# Patient Record
Sex: Male | Born: 1965
Health system: Southern US, Community
[De-identification: ages and names within clinical notes are randomized; demographics above are authoritative.]

## PROBLEM LIST (undated history)

## (undated) DIAGNOSIS — I10 Essential (primary) hypertension: Secondary | ICD-10-CM

## (undated) DIAGNOSIS — K754 Autoimmune hepatitis: Secondary | ICD-10-CM

## (undated) DIAGNOSIS — K603 Anal fistula, unspecified: Secondary | ICD-10-CM

## (undated) DIAGNOSIS — F339 Major depressive disorder, recurrent, unspecified: Secondary | ICD-10-CM

## (undated) DIAGNOSIS — R011 Cardiac murmur, unspecified: Secondary | ICD-10-CM

## (undated) DIAGNOSIS — Z87820 Personal history of traumatic brain injury: Secondary | ICD-10-CM

## (undated) DIAGNOSIS — I81 Portal vein thrombosis: Secondary | ICD-10-CM

## (undated) DIAGNOSIS — Z85828 Personal history of other malignant neoplasm of skin: Secondary | ICD-10-CM

## (undated) DIAGNOSIS — C4440 Unspecified malignant neoplasm of skin of scalp and neck: Secondary | ICD-10-CM

## (undated) DIAGNOSIS — Z860100 Personal history of colon polyps, unspecified: Secondary | ICD-10-CM

## (undated) DIAGNOSIS — L111 Transient acantholytic dermatosis [Grover]: Secondary | ICD-10-CM

## (undated) DIAGNOSIS — M8580 Other specified disorders of bone density and structure, unspecified site: Secondary | ICD-10-CM

## (undated) DIAGNOSIS — F5082 Avoidant/restrictive food intake disorder: Secondary | ICD-10-CM

## (undated) DIAGNOSIS — E531 Pyridoxine deficiency: Secondary | ICD-10-CM

## (undated) DIAGNOSIS — IMO0001 Reserved for inherently not codable concepts without codable children: Secondary | ICD-10-CM

## (undated) DIAGNOSIS — K8301 Primary sclerosing cholangitis: Secondary | ICD-10-CM

## (undated) DIAGNOSIS — K551 Chronic vascular disorders of intestine: Secondary | ICD-10-CM

## (undated) DIAGNOSIS — K9049 Malabsorption due to intolerance, not elsewhere classified: Secondary | ICD-10-CM

## (undated) DIAGNOSIS — J302 Other seasonal allergic rhinitis: Secondary | ICD-10-CM

## (undated) DIAGNOSIS — F1911 Other psychoactive substance abuse, in remission: Secondary | ICD-10-CM

## (undated) DIAGNOSIS — E7800 Pure hypercholesterolemia, unspecified: Secondary | ICD-10-CM

## (undated) DIAGNOSIS — M1990 Unspecified osteoarthritis, unspecified site: Secondary | ICD-10-CM

## (undated) DIAGNOSIS — K591 Functional diarrhea: Secondary | ICD-10-CM

## (undated) DIAGNOSIS — T7840XA Allergy, unspecified, initial encounter: Secondary | ICD-10-CM

## (undated) DIAGNOSIS — K5080 Crohn's disease of both small and large intestine without complications: Secondary | ICD-10-CM

## (undated) DIAGNOSIS — F1910 Other psychoactive substance abuse, uncomplicated: Secondary | ICD-10-CM

## (undated) DIAGNOSIS — G894 Chronic pain syndrome: Secondary | ICD-10-CM

## (undated) DIAGNOSIS — F419 Anxiety disorder, unspecified: Secondary | ICD-10-CM

## (undated) DIAGNOSIS — E509 Vitamin A deficiency, unspecified: Secondary | ICD-10-CM

## (undated) DIAGNOSIS — Z915 Personal history of self-harm: Secondary | ICD-10-CM

## (undated) DIAGNOSIS — K909 Intestinal malabsorption, unspecified: Secondary | ICD-10-CM

## (undated) DIAGNOSIS — D500 Iron deficiency anemia secondary to blood loss (chronic): Secondary | ICD-10-CM

## (undated) DIAGNOSIS — H269 Unspecified cataract: Secondary | ICD-10-CM

## (undated) DIAGNOSIS — Z8489 Family history of other specified conditions: Secondary | ICD-10-CM

## (undated) DIAGNOSIS — Z9151 Personal history of suicidal behavior: Secondary | ICD-10-CM

## (undated) DIAGNOSIS — Z9889 Other specified postprocedural states: Secondary | ICD-10-CM

## (undated) DIAGNOSIS — F1011 Alcohol abuse, in remission: Secondary | ICD-10-CM

## (undated) DIAGNOSIS — E54 Ascorbic acid deficiency: Secondary | ICD-10-CM

## (undated) HISTORY — PX: PILONIDAL CYST EXCISION: SHX744

## (undated) HISTORY — DX: Autoimmune hepatitis: K75.4

## (undated) HISTORY — DX: Other specified disorders of bone density and structure, unspecified site: M85.80

## (undated) HISTORY — PX: UPPER GASTROINTESTINAL ENDOSCOPY: SHX188

## (undated) HISTORY — DX: Other psychoactive substance abuse, in remission: F19.11

## (undated) HISTORY — DX: Crohn's disease of both small and large intestine without complications: K50.80

## (undated) HISTORY — DX: Other psychoactive substance abuse, uncomplicated: F19.10

## (undated) HISTORY — DX: Unspecified cataract: H26.9

## (undated) HISTORY — DX: Unspecified osteoarthritis, unspecified site: M19.90

## (undated) HISTORY — DX: Alcohol abuse, in remission: F10.11

## (undated) HISTORY — DX: Transient acantholytic dermatosis (grover): L11.1

## (undated) HISTORY — DX: Unspecified malignant neoplasm of skin of scalp and neck: C44.40

## (undated) HISTORY — DX: Ascorbic acid deficiency: E54

## (undated) HISTORY — PX: COLONOSCOPY: SHX174

## (undated) HISTORY — DX: Avoidant/restrictive food intake disorder: F50.82

## (undated) HISTORY — DX: Primary sclerosing cholangitis: K83.01

## (undated) HISTORY — DX: Personal history of colonic polyps: Z86.010

## (undated) HISTORY — DX: Pure hypercholesterolemia, unspecified: E78.00

## (undated) HISTORY — DX: Personal history of colon polyps, unspecified: Z86.0100

## (undated) HISTORY — PX: ADENOIDECTOMY: SHX5191

## (undated) HISTORY — DX: Portal vein thrombosis: I81

## (undated) HISTORY — DX: Chronic pain syndrome: G89.4

## (undated) HISTORY — DX: Allergy, unspecified, initial encounter: T78.40XA

## (undated) HISTORY — DX: Essential (primary) hypertension: I10

## (undated) HISTORY — PX: PERCUTANEOUS LIVER BIOPSY: SUR136

## (undated) HISTORY — DX: Anxiety disorder, unspecified: F41.9

## (undated) HISTORY — PX: FOOT SURGERY: SHX648

## (undated) HISTORY — DX: Vitamin a deficiency, unspecified: E50.9

## (undated) SURGERY — EGD (ESOPHAGOGASTRODUODENOSCOPY)
Anesthesia: Monitor Anesthesia Care

---
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
## 1995-03-02 DIAGNOSIS — F1910 Other psychoactive substance abuse, uncomplicated: Secondary | ICD-10-CM

## 1995-03-02 HISTORY — DX: Other psychoactive substance abuse, uncomplicated: F19.10

## 1999-10-13 ENCOUNTER — Encounter: Payer: Self-pay | Admitting: Gastroenterology

## 1999-10-13 ENCOUNTER — Ambulatory Visit (HOSPITAL_COMMUNITY): Admission: RE | Admit: 1999-10-13 | Discharge: 1999-10-13 | Payer: Self-pay | Admitting: Gastroenterology

## 1999-10-20 ENCOUNTER — Other Ambulatory Visit: Admission: RE | Admit: 1999-10-20 | Discharge: 1999-10-20 | Payer: Self-pay | Admitting: Gastroenterology

## 1999-10-20 ENCOUNTER — Encounter (INDEPENDENT_AMBULATORY_CARE_PROVIDER_SITE_OTHER): Payer: Self-pay

## 1999-11-13 ENCOUNTER — Encounter: Payer: Self-pay | Admitting: Gastroenterology

## 1999-11-13 ENCOUNTER — Ambulatory Visit (HOSPITAL_COMMUNITY): Admission: RE | Admit: 1999-11-13 | Discharge: 1999-11-13 | Payer: Self-pay | Admitting: Gastroenterology

## 2003-05-02 ENCOUNTER — Encounter (INDEPENDENT_AMBULATORY_CARE_PROVIDER_SITE_OTHER): Payer: Self-pay | Admitting: Gastroenterology

## 2004-06-22 ENCOUNTER — Ambulatory Visit: Payer: Self-pay | Admitting: Gastroenterology

## 2004-06-24 ENCOUNTER — Ambulatory Visit: Payer: Self-pay | Admitting: Gastroenterology

## 2004-07-08 ENCOUNTER — Ambulatory Visit: Payer: Self-pay | Admitting: Gastroenterology

## 2004-12-28 ENCOUNTER — Ambulatory Visit: Payer: Self-pay | Admitting: Gastroenterology

## 2005-01-06 ENCOUNTER — Ambulatory Visit: Payer: Self-pay | Admitting: Gastroenterology

## 2005-04-12 ENCOUNTER — Ambulatory Visit: Payer: Self-pay | Admitting: Gastroenterology

## 2005-04-14 ENCOUNTER — Ambulatory Visit: Payer: Self-pay | Admitting: Gastroenterology

## 2005-04-15 ENCOUNTER — Encounter (INDEPENDENT_AMBULATORY_CARE_PROVIDER_SITE_OTHER): Payer: Self-pay | Admitting: *Deleted

## 2005-04-15 ENCOUNTER — Ambulatory Visit (HOSPITAL_COMMUNITY): Admission: RE | Admit: 2005-04-15 | Discharge: 2005-04-15 | Payer: Self-pay | Admitting: Gastroenterology

## 2005-04-22 ENCOUNTER — Ambulatory Visit: Payer: Self-pay | Admitting: Gastroenterology

## 2005-04-26 ENCOUNTER — Ambulatory Visit: Payer: Self-pay | Admitting: Internal Medicine

## 2005-04-27 ENCOUNTER — Ambulatory Visit: Payer: Self-pay | Admitting: Internal Medicine

## 2005-05-06 ENCOUNTER — Ambulatory Visit: Payer: Self-pay | Admitting: Internal Medicine

## 2005-05-11 ENCOUNTER — Encounter (INDEPENDENT_AMBULATORY_CARE_PROVIDER_SITE_OTHER): Payer: Self-pay | Admitting: *Deleted

## 2005-05-11 ENCOUNTER — Ambulatory Visit (HOSPITAL_COMMUNITY): Admission: RE | Admit: 2005-05-11 | Discharge: 2005-05-11 | Payer: Self-pay | Admitting: Internal Medicine

## 2005-05-11 ENCOUNTER — Encounter: Payer: Self-pay | Admitting: Internal Medicine

## 2005-05-19 ENCOUNTER — Ambulatory Visit: Payer: Self-pay | Admitting: Internal Medicine

## 2005-06-10 ENCOUNTER — Ambulatory Visit: Payer: Self-pay | Admitting: Internal Medicine

## 2005-07-01 ENCOUNTER — Ambulatory Visit: Payer: Self-pay | Admitting: Internal Medicine

## 2005-08-17 ENCOUNTER — Ambulatory Visit: Payer: Self-pay | Admitting: Internal Medicine

## 2005-08-25 ENCOUNTER — Ambulatory Visit: Payer: Self-pay | Admitting: Internal Medicine

## 2005-09-08 ENCOUNTER — Ambulatory Visit: Payer: Self-pay | Admitting: Internal Medicine

## 2005-10-07 ENCOUNTER — Ambulatory Visit: Payer: Self-pay | Admitting: Internal Medicine

## 2005-11-10 ENCOUNTER — Ambulatory Visit: Payer: Self-pay | Admitting: Internal Medicine

## 2005-12-10 ENCOUNTER — Ambulatory Visit: Payer: Self-pay | Admitting: Internal Medicine

## 2005-12-10 LAB — CONVERTED CEMR LAB
Albumin: 3.3 g/dL — ABNORMAL LOW (ref 3.5–5.2)
Alkaline Phosphatase: 131 units/L — ABNORMAL HIGH (ref 39–117)
Basophils Relative: 0 % (ref 0.0–1.0)
HCT: 43.3 % (ref 39.0–52.0)
Hemoglobin: 14.7 g/dL (ref 13.0–17.0)
Lymphocytes Relative: 17 % (ref 12.0–46.0)
Monocytes Relative: 3 % (ref 3.0–11.0)
Neutrophils Relative %: 80 % — ABNORMAL HIGH (ref 43.0–77.0)
Total Bilirubin: 1.1 mg/dL (ref 0.3–1.2)

## 2005-12-23 ENCOUNTER — Ambulatory Visit: Payer: Self-pay | Admitting: Internal Medicine

## 2006-01-27 ENCOUNTER — Ambulatory Visit: Payer: Self-pay | Admitting: Internal Medicine

## 2006-01-27 LAB — CONVERTED CEMR LAB
Albumin: 3.4 g/dL — ABNORMAL LOW (ref 3.5–5.2)
Basophils Absolute: 0 10*3/uL (ref 0.0–0.1)
Bilirubin, Direct: 0.1 mg/dL (ref 0.0–0.3)
Eosinophil percent: 1.2 % (ref 0.0–5.0)
HCT: 44 % (ref 39.0–52.0)
Hemoglobin: 15.2 g/dL (ref 13.0–17.0)
Lymphocytes Relative: 17.9 % (ref 12.0–46.0)
MCHC: 34.5 g/dL (ref 30.0–36.0)
MCV: 90.3 fL (ref 78.0–100.0)
Monocytes Absolute: 0.7 10*3/uL (ref 0.2–0.7)
Neutro Abs: 6.3 10*3/uL (ref 1.4–7.7)
Neutrophils Relative %: 73 % (ref 43.0–77.0)
RBC: 4.87 M/uL (ref 4.22–5.81)

## 2006-02-17 ENCOUNTER — Ambulatory Visit: Payer: Self-pay | Admitting: Internal Medicine

## 2006-02-17 LAB — CONVERTED CEMR LAB
ALT: 82 units/L — ABNORMAL HIGH (ref 0–40)
AST: 40 units/L — ABNORMAL HIGH (ref 0–37)
Bilirubin, Direct: 0.1 mg/dL (ref 0.0–0.3)
HCT: 45.4 % (ref 39.0–52.0)
MCHC: 33.8 g/dL (ref 30.0–36.0)
MCV: 92 fL (ref 78.0–100.0)
Total Bilirubin: 1.4 mg/dL — ABNORMAL HIGH (ref 0.3–1.2)
WBC: 6.1 10*3/uL (ref 4.5–10.5)

## 2006-03-25 ENCOUNTER — Ambulatory Visit: Payer: Self-pay | Admitting: Internal Medicine

## 2006-03-25 LAB — CONVERTED CEMR LAB
ALT: 67 units/L — ABNORMAL HIGH (ref 0–40)
AST: 36 units/L (ref 0–37)
Albumin: 3.3 g/dL — ABNORMAL LOW (ref 3.5–5.2)
Bilirubin, Direct: 0.1 mg/dL (ref 0.0–0.3)
MCHC: 34.2 g/dL (ref 30.0–36.0)
MCV: 91.6 fL (ref 78.0–100.0)
RBC: 5.01 M/uL (ref 4.22–5.81)
RDW: 13.6 % (ref 11.5–14.6)
WBC: 5.5 10*3/uL (ref 4.5–10.5)

## 2006-05-02 ENCOUNTER — Ambulatory Visit: Payer: Self-pay | Admitting: Internal Medicine

## 2006-05-02 LAB — CONVERTED CEMR LAB
Basophils Relative: 0.7 % (ref 0.0–1.0)
Bilirubin, Direct: 0.1 mg/dL (ref 0.0–0.3)
Eosinophils Absolute: 0.1 10*3/uL (ref 0.0–0.6)
Eosinophils Relative: 2 % (ref 0.0–5.0)
Hemoglobin: 13.6 g/dL (ref 13.0–17.0)
Lymphocytes Relative: 30.9 % (ref 12.0–46.0)
MCV: 91.7 fL (ref 78.0–100.0)
Monocytes Absolute: 0.5 10*3/uL (ref 0.2–0.7)
Neutro Abs: 3.5 10*3/uL (ref 1.4–7.7)
Platelets: 290 10*3/uL (ref 150–400)
Total Bilirubin: 0.8 mg/dL (ref 0.3–1.2)
Total Protein: 6.1 g/dL (ref 6.0–8.3)
WBC: 6 10*3/uL (ref 4.5–10.5)

## 2006-05-05 ENCOUNTER — Ambulatory Visit: Payer: Self-pay | Admitting: Internal Medicine

## 2006-05-16 ENCOUNTER — Ambulatory Visit: Payer: Self-pay | Admitting: Internal Medicine

## 2006-05-16 LAB — CONVERTED CEMR LAB
AST: 116 units/L — ABNORMAL HIGH (ref 0–37)
Albumin: 3.2 g/dL — ABNORMAL LOW (ref 3.5–5.2)
Basophils Absolute: 0 10*3/uL (ref 0.0–0.1)
Basophils Relative: 0.4 % (ref 0.0–1.0)
CO2: 32 meq/L (ref 19–32)
Chloride: 102 meq/L (ref 96–112)
Creatinine, Ser: 1.1 mg/dL (ref 0.4–1.5)
Eosinophils Relative: 3.6 % (ref 0.0–5.0)
Glucose, Bld: 84 mg/dL (ref 70–99)
HCT: 42.4 % (ref 39.0–52.0)
Hemoglobin: 15 g/dL (ref 13.0–17.0)
Monocytes Absolute: 0.4 10*3/uL (ref 0.2–0.7)
Neutrophils Relative %: 55.1 % (ref 43.0–77.0)
Potassium: 3.7 meq/L (ref 3.5–5.1)
RBC: 4.68 M/uL (ref 4.22–5.81)
RDW: 13.2 % (ref 11.5–14.6)
Sodium: 141 meq/L (ref 135–145)
Total Bilirubin: 1.1 mg/dL (ref 0.3–1.2)
Total Protein: 6.3 g/dL (ref 6.0–8.3)
WBC: 4.7 10*3/uL (ref 4.5–10.5)

## 2006-05-31 ENCOUNTER — Ambulatory Visit: Payer: Self-pay | Admitting: Internal Medicine

## 2006-06-09 ENCOUNTER — Ambulatory Visit: Payer: Self-pay | Admitting: Internal Medicine

## 2006-06-09 LAB — CONVERTED CEMR LAB
Albumin: 3 g/dL — ABNORMAL LOW (ref 3.5–5.2)
Total Bilirubin: 0.9 mg/dL (ref 0.3–1.2)
Total Protein: 6.2 g/dL (ref 6.0–8.3)

## 2006-07-15 ENCOUNTER — Ambulatory Visit: Payer: Self-pay | Admitting: Internal Medicine

## 2006-07-15 LAB — CONVERTED CEMR LAB
ALT: 277 units/L — ABNORMAL HIGH (ref 0–40)
Albumin: 3.2 g/dL — ABNORMAL LOW (ref 3.5–5.2)
Alkaline Phosphatase: 59 units/L (ref 39–117)
Total Bilirubin: 1 mg/dL (ref 0.3–1.2)

## 2006-08-19 ENCOUNTER — Ambulatory Visit: Payer: Self-pay | Admitting: Internal Medicine

## 2006-08-19 LAB — CONVERTED CEMR LAB
ALT: 933 units/L — ABNORMAL HIGH (ref 0–40)
AST: 309 units/L — ABNORMAL HIGH (ref 0–37)
Alkaline Phosphatase: 50 units/L (ref 39–117)

## 2006-08-31 ENCOUNTER — Encounter (INDEPENDENT_AMBULATORY_CARE_PROVIDER_SITE_OTHER): Payer: Self-pay | Admitting: Interventional Radiology

## 2006-08-31 ENCOUNTER — Encounter: Payer: Self-pay | Admitting: Internal Medicine

## 2006-08-31 ENCOUNTER — Ambulatory Visit (HOSPITAL_COMMUNITY): Admission: RE | Admit: 2006-08-31 | Discharge: 2006-08-31 | Payer: Self-pay | Admitting: Internal Medicine

## 2006-09-16 ENCOUNTER — Ambulatory Visit: Payer: Self-pay | Admitting: Internal Medicine

## 2006-09-16 LAB — CONVERTED CEMR LAB
ALT: 2082 units/L — ABNORMAL HIGH (ref 0–53)
AST: 730 units/L — ABNORMAL HIGH (ref 0–37)
Albumin: 2.9 g/dL — ABNORMAL LOW (ref 3.5–5.2)
Alkaline Phosphatase: 60 units/L (ref 39–117)
Total Bilirubin: 1.3 mg/dL — ABNORMAL HIGH (ref 0.3–1.2)

## 2006-09-20 ENCOUNTER — Ambulatory Visit: Payer: Self-pay | Admitting: Internal Medicine

## 2006-09-20 LAB — CONVERTED CEMR LAB
ALT: 2563 units/L — ABNORMAL HIGH (ref 0–53)
AST: 845 units/L — ABNORMAL HIGH (ref 0–37)
Alkaline Phosphatase: 75 units/L (ref 39–117)
Beta Globulin: 5.4 % (ref 4.7–7.2)
Bilirubin, Direct: 0.1 mg/dL (ref 0.0–0.3)
Gamma Globulin: 17 % (ref 11.1–18.8)
INR: 1 (ref 0.9–2.0)
Total Bilirubin: 1.2 mg/dL (ref 0.3–1.2)
Total Protein: 6.9 g/dL (ref 6.0–8.3)

## 2006-09-23 ENCOUNTER — Encounter: Payer: Self-pay | Admitting: Internal Medicine

## 2006-09-30 ENCOUNTER — Ambulatory Visit: Payer: Self-pay | Admitting: Internal Medicine

## 2006-09-30 LAB — CONVERTED CEMR LAB
Albumin: 2.8 g/dL — ABNORMAL LOW (ref 3.5–5.2)
Anti Nuclear Antibody(ANA): NEGATIVE
Bilirubin, Direct: 0.1 mg/dL (ref 0.0–0.3)
INR: 1 (ref 0.9–2.0)
Prothrombin Time: 12.1 s (ref 10.0–14.0)

## 2006-10-17 ENCOUNTER — Ambulatory Visit: Payer: Self-pay | Admitting: Internal Medicine

## 2006-10-17 LAB — CONVERTED CEMR LAB
Albumin: 2.8 g/dL — ABNORMAL LOW (ref 3.5–5.2)
Alkaline Phosphatase: 63 units/L (ref 39–117)
INR: 0.9 (ref 0.9–2.0)
Total Bilirubin: 1.1 mg/dL (ref 0.3–1.2)

## 2006-11-04 ENCOUNTER — Ambulatory Visit: Payer: Self-pay | Admitting: Internal Medicine

## 2006-11-04 LAB — CONVERTED CEMR LAB
ALT: 535 units/L — ABNORMAL HIGH (ref 0–53)
Albumin: 2.8 g/dL — ABNORMAL LOW (ref 3.5–5.2)
Alkaline Phosphatase: 63 units/L (ref 39–117)
Total Bilirubin: 1.2 mg/dL (ref 0.3–1.2)
Total Protein: 6.1 g/dL (ref 6.0–8.3)

## 2006-12-02 ENCOUNTER — Ambulatory Visit: Payer: Self-pay | Admitting: Internal Medicine

## 2006-12-02 LAB — CONVERTED CEMR LAB
ALT: 1404 units/L — ABNORMAL HIGH (ref 0–53)
AST: 501 units/L — ABNORMAL HIGH (ref 0–37)
Albumin: 2.9 g/dL — ABNORMAL LOW (ref 3.5–5.2)
HCT: 43.3 % (ref 39.0–52.0)
Hemoglobin: 15.3 g/dL (ref 13.0–17.0)
MCHC: 35.4 g/dL (ref 30.0–36.0)
MCV: 94.4 fL (ref 78.0–100.0)
Monocytes Relative: 10 % (ref 3.0–11.0)
Neutrophils Relative %: 69 % (ref 43.0–77.0)
RBC: 4.58 M/uL (ref 4.22–5.81)
RDW: 13.8 % (ref 11.5–14.6)
Total Bilirubin: 1.2 mg/dL (ref 0.3–1.2)

## 2006-12-05 ENCOUNTER — Ambulatory Visit: Payer: Self-pay | Admitting: Internal Medicine

## 2006-12-05 LAB — CONVERTED CEMR LAB
ALT: 1599 units/L — ABNORMAL HIGH (ref 0–53)
AST: 529 units/L — ABNORMAL HIGH (ref 0–37)
Alkaline Phosphatase: 84 units/L (ref 39–117)
Bilirubin, Direct: 0.1 mg/dL (ref 0.0–0.3)
Total Bilirubin: 1.3 mg/dL — ABNORMAL HIGH (ref 0.3–1.2)
Total Protein: 6.7 g/dL (ref 6.0–8.3)

## 2006-12-08 ENCOUNTER — Ambulatory Visit: Payer: Self-pay | Admitting: Internal Medicine

## 2006-12-08 LAB — CONVERTED CEMR LAB
AST: 453 units/L — ABNORMAL HIGH (ref 0–37)
Bilirubin, Direct: 0.1 mg/dL (ref 0.0–0.3)
Total Bilirubin: 1.1 mg/dL (ref 0.3–1.2)
Total Protein: 6.7 g/dL (ref 6.0–8.3)
aPTT: 26.5 s (ref 21.7–29.8)

## 2006-12-09 ENCOUNTER — Ambulatory Visit: Payer: Self-pay | Admitting: Internal Medicine

## 2006-12-16 ENCOUNTER — Ambulatory Visit: Payer: Self-pay | Admitting: Internal Medicine

## 2006-12-16 LAB — CONVERTED CEMR LAB
Alkaline Phosphatase: 72 units/L (ref 39–117)
Basophils Relative: 0 % (ref 0.0–1.0)
Bilirubin, Direct: 0.1 mg/dL (ref 0.0–0.3)
Lymphocytes Relative: 4 % — ABNORMAL LOW (ref 12.0–46.0)
Monocytes Relative: 6 % (ref 3.0–11.0)
Platelets: 173 10*3/uL (ref 150–400)
RBC: 4.54 M/uL (ref 4.22–5.81)
RDW: 13.8 % (ref 11.5–14.6)
Total Bilirubin: 1 mg/dL (ref 0.3–1.2)
Total Protein: 6.3 g/dL (ref 6.0–8.3)
WBC: 10.9 10*3/uL — ABNORMAL HIGH (ref 4.5–10.5)

## 2007-01-05 ENCOUNTER — Ambulatory Visit: Payer: Self-pay | Admitting: Internal Medicine

## 2007-01-05 LAB — CONVERTED CEMR LAB: Creatinine, Ser: 0.8 mg/dL (ref 0.4–1.5)

## 2007-01-11 ENCOUNTER — Encounter: Admission: RE | Admit: 2007-01-11 | Discharge: 2007-01-11 | Payer: Self-pay | Admitting: Internal Medicine

## 2007-01-13 ENCOUNTER — Ambulatory Visit: Payer: Self-pay | Admitting: Internal Medicine

## 2007-01-13 LAB — CONVERTED CEMR LAB
ALT: 309 units/L — ABNORMAL HIGH (ref 0–53)
Alkaline Phosphatase: 62 units/L (ref 39–117)
Basophils Relative: 0 % (ref 0.0–1.0)
Lymphocytes Relative: 10 % — ABNORMAL LOW (ref 12.0–46.0)
Monocytes Relative: 3 % (ref 3.0–11.0)
Platelets: 146 10*3/uL — ABNORMAL LOW (ref 150–400)
RBC: 4.61 M/uL (ref 4.22–5.81)
Total Bilirubin: 1.1 mg/dL (ref 0.3–1.2)
Total Protein: 6 g/dL (ref 6.0–8.3)

## 2007-01-27 ENCOUNTER — Ambulatory Visit: Payer: Self-pay | Admitting: Internal Medicine

## 2007-01-27 LAB — CONVERTED CEMR LAB
AST: 56 units/L — ABNORMAL HIGH (ref 0–37)
Bilirubin, Direct: 0.1 mg/dL (ref 0.0–0.3)
HCT: 42.9 % (ref 39.0–52.0)
MCHC: 35.7 g/dL (ref 30.0–36.0)
Monocytes Relative: 6 % (ref 3.0–11.0)
RBC: 4.46 M/uL (ref 4.22–5.81)
RDW: 13.9 % (ref 11.5–14.6)
Total Bilirubin: 1.1 mg/dL (ref 0.3–1.2)
Total Protein: 5.7 g/dL — ABNORMAL LOW (ref 6.0–8.3)

## 2007-02-17 ENCOUNTER — Ambulatory Visit: Payer: Self-pay | Admitting: Gastroenterology

## 2007-02-17 LAB — CONVERTED CEMR LAB
ALT: 122 units/L — ABNORMAL HIGH (ref 0–53)
Alkaline Phosphatase: 50 units/L (ref 39–117)
Basophils Relative: 0 % (ref 0.0–1.0)
Eosinophils Absolute: 0.1 10*3/uL (ref 0.0–0.6)
Eosinophils Relative: 0.7 % (ref 0.0–5.0)
Platelets: 136 10*3/uL — ABNORMAL LOW (ref 150–400)
RBC: 4.13 M/uL — ABNORMAL LOW (ref 4.22–5.81)
RDW: 14.2 % (ref 11.5–14.6)
Total Bilirubin: 1.3 mg/dL — ABNORMAL HIGH (ref 0.3–1.2)
Total Protein: 5.6 g/dL — ABNORMAL LOW (ref 6.0–8.3)
WBC: 10.5 10*3/uL (ref 4.5–10.5)

## 2007-03-02 HISTORY — PX: CATARACT EXTRACTION W/ INTRAOCULAR LENS  IMPLANT, BILATERAL: SHX1307

## 2007-03-27 ENCOUNTER — Ambulatory Visit: Payer: Self-pay | Admitting: Internal Medicine

## 2007-03-27 LAB — CONVERTED CEMR LAB
ALT: 61 units/L — ABNORMAL HIGH (ref 0–53)
Bilirubin, Direct: 0.1 mg/dL (ref 0.0–0.3)
Total Protein: 5.4 g/dL — ABNORMAL LOW (ref 6.0–8.3)

## 2007-03-28 DIAGNOSIS — K5080 Crohn's disease of both small and large intestine without complications: Secondary | ICD-10-CM | POA: Insufficient documentation

## 2007-05-03 ENCOUNTER — Ambulatory Visit: Payer: Self-pay | Admitting: Internal Medicine

## 2007-05-03 LAB — CONVERTED CEMR LAB
ALT: 3424 units/L — ABNORMAL HIGH (ref 0–53)
Bilirubin, Direct: 0.3 mg/dL (ref 0.0–0.3)

## 2007-05-10 ENCOUNTER — Ambulatory Visit: Payer: Self-pay | Admitting: Internal Medicine

## 2007-05-10 LAB — CONVERTED CEMR LAB
Albumin: 2.7 g/dL — ABNORMAL LOW (ref 3.5–5.2)
Alkaline Phosphatase: 177 units/L — ABNORMAL HIGH (ref 39–117)
Total Bilirubin: 1 mg/dL (ref 0.3–1.2)

## 2007-05-18 ENCOUNTER — Ambulatory Visit: Payer: Self-pay | Admitting: Internal Medicine

## 2007-05-18 LAB — CONVERTED CEMR LAB
Albumin: 2.9 g/dL — ABNORMAL LOW (ref 3.5–5.2)
Basophils Absolute: 0.1 10*3/uL (ref 0.0–0.1)
Bilirubin, Direct: 0.1 mg/dL (ref 0.0–0.3)
Eosinophils Absolute: 0.1 10*3/uL (ref 0.0–0.6)
GFR calc Af Amer: 158 mL/min
GFR calc non Af Amer: 130 mL/min
HCT: 46.2 % (ref 39.0–52.0)
Hemoglobin: 15.2 g/dL (ref 13.0–17.0)
IgG (Immunoglobin G), Serum: 1798 mg/dL — ABNORMAL HIGH (ref 694–1618)
IgM, Serum: 126 mg/dL (ref 60–263)
Lymphocytes Relative: 12.4 % (ref 12.0–46.0)
MCHC: 32.8 g/dL (ref 30.0–36.0)
MCV: 93 fL (ref 78.0–100.0)
Monocytes Absolute: 1.2 10*3/uL — ABNORMAL HIGH (ref 0.2–0.7)
Neutro Abs: 7.5 10*3/uL (ref 1.4–7.7)
Neutrophils Relative %: 74.2 % (ref 43.0–77.0)
Potassium: 3.5 meq/L (ref 3.5–5.1)
RBC: 4.96 M/uL (ref 4.22–5.81)
Sed Rate: 26 mm/hr — ABNORMAL HIGH (ref 0–20)
Sodium: 137 meq/L (ref 135–145)
Total CK: 11 units/L (ref 7–195)

## 2007-06-28 ENCOUNTER — Encounter: Payer: Self-pay | Admitting: Internal Medicine

## 2007-06-28 ENCOUNTER — Telehealth: Payer: Self-pay | Admitting: Internal Medicine

## 2007-07-08 ENCOUNTER — Encounter: Payer: Self-pay | Admitting: Internal Medicine

## 2007-07-25 ENCOUNTER — Encounter: Payer: Self-pay | Admitting: Internal Medicine

## 2007-09-04 ENCOUNTER — Encounter: Payer: Self-pay | Admitting: Internal Medicine

## 2007-09-15 ENCOUNTER — Telehealth: Payer: Self-pay | Admitting: Internal Medicine

## 2007-09-26 ENCOUNTER — Ambulatory Visit: Payer: Self-pay | Admitting: Internal Medicine

## 2007-09-26 DIAGNOSIS — E559 Vitamin D deficiency, unspecified: Secondary | ICD-10-CM | POA: Insufficient documentation

## 2007-09-26 DIAGNOSIS — E785 Hyperlipidemia, unspecified: Secondary | ICD-10-CM | POA: Insufficient documentation

## 2007-09-28 ENCOUNTER — Telehealth: Payer: Self-pay | Admitting: Internal Medicine

## 2007-09-28 LAB — CONVERTED CEMR LAB
ALT: 91 units/L — ABNORMAL HIGH (ref 0–53)
Albumin: 3 g/dL — ABNORMAL LOW (ref 3.5–5.2)
Alkaline Phosphatase: 73 units/L (ref 39–117)
Total Protein: 6.4 g/dL (ref 6.0–8.3)

## 2008-04-29 ENCOUNTER — Ambulatory Visit: Payer: Self-pay | Admitting: Internal Medicine

## 2008-04-30 LAB — CONVERTED CEMR LAB
ALT: 108 units/L — ABNORMAL HIGH (ref 0–53)
Albumin: 3.3 g/dL — ABNORMAL LOW (ref 3.5–5.2)
Basophils Absolute: 0 10*3/uL (ref 0.0–0.1)
CO2: 31 meq/L (ref 19–32)
Calcium: 9.3 mg/dL (ref 8.4–10.5)
Chloride: 101 meq/L (ref 96–112)
GFR calc Af Amer: 135 mL/min
GFR calc non Af Amer: 112 mL/min
Glucose, Bld: 104 mg/dL — ABNORMAL HIGH (ref 70–99)
Hemoglobin: 16.3 g/dL (ref 13.0–17.0)
Lymphocytes Relative: 6.8 % — ABNORMAL LOW (ref 12.0–46.0)
Monocytes Relative: 5.5 % (ref 3.0–12.0)
Neutro Abs: 9.7 10*3/uL — ABNORMAL HIGH (ref 1.4–7.7)
Neutrophils Relative %: 87.3 % — ABNORMAL HIGH (ref 43.0–77.0)
RBC: 4.78 M/uL (ref 4.22–5.81)
RDW: 13.9 % (ref 11.5–14.6)
Sodium: 139 meq/L (ref 135–145)
Total Bilirubin: 1.3 mg/dL — ABNORMAL HIGH (ref 0.3–1.2)
Total Protein: 6.1 g/dL (ref 6.0–8.3)

## 2008-07-10 ENCOUNTER — Telehealth (INDEPENDENT_AMBULATORY_CARE_PROVIDER_SITE_OTHER): Payer: Self-pay

## 2008-08-09 ENCOUNTER — Ambulatory Visit: Payer: Self-pay | Admitting: Internal Medicine

## 2008-08-09 DIAGNOSIS — I10 Essential (primary) hypertension: Secondary | ICD-10-CM | POA: Insufficient documentation

## 2008-08-09 LAB — CONVERTED CEMR LAB
ALT: 72 units/L — ABNORMAL HIGH (ref 0–53)
AST: 30 units/L (ref 0–37)
Alkaline Phosphatase: 50 units/L (ref 39–117)
BUN: 24 mg/dL — ABNORMAL HIGH (ref 6–23)
Calcium: 9.2 mg/dL (ref 8.4–10.5)
Chloride: 107 meq/L (ref 96–112)
Creatinine, Ser: 0.7 mg/dL (ref 0.4–1.5)
Total Bilirubin: 1.2 mg/dL (ref 0.3–1.2)

## 2008-09-27 ENCOUNTER — Telehealth: Payer: Self-pay | Admitting: Internal Medicine

## 2008-10-03 ENCOUNTER — Ambulatory Visit: Payer: Self-pay | Admitting: Internal Medicine

## 2008-10-03 ENCOUNTER — Telehealth: Payer: Self-pay | Admitting: Internal Medicine

## 2008-10-03 DIAGNOSIS — L299 Pruritus, unspecified: Secondary | ICD-10-CM | POA: Insufficient documentation

## 2008-10-04 LAB — CONVERTED CEMR LAB
ALT: 308 units/L — ABNORMAL HIGH (ref 0–53)
AST: 148 units/L — ABNORMAL HIGH (ref 0–37)
Albumin: 3.3 g/dL — ABNORMAL LOW (ref 3.5–5.2)
Alkaline Phosphatase: 55 units/L (ref 39–117)
Bilirubin, Direct: 0.1 mg/dL (ref 0.0–0.3)
Total Protein: 6.3 g/dL (ref 6.0–8.3)

## 2008-10-07 ENCOUNTER — Telehealth: Payer: Self-pay | Admitting: Internal Medicine

## 2008-10-14 ENCOUNTER — Ambulatory Visit: Payer: Self-pay | Admitting: Internal Medicine

## 2008-10-15 ENCOUNTER — Telehealth: Payer: Self-pay | Admitting: Internal Medicine

## 2008-10-30 ENCOUNTER — Telehealth: Payer: Self-pay | Admitting: Internal Medicine

## 2008-10-30 ENCOUNTER — Encounter: Payer: Self-pay | Admitting: Internal Medicine

## 2008-10-31 ENCOUNTER — Ambulatory Visit: Payer: Self-pay | Admitting: Gastroenterology

## 2008-11-06 ENCOUNTER — Encounter: Admission: RE | Admit: 2008-11-06 | Discharge: 2008-11-06 | Payer: Self-pay | Admitting: Internal Medicine

## 2008-11-28 ENCOUNTER — Telehealth: Payer: Self-pay | Admitting: Internal Medicine

## 2008-12-19 ENCOUNTER — Ambulatory Visit: Payer: Self-pay | Admitting: Internal Medicine

## 2008-12-19 LAB — CONVERTED CEMR LAB
AST: 197 units/L — ABNORMAL HIGH (ref 0–37)
Alkaline Phosphatase: 107 units/L (ref 39–117)
BUN: 26 mg/dL — ABNORMAL HIGH (ref 6–23)
Eosinophils Absolute: 0 10*3/uL (ref 0.0–0.7)
Eosinophils Relative: 0.1 % (ref 0.0–5.0)
Glucose, Bld: 101 mg/dL — ABNORMAL HIGH (ref 70–99)
HCT: 48.8 % (ref 39.0–52.0)
Lymphs Abs: 0.8 10*3/uL (ref 0.7–4.0)
MCHC: 35 g/dL (ref 30.0–36.0)
MCV: 93.8 fL (ref 78.0–100.0)
Monocytes Absolute: 0.3 10*3/uL (ref 0.1–1.0)
Neutrophils Relative %: 87.5 % — ABNORMAL HIGH (ref 43.0–77.0)
Platelets: 161 10*3/uL (ref 150.0–400.0)
Sodium: 139 meq/L (ref 135–145)
Total Bilirubin: 1.2 mg/dL (ref 0.3–1.2)
WBC: 8.5 10*3/uL (ref 4.5–10.5)

## 2008-12-24 ENCOUNTER — Telehealth: Payer: Self-pay | Admitting: Internal Medicine

## 2008-12-25 ENCOUNTER — Encounter (INDEPENDENT_AMBULATORY_CARE_PROVIDER_SITE_OTHER): Payer: Self-pay

## 2008-12-26 ENCOUNTER — Telehealth: Payer: Self-pay | Admitting: Internal Medicine

## 2009-01-10 ENCOUNTER — Telehealth: Payer: Self-pay | Admitting: Internal Medicine

## 2009-01-16 ENCOUNTER — Telehealth: Payer: Self-pay | Admitting: Internal Medicine

## 2009-01-16 ENCOUNTER — Encounter (INDEPENDENT_AMBULATORY_CARE_PROVIDER_SITE_OTHER): Payer: Self-pay | Admitting: *Deleted

## 2009-02-11 ENCOUNTER — Ambulatory Visit: Payer: Self-pay | Admitting: Internal Medicine

## 2009-02-17 ENCOUNTER — Ambulatory Visit: Payer: Self-pay | Admitting: Internal Medicine

## 2009-03-13 ENCOUNTER — Encounter (INDEPENDENT_AMBULATORY_CARE_PROVIDER_SITE_OTHER): Payer: Self-pay | Admitting: *Deleted

## 2009-03-16 ENCOUNTER — Encounter: Payer: Self-pay | Admitting: Internal Medicine

## 2009-03-17 ENCOUNTER — Encounter: Payer: Self-pay | Admitting: Internal Medicine

## 2009-03-17 LAB — CONVERTED CEMR LAB
ALT: 432 units/L — ABNORMAL HIGH (ref 0–53)
AST: 344 units/L — ABNORMAL HIGH (ref 0–37)
Bilirubin, Direct: 0.2 mg/dL (ref 0.0–0.3)
Total Bilirubin: 1.3 mg/dL — ABNORMAL HIGH (ref 0.3–1.2)
Total Protein: 5.9 g/dL — ABNORMAL LOW (ref 6.0–8.3)

## 2009-03-19 ENCOUNTER — Ambulatory Visit: Payer: Self-pay | Admitting: Internal Medicine

## 2009-03-20 LAB — CONVERTED CEMR LAB
ALT: 129 units/L — ABNORMAL HIGH (ref 0–53)
Albumin: 3.3 g/dL — ABNORMAL LOW (ref 3.5–5.2)
Total Protein: 5.9 g/dL — ABNORMAL LOW (ref 6.0–8.3)

## 2009-03-24 ENCOUNTER — Encounter: Payer: Self-pay | Admitting: Internal Medicine

## 2009-03-26 ENCOUNTER — Encounter: Payer: Self-pay | Admitting: Internal Medicine

## 2009-04-10 ENCOUNTER — Telehealth (INDEPENDENT_AMBULATORY_CARE_PROVIDER_SITE_OTHER): Payer: Self-pay | Admitting: *Deleted

## 2009-04-11 ENCOUNTER — Ambulatory Visit: Payer: Self-pay | Admitting: Internal Medicine

## 2009-04-11 ENCOUNTER — Telehealth: Payer: Self-pay | Admitting: Internal Medicine

## 2009-04-13 ENCOUNTER — Encounter: Payer: Self-pay | Admitting: Internal Medicine

## 2009-05-12 LAB — CONVERTED CEMR LAB
Alkaline Phosphatase: 48 units/L (ref 39–117)
Bilirubin, Direct: 0.1 mg/dL (ref 0.0–0.3)
Total Bilirubin: 0.8 mg/dL (ref 0.3–1.2)

## 2009-05-15 ENCOUNTER — Ambulatory Visit: Payer: Self-pay | Admitting: Internal Medicine

## 2009-05-15 LAB — CONVERTED CEMR LAB
ALT: 94 units/L — ABNORMAL HIGH (ref 0–53)
AST: 37 units/L (ref 0–37)
Alkaline Phosphatase: 47 units/L (ref 39–117)
Bilirubin, Direct: 0.1 mg/dL (ref 0.0–0.3)
Total Protein: 6.3 g/dL (ref 6.0–8.3)

## 2009-05-19 ENCOUNTER — Ambulatory Visit: Payer: Self-pay | Admitting: Internal Medicine

## 2009-06-12 ENCOUNTER — Telehealth: Payer: Self-pay | Admitting: Internal Medicine

## 2009-07-16 ENCOUNTER — Ambulatory Visit: Payer: Self-pay | Admitting: Internal Medicine

## 2009-07-16 LAB — CONVERTED CEMR LAB
Albumin: 3.2 g/dL — ABNORMAL LOW (ref 3.5–5.2)
Alkaline Phosphatase: 53 units/L (ref 39–117)
Total Protein: 5.6 g/dL — ABNORMAL LOW (ref 6.0–8.3)

## 2009-07-21 ENCOUNTER — Ambulatory Visit: Payer: Self-pay | Admitting: Internal Medicine

## 2009-08-18 ENCOUNTER — Ambulatory Visit: Payer: Self-pay | Admitting: Internal Medicine

## 2009-08-19 LAB — CONVERTED CEMR LAB
ALT: 316 units/L — ABNORMAL HIGH (ref 0–53)
Basophils Absolute: 0 10*3/uL (ref 0.0–0.1)
CO2: 32 meq/L (ref 19–32)
Calcium: 8.9 mg/dL (ref 8.4–10.5)
Chloride: 102 meq/L (ref 96–112)
Creatinine, Ser: 1 mg/dL (ref 0.4–1.5)
Eosinophils Relative: 0.9 % (ref 0.0–5.0)
GFR calc non Af Amer: 85.4 mL/min (ref >60)
Hemoglobin: 14.9 g/dL (ref 13.0–17.0)
Lymphocytes Relative: 14.6 % (ref 12.0–46.0)
Monocytes Relative: 7.2 % (ref 3.0–12.0)
Neutro Abs: 7 10*3/uL (ref 1.4–7.7)
RBC: 4.62 M/uL (ref 4.22–5.81)
RDW: 14 % (ref 11.5–14.6)
Total Protein: 5.9 g/dL — ABNORMAL LOW (ref 6.0–8.3)
WBC: 9.1 10*3/uL (ref 4.5–10.5)

## 2009-08-28 ENCOUNTER — Ambulatory Visit: Payer: Self-pay | Admitting: Internal Medicine

## 2009-08-29 LAB — CONVERTED CEMR LAB: Albumin: 3.3 g/dL — ABNORMAL LOW (ref 3.5–5.2)

## 2009-09-15 ENCOUNTER — Ambulatory Visit: Payer: Self-pay | Admitting: Internal Medicine

## 2009-09-18 LAB — CONVERTED CEMR LAB
AST: 184 units/L — ABNORMAL HIGH (ref 0–37)
Alkaline Phosphatase: 83 units/L (ref 39–117)
Total Bilirubin: 0.6 mg/dL (ref 0.3–1.2)

## 2009-09-26 ENCOUNTER — Ambulatory Visit: Payer: Self-pay | Admitting: Internal Medicine

## 2010-01-13 ENCOUNTER — Telehealth: Payer: Self-pay | Admitting: Internal Medicine

## 2010-01-14 ENCOUNTER — Ambulatory Visit: Payer: Self-pay | Admitting: Internal Medicine

## 2010-01-14 ENCOUNTER — Telehealth: Payer: Self-pay | Admitting: Internal Medicine

## 2010-01-14 LAB — CONVERTED CEMR LAB
ALT: 134 units/L — ABNORMAL HIGH (ref 0–53)
Alkaline Phosphatase: 49 units/L (ref 39–117)
Basophils Absolute: 0 10*3/uL (ref 0.0–0.1)
Glucose, Bld: 117 mg/dL — ABNORMAL HIGH (ref 70–99)
HCT: 42 % (ref 39.0–52.0)
Lymphs Abs: 0.9 10*3/uL (ref 0.7–4.0)
MCV: 93.8 fL (ref 78.0–100.0)
Monocytes Absolute: 0.6 10*3/uL (ref 0.1–1.0)
Platelets: 149 10*3/uL — ABNORMAL LOW (ref 150.0–400.0)
RDW: 13.4 % (ref 11.5–14.6)
Sodium: 138 meq/L (ref 135–145)
Total Bilirubin: 0.8 mg/dL (ref 0.3–1.2)
Total Protein: 5.5 g/dL — ABNORMAL LOW (ref 6.0–8.3)

## 2010-02-27 ENCOUNTER — Ambulatory Visit: Payer: Self-pay | Admitting: Internal Medicine

## 2010-02-27 DIAGNOSIS — K649 Unspecified hemorrhoids: Secondary | ICD-10-CM | POA: Insufficient documentation

## 2010-03-03 LAB — CONVERTED CEMR LAB
BUN: 17 mg/dL (ref 6–23)
CO2: 30 meq/L (ref 19–32)
Creatinine, Ser: 0.8 mg/dL (ref 0.4–1.5)
GFR calc non Af Amer: 116.94 mL/min (ref >60.00)
Glucose, Bld: 86 mg/dL (ref 70–99)
Total Bilirubin: 0.7 mg/dL (ref 0.3–1.2)
Total Protein: 5.9 g/dL — ABNORMAL LOW (ref 6.0–8.3)

## 2010-04-02 NOTE — Letter (Signed)
Summary: Office Visit Letter  [facility]  [address] [place], [place] [address]   Phone: [phone]  Fax: [phone]      March 13, 2009 MRN: [id number]   [name] [address] [place], [place]  [address]   Dear Mr. Chiem,   According to our records, it is time for you to schedule a follow-up office visit with [place].   At your convenience, please call [phone] (option #2)to schedule an office visit. If you have any questions, concerns, or feel that this letter is in error, we would appreciate your call.   Sincerely,  [name], M.D.  [facility] [phone]

## 2010-04-02 NOTE — Progress Notes (Signed)
Summary: contact patient re: clinical update  Phone Note Outgoing Call   Summary of Call: please contact him for an update re: prednisone dose, symptoms, disability status? [name] MD, [facility]  June 12, 2009 6:19 AM   Follow-up for Phone Call        Left message for patient to call back [place], CGRN  June 12, 2009 9:22 AM  Left message for patient to call back Barb Merino RN, [facility]  June 13, 2009 9:16 AM  Patient is currently taking prednisone 20 mg prednisone.  His symptoms are unchanged.  He feels that it is "too stressful" to get below 20 mg.  His disability was denied, He is seeing Candace Apple a disability lawyer    Follow-up by: Barb Merino RN, [place],  June 13, 2009 4:08 PM  Additional Follow-up for Phone Call Additional follow up Details #1::        tell him to alternate 15 and 20 mg each day x 2 weeks, he needs REV me in June Additional Follow-up by: [name] MD, [facility],  June 15, 2009 9:41 PM    Additional Follow-up for Phone Call Additional follow up Details #2::    Dr Carlean Purl what dose should he be on after the 2 weeks?  Left message for patient to call back Barb Merino RN, CGRN  June 16, 2009 8:23 AM  Patient  notified of Dr Celesta Aver recommendations to start to alternate 15 mg with 20 mg of prednisone. I advised him I will call back after Dr Carlean Purl has a chance to review what he should be on after the 2 weeks of alternating doses.    He states he won't do  that.  He says he is not ging to go lower than 20 mg .  I asked him to please try Dr Celesta Aver suggestions and he can call us with an update.  He refuses and says he won't go lower than 20 mg.  He says his symptoms are now "worse than they have ever been".  On Friday he reported his symptoms were unchanged no better no worse.  He stated he wanted Dr Carlean Purl to now "I appreciate his suggestions, but I am not doing it".  I advised him I will notify Dr Carlean Purl and call back after he has a chance to  review. Follow-up by: Barb Merino RN, [place],  June 16, 2009 9:58 AM  Additional Follow-up for Phone Call Additional follow up Details #3:: Details for Additional Follow-up Action Taken: H needs to schedule REV in May to discuss this have him get LFT's prior to coming (a few days before) [name] MD, [facility]  June 18, 2009 9:56 AM  Left message for patient to call back Barb Merino RN, [facility]  June 18, 2009 10:03 AM  Patient ad called back and scheduled an rev with Dr Carlean Purl for 07/21/09 Additional Follow-up by: Barb Merino RN, CGRN,  June 18, 2009 2:32 PM

## 2010-04-02 NOTE — Letter (Signed)
Summary: [facility]  [facility]   Imported By: Phillis Knack 04/18/2009 13:35:15  _____________________________________________________________________  External Attachment:    Type:   Image     Comment:   External Document

## 2010-04-02 NOTE — Letter (Signed)
Summary: Social Security Letter  [facility]  [place], [place] [address]   Phone: [phone]  Fax: [phone]        April 13, 2009     [place]on Axe [facility] [address] [place], [place]  [address] [phone]   Patient Name: [name] Patient DOB: 05-26-1965 MRN: [id number] SSN: [id number] Patient Address:   [address]       [place], [place]  [address]   Dear [name]:  I have been caring for [name] in the capacity of his gastroenterologist for the past several years. He has diagnose of primary sclerosing cholangitis, autoimmune hepatitis and Crohn's disease. These problems are requiring chronic medical therapy and the primary sclerosing cholangitis is a chronic disease, usually progressive and incurable outside of a liver transplant.  It is my expert medical opinion that these chronic medical illness leave [name] in a situation where the idea of long-term employment of any kind is not possible with his current medical situation. He has significant fatigue and loss of energy related to his illnesses which would make it impossible to sustain even regular part-time work in my opinion. The likelihood is only for his situation to worsen without replacement of his liver by transplant one day, which is not possible given his current economic situation and lack of health insurance (to the best of my knowledge).  I hope that this information can be taken into consideration as part of his application for disability. Please contact me should you need further information.   Yours truly,    [name], MD, [facility]  cc: Luiz Ochoa                  Appended Document: Social Security Letter letters mailed.

## 2010-04-02 NOTE — Progress Notes (Signed)
Summary: LFT results, schedule REV  Phone Note Outgoing Call   Summary of Call: LFT's improved stay at 15 mg daily prednisone schedule REV with me before end of year [name] MD, [facility]  January 14, 2010 8:01 PM   Follow-up for Phone Call        LM to [facility] at home number [place] Deborra Medina)  January 15, 2010 10:35 AM   RC from pt.  Advised of above results/recommendations.  Pt is agreeable.  Appt scheduled for 12/30 @ 3:45pm Follow-up by: Abelino Derrick CMA Debor[facility] Medina),  January 15, 2010 11:34 AM

## 2010-04-02 NOTE — Assessment & Plan Note (Signed)
Summary: PSC, AIH, Crohn's and depression   History of Present Illness Visit Type: Follow-up Visit Primary GI MD: Silvano Rusk MD [facility] Primary Provider: Lona Kettle, MD Requesting Provider: n/a Chief Complaint: flatulence-gas History of Present Illness:   45 yo white man with [place] and suspected autoimmune hepatitis overlap, and Crohn's disease. He continues to be unemployed and uninsured. Application for disability in appeal process. Anxiety still a problem, but Prozac helping, though Some feelings of depression but no suicidal or self-harm ideation Borborygmi persists, rare diarrhea, has constipation mainly Thinks prednisone makes him sleepy            Current Medications (verified): 1)  Prednisone 20 Mg Tabs (Prednisone) .... Take 1 Tablet By Mouth Once A Day 2)  Prozac 20 Mg Caps (Fluoxetine Hcl) .... 2 Capsules Daily 3)  Calcium 600 600 Mg Tabs (Calcium Carbonate) .... Take 2 Tablets By Mouth Once Daily 4)  Vitamin D 1000 Unit Tabs (Cholecalciferol) .... Take 1 Tablet By Mouth Once A Day 5)  Vitamin C 500 Mg Tabs (Ascorbic Acid) .... Take One Tablet By Mouth Once Daily  Allergies (verified): 1)  ! * Additives & Preservatives  Past History:  Past Medical History: Reviewed history from 02/11/2009 and no changes required. CROHN'S DISEASE, SMALL AND LARGE  INTESTINE PSC, ? AUTOIMMUNE OVERLAP HYPERCHOLESTEROLEMIA, SEVERE IMMUNE TO HAV AND HBV ? INTOLERANCE OF AZATHIOPRINE/CELLCEPT ? MIGRATORY ARTHRITIS PRIOR ALCOHOL AND SUBSTANCE ABUSE in AA DEPRESSION  Past Surgical History: Reviewed history from 04/29/2008 and no changes required. cataract surgery-bilateral pylonidal cyst removal right foot surgery  Family History: Reviewed history from 04/29/2008 and no changes required. Family History of Heart Disease: Father Family History of Breast Cancer:Mother Thyroid Cancer: Father No FH of Colon Cancer:  Social History: Reviewed history from 12/19/2008 and no  changes required. Occupation: Unemployed, pursuing disability Patient is a former smoker. -stopped 10 years ago Alcohol Use - no Illicit Drug Use - no Patient does not get regular exercise.   Vital Signs:  Patient profile:   45 year old male Height:      73 inches Weight:      153 pounds BMI:     20.26 BSA:     1.92 Pulse rate:   88 / minute Pulse rhythm:   regular BP sitting:   122 / 80  (left arm) Cuff size:   regular  Vitals Entered By: Hope Pigeon CMA (April 11, 2009 2:06 PM)  Physical Exam  General:  mildly cushingoid Eyes:  anicteric Lungs:  Clear throughout to auscultation. Heart:  Regular rate and rhythm; no murmurs, rubs,  or bruits. Abdomen:  Soft, nontender and nondistended. No masses, hepatosplenomegaly or hernias noted. Normal bowel sounds to increased. ? if liver edge is just palpable below right costal margin Extremities:  no edema Psych:  mildly flat affect overall appropriate   Impression & Recommendations:  Problem # 1:  PRIMARY SCLEROSING CHOLANGITIS, ? HEPATITIS OVERLAP (ICD-576.1) AbnormalLFT's seen 8/ 2001 Marked elevation of transaminases Feb 2007, alkaline phosphatase 157, coags normal Positive anti-smooth muscle antibody at 1-80 February 2007, F. [place] antibody IgG 57 which is elevated, other serologies and celiac profile negative Liver biopsy May 11, 2005: Mild chronic hepatitis inflammation grade 1, no fibrosis identified, 2+ cirrhosis, prednisone begun with apparent response, reduction in transaminases. The patient has always had minimal symptomatology Treated with prednisone and azathioprine though azathioprine stopped by patient, trial of Cellcept stopped by patient Liver biopsy #2 August 31, 2006 chronic mildly active hepatitis with focal portal  fibrosis, iron staining negative, SPEP normal, ANA negative anti-smooth muscle antibody still positive 01/11/07 MR/MRCP right hepatic lobe ducts irregular and beaded consistent with PSC, minimal  perihepatic ascites, o/w negative Prednisone continued with recurrent flares of markedly elevated transaminases at times with minimal symptoms [facility] evaluation, Dr. Gerald Dexter April and May 2009, MRCP suggests [facility] and raises ? of cholangiocarcinoma vs fibrosis right lobe of liver, CA 19-9 87 (NL <40), follow-up at [facility] or here suggested with repeat liver biospy to be considered. MR earlier 2010 without signs of cholabgiocarcinoma. Transaminase elevations have been fairly asymptomatic and reduction in levels has not always correlated with prednisone use. Ideally would be followed and evaluated for transplant in a tertiary center but lack of insurance is prohibiting that at this time. Given drug intolerances, etc the best we can do is prednisone therapy, I think. Orders: TLB-Hepatic/Liver Function Pnl ([id number])  Problem # 2:  CROHN'S DISEASE, LARGE AND SMALL INTESTINES (ICD-555.2) Assessment: Unchanged still has gas and borborygmi, not convinced this is necessarily from Crohn's. Continue prednisone.  Problem # 3:  WEIGHT LOSS (ICD-783.21) Assessment: Deteriorated eating as much as he can he says, ? if related to depression, malabsorption, deterioration of liver disease? do not think repeat imaging needed now  Problem # 4:  DEPRESSION (ICD-311) Assessment: Improved declined to consider additional tx (mirtazipine) which could help appetitie and weight loss  Problem # 5:  STEROID USE, LONG TERM (ICD-V58.65) Assessment: Comment Only he is and has been aware of the complications including but not limited to osteoporosis, cataracts, hypertension, avascular necrosis of hips  Patient Instructions: 1)  Please go to the basement to have your lab tests drawn today.  2)  We will call you with these results and plan of further follow up. 3)  Please continue current medications.  4)  Copy sent to : C. Melinda Crutch, MD 5)  The medication list was reviewed and reconciled.  All changed / newly prescribed  medications were explained.  A complete medication list was provided to the patient / caregiver.

## 2010-04-02 NOTE — Assessment & Plan Note (Signed)
Summary: FOLLOW UP PER DR GESSNER/SP   History of Present Illness Visit Type: Follow-up Visit Primary GI MD: Silvano Rusk MD [facility] Primary Provider: Lona Kettle, MD Requesting Provider: n/a Chief Complaint: Hemrrhoids, liver disease History of Present Illness:   45 yo wm with Crohn's disease, PSC and ? autoimmune hepatitis on long-term steroids. He had some constipation problems and complains of hemrrhoids and  rectal pain. Constipation is better but hemorrhoids are still bothering him. He has tried some OTC remedies without much success.  He is tolerating 15 mg once daily of prednisone. No news on disability application. Remains uninsured.     GI Review of Systems      Denies abdominal pain, acid reflux, belching, bloating, chest pain, dysphagia with liquids, dysphagia with solids, heartburn, loss of appetite, nausea, vomiting, vomiting blood, weight loss, and  weight gain.      Reports constipation, hemorrhoids, and  rectal bleeding.     Denies anal fissure, black tarry stools, change in bowel habit, diarrhea, diverticulosis, fecal incontinence, heme positive stool, irritable bowel syndrome, jaundice, light color stool, liver problems, and  rectal pain.    Current Medications (verified): 1)  Prednisone 20 Mg  Tabs (Prednisone) .Marland Kitchen.. 1 1/4 (15 Mg) Daily 2)  Prozac 20 Mg Caps (Fluoxetine Hcl) .Marland Kitchen.. 1 Capsules Daily 3)  Calcium 600 600 Mg Tabs (Calcium Carbonate) .... Take 2 Tablets By Mouth Once Daily 4)  Vitamin D 1000 Unit Tabs (Cholecalciferol) .... Take 1 Tablet By Mouth Once A Day 5)  Vitamin C 500 Mg Tabs (Ascorbic Acid) .... Take One Tablet By Mouth Once Daily 6)  Xanax 0.5 Mg Tabs (Alprazolam) .... One Tablet By Mouth Once Daily At Bedtime 7)  Preparation H 1-0.25-14.4-15 % Crea (Pramox-Pe-Glycerin-Petrolatum) .... Use As Needed  Allergies (verified): 1)  ! * Additives & Preservatives  Past History:  Past Medical History: Last updated: 02/11/2009 CROHN'S DISEASE,  SMALL AND LARGE  INTESTINE PSC, ? AUTOIMMUNE OVERLAP HYPERCHOLESTEROLEMIA, SEVERE IMMUNE TO HAV AND HBV ? INTOLERANCE OF AZATHIOPRINE/CELLCEPT ? MIGRATORY ARTHRITIS PRIOR ALCOHOL AND SUBSTANCE ABUSE in AA DEPRESSION  Past Surgical History: Last updated: 04/29/2008 cataract surgery-bilateral pylonidal cyst removal right foot surgery  Family History: Last updated: 04/29/2008 Family History of Heart Disease: Father Family History of Breast Cancer:Mother Thyroid Cancer: Father No FH of Colon Cancer:  Social History: Last updated: 12/19/2008 Occupation: Unemployed, pursuing disability Patient is a former smoker. -stopped 10 years ago Alcohol Use - no Illicit Drug Use - no Patient does not get regular exercise.   Vital Signs:  Patient profile:   45 year old male Height:      73 inches Weight:      188.0 pounds BMI:     24.89 Pulse rate:   76 / minute Pulse rhythm:   regular BP sitting:   114 / 70  (left arm) Cuff size:   regular  Vitals Entered By: Bernita Buffy CMA [facility]) (February 27, 2010 3:49 PM)  Physical Exam  General:  mildly cushingoid Lungs:  Clear throughout to auscultation. Heart:  Regular rate and rhythm; no murmurs, rubs,  or bruits. Abdomen:  Soft, nontender and nondistended. No masses, hepatosplenomegaly or hernias noted. Normal bowel sounds to increased. ? if liver edge is just palpable below right costal margin Rectal:  normal anoderm no masses soft brown stool ANOSCOPY: small -medium hemorroids in anal canal   Impression & Recommendations:  Problem # 1:  PRIMARY SCLEROSING CHOLANGITIS, ? HEPATITIS OVERLAP (ICD-576.1)  AbnormalLFT's seen 8/ 2001 Marked elevation  of transaminases Feb 2007, alkaline phosphatase 157, coags normal Positive anti-smooth muscle antibody at 1-80 February 2007, F. Acton antibody IgG 57 which is elevated, other serologies and celiac profile negative Liver biopsy May 11, 2005: Mild chronic hepatitis inflammation grade  1, no fibrosis identified, 2+ cirrhosis, prednisone begun with apparent response, reduction in transaminases. The patient has always had minimal symptomatology Treated with prednisone and azathioprine though azathioprine stopped by patient, trial of Cellcept stopped by patient Liver biopsy #2 August 31, 2006 chronic mildly active hepatitis with focal portal fibrosis, iron staining negative, SPEP normal, ANA negative anti-smooth muscle antibody still positive 01/11/07 MR/MRCP right hepatic lobe ducts irregular and beaded consistent with PSC, minimal perihepatic ascites, o/w negative Prednisone continued with recurrent flares of markedly elevated transaminases at times with minimal symptoms [facility] evaluation, Dr. Gerald Dexter April and May 2009, MRCP suggests [facility] and raises ? of cholangiocarcinoma vs fibrosis right lobe of liver, CA 19-9 87 (NL <40), follow-up at [facility] or here suggested with repeat liver biospy to be considered. MR earlier 2010 without signs of cholabgiocarcinoma. Transaminase elevations have been fairly asymptomatic and reduction in levels has not always correlated with prednisone use. Ideally would be followed and evaluated for transplant in a tertiary center but lack of insurance is prohibiting that at this time.   1) recheck LFT's and review 2) will try to reduce prednisone to 10 mg daily after lab review  Problem # 2:  CROHN'S DISEASE, LARGE AND SMALL INTESTINES (ICD-555.2) Not that symptomatic unless constipation related. Orders: TLB-CMP (Comprehensive Metabolic Pnl) ([id number])  Problem # 3:  HEMORRHOIDS, WITH COMPLICATION (TMH-962.8) Assessment: New he says constipation has resolved mostly and I reviewed need to avoid straining will try steroid cream at this time  Problem # 4:  STEROID USE, LONG TERM (ICD-V58.65) Assessment: Unchanged trying to minimize dose glucose 117 in Nov, will follow-up  Patient Instructions: 1)  Your physician requests that you go to the basement floor of  our office to have the following labwork completed before leaving today: CMET. 2)  We have given you samples of Analpram cream to apply to hemorrhoids twiece daily until symptoms are resolved. 3)  Please schedule a follow-up appointment in 6 months. 4)  Copy sent to : Dr C. Melinda Crutch 5)  The medication list was reviewed and reconciled.  All changed / newly prescribed medications were explained.  A complete medication list was provided to the patient / caregiver.

## 2010-04-02 NOTE — Progress Notes (Signed)
Summary: Triage  Phone Note Call from Patient Call back at Home Phone [phone]   Caller: Patient Call For: [name] Reason for Call: Talk to Nurse Summary of Call: pt. has decreased his prednisone and needs to have Lab work Initial call taken by: Webb Laws,  January 13, 2010 10:07 AM  Follow-up for Phone Call        Left message for patient to call back [place], [facility]  January 13, 2010 10:47 AM  Patient  left me a voicemail that he haas decreaed his prednisone as requested and wants some lab work done.  Per Dr Carlean Purl needs CBC, LFT.  I have left a message for the patient asking him to call me back with how many mg of prednisone he is on. Follow-up by: Barb Merino RN, [place],  January 13, 2010 1:15 PM  Additional Follow-up for Phone Call Additional follow up Details #1::        Patient  returned call he has been on 15 mg prednisone for 3 weeks.  I have asked him to come for lab work. Additional Follow-up by: Barb Merino RN, [place],  January 13, 2010 3:58 PM    Additional Follow-up for Phone Call Additional follow up Details #2::    ok. CBC and CMET [name] MD, [facility]  January 13, 2010 4:24 PM

## 2010-04-02 NOTE — Assessment & Plan Note (Signed)
Summary: follow up labs/sheri   History of Present Illness Visit Type: Follow-up Visit Primary GI MD: Silvano Rusk MD [facility] Primary Provider: Lona Kettle, MD Requesting Provider: n/a Chief Complaint: Crohn's and f/u for labs  History of Present Illness:   45 yo wm here to follow-up autoimmune hepatitis/cholangitis overlap and Crohn's disease. He is waiting to hear about disability determination. "i am he same". "Hanging in there".   GI Review of Systems      Denies abdominal pain, acid reflux, belching, bloating, chest pain, dysphagia with liquids, dysphagia with solids, heartburn, loss of appetite, nausea, vomiting, vomiting blood, weight loss, and  weight gain.        Denies anal fissure, black tarry stools, change in bowel habit, constipation, diarrhea, diverticulosis, fecal incontinence, heme positive stool, hemorrhoids, irritable bowel syndrome, jaundice, light color stool, liver problems, rectal bleeding, and  rectal pain.    Current Medications (verified): 1)  Prednisone 20 Mg Tabs (Prednisone) .... Take 1 Tablet By Mouth Once A Day 2)  Prozac 20 Mg Caps (Fluoxetine Hcl) .... 2 Capsules Daily 3)  Calcium 600 600 Mg Tabs (Calcium Carbonate) .... Take 2 Tablets By Mouth Once Daily 4)  Vitamin D 1000 Unit Tabs (Cholecalciferol) .... Take 1 Tablet By Mouth Once A Day 5)  Vitamin C 500 Mg Tabs (Ascorbic Acid) .... Take One Tablet By Mouth Once Daily  Allergies (verified): 1)  ! * Additives & Preservatives  Past History:  Past Medical History: Reviewed history from 02/11/2009 and no changes required. CROHN'S DISEASE, SMALL AND LARGE  INTESTINE PSC, ? AUTOIMMUNE OVERLAP HYPERCHOLESTEROLEMIA, SEVERE IMMUNE TO HAV AND HBV ? INTOLERANCE OF AZATHIOPRINE/CELLCEPT ? MIGRATORY ARTHRITIS PRIOR ALCOHOL AND SUBSTANCE ABUSE in AA DEPRESSION  Past Surgical History: Reviewed history from 04/29/2008 and no changes required. cataract surgery-bilateral pylonidal cyst removal right  foot surgery  Family History: Reviewed history from 04/29/2008 and no changes required. Family History of Heart Disease: Father Family History of Breast Cancer:Mother Thyroid Cancer: Father No FH of Colon Cancer:  Social History: Reviewed history from 12/19/2008 and no changes required. Occupation: Unemployed, pursuing disability Patient is a former smoker. -stopped 10 years ago Alcohol Use - no Illicit Drug Use - no Patient does not get regular exercise.   Vital Signs:  Patient profile:   45 year old male Height:      73 inches Weight:      154 pounds BMI:     20.39 BSA:     1.93 Pulse rate:   88 / minute Pulse rhythm:   regular BP sitting:   120 / 80  (left arm) Cuff size:   regular  Vitals Entered By: Hope Pigeon CMA (May 19, 2009 4:37 PM)  Physical Exam  General:  mildly cushingoid Eyes:  anicteric Abdomen:  Soft, nontender and nondistended. No masses, hepatosplenomegaly or hernias noted. Normal bowel sounds to increased. ? if liver edge is just palpable below right costal margin Psych:  mildly flat affect overall appropriate   Impression & Recommendations:  Problem # 1:  PRIMARY SCLEROSING CHOLANGITIS, ? HEPATITIS OVERLAP (ICD-576.1) Assessment Unchanged AbnormalLFT's seen 8/ 2001 Marked elevation of transaminases Feb 2007, alkaline phosphatase 157, coags normal Positive anti-smooth muscle antibody at 1-80 February 2007, F. Acton antibody IgG 57 which is elevated, other serologies and celiac profile negative Liver biopsy May 11, 2005: Mild chronic hepatitis inflammation grade 1, no fibrosis identified, 2+ cirrhosis, prednisone begun with apparent response, reduction in transaminases. The patient has always had minimal symptomatology Treated with prednisone  and azathioprine though azathioprine stopped by patient, trial of Cellcept stopped by patient Liver biopsy #2 August 31, 2006 chronic mildly active hepatitis with focal portal fibrosis, iron staining  negative, SPEP normal, ANA negative anti-smooth muscle antibody still positive 01/11/07 MR/MRCP right hepatic lobe ducts irregular and beaded consistent with PSC, minimal perihepatic ascites, o/w negative Prednisone continued with recurrent flares of markedly elevated transaminases at times with minimal symptoms [facility] evaluation, Dr. Gerald Dexter April and May 2009, MRCP suggests [facility] and raises ? of cholangiocarcinoma vs fibrosis right lobe of liver, CA 19-9 87 (NL <40), follow-up at [facility] or here suggested with repeat liver biospy to be considered. MR earlier 2010 without signs of cholabgiocarcinoma. Transaminase elevations have been fairly asymptomatic and reduction in levels has not always correlated with prednisone use. Ideally would be followed and evaluated for transplant in a tertiary center but lack of insurance is prohibiting that at this time. stable overall mild transaminase elevation at this time on 20 mg predbnisone he says he cannot reduce the dose due to fears of increased Crohn's and other symptoms will wait til after disability determination  Problem # 2:  CROHN'S DISEASE, LARGE AND SMALL INTESTINES (ICD-555.2) Assessment: Improved will stay on prednisone at this time, hopefully reduce dose no other good options due to intolerances and inability to pay for other rx  Problem # 3:  DEPRESSION (ICD-311) Assessment: Improved Prozac is helping  Patient Instructions: 1)  Call [name] within 2 weks to discuss medication changes. 2)  Follow-up plans will be decided after that call. 3)  Copy sent to : C. Melinda Crutch, MD 4)  The medication list was reviewed and reconciled.  All changed / newly prescribed medications were explained.  A complete medication list was provided to the patient / caregiver.

## 2010-04-02 NOTE — Assessment & Plan Note (Signed)
Summary: PSC-AIH, Crohn's follow-up   History of Present Illness Visit Type: Follow-up Visit Primary GI MD: Silvano Rusk MD [facility] Primary Provider: Lona Kettle, MD Requesting Provider: n/a Chief Complaint: Crohn's History of Present Illness:   45 yo wm with PSC/autoimmune hepatitis and Crohn's disease, currently maintained on prednisone. he is pursuing disability. He is brighter overall, and thinks Prozac has finally begun to help him. Physically about the same with abdominla pian still - with  tightness in the RUQ. He is without  fevers/chills/infections. He has some headaches, dizziness when walking at times. Still believes he cannot go below 20 mg prednisone once daily. He is not interested in pursuing additional invasive testing or imaging studies at this time.           Current Medications (verified): 1)  Prednisone 20 Mg Tabs (Prednisone) .... Take 1 Tablet By Mouth Once A Day 2)  Prozac 20 Mg Caps (Fluoxetine Hcl) .... 2 Capsules Daily 3)  Calcium 600 600 Mg Tabs (Calcium Carbonate) .... Take 2 Tablets By Mouth Once Daily 4)  Vitamin D 1000 Unit Tabs (Cholecalciferol) .... Take 1 Tablet By Mouth Once A Day 5)  Vitamin C 500 Mg Tabs (Ascorbic Acid) .... Take One Tablet By Mouth Once Daily  Allergies (verified): 1)  ! * Additives & Preservatives  Past History:  Past Medical History: Reviewed history from 02/11/2009 and no changes required. CROHN'S DISEASE, SMALL AND LARGE  INTESTINE PSC, ? AUTOIMMUNE OVERLAP HYPERCHOLESTEROLEMIA, SEVERE IMMUNE TO HAV AND HBV ? INTOLERANCE OF AZATHIOPRINE/CELLCEPT ? MIGRATORY ARTHRITIS PRIOR ALCOHOL AND SUBSTANCE ABUSE in AA DEPRESSION  Past Surgical History: Reviewed history from 04/29/2008 and no changes required. cataract surgery-bilateral pylonidal cyst removal right foot surgery  Family History: Reviewed history from 04/29/2008 and no changes required. Family History of Heart Disease: Father Family History of Breast  Cancer:Mother Thyroid Cancer: Father No FH of Colon Cancer:  Social History: Reviewed history from 12/19/2008 and no changes required. Occupation: Unemployed, pursuing disability Patient is a former smoker. -stopped 10 years ago Alcohol Use - no Illicit Drug Use - no Patient does not get regular exercise.   Vital Signs:  Patient profile:   45 year old male Height:      73 inches Weight:      163 pounds BMI:     21.58 BSA:     1.97 Pulse rate:   88 / minute Pulse rhythm:   regular BP sitting:   126 / 82  (left arm) Cuff size:   regular  Vitals Entered By: Hope Pigeon CMA (Jul 21, 2009 3:37 PM)  Physical Exam  General:  mildly cushingoid Eyes:  anicteric Lungs:  Clear throughout to auscultation. Heart:  Regular rate and rhythm; no murmurs, rubs,  or bruits. Abdomen:  Soft, nontender and nondistended. No masses, hepatosplenomegaly or hernias noted. Normal bowel sounds to increased. ? if liver edge is just palpable below right costal margin Psych:  Alert and cooperative. Normal mood and affect.   Impression & Recommendations:  Problem # 1:  PRIMARY SCLEROSING CHOLANGITIS, ? HEPATITIS OVERLAP (ICD-576.1) Assessment Unchanged AbnormalLFT's seen 8/ 2001 Marked elevation of transaminases Feb 2007, alkaline phosphatase 157, coags normal Positive anti-smooth muscle antibody at 1-80 February 2007, F. Acton antibody IgG 57 which is elevated, other serologies and celiac profile negative Liver biopsy May 11, 2005: Mild chronic hepatitis inflammation grade 1, no fibrosis identified, 2+ cirrhosis, prednisone begun with apparent response, reduction in transaminases. The patient has always had minimal symptomatology Treated with prednisone and  azathioprine though azathioprine stopped by patient, trial of Cellcept stopped by patient Liver biopsy #2 August 31, 2006 chronic mildly active hepatitis with focal portal fibrosis, iron staining negative, SPEP normal, ANA negative anti-smooth muscle  antibody still positive 01/11/07 MR/MRCP right hepatic lobe ducts irregular and beaded consistent with PSC, minimal perihepatic ascites, o/w negative Prednisone continued with recurrent flares of markedly elevated transaminases at times with minimal symptoms [facility] evaluation, Dr. Gerald Dexter April and May 2009, MRCP suggests [facility] and raises ? of cholangiocarcinoma vs fibrosis right lobe of liver, CA 19-9 87 (NL <40), follow-up at [facility] or here suggested with repeat liver biospy to be considered. MR earlier 2010 without signs of cholabgiocarcinoma. Transaminase elevations have been fairly asymptomatic and reduction in levels has not always correlated with prednisone use. Ideally would be followed and evaluated for transplant in a tertiary center but lack of insurance is prohibiting that at this time. He is stable overall though his transaminases have doubled in past month. [place] prednisone 20 mg once daily with recheck of labs 1 month from last. CBC and CMET.  Problem # 2:  CROHN'S DISEASE, LARGE AND SMALL INTESTINES (ICD-555.2) Assessment: Unchanged  will stay on prednisone at this time, hopefully reduce dose no other good options due to intolerances and inability to pay for other rx  Problem # 3:  STEROID USE, LONG TERM (ICD-V58.65) Assessment: Unchanged  Problem # 4:  WEIGHT LOSS (ICD-783.21) Assessment: Improved suspect the improvement correlates with improvement in depression weight 154 to 163 # since last visit in March  Problem # 5:  PRURITUS (ICD-698.9) Assessment: Improved not having now  Patient Instructions: 1)  Please pick up your medications at your pharmacy.  2)  Please continue current medications.  3)  Please return for labs on 08/16/09. 4)  Follow up will be arranged after labs have been reviewed. 5)  Copy sent to : C. Melinda Crutch, MD 6)  The medication list was reviewed and reconciled.  All changed / newly prescribed medications were explained.  A complete medication list was  provided to the patient / caregiver. Prescriptions: PREDNISONE 20 MG TABS (PREDNISONE) Take 1 tablet by mouth once a day  #30 x 5   Entered by:   Abelino Derrick CMA ([place])   Authorized by:   [name] MD, [facility]   Signed by:   Abelino Derrick CMA ([place]) on 07/21/2009   Method used:   Electronically to        [facility] [id number] (retail)       [address]       [place], [place]  [address]       Ph: [phone] or [phone]       Fax: [phone]   RxID:   [id number]

## 2010-04-02 NOTE — Assessment & Plan Note (Signed)
Summary: yearly check up...em   History of Present Illness Visit Type: Follow-up Visit Primary GI MD: Silvano Rusk MD [facility] Primary Provider: Lona Kettle, MD Requesting Provider: n/a Chief Complaint: Crohn's History of Present Illness:   45 yo wm with Crohn's disease, PSC and ? of hepatitis overlap. He is uninsured and intolerant of multiple therapies and has been on chronic prednisone, and has been repeatedly advised of side effects and potential complications of steroids. He feels better overall lately. less pressure in right side psychologically better awaiting determination re: disability   GI Review of Systems      Denies abdominal pain, acid reflux, belching, bloating, chest pain, dysphagia with liquids, dysphagia with solids, heartburn, loss of appetite, nausea, vomiting, vomiting blood, weight loss, and  weight gain.        Denies anal fissure, black tarry stools, change in bowel habit, constipation, diarrhea, diverticulosis, fecal incontinence, heme positive stool, hemorrhoids, irritable bowel syndrome, jaundice, light color stool, liver problems, rectal bleeding, and  rectal pain.    Current Medications (verified): 1)  Prednisone 20 Mg Tabs (Prednisone) .... Take 1 Tablet By Mouth Once A Day 2)  Prozac 20 Mg Caps (Fluoxetine Hcl) .Marland Kitchen.. 1 Capsules Daily 3)  Calcium 600 600 Mg Tabs (Calcium Carbonate) .... Take 2 Tablets By Mouth Once Daily 4)  Vitamin D 1000 Unit Tabs (Cholecalciferol) .... Take 1 Tablet By Mouth Once A Day 5)  Vitamin C 500 Mg Tabs (Ascorbic Acid) .... Take One Tablet By Mouth Once Daily 6)  Xanax 0.5 Mg Tabs (Alprazolam) .... One Tablet By Mouth Once Daily At Bedtime  Allergies (verified): 1)  ! * Additives & Preservatives  Past History:  Past Medical History: Reviewed history from 02/11/2009 and no changes required. CROHN'S DISEASE, SMALL AND LARGE  INTESTINE PSC, ? AUTOIMMUNE OVERLAP HYPERCHOLESTEROLEMIA, SEVERE IMMUNE TO HAV AND HBV ?  INTOLERANCE OF AZATHIOPRINE/CELLCEPT ? MIGRATORY ARTHRITIS PRIOR ALCOHOL AND SUBSTANCE ABUSE in AA DEPRESSION  Past Surgical History: Reviewed history from 04/29/2008 and no changes required. cataract surgery-bilateral pylonidal cyst removal right foot surgery  Family History: Reviewed history from 04/29/2008 and no changes required. Family History of Heart Disease: Father Family History of Breast Cancer:Mother Thyroid Cancer: Father No FH of Colon Cancer:  Social History: Reviewed history from 12/19/2008 and no changes required. Occupation: Unemployed, pursuing disability Patient is a former smoker. -stopped 10 years ago Alcohol Use - no Illicit Drug Use - no Patient does not get regular exercise.   Review of Systems       still gets dizzy and has ear pressure problems and also has right knee pain that he links to the ear sxs not so much pain as it is "weak"  Vital Signs:  Patient profile:   45 year old male Height:      73 inches Weight:      169 pounds BMI:     22.38 BSA:     2.00 Pulse rate:   76 / minute Pulse rhythm:   regular BP sitting:   122 / 84  (left arm) Cuff size:   regular  Vitals Entered By: Hope Pigeon CMA (September 26, 2009 3:43 PM)  Physical Exam  General:  mildly cushingoid Eyes:  anicteric periorbital xanthellasma Lungs:  Clear throughout to auscultation. Heart:  Regular rate and rhythm; no murmurs, rubs,  or bruits. Abdomen:  Soft, nontender and nondistended. No masses, hepatosplenomegaly or hernias noted. Normal bowel sounds to increased. ? if liver edge is just palpable below right costal  margin Psych:  Alert and cooperative. Normal mood and affect.   Impression & Recommendations:  Problem # 1:  PRIMARY SCLEROSING CHOLANGITIS, ? HEPATITIS OVERLAP (ICD-576.1)  AbnormalLFT's seen 8/ 2001 Marked elevation of transaminases Feb 2007, alkaline phosphatase 157, coags normal Positive anti-smooth muscle antibody at 1-80 February 2007, F. Acton  antibody IgG 57 which is elevated, other serologies and celiac profile negative Liver biopsy May 11, 2005: Mild chronic hepatitis inflammation grade 1, no fibrosis identified, 2+ cirrhosis, prednisone begun with apparent response, reduction in transaminases. The patient has always had minimal symptomatology Treated with prednisone and azathioprine though azathioprine stopped by patient, trial of Cellcept stopped by patient Liver biopsy #2 August 31, 2006 chronic mildly active hepatitis with focal portal fibrosis, iron staining negative, SPEP normal, ANA negative anti-smooth muscle antibody still positive 01/11/07 MR/MRCP right hepatic lobe ducts irregular and beaded consistent with PSC, minimal perihepatic ascites, o/w negative Prednisone continued with recurrent flares of markedly elevated transaminases at times with minimal symptoms [facility] evaluation, Dr. Gerald Dexter April and May 2009, MRCP suggests [facility] and raises ? of cholangiocarcinoma vs fibrosis right lobe of liver, CA 19-9 87 (NL <40), follow-up at [facility] or here suggested with repeat liver biospy to be considered. MR earlier 2010 without signs of cholabgiocarcinoma. Transaminase elevations have been fairly asymptomatic and reduction in levels has not always correlated with prednisone use. Ideally would be followed and evaluated for transplant in a tertiary center but lack of insurance is prohibiting that at this time.   LFT's up again and as typical, he feels better. There has been no correlation with steroid dose and these. he will continue prednisone, try to reduce dose. Hope for disability and insurance and then repeat tertiary referral.  Problem # 2:  CROHN'S DISEASE, LARGE AND SMALL INTESTINES (ICD-555.2) Assessment: Improved on prednisone due to intolerances of other therapies.  Problem # 3:  STEROID USE, LONG TERM (ICD-V58.65) Assessment: Unchanged  Problem # 4:  DEPRESSION (ICD-311) Assessment: Improved Prozac and living with his parents  has helped.  Patient Instructions: 1)  Try to walk every day. 2)  Please schedule a follow-up appointment in 4 months.  3)  Reduce prednisone to 15 mg a day if possible. 4)  Come back to this office (basement) for labs (liver tests) on August  22. 5)  Copy sent to : C. Melinda Crutch, MD 6)  The medication list was reviewed and reconciled.  All changed / newly prescribed medications were explained.  A complete medication list was provided to the patient / caregiver.

## 2010-04-02 NOTE — Progress Notes (Signed)
Summary: disability ppwk  Phone Note Call from Patient Call back at [phone]   Caller: Patient Call For: [name] Reason for Call: Talk to Nurse Summary of Call: pt has the information that [name] needs for sending in disability papers: [place] [facility] [address] [place], [place]  [address] [phone] Initial call taken by: Lucien Mons,  April 11, 2009 4:02 PM  Follow-up for Phone Call        see letter and mail (on desk) plus cc Mr. Buckner I hand-signed a copy Follow-up by: [name] MD, Marval Regal,  April 13, 2009 11:09 AM  Additional Follow-up for Phone Call Additional follow up Details #1::        letters mailed. Additional Follow-up by: Abelino Derrick CMA Deborra Medina),  April 14, 2009 3:24 PM

## 2010-04-02 NOTE — Progress Notes (Signed)
Summary: Records request from [employer]  Request for records received from [employer]. Request forwarded to [employer]. [name]  April 10, 2009 3:45 PM

## 2010-04-02 NOTE — Letter (Signed)
Summary: Consult/[place] [facility]  Consult/[place] [facility]   Imported By: Phillis Knack 04/18/2009 13:34:03  _____________________________________________________________________  External Attachment:    Type:   Image     Comment:   External Document

## 2010-04-02 NOTE — Letter (Signed)
Summary: Disability Determination Services  Disability Determination Services   Imported By: Phillis Knack 04/18/2009 13:38:28  _____________________________________________________________________  External Attachment:    Type:   Image     Comment:   External Document

## 2010-04-03 ENCOUNTER — Telehealth (INDEPENDENT_AMBULATORY_CARE_PROVIDER_SITE_OTHER): Payer: Self-pay | Admitting: *Deleted

## 2010-04-03 NOTE — Letter (Signed)
Summary: Salomon Fick MD  Salomon Fick MD   Imported By: Phillis Knack 04/18/2009 13:36:15  _____________________________________________________________________  External Attachment:    Type:   Image     Comment:   External Document

## 2010-04-08 ENCOUNTER — Other Ambulatory Visit: Payer: Self-pay | Admitting: Internal Medicine

## 2010-04-08 ENCOUNTER — Other Ambulatory Visit: Payer: Self-pay

## 2010-04-08 ENCOUNTER — Encounter (INDEPENDENT_AMBULATORY_CARE_PROVIDER_SITE_OTHER): Payer: Self-pay | Admitting: *Deleted

## 2010-04-08 DIAGNOSIS — R7401 Elevation of levels of liver transaminase levels: Secondary | ICD-10-CM

## 2010-04-08 DIAGNOSIS — R740 Nonspecific elevation of levels of transaminase and lactic acid dehydrogenase [LDH]: Secondary | ICD-10-CM

## 2010-04-08 LAB — HEPATIC FUNCTION PANEL
AST: 64 U/L — ABNORMAL HIGH (ref 0–37)
Alkaline Phosphatase: 55 U/L (ref 39–117)
Bilirubin, Direct: 0.1 mg/dL (ref 0.0–0.3)
Total Bilirubin: 0.3 mg/dL (ref 0.3–1.2)

## 2010-04-08 NOTE — Progress Notes (Signed)
Summary: lab reminder  Phone Note Outgoing Call   Call placed by: Christian Mate CMA Deborra Medina),  April 03, 2010 12:32 PM Summary of Call: pt called and reminded to have labs done. Initial call taken by: Christian Mate CMA Deborra Medina),  April 03, 2010 12:33 PM

## 2010-05-07 ENCOUNTER — Other Ambulatory Visit: Payer: Self-pay

## 2010-05-12 ENCOUNTER — Other Ambulatory Visit: Payer: Self-pay | Admitting: Internal Medicine

## 2010-05-12 ENCOUNTER — Encounter (INDEPENDENT_AMBULATORY_CARE_PROVIDER_SITE_OTHER): Payer: Self-pay | Admitting: *Deleted

## 2010-05-12 ENCOUNTER — Other Ambulatory Visit: Payer: Self-pay

## 2010-05-12 DIAGNOSIS — R740 Nonspecific elevation of levels of transaminase and lactic acid dehydrogenase [LDH]: Secondary | ICD-10-CM

## 2010-05-12 DIAGNOSIS — R7401 Elevation of levels of liver transaminase levels: Secondary | ICD-10-CM

## 2010-05-12 LAB — HEPATIC FUNCTION PANEL
ALT: 181 U/L — ABNORMAL HIGH (ref 0–53)
AST: 96 U/L — ABNORMAL HIGH (ref 0–37)
Total Bilirubin: 0.6 mg/dL (ref 0.3–1.2)
Total Protein: 5.3 g/dL — ABNORMAL LOW (ref 6.0–8.3)

## 2010-06-15 ENCOUNTER — Other Ambulatory Visit: Payer: Self-pay | Admitting: Internal Medicine

## 2010-06-15 ENCOUNTER — Other Ambulatory Visit: Payer: Self-pay

## 2010-06-15 DIAGNOSIS — R7401 Elevation of levels of liver transaminase levels: Secondary | ICD-10-CM

## 2010-06-18 ENCOUNTER — Telehealth: Payer: Self-pay

## 2010-06-18 NOTE — Telephone Encounter (Signed)
Message copied by Barb Merino on Thu Jun 18, 2010  3:57 PM ------      Message from: Barb Merino      Created: Wed May 13, 2010 11:13 AM       Needs labs in one month.  See append on labs from 3/13

## 2010-06-18 NOTE — Telephone Encounter (Signed)
I have left a voicemail for the patient that he is due for repeat lab work and needs to come this or next week

## 2010-06-22 ENCOUNTER — Other Ambulatory Visit (INDEPENDENT_AMBULATORY_CARE_PROVIDER_SITE_OTHER): Payer: Self-pay

## 2010-06-22 DIAGNOSIS — R7401 Elevation of levels of liver transaminase levels: Secondary | ICD-10-CM

## 2010-06-22 DIAGNOSIS — R7402 Elevation of levels of lactic acid dehydrogenase (LDH): Secondary | ICD-10-CM

## 2010-06-22 DIAGNOSIS — R740 Nonspecific elevation of levels of transaminase and lactic acid dehydrogenase [LDH]: Secondary | ICD-10-CM

## 2010-06-22 LAB — HEPATIC FUNCTION PANEL
ALT: 530 U/L — ABNORMAL HIGH (ref 0–53)
Total Bilirubin: 1 mg/dL (ref 0.3–1.2)

## 2010-06-23 NOTE — Progress Notes (Signed)
Left message for patient to call back  

## 2010-06-24 ENCOUNTER — Telehealth: Payer: Self-pay

## 2010-06-24 DIAGNOSIS — R7401 Elevation of levels of liver transaminase levels: Secondary | ICD-10-CM

## 2010-06-24 DIAGNOSIS — R944 Abnormal results of kidney function studies: Secondary | ICD-10-CM

## 2010-06-24 NOTE — Telephone Encounter (Signed)
Patient advised.

## 2010-06-24 NOTE — Telephone Encounter (Signed)
Observe (at home) repeat labs as planned Stay off the weights

## 2010-06-24 NOTE — Progress Notes (Signed)
Results reviewed see further documentation on phone note from 06/24/10

## 2010-06-24 NOTE — Telephone Encounter (Signed)
Message copied by Barb Merino on Wed Jun 24, 2010  9:41 AM ------      Message from: Silvano Rusk      Created: Tue Jun 23, 2010 12:18 PM       LFT's up again      Is he ok?      If so I want to repeat in 1 week and no change in meds      If feeling bad let me know

## 2010-06-24 NOTE — Telephone Encounter (Signed)
I spoke with the patient, he states he was feeling good so he started working out and lifting weights.  He has noticed some fullness and pressure in his RUQ since.  He has stopped as of yesterday.  I have advised him he needs LFT's in 1 week, to remain on same meds until he hears back from me.  Dr Carlean Purl please advise.

## 2010-06-29 ENCOUNTER — Other Ambulatory Visit (INDEPENDENT_AMBULATORY_CARE_PROVIDER_SITE_OTHER): Payer: Self-pay

## 2010-06-29 DIAGNOSIS — R7401 Elevation of levels of liver transaminase levels: Secondary | ICD-10-CM

## 2010-06-29 DIAGNOSIS — R740 Nonspecific elevation of levels of transaminase and lactic acid dehydrogenase [LDH]: Secondary | ICD-10-CM

## 2010-06-29 LAB — HEPATIC FUNCTION PANEL
ALT: 410 U/L — ABNORMAL HIGH (ref 0–53)
AST: 184 U/L — ABNORMAL HIGH (ref 0–37)
Albumin: 2.9 g/dL — ABNORMAL LOW (ref 3.5–5.2)
Alkaline Phosphatase: 75 U/L (ref 39–117)
Total Protein: 6 g/dL (ref 6.0–8.3)

## 2010-06-30 ENCOUNTER — Telehealth: Payer: Self-pay

## 2010-06-30 DIAGNOSIS — R7401 Elevation of levels of liver transaminase levels: Secondary | ICD-10-CM

## 2010-06-30 NOTE — Progress Notes (Signed)
Quick Note:  Labs better No med changes Repeat LFT's 2 weeks See me in 3-4 weeks, sooner if not doing well but I believe he is not very symptomatic ______

## 2010-06-30 NOTE — Telephone Encounter (Signed)
Message copied by Barb Merino on Tue Jun 30, 2010  4:49 PM ------      Message from: Silvano Rusk      Created: Tue Jun 30, 2010  3:38 PM       Labs better      No med changes      Repeat LFT's 2 weeks      See me in 3-4 weeks, sooner if not doing well but I believe he is not very symptomatic

## 2010-06-30 NOTE — Telephone Encounter (Signed)
Patient advised of all the above.  REV 08/10/10 1:30.  Labs entered for 07/14/10

## 2010-07-14 ENCOUNTER — Other Ambulatory Visit (INDEPENDENT_AMBULATORY_CARE_PROVIDER_SITE_OTHER): Payer: Self-pay

## 2010-07-14 DIAGNOSIS — R7401 Elevation of levels of liver transaminase levels: Secondary | ICD-10-CM

## 2010-07-14 LAB — HEPATIC FUNCTION PANEL: Albumin: 3 g/dL — ABNORMAL LOW (ref 3.5–5.2)

## 2010-07-14 NOTE — Assessment & Plan Note (Signed)
[place] OFFICE NOTE   NAME:[name], [name] RILING                  MRN:          [id number]  DATE:05/18/2007                            DOB:          [birth year]    CHIEF COMPLAINT:  Followup of autoimmune hepatitis, question  cholangitis.   [name] was noted to have marked elevation in his transaminases on  routine lab followup.  In early March,  on the 4th, his AST was 2043,  ALT 3424, alk-phos 193, his bilirubin was 1.3.  Albumin 2.7.  His PT-INR  was normal.  He went back on 40 mg of prednisone at my direction; he was  on a lower dose prior to that, and that was actually after we rechecked  and found similar results on his last essentially identical.  In  December, he had had an AST of 42, ALT of 122, a normal alk-phos.  He  brings good news in that he does not have a preexisting condition with  his new job at [employer].  As usual, he feels okay.  I had tried  him on CELL-CEPT and MADE HIS FEET GO NUMB.  He had an INTOLERANCE TO  AZATHIOPRINE as well.  I had been wanting to refer him to a tertiary  center, but he was concerned about the possibility of a preexisting  condition, though previously when he did have an appointment he  cancelled it.  From talking to him, I think he was concerned that I was  not going to care for him at all anymore.   Today, he is most concerned about borborygmi and sort of a pulling or a  dripping sensation in his right upper quadrant.  He feels like he gets  blocked there at times though he does not have constipation or nausea or  vomiting.  This certainly could be his Crohn's disease.  He does not use  any supplements or anything outside of what we will list below, i.e., no  large doses of Vitamin A or anything.  He is not having fevers or chills  or rectal bleeding.  His weight has been stable it seems with a weight  of 148 pounds and that is down from January  of '09 when he was 162 in  October as well.  He was 171 in April of '08.  Though he has been losing  weight.  He is not reporting fever or chills or jaundice.  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  This time, the left knee and  Achilles' tendon flared up, he saw [name] Organ, MD, at [facility] and  had what sounds like a Solu-Medrol injection.  He was also given some  MOBIC and HYDROCODONE but he says HIS SYSTEM WOULD NOT TOLERATE THAT.  He is not drinking alcohol.  The new job is going okay, he says.  He is  a former smoker.   MEDICATIONS:  1. Prednisone 40 mg daily.  2. Vitamin C 2000 mg daily.  3. Calcium with Vitamin D daily.   No allergies though HE THINKS CEPHALEXIN UPSETS HIS CROHN'S DISEASE.  PHYSICAL EXAMINATION:  VITAL SIGNS:  He is 6 feet 1, weight 148 pounds,  pulse 72, blood pressure 126/80.  GENERAL:  He is well-developed and well-nourished, he is thin, he does  not look unwell.  HEENT:  He has persistent xanthelasma under the eyes.  They look a  little more prominent today.  LUNGS:  Clear.  HEART:  S1 and S2, no rubs, murmurs, or gallops.  ABDOMEN:  Soft and nontender without hepatosplenomegaly or mass.  SKIN:  No acute rash, no stigmata of chronic liver disease.   The joints look normal to me today.   ASSESSMENT:  1. Autoimmune hepatitis, which has been proven by biopsy, question      overlap syndrome with primary sclerosing cholangitis.  This is      clearly a confusing picture in he has high levels of transaminases      that occur when he comes down on his prednisone and respond to      prednisone.  He is POSSIBLY INTOLERANT TO AZATHIOPRINE and CELL-      CEPT, at least in his mind he is so in effect he is.  2. History of ileocolonic Crohn's disease, which has been relatively      quiet and question if this is related to his right upper quadrant      symptoms versus the liver.  3. An intermittent migratory arthritis of unclear etiology.    PLAN:  1. Continue prednisone 40 mg daily.  2. Labs to check a CPK, immunoglobulin-G, immunoglobulin-M, CBC, CMET,      sed rate, C-reactive protein, and a 25-hydroxy Vitamin D level.  3. I still think tertiary evaluationuseful, but he really does not      want to do that right now.  4. Will schedule a colonoscopy to reassess his Crohn's disease and      these symptoms.  5. An ERCP very well may be indicated as well to more define his      biliary anatomy.  A repeat liver biopsy could be indicated.  6. Will follow up on the weight loss.  Perhaps when he was on higher      doses of prednisone last year, his weight was up and that is why he      had lost some.  However, it could be something more serious.     [name], MD,FACG  Electronically Signed    CEG/MedQ  DD: 05/18/2007  DT: 05/18/2007  Job #: [id number]   cc:   C. Melinda Crutch, M.D.

## 2010-07-14 NOTE — Assessment & Plan Note (Signed)
[place] OFFICE NOTE   NAME:[name] [name] KUNATH                  MRN:          [id number]  DATE:12/09/2006                            DOB:          02-06-1966    CHIEF COMPLAINT:  Followup of autoimmune hepatitis.  The patient also  has a diagnosis of Crohn's colitis.   [name] [name] enzymes went up again.  We treated him with prednisone.  His enzymes came down, but he had followup labs as part of a routine.  He was on prednisone at 20 mg a day.  His followup labs on October 3  showed a normal CBC, but his transaminases were 501 AST and 1404 ALT  with an albumin of 2.9.  His prednisone was increased to 40 mg a day by  [name] [name] in my absence.  He had coags checked, these were normal.  He  had repeat labs, 453 and 1571 for the transaminases on October 9.  On  October 6, actually, he had 529 and 1599 for his transaminases.  His  bilirubin was trivially elevated at that time.  His Protime was normal.  As always, he seems to feel fine, though he did have a spell of diarrhea  and some myalgias, and some mild fevers during the period of time these  labs were taken.  He notes that whenever he goes below 30 mg of  prednisone he seems to get into trouble.  I would note that he is  hepatitis A and B immune.  He had been on supplements in the past, but  not anymore.  He is back on azathioprine.  He was instructed to do that  at a dose of 50 mg daily.  I think he has done that, but need to confirm  and he has left the office.   MEDICATIONS:  Listed and reviewed in the chart, otherwise.   PHYSICAL EXAMINATION:  Reveals a well-developed, thin white man in no  acute distress, appearing stated age.  He has vital signs of weight 162 pounds, pulse 78, blood pressure  120/78.  There is xanthelasma under the eyes as before.  LUNGS:  Clear.  HEART:  S1, S2.  ABDOMEN:  Soft, no hepatosplenomegaly or mass.  Nontender.  LOWER EXTREMITIES:  Free of edema.  There is no stigmata of chronic liver disease on the trunk.   Note, he does not have chronic symptoms of inflammatory bowel disease,  he had this spell of diarrhea that sounds like it was an infection.   ASSESSMENT:  1. Autoimmune hepatitis, worse with lower dose of prednisone.      Unfortunately, I though he was on his azathioprine.  He may not be,      we will clarify that.  He needs to get back on that if not.  2. Recent spell of diarrhea and, perhaps, viral gastroenteritis that      may have had some effect on his liver function tests.  3. Diagnosis of Crohn's colitis made in 2001.  Minimal changes on      colonoscopy, 2005.  Currently on Pentasa.  He raises questions of  whether that is the best treatment.  We may consider changing that      in the future, we will not do so now.   Further followup pending the above.  Note, he had an appointment to see  [name] [name] at [facility], but elected to defer that at this time since he had  been improving.  The confusing thing to me has been he has always looked  well, but his numbers have been bad.  He does have some fibrosis on  liver biopsy.  I am starting to wonder if he could have primary  sclerosing cholangitis, though usually patients have an elevated  alkaline phosphatase in that situation, and he does not seem to.  An  MRI/MRCP could be in order.   Note, he is also trying to get an individual insurance policy as his  [name] [name] is about to lapse.  He is told as long as he has had continuous  coverage, he will not have a preexisting condition.  He also has a lead  on a job at [employer] in [place] and is hopeful for that.     [name] Mayer, MD,FACG  Electronically Signed    CEG/MedQ  DD: 12/09/2006  DT: 12/10/2006  Job #: [id number]

## 2010-07-14 NOTE — Assessment & Plan Note (Signed)
[place] OFFICE NOTE   NAME:Goering, TYHIR SCHWAN                  MRN:          [id number]  DATE:09/20/2006                            DOB:          [birth year]    CHIEF COMPLAINT:  Follow up of autoimmune hepatitis.   Mr. Liz enzymes have been going up again.  I initially stopped  his Azathioprine because I thought maybe that was the culprit.  In  March, AST was 116, ALT 193.  I stopped his Azathioprine and they stayed  up at 166 to 286 with normal bilirubin and alkaline phosphatase in  April.  We started him on prednisone at 20 mg daily, and they were sort  of stable at 138 and 277.  They slightly increased in May, then they  started to rise further on August 19, 2006 to 309 and 933.  Total protein  5.8, albumin 2.9.  I had him do another liver biopsy which again showed  mild lymphoplasmacytic infiltrates in the portal tracts with some  fibrosis which was new from last year.  That was on August 31, 2006.  He  has stopped yogurt.  He stopped betaine hydrochloride supplement and  alpha lipoic acid and amino acid supplements.  I am not sure the  quantities he was taking.  He is convinced that stopping yogurt has made  a world of difference.  His inflammatory bowel disease is not very  symptomatic at this time.  Labs from September 16, 2006 show a bilirubin of  1.3, AST of 730, ALT 2082 with total protein of 6.3, albumin 2.9.  Essentially, he does feel well.  His weight of 164 is down from 171 in  April.   MEDICATIONS:  Medications listed and reviewed in the chart but include:  1. Pentasa 1000 mg t.i.d.  2. Prednisone 40 mg daily.  3. Vitamin C.  4. Calcium with minerals daily.  5. Benadryl p.r.n.  6. Gas-X p.r.n.   ASSESSMENT:  Autoimmune hepatitis.  He has had a positive anti-smooth  muscle antibody in the past.  He had resolved on prednisone and  Azathioprine, then he had problems again on Azathioprine.   The  pathologist has raised the question of primary sclerosing cholangitis.  He has no elevation of alkaline phosphatase.  Though it does not rule  that out, it makes it less likely.   PLAN:  1. Recheck his LFT's today.  Recheck his anti-smooth muscle antibody,      a serum protein electrophoresis and ANA.  2. Probably tertiary center referral pending these results.  This is      explained to the patient.  I think I would do as before.  I had      performed an ERCP.  At a tertiary center, he may get an adequate      MRCP to look at the bile ducts, if needed, though I do not think so      at this point.      However, it may be helpful to try to understand.  Perhaps he has      some sort of mixed  bile      duct and hepatic process, though again, with the normal alkaline      phosphatase, I think unlikely.     [name], MD,FACG  Electronically Signed    CEG/MedQ  DD: 09/20/2006  DT: 09/21/2006  Job #: [id number]

## 2010-07-15 NOTE — Progress Notes (Signed)
Quick Note:  lfts improving No changes Will see him June 11 ______

## 2010-07-17 NOTE — Assessment & Plan Note (Signed)
[place]                         GASTROENTEROLOGY OFFICE NOTE   NAME:[name] [name] [name]                  MRN:          [id number]  DATE:05/05/2006                            DOB:          Nov 06, 1965    CHIEF COMPLAINT:  Follow up of autoimmune hepatitis and Crohn's disease.   [name] [name] has been ill recently with some neck pain.  He was told he  had a carotid artery infection, and he tells me then that infection  moved to his Achilles tendon.  He had seen Dr. [name], and then he saw  somebody at the [facility] walk-in clinic who prescribed antibiotics (he  cannot remember the name) for the infection in his left Achilles tendon.  Then he developed a cold.  He had seen Dr. [name] again who told him to  take a Z-Pak if the cold symptoms did not resolve.  They did, but his  neck pain on the right side began to bother him again, so he started the  Z-Pak and is now about 2 days into that and says his neck feels better.  He has not had a fever, but he has had chills.  His Crohn's disease is  quiescent with no significant diarrhea problems.  He remains on  Azathioprine and prednisone for autoimmune hepatitis and for his Crohn's  disease.  He still takes Pentasa, as well.  Otherwise, he seems to be  doing reasonably well without any other new problems.  I checked his  LFT's and CBC as part of his laboratory follow up for his  immunomodulator therapy and his autoimmune hepatitis.  His CBC is  entirely normal with a CT of 6.  Hemoglobin 13.6.  His LFT's show an  alkaline phosphatase of 153, AST 66 and an ALT of 97 with an albumin of  2.7.  He had laboratory studies of AST normal at 36, ALT 67 and alkaline  phosphatase 110 on March 25, 2006, at which time I reduced his  prednisone from 10 to 5 mg a day.  I had increased his Azathioprine to  100 mg daily back in December of 2007.   PHYSICAL EXAMINATION:  VITAL SIGNS:  Weight is 174 pounds, pulse 68,  blood  pressure 112/60.  HEENT:  Eyes anicteric.  ABDOMEN:  Soft, nontender abdomen.  No organomegaly or mass.  NECK:  Slightly tender on the right.  There is no real inflammation or  erythema.  Carotid pulses normal.  There were no bruits.  There is no  mass.   ASSESSMENT:  1. Autoimmune hepatitis with increasing LFT's, question related to his      Azathioprine versus his autoimmune hepatitis versus nonspecific      changes from whatever illness he has been having.  2. Crohn's colitis under good control.  3. I am not sure what these processes he is referring to are.  He      certainly does not seem very ill.  He is bothered by these      symptoms, however.   I doubt they are related to his Azathioprine or any of his other  medications that I  am prescribing, and he is reassured about this at  this time.   PLAN:  1. I will ask for the records from Dr. [name] to try to understand what      is going on here better.  2. Repeat his LFT's and also a BMET and a CBC in 2 weeks.  3. I do not want to make any changes in his autoimmune hepatitis      therapy at this time, but I do realize that the LFT increase could      be related to his Azathioprine perhaps.  It also could be related      to the reduction in his prednisone.  I will call him with those      results.  He knows to come on May 16, 2006.     [name], MD,FACG  Electronically Signed    CEG/MedQ  DD: 05/05/2006  DT: 05/05/2006  Job #: [id number]   cc:   C. Melinda Crutch, M.D.

## 2010-07-17 NOTE — Assessment & Plan Note (Signed)
[place]                         GASTROENTEROLOGY OFFICE NOTE   NAME:Gosselin, IORI GIGANTE                  MRN:          [id number]  DATE:03/27/2007                            DOB:          [birth year]    CHIEF COMPLAINT:  Followup of suspected auto-immune hepatitis.   HISTORY:  Kendal had an MR/MRCP performed because of the persistent  abnormalities in his transaminases at times.  It showed several areas of  dilation and irregularity of intrahepatic bile ducts in the peripheral  right hepatic lobe.  There was no mass.  There was some nonspecific  hepatocellular disease in the right hepatic lobe and minimal right  perihepatic ascites, but no evidence of mass there either.  He has been  feeling okay off and on but recently developed what he calls a virus.  He goes on to describe that he developed pain in multiple body areas  that was migratory and it settled into his Achilles tendons, left  greater than right, and that the Achilles tendon area, at least the skin  overlying that, is red.  It started in his neck and went to his low back  and then to his knees and then made it to the Achilles tendons.  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  He has occasional diarrhea.  He stopped the  CellCept that I had him on.  He thought that his feet went numb and that  resolved.  I had really wanted him to stop that anyway, as that was  started prior to the MR/MRCP.  He is now on prednisone 20 mg daily.   SOCIAL HISTORY:  Notable that he had been paying COBRA health insurance  but it turns out his company had not been paying the premiums, so he was  uninsured for several months, and though he has had the good fortune to  obtain a job at [employer], he is going to have pre-existing  conditions.  He has been told he would have to obtain a lawyer to  rectify this at all.   CURRENT  MEDICATIONS:  1. Listed and reviewed in the chart.  These include vitamin C.  2. Calcium supplement as described.  3. Vitamin D supplement.   ALLERGIES:  CEPHALEXIN causes irritation of his Crohn's disease.   PAST MEDICAL HISTORY:  1. Presumed auto-immune hepatitis, based upon biopsies.  2. Minimal changes on colonoscopy in 2005, but a diagnosis of Crohn's      colitis made in 2001.   PHYSICAL EXAMINATION:  GENERAL:  A well-developed and well-nourished  white man, looking his stated age, in no acute distress.  VITAL SIGNS:  Height 6 feet 1 inch, weight 162 pounds, which is stable.  Pulse 48, blood pressure 80/54.  HEENT:  Eyes anicteric.  He has xanthelasma on the eyes, as previously.  NECK:  Supple.  He is slightly tender in the lateral aspect of the neck.  He has no cervical lymphadenopathy.  C-spine is not tender.  LUNGS:  Clear.  HEART:  S1 and S2.  No murmurs  or gallops.  ABDOMEN:  Soft, nontender, without organomegaly or mass.  EXTREMITIES/MUSCULOSKELETAL:  Lower extremities free of edema.  Left  Achilles tendon is erythematous and slightly tender, i.e. the skin over  that.  It is even slightly warm.  There are faint changes consistent  with this in the right.  There is a full range of motion.  He is  slightly tender there as well though.  His other joints look normal to  me, including the knees, elbows, hands and digits.  SKIN:  Okay except for a very minimal acne on the back.   ASSESSMENT:  1. Auto-immune hepatitis has been suspected.  Question primary source      and cholangitis.  Question some type of overlap syndrome.  2. Question polyarthralgia/polyarthritis or tendinitis problem.  I am      confused by this and told him so.  This whole picture is somewhat      confusing.  3. Crohn's disease has been suspected.   PLAN:  1. Recheck liver function tests.  These have returned, and though he      had markedly abnormal transaminases within the past several months,       these are almost normal now with an ALP of 61, total protein 5.4,      albumin 2.2, otherwise normal transaminases, bilirubin and alkaline      phosphatase.  2. At some point I think he needs some sort of tertiary evaluation,      but he is essentially uninsured right now and is not willing to do      so.  Fortunately he is improved.  A rheumatology evaluation, versus      a primary care evaluation again may be useful.  3. I will see if there is any recourse for him through the insurance      commissioner if I can find out.  I told him to call the insurance      commissioner about his issues with his COBRA payments.  4. Will schedule followup with labs.  Will taper the prednisone at      this point.  Will need to see him within a few months, I think.  5. At some point vitamin D testing, DEXA scanning would be reasonable,      given his chronic prednisone usage, etc.  6. Will also consider other serologic workup with ANA, rheumatoid      factor, etc, though I really prefer him to see a specialist.      Certainly if these arthralgias do not improve, he should be seen.  7. We discussed an endoscopic retrograde cholangiopancreatography but      I will not pursue that right now.  He will probably need some sort      of follow-up biliary imaging, I suspect.     [name], MD,FACG  Electronically Signed    CEG/MedQ  DD: 03/28/2007  DT: 03/29/2007  Job #: [id number]   cc:   C. Melinda Crutch, M.D.

## 2010-07-17 NOTE — Assessment & Plan Note (Signed)
[facility]                         GASTROENTEROLOGY OFFICE NOTE   NAME:[name], PIUS BYROM                  MRN:          [id number]  DATE:05/31/2006                            DOB:          [birth year]    CHIEF COMPLAINT:  Burping, belching.   Mr. Burt Ek returns to the office because he is having what he  describes as intolerable borborygmi belching and gassiness mainly when  he lays down.  Apparently he has had this problem before when he was  under the care of Dr. Velora Heckler.  His joint aches and neck pain seem to be  better.  He still has chills.  He tried Nexium and that seemed to  constipate him somewhat and so he stopped that and his change in bowel  habits resolved.  Recently he has had increasing liver tests, I can not  tell whether it is autoimmune hepatitis flaring or the Azathioprine as a  side effect.  He is holding his Azathioprine to determine that somewhat.  He is currently only on Prednisone 5 mg daily.  He is still on Pentasa  as well.  I did get a chance to review the records from Dr. [name] where  he thought he had carotidynia associated with a viral illness.  His  blood type is included, but there was no other labs sent.  Mr.  Burt Ek has not really lost weight.  He feels well otherwise, but he  is concerned about these symptoms.   PAST MEDICAL HISTORY:  Reviewed and unchanged.   PHYSICAL EXAMINATION:  Shows a weight of 171 pounds, pulse 64, blood  pressure 110/80.  The abdomen is soft and nontender without organomegaly  or mass, there is no percussion splash.  He is alert and oriented x3.   ASSESSMENT:  1. Bloating and belching problems, unclear etiology.  Perhaps he is      having some partial obstructive symptoms, though he has never      really had an inflammation in the small intestine that we are aware      of, he has had inflammatory changes in the colon attributed to      Crohn's disease.  Another possibility would  be just irritable bowel      syndrome.  Bacterial overgrowth is possible as well.  2. Crohn's colitis.  3. Autoimmune hepatitis.  4. Increased liver function tests, question related to Azathioprine      versus the autoimmune hepatitis currently holding Azathioprine with      plans to reassess.   PLAN:  1. Flagyl 500 mg t.i.d. for 10 days as empiric therapy for bacterial      overgrowth, it could help Crohn's flare as well.  2. Continue with lab follow up as planned in about a week and I will      contact him when I see his liver function tests.  3. Should he go on to have these complaints, CT enterography might be      indicated.  4. At some point a repeat colonoscopy could be indicated as well.     [name], MD,FACG  Electronically Signed  CEG/MedQ  DD: 05/31/2006  DT: 06/01/2006  Job #: [id number]   cc:   C. Melinda Crutch, M.D.

## 2010-07-17 NOTE — Assessment & Plan Note (Signed)
[place] OFFICE NOTE   NAME:Besson, KAYMAN SNUFFER                  MRN:          [id number]  DATE:12/23/2005                            DOB:          1965/08/16    CHIEF COMPLAINT:  Followup of autoimmune hepatitis.   Mr. Burt Ek has done well.  He feels fairly well at this point, with no  specific complaints.  His alkaline phosphatase was 131 and ALT 68, AST 37 on  October 12.  A month before the numbers were 123, 79 and 46.  His albumin is  3.3.  Total protein 6.8.  A CBC was normal.  He is on azathioprine 50 mg a  day and prednisone 10 mg a day.  His other problems include Crohn's colitis  (or some form of inflammatory bowel disease), and that seems to be  relatively quiescent.  He is pursuing another job and taking some classes at  the [employer].   MEDICATIONS:  Listed and reviewed in the chart.  He is on calcium 400 mg a  day, with vitamin D 800 units a day.   PHYSICAL EXAMINATION:  VITAL SIGNS:  Weight 180 pounds, pulse 64, blood  pressure 122/74.  ABDOMEN:  Soft, nontender without organomegaly or masses.  No  hepatosplenomegaly.   ASSESSMENT:  1. Autoimmune hepatitis, improving on azathioprine and prednisone.  2. Crohn's colitis/inflammatory bowel disease -- unclear etiology.      Stable.  3. At risk for osteoporosis.   PLAN:  1. Increase calcium to 1200-1500 mg a day, and continue the use of vitamin      D.  2. At some point will check bone densitometry.  3. Continue azathioprine and prednisone at current dose.  4. Recheck CBC and LFTs in one month.  Want to try and get him off the      prednisone;  may need to push the dose of the azathioprine, depending      upon the results of the next labs.  Would anticipate coming off of      prednisone when we normalize his LFTs.  Will call those results to him.     [name], MD,FACG    CEG/MedQ  DD: 12/23/2005  DT:  12/24/2005  Job #: [id number]

## 2010-08-10 ENCOUNTER — Ambulatory Visit (INDEPENDENT_AMBULATORY_CARE_PROVIDER_SITE_OTHER): Payer: Self-pay | Admitting: Internal Medicine

## 2010-08-10 ENCOUNTER — Encounter: Payer: Self-pay | Admitting: Internal Medicine

## 2010-08-10 DIAGNOSIS — K8301 Primary sclerosing cholangitis: Secondary | ICD-10-CM

## 2010-08-10 DIAGNOSIS — K8309 Other cholangitis: Secondary | ICD-10-CM

## 2010-08-10 DIAGNOSIS — Z9119 Patient's noncompliance with other medical treatment and regimen: Secondary | ICD-10-CM

## 2010-08-10 DIAGNOSIS — R21 Rash and other nonspecific skin eruption: Secondary | ICD-10-CM

## 2010-08-10 DIAGNOSIS — IMO0002 Reserved for concepts with insufficient information to code with codable children: Secondary | ICD-10-CM

## 2010-08-10 DIAGNOSIS — K5080 Crohn's disease of both small and large intestine without complications: Secondary | ICD-10-CM

## 2010-08-10 DIAGNOSIS — Z91199 Patient's noncompliance with other medical treatment and regimen due to unspecified reason: Secondary | ICD-10-CM

## 2010-08-10 MED ORDER — PREDNISONE 20 MG PO TABS: ORAL_TABLET | ORAL | Status: DC

## 2010-08-10 NOTE — Patient Instructions (Signed)
To taper the prednisone by taking 5 mg 2 days out of 3 for 4 weeks and then one day out of 3 for 4 weeks. Then you can stop it. He must taper this slowly since you've been on it for so long. Return visit in 3-4 months, call back sooner as needed.

## 2010-08-10 NOTE — Progress Notes (Signed)
  Subjective:    Patient ID: [name], male    DOB: 24-Dec-1965, 45 y.o.   MRN: [id number]  HPI45 yo wm followed for [facility] w/ ? Hepatitis overlap and Crohn's disease. He has been tapering prednisone. Some diarrhea reported. He ? If diarrhea related to water so switching from [employer] to [employer]. No c/o fever, abdominal pain or vomiting.   Went to disability hearing last week - should hear within a few months   Review of Systems Erythematous and itchy rash on neck after working in the yard about 1 week ago.Sas mood ok. Objective:y   Physical ExamThin and NAD Mild erythema left > right neck without associated adenopathy Heart S1 and S2  Abdomen soft and nontender without HSM         Assessment & Plan:

## 2010-08-11 ENCOUNTER — Encounter: Payer: Self-pay | Admitting: Internal Medicine

## 2010-08-11 DIAGNOSIS — Z7952 Long term (current) use of systemic steroids: Secondary | ICD-10-CM | POA: Insufficient documentation

## 2010-08-11 DIAGNOSIS — R21 Rash and other nonspecific skin eruption: Secondary | ICD-10-CM | POA: Insufficient documentation

## 2010-08-11 NOTE — Assessment & Plan Note (Addendum)
LFT's stable and only mildly elevated. Tolerating taper of prednisone. Awaiting decision re: disability and medicare coverage. Need to review vaccine status. He is immune to HAV and HBV.  Follow-up 3 months. Follow-up LFT's It has been difficult to say if he has hepatitis overlap. I favor not but he has had tremendous increases in transaminases in past - amazingly asymptomatic.

## 2010-08-11 NOTE — Assessment & Plan Note (Signed)
Still resistant to endoscopic evaluation and ASA tx for Crohn's as well as colonoscopy. A little difficult to sort out what is due to lack of insurance vs. Odd thought processes about medications, etc.

## 2010-08-11 NOTE — Assessment & Plan Note (Signed)
Erythematous rash on neck. ? Contact dermatitis. He says it is improving so will observe.

## 2010-08-11 NOTE — Assessment & Plan Note (Signed)
Currently tapering prednisone. Remains cushingoid. Lack of insurance makes treatment and further evaluation more difficult.

## 2010-08-11 NOTE — Assessment & Plan Note (Signed)
Has some diarrhea but he attributes this to "bad distilled water" He is unwilling to do a colonoscopy unless he can have more confidence that he can drink a prep. May need to place on observation and use IV metaclopramide and/or Zofran. 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 Await disability and Medicare decision. Monitor for changes - may need ASA therapy though he is reluctant to do this.

## 2010-09-25 ENCOUNTER — Other Ambulatory Visit: Payer: Self-pay | Admitting: Internal Medicine

## 2010-09-25 NOTE — Telephone Encounter (Signed)
Received a automated refill request for Prednisone. In last office visit note patient is to taper off the prednisone. I talked with the patient and he stated that he tried but he couldn't taper off the medication. Is patient to continue to taper or should medication be refilled?

## 2010-12-15 LAB — CBC
HCT: 44.9
Hemoglobin: 15.5
Platelets: 217
WBC: 9

## 2010-12-22 ENCOUNTER — Other Ambulatory Visit: Payer: Self-pay | Admitting: Dermatology

## 2011-01-08 ENCOUNTER — Ambulatory Visit (INDEPENDENT_AMBULATORY_CARE_PROVIDER_SITE_OTHER): Payer: Medicare Other | Admitting: Internal Medicine

## 2011-01-08 ENCOUNTER — Other Ambulatory Visit: Payer: Medicare Other

## 2011-01-08 ENCOUNTER — Other Ambulatory Visit (INDEPENDENT_AMBULATORY_CARE_PROVIDER_SITE_OTHER): Payer: Medicare Other

## 2011-01-08 ENCOUNTER — Encounter: Payer: Self-pay | Admitting: Internal Medicine

## 2011-01-08 DIAGNOSIS — F329 Major depressive disorder, single episode, unspecified: Secondary | ICD-10-CM

## 2011-01-08 DIAGNOSIS — K5080 Crohn's disease of both small and large intestine without complications: Secondary | ICD-10-CM

## 2011-01-08 DIAGNOSIS — F3289 Other specified depressive episodes: Secondary | ICD-10-CM

## 2011-01-08 DIAGNOSIS — K8309 Other cholangitis: Secondary | ICD-10-CM

## 2011-01-08 DIAGNOSIS — IMO0002 Reserved for concepts with insufficient information to code with codable children: Secondary | ICD-10-CM

## 2011-01-08 DIAGNOSIS — Z800 Family history of malignant neoplasm of digestive organs: Secondary | ICD-10-CM

## 2011-01-08 DIAGNOSIS — K8301 Primary sclerosing cholangitis: Secondary | ICD-10-CM

## 2011-01-08 LAB — CBC WITH DIFFERENTIAL/PLATELET
Basophils Absolute: 0 10*3/uL (ref 0.0–0.1)
Eosinophils Absolute: 0.2 10*3/uL (ref 0.0–0.7)
HCT: 42.7 % (ref 39.0–52.0)
Hemoglobin: 14.7 g/dL (ref 13.0–17.0)
Lymphs Abs: 1.4 10*3/uL (ref 0.7–4.0)
MCHC: 34.4 g/dL (ref 30.0–36.0)
Monocytes Absolute: 0.8 10*3/uL (ref 0.1–1.0)
Neutro Abs: 5.3 10*3/uL (ref 1.4–7.7)
Platelets: 170 10*3/uL (ref 150.0–400.0)
RDW: 13.5 % (ref 11.5–14.6)

## 2011-01-08 LAB — COMPREHENSIVE METABOLIC PANEL
ALT: 188 U/L — ABNORMAL HIGH (ref 0–53)
AST: 119 U/L — ABNORMAL HIGH (ref 0–37)
Alkaline Phosphatase: 202 U/L — ABNORMAL HIGH (ref 39–117)
Creatinine, Ser: 0.9 mg/dL (ref 0.4–1.5)
GFR: 92.3 mL/min (ref >60.00)
Sodium: 137 mEq/L (ref 135–145)
Total Bilirubin: 0.7 mg/dL (ref 0.3–1.2)

## 2011-01-08 MED ORDER — PEG-KCL-NACL-NASULF-NA ASC-C 100 G PO SOLR: 1.0000 | Freq: Once | ORAL | Status: DC

## 2011-01-08 NOTE — Assessment & Plan Note (Signed)
Down to 5 mg daily Bone density assessment will be needed

## 2011-01-08 NOTE — Assessment & Plan Note (Signed)
E/c for eval and due to cirrhosis

## 2011-01-08 NOTE — Assessment & Plan Note (Addendum)
Reassess with labs and MRCP/MRI. Tried to get CA 19-9 but could not find medical necessity. Need to consider tertiary referral again.

## 2011-01-08 NOTE — Progress Notes (Signed)
  Subjective:    Patient ID: [name], male    DOB: 03/23/1965, 45 y.o.   MRN: [id number]  HPI He returns for follow-up of Josephine and Crohn's disease. He now has disability and Medicare. Has been seeing some other physicians. ENT - Flonase helped sinus problems Neurologist - [place] - nortriptyline for vertigo and migraines and ? Serotonin syndrome given Prozac (self dx) so stopped both - does not feel that depressed and feels better overall. Did get sort of manic for 2 weeks.  Derm: having pre-cancerous skin lesions treated/removed. GI: Had 3 solid bowel movements a day for about a week ut now with infrequent defecation. Seeing a fair amount of mucous with defecation.  Now using reverse osmosis water from [employer] - only water he can tolerate.  Outpatient Encounter Prescriptions as of 01/08/2011  Medication Sig Dispense Refill  . ALPRAZolam (XANAX) 0.5 MG tablet Take 0.5 mg by mouth at bedtime as needed.        . calcium carbonate (OS-CAL) 600 MG TABS Take 1,200 mg by mouth daily.        . cholecalciferol (VITAMIN D) 1000 UNITS tablet Take 1,000 Units by mouth daily.        . predniSONE (DELTASONE) 5 MG tablet Take 5 mg by mouth daily.        . vitamin C (ASCORBIC ACID) 500 MG tablet Take 500 mg by mouth daily.        . peg 3350 powder (MOVIPREP) 100 G SOLR Take 1 kit (100 g total) by mouth once.  1 kit  0  . DISCONTD: FLUoxetine (PROZAC) 20 MG capsule Take 20 mg by mouth daily.        Marland Kitchen DISCONTD: predniSONE (DELTASONE) 20 MG tablet TAKE 1 TABLET BY MOUTH EVERY DAY  30 tablet  0   Allergies  Allergen Reactions  . Additions Food Enhancer     Agent: Additives and preservatives  . Azathioprine   . Cellcept    Past Medical History  Diagnosis Date  . Crohn's disease of small and large intestines   . Hypercholesterolemia   . History of alcohol abuse   . History of substance abuse   . Depression   . 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 Primary sclerosing cholangitis       ? hepatitis overlap - liver bx x 2 and MRCP   Past Surgical History  Procedure Date  . Cataract extraction, bilateral   . Pylonidal cyst removal   . Foot surgery     right  . Percutaneous liver biopsy 2007 and 2008  . Colonoscopy 05/02/2003    Crohn's colitis        Review of Systems As above    Objective:   Physical Exam General: thin NAD Eyes: anicteric Lungs: clear Heart: S1S2 no rubs, murmurs or gallops Abdomen: soft and nontender, BS+ Ext: no edema Psych: more animated, somewhat talkative          Assessment & Plan:

## 2011-01-08 NOTE — Patient Instructions (Addendum)
Please go to the basement upon leaving today to have your labs done. You have been scheduled for an Endoscopy/Colonoscopy with separate instructions given. You have been scheduled for a MRI of the Abdomin at [facility] on 01/11/11 at 8:00 pm please arrive at 7:30 pm. Please have nothing to eat or drink 4 hours prior to the procedure.

## 2011-01-11 ENCOUNTER — Ambulatory Visit (HOSPITAL_COMMUNITY)
Admission: RE | Admit: 2011-01-11 | Discharge: 2011-01-11 | Disposition: A | Discharge status: Home / Self Care | Payer: Medicare Other | Source: Ambulatory Visit | Attending: Internal Medicine | Admitting: Internal Medicine

## 2011-01-11 DIAGNOSIS — K8309 Other cholangitis: Secondary | ICD-10-CM | POA: Insufficient documentation

## 2011-01-11 DIAGNOSIS — K8301 Primary sclerosing cholangitis: Secondary | ICD-10-CM

## 2011-01-11 MED ORDER — GADOBENATE DIMEGLUMINE 529 MG/ML IV SOLN
16.0000 mL | Freq: Once | INTRAVENOUS | Status: AC | PRN
  Administered 2011-01-11: 16 mL via INTRAVENOUS

## 2011-01-12 ENCOUNTER — Other Ambulatory Visit: Payer: Self-pay | Admitting: Internal Medicine

## 2011-01-12 DIAGNOSIS — K8301 Primary sclerosing cholangitis: Secondary | ICD-10-CM

## 2011-01-13 NOTE — Progress Notes (Signed)
Quick Note:  Let him know labs and MR without surprises - consistent with PSC still No sign of tumor Will discuss more at colonoscopy ______

## 2011-01-22 ENCOUNTER — Other Ambulatory Visit: Payer: Self-pay | Admitting: Internal Medicine

## 2011-01-25 NOTE — Telephone Encounter (Signed)
Prednisone was to be discontinued. Do you want to refill this? Please advise.

## 2011-01-25 NOTE — Telephone Encounter (Signed)
He should have 5 mg tabs and not need this Rx  Please clarify with patient

## 2011-01-26 ENCOUNTER — Other Ambulatory Visit: Payer: Self-pay | Admitting: Internal Medicine

## 2011-01-26 NOTE — Telephone Encounter (Signed)
Noted  

## 2011-01-26 NOTE — Telephone Encounter (Signed)
Ok - may refill 20 mg tabs and take 1/4

## 2011-01-26 NOTE — Telephone Encounter (Signed)
Patient states that he is taking 5 mg of prednisone daily he takes to 20 mg tablets and break them in 4 parts. Do you want to refill this or what dosage if any should the patient have? Please advise.

## 2011-01-28 ENCOUNTER — Ambulatory Visit (AMBULATORY_SURGERY_CENTER): Payer: Medicare Other | Admitting: Internal Medicine

## 2011-01-28 ENCOUNTER — Encounter: Payer: Self-pay | Admitting: Internal Medicine

## 2011-01-28 VITALS — BP 142/85 | HR 72 | Temp 97.6°F | Resp 20 | Ht 74.0 in | Wt 175.0 lb

## 2011-01-28 DIAGNOSIS — K8309 Other cholangitis: Secondary | ICD-10-CM

## 2011-01-28 DIAGNOSIS — D129 Benign neoplasm of anus and anal canal: Secondary | ICD-10-CM

## 2011-01-28 DIAGNOSIS — K7460 Unspecified cirrhosis of liver: Secondary | ICD-10-CM

## 2011-01-28 DIAGNOSIS — K5080 Crohn's disease of both small and large intestine without complications: Secondary | ICD-10-CM

## 2011-01-28 DIAGNOSIS — D128 Benign neoplasm of rectum: Secondary | ICD-10-CM

## 2011-01-28 DIAGNOSIS — Z800 Family history of malignant neoplasm of digestive organs: Secondary | ICD-10-CM

## 2011-01-28 DIAGNOSIS — K5289 Other specified noninfective gastroenteritis and colitis: Secondary | ICD-10-CM

## 2011-01-28 HISTORY — PX: ESOPHAGOGASTRODUODENOSCOPY: SHX1529

## 2011-01-28 MED ORDER — SODIUM CHLORIDE 0.9 % IV SOLN: 500.0000 mL | INTRAVENOUS | Status: DC

## 2011-01-28 MED ORDER — PREDNISONE 20 MG PO TABS: ORAL_TABLET | ORAL | Status: DC

## 2011-01-28 NOTE — Progress Notes (Signed)
Patient did not have preoperative order for IV antibiotic SSI prophylaxis. (G8918)  Patient did not experience any of the following events: a burn prior to discharge; a fall within the facility; wrong site/side/patient/procedure/implant event; or a hospital transfer or hospital admission upon discharge from the facility. (G8907)  

## 2011-01-28 NOTE — Patient Instructions (Addendum)
The upper endoscopy (esophagus, stomach and duodenum) exam was normal. The colonoscopy showed active Crohn's disease in the right side of the colon and a rectal polyp (removed). Internal hemorrhoids were also seen. Once the pathology results are in  I will call you about the results and plans.  Please get a flu shot soon if you have not already this season.  [name], MD, [facility]   FOLLOW DISCHARGE INSTRUCTIONS ([place]).   Information on polyps & crohns disease given to you  [name] recommends you get a pneumonia vaccine if you haven't done so.

## 2011-01-29 ENCOUNTER — Telehealth: Payer: Self-pay

## 2011-01-29 NOTE — Telephone Encounter (Signed)
Left message

## 2011-02-03 ENCOUNTER — Other Ambulatory Visit: Payer: Self-pay

## 2011-02-03 ENCOUNTER — Encounter: Payer: Self-pay | Admitting: Internal Medicine

## 2011-02-03 DIAGNOSIS — K5090 Crohn's disease, unspecified, without complications: Secondary | ICD-10-CM

## 2011-02-03 DIAGNOSIS — E785 Hyperlipidemia, unspecified: Secondary | ICD-10-CM

## 2011-02-03 DIAGNOSIS — IMO0002 Reserved for concepts with insufficient information to code with codable children: Secondary | ICD-10-CM

## 2011-02-03 MED ORDER — MESALAMINE 400 MG PO TBEC: DELAYED_RELEASE_TABLET | ORAL | Status: DC

## 2011-02-03 NOTE — Progress Notes (Signed)
Addended by: [name] on: 02/03/2011 09:07 AM   Modules accepted: Orders

## 2011-02-03 NOTE — Progress Notes (Signed)
Quick Note:  Office  Let him know the polyp was benign but precancerous (serrated adenoma) and will plan on a routine repeat colonoscopy in 3 years (12/2013) The biopsies confirm Crohn's active in the colon, I have sent a prescription for Asacol to his pharmacy and I want him to start that for treatment I want him to have a fasting lipid panel, may use a diagnosis of hyperlipidemia, and also a bone density study and a vitamin D level because of chronic steroid use also known as long-term use of steroids he also has a history of vitamin D deficiency so we can use that diagnosis as well  REV me in 6- 8 weeks  [facility]  Colonoscopy recall 3 years, no letter ______

## 2011-02-05 ENCOUNTER — Other Ambulatory Visit (INDEPENDENT_AMBULATORY_CARE_PROVIDER_SITE_OTHER): Payer: Medicare Other

## 2011-02-05 ENCOUNTER — Other Ambulatory Visit: Payer: Self-pay | Admitting: Internal Medicine

## 2011-02-05 ENCOUNTER — Ambulatory Visit (INDEPENDENT_AMBULATORY_CARE_PROVIDER_SITE_OTHER)
Admission: RE | Admit: 2011-02-05 | Discharge: 2011-02-05 | Disposition: A | Discharge status: Home / Self Care | Payer: Medicare Other | Source: Ambulatory Visit | Attending: Internal Medicine | Admitting: Internal Medicine

## 2011-02-05 DIAGNOSIS — E785 Hyperlipidemia, unspecified: Secondary | ICD-10-CM

## 2011-02-05 DIAGNOSIS — IMO0002 Reserved for concepts with insufficient information to code with codable children: Secondary | ICD-10-CM

## 2011-02-05 DIAGNOSIS — K5090 Crohn's disease, unspecified, without complications: Secondary | ICD-10-CM

## 2011-02-05 DIAGNOSIS — Z13820 Encounter for screening for osteoporosis: Secondary | ICD-10-CM

## 2011-02-05 LAB — LIPID PANEL
HDL: 62.5 mg/dL (ref >39.00)
Total CHOL/HDL Ratio: 5
VLDL: 15.2 mg/dL (ref 0.0–40.0)

## 2011-02-07 NOTE — Progress Notes (Signed)
Quick Note:  Let him know vit D is ok  He has high cholesterol including LDL - probably in part from his liver disease  It is ok for him to take medication for this if primary care MD recommends - he should see his PCP in next 1-2 months  Please forward the lab results to his PCP, including these comments ______

## 2011-02-08 NOTE — Progress Notes (Signed)
Quick Note:  Please schedule a next available office visit to see me - January is ok ______

## 2011-02-09 LAB — VITAMIN D 1,25 DIHYDROXY: Vitamin D3 1, 25 (OH)2: 51 pg/mL

## 2011-02-09 NOTE — Telephone Encounter (Signed)
Refilled 01/28/11

## 2011-02-16 ENCOUNTER — Encounter: Payer: Self-pay | Admitting: Internal Medicine

## 2011-02-17 ENCOUNTER — Encounter: Payer: Self-pay | Admitting: Internal Medicine

## 2011-02-17 DIAGNOSIS — M8580 Other specified disorders of bone density and structure, unspecified site: Secondary | ICD-10-CM | POA: Insufficient documentation

## 2011-02-17 NOTE — Progress Notes (Signed)
Quick Note:  Let him know he has osteopenia. I will discuss treatment of this when he returns. At this point would recommend vitamin D and calcium supplementation but given his intolerances unless he knows he can take that we'll wait till we discussed it at his return visit. ______

## 2011-03-05 DIAGNOSIS — E7800 Pure hypercholesterolemia, unspecified: Secondary | ICD-10-CM | POA: Diagnosis not present

## 2011-03-05 DIAGNOSIS — R030 Elevated blood-pressure reading, without diagnosis of hypertension: Secondary | ICD-10-CM | POA: Diagnosis not present

## 2011-03-05 DIAGNOSIS — M25569 Pain in unspecified knee: Secondary | ICD-10-CM | POA: Diagnosis not present

## 2011-03-09 ENCOUNTER — Encounter: Payer: Self-pay | Admitting: Internal Medicine

## 2011-03-09 ENCOUNTER — Ambulatory Visit (INDEPENDENT_AMBULATORY_CARE_PROVIDER_SITE_OTHER): Payer: Medicare Other | Admitting: Internal Medicine

## 2011-03-09 DIAGNOSIS — E559 Vitamin D deficiency, unspecified: Secondary | ICD-10-CM

## 2011-03-09 DIAGNOSIS — E785 Hyperlipidemia, unspecified: Secondary | ICD-10-CM

## 2011-03-09 DIAGNOSIS — IMO0002 Reserved for concepts with insufficient information to code with codable children: Secondary | ICD-10-CM | POA: Diagnosis not present

## 2011-03-09 DIAGNOSIS — M8580 Other specified disorders of bone density and structure, unspecified site: Secondary | ICD-10-CM

## 2011-03-09 DIAGNOSIS — K8309 Other cholangitis: Secondary | ICD-10-CM

## 2011-03-09 DIAGNOSIS — K5080 Crohn's disease of both small and large intestine without complications: Secondary | ICD-10-CM

## 2011-03-09 DIAGNOSIS — R978 Other abnormal tumor markers: Secondary | ICD-10-CM

## 2011-03-09 DIAGNOSIS — M899 Disorder of bone, unspecified: Secondary | ICD-10-CM

## 2011-03-09 DIAGNOSIS — K8301 Primary sclerosing cholangitis: Secondary | ICD-10-CM

## 2011-03-09 NOTE — Patient Instructions (Addendum)
Return to see [name] in 6 months. Do not start the Pravastatin. We will check on the cost and accepted diagnosis for the CA 19-9 test [name] request you to have and we will contact you concerning this.

## 2011-03-09 NOTE — Assessment & Plan Note (Signed)
He is aware of risks

## 2011-03-09 NOTE — Assessment & Plan Note (Signed)
He is on vitamin DE and calcium supplementation. I encouraged him to increase his calcium supplementation. I do not think it is clear that bisphosphonates makes sense here, especially with his multiple drug intolerances.

## 2011-03-09 NOTE — Assessment & Plan Note (Addendum)
He discussed tertiary referral. I don't think anything different would be done at this time. Things are limited by his drug intolerances and phobias if you will. His social situation might not support liver transplantation even if he were candidate for that. We'll revisit the topic set followup in 6 months. He is aware of the cancer risk and potential bad outcome  including death.

## 2011-03-09 NOTE — Assessment & Plan Note (Addendum)
Lab Results  Component Value Date   CHOL 288* 02/05/2011   HDL 62.50 02/05/2011   LDLDIRECT 200.9 02/05/2011   TRIG 76.0 02/05/2011   CHOLHDL 5 02/05/2011   He has had higher levels in the past. I have decided to hold off on pravastatin as he is not keen on taking this due to concerns over intolerances. I will try to look into his Framingham risk as well. I don't think his liver disease precludes taking a statin.  Framingham 10 year MI risk is 5%

## 2011-03-09 NOTE — Progress Notes (Signed)
Subjective:    Patient ID: [name], male    DOB: 12/04/1965, 46 y.o.   MRN: [id number]  HPI The patient returns for followup of his Crohn's colitis, primary sclerosing cholangitis, abnormal lipids, and to review other testing.  He reports that he feels pretty well, moving his bowels every other day or so without diarrhea. He has some abdominal pain at times but not severe. He remains on 5 mg prednisone daily, having to take a quarter of a 20 mg tablet as he indicates that is the only size of prednisone tablets he will tolerate. He continues on the reverse osmosis [employer] water.  He had osteopenia on his bone densitometry scan. He is taking a calcium supplement and vitamin D. His vitamin D level was fine. His MRCP showed persistent PSC changes in the right lobe, with some progressive changes of inflammation but there was nothing concerning about tumor or cholangiocarcinoma. He remains aware that that is a risk. Medicare did not support obtaining a CA 19-9 so it was not done.  He had an elevated cholesterol but his triglyceride, the LDL and HDL were okay. Dr. [name] has suggested starting pravastatin but is deferred to me.  He's having right knee problems, I guess they been ongoing, and he is due to see Dr. Percell Miller of orthopedics I think this week or next.  Allergies  Allergen Reactions  . Additions Food Enhancer     Agent: Additives and preservatives  . Asacol (Mesalamine)   . Azathioprine   . Cellcept    Outpatient Prescriptions Prior to Visit  Medication Sig Dispense Refill  . ALPRAZolam (XANAX) 0.5 MG tablet Take 0.5 mg by mouth at bedtime as needed.        . cholecalciferol (VITAMIN D) 1000 UNITS tablet Take 1,000 Units by mouth daily.        . predniSONE (DELTASONE) 20 MG tablet 1/4 tablet 5 mg daily  30 tablet  0  . vitamin C (ASCORBIC ACID) 500 MG tablet Take 500 mg by mouth daily.        . calcium carbonate (OS-CAL) 600 MG TABS Take 1,200 mg by mouth daily.        .  mesalamine (ASACOL) 400 MG EC tablet 4 tablets 3 times a day  360 tablet  5   Facility-Administered Medications Prior to Visit  Medication Dose Route Frequency Provider Last Rate Last Dose  . 0.9 %  sodium chloride infusion  500 mL Intravenous Continuous [name], MD       Past Medical History  Diagnosis Date  . Crohn's disease of small and large intestines   . Hypercholesterolemia   . History of alcohol abuse   . History of substance abuse   . Depression   . 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 Primary sclerosing cholangitis     ? hepatitis overlap - liver bx x 2 and MRCP  . Allergy     SEASONAL  . Anxiety   . Cataract     BILATERAL/REMOVED  . Hypertension    Past Surgical History  Procedure Date  . Cataract extraction, bilateral   . Pylonidal cyst removal   . Foot surgery     right  . Percutaneous liver biopsy 2007 and 2008  . Colonoscopy 2001, 05/02/2003, 01/28/11    2012: Right colon Crohn's, rectal polyp  . Esophagogastroduodenoscopy 01/28/11    Normal   History   Social History  . Marital Status: Single  Spouse Name: N/A    Number of Children: 1  . Years of Education: N/A   Occupational History  . Disability     Social History Main Topics  . Smoking status: Former Smoker    Types: Cigarettes  . Smokeless tobacco: Never Used  . Alcohol Use: No  . Drug Use: No  . Sexually Active: Not on file   Other Topics Concern  . Not on file   Social History Narrative  . No narrative on file   Family History  Problem Relation Age of Onset  . Heart disease Father   . Thyroid cancer Father   . Breast cancer Mother   . Stomach cancer Mother   . Colon cancer Neg Hx         Review of Systems As above. 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 His mood seems okay, no complaints air, off antidepressants.    Objective:   Physical Exam General:  NAD Eyes:   anicteric Lungs:  clear Heart:  S1S2 no  rubs, murmurs or gallops Abdomen:  soft and nontender, BS+ Ext:   no edema    Data Reviewed: Bone density scan, labs, MRI MRCP to         Assessment & Plan:

## 2011-03-09 NOTE — Assessment & Plan Note (Addendum)
He appears fairly asymptomatic at this point. His multiple drug intolerances in 3 years her phobias about the possibility of this occurring limit his treatment options. He will remain on low dose prednisone. Risks of bone loss, cataracts, hypertension and other problems with prednisone have been reviewed in the past. Also advised him of the risk of osteonecrosis of the hips. He'll return in about 6 months for routine followup sooner if needed.  He took 1 Asacol and said he could not get warm at all and had a terrible cold feeling and refuses to take it again.  One thing to consider, but I have not mention to him before would be the possibility of seeing a therapist her counselor about his multiple issues and concerns.

## 2011-03-09 NOTE — Assessment & Plan Note (Addendum)
Levels ok continue 1000 mg daily.

## 2011-03-12 DIAGNOSIS — M25569 Pain in unspecified knee: Secondary | ICD-10-CM | POA: Diagnosis not present

## 2011-03-18 DIAGNOSIS — M1710 Unilateral primary osteoarthritis, unspecified knee: Secondary | ICD-10-CM | POA: Diagnosis not present

## 2011-03-22 DIAGNOSIS — M1710 Unilateral primary osteoarthritis, unspecified knee: Secondary | ICD-10-CM | POA: Diagnosis not present

## 2011-03-24 ENCOUNTER — Ambulatory Visit: Payer: Medicare Other | Attending: Orthopedic Surgery

## 2011-03-24 DIAGNOSIS — M25569 Pain in unspecified knee: Secondary | ICD-10-CM | POA: Insufficient documentation

## 2011-03-24 DIAGNOSIS — F3289 Other specified depressive episodes: Secondary | ICD-10-CM | POA: Insufficient documentation

## 2011-03-24 DIAGNOSIS — R5381 Other malaise: Secondary | ICD-10-CM | POA: Insufficient documentation

## 2011-03-24 DIAGNOSIS — F329 Major depressive disorder, single episode, unspecified: Secondary | ICD-10-CM | POA: Insufficient documentation

## 2011-03-24 DIAGNOSIS — K5090 Crohn's disease, unspecified, without complications: Secondary | ICD-10-CM | POA: Diagnosis not present

## 2011-03-24 DIAGNOSIS — M25659 Stiffness of unspecified hip, not elsewhere classified: Secondary | ICD-10-CM | POA: Insufficient documentation

## 2011-03-24 DIAGNOSIS — M25559 Pain in unspecified hip: Secondary | ICD-10-CM | POA: Insufficient documentation

## 2011-03-24 DIAGNOSIS — IMO0001 Reserved for inherently not codable concepts without codable children: Secondary | ICD-10-CM | POA: Diagnosis not present

## 2011-03-24 DIAGNOSIS — I10 Essential (primary) hypertension: Secondary | ICD-10-CM | POA: Insufficient documentation

## 2011-03-24 DIAGNOSIS — K7689 Other specified diseases of liver: Secondary | ICD-10-CM | POA: Diagnosis not present

## 2011-03-29 ENCOUNTER — Ambulatory Visit: Payer: Medicare Other

## 2011-03-29 DIAGNOSIS — M25569 Pain in unspecified knee: Secondary | ICD-10-CM | POA: Diagnosis not present

## 2011-03-29 DIAGNOSIS — IMO0001 Reserved for inherently not codable concepts without codable children: Secondary | ICD-10-CM | POA: Diagnosis not present

## 2011-03-29 DIAGNOSIS — I10 Essential (primary) hypertension: Secondary | ICD-10-CM | POA: Diagnosis not present

## 2011-03-29 DIAGNOSIS — R5381 Other malaise: Secondary | ICD-10-CM | POA: Diagnosis not present

## 2011-03-29 DIAGNOSIS — M25559 Pain in unspecified hip: Secondary | ICD-10-CM | POA: Diagnosis not present

## 2011-03-29 DIAGNOSIS — M25659 Stiffness of unspecified hip, not elsewhere classified: Secondary | ICD-10-CM | POA: Diagnosis not present

## 2011-03-30 ENCOUNTER — Ambulatory Visit: Payer: Medicare Other | Admitting: Physical Therapy

## 2011-03-31 ENCOUNTER — Ambulatory Visit: Payer: Medicare Other

## 2011-03-31 DIAGNOSIS — M25569 Pain in unspecified knee: Secondary | ICD-10-CM | POA: Diagnosis not present

## 2011-03-31 DIAGNOSIS — M25659 Stiffness of unspecified hip, not elsewhere classified: Secondary | ICD-10-CM | POA: Diagnosis not present

## 2011-03-31 DIAGNOSIS — M25559 Pain in unspecified hip: Secondary | ICD-10-CM | POA: Diagnosis not present

## 2011-03-31 DIAGNOSIS — IMO0001 Reserved for inherently not codable concepts without codable children: Secondary | ICD-10-CM | POA: Diagnosis not present

## 2011-03-31 DIAGNOSIS — I10 Essential (primary) hypertension: Secondary | ICD-10-CM | POA: Diagnosis not present

## 2011-03-31 DIAGNOSIS — R5381 Other malaise: Secondary | ICD-10-CM | POA: Diagnosis not present

## 2011-04-05 ENCOUNTER — Ambulatory Visit: Payer: Medicare Other | Attending: Orthopedic Surgery

## 2011-04-05 DIAGNOSIS — M25569 Pain in unspecified knee: Secondary | ICD-10-CM | POA: Insufficient documentation

## 2011-04-05 DIAGNOSIS — K5090 Crohn's disease, unspecified, without complications: Secondary | ICD-10-CM | POA: Diagnosis not present

## 2011-04-05 DIAGNOSIS — F3289 Other specified depressive episodes: Secondary | ICD-10-CM | POA: Diagnosis not present

## 2011-04-05 DIAGNOSIS — R5381 Other malaise: Secondary | ICD-10-CM | POA: Insufficient documentation

## 2011-04-05 DIAGNOSIS — M25659 Stiffness of unspecified hip, not elsewhere classified: Secondary | ICD-10-CM | POA: Diagnosis not present

## 2011-04-05 DIAGNOSIS — K7689 Other specified diseases of liver: Secondary | ICD-10-CM | POA: Diagnosis not present

## 2011-04-05 DIAGNOSIS — IMO0001 Reserved for inherently not codable concepts without codable children: Secondary | ICD-10-CM | POA: Insufficient documentation

## 2011-04-05 DIAGNOSIS — M25559 Pain in unspecified hip: Secondary | ICD-10-CM | POA: Diagnosis not present

## 2011-04-05 DIAGNOSIS — F329 Major depressive disorder, single episode, unspecified: Secondary | ICD-10-CM | POA: Insufficient documentation

## 2011-04-05 DIAGNOSIS — I10 Essential (primary) hypertension: Secondary | ICD-10-CM | POA: Insufficient documentation

## 2011-04-07 ENCOUNTER — Ambulatory Visit: Payer: Medicare Other

## 2011-04-12 ENCOUNTER — Encounter: Payer: Medicare Other | Admitting: Physical Therapy

## 2011-04-14 ENCOUNTER — Encounter: Payer: Medicare Other | Admitting: Physical Therapy

## 2011-04-27 DIAGNOSIS — M1710 Unilateral primary osteoarthritis, unspecified knee: Secondary | ICD-10-CM | POA: Diagnosis not present

## 2011-05-14 DIAGNOSIS — G479 Sleep disorder, unspecified: Secondary | ICD-10-CM | POA: Diagnosis not present

## 2011-05-24 ENCOUNTER — Other Ambulatory Visit: Payer: Self-pay | Admitting: Internal Medicine

## 2011-07-13 DIAGNOSIS — H5310 Unspecified subjective visual disturbances: Secondary | ICD-10-CM | POA: Diagnosis not present

## 2011-07-13 DIAGNOSIS — Z961 Presence of intraocular lens: Secondary | ICD-10-CM | POA: Diagnosis not present

## 2011-07-19 ENCOUNTER — Other Ambulatory Visit: Payer: Self-pay | Admitting: Dermatology

## 2011-07-19 DIAGNOSIS — C44711 Basal cell carcinoma of skin of unspecified lower limb, including hip: Secondary | ICD-10-CM | POA: Diagnosis not present

## 2011-07-19 DIAGNOSIS — D239 Other benign neoplasm of skin, unspecified: Secondary | ICD-10-CM | POA: Diagnosis not present

## 2011-07-19 DIAGNOSIS — L570 Actinic keratosis: Secondary | ICD-10-CM | POA: Diagnosis not present

## 2011-07-19 DIAGNOSIS — D485 Neoplasm of uncertain behavior of skin: Secondary | ICD-10-CM | POA: Diagnosis not present

## 2011-07-19 DIAGNOSIS — Z85828 Personal history of other malignant neoplasm of skin: Secondary | ICD-10-CM | POA: Diagnosis not present

## 2011-09-15 ENCOUNTER — Other Ambulatory Visit: Payer: Self-pay | Admitting: Internal Medicine

## 2011-11-03 DIAGNOSIS — R350 Frequency of micturition: Secondary | ICD-10-CM | POA: Diagnosis not present

## 2011-12-28 DIAGNOSIS — J040 Acute laryngitis: Secondary | ICD-10-CM | POA: Diagnosis not present

## 2012-01-12 ENCOUNTER — Other Ambulatory Visit: Payer: Self-pay | Admitting: Internal Medicine

## 2012-01-12 NOTE — Telephone Encounter (Signed)
Sheri should we refill this or ask [name], thx

## 2012-01-17 ENCOUNTER — Telehealth: Payer: Self-pay | Admitting: Internal Medicine

## 2012-01-17 MED ORDER — PREDNISONE 20 MG PO TABS: ORAL_TABLET | ORAL | Status: DC

## 2012-01-17 NOTE — Telephone Encounter (Signed)
Patient has been on 5 mg of prednisone.  He was due for follow up in July.  I have scheduled an office visit for tomorrow.  His current rx for prednisone are for 20 mg and he has been quartering these.  Do you want me to change the rx to a lower dose pill or send in refill as is?

## 2012-01-17 NOTE — Telephone Encounter (Signed)
Better refill as is - he is partial to toe 20 mg tabs - take a 1/4

## 2012-01-18 ENCOUNTER — Other Ambulatory Visit (INDEPENDENT_AMBULATORY_CARE_PROVIDER_SITE_OTHER): Payer: Medicare Other

## 2012-01-18 ENCOUNTER — Ambulatory Visit (INDEPENDENT_AMBULATORY_CARE_PROVIDER_SITE_OTHER): Payer: Medicare Other | Admitting: Internal Medicine

## 2012-01-18 ENCOUNTER — Encounter: Payer: Self-pay | Admitting: Internal Medicine

## 2012-01-18 VITALS — BP 120/90 | HR 112 | Ht 73.25 in | Wt 170.4 lb

## 2012-01-18 DIAGNOSIS — K5080 Crohn's disease of both small and large intestine without complications: Secondary | ICD-10-CM

## 2012-01-18 DIAGNOSIS — Z23 Encounter for immunization: Secondary | ICD-10-CM

## 2012-01-18 DIAGNOSIS — Z860100 Personal history of colon polyps, unspecified: Secondary | ICD-10-CM | POA: Insufficient documentation

## 2012-01-18 DIAGNOSIS — K8309 Other cholangitis: Secondary | ICD-10-CM

## 2012-01-18 DIAGNOSIS — E559 Vitamin D deficiency, unspecified: Secondary | ICD-10-CM

## 2012-01-18 DIAGNOSIS — K8301 Primary sclerosing cholangitis: Secondary | ICD-10-CM

## 2012-01-18 LAB — COMPREHENSIVE METABOLIC PANEL
ALT: 328 U/L — ABNORMAL HIGH (ref 0–53)
Albumin: 3 g/dL — ABNORMAL LOW (ref 3.5–5.2)
BUN: 13 mg/dL (ref 6–23)
CO2: 29 mEq/L (ref 19–32)
Calcium: 8.8 mg/dL (ref 8.4–10.5)
Chloride: 100 mEq/L (ref 96–112)
Creatinine, Ser: 1 mg/dL (ref 0.4–1.5)
GFR: 81.68 mL/min (ref >60.00)
Potassium: 3.4 mEq/L — ABNORMAL LOW (ref 3.5–5.1)

## 2012-01-18 LAB — CBC WITH DIFFERENTIAL/PLATELET
Basophils Relative: 0.6 % (ref 0.0–3.0)
Hemoglobin: 15.5 g/dL (ref 13.0–17.0)
Lymphocytes Relative: 24.8 % (ref 12.0–46.0)
Monocytes Relative: 18.4 % — ABNORMAL HIGH (ref 3.0–12.0)
Neutro Abs: 3.8 10*3/uL (ref 1.4–7.7)
Neutrophils Relative %: 53.7 % (ref 43.0–77.0)
RBC: 4.81 Mil/uL (ref 4.22–5.81)
WBC: 7 10*3/uL (ref 4.5–10.5)

## 2012-01-18 LAB — PROTIME-INR: INR: 1.1 ratio — ABNORMAL HIGH (ref 0.8–1.0)

## 2012-01-18 NOTE — Progress Notes (Signed)
Subjective:    Patient ID: [name], male    DOB: 07-07-1965, 46 y.o.   MRN: [id number]  HPI The patient returns for followup, last seen in January. He is followed her for 2 main problems, Crohn's disease, and primary sclerosing cholangitis with suspected autoimmune hepatitis overlap. Over the summer he had some episodic diarrhea but in general he feels like things have been controlled fairly well with respect to his Crohn's disease. He has multiple dietary intolerances he tells me. His weight has been stable. His energy level has been okay. Eyes any fever or chills. Has not yet had a flu vaccine.  Allergies  Allergen Reactions  . Asacol (Mesalamine)   . Azathioprine   . Mycophenolate Mofetil   . Other     NUTS  . Protein     Agent: Additives and preservatives  . Wheat Bran    Outpatient Prescriptions Prior to Visit  Medication Sig Dispense Refill  . ALPRAZolam (XANAX) 0.5 MG tablet Take 0.5 mg by mouth at bedtime as needed.        Marland Kitchen CALCIUM-MAGNESIUM PO Take 1,000 mg by mouth daily. 500 mg magnesium also, 1 teaspoon of powder       . cholecalciferol (VITAMIN D) 1000 UNITS tablet Take 1,000 Units by mouth daily.        . predniSONE (DELTASONE) 20 MG tablet 1/4 tablet 5 mg daily  30 tablet  1  . vitamin C (ASCORBIC ACID) 500 MG tablet Take 500 mg by mouth daily.         Facility-Administered Medications Prior to Visit  Medication Dose Route Frequency Provider Last Rate Last Dose  . 0.9 %  sodium chloride infusion  500 mL Intravenous Continuous [name], MD       Last reviewed on 01/18/2012  3:40 PM by [name], MD Past Medical History  Diagnosis Date  . Crohn's disease of small and large intestines   . Hypercholesterolemia   . History of alcohol abuse   . History of substance abuse   . Depression   . 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 Primary sclerosing cholangitis     ? hepatitis overlap - liver bx x 2 and MRCP  . Allergy     SEASONAL  .  Anxiety   . Cataract     BILATERAL/REMOVED  . Hypertension   . Personal history of adenomatous colonic polyps 01/18/2012    12/2010 - 8 mm serrated adenoma of rectum   Past Surgical History  Procedure Date  . Cataract extraction, bilateral   . Pylonidal cyst removal   . Foot surgery     right  . Percutaneous liver biopsy 2007 and 2008  . Colonoscopy 2001, 05/02/2003, 01/28/11    2012: Right colon Crohn's, rectal polyp  . Esophagogastroduodenoscopy 01/28/11    Normal    Review of Systems Recent sinusitis and allergy flare    Objective:   Physical Exam General: NAD  Eyes: anicteric  Lungs: clear  Heart: S1S2 no rubs, murmurs or gallops  Abdomen: soft and nontender, BS+  Ext: no edema    Assessment & Plan:   1. Crohn's disease, small and large intestine   Seems stable overall on very low dose prednisone. He is intolerant of other medications including mesalamine derivatives, immune modulators. He is not inclined to try other therapy. Most recent colonoscopy did show mildly active disease in the right colon, 01/19/2011.   2. Primary sclerosing cholangitis ?  autoimmune hepatitis overlap   Recheck labs including metabolic panel and coags today.  Flu shot  I've advised that he given pneumonia vaccine to his primary care physician  I double checked he is immune to hepatitis A and B.  Anticipate referral to the [facility] liver clinic, [facility] location  3. Vitamin D deficiency   Level is okay last year, he is at risk given his chronic liver problems, we'll recheck level  He also has osteopenia of the hips - we have previously discussed bisphosphonate therapy and given his multiple medication intolerances and suspicions of these this has not been undertaken.  he could be a candidate for  IV therapy given osteopenia in setting of steroids and liver problems - will revisit and plan for repeat DEXA 2 years from last    CC: [name], MD

## 2012-01-18 NOTE — Patient Instructions (Addendum)
Your physician has requested that you go to the basement for lab work today.  Today you have been given a flu vaccine.  Contact your PCP regarding getting a pneumovax vaccine.   We have sent the following medications to your pharmacy for you to pick up at your convenience: Prednisone  We will be in touch when lab work results are in.  Dr. Celesta Aver nurse will contact you regarding the liver clinic information.   Thank you for choosing me and [facility].  [name], M.D., [facility]

## 2012-01-19 LAB — VITAMIN D 25 HYDROXY (VIT D DEFICIENCY, FRACTURES): Vit D, 25-Hydroxy: 33 ng/mL (ref 30–89)

## 2012-01-20 ENCOUNTER — Telehealth: Payer: Self-pay | Admitting: Internal Medicine

## 2012-01-20 ENCOUNTER — Other Ambulatory Visit: Payer: Self-pay | Admitting: Internal Medicine

## 2012-01-20 NOTE — Telephone Encounter (Signed)
Contacted [facility] and gave them verbally the Prednisone Rx  From the 01/18/12 office visit.  Informed and apology to patient for the mix up.

## 2012-01-23 NOTE — Progress Notes (Signed)
Quick Note:  LFT's are a bit worse Vit D level is low NL  1) MRI/MRCP liver to follow-up PSC and abnormal right lobe of liver (use PSC and 793.3 dx)please 2) vit D supplement 2000 IU daily 3) cc PCP on these results and notes ______

## 2012-01-25 ENCOUNTER — Other Ambulatory Visit: Payer: Self-pay

## 2012-01-25 DIAGNOSIS — R933 Abnormal findings on diagnostic imaging of other parts of digestive tract: Secondary | ICD-10-CM

## 2012-01-25 DIAGNOSIS — K8301 Primary sclerosing cholangitis: Secondary | ICD-10-CM

## 2012-01-25 MED ORDER — VITAMIN D 1000 UNITS PO TABS: 2000.0000 [IU] | ORAL_TABLET | Freq: Every day | ORAL | Status: DC

## 2012-01-31 ENCOUNTER — Telehealth: Payer: Self-pay | Admitting: Internal Medicine

## 2012-01-31 NOTE — Telephone Encounter (Signed)
Patient has a 6 day history of diarrhea and bloody stools.  He has tried a bland diet of eggs and yogurt.  He increased his prednisone to 10 mg a day since last Friday.  He is now only eating yogurt.  Minimal amount of blood now.  He is going to try and eat some eggs this pm, but not able to tolerate since last week.  [name] please advise next step.

## 2012-01-31 NOTE — Telephone Encounter (Signed)
Go to 40 mg daily on the prednisone and call back 1 week

## 2012-01-31 NOTE — Telephone Encounter (Signed)
Left message for patient to call back  

## 2012-01-31 NOTE — Telephone Encounter (Signed)
Patient advised.  He will call back next week with an update.

## 2012-02-02 ENCOUNTER — Ambulatory Visit
Admission: RE | Admit: 2012-02-02 | Discharge: 2012-02-02 | Disposition: A | Discharge status: Home / Self Care | Payer: Medicare Other | Source: Ambulatory Visit | Attending: Internal Medicine | Admitting: Internal Medicine

## 2012-02-02 DIAGNOSIS — R935 Abnormal findings on diagnostic imaging of other abdominal regions, including retroperitoneum: Secondary | ICD-10-CM | POA: Diagnosis not present

## 2012-02-02 DIAGNOSIS — K7460 Unspecified cirrhosis of liver: Secondary | ICD-10-CM | POA: Diagnosis not present

## 2012-02-02 DIAGNOSIS — K8301 Primary sclerosing cholangitis: Secondary | ICD-10-CM

## 2012-02-02 DIAGNOSIS — K8309 Other cholangitis: Secondary | ICD-10-CM | POA: Diagnosis not present

## 2012-02-02 DIAGNOSIS — R933 Abnormal findings on diagnostic imaging of other parts of digestive tract: Secondary | ICD-10-CM

## 2012-02-02 MED ORDER — GADOBENATE DIMEGLUMINE 529 MG/ML IV SOLN
15.0000 mL | Freq: Once | INTRAVENOUS | Status: AC | PRN
  Administered 2012-02-02: 15 mL via INTRAVENOUS

## 2012-02-07 ENCOUNTER — Telehealth: Payer: Self-pay | Admitting: Internal Medicine

## 2012-02-07 NOTE — Telephone Encounter (Signed)
[name] is it ok for his to stay on 40 mg for one more week?  Please advise

## 2012-02-08 NOTE — Telephone Encounter (Signed)
Patient advised [facility] has received the referral, but have not scheduled him yet.

## 2012-02-08 NOTE — Telephone Encounter (Signed)
Also let him know MRCP shows some more fibrosis or scarring of liver but no masses/cancers seen. Did he get liver appointment ?  Ok if not set up yet I will need to know just to give the MD's a heads up

## 2012-02-08 NOTE — Progress Notes (Signed)
Quick Note:  Patient informed via phone - see other notes ______

## 2012-02-08 NOTE — Telephone Encounter (Signed)
Yes and call back in a week  Also need to line up an office visit in Jan

## 2012-02-10 MED ORDER — PREDNISONE 20 MG PO TABS: ORAL_TABLET | ORAL | Status: DC

## 2012-02-10 NOTE — Telephone Encounter (Signed)
rx sent again.  His mother is notified

## 2012-02-14 ENCOUNTER — Telehealth: Payer: Self-pay

## 2012-02-14 NOTE — Telephone Encounter (Signed)
Fax came thru requesting more prednisone.  Per Barbera Setters, RN for [name] we are not refilling at this time, we will adjust next week based on his symptoms.  Faxed this message back to [facility] in [place] at [phone]

## 2012-02-15 ENCOUNTER — Telehealth: Payer: Self-pay | Admitting: Internal Medicine

## 2012-02-15 DIAGNOSIS — R197 Diarrhea, unspecified: Secondary | ICD-10-CM

## 2012-02-15 MED ORDER — METRONIDAZOLE 500 MG PO TABS: 500.0000 mg | ORAL_TABLET | Freq: Four times a day (QID) | ORAL | Status: DC

## 2012-02-15 NOTE — Telephone Encounter (Signed)
Patient aware He asked that I send the flagyl to [facility] on [address], I have cancelled the rx with [facility] and resent to [facility] He will come pick up container for cdiff.

## 2012-02-15 NOTE — Telephone Encounter (Signed)
Patient reports diarrhea 4-5 times a day, gas and bloating, "worn out".  Currently on 40 mg of Prednisone.  He is scheduled to come in and see you on Friday at 11:00, but he is asking if he needs flagyl.  He states you have prescribed this in the past for similar episodes.  I also want to confirm you want me to cancel the referral to the hepatology clinic?

## 2012-02-15 NOTE — Telephone Encounter (Signed)
1) C diff PCR 2) metronidazole 500 mg qid x 14 days 3) cancel hepatology referral for now

## 2012-02-16 ENCOUNTER — Other Ambulatory Visit: Payer: Medicare Other

## 2012-02-16 DIAGNOSIS — R197 Diarrhea, unspecified: Secondary | ICD-10-CM

## 2012-02-18 ENCOUNTER — Encounter: Payer: Self-pay | Admitting: Internal Medicine

## 2012-02-18 ENCOUNTER — Ambulatory Visit (INDEPENDENT_AMBULATORY_CARE_PROVIDER_SITE_OTHER): Payer: Medicare Other | Admitting: Internal Medicine

## 2012-02-18 ENCOUNTER — Other Ambulatory Visit (INDEPENDENT_AMBULATORY_CARE_PROVIDER_SITE_OTHER): Payer: Medicare Other

## 2012-02-18 VITALS — BP 102/74 | HR 74 | Ht 73.0 in | Wt 149.2 lb

## 2012-02-18 DIAGNOSIS — K5080 Crohn's disease of both small and large intestine without complications: Secondary | ICD-10-CM

## 2012-02-18 DIAGNOSIS — K8309 Other cholangitis: Secondary | ICD-10-CM

## 2012-02-18 DIAGNOSIS — K754 Autoimmune hepatitis: Secondary | ICD-10-CM | POA: Diagnosis not present

## 2012-02-18 DIAGNOSIS — K8301 Primary sclerosing cholangitis: Secondary | ICD-10-CM

## 2012-02-18 LAB — HEPATIC FUNCTION PANEL
ALT: 47 U/L (ref 0–53)
Albumin: 2.7 g/dL — ABNORMAL LOW (ref 3.5–5.2)
Total Protein: 6.1 g/dL (ref 6.0–8.3)

## 2012-02-18 NOTE — Patient Instructions (Addendum)
Your physician has requested that you go to the basement for lab work.  No medicine changes today.  We will be in contact with you regarding setting up the biologics therapy.  Follow up with [place] in 2 months.  Thank you for choosing me and [facility].  [name], M.D., [facility]

## 2012-02-18 NOTE — Progress Notes (Signed)
Subjective:    Patient ID: [name], male    DOB: 1966/01/09, 46 y.o.   MRN: [id number]  HPI The patient returns for followup of Crohn's disease , primary sclerosing cholangitis autoimmune hepatitis. He has been having diarrhea problems of late with some crampy abdominal pain and has had increase prednisone from 5 mg to 40 mg daily for the last 2 weeks. He is noticing some improvement. Metronidazole has been added. He is not having leading but he is been having quite a bit of diarrhea and food intolerance. No fevers reported. He does have some crampy right-sided abdominal pain. Medications, allergies, past medical history, past surgical history, family history and social history are reviewed and updated in the EMR. Allergies  Allergen Reactions  . Asacol (Mesalamine)   . Azathioprine   . Mycophenolate Mofetil   . Other     NUTS  . Protein     Agent: Additives and preservatives  . Wheat Bran    Outpatient Prescriptions Prior to Visit  Medication Sig Dispense Refill  . ALPRAZolam (XANAX) 0.5 MG tablet Take 0.5 mg by mouth at bedtime as needed.        Marland Kitchen CALCIUM-MAGNESIUM PO Take 1,000 mg by mouth daily. 500 mg magnesium also, 1 teaspoon of powder       . cholecalciferol (VITAMIN D) 1000 UNITS tablet Take 2 tablets (2,000 Units total) by mouth daily.      . metroNIDAZOLE (FLAGYL) 500 MG tablet Take 1 tablet (500 mg total) by mouth 4 (four) times daily.  56 tablet  0  . vitamin C (ASCORBIC ACID) 500 MG tablet Take 500 mg by mouth daily.        .  predniSONE (DELTASONE) 20 MG tablet Take 2 tablets daily  30 tablet  1                       Last reviewed on 02/18/2012 11:28 AM by [name], MD Past Medical History  Diagnosis Date  . Crohn's disease of small and large intestines   . Hypercholesterolemia   . History of alcohol abuse   . History of substance abuse   . Depression   . 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 Primary sclerosing cholangitis     ? hepatitis  overlap - liver bx x 2 and MRCP  . Allergy     SEASONAL  . Anxiety   . Cataract     BILATERAL/REMOVED  . Hypertension   . Personal history of adenomatous colonic polyps 01/18/2012    12/2010 - 8 mm serrated adenoma of rectum   Past Surgical History  Procedure Date  . Cataract extraction, bilateral   . Pylonidal cyst removal   . Foot surgery     right  . Percutaneous liver biopsy 2007 and 2008  . Colonoscopy 2001, 05/02/2003, 01/28/11    2012: Right colon Crohn's, rectal polyp  . Esophagogastroduodenoscopy 01/28/11    Normal     Review of Systems Some increasing anxiety level    Objective:   Physical Exam Mildly ill Anicteric Lungs clear Heart S1S2 no rmg abd soft and nontender Ext no edema   I reviewed his recent MRI which showed stable liver disease but it did show inflammatory changes in the right colon    Assessment & Plan:   1. Crohn's disease, small and large intestine   2. Primary sclerosing cholangitis   3. Autoimmune hepatitis    1.  Continue current dose of prednisone 2. We discussed and he is prepared to start biologic therapy will see what is in his or biliary i.e. which is most cost effective. I reviewed the risks benefits and indications including risks of significant infection and malignancy. 3. Hold off on any liver evaluation, we were discussing a tertiary center evaluation with the [facility] liver [facility] here in [place] 4. Recheck LFTs, check TB gold study, check hepatitis B surface antigen though he has showed immunity to hepatitis B I want to be complete prior to starting any biologic therapy 5. Turned in 2 months, sooner if needed 6. I did advise and did reduce prednisone to 30 mg daily if he felt like he was improving significantly next week  CC: [name], MD

## 2012-02-22 LAB — QUANTIFERON TB GOLD ASSAY (BLOOD)

## 2012-02-24 ENCOUNTER — Other Ambulatory Visit: Payer: Self-pay | Admitting: Internal Medicine

## 2012-02-25 ENCOUNTER — Telehealth: Payer: Self-pay

## 2012-02-25 MED ORDER — ADALIMUMAB 40 MG/0.8ML ~~LOC~~ KIT: PACK | SUBCUTANEOUS | Status: DC

## 2012-02-25 MED ORDER — ADALIMUMAB 40 MG/0.8ML ~~LOC~~ KIT: 40.0000 mg | PACK | Freq: Once | SUBCUTANEOUS | Status: DC

## 2012-02-25 NOTE — Telephone Encounter (Signed)
Quantiferon was drawn while the patient was on prednisone, the result is indeterminate.  I sent rx for Humira to check coverage.  I have left a message for the patient to call back to discuss further

## 2012-02-25 NOTE — Telephone Encounter (Signed)
Humira is preferred on his plan and can get for $3 co pay.  He is advised that I have sent the rx to his local pharmacy and that I will contact him once I get the prior auth approved.  He will come by and pick up the information and teaching packet.  [name] is it ok for him to start on Humira with an indeterminate quantiferon result.  It will take I think 4-6 weeks off of prednisone for the result to be accurate.

## 2012-02-25 NOTE — Telephone Encounter (Signed)
Message copied by Marlon Pel on Fri Feb 25, 2012  9:48 AM ------      Message from: HUNT, [place] R      Created: Mon Feb 21, 2012 11:34 AM      Regarding: FW: needs biologic therapy                   ----- Message -----         From: [name], MD         Sent: 02/18/2012   5:53 PM           To: Maury Dus, RN      Subject: needs biologic therapy                                   He needs to start biologic therapy - waiting on TB gold test            Please find out what would preferred be per his insurance - Remicade vs Humira vs Cimzia            Thanks

## 2012-02-25 NOTE — Progress Notes (Signed)
Quick Note:  If we have any ppd stuff we can do that ______

## 2012-02-29 NOTE — Telephone Encounter (Signed)
Prior authorization initiated

## 2012-03-02 NOTE — Telephone Encounter (Signed)
Humira approved by [employer].  I have contacted the patient and asked that he contact the pharmacy to see if they have it in stock and price.  He is asked to call me if he has a question or needs it sent to an alternate pharmacy.  He is also asked to call me once he has the meds to set up teaching.

## 2012-03-03 ENCOUNTER — Telehealth: Payer: Self-pay | Admitting: Internal Medicine

## 2012-03-03 NOTE — Telephone Encounter (Signed)
Patient will come for teaching at 3:00 on 03/06/12

## 2012-03-03 NOTE — Progress Notes (Signed)
Quick Note:  Have him do a CXR on Monday (PA and Lateral - pre-treatment r/o signs of TB) and if clear will go ahead with Tx The TB risk assessment tool is all negative for him. I believe we are as good as we were with a negative PPD in setting of high-dose prednisone. ______

## 2012-03-06 ENCOUNTER — Telehealth: Payer: Self-pay | Admitting: Internal Medicine

## 2012-03-06 ENCOUNTER — Other Ambulatory Visit: Payer: Self-pay

## 2012-03-06 ENCOUNTER — Ambulatory Visit (INDEPENDENT_AMBULATORY_CARE_PROVIDER_SITE_OTHER)
Admission: RE | Admit: 2012-03-06 | Discharge: 2012-03-06 | Disposition: A | Discharge status: Home / Self Care | Payer: Medicare Other | Source: Ambulatory Visit | Attending: Internal Medicine | Admitting: Internal Medicine

## 2012-03-06 DIAGNOSIS — K5090 Crohn's disease, unspecified, without complications: Secondary | ICD-10-CM | POA: Diagnosis not present

## 2012-03-06 DIAGNOSIS — R7611 Nonspecific reaction to tuberculin skin test without active tuberculosis: Secondary | ICD-10-CM | POA: Diagnosis not present

## 2012-03-06 NOTE — Telephone Encounter (Signed)
Patient advised.

## 2012-03-06 NOTE — Progress Notes (Signed)
Quick Note:  CXR ok re: no TB I think other changes are because he is tall and thin ______

## 2012-03-06 NOTE — Telephone Encounter (Signed)
noted 

## 2012-03-07 ENCOUNTER — Telehealth: Payer: Self-pay | Admitting: Internal Medicine

## 2012-03-07 NOTE — Telephone Encounter (Signed)
Patient will come tomorrow at 2:00 for teaching

## 2012-03-08 ENCOUNTER — Ambulatory Visit: Payer: Medicare Other | Admitting: Internal Medicine

## 2012-03-08 DIAGNOSIS — K5090 Crohn's disease, unspecified, without complications: Secondary | ICD-10-CM

## 2012-03-09 ENCOUNTER — Encounter: Payer: Self-pay | Admitting: Internal Medicine

## 2012-03-10 NOTE — Progress Notes (Signed)
The patient was here for Humira self injections teaching.  The patient and his mother verbally and properly demonstrated proper aseptic injection technique.  The patient verbalized understanding of when it is not safe for him to take Humira.  He will call me back for any questions or concerns

## 2012-03-15 ENCOUNTER — Encounter: Payer: Self-pay | Admitting: Internal Medicine

## 2012-03-15 DIAGNOSIS — R5381 Other malaise: Secondary | ICD-10-CM | POA: Diagnosis not present

## 2012-03-15 DIAGNOSIS — B354 Tinea corporis: Secondary | ICD-10-CM | POA: Diagnosis not present

## 2012-03-15 DIAGNOSIS — R5383 Other fatigue: Secondary | ICD-10-CM | POA: Diagnosis not present

## 2012-03-15 DIAGNOSIS — R197 Diarrhea, unspecified: Secondary | ICD-10-CM | POA: Diagnosis not present

## 2012-03-15 DIAGNOSIS — R634 Abnormal weight loss: Secondary | ICD-10-CM | POA: Diagnosis not present

## 2012-03-16 NOTE — Telephone Encounter (Signed)
[name] see the note from [facility] and advise

## 2012-03-17 ENCOUNTER — Encounter: Payer: Self-pay | Admitting: Internal Medicine

## 2012-03-17 NOTE — Telephone Encounter (Signed)
My weight is about 130-135. I think we need to think about admitting me to the hospital. I am starting to give up.      [name] see the response from [place].  I have the labs from Dr. [name] and they are on your desk.

## 2012-03-23 ENCOUNTER — Telehealth: Payer: Self-pay

## 2012-03-23 NOTE — Telephone Encounter (Signed)
Spoke to patient and set up appointment for next week as [name] requested.

## 2012-03-25 ENCOUNTER — Inpatient Hospital Stay (HOSPITAL_COMMUNITY)
Admission: AD | Admit: 2012-03-25 | Discharge: 2012-04-06 | DRG: 385 | Disposition: A | Discharge status: Home Health | Payer: Medicare Other | Source: Ambulatory Visit | Attending: Internal Medicine | Admitting: Internal Medicine

## 2012-03-25 ENCOUNTER — Encounter (HOSPITAL_COMMUNITY): Payer: Self-pay | Admitting: *Deleted

## 2012-03-25 DIAGNOSIS — K5090 Crohn's disease, unspecified, without complications: Secondary | ICD-10-CM | POA: Diagnosis not present

## 2012-03-25 DIAGNOSIS — D696 Thrombocytopenia, unspecified: Secondary | ICD-10-CM | POA: Diagnosis present

## 2012-03-25 DIAGNOSIS — E871 Hypo-osmolality and hyponatremia: Secondary | ICD-10-CM | POA: Diagnosis present

## 2012-03-25 DIAGNOSIS — R7309 Other abnormal glucose: Secondary | ICD-10-CM | POA: Diagnosis not present

## 2012-03-25 DIAGNOSIS — F329 Major depressive disorder, single episode, unspecified: Secondary | ICD-10-CM | POA: Diagnosis present

## 2012-03-25 DIAGNOSIS — E876 Hypokalemia: Secondary | ICD-10-CM | POA: Diagnosis not present

## 2012-03-25 DIAGNOSIS — Z800 Family history of malignant neoplasm of digestive organs: Secondary | ICD-10-CM

## 2012-03-25 DIAGNOSIS — Z85828 Personal history of other malignant neoplasm of skin: Secondary | ICD-10-CM | POA: Diagnosis not present

## 2012-03-25 DIAGNOSIS — D126 Benign neoplasm of colon, unspecified: Secondary | ICD-10-CM | POA: Diagnosis not present

## 2012-03-25 DIAGNOSIS — I10 Essential (primary) hypertension: Secondary | ICD-10-CM | POA: Diagnosis present

## 2012-03-25 DIAGNOSIS — R1084 Generalized abdominal pain: Secondary | ICD-10-CM

## 2012-03-25 DIAGNOSIS — E7800 Pure hypercholesterolemia, unspecified: Secondary | ICD-10-CM | POA: Diagnosis present

## 2012-03-25 DIAGNOSIS — R5381 Other malaise: Secondary | ICD-10-CM | POA: Diagnosis present

## 2012-03-25 DIAGNOSIS — E46 Unspecified protein-calorie malnutrition: Secondary | ICD-10-CM | POA: Diagnosis not present

## 2012-03-25 DIAGNOSIS — E860 Dehydration: Secondary | ICD-10-CM | POA: Diagnosis not present

## 2012-03-25 DIAGNOSIS — K8309 Other cholangitis: Secondary | ICD-10-CM

## 2012-03-25 DIAGNOSIS — K921 Melena: Secondary | ICD-10-CM | POA: Diagnosis present

## 2012-03-25 DIAGNOSIS — Z681 Body mass index (BMI) 19 or less, adult: Secondary | ICD-10-CM

## 2012-03-25 DIAGNOSIS — I959 Hypotension, unspecified: Secondary | ICD-10-CM | POA: Diagnosis not present

## 2012-03-25 DIAGNOSIS — K7460 Unspecified cirrhosis of liver: Secondary | ICD-10-CM | POA: Diagnosis not present

## 2012-03-25 DIAGNOSIS — E43 Unspecified severe protein-calorie malnutrition: Secondary | ICD-10-CM | POA: Diagnosis present

## 2012-03-25 DIAGNOSIS — Z87891 Personal history of nicotine dependence: Secondary | ICD-10-CM

## 2012-03-25 DIAGNOSIS — F411 Generalized anxiety disorder: Secondary | ICD-10-CM | POA: Diagnosis not present

## 2012-03-25 DIAGNOSIS — K551 Chronic vascular disorders of intestine: Secondary | ICD-10-CM | POA: Diagnosis present

## 2012-03-25 DIAGNOSIS — Z79899 Other long term (current) drug therapy: Secondary | ICD-10-CM

## 2012-03-25 DIAGNOSIS — K8301 Primary sclerosing cholangitis: Secondary | ICD-10-CM

## 2012-03-25 DIAGNOSIS — F3289 Other specified depressive episodes: Secondary | ICD-10-CM

## 2012-03-25 DIAGNOSIS — IMO0002 Reserved for concepts with insufficient information to code with codable children: Secondary | ICD-10-CM | POA: Diagnosis not present

## 2012-03-25 DIAGNOSIS — K5190 Ulcerative colitis, unspecified, without complications: Secondary | ICD-10-CM | POA: Diagnosis not present

## 2012-03-25 DIAGNOSIS — K635 Polyp of colon: Secondary | ICD-10-CM

## 2012-03-25 DIAGNOSIS — K5000 Crohn's disease of small intestine without complications: Secondary | ICD-10-CM | POA: Diagnosis not present

## 2012-03-25 DIAGNOSIS — K5080 Crohn's disease of both small and large intestine without complications: Principal | ICD-10-CM

## 2012-03-25 DIAGNOSIS — Z4682 Encounter for fitting and adjustment of non-vascular catheter: Secondary | ICD-10-CM | POA: Diagnosis not present

## 2012-03-25 DIAGNOSIS — K559 Vascular disorder of intestine, unspecified: Secondary | ICD-10-CM

## 2012-03-25 DIAGNOSIS — D649 Anemia, unspecified: Secondary | ICD-10-CM | POA: Diagnosis not present

## 2012-03-25 DIAGNOSIS — F419 Anxiety disorder, unspecified: Secondary | ICD-10-CM

## 2012-03-25 DIAGNOSIS — R5383 Other fatigue: Secondary | ICD-10-CM | POA: Diagnosis not present

## 2012-03-25 HISTORY — DX: Chronic vascular disorders of intestine: K55.1

## 2012-03-25 LAB — COMPREHENSIVE METABOLIC PANEL
BUN: 19 mg/dL (ref 6–23)
CO2: 25 mEq/L (ref 19–32)
Calcium: 7.8 mg/dL — ABNORMAL LOW (ref 8.4–10.5)
Creatinine, Ser: 0.57 mg/dL (ref 0.50–1.35)
GFR calc Af Amer: >90 mL/min (ref >90)
GFR calc non Af Amer: >90 mL/min (ref >90)
Glucose, Bld: 87 mg/dL (ref 70–99)

## 2012-03-25 LAB — CBC WITH DIFFERENTIAL/PLATELET
Basophils Absolute: 0 10*3/uL (ref 0.0–0.1)
Eosinophils Absolute: 0 10*3/uL (ref 0.0–0.7)
Lymphocytes Relative: 3 % — ABNORMAL LOW (ref 12–46)
Monocytes Relative: 3 % (ref 3–12)
Platelets: 141 10*3/uL — ABNORMAL LOW (ref 150–400)
RDW: 13.8 % (ref 11.5–15.5)
WBC Morphology: INCREASED
WBC: 9.6 10*3/uL (ref 4.0–10.5)

## 2012-03-25 LAB — MAGNESIUM: Magnesium: 1.9 mg/dL (ref 1.5–2.5)

## 2012-03-25 LAB — PROTIME-INR: INR: 1.06 (ref 0.00–1.49)

## 2012-03-25 LAB — PHOSPHORUS: Phosphorus: 2.4 mg/dL (ref 2.3–4.6)

## 2012-03-25 MED ORDER — KCL IN DEXTROSE-NACL 20-5-0.9 MEQ/L-%-% IV SOLN
INTRAVENOUS | Status: DC
  Administered 2012-03-25: 22:00:00 via INTRAVENOUS
  Administered 2012-03-26: 150 mL/h via INTRAVENOUS
  Administered 2012-03-26: 04:00:00 via INTRAVENOUS
  Filled 2012-03-25 (×5): qty 1000

## 2012-03-25 MED ORDER — MIRTAZAPINE 7.5 MG PO TABS
7.5000 mg | ORAL_TABLET | Freq: Every day | ORAL | Status: DC
  Administered 2012-03-25: 7.5 mg via ORAL
  Filled 2012-03-25 (×3): qty 1

## 2012-03-25 MED ORDER — SODIUM CHLORIDE 0.9 % IV BOLUS (SEPSIS)
2000.0000 mL | Freq: Once | INTRAVENOUS | Status: AC
  Administered 2012-03-25 (×2): 1000 mL via INTRAVENOUS

## 2012-03-25 MED ORDER — HYDROCORTISONE 2.5 % RE CREA
TOPICAL_CREAM | Freq: Two times a day (BID) | RECTAL | Status: DC
  Administered 2012-03-26: 10:00:00 via TOPICAL
  Administered 2012-03-27: 1 via TOPICAL
  Administered 2012-03-28 – 2012-04-05 (×5): via TOPICAL
  Filled 2012-03-25 (×2): qty 28.35

## 2012-03-25 MED ORDER — ONDANSETRON HCL 4 MG PO TABS: 4.0000 mg | ORAL_TABLET | Freq: Four times a day (QID) | ORAL | Status: DC | PRN

## 2012-03-25 MED ORDER — METHYLPREDNISOLONE SODIUM SUCC 40 MG IJ SOLR
40.0000 mg | Freq: Two times a day (BID) | INTRAMUSCULAR | Status: DC
  Administered 2012-03-26 – 2012-03-30 (×9): 40 mg via INTRAVENOUS
  Filled 2012-03-25 (×15): qty 1

## 2012-03-25 MED ORDER — ONDANSETRON HCL 4 MG/2ML IJ SOLN: 4.0000 mg | Freq: Four times a day (QID) | INTRAMUSCULAR | Status: DC | PRN

## 2012-03-25 MED ORDER — SODIUM CHLORIDE 0.9 % IV BOLUS (SEPSIS): 1000.0000 mL | Freq: Once | INTRAVENOUS | Status: DC

## 2012-03-25 MED ORDER — PANTOPRAZOLE SODIUM 40 MG IV SOLR
40.0000 mg | INTRAVENOUS | Status: DC
  Administered 2012-03-25 – 2012-03-28 (×4): 40 mg via INTRAVENOUS
  Filled 2012-03-25 (×6): qty 40

## 2012-03-25 MED ORDER — THIAMINE HCL 100 MG/ML IJ SOLN
100.0000 mg | Freq: Every day | INTRAMUSCULAR | Status: DC
  Administered 2012-03-26 – 2012-03-28 (×3): 100 mg via INTRAVENOUS
  Filled 2012-03-25 (×6): qty 1

## 2012-03-25 MED ORDER — LORAZEPAM 2 MG/ML IJ SOLN: 1.0000 mg | Freq: Four times a day (QID) | INTRAMUSCULAR | Status: DC | PRN

## 2012-03-25 MED ORDER — MORPHINE SULFATE 2 MG/ML IJ SOLN: 2.0000 mg | INTRAMUSCULAR | Status: DC | PRN

## 2012-03-25 NOTE — H&P (Addendum)
[facility] Admission Note Primary Care Physician:  [name], MD Primary Gastroenterologist:  [name]   Reason for Admission/Chief Complain:  Weakness, Crohn's flare  Assessment:  1) Crohn's disease of colon and small bowel with exacerbation - bloody diarrhea. 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 autoimmune hepatitis overlap    3) Dehydration    4) Weight loss and malnutrition    5) Anorexia    6) Multiple drug and food sensitivities and intolerances - only drinks reverse osmosis water from [employer]     7) Suspect a component of depression     Plans:   1) Lab w/u - CBC, CMET, Thyroid studies, B12, prealbumin    2) IV rehydration    3) IV steroids    4) Mom will bring [place] legs from home  - about only thing he will eat    5) will try for some nutritional supplements - dietitian to see in AM    6) start mirtazipine - I think he is depressed again - will also discuss psychiatry evaluation    7) might need colonoscopy/imaging     HPI: [name] is a 47 y.o. male followed by me with Crohn's disease and PSC/possible hepatitis. He has been treated with prednisone only de to multiple drug intolerances. Had been relatively stable until last few months with increasing sxs RUQ pain and diarrhea. Has known Crohn's right colon area. Prednisone increased to 40 mg daily and had partial but limited response.Progressive sxs so Humira started. Has had first and second doses - last 1/22. Called today with increasing bloody diarrhea - 3-4 x a day. Only eating two [place] legs a day - mom chops them and puts them in reverse osmosis water. He has insomnia, anorexia and has not bathed in 1 week. Says he is very weak. No fevers or other constitutional sxs. Has abdominal pain after eating. Worse than ever. Ussing Xanax to sleep.   Past Medical History  Diagnosis Date  . Crohn's disease of small and large intestines   .  Hypercholesterolemia   . History of alcohol abuse   . History of substance abuse   . Depression   . 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 Primary sclerosing cholangitis     ? hepatitis overlap - liver bx x 2 and MRCP  . Allergy     SEASONAL  . Anxiety   . Cataract     BILATERAL/REMOVED  . Hypertension   . Personal history of adenomatous colonic polyps 01/18/2012    12/2010 - 8 mm serrated adenoma of rectum    Past Surgical History  Procedure Date  . Cataract extraction, bilateral   . Pylonidal cyst removal   . Foot surgery     right  . Percutaneous liver biopsy 2007 and 2008  . Colonoscopy 2001, 05/02/2003, 01/28/11    2012: Right colon Crohn's, rectal polyp  . Esophagogastroduodenoscopy 01/28/11    Normal    Prior to Admission medications   Medication Sig Start Date End Date Taking? Authorizing Provider  adalimumab (HUMIRA) 40 MG/0.8ML injection Inject 40 mg into the vein every 14 (fourteen) days. Inject one pen sq every other week after the completion of the starter pack 02/25/12  Yes [name], MD  ALPRAZolam Duanne Moron) 0.5 MG tablet Take 0.5 mg by mouth at bedtime as needed. sleep   Yes Historical Provider, MD  predniSONE (DELTASONE) 20 MG tablet  Take 15 mg by mouth 2 (two) times daily. Take as directed 02/24/12  Yes [name], MD    No current facility-administered medications for this encounter.    Allergies as of 03/25/2012 - Review Complete 03/25/2012  Allergen Reaction Noted  . Asacol (mesalamine)  03/09/2011  . Azathioprine  08/07/2010  . Mycophenolate mofetil  08/07/2010  . Other  01/18/2012  . Protein  08/07/2010  . Wheat bran  01/18/2012    Family History  Problem Relation Age of Onset  . Heart disease Father   . Thyroid cancer Father   . Breast cancer Mother   . Stomach cancer Mother   . Colon cancer Neg Hx     History   Social History  . Marital Status: Single    Spouse Name: N/A    Number of Children: 1   Occupational History   . Disability     Social History Main Topics  . Smoking status: Former Smoker    Types: Cigarettes  . Smokeless tobacco: Never Used  . Alcohol Use: No  . Drug Use: No - yes in past     Review of Systems: All other review of systems negative except as mentioned in the HPI.  Physical Exam: Vital signs in last 24 hours: Temp:  [97.7 F (36.5 C)] 97.7 F (36.5 C) (01/25 1720) Pulse Rate:  [95] 95  (01/25 1720) BP: (112)/(76) 112/76 mmHg (01/25 1720) SpO2:  [100 %] 100 % (01/25 1720) Weight:  [130 lb (58.968 kg)] 130 lb (58.968 kg) (01/25 1720) Last BM Date: 03/25/12 General:   Thin and pale, asthenic, chronically and acutely ill but not toxic Eyes:  Sclera clear, no icterus.   Conjunctiva pink. Mouth:  No deformity or lesions.  Oropharynx pale slightly dry Neck:  Supple; no masses or thyromegaly. Lungs:  Clear throughout to auscultation.   Heart:  Regular rate and rhythm; no murmurs, clicks, rubs,  or gallops. Abdomen:  Soft, nontender and nondistended. No masses, hepatosplenomegaly or hernias noted. Normal bowel sounds, without guarding, and without rebound.   Rectal:   Small hemorrhoid seen, tender with mild stenosis ? Mild stricture, brown stool. Prostate is NL Extremities:  Without clubbing or edema. Neurologic:  Alert and  oriented x 3 Skin:  Dry and mildly scaly with multiple nevi, no rash. Lymph Nodes:  No significant cervical, inguinal or supraclavicular adenopathy. Psych:  Awake but weak and slightly disengaged cooperative. Flat affect.      01/25/2012 MRCP 1. Slowly progressive hepatic fibrosis secondary to underlying  sclerosing cholangitis. No mass lesion is identified to suggest  cholangiocarcinoma.  2. Stable intrahepatic biliary dilatation and beading of the  common bile duct. Possible tiny gallstones. No evidence of  choledocholithiasis.  3. Colonic wall thickening and enhancement adjacent to the hepatic  flexure suspicious for active Crohn's  disease.   Previous Endoscopies:  01/28/2011 Colonoscopy 1) R Crohn's colitis 2) 8 mm pedunculated serrated adenoma of rectum 3) diverticulosis 4) NL TI   01/28/2011 EGD 1) Normal     LOS: 0 days      @Isabellarose Kope  Simonne Maffucci, MD, [facility] [phone] (pager) 03/25/2012 5:54 PM@

## 2012-03-26 ENCOUNTER — Inpatient Hospital Stay (HOSPITAL_COMMUNITY): Payer: Medicare Other

## 2012-03-26 ENCOUNTER — Encounter (HOSPITAL_COMMUNITY): Payer: Self-pay | Admitting: Radiology

## 2012-03-26 DIAGNOSIS — F3289 Other specified depressive episodes: Secondary | ICD-10-CM

## 2012-03-26 DIAGNOSIS — E46 Unspecified protein-calorie malnutrition: Secondary | ICD-10-CM

## 2012-03-26 DIAGNOSIS — F329 Major depressive disorder, single episode, unspecified: Secondary | ICD-10-CM

## 2012-03-26 LAB — CBC
HCT: 34.2 % — ABNORMAL LOW (ref 39.0–52.0)
Hemoglobin: 12.2 g/dL — ABNORMAL LOW (ref 13.0–17.0)
RBC: 3.86 MIL/uL — ABNORMAL LOW (ref 4.22–5.81)
WBC: 6.9 10*3/uL (ref 4.0–10.5)

## 2012-03-26 LAB — TSH: TSH: 1.267 u[IU]/mL (ref 0.350–4.500)

## 2012-03-26 LAB — BASIC METABOLIC PANEL
Chloride: 102 mEq/L (ref 96–112)
GFR calc Af Amer: >90 mL/min (ref >90)
GFR calc non Af Amer: >90 mL/min (ref >90)
Potassium: 3 mEq/L — ABNORMAL LOW (ref 3.5–5.1)
Sodium: 135 mEq/L (ref 135–145)

## 2012-03-26 LAB — PREALBUMIN: Prealbumin: 8.3 mg/dL — ABNORMAL LOW (ref 17.0–34.0)

## 2012-03-26 LAB — URINALYSIS, ROUTINE W REFLEX MICROSCOPIC
Hgb urine dipstick: NEGATIVE
Ketones, ur: 40 mg/dL — AB
Nitrite: NEGATIVE
pH: 6 (ref 5.0–8.0)

## 2012-03-26 LAB — GLUCOSE, CAPILLARY: Glucose-Capillary: 130 mg/dL — ABNORMAL HIGH (ref 70–99)

## 2012-03-26 MED ORDER — POTASSIUM CHLORIDE CRYS ER 20 MEQ PO TBCR
40.0000 meq | EXTENDED_RELEASE_TABLET | Freq: Once | ORAL | Status: AC
  Administered 2012-03-26: 40 meq via ORAL
  Filled 2012-03-26: qty 2

## 2012-03-26 MED ORDER — IOHEXOL 300 MG/ML  SOLN
25.0000 mL | INTRAMUSCULAR | Status: AC
  Administered 2012-03-26 (×2): 25 mL via ORAL

## 2012-03-26 MED ORDER — POLYETHYLENE GLYCOL 3350 17 G PO PACK
17.0000 g | PACK | ORAL | Status: AC
  Administered 2012-03-26 (×4): 17 g via ORAL
  Filled 2012-03-26 (×4): qty 1

## 2012-03-26 MED ORDER — VITAL AF 1.2 CAL PO LIQD
237.0000 mL | Freq: Three times a day (TID) | ORAL | Status: DC
  Administered 2012-03-26 – 2012-03-27 (×2): 237 mL via ORAL
  Filled 2012-03-26 (×16): qty 237

## 2012-03-26 MED ORDER — IOHEXOL 300 MG/ML  SOLN
100.0000 mL | Freq: Once | INTRAMUSCULAR | Status: AC | PRN
  Administered 2012-03-26: 100 mL via INTRAVENOUS

## 2012-03-26 MED ORDER — POTASSIUM CHLORIDE IN NACL 40-0.9 MEQ/L-% IV SOLN
INTRAVENOUS | Status: DC
  Administered 2012-03-26: 125 mL/h via INTRAVENOUS
  Administered 2012-03-27 (×2): via INTRAVENOUS
  Administered 2012-03-28: 125 mL/h via INTRAVENOUS
  Administered 2012-03-28 (×2): via INTRAVENOUS
  Administered 2012-03-29: 125 mL/h via INTRAVENOUS
  Filled 2012-03-26 (×14): qty 1000

## 2012-03-26 MED ORDER — POTASSIUM CHLORIDE CRYS ER 20 MEQ PO TBCR
40.0000 meq | EXTENDED_RELEASE_TABLET | Freq: Two times a day (BID) | ORAL | Status: AC
  Administered 2012-03-26 – 2012-03-27 (×3): 40 meq via ORAL
  Filled 2012-03-26 (×3): qty 2

## 2012-03-26 MED ORDER — MIRTAZAPINE 15 MG PO TABS
15.0000 mg | ORAL_TABLET | Freq: Every day | ORAL | Status: DC
  Administered 2012-03-26 – 2012-04-05 (×11): 15 mg via ORAL
  Filled 2012-03-26 (×14): qty 1

## 2012-03-26 NOTE — Progress Notes (Signed)
INITIAL NUTRITION ASSESSMENT  DOCUMENTATION CODES Per approved criteria  -Severe malnutrition in the context of chronic illness -Underweight   INTERVENTION:  Vital AF 1.2 tid-discussed with patient who is to drink this very slowly.    Continue diet per MD, currently CL with food from home.  RD to monitor closely  Patient at risk for refeeding syndrome.  Recommend monitor potassium, magnesium, phosphorus when patient begins to eat.  When refeeding recommend avoiding lactose, fiber, high fat, gluten.  Will try an elemental formula orally taken very slowly with Vital AF 1.2.  This product contains ingredients to help manage inflammation and promote GI tolerance.  NUTRITION DIAGNOSIS: Inadequate oral intake related to altered GI function as evidenced by malabsorption and underweight.   Goal: Tolerating elemental supplement and increased variety of foods.  Weight maintenance.  Monitor:  Diet and tolerance, oral supplement tolerance, labs, weight  Reason for Assessment: Consult secondary patient malnourished and many food sensitivities.  Order supplements  47 y.o. male  Admitting Dx: chrohn's ileo-colitis with flare, dehydration  ASSESSMENT: Patient with bloody diarrhea, patient on long term steroids with recent initiation of Humira.  Multiple drug and food sensitivities and intolerances-only drinks reverse osmosis water from [employer].  Mom will bring [place] legs from home- about the only thing that patient will eat.  Spoke with patient.  Parents in room.  Patient reported weight loss secondary to decrease in steroid dose.  He has started on Humara but stated it will take another 2-3 weeks to work.  A couple of months ago, diet hx included Breakfast:  3 scrambled eggs, Lunch:  Plain greek yogurt, peeled raw apples and plain peanuts, Dinner:  Beef, [place] or pork and green beans. Used a small amount of olive oil and salt.  Usually drank plain black tea. Intake since has continued to  decreased.  Pt reports an intolerance to milk (lactose), gluten, soy, potatoes, rice, sugar, whey protein, boost, quinoa, all other veges and fruits.  Unable to tolerate MVI, B vitamins, Vitamin E and A.  Was taking a vitamin D, Calcium and magnesium powder and a vitamin C powder.  Patient states that he would eat a simple diet and try one food at a time to check tolerance.  Patient is aware that there is also a phycological association with foods that he has not tolerated in the past and is willing to try new ideas while in the hospital.    Patient meets criteria for malnutrition related to chronic illness AEB by 33% weight loss in the last 2 months, severely decreased body fat and muscle mass and intake <50% for > one month.    Food intolerance related to chron's and the effects of malnutrition on the gut.    Height: Ht Readings from Last 1 Encounters:  03/25/12 6' 1"  (1.854 m)    Weight: Wt Readings from Last 1 Encounters:  03/25/12 132 lb 3.3 oz (59.968 kg)    Ideal Body Weight: 184 lbs, 84 kg  % Ideal Body Weight: 71  Wt Readings from Last 10 Encounters:  03/25/12 132 lb 3.3 oz (59.968 kg)  02/18/12 149 lb 3.2 oz (67.677 kg)  01/18/12 170 lb 6 oz (77.282 kg)  03/09/11 179 lb (81.194 kg)  01/28/11 175 lb (79.379 kg)  01/08/11 175 lb (79.379 kg)  08/10/10 174 lb 3.2 oz (79.017 kg)  02/27/10 188 lb (85.276 kg)  09/26/09 169 lb (76.658 kg)  07/21/09 163 lb (73.936 kg)    Usual Body Weight: 170  lbs 01/18/12  % Usual Body Weight: 78% of weight 2 months ago  BMI:  Body mass index is 17.44 kg/(m^2).  Estimated Nutritional Needs: Kcal: 1800-2000 Protein: 90-110 gm Fluid: >1.8L  Skin: WNL  Diet Order: Clear Liquid or food from home.  EDUCATION NEEDS: -Education needs addressed   Intake/Output Summary (Last 24 hours) at 03/26/12 1428 Last data filed at 03/26/12 1405  Gross per 24 hour  Intake 5602.5 ml  Output    400 ml  Net 5202.5 ml    Last BM:  1/26  Labs:   Lab 03/26/12 1006 03/25/12 1854  NA 135 131*  K 3.0* 3.5  CL 102 93*  CO2 26 25  BUN 9 19  CREATININE 0.48* 0.57  CALCIUM 7.1* 7.8*  MG -- 1.9  PHOS -- 2.4  GLUCOSE 141* 87    CBG (last 3)   Basename 03/26/12 0750  GLUCAP 130*    Scheduled Meds:   . hydrocortisone   Topical BID  . methylPREDNISolone (SOLU-MEDROL) injection  40 mg Intravenous Q12H  . mirtazapine  15 mg Oral QHS  . pantoprazole (PROTONIX) IV  40 mg Intravenous Q24H  . thiamine  100 mg Intravenous Daily    Continuous Infusions:   . dextrose 5 % and 0.9 % NaCl with KCl 20 mEq/L 150 mL/hr (03/26/12 1001)    Past Medical History  Diagnosis Date  . Crohn's disease of small and large intestines   . Hypercholesterolemia   . History of alcohol abuse   . History of substance abuse   . Depression   . 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 Primary sclerosing cholangitis     ? hepatitis overlap - liver bx x 2 and MRCP  . Allergy     SEASONAL  . Anxiety   . Cataract     BILATERAL/REMOVED  . Hypertension   . Personal history of adenomatous colonic polyps 01/18/2012    12/2010 - 8 mm serrated adenoma of rectum    Past Surgical History  Procedure Date  . Cataract extraction, bilateral   . Pylonidal cyst removal   . Foot surgery     right  . Percutaneous liver biopsy 2007 and 2008  . Colonoscopy 2001, 05/02/2003, 01/28/11    2012: Right colon Crohn's, rectal polyp  . Esophagogastroduodenoscopy 01/28/11    Normal    Antonieta Iba, RD, LDN Clinical Inpatient Dietitian Pager:  [phone] Weekend and after hours pager:  [phone]

## 2012-03-26 NOTE — Progress Notes (Addendum)
[facility] Progress Note  Patient Name: [name] Date of Encounter: 03/26/2012, 9:15 AM    Subjective  Improved overall - feels stronger. Had watery and bloody stool this AM Abd still uncomfortable Slept 3-4 hrs - SCD's were bothering   Objective    Physical Exam: Filed Vitals:   03/26/12 0530  BP: 112/74  Pulse: 78  Temp: 98.1 F (36.7 C)  Resp: 16   General: chronically ill but less so Lungs: clear Heart:  RRRS1S2 no murmur Abdomen:   Soft, BS +, tender with some upper abdominal fullness Extremities: no ededma Neuro: a and o Psych: less flat    Intake/Output Summary (Last 24 hours) at 03/26/12 0915 Last data filed at 03/26/12 5366  Gross per 24 hour  Intake   3395 ml  Output    400 ml  Net   2995 ml    Labs:  [facility] 03/25/12 1854  NA 131*  K 3.5  CL 93*  CO2 25  GLUCOSE 87  BUN 19  CREATININE 0.57  CALCIUM 7.8*  MG 1.9  PHOS 2.4    Basename 03/25/12 1854  AST 14  ALT 20  ALKPHOS 87  BILITOT 0.8  PROT 5.1*  ALBUMIN 2.1*     Basename 03/25/12 1854  WBC 9.6  NEUTROABS 9.0*  HGB 14.5  HCT 40.3  MCV 88.0  PLT 141*    Basename 03/25/12 1854  TSH 1.267  T4TOTAL --  T3FREE --  THYROIDAB --    Basename 03/25/12 1855  VITAMINB12 1299*  FOLATE --  FERRITIN --  TIBC --  IRON --  RETICCTPCT --   .Results for JERIAH, CORKUM (MRN [id number]) as of 03/26/2012 09:20  Ref. Range 03/25/2012 18:55  Prealbumin Latest Range: 17.0-34.0 mg/dL 8.3 (L)   Results for TYREZ, BERRIOS (MRN [id number]) as of 03/26/2012 09:20  Ref. Range 03/26/2012 01:23  Color, Urine Latest Range: YELLOW  YELLOW  APPearance Latest Range: CLEAR  CLEAR  Specific Gravity, Urine Latest Range: 1.005-1.030  1.024  pH Latest Range: 5.0-8.0  6.0  Glucose Latest Range: NEGATIVE mg/dL NEGATIVE  Bilirubin Urine Latest Range: NEGATIVE  MODERATE (A)  Ketones, ur Latest Range: NEGATIVE mg/dL 40 (A)  Protein Latest Range: NEGATIVE mg/dL  NEGATIVE  Urobilinogen, UA Latest Range: 0.0-1.0 mg/dL 0.2  Nitrite Latest Range: NEGATIVE  NEGATIVE  Leukocytes, UA Latest Range: NEGATIVE  NEGATIVE  Hgb urine dipstick Latest Range: NEGATIVE  NEGATIVE   Radiology/Studies:  Dg Chest 2 View  03/06/2012  *RADIOLOGY REPORT*  Clinical Data: Indeterminate TB test, evaluate for TB, history of long term steroid use  CHEST - 2 VIEW  Comparison: None.  Findings: The lungs are very hyperaerated most consistent with asthma.  No focal infiltrate or effusion is seen.  Mediastinal contours appear normal.  The heart is within normal limits in size. No bony abnormality is seen.  IMPRESSION: Hyperaeration.  Question asthma.  No active process is seen.   Original Report Authenticated By: Ivar Drape, M.D.      Assessment and Plan  1) Crohn's ileo-colitis with flare - continue Solumedrol - check CT abd/pelvis today  2) Dehydration - IVF's  3) Malnutrition - nutrition consult  4) PSC/AIH - LFT's normal  5) Depression - suspect - increase mirtazipine to 15 mg, psychiatry consult tomorrow - I have explained rationale he is ok, ? If they can help with his food and medication intolerance issues  [name], MD, [facility] [phone] (pager) 03/26/2012 9:21 AM  CT abd/pelvis today  1. Acute inflammation involving the terminal ileum and contiguous  involvement of the ascending colon with findings consistent with  active Crohn's disease. The ascending colon involvement appears  more significant than terminal ileum involvement. There likely is  some degree of stricture at the ileocecal valve. No significant  associated bowel obstruction is identified. There is no evidence  of bowel perforation or focal abscess. Some pockets of free fluid  are present in the retroperitoneum and pelvis.  2. Stable fibrotic changes of the liver related to the known  underlying primary sclerosing cholangitis and also depicted on the  recent MRI  study.  3. Probable moderate narrowing at the origin of the superior  mesenteric artery. This may not be of clinical significance to  bowel perfusion given widely patent celiac axis and inferior  mesenteric artery. Correlation is suggested with any symptoms  suggestive of intestinal angina. Formal CTA evaluation would  better depicted patency of the SMA.   Will plan for CT angio tomorrow - will need to pass contrast out - MiraLax x 4 doses  [name], MD, [facility] [phone] (pager) 03/26/2012 3:54 PM

## 2012-03-27 ENCOUNTER — Encounter (HOSPITAL_COMMUNITY): Payer: Self-pay | Admitting: Radiology

## 2012-03-27 ENCOUNTER — Inpatient Hospital Stay (HOSPITAL_COMMUNITY): Payer: Medicare Other

## 2012-03-27 DIAGNOSIS — R1084 Generalized abdominal pain: Secondary | ICD-10-CM

## 2012-03-27 DIAGNOSIS — K551 Chronic vascular disorders of intestine: Secondary | ICD-10-CM

## 2012-03-27 LAB — BASIC METABOLIC PANEL
BUN: 3 mg/dL — ABNORMAL LOW (ref 6–23)
Chloride: 107 mEq/L (ref 96–112)
Glucose, Bld: 100 mg/dL — ABNORMAL HIGH (ref 70–99)
Potassium: 3.8 mEq/L (ref 3.5–5.1)
Sodium: 137 mEq/L (ref 135–145)

## 2012-03-27 MED ORDER — IOHEXOL 350 MG/ML SOLN
100.0000 mL | Freq: Once | INTRAVENOUS | Status: AC | PRN
  Administered 2012-03-27: 100 mL via INTRAVENOUS

## 2012-03-27 NOTE — Progress Notes (Addendum)
[facility] Progress Note  SUBJECTIVE: Abdominal pain better but not really taking much PO. Abdominal pain was atypical for crohn's flare. Stools still loose with blood though a little less frequent now.   OBJECTIVE:  Vital signs in last 24 hours: Temp:  [97.2 F (36.2 C)-98.3 F (36.8 C)] 98 F (36.7 C) (01/27 0528) Pulse Rate:  [77-79] 77  (01/27 0528) Resp:  [16] 16  (01/27 0528) BP: (111-119)/(74-78) 119/78 mmHg (01/27 0528) SpO2:  [98 %-99 %] 99 % (01/27 0528) Weight:  [129 lb 14.4 oz (58.922 kg)] 129 lb 14.4 oz (58.922 kg) (01/27 0528) Last BM Date: 03/25/12 General:     Pleasant white male in NAD Heart:  Regular rate and rhythm Abdomen:  Soft, +tender, active bowel sounds. Extremities:  Without edema. SCDs off Neurologic:  Alert and oriented,  grossly normal neurologically. Psych:  Cooperative. Normal mood and affect.   Lab Results:  Basename 03/26/12 1006 03/25/12 1854  WBC 6.9 9.6  HGB 12.2* 14.5  HCT 34.2* 40.3  PLT 116* 141*   BMET  Basename 03/27/12 0355 03/26/12 1006 03/25/12 1854  NA 137 135 131*  K 3.8 3.0* 3.5  CL 107 102 93*  CO2 26 26 25   GLUCOSE 100* 141* 87  BUN 3* 9 19  CREATININE 0.54 0.48* 0.57  CALCIUM 7.6* 7.1* 7.8*   LFT  Basename 03/25/12 1854  PROT 5.1*  ALBUMIN 2.1*  AST 14  ALT 20  ALKPHOS 87  BILITOT 0.8  BILIDIR --  IBILI --   PT/INR  Basename 03/25/12 1854  LABPROT 13.7  INR 1.06    Studies/Results: Ct Abdomen Pelvis W Contrast  03/26/2012  *RADIOLOGY REPORT*  Clinical Data: Flare-up of Crohn's disease with bloody diarrhea, abdominal pain and weight loss.  The patient also has history of primary sclerosing cholangitis.  CT ABDOMEN AND PELVIS WITH CONTRAST  Technique:  Multidetector CT imaging of the abdomen and pelvis was performed following the standard protocol during bolus administration of intravenous contrast.  Contrast: 193m OMNIPAQUE IOHEXOL 300 MG/ML SOLN  Comparison: MRI with MRCP on 02/02/2012.   Findings: There is evidence of inflammation involving the terminal ileum and proximal colon consistent with active Crohn's disease. Distal segment of the terminal ileum shows hyperemia and distention and there likely is a component of stricture at the ileocecal valve based on CT appearance.  No associated small bowel obstruction is identified.  Involvement of the ascending colon noted with hyperemia, colonic wall thickening and diffuse edema of the colon and immediate surrounding pericolic tissues up to nearly the level of the hepatic flexure.  There is no evidence of bowel perforation or focal abscess.  No fistulas are identified by CT.  The liver shows stable chronic changes related to the primary sclerosing cholangitis with fibrotic changes again noted in the posterior right lobe and stable mild intrahepatic biliary ductal dilatation.  No focal enhancing hepatic masses are identified.  The portal vein is open and shows no evidence of thrombosis.  Some additional small amount of free fluid are noted in the right retroperitoneum and in the lower pelvis.  No enlarged lymph nodes are seen.  Atherosclerotic changes are seen involving the abdominal aorta without evidence of aneurysm.  Although the study was not optimal for arterial evaluation, there is suspected to be potentially a moderate degree of narrowing at the origin of the superior mesenteric artery which may or may not be significant.  The celiac axis and inferior mesenteric artery are widely patent.  The  The bladder is unremarkable.  No hernias are identified.  Bony structures are within normal limits.  IMPRESSION:  1.  Acute inflammation involving the terminal ileum and contiguous involvement of the ascending colon with findings consistent with active Crohn's disease.  The ascending colon involvement appears more significant than terminal ileum involvement.  There likely is some degree of stricture at the ileocecal valve.  No significant associated bowel  obstruction is identified.  There is no evidence of bowel perforation or focal abscess.  Some pockets of free fluid are present in the retroperitoneum and pelvis. 2.  Stable fibrotic changes of the liver related to the known underlying primary sclerosing cholangitis and also depicted on the recent MRI study. 3.  Probable moderate narrowing at the origin of the superior mesenteric artery.  This may not be of clinical significance to bowel perfusion given widely patent celiac axis and inferior mesenteric artery.  Correlation is suggested with any symptoms suggestive of intestinal angina.  Formal CTA evaluation would better depicted patency of the SMA.   Original Report Authenticated By: Aletta Edouard, M.D.      ASSESSMENT / PLAN:  1.  Crohn's ileocolitis diagnosed 13 years ago. On long term steroids, intolerant to multiple medications. Started Humira earlier this month, has completed 2nd dose of drug. On solumedrol 24m BID in house. CTscan reveals acute inflammation involving the terminal ileum and contiguous  involvement of the ascending colon with findings consistent with active Crohn's disease. Likely a stricture at ICV. Abdominal pain is better and stool frequency slightly better today though he isn't taking much PO.   2.  High risk DVTs. No anticoagulants given blood in stool.  SCDs bother him but agrees to wear them during waking hours.   3.  Abnormal SMA of CTscan which reveals probable moderate narrowing at the origin of the superior  mesenteric artery. Patient for CT angio today.     LOS: 2 days   PTye Savoy 03/27/2012, 8:59 AM  Addendum: Patient seen, examined, and I agree with the above documentation, including the assessment and plan. Agree with above.  Await CTA abd results.  Pt reports recent symptoms are ATYPICAL of his usual Crohn's flares.  He notes pain as "usually not a problem with my Crohn's", but over the past month abd pain, particularly post-prandial is the main  problem.  He is also having food aversion.   If CTA is + then he will need to be seen by vasc surgery. Active Crohn's management with IV solumedrol and newly started Humira Needs to wear his SCDs

## 2012-03-27 NOTE — Consult Note (Signed)
[facility] [place] HISTORY AND PHYSICAL   History of Present Illness:  Patient is a 47 y.o. year old male who presents for evaluation of chronic mesenteric ischemia.  Pt has a history of crohn's disease for several years with multiple flare ups but never in hospital.  He states that this time he had pain post prandial with relief only after having bloody diarrhea 30-60 min post meal.  He has had 30lb wt loss over the last 2-3 months.  He also has history of sclerosing cholangitis.  Other medical problems include prior polysubstance abuse including tobacco alcohol and cocaine.  He has been abstinent x 13 years.  He also has history of elevated cholesterol. Denies history of diabetes or family history of early vascular disease.  Past Medical History  Diagnosis Date  . Crohn's disease of small and large intestines   . Hypercholesterolemia   . History of alcohol abuse   . History of substance abuse   . Depression   . 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 Primary sclerosing cholangitis     ? hepatitis overlap - liver bx x 2 and MRCP  . Allergy     SEASONAL  . Anxiety   . Cataract     BILATERAL/REMOVED  . Hypertension   . Personal history of adenomatous colonic polyps 01/18/2012    12/2010 - 8 mm serrated adenoma of rectum    Past Surgical History  Procedure Date  . Cataract extraction, bilateral   . Pylonidal cyst removal   . Foot surgery     right  . Percutaneous liver biopsy 2007 and 2008  . Colonoscopy 2001, 05/02/2003, 01/28/11    2012: Right colon Crohn's, rectal polyp  . Esophagogastroduodenoscopy 01/28/11    Normal     Social History History  Substance Use Topics  . Smoking status: Former Smoker    Types: Cigarettes  . Smokeless tobacco: Never Used  . Alcohol Use: No  see above substance history  Family History Family History  Problem Relation Age of Onset  . Heart disease Father   . Thyroid cancer Father   . Breast cancer Mother   .  Stomach cancer Mother   . Colon cancer Neg Hx     Allergies  Allergies  Allergen Reactions  . Asacol (Mesalamine)   . Azathioprine   . Mycophenolate Mofetil   . Other     NUTS  . Protein     Agent: Additives and preservatives  . Wheat Bran      Current Facility-Administered Medications  Medication Dose Route Frequency Provider Last Rate Last Dose  . 0.9 % NaCl with KCl 40 mEq / L  infusion   Intravenous Continuous [name], MD 125 mL/hr at 03/27/12 1645    . feeding supplement (VITAL AF 1.2 CAL) liquid 237 mL  237 mL Oral TID AC Darrol Jump, RD   237 mL at 03/27/12 1643  . hydrocortisone (ANUSOL-HC) 2.5 % rectal cream   Topical BID [name], MD   1 application at 08/29/14 [phone]  . LORazepam (ATIVAN) injection 1 mg  1 mg Intravenous Q6H PRN [name], MD      . methylPREDNISolone sodium succinate (SOLU-MEDROL) 40 mg/mL injection 40 mg  40 mg Intravenous Q12H [name], MD   40 mg at 03/27/12 0554  . mirtazapine (REMERON) tablet 15 mg  15 mg Oral QHS [name], MD   15 mg at  03/26/12 2200  . morphine 2 MG/ML injection 2 mg  2 mg Intravenous Q3H PRN [name], MD      . ondansetron [facility]) tablet 4 mg  4 mg Oral Q6H PRN [name], MD       Or  . ondansetron [facility]) injection 4 mg  4 mg Intravenous Q6H PRN [name], MD      . pantoprazole (PROTONIX) injection 40 mg  40 mg Intravenous Q24H [name], MD   40 mg at 03/26/12 2200  . potassium chloride SA (K-DUR,KLOR-CON) CR tablet 40 mEq  40 mEq Oral BID [name], MD   40 mEq at 03/27/12 1643  . thiamine (B-1) injection 100 mg  100 mg Intravenous Daily [name], MD   100 mg at 03/27/12 0947    ROS:   General:  + weight loss,no Fever, chills  HEENT: No recent headaches, no nasal bleeding, no visual changes, no sore throat  Neurologic: No dizziness, blackouts, seizures. No recent symptoms of stroke or mini- stroke. No recent episodes of slurred speech, or temporary  blindness.  Cardiac: No recent episodes of chest pain/pressure, no shortness of breath at rest.  No shortness of breath with exertion.  Denies history of atrial fibrillation or irregular heartbeat  Vascular: No history of rest pain in feet.  No history of claudication.  No history of non-healing ulcer, No history of DVT   Pulmonary: No home oxygen, no productive cough, no hemoptysis,  No asthma or wheezing  Musculoskeletal:  [x ] Arthritis, [ ]  Low back pain,  [ ]  Joint pain  Hematologic:No history of hypercoagulable state.  No history of easy bleeding.  No history of anemia  Gastrointestinal: + hematochezia,  No gastroesophageal reflux, no trouble swallowing  Urinary: [ ]  chronic Kidney disease, [ ]  on HD - [ ]  MWF or [ ]  TTHS, [ ]  Burning with urination, [ ]  Frequent urination, [ ]  Difficulty urinating;   Skin: No rashes  Psychological: + history of anxiety,  + history of depression   Physical Examination  Filed Vitals:   03/26/12 1350 03/26/12 2119 03/27/12 0528 03/27/12 1400  BP: 111/74 113/78 119/78 107/68  Pulse: 78 79 77 93  Temp: 97.2 F (36.2 C) 98.3 F (36.8 C) 98 F (36.7 C) 97.9 F (36.6 C)  TempSrc: Oral Oral Oral Oral  Resp: 16 16 16 16   Height:      Weight:   129 lb 14.4 oz (58.922 kg)   SpO2: 99% 98% 99% 100%    Body mass index is 17.14 kg/(m^2).  General:  Alert and oriented, no acute distress HEENT: Normal Neck: No bruit or JVD Pulmonary: Clear to auscultation bilaterally Cardiac: Regular Rate and Rhythm without murmur Abdomen: thin, Soft, non-tender, non-distended, no mass, no scars Skin: No rash Extremity Pulses:  2+ radial, brachial, femoral, 1+ dorsalis pedis, posterior tibial pulses right side, absent pedal pulses left side Musculoskeletal: No deformity trace pedal edema  Neurologic: Upper and lower extremity motor 5/5 and symmetric  DATA: CTA reviewed, proximal SMA stenosis with large patent vessel distally, no celiac stenosis, ?IMA  stenosis, inflammed terminal ileum, iliac calcification  CBC    Component Value Date/Time   WBC 6.9 03/26/2012 1006   RBC 3.86* 03/26/2012 1006   HGB 12.2* 03/26/2012 1006   HCT 34.2* 03/26/2012 1006   PLT 116* 03/26/2012 1006   MCV 88.6 03/26/2012 1006   MCH 31.6 03/26/2012 1006   MCHC 35.7 03/26/2012 1006  RDW 13.9 03/26/2012 1006   LYMPHSABS 0.3* 03/25/2012 1854   MONOABS 0.3 03/25/2012 1854   EOSABS 0.0 03/25/2012 1854   BASOSABS 0.0 03/25/2012 1854    BMET    Component Value Date/Time   NA 137 03/27/2012 0355   K 3.8 03/27/2012 0355   CL 107 03/27/2012 0355   CO2 26 03/27/2012 0355   GLUCOSE 100* 03/27/2012 0355   BUN 3* 03/27/2012 0355   CREATININE 0.54 03/27/2012 0355   CALCIUM 7.6* 03/27/2012 0355   GFRNONAA >90 03/27/2012 0355   GFRAA >90 03/27/2012 0355    Prealbumin 8.3   ASSESSMENT: Abdominal pain with CT findings of acute Crohn's but also with some SMA stenosis, severe protein calorie malnutrition   PLAN:  Will schedule for mesenteric [facility] at [facility] on Wednesday 830.  Will leave at discretion of primary service whether or not to transfer pt to [facility]. Risks benefits complications and procedure details discussed with pt and he wishes to proceed.  Ruta Hinds, MD [facility] [place] Office: [phone] Pager: [phone]

## 2012-03-27 NOTE — Care Management Note (Signed)
    Page 1 of 1   03/27/2012     11:47:32 AM   CARE MANAGEMENT NOTE 03/27/2012  Patient:  [name], [name]   Account Number:  [id number]  Date Initiated:  03/27/2012  Documentation initiated by:  Sunday Spillers  Subjective/Objective Assessment:   47 yo male admitted with Crohn's flare. PTA lived at home with mother.     Action/Plan:   Home when stable   Anticipated DC Date:  03/31/2012   Anticipated DC Plan:  [place]  CM consult      Choice offered to / List presented to:             Status of service:  Completed, signed off Medicare Important Message given?   (If response is "NO", the following Medicare IM given date fields will be blank) Date Medicare IM given:   Date Additional Medicare IM given:    Discharge Disposition:  HOME/SELF CARE  Per UR Regulation:  Reviewed for med. necessity/level of care/duration of stay  If discussed at [place] of Stay Meetings, dates discussed:    Comments:

## 2012-03-27 NOTE — Progress Notes (Signed)
Nutrition Brief Note  Intervention: - Noted plans for CT angio scan today, awaiting results and further diet advancement - Will continue to monitor   Diet: Clear liquid  - Noted weekend RD yesterday ordered Vital 1.2 TID for pt. Met with pt this morning who said he consumed almost 100% of 1 can yesterday however he said it made him a little queasy. Pt reports he thinks he was sensitive to some of the vitamins in it. Vital 1.2 is a nutrition supplement designed for patients with GI intolerance, unclear what in this supplement pt was sensitive to. Pt reports when he got his vitamin B1 injection this morning he could "feel it in his eyes". Pt reports his food sensitivities have worsened over the past several years. Pt states his main focus is the blood vessel in his stomach and is not interested in talking about nutrition currently.   Mikey College MS, [place], [place] Pager [phone] After Hours Pager

## 2012-03-28 ENCOUNTER — Encounter (HOSPITAL_COMMUNITY): Payer: Self-pay | Admitting: General Practice

## 2012-03-28 ENCOUNTER — Telehealth: Payer: Self-pay | Admitting: Internal Medicine

## 2012-03-28 ENCOUNTER — Ambulatory Visit: Payer: Medicare Other | Admitting: Internal Medicine

## 2012-03-28 DIAGNOSIS — K559 Vascular disorder of intestine, unspecified: Secondary | ICD-10-CM

## 2012-03-28 NOTE — Progress Notes (Signed)
[facility] Progress Note  SUBJECTIVE: Hungry this am. Scared to eat but want to try certain foods. No BMs this am  OBJECTIVE:  Vital signs in last 24 hours: Temp:  [97.8 F (36.6 C)-97.9 F (36.6 C)] 97.8 F (36.6 C) (01/28 0548) Pulse Rate:  [79-93] 85  (01/28 0548) Resp:  [16] 16  (01/28 0548) BP: (107-132)/(68-79) 110/73 mmHg (01/28 0548) SpO2:  [99 %-100 %] 99 % (01/28 0548) Last BM Date: 03/27/12 General:    white male in NAD Heart:  Regular rate and rhythm Abdomen:  Soft, nondistended, nontender, active bowel sounds Neurologic:  Alert and oriented, no gross neurological deficits Psych:  Pleasant   Lab Results:  Basename 03/26/12 1006 03/25/12 1854  WBC 6.9 9.6  HGB 12.2* 14.5  HCT 34.2* 40.3  PLT 116* 141*   BMET  Basename 03/27/12 0355 03/26/12 1006 03/25/12 1854  NA 137 135 131*  K 3.8 3.0* 3.5  CL 107 102 93*  CO2 26 26 25   GLUCOSE 100* 141* 87  BUN 3* 9 19  CREATININE 0.54 0.48* 0.57  CALCIUM 7.6* 7.1* 7.8*   LFT  Basename 03/25/12 1854  PROT 5.1*  ALBUMIN 2.1*  AST 14  ALT 20  ALKPHOS 87  BILITOT 0.8  BILIDIR --  IBILI --   PT/INR  Basename 03/25/12 1854  LABPROT 13.7  INR 1.06    Studies/Results: Ct Abdomen Pelvis W Contrast  03/26/2012  *RADIOLOGY REPORT*  Clinical Data: Flare-up of Crohn's disease with bloody diarrhea, abdominal pain and weight loss.  The patient also has history of primary sclerosing cholangitis.  CT ABDOMEN AND PELVIS WITH CONTRAST  Technique:  Multidetector CT imaging of the abdomen and pelvis was performed following the standard protocol during bolus administration of intravenous contrast.  Contrast: 117m OMNIPAQUE IOHEXOL 300 MG/ML SOLN  Comparison: MRI with MRCP on 02/02/2012.  Findings: There is evidence of inflammation involving the terminal ileum and proximal colon consistent with active Crohn's disease. Distal segment of the terminal ileum shows hyperemia and distention and there likely is a  component of stricture at the ileocecal valve based on CT appearance.  No associated small bowel obstruction is identified.  Involvement of the ascending colon noted with hyperemia, colonic wall thickening and diffuse edema of the colon and immediate surrounding pericolic tissues up to nearly the level of the hepatic flexure.  There is no evidence of bowel perforation or focal abscess.  No fistulas are identified by CT.  The liver shows stable chronic changes related to the primary sclerosing cholangitis with fibrotic changes again noted in the posterior right lobe and stable mild intrahepatic biliary ductal dilatation.  No focal enhancing hepatic masses are identified.  The portal vein is open and shows no evidence of thrombosis.  Some additional small amount of free fluid are noted in the right retroperitoneum and in the lower pelvis.  No enlarged lymph nodes are seen.  Atherosclerotic changes are seen involving the abdominal aorta without evidence of aneurysm.  Although the study was not optimal for arterial evaluation, there is suspected to be potentially a moderate degree of narrowing at the origin of the superior mesenteric artery which may or may not be significant.  The celiac axis and inferior mesenteric artery are widely patent.  The  The bladder is unremarkable.  No hernias are identified.  Bony structures are within normal limits.  IMPRESSION:  1.  Acute inflammation involving the terminal ileum and contiguous involvement of the ascending colon with findings consistent with  active Crohn's disease.  The ascending colon involvement appears more significant than terminal ileum involvement.  There likely is some degree of stricture at the ileocecal valve.  No significant associated bowel obstruction is identified.  There is no evidence of bowel perforation or focal abscess.  Some pockets of free fluid are present in the retroperitoneum and pelvis. 2.  Stable fibrotic changes of the liver related to the known  underlying primary sclerosing cholangitis and also depicted on the recent MRI study. 3.  Probable moderate narrowing at the origin of the superior mesenteric artery.  This may not be of clinical significance to bowel perfusion given widely patent celiac axis and inferior mesenteric artery.  Correlation is suggested with any symptoms suggestive of intestinal angina.  Formal CTA evaluation would better depicted patency of the SMA.   Original Report Authenticated By: Aletta Edouard, M.D.    Ct Angio Abd/pel W/ And/or W/o  03/27/2012  *RADIOLOGY REPORT*  Clinical Data: Evaluate SMA stenosis, history of Crohn disease, bloody diarrhea, upper abdominal pain, weight loss  CT ANGIOGRAPHY ABDOMEN AND PELVIS WITH CONTRAST AND WITHOUT CONTRAST  Comparison: CT of the abdomen pelvis - 03/26/2012; abdominal MRCP - 02/12/2012  Vascular Findings:  Abdominal aorta:  Scattered moderate amount of irregular mixed calcified and noncalcified plaque within a normal caliber abdominal aorta.  No abdominal aortic dissection.  No periaortic stranding.  Celiac artery: Widely patent; note is made of hypertrophy of the gastroduodenal artery with a robust collateral supply to the proximal aspect of the SMA.  SMA:  There is primarily noncalcified plaque involving the origin and proximal aspects of the SMA which results in focal high-grade (greater 70%) stenosis.  As above, there is a robust collateral supply from the gastroduodenal artery (supplied by the widely patent celiac artery) with a collateral supply to the proximal aspect of the SMA.  The main trunk of the SMA is otherwise widely patent.  No evidence of distal embolic disease.  IMA:  There is a promptly noncalcified plaque involving the origin the IMA which likely results in at least 60% luminal narrowing. This finding associated with mild poststenotic dilatation of the proximal aspect of the IMA.  Right renal artery:  Solitary; there is eccentric noncalcified plaque involving the  proximal aspect of right renal artery (coronal image 67, series 605) which does not result in hemodynamically significant narrowing.  Left renal artery:  Duplicated; there is a tiny accessory left sided renal artery which supplies the superior pole left kidney. The dominant left sided renal artery is widely patent without hemodynamically significant narrowing.  Pelvic vasculature:  There is a mixture of calcified and noncalcified irregular plaque involving the bilateral common iliac arteries.  Both common iliac arteries are normal caliber however there is a likely containing penetrating atherosclerotic ulcer within the proximal aspect of the left common iliac artery (axial image 80, series 4, coronal image 61, series 605).  The bilateral external and internal iliac arteries are patent and of normal caliber. The bilateral internal iliac arteries are noted to be heavily diseased.  ------------------------------------------------------------------- -  Nonvascular findings:  Grossly stable architectural distortion and volume loss involving the peripheral aspect of predominantly the posterior segment of the right lobe of the liver compatible with fibrotic change secondary to provided history of primary sclerosing cholangitis as demonstrated on recent MRCP.  There is a perforation or artifact within the lateral segments of the left lobe of the liver.  No discrete hepatic lesions.  Grossly unchanged mild intrahepatic biliary ductal dilatation.  Unchanged appearance of the gallbladder which is again noted to be posterior to the slightly atretic right lobe of the liver.  Hepatic vasculature is patent.  No ascites.  There is symmetric enhancement and excretion of the bilateral kidneys.  There are scattered peripheral sub centimeter wedge- shaped area of hyperenhancement within the bilateral kidneys with index area within the posterior inferior aspect of the left kidney (image 46, series 4) which were not definitely seen on  the prior examinations and are favored to be perfuse normally etiology.  No discrete renal lesions.  No definite renal stones.  No urinary obstruction or perinephric stranding.  Normal appearance of the bilateral adrenal glands, pancreas and spleen.  Incidental note is made of a small splenule.  Grossly unchanged long segment wall thickening primarily involving the cecum and ascending colon to the level of the hepatic flexure. There is minimal apparent wall thickening within the distal aspect of the terminal ileum (image 01/02, series four).  Interval development of a small amount of intraperitoneal fluid.  Minimal amount of stranding is seen within the bilateral pericolic gutters. No organized/drainable fluid collection/abscess.  Normal appearance of the appendix.  No pneumoperitoneum, pneumatosis or portal venous gas.  No retroperitoneal, mesenteric, pelvic or inguinal lymphadenopathy.  Normal appearance of the pelvic organs.  Limited visualization of the lower thorax is negative for focal airspace opacity or pleural effusion.  Normal heart size.  No pericardial effusion.  No acute or aggressive osseous abnormalities.  Normal appearance of the bilateral SI joints.  The bone island incidentally noted within the right femoral head.  Incidental note is made of a left-sided os acetabuli.  IMPRESSION:  1.  Non calcified atherosclerotic plaque involving the origin and proximal aspect of the SMA results in a short segment high-grade stenosis.  This is likely a longstanding phenomenon as there is a robust collateral supply from the widely patent celiac artery via the GDA to supply the proximal aspect of the SMA. 2.  Likely hemodynamically significant narrowing involving the origin of the IMA  3.  Grossly unchanged long segment wall thickening involving primarily the cecum and ascending colon and to a lesser extent, the terminal ileum, findings again compatible providing history of active Crohn's disease. 4.  Interval  development of a small amount of intra-abdominal fluid without definable/organized fluid collection.  5.  Grossly stable fibrotic changes involving the liver compatible with provided history of primary sclerosing cholangitis as depicted on recent MRI.   Original Report Authenticated By: Jake Seats, MD    ASSESSMENT / PLAN:  1. Crohn's ileocolitis. On long term steroids, intolerant to multiple medications. Started Humira earlier this month, has completed 2nd dose of drug. On solumedrol 20m BID in house. CTscan reveals acute inflammation involving the terminal ileum and ascending colon. Likely a stricture at ICV. Continue current managment   2.  Abnormal CTAngio with proximal SMA stenosis with large patent vessel distally, no celiac stenosis. There is possible IMA stenosis as well. Appreciate Vascular's help. [facility] will work on transferring him over.     LOS: 3 days   PTye Savoy 03/28/2012, 10:39 AM

## 2012-03-28 NOTE — Telephone Encounter (Signed)
I spoke with Tye Savoy RNP she will see him this am on rounds and discus his diet with him then.  He is aware

## 2012-03-28 NOTE — Progress Notes (Signed)
Patient seen, examined, and I agree with the above documentation, including the assessment and plan. CT angiography yesterday revealing significant SMA and IMA stenosis with plans for angiography by vascular surgery tomorrow. Their input is greatly appreciated.  We will transfer the patient to [facility] to help logistically with vascular surgery intervention Ileocolonic Crohn's still felt to be partially responsible for the patient's flare, though it is difficult to fully understand the clinical significance of his mesenteric ischemia. I definitely feel that mesenteric ischemia is contributing to his symptoms (weight loss, food aversion, postprandial pain); the extent of which will be easier to determine after intervention Continue IV Solu-Medrol, he is up-to-date on his Humira dosing Will advance diet today, n.p.o. for procedure tomorrow, then advance diet thereafter

## 2012-03-29 ENCOUNTER — Ambulatory Visit (HOSPITAL_COMMUNITY): Admission: RE | Admit: 2012-03-29 | Payer: Medicare Other | Source: Ambulatory Visit | Admitting: Surgery

## 2012-03-29 ENCOUNTER — Encounter (HOSPITAL_COMMUNITY)
Admission: AD | Disposition: A | Discharge status: Home Health | Payer: Self-pay | Source: Ambulatory Visit | Attending: Internal Medicine

## 2012-03-29 HISTORY — PX: ABDOMINAL AORTAGRAM: SHX5454

## 2012-03-29 LAB — CBC
HCT: 32.7 % — ABNORMAL LOW (ref 39.0–52.0)
Hemoglobin: 11.4 g/dL — ABNORMAL LOW (ref 13.0–17.0)
MCH: 31.7 pg (ref 26.0–34.0)
MCHC: 34.9 g/dL (ref 30.0–36.0)
RBC: 3.6 MIL/uL — ABNORMAL LOW (ref 4.22–5.81)

## 2012-03-29 LAB — BASIC METABOLIC PANEL
BUN: 5 mg/dL — ABNORMAL LOW (ref 6–23)
CO2: 31 mEq/L (ref 19–32)
Calcium: 7.7 mg/dL — ABNORMAL LOW (ref 8.4–10.5)
GFR calc non Af Amer: >90 mL/min (ref >90)
Glucose, Bld: 88 mg/dL (ref 70–99)
Potassium: 4 mEq/L (ref 3.5–5.1)
Sodium: 141 mEq/L (ref 135–145)

## 2012-03-29 SURGERY — ABDOMINAL AORTAGRAM
Anesthesia: LOCAL

## 2012-03-29 MED ORDER — ONDANSETRON HCL 4 MG/2ML IJ SOLN: 4.0000 mg | Freq: Four times a day (QID) | INTRAMUSCULAR | Status: DC | PRN

## 2012-03-29 MED ORDER — MIDAZOLAM HCL 2 MG/2ML IJ SOLN
INTRAMUSCULAR | Status: AC
  Filled 2012-03-29: qty 2

## 2012-03-29 MED ORDER — PHENOL 1.4 % MT LIQD
1.0000 | OROMUCOSAL | Status: DC | PRN
  Filled 2012-03-29: qty 177

## 2012-03-29 MED ORDER — METOPROLOL TARTRATE 1 MG/ML IV SOLN
2.0000 mg | INTRAVENOUS | Status: DC | PRN
  Filled 2012-03-29: qty 5

## 2012-03-29 MED ORDER — ALUM & MAG HYDROXIDE-SIMETH 200-200-20 MG/5ML PO SUSP: 15.0000 mL | ORAL | Status: DC | PRN

## 2012-03-29 MED ORDER — ACETAMINOPHEN 325 MG PO TABS: 325.0000 mg | ORAL_TABLET | ORAL | Status: DC | PRN

## 2012-03-29 MED ORDER — SODIUM CHLORIDE 0.9 % IV SOLN
INTRAVENOUS | Status: DC
  Administered 2012-03-29: 12:00:00 via INTRAVENOUS
  Administered 2012-03-31: 500 mL via INTRAVENOUS
  Administered 2012-03-31: 08:00:00 via INTRAVENOUS

## 2012-03-29 MED ORDER — PANTOPRAZOLE SODIUM 40 MG PO TBEC
40.0000 mg | DELAYED_RELEASE_TABLET | Freq: Every day | ORAL | Status: DC
  Administered 2012-03-31 – 2012-04-06 (×7): 40 mg via ORAL
  Filled 2012-03-29 (×8): qty 1

## 2012-03-29 MED ORDER — LIDOCAINE HCL (PF) 1 % IJ SOLN
INTRAMUSCULAR | Status: AC
  Filled 2012-03-29: qty 30

## 2012-03-29 MED ORDER — FENTANYL CITRATE 0.05 MG/ML IJ SOLN
INTRAMUSCULAR | Status: AC
  Filled 2012-03-29: qty 2

## 2012-03-29 MED ORDER — CLONIDINE HCL 0.2 MG PO TABS
0.2000 mg | ORAL_TABLET | ORAL | Status: DC | PRN
  Filled 2012-03-29: qty 1

## 2012-03-29 MED ORDER — ACETAMINOPHEN 650 MG RE SUPP: 325.0000 mg | RECTAL | Status: DC | PRN

## 2012-03-29 NOTE — Progress Notes (Signed)
     [facility] Daily Rounding Note 03/29/2012, 12:46 PM  SUBJECTIVE:       Underwent abdominal arterigram today. Findings: #1 no significant celiac artery stenosis is identified. This provides opacification of the superior mesenteric artery via gastroduodenal collaterals.  #2 high-grade superior mesenteric artery stenosis/occlusion. The superior mesenteric artery fills from celiac collaterals.  #3 high-grade stenosis at the origin of the inferior mesenteric artery. A large arc of Riolan and is identified.  2 soft BMs yesterday.  Appetite still poor.  No abd pain.  Remains on post op bed rest   OBJECTIVE:         Vital signs in last 24 hours:    Temp:  [97.4 F (36.3 C)-98.1 F (36.7 C)] 97.4 F (36.3 C) (01/29 0500) Pulse Rate:  [80-90] 80  (01/29 0837) Resp:  [16-18] 18  (01/29 0500) BP: (113-164)/(75-97) 119/75 mmHg (01/29 0500) SpO2:  [98 %-100 %] 98 % (01/29 0500) Last BM Date: 03/28/12 General: emaciated, pale WM.  comfortable   Heart: RRR Chest: clear B Abdomen: soft, thin, not tender.  BS active  Extremities: no pedal edema Neuro/Psych:  Pleasant, not confused, cooperative  Intake/Output from previous day: 01/28 0701 - 01/29 0700 In: 1740 [P.O.:240; I.V.:1500] Out: 3750 [Urine:3750]  Intake/Output this shift:    Lab Results:  Basename 03/29/12 0430  WBC 12.5*  HGB 11.4*  HCT 32.7*  PLT 104*   BMET  Basename 03/29/12 0430 03/27/12 0355  NA 141 137  K 4.0 3.8  CL 106 107  CO2 31 26  GLUCOSE 88 100*  BUN 5* 3*  CREATININE 0.55 0.54  CALCIUM 7.7* 7.6*    ASSESMENT: *  Crohn's ileocolitis.  Intolerance to multiple colitis meds.  Humira initiated early 03/2012.  On IV solumedrol, Anusol HC .  Bloody colitis is improved.  *  SMA stenosis/occlusion but fills via collateral circulation.     High grade IMA stenosis at origin     No significant celiac artery disease. No intervention required by vascular surgeon *  PSC possible autoimmune hepatitis overlap.   *  ? OCD.  Has obsessive quality to what he will and will not eat and drink. Diet very limited.  *  Thrombocytopenia.  *  Anorexia:  Remeron started 1/25   PLAN: *  When can we restart Lovenox DVT prophylaxis.  *  Po Protonix.  *  Do we need to continue daily Thiamine injections, on since 1/25 *  ? Advance diet? Will do this.  *   ? Start po steroids?   LOS: 4 days   Azucena Freed  03/29/2012, 12:46 PM Pager: [phone]

## 2012-03-29 NOTE — Op Note (Signed)
[facility]  Patient name: [name] MRN: [id number] DOB: 1965/11/08 Sex: male  03/25/2012 - 03/29/2012 Pre-operative Diagnosis: Possible mesenteric ischemia Post-operative diagnosis:  Same Surgeon:  Eldridge Abrahams Procedure Performed:  1.  ultrasound access right femoral artery  2.  abdominal aortogram  3.  first order catheterization (celiac artery  4.  celiac angiogram  5.  first order catheterization (superior mesenteric artery)  6.  superior mesenteric artery angiogram    Indications:  The patient suffers from Crohn's colitis. During his evaluation he was found to have mesenteric stenosis. He comes in today for further evaluation  Procedure:  The patient was identified in the holding area and taken to room 8.  The patient was then placed supine on the table and prepped and draped in the usual sterile fashion.  A time out was called.  Ultrasound was used to evaluate the right common femoral artery.  It was patent .  A digital ultrasound image was acquired.  A micropuncture needle was used to access the right common femoral artery under ultrasound guidance.  An 018 wire was advanced without resistance and a micropuncture sheath was placed.  The 018 wire was removed and a benson wire was placed.  The micropuncture sheath was exchanged for a 5 french sheath.  An omniflush catheter was advanced over the wire to the level of T-12. An abdominal aortogram in the AP and lateral projections were performed. Next the superior mesenteric artery and celiac artery were individually selected and celiac and mesenteric angiograms were performed. Findings:   Aortogram:  The celiac artery appears to be patent proximally as well as throughout it's branches. There is a large gastroduodenal artery which collateralizes to the inferior pancreaticoduodenal artery where the superior mesenteric artery opacifies. The origin of the superior mesenteric artery is not well  visualized, suggesting a high-grade stenosis. There is also a large Arc of Riolan from the inferior mesenteric artery. There is a 80-90% stenosis at the origin of the inferior mesenteric artery.  Celiac angiogram:  The celiac artery and its branches are widely patent. There is collateralize filling to the superior mesenteric artery. No significant stenosis is identified within the celiac artery.  Superior mesenteric artery angiogram:  Superior mesenteric artery was difficult to cannulate. On uncinate tract images there appears to be a high-grade stenosis, possible occlusion at its origin. It fills from celiac artery collaterals.  Inferior mesenteric artery angiogram: Non-selected images were obtained which are approximately 80-90% stenosis at the origin of the inferior mesenteric artery. A large Arc of Riolan is identified.  Intervention:  None  Impression:  #1  no significant celiac artery stenosis is identified. This provides opacification of the superior mesenteric artery via gastroduodenal collaterals.   #2  high-grade superior mesenteric artery stenosis/occlusion. The superior mesenteric artery fills from celiac collaterals.  #3  high-grade stenosis at the origin of the inferior mesenteric artery. A large arc of Riolan and is identified.   Theotis Burrow, M.D. [facility] Office: [phone] Pager:  [phone]

## 2012-03-29 NOTE — Progress Notes (Signed)
Angiogram reviewed.  2 vessel mesenteric disease.  May eventually need revascularization but would prefer for this not to be in the setting of acute Crohn's flare up.  His symptoms have improved a little.  Will continue to follow but as long as he is stable or improving will defer mesenteric bypass for now and reassess when Crohn's has cooled down.  Ruta Hinds, MD [facility] Office: [phone] Pager: [phone]

## 2012-03-30 ENCOUNTER — Telehealth: Payer: Self-pay | Admitting: Internal Medicine

## 2012-03-30 MED ORDER — METHYLPREDNISOLONE SODIUM SUCC 40 MG IJ SOLR: 40.0000 mg | Freq: Every day | INTRAMUSCULAR | Status: DC

## 2012-03-30 MED ORDER — LORAZEPAM 2 MG/ML IJ SOLN
1.0000 mg | Freq: Four times a day (QID) | INTRAMUSCULAR | Status: DC
  Administered 2012-03-30: 1 mg via INTRAVENOUS
  Filled 2012-03-30: qty 1

## 2012-03-30 MED ORDER — PREDNISONE 20 MG PO TABS
30.0000 mg | ORAL_TABLET | Freq: Every day | ORAL | Status: DC
  Administered 2012-04-02: 30 mg via ORAL
  Filled 2012-03-30 (×2): qty 1

## 2012-03-30 MED ORDER — POLYETHYLENE GLYCOL 3350 17 GM/SCOOP PO POWD
1.0000 | Freq: Once | ORAL | Status: AC
  Administered 2012-03-30: 1 via ORAL
  Filled 2012-03-30: qty 255

## 2012-03-30 NOTE — Telephone Encounter (Signed)
Will you please call [name] when you get a chance

## 2012-03-30 NOTE — Progress Notes (Addendum)
NUTRITION FOLLOW UP/CONSULT  Intervention:   1. D/C Vital 1.2 as pt states he refuses to drink it due to extreme stomach upset  2. Recommend initiation of nutrition support in setting of ongoing poor oral intake. Recommend initiation of enteral nutrition. Once ready to initiate enteral nutrition, recommend Vital 1.2 at 10 mL/hr via enteral feeding tube. Pt will require slow advancement as pt is at high risk for refeeding syndrome given ongoing malnutrition. RD to provide further advancement recommendations after TF has been initiated. Recommend monitoring K, Mag, and Phos labs for at least 3 days during  initiation and throughout advancement of TF. (Paged [name] and discussed recommendations.)  3 RD will continue to follow  Nutrition Dx:   Malnutrition related to poor PO intake as evidenced by </75% meal completion for >/=1 month and severe fat and muscle wasting. Ongoing.  Goal:   Meet >/=90% estimated nutrition needs. Unmet.  Monitor:   Initiation of TF, PO's, I/O's, weight trends, labs  Assessment:   Pt with a hx of Crohn's Pt states multiple drug and food sensitivities and intolerances.   Transferred to [facility] on 1/29 for mesenteric angio. Angiogram performed 1/29, findings revealed 2 vessel mesenteric disease. Per MD, pt may eventually need revascularization but would prefer for this not to be in the setting of acute Crohn's flare up. Per GI note today, will attempt colonoscopy to assess R colon and ileum soon.  RD consulted for recommendations for enteral vs parenteral nutrition support per GI MD.  Spoke with pt while parents were at bedside. Pt states that Vital 1.2 makes him nauseous and he will not drink it. Per pt request, will d/c oral Vital 1.2. Pt states that 2/2 extreme nausea he has not had an appetite. Highly recommend TF due to poor PO's and declining nutrition and weight status. TF preferential over TPN at this time as gut appears to be functioning at this time. Paged  [name] to discuss need for enteral nutrition, per MD, pt not ready to start nutrition support at this time, will leave recommendations per his request.  Height: Ht Readings from Last 1 Encounters:  03/25/12 6' 1"  (1.854 m)    Weight Status:   Wt Readings from Last 1 Encounters:  03/30/12 125 lb (56.7 kg)  wt down 5 lb x 5 days  Re-estimated needs:  Kcal: 1800-2000 Protein: 90-110 grams Fluid: 1.8-2.0 L/day  Skin: intact, no wounds noted  Diet Order: Clear Liquid   Intake/Output Summary (Last 24 hours) at 03/30/12 0931 Last data filed at 03/30/12 0600  Gross per 24 hour  Intake    520 ml  Output   1175 ml  Net   -655 ml    Last BM: 1/30   Labs:   Lab 03/29/12 0430 03/27/12 0355 03/26/12 1006 03/25/12 1854  NA 141 137 135 --  K 4.0 3.8 3.0* --  CL 106 107 102 --  CO2 31 26 26  --  BUN 5* 3* 9 --  CREATININE 0.55 0.54 0.48* --  CALCIUM 7.7* 7.6* 7.1* --  MG -- -- -- 1.9  PHOS -- -- -- 2.4  GLUCOSE 88 100* 141* --    CBG (last 3)  No results found for this basename: GLUCAP:3 in the last 72 hours  Scheduled Meds:   . feeding supplement (VITAL AF 1.2 CAL)  237 mL Oral TID AC  . hydrocortisone   Topical BID  . methylPREDNISolone (SOLU-MEDROL) injection  40 mg Intravenous Daily  . mirtazapine  15 mg Oral QHS  . pantoprazole  40 mg Oral Q0600    Continuous Infusions:   . sodium chloride 75 mL/hr at 03/29/12 1216  . 0.9 % NaCl with KCl 40 mEq / L 125 mL/hr (03/29/12 0100)    [name] Dietetic Intern # [phone]  I agree with the above information and made appropriate revisions in blue. [name] Coke MS, RD, LDN Pager: [phone] After-hours pager: [phone]

## 2012-03-30 NOTE — Progress Notes (Signed)
[facility] - Primary GI MD   I have spoken to Dr. Oneida Alar and [name] - this is complicated. Spoke to Dr. Hilarie Fredrickson last night.  Dr. Oneida Alar, Pyrtle and I agree that revascularization is appropriate, so does [name].  Issues of malnutrition and steroids are real  Recommendations  1) Reduce and taper steroids 2) Colonoscopy to assess right colon and ileum (if possible) 3) Nutrition - enteral vs. TPN. Need to decide.Would like to avoid TPN if possible but may need to go that route 4) Nutrition consult ordered 5) DC Thiamine 6) Reduce solumedrol to daily starting today - further taper to follow per Dr. Hilarie Fredrickson 7) Change diet to full liquids  [name] is aware.  [name], MD, [facility] [phone] (pager) 03/30/2012 9:23 AM

## 2012-03-30 NOTE — Progress Notes (Signed)
Patient seen, examined, and I agree with the above documentation, including the assessment and plan.

## 2012-03-30 NOTE — Progress Notes (Addendum)
[facility]  Discussed situation with [name] and parents. He is clearly depressed and expressing wishes to go to hospice though he realizes this is not rational - he just feels like he cannot "go on" Is not suicidal. Tearful at times. Wants to be able to go home soon - I have brokered a deal for him to get a nasoenteric tube tomorrow and plan to dc Sat AM - doubt we could get everything together in place by Fri PM. I want him to get home health if possible and will arrange for trial of enteral nutrition with close follow-up by me. Will need refeeding syndrome lab monitoring also. He currently has an appointment w/ me for 2/7 and I can do phone f/u before then +/- house call  If that does not work will need TPN.  Colonoscopy on for tomorrow. Starting running lorazepam now - and will need to see about an anxiolytic at home. Perhaps po lorazepam though I think po clonazepam better choice. Psychiatry input could be good but again ? If time to do that before he goes home - let's try to see if they will see him later on Friday (? Lingering sedation problems) or possibly Sat AM.  I will see him again tomorrow also.  [name], MD, [facility] [phone] (pager) 03/30/2012 6:19 PM

## 2012-03-30 NOTE — Progress Notes (Signed)
[facility] Progress Note  Subjective: Very tearful today and upset about the road he may face in getting well. He is agreeable to colonoscopy, but also would like to discuss hospice care so that he can go home and be with his family. Both he and his mother and father have questions and I have spoken to [name] who will meet with the family this afternoon Able tolerate a little bit of yogurt this morning without nausea and vomiting  Objective:  Vital signs in last 24 hours: Temp:  [97.5 F (36.4 C)-97.9 F (36.6 C)] 97.9 F (36.6 C) (01/30 1405) Pulse Rate:  [1-92] 1  (01/30 1405) Resp:  [18-20] 20  (01/30 1405) BP: (111-137)/(72-82) 137/82 mmHg (01/30 1405) SpO2:  [99 %-100 %] 99 % (01/30 1405) Weight:  [125 lb (56.7 kg)] 125 lb (56.7 kg) (01/30 0553) Last BM Date: 03/30/12 Gen: awake, alert, NAD, tearful at times HEENT: anicteric, op clear CV: RRR, no mrg Pulm: CTA b/l Abd: Scaphoid, mild tenderness no rebound or guarding, positive bowel Ext: no c/c/e Neuro: nonfocal   Intake/Output from previous day: 01/29 0701 - 01/30 0700 In: 520 [P.O.:360; I.V.:160] Out: 1175 [Urine:1175] Intake/Output this shift: Total I/O In: 960 [P.O.:960] Out: 1400 [Urine:1400]  Lab Results:  Basename 03/29/12 0430  WBC 12.5*  HGB 11.4*  HCT 32.7*  PLT 104*   BMET  Basename 03/29/12 0430  NA 141  K 4.0  CL 106  CO2 31  GLUCOSE 88  BUN 5*  CREATININE 0.55  CALCIUM 7.7*    Assessment / Plan: 47 year old with long-standing ileocolonic Crohn's, 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  Ileocolitis -- this is a difficult situation because it is tough to completely tease out if the active inflammation and pain is related to solely ischemic ileocolitis or IBD, or some combination of both.  He certainly should have responded to steroids, and he has been on high-dose steroids for the better part of 8  weeks. He also was recently started on adalimumab without significant response. --I appreciate Dr. Celesta Aver involvement , and the plan had been for colonoscopy tomorrow to examine the colon and hopefully ileum, and to obtain biopsies to help determine whether this looks more like ischemic injury or inflammatory bowel disease --It is likely time to wean steroids --We need to discuss nutrition whether this be nasoenteric or even PEG versus TPN --Right now the patient is overwhelmed with his disease and wants to consider hospice.  I have stated that hospice seems very extreme to me, and I do feel there is hope for meaningful recovery/improvement, especially in light of the newly discovered mesenteric insufficiency. --[name] will come by this evening to talk with the patient and his family further in this regard   Active Problems:  Dehydration  Malnutrition  Hematochezia     LOS: 5 days   Allysson Rinehimer M  03/30/2012, 5:06 PM

## 2012-03-30 NOTE — Progress Notes (Signed)
Pt states he feels better but in same breath says he wants an operation.  No real abdominal pain at this point but says everything he eats immediately causes diarrhea  Filed Vitals:   03/29/12 1404 03/29/12 1735 03/29/12 2221 03/30/12 0553  BP: 134/84 111/82 117/72 121/73  Pulse: 87 92 83 83  Temp: 97.7 F (36.5 C) 97.5 F (36.4 C) 97.8 F (36.6 C) 97.8 F (36.6 C)  TempSrc: Oral Oral Oral Oral  Resp: 20 18 18 18   Height:      Weight:    125 lb (56.7 kg)  SpO2: 100% 100% 99% 99%   Abdomen: soft non tender non distended Extremities: no groin hematoma  CBC    Component Value Date/Time   WBC 12.5* 03/29/2012 0430   RBC 3.60* 03/29/2012 0430   HGB 11.4* 03/29/2012 0430   HCT 32.7* 03/29/2012 0430   PLT 104* 03/29/2012 0430   MCV 90.8 03/29/2012 0430   MCH 31.7 03/29/2012 0430   MCHC 34.9 03/29/2012 0430   RDW 14.6 03/29/2012 0430   LYMPHSABS 0.3* 03/25/2012 1854   MONOABS 0.3 03/25/2012 1854   EOSABS 0.0 03/25/2012 1854   BASOSABS 0.0 03/25/2012 1854    BMET    Component Value Date/Time   NA 141 03/29/2012 0430   K 4.0 03/29/2012 0430   CL 106 03/29/2012 0430   CO2 31 03/29/2012 0430   GLUCOSE 88 03/29/2012 0430   BUN 5* 03/29/2012 0430   CREATININE 0.55 03/29/2012 0430   CALCIUM 7.7* 03/29/2012 0430   GFRNONAA >90 03/29/2012 0430   GFRAA >90 03/29/2012 0430    Assessment/Plan: Difficult situation.  He has evidence of acute Crohn's flare up but also evidence of mesenteric occlusive disease.  His anatomy and young age are unfavorable for mesenteric stenting; however, he is malnourished, very deconditioned has acute gut inflammation and is on significant dose of steroids making possible operation fraught with complications including graft infection and even death.  Best scenario is to cool down his Crohn's disease and then re-evaluate.  If he is unable to tolerate PO nutrition need to consider TPN to supplement him for a while to at least improve his severe protein calorie malnutrition.  Will  try to contact GI service today to discuss plan  Ruta Hinds, MD [facility] Office: [phone] Pager: [phone]

## 2012-03-31 ENCOUNTER — Other Ambulatory Visit (HOSPITAL_COMMUNITY): Payer: Medicare Other

## 2012-03-31 ENCOUNTER — Inpatient Hospital Stay (HOSPITAL_COMMUNITY): Payer: Medicare Other

## 2012-03-31 ENCOUNTER — Encounter (HOSPITAL_COMMUNITY): Payer: Self-pay

## 2012-03-31 ENCOUNTER — Encounter (HOSPITAL_COMMUNITY)
Admission: AD | Disposition: A | Discharge status: Home Health | Payer: Self-pay | Source: Ambulatory Visit | Attending: Internal Medicine

## 2012-03-31 DIAGNOSIS — D126 Benign neoplasm of colon, unspecified: Secondary | ICD-10-CM

## 2012-03-31 DIAGNOSIS — K5090 Crohn's disease, unspecified, without complications: Secondary | ICD-10-CM

## 2012-03-31 HISTORY — PX: COLONOSCOPY: SHX5424

## 2012-03-31 SURGERY — COLONOSCOPY
Anesthesia: Moderate Sedation

## 2012-03-31 MED ORDER — VITAL AF 1.2 CAL PO LIQD
1000.0000 mL | ORAL | Status: DC
  Filled 2012-03-31: qty 1000

## 2012-03-31 MED ORDER — VITAL AF 1.2 CAL PO LIQD
1000.0000 mL | ORAL | Status: DC
  Administered 2012-03-31 – 2012-04-04 (×4): 1000 mL
  Filled 2012-03-31 (×3): qty 1000
  Filled 2012-03-31: qty 237
  Filled 2012-03-31 (×3): qty 1000

## 2012-03-31 MED ORDER — MIDAZOLAM HCL 5 MG/5ML IJ SOLN
INTRAMUSCULAR | Status: DC | PRN
  Administered 2012-03-31: 1 mg via INTRAVENOUS
  Administered 2012-03-31: 2 mg via INTRAVENOUS
  Administered 2012-03-31: 1 mg via INTRAVENOUS
  Administered 2012-03-31: 2 mg via INTRAVENOUS
  Administered 2012-03-31: 1 mg via INTRAVENOUS

## 2012-03-31 MED ORDER — LORAZEPAM 0.5 MG PO TABS
0.5000 mg | ORAL_TABLET | Freq: Three times a day (TID) | ORAL | Status: DC | PRN
  Administered 2012-03-31 – 2012-04-06 (×6): 0.5 mg via ORAL
  Filled 2012-03-31 (×5): qty 1

## 2012-03-31 MED ORDER — FENTANYL CITRATE 0.05 MG/ML IJ SOLN
INTRAMUSCULAR | Status: DC | PRN
  Administered 2012-03-31 (×4): 25 ug via INTRAVENOUS

## 2012-03-31 MED ORDER — FENTANYL CITRATE 0.05 MG/ML IJ SOLN
INTRAMUSCULAR | Status: AC
  Filled 2012-03-31: qty 2

## 2012-03-31 MED ORDER — VITAL AF 1.2 CAL PO LIQD
1000.0000 mL | ORAL | Status: DC
  Filled 2012-03-31: qty 1000
  Filled 2012-03-31: qty 237
  Filled 2012-03-31: qty 1000

## 2012-03-31 MED ORDER — MIDAZOLAM HCL 5 MG/ML IJ SOLN
INTRAMUSCULAR | Status: AC
  Filled 2012-03-31: qty 2

## 2012-03-31 NOTE — Progress Notes (Signed)
NUTRITION FOLLOW UP  Intervention:   TF to begin via NG feeding tube with Vital AF 1.2 at 10 ml/hr.   Will increase as tolerated, 10 ml every 12 hours to goal of 65 ml per hour.    If patient does not tolerate enteral nutrition, may need TPN or a combination of TPN and trickle feeds.  Monitor magnesium, potassium, and phosphorus daily for at least 3 days, MD to replete as needed, as pt is at risk for refeeding syndrome given severe malnutrition and minimal intake for the past month.  Weekend RD informed of patient.  Please page her if needed over the weekend.   [phone].  Nutrition Dx:   Malnutrition related to poor PO intake as evidenced by < 75% meal completion for > 1 month and severe fat and muscle wasting-ongoing.  Goal:   Meet >/= 90% of estimated nutrition needs.  Monitor:   Initiation of TF, PO,'s I/O's, weight trends, labs  Assessment:   Patient known to me.  Patient with hx of chron's with multiple drug and food sensitivities and intolerance.    Diet has been advanced to regular allowing patient to have anything he wants and thinks that he can tolerate.  Patient not taking anything now.  Patient with a 10 lb weight loss in the last week.  Spoke with patient and discussed starting of TF.  Patient states he does not think he will tolerate it because he did not tolerate it orally.  Stated that the amount will be much less.  Discussed with RN.  MD paged RD for TF goals and advancement.  Patient very high risk for refeeding syndrome.   Results of colonoscopy noted with loss of almost all villous appearance in the terminal ileum.  Biopsies pending.   COLON FINDINGS: Severe ileitis, characterized by superficial  ulceration, exudates, and loss of almost all villous appearance was  found in the terminal ileum. Multiple biopsies of the area were  performed using cold forceps. The ileocecal valve was somewhat  patulous, and not strictured. Colitis was found throughout the  entire  examined colon, most severe in the cecum, ascending colon,  and transverse colon, but definitely involving the sigmoid and  rectum. The rectal inflammation was patchy and very mild. The  mucosa was edematous, erythematous, and had granularity, loss of  vascularity and superficial and deep ulcers. There were areas of  white exudates. Multiple biopsies were performed using cold  forceps throughout the colon and placed in separate jars. A  possible flat polyp, arising in an area of colitis, measuring 6-7  mm in size was found in the ascending colon. Multiple biopsies of  the lesion were performed using cold forceps and placed in a  separate jar. The ascending colon and hepatic flexure areas were  somewhat narrowed, but the pediatric colonoscope traversed this  area without difficulty. A sessile polyp measuring 5 mm in size  was found in the rectosigmoid colon. A polypectomy was performed  with a cold snare. The resection was complete and the polyp tissue  was completely retrieved. Retroflexed views revealed no  abnormalities. The scope was withdrawn and the procedure  completed.     Height: Ht Readings from Last 1 Encounters:  03/25/12 6' 1"  (1.854 m)    Weight Status:   Wt Readings from Last 1 Encounters:  03/31/12 122 lb 3.2 oz (55.43 kg)    Re-estimated needs:  Kcal: 1800-2000 Protein: 90-110 gm Fluid: 1.8-2L  Skin: intact, no wounds noted  Diet  Order: General   Intake/Output Summary (Last 24 hours) at 03/31/12 1632 Last data filed at 03/31/12 0500  Gross per 24 hour  Intake    360 ml  Output   1350 ml  Net   -990 ml    Last BM: 1/31   Labs:   Lab 03/29/12 0430 03/27/12 0355 03/26/12 1006 03/25/12 1854  NA 141 137 135 --  K 4.0 3.8 3.0* --  CL 106 107 102 --  CO2 31 26 26  --  BUN 5* 3* 9 --  CREATININE 0.55 0.54 0.48* --  CALCIUM 7.7* 7.6* 7.1* --  MG -- -- -- 1.9  PHOS -- -- -- 2.4  GLUCOSE 88 100* 141* --    CBG (last 3)  No results found for  this basename: GLUCAP:3 in the last 72 hours  Scheduled Meds:   . feeding supplement (VITAL AF 1.2 CAL)  1,000 mL Per Tube Q24H  . hydrocortisone   Topical BID  . mirtazapine  15 mg Oral QHS  . pantoprazole  40 mg Oral Q0600  . predniSONE  30 mg Oral QAC breakfast    Continuous Infusions:   . sodium chloride 500 mL (03/31/12 0942)    Antonieta Iba, RD, LDN Clinical Inpatient Dietitian Pager:  [phone] Weekend and after hours pager:  [phone]

## 2012-03-31 NOTE — Progress Notes (Addendum)
Patient will discharge home with his parents.  Their address is:  [address], [place].  Best phone number to call is [phone].  Spoke with pt about agency choice for his HHRN/Aide.  He said he still felt "loopy" from his earlier procedure and asked me to speak with his parents about the agency choice.  The parents chose [facility] for services.  Contacted them re: referral.  Spoke with MD who said he would place the order for the tube feeding so that [facility] could proceed with having it available for patient at time of discharge.

## 2012-03-31 NOTE — Interval H&P Note (Signed)
History and Physical Interval Note: No interval change.  Favorable response to lorazepam. Proceed to colonoscopy.  03/31/2012 9:54 AM  [name]  has presented today for surgery, with the diagnosis of hx colitis  The various methods of treatment have been discussed with the patient and family. After consideration of risks, benefits and other options for treatment, the patient has consented to  Procedure(s) (LRB) with comments: COLONOSCOPY (N/A) as a surgical intervention .  The patient's history has been reviewed, patient examined, no change in status, stable for surgery.  I have reviewed the patient's chart and labs.  Questions were answered to the patient's satisfaction.     Toniette Devera M

## 2012-03-31 NOTE — OR Nursing (Signed)
Pt taken to x-ray for NG placement.

## 2012-03-31 NOTE — Progress Notes (Signed)
Less pain today Colonoscopy findings noted Biopsies pending Will see pt again on Monday Dr Early on call this weekend if questions  Ruta Hinds, MD [facility] Office: [phone] Pager: [phone]

## 2012-03-31 NOTE — Op Note (Addendum)
[facility] [place], [address]   COLONOSCOPY PROCEDURE REPORT  PATIENT: [name], [name]  MR#: [id number] BIRTHDATE: 1965-03-21 , 46  yrs. old GENDER: Male ENDOSCOPIST: Jerene Bears, MD PROCEDURE DATE:  03/31/2012 PROCEDURE:   Colonoscopy with biopsy, Colonoscopy with snare polypectomy, and Colonoscopy with cold biopsy polypectomy ASA CLASS:   Class III INDICATIONS:an abnormal CT, follow up for previously diagnosed Crohn's disease, and Abdominal pain. MEDICATIONS: These medications were titrated to patient response per physician's verbal order, Fentanyl 100 mcg IV, and Versed 6 mg IV  DESCRIPTION OF PROCEDURE:   After the risks benefits and alternatives of the procedure were thoroughly explained, informed consent was obtained.  A digital rectal exam revealed no rectal mass.   The Pentax pediatric video colonoscope was introduced through the anus and advanced to the terminal ileum which was intubated for a short distance. No adverse events experienced. The quality of the prep was Miralax fair  The instrument was then slowly withdrawn as the colon was fully examined.    COLON FINDINGS: Severe ileitis, characterized by superficial ulceration, exudates, and loss of almost all villous appearance was found in the terminal ileum.  Multiple biopsies of the area were performed using cold forceps.  The ileocecal valve was somewhat patulous, and not strictured. Colitis was found throughout the entire examined colon, most severe in the cecum, ascending colon, and transverse colon, but definitely involving the sigmoid and rectum.  The rectal inflammation was patchy and very mild.  The mucosa was edematous, erythematous, and had granularity, loss of vascularity and superficial and deep ulcers.  There were areas of white exudates.  Multiple biopsies were performed using cold forceps throughout the colon and placed in separate jars.   A possible  flat polyp, arising in an area of colitis, measuring 6-7 mm in size was found in the ascending colon.  Multiple biopsies of the lesion were performed using cold forceps and placed in a separate jar.  The ascending colon and hepatic flexure areas were somewhat narrowed, but the pediatric colonoscope traversed this area without difficulty.  A sessile polyp measuring 5 mm in size was found in the rectosigmoid colon.  A polypectomy was performed with a cold snare.  The resection was complete and the polyp tissue was completely retrieved.  Retroflexed views revealed no abnormalities.       The scope was withdrawn and the procedure completed.  COMPLICATIONS: There were no complications.  ENDOSCOPIC IMPRESSION: 1.   Severe ileitis was found in the terminal ileum; multiple biopsies of the area were performed using cold forceps 2.   Colitis was found throughout the entire examined colon; most significant cecum to transverse colon; multiple biopsies were performed using cold forceps 3.   Possible polyp measuring 6-7 mm in size was found in the ascending colon; multiple biopsies of the lesion were performed 4.   Sessile polyp measuring 5 mm in size was found in the rectosigmoid colon; polypectomy was performed with a cold snare  RECOMMENDATIONS: 1.  Await biopsy results 2.  Continue current medications 3.  Obtain stool sample to exclude concomitant Clostridium difficile 4.  Further recommendations based on pathology results   eSigned:  Jerene Bears, MD 03/31/2012 11:11 AM   cc: Silvano Rusk, MD and The Patient   PATIENT NAME:  [name], [name] MR#: [id number]

## 2012-03-31 NOTE — Progress Notes (Signed)
[facility]  Patient Status: New  [facility] is providing the following services: RN, HHA and Enteral (case conference done with patient, formula and supplies will be delivered to the home)   Will need enteral orders faxed to [facility] if this pt goes home over the weekend. Thanks!  If patient discharges after hours, please call [phone].   Pearletha Forge 03/31/2012, 3:22 PM

## 2012-03-31 NOTE — H&P (View-Only) (Signed)
[facility] Progress Note  Subjective: Very tearful today and upset about the road he may face in getting well. He is agreeable to colonoscopy, but also would like to discuss hospice care so that he can go home and be with his family. Both he and his mother and father have questions and I have spoken to [name] who will meet with the family this afternoon Able tolerate a little bit of yogurt this morning without nausea and vomiting  Objective:  Vital signs in last 24 hours: Temp:  [97.5 F (36.4 C)-97.9 F (36.6 C)] 97.9 F (36.6 C) (01/30 1405) Pulse Rate:  [1-92] 1  (01/30 1405) Resp:  [18-20] 20  (01/30 1405) BP: (111-137)/(72-82) 137/82 mmHg (01/30 1405) SpO2:  [99 %-100 %] 99 % (01/30 1405) Weight:  [125 lb (56.7 kg)] 125 lb (56.7 kg) (01/30 0553) Last BM Date: 03/30/12 Gen: awake, alert, NAD, tearful at times HEENT: anicteric, op clear CV: RRR, no mrg Pulm: CTA b/l Abd: Scaphoid, mild tenderness no rebound or guarding, positive bowel Ext: no c/c/e Neuro: nonfocal   Intake/Output from previous day: 01/29 0701 - 01/30 0700 In: 520 [P.O.:360; I.V.:160] Out: 1175 [Urine:1175] Intake/Output this shift: Total I/O In: 960 [P.O.:960] Out: 1400 [Urine:1400]  Lab Results:  Basename 03/29/12 0430  WBC 12.5*  HGB 11.4*  HCT 32.7*  PLT 104*   BMET  Basename 03/29/12 0430  NA 141  K 4.0  CL 106  CO2 31  GLUCOSE 88  BUN 5*  CREATININE 0.55  CALCIUM 7.7*    Assessment / Plan: 47 year old with long-standing ileocolonic Crohn's, 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  Ileocolitis -- this is a difficult situation because it is tough to completely tease out if the active inflammation and pain is related to solely ischemic ileocolitis or IBD, or some combination of both.  He certainly should have responded to steroids, and he has been on high-dose steroids for the better part of 8  weeks. He also was recently started on adalimumab without significant response. --I appreciate Dr. Celesta Aver involvement , and the plan had been for colonoscopy tomorrow to examine the colon and hopefully ileum, and to obtain biopsies to help determine whether this looks more like ischemic injury or inflammatory bowel disease --It is likely time to wean steroids --We need to discuss nutrition whether this be nasoenteric or even PEG versus TPN --Right now the patient is overwhelmed with his disease and wants to consider hospice.  I have stated that hospice seems very extreme to me, and I do feel there is hope for meaningful recovery/improvement, especially in light of the newly discovered mesenteric insufficiency. --[name] will come by this evening to talk with the patient and his family further in this regard   Active Problems:  Dehydration  Malnutrition  Hematochezia     LOS: 5 days   Evolett Somarriba M  03/30/2012, 5:06 PM

## 2012-03-31 NOTE — Progress Notes (Signed)
I have initiated Vital 1.2 nutrition via tube. Discussed w/ care manager - at this point will try continuous w/ hopes of observing him next few days here but if he goes home before then will follow-up via phone and home health re: tolerance to the enteral nutrition.  Am available by pager or cell this weekend.  [name], MD, [facility] [phone] (pager) 03/31/2012 1:55 PM

## 2012-03-31 NOTE — Progress Notes (Signed)
Received orders for [facility] for patient and tube feeding pump and supplies.  Pt is off the floor having test done. Will get his [facility] agency choice when he returns and get everything arranged.

## 2012-03-31 NOTE — Progress Notes (Signed)
[facility] MD  Lorazepam seems to have done the trick. "I am much better after the Ativan".  Maybe this will buy [place] time to better coordinate his nutrition (test enteral in house) and get psych input. Let's try - he did not disagree with possibly staying longer when discussed this AM.  Thanks  [name], MD, [facility] [phone] (pager) 03/31/2012 7:14 AM

## 2012-04-01 DIAGNOSIS — IMO0002 Reserved for concepts with insufficient information to code with codable children: Secondary | ICD-10-CM

## 2012-04-01 DIAGNOSIS — F411 Generalized anxiety disorder: Secondary | ICD-10-CM

## 2012-04-01 LAB — BASIC METABOLIC PANEL
CO2: 32 mEq/L (ref 19–32)
Chloride: 95 mEq/L — ABNORMAL LOW (ref 96–112)
Chloride: 96 mEq/L (ref 96–112)
GFR calc Af Amer: >90 mL/min (ref >90)
GFR calc Af Amer: >90 mL/min (ref >90)
Potassium: 2.5 mEq/L — CL (ref 3.5–5.1)
Potassium: 2.9 mEq/L — ABNORMAL LOW (ref 3.5–5.1)
Sodium: 133 mEq/L — ABNORMAL LOW (ref 135–145)

## 2012-04-01 LAB — PHOSPHORUS: Phosphorus: 2.5 mg/dL (ref 2.3–4.6)

## 2012-04-01 LAB — GLUCOSE, CAPILLARY: Glucose-Capillary: 241 mg/dL — ABNORMAL HIGH (ref 70–99)

## 2012-04-01 LAB — MAGNESIUM: Magnesium: 1.5 mg/dL (ref 1.5–2.5)

## 2012-04-01 MED ORDER — POTASSIUM CHLORIDE 10 MEQ/100ML IV SOLN
10.0000 meq | Freq: Once | INTRAVENOUS | Status: AC
  Administered 2012-04-01: 10 meq via INTRAVENOUS

## 2012-04-01 MED ORDER — POTASSIUM CHLORIDE 10 MEQ/100ML IV SOLN
10.0000 meq | Freq: Once | INTRAVENOUS | Status: AC
Start: 2012-04-01 — End: 2012-04-02
  Administered 2012-04-02: 10 meq via INTRAVENOUS
  Filled 2012-04-01: qty 100

## 2012-04-01 MED ORDER — DEXTROSE 50 % IV SOLN
INTRAVENOUS | Status: AC
  Administered 2012-04-01: 50 mL via INTRAVENOUS
  Filled 2012-04-01: qty 50

## 2012-04-01 MED ORDER — DEXTROSE 50 % IV SOLN
1.0000 | Freq: Once | INTRAVENOUS | Status: AC
  Administered 2012-04-01: 50 mL via INTRAVENOUS

## 2012-04-01 MED ORDER — POTASSIUM CHLORIDE 10 MEQ/100ML IV SOLN
10.0000 meq | INTRAVENOUS | Status: DC
  Administered 2012-04-01 (×2): 10 meq via INTRAVENOUS
  Filled 2012-04-01 (×2): qty 100

## 2012-04-01 MED ORDER — POTASSIUM CHLORIDE 10 MEQ/100ML IV SOLN
10.0000 meq | INTRAVENOUS | Status: AC
  Administered 2012-04-01 (×4): 10 meq via INTRAVENOUS
  Filled 2012-04-01: qty 400

## 2012-04-01 MED ORDER — POTASSIUM CHLORIDE 10 MEQ/100ML IV SOLN
INTRAVENOUS | Status: AC
  Administered 2012-04-01: 10 meq
  Filled 2012-04-01: qty 100

## 2012-04-01 MED ORDER — MAGNESIUM SULFATE 40 MG/ML IJ SOLN
2.0000 g | Freq: Once | INTRAMUSCULAR | Status: AC
  Administered 2012-04-01: 2 g via INTRAVENOUS
  Filled 2012-04-01: qty 50

## 2012-04-01 MED ORDER — KCL IN DEXTROSE-NACL 10-5-0.45 MEQ/L-%-% IV SOLN
INTRAVENOUS | Status: DC
  Administered 2012-04-01 – 2012-04-02 (×2): via INTRAVENOUS
  Filled 2012-04-01 (×8): qty 1000

## 2012-04-01 MED ORDER — LORAZEPAM 2 MG/ML IJ SOLN
0.7500 mg | Freq: Every day | INTRAMUSCULAR | Status: DC
  Administered 2012-04-01 – 2012-04-05 (×5): 0.75 mg via INTRAVENOUS
  Filled 2012-04-01 (×5): qty 1

## 2012-04-01 NOTE — Progress Notes (Signed)
Patient seen, examined, and I agree with the above documentation, including the assessment and plan. Worsening mood and he feels a little worse today physically.  Tube feeds started yesterday evening and have been increased to 20 mL per hour.  Has only had one bowel movement today Mild nausea no vomiting Didn't sleep well last night, and is asking for something additional for sleep (got 1 mg of IV Ativan nightly for last and this helped her sleep greatly, but lasted for about 14 hours) Really wants to go home, was adamant about this earlier in the day, but after discussions with myself and [name] understands that he needs to stay to fully initiate tube feedings and monitor/replete his electrolyte imbalances  Plan: We'll continue with Ativan 0.5 mg every 8 hours by mouth for anxiety/mood during the day.  Will make Ativan 0.75 mg available each bedtime IV for sleep Received 40 mEq of IV potassium earlier today, potassium increased from 2.5-2.9.  Planning an additional 40 mEq IV now.  Recheck potassium in the morning Receiving 2 g of IV magnesium, recheck magnesium in the morning. Check phosphate in the morning Await pathology IV in right wrist has started to hurt; I've asked that this be replaced with a PICC line given his need for potassium supplementation now and likely in the future.  This also would be useful if he fails enteral nutrition and needs TPN Regular diet as needed for comfort

## 2012-04-01 NOTE — Progress Notes (Signed)
Feedings increased to 20cc/hr, per order. Will continue to monitor.

## 2012-04-01 NOTE — Progress Notes (Signed)
Brief Nutrition Note:  Pt started on enteral nutrition support 1/31. Magnesium and Phosphorus remain WNL with supplementation, however potassium and glucose at a critical low and being repleted per MD.   Vital AF 1.2 is infusing @ 20 ml/hr. Tube feeding regimen currently providing 576 kcal, 36 grams protein  Residuals: 0  Per RN pt tolerating volume of TF without issue. MD adjusted TF advancement to 10 ml every 24 hours.   Glucose, Bld  Date Value Range Status  04/01/2012 34* 70 - 99 mg/dL Final     CRITICAL RESULT CALLED TO, READ BACK BY AND VERIFIED WITH:     DUFFEY,A RN 04/01/2012 0645 JORDANS    Potassium  Date/Time Value Range Status  04/01/2012  5:00 AM 2.5* 3.5 - 5.1 mEq/L Final     CRITICAL RESULT CALLED TO, READ BACK BY AND VERIFIED WITH:     DUFFEY,A RN 04/01/2012 0645 JORDANS  03/29/2012  4:30 AM 4.0  3.5 - 5.1 mEq/L Final  03/27/2012  3:55 AM 3.8  3.5 - 5.1 mEq/L Final     NO VISIBLE HEMOLYSIS     DELTA CHECK NOTED    Phosphorus  Date/Time Value Range Status  04/01/2012  5:00 AM 2.5  2.3 - 4.6 mg/dL Final  03/25/2012  6:54 PM 2.4  2.3 - 4.6 mg/dL Final    Magnesium  Date/Time Value Range Status  04/01/2012  5:00 AM 1.5  1.5 - 2.5 mg/dL Final  03/25/2012  6:54 PM 1.9  1.5 - 2.5 mg/dL Final   Maylon Peppers RD, LDN, CNSC [phone] Weekend/After Hours Pager

## 2012-04-01 NOTE — Progress Notes (Signed)
CRITICAL VALUE ALERT  Critical value received:  K 2.5  Date of notification:  04/01/2012  Time of notification:  0646  Critical value read back:yes  Nurse who received alert:  Hadassah Pais RN BSN  MD notified (1st page):  oncall for [facility]  Time of first page:  [phone]  MD notified (2nd page):  Time of second page:  Responding MD:  Dr. Hilarie Fredrickson  Time MD responded:  [phone]

## 2012-04-01 NOTE — Progress Notes (Signed)
Patient ID: [name], male   DOB: January 31, 1966, 47 y.o.   MRN: [id number] [facility] Progress Note  Subjective: Feels OK-thinks the TF are working-causing some gas but no increase in pain or diarrhea. Receiving K+runs he says he is going home today, that is what everyone has been telling him  and he is checking out.....  Objective:  Vital signs in last 24 hours: Temp:  [97.2 F (36.2 C)-98.4 F (36.9 C)] 97.9 F (36.6 C) (02/01 0537) Pulse Rate:  [78-96] 96  (02/01 0537) Resp:  [15-92] 18  (02/01 0537) BP: (97-130)/(62-85) 110/76 mmHg (02/01 0537) SpO2:  [98 %-100 %] 100 % (02/01 0537) Last BM Date: 03/30/12 General:   Alert,  Well-developed, cachectic appearing WM   in NAD Heart:  Regular rate and rhythm; no murmurs Pulm;clear ant Abdomen:  Soft, nontender and nondistended. Normal bowel sounds, without guarding, and without rebound.   Extremities:  Without edema. Neurologic:  Alert and  oriented x4;  grossly normal neurologically. Psych:  Alert and cooperative. Normal mood and affect.  Intake/Output from previous day: 01/31 0701 - 02/01 0700 In: 950 [P.O.:240; I.V.:600; NG/GT:110] Out: 1000 [Urine:1000] Intake/Output this shift: Total I/O In: 20 [NG/GT:20] Out: -   Lab Results: No results found for this basename: WBC:3,HGB:3,HCT:3,PLT:3 in the last 72 hours BMET  Basename 04/01/12 0500  NA 133*  K 2.5*  CL 95*  CO2 27  GLUCOSE 34*  BUN 4*  CREATININE 0.48*  CALCIUM 7.3*   LFT No results found for this basename: PROT,ALBUMIN,AST,ALT,ALKPHOS,BILITOT,BILIDIR,IBILI in the last 72 hours PT/INR No results found for this basename: LABPROT:2,INR:2 in the last 72 hours    Assessment / Plan: #1  47 yo male with severe Crohns ileocolitis +/_ component of chronic ischemic colitis. Pt has severe mesenteric vascular disease. Awaiting colon bx's from yesterday Plan is to continue steroids TF started yesterday and will need very slow advancement with  concern  for refeeding issues- glucose was 35 early this am. #2 marked electrolyte disturbances- receiving replacement IV -will repeat labs thia afternoon and in am. #3 Anxiety- continue Ativan prn #4 disposition- pt says he is going home today- he is not thinking rationally- he is not safe from a medical standpoint to be discharged and will have to leave AMA which will be detrimental to his health. Active Problems:  Dehydration  Malnutrition  Hematochezia     LOS: 7 days   Amy Esterwood  04/01/2012, 9:42 AM

## 2012-04-01 NOTE — Progress Notes (Signed)
CRITICAL VALUE ALERT  Critical value received: glucose 35  Date of notification:  04/01/2012  Time of notification:  0646  Critical value read back:yes  Nurse who received alert:  Hadassah Pais RN BSN  MD notified (1st page):  oncall for [facility]   Time of first page:  [phone]  MD notified (2nd page):  Time of second page:  Responding MD:  Dr. Hilarie Fredrickson  Time MD responded:  [phone]

## 2012-04-01 NOTE — Progress Notes (Signed)
Pt glucose 35. Pt asymptomatic. Pt given orange juice and amp of D50 per order. K 2.5. New orders received. Will pass information to dayshift RN. Pt updated on information. Will continue to monitor.

## 2012-04-02 DIAGNOSIS — E876 Hypokalemia: Secondary | ICD-10-CM

## 2012-04-02 LAB — PHOSPHORUS: Phosphorus: 1.3 mg/dL — ABNORMAL LOW (ref 2.3–4.6)

## 2012-04-02 LAB — BASIC METABOLIC PANEL
GFR calc Af Amer: >90 mL/min (ref >90)
GFR calc non Af Amer: >90 mL/min (ref >90)
Potassium: 3.1 mEq/L — ABNORMAL LOW (ref 3.5–5.1)
Sodium: 131 mEq/L — ABNORMAL LOW (ref 135–145)

## 2012-04-02 LAB — MAGNESIUM: Magnesium: 1.9 mg/dL (ref 1.5–2.5)

## 2012-04-02 MED ORDER — SODIUM CHLORIDE 0.9 % IJ SOLN
10.0000 mL | Freq: Two times a day (BID) | INTRAMUSCULAR | Status: DC
Start: 2012-04-02 — End: 2012-04-06
  Administered 2012-04-03 – 2012-04-04 (×2): 10 mL

## 2012-04-02 MED ORDER — POTASSIUM PHOSPHATE MONOBASIC 500 MG PO TABS
500.0000 mg | ORAL_TABLET | Freq: Three times a day (TID) | ORAL | Status: DC
  Administered 2012-04-02 – 2012-04-06 (×16): 500 mg via ORAL
  Filled 2012-04-02 (×24): qty 1

## 2012-04-02 MED ORDER — POTASSIUM CHLORIDE 10 MEQ/50ML IV SOLN
10.0000 meq | Freq: Once | INTRAVENOUS | Status: AC
  Administered 2012-04-02: 10 meq via INTRAVENOUS
  Filled 2012-04-02 (×2): qty 50

## 2012-04-02 MED ORDER — POTASSIUM CHLORIDE 10 MEQ/100ML IV SOLN
INTRAVENOUS | Status: AC
  Filled 2012-04-02: qty 100

## 2012-04-02 MED ORDER — PREDNISONE 20 MG PO TABS
30.0000 mg | ORAL_TABLET | Freq: Every day | ORAL | Status: DC
  Filled 2012-04-02: qty 1

## 2012-04-02 MED ORDER — SODIUM CHLORIDE 0.9 % IJ SOLN
10.0000 mL | INTRAMUSCULAR | Status: DC | PRN
  Administered 2012-04-02: 10 mL
  Administered 2012-04-03: 30 mL
  Administered 2012-04-04 – 2012-04-05 (×3): 10 mL

## 2012-04-02 MED ORDER — POTASSIUM CHLORIDE 10 MEQ/50ML IV SOLN
10.0000 meq | Freq: Once | INTRAVENOUS | Status: AC
  Administered 2012-04-02: 10 meq via INTRAVENOUS
  Filled 2012-04-02: qty 50

## 2012-04-02 NOTE — Progress Notes (Signed)
Peripherally Inserted Central Catheter/Midline Placement  The IV Nurse has discussed with the patient and/or persons authorized to consent for the patient, the purpose of this procedure and the potential benefits and risks involved with this procedure.  The benefits include less needle sticks, lab draws from the catheter and patient may be discharged home with the catheter.  Risks include, but not limited to, infection, bleeding, blood clot (thrombus formation), and puncture of an artery; nerve damage and irregular heat beat.  Alternatives to this procedure were also discussed.  PICC/Midline Placement Documentation  PICC / Midline Double Lumen 46/65/99 PICC Right Basilic (Active)       Gordan Payment 04/02/2012, 10:46 AM

## 2012-04-02 NOTE — Evaluation (Signed)
Physical Therapy Evaluation Patient Details Name: [name] MRN: [id number] DOB: Feb 08, 1966 Today's Date: 04/02/2012 Time: 1201-1228 PT Time Calculation (min): 27 min  PT Assessment / Plan / Recommendation Clinical Impression  Pt is a 47 y.o male with GI and nurtritional deficits. Pt presents for therapy with decreased functional mobility secondary to fatigue, deconditioning, weakness, and overall decreased activity tolerance. Pt will benefit from continued skilled PT to address deficits and maximize function for d/c home. Rec continued skilled PT with home health upon discharge.    PT Assessment  Patient needs continued PT services    Follow Up Recommendations  Home health PT    Does the patient have the potential to tolerate intense rehabilitation      Barriers to Discharge        Equipment Recommendations  None recommended by PT    Recommendations for Other Services     Frequency Min 3X/week    Precautions / Restrictions Precautions Precaution Comments: Pt with TPN nutrition Restrictions Weight Bearing Restrictions: No         Mobility  Bed Mobility Bed Mobility: Supine to Sit;Sitting - Scoot to Edge of Bed Supine to Sit: 7: Independent Sitting - Scoot to Edge of Bed: 7: Independent Details for Bed Mobility Assistance: No difficulties Transfers Transfers: Sit to Stand;Stand to Sit Sit to Stand: 6: Modified independent (Device/Increase time);From bed Stand to Sit: 6: Modified independent (Device/Increase time);To bed Details for Transfer Assistance: Increased time required Ambulation/Gait Ambulation/Gait Assistance: 5: Supervision Ambulation Distance (Feet): 160 Feet Assistive device:  (IV pole) Ambulation/Gait Assistance Details: Slow but steady using IV pole for support Gait Pattern: Step-through pattern;Decreased stride length Gait velocity: decreased General Gait Details: steady but slow Stairs: No       Exercises General Exercises - Lower  Extremity Ankle Circles/Pumps: AROM;Both;20 reps Long Arc Quad: AROM;10 reps;Both Hip Flexion/Marching: AROM;10 reps;Both   PT Diagnosis: Difficulty walking;Generalized weakness;Acute pain  PT Problem List: Decreased strength;Decreased range of motion;Decreased activity tolerance;Decreased mobility PT Treatment Interventions: DME instruction;Gait training;Stair training;Functional mobility training;Therapeutic activities;Therapeutic exercise;Patient/family education   PT Goals Acute Rehab PT Goals PT Goal Formulation: With patient Time For Goal Achievement: 04/09/12 Potential to Achieve Goals: Good Pt will go Supine/Side to Sit: Independently PT Goal: Supine/Side to Sit - Progress: Goal set today Pt will go Sit to Supine/Side: Independently PT Goal: Sit to Supine/Side - Progress: Goal set today Pt will go Sit to Stand: Independently PT Goal: Sit to Stand - Progress: Goal set today Pt will go Stand to Sit: Independently PT Goal: Stand to Sit - Progress: Goal set today Pt will Ambulate: >150 feet;Independently PT Goal: Ambulate - Progress: Goal set today Pt will Go Up / Down Stairs: Flight;with modified independence;with rail(s) PT Goal: Up/Down Stairs - Progress: Goal set today  Visit Information  Last PT Received On: 04/02/12 Assistance Needed: +1    Subjective Data  Subjective: I still don't feel much better  Patient Stated Goal: to go home   Prior [place] Lives With: Family Available Help at Discharge: Family;Available 24 hours/day Type of Home: House Home Access: Stairs to enter [employer] of Steps: 2 Entrance Stairs-Rails: None Home Layout: Two level;Full bath on main level Alternate Level Stairs-Number of Steps: 12 Alternate Level Stairs-Rails: Left Bathroom Shower/Tub: Tub/shower unit;Walk-in shower Bathroom Toilet: Standard Home Adaptive Equipment: Walker - rolling;Straight cane;Built-in shower seat Prior Function Level of  Independence: Independent Able to Take Stairs?: Yes Driving: Yes Vocation: Unemployed Communication Communication:  (reading glasses) Dominant  Hand: Left    Cognition       Extremity/Trunk Assessment Right Upper Extremity Assessment RUE ROM/Strength/Tone: WFL for tasks assessed Left Upper Extremity Assessment LUE ROM/Strength/Tone: WFL for tasks assessed Right Lower Extremity Assessment RLE ROM/Strength/Tone: WFL for tasks assessed (generalized weakness and deconditioning) Left Lower Extremity Assessment LLE ROM/Strength/Tone: WFL for tasks assessed (generalized weakness and deconditioning)   Balance Balance Balance Assessed: Yes High Level Balance High Level Balance Activites: Side stepping;Backward walking;Direction changes;Turns;Head turns High Level Balance Comments: Pt steady with activities  End of Session PT - End of Session Equipment Utilized During Treatment: Gait belt Activity Tolerance: Patient tolerated treatment well Patient left: in bed;with call bell/phone within reach;with family/visitor present Nurse Communication: Mobility status  GP     Duncan Dull 04/02/2012, 2:36 PM Alben Deeds, [place] DPT  [phone]

## 2012-04-02 NOTE — Progress Notes (Signed)
[facility] Progress Note  Subjective: Slight fairly well last night Mood somewhat upbeat this morning No pain Tolerating tube feeds with 1 bowel movement since yesterday, nonbloody, some form and not pure liquid  Objective:  Vital signs in last 24 hours: Temp:  [97.3 F (36.3 C)-97.8 F (36.6 C)] 97.3 F (36.3 C) (02/02 0524) Pulse Rate:  [102-103] 102  (02/02 0524) Resp:  [16-17] 17  (02/02 0524) BP: (95-103)/(64-77) 95/64 mmHg (02/02 0524) SpO2:  [97 %-100 %] 97 % (02/02 0524) Last BM Date: 04/01/12 General: Alert, Well-developed, cachectic appearing WM in NAD  HEENT:  nasoenteric feeding tube in place  Heart: Regular rate and rhythm; no mrg  Pulm:  clear anterior Abdomen: Soft, nontender and nondistended. Normal bowel sounds Extremities: Without edema.  Neurologic: Alert and oriented x4; grossly normal neurologically.  Psych: Alert and cooperative.    Intake/Output from previous day: 02/01 0701 - 02/02 0700 In: 440 [NG/GT:440] Out: 1180 [Urine:403; Emesis/NG output:775; Stool:2] Intake/Output this shift:    Lab Results: No results found for this basename: WBC:3,HGB:3,HCT:3,PLT:3 in the last 72 hours BMET  [facility] 04/02/12 0635 04/01/12 1549 04/01/12 0500  NA 131* 133* 133*  K 3.1* 2.9* 2.5*  CL 97 96 95*  CO2 30 32 27  GLUCOSE 90 94 34*  BUN 3* 3* 4*  CREATININE 0.57 0.59 0.48*  CALCIUM 7.3* 7.5* 7.3*    Studies/Results: Dg Abd 1 View  03/31/2012  *RADIOLOGY REPORT*  Clinical Data: NG tube placement.  ABDOMEN - 1 VIEW  Comparison: None.  Findings: Single fluoroscopic image of the abdomen demonstrates an NG tube in place with tip in good position in the distal stomach. The tube was placed by Clyde Canterbury, RT.  IMPRESSION: As above.   Original Report Authenticated By: Orlean Patten, M.D.    Dg Naso G Tube Plc W/fl-no Rad  03/31/2012  CLINICAL DATA: malnutrition   NASO G TUBE PLACEMENT WITH FLUORO  Fluoroscopy was utilized by the requesting  physician.  No radiographic  interpretation.     Assessment / Plan: 47 year old male with severe Crohn's ileocolitis complicated by mesenteric artery disease and likely some component of acute and chronic ischemic ileocolitis, malnutrition  1.  Crohn's/mesenteric insufficiency -- complicated picture, awaiting pathology results from recent colonoscopy. --Await pathology results --Continue tube feeding per nasoenteric tube, see #2 --Prednisone 30 mg daily, will move to hour of sleep at patient request --On Humira  2.  Malnutrition/refeeding syndrome/electrolyte imbalance -- tube feeds running and advancing without problem.  Seems to be tolerating the feeds from a GI standpoint (no increase in nausea, vomiting, or pain).  We are seeing what appears to be refeeding syndrome to some degree and we are closely following potassium, phosphorus, and magnesium.  Evidence of improved but persistent hypokalemia today, and marked hypophosphatemia --Begin K-Phos 500 mg 4 times a day (goal replacement for now is 1.3 mmol/kg.  500 mg K-Phos = 16 mmol). --Will give an additional 20 mEq IV potassium today (K-Phos also has potassium) --Magnesium normal today, follow   3.  Sleep -- Ativan 0.75 mg IV at hour of sleep is helping significantly with sleep we'll continue this dose   4.  Dispo -- home health needs to be arranged for her tube feeds, he also will need at least twice a weekly lab draws to monitor electrolytes.  PICC line placed today which should make electrolyte replacement easier and lab draws easier at home  Active Problems:  Dehydration  Malnutrition  Hematochezia  LOS: 8 days   Devita Nies M  04/02/2012, 10:43 AM

## 2012-04-03 ENCOUNTER — Encounter (HOSPITAL_COMMUNITY): Payer: Self-pay | Admitting: Internal Medicine

## 2012-04-03 LAB — BASIC METABOLIC PANEL
Calcium: 7.1 mg/dL — ABNORMAL LOW (ref 8.4–10.5)
GFR calc Af Amer: >90 mL/min (ref >90)
GFR calc non Af Amer: >90 mL/min (ref >90)
Sodium: 133 mEq/L — ABNORMAL LOW (ref 135–145)

## 2012-04-03 LAB — PHOSPHORUS: Phosphorus: 1.9 mg/dL — ABNORMAL LOW (ref 2.3–4.6)

## 2012-04-03 LAB — MAGNESIUM: Magnesium: 2 mg/dL (ref 1.5–2.5)

## 2012-04-03 MED ORDER — PREDNISONE 20 MG PO TABS
20.0000 mg | ORAL_TABLET | Freq: Once | ORAL | Status: AC
  Administered 2012-04-03: 20 mg via ORAL
  Filled 2012-04-03: qty 1

## 2012-04-03 MED ORDER — PREDNISOLONE 5 MG PO TABS: 10.0000 mg | ORAL_TABLET | Freq: Once | ORAL | Status: DC

## 2012-04-03 MED ORDER — POTASSIUM CHLORIDE 2 MEQ/ML IV SOLN
INTRAVENOUS | Status: AC
  Filled 2012-04-03: qty 1000

## 2012-04-03 MED ORDER — PREDNISONE 20 MG PO TABS
30.0000 mg | ORAL_TABLET | Freq: Every day | ORAL | Status: DC
  Administered 2012-04-04 – 2012-04-05 (×2): 30 mg via ORAL
  Filled 2012-04-03 (×3): qty 1

## 2012-04-03 MED ORDER — POTASSIUM CHLORIDE 2 MEQ/ML IV SOLN
INTRAVENOUS | Status: DC
  Administered 2012-04-04 – 2012-04-05 (×3): via INTRAVENOUS
  Filled 2012-04-03 (×7): qty 1000

## 2012-04-03 MED ORDER — PREDNISONE 10 MG PO TABS
10.0000 mg | ORAL_TABLET | Freq: Once | ORAL | Status: AC
  Administered 2012-04-03: 10 mg via ORAL
  Filled 2012-04-03: qty 1

## 2012-04-03 MED ORDER — PREDNISOLONE 5 MG PO TABS: 20.0000 mg | ORAL_TABLET | Freq: Once | ORAL | Status: DC

## 2012-04-03 MED ORDER — SODIUM CHLORIDE 0.9 % IV SOLN
20.0000 mmol | Freq: Once | INTRAVENOUS | Status: AC
  Administered 2012-04-03: 20 mmol via INTRAVENOUS
  Filled 2012-04-03: qty 20

## 2012-04-03 NOTE — Progress Notes (Signed)
Physical Therapy Treatment Patient Details Name: [name] MRN: [id number] DOB: Oct 22, 1965 Today's Date: 04/03/2012 Time: 8757-9728 PT Time Calculation (min): 19 min  PT Assessment / Plan / Recommendation Comments on Treatment Session  Pt continues to make steady progress towards PT goals. Still feel pt will benefit from HHPT evaluation for home environment navigation and safety.    Follow Up Recommendations  Home health PT     Does the patient have the potential to tolerate intense rehabilitation     Barriers to Discharge        Equipment Recommendations  None recommended by PT    Recommendations for Other Services    Frequency Min 3X/week   Plan Discharge plan remains appropriate    Precautions / Restrictions Precautions Precaution Comments: Pt with TPN nutrition Restrictions Weight Bearing Restrictions: No   Pertinent Vitals/Pain No pain    Mobility  Bed Mobility Bed Mobility: Supine to Sit;Sitting - Scoot to Edge of Bed Supine to Sit: 7: Independent Sitting - Scoot to Edge of Bed: 7: Independent Details for Bed Mobility Assistance: No difficulties Transfers Transfers: Sit to Stand;Stand to Sit Sit to Stand: 6: Modified independent (Device/Increase time);From bed Stand to Sit: 6: Modified independent (Device/Increase time);To bed Details for Transfer Assistance: Increased time required Ambulation/Gait Ambulation/Gait Assistance: 5: Supervision Ambulation Distance (Feet): 400 Feet Assistive device:  (IV pole) Ambulation/Gait Assistance Details: Slow but steady using IV pole for support Gait Pattern: Step-through pattern;Decreased stride length Gait velocity: decreased General Gait Details: steady but slow Stairs: No      PT Goals Acute Rehab PT Goals PT Goal Formulation: With patient Time For Goal Achievement: 04/09/12 Potential to Achieve Goals: Good Pt will go Supine/Side to Sit: Independently PT Goal: Supine/Side to Sit - Progress:  Progressing toward goal Pt will go Sit to Supine/Side: Independently Pt will go Sit to Stand: Independently PT Goal: Sit to Stand - Progress: Progressing toward goal Pt will go Stand to Sit: Independently PT Goal: Stand to Sit - Progress: Progressing toward goal Pt will Ambulate: >150 feet;Independently PT Goal: Ambulate - Progress: Progressing toward goal Pt will Go Up / Down Stairs: Flight;with modified independence;with rail(s)  Visit Information  Last PT Received On: 04/03/12 Assistance Needed: +1    Subjective Data  Subjective: I feel much better and gained 4 lbs  Patient Stated Goal: to go home   Cognition       Balance  Balance Balance Assessed: Yes High Level Balance High Level Balance Activites: Side stepping;Backward walking;Direction changes;Turns;Head turns High Level Balance Comments: Pt steady with activities  End of Session PT - End of Session Equipment Utilized During Treatment: Gait belt Activity Tolerance: Patient tolerated treatment well Patient left: in bed;with call bell/phone within reach;with family/visitor present Nurse Communication: Mobility status   GP     [name] 04/03/2012, 12:08 PM Alben Deeds, [place] DPT  [phone]

## 2012-04-03 NOTE — Progress Notes (Signed)
     [facility] Daily Rounding Note 04/03/2012, 10:08 AM  SUBJECTIVE:       Seen as he was walking with PT in hallway. 2 pudding like , non-bloody BMs yesterday, one today.  No abdominal pain, no nausea, feels like he is tolerating the tube feeds well.   OBJECTIVE:         Vital signs in last 24 hours:    Temp:  [97.5 F (36.4 C)-98.2 F (36.8 C)] 97.5 F (36.4 C) (02/03 9407) Pulse Rate:  [85-102] 85  (02/03 0608) Resp:  [16-18] 16  (02/03 0608) BP: (92-104)/(53-66) 92/58 mmHg (02/03 0608) SpO2:  [100 %] 100 % (02/03 6808) Weight:  [58.2 kg (128 lb 4.9 oz)] 58.2 kg (128 lb 4.9 oz) (02/03 0500) Last BM Date: 04/02/12 General: looks cachectic, but overall looks better and stronger   Heart: RRR Chest: clear Abdomen: active BS, scaphoid, not tender  Extremities: no pedal edema Neuro/Psych:  Pleasant, in better spirits, relaxed, appropriate  Intake/Output from previous day: 02/02 0701 - 02/03 0700 In: 1680 [P.O.:250; I.V.:1075; NG/GT:355] Out: -   Intake/Output this shift:    Lab Results: No results found for this basename: WBC:3,HGB:3,HCT:3,PLT:3 in the last 72 hours BMET  [facility] 04/03/12 0555 04/02/12 0635 04/01/12 1549  NA 133* 131* 133*  K 3.7 3.1* 2.9*  CL 103 97 96  CO2 26 30 32  GLUCOSE 126* 90 94  BUN 7 3* 3*  CREATININE 0.39* 0.57 0.59  CALCIUM 7.1* 7.3* 7.5*  Mag              2.0 Phos            1.9  ASSESMENT: *   Ileocolitis. Known hx Crohn's, probable concomitant mesenteric ischemia. Colonoscopy 03/31/12:  Biopsies pending.  Active ileocolitis has not responded to initiation doses of Humira, steroids.  S/P angiography 1/29, Dr Oneida Alar:  Has mesenteric occlusive disease with collateral circulation.  Per Dr Oneida Alar note of 1/30 "Best scenario is to cool down his Crohn's disease and then re-evaluate."  *  Hyponatremia.  This was present prior to starting Remeron.   *  Malnutrition, anorexia on nasalgastric tube feeds since 04/01/12. Allowed regular diet as  desired. Experiencing refeeding syndrome with persistent hypophosphatemia.   *  Anxiety, ?  Obsessive/compulsive disorder.  Remeron initiated 1/26. *  Primary biliary cirrhossis. *  Decreased mobility, deconditioning.  PT suggests home PT at discharge.  *  S/P PICC line placement 04/02/12  PLAN: *  Consult to pharmacy for phosphate and other electrolyte management.  *  TF goal is 65 per hour but with refeeding syndrome, leave at 30 ml/hour for today.    LOS: 9 days   Azucena Freed  04/03/2012, 10:08 AM Pager: [phone]    I have taken an interval history, reviewed the chart and examined the patient. I agree with the Advanced Practitioner's note, impression and recommendations.   Pricilla Riffle. Fuller Plan MD Marval Regal

## 2012-04-03 NOTE — Progress Notes (Signed)
NUTRITION FOLLOW UP  Intervention:   1. MD/pharmacy: Continue to monitor magnesium, potassium, and phosphorus daily for at least 3 days, replete as needed, as pt is at risk for refeeding syndrome given severe malnutrition. 2. Goal TF rate is Vital AF 1.2 at 65 ml/hr. This will provide: 1872 kcal, 117 grams protein, 1265 ml free water. Pt has required a slow advancement of enteral nutrition given ongoing refeeding syndrome and electrolyte imbalances. Recommend continued slow advancement per team discretion. 3. RD to continue to follow nutrition care plan  Nutrition Dx:   Malnutrition related to poor PO intake as evidenced by < 75% meal completion for > 1 month and severe fat and muscle wasting; ongoing.  Goal:   Meet >/= 90% of estimated nutrition needs. Unmet at this time  Monitor:   Tolerance of TF, PO's, I/O's, weight trends, labs  Assessment:   Continues with Regular diet order at this time, consuming 25% - 50% of meals per patient.  Pt is currently receiving Vital AF 1.2 at 30 ml/hr. Per MD, plan to leave pt at this rate for today. This rate is currently providing: 864 kcal, 54 grams protein, 584 ml free water. This meets 48% of minimum estimated kcal needs and 60% of minimum estimate protein needs at this time. RN confirms that pt is tolerating EN regimen well at this time.   RD contacted by RN - pt asking RN what sort of vitamin/mineral supplements he should take when he goes home. This RD discussed this with pt. He reports that he notices that he has required a lot of potassium and phosphorus repletion and is wondering how he can make sure that he is maintaining adequate levels at home. Discussed cellular response to feeding starved cells and told pt that once he is at goal rate and labs have been repleted, his labs ideally will begin to normalize and become more stable, and he will no longer require such high levels of repletion. Pt verbalized understanding and appreciative of  information.  Per chart, pt experiencing refeeding syndrome at this time. Potassium and magnesium are currently WNL, however phosphorus remains significantly low at 1.9. Pharmacy is attempting IV supplementation of phosphorus at this time, as pt is currently on oral phosphorus repletion however levels remain low.   Height: Ht Readings from Last 1 Encounters:  03/25/12 6' 1"  (1.854 m)    Weight Status:   Wt Readings from Last 1 Encounters:  04/03/12 128 lb 4.9 oz (58.2 kg)  Admit wt 130 lb - stable  Re-estimated needs:  Kcal: 1800-2000 Protein: 90-110 gm Fluid: 1.8-2L  Skin: intact, no wounds noted  Diet Order: General   Intake/Output Summary (Last 24 hours) at 04/03/12 1142 Last data filed at 04/03/12 0600  Gross per 24 hour  Intake   1680 ml  Output      0 ml  Net   1680 ml    Last BM: 2/2   Labs:   Lab 04/03/12 0555 04/02/12 0635 04/01/12 1549 04/01/12 0500  NA 133* 131* 133* --  K 3.7 3.1* 2.9* --  CL 103 97 96 --  CO2 26 30 32 --  BUN 7 3* 3* --  CREATININE 0.39* 0.57 0.59 --  CALCIUM 7.1* 7.3* 7.5* --  MG 2.0 1.9 -- 1.5  PHOS 1.9* 1.3* -- 2.5  GLUCOSE 126* 90 94 --    CBG (last 3)   Basename 04/01/12 0740  GLUCAP 241*    Scheduled Meds:    . feeding  supplement (VITAL AF 1.2 CAL)  1,000 mL Per Tube Q24H  . hydrocortisone   Topical BID  . LORazepam  0.75 mg Intravenous QHS  . mirtazapine  15 mg Oral QHS  . pantoprazole  40 mg Oral Q0600  . potassium phosphate (monobasic)  500 mg Oral TID WC & HS  . predniSONE  10 mg Oral Once  . predniSONE  20 mg Oral Once  . predniSONE  30 mg Oral QHS  . sodium chloride  10-40 mL Intracatheter Q12H  . sodium glycerophosphate 0.9% NaCl IVPB  20 mmol Intravenous Once    Continuous Infusions:    . dextrose 5 % and 0.45 % NaCl with KCl 10 mEq/L 75 mL/hr at 04/02/12 2004    Inda Coke MS, RD, LDN Pager: [phone] After-hours pager: [phone]

## 2012-04-03 NOTE — Progress Notes (Signed)
Pharmacy - Hypophosphatemia management  Pt with history of severe Crohn's with multiple electrolyte imbalances including hypophosphatemia. Hypokalemia has been treated and is now resolved. Pt is currently on oral phosphorous repletion but phos levels is markedly low at 1.9 today. Will attempt IV supplementation.   Plan: 1. Sodium glycerophosphate 20mol IV x 1 2. F/u AM phos level - will supplement further as needed  RSalome Arnt PharmD, BCPS Pager # [phone]/04/2012 10:49 AM

## 2012-04-04 ENCOUNTER — Other Ambulatory Visit: Payer: Self-pay

## 2012-04-04 DIAGNOSIS — I714 Abdominal aortic aneurysm, without rupture, unspecified: Secondary | ICD-10-CM

## 2012-04-04 DIAGNOSIS — K551 Chronic vascular disorders of intestine: Secondary | ICD-10-CM

## 2012-04-04 LAB — CBC
HCT: 26 % — ABNORMAL LOW (ref 39.0–52.0)
MCHC: 35 g/dL (ref 30.0–36.0)
MCV: 90.6 fL (ref 78.0–100.0)
RDW: 15.1 % (ref 11.5–15.5)

## 2012-04-04 LAB — BASIC METABOLIC PANEL
BUN: 5 mg/dL — ABNORMAL LOW (ref 6–23)
Chloride: 106 mEq/L (ref 96–112)
Creatinine, Ser: 0.41 mg/dL — ABNORMAL LOW (ref 0.50–1.35)
GFR calc Af Amer: >90 mL/min (ref >90)

## 2012-04-04 MED ORDER — SODIUM CHLORIDE 0.9 % IV SOLN
20.0000 mmol | Freq: Once | INTRAVENOUS | Status: AC
  Administered 2012-04-04: 20 mmol via INTRAVENOUS
  Filled 2012-04-04: qty 20

## 2012-04-04 MED ORDER — ADALIMUMAB 40 MG/0.8ML ~~LOC~~ KIT
40.0000 mg | PACK | SUBCUTANEOUS | Status: DC
  Administered 2012-04-05: 40 mg via SUBCUTANEOUS

## 2012-04-04 MED ORDER — NON FORMULARY: 40.0000 mg | Status: DC

## 2012-04-04 MED ORDER — SODIUM GLYCEROPHOSPHATE 1 MMOLE/ML IV SOLN
10.0000 mmol | Freq: Once | INTRAVENOUS | Status: AC
  Administered 2012-04-04: 10 mmol via INTRAVENOUS
  Filled 2012-04-04 (×2): qty 10

## 2012-04-04 MED ORDER — SODIUM GLYCEROPHOSPHATE 1 MMOLE/ML IV SOLN
10.0000 mmol | Freq: Once | INTRAVENOUS | Status: DC
  Filled 2012-04-04: qty 10

## 2012-04-04 NOTE — Progress Notes (Signed)
Physical Therapy Treatment Patient Details Name: [name] MRN: [id number] DOB: [birth year] Today's Date: 04/04/2012 Time: 1015-1040 PT Time Calculation (min): 25 min  PT Assessment / Plan / Recommendation Comments on Treatment Session  Pt continues to demonstrate good preogress in activity tolerance at this time. Pt provided ther-ex and techniques for pressure relief secondary to wound formation in crevice of buttocks (nsg aware, addressed with foam dressing). Pt performed stair negotiation techniques today with minimal unsteadiness.  Pt will benefit from one more acute PT session to maximize independence and ensure further stability and comfort on stairs.  (Will discuss possibility of Outpatient vs HHPT next sessioni)    Follow Up Recommendations  Home health PT     Does the patient have the potential to tolerate intense rehabilitation     Barriers to Discharge        Equipment Recommendations  None recommended by PT    Recommendations for Other Services    Frequency Min 3X/week   Plan Discharge plan remains appropriate    Precautions / Restrictions Precautions Precaution Comments: Pt with TPN nutrition Restrictions Weight Bearing Restrictions: No   Pertinent Vitals/Pain No pain reported    Mobility  Bed Mobility Bed Mobility: Supine to Sit;Sitting - Scoot to Edge of Bed Supine to Sit: 7: Independent Sitting - Scoot to Edge of Bed: 7: Independent Details for Bed Mobility Assistance: No difficulties Transfers Transfers: Sit to Stand;Stand to Sit Sit to Stand: 6: Modified independent (Device/Increase time);From bed Stand to Sit: 6: Modified independent (Device/Increase time);To bed Details for Transfer Assistance: Increased time required Ambulation/Gait Ambulation/Gait Assistance: 5: Supervision Ambulation Distance (Feet): 200 Feet Assistive device:  (IV pole) Gait Pattern: Step-through pattern;Decreased stride length Gait velocity: decreased General Gait  Details: steady but slow Stairs: Yes Stairs Assistance: 4: Min guard Stair Management Technique: Forwards;One rail Right Number of Stairs: 6  (two trials)    Exercises General Exercises - Lower Extremity Hip Flexion/Marching: AROM;10 reps;Both Mini-Sqauts: AROM;10 reps;Both Amputee Exercises Chair Push Up: 5 reps;Seated (Instructed for pressure relief secondary to pressure wound ) Other Exercises Other Exercises: Iinstructed in relief exercises and body positioning supine in bed     PT Goals Acute Rehab PT Goals PT Goal Formulation: With patient Time For Goal Achievement: 04/09/12 Potential to Achieve Goals: Good Pt will go Supine/Side to Sit: Independently PT Goal: Supine/Side to Sit - Progress: Progressing toward goal Pt will go Sit to Supine/Side: Independently PT Goal: Sit to Supine/Side - Progress: Progressing toward goal Pt will go Sit to Stand: Independently PT Goal: Sit to Stand - Progress: Progressing toward goal Pt will go Stand to Sit: Independently PT Goal: Stand to Sit - Progress: Progressing toward goal Pt will Ambulate: >150 feet;Independently PT Goal: Ambulate - Progress: Progressing toward goal Pt will Go Up / Down Stairs: Flight;with modified independence;with rail(s) PT Goal: Up/Down Stairs - Progress: Progressing toward goal  Visit Information  Last PT Received On: 04/04/12 Assistance Needed: +1    Subjective Data  Subjective: I'm excited I gained 11 pounds in 2 days Patient Stated Goal: to go home      Balance  Balance Balance Assessed: Yes High Level Balance High Level Balance Activites: Side stepping;Backward walking;Direction changes;Turns;Head turns High Level Balance Comments: Pt steady with activities  End of Session PT - End of Session Equipment Utilized During Treatment: Gait belt Activity Tolerance: Patient tolerated treatment well Patient left: in bed;with call bell/phone within reach;with family/visitor present Nurse Communication:  Mobility status   GP  Duncan Dull 04/04/2012, 10:54 AM Alben Deeds, PT DPT  [phone]

## 2012-04-04 NOTE — Progress Notes (Addendum)
[name] Daily Rounding Note 04/04/2012, 9:30 AM  SUBJECTIVE:       One, soft unformed, non-bloody stool yesterday.  No nausea, no abdominal pain.  Walking without problems.  Some dizziness at times.  Says he had problems with hypotension PTA and prior to Remeron.  Slept well. Feels less panicky.  OBJECTIVE:         Vital signs in last 24 hours:    Temp:  [97.8 F (36.6 C)-98.2 F (36.8 C)] 97.8 F (36.6 C) (02/04 0546) Pulse Rate:  [87-104] 87  (02/04 0546) Resp:  [16-20] 16  (02/04 0546) BP: (83-99)/(54-66) 99/61 mmHg (02/04 0546) SpO2:  [98 %-100 %] 98 % (02/04 0546) Weight:  [58 kg (127 lb 13.9 oz)] 58 kg (127 lb 13.9 oz) (02/04 0546) Last BM Date: 04/03/12 General: looks better, brighter affect   Heart: RRR Chest: clear B Abdomen: soft, NT,  Thin, active BS  Extremities: no pedal or extremity edema Neuro/Psych:  Pleasant, relaxed.    Intake/Output from previous day: 02/03 0701 - 02/04 0700 In: 2114 [P.O.:480; I.V.:1238; NG/GT:292; IV Piggyback:104] Out: 1900 [Urine:1900]  Intake/Output this shift:    Lab Results:  Basename 04/04/12 0500  WBC 5.5  HGB 9.1*  HCT 26.0*  PLT 109*   BMET  Basename 04/04/12 0500 04/03/12 0555 04/02/12 0635  NA 138 133* 131*  K 3.9 3.7 3.1*  CL 106 103 97  CO2 28 26 30   GLUCOSE 128* 126* 90  BUN 5* 7 3*  CREATININE 0.41* 0.39* 0.57  CALCIUM 7.3* 7.1* 7.3*   Surgical Pathology of 03/31/12 Diagnosis 1. Terminal ileum, biopsy - CHRONIC ACTIVE ILEITIS, SEE COMMENT. - NEGATIVE FOR DYSPLASIA. 2. Colon, biopsy, Right - ULCERATED SEVERELY ACTIVE CHRONIC COLITIS, SEE COMMENT. - NEGATIVE FOR DYSPLASIA. 3. Colon, polyp(s), ? right - POLYPOID, ULCERATED CHRONIC ACTIVE COLITIS, SEE COMMENT. - NEGATIVE FOR DYSPLASIA. 4. Colon, biopsy, Transverse - MODERATELY ACTIVE CHRONIC COLITIS, SEE COMMENT. - NEGATIVE FOR DYSPLASIA. 5. Colon, biopsy, Sigmoid - MODERATELY ACTIVE CHRONIC COLITIS, SEE COMMENT. - NEGATIVE FOR DYSPLASIA. 6.  Rectum, polyp(s), Rectosigmoid - FRAGMENTS OF LOW GRADE DYSPLASIA/TUBULAR ADENOMAS; NEGATIVE FOR HIGH GRADE DYSPLASIA OR MALIGNANCY, SEE COMMENT. 7. Rectum, biopsy - MILDLY ACTIVE CHRONIC PROCTITIS, SEE COMMENT. - NEGATIVE FOR DYSPLASIA Microscopic Comment 1. 2, 3, 4, 5. and 7. This is a challenging case. Although there are definitive features of chronic mucosal injury/inflammatory bowel disease involving the terminal ileum, right colon, and rectosigmoid biopsies, there is significant hypercryptation and lamina propria hyalinization in the biopsies taken from watershed areas of the colon (part 2 greater than part 5). Given the history of superior and inferior mesenteric arterial stenosis, there is a high likelihood that the additional morphologic features present are that of concomitant ischemic type mucosal injury. However, there is significant overlap of these features with those identified in longstanding idiopathic inflammatory bowel disease (Crohn's disease). A CMV immunostain will performed on the ulcerated mucosa (parts 2 and 3) and will be reported in an addendum. Finally, there are no features to suggest concomitant involvement by infectious (C. difficile) colitis. The case was discussed extensively with Dr. Hilarie Fredrickson on 04/03/2012. 6. Although sporadic adenomatous dysplasia (tubular adenoma) and polyp-like dysplasia arising in idiopathic bowel disease have identical morphologic features, both are considered adequately treated with complete polypectomy. (CRR:caf 04/03/12) Mali RUND DO Pathologist,   . ASSESMENT: * Crohn's and ischemic Ileocolitis.  Colonoscopy 1/31with multiple biopsies, polyp biopsy and polypectomy. There is some improvement in terms of stool frequency, resolution  of gross bleeding after initiation doses of Humira, high dose steroids.  However on 03/31/12 colonoscopy still  had extensive ileocolitis.  S/P angiography 1/29, Dr Oneida Alar: Has mesenteric occlusive disease with  collateral circulation. Per Dr Oneida Alar note of 1/30 "Best scenario is to cool down his Crohn's disease and then re-evaluate."  * Hyponatremia. This was present prior to starting Remeron. Resolved.  * Malnutrition, anorexia on nasalgastric tube feeds since 04/01/12. Allowed regular diet as desired. Experiencing refeeding syndrome with persistent hypophosphatemia. Weight not yet at admission level.  Tube feeds of Vital AF1.2, goal rate is 65 ml/hour.  Current rate is 30 ml/ hour *  Hypophosphatemia.  Persists. Pharmacy is managing. *  Hypotension.  Remeron can cause orthostatic hypotension.  *  Anxiety, ? Obsessive/compulsive disorder. Remeron initiated 1/26.  * Primary biliary cirrhossis.  * Decreased mobility, deconditioning:  Improving. PT suggests HHPT evaluation for home environment navigation and safety.   * S/P PICC line placement 04/02/12   PLAN: *  Encouraged to walk on his own in the hall. *  Will check orthostatic vitals *  Increase TF to 50 ML/hour. Leave IVF at 75/hour *  Follow phos and chemistries.  *  Due for next Humira, the first of his maintenance doses, tomorrow.  I will order this, Mom will bring the syringe from home.    LOS: 10 days   Azucena Freed  04/04/2012, 9:30 AM Pager: [phone]    I have taken an interval history, reviewed the chart and examined the patient. I agree with the Advanced Practitioner's note, impression and recommendations. Begin discharge planning-possibly ready for home in next 1-2 days with home health to monitor TFs. Phos improved but still low at 2.0.   Pricilla Riffle. Fuller Plan MD Marval Regal

## 2012-04-04 NOTE — Discharge Summary (Signed)
[facility] Discharge Summary  Name: [name] MRN: [id number] DOB: [birth year] 47 y.o. PCP:  [name], MD  Date of Admission: 03/25/2012  4:41 PM Date of Discharge: 04/06/2012 Attending Physician: [name], MD  Admitting Dignosis:  Dehydration  Malnutrition, weight loss  Crohn's ileocolitis  Anxiety, depression  Sclerosing cholangitis    Past Medical History  Diagnosis Date  . Crohn's disease of small and large intestines   . Hypercholesterolemia   . History of alcohol abuse   . History of substance abuse   . Depression   . 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 Primary sclerosing cholangitis     ? hepatitis overlap - liver bx x 2 and MRCP  . Allergy     SEASONAL  . Anxiety   . Cataract     BILATERAL/REMOVED  . Hypertension   . Personal history of adenomatous colonic polyps 12/2010, 03/2012    12/2010 - 8 mm serrated adenoma of rectum  . Chronic mesenteric ischemia   . Basal cell carcinoma of lower extremity 06/2011    on left leg  . Dysplastic nevus 06/2011    on right back.    Past Surgical History  Procedure Date  . Cataract extraction, bilateral   . Pylonidal cyst removal   . Foot surgery     right  . Percutaneous liver biopsy 2007 and 2008  . Colonoscopy 2001, 05/02/2003, 01/28/11    2012: Right colon Crohn's, rectal polyp  . Esophagogastroduodenoscopy 01/28/11    Normal  . Colonoscopy 03/31/2012    Procedure: COLONOSCOPY;  Surgeon: Jerene Bears, MD;  Location: [facility] ENDOSCOPY;  Service: Gastroenterology;  Laterality: N/A;   Brief History [name] is a 47 y.o. male with Crohn's disease and PSC/possible hepatitis, followed by Dr Carlean Purl. Due to multiple drug intolerances his medical treatment has consisted solely of Prednisone. Had been relatively stable until last few months with increasing RUQ pain and diarrhea.  Crohn's known to involve the right colon. Prednisone increased to 40 mg daily and had partial but  limited response.  With progressing sxs, Humira initiated in early January 2014.   The second dose of Humira was on 1/22.  Dr Carlean Purl admitted the patient on 1/25 when he called c/o bloody diarrhea 3-4 x a day.  He was also having post prandial abdominal pain.  His diet consisted solely of two [place] legs a day mixed in reverse osmosis (filtered) water. Additionally he was having insomnia, anorexia and had not bathed in 1 week. He had become increasingly weak. He denied fevers or other constitutional sxs.    Discharge Diagnosis/Hospital Course by problem list: 1.  Ileo colitis.        Overlapping Crohn's disease and mesenteric ischemia. He was initially treated with IV Solumedrol, ultimately he was transitioned back to home doses of Prednisone 30 mg daily. Humira had been started as out patient.  He received 3rd dose thus far, 40 mg subcutaneous, on 04/05/12. As the hospital stay progressed, his abdominal pain resolved. Stool consistency improved from mostly watery and bloody to non-bloody and semi-formed.  He was having about 1 to 2 stools daily at time of discharge.   Pt had CT angiogram showing SMA and possible IMA stenosis.  He underwent mesenteric angiography by Dr Trula Slade 03/29/12 which confirmed significant stenosis of SMA and IMA.  Dr Oneida Alar plans to defer mesenteric bypass for now and reassess when Crohn's has cooled down.  He will arrange follow  up CT in 4 to 6 weeks and vascular surgery office follow up.   Pt also had colonoscopy on 03/31/12.  This confirmed sever ileocolitis within terminal ileum and throughout the entire colon. Colon polyps were either biopsied or removed.  Pathology confirmed chronic mucosal inflammation, c/w known Crohn's, as well as high likelihood of concomitant ischemic mucosal injury.   The polyps biopsied and or removed showed adenomatous dysplasia.   2.  Anxiety and depression:  These improved with initiation of Remeron. Additionally insomnia resolved. He states that  prednisone makes him sleepy and prefers to take this at night where it can aid sleep.   3.  Protein calorie malnutrition and weight loss.        Refeeding syndrome Nasogastric tube feedings were initiated,  He tolerated these well.  He was eating regular diet in addition.  As the tube feeds were initiated, with slow increase in rate to goal of 65 ml/hour, the pt developed hypophosphatemia and hypokalemia.  These were successfully corrected with a combination of oral and IV replacement.   He had mild hyperglycemia to the 120s and 130s, this is also felt to be secondary to refeeding syndrome.   Plan was for home NGT feedings.  However, at the last minute it was discovered that [facility] advised [place] that Medicaid would not cover feedings when he was eating > 75% of meals po.  So the NGT was removed and pt planned to use OTC liquid nutrition supplement ( his preference was for [employer]) 4 times daily.  For the time being the oral phosphate supplement will be continued.  The plan is to optimize his nutrition enough to improve his candidacy for vascular surgery/intervention.  4.  Primary sclerosing cholangitis with possible overlay of autoimmune hepatitis. This was not an active issue.  His LFTs, with the exception of low albumin, were normal.   5.  Adenomatous colon polyps.  See # 1.  Pt had adenomatous colon polyps in 12/2010.  He had recurrent adenomatous polyps on 03/31/12 colonoscopy  6.  Thrombocytopenia. The patient's platelets dropped from 141 to 109. They were on upward trend at 133 on day of discharge     7.  Normocytic anemia. Hgb dropped from 14.5 to 12.2 on hospital day #2,  after his dehydration was treated with IVF.  Hgb dropped further to 8.6 on hosp day 12. It had been several days since he had any bleeding per rectum.  Suspect that the decline reflects further hydration resulting from steady improvement in his po intake along with additional tube feedings.   His folate level was low at 347 (normal is > 366) and he was started on folic acid on 06/30/86 for 30 days.  Iron level was 46, ferritin was 693, so Iron was not initiated. B 12 level was also well wnl.  Plan to get repeat CBC next week at office.   8.  Physical Deconditioning. Pt worked with PT on strengthening.  He was walking longer and longer distances with less and less fatigue as he approached discharge.  PT recommended home PT in order to continue strengthening.  PT expressed concern that if he was to attempt to strength train at home, he would be overzealous and place himself in a catabolic state, setting back his overall recovery.  Home health PT was therefore ordered.     Consultations:   1. Ruta Hinds of Vascular surgery.   2.  Nutritional services for management of tube feedings.  Procedures Performed:  Mesenteric angiogram:03/29/12  Dr Annamarie Major #1 no significant celiac artery stenosis is identified. This provides opacification of the superior mesenteric artery via gastroduodenal collaterals.  #2 high-grade superior mesenteric artery stenosis/occlusion. The superior mesenteric artery fills from celiac collaterals.  #3 high-grade stenosis at the origin of the inferior mesenteric artery. A large arc of [place] and is identified.  Colonoscopy 03/31/12:   1. Severe ileitis was found in the terminal ileum; multiple  biopsies of the area were performed using cold forceps  2. Colitis was found throughout the entire examined colon; most  significant cecum to transverse colon; multiple biopsies were  performed using cold forceps  3. Possible polyp measuring 6-7 mm in size was found in the  ascending colon; multiple biopsies of the lesion were performed  4. Sessile polyp measuring 5 mm in size was found in the  rectosigmoid colon; polypectomy was performed with a cold snare   Radiology Imaging Dg Abd 1 View 03/31/2012  *RADIOLOGY REPORT*  Clinical Data: NG tube placement.   ABDOMEN - 1 VIEW  Comparison: None.  Findings: Single fluoroscopic image of the abdomen demonstrates an NG tube in place with tip in good position in the distal stomach. The tube was placed by Clyde Canterbury, RT.  IMPRESSION: As above.   Original Report Authenticated By: Orlean Patten, M.D.    Ct Abdomen Pelvis W Contrast 03/26/2012   IMPRESSION:  1.  Acute inflammation involving the terminal ileum and contiguous involvement of the ascending colon with findings consistent with active Crohn's disease.  The ascending colon involvement appears more significant than terminal ileum involvement.  There likely is some degree of stricture at the ileocecal valve.  No significant associated bowel obstruction is identified.  There is no evidence of bowel perforation or focal abscess.  Some pockets of free fluid are present in the retroperitoneum and pelvis. 2.  Stable fibrotic changes of the liver related to the known underlying primary sclerosing cholangitis and also depicted on the recent MRI study. 3.  Probable moderate narrowing at the origin of the superior mesenteric artery.  This may not be of clinical significance to bowel perfusion given widely patent celiac axis and inferior mesenteric artery.  Correlation is suggested with any symptoms suggestive of intestinal angina.  Formal CTA evaluation would better depicted patency of the SMA.   Original Report Authenticated By: Aletta Edouard, M.D.    Ct Angio Abd/pel W/ And/or W/o 03/27/2012   IMPRESSION:  1.  Non calcified atherosclerotic plaque involving the origin and proximal aspect of the SMA results in a short segment high-grade stenosis.  This is likely a longstanding phenomenon as there is a robust collateral supply from the widely patent celiac artery via the GDA to supply the proximal aspect of the SMA. 2.  Likely hemodynamically significant narrowing involving the origin of the IMA  3.  Grossly unchanged long segment wall thickening involving primarily the  cecum and ascending colon and to a lesser extent, the terminal ileum, findings again compatible providing history of active Crohn's disease. 4.  Interval development of a small amount of intra-abdominal fluid without definable/organized fluid collection.  5.  Grossly stable fibrotic changes involving the liver compatible with provided history of primary sclerosing cholangitis as depicted on recent MRI.   Original Report Authenticated By: Jake Seats, MD     Discharge Vitals:  BP 130/79  Pulse 108  Temp 97.7 F (36.5 C) (Oral)  Resp 20  Ht 6' 1"  (1.854 m)  Wt  62 kg (136 lb 11 oz)  BMI 18.03 kg/m2  SpO2 98% Weight:  04/05/2012 62 kg (136 lb 11 oz)    Discharge Labs:  Results for orders placed during the hospital encounter of 03/25/12 (from the past 24 hour(s))  PHOSPHORUS     Status: Normal   Collection Time   04/05/12  3:13 PM      Component Value Range   Phosphorus 2.3  2.3 - 4.6 mg/dL  BASIC METABOLIC PANEL     Status: Abnormal   Collection Time   04/06/12  6:34 AM      Component Value Range   Sodium 139  135 - 145 mEq/L   Potassium 4.3  3.5 - 5.1 mEq/L   Chloride 107  96 - 112 mEq/L   CO2 29  19 - 32 mEq/L   Glucose, Bld 127 (*) 70 - 99 mg/dL   BUN 10  6 - 23 mg/dL   Creatinine, Ser 0.36 (*) 0.50 - 1.35 mg/dL   Calcium 7.6 (*) 8.4 - 10.5 mg/dL   GFR calc non Af Amer >90  >90 mL/min   GFR calc Af Amer >90  >90 mL/min  MAGNESIUM     Status: Normal   Collection Time   04/06/12  6:34 AM      Component Value Range   Magnesium 1.7  1.5 - 2.5 mg/dL  CBC     Status: Abnormal   Collection Time   04/06/12  6:34 AM      Component Value Range   WBC 8.8  4.0 - 10.5 K/uL   RBC 2.66 (*) 4.22 - 5.81 MIL/uL   Hemoglobin 8.6 (*) 13.0 - 17.0 g/dL   HCT 24.4 (*) 39.0 - 52.0 %   MCV 91.7  78.0 - 100.0 fL   MCH 32.3  26.0 - 34.0 pg   MCHC 35.2  30.0 - 36.0 g/dL   RDW 15.9 (*) 11.5 - 15.5 %   Platelets 133 (*) 150 - 400 K/uL  VITAMIN B12     Status: Abnormal   Collection Time   04/06/12  6:34  AM      Component Value Range   Vitamin B-12 998 (*) 211 - 911 pg/mL  IRON AND TIBC     Status: Abnormal   Collection Time   04/06/12  6:34 AM      Component Value Range   Iron 46  42 - 135 ug/dL   TIBC 149 (*) 215 - 435 ug/dL   Saturation Ratios 31  20 - 55 %   UIBC 103 (*) 125 - 400 ug/dL  FERRITIN     Status: Abnormal   Collection Time   04/06/12  6:34 AM      Component Value Range   Ferritin 693 (*) 22 - 322 ng/mL  RETICULOCYTES     Status: Abnormal   Collection Time   04/06/12  6:34 AM      Component Value Range   Retic Ct Pct 2.3  0.4 - 3.1 %   RBC. 2.66 (*) 4.22 - 5.81 MIL/uL   Retic Count, Manual 61.2  19.0 - 186.0 K/uL    Disposition and follow-up:   Mr.[name] was discharged in stable and much improved condition.    Follow-up Appointments:     Discharge Orders    Future Appointments: Provider: Department: Dept Phone: Center:   04/18/2012 11:30 AM [name], MD [facility] [phone] [facility]   05/04/2012 1:15 PM Elam Dutch,  MD [facility] [phone] [facility]        04/12/12                              [facility]   04/12/12   Future Orders Please Complete By Expires   Diet general      Increase activity slowly        Diet at Discharge: Regular  Nutritional supplement such as Carn[employer] Instant breakfast or Ensure, aim for 4 cans or servings daily  Discharge Medications:   Medication List     As of 04/06/2012  1:59 PM    STOP taking these medications         ALPRAZolam 0.5 MG tablet   Commonly known as: XANAX      TAKE these medications         adalimumab 40 MG/0.8ML injection   Commonly known as: HUMIRA   Inject 40 mg into the vein every 14 (fourteen) days. Inject one pen sq every other week after the completion of the starter pack      adalimumab 40 MG/0.8ML injection   Commonly known as: HUMIRA   Inject 0.8 mLs (40 mg total) into the skin every 14 (fourteen) days.       feeding supplement Liqd   Take 237 mLs by mouth 2 (two) times daily between meals.      folic acid 1 MG tablet   Commonly known as: FOLVITE   Take 1 tablet (1 mg total) by mouth daily.      mirtazapine 15 MG tablet   Commonly known as: REMERON   Take 1 tablet (15 mg total) by mouth at bedtime.      potassium phosphate (monobasic) 500 MG tablet   Commonly known as: K-PHOS ORIGINAL   Take 1 tablet (500 mg total) by mouth 4 (four) times daily -  with meals and at bedtime.      predniSONE 20 MG tablet   Commonly known as: DELTASONE   Take 15 mg by mouth 2 (two) times daily. Take as directed      predniSONE 20 MG tablet   Commonly known as: DELTASONE   Take as directed      zolpidem 5 MG tablet   Commonly known as: AMBIEN   Take 1 tablet (5 mg total) by mouth at bedtime as needed for sleep.          Time Spent on discharge: 90 minutes.    Signed: Mordecai Maes  [phone] 04/06/2012, 1:59 PM

## 2012-04-04 NOTE — Progress Notes (Signed)
Increased tube feed to 50 per Azucena Freed at [phone]

## 2012-04-04 NOTE — Progress Notes (Signed)
NUTRITION FOLLOW UP  Intervention:   1. MD/pharmacy: Continue to monitor magnesium, potassium, and phosphorus daily throughout advancement of nutrition support, replete as needed, as pt is at risk for refeeding syndrome given severe malnutrition. 2. Goal TF rate is Vital AF 1.2 at 65 ml/hr. This will provide: 1872 kcal, 117 grams protein, 1265 ml free water. Pt has required a slow advancement of enteral nutrition given ongoing refeeding syndrome and electrolyte imbalances. Recommend continued slow advancement per team discretion. 3. RD to continue to follow nutrition care plan  Nutrition Dx:   Malnutrition related to poor PO intake as evidenced by < 75% meal completion for > 1 month and severe fat and muscle wasting; ongoing.  Goal:   Meet >/= 90% of estimated nutrition needs. Unmet at this time  Monitor:   Tolerance of TF, PO's, I/O's, weight trends, labs  Assessment:   Continues with Regular diet order at this time, consuming 50-100% of meals per patient.  Pt is currently receiving Vital AF 1.2 at 50 ml/hr - pt was just increased to this per [name] order. This rate is currently providing: 1440 kcal, 90 grams protein, 973 ml free water. This meets 80% of minimum estimated kcal needs and 100% of minimum estimate protein needs at this time. RN confirms that pt is tolerating EN regimen well at this time.   Per chart, pt experiencing refeeding syndrome at this time. Potassium and magnesium are currently WNL, however phosphorus remains significantly low at 1.9. Pharmacy continues to supplement phosphorus intravenously at this time. Most recent potassium and magnesium WNL.  Height: Ht Readings from Last 1 Encounters:  03/25/12 6' 1"  (1.854 m)    Weight Status:   Wt Readings from Last 1 Encounters:  04/04/12 136 lb 14.4 oz (62.097 kg)  Admit wt 130 lb - stable  Re-estimated needs:  Kcal: 1800-2000 Protein: 90-110 gm Fluid: 1.8-2L  Skin: intact, no wounds noted  Diet Order:  General   Intake/Output Summary (Last 24 hours) at 04/04/12 1239 Last data filed at 04/04/12 1035  Gross per 24 hour  Intake   2234 ml  Output   1900 ml  Net    334 ml    Last BM: 2/3   Labs:   Lab 04/04/12 0500 04/03/12 0555 04/02/12 0635 04/01/12 0500  NA 138 133* 131* --  K 3.9 3.7 3.1* --  CL 106 103 97 --  CO2 28 26 30  --  BUN 5* 7 3* --  CREATININE 0.41* 0.39* 0.57 --  CALCIUM 7.3* 7.1* 7.3* --  MG -- 2.0 1.9 1.5  PHOS 2.0* 1.9* 1.3* --  GLUCOSE 128* 126* 90 --    CBG (last 3)  No results found for this basename: GLUCAP:3 in the last 72 hours  Scheduled Meds:    . feeding supplement (VITAL AF 1.2 CAL)  1,000 mL Per Tube Q24H  . hydrocortisone   Topical BID  . LORazepam  0.75 mg Intravenous QHS  . mirtazapine  15 mg Oral QHS  . pantoprazole  40 mg Oral Q0600  . potassium phosphate (monobasic)  500 mg Oral TID WC & HS  . predniSONE  30 mg Oral QHS  . sodium chloride  10-40 mL Intracatheter Q12H  . sodium glycerophosphate 0.9% NaCl IVPB  20 mmol Intravenous Once   Followed by  . sodium glycerophosphate 0.9% NaCl IVPB  10 mmol Intravenous Once    Continuous Infusions:    . dextrose 5 %-0.45% NaCl with KCl Pediatric custom IV fluid  75 mL/hr at 04/04/12 0011    [name] Coke MS, RD, LDN Pager: [phone] After-hours pager: [phone]

## 2012-04-04 NOTE — Progress Notes (Signed)
Pharmacy - Hypophosphatemia management  Pt with history of severe Crohn's with multiple electrolyte imbalances including hypophosphatemia. Hypokalemia has been treated and is now resolved. Potassium today is 3.9 but phosphorous remains low at 2 even after 47mol supplementation yesterday. Continues on oral phosphorous repletion. Will give additional IV phosphorous today. Pt is at risk for refeeding per RD.   Plan: 1. Sodium glycerophosphate 363ml IV x 1 - will take 16 hours to infuse, so no utility in checking a repeat phosphorous level until tomorrow morning 2. F/u AM phos level - will supplement further as needed  RaSalome ArntPharmD, BCPS Pager # [phone]/05/2012 7:43 AM

## 2012-04-04 NOTE — Progress Notes (Signed)
Pt tolerating diet, less pain daily Will sign off  Our office will arrange follow up with repeat CT 4-6 weeks  Ruta Hinds, MD [facility]: [phone] Pager: [phone]

## 2012-04-05 ENCOUNTER — Encounter (HOSPITAL_COMMUNITY): Payer: Self-pay | Admitting: Physician Assistant

## 2012-04-05 LAB — BASIC METABOLIC PANEL
BUN: 6 mg/dL (ref 6–23)
Calcium: 7.6 mg/dL — ABNORMAL LOW (ref 8.4–10.5)
GFR calc non Af Amer: >90 mL/min (ref >90)
Glucose, Bld: 133 mg/dL — ABNORMAL HIGH (ref 70–99)
Sodium: 140 mEq/L (ref 135–145)

## 2012-04-05 MED ORDER — FOLIC ACID 1 MG PO TABS
1.0000 mg | ORAL_TABLET | Freq: Every day | ORAL | Status: DC
  Administered 2012-04-05 – 2012-04-06 (×2): 1 mg via ORAL
  Filled 2012-04-05 (×2): qty 1

## 2012-04-05 MED ORDER — VITAL AF 1.2 CAL PO LIQD
1000.0000 mL | ORAL | Status: DC
  Administered 2012-04-05: 1000 mL
  Filled 2012-04-05 (×2): qty 1000
  Filled 2012-04-05: qty 237
  Filled 2012-04-05 (×2): qty 1000

## 2012-04-05 NOTE — Progress Notes (Signed)
[facility] Daily Rounding Note 04/05/2012, 10:10 AM  SUBJECTIVE:       Eating significant majority of regular diet trays without the gas, cramping pain he was having before and which caused anorexia.  Tolerating TF at 50 ml/hour.  No nausea.  2 formed stools yesterday.  Not dizzy.  Feels stronger, taking a lot of walks.  Weight is up from low of 55.4 kg to 62 kg.   OBJECTIVE:         Vital signs in last 24 hours:    Temp:  [97.3 F (36.3 C)-98.1 F (36.7 C)] 97.9 F (36.6 C) (02/05 0530) Pulse Rate:  [91-108] 94  (02/05 0530) Resp:  [16-20] 18  (02/05 0530) BP: (87-101)/(51-67) 93/59 mmHg (02/05 0530) SpO2:  [96 %-100 %] 98 % (02/05 0530) Weight:  [62 kg (136 lb 11 oz)] 62 kg (136 lb 11 oz) (02/05 0530) Last BM Date: 04/04/12 General: looks better   Heart: RRR Chest: clear B.  Breathing is not labored Abdomen: soft, NT, ND, less scaphoid  Extremities: no pedal edema Neuro/Psych:  Pleasant, affect not as flat.   Intake/Output from previous day: 02/04 0701 - 02/05 0700 In: 5285 [P.O.:1080; I.V.:2199; NG/GT:1736; IV Piggyback:270] Out: 2706 [Urine:1450]  Intake/Output this shift:    Lab Results:  Basename 04/04/12 0500  WBC 5.5  HGB 9.1*  HCT 26.0*  PLT 109*   BMET  Basename 04/05/12 0500 04/04/12 0500 04/03/12 0555  NA 140 138 133*  K 4.1 3.9 3.7  CL 107 106 103  CO2 28 28 26   GLUCOSE 133* 128* 126*  BUN 6 5* 7  CREATININE 0.37* 0.41* 0.39*  CALCIUM 7.6* 7.3* 7.1*  Phos            3.0   (normal range: 2.3 - 4.6)  ASSESMENT: * Crohn's and ischemic Ileocolitis.  * Hyponatremia. This was present prior to starting Remeron. Resolved.  * Malnutrition, anorexia on nasalgastric tube feeds since 04/01/12. Allowed regular diet as desired. Experiencing refeeding syndrome with hypophosphatemia now resolved. Weight not yet at admission level.  Tube feeds of Vital AF1.2, goal rate is 65 ml/hour. Current rate is 50 ml/ hour.  * Hypophosphatemia, resolved.  Pharmacy is  managing.  * Hypotension. No orthostatic hypotension on vital signs.  Tolerating this well.  This was a prexisting problem. * Anxiety, depression. Remeron initiated 1/26.  * Primary sclerosing cholangitis with ? Overlapping autoimmune hepatitis.  * Decreased mobility, deconditioning: Improving. PT suggests HHPT evaluation for home environment navigation and safety.  * S/P PICC line placement 04/02/12 *  Hyperglycemia.  On his baseline 30 mg dose of Prednisone, and the TF and increase in oral intake may also be contributing.  Need to watch glucose levels.  PLAN: *  Advance tube feeds to 65 ml per hour.  When he goes home will he be on continuous TF or should he go to a nocturnal feeding schedule with 12 on/12 off?   Would appreciate nutritional mgt guidance with this.  Since he is now eating a lot better, this might be viable option and help pt to resume as close to normal a life as possible.  Pt and family need education on mgt of the feeding pump and tube, HHN will be able to provide this education.   *  Will discontinue IVF and watch labs and blood pressure.  *  Will get 40 mg dose of Humira today and q2 week dosing going forward. *  Should he  go home with PICC line in place as insurance in case he needs IV potassium, phosphate etc.      LOS: 11 days   Azucena Freed  04/05/2012, 10:10 AM Pager: [phone]   I have taken an interval history, reviewed the chart and examined the patient. I agree with the Advanced Practitioner's note, impression and recommendations. Phos has normalize. TF regimen with 12 hr feeding and 12 hours off to allow mobility and PO intake would be ideal. Will plan to DC the PICC line before discharge. Received Humira today as planned. Anticipate discharge tomorrow. OP follow up with [name].  Pricilla Riffle. Fuller Plan MD Marval Regal

## 2012-04-05 NOTE — Progress Notes (Signed)
NUTRITION FOLLOW UP  Intervention:   1. MD/pharmacy: Continue to monitor magnesium, potassium, and phosphorus daily throughout advancement of nutrition support, replete as needed, as pt is at risk for refeeding syndrome given severe malnutrition. 2. Pt is currently eating very well, recommend transition to nocturnal feeding to help supplement po intake.   With current level of po intake (75-100% of meals), recommend nocturnal feedings of Vital AF 1.2 at 65 ml/hr x 12 hours daily. This will provide: 936 kcal, 59 grams protein, 633 ml free water and will meet approximately 52% of estimated kcal needs and 66% of estimated protein needs.  If po intake declines (50% of meals or less), recommend pt transition back to continuous feedings of Vital AF 1.2 at 65 ml/hr x 24 hours, this will provide 1872 kcal, 117 grams protein, 1265 ml free water and will meet 100% of estimated kcal and protein needs. 3. RD to continue to follow nutrition care plan  Nutrition Dx:   Malnutrition related to poor PO intake as evidenced by < 75% meal completion for > 1 month and severe fat and muscle wasting; ongoing.  Goal:   Meet >/= 90% of estimated nutrition needs. Unmet at this time  Monitor:   Tolerance of TF, PO's, I/O's, weight trends, labs  Assessment:   Continues with Regular diet order at this time, consuming 50-100% of meals per patient. Pt denies any issues tolerating diet.  Pt is currently receiving Vital AF 1.2 at 65 ml/hr - pt was just increased to this per [name] order. This rate is currently providing: 1872 kcal, 117 grams protein, 1265 ml free water. This meets 100% of minimum estimated kcal needs and 100% of minimum estimate protein needs at this time. RN confirms that pt is tolerating EN regimen well at this time.   Per chart, pt has experienced refeeding syndrome during advancement of nutrition support. Most recent potassium, phosphorus and magnesium WNL.  Per most recent GI note, question  as to whether pt will be on continuous TF vs nocturnal. This RD called Azucena Freed to discuss plan for home TF regimen. Per Judson Roch, plan is to not pursue a PEG at this time, and pt will go home with NGT for a few weeks. Recommendations are in interventions above.  Height: Ht Readings from Last 1 Encounters:  03/25/12 6' 1"  (1.854 m)    Weight Status:   Wt Readings from Last 1 Encounters:  04/05/12 136 lb 11 oz (62 kg)  Admit wt 130 lb - stable  Re-estimated needs:  Kcal: 1800-2000 Protein: 90-110 gm Fluid: 1.8-2L  Skin: intact, no wounds noted  Diet Order: General   Intake/Output Summary (Last 24 hours) at 04/05/12 1208 Last data filed at 04/05/12 1114  Gross per 24 hour  Intake   5655 ml  Output   1950 ml  Net   3705 ml    Last BM: 2/4   Labs:   Lab 04/05/12 0500 04/04/12 0500 04/03/12 0555 04/02/12 0635 04/01/12 0500  NA 140 138 133* -- --  K 4.1 3.9 3.7 -- --  CL 107 106 103 -- --  CO2 28 28 26  -- --  BUN 6 5* 7 -- --  CREATININE 0.37* 0.41* 0.39* -- --  CALCIUM 7.6* 7.3* 7.1* -- --  MG -- -- 2.0 1.9 1.5  PHOS 3.0 2.0* 1.9* -- --  GLUCOSE 133* 128* 126* -- --    CBG (last 3)  No results found for this basename: GLUCAP:3 in the last  72 hours  Scheduled Meds:    . adalimumab  40 mg Subcutaneous Q14 Days  . hydrocortisone   Topical BID  . LORazepam  0.75 mg Intravenous QHS  . mirtazapine  15 mg Oral QHS  . pantoprazole  40 mg Oral Q0600  . potassium phosphate (monobasic)  500 mg Oral TID WC & HS  . predniSONE  30 mg Oral QHS  . sodium chloride  10-40 mL Intracatheter Q12H    Continuous Infusions:    . dextrose 5 %-0.45% NaCl with KCl Pediatric custom IV fluid Stopped (04/05/12 1114)  . feeding supplement (VITAL AF 1.2 CAL)      Inda Coke MS, RD, LDN Pager: [phone] After-hours pager: [phone]

## 2012-04-06 ENCOUNTER — Telehealth: Payer: Self-pay | Admitting: Vascular Surgery

## 2012-04-06 LAB — CBC
HCT: 24.4 % — ABNORMAL LOW (ref 39.0–52.0)
MCHC: 35.2 g/dL (ref 30.0–36.0)
MCV: 91.7 fL (ref 78.0–100.0)
RDW: 15.9 % — ABNORMAL HIGH (ref 11.5–15.5)

## 2012-04-06 LAB — BASIC METABOLIC PANEL
CO2: 29 mEq/L (ref 19–32)
Calcium: 7.6 mg/dL — ABNORMAL LOW (ref 8.4–10.5)
Creatinine, Ser: 0.36 mg/dL — ABNORMAL LOW (ref 0.50–1.35)
Glucose, Bld: 127 mg/dL — ABNORMAL HIGH (ref 70–99)

## 2012-04-06 LAB — RETICULOCYTES
Retic Count, Absolute: 61.2 10*3/uL (ref 19.0–186.0)
Retic Ct Pct: 2.3 % (ref 0.4–3.1)

## 2012-04-06 LAB — IRON AND TIBC: TIBC: 149 ug/dL — ABNORMAL LOW (ref 215–435)

## 2012-04-06 LAB — FERRITIN: Ferritin: 693 ng/mL — ABNORMAL HIGH (ref 22–322)

## 2012-04-06 MED ORDER — FOLIC ACID 1 MG PO TABS: 1.0000 mg | ORAL_TABLET | Freq: Every day | ORAL | Status: DC

## 2012-04-06 MED ORDER — ZOLPIDEM TARTRATE 5 MG PO TABS: 5.0000 mg | ORAL_TABLET | Freq: Every evening | ORAL | Status: DC | PRN

## 2012-04-06 MED ORDER — MIRTAZAPINE 15 MG PO TABS: 15.0000 mg | ORAL_TABLET | Freq: Every day | ORAL | Status: DC

## 2012-04-06 MED ORDER — POTASSIUM PHOSPHATE MONOBASIC 500 MG PO TABS: 500.0000 mg | ORAL_TABLET | Freq: Three times a day (TID) | ORAL | Status: DC

## 2012-04-06 MED ORDER — PREDNISONE 20 MG PO TABS: ORAL_TABLET | ORAL | Status: DC

## 2012-04-06 MED ORDER — ENSURE COMPLETE PO LIQD: 237.0000 mL | Freq: Two times a day (BID) | ORAL | Status: DC

## 2012-04-06 MED ORDER — VITAL AF 1.2 CAL PO LIQD: 1000.0000 mL | ORAL | Status: DC

## 2012-04-06 MED ORDER — ADALIMUMAB 40 MG/0.8ML ~~LOC~~ KIT: PACK | SUBCUTANEOUS | Status: DC

## 2012-04-06 MED ORDER — ADALIMUMAB 40 MG/0.8ML ~~LOC~~ KIT: 40.0000 mg | PACK | SUBCUTANEOUS | Status: DC

## 2012-04-06 NOTE — Progress Notes (Signed)
Physical Therapy Treatment Patient Details Name: [name] MRN: [id number] DOB: October 15, 1965 Today's Date: 04/06/2012 Time: 4818-5631 PT Time Calculation (min): 34 min  PT Assessment / Plan / Recommendation Comments on Treatment Session  Pt making steady progress with goals of PT. Do not feel pt demonstrates acute needs at this time, however, pt continues to demonstrate generalized weakness and deconditioning. In current state, feel pt would benefit greatly from outpatient PT follow up for improvements in functional mobility, endurance, and strength with controlled environment that can properly monitor activity while taking into account pts current medical and caloric concerns. Please consider Outpatient PT orders for d/c. It has been a pleasure working with Mr. Woodrick. Acute PT will sign off.    Follow Up Recommendations  Outpatient PT           Equipment Recommendations  None recommended by PT          Plan All goals met and education completed, patient dischaged from PT services;Discharge plan needs to be updated    Precautions / Restrictions Precautions Precaution Comments: Pt with TPN nutrition Restrictions Weight Bearing Restrictions: No   Pertinent Vitals/Pain 0/10; no pain reported    Mobility  Bed Mobility Bed Mobility: Supine to Sit;Sitting - Scoot to Edge of Bed Supine to Sit: 7: Independent Sitting - Scoot to Edge of Bed: 7: Independent Details for Bed Mobility Assistance: No difficulties Transfers Transfers: Sit to Stand;Stand to Sit Sit to Stand: 6: Modified independent (Device/Increase time);From bed Stand to Sit: 6: Modified independent (Device/Increase time);To bed Details for Transfer Assistance: Increased time required Ambulation/Gait Ambulation/Gait Assistance: 6: Modified independent (Device/Increase time) Assistive device:  (IV pole) Gait Pattern: Step-through pattern;Decreased stride length Gait velocity: decreased General Gait Details:  steady but slow Stairs: No    Exercises General Exercises - Lower Extremity Ankle Circles/Pumps: AROM;Both;20 reps Long Arc Quad: AROM;10 reps;Both Hip Flexion/Marching: AROM;10 reps;Both Mini-Sqauts: AROM;10 reps;Both Other Exercises Other Exercises: Instructed in 4 way Tband exercises for improvements in LE weakness     PT Goals Acute Rehab PT Goals PT Goal Formulation: With patient Time For Goal Achievement: 04/09/12 Potential to Achieve Goals: Good Pt will go Supine/Side to Sit: Independently PT Goal: Supine/Side to Sit - Progress: Met Pt will go Sit to Supine/Side: Independently PT Goal: Sit to Supine/Side - Progress: Met Pt will go Sit to Stand: Independently PT Goal: Sit to Stand - Progress: Met Pt will go Stand to Sit: Independently PT Goal: Stand to Sit - Progress: Met Pt will Ambulate: >150 feet;Independently PT Goal: Ambulate - Progress: Progressing toward goal Pt will Go Up / Down Stairs: Flight;with modified independence;with rail(s)  Visit Information  Last PT Received On: 04/06/12 Assistance Needed: +1    Subjective Data  Subjective: I am excited to go home Patient Stated Goal: to go home      Balance  Balance Balance Assessed: Yes High Level Balance High Level Balance Activites: Side stepping;Backward walking;Direction changes;Turns;Head turns High Level Balance Comments: Pt steady with activities  End of Session PT - End of Session Equipment Utilized During Treatment: Gait belt Activity Tolerance: Patient tolerated treatment well Patient left: in bed;with call bell/phone within reach;with family/visitor present Nurse Communication: Mobility status   GP     Duncan Dull 04/06/2012, 2:19 PM Alben Deeds, [place] DPT  [phone]

## 2012-04-06 NOTE — Discharge Summary (Signed)
[name]. [name] Plan MD Marval [name]

## 2012-04-06 NOTE — Progress Notes (Signed)
Neither Medicaid nor advanced home care  will cover NG tube feeds, nor the apparently expensive Vital formula he is taking in hospital.    Will need to take the feedings by mouth.  Will have to switch the feedings to Ensure at 4 cans per day.   Discussed with Dr Fuller Plan, care mgt and nutrition.  Adjustments made to the med list.  I discussed this with pt on phone, he is not at all concerned about the changes, did say he prefers to try [employer] as an alternative to the Ensure.   His flexibility in this change of events reflects improvement in his overall sense of wellbeing and in his anxiety.   Lilli Few. Fuller Plan MD Marval Regal

## 2012-04-06 NOTE — Progress Notes (Signed)
     [facility] Daily Rounding Note 04/06/2012, 10:24 AM  SUBJECTIVE:       Feels well, stronger every day.  About 4 soft semi-formed not bloody stools yesterday, one this AM. Eating most of his trays.  No belly or rectal pain  OBJECTIVE:         Vital signs in last 24 hours:    Temp:  [97.5 F (36.4 C)-98.3 F (36.8 C)] 97.7 F (36.5 C) (02/06 0552) Pulse Rate:  [95-107] 95  (02/06 0552) Resp:  [18] 18  (02/06 0552) BP: (108-118)/(66-68) 108/68 mmHg (02/06 0552) SpO2:  [99 %-100 %] 100 % (02/06 0552) Last BM Date: 04/06/12 General: looks better   Heart: RRR Chest: clear  Abdomen: soft, NT, ND.  Active BS  Extremities: no cce Neuro/Psych:  Pleasant, less anxious.  Intake/Output from previous day: 02/05 0701 - 02/06 0700 In: 1742.5 [P.O.:840; I.V.:392.5; NG/GT:510] Out: 1200 [Urine:1200]  Intake/Output this shift: Total I/O In: 480 [P.O.:480] Out: -   Lab Results:  Basename 04/06/12 0634 04/04/12 0500  WBC 8.8 5.5  HGB 8.6* 9.1*  HCT 24.4* 26.0*  PLT 133* 109*   BMET  Basename 04/06/12 0634 04/05/12 0500 04/04/12 0500  NA 139 140 138  K 4.3 4.1 3.9  CL 107 107 106  CO2 29 28 28   GLUCOSE 127* 133* 128*  BUN 10 6 5*  CREATININE 0.36* 0.37* 0.41*  CALCIUM 7.6* 7.6* 7.3*    ASSESMENT: * Crohn's and ischemic Ileocolitis. Bleeding resolved.   * Malnutrition, anorexia on nasalgastric tube feeds since 04/01/12.  Also eating regular diet.  Refeeding syndrome with hypophosphatemia now resolved. Weight not yet at admission level. Prealbumin on 1/25 was 8.3. Tube feeds of Vital AF1.2 at goal rate of 65 ml/hour.  * Hypophosphatemia, resolved.  * Hypotension. No orthostatic hypotension on vital signs. Tolerating this well. This was a prexisting problem.  * Anxiety, depression. Much improved. Remeron initiated 1/26.  * Primary sclerosing cholangitis with ?overlapping autoimmune hepatitis. LFTs normal. * Deconditioning: Improving.  * S/P PICC line placement 04/02/12  *  Hyperglycemia. On his baseline 30 mg dose of Prednisone.  This may also be secondary to refeeding syndrome.  Levels not high enough to warrant meds and reluctant to label him diabetic unless this persists beyond nect 2 months.  *  Anemia:  Normocytic.  Started on 30 day course of folic acid yesterday, as level was low.  Iron and B 12 levels pending *  Thrombocytopenia, improved.    PLAN: *  Discharge home today.  Advanced home care.  Added Ambien to meds per pt request.  Tube feeds will be for 12 daily starting in evenings.  30 mg prednisone. Regular diet.  Continue oral phosphate for now.  If phos levels normal next week may be able to stop this.  Labs next Wednesday 2/12 at [facility] office.    LOS: 12 days   [name]  04/06/2012, 10:24 AM Pager: [phone]    I have taken an interval history, reviewed the chart and examined the patient. I agree with the Advanced Practitioner's note, impression and recommendations. Discharge today.  Pricilla Riffle. Fuller Plan MD Marval Regal

## 2012-04-06 NOTE — Telephone Encounter (Addendum)
Message copied by Lujean Amel on Thu Apr 06, 2012  4:30 PM ------      Message from: Elam Dutch      Created: Tue Apr 04, 2012  2:58 PM       Pt can come off list      He needs a follow up appt with me as well as CT abdomen pelvis with IV and oral contrast with that appt.            Takumi  I scheduled an appt for the above patient for 05/04/12 at 1:15pm w/ [facility]. The pt has a CT prior at 12noon at [facility]. I mailed the info to the patient and also notified his mother of the appts/awt

## 2012-04-06 NOTE — Progress Notes (Signed)
Discharge instructions/Med Rec Sheet reviewed w/ pt. Pt expressed understanding and copies given w/ prescriptions. Pt d/c'd in stable condition via w/c, accompanied by NT

## 2012-04-07 ENCOUNTER — Telehealth: Payer: Self-pay | Admitting: Internal Medicine

## 2012-04-07 ENCOUNTER — Other Ambulatory Visit: Payer: Self-pay

## 2012-04-07 ENCOUNTER — Ambulatory Visit: Payer: Medicare Other | Admitting: Internal Medicine

## 2012-04-07 DIAGNOSIS — D696 Thrombocytopenia, unspecified: Secondary | ICD-10-CM

## 2012-04-07 DIAGNOSIS — E46 Unspecified protein-calorie malnutrition: Secondary | ICD-10-CM

## 2012-04-07 NOTE — Telephone Encounter (Signed)
Patient advised that per [name] this is not surprising.  He should elevate his feet through out the day as much as possible.  Office visit rescheduled for 04/12/12

## 2012-04-07 NOTE — Progress Notes (Signed)
Orders put in per Azucena Freed PA-C.

## 2012-04-10 ENCOUNTER — Telehealth: Payer: Self-pay | Admitting: Internal Medicine

## 2012-04-10 DIAGNOSIS — F329 Major depressive disorder, single episode, unspecified: Secondary | ICD-10-CM | POA: Diagnosis not present

## 2012-04-10 DIAGNOSIS — IMO0001 Reserved for inherently not codable concepts without codable children: Secondary | ICD-10-CM | POA: Diagnosis not present

## 2012-04-10 DIAGNOSIS — K5289 Other specified noninfective gastroenteritis and colitis: Secondary | ICD-10-CM | POA: Diagnosis not present

## 2012-04-10 DIAGNOSIS — E46 Unspecified protein-calorie malnutrition: Secondary | ICD-10-CM | POA: Diagnosis not present

## 2012-04-10 DIAGNOSIS — K5090 Crohn's disease, unspecified, without complications: Secondary | ICD-10-CM | POA: Diagnosis not present

## 2012-04-10 DIAGNOSIS — F3289 Other specified depressive episodes: Secondary | ICD-10-CM | POA: Diagnosis not present

## 2012-04-10 DIAGNOSIS — K921 Melena: Secondary | ICD-10-CM | POA: Diagnosis not present

## 2012-04-10 NOTE — Telephone Encounter (Signed)
Will discuss med changes when he returns He could try two 5 mg Ambien at bedtime now

## 2012-04-10 NOTE — Telephone Encounter (Signed)
Patient wanted [name] to know that he is taking xanax at [facility] and 5 mg Ambien, he some times will need to take a 2nd Ambien later in the night to stay asleep.  Is this ok? He needs a refill should we increase to 10 mg ambien?   He also wants to discuss adding some nursing services to his Home health, he will talk with you about that at the office visit on Wed.

## 2012-04-11 NOTE — Telephone Encounter (Signed)
Patient advised.

## 2012-04-12 ENCOUNTER — Ambulatory Visit (INDEPENDENT_AMBULATORY_CARE_PROVIDER_SITE_OTHER): Payer: Medicare Other | Admitting: Internal Medicine

## 2012-04-12 ENCOUNTER — Other Ambulatory Visit (INDEPENDENT_AMBULATORY_CARE_PROVIDER_SITE_OTHER): Payer: Medicare Other

## 2012-04-12 ENCOUNTER — Encounter: Payer: Self-pay | Admitting: Internal Medicine

## 2012-04-12 VITALS — BP 90/58 | HR 76 | Ht 73.0 in | Wt 142.6 lb

## 2012-04-12 DIAGNOSIS — K551 Chronic vascular disorders of intestine: Secondary | ICD-10-CM

## 2012-04-12 DIAGNOSIS — E46 Unspecified protein-calorie malnutrition: Secondary | ICD-10-CM

## 2012-04-12 DIAGNOSIS — K5080 Crohn's disease of both small and large intestine without complications: Secondary | ICD-10-CM

## 2012-04-12 DIAGNOSIS — D696 Thrombocytopenia, unspecified: Secondary | ICD-10-CM

## 2012-04-12 DIAGNOSIS — F332 Major depressive disorder, recurrent severe without psychotic features: Secondary | ICD-10-CM

## 2012-04-12 DIAGNOSIS — E43 Unspecified severe protein-calorie malnutrition: Secondary | ICD-10-CM | POA: Diagnosis not present

## 2012-04-12 LAB — PHOSPHORUS: Phosphorus: 3.3 mg/dL (ref 2.3–4.6)

## 2012-04-12 LAB — BASIC METABOLIC PANEL
Calcium: 8.2 mg/dL — ABNORMAL LOW (ref 8.4–10.5)
GFR: 196.96 mL/min (ref >60.00)
Glucose, Bld: 102 mg/dL — ABNORMAL HIGH (ref 70–99)
Potassium: 4.6 mEq/L (ref 3.5–5.1)
Sodium: 140 mEq/L (ref 135–145)

## 2012-04-12 LAB — CBC WITH DIFFERENTIAL/PLATELET
Basophils Absolute: 0 10*3/uL (ref 0.0–0.1)
Eosinophils Absolute: 0.1 10*3/uL (ref 0.0–0.7)
HCT: 31.3 % — ABNORMAL LOW (ref 39.0–52.0)
Lymphs Abs: 1.6 10*3/uL (ref 0.7–4.0)
MCHC: 33.5 g/dL (ref 30.0–36.0)
MCV: 95.7 fl (ref 78.0–100.0)
Monocytes Absolute: 0.9 10*3/uL (ref 0.1–1.0)
Neutrophils Relative %: 71.3 % (ref 43.0–77.0)
Platelets: 199 10*3/uL (ref 150.0–400.0)
RDW: 17.3 % — ABNORMAL HIGH (ref 11.5–14.6)

## 2012-04-12 MED ORDER — MIRTAZAPINE 30 MG PO TABS: 30.0000 mg | ORAL_TABLET | Freq: Every day | ORAL | Status: DC

## 2012-04-12 MED ORDER — ALPRAZOLAM 1 MG PO TABS: 2.0000 mg | ORAL_TABLET | Freq: Every evening | ORAL | Status: DC | PRN

## 2012-04-12 NOTE — Patient Instructions (Addendum)
[name] has made some medicine changes today.  Keep Prednisone dose the same.  Hold Ambien and try using the Xanax 23m dose for sleep.  I have faxed your rx for this to [facility] as requested.  Change the remeron to 320mdose, this has been sent to your pharmacy.  Follow up with usKorean one month.  Thank you for choosing me and [facility].  CaGatha MayerM.D., FAQuad City Endoscopy LLC

## 2012-04-12 NOTE — Progress Notes (Signed)
  Subjective:    Patient ID: [name], male    DOB: 06-22-1965, 47 y.o.   MRN: [id number]  HPI [name] returns after a hospitalization for weakness, diarrhea and abdominal pain revealed active Crohn's colitis, chronic and active mesenteric ischemia, and depression. He was evaluated by vascular surgery. He was started on mirtazipine for depression. He was on enteric nutrition but then began to eat well enough such that  He did not require nasoenteric tube nutrition.  He is improved but "not there yet".  Has some lower extremity edema. Feels ok in Am but as day goes on is fatigued and gets a funny feeling in his head - not dizzy or light-headed but hard for him to explain. Does want to live. Is sleeping better but trying different combinations of alprazolam and Ambien to help but still has interrupted sleep. Fearful of tapering prednisone at this time. Some formed stools, some loose but not bleeding and less frequent. Says no abdominal pain. He is gaining weight. Using Boost plus twice a day at least.  Medications, allergies, past medical history, past surgical history, family history and social history are reviewed and updated in the EMR.   Review of Systems As above    Objective:   Physical Exam General:  NAD but chronically ill and asthenic Eyes:   anicteric Lungs:  clear Heart:  S1S2 no rubs, murmurs or gallops, no carotid bruits Abdomen:  soft and nontender, BS+, + midline bruit Ext:   1+ feet to lower pretibial edema    Data Reviewed:  Hospital progress notes, DC summary, CT angio, arteriogram, labs     Assessment & Plan:   1. Crohn's disease of small and large intestines with PSC/AIH overlap  2. Chronic mesenteric ischemia   3. Severe protein-calorie malnutrition   4. Major depressive disorder, recurrent episode, severe, without mention of psychotic behavior ? Some OCD issues   1. Overall is better but still early in therapy 2. For Crohn's will continue current  prednisone dose of 30 mg daily - try to go to 20 mg or 25 mg daily soon - will call him, continue Humira 3. Continue and increase mirtazipine to 30 mg nightly 4. Use alprazolam 29m qhs and try to avoid adding Ambien 5. REV 1 month 6. Labs today (see below) 7. Ultimately plan for revascularization when nutritional status and prednisone dose acceptable to Dr. FOneida Alar8. Explained that edema is from low albumin - observe, elevate and wait, no diuretics 9. Continue Home PT, see if he can get lab draws at home  CC: LKathyrn Lass MD ad CRuta Hinds MD

## 2012-04-12 NOTE — Progress Notes (Signed)
Quick Note:  Labs are looking better Have him stop kphos when it runs out  Repeat CMET and phosphorus in 3 weeks - I think he is homebound overall and would like to order home health to draw if we can get that done ______

## 2012-04-13 ENCOUNTER — Encounter: Payer: Self-pay | Admitting: Internal Medicine

## 2012-04-16 ENCOUNTER — Encounter: Payer: Self-pay | Admitting: Internal Medicine

## 2012-04-17 ENCOUNTER — Other Ambulatory Visit: Payer: Self-pay

## 2012-04-17 DIAGNOSIS — F3289 Other specified depressive episodes: Secondary | ICD-10-CM | POA: Diagnosis not present

## 2012-04-17 DIAGNOSIS — F329 Major depressive disorder, single episode, unspecified: Secondary | ICD-10-CM | POA: Diagnosis not present

## 2012-04-17 DIAGNOSIS — K921 Melena: Secondary | ICD-10-CM | POA: Diagnosis not present

## 2012-04-17 DIAGNOSIS — K5090 Crohn's disease, unspecified, without complications: Secondary | ICD-10-CM | POA: Diagnosis not present

## 2012-04-17 DIAGNOSIS — IMO0001 Reserved for inherently not codable concepts without codable children: Secondary | ICD-10-CM | POA: Diagnosis not present

## 2012-04-17 DIAGNOSIS — E46 Unspecified protein-calorie malnutrition: Secondary | ICD-10-CM | POA: Diagnosis not present

## 2012-04-17 DIAGNOSIS — K5289 Other specified noninfective gastroenteritis and colitis: Secondary | ICD-10-CM | POA: Diagnosis not present

## 2012-04-18 ENCOUNTER — Ambulatory Visit: Payer: Medicare Other | Admitting: Internal Medicine

## 2012-04-19 ENCOUNTER — Telehealth: Payer: Self-pay | Admitting: Internal Medicine

## 2012-04-19 DIAGNOSIS — IMO0001 Reserved for inherently not codable concepts without codable children: Secondary | ICD-10-CM | POA: Diagnosis not present

## 2012-04-19 DIAGNOSIS — K5289 Other specified noninfective gastroenteritis and colitis: Secondary | ICD-10-CM | POA: Diagnosis not present

## 2012-04-19 DIAGNOSIS — E46 Unspecified protein-calorie malnutrition: Secondary | ICD-10-CM | POA: Diagnosis not present

## 2012-04-19 DIAGNOSIS — K5090 Crohn's disease, unspecified, without complications: Secondary | ICD-10-CM | POA: Diagnosis not present

## 2012-04-19 DIAGNOSIS — F329 Major depressive disorder, single episode, unspecified: Secondary | ICD-10-CM | POA: Diagnosis not present

## 2012-04-19 DIAGNOSIS — K921 Melena: Secondary | ICD-10-CM | POA: Diagnosis not present

## 2012-04-19 DIAGNOSIS — F3289 Other specified depressive episodes: Secondary | ICD-10-CM | POA: Diagnosis not present

## 2012-04-19 NOTE — Telephone Encounter (Signed)
I have faxed orders to the above number and left a message for her to call back for any questions

## 2012-04-24 ENCOUNTER — Encounter: Payer: Self-pay | Admitting: Internal Medicine

## 2012-04-25 DIAGNOSIS — F329 Major depressive disorder, single episode, unspecified: Secondary | ICD-10-CM | POA: Diagnosis not present

## 2012-04-25 DIAGNOSIS — E46 Unspecified protein-calorie malnutrition: Secondary | ICD-10-CM | POA: Diagnosis not present

## 2012-04-25 DIAGNOSIS — K921 Melena: Secondary | ICD-10-CM | POA: Diagnosis not present

## 2012-04-25 DIAGNOSIS — IMO0001 Reserved for inherently not codable concepts without codable children: Secondary | ICD-10-CM | POA: Diagnosis not present

## 2012-04-25 DIAGNOSIS — K5289 Other specified noninfective gastroenteritis and colitis: Secondary | ICD-10-CM | POA: Diagnosis not present

## 2012-04-25 DIAGNOSIS — F3289 Other specified depressive episodes: Secondary | ICD-10-CM | POA: Diagnosis not present

## 2012-04-25 DIAGNOSIS — K5090 Crohn's disease, unspecified, without complications: Secondary | ICD-10-CM | POA: Diagnosis not present

## 2012-04-27 DIAGNOSIS — F329 Major depressive disorder, single episode, unspecified: Secondary | ICD-10-CM | POA: Diagnosis not present

## 2012-04-27 DIAGNOSIS — K5289 Other specified noninfective gastroenteritis and colitis: Secondary | ICD-10-CM | POA: Diagnosis not present

## 2012-04-27 DIAGNOSIS — K5090 Crohn's disease, unspecified, without complications: Secondary | ICD-10-CM | POA: Diagnosis not present

## 2012-04-27 DIAGNOSIS — F3289 Other specified depressive episodes: Secondary | ICD-10-CM | POA: Diagnosis not present

## 2012-04-27 DIAGNOSIS — E46 Unspecified protein-calorie malnutrition: Secondary | ICD-10-CM | POA: Diagnosis not present

## 2012-04-27 DIAGNOSIS — IMO0001 Reserved for inherently not codable concepts without codable children: Secondary | ICD-10-CM | POA: Diagnosis not present

## 2012-04-27 DIAGNOSIS — K921 Melena: Secondary | ICD-10-CM | POA: Diagnosis not present

## 2012-04-28 ENCOUNTER — Ambulatory Visit (INDEPENDENT_AMBULATORY_CARE_PROVIDER_SITE_OTHER): Payer: Medicare Other

## 2012-04-28 ENCOUNTER — Ambulatory Visit (INDEPENDENT_AMBULATORY_CARE_PROVIDER_SITE_OTHER): Payer: Medicare Other | Admitting: Internal Medicine

## 2012-04-28 ENCOUNTER — Encounter: Payer: Self-pay | Admitting: Internal Medicine

## 2012-04-28 ENCOUNTER — Other Ambulatory Visit: Payer: Medicare Other

## 2012-04-28 ENCOUNTER — Other Ambulatory Visit: Payer: Self-pay | Admitting: Internal Medicine

## 2012-04-28 VITALS — BP 120/72 | HR 94 | Ht 73.0 in | Wt 133.0 lb

## 2012-04-28 DIAGNOSIS — E876 Hypokalemia: Secondary | ICD-10-CM

## 2012-04-28 DIAGNOSIS — F332 Major depressive disorder, recurrent severe without psychotic features: Secondary | ICD-10-CM

## 2012-04-28 DIAGNOSIS — K5080 Crohn's disease of both small and large intestine without complications: Secondary | ICD-10-CM

## 2012-04-28 DIAGNOSIS — E43 Unspecified severe protein-calorie malnutrition: Secondary | ICD-10-CM

## 2012-04-28 DIAGNOSIS — K5090 Crohn's disease, unspecified, without complications: Secondary | ICD-10-CM

## 2012-04-28 DIAGNOSIS — K551 Chronic vascular disorders of intestine: Secondary | ICD-10-CM

## 2012-04-28 LAB — BASIC METABOLIC PANEL
BUN: 13 mg/dL (ref 6–23)
GFR: 100.47 mL/min (ref >60.00)
Potassium: 3.1 mEq/L — ABNORMAL LOW (ref 3.5–5.1)
Sodium: 136 mEq/L (ref 135–145)

## 2012-04-28 LAB — PHOSPHORUS: Phosphorus: 3 mg/dL (ref 2.3–4.6)

## 2012-04-28 MED ORDER — HYDROCORTISONE 2.5 % RE CREA: TOPICAL_CREAM | Freq: Two times a day (BID) | RECTAL | Status: DC

## 2012-04-28 MED ORDER — ESCITALOPRAM OXALATE 20 MG PO TABS: 20.0000 mg | ORAL_TABLET | Freq: Every day | ORAL | Status: DC

## 2012-04-28 MED ORDER — POTASSIUM CHLORIDE CRYS ER 20 MEQ PO TBCR: 20.0000 meq | EXTENDED_RELEASE_TABLET | Freq: Two times a day (BID) | ORAL | Status: DC

## 2012-04-28 MED ORDER — ZOLPIDEM TARTRATE 10 MG PO TABS: 10.0000 mg | ORAL_TABLET | Freq: Every evening | ORAL | Status: DC | PRN

## 2012-04-28 NOTE — Progress Notes (Unsigned)
Spoke to patient and informed him to call Dr. Apolonio Schneiders for an appointment at [phone] per [name], he wrote the information down and will call them Monday he said.  Order put in epic.

## 2012-04-28 NOTE — Patient Instructions (Addendum)
We have sent  medications to your pharmacy for you to pick up at your convenience.  Please go to the lab before leaving today and get your labs drawn.  Use a telfa pad along with triple antibiotic ointment on your tailbone area.  We will call you back with your appointment with Dr. Letta Moynahan.    Thank you for choosing [facility] for your health care needs!   [name]

## 2012-04-28 NOTE — Progress Notes (Signed)
Subjective:    Patient ID: [name], male    DOB: Mar 16, 1965, 47 y.o.   MRN: [id number]  HPI [name] returns with his parents. Dep[ression worsening in past week and still struggling to sleep - even with 3 alprazolam.  Not suicidal Less abdominal pain but still having diarrhea.Hemorrhoid swollen and skin breakdown area on pre-sacrum (was there after hospital, actually better) Still taking Boost supplements  Unwilling to be hospitalized. Wt Readings from Last 3 Encounters:  04/28/12 133 lb (60.328 kg)  04/12/12 142 lb 9.6 oz (64.683 kg)  04/05/12 136 lb 11 oz (62 kg)   Allergies  Allergen Reactions  . Asacol (Mesalamine)   . Azathioprine   . Mycophenolate Mofetil   . Other     NUTS  . Protein     Agent: Additives and preservatives  . Wheat Bran    Outpatient Prescriptions Prior to Visit  Medication Sig Dispense Refill  . adalimumab (HUMIRA) 40 MG/0.8ML injection Inject 0.8 mLs (40 mg total) into the skin every 14 (fourteen) days.  2 each  4  . mirtazapine (REMERON) 30 MG tablet Take 1 tablet (30 mg total) by mouth at bedtime.  30 tablet  3  . predniSONE (DELTASONE) 20 MG tablet Take 15 mg by mouth 2 (two) times daily. Take as directed      . ALPRAZolam (XANAX) 1 MG tablet Take 2 tablets (2 mg total) by mouth at bedtime as needed for sleep (may use 1 if helps adequately).  60 tablet  3  . zolpidem (AMBIEN) 5 MG tablet Take 1 tablet (5 mg total) by mouth at bedtime as needed for sleep.  30 tablet  1  . adalimumab (HUMIRA PEN) 40 MG/0.8ML injection Inject one pen sq every other week after the completion of the starter pack  2 each  6  . adalimumab (HUMIRA) 40 MG/0.8ML injection Inject 40 mg into the vein every 14 (fourteen) days. Inject one pen sq every other week after the completion of the starter pack      . feeding supplement (ENSURE COMPLETE) LIQD Take 237 mLs by mouth 2 (two) times daily between meals.  876 Bottle  1  . folic acid (FOLVITE) 1 MG tablet Take 1 tablet  (1 mg total) by mouth daily.  30 tablet  0  . potassium phosphate, monobasic, (K-PHOS ORIGINAL) 500 MG tablet Take 1 tablet (500 mg total) by mouth 4 (four) times daily -  with meals and at bedtime.  90 tablet  1   No facility-administered medications prior to visit.   Past Medical History  Diagnosis Date  . Crohn's disease of small and large intestines   . Hypercholesterolemia   . History of alcohol abuse   . History of substance abuse   . Depression   . 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 Primary sclerosing cholangitis     ? hepatitis overlap - liver bx x 2 and MRCP  . Allergy     SEASONAL  . Anxiety   . Cataract     BILATERAL/REMOVED  . Hypertension   . Personal history of adenomatous colonic polyps 12/2010, 03/2012    12/2010 - 8 mm serrated adenoma of rectum  . Chronic mesenteric ischemia   . Basal cell carcinoma of lower extremity 06/2011    on left leg  . Dysplastic nevus 06/2011    on right back.    Past Surgical History  Procedure Laterality Date  . Cataract extraction, bilateral    .  Pylonidal cyst removal    . Foot surgery      right  . Percutaneous liver biopsy  2007 and 2008  . Colonoscopy  2001, 05/02/2003, 01/28/11    2012: Right colon Crohn's, rectal polyp  . Esophagogastroduodenoscopy  01/28/11    Normal  . Colonoscopy  03/31/2012    Procedure: COLONOSCOPY;  Surgeon: Jerene Bears, MD;  Location: [facility];  Service: Gastroenterology;  Laterality: N/A;   History   Social History  . Marital Status: Single    Spouse Name: N/A    Number of Children: 1  . Years of Education: N/A   Occupational History  . Disability     Social History Main Topics  . Smoking status: Former Smoker    Types: Cigarettes    Quit date: 03/28/1997  . Smokeless tobacco: Never Used  . Alcohol Use: No  . Drug Use: No     Comment: QUIT USING DRUGS IN 1997  . Sexually Active: None   Other Topics Concern  . None   Social History Narrative  . None   Family  History  Problem Relation Age of Onset  . Heart disease Father   . Thyroid cancer Father   . Breast cancer Mother   . Stomach cancer Mother   . Colon cancer Neg Hx      Review of Systems As above    Objective:   Physical Exam General:  NAD but gaunt and chronically ill Eyes:   anicteric Lungs:  clear  Heart:  S1S2 no rubs, murmurs or gallops Abdomen:  soft and nontender, BS+, scaphoid, no bruit today Ext:   no edema Skin:   Slightly decreased turgor, slit like skin breakdown over sacral -coccyx Rectal:  Inspection shows a swollen hemorrhoid     Assessment & Plan:  Major depressive disorder, recurrent episode, severe, without mention of psychotic behavior - Plan: escitalopram (LEXAPRO) 20 MG tablet, Ambulatory referral to Psychiatry  Crohn's disease of small and large intestines  Chronic mesenteric ischemia  Severe protein-calorie malnutrition  He has deteriorated some - I think mostly from the depression and anorexia but is multifactorial.  1. Start escitalopram 10 mg daily and then up to 20 mg in 1 week 2. Ambien 10 mg and 1-2 alprazolam for sleep 3. Psychiatry referral - will see timing of this, I think will add psychologist now also though did not say that in visit thinking about psychiatrist. 4. He does not want to see Dr. Oneida Alar until closer to surgery status - no way he is appropriate for that presently 5. REV 1 month, will call him next week also 6. Proctozone HC 2.5% 7. Resume Telfa pad and antibiotic ointment to sacral decubitus - parents report this is better  VW:[id number] Antony Haste, MD Ruta Hinds, MD, Letta Moynahan, MD

## 2012-04-28 NOTE — Progress Notes (Signed)
Quick Note:  I called him [name] start KCL 20 Meq 2 times a day - Rx sent  I also want to refer him To Dr. Cheryln Manly for psychotherapy while we wait for psychiatry appointment.   ______

## 2012-04-30 ENCOUNTER — Encounter: Payer: Self-pay | Admitting: Internal Medicine

## 2012-05-01 ENCOUNTER — Telehealth: Payer: Self-pay

## 2012-05-01 NOTE — Telephone Encounter (Signed)
Joy with Dr. Tomasita Crumble [facility]'s office called to let me know they do not file Medicare and the patient is not allowed to either if seen there.  I spoke with Mr. Reihl and he was ok with that and will pay for his visits.  I called Joy back and informed her that he would like to proceed with an appointment , Caryl Asp is going to call him and set this up.

## 2012-05-02 ENCOUNTER — Encounter: Payer: Self-pay | Admitting: Internal Medicine

## 2012-05-02 NOTE — Telephone Encounter (Signed)
See message from [facility]

## 2012-05-04 ENCOUNTER — Other Ambulatory Visit: Payer: Medicare Other

## 2012-05-04 ENCOUNTER — Ambulatory Visit: Payer: Medicare Other | Admitting: Vascular Surgery

## 2012-05-04 ENCOUNTER — Encounter: Payer: Self-pay | Admitting: Internal Medicine

## 2012-05-04 NOTE — Telephone Encounter (Signed)
[name], do you have any suggestions?

## 2012-05-04 NOTE — Progress Notes (Signed)
Quick Note:  Please have him see me in late March/early April  BMET and phosphorus in mid March please ______

## 2012-05-05 ENCOUNTER — Other Ambulatory Visit: Payer: Self-pay | Admitting: Internal Medicine

## 2012-05-05 DIAGNOSIS — K5090 Crohn's disease, unspecified, without complications: Secondary | ICD-10-CM

## 2012-05-11 ENCOUNTER — Encounter: Payer: Self-pay | Admitting: Internal Medicine

## 2012-05-12 ENCOUNTER — Telehealth: Payer: Self-pay

## 2012-05-12 NOTE — Progress Notes (Signed)
Spoke to patient and set up his follow up appointment for 06/02/12.  Also reminded him to come for his lab work. Patient informed me that he has Dr. Cheryln Manly appointment on 05/15/12.  I will let [name] know.

## 2012-05-12 NOTE — Telephone Encounter (Signed)
Message copied by [place], Adaliz Dobis E on Fri May 12, 2012  4:35 PM ------      Message from: Silvano Rusk E      Created: Thu May 04, 2012  8:32 PM       Please have him see me in late March/early April            BMET and phosphorus in mid March please ------

## 2012-05-12 NOTE — Telephone Encounter (Signed)
Spoke to patient and made f/u appointment with [name] for 06/02/2012.  Reminded him to come and get labs drawn soon, orders in.  He informed me that he has Dr. Cheryln Manly appointment on 05/15/12.

## 2012-05-15 ENCOUNTER — Ambulatory Visit: Payer: Medicare Other | Admitting: Psychology

## 2012-05-15 ENCOUNTER — Emergency Department (HOSPITAL_COMMUNITY)
Admission: EM | Admit: 2012-05-15 | Discharge: 2012-05-15 | Disposition: A | Discharge status: Home / Self Care | Payer: Medicare Other | Source: Home / Self Care | Attending: Emergency Medicine | Admitting: Emergency Medicine

## 2012-05-15 ENCOUNTER — Encounter (HOSPITAL_COMMUNITY): Payer: Self-pay | Admitting: Emergency Medicine

## 2012-05-15 DIAGNOSIS — T887XXA Unspecified adverse effect of drug or medicament, initial encounter: Secondary | ICD-10-CM | POA: Diagnosis not present

## 2012-05-15 DIAGNOSIS — T50905A Adverse effect of unspecified drugs, medicaments and biological substances, initial encounter: Secondary | ICD-10-CM

## 2012-05-15 LAB — COMPREHENSIVE METABOLIC PANEL
AST: 39 U/L — ABNORMAL HIGH (ref 0–37)
CO2: 31 mEq/L (ref 19–32)
Chloride: 97 mEq/L (ref 96–112)
Creatinine, Ser: 0.87 mg/dL (ref 0.50–1.35)
GFR calc non Af Amer: >90 mL/min (ref >90)
Total Bilirubin: 0.3 mg/dL (ref 0.3–1.2)

## 2012-05-15 LAB — CBC WITH DIFFERENTIAL/PLATELET
Basophils Absolute: 0 10*3/uL (ref 0.0–0.1)
HCT: 42.2 % (ref 39.0–52.0)
Hemoglobin: 13.9 g/dL (ref 13.0–17.0)
Lymphocytes Relative: 8 % — ABNORMAL LOW (ref 12–46)
Lymphs Abs: 1.2 10*3/uL (ref 0.7–4.0)
Monocytes Absolute: 1.3 10*3/uL — ABNORMAL HIGH (ref 0.1–1.0)
Monocytes Relative: 8 % (ref 3–12)
Neutro Abs: 12.3 10*3/uL — ABNORMAL HIGH (ref 1.7–7.7)
RBC: 4.28 MIL/uL (ref 4.22–5.81)
WBC: 14.9 10*3/uL — ABNORMAL HIGH (ref 4.0–10.5)

## 2012-05-15 NOTE — ED Notes (Signed)
Per pt family pt was recently discharged for medical problems. Pt's father sts that he noticed pt was sleeping longer than normal. Pt sts that he has only been taking his normal medication as prescribed. Pt's father assist with the care for pt and he is unable to account for multiple xanax pills. Pt has hx of depression and is taking his medication as prescribed. Pt denies any SI/HI or depression while in triage. Mother, father and patient all state that there is no concern for OD or any pysch concerns as this is possibly just an accident of taking to many pills. Pt is a/ox4 in triage.

## 2012-05-15 NOTE — ED Provider Notes (Signed)
History     CSN: [id number]  Arrival date & time 05/15/12  1648   First MD Initiated Contact with Patient 05/15/12 1847      Chief Complaint  Patient presents with  . Drug Overdose    (Consider location/radiation/quality/duration/timing/severity/associated sxs/prior treatment) HPI Comments: [name] is a 47 y.o. Male who states that he awoke this morning and felt "out of it." He does not know why. His family members think that he took too many Xanax; I counted his pill bottle and he is missing 56. The patient denies using the medications inappropriately. He saw his psychiatrist yesterday. His psychiatrist is working on assuming his  Treatment, and planning on tapering it, then stopping it. The patient is currently living with his parents, after a Crohn's flare, 2 months ago. He denies headache, weakness, dizziness, nausea, vomiting, paresthesias. There are no known modifying factors  Patient is a 47 y.o. male presenting with Overdose. The history is provided by the patient.  Drug Overdose    Past Medical History  Diagnosis Date  . Crohn's disease of small and large intestines   . Hypercholesterolemia   . History of alcohol abuse   . History of substance abuse   . Depression   . 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 Primary sclerosing cholangitis     ? hepatitis overlap - liver bx x 2 and MRCP  . Allergy     SEASONAL  . Anxiety   . Cataract     BILATERAL/REMOVED  . Hypertension   . Personal history of adenomatous colonic polyps 12/2010, 03/2012    12/2010 - 8 mm serrated adenoma of rectum  . Chronic mesenteric ischemia   . Basal cell carcinoma of lower extremity 06/2011    on left leg  . Dysplastic nevus 06/2011    on right back.     Past Surgical History  Procedure Laterality Date  . Cataract extraction, bilateral    . Pylonidal cyst removal    . Foot surgery      right  . Percutaneous liver biopsy  2007 and 2008  . Colonoscopy  2001, 05/02/2003,  01/28/11    2012: Right colon Crohn's, rectal polyp  . Esophagogastroduodenoscopy  01/28/11    Normal  . Colonoscopy  03/31/2012    Procedure: COLONOSCOPY;  Surgeon: Jerene Bears, MD;  Location: [facility] ENDOSCOPY;  Service: Gastroenterology;  Laterality: N/A;    Family History  Problem Relation Age of Onset  . Heart disease Father   . Thyroid cancer Father   . Breast cancer Mother   . Stomach cancer Mother   . Colon cancer Neg Hx     History  Substance Use Topics  . Smoking status: Former Smoker    Types: Cigarettes    Quit date: 03/28/1997  . Smokeless tobacco: Never Used  . Alcohol Use: No      Review of Systems  All other systems reviewed and are negative.    Allergies  Asacol; Azathioprine; Mycophenolate mofetil; Other; Protein; and Wheat bran  Home Medications   Current Outpatient Rx  Name  Route  Sig  Dispense  Refill  . ALPRAZolam (XANAX) 1 MG tablet   Oral   Take 0.5 mg by mouth at bedtime as needed for sleep (may use 1 if helps adequately).          Marland Kitchen escitalopram (LEXAPRO) 20 MG tablet   Oral   Take 15 mg by mouth daily. Start with a  half-tablet first week         . hydrocortisone (PROCTOSOL HC) 2.5 % rectal cream   Rectal   Place rectally 2 (two) times daily.   30 g   0   . mirtazapine (REMERON) 45 MG tablet   Oral   Take 45 mg by mouth at bedtime.         . potassium chloride SA (K-DUR,KLOR-CON) 20 MEQ tablet   Oral   Take 1 tablet (20 mEq total) by mouth 2 (two) times daily.   60 tablet   2   . predniSONE (DELTASONE) 20 MG tablet   Oral   Take 15 mg by mouth 2 (two) times daily. Take as directed         . zolpidem (AMBIEN) 10 MG tablet   Oral   Take 1 tablet (10 mg total) by mouth at bedtime as needed for sleep.   30 tablet   5   . adalimumab (HUMIRA) 40 MG/0.8ML injection   Subcutaneous   Inject 0.8 mLs (40 mg total) into the skin every 14 (fourteen) days.   2 each   4     BP 139/98  Pulse 96  Temp(Src) 98 F (36.7  C) (Oral)  Resp 12  SpO2 96%  Physical Exam  Nursing note and vitals reviewed. Constitutional: He is oriented to person, place, and time. He appears well-developed and well-nourished.  HENT:  Head: Normocephalic and atraumatic.  Right Ear: External ear normal.  Left Ear: External ear normal.  Eyes: Conjunctivae and EOM are normal. Pupils are equal, round, and reactive to light.  Neck: Normal range of motion and phonation normal. Neck supple.  Cardiovascular: Normal rate, regular rhythm, normal heart sounds and intact distal pulses.   Pulmonary/Chest: Effort normal and breath sounds normal. He exhibits no bony tenderness.  Abdominal: Soft. Normal appearance. There is no tenderness.  Musculoskeletal: Normal range of motion.  Neurological: He is alert and oriented to person, place, and time. He has normal strength. No cranial nerve deficit or sensory deficit. He exhibits normal muscle tone. Coordination normal.  Skin: Skin is warm, dry and intact.  Psychiatric: His behavior is normal. Judgment and thought content normal.  He appears depressed    ED Course  Procedures (including critical care time)  Discussion with patient and family. Family will assume his dosing of Xanax. The patient is comfortable with this plan. He denies suicidal ideation.    Labs Reviewed  CBC WITH DIFFERENTIAL - Abnormal; Notable for the following:    WBC 14.9 (*)    Neutrophils Relative 83 (*)    Neutro Abs 12.3 (*)    Lymphocytes Relative 8 (*)    Monocytes Absolute 1.3 (*)    All other components within normal limits  COMPREHENSIVE METABOLIC PANEL - Abnormal; Notable for the following:    Sodium 134 (*)    Glucose, Bld 146 (*)    Albumin 2.7 (*)    AST 39 (*)    Alkaline Phosphatase 181 (*)    All other components within normal limits   Nursing Notes Reviewed/ Care Coordinated, and agree without changes. Applicable Imaging Reviewed Interpretation of Laboratory Data incorporated into ED  treatment   1. Adverse effects of medication, initial encounter       MDM  Sedation related to benzodiazepine abuse. No suicidal gesture or action. Patient is in a supportive and caring environment.Doubt metabolic instability, serious bacterial infection or impending vascular collapse; the patient is stable for discharge.  Plan: Home Medications- usual, gradually re-introduce Xanax in the next 12-16 hours  ; Home Treatments- Push fluids and nutrition; Recommended follow up- PCP prn      Richarda Blade, MD 05/15/12 2354

## 2012-05-16 ENCOUNTER — Telehealth: Payer: Self-pay | Admitting: Internal Medicine

## 2012-05-16 NOTE — Telephone Encounter (Signed)
I spoke to [name] and his Mom today. He had taken an Ambien and thought is he got confused and took too many alprazolam. He was evaluated and released by the ED. I have advised that he/mom call Dr. Arvil Persons office and let them know what happended - in case needs to be seen sooner.  Advice re: tapering alprazolam given also - reducing daily dose by 1 a day over 3-5 days each.  Mom is in control of meds now.

## 2012-05-17 ENCOUNTER — Telehealth: Payer: Self-pay | Admitting: Internal Medicine

## 2012-05-17 ENCOUNTER — Ambulatory Visit (HOSPITAL_COMMUNITY)
Admission: RE | Admit: 2012-05-17 | Discharge: 2012-05-17 | Disposition: A | Discharge status: Home / Self Care | Payer: Medicare Other | Source: Home / Self Care | Attending: Psychiatry | Admitting: Psychiatry

## 2012-05-17 DIAGNOSIS — F329 Major depressive disorder, single episode, unspecified: Secondary | ICD-10-CM | POA: Insufficient documentation

## 2012-05-17 DIAGNOSIS — F411 Generalized anxiety disorder: Secondary | ICD-10-CM | POA: Insufficient documentation

## 2012-05-17 DIAGNOSIS — F3289 Other specified depressive episodes: Secondary | ICD-10-CM | POA: Insufficient documentation

## 2012-05-17 NOTE — Telephone Encounter (Signed)
[name] notified.

## 2012-05-17 NOTE — BH Assessment (Signed)
Assessment Note   [name] is an 47 y.o. male. PT PRESENTED TO [facility] FOR PSYCH-IOP TO GET HELP WITH HIS DEPRESSION AND ANXIETY.  HE WAS DIAGNOSED WITH CROHN'S DISEASE 2 YEARS AGO AND HAD DEPRESSION AND ANXIETY SINCE THEN HIS ANXIETY HAS WORSEN OVER THE PAST FEW MONTHS AND IS THEREFORE BEING TAPERED OFF HIS PREDNISONE BY HIS PCP AND WAS RECENTLY PRESCRIBED HUMIRA.  PT IS SAD DENIES S/I BUT FEELS HE A BURDEN TO HIS PARENTS.  HE DENIES H/I AND IS NOT PSYCHOTIC. HE REPORTS ALWAYS FEARFUL OF THINGS, THINKS HE LETTING EVERY BODY DOWN, UNABLE TO SLEEP MORE THAN 3 HOURS PER NIGHT EVEN WITH MEDICATIONS AND NO DAY TIME SLEEPING.  HE HAS POOR MEMORY AND IS UNABLE TO CONCENTRATE. PT APPEARED HOPELESS AND HELPLESS.  HE  AGREES TO ATTEND THE PSYCH-IOP PROGRAM STARTING May 23, 2012. HE WAS GIVEN SUICIDE PREVENTION INFORMATION. NO MSE NECESSARY DUE TO PSYCH IOP APPOINTMENT.  Axis I: Anxiety Disorder NOS and Depressive Disorder NOS Axis II: Deferred Axis III:  Past Medical History  Diagnosis Date  . Crohn's disease of small and large intestines   . Hypercholesterolemia   . History of alcohol abuse   . History of substance abuse   . Depression   . 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 Primary sclerosing cholangitis     ? hepatitis overlap - liver bx x 2 and MRCP  . Allergy     SEASONAL  . Anxiety   . Cataract     BILATERAL/REMOVED  . Hypertension   . Personal history of adenomatous colonic polyps 12/2010, 03/2012    12/2010 - 8 mm serrated adenoma of rectum  . Chronic mesenteric ischemia   . Basal cell carcinoma of lower extremity 06/2011    on left leg  . Dysplastic nevus 06/2011    on right back.    Axis IV: problems related to legal system/crime, problems related to social environment and problems with access to health care services Axis V: 41-50 serious symptoms  Past Medical History:  Past Medical History  Diagnosis Date  . Crohn's disease of small and large intestines   .  Hypercholesterolemia   . History of alcohol abuse   . History of substance abuse   . Depression   . 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 Primary sclerosing cholangitis     ? hepatitis overlap - liver bx x 2 and MRCP  . Allergy     SEASONAL  . Anxiety   . Cataract     BILATERAL/REMOVED  . Hypertension   . Personal history of adenomatous colonic polyps 12/2010, 03/2012    12/2010 - 8 mm serrated adenoma of rectum  . Chronic mesenteric ischemia   . Basal cell carcinoma of lower extremity 06/2011    on left leg  . Dysplastic nevus 06/2011    on right back.     Past Surgical History  Procedure Laterality Date  . Cataract extraction, bilateral    . Pylonidal cyst removal    . Foot surgery      right  . Percutaneous liver biopsy  2007 and 2008  . Colonoscopy  2001, 05/02/2003, 01/28/11    2012: Right colon Crohn's, rectal polyp  . Esophagogastroduodenoscopy  01/28/11    Normal  . Colonoscopy  03/31/2012    Procedure: COLONOSCOPY;  Surgeon: Jerene Bears, MD;  Location: [facility] ENDOSCOPY;  Service: Gastroenterology;  Laterality: N/A;    Family  History:  Family History  Problem Relation Age of Onset  . Heart disease Father   . Thyroid cancer Father   . Breast cancer Mother   . Stomach cancer Mother   . Colon cancer Neg Hx     Social History:  reports that he quit smoking about 15 years ago. His smoking use included Cigarettes. He smoked 0.00 packs per day. He has never used smokeless tobacco. He reports that he does not drink alcohol or use illicit drugs.  Additional Social History:  Alcohol / Drug Use Pain Medications: na Prescriptions: na Over the Counter: na History of alcohol / drug use?: No history of alcohol / drug abuse  CIWA:   COWS:    Allergies:  Allergies  Allergen Reactions  . Asacol (Mesalamine)   . Azathioprine   . Mycophenolate Mofetil   . Other     NUTS  . Protein     Agent: Additives and preservatives  . Wheat Bran     Home Medications:  (Not  in a hospital admission)  OB/GYN Status:  No LMP for male patient.  General Assessment Data Location of Assessment: [facility] Living Arrangements: Alone Can pt return to current living arrangement?: Yes Admission Status: Voluntary Is patient capable of signing voluntary admission?: Yes Transfer from: [facility] Referral Source: Self/Family/Friend  Education Status Contact person: [place] to self Suicidal Ideation: No Suicidal Intent: No Is patient at risk for suicide?: No Suicidal Plan?: No Specify Current Suicidal Plan: na Access to Means: No What has been your use of drugs/alcohol within the last 12 months?: none Previous Attempts/Gestures: No How many times?: 0 Other Self Harm Risks: na Triggers for Past Attempts: None known Intentional Self Injurious Behavior: None Family Suicide History: No Recent stressful life event(s): Recent negative physical changes Persecutory voices/beliefs?: No Depression: Yes Depression Symptoms: Despondent;Insomnia;Isolating;Fatigue;Loss of interest in usual pleasures;Feeling worthless/self pity Substance abuse history and/or treatment for substance abuse?: No Suicide prevention information given to non-admitted patients: Yes  Risk to Others Homicidal Ideation: No Thoughts of Harm to Others: No Current Homicidal Intent: No Current Homicidal Plan: No Access to Homicidal Means: No Identified Victim: na History of harm to others?: No Assessment of Violence: None Noted Violent Behavior Description: na Does patient have access to weapons?: No Criminal Charges Pending?: No Does patient have a court date: No  Psychosis Hallucinations: None noted Delusions: None noted  Mental Status Report Appear/Hygiene: Improved Eye Contact: Fair Motor Activity: Freedom of movement;Restlessness Speech: Logical/coherent;Slow;Soft Level of Consciousness: Alert;Restless Mood:  Depressed;Helpless;Sad;Elated;Despair;Worthless, low self-esteem Affect: Appropriate to circumstance;Depressed;Sad;Anxious Anxiety Level: Moderate Thought Processes: Coherent;Relevant Judgement: Unimpaired Orientation: Person;Place;Time;Situation Obsessive Compulsive Thoughts/Behaviors: Minimal  Cognitive Functioning Concentration: Decreased Memory: Recent Intact;Remote Intact IQ: Average Insight: Fair Impulse Control: Fair Appetite: Fair Weight Loss: 0 Weight Gain: 0 Sleep: Decreased Total Hours of Sleep: 3 Vegetative Symptoms: Decreased grooming  ADLScreening [facility] Assessment Services) Patient's cognitive ability adequate to safely complete daily activities?: Yes Patient able to express need for assistance with ADLs?: Yes Independently performs ADLs?: Yes (appropriate for developmental age)  Abuse/Neglect [facility]) Physical Abuse: Denies Verbal Abuse: Denies Sexual Abuse: Denies  Prior Inpatient Therapy Prior Inpatient Therapy: No Prior Therapy Dates: na Prior Therapy Facilty/Provider(s): na Reason for Treatment: na  Prior Outpatient Therapy Prior Outpatient Therapy: Yes Prior Therapy Dates: currently 2 weeks ago first visit Prior Therapy Facilty/Provider(s): forgot name of psychiatrist Reason for Treatment: anxiety  ADL Screening (condition at time of admission) Patient's cognitive ability adequate to safely complete  daily activities?: Yes Patient able to express need for assistance with ADLs?: Yes Independently performs ADLs?: Yes (appropriate for developmental age) Weakness of Legs: Both Weakness of Arms/Hands: Both     Therapy Consults (therapy consults require a physician order) PT Evaluation Needed: No OT Evalulation Needed: No SLP Evaluation Needed: No Abuse/Neglect Assessment (Assessment to be complete while patient is alone) Physical Abuse: Denies Verbal Abuse: Denies Sexual Abuse: Denies Exploitation of patient/patient's resources:  Denies Self-Neglect: Denies Values / Beliefs Cultural Requests During Hospitalization: None Spiritual Requests During Hospitalization: None Consults Spiritual Care Consult Needed: No Social Work Consult Needed: No Regulatory affairs officer (For Healthcare) Advance Directive: Patient does not have advance directive;Patient would not like information    Additional Information 1:1 In Past 12 Months?: No CIRT Risk: No Elopement Risk: No Does patient have medical clearance?: No     Disposition: SPOKE [place], PT CAN START PROGRAM May 23, 2012 Disposition Initial Assessment Completed for this Encounter: Yes Disposition of Patient: Outpatient treatment Type of outpatient treatment: Psych Intensive Outpatient (pt to start psych-iop march 25,2014)  On Site Evaluation by:   Reviewed with Physician:     Bonnita Levan Winford 05/17/2012 3:56 PM

## 2012-05-18 ENCOUNTER — Encounter (HOSPITAL_COMMUNITY): Payer: Self-pay | Admitting: Emergency Medicine

## 2012-05-18 ENCOUNTER — Telehealth: Payer: Self-pay | Admitting: Internal Medicine

## 2012-05-18 ENCOUNTER — Other Ambulatory Visit: Payer: Self-pay

## 2012-05-18 ENCOUNTER — Inpatient Hospital Stay (HOSPITAL_COMMUNITY)
Admission: EM | Admit: 2012-05-18 | Discharge: 2012-05-25 | DRG: 918 | Disposition: A | Discharge status: Home / Self Care | Payer: Medicare Other | Attending: Internal Medicine | Admitting: Internal Medicine

## 2012-05-18 DIAGNOSIS — T43012A Poisoning by tricyclic antidepressants, intentional self-harm, initial encounter: Secondary | ICD-10-CM

## 2012-05-18 DIAGNOSIS — R000 Tachycardia, unspecified: Secondary | ICD-10-CM

## 2012-05-18 DIAGNOSIS — K5080 Crohn's disease of both small and large intestine without complications: Secondary | ICD-10-CM

## 2012-05-18 DIAGNOSIS — F1010 Alcohol abuse, uncomplicated: Secondary | ICD-10-CM | POA: Diagnosis present

## 2012-05-18 DIAGNOSIS — R64 Cachexia: Secondary | ICD-10-CM | POA: Diagnosis present

## 2012-05-18 DIAGNOSIS — E876 Hypokalemia: Secondary | ICD-10-CM | POA: Diagnosis not present

## 2012-05-18 DIAGNOSIS — Z79899 Other long term (current) drug therapy: Secondary | ICD-10-CM

## 2012-05-18 DIAGNOSIS — T43011A Poisoning by tricyclic antidepressants, accidental (unintentional), initial encounter: Secondary | ICD-10-CM | POA: Diagnosis not present

## 2012-05-18 DIAGNOSIS — F3289 Other specified depressive episodes: Secondary | ICD-10-CM | POA: Diagnosis not present

## 2012-05-18 DIAGNOSIS — T4271XA Poisoning by unspecified antiepileptic and sedative-hypnotic drugs, accidental (unintentional), initial encounter: Principal | ICD-10-CM | POA: Diagnosis present

## 2012-05-18 DIAGNOSIS — Z860100 Personal history of colon polyps, unspecified: Secondary | ICD-10-CM

## 2012-05-18 DIAGNOSIS — E7800 Pure hypercholesterolemia, unspecified: Secondary | ICD-10-CM | POA: Diagnosis present

## 2012-05-18 DIAGNOSIS — T50901A Poisoning by unspecified drugs, medicaments and biological substances, accidental (unintentional), initial encounter: Secondary | ICD-10-CM

## 2012-05-18 DIAGNOSIS — E43 Unspecified severe protein-calorie malnutrition: Secondary | ICD-10-CM

## 2012-05-18 DIAGNOSIS — F329 Major depressive disorder, single episode, unspecified: Secondary | ICD-10-CM | POA: Diagnosis present

## 2012-05-18 DIAGNOSIS — Z681 Body mass index (BMI) 19 or less, adult: Secondary | ICD-10-CM

## 2012-05-18 DIAGNOSIS — E785 Hyperlipidemia, unspecified: Secondary | ICD-10-CM

## 2012-05-18 DIAGNOSIS — IMO0002 Reserved for concepts with insufficient information to code with codable children: Secondary | ICD-10-CM

## 2012-05-18 DIAGNOSIS — K5090 Crohn's disease, unspecified, without complications: Secondary | ICD-10-CM | POA: Diagnosis not present

## 2012-05-18 DIAGNOSIS — F1910 Other psychoactive substance abuse, uncomplicated: Secondary | ICD-10-CM | POA: Diagnosis present

## 2012-05-18 DIAGNOSIS — M8580 Other specified disorders of bone density and structure, unspecified site: Secondary | ICD-10-CM

## 2012-05-18 DIAGNOSIS — F332 Major depressive disorder, recurrent severe without psychotic features: Secondary | ICD-10-CM

## 2012-05-18 DIAGNOSIS — R52 Pain, unspecified: Secondary | ICD-10-CM

## 2012-05-18 DIAGNOSIS — E559 Vitamin D deficiency, unspecified: Secondary | ICD-10-CM

## 2012-05-18 DIAGNOSIS — I498 Other specified cardiac arrhythmias: Secondary | ICD-10-CM | POA: Diagnosis present

## 2012-05-18 DIAGNOSIS — R627 Adult failure to thrive: Secondary | ICD-10-CM | POA: Diagnosis present

## 2012-05-18 DIAGNOSIS — T426X2A Poisoning by other antiepileptic and sedative-hypnotic drugs, intentional self-harm, initial encounter: Secondary | ICD-10-CM | POA: Diagnosis present

## 2012-05-18 DIAGNOSIS — R531 Weakness: Secondary | ICD-10-CM

## 2012-05-18 DIAGNOSIS — T4272XA Poisoning by unspecified antiepileptic and sedative-hypnotic drugs, intentional self-harm, initial encounter: Secondary | ICD-10-CM | POA: Diagnosis present

## 2012-05-18 DIAGNOSIS — F32A Depression, unspecified: Secondary | ICD-10-CM

## 2012-05-18 DIAGNOSIS — T43204A Poisoning by unspecified antidepressants, undetermined, initial encounter: Secondary | ICD-10-CM | POA: Diagnosis not present

## 2012-05-18 DIAGNOSIS — I10 Essential (primary) hypertension: Secondary | ICD-10-CM | POA: Diagnosis present

## 2012-05-18 DIAGNOSIS — K551 Chronic vascular disorders of intestine: Secondary | ICD-10-CM

## 2012-05-18 DIAGNOSIS — Z7189 Other specified counseling: Secondary | ICD-10-CM

## 2012-05-18 DIAGNOSIS — K8309 Other cholangitis: Secondary | ICD-10-CM | POA: Diagnosis present

## 2012-05-18 DIAGNOSIS — K8301 Primary sclerosing cholangitis: Secondary | ICD-10-CM

## 2012-05-18 DIAGNOSIS — F411 Generalized anxiety disorder: Secondary | ICD-10-CM | POA: Diagnosis present

## 2012-05-18 LAB — CBC
MCH: 32.1 pg (ref 26.0–34.0)
MCHC: 33.2 g/dL (ref 30.0–36.0)
Platelets: 190 10*3/uL (ref 150–400)
RBC: 4.21 MIL/uL — ABNORMAL LOW (ref 4.22–5.81)

## 2012-05-18 LAB — RAPID URINE DRUG SCREEN, HOSP PERFORMED
Amphetamines: NOT DETECTED
Benzodiazepines: POSITIVE — AB
Cocaine: NOT DETECTED
Opiates: NOT DETECTED

## 2012-05-18 LAB — SALICYLATE LEVEL: Salicylate Lvl: <2 mg/dL — ABNORMAL LOW (ref 2.8–20.0)

## 2012-05-18 LAB — COMPREHENSIVE METABOLIC PANEL
ALT: 43 U/L (ref 0–53)
AST: 35 U/L (ref 0–37)
Albumin: 2.5 g/dL — ABNORMAL LOW (ref 3.5–5.2)
CO2: 33 mEq/L — ABNORMAL HIGH (ref 19–32)
Calcium: 8.5 mg/dL (ref 8.4–10.5)
Sodium: 141 mEq/L (ref 135–145)
Total Protein: 6.5 g/dL (ref 6.0–8.3)

## 2012-05-18 MED ORDER — ONDANSETRON HCL 4 MG/2ML IJ SOLN: 4.0000 mg | Freq: Four times a day (QID) | INTRAMUSCULAR | Status: DC | PRN

## 2012-05-18 MED ORDER — ACETAMINOPHEN 650 MG RE SUPP: 650.0000 mg | Freq: Four times a day (QID) | RECTAL | Status: DC | PRN

## 2012-05-18 MED ORDER — ACETAMINOPHEN 325 MG PO TABS
650.0000 mg | ORAL_TABLET | Freq: Four times a day (QID) | ORAL | Status: DC | PRN
  Administered 2012-05-19 (×3): 650 mg via ORAL
  Filled 2012-05-18 (×3): qty 2

## 2012-05-18 MED ORDER — SENNOSIDES-DOCUSATE SODIUM 8.6-50 MG PO TABS
1.0000 | ORAL_TABLET | Freq: Every evening | ORAL | Status: DC | PRN
  Filled 2012-05-18: qty 1

## 2012-05-18 MED ORDER — VITAMIN B-1 100 MG PO TABS
100.0000 mg | ORAL_TABLET | Freq: Every day | ORAL | Status: DC
  Administered 2012-05-18 – 2012-05-23 (×6): 100 mg via ORAL
  Filled 2012-05-18 (×6): qty 1

## 2012-05-18 MED ORDER — PREDNISONE 5 MG PO TABS
15.0000 mg | ORAL_TABLET | Freq: Every day | ORAL | Status: DC
  Administered 2012-05-18 – 2012-05-22 (×5): 15 mg via ORAL
  Filled 2012-05-18 (×6): qty 1

## 2012-05-18 MED ORDER — ZOLPIDEM TARTRATE 5 MG PO TABS: 5.0000 mg | ORAL_TABLET | Freq: Every evening | ORAL | Status: DC | PRN

## 2012-05-18 MED ORDER — POTASSIUM CHLORIDE 10 MEQ/100ML IV SOLN
10.0000 meq | INTRAVENOUS | Status: AC
  Administered 2012-05-18 (×3): 10 meq via INTRAVENOUS
  Filled 2012-05-18 (×3): qty 100

## 2012-05-18 MED ORDER — ESCITALOPRAM OXALATE 20 MG PO TABS
20.0000 mg | ORAL_TABLET | Freq: Every day | ORAL | Status: DC
  Administered 2012-05-18 – 2012-05-19 (×2): 20 mg via ORAL
  Filled 2012-05-18 (×2): qty 1

## 2012-05-18 MED ORDER — FOLIC ACID 1 MG PO TABS
1.0000 mg | ORAL_TABLET | Freq: Every day | ORAL | Status: DC
  Administered 2012-05-18 – 2012-05-23 (×6): 1 mg via ORAL
  Filled 2012-05-18 (×6): qty 1

## 2012-05-18 MED ORDER — CLONAZEPAM 1 MG PO TABS
1.0000 mg | ORAL_TABLET | ORAL | Status: DC | PRN
  Administered 2012-05-18 – 2012-05-25 (×11): 1 mg via ORAL
  Filled 2012-05-18 (×11): qty 1

## 2012-05-18 MED ORDER — SODIUM CHLORIDE 0.9 % IJ SOLN
3.0000 mL | Freq: Two times a day (BID) | INTRAMUSCULAR | Status: DC
  Administered 2012-05-18 – 2012-05-20 (×4): 3 mL via INTRAVENOUS

## 2012-05-18 MED ORDER — POTASSIUM CHLORIDE CRYS ER 20 MEQ PO TBCR
20.0000 meq | EXTENDED_RELEASE_TABLET | Freq: Two times a day (BID) | ORAL | Status: DC
  Administered 2012-05-19 – 2012-05-23 (×9): 20 meq via ORAL
  Filled 2012-05-18 (×10): qty 1

## 2012-05-18 MED ORDER — ENOXAPARIN SODIUM 40 MG/0.4ML ~~LOC~~ SOLN
40.0000 mg | SUBCUTANEOUS | Status: DC
  Administered 2012-05-18 – 2012-05-24 (×7): 40 mg via SUBCUTANEOUS
  Filled 2012-05-18 (×8): qty 0.4

## 2012-05-18 MED ORDER — ONDANSETRON HCL 4 MG PO TABS: 4.0000 mg | ORAL_TABLET | Freq: Four times a day (QID) | ORAL | Status: DC | PRN

## 2012-05-18 NOTE — Progress Notes (Signed)
Pt in room with sitter at bedside w/o any s/s of distress or pain. Will continue monitor.

## 2012-05-18 NOTE — Progress Notes (Signed)
Mid-level notified of pt's bp. At this time we will continue to monitor him. No new orders given.

## 2012-05-18 NOTE — H&P (Signed)
[facility] History and Physical  [name]:[id number] DOB: 1965/04/06 DOA: 05/18/2012  Referring physician: er PCP: [name], MD  Specialists: Delrae Rend  Chief Complaint: suicide attempt  HPI: [name] is a 47 y.o. male  With a history of Crohn's Disease and depression.  Early today he took 40-50 pills of nortiptyline as he felt he was under a lot of stress. His parents went to his room about 10 this AM and found him to be very groggy.  Patient was seen in the ER 3 days ago as he was abusing his Klonopin by taking more than the prescription called for.  Patient is still very groggy and c/o dry mouth.  No CP, no diarrhea, no fever, no chills  In the ER, he was found to be sleepy, UDS was negative.  Poison control was spoken to and their recommendations were: observing pt until back to baseline and/or at least 24hrs. Recommends EKG every 2-3hrs times 2 more to monitor QRS level. If greater than 143m recommend restarting sodium bicarb.     Review of Systems: unable to do full ROS due to patient's sleepiness    Past Medical History  Diagnosis Date  . Crohn's disease of small and large intestines   . Hypercholesterolemia   . History of alcohol abuse   . History of substance abuse   . Depression   . 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 Primary sclerosing cholangitis     ? hepatitis overlap - liver bx x 2 and MRCP  . Allergy     SEASONAL  . Anxiety   . Cataract     BILATERAL/REMOVED  . Hypertension   . Personal history of adenomatous colonic polyps 12/2010, 03/2012    12/2010 - 8 mm serrated adenoma of rectum  . Chronic mesenteric ischemia   . Basal cell carcinoma of lower extremity 06/2011    on left leg  . Dysplastic nevus 06/2011    on right back.    Past Surgical History  Procedure Laterality Date  . Cataract extraction, bilateral    . Pylonidal cyst removal    . Foot surgery      right  . Percutaneous liver biopsy  2007 and  2008  . Colonoscopy  2001, 05/02/2003, 01/28/11    2012: Right colon Crohn's, rectal polyp  . Esophagogastroduodenoscopy  01/28/11    Normal  . Colonoscopy  03/31/2012    Procedure: COLONOSCOPY;  Surgeon: JJerene Bears MD;  [facility];  Service: Gastroenterology;  Laterality: N/A;   Social History:  reports that he quit smoking about 15 years ago. His smoking use included Cigarettes. He smoked 0.00 packs per day. He has never used smokeless tobacco. He reports that he does not drink alcohol or use illicit drugs. Lives at home with parents  Allergies  Allergen Reactions  . Asacol (Mesalamine)   . Azathioprine   . Mycophenolate Mofetil   . Other     NUTS  . Protein     Agent: Additives and preservatives  . Wheat Bran     Family History  Problem Relation Age of Onset  . Heart disease Father   . Thyroid cancer Father   . Breast cancer Mother   . Stomach cancer Mother   . Colon cancer Neg Hx     Prior to Admission medications   Medication Sig Start Date End Date Taking? Authorizing Provider  adalimumab (HUMIRA) 40 MG/0.8ML injection Inject 0.8 mLs (  40 mg total) into the skin every 14 (fourteen) days. 04/06/12  Yes Vena Rua, PA-C  ALPRAZolam Duanne Moron) 1 MG tablet Take 0.5 mg by mouth 3 (three) times daily as needed (sleep/anxiety).  04/12/12  Yes [name], MD  escitalopram (LEXAPRO) 20 MG tablet Take 20 mg by mouth daily.   Yes Historical Provider, MD  folic acid (FOLVITE) 1 MG tablet Take 1 mg by mouth daily.   Yes Historical Provider, MD  mirtazapine (REMERON) 30 MG tablet Take 30 mg by mouth at bedtime.   Yes Historical Provider, MD  potassium chloride SA (K-DUR,KLOR-CON) 20 MEQ tablet Take 20 mEq by mouth 2 (two) times daily. 04/28/12  Yes [name], MD  predniSONE (DELTASONE) 10 MG tablet Take 15 mg by mouth daily.   Yes Historical Provider, MD  thiamine (VITAMIN B-1) 100 MG tablet Take 100 mg by mouth daily.   Yes Historical Provider, MD  zolpidem  (AMBIEN) 5 MG tablet Take 5 mg by mouth at bedtime as needed (sleep).   Yes Historical Provider, MD   Physical Exam: Filed Vitals:   05/18/12 1315 05/18/12 1329 05/18/12 1330 05/18/12 1345  BP: 144/99 145/95 145/95 144/99  Pulse: 111 111 113 114  Temp:  97.1 F (36.2 C)    TempSrc:  Rectal    Resp: 19 17 20 24   SpO2: 100% 100% 100% 100%     General:  Sleepy, speech difficult to understand- thin pale male  Eyes: crusted  ENT: dry  Neck: supple  Cardiovascular: tachy  Respiratory: clear anterior  Abdomen: +BS, soft, NT  Skin: fine red colored rash on upper chest and part of left side of neck- blanchable  Musculoskeletal: moves all 4 extremities  Psychiatric: flat affect  Neurologic: unable to do full exam- no focal deficit  Labs on Admission:  Basic Metabolic Panel:  Recent Labs Lab 05/15/12 1817 05/18/12 1205  NA 134* 141  K 3.9 3.1*  CL 97 101  CO2 31 33*  GLUCOSE 146* 79  BUN 15 6  CREATININE 0.87 0.71  CALCIUM 9.1 8.5   Liver Function Tests:  Recent Labs Lab 05/15/12 1817 05/18/12 1205  AST 39* 35  ALT 50 43  ALKPHOS 181* 164*  BILITOT 0.3 0.4  PROT 6.7 6.5  ALBUMIN 2.7* 2.5*   No results found for this basename: LIPASE, AMYLASE,  in the last 168 hours No results found for this basename: AMMONIA,  in the last 168 hours CBC:  Recent Labs Lab 05/15/12 1817 05/18/12 1205  WBC 14.9* 8.3  NEUTROABS 12.3*  --   HGB 13.9 13.5  HCT 42.2 40.7  MCV 98.6 96.7  PLT 231 190   Cardiac Enzymes: No results found for this basename: CKTOTAL, CKMB, CKMBINDEX, TROPONINI,  in the last 168 hours  BNP (last 3 results) No results found for this basename: PROBNP,  in the last 8760 hours CBG:  Recent Labs Lab 05/18/12 1202  GLUCAP 78    Radiological Exams on Admission: No results found.  EKG: Independently reviewed. Sinus tachy at 105- QTc 461  Assessment/Plan Principal Problem:   Suicide and self-inflicted poisoning by other sedatives and  hypnotics Active Problems:   DEPRESSION   CROHN'S DISEASE, LARGE AND SMALL INTESTINES   Overdose   Hypokalemia   1. suicide attempt by overdose- will follow recommendations by poison control: EKG q 2-3 hours for at least 4 more hours- observing patieny until he returns to baseline.  Watching QTc- replace K+ and Mg if  needed, sitter ordered, psych consult- will watch in SDU overnight 2. Depression- psych consult 3. Crohns disease- GI to see in AM, does not appear to have an acute flair 4. hypokalemia repleat and check Mg  Psych called by me on 3/20 at 2:30 PM  Code Status: full Family Communication: patient at bedside Disposition Plan:   Time spent: 70 min  Eliseo Squires Luverna Degenhart [facility] Pager [phone]  If 7PM-7AM, please contact night-coverage [email or web address] Password [facility] 05/18/2012, 2:56 PM

## 2012-05-18 NOTE — Progress Notes (Signed)
Pt was due to receive 4 doses of K+ iv. Per the [facility] pt only got 3 doses. Unsure if he actually received all 4. Called mid level and stated to not give another IV K+ and will recheck pt's K+ in the am. Pt due for BMET in am. Will continue to monitor.

## 2012-05-18 NOTE — Progress Notes (Signed)
Poison Control called to check on the patient. States "he sounds as if he is stabalizing and they will continue to call throughout the night and in the morning to see if there are any changes.".

## 2012-05-18 NOTE — ED Notes (Signed)
Spoke with poison control of [place] (covering for , Recommend observing pt until back to baseline and/or at least 24hrs.  Recommends EKG every 2-3hrs times 2 more to monitor QRS level. If greater than 184m recommend restarting sodium bicarb.   Usually cardiac toxicity symptoms will show within 6hr time frame.

## 2012-05-18 NOTE — ED Provider Notes (Signed)
History     CSN: [id number]  Arrival date & time 05/18/12  1124   First MD Initiated Contact with Patient 05/18/12 1243      Chief Complaint  Patient presents with  . Drug Overdose    (Consider location/radiation/quality/duration/timing/severity/associated sxs/prior treatment) HPI Comments: [name] is a 47 y.o. male who is here for an alleged overdose on nortriptyline. His parents came  to his room this morning at 10:00 to awaken him, found him groggy, and at that, point, he told them that he had taken 40 Nortriptyline tablets, at 2 AM, this morning. The parents feel that he was trying to kill himself. He has been increasingly depressed and anxious since he was seen here 3 days ago. At that time he was abusing his Klonopin, by taking more than his prescribed. He is not suicidal 3 days ago. He has been increasingly depressed over the last several months. Depression seems to stem from his medical illness.   Level V caveat- altered mental status  Patient is a 47 y.o. male presenting with Overdose. The history is provided by the patient, the EMS personnel and a relative (Mother and father).  Drug Overdose    Past Medical History  Diagnosis Date  . Crohn's disease of small and large intestines   . Hypercholesterolemia   . History of alcohol abuse   . History of substance abuse   . Depression   . 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 Primary sclerosing cholangitis     ? hepatitis overlap - liver bx x 2 and MRCP  . Allergy     SEASONAL  . Anxiety   . Cataract     BILATERAL/REMOVED  . Hypertension   . Personal history of adenomatous colonic polyps 12/2010, 03/2012    12/2010 - 8 mm serrated adenoma of rectum  . Chronic mesenteric ischemia   . Basal cell carcinoma of lower extremity 06/2011    on left leg  . Dysplastic nevus 06/2011    on right back.     Past Surgical History  Procedure Laterality Date  . Cataract extraction, bilateral    . Pylonidal cyst  removal    . Foot surgery      right  . Percutaneous liver biopsy  2007 and 2008  . Colonoscopy  2001, 05/02/2003, 01/28/11    2012: Right colon Crohn's, rectal polyp  . Esophagogastroduodenoscopy  01/28/11    Normal  . Colonoscopy  03/31/2012    Procedure: COLONOSCOPY;  Surgeon: Jerene Bears, MD;  Location: [facility] ENDOSCOPY;  Service: Gastroenterology;  Laterality: N/A;    Family History  Problem Relation Age of Onset  . Heart disease Father   . Thyroid cancer Father   . Breast cancer Mother   . Stomach cancer Mother   . Colon cancer Neg Hx     History  Substance Use Topics  . Smoking status: Former Smoker    Types: Cigarettes    Quit date: 03/28/1997  . Smokeless tobacco: Never Used  . Alcohol Use: No      Review of Systems  Unable to perform ROS   Allergies  Asacol; Azathioprine; Mycophenolate mofetil; Other; Protein; and Wheat bran  Home Medications   Current Outpatient Rx  Name  Route  Sig  Dispense  Refill  . adalimumab (HUMIRA) 40 MG/0.8ML injection   Subcutaneous   Inject 0.8 mLs (40 mg total) into the skin every 14 (fourteen) days.   2 each  4   . ALPRAZolam (XANAX) 1 MG tablet   Oral   Take 0.5 mg by mouth 3 (three) times daily as needed (sleep/anxiety).          Marland Kitchen escitalopram (LEXAPRO) 20 MG tablet   Oral   Take 20 mg by mouth daily.         . folic acid (FOLVITE) 1 MG tablet   Oral   Take 1 mg by mouth daily.         . mirtazapine (REMERON) 30 MG tablet   Oral   Take 30 mg by mouth at bedtime.         . potassium chloride SA (K-DUR,KLOR-CON) 20 MEQ tablet   Oral   Take 20 mEq by mouth 2 (two) times daily.         . predniSONE (DELTASONE) 10 MG tablet   Oral   Take 15 mg by mouth daily.         Marland Kitchen thiamine (VITAMIN B-1) 100 MG tablet   Oral   Take 100 mg by mouth daily.         Marland Kitchen zolpidem (AMBIEN) 5 MG tablet   Oral   Take 5 mg by mouth at bedtime as needed (sleep).           BP 144/99  Pulse 114  Temp(Src) 97.1  F (36.2 C) (Rectal)  Resp 24  SpO2 100%  Physical Exam  Nursing note and vitals reviewed. Constitutional: He appears well-developed.  Undernourished, frail  HENT:  Head: Normocephalic and atraumatic.  Right Ear: External ear normal.  Left Ear: External ear normal.  Eyes: Conjunctivae and EOM are normal. Pupils are equal, round, and reactive to light.  Neck: Normal range of motion and phonation normal. Neck supple.  Cardiovascular: Normal rate, regular rhythm, normal heart sounds and intact distal pulses.   Pulmonary/Chest: Effort normal and breath sounds normal. He exhibits no bony tenderness.  Abdominal: Soft. Normal appearance. There is no tenderness.  Musculoskeletal: Normal range of motion.  Strength is 4/5 in the extremities, arms, and legs, bilaterally  Neurological: He is alert. He has normal strength. No cranial nerve deficit or sensory deficit. He exhibits normal muscle tone.  Eyes open spontaneously. He follows commands. He is dysarthric. No focal asymmetry of musculature appear he does not appear to have receptive aphasia. There is a mild tremor, with the intention.  Skin: Skin is warm, dry and intact.  Psychiatric: He has a normal mood and affect. His behavior is normal. Judgment and thought content normal.    ED Course  Procedures (including critical care time)  Emergency Department treatment: IV fluids  Observation with serial EKG and cardiac monitor; no ectopy, no prolongation of the QRS  His GI doctor, Carlean Purl, came to see the patient in the ED. He will follow along, as a consult. I discussed the case, with him   Date: 12/17/2011- 11:47  Rate: 115  Rhythm: sinus tachycardia  QRS Axis: normal  PR and QT Intervals: normal  ST/T Wave abnormalities: nonspecific T wave changes  PR and QRS Conduction Disutrbances:none  Narrative Interpretation:   Old EKG Reviewed: none available     Date: 12/17/2011  Rate: 108  Rhythm: sinus tachycardia  QRS Axis: normal   PR and QT Intervals: normal  ST/T Wave abnormalities: normal  PR and QRS Conduction Disutrbances:none  Narrative Interpretation:   Old EKG Reviewed: unchanged- from earlier today   poison control has been contacted : They state that the patient  is to be observed for at least 24 hours and until his mental status which is baseline ; prior to complaint medical clearance. The patient will be admitted to a primary care service for observation. They're recommending at least 2 more EKGs, every 2 hours.   CRITICAL CARE Performed by: Richarda Blade   Total critical care time: 45 minutes  Critical care time was exclusive of separately billable procedures and treating other patients.  Critical care was necessary to treat or prevent imminent or life-threatening deterioration.  Critical care was time spent personally by me on the following activities: development of treatment plan with patient and/or surrogate as well as nursing, discussions with consultants, evaluation of patient's response to treatment, examination of patient, obtaining history from patient or surrogate, ordering and performing treatments and interventions, ordering and review of laboratory studies, ordering and review of radiographic studies, pulse oximetry and re-evaluation of patient's condition.  Labs Reviewed  CBC - Abnormal; Notable for the following:    RBC 4.21 (*)    All other components within normal limits  COMPREHENSIVE METABOLIC PANEL - Abnormal; Notable for the following:    Potassium 3.1 (*)    CO2 33 (*)    Albumin 2.5 (*)    Alkaline Phosphatase 164 (*)    All other components within normal limits  SALICYLATE LEVEL - Abnormal; Notable for the following:    Salicylate Lvl <0.9 (*)    All other components within normal limits  URINE RAPID DRUG SCREEN (HOSP PERFORMED) - Abnormal; Notable for the following:    Benzodiazepines POSITIVE (*)    All other components within normal limits  ETHANOL  ACETAMINOPHEN  LEVEL  GLUCOSE, CAPILLARY   No results found.   1. Intentional nortriptyline overdose, initial encounter   2. Tachycardia   3. Depression       MDM  Intentional overdose, as a suicidal attempt. There is moderate toxicity with nortriptyline. He'll need to be observed in a monitored unit, likely step down. His blood pressure has been okay. He needs continued repeat serial EKG tracings.      Plan: Admit  Richarda Blade, MD 05/18/12 2150

## 2012-05-18 NOTE — ED Notes (Signed)
YSH:UOHF<GB> Expected date:05/18/12<BR> Expected time:11:11 AM<BR> Means of arrival:Ambulance<BR> Comments:<BR> Overdose ETA 36mns

## 2012-05-18 NOTE — Progress Notes (Signed)
Patient being admitted to hospitalists for suspected nortriptyline overdose.  I have let Dr. Hilarie Fredrickson know and we will see him in consultation for his GI problems - will get to him by tomorrow. If something needed sooner - please call us.  [name], MD, [facility] Gastroenterology [phone] (pager) 05/18/2012 1:09 PM

## 2012-05-18 NOTE — ED Notes (Signed)
Per EMS pt comes from home where to took apporx 40 tabs of notriptylin around 2am.  Pt has 18g left AC. 71mq ivp bicarb; 1074m sodium bicarb drip going now given by EMS in route.  Pt had xanax OD one week ago. Pt has Crohn's disease and causing his depression to worsen.  EMS states pt has tremors, intermittent responsive and slurred speech.

## 2012-05-18 NOTE — ED Notes (Signed)
Notified poison control. Spoke with Bevely Palmer.   If pt's EKG QRS prolonged >170m they recommend sodium bicarb 1-233m/kg bolus IVP.  Don;t recommend sodium bicarb infusion.  If QTC prolonged, avoid haldol and geodon because they also prolong QTC.  Monitor mag and potassium levels and replace if decreased.  May show torsades de pointes.  HTN may/may not be related to ingestion due to pt's PMH of HTN.  If pt has been on lithium before, recommend checking lithium level.

## 2012-05-18 NOTE — Telephone Encounter (Signed)
Pts father called to report that the pt overdosed again and they are taking him to the ER to be evaluated. [name] notified.

## 2012-05-19 DIAGNOSIS — T43502A Poisoning by unspecified antipsychotics and neuroleptics, intentional self-harm, initial encounter: Secondary | ICD-10-CM | POA: Diagnosis not present

## 2012-05-19 DIAGNOSIS — T438X2A Poisoning by other psychotropic drugs, intentional self-harm, initial encounter: Secondary | ICD-10-CM | POA: Diagnosis not present

## 2012-05-19 DIAGNOSIS — T43011A Poisoning by tricyclic antidepressants, accidental (unintentional), initial encounter: Secondary | ICD-10-CM | POA: Diagnosis not present

## 2012-05-19 DIAGNOSIS — F329 Major depressive disorder, single episode, unspecified: Secondary | ICD-10-CM | POA: Diagnosis not present

## 2012-05-19 DIAGNOSIS — E876 Hypokalemia: Secondary | ICD-10-CM | POA: Diagnosis not present

## 2012-05-19 DIAGNOSIS — F3289 Other specified depressive episodes: Secondary | ICD-10-CM | POA: Diagnosis not present

## 2012-05-19 DIAGNOSIS — F411 Generalized anxiety disorder: Secondary | ICD-10-CM | POA: Diagnosis not present

## 2012-05-19 DIAGNOSIS — K5090 Crohn's disease, unspecified, without complications: Secondary | ICD-10-CM | POA: Diagnosis not present

## 2012-05-19 LAB — CBC
HCT: 35.9 % — ABNORMAL LOW (ref 39.0–52.0)
Hemoglobin: 12.1 g/dL — ABNORMAL LOW (ref 13.0–17.0)
MCH: 32.5 pg (ref 26.0–34.0)
MCV: 96.5 fL (ref 78.0–100.0)
RBC: 3.72 MIL/uL — ABNORMAL LOW (ref 4.22–5.81)

## 2012-05-19 LAB — BASIC METABOLIC PANEL
CO2: 26 mEq/L (ref 19–32)
Calcium: 8.7 mg/dL (ref 8.4–10.5)
Glucose, Bld: 73 mg/dL (ref 70–99)
Potassium: 4 mEq/L (ref 3.5–5.1)
Sodium: 134 mEq/L — ABNORMAL LOW (ref 135–145)

## 2012-05-19 MED ORDER — SODIUM CHLORIDE 0.9 % IV SOLN
INTRAVENOUS | Status: DC
  Administered 2012-05-19 – 2012-05-20 (×4): via INTRAVENOUS
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
## 2012-05-19 NOTE — Progress Notes (Signed)
Thank you for consulting the Palliative Medicine Team at [facility] to meet your patient's and family's needs.   The reason that you asked [place] to see your patient is for goals of care discussion  We have scheduled your patient for a meeting: tomorrow, Saturday 3/22 @ 10 am  The Surrogate decision make is: parents [place] and [employer] information: [place] [phone]; [place]: [phone]  Other family members that need to be present:  Brother, will be traveling from [place] patient is able/unable to participate: able to participate -patient stated he prefers to be called  '[name]' was awake during my visit, kept eyes closed for most of discussion, did state he was agreeable with goals of care discussion and family participating in this discussion- patient's mother and father at bedside, father asked if this meeting could occur tomorrow when patient's brother could be available to attend - when asked [name] stated that would be 'OK'    Danton Sewer, [place] 05/19/2012, 3:12 PM Palliative Medicine Team RN Liaison [phone]

## 2012-05-19 NOTE — Clinical Documentation Improvement (Signed)
MALNUTRITION DOCUMENTATION CLARIFICATION  THIS DOCUMENT IS NOT A PERMANENT PART OF THE MEDICAL RECORD  TO RESPOND TO THE THIS QUERY, FOLLOW THE INSTRUCTIONS BELOW:  1. If needed, update documentation for the patient's encounter via the notes activity.  2. Access this query again and click edit on the In [employer].  3. After updating, or not, click F2 to complete all highlighted (required) fields concerning your review. Select "additional documentation in the medical record" OR "no additional documentation provided".  4. Click Sign note button.  5. The deficiency will fall out of your In Basket *Please let us know if you are not able to complete this workflow by phone or e-mail (listed below).  Please update your documentation within the medical record to reflect your response to this query.                                                                                        05/19/12   Dear Dr. Eliseo Squires / Associates,  In a better effort to capture your patient's severity of illness, reflect appropriate length of stay and utilization of resources, a review of the patient medical record has revealed the following indicators.    Based on your clinical judgment, please clarify and document in a progress note and/or discharge summary the clinical condition associated with the following supporting information:  In responding to this query please exercise your independent judgment.  The fact that a query is asked, does not imply that any particular answer is desired or expected.   Possible Clinical Conditions?  _______Severe Malnutrition    _______Protein Calorie Malnutrition  _______Severe Protein Calorie Malnutrition  _______Cachexia    _______Other Condition  _______Cannot clinically determine      Risk Factors: Undernourished, frail noted per 3/20 progress notes. Refusing to eat per 3/21 progress notes.   Signs & Symptoms: Ht: 6'1"    Wt: 131lb 2oz  BMI:  17.3    You may use possible, probable, or suspect with inpatient documentation. possible, probable, suspected diagnoses MUST be documented at the time of discharge  Reviewed: additional documentation in the medical record  Thank Bertram Gala  Clinical Documentation Specialist:  Pager: [phone]  Phone: [place]

## 2012-05-19 NOTE — Progress Notes (Addendum)
[facility] PROGRESS NOTE  [name] KXF:[id number] DOB: 1965/05/10 DOA: 05/18/2012 PCP: [name], MD  Assessment/Plan:  suicide attempt by overdose- followed recommendations by poison control: EKG watching QTc- replaced K+ and Mg ok, sitter ordered, psych consult- transfer out of SDU to floor -refusing to eat   Depression- psych consult, on lexapro  Crohns disease- GI to see in AM, does not appear to have an acute flair- but his overall health and disablity related to Crohns is causing him to want to go to hospice and die  hypokalemia repleated   Malnutrition  Code Status: full Family Communication: patient at bedside Disposition Plan: per psych   Consultants:  GI  Procedures:  none  Antibiotics:  none  HPI/Subjective: Says he is not in physical pain but wants to go to hospice and die because of his Crohn's disease Refusing to eat.    Objective: Filed Vitals:   05/18/12 2200 05/19/12 0000 05/19/12 0400 05/19/12 0401  BP: 111/75 115/79 117/84   Pulse: 98 98 103   Temp:  98.1 F (36.7 C)  97.8 F (36.6 C)  TempSrc:   Oral   Resp: 16 14 19    Height:      Weight:      SpO2: 99% 97% 97%     Intake/Output Summary (Last 24 hours) at 05/19/12 0824 Last data filed at 05/18/12 2244  Gross per 24 hour  Intake      0 ml  Output   1360 ml  Net  -1360 ml   Filed Weights   05/18/12 1710  Weight: 59.5 kg (131 lb 2.8 oz)    Exam:   General:  Thin, ill appearing male  Cardiovascular: tachy  Respiratory: clear anterior  Abdomen: +BS, soft  Musculoskeletal: moves all 4 extremities  Data Reviewed: Basic Metabolic Panel:  Recent Labs Lab 05/15/12 1817 05/18/12 1200 05/18/12 1205 05/19/12 0340  NA 134*  --  141 134*  K 3.9  --  3.1* 4.0  CL 97  --  101 101  CO2 31  --  33* 26  GLUCOSE 146*  --  79 73  BUN 15  --  6 7  CREATININE 0.87  --  0.71 0.66  CALCIUM 9.1  --  8.5 8.7  MG  --  2.2  --   --    Liver Function  Tests:  Recent Labs Lab 05/15/12 1817 05/18/12 1205  AST 39* 35  ALT 50 43  ALKPHOS 181* 164*  BILITOT 0.3 0.4  PROT 6.7 6.5  ALBUMIN 2.7* 2.5*   No results found for this basename: LIPASE, AMYLASE,  in the last 168 hours No results found for this basename: AMMONIA,  in the last 168 hours CBC:  Recent Labs Lab 05/15/12 1817 05/18/12 1205 05/19/12 0340  WBC 14.9* 8.3 7.6  NEUTROABS 12.3*  --   --   HGB 13.9 13.5 12.1*  HCT 42.2 40.7 35.9*  MCV 98.6 96.7 96.5  PLT 231 190 177   Cardiac Enzymes: No results found for this basename: CKTOTAL, CKMB, CKMBINDEX, TROPONINI,  in the last 168 hours BNP (last 3 results) No results found for this basename: PROBNP,  in the last 8760 hours CBG:  Recent Labs Lab 05/18/12 1202  GLUCAP 78    Recent Results (from the past 240 hour(s))  MRSA PCR SCREENING     Status: None   Collection Time    05/18/12  5:27 PM      Result Value Range Status  MRSA by PCR NEGATIVE  NEGATIVE Final   Comment:            The GeneXpert MRSA Assay (FDA     approved for NASAL specimens     only), is one component of a     comprehensive MRSA colonization     surveillance program. It is not     intended to diagnose MRSA     infection nor to guide or     monitor treatment for     MRSA infections.     Studies: No results found.  Scheduled Meds: . enoxaparin (LOVENOX) injection  40 mg Subcutaneous Q24H  . escitalopram  20 mg Oral Daily  . folic acid  1 mg Oral Daily  . potassium chloride SA  20 mEq Oral BID  . predniSONE  15 mg Oral Daily  . sodium chloride  3 mL Intravenous Q12H  . thiamine  100 mg Oral Daily   Continuous Infusions: . sodium chloride      Principal Problem:   Suicide and self-inflicted poisoning by other sedatives and hypnotics Active Problems:   DEPRESSION   CROHN'S DISEASE, LARGE AND SMALL INTESTINES   Overdose   Hypokalemia    Time spent: [place], [place] [facility] Pager [phone]. If 7PM-7AM,  please contact night-coverage at [email or web address], password [facility] 05/19/2012, 8:24 AM  LOS: 1 day

## 2012-05-19 NOTE — Progress Notes (Signed)
Clinical Social Work Department CLINICAL SOCIAL WORK PSYCHIATRY SERVICE LINE ASSESSMENT 05/19/2012  Patient:  [name]  Account:  [id number]  [place] Date:  05/18/2012  Clinical Social Worker:  Sindy Messing, LCSW  Date/Time:  05/19/2012 01:30 PM Referred by:  Physician  Date referred:  05/19/2012 Reason for Referral  Behavioral Health Issues   Presenting Symptoms/Problems (In the person's/family's own words):   Psych consulted due to patient overdosing on medication.   Abuse/Neglect/Trauma History (check all that apply)  Denies history   Abuse/Neglect/Trauma Comments:   Psychiatric History (check all that apply)  Outpatient treatment   Psychiatric medications:  Xanax  Remeron  Ambien  Lexapro   Current Mental Health Hospitalizations/Previous Mental Health History:   Patient reports he has felt depressed for about a year. Patient reports he started taking Lexapro about 2 weeks ago.   Current provider:   PCP prescribes medication   Place and Date:   N/A   Current Medications:   acetaminophen, acetaminophen, clonazePAM, ondansetron (ZOFRAN) IV, ondansetron, senna-docusate, zolpidem            . enoxaparin (LOVENOX) injection  40 mg Subcutaneous Q24H  . folic acid  1 mg Oral Daily  . potassium chloride SA  20 mEq Oral BID  . predniSONE  15 mg Oral Daily  . sodium chloride  3 mL Intravenous Q12H  . thiamine  100 mg Oral Daily   Previous Impatient Admission/Date/Reason:   Patient denies any hospitalizations.   Emotional Health / Current Symptoms    Suicide/Self Harm  Suicidal ideation (ex: "I can't take any more,I wish I could disappear")   Suicide attempt in the past:   Patient feels hopeless about future. Patient reports he is ready to go to hospice. Patient reports that he took several pills prior to admission because he wanted to die. Patient reports he wants to talk with palliative team regarding feeling comfortable.   Other harmful behavior:   None  reported   Psychotic/Dissociative Symptoms  None reported   Other Psychotic/Dissociative Symptoms:    Attention/Behavioral Symptoms  Withdrawn   Other Attention / Behavioral Symptoms:   Patient minimally engaged and soft spoken.    Cognitive Impairment  Within Normal Limits   Other Cognitive Impairment:    Mood and Adjustment  Flat    Stress, Anxiety, Trauma, Any Recent Loss/Stressor  Other - See comment   Anxiety (frequency):   Phobia (specify):   Compulsive behavior (specify):   Obsessive behavior (specify):   Other:   Patient reports that he is unable to get relief from Crohn's and no longer has the desire to live. Patient reports he is in constant pain and has felt terrible on any medication that has been prescribed to relieve symptoms.   Substance Abuse/Use  None   SBIRT completed (please refer for detailed history):  N  Self-reported substance use:   Patient denies any substance use.   Urinary Drug Screen Completed:  Y Alcohol level:   Benzos positive    Environmental/Housing/Living Arrangement  With Biological Parent(s)   Who is in the home:   Mom and dad   Emergency contact:  [place]  Medicare  Medicaid   Patient's Strengths and Goals (patient's own words):   Patient has supportive parents and stable housing.   Clinical Social Worker's Interpretive Summary:   CSW received referral to complete psychosocial assessment. CSW reviewed chart and met with patient and parents at bedside. Patient agreeable to family involvement. CSW introduced myself and  explained role. Patient agreeable to assessment.    Patient lives at home with mom and dad. Patient is not married but has one dtr that lives in [place]. Patient reports that he has been living with Crohn's for several years and although MDs have changed medications, he has never felt relief from symptoms. Patient reports that he has felt depressed recently and was started on Lexapro  by his PCP. Patient reports he is still feeling depressed.    Patient continues to report that he wants to go to hospice. CSW inquired if patient felt he was able to make informed decisions at this time. CSW and patient spoke about symptoms of depression and the need to give medication time to be effective. Patient reports that he has felt depressed and thought about wanting to die for several years.    Mom reports that patient has been depressed for at least a few months now. Mom and dad agree that they will support any decision that patient desires. Mom reports that they have requested a [place] meeting and feels that patient is well enough to participate in the meeting.    Patient minimally engaged throughout assessment with no eye contact. Patient has a flat affect and is not interested in treatment for depression. Patient feels that he is depressed due to medical conditions but reports he is tired of living with Crohn's disease. Patient reports no active SI or HI but does not have the desire to live anymore.    CSW staffed case with psych MD and will continue to follow.   Disposition:  Recommend Psych CSW continuing to support while in hospital

## 2012-05-20 DIAGNOSIS — T43011A Poisoning by tricyclic antidepressants, accidental (unintentional), initial encounter: Secondary | ICD-10-CM | POA: Diagnosis not present

## 2012-05-20 DIAGNOSIS — R5381 Other malaise: Secondary | ICD-10-CM | POA: Diagnosis not present

## 2012-05-20 DIAGNOSIS — T43502A Poisoning by unspecified antipsychotics and neuroleptics, intentional self-harm, initial encounter: Secondary | ICD-10-CM

## 2012-05-20 DIAGNOSIS — F329 Major depressive disorder, single episode, unspecified: Secondary | ICD-10-CM | POA: Diagnosis not present

## 2012-05-20 DIAGNOSIS — R52 Pain, unspecified: Secondary | ICD-10-CM | POA: Diagnosis not present

## 2012-05-20 DIAGNOSIS — Z7189 Other specified counseling: Secondary | ICD-10-CM

## 2012-05-20 DIAGNOSIS — E43 Unspecified severe protein-calorie malnutrition: Secondary | ICD-10-CM

## 2012-05-20 DIAGNOSIS — K5090 Crohn's disease, unspecified, without complications: Secondary | ICD-10-CM

## 2012-05-20 DIAGNOSIS — R531 Weakness: Secondary | ICD-10-CM | POA: Diagnosis present

## 2012-05-20 DIAGNOSIS — R5383 Other fatigue: Secondary | ICD-10-CM

## 2012-05-20 DIAGNOSIS — R627 Adult failure to thrive: Secondary | ICD-10-CM | POA: Diagnosis present

## 2012-05-20 DIAGNOSIS — F3289 Other specified depressive episodes: Secondary | ICD-10-CM | POA: Diagnosis not present

## 2012-05-20 MED ORDER — TRAMADOL HCL 50 MG PO TABS: 50.0000 mg | ORAL_TABLET | Freq: Four times a day (QID) | ORAL | Status: DC | PRN

## 2012-05-20 NOTE — Progress Notes (Signed)
[facility] PROGRESS NOTE  [name] OIN:[id number] DOB: 25-Jan-1966 DOA: 05/18/2012 PCP: [name], MD  Assessment/Plan:  suicide attempt by overdose- followed recommendations by poison control: EKG watching QTc- replaced K+ and Mg ok,  psych recommends no further intervention except for palliative care consult -refusing to eat -d/c sitter  Depression- avoid SSRIs  Crohns disease- GI to see per note on 3/19, does not appear to have an acute flair- but his overall health and disablity related to Crohns is causing him to want to go to hospice and die  hypokalemia repleated   Malnutrition  Code Status: full Family Communication: patient at bedside Disposition Plan: per psych   Consultants:  GI  Procedures:  none  Antibiotics:  none  HPI/Subjective: Not wanting to eat still     Objective: Filed Vitals:   05/19/12 1250 05/19/12 1526 05/19/12 2308 05/20/12 0617  BP: 157/96 126/82 124/78 128/80  Pulse: 109 118 97 114  Temp: 98.5 F (36.9 C) 98.1 F (36.7 C) 98.4 F (36.9 C) 97.8 F (36.6 C)  TempSrc: Oral Oral Oral Oral  Resp: 22 16 20 20   Height:      Weight:      SpO2: 100% 98% 99% 98%    Intake/Output Summary (Last 24 hours) at 05/20/12 7096 Last data filed at 05/20/12 0801  Gross per 24 hour  Intake   1635 ml  Output   1200 ml  Net    435 ml   Filed Weights   05/18/12 1710  Weight: 59.5 kg (131 lb 2.8 oz)    Exam:   General:  Thin, ill appearing male  Cardiovascular: tachy  Respiratory: clear anterior  Abdomen: +BS, soft  Musculoskeletal: moves all 4 extremities  Data Reviewed: Basic Metabolic Panel:  Recent Labs Lab 05/15/12 1817 05/18/12 1200 05/18/12 1205 05/19/12 0340  NA 134*  --  141 134*  K 3.9  --  3.1* 4.0  CL 97  --  101 101  CO2 31  --  33* 26  GLUCOSE 146*  --  79 73  BUN 15  --  6 7  CREATININE 0.87  --  0.71 0.66  CALCIUM 9.1  --  8.5 8.7  MG  --  2.2  --   --    Liver Function  Tests:  Recent Labs Lab 05/15/12 1817 05/18/12 1205  AST 39* 35  ALT 50 43  ALKPHOS 181* 164*  BILITOT 0.3 0.4  PROT 6.7 6.5  ALBUMIN 2.7* 2.5*   No results found for this basename: LIPASE, AMYLASE,  in the last 168 hours No results found for this basename: AMMONIA,  in the last 168 hours CBC:  Recent Labs Lab 05/15/12 1817 05/18/12 1205 05/19/12 0340  WBC 14.9* 8.3 7.6  NEUTROABS 12.3*  --   --   HGB 13.9 13.5 12.1*  HCT 42.2 40.7 35.9*  MCV 98.6 96.7 96.5  PLT 231 190 177   Cardiac Enzymes: No results found for this basename: CKTOTAL, CKMB, CKMBINDEX, TROPONINI,  in the last 168 hours BNP (last 3 results) No results found for this basename: PROBNP,  in the last 8760 hours CBG:  Recent Labs Lab 05/18/12 1202  GLUCAP 78    Recent Results (from the past 240 hour(s))  MRSA PCR SCREENING     Status: None   Collection Time    05/18/12  5:27 PM      Result Value Range Status   MRSA by PCR NEGATIVE  NEGATIVE Final  Comment:            The GeneXpert MRSA Assay (FDA     approved for NASAL specimens     only), is one component of a     comprehensive MRSA colonization     surveillance program. It is not     intended to diagnose MRSA     infection nor to guide or     monitor treatment for     MRSA infections.     Studies: No results found.  Scheduled Meds: . enoxaparin (LOVENOX) injection  40 mg Subcutaneous Q24H  . folic acid  1 mg Oral Daily  . potassium chloride SA  20 mEq Oral BID  . predniSONE  15 mg Oral Daily  . sodium chloride  3 mL Intravenous Q12H  . thiamine  100 mg Oral Daily   Continuous Infusions: . sodium chloride 75 mL/hr at 05/20/12 9249    Principal Problem:   Suicide and self-inflicted poisoning by other sedatives and hypnotics Active Problems:   DEPRESSION   CROHN'S DISEASE, LARGE AND SMALL INTESTINES   Overdose   Hypokalemia    Time spent: [place], [facility] Pager [phone]. If 7PM-7AM, please  contact night-coverage at [email or web address], password [email or web address] 05/20/2012, 8:21 AM  LOS: 2 days

## 2012-05-20 NOTE — Consult Note (Signed)
Patient [name]      DOB: 1965/05/10      GUY:[id number]     Consult Note from the Palliative Medicine Team at [facility] Requested by: Dr Eliseo Squires     PCP: [name], MD Reason for Consultation: Clarification of [place] and options     Phone Number:[phone]  Assessment of patients Current state: Severe Crohn's disease, overall failure to thrive, poor po intake, weight loss, continued diarrhea, pain, multiple rehospitlaization--recent overdose in attempt "get out of the pain"--patient making decision for a pure comfort care path, patient hopes for support for family  Consult is for review of medical treatment options, clarification of goals of care and end of life issues, disposition and options, and symptom recommendation.  This NP Wadie Lessen reviewed medical records, received report from team, assessed the patient and then meet at the patient's bedside along with his parents, [place] and Jahan Friedlander, [place] [phone], # [phone]  H# [phone]  to discuss diagnosis prognosis, GOC, EOL wishes disposition and options.   A detailed discussion was had today regarding advanced directives.  Concepts specific to code status, artifical feeding and hydration, continued IV antibiotics and rehospitalization was had.  The difference between a aggressive medical intervention path  and a palliative comfort care path for this patient at this time was had.  Values and goals of care important to patient and family were attempted to be elicited.  Concept of Hospice and Palliative Care were discussed  Natural trajectory and expectations at EOL were discussed.  Questions and concerns addressed.  Family encouraged to call with questions or concerns.  PMT will continue to support holistically.  Dr Gemma Payor present for portion of above discussion,      Goals of Care: 1.  Code Status:DNR/DNI   2. Scope of Treatment: 1. Vital Signs: daily  2. Respiratory/Oxygen: if needed for  comfort 3. Nutritional Support/Tube Feeds:no artificial feeding for now  4. Antibiotics: undecided for future IV use 5. Blood Products: Undecided 6. OAC:[id number] until further discussion with Dr Leroy Kennedy 7. Review of Medications to be discontinued: continue present medications until further clarification of GOC 8. Labs: continue for now 9. Telemetry:none 10. Consults: awaits conversation with Dr Leroy Kennedy.  Dr Hilarie Fredrickson will try to contact Dr Leroy Kennedy today and discuss family's request to meet tomorrow with patient's brother, it at all possible.  This NP awaits call back.   4. Disposition: Dependant on outcomes   3. Symptom Management:   1. Anxiety/Agitation: Klonopin 1 mg every 4 hrs prn 2. Pain: Tramadol 50 mg every 6 hrs prn (per patient this helps his pain in past)  4. Psychosocial:  Emotional support offered to patient and family.  This is especially difficult for patient's father.  He verbalizes hope/beleif that "somewhere" there must be "something" to fix this.  Allowed space for all to voice though's, feelings and concerns  5. Spiritual: Chaplain consulted     Brief SWF:UXNATF Crohn's disease, overall failure to thrive, poor po intake, weight loss, continued diarrhea, pain, multiple rehospitlaization--recent overdose in attempt "get out of the pain"--patient making decision for a pure comfort care path, patient hopes for support for family     ROS: weight loss, weakness, poor appetite, diarrhea, pain and sadness     PMH:  Past Medical History  Diagnosis Date  . Crohn's disease of small and large intestines   . Hypercholesterolemia   . History of alcohol abuse   . History of substance abuse   .  Depression   . 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 Primary sclerosing cholangitis     ? hepatitis overlap - liver bx x 2 and MRCP  . Allergy     SEASONAL  . Anxiety   . Cataract     BILATERAL/REMOVED  . Hypertension   . Personal history of adenomatous colonic polyps  12/2010, 03/2012    12/2010 - 8 mm serrated adenoma of rectum  . Chronic mesenteric ischemia   . Basal cell carcinoma of lower extremity 06/2011    on left leg  . Dysplastic nevus 06/2011    on right back.      PSH: Past Surgical History  Procedure Laterality Date  . Cataract extraction, bilateral    . Pylonidal cyst removal    . Foot surgery      right  . Percutaneous liver biopsy  2007 and 2008  . Colonoscopy  2001, 05/02/2003, 01/28/11    2012: Right colon Crohn's, rectal polyp  . Esophagogastroduodenoscopy  01/28/11    Normal  . Colonoscopy  03/31/2012    Procedure: COLONOSCOPY;  Surgeon: Jerene Bears, MD;  Location[facility]COPY;  Service: Gastroenterology;  Laterality: N/A;   I have reviewed the [place] and SH and  If appropriate update it with new information. Allergies  Allergen Reactions  . Asacol (Mesalamine)   . Azathioprine   . Mycophenolate Mofetil   . Other     NUTS  . Protein     Agent: Additives and preservatives  . Wheat Bran    Scheduled Meds: . enoxaparin (LOVENOX) injection  40 mg Subcutaneous Q24H  . folic acid  1 mg Oral Daily  . potassium chloride SA  20 mEq Oral BID  . predniSONE  15 mg Oral Daily  . sodium chloride  3 mL Intravenous Q12H  . thiamine  100 mg Oral Daily   Continuous Infusions: . sodium chloride 75 mL/hr at 05/20/12 0640   PRN Meds:.acetaminophen, acetaminophen, clonazePAM, ondansetron (ZOFRAN) IV, ondansetron, senna-docusate, zolpidem    BP 128/80  Pulse 114  Temp(Src) 97.8 F (36.6 C) (Oral)  Resp 20  Ht 6' 1"  (1.854 m)  Wt 59.5 kg (131 lb 2.8 oz)  BMI 17.31 kg/m2  SpO2 98%   PPS:30%   Intake/Output Summary (Last 24 hours) at 05/20/12 1100 Last data filed at 05/20/12 0957  Gross per 24 hour  Intake   1560 ml  Output   1500 ml  Net     60 ml    Physical Exam:  General: chronically ill appearing NAD HEENT:  + temporal muscle wasting, moist buccal membranes Chest:   diminished in bases  CTA CVS:  tachycardic Abdomen:soft NT +BS Ext:  Without edeam Neuro: alert and oriented X3, Patient is able to receive and appreciate medical information as it relates to options to prolong life.  He is able to verbalize the risk and benefits of options.   I believe he has capacity for medical decision making at this time.  Labs: CBC    Component Value Date/Time   WBC 7.6 05/19/2012 0340   RBC 3.72* 05/19/2012 0340   HGB 12.1* 05/19/2012 0340   HCT 35.9* 05/19/2012 0340   PLT 177 05/19/2012 0340   MCV 96.5 05/19/2012 0340   MCH 32.5 05/19/2012 0340   MCHC 33.7 05/19/2012 0340   RDW 14.2 05/19/2012 0340   LYMPHSABS 1.2 05/15/2012 1817   MONOABS 1.3* 05/15/2012 1817   EOSABS 0.0 05/15/2012 1817  BASOSABS 0.0 05/15/2012 1817    BMET    Component Value Date/Time   NA 134* 05/19/2012 0340   K 4.0 05/19/2012 0340   CL 101 05/19/2012 0340   CO2 26 05/19/2012 0340   GLUCOSE 73 05/19/2012 0340   BUN 7 05/19/2012 0340   CREATININE 0.66 05/19/2012 0340   CALCIUM 8.7 05/19/2012 0340   GFRNONAA >90 05/19/2012 0340   GFRAA >90 05/19/2012 0340    CMP     Component Value Date/Time   NA 134* 05/19/2012 0340   K 4.0 05/19/2012 0340   CL 101 05/19/2012 0340   CO2 26 05/19/2012 0340   GLUCOSE 73 05/19/2012 0340   BUN 7 05/19/2012 0340   CREATININE 0.66 05/19/2012 0340   CALCIUM 8.7 05/19/2012 0340   PROT 6.5 05/18/2012 1205   ALBUMIN 2.5* 05/18/2012 1205   AST 35 05/18/2012 1205   ALT 43 05/18/2012 1205   ALKPHOS 164* 05/18/2012 1205   BILITOT 0.4 05/18/2012 1205   GFRNONAA >90 05/19/2012 0340   GFRAA >90 05/19/2012 0340       Time In Time Out Total Time Spent with Patient Total Overall Time  0945 1115 80 min 90 min    Greater than 50%  of this time was spent counseling and coordinating care related to the above assessment and plan.  Wadie Lessen NP  Palliative Medicine Team Team Phone # [phone] Pager [phone]

## 2012-05-20 NOTE — Consult Note (Signed)
[facility] Consultation  Referring Provider:  [facility] Primary Care Physician:  [name], MD Primary Gastroenterologist:  [name]  Reason for Consultation:  End of life discussion in patient with long-standing ileocolonic Crohn's disease, also with mesenteric vascular insufficiency  HPI: [name] is a 47 y.o. male with complicated PMH including but not limited to ileocolonic Crohn's disease, anxiety and depression, severe mesenteric vessel disease , and PSC,  known to [name] who is admitted to the hospital with an intentional nortriptyline overdose.  Palliative care is now involved as [name] does not wish to pursue further treatments for his Crohn's disease. He feels very strongly that he is at the end of his life, does not want to continue with medical intervention including either pharmacologically or surgically, and wants to make himself comfortable in preparation for death.  He has had thorough discussions with his mother and father in this regard and is resolved in his decisions.  He is refusing to eat solid foods do to the abdominal pain that it causes. He is taking very small amounts of water by mouth. He reports with any significant oral intake he has significant and severe diarrhea, which is not new.    Past Medical History  Diagnosis Date  . Crohn's disease of small and large intestines   . Hypercholesterolemia   . History of alcohol abuse   . History of substance abuse   . Depression   . 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 Primary sclerosing cholangitis     ? hepatitis overlap - liver bx x 2 and MRCP  . Allergy     SEASONAL  . Anxiety   . Cataract     BILATERAL/REMOVED  . Hypertension   . Personal history of adenomatous colonic polyps 12/2010, 03/2012    12/2010 - 8 mm serrated adenoma of rectum  . Chronic mesenteric ischemia   . Basal cell carcinoma of lower extremity 06/2011    on left leg  . Dysplastic nevus  06/2011    on right back.     Past Surgical History  Procedure Laterality Date  . Cataract extraction, bilateral    . Pylonidal cyst removal    . Foot surgery      right  . Percutaneous liver biopsy  2007 and 2008  . Colonoscopy  2001, 05/02/2003, 01/28/11    2012: Right colon Crohn's, rectal polyp  . Esophagogastroduodenoscopy  01/28/11    Normal  . Colonoscopy  03/31/2012    Procedure: COLONOSCOPY;  Surgeon: Jerene Bears, MD;  Location: [facility] ENDOSCOPY;  Service: Gastroenterology;  Laterality: N/A;    Prior to Admission medications   Medication Sig Start Date End Date Taking? Authorizing Provider  adalimumab (HUMIRA) 40 MG/0.8ML injection Inject 0.8 mLs (40 mg total) into the skin every 14 (fourteen) days. 04/06/12  Yes Vena Rua, PA-C  ALPRAZolam Duanne Moron) 1 MG tablet Take 0.5 mg by mouth 3 (three) times daily as needed (sleep/anxiety).  04/12/12  Yes [name], MD  escitalopram (LEXAPRO) 20 MG tablet Take 20 mg by mouth daily.   Yes Historical Provider, MD  folic acid (FOLVITE) 1 MG tablet Take 1 mg by mouth daily.   Yes Historical Provider, MD  mirtazapine (REMERON) 30 MG tablet Take 30 mg by mouth at bedtime.   Yes Historical Provider, MD  potassium chloride SA (K-DUR,KLOR-CON) 20 MEQ tablet Take 20 mEq by mouth 2 (two) times daily. 04/28/12  Yes [name],  MD  predniSONE (DELTASONE) 10 MG tablet Take 15 mg by mouth daily.   Yes Historical Provider, MD  thiamine (VITAMIN B-1) 100 MG tablet Take 100 mg by mouth daily.   Yes Historical Provider, MD  zolpidem (AMBIEN) 5 MG tablet Take 5 mg by mouth at bedtime as needed (sleep).   Yes Historical Provider, MD    Current Facility-Administered Medications  Medication Dose Route Frequency Provider Last Rate Last Dose  . 0.9 %  sodium chloride infusion   Intravenous Continuous Geradine Girt, DO 75 mL/hr at 05/20/12 0640    . acetaminophen (TYLENOL) tablet 650 mg  650 mg Oral Q6H PRN Geradine Girt, DO   650 mg at 05/19/12 2112    Or  . acetaminophen (TYLENOL) suppository 650 mg  650 mg Rectal Q6H PRN Geradine Girt, DO      . clonazePAM (KLONOPIN) tablet 1 mg  1 mg Oral Q4H PRN Geradine Girt, DO   1 mg at 05/20/12 0749  . enoxaparin (LOVENOX) injection 40 mg  40 mg Subcutaneous Q24H Geradine Girt, DO   40 mg at 05/19/12 2112  . folic acid (FOLVITE) tablet 1 mg  1 mg Oral Daily Geradine Girt, DO   1 mg at 05/20/12 1126  . ondansetron (ZOFRAN) tablet 4 mg  4 mg Oral Q6H PRN Geradine Girt, DO       Or  . ondansetron (ZOFRAN) injection 4 mg  4 mg Intravenous Q6H PRN Geradine Girt, DO      . potassium chloride SA (K-DUR,KLOR-CON) CR tablet 20 mEq  20 mEq Oral BID Geradine Girt, DO   20 mEq at 05/20/12 1126  . predniSONE (DELTASONE) tablet 15 mg  15 mg Oral Daily Geradine Girt, DO   15 mg at 05/20/12 1126  . senna-docusate (Senokot-S) tablet 1 tablet  1 tablet Oral QHS PRN Geradine Girt, DO      . sodium chloride 0.9 % injection 3 mL  3 mL Intravenous Q12H Jessica U Vann, DO   3 mL at 05/20/12 1127  . thiamine (VITAMIN B-1) tablet 100 mg  100 mg Oral Daily Geradine Girt, DO   100 mg at 05/20/12 1127  . traMADol (ULTRAM) tablet 50 mg  50 mg Oral Q6H PRN Wadie Lessen, NP      . zolpidem (AMBIEN) tablet 5 mg  5 mg Oral QHS PRN Geradine Girt, DO        Allergies as of 05/18/2012 - Review Complete 05/18/2012  Allergen Reaction Noted  . Asacol (mesalamine)  03/09/2011  . Azathioprine  08/07/2010  . Mycophenolate mofetil  08/07/2010  . Other  01/18/2012  . Protein  08/07/2010  . Wheat bran  01/18/2012    Family History  Problem Relation Age of Onset  . Heart disease Father   . Thyroid cancer Father   . Breast cancer Mother   . Stomach cancer Mother   . Colon cancer Neg Hx     History   Social History  . Marital Status: Single    Spouse Name: N/A    Number of Children: 1  . Years of Education: N/A   Occupational History  . Disability     Social History Main Topics  . Smoking status: Former Smoker     Types: Cigarettes    Quit date: 03/28/1997  . Smokeless tobacco: Never Used  . Alcohol Use: No  . Drug Use: No     Comment:  QUIT USING DRUGS IN 1997  . Sexually Active: Not on file   Other Topics Concern  . Not on file   Social History Narrative  . No narrative on file    Review of Systems: As per history of present illness  Physical Exam: Vital signs in last 24 hours: Temp:  [97.8 F (36.6 C)-98.5 F (36.9 C)] 97.8 F (36.6 C) (03/22 0617) Pulse Rate:  [97-118] 114 (03/22 0617) Resp:  [16-22] 20 (03/22 0617) BP: (124-157)/(78-96) 128/80 mmHg (03/22 0617) SpO2:  [98 %-100 %] 98 % (03/22 0617) Last BM Date: 05/16/12 Gen: Very cachectic and ill-appearing male in no acute distress HEENT: Anicteric CV: reg Pulm: CTA Abd: Scaphoid, nonsurgical  Ext: no c/c/e Neuro: nonfocal   Intake/Output from previous day: 03/21 0701 - 03/22 0700 In: 1575 [I.V.:1575] Out: 1100 [Urine:1100] Intake/Output this shift: Total I/O In: 360 [P.O.:60; I.V.:300] Out: 400 [Urine:400]  Lab Results:  Recent Labs  05/18/12 1205 05/19/12 0340  WBC 8.3 7.6  HGB 13.5 12.1*  HCT 40.7 35.9*  PLT 190 177   BMET  Recent Labs  05/18/12 1205 05/19/12 0340  NA 141 134*  K 3.1* 4.0  CL 101 101  CO2 33* 26  GLUCOSE 79 73  BUN 6 7  CREATININE 0.71 0.66  CALCIUM 8.5 8.7   LFT  Recent Labs  05/18/12 1205  PROT 6.5  ALBUMIN 2.5*  AST 35  ALT 43  ALKPHOS 164*  BILITOT 0.4    Previous Endoscopies: Colonoscopy January 2014 -- severe ileocolitis, Crohn's by pathology with possible ischemic component  Greater than 30 minutes spent today with the patient, palliative care nurse, and his mother and father discussing his disease process and plan going forward  Impression/ Recommendations: 47 y.o. male with complicated PMH including but not limited to ileocolonic Crohn's disease, anxiety and depression, severe mesenteric vessel disease , and PSC,  known to [name] who is admitted  to the hospital with an intentional nortriptyline overdose.   1.  End of life/palliative care in pt with severe Crohn's complicated by mesenteric vessel disease -- [name] seems very resolved and his decision to pursue palliative care and end-of-life measures.  The palliative care team has seen him and they agree with transition to a palliative role. We plan to discontinue telemetry monitoring today. [name] does not plan on eating any solid foods and is taking some water. For now he is still receiving IV fluids --[name] is very resolved in his decision, and his mother seems to be as well. His father is having a bit of a hard time reaching a total palliative care approach.  The patient's brother, [name], is planning to come from [place] soon and per [name]'s father, [name] also does not necessarily agree with palliative care at this time --[name] has been seen by psychiatry and they feel he has the capacity to make his medical decisions -- The family has requested a meeting with [name] given his long history in helping to care for [name]. I will see if this is possible tomorrow afternoon   LOS: 2 days   Dustina Scoggin M  05/20/2012, 12:25 PM

## 2012-05-20 NOTE — Progress Notes (Signed)
Spent time with patient after his parents left.  He expressed concerns about 1) "what its going to be like", i.e. What was going to be like as he died, and 2) leaving his parents and putting a burden on them with regard taking care of his house in [place].  He indicated that he does not have a HCPOA but thinks his mother is best suited to take on this responsibility and would support his wishes.  He stated his brother still thinks a cure can be found for his Crohn's and consequently doesn't "really understand what I'm going through".  Pt expressed guilt about not being around to take care of his parents, "I thought I'd be around for them".  He stated that his brother really doesn't understand their parent's state of health but he finds it difficult to bring up the topic.  He asked about a chaplain visit and I placed the request.  Chaplain Richard returnd my page and stated he would be over sometime around 6:00 pm.  He indicated that NP Wadie Lessen had notified him also.

## 2012-05-20 NOTE — Progress Notes (Signed)
Rec'd call from Elsie Lincoln of [facility] as a follow-up to pt's ED admission r/t overdose.  She requested recent VS which I provided.  I explained that he had come to the PCU from 1445 (where she though he was) after having a Goals of Care meeting with a resulting code status change to DNR.  She stated that based on information provided, they would no longer be following this case. She stated information exchange was sanctioned under HIPPA guidelines.

## 2012-05-21 ENCOUNTER — Encounter: Payer: Self-pay | Admitting: Internal Medicine

## 2012-05-21 DIAGNOSIS — K5090 Crohn's disease, unspecified, without complications: Secondary | ICD-10-CM | POA: Diagnosis not present

## 2012-05-21 DIAGNOSIS — F3289 Other specified depressive episodes: Secondary | ICD-10-CM | POA: Diagnosis not present

## 2012-05-21 DIAGNOSIS — T43011A Poisoning by tricyclic antidepressants, accidental (unintentional), initial encounter: Secondary | ICD-10-CM | POA: Diagnosis not present

## 2012-05-21 DIAGNOSIS — F329 Major depressive disorder, single episode, unspecified: Secondary | ICD-10-CM | POA: Diagnosis not present

## 2012-05-21 DIAGNOSIS — R5381 Other malaise: Secondary | ICD-10-CM | POA: Diagnosis not present

## 2012-05-21 DIAGNOSIS — R627 Adult failure to thrive: Secondary | ICD-10-CM | POA: Diagnosis not present

## 2012-05-21 MED ORDER — DEXTROSE 5 % IN LACTATED RINGERS IV BOLUS
1000.0000 mL | Freq: Once | INTRAVENOUS | Status: AC
  Administered 2012-05-21: 1000 mL via INTRAVENOUS

## 2012-05-21 MED ORDER — KCL IN DEXTROSE-NACL 20-5-0.9 MEQ/L-%-% IV SOLN
INTRAVENOUS | Status: DC
  Administered 2012-05-21 – 2012-05-22 (×3): via INTRAVENOUS
  Administered 2012-05-23: 100 mL/h via INTRAVENOUS
  Administered 2012-05-23 – 2012-05-25 (×5): via INTRAVENOUS
  Filled 2012-05-21 (×12): qty 1000

## 2012-05-21 MED ORDER — BIOTENE DRY MOUTH MT LIQD
15.0000 mL | Freq: Two times a day (BID) | OROMUCOSAL | Status: DC
  Administered 2012-05-21 – 2012-05-24 (×6): 15 mL via OROMUCOSAL

## 2012-05-21 MED ORDER — CHLORHEXIDINE GLUCONATE 0.12 % MT SOLN
15.0000 mL | Freq: Two times a day (BID) | OROMUCOSAL | Status: DC
  Administered 2012-05-21 – 2012-05-25 (×7): 15 mL via OROMUCOSAL
  Filled 2012-05-21 (×10): qty 15

## 2012-05-21 NOTE — Progress Notes (Signed)
Chaplain Note: Chaplain and patient had a very pleasant and revelatory conversation.  The patient had issues surrounding his decision to no longer pursue any means of "cure" or prolonging life.  Within the last ten days he attempted a suicide with pills (per patient).  We discussed the difference between allowing oneself to die and taking one's life.  We also discussed and validated his "new-found" clarity and peace, its roots in his decision and perpetuating it.  We discussed purpose and meaning with respect to the life that remains for him now in this physical realm.  We looked at the choice made to decline any further treatment in the light of Jesus avoiding the angry crowds in the beginning of his recorded ministry, and then acquiescing to circumstance and accepting his impending death at the end of his ministry, and yet it was in dying and death that his entire ministry was validated.  This parallel to Jesus' choices was revelatory and confonting to the patient.    We also discussed reconnecting with the church ([employer]) he attended for many years, but not over the past three due to his affliction.  In addition, Chaplain suggested he call his pastor.  Do not know if he has been in touch with the pastor over the three years or just not in Sunday worship.    We harkened back to his AA meetings and learning to make his "loss of control" something progressive as opposed to regressive.  We called upon various of the 12 steps to continue to guide him through these last days of his, however long they may be.  Together we examined our personal experiences and beliefs of afterlife to frame a positive future for him of an eternal nature, and make this time the peaceful passing he desires.   Additionally, we spent a lot of time on forgiveness and the idea of being connected to God, insofar as he felt his suicide attempt messed up that connection.  We spoke of grace and that usually it is [place] who feel  disconnected as opposed to God disconnecting from [place].  The patient stated that he time spent with the Chaplain was affirming and validating.  This Sunday, (tomorrow) he will meet with his brother who lives in [place].  He stated how much he has learned about his dad and the latter's emotional make-up, which has become real and meaningful to him , having lived with his parents over the past few years.  This journey will be continuously revelatory for the patient.  It was inspiring to sit with someone whose attitude isone of learning and openness.  We did talk about his personal "life ministry" not being over yet.  Loann Quill, Chaplain Pager: [phone]

## 2012-05-21 NOTE — Progress Notes (Signed)
Chaplain had long visit with patient earlier in shift.  Pt stated that he found visit helpful.

## 2012-05-21 NOTE — Progress Notes (Addendum)
[facility] Progress Note  Patient Name: [name] Date of Encounter: 05/21/2012, 9:27 AM    Subjective  [name] says he feels like he could go today. Does not want treatment of his problems - "I have suffered enough" Bloody stool this AM Taking some clear liquids only   Objective    Physical Exam: Filed Vitals:   05/21/12 0508  BP: 126/73  Pulse: 103  Temp: 97.8 F (36.6 C)  Resp: 20   General: chronically ill Psych: flat affect    Intake/Output Summary (Last 24 hours) at 05/21/12 6967 Last data filed at 05/21/12 8938  Gross per 24 hour  Intake    600 ml  Output   2925 ml  Net  -2325 ml    Labs:  Recent Labs  05/18/12 1200 05/18/12 1205 05/19/12 0340  NA  --  141 134*  K  --  3.1* 4.0  CL  --  101 101  CO2  --  33* 26  GLUCOSE  --  79 73  BUN  --  6 7  CREATININE  --  0.71 0.66  CALCIUM  --  8.5 8.7  MG 2.2  --   --        Assessment and Plan  1) Crohn's disease 2) Mesenteric ischemia 3) depression 4) Anxiety 5) Malnutrition  6) desire to die, recent suicidal gestures   I explained to [name] that we have more aggressive medical interventions that could be used (TPN). He is not interested. I have read the psychiatry note - cannot help but wonder how much of this is depression talking as opposed to a completely rational wish to die.  I will contact his family and try to arrange a meeting for later this afternoon - perhaps at about 4 PM. SPOKE TO DAD WILL AIM FOR 4 PM MEETING I changes his IVF's to include D% I think he could use some boluses as well - I know he desires palliative care but want him in a bit stronger position for the discussions - will bolus IVF also.    [name], MD, [facility] [phone] (pager) 05/21/2012 9:31 AM     I met with patient, family and Wadie Lessen NP yesterday - 40 minutes after the first 15 minutes earlier in day. [name] thinks he is incurable and has  suffered enough and wants to die. We have decided to pursue second psychiatrist opinion.  I do think TPN and then vascular surgery is a viable option to help him.  [name], MD, [facility] [phone] (pager) 05/22/2012 8:55 AM

## 2012-05-21 NOTE — Progress Notes (Signed)
Progress Note from the Palliative Medicine Team at [facility]: patient is alert and oriented, engaged in conversation with family at bedside to include mother, father, and brother    -Meeting with Dr Carlean Purl scheduled for 4:00  -long conversation had at bedside regarding the options of TPN for nutritional support and reexamine the idea of vascular surgery to correct blow flow to gut and then continued lifelong management of his Chron's disease  -Dr Carlean Purl shared his concern that  depression may be affecting the patient's decision to forego therapy for his disease state      Objective: Allergies  Allergen Reactions  . Asacol (Mesalamine)   . Azathioprine   . Mycophenolate Mofetil   . Other     NUTS  . Protein     Agent: Additives and preservatives  . Wheat Bran    Scheduled Meds: . antiseptic oral rinse  15 mL Mouth Rinse q12n4p  . chlorhexidine  15 mL Mouth Rinse BID  . enoxaparin (LOVENOX) injection  40 mg Subcutaneous Q24H  . folic acid  1 mg Oral Daily  . potassium chloride SA  20 mEq Oral BID  . predniSONE  15 mg Oral Daily  . sodium chloride  3 mL Intravenous Q12H  . thiamine  100 mg Oral Daily   Continuous Infusions: . dextrose 5 % and 0.9 % NaCl with KCl 20 mEq/L 100 mL/hr at 05/21/12 1409   PRN Meds:.acetaminophen, acetaminophen, clonazePAM, ondansetron (ZOFRAN) IV, ondansetron, senna-docusate, traMADol, zolpidem  BP 126/73  Pulse 103  Temp(Src) 97.8 F (36.6 C) (Oral)  Resp 20  Ht 6' 1"  (1.854 m)  Wt 59.5 kg (131 lb 2.8 oz)  BMI 17.31 kg/m2  SpO2 100%     Intake/Output Summary (Last 24 hours) at 05/21/12 1706 Last data filed at 05/21/12 1555  Gross per 24 hour  Intake   1345 ml  Output   2825 ml  Net  -1480 ml      LBM:05-21-12 lg bloody stool per nursing       Physical Exam: General: chronically ill appearing NAD, more alert and engaged HEENT: + temporal muscle wasting, moist buccal membranes  Chest: diminished in bases CTA   CVS: tachycardic  Abdomen:soft NT +BS  Ext: Without edeam  Neuro: weak, strength 4/5 equal all extremities Psych:alert and oriented X3, Patient is able to receive and appreciate medical information as it relates to options to prolong life. He is able to verbalize the risk and benefits of options. He expresses what he values most is quality of life.   Labs: CBC    Component Value Date/Time   WBC 7.6 05/19/2012 0340   RBC 3.72* 05/19/2012 0340   HGB 12.1* 05/19/2012 0340   HCT 35.9* 05/19/2012 0340   PLT 177 05/19/2012 0340   MCV 96.5 05/19/2012 0340   MCH 32.5 05/19/2012 0340   MCHC 33.7 05/19/2012 0340   RDW 14.2 05/19/2012 0340   LYMPHSABS 1.2 05/15/2012 1817   MONOABS 1.3* 05/15/2012 1817   EOSABS 0.0 05/15/2012 1817   BASOSABS 0.0 05/15/2012 1817    BMET    Component Value Date/Time   NA 134* 05/19/2012 0340   K 4.0 05/19/2012 0340   CL 101 05/19/2012 0340   CO2 26 05/19/2012 0340   GLUCOSE 73 05/19/2012 0340   BUN 7 05/19/2012 0340   CREATININE 0.66 05/19/2012 0340   CALCIUM 8.7 05/19/2012 0340   GFRNONAA >90 05/19/2012 0340   GFRAA >90 05/19/2012 0340    CMP  Component Value Date/Time   NA 134* 05/19/2012 0340   K 4.0 05/19/2012 0340   CL 101 05/19/2012 0340   CO2 26 05/19/2012 0340   GLUCOSE 73 05/19/2012 0340   BUN 7 05/19/2012 0340   CREATININE 0.66 05/19/2012 0340   CALCIUM 8.7 05/19/2012 0340   PROT 6.5 05/18/2012 1205   ALBUMIN 2.5* 05/18/2012 1205   AST 35 05/18/2012 1205   ALT 43 05/18/2012 1205   ALKPHOS 164* 05/18/2012 1205   BILITOT 0.4 05/18/2012 1205   GFRNONAA >90 05/19/2012 0340   GFRAA >90 05/19/2012 0340     Assessment and Plan:  1.    Difficult  situation with patient verbalizing wish to forego any further medical interventions to treat presetn illness and allow nature to take its course.  To insure that the patient does indeed have full decision making capacity, Dr Carlean Purl will try to secure  second psychiatrist for evaluation.   Patient states today "I am  more sure today than yesterday that I am making the right decision for myself"  PMT will continue to support holistically    Time In Time Out Total Time Spent with Patient Total Overall Time  1600 1710 60 min 70 min    Greater than 50%  of this time was spent counseling and coordinating care related to the above assessment and plan.  Above discussed with Dr Carter Kitten NP  Palliative Medicine Team Team Phone # [phone] Pager [phone] 1

## 2012-05-21 NOTE — Progress Notes (Signed)
[facility] PROGRESS NOTE  [name] QIH:[id number] DOB: 17-Dec-1965 DOA: 05/18/2012 PCP: [name], MD  Assessment/Plan:  suicide attempt by overdose- followed recommendations by poison control: EKG watching QTc- replaced K+ and Mg ok,  psych recommends no intervention except for palliative care consult -refusing to eat foods- will drink water -d/c/d sitter and suicide precautions  Depression- avoid SSRIs  Crohns disease- GI to see per note on 3/19, does not appear to have an acute flair- but his overall health and disablity related to Crohns is causing him to want to go to hospice and die- has a meeting again today with GI/pallaitive care  hypokalemia repleated   Malnutrition  Code Status: full Family Communication: patient at bedside Disposition Plan: hospice??   Consultants:  GI  palliative care  Procedures:  none  Antibiotics:  none  HPI/Subjective: No new c/o today No acute needs     Objective: Filed Vitals:   05/19/12 1526 05/19/12 2308 05/20/12 0617 05/21/12 0508  BP: 126/82 124/78 128/80 126/73  Pulse: 118 97 114 103  Temp: 98.1 F (36.7 C) 98.4 F (36.9 C) 97.8 F (36.6 C) 97.8 F (36.6 C)  TempSrc: Oral Oral Oral Oral  Resp: 16 20 20 20   Height:      Weight:      SpO2: 98% 99% 98% 100%    Intake/Output Summary (Last 24 hours) at 05/21/12 0810 Last data filed at 05/21/12 0026  Gross per 24 hour  Intake    600 ml  Output   2925 ml  Net  -2325 ml   Filed Weights   05/18/12 1710  Weight: 59.5 kg (131 lb 2.8 oz)    Exam:   General:  Thin, ill appearing male  Cardiovascular: tachy  Respiratory: clear anterior  Abdomen: +BS, soft  Musculoskeletal: moves all 4 extremities  Data Reviewed: Basic Metabolic Panel:  Recent Labs Lab 05/15/12 1817 05/18/12 1200 05/18/12 1205 05/19/12 0340  NA 134*  --  141 134*  K 3.9  --  3.1* 4.0  CL 97  --  101 101  CO2 31  --  33* 26  GLUCOSE 146*  --  79 73  BUN  15  --  6 7  CREATININE 0.87  --  0.71 0.66  CALCIUM 9.1  --  8.5 8.7  MG  --  2.2  --   --    Liver Function Tests:  Recent Labs Lab 05/15/12 1817 05/18/12 1205  AST 39* 35  ALT 50 43  ALKPHOS 181* 164*  BILITOT 0.3 0.4  PROT 6.7 6.5  ALBUMIN 2.7* 2.5*   No results found for this basename: LIPASE, AMYLASE,  in the last 168 hours No results found for this basename: AMMONIA,  in the last 168 hours CBC:  Recent Labs Lab 05/15/12 1817 05/18/12 1205 05/19/12 0340  WBC 14.9* 8.3 7.6  NEUTROABS 12.3*  --   --   HGB 13.9 13.5 12.1*  HCT 42.2 40.7 35.9*  MCV 98.6 96.7 96.5  PLT 231 190 177   Cardiac Enzymes: No results found for this basename: CKTOTAL, CKMB, CKMBINDEX, TROPONINI,  in the last 168 hours BNP (last 3 results) No results found for this basename: PROBNP,  in the last 8760 hours CBG:  Recent Labs Lab 05/18/12 1202  GLUCAP 78    Recent Results (from the past 240 hour(s))  MRSA PCR SCREENING     Status: None   Collection Time    05/18/12  5:27 PM  Result Value Range Status   MRSA by PCR NEGATIVE  NEGATIVE Final   Comment:            The GeneXpert MRSA Assay (FDA     approved for NASAL specimens     only), is one component of a     comprehensive MRSA colonization     surveillance program. It is not     intended to diagnose MRSA     infection nor to guide or     monitor treatment for     MRSA infections.     Studies: No results found.  Scheduled Meds: . enoxaparin (LOVENOX) injection  40 mg Subcutaneous Q24H  . folic acid  1 mg Oral Daily  . potassium chloride SA  20 mEq Oral BID  . predniSONE  15 mg Oral Daily  . sodium chloride  3 mL Intravenous Q12H  . thiamine  100 mg Oral Daily   Continuous Infusions: . sodium chloride 75 mL/hr at 05/20/12 2030    Principal Problem:   Suicide and self-inflicted poisoning by other sedatives and hypnotics Active Problems:   DEPRESSION   CROHN'S DISEASE, LARGE AND SMALL INTESTINES   Overdose    Hypokalemia   Weakness generalized   Adult failure to thrive   Pain, generalized    Time spent: [place] [place] [facility] Pager [phone]. If 7PM-7AM, please contact night-coverage at [email or web address], password [facility] 05/21/2012, 8:10 AM  LOS: 3 days

## 2012-05-22 ENCOUNTER — Encounter: Payer: Self-pay | Admitting: Internal Medicine

## 2012-05-22 ENCOUNTER — Ambulatory Visit: Payer: Medicare Other | Admitting: Psychology

## 2012-05-22 DIAGNOSIS — R627 Adult failure to thrive: Secondary | ICD-10-CM | POA: Diagnosis not present

## 2012-05-22 DIAGNOSIS — F411 Generalized anxiety disorder: Secondary | ICD-10-CM

## 2012-05-22 DIAGNOSIS — F329 Major depressive disorder, single episode, unspecified: Secondary | ICD-10-CM | POA: Diagnosis not present

## 2012-05-22 DIAGNOSIS — K551 Chronic vascular disorders of intestine: Secondary | ICD-10-CM | POA: Diagnosis present

## 2012-05-22 DIAGNOSIS — F3289 Other specified depressive episodes: Secondary | ICD-10-CM | POA: Diagnosis not present

## 2012-05-22 DIAGNOSIS — K8309 Other cholangitis: Secondary | ICD-10-CM

## 2012-05-22 DIAGNOSIS — K5080 Crohn's disease of both small and large intestine without complications: Secondary | ICD-10-CM | POA: Diagnosis not present

## 2012-05-22 DIAGNOSIS — K5090 Crohn's disease, unspecified, without complications: Secondary | ICD-10-CM | POA: Diagnosis not present

## 2012-05-22 DIAGNOSIS — T43011A Poisoning by tricyclic antidepressants, accidental (unintentional), initial encounter: Secondary | ICD-10-CM | POA: Diagnosis not present

## 2012-05-22 NOTE — Progress Notes (Signed)
Patient desired information about Advanced Directives. We had a lengthy talk and it was obvious that he has thought at great length and very deeply about his desires. I gave him a MOST form and went over it with him. He wanted to look over it some more but told me of his desire to have a natural, peaceful death. He does not want CPR or medical interventions that will prolong his life. He expressed his desire for comfort care and told me that he would like to die at a residential hospice. I explored this with him and he told me he has wanted to learn more about hospice care for some time, but others have "persuaded" him to continue care. He said, "I have given up on eating and haven't felt hungry for awhile now." Music, particularly flute music is soothing to him and speaks to his soul. He has made flutes in the past. Presence; listening; support.

## 2012-05-22 NOTE — Consult Note (Signed)
Patient Identification:  [name] Date of Evaluation:  05/22/2012 Reason for Consult:  Referring Provider:   History of Present IllnessCharles [name], 47 yo man lives in separate dwelling at his parent's home. He had taken excessive Klonopin 3 days ago; was at ED and was sent home. His parents found him in am 05/18/12 groggy. He had taken ~ 50 pills of nortriptyline. He was brought to [facility] ED.   He has since discussed his sense of chronic pain, chronic disability and a decision that he can no longer tolerate the thought of living this intolerable existence.  This discussion took place in the presence of both parents.  He indicates that he is not intending to attempt suicide in the hospital but he does wish to be consulted by palliative care.  He is currently on palliative care management.    Past Psychiatric History:Pt reports hx of great anxiety, also depression after developing Chron's Dx symptoms of bloating and diarrhea every day.   Past Medical History:     Past Medical History  Diagnosis Date  . Crohn's disease of small and large intestines   . Hypercholesterolemia   . History of alcohol abuse   . History of substance abuse   . Depression   . 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 Primary sclerosing cholangitis     ? hepatitis overlap - liver bx x 2 and MRCP  . Allergy     SEASONAL  . Anxiety   . Cataract     BILATERAL/REMOVED  . Hypertension   . Personal history of adenomatous colonic polyps 12/2010, 03/2012    12/2010 - 8 mm serrated adenoma of rectum  . Chronic mesenteric ischemia   . Basal cell carcinoma of lower extremity 06/2011    on left leg  . Dysplastic nevus 06/2011    on right back.        Past Surgical History  Procedure Laterality Date  . Cataract extraction, bilateral    . Pylonidal cyst removal    . Foot surgery      right  . Percutaneous liver biopsy  2007 and 2008  . Colonoscopy  2001, 05/02/2003, 01/28/11    2012: Right colon Crohn's,  rectal polyp  . Esophagogastroduodenoscopy  01/28/11    Normal  . Colonoscopy  03/31/2012    Procedure: COLONOSCOPY;  Surgeon: Jerene Bears, MD;  Location: [facility] ENDOSCOPY;  Service: Gastroenterology;  Laterality: N/A;    Allergies:  Allergies  Allergen Reactions  . Asacol (Mesalamine)   . Azathioprine   . Mycophenolate Mofetil   . Other     NUTS  . Protein     Agent: Additives and preservatives  . Wheat Bran     Current Medications:  Prior to Admission medications   Medication Sig Start Date End Date Taking? Authorizing Provider  adalimumab (HUMIRA) 40 MG/0.8ML injection Inject 0.8 mLs (40 mg total) into the skin every 14 (fourteen) days. 04/06/12  Yes Vena Rua, PA-C  ALPRAZolam Duanne Moron) 1 MG tablet Take 0.5 mg by mouth 3 (three) times daily as needed (sleep/anxiety).  04/12/12  Yes [name], MD  escitalopram (LEXAPRO) 20 MG tablet Take 20 mg by mouth daily.   Yes Historical Provider, MD  folic acid (FOLVITE) 1 MG tablet Take 1 mg by mouth daily.   Yes Historical Provider, MD  mirtazapine (REMERON) 30 MG tablet Take 30 mg by mouth at bedtime.   Yes Historical Provider, MD  potassium chloride  SA (K-DUR,KLOR-CON) 20 MEQ tablet Take 20 mEq by mouth 2 (two) times daily. 04/28/12  Yes [name], MD  predniSONE (DELTASONE) 10 MG tablet Take 15 mg by mouth daily.   Yes Historical Provider, MD  thiamine (VITAMIN B-1) 100 MG tablet Take 100 mg by mouth daily.   Yes Historical Provider, MD  zolpidem (AMBIEN) 5 MG tablet Take 5 mg by mouth at bedtime as needed (sleep).   Yes Historical Provider, MD    Social History:    reports that he quit smoking about 15 years ago. His smoking use included Cigarettes. He smoked 0.00 packs per day. He has never used smokeless tobacco. He reports that he does not drink alcohol or use illicit drugs.   Family History:    Family History  Problem Relation Age of Onset  . Heart disease Father   . Thyroid cancer Father   . Breast cancer Mother    . Stomach cancer Mother   . Colon cancer Neg Hx     Mental Status Examination/Evaluation: Objective:  Appearance: Casual, Well Groomed and recently shaved.;undernourished  Eye Contact::  Good  Speech:  Clear and Coherent and Normal Rate  Volume:  Normal  Mood:  euthymic  Affect:  Appropriate and Congruent  Thought Process:  Coherent, Goal Directed, Intact and Logical  Orientation:  Full (Time, Place, and Person)  Thought Content:  resigned to his decision; rejects alternative interventions of care  Suicidal Thoughts:  No  Homicidal Thoughts:  No  Judgement:  Good  Insight:  Good   DIAGNOSIS:   AXIS I Major Depressive Disorder with Suicide attempt due to another medical condition, Generalized anxiety disorder   AXIS II  Deferred  AXIS III See medical notes.  AXIS IV other psychosocial or environmental problems, problems related to social environment and persistent wish to end life; accepts palliative care.  AXIS V 41-50 serious symptoms   Assessment/Plan:  Discussed with Dr. Eliseo Squires, Loann Quill, [place], and 3W LCSW Pt is awake, alert, very neat and conversational.  He says palliative care is what he wants.  He says [name] has offered TPN to restore nutrition to baseline.  Pt rejects option. He declines to return to the chronicity of his illness.  He is asked whether he ever considered surgical resection.  He says no, because Chron's Disease always recurs.  He says he feels well about his decision and knows his family struggles with his decision.  He is asked if he has ever put his requests about advanced directive in writing.  He has not and indicates at this time he would like to do so.   Social worker at unit is informed.  Also Mable Fill, Loann Quill is also notified of patient's wishes.  He is going to refer visit to his colleague who is at [facility] today.  RECOMMENDATION:  1.  Pt has capacity.  2.  Suggest [place] visitation 3.  Pt indicates which to document Advanced Directive  with [place]/Unit LCSW 4.  No further needs unless requested.  Maryruth Eve MD 05/22/2012 2:57 PM

## 2012-05-22 NOTE — Telephone Encounter (Signed)
FYI Dr Carlean Purl

## 2012-05-22 NOTE — Progress Notes (Signed)
I have been able to obtain a second opinion psychiatry consult from Dr. Adele Schilder - he will see the patient either today or tomorrow.  Mr. [name] is aware.

## 2012-05-22 NOTE — Progress Notes (Addendum)
[facility] PROGRESS NOTE  [name] IRW:[id number] DOB: November 10, 1965 DOA: 05/18/2012 PCP: [name], MD  Assessment/Plan:  suicide attempt by overdose- followed recommendations by poison control: EKG watching QTc- replaced K+ and Mg ok,  psych recommends no intervention except for palliative care consult as patient has capacity -refusing to eat foods due to side effects- will drink water -d/c/d sitter and suicide precautions  Depression- avoid SSRIs per psych  Crohns disease- GI saw, does not appear to have an acute flair- but his overall health and disablity related to Crohns is causing him to want to go to hospice and die- met with GI/pallaitve care- GI to arrange for second opinion on capacity  hypokalemia repleated   Malnutrition  Code Status: full Family Communication: patient at bedside Disposition Plan: hospice??   Consultants:  GI  palliative care  Procedures:  none  Antibiotics:  none  HPI/Subjective: Patient slept well last night, walked around and was able to shower- in good spirits today     Objective: Filed Vitals:   05/19/12 2308 05/20/12 0617 05/21/12 0508 05/22/12 0421  BP: 124/78 128/80 126/73 144/94  Pulse: 97 114 103 106  Temp: 98.4 F (36.9 C) 97.8 F (36.6 C) 97.8 F (36.6 C) 98 F (36.7 C)  TempSrc: Oral Oral Oral Oral  Resp: 20 20 20 18   Height:      Weight:      SpO2: 99% 98% 100% 100%    Intake/Output Summary (Last 24 hours) at 05/22/12 0816 Last data filed at 05/22/12 0600  Gross per 24 hour  Intake   2848 ml  Output    850 ml  Net   1998 ml   Filed Weights   05/18/12 1710  Weight: 59.5 kg (131 lb 2.8 oz)    Exam:   General:  Thin, ill appearing male  Cardiovascular: tachy  Respiratory: clear anterior  Abdomen: +BS, soft  Musculoskeletal: moves all 4 extremities  Data Reviewed: Basic Metabolic Panel:  Recent Labs Lab 05/15/12 1817 05/18/12 1200 05/18/12 1205 05/19/12 0340  NA  134*  --  141 134*  K 3.9  --  3.1* 4.0  CL 97  --  101 101  CO2 31  --  33* 26  GLUCOSE 146*  --  79 73  BUN 15  --  6 7  CREATININE 0.87  --  0.71 0.66  CALCIUM 9.1  --  8.5 8.7  MG  --  2.2  --   --    Liver Function Tests:  Recent Labs Lab 05/15/12 1817 05/18/12 1205  AST 39* 35  ALT 50 43  ALKPHOS 181* 164*  BILITOT 0.3 0.4  PROT 6.7 6.5  ALBUMIN 2.7* 2.5*   No results found for this basename: LIPASE, AMYLASE,  in the last 168 hours No results found for this basename: AMMONIA,  in the last 168 hours CBC:  Recent Labs Lab 05/15/12 1817 05/18/12 1205 05/19/12 0340  WBC 14.9* 8.3 7.6  NEUTROABS 12.3*  --   --   HGB 13.9 13.5 12.1*  HCT 42.2 40.7 35.9*  MCV 98.6 96.7 96.5  PLT 231 190 177   Cardiac Enzymes: No results found for this basename: CKTOTAL, CKMB, CKMBINDEX, TROPONINI,  in the last 168 hours BNP (last 3 results) No results found for this basename: PROBNP,  in the last 8760 hours CBG:  Recent Labs Lab 05/18/12 1202  GLUCAP 78    Recent Results (from the past 240 hour(s))  MRSA PCR SCREENING  Status: None   Collection Time    05/18/12  5:27 PM      Result Value Range Status   MRSA by PCR NEGATIVE  NEGATIVE Final   Comment:            The GeneXpert MRSA Assay (FDA     approved for NASAL specimens     only), is one component of a     comprehensive MRSA colonization     surveillance program. It is not     intended to diagnose MRSA     infection nor to guide or     monitor treatment for     MRSA infections.     Studies: No results found.  Scheduled Meds: . antiseptic oral rinse  15 mL Mouth Rinse q12n4p  . chlorhexidine  15 mL Mouth Rinse BID  . enoxaparin (LOVENOX) injection  40 mg Subcutaneous Q24H  . folic acid  1 mg Oral Daily  . potassium chloride SA  20 mEq Oral BID  . predniSONE  15 mg Oral Daily  . sodium chloride  3 mL Intravenous Q12H  . thiamine  100 mg Oral Daily   Continuous Infusions: . dextrose 5 % and 0.9 %  NaCl with KCl 20 mEq/L 100 mL/hr at 05/21/12 1409    Principal Problem:   Suicide and self-inflicted poisoning by other sedatives and hypnotics Active Problems:   DEPRESSION   CROHN'S DISEASE, LARGE AND SMALL INTESTINES   Overdose   Hypokalemia   Weakness generalized   Adult failure to thrive   Pain, generalized    Time spent: [place], [place] [facility] Pager [phone]. If 7PM-7AM, please contact night-coverage at [email or web address], password [facility] 05/22/2012, 8:16 AM  LOS: 4 days

## 2012-05-23 DIAGNOSIS — K8309 Other cholangitis: Secondary | ICD-10-CM | POA: Diagnosis not present

## 2012-05-23 DIAGNOSIS — K5090 Crohn's disease, unspecified, without complications: Secondary | ICD-10-CM | POA: Diagnosis not present

## 2012-05-23 DIAGNOSIS — F329 Major depressive disorder, single episode, unspecified: Secondary | ICD-10-CM | POA: Diagnosis not present

## 2012-05-23 DIAGNOSIS — F411 Generalized anxiety disorder: Secondary | ICD-10-CM | POA: Diagnosis not present

## 2012-05-23 DIAGNOSIS — T43011A Poisoning by tricyclic antidepressants, accidental (unintentional), initial encounter: Secondary | ICD-10-CM | POA: Diagnosis not present

## 2012-05-23 DIAGNOSIS — R627 Adult failure to thrive: Secondary | ICD-10-CM | POA: Diagnosis not present

## 2012-05-23 DIAGNOSIS — F339 Major depressive disorder, recurrent, unspecified: Secondary | ICD-10-CM

## 2012-05-23 DIAGNOSIS — K5080 Crohn's disease of both small and large intestine without complications: Secondary | ICD-10-CM | POA: Diagnosis not present

## 2012-05-23 DIAGNOSIS — R5381 Other malaise: Secondary | ICD-10-CM | POA: Diagnosis not present

## 2012-05-23 DIAGNOSIS — R52 Pain, unspecified: Secondary | ICD-10-CM | POA: Diagnosis not present

## 2012-05-23 DIAGNOSIS — F3289 Other specified depressive episodes: Secondary | ICD-10-CM | POA: Diagnosis not present

## 2012-05-23 MED ORDER — PREDNISONE 10 MG PO TABS
10.0000 mg | ORAL_TABLET | Freq: Every day | ORAL | Status: DC
  Administered 2012-05-23 – 2012-05-25 (×3): 10 mg via ORAL
  Filled 2012-05-23 (×2): qty 1

## 2012-05-23 NOTE — Progress Notes (Signed)
Patient Identification:  [name] Date of Evaluation:  05/23/2012   History of Present Illness: Patient is 47 year old Caucasian male.  Second opinion was requested by his physician.  Patient has taken excessive Klonopin few days ago.  Patient admitted it was a suicidal attempt.  However patient denies any suicidal thoughts anymore.  He has multiple physical problems and issues.  He is aware about his prognosis and treatment option.  Patient admitted some time feeling burden to his family .  Patient does not want to kill himself however he wants nature to take his course of life.  He admitted chronic depression do to his physical illness.  He also endorse increase depression since Thanksgiving since he has more physical symptoms.  Patient denies any hallucination, delusion, paranoia, anger, mood swing, or any active suicidal or homicidal thoughts.  He likes to be managed with palliative care.   Past Psychiatric History: Patient denies any previous history of inpatient treatment.  He admitted is struggling with her depression for a long time.  He has seen therapist at Dr. Tomasita Crumble [facility]'s office.  He admitted one suicidal attempt a few days ago when he took [place].  Patient denies any history of paranoia, hallucination or aggression. He admitted long history of depression and anxiety soon after he developed Crohn's disease.   Past Medical History:     Past Medical History  Diagnosis Date  . Crohn's disease of small and large intestines   . Hypercholesterolemia   . History of alcohol abuse   . History of substance abuse   . Depression   . 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 Primary sclerosing cholangitis     ? hepatitis overlap - liver bx x 2 and MRCP  . Allergy     SEASONAL  . Anxiety   . Cataract     BILATERAL/REMOVED  . Hypertension   . Personal history of adenomatous colonic polyps 12/2010, 03/2012    12/2010 - 8 mm serrated adenoma of rectum  . Chronic mesenteric  ischemia   . Basal cell carcinoma of lower extremity 06/2011    on left leg  . Dysplastic nevus 06/2011    on right back.        Past Surgical History  Procedure Laterality Date  . Cataract extraction, bilateral    . Pylonidal cyst removal    . Foot surgery      right  . Percutaneous liver biopsy  2007 and 2008  . Colonoscopy  2001, 05/02/2003, 01/28/11    2012: Right colon Crohn's, rectal polyp  . Esophagogastroduodenoscopy  01/28/11    Normal  . Colonoscopy  03/31/2012    Procedure: COLONOSCOPY;  Surgeon: Jerene Bears, MD;  Location: [facility] ENDOSCOPY;  Service: Gastroenterology;  Laterality: N/A;    Allergies:  Allergies  Allergen Reactions  . Asacol (Mesalamine)   . Azathioprine   . Mycophenolate Mofetil   . Other     NUTS  . Protein     Agent: Additives and preservatives  . Wheat Bran     Current Medications:  Prior to Admission medications   Medication Sig Start Date End Date Taking? Authorizing Provider  adalimumab (HUMIRA) 40 MG/0.8ML injection Inject 0.8 mLs (40 mg total) into the skin every 14 (fourteen) days. 04/06/12  Yes Vena Rua, PA-C  ALPRAZolam Duanne Moron) 1 MG tablet Take 0.5 mg by mouth 3 (three) times daily as needed (sleep/anxiety).  04/12/12  Yes [name], MD  escitalopram (  LEXAPRO) 20 MG tablet Take 20 mg by mouth daily.   Yes Historical Provider, MD  folic acid (FOLVITE) 1 MG tablet Take 1 mg by mouth daily.   Yes Historical Provider, MD  mirtazapine (REMERON) 30 MG tablet Take 30 mg by mouth at bedtime.   Yes Historical Provider, MD  potassium chloride SA (K-DUR,KLOR-CON) 20 MEQ tablet Take 20 mEq by mouth 2 (two) times daily. 04/28/12  Yes [name], MD  predniSONE (DELTASONE) 10 MG tablet Take 15 mg by mouth daily.   Yes Historical Provider, MD  thiamine (VITAMIN B-1) 100 MG tablet Take 100 mg by mouth daily.   Yes Historical Provider, MD  zolpidem (AMBIEN) 5 MG tablet Take 5 mg by mouth at bedtime as needed (sleep).   Yes Historical Provider,  MD    Social History:    reports that he quit smoking about 15 years ago. His smoking use included Cigarettes. He smoked 0.00 packs per day. He has never used smokeless tobacco. He reports that he does not drink alcohol or use illicit drugs.   Family History:    Family History  Problem Relation Age of Onset  . Heart disease Father   . Thyroid cancer Father   . Breast cancer Mother   . Stomach cancer Mother   . Colon cancer Neg Hx     Mental Status Examination/Evaluation: Patient is a middle-aged man who is fairly groomed but undernourished.  He maintained good eye contact.  His his speech is slow but clear and coherent.  His thought processes slow but logical linear and goal-directed.  He is relevant in conversation.  He denies any suicidal thoughts at this time.  He denies any auditory or visual hallucination.  He denies any homicidal thoughts.  There were no paranoia delusions obsession present at this time.  His psychomotor activity is slightly decreased.  He described his mood is okay and his affect is constricted.  He's alert and oriented x3.  There were no tremors or shakes present at this time.  His insight judgment and impulse control is okay.   DIAGNOSIS:   AXIS I   Maj. depressive disorder , recurrent .  Anxiety disorder NOS , depressive disorder due to general medical condition   AXIS II  Deffered  AXIS III See medical notes.  AXIS IV problems with access to health care services and problems with primary support group  AXIS V 51-60 moderate symptoms     Assessment/Plan: At this time patient is involved and his treatment plan which is palliative care.  The patient is not psychotic delusional or paranoid.  He suffers from chronic depression.  He denies any suicidal thoughts at this time.  Patient has capacity to make his own decision.  I agreed to Dr. Evern Bio opinion.

## 2012-05-23 NOTE — Progress Notes (Signed)
Clinical Social Work Progress Note PSYCHIATRY SERVICE LINE 05/23/2012  Patient:  [name]  Account:  [id number]  [place] Date:  05/18/2012  Clinical Social Worker:  Sindy Messing, LCSW  Date/Time:  05/23/2012 04:00 PM  Review of Patient  Overall Medical Condition:   Patient reports he plans to dc tomorrow or Thursday.   Participation Level:  Active  Participation Quality  Appropriate   Other Participation Quality:   Patient engaged and interacted throughout session   Affect  Appropriate   Cognitive  Appropriate   Reaction to Medications/Concerns:   None reported   Modes of Intervention  Support   Summary of Progress/Plan at Discharge   CSW met with patient and family at bedside. Patient sitting up in bed and talking with parents. Patient agreeable for family to stay during session.    Patient reports that he is feeling good about his decisions and is happy about having a plan regarding dc. Patient reports he plans to dc home with his parents and to wait [facility] a bed at [facility]. Patient's parents are agreeable to plan and had questions regarding process of dc.    Patient reports that this hospitalization has been a positive stay for him. Patient reports that he is grateful for the support from hospital staff and MDs to assist him with his desires. Patient reports that he wants to remain comfortable and feels he is making the best decisions for himself. Parents were more involved during this session and agree that they support patient's decisions and feel he is making the right decisions for himself.    Patient engaged with appropriate eye contact. Patient had somewhat of a flat affect but able to participate in session. Patient denies that depression has any affect on his ability to make decisions. CSW will continue to follow to offer support to patient and family and to assist as needed.

## 2012-05-23 NOTE — Progress Notes (Signed)
[facility] Progress Note  Patient Name: [name] Date of Encounter: 05/23/2012, 12:27 PM    Subjective  Making more definitive palliative care plans. Dr. Adele [name] concurred with Dr. Evern [name] re: capacity to make decisions.   Objective    Physical Exam: Filed Vitals:   05/23/12 0612  BP: 117/80  Pulse: 91  Temp: 97.8 F (36.6 C)  Resp: 16   General: chronically ill Psych: slightly flat       Assessment and Plan  1) Crohn's disease  2) Mesenteric ischemia  3) depression  4) Anxiety  5) Malnutrition 6). Hospice  [name] will be going forward with palliative care and hospice it appears. He wants me to explain what the vascular surgery options would be like to his family - will do that later.  Reducing prednisone to 10 mg daily at his request.    [name] Mayer, MD, [facility] [phone] (pager) 05/23/2012 12:29 PM

## 2012-05-23 NOTE — Progress Notes (Signed)
[facility] PROGRESS NOTE  [name] GYF:[id number] DOB: 27-Jun-1965 DOA: 05/18/2012 PCP: [name] Cruel, MD  Assessment/Plan:  suicide attempt by overdose- followed recommendations by poison control:  psych recommends no intervention except for palliative care consult as patient has capacity and those are his wished -d/c/d sitter and suicide precautions  Depression- patient has capacity  Crohns disease- GI saw, does not appear to have an acute flair- but his overall health and disablity related to Crohns is causing him to want to go to hospice and die- met with GI/pallaitve care- GI to arrange for second opinion on capacity through psych  Hypokalemia: repleated   Malnutrition  Code Status: full Family Communication: patient at bedside Disposition Plan: hospice??   Consultants:  GI  palliative care  psych  Procedures:  none  Antibiotics:  none  HPI/Subjective: No new c/o- willing to speak with second psych doctor     Objective: Filed Vitals:   05/20/12 0617 05/21/12 0508 05/22/12 0421 05/23/12 0612  BP: 128/80 126/73 144/94 117/80  Pulse: 114 103 106 91  Temp: 97.8 F (36.6 C) 97.8 F (36.6 C) 98 F (36.7 C) 97.8 F (36.6 C)  TempSrc: Oral Oral Oral Oral  Resp: 20 20 18 16   Height:      Weight:      SpO2: 98% 100% 100% 100%    Intake/Output Summary (Last 24 hours) at 05/23/12 0811 Last data filed at 05/23/12 9163  Gross per 24 hour  Intake      0 ml  Output      1 ml  Net     -1 ml   Filed Weights   05/18/12 1710  Weight: 59.5 kg (131 lb 2.8 oz)    Exam:   General:  Thin, ill appearing male  Cardiovascular: tachy  Respiratory: clear anterior  Abdomen: +BS, soft  Musculoskeletal: moves all 4 extremities  Data Reviewed: Basic Metabolic Panel:  Recent Labs Lab 05/18/12 1200 05/18/12 1205 05/19/12 0340  NA  --  141 134*  K  --  3.1* 4.0  CL  --  101 101  CO2  --  33* 26  GLUCOSE  --  79 73  BUN  --  6 7   CREATININE  --  0.71 0.66  CALCIUM  --  8.5 8.7  MG 2.2  --   --    Liver Function Tests:  Recent Labs Lab 05/18/12 1205  AST 35  ALT 43  ALKPHOS 164*  BILITOT 0.4  PROT 6.5  ALBUMIN 2.5*   No results found for this basename: LIPASE, AMYLASE,  in the last 168 hours No results found for this basename: AMMONIA,  in the last 168 hours CBC:  Recent Labs Lab 05/18/12 1205 05/19/12 0340  WBC 8.3 7.6  HGB 13.5 12.1*  HCT 40.7 35.9*  MCV 96.7 96.5  PLT 190 177   Cardiac Enzymes: No results found for this basename: CKTOTAL, CKMB, CKMBINDEX, TROPONINI,  in the last 168 hours BNP (last 3 results) No results found for this basename: PROBNP,  in the last 8760 hours CBG:  Recent Labs Lab 05/18/12 1202  GLUCAP 78    Recent Results (from the past 240 hour(s))  MRSA PCR SCREENING     Status: None   Collection Time    05/18/12  5:27 PM      Result Value Range Status   MRSA by PCR NEGATIVE  NEGATIVE Final   Comment:  The GeneXpert MRSA Assay (FDA     approved for NASAL specimens     only), is one component of a     comprehensive MRSA colonization     surveillance program. It is not     intended to diagnose MRSA     infection nor to guide or     monitor treatment for     MRSA infections.     Studies: No results found.  Scheduled Meds: . antiseptic oral rinse  15 mL Mouth Rinse q12n4p  . chlorhexidine  15 mL Mouth Rinse BID  . enoxaparin (LOVENOX) injection  40 mg Subcutaneous Q24H  . folic acid  1 mg Oral Daily  . potassium chloride SA  20 mEq Oral BID  . predniSONE  15 mg Oral Daily  . sodium chloride  3 mL Intravenous Q12H  . thiamine  100 mg Oral Daily   Continuous Infusions: . dextrose 5 % and 0.9 % NaCl with KCl 20 mEq/L 100 mL/hr at 05/23/12 0456    Principal Problem:   Suicide and self-inflicted poisoning by other sedatives and hypnotics Active Problems:   DEPRESSION   CROHN'S DISEASE, LARGE AND SMALL INTESTINES   Overdose    Hypokalemia   Weakness generalized   Adult failure to thrive   Pain, generalized   Chronic mesenteric ischemia    Time spent: [place], [place] [facility] Pager [phone]. If 7PM-7AM, please contact night-coverage at [email or web address], password [facility] 05/23/2012, 8:11 AM  LOS: 5 days

## 2012-05-23 NOTE — Progress Notes (Signed)
Progress Note from the Palliative Medicine Team at [facility]: patient is alert and oriented, fully engaged in conversation regarding his situation.  Shares experience with second psychiatrist's evaluation.  Shares feelings of "relief"  that he is following his path and not feeling responsibly to doing what his family wanted and expected of him.  Expresses gratitude to general understanding of his overall suffering and understanding of his choices.     MOST form completed  Objective: Allergies  Allergen Reactions  . Asacol (Mesalamine)   . Azathioprine   . Mycophenolate Mofetil   . Other     NUTS  . Protein     Agent: Additives and preservatives  . Wheat Bran    Scheduled Meds: . antiseptic oral rinse  15 mL Mouth Rinse q12n4p  . chlorhexidine  15 mL Mouth Rinse BID  . enoxaparin (LOVENOX) injection  40 mg Subcutaneous Q24H  . folic acid  1 mg Oral Daily  . potassium chloride SA  20 mEq Oral BID  . [START ON 05/24/2012] predniSONE  10 mg Oral Daily  . sodium chloride  3 mL Intravenous Q12H  . thiamine  100 mg Oral Daily   Continuous Infusions: . dextrose 5 % and 0.9 % NaCl with KCl 20 mEq/L 100 mL/hr (05/23/12 1435)   PRN Meds:.acetaminophen, acetaminophen, clonazePAM, ondansetron (ZOFRAN) IV, ondansetron, senna-docusate, traMADol, zolpidem  BP 117/80  Pulse 91  Temp(Src) 97.8 F (36.6 C) (Oral)  Resp 16  Ht 6' 1"  (1.854 m)  Wt 59.5 kg (131 lb 2.8 oz)  BMI 17.31 kg/m2  SpO2 100%   PPS:40 %   Pain Score:denies   Intake/Output Summary (Last 24 hours) at 05/23/12 1628 Last data filed at 05/23/12 1100  Gross per 24 hour  Intake      0 ml  Output      3 ml  Net     -3 ml      LBM: 05-13-12     Physical Exam:  General: chronically ill appearing, NAD, cachectic  HEENT:  + temporal muscle wasting, moist buccal membranes no exudate Chest:   CTA CVS: RRR Abdomen: flat NT +BS Ext:  + muscle atrophy, no edema Neuro: weak, moves all 4  extremities  Labs: CBC    Component Value Date/Time   WBC 7.6 05/19/2012 0340   RBC 3.72* 05/19/2012 0340   HGB 12.1* 05/19/2012 0340   HCT 35.9* 05/19/2012 0340   PLT 177 05/19/2012 0340   MCV 96.5 05/19/2012 0340   MCH 32.5 05/19/2012 0340   MCHC 33.7 05/19/2012 0340   RDW 14.2 05/19/2012 0340   LYMPHSABS 1.2 05/15/2012 1817   MONOABS 1.3* 05/15/2012 1817   EOSABS 0.0 05/15/2012 1817   BASOSABS 0.0 05/15/2012 1817    BMET    Component Value Date/Time   NA 134* 05/19/2012 0340   K 4.0 05/19/2012 0340   CL 101 05/19/2012 0340   CO2 26 05/19/2012 0340   GLUCOSE 73 05/19/2012 0340   BUN 7 05/19/2012 0340   CREATININE 0.66 05/19/2012 0340   CALCIUM 8.7 05/19/2012 0340   GFRNONAA >90 05/19/2012 0340   GFRAA >90 05/19/2012 0340    CMP     Component Value Date/Time   NA 134* 05/19/2012 0340   K 4.0 05/19/2012 0340   CL 101 05/19/2012 0340   CO2 26 05/19/2012 0340   GLUCOSE 73 05/19/2012 0340   BUN 7 05/19/2012 0340   CREATININE 0.66 05/19/2012 0340   CALCIUM 8.7 05/19/2012 0340  PROT 6.5 05/18/2012 1205   ALBUMIN 2.5* 05/18/2012 1205   AST 35 05/18/2012 1205   ALT 43 05/18/2012 1205   ALKPHOS 164* 05/18/2012 1205   BILITOT 0.4 05/18/2012 1205   GFRNONAA >90 05/19/2012 0340   GFRAA >90 05/19/2012 0340      Assessment and Plan: 1. Code Status: DNR/DNI 2. Symptom Control: Pain: Ultram previously written ordered 3. Psycho/Social: Emotional support offered, continued conversation regarding his decision for a comfort path allowing nature to take its course 4. Spiritual  Chaplain services involved 5. Disposition: Patient spoke with family about going home for a short time before transitioning to a residential hospice.  Parents agree and patient is hopeful for transition home, hopeful for Thursday, will write for choice.     Time In Time Out Total Time Spent with Patient Total Overall Time  1115 1215 60 min 60 min    Greater than 50%  of this time was spent counseling and coordinating care  related to the above assessment and plan.  Wadie Lessen NP  Palliative Medicine Team Team Phone # [phone] Pager [phone]  Discussed with Dr Eliseo Squires and Dr Carlean Purl 1

## 2012-05-24 ENCOUNTER — Telehealth: Payer: Self-pay

## 2012-05-24 DIAGNOSIS — R627 Adult failure to thrive: Secondary | ICD-10-CM | POA: Diagnosis not present

## 2012-05-24 DIAGNOSIS — T43011A Poisoning by tricyclic antidepressants, accidental (unintentional), initial encounter: Secondary | ICD-10-CM | POA: Diagnosis not present

## 2012-05-24 DIAGNOSIS — F329 Major depressive disorder, single episode, unspecified: Secondary | ICD-10-CM | POA: Diagnosis not present

## 2012-05-24 DIAGNOSIS — K551 Chronic vascular disorders of intestine: Secondary | ICD-10-CM | POA: Diagnosis not present

## 2012-05-24 DIAGNOSIS — F3289 Other specified depressive episodes: Secondary | ICD-10-CM | POA: Diagnosis not present

## 2012-05-24 MED ORDER — ESCITALOPRAM OXALATE 20 MG PO TABS: 20.0000 mg | ORAL_TABLET | Freq: Every day | ORAL | Status: DC

## 2012-05-24 MED ORDER — ALPRAZOLAM 1 MG PO TABS: 0.5000 mg | ORAL_TABLET | Freq: Three times a day (TID) | ORAL | Status: DC | PRN

## 2012-05-24 MED ORDER — MIRTAZAPINE 30 MG PO TABS: 30.0000 mg | ORAL_TABLET | Freq: Every day | ORAL | Status: DC

## 2012-05-24 MED ORDER — SENNOSIDES-DOCUSATE SODIUM 8.6-50 MG PO TABS: 1.0000 | ORAL_TABLET | Freq: Every evening | ORAL | Status: DC | PRN

## 2012-05-24 NOTE — Progress Notes (Signed)
[facility] PROGRESS NOTE  [name] OFH:[id number] DOB: 1966/02/27 DOA: 05/18/2012 PCP: [name], MD  Brief narrative: 47 y.o. male with past medical history of Crohn's disease, depression who presented to [facility] ED 05/18/2012 after he swallowed a 40-50 pills nortriptyline claiming he was under a lot of stress. He was brought by family due to to lethargy. In ED, patient remained very lethargic, urine drug screen was negative. Patient was observed until he is mental status is back to baseline. Palliative care was consulted for goals of care. Patient reported he feels he is at the end of his life and does not want to continue with medical intervention including surgeries.  Assessment/Plan:  Principal problem: Suicide attempt by overdose  Overdose with  Nortriptyline.  Patient is at his baseline mental status, oriented to time, place and person  Active problems: Major depressive disorder  Per psychiatry patient has capacity to make decisions about his medical treatments, choices  Suicide sitter discontinued Crohn's disease  Per GI, patient does not appear to have acute flair.  Hypokalemia  Repleted   Code Status: DNR/DNI Family Communication: no family at bedside Disposition Plan: home in am  Leisa Lenz, MD  [facility] Pager [phone]  If 7PM-7AM, please contact night-coverage [email or web address] Password [facility] 05/24/2012, 12:35 PM   LOS: 6 days   Consultants:  GI   Palliative care   Psych Procedures:  None  Antibiotics:  None   HPI/Subjective: No acute overnight events.  Objective: Filed Vitals:   05/21/12 1583 05/22/12 0421 05/23/12 0612 05/24/12 0611  BP: 126/73 144/94 117/80 104/68  Pulse: 103 106 91 93  Temp: 97.8 F (36.6 C) 98 F (36.7 C) 97.8 F (36.6 C) 97.9 F (36.6 C)  TempSrc: Oral Oral Oral Oral  Resp: 20 18 16 20   Height:      Weight:      SpO2: 100% 100% 100% 100%   No intake or output data in the 24 hours ending  05/24/12 1235  Exam:   General:  Pt is alert, follows commands appropriately, not in acute distress  Cardiovascular: Regular rate and rhythm, S1/S2, no murmurs, no rubs, no gallops  Respiratory: Clear to auscultation bilaterally, no wheezing, no crackles, no rhonchi  Abdomen: Soft, non tender, non distended, bowel sounds present, no guarding  Extremities: No edema, pulses DP and PT palpable bilaterally  Neuro: Grossly nonfocal  Data Reviewed: Basic Metabolic Panel:  Recent Labs Lab 05/18/12 1200 05/18/12 1205 05/19/12 0340  NA  --  141 134*  K  --  3.1* 4.0  CL  --  101 101  CO2  --  33* 26  GLUCOSE  --  79 73  BUN  --  6 7  CREATININE  --  0.71 0.66  CALCIUM  --  8.5 8.7  MG 2.2  --   --    Liver Function Tests:  Recent Labs Lab 05/18/12 1205  AST 35  ALT 43  ALKPHOS 164*  BILITOT 0.4  PROT 6.5  ALBUMIN 2.5*   CBC:  Recent Labs Lab 05/18/12 1205 05/19/12 0340  WBC 8.3 7.6  HGB 13.5 12.1*  HCT 40.7 35.9*  MCV 96.7 96.5  PLT 190 177   CBG:  Recent Labs Lab 05/18/12 1202  GLUCAP 78    MRSA PCR SCREENING     Status: None   Collection Time    05/18/12  5:27 PM      Result Value Range Status   MRSA by PCR NEGATIVE  NEGATIVE  Final     Studies: No results found.  Scheduled Meds: . enoxaparin (LOVENOX)   40 mg Subcutaneous Q24H  . predniSONE  10 mg Oral Daily   Continuous Infusions: . dextrose 5 % and 0.9 % NaCl with KCl 20 mEq/L 100 mL/hr at 05/24/12 1043

## 2012-05-24 NOTE — Telephone Encounter (Signed)
Dr Vena Austria with hospice called to see if you would be willing to be the attending for the pt after discharge tomorrow.  She says that their MD do symptom management.

## 2012-05-24 NOTE — Progress Notes (Signed)
Spoke with pt and parents at bedside concerning Home with Hospice.  Pt selected [facility] and Palliative Care. Referral given to in house rep Lesleigh Noe, RN, NP.

## 2012-05-24 NOTE — Progress Notes (Signed)
Notified by  M Pearson,CMRN, patient and family request services of Hospcie and [facility]) after discharge.  Patient information reviewed with Dr Alferd Patee, [place] Director hospice eligible with dx: Crohn's Disease .  Spoke with patient and mother [name] and father [name] at bedside to initiate education related to hospice services, philosophy and team approach to care; they voiced good understanding of information provided.   [name] shared that his wish was to transition, when possible, to [facility]; however he wants to go home for a few days first.  Per discussion plan is for discharge tomorrow to parents home : [address] [place] Per patient and father-patient to transport home by family personal vehicle   DME needs were discussed; patient declines a hospital bed at this time; requests a 3n1 BSC and a light weight wheelchair to be delivered to the room so parents can take these items home at discharge  Dr Silvano Rusk will be physician attending with [facility]; patient's pharmacy is [facility] on [address] Initial paperwork faxed to [facility]  Please notify [facility] when patient is ready to leave unit at d/c call [phone] (or [phone] if after 5 pm);  [facility] information and contact numbers also given to patient and parents during visit.   Above information shared with M. Pearson, CMRN and [place] representative Please call with any questions or concerns   Danton Sewer, RN 05/24/2012, 3:51 PM [facility] Palliative Medicine Team RN Liaison [phone]

## 2012-05-24 NOTE — Telephone Encounter (Signed)
[name] has been notified

## 2012-05-24 NOTE — Telephone Encounter (Signed)
That will be ok

## 2012-05-24 NOTE — Progress Notes (Signed)
Follow up. Patient happy about being heard and feels peace about decisions made. He is deeply spiritual and feels that he has already begun his journey of transition. We talked about music, angels, and peace. Music is very important to him and helps him connect to his heart and stirs his faith. Listening; prayer; presence.

## 2012-05-25 DIAGNOSIS — K551 Chronic vascular disorders of intestine: Secondary | ICD-10-CM | POA: Diagnosis not present

## 2012-05-25 DIAGNOSIS — T43011A Poisoning by tricyclic antidepressants, accidental (unintentional), initial encounter: Secondary | ICD-10-CM | POA: Diagnosis not present

## 2012-05-25 DIAGNOSIS — R627 Adult failure to thrive: Secondary | ICD-10-CM | POA: Diagnosis not present

## 2012-05-25 DIAGNOSIS — R52 Pain, unspecified: Secondary | ICD-10-CM | POA: Diagnosis not present

## 2012-05-25 DIAGNOSIS — K5090 Crohn's disease, unspecified, without complications: Secondary | ICD-10-CM | POA: Diagnosis not present

## 2012-05-25 MED ORDER — PREDNISONE 10 MG PO TABS: 10.0000 mg | ORAL_TABLET | Freq: Two times a day (BID) | ORAL | Status: DC

## 2012-05-25 MED ORDER — TRAMADOL HCL 50 MG PO TABS: 50.0000 mg | ORAL_TABLET | Freq: Four times a day (QID) | ORAL | Status: DC | PRN

## 2012-05-25 NOTE — Progress Notes (Signed)
Clinical Social Work  Per chart review, patient to [facility] home today with hospice. CSW is signing off but available if needed.  [place], [place] [phone]

## 2012-05-25 NOTE — Progress Notes (Signed)
Progress Note from the Palliative Medicine Team at [facility] [place]: - patient is alert and oriented,  Engaged in conversation regarding anticipated dc home today, continues to be comfortable with his decision for full comfort care path, he is hopeful for some "normal" time at home with family before transitioning to residential hospice  -recognizes that there is still some difficult time ahead of him   Objective: Allergies  Allergen Reactions  . Asacol (Mesalamine)   . Azathioprine   . Mycophenolate Mofetil   . Other     NUTS  . Protein     Agent: Additives and preservatives  . Wheat Bran    Scheduled Meds: . antiseptic oral rinse  15 mL Mouth Rinse q12n4p  . chlorhexidine  15 mL Mouth Rinse BID  . predniSONE  10 mg Oral Daily  . sodium chloride  3 mL Intravenous Q12H   Continuous Infusions:  PRN Meds:.acetaminophen, acetaminophen, clonazePAM, ondansetron (ZOFRAN) IV, ondansetron, senna-docusate, traMADol  BP 111/60  Pulse 99  Temp(Src) 99.5 F (37.5 C) (Oral)  Resp 18  Ht 6' 1"  (1.854 m)  Wt 59.5 kg (131 lb 2.8 oz)  BMI 17.31 kg/m2  SpO2 100%   PPS:40 %  Pain Score:denies   No intake or output data in the 24 hours ending 05/25/12 0828     Physical Exam: General: chronically ill appearing, NAD, cachectic  HEENT: + temporal muscle wasting, moist buccal membranes no exudate  Chest: CTA  CVS: RRR  Abdomen: flat NT +BS  Ext: + muscle atrophy, no edema  Neuro: weak, moves all 4 extremities Psych: patient continues with capacity for full medical decision making on evaluation  Labs: CBC    Component Value Date/Time   WBC 7.6 05/19/2012 0340   RBC 3.72* 05/19/2012 0340   HGB 12.1* 05/19/2012 0340   HCT 35.9* 05/19/2012 0340   PLT 177 05/19/2012 0340   MCV 96.5 05/19/2012 0340   MCH 32.5 05/19/2012 0340   MCHC 33.7 05/19/2012 0340   RDW 14.2 05/19/2012 0340   LYMPHSABS 1.2 05/15/2012 1817   MONOABS 1.3* 05/15/2012 1817   EOSABS 0.0 05/15/2012 1817   BASOSABS  0.0 05/15/2012 1817    BMET    Component Value Date/Time   NA 134* 05/19/2012 0340   K 4.0 05/19/2012 0340   CL 101 05/19/2012 0340   CO2 26 05/19/2012 0340   GLUCOSE 73 05/19/2012 0340   BUN 7 05/19/2012 0340   CREATININE 0.72 05/25/2012 0345   CALCIUM 8.7 05/19/2012 0340   GFRNONAA >90 05/25/2012 0345   GFRAA >90 05/25/2012 0345    CMP     Component Value Date/Time   NA 134* 05/19/2012 0340   K 4.0 05/19/2012 0340   CL 101 05/19/2012 0340   CO2 26 05/19/2012 0340   GLUCOSE 73 05/19/2012 0340   BUN 7 05/19/2012 0340   CREATININE 0.72 05/25/2012 0345   CALCIUM 8.7 05/19/2012 0340   PROT 6.5 05/18/2012 1205   ALBUMIN 2.5* 05/18/2012 1205   AST 35 05/18/2012 1205   ALT 43 05/18/2012 1205   ALKPHOS 164* 05/18/2012 1205   BILITOT 0.4 05/18/2012 1205   GFRNONAA >90 05/25/2012 0345   GFRAA >90 05/25/2012 0345    Assessment and Plan:   1. Code Status: DNR/DNI 2. Symptom Control: Pain: Ultram previously written ordered 3. Psycho/Social: Emotional support offered, continued conversation regarding his decision for a comfort path allowing nature to take its course, patient verbalizes a sense of peace in regards to his  decsion 4. Spiritual Chaplain services involved        5.    Disposition:  To dc home with hospice today     Time In Time Out Total Time Spent with Patient Total Overall Time  0800 0825 25 min 25 min    Greater than 50%  of this time was spent counseling and coordinating care related to the above assessment and plan.   Wadie Lessen NP  Palliative Medicine Team Team Phone # [phone] Pager [phone]  1

## 2012-05-25 NOTE — Progress Notes (Signed)
Reviewed prescriptions and AVS with patient he verbalized understanding.  BSC and wheelchair have been delivered to bedside.  Patient is to contact Hospice upon discharge.

## 2012-05-25 NOTE — Discharge Summary (Signed)
Physician Discharge Summary  [name] TKP:[id number] DOB: 05/04/1965 DOA: 05/18/2012  PCP: [name], MD  Admit date: 05/18/2012 Discharge date: 05/25/2012  Recommendations for Outpatient Follow-up:  1. Follow up with PCP in 1-2 weeks post discharge  Discharge Diagnoses:  Principal Problem:   Suicide and self-inflicted poisoning by other sedatives and hypnotics Active Problems:   DEPRESSION   CROHN'S DISEASE, LARGE AND SMALL INTESTINES   Overdose   Hypokalemia   Weakness generalized   Adult failure to thrive   Pain, generalized   Chronic mesenteric ischemia  Discharge Condition: medically stable for discharge home today  Diet recommendation: as tolerated  History of present illness:  47 y.o. male with past medical history of Crohn's disease, depression who presented to [facility] ED 05/18/2012 after he swallowed a 40-50 pills nortriptyline claiming he was under a lot of stress. He was brought by family due to to lethargy.  In ED, patient remained very lethargic, urine drug screen was negative. Patient was observed until he is mental status is back to baseline. Palliative care was consulted for goals of care. Patient reported he feels he is at the end of his life and does not want to continue with medical intervention including surgeries.   Assessment/Plan:   Principal problem:  Suicide attempt by overdose  Overdose with Nortriptyline.  Patient is at his baseline mental status, oriented to time, place and person Active problems:  Major depressive disorder  Per psychiatry patient has capacity to make decisions about his medical treatments, choices  Suicide sitter discontinued Crohn's disease  Stable  Hypokalemia  Repleted   Code Status: DNR/DNI  Family Communication: no family at bedside  Disposition Plan: home today   Leisa Lenz, MD  [facility]  Pager [phone]    Consultants:  GI  Palliative care  Psych Procedures:  None  Antibiotics:  None     Discharge Exam: Filed Vitals:   05/25/12 0450  BP: 111/60  Pulse: 99  Temp: 99.5 F (37.5 C)  Resp: 18   Filed Vitals:   05/22/12 0421 05/23/12 0612 05/24/12 0611 05/25/12 0450  BP: 144/94 117/80 104/68 111/60  Pulse: 106 91 93 99  Temp: 98 F (36.7 C) 97.8 F (36.6 C) 97.9 F (36.6 C) 99.5 F (37.5 C)  TempSrc: Oral Oral Oral Oral  Resp: 18 16 20 18   Height:      Weight:      SpO2: 100% 100% 100% 100%    General: Pt is alert, follows commands appropriately, not in acute distress Cardiovascular: Regular rate and rhythm, S1/S2 +, no murmurs, no rubs, no gallops Respiratory: Clear to auscultation bilaterally, no wheezing, no crackles, no rhonchi Abdominal: Soft, non tender, non distended, bowel sounds +, no guarding Extremities: no edema, no cyanosis, pulses palpable bilaterally DP and PT Neuro: Grossly nonfocal  Discharge Instructions  Discharge Orders   Future Appointments Provider Department Dept Phone   06/02/2012 11:30 AM [name], MD [facility] [phone]   Future Orders Complete By Expires     Call MD for:  difficulty breathing, headache or visual disturbances  As directed     Call MD for:  persistant dizziness or light-headedness  As directed     Call MD for:  persistant nausea and vomiting  As directed     Call MD for:  severe uncontrolled pain  As directed     Diet - low sodium heart healthy  As directed     Increase activity slowly  As directed  Medication List    TAKE these medications       adalimumab 40 MG/0.8ML injection  Commonly known as:  HUMIRA  Inject 0.8 mLs (40 mg total) into the skin every 14 (fourteen) days.     ALPRAZolam 1 MG tablet  Commonly known as:  XANAX  Take 0.5 tablets (0.5 mg total) by mouth 3 (three) times daily as needed (sleep/anxiety).     escitalopram 20 MG tablet  Commonly known as:  LEXAPRO  Take 1 tablet (20 mg total) by mouth daily.     folic acid 1 MG tablet  Commonly  known as:  FOLVITE  Take 1 mg by mouth daily.     mirtazapine 30 MG tablet  Commonly known as:  REMERON  Take 1 tablet (30 mg total) by mouth at bedtime.     potassium chloride SA 20 MEQ tablet  Commonly known as:  K-DUR,KLOR-CON  Take 20 mEq by mouth 2 (two) times daily.     predniSONE 10 MG tablet  Commonly known as:  DELTASONE  Take 15 mg by mouth daily.     senna-docusate 8.6-50 MG per tablet  Commonly known as:  Senokot-S  Take 1 tablet by mouth at bedtime as needed.     thiamine 100 MG tablet  Commonly known as:  VITAMIN B-1  Take 100 mg by mouth daily.     zolpidem 5 MG tablet  Commonly known as:  AMBIEN  Take 5 mg by mouth at bedtime as needed (sleep).           Follow-up Information   Follow up with [facility] ([facility] to follow aftr d/c pls notify whn pt ready to leave unit at d/c call [phone] (or [phone] aftr 5 pm))    Contact information:   [place], [place]       The results of significant diagnostics from this hospitalization (including imaging, microbiology, ancillary and laboratory) are listed below for reference.    Significant Diagnostic Studies: No results found.  Microbiology: Recent Results (from the past 240 hour(s))  MRSA PCR SCREENING     Status: None   Collection Time    05/18/12  5:27 PM      Result Value Range Status   MRSA by PCR NEGATIVE  NEGATIVE Final   Comment:            The GeneXpert MRSA Assay (FDA     approved for NASAL specimens     only), is one component of a     comprehensive MRSA colonization     surveillance program. It is not     intended to diagnose MRSA     infection nor to guide or     monitor treatment for     MRSA infections.     Labs: Basic Metabolic Panel:  Recent Labs Lab 05/18/12 1200 05/18/12 1205 05/19/12 0340 05/25/12 0345  NA  --  141 134*  --   K  --  3.1* 4.0  --   CL  --  101 101  --   CO2  --  33* 26  --    GLUCOSE  --  79 73  --   BUN  --  6 7  --   CREATININE  --  0.71 0.66 0.72  CALCIUM  --  8.5 8.7  --   MG 2.2  --   --   --    Liver Function Tests:  Recent Labs Lab 05/18/12 1205  AST 35  ALT 43  ALKPHOS 164*  BILITOT 0.4  PROT 6.5  ALBUMIN 2.5*   No results found for this basename: LIPASE, AMYLASE,  in the last 168 hours No results found for this basename: AMMONIA,  in the last 168 hours CBC:  Recent Labs Lab 05/18/12 1205 05/19/12 0340  WBC 8.3 7.6  HGB 13.5 12.1*  HCT 40.7 35.9*  MCV 96.7 96.5  PLT 190 177   Cardiac Enzymes: No results found for this basename: CKTOTAL, CKMB, CKMBINDEX, TROPONINI,  in the last 168 hours BNP: BNP (last 3 results) No results found for this basename: PROBNP,  in the last 8760 hours CBG:  Recent Labs Lab 05/18/12 1202  GLUCAP 78    Time coordinating discharge: Over 30 minutes  Signed:  Leisa Lenz, MD  [place]  05/25/2012, 9:03 AM  Pager #: [phone]

## 2012-05-26 DIAGNOSIS — K5090 Crohn's disease, unspecified, without complications: Secondary | ICD-10-CM | POA: Diagnosis not present

## 2012-05-29 ENCOUNTER — Telehealth: Payer: Self-pay | Admitting: Internal Medicine

## 2012-05-29 ENCOUNTER — Encounter: Payer: Self-pay | Admitting: Internal Medicine

## 2012-05-29 DIAGNOSIS — K5090 Crohn's disease, unspecified, without complications: Secondary | ICD-10-CM | POA: Diagnosis not present

## 2012-05-29 MED ORDER — MORPHINE SULFATE (CONCENTRATE) 20 MG/ML PO SOLN: ORAL | Status: DC

## 2012-05-29 MED ORDER — LORAZEPAM 1 MG PO TABS: 1.0000 mg | ORAL_TABLET | Freq: Three times a day (TID) | ORAL | Status: DC | PRN

## 2012-05-29 NOTE — Telephone Encounter (Signed)
Lois from [facility] called requesting alternate pain meds and ativan.  Rx sent to [facility] per [name].    Morphine 20 mg/ml and Ativan  Lucita Ferrara is aware

## 2012-05-29 NOTE — Progress Notes (Signed)
Patient ID: [name], male   DOB: 1965/09/17, 47 y.o.   MRN: [id number] Faxed Physician's Orders/Plan of Care to Hospice at fax# [phone] after signed by [name].  Sent down to be scanned in.

## 2012-05-30 DIAGNOSIS — K5090 Crohn's disease, unspecified, without complications: Secondary | ICD-10-CM | POA: Diagnosis not present

## 2012-05-31 ENCOUNTER — Telehealth: Payer: Self-pay | Admitting: Internal Medicine

## 2012-05-31 DIAGNOSIS — K5090 Crohn's disease, unspecified, without complications: Secondary | ICD-10-CM | POA: Diagnosis not present

## 2012-05-31 NOTE — Telephone Encounter (Signed)
Patient is requesting to go to [facility] Is this ok?

## 2012-05-31 NOTE — Telephone Encounter (Signed)
Yes - ok to move to [facility] Let me know if I need to sign anything

## 2012-05-31 NOTE — Telephone Encounter (Signed)
Left a message on [place], RN  Voice mail that it is ok for patient to move to [facility] and to let us know if we need to sign anything.

## 2012-06-01 ENCOUNTER — Encounter: Payer: Self-pay | Admitting: Internal Medicine

## 2012-06-01 DIAGNOSIS — K5090 Crohn's disease, unspecified, without complications: Secondary | ICD-10-CM | POA: Diagnosis not present

## 2012-06-01 NOTE — Progress Notes (Signed)
Patient ID: [name], male   DOB: 08-23-1965, 47 y.o.   MRN: [id number] Per [name] I contacted [place] at # [phone] to let them know that [name] no longer needs the consult with them. Currently he is with Hospice.

## 2012-06-02 ENCOUNTER — Ambulatory Visit: Payer: Medicare Other | Admitting: Internal Medicine

## 2012-06-02 DIAGNOSIS — K5090 Crohn's disease, unspecified, without complications: Secondary | ICD-10-CM | POA: Diagnosis not present

## 2012-06-03 DIAGNOSIS — K5090 Crohn's disease, unspecified, without complications: Secondary | ICD-10-CM | POA: Diagnosis not present

## 2012-06-04 DIAGNOSIS — K5090 Crohn's disease, unspecified, without complications: Secondary | ICD-10-CM | POA: Diagnosis not present

## 2012-06-05 DIAGNOSIS — K5090 Crohn's disease, unspecified, without complications: Secondary | ICD-10-CM | POA: Diagnosis not present

## 2012-06-06 DIAGNOSIS — K5090 Crohn's disease, unspecified, without complications: Secondary | ICD-10-CM | POA: Diagnosis not present

## 2012-06-07 ENCOUNTER — Encounter: Payer: Self-pay | Admitting: Vascular Surgery

## 2012-06-07 DIAGNOSIS — K5090 Crohn's disease, unspecified, without complications: Secondary | ICD-10-CM | POA: Diagnosis not present

## 2012-06-08 ENCOUNTER — Encounter: Payer: Self-pay | Admitting: Vascular Surgery

## 2012-06-08 ENCOUNTER — Encounter: Payer: Self-pay | Admitting: Internal Medicine

## 2012-06-08 DIAGNOSIS — K5090 Crohn's disease, unspecified, without complications: Secondary | ICD-10-CM | POA: Diagnosis not present

## 2012-06-08 NOTE — Telephone Encounter (Signed)
Discussed with Dr. Oneida Alar.  Recommended that pt's family check with his primary care physician or his GI specialist, [name], since both doctors are more directly involved in his care, and know his situation better.

## 2012-06-08 NOTE — Telephone Encounter (Signed)
[name] do you have any recommendations for a "skilled Nursing Facility" for [facility], he is leaving hospice

## 2012-06-09 ENCOUNTER — Encounter: Payer: Self-pay | Admitting: Internal Medicine

## 2012-06-09 DIAGNOSIS — K5090 Crohn's disease, unspecified, without complications: Secondary | ICD-10-CM | POA: Diagnosis not present

## 2012-06-09 NOTE — Progress Notes (Signed)
Hospice Orders that were faxed over to be signed by Dr Carlean Purl were signed 06/08/12 and have been faxed back to fax [phone]. Please see fax under "media."

## 2012-06-12 DIAGNOSIS — K5090 Crohn's disease, unspecified, without complications: Secondary | ICD-10-CM | POA: Diagnosis not present

## 2012-06-13 DIAGNOSIS — K5090 Crohn's disease, unspecified, without complications: Secondary | ICD-10-CM | POA: Diagnosis not present

## 2012-06-15 NOTE — Progress Notes (Signed)
Patient ID: [name], male   DOB: [birth year], 47 y.o.   MRN: [id number] Faxed signed orders to [phone], Hospice [facility], sent them down to be scanned in.

## 2012-06-19 ENCOUNTER — Telehealth: Payer: Self-pay | Admitting: Internal Medicine

## 2012-06-19 DIAGNOSIS — K5090 Crohn's disease, unspecified, without complications: Secondary | ICD-10-CM | POA: Diagnosis not present

## 2012-06-19 NOTE — Telephone Encounter (Signed)
Lois his hospice nurse called and was relaying requests from patient.  He has decided again that he wants to die and has not eaten since Thursday 06/15/12.  Patient is asking to change his Ativan to klonopin and asking for Ambien.  The nurse has suggested that he take his ativan for rest and does not necessarily need the Ambien for comfort.  The patient also has morphine at home for pain.  I have explained that [name] is out of town and that no med changes will be made until he returns.  They will call back next week if they still want to make med changes

## 2012-06-22 DIAGNOSIS — K5090 Crohn's disease, unspecified, without complications: Secondary | ICD-10-CM | POA: Diagnosis not present

## 2012-06-26 ENCOUNTER — Telehealth: Payer: Self-pay | Admitting: Internal Medicine

## 2012-06-26 NOTE — Telephone Encounter (Signed)
I called and left message on [name]'s voice mail at Hospice that per [name] we may not refill his Ambien at this time.

## 2012-06-27 DIAGNOSIS — K5090 Crohn's disease, unspecified, without complications: Secondary | ICD-10-CM | POA: Diagnosis not present

## 2012-06-28 ENCOUNTER — Encounter: Payer: Self-pay | Admitting: Internal Medicine

## 2012-07-03 ENCOUNTER — Encounter: Payer: Self-pay | Admitting: Internal Medicine

## 2012-07-05 ENCOUNTER — Encounter: Payer: Self-pay | Admitting: Internal Medicine

## 2012-07-06 ENCOUNTER — Ambulatory Visit: Payer: Self-pay | Admitting: Internal Medicine

## 2012-07-06 ENCOUNTER — Ambulatory Visit (INDEPENDENT_AMBULATORY_CARE_PROVIDER_SITE_OTHER): Payer: Medicare Other | Admitting: Internal Medicine

## 2012-07-06 ENCOUNTER — Encounter: Payer: Self-pay | Admitting: Internal Medicine

## 2012-07-06 ENCOUNTER — Other Ambulatory Visit (INDEPENDENT_AMBULATORY_CARE_PROVIDER_SITE_OTHER): Payer: Medicare Other

## 2012-07-06 VITALS — BP 90/54 | HR 88 | Ht 73.0 in | Wt 133.4 lb

## 2012-07-06 DIAGNOSIS — F329 Major depressive disorder, single episode, unspecified: Secondary | ICD-10-CM

## 2012-07-06 DIAGNOSIS — K5080 Crohn's disease of both small and large intestine without complications: Secondary | ICD-10-CM

## 2012-07-06 DIAGNOSIS — F3289 Other specified depressive episodes: Secondary | ICD-10-CM

## 2012-07-06 DIAGNOSIS — F411 Generalized anxiety disorder: Secondary | ICD-10-CM

## 2012-07-06 DIAGNOSIS — K551 Chronic vascular disorders of intestine: Secondary | ICD-10-CM

## 2012-07-06 DIAGNOSIS — E43 Unspecified severe protein-calorie malnutrition: Secondary | ICD-10-CM | POA: Diagnosis not present

## 2012-07-06 DIAGNOSIS — F32A Depression, unspecified: Secondary | ICD-10-CM

## 2012-07-06 LAB — COMPREHENSIVE METABOLIC PANEL
ALT: 47 U/L (ref 0–53)
AST: 50 U/L — ABNORMAL HIGH (ref 0–37)
Albumin: 2.5 g/dL — ABNORMAL LOW (ref 3.5–5.2)
Calcium: 8.6 mg/dL (ref 8.4–10.5)
Chloride: 101 mEq/L (ref 96–112)
Creatinine, Ser: 0.7 mg/dL (ref 0.4–1.5)
Potassium: 4.1 mEq/L (ref 3.5–5.1)
Sodium: 135 mEq/L (ref 135–145)
Total Protein: 6.9 g/dL (ref 6.0–8.3)

## 2012-07-06 LAB — CBC WITH DIFFERENTIAL/PLATELET
Basophils Absolute: 0.1 10*3/uL (ref 0.0–0.1)
Eosinophils Absolute: 0.5 10*3/uL (ref 0.0–0.7)
Lymphocytes Relative: 18.5 % (ref 12.0–46.0)
MCHC: 33.5 g/dL (ref 30.0–36.0)
Neutrophils Relative %: 65.2 % (ref 43.0–77.0)
Platelets: 294 10*3/uL (ref 150.0–400.0)
RDW: 14.5 % (ref 11.5–14.6)

## 2012-07-06 MED ORDER — LORAZEPAM 1 MG PO TABS: 1.0000 mg | ORAL_TABLET | Freq: Three times a day (TID) | ORAL | Status: DC | PRN

## 2012-07-06 MED ORDER — DILTIAZEM GEL 2 %: CUTANEOUS | Status: DC

## 2012-07-06 MED ORDER — METRONIDAZOLE 250 MG PO TABS: 250.0000 mg | ORAL_TABLET | Freq: Three times a day (TID) | ORAL | Status: DC

## 2012-07-06 NOTE — Progress Notes (Signed)
Subjective:    Patient ID: [name], male    DOB: 1965/12/21, 48 y.o.   MRN: [id number]  HPI The patient returns after a course in which he went into hospice to die after deciding his Crohn's and mesenteric ischemia were too difficult to overcome and that he did not want to pursue treatment. He tried to overdose on oills (lorazepam and then a TCA) twice. He was admitted and went to home hospice and then [facility] but tells me "I could not stop eating". He essentially began to eat again, abdominal pain lessened and he has decided to pursue life again. He is eating better - weight is same as last hospital weight - stable.  Problems now are watery diarrhea for past several days, and rectal pain with defecation. No bleeding, fever or purulent discharge. Off prednisone at this time. Not sleeping well. Denies suicidal ideation.  Medications, allergies, past medical history, past surgical history, family history and social history are reviewed and updated in the EMR.   Review of Systems As above    Objective:   Physical Exam General:  NAD but thin, mildly chronically ill Eyes:   anicteric Lungs:  clear Heart:  S1S2 no rubs, murmurs or gallops Abdomen:  soft and nontender, BS+, no bruiots Ext:   no edema but violaceous and slightly cool feet with good pulses (DP and PT) Psych:  Brighter affect than before, appropriate, lacks insight into some/most of mental illness issues - attributes all to reactive changes, not suicidal    Data Reviewed:   Hospice notes Hospitalization records       Assessment & Plan:  Crohn's disease of small and large intestines - Plan: CBC with Differential, Comprehensive metabolic panel, Prealbumin  Chronic mesenteric ischemia - Plan: CBC with Differential, Comprehensive metabolic panel, Prealbumin, Ambulatory referral to Vascular Surgery  Protein-calorie malnutrition, severe  Generalized anxiety disorder  Depression  1. He should return to  Dr. Oneida Alar re: mesenteric ischemia but will need to gain weight and improve nutritional status I suspect, before any surgery 2. Metronidazole 250 mg tid for anal/rectal fissure (doubt abscess but will cover) 3. Diltiazem gel for fissure 4. Restart Humira for Crohn's 5. Increase food intake - ? nutrition evaluation - I suspect he eats too much for TPN or enteral nutrition - will investigate 6. Refill lorazepam x 1 month but I do not anticipate refilling or Rxing psychiatric medicines without psychiatric follow-up and guidance. 7. Return to psychiatry for f/u mental illness issues 8. REV me 1 month    Medication List       These changes are accurate as of: 07/06/2012 10:25 PM. If you have any questions, ask your nurse or doctor.                                                   TAKE these medications       diltiazem 2 % Gel  Apply to rectum 2 - 3 times a day  Started by:  [name], MD     LORazepam 1 MG tablet  Commonly known as:  ATIVAN  Take 1 tablet (1 mg total) by mouth 3 (three) times daily as needed for anxiety.     metroNIDAZOLE 250 MG tablet  Commonly known as:  FLAGYL  Take 1 tablet (250 mg total) by mouth 3 (three)  times daily.  Started by:  [name], MD     morphine 20 MG/ML concentrated solution  Commonly known as:  ROXANOL  0.25- 0.5 ml q 4 hours prn pain or SOB       EX:NTZG,YFVCBSW ALAN, MDCharles Eden Lathe, MD, Letta Moynahan, MD

## 2012-07-06 NOTE — Patient Instructions (Addendum)
Your physician has requested that you go to the basement for lab work before leaving today.  We have sent the following medications to your pharmacy for you to pick up at your convenience: Generic Flagyl, Ativan (Faxed in), Diltiazem gel  Restart your Humira.  Please make an appointment to see your psychiatry Dr.   Harley Alto will contact you with an appointment to see Dr. Ruta Hinds. (  Per [facility] appointment made for May 29th (CT and office visit).  They will mail information to patient.  Per [facility] patient had canceled his March CT and office visit appointment with them.  Follow up with [place] in a month .  We will call you with your lab results and plans.  Barbera Setters is going to work on getting a nutrition consult and will be in touch with you.  Thank you for choosing me and [facility].  [name], M.D., [facility]

## 2012-07-07 ENCOUNTER — Encounter: Payer: Self-pay | Admitting: Internal Medicine

## 2012-07-07 NOTE — Progress Notes (Signed)
Patient ID: [name], male   DOB: 05-26-1965, 47 y.o.   MRN: [id number] Faxed signed Hospice orders to [phone], sent to be scanned.

## 2012-07-09 ENCOUNTER — Encounter: Payer: Self-pay | Admitting: Internal Medicine

## 2012-07-15 DIAGNOSIS — M7660 Achilles tendinitis, unspecified leg: Secondary | ICD-10-CM | POA: Diagnosis not present

## 2012-07-15 DIAGNOSIS — J029 Acute pharyngitis, unspecified: Secondary | ICD-10-CM | POA: Diagnosis not present

## 2012-07-17 ENCOUNTER — Encounter: Payer: Self-pay | Admitting: Vascular Surgery

## 2012-07-17 NOTE — Telephone Encounter (Signed)
Mr. Kosh, I will ask Dr. Oneida Alar about the need for the CT scan and will get back to you asap. Dr. Eden Lathe will be in the office on Thursday 07-20-12.

## 2012-07-19 DIAGNOSIS — G479 Sleep disorder, unspecified: Secondary | ICD-10-CM | POA: Diagnosis not present

## 2012-07-19 DIAGNOSIS — F3289 Other specified depressive episodes: Secondary | ICD-10-CM | POA: Diagnosis not present

## 2012-07-19 DIAGNOSIS — R209 Unspecified disturbances of skin sensation: Secondary | ICD-10-CM | POA: Diagnosis not present

## 2012-07-19 DIAGNOSIS — K5090 Crohn's disease, unspecified, without complications: Secondary | ICD-10-CM | POA: Diagnosis not present

## 2012-07-19 DIAGNOSIS — E559 Vitamin D deficiency, unspecified: Secondary | ICD-10-CM | POA: Diagnosis not present

## 2012-07-19 DIAGNOSIS — F329 Major depressive disorder, single episode, unspecified: Secondary | ICD-10-CM | POA: Diagnosis not present

## 2012-07-19 DIAGNOSIS — R634 Abnormal weight loss: Secondary | ICD-10-CM | POA: Diagnosis not present

## 2012-07-20 ENCOUNTER — Telehealth: Payer: Self-pay | Admitting: Vascular Surgery

## 2012-07-20 NOTE — Telephone Encounter (Signed)
Spoke with patient about details of mesenteric bypass.  Procedure risk and benefit.  He wishes to defer his appt for now until he thinks his overall nutrition is better.  He will call to schedule appt whenever he thinks he is ready to consider bypass.  Ruta Hinds, MD [facility] Office: [phone] Pager: [phone]

## 2012-07-27 ENCOUNTER — Ambulatory Visit: Payer: Self-pay | Admitting: Vascular Surgery

## 2012-07-27 ENCOUNTER — Other Ambulatory Visit: Payer: Self-pay

## 2012-07-28 ENCOUNTER — Encounter: Payer: Self-pay | Admitting: Internal Medicine

## 2012-08-11 ENCOUNTER — Telehealth: Payer: Self-pay

## 2012-08-11 ENCOUNTER — Encounter: Payer: Self-pay | Admitting: Internal Medicine

## 2012-08-11 NOTE — Telephone Encounter (Signed)
From [place]   To [name], MD [[id number]]   Composed 08/11/2012 3:40 PM   For Delivery On 08/11/2012 3:40 PM   Subject Non-Urgent Medical Question   Message Type Patient Medical Advice Request   Read Status Y   Message Body I have got the [place] coming in on Monday, I am having a lot of difficulty with diarrhea at least five times a day for over a month. I think I need prednisone again, I have an appt. on the 30th but I am too much trouble to wait till then. Please let me know. [name]

## 2012-08-14 ENCOUNTER — Telehealth: Payer: Self-pay

## 2012-08-14 NOTE — Telephone Encounter (Signed)
I have left a message for the patient to call back in the am with a symptom update

## 2012-08-14 NOTE — Telephone Encounter (Signed)
Message copied by Marlon Pel on Mon Aug 14, 2012  5:07 PM ------      Message from: Silvano Rusk E      Created: Mon Aug 14, 2012  8:28 AM      Regarding: restart prednisone       There was a My Chart message that was closed by mistake            He is just now restarting his Humira, apparently coming today            Please call him and clarify sxs            I may need to see him sooner vs. Restarting prednisone              ------

## 2012-08-15 ENCOUNTER — Encounter: Payer: Self-pay | Admitting: Internal Medicine

## 2012-08-15 MED ORDER — DIPHENOXYLATE-ATROPINE 2.5-0.025 MG PO TABS: ORAL_TABLET | ORAL | Status: DC

## 2012-08-15 NOTE — Telephone Encounter (Signed)
Patient notified

## 2012-08-15 NOTE — Telephone Encounter (Signed)
I would like to avoid prednisone He can have a Lomotil Rx 1-2 qid #120 no refills

## 2012-08-15 NOTE — Telephone Encounter (Signed)
Patient reports 5-6 episodes of diarrhea, minimal rectal bleeding (he believes is from the fissure).  Has gained to 150 lbs.  He will take the Humira this afternoon

## 2012-08-16 ENCOUNTER — Telehealth: Payer: Self-pay

## 2012-08-16 DIAGNOSIS — K50918 Crohn's disease, unspecified, with other complication: Secondary | ICD-10-CM

## 2012-08-16 NOTE — Telephone Encounter (Signed)
Patient has conflicting appts with next weeks availability.  He asked to just keep the appt for 08/28/12.  He will come in next week and get the labs drawn

## 2012-08-16 NOTE — Telephone Encounter (Signed)
Message copied by Marlon Pel on Wed Aug 16, 2012  2:55 PM ------      Message from: [name]      Created: Tue Aug 15, 2012  9:28 PM      Regarding: RE: labs and move up visit       Yes please      ----- Message -----         From: Kellie Moor, RN         Sent: 08/15/2012   4:57 PM           To: [name], MD      Subject: RE: labs and move up visit                               I think that was a message he had sent prior to your response about lomotil. I sent that in and spoke to him this afternoon.  Do you still want to do the orders below?      ----- Message -----         From: [name], MD         Sent: 08/15/2012   4:36 PM           To: Kellie Moor, RN      Subject: labs and move up visit                                   Please contact him to have the following labs drawn this week.            1) CBC      2) CMET      3) prealbumin      4) CRP            Use Crohn's disease and malnutrition as dx            Also can we get him in to see me next Tues, ? In AM            He is asking for prednisone through My Chart and I have asked him to try the Lomotil              ------

## 2012-08-17 ENCOUNTER — Other Ambulatory Visit: Payer: Self-pay | Admitting: Internal Medicine

## 2012-08-17 DIAGNOSIS — R197 Diarrhea, unspecified: Secondary | ICD-10-CM

## 2012-08-24 ENCOUNTER — Other Ambulatory Visit: Payer: Self-pay | Admitting: Internal Medicine

## 2012-08-24 ENCOUNTER — Ambulatory Visit (INDEPENDENT_AMBULATORY_CARE_PROVIDER_SITE_OTHER): Payer: Medicare Other | Admitting: Psychology

## 2012-08-24 ENCOUNTER — Other Ambulatory Visit (INDEPENDENT_AMBULATORY_CARE_PROVIDER_SITE_OTHER): Payer: Medicare Other

## 2012-08-24 DIAGNOSIS — K5090 Crohn's disease, unspecified, without complications: Secondary | ICD-10-CM

## 2012-08-24 DIAGNOSIS — K50918 Crohn's disease, unspecified, with other complication: Secondary | ICD-10-CM

## 2012-08-24 DIAGNOSIS — F331 Major depressive disorder, recurrent, moderate: Secondary | ICD-10-CM

## 2012-08-24 DIAGNOSIS — I714 Abdominal aortic aneurysm, without rupture, unspecified: Secondary | ICD-10-CM

## 2012-08-24 LAB — CBC WITH DIFFERENTIAL/PLATELET
Basophils Relative: 0.7 % (ref 0.0–3.0)
Eosinophils Absolute: 0.6 10*3/uL (ref 0.0–0.7)
MCHC: 33.5 g/dL (ref 30.0–36.0)
MCV: 90.1 fl (ref 78.0–100.0)
Monocytes Absolute: 1.2 10*3/uL — ABNORMAL HIGH (ref 0.1–1.0)
Neutrophils Relative %: 52.8 % (ref 43.0–77.0)
RBC: 3.88 Mil/uL — ABNORMAL LOW (ref 4.22–5.81)

## 2012-08-24 LAB — COMPREHENSIVE METABOLIC PANEL
ALT: 164 U/L — ABNORMAL HIGH (ref 0–53)
CO2: 26 mEq/L (ref 19–32)
Creatinine, Ser: 0.9 mg/dL (ref 0.4–1.5)
GFR: 90.55 mL/min (ref >60.00)
Glucose, Bld: 88 mg/dL (ref 70–99)
Total Bilirubin: 0.4 mg/dL (ref 0.3–1.2)

## 2012-08-24 LAB — C-REACTIVE PROTEIN: CRP: 1.7 mg/dL (ref 0.5–20.0)

## 2012-08-28 ENCOUNTER — Other Ambulatory Visit: Payer: Medicare Other

## 2012-08-28 ENCOUNTER — Encounter: Payer: Self-pay | Admitting: Internal Medicine

## 2012-08-28 ENCOUNTER — Ambulatory Visit (INDEPENDENT_AMBULATORY_CARE_PROVIDER_SITE_OTHER): Payer: Medicare Other | Admitting: Internal Medicine

## 2012-08-28 VITALS — BP 96/70 | HR 72 | Ht 73.0 in | Wt 154.2 lb

## 2012-08-28 DIAGNOSIS — K5080 Crohn's disease of both small and large intestine without complications: Secondary | ICD-10-CM

## 2012-08-28 DIAGNOSIS — E43 Unspecified severe protein-calorie malnutrition: Secondary | ICD-10-CM

## 2012-08-28 DIAGNOSIS — R197 Diarrhea, unspecified: Secondary | ICD-10-CM

## 2012-08-28 DIAGNOSIS — K8309 Other cholangitis: Secondary | ICD-10-CM

## 2012-08-28 DIAGNOSIS — K50818 Crohn's disease of both small and large intestine with other complication: Secondary | ICD-10-CM

## 2012-08-28 DIAGNOSIS — K551 Chronic vascular disorders of intestine: Secondary | ICD-10-CM

## 2012-08-28 DIAGNOSIS — M2550 Pain in unspecified joint: Secondary | ICD-10-CM

## 2012-08-28 DIAGNOSIS — K8301 Primary sclerosing cholangitis: Secondary | ICD-10-CM

## 2012-08-28 NOTE — Patient Instructions (Addendum)
Your physician has requested that you go to the basement for the following lab work in a month, no appointment needed, open 8-5pm. LFT's  Follow up with [place] in 3 months.   I appreciate the opportunity to care for you.

## 2012-08-28 NOTE — Assessment & Plan Note (Signed)
He has known significant mesenteric ischemia from CT angiography. Prior to this time he was on prednisone that and his nutritional status precluded surgery plus he would not do that, and he was in the midst of all the depression and desire to his life naturally. At this point where the holding pattern, does not make sense for him to go to the surgeon needs to continue boost nutritionally, and I would like to see how much treatment with you marrow would do for his diarrhea and his disease. There is no easy solution here, obviously. He seems to be improved over also there is no role for urgent surgical intervention clearly.

## 2012-08-28 NOTE — Progress Notes (Signed)
Subjective:    Patient ID: [name], male    DOB: 1966-01-19, 47 y.o.   MRN: [id number]  HPI The patient returns for followup. He has multiple problems, the 2 most significant of late has been his Crohn's disease of the ileum and colon as well as mesenteric ischemia and suspected contribution to colitis. He was also on hospice for a period of time as he did not wish to pursue aggressive therapy and wanted to have comfort care. Since that time he has been eating, he says he cannot tolerate boost or Ensure due to diarrhea. He recently restarted Humira and has taken one injection. Lomotil is helping symptoms of diarrhea. His weight is increasing. He is in the better. He still suffers with insomnia. Medications, allergies, past medical history, past surgical history, family history and social history are reviewed and updated in the EMR.  Review of Systems As above he is also complaining of diffuse arthralgias wondering if he does not have rheumatoid arthritis    Objective:   Physical Exam General:  NAD Eyes:   anicteric Lungs:  clear Heart:  S1S2 no rubs, murmurs or gallops Abdomen:  soft and nontender, BS+, no bruits Ext:   no edema, no arthritis - no knee effusions    Data Reviewed:  Lab Results  Component Value Date   WBC 8.0 08/24/2012   HGB 11.7* 08/24/2012   HCT 34.9* 08/24/2012   MCV 90.1 08/24/2012   PLT 270.0 08/24/2012     Chemistry      Component Value Date/Time   NA 137 08/24/2012 1519   K 4.2 08/24/2012 1519   CL 100 08/24/2012 1519   CO2 26 08/24/2012 1519   BUN 14 08/24/2012 1519   CREATININE 0.9 08/24/2012 1519      Component Value Date/Time   CALCIUM 8.6 08/24/2012 1519   ALKPHOS 236* 08/24/2012 1519   AST 143* 08/24/2012 1519   ALT 164* 08/24/2012 1519   BILITOT 0.4 08/24/2012 1519    Prealbumin 17.0 - 34.0 mg/dL  11.6 (L)    16.4 (L)    8.3 (L)     .   Assessment & Plan:   1. Crohn's ileocolitis, other complication   2. Chronic mesenteric ischemia    3. Primary sclerosing cholangitis ? hepatitis overlap   4. Severe protein-calorie malnutrition   5. Arthralgia      Medication List       This list is accurate as of: 08/28/12  5:49 PM.  Always use your most recent med list.               diltiazem 2 % Gel  Apply to rectum 2 - 3 times a day     diphenoxylate-atropine 2.5-0.025 MG per tablet  Commonly known as:  LOMOTIL  1-2 tablets po qid prn diarrhea     fluticasone 50 MCG/ACT nasal spray  Commonly known as:  FLONASE  Place 2 sprays into the nose daily. Each nostril     HUMIRA PEN 40 MG/0.8ML injection  Generic drug:  adalimumab  Inject 40 mg into the skin every 14 (fourteen) days.     hydrOXYzine 25 MG capsule  Commonly known as:  VISTARIL  Take 25 mg by mouth 3 (three) times daily as needed for itching. Patient unsure of dosage     REMERON 30 MG tablet  Generic drug:  mirtazapine  Take 30 mg by mouth at bedtime.     traMADol 50 MG tablet  Commonly known as:  ULTRAM  Take 50 mg by mouth every 6 (six) hours as needed for pain.

## 2012-08-28 NOTE — Assessment & Plan Note (Signed)
Weight up, prealbumin down He had too much diarrhea with ensure/Boost - stopped

## 2012-08-28 NOTE — Assessment & Plan Note (Signed)
He still has symptoms mostly with diarrhea. He has just restarted he marijuana and he has been able to stay off of prednisone which would like to try to avoid using again and he is in agreement. We'll continue the Lomotil only Humira and plan followup in 3 months. Sooner if needed.

## 2012-08-28 NOTE — Assessment & Plan Note (Addendum)
LFT's rising again, will repeat these in a month.

## 2012-08-28 NOTE — Assessment & Plan Note (Signed)
He is convinced he has arthritis. Released he was. I don't see any significant joint deformities to suggest an inflammatory arthritis. I pointed this out to him. He will followup with his primary care physician regarding this. He should avoid traditional nonsteroidals. Have been trying to avoid prednisone but sometimes a low dose that could be necessary depending upon how severe his problems are. Will defer to primary care at this point.

## 2012-08-30 ENCOUNTER — Other Ambulatory Visit: Payer: Self-pay

## 2012-08-30 LAB — CLOSTRIDIUM DIFFICILE BY PCR: Toxigenic C. Difficile by PCR: DETECTED — CR

## 2012-08-30 MED ORDER — VANCOMYCIN 50 MG/ML ORAL SOLUTION: 125.0000 mg | Freq: Four times a day (QID) | ORAL | Status: DC

## 2012-08-30 NOTE — Progress Notes (Signed)
Quick Note:  Please let him know he has + C diff test  I want him to take vancomycin 125 mg 4 times a day x 2 weeks and then we need to know how he is   ______

## 2012-09-05 ENCOUNTER — Ambulatory Visit (INDEPENDENT_AMBULATORY_CARE_PROVIDER_SITE_OTHER): Payer: Medicare Other | Admitting: Psychology

## 2012-09-05 DIAGNOSIS — F331 Major depressive disorder, recurrent, moderate: Secondary | ICD-10-CM

## 2012-09-07 ENCOUNTER — Ambulatory Visit: Payer: Medicare Other | Admitting: Psychology

## 2012-09-13 DIAGNOSIS — M2550 Pain in unspecified joint: Secondary | ICD-10-CM | POA: Diagnosis not present

## 2012-09-19 ENCOUNTER — Ambulatory Visit (INDEPENDENT_AMBULATORY_CARE_PROVIDER_SITE_OTHER): Payer: Medicare Other | Admitting: Psychology

## 2012-09-19 ENCOUNTER — Other Ambulatory Visit (INDEPENDENT_AMBULATORY_CARE_PROVIDER_SITE_OTHER): Payer: Medicare Other

## 2012-09-19 ENCOUNTER — Encounter: Payer: Self-pay | Admitting: Internal Medicine

## 2012-09-19 DIAGNOSIS — F331 Major depressive disorder, recurrent, moderate: Secondary | ICD-10-CM | POA: Diagnosis not present

## 2012-09-19 DIAGNOSIS — H18519 Endothelial corneal dystrophy, unspecified eye: Secondary | ICD-10-CM | POA: Diagnosis not present

## 2012-09-19 DIAGNOSIS — K50818 Crohn's disease of both small and large intestine with other complication: Secondary | ICD-10-CM

## 2012-09-19 DIAGNOSIS — H524 Presbyopia: Secondary | ICD-10-CM | POA: Diagnosis not present

## 2012-09-19 DIAGNOSIS — H04129 Dry eye syndrome of unspecified lacrimal gland: Secondary | ICD-10-CM | POA: Diagnosis not present

## 2012-09-19 DIAGNOSIS — K5080 Crohn's disease of both small and large intestine without complications: Secondary | ICD-10-CM | POA: Diagnosis not present

## 2012-09-19 DIAGNOSIS — H43819 Vitreous degeneration, unspecified eye: Secondary | ICD-10-CM | POA: Diagnosis not present

## 2012-09-19 LAB — HEPATIC FUNCTION PANEL
Alkaline Phosphatase: 265 U/L — ABNORMAL HIGH (ref 39–117)
Bilirubin, Direct: 0.2 mg/dL (ref 0.0–0.3)
Total Bilirubin: 0.8 mg/dL (ref 0.3–1.2)

## 2012-09-20 ENCOUNTER — Telehealth: Payer: Self-pay

## 2012-09-20 ENCOUNTER — Other Ambulatory Visit: Payer: Self-pay | Admitting: Internal Medicine

## 2012-09-20 MED ORDER — VANCOMYCIN 50 MG/ML ORAL SOLUTION: 125.0000 mg | Freq: Four times a day (QID) | ORAL | Status: DC

## 2012-09-20 NOTE — Telephone Encounter (Signed)
Message copied by [name] on Wed Sep 20, 2012  3:54 PM ------      Message from: Silvano Rusk E      Created: Wed Sep 20, 2012 12:46 PM      Regarding: appointmnent       I refilled his vancomycin for a second course of Tx            Please get him a follow-up for later in August ------

## 2012-09-20 NOTE — Telephone Encounter (Signed)
Patient

## 2012-09-21 ENCOUNTER — Encounter: Payer: Self-pay | Admitting: Internal Medicine

## 2012-09-21 ENCOUNTER — Telehealth: Payer: Self-pay

## 2012-09-21 MED ORDER — VANCOMYCIN HCL 125 MG PO CAPS: 125.0000 mg | ORAL_CAPSULE | Freq: Four times a day (QID) | ORAL | Status: DC

## 2012-09-21 NOTE — Telephone Encounter (Signed)
Spoke to [employer]) to see if patient's medicine was ready for pick up.  She stated they didn't get the called in rx yesterday.  Verbally gave them the Vancomycin order.  She said he has met his deductible and that it would be no co-pay switching him to the pill form instead of the liquid Vancomycin.  Verbal ok given.

## 2012-09-24 NOTE — Progress Notes (Signed)
Quick Note:  Ask [name] to repeat LFT's this week - they are up again ______

## 2012-09-25 ENCOUNTER — Other Ambulatory Visit: Payer: Self-pay

## 2012-09-25 DIAGNOSIS — K8301 Primary sclerosing cholangitis: Secondary | ICD-10-CM

## 2012-09-27 ENCOUNTER — Encounter: Payer: Self-pay | Admitting: Internal Medicine

## 2012-09-27 ENCOUNTER — Other Ambulatory Visit (INDEPENDENT_AMBULATORY_CARE_PROVIDER_SITE_OTHER): Payer: Medicare Other

## 2012-09-27 DIAGNOSIS — K8309 Other cholangitis: Secondary | ICD-10-CM

## 2012-09-27 DIAGNOSIS — K8301 Primary sclerosing cholangitis: Secondary | ICD-10-CM

## 2012-09-27 LAB — HEPATIC FUNCTION PANEL
ALT: 1116 U/L — ABNORMAL HIGH (ref 0–53)
AST: 918 U/L — ABNORMAL HIGH (ref 0–37)
Albumin: 2.6 g/dL — ABNORMAL LOW (ref 3.5–5.2)
Alkaline Phosphatase: 228 U/L — ABNORMAL HIGH (ref 39–117)
Total Protein: 7.2 g/dL (ref 6.0–8.3)

## 2012-09-27 NOTE — Progress Notes (Signed)
Quick Note:  I called results to him He feels good - no pruritis or fever or pain Vancomycin stopped diarrhea  He does not need any other Tx at this time  i suspect his PSC is flaring  Please have him repeat LFT's in about 2 weeks and do a prealbumin then also   ______

## 2012-09-28 ENCOUNTER — Other Ambulatory Visit: Payer: Self-pay

## 2012-09-28 DIAGNOSIS — K8301 Primary sclerosing cholangitis: Secondary | ICD-10-CM

## 2012-10-03 ENCOUNTER — Ambulatory Visit (INDEPENDENT_AMBULATORY_CARE_PROVIDER_SITE_OTHER): Payer: Medicare Other | Admitting: Psychology

## 2012-10-03 DIAGNOSIS — F331 Major depressive disorder, recurrent, moderate: Secondary | ICD-10-CM

## 2012-10-12 ENCOUNTER — Encounter: Payer: Self-pay | Admitting: Internal Medicine

## 2012-10-12 DIAGNOSIS — R197 Diarrhea, unspecified: Secondary | ICD-10-CM

## 2012-10-13 ENCOUNTER — Other Ambulatory Visit: Payer: Medicare Other

## 2012-10-13 DIAGNOSIS — R197 Diarrhea, unspecified: Secondary | ICD-10-CM

## 2012-10-13 MED ORDER — VANCOMYCIN HCL 125 MG PO CAPS: 125.0000 mg | ORAL_CAPSULE | Freq: Four times a day (QID) | ORAL | Status: DC

## 2012-10-13 NOTE — Telephone Encounter (Signed)
Patient advised that a probiotic is good to take with vanc.  He will drop off his sample and get his LFTs checked when he comes.

## 2012-10-16 ENCOUNTER — Encounter: Payer: Self-pay | Admitting: Internal Medicine

## 2012-10-16 ENCOUNTER — Other Ambulatory Visit (INDEPENDENT_AMBULATORY_CARE_PROVIDER_SITE_OTHER): Payer: Medicare Other

## 2012-10-16 DIAGNOSIS — K5090 Crohn's disease, unspecified, without complications: Secondary | ICD-10-CM

## 2012-10-16 DIAGNOSIS — K8309 Other cholangitis: Secondary | ICD-10-CM

## 2012-10-16 DIAGNOSIS — K8301 Primary sclerosing cholangitis: Secondary | ICD-10-CM

## 2012-10-16 LAB — BASIC METABOLIC PANEL WITH GFR
BUN: 11 mg/dL (ref 6–23)
CO2: 29 meq/L (ref 19–32)
Calcium: 8.4 mg/dL (ref 8.4–10.5)
Chloride: 104 meq/L (ref 96–112)
Creatinine, Ser: 0.9 mg/dL (ref 0.4–1.5)
GFR: 97.65 mL/min
Glucose, Bld: 70 mg/dL (ref 70–99)
Potassium: 3.9 meq/L (ref 3.5–5.1)
Sodium: 138 meq/L (ref 135–145)

## 2012-10-16 LAB — HEPATIC FUNCTION PANEL
ALT: 562 U/L — ABNORMAL HIGH (ref 0–53)
AST: 356 U/L — ABNORMAL HIGH (ref 0–37)
Albumin: 2.6 g/dL — ABNORMAL LOW (ref 3.5–5.2)
Alkaline Phosphatase: 141 U/L — ABNORMAL HIGH (ref 39–117)
Bilirubin, Direct: 0.2 mg/dL (ref 0.0–0.3)
Total Bilirubin: 0.6 mg/dL (ref 0.3–1.2)
Total Protein: 7.4 g/dL (ref 6.0–8.3)

## 2012-10-16 LAB — PHOSPHORUS: Phosphorus: 4.2 mg/dL (ref 2.3–4.6)

## 2012-10-16 NOTE — Progress Notes (Signed)
Quick Note:  Liver tests much better ______

## 2012-10-17 ENCOUNTER — Encounter: Payer: Self-pay | Admitting: Internal Medicine

## 2012-10-17 LAB — PREALBUMIN: Prealbumin: 6.1 mg/dL — ABNORMAL LOW (ref 17.0–34.0)

## 2012-10-18 ENCOUNTER — Ambulatory Visit: Payer: Medicare Other | Admitting: Psychology

## 2012-10-19 ENCOUNTER — Ambulatory Visit (INDEPENDENT_AMBULATORY_CARE_PROVIDER_SITE_OTHER): Payer: Medicare Other | Admitting: Psychology

## 2012-10-19 ENCOUNTER — Encounter: Payer: Self-pay | Admitting: Internal Medicine

## 2012-10-19 DIAGNOSIS — F331 Major depressive disorder, recurrent, moderate: Secondary | ICD-10-CM | POA: Diagnosis not present

## 2012-10-23 ENCOUNTER — Encounter: Payer: Self-pay | Admitting: Internal Medicine

## 2012-10-23 ENCOUNTER — Ambulatory Visit (INDEPENDENT_AMBULATORY_CARE_PROVIDER_SITE_OTHER): Payer: Medicare Other | Admitting: Internal Medicine

## 2012-10-23 ENCOUNTER — Other Ambulatory Visit (INDEPENDENT_AMBULATORY_CARE_PROVIDER_SITE_OTHER): Payer: Medicare Other

## 2012-10-23 VITALS — BP 100/68 | HR 80 | Ht 73.0 in | Wt 165.1 lb

## 2012-10-23 DIAGNOSIS — E43 Unspecified severe protein-calorie malnutrition: Secondary | ICD-10-CM

## 2012-10-23 DIAGNOSIS — K5080 Crohn's disease of both small and large intestine without complications: Secondary | ICD-10-CM | POA: Diagnosis not present

## 2012-10-23 DIAGNOSIS — K551 Chronic vascular disorders of intestine: Secondary | ICD-10-CM | POA: Diagnosis not present

## 2012-10-23 DIAGNOSIS — K8301 Primary sclerosing cholangitis: Secondary | ICD-10-CM

## 2012-10-23 DIAGNOSIS — K8309 Other cholangitis: Secondary | ICD-10-CM

## 2012-10-23 DIAGNOSIS — A0472 Enterocolitis due to Clostridium difficile, not specified as recurrent: Secondary | ICD-10-CM | POA: Diagnosis not present

## 2012-10-23 LAB — HEPATIC FUNCTION PANEL
Albumin: 2.9 g/dL — ABNORMAL LOW (ref 3.5–5.2)
Total Protein: 8.2 g/dL (ref 6.0–8.3)

## 2012-10-23 NOTE — Assessment & Plan Note (Signed)
Puzzling now as feels better w/ vancomycin but last C diff PCR negative Finis vancomycin and then he will update me

## 2012-10-23 NOTE — Patient Instructions (Addendum)
Your physician has requested that you go to the basement for the following lab work before leaving today: Hepatic function panel  Follow up with [place] in 2 months.  Today we have printed information on calcium to read.   I appreciate the opportunity to care for you.

## 2012-10-23 NOTE — Assessment & Plan Note (Addendum)
LFT's were up again Now better No sxs from this that I can tell Recheck LFT's Probable repeat imaging (MR?) later in year though not sure it will change anything

## 2012-10-23 NOTE — Progress Notes (Signed)
  Subjective:    Patient ID: [name], male    DOB: 10-02-1965, 47 y.o.   MRN: [id number]  HPI [name] returns for follow-up last here in July. He has had + and negative C diff PCR. He has been successfully treated with vancomycin 3 times - on third Tx. He says it stops diarrhea.  He is gaining weight and food diary shows that he is eating. No clear trigger of diarrhea.  He is taking Humira. He has been spending nights in his own home 2-3 x a week and also staying with his parents.  LFt's have gone up again but no pruritis or associated abdominal pain Medications, allergies, past medical history, past surgical history, family history and social history are reviewed and updated in the EMR.  Review of Systems As above, mood better - Seeing Dr. Cheryln Manly which helps.    Objective:   Physical Exam General:  NAD Eyes:   anicteric Lungs:  clear Heart:  S1S2 no rubs, murmurs or gallops Abdomen:  soft and nontender, BS+ Ext:   no edema    Data Reviewed:   Last visits and labs       Assessment & Plan:  Regional enteritis of small intestine with large intestine  Primary sclerosing cholangitis ? hepatitis overlap - Plan: Hepatic function panel  Chronic mesenteric ischemia  C. difficile diarrhea  Severe protein-calorie malnutrition

## 2012-10-23 NOTE — Assessment & Plan Note (Addendum)
Improved though seems to get better with vancomycin with or without + C diff PCR

## 2012-10-23 NOTE — Assessment & Plan Note (Signed)
Last pre-albumin lower but weight is better He is eating more food than in past

## 2012-10-23 NOTE — Progress Notes (Signed)
Quick Note:  Liver chemistries are slightly worse but overall similar Repeat in 2 weeks  I will send message by My Chart to him ______

## 2012-10-23 NOTE — Assessment & Plan Note (Addendum)
Not symptomatic from this as best I can tell

## 2012-10-24 ENCOUNTER — Other Ambulatory Visit: Payer: Self-pay

## 2012-10-24 DIAGNOSIS — R7401 Elevation of levels of liver transaminase levels: Secondary | ICD-10-CM

## 2012-10-30 ENCOUNTER — Encounter: Payer: Self-pay | Admitting: Internal Medicine

## 2012-11-01 ENCOUNTER — Telehealth: Payer: Self-pay

## 2012-11-01 NOTE — Telephone Encounter (Signed)
I spoke with the patient's mother.  He is scheduled for a pre-visit for 11/03/12 1:30 and colonoscopy for 11/10/12 3:00

## 2012-11-01 NOTE — Telephone Encounter (Signed)
Message copied by Marlon Pel on Wed Nov 01, 2012  3:24 PM ------      Message from: Silvano Rusk E      Created: Wed Nov 01, 2012  9:21 AM      Regarding: needs colonoscopy       Please set [name] up for a colonoscopy - next week in [place]      My Chart communication - having increasing diarrhea off vancomycin      Last C diff PCR negative ------

## 2012-11-02 ENCOUNTER — Encounter: Payer: Self-pay | Admitting: Internal Medicine

## 2012-11-02 ENCOUNTER — Ambulatory Visit (INDEPENDENT_AMBULATORY_CARE_PROVIDER_SITE_OTHER): Payer: Medicare Other | Admitting: Psychology

## 2012-11-02 DIAGNOSIS — F331 Major depressive disorder, recurrent, moderate: Secondary | ICD-10-CM | POA: Diagnosis not present

## 2012-11-03 ENCOUNTER — Telehealth: Payer: Self-pay | Admitting: Internal Medicine

## 2012-11-03 ENCOUNTER — Other Ambulatory Visit: Payer: Self-pay | Admitting: Internal Medicine

## 2012-11-03 ENCOUNTER — Ambulatory Visit (AMBULATORY_SURGERY_CENTER): Payer: Self-pay | Admitting: *Deleted

## 2012-11-03 VITALS — Ht 73.0 in | Wt 169.2 lb

## 2012-11-03 DIAGNOSIS — R197 Diarrhea, unspecified: Secondary | ICD-10-CM

## 2012-11-03 DIAGNOSIS — A0472 Enterocolitis due to Clostridium difficile, not specified as recurrent: Secondary | ICD-10-CM

## 2012-11-03 MED ORDER — VANCOMYCIN HCL 125 MG PO CAPS: 125.0000 mg | ORAL_CAPSULE | Freq: Four times a day (QID) | ORAL | Status: DC

## 2012-11-03 MED ORDER — MOVIPREP 100 G PO SOLR: ORAL | Status: DC

## 2012-11-03 NOTE — Telephone Encounter (Signed)
Pt states he has a temp of 100.6. He is scheduled for a colon week. States he has fatigue and loose stools like he did when he had c diff. Instructed pt to go to the ER or urgent care if his symptoms got worse. Pt verbalized understanding. [name] notified.

## 2012-11-03 NOTE — Telephone Encounter (Signed)
I spoke to him Am going to restart vancomycin 125 qid He will call me back if not better/worse

## 2012-11-03 NOTE — Progress Notes (Signed)
No allergies to eggs or soy. No problems with anesthesia.

## 2012-11-04 ENCOUNTER — Other Ambulatory Visit: Payer: Self-pay | Admitting: Internal Medicine

## 2012-11-10 ENCOUNTER — Encounter: Payer: Self-pay | Admitting: Internal Medicine

## 2012-11-10 ENCOUNTER — Ambulatory Visit (AMBULATORY_SURGERY_CENTER): Payer: Medicare Other | Admitting: Internal Medicine

## 2012-11-10 VITALS — BP 111/76 | HR 67 | Temp 97.3°F | Resp 23 | Ht 73.0 in | Wt 169.0 lb

## 2012-11-10 DIAGNOSIS — K50819 Crohn's disease of both small and large intestine with unspecified complications: Secondary | ICD-10-CM

## 2012-11-10 DIAGNOSIS — K5289 Other specified noninfective gastroenteritis and colitis: Secondary | ICD-10-CM | POA: Diagnosis not present

## 2012-11-10 DIAGNOSIS — K5080 Crohn's disease of both small and large intestine without complications: Secondary | ICD-10-CM

## 2012-11-10 DIAGNOSIS — R197 Diarrhea, unspecified: Secondary | ICD-10-CM

## 2012-11-10 DIAGNOSIS — K5090 Crohn's disease, unspecified, without complications: Secondary | ICD-10-CM | POA: Diagnosis not present

## 2012-11-10 DIAGNOSIS — F341 Dysthymic disorder: Secondary | ICD-10-CM | POA: Diagnosis not present

## 2012-11-10 DIAGNOSIS — I10 Essential (primary) hypertension: Secondary | ICD-10-CM | POA: Diagnosis not present

## 2012-11-10 DIAGNOSIS — Z86010 Personal history of colonic polyps: Secondary | ICD-10-CM | POA: Diagnosis not present

## 2012-11-10 MED ORDER — SODIUM CHLORIDE 0.9 % IV SOLN: 500.0000 mL | INTRAVENOUS | Status: DC

## 2012-11-10 NOTE — Patient Instructions (Addendum)
The colon looks much better than it did the last time. Still have inflammation but again looks better.  There are some polyps that look inflammatory. I took biopsies. Will call next week.  Stay on the Humira and finisih the vancomycin again - we may end up tapering it off so if you haven't heard from me soon let me know before you run out of it.  I appreciate the opportunity to care for you. [name], MD, FACG   YOU HAD AN ENDOSCOPIC PROCEDURE TODAY AT [facility]: Refer to the procedure report that was given to you for any specific questions about what was found during the examination.  If the procedure report does not answer your questions, please call your gastroenterologist to clarify.  If you requested that your care partner not be given the details of your procedure findings, then the procedure report has been included in a sealed envelope for you to review at your convenience later.  YOU SHOULD EXPECT: Some feelings of bloating in the abdomen. Passage of more gas than usual.  Walking can help get rid of the air that was put into your GI tract during the procedure and reduce the bloating. If you had a lower endoscopy (such as a colonoscopy or flexible sigmoidoscopy) you may notice spotting of blood in your stool or on the toilet paper. If you underwent a bowel prep for your procedure, then you may not have a normal bowel movement for a few days.  DIET: Your first meal following the procedure should be a light meal and then it is ok to progress to your normal diet.  A half-sandwich or bowl of soup is an example of a good first meal.  Heavy or fried foods are harder to digest and may make you feel nauseous or bloated.  Likewise meals heavy in dairy and vegetables can cause extra gas to form and this can also increase the bloating.  Drink plenty of fluids but you should avoid alcoholic beverages for 24 hours.  ACTIVITY: Your care partner should take you home directly after  the procedure.  You should plan to take it easy, moving slowly for the rest of the day.  You can resume normal activity the day after the procedure however you should NOT DRIVE or use heavy machinery for 24 hours (because of the sedation medicines used during the test).    SYMPTOMS TO REPORT IMMEDIATELY: A gastroenterologist can be reached at any hour.  During normal business hours, 8:30 AM to 5:00 PM Monday through Friday, call [phone].  After hours and on weekends, please call the GI answering service at [phone] who will take a message and have the physician on call contact you.   Following lower endoscopy (colonoscopy or flexible sigmoidoscopy):  Excessive amounts of blood in the stool  Significant tenderness or worsening of abdominal pains  Swelling of the abdomen that is new, acute  Fever of 100F or higher    FOLLOW UP: If any biopsies were taken you will be contacted by phone or by letter within the next 1-3 weeks.  Call your gastroenterologist if you have not heard about the biopsies in 3 weeks.  Our staff will call the home number listed on your records the next business day following your procedure to check on you and address any questions or concerns that you may have at that time regarding the information given to you following your procedure. This is a courtesy call and so  if there is no answer at the home number and we have not heard from you through the emergency physician on call, we will assume that you have returned to your regular daily activities without incident.  SIGNATURES/CONFIDENTIALITY: You and/or your care partner have signed paperwork which will be entered into your electronic medical record.  These signatures attest to the fact that that the information above on your After Visit Summary has been reviewed and is understood.  Full responsibility of the confidentiality of this discharge information lies with you and/or your care-partner.   Information on  polyps given to you today

## 2012-11-10 NOTE — Progress Notes (Signed)
Report to pacu rn vss, bbs=clear

## 2012-11-10 NOTE — Op Note (Signed)
[place]  [address] [place], [address]   COLONOSCOPY PROCEDURE REPORT  PATIENT: [name], [name]  MR#: [id number] BIRTHDATE: 1965/05/01 , 47  yrs. old GENDER: Male ENDOSCOPIST: [name], MD, [facility] PROCEDURE DATE:  11/10/2012 PROCEDURE:   Colonoscopy with biopsy First Screening Colonoscopy - Avg.  risk and is 50 yrs.  old or older - No.  Prior Negative Screening - Now for repeat screening. N/A  History of Adenoma - Now for follow-up colonoscopy & has been > or = to 3 yrs.  N/A ASA CLASS:   Class II INDICATIONS: Crohn's Disease.   Diarrhea Hx ischemic colitis MEDICATIONS: propofol (Diprivan) 372m IV, MAC sedation, administered by CRNA, and These medications were titrated to patient response per physician's verbal order  DESCRIPTION OF PROCEDURE:   After the risks benefits and alternatives of the procedure were thoroughly explained, informed consent was obtained.  A digital rectal exam revealed no abnormalities of the rectum, A digital rectal exam revealed the prostate was not enlarged, and A digital rectal exam revealed no prostatic nodules.   The LB [id number] endoscope was introduced through the anus and advanced to the terminal ileum which was intubated for a short distance. No adverse events experienced.   The quality of the prep was Suprep good  The instrument was then slowly withdrawn as the colon was fully examined.   COLON FINDINGS: The mucosa appeared normal in the terminal ileum. Small to medium sized patches of abnormal mucosa was found throughout the entire examined colon.  The mucosa was ulcerated and had pseudopolyps.  This was likely consistent with Crohn's disease. Multiple biopsies were performed using cold forceps.  Retroflexed views revealed internal hemorrhoids. The time to cecum=5 minutes 37 seconds.  Withdrawal time=8 minutes 04 seconds.  The scope was withdrawn and the procedure completed. COMPLICATIONS: There  were no complications.  ENDOSCOPIC IMPRESSION: 1.   Normal mucosa in the terminal ileum 2.   Small to medium sized patches of abnormal mucosa was found throughout the entire examined colon; multiple biopsies were performed using cold forceps - c/ crohn's and pseudopolyps. One larger right colon polyp biopsied (specific).  RECOMMENDATIONS: Office will call with the results. Stay on Humira and vancomycin (Hx C diff w/ ? recurrence)  eSigned:  CGatha Mayer MD, F[facility]09/01/2013 3:05 PM   cc: The Patient and RLona KettleMD

## 2012-11-10 NOTE — Progress Notes (Signed)
Patient did not experience any of the following events: a burn prior to discharge; a fall within the facility; wrong site/side/patient/procedure/implant event; or a hospital transfer or hospital admission upon discharge from the facility. (G8907) Patient did not have preoperative order for IV antibiotic SSI prophylaxis. (G8918)  

## 2012-11-10 NOTE — Progress Notes (Signed)
Called to room to assist during endoscopic procedure.  Patient ID and intended procedure confirmed with present staff. Received instructions for my participation in the procedure from the performing physician.  

## 2012-11-13 ENCOUNTER — Telehealth: Payer: Self-pay | Admitting: *Deleted

## 2012-11-13 ENCOUNTER — Other Ambulatory Visit: Payer: Self-pay | Admitting: Internal Medicine

## 2012-11-13 ENCOUNTER — Encounter: Payer: Self-pay | Admitting: Internal Medicine

## 2012-11-13 MED ORDER — VANCOMYCIN HCL 125 MG PO CAPS: 125.0000 mg | ORAL_CAPSULE | Freq: Two times a day (BID) | ORAL | Status: AC

## 2012-11-13 NOTE — Telephone Encounter (Signed)
Patient was not called, he refused follow up phone call during admitting.

## 2012-11-17 ENCOUNTER — Encounter: Payer: Self-pay | Admitting: Internal Medicine

## 2012-11-17 NOTE — Progress Notes (Signed)
Quick Note:  Shows colitis He is on Humira and also tapering vancomycin 1 year recall colon  I will message him via My Chart so no letter needed ______

## 2012-11-21 ENCOUNTER — Ambulatory Visit (INDEPENDENT_AMBULATORY_CARE_PROVIDER_SITE_OTHER): Payer: Medicare Other | Admitting: Psychology

## 2012-11-21 DIAGNOSIS — F331 Major depressive disorder, recurrent, moderate: Secondary | ICD-10-CM

## 2012-11-22 ENCOUNTER — Encounter: Payer: Self-pay | Admitting: Internal Medicine

## 2012-11-24 ENCOUNTER — Other Ambulatory Visit: Payer: Self-pay | Admitting: Dermatology

## 2012-11-24 DIAGNOSIS — C44611 Basal cell carcinoma of skin of unspecified upper limb, including shoulder: Secondary | ICD-10-CM | POA: Diagnosis not present

## 2012-11-24 DIAGNOSIS — D485 Neoplasm of uncertain behavior of skin: Secondary | ICD-10-CM | POA: Diagnosis not present

## 2012-11-24 DIAGNOSIS — D239 Other benign neoplasm of skin, unspecified: Secondary | ICD-10-CM | POA: Diagnosis not present

## 2012-11-24 DIAGNOSIS — L570 Actinic keratosis: Secondary | ICD-10-CM | POA: Diagnosis not present

## 2012-11-24 DIAGNOSIS — D235 Other benign neoplasm of skin of trunk: Secondary | ICD-10-CM | POA: Diagnosis not present

## 2012-11-24 DIAGNOSIS — L821 Other seborrheic keratosis: Secondary | ICD-10-CM | POA: Diagnosis not present

## 2012-11-24 DIAGNOSIS — Z85828 Personal history of other malignant neoplasm of skin: Secondary | ICD-10-CM | POA: Diagnosis not present

## 2012-11-24 DIAGNOSIS — Q828 Other specified congenital malformations of skin: Secondary | ICD-10-CM | POA: Diagnosis not present

## 2012-12-06 ENCOUNTER — Encounter: Payer: Self-pay | Admitting: Internal Medicine

## 2012-12-08 ENCOUNTER — Ambulatory Visit: Payer: Medicare Other | Admitting: Psychology

## 2012-12-11 ENCOUNTER — Ambulatory Visit (INDEPENDENT_AMBULATORY_CARE_PROVIDER_SITE_OTHER): Payer: Medicare Other | Admitting: Psychology

## 2012-12-11 DIAGNOSIS — F331 Major depressive disorder, recurrent, moderate: Secondary | ICD-10-CM | POA: Diagnosis not present

## 2012-12-18 ENCOUNTER — Encounter: Payer: Self-pay | Admitting: Internal Medicine

## 2012-12-26 ENCOUNTER — Encounter: Payer: Self-pay | Admitting: Internal Medicine

## 2012-12-26 ENCOUNTER — Ambulatory Visit (INDEPENDENT_AMBULATORY_CARE_PROVIDER_SITE_OTHER): Payer: Medicare Other | Admitting: Internal Medicine

## 2012-12-26 ENCOUNTER — Other Ambulatory Visit (INDEPENDENT_AMBULATORY_CARE_PROVIDER_SITE_OTHER): Payer: Medicare Other

## 2012-12-26 VITALS — BP 100/70 | HR 80 | Ht 73.0 in | Wt 176.2 lb

## 2012-12-26 DIAGNOSIS — F329 Major depressive disorder, single episode, unspecified: Secondary | ICD-10-CM

## 2012-12-26 DIAGNOSIS — K8309 Other cholangitis: Secondary | ICD-10-CM

## 2012-12-26 DIAGNOSIS — E559 Vitamin D deficiency, unspecified: Secondary | ICD-10-CM

## 2012-12-26 DIAGNOSIS — K8301 Primary sclerosing cholangitis: Secondary | ICD-10-CM

## 2012-12-26 DIAGNOSIS — IMO0002 Reserved for concepts with insufficient information to code with codable children: Secondary | ICD-10-CM

## 2012-12-26 DIAGNOSIS — Z23 Encounter for immunization: Secondary | ICD-10-CM

## 2012-12-26 DIAGNOSIS — M899 Disorder of bone, unspecified: Secondary | ICD-10-CM

## 2012-12-26 DIAGNOSIS — R21 Rash and other nonspecific skin eruption: Secondary | ICD-10-CM

## 2012-12-26 DIAGNOSIS — A0472 Enterocolitis due to Clostridium difficile, not specified as recurrent: Secondary | ICD-10-CM | POA: Diagnosis not present

## 2012-12-26 DIAGNOSIS — E43 Unspecified severe protein-calorie malnutrition: Secondary | ICD-10-CM

## 2012-12-26 DIAGNOSIS — F3289 Other specified depressive episodes: Secondary | ICD-10-CM

## 2012-12-26 DIAGNOSIS — R7401 Elevation of levels of liver transaminase levels: Secondary | ICD-10-CM | POA: Diagnosis not present

## 2012-12-26 DIAGNOSIS — K551 Chronic vascular disorders of intestine: Secondary | ICD-10-CM

## 2012-12-26 DIAGNOSIS — M8580 Other specified disorders of bone density and structure, unspecified site: Secondary | ICD-10-CM

## 2012-12-26 LAB — CBC WITH DIFFERENTIAL/PLATELET
Basophils Absolute: 0.1 10*3/uL (ref 0.0–0.1)
Eosinophils Absolute: 0.3 10*3/uL (ref 0.0–0.7)
Lymphocytes Relative: 29.7 % (ref 12.0–46.0)
MCHC: 33.8 g/dL (ref 30.0–36.0)
MCV: 89.6 fl (ref 78.0–100.0)
Monocytes Absolute: 1.3 10*3/uL — ABNORMAL HIGH (ref 0.1–1.0)
Monocytes Relative: 22.2 % — ABNORMAL HIGH (ref 3.0–12.0)
Neutro Abs: 2.5 10*3/uL (ref 1.4–7.7)
Neutrophils Relative %: 41.7 % — ABNORMAL LOW (ref 43.0–77.0)
RDW: 16.9 % — ABNORMAL HIGH (ref 11.5–14.6)

## 2012-12-26 LAB — COMPREHENSIVE METABOLIC PANEL
ALT: 558 U/L — ABNORMAL HIGH (ref 0–53)
AST: 452 U/L — ABNORMAL HIGH (ref 0–37)
Albumin: 2.5 g/dL — ABNORMAL LOW (ref 3.5–5.2)
Calcium: 8.6 mg/dL (ref 8.4–10.5)
Chloride: 102 mEq/L (ref 96–112)
Creatinine, Ser: 1.1 mg/dL (ref 0.4–1.5)
Potassium: 3.8 mEq/L (ref 3.5–5.1)
Sodium: 137 mEq/L (ref 135–145)
Total Protein: 7.9 g/dL (ref 6.0–8.3)

## 2012-12-26 LAB — HEPATIC FUNCTION PANEL
ALT: 558 U/L — ABNORMAL HIGH (ref 0–53)
Alkaline Phosphatase: 131 U/L — ABNORMAL HIGH (ref 39–117)
Total Bilirubin: 0.9 mg/dL (ref 0.3–1.2)
Total Protein: 7.9 g/dL (ref 6.0–8.3)

## 2012-12-26 NOTE — Assessment & Plan Note (Signed)
Seems resolved

## 2012-12-26 NOTE — Assessment & Plan Note (Signed)
Symptomatically improved

## 2012-12-26 NOTE — Patient Instructions (Addendum)
Your physician has requested that you go to the basement for lab work before leaving today.  Today you have been given a flu vaccine.   You have been set up for a DEXA scan here in our basement on 02/06/13 at 11:00am.  Call [phone] if you need to change this time.  I appreciate the opportunity to care for you.

## 2012-12-26 NOTE — Assessment & Plan Note (Signed)
Repeat DEXA in Dec

## 2012-12-26 NOTE — Assessment & Plan Note (Signed)
Recheck level

## 2012-12-26 NOTE — Assessment & Plan Note (Signed)
Seems resolved Prealbumin check

## 2012-12-26 NOTE — Assessment & Plan Note (Addendum)
Recheck LFT's Check Ig's Cross section imaging next year Liver bx?? He declines ? Other Tx -  After reviewing his LFT's again I will ask him again about liver bx. I think would be helpful.

## 2012-12-26 NOTE — Assessment & Plan Note (Signed)
Much better 

## 2012-12-27 ENCOUNTER — Ambulatory Visit (INDEPENDENT_AMBULATORY_CARE_PROVIDER_SITE_OTHER): Payer: Medicare Other | Admitting: Psychology

## 2012-12-27 DIAGNOSIS — F331 Major depressive disorder, recurrent, moderate: Secondary | ICD-10-CM

## 2012-12-27 LAB — IGG, IGA, IGM
IgA: 203 mg/dL (ref 68–379)
IgG (Immunoglobin G), Serum: 3060 mg/dL — ABNORMAL HIGH (ref 650–1600)
IgM, Serum: 221 mg/dL (ref 41–251)

## 2012-12-27 LAB — PREALBUMIN: Prealbumin: 6.9 mg/dL — ABNORMAL LOW (ref 17.0–34.0)

## 2012-12-31 DIAGNOSIS — R21 Rash and other nonspecific skin eruption: Secondary | ICD-10-CM | POA: Insufficient documentation

## 2012-12-31 NOTE — Progress Notes (Signed)
Subjective:    Patient ID: [name], male    DOB: Jan 17, 1966, 47 y.o.   MRN: [id number]  HPI The patient returns for routine f/u of Crohn's disease, recent recurrent C diff, mesenteric ischemia and PSC/autoimmune hepatitis overlap syndrome.  He is off vancomycin after several courses and notes formed stools most of time. He has been eating "too well, too many cookies" and is trying to cut back. His mood is improving - he continues to see Dr. Cheryln Manly. He is spending time in his own home again. Stronger. Sleeping much better.  Not c/o abdominal pain.  Allergies  Allergen Reactions  . Asacol [Mesalamine]     abd pain  . Azathioprine     abd pain  . Mycophenolate Mofetil     abd pain  . Other     NUTS; constipation  . Wheat Bran     Constipation; flatulence; abd pain   Outpatient Prescriptions Prior to Visit  Medication Sig Dispense Refill  . ascorbic acid (VITAMIN C) 1000 MG tablet Take 1,000 mg by mouth daily.      . cholecalciferol (VITAMIN D) 1000 UNITS tablet Take 1,000 Units by mouth daily.      Marland Kitchen diltiazem 2 % GEL Apply to rectum 2 - 3 times a day  30 g  0  . diphenoxylate-atropine (LOMOTIL) 2.5-0.025 MG per tablet 1-2 tablets po qid prn diarrhea  120 tablet  0  . fluticasone (FLONASE) 50 MCG/ACT nasal spray Place 2 sprays into the nose daily. Each nostril      . HUMIRA PEN 40 MG/0.8ML injection Inject 40 mg into the skin every 14 (fourteen) days.      . hydrOXYzine (VISTARIL) 25 MG capsule Take 25 mg by mouth 3 (three) times daily as needed for itching. Patient unsure of dosage      . mirtazapine (REMERON) 30 MG tablet Take 30 mg by mouth at bedtime.      . traMADol (ULTRAM) 50 MG tablet Take 50 mg by mouth every 6 (six) hours as needed for pain.       No facility-administered medications prior to visit.   Past Medical History  Diagnosis Date  . Crohn's disease of small and large intestines   . Hypercholesterolemia   . History of alcohol abuse   . History of  substance abuse   . Depression   . 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 Primary sclerosing cholangitis     ? hepatitis overlap - liver bx x 2 and MRCP  . Allergy     SEASONAL  . Anxiety   . Cataract     BILATERAL/REMOVED  . Hypertension   . Personal history of adenomatous colonic polyps 12/2010, 03/2012    12/2010 - 8 mm serrated adenoma of rectum  . Chronic mesenteric ischemia   . Basal cell carcinoma of lower extremity 06/2011    on left leg  . Dysplastic nevus 06/2011    on right back.   . Suicide and self-inflicted poisoning by other sedatives and hypnotics 05/18/2012  . Major depressive disorder, recurrent episode, severe, without mention of psychotic behavior 04/12/2012  . C. difficile diarrhea 10/23/2012   Past Surgical History  Procedure Laterality Date  . Cataract extraction, bilateral    . Pylonidal cyst removal    . Foot surgery      right  . Percutaneous liver biopsy  2007 and 2008  . Colonoscopy  2001, 05/02/2003, 01/28/11    2012: Right  colon Crohn's, rectal polyp  . Esophagogastroduodenoscopy  01/28/11    Normal  . Colonoscopy  03/31/2012    Procedure: COLONOSCOPY;  Surgeon: Jerene Bears, MD;  Location: [facility];  Service: Gastroenterology;  Laterality: N/A;          Review of Systems As per HPI    Objective:   Physical Exam General:  NAD Eyes:   anicteric Lungs:  clear Heart:  S1S2 no rubs, murmurs or gallops Abdomen:  soft and nontender, BS+, no ascites detected Ext:   no edema Skin:  Red papular rash on anterior trunk    Data Reviewed:  Lab Results  Component Value Date   ALT 558* 12/26/2012   ALT 558* 12/26/2012   AST 452* 12/26/2012   AST 452* 12/26/2012   ALKPHOS 131* 12/26/2012   ALKPHOS 131* 12/26/2012   BILITOT 0.9 12/26/2012   BILITOT 0.9 12/26/2012   Lab Results  Component Value Date   WBC 6.1 12/26/2012   HGB 13.0 12/26/2012   HCT 38.5* 12/26/2012   MCV 89.6 12/26/2012   PLT 170.0 12/26/2012     Wt Readings  from Last 3 Encounters:  12/26/12 176 lb 3.2 oz (79.924 kg)  11/10/12 169 lb (76.658 kg)  11/03/12 169 lb 3.2 oz (76.749 kg)        Assessment & Plan:  C. difficile diarrhea - Plan: IgG, IgA, IgM, CBC with Differential, Comp Met (CMET)  Primary sclerosing cholangitis ? hepatitis overlap - Plan: IgG, IgA, IgM, CBC with Differential, Comp Met (CMET)  Chronic mesenteric ischemia - Plan: IgG, IgA, IgM, CBC with Differential, Comp Met (CMET)  VITAMIN D DEFICIENCY - Plan: Vitamin D (25 hydroxy)  Osteopenia - Plan: DG Bone Density  DEPRESSION  Severe protein-calorie malnutrition - Plan: Prealbumin  Need for prophylactic vaccination and inoculation against influenza - Plan: Flu Vaccine QUAD 36+ mos IM  Long-term current use of steroids  Rash on trunk  Current outpatient prescriptions:ascorbic acid (VITAMIN C) 1000 MG tablet, Take 1,000 mg by mouth daily., Disp: , Rfl: ;  cholecalciferol (VITAMIN D) 1000 UNITS tablet, Take 1,000 Units by mouth daily., Disp: , Rfl: ;  diltiazem 2 % GEL, Apply to rectum 2 - 3 times a day, Disp: 30 g, Rfl: 0;  diphenoxylate-atropine (LOMOTIL) 2.5-0.025 MG per tablet, 1-2 tablets po qid prn diarrhea, Disp: 120 tablet, Rfl: 0 fluticasone (FLONASE) 50 MCG/ACT nasal spray, Place 2 sprays into the nose daily. Each nostril, Disp: , Rfl: ;  HUMIRA PEN 40 MG/0.8ML injection, Inject 40 mg into the skin every 14 (fourteen) days., Disp: , Rfl: ;  hydrOXYzine (ATARAX/VISTARIL) 25 MG tablet, , Disp: , Rfl: ;  hydrOXYzine (VISTARIL) 25 MG capsule, Take 25 mg by mouth 3 (three) times daily as needed for itching. Patient unsure of dosage, Disp: , Rfl:  mirtazapine (REMERON) 30 MG tablet, Take 30 mg by mouth at bedtime., Disp: , Rfl: ;  traMADol (ULTRAM) 50 MG tablet, Take 50 mg by mouth every 6 (six) hours as needed for pain., Disp: , Rfl:   Cc: Melinda Crutch, MD Ruta Hinds, MD

## 2012-12-31 NOTE — Assessment & Plan Note (Signed)
DEXA scan due

## 2012-12-31 NOTE — Assessment & Plan Note (Signed)
Observe for now

## 2013-01-02 ENCOUNTER — Encounter: Payer: Self-pay | Admitting: Internal Medicine

## 2013-01-02 NOTE — Progress Notes (Signed)
Quick Note:  Have advised repeating liver bx via My Chart ______

## 2013-01-04 ENCOUNTER — Telehealth: Payer: Self-pay

## 2013-01-04 ENCOUNTER — Encounter (HOSPITAL_COMMUNITY): Payer: Self-pay | Admitting: Pharmacy Technician

## 2013-01-04 DIAGNOSIS — K8301 Primary sclerosing cholangitis: Secondary | ICD-10-CM

## 2013-01-04 DIAGNOSIS — K754 Autoimmune hepatitis: Secondary | ICD-10-CM

## 2013-01-04 NOTE — Progress Notes (Signed)
Quick Note:  Please arrange a liver biopsy (IR-US guided) Dx PSC, Autoimmune hepatitis ______

## 2013-01-04 NOTE — Telephone Encounter (Signed)
I have left a message for the patient that the order for the liver biopsy he discussed with [name] has been placed and that the hospital will contact him to schedule.

## 2013-01-05 ENCOUNTER — Telehealth: Payer: Self-pay

## 2013-01-05 ENCOUNTER — Other Ambulatory Visit: Payer: Self-pay | Admitting: Radiology

## 2013-01-05 DIAGNOSIS — K8301 Primary sclerosing cholangitis: Secondary | ICD-10-CM

## 2013-01-05 DIAGNOSIS — K5090 Crohn's disease, unspecified, without complications: Secondary | ICD-10-CM

## 2013-01-05 NOTE — Telephone Encounter (Signed)
Message copied by Marlon Pel on Fri Jan 05, 2013  9:17 AM ------      Message from: Silvano Rusk E      Created: Thu Jan 04, 2013  4:56 PM      Regarding: dietitian eval       Please arrange dietitian evaluation for malnutrition in setting of Crohn's, [place].             ------

## 2013-01-05 NOTE — Telephone Encounter (Signed)
Left message for patient to call back Patient will need to call [phone] to schedule his appt

## 2013-01-05 NOTE — Telephone Encounter (Signed)
Patient advised to call nutrition to schedule an appt.  He is provided the number

## 2013-01-11 ENCOUNTER — Ambulatory Visit (HOSPITAL_COMMUNITY)
Admission: RE | Admit: 2013-01-11 | Discharge: 2013-01-11 | Disposition: A | Discharge status: Home / Self Care | Payer: Medicare Other | Source: Ambulatory Visit | Attending: Internal Medicine | Admitting: Internal Medicine

## 2013-01-11 ENCOUNTER — Encounter (HOSPITAL_COMMUNITY): Payer: Self-pay

## 2013-01-11 DIAGNOSIS — K7689 Other specified diseases of liver: Secondary | ICD-10-CM | POA: Diagnosis not present

## 2013-01-11 DIAGNOSIS — K739 Chronic hepatitis, unspecified: Secondary | ICD-10-CM | POA: Insufficient documentation

## 2013-01-11 DIAGNOSIS — K754 Autoimmune hepatitis: Secondary | ICD-10-CM

## 2013-01-11 DIAGNOSIS — K7460 Unspecified cirrhosis of liver: Secondary | ICD-10-CM | POA: Diagnosis not present

## 2013-01-11 DIAGNOSIS — K738 Other chronic hepatitis, not elsewhere classified: Secondary | ICD-10-CM | POA: Diagnosis not present

## 2013-01-11 DIAGNOSIS — K8301 Primary sclerosing cholangitis: Secondary | ICD-10-CM

## 2013-01-11 LAB — CBC
HCT: 37.4 % — ABNORMAL LOW (ref 39.0–52.0)
MCH: 30.6 pg (ref 26.0–34.0)
MCHC: 34.5 g/dL (ref 30.0–36.0)
MCV: 88.6 fL (ref 78.0–100.0)
RBC: 4.22 MIL/uL (ref 4.22–5.81)
RDW: 16 % — ABNORMAL HIGH (ref 11.5–15.5)

## 2013-01-11 LAB — PROTIME-INR: Prothrombin Time: 14.1 seconds (ref 11.6–15.2)

## 2013-01-11 LAB — APTT: aPTT: 35 seconds (ref 24–37)

## 2013-01-11 MED ORDER — HYDROCODONE-ACETAMINOPHEN 5-325 MG PO TABS
1.0000 | ORAL_TABLET | ORAL | Status: DC | PRN
  Filled 2013-01-11: qty 2

## 2013-01-11 MED ORDER — MIDAZOLAM HCL 2 MG/2ML IJ SOLN
INTRAMUSCULAR | Status: AC | PRN
  Administered 2013-01-11: 2 mg via INTRAVENOUS
  Administered 2013-01-11: 1 mg via INTRAVENOUS

## 2013-01-11 MED ORDER — FENTANYL CITRATE 0.05 MG/ML IJ SOLN
INTRAMUSCULAR | Status: AC
  Filled 2013-01-11: qty 4

## 2013-01-11 MED ORDER — NALOXONE HCL 0.4 MG/ML IJ SOLN
INTRAMUSCULAR | Status: AC
  Filled 2013-01-11: qty 1

## 2013-01-11 MED ORDER — FLUMAZENIL 0.5 MG/5ML IV SOLN
INTRAVENOUS | Status: AC
  Filled 2013-01-11: qty 5

## 2013-01-11 MED ORDER — SODIUM CHLORIDE 0.9 % IV SOLN
INTRAVENOUS | Status: DC
  Administered 2013-01-11: 09:00:00 via INTRAVENOUS

## 2013-01-11 MED ORDER — FENTANYL CITRATE 0.05 MG/ML IJ SOLN
INTRAMUSCULAR | Status: AC | PRN
  Administered 2013-01-11: 100 ug via INTRAVENOUS

## 2013-01-11 MED ORDER — MIDAZOLAM HCL 2 MG/2ML IJ SOLN
INTRAMUSCULAR | Status: AC
  Filled 2013-01-11: qty 4

## 2013-01-11 NOTE — Procedures (Signed)
Interventional Radiology Procedure Note  Procedure:  US guided random core liver biopsy. Complications:  None immediate Recommendations: - Bedrest x 3 hrs - ADAT after 1 hour  Signed,  Criselda Peaches, MD [facility]

## 2013-01-11 NOTE — H&P (Signed)
Chief Complaint: "I'm here for a liver biopsy" Referring Physician:Gessner HPI: [name] is an 47 y.o. male with hx of sclerosing cholangitis and possibly autoimmune hepatitis. He is in process of workup and is referred for random liver biopsy. Has had prior liver biopsies in 2007 and 6160 without complication. PMHx and meds reviewed. Pt feels ok otherwise, no recent fevers, illness.   Past Medical History:  Past Medical History  Diagnosis Date  . Crohn's disease of small and large intestines   . Hypercholesterolemia   . History of alcohol abuse   . History of substance abuse   . Depression   . 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 Primary sclerosing cholangitis     ? hepatitis overlap - liver bx x 2 and MRCP  . Allergy     SEASONAL  . Anxiety   . Cataract     BILATERAL/REMOVED  . Hypertension   . Personal history of adenomatous colonic polyps 12/2010, 03/2012    12/2010 - 8 mm serrated adenoma of rectum  . Chronic mesenteric ischemia   . Basal cell carcinoma of lower extremity 06/2011    on left leg  . Dysplastic nevus 06/2011    on right back.   . Suicide and self-inflicted poisoning by other sedatives and hypnotics 05/18/2012  . Major depressive disorder, recurrent episode, severe, without mention of psychotic behavior 04/12/2012  . C. difficile diarrhea 10/23/2012    Past Surgical History:  Past Surgical History  Procedure Laterality Date  . Cataract extraction, bilateral    . Pylonidal cyst removal    . Foot surgery      right  . Percutaneous liver biopsy  2007 and 2008  . Colonoscopy  2001, 05/02/2003, 01/28/11    2012: Right colon Crohn's, rectal polyp  . Esophagogastroduodenoscopy  01/28/11    Normal  . Colonoscopy  03/31/2012    Procedure: COLONOSCOPY;  Surgeon: Jerene Bears, MD;  Location: [facility] ENDOSCOPY;  Service: Gastroenterology;  Laterality: N/A;    Family History:  Family History  Problem Relation Age of Onset  . Heart disease Father   .  Thyroid cancer Father   . Breast cancer Mother   . Stomach cancer Mother   . Colon cancer Neg Hx     Social History:  reports that he quit smoking about 15 years ago. His smoking use included Cigarettes. He smoked 0.00 packs per day. He has never used smokeless tobacco. He reports that he does not drink alcohol or use illicit drugs.  Allergies:  Allergies  Allergen Reactions  . Asacol [Mesalamine]     abd pain  . Azathioprine     abd pain  . Mycophenolate Mofetil     abd pain  . Other     NUTS; constipation  . Wheat Bran     Constipation; flatulence; abd pain    Medications:   Medication List    ASK your doctor about these medications       ascorbic acid 1000 MG tablet  Commonly known as:  VITAMIN C  Take 1,000 mg by mouth daily.     cholecalciferol 1000 UNITS tablet  Commonly known as:  VITAMIN D  Take 1,000 Units by mouth 2 (two) times daily.     fluticasone 50 MCG/ACT nasal spray  Commonly known as:  FLONASE  Place 2 sprays into the nose daily. Each nostril     HUMIRA PEN 40 MG/0.8ML injection  Generic drug:  adalimumab  Inject 40 mg into the skin every 14 (fourteen) days.     hydrOXYzine 25 MG tablet  Commonly known as:  ATARAX/VISTARIL  Take 25 mg by mouth at bedtime.     REMERON 30 MG tablet  Generic drug:  mirtazapine  Take 30 mg by mouth at bedtime.     SUPER CAL-MAG-D 500-250-125 MG-MG-UNIT Tabs  Generic drug:  Calcium-Magnesium-Vitamin D  Take 1 tablet by mouth 2 (two) times daily.     traMADol 50 MG tablet  Commonly known as:  ULTRAM  Take 50 mg by mouth every 6 (six) hours as needed for pain.        Please HPI for pertinent positives, otherwise complete 10 system ROS negative.  Physical Exam: BP 109/72  Pulse 73  Temp(Src) 98 F (36.7 C) (Oral)  Resp 16  Ht 6' 1"  (1.854 m)  Wt 176 lb (79.833 kg)  BMI 23.23 kg/m2  SpO2 97% Body mass index is 23.23 kg/(m^2).   General Appearance:  Alert, cooperative, no distress, appears stated  age  Head:  Normocephalic, without obvious abnormality, atraumatic  ENT: Unremarkable  Neck: Supple, symmetrical, trachea midline  Lungs:   Clear to auscultation bilaterally, no w/r/r  Chest Wall:  No tenderness or deformity  Heart:  Regular rate and rhythm, S1, S2 normal, no murmur, rub or gallop.  Abdomen:   Soft, non-tender, non distended.  Neurologic: Normal affect, no gross deficits.   Results for orders placed during the hospital encounter of 01/11/13 (from the past 48 hour(s))  APTT     Status: None   Collection Time    01/11/13  8:20 AM      Result Value Range   aPTT 35  24 - 37 seconds  CBC     Status: Abnormal   Collection Time    01/11/13  8:20 AM      Result Value Range   WBC 6.3  4.0 - 10.5 K/uL   RBC 4.22  4.22 - 5.81 MIL/uL   Hemoglobin 12.9 (*) 13.0 - 17.0 g/dL   HCT 37.4 (*) 39.0 - 52.0 %   MCV 88.6  78.0 - 100.0 fL   MCH 30.6  26.0 - 34.0 pg   MCHC 34.5  30.0 - 36.0 g/dL   RDW 16.0 (*) 11.5 - 15.5 %   Platelets 167  150 - 400 K/uL  PROTIME-INR     Status: None   Collection Time    01/11/13  8:20 AM      Result Value Range   Prothrombin Time 14.1  11.6 - 15.2 seconds   INR 1.11  0.00 - 1.49   No results found.  Assessment/Plan Sclerosing cholangitis with possible autoimmune hepatitis Discussed US guided liver biopsy. Explained procedure, risks, complications, use of sedation. Labs reviewed, ok Consent signed in chart   Ascencion Dike PA-C 01/11/2013, 8:58 AM

## 2013-01-16 ENCOUNTER — Ambulatory Visit (INDEPENDENT_AMBULATORY_CARE_PROVIDER_SITE_OTHER): Payer: Medicare Other | Admitting: Psychology

## 2013-01-16 DIAGNOSIS — F331 Major depressive disorder, recurrent, moderate: Secondary | ICD-10-CM

## 2013-01-18 ENCOUNTER — Encounter: Payer: Self-pay | Admitting: Internal Medicine

## 2013-01-19 ENCOUNTER — Encounter: Payer: Self-pay | Admitting: Internal Medicine

## 2013-01-19 ENCOUNTER — Other Ambulatory Visit: Payer: Self-pay

## 2013-01-19 DIAGNOSIS — K7460 Unspecified cirrhosis of liver: Secondary | ICD-10-CM

## 2013-01-19 DIAGNOSIS — K754 Autoimmune hepatitis: Secondary | ICD-10-CM

## 2013-01-19 DIAGNOSIS — K8301 Primary sclerosing cholangitis: Secondary | ICD-10-CM

## 2013-01-19 HISTORY — DX: Autoimmune hepatitis: K75.4

## 2013-01-19 NOTE — Progress Notes (Signed)
Quick Note:  Called biopsy results - ongoing liver inflammation and fibrosis/early cirrhosis  1) Start prednisone 40 mg daily ( I sent Rx) 2) Labs in 2 weeks - LFT's, CA19-9, TPMT phenotype ([facility]), AMA, pANCA, anti-liver/kdney microsomal antibodies ______

## 2013-01-22 ENCOUNTER — Other Ambulatory Visit: Payer: Self-pay | Admitting: Internal Medicine

## 2013-01-22 DIAGNOSIS — K5080 Crohn's disease of both small and large intestine without complications: Secondary | ICD-10-CM

## 2013-01-22 MED ORDER — ADALIMUMAB 40 MG/0.8ML ~~LOC~~ KIT: 40.0000 mg | PACK | SUBCUTANEOUS | Status: DC

## 2013-02-02 ENCOUNTER — Other Ambulatory Visit (INDEPENDENT_AMBULATORY_CARE_PROVIDER_SITE_OTHER): Payer: Medicare Other

## 2013-02-02 DIAGNOSIS — K8301 Primary sclerosing cholangitis: Secondary | ICD-10-CM

## 2013-02-02 DIAGNOSIS — K8309 Other cholangitis: Secondary | ICD-10-CM

## 2013-02-02 DIAGNOSIS — K7460 Unspecified cirrhosis of liver: Secondary | ICD-10-CM

## 2013-02-02 LAB — HEPATIC FUNCTION PANEL
ALT: 347 U/L — ABNORMAL HIGH (ref 0–53)
AST: 151 U/L — ABNORMAL HIGH (ref 0–37)
Albumin: 2.4 g/dL — ABNORMAL LOW (ref 3.5–5.2)
Alkaline Phosphatase: 149 U/L — ABNORMAL HIGH (ref 39–117)
Bilirubin, Direct: 0.1 mg/dL (ref 0.0–0.3)
Total Protein: 6.7 g/dL (ref 6.0–8.3)

## 2013-02-02 NOTE — Progress Notes (Signed)
Quick Note:  Improving LFTs ______

## 2013-02-03 LAB — CANCER ANTIGEN 19-9: CA 19-9: 30.1 U/mL (ref <35.0)

## 2013-02-06 ENCOUNTER — Ambulatory Visit (INDEPENDENT_AMBULATORY_CARE_PROVIDER_SITE_OTHER)
Admission: RE | Admit: 2013-02-06 | Discharge: 2013-02-06 | Disposition: A | Discharge status: Home / Self Care | Payer: Medicare Other | Source: Ambulatory Visit | Attending: Internal Medicine | Admitting: Internal Medicine

## 2013-02-06 ENCOUNTER — Encounter: Payer: Self-pay | Admitting: Internal Medicine

## 2013-02-06 DIAGNOSIS — M899 Disorder of bone, unspecified: Secondary | ICD-10-CM | POA: Diagnosis not present

## 2013-02-06 DIAGNOSIS — M8580 Other specified disorders of bone density and structure, unspecified site: Secondary | ICD-10-CM

## 2013-02-06 LAB — MITOCHONDRIAL ANTIBODIES: Mitochondrial M2 Ab, IgG: 0.54 (ref <0.91)

## 2013-02-12 ENCOUNTER — Encounter: Payer: Self-pay | Admitting: Internal Medicine

## 2013-02-12 LAB — THIOPURINE METHYLTRANSFERASE (TPMT), RBC: Thiopurine Methyltransferase, RBC: 18 (ref >12)

## 2013-02-13 ENCOUNTER — Other Ambulatory Visit: Payer: Self-pay | Admitting: Internal Medicine

## 2013-02-13 ENCOUNTER — Encounter: Payer: Self-pay | Admitting: Internal Medicine

## 2013-02-13 DIAGNOSIS — K754 Autoimmune hepatitis: Secondary | ICD-10-CM

## 2013-02-13 MED ORDER — PREDNISONE 20 MG PO TABS: 40.0000 mg | ORAL_TABLET | Freq: Every day | ORAL | Status: DC

## 2013-02-13 NOTE — Assessment & Plan Note (Signed)
Refill prednisone, LFT's are improving

## 2013-02-26 ENCOUNTER — Other Ambulatory Visit (INDEPENDENT_AMBULATORY_CARE_PROVIDER_SITE_OTHER): Payer: Medicare Other

## 2013-02-26 ENCOUNTER — Other Ambulatory Visit: Payer: Self-pay

## 2013-02-26 DIAGNOSIS — R7401 Elevation of levels of liver transaminase levels: Secondary | ICD-10-CM | POA: Diagnosis not present

## 2013-02-26 LAB — HEPATIC FUNCTION PANEL
ALT: 199 U/L — ABNORMAL HIGH (ref 0–53)
Alkaline Phosphatase: 120 U/L — ABNORMAL HIGH (ref 39–117)
Bilirubin, Direct: 0.1 mg/dL (ref 0.0–0.3)
Total Bilirubin: 0.8 mg/dL (ref 0.3–1.2)
Total Protein: 5.6 g/dL — ABNORMAL LOW (ref 6.0–8.3)

## 2013-02-27 NOTE — Progress Notes (Signed)
Quick Note:  LFT's are improving Reduce prednisone to 30 mg daily Recheck LFT's in 2 weeks ______

## 2013-02-28 ENCOUNTER — Other Ambulatory Visit: Payer: Self-pay

## 2013-02-28 DIAGNOSIS — K8301 Primary sclerosing cholangitis: Secondary | ICD-10-CM

## 2013-03-15 ENCOUNTER — Encounter: Payer: Self-pay | Admitting: Internal Medicine

## 2013-03-16 ENCOUNTER — Other Ambulatory Visit (INDEPENDENT_AMBULATORY_CARE_PROVIDER_SITE_OTHER): Payer: Medicare Other

## 2013-03-16 DIAGNOSIS — F329 Major depressive disorder, single episode, unspecified: Secondary | ICD-10-CM | POA: Diagnosis not present

## 2013-03-16 DIAGNOSIS — K8309 Other cholangitis: Secondary | ICD-10-CM | POA: Diagnosis not present

## 2013-03-16 DIAGNOSIS — K8301 Primary sclerosing cholangitis: Secondary | ICD-10-CM

## 2013-03-16 DIAGNOSIS — Z23 Encounter for immunization: Secondary | ICD-10-CM | POA: Diagnosis not present

## 2013-03-16 DIAGNOSIS — G479 Sleep disorder, unspecified: Secondary | ICD-10-CM | POA: Diagnosis not present

## 2013-03-16 DIAGNOSIS — F3289 Other specified depressive episodes: Secondary | ICD-10-CM | POA: Diagnosis not present

## 2013-03-16 DIAGNOSIS — J309 Allergic rhinitis, unspecified: Secondary | ICD-10-CM | POA: Diagnosis not present

## 2013-03-16 LAB — HEPATIC FUNCTION PANEL
ALBUMIN: 2.8 g/dL — AB (ref 3.5–5.2)
ALT: 161 U/L — ABNORMAL HIGH (ref 0–53)
AST: 64 U/L — ABNORMAL HIGH (ref 0–37)
Alkaline Phosphatase: 114 U/L (ref 39–117)
BILIRUBIN TOTAL: 0.8 mg/dL (ref 0.3–1.2)
Bilirubin, Direct: 0.1 mg/dL (ref 0.0–0.3)
Total Protein: 5.8 g/dL — ABNORMAL LOW (ref 6.0–8.3)

## 2013-03-19 ENCOUNTER — Encounter: Payer: Self-pay | Admitting: Internal Medicine

## 2013-03-23 ENCOUNTER — Ambulatory Visit (INDEPENDENT_AMBULATORY_CARE_PROVIDER_SITE_OTHER): Payer: Medicare Other | Admitting: Internal Medicine

## 2013-03-23 ENCOUNTER — Encounter: Payer: Self-pay | Admitting: Internal Medicine

## 2013-03-23 VITALS — BP 124/80 | HR 78 | Ht 73.0 in | Wt 196.6 lb

## 2013-03-23 DIAGNOSIS — M949 Disorder of cartilage, unspecified: Secondary | ICD-10-CM

## 2013-03-23 DIAGNOSIS — K5080 Crohn's disease of both small and large intestine without complications: Secondary | ICD-10-CM | POA: Diagnosis not present

## 2013-03-23 DIAGNOSIS — F332 Major depressive disorder, recurrent severe without psychotic features: Secondary | ICD-10-CM | POA: Diagnosis not present

## 2013-03-23 DIAGNOSIS — M899 Disorder of bone, unspecified: Secondary | ICD-10-CM | POA: Diagnosis not present

## 2013-03-23 DIAGNOSIS — K754 Autoimmune hepatitis: Secondary | ICD-10-CM | POA: Diagnosis not present

## 2013-03-23 DIAGNOSIS — M8580 Other specified disorders of bone density and structure, unspecified site: Secondary | ICD-10-CM

## 2013-03-23 MED ORDER — PREDNISONE 20 MG PO TABS: 20.0000 mg | ORAL_TABLET | Freq: Every day | ORAL | Status: DC

## 2013-03-23 NOTE — Assessment & Plan Note (Addendum)
OK on Humira and prednisone, no symptoms. Continue current treatment. Prednisone is on board for autoimmune hepatitis, tapering.

## 2013-03-23 NOTE — Patient Instructions (Addendum)
We have sent the following medications to your pharmacy for you to pick up at your convenience: Prednisone  Please come to our lab in a month for bloodwork to check your liver function test.  No appointment needed.  The lab is open from 7:30am-5:30pm.   Follow up with [place] in 3 months.   I appreciate the opportunity to care for you.

## 2013-03-23 NOTE — Assessment & Plan Note (Signed)
Improved - has stopped counseling Remains in [facility]

## 2013-03-23 NOTE — Progress Notes (Signed)
Subjective:    Patient ID: [name], male    DOB: 1966-01-16, 48 y.o.   MRN: [id number]  HPI Trial he returns today for followup of multiple conditions. His autoimmune hepatitis with PSC overlap has been flaring. His liver chemistries are improving on prednisone. He is on 30 mg daily. He has no diarrhea, it appears that his Clostridium difficile is resolved and his Crohn seems under good control, he is on Humira. He ate quite a bit over the holidays, he has gained weight since starting the prednisone. He has stopped seeing Dr. Cheryln Manly but he remains in [place].  Allergies  Allergen Reactions  . Asacol [Mesalamine]     abd pain  . Azathioprine     abd pain  . Mycophenolate Mofetil     abd pain  . Other     NUTS; constipation  . Wheat Bran     Constipation; flatulence; abd pain   Outpatient Prescriptions Prior to Visit  Medication Sig Dispense Refill  . adalimumab (HUMIRA PEN) 40 MG/0.8ML injection Inject 0.8 mLs (40 mg total) into the skin every 14 (fourteen) days.  2 each  5  . ascorbic acid (VITAMIN C) 1000 MG tablet Take 2,000 mg by mouth daily.       . cholecalciferol (VITAMIN D) 1000 UNITS tablet Take 4,000 Units by mouth 2 (two) times daily.       . fluticasone (FLONASE) 50 MCG/ACT nasal spray Place 2 sprays into the nose daily. Each nostril      . hydrOXYzine (ATARAX/VISTARIL) 25 MG tablet Take 25 mg by mouth at bedtime.       . mirtazapine (REMERON) 30 MG tablet Take 30 mg by mouth at bedtime.      . traMADol (ULTRAM) 50 MG tablet Take 50 mg by mouth every 6 (six) hours as needed for pain.      . Calcium-Magnesium-Vitamin D (SUPER CAL-MAG-D) [phone] MG-MG-UNIT TABS Take 1 tablet by mouth 2 (two) times daily.      . predniSONE (DELTASONE) 20 MG tablet Take 2 tablets (40 mg total) by mouth daily with breakfast.  60 tablet  2   No facility-administered medications prior to visit.   Past Medical History  Diagnosis Date  . Crohn's disease of small and large  intestines   . Hypercholesterolemia   . History of alcohol abuse   . History of substance abuse   . Depression   . 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 Primary sclerosing cholangitis     ? hepatitis overlap - liver bx x 2 and MRCP  . Allergy     SEASONAL  . Anxiety   . Cataract     BILATERAL/REMOVED  . Hypertension   . Personal history of adenomatous colonic polyps 12/2010, 03/2012    12/2010 - 8 mm serrated adenoma of rectum  . Chronic mesenteric ischemia   . Basal cell carcinoma of lower extremity 06/2011    on left leg  . Dysplastic nevus 06/2011    on right back.   . Suicide and self-inflicted poisoning by other sedatives and hypnotics 05/18/2012  . Major depressive disorder, recurrent episode, severe, without mention of psychotic behavior 04/12/2012  . C. difficile diarrhea 10/23/2012  . Autoimmune hepatitis 01/19/2013   Past Surgical History  Procedure Laterality Date  . Cataract extraction, bilateral    . Pylonidal cyst removal    . Foot surgery      right  .  Percutaneous liver biopsy  2007 and 2008  . Colonoscopy  2001, 05/02/2003, 01/28/11    2012: Right colon Crohn's, rectal polyp  . Esophagogastroduodenoscopy  01/28/11    Normal  . Colonoscopy  03/31/2012    Procedure: COLONOSCOPY;  Surgeon: Jerene Bears, MD;  Location: [facility] ENDOSCOPY;  Service: Gastroenterology;  Laterality: N/A;    Review of Systems  as above, mood ok - denies depression    Objective:   Physical Exam General:  NAD, cushingoid Eyes:   anicteric Lungs:  clear Heart:  S1S2 no rubs, murmurs or gallops Abdomen:  soft and nontender, BS+ Ext:   no edema Skin:   Erythematous papular rash on anterior trunk  Data Reviewed:    Chemistry      Component Value Date/Time   NA 137 12/26/2012 1458   K 3.8 12/26/2012 1458   CL 102 12/26/2012 1458   CO2 31 12/26/2012 1458   BUN 12 12/26/2012 1458   CREATININE 1.1 12/26/2012 1458      Component Value Date/Time   CALCIUM 8.6 12/26/2012 1458    ALKPHOS 114 03/16/2013 1300   AST 64* 03/16/2013 1300   ALT 161* 03/16/2013 1300   BILITOT 0.8 03/16/2013 1300        Assessment & Plan:   1. Autoimmune hepatitis-PSC overlap   2. Major depressive disorder, recurrent episode, severe, without mention of psychotic behavior   3. CROHN'S DISEASE, LARGE AND SMALL INTESTINES   4. Osteopenia

## 2013-03-23 NOTE — Assessment & Plan Note (Signed)
He is aware relationship to steroids. He is increased calcium and vitamin D.

## 2013-03-23 NOTE — Assessment & Plan Note (Signed)
Improved Reduce prednisone to 20 mg daily and check LFT's 1 month RTC 3 months He declines immunomodulator Tx - he is aware of long-term steroid effects

## 2013-04-02 ENCOUNTER — Encounter: Payer: Medicare Other | Attending: Internal Medicine | Admitting: *Deleted

## 2013-04-02 ENCOUNTER — Encounter: Payer: Self-pay | Admitting: *Deleted

## 2013-04-02 DIAGNOSIS — F3289 Other specified depressive episodes: Secondary | ICD-10-CM | POA: Insufficient documentation

## 2013-04-02 DIAGNOSIS — K5090 Crohn's disease, unspecified, without complications: Secondary | ICD-10-CM | POA: Diagnosis not present

## 2013-04-02 DIAGNOSIS — E785 Hyperlipidemia, unspecified: Secondary | ICD-10-CM

## 2013-04-02 DIAGNOSIS — F329 Major depressive disorder, single episode, unspecified: Secondary | ICD-10-CM | POA: Diagnosis not present

## 2013-04-02 DIAGNOSIS — I10 Essential (primary) hypertension: Secondary | ICD-10-CM

## 2013-04-02 DIAGNOSIS — Z713 Dietary counseling and surveillance: Secondary | ICD-10-CM | POA: Insufficient documentation

## 2013-04-02 DIAGNOSIS — E43 Unspecified severe protein-calorie malnutrition: Secondary | ICD-10-CM

## 2013-04-02 DIAGNOSIS — M8580 Other specified disorders of bone density and structure, unspecified site: Secondary | ICD-10-CM

## 2013-04-02 DIAGNOSIS — E559 Vitamin D deficiency, unspecified: Secondary | ICD-10-CM

## 2013-04-02 NOTE — Patient Instructions (Signed)
Try water walking Try juicing at home-- start with apple, maybe dilute with special water Frozen yogurt daily Incorporate eggs, and more meat and potatoes  [employer] for counseling  [phone]  [email or web address]

## 2013-04-02 NOTE — Progress Notes (Signed)
  Medical Nutrition Therapy:  Appt start time: 1030 end time:  1130.  Assessment:  Primary concerns today: [name] is here for nutrition counseling.  He states he is concerned about malnutrition.  He is following a specialized diet due to multiple GI issues including Crohn's disease.  He has been struggling with symptom management for 15 years.  He has seen multiple specialists and tried multiple things, but nothing helps manage his GI distress.  He follows a very limited diet, is physically inactive, and fairly depressed about his situation  Preferred Learning Style:   Auditory  Learning Readiness:   Change in progress  MEDICATIONS: see list   DIETARY INTAKE:  Usual eating pattern includes 3 meals and 2 snacks per day.  Everyday foods include green beans, potatoes, apples (raw peeled), meats as long as there aren't any artificial injections, oatmeal bread and honey wheat bread, bran cereal, yogurt: low fat frozen, wheat thins, popcorn, Chinese sauces (duck sauce), olive oil, eggs.    Avoided foods include all others.    24-hr recall:  B ( AM): bran cereal with some skim milk  Snk ( AM): none  L ( PM): smoked [place] sandwich on oatmeal bread with low-salt lays potato chips and hot tea Snk ( PM): apple and wheat thins D ( PM): beef or chicken on the bone or salmon or pork with green beans or potatoes.  Sometimes can eat carrots or peas since currently on prednisone Snk ( PM): not usually Beverages: hot tea, water only out of glass container with special water from [employer]  Usual physical activity: currently walks on treadmill due to prednisone.  Normally can't.  Does sit outside for at least 30 minutes most days to get vitamin D exposure  Estimated energy needs: 2000 calories 225 g carbohydrates 150 g protein 56 g fat   Nutritional Diagnosis:  NI-5.11.1 Predicted suboptimal nutrient intake As related to limited food acceptance due to crohn's disease and other digestive  disorders.  As evidenced by dietary recall and osteopenia.    Intervention:   Try water walking Try juicing at home-- start with apple, maybe dilute with special water Frozen yogurt daily Incorporate eggs, and more meat and potatoes  [employer] for counseling  [place]  Teaching Method Utilized:  Auditory  Barriers to learning/adherence to lifestyle change: GI distress  Demonstrated degree of understanding via:  Teach Back   Monitoring/Evaluation:  Dietary intake, exercise, GI symptoms, pertinent lab data, and body weight prn.

## 2013-05-15 DIAGNOSIS — M25519 Pain in unspecified shoulder: Secondary | ICD-10-CM | POA: Diagnosis not present

## 2013-05-15 DIAGNOSIS — M542 Cervicalgia: Secondary | ICD-10-CM | POA: Diagnosis not present

## 2013-05-25 ENCOUNTER — Other Ambulatory Visit: Payer: Self-pay | Admitting: Dermatology

## 2013-05-25 DIAGNOSIS — C44319 Basal cell carcinoma of skin of other parts of face: Secondary | ICD-10-CM | POA: Diagnosis not present

## 2013-05-25 DIAGNOSIS — D239 Other benign neoplasm of skin, unspecified: Secondary | ICD-10-CM | POA: Diagnosis not present

## 2013-05-25 DIAGNOSIS — L821 Other seborrheic keratosis: Secondary | ICD-10-CM | POA: Diagnosis not present

## 2013-05-25 DIAGNOSIS — L570 Actinic keratosis: Secondary | ICD-10-CM | POA: Diagnosis not present

## 2013-05-25 DIAGNOSIS — Q828 Other specified congenital malformations of skin: Secondary | ICD-10-CM | POA: Diagnosis not present

## 2013-05-25 DIAGNOSIS — Z85828 Personal history of other malignant neoplasm of skin: Secondary | ICD-10-CM | POA: Diagnosis not present

## 2013-06-18 ENCOUNTER — Encounter: Payer: Self-pay | Admitting: Internal Medicine

## 2013-06-19 ENCOUNTER — Other Ambulatory Visit: Payer: Self-pay

## 2013-06-19 ENCOUNTER — Other Ambulatory Visit (INDEPENDENT_AMBULATORY_CARE_PROVIDER_SITE_OTHER): Payer: Medicare Other

## 2013-06-19 DIAGNOSIS — K5080 Crohn's disease of both small and large intestine without complications: Secondary | ICD-10-CM | POA: Diagnosis not present

## 2013-06-19 DIAGNOSIS — K8301 Primary sclerosing cholangitis: Secondary | ICD-10-CM

## 2013-06-19 DIAGNOSIS — K754 Autoimmune hepatitis: Secondary | ICD-10-CM

## 2013-06-19 LAB — HEPATIC FUNCTION PANEL
ALBUMIN: 2.9 g/dL — AB (ref 3.5–5.2)
ALT: 89 U/L — ABNORMAL HIGH (ref 0–53)
AST: 47 U/L — ABNORMAL HIGH (ref 0–37)
Alkaline Phosphatase: 76 U/L (ref 39–117)
Bilirubin, Direct: 0 mg/dL (ref 0.0–0.3)
TOTAL PROTEIN: 6 g/dL (ref 6.0–8.3)
Total Bilirubin: 0.6 mg/dL (ref 0.3–1.2)

## 2013-06-19 NOTE — Progress Notes (Signed)
Quick Note:  Informed by My chart Needs repeat CMEt and CBC early June (before 6/8 REV) entered ______

## 2013-07-06 ENCOUNTER — Encounter: Payer: Self-pay | Admitting: Internal Medicine

## 2013-07-06 DIAGNOSIS — K754 Autoimmune hepatitis: Secondary | ICD-10-CM

## 2013-07-06 MED ORDER — PREDNISONE 20 MG PO TABS: 20.0000 mg | ORAL_TABLET | Freq: Every day | ORAL | Status: DC

## 2013-07-06 NOTE — Telephone Encounter (Signed)
rx sent

## 2013-08-06 ENCOUNTER — Encounter: Payer: Self-pay | Admitting: Internal Medicine

## 2013-08-06 ENCOUNTER — Other Ambulatory Visit (INDEPENDENT_AMBULATORY_CARE_PROVIDER_SITE_OTHER): Payer: Medicare Other

## 2013-08-06 ENCOUNTER — Ambulatory Visit (INDEPENDENT_AMBULATORY_CARE_PROVIDER_SITE_OTHER): Payer: Medicare Other | Admitting: Internal Medicine

## 2013-08-06 VITALS — BP 130/82 | HR 68 | Ht 73.0 in | Wt 215.8 lb

## 2013-08-06 DIAGNOSIS — K5080 Crohn's disease of both small and large intestine without complications: Secondary | ICD-10-CM

## 2013-08-06 DIAGNOSIS — K8309 Other cholangitis: Secondary | ICD-10-CM

## 2013-08-06 DIAGNOSIS — E559 Vitamin D deficiency, unspecified: Secondary | ICD-10-CM | POA: Diagnosis not present

## 2013-08-06 DIAGNOSIS — K754 Autoimmune hepatitis: Secondary | ICD-10-CM | POA: Diagnosis not present

## 2013-08-06 DIAGNOSIS — K8301 Primary sclerosing cholangitis: Secondary | ICD-10-CM

## 2013-08-06 DIAGNOSIS — E43 Unspecified severe protein-calorie malnutrition: Secondary | ICD-10-CM

## 2013-08-06 LAB — COMPREHENSIVE METABOLIC PANEL
ALT: 77 U/L — ABNORMAL HIGH (ref 0–53)
AST: 42 U/L — ABNORMAL HIGH (ref 0–37)
Albumin: 3.1 g/dL — ABNORMAL LOW (ref 3.5–5.2)
Alkaline Phosphatase: 66 U/L (ref 39–117)
BUN: 16 mg/dL (ref 6–23)
CALCIUM: 9 mg/dL (ref 8.4–10.5)
CHLORIDE: 102 meq/L (ref 96–112)
CO2: 29 mEq/L (ref 19–32)
CREATININE: 1.2 mg/dL (ref 0.4–1.5)
GFR: 71.47 mL/min (ref >60.00)
Glucose, Bld: 91 mg/dL (ref 70–99)
POTASSIUM: 3.8 meq/L (ref 3.5–5.1)
Sodium: 138 mEq/L (ref 135–145)
Total Bilirubin: 0.5 mg/dL (ref 0.2–1.2)
Total Protein: 6.3 g/dL (ref 6.0–8.3)

## 2013-08-06 LAB — CBC WITH DIFFERENTIAL/PLATELET
BASOS ABS: 0 10*3/uL (ref 0.0–0.1)
Basophils Relative: 0.4 % (ref 0.0–3.0)
Eosinophils Absolute: 0.1 10*3/uL (ref 0.0–0.7)
Eosinophils Relative: 1.1 % (ref 0.0–5.0)
HCT: 35.3 % — ABNORMAL LOW (ref 39.0–52.0)
Hemoglobin: 11.1 g/dL — ABNORMAL LOW (ref 13.0–17.0)
LYMPHS ABS: 1.6 10*3/uL (ref 0.7–4.0)
LYMPHS PCT: 13.5 % (ref 12.0–46.0)
MCHC: 31.3 g/dL (ref 30.0–36.0)
MCV: 71 fl — ABNORMAL LOW (ref 78.0–100.0)
Monocytes Absolute: 0.7 10*3/uL (ref 0.1–1.0)
Monocytes Relative: 6.1 % (ref 3.0–12.0)
Neutro Abs: 9.2 10*3/uL — ABNORMAL HIGH (ref 1.4–7.7)
Neutrophils Relative %: 78.9 % — ABNORMAL HIGH (ref 43.0–77.0)
Platelets: 255 10*3/uL (ref 150.0–400.0)
RBC: 4.98 Mil/uL (ref 4.22–5.81)
RDW: 20.1 % — ABNORMAL HIGH (ref 11.5–15.5)
WBC: 11.7 10*3/uL — ABNORMAL HIGH (ref 4.0–10.5)

## 2013-08-06 LAB — VITAMIN D 25 HYDROXY (VIT D DEFICIENCY, FRACTURES): VITD: 42.25 ng/mL

## 2013-08-06 NOTE — Assessment & Plan Note (Signed)
Check vit A D and E [place] screening Reduce prednisone to 15 mg qd RTC 3 months Still unable to work given lack of disease stability

## 2013-08-06 NOTE — Assessment & Plan Note (Signed)
Stable to improved Reduce prednisone to 15 mg daily [place] - screen for [facility] RTC 3 months

## 2013-08-06 NOTE — Patient Instructions (Addendum)
Your physician has requested that you go to the basement for the following lab work before leaving today: CBC/diff, CMET, Vit. D, Vit. A, Vit. E  Reduce your Prednisone to 15 mg. Daily.  You have been scheduled for an abdominal ultrasound at [facility] Radiology (1st floor of hospital) on 08/10/13 at 2:00pm. Please arrive 15 minutes prior to your appointment for registration. Make certain not to have anything to eat or drink 6 hours prior to your appointment. Should you need to reschedule your appointment, please contact radiology at [phone]. This test typically takes about 30 minutes to perform.   I appreciate the opportunity to care for you.

## 2013-08-06 NOTE — Assessment & Plan Note (Signed)
improved

## 2013-08-06 NOTE — Progress Notes (Signed)
    Subjective:    Patient ID: [name], male    DOB: 09-10-1965, 48 y.o.   MRN: [id number]  HPI Still has mild RUQ pain. Intermittent diarrhea No bleeding/fever/severe pain  Gaining weight "I can eat potatoes" Started vit A (pure beta-carotene) every 2-3 days because he is not getting into his diet  Medications, allergies, past medical history, past surgical history, family history and social history are reviewed and updated in the EMR.  Review of Systems As above    Objective:   Physical Exam General:  NAD Eyes:   anicteric Lungs:  clear Heart:  S1S2 no rubs, murmurs or gallops Abdomen:  soft and nontender, BS+ Ext:   no edema Data Reviewed:   Previous labs notes    Assessment & Plan:  Primary sclerosing cholangitis ? hepatitis overlap Stable to improved Reduce prednisone to 15 mg daily [place] - screen for [facility] RTC 3 months  Severe protein-calorie malnutrition improved  CROHN'S DISEASE, LARGE AND SMALL INTESTINES Stable/better Reduce prednisone to 15 mg Stay on Humira RTC 3 months Lack of disease stability precludes working  Autoimmune hepatitis-PSC overlap Check vit A D and E [place] screening Reduce prednisone to 15 mg qd RTC 3 months Still unable to work given lack of disease stability   CC:  Melinda Crutch, MD

## 2013-08-06 NOTE — Assessment & Plan Note (Addendum)
Stable/better Reduce prednisone to 15 mg Stay on Humira RTC 3 months Lack of disease stability precludes working

## 2013-08-09 LAB — VITAMIN A: Vitamin A (Retinoic Acid): 49 ug/dL (ref 38–98)

## 2013-08-09 LAB — VITAMIN E
GAMMA-TOCOPHEROL (VIT E): 1.5 mg/L (ref <=4.3)
VITAMIN E (ALPHA TOCOPHEROL): 8.9 mg/L (ref 5.7–19.9)

## 2013-08-10 ENCOUNTER — Ambulatory Visit (HOSPITAL_COMMUNITY)
Admission: RE | Admit: 2013-08-10 | Discharge: 2013-08-10 | Disposition: A | Discharge status: Home / Self Care | Payer: Medicare Other | Source: Ambulatory Visit | Attending: Internal Medicine | Admitting: Internal Medicine

## 2013-08-10 DIAGNOSIS — K8301 Primary sclerosing cholangitis: Secondary | ICD-10-CM

## 2013-08-10 DIAGNOSIS — K7460 Unspecified cirrhosis of liver: Secondary | ICD-10-CM | POA: Insufficient documentation

## 2013-08-10 DIAGNOSIS — K7689 Other specified diseases of liver: Secondary | ICD-10-CM | POA: Diagnosis not present

## 2013-08-10 DIAGNOSIS — E43 Unspecified severe protein-calorie malnutrition: Secondary | ICD-10-CM

## 2013-08-10 DIAGNOSIS — K754 Autoimmune hepatitis: Secondary | ICD-10-CM

## 2013-08-10 DIAGNOSIS — K5080 Crohn's disease of both small and large intestine without complications: Secondary | ICD-10-CM

## 2013-08-13 NOTE — Progress Notes (Signed)
Quick Note:  [place] ok - no masses, + cirrhosis Labs earlier have shown increased microcytosis / anemia Fast soluble vitamin levels ok  Needs a ferritin and B12 level ______

## 2013-08-13 NOTE — Progress Notes (Signed)
Quick Note:  No b12, just ferritin ______

## 2013-08-14 ENCOUNTER — Telehealth: Payer: Self-pay | Admitting: Internal Medicine

## 2013-08-14 ENCOUNTER — Other Ambulatory Visit: Payer: Self-pay

## 2013-08-14 DIAGNOSIS — D649 Anemia, unspecified: Secondary | ICD-10-CM

## 2013-08-14 NOTE — Telephone Encounter (Signed)
Patient notified of results.

## 2013-08-15 ENCOUNTER — Other Ambulatory Visit (INDEPENDENT_AMBULATORY_CARE_PROVIDER_SITE_OTHER): Payer: Medicare Other

## 2013-08-15 DIAGNOSIS — D649 Anemia, unspecified: Secondary | ICD-10-CM | POA: Diagnosis not present

## 2013-08-15 DIAGNOSIS — K8309 Other cholangitis: Secondary | ICD-10-CM

## 2013-08-15 DIAGNOSIS — K8301 Primary sclerosing cholangitis: Secondary | ICD-10-CM

## 2013-08-15 LAB — CBC WITH DIFFERENTIAL/PLATELET
BASOS PCT: 0.3 % (ref 0.0–3.0)
Basophils Absolute: 0 10*3/uL (ref 0.0–0.1)
EOS ABS: 0.1 10*3/uL (ref 0.0–0.7)
Eosinophils Relative: 0.6 % (ref 0.0–5.0)
HCT: 35.5 % — ABNORMAL LOW (ref 39.0–52.0)
Hemoglobin: 11 g/dL — ABNORMAL LOW (ref 13.0–17.0)
LYMPHS PCT: 29.4 % (ref 12.0–46.0)
Lymphs Abs: 3.4 10*3/uL (ref 0.7–4.0)
MCHC: 31.1 g/dL (ref 30.0–36.0)
MCV: 71.6 fl — AB (ref 78.0–100.0)
MONO ABS: 0.9 10*3/uL (ref 0.1–1.0)
Monocytes Relative: 7.7 % (ref 3.0–12.0)
Neutro Abs: 7.2 10*3/uL (ref 1.4–7.7)
Neutrophils Relative %: 62 % (ref 43.0–77.0)
PLATELETS: 253 10*3/uL (ref 150.0–400.0)
RBC: 4.95 Mil/uL (ref 4.22–5.81)
RDW: 20.5 % — ABNORMAL HIGH (ref 11.5–15.5)
WBC: 11.7 10*3/uL — ABNORMAL HIGH (ref 4.0–10.5)

## 2013-08-15 LAB — COMPREHENSIVE METABOLIC PANEL
ALK PHOS: 66 U/L (ref 39–117)
ALT: 68 U/L — AB (ref 0–53)
AST: 37 U/L (ref 0–37)
Albumin: 3.1 g/dL — ABNORMAL LOW (ref 3.5–5.2)
BUN: 18 mg/dL (ref 6–23)
CO2: 23 mEq/L (ref 19–32)
Calcium: 8.8 mg/dL (ref 8.4–10.5)
Chloride: 106 mEq/L (ref 96–112)
Creatinine, Ser: 1.1 mg/dL (ref 0.4–1.5)
GFR: 74.45 mL/min (ref >60.00)
Glucose, Bld: 149 mg/dL — ABNORMAL HIGH (ref 70–99)
Potassium: 3.5 mEq/L (ref 3.5–5.1)
Sodium: 138 mEq/L (ref 135–145)
Total Bilirubin: 0.5 mg/dL (ref 0.2–1.2)
Total Protein: 6.2 g/dL (ref 6.0–8.3)

## 2013-08-15 LAB — FERRITIN: Ferritin: 7.9 ng/mL — ABNORMAL LOW (ref 22.0–322.0)

## 2013-08-16 ENCOUNTER — Encounter: Payer: Self-pay | Admitting: Internal Medicine

## 2013-08-16 NOTE — Progress Notes (Signed)
Quick Note:  Iron is low again Believe we have used infusions in past and think should again Am going to let him know via My chart and go from there ______

## 2013-08-17 NOTE — Progress Notes (Signed)
Quick Note:  Iron dextran with test dose per pharmacy protocol ______

## 2013-08-20 ENCOUNTER — Other Ambulatory Visit: Payer: Self-pay

## 2013-08-20 DIAGNOSIS — D509 Iron deficiency anemia, unspecified: Secondary | ICD-10-CM

## 2013-08-22 ENCOUNTER — Encounter: Payer: Self-pay | Admitting: Internal Medicine

## 2013-08-24 DIAGNOSIS — H43399 Other vitreous opacities, unspecified eye: Secondary | ICD-10-CM | POA: Diagnosis not present

## 2013-08-24 DIAGNOSIS — H1045 Other chronic allergic conjunctivitis: Secondary | ICD-10-CM | POA: Diagnosis not present

## 2013-08-24 DIAGNOSIS — Z961 Presence of intraocular lens: Secondary | ICD-10-CM | POA: Diagnosis not present

## 2013-08-31 ENCOUNTER — Encounter: Payer: Self-pay | Admitting: Internal Medicine

## 2013-08-31 ENCOUNTER — Other Ambulatory Visit: Payer: Self-pay | Admitting: Internal Medicine

## 2013-09-03 ENCOUNTER — Encounter (HOSPITAL_COMMUNITY)
Admission: RE | Admit: 2013-09-03 | Discharge: 2013-09-03 | Disposition: A | Discharge status: Home / Self Care | Payer: Medicare Other | Source: Ambulatory Visit | Attending: Internal Medicine | Admitting: Internal Medicine

## 2013-09-03 ENCOUNTER — Encounter (HOSPITAL_COMMUNITY): Payer: Self-pay

## 2013-09-03 ENCOUNTER — Other Ambulatory Visit (HOSPITAL_COMMUNITY): Payer: Self-pay | Admitting: Internal Medicine

## 2013-09-03 VITALS — BP 143/93 | HR 73 | Temp 99.0°F | Resp 18 | Ht 73.0 in | Wt 215.1 lb

## 2013-09-03 DIAGNOSIS — D509 Iron deficiency anemia, unspecified: Secondary | ICD-10-CM | POA: Diagnosis not present

## 2013-09-03 MED ORDER — SODIUM CHLORIDE 0.9 % IV SOLN
1000.0000 mg | Freq: Once | INTRAVENOUS | Status: AC
  Administered 2013-09-03: 1000 mg via INTRAVENOUS
  Filled 2013-09-03: qty 20

## 2013-09-03 MED ORDER — SODIUM CHLORIDE 0.9 % IV SOLN
25.0000 mg | Freq: Once | INTRAVENOUS | Status: AC
  Administered 2013-09-03: 25 mg via INTRAVENOUS
  Filled 2013-09-03: qty 0.5

## 2013-09-03 MED ORDER — SODIUM CHLORIDE 0.9 % IV SOLN
Freq: Once | INTRAVENOUS | Status: AC
  Administered 2013-09-03: 07:00:00 via INTRAVENOUS

## 2013-09-03 NOTE — Progress Notes (Signed)
Test dose of Infed completed, no problems noted.

## 2013-09-03 NOTE — Telephone Encounter (Signed)
How many refills would you like for me to approve Sir.

## 2013-09-03 NOTE — Telephone Encounter (Signed)
6 months refills

## 2013-09-03 NOTE — Discharge Instructions (Signed)
Anemia, Nonspecific Anemia is a condition in which the concentration of red blood cells or hemoglobin in the blood is below normal. Hemoglobin is a substance in red blood cells that carries oxygen to the tissues of the body. Anemia results in not enough oxygen reaching these tissues.  CAUSES  Common causes of anemia include:   Excessive bleeding. Bleeding may be internal or external. This includes excessive bleeding from periods (in women) or from the intestine.   Poor nutrition.   Chronic kidney, thyroid, and liver disease.  Bone marrow disorders that decrease red blood cell production.  Cancer and treatments for cancer.  HIV, AIDS, and their treatments.  Spleen problems that increase red blood cell destruction.  Blood disorders.  Excess destruction of red blood cells due to infection, medicines, and autoimmune disorders. SIGNS AND SYMPTOMS   Minor weakness.   Dizziness.   Headache.  Palpitations.   Shortness of breath, especially with exercise.   Paleness.  Cold sensitivity.  Indigestion.  Nausea.  Difficulty sleeping.  Difficulty concentrating. Symptoms may occur suddenly or they may develop slowly.  DIAGNOSIS  Additional blood tests are often needed. These help your health care provider determine the best treatment. Your health care provider will check your stool for blood and look for other causes of blood loss.  TREATMENT  Treatment varies depending on the cause of the anemia. Treatment can include:   Supplements of iron, vitamin L79, or folic acid.   Hormone medicines.   A blood transfusion. This may be needed if blood loss is severe.   Hospitalization. This may be needed if there is significant continual blood loss.   Dietary changes.  Spleen removal. HOME CARE INSTRUCTIONS Keep all follow-up appointments. It often takes many weeks to correct anemia, and having your health care provider check on your condition and your response to  treatment is very important. SEEK IMMEDIATE MEDICAL CARE IF:   You develop extreme weakness, shortness of breath, or chest pain.   You become dizzy or have trouble concentrating.  You develop heavy vaginal bleeding.   You develop a rash.   You have bloody or black, tarry stools.   You faint.   You vomit up blood.   You vomit repeatedly.   You have abdominal pain.  You have a fever or persistent symptoms for more than 2-3 days.   You have a fever and your symptoms suddenly get worse.   You are dehydrated.  MAKE SURE YOU:  Understand these instructions.  Will watch your condition.  Will get help right away if you are not doing well or get worse. Document Released: 03/25/2004 Document Revised: 10/18/2012 Document Reviewed: 08/11/2012 [facility] Patient Information 2015 [place], [place]. This information is not intended to replace advice given to you by your health care provider. Make sure you discuss any questions you have with your health care provider. Iron Dextran injection What is this medicine? IRON DEXTRAN (AHY ern DEX tran) is an iron complex. Iron is used to make healthy red blood cells, which carry oxygen and nutrients through the body. This medicine is used to treat people who cannot take iron by mouth and have low levels of iron in the blood. This medicine may be used for other purposes; ask your health care provider or pharmacist if you have questions. COMMON BRAND NAME(S): Dexferrum, INFeD What should I tell my health care provider before I take this medicine? They need to know if you have any of these conditions: -anemia not caused by low  iron levels -heart disease -high levels of iron in the blood -kidney disease -liver disease -an unusual or allergic reaction to iron, other medicines, foods, dyes, or preservatives -pregnant or trying to get pregnant -breast-feeding How should I use this medicine? This medicine is for injection into a vein or a  muscle. It is given by a health care professional in a hospital or clinic setting. Talk to your pediatrician regarding the use of this medicine in children. While this drug may be prescribed for children as young as 51 months old for selected conditions, precautions do apply. Overdosage: If you think you have taken too much of this medicine contact a poison control center or emergency room at once. NOTE: This medicine is only for you. Do not share this medicine with others. What if I miss a dose? It is important not to miss your dose. Call your doctor or health care professional if you are unable to keep an appointment. What may interact with this medicine? Do not take this medicine with any of the following medications: -deferoxamine -dimercaprol -other iron products This medicine may also interact with the following medications: -chloramphenicol -deferasirox This list may not describe all possible interactions. Give your health care provider a list of all the medicines, herbs, non-prescription drugs, or dietary supplements you use. Also tell them if you smoke, drink alcohol, or use illegal drugs. Some items may interact with your medicine. What should I watch for while using this medicine? Visit your doctor or health care professional regularly. Tell your doctor if your symptoms do not start to get better or if they get worse. You may need blood work done while you are taking this medicine. You may need to follow a special diet. Talk to your doctor. Foods that contain iron include: whole grains/cereals, dried fruits, beans, or peas, leafy green vegetables, and organ meats (liver, kidney). Long-term use of this medicine may increase your risk of some cancers. Talk to your doctor about how to limit your risk. What side effects may I notice from receiving this medicine? Side effects that you should report to your doctor or health care professional as soon as possible: -allergic reactions like skin  rash, itching or hives, swelling of the face, lips, or tongue -blue lips, nails, or skin -breathing problems -changes in blood pressure -chest pain -confusion -fast, irregular heartbeat -feeling faint or lightheaded, falls -fever or chills -flushing, sweating, or hot feelings -joint or muscle aches or pains -pain, tingling, numbness in the hands or feet -seizures -unusually weak or tired Side effects that usually do not require medical attention (report to your doctor or health care professional if they continue or are bothersome): -change in taste (metallic taste) -diarrhea -headache -irritation at site where injected -nausea, vomiting -stomach upset This list may not describe all possible side effects. Call your doctor for medical advice about side effects. You may report side effects to FDA at 1-800-FDA-1088. Where should I keep my medicine? This drug is given in a hospital or clinic and will not be stored at home. NOTE: This sheet is a summary. It may not cover all possible information. If you have questions about this medicine, talk to your doctor, pharmacist, or health care provider.  2015, Elsevier/Gold Standard. (2007-07-04 16:59:50)

## 2013-09-03 NOTE — Progress Notes (Signed)
No problems voiced per patient, VSS, Pharmacy notified of no reaction to test dose.

## 2013-10-01 ENCOUNTER — Encounter: Payer: Self-pay | Admitting: Internal Medicine

## 2013-10-02 MED ORDER — PREDNISONE 20 MG PO TABS: 10.0000 mg | ORAL_TABLET | Freq: Every day | ORAL | Status: DC

## 2013-11-12 ENCOUNTER — Other Ambulatory Visit: Payer: Self-pay

## 2013-11-12 ENCOUNTER — Encounter: Payer: Self-pay | Admitting: Internal Medicine

## 2013-11-12 DIAGNOSIS — R197 Diarrhea, unspecified: Secondary | ICD-10-CM

## 2013-11-13 ENCOUNTER — Other Ambulatory Visit: Payer: Medicare Other

## 2013-11-13 DIAGNOSIS — R197 Diarrhea, unspecified: Secondary | ICD-10-CM

## 2013-11-15 ENCOUNTER — Encounter: Payer: Self-pay | Admitting: Internal Medicine

## 2013-11-15 LAB — CLOSTRIDIUM DIFFICILE BY PCR: Toxigenic C. Difficile by PCR: NOT DETECTED

## 2013-11-15 MED ORDER — DILTIAZEM GEL 2 %: CUTANEOUS | Status: DC

## 2013-11-15 NOTE — Telephone Encounter (Signed)
Please Rx diltiazem 2% gel intrarectal bid - sounds like a fissure I also need to see him - ? Next Wed or he could see an APP next week if possible

## 2013-11-18 ENCOUNTER — Encounter: Payer: Self-pay | Admitting: Internal Medicine

## 2013-11-19 ENCOUNTER — Encounter: Payer: Self-pay | Admitting: Internal Medicine

## 2013-11-19 MED ORDER — METRONIDAZOLE 500 MG PO TABS: 250.0000 mg | ORAL_TABLET | Freq: Three times a day (TID) | ORAL | Status: DC

## 2013-11-20 DIAGNOSIS — G479 Sleep disorder, unspecified: Secondary | ICD-10-CM | POA: Diagnosis not present

## 2013-11-20 DIAGNOSIS — F329 Major depressive disorder, single episode, unspecified: Secondary | ICD-10-CM | POA: Diagnosis not present

## 2013-11-20 DIAGNOSIS — F3289 Other specified depressive episodes: Secondary | ICD-10-CM | POA: Diagnosis not present

## 2013-11-20 DIAGNOSIS — Z23 Encounter for immunization: Secondary | ICD-10-CM | POA: Diagnosis not present

## 2013-11-20 DIAGNOSIS — Z0000 Encounter for general adult medical examination without abnormal findings: Secondary | ICD-10-CM | POA: Diagnosis not present

## 2013-11-21 ENCOUNTER — Ambulatory Visit (INDEPENDENT_AMBULATORY_CARE_PROVIDER_SITE_OTHER): Payer: Medicare Other | Admitting: Internal Medicine

## 2013-11-21 ENCOUNTER — Other Ambulatory Visit (INDEPENDENT_AMBULATORY_CARE_PROVIDER_SITE_OTHER): Payer: Medicare Other

## 2013-11-21 ENCOUNTER — Encounter: Payer: Self-pay | Admitting: Internal Medicine

## 2013-11-21 VITALS — BP 112/86 | HR 80 | Ht 73.0 in | Wt 217.0 lb

## 2013-11-21 DIAGNOSIS — K5080 Crohn's disease of both small and large intestine without complications: Secondary | ICD-10-CM

## 2013-11-21 DIAGNOSIS — K8309 Other cholangitis: Secondary | ICD-10-CM

## 2013-11-21 DIAGNOSIS — K754 Autoimmune hepatitis: Secondary | ICD-10-CM

## 2013-11-21 DIAGNOSIS — K602 Anal fissure, unspecified: Secondary | ICD-10-CM

## 2013-11-21 DIAGNOSIS — K8301 Primary sclerosing cholangitis: Secondary | ICD-10-CM

## 2013-11-21 DIAGNOSIS — D500 Iron deficiency anemia secondary to blood loss (chronic): Secondary | ICD-10-CM

## 2013-11-21 DIAGNOSIS — K551 Chronic vascular disorders of intestine: Secondary | ICD-10-CM

## 2013-11-21 DIAGNOSIS — K50819 Crohn's disease of both small and large intestine with unspecified complications: Secondary | ICD-10-CM

## 2013-11-21 LAB — CBC WITH DIFFERENTIAL/PLATELET
BASOS PCT: 0.6 % (ref 0.0–3.0)
Basophils Absolute: 0.1 10*3/uL (ref 0.0–0.1)
EOS PCT: 1.7 % (ref 0.0–5.0)
Eosinophils Absolute: 0.2 10*3/uL (ref 0.0–0.7)
HCT: 44.4 % (ref 39.0–52.0)
Hemoglobin: 14.7 g/dL (ref 13.0–17.0)
Lymphocytes Relative: 21.6 % (ref 12.0–46.0)
Lymphs Abs: 2.1 10*3/uL (ref 0.7–4.0)
MCHC: 33.2 g/dL (ref 30.0–36.0)
MCV: 84.2 fl (ref 78.0–100.0)
MONO ABS: 1.5 10*3/uL — AB (ref 0.1–1.0)
Monocytes Relative: 15.1 % — ABNORMAL HIGH (ref 3.0–12.0)
Neutro Abs: 6.1 10*3/uL (ref 1.4–7.7)
Neutrophils Relative %: 61 % (ref 43.0–77.0)
PLATELETS: 191 10*3/uL (ref 150.0–400.0)
RBC: 5.28 Mil/uL (ref 4.22–5.81)
RDW: 21.8 % — ABNORMAL HIGH (ref 11.5–15.5)
WBC: 9.9 10*3/uL (ref 4.0–10.5)

## 2013-11-21 LAB — COMPREHENSIVE METABOLIC PANEL
ALBUMIN: 3.1 g/dL — AB (ref 3.5–5.2)
ALK PHOS: 89 U/L (ref 39–117)
ALT: 128 U/L — ABNORMAL HIGH (ref 0–53)
AST: 51 U/L — ABNORMAL HIGH (ref 0–37)
BUN: 13 mg/dL (ref 6–23)
CALCIUM: 8.8 mg/dL (ref 8.4–10.5)
CHLORIDE: 104 meq/L (ref 96–112)
CO2: 29 mEq/L (ref 19–32)
Creatinine, Ser: 1.3 mg/dL (ref 0.4–1.5)
GFR: 64.84 mL/min (ref >60.00)
GLUCOSE: 109 mg/dL — AB (ref 70–99)
POTASSIUM: 3.3 meq/L — AB (ref 3.5–5.1)
SODIUM: 137 meq/L (ref 135–145)
TOTAL PROTEIN: 7 g/dL (ref 6.0–8.3)
Total Bilirubin: 0.4 mg/dL (ref 0.2–1.2)

## 2013-11-21 LAB — FERRITIN: FERRITIN: 19 ng/mL — AB (ref 22.0–322.0)

## 2013-11-21 NOTE — Patient Instructions (Addendum)
Your physician has requested that you go to the basement for  lab work before leaving today.

## 2013-11-21 NOTE — Assessment & Plan Note (Addendum)
Flare or infection this time? He is improving with metronidazole though could have been tincture of time Continue metronidazole Stay on Humira RTC 3-4 months

## 2013-11-21 NOTE — Assessment & Plan Note (Addendum)
Labs pred 10/d still. He is taking vit A supplement Level ok in past

## 2013-11-21 NOTE — Progress Notes (Signed)
   Subjective:    Patient ID: [name], male    DOB: May 26, 1965, 48 y.o.   MRN: [id number]  HPI [name] has had diarrhea, rectal bleeding and rectal pain over past 1-2 weeks. C diff PCR was negative. Diltiazem gel Rx has helped rectal pain. Metronidazole started to help diarrhea 2 days ago. He reports improvement in all sxs. Not having fever. He saw PCP and had influenza shot this week. Medications, allergies, past medical history, past surgical history, family history and social history are reviewed and updated in the EMR.  Review of Systems As above and otherwise negative    Objective:   Physical Exam General:  NAD, cushingoid  Abdomen:  soft and nontender, BS+ Rectal:  NL anoderm, some stenosis, posterior tenderness, mild-moderate   Data Reviewed:  Prior notes, labs  Wt Readings from Last 3 Encounters:  11/21/13 217 lb (98.431 kg)  09/03/13 215 lb 2 oz (97.58 kg)  08/06/13 215 lb 12.8 oz (97.886 kg)         Assessment & Plan:  CROHN'S DISEASE, LARGE AND SMALL INTESTINES Flare or infection this time? He is improving with metronidazole though could have been tincture of time Continue metronidazole Stay on Humira RTC 3-4 months   Autoimmune hepatitis-PSC overlap Labs pred 10/d still. He is taking vit A supplement Level ok in past  Primary sclerosing cholangitis ? hepatitis overlap No change - at this time  Chronic mesenteric ischemia Does not seem to be an issue these days   Current outpatient prescriptions:ascorbic acid (VITAMIN C) 1000 MG tablet, Take 2,000 mg by mouth daily. , Disp: , Rfl: ;  Calcium Carb-Cholecalciferol (CALCIUM 1000 + D) 1000-800 MG-UNIT TABS, Take by mouth., Disp: , Rfl: ;  cholecalciferol (VITAMIN D) 1000 UNITS tablet, Take 4,000 Units by mouth 2 (two) times daily. , Disp: , Rfl: ;  diltiazem 2 % GEL, Apply a pea size amount into rectum 2 times a day, Disp: 30 g, Rfl: 0 fluticasone (FLONASE) 50 MCG/ACT nasal spray, Place 2  sprays into the nose daily. Each nostril, Disp: , Rfl: ;  HUMIRA PEN 40 MG/0.8ML PNKT, INJECT 0.8MLS (40MG TOTAL) INTO THE SKIN EVERY 14 DAYS, Disp: 2 each, Rfl: 5;  hydrOXYzine (ATARAX/VISTARIL) 25 MG tablet, Take 25 mg by mouth at bedtime. , Disp: , Rfl: ;  metroNIDAZOLE (FLAGYL) 500 MG tablet, Take 0.5 tablets (250 mg total) by mouth 3 (three) times daily., Disp: 30 tablet, Rfl: 0 mirtazapine (REMERON) 30 MG tablet, Take 30 mg by mouth at bedtime., Disp: , Rfl: ;  predniSONE (DELTASONE) 20 MG tablet, Take 0.5 tablets (10 mg total) by mouth daily with breakfast. Take 15 mg daily., Disp: 100 tablet, Rfl: 1;  vitamin A 25000 UNIT capsule, Take 25,000 Units by mouth daily., Disp: , Rfl:   Cc: Melinda Crutch, MD

## 2013-11-23 ENCOUNTER — Other Ambulatory Visit: Payer: Self-pay | Admitting: Dermatology

## 2013-11-23 DIAGNOSIS — D2330 Other benign neoplasm of skin of unspecified part of face: Secondary | ICD-10-CM | POA: Diagnosis not present

## 2013-11-23 DIAGNOSIS — L821 Other seborrheic keratosis: Secondary | ICD-10-CM | POA: Diagnosis not present

## 2013-11-23 DIAGNOSIS — L570 Actinic keratosis: Secondary | ICD-10-CM | POA: Diagnosis not present

## 2013-11-23 DIAGNOSIS — D2370 Other benign neoplasm of skin of unspecified lower limb, including hip: Secondary | ICD-10-CM | POA: Diagnosis not present

## 2013-11-23 DIAGNOSIS — Z85828 Personal history of other malignant neoplasm of skin: Secondary | ICD-10-CM | POA: Diagnosis not present

## 2013-11-23 DIAGNOSIS — D485 Neoplasm of uncertain behavior of skin: Secondary | ICD-10-CM | POA: Diagnosis not present

## 2013-11-23 DIAGNOSIS — C44611 Basal cell carcinoma of skin of unspecified upper limb, including shoulder: Secondary | ICD-10-CM | POA: Diagnosis not present

## 2013-11-23 DIAGNOSIS — D235 Other benign neoplasm of skin of trunk: Secondary | ICD-10-CM | POA: Diagnosis not present

## 2013-11-23 NOTE — Assessment & Plan Note (Signed)
No change - at this time

## 2013-11-23 NOTE — Assessment & Plan Note (Signed)
Does not seem to be an issue these days

## 2013-11-29 ENCOUNTER — Encounter: Payer: Self-pay | Admitting: Internal Medicine

## 2013-11-29 DIAGNOSIS — L97921 Non-pressure chronic ulcer of unspecified part of left lower leg limited to breakdown of skin: Secondary | ICD-10-CM | POA: Diagnosis not present

## 2013-11-29 DIAGNOSIS — Z09 Encounter for follow-up examination after completed treatment for conditions other than malignant neoplasm: Secondary | ICD-10-CM | POA: Diagnosis not present

## 2013-11-29 DIAGNOSIS — L03116 Cellulitis of left lower limb: Secondary | ICD-10-CM | POA: Diagnosis not present

## 2013-11-29 DIAGNOSIS — Z85828 Personal history of other malignant neoplasm of skin: Secondary | ICD-10-CM | POA: Diagnosis not present

## 2013-11-29 HISTORY — PX: MOHS SURGERY: SUR867

## 2013-11-30 ENCOUNTER — Telehealth: Payer: Self-pay

## 2013-11-30 ENCOUNTER — Ambulatory Visit (AMBULATORY_SURGERY_CENTER): Payer: Self-pay

## 2013-11-30 VITALS — Ht 73.5 in | Wt 218.2 lb

## 2013-11-30 DIAGNOSIS — Z860100 Personal history of colon polyps, unspecified: Secondary | ICD-10-CM

## 2013-11-30 NOTE — Telephone Encounter (Signed)
Message copied by Marlon Pel on Fri Nov 30, 2013  8:58 AM ------      Message from: [name]      Created: Fri Nov 30, 2013  7:03 AM      Regarding: needs colonoscopy for diarrhea and Crohn's       See if he will do a colonoscopy Tuesday ------

## 2013-11-30 NOTE — Progress Notes (Signed)
No allergies to eggs but soy causes "breaking out" No past problems with anesthesia No home oxygen No diet/weight loss meds  Has email  Emmi instructions given for colonoscopy

## 2013-11-30 NOTE — Telephone Encounter (Signed)
Left message for patient to call back  

## 2013-11-30 NOTE — Telephone Encounter (Signed)
Patient notified He will come for a pre-visit today at 2:30 and colon on 12/04/13 11

## 2013-12-02 ENCOUNTER — Encounter: Payer: Self-pay | Admitting: Internal Medicine

## 2013-12-04 ENCOUNTER — Encounter: Payer: Self-pay | Admitting: Internal Medicine

## 2013-12-04 ENCOUNTER — Ambulatory Visit (AMBULATORY_SURGERY_CENTER): Payer: Medicare Other | Admitting: Internal Medicine

## 2013-12-04 ENCOUNTER — Other Ambulatory Visit: Payer: Medicare Other

## 2013-12-04 VITALS — BP 123/85 | HR 72 | Temp 97.1°F | Resp 22 | Ht 73.5 in | Wt 218.0 lb

## 2013-12-04 DIAGNOSIS — K50819 Crohn's disease of both small and large intestine with unspecified complications: Secondary | ICD-10-CM

## 2013-12-04 DIAGNOSIS — K5080 Crohn's disease of both small and large intestine without complications: Secondary | ICD-10-CM | POA: Diagnosis not present

## 2013-12-04 DIAGNOSIS — K529 Noninfective gastroenteritis and colitis, unspecified: Secondary | ICD-10-CM | POA: Diagnosis not present

## 2013-12-04 MED ORDER — SODIUM CHLORIDE 0.9 % IV SOLN: 500.0000 mL | INTRAVENOUS | Status: DC

## 2013-12-04 NOTE — Op Note (Signed)
[place]  [address] [place], [id number]   COLONOSCOPY PROCEDURE REPORT  PATIENT: [name], [name]  MR#: [id number] BIRTHDATE: [birth year] , 48  yrs. old GENDER: male ENDOSCOPIST: [name], MD, [facility] PROCEDURE DATE:  12/04/2013 PROCEDURE:   Colonoscopy with biopsy  ASA CLASS:   Class III INDICATIONS:high risk previously diagnosed Crohn's disease: large intestine. MEDICATIONS: Propofol 200 mg IV and Monitored anesthesia care  DESCRIPTION OF PROCEDURE:   After the risks benefits and alternatives of the procedure were thoroughly explained, informed consent was obtained.  The digital rectal exam revealed no abnormalities of the rectum, revealed no prostatic nodules, and revealed the prostate was not enlarged.   The LB [id number] endoscope was introduced through the anus and advanced to the terminal ileum which was intubated for a short distance. No adverse events experienced.   The quality of the prep was good, using MiraLax  The instrument was then slowly withdrawn as the colon was fully examined.  COLON FINDINGS: Non-bleeding mucosal ulceration, intermittent across the area examined, was present throughout the entire examined colon.  Multiple biopsies were performed using cold forceps. Pseudopolyps were found throughout the entire examined colon.   The examined terminal ileum appeared to be normal.  Multiple random biopsies were performed using cold forceps.  Sample was obtained and sent to histology.  Retroflexed views revealed no abnormalities. The time to cecum=3 minutes 59 seconds.  Withdrawal time=6 minutes 21 seconds.  The scope was withdrawn and the procedure completed. COMPLICATIONS: There were no immediate complications.  ENDOSCOPIC IMPRESSION: 1.   Non-bleeding mucosal ulceration throughout the entire examined colon; multiple biopsies were performed using cold forceps 2.   Pseudopolyps throughout the entire examined colon 3.    The examined terminal ileum appeared to be normal.; multiple random biopsies were performed using cold forceps  RECOMMENDATIONS: Await biopises Adalimumab antibodies/level in lab today  eSigned:  [name], MD, [facility] 12/04/2013 11:19 AM   cc: Melinda Crutch, MD and The Patient

## 2013-12-04 NOTE — Patient Instructions (Addendum)
It looks like the Crohn's is active in the colon. I took biopsies to see - ? ischemia or a viral infection as causes.  I need you to do a blood test today to look for antibodies to the Humira.  Biopsy results should be back by Thursday.  See your dermatologist about the rash.  Hang in there.  I appreciate the opportunity to care for you. [name], MD, FACG  YOU HAD AN ENDOSCOPIC PROCEDURE TODAY AT [facility]: Refer to the procedure report that was given to you for any specific questions about what was found during the examination.  If the procedure report does not answer your questions, please call your gastroenterologist to clarify.  If you requested that your care partner not be given the details of your procedure findings, then the procedure report has been included in a sealed envelope for you to review at your convenience later.  YOU SHOULD EXPECT: Some feelings of bloating in the abdomen. Passage of more gas than usual.  Walking can help get rid of the air that was put into your GI tract during the procedure and reduce the bloating. If you had a lower endoscopy (such as a colonoscopy or flexible sigmoidoscopy) you may notice spotting of blood in your stool or on the toilet paper. If you underwent a bowel prep for your procedure, then you may not have a normal bowel movement for a few days.  DIET: Your first meal following the procedure should be a light meal and then it is ok to progress to your normal diet.  A half-sandwich or bowl of soup is an example of a good first meal.  Heavy or fried foods are harder to digest and may make you feel nauseous or bloated.  Likewise meals heavy in dairy and vegetables can cause extra gas to form and this can also increase the bloating.  Drink plenty of fluids but you should avoid alcoholic beverages for 24 hours.  ACTIVITY: Your care partner should take you home directly after the procedure.  You should plan to take it  easy, moving slowly for the rest of the day.  You can resume normal activity the day after the procedure however you should NOT DRIVE or use heavy machinery for 24 hours (because of the sedation medicines used during the test).    SYMPTOMS TO REPORT IMMEDIATELY: A gastroenterologist can be reached at any hour.  During normal business hours, 8:30 AM to 5:00 PM Monday through Friday, call [phone].  After hours and on weekends, please call the GI answering service at [phone] who will take a message and have the physician on call contact you.   Following lower endoscopy (colonoscopy or flexible sigmoidoscopy):  Excessive amounts of blood in the stool  Significant tenderness or worsening of abdominal pains  Swelling of the abdomen that is new, acute  Fever of 100F or higher  FOLLOW UP: If any biopsies were taken you will be contacted by phone or by letter within the next 1-3 weeks.  Call your gastroenterologist if you have not heard about the biopsies in 3 weeks.  Our staff will call the home number listed on your records the next business day following your procedure to check on you and address any questions or concerns that you may have at that time regarding the information given to you following your procedure. This is a courtesy call and so if there is no answer at the home number and  we have not heard from you through the emergency physician on call, we will assume that you have returned to your regular daily activities without incident.  SIGNATURES/CONFIDENTIALITY: You and/or your care partner have signed paperwork which will be entered into your electronic medical record.  These signatures attest to the fact that that the information above on your After Visit Summary has been reviewed and is understood.  Full responsibility of the confidentiality of this discharge information lies with you and/or your care-partner.

## 2013-12-04 NOTE — Progress Notes (Signed)
Procedure ends, to recovery, report given and VSS. 

## 2013-12-04 NOTE — Progress Notes (Signed)
Called to room to assist during endoscopic procedure.  Patient ID and intended procedure confirmed with present staff. Received instructions for my participation in the procedure from the performing physician.  

## 2013-12-05 ENCOUNTER — Other Ambulatory Visit: Payer: Self-pay

## 2013-12-05 ENCOUNTER — Telehealth: Payer: Self-pay

## 2013-12-05 DIAGNOSIS — D509 Iron deficiency anemia, unspecified: Secondary | ICD-10-CM

## 2013-12-05 NOTE — Telephone Encounter (Signed)
Patient notified via MyChart of Feraheme infusion 12/14/13 3:00 and 12/21/13 3;00 [facility]

## 2013-12-05 NOTE — Telephone Encounter (Signed)
The pt requested we not call him this am.  No call made. maw

## 2013-12-05 NOTE — Telephone Encounter (Signed)
Message copied by Marlon Pel on Wed Dec 05, 2013  2:10 PM ------      Message from: Silvano Rusk E      Created: Wed Dec 05, 2013  6:13 AM      Regarding: feraheme       Please arrange for 2 feraheme injections about a week apart            Dx is iron-deficiency anemia due to chronic blood loss            Thanks ------

## 2013-12-06 ENCOUNTER — Encounter: Payer: Self-pay | Admitting: Internal Medicine

## 2013-12-06 DIAGNOSIS — R21 Rash and other nonspecific skin eruption: Secondary | ICD-10-CM | POA: Diagnosis not present

## 2013-12-06 DIAGNOSIS — N508 Other specified disorders of male genital organs: Secondary | ICD-10-CM | POA: Diagnosis not present

## 2013-12-06 NOTE — Progress Notes (Signed)
Quick Note:  bxs show IBD changes Await adalimumab testing for next step ______

## 2013-12-13 LAB — ADALIMUMAB+AB (SERIAL MONITOR)
Adalimumab Drug Level: 3.8 ug/mL
Anti-Adalimumab Antibody: <25 ng/mL

## 2013-12-14 ENCOUNTER — Encounter (HOSPITAL_COMMUNITY): Payer: Self-pay

## 2013-12-14 ENCOUNTER — Encounter (HOSPITAL_COMMUNITY)
Admission: RE | Admit: 2013-12-14 | Discharge: 2013-12-14 | Disposition: A | Discharge status: Home / Self Care | Payer: Medicare Other | Source: Ambulatory Visit | Attending: Internal Medicine | Admitting: Internal Medicine

## 2013-12-14 ENCOUNTER — Other Ambulatory Visit (HOSPITAL_COMMUNITY): Payer: Self-pay | Admitting: Internal Medicine

## 2013-12-14 VITALS — BP 123/82 | HR 69 | Temp 98.2°F | Resp 16 | Ht 73.0 in | Wt 215.0 lb

## 2013-12-14 DIAGNOSIS — K633 Ulcer of intestine: Secondary | ICD-10-CM | POA: Insufficient documentation

## 2013-12-14 DIAGNOSIS — K635 Polyp of colon: Secondary | ICD-10-CM | POA: Insufficient documentation

## 2013-12-14 DIAGNOSIS — K5010 Crohn's disease of large intestine without complications: Secondary | ICD-10-CM | POA: Diagnosis present

## 2013-12-14 DIAGNOSIS — D509 Iron deficiency anemia, unspecified: Secondary | ICD-10-CM

## 2013-12-14 MED ORDER — FERUMOXYTOL INJECTION 510 MG/17 ML
510.0000 mg | INTRAVENOUS | Status: DC
Start: 2013-12-14 — End: 2013-12-14

## 2013-12-14 MED ORDER — SODIUM CHLORIDE 0.9 % IV SOLN
Freq: Every day | INTRAVENOUS | Status: DC
  Administered 2013-12-14: 15:00:00 via INTRAVENOUS

## 2013-12-14 MED ORDER — SODIUM CHLORIDE 0.9 % IV SOLN
510.0000 mg | INTRAVENOUS | Status: DC
  Administered 2013-12-14: 510 mg via INTRAVENOUS
  Filled 2013-12-14: qty 17

## 2013-12-18 ENCOUNTER — Other Ambulatory Visit: Payer: Self-pay

## 2013-12-18 MED ORDER — MESALAMINE 1.2 G PO TBEC: 2.4000 g | DELAYED_RELEASE_TABLET | Freq: Two times a day (BID) | ORAL | Status: DC

## 2013-12-18 MED ORDER — PREDNISONE 20 MG PO TABS: 30.0000 mg | ORAL_TABLET | Freq: Every day | ORAL | Status: DC

## 2013-12-18 NOTE — Progress Notes (Signed)
Quick Note:  Please let [name] know that the Humira testing seems ok I want him to take Lialda 1.2 g tabs take 2 bid # 1 month supply with 5 RF's and see me in 6-8 weeks  Get a symptom update from him also at this time  Note that there is a mesalamine allergy listed but he had abd pain with Asacol so side effect only ______

## 2013-12-21 ENCOUNTER — Encounter (HOSPITAL_COMMUNITY): Payer: Self-pay

## 2013-12-21 ENCOUNTER — Encounter (HOSPITAL_COMMUNITY)
Admission: RE | Admit: 2013-12-21 | Discharge: 2013-12-21 | Disposition: A | Discharge status: Home / Self Care | Payer: Medicare Other | Source: Ambulatory Visit | Attending: Internal Medicine | Admitting: Internal Medicine

## 2013-12-21 DIAGNOSIS — K633 Ulcer of intestine: Secondary | ICD-10-CM | POA: Diagnosis not present

## 2013-12-21 DIAGNOSIS — K635 Polyp of colon: Secondary | ICD-10-CM | POA: Diagnosis not present

## 2013-12-21 MED ORDER — SODIUM CHLORIDE 0.9 % IV SOLN
Freq: Every day | INTRAVENOUS | Status: AC
  Administered 2013-12-21: 15:00:00 via INTRAVENOUS

## 2013-12-21 MED ORDER — SODIUM CHLORIDE 0.9 % IV SOLN
510.0000 mg | INTRAVENOUS | Status: AC
  Administered 2013-12-21: 510 mg via INTRAVENOUS
  Filled 2013-12-21: qty 17

## 2013-12-21 NOTE — Discharge Instructions (Signed)

## 2014-01-01 ENCOUNTER — Encounter: Payer: Self-pay | Admitting: Internal Medicine

## 2014-02-07 ENCOUNTER — Encounter (HOSPITAL_COMMUNITY): Payer: Self-pay | Admitting: Surgery

## 2014-02-11 ENCOUNTER — Ambulatory Visit (INDEPENDENT_AMBULATORY_CARE_PROVIDER_SITE_OTHER): Payer: Medicare Other | Admitting: Internal Medicine

## 2014-02-11 ENCOUNTER — Encounter: Payer: Self-pay | Admitting: Internal Medicine

## 2014-02-11 ENCOUNTER — Other Ambulatory Visit (INDEPENDENT_AMBULATORY_CARE_PROVIDER_SITE_OTHER): Payer: Medicare Other

## 2014-02-11 VITALS — BP 116/72 | HR 84 | Ht 73.25 in | Wt 214.0 lb

## 2014-02-11 DIAGNOSIS — K830 Cholangitis: Secondary | ICD-10-CM

## 2014-02-11 DIAGNOSIS — Z7952 Long term (current) use of systemic steroids: Secondary | ICD-10-CM

## 2014-02-11 DIAGNOSIS — K8301 Primary sclerosing cholangitis: Secondary | ICD-10-CM

## 2014-02-11 DIAGNOSIS — K50819 Crohn's disease of both small and large intestine with unspecified complications: Secondary | ICD-10-CM

## 2014-02-11 DIAGNOSIS — D500 Iron deficiency anemia secondary to blood loss (chronic): Secondary | ICD-10-CM

## 2014-02-11 LAB — CBC WITH DIFFERENTIAL/PLATELET
Basophils Absolute: 0 10*3/uL (ref 0.0–0.1)
Basophils Relative: 0.2 % (ref 0.0–3.0)
EOS PCT: 0.1 % (ref 0.0–5.0)
Eosinophils Absolute: 0 10*3/uL (ref 0.0–0.7)
HCT: 44.9 % (ref 39.0–52.0)
Hemoglobin: 15 g/dL (ref 13.0–17.0)
LYMPHS PCT: 7.6 % — AB (ref 12.0–46.0)
Lymphs Abs: 0.9 10*3/uL (ref 0.7–4.0)
MCHC: 33.3 g/dL (ref 30.0–36.0)
MCV: 94.4 fl (ref 78.0–100.0)
MONO ABS: 0.3 10*3/uL (ref 0.1–1.0)
Monocytes Relative: 2.8 % — ABNORMAL LOW (ref 3.0–12.0)
Neutro Abs: 10.5 10*3/uL — ABNORMAL HIGH (ref 1.4–7.7)
PLATELETS: 186 10*3/uL (ref 150.0–400.0)
RBC: 4.76 Mil/uL (ref 4.22–5.81)
RDW: 19.1 % — ABNORMAL HIGH (ref 11.5–15.5)
WBC: 11.7 10*3/uL — ABNORMAL HIGH (ref 4.0–10.5)

## 2014-02-11 LAB — COMPREHENSIVE METABOLIC PANEL
ALBUMIN: 3.2 g/dL — AB (ref 3.5–5.2)
ALT: 296 U/L — ABNORMAL HIGH (ref 0–53)
AST: 111 U/L — ABNORMAL HIGH (ref 0–37)
Alkaline Phosphatase: 136 U/L — ABNORMAL HIGH (ref 39–117)
BUN: 30 mg/dL — ABNORMAL HIGH (ref 6–23)
CALCIUM: 9 mg/dL (ref 8.4–10.5)
CHLORIDE: 103 meq/L (ref 96–112)
CO2: 26 meq/L (ref 19–32)
Creatinine, Ser: 1.2 mg/dL (ref 0.4–1.5)
GFR: 69.24 mL/min (ref >60.00)
Glucose, Bld: 125 mg/dL — ABNORMAL HIGH (ref 70–99)
Potassium: 4.3 mEq/L (ref 3.5–5.1)
SODIUM: 136 meq/L (ref 135–145)
TOTAL PROTEIN: 6.5 g/dL (ref 6.0–8.3)
Total Bilirubin: 0.6 mg/dL (ref 0.2–1.2)

## 2014-02-11 LAB — FERRITIN: FERRITIN: 64.8 ng/mL (ref 22.0–322.0)

## 2014-02-11 NOTE — Assessment & Plan Note (Signed)
Recheck labs 

## 2014-02-11 NOTE — Assessment & Plan Note (Signed)
Seems ok at present though still has episodic diarrhea so not sure in remission I advised restarting Humira but he says he will consider later "when I get to 15 mg of prednisone"  Will try to taper prednisone to 15 mg daily over next 2 months and see me then Labs today

## 2014-02-11 NOTE — Patient Instructions (Signed)
   Please go to the lab for CBC, CMET and ferritin today.  I need to see you again in 2 months.   Taper prednisone by going down 5 mg a day every 2 weeks until you get to 15 mg daily.  I appreciate the opportunity to care for you. [name], MD, [facility]

## 2014-02-11 NOTE — Progress Notes (Signed)
   Subjective:    Patient ID: [name], male    DOB: 15-Apr-1965, 48 y.o.   MRN: [id number]  HPI [name] is here for follow-up. He was last seen to the colonoscopy in October. He had patchy colitis throughout. Sometime around that. He stopped his Humira. He has remained on prednisone and generally feels okay though he does have intermittent diarrhea related to what he eats at times he says. He restarted vitamin a he says it helps his skin. He had a flu shot this year. He denies abdominal pain. Sometimes he belches and burps a lot after he eats. In general he says is done pretty well. He will consider resuming Humira when he gets to 15 mg of prednisone. He doesn't really elaborate further as to why he is willing to try it then. Medications, allergies, past medical history, past surgical history, family history and social history are reviewed and updated in the EMR.  Review of Systems As above    Objective:   Physical Exam General:  NAD Eyes:   anicteric Lungs:  clear Heart:  S1S2 no rubs, murmurs or gallops Abdomen:  soft and nontender, BS+ Ext:   no edema Psych:  Appears to have an appropriate mood and affect      Assessment & Plan:  CROHN'S DISEASE, LARGE AND SMALL INTESTINES Seems ok at present though still has episodic diarrhea so not sure in remission I advised restarting Humira but he says he will consider later "when I get to 15 mg of prednisone"  Will try to taper prednisone to 15 mg daily over next 2 months and see me then Labs today  Primary sclerosing cholangitis ? hepatitis overlap Recheck labs today  Long term current use of systemic steroids He has been and is aware of risks and chooses to take these  Anemia due to chronic blood loss Recheck labs   CC:  Melinda Crutch, MD

## 2014-02-11 NOTE — Assessment & Plan Note (Signed)
He has been and is aware of risks and chooses to take these

## 2014-02-11 NOTE — Assessment & Plan Note (Signed)
Recheck labs today. 

## 2014-02-14 ENCOUNTER — Other Ambulatory Visit: Payer: Self-pay

## 2014-02-14 DIAGNOSIS — R7989 Other specified abnormal findings of blood chemistry: Secondary | ICD-10-CM

## 2014-02-14 DIAGNOSIS — R945 Abnormal results of liver function studies: Principal | ICD-10-CM

## 2014-02-14 NOTE — Progress Notes (Signed)
Quick Note:  Notified by My Chart and also told him to have LFT Jan 4 - need to place order ______

## 2014-02-26 ENCOUNTER — Other Ambulatory Visit: Payer: Self-pay | Admitting: Dermatology

## 2014-02-26 DIAGNOSIS — D485 Neoplasm of uncertain behavior of skin: Secondary | ICD-10-CM | POA: Diagnosis not present

## 2014-02-26 DIAGNOSIS — Z85828 Personal history of other malignant neoplasm of skin: Secondary | ICD-10-CM | POA: Diagnosis not present

## 2014-02-26 DIAGNOSIS — D2272 Melanocytic nevi of left lower limb, including hip: Secondary | ICD-10-CM | POA: Diagnosis not present

## 2014-03-04 ENCOUNTER — Other Ambulatory Visit: Payer: Self-pay | Admitting: Internal Medicine

## 2014-03-05 ENCOUNTER — Other Ambulatory Visit: Payer: Self-pay | Admitting: Internal Medicine

## 2014-03-05 DIAGNOSIS — K602 Anal fissure, unspecified: Secondary | ICD-10-CM

## 2014-03-05 MED ORDER — DILTIAZEM GEL 2 %: CUTANEOUS | Status: DC

## 2014-03-05 MED ORDER — HYDROCORTISONE 2.5 % RE CREA: 1.0000 "application " | TOPICAL_CREAM | Freq: Two times a day (BID) | RECTAL | Status: DC | PRN

## 2014-03-05 MED ORDER — LORAZEPAM 1 MG PO TABS: 1.0000 mg | ORAL_TABLET | Freq: Three times a day (TID) | ORAL | Status: DC | PRN

## 2014-03-05 NOTE — Telephone Encounter (Signed)
Rx's faxed to [facility] for diltiazem gel and lorazepam.  Dr Carlean Purl e-scribed the hydrocortisone rectal cream.

## 2014-03-05 NOTE — Telephone Encounter (Signed)
-----   Message from [name], MD sent at 03/05/2014 11:54 AM EST ----- Regarding: rx Please send RX's to [facility] for:  Refill x 2 on diltiazem gel  refill lorazepam 1 mg tid prn anxiety # 90 no refill

## 2014-03-08 ENCOUNTER — Other Ambulatory Visit (INDEPENDENT_AMBULATORY_CARE_PROVIDER_SITE_OTHER): Payer: Medicare Other

## 2014-03-08 DIAGNOSIS — R7989 Other specified abnormal findings of blood chemistry: Secondary | ICD-10-CM

## 2014-03-08 DIAGNOSIS — R945 Abnormal results of liver function studies: Principal | ICD-10-CM

## 2014-03-08 LAB — HEPATIC FUNCTION PANEL
ALT: 164 U/L — AB (ref 0–53)
AST: 72 U/L — ABNORMAL HIGH (ref 0–37)
Albumin: 3.3 g/dL — ABNORMAL LOW (ref 3.5–5.2)
Alkaline Phosphatase: 97 U/L (ref 39–117)
Bilirubin, Direct: 0.1 mg/dL (ref 0.0–0.3)
Total Bilirubin: 0.6 mg/dL (ref 0.2–1.2)
Total Protein: 6.4 g/dL (ref 6.0–8.3)

## 2014-03-11 NOTE — Progress Notes (Signed)
Quick Note:  LFT's a little better Recheck before next appt please ______

## 2014-03-18 ENCOUNTER — Encounter: Payer: Self-pay | Admitting: Internal Medicine

## 2014-04-08 ENCOUNTER — Encounter: Payer: Self-pay | Admitting: Internal Medicine

## 2014-04-08 ENCOUNTER — Other Ambulatory Visit: Payer: Self-pay | Admitting: Internal Medicine

## 2014-04-08 NOTE — Telephone Encounter (Signed)
OK to refill

## 2014-04-08 NOTE — Telephone Encounter (Signed)
Please advise Sir, thank you. 

## 2014-04-11 ENCOUNTER — Ambulatory Visit (INDEPENDENT_AMBULATORY_CARE_PROVIDER_SITE_OTHER): Payer: Medicare Other | Admitting: Internal Medicine

## 2014-04-11 ENCOUNTER — Encounter: Payer: Self-pay | Admitting: Internal Medicine

## 2014-04-11 ENCOUNTER — Other Ambulatory Visit (INDEPENDENT_AMBULATORY_CARE_PROVIDER_SITE_OTHER): Payer: Medicare Other

## 2014-04-11 VITALS — BP 122/88 | HR 80 | Ht 73.25 in | Wt 223.2 lb

## 2014-04-11 DIAGNOSIS — D500 Iron deficiency anemia secondary to blood loss (chronic): Secondary | ICD-10-CM

## 2014-04-11 DIAGNOSIS — E785 Hyperlipidemia, unspecified: Secondary | ICD-10-CM | POA: Diagnosis not present

## 2014-04-11 DIAGNOSIS — K50819 Crohn's disease of both small and large intestine with unspecified complications: Secondary | ICD-10-CM

## 2014-04-11 DIAGNOSIS — R945 Abnormal results of liver function studies: Principal | ICD-10-CM

## 2014-04-11 DIAGNOSIS — K754 Autoimmune hepatitis: Secondary | ICD-10-CM | POA: Diagnosis not present

## 2014-04-11 DIAGNOSIS — K830 Cholangitis: Secondary | ICD-10-CM | POA: Diagnosis not present

## 2014-04-11 DIAGNOSIS — R7989 Other specified abnormal findings of blood chemistry: Secondary | ICD-10-CM

## 2014-04-11 DIAGNOSIS — R21 Rash and other nonspecific skin eruption: Secondary | ICD-10-CM

## 2014-04-11 DIAGNOSIS — K8301 Primary sclerosing cholangitis: Secondary | ICD-10-CM

## 2014-04-11 LAB — CBC WITH DIFFERENTIAL/PLATELET
Basophils Absolute: 0 10*3/uL (ref 0.0–0.1)
Basophils Relative: 0.3 % (ref 0.0–3.0)
EOS ABS: 0.2 10*3/uL (ref 0.0–0.7)
Eosinophils Relative: 1.2 % (ref 0.0–5.0)
HCT: 46.1 % (ref 39.0–52.0)
Hemoglobin: 16 g/dL (ref 13.0–17.0)
Lymphocytes Relative: 12 % (ref 12.0–46.0)
Lymphs Abs: 1.5 10*3/uL (ref 0.7–4.0)
MCHC: 34.8 g/dL (ref 30.0–36.0)
MCV: 90.7 fl (ref 78.0–100.0)
MONO ABS: 1.1 10*3/uL — AB (ref 0.1–1.0)
Monocytes Relative: 8.7 % (ref 3.0–12.0)
NEUTROS ABS: 9.6 10*3/uL — AB (ref 1.4–7.7)
NEUTROS PCT: 77.8 % — AB (ref 43.0–77.0)
Platelets: 197 10*3/uL (ref 150.0–400.0)
RBC: 5.08 Mil/uL (ref 4.22–5.81)
RDW: 13.9 % (ref 11.5–15.5)
WBC: 12.3 10*3/uL — ABNORMAL HIGH (ref 4.0–10.5)

## 2014-04-11 LAB — HEPATIC FUNCTION PANEL
ALT: 360 U/L — ABNORMAL HIGH (ref 0–53)
AST: 173 U/L — AB (ref 0–37)
Albumin: 3.4 g/dL — ABNORMAL LOW (ref 3.5–5.2)
Alkaline Phosphatase: 186 U/L — ABNORMAL HIGH (ref 39–117)
Bilirubin, Direct: 0.1 mg/dL (ref 0.0–0.3)
Total Bilirubin: 0.5 mg/dL (ref 0.2–1.2)
Total Protein: 6.5 g/dL (ref 6.0–8.3)

## 2014-04-11 LAB — COMPREHENSIVE METABOLIC PANEL
ALT: 360 U/L — ABNORMAL HIGH (ref 0–53)
AST: 173 U/L — ABNORMAL HIGH (ref 0–37)
Albumin: 3.4 g/dL — ABNORMAL LOW (ref 3.5–5.2)
Alkaline Phosphatase: 186 U/L — ABNORMAL HIGH (ref 39–117)
BILIRUBIN TOTAL: 0.5 mg/dL (ref 0.2–1.2)
BUN: 24 mg/dL — AB (ref 6–23)
CALCIUM: 9.1 mg/dL (ref 8.4–10.5)
CHLORIDE: 102 meq/L (ref 96–112)
CO2: 30 mEq/L (ref 19–32)
Creatinine, Ser: 1.28 mg/dL (ref 0.40–1.50)
GFR: 62.99 mL/min (ref >60.00)
Glucose, Bld: 98 mg/dL (ref 70–99)
Potassium: 4 mEq/L (ref 3.5–5.1)
Sodium: 138 mEq/L (ref 135–145)
Total Protein: 6.5 g/dL (ref 6.0–8.3)

## 2014-04-11 NOTE — Progress Notes (Signed)
Subjective:    Patient ID: [name], male    DOB: 03-03-1965, 49 y.o.   MRN: [id number] Chief complaint is diarrhea, follow-up Crohn's disease and liver disease HPI [name] is here today, last seen in December 2015. He reports some worsening diarrhea, he stays on 20 mg prednisone at this point. He is ready to restart Humira. He's had a skin rash on his trunk again this is been a chronic recurrent issue, he sees dermatology. Denies fevers. No rectal bleeding. He is increased. Allergies  Allergen Reactions  . Asacol [Mesalamine]     abd pain  . Azathioprine     abd pain  . Mycophenolate Mofetil     abd pain  . Other     NUTS; constipation  . Wheat Bran     Constipation; flatulence; abd pain   Outpatient Prescriptions Prior to Visit  Medication Sig Dispense Refill  . ascorbic acid (VITAMIN C) 1000 MG tablet Take 2,000 mg by mouth daily.     . Calcium Carb-Cholecalciferol (CALCIUM 1000 + D) 1000-800 MG-UNIT TABS Take by mouth. Takes 2 (2070m) tabs bid    . cholecalciferol (VITAMIN D) 1000 UNITS tablet Take 4,000 Units by mouth 2 (two) times daily.     .Marland Kitchendiltiazem 2 % GEL Apply a pea size amount into rectum 2 times a day 30 g 1  . fluticasone (FLONASE) 50 MCG/ACT nasal spray Place 2 sprays into the nose daily. Each nostril    . hydrocortisone (ANUSOL-HC) 2.5 % rectal cream Place 1 application rectally 2 (two) times daily as needed for hemorrhoids or itching. 30 g 1  . hydrOXYzine (ATARAX/VISTARIL) 25 MG tablet Take 25 mg by mouth at bedtime.     .Marland KitchenLORazepam (ATIVAN) 1 MG tablet Take 1 tablet (1 mg total) by mouth 3 (three) times daily as needed for anxiety. 90 tablet 0  . mirtazapine (REMERON) 30 MG tablet Take 30 mg by mouth at bedtime.    . mupirocin ointment (BACTROBAN) 2 %     . predniSONE (DELTASONE) 20 MG tablet TAKE 1&1/2 TABLETS BY MOUTH EVERY DAY WITH LUNCH 45 tablet 2  . vitamin A 25000 UNIT capsule Take 25,000 Units by mouth daily.    . mesalamine (LIALDA) 1.2 G  EC tablet Take 2 tablets (2.4 g total) by mouth 2 (two) times daily. 120 tablet 5   No facility-administered medications prior to visit.   Past Medical History  Diagnosis Date  . Crohn's disease of small and large intestines   . Hypercholesterolemia   . History of alcohol abuse   . History of substance abuse   . Depression   . 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 Primary sclerosing cholangitis     ? hepatitis overlap - liver bx x 2 and MRCP  . Allergy     SEASONAL  . Anxiety   . Cataract     BILATERAL/REMOVED  . Hypertension   . Personal history of adenomatous colonic polyps 12/2010, 03/2012    12/2010 - 8 mm serrated adenoma of rectum  . Chronic mesenteric ischemia   . Basal cell carcinoma of lower extremity 06/2011    on left leg  . Dysplastic nevus 06/2011    on right back.   . Suicide and self-inflicted poisoning by other sedatives and hypnotics 05/18/2012  . Major depressive disorder, recurrent episode, severe, without mention of psychotic behavior 04/12/2012  . C. difficile diarrhea 10/23/2012  . Autoimmune hepatitis 01/19/2013  .  Osteopenia   . Anemia    Past Surgical History  Procedure Laterality Date  . Cataract extraction, bilateral    . Pylonidal cyst removal    . Foot surgery      right  . Percutaneous liver biopsy  2007 and 2008  . Colonoscopy  2001, 05/02/2003, 01/28/11    2012: Right colon Crohn's, rectal polyp  . Esophagogastroduodenoscopy  01/28/11    Normal  . Colonoscopy  03/31/2012    Procedure: COLONOSCOPY;  Surgeon: Jerene Bears, MD;  Location: [facility] ENDOSCOPY;  Service: Gastroenterology;  Laterality: N/A;  . Mohs surgery Left 11/2013    left ankle parakerotosis removal by dermatologist  . Abdominal aortagram N/A 03/29/2012    Procedure: ABDOMINAL Maxcine Ham;  Surgeon: Serafina Mitchell, MD;  Location: [facility] CATH LAB;  Service: Cardiovascular;  Laterality: N/A;   Review of Systems As per history of present illness    Objective:   Physical Exam BP  122/88 mmHg  Pulse 80  Ht 6' 1.25" (1.861 m)  Wt 223 lb 4 oz (101.266 kg)  BMI 29.24 kg/m2 Well-developed and well-nourished, somewhat cushingoid Eyes are anicteric Lungs are clear Heart sounds show S1-S2 no murmur Abdomen is soft and nontender without organomegaly or mass bowel sounds are present Skin shows abdominal and chest anterior rash maculopapular with slight pustules and some excoriations. He had shaved his chest tear as well it is growing back. He appears alert and oriented 3. Affect is slightly manic in my opinion.     Assessment & Plan:  CROHN'S DISEASE, LARGE AND SMALL INTESTINES Restart Humira, this will probably take a prior authorization. Will recheck a QuantiFERON Gold TB test. Continue prednisone 20 mg daily. He is to give me a status update and about a month to try to taper this down some. Continue Lialda Labs today  RTC 3 months   Rash on trunk Recurrent Seeing dermatology   Anemia due to chronic blood loss CBC   Autoimmune hepatitis-PSC overlap Continue prednisone recheck LFTs today.    CC:  Melinda Crutch, MD

## 2014-04-11 NOTE — Patient Instructions (Signed)
Your physician has requested that you go to the basement for the following lab work before leaving today: CBC/diff, CMET, TB blood test  Follow up with [place] in 3 months, and please MyChart [place] an update in a month.  Barb Merino, RN, CGRN will have to reorder your Humira.   I appreciate the opportunity to care for you. Silvano Rusk, M.D., [facility]

## 2014-04-11 NOTE — Assessment & Plan Note (Signed)
CBC

## 2014-04-11 NOTE — Assessment & Plan Note (Addendum)
Restart Humira, this will probably take a prior authorization. Will recheck a QuantiFERON Gold TB test. Continue prednisone 20 mg daily. He is to give me a status update and about a month to try to taper this down some. Continue Lialda Labs today  RTC 3 months

## 2014-04-11 NOTE — Assessment & Plan Note (Signed)
Recurrent Seeing dermatology

## 2014-04-11 NOTE — Progress Notes (Signed)
Quick Note:  Needs repeat LFT 1 month Needs to restart Humira Please rx and notify ______

## 2014-04-11 NOTE — Assessment & Plan Note (Deleted)
Recheck LFT's Continue prednisone

## 2014-04-11 NOTE — Assessment & Plan Note (Signed)
Continue prednisone recheck LFTs today.

## 2014-04-12 ENCOUNTER — Other Ambulatory Visit: Payer: Self-pay

## 2014-04-12 DIAGNOSIS — R7989 Other specified abnormal findings of blood chemistry: Secondary | ICD-10-CM

## 2014-04-12 DIAGNOSIS — R945 Abnormal results of liver function studies: Principal | ICD-10-CM

## 2014-04-12 MED ORDER — ADALIMUMAB 40 MG/0.8ML ~~LOC~~ AJKT: 40.0000 mg | AUTO-INJECTOR | SUBCUTANEOUS | Status: DC

## 2014-04-12 MED ORDER — ADALIMUMAB 40 MG/0.8ML ~~LOC~~ AJKT: 160.0000 mg | AUTO-INJECTOR | Freq: Once | SUBCUTANEOUS | Status: DC

## 2014-04-12 NOTE — Addendum Note (Signed)
Addended by: Marlon Pel on: 04/12/2014 02:55 PM   Modules accepted: Orders

## 2014-04-16 LAB — QUANTIFERON TB GOLD ASSAY (BLOOD)
Mitogen value: 0.03 IU/mL
Quantiferon Nil Value: 0.02 IU/mL
Quantiferon Tb Ag Minus Nil Value: 0.01 IU/mL
TB AG VALUE: 0.03 [IU]/mL

## 2014-04-16 NOTE — Progress Notes (Signed)
Quick Note:  Indeterminate Can do a PPD  ______

## 2014-04-17 ENCOUNTER — Ambulatory Visit (INDEPENDENT_AMBULATORY_CARE_PROVIDER_SITE_OTHER): Payer: Medicare Other | Admitting: Internal Medicine

## 2014-04-17 DIAGNOSIS — K50919 Crohn's disease, unspecified, with unspecified complications: Secondary | ICD-10-CM | POA: Diagnosis not present

## 2014-04-17 MED ORDER — TUBERCULIN PPD 5 UNIT/0.1ML ID SOLN
5.0000 [IU] | Freq: Once | INTRADERMAL | Status: AC
  Administered 2014-04-17: 5 [IU] via INTRADERMAL

## 2014-04-24 ENCOUNTER — Encounter: Payer: Self-pay | Admitting: Internal Medicine

## 2014-04-24 DIAGNOSIS — Z85828 Personal history of other malignant neoplasm of skin: Secondary | ICD-10-CM | POA: Diagnosis not present

## 2014-04-24 DIAGNOSIS — L309 Dermatitis, unspecified: Secondary | ICD-10-CM | POA: Diagnosis not present

## 2014-04-24 DIAGNOSIS — L111 Transient acantholytic dermatosis [Grover]: Secondary | ICD-10-CM | POA: Diagnosis not present

## 2014-04-26 ENCOUNTER — Encounter: Payer: Self-pay | Admitting: Internal Medicine

## 2014-07-01 ENCOUNTER — Encounter: Payer: Self-pay | Admitting: Internal Medicine

## 2014-07-01 DIAGNOSIS — R197 Diarrhea, unspecified: Secondary | ICD-10-CM

## 2014-07-01 NOTE — Telephone Encounter (Signed)
Called patient He tookl 10 days of septra DS for the URI  1) OK to use prednisone 40 mg daily 2) will check C diff PCR

## 2014-07-02 ENCOUNTER — Other Ambulatory Visit: Payer: Medicare Other

## 2014-07-02 DIAGNOSIS — R197 Diarrhea, unspecified: Secondary | ICD-10-CM

## 2014-07-03 LAB — CLOSTRIDIUM DIFFICILE BY PCR: Toxigenic C. Difficile by PCR: NOT DETECTED

## 2014-07-04 ENCOUNTER — Encounter: Payer: Self-pay | Admitting: Internal Medicine

## 2014-07-04 ENCOUNTER — Other Ambulatory Visit: Payer: Self-pay | Admitting: Internal Medicine

## 2014-07-04 DIAGNOSIS — K50811 Crohn's disease of both small and large intestine with rectal bleeding: Secondary | ICD-10-CM

## 2014-07-04 MED ORDER — PREDNISONE 20 MG PO TABS: 40.0000 mg | ORAL_TABLET | Freq: Every day | ORAL | Status: DC

## 2014-07-04 NOTE — Progress Notes (Signed)
Quick Note:  Neg - see My Chart ______

## 2014-07-09 ENCOUNTER — Telehealth: Payer: Self-pay | Admitting: Internal Medicine

## 2014-07-09 NOTE — Telephone Encounter (Signed)
I left the patient a message that he has been scheduled for 08/12/14 10:15.  He is asked to call back with any additional questions or concerns.

## 2014-07-24 ENCOUNTER — Encounter: Payer: Self-pay | Admitting: Internal Medicine

## 2014-07-24 DIAGNOSIS — K50811 Crohn's disease of both small and large intestine with rectal bleeding: Secondary | ICD-10-CM

## 2014-07-24 MED ORDER — PREDNISONE 20 MG PO TABS: 40.0000 mg | ORAL_TABLET | Freq: Every day | ORAL | Status: DC

## 2014-08-12 ENCOUNTER — Encounter: Payer: Self-pay | Admitting: Internal Medicine

## 2014-08-12 ENCOUNTER — Other Ambulatory Visit (INDEPENDENT_AMBULATORY_CARE_PROVIDER_SITE_OTHER): Payer: Medicare Other

## 2014-08-12 ENCOUNTER — Ambulatory Visit (INDEPENDENT_AMBULATORY_CARE_PROVIDER_SITE_OTHER): Payer: Medicare Other | Admitting: Internal Medicine

## 2014-08-12 VITALS — BP 132/84 | HR 88 | Ht 73.25 in | Wt 216.2 lb

## 2014-08-12 DIAGNOSIS — F332 Major depressive disorder, recurrent severe without psychotic features: Secondary | ICD-10-CM

## 2014-08-12 DIAGNOSIS — R7989 Other specified abnormal findings of blood chemistry: Secondary | ICD-10-CM | POA: Diagnosis not present

## 2014-08-12 DIAGNOSIS — K50811 Crohn's disease of both small and large intestine with rectal bleeding: Secondary | ICD-10-CM

## 2014-08-12 DIAGNOSIS — K754 Autoimmune hepatitis: Secondary | ICD-10-CM | POA: Diagnosis not present

## 2014-08-12 DIAGNOSIS — R945 Abnormal results of liver function studies: Secondary | ICD-10-CM

## 2014-08-12 LAB — CBC WITH DIFFERENTIAL/PLATELET
Basophils Absolute: 0 10*3/uL (ref 0.0–0.1)
Basophils Relative: 0.2 % (ref 0.0–3.0)
EOS PCT: 0.5 % (ref 0.0–5.0)
Eosinophils Absolute: 0.1 10*3/uL (ref 0.0–0.7)
HEMATOCRIT: 46 % (ref 39.0–52.0)
HEMOGLOBIN: 15.2 g/dL (ref 13.0–17.0)
LYMPHS PCT: 24.5 % (ref 12.0–46.0)
Lymphs Abs: 4.4 10*3/uL — ABNORMAL HIGH (ref 0.7–4.0)
MCHC: 33.1 g/dL (ref 30.0–36.0)
MCV: 91.1 fl (ref 78.0–100.0)
Monocytes Absolute: 2.4 10*3/uL — ABNORMAL HIGH (ref 0.1–1.0)
Monocytes Relative: 13.3 % — ABNORMAL HIGH (ref 3.0–12.0)
Neutro Abs: 11.2 10*3/uL — ABNORMAL HIGH (ref 1.4–7.7)
Neutrophils Relative %: 61.5 % (ref 43.0–77.0)
Platelets: 293 10*3/uL (ref 150.0–400.0)
RBC: 5.05 Mil/uL (ref 4.22–5.81)
RDW: 16.1 % — ABNORMAL HIGH (ref 11.5–15.5)

## 2014-08-12 LAB — HEPATIC FUNCTION PANEL
ALBUMIN: 3.2 g/dL — AB (ref 3.5–5.2)
ALK PHOS: 151 U/L — AB (ref 39–117)
ALT: 416 U/L — ABNORMAL HIGH (ref 0–53)
AST: 101 U/L — ABNORMAL HIGH (ref 0–37)
Bilirubin, Direct: 0.1 mg/dL (ref 0.0–0.3)
TOTAL PROTEIN: 6.6 g/dL (ref 6.0–8.3)
Total Bilirubin: 0.5 mg/dL (ref 0.2–1.2)

## 2014-08-12 LAB — COMPREHENSIVE METABOLIC PANEL
ALBUMIN: 3.2 g/dL — AB (ref 3.5–5.2)
ALT: 416 U/L — ABNORMAL HIGH (ref 0–53)
AST: 101 U/L — AB (ref 0–37)
Alkaline Phosphatase: 151 U/L — ABNORMAL HIGH (ref 39–117)
BUN: 15 mg/dL (ref 6–23)
CO2: 29 mEq/L (ref 19–32)
Calcium: 8.9 mg/dL (ref 8.4–10.5)
Chloride: 103 mEq/L (ref 96–112)
Creatinine, Ser: 1.2 mg/dL (ref 0.40–1.50)
GFR: 67.77 mL/min (ref >60.00)
GLUCOSE: 59 mg/dL — AB (ref 70–99)
POTASSIUM: 3.9 meq/L (ref 3.5–5.1)
SODIUM: 138 meq/L (ref 135–145)
TOTAL PROTEIN: 6.6 g/dL (ref 6.0–8.3)
Total Bilirubin: 0.5 mg/dL (ref 0.2–1.2)

## 2014-08-12 LAB — C-REACTIVE PROTEIN: CRP: 2.4 mg/dL (ref 0.5–20.0)

## 2014-08-12 MED ORDER — DILTIAZEM GEL 2 %: CUTANEOUS | Status: DC

## 2014-08-12 NOTE — Assessment & Plan Note (Signed)
Continue current Rx See me in 3 months Labs today He is to contact me in 2 weeks re: ? Taper prednisone

## 2014-08-12 NOTE — Assessment & Plan Note (Signed)
Continue mirtazipine

## 2014-08-12 NOTE — Patient Instructions (Signed)
  Your physician has requested that you go to the basement for the following lab work before leaving today: CBC, CMET, C-Reative Protein  We have sent the following medications to your pharmacy for you to pick up at your convenience: Diltiazem gel rx faxed to [facility] [name] in several weeks to be advised on reducing your Prednisone.   Follow up with [name] in the office in 3 months.    I appreciate the opportunity to care for you. Silvano Rusk, M.D., [facility]

## 2014-08-12 NOTE — Progress Notes (Signed)
Subjective:    Patient ID: [name], male    DOB: 1965/11/28, 49 y.o.   MRN: [id number] Cc: Crohn's follow-up HPI  Occ rectal bleeding 6-8 stools/day - runny to loose Not much if any rectal pain  Eliminated dairy and feels better Thinking about eliminating bread Does not want any anti-diarrheals   Mood ok - not depressed or too frustrated at this time  Medications, allergies, past medical history, past surgical history, family history and social history are reviewed and updated in the EMR.  Review of Systems As above    Objective:   Physical Exam _0  132/84 mmHg  Pulse 88  Ht 6' 1.25" (1.861 m)  Wt 216 lb 4 oz (98.09 kg)  BMI 28.32 kg/m2@  General:  Well-developed, well-nourished and in no acute distress Eyes:  anicteric. Lungs: Clear to auscultation bilaterally. Heart:  S1S2, no rubs, murmurs, gallops. Abdomen:  soft, non-tender, no hepatosplenomegaly, hernia, or mass and BS+.  Rectal: Mild erythema perianal, no signs of fissure, abscess, palpation of perianal area no DRE Extremities:   Trace left LE ankle edema edema, cyanosis or clubbing Skin   mild papular trunk rash. Neuro:  A&O x 3.  Psych:  appropriate mood and  Affect.   Data Reviewed:  Wt Readings from Last 3 Encounters:  08/12/14 216 lb 4 oz (98.09 kg)  04/11/14 223 lb 4 oz (101.266 kg)  02/11/14 214 lb (97.07 kg)      Assessment & Plan:  CROHN'S DISEASE, LARGE AND SMALL INTESTINES Continue current Rx See me in 3 months Labs today He is to contact me in 2 weeks re: ? Taper prednisone   Autoimmune hepatitis-PSC overlap Labs today Continue prednisone  Major depressive disorder, recurrent episode, severe Continue mirtazipine   Current outpatient prescriptions:  .  Adalimumab (HUMIRA PEN) 40 MG/0.8ML PNKT, Inject 40 mg into the skin every 14 (fourteen) days. After the completion of the starter kit, Disp: 2 each, Rfl: 6 .  Adalimumab (HUMIRA PEN-CROHNS STARTER) 40 MG/0.8ML PNKT,  Inject 160 mg into the skin once., Disp: 1 each, Rfl: 0 .  ascorbic acid (VITAMIN C) 1000 MG tablet, Take 2,000 mg by mouth daily. , Disp: , Rfl:  .  Calcium Carb-Cholecalciferol (CALCIUM 1000 + D) 1000-800 MG-UNIT TABS, Take by mouth. Takes 2 (2026m) tabs bid, Disp: , Rfl:  .  cholecalciferol (VITAMIN D) 1000 UNITS tablet, Take 4,000 Units by mouth 2 (two) times daily. , Disp: , Rfl:  .  diltiazem 2 % GEL, Apply a pea size amount into rectum 2 times a day, Disp: 30 g, Rfl: 2 .  fluticasone (FLONASE) 50 MCG/ACT nasal spray, Place 2 sprays into the nose daily. Each nostril, Disp: , Rfl:  .  hydrocortisone (ANUSOL-HC) 2.5 % rectal cream, Place 1 application rectally 2 (two) times daily as needed for hemorrhoids or itching., Disp: 30 g, Rfl: 1 .  hydrOXYzine (ATARAX/VISTARIL) 25 MG tablet, Take 25 mg by mouth at bedtime. , Disp: , Rfl:  .  LORazepam (ATIVAN) 1 MG tablet, Take 1 tablet (1 mg total) by mouth 3 (three) times daily as needed for anxiety., Disp: 90 tablet, Rfl: 0 .  mirtazapine (REMERON) 30 MG tablet, Take 30 mg by mouth at bedtime., Disp: , Rfl:  .  mupirocin ointment (BACTROBAN) 2 %, , Disp: , Rfl:  .  predniSONE (DELTASONE) 20 MG tablet, Take 2 tablets (40 mg total) by mouth daily with breakfast., Disp: 60 tablet, Rfl: 2 .  vitamin A 25000 UNIT  capsule, Take 25,000 Units by mouth daily., Disp: , Rfl:   Lab Results  Component Value Date   ALT 416* 08/12/2014   ALT 416* 08/12/2014   AST 101* 08/12/2014   AST 101* 08/12/2014   ALKPHOS 151* 08/12/2014   ALKPHOS 151* 08/12/2014   BILITOT 0.5 08/12/2014   BILITOT 0.5 08/12/2014   Lab Results  Component Value Date   CREATININE 1.20 08/12/2014   BUN 15 08/12/2014   NA 138 08/12/2014   K 3.9 08/12/2014   CL 103 08/12/2014   CO2 29 08/12/2014   Lab Results  Component Value Date   WBC 18.1 cH* 08/12/2014   HGB 15.2 08/12/2014   HCT 46.0 08/12/2014   MCV 91.1 08/12/2014   PLT 293.0 08/12/2014   I have been concerned about  his vit A use and have asked him to stop it before as I am concerned it could be causing hepatotoxicity and other sxs.  Vit A levels unreliable due to this being stored in the liver.  Will ask him to stop it.  Cc: Melinda Crutch, MD

## 2014-08-12 NOTE — Assessment & Plan Note (Addendum)
Labs today Continue prednisone

## 2014-08-14 ENCOUNTER — Encounter: Payer: Self-pay | Admitting: Internal Medicine

## 2014-08-14 NOTE — Progress Notes (Signed)
Quick Note:  Labs stable abnl or ok mostly - will ask him to stop Vit A and see what happens to LFT's See my chart ______

## 2014-08-15 ENCOUNTER — Encounter: Payer: Self-pay | Admitting: Internal Medicine

## 2014-08-20 ENCOUNTER — Telehealth: Payer: Self-pay

## 2014-08-20 DIAGNOSIS — R945 Abnormal results of liver function studies: Principal | ICD-10-CM

## 2014-08-20 DIAGNOSIS — R7989 Other specified abnormal findings of blood chemistry: Secondary | ICD-10-CM

## 2014-08-20 DIAGNOSIS — R739 Hyperglycemia, unspecified: Secondary | ICD-10-CM

## 2014-08-20 NOTE — Telephone Encounter (Signed)
-----   Message from [name], MD sent at 08/19/2014  9:03 PM EDT ----- Regarding: labs Please have him do LFT's in 2 weeks and also do Hgb A1c then -   For Hgb A1c try abnormal blood glucose level dx abnl LFT use cirrhosis

## 2014-08-20 NOTE — Telephone Encounter (Signed)
New orders entered

## 2014-08-21 DIAGNOSIS — D2261 Melanocytic nevi of right upper limb, including shoulder: Secondary | ICD-10-CM | POA: Diagnosis not present

## 2014-08-21 DIAGNOSIS — L821 Other seborrheic keratosis: Secondary | ICD-10-CM | POA: Diagnosis not present

## 2014-08-21 DIAGNOSIS — C44519 Basal cell carcinoma of skin of other part of trunk: Secondary | ICD-10-CM | POA: Diagnosis not present

## 2014-08-21 DIAGNOSIS — Z85828 Personal history of other malignant neoplasm of skin: Secondary | ICD-10-CM | POA: Diagnosis not present

## 2014-08-21 DIAGNOSIS — C44712 Basal cell carcinoma of skin of right lower limb, including hip: Secondary | ICD-10-CM | POA: Diagnosis not present

## 2014-08-21 DIAGNOSIS — D225 Melanocytic nevi of trunk: Secondary | ICD-10-CM | POA: Diagnosis not present

## 2014-08-30 ENCOUNTER — Encounter: Payer: Self-pay | Admitting: Internal Medicine

## 2014-09-18 ENCOUNTER — Other Ambulatory Visit (INDEPENDENT_AMBULATORY_CARE_PROVIDER_SITE_OTHER): Payer: Medicare Other

## 2014-09-18 DIAGNOSIS — R739 Hyperglycemia, unspecified: Secondary | ICD-10-CM | POA: Diagnosis not present

## 2014-09-18 DIAGNOSIS — R945 Abnormal results of liver function studies: Secondary | ICD-10-CM

## 2014-09-18 DIAGNOSIS — R7989 Other specified abnormal findings of blood chemistry: Secondary | ICD-10-CM

## 2014-09-18 LAB — HEPATIC FUNCTION PANEL
ALK PHOS: 102 U/L (ref 39–117)
ALT: 113 U/L — AB (ref 0–53)
AST: 34 U/L (ref 0–37)
Albumin: 2.9 g/dL — ABNORMAL LOW (ref 3.5–5.2)
BILIRUBIN DIRECT: 0 mg/dL (ref 0.0–0.3)
TOTAL PROTEIN: 5.8 g/dL — AB (ref 6.0–8.3)
Total Bilirubin: 0.3 mg/dL (ref 0.2–1.2)

## 2014-09-18 LAB — HEMOGLOBIN A1C: HEMOGLOBIN A1C: 6.1 % (ref 4.6–6.5)

## 2014-09-19 ENCOUNTER — Encounter: Payer: Self-pay | Admitting: Internal Medicine

## 2014-09-19 NOTE — Progress Notes (Signed)
Quick Note:  LFT's are better A1c "prediabetic" ______

## 2014-10-01 ENCOUNTER — Encounter: Payer: Self-pay | Admitting: Internal Medicine

## 2014-10-02 ENCOUNTER — Encounter: Payer: Self-pay | Admitting: Nurse Practitioner

## 2014-10-02 ENCOUNTER — Ambulatory Visit (INDEPENDENT_AMBULATORY_CARE_PROVIDER_SITE_OTHER): Payer: Medicare Other | Admitting: Nurse Practitioner

## 2014-10-02 VITALS — BP 136/100 | HR 96 | Ht 73.25 in | Wt 223.4 lb

## 2014-10-02 DIAGNOSIS — K50811 Crohn's disease of both small and large intestine with rectal bleeding: Secondary | ICD-10-CM | POA: Diagnosis not present

## 2014-10-02 DIAGNOSIS — K602 Anal fissure, unspecified: Secondary | ICD-10-CM | POA: Diagnosis not present

## 2014-10-02 MED ORDER — LIALDA 1.2 G PO TBEC: 1.2000 g | DELAYED_RELEASE_TABLET | Freq: Three times a day (TID) | ORAL | Status: DC

## 2014-10-02 MED ORDER — PREDNISONE 20 MG PO TABS: ORAL_TABLET | ORAL | Status: DC

## 2014-10-02 MED ORDER — HYDROCODONE-ACETAMINOPHEN 5-325 MG PO TABS: 1.0000 | ORAL_TABLET | Freq: Four times a day (QID) | ORAL | Status: DC | PRN

## 2014-10-02 NOTE — Patient Instructions (Signed)
Continue the Diltiazem Gel 3 times daily for 4 weeks.  We did make an appointment with [name] for 11-28-2014 for 9:45 am.   If you are still having rectal pain with the fissure, call our office and we will get in in sooner.  We have given you a prescription to take to your pharmacy , Hydrocodone 5/325 mg.

## 2014-10-03 ENCOUNTER — Encounter: Payer: Self-pay | Admitting: Nurse Practitioner

## 2014-10-03 DIAGNOSIS — K602 Anal fissure, unspecified: Secondary | ICD-10-CM | POA: Insufficient documentation

## 2014-10-03 NOTE — Progress Notes (Signed)
     History of Present Illness:   Patient is a 49 year old male known to [name]. He has a history of [place] with possible hepatitis overlap and Crohn's disease. He is maintained on Humira, Lialda and prednisone. Prednisone currently at 40m daily.   He has several loose BMs a day, this is not new.  He has occasional rectal bleeding and this is chronic too.   Patient comes in today for rectal pain. Patient gives a history of anal fissures. He used Diltiazem gel in the past and restarted himself on it several days.  He has also been using proctosol but rectal pain not any better. Pain worse with defecation.    Current Medications, Allergies, Past Medical History, Past Surgical History, Family History and Social History were reviewed in [employer].   Physical Exam: General: Pleasant, well developed , white male in no acute distress Ears: Normal auditory acuity Lungs: Clear throughout to auscultation Heart: Regular rate and rhythm Abdomen: Soft, non distended, non-tender. No masses, no hepatomegaly. Normal bowel sounds Rectal: No obvious fissures or abscess.  Attempted DRE but had to abort because of extreme anal discomfort at posterior midline.   Neurological: Alert oriented x 4, grossly nonfocal Psychological:  Alert and cooperative. Normal mood and affect  Assessment and Recommendations:  [age]year old male with Crohn's disease followed by Dr. [name] He is maintained on Humira and Lialda and Prednisone  (currently at 366mdaily). Still has several loose stools a day and occasional rectal bleeding. Plan is to start  Prednisone taper soon. His main problem at present is rectal pain which I suspect is secondary to an anal fissure. Patient restarted Diltiazem gelTID several days ago. Explained that a fissure may take several weeks to heal. Reminded patient about how to administer the Diltiazem gel. Follow up in 4-5 weeks, or sooner if gets worse. I will give  him some hydrocodone for the perianal pain.   2. PSC with possible autoimmune hepatitis overlap. Most recent LFTs show significant improvement.

## 2014-10-19 ENCOUNTER — Other Ambulatory Visit: Payer: Self-pay | Admitting: Internal Medicine

## 2014-10-19 NOTE — Progress Notes (Signed)
Agree with Ms. Guenther's assessment and plan. [name], MD, [facility]

## 2014-10-23 ENCOUNTER — Encounter: Payer: Self-pay | Admitting: Internal Medicine

## 2014-10-24 ENCOUNTER — Other Ambulatory Visit: Payer: Self-pay | Admitting: Internal Medicine

## 2014-10-24 DIAGNOSIS — K6289 Other specified diseases of anus and rectum: Secondary | ICD-10-CM

## 2014-10-24 NOTE — Telephone Encounter (Signed)
Called and discussed - plan for office eval and ok to refill hydrocodone

## 2014-10-25 ENCOUNTER — Ambulatory Visit (INDEPENDENT_AMBULATORY_CARE_PROVIDER_SITE_OTHER): Payer: Medicare Other | Admitting: Internal Medicine

## 2014-10-25 ENCOUNTER — Encounter: Payer: Self-pay | Admitting: Internal Medicine

## 2014-10-25 VITALS — BP 116/80 | HR 84 | Ht 73.25 in | Wt 222.4 lb

## 2014-10-25 DIAGNOSIS — K602 Anal fissure, unspecified: Secondary | ICD-10-CM

## 2014-10-25 DIAGNOSIS — K50819 Crohn's disease of both small and large intestine with unspecified complications: Secondary | ICD-10-CM | POA: Diagnosis not present

## 2014-10-25 MED ORDER — HYDROCODONE-ACETAMINOPHEN 5-325 MG PO TABS: 1.0000 | ORAL_TABLET | Freq: Four times a day (QID) | ORAL | Status: DC | PRN

## 2014-10-25 NOTE — Progress Notes (Signed)
   Subjective:    Patient ID: [name], male    DOB: 22-Mar-1965, 49 y.o.   MRN: [id number] Cc: rectal pain, diarrhea w/ Crohn's and anal fissure HPI Having less but persistent rectal pain Still w/ freq loose stools No fever, no bleeding Mild RLQ pain Says new lactose free milk helped in past few days Is using diltiazem gel bid-tid - heping Needs 1/2 hydrocodone/APAP 5/325 bid - asking for refill  Medications, allergies, past medical history, past surgical history, family history and social history are reviewed and updated in the EMR.   Review of Systems As above    Objective:   Physical Exam BP 116/80 mmHg  Pulse 84  Ht 6' 1.25" (1.861 m)  Wt 222 lb 6 oz (100.869 kg)  BMI 29.12 kg/m2 Abd soft, + striae, nontender, prominent vv Rectal - mild perianal erythema, slight stool soft in perianal area - ++ anal stenosis, tender, moderate, advanced 5th digit to first joint almost.     Assessment & Plan:  CROHN'S DISEASE, LARGE AND SMALL INTESTINES Continue current Tx Humira and prednisone Says lactose free milk helped recently Sill see late Sept as planned and then consider repeat colonoscopy assessment   Posterior Anal fissure Diltiazem tid Control diarrhea recticare   Cc; Melinda Crutch, MD

## 2014-10-25 NOTE — Patient Instructions (Addendum)
Please keep your 11/28/14 appointment with Dr Carlean Purl.     We are giving you a printed rx for Norco to take to the pharmacy.    I appreciate the opportunity to care for you.

## 2014-10-25 NOTE — Assessment & Plan Note (Signed)
Diltiazem tid Control diarrhea recticare

## 2014-10-25 NOTE — Assessment & Plan Note (Addendum)
Continue current Tx Humira and prednisone Says lactose free milk helped recently [facility] see late Sept as planned and then consider repeat colonoscopy assessment

## 2014-10-25 NOTE — Telephone Encounter (Signed)
This encounter was created in error - please disregard.

## 2014-11-21 ENCOUNTER — Other Ambulatory Visit: Payer: Self-pay | Admitting: Internal Medicine

## 2014-11-28 ENCOUNTER — Encounter: Payer: Self-pay | Admitting: Internal Medicine

## 2014-11-28 ENCOUNTER — Other Ambulatory Visit (INDEPENDENT_AMBULATORY_CARE_PROVIDER_SITE_OTHER): Payer: Medicare Other

## 2014-11-28 ENCOUNTER — Ambulatory Visit (INDEPENDENT_AMBULATORY_CARE_PROVIDER_SITE_OTHER): Payer: Medicare Other | Admitting: Internal Medicine

## 2014-11-28 VITALS — BP 140/100 | HR 88 | Ht 73.25 in | Wt 209.0 lb

## 2014-11-28 DIAGNOSIS — Z79899 Other long term (current) drug therapy: Secondary | ICD-10-CM

## 2014-11-28 DIAGNOSIS — K50818 Crohn's disease of both small and large intestine with other complication: Secondary | ICD-10-CM

## 2014-11-28 DIAGNOSIS — K754 Autoimmune hepatitis: Secondary | ICD-10-CM

## 2014-11-28 DIAGNOSIS — Z7952 Long term (current) use of systemic steroids: Secondary | ICD-10-CM

## 2014-11-28 DIAGNOSIS — G4700 Insomnia, unspecified: Secondary | ICD-10-CM | POA: Diagnosis not present

## 2014-11-28 DIAGNOSIS — Z23 Encounter for immunization: Secondary | ICD-10-CM | POA: Diagnosis not present

## 2014-11-28 DIAGNOSIS — Z7960 Long term (current) use of unspecified immunomodulators and immunosuppressants: Secondary | ICD-10-CM | POA: Insufficient documentation

## 2014-11-28 LAB — COMPREHENSIVE METABOLIC PANEL
ALT: 34 U/L (ref 0–53)
AST: 23 U/L (ref 0–37)
Albumin: 3.3 g/dL — ABNORMAL LOW (ref 3.5–5.2)
Alkaline Phosphatase: 77 U/L (ref 39–117)
BILIRUBIN TOTAL: 0.5 mg/dL (ref 0.2–1.2)
BUN: 18 mg/dL (ref 6–23)
CALCIUM: 8.9 mg/dL (ref 8.4–10.5)
CO2: 29 meq/L (ref 19–32)
Chloride: 105 mEq/L (ref 96–112)
Creatinine, Ser: 1.12 mg/dL (ref 0.40–1.50)
GFR: 73.3 mL/min (ref >60.00)
Glucose, Bld: 63 mg/dL — ABNORMAL LOW (ref 70–99)
Potassium: 4.7 mEq/L (ref 3.5–5.1)
Sodium: 140 mEq/L (ref 135–145)
Total Protein: 6.4 g/dL (ref 6.0–8.3)

## 2014-11-28 MED ORDER — CLONAZEPAM 0.5 MG PO TABS: 0.5000 mg | ORAL_TABLET | Freq: Every evening | ORAL | Status: DC | PRN

## 2014-11-28 NOTE — Patient Instructions (Addendum)
  Your physician has requested that you go to the basement for the following lab work before leaving today: CMET  We have sent the following medications to your pharmacy for you to pick up at your convenience: Klonopin, faxed in  Today you have been given a flu vaccine.   I appreciate the opportunity to care for you.

## 2014-11-28 NOTE — Progress Notes (Signed)
Subjective:    Patient ID: [name], male    DOB: 1965/12/11, 49 y.o.   MRN: [id number] Chief complaint: Follow-up Crohn's disease and autoimmune liver disease HPI  [name] reports that he is feeling better overall. Not using hydrocodone Some mild rectal spasm with defecation Less frequent stools Has eliminated sugar, dairy and wheat Overall feels better  Wt Readings from Last 3 Encounters:  11/28/14 209 lb (94.802 kg)  10/25/14 222 lb 6 oz (100.869 kg)  10/02/14 223 lb 6.4 oz (101.334 kg)  reluctant to reduce prednisone, "I would like to have formed stools and I only cut out the dairy wheat and sugar 2 weeks ago"  Sleep at night a problem - using 1/2 Remeron and then another 1/2 went to a half "It was bothering my stomach"  Review of Systems As above. No depression reported or noticed.    Objective:   Physical Exam @BP  140/100 mmHg  Pulse 88  Ht 6' 1.25" (1.861 m)  Wt 209 lb (94.802 kg)  BMI 27.37 kg/m2@  General:  NAD, cushingoid Eyes:   anicteric Lungs:  clear Heart:: S1S2 no rubs, murmurs or gallops Abdomen:  soft and nontender, BS+ Ext:   no edema, cyanosis or clubbing    Data Reviewed:  Prior GI notes, recent imaging labs in the EMR from 2016.    Assessment & Plan:  CROHN'S DISEASE, LARGE AND SMALL INTESTINES better, continue Humira try to taper prednisone though he says he is not ready to do that yet. Return to see me in about 3 months. Flu shot today.  Autoimmune hepatitis-PSC overlap Recheck LFT/CMET, he remains on prednisone  Long-term use of immunosuppressant medication - Humira Flu shot today. No signs of problems.  Long term current use of systemic steroids He does look cushingoid. I've explained to him in the past the potential problems and long-term steroid-induced and the need to try to minimize the dose. He says he will call me in 2 weeks to let me know how he is doing we'll try to reduce the dose.  Insomnia Since he seems to be  well out from his depressive episode in the past, and doing well in that perspective I will prescribe a small number of clonazepam 0.5 mg to be used at bedtime when necessary insomnia.     Current outpatient prescriptions:  .  ascorbic acid (VITAMIN C) 1000 MG tablet, Take 2,000 mg by mouth daily. , Disp: , Rfl:  .  Calcium Carb-Cholecalciferol (CALCIUM 1000 + D) 1000-800 MG-UNIT TABS, Take by mouth. Takes 2 (2013m) tabs bid, Disp: , Rfl:  .  cholecalciferol (VITAMIN D) 1000 UNITS tablet, Take 4,000 Units by mouth 2 (two) times daily. , Disp: , Rfl:  .  diltiazem 2 % GEL, Apply a pea size amount into rectum 2 times a day, Disp: 30 g, Rfl: 2 .  fluticasone (FLONASE) 50 MCG/ACT nasal spray, Place 2 sprays into the nose daily. Each nostril, Disp: , Rfl:  .  HUMIRA PEN 40 MG/0.8ML PNKT, INJECT 40 MG INTO THE SKIN EVERY 14 DAYS AFTER THE COMPLETION OF THE STARTER KIT., Disp: 2 each, Rfl: 0 .  HYDROcodone-acetaminophen (NORCO/VICODIN) 5-325 MG per tablet, Take 1 tablet by mouth every 6 (six) hours as needed for moderate pain., Disp: 45 tablet, Rfl: 0 .  hydrocortisone (ANUSOL-HC) 2.5 % rectal cream, Place 1 application rectally 2 (two) times daily as needed for hemorrhoids or itching., Disp: 30 g, Rfl: 1 .  hydrOXYzine (ATARAX/VISTARIL) 25 MG tablet,  Take 25 mg by mouth at bedtime. , Disp: , Rfl:  .  mirtazapine (REMERON) 30 MG tablet, Take 30 mg by mouth at bedtime., Disp: , Rfl:  .  mupirocin ointment (BACTROBAN) 2 %, , Disp: , Rfl:  .  predniSONE (DELTASONE) 20 MG tablet, Take 1.5 tablets (30 mg total) by mouth daily., Disp: 45 tablet, Rfl: 1 .  vitamin A 25000 UNIT capsule, Take 25,000 Units by mouth daily., Disp: , Rfl:  .  clonazePAM (KLONOPIN) 0.5 MG tablet, Take 1 tablet (0.5 mg total) by mouth at bedtime as needed (insomnia)., Disp: 20 tablet, Rfl: 0  CC:  Melinda Crutch, MD

## 2014-11-28 NOTE — Assessment & Plan Note (Addendum)
better, continue Humira try to taper prednisone though he says he is not ready to do that yet. Return to see me in about 3 months. Flu shot today.

## 2014-11-28 NOTE — Assessment & Plan Note (Addendum)
Recheck LFT/CMET, he remains on prednisone

## 2014-11-29 DIAGNOSIS — G4700 Insomnia, unspecified: Secondary | ICD-10-CM | POA: Insufficient documentation

## 2014-11-29 NOTE — Assessment & Plan Note (Signed)
He does look cushingoid. I've explained to him in the past the potential problems and long-term steroid-induced and the need to try to minimize the dose. He says he will call me in 2 weeks to let me know how he is doing we'll try to reduce the dose.

## 2014-11-29 NOTE — Assessment & Plan Note (Signed)
Flu shot today. No signs of problems.

## 2014-11-29 NOTE — Assessment & Plan Note (Signed)
Since he seems to be well out from his depressive episode in the past, and doing well in that perspective I will prescribe a small number of clonazepam 0.5 mg to be used at bedtime when necessary insomnia.

## 2014-12-01 NOTE — Progress Notes (Signed)
Quick Note:  LFT's normal! My Chart note ______

## 2014-12-05 DIAGNOSIS — K50913 Crohn's disease, unspecified, with fistula: Secondary | ICD-10-CM | POA: Diagnosis not present

## 2014-12-05 DIAGNOSIS — E559 Vitamin D deficiency, unspecified: Secondary | ICD-10-CM | POA: Diagnosis not present

## 2014-12-05 DIAGNOSIS — Z0001 Encounter for general adult medical examination with abnormal findings: Secondary | ICD-10-CM | POA: Diagnosis not present

## 2014-12-05 DIAGNOSIS — F322 Major depressive disorder, single episode, severe without psychotic features: Secondary | ICD-10-CM | POA: Diagnosis not present

## 2014-12-05 DIAGNOSIS — G4700 Insomnia, unspecified: Secondary | ICD-10-CM | POA: Diagnosis not present

## 2014-12-05 DIAGNOSIS — F411 Generalized anxiety disorder: Secondary | ICD-10-CM | POA: Diagnosis not present

## 2014-12-13 ENCOUNTER — Encounter: Payer: Self-pay | Admitting: Internal Medicine

## 2014-12-19 ENCOUNTER — Other Ambulatory Visit: Payer: Self-pay | Admitting: Internal Medicine

## 2014-12-27 ENCOUNTER — Encounter: Payer: Self-pay | Admitting: Internal Medicine

## 2014-12-27 ENCOUNTER — Other Ambulatory Visit: Payer: Self-pay | Admitting: Internal Medicine

## 2014-12-27 NOTE — Telephone Encounter (Signed)
Ok to refill Sir?

## 2014-12-27 NOTE — Telephone Encounter (Signed)
Ok to refill - he is taking 20 mg daily so # 30 w/ 2 rf

## 2014-12-30 ENCOUNTER — Encounter: Payer: Self-pay | Admitting: Internal Medicine

## 2014-12-31 ENCOUNTER — Encounter: Payer: Self-pay | Admitting: Internal Medicine

## 2015-01-02 ENCOUNTER — Telehealth: Payer: Self-pay

## 2015-01-02 ENCOUNTER — Other Ambulatory Visit: Payer: Self-pay | Admitting: Internal Medicine

## 2015-01-02 MED ORDER — PREDNISONE 20 MG PO TABS: ORAL_TABLET | ORAL | Status: DC

## 2015-01-02 NOTE — Telephone Encounter (Signed)
Left message for [name] to call me back to set up appointment.

## 2015-01-02 NOTE — Telephone Encounter (Signed)
-----   Message from [name], MD sent at 01/01/2015  4:57 PM EDT ----- Regarding: appt Please contact him and have him see me in late Nov or Dec -

## 2015-01-03 ENCOUNTER — Telehealth: Payer: Self-pay | Admitting: Internal Medicine

## 2015-01-03 NOTE — Telephone Encounter (Signed)
Spoke with patient and made him f/u appointment for 02/19/15.

## 2015-01-11 ENCOUNTER — Other Ambulatory Visit: Payer: Self-pay | Admitting: Internal Medicine

## 2015-01-14 ENCOUNTER — Other Ambulatory Visit: Payer: Self-pay | Admitting: Internal Medicine

## 2015-01-14 ENCOUNTER — Encounter: Payer: Self-pay | Admitting: Internal Medicine

## 2015-01-15 ENCOUNTER — Other Ambulatory Visit: Payer: Self-pay | Admitting: Internal Medicine

## 2015-01-15 ENCOUNTER — Other Ambulatory Visit: Payer: Self-pay

## 2015-01-15 MED ORDER — HYDROCORTISONE 2.5 % RE CREA: 1.0000 "application " | TOPICAL_CREAM | Freq: Two times a day (BID) | RECTAL | Status: DC | PRN

## 2015-01-15 NOTE — Telephone Encounter (Signed)
Patient calling in regarding this.  °

## 2015-01-15 NOTE — Telephone Encounter (Signed)
Sent patient My Chart message saying the rectal cream rx had been sent to [facility] as requested.

## 2015-01-21 ENCOUNTER — Encounter: Payer: Self-pay | Admitting: Internal Medicine

## 2015-01-22 ENCOUNTER — Other Ambulatory Visit: Payer: Self-pay

## 2015-01-22 MED ORDER — HYDROCODONE-ACETAMINOPHEN 5-325 MG PO TABS: 1.0000 | ORAL_TABLET | Freq: Four times a day (QID) | ORAL | Status: DC | PRN

## 2015-01-22 NOTE — Telephone Encounter (Signed)
Per Dr Carlean Purl patient requesting Vicodin refill. Ok'ed by Dr Carlean Purl and put up front for pick up. [name] coming now since we close early.

## 2015-02-16 ENCOUNTER — Other Ambulatory Visit: Payer: Self-pay | Admitting: Internal Medicine

## 2015-02-18 ENCOUNTER — Encounter: Payer: Self-pay | Admitting: Internal Medicine

## 2015-02-19 ENCOUNTER — Encounter: Payer: Self-pay | Admitting: Internal Medicine

## 2015-02-19 ENCOUNTER — Ambulatory Visit (INDEPENDENT_AMBULATORY_CARE_PROVIDER_SITE_OTHER): Payer: Medicare Other | Admitting: Internal Medicine

## 2015-02-19 VITALS — BP 136/88 | HR 84 | Ht 73.25 in | Wt 217.0 lb

## 2015-02-19 DIAGNOSIS — K602 Anal fissure, unspecified: Secondary | ICD-10-CM | POA: Diagnosis not present

## 2015-02-19 DIAGNOSIS — K830 Cholangitis: Secondary | ICD-10-CM

## 2015-02-19 DIAGNOSIS — L565 Disseminated superficial actinic porokeratosis (DSAP): Secondary | ICD-10-CM | POA: Diagnosis not present

## 2015-02-19 DIAGNOSIS — D225 Melanocytic nevi of trunk: Secondary | ICD-10-CM | POA: Diagnosis not present

## 2015-02-19 DIAGNOSIS — Z85828 Personal history of other malignant neoplasm of skin: Secondary | ICD-10-CM | POA: Diagnosis not present

## 2015-02-19 DIAGNOSIS — K754 Autoimmune hepatitis: Secondary | ICD-10-CM

## 2015-02-19 DIAGNOSIS — L820 Inflamed seborrheic keratosis: Secondary | ICD-10-CM | POA: Diagnosis not present

## 2015-02-19 DIAGNOSIS — L821 Other seborrheic keratosis: Secondary | ICD-10-CM | POA: Diagnosis not present

## 2015-02-19 DIAGNOSIS — L111 Transient acantholytic dermatosis [Grover]: Secondary | ICD-10-CM | POA: Diagnosis not present

## 2015-02-19 DIAGNOSIS — K8301 Primary sclerosing cholangitis: Secondary | ICD-10-CM

## 2015-02-19 DIAGNOSIS — K50819 Crohn's disease of both small and large intestine with unspecified complications: Secondary | ICD-10-CM | POA: Diagnosis not present

## 2015-02-19 MED ORDER — HYDROCODONE-ACETAMINOPHEN 5-325 MG PO TABS: 1.0000 | ORAL_TABLET | Freq: Four times a day (QID) | ORAL | Status: DC | PRN

## 2015-02-19 MED ORDER — METRONIDAZOLE 500 MG PO TABS: 500.0000 mg | ORAL_TABLET | Freq: Three times a day (TID) | ORAL | Status: DC

## 2015-02-19 NOTE — Assessment & Plan Note (Signed)
LFTs were normal when last checkedlate September 2016

## 2015-02-19 NOTE — Assessment & Plan Note (Signed)
Last labs ok

## 2015-02-19 NOTE — Patient Instructions (Addendum)
   We have sent the following medications to your pharmacy for you to pick up at your convenience: Generic Flagyl  Today you have been given a printed rx for vicodin to take to the pharmacy.  Follow up with Dr Carlean Purl on 03/28/15 at 11:15AM    I appreciate the opportunity to care for you. Silvano Rusk, MD, [facility]

## 2015-02-19 NOTE — Assessment & Plan Note (Addendum)
Active, ? From Crohn's - metronidazole treatment prescribed, I don't see any obvious abscess or anything like that or feel. I can only do a limited rectal exam

## 2015-02-19 NOTE — Progress Notes (Signed)
   Subjective:    Patient ID: [name], male    DOB: September 18, 1965, 49 y.o.   MRN: [id number]  chief complaint is abdominal pain rectal pain follow-up of Crohn's disease HPI  He is having a bad day he feels bloated and is belching and he attributes to using hyoscyamine last night. He told me that his diarrhea has been better lately. He remains on 30 mg of prednisone and says he cannot reduce oral get diarrhea again. He was asking about switching biologic agents. He has been on Humira. His mom and dad had been ill and he's had to help them quite a bit and that's been somewhat stressful. He denies any significant abdominal pain today but does feel bloated. He says he has been constipated today. Wt Readings from Last 3 Encounters:  02/19/15 217 lb (98.431 kg)  11/28/14 209 lb (94.802 kg)  10/25/14 222 lb 6 oz (100.869 kg)   Medications, allergies, past medical history, past surgical history, family history and social history are reviewed and updated in the EMR.   Review of Systems  as above    Objective:   Physical Exam @BP  136/88 mmHg  Pulse 84  Ht 6' 1.25" (1.861 m)  Wt 217 lb (98.431 kg)  BMI 28.42 kg/m2@  General:  NAD but mildly agitated, cushingoid, belching Eyes:   anicteric Abdomen:  soft and nontender, BS+ and increased Ext:   no edema, cyanosis or clubbing     Assessment & Plan:  CROHN'S DISEASE, LARGE AND SMALL INTESTINES ? Flaring, I wonder if he could be developing a partial obstruction. [name] was not interested in having any studies today. I will add metronidazole 500 mg 3 times a day 14 days. He plans to contact me tomorrow with an update.  Posterior Anal fissure Active, ? From Crohn's - metronidazole treatment prescribed, I don't see any obvious abscess or anything like that or feel. I can only do a limited rectal exam  Primary sclerosing cholangitis ? hepatitis overlap Last labs ok   Autoimmune hepatitis-PSC overlap LFTs were normal when last  checkedlate September 2016   He was unwilling to  Have any diagnostic studies her labs and x-ray today. See him back in January. Sooner if needed.CC:  Melinda Crutch, MD

## 2015-02-19 NOTE — Assessment & Plan Note (Addendum)
?   Flaring, I wonder if he could be developing a partial obstruction. [name] was not interested in having any studies today. I will add metronidazole 500 mg 3 times a day 14 days. He plans to contact me tomorrow with an update.

## 2015-03-05 ENCOUNTER — Encounter: Payer: Self-pay | Admitting: Internal Medicine

## 2015-03-18 ENCOUNTER — Other Ambulatory Visit: Payer: Self-pay | Admitting: Internal Medicine

## 2015-03-21 ENCOUNTER — Encounter (HOSPITAL_COMMUNITY): Payer: Self-pay

## 2015-03-21 ENCOUNTER — Emergency Department (HOSPITAL_COMMUNITY)
Admission: EM | Admit: 2015-03-21 | Discharge: 2015-03-21 | Discharge status: Against Medical Advice | Payer: Medicare Other | Attending: Emergency Medicine | Admitting: Emergency Medicine

## 2015-03-21 ENCOUNTER — Emergency Department (HOSPITAL_COMMUNITY): Payer: Medicare Other

## 2015-03-21 DIAGNOSIS — F329 Major depressive disorder, single episode, unspecified: Secondary | ICD-10-CM | POA: Diagnosis not present

## 2015-03-21 DIAGNOSIS — Z86010 Personal history of colonic polyps: Secondary | ICD-10-CM | POA: Insufficient documentation

## 2015-03-21 DIAGNOSIS — S0001XA Abrasion of scalp, initial encounter: Secondary | ICD-10-CM | POA: Diagnosis not present

## 2015-03-21 DIAGNOSIS — Z792 Long term (current) use of antibiotics: Secondary | ICD-10-CM | POA: Diagnosis not present

## 2015-03-21 DIAGNOSIS — Z872 Personal history of diseases of the skin and subcutaneous tissue: Secondary | ICD-10-CM | POA: Insufficient documentation

## 2015-03-21 DIAGNOSIS — Z8669 Personal history of other diseases of the nervous system and sense organs: Secondary | ICD-10-CM | POA: Diagnosis not present

## 2015-03-21 DIAGNOSIS — K50111 Crohn's disease of large intestine with rectal bleeding: Secondary | ICD-10-CM | POA: Insufficient documentation

## 2015-03-21 DIAGNOSIS — Z87891 Personal history of nicotine dependence: Secondary | ICD-10-CM | POA: Insufficient documentation

## 2015-03-21 DIAGNOSIS — F419 Anxiety disorder, unspecified: Secondary | ICD-10-CM | POA: Diagnosis not present

## 2015-03-21 DIAGNOSIS — Y9289 Other specified places as the place of occurrence of the external cause: Secondary | ICD-10-CM | POA: Insufficient documentation

## 2015-03-21 DIAGNOSIS — T148 Other injury of unspecified body region: Secondary | ICD-10-CM | POA: Diagnosis not present

## 2015-03-21 DIAGNOSIS — Z7952 Long term (current) use of systemic steroids: Secondary | ICD-10-CM | POA: Diagnosis not present

## 2015-03-21 DIAGNOSIS — D62 Acute posthemorrhagic anemia: Secondary | ICD-10-CM | POA: Diagnosis not present

## 2015-03-21 DIAGNOSIS — S199XXA Unspecified injury of neck, initial encounter: Secondary | ICD-10-CM | POA: Diagnosis not present

## 2015-03-21 DIAGNOSIS — Y9389 Activity, other specified: Secondary | ICD-10-CM | POA: Insufficient documentation

## 2015-03-21 DIAGNOSIS — M1990 Unspecified osteoarthritis, unspecified site: Secondary | ICD-10-CM | POA: Diagnosis not present

## 2015-03-21 DIAGNOSIS — Z79899 Other long term (current) drug therapy: Secondary | ICD-10-CM | POA: Diagnosis not present

## 2015-03-21 DIAGNOSIS — Z8639 Personal history of other endocrine, nutritional and metabolic disease: Secondary | ICD-10-CM | POA: Insufficient documentation

## 2015-03-21 DIAGNOSIS — R569 Unspecified convulsions: Secondary | ICD-10-CM | POA: Diagnosis not present

## 2015-03-21 DIAGNOSIS — X58XXXA Exposure to other specified factors, initial encounter: Secondary | ICD-10-CM | POA: Diagnosis not present

## 2015-03-21 DIAGNOSIS — Z8619 Personal history of other infectious and parasitic diseases: Secondary | ICD-10-CM | POA: Diagnosis not present

## 2015-03-21 DIAGNOSIS — Z7951 Long term (current) use of inhaled steroids: Secondary | ICD-10-CM | POA: Diagnosis not present

## 2015-03-21 DIAGNOSIS — Z8719 Personal history of other diseases of the digestive system: Secondary | ICD-10-CM | POA: Insufficient documentation

## 2015-03-21 DIAGNOSIS — K529 Noninfective gastroenteritis and colitis, unspecified: Secondary | ICD-10-CM | POA: Insufficient documentation

## 2015-03-21 DIAGNOSIS — I10 Essential (primary) hypertension: Secondary | ICD-10-CM | POA: Insufficient documentation

## 2015-03-21 DIAGNOSIS — R55 Syncope and collapse: Secondary | ICD-10-CM | POA: Diagnosis not present

## 2015-03-21 DIAGNOSIS — S0180XA Unspecified open wound of other part of head, initial encounter: Secondary | ICD-10-CM | POA: Diagnosis not present

## 2015-03-21 DIAGNOSIS — R42 Dizziness and giddiness: Secondary | ICD-10-CM | POA: Diagnosis not present

## 2015-03-21 DIAGNOSIS — K50911 Crohn's disease, unspecified, with rectal bleeding: Secondary | ICD-10-CM | POA: Diagnosis not present

## 2015-03-21 DIAGNOSIS — Z85828 Personal history of other malignant neoplasm of skin: Secondary | ICD-10-CM | POA: Diagnosis not present

## 2015-03-21 DIAGNOSIS — Y999 Unspecified external cause status: Secondary | ICD-10-CM | POA: Insufficient documentation

## 2015-03-21 DIAGNOSIS — I81 Portal vein thrombosis: Secondary | ICD-10-CM

## 2015-03-21 HISTORY — DX: Portal vein thrombosis: I81

## 2015-03-21 LAB — CBC WITH DIFFERENTIAL/PLATELET
BASOS PCT: 0 %
Basophils Absolute: 0 10*3/uL (ref 0.0–0.1)
EOS PCT: 0 %
Eosinophils Absolute: 0 10*3/uL (ref 0.0–0.7)
HCT: 33 % — ABNORMAL LOW (ref 39.0–52.0)
HEMOGLOBIN: 9.7 g/dL — AB (ref 13.0–17.0)
LYMPHS PCT: 15 %
Lymphs Abs: 2.3 10*3/uL (ref 0.7–4.0)
MCH: 21.6 pg — AB (ref 26.0–34.0)
MCHC: 29.4 g/dL — AB (ref 30.0–36.0)
MCV: 73.3 fL — AB (ref 78.0–100.0)
MONO ABS: 1.4 10*3/uL — AB (ref 0.1–1.0)
Monocytes Relative: 9 %
NEUTROS ABS: 11.3 10*3/uL — AB (ref 1.7–7.7)
NEUTROS PCT: 76 %
Platelets: 294 10*3/uL (ref 150–400)
RBC: 4.5 MIL/uL (ref 4.22–5.81)
RDW: 18.9 % — ABNORMAL HIGH (ref 11.5–15.5)
WBC: 15 10*3/uL — ABNORMAL HIGH (ref 4.0–10.5)

## 2015-03-21 LAB — BASIC METABOLIC PANEL
Anion gap: 9 (ref 5–15)
BUN: 19 mg/dL (ref 6–20)
CALCIUM: 8.1 mg/dL — AB (ref 8.9–10.3)
CO2: 24 mmol/L (ref 22–32)
CREATININE: 1.15 mg/dL (ref 0.61–1.24)
Chloride: 104 mmol/L (ref 101–111)
GFR calc Af Amer: >60 mL/min (ref >60)
Glucose, Bld: 80 mg/dL (ref 65–99)
POTASSIUM: 3 mmol/L — AB (ref 3.5–5.1)
Sodium: 137 mmol/L (ref 135–145)

## 2015-03-21 LAB — TYPE AND SCREEN
ABO/RH(D): O POS
Antibody Screen: NEGATIVE

## 2015-03-21 LAB — I-STAT TROPONIN, ED: TROPONIN I, POC: 0.01 ng/mL (ref 0.00–0.08)

## 2015-03-21 LAB — CBG MONITORING, ED: GLUCOSE-CAPILLARY: 140 mg/dL — AB (ref 65–99)

## 2015-03-21 LAB — ABO/RH: ABO/RH(D): O POS

## 2015-03-21 MED ORDER — SODIUM CHLORIDE 0.9 % IV SOLN
1000.0000 mL | Freq: Once | INTRAVENOUS | Status: AC
  Administered 2015-03-21: 1000 mL via INTRAVENOUS

## 2015-03-21 MED ORDER — SODIUM CHLORIDE 0.9 % IV SOLN
1000.0000 mL | INTRAVENOUS | Status: DC
  Administered 2015-03-21: 1000 mL via INTRAVENOUS

## 2015-03-21 NOTE — ED Notes (Signed)
[facility]- pt coming in after a syncopal episode in [employer], pt fell and was noted to have seizure like activity. Pt was alert and oriented with EMS, no hx of seizures. C-collar in place due to LOC.

## 2015-03-21 NOTE — Discharge Instructions (Signed)
Crohn Disease Crohn disease is a long-lasting (chronic) disease that affects your gastrointestinal (GI) tract. It often causes irritation and swelling (inflammation) in your small intestine and the beginning of your large intestine. However, it can affect any part of your GI tract. Crohn disease is part of a group of illnesses that are known as inflammatory bowel disease (IBD). Crohn disease may start slowly and get worse over time. Symptoms may come and go. They may also disappear for months or even years at a time (remission). CAUSES The exact cause of Crohn disease is not known. It may be a response that causes your body's defense system (immune system) to mistakenly attack healthy cells and tissues (autoimmune response). Your genes and your environment may also play a role. RISK FACTORS You may be at greater risk for Crohn disease if you:  Have other family members with Crohn disease or another IBD.  Use any tobacco products, including cigarettes, chewing tobacco, or electronic cigarettes.  Are in your 69s.  Have [place] European ancestry. SIGNS AND SYMPTOMS The main signs and symptoms of Crohn disease involve your GI tract. These include:  Diarrhea.  Rectal bleeding.  An urgent need to move your bowels.  The feeling that you are not finished having a bowel movement.  Abdominal pain or cramping.  Constipation. General signs and symptoms of Crohn disease may also include:  Unexplained weight loss.  Fatigue.  Fever.  Nausea.  Loss of appetite.  Joint pain  Changes in vision.  Red bumps on your skin. DIAGNOSIS Your health care provider may suspect Crohn disease based on your symptoms and your medical history. Your health care provider will do a physical exam. You may need to see a health care provider who specializes in diseases of the digestive tract (gastroenterologist). You may also have tests to help your health care providers make a diagnosis. These may  include:  Blood tests.  Stool sample tests.  Imaging tests, such as X-rays and CT scans.  Tests to examine the inside of your intestines using a long, flexible tube that has a light and a camera on the end (endoscopy or colonoscopy).  A procedure to take tissue samples from inside your bowel (biopsy) to be examined under a microscope. TREATMENT  There is no cure for Crohn disease. Treatment will focus on managing your symptoms. Crohn disease affects each person differently. Your treatment may include:  Resting your bowels. Drinking only clear liquids or getting nutrition through an IV for a period of time gives your bowels a chance to heal because they are not passing stools.  Medicines. These may be used alone or in combination (combination therapy). These may include antibiotic medicines. You may be given medicines that help to:  Reduce inflammation.  Control your immune system activity.  Fight infections.  Relieve cramps and prevent diarrhea.  Control your pain.  Surgery. You may need surgery if:  Medicines and other treatments are no longer working.  You develop complications from severe Crohn disease.  A section of your intestine becomes so damaged that it needs to be removed. HOME CARE INSTRUCTIONS  Take medicines only as directed by your health care provider.  If you were prescribed an antibiotic medicine, finish it all even if you start to feel better.  Keep all follow-up visits as directed by your health care provider. This is important.  Talk with your health care provider about changing your diet. This may help your symptoms. Your health care provide may recommend changes, such  as:  Drinking more fluids.  Avoiding milk and other foods that contain lactose.  Eating a low-fat diet.  Avoiding high-fiber foods, such as popcorn and nuts.  Avoiding carbonated beverages, such as soda.  Eating smaller meals more often rather than eating large  meals.  Keeping a food diary to identify foods that make your symptoms better or worse.  Do not use any tobacco products, including cigarettes, chewing tobacco, or electronic cigarettes. If you need help quitting, ask your health care provider.  Limit alcohol intake to no more than 1 drink per day for nonpregnant women and 2 drinks per day for men. One drink equals 12 ounces of beer, 5 ounces of wine, or 1 ounces of hard liquor.  Exercise daily or as directed by your health care provider. SEEK MEDICAL CARE IF:  You have diarrhea, abdominal cramps, and other gastrointestinal problems that are present almost all of the time.  Your symptoms do not improve with treatment.  You continue to lose weight.  You develop a rash or sores on your skin.  You develop eye problems.  You have a fever.   Your symptoms get worse.  You develop new symptoms. SEEK IMMEDIATE MEDICAL CARE IF:  You have bloody diarrhea.  You develop severe abdominal pain.  You cannot pass stools.   This information is not intended to replace advice given to you by your health care provider. Make sure you discuss any questions you have with your health care provider.   Document Released: 11/25/2004 Document Revised: 03/08/2014 Document Reviewed: 10/03/2013 Elsevier Interactive Patient Education [employer].

## 2015-03-21 NOTE — ED Notes (Signed)
Pt returned from CT °

## 2015-03-21 NOTE — ED Provider Notes (Signed)
CSN: [id number]     Arrival date & time 03/21/15  1133 History   First MD Initiated Contact with Patient 03/21/15 1142     Chief Complaint  Patient presents with  . Loss of Consciousness     HPI Pt woke up this morning.  He was not feeling well and felt like his crohns was flaring up.  He had bloody diarrhea.   He went to the store thought and was shopping. He started to feel weak, lightheaded and thought he was going to pass out.  The next thing he remembers was EMS arriving.  At that point he knew he was in the store.   EMS was told he may have had shaking and seizure like activity.  He denies andy neck pain, he did sustain a cut to the back of his head.  No chest pain or abdominal pain.  No shortness of breath. Past Medical History  Diagnosis Date  . Crohn's disease of small and large intestines ([place])   . Hypercholesterolemia   . History of alcohol abuse   . History of substance abuse   . Depression   . 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 Primary sclerosing cholangitis     ? hepatitis overlap - liver bx x 2 and MRCP  . Allergy     SEASONAL  . Anxiety   . Cataract     BILATERAL/REMOVED  . Hypertension   . Personal history of adenomatous colonic polyps 12/2010, 03/2012    12/2010 - 8 mm serrated adenoma of rectum  . Chronic mesenteric ischemia ([place])   . Basal cell carcinoma of lower extremity 06/2011    on left leg  . Dysplastic nevus 06/2011    on right back.   . Suicide and self-inflicted poisoning by other sedatives and hypnotics 05/18/2012  . Major depressive disorder, recurrent episode, severe, without mention of psychotic behavior 04/12/2012  . C. difficile diarrhea 10/23/2012  . Autoimmune hepatitis ([place]) 01/19/2013  . Osteopenia   . Anemia   . Grover's disease    Past Surgical History  Procedure Laterality Date  . Cataract extraction, bilateral    . Pylonidal cyst removal    . Foot surgery      right  . Percutaneous liver biopsy  2007 and 2008  . Colonoscopy   2001, 05/02/2003, 01/28/11    2012: Right colon Crohn's, rectal polyp  . Esophagogastroduodenoscopy  01/28/11    Normal  . Colonoscopy  03/31/2012    Procedure: COLONOSCOPY;  Surgeon: Jerene Bears, MD;  [facility]COPY;  Service: Gastroenterology;  Laterality: N/A;  . Mohs surgery Left 11/2013    left ankle parakerotosis removal by dermatologist  . Abdominal aortagram N/A 03/29/2012    Procedure: ABDOMINAL Maxcine Ham;  Surgeon: Serafina Mitchell, MD;  Location: [facility] CATH LAB;  Service: Cardiovascular;  Laterality: N/A;   Family History  Problem Relation Age of Onset  . Heart disease Father   . Thyroid cancer Father   . Breast cancer Mother   . Stomach cancer Mother   . Colon cancer Neg Hx    Social History  Substance Use Topics  . Smoking status: Former Smoker    Types: Cigarettes    Quit date: 03/28/1997  . Smokeless tobacco: Never Used  . Alcohol Use: No    Review of Systems  All other systems reviewed and are negative.     Allergies  Asacol; Azathioprine; Gluten meal; Mycophenolate mofetil; Other; Wheat bran; and  Tape  Home Medications   Prior to Admission medications   Medication Sig Start Date End Date Taking? Authorizing Provider  ascorbic acid (VITAMIN C) 1000 MG tablet Take 2,000 mg by mouth daily.     Historical Provider, MD  Calcium Carb-Cholecalciferol (CALCIUM 1000 + D) 1000-800 MG-UNIT TABS Take by mouth. Takes 2 (2027m) tabs bid    Historical Provider, MD  cholecalciferol (VITAMIN D) 1000 UNITS tablet Take 4,000 Units by mouth 2 (two) times daily.     Historical Provider, MD  clonazePAM (KLONOPIN) 0.5 MG tablet Take 1 tablet (0.5 mg total) by mouth at bedtime as needed (insomnia). 11/28/14   CGatha Mayer MD  diltiazem 2 % GEL Apply a pea size amount into rectum 2 times a day 08/12/14   CGatha Mayer MD  fluticasone ([facility] 50 MCG/ACT nasal spray Place 2 sprays into the nose daily. Each nostril 08/11/12   Historical Provider, MD  HUMIRA PEN 40 MG/0.8ML  PNKT INJECT 40MG INTO THE SKIN EVERY 14 DAYS AFTER COMPLETION OF STARTER KIT 03/18/15   CGatha Mayer MD  HYDROcodone-acetaminophen (NORCO/VICODIN) 5-325 MG tablet Take 1 tablet by mouth every 6 (six) hours as needed for moderate pain. 02/19/15   CGatha Mayer MD  hydrocortisone (ANUSOL-HC) 2.5 % rectal cream Place 1 application rectally 2 (two) times daily as needed for hemorrhoids or itching. 01/15/15   CGatha Mayer MD  hydrOXYzine (ATARAX/VISTARIL) 25 MG tablet Take 25 mg by mouth at bedtime.  10/30/12   Historical Provider, MD  metroNIDAZOLE (FLAGYL) 500 MG tablet Take 1 tablet (500 mg total) by mouth 3 (three) times daily. 02/19/15   CGatha Mayer MD  mirtazapine (REMERON) 30 MG tablet Take 30 mg by mouth at bedtime.    Historical Provider, MD  mupirocin ointment (BACTROBAN) 2 %  12/06/13   Historical Provider, MD  predniSONE (DELTASONE) 20 MG tablet Take 25 mg (1.25tabs) daily and per Dr. GCelesta Averinstructions later Patient taking differently: 30 mg. Take 25 mg (1.25tabs) daily and per Dr. GCelesta Averinstructions later 01/02/15   CGatha Mayer MD  vitamin A 25000 UNIT capsule Take 25,000 Units by mouth daily.    Historical Provider, MD   BP 132/86 mmHg  Pulse 97  Temp(Src) 98 F (36.7 C) (Oral)  Resp 20  Ht 6' 1" (1.854 m)  Wt 97.523 kg  BMI 28.37 kg/m2  SpO2 100% Physical Exam  Constitutional: No distress.  HENT:  Head: Normocephalic.  Right Ear: External ear normal.  Left Ear: External ear normal.  Small abrasion to the posterior scalp  Eyes: Conjunctivae are normal. Right eye exhibits no discharge. Left eye exhibits no discharge. No scleral icterus.  Neck: Neck supple. No tracheal deviation present.  Cardiovascular: Normal rate, regular rhythm and intact distal pulses.   Pulmonary/Chest: Effort normal and breath sounds normal. No stridor. No respiratory distress. He has no wheezes. He has no rales.  Abdominal: Soft. Bowel sounds are normal. He exhibits no distension.  There is no tenderness. There is no rebound and no guarding.  Musculoskeletal: He exhibits no edema or tenderness.  Neurological: He is alert. He has normal strength. No cranial nerve deficit (no facial droop, extraocular movements intact, no slurred speech) or sensory deficit. He exhibits normal muscle tone. He displays no seizure activity. Coordination normal.  Skin: Skin is warm and dry. No rash noted.  Psychiatric: He has a normal mood and affect.  Nursing note and vitals reviewed.   ED Course  Procedures (including  critical care time) Labs Review Labs Reviewed  CBC WITH DIFFERENTIAL/PLATELET - Abnormal; Notable for the following:    WBC 15.0 (*)    Hemoglobin 9.7 (*)    HCT 33.0 (*)    MCV 73.3 (*)    MCH 21.6 (*)    MCHC 29.4 (*)    RDW 18.9 (*)    Neutro Abs 11.3 (*)    Monocytes Absolute 1.4 (*)    All other components within normal limits  BASIC METABOLIC PANEL - Abnormal; Notable for the following:    Potassium 3.0 (*)    Calcium 8.1 (*)    All other components within normal limits  CBG MONITORING, ED - Abnormal; Notable for the following:    Glucose-Capillary 140 (*)    All other components within normal limits  I-STAT TROPOININ, ED  POC OCCULT BLOOD, ED  TYPE AND SCREEN  ABO/RH    Imaging Review Ct Head Wo Contrast  03/21/2015  CLINICAL DATA:  Fall after dizziness and syncope, hitting side of head. EXAM: CT HEAD WITHOUT CONTRAST CT CERVICAL SPINE WITHOUT CONTRAST TECHNIQUE: Multidetector CT imaging of the head and cervical spine was performed following the standard protocol without intravenous contrast. Multiplanar CT image reconstructions of the cervical spine were also generated. COMPARISON:  None. FINDINGS: CT HEAD FINDINGS The ventricles are normal in size and configuration. The left lateral ventricle is slightly larger than the right lateral ventricle, an anatomic variant. There is no demonstrable mass, hemorrhage, extra-axial fluid collection, or midline shift.  There is subtle decreased attenuation in the peripheral left mid parietal lobe, felt to be indicative age uncertain infarct. A recent infarct in this area cannot be excluded. Elsewhere gray-white compartments appear normal. Bony calvarium appears intact. Mastoid air cells on the left are clear. There is opacification of several inferior mastoid air cells on the right. No intraorbital lesions are identified. CT CERVICAL SPINE FINDINGS There is no fracture or spondylolisthesis. Prevertebral soft tissues and predental space regions are normal. There is moderate disc space narrowing at C7-T1 and T1-2. Other disc spaces appear normal. There is mild facet hypertrophy at several levels. No disc extrusion or stenosis is apparent. There are foci of carotid artery calcification bilaterally. There is also atherosclerotic calcification in the aortic arch region. IMPRESSION: CT head: Age uncertain and possibly recent infarct in the periphery of the left parietal lobe, best seen on axial slice 23 series 979. Elsewhere gray-white compartments appear normal. No hemorrhage or mass effect. There is opacification of several inferior mastoid air cells on the right without air-fluid level. Mastoids on the left are clear. CT head: No demonstrable fracture or spondylolisthesis. Areas of relatively mild osteoarthritic change. Foci of bilateral carotid artery calcification. Electronically Signed   By: Lowella Grip III M.D.   On: 03/21/2015 13:27   Ct Cervical Spine Wo Contrast  03/21/2015  CLINICAL DATA:  Fall after dizziness and syncope, hitting side of head. EXAM: CT HEAD WITHOUT CONTRAST CT CERVICAL SPINE WITHOUT CONTRAST TECHNIQUE: Multidetector CT imaging of the head and cervical spine was performed following the standard protocol without intravenous contrast. Multiplanar CT image reconstructions of the cervical spine were also generated. COMPARISON:  None. FINDINGS: CT HEAD FINDINGS The ventricles are normal in size and  configuration. The left lateral ventricle is slightly larger than the right lateral ventricle, an anatomic variant. There is no demonstrable mass, hemorrhage, extra-axial fluid collection, or midline shift. There is subtle decreased attenuation in the peripheral left mid parietal lobe, felt to be indicative  age uncertain infarct. A recent infarct in this area cannot be excluded. Elsewhere gray-white compartments appear normal. Bony calvarium appears intact. Mastoid air cells on the left are clear. There is opacification of several inferior mastoid air cells on the right. No intraorbital lesions are identified. CT CERVICAL SPINE FINDINGS There is no fracture or spondylolisthesis. Prevertebral soft tissues and predental space regions are normal. There is moderate disc space narrowing at C7-T1 and T1-2. Other disc spaces appear normal. There is mild facet hypertrophy at several levels. No disc extrusion or stenosis is apparent. There are foci of carotid artery calcification bilaterally. There is also atherosclerotic calcification in the aortic arch region. IMPRESSION: CT head: Age uncertain and possibly recent infarct in the periphery of the left parietal lobe, best seen on axial slice 23 series 701. Elsewhere gray-white compartments appear normal. No hemorrhage or mass effect. There is opacification of several inferior mastoid air cells on the right without air-fluid level. Mastoids on the left are clear. CT head: No demonstrable fracture or spondylolisthesis. Areas of relatively mild osteoarthritic change. Foci of bilateral carotid artery calcification. Electronically Signed   By: Lowella Grip III M.D.   On: 03/21/2015 13:27   I have personally reviewed and evaluated these images and lab results as part of my medical decision-making.   EKG Interpretation   Date/Time:  Friday March 21 2015 11:39:43 EST Ventricular Rate:  105 PR Interval:  140 QRS Duration: 88 QT Interval:  337 QTC Calculation:  445 R Axis:   20 Text Interpretation:  Sinus tachycardia Abnormal R-wave progression, early  transition No significant change since last tracing Confirmed by Patricio Popwell   MD-J, Cystal Shannahan ([id number]) on 03/21/2015 11:51:58 AM      MDM   Final diagnoses:  Acute blood loss anemia  Crohn's colitis, with rectal bleeding ([place])    Patient's laboratory tests show a significant drop in his hemoglobin from last year. I suspect this is the cause of the patient's syncopal episode. He admits that he has had recent rectal bleeding associated with his Crohn's disease. I recommended the patient be admitted to the hospital for observation and monitoring considering his blood loss.  Patient is emphatic that he does not want to be admitted to the hospital.  Patient states he knows why this happened. He ate some salmon that he should not have eaten. Patient states that he does not think he will have any further problems. It was just related to the food and he we'll monitor his oral intake. He understands that he can return to the hospital at any time if he changes his mind and wants to be admitted. He'll be leaving the hospital AMA. Patient's mother and father are with himand they plan on watching him as well.    Dorie Rank, MD 03/21/15 [phone]

## 2015-03-24 ENCOUNTER — Telehealth: Payer: Self-pay

## 2015-03-24 ENCOUNTER — Encounter: Payer: Self-pay | Admitting: Internal Medicine

## 2015-03-24 ENCOUNTER — Other Ambulatory Visit (INDEPENDENT_AMBULATORY_CARE_PROVIDER_SITE_OTHER): Payer: Medicare Other

## 2015-03-24 DIAGNOSIS — K50119 Crohn's disease of large intestine with unspecified complications: Secondary | ICD-10-CM

## 2015-03-24 LAB — COMPREHENSIVE METABOLIC PANEL
ALK PHOS: 70 U/L (ref 39–117)
ALT: 38 U/L (ref 0–53)
AST: 26 U/L (ref 0–37)
Albumin: 3.3 g/dL — ABNORMAL LOW (ref 3.5–5.2)
BUN: 15 mg/dL (ref 6–23)
CHLORIDE: 101 meq/L (ref 96–112)
CO2: 30 meq/L (ref 19–32)
Calcium: 8.6 mg/dL (ref 8.4–10.5)
Creatinine, Ser: 1.25 mg/dL (ref 0.40–1.50)
GFR: 64.5 mL/min (ref >60.00)
GLUCOSE: 84 mg/dL (ref 70–99)
POTASSIUM: 3.8 meq/L (ref 3.5–5.1)
SODIUM: 138 meq/L (ref 135–145)
TOTAL PROTEIN: 6.2 g/dL (ref 6.0–8.3)
Total Bilirubin: 0.5 mg/dL (ref 0.2–1.2)

## 2015-03-24 LAB — CBC WITH DIFFERENTIAL/PLATELET
Basophils Absolute: 0 10*3/uL (ref 0.0–0.1)
Basophils Relative: 0.2 % (ref 0.0–3.0)
EOS PCT: 0.6 % (ref 0.0–5.0)
Eosinophils Absolute: 0.1 10*3/uL (ref 0.0–0.7)
HCT: 35.4 % — ABNORMAL LOW (ref 39.0–52.0)
Hemoglobin: 10.7 g/dL — ABNORMAL LOW (ref 13.0–17.0)
LYMPHS ABS: 3.2 10*3/uL (ref 0.7–4.0)
Lymphocytes Relative: 23.2 % (ref 12.0–46.0)
MCHC: 30.4 g/dL (ref 30.0–36.0)
MCV: 71.1 fl — ABNORMAL LOW (ref 78.0–100.0)
MONO ABS: 1.7 10*3/uL — AB (ref 0.1–1.0)
Monocytes Relative: 12.7 % — ABNORMAL HIGH (ref 3.0–12.0)
NEUTROS PCT: 63.3 % (ref 43.0–77.0)
Neutro Abs: 8.6 10*3/uL — ABNORMAL HIGH (ref 1.4–7.7)
PLATELETS: 377 10*3/uL (ref 150.0–400.0)
RBC: 4.97 Mil/uL (ref 4.22–5.81)
RDW: 20.7 % — ABNORMAL HIGH (ref 11.5–15.5)
WBC: 13.6 10*3/uL — ABNORMAL HIGH (ref 4.0–10.5)

## 2015-03-24 NOTE — Telephone Encounter (Signed)
-----   Message from [name], MD sent at 03/21/2015 10:04 PM EST ----- Regarding: needs cbc, f/u Went to ED w/ syncope 1/20  Hgb down K low   Call him and recheck CBC, CMET Also going to need f/u me

## 2015-03-24 NOTE — Telephone Encounter (Signed)
Patient notified he will come today or tomorrow for lab work He was already scheduled for follow up on 03/28/15.  He is reminded to keep that appt

## 2015-03-25 ENCOUNTER — Telehealth: Payer: Self-pay

## 2015-03-25 ENCOUNTER — Other Ambulatory Visit: Payer: Self-pay

## 2015-03-25 ENCOUNTER — Encounter: Payer: Self-pay | Admitting: Internal Medicine

## 2015-03-25 DIAGNOSIS — K50119 Crohn's disease of large intestine with unspecified complications: Secondary | ICD-10-CM

## 2015-03-25 DIAGNOSIS — K8301 Primary sclerosing cholangitis: Secondary | ICD-10-CM

## 2015-03-25 DIAGNOSIS — D509 Iron deficiency anemia, unspecified: Secondary | ICD-10-CM

## 2015-03-25 DIAGNOSIS — K754 Autoimmune hepatitis: Secondary | ICD-10-CM

## 2015-03-25 MED ORDER — METRONIDAZOLE 500 MG PO TABS: 500.0000 mg | ORAL_TABLET | Freq: Three times a day (TID) | ORAL | Status: DC

## 2015-03-25 MED ORDER — CIPROFLOXACIN HCL 500 MG PO TABS: 500.0000 mg | ORAL_TABLET | Freq: Two times a day (BID) | ORAL | Status: DC

## 2015-03-25 NOTE — Telephone Encounter (Signed)
cipro 500 bid x 10 d

## 2015-03-25 NOTE — Progress Notes (Signed)
Quick Note:  Please arrange feraheme injections for iron def anemia Also needs CT abd/pelvis with contrast re: Crohn's disease, PSC/autoimmune hepatitis ______

## 2015-03-25 NOTE — Telephone Encounter (Signed)
Patient is scheduled for CT for 03/27/15 9:00 at [place] #1 scheduled for 03/31/15 8:00 and #2 04/07/15 8:00 both at [facility].   See additional MY Chat message from patient. Requesting antibiotics.  Discussed with [name] Flagyl 500 mg TID for 10 days.  Left message for patient to call back or to reply through My Chart.

## 2015-03-25 NOTE — Progress Notes (Signed)
Quick Note:  [name] said his appointments were "too early" If not addressed please f/u w/ him on this ______

## 2015-03-25 NOTE — Telephone Encounter (Signed)
Patient returned call.  He would like a different antibiotic.  He already has flagyl.  He feels he needs a broad spectrum antibiotic.  Please advise

## 2015-03-25 NOTE — Telephone Encounter (Signed)
Patient notified

## 2015-03-25 NOTE — Telephone Encounter (Signed)
-----   Message from [name], MD sent at 03/25/2015 11:46 AM EST ----- Please arrange feraheme injections for iron def anemia Also needs CT abd/pelvis with contrast re: Crohn's disease, PSC/autoimmune hepatitis

## 2015-03-26 ENCOUNTER — Ambulatory Visit (INDEPENDENT_AMBULATORY_CARE_PROVIDER_SITE_OTHER)
Admission: RE | Admit: 2015-03-26 | Discharge: 2015-03-26 | Disposition: A | Discharge status: Home / Self Care | Payer: Medicare Other | Source: Ambulatory Visit | Attending: Internal Medicine | Admitting: Internal Medicine

## 2015-03-26 DIAGNOSIS — K754 Autoimmune hepatitis: Secondary | ICD-10-CM | POA: Diagnosis not present

## 2015-03-26 DIAGNOSIS — K8301 Primary sclerosing cholangitis: Secondary | ICD-10-CM

## 2015-03-26 DIAGNOSIS — K830 Cholangitis: Secondary | ICD-10-CM | POA: Diagnosis not present

## 2015-03-26 DIAGNOSIS — K50919 Crohn's disease, unspecified, with unspecified complications: Secondary | ICD-10-CM | POA: Diagnosis not present

## 2015-03-26 DIAGNOSIS — K50119 Crohn's disease of large intestine with unspecified complications: Secondary | ICD-10-CM | POA: Diagnosis not present

## 2015-03-26 MED ORDER — IOHEXOL 300 MG/ML  SOLN
100.0000 mL | Freq: Once | INTRAMUSCULAR | Status: AC | PRN
  Administered 2015-03-26: 100 mL via INTRAVENOUS

## 2015-03-26 NOTE — Progress Notes (Signed)
Quick Note:  PVT - hypoperfusion in that area of liver May need anti-coagulation Will discuss No active Crohn's ______

## 2015-03-27 ENCOUNTER — Inpatient Hospital Stay: Admission: RE | Admit: 2015-03-27 | Payer: Self-pay | Source: Ambulatory Visit

## 2015-03-27 ENCOUNTER — Encounter: Payer: Self-pay | Admitting: Internal Medicine

## 2015-03-28 ENCOUNTER — Other Ambulatory Visit: Payer: Self-pay | Admitting: Internal Medicine

## 2015-03-28 ENCOUNTER — Encounter: Payer: Self-pay | Admitting: Internal Medicine

## 2015-03-28 ENCOUNTER — Other Ambulatory Visit (INDEPENDENT_AMBULATORY_CARE_PROVIDER_SITE_OTHER): Payer: Medicare Other

## 2015-03-28 ENCOUNTER — Ambulatory Visit (INDEPENDENT_AMBULATORY_CARE_PROVIDER_SITE_OTHER): Payer: Medicare Other | Admitting: Internal Medicine

## 2015-03-28 VITALS — BP 126/84 | HR 68 | Ht 73.25 in | Wt 211.1 lb

## 2015-03-28 DIAGNOSIS — I81 Portal vein thrombosis: Secondary | ICD-10-CM | POA: Diagnosis not present

## 2015-03-28 DIAGNOSIS — D500 Iron deficiency anemia secondary to blood loss (chronic): Secondary | ICD-10-CM | POA: Diagnosis not present

## 2015-03-28 DIAGNOSIS — F332 Major depressive disorder, recurrent severe without psychotic features: Secondary | ICD-10-CM

## 2015-03-28 DIAGNOSIS — K50819 Crohn's disease of both small and large intestine with unspecified complications: Secondary | ICD-10-CM | POA: Diagnosis not present

## 2015-03-28 DIAGNOSIS — K754 Autoimmune hepatitis: Secondary | ICD-10-CM

## 2015-03-28 LAB — PROTIME-INR
INR: 1.2 ratio — AB (ref 0.8–1.0)
Prothrombin Time: 12.5 s (ref 9.6–13.1)

## 2015-03-28 MED ORDER — WARFARIN SODIUM 5 MG PO TABS: 5.0000 mg | ORAL_TABLET | Freq: Every day | ORAL | Status: DC

## 2015-03-28 NOTE — Assessment & Plan Note (Signed)
Feraheme

## 2015-03-28 NOTE — Patient Instructions (Addendum)
   Please call Dr [name] to set up an appointment for next week.   Your physician has requested that you go to the basement for the following lab work before leaving today: PT/INR   Follow up with Dr Carlean Purl in 6-8 weeks, apptointment made for 05/26/15 at 3:15pm    I appreciate the opportunity to care for you. Silvano Rusk, MD, [facility]

## 2015-03-28 NOTE — Assessment & Plan Note (Signed)
Continue prednisone and Humira

## 2015-03-28 NOTE — Assessment & Plan Note (Addendum)
Right portal vein thrombosis looks new. Given the changes of hypoperfusion in the right lobe of the liver I think it is worthwhile anticoagulating. I have discussed this with his primary care physician, Dr. [name]. We'll start 5 mg warfarin and Dr. [name] as kindly agreed to enroll him in anticoagulation clinic at [place] [facility] there. [name] will call for an appointment to be seen there next week. We'll look into this further, anticipate imaging follow-up at some point in the next few months.

## 2015-03-28 NOTE — Progress Notes (Signed)
Subjective:    Patient ID: [name], male    DOB: 03/14/1965, 50 y.o.   MRN: [id number] Chief complaint: Follow-up of Crohn's, abdominal discomfort HPI [name] is here for follow-up. He has been feeling pretty bad lately. Discounted generalized aches and feeling unwell but he also has some right upper quadrant discomfort and he senses borborygmi there. Intermittent diarrhea. He bent over when he was in his stool or outside by the car and actually had a syncopal episode last week. He declined a significant evaluation in the ER they wanted to admit him for observation. His hemoglobin was down and he is microcytic again so I ordered feraheme. I had him do a CT scan. It showed changes of cirrhosis, and also what is a relatively new clot in the right portal vein with signs of hypoperfusion in the right liver. It did not show changes suggesting active Crohn's disease. I had also started him on Cipro as adjunct of treatment for his Crohn's. He thinks that is helping. He does have fatigue. Has not felt like he will pass out again. Allergies  Allergen Reactions  . Asacol [Mesalamine] Diarrhea    abd pain  . Azathioprine Diarrhea    abd pain  . Gluten Meal Diarrhea  . Mycophenolate Mofetil Diarrhea    abd pain  . Other     NUTS; constipation  . Wheat Bran Diarrhea    Constipation; flatulence; abd pain  . Tape Itching and Rash    Please use "paper" tape   Outpatient Prescriptions Prior to Visit  Medication Sig Dispense Refill  . ascorbic acid (VITAMIN C) 1000 MG tablet Take 2,000 mg by mouth daily.     . Calcium Carb-Cholecalciferol (CALCIUM 1000 + D) 1000-800 MG-UNIT TABS Take by mouth. Takes 2 (2078m) tabs bid    . cholecalciferol (VITAMIN D) 1000 UNITS tablet Take 4,000 Units by mouth 2 (two) times daily.     . ciprofloxacin (CIPRO) 500 MG tablet Take 1 tablet (500 mg total) by mouth 2 (two) times daily. 20 tablet 0  . clonazePAM (KLONOPIN) 0.5 MG tablet Take 1 tablet (0.5 mg  total) by mouth at bedtime as needed (insomnia). 20 tablet 0  . diltiazem 2 % GEL Apply a pea size amount into rectum 2 times a day 30 g 2  . fluticasone (FLONASE) 50 MCG/ACT nasal spray Place 2 sprays into the nose daily. Each nostril    . HUMIRA PEN 40 MG/0.8ML PNKT INJECT 40MG INTO THE SKIN EVERY 14 DAYS AFTER COMPLETION OF STARTER KIT 2 each 6  . HYDROcodone-acetaminophen (NORCO/VICODIN) 5-325 MG tablet Take 1 tablet by mouth every 6 (six) hours as needed for moderate pain. 45 tablet 0  . hydrocortisone (ANUSOL-HC) 2.5 % rectal cream Place 1 application rectally 2 (two) times daily as needed for hemorrhoids or itching. 30 g 1  . hydrOXYzine (ATARAX/VISTARIL) 25 MG tablet Take 25 mg by mouth at bedtime.     . metroNIDAZOLE (FLAGYL) 500 MG tablet Take 1 tablet (500 mg total) by mouth 3 (three) times daily. 42 tablet 0  . mirtazapine (REMERON) 30 MG tablet Take 30 mg by mouth at bedtime.    . mupirocin ointment (BACTROBAN) 2 %     . predniSONE (DELTASONE) 20 MG tablet Take 25 mg (1.25tabs) daily and per Dr. GCelesta Averinstructions later (Patient taking differently: 30 mg. Take 25 mg (1.25tabs) daily and per Dr. GCelesta Averinstructions later) 100 tablet 1  . vitamin A 25000 UNIT capsule Take 25,000 Units  by mouth daily.    . metroNIDAZOLE (FLAGYL) 500 MG tablet Take 1 tablet (500 mg total) by mouth 3 (three) times daily. 30 tablet 0   No facility-administered medications prior to visit.   Past Medical History  Diagnosis Date  . Crohn's disease of small and large intestines ([place])   . Hypercholesterolemia   . History of alcohol abuse   . History of substance abuse   . Depression   . 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 Primary sclerosing cholangitis     ? hepatitis overlap - liver bx x 2 and MRCP  . Allergy     SEASONAL  . Anxiety   . Cataract     BILATERAL/REMOVED  . Hypertension   . Personal history of adenomatous colonic polyps 12/2010, 03/2012    12/2010 - 8 mm serrated adenoma  of rectum  . Chronic mesenteric ischemia ([place])   . Basal cell carcinoma of lower extremity 06/2011    on left leg  . Dysplastic nevus 06/2011    on right back.   . Suicide and self-inflicted poisoning by other sedatives and hypnotics 05/18/2012  . Major depressive disorder, recurrent episode, severe, without mention of psychotic behavior 04/12/2012  . C. difficile diarrhea 10/23/2012  . Autoimmune hepatitis ([place]) 01/19/2013  . Osteopenia   . Anemia   . Grover's disease    Past Surgical History  Procedure Laterality Date  . Cataract extraction, bilateral    . Pylonidal cyst removal    . Foot surgery      right  . Percutaneous liver biopsy  2007 and 2008  . Colonoscopy  2001, 05/02/2003, 01/28/11    2012: Right colon Crohn's, rectal polyp  . Esophagogastroduodenoscopy  01/28/11    Normal  . Colonoscopy  03/31/2012    Procedure: COLONOSCOPY;  Surgeon: Jerene Bears, MD;  Location: [facility] ENDOSCOPY;  Service: Gastroenterology;  Laterality: N/A;  . Mohs surgery Left 11/2013    left ankle parakerotosis removal by dermatologist  . Abdominal aortagram N/A 03/29/2012    Procedure: ABDOMINAL Maxcine Ham;  Surgeon: Serafina Mitchell, MD;  Location: [facility] CATH LAB;  Service: Cardiovascular;  Laterality: N/A;   Social History   Social History  . Marital Status: Single    Spouse Name: N/A  . Number of Children: 1  . Years of Education: N/A   Occupational History  . Disability     Social History Main Topics  . Smoking status: Former Smoker    Types: Cigarettes    Quit date: 03/28/1997  . Smokeless tobacco: Never Used  . Alcohol Use: No  . Drug Use: No     Comment: QUIT USING DRUGS IN 1997  . Sexual Activity: Not Asked   Other Topics Concern  . None   Social History Narrative   Family History  Problem Relation Age of Onset  . Heart disease Father   . Thyroid cancer Father   . Breast cancer Mother   . Stomach cancer Mother   . Colon cancer Neg Hx        Review of Systems As above. He  remains depressed, a little worse now he thinks that he is not suicidal or homicidal. All other review of systems are negative at this time.    Objective:   Physical Exam @BP  126/84 mmHg  Pulse 68  Ht 6' 1.25" (1.861 m)  Wt 211 lb 1.6 oz (95.754 kg)  BMI 27.65 kg/m2@  General:  NAD, mild to moderately  cushingoid Eyes:   anicteric Lungs:  clear Heart:: S1S2 no rubs, murmurs or gallops Abdomen:  soft and nontender, BS+ no hepatosplenomegaly or mass Ext:   no edema, cyanosis or clubbing Mood:  Mildly flat. Not homicidal or suicidal. Skin:  Persistent erythematous follicular rash on the anterior trunk   Data Reviewed:  As per history of present illness.   Chemistry      Component Value Date/Time   NA 138 03/24/2015 1237   K 3.8 03/24/2015 1237   CL 101 03/24/2015 1237   CO2 30 03/24/2015 1237   BUN 15 03/24/2015 1237   CREATININE 1.25 03/24/2015 1237      Component Value Date/Time   CALCIUM 8.6 03/24/2015 1237   ALKPHOS 70 03/24/2015 1237   AST 26 03/24/2015 1237   ALT 38 03/24/2015 1237   BILITOT 0.5 03/24/2015 1237            Assessment & Plan:  CROHN'S DISEASE, LARGE AND SMALL INTESTINES Continue prednisone and Humira   Autoimmune hepatitis-PSC overlap LFT's NL  Anemia due to chronic blood loss Feraheme  Depression Mildly worse - not suicidal/homicidal  Major depressive disorder, recurrent episode, severe Slightly worse but not suicidal  Portal vein thrombosis Right portal vein thrombosis looks new. Given the changes of hypoperfusion in the right lobe of the liver I think it is worthwhile anticoagulating. I have discussed this with his primary care physician, Dr. [name]. We'll start 5 mg warfarin and Dr. [name] as kindly agreed to enroll him in anticoagulation clinic at [place] [facility] there. [name] will call for an appointment to be seen there next week. We'll look into this further, anticipate imaging follow-up at some point in the next few  months.   Lab Results  Component Value Date   INR 1.2* 03/28/2015   INR 1.11 01/11/2013   INR 1.06 03/25/2012     I will see [name] again in about 2 months sooner if needed.  CC:  Melinda Crutch, MD

## 2015-03-28 NOTE — Assessment & Plan Note (Signed)
LFT's NL

## 2015-03-28 NOTE — Assessment & Plan Note (Signed)
Mildly worse - not suicidal/homicidal

## 2015-03-28 NOTE — Assessment & Plan Note (Signed)
Slightly worse but not suicidal

## 2015-03-31 ENCOUNTER — Encounter (HOSPITAL_COMMUNITY)
Admission: RE | Admit: 2015-03-31 | Discharge: 2015-03-31 | Disposition: A | Discharge status: Home / Self Care | Payer: Medicare Other | Source: Ambulatory Visit | Attending: Internal Medicine | Admitting: Internal Medicine

## 2015-03-31 VITALS — BP 128/78 | HR 72 | Temp 98.0°F | Resp 20 | Ht 73.0 in | Wt 210.0 lb

## 2015-03-31 DIAGNOSIS — D509 Iron deficiency anemia, unspecified: Secondary | ICD-10-CM

## 2015-03-31 MED ORDER — SODIUM CHLORIDE 0.9 % IV SOLN
510.0000 mg | INTRAVENOUS | Status: DC
  Administered 2015-03-31: 510 mg via INTRAVENOUS
  Filled 2015-03-31: qty 17

## 2015-04-04 ENCOUNTER — Other Ambulatory Visit (HOSPITAL_COMMUNITY): Payer: Self-pay | Admitting: *Deleted

## 2015-04-04 DIAGNOSIS — I81 Portal vein thrombosis: Secondary | ICD-10-CM | POA: Diagnosis not present

## 2015-04-04 DIAGNOSIS — R21 Rash and other nonspecific skin eruption: Secondary | ICD-10-CM | POA: Diagnosis not present

## 2015-04-04 DIAGNOSIS — R197 Diarrhea, unspecified: Secondary | ICD-10-CM | POA: Diagnosis not present

## 2015-04-04 DIAGNOSIS — Z7901 Long term (current) use of anticoagulants: Secondary | ICD-10-CM | POA: Diagnosis not present

## 2015-04-07 ENCOUNTER — Encounter (HOSPITAL_COMMUNITY)
Admission: RE | Admit: 2015-04-07 | Discharge: 2015-04-07 | Disposition: A | Discharge status: Home / Self Care | Payer: Medicare Other | Source: Ambulatory Visit | Attending: Internal Medicine | Admitting: Internal Medicine

## 2015-04-07 VITALS — BP 128/78 | HR 74 | Temp 98.2°F | Resp 20 | Ht 73.0 in | Wt 215.0 lb

## 2015-04-07 DIAGNOSIS — D509 Iron deficiency anemia, unspecified: Secondary | ICD-10-CM

## 2015-04-07 DIAGNOSIS — R197 Diarrhea, unspecified: Secondary | ICD-10-CM | POA: Diagnosis not present

## 2015-04-07 MED ORDER — SODIUM CHLORIDE 0.9 % IV SOLN
510.0000 mg | INTRAVENOUS | Status: DC
  Administered 2015-04-07: 510 mg via INTRAVENOUS
  Filled 2015-04-07: qty 17

## 2015-04-09 DIAGNOSIS — Z7901 Long term (current) use of anticoagulants: Secondary | ICD-10-CM | POA: Diagnosis not present

## 2015-04-09 DIAGNOSIS — R197 Diarrhea, unspecified: Secondary | ICD-10-CM | POA: Diagnosis not present

## 2015-04-09 DIAGNOSIS — I81 Portal vein thrombosis: Secondary | ICD-10-CM | POA: Diagnosis not present

## 2015-04-15 ENCOUNTER — Encounter: Payer: Self-pay | Admitting: Internal Medicine

## 2015-04-16 ENCOUNTER — Other Ambulatory Visit: Payer: Self-pay | Admitting: Internal Medicine

## 2015-04-16 DIAGNOSIS — K50819 Crohn's disease of both small and large intestine with unspecified complications: Secondary | ICD-10-CM

## 2015-04-16 DIAGNOSIS — K602 Anal fissure, unspecified: Secondary | ICD-10-CM

## 2015-04-16 MED ORDER — HYDROCODONE-ACETAMINOPHEN 5-325 MG PO TABS: 1.0000 | ORAL_TABLET | Freq: Four times a day (QID) | ORAL | Status: DC | PRN

## 2015-05-07 DIAGNOSIS — K50913 Crohn's disease, unspecified, with fistula: Secondary | ICD-10-CM | POA: Diagnosis not present

## 2015-05-07 DIAGNOSIS — Z7901 Long term (current) use of anticoagulants: Secondary | ICD-10-CM | POA: Diagnosis not present

## 2015-05-07 DIAGNOSIS — K922 Gastrointestinal hemorrhage, unspecified: Secondary | ICD-10-CM | POA: Diagnosis not present

## 2015-05-07 DIAGNOSIS — I81 Portal vein thrombosis: Secondary | ICD-10-CM | POA: Diagnosis not present

## 2015-05-09 ENCOUNTER — Encounter: Payer: Self-pay | Admitting: Internal Medicine

## 2015-05-13 ENCOUNTER — Other Ambulatory Visit: Payer: Self-pay | Admitting: Internal Medicine

## 2015-05-13 ENCOUNTER — Encounter: Payer: Self-pay | Admitting: Internal Medicine

## 2015-05-13 DIAGNOSIS — D500 Iron deficiency anemia secondary to blood loss (chronic): Secondary | ICD-10-CM

## 2015-05-14 ENCOUNTER — Other Ambulatory Visit (INDEPENDENT_AMBULATORY_CARE_PROVIDER_SITE_OTHER): Payer: Medicare Other

## 2015-05-14 DIAGNOSIS — D500 Iron deficiency anemia secondary to blood loss (chronic): Secondary | ICD-10-CM

## 2015-05-14 LAB — CBC WITH DIFFERENTIAL/PLATELET
Basophils Absolute: 0 10*3/uL (ref 0.0–0.1)
Basophils Relative: 0.3 % (ref 0.0–3.0)
EOS PCT: 0.6 % (ref 0.0–5.0)
Eosinophils Absolute: 0.1 10*3/uL (ref 0.0–0.7)
HCT: 34.2 % — ABNORMAL LOW (ref 39.0–52.0)
HEMOGLOBIN: 10.7 g/dL — AB (ref 13.0–17.0)
LYMPHS PCT: 21.4 % (ref 12.0–46.0)
Lymphs Abs: 3.5 10*3/uL (ref 0.7–4.0)
MCHC: 31.3 g/dL (ref 30.0–36.0)
MCV: 77.3 fl — AB (ref 78.0–100.0)
MONO ABS: 1.8 10*3/uL — AB (ref 0.1–1.0)
MONOS PCT: 11.1 % (ref 3.0–12.0)
Neutro Abs: 10.9 10*3/uL — ABNORMAL HIGH (ref 1.4–7.7)
Neutrophils Relative %: 66.6 % (ref 43.0–77.0)
Platelets: 348 10*3/uL (ref 150.0–400.0)
RBC: 4.42 Mil/uL (ref 4.22–5.81)
RDW: 25.5 % — AB (ref 11.5–15.5)
WBC: 16.4 10*3/uL — AB (ref 4.0–10.5)

## 2015-05-14 LAB — FERRITIN: FERRITIN: 16 ng/mL — AB (ref 22.0–322.0)

## 2015-05-14 NOTE — Progress Notes (Signed)
Quick Note:  Still iron deficient  Needs repeat feraheme x 2 please ______

## 2015-05-15 ENCOUNTER — Encounter: Payer: Self-pay | Admitting: Internal Medicine

## 2015-05-15 ENCOUNTER — Other Ambulatory Visit: Payer: Self-pay

## 2015-05-15 DIAGNOSIS — D509 Iron deficiency anemia, unspecified: Secondary | ICD-10-CM

## 2015-05-20 ENCOUNTER — Other Ambulatory Visit (HOSPITAL_COMMUNITY): Payer: Self-pay | Admitting: *Deleted

## 2015-05-20 ENCOUNTER — Encounter: Payer: Self-pay | Admitting: Internal Medicine

## 2015-05-21 ENCOUNTER — Ambulatory Visit (HOSPITAL_COMMUNITY)
Admission: RE | Admit: 2015-05-21 | Discharge: 2015-05-21 | Disposition: A | Discharge status: Home / Self Care | Payer: Medicare Other | Source: Ambulatory Visit | Attending: Internal Medicine | Admitting: Internal Medicine

## 2015-05-21 VITALS — BP 122/76 | HR 77 | Resp 20

## 2015-05-21 DIAGNOSIS — D509 Iron deficiency anemia, unspecified: Secondary | ICD-10-CM | POA: Insufficient documentation

## 2015-05-21 MED ORDER — SODIUM CHLORIDE 0.9 % IV SOLN
510.0000 mg | INTRAVENOUS | Status: DC
  Administered 2015-05-21: 510 mg via INTRAVENOUS
  Filled 2015-05-21: qty 17

## 2015-05-22 MED ORDER — PREDNISONE 20 MG PO TABS: ORAL_TABLET | ORAL | Status: DC

## 2015-05-22 NOTE — Telephone Encounter (Signed)
Refill sent.

## 2015-05-26 ENCOUNTER — Ambulatory Visit (INDEPENDENT_AMBULATORY_CARE_PROVIDER_SITE_OTHER): Payer: Medicare Other | Admitting: Internal Medicine

## 2015-05-26 ENCOUNTER — Encounter: Payer: Self-pay | Admitting: Internal Medicine

## 2015-05-26 VITALS — BP 110/80 | HR 80 | Ht 73.25 in | Wt 206.0 lb

## 2015-05-26 DIAGNOSIS — D500 Iron deficiency anemia secondary to blood loss (chronic): Secondary | ICD-10-CM | POA: Diagnosis not present

## 2015-05-26 DIAGNOSIS — Z7952 Long term (current) use of systemic steroids: Secondary | ICD-10-CM

## 2015-05-26 DIAGNOSIS — K50818 Crohn's disease of both small and large intestine with other complication: Secondary | ICD-10-CM | POA: Diagnosis not present

## 2015-05-26 DIAGNOSIS — K754 Autoimmune hepatitis: Secondary | ICD-10-CM | POA: Diagnosis not present

## 2015-05-26 DIAGNOSIS — I81 Portal vein thrombosis: Secondary | ICD-10-CM | POA: Diagnosis not present

## 2015-05-26 NOTE — Assessment & Plan Note (Addendum)
Better Try to taper prednisone adalimumab levels and abs in 3 d trough

## 2015-05-26 NOTE — Progress Notes (Signed)
Subjective:    Patient ID: [name], male    DOB: 10-06-1965, 50 y.o.   MRN: [id number] Cc; f/u Crohn's, autoimmune hepatitis/PSC overlap, portal vein thrombosis HPI Doing fairly well today - stronger, better appetite and no diarrhea or rectal bleeding. On 25 mg prednisone daily + Humira Has had repeat feraheme for recurrent iron-deficiency Taking warfarin - Dr. [name] following that  Allergies  Allergen Reactions  . Asacol [Mesalamine] Diarrhea    abd pain  . Azathioprine Diarrhea    abd pain  . Gluten Meal Diarrhea  . Mycophenolate Mofetil Diarrhea    abd pain  . Other     NUTS; constipation  . Wheat Bran Diarrhea    Constipation; flatulence; abd pain  . Tape Itching and Rash    Please use "paper" tape   Outpatient Prescriptions Prior to Visit  Medication Sig Dispense Refill  . ascorbic acid (VITAMIN C) 1000 MG tablet Take 2,000 mg by mouth daily.     . Calcium Carb-Cholecalciferol (CALCIUM 1000 + D) 1000-800 MG-UNIT TABS Take by mouth. Takes 2 (2082m) tabs bid    . cholecalciferol (VITAMIN D) 1000 UNITS tablet Take 4,000 Units by mouth 2 (two) times daily.     . clonazePAM (KLONOPIN) 0.5 MG tablet Take 1 tablet (0.5 mg total) by mouth at bedtime as needed (insomnia). 20 tablet 0  . diltiazem 2 % GEL Apply a pea size amount into rectum 2 times a day 30 g 2  . fluticasone (FLONASE) 50 MCG/ACT nasal spray Place 2 sprays into the nose daily. Each nostril    . HUMIRA PEN 40 MG/0.8ML PNKT INJECT 40MG INTO THE SKIN EVERY 14 DAYS AFTER COMPLETION OF STARTER KIT 2 each 6  . HYDROcodone-acetaminophen (NORCO/VICODIN) 5-325 MG tablet Take 1 tablet by mouth every 6 (six) hours as needed for moderate pain. 45 tablet 0  . hydrocortisone (ANUSOL-HC) 2.5 % rectal cream Place 1 application rectally 2 (two) times daily as needed for hemorrhoids or itching. 30 g 1  . hydrOXYzine (ATARAX/VISTARIL) 25 MG tablet Take 25 mg by mouth at bedtime.     . mirtazapine (REMERON) 30 MG tablet  Take 30 mg by mouth at bedtime.    . mupirocin ointment (BACTROBAN) 2 %     . predniSONE (DELTASONE) 20 MG tablet Take 25 mg (1.25tabs) daily and per Dr. GCelesta Averinstructions later 100 tablet 1  . vitamin A 25000 UNIT capsule Take 25,000 Units by mouth daily.    .Marland Kitchenwarfarin (COUMADIN) 5 MG tablet Take 1 tablet (5 mg total) by mouth daily. And as directed 60 tablet 0  . ciprofloxacin (CIPRO) 500 MG tablet Take 1 tablet (500 mg total) by mouth 2 (two) times daily. 20 tablet 0  . metroNIDAZOLE (FLAGYL) 500 MG tablet Take 1 tablet (500 mg total) by mouth 3 (three) times daily. (Patient taking differently: Take 500 mg by mouth as needed. ) 42 tablet 0   No facility-administered medications prior to visit.   Past Medical History  Diagnosis Date  . [place]   . Hypercholesterolemia   . History of alcohol abuse   . History of substance abuse   . Depression   . 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 Primary sclerosing cholangitis     ? hepatitis overlap - liver bx x 2 and MRCP  . Allergy     SEASONAL  . Anxiety   . Cataract  BILATERAL/REMOVED  . Hypertension   . Personal history of adenomatous colonic polyps 12/2010, 03/2012    12/2010 - 8 mm serrated adenoma of rectum  . Chronic mesenteric ischemia ([place])   . Basal cell carcinoma of lower extremity 06/2011    on left leg  . Dysplastic nevus 06/2011    on right back.   . Suicide and self-inflicted poisoning by other sedatives and hypnotics 05/18/2012  . Major depressive disorder, recurrent episode, severe, without mention of psychotic behavior 04/12/2012  . C. difficile diarrhea 10/23/2012  . Autoimmune hepatitis ([place]) 01/19/2013  . Osteopenia   . Anemia   . Grover's disease    Past Surgical History  Procedure Laterality Date  . Cataract extraction, bilateral    . Pylonidal cyst removal    . Foot surgery      right  . Percutaneous liver biopsy  2007 and 2008  . Colonoscopy  2001,  05/02/2003, 01/28/11    2012: Right colon Crohn's, rectal polyp  . Esophagogastroduodenoscopy  01/28/11    Normal  . Colonoscopy  03/31/2012    Procedure: COLONOSCOPY;  Surgeon: Jerene Bears, MD;  Location: [facility] ENDOSCOPY;  Service: Gastroenterology;  Laterality: N/A;  . Mohs surgery Left 11/2013    left ankle parakerotosis removal by dermatologist  . Abdominal aortagram N/A 03/29/2012    Procedure: ABDOMINAL Maxcine Ham;  Surgeon: Serafina Mitchell, MD;  Location: [facility] CATH LAB;  Service: Cardiovascular;  Laterality: N/A;   Social History   Social History  . Marital Status: Single    Spouse Name: N/A  . Number of Children: 1  . Years of Education: N/A   Occupational History  . Disability     Social History Main Topics  . Smoking status: Former Smoker    Types: Cigarettes    Quit date: 03/28/1997  . Smokeless tobacco: Never Used  . Alcohol Use: No  . Drug Use: No     Comment: QUIT USING DRUGS IN 1997  . Sexual Activity: Not Asked   Other Topics Concern  . None   Social History Narrative   Family History  Problem Relation Age of Onset  . Heart disease Father   . Thyroid cancer Father   . Breast cancer Mother   . Stomach cancer Mother   . Colon cancer Neg Hx    Review of Systems As above    Objective:   Physical Exam @BP  110/80 mmHg  Pulse 80  Ht 6' 1.25" (1.861 m)  Wt 206 lb (93.441 kg)  BMI 26.98 kg/m2@  General:  NAD cushingoid Eyes:   anicteric Lungs:  clear Heart:: S1S2 no rubs, murmurs or gallops Abdomen:  soft and nontender, BS+ Ext:   no edema, cyanosis or clubbing    Data Reviewed:  Lab Results  Component Value Date   FERRITIN 16.0* 05/14/2015   Lab Results  Component Value Date   WBC 16.4* 05/14/2015   HGB 10.7* 05/14/2015   HCT 34.2* 05/14/2015   MCV 77.3* 05/14/2015   PLT 348.0 05/14/2015      Assessment & Plan:  CROHN'S DISEASE, LARGE AND SMALL INTESTINES Better Try to taper prednisone adalimumab levels and abs in 3 d trough   Long  term current use of systemic steroids Trying to taper still  Portal vein thrombosis Continue warfarin - re-image July/August  Autoimmune hepatitis-PSC overlap Lab Results  Component Value Date   ALT 38 03/24/2015   AST 26 03/24/2015   ALKPHOS 70 03/24/2015   BILITOT 0.5 03/24/2015  Anemia due to chronic blood loss feraheme again    RTC 3 months sooner prn I appreciate the opportunity to care for this patient. Cc: Melinda Crutch, MD

## 2015-05-26 NOTE — Assessment & Plan Note (Addendum)
Lab Results  Component Value Date   ALT 38 03/24/2015   AST 26 03/24/2015   ALKPHOS 70 03/24/2015   BILITOT 0.5 03/24/2015   Recheck LFT's this week

## 2015-05-26 NOTE — Assessment & Plan Note (Signed)
Trying to taper still

## 2015-05-26 NOTE — Patient Instructions (Signed)
  Your physician has requested that you go to the basement on Thurs. For lab work.    Follow up with [place] in 3 months.     I appreciate the opportunity to care for you.        Marland Kitchen

## 2015-05-26 NOTE — Assessment & Plan Note (Signed)
Continue warfarin - re-image July/August

## 2015-05-26 NOTE — Assessment & Plan Note (Signed)
feraheme again

## 2015-05-27 ENCOUNTER — Encounter: Payer: Self-pay | Admitting: Internal Medicine

## 2015-05-29 ENCOUNTER — Other Ambulatory Visit (INDEPENDENT_AMBULATORY_CARE_PROVIDER_SITE_OTHER): Payer: Medicare Other

## 2015-05-29 DIAGNOSIS — K754 Autoimmune hepatitis: Secondary | ICD-10-CM

## 2015-05-29 DIAGNOSIS — K50818 Crohn's disease of both small and large intestine with other complication: Secondary | ICD-10-CM | POA: Diagnosis not present

## 2015-05-29 LAB — HEPATIC FUNCTION PANEL
ALBUMIN: 3.3 g/dL — AB (ref 3.5–5.2)
ALK PHOS: 67 U/L (ref 39–117)
ALT: 46 U/L (ref 0–53)
AST: 26 U/L (ref 0–37)
Bilirubin, Direct: 0.1 mg/dL (ref 0.0–0.3)
TOTAL PROTEIN: 6 g/dL (ref 6.0–8.3)
Total Bilirubin: 0.3 mg/dL (ref 0.2–1.2)

## 2015-06-05 LAB — ADALIMUMAB+AB (SERIAL MONITOR)
Adalimumab Drug Level: 3.1 ug/mL
Anti-Adalimumab Antibody: <25 ng/mL

## 2015-06-05 NOTE — Progress Notes (Signed)
Quick Note:  Liver tests ok Humira levels good and no antibodies Try to keep tapering prednisone ______

## 2015-06-06 DIAGNOSIS — I81 Portal vein thrombosis: Secondary | ICD-10-CM | POA: Diagnosis not present

## 2015-06-06 DIAGNOSIS — F411 Generalized anxiety disorder: Secondary | ICD-10-CM | POA: Diagnosis not present

## 2015-06-06 DIAGNOSIS — Z7901 Long term (current) use of anticoagulants: Secondary | ICD-10-CM | POA: Diagnosis not present

## 2015-06-11 ENCOUNTER — Other Ambulatory Visit: Payer: Self-pay | Admitting: Internal Medicine

## 2015-06-11 ENCOUNTER — Encounter: Payer: Self-pay | Admitting: Internal Medicine

## 2015-06-11 DIAGNOSIS — K602 Anal fissure, unspecified: Secondary | ICD-10-CM

## 2015-06-11 DIAGNOSIS — K50819 Crohn's disease of both small and large intestine with unspecified complications: Secondary | ICD-10-CM

## 2015-06-11 MED ORDER — HYDROCODONE-ACETAMINOPHEN 5-325 MG PO TABS: 1.0000 | ORAL_TABLET | Freq: Four times a day (QID) | ORAL | Status: DC | PRN

## 2015-06-27 DIAGNOSIS — I81 Portal vein thrombosis: Secondary | ICD-10-CM | POA: Diagnosis not present

## 2015-06-27 DIAGNOSIS — Z7901 Long term (current) use of anticoagulants: Secondary | ICD-10-CM | POA: Diagnosis not present

## 2015-08-05 ENCOUNTER — Encounter: Payer: Self-pay | Admitting: Internal Medicine

## 2015-08-05 ENCOUNTER — Telehealth: Payer: Self-pay

## 2015-08-05 DIAGNOSIS — K50819 Crohn's disease of both small and large intestine with unspecified complications: Secondary | ICD-10-CM

## 2015-08-05 DIAGNOSIS — K602 Anal fissure, unspecified: Secondary | ICD-10-CM

## 2015-08-05 MED ORDER — HYDROCODONE-ACETAMINOPHEN 5-325 MG PO TABS: 1.0000 | ORAL_TABLET | Freq: Four times a day (QID) | ORAL | Status: DC | PRN

## 2015-08-05 NOTE — Telephone Encounter (Signed)
-----   Message from [name], MD sent at 08/05/2015  4:14 PM EDT ----- Regarding: Refill and labs Please print a refill for his percocet and I will sign soon - let me know  Also order CBC, CMET and ferritin  Dx: Chronic blood loss anemia Autoimmune hepatitis PSC

## 2015-08-05 NOTE — Telephone Encounter (Signed)
On my desk

## 2015-08-06 ENCOUNTER — Encounter: Payer: Self-pay | Admitting: Internal Medicine

## 2015-08-06 ENCOUNTER — Other Ambulatory Visit (INDEPENDENT_AMBULATORY_CARE_PROVIDER_SITE_OTHER): Payer: Medicare Other

## 2015-08-06 DIAGNOSIS — K50819 Crohn's disease of both small and large intestine with unspecified complications: Secondary | ICD-10-CM

## 2015-08-06 LAB — CBC WITH DIFFERENTIAL/PLATELET
Basophils Absolute: 0 10*3/uL (ref 0.0–0.1)
Basophils Relative: 0 % (ref 0.0–3.0)
EOS PCT: 0 % (ref 0.0–5.0)
Eosinophils Absolute: 0 10*3/uL (ref 0.0–0.7)
HCT: 28.8 % — ABNORMAL LOW (ref 39.0–52.0)
Hemoglobin: 8.6 g/dL — ABNORMAL LOW (ref 13.0–17.0)
LYMPHS ABS: 0.9 10*3/uL (ref 0.7–4.0)
Lymphocytes Relative: 4.5 % — ABNORMAL LOW (ref 12.0–46.0)
MCHC: 29.8 g/dL — AB (ref 30.0–36.0)
MCV: 66.3 fl — AB (ref 78.0–100.0)
MONOS PCT: 1.4 % — AB (ref 3.0–12.0)
Monocytes Absolute: 0.3 10*3/uL (ref 0.1–1.0)
NEUTROS ABS: 19.3 10*3/uL — AB (ref 1.4–7.7)
NEUTROS PCT: 94.1 % — AB (ref 43.0–77.0)
PLATELETS: 349 10*3/uL (ref 150.0–400.0)
RBC: 4.33 Mil/uL (ref 4.22–5.81)
RDW: 22.2 % — ABNORMAL HIGH (ref 11.5–15.5)
WBC: 19.9 10*3/uL (ref 4.0–10.5)

## 2015-08-06 LAB — COMPREHENSIVE METABOLIC PANEL
ALT: 21 U/L (ref 0–53)
AST: 14 U/L (ref 0–37)
Albumin: 3.4 g/dL — ABNORMAL LOW (ref 3.5–5.2)
Alkaline Phosphatase: 59 U/L (ref 39–117)
BILIRUBIN TOTAL: 0.4 mg/dL (ref 0.2–1.2)
BUN: 25 mg/dL — ABNORMAL HIGH (ref 6–23)
CALCIUM: 8.6 mg/dL (ref 8.4–10.5)
CHLORIDE: 102 meq/L (ref 96–112)
CO2: 26 meq/L (ref 19–32)
Creatinine, Ser: 1.17 mg/dL (ref 0.40–1.50)
GFR: 69.51 mL/min (ref >60.00)
Glucose, Bld: 154 mg/dL — ABNORMAL HIGH (ref 70–99)
Potassium: 4.4 mEq/L (ref 3.5–5.1)
Sodium: 136 mEq/L (ref 135–145)
Total Protein: 6 g/dL (ref 6.0–8.3)

## 2015-08-06 LAB — FERRITIN: Ferritin: 6.7 ng/mL — ABNORMAL LOW (ref 22.0–322.0)

## 2015-08-06 NOTE — Telephone Encounter (Signed)
RX to sign is in your pen drawer in your desk

## 2015-08-07 ENCOUNTER — Encounter: Payer: Self-pay | Admitting: Internal Medicine

## 2015-08-11 ENCOUNTER — Encounter: Payer: Self-pay | Admitting: Internal Medicine

## 2015-08-11 ENCOUNTER — Ambulatory Visit (INDEPENDENT_AMBULATORY_CARE_PROVIDER_SITE_OTHER): Payer: Medicare Other | Admitting: Internal Medicine

## 2015-08-11 VITALS — BP 134/70 | HR 88 | Ht 73.0 in | Wt 204.4 lb

## 2015-08-11 DIAGNOSIS — K5080 Crohn's disease of both small and large intestine without complications: Secondary | ICD-10-CM | POA: Diagnosis not present

## 2015-08-11 DIAGNOSIS — D500 Iron deficiency anemia secondary to blood loss (chronic): Secondary | ICD-10-CM | POA: Diagnosis not present

## 2015-08-11 DIAGNOSIS — I81 Portal vein thrombosis: Secondary | ICD-10-CM

## 2015-08-11 DIAGNOSIS — Z7901 Long term (current) use of anticoagulants: Secondary | ICD-10-CM

## 2015-08-11 DIAGNOSIS — H6981 Other specified disorders of Eustachian tube, right ear: Secondary | ICD-10-CM

## 2015-08-11 DIAGNOSIS — Z7960 Long term (current) use of unspecified immunomodulators and immunosuppressants: Secondary | ICD-10-CM

## 2015-08-11 DIAGNOSIS — K754 Autoimmune hepatitis: Secondary | ICD-10-CM

## 2015-08-11 DIAGNOSIS — Z79899 Other long term (current) drug therapy: Secondary | ICD-10-CM

## 2015-08-11 NOTE — Progress Notes (Signed)
   Subjective:    Patient ID: [name], male    DOB: 1965-03-28, 50 y.o.   MRN: [id number] Chief complaint: Anemia and fatigue HPI  [name] is here for follow-up of his Crohn's and autoimmune hepatitis situation. He had message to me through my chart recently and said he thought his iron was low again and sure enough is ferritin was low and see balloon was 8.6. Is not having any bleeding with this stools now though he has had intermittently. Diarrhea comes and goes. He says he has to be on 30 mg of prednisone at this time or he would feel awful. He's kind of down and fatigued at this point. He stopped his warfarin a few days ago, saying a medium bloat and feel funny on his right side. He said his been that way ever since he started it. He started taking some metronidazole he had around as well as says within days he felt better. Medications, allergies, past medical history, past surgical history, family history and social history are reviewed and updated in the EMR.  Review of Systems  As above, he also has some ear popping on the right and would like me to examine his year    Objective:   Physical Exam @BP  134/70 mmHg  Pulse 88  Ht 6' 1"  (1.854 m)  Wt 204 lb 6.4 oz (92.715 kg)  BMI 26.97 kg/m2@  General:  NAD Eyes:   anicteric Lungs:  clear Heart::  S1S2 no rubs, murmurs or gallops Abdomen:  soft and nontender, BS+ Ext:   no edema, cyanosis or clubbing Skin: Pale Ears - NL TMSAnd ear canals Pharynx: Clear Neck  no LA    Data Reviewed:   Previous GI notes this year. Last CT scan this year with portal vein thrombosis.        Assessment & Plan:   1. Anemia due to chronic blood loss   2. Portal vein thrombosis   3. Crohn's disease of both small and large intestine without complication ([place])   4. Autoimmune hepatitis-PSC overlap   5. Eustachian tube dysfunction, right   6. Long-term use of immunosuppressant medication   7. Chronic anticoagulation    Complicated  situation as usual some things are stable some things are worse some things might be improved.  His anemia is worse, no overt bleeding but presumably he is having chronic blood loss from his Crohn's disease. We'll order Feraheme 2 and check CBC and a ferritin 1 month after his last Feraheme.  He stopped his anticoagulation which is of concern. Over the years and dealing with [name] he will do this from time to time, I told him why he needed his medication but he does not want to take that right now. We'll go ahead and reassess his portal vein thrombosis with CT scan. If still present, though warfarin may be safer in the sense that is reversible, we may need to try something like Xarelto.  He is reassured regarding eustachian tube dysfunction.  He will continue his prednisone. Would try to reduce the dose some but he says he feels best at this dosing would have terrible diarrhea if he went lower he says.  We'll monitor his mood.  Further plans pending the above results.  I appreciate the opportunity to care for this patient. CC:  Melinda Crutch, MD

## 2015-08-11 NOTE — Patient Instructions (Addendum)
  You have been scheduled for a CT angio scan of the abdomen and pelvis at [facility]. You are scheduled on 08/13/15 at 4:30pm. You should arrive 15 minutes prior to your appointment time for registration. Please follow the written instructions below on the day of your exam:  WARNING: IF YOU ARE ALLERGIC TO IODINE/X-RAY DYE, PLEASE NOTIFY RADIOLOGY IMMEDIATELY AT [phone]! YOU WILL BE GIVEN A 13 HOUR PREMEDICATION PREP.  1) Do not eat or drink anything after 12:30pm (4 hours prior to your test) 2) You may take any medications as prescribed with a small amount of water except for the following: Metformin, Glucophage, Glucovance, Avandamet, Riomet, Fortamet, Actoplus Met, Janumet, Glumetza or Metaglip. The above medications must be held the day of the exam AND 48 hours after the exam.  This test typically takes 30-45 minutes to complete.  If you have any questions regarding your exam or if you need to reschedule, you may call the CT department at [phone] between the hours of 8:00 am and 5:00 pm, Monday-Friday.  ________________________________________________________________________  We have you set up for iron infusions: 08/15/15 at 10:00AM and 08/22/15 at 10:00AM, arrive at [facility] 15mns prior to your appointment, tell them your a Medical Day register patient. Ok to eat. Takes about an hour.  Please come 5 weeks after your last iron infusion to have blood test drawn. No appointment needed.  Follow up with u[place] August.   I appreciate the opportunity to care for you. CSilvano Rusk MD, [facility]

## 2015-08-11 NOTE — Assessment & Plan Note (Signed)
Stable to improved?

## 2015-08-11 NOTE — Assessment & Plan Note (Signed)
NL LFT

## 2015-08-11 NOTE — Assessment & Plan Note (Addendum)
Feraheme Contributing to fatigue ? Some to depression CBC and ferritin in 30 d from last ferheme

## 2015-08-11 NOTE — Assessment & Plan Note (Signed)
Repeat CT He is off warfarin

## 2015-08-12 ENCOUNTER — Encounter: Payer: Self-pay | Admitting: Internal Medicine

## 2015-08-13 ENCOUNTER — Encounter (HOSPITAL_COMMUNITY): Payer: Self-pay

## 2015-08-13 ENCOUNTER — Other Ambulatory Visit: Payer: Self-pay | Admitting: Internal Medicine

## 2015-08-13 ENCOUNTER — Other Ambulatory Visit: Payer: Self-pay

## 2015-08-13 ENCOUNTER — Ambulatory Visit (HOSPITAL_COMMUNITY)
Admission: RE | Admit: 2015-08-13 | Discharge: 2015-08-13 | Disposition: A | Discharge status: Home / Self Care | Payer: Medicare Other | Source: Ambulatory Visit | Attending: Internal Medicine | Admitting: Internal Medicine

## 2015-08-13 DIAGNOSIS — K50019 Crohn's disease of small intestine with unspecified complications: Secondary | ICD-10-CM

## 2015-08-13 DIAGNOSIS — I81 Portal vein thrombosis: Secondary | ICD-10-CM | POA: Diagnosis not present

## 2015-08-13 DIAGNOSIS — K51219 Ulcerative (chronic) proctitis with unspecified complications: Secondary | ICD-10-CM

## 2015-08-13 DIAGNOSIS — K8301 Primary sclerosing cholangitis: Secondary | ICD-10-CM

## 2015-08-13 DIAGNOSIS — K838 Other specified diseases of biliary tract: Secondary | ICD-10-CM | POA: Diagnosis not present

## 2015-08-13 DIAGNOSIS — K5090 Crohn's disease, unspecified, without complications: Secondary | ICD-10-CM | POA: Diagnosis not present

## 2015-08-13 DIAGNOSIS — R52 Pain, unspecified: Secondary | ICD-10-CM

## 2015-08-13 MED ORDER — IOPAMIDOL (ISOVUE-300) INJECTION 61%
100.0000 mL | Freq: Once | INTRAVENOUS | Status: AC | PRN
  Administered 2015-08-13: 100 mL via INTRAVENOUS

## 2015-08-13 MED ORDER — METRONIDAZOLE 500 MG PO TABS: 500.0000 mg | ORAL_TABLET | Freq: Two times a day (BID) | ORAL | Status: DC

## 2015-08-13 NOTE — Progress Notes (Signed)
Quick Note:  PVT gone ______

## 2015-08-13 NOTE — Telephone Encounter (Signed)
Would you like him to refill this Sir?

## 2015-08-14 ENCOUNTER — Encounter: Payer: Self-pay | Admitting: Internal Medicine

## 2015-08-15 ENCOUNTER — Ambulatory Visit (HOSPITAL_COMMUNITY)
Admission: RE | Admit: 2015-08-15 | Discharge: 2015-08-15 | Disposition: A | Discharge status: Home / Self Care | Payer: Medicare Other | Source: Ambulatory Visit | Attending: Internal Medicine | Admitting: Internal Medicine

## 2015-08-15 VITALS — BP 119/75 | HR 75 | Temp 98.3°F | Resp 20 | Ht 73.5 in | Wt 205.0 lb

## 2015-08-15 DIAGNOSIS — D500 Iron deficiency anemia secondary to blood loss (chronic): Secondary | ICD-10-CM | POA: Insufficient documentation

## 2015-08-15 MED ORDER — SODIUM CHLORIDE 0.9 % IV SOLN
510.0000 mg | INTRAVENOUS | Status: DC
  Administered 2015-08-15: 510 mg via INTRAVENOUS
  Filled 2015-08-15: qty 17

## 2015-08-21 ENCOUNTER — Other Ambulatory Visit (HOSPITAL_COMMUNITY): Payer: Self-pay | Admitting: *Deleted

## 2015-08-22 ENCOUNTER — Encounter: Payer: Self-pay | Admitting: Internal Medicine

## 2015-08-22 ENCOUNTER — Ambulatory Visit (HOSPITAL_COMMUNITY)
Admission: RE | Admit: 2015-08-22 | Discharge: 2015-08-22 | Disposition: A | Discharge status: Home / Self Care | Payer: Medicare Other | Source: Ambulatory Visit | Attending: Internal Medicine | Admitting: Internal Medicine

## 2015-08-22 DIAGNOSIS — D500 Iron deficiency anemia secondary to blood loss (chronic): Secondary | ICD-10-CM | POA: Insufficient documentation

## 2015-08-22 MED ORDER — SODIUM CHLORIDE 0.9 % IV SOLN
510.0000 mg | Freq: Once | INTRAVENOUS | Status: AC
  Administered 2015-08-22: 510 mg via INTRAVENOUS
  Filled 2015-08-22: qty 17

## 2015-08-22 NOTE — Progress Notes (Signed)
Feraheme orders were discontinued by automatic computer function. Spoke with Leone Payor, RN at Dr. Celesta Aver office. Per Rollene Fare, [name] did not cancel the order and patient is to receive the second dose. Order re entered. Pharmacy aware.

## 2015-08-27 DIAGNOSIS — Z961 Presence of intraocular lens: Secondary | ICD-10-CM | POA: Diagnosis not present

## 2015-08-27 DIAGNOSIS — H04123 Dry eye syndrome of bilateral lacrimal glands: Secondary | ICD-10-CM | POA: Diagnosis not present

## 2015-08-27 DIAGNOSIS — H1851 Endothelial corneal dystrophy: Secondary | ICD-10-CM | POA: Diagnosis not present

## 2015-08-28 ENCOUNTER — Encounter: Payer: Self-pay | Admitting: Internal Medicine

## 2015-08-28 DIAGNOSIS — L814 Other melanin hyperpigmentation: Secondary | ICD-10-CM | POA: Diagnosis not present

## 2015-08-28 DIAGNOSIS — D225 Melanocytic nevi of trunk: Secondary | ICD-10-CM | POA: Diagnosis not present

## 2015-08-28 DIAGNOSIS — L570 Actinic keratosis: Secondary | ICD-10-CM | POA: Diagnosis not present

## 2015-08-28 DIAGNOSIS — D2271 Melanocytic nevi of right lower limb, including hip: Secondary | ICD-10-CM | POA: Diagnosis not present

## 2015-08-28 DIAGNOSIS — Z85828 Personal history of other malignant neoplasm of skin: Secondary | ICD-10-CM | POA: Diagnosis not present

## 2015-08-28 DIAGNOSIS — D2272 Melanocytic nevi of left lower limb, including hip: Secondary | ICD-10-CM | POA: Diagnosis not present

## 2015-08-28 DIAGNOSIS — Q828 Other specified congenital malformations of skin: Secondary | ICD-10-CM | POA: Diagnosis not present

## 2015-08-28 DIAGNOSIS — D485 Neoplasm of uncertain behavior of skin: Secondary | ICD-10-CM | POA: Diagnosis not present

## 2015-08-28 DIAGNOSIS — L821 Other seborrheic keratosis: Secondary | ICD-10-CM | POA: Diagnosis not present

## 2015-08-28 DIAGNOSIS — L820 Inflamed seborrheic keratosis: Secondary | ICD-10-CM | POA: Diagnosis not present

## 2015-08-30 ENCOUNTER — Encounter: Payer: Self-pay | Admitting: Internal Medicine

## 2015-09-05 ENCOUNTER — Telehealth: Payer: Self-pay

## 2015-09-05 ENCOUNTER — Ambulatory Visit (INDEPENDENT_AMBULATORY_CARE_PROVIDER_SITE_OTHER): Payer: Medicare Other | Admitting: Internal Medicine

## 2015-09-05 ENCOUNTER — Ambulatory Visit (INDEPENDENT_AMBULATORY_CARE_PROVIDER_SITE_OTHER)
Admission: RE | Admit: 2015-09-05 | Discharge: 2015-09-05 | Disposition: A | Discharge status: Home / Self Care | Payer: Medicare Other | Source: Ambulatory Visit | Attending: Internal Medicine | Admitting: Internal Medicine

## 2015-09-05 ENCOUNTER — Telehealth: Payer: Self-pay | Admitting: Internal Medicine

## 2015-09-05 ENCOUNTER — Encounter: Payer: Self-pay | Admitting: Internal Medicine

## 2015-09-05 VITALS — BP 132/88 | HR 80 | Ht 73.25 in | Wt 205.1 lb

## 2015-09-05 DIAGNOSIS — R0600 Dyspnea, unspecified: Secondary | ICD-10-CM

## 2015-09-05 DIAGNOSIS — K611 Rectal abscess: Secondary | ICD-10-CM

## 2015-09-05 DIAGNOSIS — R0602 Shortness of breath: Secondary | ICD-10-CM | POA: Diagnosis not present

## 2015-09-05 DIAGNOSIS — K602 Anal fissure, unspecified: Secondary | ICD-10-CM | POA: Diagnosis not present

## 2015-09-05 DIAGNOSIS — R918 Other nonspecific abnormal finding of lung field: Secondary | ICD-10-CM

## 2015-09-05 DIAGNOSIS — K50819 Crohn's disease of both small and large intestine with unspecified complications: Secondary | ICD-10-CM

## 2015-09-05 MED ORDER — DILTIAZEM GEL 2 %: CUTANEOUS | Status: DC

## 2015-09-05 MED ORDER — LEVOFLOXACIN 500 MG PO TABS: 500.0000 mg | ORAL_TABLET | Freq: Every day | ORAL | Status: DC

## 2015-09-05 NOTE — Patient Instructions (Addendum)
  We have faxed your diltiazem gel rx to [facility] for you to pick up.    We have sent the following medications to your pharmacy for you to pick up at your convenience: Levaquin   Today please go to the basement and get a Chest x-ray done before leaving.   Please come back and see Dr Carlean Purl on 09/26/15 at 11:00AM.   We have made you an appointment with __________________________in our pulmonary dept on ____________________.  I will call you with details.

## 2015-09-05 NOTE — Progress Notes (Signed)
   Subjective:    Patient ID: [name], male    DOB: 1965-09-20, 50 y.o.   MRN: [id number] Cc: abscess, lung infiltratess HPI [name] is here w/ 2 week hx of painful perianal nodule that intermittently drains pus. Also concerned about lung infiltrates reported on CT  Has been using metronidazole 1/day "when I get the chills"  No true fevers  Says he is dyspneic  The portal vein thrombosis was gone on recent CT - he is off anti-coag Tx  Medications, allergies, past medical history, past surgical history, family history and social history are reviewed and updated in the EMR.   Review of Systems As above    Objective:   Physical Exam BP 132/88 mmHg  Pulse 80  Ht 6' 1.25" (1.861 m)  Wt 205 lb 2 oz (93.044 kg)  BMI 26.87 kg/m2 NAD - cushingoid Rectal - mild perianal erythema Tender post fissure and nodule - no pus, mild anal stenosis Lungs CTA Cor S1 S2 no murmur Abd NT    Assessment & Plan:   Encounter Diagnoses  Name Primary?  . Perirectal abscess Yes  . Anal fissure   . Pulmonary infiltrates   . Dyspnea   . Crohn's disease of small and large intestines with complication ([name])    levaquin 500 mg qd x 2 weeks for abscess Reassess 7/28 OV Pulmonary consult re: infiltrates PA/lat CXR today  Cc: Melinda Crutch, MD  See me 7/28

## 2015-09-05 NOTE — Telephone Encounter (Signed)
Called spoke with PJ. appt scheduled to see MW Monday 7/10 at 10:30. Nothing further needed

## 2015-09-05 NOTE — Telephone Encounter (Signed)
Mindy from pulmonary called and made Cartez an appointment for Monday July 10th at 10:30AM with Dr Melvyn Novas.  Left this information on Charlies voicemail and also was able to reach his Dad and leave the information.

## 2015-09-05 NOTE — Assessment & Plan Note (Signed)
Abscess - Abx Continue current rx

## 2015-09-08 ENCOUNTER — Encounter: Payer: Self-pay | Admitting: Internal Medicine

## 2015-09-08 ENCOUNTER — Ambulatory Visit (INDEPENDENT_AMBULATORY_CARE_PROVIDER_SITE_OTHER): Payer: Medicare Other | Admitting: Internal Medicine

## 2015-09-08 VITALS — BP 138/90 | HR 82 | Ht 73.0 in | Wt 203.6 lb

## 2015-09-08 DIAGNOSIS — D500 Iron deficiency anemia secondary to blood loss (chronic): Secondary | ICD-10-CM

## 2015-09-08 DIAGNOSIS — R0600 Dyspnea, unspecified: Secondary | ICD-10-CM | POA: Diagnosis not present

## 2015-09-08 NOTE — Assessment & Plan Note (Signed)
09/08/2015  Walked RA x 3 laps @ 185 ft each stopped due to end of study, nl pace, no desats - Spirometry 09/08/2015  FEV1 3.30 (78%)  Ratio 80     He is feeling better p last iron rx with really very minimal streaky atx changes on CT abd corresponding to a "nodular" area on cxr which could be related to chron's dz affecting the lung but more likely from previous infection but is not worrisome or need further w/u.  However, if his doe worsens when not directly related to Hgb levels or to flare of crohn's dz needs to return for f/u here.  Total time devoted to counseling  = 35/73mreview case with pt/ discussion of options/alternatives/ personally creating written instructions  in presence of pt  then going over those specific  Instructions directly with the pt including how to use all of the meds but in particular covering each new medication in detail and the difference between the maintenance/automatic meds and the prns using an action plan format for the latter.

## 2015-09-08 NOTE — Assessment & Plan Note (Signed)
  Lab Results  Component Value Date   HGB 8.6 Repeated and verified X2.* 08/06/2015   HGB 10.7* 05/14/2015   HGB 10.7* 03/24/2015    F/u by Dr Carlean Purl planned

## 2015-09-08 NOTE — Patient Instructions (Signed)
Return if you note a decline in your exercise tolerance that is not directly related to crohn's activity or anemia.  You do not have significant residual lung damage from smoking and you very likely never will at this point.

## 2015-09-08 NOTE — Progress Notes (Signed)
Subjective:    Patient ID: [name], male    DOB: [birth year],    MRN: [id number]  HPI  73 yowm quit smoking in 1999 after severe cough which resolved able to do cross country bike riding including up 2 h with hills and fine until quit biking in 2007 due to Crohn's but new onset sob 1st of 2017 and referred to pulmonary clinic 09/08/2015 by Dr Carlean Purl.   09/08/2015 1st [facility] office visit/ Wert   Chief Complaint  Patient presents with  . Pulmonary Consult    Referred by [name]. Pt c/o SOB x 6 months. He gets winded grovery shopping and walking to his mailbox and back.    indolent onset doe x 6 m assoc with anemia and now on fe shots and last infusion 08/22/15 and not better since then with doe x 52 ft slt uphill from shed to house, even flat in grocery store can't finish shopping s fatigue and sob after 30 m  Has h/o season rhinitis spring > fall since age 50 and better on flonase   No obvious day to day or daytime variability or assoc excess/ purulent sputum or mucus plugs or hemoptysis or cp or chest tightness, subjective wheeze or overt sinus or hb symptoms. No unusual exp hx or h/o childhood pna/ asthma or knowledge of premature birth.  Sleeping ok without nocturnal  or early am exacerbation  of respiratory  c/o's or need for noct saba. Also denies any obvious fluctuation of symptoms with weather or environmental changes or other aggravating or alleviating factors except as outlined above   Current Medications, Allergies, Complete Past Medical History, Past Surgical History, Family History, and Social History were reviewed in [employer] record.  ROS  The following are not active complaints unless bolded sore throat, dysphagia, dental problems, itching, sneezing,  nasal congestion or excess/ purulent secretions, ear ache,   fever, chills, sweats, unintended wt loss, classically pleuritic or exertional cp,  orthopnea pnd or leg swelling,  presyncope, palpitations, abdominal pain, anorexia, nausea, vomiting, diarrhea  or change in bowel or bladder habits, change in stools or urine, dysuria,hematuria,  rash, arthralgias, visual complaints, headache, numbness, weakness or ataxia or problems with walking or coordination,  change in mood/affect or memory.             Review of Systems  Constitutional: Negative for fever, chills, activity change, appetite change and unexpected weight change.  HENT: Positive for congestion. Negative for dental problem, postnasal drip, rhinorrhea, sneezing, sore throat, trouble swallowing and voice change.   Eyes: Negative for visual disturbance.  Respiratory: Positive for shortness of breath. Negative for cough and choking.   Cardiovascular: Negative for chest pain and leg swelling.  Gastrointestinal: Negative for nausea, vomiting and abdominal pain.       Indigestion  Genitourinary: Negative for difficulty urinating.  Musculoskeletal: Positive for arthralgias.  Skin: Negative for rash.  Psychiatric/Behavioral: Negative for behavioral problems and confusion.       Objective:   Physical Exam  amb somber slt pale wm  nad  Wt Readings from Last 3 Encounters:  09/08/15 203 lb 9.6 oz (92.352 kg)  09/05/15 205 lb 2 oz (93.044 kg)  08/22/15 205 lb (92.987 kg)    Vital signs reviewed   HEENT: nl dentition, turbinates, and oropharynx. Nl external ear canals without cough reflex   NECK :  without JVD/Nodes/TM/ nl carotid upstrokes bilaterally   LUNGS: no acc muscle use,  Nl contour  chest which is clear to A and P bilaterally without cough on insp or exp maneuvers   CV:  RRR  no s3 or murmur or increase in P2, no edema   ABD:  soft and nontender with nl inspiratory excursion in the supine position. No bruits or organomegaly, bowel sounds nl  MS:  Nl gait/ ext warm without deformities, calf tenderness, cyanosis or clubbing No obvious joint restrictions   SKIN: warm and dry without  lesions    NEURO:  alert, approp, nl sensorium with  no motor deficits       I personally reviewed images and agree with radiology impression as follows:  CXR:  09/05/15 Normal cardiac silhouette. No effusion, infiltrate pneumothorax. There is a nodule in the LEFT lung base just above the diaphragm. This likely corresponds to band of atelectasis comparison CT from 08/13/2015. No pleural fluid. No pulmonary edema        Assessment & Plan:

## 2015-09-20 ENCOUNTER — Encounter: Payer: Self-pay | Admitting: Internal Medicine

## 2015-09-22 ENCOUNTER — Other Ambulatory Visit: Payer: Self-pay | Admitting: Internal Medicine

## 2015-09-22 DIAGNOSIS — K611 Rectal abscess: Secondary | ICD-10-CM

## 2015-09-22 MED ORDER — LEVOFLOXACIN 500 MG PO TABS: 500.0000 mg | ORAL_TABLET | Freq: Every day | ORAL | 0 refills | Status: AC

## 2015-09-26 ENCOUNTER — Ambulatory Visit (INDEPENDENT_AMBULATORY_CARE_PROVIDER_SITE_OTHER): Payer: Medicare Other | Admitting: Internal Medicine

## 2015-09-26 ENCOUNTER — Encounter: Payer: Self-pay | Admitting: Internal Medicine

## 2015-09-26 ENCOUNTER — Other Ambulatory Visit (INDEPENDENT_AMBULATORY_CARE_PROVIDER_SITE_OTHER): Payer: Medicare Other

## 2015-09-26 VITALS — BP 114/78 | HR 74 | Ht 73.0 in | Wt 201.0 lb

## 2015-09-26 DIAGNOSIS — D500 Iron deficiency anemia secondary to blood loss (chronic): Secondary | ICD-10-CM | POA: Diagnosis not present

## 2015-09-26 DIAGNOSIS — K611 Rectal abscess: Secondary | ICD-10-CM | POA: Diagnosis not present

## 2015-09-26 DIAGNOSIS — K602 Anal fissure, unspecified: Secondary | ICD-10-CM

## 2015-09-26 DIAGNOSIS — K50819 Crohn's disease of both small and large intestine with unspecified complications: Secondary | ICD-10-CM | POA: Diagnosis not present

## 2015-09-26 LAB — CBC WITH DIFFERENTIAL/PLATELET
BASOS PCT: 0.1 % (ref 0.0–3.0)
Basophils Absolute: 0 10*3/uL (ref 0.0–0.1)
EOS ABS: 0 10*3/uL (ref 0.0–0.7)
Eosinophils Relative: 0 % (ref 0.0–5.0)
HCT: 36.1 % — ABNORMAL LOW (ref 39.0–52.0)
HEMOGLOBIN: 11.7 g/dL — AB (ref 13.0–17.0)
LYMPHS ABS: 0.5 10*3/uL — AB (ref 0.7–4.0)
Lymphocytes Relative: 4.7 % — ABNORMAL LOW (ref 12.0–46.0)
MCHC: 32.4 g/dL (ref 30.0–36.0)
MCV: 78.7 fl (ref 78.0–100.0)
MONO ABS: 0.3 10*3/uL (ref 0.1–1.0)
Monocytes Relative: 3.4 % (ref 3.0–12.0)
NEUTROS PCT: 91.8 % — AB (ref 43.0–77.0)
Neutro Abs: 9.2 10*3/uL — ABNORMAL HIGH (ref 1.4–7.7)
Platelets: 291 10*3/uL (ref 150.0–400.0)
RBC: 4.59 Mil/uL (ref 4.22–5.81)
RDW: 31.3 % — AB (ref 11.5–15.5)
WBC: 10.1 10*3/uL (ref 4.0–10.5)

## 2015-09-26 LAB — FERRITIN: FERRITIN: 27.9 ng/mL (ref 22.0–322.0)

## 2015-09-26 MED ORDER — HYDROCODONE-ACETAMINOPHEN 5-325 MG PO TABS: 1.0000 | ORAL_TABLET | Freq: Four times a day (QID) | ORAL | 0 refills | Status: DC | PRN

## 2015-09-26 NOTE — Patient Instructions (Addendum)
   Today we have given you a refill on your hydrocodone (printed rx ).    I will call you with an appointment with Dr [name] for your peri-rectal abscess.  They are located at [address] [place] [address] , phone [phone]    I appreciate the opportunity to care for you. Silvano Rusk, MD, [facility]

## 2015-09-28 ENCOUNTER — Encounter: Payer: Self-pay | Admitting: Internal Medicine

## 2015-09-28 NOTE — Progress Notes (Signed)
   Subjective:    Patient ID: [name], male    DOB: [birth year], 50 y.o.   MRN: [id number] Cc: f/u perirectal abscess HPI Was on tx w/ levofloxacin for perirectal abscess - was improved but messaged that it was enlarging again - so after 2 weeks Tx and 5-6 days off - we retsrted Tx - he says had large dc pus 3 d ago - and its better   Also wants iron and CBC checked as planned after feraheme  Medications, allergies, past medical history, past surgical history, family history and social history are reviewed and updated in the EMR.   Review of Systems As above    Objective:   Physical Exam BP 114/78   Pulse 74   Ht 6' 1"  (1.854 m)   Wt 201 lb (91.2 kg)   BMI 26.52 kg/m  Abdomen soft Rectal - small fissure/fistula posterior and some swelling anterior - violaceous - ? Hemorrhoid Not tender No dc and no fluctuance       Assessment & Plan:   Encounter Diagnoses  Name Primary?  . Perirectal abscess Yes  . Crohn's disease of small and large intestines with complication ([place])   . Posterior Anal fissure     Having trouble with perianal disease it sems - not bad We discussed evaluation by surgeon - he desires and is reasonable though seems to be getting better w/ Abx  Will ask Dr. Marcello Moores or Gross for input  Recheck CBC and ferritin after last feraheme  15 minutes time spent with patient > half in counseling coordination of care  I appreciate the opportunity to care for this patient.   Cc: Melinda Crutch, MD

## 2015-09-29 ENCOUNTER — Other Ambulatory Visit: Payer: Self-pay | Admitting: Internal Medicine

## 2015-09-29 ENCOUNTER — Telehealth: Payer: Self-pay

## 2015-09-29 NOTE — Telephone Encounter (Signed)
[name]'s father informed as [name] not available.

## 2015-09-29 NOTE — Telephone Encounter (Signed)
[facility] appt set up for eval and treat peri-rectal abscess 10/20/15 at 1:30pm with Dr. Joyice Faster.  They will mail him information. He wants to know if he is to continue his levofloxacin until his appointment, has enough to last til Friday.

## 2015-09-29 NOTE — Telephone Encounter (Signed)
Finish the levaquin and observe -

## 2015-09-30 ENCOUNTER — Other Ambulatory Visit: Payer: Self-pay

## 2015-09-30 DIAGNOSIS — D509 Iron deficiency anemia, unspecified: Secondary | ICD-10-CM

## 2015-09-30 NOTE — Progress Notes (Signed)
His ferritin is low NL only so I recommend another treatment with feraheme x 2 nd ferritin and CBC 1 month later

## 2015-09-30 NOTE — Addendum Note (Signed)
Addended by: Marlon Pel on: 09/30/2015 11:16 AM   Modules accepted: Orders

## 2015-10-03 ENCOUNTER — Other Ambulatory Visit: Payer: Self-pay | Admitting: Internal Medicine

## 2015-10-06 ENCOUNTER — Encounter (HOSPITAL_COMMUNITY): Payer: Self-pay

## 2015-10-06 ENCOUNTER — Encounter (HOSPITAL_COMMUNITY)
Admission: RE | Admit: 2015-10-06 | Discharge: 2015-10-06 | Disposition: A | Discharge status: Home / Self Care | Payer: Medicare Other | Source: Ambulatory Visit | Attending: Internal Medicine | Admitting: Internal Medicine

## 2015-10-06 ENCOUNTER — Other Ambulatory Visit (HOSPITAL_COMMUNITY): Payer: Self-pay | Admitting: Internal Medicine

## 2015-10-06 DIAGNOSIS — D509 Iron deficiency anemia, unspecified: Secondary | ICD-10-CM | POA: Diagnosis not present

## 2015-10-06 MED ORDER — SODIUM CHLORIDE 0.9 % IV SOLN
Freq: Once | INTRAVENOUS | Status: AC
  Administered 2015-10-06: 13:00:00 via INTRAVENOUS

## 2015-10-06 MED ORDER — SODIUM CHLORIDE 0.9 % IV SOLN
510.0000 mg | INTRAVENOUS | Status: DC
  Administered 2015-10-06: 510 mg via INTRAVENOUS
  Filled 2015-10-06: qty 17

## 2015-10-06 NOTE — Discharge Instructions (Signed)
Feraheme °Ferumoxytol injection °What is this medicine? °FERUMOXYTOL is an iron complex. Iron is used to make healthy red blood cells, which carry oxygen and nutrients throughout the body. This medicine is used to treat iron deficiency anemia in people with chronic kidney disease. °This medicine may be used for other purposes; ask your health care provider or pharmacist if you have questions. °What should I tell my health care provider before I take this medicine? °They need to know if you have any of these conditions: °-anemia not caused by low iron levels °-high levels of iron in the blood °-magnetic resonance imaging (MRI) test scheduled °-an unusual or allergic reaction to iron, other medicines, foods, dyes, or preservatives °-pregnant or trying to get pregnant °-breast-feeding °How should I use this medicine? °This medicine is for injection into a vein. It is given by a health care professional in a hospital or clinic setting. °Talk to your pediatrician regarding the use of this medicine in children. Special care may be needed. °Overdosage: If you think you have taken too much of this medicine contact a poison control center or emergency room at once. °NOTE: This medicine is only for you. Do not share this medicine with others. °What if I miss a dose? °It is important not to miss your dose. Call your doctor or health care professional if you are unable to keep an appointment. °What may interact with this medicine? °This medicine may interact with the following medications: °-other iron products °This list may not describe all possible interactions. Give your health care provider a list of all the medicines, herbs, non-prescription drugs, or dietary supplements you use. Also tell them if you smoke, drink alcohol, or use illegal drugs. Some items may interact with your medicine. °What should I watch for while using this medicine? °Visit your doctor or healthcare professional regularly. Tell your doctor or  healthcare professional if your symptoms do not start to get better or if they get worse. You may need blood work done while you are taking this medicine. °You may need to follow a special diet. Talk to your doctor. Foods that contain iron include: whole grains/cereals, dried fruits, beans, or peas, leafy green vegetables, and organ meats (liver, kidney). °What side effects may I notice from receiving this medicine? °Side effects that you should report to your doctor or health care professional as soon as possible: °-allergic reactions like skin rash, itching or hives, swelling of the face, lips, or tongue °-breathing problems °-changes in blood pressure °-feeling faint or lightheaded, falls °-fever or chills °-flushing, sweating, or hot feelings °-swelling of the ankles or feet °Side effects that usually do not require medical attention (Report these to your doctor or health care professional if they continue or are bothersome.): °-diarrhea °-headache °-nausea, vomiting °-stomach pain °This list may not describe all possible side effects. Call your doctor for medical advice about side effects. You may report side effects to FDA at 1-800-FDA-1088. °Where should I keep my medicine? °This drug is given in a hospital or clinic and will not be stored at home. °NOTE: This sheet is a summary. It may not cover all possible information. If you have questions about this medicine, talk to your doctor, pharmacist, or health care provider. °  °© 2016, [employer]vier/[employer]. (2011-10-01 15:23:36) ° °

## 2015-10-07 ENCOUNTER — Other Ambulatory Visit: Payer: Self-pay | Admitting: General Surgery

## 2015-10-07 DIAGNOSIS — K603 Anal fistula: Secondary | ICD-10-CM | POA: Diagnosis not present

## 2015-10-07 NOTE — H&P (Signed)
History of Present Illness (Evin Chirco MD; 10/07/2015 10:31 AM) The patient is a 50 year old male who presents with anal fistula. 50-year-old male with Crohn's disease on Humira and steroids who presents to the office for evaluation of a persistent perirectal infection. He states that his been present for approximately 3-4 weeks. Before that he had a fissure in the same area. He has changed his diet and has reduced his bowel movements to approximately 1-2 a day. He denies any rectal bleeding. He is currently on 30 mg of prednisone a day.   Other Problems (Ashley Beck, CMA; 10/07/2015 10:23 AM) Alcohol Abuse Anxiety Disorder Arthritis Back Pain Cirrhosis Of Liver Crohn's Disease Depression Hemorrhoids Hepatitis Other disease, cancer, significant illness  Past Surgical History (Ashley Beck, CMA; 10/07/2015 10:23 AM) Cataract Surgery Bilateral. Colon Polyp Removal - Colonoscopy Foot Surgery Right.  Diagnostic Studies History (Ashley Beck, CMA; 10/07/2015 10:23 AM) Colonoscopy within last year  Allergies (Ashley Beck, CMA; 10/07/2015 10:24 AM) No Known Drug Allergies 10/07/2015  Medication History (Ashley Beck, CMA; 10/07/2015 10:28 AM) Ascorbic Acid CR (1000MG Tablet ER, Oral) Active. Calcium-Vitamin D (900MG Capsule, Oral) Active. ClonazePAM (0.5MG Tablet, Oral) Active. DILTIAZEM GEL (2% Gel, External) Active. Fluticasone Propionate (50MCG/ACT Suspension, Nasal) Active. Humira Pen (40MG/0.8ML Pen-inj Kit, Subcutaneous) Active. Hydrocodone-Acetaminophen (5-325MG Tablet, Oral) Active. LevoFLOXacin (500MG Tablet, Oral) Active. MetroNIDAZOLE (500MG Tablet, Oral) Active. Bactroban (2% Cream, External) Active. PredniSONE (20MG Tablet, Oral) Active. Mirtazapine (30MG Tablet, Oral) Active. Medications Reconciled  Social History (Ashley Beck, CMA; 10/07/2015 10:23 AM) Alcohol use Remotely quit alcohol use. Caffeine use Tea. Illicit drug use Remotely quit  drug use. Tobacco use Former smoker.  Family History (Ashley Beck, CMA; 10/07/2015 10:23 AM) Arthritis Father, Mother. Breast Cancer Mother. Depression Brother, Father, Mother. Heart Disease Father. Heart disease in male family member before age 55 Hypertension Father. Ischemic Bowel Disease Mother. Thyroid problems Father.     Review of Systems (Ashley Beck CMA; 10/07/2015 10:23 AM) General Present- Chills, Fatigue and Weight Loss. Not Present- Appetite Loss, Fever, Night Sweats and Weight Gain. Skin Present- Dryness. Not Present- Change in Wart/Mole, Hives, Jaundice, New Lesions, Non-Healing Wounds, Rash and Ulcer. HEENT Present- Hearing Loss, Hoarseness, Seasonal Allergies, Visual Disturbances and Wears glasses/contact lenses. Not Present- Earache, Nose Bleed, Oral Ulcers, Ringing in the Ears, Sinus Pain, Sore Throat and Yellow Eyes. Respiratory Present- Difficulty Breathing. Not Present- Bloody sputum, Chronic Cough, Snoring and Wheezing. Breast Not Present- Breast Mass, Breast Pain, Nipple Discharge and Skin Changes. Cardiovascular Present- Shortness of Breath and Swelling of Extremities. Not Present- Chest Pain, Difficulty Breathing Lying Down, Leg Cramps, Palpitations and Rapid Heart Rate. Gastrointestinal Present- Abdominal Pain, Bloating, Bloody Stool, Chronic diarrhea, Difficulty Swallowing, Excessive gas, Hemorrhoids, Indigestion, Nausea and Rectal Pain. Not Present- Change in Bowel Habits, Constipation, Gets full quickly at meals and Vomiting. Male Genitourinary Not Present- Blood in Urine, Change in Urinary Stream, Frequency, Impotence, Nocturia, Painful Urination, Urgency and Urine Leakage. Musculoskeletal Present- Back Pain, Joint Pain, Joint Stiffness, Muscle Pain, Muscle Weakness and Swelling of Extremities. Neurological Present- Decreased Memory, Headaches and Tingling. Not Present- Fainting, Numbness, Seizures, Tremor, Trouble walking and Weakness. Psychiatric  Present- Anxiety, Change in Sleep Pattern and Depression. Not Present- Bipolar, Fearful and Frequent crying. Endocrine Not Present- Cold Intolerance, Excessive Hunger, Hair Changes, Heat Intolerance, Hot flashes and New Diabetes. Hematology Present- Easy Bruising. Not Present- Blood Thinners, Excessive bleeding, Gland problems, HIV and Persistent Infections.  Vitals (Ashley Beck CMA; 10/07/2015 10:27 AM) 10/07/2015 10:27 AM Weight: 202 lb Height: 73in   Body Surface Area: 2.16 m Body Mass Index: 26.65 kg/m  Temp.: 97.5F(Temporal)  Pulse: 75 (Regular)  BP: 128/70 (Sitting, Left Arm, Standard)      Physical Exam (Enez Monahan MD; 10/07/2015 10:37 AM)  General Mental Status-Alert. General Appearance-Not in acute distress. Build & Nutrition-Well nourished. Posture-Normal posture. Gait-Normal.  Head and Neck Head-normocephalic, atraumatic with no lesions or palpable masses. Trachea-midline.  Chest and Lung Exam Chest and lung exam reveals -on auscultation, normal breath sounds, no adventitious sounds and normal vocal resonance.  Cardiovascular Cardiovascular examination reveals -normal heart sounds, regular rate and rhythm with no murmurs.  Abdomen Inspection Inspection of the abdomen reveals - No Hernias. Palpation/Percussion Palpation and Percussion of the abdomen reveal - Soft, Non Tender, No Rigidity (guarding), No hepatosplenomegaly and No Palpable abdominal masses.  Rectal Anorectal Exam External - Note: Posterior midline external opening noted, fistula probe passes with ease to the level of sphincter.  Neurologic Neurologic evaluation reveals -alert and oriented x 3 with no impairment of recent or remote memory, normal attention span and ability to concentrate, normal sensation and normal coordination.  Musculoskeletal Normal Exam - Bilateral-Upper Extremity Strength Normal and Lower Extremity Strength Normal.    Assessment & Plan  (Johnita Palleschi MD; 10/07/2015 10:38 AM)  ANAL FISTULA (K60.3) Impression: 50-year-old male with Crohn's disease and new perirectal abscess. This is most likely a fistula forming. I have recommended seton placement and to continue his biologic treatment. We will leave the seton in for several months and allow the area to heal. Once this happens, the seton can be removed in the office. We discussed that this has the highest success rate for people with Crohn's disease as it does not involve injuring any of his sphincter muscle. 

## 2015-10-13 ENCOUNTER — Encounter (HOSPITAL_COMMUNITY): Payer: Self-pay

## 2015-10-13 ENCOUNTER — Encounter (HOSPITAL_COMMUNITY)
Admission: RE | Admit: 2015-10-13 | Discharge: 2015-10-13 | Disposition: A | Discharge status: Home / Self Care | Payer: Medicare Other | Source: Ambulatory Visit | Attending: Internal Medicine | Admitting: Internal Medicine

## 2015-10-13 ENCOUNTER — Telehealth: Payer: Self-pay

## 2015-10-13 DIAGNOSIS — D509 Iron deficiency anemia, unspecified: Secondary | ICD-10-CM

## 2015-10-13 MED ORDER — SODIUM CHLORIDE 0.9 % IV SOLN
510.0000 mg | INTRAVENOUS | Status: AC
  Administered 2015-10-13: 510 mg via INTRAVENOUS
  Filled 2015-10-13: qty 17

## 2015-10-13 MED ORDER — SODIUM CHLORIDE 0.9 % IV SOLN
Freq: Once | INTRAVENOUS | Status: AC
  Administered 2015-10-13: 14:00:00 via INTRAVENOUS

## 2015-10-13 NOTE — Progress Notes (Signed)
Pt arrived today for #2/2 Feraheme infusion. Temp 100.2 and having some rectal drainage. Call was placed to Dr Celesta Aver office as to whether to proceed. Meanwhile patient states he is for upcoming surgery for his rectal problem and he called [facility] to speak with Dr Marcello Moores about this fever. He states he was told she was out of the office and suggested he call Dr Celesta Aver office about this.

## 2015-10-13 NOTE — Progress Notes (Signed)
Spoke with Barbera Setters at Dr Celesta Aver office and we are to procede with the Feraheme infusion and Dr Carlean Purl is out of the office but she will send him a message about the fever. Pt to expect a call from the office. Pt was notified of this

## 2015-10-13 NOTE — Discharge Instructions (Signed)

## 2015-10-13 NOTE — Progress Notes (Signed)
Pt. Tolerated 2nd infusion of Feraheme without any problems, pt. Expecting return call from doctor's office.

## 2015-10-13 NOTE — Telephone Encounter (Signed)
Patient with low grade fever 100.2 and increased drainage from peri-rectal abscess.  He contacted Dr. Manon Hilding office today and was instructed to call our office.  Patient is scheduled for surgery with Dr. Marcello Moores on 10/20/15.  Does he need additional antibiotics until surgery?

## 2015-10-14 ENCOUNTER — Encounter: Payer: Self-pay | Admitting: Internal Medicine

## 2015-10-14 MED ORDER — LEVOFLOXACIN 500 MG PO TABS: 500.0000 mg | ORAL_TABLET | Freq: Every day | ORAL | 0 refills | Status: DC

## 2015-10-14 NOTE — Telephone Encounter (Signed)
Restart/refill Levaquin at prior dose and # to disp please

## 2015-10-14 NOTE — Telephone Encounter (Signed)
I left a message for the patient that rx for levaquin has been called back in.  He is asked to call back for any additional questions or concerns

## 2015-10-22 ENCOUNTER — Other Ambulatory Visit: Payer: Self-pay | Admitting: Internal Medicine

## 2015-10-22 NOTE — Telephone Encounter (Signed)
Refill x 6

## 2015-10-22 NOTE — Telephone Encounter (Signed)
Ok to refill Sir?

## 2015-10-26 ENCOUNTER — Encounter: Payer: Self-pay | Admitting: Internal Medicine

## 2015-10-27 ENCOUNTER — Other Ambulatory Visit: Payer: Self-pay | Admitting: Internal Medicine

## 2015-10-27 MED ORDER — METRONIDAZOLE 500 MG PO TABS: 500.0000 mg | ORAL_TABLET | Freq: Two times a day (BID) | ORAL | 0 refills | Status: DC

## 2015-10-31 ENCOUNTER — Encounter (HOSPITAL_BASED_OUTPATIENT_CLINIC_OR_DEPARTMENT_OTHER): Payer: Self-pay | Admitting: *Deleted

## 2015-10-31 NOTE — Progress Notes (Signed)
NPO AFTER MN.  ARRIVE AT 0730.  NEEDS HG.  MAY TAKE HYDRCODONE AM DOS W/ SIPS OF WATER IF NEEDED.

## 2015-11-05 ENCOUNTER — Other Ambulatory Visit (INDEPENDENT_AMBULATORY_CARE_PROVIDER_SITE_OTHER): Payer: Medicare Other

## 2015-11-05 DIAGNOSIS — D509 Iron deficiency anemia, unspecified: Secondary | ICD-10-CM

## 2015-11-05 LAB — FERRITIN: FERRITIN: 83.1 ng/mL (ref 22.0–322.0)

## 2015-11-05 LAB — CBC WITH DIFFERENTIAL/PLATELET
BASOS PCT: 0.2 % (ref 0.0–3.0)
Basophils Absolute: 0 10*3/uL (ref 0.0–0.1)
EOS PCT: 0.8 % (ref 0.0–5.0)
Eosinophils Absolute: 0.1 10*3/uL (ref 0.0–0.7)
HCT: 36.8 % — ABNORMAL LOW (ref 39.0–52.0)
Hemoglobin: 12.3 g/dL — ABNORMAL LOW (ref 13.0–17.0)
LYMPHS ABS: 2.3 10*3/uL (ref 0.7–4.0)
Lymphocytes Relative: 19.1 % (ref 12.0–46.0)
MCHC: 33.2 g/dL (ref 30.0–36.0)
MCV: 86 fl (ref 78.0–100.0)
MONOS PCT: 12 % (ref 3.0–12.0)
Monocytes Absolute: 1.4 10*3/uL — ABNORMAL HIGH (ref 0.1–1.0)
NEUTROS ABS: 8.1 10*3/uL — AB (ref 1.4–7.7)
NEUTROS PCT: 67.9 % (ref 43.0–77.0)
Platelets: 230 10*3/uL (ref 150.0–400.0)
RBC: 4.28 Mil/uL (ref 4.22–5.81)
RDW: 20.1 % — AB (ref 11.5–15.5)
WBC: 12 10*3/uL — ABNORMAL HIGH (ref 4.0–10.5)

## 2015-11-05 NOTE — Progress Notes (Signed)
Labs better after feraheme My Chart message - he should see me in Nov/Dec

## 2015-11-06 ENCOUNTER — Encounter: Payer: Self-pay | Admitting: Internal Medicine

## 2015-11-06 ENCOUNTER — Ambulatory Visit (HOSPITAL_BASED_OUTPATIENT_CLINIC_OR_DEPARTMENT_OTHER): Payer: Medicare Other | Admitting: Anesthesiology

## 2015-11-06 ENCOUNTER — Ambulatory Visit (HOSPITAL_BASED_OUTPATIENT_CLINIC_OR_DEPARTMENT_OTHER)
Admission: RE | Admit: 2015-11-06 | Discharge: 2015-11-06 | Disposition: A | Discharge status: Home / Self Care | Payer: Medicare Other | Source: Ambulatory Visit | Attending: General Surgery | Admitting: General Surgery

## 2015-11-06 ENCOUNTER — Encounter (HOSPITAL_BASED_OUTPATIENT_CLINIC_OR_DEPARTMENT_OTHER)
Admission: RE | Disposition: A | Discharge status: Home / Self Care | Payer: Self-pay | Source: Ambulatory Visit | Attending: General Surgery

## 2015-11-06 ENCOUNTER — Encounter (HOSPITAL_BASED_OUTPATIENT_CLINIC_OR_DEPARTMENT_OTHER): Payer: Self-pay

## 2015-11-06 DIAGNOSIS — Z87891 Personal history of nicotine dependence: Secondary | ICD-10-CM | POA: Insufficient documentation

## 2015-11-06 DIAGNOSIS — K603 Anal fistula: Secondary | ICD-10-CM | POA: Diagnosis not present

## 2015-11-06 DIAGNOSIS — Z79899 Other long term (current) drug therapy: Secondary | ICD-10-CM | POA: Diagnosis not present

## 2015-11-06 DIAGNOSIS — I10 Essential (primary) hypertension: Secondary | ICD-10-CM | POA: Insufficient documentation

## 2015-11-06 DIAGNOSIS — M1990 Unspecified osteoarthritis, unspecified site: Secondary | ICD-10-CM | POA: Insufficient documentation

## 2015-11-06 DIAGNOSIS — K602 Anal fissure, unspecified: Secondary | ICD-10-CM

## 2015-11-06 DIAGNOSIS — Z7951 Long term (current) use of inhaled steroids: Secondary | ICD-10-CM | POA: Diagnosis not present

## 2015-11-06 DIAGNOSIS — Z7952 Long term (current) use of systemic steroids: Secondary | ICD-10-CM | POA: Insufficient documentation

## 2015-11-06 DIAGNOSIS — K601 Chronic anal fissure: Secondary | ICD-10-CM | POA: Diagnosis not present

## 2015-11-06 DIAGNOSIS — K50819 Crohn's disease of both small and large intestine with unspecified complications: Secondary | ICD-10-CM

## 2015-11-06 DIAGNOSIS — K5090 Crohn's disease, unspecified, without complications: Secondary | ICD-10-CM | POA: Insufficient documentation

## 2015-11-06 HISTORY — DX: Personal history of suicidal behavior: Z91.51

## 2015-11-06 HISTORY — DX: Personal history of other malignant neoplasm of skin: Z85.828

## 2015-11-06 HISTORY — DX: Major depressive disorder, recurrent, unspecified: F33.9

## 2015-11-06 HISTORY — DX: Iron deficiency anemia secondary to blood loss (chronic): D50.0

## 2015-11-06 HISTORY — DX: Personal history of traumatic brain injury: Z87.820

## 2015-11-06 HISTORY — DX: Reserved for inherently not codable concepts without codable children: IMO0001

## 2015-11-06 HISTORY — DX: Family history of other specified conditions: Z84.89

## 2015-11-06 HISTORY — DX: Other seasonal allergic rhinitis: J30.2

## 2015-11-06 HISTORY — PX: PLACEMENT OF SETON: SHX6029

## 2015-11-06 HISTORY — DX: Malabsorption due to intolerance, not elsewhere classified: K90.49

## 2015-11-06 HISTORY — DX: Functional diarrhea: K59.1

## 2015-11-06 HISTORY — DX: Intestinal malabsorption, unspecified: K90.9

## 2015-11-06 HISTORY — DX: Personal history of self-harm: Z91.5

## 2015-11-06 HISTORY — DX: Other specified postprocedural states: Z98.890

## 2015-11-06 HISTORY — DX: Anal fistula: K60.3

## 2015-11-06 HISTORY — DX: Anal fistula, unspecified: K60.30

## 2015-11-06 LAB — POCT HEMOGLOBIN-HEMACUE: HEMOGLOBIN: 13 g/dL (ref 13.0–17.0)

## 2015-11-06 SURGERY — EXAM UNDER ANESTHESIA
Anesthesia: Monitor Anesthesia Care | Site: Buttocks

## 2015-11-06 MED ORDER — MIDAZOLAM HCL 2 MG/2ML IJ SOLN
INTRAMUSCULAR | Status: AC
  Filled 2015-11-06: qty 2

## 2015-11-06 MED ORDER — FENTANYL CITRATE (PF) 100 MCG/2ML IJ SOLN
INTRAMUSCULAR | Status: AC
  Filled 2015-11-06: qty 2

## 2015-11-06 MED ORDER — PROPOFOL 10 MG/ML IV BOLUS
INTRAVENOUS | Status: AC
  Filled 2015-11-06: qty 40

## 2015-11-06 MED ORDER — CHLORHEXIDINE GLUCONATE CLOTH 2 % EX PADS
6.0000 | MEDICATED_PAD | Freq: Once | CUTANEOUS | Status: DC
  Filled 2015-11-06: qty 6

## 2015-11-06 MED ORDER — BUPIVACAINE-EPINEPHRINE 0.5% -1:200000 IJ SOLN
INTRAMUSCULAR | Status: DC | PRN
  Administered 2015-11-06: 30 mL

## 2015-11-06 MED ORDER — ONDANSETRON HCL 4 MG/2ML IJ SOLN
4.0000 mg | Freq: Once | INTRAMUSCULAR | Status: DC | PRN
  Filled 2015-11-06: qty 2

## 2015-11-06 MED ORDER — LIDOCAINE 5 % EX OINT
TOPICAL_OINTMENT | CUTANEOUS | Status: DC | PRN
  Administered 2015-11-06: 1

## 2015-11-06 MED ORDER — HYDROMORPHONE HCL 1 MG/ML IJ SOLN
0.2500 mg | INTRAMUSCULAR | Status: DC | PRN
  Filled 2015-11-06: qty 1

## 2015-11-06 MED ORDER — FENTANYL CITRATE (PF) 100 MCG/2ML IJ SOLN
INTRAMUSCULAR | Status: DC | PRN
  Administered 2015-11-06: 12.5 ug via INTRAVENOUS
  Administered 2015-11-06: 50 ug via INTRAVENOUS
  Administered 2015-11-06: 25 ug via INTRAVENOUS
  Administered 2015-11-06: 12.5 ug via INTRAVENOUS

## 2015-11-06 MED ORDER — MEPERIDINE HCL 25 MG/ML IJ SOLN
6.2500 mg | INTRAMUSCULAR | Status: DC | PRN
  Filled 2015-11-06: qty 1

## 2015-11-06 MED ORDER — PROPOFOL 500 MG/50ML IV EMUL
INTRAVENOUS | Status: DC | PRN
  Administered 2015-11-06: 100 ug/kg/min via INTRAVENOUS

## 2015-11-06 MED ORDER — LIDOCAINE 2% (20 MG/ML) 5 ML SYRINGE
INTRAMUSCULAR | Status: AC
  Filled 2015-11-06: qty 5

## 2015-11-06 MED ORDER — ONDANSETRON HCL 4 MG/2ML IJ SOLN
INTRAMUSCULAR | Status: DC | PRN
  Administered 2015-11-06: 4 mg via INTRAVENOUS

## 2015-11-06 MED ORDER — HYDROCODONE-ACETAMINOPHEN 5-325 MG PO TABS: 1.0000 | ORAL_TABLET | Freq: Four times a day (QID) | ORAL | 0 refills | Status: DC | PRN

## 2015-11-06 MED ORDER — LACTATED RINGERS IV SOLN
INTRAVENOUS | Status: DC
  Administered 2015-11-06: 08:00:00 via INTRAVENOUS
  Filled 2015-11-06: qty 1000

## 2015-11-06 MED ORDER — ONDANSETRON HCL 4 MG/2ML IJ SOLN
INTRAMUSCULAR | Status: AC
  Filled 2015-11-06: qty 2

## 2015-11-06 MED ORDER — MIDAZOLAM HCL 5 MG/5ML IJ SOLN
INTRAMUSCULAR | Status: DC | PRN
  Administered 2015-11-06: 2 mg via INTRAVENOUS

## 2015-11-06 MED ORDER — PROPOFOL 10 MG/ML IV BOLUS
INTRAVENOUS | Status: DC | PRN
  Administered 2015-11-06 (×3): 20 mg via INTRAVENOUS

## 2015-11-06 MED ORDER — LIDOCAINE HCL (CARDIAC) 20 MG/ML IV SOLN
INTRAVENOUS | Status: DC | PRN
  Administered 2015-11-06: 60 mg via INTRAVENOUS

## 2015-11-06 SURGICAL SUPPLY — 56 items
BENZOIN TINCTURE PRP APPL 2/3 (GAUZE/BANDAGES/DRESSINGS) ×2 IMPLANT
BLADE HEX COATED 2.75 (ELECTRODE) ×2 IMPLANT
BLADE SURG 10 STRL SS (BLADE) IMPLANT
BLADE SURG 15 STRL LF DISP TIS (BLADE) ×1 IMPLANT
BLADE SURG 15 STRL SS (BLADE) ×1
BRIEF STRETCH FOR OB PAD LRG (UNDERPADS AND DIAPERS) ×2 IMPLANT
CANISTER SUCTION 2500CC (MISCELLANEOUS) ×2 IMPLANT
CATH IV 18G (IV SOLUTION) ×2 IMPLANT
COVER BACK TABLE 60X90IN (DRAPES) ×2 IMPLANT
COVER MAYO STAND STRL (DRAPES) ×2 IMPLANT
DECANTER SPIKE VIAL GLASS SM (MISCELLANEOUS) IMPLANT
DRAPE LAPAROTOMY 100X72 PEDS (DRAPES) ×2 IMPLANT
DRAPE LG THREE QUARTER DISP (DRAPES) IMPLANT
DRAPE UNDERBUTTOCKS STRL (DRAPE) IMPLANT
DRAPE UTILITY XL STRL (DRAPES) IMPLANT
DRSG PAD ABDOMINAL 8X10 ST (GAUZE/BANDAGES/DRESSINGS) ×2 IMPLANT
ELECT BLADE 6.5 .24CM SHAFT (ELECTRODE) IMPLANT
ELECT REM PT RETURN 9FT ADLT (ELECTROSURGICAL) ×2
ELECTRODE REM PT RTRN 9FT ADLT (ELECTROSURGICAL) ×1 IMPLANT
GAUZE SPONGE 4X4 12PLY STRL (GAUZE/BANDAGES/DRESSINGS) ×2 IMPLANT
GAUZE SPONGE 4X4 16PLY XRAY LF (GAUZE/BANDAGES/DRESSINGS) IMPLANT
GAUZE VASELINE 3X9 (GAUZE/BANDAGES/DRESSINGS) IMPLANT
GLOVE BIO SURGEON STRL SZ 6.5 (GLOVE) ×2 IMPLANT
GLOVE INDICATOR 7.0 STRL GRN (GLOVE) ×2 IMPLANT
GOWN STRL REUS W/ TWL LRG LVL3 (GOWN DISPOSABLE) ×1 IMPLANT
GOWN STRL REUS W/ TWL XL LVL3 (GOWN DISPOSABLE) ×1 IMPLANT
GOWN STRL REUS W/TWL 2XL LVL3 (GOWN DISPOSABLE) ×2 IMPLANT
GOWN STRL REUS W/TWL LRG LVL3 (GOWN DISPOSABLE) ×1
GOWN STRL REUS W/TWL XL LVL3 (GOWN DISPOSABLE) ×1
HEMOSTAT SURGICEL 2X14 (HEMOSTASIS) IMPLANT
HYDROGEN PEROXIDE 16OZ (MISCELLANEOUS) IMPLANT
KIT ROOM TURNOVER WOR (KITS) ×2 IMPLANT
LEGGING LITHOTOMY PAIR STRL (DRAPES) IMPLANT
LOOP VESSEL MAXI BLUE (MISCELLANEOUS) ×2 IMPLANT
NDL SAFETY ECLIPSE 18X1.5 (NEEDLE) IMPLANT
NEEDLE HYPO 18GX1.5 SHARP (NEEDLE)
NEEDLE HYPO 25X1 1.5 SAFETY (NEEDLE) ×2 IMPLANT
NS IRRIG 500ML POUR BTL (IV SOLUTION) ×2 IMPLANT
PACK BASIN DAY SURGERY FS (CUSTOM PROCEDURE TRAY) ×2 IMPLANT
PAD ABD 8X10 STRL (GAUZE/BANDAGES/DRESSINGS) ×2 IMPLANT
PAD ARMBOARD 7.5X6 YLW CONV (MISCELLANEOUS) ×2 IMPLANT
PENCIL BUTTON HOLSTER BLD 10FT (ELECTRODE) ×2 IMPLANT
SPONGE GAUZE 4X4 12PLY STER LF (GAUZE/BANDAGES/DRESSINGS) IMPLANT
SPONGE SURGIFOAM ABS GEL 12-7 (HEMOSTASIS) IMPLANT
SUT CHROMIC 2 0 SH (SUTURE) IMPLANT
SUT CHROMIC 3 0 SH 27 (SUTURE) IMPLANT
SUT ETHIBOND 0 (SUTURE) ×2 IMPLANT
SUT VIC AB 2-0 SH 27 (SUTURE)
SUT VIC AB 2-0 SH 27XBRD (SUTURE) IMPLANT
SUT VIC AB 4-0 P-3 18XBRD (SUTURE) IMPLANT
SUT VIC AB 4-0 P3 18 (SUTURE)
SYR CONTROL 10ML LL (SYRINGE) ×2 IMPLANT
TOWEL OR 17X24 6PK STRL BLUE (TOWEL DISPOSABLE) ×4 IMPLANT
TRAY DSU PREP LF (CUSTOM PROCEDURE TRAY) ×2 IMPLANT
TUBE CONNECTING 12X1/4 (SUCTIONS) ×2 IMPLANT
YANKAUER SUCT BULB TIP NO VENT (SUCTIONS) ×2 IMPLANT

## 2015-11-06 NOTE — Anesthesia Preprocedure Evaluation (Signed)
Anesthesia Evaluation  Patient identified by MRN, date of birth, ID band Patient awake    Reviewed: Allergy & Precautions, NPO status , Patient's Chart, lab work & pertinent test results  Airway Mallampati: I  TM Distance: >3 FB Neck ROM: Full    Dental   Pulmonary former smoker,    Pulmonary exam normal        Cardiovascular hypertension, Pt. on medications Normal cardiovascular exam     Neuro/Psych Anxiety Depression    GI/Hepatic H/O Crohns Disease on Prednisone 92m per day   Endo/Other    Renal/GU      Musculoskeletal   Abdominal   Peds  Hematology   Anesthesia Other Findings   Reproductive/Obstetrics                             Anesthesia Physical Anesthesia Plan  ASA: II  Anesthesia Plan: MAC   Post-op Pain Management:    Induction: Intravenous  Airway Management Planned:   Additional Equipment:   Intra-op Plan:   Post-operative Plan:   Informed Consent: I have reviewed the patients History and Physical, chart, labs and discussed the procedure including the risks, benefits and alternatives for the proposed anesthesia with the patient or authorized representative who has indicated his/her understanding and acceptance.     Plan Discussed with: CRNA and Surgeon  Anesthesia Plan Comments:         Anesthesia Quick Evaluation

## 2015-11-06 NOTE — Discharge Instructions (Addendum)
Beginning the day after surgery:  You may sit in a tub of warm water 2-3 times a day to relieve discomfort.  Eat a regular diet high in fiber.  Avoid foods that give you constipation or diarrhea.  Avoid foods that are difficult to digest, such as seeds, nuts, corn or popcorn.  Do not go any longer than 2 days without a bowel movement.  You may take a dose of Milk of Magnesia if you become constipated.    Drink 6-8 glasses of water daily.  Walking is encouraged.  Avoid strenuous activity and heavy lifting for one month after surgery.    do not sit on a donut ring.  Okay to sit on soft pillow. Call the office if you have any questions or concerns.  Call immediately if you develop:    Excessive rectal bleeding (more than a cup or passing large clots)  Increased discomfort  Fever greater than 100 F  Difficulty urinating   Post Anesthesia Home Care Instructions  Activity: Get plenty of rest for the remainder of the day. A responsible adult should stay with you for 24 hours following the procedure.  For the next 24 hours, DO NOT: -Drive a car -Paediatric nurse -Drink alcoholic beverages -Take any medication unless instructed by your physician -Make any legal decisions or sign important papers.  Meals: Start with liquid foods such as gelatin or soup. Progress to regular foods as tolerated. Avoid greasy, spicy, heavy foods. If nausea and/or vomiting occur, drink only clear liquids until the nausea and/or vomiting subsides. Call your physician if vomiting continues.  Special Instructions/Symptoms: Your throat may feel dry or sore from the anesthesia or the breathing tube placed in your throat during surgery. If this causes discomfort, gargle with warm salt water. The discomfort should disappear within 24 hours.  If you had a scopolamine patch placed behind your ear for the management of post- operative nausea and/or vomiting:  1. The medication in the patch is effective for 72  hours, after which it should be removed.  Wrap patch in a tissue and discard in the trash. Wash hands thoroughly with soap and water. 2. You may remove the patch earlier than 72 hours if you experience unpleasant side effects which may include dry mouth, dizziness or visual disturbances. 3. Avoid touching the patch. Wash your hands with soap and water after contact with the patch.

## 2015-11-06 NOTE — H&P (View-Only) (Signed)
History of Present Illness [name] MD; 08/31/2200 10:31 AM) The patient is a 50 year old male who presents with anal fistula. 50 year old male with Crohn's disease on Humira and steroids who presents to the office for evaluation of a persistent perirectal infection. He states that his been present for approximately 3-4 weeks. Before that he had a fissure in the same area. He has changed his diet and has reduced his bowel movements to approximately 1-2 a day. He denies any rectal bleeding. He is currently on 30 mg of prednisone a day.   Other Problems Elbert Ewings, CMA; 10/07/2015 10:23 AM) Alcohol Abuse Anxiety Disorder Arthritis Back Pain Cirrhosis Of Liver Crohn's Disease Depression Hemorrhoids Hepatitis Other disease, cancer, significant illness  Past Surgical History Elbert Ewings, CMA; 10/07/2015 10:23 AM) Cataract Surgery Bilateral. Colon Polyp Removal - Colonoscopy Foot Surgery Right.  Diagnostic Studies History Elbert Ewings, [place]; 10/07/2015 10:23 AM) Colonoscopy within last year  Allergies Elbert Ewings, CMA; 10/07/2015 10:24 AM) No Known Drug Allergies 10/07/2015  Medication History Elbert Ewings, CMA; 10/07/2015 10:28 AM) Ascorbic Acid CR (1000MG Tablet ER, Oral) Active. Calcium-Vitamin D (900MG Capsule, Oral) Active. ClonazePAM (0.5MG Tablet, Oral) Active. DILTIAZEM GEL (2% Gel, External) Active. Fluticasone Propionate (50MCG/ACT Suspension, Nasal) Active. Humira Pen (40MG/0.8ML Pen-inj Kit, Subcutaneous) Active. Hydrocodone-Acetaminophen (5-325MG Tablet, Oral) Active. LevoFLOXacin (500MG Tablet, Oral) Active. MetroNIDAZOLE (500MG Tablet, Oral) Active. Bactroban (2% Cream, External) Active. PredniSONE (20MG Tablet, Oral) Active. Mirtazapine (30MG Tablet, Oral) Active. Medications Reconciled  Social History Elbert Ewings, [place]; 10/07/2015 10:23 AM) Alcohol use Remotely quit alcohol use. Caffeine use Tea. Illicit drug use Remotely quit  drug use. Tobacco use Former smoker.  Family History Elbert Ewings, [place]; 10/07/2015 10:23 AM) Arthritis Father, Mother. Breast Cancer Mother. Depression Brother, Father, Mother. Heart Disease Father. Heart disease in male family member before age 75 Hypertension Father. Ischemic Bowel Disease Mother. Thyroid problems Father.     Review of Systems Elbert Ewings CMA; 10/07/2015 10:23 AM) General Present- Chills, Fatigue and Weight Loss. Not Present- Appetite Loss, Fever, Night Sweats and Weight Gain. Skin Present- Dryness. Not Present- Change in Wart/Mole, Hives, Jaundice, New Lesions, Non-Healing Wounds, Rash and Ulcer. HEENT Present- Hearing Loss, Hoarseness, Seasonal Allergies, Visual Disturbances and Wears glasses/contact lenses. Not Present- Earache, Nose Bleed, Oral Ulcers, Ringing in the Ears, Sinus Pain, Sore Throat and Yellow Eyes. Respiratory Present- Difficulty Breathing. Not Present- Bloody sputum, Chronic Cough, Snoring and Wheezing. Breast Not Present- Breast Mass, Breast Pain, Nipple Discharge and Skin Changes. Cardiovascular Present- Shortness of Breath and Swelling of Extremities. Not Present- Chest Pain, Difficulty Breathing Lying Down, Leg Cramps, Palpitations and Rapid Heart Rate. Gastrointestinal Present- Abdominal Pain, Bloating, Bloody Stool, Chronic diarrhea, Difficulty Swallowing, Excessive gas, Hemorrhoids, Indigestion, Nausea and Rectal Pain. Not Present- Change in Bowel Habits, Constipation, Gets full quickly at meals and Vomiting. Male Genitourinary Not Present- Blood in Urine, Change in Urinary Stream, Frequency, Impotence, Nocturia, Painful Urination, Urgency and Urine Leakage. Musculoskeletal Present- Back Pain, Joint Pain, Joint Stiffness, Muscle Pain, Muscle Weakness and Swelling of Extremities. Neurological Present- Decreased Memory, Headaches and Tingling. Not Present- Fainting, Numbness, Seizures, Tremor, Trouble walking and Weakness. Psychiatric  Present- Anxiety, Change in Sleep Pattern and Depression. Not Present- Bipolar, Fearful and Frequent crying. Endocrine Not Present- Cold Intolerance, Excessive Hunger, Hair Changes, Heat Intolerance, Hot flashes and New Diabetes. Hematology Present- Easy Bruising. Not Present- Blood Thinners, Excessive bleeding, Gland problems, HIV and Persistent Infections.  Vitals Elbert Ewings CMA; 10/07/2015 10:27 AM) 10/07/2015 10:27 AM Weight: 202 lb Height: 73in  Body Surface Area: 2.16 m Body Mass Index: 26.65 kg/m  Temp.: 97.8F(Temporal)  Pulse: 75 (Regular)  BP: 128/70 (Sitting, Left Arm, Standard)      Physical Exam [name] MD; 05/06/1694 10:37 AM)  General Mental Status-Alert. General Appearance-Not in acute distress. Build & Nutrition-Well nourished. Posture-Normal posture. Gait-Normal.  Head and Neck Head-normocephalic, atraumatic with no lesions or palpable masses. Trachea-midline.  Chest and Lung Exam Chest and lung exam reveals -on auscultation, normal breath sounds, no adventitious sounds and normal vocal resonance.  Cardiovascular Cardiovascular examination reveals -normal heart sounds, regular rate and rhythm with no murmurs.  Abdomen Inspection Inspection of the abdomen reveals - No Hernias. Palpation/Percussion Palpation and Percussion of the abdomen reveal - Soft, Non Tender, No Rigidity (guarding), No hepatosplenomegaly and No Palpable abdominal masses.  Rectal Anorectal Exam External - Note: Posterior midline external opening noted, fistula probe passes with ease to the level of sphincter.  Neurologic Neurologic evaluation reveals -alert and oriented x 3 with no impairment of recent or remote memory, normal attention span and ability to concentrate, normal sensation and normal coordination.  Musculoskeletal Normal Exam - Bilateral-Upper Extremity Strength Normal and Lower Extremity Strength Normal.    Assessment & Plan  [name] MD; 09/06/9379 10:38 AM)  ANAL FISTULA (K60.3) Impression: 50 year old male with Crohn's disease and new perirectal abscess. This is most likely a fistula forming. I have recommended seton placement and to continue his biologic treatment. We will leave the seton in for several months and allow the area to heal. Once this happens, the seton can be removed in the office. We discussed that this has the highest success rate for people with Crohn's disease as it does not involve injuring any of his sphincter muscle.

## 2015-11-06 NOTE — Anesthesia Procedure Notes (Signed)
Procedure Name: MAC Performed by: Denna Haggard D Pre-anesthesia Checklist: Patient identified, Emergency Drugs available, Suction available, Patient being monitored and Timeout performed Patient Re-evaluated:Patient Re-evaluated prior to inductionOxygen Delivery Method: Simple face mask Placement Confirmation: positive ETCO2 and breath sounds checked- equal and bilateral

## 2015-11-06 NOTE — Op Note (Signed)
11/06/2015  9:32 AM  PATIENT:  [name]  50 y.o. male  Patient Care Team: Lona Kettle, MD as PCP - General (Family Medicine) [name], MD as Attending Physician (Gastroenterology) Elam Dutch, MD as Attending Physician (Vascular Surgery)  PRE-OPERATIVE DIAGNOSIS:  Anal fistula  POST-OPERATIVE DIAGNOSIS:  Anal fistula with fissure  PROCEDURE:   EXAM UNDER ANESTHESIA PLACEMENT OF SETON   Surgeon(s): [name], MD  ASSISTANT: none   ANESTHESIA:   local and MAC  SPECIMEN:  No Specimen  DISPOSITION OF SPECIMEN:  N/A  COUNTS:  YES  PLAN OF CARE: Discharge to home after PACU  PATIENT DISPOSITION:  PACU - hemodynamically stable.  INDICATION: 50 y.o. M with chrohn's disease and chronically draining perianal wound   OR FINDINGS: posterior midline anal fissure with small skin bridge.  Posterior midline fissure involving internal sphincter.  DESCRIPTION: the patient was identified in the preoperative holding area and taken to the OR where they were laid on the operating room table.  MAC anesthesia was induced without difficulty. The patient was then positioned in prone jackknife position with buttocks gently taped apart.  The patient was then prepped and draped in usual sterile fashion.  SCDs were noted to be in place prior to the initiation of anesthesia. A surgical timeout was performed indicating the correct patient, procedure, positioning and need for preoperative antibiotics.  A rectal block was performed using Marcaine with epinephrine.    I began with a digital rectal exam.  There were no masses.  There was little edema in the rectal wall.  I then placed a Hill-Ferguson anoscope into the anal canal and evaluated this completely.  There was a posterior midline fissure that was noted with a small skin bridge over top. There was an obvious posterior midline external opening. An S shaped fistula probe was then placed into the external opening and this exited  internally just proximal to the fissure site. The skin bridge was incised. This was then tacked down to the muscle layer using interrupted 3-0 chromic sutures to allow for this to heal properly. I then placed an Ethibond suture on the end of the probe and brought this out through the anal canal and external opening. I pulled a vessel loop through this fistula tract and secured it into a loop using 3 interrupted Ethibond sutures. This concluded the case. The patient was awakened from anesthesia and sent to the postanesthesia care unit in stable condition. All counts were correct per operating room staff.

## 2015-11-06 NOTE — Transfer of Care (Signed)
Immediate Anesthesia Transfer of Care Note  Patient: [name]  Procedure(s) Performed: Procedure(s) (LRB): EXAM UNDER ANESTHESIA (N/A) PLACEMENT OF SETON (N/A)  Patient Location: PACU  Anesthesia Type: MAC  Level of Consciousness: awake, alert , oriented and patient cooperative  Airway & Oxygen Therapy: Patient Spontanous Breathing and Patient connected to face mask oxygen  Post-op Assessment: Report given to PACU RN and Post -op Vital signs reviewed and stable  Post vital signs: Reviewed and stable  Complications: No apparent anesthesia complications

## 2015-11-06 NOTE — Interval H&P Note (Signed)
History and Physical Interval Note:  11/06/2015 8:42 AM  [name]  has presented today for surgery, with the diagnosis of Anal fistula  The various methods of treatment have been discussed with the patient and family. After consideration of risks, benefits and other options for treatment, the patient has consented to  Procedure(s): EXAM UNDER ANESTHESIA (N/A) PLACEMENT OF SETON (N/A) as a surgical intervention .  The patient's history has been reviewed, patient examined, no change in status, stable for surgery.  I have reviewed the patient's chart and labs.  Questions were answered to the patient's satisfaction.     Rosario Adie, MD  [facility]

## 2015-11-06 NOTE — Anesthesia Postprocedure Evaluation (Signed)
Anesthesia Post Note  Patient: [name]  Procedure(s) Performed: Procedure(s) (LRB): EXAM UNDER ANESTHESIA (N/A) PLACEMENT OF SETON (N/A)  Patient location during evaluation: PACU Anesthesia Type: MAC Level of consciousness: awake and alert Pain management: pain level controlled Vital Signs Assessment: post-procedure vital signs reviewed and stable Respiratory status: spontaneous breathing, nonlabored ventilation, respiratory function stable and patient connected to nasal cannula oxygen Cardiovascular status: stable and blood pressure returned to baseline Anesthetic complications: no    Last Vitals:  Vitals:   11/06/15 1015 11/06/15 1115  BP: (!) 141/84 (!) 155/81  Pulse: 64 63  Resp: 18 16  Temp:  36.6 C    Last Pain:  Vitals:   11/06/15 0746  TempSrc: Oral                 Aubriel Khanna DAVID

## 2015-11-07 ENCOUNTER — Encounter (HOSPITAL_BASED_OUTPATIENT_CLINIC_OR_DEPARTMENT_OTHER): Payer: Self-pay | Admitting: General Surgery

## 2015-12-01 ENCOUNTER — Encounter: Payer: Self-pay | Admitting: Internal Medicine

## 2015-12-01 DIAGNOSIS — K50819 Crohn's disease of both small and large intestine with unspecified complications: Secondary | ICD-10-CM

## 2015-12-01 DIAGNOSIS — K602 Anal fissure, unspecified: Secondary | ICD-10-CM

## 2015-12-01 MED ORDER — HYDROCODONE-ACETAMINOPHEN 5-325 MG PO TABS: 1.0000 | ORAL_TABLET | Freq: Four times a day (QID) | ORAL | 0 refills | Status: DC | PRN

## 2015-12-02 ENCOUNTER — Telehealth: Payer: Self-pay | Admitting: Internal Medicine

## 2015-12-02 NOTE — Telephone Encounter (Signed)
Patient notified of Dr. Celesta Aver response.  He will com in on 02/05/16

## 2015-12-02 NOTE — Telephone Encounter (Signed)
November or December

## 2015-12-02 NOTE — Telephone Encounter (Signed)
When did you want to see him back?

## 2015-12-08 DIAGNOSIS — F411 Generalized anxiety disorder: Secondary | ICD-10-CM | POA: Diagnosis not present

## 2015-12-08 DIAGNOSIS — G4700 Insomnia, unspecified: Secondary | ICD-10-CM | POA: Diagnosis not present

## 2015-12-08 DIAGNOSIS — Z23 Encounter for immunization: Secondary | ICD-10-CM | POA: Diagnosis not present

## 2015-12-08 DIAGNOSIS — F322 Major depressive disorder, single episode, severe without psychotic features: Secondary | ICD-10-CM | POA: Diagnosis not present

## 2015-12-19 ENCOUNTER — Encounter: Payer: Self-pay | Admitting: Internal Medicine

## 2015-12-19 MED ORDER — LEVOFLOXACIN 500 MG PO TABS: 500.0000 mg | ORAL_TABLET | Freq: Every day | ORAL | 0 refills | Status: DC

## 2015-12-19 NOTE — Telephone Encounter (Signed)
Discussed with Nicoletta Ba PA okay for refill of levaquin. Please have patient call with an update next week.

## 2015-12-29 ENCOUNTER — Encounter: Payer: Self-pay | Admitting: Internal Medicine

## 2015-12-29 ENCOUNTER — Other Ambulatory Visit: Payer: Self-pay | Admitting: Internal Medicine

## 2015-12-29 DIAGNOSIS — K50819 Crohn's disease of both small and large intestine with unspecified complications: Secondary | ICD-10-CM

## 2015-12-29 DIAGNOSIS — K602 Anal fissure, unspecified: Secondary | ICD-10-CM

## 2015-12-29 MED ORDER — HYDROCODONE-ACETAMINOPHEN 5-325 MG PO TABS: 1.0000 | ORAL_TABLET | Freq: Four times a day (QID) | ORAL | 0 refills | Status: DC | PRN

## 2016-01-01 DIAGNOSIS — M9903 Segmental and somatic dysfunction of lumbar region: Secondary | ICD-10-CM | POA: Diagnosis not present

## 2016-01-01 DIAGNOSIS — M9901 Segmental and somatic dysfunction of cervical region: Secondary | ICD-10-CM | POA: Diagnosis not present

## 2016-01-01 DIAGNOSIS — M5136 Other intervertebral disc degeneration, lumbar region: Secondary | ICD-10-CM | POA: Diagnosis not present

## 2016-01-01 DIAGNOSIS — M5411 Radiculopathy, occipito-atlanto-axial region: Secondary | ICD-10-CM | POA: Diagnosis not present

## 2016-01-05 DIAGNOSIS — M9903 Segmental and somatic dysfunction of lumbar region: Secondary | ICD-10-CM | POA: Diagnosis not present

## 2016-01-05 DIAGNOSIS — M9901 Segmental and somatic dysfunction of cervical region: Secondary | ICD-10-CM | POA: Diagnosis not present

## 2016-01-05 DIAGNOSIS — M5411 Radiculopathy, occipito-atlanto-axial region: Secondary | ICD-10-CM | POA: Diagnosis not present

## 2016-01-05 DIAGNOSIS — M5136 Other intervertebral disc degeneration, lumbar region: Secondary | ICD-10-CM | POA: Diagnosis not present

## 2016-01-06 DIAGNOSIS — M9903 Segmental and somatic dysfunction of lumbar region: Secondary | ICD-10-CM | POA: Diagnosis not present

## 2016-01-06 DIAGNOSIS — M5411 Radiculopathy, occipito-atlanto-axial region: Secondary | ICD-10-CM | POA: Diagnosis not present

## 2016-01-06 DIAGNOSIS — M9901 Segmental and somatic dysfunction of cervical region: Secondary | ICD-10-CM | POA: Diagnosis not present

## 2016-01-06 DIAGNOSIS — M5136 Other intervertebral disc degeneration, lumbar region: Secondary | ICD-10-CM | POA: Diagnosis not present

## 2016-01-07 DIAGNOSIS — M9901 Segmental and somatic dysfunction of cervical region: Secondary | ICD-10-CM | POA: Diagnosis not present

## 2016-01-07 DIAGNOSIS — M5411 Radiculopathy, occipito-atlanto-axial region: Secondary | ICD-10-CM | POA: Diagnosis not present

## 2016-01-07 DIAGNOSIS — M9903 Segmental and somatic dysfunction of lumbar region: Secondary | ICD-10-CM | POA: Diagnosis not present

## 2016-01-07 DIAGNOSIS — M5136 Other intervertebral disc degeneration, lumbar region: Secondary | ICD-10-CM | POA: Diagnosis not present

## 2016-01-12 DIAGNOSIS — M5136 Other intervertebral disc degeneration, lumbar region: Secondary | ICD-10-CM | POA: Diagnosis not present

## 2016-01-12 DIAGNOSIS — M9903 Segmental and somatic dysfunction of lumbar region: Secondary | ICD-10-CM | POA: Diagnosis not present

## 2016-01-12 DIAGNOSIS — M9901 Segmental and somatic dysfunction of cervical region: Secondary | ICD-10-CM | POA: Diagnosis not present

## 2016-01-12 DIAGNOSIS — M5411 Radiculopathy, occipito-atlanto-axial region: Secondary | ICD-10-CM | POA: Diagnosis not present

## 2016-01-13 DIAGNOSIS — M9901 Segmental and somatic dysfunction of cervical region: Secondary | ICD-10-CM | POA: Diagnosis not present

## 2016-01-13 DIAGNOSIS — M5411 Radiculopathy, occipito-atlanto-axial region: Secondary | ICD-10-CM | POA: Diagnosis not present

## 2016-01-13 DIAGNOSIS — M5136 Other intervertebral disc degeneration, lumbar region: Secondary | ICD-10-CM | POA: Diagnosis not present

## 2016-01-13 DIAGNOSIS — M9903 Segmental and somatic dysfunction of lumbar region: Secondary | ICD-10-CM | POA: Diagnosis not present

## 2016-01-14 DIAGNOSIS — M9901 Segmental and somatic dysfunction of cervical region: Secondary | ICD-10-CM | POA: Diagnosis not present

## 2016-01-14 DIAGNOSIS — M9903 Segmental and somatic dysfunction of lumbar region: Secondary | ICD-10-CM | POA: Diagnosis not present

## 2016-01-14 DIAGNOSIS — M5411 Radiculopathy, occipito-atlanto-axial region: Secondary | ICD-10-CM | POA: Diagnosis not present

## 2016-01-14 DIAGNOSIS — M5136 Other intervertebral disc degeneration, lumbar region: Secondary | ICD-10-CM | POA: Diagnosis not present

## 2016-01-19 DIAGNOSIS — M5411 Radiculopathy, occipito-atlanto-axial region: Secondary | ICD-10-CM | POA: Diagnosis not present

## 2016-01-19 DIAGNOSIS — M9903 Segmental and somatic dysfunction of lumbar region: Secondary | ICD-10-CM | POA: Diagnosis not present

## 2016-01-19 DIAGNOSIS — M9901 Segmental and somatic dysfunction of cervical region: Secondary | ICD-10-CM | POA: Diagnosis not present

## 2016-01-19 DIAGNOSIS — M5136 Other intervertebral disc degeneration, lumbar region: Secondary | ICD-10-CM | POA: Diagnosis not present

## 2016-01-20 ENCOUNTER — Other Ambulatory Visit: Payer: Self-pay | Admitting: Internal Medicine

## 2016-01-20 ENCOUNTER — Encounter: Payer: Self-pay | Admitting: Internal Medicine

## 2016-01-20 DIAGNOSIS — K50118 Crohn's disease of large intestine with other complication: Secondary | ICD-10-CM

## 2016-01-20 MED ORDER — METRONIDAZOLE 500 MG PO TABS: 500.0000 mg | ORAL_TABLET | Freq: Two times a day (BID) | ORAL | 0 refills | Status: DC

## 2016-01-20 NOTE — Progress Notes (Signed)
Diarrhea sxs Retretat w/ Metronidoazole

## 2016-01-21 ENCOUNTER — Other Ambulatory Visit: Payer: Self-pay | Admitting: Internal Medicine

## 2016-01-21 ENCOUNTER — Encounter: Payer: Self-pay | Admitting: Internal Medicine

## 2016-01-21 DIAGNOSIS — M9901 Segmental and somatic dysfunction of cervical region: Secondary | ICD-10-CM | POA: Diagnosis not present

## 2016-01-21 DIAGNOSIS — M5136 Other intervertebral disc degeneration, lumbar region: Secondary | ICD-10-CM | POA: Diagnosis not present

## 2016-01-21 DIAGNOSIS — M5411 Radiculopathy, occipito-atlanto-axial region: Secondary | ICD-10-CM | POA: Diagnosis not present

## 2016-01-21 DIAGNOSIS — M9903 Segmental and somatic dysfunction of lumbar region: Secondary | ICD-10-CM | POA: Diagnosis not present

## 2016-01-21 MED ORDER — LEVOFLOXACIN 500 MG PO TABS: 500.0000 mg | ORAL_TABLET | Freq: Every day | ORAL | 0 refills | Status: DC

## 2016-01-26 ENCOUNTER — Other Ambulatory Visit: Payer: Self-pay | Admitting: Internal Medicine

## 2016-01-26 ENCOUNTER — Encounter: Payer: Self-pay | Admitting: Internal Medicine

## 2016-01-26 DIAGNOSIS — M5136 Other intervertebral disc degeneration, lumbar region: Secondary | ICD-10-CM | POA: Diagnosis not present

## 2016-01-26 DIAGNOSIS — M5411 Radiculopathy, occipito-atlanto-axial region: Secondary | ICD-10-CM | POA: Diagnosis not present

## 2016-01-26 DIAGNOSIS — M9901 Segmental and somatic dysfunction of cervical region: Secondary | ICD-10-CM | POA: Diagnosis not present

## 2016-01-26 DIAGNOSIS — D500 Iron deficiency anemia secondary to blood loss (chronic): Secondary | ICD-10-CM

## 2016-01-26 DIAGNOSIS — M9903 Segmental and somatic dysfunction of lumbar region: Secondary | ICD-10-CM | POA: Diagnosis not present

## 2016-01-26 DIAGNOSIS — K754 Autoimmune hepatitis: Secondary | ICD-10-CM

## 2016-01-26 NOTE — Assessment & Plan Note (Signed)
CMET today

## 2016-01-26 NOTE — Assessment & Plan Note (Signed)
Labs today

## 2016-01-27 ENCOUNTER — Other Ambulatory Visit (INDEPENDENT_AMBULATORY_CARE_PROVIDER_SITE_OTHER): Payer: Medicare Other

## 2016-01-27 DIAGNOSIS — K754 Autoimmune hepatitis: Secondary | ICD-10-CM

## 2016-01-27 DIAGNOSIS — D500 Iron deficiency anemia secondary to blood loss (chronic): Secondary | ICD-10-CM | POA: Diagnosis not present

## 2016-01-27 LAB — CBC WITH DIFFERENTIAL/PLATELET
BASOS ABS: 0 10*3/uL (ref 0.0–0.1)
Basophils Relative: 0.3 % (ref 0.0–3.0)
Eosinophils Absolute: 0.1 10*3/uL (ref 0.0–0.7)
Eosinophils Relative: 0.7 % (ref 0.0–5.0)
HEMATOCRIT: 29.1 % — AB (ref 39.0–52.0)
Hemoglobin: 9.1 g/dL — ABNORMAL LOW (ref 13.0–17.0)
LYMPHS PCT: 23.2 % (ref 12.0–46.0)
Lymphs Abs: 3.3 10*3/uL (ref 0.7–4.0)
MCHC: 31.3 g/dL (ref 30.0–36.0)
MCV: 70.7 fl — AB (ref 78.0–100.0)
MONOS PCT: 13.1 % — AB (ref 3.0–12.0)
Monocytes Absolute: 1.9 10*3/uL — ABNORMAL HIGH (ref 0.1–1.0)
Neutro Abs: 8.9 10*3/uL — ABNORMAL HIGH (ref 1.4–7.7)
Neutrophils Relative %: 62.7 % (ref 43.0–77.0)
Platelets: 340 10*3/uL (ref 150.0–400.0)
RBC: 4.12 Mil/uL — AB (ref 4.22–5.81)
RDW: 20.2 % — ABNORMAL HIGH (ref 11.5–15.5)
WBC: 14.3 10*3/uL — AB (ref 4.0–10.5)

## 2016-01-27 LAB — COMPREHENSIVE METABOLIC PANEL
ALK PHOS: 68 U/L (ref 39–117)
ALT: 29 U/L (ref 0–53)
AST: 16 U/L (ref 0–37)
Albumin: 3.2 g/dL — ABNORMAL LOW (ref 3.5–5.2)
BILIRUBIN TOTAL: 0.3 mg/dL (ref 0.2–1.2)
BUN: 21 mg/dL (ref 6–23)
CALCIUM: 8.7 mg/dL (ref 8.4–10.5)
CO2: 28 mEq/L (ref 19–32)
CREATININE: 1.07 mg/dL (ref 0.40–1.50)
Chloride: 103 mEq/L (ref 96–112)
GFR: 76.92 mL/min (ref >60.00)
GLUCOSE: 72 mg/dL (ref 70–99)
Potassium: 3.3 mEq/L — ABNORMAL LOW (ref 3.5–5.1)
Sodium: 139 mEq/L (ref 135–145)
TOTAL PROTEIN: 5.7 g/dL — AB (ref 6.0–8.3)

## 2016-01-27 LAB — FERRITIN: Ferritin: 8.9 ng/mL — ABNORMAL LOW (ref 22.0–322.0)

## 2016-01-27 NOTE — Progress Notes (Signed)
Ferritin low again and iron-deficiency anemia is back Please order feraheme x 2 injections  Needs CBC and ferritin 1 month after last feraheme then

## 2016-01-28 ENCOUNTER — Other Ambulatory Visit: Payer: Self-pay

## 2016-01-28 DIAGNOSIS — M5136 Other intervertebral disc degeneration, lumbar region: Secondary | ICD-10-CM | POA: Diagnosis not present

## 2016-01-28 DIAGNOSIS — M5411 Radiculopathy, occipito-atlanto-axial region: Secondary | ICD-10-CM | POA: Diagnosis not present

## 2016-01-28 DIAGNOSIS — M9903 Segmental and somatic dysfunction of lumbar region: Secondary | ICD-10-CM | POA: Diagnosis not present

## 2016-01-28 DIAGNOSIS — D508 Other iron deficiency anemias: Secondary | ICD-10-CM

## 2016-01-28 DIAGNOSIS — M9901 Segmental and somatic dysfunction of cervical region: Secondary | ICD-10-CM | POA: Diagnosis not present

## 2016-01-29 ENCOUNTER — Encounter: Payer: Self-pay | Admitting: Internal Medicine

## 2016-01-30 ENCOUNTER — Other Ambulatory Visit: Payer: Self-pay | Admitting: Internal Medicine

## 2016-01-30 DIAGNOSIS — K50819 Crohn's disease of both small and large intestine with unspecified complications: Secondary | ICD-10-CM

## 2016-01-30 DIAGNOSIS — K602 Anal fissure, unspecified: Secondary | ICD-10-CM

## 2016-01-30 MED ORDER — HYDROCODONE-ACETAMINOPHEN 5-325 MG PO TABS: 1.0000 | ORAL_TABLET | Freq: Four times a day (QID) | ORAL | 0 refills | Status: DC | PRN

## 2016-02-02 ENCOUNTER — Encounter: Payer: Self-pay | Admitting: Internal Medicine

## 2016-02-02 DIAGNOSIS — M9903 Segmental and somatic dysfunction of lumbar region: Secondary | ICD-10-CM | POA: Diagnosis not present

## 2016-02-02 DIAGNOSIS — M5136 Other intervertebral disc degeneration, lumbar region: Secondary | ICD-10-CM | POA: Diagnosis not present

## 2016-02-02 DIAGNOSIS — M9901 Segmental and somatic dysfunction of cervical region: Secondary | ICD-10-CM | POA: Diagnosis not present

## 2016-02-02 DIAGNOSIS — M5411 Radiculopathy, occipito-atlanto-axial region: Secondary | ICD-10-CM | POA: Diagnosis not present

## 2016-02-04 ENCOUNTER — Encounter: Payer: Self-pay | Admitting: Internal Medicine

## 2016-02-05 ENCOUNTER — Ambulatory Visit (INDEPENDENT_AMBULATORY_CARE_PROVIDER_SITE_OTHER): Payer: Medicare Other | Admitting: Internal Medicine

## 2016-02-05 ENCOUNTER — Encounter: Payer: Self-pay | Admitting: Internal Medicine

## 2016-02-05 VITALS — BP 120/74 | HR 104 | Temp 99.4°F | Ht 73.25 in | Wt 194.5 lb

## 2016-02-05 DIAGNOSIS — D500 Iron deficiency anemia secondary to blood loss (chronic): Secondary | ICD-10-CM

## 2016-02-05 DIAGNOSIS — B9789 Other viral agents as the cause of diseases classified elsewhere: Secondary | ICD-10-CM

## 2016-02-05 DIAGNOSIS — J069 Acute upper respiratory infection, unspecified: Secondary | ICD-10-CM

## 2016-02-05 DIAGNOSIS — K50814 Crohn's disease of both small and large intestine with abscess: Secondary | ICD-10-CM | POA: Diagnosis not present

## 2016-02-05 DIAGNOSIS — K754 Autoimmune hepatitis: Secondary | ICD-10-CM | POA: Diagnosis not present

## 2016-02-05 DIAGNOSIS — F330 Major depressive disorder, recurrent, mild: Secondary | ICD-10-CM | POA: Diagnosis not present

## 2016-02-05 NOTE — Assessment & Plan Note (Signed)
Main issue is the associated blood loss anemia at this point vs nutritional or both

## 2016-02-05 NOTE — Patient Instructions (Signed)
   Hang in there we have all had this bad bug :(    Please follow up with Dr Carlean Purl in 2 months.      I appreciate the opportunity to care for you. Silvano Rusk, MD, [facility]

## 2016-02-05 NOTE — Progress Notes (Signed)
[name] 50 y.o. 1965-10-29 [id number]  Assessment & Plan:   Encounter Diagnoses  Name Primary?  . Crohn's disease of both small and large intestine with abscess ([place])   . Autoimmune hepatitis-PSC overlap   . Mild episode of recurrent major depressive disorder ([place])   . Iron deficiency anemia due to chronic blood loss Yes  . Viral URI with cough     Reassured today  Continue current Tx, no Abx today F/u Dr. Marcello Moores as planned Encouraged to take his mirtazapine To get feraheme soon Will need to do q 3 month labs and REV Colonoscopy 2018  Cc:Melinda Crutch, MD [name], MD Subjective:   Chief Complaint: tired, cough, abscess  HPI   Multiple c/o today Cough, mildly productive Fatigue Depressed but not overtly suicidal Still w/ purulent dc and pain from perianal area near seton - have tx w/ Abx x 2 in past 1 month - through My Chart messages Prednisone is 30 mg/day Mild off/on abd pain, "a little diarrhea" Still using prn hydrocodone - as far as I know does not overuse - have refilled about monthly  Allergies  Allergen Reactions  . Asacol [Mesalamine] Diarrhea    abd pain  . Flagyl [Metronidazole] Diarrhea    abd pain  . Azathioprine Diarrhea    abd pain  . Gluten Meal Diarrhea  . Mycophenolate Mofetil Diarrhea    abd pain  . Other     NUTS; constipation  . Wheat Bran Diarrhea    Constipation; flatulence; abd pain  . Tape Itching and Rash    Please use "paper" tape   Outpatient Medications Prior to Visit  Medication Sig Dispense Refill  . ascorbic acid (VITAMIN C) 1000 MG tablet Take 2,000 mg by mouth daily.     . Calcium Carb-Cholecalciferol (CALCIUM 1000 + D) 1000-800 MG-UNIT TABS Take by mouth. Takes 2 (2092m) tabs bid    . cholecalciferol (VITAMIN D) 1000 UNITS tablet Take 4,000 Units by mouth 2 (two) times daily.     . clonazePAM (KLONOPIN) 0.5 MG tablet Take 1 tablet (0.5 mg total) by mouth at bedtime as needed (insomnia). 20 tablet 0    . diltiazem 2 % GEL Apply a pea size amount into rectum 2 times a day 30 g 2  . fluticasone (FLONASE) 50 MCG/ACT nasal spray Place 2 sprays into the nose every morning. Each nostril     . HUMIRA PEN 40 MG/0.8ML PNKT INJECT 40 MG INTO THE SKIN EVERY 14 DAYS AFTER COMPLETION OF STARTER KIT 2 each 5  . HYDROcodone-acetaminophen (NORCO/VICODIN) 5-325 MG tablet Take 1-2 tablets by mouth every 6 (six) hours as needed for moderate pain. 30 tablet 0  . hydrocortisone (ANUSOL-HC) 2.5 % rectal cream Place 1 application rectally 2 (two) times daily as needed for hemorrhoids or itching. 30 g 1  . mirtazapine (REMERON) 30 MG tablet Take 30 mg by mouth at bedtime.    . predniSONE (DELTASONE) 20 MG tablet TAKE 1.25 TABLETS ONCE DAILY THEN USE AS DIRECTED BY MD (Patient taking differently: TAKE 1.50 TABLETS ONCE DAILY THEN USE AS DIRECTED BY MD--- total  370m-  takes in pm) 100 tablet 1  . vitamin A 25000 UNIT capsule Take 25,000 Units by mouth daily.    . Marland Kitchenevofloxacin (LEVAQUIN) 500 MG tablet Take 1 tablet (500 mg total) by mouth daily. 14 tablet 0  . metroNIDAZOLE (FLAGYL) 500 MG tablet Take 1 tablet (500 mg total) by mouth 2 (two) times daily. 14 tablet 0  No facility-administered medications prior to visit.    Past Medical History:  Diagnosis Date  . Anal fistula   . Anxiety   . 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 Autoimmune hepatitis ([place]) 01/19/2013  . Chronic mesenteric ischemia ([place])   . Crohn's disease of small and large intestines ([place])    followed by dr Glendell Docker Jillyan Plitt  . Dairy product intolerance   . Diarrhea, functional   . Family history of adverse reaction to anesthesia   . Grover's disease    transient acantholytic dermatosis  . History of alcohol abuse   . History of basal cell carcinoma excision    2013 left leg  . History of Clostridium difficile    10/ 2014  . History of multiple concussions    x6   last one Jan 2017 per pt--  no residual  . History of substance abuse     quit 1997 per pt  . History of suicide attempt    05-18-2012  overdose /  failure to thrive  . Hypercholesterolemia   . Iron deficiency anemia due to chronic blood loss   . Major depression, recurrent, chronic ([place])   . Osteopenia   . Personal history of adenomatous colonic polyps 12/2010, 03/2012   12/2010 - 8 mm serrated adenoma of rectum  . Portal vein thrombosis 03/21/2015   right  . Primary sclerosing cholangitis    ? hepatitis overlap - liver bx x 2 and MRCP  . Seasonal allergies   . Shortness of breath    w/ anemia   Past Surgical History:  Procedure Laterality Date  . ABDOMINAL AORTAGRAM N/A 03/29/2012   Procedure: ABDOMINAL Maxcine Ham;  Surgeon: Serafina Mitchell, MD;  Location: [facility] CATH LAB;  Service: Cardiovascular;  Laterality: N/A;  . ADENOIDECTOMY  age 17  . CATARACT EXTRACTION W/ INTRAOCULAR LENS  IMPLANT, BILATERAL  2009  . COLONOSCOPY  2001, 05/02/2003, 01/28/11   2012: Right colon Crohn's, rectal polyp  . COLONOSCOPY  03/31/2012   Procedure: COLONOSCOPY;  Surgeon: Jerene Bears, MD;  Location: [facility];  Service: Gastroenterology;  Laterality: N/A;  . ESOPHAGOGASTRODUODENOSCOPY  01/28/11   Normal  . FOOT SURGERY Right age 3  . MOHS SURGERY Left 11/2013   left ankle parakerotosis   . PERCUTANEOUS LIVER BIOPSY  2007 and 2008  . PILONIDAL CYST EXCISION  age 50  . PLACEMENT OF SETON N/A 11/06/2015   Procedure: PLACEMENT OF SETON;  Surgeon: [name], MD;  Location: [facility];  Service: General;  Laterality: N/A;   Social History   Social History  . Marital status: Single    Spouse name: N/A  . Number of children: 1  . Years of education: N/A   Occupational History  . Disability     Social History Main Topics  . Smoking status: Former Smoker    Packs/day: 1.00    Years: 15.00    Types: Cigarettes    Quit date: 03/28/1997  . Smokeless tobacco: Never Used  . Alcohol use No  . Drug use: No     Comment: QUIT USING DRUGS IN 1997  . Sexual  activity: Not Asked   Other Topics Concern  . None   Social History Narrative  . None   Family History  Problem Relation Age of Onset  . Heart disease Father   . Thyroid cancer Father   . Allergies Father   . Clotting disorder Father   . Breast cancer Mother   .  Stomach cancer Mother   . Colon cancer Neg Hx      Review of Systems As per HPI  Objective:   Physical Exam @BP  120/74 (BP Location: Left Arm, Patient Position: Sitting, Cuff Size: Normal)   Pulse (!) 104   Temp 99.4 F (37.4 C)   Ht 6' 1.25" (1.861 m)   Wt 194 lb 8 oz (88.2 kg)   BMI 25.49 kg/m @  General:  NAD, pale, husky voice Eyes:   Anicteric Pharynx: clear Lungs:  clear Heart::  S1S2 no rubs, murmurs or gallops Abdomen:  soft and nontender, BS+ Rectal:  Seton in place - exits right posterior with small nodule associated and slightly tender with purulent dc Ext:   no edema, cyanosis or clubbing Psych:  Flat affect - not suicidal   Data Reviewed:  Lab Results  Component Value Date   WBC 14.3 (H) 01/27/2016   HGB 9.1 (L) 01/27/2016   HCT 29.1 (L) 01/27/2016   MCV 70.7 (L) 01/27/2016   PLT 340.0 01/27/2016   Lab Results  Component Value Date   FERRITIN 8.9 (L) 01/27/2016

## 2016-02-05 NOTE — Assessment & Plan Note (Signed)
Seems situational + chronic Encouraged resumption of mirtazapine

## 2016-02-05 NOTE — Assessment & Plan Note (Signed)
LFT's NL

## 2016-02-09 DIAGNOSIS — M9903 Segmental and somatic dysfunction of lumbar region: Secondary | ICD-10-CM | POA: Diagnosis not present

## 2016-02-09 DIAGNOSIS — M5136 Other intervertebral disc degeneration, lumbar region: Secondary | ICD-10-CM | POA: Diagnosis not present

## 2016-02-09 DIAGNOSIS — M5411 Radiculopathy, occipito-atlanto-axial region: Secondary | ICD-10-CM | POA: Diagnosis not present

## 2016-02-09 DIAGNOSIS — M9901 Segmental and somatic dysfunction of cervical region: Secondary | ICD-10-CM | POA: Diagnosis not present

## 2016-02-10 ENCOUNTER — Encounter (HOSPITAL_COMMUNITY): Payer: Self-pay

## 2016-02-10 ENCOUNTER — Encounter (HOSPITAL_COMMUNITY)
Admission: RE | Admit: 2016-02-10 | Discharge: 2016-02-10 | Disposition: A | Discharge status: Home / Self Care | Payer: Medicare Other | Source: Ambulatory Visit | Attending: Internal Medicine | Admitting: Internal Medicine

## 2016-02-10 DIAGNOSIS — D508 Other iron deficiency anemias: Secondary | ICD-10-CM | POA: Diagnosis not present

## 2016-02-10 MED ORDER — SODIUM CHLORIDE 0.9 % IV SOLN
Freq: Once | INTRAVENOUS | Status: AC
  Administered 2016-02-10: 08:00:00 via INTRAVENOUS

## 2016-02-10 MED ORDER — SODIUM CHLORIDE 0.9 % IV SOLN
510.0000 mg | INTRAVENOUS | Status: DC
  Administered 2016-02-10: 510 mg via INTRAVENOUS
  Filled 2016-02-10: qty 17

## 2016-02-10 NOTE — Progress Notes (Signed)
Uneventful Feraheme infusion.  2nd infusion scheduled for Dec.19, pt is aware.  Vss, afebrile.  Pt has had feraheme many times before.  Pt does not want a print out of d/c instructions.  Pt states he is very familiar with the drug.  Pt was d/c ambulatory to lobby.

## 2016-02-12 DIAGNOSIS — M5136 Other intervertebral disc degeneration, lumbar region: Secondary | ICD-10-CM | POA: Diagnosis not present

## 2016-02-12 DIAGNOSIS — M5411 Radiculopathy, occipito-atlanto-axial region: Secondary | ICD-10-CM | POA: Diagnosis not present

## 2016-02-12 DIAGNOSIS — M9901 Segmental and somatic dysfunction of cervical region: Secondary | ICD-10-CM | POA: Diagnosis not present

## 2016-02-12 DIAGNOSIS — M9903 Segmental and somatic dysfunction of lumbar region: Secondary | ICD-10-CM | POA: Diagnosis not present

## 2016-02-13 ENCOUNTER — Other Ambulatory Visit: Payer: Self-pay | Admitting: Internal Medicine

## 2016-02-13 NOTE — Telephone Encounter (Signed)
I see according to last office note 02/05/16 he was to continue current therapy so sent in refill.

## 2016-02-13 NOTE — Telephone Encounter (Signed)
Okay to refill Sir?  Thank you.

## 2016-02-17 ENCOUNTER — Encounter (HOSPITAL_COMMUNITY)
Admission: RE | Admit: 2016-02-17 | Discharge: 2016-02-17 | Disposition: A | Discharge status: Home / Self Care | Payer: Medicare Other | Source: Ambulatory Visit | Attending: Internal Medicine | Admitting: Internal Medicine

## 2016-02-17 ENCOUNTER — Encounter (HOSPITAL_COMMUNITY): Payer: Self-pay

## 2016-02-17 DIAGNOSIS — D508 Other iron deficiency anemias: Secondary | ICD-10-CM | POA: Diagnosis not present

## 2016-02-17 MED ORDER — SODIUM CHLORIDE 0.9 % IV SOLN
510.0000 mg | INTRAVENOUS | Status: AC
  Administered 2016-02-17: 510 mg via INTRAVENOUS
  Filled 2016-02-17: qty 17

## 2016-02-17 MED ORDER — SODIUM CHLORIDE 0.9 % IV SOLN
Freq: Once | INTRAVENOUS | Status: AC
  Administered 2016-02-17: 12:00:00 via INTRAVENOUS

## 2016-02-18 DIAGNOSIS — M9903 Segmental and somatic dysfunction of lumbar region: Secondary | ICD-10-CM | POA: Diagnosis not present

## 2016-02-18 DIAGNOSIS — M9901 Segmental and somatic dysfunction of cervical region: Secondary | ICD-10-CM | POA: Diagnosis not present

## 2016-02-18 DIAGNOSIS — M5411 Radiculopathy, occipito-atlanto-axial region: Secondary | ICD-10-CM | POA: Diagnosis not present

## 2016-02-18 DIAGNOSIS — M5136 Other intervertebral disc degeneration, lumbar region: Secondary | ICD-10-CM | POA: Diagnosis not present

## 2016-02-24 ENCOUNTER — Encounter: Payer: Self-pay | Admitting: Internal Medicine

## 2016-02-25 ENCOUNTER — Other Ambulatory Visit: Payer: Self-pay | Admitting: Internal Medicine

## 2016-02-25 DIAGNOSIS — M9903 Segmental and somatic dysfunction of lumbar region: Secondary | ICD-10-CM | POA: Diagnosis not present

## 2016-02-25 DIAGNOSIS — K602 Anal fissure, unspecified: Secondary | ICD-10-CM

## 2016-02-25 DIAGNOSIS — M9901 Segmental and somatic dysfunction of cervical region: Secondary | ICD-10-CM | POA: Diagnosis not present

## 2016-02-25 DIAGNOSIS — K50819 Crohn's disease of both small and large intestine with unspecified complications: Secondary | ICD-10-CM

## 2016-02-25 DIAGNOSIS — M5411 Radiculopathy, occipito-atlanto-axial region: Secondary | ICD-10-CM | POA: Diagnosis not present

## 2016-02-25 DIAGNOSIS — M5136 Other intervertebral disc degeneration, lumbar region: Secondary | ICD-10-CM | POA: Diagnosis not present

## 2016-02-25 MED ORDER — HYDROCODONE-ACETAMINOPHEN 5-325 MG PO TABS: 1.0000 | ORAL_TABLET | Freq: Four times a day (QID) | ORAL | 0 refills | Status: DC | PRN

## 2016-02-27 DIAGNOSIS — L821 Other seborrheic keratosis: Secondary | ICD-10-CM | POA: Diagnosis not present

## 2016-02-27 DIAGNOSIS — L565 Disseminated superficial actinic porokeratosis (DSAP): Secondary | ICD-10-CM | POA: Diagnosis not present

## 2016-02-27 DIAGNOSIS — D225 Melanocytic nevi of trunk: Secondary | ICD-10-CM | POA: Diagnosis not present

## 2016-02-27 DIAGNOSIS — Z85828 Personal history of other malignant neoplasm of skin: Secondary | ICD-10-CM | POA: Diagnosis not present

## 2016-03-03 DIAGNOSIS — M9903 Segmental and somatic dysfunction of lumbar region: Secondary | ICD-10-CM | POA: Diagnosis not present

## 2016-03-03 DIAGNOSIS — M5411 Radiculopathy, occipito-atlanto-axial region: Secondary | ICD-10-CM | POA: Diagnosis not present

## 2016-03-03 DIAGNOSIS — M5136 Other intervertebral disc degeneration, lumbar region: Secondary | ICD-10-CM | POA: Diagnosis not present

## 2016-03-03 DIAGNOSIS — M9901 Segmental and somatic dysfunction of cervical region: Secondary | ICD-10-CM | POA: Diagnosis not present

## 2016-03-16 DIAGNOSIS — M9903 Segmental and somatic dysfunction of lumbar region: Secondary | ICD-10-CM | POA: Diagnosis not present

## 2016-03-16 DIAGNOSIS — M5136 Other intervertebral disc degeneration, lumbar region: Secondary | ICD-10-CM | POA: Diagnosis not present

## 2016-03-16 DIAGNOSIS — M5411 Radiculopathy, occipito-atlanto-axial region: Secondary | ICD-10-CM | POA: Diagnosis not present

## 2016-03-16 DIAGNOSIS — M9901 Segmental and somatic dysfunction of cervical region: Secondary | ICD-10-CM | POA: Diagnosis not present

## 2016-03-24 ENCOUNTER — Encounter: Payer: Self-pay | Admitting: Internal Medicine

## 2016-03-25 ENCOUNTER — Other Ambulatory Visit: Payer: Self-pay | Admitting: Internal Medicine

## 2016-03-25 ENCOUNTER — Other Ambulatory Visit (INDEPENDENT_AMBULATORY_CARE_PROVIDER_SITE_OTHER): Payer: Medicare Other

## 2016-03-25 DIAGNOSIS — K50819 Crohn's disease of both small and large intestine with unspecified complications: Secondary | ICD-10-CM

## 2016-03-25 DIAGNOSIS — D508 Other iron deficiency anemias: Secondary | ICD-10-CM | POA: Diagnosis not present

## 2016-03-25 DIAGNOSIS — K602 Anal fissure, unspecified: Secondary | ICD-10-CM

## 2016-03-25 LAB — CBC WITH DIFFERENTIAL/PLATELET
BASOS PCT: 0 % (ref 0.0–3.0)
Basophils Absolute: 0 10*3/uL (ref 0.0–0.1)
EOS ABS: 0.1 10*3/uL (ref 0.0–0.7)
Eosinophils Relative: 0.6 % (ref 0.0–5.0)
HCT: 33.8 % — ABNORMAL LOW (ref 39.0–52.0)
HEMOGLOBIN: 10.9 g/dL — AB (ref 13.0–17.0)
LYMPHS ABS: 1.2 10*3/uL (ref 0.7–4.0)
Lymphocytes Relative: 8.1 % — ABNORMAL LOW (ref 12.0–46.0)
MCHC: 32.2 g/dL (ref 30.0–36.0)
MCV: 74.4 fl — ABNORMAL LOW (ref 78.0–100.0)
MONO ABS: 0.9 10*3/uL (ref 0.1–1.0)
Monocytes Relative: 6.1 % (ref 3.0–12.0)
Neutro Abs: 12.1 10*3/uL — ABNORMAL HIGH (ref 1.4–7.7)
Platelets: 338 10*3/uL (ref 150.0–400.0)
RBC: 4.54 Mil/uL (ref 4.22–5.81)
RDW: 27.9 % — AB (ref 11.5–15.5)
WBC: 14.2 10*3/uL — AB (ref 4.0–10.5)

## 2016-03-25 LAB — FERRITIN: FERRITIN: 16.7 ng/mL — AB (ref 22.0–322.0)

## 2016-03-25 MED ORDER — HYDROCODONE-ACETAMINOPHEN 5-325 MG PO TABS: 1.0000 | ORAL_TABLET | Freq: Four times a day (QID) | ORAL | 0 refills | Status: DC | PRN

## 2016-03-25 NOTE — Progress Notes (Signed)
Ferritin is still low Please retreat with 2 feraheme injections and then recheck CBC and ferritin 1 month after second injection  Dx iron def anemia due to chronic blood loss

## 2016-03-25 NOTE — Progress Notes (Signed)
Refilling Vicodin

## 2016-03-26 ENCOUNTER — Other Ambulatory Visit: Payer: Self-pay

## 2016-03-26 DIAGNOSIS — D508 Other iron deficiency anemias: Secondary | ICD-10-CM

## 2016-03-31 DIAGNOSIS — M5411 Radiculopathy, occipito-atlanto-axial region: Secondary | ICD-10-CM | POA: Diagnosis not present

## 2016-03-31 DIAGNOSIS — M5136 Other intervertebral disc degeneration, lumbar region: Secondary | ICD-10-CM | POA: Diagnosis not present

## 2016-03-31 DIAGNOSIS — M9901 Segmental and somatic dysfunction of cervical region: Secondary | ICD-10-CM | POA: Diagnosis not present

## 2016-03-31 DIAGNOSIS — M9903 Segmental and somatic dysfunction of lumbar region: Secondary | ICD-10-CM | POA: Diagnosis not present

## 2016-04-01 ENCOUNTER — Ambulatory Visit (HOSPITAL_COMMUNITY)
Admission: RE | Admit: 2016-04-01 | Discharge: 2016-04-01 | Disposition: A | Discharge status: Home / Self Care | Payer: Medicare Other | Source: Ambulatory Visit | Attending: Internal Medicine | Admitting: Internal Medicine

## 2016-04-01 DIAGNOSIS — D508 Other iron deficiency anemias: Secondary | ICD-10-CM | POA: Insufficient documentation

## 2016-04-01 MED ORDER — SODIUM CHLORIDE 0.9 % IV SOLN
510.0000 mg | INTRAVENOUS | Status: DC
  Administered 2016-04-01: 510 mg via INTRAVENOUS
  Filled 2016-04-01: qty 17

## 2016-04-05 DIAGNOSIS — K50113 Crohn's disease of large intestine with fistula: Secondary | ICD-10-CM | POA: Diagnosis not present

## 2016-04-08 ENCOUNTER — Ambulatory Visit (HOSPITAL_COMMUNITY)
Admission: RE | Admit: 2016-04-08 | Discharge: 2016-04-08 | Disposition: A | Discharge status: Home / Self Care | Payer: Medicare Other | Source: Ambulatory Visit | Attending: Internal Medicine | Admitting: Internal Medicine

## 2016-04-08 DIAGNOSIS — D508 Other iron deficiency anemias: Secondary | ICD-10-CM | POA: Diagnosis not present

## 2016-04-08 MED ORDER — SODIUM CHLORIDE 0.9 % IV SOLN
510.0000 mg | INTRAVENOUS | Status: AC
  Administered 2016-04-08: 510 mg via INTRAVENOUS
  Filled 2016-04-08: qty 17

## 2016-04-13 ENCOUNTER — Other Ambulatory Visit: Payer: Self-pay | Admitting: Internal Medicine

## 2016-04-14 DIAGNOSIS — M9903 Segmental and somatic dysfunction of lumbar region: Secondary | ICD-10-CM | POA: Diagnosis not present

## 2016-04-14 DIAGNOSIS — M5411 Radiculopathy, occipito-atlanto-axial region: Secondary | ICD-10-CM | POA: Diagnosis not present

## 2016-04-14 DIAGNOSIS — M5136 Other intervertebral disc degeneration, lumbar region: Secondary | ICD-10-CM | POA: Diagnosis not present

## 2016-04-14 DIAGNOSIS — M9901 Segmental and somatic dysfunction of cervical region: Secondary | ICD-10-CM | POA: Diagnosis not present

## 2016-04-21 DIAGNOSIS — M9903 Segmental and somatic dysfunction of lumbar region: Secondary | ICD-10-CM | POA: Diagnosis not present

## 2016-04-21 DIAGNOSIS — M5136 Other intervertebral disc degeneration, lumbar region: Secondary | ICD-10-CM | POA: Diagnosis not present

## 2016-04-21 DIAGNOSIS — M5411 Radiculopathy, occipito-atlanto-axial region: Secondary | ICD-10-CM | POA: Diagnosis not present

## 2016-04-21 DIAGNOSIS — M9901 Segmental and somatic dysfunction of cervical region: Secondary | ICD-10-CM | POA: Diagnosis not present

## 2016-04-26 ENCOUNTER — Encounter: Payer: Self-pay | Admitting: Internal Medicine

## 2016-04-27 ENCOUNTER — Other Ambulatory Visit: Payer: Self-pay

## 2016-04-27 DIAGNOSIS — K50819 Crohn's disease of both small and large intestine with unspecified complications: Secondary | ICD-10-CM

## 2016-04-27 DIAGNOSIS — K602 Anal fissure, unspecified: Secondary | ICD-10-CM

## 2016-04-27 MED ORDER — HYDROCODONE-ACETAMINOPHEN 5-325 MG PO TABS: 1.0000 | ORAL_TABLET | Freq: Four times a day (QID) | ORAL | 0 refills | Status: DC | PRN

## 2016-04-27 NOTE — Telephone Encounter (Signed)
Patient informed that vicodin rx ready for pick up.

## 2016-04-27 NOTE — Telephone Encounter (Signed)
-----   Message from [name], MD sent at 04/26/2016  5:51 PM EST ----- We can refill it  I have been waiting to see him to review his use of this etc  I have not checked narcotic database lately but did in past and no other rxers   ----- Message ----- From: Marlon Pel, RN Sent: 04/26/2016  12:07 PM To: [name], MD  He needs a refill on hydrocodone.  Please advise

## 2016-04-27 NOTE — Telephone Encounter (Signed)
rx created and placed in your office to sign.

## 2016-04-28 DIAGNOSIS — M9901 Segmental and somatic dysfunction of cervical region: Secondary | ICD-10-CM | POA: Diagnosis not present

## 2016-04-28 DIAGNOSIS — M5411 Radiculopathy, occipito-atlanto-axial region: Secondary | ICD-10-CM | POA: Diagnosis not present

## 2016-04-28 DIAGNOSIS — M5136 Other intervertebral disc degeneration, lumbar region: Secondary | ICD-10-CM | POA: Diagnosis not present

## 2016-04-28 DIAGNOSIS — M9903 Segmental and somatic dysfunction of lumbar region: Secondary | ICD-10-CM | POA: Diagnosis not present

## 2016-05-12 DIAGNOSIS — M5136 Other intervertebral disc degeneration, lumbar region: Secondary | ICD-10-CM | POA: Diagnosis not present

## 2016-05-12 DIAGNOSIS — M9903 Segmental and somatic dysfunction of lumbar region: Secondary | ICD-10-CM | POA: Diagnosis not present

## 2016-05-12 DIAGNOSIS — M9901 Segmental and somatic dysfunction of cervical region: Secondary | ICD-10-CM | POA: Diagnosis not present

## 2016-05-12 DIAGNOSIS — M5411 Radiculopathy, occipito-atlanto-axial region: Secondary | ICD-10-CM | POA: Diagnosis not present

## 2016-05-13 ENCOUNTER — Encounter: Payer: Self-pay | Admitting: Internal Medicine

## 2016-05-18 ENCOUNTER — Ambulatory Visit (INDEPENDENT_AMBULATORY_CARE_PROVIDER_SITE_OTHER): Payer: Medicare Other | Admitting: Internal Medicine

## 2016-05-18 ENCOUNTER — Encounter: Payer: Self-pay | Admitting: Internal Medicine

## 2016-05-18 ENCOUNTER — Other Ambulatory Visit (INDEPENDENT_AMBULATORY_CARE_PROVIDER_SITE_OTHER): Payer: Medicare Other

## 2016-05-18 VITALS — BP 120/74 | HR 72 | Resp 18 | Ht 73.0 in | Wt 198.0 lb

## 2016-05-18 DIAGNOSIS — K50811 Crohn's disease of both small and large intestine with rectal bleeding: Secondary | ICD-10-CM

## 2016-05-18 DIAGNOSIS — K754 Autoimmune hepatitis: Secondary | ICD-10-CM

## 2016-05-18 DIAGNOSIS — D508 Other iron deficiency anemias: Secondary | ICD-10-CM

## 2016-05-18 DIAGNOSIS — R7309 Other abnormal glucose: Secondary | ICD-10-CM | POA: Diagnosis not present

## 2016-05-18 DIAGNOSIS — D500 Iron deficiency anemia secondary to blood loss (chronic): Secondary | ICD-10-CM

## 2016-05-18 DIAGNOSIS — K604 Rectal fistula: Secondary | ICD-10-CM | POA: Diagnosis not present

## 2016-05-18 LAB — CBC WITH DIFFERENTIAL/PLATELET
Basophils Absolute: 0 10*3/uL (ref 0.0–0.1)
Basophils Relative: 0.1 % (ref 0.0–3.0)
EOS ABS: 0 10*3/uL (ref 0.0–0.7)
Eosinophils Relative: 0 % (ref 0.0–5.0)
HCT: 37.2 % — ABNORMAL LOW (ref 39.0–52.0)
HEMOGLOBIN: 11.9 g/dL — AB (ref 13.0–17.0)
LYMPHS ABS: 0.5 10*3/uL — AB (ref 0.7–4.0)
Lymphocytes Relative: 2.9 % — ABNORMAL LOW (ref 12.0–46.0)
MCHC: 31.9 g/dL (ref 30.0–36.0)
MCV: 78 fl (ref 78.0–100.0)
MONO ABS: 0.5 10*3/uL (ref 0.1–1.0)
Monocytes Relative: 3.3 % (ref 3.0–12.0)
Neutro Abs: 15 10*3/uL — ABNORMAL HIGH (ref 1.4–7.7)
Platelets: 339 10*3/uL (ref 150.0–400.0)
RBC: 4.77 Mil/uL (ref 4.22–5.81)
RDW: 23.2 % — ABNORMAL HIGH (ref 11.5–15.5)
WBC: 16 10*3/uL — AB (ref 4.0–10.5)

## 2016-05-18 LAB — COMPREHENSIVE METABOLIC PANEL
ALT: 25 U/L (ref 0–53)
AST: 15 U/L (ref 0–37)
Albumin: 3.3 g/dL — ABNORMAL LOW (ref 3.5–5.2)
Alkaline Phosphatase: 83 U/L (ref 39–117)
BILIRUBIN TOTAL: 0.3 mg/dL (ref 0.2–1.2)
BUN: 21 mg/dL (ref 6–23)
CO2: 26 meq/L (ref 19–32)
Calcium: 8.7 mg/dL (ref 8.4–10.5)
Chloride: 103 mEq/L (ref 96–112)
Creatinine, Ser: 0.99 mg/dL (ref 0.40–1.50)
GFR: 84.04 mL/min (ref >60.00)
GLUCOSE: 120 mg/dL — AB (ref 70–99)
POTASSIUM: 3.9 meq/L (ref 3.5–5.1)
SODIUM: 137 meq/L (ref 135–145)
Total Protein: 6 g/dL (ref 6.0–8.3)

## 2016-05-18 LAB — FERRITIN: FERRITIN: 25.7 ng/mL (ref 22.0–322.0)

## 2016-05-18 NOTE — Patient Instructions (Signed)
Your physician has requested that you go to the basement for the following lab work before leaving today: CBC/diff, Ferritin, CMET   Please follow up with Dr Carlean Purl in 3 months.   I appreciate the opportunity to care for you. Silvano Rusk, MD, [facility]

## 2016-05-18 NOTE — Assessment & Plan Note (Signed)
Continue seton in place

## 2016-05-18 NOTE — Assessment & Plan Note (Addendum)
Stable on prednisone Check labs

## 2016-05-18 NOTE — Assessment & Plan Note (Addendum)
Stable ? Sl improved change her current care. Prednisone and Humira. He is well aware of the dangers of long-term prednisone but does not feel well with she's taking some of it plus a treats his liver disease. Back in 3 months for return visit. I need to review his pain medication use with him by phone. May need a contract though he has been reliable. We talked about reassessing his disease to determine how effective his therapy is as he still has some rectal bleeding though it could be from the seton the fistula, and some diarrhea but he does not want to do a colonoscopy. I explained that we could eventually need change biologic agent.

## 2016-05-18 NOTE — Assessment & Plan Note (Signed)
Las today

## 2016-05-18 NOTE — Progress Notes (Signed)
[name] 51 y.o. 1965/10/28 [id number]  Assessment & Plan:   CROHN'S DISEASE, LARGE AND SMALL INTESTINES Stable ? Sl improved change her current care. Prednisone and Humira. He is well aware of the dangers of long-term prednisone but does not feel well with she's taking some of it plus a treats his liver disease. Back in 3 months for return visit. I need to review his pain medication use with him by phone. May need a contract though he has been reliable. We talked about reassessing his disease to determine how effective his therapy is as he still has some rectal bleeding though it could be from the seton the fistula, and some diarrhea but he does not want to do a colonoscopy. I explained that we could eventually need change biologic agent.  Autoimmune hepatitis-PSC overlap Stable on prednisone Check labs  Anemia due to chronic blood loss Las today  Perirectal fistula Continue seton in place   He has inquired about the use of CBD oil, as he is red that it can help Crohn's. I will leave that up to him I think it's unlikely to be dangerous I'm not inclined to prescribe it but he may be elderly get some of this over-the-counter to see if it helps him feel better.  I appreciate the opportunity to care for this patient. CC: Melinda Crutch, MD Dr. [name] Subjective:   Chief Complaint: Follow-up of Crohn's disease, anemia and autoimmune liver disease  HPI [name] is here today, overall says he is feeling better. He is on 30 mg a prednisone and is Humira. He still has the seton in place. Intermittent purulent drainage, though much less over time less rectal bleeding as well as. Less diarrhea.  Is not complaining of abdominal pain. Had Feraheme administration again in January 2 months ago. Allergies  Allergen Reactions  . Asacol [Mesalamine] Diarrhea    abd pain  . Flagyl [Metronidazole] Diarrhea    abd pain  . Azathioprine Diarrhea    abd pain  . Gluten Meal  Diarrhea  . Mycophenolate Mofetil Diarrhea    abd pain  . Other     NUTS; constipation  . Wheat Bran Diarrhea    Constipation; flatulence; abd pain  . Tape Itching and Rash    Please use "paper" tape   Current Meds  Medication Sig  . ascorbic acid (VITAMIN C) 1000 MG tablet Take 2,000 mg by mouth daily.   . Calcium Carb-Cholecalciferol (CALCIUM 1000 + D) 1000-800 MG-UNIT TABS Take by mouth. Takes 2 (2074m) tabs bid  . cholecalciferol (VITAMIN D) 1000 UNITS tablet Take 4,000 Units by mouth 2 (two) times daily.   . clonazePAM (KLONOPIN) 0.5 MG tablet Take 1 tablet (0.5 mg total) by mouth at bedtime as needed (insomnia).  .Marland Kitchendiltiazem 2 % GEL Apply a pea size amount into rectum 2 times a day  . fluticasone (FLONASE) 50 MCG/ACT nasal spray Place 2 sprays into the nose every morning. Each nostril   . HUMIRA PEN 40 MG/0.8ML PNKT INJECT 40 MG INTO THE SKIN EVERY 14 DAYS AFTER COMPLETION OF STARTER KIT  . HYDROcodone-acetaminophen (NORCO/VICODIN) 5-325 MG tablet Take 1-2 tablets by mouth every 6 (six) hours as needed for moderate pain.  . hydrocortisone (ANUSOL-HC) 2.5 % rectal cream Place 1 application rectally 2 (two) times daily as needed for hemorrhoids or itching.  . mirtazapine (REMERON) 30 MG tablet Take 30 mg by mouth at bedtime.  . predniSONE (DELTASONE) 20 MG tablet TAKE 1.25 TABLETS ONCE  DAILY THEN USE AS DIRECTED BY MD  . vitamin A 25000 UNIT capsule Take 25,000 Units by mouth daily.    Past Medical History:  Diagnosis Date  . Anal fistula   . Anxiety   . 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 Autoimmune hepatitis ([place]) 01/19/2013  . Chronic mesenteric ischemia ([place])   . Crohn's disease of small and large intestines ([place])    followed by dr Glendell Docker gessner  . Dairy product intolerance   . Diarrhea, functional   . Family history of adverse reaction to anesthesia   . Grover's disease    transient acantholytic dermatosis  . History of alcohol abuse   . History of basal cell  carcinoma excision    2013 left leg  . History of Clostridium difficile    10/ 2014  . History of multiple concussions    x6   last one Jan 2017 per pt--  no residual  . History of substance abuse    quit 1997 per pt  . History of suicide attempt    05-18-2012  overdose /  failure to thrive  . Hypercholesterolemia   . Iron deficiency anemia due to chronic blood loss   . Major depression, recurrent, chronic ([place])   . Osteopenia   . Personal history of adenomatous colonic polyps 12/2010, 03/2012   12/2010 - 8 mm serrated adenoma of rectum  . Portal vein thrombosis 03/21/2015   right  . Primary sclerosing cholangitis    ? hepatitis overlap - liver bx x 2 and MRCP  . Seasonal allergies   . Shortness of breath    w/ anemia   Past Surgical History:  Procedure Laterality Date  . ABDOMINAL AORTAGRAM N/A 03/29/2012   Procedure: ABDOMINAL Maxcine Ham;  Surgeon: Serafina Mitchell, MD;  Location: [facility] CATH LAB;  Service: Cardiovascular;  Laterality: N/A;  . ADENOIDECTOMY  age 54  . CATARACT EXTRACTION W/ INTRAOCULAR LENS  IMPLANT, BILATERAL  2009  . COLONOSCOPY  2001, 05/02/2003, 01/28/11   2012: Right colon Crohn's, rectal polyp  . COLONOSCOPY  03/31/2012   Procedure: COLONOSCOPY;  Surgeon: Jerene Bears, MD;  Location: [place];  Service: Gastroenterology;  Laterality: N/A;  . ESOPHAGOGASTRODUODENOSCOPY  01/28/11   Normal  . FOOT SURGERY Right age 82  . MOHS SURGERY Left 11/2013   left ankle parakerotosis   . PERCUTANEOUS LIVER BIOPSY  2007 and 2008  . PILONIDAL CYST EXCISION  age 21  . PLACEMENT OF SETON N/A 11/06/2015   Procedure: PLACEMENT OF SETON;  Surgeon: [name], MD;  Location: [place];  Service: General;  Laterality: N/A;    Review of Systems As above  Objective:   Physical Exam BP 120/74   Pulse 72   Resp 18   Ht 6' 1"  (1.854 m)   Wt 89.8 kg (198 lb)   BMI 26.12 kg/m  Eyes anicteric Lungs cta Cor s1s2 no rmg abd benign Rectal - seton in place  with small nodule - oozes pus with pressure

## 2016-05-19 DIAGNOSIS — M5411 Radiculopathy, occipito-atlanto-axial region: Secondary | ICD-10-CM | POA: Diagnosis not present

## 2016-05-19 DIAGNOSIS — M9901 Segmental and somatic dysfunction of cervical region: Secondary | ICD-10-CM | POA: Diagnosis not present

## 2016-05-19 DIAGNOSIS — M9903 Segmental and somatic dysfunction of lumbar region: Secondary | ICD-10-CM | POA: Diagnosis not present

## 2016-05-19 DIAGNOSIS — M5136 Other intervertebral disc degeneration, lumbar region: Secondary | ICD-10-CM | POA: Diagnosis not present

## 2016-05-19 LAB — PATHOLOGIST SMEAR REVIEW

## 2016-05-25 ENCOUNTER — Encounter: Payer: Self-pay | Admitting: Internal Medicine

## 2016-05-26 DIAGNOSIS — M9903 Segmental and somatic dysfunction of lumbar region: Secondary | ICD-10-CM | POA: Diagnosis not present

## 2016-05-26 DIAGNOSIS — M5136 Other intervertebral disc degeneration, lumbar region: Secondary | ICD-10-CM | POA: Diagnosis not present

## 2016-05-26 DIAGNOSIS — M9901 Segmental and somatic dysfunction of cervical region: Secondary | ICD-10-CM | POA: Diagnosis not present

## 2016-05-26 DIAGNOSIS — M5411 Radiculopathy, occipito-atlanto-axial region: Secondary | ICD-10-CM | POA: Diagnosis not present

## 2016-05-27 ENCOUNTER — Telehealth: Payer: Self-pay

## 2016-05-27 ENCOUNTER — Other Ambulatory Visit: Payer: Self-pay | Admitting: Internal Medicine

## 2016-05-27 DIAGNOSIS — K50819 Crohn's disease of both small and large intestine with unspecified complications: Secondary | ICD-10-CM

## 2016-05-27 DIAGNOSIS — K602 Anal fissure, unspecified: Secondary | ICD-10-CM

## 2016-05-27 DIAGNOSIS — D500 Iron deficiency anemia secondary to blood loss (chronic): Secondary | ICD-10-CM

## 2016-05-27 MED ORDER — HYDROCODONE-ACETAMINOPHEN 5-325 MG PO TABS: 1.0000 | ORAL_TABLET | Freq: Four times a day (QID) | ORAL | 0 refills | Status: DC | PRN

## 2016-05-27 NOTE — Telephone Encounter (Signed)
Left [name] a message to come pick up his rx and make an appointment.  See the mychart message from today.

## 2016-05-27 NOTE — Telephone Encounter (Signed)
[name] came by and picked up samples and made appointment.

## 2016-06-02 DIAGNOSIS — M9901 Segmental and somatic dysfunction of cervical region: Secondary | ICD-10-CM | POA: Diagnosis not present

## 2016-06-02 DIAGNOSIS — M9903 Segmental and somatic dysfunction of lumbar region: Secondary | ICD-10-CM | POA: Diagnosis not present

## 2016-06-02 DIAGNOSIS — M5136 Other intervertebral disc degeneration, lumbar region: Secondary | ICD-10-CM | POA: Diagnosis not present

## 2016-06-02 DIAGNOSIS — M5411 Radiculopathy, occipito-atlanto-axial region: Secondary | ICD-10-CM | POA: Diagnosis not present

## 2016-06-09 DIAGNOSIS — M9901 Segmental and somatic dysfunction of cervical region: Secondary | ICD-10-CM | POA: Diagnosis not present

## 2016-06-09 DIAGNOSIS — M9903 Segmental and somatic dysfunction of lumbar region: Secondary | ICD-10-CM | POA: Diagnosis not present

## 2016-06-09 DIAGNOSIS — M5136 Other intervertebral disc degeneration, lumbar region: Secondary | ICD-10-CM | POA: Diagnosis not present

## 2016-06-09 DIAGNOSIS — M5411 Radiculopathy, occipito-atlanto-axial region: Secondary | ICD-10-CM | POA: Diagnosis not present

## 2016-06-16 DIAGNOSIS — M5411 Radiculopathy, occipito-atlanto-axial region: Secondary | ICD-10-CM | POA: Diagnosis not present

## 2016-06-16 DIAGNOSIS — M9903 Segmental and somatic dysfunction of lumbar region: Secondary | ICD-10-CM | POA: Diagnosis not present

## 2016-06-16 DIAGNOSIS — M9901 Segmental and somatic dysfunction of cervical region: Secondary | ICD-10-CM | POA: Diagnosis not present

## 2016-06-16 DIAGNOSIS — M5136 Other intervertebral disc degeneration, lumbar region: Secondary | ICD-10-CM | POA: Diagnosis not present

## 2016-06-22 ENCOUNTER — Encounter: Payer: Self-pay | Admitting: Internal Medicine

## 2016-06-23 ENCOUNTER — Telehealth: Payer: Self-pay

## 2016-06-23 DIAGNOSIS — M5411 Radiculopathy, occipito-atlanto-axial region: Secondary | ICD-10-CM | POA: Diagnosis not present

## 2016-06-23 DIAGNOSIS — M9901 Segmental and somatic dysfunction of cervical region: Secondary | ICD-10-CM | POA: Diagnosis not present

## 2016-06-23 DIAGNOSIS — M9903 Segmental and somatic dysfunction of lumbar region: Secondary | ICD-10-CM | POA: Diagnosis not present

## 2016-06-23 DIAGNOSIS — K602 Anal fissure, unspecified: Secondary | ICD-10-CM

## 2016-06-23 DIAGNOSIS — M5136 Other intervertebral disc degeneration, lumbar region: Secondary | ICD-10-CM | POA: Diagnosis not present

## 2016-06-23 DIAGNOSIS — K50819 Crohn's disease of both small and large intestine with unspecified complications: Secondary | ICD-10-CM

## 2016-06-23 MED ORDER — HYDROCODONE-ACETAMINOPHEN 5-325 MG PO TABS: 1.0000 | ORAL_TABLET | Freq: Four times a day (QID) | ORAL | 0 refills | Status: DC | PRN

## 2016-06-23 NOTE — Telephone Encounter (Signed)
-----   Message from [name], MD sent at 06/23/2016  9:26 AM EDT ----- Regarding: refill We can refill his pain meds for 1 month If you print it and put in my chair I will sign today when there for [place] mtg   Would you also see if there is a [facility] narcotic contract I could see and adapt?

## 2016-06-23 NOTE — Telephone Encounter (Signed)
rx printed

## 2016-06-24 ENCOUNTER — Other Ambulatory Visit (INDEPENDENT_AMBULATORY_CARE_PROVIDER_SITE_OTHER): Payer: Medicare Other

## 2016-06-24 DIAGNOSIS — D500 Iron deficiency anemia secondary to blood loss (chronic): Secondary | ICD-10-CM

## 2016-06-24 LAB — CBC WITH DIFFERENTIAL/PLATELET
BASOS PCT: 0.5 % (ref 0.0–3.0)
Basophils Absolute: 0.1 10*3/uL (ref 0.0–0.1)
EOS PCT: 1 % (ref 0.0–5.0)
Eosinophils Absolute: 0.1 10*3/uL (ref 0.0–0.7)
HCT: 32.5 % — ABNORMAL LOW (ref 39.0–52.0)
Hemoglobin: 10.1 g/dL — ABNORMAL LOW (ref 13.0–17.0)
LYMPHS ABS: 1.7 10*3/uL (ref 0.7–4.0)
Lymphocytes Relative: 12.1 % (ref 12.0–46.0)
MCHC: 31.1 g/dL (ref 30.0–36.0)
MCV: 72.5 fl — ABNORMAL LOW (ref 78.0–100.0)
MONO ABS: 2 10*3/uL — AB (ref 0.1–1.0)
MONOS PCT: 14.1 % — AB (ref 3.0–12.0)
NEUTROS ABS: 10 10*3/uL — AB (ref 1.4–7.7)
NEUTROS PCT: 72.3 % (ref 43.0–77.0)
Platelets: 379 10*3/uL (ref 150.0–400.0)
RBC: 4.49 Mil/uL (ref 4.22–5.81)
RDW: 20.8 % — AB (ref 11.5–15.5)
WBC: 13.8 10*3/uL — ABNORMAL HIGH (ref 4.0–10.5)

## 2016-06-24 LAB — FERRITIN: FERRITIN: 10.6 ng/mL — AB (ref 22.0–322.0)

## 2016-06-25 DIAGNOSIS — Z13220 Encounter for screening for lipoid disorders: Secondary | ICD-10-CM | POA: Diagnosis not present

## 2016-06-25 DIAGNOSIS — F322 Major depressive disorder, single episode, severe without psychotic features: Secondary | ICD-10-CM | POA: Diagnosis not present

## 2016-06-25 DIAGNOSIS — E559 Vitamin D deficiency, unspecified: Secondary | ICD-10-CM | POA: Diagnosis not present

## 2016-06-25 DIAGNOSIS — F411 Generalized anxiety disorder: Secondary | ICD-10-CM | POA: Diagnosis not present

## 2016-06-25 DIAGNOSIS — Z0000 Encounter for general adult medical examination without abnormal findings: Secondary | ICD-10-CM | POA: Diagnosis not present

## 2016-06-30 ENCOUNTER — Encounter: Payer: Self-pay | Admitting: Internal Medicine

## 2016-06-30 DIAGNOSIS — M5411 Radiculopathy, occipito-atlanto-axial region: Secondary | ICD-10-CM | POA: Diagnosis not present

## 2016-06-30 DIAGNOSIS — M9903 Segmental and somatic dysfunction of lumbar region: Secondary | ICD-10-CM | POA: Diagnosis not present

## 2016-06-30 DIAGNOSIS — M9901 Segmental and somatic dysfunction of cervical region: Secondary | ICD-10-CM | POA: Diagnosis not present

## 2016-06-30 DIAGNOSIS — M5136 Other intervertebral disc degeneration, lumbar region: Secondary | ICD-10-CM | POA: Diagnosis not present

## 2016-07-07 ENCOUNTER — Encounter: Payer: Self-pay | Admitting: Internal Medicine

## 2016-07-07 DIAGNOSIS — M9901 Segmental and somatic dysfunction of cervical region: Secondary | ICD-10-CM | POA: Diagnosis not present

## 2016-07-07 DIAGNOSIS — M5411 Radiculopathy, occipito-atlanto-axial region: Secondary | ICD-10-CM | POA: Diagnosis not present

## 2016-07-07 DIAGNOSIS — M9903 Segmental and somatic dysfunction of lumbar region: Secondary | ICD-10-CM | POA: Diagnosis not present

## 2016-07-07 DIAGNOSIS — M5136 Other intervertebral disc degeneration, lumbar region: Secondary | ICD-10-CM | POA: Diagnosis not present

## 2016-07-09 ENCOUNTER — Ambulatory Visit (INDEPENDENT_AMBULATORY_CARE_PROVIDER_SITE_OTHER): Payer: Medicare Other | Admitting: Internal Medicine

## 2016-07-09 ENCOUNTER — Other Ambulatory Visit (INDEPENDENT_AMBULATORY_CARE_PROVIDER_SITE_OTHER): Payer: Medicare Other

## 2016-07-09 ENCOUNTER — Encounter: Payer: Self-pay | Admitting: Internal Medicine

## 2016-07-09 DIAGNOSIS — D500 Iron deficiency anemia secondary to blood loss (chronic): Secondary | ICD-10-CM | POA: Diagnosis not present

## 2016-07-09 DIAGNOSIS — F332 Major depressive disorder, recurrent severe without psychotic features: Secondary | ICD-10-CM

## 2016-07-09 DIAGNOSIS — K754 Autoimmune hepatitis: Secondary | ICD-10-CM | POA: Diagnosis not present

## 2016-07-09 DIAGNOSIS — G894 Chronic pain syndrome: Secondary | ICD-10-CM | POA: Diagnosis not present

## 2016-07-09 DIAGNOSIS — K50818 Crohn's disease of both small and large intestine with other complication: Secondary | ICD-10-CM

## 2016-07-09 HISTORY — DX: Chronic pain syndrome: G89.4

## 2016-07-09 LAB — CBC WITH DIFFERENTIAL/PLATELET
BASOS PCT: 0.2 % (ref 0.0–3.0)
Basophils Absolute: 0 10*3/uL (ref 0.0–0.1)
Eosinophils Absolute: 0 10*3/uL (ref 0.0–0.7)
Eosinophils Relative: 0.1 % (ref 0.0–5.0)
HCT: 32.8 % — ABNORMAL LOW (ref 39.0–52.0)
Hemoglobin: 9.9 g/dL — ABNORMAL LOW (ref 13.0–17.0)
LYMPHS ABS: 0.7 10*3/uL (ref 0.7–4.0)
Lymphocytes Relative: 4.1 % — ABNORMAL LOW (ref 12.0–46.0)
MCHC: 30.3 g/dL (ref 30.0–36.0)
MCV: 70.2 fl — ABNORMAL LOW (ref 78.0–100.0)
MONO ABS: 0.8 10*3/uL (ref 0.1–1.0)
Monocytes Relative: 4.6 % (ref 3.0–12.0)
NEUTROS ABS: 15.6 10*3/uL — AB (ref 1.4–7.7)
PLATELETS: 447 10*3/uL — AB (ref 150.0–400.0)
RBC: 4.67 Mil/uL (ref 4.22–5.81)
RDW: 20.2 % — AB (ref 11.5–15.5)
WBC: 17.2 10*3/uL — ABNORMAL HIGH (ref 4.0–10.5)

## 2016-07-09 LAB — COMPREHENSIVE METABOLIC PANEL
ALBUMIN: 3.6 g/dL (ref 3.5–5.2)
ALT: 39 U/L (ref 0–53)
AST: 20 U/L (ref 0–37)
Alkaline Phosphatase: 79 U/L (ref 39–117)
BUN: 18 mg/dL (ref 6–23)
CALCIUM: 8.8 mg/dL (ref 8.4–10.5)
CHLORIDE: 104 meq/L (ref 96–112)
CO2: 28 meq/L (ref 19–32)
CREATININE: 1.14 mg/dL (ref 0.40–1.50)
GFR: 71.37 mL/min (ref >60.00)
Glucose, Bld: 99 mg/dL (ref 70–99)
POTASSIUM: 4 meq/L (ref 3.5–5.1)
Sodium: 139 mEq/L (ref 135–145)
Total Bilirubin: 0.3 mg/dL (ref 0.2–1.2)
Total Protein: 6.2 g/dL (ref 6.0–8.3)

## 2016-07-09 LAB — FERRITIN: Ferritin: 8.8 ng/mL — ABNORMAL LOW (ref 22.0–322.0)

## 2016-07-09 LAB — VITAMIN B12: VITAMIN B 12: 385 pg/mL (ref 211–911)

## 2016-07-09 NOTE — Assessment & Plan Note (Addendum)
Failing Humira it seems to me. He will continue his prednisone and I will look into other treatment. We discussed possible diagnostic and colonoscopy to evaluate is not inclined to do so I don't think it's essential but do think we should follow-up with one after therapy is changed probably 6 months after starting treatment Entyvio vs Stelara

## 2016-07-09 NOTE — Assessment & Plan Note (Signed)
No change in Tx

## 2016-07-09 NOTE — Assessment & Plan Note (Signed)
Recheck CBC, ferritin and B12 Consider another iron infusion pending labs Will change Crohn's Rx

## 2016-07-09 NOTE — Progress Notes (Signed)
[name] 51 y.o. [birth year] [id number]  Assessment & Plan:   Anemia due to chronic blood loss Recheck CBC, ferritin and B12 Consider another iron infusion pending labs Will change Crohn's Rx  CROHN'S DISEASE, LARGE AND SMALL INTESTINES Failing Humira it seems to me. He will continue his prednisone and I will look into other treatment. We discussed possible diagnostic and colonoscopy to evaluate is not inclined to do so I don't think it's essential but do think we should follow-up with one after therapy is changed probably 6 months after starting treatment Entyvio vs Stelara  Autoimmune hepatitis-PSC overlap Has been ok  Major depressive disorder, recurrent episode, severe No change in Tx  Perirectal fistula Delphina Cahill is working  Chronic pain syndrome He has been compliant through the years as best I can tell, we did have him sign a contract today.   I appreciate the opportunity to care for this patient. CC: [name], MD Dr. [name]  Subjective:   Chief Complaint: Tired, anemic, Crohn's follow-up  HPI  [name] is here. He still has waxing and waning symptoms of diarrhea and mucous production with some rectal bleeding at times though that seems to be limited and minor. He denies any melena. He has had 8 Feraheme infusions in the last couple of years but his ferritin never normalizes. He tried to go to 15 mg of prednisone but said his diarrhea worsened, and he felt worse so went back to 20 mg and believes he can manage there. No fever or chills. He's been irritable. He continues to get hydrocodone monthly and has been compliant with that. He signed a Soil scientist on that today. Last colonoscopy showed ileocolonic inflammation right colon terminal ileum. That was October 2015.  Current Meds  Medication Sig  . ascorbic acid (VITAMIN C) 1000 MG tablet Take 2,000 mg by mouth daily.   . Calcium Carb-Cholecalciferol (CALCIUM 1000 + D) 1000-800 MG-UNIT TABS  Take by mouth. Takes 2 (201m) tabs bid  . cholecalciferol (VITAMIN D) 1000 UNITS tablet Take 4,000 Units by mouth 2 (two) times daily.   . clonazePAM (KLONOPIN) 0.5 MG tablet Take 1 tablet (0.5 mg total) by mouth at bedtime as needed (insomnia).  .Marland Kitchendiltiazem 2 % GEL Apply a pea size amount into rectum 2 times a day  . fluticasone (FLONASE) 50 MCG/ACT nasal spray Place 2 sprays into the nose every morning. Each nostril   . HUMIRA PEN 40 MG/0.8ML PNKT INJECT 40 MG INTO THE SKIN EVERY 14 DAYS AFTER COMPLETION OF STARTER KIT  . HYDROcodone-acetaminophen (NORCO/VICODIN) 5-325 MG tablet Take 1-2 tablets by mouth every 6 (six) hours as needed for moderate pain.  . hydrocortisone (ANUSOL-HC) 2.5 % rectal cream Place 1 application rectally 2 (two) times daily as needed for hemorrhoids or itching.  . mirtazapine (REMERON) 30 MG tablet Take 30 mg by mouth at bedtime.  . predniSONE (DELTASONE) 20 MG tablet TAKE 1.25 TABLETS ONCE DAILY THEN USE AS DIRECTED BY MD  . vitamin A 25000 UNIT capsule Take 25,000 Units by mouth daily.   Allergies  Allergen Reactions  . Asacol [Mesalamine] Diarrhea    abd pain  . Flagyl [Metronidazole] Diarrhea    abd pain  . Azathioprine Diarrhea    abd pain  . Gluten Meal Diarrhea  . Mycophenolate Mofetil Diarrhea    abd pain  . Other     NUTS; constipation  . Wheat Bran Diarrhea    Constipation; flatulence; abd pain  . Tape Itching and  Rash    Please use "paper" tape   Past Medical History:  Diagnosis Date  . Anal fistula   . Anxiety   . 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 Autoimmune hepatitis ([place]) 01/19/2013  . Chronic mesenteric ischemia ([place])   . Chronic pain syndrome 07/09/2016  . Crohn's disease of small and large intestines ([place])    followed by dr Glendell Docker Mana Haberl  . Dairy product intolerance   . Diarrhea, functional   . Family history of adverse reaction to anesthesia   . Grover's disease    transient acantholytic dermatosis  . History of alcohol  abuse   . History of basal cell carcinoma excision    2013 left leg  . History of Clostridium difficile    10/ 2014  . History of multiple concussions    x6   last one Jan 2017 per pt--  no residual  . History of substance abuse    quit 1997 per pt  . History of suicide attempt    05-18-2012  overdose /  failure to thrive  . Hypercholesterolemia   . Iron deficiency anemia due to chronic blood loss   . Major depression, recurrent, chronic ([place])   . Osteopenia   . Personal history of adenomatous colonic polyps 12/2010, 03/2012   12/2010 - 8 mm serrated adenoma of rectum  . Portal vein thrombosis 03/21/2015   right  . Primary sclerosing cholangitis    ? hepatitis overlap - liver bx x 2 and MRCP  . Seasonal allergies   . Shortness of breath    w/ anemia   Past Surgical History:  Procedure Laterality Date  . ABDOMINAL AORTAGRAM N/A 03/29/2012   Procedure: ABDOMINAL Maxcine Ham;  Surgeon: Serafina Mitchell, MD;  Location[facility] CATH LAB;  Service: Cardiovascular;  Laterality: N/A;  . ADENOIDECTOMY  age 74  . CATARACT EXTRACTION W/ INTRAOCULAR LENS  IMPLANT, BILATERAL  2009  . COLONOSCOPY  2001, 05/02/2003, 01/28/11   2012: Right colon Crohn's, rectal polyp  . COLONOSCOPY  03/31/2012   Procedure: COLONOSCOPY;  Surgeon: Jerene Bears, MD;  Location: [place];  Service: Gastroenterology;  Laterality: N/A;  . ESOPHAGOGASTRODUODENOSCOPY  01/28/11   Normal  . FOOT SURGERY Right age 49  . MOHS SURGERY Left 11/2013   left ankle parakerotosis   . PERCUTANEOUS LIVER BIOPSY  2007 and 2008  . PILONIDAL CYST EXCISION  age 68  . PLACEMENT OF SETON N/A 11/06/2015   Procedure: PLACEMENT OF SETON;  Surgeon: [name], MD;  Location: [place];  Service: General;  Laterality: N/A;    Social history and family history also reviewed but not recorded here Review of Systems Irritable Frustrated  Objective:   Physical Exam BP 126/80 (BP Location: Left Arm, Patient Position: Sitting,  Cuff Size: Normal)   Pulse 100   Ht 6' 1.25" (1.861 m)   Wt 202 lb 6 oz (91.8 kg)   BMI 26.52 kg/m  The patient is somewhat pale and in no acute distress not sure if his any paler than usual although He is alert and oriented 3 His irritable but appropriate Eyes are anicteric Lungs are clear Heart sounds show S1-S2 no rubs murmurs or gallops Abdomen is soft and nontender without hepatomegaly no splenomegaly no masses Inspection of the perianal and rectal area shows the seton in place in the right posterior region of the perianal area there is no nodule is no per drainage there is no perianal fluctuance  CBC  Latest Ref Rng & Units 06/24/2016 05/18/2016 03/25/2016  WBC 4.0 - 10.5 K/uL 13.8(H) 16.0(H) 14.2(H)  Hemoglobin 13.0 - 17.0 g/dL 10.1(L) 11.9(L) 10.9(L)  Hematocrit 39.0 - 52.0 % 32.5(L) 37.2(L) 33.8(L)  Platelets 150.0 - 400.0 K/uL 379.0 339.0 338.0   Lab Results  Component Value Date   FERRITIN 10.6 (L) 06/24/2016   Lab Results  Component Value Date   VITAMINB12 998 (H) 04/06/2012

## 2016-07-09 NOTE — Assessment & Plan Note (Signed)
He has been compliant through the years as best I can tell, we did have him sign a contract today.

## 2016-07-09 NOTE — Assessment & Plan Note (Signed)
Has been ok

## 2016-07-09 NOTE — Patient Instructions (Signed)
Your physician has requested that you go to the basement for the following lab work before leaving today: CBC/diff, B12, CMET, Ferritin   We will be in touch with results and plans.    I appreciate the opportunity to care for you. Silvano Rusk, MD, [facility]

## 2016-07-09 NOTE — Assessment & Plan Note (Signed)
Delphina Cahill is working

## 2016-07-10 ENCOUNTER — Encounter: Payer: Self-pay | Admitting: Internal Medicine

## 2016-07-10 DIAGNOSIS — N529 Male erectile dysfunction, unspecified: Secondary | ICD-10-CM

## 2016-07-12 NOTE — Progress Notes (Signed)
Presume he continues to leak blood from Crohn's - so repeat Feraheme x 2 for chronic blood loss anemia  Anticipate changing Treatment from Humira vs colonoscopy or both - he has been reluctant to repeat a colonoscopy yet  Please see if Stelara or Entyvio are options for him to treat his Crohn's after failing Humira or if we need to try Remicade (I do not think he has had that)

## 2016-07-13 ENCOUNTER — Encounter: Payer: Self-pay | Admitting: Internal Medicine

## 2016-07-13 ENCOUNTER — Other Ambulatory Visit: Payer: Self-pay

## 2016-07-13 DIAGNOSIS — K50113 Crohn's disease of large intestine with fistula: Secondary | ICD-10-CM

## 2016-07-13 DIAGNOSIS — D508 Other iron deficiency anemias: Secondary | ICD-10-CM

## 2016-07-14 DIAGNOSIS — M5136 Other intervertebral disc degeneration, lumbar region: Secondary | ICD-10-CM | POA: Diagnosis not present

## 2016-07-14 DIAGNOSIS — M9901 Segmental and somatic dysfunction of cervical region: Secondary | ICD-10-CM | POA: Diagnosis not present

## 2016-07-14 DIAGNOSIS — M5411 Radiculopathy, occipito-atlanto-axial region: Secondary | ICD-10-CM | POA: Diagnosis not present

## 2016-07-14 DIAGNOSIS — M9903 Segmental and somatic dysfunction of lumbar region: Secondary | ICD-10-CM | POA: Diagnosis not present

## 2016-07-14 NOTE — Progress Notes (Signed)
Tell him he should switch to the entyvio 300 mg IV 0,2,6 and 8 weeks _ Entyvio in theory if not practically should be safer than Humira as it targets gut only DC Humira  CBC and ferritin in 1 month after second feraheme  See me in 2 mos

## 2016-07-16 ENCOUNTER — Other Ambulatory Visit: Payer: Self-pay | Admitting: Internal Medicine

## 2016-07-19 ENCOUNTER — Other Ambulatory Visit (HOSPITAL_COMMUNITY): Payer: Self-pay | Admitting: *Deleted

## 2016-07-20 ENCOUNTER — Ambulatory Visit (HOSPITAL_COMMUNITY)
Admission: RE | Admit: 2016-07-20 | Discharge: 2016-07-20 | Disposition: A | Discharge status: Home / Self Care | Payer: Medicare Other | Source: Ambulatory Visit | Attending: Internal Medicine | Admitting: Internal Medicine

## 2016-07-20 DIAGNOSIS — D508 Other iron deficiency anemias: Secondary | ICD-10-CM | POA: Insufficient documentation

## 2016-07-20 DIAGNOSIS — K50113 Crohn's disease of large intestine with fistula: Secondary | ICD-10-CM | POA: Insufficient documentation

## 2016-07-20 MED ORDER — SODIUM CHLORIDE 0.9 % IV SOLN
510.0000 mg | INTRAVENOUS | Status: DC
  Administered 2016-07-20: 510 mg via INTRAVENOUS
  Filled 2016-07-20: qty 17

## 2016-07-21 ENCOUNTER — Encounter: Payer: Self-pay | Admitting: Internal Medicine

## 2016-07-21 DIAGNOSIS — M5136 Other intervertebral disc degeneration, lumbar region: Secondary | ICD-10-CM | POA: Diagnosis not present

## 2016-07-21 DIAGNOSIS — M9901 Segmental and somatic dysfunction of cervical region: Secondary | ICD-10-CM | POA: Diagnosis not present

## 2016-07-21 DIAGNOSIS — M5411 Radiculopathy, occipito-atlanto-axial region: Secondary | ICD-10-CM | POA: Diagnosis not present

## 2016-07-21 DIAGNOSIS — M9903 Segmental and somatic dysfunction of lumbar region: Secondary | ICD-10-CM | POA: Diagnosis not present

## 2016-07-22 ENCOUNTER — Other Ambulatory Visit: Payer: Self-pay | Admitting: Internal Medicine

## 2016-07-22 ENCOUNTER — Encounter: Payer: Self-pay | Admitting: Internal Medicine

## 2016-07-22 ENCOUNTER — Telehealth: Payer: Self-pay

## 2016-07-22 DIAGNOSIS — K50819 Crohn's disease of both small and large intestine with unspecified complications: Secondary | ICD-10-CM

## 2016-07-22 DIAGNOSIS — K602 Anal fissure, unspecified: Secondary | ICD-10-CM

## 2016-07-22 MED ORDER — HYDROCODONE-ACETAMINOPHEN 5-325 MG PO TABS: 1.0000 | ORAL_TABLET | Freq: Four times a day (QID) | ORAL | 0 refills | Status: DC | PRN

## 2016-07-22 NOTE — Telephone Encounter (Signed)
I contacted the patient.  He has Medicare and would like to exhaust all options for [facility] before looking at [facility].  I walked him through filling out the [place] connects on line and he will submit it today to see if he qualifies for assistance through [employer].  He will keep me updated by call or MyChart on the process and if he qualifies or he is able to afford. He would like to infuse at [facility] if possible. I will await a return communication from him.  He is aware that he will most likely need to provide financial documents to [facility].

## 2016-07-22 NOTE — Telephone Encounter (Signed)
-----   Message from [name], MD sent at 07/22/2016  4:20 PM EDT ----- Regarding: Keane Scrape says it will be $#K and he pays 20% at [place] for [facility] - he gets $105 bill on iron injection that charge is $2K soe he was asking if hospital cheaper  I explained didn't think so  Can you ask [name] rep what she knows about the cost for him and anything we can do?  Also could consider Stelara

## 2016-07-22 NOTE — Telephone Encounter (Signed)
Thanks

## 2016-07-27 ENCOUNTER — Ambulatory Visit (HOSPITAL_COMMUNITY)
Admission: RE | Admit: 2016-07-27 | Discharge: 2016-07-27 | Disposition: A | Discharge status: Home / Self Care | Payer: Medicare Other | Source: Ambulatory Visit | Attending: Internal Medicine | Admitting: Internal Medicine

## 2016-07-27 DIAGNOSIS — K50113 Crohn's disease of large intestine with fistula: Secondary | ICD-10-CM | POA: Diagnosis not present

## 2016-07-27 DIAGNOSIS — D508 Other iron deficiency anemias: Secondary | ICD-10-CM

## 2016-07-27 MED ORDER — SODIUM CHLORIDE 0.9 % IV SOLN
510.0000 mg | INTRAVENOUS | Status: AC
  Administered 2016-07-27: 510 mg via INTRAVENOUS
  Filled 2016-07-27: qty 17

## 2016-07-29 DIAGNOSIS — M5136 Other intervertebral disc degeneration, lumbar region: Secondary | ICD-10-CM | POA: Diagnosis not present

## 2016-07-29 DIAGNOSIS — M5411 Radiculopathy, occipito-atlanto-axial region: Secondary | ICD-10-CM | POA: Diagnosis not present

## 2016-07-29 DIAGNOSIS — M9901 Segmental and somatic dysfunction of cervical region: Secondary | ICD-10-CM | POA: Diagnosis not present

## 2016-07-29 DIAGNOSIS — M9903 Segmental and somatic dysfunction of lumbar region: Secondary | ICD-10-CM | POA: Diagnosis not present

## 2016-08-05 DIAGNOSIS — M9901 Segmental and somatic dysfunction of cervical region: Secondary | ICD-10-CM | POA: Diagnosis not present

## 2016-08-05 DIAGNOSIS — M5136 Other intervertebral disc degeneration, lumbar region: Secondary | ICD-10-CM | POA: Diagnosis not present

## 2016-08-05 DIAGNOSIS — M5411 Radiculopathy, occipito-atlanto-axial region: Secondary | ICD-10-CM | POA: Diagnosis not present

## 2016-08-05 DIAGNOSIS — M9903 Segmental and somatic dysfunction of lumbar region: Secondary | ICD-10-CM | POA: Diagnosis not present

## 2016-08-10 DIAGNOSIS — Z79899 Other long term (current) drug therapy: Secondary | ICD-10-CM | POA: Diagnosis not present

## 2016-08-10 DIAGNOSIS — M2550 Pain in unspecified joint: Secondary | ICD-10-CM | POA: Diagnosis not present

## 2016-08-10 DIAGNOSIS — K5090 Crohn's disease, unspecified, without complications: Secondary | ICD-10-CM | POA: Diagnosis not present

## 2016-08-11 DIAGNOSIS — M5411 Radiculopathy, occipito-atlanto-axial region: Secondary | ICD-10-CM | POA: Diagnosis not present

## 2016-08-11 DIAGNOSIS — M9901 Segmental and somatic dysfunction of cervical region: Secondary | ICD-10-CM | POA: Diagnosis not present

## 2016-08-11 DIAGNOSIS — M9903 Segmental and somatic dysfunction of lumbar region: Secondary | ICD-10-CM | POA: Diagnosis not present

## 2016-08-11 DIAGNOSIS — M5136 Other intervertebral disc degeneration, lumbar region: Secondary | ICD-10-CM | POA: Diagnosis not present

## 2016-08-12 DIAGNOSIS — K50113 Crohn's disease of large intestine with fistula: Secondary | ICD-10-CM | POA: Diagnosis not present

## 2016-08-18 ENCOUNTER — Encounter: Payer: Self-pay | Admitting: Internal Medicine

## 2016-08-18 ENCOUNTER — Other Ambulatory Visit: Payer: Self-pay | Admitting: Internal Medicine

## 2016-08-18 DIAGNOSIS — K602 Anal fissure, unspecified: Secondary | ICD-10-CM

## 2016-08-18 DIAGNOSIS — K50819 Crohn's disease of both small and large intestine with unspecified complications: Secondary | ICD-10-CM

## 2016-08-18 MED ORDER — HYDROCODONE-ACETAMINOPHEN 5-325 MG PO TABS: 1.0000 | ORAL_TABLET | Freq: Four times a day (QID) | ORAL | 0 refills | Status: DC | PRN

## 2016-08-19 DIAGNOSIS — M9903 Segmental and somatic dysfunction of lumbar region: Secondary | ICD-10-CM | POA: Diagnosis not present

## 2016-08-19 DIAGNOSIS — M5411 Radiculopathy, occipito-atlanto-axial region: Secondary | ICD-10-CM | POA: Diagnosis not present

## 2016-08-19 DIAGNOSIS — M5136 Other intervertebral disc degeneration, lumbar region: Secondary | ICD-10-CM | POA: Diagnosis not present

## 2016-08-19 DIAGNOSIS — M9901 Segmental and somatic dysfunction of cervical region: Secondary | ICD-10-CM | POA: Diagnosis not present

## 2016-08-20 ENCOUNTER — Encounter: Payer: Self-pay | Admitting: Internal Medicine

## 2016-08-23 ENCOUNTER — Encounter: Payer: Self-pay | Admitting: Internal Medicine

## 2016-08-26 ENCOUNTER — Other Ambulatory Visit: Payer: Self-pay | Admitting: Internal Medicine

## 2016-08-26 DIAGNOSIS — D225 Melanocytic nevi of trunk: Secondary | ICD-10-CM | POA: Diagnosis not present

## 2016-08-26 DIAGNOSIS — C44712 Basal cell carcinoma of skin of right lower limb, including hip: Secondary | ICD-10-CM | POA: Diagnosis not present

## 2016-08-26 DIAGNOSIS — Z85828 Personal history of other malignant neoplasm of skin: Secondary | ICD-10-CM | POA: Diagnosis not present

## 2016-08-26 DIAGNOSIS — D1801 Hemangioma of skin and subcutaneous tissue: Secondary | ICD-10-CM | POA: Diagnosis not present

## 2016-08-26 DIAGNOSIS — D2272 Melanocytic nevi of left lower limb, including hip: Secondary | ICD-10-CM | POA: Diagnosis not present

## 2016-08-26 DIAGNOSIS — L821 Other seborrheic keratosis: Secondary | ICD-10-CM | POA: Diagnosis not present

## 2016-08-26 DIAGNOSIS — L570 Actinic keratosis: Secondary | ICD-10-CM | POA: Diagnosis not present

## 2016-08-26 DIAGNOSIS — M9901 Segmental and somatic dysfunction of cervical region: Secondary | ICD-10-CM | POA: Diagnosis not present

## 2016-08-26 DIAGNOSIS — M5136 Other intervertebral disc degeneration, lumbar region: Secondary | ICD-10-CM | POA: Diagnosis not present

## 2016-08-26 DIAGNOSIS — D2271 Melanocytic nevi of right lower limb, including hip: Secondary | ICD-10-CM | POA: Diagnosis not present

## 2016-08-26 DIAGNOSIS — L565 Disseminated superficial actinic porokeratosis (DSAP): Secondary | ICD-10-CM | POA: Diagnosis not present

## 2016-08-26 DIAGNOSIS — M5411 Radiculopathy, occipito-atlanto-axial region: Secondary | ICD-10-CM | POA: Diagnosis not present

## 2016-08-26 DIAGNOSIS — M9903 Segmental and somatic dysfunction of lumbar region: Secondary | ICD-10-CM | POA: Diagnosis not present

## 2016-08-30 DIAGNOSIS — K5090 Crohn's disease, unspecified, without complications: Secondary | ICD-10-CM | POA: Diagnosis not present

## 2016-09-02 DIAGNOSIS — M9903 Segmental and somatic dysfunction of lumbar region: Secondary | ICD-10-CM | POA: Diagnosis not present

## 2016-09-02 DIAGNOSIS — M9901 Segmental and somatic dysfunction of cervical region: Secondary | ICD-10-CM | POA: Diagnosis not present

## 2016-09-02 DIAGNOSIS — M5136 Other intervertebral disc degeneration, lumbar region: Secondary | ICD-10-CM | POA: Diagnosis not present

## 2016-09-02 DIAGNOSIS — M5411 Radiculopathy, occipito-atlanto-axial region: Secondary | ICD-10-CM | POA: Diagnosis not present

## 2016-09-09 DIAGNOSIS — M5411 Radiculopathy, occipito-atlanto-axial region: Secondary | ICD-10-CM | POA: Diagnosis not present

## 2016-09-09 DIAGNOSIS — M9901 Segmental and somatic dysfunction of cervical region: Secondary | ICD-10-CM | POA: Diagnosis not present

## 2016-09-09 DIAGNOSIS — M9903 Segmental and somatic dysfunction of lumbar region: Secondary | ICD-10-CM | POA: Diagnosis not present

## 2016-09-09 DIAGNOSIS — M5136 Other intervertebral disc degeneration, lumbar region: Secondary | ICD-10-CM | POA: Diagnosis not present

## 2016-09-12 ENCOUNTER — Encounter: Payer: Self-pay | Admitting: Internal Medicine

## 2016-09-13 DIAGNOSIS — K5090 Crohn's disease, unspecified, without complications: Secondary | ICD-10-CM | POA: Diagnosis not present

## 2016-09-14 ENCOUNTER — Other Ambulatory Visit: Payer: Self-pay | Admitting: Internal Medicine

## 2016-09-14 DIAGNOSIS — K50819 Crohn's disease of both small and large intestine with unspecified complications: Secondary | ICD-10-CM

## 2016-09-14 DIAGNOSIS — D500 Iron deficiency anemia secondary to blood loss (chronic): Secondary | ICD-10-CM

## 2016-09-14 DIAGNOSIS — K602 Anal fissure, unspecified: Secondary | ICD-10-CM

## 2016-09-14 MED ORDER — HYDROCODONE-ACETAMINOPHEN 5-325 MG PO TABS: 1.0000 | ORAL_TABLET | Freq: Four times a day (QID) | ORAL | 0 refills | Status: DC | PRN

## 2016-09-15 ENCOUNTER — Other Ambulatory Visit (INDEPENDENT_AMBULATORY_CARE_PROVIDER_SITE_OTHER): Payer: Medicare Other

## 2016-09-15 DIAGNOSIS — D500 Iron deficiency anemia secondary to blood loss (chronic): Secondary | ICD-10-CM | POA: Diagnosis not present

## 2016-09-15 DIAGNOSIS — N529 Male erectile dysfunction, unspecified: Secondary | ICD-10-CM

## 2016-09-15 DIAGNOSIS — K50819 Crohn's disease of both small and large intestine with unspecified complications: Secondary | ICD-10-CM | POA: Diagnosis not present

## 2016-09-15 LAB — COMPREHENSIVE METABOLIC PANEL
ALBUMIN: 3 g/dL — AB (ref 3.5–5.2)
ALK PHOS: 66 U/L (ref 39–117)
ALT: 20 U/L (ref 0–53)
AST: 12 U/L (ref 0–37)
BILIRUBIN TOTAL: 0.3 mg/dL (ref 0.2–1.2)
BUN: 16 mg/dL (ref 6–23)
CALCIUM: 8.3 mg/dL — AB (ref 8.4–10.5)
CHLORIDE: 105 meq/L (ref 96–112)
CO2: 27 mEq/L (ref 19–32)
CREATININE: 1.23 mg/dL (ref 0.40–1.50)
GFR: 65.33 mL/min (ref >60.00)
Glucose, Bld: 95 mg/dL (ref 70–99)
Potassium: 3.6 mEq/L (ref 3.5–5.1)
SODIUM: 139 meq/L (ref 135–145)
TOTAL PROTEIN: 5.3 g/dL — AB (ref 6.0–8.3)

## 2016-09-15 LAB — CBC WITH DIFFERENTIAL/PLATELET
BASOS ABS: 0 10*3/uL (ref 0.0–0.1)
Basophils Relative: 0.4 % (ref 0.0–3.0)
EOS ABS: 0.3 10*3/uL (ref 0.0–0.7)
Eosinophils Relative: 2.1 % (ref 0.0–5.0)
HEMATOCRIT: 34.2 % — AB (ref 39.0–52.0)
HEMOGLOBIN: 10.6 g/dL — AB (ref 13.0–17.0)
LYMPHS PCT: 18.8 % (ref 12.0–46.0)
Lymphs Abs: 2.6 10*3/uL (ref 0.7–4.0)
MCHC: 31 g/dL (ref 30.0–36.0)
MCV: 76.7 fl — ABNORMAL LOW (ref 78.0–100.0)
MONOS PCT: 13.4 % — AB (ref 3.0–12.0)
Monocytes Absolute: 1.8 10*3/uL — ABNORMAL HIGH (ref 0.1–1.0)
NEUTROS ABS: 8.9 10*3/uL — AB (ref 1.4–7.7)
Neutrophils Relative %: 65.3 % (ref 43.0–77.0)
Platelets: 344 10*3/uL (ref 150.0–400.0)
RBC: 4.47 Mil/uL (ref 4.22–5.81)
RDW: 24.6 % — AB (ref 11.5–15.5)
WBC: 13.6 10*3/uL — AB (ref 4.0–10.5)

## 2016-09-15 LAB — FERRITIN: Ferritin: 17.5 ng/mL — ABNORMAL LOW (ref 22.0–322.0)

## 2016-09-15 LAB — TESTOSTERONE: TESTOSTERONE: 229.21 ng/dL — AB (ref 300.00–890.00)

## 2016-09-16 DIAGNOSIS — M9903 Segmental and somatic dysfunction of lumbar region: Secondary | ICD-10-CM | POA: Diagnosis not present

## 2016-09-16 DIAGNOSIS — M5411 Radiculopathy, occipito-atlanto-axial region: Secondary | ICD-10-CM | POA: Diagnosis not present

## 2016-09-16 DIAGNOSIS — M9901 Segmental and somatic dysfunction of cervical region: Secondary | ICD-10-CM | POA: Diagnosis not present

## 2016-09-16 DIAGNOSIS — M5136 Other intervertebral disc degeneration, lumbar region: Secondary | ICD-10-CM | POA: Diagnosis not present

## 2016-09-17 ENCOUNTER — Other Ambulatory Visit: Payer: Self-pay | Admitting: Internal Medicine

## 2016-09-17 ENCOUNTER — Encounter: Payer: Self-pay | Admitting: Internal Medicine

## 2016-09-17 DIAGNOSIS — D500 Iron deficiency anemia secondary to blood loss (chronic): Secondary | ICD-10-CM

## 2016-09-17 NOTE — Progress Notes (Signed)
My chart note

## 2016-09-22 DIAGNOSIS — R5381 Other malaise: Secondary | ICD-10-CM | POA: Diagnosis not present

## 2016-09-22 DIAGNOSIS — E291 Testicular hypofunction: Secondary | ICD-10-CM | POA: Diagnosis not present

## 2016-09-22 DIAGNOSIS — E559 Vitamin D deficiency, unspecified: Secondary | ICD-10-CM | POA: Diagnosis not present

## 2016-09-22 DIAGNOSIS — F322 Major depressive disorder, single episode, severe without psychotic features: Secondary | ICD-10-CM | POA: Diagnosis not present

## 2016-09-23 DIAGNOSIS — M5411 Radiculopathy, occipito-atlanto-axial region: Secondary | ICD-10-CM | POA: Diagnosis not present

## 2016-09-23 DIAGNOSIS — M5136 Other intervertebral disc degeneration, lumbar region: Secondary | ICD-10-CM | POA: Diagnosis not present

## 2016-09-23 DIAGNOSIS — M9903 Segmental and somatic dysfunction of lumbar region: Secondary | ICD-10-CM | POA: Diagnosis not present

## 2016-09-23 DIAGNOSIS — M9901 Segmental and somatic dysfunction of cervical region: Secondary | ICD-10-CM | POA: Diagnosis not present

## 2016-09-30 DIAGNOSIS — M5136 Other intervertebral disc degeneration, lumbar region: Secondary | ICD-10-CM | POA: Diagnosis not present

## 2016-09-30 DIAGNOSIS — M9903 Segmental and somatic dysfunction of lumbar region: Secondary | ICD-10-CM | POA: Diagnosis not present

## 2016-09-30 DIAGNOSIS — M5411 Radiculopathy, occipito-atlanto-axial region: Secondary | ICD-10-CM | POA: Diagnosis not present

## 2016-09-30 DIAGNOSIS — M9901 Segmental and somatic dysfunction of cervical region: Secondary | ICD-10-CM | POA: Diagnosis not present

## 2016-10-07 ENCOUNTER — Encounter: Payer: Self-pay | Admitting: Internal Medicine

## 2016-10-07 DIAGNOSIS — M9903 Segmental and somatic dysfunction of lumbar region: Secondary | ICD-10-CM | POA: Diagnosis not present

## 2016-10-07 DIAGNOSIS — M5411 Radiculopathy, occipito-atlanto-axial region: Secondary | ICD-10-CM | POA: Diagnosis not present

## 2016-10-07 DIAGNOSIS — M5136 Other intervertebral disc degeneration, lumbar region: Secondary | ICD-10-CM | POA: Diagnosis not present

## 2016-10-07 DIAGNOSIS — M9901 Segmental and somatic dysfunction of cervical region: Secondary | ICD-10-CM | POA: Diagnosis not present

## 2016-10-11 ENCOUNTER — Encounter: Payer: Self-pay | Admitting: Internal Medicine

## 2016-10-11 DIAGNOSIS — K5090 Crohn's disease, unspecified, without complications: Secondary | ICD-10-CM | POA: Diagnosis not present

## 2016-10-12 ENCOUNTER — Other Ambulatory Visit (INDEPENDENT_AMBULATORY_CARE_PROVIDER_SITE_OTHER): Payer: Medicare Other

## 2016-10-12 ENCOUNTER — Other Ambulatory Visit: Payer: Self-pay | Admitting: Internal Medicine

## 2016-10-12 DIAGNOSIS — D500 Iron deficiency anemia secondary to blood loss (chronic): Secondary | ICD-10-CM

## 2016-10-12 DIAGNOSIS — K50819 Crohn's disease of both small and large intestine with unspecified complications: Secondary | ICD-10-CM

## 2016-10-12 DIAGNOSIS — K602 Anal fissure, unspecified: Secondary | ICD-10-CM

## 2016-10-12 LAB — CBC WITH DIFFERENTIAL/PLATELET
BASOS PCT: 0.2 % (ref 0.0–3.0)
Basophils Absolute: 0 10*3/uL (ref 0.0–0.1)
EOS ABS: 0.1 10*3/uL (ref 0.0–0.7)
EOS PCT: 1 % (ref 0.0–5.0)
HEMATOCRIT: 34.6 % — AB (ref 39.0–52.0)
HEMOGLOBIN: 10.4 g/dL — AB (ref 13.0–17.0)
LYMPHS PCT: 17.6 % (ref 12.0–46.0)
Lymphs Abs: 2.6 10*3/uL (ref 0.7–4.0)
MCHC: 30.2 g/dL (ref 30.0–36.0)
MCV: 73 fl — ABNORMAL LOW (ref 78.0–100.0)
MONO ABS: 1.5 10*3/uL — AB (ref 0.1–1.0)
Monocytes Relative: 10 % (ref 3.0–12.0)
NEUTROS ABS: 10.6 10*3/uL — AB (ref 1.4–7.7)
Neutrophils Relative %: 71.2 % (ref 43.0–77.0)
PLATELETS: 341 10*3/uL (ref 150.0–400.0)
RBC: 4.74 Mil/uL (ref 4.22–5.81)
RDW: 21.8 % — AB (ref 11.5–15.5)
WBC: 14.8 10*3/uL — ABNORMAL HIGH (ref 4.0–10.5)

## 2016-10-12 MED ORDER — HYDROCODONE-ACETAMINOPHEN 5-325 MG PO TABS: 1.0000 | ORAL_TABLET | Freq: Four times a day (QID) | ORAL | 0 refills | Status: DC | PRN

## 2016-10-14 ENCOUNTER — Encounter: Payer: Self-pay | Admitting: Internal Medicine

## 2016-10-14 DIAGNOSIS — M5136 Other intervertebral disc degeneration, lumbar region: Secondary | ICD-10-CM | POA: Diagnosis not present

## 2016-10-14 DIAGNOSIS — M5411 Radiculopathy, occipito-atlanto-axial region: Secondary | ICD-10-CM | POA: Diagnosis not present

## 2016-10-14 DIAGNOSIS — M9903 Segmental and somatic dysfunction of lumbar region: Secondary | ICD-10-CM | POA: Diagnosis not present

## 2016-10-14 DIAGNOSIS — M9901 Segmental and somatic dysfunction of cervical region: Secondary | ICD-10-CM | POA: Diagnosis not present

## 2016-10-14 NOTE — Progress Notes (Signed)
Hgb stable My Chart

## 2016-10-18 ENCOUNTER — Encounter: Payer: Self-pay | Admitting: Internal Medicine

## 2016-10-20 ENCOUNTER — Telehealth: Payer: Self-pay

## 2016-10-20 ENCOUNTER — Other Ambulatory Visit: Payer: Self-pay

## 2016-10-20 DIAGNOSIS — D508 Other iron deficiency anemias: Secondary | ICD-10-CM

## 2016-10-20 NOTE — Telephone Encounter (Signed)
-----   Message from [name], MD sent at 10/19/2016  5:01 PM EDT ----- Regarding: Feraheme and office visit 1) Ferheme x 2 re: iron def anemia due to chronic blood loss  2) Schedule f/u me for sept somewhere please

## 2016-10-20 NOTE — Telephone Encounter (Signed)
MYChart message sent to the patient with the appt details of Feraheme 8/30 10:00 and OV 11/08/16

## 2016-10-21 DIAGNOSIS — M9901 Segmental and somatic dysfunction of cervical region: Secondary | ICD-10-CM | POA: Diagnosis not present

## 2016-10-21 DIAGNOSIS — M9903 Segmental and somatic dysfunction of lumbar region: Secondary | ICD-10-CM | POA: Diagnosis not present

## 2016-10-21 DIAGNOSIS — M5411 Radiculopathy, occipito-atlanto-axial region: Secondary | ICD-10-CM | POA: Diagnosis not present

## 2016-10-21 DIAGNOSIS — M5136 Other intervertebral disc degeneration, lumbar region: Secondary | ICD-10-CM | POA: Diagnosis not present

## 2016-10-28 ENCOUNTER — Ambulatory Visit (HOSPITAL_COMMUNITY)
Admission: RE | Admit: 2016-10-28 | Discharge: 2016-10-28 | Disposition: A | Discharge status: Home / Self Care | Payer: Medicare Other | Source: Ambulatory Visit | Attending: Internal Medicine | Admitting: Internal Medicine

## 2016-10-28 DIAGNOSIS — D508 Other iron deficiency anemias: Secondary | ICD-10-CM | POA: Diagnosis not present

## 2016-10-28 DIAGNOSIS — M5136 Other intervertebral disc degeneration, lumbar region: Secondary | ICD-10-CM | POA: Diagnosis not present

## 2016-10-28 DIAGNOSIS — M9901 Segmental and somatic dysfunction of cervical region: Secondary | ICD-10-CM | POA: Diagnosis not present

## 2016-10-28 DIAGNOSIS — M5411 Radiculopathy, occipito-atlanto-axial region: Secondary | ICD-10-CM | POA: Diagnosis not present

## 2016-10-28 DIAGNOSIS — M9903 Segmental and somatic dysfunction of lumbar region: Secondary | ICD-10-CM | POA: Diagnosis not present

## 2016-10-28 MED ORDER — SODIUM CHLORIDE 0.9 % IV SOLN
510.0000 mg | INTRAVENOUS | Status: DC
  Administered 2016-10-28: 510 mg via INTRAVENOUS
  Filled 2016-10-28: qty 17

## 2016-11-03 ENCOUNTER — Other Ambulatory Visit (HOSPITAL_COMMUNITY): Payer: Self-pay | Admitting: *Deleted

## 2016-11-03 ENCOUNTER — Encounter: Payer: Self-pay | Admitting: Internal Medicine

## 2016-11-03 DIAGNOSIS — M5136 Other intervertebral disc degeneration, lumbar region: Secondary | ICD-10-CM | POA: Diagnosis not present

## 2016-11-03 DIAGNOSIS — M9901 Segmental and somatic dysfunction of cervical region: Secondary | ICD-10-CM | POA: Diagnosis not present

## 2016-11-03 DIAGNOSIS — M9903 Segmental and somatic dysfunction of lumbar region: Secondary | ICD-10-CM | POA: Diagnosis not present

## 2016-11-03 DIAGNOSIS — M5411 Radiculopathy, occipito-atlanto-axial region: Secondary | ICD-10-CM | POA: Diagnosis not present

## 2016-11-04 ENCOUNTER — Ambulatory Visit (HOSPITAL_COMMUNITY)
Admission: RE | Admit: 2016-11-04 | Discharge: 2016-11-04 | Disposition: A | Discharge status: Home / Self Care | Payer: Medicare Other | Source: Ambulatory Visit | Attending: Internal Medicine | Admitting: Internal Medicine

## 2016-11-04 DIAGNOSIS — D500 Iron deficiency anemia secondary to blood loss (chronic): Secondary | ICD-10-CM | POA: Diagnosis not present

## 2016-11-04 MED ORDER — SODIUM CHLORIDE 0.9 % IV SOLN
510.0000 mg | Freq: Once | INTRAVENOUS | Status: AC
  Administered 2016-11-04: 510 mg via INTRAVENOUS
  Filled 2016-11-04: qty 17

## 2016-11-05 ENCOUNTER — Other Ambulatory Visit: Payer: Self-pay

## 2016-11-05 DIAGNOSIS — K602 Anal fissure, unspecified: Secondary | ICD-10-CM

## 2016-11-05 DIAGNOSIS — K50819 Crohn's disease of both small and large intestine with unspecified complications: Secondary | ICD-10-CM

## 2016-11-05 MED ORDER — HYDROCODONE-ACETAMINOPHEN 5-325 MG PO TABS: 1.0000 | ORAL_TABLET | Freq: Four times a day (QID) | ORAL | 0 refills | Status: DC | PRN

## 2016-11-05 NOTE — Telephone Encounter (Signed)
I walked over to the [facility] ENDO unit and had Dr Carlean Purl sign the vicodin rx while [name] waited.  He was appreciative for your help Sir.

## 2016-11-08 ENCOUNTER — Encounter: Payer: Self-pay | Admitting: Internal Medicine

## 2016-11-08 ENCOUNTER — Ambulatory Visit (INDEPENDENT_AMBULATORY_CARE_PROVIDER_SITE_OTHER): Payer: Medicare Other | Admitting: Internal Medicine

## 2016-11-08 DIAGNOSIS — K604 Rectal fistula: Secondary | ICD-10-CM | POA: Diagnosis not present

## 2016-11-08 DIAGNOSIS — D500 Iron deficiency anemia secondary to blood loss (chronic): Secondary | ICD-10-CM | POA: Diagnosis not present

## 2016-11-08 DIAGNOSIS — K754 Autoimmune hepatitis: Secondary | ICD-10-CM | POA: Diagnosis not present

## 2016-11-08 DIAGNOSIS — K50813 Crohn's disease of both small and large intestine with fistula: Secondary | ICD-10-CM

## 2016-11-08 NOTE — Patient Instructions (Addendum)
  Please come and get labs drawn the week of October 8th.  They are open Mon-Friday 7:30AM- 5:30PM.    Follow up with Dr Carlean Purl in January.  Call in late November.   I appreciate the opportunity to care for you. Silvano Rusk, MD, [facility]

## 2016-11-08 NOTE — Assessment & Plan Note (Signed)
LFT's are NL - probably due to prednisone

## 2016-11-08 NOTE — Progress Notes (Signed)
[name] 51 y.o. 06/08/1965 [id number]  Assessment & Plan:   CROHN'S DISEASE, LARGE AND SMALL INTESTINES Cntinue entyvio F/U January  Anemia due to chronic blood loss CBC and ferritin 10/8 approximately  Perirectal fistula Seton out - draining - less pain Using hydrocodone for the pain On DRE some rectal stritcuring  Autoimmune hepatitis-PSC overlap LFT's are NL - probably due to prednisone   He will consider Prevnar and shingles vaccines. I have recommended influenza vaccine when available  I appreciate the opportunity to care for this patient. CC: [name], MD   Subjective:   Chief Complaint:Follow-up of Crohn's anemia autoimmune liver disease Last seen in May 2018 HPI [name] is here for follow-up. He recently had a cavity filled, it was causing increased pain he received his hydrocodone prescription a few days early. After the feeling that is better but he thinks he'll need more dental work done. Still has intermittent diarrhea but says if he eats chicken and rice he'll have solid stools. He uses a sitz bath, his seton is out, his fistula still drained some purulent material but there is no pain or abscess associated with that portion of it though he does have rectal pain still which is why he takes the hydrocodone. He is about 2-3 months into his Entyvio. Remains on prednisone at 30 mg a day but says if he goes low his have increased diarrhea and rectal bleeding. He just had Feraheme infusion times 04/08/1928 and 96 for recurrent iron deficiency anemia. Energy level and mood seemed to be okay at this point or at least improved. Allergies  Allergen Reactions  . Asacol [Mesalamine] Diarrhea    abd pain  . Flagyl [Metronidazole] Diarrhea    abd pain  . Azathioprine Diarrhea    abd pain  . Gluten Meal Diarrhea  . Mycophenolate Mofetil Diarrhea    abd pain  . Other     NUTS; constipation  . Wheat Bran Diarrhea    Constipation; flatulence; abd  pain  . Tape Itching and Rash    Please use "paper" tape   Current Meds  Medication Sig  . ascorbic acid (VITAMIN C) 1000 MG tablet Take 2,000 mg by mouth daily.   . Calcium Carb-Cholecalciferol (CALCIUM 1000 + D) 1000-800 MG-UNIT TABS Take by mouth. Takes 2 (204m) tabs bid  . cholecalciferol (VITAMIN D) 1000 UNITS tablet Take 4,000 Units by mouth 2 (two) times daily.   . clomiPHENE (CLOMID) 50 MG tablet Take 25 mg by mouth every 3 (three) days.  . clonazePAM (KLONOPIN) 0.5 MG tablet Take 1 tablet (0.5 mg total) by mouth at bedtime as needed (insomnia).  .Marland Kitchendiltiazem 2 % GEL Apply a pea size amount into rectum 2 times a day  . fluticasone (FLONASE) 50 MCG/ACT nasal spray Place 2 sprays into the nose every morning. Each nostril   . HUMIRA PEN 40 MG/0.8ML PNKT INJECT 40 MG INTO THE SKIN EVERY 14 DAYS AFTER COMPLETION OF STARTER KIT  . HYDROcodone-acetaminophen (NORCO/VICODIN) 5-325 MG tablet Take 1-2 tablets by mouth every 6 (six) hours as needed for moderate pain.  . mirtazapine (REMERON) 30 MG tablet Take 30 mg by mouth at bedtime.  . predniSONE (DELTASONE) 20 MG tablet TAKE 1.25 TABLETS ONCE DAILY THEN USE AS DIRECTED BY MD  . PROCTOSOL HC 2.5 % rectal cream APPLY RECTALLY 2 TIMES DAILY AS NEEDED FOR HEMORRHOIDS OR ITCHING  . vitamin A 25000 UNIT capsule Take 25,000 Units by mouth daily.   Past Medical  History:  Diagnosis Date  . Anal fistula   . Anxiety   . 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 Autoimmune hepatitis ([place]) 01/19/2013  . Chronic mesenteric ischemia ([place])   . Chronic pain syndrome 07/09/2016  . Crohn's disease of small and large intestines ([place])    followed by dr Glendell Docker   . Dairy product intolerance   . Diarrhea, functional   . Family history of adverse reaction to anesthesia   . Grover's disease    transient acantholytic dermatosis  . History of alcohol abuse   . History of basal cell carcinoma excision    2013 left leg  . History of Clostridium  difficile    10/ 2014  . History of multiple concussions    x6   last one Jan 2017 per pt--  no residual  . History of substance abuse    quit 1997 per pt  . History of suicide attempt    05-18-2012  overdose /  failure to thrive  . Hypercholesterolemia   . Iron deficiency anemia due to chronic blood loss   . Major depression, recurrent, chronic ([place])   . Osteopenia   . Personal history of adenomatous colonic polyps 12/2010, 03/2012   12/2010 - 8 mm serrated adenoma of rectum  . Portal vein thrombosis 03/21/2015   right  . Primary sclerosing cholangitis    ? hepatitis overlap - liver bx x 2 and MRCP  . Seasonal allergies   . Shortness of breath    w/ anemia   Past Surgical History:  Procedure Laterality Date  . ABDOMINAL AORTAGRAM N/A 03/29/2012   Procedure: ABDOMINAL Maxcine Ham;  Surgeon: Serafina Mitchell, MD;  [facility] C[facility] LAB;  Service: Cardiovascular;  Laterality: N/A;  . ADENOIDECTOMY  age 61  . CATARACT EXTRACTION W/ INTRAOCULAR LENS  IMPLANT, BILATERAL  2009  . COLONOSCOPY  2001, 05/02/2003, 01/28/11   2012: Right colon Crohn's, rectal polyp  . COLONOSCOPY  03/31/2012   Procedure: COLONOSCOPY;  Surgeon: Jerene Bears, MD;  Location: [facility];  Service: Gastroenterology;  Laterality: N/A;  . ESOPHAGOGASTRODUODENOSCOPY  01/28/11   Normal  . FOOT SURGERY Right age 26  . MOHS SURGERY Left 11/2013   left ankle parakerotosis   . PERCUTANEOUS LIVER BIOPSY  2007 and 2008  . PILONIDAL CYST EXCISION  age 89  . PLACEMENT OF SETON N/A 11/06/2015   Procedure: PLACEMENT OF SETON;  Surgeon: [name], MD;  Location: [facility];  Service: General;  Laterality: N/A;   Social history and family history are reviewed in the EMR.    Review of Systems As above  Objective:   Physical Exam BP 122/78   Pulse 80   Ht 6' 2" (1.88 m)   Wt 203 lb (92.1 kg)   BMI 26.06 kg/m  Well-developed well-nourished mildly cushingoid and in no acute distress. Middle-aged white  man. The eyes are anicteric. The lungs are clear bilaterally Heart sounds are normal The abdomen is soft and nontender without organomegaly or mass Rectal exam shows open fistula status post fistulotomy the right posterior position. No fluctuance or mass or abscess but can express purulent material. Digital rectal exam is somewhat tender with indurated scarred stricturing, fifth digit is able to enter. Mood and affect appropriate  Lab Results  Component Value Date   HGB 10.4 (L) 10/12/2016   Lab Results  Component Value Date   FERRITIN 17.5 (L) 09/15/2016    Lab Results  Component Value Date  ALT 20 09/15/2016   AST 12 09/15/2016   ALKPHOS 66 09/15/2016   BILITOT 0.3 09/15/2016

## 2016-11-08 NOTE — Assessment & Plan Note (Addendum)
Seton out - draining - less pain Using hydrocodone for the pain On DRE some rectal stritcuring

## 2016-11-08 NOTE — Assessment & Plan Note (Signed)
Cntinue entyvio F/U January

## 2016-11-08 NOTE — Assessment & Plan Note (Signed)
CBC and ferritin 10/8 approximately

## 2016-11-10 DIAGNOSIS — M5136 Other intervertebral disc degeneration, lumbar region: Secondary | ICD-10-CM | POA: Diagnosis not present

## 2016-11-10 DIAGNOSIS — M9903 Segmental and somatic dysfunction of lumbar region: Secondary | ICD-10-CM | POA: Diagnosis not present

## 2016-11-10 DIAGNOSIS — M5411 Radiculopathy, occipito-atlanto-axial region: Secondary | ICD-10-CM | POA: Diagnosis not present

## 2016-11-10 DIAGNOSIS — M9901 Segmental and somatic dysfunction of cervical region: Secondary | ICD-10-CM | POA: Diagnosis not present

## 2016-11-18 ENCOUNTER — Encounter: Payer: Self-pay | Admitting: Internal Medicine

## 2016-11-18 DIAGNOSIS — M5136 Other intervertebral disc degeneration, lumbar region: Secondary | ICD-10-CM | POA: Diagnosis not present

## 2016-11-18 DIAGNOSIS — M9903 Segmental and somatic dysfunction of lumbar region: Secondary | ICD-10-CM | POA: Diagnosis not present

## 2016-11-18 DIAGNOSIS — M9901 Segmental and somatic dysfunction of cervical region: Secondary | ICD-10-CM | POA: Diagnosis not present

## 2016-11-18 DIAGNOSIS — M5411 Radiculopathy, occipito-atlanto-axial region: Secondary | ICD-10-CM | POA: Diagnosis not present

## 2016-11-25 DIAGNOSIS — M9903 Segmental and somatic dysfunction of lumbar region: Secondary | ICD-10-CM | POA: Diagnosis not present

## 2016-11-25 DIAGNOSIS — M9901 Segmental and somatic dysfunction of cervical region: Secondary | ICD-10-CM | POA: Diagnosis not present

## 2016-11-25 DIAGNOSIS — M5136 Other intervertebral disc degeneration, lumbar region: Secondary | ICD-10-CM | POA: Diagnosis not present

## 2016-11-25 DIAGNOSIS — M5411 Radiculopathy, occipito-atlanto-axial region: Secondary | ICD-10-CM | POA: Diagnosis not present

## 2016-11-28 ENCOUNTER — Encounter: Payer: Self-pay | Admitting: Internal Medicine

## 2016-11-29 ENCOUNTER — Other Ambulatory Visit: Payer: Self-pay | Admitting: Internal Medicine

## 2016-11-29 DIAGNOSIS — K602 Anal fissure, unspecified: Secondary | ICD-10-CM

## 2016-11-29 DIAGNOSIS — K50819 Crohn's disease of both small and large intestine with unspecified complications: Secondary | ICD-10-CM

## 2016-11-29 MED ORDER — HYDROCODONE-ACETAMINOPHEN 5-325 MG PO TABS: 1.0000 | ORAL_TABLET | Freq: Four times a day (QID) | ORAL | 0 refills | Status: DC | PRN

## 2016-11-29 NOTE — Telephone Encounter (Signed)
Okay to refill Sir, thank you for your time.

## 2016-11-30 NOTE — Telephone Encounter (Signed)
OK to refill x 4 mos

## 2016-12-02 DIAGNOSIS — M5136 Other intervertebral disc degeneration, lumbar region: Secondary | ICD-10-CM | POA: Diagnosis not present

## 2016-12-02 DIAGNOSIS — M9901 Segmental and somatic dysfunction of cervical region: Secondary | ICD-10-CM | POA: Diagnosis not present

## 2016-12-02 DIAGNOSIS — M9903 Segmental and somatic dysfunction of lumbar region: Secondary | ICD-10-CM | POA: Diagnosis not present

## 2016-12-02 DIAGNOSIS — M5411 Radiculopathy, occipito-atlanto-axial region: Secondary | ICD-10-CM | POA: Diagnosis not present

## 2016-12-06 ENCOUNTER — Other Ambulatory Visit (INDEPENDENT_AMBULATORY_CARE_PROVIDER_SITE_OTHER): Payer: Medicare Other

## 2016-12-06 DIAGNOSIS — K50813 Crohn's disease of both small and large intestine with fistula: Secondary | ICD-10-CM | POA: Diagnosis not present

## 2016-12-06 DIAGNOSIS — D500 Iron deficiency anemia secondary to blood loss (chronic): Secondary | ICD-10-CM | POA: Diagnosis not present

## 2016-12-06 DIAGNOSIS — K5090 Crohn's disease, unspecified, without complications: Secondary | ICD-10-CM | POA: Diagnosis not present

## 2016-12-06 LAB — CBC WITH DIFFERENTIAL/PLATELET
Basophils Absolute: 0 10*3/uL (ref 0.0–0.1)
Basophils Relative: 0.3 % (ref 0.0–3.0)
EOS ABS: 0.2 10*3/uL (ref 0.0–0.7)
EOS PCT: 1.2 % (ref 0.0–5.0)
HEMATOCRIT: 39.9 % (ref 39.0–52.0)
HEMOGLOBIN: 12.4 g/dL — AB (ref 13.0–17.0)
LYMPHS ABS: 1.9 10*3/uL (ref 0.7–4.0)
LYMPHS PCT: 14.7 % (ref 12.0–46.0)
MCHC: 31.1 g/dL (ref 30.0–36.0)
MCV: 78.2 fl (ref 78.0–100.0)
MONOS PCT: 9.5 % (ref 3.0–12.0)
Monocytes Absolute: 1.2 10*3/uL — ABNORMAL HIGH (ref 0.1–1.0)
Neutro Abs: 9.7 10*3/uL — ABNORMAL HIGH (ref 1.4–7.7)
Neutrophils Relative %: 74.3 % (ref 43.0–77.0)
PLATELETS: 293 10*3/uL (ref 150.0–400.0)
RBC: 5.1 Mil/uL (ref 4.22–5.81)
RDW: 29.2 % — AB (ref 11.5–15.5)
WBC: 13.1 10*3/uL — ABNORMAL HIGH (ref 4.0–10.5)

## 2016-12-06 LAB — FERRITIN: Ferritin: 34.6 ng/mL (ref 22.0–322.0)

## 2016-12-08 DIAGNOSIS — M5136 Other intervertebral disc degeneration, lumbar region: Secondary | ICD-10-CM | POA: Diagnosis not present

## 2016-12-08 DIAGNOSIS — M5411 Radiculopathy, occipito-atlanto-axial region: Secondary | ICD-10-CM | POA: Diagnosis not present

## 2016-12-08 DIAGNOSIS — M9903 Segmental and somatic dysfunction of lumbar region: Secondary | ICD-10-CM | POA: Diagnosis not present

## 2016-12-08 DIAGNOSIS — M9901 Segmental and somatic dysfunction of cervical region: Secondary | ICD-10-CM | POA: Diagnosis not present

## 2016-12-08 NOTE — Progress Notes (Signed)
Labs are better - ferritin (iron level) is normal but low normal.  My Chart message sent Will need feraheme transfusions again in November x 2 dx iron def anemia due to chronic blood loss  Please contact him about this also Needs appt me December

## 2016-12-09 ENCOUNTER — Encounter: Payer: Self-pay | Admitting: Internal Medicine

## 2016-12-14 ENCOUNTER — Other Ambulatory Visit: Payer: Self-pay

## 2016-12-14 MED ORDER — FERRIC CARBOXYMALTOSE 750 MG/15ML IV SOLN: INTRAVENOUS | 0 refills | Status: DC

## 2016-12-16 DIAGNOSIS — M9903 Segmental and somatic dysfunction of lumbar region: Secondary | ICD-10-CM | POA: Diagnosis not present

## 2016-12-16 DIAGNOSIS — M9901 Segmental and somatic dysfunction of cervical region: Secondary | ICD-10-CM | POA: Diagnosis not present

## 2016-12-16 DIAGNOSIS — M5136 Other intervertebral disc degeneration, lumbar region: Secondary | ICD-10-CM | POA: Diagnosis not present

## 2016-12-16 DIAGNOSIS — M5411 Radiculopathy, occipito-atlanto-axial region: Secondary | ICD-10-CM | POA: Diagnosis not present

## 2016-12-22 ENCOUNTER — Encounter: Payer: Self-pay | Admitting: Internal Medicine

## 2016-12-23 ENCOUNTER — Telehealth: Payer: Self-pay

## 2016-12-23 DIAGNOSIS — M9901 Segmental and somatic dysfunction of cervical region: Secondary | ICD-10-CM | POA: Diagnosis not present

## 2016-12-23 DIAGNOSIS — M5136 Other intervertebral disc degeneration, lumbar region: Secondary | ICD-10-CM | POA: Diagnosis not present

## 2016-12-23 DIAGNOSIS — K50819 Crohn's disease of both small and large intestine with unspecified complications: Secondary | ICD-10-CM

## 2016-12-23 DIAGNOSIS — M9903 Segmental and somatic dysfunction of lumbar region: Secondary | ICD-10-CM | POA: Diagnosis not present

## 2016-12-23 DIAGNOSIS — M5411 Radiculopathy, occipito-atlanto-axial region: Secondary | ICD-10-CM | POA: Diagnosis not present

## 2016-12-23 DIAGNOSIS — K602 Anal fissure, unspecified: Secondary | ICD-10-CM

## 2016-12-23 MED ORDER — HYDROCODONE-ACETAMINOPHEN 5-325 MG PO TABS: 1.0000 | ORAL_TABLET | Freq: Four times a day (QID) | ORAL | 0 refills | Status: DC | PRN

## 2016-12-23 NOTE — Telephone Encounter (Signed)
-----   Message from [name], MD sent at 12/23/2016 11:40 AM EDT ----- Regarding: refil ok Go ahead and print the Rx - I told him we would have it Friday  I will sign it either this PM or tomorrow

## 2016-12-23 NOTE — Telephone Encounter (Signed)
Vicodin printed and I will get it signed and put it up front for pick up.

## 2016-12-27 DIAGNOSIS — F411 Generalized anxiety disorder: Secondary | ICD-10-CM | POA: Diagnosis not present

## 2016-12-27 DIAGNOSIS — K611 Rectal abscess: Secondary | ICD-10-CM | POA: Diagnosis not present

## 2016-12-27 DIAGNOSIS — Z23 Encounter for immunization: Secondary | ICD-10-CM | POA: Diagnosis not present

## 2016-12-30 DIAGNOSIS — M9901 Segmental and somatic dysfunction of cervical region: Secondary | ICD-10-CM | POA: Diagnosis not present

## 2016-12-30 DIAGNOSIS — M5411 Radiculopathy, occipito-atlanto-axial region: Secondary | ICD-10-CM | POA: Diagnosis not present

## 2016-12-30 DIAGNOSIS — M5136 Other intervertebral disc degeneration, lumbar region: Secondary | ICD-10-CM | POA: Diagnosis not present

## 2016-12-30 DIAGNOSIS — M9903 Segmental and somatic dysfunction of lumbar region: Secondary | ICD-10-CM | POA: Diagnosis not present

## 2017-01-05 ENCOUNTER — Encounter: Payer: Self-pay | Admitting: Internal Medicine

## 2017-01-06 DIAGNOSIS — M9901 Segmental and somatic dysfunction of cervical region: Secondary | ICD-10-CM | POA: Diagnosis not present

## 2017-01-06 DIAGNOSIS — M5411 Radiculopathy, occipito-atlanto-axial region: Secondary | ICD-10-CM | POA: Diagnosis not present

## 2017-01-06 DIAGNOSIS — M9903 Segmental and somatic dysfunction of lumbar region: Secondary | ICD-10-CM | POA: Diagnosis not present

## 2017-01-06 DIAGNOSIS — M5136 Other intervertebral disc degeneration, lumbar region: Secondary | ICD-10-CM | POA: Diagnosis not present

## 2017-01-07 ENCOUNTER — Telehealth: Payer: Self-pay

## 2017-01-07 NOTE — Telephone Encounter (Signed)
Left message to call back and set up a January appointment, lots of availability currently.

## 2017-01-07 NOTE — Telephone Encounter (Signed)
-----   Message from [name], MD sent at 01/07/2017  2:31 PM EST ----- Regarding: appt Need to se him in Jan please

## 2017-01-11 NOTE — Telephone Encounter (Signed)
[name] called back and set up a January 18th appointment with Dr Carlean Purl.

## 2017-01-13 DIAGNOSIS — M5411 Radiculopathy, occipito-atlanto-axial region: Secondary | ICD-10-CM | POA: Diagnosis not present

## 2017-01-13 DIAGNOSIS — M9901 Segmental and somatic dysfunction of cervical region: Secondary | ICD-10-CM | POA: Diagnosis not present

## 2017-01-13 DIAGNOSIS — M9903 Segmental and somatic dysfunction of lumbar region: Secondary | ICD-10-CM | POA: Diagnosis not present

## 2017-01-13 DIAGNOSIS — M5136 Other intervertebral disc degeneration, lumbar region: Secondary | ICD-10-CM | POA: Diagnosis not present

## 2017-01-14 ENCOUNTER — Telehealth: Payer: Self-pay | Admitting: Internal Medicine

## 2017-01-14 NOTE — Telephone Encounter (Signed)
Dr Carlean Purl can you answer this question?

## 2017-01-14 NOTE — Telephone Encounter (Signed)
He is getting Injectafer (iron) and Entyvio  I do not know about compatability of these two being infused - they would need to check with the manufactureres or prescribing information

## 2017-01-14 NOTE — Telephone Encounter (Signed)
Left message on machine to call back  

## 2017-01-17 ENCOUNTER — Encounter: Payer: Self-pay | Admitting: Internal Medicine

## 2017-01-19 ENCOUNTER — Encounter: Payer: Self-pay | Admitting: Internal Medicine

## 2017-01-19 DIAGNOSIS — M9901 Segmental and somatic dysfunction of cervical region: Secondary | ICD-10-CM | POA: Diagnosis not present

## 2017-01-19 DIAGNOSIS — M5136 Other intervertebral disc degeneration, lumbar region: Secondary | ICD-10-CM | POA: Diagnosis not present

## 2017-01-19 DIAGNOSIS — M9903 Segmental and somatic dysfunction of lumbar region: Secondary | ICD-10-CM | POA: Diagnosis not present

## 2017-01-19 DIAGNOSIS — M5411 Radiculopathy, occipito-atlanto-axial region: Secondary | ICD-10-CM | POA: Diagnosis not present

## 2017-01-21 ENCOUNTER — Encounter: Payer: Self-pay | Admitting: Internal Medicine

## 2017-01-24 ENCOUNTER — Other Ambulatory Visit: Payer: Self-pay | Admitting: Internal Medicine

## 2017-01-24 DIAGNOSIS — K602 Anal fissure, unspecified: Secondary | ICD-10-CM

## 2017-01-24 DIAGNOSIS — K50819 Crohn's disease of both small and large intestine with unspecified complications: Secondary | ICD-10-CM

## 2017-01-24 MED ORDER — HYDROCODONE-ACETAMINOPHEN 5-325 MG PO TABS: 1.0000 | ORAL_TABLET | Freq: Four times a day (QID) | ORAL | 0 refills | Status: DC | PRN

## 2017-01-27 DIAGNOSIS — M9901 Segmental and somatic dysfunction of cervical region: Secondary | ICD-10-CM | POA: Diagnosis not present

## 2017-01-27 DIAGNOSIS — M5136 Other intervertebral disc degeneration, lumbar region: Secondary | ICD-10-CM | POA: Diagnosis not present

## 2017-01-27 DIAGNOSIS — M9903 Segmental and somatic dysfunction of lumbar region: Secondary | ICD-10-CM | POA: Diagnosis not present

## 2017-01-27 DIAGNOSIS — M5411 Radiculopathy, occipito-atlanto-axial region: Secondary | ICD-10-CM | POA: Diagnosis not present

## 2017-01-31 DIAGNOSIS — K5090 Crohn's disease, unspecified, without complications: Secondary | ICD-10-CM | POA: Diagnosis not present

## 2017-02-02 ENCOUNTER — Other Ambulatory Visit: Payer: Self-pay | Admitting: Internal Medicine

## 2017-02-02 ENCOUNTER — Encounter: Payer: Self-pay | Admitting: Internal Medicine

## 2017-02-02 NOTE — Telephone Encounter (Signed)
Ok to refill Sir?  He sent a MyChart message today in regards to the need for this.

## 2017-02-03 DIAGNOSIS — M9901 Segmental and somatic dysfunction of cervical region: Secondary | ICD-10-CM | POA: Diagnosis not present

## 2017-02-03 DIAGNOSIS — M5411 Radiculopathy, occipito-atlanto-axial region: Secondary | ICD-10-CM | POA: Diagnosis not present

## 2017-02-03 DIAGNOSIS — M9903 Segmental and somatic dysfunction of lumbar region: Secondary | ICD-10-CM | POA: Diagnosis not present

## 2017-02-03 DIAGNOSIS — M5136 Other intervertebral disc degeneration, lumbar region: Secondary | ICD-10-CM | POA: Diagnosis not present

## 2017-02-03 NOTE — Telephone Encounter (Signed)
Refill x 1  He needs to be on books for a next avail f/u if not on already

## 2017-02-03 NOTE — Telephone Encounter (Signed)
Refilled rx , [name] has appointment for January 18th 2019.

## 2017-02-09 DIAGNOSIS — D509 Iron deficiency anemia, unspecified: Secondary | ICD-10-CM | POA: Diagnosis not present

## 2017-02-09 DIAGNOSIS — Z79899 Other long term (current) drug therapy: Secondary | ICD-10-CM | POA: Diagnosis not present

## 2017-02-09 DIAGNOSIS — M2550 Pain in unspecified joint: Secondary | ICD-10-CM | POA: Diagnosis not present

## 2017-02-09 DIAGNOSIS — K50113 Crohn's disease of large intestine with fistula: Secondary | ICD-10-CM | POA: Diagnosis not present

## 2017-02-09 DIAGNOSIS — K5090 Crohn's disease, unspecified, without complications: Secondary | ICD-10-CM | POA: Diagnosis not present

## 2017-02-10 DIAGNOSIS — M9901 Segmental and somatic dysfunction of cervical region: Secondary | ICD-10-CM | POA: Diagnosis not present

## 2017-02-10 DIAGNOSIS — M9903 Segmental and somatic dysfunction of lumbar region: Secondary | ICD-10-CM | POA: Diagnosis not present

## 2017-02-10 DIAGNOSIS — M5136 Other intervertebral disc degeneration, lumbar region: Secondary | ICD-10-CM | POA: Diagnosis not present

## 2017-02-10 DIAGNOSIS — M5411 Radiculopathy, occipito-atlanto-axial region: Secondary | ICD-10-CM | POA: Diagnosis not present

## 2017-02-10 DIAGNOSIS — D509 Iron deficiency anemia, unspecified: Secondary | ICD-10-CM | POA: Diagnosis not present

## 2017-02-16 DIAGNOSIS — M5136 Other intervertebral disc degeneration, lumbar region: Secondary | ICD-10-CM | POA: Diagnosis not present

## 2017-02-16 DIAGNOSIS — M5411 Radiculopathy, occipito-atlanto-axial region: Secondary | ICD-10-CM | POA: Diagnosis not present

## 2017-02-16 DIAGNOSIS — M9903 Segmental and somatic dysfunction of lumbar region: Secondary | ICD-10-CM | POA: Diagnosis not present

## 2017-02-16 DIAGNOSIS — M9901 Segmental and somatic dysfunction of cervical region: Secondary | ICD-10-CM | POA: Diagnosis not present

## 2017-02-17 DIAGNOSIS — D509 Iron deficiency anemia, unspecified: Secondary | ICD-10-CM | POA: Diagnosis not present

## 2017-02-18 ENCOUNTER — Encounter: Payer: Self-pay | Admitting: Internal Medicine

## 2017-02-21 ENCOUNTER — Telehealth: Payer: Self-pay | Admitting: Internal Medicine

## 2017-02-21 ENCOUNTER — Other Ambulatory Visit: Payer: Self-pay | Admitting: Internal Medicine

## 2017-02-21 DIAGNOSIS — K602 Anal fissure, unspecified: Secondary | ICD-10-CM

## 2017-02-21 DIAGNOSIS — K50819 Crohn's disease of both small and large intestine with unspecified complications: Secondary | ICD-10-CM

## 2017-02-21 MED ORDER — HYDROCODONE-ACETAMINOPHEN 5-325 MG PO TABS: 1.0000 | ORAL_TABLET | Freq: Four times a day (QID) | ORAL | 0 refills | Status: DC | PRN

## 2017-02-21 NOTE — Telephone Encounter (Signed)
[facility] not [facility].  We will cancel the [facility] prescription and I have prescribed [facility] [place].

## 2017-02-24 DIAGNOSIS — M5136 Other intervertebral disc degeneration, lumbar region: Secondary | ICD-10-CM | POA: Diagnosis not present

## 2017-02-24 DIAGNOSIS — M5411 Radiculopathy, occipito-atlanto-axial region: Secondary | ICD-10-CM | POA: Diagnosis not present

## 2017-02-24 DIAGNOSIS — M9901 Segmental and somatic dysfunction of cervical region: Secondary | ICD-10-CM | POA: Diagnosis not present

## 2017-02-24 DIAGNOSIS — M9903 Segmental and somatic dysfunction of lumbar region: Secondary | ICD-10-CM | POA: Diagnosis not present

## 2017-02-27 ENCOUNTER — Other Ambulatory Visit: Payer: Self-pay | Admitting: Internal Medicine

## 2017-02-28 DIAGNOSIS — D224 Melanocytic nevi of scalp and neck: Secondary | ICD-10-CM | POA: Diagnosis not present

## 2017-02-28 DIAGNOSIS — D1801 Hemangioma of skin and subcutaneous tissue: Secondary | ICD-10-CM | POA: Diagnosis not present

## 2017-02-28 DIAGNOSIS — L565 Disseminated superficial actinic porokeratosis (DSAP): Secondary | ICD-10-CM | POA: Diagnosis not present

## 2017-02-28 DIAGNOSIS — D2261 Melanocytic nevi of right upper limb, including shoulder: Secondary | ICD-10-CM | POA: Diagnosis not present

## 2017-02-28 DIAGNOSIS — L821 Other seborrheic keratosis: Secondary | ICD-10-CM | POA: Diagnosis not present

## 2017-02-28 DIAGNOSIS — D485 Neoplasm of uncertain behavior of skin: Secondary | ICD-10-CM | POA: Diagnosis not present

## 2017-02-28 DIAGNOSIS — D2271 Melanocytic nevi of right lower limb, including hip: Secondary | ICD-10-CM | POA: Diagnosis not present

## 2017-02-28 DIAGNOSIS — D2262 Melanocytic nevi of left upper limb, including shoulder: Secondary | ICD-10-CM | POA: Diagnosis not present

## 2017-02-28 DIAGNOSIS — D692 Other nonthrombocytopenic purpura: Secondary | ICD-10-CM | POA: Diagnosis not present

## 2017-02-28 DIAGNOSIS — D225 Melanocytic nevi of trunk: Secondary | ICD-10-CM | POA: Diagnosis not present

## 2017-02-28 DIAGNOSIS — Z85828 Personal history of other malignant neoplasm of skin: Secondary | ICD-10-CM | POA: Diagnosis not present

## 2017-03-03 DIAGNOSIS — M5411 Radiculopathy, occipito-atlanto-axial region: Secondary | ICD-10-CM | POA: Diagnosis not present

## 2017-03-03 DIAGNOSIS — M9901 Segmental and somatic dysfunction of cervical region: Secondary | ICD-10-CM | POA: Diagnosis not present

## 2017-03-03 DIAGNOSIS — M5136 Other intervertebral disc degeneration, lumbar region: Secondary | ICD-10-CM | POA: Diagnosis not present

## 2017-03-03 DIAGNOSIS — M9903 Segmental and somatic dysfunction of lumbar region: Secondary | ICD-10-CM | POA: Diagnosis not present

## 2017-03-07 DIAGNOSIS — K50113 Crohn's disease of large intestine with fistula: Secondary | ICD-10-CM | POA: Diagnosis not present

## 2017-03-10 ENCOUNTER — Encounter: Payer: Self-pay | Admitting: Internal Medicine

## 2017-03-10 ENCOUNTER — Other Ambulatory Visit: Payer: Self-pay | Admitting: Internal Medicine

## 2017-03-10 DIAGNOSIS — K754 Autoimmune hepatitis: Secondary | ICD-10-CM

## 2017-03-10 DIAGNOSIS — M9903 Segmental and somatic dysfunction of lumbar region: Secondary | ICD-10-CM | POA: Diagnosis not present

## 2017-03-10 DIAGNOSIS — M5411 Radiculopathy, occipito-atlanto-axial region: Secondary | ICD-10-CM | POA: Diagnosis not present

## 2017-03-10 DIAGNOSIS — M9901 Segmental and somatic dysfunction of cervical region: Secondary | ICD-10-CM | POA: Diagnosis not present

## 2017-03-10 DIAGNOSIS — M5136 Other intervertebral disc degeneration, lumbar region: Secondary | ICD-10-CM | POA: Diagnosis not present

## 2017-03-10 DIAGNOSIS — D500 Iron deficiency anemia secondary to blood loss (chronic): Secondary | ICD-10-CM

## 2017-03-11 ENCOUNTER — Other Ambulatory Visit (INDEPENDENT_AMBULATORY_CARE_PROVIDER_SITE_OTHER): Payer: Medicare Other

## 2017-03-11 DIAGNOSIS — K754 Autoimmune hepatitis: Secondary | ICD-10-CM | POA: Diagnosis not present

## 2017-03-11 DIAGNOSIS — D500 Iron deficiency anemia secondary to blood loss (chronic): Secondary | ICD-10-CM

## 2017-03-11 LAB — COMPREHENSIVE METABOLIC PANEL
ALBUMIN: 3.2 g/dL — AB (ref 3.5–5.2)
ALK PHOS: 119 U/L — AB (ref 39–117)
ALT: 53 U/L (ref 0–53)
AST: 22 U/L (ref 0–37)
BUN: 16 mg/dL (ref 6–23)
CALCIUM: 7.8 mg/dL — AB (ref 8.4–10.5)
CO2: 29 mEq/L (ref 19–32)
CREATININE: 1.18 mg/dL (ref 0.40–1.50)
Chloride: 101 mEq/L (ref 96–112)
GFR: 68.41 mL/min (ref >60.00)
Glucose, Bld: 84 mg/dL (ref 70–99)
POTASSIUM: 3.2 meq/L — AB (ref 3.5–5.1)
Sodium: 138 mEq/L (ref 135–145)
TOTAL PROTEIN: 5.7 g/dL — AB (ref 6.0–8.3)
Total Bilirubin: 0.4 mg/dL (ref 0.2–1.2)

## 2017-03-11 LAB — CBC WITH DIFFERENTIAL/PLATELET
BASOS ABS: 0.1 10*3/uL (ref 0.0–0.1)
Basophils Relative: 0.5 % (ref 0.0–3.0)
Eosinophils Absolute: 0.1 10*3/uL (ref 0.0–0.7)
Eosinophils Relative: 1.1 % (ref 0.0–5.0)
HCT: 44.8 % (ref 39.0–52.0)
Hemoglobin: 14.2 g/dL (ref 13.0–17.0)
LYMPHS ABS: 2.1 10*3/uL (ref 0.7–4.0)
Lymphocytes Relative: 15.7 % (ref 12.0–46.0)
MCHC: 31.7 g/dL (ref 30.0–36.0)
MCV: 85.1 fl (ref 78.0–100.0)
MONO ABS: 1.7 10*3/uL — AB (ref 0.1–1.0)
MONOS PCT: 12.6 % — AB (ref 3.0–12.0)
Neutro Abs: 9.2 10*3/uL — ABNORMAL HIGH (ref 1.4–7.7)
Neutrophils Relative %: 70.1 % (ref 43.0–77.0)
Platelets: 259 10*3/uL (ref 150.0–400.0)
RBC: 5.27 Mil/uL (ref 4.22–5.81)
RDW: 24.8 % — ABNORMAL HIGH (ref 11.5–15.5)
WBC: 13.1 10*3/uL — ABNORMAL HIGH (ref 4.0–10.5)

## 2017-03-11 LAB — FERRITIN: FERRITIN: 359.6 ng/mL — AB (ref 22.0–322.0)

## 2017-03-14 NOTE — Progress Notes (Signed)
Labs look good overall Will discuss at f/u

## 2017-03-15 ENCOUNTER — Encounter: Payer: Self-pay | Admitting: Internal Medicine

## 2017-03-16 DIAGNOSIS — M5411 Radiculopathy, occipito-atlanto-axial region: Secondary | ICD-10-CM | POA: Diagnosis not present

## 2017-03-16 DIAGNOSIS — M9903 Segmental and somatic dysfunction of lumbar region: Secondary | ICD-10-CM | POA: Diagnosis not present

## 2017-03-16 DIAGNOSIS — M9901 Segmental and somatic dysfunction of cervical region: Secondary | ICD-10-CM | POA: Diagnosis not present

## 2017-03-16 DIAGNOSIS — M5136 Other intervertebral disc degeneration, lumbar region: Secondary | ICD-10-CM | POA: Diagnosis not present

## 2017-03-18 ENCOUNTER — Ambulatory Visit (INDEPENDENT_AMBULATORY_CARE_PROVIDER_SITE_OTHER): Payer: Medicare Other | Admitting: Internal Medicine

## 2017-03-18 ENCOUNTER — Encounter: Payer: Self-pay | Admitting: Internal Medicine

## 2017-03-18 VITALS — BP 120/84 | HR 80 | Ht 73.25 in | Wt 205.4 lb

## 2017-03-18 DIAGNOSIS — K50814 Crohn's disease of both small and large intestine with abscess: Secondary | ICD-10-CM | POA: Diagnosis not present

## 2017-03-18 DIAGNOSIS — K604 Rectal fistula: Secondary | ICD-10-CM

## 2017-03-18 DIAGNOSIS — Z7952 Long term (current) use of systemic steroids: Secondary | ICD-10-CM | POA: Diagnosis not present

## 2017-03-18 DIAGNOSIS — D500 Iron deficiency anemia secondary to blood loss (chronic): Secondary | ICD-10-CM | POA: Diagnosis not present

## 2017-03-18 DIAGNOSIS — K754 Autoimmune hepatitis: Secondary | ICD-10-CM | POA: Diagnosis not present

## 2017-03-18 DIAGNOSIS — Z86010 Personal history of colonic polyps: Secondary | ICD-10-CM | POA: Diagnosis not present

## 2017-03-18 NOTE — Assessment & Plan Note (Signed)
Try to get him to have a DEXA scan again this year

## 2017-03-18 NOTE — Patient Instructions (Signed)
You have been scheduled for a colonoscopy. Please follow written instructions given to you at your visit today.  Please pick up your prep supplies at the pharmacy. If you use inhalers (even only as needed), please bring them with you on the day of your procedure.   I appreciate the opportunity to care for you. Silvano Rusk, MD, [facility]

## 2017-03-18 NOTE — Progress Notes (Signed)
[name] 52 y.o. 1966-01-01 [id number]  Assessment & Plan:   Encounter Diagnoses  Name Primary?  . Crohn's disease of both small and large intestine with abscess ([place]) Yes  . Perirectal fistula   . Iron deficiency anemia due to chronic blood loss   . Autoimmune hepatitis-PSC overlap   . Long term (current) use of systemic steroids     Autoimmune hepatitis-PSC overlap Stable - mild increase alk phos - prednisone treating  CROHN'S DISEASE, LARGE AND SMALL INTESTINES Reassess with colonoscopy He has an abscess again related to the fistula it seems.  He is tolerating this.  Long term current use of systemic steroids Try to get him to have a DEXA scan again this year  Personal history of adenomatous colonic polyps Follow-up colonoscopy being arranged for March  Perirectal fistula Intermittent or persistent abscess formation here I think, scars from seton seton tract appears mostly open but there is a small abscess adjacent to  CC: [name] Cruel, MD Dr. [name]   Subjective:   Chief Complaint: Follow-up of Crohn's disease autoimmune liver disease deficiency anemia  HPI [name] presents today for follow-up of multiple illnesses including his Crohn's disease, chronic blood loss anemia, and autoimmune hepatitis cholangitis overlap.  He continues on Entyvio infusion, and he also had Injectafer infusion for iron and his ferritin is finally normal in fact is high at 300+.  Hemoglobin normal also.  He says he still has intermittent bloody mucus.  He still gets pus draining from his fistula/abscess in the perirectal area on the right buttock.  Still has pain there for which he takes hydrocodone usually about 1 a day.  Some diarrhea as well.  Continues with intermittent right upper quadrant pain.  Had a skin cancer removed from his neck and needs final or completion removal of that, he says it was on the left neck.  Taking 30 mg prednisone daily still.  Says he  cannot reduce.  Is willing but reluctant to have a colonoscopy.  Still sticking to a bland diet if he deviates from that his diarrhea and rectal bleeding will flare. Allergies  Allergen Reactions  . Asacol [Mesalamine] Diarrhea    abd pain  . Flagyl [Metronidazole] Diarrhea    abd pain  . Azathioprine Diarrhea    abd pain  . Gluten Meal Diarrhea  . Mycophenolate Mofetil Diarrhea    abd pain  . Other     NUTS; constipation  . Wheat Bran Diarrhea    Constipation; flatulence; abd pain  . Tape Itching and Rash    Please use "paper" tape   Current Meds  Medication Sig  . ascorbic acid (VITAMIN C) 1000 MG tablet Take 2,000 mg by mouth daily.   . Calcium Carb-Cholecalciferol (CALCIUM 1000 + D) 1000-800 MG-UNIT TABS Take by mouth. Takes 2 (2042m) tabs bid  . cholecalciferol (VITAMIN D) 1000 UNITS tablet Take 4,000 Units by mouth 2 (two) times daily.   . clomiPHENE (CLOMID) 50 MG tablet Take 25 mg by mouth every 3 (three) days.  . clonazePAM (KLONOPIN) 0.5 MG tablet Take 1 tablet (0.5 mg total) by mouth at bedtime as needed (insomnia).  .Marland Kitchendiltiazem 2 % GEL Apply a pea size amount into rectum 2 times a day  . ferric carboxymaltose (INJECTAFER) 750 MG/15ML SOLN injection Please give 750 mg IV and repeat in 7 days  . fluticasone (FLONASE) 50 MCG/ACT nasal spray Place 2 sprays into the nose every morning. Each nostril   . HYDROcodone-acetaminophen (  NORCO/VICODIN) 5-325 MG tablet Take 1-2 tablets by mouth every 6 (six) hours as needed for moderate pain.  . mirtazapine (REMERON) 30 MG tablet Take 30 mg by mouth at bedtime.  . predniSONE (DELTASONE) 20 MG tablet TAKE 1.25 TABLETS ONCE DAILY THEN USE AS DIRECTED BY MD  . PROCTOSOL HC 2.5 % rectal cream APPLY RECTALLY 2 TIMES DAILY AS NEEDED FOR HEMORRHOIDS OR ITCHING  . Vedolizumab (ENTYVIO IV) Inject into the vein. Every 2 months  . vitamin A 25000 UNIT capsule Take 25,000 Units by mouth daily.   Past Medical History:  Diagnosis Date  . Anal  fistula   . Anxiety   . 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 Autoimmune hepatitis ([place]) 01/19/2013  . Chronic mesenteric ischemia ([place])   . Chronic pain syndrome 07/09/2016  . Crohn's disease of small and large intestines ([place])    followed by dr Glendell Docker [place]  . Dairy product intolerance   . Diarrhea, functional   . Family history of adverse reaction to anesthesia   . Grover's disease    transient acantholytic dermatosis  . History of alcohol abuse   . History of basal cell carcinoma excision    2013 left leg  . History of Clostridium difficile    10/ 2014  . History of multiple concussions    x6   last one Jan 2017 per pt--  no residual  . History of substance abuse    quit 1997 per pt  . History of suicide attempt    05-18-2012  overdose /  failure to thrive  . Hypercholesterolemia   . Iron deficiency anemia due to chronic blood loss   . Major depression, recurrent, chronic ([place])   . Osteopenia   . Personal history of adenomatous colonic polyps 12/2010, 03/2012   12/2010 - 8 mm serrated adenoma of rectum  . Portal vein thrombosis 03/21/2015   right  . Primary sclerosing cholangitis    ? hepatitis overlap - liver bx x 2 and MRCP  . Seasonal allergies    Past Surgical History:  Procedure Laterality Date  . ABDOMINAL AORTAGRAM N/A 03/29/2012   Procedure: ABDOMINAL Maxcine Ham;  Surgeon: Serafina Mitchell, MD;  Location: [facility] CATH LAB;  Service: Cardiovascular;  Laterality: N/A;  . ADENOIDECTOMY  age 40  . CATARACT EXTRACTION W/ INTRAOCULAR LENS  IMPLANT, BILATERAL  2009  . COLONOSCOPY  2001, 05/02/2003, 01/28/11   2012: Right colon Crohn's, rectal polyp  . COLONOSCOPY  03/31/2012   Procedure: COLONOSCOPY;  Surgeon: Jerene Bears, MD;  Location: [facility];  Service: Gastroenterology;  Laterality: N/A;  . ESOPHAGOGASTRODUODENOSCOPY  01/28/11   Normal  . FOOT SURGERY Right age 25  . MOHS SURGERY Left 11/2013   left ankle parakerotosis   . PERCUTANEOUS LIVER BIOPSY  2007 and  2008  . PILONIDAL CYST EXCISION  age 67  . PLACEMENT OF SETON N/A 11/06/2015   Procedure: PLACEMENT OF SETON;  Surgeon: [name], MD;  Location: [facility];  Service: General;  Laterality: N/A;   Social History   Social History Narrative   Single, history of substance abuse in recovery   Previously occupied on medical disability   1 child    Elderly parents involved and he helps them   1 brother in [place]   family history includes Allergies in his father; Breast cancer in his mother; Clotting disorder in his father; Heart disease in his father; Stomach cancer in his mother; Thyroid cancer  in his father.   Review of Systems Per HPI  Objective:   Physical Exam BP 120/84 (BP Location: Left Arm, Patient Position: Sitting, Cuff Size: Normal)   Pulse 80   Ht 6' 1.25" (1.861 m)   Wt 205 lb 6 oz (93.2 kg)   BMI 26.91 kg/m  Skin on the left neck, shows a tiny residual papule I believe this is the skin cancer awaiting removal he has multiple nevi as well and other skin tags etc. on the body.  No acute rash. The eyes are anicteric Lungs clear to auscultation Heart sounds are normal The abdomen is soft and nontender without organomegaly or mass appreciated today  Inspection of the rectal area shows on the right posterior buttock area, a defect from prior seton and incision and drainage, and more anterior and slightly lateral to that is a small opening exuding pus and that whole area is somewhat indurated.  No significant erythema and mildly tender.  Digital exam is deferred until colonoscopy.  Affect and mood appear appropriate he is alert and oriented   Data reviewed see HPI, also his alkaline phosphatase is just mildly elevated his LFTs are otherwise normal.

## 2017-03-18 NOTE — Assessment & Plan Note (Signed)
Intermittent or persistent abscess formation here I think, scars from seton seton tract appears mostly open but there is a small abscess adjacent to

## 2017-03-18 NOTE — Assessment & Plan Note (Signed)
Stable - mild increase alk phos - prednisone treating

## 2017-03-18 NOTE — Assessment & Plan Note (Signed)
Follow-up colonoscopy being arranged for March

## 2017-03-18 NOTE — Assessment & Plan Note (Addendum)
Reassess with colonoscopy He has an abscess again related to the fistula it seems.  He is tolerating this.

## 2017-03-20 ENCOUNTER — Encounter: Payer: Self-pay | Admitting: Internal Medicine

## 2017-03-21 ENCOUNTER — Other Ambulatory Visit: Payer: Self-pay | Admitting: Internal Medicine

## 2017-03-21 DIAGNOSIS — K50819 Crohn's disease of both small and large intestine with unspecified complications: Secondary | ICD-10-CM

## 2017-03-21 DIAGNOSIS — K602 Anal fissure, unspecified: Secondary | ICD-10-CM

## 2017-03-21 MED ORDER — HYDROCODONE-ACETAMINOPHEN 5-325 MG PO TABS: 1.0000 | ORAL_TABLET | Freq: Four times a day (QID) | ORAL | 0 refills | Status: DC | PRN

## 2017-03-24 DIAGNOSIS — M9903 Segmental and somatic dysfunction of lumbar region: Secondary | ICD-10-CM | POA: Diagnosis not present

## 2017-03-24 DIAGNOSIS — M9901 Segmental and somatic dysfunction of cervical region: Secondary | ICD-10-CM | POA: Diagnosis not present

## 2017-03-24 DIAGNOSIS — M5411 Radiculopathy, occipito-atlanto-axial region: Secondary | ICD-10-CM | POA: Diagnosis not present

## 2017-03-24 DIAGNOSIS — M5136 Other intervertebral disc degeneration, lumbar region: Secondary | ICD-10-CM | POA: Diagnosis not present

## 2017-03-29 DIAGNOSIS — K5090 Crohn's disease, unspecified, without complications: Secondary | ICD-10-CM | POA: Diagnosis not present

## 2017-03-31 DIAGNOSIS — M5136 Other intervertebral disc degeneration, lumbar region: Secondary | ICD-10-CM | POA: Diagnosis not present

## 2017-03-31 DIAGNOSIS — M9903 Segmental and somatic dysfunction of lumbar region: Secondary | ICD-10-CM | POA: Diagnosis not present

## 2017-03-31 DIAGNOSIS — M9901 Segmental and somatic dysfunction of cervical region: Secondary | ICD-10-CM | POA: Diagnosis not present

## 2017-03-31 DIAGNOSIS — M5411 Radiculopathy, occipito-atlanto-axial region: Secondary | ICD-10-CM | POA: Diagnosis not present

## 2017-04-07 DIAGNOSIS — M9901 Segmental and somatic dysfunction of cervical region: Secondary | ICD-10-CM | POA: Diagnosis not present

## 2017-04-07 DIAGNOSIS — M9903 Segmental and somatic dysfunction of lumbar region: Secondary | ICD-10-CM | POA: Diagnosis not present

## 2017-04-07 DIAGNOSIS — M5411 Radiculopathy, occipito-atlanto-axial region: Secondary | ICD-10-CM | POA: Diagnosis not present

## 2017-04-07 DIAGNOSIS — M5136 Other intervertebral disc degeneration, lumbar region: Secondary | ICD-10-CM | POA: Diagnosis not present

## 2017-04-12 DIAGNOSIS — D485 Neoplasm of uncertain behavior of skin: Secondary | ICD-10-CM | POA: Diagnosis not present

## 2017-04-12 DIAGNOSIS — Z85828 Personal history of other malignant neoplasm of skin: Secondary | ICD-10-CM | POA: Diagnosis not present

## 2017-04-12 DIAGNOSIS — L988 Other specified disorders of the skin and subcutaneous tissue: Secondary | ICD-10-CM | POA: Diagnosis not present

## 2017-04-14 ENCOUNTER — Encounter: Payer: Self-pay | Admitting: Internal Medicine

## 2017-04-15 ENCOUNTER — Other Ambulatory Visit: Payer: Self-pay | Admitting: Internal Medicine

## 2017-04-15 DIAGNOSIS — K602 Anal fissure, unspecified: Secondary | ICD-10-CM

## 2017-04-15 DIAGNOSIS — K50819 Crohn's disease of both small and large intestine with unspecified complications: Secondary | ICD-10-CM

## 2017-04-15 MED ORDER — HYDROCODONE-ACETAMINOPHEN 5-325 MG PO TABS: 1.0000 | ORAL_TABLET | Freq: Four times a day (QID) | ORAL | 0 refills | Status: DC | PRN

## 2017-04-26 DIAGNOSIS — M5136 Other intervertebral disc degeneration, lumbar region: Secondary | ICD-10-CM | POA: Diagnosis not present

## 2017-04-26 DIAGNOSIS — M9901 Segmental and somatic dysfunction of cervical region: Secondary | ICD-10-CM | POA: Diagnosis not present

## 2017-04-26 DIAGNOSIS — M9903 Segmental and somatic dysfunction of lumbar region: Secondary | ICD-10-CM | POA: Diagnosis not present

## 2017-04-26 DIAGNOSIS — M5411 Radiculopathy, occipito-atlanto-axial region: Secondary | ICD-10-CM | POA: Diagnosis not present

## 2017-05-05 DIAGNOSIS — M5136 Other intervertebral disc degeneration, lumbar region: Secondary | ICD-10-CM | POA: Diagnosis not present

## 2017-05-05 DIAGNOSIS — M9903 Segmental and somatic dysfunction of lumbar region: Secondary | ICD-10-CM | POA: Diagnosis not present

## 2017-05-05 DIAGNOSIS — M5411 Radiculopathy, occipito-atlanto-axial region: Secondary | ICD-10-CM | POA: Diagnosis not present

## 2017-05-05 DIAGNOSIS — M9901 Segmental and somatic dysfunction of cervical region: Secondary | ICD-10-CM | POA: Diagnosis not present

## 2017-05-09 ENCOUNTER — Encounter: Payer: Self-pay | Admitting: Internal Medicine

## 2017-05-12 ENCOUNTER — Other Ambulatory Visit: Payer: Self-pay | Admitting: Internal Medicine

## 2017-05-12 DIAGNOSIS — K50819 Crohn's disease of both small and large intestine with unspecified complications: Secondary | ICD-10-CM

## 2017-05-12 DIAGNOSIS — K602 Anal fissure, unspecified: Secondary | ICD-10-CM

## 2017-05-12 MED ORDER — HYDROCODONE-ACETAMINOPHEN 5-325 MG PO TABS: 1.0000 | ORAL_TABLET | Freq: Four times a day (QID) | ORAL | 0 refills | Status: DC | PRN

## 2017-05-19 ENCOUNTER — Ambulatory Visit (AMBULATORY_SURGERY_CENTER): Payer: Medicare Other | Admitting: Internal Medicine

## 2017-05-19 ENCOUNTER — Other Ambulatory Visit: Payer: Self-pay

## 2017-05-19 ENCOUNTER — Encounter: Payer: Self-pay | Admitting: Internal Medicine

## 2017-05-19 VITALS — BP 113/70 | HR 65 | Temp 97.8°F | Resp 15 | Ht 73.25 in | Wt 205.0 lb

## 2017-05-19 DIAGNOSIS — D122 Benign neoplasm of ascending colon: Secondary | ICD-10-CM

## 2017-05-19 DIAGNOSIS — K635 Polyp of colon: Secondary | ICD-10-CM

## 2017-05-19 DIAGNOSIS — K6389 Other specified diseases of intestine: Secondary | ICD-10-CM

## 2017-05-19 DIAGNOSIS — K5090 Crohn's disease, unspecified, without complications: Secondary | ICD-10-CM | POA: Diagnosis not present

## 2017-05-19 DIAGNOSIS — K5140 Inflammatory polyps of colon without complications: Secondary | ICD-10-CM | POA: Diagnosis not present

## 2017-05-19 DIAGNOSIS — D123 Benign neoplasm of transverse colon: Secondary | ICD-10-CM

## 2017-05-19 DIAGNOSIS — I10 Essential (primary) hypertension: Secondary | ICD-10-CM | POA: Diagnosis not present

## 2017-05-19 DIAGNOSIS — D126 Benign neoplasm of colon, unspecified: Secondary | ICD-10-CM

## 2017-05-19 DIAGNOSIS — D120 Benign neoplasm of cecum: Secondary | ICD-10-CM

## 2017-05-19 DIAGNOSIS — K50814 Crohn's disease of both small and large intestine with abscess: Secondary | ICD-10-CM

## 2017-05-19 MED ORDER — SODIUM CHLORIDE 0.9 % IV SOLN: 500.0000 mL | Freq: Once | INTRAVENOUS | Status: DC

## 2017-05-19 NOTE — Progress Notes (Signed)
No problems noted in the recovery room. maw 

## 2017-05-19 NOTE — Op Note (Addendum)
[facility] Patient Name: [name] Procedure Date: 05/19/2017 10:08 AM MRN: [id number] Endoscopist: [name] , MD Age: 52 Referring MD:  Date of Birth: Nov 03, 1965 Gender: Male Account #: [id number] Procedure:                Colonoscopy Indications:              Crohn's disease of the small bowel and colon,                            Follow-up of Crohn's disease of the small bowel and                            colon, Disease activity assessment of Crohn's                            disease of the small bowel and colon Medicines:                Propofol per Anesthesia, Monitored Anesthesia Care Procedure:                Pre-Anesthesia Assessment:                           - Prior to the procedure, a History and Physical                            was performed, and patient medications and                            allergies were reviewed. The patient's tolerance of                            previous anesthesia was also reviewed. The risks                            and benefits of the procedure and the sedation                            options and risks were discussed with the patient.                            All questions were answered, and informed consent                            was obtained. Prior Anticoagulants: The patient has                            taken no previous anticoagulant or antiplatelet                            agents. ASA Grade Assessment: III - A patient with                            severe systemic disease. After reviewing the risks  and benefits, the patient was deemed in                            satisfactory condition to undergo the procedure.                           After obtaining informed consent, the colonoscope                            was passed under direct vision. Throughout the                            procedure, the patient's blood pressure, pulse, and   oxygen saturations were monitored continuously. The                            Colonoscope was introduced through the anus and                            advanced to the the terminal ileum, with                            identification of the appendiceal orifice and IC                            valve. The colonoscopy was performed without                            difficulty. The patient tolerated the procedure                            well. The quality of the bowel preparation was                            good. The bowel preparation used was Miralax. The                            terminal ileum, ileocecal valve, appendiceal                            orifice, and rectum were photographed. Scope In: 10:29:41 AM Scope Out: 11:10:13 AM Scope Withdrawal Time: 0 hours 36 minutes 56 seconds  Total Procedure Duration: 0 hours 40 minutes 32 seconds  Findings:                 The perianal and digital rectal examinations were                            normal. Pertinent negatives include normal prostate                            (size, shape, and consistency).                           The terminal ileum contained one semi-sessile,  non-bleeding polyp. The polyp was 6 mm in diameter.                            The polyp was removed with a cold snare. Resection                            and retrieval were complete. Verification of                            patient identification for the specimen was done.                            Estimated blood loss was minimal.                           A 12 mm polyp was found in the ascending colon. The                            polyp was pedunculated. The polyp was removed with                            a hot snare. Resection and retrieval were complete.                            Verification of patient identification for the                            specimen was done. Estimated blood loss: none.                            A large polyp was found in the transverse colon.                            The polyp was pedunculated. The polyp was removed                            with a hot snare. Resection and retrieval were                            complete. Verification of patient identification                            for the specimen was done. Estimated blood loss was                            minimal. To prevent bleeding after the polypectomy,                            one hemostatic clip was successfully placed (MR                            conditional). Area was successfully injected with 4  mL of a 1:10,000 solution of epinephrine for                            hemostasis. Estimated blood loss: none. Area was                            tattooed with an injection of 4 mL of Spot (carbon                            black).                           Many pedunculated and semi-sessile and                            semi-pedunculated polyps were found in the sigmoid                            colon, descending colon, transverse colon and                            ascending colon. The polyps were varied in size.                            Biopsies were taken with a cold forceps for                            histology. Verification of patient identification                            for the specimen was done. Estimated blood loss was                            minimal.                           The exam was otherwise without abnormality on                            direct and retroflexion views. Complications:            No immediate complications. Estimated Blood Loss:     Estimated blood loss was minimal. Impression:               - One ileal polyp in the terminal ileum, removed                            with a cold snare. Resected and retrieved.                           - One 12 mm polyp in the ascending colon, removed                            with a hot snare.  Resected and retrieved.                           -  One large polyp in the transverse colon, removed                            with a hot snare. Resected and retrieved. Clip (MR                            conditional) was placed. Injected. Tattooed.                           - Many varied polyps in the sigmoid colon, in the                            descending colon, in the transverse colon and in                            the ascending colon. Biopsied.                           - The examination was otherwise normal on direct                            and retroflexion views.                           Numerous TNTC polyps throughout colon - 2 removed -                            the very large one was 3 cm + perhaps attached by a                            stalk which was clipped and injected - this one                            might be neoplastic all others I saw appeared                            inflammatory but so many cannot really be sure - #                            of polyps >50 precludes removal or biopsy of all                           he also has small subcutaneous nodule on right                            posterior buttock - nothing expressed Recommendation:           - Patient has a contact number available for                            emergencies. The signs and symptoms of potential  delayed complications were discussed with the                            patient. Return to normal activities tomorrow.                            Written discharge instructions were provided to the                            patient.                           - No aspirin, ibuprofen, naproxen, or other                            non-steroidal anti-inflammatory drugs for 2 weeks                            after polyp removal.                           - Await pathology results.                           - Repeat colonoscopy is recommended. The                             colonoscopy date will be determined after pathology                            results from today's exam become available for                            review.                           - Resume previous diet. [name], MD 05/19/2017 11:24:35 AM This report has been signed electronically.

## 2017-05-19 NOTE — Patient Instructions (Addendum)
There were numerous polyps seen - most appear to be inflammatory in nature  Some removed Some biopsied Too many to remove all  I will let you know results and plans  I appreciate the opportunity to care for you. [name], MD, [facility]  Discharge instructions given. Handout on polyps. Card given for clip place during procedure. No ibuprofen, naproxen, or other NSAIDs for two weeks. Resume previous medications. YOU HAD AN ENDOSCOPIC PROCEDURE TODAY AT [facility]:   Refer to the procedure report that was given to you for any specific questions about what was found during the examination.  If the procedure report does not answer your questions, please call your gastroenterologist to clarify.  If you requested that your care partner not be given the details of your procedure findings, then the procedure report has been included in a sealed envelope for you to review at your convenience later.  YOU SHOULD EXPECT: Some feelings of bloating in the abdomen. Passage of more gas than usual.  Walking can help get rid of the air that was put into your GI tract during the procedure and reduce the bloating. If you had a lower endoscopy (such as a colonoscopy or flexible sigmoidoscopy) you may notice spotting of blood in your stool or on the toilet paper. If you underwent a bowel prep for your procedure, you may not have a normal bowel movement for a few days.  Please Note:  You might notice some irritation and congestion in your nose or some drainage.  This is from the oxygen used during your procedure.  There is no need for concern and it should clear up in a day or so.  SYMPTOMS TO REPORT IMMEDIATELY:   Following lower endoscopy (colonoscopy or flexible sigmoidoscopy):  Excessive amounts of blood in the stool  Significant tenderness or worsening of abdominal pains  Swelling of the abdomen that is new, acute  Fever of 100F or higher   For urgent or emergent issues, a  gastroenterologist can be reached at any hour by calling [phone].   DIET:  We do recommend a small meal at first, but then you may proceed to your regular diet.  Drink plenty of fluids but you should avoid alcoholic beverages for 24 hours.  ACTIVITY:  You should plan to take it easy for the rest of today and you should NOT DRIVE or use heavy machinery until tomorrow (because of the sedation medicines used during the test).    FOLLOW UP: Our staff will call the number listed on your records the next business day following your procedure to check on you and address any questions or concerns that you may have regarding the information given to you following your procedure. If we do not reach you, we will leave a message.  However, if you are feeling well and you are not experiencing any problems, there is no need to return our call.  We will assume that you have returned to your regular daily activities without incident.  If any biopsies were taken you will be contacted by phone or by letter within the next 1-3 weeks.  Please call us at [phone] if you have not heard about the biopsies in 3 weeks.    SIGNATURES/CONFIDENTIALITY: You and/or your care partner have signed paperwork which will be entered into your electronic medical record.  These signatures attest to the fact that that the information above on your After Visit Summary has been reviewed and is  understood.  Full responsibility of the confidentiality of this discharge information lies with you and/or your care-partner.

## 2017-05-19 NOTE — Progress Notes (Signed)
Called to room to assist during endoscopic procedure.  Patient ID and intended procedure confirmed with present staff. Received instructions for my participation in the procedure from the performing physician.  

## 2017-05-20 ENCOUNTER — Telehealth: Payer: Self-pay | Admitting: *Deleted

## 2017-05-20 NOTE — Telephone Encounter (Signed)
  Follow up Call-  Call back number 05/19/2017  Post procedure Call Back phone  # [phone]  Permission to leave phone message Yes  Some recent data might be hidden     Patient questions:  Message left to call us if necessary.

## 2017-05-20 NOTE — Telephone Encounter (Signed)
  Follow up Call-  Call back number 05/19/2017  Post procedure Call Back phone  # [phone]  Permission to leave phone message Yes  Some recent data might be hidden     Patient questions:  Message left to call us if necessary.  Second call.

## 2017-05-24 DIAGNOSIS — K5090 Crohn's disease, unspecified, without complications: Secondary | ICD-10-CM | POA: Diagnosis not present

## 2017-05-26 ENCOUNTER — Encounter: Payer: Self-pay | Admitting: Internal Medicine

## 2017-05-26 ENCOUNTER — Other Ambulatory Visit: Payer: Self-pay | Admitting: Internal Medicine

## 2017-05-26 DIAGNOSIS — K754 Autoimmune hepatitis: Secondary | ICD-10-CM

## 2017-05-26 DIAGNOSIS — D500 Iron deficiency anemia secondary to blood loss (chronic): Secondary | ICD-10-CM

## 2017-05-26 NOTE — Progress Notes (Signed)
Inflammatory pseudopolyps Recall colon 1 year My Chart letter

## 2017-06-08 ENCOUNTER — Encounter: Payer: Self-pay | Admitting: Internal Medicine

## 2017-06-10 ENCOUNTER — Other Ambulatory Visit: Payer: Self-pay | Admitting: Internal Medicine

## 2017-06-10 DIAGNOSIS — K50819 Crohn's disease of both small and large intestine with unspecified complications: Secondary | ICD-10-CM

## 2017-06-10 DIAGNOSIS — K602 Anal fissure, unspecified: Secondary | ICD-10-CM

## 2017-06-10 MED ORDER — HYDROCODONE-ACETAMINOPHEN 5-325 MG PO TABS: 1.0000 | ORAL_TABLET | Freq: Four times a day (QID) | ORAL | 0 refills | Status: DC | PRN

## 2017-06-16 ENCOUNTER — Telehealth: Payer: Self-pay | Admitting: Internal Medicine

## 2017-06-16 MED ORDER — PREDNISONE 20 MG PO TABS: ORAL_TABLET | ORAL | 1 refills | Status: DC

## 2017-06-16 NOTE — Telephone Encounter (Signed)
Will this be okay Sir?

## 2017-06-16 NOTE — Telephone Encounter (Signed)
Pharmacy states patient brought back medication prednisone due to the fact that it wasn't the orange pills and since they do not have the orange ones pharmacy wants to know if we can send new prescription of medication to [facility] in [place].

## 2017-06-16 NOTE — Telephone Encounter (Signed)
Prednisone resent and [name] informed. I confirmed with him that he is on the 96m daily dose.

## 2017-06-16 NOTE — Telephone Encounter (Signed)
OK  Prednisone 20 mg tabs   Take as directed   # 100 1 RF  I think he is currently on 30 mg

## 2017-06-29 DIAGNOSIS — F411 Generalized anxiety disorder: Secondary | ICD-10-CM | POA: Diagnosis not present

## 2017-06-29 DIAGNOSIS — Z0000 Encounter for general adult medical examination without abnormal findings: Secondary | ICD-10-CM | POA: Diagnosis not present

## 2017-06-29 DIAGNOSIS — E291 Testicular hypofunction: Secondary | ICD-10-CM | POA: Diagnosis not present

## 2017-06-29 DIAGNOSIS — F322 Major depressive disorder, single episode, severe without psychotic features: Secondary | ICD-10-CM | POA: Diagnosis not present

## 2017-07-06 ENCOUNTER — Encounter: Payer: Self-pay | Admitting: Internal Medicine

## 2017-07-07 ENCOUNTER — Other Ambulatory Visit: Payer: Self-pay | Admitting: Internal Medicine

## 2017-07-08 ENCOUNTER — Other Ambulatory Visit: Payer: Self-pay | Admitting: Internal Medicine

## 2017-07-08 ENCOUNTER — Encounter (INDEPENDENT_AMBULATORY_CARE_PROVIDER_SITE_OTHER): Payer: Self-pay

## 2017-07-08 DIAGNOSIS — K602 Anal fissure, unspecified: Secondary | ICD-10-CM

## 2017-07-08 DIAGNOSIS — K50819 Crohn's disease of both small and large intestine with unspecified complications: Secondary | ICD-10-CM

## 2017-07-08 MED ORDER — HYDROCODONE-ACETAMINOPHEN 5-325 MG PO TABS: 1.0000 | ORAL_TABLET | Freq: Four times a day (QID) | ORAL | 0 refills | Status: DC | PRN

## 2017-07-12 ENCOUNTER — Other Ambulatory Visit: Payer: Self-pay

## 2017-07-12 DIAGNOSIS — K50819 Crohn's disease of both small and large intestine with unspecified complications: Secondary | ICD-10-CM

## 2017-07-12 NOTE — Progress Notes (Signed)
[id number]

## 2017-07-15 ENCOUNTER — Other Ambulatory Visit: Payer: Self-pay

## 2017-07-15 ENCOUNTER — Encounter: Payer: Self-pay | Admitting: Internal Medicine

## 2017-07-15 DIAGNOSIS — K50819 Crohn's disease of both small and large intestine with unspecified complications: Secondary | ICD-10-CM

## 2017-07-19 DIAGNOSIS — K5090 Crohn's disease, unspecified, without complications: Secondary | ICD-10-CM | POA: Diagnosis not present

## 2017-07-20 ENCOUNTER — Encounter: Payer: Self-pay | Admitting: Internal Medicine

## 2017-07-21 ENCOUNTER — Encounter: Payer: Self-pay | Admitting: Internal Medicine

## 2017-07-22 DIAGNOSIS — I10 Essential (primary) hypertension: Secondary | ICD-10-CM | POA: Diagnosis not present

## 2017-07-24 LAB — HYDROCODONE + METABOLITES, U
HYDROMORPHONE UR-MCNC: 31 ng/mL
Hydrocodone Ur-Mcnc: 229 ng/mL

## 2017-07-26 ENCOUNTER — Encounter: Payer: Self-pay | Admitting: Internal Medicine

## 2017-07-26 NOTE — Progress Notes (Signed)
Not to patient by My Chart

## 2017-07-27 ENCOUNTER — Encounter: Payer: Self-pay | Admitting: Internal Medicine

## 2017-08-01 ENCOUNTER — Encounter: Payer: Self-pay | Admitting: Internal Medicine

## 2017-08-03 ENCOUNTER — Other Ambulatory Visit: Payer: Self-pay | Admitting: Internal Medicine

## 2017-08-03 ENCOUNTER — Encounter: Payer: Self-pay | Admitting: Internal Medicine

## 2017-08-03 DIAGNOSIS — K50818 Crohn's disease of both small and large intestine with other complication: Secondary | ICD-10-CM

## 2017-08-03 DIAGNOSIS — D500 Iron deficiency anemia secondary to blood loss (chronic): Secondary | ICD-10-CM

## 2017-08-03 DIAGNOSIS — R5383 Other fatigue: Secondary | ICD-10-CM

## 2017-08-04 ENCOUNTER — Other Ambulatory Visit (INDEPENDENT_AMBULATORY_CARE_PROVIDER_SITE_OTHER): Payer: Medicare Other

## 2017-08-04 DIAGNOSIS — K50818 Crohn's disease of both small and large intestine with other complication: Secondary | ICD-10-CM

## 2017-08-04 DIAGNOSIS — D500 Iron deficiency anemia secondary to blood loss (chronic): Secondary | ICD-10-CM

## 2017-08-04 DIAGNOSIS — R5383 Other fatigue: Secondary | ICD-10-CM | POA: Diagnosis not present

## 2017-08-04 LAB — TSH: TSH: 0.75 u[IU]/mL (ref 0.35–4.50)

## 2017-08-04 LAB — VITAMIN B12: Vitamin B-12: 524 pg/mL (ref 211–911)

## 2017-08-04 LAB — FERRITIN: Ferritin: 37.1 ng/mL (ref 22.0–322.0)

## 2017-08-05 ENCOUNTER — Other Ambulatory Visit: Payer: Self-pay

## 2017-08-05 ENCOUNTER — Encounter: Payer: Self-pay | Admitting: Internal Medicine

## 2017-08-05 DIAGNOSIS — D500 Iron deficiency anemia secondary to blood loss (chronic): Secondary | ICD-10-CM

## 2017-08-05 NOTE — Progress Notes (Signed)
Ferritin low NL B12 ok Thyroid ok He needs injectafer again and wants to do at hospital please reorder as written on the med list Let him know

## 2017-08-07 ENCOUNTER — Encounter: Payer: Self-pay | Admitting: Internal Medicine

## 2017-08-08 ENCOUNTER — Ambulatory Visit (INDEPENDENT_AMBULATORY_CARE_PROVIDER_SITE_OTHER): Payer: Medicare Other | Admitting: Internal Medicine

## 2017-08-08 ENCOUNTER — Encounter: Payer: Self-pay | Admitting: Internal Medicine

## 2017-08-08 VITALS — BP 140/84 | HR 100 | Ht 73.25 in | Wt 209.6 lb

## 2017-08-08 DIAGNOSIS — Z7960 Long term (current) use of unspecified immunomodulators and immunosuppressants: Secondary | ICD-10-CM

## 2017-08-08 DIAGNOSIS — K754 Autoimmune hepatitis: Secondary | ICD-10-CM | POA: Diagnosis not present

## 2017-08-08 DIAGNOSIS — K50818 Crohn's disease of both small and large intestine with other complication: Secondary | ICD-10-CM

## 2017-08-08 DIAGNOSIS — K602 Anal fissure, unspecified: Secondary | ICD-10-CM | POA: Diagnosis not present

## 2017-08-08 DIAGNOSIS — D500 Iron deficiency anemia secondary to blood loss (chronic): Secondary | ICD-10-CM | POA: Diagnosis not present

## 2017-08-08 DIAGNOSIS — Z79899 Other long term (current) drug therapy: Secondary | ICD-10-CM | POA: Diagnosis not present

## 2017-08-08 DIAGNOSIS — G894 Chronic pain syndrome: Secondary | ICD-10-CM | POA: Diagnosis not present

## 2017-08-08 MED ORDER — HYDROCODONE-ACETAMINOPHEN 5-325 MG PO TABS: 1.0000 | ORAL_TABLET | Freq: Four times a day (QID) | ORAL | 0 refills | Status: DC | PRN

## 2017-08-08 NOTE — Assessment & Plan Note (Signed)
Refill hydrocodone Urine screen was appropriately + last month

## 2017-08-08 NOTE — Assessment & Plan Note (Signed)
No problems 

## 2017-08-08 NOTE — Assessment & Plan Note (Signed)
Symptomatically improved on Entyvio Still has open area right perianal but not draining on exam and no abscess Stay on [facility] RTC Sept 2019

## 2017-08-08 NOTE — Progress Notes (Signed)
[name] 52 y.o. Mar 23, 1965 [id number]  Assessment & Plan:  CROHN'S DISEASE, LARGE AND SMALL INTESTINES Symptomatically improved on Entyvio Still has open area right perianal but not draining on exam and no abscess Stay on Entyvio RTC Sept 2019  Chronic pain syndrome Refill hydrocodone Urine screen was appropriately + last month  Autoimmune hepatitis-PSC overlap Biochemical remission  Anemia due to chronic blood loss Repeat injectafer  Long-term use of immunosuppressant medication - Entyvio No problems      Subjective:   Chief Complaint:  HPI [name] is here doing reasonably well.  He relates that the type of generic prednisone he was taking was changed and that upset him and made him very anxious in the last month but he seems to be getting over that.  His bowel habits are relatively formed.  Not much if any abdominal pain though he still has pain in the rectal area and uses his hydrocodone often in the evening to try to sleep better.  He has been faithful and compliant with that and his hydrocodone did show up in the urine screen as expected, last month.  No drainage from the perianal area no signs of an abscess he thinks.  He is pleased with the Entyvio.  He has felt a bit fatigued his ferritin was low normal almost low and he is due for injectafer infusions soon. Allergies  Allergen Reactions  . Asacol [Mesalamine] Diarrhea    abd pain  . Flagyl [Metronidazole] Diarrhea    abd pain  . Azathioprine Diarrhea    abd pain  . Gluten Meal Diarrhea  . Mycophenolate Mofetil Diarrhea    abd pain  . Other     NUTS; constipation  . Wheat Bran Diarrhea    Constipation; flatulence; abd pain  . Tape Itching and Rash    Please use "paper" tape   Current Meds  Medication Sig  . ascorbic acid (VITAMIN C) 1000 MG tablet Take 2,000 mg by mouth daily.   . Calcium Carb-Cholecalciferol (CALCIUM 1000 + D) 1000-800 MG-UNIT TABS Take by mouth. Takes 2 (2030m) tabs  bid  . cholecalciferol (VITAMIN D) 1000 UNITS tablet Take 4,000 Units by mouth 2 (two) times daily.   . clomiPHENE (CLOMID) 50 MG tablet Take 25 mg by mouth every 3 (three) days.  . clonazePAM (KLONOPIN) 0.5 MG tablet Take 1 tablet (0.5 mg total) by mouth at bedtime as needed (insomnia).  .Marland Kitchendiltiazem 2 % GEL Apply a pea size amount into rectum 2 times a day  . ferric carboxymaltose (INJECTAFER) 750 MG/15ML SOLN injection Please give 750 mg IV and repeat in 7 days  . fluticasone (FLONASE) 50 MCG/ACT nasal spray Place 2 sprays into the nose every morning. Each nostril   . HYDROcodone-acetaminophen (NORCO/VICODIN) 5-325 MG tablet Take 1-2 tablets by mouth every 6 (six) hours as needed for moderate pain.  . mirtazapine (REMERON) 30 MG tablet Take 30 mg by mouth at bedtime.  . predniSONE (DELTASONE) 20 MG tablet TAKE 1.5 TABLETS ONCE DAILY AS DIRECTED BY MD  . PROCTOSOL HC 2.5 % rectal cream APPLY RECTALLY 2 TIMES DAILY AS NEEDED FOR HEMORRHOIDS OR ITCHING  . Vedolizumab (ENTYVIO IV) Inject into the vein. Every 2 months  . vitamin A 25000 UNIT capsule Take 25,000 Units by mouth daily.  . [DISCONTINUED] HYDROcodone-acetaminophen (NORCO/VICODIN) 5-325 MG tablet Take 1-2 tablets by mouth every 6 (six) hours as needed for moderate pain.   Past Medical History:  Diagnosis Date  . Allergy   .  Anal fistula   . Anxiety   . 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 Autoimmune hepatitis ([place]) 01/19/2013  . Cancer of skin of neck   . Cataract   . Chronic mesenteric ischemia ([place])   . Chronic pain syndrome 07/09/2016  . Crohn's disease of small and large intestines ([place])    followed by dr Glendell Docker Eunique Balik  . Dairy product intolerance   . Diarrhea, functional   . Family history of adverse reaction to anesthesia   . Grover's disease    transient acantholytic dermatosis  . History of alcohol abuse   . History of basal cell carcinoma excision    2013 left leg  . History of Clostridium difficile    10/  2014  . History of multiple concussions    x6   last one Jan 2017 per pt--  no residual  . History of substance abuse    quit 1997 per pt  . History of suicide attempt    05-18-2012  overdose /  failure to thrive  . Hypercholesterolemia   . Iron deficiency anemia due to chronic blood loss   . Major depression, recurrent, chronic ([place])   . Osteopenia   . Personal history of adenomatous colonic polyps 12/2010, 03/2012   12/2010 - 8 mm serrated adenoma of rectum  . Portal vein thrombosis 03/21/2015   right  . Primary sclerosing cholangitis    ? hepatitis overlap - liver bx x 2 and MRCP  . Seasonal allergies   . Substance abuse ([place]) 1997   Alcohol   Past Surgical History:  Procedure Laterality Date  . ABDOMINAL AORTAGRAM N/A 03/29/2012   Procedure: ABDOMINAL Maxcine Ham;  Surgeon: Serafina Mitchell, MD;  Location: [facility] CATH LAB;  Service: Cardiovascular;  Laterality: N/A;  . ADENOIDECTOMY  age 49  . CATARACT EXTRACTION W/ INTRAOCULAR LENS  IMPLANT, BILATERAL  2009  . COLONOSCOPY  2001, 05/02/2003, 01/28/11   2012: Right colon Crohn's, rectal polyp  . COLONOSCOPY  03/31/2012   Procedure: COLONOSCOPY;  Surgeon: Jerene Bears, MD;  Location: [facility];  Service: Gastroenterology;  Laterality: N/A;  . ESOPHAGOGASTRODUODENOSCOPY  01/28/11   Normal  . FOOT SURGERY Right age 54  . MOHS SURGERY Left 11/2013   left ankle parakerotosis   . PERCUTANEOUS LIVER BIOPSY  2007 and 2008  . PILONIDAL CYST EXCISION  age 8  . PLACEMENT OF SETON N/A 11/06/2015   Procedure: PLACEMENT OF SETON;  Surgeon: [name], MD;  Location: [facility];  Service: General;  Laterality: N/A;  . UPPER GASTROINTESTINAL ENDOSCOPY     Social History   Social History Narrative   Single, history of substance abuse in recovery   Previously occupied on medical disability   1 child    Elderly parents involved and he helps them   1 brother in [place]   family history includes Allergies in his  father; Breast cancer in his mother; Clotting disorder in his father; Heart disease in his father; Stomach cancer in his mother; Thyroid cancer in his father.   Review of Systems As per HPI   Objective:   Physical Exam BP 140/84   Pulse 100   Ht 6' 1.25" (1.861 m)   Wt 209 lb 9.6 oz (95.1 kg)   BMI 27.46 kg/m  NAD Eyes anicteric Lungs cta Cor NL abd soft and NT, no HSM/mass Rectal - small open linear area w/o abscesss or dc on the rihght post

## 2017-08-08 NOTE — Patient Instructions (Addendum)
  We have sent the following medications to your pharmacy for you to pick up at your convenience: vicodin   Follow up with Dr Carlean Purl in September.     I appreciate the opportunity to care for you. Silvano Rusk, MD, [facility]

## 2017-08-08 NOTE — Assessment & Plan Note (Signed)
Repeat injectafer

## 2017-08-08 NOTE — Assessment & Plan Note (Signed)
Biochemical remission

## 2017-08-10 DIAGNOSIS — D509 Iron deficiency anemia, unspecified: Secondary | ICD-10-CM | POA: Diagnosis not present

## 2017-08-10 DIAGNOSIS — R748 Abnormal levels of other serum enzymes: Secondary | ICD-10-CM | POA: Diagnosis not present

## 2017-08-10 DIAGNOSIS — Z79899 Other long term (current) drug therapy: Secondary | ICD-10-CM | POA: Diagnosis not present

## 2017-08-10 DIAGNOSIS — M2550 Pain in unspecified joint: Secondary | ICD-10-CM | POA: Diagnosis not present

## 2017-08-10 DIAGNOSIS — K5090 Crohn's disease, unspecified, without complications: Secondary | ICD-10-CM | POA: Diagnosis not present

## 2017-08-15 ENCOUNTER — Ambulatory Visit (HOSPITAL_COMMUNITY)
Admission: RE | Admit: 2017-08-15 | Discharge: 2017-08-15 | Disposition: A | Discharge status: Home / Self Care | Payer: Medicare Other | Source: Ambulatory Visit | Attending: Internal Medicine | Admitting: Internal Medicine

## 2017-08-15 DIAGNOSIS — D500 Iron deficiency anemia secondary to blood loss (chronic): Secondary | ICD-10-CM | POA: Diagnosis not present

## 2017-08-15 MED ORDER — SODIUM CHLORIDE 0.9 % IV SOLN
750.0000 mg | Freq: Once | INTRAVENOUS | Status: AC
  Administered 2017-08-15: 750 mg via INTRAVENOUS
  Filled 2017-08-15: qty 15

## 2017-08-16 ENCOUNTER — Encounter: Payer: Self-pay | Admitting: Internal Medicine

## 2017-08-16 ENCOUNTER — Other Ambulatory Visit: Payer: Self-pay

## 2017-08-16 DIAGNOSIS — D500 Iron deficiency anemia secondary to blood loss (chronic): Secondary | ICD-10-CM

## 2017-08-23 ENCOUNTER — Encounter: Payer: Self-pay | Admitting: Internal Medicine

## 2017-08-24 ENCOUNTER — Ambulatory Visit (HOSPITAL_COMMUNITY)
Admission: RE | Admit: 2017-08-24 | Discharge: 2017-08-24 | Disposition: A | Discharge status: Home / Self Care | Payer: Medicare Other | Source: Ambulatory Visit | Attending: Internal Medicine | Admitting: Internal Medicine

## 2017-08-24 DIAGNOSIS — D500 Iron deficiency anemia secondary to blood loss (chronic): Secondary | ICD-10-CM | POA: Insufficient documentation

## 2017-08-24 MED ORDER — FERRIC CARBOXYMALTOSE 750 MG/15ML IV SOLN
750.0000 mg | Freq: Once | INTRAVENOUS | Status: DC
  Filled 2017-08-24: qty 15

## 2017-08-24 MED ORDER — SODIUM CHLORIDE 0.9 % IV SOLN
750.0000 mg | Freq: Once | INTRAVENOUS | Status: AC
  Administered 2017-08-24: 750 mg via INTRAVENOUS
  Filled 2017-08-24: qty 15

## 2017-08-31 DIAGNOSIS — D485 Neoplasm of uncertain behavior of skin: Secondary | ICD-10-CM | POA: Diagnosis not present

## 2017-08-31 DIAGNOSIS — L565 Disseminated superficial actinic porokeratosis (DSAP): Secondary | ICD-10-CM | POA: Diagnosis not present

## 2017-08-31 DIAGNOSIS — D225 Melanocytic nevi of trunk: Secondary | ICD-10-CM | POA: Diagnosis not present

## 2017-08-31 DIAGNOSIS — L821 Other seborrheic keratosis: Secondary | ICD-10-CM | POA: Diagnosis not present

## 2017-08-31 DIAGNOSIS — Z85828 Personal history of other malignant neoplasm of skin: Secondary | ICD-10-CM | POA: Diagnosis not present

## 2017-08-31 DIAGNOSIS — B078 Other viral warts: Secondary | ICD-10-CM | POA: Diagnosis not present

## 2017-08-31 DIAGNOSIS — L820 Inflamed seborrheic keratosis: Secondary | ICD-10-CM | POA: Diagnosis not present

## 2017-08-31 DIAGNOSIS — L111 Transient acantholytic dermatosis [Grover]: Secondary | ICD-10-CM | POA: Diagnosis not present

## 2017-09-04 ENCOUNTER — Encounter: Payer: Self-pay | Admitting: Internal Medicine

## 2017-09-05 ENCOUNTER — Other Ambulatory Visit: Payer: Self-pay | Admitting: Internal Medicine

## 2017-09-05 DIAGNOSIS — K602 Anal fissure, unspecified: Secondary | ICD-10-CM

## 2017-09-05 MED ORDER — HYDROCODONE-ACETAMINOPHEN 5-325 MG PO TABS: 1.0000 | ORAL_TABLET | Freq: Four times a day (QID) | ORAL | 0 refills | Status: DC | PRN

## 2017-09-05 NOTE — Progress Notes (Signed)
Monthly refill hydrocodone

## 2017-09-10 ENCOUNTER — Other Ambulatory Visit: Payer: Self-pay | Admitting: Internal Medicine

## 2017-09-12 NOTE — Telephone Encounter (Signed)
May I refill Sir?

## 2017-09-13 ENCOUNTER — Encounter: Payer: Self-pay | Admitting: Internal Medicine

## 2017-09-13 DIAGNOSIS — K5090 Crohn's disease, unspecified, without complications: Secondary | ICD-10-CM | POA: Diagnosis not present

## 2017-09-13 DIAGNOSIS — Z79899 Other long term (current) drug therapy: Secondary | ICD-10-CM | POA: Diagnosis not present

## 2017-09-13 NOTE — Telephone Encounter (Signed)
Refill for 90 days

## 2017-09-13 NOTE — Telephone Encounter (Signed)
He is on 60m daily but trying to do 264m

## 2017-09-13 NOTE — Telephone Encounter (Signed)
Find out what dose he is on right now

## 2017-10-03 ENCOUNTER — Encounter: Payer: Self-pay | Admitting: Internal Medicine

## 2017-10-07 ENCOUNTER — Telehealth: Payer: Self-pay | Admitting: Internal Medicine

## 2017-10-07 ENCOUNTER — Other Ambulatory Visit: Payer: Self-pay | Admitting: Internal Medicine

## 2017-10-07 DIAGNOSIS — K602 Anal fissure, unspecified: Secondary | ICD-10-CM

## 2017-10-07 MED ORDER — HYDROCODONE-ACETAMINOPHEN 5-325 MG PO TABS: 1.0000 | ORAL_TABLET | Freq: Four times a day (QID) | ORAL | 0 refills | Status: DC | PRN

## 2017-10-07 NOTE — Telephone Encounter (Signed)
I spoke with the pharmacy Juliann Pulse) and she is aware that Dr Carlean Purl is the prescriber of the named medications.  He is aware of the refill.

## 2017-10-21 ENCOUNTER — Other Ambulatory Visit: Payer: Self-pay

## 2017-10-21 DIAGNOSIS — D500 Iron deficiency anemia secondary to blood loss (chronic): Secondary | ICD-10-CM

## 2017-10-21 DIAGNOSIS — K50818 Crohn's disease of both small and large intestine with other complication: Secondary | ICD-10-CM

## 2017-10-21 NOTE — Progress Notes (Signed)
Patient notified to come for labs.  He is instructed to return to taking prednisone 20 mg daily.  He states he "can't do that".  I explained that he is at risk of an adrenal crisis stopping the prednisone and that [name] wants him to return to taking 20 mg daily.  He refuses to take the prednisone saying that it is making him sick and he won't take it again.  He said he will come for labs on Monday.

## 2017-10-21 NOTE — Progress Notes (Signed)
I spoke to him  He went from 20 mg to 10 mg daily x 10 d and has been off x 4 days and says no nausea, vomiting, lightheadedness, etc  I told him to watch out for those sxs and to restart if they occur since he does not want to restart the prednisone  Await labs

## 2017-10-24 ENCOUNTER — Other Ambulatory Visit (INDEPENDENT_AMBULATORY_CARE_PROVIDER_SITE_OTHER): Payer: Medicare Other

## 2017-10-24 DIAGNOSIS — D500 Iron deficiency anemia secondary to blood loss (chronic): Secondary | ICD-10-CM

## 2017-10-24 DIAGNOSIS — K50818 Crohn's disease of both small and large intestine with other complication: Secondary | ICD-10-CM

## 2017-10-24 LAB — CBC WITH DIFFERENTIAL/PLATELET
BASOS ABS: 0 10*3/uL (ref 0.0–0.1)
Basophils Relative: 0.3 % (ref 0.0–3.0)
EOS ABS: 0.2 10*3/uL (ref 0.0–0.7)
Eosinophils Relative: 1.6 % (ref 0.0–5.0)
HEMATOCRIT: 43.3 % (ref 39.0–52.0)
Hemoglobin: 14.6 g/dL (ref 13.0–17.0)
LYMPHS ABS: 1.9 10*3/uL (ref 0.7–4.0)
LYMPHS PCT: 19 % (ref 12.0–46.0)
MCHC: 33.7 g/dL (ref 30.0–36.0)
MCV: 87.6 fl (ref 78.0–100.0)
MONOS PCT: 15.5 % — AB (ref 3.0–12.0)
Monocytes Absolute: 1.6 10*3/uL — ABNORMAL HIGH (ref 0.1–1.0)
NEUTROS PCT: 63.6 % (ref 43.0–77.0)
Neutro Abs: 6.5 10*3/uL (ref 1.4–7.7)
Platelets: 269 10*3/uL (ref 150.0–400.0)
RBC: 4.94 Mil/uL (ref 4.22–5.81)
RDW: 16.9 % — ABNORMAL HIGH (ref 11.5–15.5)
WBC: 10.2 10*3/uL (ref 4.0–10.5)

## 2017-10-24 LAB — COMPREHENSIVE METABOLIC PANEL
ALK PHOS: 136 U/L — AB (ref 39–117)
ALT: 25 U/L (ref 0–53)
AST: 17 U/L (ref 0–37)
Albumin: 3 g/dL — ABNORMAL LOW (ref 3.5–5.2)
BILIRUBIN TOTAL: 0.5 mg/dL (ref 0.2–1.2)
BUN: 10 mg/dL (ref 6–23)
CALCIUM: 8.7 mg/dL (ref 8.4–10.5)
CO2: 28 mEq/L (ref 19–32)
Chloride: 97 mEq/L (ref 96–112)
Creatinine, Ser: 1.31 mg/dL (ref 0.40–1.50)
GFR: 60.5 mL/min (ref >60.00)
Glucose, Bld: 111 mg/dL — ABNORMAL HIGH (ref 70–99)
Potassium: 3.4 mEq/L — ABNORMAL LOW (ref 3.5–5.1)
Sodium: 133 mEq/L — ABNORMAL LOW (ref 135–145)
TOTAL PROTEIN: 5.6 g/dL — AB (ref 6.0–8.3)

## 2017-10-24 LAB — FERRITIN: Ferritin: 422.9 ng/mL — ABNORMAL HIGH (ref 22.0–322.0)

## 2017-10-24 NOTE — Progress Notes (Signed)
Labs w/ mild abnormalities ferrituin higher so I think inflammation is problem My Chart message

## 2017-11-03 ENCOUNTER — Other Ambulatory Visit: Payer: Self-pay | Admitting: Internal Medicine

## 2017-11-03 ENCOUNTER — Telehealth: Payer: Self-pay

## 2017-11-03 DIAGNOSIS — K602 Anal fissure, unspecified: Secondary | ICD-10-CM

## 2017-11-03 MED ORDER — HYDROCODONE-ACETAMINOPHEN 5-325 MG PO TABS: 1.0000 | ORAL_TABLET | Freq: Four times a day (QID) | ORAL | 0 refills | Status: DC | PRN

## 2017-11-03 NOTE — Telephone Encounter (Signed)
Spoke with [name] and he will come tomorrow to see [place]. He has multiple episodes of diarrhea and will get here as soon as he can. I informed him that Dr Carlean Purl has refilled his Vicodin.

## 2017-11-04 ENCOUNTER — Encounter: Payer: Self-pay | Admitting: Internal Medicine

## 2017-11-04 ENCOUNTER — Ambulatory Visit (INDEPENDENT_AMBULATORY_CARE_PROVIDER_SITE_OTHER): Payer: Medicare Other | Admitting: Internal Medicine

## 2017-11-04 DIAGNOSIS — Z7952 Long term (current) use of systemic steroids: Secondary | ICD-10-CM | POA: Diagnosis not present

## 2017-11-04 DIAGNOSIS — K50814 Crohn's disease of both small and large intestine with abscess: Secondary | ICD-10-CM

## 2017-11-04 DIAGNOSIS — L111 Transient acantholytic dermatosis [Grover]: Secondary | ICD-10-CM

## 2017-11-04 DIAGNOSIS — K604 Rectal fistula: Secondary | ICD-10-CM | POA: Diagnosis not present

## 2017-11-04 DIAGNOSIS — K602 Anal fissure, unspecified: Secondary | ICD-10-CM | POA: Diagnosis not present

## 2017-11-04 MED ORDER — LEVOFLOXACIN 500 MG PO TABS: 500.0000 mg | ORAL_TABLET | Freq: Every day | ORAL | 0 refills | Status: AC

## 2017-11-04 NOTE — Assessment & Plan Note (Signed)
Probably recurrent See Dr. Marcello Moores ? EUA/? Botox He is unwilling to insert diltiazem right now He will f/u me next week by My Chart and if improving will try to get himto start Sitz baths and Abx now

## 2017-11-04 NOTE — Progress Notes (Signed)
[name] 52 y.o. May 12, 1965 [id number]  Assessment & Plan:  CROHN'S DISEASE, LARGE AND SMALL INTESTINES ? Mild flare He does not want higher dose prednisone RTC 2 mos levaquin F/u [name] - ? Needs EUA, ? 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 EUA/? Botox He is unwilling to insert diltiazem right now He will f/u me next week by My Chart and if improving will try to get himto start Sitz baths and Abx now   Grover's disease F/U derm [name]   Long term current use of systemic steroids He stopped and restarted prednisone w/o major problems Reeducated re: need to taper   I appreciate the opportunity to care for this patient. CC: [name] Dr. Harriett Sine   Subjective:   Chief Complaint: diarrhea and anal fissure  HPI [name] is here today with complaints of recurrent rectal pain and bleeding with symptomatic anal fissure, he also expressed some purulent material from his fistulous tract.  Diarrhea has flared up.  About a week or 2 ago he decided to stop his prednisone cold [place].  We had contact via my chart and I got him back on prednisone and is up to 10 mg daily as he did seem to have some steroid withdrawal symptoms, and he believes things are getting better and he is satisfied at 10 mg of prednisone, though he is had some diarrhea he believes that is improving.  When he came off his prednisone his Grovers disease flared quite a bit which was aggravating.  He continues on Entyvio therapy.  As another dose coming up soon.  Labs were ordered and his iron studies are okay potassium slightly low at 3.4 on August 26 LFTs normal kidney function normal B12 level was normal earlier in the month white count 10.2 hemoglobin 14.  Wt Readings from Last 3 Encounters:  11/04/17 206 lb (93.4 kg)  08/24/17 220 lb (99.8 kg)  08/15/17 210 lb (95.3 kg)    Allergies    Allergen Reactions  . Asacol [Mesalamine] Diarrhea    abd pain  . Flagyl [Metronidazole] Diarrhea    abd pain  . Azathioprine Diarrhea    abd pain  . Gluten Meal Diarrhea  . Mycophenolate Mofetil Diarrhea    abd pain  . Other     NUTS; constipation  . Wheat Bran Diarrhea    Constipation; flatulence; abd pain  . Tape Itching and Rash    Please use "paper" tape   Current Meds  Medication Sig  . ascorbic acid (VITAMIN C) 1000 MG tablet Take 2,000 mg by mouth daily.   . Calcium Carb-Cholecalciferol (CALCIUM 1000 + D) 1000-800 MG-UNIT TABS Take by mouth. Takes 2 (2045m) tabs bid  . cholecalciferol (VITAMIN D) 1000 UNITS tablet Take 4,000 Units by mouth 2 (two) times daily.   . clonazePAM (KLONOPIN) 0.5 MG tablet Take 1 tablet (0.5 mg total) by mouth at bedtime as needed (insomnia).  .Marland Kitchendiltiazem 2 % GEL Apply a pea size amount into rectum 2 times a day  . ferric carboxymaltose (INJECTAFER) 750 MG/15ML SOLN injection Please give 750 mg IV and repeat in 7 days  . fluticasone (FLONASE) 50 MCG/ACT nasal spray Place 2 sprays into the nose every morning. Each nostril   . HYDROcodone-acetaminophen (NORCO/VICODIN) 5-325 MG tablet Take 1-2 tablets by mouth every 6 (six) hours as needed for moderate pain.  . mirtazapine (REMERON) 30 MG tablet Take  30 mg by mouth at bedtime.  . predniSONE (DELTASONE) 20 MG tablet TAKE 1 AND 1/2 TABLETS BY MOUTH ONCE DAILY (Patient taking differently: TAKE 1/2 TABLET BY MOUTH ONCE DAILY)  . PROCTOSOL HC 2.5 % rectal cream APPLY RECTALLY 2 TIMES DAILY AS NEEDED FOR HEMORRHOIDS OR ITCHING  . Vedolizumab (ENTYVIO IV) Inject into the vein. Every 2 months  . vitamin A 25000 UNIT capsule Take 25,000 Units by mouth daily.   Past Medical History:  Diagnosis Date  . Allergy   . Anal fistula   . Anxiety   . 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 Autoimmune hepatitis ([place]) 01/19/2013  . Cancer of skin of neck   . Cataract   . Chronic mesenteric ischemia ([place])   .  Chronic pain syndrome 07/09/2016  . Crohn's disease of small and large intestines ([place])    followed by dr [name]  . Dairy product intolerance   . Diarrhea, functional   . Family history of adverse reaction to anesthesia   . Grover's disease    transient acantholytic dermatosis  . History of alcohol abuse   . History of basal cell carcinoma excision    2013 left leg  . History of Clostridium difficile    10/ 2014  . History of multiple concussions    x6   last one Jan 2017 per pt--  no residual  . History of substance abuse    quit 1997 per pt  . History of suicide attempt    05-18-2012  overdose /  failure to thrive  . Hypercholesterolemia   . Iron deficiency anemia due to chronic blood loss   . Major depression, recurrent, chronic ([place])   . Osteopenia   . Personal history of adenomatous colonic polyps 12/2010, 03/2012   12/2010 - 8 mm serrated adenoma of rectum  . Portal vein thrombosis 03/21/2015   right  . Primary sclerosing cholangitis    ? hepatitis overlap - liver bx x 2 and MRCP  . Seasonal allergies   . Substance abuse ([place]) 1997   Alcohol   Past Surgical History:  Procedure Laterality Date  . ABDOMINAL AORTAGRAM N/A 03/29/2012   Procedure: ABDOMINAL Maxcine Ham;  Surgeon: Serafina Mitchell, [name];  Location[facility] C[facility] LAB;  Service: Cardiovascular;  Laterality: N/A;  . ADENOIDECTOMY  age 56  . CATARACT EXTRACTION W/ INTRAOCULAR LENS  IMPLANT, BILATERAL  2009  . COLONOSCOPY  2001, 05/02/2003, 01/28/11   2012: Right colon Crohn's, rectal polyp  . COLONOSCOPY  03/31/2012   Procedure: COLONOSCOPY;  Surgeon: Jerene Bears, [name];  Location: [place];  Service: Gastroenterology;  Laterality: N/A;  . ESOPHAGOGASTRODUODENOSCOPY  01/28/11   Normal  . FOOT SURGERY Right age 73  . MOHS SURGERY Left 11/2013   left ankle parakerotosis   . PERCUTANEOUS LIVER BIOPSY  2007 and 2008  . PILONIDAL CYST EXCISION  age 66  . PLACEMENT OF SETON N/A 11/06/2015   Procedure: PLACEMENT OF SETON;   Surgeon: [name], [name];  Location: [facility];  Service: General;  Laterality: N/A;  . UPPER GASTROINTESTINAL ENDOSCOPY     Social History   Social History Narrative   Single, history of substance abuse in recovery   Previously occupied on medical disability   1 child    Elderly parents involved and he helps them   1 brother in [place]   family history includes Allergies in his father; Breast cancer in his mother; Clotting disorder in  his father; Heart disease in his father; Stomach cancer in his mother; Thyroid cancer in his father.   Review of Systems As per HPI.  He is been somewhat fatigued and weak he has had chills but no fever.  Objective:   Physical Exam BP 110/74   Pulse 96   Ht 6' 2"  (1.88 m)   Wt 206 lb (93.4 kg)   BMI 26.45 kg/m  NAD Lungs cta Cor NL abd soft NT  Rectal - open fistula R post - mild induration ? sacnt pus - tender and very tender posterior DRE with stenosis/spasm  Skin papules, erythematous all over body though not diffuse.  Patchy.

## 2017-11-04 NOTE — Assessment & Plan Note (Signed)
See Dr. Marcello Moores again

## 2017-11-04 NOTE — Assessment & Plan Note (Signed)
?   Mild flare He does not want higher dose prednisone RTC 2 mos levaquin F/u Dr. Marcello Moores - ? Needs EUA, ? botox

## 2017-11-04 NOTE — Assessment & Plan Note (Signed)
He stopped and restarted prednisone w/o major problems Reeducated re: need to taper

## 2017-11-04 NOTE — Assessment & Plan Note (Signed)
F/U derm Dr. Elvera Lennox

## 2017-11-04 NOTE — Patient Instructions (Signed)
We have sent the following medications to your pharmacy for you to pick up at your convenience: Levaquin for 14 days   You have been scheduled for an appointment with Dr [name] at [facility]. Your appointment is on 11/25/17 at 2:50pm. Please arrive at 2:30pm for registration. Make certain to bring a list of current medications, including any over the counter medications or vitamins. Also bring your co-pay if you have one as well as your insurance cards. [facility] is located at [address], [address]. Should you need to reschedule your appointment, please contact them at [phone].   Continue to do warm soaks per Dr Carlean Purl.  Please up date [place] with how your doing.   Dr Carlean Purl would like to see you in November. Call us back in 3 weeks or so and hopefully we will have it available.   I appreciate the opportunity to care for you. Silvano Rusk, MD, [facility]

## 2017-11-08 DIAGNOSIS — K5090 Crohn's disease, unspecified, without complications: Secondary | ICD-10-CM | POA: Diagnosis not present

## 2017-11-09 ENCOUNTER — Other Ambulatory Visit: Payer: Self-pay | Admitting: Internal Medicine

## 2017-11-09 MED ORDER — AMOXICILLIN-POT CLAVULANATE 875-125 MG PO TABS: 1.0000 | ORAL_TABLET | Freq: Two times a day (BID) | ORAL | 0 refills | Status: AC

## 2017-11-23 DIAGNOSIS — Z85828 Personal history of other malignant neoplasm of skin: Secondary | ICD-10-CM | POA: Diagnosis not present

## 2017-11-23 DIAGNOSIS — L111 Transient acantholytic dermatosis [Grover]: Secondary | ICD-10-CM | POA: Diagnosis not present

## 2017-11-23 DIAGNOSIS — Z79899 Other long term (current) drug therapy: Secondary | ICD-10-CM | POA: Diagnosis not present

## 2017-11-23 DIAGNOSIS — C44629 Squamous cell carcinoma of skin of left upper limb, including shoulder: Secondary | ICD-10-CM | POA: Diagnosis not present

## 2017-11-25 DIAGNOSIS — K50113 Crohn's disease of large intestine with fistula: Secondary | ICD-10-CM | POA: Diagnosis not present

## 2017-11-30 DIAGNOSIS — K602 Anal fissure, unspecified: Secondary | ICD-10-CM

## 2017-12-01 MED ORDER — HYDROCODONE-ACETAMINOPHEN 5-325 MG PO TABS: 1.0000 | ORAL_TABLET | Freq: Four times a day (QID) | ORAL | 0 refills | Status: DC | PRN

## 2017-12-30 ENCOUNTER — Other Ambulatory Visit: Payer: Self-pay | Admitting: Internal Medicine

## 2017-12-30 DIAGNOSIS — Z23 Encounter for immunization: Secondary | ICD-10-CM | POA: Diagnosis not present

## 2017-12-30 DIAGNOSIS — F411 Generalized anxiety disorder: Secondary | ICD-10-CM | POA: Diagnosis not present

## 2017-12-30 DIAGNOSIS — K602 Anal fissure, unspecified: Secondary | ICD-10-CM

## 2017-12-30 DIAGNOSIS — R030 Elevated blood-pressure reading, without diagnosis of hypertension: Secondary | ICD-10-CM | POA: Diagnosis not present

## 2017-12-30 MED ORDER — HYDROCODONE-ACETAMINOPHEN 5-325 MG PO TABS: 1.0000 | ORAL_TABLET | Freq: Four times a day (QID) | ORAL | 0 refills | Status: DC | PRN

## 2018-01-03 DIAGNOSIS — K5090 Crohn's disease, unspecified, without complications: Secondary | ICD-10-CM | POA: Diagnosis not present

## 2018-01-20 ENCOUNTER — Other Ambulatory Visit: Payer: Self-pay | Admitting: Internal Medicine

## 2018-01-20 NOTE — Telephone Encounter (Signed)
Per Dr Carlean Purl okay to refill.

## 2018-01-20 NOTE — Telephone Encounter (Signed)
Okay to refill Sir?

## 2018-01-25 ENCOUNTER — Other Ambulatory Visit: Payer: Self-pay | Admitting: Internal Medicine

## 2018-01-25 DIAGNOSIS — K602 Anal fissure, unspecified: Secondary | ICD-10-CM

## 2018-01-25 MED ORDER — HYDROCODONE-ACETAMINOPHEN 5-325 MG PO TABS: 1.0000 | ORAL_TABLET | Freq: Four times a day (QID) | ORAL | 0 refills | Status: DC | PRN

## 2018-02-09 DIAGNOSIS — Z79899 Other long term (current) drug therapy: Secondary | ICD-10-CM | POA: Diagnosis not present

## 2018-02-09 DIAGNOSIS — K5090 Crohn's disease, unspecified, without complications: Secondary | ICD-10-CM | POA: Diagnosis not present

## 2018-02-09 DIAGNOSIS — M2550 Pain in unspecified joint: Secondary | ICD-10-CM | POA: Diagnosis not present

## 2018-02-09 DIAGNOSIS — D509 Iron deficiency anemia, unspecified: Secondary | ICD-10-CM | POA: Diagnosis not present

## 2018-02-09 DIAGNOSIS — R748 Abnormal levels of other serum enzymes: Secondary | ICD-10-CM | POA: Diagnosis not present

## 2018-02-16 ENCOUNTER — Other Ambulatory Visit: Payer: Self-pay | Admitting: Internal Medicine

## 2018-02-16 ENCOUNTER — Other Ambulatory Visit (INDEPENDENT_AMBULATORY_CARE_PROVIDER_SITE_OTHER): Payer: Medicare Other

## 2018-02-16 DIAGNOSIS — K754 Autoimmune hepatitis: Secondary | ICD-10-CM | POA: Diagnosis not present

## 2018-02-16 DIAGNOSIS — D500 Iron deficiency anemia secondary to blood loss (chronic): Secondary | ICD-10-CM

## 2018-02-16 DIAGNOSIS — K602 Anal fissure, unspecified: Secondary | ICD-10-CM

## 2018-02-16 LAB — CBC WITH DIFFERENTIAL/PLATELET
Basophils Absolute: 0 10*3/uL (ref 0.0–0.1)
Basophils Relative: 0.2 % (ref 0.0–3.0)
EOS PCT: 0.1 % (ref 0.0–5.0)
Eosinophils Absolute: 0 10*3/uL (ref 0.0–0.7)
HEMATOCRIT: 46 % (ref 39.0–52.0)
HEMOGLOBIN: 15.1 g/dL (ref 13.0–17.0)
Lymphocytes Relative: 7 % — ABNORMAL LOW (ref 12.0–46.0)
Lymphs Abs: 1 10*3/uL (ref 0.7–4.0)
MCHC: 32.7 g/dL (ref 30.0–36.0)
MCV: 85.9 fl (ref 78.0–100.0)
MONOS PCT: 4.6 % (ref 3.0–12.0)
Monocytes Absolute: 0.7 10*3/uL (ref 0.1–1.0)
Neutro Abs: 13 10*3/uL — ABNORMAL HIGH (ref 1.4–7.7)
Neutrophils Relative %: 88.1 % — ABNORMAL HIGH (ref 43.0–77.0)
Platelets: 298 10*3/uL (ref 150.0–400.0)
RBC: 5.36 Mil/uL (ref 4.22–5.81)
RDW: 14.3 % (ref 11.5–15.5)
WBC: 14.8 10*3/uL — AB (ref 4.0–10.5)

## 2018-02-16 LAB — HEPATIC FUNCTION PANEL
ALT: 45 U/L (ref 0–53)
AST: 26 U/L (ref 0–37)
Albumin: 3.5 g/dL (ref 3.5–5.2)
Alkaline Phosphatase: 138 U/L — ABNORMAL HIGH (ref 39–117)
BILIRUBIN DIRECT: 0.1 mg/dL (ref 0.0–0.3)
BILIRUBIN TOTAL: 0.3 mg/dL (ref 0.2–1.2)
Total Protein: 6.3 g/dL (ref 6.0–8.3)

## 2018-02-16 LAB — FERRITIN: FERRITIN: 44.6 ng/mL (ref 22.0–322.0)

## 2018-02-16 MED ORDER — HYDROCODONE-ACETAMINOPHEN 5-325 MG PO TABS: 1.0000 | ORAL_TABLET | Freq: Four times a day (QID) | ORAL | 0 refills | Status: DC | PRN

## 2018-02-16 NOTE — Progress Notes (Signed)
Ferritin low NL  Needs:  Injectafer 750 mg IV  x 2 doses > 7 days apart - Jan would be ok  Needs f/u me Jan or Feb

## 2018-02-23 NOTE — Telephone Encounter (Addendum)
Left message for patient to call back on cell phone  Called patient's alternative phone number-spoke with patient-patient offered an appt with APP and/or [name]; patient declined an appt with either provider at this time; patient reports he will "call back if I need to be seen but for right now -I am fine"; patient verbalized understanding to call back if questions/concerns arise; Patient verbalized understanding of information/instructions;

## 2018-02-23 NOTE — Telephone Encounter (Signed)
-----   Message from [name], MD sent at 02/23/2018  1:28 AM EST ----- Regarding: call him please Pl;ease see last My Chart note and check on him.  He may need an APP  visit or I will check on him again when back if he is no worse

## 2018-02-28 DIAGNOSIS — D509 Iron deficiency anemia, unspecified: Secondary | ICD-10-CM | POA: Diagnosis not present

## 2018-03-07 DIAGNOSIS — D509 Iron deficiency anemia, unspecified: Secondary | ICD-10-CM | POA: Diagnosis not present

## 2018-03-20 DIAGNOSIS — K5090 Crohn's disease, unspecified, without complications: Secondary | ICD-10-CM | POA: Diagnosis not present

## 2018-04-07 ENCOUNTER — Ambulatory Visit (INDEPENDENT_AMBULATORY_CARE_PROVIDER_SITE_OTHER): Payer: Medicare Other | Admitting: Internal Medicine

## 2018-04-07 ENCOUNTER — Encounter: Payer: Self-pay | Admitting: Internal Medicine

## 2018-04-07 ENCOUNTER — Other Ambulatory Visit (INDEPENDENT_AMBULATORY_CARE_PROVIDER_SITE_OTHER): Payer: Medicare Other

## 2018-04-07 DIAGNOSIS — K50814 Crohn's disease of both small and large intestine with abscess: Secondary | ICD-10-CM

## 2018-04-07 DIAGNOSIS — R634 Abnormal weight loss: Secondary | ICD-10-CM | POA: Diagnosis not present

## 2018-04-07 DIAGNOSIS — D500 Iron deficiency anemia secondary to blood loss (chronic): Secondary | ICD-10-CM

## 2018-04-07 DIAGNOSIS — K754 Autoimmune hepatitis: Secondary | ICD-10-CM

## 2018-04-07 DIAGNOSIS — K604 Rectal fistula: Secondary | ICD-10-CM | POA: Diagnosis not present

## 2018-04-07 LAB — CBC WITH DIFFERENTIAL/PLATELET
Basophils Absolute: 0 10*3/uL (ref 0.0–0.1)
Basophils Relative: 0.2 % (ref 0.0–3.0)
EOS ABS: 0 10*3/uL (ref 0.0–0.7)
EOS PCT: 0 % (ref 0.0–5.0)
HCT: 44 % (ref 39.0–52.0)
HEMOGLOBIN: 14.8 g/dL (ref 13.0–17.0)
Lymphocytes Relative: 4.2 % — ABNORMAL LOW (ref 12.0–46.0)
Lymphs Abs: 0.4 10*3/uL — ABNORMAL LOW (ref 0.7–4.0)
MCHC: 33.6 g/dL (ref 30.0–36.0)
MCV: 84.9 fl (ref 78.0–100.0)
MONO ABS: 0.4 10*3/uL (ref 0.1–1.0)
Monocytes Relative: 3.9 % (ref 3.0–12.0)
Neutro Abs: 8.5 10*3/uL — ABNORMAL HIGH (ref 1.4–7.7)
Neutrophils Relative %: 91.7 % — ABNORMAL HIGH (ref 43.0–77.0)
Platelets: 240 10*3/uL (ref 150.0–400.0)
RBC: 5.19 Mil/uL (ref 4.22–5.81)
RDW: 17 % — ABNORMAL HIGH (ref 11.5–15.5)
WBC: 9.3 10*3/uL (ref 4.0–10.5)

## 2018-04-07 LAB — COMPREHENSIVE METABOLIC PANEL
ALK PHOS: 114 U/L (ref 39–117)
ALT: 36 U/L (ref 0–53)
AST: 22 U/L (ref 0–37)
Albumin: 3.4 g/dL — ABNORMAL LOW (ref 3.5–5.2)
BUN: 9 mg/dL (ref 6–23)
CO2: 28 mEq/L (ref 19–32)
Calcium: 8.2 mg/dL — ABNORMAL LOW (ref 8.4–10.5)
Chloride: 102 mEq/L (ref 96–112)
Creatinine, Ser: 1.01 mg/dL (ref 0.40–1.50)
GFR: 76.72 mL/min (ref >60.00)
Glucose, Bld: 104 mg/dL — ABNORMAL HIGH (ref 70–99)
POTASSIUM: 3.7 meq/L (ref 3.5–5.1)
SODIUM: 139 meq/L (ref 135–145)
Total Bilirubin: 0.4 mg/dL (ref 0.2–1.2)
Total Protein: 5.8 g/dL — ABNORMAL LOW (ref 6.0–8.3)

## 2018-04-07 LAB — FERRITIN: Ferritin: 359.1 ng/mL — ABNORMAL HIGH (ref 22.0–322.0)

## 2018-04-07 NOTE — Assessment & Plan Note (Signed)
Check LFTs 

## 2018-04-07 NOTE — Progress Notes (Signed)
[name] 53 y.o. 05-16-1965 [id number]  Assessment & Plan:  CROHN'S DISEASE, LARGE AND SMALL INTESTINES Perianal prbolems worse. ? If weight loss due to worsening crohn's Colonoscopy 04/2017 w/ numerous inflammatory pseudopolyps He does not want to do CT scan - we discussed possibly imaging Also declined antibiotics for perianal disease  Perirectal fistula Active with abscess - does not appear to need surgery (though could help don't think absolute indication) He declined Abx   Anemia due to chronic blood loss F/U CBC and ferritin S/p injectafer x 2 in early Jan 2020  Loss of weight Crohn's? Psych?   Autoimmune hepatitis-PSC overlap Check LFT's  UX:NATF, [name] Luo, MD Dr. Dossie Der  Subjective:   Chief Complaint: Weight loss  HPI [name] is here for follow-up today, he has lost weight about 20 pounds since September.  He reports that he stopped all of his medicines except prednisone and Entyvio in January because he thought taking medications and even eating made him bleed more.  He had a spell of "black diarrhea".  He went to a diet of only 6 large rice flower biscuits daily.  In the last week or so he is started adding 1 piece of chicken breast a day.  He feels like his appetite is starting to come back some though it was gone for about a month.  Sleep was interrupted.  He has some anal tenderness.  He has bleeding and purulent discharge from the perianal area as in the past.  No fevers though has had some chills at times.  He denies significant depression as in the past.  He felt like he had to stop eating so much to help himself get better and he is convinced that he is actually improving since he did this.  He had Injectafer infusion x2 in early January.  His ferritin was down to 44 in December.  Hemoglobin was 15 Wt Readings from Last 3 Encounters:  04/07/18 178 lb (80.7 kg)  11/04/17 206 lb (93.4 kg)  08/24/17 220 lb (99.8 kg)    Allergies  Allergen  Reactions  . Asacol [Mesalamine] Diarrhea    abd pain  . Flagyl [Metronidazole] Diarrhea    abd pain  . Azathioprine Diarrhea    abd pain  . Gluten Meal Diarrhea  . Mycophenolate Mofetil Diarrhea    abd pain  . Other     NUTS; constipation  . Wheat Bran Diarrhea    Constipation; flatulence; abd pain  . Tape Itching and Rash    Please use "paper" tape   Current Meds  Medication Sig  . predniSONE (DELTASONE) 20 MG tablet TAKE 1 AND 1/2 TABLETS BY MOUTH EVERY DAY  . Vedolizumab (ENTYVIO IV) Inject into the vein. Every 2 months   Past Medical History:  Diagnosis Date  . Allergy   . Anal fistula   . Anxiety   . 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 Autoimmune hepatitis ([place]) 01/19/2013  . Cancer of skin of neck   . Cataract   . Chronic mesenteric ischemia ([place])   . Chronic pain syndrome 07/09/2016  . Crohn's disease of small and large intestines ([place])    followed by dr Glendell Docker Cutberto [place]  . Dairy product intolerance   . Diarrhea, functional   . Family history of adverse reaction to anesthesia   . Grover's disease    transient acantholytic dermatosis  . History of alcohol abuse   . History of basal cell carcinoma excision  2013 left leg  . History of Clostridium difficile    10/ 2014  . History of multiple concussions    x6   last one Jan 2017 per pt--  no residual  . History of substance abuse ([place])    quit 1997 per pt  . History of suicide attempt    05-18-2012  overdose /  failure to thrive  . Hypercholesterolemia   . Iron deficiency anemia due to chronic blood loss   . Major depression, recurrent, chronic ([place])   . Osteopenia   . Personal history of adenomatous colonic polyps 12/2010, 03/2012   12/2010 - 8 mm serrated adenoma of rectum  . Portal vein thrombosis 03/21/2015   right  . Primary sclerosing cholangitis    ? hepatitis overlap - liver bx x 2 and MRCP  . Seasonal allergies   . Substance abuse ([place]) 1997   Alcohol   Past Surgical History:    Procedure Laterality Date  . ABDOMINAL AORTAGRAM N/A 03/29/2012   Procedure: ABDOMINAL Maxcine Ham;  Surgeon: Serafina Mitchell, MD;  Location: [facility] CATH LAB;  Service: Cardiovascular;  Laterality: N/A;  . ADENOIDECTOMY  age 1  . CATARACT EXTRACTION W/ INTRAOCULAR LENS  IMPLANT, BILATERAL  2009  . COLONOSCOPY  2001, 05/02/2003, 01/28/11   2012: Right colon Crohn's, rectal polyp  . COLONOSCOPY  03/31/2012   Procedure: COLONOSCOPY;  Surgeon: Jerene Bears, MD;  Location: [place];  Service: Gastroenterology;  Laterality: N/A;  . ESOPHAGOGASTRODUODENOSCOPY  01/28/11   Normal  . FOOT SURGERY Right age 66  . MOHS SURGERY Left 11/2013   left ankle parakerotosis   . PERCUTANEOUS LIVER BIOPSY  2007 and 2008  . PILONIDAL CYST EXCISION  age 25  . PLACEMENT OF SETON N/A 11/06/2015   Procedure: PLACEMENT OF SETON;  Surgeon: [name], MD;  Location: [place];  Service: General;  Laterality: N/A;  . UPPER GASTROINTESTINAL ENDOSCOPY     Social History   Social History Narrative   Single, history of substance abuse in recovery   Previously occupied on medical disability   1 child    Elderly parents involved and he helps them   1 brother in [place]   family history includes Allergies in his father; Breast cancer in his mother; Clotting disorder in his father; Heart disease in his father; Stomach cancer in his mother; Thyroid cancer in his father.   Review of Systems As per HPI  Objective:   Physical Exam BP 102/72   Pulse 77   Ht 6' 2"  (1.88 m)   Wt 178 lb (80.7 kg)   BMI 22.85 kg/m  Eyes anicteric Lungs cta Cor NL abd BS increased soft and nontender no HSM/mass Rectal  Right post quadrant pink/erythematous with nodules x 3 largest drains pus and is tender Stenosis and tenderness on DRE

## 2018-04-07 NOTE — Patient Instructions (Signed)
Your provider has requested that you go to the basement level for lab work before leaving today. Press "B" on the elevator. The lab is located at the first door on the left as you exit the elevator.   We will call you with results and plans.   I appreciate the opportunity to care for you. Silvano Rusk, MD, [facility]

## 2018-04-07 NOTE — Assessment & Plan Note (Signed)
Crohn's? Psych?

## 2018-04-07 NOTE — Assessment & Plan Note (Addendum)
F/U CBC and ferritin S/p injectafer x 2 in early Jan 2020

## 2018-04-07 NOTE — Assessment & Plan Note (Addendum)
Active with abscess - does not appear to need surgery (though could help don't think absolute indication) He declined Abx

## 2018-04-07 NOTE — Assessment & Plan Note (Addendum)
Perianal prbolems worse. ? If weight loss due to worsening crohn's Colonoscopy 04/2017 w/ numerous inflammatory pseudopolyps He does not want to do CT scan - we discussed possibly imaging Also declined antibiotics for perianal disease

## 2018-04-11 NOTE — Progress Notes (Signed)
Labs ok  My Chart

## 2018-04-25 ENCOUNTER — Other Ambulatory Visit (INDEPENDENT_AMBULATORY_CARE_PROVIDER_SITE_OTHER): Payer: Medicare Other

## 2018-04-25 ENCOUNTER — Ambulatory Visit (INDEPENDENT_AMBULATORY_CARE_PROVIDER_SITE_OTHER): Payer: Medicare Other | Admitting: Internal Medicine

## 2018-04-25 ENCOUNTER — Encounter: Payer: Self-pay | Admitting: Internal Medicine

## 2018-04-25 DIAGNOSIS — K754 Autoimmune hepatitis: Secondary | ICD-10-CM

## 2018-04-25 DIAGNOSIS — K50813 Crohn's disease of both small and large intestine with fistula: Secondary | ICD-10-CM

## 2018-04-25 DIAGNOSIS — R634 Abnormal weight loss: Secondary | ICD-10-CM | POA: Diagnosis not present

## 2018-04-25 DIAGNOSIS — F332 Major depressive disorder, recurrent severe without psychotic features: Secondary | ICD-10-CM

## 2018-04-25 LAB — COMPREHENSIVE METABOLIC PANEL
ALT: 32 U/L (ref 0–53)
AST: 31 U/L (ref 0–37)
Albumin: 3.2 g/dL — ABNORMAL LOW (ref 3.5–5.2)
Alkaline Phosphatase: 161 U/L — ABNORMAL HIGH (ref 39–117)
BUN: 8 mg/dL (ref 6–23)
CALCIUM: 8.4 mg/dL (ref 8.4–10.5)
CHLORIDE: 99 meq/L (ref 96–112)
CO2: 29 meq/L (ref 19–32)
CREATININE: 1.09 mg/dL (ref 0.40–1.50)
GFR: 70.24 mL/min (ref >60.00)
Glucose, Bld: 99 mg/dL (ref 70–99)
POTASSIUM: 3 meq/L — AB (ref 3.5–5.1)
Sodium: 137 mEq/L (ref 135–145)
Total Bilirubin: 0.5 mg/dL (ref 0.2–1.2)
Total Protein: 5.7 g/dL — ABNORMAL LOW (ref 6.0–8.3)

## 2018-04-25 LAB — PROTIME-INR
INR: 1.2 ratio — AB (ref 0.8–1.0)
Prothrombin Time: 14.4 s — ABNORMAL HIGH (ref 9.6–13.1)

## 2018-04-25 LAB — CBC WITH DIFFERENTIAL/PLATELET
BASOS ABS: 0 10*3/uL (ref 0.0–0.1)
Basophils Relative: 0.4 % (ref 0.0–3.0)
Eosinophils Absolute: 0.1 10*3/uL (ref 0.0–0.7)
Eosinophils Relative: 1.1 % (ref 0.0–5.0)
HCT: 43.8 % (ref 39.0–52.0)
Hemoglobin: 14.6 g/dL (ref 13.0–17.0)
LYMPHS ABS: 0.7 10*3/uL (ref 0.7–4.0)
Lymphocytes Relative: 8.6 % — ABNORMAL LOW (ref 12.0–46.0)
MCHC: 33.4 g/dL (ref 30.0–36.0)
MCV: 84.2 fl (ref 78.0–100.0)
MONO ABS: 1 10*3/uL (ref 0.1–1.0)
MONOS PCT: 11 % (ref 3.0–12.0)
NEUTROS ABS: 6.9 10*3/uL (ref 1.4–7.7)
NEUTROS PCT: 78.9 % — AB (ref 43.0–77.0)
PLATELETS: 243 10*3/uL (ref 150.0–400.0)
RBC: 5.2 Mil/uL (ref 4.22–5.81)
RDW: 17 % — ABNORMAL HIGH (ref 11.5–15.5)
WBC: 8.7 10*3/uL (ref 4.0–10.5)

## 2018-04-25 NOTE — Assessment & Plan Note (Addendum)
His physical exam appears similar to February 7. I do not know how much of his symptoms are his Crohn's disease, clearly the diarrhea and bleeding are probably related.  On exam he has an anal stricture and he has his perianal disease though I saw induration I do not see any active abscess but that certainly possible.  Work-up with labs and a CT scan of the abdomen and pelvis.  Labs to include CBC, CMET, and coags.

## 2018-04-25 NOTE — Assessment & Plan Note (Addendum)
LFTs have been normal.  Previous imaging, not since 2017, has suggested cirrhosis on imaging though is not had low platelets or coagulopathy.  It certainly possible however.  He is at increased risk of cholangiocarcinoma and hepatocellular carcinoma it seems.  Again liver chemistries normal on February 7.  CT scan will help [place] reassess here.  He is also had a portal vein thrombosis in the past.  Question if that has recurred.

## 2018-04-25 NOTE — Progress Notes (Signed)
Dev Dhondt Doan 53 y.o. 1965/03/29 [id number]  Assessment & Plan:  CROHN'S DISEASE, LARGE AND SMALL INTESTINES His physical exam appears similar to February 7. I do not know how much of his symptoms are his Crohn's disease, clearly the diarrhea and bleeding are probably related.  On exam he has an anal stricture and he has his perianal disease though I saw induration I do not see any active abscess but that certainly possible.  Work-up with labs and a CT scan of the abdomen and pelvis.  Labs to include CBC, CMET, and coags.   Loss of weight His Crohn's disease or his depression or other causes are possible.  I tried to get him to resume his Remeron but he will not.  It would help his appetite. We will see what the CT scan tells [place].  Could need repeat endoscopic evaluation.  Major depressive disorder, recurrent episode, severe He has stopped his medications.  He is not suicidal, he is having anxiety component and I think he is depressed and I wonder if that is not driving a lot of this.  While I do not think he is psychotic his aberrant thought processes and associations of medications with signs and symptoms have resurfaced.  He may need psychiatric help if I can get him there.  I do not think he is necessarily a significant threat to himself or anything like that now but will monitor.   Autoimmune hepatitis-PSC overlap LFTs have been normal.  Previous imaging, not since 2017, has suggested cirrhosis on imaging though is not had low platelets or coagulopathy.  It certainly possible however.  He is at increased risk of cholangiocarcinoma and hepatocellular carcinoma it seems.  Again liver chemistries normal on February 7.  CT scan will help [place] reassess here.  He is also had a portal vein thrombosis in the past.  Question if that has recurred.  I appreciate the opportunity to care for this patient. CC: [name] Cruel, MD     Subjective:   Chief Complaint: Diarrhea bleeding  weight loss abdominal pain  HPI [name] is here with his mom, he is having 5-6 diarrheal stools a day there can be some blood mixed in, "medium red", also some dark black stools at times.  This sounds similar to what he told me in early February.  He had messaged me saying that he thinks it taking his prednisone makes things worse.  He is having an abdominal pain periumbilical at different times not necessarily related to eating but he thinks on higher doses of prednisone it was worse.  The pain is somewhat sharp in character.  It can last for minutes to longer.  A bit vague in the description.  He also believes his other medications such as Remeron and other pills have caused pain and diarrhea so he had stopped those.  Continues on his every 8-week Entyvio infusion.  See my note of 04/07/2018.  He is losing weight, his appetite is off.  He is struggling to continue to eat he wonders about going into the hospital.  He is not suicidal or homicidal or planning on causing harm to himself though he is down and depressed and having some problems with sleeping.  Some nights he sleeps okay.  His perianal lesion is "about the same".  His last colonoscopy was in March 2019 he had a lot of inflammatory/pseudopolyps.  Really too numerous to count.  There was no other active mucosal disease.  He is admittedly very  anxious and worried about what is going on.  Wt Readings from Last 3 Encounters:  04/25/18 170 lb 9.6 oz (77.4 kg)  04/07/18 178 lb (80.7 kg)  11/04/17 206 lb (93.4 kg)    Allergies  Allergen Reactions  . Asacol [Mesalamine] Diarrhea    abd pain  . Flagyl [Metronidazole] Diarrhea    abd pain  . Azathioprine Diarrhea    abd pain  . Gluten Meal Diarrhea  . Mycophenolate Mofetil Diarrhea    abd pain  . Other     NUTS; constipation  . Wheat Bran Diarrhea    Constipation; flatulence; abd pain  . Tape Itching and Rash    Please use "paper" tape   Current Meds  Medication Sig  . ascorbic acid  (VITAMIN C) 1000 MG tablet Take 2,000 mg by mouth daily.   . Calcium Carb-Cholecalciferol (CALCIUM 1000 + D) 1000-800 MG-UNIT TABS Take by mouth. Takes 2 (2054m) tabs bid  . cholecalciferol (VITAMIN D) 1000 UNITS tablet Take 4,000 Units by mouth 2 (two) times daily.   . clonazePAM (KLONOPIN) 0.5 MG tablet Take 1 tablet (0.5 mg total) by mouth at bedtime as needed (insomnia).  . fluticasone (FLONASE) 50 MCG/ACT nasal spray Place 2 sprays into the nose every morning. Each nostril   . HYDROcodone-acetaminophen (NORCO/VICODIN) 5-325 MG tablet Take 1-2 tablets by mouth every 6 (six) hours as needed for moderate pain. Written rx printed in advance May fill on or after 02/24/2018  . mirtazapine (REMERON) 30 MG tablet Take 30 mg by mouth at bedtime.  . predniSONE (DELTASONE) 10 MG tablet Take 10 mg by mouth daily with breakfast.  . Vedolizumab (ENTYVIO IV) Inject into the vein. Every 2 months  . vitamin A 25000 UNIT capsule Take 25,000 Units by mouth daily.   Past Medical History:  Diagnosis Date  . Allergy   . Anal fistula   . Anxiety   . 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 Autoimmune hepatitis ([place] 01/19/2013  . Cancer of skin of neck   . Cataract   . Chronic mesenteric ischemia ([place]   . Chronic pain syndrome 07/09/2016  . Crohn's disease of small and large intestines ([place]    followed by dr [name]  . Dairy product intolerance   . Diarrhea, functional   . Family history of adverse reaction to anesthesia   . Grover's disease    transient acantholytic dermatosis  . History of alcohol abuse   . History of basal cell carcinoma excision    2013 left leg  . History of Clostridium difficile    10/ 2014  . History of multiple concussions    x6   last one Jan 2017 per pt--  no residual  . History of substance abuse ([place]    quit 1997 per pt  . History of suicide attempt    05-18-2012  overdose /  failure to thrive  . Hypercholesterolemia   . Iron deficiency anemia due to  chronic blood loss   . Major depression, recurrent, chronic ([place]   . Osteopenia   . Personal history of adenomatous colonic polyps 12/2010, 03/2012   12/2010 - 8 mm serrated adenoma of rectum  . Portal vein thrombosis 03/21/2015   right  . Primary sclerosing cholangitis    ? hepatitis overlap - liver bx x 2 and MRCP  . Seasonal allergies   . Substance abuse ([place] 1997   Alcohol   Past Surgical History:  Procedure Laterality Date  .  ABDOMINAL AORTAGRAM N/A 03/29/2012   Procedure: ABDOMINAL Maxcine Ham;  Surgeon: Serafina Mitchell, MD;  Location: [facility] CATH LAB;  Service: Cardiovascular;  Laterality: N/A;  . ADENOIDECTOMY  age 37  . CATARACT EXTRACTION W/ INTRAOCULAR LENS  IMPLANT, BILATERAL  2009  . COLONOSCOPY  2001, 05/02/2003, 01/28/11   2012: Right colon Crohn's, rectal polyp  . COLONOSCOPY  03/31/2012   Procedure: COLONOSCOPY;  Surgeon: Jerene Bears, MD;  Location: [facility];  Service: Gastroenterology;  Laterality: N/A;  . ESOPHAGOGASTRODUODENOSCOPY  01/28/11   Normal  . FOOT SURGERY Right age 69  . MOHS SURGERY Left 11/2013   left ankle parakerotosis   . PERCUTANEOUS LIVER BIOPSY  2007 and 2008  . PILONIDAL CYST EXCISION  age 72  . PLACEMENT OF SETON N/A 11/06/2015   Procedure: PLACEMENT OF SETON;  Surgeon: [name], MD;  Location: [facility];  Service: General;  Laterality: N/A;  . UPPER GASTROINTESTINAL ENDOSCOPY     Social History   Social History Narrative   Single, history of substance abuse in recovery   Previously occupied on medical disability   1 child    Elderly parents involved and he helps them   1 brother in [place]   family history includes Allergies in his father; Breast cancer in his mother; Clotting disorder in his father; Heart disease in his father; Stomach cancer in his mother; Thyroid cancer in his father.   Review of Systems   Objective:   Physical Exam BP 100/68   Pulse 96   Ht 6' 1.25" (1.861 m)   Wt 170 lb  9.6 oz (77.4 kg)   BMI 22.35 kg/m  [name] is in no acute distress The eyes are anicteric The lungs are clear The heart sounds are normal The abdomen is soft with some mild periumbilical tenderness without organomegaly or mass no evidence of ascites and no bruits bowel sounds are present Rectal exam reveals a fistula on the left posterior aspect with a flap of skin/tag there, some erythema and induration but I cannot express any purulent material or bloody fluid.  Digital rectal exam is attempted but he has a significant anal stenosis he may have a persistent fissure there.  It was too tender.  Stool that is seen is liquidy and brown.  He is alert and oriented x3, his affect seems a little flat but not much different than what is typical for him.  He is not suicidal and other than the connection of medications to signs and symptoms of his illness his thought process appears intact.

## 2018-04-25 NOTE — Assessment & Plan Note (Addendum)
He has stopped his medications.  He is not suicidal, he is having anxiety component and I think he is depressed and I wonder if that is not driving a lot of this.  While I do not think he is psychotic his aberrant thought processes and associations of medications with signs and symptoms have resurfaced.  He may need psychiatric help if I can get him there.  I do not think he is necessarily a significant threat to himself or anything like that now but will monitor.

## 2018-04-25 NOTE — Assessment & Plan Note (Addendum)
His Crohn's disease or his depression or other causes are possible.  I tried to get him to resume his Remeron but he will not.  It would help his appetite. We will see what the CT scan tells [place].  Could need repeat endoscopic evaluation.

## 2018-04-25 NOTE — Patient Instructions (Addendum)
  Your provider has requested that you go to the basement level for lab work before leaving today. Press "B" on the elevator. The lab is located at the first door on the left as you exit the elevator.   You have been scheduled for a CT scan of the abdomen and pelvis at [facility] ([address]---this is in the same building as [facility]).   You are scheduled on 04/26/2018 at 2:00pm. You should arrive 15 minutes prior to your appointment time for registration. Please follow the written instructions below on the day of your exam:  WARNING: IF YOU ARE ALLERGIC TO IODINE/X-RAY DYE, PLEASE NOTIFY RADIOLOGY IMMEDIATELY AT [phone]! YOU WILL BE GIVEN A 13 HOUR PREMEDICATION PREP.  1) Do not eat or drink anything after 10:00AM (4 hours prior to your test) 2) You have been given 2 bottles of oral contrast to drink. The solution may taste better if refrigerated, but do NOT add ice or any other liquid to this solution. Shake well before drinking.    Drink 1 bottle of contrast @ 12:00pm (2 hours prior to your exam)  Drink 1 bottle of contrast @ 1:00pm (1 hour prior to your exam)  You may take any medications as prescribed with a small amount of water, if necessary. If you take any of the following medications: METFORMIN, GLUCOPHAGE, GLUCOVANCE, AVANDAMET, RIOMET, FORTAMET, [place] MET, JANUMET, GLUMETZA or METAGLIP, you MAY be asked to HOLD this medication 48 hours AFTER the exam.  The purpose of you drinking the oral contrast is to aid in the visualization of your intestinal tract. The contrast solution may cause some diarrhea. Depending on your individual set of symptoms, you may also receive an intravenous injection of x-ray contrast/dye. Plan on being at [facility] for 30 minutes or longer, depending on the type of exam you are having performed.  This test typically takes 30-45 minutes to complete.  If you have any questions regarding your exam or if you need to  reschedule, you may call the CT department at [phone] between the hours of 8:00 am and 5:00 pm, Monday-Friday.  ________________________________________________________________________  I appreciate the opportunity to care for you. Silvano Rusk, MD, [facility]

## 2018-04-26 ENCOUNTER — Ambulatory Visit (INDEPENDENT_AMBULATORY_CARE_PROVIDER_SITE_OTHER)
Admission: RE | Admit: 2018-04-26 | Discharge: 2018-04-26 | Disposition: A | Discharge status: Home / Self Care | Payer: Medicare Other | Source: Ambulatory Visit | Attending: Internal Medicine | Admitting: Internal Medicine

## 2018-04-26 DIAGNOSIS — R109 Unspecified abdominal pain: Secondary | ICD-10-CM | POA: Diagnosis not present

## 2018-04-26 DIAGNOSIS — R634 Abnormal weight loss: Secondary | ICD-10-CM

## 2018-04-26 DIAGNOSIS — K50813 Crohn's disease of both small and large intestine with fistula: Secondary | ICD-10-CM | POA: Diagnosis not present

## 2018-04-26 MED ORDER — IOPAMIDOL (ISOVUE-300) INJECTION 61%
100.0000 mL | Freq: Once | INTRAVENOUS | Status: AC | PRN
  Administered 2018-04-26: 100 mL via INTRAVENOUS

## 2018-04-27 ENCOUNTER — Other Ambulatory Visit: Payer: Self-pay

## 2018-04-27 DIAGNOSIS — K754 Autoimmune hepatitis: Secondary | ICD-10-CM

## 2018-04-27 DIAGNOSIS — K7460 Unspecified cirrhosis of liver: Secondary | ICD-10-CM

## 2018-04-27 DIAGNOSIS — R932 Abnormal findings on diagnostic imaging of liver and biliary tract: Secondary | ICD-10-CM

## 2018-04-27 NOTE — Progress Notes (Signed)
I am sending a My Chart message that we will schedule an MRCP w/ and w/o contrast - please contact him and set this up  Dxs   intrahepatic bile duct dilation right lobe (abnormal CT liver is ok to use) Primary sclerosing cholangitis Cirrhosis

## 2018-05-02 ENCOUNTER — Telehealth: Payer: Self-pay

## 2018-05-02 ENCOUNTER — Ambulatory Visit (HOSPITAL_COMMUNITY): Payer: Medicare Other

## 2018-05-02 MED ORDER — AMBULATORY NON FORMULARY MEDICATION: 3 refills | Status: DC

## 2018-05-02 NOTE — Telephone Encounter (Signed)
-----   Message from [name], MD sent at 05/02/2018  1:17 PM EST ----- Regarding: diltiazem Rx 2 % diltiazem cream to into rectum bid  # 30 g 3 refills to [facility]  Let him know when you send it in

## 2018-05-02 NOTE — Telephone Encounter (Signed)
Patient informed that dilitazem cream rx faxed to [facility] as requested.

## 2018-05-03 ENCOUNTER — Telehealth: Payer: Self-pay | Admitting: Internal Medicine

## 2018-05-03 NOTE — Telephone Encounter (Signed)
[name] see message before I cancel it, do you want to talk to him?

## 2018-05-03 NOTE — Telephone Encounter (Signed)
The more I think about it he is having some abnormal thought processes (? Psychosis) and I suggest he go to ED at [facility])

## 2018-05-03 NOTE — Telephone Encounter (Signed)
I spoke to [place] and I explained topical treatment for anxiety not appropriate. He is not suicidal. He has a call into to Dr. [name] I called Dr. [name] and he had received similar request re: topical anxiety/psych meds.  Dr. [name] will get him an appointment this week and try to get [facility] psych help he needs.

## 2018-05-03 NOTE — Telephone Encounter (Signed)
Pt requested to cancel MRI scheduled at [facility] on Friday 3.6.20.  Pt did not want to resched.

## 2018-05-03 NOTE — Telephone Encounter (Signed)
He is having anxiety problems  - pretty bad - he messaged me  Please call him  He either needs to go see his PCP for help or go to ED  I think this is what is driving most of his problems right now  Cancel the MR for now  Tell him to update me  If he is at all suicidal or thinking of harming himself go to ED

## 2018-05-03 NOTE — Telephone Encounter (Signed)
patient notified of new recommendations. .  Patient relays he does not want to go to the ED for eval.  [name] wants him to see his PCP if he will not go to the ED.  He verbalized understanding.  He asks if [name] will prescribe a compounded xanax or Remeron.  Patient notified that [name] does not want to be the one to tx his anxiety and that he will need eval in ED or with his PCP.  He verbalized understanding. MRI cancelled.

## 2018-05-03 NOTE — Telephone Encounter (Signed)
Left message for patient to call back  

## 2018-05-04 DIAGNOSIS — F411 Generalized anxiety disorder: Secondary | ICD-10-CM | POA: Diagnosis not present

## 2018-05-04 DIAGNOSIS — R634 Abnormal weight loss: Secondary | ICD-10-CM | POA: Diagnosis not present

## 2018-05-04 DIAGNOSIS — F322 Major depressive disorder, single episode, severe without psychotic features: Secondary | ICD-10-CM | POA: Diagnosis not present

## 2018-05-04 DIAGNOSIS — M549 Dorsalgia, unspecified: Secondary | ICD-10-CM | POA: Diagnosis not present

## 2018-05-05 ENCOUNTER — Other Ambulatory Visit (HOSPITAL_COMMUNITY): Payer: Medicare Other

## 2018-05-05 ENCOUNTER — Encounter (HOSPITAL_COMMUNITY): Payer: Self-pay

## 2018-05-18 ENCOUNTER — Other Ambulatory Visit (INDEPENDENT_AMBULATORY_CARE_PROVIDER_SITE_OTHER): Payer: Medicare Other

## 2018-05-18 ENCOUNTER — Other Ambulatory Visit: Payer: Self-pay | Admitting: Internal Medicine

## 2018-05-18 DIAGNOSIS — E440 Moderate protein-calorie malnutrition: Secondary | ICD-10-CM | POA: Diagnosis not present

## 2018-05-18 DIAGNOSIS — E559 Vitamin D deficiency, unspecified: Secondary | ICD-10-CM

## 2018-05-18 LAB — BASIC METABOLIC PANEL
BUN: 11 mg/dL (ref 6–23)
CALCIUM: 7.9 mg/dL — AB (ref 8.4–10.5)
CO2: 31 mEq/L (ref 19–32)
CREATININE: 1 mg/dL (ref 0.40–1.50)
Chloride: 101 mEq/L (ref 96–112)
GFR: 77.57 mL/min (ref >60.00)
Glucose, Bld: 101 mg/dL — ABNORMAL HIGH (ref 70–99)
Potassium: 3.4 mEq/L — ABNORMAL LOW (ref 3.5–5.1)
Sodium: 138 mEq/L (ref 135–145)

## 2018-05-18 LAB — VITAMIN D 25 HYDROXY (VIT D DEFICIENCY, FRACTURES): VITD: 30.15 ng/mL (ref 30.00–100.00)

## 2018-05-18 NOTE — Telephone Encounter (Signed)
Spoke to [name]  Very anxious about possible vitamin deficiencies which are possible in his case  Labs ordered

## 2018-05-19 NOTE — Progress Notes (Signed)
Mild abnormalities Other labs pending

## 2018-05-23 ENCOUNTER — Other Ambulatory Visit: Payer: Self-pay | Admitting: Internal Medicine

## 2018-05-23 ENCOUNTER — Encounter: Payer: Self-pay | Admitting: Internal Medicine

## 2018-05-23 DIAGNOSIS — E54 Ascorbic acid deficiency: Secondary | ICD-10-CM

## 2018-05-23 DIAGNOSIS — E509 Vitamin A deficiency, unspecified: Secondary | ICD-10-CM

## 2018-05-23 HISTORY — DX: Vitamin a deficiency, unspecified: E50.9

## 2018-05-23 HISTORY — DX: Ascorbic acid deficiency: E54

## 2018-05-23 LAB — VITAMIN C: Vitamin C: 0.1 mg/dL — ABNORMAL LOW (ref 0.2–2.1)

## 2018-05-23 LAB — VITAMIN E
Gamma-Tocopherol (Vit E): <1 mg/L (ref <=4.3)
Vitamin E (Alpha Tocopherol): 13.4 mg/L (ref 5.7–19.9)

## 2018-05-23 LAB — VITAMIN A: Vitamin A (Retinoic Acid): 21 ug/dL — ABNORMAL LOW (ref 38–98)

## 2018-05-23 NOTE — Progress Notes (Signed)
[name],  Vitamin A and C are low  Hard to know how much of a problem this is for you but here is what I recommend.  Vitamin A - get over the counter and take 100,ooo units daily for 3 days, then 50,0000 units daily x 14 days. Then take 10,000 units every other day  Vitamin C take 1000 units daily  Hope this helps [name], MD, [facility]

## 2018-05-28 ENCOUNTER — Encounter (HOSPITAL_COMMUNITY): Payer: Self-pay

## 2018-05-28 ENCOUNTER — Other Ambulatory Visit: Payer: Self-pay

## 2018-05-28 ENCOUNTER — Inpatient Hospital Stay (HOSPITAL_COMMUNITY)
Admission: EM | Admit: 2018-05-28 | Discharge: 2018-06-01 | DRG: 386 | Disposition: A | Discharge status: Home / Self Care | Payer: Medicare Other | Attending: Internal Medicine | Admitting: Internal Medicine

## 2018-05-28 DIAGNOSIS — F332 Major depressive disorder, recurrent severe without psychotic features: Secondary | ICD-10-CM | POA: Diagnosis present

## 2018-05-28 DIAGNOSIS — K754 Autoimmune hepatitis: Secondary | ICD-10-CM | POA: Diagnosis present

## 2018-05-28 DIAGNOSIS — R45851 Suicidal ideations: Secondary | ICD-10-CM | POA: Diagnosis present

## 2018-05-28 DIAGNOSIS — K50918 Crohn's disease, unspecified, with other complication: Secondary | ICD-10-CM

## 2018-05-28 DIAGNOSIS — Z66 Do not resuscitate: Secondary | ICD-10-CM | POA: Diagnosis present

## 2018-05-28 DIAGNOSIS — Z888 Allergy status to other drugs, medicaments and biological substances status: Secondary | ICD-10-CM

## 2018-05-28 DIAGNOSIS — Z961 Presence of intraocular lens: Secondary | ICD-10-CM | POA: Diagnosis present

## 2018-05-28 DIAGNOSIS — R1084 Generalized abdominal pain: Secondary | ICD-10-CM | POA: Diagnosis not present

## 2018-05-28 DIAGNOSIS — D638 Anemia in other chronic diseases classified elsewhere: Secondary | ICD-10-CM | POA: Diagnosis present

## 2018-05-28 DIAGNOSIS — Z79899 Other long term (current) drug therapy: Secondary | ICD-10-CM

## 2018-05-28 DIAGNOSIS — K635 Polyp of colon: Secondary | ICD-10-CM | POA: Diagnosis present

## 2018-05-28 DIAGNOSIS — Z9841 Cataract extraction status, right eye: Secondary | ICD-10-CM | POA: Diagnosis not present

## 2018-05-28 DIAGNOSIS — K50813 Crohn's disease of both small and large intestine with fistula: Principal | ICD-10-CM | POA: Diagnosis present

## 2018-05-28 DIAGNOSIS — L111 Transient acantholytic dermatosis [Grover]: Secondary | ICD-10-CM | POA: Diagnosis present

## 2018-05-28 DIAGNOSIS — R197 Diarrhea, unspecified: Secondary | ICD-10-CM | POA: Diagnosis not present

## 2018-05-28 DIAGNOSIS — R932 Abnormal findings on diagnostic imaging of liver and biliary tract: Secondary | ICD-10-CM | POA: Diagnosis not present

## 2018-05-28 DIAGNOSIS — Z7952 Long term (current) use of systemic steroids: Secondary | ICD-10-CM

## 2018-05-28 DIAGNOSIS — Z915 Personal history of self-harm: Secondary | ICD-10-CM

## 2018-05-28 DIAGNOSIS — K50113 Crohn's disease of large intestine with fistula: Secondary | ICD-10-CM | POA: Diagnosis not present

## 2018-05-28 DIAGNOSIS — I10 Essential (primary) hypertension: Secondary | ICD-10-CM | POA: Diagnosis present

## 2018-05-28 DIAGNOSIS — K602 Anal fissure, unspecified: Secondary | ICD-10-CM | POA: Diagnosis not present

## 2018-05-28 DIAGNOSIS — K50919 Crohn's disease, unspecified, with unspecified complications: Secondary | ICD-10-CM | POA: Diagnosis not present

## 2018-05-28 DIAGNOSIS — F331 Major depressive disorder, recurrent, moderate: Secondary | ICD-10-CM | POA: Diagnosis not present

## 2018-05-28 DIAGNOSIS — E876 Hypokalemia: Secondary | ICD-10-CM | POA: Diagnosis present

## 2018-05-28 DIAGNOSIS — Z9119 Patient's noncompliance with other medical treatment and regimen: Secondary | ICD-10-CM

## 2018-05-28 DIAGNOSIS — Z85828 Personal history of other malignant neoplasm of skin: Secondary | ICD-10-CM | POA: Diagnosis not present

## 2018-05-28 DIAGNOSIS — Z808 Family history of malignant neoplasm of other organs or systems: Secondary | ICD-10-CM

## 2018-05-28 DIAGNOSIS — E162 Hypoglycemia, unspecified: Secondary | ICD-10-CM | POA: Diagnosis not present

## 2018-05-28 DIAGNOSIS — Z803 Family history of malignant neoplasm of breast: Secondary | ICD-10-CM

## 2018-05-28 DIAGNOSIS — K838 Other specified diseases of biliary tract: Secondary | ICD-10-CM

## 2018-05-28 DIAGNOSIS — Z91048 Other nonmedicinal substance allergy status: Secondary | ICD-10-CM

## 2018-05-28 DIAGNOSIS — G894 Chronic pain syndrome: Secondary | ICD-10-CM | POA: Diagnosis present

## 2018-05-28 DIAGNOSIS — K529 Noninfective gastroenteritis and colitis, unspecified: Secondary | ICD-10-CM | POA: Diagnosis not present

## 2018-05-28 DIAGNOSIS — Z8249 Family history of ischemic heart disease and other diseases of the circulatory system: Secondary | ICD-10-CM

## 2018-05-28 DIAGNOSIS — Z800 Family history of malignant neoplasm of digestive organs: Secondary | ICD-10-CM

## 2018-05-28 DIAGNOSIS — K5090 Crohn's disease, unspecified, without complications: Secondary | ICD-10-CM | POA: Diagnosis not present

## 2018-05-28 DIAGNOSIS — E559 Vitamin D deficiency, unspecified: Secondary | ICD-10-CM | POA: Diagnosis present

## 2018-05-28 DIAGNOSIS — F064 Anxiety disorder due to known physiological condition: Secondary | ICD-10-CM | POA: Diagnosis present

## 2018-05-28 DIAGNOSIS — E509 Vitamin A deficiency, unspecified: Secondary | ICD-10-CM | POA: Diagnosis present

## 2018-05-28 DIAGNOSIS — R52 Pain, unspecified: Secondary | ICD-10-CM | POA: Diagnosis not present

## 2018-05-28 DIAGNOSIS — E54 Ascorbic acid deficiency: Secondary | ICD-10-CM | POA: Diagnosis present

## 2018-05-28 DIAGNOSIS — Z87891 Personal history of nicotine dependence: Secondary | ICD-10-CM

## 2018-05-28 DIAGNOSIS — K5080 Crohn's disease of both small and large intestine without complications: Secondary | ICD-10-CM | POA: Diagnosis not present

## 2018-05-28 DIAGNOSIS — Z9842 Cataract extraction status, left eye: Secondary | ICD-10-CM | POA: Diagnosis not present

## 2018-05-28 DIAGNOSIS — M7989 Other specified soft tissue disorders: Secondary | ICD-10-CM | POA: Diagnosis not present

## 2018-05-28 LAB — URINALYSIS, ROUTINE W REFLEX MICROSCOPIC
Bilirubin Urine: NEGATIVE
Glucose, UA: NEGATIVE mg/dL
Hgb urine dipstick: NEGATIVE
Ketones, ur: 5 mg/dL — AB
Leukocytes,Ua: NEGATIVE
Nitrite: NEGATIVE
PROTEIN: NEGATIVE mg/dL
Specific Gravity, Urine: 1.005 (ref 1.005–1.030)
pH: 6 (ref 5.0–8.0)

## 2018-05-28 LAB — COMPREHENSIVE METABOLIC PANEL
ALBUMIN: 2.2 g/dL — AB (ref 3.5–5.0)
ALT: 19 U/L (ref 0–44)
AST: 22 U/L (ref 15–41)
Alkaline Phosphatase: 121 U/L (ref 38–126)
Anion gap: 8 (ref 5–15)
BILIRUBIN TOTAL: 0.7 mg/dL (ref 0.3–1.2)
CO2: 24 mmol/L (ref 22–32)
Calcium: 7.5 mg/dL — ABNORMAL LOW (ref 8.9–10.3)
Chloride: 101 mmol/L (ref 98–111)
Creatinine, Ser: 0.77 mg/dL (ref 0.61–1.24)
GFR calc Af Amer: >60 mL/min (ref >60)
GFR calc non Af Amer: >60 mL/min (ref >60)
Glucose, Bld: 65 mg/dL — ABNORMAL LOW (ref 70–99)
Potassium: 2.6 mmol/L — CL (ref 3.5–5.1)
Sodium: 133 mmol/L — ABNORMAL LOW (ref 135–145)
Total Protein: 4.7 g/dL — ABNORMAL LOW (ref 6.5–8.1)

## 2018-05-28 LAB — CBC WITH DIFFERENTIAL/PLATELET
Abs Immature Granulocytes: 0.04 10*3/uL (ref 0.00–0.07)
Basophils Absolute: 0 10*3/uL (ref 0.0–0.1)
Basophils Relative: 1 %
Eosinophils Absolute: 0.2 10*3/uL (ref 0.0–0.5)
Eosinophils Relative: 2 %
HCT: 37.9 % — ABNORMAL LOW (ref 39.0–52.0)
Hemoglobin: 12.3 g/dL — ABNORMAL LOW (ref 13.0–17.0)
Immature Granulocytes: 1 %
LYMPHS PCT: 22 %
Lymphs Abs: 1.5 10*3/uL (ref 0.7–4.0)
MCH: 27.7 pg (ref 26.0–34.0)
MCHC: 32.5 g/dL (ref 30.0–36.0)
MCV: 85.4 fL (ref 80.0–100.0)
Monocytes Absolute: 1.3 10*3/uL — ABNORMAL HIGH (ref 0.1–1.0)
Monocytes Relative: 19 %
NRBC: 0 % (ref 0.0–0.2)
Neutro Abs: 3.9 10*3/uL (ref 1.7–7.7)
Neutrophils Relative %: 55 %
Platelets: 256 10*3/uL (ref 150–400)
RBC: 4.44 MIL/uL (ref 4.22–5.81)
RDW: 14.3 % (ref 11.5–15.5)
WBC: 6.9 10*3/uL (ref 4.0–10.5)

## 2018-05-28 LAB — PHOSPHORUS: Phosphorus: 3 mg/dL (ref 2.5–4.6)

## 2018-05-28 LAB — PROTIME-INR
INR: 1.1 (ref 0.8–1.2)
Prothrombin Time: 14.4 seconds (ref 11.4–15.2)

## 2018-05-28 LAB — C DIFFICILE QUICK SCREEN W PCR REFLEX
C Diff antigen: NEGATIVE
C Diff toxin: POSITIVE — AB

## 2018-05-28 LAB — LACTIC ACID, PLASMA
Lactic Acid, Venous: 1 mmol/L (ref 0.5–1.9)
Lactic Acid, Venous: 1 mmol/L (ref 0.5–1.9)

## 2018-05-28 LAB — TSH: TSH: 4.496 u[IU]/mL (ref 0.350–4.500)

## 2018-05-28 LAB — C-REACTIVE PROTEIN: CRP: 11.4 mg/dL — ABNORMAL HIGH (ref <1.0)

## 2018-05-28 LAB — CORTISOL: Cortisol, Plasma: 4.5 ug/dL

## 2018-05-28 LAB — SEDIMENTATION RATE: Sed Rate: 50 mm/hr — ABNORMAL HIGH (ref 0–16)

## 2018-05-28 LAB — LIPASE, BLOOD: Lipase: 18 U/L (ref 11–51)

## 2018-05-28 LAB — VITAMIN B12: Vitamin B-12: 799 pg/mL (ref 180–914)

## 2018-05-28 LAB — MAGNESIUM: Magnesium: 1.7 mg/dL (ref 1.7–2.4)

## 2018-05-28 MED ORDER — VANCOMYCIN 50 MG/ML ORAL SOLUTION
125.0000 mg | Freq: Four times a day (QID) | ORAL | Status: DC
  Administered 2018-05-28 – 2018-05-30 (×9): 125 mg via ORAL
  Filled 2018-05-28 (×16): qty 2.5

## 2018-05-28 MED ORDER — LORAZEPAM 2 MG/ML IJ SOLN
1.0000 mg | Freq: Once | INTRAMUSCULAR | Status: AC
  Administered 2018-05-28: 1 mg via INTRAVENOUS
  Filled 2018-05-28: qty 1

## 2018-05-28 MED ORDER — LACTATED RINGERS IV SOLN
INTRAVENOUS | Status: DC
  Administered 2018-05-28 – 2018-05-29 (×3): via INTRAVENOUS

## 2018-05-28 MED ORDER — POTASSIUM CHLORIDE 10 MEQ/100ML IV SOLN
10.0000 meq | Freq: Once | INTRAVENOUS | Status: AC
  Administered 2018-05-28: 10 meq via INTRAVENOUS
  Filled 2018-05-28: qty 100

## 2018-05-28 MED ORDER — THIAMINE HCL 100 MG/ML IJ SOLN
Freq: Once | INTRAVENOUS | Status: AC
  Administered 2018-05-28: 10:00:00 via INTRAVENOUS
  Filled 2018-05-28: qty 1000

## 2018-05-28 MED ORDER — CLONAZEPAM 0.5 MG PO TABS
0.5000 mg | ORAL_TABLET | Freq: Every evening | ORAL | Status: DC | PRN
  Administered 2018-05-28 – 2018-05-31 (×4): 0.5 mg via ORAL
  Filled 2018-05-28 (×4): qty 1

## 2018-05-28 MED ORDER — LACTATED RINGERS IV BOLUS
1000.0000 mL | Freq: Once | INTRAVENOUS | Status: AC
  Administered 2018-05-28: 1000 mL via INTRAVENOUS

## 2018-05-28 MED ORDER — POTASSIUM CHLORIDE CRYS ER 20 MEQ PO TBCR
40.0000 meq | EXTENDED_RELEASE_TABLET | Freq: Once | ORAL | Status: AC
  Administered 2018-05-28: 40 meq via ORAL
  Filled 2018-05-28: qty 2

## 2018-05-28 MED ORDER — HYDROCODONE-ACETAMINOPHEN 5-325 MG PO TABS
1.0000 | ORAL_TABLET | Freq: Four times a day (QID) | ORAL | Status: DC | PRN
  Administered 2018-05-31: 2 via ORAL
  Filled 2018-05-28: qty 2

## 2018-05-28 MED ORDER — FLUTICASONE PROPIONATE 50 MCG/ACT NA SUSP: 2.0000 | Freq: Every morning | NASAL | Status: DC

## 2018-05-28 MED ORDER — ACETAMINOPHEN 325 MG PO TABS: 650.0000 mg | ORAL_TABLET | Freq: Four times a day (QID) | ORAL | Status: DC | PRN

## 2018-05-28 MED ORDER — ACETAMINOPHEN 650 MG RE SUPP: 650.0000 mg | Freq: Four times a day (QID) | RECTAL | Status: DC | PRN

## 2018-05-28 MED ORDER — ONDANSETRON HCL 4 MG/2ML IJ SOLN: 4.0000 mg | Freq: Four times a day (QID) | INTRAMUSCULAR | Status: DC | PRN

## 2018-05-28 MED ORDER — ONDANSETRON HCL 4 MG PO TABS: 4.0000 mg | ORAL_TABLET | Freq: Four times a day (QID) | ORAL | Status: DC | PRN

## 2018-05-28 MED ORDER — MORPHINE SULFATE (PF) 2 MG/ML IV SOLN
2.0000 mg | INTRAVENOUS | Status: DC | PRN
  Administered 2018-05-28 – 2018-05-30 (×2): 2 mg via INTRAVENOUS
  Filled 2018-05-28 (×2): qty 1

## 2018-05-28 NOTE — ED Triage Notes (Signed)
Chron's patient, abdominal cramping and bloody stools for about 3 days. Pt has had episodes like this in the past. Pt also c/o a dry, red rash on bilateral lower legs.

## 2018-05-28 NOTE — ED Notes (Signed)
ED TO INPATIENT HANDOFF REPORT  ED Nurse Name and Phone #: [place], [phone]  S Name/Age/Gender [name] 53 y.o. male Room/Bed: WA05/WA05  Code Status   Code Status: Prior  Home/SNF/Other Home Patient oriented to: self Is this baseline? Yes   Triage Complete: Triage complete  Chief Complaint rectal bleeding  Triage Note Chron's patient, abdominal cramping and bloody stools for about 3 days. Pt has had episodes like this in the past. Pt also c/o a dry, red rash on bilateral lower legs.   Allergies Allergies  Allergen Reactions  . Asacol [Mesalamine] Diarrhea    abd pain  . Flagyl [Metronidazole] Diarrhea    abd pain  . Azathioprine Diarrhea    abd pain  . Gluten Meal Diarrhea  . Mycophenolate Mofetil Diarrhea    abd pain  . Other     NUTS; constipation  . Wheat Bran Diarrhea    Constipation; flatulence; abd pain  . Tape Itching and Rash    Please use "paper" tape    Level of Care/Admitting Diagnosis ED Disposition    ED Disposition Condition [facility] Area: [place] [[id number]]  Level of Care: Med-Surg [16]  Diagnosis: Crohn's colitis, with fistula [facility]) [[id number]]  Admitting Physician: Karmen Bongo [[id number]]  Attending Physician: Karmen Bongo [[id number]]  Estimated length of stay: 3 - 4 days  Certification:: I certify this patient will need inpatient services for at least 2 midnights  PT Class (Do Not Modify): Inpatient [101]  PT Acc Code (Do Not Modify): Private [1]       B Medical/Surgery History Past Medical History:  Diagnosis Date  . Allergy   . Anal fistula   . Anxiety   . 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 Autoimmune hepatitis ([place]) 01/19/2013  . Cancer of skin of neck   . Cataract   . Chronic mesenteric ischemia ([place])   . Chronic pain syndrome 07/09/2016  . Crohn's disease of small and large intestines ([place])    followed by dr Glendell Docker gessner  . Dairy product intolerance   . Diarrhea,  functional   . Family history of adverse reaction to anesthesia   . Grover's disease    transient acantholytic dermatosis  . History of alcohol abuse   . History of basal cell carcinoma excision    2013 left leg  . History of Clostridium difficile    10/ 2014  . History of multiple concussions    x6   last one Jan 2017 per pt--  no residual  . History of substance abuse ([place])    quit 1997 per pt  . History of suicide attempt    05-18-2012  overdose /  failure to thrive  . Hypercholesterolemia   . Iron deficiency anemia due to chronic blood loss   . Major depression, recurrent, chronic ([place])   . Osteopenia   . Personal history of adenomatous colonic polyps 12/2010, 03/2012   12/2010 - 8 mm serrated adenoma of rectum  . Portal vein thrombosis 03/21/2015   right  . Primary sclerosing cholangitis    ? hepatitis overlap - liver bx x 2 and MRCP  . Seasonal allergies   . Substance abuse ([place]) 1997   Alcohol  . Vitamin A deficiency 05/23/2018  . Vitamin C deficiency 05/23/2018   Past Surgical History:  Procedure Laterality Date  . ABDOMINAL AORTAGRAM N/A 03/29/2012   Procedure: ABDOMINAL Maxcine Ham;  Surgeon: Serafina Mitchell, MD;  Location: [facility]  CATH LAB;  Service: Cardiovascular;  Laterality: N/A;  . ADENOIDECTOMY  age 58  . CATARACT EXTRACTION W/ INTRAOCULAR LENS  IMPLANT, BILATERAL  2009  . COLONOSCOPY  2001, 05/02/2003, 01/28/11   2012: Right colon Crohn's, rectal polyp  . COLONOSCOPY  03/31/2012   Procedure: COLONOSCOPY;  Surgeon: Jerene Bears, MD;  Location: [place];  Service: Gastroenterology;  Laterality: N/A;  . ESOPHAGOGASTRODUODENOSCOPY  01/28/11   Normal  . FOOT SURGERY Right age 20  . MOHS SURGERY Left 11/2013   left ankle parakerotosis   . PERCUTANEOUS LIVER BIOPSY  2007 and 2008  . PILONIDAL CYST EXCISION  age 36  . PLACEMENT OF SETON N/A 11/06/2015   Procedure: PLACEMENT OF SETON;  Surgeon: [name], MD;  Location: [facility];  Service: General;   Laterality: N/A;  . UPPER GASTROINTESTINAL ENDOSCOPY       A IV Location/Drains/Wounds Patient Lines/Drains/Airways Status   Active Line/Drains/Airways    Name:   Placement date:   Placement time:   Site:   Days:   Peripheral IV 05/28/18 Left Antecubital   05/28/18    0929    Antecubital   less than 1   Incision (Closed) 11/06/15 Rectum   11/06/15    0931     934   Wound 04/02/12 Skin tear Sacrum   04/02/12    1611    Sacrum   2247          Intake/Output Last 24 hours  Intake/Output Summary (Last 24 hours) at 05/28/2018 1353 Last data filed at 05/28/2018 1117 Gross per 24 hour  Intake 999 ml  Output -  Net 999 ml    Labs/Imaging Results for orders placed or performed during the hospital encounter of 05/28/18 (from the past 48 hour(s))  Urinalysis, Routine w reflex microscopic     Status: Abnormal   Collection Time: 05/28/18  9:35 AM  Result Value Ref Range   Color, Urine YELLOW YELLOW   APPearance CLEAR CLEAR   Specific Gravity, Urine 1.005 1.005 - 1.030   pH 6.0 5.0 - 8.0   Glucose, UA NEGATIVE NEGATIVE mg/dL   Hgb urine dipstick NEGATIVE NEGATIVE   Bilirubin Urine NEGATIVE NEGATIVE   Ketones, ur 5 (A) NEGATIVE mg/dL   Protein, ur NEGATIVE NEGATIVE mg/dL   Nitrite NEGATIVE NEGATIVE   Leukocytes,Ua NEGATIVE NEGATIVE    Comment: Performed at [facility], [place] [address], [place], [place] [address]  Comprehensive metabolic panel     Status: Abnormal   Collection Time: 05/28/18 10:08 AM  Result Value Ref Range   Sodium 133 (L) 135 - 145 mmol/L   Potassium 2.6 (LL) 3.5 - 5.1 mmol/L    Comment: CRITICAL RESULT CALLED TO, READ BACK BY AND VERIFIED WITH: SMITH,T RN [id number] C[place],N    Chloride 101 98 - 111 mmol/L   CO2 24 22 - 32 mmol/L   Glucose, Bld 65 (L) 70 - 99 mg/dL   BUN <5 (L) 6 - 20 mg/dL   Creatinine, Ser 0.77 0.61 - 1.24 mg/dL   Calcium 7.5 (L) 8.9 - 10.3 mg/dL   Total Protein 4.7 (L) 6.5 - 8.1 g/dL   Albumin 2.2 (L) 3.5 - 5.0  g/dL   AST 22 15 - 41 U/L   ALT 19 0 - 44 U/L   Alkaline Phosphatase 121 38 - 126 U/L   Total Bilirubin 0.7 0.3 - 1.2 mg/dL   GFR calc non Af Amer >60 >60 mL/min   GFR  calc Af Amer >60 >60 mL/min   Anion gap 8 5 - 15    Comment: Performed at [facility], [place] [address] [place], [place] [address]  Lipase, blood     Status: None   Collection Time: 05/28/18 10:08 AM  Result Value Ref Range   Lipase 18 11 - 51 U/L    Comment: Performed at [facility], [place] [address], [place], [place] [address]  CBC with Differential     Status: Abnormal   Collection Time: 05/28/18 10:08 AM  Result Value Ref Range   WBC 6.9 4.0 - 10.5 K/uL   RBC 4.44 4.22 - 5.81 MIL/uL   Hemoglobin 12.3 (L) 13.0 - 17.0 g/dL   HCT 37.9 (L) 39.0 - 52.0 %   MCV 85.4 80.0 - 100.0 fL   MCH 27.7 26.0 - 34.0 pg   MCHC 32.5 30.0 - 36.0 g/dL   RDW 14.3 11.5 - 15.5 %   Platelets 256 150 - 400 K/uL   nRBC 0.0 0.0 - 0.2 %   Neutrophils Relative % 55 %   Neutro Abs 3.9 1.7 - 7.7 K/uL   Lymphocytes Relative 22 %   Lymphs Abs 1.5 0.7 - 4.0 K/uL   Monocytes Relative 19 %   Monocytes Absolute 1.3 (H) 0.1 - 1.0 K/uL   Eosinophils Relative 2 %   Eosinophils Absolute 0.2 0.0 - 0.5 K/uL   Basophils Relative 1 %   Basophils Absolute 0.0 0.0 - 0.1 K/uL   Immature Granulocytes 1 %   Abs Immature Granulocytes 0.04 0.00 - 0.07 K/uL    Comment: Performed at [facility], [place] [address], [place], [place] [address]  Protime-INR     Status: None   Collection Time: 05/28/18 10:08 AM  Result Value Ref Range   Prothrombin Time 14.4 11.4 - 15.2 seconds   INR 1.1 0.8 - 1.2    Comment: (NOTE) INR goal varies based on device and disease states. Performed at [facility], [place] [address] [place] [address] [place], [place] [address]   Sedimentation rate     Status: Abnormal   Collection Time: 05/28/18 10:08 AM  Result Value Ref Range   Sed Rate 50 (H) 0 - 16 mm/hr    Comment:  Performed at [facility], [place] [address], [name], [place] [address]  C-reactive protein     Status: Abnormal   Collection Time: 05/28/18 10:08 AM  Result Value Ref Range   CRP 11.4 (H) <1.0 mg/dL    Comment: Performed at [facility], [place] [address], [place], [place] [address]  Cortisol     Status: None   Collection Time: 05/28/18 10:08 AM  Result Value Ref Range   Cortisol, Plasma 4.5 ug/dL    Comment: (NOTE) AM    6.7 - 22.6 ug/dL PM   <10.0       ug/dL Performed at [place] [address], [place], [place] [address]   Magnesium     Status: None   Collection Time: 05/28/18 10:08 AM  Result Value Ref Range   Magnesium 1.7 1.7 - 2.4 mg/dL    Comment: Performed at [facility], [place] [address] [place],  [address]  Phosphorus     Status: None   Collection Time: 05/28/18 10:08 AM  Result Value Ref Range   Phosphorus 3.0 2.5 - 4.6 mg/dL    Comment: Performed at [facility], [place] [address]  [place], [place] [address]  Vitamin B12     Status: None   Collection Time: 05/28/18 10:08 AM  Result Value Ref Range   Vitamin B-12 799 180 - 914 pg/mL    Comment: (NOTE) This assay is not validated for testing neonatal or myeloproliferative syndrome specimens for Vitamin B12 levels. Performed at [facility], [place] [address] [place], [place] [address]   TSH     Status: None   Collection Time: 05/28/18 10:08 AM  Result Value Ref Range   TSH 4.496 0.350 - 4.500 uIU/mL    Comment: Performed by a 3rd Generation assay with a functional sensitivity of <=0.01 uIU/mL. Performed at [facility], [place] [address], [place], [place] [address]   Lactic acid, plasma     Status: None   Collection Time: 05/28/18 10:10 AM  Result Value Ref Range   Lactic Acid, Venous 1.0 0.5 - 1.9 mmol/L    Comment: Performed at [facility], [place] [address], [place], [place]  [address]  Lactic acid, plasma     Status: None   Collection Time: 05/28/18 12:10 PM  Result Value Ref Range   Lactic Acid, Venous 1.0 0.5 - 1.9 mmol/L    Comment: Performed at [facility], [place] [address], [place], [place] [address]   No results found.  Pending Labs Unresulted Labs (From admission, onward)    Start     Ordered   05/28/18 0940  Vitamin A  Once,   R     05/28/18 0939   05/28/18 0940  Vitamin B1  Once,   R     05/28/18 0939   05/28/18 0940  Vitamin B6  Once,   R     05/28/18 0939   05/28/18 0940  Vitamin C  Once,   R     05/28/18 0939   05/28/18 0940  VITAMIN D 25 Hydroxy (Vit-D Deficiency, Fractures)  Once,   R     05/28/18 0939   05/28/18 0940  Vitamin E  Once,   R     05/28/18 0939   05/28/18 0940  Vitamin K1, Serum  Once,   R     05/28/18 0939   05/28/18 0940  Calcitriol (1,25 di-OH Vit D)  Once,   R     05/28/18 0939          Vitals/Pain Today's Vitals   05/28/18 1130 05/28/18 1200 05/28/18 1230 05/28/18 1300  BP: 127/87 101/65 119/81 105/85  Pulse: 90 91 87 88  Resp:  17    Temp:  98 F (36.7 C)    TempSrc:  Oral    SpO2: 100% 99% 100% 98%  Weight:      Height:      PainSc:  2       Isolation Precautions No active isolations  Medications Medications  lactated ringers bolus 1,000 mL (0 mLs Intravenous Stopped 05/28/18 1117)  sodium chloride 0.9 % 1,000 mL with thiamine 751 mg, folic acid 1 mg, multivitamins adult 10 mL infusion ( Intravenous New Bag/Given 05/28/18 1012)  LORazepam (ATIVAN) injection 1 mg (1 mg Intravenous Given 05/28/18 1008)  potassium chloride 10 mEq in 100 mL IVPB (10 mEq Intravenous New Bag/Given 05/28/18 1224)  potassium chloride SA (K-DUR,KLOR-CON) CR tablet 40 mEq (40 mEq Oral Given 05/28/18 1224)    Mobility walks Low fall risk   Focused Assessments GI Assessment   R Recommendations: See Admitting Provider Note  Report given to:  Additional Notes:

## 2018-05-28 NOTE — ED Notes (Signed)
Urinal at bedside. Pt is aware that MD needs UA sample

## 2018-05-28 NOTE — ED Notes (Signed)
Bed: SY45 Expected date:  Expected time:  Means of arrival:  Comments: 53 yo Crohn's flare

## 2018-05-28 NOTE — Consult Note (Addendum)
Referring Provider: [facility]   Primary Care Physician:  [name], MD Primary Gastroenterologist:  Silvano Rusk, MD     Reason for Consultation:    Crohn's disease    ASSESSMENT / PLAN:     58. 53 yo male with complex GI history. He has Crohn's disease / perianal fistula (assume involvement of small bowel as well given inflammatory polyp in terminal ileum on last colonoscopy a year ago).  On Entyvio.  Recently tapered off long course of steroids.  Admitted with hypokalemia, crampy diarrhea with blood, ongoing weight loss ( ~ 30 pounds since September) -Most of these symptoms are not really acute, [name] has been evaluating as outpatient though it does sound like diarrhea has gotten acutely worse. CT scan late Feb suggested possible mild active Crohn's involving cecum.  -There has been concern that anxiety / depression could be contributing to / exacerbating symptoms. Patient recently stopped Remeron. -Given the increase in diarrhea will obtain fecal calprotectin and stool for c-diff.  Elevation in systemic inflammatory markers noted but I am holding off on steroids for now (especially in light of COVID 19).  I would like to try and reach [name] tomorrow as he knows patient extremely well and can provide valuable insight.  2. Hypokalemia, repletion in progress  3. AIH - PSC overlap. LFTS remain normal. Cirrhosis on CT scan late February but he doesn't seem to have associated portal HTN. There were some bile duct abnormalities seen on the scan.  -patient was scheduled for outpatient MRI but was unable to fast for 4 hours prior to procedure so he cancelled it.   4. Depression / anxiety. He stopped Remeron. PCP and [name] have been trying to arrange for psychiatric assistance.     HPI:      HPI: [name] is a 53 y.o. male with Crohn's disease, perirectal fistula, autoimune hepatitis / PSC overlap, hx of PVT, anemia from chronic GI blood loss.  Followed closely by [name], maintained on [place]. Recently we have been evaluating him for weight loss, rectal bleeding and abdominal pain. He struggles with significant depression / anxiety. Patient was on Remeron, stopped it -though it caused him to have abdominal pain and diarrhea. He has been frequently conversing with [name] about these symptoms, some of which are questionably related to depression / anxiety. CT scan with contrast late Feb showed ? mild active Crohn's of cecum, no other bowel inflammation described.  He recently tapered off a long course of prednisone. Completed the course 4 days ago. Thinks the rectal bleeding is actually better off steroids but now having more diarrhea. Last night he had ~ 7 episodes of diarrhea with blood in a few hour span. Typically he has several loose BMs a day but they are more spread out throughout the day generally. He has diffuse lower abdominal pain associated with bowel movements. After having a BM he applies a heating pad to the area and pain generally subsides in ~ 45 minutes. No urinary sx. He has a decent appetite but eating "too much" leads to more abdominal discomfort. No vomiting but admits to some nausea at times.   CT scan in late February also showed some bile duct changes. Plan was for MRI to better assess liver / biliary tree. This was scheduled but patient couldn't fast for the required 4 hours.  [name] in ED now with weakness, worsening diarrhea with blood. His K+ is low. White count normal. Hgb 12.3,  down from baseline of ~ 12.3. ESR 50, CRP 11. He is being admitted for further evaluation and treatment.    Past Medical History:  Diagnosis Date  . Allergy   . Anal fistula   . Anxiety   . 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 Autoimmune hepatitis ([place]) 01/19/2013  . Cancer of skin of neck   . Cataract   . Chronic mesenteric ischemia ([place])   . Chronic pain syndrome 07/09/2016  . Crohn's disease of small and large  intestines ([place])    followed by dr Glendell Docker gessner  . Dairy product intolerance   . Diarrhea, functional   . Family history of adverse reaction to anesthesia   . Grover's disease    transient acantholytic dermatosis  . History of alcohol abuse   . History of basal cell carcinoma excision    2013 left leg  . History of Clostridium difficile    10/ 2014  . History of multiple concussions    x6   last one Jan 2017 per pt--  no residual  . History of substance abuse ([place])    quit 1997 per pt  . History of suicide attempt    05-18-2012  overdose /  failure to thrive  . Hypercholesterolemia   . Iron deficiency anemia due to chronic blood loss   . Major depression, recurrent, chronic ([place])   . Osteopenia   . Personal history of adenomatous colonic polyps 12/2010, 03/2012   12/2010 - 8 mm serrated adenoma of rectum  . Portal vein thrombosis 03/21/2015   right  . Primary sclerosing cholangitis    ? hepatitis overlap - liver bx x 2 and MRCP  . Seasonal allergies   . Substance abuse ([place]) 1997   Alcohol  . Vitamin A deficiency 05/23/2018  . Vitamin C deficiency 05/23/2018    Past Surgical History:  Procedure Laterality Date  . ABDOMINAL AORTAGRAM N/A 03/29/2012   Procedure: ABDOMINAL Maxcine Ham;  Surgeon: Serafina Mitchell, MD;  Location: [facility];  Service: Cardiovascular;  Laterality: N/A;  . ADENOIDECTOMY  age 39  . CATARACT EXTRACTION W/ INTRAOCULAR LENS  IMPLANT, BILATERAL  2009  . COLONOSCOPY  2001, 05/02/2003, 01/28/11   2012: Right colon Crohn's, rectal polyp  . COLONOSCOPY  03/31/2012   Procedure: COLONOSCOPY;  Surgeon: Jerene Bears, MD;  Location: [facility];  Service: Gastroenterology;  Laterality: N/A;  . ESOPHAGOGASTRODUODENOSCOPY  01/28/11   Normal  . FOOT SURGERY Right age 76  . MOHS SURGERY Left 11/2013   left ankle parakerotosis   . PERCUTANEOUS LIVER BIOPSY  2007 and 2008  . PILONIDAL CYST EXCISION  age 82  . PLACEMENT OF SETON N/A 11/06/2015   Procedure: PLACEMENT OF  SETON;  Surgeon: [name], MD;  Location: [facility];  Service: General;  Laterality: N/A;  . UPPER GASTROINTESTINAL ENDOSCOPY      Prior to Admission medications   Medication Sig Start Date End Date Taking? Authorizing Provider  AMBULATORY NON FORMULARY MEDICATION Dilitazem 2% cream  Apply pea size amount to rectum twice daily 05/02/18   [name], MD  ascorbic acid (VITAMIN C) 1000 MG tablet Take 2,000 mg by mouth daily.     [provider]  Calcium Carb-Cholecalciferol (CALCIUM 1000 + D) 1000-800 MG-UNIT TABS Take by mouth. Takes 2 (2016m) tabs bid    [provider]  cholecalciferol (VITAMIN D) 1000 UNITS tablet Take 4,000 Units by mouth 2 (two) times daily.  [provider]  clonazePAM (KLONOPIN) 0.5 MG tablet Take 1 tablet (0.5 mg total) by mouth at bedtime as needed (insomnia). 11/28/14   [name], MD  fluticasone (FLONASE) 50 MCG/ACT nasal spray Place 2 sprays into the nose every morning. Each nostril  08/11/12   [provider]  HYDROcodone-acetaminophen (NORCO/VICODIN) 5-325 MG tablet Take 1-2 tablets by mouth every 6 (six) hours as needed for moderate pain. Written rx printed in advance May fill on or after 02/24/2018 02/16/18   [name], MD  mirtazapine (REMERON) 30 MG tablet Take 30 mg by mouth at bedtime.    [provider]  predniSONE (DELTASONE) 10 MG tablet Take 10 mg by mouth daily with breakfast.    [provider]  Vedolizumab (ENTYVIO IV) Inject into the vein. Every 2 months    [provider]  vitamin A 25000 UNIT capsule Take 25,000 Units by mouth daily.    [provider]    Current Facility-Administered Medications  Medication Dose Route Frequency Provider Last Rate Last Dose  . potassium chloride 10 mEq in 100 mL IVPB  10 mEq Intravenous Once Charlesetta Shanks, MD 100 mL/hr at 05/28/18 1224 10 mEq at 05/28/18 1224   Current Outpatient Medications   Medication Sig Dispense Refill  . AMBULATORY NON FORMULARY MEDICATION Dilitazem 2% cream  Apply pea size amount to rectum twice daily 30 g 3  . ascorbic acid (VITAMIN C) 1000 MG tablet Take 2,000 mg by mouth daily.     . Calcium Carb-Cholecalciferol (CALCIUM 1000 + D) 1000-800 MG-UNIT TABS Take by mouth. Takes 2 (2073m) tabs bid    . cholecalciferol (VITAMIN D) 1000 UNITS tablet Take 4,000 Units by mouth 2 (two) times daily.     . clonazePAM (KLONOPIN) 0.5 MG tablet Take 1 tablet (0.5 mg total) by mouth at bedtime as needed (insomnia). 20 tablet 0  . fluticasone (FLONASE) 50 MCG/ACT nasal spray Place 2 sprays into the nose every morning. Each nostril     . HYDROcodone-acetaminophen (NORCO/VICODIN) 5-325 MG tablet Take 1-2 tablets by mouth every 6 (six) hours as needed for moderate pain. Written rx printed in advance May fill on or after 02/24/2018 30 tablet 0  . Vedolizumab (ENTYVIO IV) Inject into the vein. Every 2 months    . vitamin A 25000 UNIT capsule Take 25,000 Units by mouth daily.      Allergies as of 05/28/2018 - Review Complete 05/28/2018  Allergen Reaction Noted  . Asacol [mesalamine] Diarrhea 03/09/2011  . Flagyl [metronidazole] Diarrhea 02/05/2016  . Azathioprine Diarrhea 08/07/2010  . Gluten meal Diarrhea 03/21/2015  . Mycophenolate mofetil Diarrhea 08/07/2010  . Other  01/18/2012  . Wheat bran Diarrhea 01/18/2012  . Tape Itching and Rash 03/21/2015    Family History  Problem Relation Age of Onset  . Heart disease Father   . Thyroid cancer Father   . Allergies Father   . Clotting disorder Father   . Breast cancer Mother   . Stomach cancer Mother   . Colon cancer Neg Hx   . Esophageal cancer Neg Hx   . Rectal cancer Neg Hx     Social History   Socioeconomic History  . Marital status: Single    Spouse name: Not on file  . Number of children: 1  . Years of education: Not on file  . Highest education level: Not on file  Occupational History  . Occupation:  Disability   Social Needs  . Financial resource strain: Not on  file  . Food insecurity:    Worry: Not on file    Inability: Not on file  . Transportation needs:    Medical: Not on file    Non-medical: Not on file  Tobacco Use  . Smoking status: Former Smoker    Packs/day: 1.00    Years: 15.00    Pack years: 15.00    Types: Cigarettes    Last attempt to quit: 03/28/1997    Years since quitting: 21.1  . Smokeless tobacco: Never Used  Substance and Sexual Activity  . Alcohol use: No    Alcohol/week: 0.0 standard drinks  . Drug use: No    Comment: QUIT USING DRUGS IN 1997  . Sexual activity: Not Currently  Lifestyle  . Physical activity:    Days per week: Not on file    Minutes per session: Not on file  . Stress: Not on file  Relationships  . Social connections:    Talks on phone: Not on file    Gets together: Not on file    Attends religious service: Not on file    Active member of club or organization: Not on file    Attends meetings of clubs or organizations: Not on file    Relationship status: Not on file  . Intimate partner violence:    Fear of current or ex partner: Not on file    Emotionally abused: Not on file    Physically abused: Not on file    Forced sexual activity: Not on file  Other Topics Concern  . Not on file  Social History Narrative   Single, history of substance abuse in recovery   Previously occupied on medical disability   1 child    Elderly parents involved and he helps them   1 brother in [name]    Review of Systems: All systems reviewed and negative except where noted in HPI.  Physical Exam: Vital signs in last 24 hours: Temp:  [98 F (36.7 C)-98.1 F (36.7 C)] 98 F (36.7 C) (03/29 1200) Pulse Rate:  [90-97] 91 (03/29 1200) Resp:  [17] 17 (03/29 1200) BP: (88-138)/(52-87) 101/65 (03/29 1200) SpO2:  [99 %-100 %] 99 % (03/29 1200) Weight:  [77.4 kg] 77.4 kg (03/29 0923)   General:   Alert, thin male in NAD Psych:   Pleasant, cooperative. Normal mood and affect. Eyes:  Pupils equal, sclera clear, no icterus.   Conjunctiva pink. Ears:  Normal auditory acuity. Nose:  No deformity, discharge,  or lesions. Neck:  Supple; no masses Lungs:  Clear throughout to auscultation.   No wheezes, crackles, or rhonchi.  Heart:  Regular rate and rhythm; no murmurs, no lower extremity edema Abdomen:  Soft, non-distended, nontender, BS active, no palp mass    Rectal:  Deferred  Msk:  Symmetrical without gross deformities. . Neurologic:  Alert and  oriented x4;  grossly normal neurologically. Skin:  BLE with scattered red patches / soft nodules. LLE mildly edematous.    Intake/Output from previous day: No intake/output data recorded. Intake/Output this shift: No intake/output data recorded.  Lab Results: Recent Labs    05/28/18 1008  WBC 6.9  HGB 12.3*  HCT 37.9*  PLT 256   BMET Recent Labs    05/28/18 1008  NA 133*  K 2.6*  CL 101  CO2 24  GLUCOSE 65*  BUN <5*  CREATININE 0.77  CALCIUM 7.5*   LFT Recent Labs    05/28/18 1008  PROT 4.7*  ALBUMIN 2.2*  AST 22  ALT 19  ALKPHOS 121  BILITOT 0.7   PT/INR Recent Labs    05/28/18 1008  LABPROT 14.4  INR 1.1   Hepatitis Panel No results for input(s): HEPBSAG, HCVAB, HEPAIGM, HEPBIGM in the last 72 hours.    Studies/Results: No results found.   Tye Savoy, NP-C @  05/28/2018, 12:51 PM

## 2018-05-28 NOTE — ED Notes (Signed)
Date and time results received: 05/28/18 1118 (use smartphrase ".now" to insert current time)  Test: K+ Critical Value: 2.6  Name of Provider Notified: Dr. Johnney Killian  Orders Received? Or Actions Taken?:

## 2018-05-28 NOTE — ED Provider Notes (Signed)
[facility] DEPT Provider Note   CSN: [id number] Arrival date & time: 05/28/18  5462    History   Chief Complaint Chief Complaint  Patient presents with  . Rectal Bleeding    Chron's patient     HPI [name] is a 53 y.o. male.     HPI Patient has long history of Crohn's disease.  He is treated with [name].  Patient has chronic perirectal abscess with fistula.  He has been having multiple episodes of bloody diarrhea per day.  No vomiting.  Very poor appetite.  He reports he eats essentially chicken and rice cakes.  He reports he has been declining over several months.  He reports the past couple of days have gotten very bad.  He reports he is extremely weak.  He reports all he can do some basic activities and then mostly staying in bed or in a chair.  Denies he is had any fever.  He reports he has pretty characteristic cramping lower abdominal pain.  Reports he is felt really agitated.  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  Patient has essentially self discontinued his prednisone first tapering it and then discontinue it completely about 3 to 4 days ago.  He reports he felt like it was making him feel terrible with worsening weakness and diarrhea.  He does continue to receive Entyvio infusion every 8 weeks.  He has however been getting continual weight loss.  Last colonoscopy March 2019 with many inflammatory pseudopolyps. Past Medical History:  Diagnosis Date  . Allergy   . Anal fistula   . Anxiety   . 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 Autoimmune hepatitis ([place]) 01/19/2013  . Cancer of skin of neck   . Cataract   . Chronic mesenteric ischemia ([place])   . Chronic pain syndrome 07/09/2016  . Crohn's disease of small and large intestines ([place])    followed by dr Glendell Docker gessner  . Dairy product intolerance   . Diarrhea, functional   . Family history of adverse reaction to  anesthesia   . Grover's disease    transient acantholytic dermatosis  . History of alcohol abuse   . History of basal cell carcinoma excision    2013 left leg  . History of Clostridium difficile    10/ 2014  . History of multiple concussions    x6   last one Jan 2017 per pt--  no residual  . History of substance abuse ([place])    quit 1997 per pt  . History of suicide attempt    05-18-2012  overdose /  failure to thrive  . Hypercholesterolemia   . Iron deficiency anemia due to chronic blood loss   . Major depression, recurrent, chronic ([place])   . Osteopenia   . Personal history of adenomatous colonic polyps 12/2010, 03/2012   12/2010 - 8 mm serrated adenoma of rectum  . Portal vein thrombosis 03/21/2015   right  . Primary sclerosing cholangitis    ? hepatitis overlap - liver bx x 2 and MRCP  . Seasonal allergies   . Substance abuse ([place]) 1997   Alcohol  . Vitamin A deficiency 05/23/2018  . Vitamin C deficiency 05/23/2018    Patient Active Problem List   Diagnosis Date Noted  . Vitamin C deficiency 05/23/2018  . Vitamin A deficiency 05/23/2018  . Loss of weight 04/07/2018  . Grover's disease 11/04/2017  .  Chronic pain syndrome 07/09/2016  . Perirectal fistula 05/18/2016  . Portal vein thrombosis 03/28/2015  . Insomnia 11/29/2014  . Long-term use of immunosuppressant medication - Entyvio 11/28/2014  . Posterior Anal fissure 10/03/2014  . Anemia due to chronic blood loss 02/11/2014  . 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 Rash on trunk 12/31/2012  . Arthralgia 08/28/2012  . Major depressive disorder, recurrent episode, severe ([place]) 04/12/2012  . Personal history of adenomatous colonic polyps 01/18/2012  . Osteopenia 02/17/2011  . Long term current use of systemic steroids 08/11/2010  . HYPERTENSION 08/09/2008  . VITAMIN D DEFICIENCY 09/26/2007  . HYPERLIPIDEMIA, SEVERE 09/26/2007  . CROHN'S DISEASE, LARGE AND SMALL INTESTINES 03/28/2007    Past Surgical  History:  Procedure Laterality Date  . ABDOMINAL AORTAGRAM N/A 03/29/2012   Procedure: ABDOMINAL Maxcine Ham;  Surgeon: Serafina Mitchell, MD;  Location: [facility] CATH LAB;  Service: Cardiovascular;  Laterality: N/A;  . ADENOIDECTOMY  age 38  . CATARACT EXTRACTION W/ INTRAOCULAR LENS  IMPLANT, BILATERAL  2009  . COLONOSCOPY  2001, 05/02/2003, 01/28/11   2012: Right colon Crohn's, rectal polyp  . COLONOSCOPY  03/31/2012   Procedure: COLONOSCOPY;  Surgeon: Jerene Bears, MD;  Location: [facility];  Service: Gastroenterology;  Laterality: N/A;  . ESOPHAGOGASTRODUODENOSCOPY  01/28/11   Normal  . FOOT SURGERY Right age 71  . MOHS SURGERY Left 11/2013   left ankle parakerotosis   . PERCUTANEOUS LIVER BIOPSY  2007 and 2008  . PILONIDAL CYST EXCISION  age 70  . PLACEMENT OF SETON N/A 11/06/2015   Procedure: PLACEMENT OF SETON;  Surgeon: [name], MD;  Location: [facility];  Service: General;  Laterality: N/A;  . UPPER GASTROINTESTINAL ENDOSCOPY          Home Medications    Prior to Admission medications   Medication Sig Start Date End Date Taking? Authorizing Provider  AMBULATORY NON FORMULARY MEDICATION Dilitazem 2% cream  Apply pea size amount to rectum twice daily 05/02/18   [name], MD  ascorbic acid (VITAMIN C) 1000 MG tablet Take 2,000 mg by mouth daily.     [provider]  Calcium Carb-Cholecalciferol (CALCIUM 1000 + D) 1000-800 MG-UNIT TABS Take by mouth. Takes 2 (2044m) tabs bid    [provider]  cholecalciferol (VITAMIN D) 1000 UNITS tablet Take 4,000 Units by mouth 2 (two) times daily.     [provider]  clonazePAM (KLONOPIN) 0.5 MG tablet Take 1 tablet (0.5 mg total) by mouth at bedtime as needed (insomnia). 11/28/14   GGatha Mayer MD  fluticasone (FLONASE) 50 MCG/ACT nasal spray Place 2 sprays into the nose every morning. Each nostril  08/11/12   [provider]  HYDROcodone-acetaminophen (NORCO/VICODIN) 5-325 MG tablet  Take 1-2 tablets by mouth every 6 (six) hours as needed for moderate pain. Written rx printed in advance May fill on or after 02/24/2018 02/16/18   GGatha Mayer MD  mirtazapine (REMERON) 30 MG tablet Take 30 mg by mouth at bedtime.    [provider]  predniSONE (DELTASONE) 10 MG tablet Take 10 mg by mouth daily with breakfast.    [provider]  Vedolizumab (ENTYVIO IV) Inject into the vein. Every 2 months    [provider]  vitamin A 25000 UNIT capsule Take 25,000 Units by mouth daily.    [provider]    Family History Family History  Problem Relation Age of Onset  . Heart disease Father   . Thyroid cancer  Father   . Allergies Father   . Clotting disorder Father   . Breast cancer Mother   . Stomach cancer Mother   . Colon cancer Neg Hx   . Esophageal cancer Neg Hx   . Rectal cancer Neg Hx     Social History Social History   Tobacco Use  . Smoking status: Former Smoker    Packs/day: 1.00    Years: 15.00    Pack years: 15.00    Types: Cigarettes    Last attempt to quit: 03/28/1997    Years since quitting: 21.1  . Smokeless tobacco: Never Used  Substance Use Topics  . Alcohol use: No    Alcohol/week: 0.0 standard drinks  . Drug use: No    Comment: QUIT USING DRUGS IN 1997     Allergies   Asacol [mesalamine]; Flagyl [metronidazole]; Azathioprine; Gluten meal; Mycophenolate mofetil; Other; Wheat bran; and Tape   Review of Systems Review of Systems 10 Systems reviewed and are negative for acute change except as noted in the HPI.  Physical Exam Updated Vital Signs BP 101/65 (BP Location: Left Arm)   Pulse 91   Temp 98 F (36.7 C) (Oral)   Resp 17   Ht 5' 11"  (1.803 m)   Wt 77.4 kg   SpO2 99%   BMI 23.80 kg/m   Physical Exam Constitutional:      Comments: Patient is alert with clear mental status.  No respiratory distress.  Pale and slightly uncomfortable in appearance.  HENT:     Head: Normocephalic and  atraumatic.  Eyes:     Extraocular Movements: Extraocular movements intact.  Cardiovascular:     Rate and Rhythm: Normal rate and regular rhythm.  Pulmonary:     Effort: Pulmonary effort is normal.     Breath sounds: Normal breath sounds.  Abdominal:     Palpations: Abdomen is soft.     Comments: Soft.  Moderate lower abdominal and left lower quadrant tenderness to palpation.  No guarding.  Genitourinary:    Rectum: Guaiac result negative.     Comments: Perianal area shows an open lesion on the left perianal region with a somewhat indurated approximately 2-1/2 cm adjacent tender region.  No drainage or discharge from opening with palpation.  Appears to be some red, dried blood generally through the anal region.  No large general swelling or hemorrhoids of the anus.  No appearance of general cellulitis.  No active bleeding or active drainage. Musculoskeletal: Normal range of motion.     Comments: Patient has a papular rash on the left pretibial surface just above and around the ankle.  He reports this has been chronic for a few months now.  Skin:    General: Skin is warm and dry.     Comments: Diffuse, pink papular lesions over the chest and abdomen.  Early subtle in appearance.  These are chronic reportedly Grovers disease.  Neurological:     General: No focal deficit present.     Mental Status: He is oriented to person, place, and time.     Coordination: Coordination normal.  Psychiatric:        Mood and Affect: Mood normal.     Comments: Patient is alert and interactive.  He does have good eye contact.  Patient is not tearful or tremulous.  Reports he does feel agitated.      ED Treatments / Results  Labs (all labs ordered are listed, but only abnormal results are displayed) Labs Reviewed  COMPREHENSIVE  METABOLIC PANEL - Abnormal; Notable for the following components:      Result Value   Sodium 133 (*)    Potassium 2.6 (*)    Glucose, Bld 65 (*)    BUN <5 (*)    Calcium 7.5  (*)    Total Protein 4.7 (*)    Albumin 2.2 (*)    All other components within normal limits  CBC WITH DIFFERENTIAL/PLATELET - Abnormal; Notable for the following components:   Hemoglobin 12.3 (*)    HCT 37.9 (*)    Monocytes Absolute 1.3 (*)    All other components within normal limits  URINALYSIS, ROUTINE W REFLEX MICROSCOPIC - Abnormal; Notable for the following components:   Ketones, ur 5 (*)    All other components within normal limits  SEDIMENTATION RATE - Abnormal; Notable for the following components:   Sed Rate 50 (*)    All other components within normal limits  C-REACTIVE PROTEIN - Abnormal; Notable for the following components:   CRP 11.4 (*)    All other components within normal limits  LIPASE, BLOOD  PROTIME-INR  MAGNESIUM  PHOSPHORUS  TSH  LACTIC ACID, PLASMA  LACTIC ACID, PLASMA  CORTISOL  VITAMIN A  VITAMIN B1  VITAMIN B12  VITAMIN B6  VITAMIN C  VITAMIN D 25 HYDROXY (VIT D DEFICIENCY, FRACTURES)  VITAMIN E  VITAMIN K1, SERUM  CALCITRIOL (1,25 DI-OH VIT D)    EKG None  Radiology No results found.  Procedures Procedures (including critical care time)  Medications Ordered in ED Medications  potassium chloride 10 mEq in 100 mL IVPB (10 mEq Intravenous New Bag/Given 05/28/18 1224)  lactated ringers bolus 1,000 mL (0 mLs Intravenous Stopped 05/28/18 1117)  sodium chloride 0.9 % 1,000 mL with thiamine 397 mg, folic acid 1 mg, multivitamins adult 10 mL infusion ( Intravenous New Bag/Given 05/28/18 1012)  LORazepam (ATIVAN) injection 1 mg (1 mg Intravenous Given 05/28/18 1008)  potassium chloride SA (K-DUR,KLOR-CON) CR tablet 40 mEq (40 mEq Oral Given 05/28/18 1224)     Initial Impression / Assessment and Plan / ED Course  I have reviewed the triage vital signs and the nursing notes.  Pertinent labs & imaging results that were available during my care of the patient were reviewed by me and considered in my medical decision making (see chart for details).   Clinical Course as of May 27 1256  Sun May 28, 2018  1257 Consult: Reviewed with Kathyrn Sheriff gastroenterology.  Advises patient is failing outpatient treatment and admit.  Low Exie Parody will determine further diagnostic testing and management.   [MP]    Clinical Course User Index [MP] Charlesetta Shanks, MD      Patient failing outpatient treatment for complicated Crohn's disease.  Patient has hypokalemia which may account for some of his sensation of being agitated and generally very weak.  Patient reports persistent bloody diarrhea.  He has discontinued outpatient use of prednisone.  Plan for admission per consultation with gastroenterology.  Final Clinical Impressions(s) / ED Diagnoses   Final diagnoses:  Exacerbation of Crohn's disease with other complication [facility])  Hypokalemia    ED Discharge Orders    None       Charlesetta Shanks, MD 05/28/18 1300

## 2018-05-28 NOTE — ED Notes (Addendum)
Report called to Beverlee Nims, Therapist, sports. Pt ready to be transported to room 1607

## 2018-05-28 NOTE — H&P (Signed)
History and Physical    ZYLER HYSON UGQ:[id number] DOB: 11-12-1965 DOA: 05/28/2018  PCP: [name], MD Consultants:  Delrae Rend; Pablo Ledger - dermatology Patient coming from:  Home - lives with parents; [facility]: Parents, [phone]  Chief Complaint: rectal bleeding  HPI: [name] is a 53 y.o. male with medical history significant of depression; substance abuse; Crohn's; autoimmune hepatitis; and chronic pain syndrome presenting with rectal bleeding. He has had a gradual decline.  He struggled through the end of the year last year with deteriorating conditions, more blood and more gas.  By Christmas, things weren't going well.  He started with bland diet and trying to calm down the flare.  "But I failed.  I took 3 months to do it and I failed."  He eliminated vitamins and most of his medicines and it still didn't get better.  He has been tapering down off prednisone since January - he thought it was making his stomach hurt and making him bleed more, and he didn't think it was helping his digestion so he was trying to "eliminate an irritant."  When this happened several years ago, he was referred to hospice and went to inpatient hospice and "they had to throw me out of hospice because I wouldn't die."  No fever.  He slowly tapered off prednisone off 2 weeks and thinks he didn't have withdrawal but he noticed feeling a little hot after coming off of it (not febrile, just "hot").  He would have cold chills before he would pass blood.  Last night, he had about 7 episodes in a 2 hour period.  He was nauseated, had light-headedness, had difficulty navigating back to his bedroom.  He acknowledges ongoing depression.  He has had multiple suicide attempts.  He reports that he was addicted to drugs and alcohol for 15 years and stopped for 2 years before being diagnosed with Crohn's.  Both ways were horrible, but this actually makes him feel worse.  He denies active SI/HI but does have  suicidal thoughts.   ED Course:  Crohn's exacerbation.  Failing outpatient management.  No active bleeding, but has a fistula without obvious abscess.  Weak, tremulous, has low K+.  Gi to consult and will decide on further imaging/management.  He has been tapering his prednisone and self-discontinued 4 days ago - low current suspicion for adrenal crisis.  Review of Systems: As per HPI; otherwise review of systems reviewed and negative.   Ambulatory Status:  Ambulates without assistance  Past Medical History:  Diagnosis Date  . Allergy   . Anal fistula   . Anxiety   . 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 Autoimmune hepatitis ([place]) 01/19/2013  . Cancer of skin of neck   . Cataract   . Chronic mesenteric ischemia ([place])   . Chronic pain syndrome 07/09/2016  . Crohn's disease of small and large intestines ([place])    followed by dr Glendell Docker gessner  . Dairy product intolerance   . Diarrhea, functional   . Family history of adverse reaction to anesthesia   . Grover's disease    transient acantholytic dermatosis  . History of alcohol abuse   . History of basal cell carcinoma excision    2013 left leg  . History of Clostridium difficile    10/ 2014  . History of multiple concussions    x6   last one Jan 2017 per pt--  no residual  . History of substance abuse ([place])  quit 1997 per pt  . History of suicide attempt    05-18-2012  overdose /  failure to thrive  . Hypercholesterolemia   . Iron deficiency anemia due to chronic blood loss   . Major depression, recurrent, chronic ([place])   . Osteopenia   . Personal history of adenomatous colonic polyps 12/2010, 03/2012   12/2010 - 8 mm serrated adenoma of rectum  . Portal vein thrombosis 03/21/2015   right  . Primary sclerosing cholangitis    ? hepatitis overlap - liver bx x 2 and MRCP  . Seasonal allergies   . Substance abuse ([place]) 1997   Alcohol  . Vitamin A deficiency 05/23/2018  . Vitamin C deficiency 05/23/2018    Past Surgical  History:  Procedure Laterality Date  . ABDOMINAL AORTAGRAM N/A 03/29/2012   Procedure: ABDOMINAL Maxcine Ham;  Surgeon: Serafina Mitchell, MD;  Location: [facility] CATH LAB;  Service: Cardiovascular;  Laterality: N/A;  . ADENOIDECTOMY  age 1  . CATARACT EXTRACTION W/ INTRAOCULAR LENS  IMPLANT, BILATERAL  2009  . COLONOSCOPY  2001, 05/02/2003, 01/28/11   2012: Right colon Crohn's, rectal polyp  . COLONOSCOPY  03/31/2012   Procedure: COLONOSCOPY;  Surgeon: Jerene Bears, MD;  Location: [facility];  Service: Gastroenterology;  Laterality: N/A;  . ESOPHAGOGASTRODUODENOSCOPY  01/28/11   Normal  . FOOT SURGERY Right age 8  . MOHS SURGERY Left 11/2013   left ankle parakerotosis   . PERCUTANEOUS LIVER BIOPSY  2007 and 2008  . PILONIDAL CYST EXCISION  age 25  . PLACEMENT OF SETON N/A 11/06/2015   Procedure: PLACEMENT OF SETON;  Surgeon: [name], MD;  Location: [facility];  Service: General;  Laterality: N/A;  . UPPER GASTROINTESTINAL ENDOSCOPY      Social History   Socioeconomic History  . Marital status: Single    Spouse name: Not on file  . Number of children: 1  . Years of education: Not on file  . Highest education level: Not on file  Occupational History  . Occupation: Disability   Social Needs  . Financial resource strain: Not on file  . Food insecurity:    Worry: Not on file    Inability: Not on file  . Transportation needs:    Medical: Not on file    Non-medical: Not on file  Tobacco Use  . Smoking status: Former Smoker    Packs/day: 1.00    Years: 15.00    Pack years: 15.00    Types: Cigarettes    Last attempt to quit: 03/28/1997    Years since quitting: 21.1  . Smokeless tobacco: Never Used  Substance and Sexual Activity  . Alcohol use: No    Alcohol/week: 0.0 standard drinks  . Drug use: No    Comment: QUIT USING DRUGS IN 1997  . Sexual activity: Not Currently  Lifestyle  . Physical activity:    Days per week: Not on file    Minutes per session: Not on  file  . Stress: Not on file  Relationships  . Social connections:    Talks on phone: Not on file    Gets together: Not on file    Attends religious service: Not on file    Active member of club or organization: Not on file    Attends meetings of clubs or organizations: Not on file    Relationship status: Not on file  . Intimate partner violence:    Fear of current or ex partner: Not on file  Emotionally abused: Not on file    Physically abused: Not on file    Forced sexual activity: Not on file  Other Topics Concern  . Not on file  Social History Narrative   Single, history of substance abuse in recovery   Previously occupied on medical disability   1 child    Elderly parents involved and he helps them   1 brother in [place]  . Asacol [Mesalamine] Diarrhea    abd pain  . Flagyl [Metronidazole] Diarrhea    abd pain  . Azathioprine Diarrhea    abd pain  . Gluten Meal Diarrhea  . Mycophenolate Mofetil Diarrhea    abd pain  . Other     NUTS; constipation  . Wheat Bran Diarrhea    Constipation; flatulence; abd pain  . Tape Itching and Rash    Please use "paper" tape    Family History  Problem Relation Age of Onset  . Heart disease Father   . Thyroid cancer Father   . Allergies Father   . Clotting disorder Father   . Breast cancer Mother   . Stomach cancer Mother   . Colon cancer Neg Hx   . Esophageal cancer Neg Hx   . Rectal cancer Neg Hx     Prior to Admission medications   Medication Sig Start Date End Date Taking? Authorizing Provider  AMBULATORY NON FORMULARY MEDICATION Dilitazem 2% cream  Apply pea size amount to rectum twice daily 05/02/18   [name], MD  ascorbic acid (VITAMIN C) 1000 MG tablet Take 2,000 mg by mouth daily.     [provider]  Calcium Carb-Cholecalciferol (CALCIUM 1000 + D) 1000-800 MG-UNIT TABS Take by mouth. Takes 2 (2058m) tabs bid    [provider]   cholecalciferol (VITAMIN D) 1000 UNITS tablet Take 4,000 Units by mouth 2 (two) times daily.     [provider]  clonazePAM (KLONOPIN) 0.5 MG tablet Take 1 tablet (0.5 mg total) by mouth at bedtime as needed (insomnia). 11/28/14   GGatha Mayer MD  fluticasone (FLONASE) 50 MCG/ACT nasal spray Place 2 sprays into the nose every morning. Each nostril  08/11/12   [provider]  HYDROcodone-acetaminophen (NORCO/VICODIN) 5-325 MG tablet Take 1-2 tablets by mouth every 6 (six) hours as needed for moderate pain. Written rx printed in advance May fill on or after 02/24/2018 02/16/18   GGatha Mayer MD  mirtazapine (REMERON) 30 MG tablet Take 30 mg by mouth at bedtime.    [provider]  predniSONE (DELTASONE) 10 MG tablet Take 10 mg by mouth daily with breakfast.    [provider]  Vedolizumab (ENTYVIO IV) Inject into the vein. Every 2 months    [provider]  vitamin A 25000 UNIT capsule Take 25,000 Units by mouth daily.    [provider]    Physical Exam: Vitals:   05/28/18 1230 05/28/18 1300 05/28/18 1330 05/28/18 1435  BP: 119/81 105/85 114/81 125/80  Pulse: 87 88 94 89  Resp:   17 16  Temp:   98.2 F (36.8 C) 98.3 F (36.8 C)  TempSrc:   Oral Oral  SpO2: 100% 98% 99% 99%  Weight:    74.7 kg  Height:    6' 2"  (1.88 m)     . General:  Appears calm and comfortable and is NAD . Eyes:  PERRL, EOMI, normal lids, iris . ENT:  grossly normal hearing,  lips & tongue, mmm; appropriate dentition . Neck: no LAD, masses or thyromegaly . Cardiovascular:  RRR, no m/r/g. No LE edema.  Marland Kitchen Respiratory:   CTA bilaterally with no wheezes/rales/rhonchi.  Normal respiratory effort. . Abdomen:  soft, mild suprapubic TTP, ND, NABS . Per Dr. Ermalinda Memos: Genitourinary:    Rectum: Guaiac result negative.     Comments: Perianal area shows an open lesion on the left perianal region with a somewhat indurated approximately 2-1/2 cm adjacent tender  region.  No drainage or discharge from opening with palpation.  Appears to be some red, dried blood generally through the anal region.  No large general swelling or hemorrhoids of the anus.  No appearance of general cellulitis.  No active bleeding or active drainage. . Skin: maculopapular excoriated rash along L > R anterior LE . Musculoskeletal:  grossly normal tone BUE/BLE, good ROM, no bony abnormality . Psychiatric: depressed mood and affect, speech fluent and appropriate, AOx3 . Neurologic:  CN 2-12 grossly intact, moves all extremities in coordinated fashion, sensation intact    Radiological Exams on Admission: No results found.  EKG: not done   Labs on Admission: I have personally reviewed the available labs and imaging studies at the time of the admission.  Pertinent labs:   Na++ 133 K+ 2.6 Glucose 65 Albumin 2.2 CRP 11.4 Lactate 1.0 WBC 6.9 Hgb 12.3 INR 1.1 TSH 4.496 UA: 5 ketones  Assessment/Plan Principal Problem:   Crohn's colitis, with fistula ([name]) Active Problems:   Essential hypertension   Major depressive disorder, recurrent episode, severe ([name])   Autoimmune hepatitis-PSC overlap   Chronic pain syndrome   Grover's disease   Crohn's -Patient with h/o Crohn's, not on chronic controlling medications at this time and has weaned himself off steroids -He has failed outpatient management and presents for GI consultation and assistance -He reports adamant desire to NOT resume steroids, and appears to prefer colostomy at this time -Low suspicion for infectious etiology so will not start antibiotics at this time -Will give gentle IVF hydration and patient will be on clear liquids for now while formulating a plan with GI -Pain control with low-dose morphine and Vicodin (has h/o polysubstance so will need to monitor this closely)  MDD -Patient with h/o depression with suicidal ideation -He clearly continues to have suicidal thoughts but denies plan or the  energy to execute a plan -He has weaned himself off medications (most recently Remeron) -He contracts for safety -Will ask psychiatry to consult tomorrow -Continue prn Klonopin  Autoimmune hepatitis-PSC -Normal LFTs  -Followed by GI -Previously scheduled for MRI, may need while here but will defer to GI  Chronic pain with h/o polysubstance abuse -Continue Norco and add prn IV morphine -Monitor for overuse or other behaviors concerning for drug-seeking  Grover's disease -The current rash on his LE may be c/w Grover's, although this usually occurs on the abdomen  -It also could be eczema -Will treat with Eucerin:TAC  HTN -He does not appear to be taking medications for this issue at this time   DVT prophylaxis:  SCDs Code Status:  DNR - confirmed with patient Family Communication: None present  Disposition Plan:  Home once clinically improved Consults called: GI  Admission status: Admit - It is my clinical opinion that admission to INPATIENT is reasonable and necessary because of the expectation that this patient will require hospital care that crosses at least 2 midnights to treat this condition based on the medical complexity of the problems presented.  Given the  aforementioned information, the predictability of an adverse outcome is felt to be significant.     Karmen Bongo MD [facility]   How to contact the [facility] Attending or Consulting provider [place] or covering provider during after hours [place], for this patient?  1. Check the care team in [facility] and look for a) attending/consulting [facility] provider listed and b) the [facility] team listed 2. Log into [email or web address] and use [place]'s universal password to access. If you do not have the password, please contact the hospital operator. 3. Locate the [facility] provider you are looking for under [facility] and page to a number that you can be directly reached. 4. If you still have difficulty reaching the provider, please page  the [facility] (Director on Call) for the Hospitalists listed on amion for assistance.   05/28/2018, 3:12 PM

## 2018-05-29 ENCOUNTER — Encounter (HOSPITAL_COMMUNITY): Payer: Self-pay | Admitting: Anesthesiology

## 2018-05-29 LAB — BASIC METABOLIC PANEL
Anion gap: 9 (ref 5–15)
BUN: <5 mg/dL — ABNORMAL LOW (ref 6–20)
CALCIUM: 7.5 mg/dL — AB (ref 8.9–10.3)
CO2: 23 mmol/L (ref 22–32)
Chloride: 101 mmol/L (ref 98–111)
Creatinine, Ser: 0.9 mg/dL (ref 0.61–1.24)
GFR calc Af Amer: >60 mL/min (ref >60)
GFR calc non Af Amer: >60 mL/min (ref >60)
Glucose, Bld: 55 mg/dL — ABNORMAL LOW (ref 70–99)
Potassium: 3.2 mmol/L — ABNORMAL LOW (ref 3.5–5.1)
SODIUM: 133 mmol/L — AB (ref 135–145)

## 2018-05-29 LAB — CBC
HCT: 33.5 % — ABNORMAL LOW (ref 39.0–52.0)
Hemoglobin: 10.5 g/dL — ABNORMAL LOW (ref 13.0–17.0)
MCH: 27.4 pg (ref 26.0–34.0)
MCHC: 31.3 g/dL (ref 30.0–36.0)
MCV: 87.5 fL (ref 80.0–100.0)
PLATELETS: 218 10*3/uL (ref 150–400)
RBC: 3.83 MIL/uL — ABNORMAL LOW (ref 4.22–5.81)
RDW: 14.2 % (ref 11.5–15.5)
WBC: 6.9 10*3/uL (ref 4.0–10.5)
nRBC: 0 % (ref 0.0–0.2)

## 2018-05-29 LAB — CALCITRIOL (1,25 DI-OH VIT D): Vit D, 1,25-Dihydroxy: 17.5 pg/mL — ABNORMAL LOW (ref 19.9–79.3)

## 2018-05-29 LAB — HIV ANTIBODY (ROUTINE TESTING W REFLEX): HIV Screen 4th Generation wRfx: NONREACTIVE

## 2018-05-29 LAB — VITAMIN B6: Vitamin B6: 2 ug/L — ABNORMAL LOW (ref 5.3–46.7)

## 2018-05-29 LAB — CLOSTRIDIUM DIFFICILE BY PCR, REFLEXED: CDIFFPCR: NEGATIVE

## 2018-05-29 LAB — VITAMIN D 25 HYDROXY (VIT D DEFICIENCY, FRACTURES): Vit D, 25-Hydroxy: 30.7 ng/mL (ref 30.0–100.0)

## 2018-05-29 MED ORDER — BOOST / RESOURCE BREEZE PO LIQD CUSTOM: 1.0000 | ORAL | Status: DC

## 2018-05-29 MED ORDER — UNJURY CHICKEN SOUP POWDER
8.0000 [oz_av] | Freq: Two times a day (BID) | ORAL | Status: DC
  Filled 2018-05-29 (×7): qty 27

## 2018-05-29 MED ORDER — ADULT MULTIVITAMIN W/MINERALS CH
1.0000 | ORAL_TABLET | Freq: Every day | ORAL | Status: DC
  Administered 2018-05-30 – 2018-05-31 (×2): 1 via ORAL
  Filled 2018-05-29 (×2): qty 1

## 2018-05-29 MED ORDER — NYSTATIN 100000 UNIT/GM EX CREA
TOPICAL_CREAM | Freq: Two times a day (BID) | CUTANEOUS | Status: DC
  Administered 2018-05-29 – 2018-05-31 (×6): via TOPICAL
  Filled 2018-05-29 (×2): qty 15

## 2018-05-29 MED ORDER — SACCHAROMYCES BOULARDII 250 MG PO CAPS
250.0000 mg | ORAL_CAPSULE | Freq: Two times a day (BID) | ORAL | Status: DC
  Administered 2018-05-29 – 2018-05-31 (×3): 250 mg via ORAL
  Filled 2018-05-29 (×6): qty 1

## 2018-05-29 MED ORDER — POTASSIUM CHLORIDE CRYS ER 20 MEQ PO TBCR
40.0000 meq | EXTENDED_RELEASE_TABLET | Freq: Once | ORAL | Status: AC
  Administered 2018-05-29: 40 meq via ORAL
  Filled 2018-05-29: qty 2

## 2018-05-29 MED ORDER — PEG-KCL-NACL-NASULF-NA ASC-C 100 G PO SOLR: 1.0000 | Freq: Once | ORAL | Status: DC

## 2018-05-29 MED ORDER — PEG-KCL-NACL-NASULF-NA ASC-C 100 G PO SOLR: 0.5000 | Freq: Once | ORAL | Status: DC

## 2018-05-29 MED ORDER — PEG 3350-KCL-NA BICARB-NACL 420 G PO SOLR
4000.0000 mL | Freq: Once | ORAL | Status: AC
  Administered 2018-05-29: 4000 mL via ORAL
  Filled 2018-05-29: qty 4000

## 2018-05-29 MED ORDER — PEG-KCL-NACL-NASULF-NA ASC-C 100 G PO SOLR
0.5000 | Freq: Once | ORAL | Status: DC
  Filled 2018-05-29: qty 1

## 2018-05-29 NOTE — Progress Notes (Signed)
   Courtesy note - Primary GI MD  [name] remains convinced his medications were causing his problems.  I suppose its possible he does have a C diff infection driving sxs but his PCR is negative.  He asks for a colostomy - admittedly sometimes used in perianal disease.  He has had anal fissure and stenosis/stricture when I last saw him so may need anal/rectal dilation for scope to pass.  May need pelvic imaging perhaps, ? If EUS would be helpful will defer to Dr. Ardis Hughs.  Overall it appears that his mentyal health is again interfering with his treatment as it has in past.   He was not able to see the psychiatrist today but that is important. He may be competent and have full capacity to make decisions  (I suspect so). However we will not be able to help him without medical treatment for his Crohn's - I told him this and have been telling him this for past few weeks.  Thanks to all helping.  [name], MD, [facility]

## 2018-05-29 NOTE — Progress Notes (Addendum)
[facility] Progress Note  CC:  Crohn's disease, bloody diarrhea   Subjective: He had 4 episodes of diarrhea from 11pm yesterday to this morning. Three out of 4 episodes he saw a moderate amount or red blood in the toilet water. He had lower abdominal pain yesterday relieved after he received Morphine. No abdominal pain at this time. He is tolerating clear liquids. Slightly nauseous at times, no vomiting. He is on Entyvio infusions Q 8 weeks but stated he had to reschedule his last infusion. His last Entyvio infusion was 11 weeks ago. He complains of increased anxiety, took Clonazepam 0.92m 1/2 tab twice during the night. Depression is controlled. Denies suicidal ideation.  Objective:  Vital signs in last 24 hours: Temp:  [98 F (36.7 C)-100.2 F (37.9 C)] 100.2 F (37.9 C) (03/30 0520) Pulse Rate:  [87-96] 92 (03/30 0520) Resp:  [16-17] 16 (03/30 0520) BP: (88-127)/(52-87) 109/58 (03/30 0520) SpO2:  [96 %-100 %] 96 % (03/30 0520) Weight:  [74.7 kg] 74.7 kg (03/29 1435) Last BM Date: 05/28/18 General:   Alert and oriented in NAD Heart:  Regular rate and rhythm; no murmurs Pulm: Lungs clear.  Abdomen:  Soft, central lower and LLQ tenderness without rebound or guarding, + BS x 4 quads. Rectal: peri anal/gluteal area with erythematous well demarcated borders most likely candidiasis, right gluteal posterior ? fistula with 3 erythematous raised areas/nodules no drainage expressed from fistula opening surrounding fistula area is firm and tender. Extremities:  Without edema. Neurologic:  Alert and  oriented x4;  grossly normal neurologically. Psych:  Alert and cooperative. Normal mood and affect.  Lab Results: Recent Labs    05/28/18 1008 05/29/18 0524  WBC 6.9 6.9  HGB 12.3* 10.5*  HCT 37.9* 33.5*  PLT 256 218   BMET Recent Labs    05/28/18 1008 05/29/18 0524  NA 133* 133*  K 2.6* 3.2*  CL 101 101  CO2 24 23  GLUCOSE 65* 55*  BUN <5* <5*  CREATININE  0.77 0.90  CALCIUM 7.5* 7.5*   LFT Recent Labs    05/28/18 1008  PROT 4.7*  ALBUMIN 2.2*  AST 22  ALT 19  ALKPHOS 121  BILITOT 0.7   PT/INR Recent Labs    05/28/18 1008  LABPROT 14.4  INR 1.1    Assessment / Plan: 1. 53y.o. male with Crohn's disease, perianal fistula on Entyvio (past Humira use). Admitted with bloody diarrhea. Abd/Pelvic CT 2/26 shows soft tissue thickness in the medial cecum indicating mild active Crohn's disease. Reports taking Prednisone for 8 yrs, last taper finished 4 days ago. His last Entyvio infusion was 11 weeks ago (Entyvio 3071mIV ordered Q 8 weeks).  -patient is afebrile, WBC 6.9 with + C. Diff toxin. Dr. GeCarlean Purlo verify date of next Entyvio infusion -check Entyvio (Vedolizumab) antibody which is a send out lab.  Test Code # [id number] red top tube. -If Vedolizumab antibody positive consider changing biologics to Stelara or Remicade (if not previously utilized)  -may need repeat abd/pelvic CT if abdominal pain persists or if fever or leukocytosis develops -anal fistula may require drainage -eventual colonoscopy   2. C.diff toxin positive. C. Diff antigen negative.  -continue Vanco 1256mo qid -Saccharomyces Boulardi 1 po  bid   3. Anemia secondary to Crohn's, hematochezia, dilutional component.   Hg 10.5 << 12.3. HCT 33.5 << 37.9.  B12 799. -check iron, iron saturation, TIBC and Ferritin next lab draw -monitor H/H daily -monitor for increasing  hematochezia -eventual colonoscopy   4. Hypokalemia K+ 3.2 -KCL replacement per hospitalist   5. Colon polyps. Colonoscopy 05/19/2017 by [name] showed > 50 polyps throughout the colon, one 72m ileal polyp, one 128mpolyp in the ascending colon, one 3cm polyp + attached by a stalk which was clipped and injected in the transverse colon, biopsies consistent with pseudopolyps. -eventual colonoscopy   6. AIH-PSC overlap, possible  cirrhosis. Alk phos 121. AST 22. ALT 19. No recent IgG level.   Abd/pelvic CT 2/26 [facility] for [facility] in intrahepatic duct dilatation within the left hepatic lobe, mild volume loss in the posterior right lobe at site of prior portal vein thrombosis -consider abdominal MRI/MRCP as inpatient, await further recommendations from  Dr. GeCarlean Purlater this afternoon.   7. Depression/Anxiety. Increased anxiety. Depression stable.  -psychiatry consult in process   8. Weight loss. Report 45 lb weight loss past 6 months     LOS: 1 day   Colleen M Kennedy-Smith  05/29/2018, 10:39 AM   ________________________________________________________________________  LeVelora HecklerI MD note:  I personally examined the patient, reviewed the data and agree with the assessment and plan described above.  Doing poorly for several weeks, maybe longer.  He has not had entyvio in 11 weeks (he is 3 weeks past-due).  C. Diff toxin + (but PCR negative).  Colonoscopy 1 year ago with multiple pseudopolyps removed (recall was to be one year).  CT scan last month suggested abnormal cecum and focal intrahepatic biliary duct changes; he was to have an MRI but did not.  His significant anxiety makes all of this harder to sort out.   I'm planning on colonoscopy for him tomorrow to get a good look at his colon, assess disease activity, check for pseudomembranes. Will also get very good look at what he calls an abscess (perianal) that he's had for 3 years.  Will check for entyvio antibodies but drug level must be low since he's late for an infusion by 3 weeks.  Might consider changing him to remicade, I don't think he's ever been on it.  He does tell me that humira wasn't working and that led to change to entyvio.  Not planning on MRI of his liver for now.  DaOwens LofflerMD [facility] Pager [phone]

## 2018-05-29 NOTE — Consult Note (Addendum)
[facility] Consult   Reason for Consult:  Depression Referring Physician:  Dr. Fayrene Helper Patient Identification: [name] MRN:  [id number] Principal Diagnosis: MDD (major depressive disorder), recurrent episode, moderate ([place]) Diagnosis:  Principal Problem:   Crohn's colitis, with fistula ([place]) Active Problems:   Essential hypertension   Major depressive disorder, recurrent episode, severe ([place])   Autoimmune hepatitis-PSC overlap   Chronic pain syndrome   Grover's disease   Abnormal CT scan, liver   Total Time spent with patient: 1 hour  Subjective:   [name] is a 53 y.o. male patient admitted with Crohn's colitis.  HPI:   Per chart review, patient was admitted with Crohn's. He does not want to resume steroids and prefers a colostomy at this time. He has a history of depression and multiple suicide attempts. He denies current SI but does report intermittent SI. He was previously taking Remeron.   Of note, he was last seen by the psychiatry consult service in 04/2012 for depression and Nortriptyline overdose. He also admitted to overdosing on Klonopin 3 days prior. He was involved with palliative care at this time and it was planned for him to continue care with palliative care.   On interview, [name] reports, "no one will listen to me" when discussing his ongoing problems with Crohn's.  He reports that his doctor would like for him to continue holding Prednisone since it worsened his GI symptoms.  He reports that he last took it 5 days ago.  He plans to undergo colonoscopy.  He reports poor sleep with multiple nighttime awakenings due to anxiety about his medical problems.  He reports poor appetite secondary to GI upset.  He stopped taking Remeron and Klonopin a week ago since the compounded formulation caused side effects.  He denies current SI, HI AVH.  Past Psychiatric History: Anxiety, depression and history of multiple suicide  attempts.  Risk to Self:  None. Denies SI.  Risk to Others:  None. Denies HI. Prior Inpatient Therapy:  Denies  Prior Outpatient Therapy:  He is followed by his PCP.   Past Medical History:  Past Medical History:  Diagnosis Date  . Allergy   . Anal fistula   . Anxiety   . 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 Autoimmune hepatitis ([place]) 01/19/2013  . Cancer of skin of neck   . Cataract   . Chronic mesenteric ischemia ([place])   . Chronic pain syndrome 07/09/2016  . Crohn's disease of small and large intestines ([place])    followed by dr Glendell Docker gessner  . Dairy product intolerance   . Diarrhea, functional   . Family history of adverse reaction to anesthesia   . Grover's disease    transient acantholytic dermatosis  . History of alcohol abuse   . History of basal cell carcinoma excision    2013 left leg  . History of Clostridium difficile    10/ 2014  . History of multiple concussions    x6   last one Jan 2017 per pt--  no residual  . History of substance abuse ([place])    quit 1997 per pt  . History of suicide attempt    05-18-2012  overdose /  failure to thrive  . Hypercholesterolemia   . Iron deficiency anemia due to chronic blood loss   . Major depression, recurrent, chronic ([place])   . Osteopenia   . Personal history of adenomatous colonic polyps 12/2010, 03/2012   12/2010 - 8  mm serrated adenoma of rectum  . Portal vein thrombosis 03/21/2015   right  . Primary sclerosing cholangitis    ? hepatitis overlap - liver bx x 2 and MRCP  . Seasonal allergies   . Substance abuse ([place]) 1997   Alcohol  . Vitamin A deficiency 05/23/2018  . Vitamin C deficiency 05/23/2018    Past Surgical History:  Procedure Laterality Date  . ABDOMINAL AORTAGRAM N/A 03/29/2012   Procedure: ABDOMINAL Maxcine Ham;  Surgeon: Serafina Mitchell, MD;  [facility] C[facility] LAB;  Service: Cardiovascular;  Laterality: N/A;  . ADENOIDECTOMY  age 65  . CATARACT EXTRACTION W/ INTRAOCULAR LENS  IMPLANT, BILATERAL  2009   . COLONOSCOPY  2001, 05/02/2003, 01/28/11   2012: Right colon Crohn's, rectal polyp  . COLONOSCOPY  03/31/2012   Procedure: COLONOSCOPY;  Surgeon: Jerene Bears, MD;  Location: [facility];  Service: Gastroenterology;  Laterality: N/A;  . ESOPHAGOGASTRODUODENOSCOPY  01/28/11   Normal  . FOOT SURGERY Right age 72  . MOHS SURGERY Left 11/2013   left ankle parakerotosis   . PERCUTANEOUS LIVER BIOPSY  2007 and 2008  . PILONIDAL CYST EXCISION  age 25  . PLACEMENT OF SETON N/A 11/06/2015   Procedure: PLACEMENT OF SETON;  Surgeon: [name], MD;  Location[facility];  Service: General;  Laterality: N/A;  . UPPER GASTROINTESTINAL ENDOSCOPY     Family History:  Family History  Problem Relation Age of Onset  . Heart disease Father   . Thyroid cancer Father   . Allergies Father   . Clotting disorder Father   . Breast cancer Mother   . Stomach cancer Mother   . Colon cancer Neg Hx   . Esophageal cancer Neg Hx   . Rectal cancer Neg Hx    Family Psychiatric  History: Denies  Social History:  Social History   Substance and Sexual Activity  Alcohol Use No  . Alcohol/week: 0.0 standard drinks     Social History   Substance and Sexual Activity  Drug Use No   Comment: QUIT USING DRUGS IN 1997    Social History   Socioeconomic History  . Marital status: Single    Spouse name: Not on file  . Number of children: 1  . Years of education: Not on file  . Highest education level: Not on file  Occupational History  . Occupation: Disability   Social Needs  . Financial resource strain: Not on file  . Food insecurity:    Worry: Not on file    Inability: Not on file  . Transportation needs:    Medical: Not on file    Non-medical: Not on file  Tobacco Use  . Smoking status: Former Smoker    Packs/day: 1.00    Years: 15.00    Pack years: 15.00    Types: Cigarettes    Last attempt to quit: 03/28/1997    Years since quitting: 21.1  . Smokeless tobacco: Never Used   Substance and Sexual Activity  . Alcohol use: No    Alcohol/week: 0.0 standard drinks  . Drug use: No    Comment: QUIT USING DRUGS IN 1997  . Sexual activity: Not Currently  Lifestyle  . Physical activity:    Days per week: Not on file    Minutes per session: Not on file  . Stress: Not on file  Relationships  . Social connections:    Talks on phone: Not on file    Gets together: Not on file  Attends religious service: Not on file    Active member of club or organization: Not on file    Attends meetings of clubs or organizations: Not on file    Relationship status: Not on file  Other Topics Concern  . Not on file  Social History Narrative   Single, history of substance abuse in recovery   Previously occupied on medical disability   1 child    Elderly parents involved and he helps them   1 brother in [place]   Additional Social History: He lives at home with his 32 y/o parents. He denies alcohol or illicit substance use. He has been sober for 23 years. He receives disability.     Allergies:   Allergies  Allergen Reactions  . Asacol [Mesalamine] Diarrhea    abd pain  . Flagyl [Metronidazole] Diarrhea    abd pain  . Azathioprine Diarrhea    abd pain  . Gluten Meal Diarrhea  . Mycophenolate Mofetil Diarrhea    abd pain  . Other     NUTS; constipation  . Wheat Bran Diarrhea    Constipation; flatulence; abd pain  . Tape Itching and Rash    Please use "paper" tape    Labs:  Results for orders placed or performed during the hospital encounter of 05/28/18 (from the past 48 hour(s))  Urinalysis, Routine w reflex microscopic     Status: Abnormal   Collection Time: 05/28/18  9:35 AM  Result Value Ref Range   Color, Urine YELLOW YELLOW   APPearance CLEAR CLEAR   Specific Gravity, Urine 1.005 1.005 - 1.030   pH 6.0 5.0 - 8.0   Glucose, UA NEGATIVE NEGATIVE mg/dL   Hgb urine dipstick NEGATIVE NEGATIVE   Bilirubin Urine NEGATIVE NEGATIVE   Ketones, ur  5 (A) NEGATIVE mg/dL   Protein, ur NEGATIVE NEGATIVE mg/dL   Nitrite NEGATIVE NEGATIVE   Leukocytes,Ua NEGATIVE NEGATIVE    Comment: Performed at [facility], [place] [address], [place], [place] [address]  Comprehensive metabolic panel     Status: Abnormal   Collection Time: 05/28/18 10:08 AM  Result Value Ref Range   Sodium 133 (L) 135 - 145 mmol/L   Potassium 2.6 (LL) 3.5 - 5.1 mmol/L    Comment: CRITICAL RESULT CALLED TO, READ BACK BY AND VERIFIED WITH: SMITH,T RN [phone] COVINGTON,N    Chloride 101 98 - 111 mmol/L   CO2 24 22 - 32 mmol/L   Glucose, Bld 65 (L) 70 - 99 mg/dL   BUN <5 (L) 6 - 20 mg/dL   Creatinine, Ser 0.77 0.61 - 1.24 mg/dL   Calcium 7.5 (L) 8.9 - 10.3 mg/dL   Total Protein 4.7 (L) 6.5 - 8.1 g/dL   Albumin 2.2 (L) 3.5 - 5.0 g/dL   AST 22 15 - 41 U/L   ALT 19 0 - 44 U/L   Alkaline Phosphatase 121 38 - 126 U/L   Total Bilirubin 0.7 0.3 - 1.2 mg/dL   GFR calc non Af Amer >60 >60 mL/min   GFR calc Af Amer >60 >60 mL/min   Anion gap 8 5 - 15    Comment: Performed at [facility], [place] [address] [place],  [address]  Lipase, blood     Status: None   Collection Time: 05/28/18 10:08 AM  Result Value Ref Range   Lipase 18 11 - 51 U/L    Comment: Performed at [facility], [place] [address] [place],  [place] [address]  CBC with Differential     Status: Abnormal   Collection Time: 05/28/18 10:08 AM  Result Value Ref Range   WBC 6.9 4.0 - 10.5 K/uL   RBC 4.44 4.22 - 5.81 MIL/uL   Hemoglobin 12.3 (L) 13.0 - 17.0 g/dL   HCT 37.9 (L) 39.0 - 52.0 %   MCV 85.4 80.0 - 100.0 fL   MCH 27.7 26.0 - 34.0 pg   MCHC 32.5 30.0 - 36.0 g/dL   RDW 14.3 11.5 - 15.5 %   Platelets 256 150 - 400 K/uL   nRBC 0.0 0.0 - 0.2 %   Neutrophils Relative % 55 %   Neutro Abs 3.9 1.7 - 7.7 K/uL   Lymphocytes Relative 22 %   Lymphs Abs 1.5 0.7 - 4.0 K/uL   Monocytes Relative 19 %   Monocytes Absolute 1.3 (H) 0.1 - 1.0 K/uL    Eosinophils Relative 2 %   Eosinophils Absolute 0.2 0.0 - 0.5 K/uL   Basophils Relative 1 %   Basophils Absolute 0.0 0.0 - 0.1 K/uL   Immature Granulocytes 1 %   Abs Immature Granulocytes 0.04 0.00 - 0.07 K/uL    Comment: Performed at [facility], [place] [address] [place], [place] [address]  Protime-INR     Status: None   Collection Time: 05/28/18 10:08 AM  Result Value Ref Range   Prothrombin Time 14.4 11.4 - 15.2 seconds   INR 1.1 0.8 - 1.2    Comment: (NOTE) INR goal varies based on device and disease states. Performed at [facility], [place] [address], [place], [place] [address]   Sedimentation rate     Status: Abnormal   Collection Time: 05/28/18 10:08 AM  Result Value Ref Range   Sed Rate 50 (H) 0 - 16 mm/hr    Comment: Performed at [facility], [place] [address] [place], [place] [address]  C-reactive protein     Status: Abnormal   Collection Time: 05/28/18 10:08 AM  Result Value Ref Range   CRP 11.4 (H) <1.0 mg/dL    Comment: Performed at [facility], [place] [address] [place], [place] [address]  Cortisol     Status: None   Collection Time: 05/28/18 10:08 AM  Result Value Ref Range   Cortisol, Plasma 4.5 ug/dL    Comment: (NOTE) AM    6.7 - 22.6 ug/dL PM   <10.0       ug/dL Performed at [place] [address], [place], [place] [address]   Magnesium     Status: None   Collection Time: 05/28/18 10:08 AM  Result Value Ref Range   Magnesium 1.7 1.7 - 2.4 mg/dL    Comment: Performed at [facility], [place] [address], [place], [place] [address]  Phosphorus     Status: None   Collection Time: 05/28/18 10:08 AM  Result Value Ref Range   Phosphorus 3.0 2.5 - 4.6 mg/dL    Comment: Performed at [facility], [place] [address], [place], [place] [address]  Vitamin B12     Status: None   Collection Time: 05/28/18 10:08 AM  Result Value Ref Range   Vitamin  B-12 799 180 - 914 pg/mL    Comment: (NOTE) This assay is not validated for testing neonatal or myeloproliferative syndrome specimens for Vitamin B12 levels. Performed at [facility], [place] [address], [place], [place] [address]   TSH  Status: None   Collection Time: 05/28/18 10:08 AM  Result Value Ref Range   TSH 4.496 0.350 - 4.500 uIU/mL    Comment: Performed by a 3rd Generation assay with a functional sensitivity of <=0.01 uIU/mL. Performed at [facility], [place] [address] [place], [place] [address]   Lactic acid, plasma     Status: None   Collection Time: 05/28/18 10:10 AM  Result Value Ref Range   Lactic Acid, Venous 1.0 0.5 - 1.9 mmol/L    Comment: Performed at [facility], [place] [address] [place], [place] [address]  Lactic acid, plasma     Status: None   Collection Time: 05/28/18 12:10 PM  Result Value Ref Range   Lactic Acid, Venous 1.0 0.5 - 1.9 mmol/L    Comment: Performed at [facility], [place] [address] [place], [place] [address]  C difficile quick scan w PCR reflex     Status: Abnormal   Collection Time: 05/28/18  3:08 PM  Result Value Ref Range   C Diff antigen NEGATIVE NEGATIVE   C Diff toxin POSITIVE (A) NEGATIVE    Comment: CRITICAL RESULT CALLED TO, READ BACK BY AND VERIFIED WITH: HUGHEY,C RN [phone] COVINGTON,N    C Diff interpretation Results are indeterminate. See PCR results.     Comment: Performed at [facility], [place] [address] [place], [place] [address]  Basic metabolic panel     Status: Abnormal   Collection Time: 05/29/18  5:24 AM  Result Value Ref Range   Sodium 133 (L) 135 - 145 mmol/L   Potassium 3.2 (L) 3.5 - 5.1 mmol/L    Comment: REPEATED TO VERIFY DELTA CHECK NOTED NO VISIBLE HEMOLYSIS    Chloride 101 98 - 111 mmol/L   CO2 23 22 - 32 mmol/L   Glucose, Bld 55 (L) 70 - 99 mg/dL   BUN <5 (L) 6 - 20 mg/dL   Creatinine, Ser 0.90 0.61 - 1.24 mg/dL    Calcium 7.5 (L) 8.9 - 10.3 mg/dL   GFR calc non Af Amer >60 >60 mL/min   GFR calc Af Amer >60 >60 mL/min   Anion gap 9 5 - 15    Comment: Performed at [facility], [place] [address] [place], [place] [address]  CBC     Status: Abnormal   Collection Time: 05/29/18  5:24 AM  Result Value Ref Range   WBC 6.9 4.0 - 10.5 K/uL   RBC 3.83 (L) 4.22 - 5.81 MIL/uL   Hemoglobin 10.5 (L) 13.0 - 17.0 g/dL   HCT 33.5 (L) 39.0 - 52.0 %   MCV 87.5 80.0 - 100.0 fL   MCH 27.4 26.0 - 34.0 pg   MCHC 31.3 30.0 - 36.0 g/dL   RDW 14.2 11.5 - 15.5 %   Platelets 218 150 - 400 K/uL   nRBC 0.0 0.0 - 0.2 %    Comment: Performed at [facility], [place] [address], [place], [place] [address]    Current Facility-Administered Medications  Medication Dose Route Frequency Provider Last Rate Last Dose  . acetaminophen (TYLENOL) tablet 650 mg  650 mg Oral Q6H PRN Karmen Bongo, MD       Or  . acetaminophen (TYLENOL) suppository 650 mg  650 mg Rectal Q6H PRN Karmen Bongo, MD      . clonazePAM Bobbye [place]) tablet 0.5 mg  0.5 mg Oral QHS PRN Karmen Bongo, MD   0.5 mg at 05/28/18 2310  .  HYDROcodone-acetaminophen (NORCO/VICODIN) 5-325 MG per tablet 1-2 tablet  1-2 tablet Oral Q6H PRN Karmen Bongo, MD      . lactated ringers infusion   Intravenous Continuous Karmen Bongo, MD 75 mL/hr at 05/29/18 0525    . morphine 2 MG/ML injection 2 mg  2 mg Intravenous Q2H PRN Karmen Bongo, MD   2 mg at 05/28/18 1643  . ondansetron (ZOFRAN) tablet 4 mg  4 mg Oral Q6H PRN Karmen Bongo, MD       Or  . ondansetron [facility]) injection 4 mg  4 mg Intravenous Q6H PRN Karmen Bongo, MD      . saccharomyces boulardii (FLORASTOR) capsule 250 mg  250 mg Oral BID Carl Best M, NP      . vancomycin (VANCOCIN) 50 mg/mL oral solution 125 mg  125 mg Oral QID Karmen Bongo, MD   125 mg at 05/29/18 1035    Musculoskeletal: Strength & Muscle Tone: Generalized weakness. Gait & Station:  UTA since patient is lying in bed. Patient leans: N/A  Psychiatric Specialty Exam: Physical Exam  Nursing note and vitals reviewed. Constitutional: He is oriented to person, place, and time. He appears well-developed and well-nourished.  HENT:  Head: Normocephalic and atraumatic.  Neck: Normal range of motion.  Respiratory: Effort normal.  Musculoskeletal: Normal range of motion.  Neurological: He is alert and oriented to person, place, and time.  Psychiatric: His speech is normal and behavior is normal. Judgment and thought content normal. His mood appears anxious. Cognition and memory are normal. He exhibits a depressed mood.    Review of Systems  Gastrointestinal: Positive for diarrhea. Negative for constipation, nausea and vomiting.  Psychiatric/Behavioral: Positive for depression. Negative for hallucinations, substance abuse and suicidal ideas. The patient is nervous/anxious and has insomnia.   All other systems reviewed and are negative.   Blood pressure (!) 109/58, pulse 92, temperature 100.2 F (37.9 C), temperature source Oral, resp. rate 16, height 6' 2"  (1.88 m), weight 74.7 kg, SpO2 96 %.Body mass index is 21.14 kg/m.  General Appearance: Fairly Groomed, middle aged, Caucasian male, wearing a hospital gown who is lying in bed. NAD.   Eye Contact:  Good  Speech:  Clear and Coherent and Normal Rate  Volume:  Normal  Mood:  Anxious and Depressed  Affect:  Congruent  Thought Process:  Goal Directed, Linear and Descriptions of Associations: Intact  Orientation:  Full (Time, Place, and Person)  Thought Content:  Logical  Suicidal Thoughts:  No  Homicidal Thoughts:  No  Memory:  Immediate;   Good Recent;   Good Remote;   Good  Judgement:  Fair  Insight:  Fair  Psychomotor Activity:  Normal  Concentration:  Concentration: Good and Attention Span: Good  Recall:  Good  Fund of Knowledge:  Good  Language:  Good  Akathisia:  No  Handed:  Right  AIMS (if indicated):   N/A   Assets:  Communication Skills Desire for Improvement Financial Resources/Insurance Housing Resilience  ADL's:  Impaired  Cognition:  WNL  Sleep:   Poor   Assessment:  [name] is a 53 y.o. male who was admitted with Crohn's. Psychiatry was consulted for management of anxiety and depression. He reports worsening mood and anxiety due to ongoing medical problems. He has not been taking Remeron or Klonopin for the past week due to difficulty with PO intake. He is agreeable to restarting Remeron for depression, anxiety and poor sleep. He denies SI, HI or AVH. He does not warrant  inpatient psychiatric hospitalization at this time.   Treatment Plan Summary: -Restart Remeron 30 mg daily. Please provide sublingual formulation since patient reports difficulty with swallowing pills at this time.  -Recommend obtaining an EKG to monitor for QTc prolongation.  -Patient should continue to follow up with his PCP for further medication management.  -Psychiatry will sign off on patient at this time. Please consult psychiatry again as needed.   Disposition: No evidence of imminent risk to self or others at present.   Patient does not meet criteria for psychiatric inpatient admission.  Faythe Dingwall, DO 05/30/2018 11:19 AM

## 2018-05-29 NOTE — Progress Notes (Addendum)
PROGRESS NOTE    [name]  LDJ:[id number] DOB: [birth year] DOA: 05/28/2018 PCP: [name], MD   Brief Narrative:  [name] is [name] 53 y.o. male with medical history significant of depression; substance abuse; Crohn's; autoimmune hepatitis; and chronic pain syndrome presenting with rectal bleeding. He has had [name] gradual decline.  He struggled through the end of the year last year with deteriorating conditions, more blood and more gas.  By Christmas, things weren't going well.  He started with bland diet and trying to calm down the flare.  "But I failed.  I took 3 months to do it and I failed."  He eliminated vitamins and most of his medicines and it still didn't get better.  He has been tapering down off prednisone since January - he thought it was making his stomach hurt and making him bleed more, and he didn't think it was helping his digestion so he was trying to "eliminate an irritant."  When this happened several years ago, he was referred to hospice and went to inpatient hospice and "they had to throw me out of hospice because I wouldn't die."  No fever.  He slowly tapered off prednisone off 2 weeks and thinks he didn't have withdrawal but he noticed feeling [name] little hot after coming off of it (not febrile, just "hot").  He would have cold chills before he would pass blood.  Last night, he had about 7 episodes in Wakisha Alberts 2 hour period.  He was nauseated, had light-headedness, had difficulty navigating back to his bedroom.  He acknowledges ongoing depression.  He has had multiple suicide attempts.  He reports that he was addicted to drugs and alcohol for 15 years and stopped for 2 years before being diagnosed with Crohn's.  Both ways were horrible, but this actually makes him feel worse.  He denies active SI/HI but does have suicidal thoughts.  Assessment & Plan:   Principal Problem:   Crohn's colitis, with fistula ([name]) Active Problems:   Essential hypertension   Major  depressive disorder, recurrent episode, severe ([name])   Autoimmune hepatitis-PSC overlap   Chronic pain syndrome   Grover's disease   Abnormal CT scan, liver   Crohn's -Patient with h/o Crohn's, not on chronic controlling medications at this time and has weaned himself off steroids -He has failed outpatient management and presents for GI consultation and assistance -Presented with diarrhea and weight loss over last few months -Will give gentle IVF hydration and patient will be on clear liquids for now while formulating [name] plan with GI -Pain control with low-dose morphine and Vicodin (has h/o polysubstance so will need to monitor this closely) -C diff toxin positive, but toxigenic C diff PCR negative.  On vancomycin.  Fecal calprotectin pending.  MDD   Anxiety -Patient with h/o depression with suicidal ideation - Per admitting provider, he continues to have suicidal thoughts but denied plan or the energy to execute Yaneli Keithley plan -He has weaned himself off medications (most recently Remeron) -He denied desire to harm himself to me -Psych c/s for severe depression with suicidal thoughts.  GI also requested c/s as he appears to have significant anxiety affecting his treatment of his crohn's.    Autoimmune hepatitis-PSC -Normal LFTs  -Followed by GI -Previously scheduled for MRI, may need while here but will defer to GI  Chronic pain with h/o polysubstance abuse -Continue Norco and add prn IV morphine -Monitor for overuse or other behaviors concerning for drug-seeking  Grover's disease -The  current rash on his LE may be c/w Grover's, although this usually occurs on the abdomen  -It also could be eczema -Will treat with Eucerin:TAC  HTN -He does not appear to be taking medications for this issue at this time  DVT prophylaxis: SCD Code Status: DNR Family Communication: none at bedside Disposition Plan: pending further evaluation by GI   Consultants:   GI  Psych  Procedures:    none  Antimicrobials:  Anti-infectives (From admission, onward)   Start     Dose/Rate Route Frequency Ordered Stop   05/28/18 1845  vancomycin (VANCOCIN) 50 mg/mL oral solution 125 mg     125 mg Oral 4 times daily 05/28/18 1824 06/07/18 1759     Subjective: Diarrhea recently. Feels generally poorly. Denies thoughts of harming himself.  Objective: Vitals:   05/28/18 1330 05/28/18 1435 05/28/18 2235 05/29/18 0520  BP: 114/81 125/80 120/71 (!) 109/58  Pulse: 94 89 95 92  Resp: 17 16 16 16   Temp: 98.2 F (36.8 C) 98.3 F (36.8 C) 99.1 F (37.3 C) 100.2 F (37.9 C)  TempSrc: Oral Oral Oral Oral  SpO2: 99% 99% 98% 96%  Weight:  74.7 kg    Height:  6' 2"  (1.88 m)      Intake/Output Summary (Last 24 hours) at 05/29/2018 1033 Last data filed at 05/29/2018 0159 Gross per 24 hour  Intake 2355.72 ml  Output --  Net 2355.72 ml   Filed Weights   05/28/18 0923 05/28/18 1435  Weight: 77.4 kg 74.7 kg    Examination:  General exam: Appears calm and comfortable  Respiratory system: Clear to auscultation. Respiratory effort normal. Cardiovascular system: S1 & S2 heard, RRR.  Gastrointestinal system: Abdomen is nondistended, soft and mildly ttp.  Central nervous system: Alert and oriented. No focal neurological deficits. Extremities: Symmetric 5 x 5 power. Skin: No rashes, lesions or ulcers Psychiatry: Judgement and insight appear normal. Mood & affect appropriate.     Data Reviewed: I have personally reviewed following labs and imaging studies  CBC: Recent Labs  Lab 05/28/18 1008 05/29/18 0524  WBC 6.9 6.9  NEUTROABS 3.9  --   HGB 12.3* 10.5*  HCT 37.9* 33.5*  MCV 85.4 87.5  PLT 256 450   Basic Metabolic Panel: Recent Labs  Lab 05/28/18 1008 05/29/18 0524  NA 133* 133*  K 2.6* 3.2*  CL 101 101  CO2 24 23  GLUCOSE 65* 55*  BUN <5* <5*  CREATININE 0.77 0.90  CALCIUM 7.5* 7.5*  MG 1.7  --   PHOS 3.0  --    GFR: Estimated Creatinine Clearance: 98  mL/min (by C-G formula based on SCr of 0.9 mg/dL). Liver Function Tests: Recent Labs  Lab 05/28/18 1008  AST 22  ALT 19  ALKPHOS 121  BILITOT 0.7  PROT 4.7*  ALBUMIN 2.2*   Recent Labs  Lab 05/28/18 1008  LIPASE 18   No results for input(s): AMMONIA in the last 168 hours. Coagulation Profile: Recent Labs  Lab 05/28/18 1008  INR 1.1   Cardiac Enzymes: No results for input(s): CKTOTAL, CKMB, CKMBINDEX, TROPONINI in the last 168 hours. BNP (last 3 results) No results for input(s): PROBNP in the last 8760 hours. HbA1C: No results for input(s): HGBA1C in the last 72 hours. CBG: No results for input(s): GLUCAP in the last 168 hours. Lipid Profile: No results for input(s): CHOL, HDL, LDLCALC, TRIG, CHOLHDL, LDLDIRECT in the last 72 hours. Thyroid Function Tests: Recent Labs    05/28/18 1008  TSH 4.496   Anemia Panel: Recent Labs    05/28/18 1008  VITAMINB12 799   Sepsis Labs: Recent Labs  Lab 05/28/18 1010 05/28/18 1210  LATICACIDVEN 1.0 1.0    Recent Results (from the past 240 hour(s))  C difficile quick scan w PCR reflex     Status: Abnormal   Collection Time: 05/28/18  3:08 PM  Result Value Ref Range Status   C Diff antigen NEGATIVE NEGATIVE Final   C Diff toxin POSITIVE (Iley Deignan) NEGATIVE Final    Comment: CRITICAL RESULT CALLED TO, READ BACK BY AND VERIFIED WITH: HUGHEY,C RN [phone] COVINGTON,N    C Diff interpretation Results are indeterminate. See PCR results.  Final    Comment: Performed at [facility], [place] [address], [place],  [address]         Radiology Studies: No results found.      Scheduled Meds:  vancomycin  125 mg Oral QID   Continuous Infusions:  lactated ringers 75 mL/hr at 05/29/18 0525     LOS: 1 day    Time spent: over 30 min    Fayrene Helper, MD [facility] Pager AMION  If 7PM-7AM, please contact night-coverage [email or web address] Password [facility] 05/29/2018, 10:33 AM

## 2018-05-29 NOTE — Progress Notes (Signed)
Unable to see patient today due to multiple somatic complaints. He requested for this notewriter to come back at another time. Will attempt to see patient again tomorrow.   Buford Dresser, DO 05/29/18 2:07 PM

## 2018-05-29 NOTE — Progress Notes (Addendum)
Initial Nutrition Assessment  INTERVENTION:   Monitor magnesium, potassium, and phosphorus daily for at least 3 days, MD to replete as needed, as pt is at risk for refeeding syndrome given suspected malnutrition, poor PO intakes for at least 3-4 months, weight loss, low K levels.  Recommend supplementation of: -Vitamin C 500 mg BID -Vitamin A 10,000 IU every other day -Vitamin D 1,000 IU daily  -Provide Boost Breeze po daily each supplement provides 250 kcal and 9 grams of protein -Provide Unjury Chicken Soup BID with meals, each provides 100 kcal and 21g protein. -Provide Multivitamin with minerals daily  NUTRITION DIAGNOSIS:   Inadequate oral intake related to (depression, limited food acceptance) as evidenced by per patient/family report  GOAL:   Patient will meet greater than or equal to 90% of their needs  MONITOR:   PO intake, Supplement acceptance, Labs, Weight trends, I & O's  REASON FOR ASSESSMENT:   Malnutrition Screening Tool    ASSESSMENT:    53 y.o. male with medical history significant of depression; substance abuse; Crohn's; autoimmune hepatitis; and chronic pain syndrome presenting with rectal bleeding.  Admitted for Crohn's exacerbation.  **RD working remotely**  Patient currently on CLD for rectal bleeding. Per GI note, pt expected to have a colonoscopy tomorrow. Per chart review pt has a history of limited food acceptance, possibly food restriction related to depression and in response to managing Crohn's symptoms. Pt was hospitalized in 2014 and started on tube feeding at that time. Pt was severely limiting his food options at that time.    Per MST screen, pt has been following a gluten-free diet. Per MD note, pt has not left the house in ~3 months, states he is "eating too much" however is only eating chicken and rice cakes.   RD suspects some degree of malnutrition as well as micronutrient deficiency as nutrition labs were checked on 3/19 which revealed  pt is deficient in Vitamin C, A, & D. Pt is also at risk of refeeding syndrome given suspected poor PO intakes for months.  RD to order supplements while on clear liquid diet. Unjury Chicken Soup and [employer], RN to notify RD if pt refuses supplements.  Pt has reported ~40 lb weight loss over the past 3-4 months. Per weight records, pt has lost 46 lb since 08/15/17 (21% wt loss x 9 months, significant for time frame).  *Noted 3/19 labs revealed Vitamin C, D and  A deficiency. Labs reviewed: Low Na, K  Medications: Electrolyte solution, K-DUR tablet once, Florastor capsule BID, Lactated Ringers  NUTRITION - FOCUSED PHYSICAL EXAM:  Unable to perform d/t department requirement to work remotely.  Diet Order:   Diet Order            Diet NPO time specified  Diet effective midnight        Diet clear liquid Room service appropriate? Yes; Fluid consistency: Thin  Diet effective now              EDUCATION NEEDS:   Not appropriate for education at this time  Skin:  Skin Assessment: Reviewed RN Assessment  Last BM:  3/29  Height:   Ht Readings from Last 1 Encounters:  05/28/18 6' 2"  (1.88 m)    Weight:   Wt Readings from Last 1 Encounters:  05/28/18 74.7 kg    Ideal Body Weight:  86.3 kg  BMI:  Body mass index is 21.14 kg/m.  Estimated Nutritional Needs:   Kcal:  1900-2100  Protein:  90-100g  Fluid:  2.1L/day  Clayton Bibles, MS, RD, LDN [place] [facility] Pager: [phone] After Hours Pager: [phone]

## 2018-05-29 NOTE — Anesthesia Preprocedure Evaluation (Signed)
Anesthesia Evaluation  Patient identified by MRN, date of birth, ID band Patient awake    Reviewed: Allergy & Precautions, NPO status , Patient's Chart, lab work & pertinent test results  History of Anesthesia Complications (+) Family history of anesthesia reaction  Airway Mallampati: I  TM Distance: >3 FB Neck ROM: Full    Dental   Pulmonary former smoker,    Pulmonary exam normal        Cardiovascular hypertension, Pt. on medications Normal cardiovascular exam     Neuro/Psych PSYCHIATRIC DISORDERS Anxiety Depression negative neurological ROS     GI/Hepatic negative GI ROS, (+)     substance abuse  alcohol use, H/O Crohns Disease on Prednisone 68m per day   Endo/Other  negative endocrine ROS  Renal/GU   negative genitourinary   Musculoskeletal  (+) Arthritis , Osteoarthritis,    Abdominal   Peds negative pediatric ROS (+)  Hematology  (+) Blood dyscrasia, anemia ,   Anesthesia Other Findings   Reproductive/Obstetrics negative OB ROS                             Anesthesia Physical  Anesthesia Plan  ASA: III  Anesthesia Plan: MAC   Post-op Pain Management:    Induction: Intravenous  PONV Risk Score and Plan: 1  Airway Management Planned: Nasal Cannula and Mask  Additional Equipment:   Intra-op Plan:   Post-operative Plan:   Informed Consent: I have reviewed the patients History and Physical, chart, labs and discussed the procedure including the risks, benefits and alternatives for the proposed anesthesia with the patient or authorized representative who has indicated his/her understanding and acceptance.       Plan Discussed with: CRNA, Surgeon and Anesthesiologist  Anesthesia Plan Comments:         Anesthesia Quick Evaluation

## 2018-05-30 ENCOUNTER — Encounter (HOSPITAL_COMMUNITY)
Admission: EM | Disposition: A | Discharge status: Home / Self Care | Payer: Self-pay | Source: Home / Self Care | Attending: Family Medicine

## 2018-05-30 LAB — COMPREHENSIVE METABOLIC PANEL
ALT: 18 U/L (ref 0–44)
AST: 24 U/L (ref 15–41)
Albumin: 2 g/dL — ABNORMAL LOW (ref 3.5–5.0)
Alkaline Phosphatase: 105 U/L (ref 38–126)
Anion gap: 9 (ref 5–15)
BUN: <5 mg/dL — ABNORMAL LOW (ref 6–20)
CO2: 22 mmol/L (ref 22–32)
Calcium: 7.8 mg/dL — ABNORMAL LOW (ref 8.9–10.3)
Chloride: 103 mmol/L (ref 98–111)
Creatinine, Ser: 0.66 mg/dL (ref 0.61–1.24)
GFR calc Af Amer: >60 mL/min (ref >60)
GFR calc non Af Amer: >60 mL/min (ref >60)
Glucose, Bld: 38 mg/dL — CL (ref 70–99)
POTASSIUM: 3.2 mmol/L — AB (ref 3.5–5.1)
SODIUM: 134 mmol/L — AB (ref 135–145)
Total Bilirubin: 0.7 mg/dL (ref 0.3–1.2)
Total Protein: 4.3 g/dL — ABNORMAL LOW (ref 6.5–8.1)

## 2018-05-30 LAB — CBC
HCT: 34.1 % — ABNORMAL LOW (ref 39.0–52.0)
HEMOGLOBIN: 10.9 g/dL — AB (ref 13.0–17.0)
MCH: 27.7 pg (ref 26.0–34.0)
MCHC: 32 g/dL (ref 30.0–36.0)
MCV: 86.5 fL (ref 80.0–100.0)
Platelets: 201 10*3/uL (ref 150–400)
RBC: 3.94 MIL/uL — ABNORMAL LOW (ref 4.22–5.81)
RDW: 14.1 % (ref 11.5–15.5)
WBC: 4 10*3/uL (ref 4.0–10.5)
nRBC: 0 % (ref 0.0–0.2)

## 2018-05-30 LAB — GLUCOSE, CAPILLARY
GLUCOSE-CAPILLARY: 64 mg/dL — AB (ref 70–99)
Glucose-Capillary: 130 mg/dL — ABNORMAL HIGH (ref 70–99)
Glucose-Capillary: 63 mg/dL — ABNORMAL LOW (ref 70–99)
Glucose-Capillary: 78 mg/dL (ref 70–99)
Glucose-Capillary: 70 mg/dL (ref 70–99)
Glucose-Capillary: 112 mg/dL — ABNORMAL HIGH (ref 70–99)

## 2018-05-30 LAB — IRON AND TIBC
Iron: 14 ug/dL — ABNORMAL LOW (ref 45–182)
Saturation Ratios: 12 % — ABNORMAL LOW (ref 17.9–39.5)
TIBC: 119 ug/dL — ABNORMAL LOW (ref 250–450)
UIBC: 105 ug/dL

## 2018-05-30 LAB — FERRITIN: Ferritin: 293 ng/mL (ref 24–336)

## 2018-05-30 LAB — PHOSPHORUS: Phosphorus: 3.6 mg/dL (ref 2.5–4.6)

## 2018-05-30 LAB — VITAMIN B1: Vitamin B1 (Thiamine): 102.7 nmol/L (ref 66.5–200.0)

## 2018-05-30 LAB — MAGNESIUM: Magnesium: 1.9 mg/dL (ref 1.7–2.4)

## 2018-05-30 LAB — CALPROTECTIN, FECAL: Calprotectin, Fecal: 3049 ug/g — ABNORMAL HIGH (ref 0–120)

## 2018-05-30 SURGERY — CANCELLED PROCEDURE
Anesthesia: Monitor Anesthesia Care

## 2018-05-30 MED ORDER — MIRTAZAPINE 30 MG PO TBDP
30.0000 mg | ORAL_TABLET | Freq: Every day | ORAL | Status: DC
  Filled 2018-05-30 (×2): qty 1

## 2018-05-30 MED ORDER — CETAPHIL MOISTURIZING EX LOTN
TOPICAL_LOTION | Freq: Two times a day (BID) | CUTANEOUS | Status: DC
  Administered 2018-05-30: 12:00:00 via TOPICAL
  Filled 2018-05-30 (×2): qty 473

## 2018-05-30 MED ORDER — TRIAMCINOLONE ACETONIDE 0.1 % EX CREA
TOPICAL_CREAM | Freq: Two times a day (BID) | CUTANEOUS | Status: DC
  Administered 2018-05-30 – 2018-05-31 (×4): via TOPICAL
  Filled 2018-05-30 (×2): qty 15

## 2018-05-30 MED ORDER — DEXTROSE 50 % IV SOLN
25.0000 g | INTRAVENOUS | Status: AC
  Administered 2018-05-30: 25 g via INTRAVENOUS

## 2018-05-30 MED ORDER — POLYETHYLENE GLYCOL 3350 17 GM/SCOOP PO POWD
1.0000 | Freq: Once | ORAL | Status: AC
  Administered 2018-05-31: 255 g via ORAL
  Filled 2018-05-30: qty 255

## 2018-05-30 MED ORDER — DEXTROSE 50 % IV SOLN
INTRAVENOUS | Status: AC
  Filled 2018-05-30: qty 50

## 2018-05-30 MED ORDER — DIPHENHYDRAMINE HCL 25 MG PO CAPS: 25.0000 mg | ORAL_CAPSULE | Freq: Four times a day (QID) | ORAL | Status: DC | PRN

## 2018-05-30 MED ORDER — MIRTAZAPINE 15 MG PO TBDP: 7.5000 mg | ORAL_TABLET | Freq: Every day | ORAL | Status: DC

## 2018-05-30 MED ORDER — POTASSIUM CHLORIDE CRYS ER 20 MEQ PO TBCR
40.0000 meq | EXTENDED_RELEASE_TABLET | ORAL | Status: AC
  Administered 2018-05-30 (×2): 40 meq via ORAL
  Filled 2018-05-30 (×2): qty 2

## 2018-05-30 MED ORDER — PROPOFOL 10 MG/ML IV BOLUS
INTRAVENOUS | Status: AC
  Filled 2018-05-30: qty 60

## 2018-05-30 MED ORDER — DEXTROSE IN LACTATED RINGERS 5 % IV SOLN
INTRAVENOUS | Status: DC
  Administered 2018-05-30: 12:00:00 via INTRAVENOUS

## 2018-05-30 MED ORDER — SODIUM CHLORIDE 0.9 % IV SOLN: INTRAVENOUS | Status: DC

## 2018-05-30 MED ORDER — POLYETHYLENE GLYCOL 3350 17 GM/SCOOP PO POWD
1.0000 | Freq: Once | ORAL | Status: AC
  Administered 2018-05-30: 255 g via ORAL
  Filled 2018-05-30: qty 255

## 2018-05-30 MED ORDER — SODIUM CHLORIDE 0.9 % IV BOLUS
250.0000 mL | Freq: Once | INTRAVENOUS | Status: AC
  Administered 2018-05-30: 250 mL via INTRAVENOUS

## 2018-05-30 SURGICAL SUPPLY — 21 items

## 2018-05-30 NOTE — Progress Notes (Addendum)
**Note De-Identified vi Obfusction** PROGRESS NOTE    [name]  XHB:[id number] DOB: 15-Oct-1965 DOA: 05/28/2018 PCP: Lwernce Cruel, MD   Brief Nrrtive:  [name] is  53 y.o. mle with medicl history significnt of depression; substnce buse; Crohn's; utoimmune heptitis; nd chronic pin syndrome presenting with rectl bleeding. He hs hd  grdul decline.  He struggled through the end of the yer lst yer with deteriorting conditions, more blood nd more gs.  By Christms, things weren't going well.  He strted with blnd diet nd trying to clm down the flre.  "But I filed.  I took 3 months to do it nd I filed."  He eliminted vitmins nd most of his medicines nd it still didn't get better.  He hs been tpering down off prednisone since Jnury - he thought it ws mking his stomch hurt nd mking him bleed more, nd he didn't think it ws helping his digestion so he ws trying to "eliminte n irritnt."  When this hppened severl yers go, he ws referred to hospice nd went to inptient hospice nd "they hd to throw me out of hospice becuse I wouldn't die."  No fever.  He slowly tpered off prednisone off 2 weeks nd thinks he didn't hve withdrwl but he noticed feeling  little hot fter coming off of it (not febrile, just "hot").  He would hve cold chills before he would pss blood.  Lst night, he hd bout 7 episodes in  2 hour period.  He ws nuseted, hd light-hededness, hd difficulty nvigting bck to his bedroom.  He cknowledges ongoing depression.  He hs hd multiple suicide ttempts.  He reports tht he ws ddicted to drugs nd lcohol for 15 yers nd stopped for 2 yers before being dignosed with Crohn's.  Both wys were horrible, but this ctully mkes him feel worse.  He denies ctive SI/HI but does hve suicidl thoughts.  Assessment & Pln:   Principl Problem:   Crohn's colitis, with fistul ([place]) Active Problems:   Essentil hypertension   Mjor  depressive disorder, recurrent episode, severe ([name])   Autoimmune heptitis-PSC overlp   Chronic pin syndrome   Grover's disese   Abnorml CT scn, liver   Crohn's -Ptient with h/o Crohn's, not on chronic controlling medictions t this time nd hs wened himself off steroids -He hs filed outptient mngement nd presents for GI consulttion nd ssistnce -Presented with dirrhe nd weight loss over lst few months -Will give gentle IVF hydrtion nd ptient will be on cler liquids for now while formulting  pln with GI -Pin control with low-dose morphine nd Vicodin (hs h/o polysubstnce so will need to monitor this closely) -C diff toxin positive, but toxigenic C diff PCR negtive.  On vncomycin.  Fecl clprotectin pending. - Cler liquid diet nd mirlx/gtorde prep for possible colonoscopy tomorrow (he did no drink prep lst night) - entyvio ntibody pending  Hypoglycemi: Likely in setting of poor PO intke nd NPO sttus for preprtion for colonoscopy.  Will strt d5lr.   - Continue to monitor BGs q4 hrs - If persistent nd unexplined, continue further lb w/u  Poor PO intke: seen by dieticin nd relted to mood/limited food cceptnce.   -t risk for refeeding, follow mg, potssium, phosphorus dily -continue to encourge PO intke - D5 in fluids s noted bove  MDD  Anxiety -Ptient with h/o depression with suicidl idetion - Per dmitting provider, he continues to hve suicidl thoughts but denied pln or the energy to execute  **Note De-Identified vi Obfusction** pln -He hs wened himself off medictions (most recently Remeron) -Psych c/s for severe depression with suicidl thoughts.  GI lso requested c/s s he ppers to hve significnt nxiety ffecting his tretment of his crohn's.    Autoimmune heptitis-PSC -Norml LFTs  -Followed by GI -Previously scheduled for MRI, my need while here but will defer to GI  Chronic pin with h/o polysubstnce buse -Continue Norco  nd dd prn IV morphine -Monitor for overuse or other behviors concerning for drug-seeking  Grover's disese -Pt with hx of grover's disese -He hs  rsh on ll of his extremities, worse on RLE.  Could be grover's disese? But this typiclly is primrily the torso.   -will need dermtology follow up -strt on trimcinolone/eucerin  HTN - BP well controlled, on low side  DVT prophylxis: SCD Code Sttus: DNR Fmily Communiction: none t bedside Disposition Pln: pending further evlution by GI   Consultnts:   GI  Psych  Procedures:   none  Antimicrobils:  Anti-infectives (From dmission, onwrd)   Strt     Dose/Rte Route Frequency Ordered Stop   05/28/18 1845  vncomycin (VANCOCIN) 50 mg/mL orl solution 125 mg     125 mg Orl 4 times dily 05/28/18 1824 06/07/18 1759     Subjective: Still with dirrhe. Didn't drink his prep.  Objective: Vitls:   05/29/18 0520 05/29/18 1259 05/29/18 2226 05/30/18 0515  BP: (!) 109/58 122/60 (!) 92/59 (!) 99/58  Pulse: 92 86 76 73  Resp: 16 18 17 17   Temp: 100.2 F (37.9 C) 99.6 F (37.6 C) 97.6 F (36.4 C) (!) 97.1 F (36.2 C)  TempSrc: Orl Orl Orl Orl  SpO2: 96% 94% 100% 98%  Weight:      Height:        Intke/Output Summry (Lst 24 hours) t 05/30/2018 1026 Lst dt filed t 05/30/2018 0700 Gross per 24 hour  Intke 1200 ml  Output 1775 ml  Net -575 ml   Filed Weights   05/28/18 0923 05/28/18 1435  Weight: 77.4 kg 74.7 kg    Exmintion:  Generl: No cute distress. Crdiovsculr: Hert sounds show  regulr rte, nd rhythm.  Lungs: Cler to usculttion bilterlly  Abdomen: Soft, nontender, nondistended Neurologicl: Alert nd oriented 3. Moves ll extremities 4. Crnil nerves II through XII grossly intct. Skin: Wrm nd dry. No rshes or lesions. Extremities: Diffuse erythemtous ppulr rsh, most prominent on LLE, but present on ll 4 extremities nd torso.  No orl involvement.  Psychitric: Mood nd ffect re norml. Insight nd judgment re pproprite.    Dt Reviewed: I hve personlly reviewed following lbs nd imging studies  CBC: Recent Lbs  Lb 05/28/18 1008 05/29/18 0524 05/30/18 0427  WBC 6.9 6.9 4.0  NEUTROABS 3.9  --   --   HGB 12.3* 10.5* 10.9*  HCT 37.9* 33.5* 34.1*  MCV 85.4 87.5 86.5  PLT 256 218 433   Bsic Metbolic Pnel: Recent Lbs  Lb 05/28/18 1008 05/29/18 0524 05/30/18 0427  NA 133* 133* 134*  K 2.6* 3.2* 3.2*  CL 101 101 103  CO2 24 23 22   GLUCOSE 65* 55* 38*  BUN <5* <5* <5*  CREATININE 0.77 0.90 0.66  CALCIUM 7.5* 7.5* 7.8*  MG 1.7  --  1.9  PHOS 3.0  --   --    GFR: Estimted Cretinine Clernce: 110.2 mL/min (by C-G formul bsed on SCr of 0.66 mg/dL). Liver Function Tests: Recent Lbs  Lb 05/28/18 1008 05/30/18 0427  AST 22 24  LT 19 18  LKPHOS 121 105  BILITOT 0.7 0.7  PROT 4.7* 4.3*  LBUMIN 2.2* 2.0*   Recent Labs  Lab 05/28/18 1008  LIPSE 18   No results for input(s): MMONI in the last 168 hours. Coagulation Profile: Recent Labs  Lab 05/28/18 1008  INR 1.1   Cardiac Enzymes: No results for input(s): CKTOTL, CKMB, CKMBINDEX, TROPONINI in the last 168 hours. BNP (last 3 results) No results for input(s): PROBNP in the last 8760 hours. Hb1C: No results for input(s): HGB1C in the last 72 hours. CBG: Recent Labs  Lab 05/30/18 0613 05/30/18 0757 05/30/18 0914 05/30/18 0959  GLUCP 130* 63* 64* 78   Lipid Profile: No results for input(s): CHOL, HDL, LDLCLC, TRIG, CHOLHDL, LDLDIRECT in the last 72 hours. Thyroid Function Tests: Recent Labs    05/28/18 1008  TSH 4.496   nemia Panel: Recent Labs    05/28/18 1008 05/30/18 0427  VITMINB12 799  --   FERRITIN  --  293  TIBC  --  119*  IRON  --  14*   Sepsis Labs: Recent Labs  Lab 05/28/18 1010 05/28/18 1210  LTICCIDVEN 1.0 1.0    Recent Results (from the past 240 hour(s))  C difficile quick scan w  PCR reflex     Status: bnormal   Collection Time: 05/28/18  3:08 PM  Result Value Ref Range Status   C Diff antigen NEGTIVE NEGTIVE Final   C Diff toxin POSITIVE () NEGTIVE Final    Comment: CRITICL RESULT CLLED TO, RED BCK BY ND VERIFIED WITH: HUGHEY,C RN [phone] [id number] COVINGTON,N    C Diff interpretation Results are indeterminate. See PCR results.  Final    Comment: Performed at [facility], [place] [address], [place], [place] [address]  C. Diff by PCR, Reflexed     Status: None   Collection Time: 05/28/18  3:08 PM  Result Value Ref Range Status   Toxigenic C. Difficile by PCR NEGTIVE NEGTIVE Final    Comment: Patient is colonized with non toxigenic C. difficile. May not need treatment unless significant symptoms are present. Performed at [facility], [place] [address] [place], [place] [address]          Radiology Studies: No results found.      Scheduled Meds: . cetaphil   Topical BID  . dextrose      . feeding supplement  1 Container Oral Q24H  . multivitamin with minerals  1 tablet Oral Daily  . nystatin cream   Topical BID  . polyethylene glycol powder  1 Container Oral Once  . [STRT ON 05/31/2018] polyethylene glycol powder  1 Container Oral Once  . potassium chloride  40 mEq Oral Q4H  . protein supplement  8 oz Oral BID WC  . saccharomyces boulardii  250 mg Oral BID  . vancomycin  125 mg Oral QID   Continuous Infusions: . sodium chloride    . lactated ringers 75 mL/hr at 05/29/18 1535     LOS: 2 days    Time spent: over 30 min    Fayrene Helper, MD [facility] Pager MION  If 7PM-7M, please contact night-coverage [email or web address] Password [email or web address] 05/30/2018, 10:26 M

## 2018-05-30 NOTE — Progress Notes (Signed)
[facility] Progress Note    Since last GI note: He would not see psychiatry yesterday. He stopped taking his prep after drinking about 25%of it because he felt it was causing him to have blurry vision. Still with intermittent diarrhea, bleeding.  Objective: Vital signs in last 24 hours: Temp:  [97.1 F (36.2 C)-99.6 F (37.6 C)] 97.1 F (36.2 C) (03/31 0515) Pulse Rate:  [73-86] 73 (03/31 0515) Resp:  [17-18] 17 (03/31 0515) BP: (92-122)/(58-60) 99/58 (03/31 0515) SpO2:  [94 %-100 %] 98 % (03/31 0515) Last BM Date: 05/29/18 General: alert and oriented times 3 Heart: regular rate and rythm Abdomen: soft, non-tender, non-distended, normal bowel sounds   Lab Results: Recent Labs    05/28/18 1008 05/29/18 0524 05/30/18 0427  WBC 6.9 6.9 4.0  HGB 12.3* 10.5* 10.9*  PLT 256 218 201  MCV 85.4 87.5 86.5   Recent Labs    05/28/18 1008 05/29/18 0524 05/30/18 0427  NA 133* 133* 134*  K 2.6* 3.2* 3.2*  CL 101 101 103  CO2 24 23 22   GLUCOSE 65* 55* 38*  BUN <5* <5* <5*  CREATININE 0.77 0.90 0.66  CALCIUM 7.5* 7.5* 7.8*   Recent Labs    05/28/18 1008 05/30/18 0427  PROT 4.7* 4.3*  ALBUMIN 2.2* 2.0*  AST 22 24  ALT 19 18  ALKPHOS 121 105  BILITOT 0.7 0.7   Recent Labs    05/28/18 1008  INR 1.1    Medications: Scheduled Meds: . dextrose      . feeding supplement  1 Container Oral Q24H  . multivitamin with minerals  1 tablet Oral Daily  . nystatin cream   Topical BID  . potassium chloride  40 mEq Oral Q4H  . protein supplement  8 oz Oral BID WC  . saccharomyces boulardii  250 mg Oral BID  . vancomycin  125 mg Oral QID   Continuous Infusions: . sodium chloride    . lactated ringers 75 mL/hr at 05/29/18 1535   PRN Meds:.acetaminophen **OR** acetaminophen, clonazePAM, HYDROcodone-acetaminophen, morphine injection, ondansetron **OR** ondansetron (ZOFRAN) IV    Assessment/Plan: 53 y.o. male with longstanding Crohn's ileocolitis with  perianal disease.  Unfortunately he did not drink his prep last night due to 'blurry vision.' I explained to him that is is very unlikely the nulytly prep was causing his vision trouble.  He prefers miralax, gatorade prep.  I will order that for him tonight and in AM (split dose).  Clears for now.  Will reschedule colonoscopy for tomorrow.  He turned away psychiatry yesterday, hopefully he will see them today as his anxiety is significantly hindering his medical care.  I explained to him that it will be very hard for [place] to help him if he cannot follow our recommendations.     Milus Banister, MD  05/30/2018, 7:33 AM [facility] Pager [phone]

## 2018-05-30 NOTE — H&P (View-Only) (Signed)
[facility] Progress Note    Since last GI note: He would not see psychiatry yesterday. He stopped taking his prep after drinking about 25%of it because he felt it was causing him to have blurry vision. Still with intermittent diarrhea, bleeding.  Objective: Vital signs in last 24 hours: Temp:  [97.1 F (36.2 C)-99.6 F (37.6 C)] 97.1 F (36.2 C) (03/31 0515) Pulse Rate:  [73-86] 73 (03/31 0515) Resp:  [17-18] 17 (03/31 0515) BP: (92-122)/(58-60) 99/58 (03/31 0515) SpO2:  [94 %-100 %] 98 % (03/31 0515) Last BM Date: 05/29/18 General: alert and oriented times 3 Heart: regular rate and rythm Abdomen: soft, non-tender, non-distended, normal bowel sounds   Lab Results: Recent Labs    05/28/18 1008 05/29/18 0524 05/30/18 0427  WBC 6.9 6.9 4.0  HGB 12.3* 10.5* 10.9*  PLT 256 218 201  MCV 85.4 87.5 86.5   Recent Labs    05/28/18 1008 05/29/18 0524 05/30/18 0427  NA 133* 133* 134*  K 2.6* 3.2* 3.2*  CL 101 101 103  CO2 24 23 22   GLUCOSE 65* 55* 38*  BUN <5* <5* <5*  CREATININE 0.77 0.90 0.66  CALCIUM 7.5* 7.5* 7.8*   Recent Labs    05/28/18 1008 05/30/18 0427  PROT 4.7* 4.3*  ALBUMIN 2.2* 2.0*  AST 22 24  ALT 19 18  ALKPHOS 121 105  BILITOT 0.7 0.7   Recent Labs    05/28/18 1008  INR 1.1    Medications: Scheduled Meds: . dextrose      . feeding supplement  1 Container Oral Q24H  . multivitamin with minerals  1 tablet Oral Daily  . nystatin cream   Topical BID  . potassium chloride  40 mEq Oral Q4H  . protein supplement  8 oz Oral BID WC  . saccharomyces boulardii  250 mg Oral BID  . vancomycin  125 mg Oral QID   Continuous Infusions: . sodium chloride    . lactated ringers 75 mL/hr at 05/29/18 1535   PRN Meds:.acetaminophen **OR** acetaminophen, clonazePAM, HYDROcodone-acetaminophen, morphine injection, ondansetron **OR** ondansetron (ZOFRAN) IV    Assessment/Plan: 53 y.o. male with longstanding Crohn's ileocolitis with  perianal disease.  Unfortunately he did not drink his prep last night due to 'blurry vision.' I explained to him that is is very unlikely the nulytly prep was causing his vision trouble.  He prefers miralax, gatorade prep.  I will order that for him tonight and in AM (split dose).  Clears for now.  Will reschedule colonoscopy for tomorrow.  He turned away psychiatry yesterday, hopefully he will see them today as his anxiety is significantly hindering his medical care.  I explained to him that it will be very hard for [place] to help him if he cannot follow our recommendations.     Milus Banister, MD  05/30/2018, 7:33 AM [facility] Pager [phone]

## 2018-05-31 ENCOUNTER — Inpatient Hospital Stay (HOSPITAL_COMMUNITY): Payer: Medicare Other | Admitting: Anesthesiology

## 2018-05-31 ENCOUNTER — Encounter (HOSPITAL_COMMUNITY): Payer: Self-pay | Admitting: *Deleted

## 2018-05-31 ENCOUNTER — Encounter (HOSPITAL_COMMUNITY)
Admission: EM | Disposition: A | Discharge status: Home / Self Care | Payer: Self-pay | Source: Home / Self Care | Attending: Family Medicine

## 2018-05-31 ENCOUNTER — Inpatient Hospital Stay (HOSPITAL_COMMUNITY): Payer: Medicare Other

## 2018-05-31 DIAGNOSIS — F331 Major depressive disorder, recurrent, moderate: Secondary | ICD-10-CM

## 2018-05-31 DIAGNOSIS — M7989 Other specified soft tissue disorders: Secondary | ICD-10-CM

## 2018-05-31 DIAGNOSIS — K529 Noninfective gastroenteritis and colitis, unspecified: Secondary | ICD-10-CM

## 2018-05-31 HISTORY — PX: COLONOSCOPY WITH PROPOFOL: SHX5780

## 2018-05-31 HISTORY — PX: BIOPSY: SHX5522

## 2018-05-31 LAB — CBC
HEMATOCRIT: 37.4 % — AB (ref 39.0–52.0)
Hemoglobin: 11.8 g/dL — ABNORMAL LOW (ref 13.0–17.0)
MCH: 27.3 pg (ref 26.0–34.0)
MCHC: 31.6 g/dL (ref 30.0–36.0)
MCV: 86.4 fL (ref 80.0–100.0)
PLATELETS: 267 10*3/uL (ref 150–400)
RBC: 4.33 MIL/uL (ref 4.22–5.81)
RDW: 14 % (ref 11.5–15.5)
WBC: 5.4 10*3/uL (ref 4.0–10.5)
nRBC: 0 % (ref 0.0–0.2)

## 2018-05-31 LAB — COMPREHENSIVE METABOLIC PANEL
ALT: 20 U/L (ref 0–44)
AST: 26 U/L (ref 15–41)
Albumin: 2.1 g/dL — ABNORMAL LOW (ref 3.5–5.0)
Alkaline Phosphatase: 110 U/L (ref 38–126)
Anion gap: 8 (ref 5–15)
BUN: <5 mg/dL — ABNORMAL LOW (ref 6–20)
CHLORIDE: 101 mmol/L (ref 98–111)
CO2: 23 mmol/L (ref 22–32)
Calcium: 8 mg/dL — ABNORMAL LOW (ref 8.9–10.3)
Creatinine, Ser: 0.82 mg/dL (ref 0.61–1.24)
GFR calc Af Amer: >60 mL/min (ref >60)
Glucose, Bld: 102 mg/dL — ABNORMAL HIGH (ref 70–99)
POTASSIUM: 3.1 mmol/L — AB (ref 3.5–5.1)
Sodium: 132 mmol/L — ABNORMAL LOW (ref 135–145)
Total Bilirubin: 0.6 mg/dL (ref 0.3–1.2)
Total Protein: 4.7 g/dL — ABNORMAL LOW (ref 6.5–8.1)

## 2018-05-31 LAB — GLUCOSE, CAPILLARY
Glucose-Capillary: 76 mg/dL (ref 70–99)
Glucose-Capillary: 80 mg/dL (ref 70–99)
Glucose-Capillary: 82 mg/dL (ref 70–99)
Glucose-Capillary: 86 mg/dL (ref 70–99)
Glucose-Capillary: 94 mg/dL (ref 70–99)
Glucose-Capillary: 95 mg/dL (ref 70–99)
Glucose-Capillary: 97 mg/dL (ref 70–99)

## 2018-05-31 LAB — MAGNESIUM: MAGNESIUM: 1.9 mg/dL (ref 1.7–2.4)

## 2018-05-31 SURGERY — COLONOSCOPY WITH PROPOFOL
Anesthesia: Monitor Anesthesia Care

## 2018-05-31 MED ORDER — POTASSIUM CHLORIDE 20 MEQ/15ML (10%) PO SOLN: 40.0000 meq | Freq: Once | ORAL | Status: DC

## 2018-05-31 MED ORDER — ONDANSETRON HCL 4 MG/2ML IJ SOLN
INTRAMUSCULAR | Status: DC | PRN
  Administered 2018-05-31: 4 mg via INTRAVENOUS

## 2018-05-31 MED ORDER — POTASSIUM CHLORIDE 20 MEQ/15ML (10%) PO SOLN
40.0000 meq | Freq: Two times a day (BID) | ORAL | Status: DC
  Administered 2018-05-31: 40 meq via ORAL
  Filled 2018-05-31 (×2): qty 30

## 2018-05-31 MED ORDER — MAGNESIUM SULFATE 2 GM/50ML IV SOLN
2.0000 g | Freq: Once | INTRAVENOUS | Status: AC
  Administered 2018-05-31: 2 g via INTRAVENOUS
  Filled 2018-05-31: qty 50

## 2018-05-31 MED ORDER — POTASSIUM CHLORIDE IN NACL 40-0.9 MEQ/L-% IV SOLN
INTRAVENOUS | Status: DC
  Administered 2018-05-31: 12:00:00 75 mL/h via INTRAVENOUS
  Filled 2018-05-31 (×2): qty 1000

## 2018-05-31 MED ORDER — LOPERAMIDE HCL 2 MG PO CAPS
2.0000 mg | ORAL_CAPSULE | Freq: Two times a day (BID) | ORAL | Status: DC
  Filled 2018-05-31: qty 1

## 2018-05-31 MED ORDER — PROPOFOL 500 MG/50ML IV EMUL
INTRAVENOUS | Status: DC | PRN
  Administered 2018-05-31: 150 ug/kg/min via INTRAVENOUS

## 2018-05-31 MED ORDER — PROPOFOL 10 MG/ML IV BOLUS
INTRAVENOUS | Status: DC | PRN
  Administered 2018-05-31: 20 mg via INTRAVENOUS

## 2018-05-31 MED ORDER — EPHEDRINE SULFATE-NACL 50-0.9 MG/10ML-% IV SOSY
PREFILLED_SYRINGE | INTRAVENOUS | Status: DC | PRN
  Administered 2018-05-31: 10 mg via INTRAVENOUS

## 2018-05-31 MED ORDER — PROPOFOL 10 MG/ML IV BOLUS
INTRAVENOUS | Status: AC
  Filled 2018-05-31: qty 20

## 2018-05-31 MED ORDER — LACTATED RINGERS IV SOLN
INTRAVENOUS | Status: DC
  Administered 2018-05-31: 1000 mL via INTRAVENOUS

## 2018-05-31 MED ORDER — PROPOFOL 10 MG/ML IV BOLUS
INTRAVENOUS | Status: AC
  Filled 2018-05-31: qty 40

## 2018-05-31 MED ORDER — PREDNISONE 20 MG PO TABS
40.0000 mg | ORAL_TABLET | Freq: Every day | ORAL | Status: DC
  Filled 2018-05-31: qty 2

## 2018-05-31 MED ORDER — LIDOCAINE 2% (20 MG/ML) 5 ML SYRINGE
INTRAMUSCULAR | Status: DC | PRN
  Administered 2018-05-31: 60 mg via INTRAVENOUS

## 2018-05-31 SURGICAL SUPPLY — 22 items

## 2018-05-31 NOTE — Progress Notes (Signed)
Patient ID: [name], male   DOB: 1965/06/03, 53 y.o.   MRN: [id number]  Brief GI Update Note: K+ 3.1 this am. Dr. Allyson Sabal ordered KCL 4omeq po x 1 now. Patient for colonoscopy at 0930. Anesthesiologist contact by Sanjuana Mae RN, hold KCL po until after colonoscopy completed. Pt to proceed with colonoscopy as scheduled. Patient completed all of his Miralax prep. Passing water with some blood per the rectum this morning. Complains of mild central lower abdominal tenderness. Brief exam: RRR, regular rhythm, lungs clear, abdomen soft,  nondistended, mild central lower abdominal tenderness without rebound or guarding, + BS x 4 quads.

## 2018-05-31 NOTE — Progress Notes (Addendum)
PROGRESS NOTE    [name]  BEM:[id number] DOB: 1965-06-30 DOA: 05/28/2018 PCP: [name], MD   Brief Narrative:  [name] is a 53 y.o. male with medical history significant of depression; substance abuse; Crohn's; autoimmune hepatitis; and chronic pain syndrome presenting with rectal bleeding. He has had a gradual decline.  He struggled through the end of the year last year with deteriorating conditions, more blood and more gas.  By Christmas, things weren't going well.     He has been tapering down off prednisone since January - he thought it was making his stomach hurt and making him bleed more, and he didn't think it was helping his digestion  .  He slowly tapered off prednisone off 2 weeks and thinks he didn't have withdrawal but he noticed feeling a little hot after coming off of it (not febrile, just "hot").  He would have cold chills before he would pass blood.  Last night, he had about 7 episodes in a 2 hour period.  He was nauseated, had light-headedness, had difficulty navigating back to his bedroom.  He acknowledges ongoing depression.  He has had multiple suicide attempts.  He reports that he was addicted to drugs and alcohol for 15 years and stopped for 2 years before being diagnosed with Crohn's.  Both ways were horrible, but this actually makes him feel worse.  He denies active SI/HI but does have suicidal thoughts.  Assessment & Plan:   Principal Problem:   MDD (major depressive disorder), recurrent episode, moderate ([name]) Active Problems:   Essential hypertension   Major depressive disorder, recurrent episode, severe ([name])   Autoimmune hepatitis-PSC overlap   Chronic pain syndrome   Grover's disease   Crohn's colitis, with fistula ([name])   Abnormal CT scan, liver   Crohn's -Patient with h/o Crohn's, not on chronic controlling medications at this time and has weaned himself off steroids -He has failed outpatient management and presents for GI  consultation and assistance -Presented with diarrhea and weight loss over last few months -  IVF hydration and patient will be on clear liquids for now while formulating a plan with GI -Pain control with low-dose morphine and Vicodin (has h/o polysubstance so will need to monitor this closely) -C diff toxin positive, but toxigenic C diff PCR negative.  On vancomycin.  Fecal calprotectin pending. - Clear liquid diet and miralax/gatorade prep for possible colonoscopy tomorrow (he did no drink prep last night) - entyvio antibody pending Colonoscopy Impression: 4/1  Crohn's ileocolitis (mild to moderately active throughout the colon, visualized TI). - Persisently patent IC valve, possibly scarred open. - No signs of C. difficile infection. - Soft, rubbery perirectal nodule is NOT an perirectal abscess  gastroenterology recommends stop oral vancomycin, he does not have C. difficile infection. Will stop GI precautions as well. - patient refusing  steroids today (prednisone 63m once daily).Does not agree with Dr GCarlean Purlor Dr JArdis Hughs- Will discuss with Dr. GCarlean Purlabout changing him to remicade from entyvio (awaiting Antibodies for now). - Twice daily scheduled   Hypoglycemia: Likely in setting of poor PO intake and NPO status for preparation for colonoscopy.   Completed the 5, now switched to normal saline with potassium - Continue to monitor BGs q4 hrs    Poor PO intake: seen by dietician and related to mood/limited food acceptance.   -at risk for refeeding, follow mag, potassium, phosphorus daily -continue to encourage PO intake, started on a regular diet by GI  MDD  Anxiety -Patient with h/o depression with suicidal ideation - Per admitting provider, he continues to have suicidal thoughts but denied plan or the energy to execute a plan -He has weaned himself off medications (most recently Remeron) -Psych c/s for severe depression with suicidal thoughts.  GI also requested c/s as he  appears to have significant anxiety affecting his treatment of his crohn's.    Autoimmune hepatitis-PSC -Normal LFTs  -Followed by GI -Previously scheduled for MRI, may need while here but will defer to GI  Chronic pain with h/o polysubstance abuse -Continue Norco and add prn IV morphine -Monitor for overuse or other behaviors concerning for drug-seeking  Grover's disease -Pt with hx of grover's disease -He has a rash on all of his extremities, worse on RLE.  Could be grover's disease? But this typically is primarily the torso.   -will need dermatology follow up -start on triamcinolone/eucerin  HTN - BP well controlled, on low side  Left LE swelling Unilateral swelling, will do venous doppler  to r/o DVT    DVT prophylaxis: SCD Code Status: DNR Family Communication: none at bedside Disposition Plan:  Anticipate discharge tomorrow if the patient does well   Consultants:   GI  Psych  Procedures:   none  Antimicrobials:  Anti-infectives (From admission, onward)   Start     Dose/Rate Route Frequency Ordered Stop   05/28/18 1845  vancomycin (VANCOCIN) 50 mg/mL oral solution 125 mg     125 mg Oral 4 times daily 05/28/18 1824 06/07/18 1759     Subjective: No diarrhea since colonoscopy, denies any significant amount of abdominal pain  Objective: Vitals:   05/31/18 1041 05/31/18 1050 05/31/18 1100 05/31/18 1347  BP: 107/69 120/70 122/76 (!) 106/53  Pulse: 93 92 84 79  Resp: 20 12 17 12   Temp:    (!) 97.3 F (36.3 C)  TempSrc:    Oral  SpO2: 100% 96% 99% 100%  Weight:      Height:        Intake/Output Summary (Last 24 hours) at 05/31/2018 1418 Last data filed at 05/31/2018 1200 Gross per 24 hour  Intake 2216.55 ml  Output -  Net 2216.55 ml   Filed Weights   05/28/18 0923 05/28/18 1435 05/31/18 0902  Weight: 77.4 kg 74.7 kg 74.7 kg    Examination:  General: No acute distress. Cardiovascular: Heart sounds show a regular rate, and rhythm.  Lungs:  Clear to auscultation bilaterally  Abdomen: Soft, nontender, nondistended Neurological: Alert and oriented 3. Moves all extremities 4. Cranial nerves II through XII grossly intact. Skin: Warm and dry. No rashes or lesions. Extremities: Diffuse erythematous papular rash, most prominent on LLE, but present on all 4 extremities and torso.  No oral involvement. Psychiatric: Mood and affect are normal. Insight and judgment are appropriate.    Data Reviewed: I have personally reviewed following labs and imaging studies  CBC: Recent Labs  Lab 05/28/18 1008 05/29/18 0524 05/30/18 0427 05/31/18 0458  WBC 6.9 6.9 4.0 5.4  NEUTROABS 3.9  --   --   --   HGB 12.3* 10.5* 10.9* 11.8*  HCT 37.9* 33.5* 34.1* 37.4*  MCV 85.4 87.5 86.5 86.4  PLT 256 218 201 315   Basic Metabolic Panel: Recent Labs  Lab 05/28/18 1008 05/29/18 0524 05/30/18 0427 05/31/18 0458  NA 133* 133* 134* 132*  K 2.6* 3.2* 3.2* 3.1*  CL 101 101 103 101  CO2 24 23 22 23   GLUCOSE 65* 55* 38* 102*  BUN <5* <5* <5* <5*  CREATININE 0.77 0.90 0.66 0.82  CALCIUM 7.5* 7.5* 7.8* 8.0*  MG 1.7  --  1.9 1.9  PHOS 3.0  --  3.6  --    GFR: Estimated Creatinine Clearance: 107.5 mL/min (by C-G formula based on SCr of 0.82 mg/dL). Liver Function Tests: Recent Labs  Lab 05/28/18 1008 05/30/18 0427 05/31/18 0458  AST 22 24 26   ALT 19 18 20   ALKPHOS 121 105 110  BILITOT 0.7 0.7 0.6  PROT 4.7* 4.3* 4.7*  ALBUMIN 2.2* 2.0* 2.1*   Recent Labs  Lab 05/28/18 1008  LIPASE 18   No results for input(s): AMMONIA in the last 168 hours. Coagulation Profile: Recent Labs  Lab 05/28/18 1008  INR 1.1   Cardiac Enzymes: No results for input(s): CKTOTAL, CKMB, CKMBINDEX, TROPONINI in the last 168 hours. BNP (last 3 results) No results for input(s): PROBNP in the last 8760 hours. HbA1C: No results for input(s): HGBA1C in the last 72 hours. CBG: Recent Labs  Lab 05/30/18 2012 05/31/18 0017 05/31/18 0510 05/31/18 0752  05/31/18 1206  GLUCAP 112* 82 97 94 76   Lipid Profile: No results for input(s): CHOL, HDL, LDLCALC, TRIG, CHOLHDL, LDLDIRECT in the last 72 hours. Thyroid Function Tests: No results for input(s): TSH, T4TOTAL, FREET4, T3FREE, THYROIDAB in the last 72 hours. Anemia Panel: Recent Labs    05/30/18 0427  FERRITIN 293  TIBC 119*  IRON 14*   Sepsis Labs: Recent Labs  Lab 05/28/18 1010 05/28/18 1210  LATICACIDVEN 1.0 1.0    Recent Results (from the past 240 hour(s))  Calprotectin, Fecal     Status: Abnormal   Collection Time: 05/28/18  2:18 PM  Result Value Ref Range Status   Calprotectin, Fecal 3,049 (H) 0 - 120 ug/g Final    Comment: (NOTE) Concentration     Interpretation   Follow-Up <16 - 50 ug/g     Normal           None >50 -120 ug/g     Borderline       Re-evaluate in 4-6 weeks    >120 ug/g     Abnormal         Repeat as clinically                                   indicated Performed At: [facility] [place], [place] [id number] Rush Farmer MD EU:[id number]   C difficile quick scan w PCR reflex     Status: Abnormal   Collection Time: 05/28/18  3:08 PM  Result Value Ref Range Status   C Diff antigen NEGATIVE NEGATIVE Final   C Diff toxin POSITIVE (A) NEGATIVE Final    Comment: CRITICAL RESULT CALLED TO, READ BACK BY AND VERIFIED WITH: HUGHEY,C RN [id number] COVINGTON,N    C Diff interpretation Results are indeterminate. See PCR results.  Final    Comment: Performed at [facility], [place] [address] [place], [place] [address]  C. Diff by PCR, Reflexed     Status: None   Collection Time: 05/28/18  3:08 PM  Result Value Ref Range Status   Toxigenic C. Difficile by PCR NEGATIVE NEGATIVE Final    Comment: Patient is colonized with non toxigenic C. difficile. May not need treatment unless significant symptoms are present. Performed at [facility], [place] [address], [place], [place] [address]  Radiology  Studies: No results found.      Scheduled Meds: . cetaphil   Topical BID  . feeding supplement  1 Container Oral Q24H  . loperamide  2 mg Oral BID  . mirtazapine  30 mg Oral QHS  . multivitamin with minerals  1 tablet Oral Daily  . nystatin cream   Topical BID  . potassium chloride  40 mEq Oral BID  . predniSONE  40 mg Oral Q breakfast  . protein supplement  8 oz Oral BID WC  . saccharomyces boulardii  250 mg Oral BID  . triamcinolone cream   Topical BID  . vancomycin  125 mg Oral QID   Continuous Infusions: . 0.9 % NaCl with KCl 40 mEq / L Stopped (05/31/18 1138)  . dextrose 5% lactated ringers Stopped (05/31/18 0837)  . lactated ringers 1,000 mL (05/31/18 0929)     LOS: 3 days    Time spent: over 30 min    Reyne Dumas, MD [facility] Pager AMION  If 7PM-7AM, please contact night-coverage [email or web address] Password [email or web address] 05/31/2018, 2:18 PM

## 2018-05-31 NOTE — Progress Notes (Signed)
Left lower extremity venous duplex has been completed. Preliminary results can be found in CV Proc through chart review.   05/31/18 3:22 PM Carlos Levering RVT

## 2018-05-31 NOTE — Interval H&P Note (Signed)
History and Physical Interval Note:  05/31/2018 9:51 AM  [name]  has presented today for surgery, with the diagnosis of Crohn's diseases.  The various methods of treatment have been discussed with the patient and family. After consideration of risks, benefits and other options for treatment, the patient has consented to  Procedure(s): COLONOSCOPY WITH PROPOFOL (N/A) as a surgical intervention.  The patient's history has been reviewed, patient examined, no change in status, stable for surgery.  I have reviewed the patient's chart and labs.  Questions were answered to the patient's satisfaction.     Milus Banister

## 2018-05-31 NOTE — Transfer of Care (Signed)
Immediate Anesthesia Transfer of Care Note  Patient: [name]  Procedure(s) Performed: COLONOSCOPY WITH PROPOFOL (N/A ) BIOPSY  Patient Location: PACU  Anesthesia Type:MAC  Level of Consciousness: awake, alert  and oriented  Airway & Oxygen Therapy: Patient Spontanous Breathing and Patient connected to face mask oxygen  Post-op Assessment: Report given to RN and Post -op Vital signs reviewed and stable  Post vital signs: Reviewed and stable  Last Vitals:  Vitals Value Taken Time  BP 81/57 05/31/2018 10:27 AM  Temp    Pulse 79 05/31/2018 10:29 AM  Resp 14 05/31/2018 10:29 AM  SpO2 100 % 05/31/2018 10:29 AM  Vitals shown include unvalidated device data.  Last Pain:  Vitals:   05/31/18 1027  TempSrc:   PainSc: 0-No pain      Patients Stated Pain Goal: 0 (16/10/96 0454)  Complications: No apparent anesthesia complications

## 2018-05-31 NOTE — Care Management Important Message (Signed)
Important Message  Patient Details  Name: QUADIR MUNS MRN: [id number] Date of Birth: 02-Jan-1966   Medicare Important Message Given:  Yes    Kerin Salen 05/31/2018, 11:40 AMImportant Message  Patient Details  Name: OTHMAN MASUR MRN: [id number] Date of Birth: 30-Nov-1965   Medicare Important Message Given:  Yes    Kerin Salen 05/31/2018, 11:40 AM

## 2018-05-31 NOTE — Anesthesia Procedure Notes (Signed)
Procedure Name: MAC Date/Time: 05/31/2018 9:52 AM Performed by: Maxwell Caul, CRNA Pre-anesthesia Checklist: Patient identified, Emergency Drugs available and Suction available Oxygen Delivery Method: Simple face mask

## 2018-05-31 NOTE — Anesthesia Preprocedure Evaluation (Signed)
Anesthesia Evaluation  Patient identified by MRN, date of birth, ID band Patient awake    Reviewed: Allergy & Precautions, NPO status , Patient's Chart, lab work & pertinent test results  History of Anesthesia Complications (+) Family history of anesthesia reaction  Airway Mallampati: I  TM Distance: >3 FB Neck ROM: Full    Dental no notable dental hx.    Pulmonary former smoker,    Pulmonary exam normal breath sounds clear to auscultation       Cardiovascular hypertension, Pt. on medications Normal cardiovascular exam Rhythm:Regular Rate:Normal     Neuro/Psych PSYCHIATRIC DISORDERS Anxiety Depression negative neurological ROS     GI/Hepatic negative GI ROS, (+)     substance abuse  alcohol use, H/O Crohns Disease on Prednisone 78m per day   Endo/Other  negative endocrine ROS  Renal/GU   negative genitourinary   Musculoskeletal  (+) Arthritis , Osteoarthritis,    Abdominal   Peds negative pediatric ROS (+)  Hematology  (+) Blood dyscrasia, anemia ,   Anesthesia Other Findings   Reproductive/Obstetrics negative OB ROS                             Anesthesia Physical  Anesthesia Plan  ASA: III  Anesthesia Plan: MAC   Post-op Pain Management:    Induction: Intravenous  PONV Risk Score and Plan: 1 and Treatment may vary due to age or medical condition and Ondansetron  Airway Management Planned: Simple Face Mask  Additional Equipment:   Intra-op Plan:   Post-operative Plan:   Informed Consent: I have reviewed the patients History and Physical, chart, labs and discussed the procedure including the risks, benefits and alternatives for the proposed anesthesia with the patient or authorized representative who has indicated his/her understanding and acceptance.       Plan Discussed with: CRNA, Surgeon and Anesthesiologist  Anesthesia Plan Comments:          Anesthesia Quick Evaluation

## 2018-05-31 NOTE — Anesthesia Postprocedure Evaluation (Signed)
Anesthesia Post Note  Patient: [name]  Procedure(s) Performed: COLONOSCOPY WITH PROPOFOL (N/A ) BIOPSY     Patient location during evaluation: Endoscopy Anesthesia Type: MAC Level of consciousness: awake and alert Pain management: pain level controlled Vital Signs Assessment: post-procedure vital signs reviewed and stable Respiratory status: spontaneous breathing, nonlabored ventilation and respiratory function stable Cardiovascular status: stable and blood pressure returned to baseline Postop Assessment: no apparent nausea or vomiting Anesthetic complications: no    Last Vitals:  Vitals:   05/31/18 1025 05/31/18 1027  BP: (!) 74/49 (!) 81/57  Pulse: 84 85  Resp: 10 (!) 21  Temp:    SpO2: 97% 100%    Last Pain:  Vitals:   05/31/18 1027  TempSrc:   PainSc: 0-No pain                 Lynda Rainwater

## 2018-05-31 NOTE — Op Note (Signed)
[facility] Patient Name: [name] Procedure Date: 05/31/2018 MRN: [id number] Attending MD: Milus Banister , MD Date of Birth: 02/08/1966 CSN: [id number] Age: 53 Admit Type: Inpatient Procedure:                Colonoscopy Indications:              Longstanding Crohn's disease, now with worsening                            diarrhea, bleeding Providers:                Milus Banister, MD, Cleda Daub, RN, Laverda Sorenson, Technician, Virgia Land, CRNA Referring MD:              Medicines:                 Complications:            No immediate complications. Estimated blood loss:                            None. Estimated Blood Loss:     Estimated blood loss: none. Procedure:                Pre-Anesthesia Assessment:                           - Prior to the procedure, a History and Physical                            was performed, and patient medications and                            allergies were reviewed. The patient's tolerance of                            previous anesthesia was also reviewed. The risks                            and benefits of the procedure and the sedation                            options and risks were discussed with the patient.                            All questions were answered, and informed consent                            was obtained. Prior Anticoagulants: The patient has                            taken no previous anticoagulant or antiplatelet                            agents. ASA Grade Assessment: II -  A patient with                            mild systemic disease. After reviewing the risks                            and benefits, the patient was deemed in                            satisfactory condition to undergo the procedure.                           After obtaining informed consent, the colonoscope                            was passed under direct vision. Throughout the                     procedure, the patient's blood pressure, pulse, and                            oxygen saturations were monitored continuously. The                            CF-HQ190L ([id number]) Olympus colonoscope was                            introduced through the anus and advanced to the the                            terminal ileum. The colonoscopy was performed                            without difficulty. The patient tolerated the                            procedure well. The quality of the bowel                            preparation was good. The terminal ileum, ileocecal                            valve, appendiceal orifice, and rectum were                            photographed. Scope In: 10:03:51 AM Scope Out: 10:18:48 AM Scope Withdrawal Time: 0 hours 9 minutes 43 seconds  Total Procedure Duration: 0 hours 14 minutes 57 seconds  Findings:      The IC valve was persistently wide open, perhaps scarred that way. There       was mild ileitis in the terminal ileum, biopsies taken      The mucosa throughout the colon (from anus to cecum) was mild to       moderatly inflamed without any signs of C. diffile (no pseudomembranes).       The colon mucosa was sampled (right and  left in separate specimine jars).      There were innumerable pedunculated polyps throughout the colon ranging       in size from 2-64m up to 1-2cm. All appeared consistent with previously       proven inflammatory pseudopolyps, none were sampled.      The previous SPOT submucosal injection site was easily identified in the       transverse colon, adjacent to an obvious pseudopolyp.      The exam was otherwise without abnormality on direct and retroflexion       views.      Soft, rubbery perirectal nodule measuring 2cm across, laying 2cm away       from the anus along left gluteus Impression:               - Crohn's ileocolitis (mild to moderately active                            throughout the colon,  visualized TI).                           - Persisently patent IC valve, possibly scarred                            open.                           - No signs of C. difficile infection.                           - Soft, rubbery perirectal nodule is NOT an                            perirectal abscess. It may be site of a chronic                            fistulua however I could not express any drainage. Moderate Sedation:      Not Applicable - Patient had care per Anesthesia. Recommendation:           - Return patient to hospital ward for ongoing care.                           - Will stop oral vancomycin, he does not have C.                            difficile infection. Will stop GI precautions as                            well.                           - Will start oral steroids today (prednisone 447m                           once daily).                           -  Will discuss with [name] about changing him                            to remicade from entyvio (awaiting Antibodies for                            now).                           - Twice daily scheduled imodium for now.                           - Encourage PO intake. Procedure Code(s):        --- Professional ---                           [phone], Colonoscopy, flexible; with biopsy, single                            or multiple Diagnosis Code(s):        --- Professional ---                           K52.9, Noninfective gastroenteritis and colitis,                            unspecified CPT copyright 2019 American Medical Association. All rights reserved. The codes documented in this report are preliminary and upon coder review may  be revised to meet current compliance requirements. Milus Banister, MD 05/31/2018 10:42:19 AM This report has been signed electronically. Number of Addenda: 0

## 2018-06-01 ENCOUNTER — Telehealth: Payer: Self-pay | Admitting: Internal Medicine

## 2018-06-01 LAB — GLUCOSE, CAPILLARY
Glucose-Capillary: 54 mg/dL — ABNORMAL LOW (ref 70–99)
Glucose-Capillary: 62 mg/dL — ABNORMAL LOW (ref 70–99)
Glucose-Capillary: 80 mg/dL (ref 70–99)
Glucose-Capillary: 81 mg/dL (ref 70–99)

## 2018-06-01 LAB — BASIC METABOLIC PANEL
Anion gap: 6 (ref 5–15)
BUN: <5 mg/dL — ABNORMAL LOW (ref 6–20)
CO2: 23 mmol/L (ref 22–32)
Calcium: 7.5 mg/dL — ABNORMAL LOW (ref 8.9–10.3)
Chloride: 105 mmol/L (ref 98–111)
Creatinine, Ser: 0.88 mg/dL (ref 0.61–1.24)
GFR calc Af Amer: >60 mL/min (ref >60)
GFR calc non Af Amer: >60 mL/min (ref >60)
Glucose, Bld: 94 mg/dL (ref 70–99)
Potassium: 3.6 mmol/L (ref 3.5–5.1)
Sodium: 134 mmol/L — ABNORMAL LOW (ref 135–145)

## 2018-06-01 MED ORDER — MESALAMINE 1.2 G PO TBEC: 4.8000 g | DELAYED_RELEASE_TABLET | Freq: Every day | ORAL | 1 refills | Status: DC

## 2018-06-01 MED ORDER — POTASSIUM CHLORIDE 20 MEQ/15ML (10%) PO SOLN: 40.0000 meq | Freq: Every day | ORAL | 0 refills | Status: DC

## 2018-06-01 MED ORDER — LOPERAMIDE HCL 2 MG PO CAPS: 2.0000 mg | ORAL_CAPSULE | Freq: Two times a day (BID) | ORAL | 0 refills | Status: DC

## 2018-06-01 MED ORDER — ADULT MULTIVITAMIN W/MINERALS CH: 1.0000 | ORAL_TABLET | Freq: Every day | ORAL | 1 refills | Status: DC

## 2018-06-01 MED ORDER — SACCHAROMYCES BOULARDII 250 MG PO CAPS: 250.0000 mg | ORAL_CAPSULE | Freq: Two times a day (BID) | ORAL | 0 refills | Status: DC

## 2018-06-01 MED ORDER — ONDANSETRON HCL 4 MG PO TABS: 4.0000 mg | ORAL_TABLET | Freq: Four times a day (QID) | ORAL | 0 refills | Status: DC | PRN

## 2018-06-01 NOTE — Discharge Summary (Signed)
Physician Discharge Summary  [name] MRN: [id number] DOB/AGE: 1965/04/14 53 y.o.  PCP: [name], MD   Admit date: 05/28/2018 Discharge date: 06/01/2018  Discharge Diagnoses:    Principal Problem:   MDD (major depressive disorder), recurrent episode, moderate ([place]) Active Problems:   Essential hypertension   Major depressive disorder, recurrent episode, severe ([place])   Autoimmune hepatitis-PSC overlap   Chronic pain syndrome   Grover's disease   Crohn's colitis, with fistula ([place])   Abnormal CT scan, liver    Follow-up recommendations  patient anxious to leave, refused to eat hospital food and just wanted to be discharged Given his workup has been completed he will be discharged as per his wishes Recommended to follow-up with gastroenterology [name]  prior to discharge he Reiterated that he will not be taking Lialada and prednisone   entyvio antibody pending at the time of discharge,      Allergies as of 06/01/2018      Reactions   Asacol [mesalamine] Diarrhea   abd pain   Flagyl [metronidazole] Diarrhea   abd pain   Azathioprine Diarrhea   abd pain   Gluten Meal Diarrhea   Mycophenolate Mofetil Diarrhea   abd pain   Other    NUTS; constipation   Wheat Bran Diarrhea   Constipation; flatulence; abd pain   Tape Itching, Rash   Please use "paper" tape      Medication List    TAKE these medications   AMBULATORY NON FORMULARY MEDICATION Dilitazem 2% cream  Apply pea size amount to rectum twice daily   ascorbic acid 1000 MG tablet Commonly known as:  VITAMIN C Take 2,000 mg by mouth daily.   Calcium 1000 + D 1000-800 MG-UNIT Tabs Generic drug:  Calcium Carb-Cholecalciferol Take by mouth. Takes 2 (2066m) tabs bid   cholecalciferol 1000 units tablet Commonly known as:  VITAMIN D Take 4,000 Units by mouth 2 (two) times daily.   clonazePAM 0.5 MG tablet Commonly known as:  KLONOPIN Take 1 tablet (0.5 mg total) by mouth at bedtime  as needed (insomnia).   ENTYVIO IV Inject into the vein. Every 2 months   HYDROcodone-acetaminophen 5-325 MG tablet Commonly known as:  NORCO/VICODIN Take 1-2 tablets by mouth every 6 (six) hours as needed for moderate pain. Written rx printed in advance May fill on or after 02/24/2018   loperamide 2 MG capsule Commonly known as:  IMODIUM Take 1 capsule (2 mg total) by mouth 2 (two) times daily.   mesalamine 1.2 g EC tablet Commonly known as:  Lialda Take 4 tablets (4.8 g total) by mouth daily with breakfast.   multivitamin with minerals Tabs tablet Take 1 tablet by mouth daily.   ondansetron 4 MG tablet Commonly known as:  ZOFRAN Take 1 tablet (4 mg total) by mouth every 6 (six) hours as needed for nausea.   potassium chloride 20 MEQ/15ML (10%) Soln Take 30 mLs (40 mEq total) by mouth daily.   Remeron 30 MG tablet Generic drug:  mirtazapine Take 30 mg by mouth at bedtime.   saccharomyces boulardii 250 MG capsule Commonly known as:  FLORASTOR Take 1 capsule (250 mg total) by mouth 2 (two) times daily.   triamcinolone cream 0.1 % Commonly known as:  KENALOG Apply 1-2 application topically daily as needed for other.        Discharge Condition:  Prognosis guarded due to noncompliance  Discharge Instructions Get Medicines reviewed and adjusted: Please take all your medications with you for your  next visit with your Primary MD  Please request your Primary MD to go over all hospital tests and procedure/radiological results at the follow up, please ask your Primary MD to get all Hospital records sent to his/her office.  If you experience worsening of your admission symptoms, develop shortness of breath, life threatening emergency, suicidal or homicidal thoughts you must seek medical attention immediately by calling 911 or calling your MD immediately  if symptoms less severe.  You must read complete instructions/literature along with all the possible adverse  reactions/side effects for all the Medicines you take and that have been prescribed to you. Take any new Medicines after you have completely understood and accpet all the possible adverse reactions/side effects.   Do not drive when taking Pain medications.   Do not take more than prescribed Pain, Sleep and Anxiety Medications  Special Instructions: If you have smoked or chewed Tobacco  in the last 2 yrs please stop smoking, stop any regular Alcohol  and or any Recreational drug use.  Wear Seat belts while driving.  Please note  You were cared for by a hospitalist during your hospital stay. Once you are discharged, your primary care physician will handle any further medical issues. Please note that NO REFILLS for any discharge medications will be authorized once you are discharged, as it is imperative that you return to your primary care physician (or establish a relationship with a primary care physician if you do not have one) for your aftercare needs so that they can reassess your need for medications and monitor your lab values.     Allergies  Allergen Reactions  . Asacol [Mesalamine] Diarrhea    abd pain  . Flagyl [Metronidazole] Diarrhea    abd pain  . Azathioprine Diarrhea    abd pain  . Gluten Meal Diarrhea  . Mycophenolate Mofetil Diarrhea    abd pain  . Other     NUTS; constipation  . Wheat Bran Diarrhea    Constipation; flatulence; abd pain  . Tape Itching and Rash    Please use "paper" tape      Disposition: Discharge disposition: 01-Home or Self Care        Consults:  gastroenterology    Significant Diagnostic Studies:  Vas [place] Lower Extremity Venous (dvt)     Summary: Right: No evidence of common femoral vein obstruction. Left: There is no evidence of deep vein thrombosis in the lower extremity. No cystic structure found in the popliteal fossa.  Danley Danker Weights   05/28/18 7322 05/28/18 1435 05/31/18 0902  Weight: 77.4 kg 74.7 kg 74.7 kg      Microbiology: Recent Results (from the past 240 hour(s))  Calprotectin, Fecal     Status: Abnormal   Collection Time: 05/28/18  2:18 PM  Result Value Ref Range Status   Calprotectin, Fecal 3,049 (H) 0 - 120 ug/g Final    Comment: (NOTE) Concentration     Interpretation   Follow-Up <16 - 50 ug/g     Normal           None >50 -120 ug/g     Borderline       Re-evaluate in 4-6 weeks    >120 ug/g     Abnormal         Repeat as clinically  indicated Performed At: [facility] [place], [place] [id number] Rush Farmer MD [id number]   C difficile quick scan w PCR reflex     Status: Abnormal   Collection Time: 05/28/18  3:08 PM  Result Value Ref Range Status   C Diff antigen NEGATIVE NEGATIVE Final   C Diff toxin POSITIVE (A) NEGATIVE Final    Comment: CRITICAL RESULT CALLED TO, READ BACK BY AND VERIFIED WITH: HUGHEY,C RN [id number] COV[place],N    C Diff interpretation Results are indeterminate. See PCR results.  Final    Comment: Performed at [facility], [place] [address], [place], [place] [address]  C. Diff by PCR, Reflexed     Status: None   Collection Time: 05/28/18  3:08 PM  Result Value Ref Range Status   Toxigenic C. Difficile by PCR NEGATIVE NEGATIVE Final    Comment: Patient is colonized with non toxigenic C. difficile. May not need treatment unless significant symptoms are present. Performed at [facility], [place] [address], [place], [place] [address]        Blood Culture No results found for: SDES, SPECREQUEST, CULT, REPTSTATUS    Labs: Results for orders placed or performed during the hospital encounter of 05/28/18 (from the past 48 hour(s))  Glucose, capillary     Status: None   Collection Time: 05/30/18  5:26 PM  Result Value Ref Range   Glucose-Capillary 70 70 - 99 mg/dL   Comment 1 Notify RN    Comment 2 Document in Chart   Glucose, capillary     Status: Abnormal    Collection Time: 05/30/18  8:12 PM  Result Value Ref Range   Glucose-Capillary 112 (H) 70 - 99 mg/dL   Comment 1 Notify RN    Comment 2 Document in Chart   Glucose, capillary     Status: None   Collection Time: 05/31/18 12:17 AM  Result Value Ref Range   Glucose-Capillary 82 70 - 99 mg/dL   Comment 1 Notify RN    Comment 2 Document in Chart   CBC     Status: Abnormal   Collection Time: 05/31/18  4:58 AM  Result Value Ref Range   WBC 5.4 4.0 - 10.5 K/uL   RBC 4.33 4.22 - 5.81 MIL/uL   Hemoglobin 11.8 (L) 13.0 - 17.0 g/dL   HCT 37.4 (L) 39.0 - 52.0 %   MCV 86.4 80.0 - 100.0 fL   MCH 27.3 26.0 - 34.0 pg   MCHC 31.6 30.0 - 36.0 g/dL   RDW 14.0 11.5 - 15.5 %   Platelets 267 150 - 400 K/uL   nRBC 0.0 0.0 - 0.2 %    Comment: Performed at [facility], [place] [address], [place], [place] [address]  Comprehensive metabolic panel     Status: Abnormal   Collection Time: 05/31/18  4:58 AM  Result Value Ref Range   Sodium 132 (L) 135 - 145 mmol/L   Potassium 3.1 (L) 3.5 - 5.1 mmol/L   Chloride 101 98 - 111 mmol/L   CO2 23 22 - 32 mmol/L   Glucose, Bld 102 (H) 70 - 99 mg/dL   BUN <5 (L) 6 - 20 mg/dL    Comment: REPEATED TO VERIFY   Creatinine, Ser 0.82 0.61 - 1.24 mg/dL   Calcium 8.0 (L) 8.9 - 10.3 mg/dL   Total Protein 4.7 (L) 6.5 - 8.1 g/dL   Albumin 2.1 (L) 3.5 - 5.0 g/dL   AST 26  15 - 41 U/L   ALT 20 0 - 44 U/L   Alkaline Phosphatase 110 38 - 126 U/L   Total Bilirubin 0.6 0.3 - 1.2 mg/dL   GFR calc non Af Amer >60 >60 mL/min   GFR calc Af Amer >60 >60 mL/min   Anion gap 8 5 - 15    Comment: Performed at [facility], [place] [address], [place], [place] [address]  Magnesium     Status: None   Collection Time: 05/31/18  4:58 AM  Result Value Ref Range   Magnesium 1.9 1.7 - 2.4 mg/dL    Comment: Performed at [facility], [place] [address] [place], [place] [address]  Glucose, capillary     Status: None   Collection Time:  05/31/18  5:10 AM  Result Value Ref Range   Glucose-Capillary 97 70 - 99 mg/dL  Glucose, capillary     Status: None   Collection Time: 05/31/18  7:52 AM  Result Value Ref Range   Glucose-Capillary 94 70 - 99 mg/dL   Comment 1 Notify RN    Comment 2 Document in Chart   Glucose, capillary     Status: None   Collection Time: 05/31/18 12:06 PM  Result Value Ref Range   Glucose-Capillary 76 70 - 99 mg/dL   Comment 1 Notify RN    Comment 2 Document in Chart   Glucose, capillary     Status: None   Collection Time: 05/31/18  4:06 PM  Result Value Ref Range   Glucose-Capillary 95 70 - 99 mg/dL   Comment 1 Notify RN    Comment 2 Document in Chart   Glucose, capillary     Status: None   Collection Time: 05/31/18  9:01 PM  Result Value Ref Range   Glucose-Capillary 86 70 - 99 mg/dL  Glucose, capillary     Status: None   Collection Time: 05/31/18 11:58 PM  Result Value Ref Range   Glucose-Capillary 80 70 - 99 mg/dL  Glucose, capillary     Status: Abnormal   Collection Time: 06/01/18  3:50 AM  Result Value Ref Range   Glucose-Capillary 54 (L) 70 - 99 mg/dL   Comment 1 Notify RN   Glucose, capillary     Status: Abnormal   Collection Time: 06/01/18  4:08 AM  Result Value Ref Range   Glucose-Capillary 62 (L) 70 - 99 mg/dL   Comment 1 Notify RN   Glucose, capillary     Status: None   Collection Time: 06/01/18  4:25 AM  Result Value Ref Range   Glucose-Capillary 80 70 - 99 mg/dL  Basic metabolic panel     Status: Abnormal   Collection Time: 06/01/18  4:46 AM  Result Value Ref Range   Sodium 134 (L) 135 - 145 mmol/L   Potassium 3.6 3.5 - 5.1 mmol/L   Chloride 105 98 - 111 mmol/L   CO2 23 22 - 32 mmol/L   Glucose, Bld 94 70 - 99 mg/dL   BUN <5 (L) 6 - 20 mg/dL   Creatinine, Ser 0.88 0.61 - 1.24 mg/dL   Calcium 7.5 (L) 8.9 - 10.3 mg/dL   GFR calc non Af Amer >60 >60 mL/min   GFR calc Af Amer >60 >60 mL/min   Anion gap 6 5 - 15    Comment: Performed at [facility], [place] [address], [place], [place] [address]  Glucose, capillary     Status: None   Collection Time:  06/01/18  6:37 AM  Result Value Ref Range   Glucose-Capillary 81 70 - 99 mg/dL     Lipid Panel     Component Value Date/Time   CHOL 288 (H) 02/05/2011 0937   TRIG 76.0 02/05/2011 0937   HDL 62.50 02/05/2011 0937   CHOLHDL 5 02/05/2011 0937   VLDL 15.2 02/05/2011 0937   LDLDIRECT 200.9 02/05/2011 0937     Lab Results  Component Value Date   HGBA1C 6.1 09/18/2014     Lab Results  Component Value Date   CREATININE 0.88 06/01/2018     HPI :   Dezman Granda Stricklenis a 19 y.o.malewith medical history significant ofdepression; substance abuse; Crohn's; autoimmune hepatitis; and chronic pain syndrome presenting with rectal bleeding.He has had a gradual decline. He struggled through the end of the year last year with deteriorating conditions, more blood and more gas. By Christmas, things weren't going well.   He has been tapering down off prednisone since January - he thought it was making his stomach hurt and making him bleed more, and he didn't think it was helping his digestion  . He slowly tapered off prednisone off 2 weeks and thinks he didn't have withdrawal but he noticed feeling a little hot after coming off of it (not febrile, just "hot"). He would have cold chills before he would pass blood. Last night, he had about 7 episodes in a 2 hour period. He was nauseated, had light-headedness, had difficulty navigating back to his bedroom.  He acknowledges ongoing depression. He has had multiple suicide attempts. He reports that he was addicted to drugs and alcohol for 15 years and stopped for 2 years before being diagnosed with Crohn's. Both ways were horrible, but this actually makes him feel worse. He denies active SI/HI but does have suicidal thoughts.  HOSPITAL COURSE:   Crohn's -Patient with h/o Crohn's, not on chronic controlling medicationsat this  time and has weaned himself off steroids -He has failed outpatient management and presents for GI consultation and assistance -Presented with diarrhea and weight loss over last few months -Pain control with low-dose morphineand Vicodin(has h/o polysubstanceso will need to monitor this closely) -C diff toxin positive, but toxigenic C diff PCR negative.  started on vancomycin , which was discontinued following his colonoscopy which was negative for C. difficile.    - prepped for   colonoscopy  4/1 (he did no drink prep last night) - entyvio antibody pending at the time of discharge, Colonoscopy Impression: 4/1  Crohn's ileocolitis (mild to moderately active throughout the colon, visualized TI). - Persisently patent IC valve, possibly scarred open. - No signs of C. difficile infection. - Soft, rubbery perirectal nodule is NOT an perirectal abscess  gastroenterology recommends Stopped  oral vancomycin, he does not have C. difficile infection. Will stop GI precautions as well. - patient refusing  steroids  (prednisone 75m once daily).Does not agree with Dr GCarlean Purlor Dr JArdis Hughs, also refused lialda - Will discuss with Dr. GCarlean Purlabout changing him to remicade from entyvio (awaiting Antibodies for now). Patient eager to be discharged home as not wanting to eat hospital food   Hypoglycemia: Likely in setting of poor PO intake and NPO status for preparation for colonoscopy.   Completed D 5,    Poor PO intake: seen by dietician and related to mood/limited food acceptance.   -at risk for refeeding, follow mag, potassium, phosphorus daily -continue to encourage PO intake, started on a regular diet by GI but refused to  eat    MDD  Anxiety -Patient with h/o depression with suicidal ideation - Per admitting provider, he continues to have suicidal thoughts but denied plan or the energy to execute a plan -He has weaned himself off medications (most recently Remeron) -Psych c/s for severe  depression with suicidal thoughts.  GI also requested c/s as he appears to have significant anxiety affecting his treatment of his crohn's.    Autoimmune hepatitis-PSC -Normal LFTs  -Followed by GI -Previously scheduled for MRI, may need while here but will defer to GI  Chronic pain with h/o polysubstance abuse -Continue Norco and add prn IV morphine -Monitor for overuse or other behaviors concerning for drug-seeking  Grover's disease -Pt with hx of grover's disease -He has a rash on all of his extremities, worse on RLE.  Could be grover's disease? But this typically is primarily the torso.   -will need dermatology follow up -start on triamcinolone/eucerin  HTN - BP well controlled, on low side  Left LE swelling Left lower extremity venous Doppler negative for DVT    Discharge Exam:   Blood pressure 109/67, pulse 90, temperature 98 F (36.7 C), temperature source Oral, resp. rate 16, height 6' 2"  (1.88 m), weight 74.7 kg, SpO2 96 %.  Cardiovascular: Heart sounds show a regular rate, and rhythm.  Lungs: Clear to auscultation bilaterally  Abdomen: Soft, nontender, nondistended Neurological: Alert and oriented 3. Moves all extremities 4. Cranial nerves II through XII grossly intact. Skin: Warm and dry. No rashes or lesions. Extremities: Diffuse erythematous papular rash, most prominent on LLE, but present on all 4 extremities and torso.  No oral involvement.    Follow-up Information    [name], MD. Call.   Specialty:  Family Medicine Why:  call for f/u in 3-5 days Contact information: [address] [place] [address] [phone]        [name], MD. Call.   Specialty:  Gastroenterology Contact information: [address] [place] [address] [phone]        Elby Beck, FNP .   Specialties:  Nurse Practitioner, Family Medicine Contact information: [place] [address] [phone]            Signed: Reyne Dumas 06/01/2018, 10:24 AM      Time needed to  prepare  discharge, discussed with the patient and family 35 minutes

## 2018-06-01 NOTE — Telephone Encounter (Signed)
Patient's mom called.  She would like to discuss why [name] is not a candidate for colectomy.  He is convinced that this will solve his problems.  She is asking for help with the patient's anxiety, his lack of eating, and would like recommendations for a RN to stay with him at night.  Patient has refused to take lialda or prednisone. She is requesting nursing help for nutrition and medications (patient has refused his GI meds).  Mom advised that she could speak with the patient's primary care about possible home health assistance.

## 2018-06-01 NOTE — Telephone Encounter (Signed)
I spoke to mom about situation. [name] would benefit from psychiatric care and psychotherapy if he will participate. I do not think he will qualify for home health.  She will ask PCP about this tomorrow.  I will follow-up.  He is convinced his medications are making him worse and will not take them.

## 2018-06-01 NOTE — Telephone Encounter (Signed)
Pt's mother called and is concerned about pt.  She would like an update since she is not allowed in the hospital.

## 2018-06-01 NOTE — Progress Notes (Signed)
Patient given discharge instructions, and verbalized an understanding of all discharge instructions.  Patient agrees with discharge plan, and is being discharged in stable medical condition.  Patient given transportation via wheelchair. 

## 2018-06-02 ENCOUNTER — Encounter (HOSPITAL_COMMUNITY): Payer: Self-pay | Admitting: Gastroenterology

## 2018-06-02 LAB — VITAMIN E
VITAMIN E (ALPHA TOCOPHEROL): 12.8 mg/L (ref 7.0–25.1)
Vitamin E(Gamma Tocopherol): 0.6 mg/L (ref 0.5–5.5)

## 2018-06-02 LAB — VITAMIN A: Vitamin A (Retinoic Acid): 5.9 ug/dL — ABNORMAL LOW (ref 20.1–62.0)

## 2018-06-04 LAB — MISC LABCORP TEST (SEND OUT): LABCORP TEST CODE: 504567

## 2018-06-13 ENCOUNTER — Other Ambulatory Visit: Payer: Self-pay

## 2018-06-13 DIAGNOSIS — K602 Anal fissure, unspecified: Secondary | ICD-10-CM

## 2018-06-14 NOTE — Telephone Encounter (Signed)
Left him a detailed message that we need to do a telephone/zoom/Webex visit tomorrow if possible at 11:30AM so we can get his medicine refilled. I told him to call me back ASAP.

## 2018-06-14 NOTE — Telephone Encounter (Signed)
Dad called back and we're going to do his visit tomorrow on the phone.

## 2018-06-14 NOTE — Telephone Encounter (Signed)
[name] needs to set up an appointment to see how he is and what is going on before I refill things

## 2018-06-15 ENCOUNTER — Other Ambulatory Visit: Payer: Self-pay

## 2018-06-15 ENCOUNTER — Encounter: Payer: Self-pay | Admitting: Internal Medicine

## 2018-06-15 ENCOUNTER — Ambulatory Visit (INDEPENDENT_AMBULATORY_CARE_PROVIDER_SITE_OTHER): Payer: Medicare Other | Admitting: Internal Medicine

## 2018-06-15 DIAGNOSIS — F5082 Avoidant/restrictive food intake disorder: Secondary | ICD-10-CM | POA: Insufficient documentation

## 2018-06-15 DIAGNOSIS — K754 Autoimmune hepatitis: Secondary | ICD-10-CM

## 2018-06-15 DIAGNOSIS — K602 Anal fissure, unspecified: Secondary | ICD-10-CM | POA: Diagnosis not present

## 2018-06-15 DIAGNOSIS — K50813 Crohn's disease of both small and large intestine with fistula: Secondary | ICD-10-CM | POA: Diagnosis not present

## 2018-06-15 DIAGNOSIS — R634 Abnormal weight loss: Secondary | ICD-10-CM | POA: Diagnosis not present

## 2018-06-15 HISTORY — DX: Avoidant/restrictive food intake disorder: F50.82

## 2018-06-15 MED ORDER — HYDROCODONE-ACETAMINOPHEN 5-325 MG PO TABS: 1.0000 | ORAL_TABLET | Freq: Four times a day (QID) | ORAL | 0 refills | Status: DC | PRN

## 2018-06-15 NOTE — Assessment & Plan Note (Signed)
I do not think this is an issue right now

## 2018-06-15 NOTE — Patient Instructions (Signed)
[name] it was good to talk to you and your mom today.  I hope you can continue to build on this and take 20 mg of prednisone daily and get back on your Entyvio.  I refilled your hydrocodone.  We will schedule a follow-up with you in 2 to 3 weeks.  Keep trying to eat more and take a multivitamin as well and I would recommend you resume your other medications.  Contact me sooner if you need me.  I appreciate the opportunity to care for you.  [name], MD, [facility]

## 2018-06-15 NOTE — Assessment & Plan Note (Signed)
Considering this is a possibility in the background of his problems.

## 2018-06-15 NOTE — Progress Notes (Signed)
TELEHEALTH ENCOUNTER IN SETTING OF COVID-19 PANDEMIC - REQUESTED BY PATIENT SERVICE PROVIDED BY TELEMEDECINE - TYPE: telephone PATIENT LOCATION: Home PATIENT HAS CONSENTED TO TELEHEALTH VISIT PROVIDER LOCATION: OFFICE PARTICIPANTS OTHER THAN PATIENT:Mother TIME SPENT ON CALL:25 mins    [name] 53 y.o. 03/25/1965 [id number]  Assessment & Plan:  CROHN'S DISEASE, LARGE AND SMALL INTESTINES Seems about the same.  He has not been compliant with his treatment.  Hopefully he will resume all of his therapy.  He seems to be coming around to that after a difficult time similar to what he went through several years ago.  I have encouraged him to restart his Entyvio and to go to 20 mg of prednisone daily and I will try to get a follow-up with him in 2 to 3 weeks.  His thought process that his medications were causing his problems has been a big problem lately, it seems like maybe he is changing his mind about that.  It also seems like he may have A RF ID.  He does not have nor is he willing to have a therapist but maybe we can make some progress on that down the road line.  I have decided to refill his hydrocodone because he feels so bad with the hopes that he will continue to use this responsibly as he did in the past, and that he will start to feel a bit better and be willing to get back on his treatment plan as before.  Autoimmune hepatitis-PSC overlap I do not think this is an issue right now  Loss of weight According to the history he seems to be leveling off a bit we do not have true weight today.  His food intake problems are difficult to interpret here given that he does have organic disease but again I wonder about ARFID  Avoidant-restrictive food intake disorder (ARFID) ? Considering this is a possibility in the background of his problems.  CC: [name] Cruel, MD   Subjective:   Chief Complaint: hydrocodone refill follow-up of Crohn's disease and other problems  HPI  [name] requested a refill of his hydrocodone yesterday so I requested that he schedule an office visit so he has done so we are conducting a telephone visit.  He has multiple medical problems that include Crohn's disease of the small and large intestine, autoimmune hepatitis primary sclerosing cholangitis overlap, iron deficiency anemia due to chronic blood loss, perianal fistula associated with his Crohn's disease.  He had been relatively stable up until the last couple of months when he decided that his medications were causing his problems and he stopped taking them.  He has a history of depression and suicidal ideation and attempt in the past but he denies that and that does not appear to have been an issue lately.  He was in the hospital recently at the end of March in the beginning of April.  He was evaluated by Dr. Ardis Hughs and a colonoscopy demonstrated inflammatory changes and pseudopolyps, fairly similar to what colonoscopy had shown in March 2019 as well.  He was prescribed Lialda and had Entyvio antibodies and levels done that showed low antibodies and low levels.  He had been more than 8 weeks without his last Entyvio dose.  He has seen primary care, he has declined psychiatric intervention.  Today he tells me he has concluded that his theory of his medicines causing his problems was wrong because he is not really any better and he is quite sore  all over and just achy and would like to resume his hydrocodone, he gets 30 tablets a month there was up until December.  He took 5 mg of prednisone today.  He says he has not had the energy to go restart his Entyvio but is willing.  I have spoken to his mother who says she keeps his medications and dispenses them and does not believe he would be able to get a hold bottle of hydrocodone in his possession at one time.  He reports having cramps and diarrhea w/less rectal bleeding.  He is eating chicken and biscuits and eating a bit more it seems.  He says 160#  now Wt Readings from Last 3 Encounters:  05/31/18 164 lb 10.9 oz (74.7 kg)  04/25/18 170 lb 9.6 oz (77.4 kg)  04/07/18 178 lb (80.7 kg)    Allergies  Allergen Reactions  . Asacol [Mesalamine] Diarrhea    abd pain  . Flagyl [Metronidazole] Diarrhea    abd pain  . Azathioprine Diarrhea    abd pain  . Gluten Meal Diarrhea  . Mycophenolate Mofetil Diarrhea    abd pain  . Other     NUTS; constipation  . Wheat Bran Diarrhea    Constipation; flatulence; abd pain  . Tape Itching and Rash    Please use "paper" tape   Only taking prednisone 5 mg daily right now - prior list below   Current Outpatient Medications:  .  AMBULATORY NON FORMULARY MEDICATION, Dilitazem 2% cream  Apply pea size amount to rectum twice daily, Disp: 30 g, Rfl: 3 .  ascorbic acid (VITAMIN C) 1000 MG tablet, Take 2,000 mg by mouth daily. , Disp: , Rfl:  .  Calcium Carb-Cholecalciferol (CALCIUM 1000 + D) 1000-800 MG-UNIT TABS, Take by mouth. Takes 2 (2054m) tabs bid, Disp: , Rfl:  .  cholecalciferol (VITAMIN D) 1000 UNITS tablet, Take 4,000 Units by mouth 2 (two) times daily. , Disp: , Rfl:  .  clonazePAM (KLONOPIN) 0.5 MG tablet, Take 1 tablet (0.5 mg total) by mouth at bedtime as needed (insomnia)., Disp: 20 tablet, Rfl: 0 .  HYDROcodone-acetaminophen (NORCO/VICODIN) 5-325 MG tablet, Take 1-2 tablets by mouth every 6 (six) hours as needed for moderate pain. Written rx printed in advance May fill on or after 02/24/2018, Disp: 30 tablet, Rfl: 0 .  loperamide (IMODIUM) 2 MG capsule, Take 1 capsule (2 mg total) by mouth 2 (two) times daily., Disp: 30 capsule, Rfl: 0 .  mesalamine (LIALDA) 1.2 g EC tablet, Take 4 tablets (4.8 g total) by mouth daily with breakfast., Disp: 120 tablet, Rfl: 1 .  mirtazapine (REMERON) 30 MG tablet, Take 30 mg by mouth at bedtime., Disp: , Rfl:  .  Multiple Vitamin (MULTIVITAMIN WITH MINERALS) TABS tablet, Take 1 tablet by mouth daily., Disp: 30 tablet, Rfl: 1 .  ondansetron (ZOFRAN) 4 MG  tablet, Take 1 tablet (4 mg total) by mouth every 6 (six) hours as needed for nausea., Disp: 20 tablet, Rfl: 0 .  potassium chloride 20 MEQ/15ML (10%) SOLN, Take 30 mLs (40 mEq total) by mouth daily., Disp: 473 mL, Rfl: 0 .  saccharomyces boulardii (FLORASTOR) 250 MG capsule, Take 1 capsule (250 mg total) by mouth 2 (two) times daily., Disp: 30 capsule, Rfl: 0 .  triamcinolone cream (KENALOG) 0.1 %, Apply 1-2 application topically daily as needed for other., Disp: , Rfl:  .  Vedolizumab (ENTYVIO IV), Inject into the vein. Every 2 months, Disp: , Rfl:   Past Medical History:  Diagnosis Date  . Allergy   . Anal fistula   . Anxiety   . 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 Autoimmune hepatitis ([place]) 01/19/2013  . Cancer of skin of neck   . Cataract   . Chronic mesenteric ischemia ([place])   . Chronic pain syndrome 07/09/2016  . Crohn's disease of small and large intestines ([place])    followed by dr Glendell Docker Tryniti Laatsch  . Dairy product intolerance   . Diarrhea, functional   . Family history of adverse reaction to anesthesia   . Grover's disease    transient acantholytic dermatosis  . History of alcohol abuse   . History of basal cell carcinoma excision    2013 left leg  . History of Clostridium difficile    10/ 2014  . History of multiple concussions    x6   last one Jan 2017 per pt--  no residual  . History of substance abuse ([place])    quit 1997 per pt  . History of suicide attempt    05-18-2012  overdose /  failure to thrive  . Hypercholesterolemia   . Iron deficiency anemia due to chronic blood loss   . Major depression, recurrent, chronic ([place])   . Osteopenia   . Personal history of adenomatous colonic polyps 12/2010, 03/2012   12/2010 - 8 mm serrated adenoma of rectum  . Portal vein thrombosis 03/21/2015   right  . Primary sclerosing cholangitis    ? hepatitis overlap - liver bx x 2 and MRCP  . Seasonal allergies   . Substance abuse ([place]) 1997   Alcohol  . Vitamin A deficiency  05/23/2018  . Vitamin C deficiency 05/23/2018   Past Surgical History:  Procedure Laterality Date  . ABDOMINAL AORTAGRAM N/A 03/29/2012   Procedure: ABDOMINAL Maxcine Ham;  Surgeon: Serafina Mitchell, MD;  Location: [facility] CATH LAB;  Service: Cardiovascular;  Laterality: N/A;  . ADENOIDECTOMY  age 73  . BIOPSY  05/31/2018   Procedure: BIOPSY;  Surgeon: Milus Banister, MD;  Location: [facility];  Service: Endoscopy;;  . CATARACT EXTRACTION W/ INTRAOCULAR LENS  IMPLANT, BILATERAL  2009  . COLONOSCOPY  2001, 05/02/2003, 01/28/11   2012: Right colon Crohn's, rectal polyp  . COLONOSCOPY  03/31/2012   Procedure: COLONOSCOPY;  Surgeon: Jerene Bears, MD;  Location: [facility];  Service: Gastroenterology;  Laterality: N/A;  . COLONOSCOPY WITH PROPOFOL N/A 05/31/2018   Procedure: COLONOSCOPY WITH PROPOFOL;  Surgeon: Milus Banister, MD;  Location: [facility] ENDOSCOPY;  Service: Endoscopy;  Laterality: N/A;  . ESOPHAGOGASTRODUODENOSCOPY  01/28/11   Normal  . FOOT SURGERY Right age 63  . MOHS SURGERY Left 11/2013   left ankle parakerotosis   . PERCUTANEOUS LIVER BIOPSY  2007 and 2008  . PILONIDAL CYST EXCISION  age 11  . PLACEMENT OF SETON N/A 11/06/2015   Procedure: PLACEMENT OF SETON;  Surgeon: [name], MD;  Location: [facility];  Service: General;  Laterality: N/A;  . UPPER GASTROINTESTINAL ENDOSCOPY     Social History   Social History Narrative   Single, history of substance abuse in recovery   Previously occupied on medical disability   1 child    Elderly parents involved and he helps them   1 brother in [place]   family history includes Allergies in his father; Breast cancer in his mother; Clotting disorder in his father; Heart disease in his father; Stomach cancer in his mother; Thyroid cancer in his father.  Review of Systems As per HPI

## 2018-06-15 NOTE — Telephone Encounter (Signed)
Dr Carlean Purl sent in this refill.

## 2018-06-15 NOTE — Assessment & Plan Note (Signed)
According to the history he seems to be leveling off a bit we do not have true weight today.  His food intake problems are difficult to interpret here given that he does have organic disease but again I wonder about ARFID

## 2018-06-15 NOTE — Assessment & Plan Note (Signed)
Seems about the same.  He has not been compliant with his treatment.  Hopefully he will resume all of his therapy.  He seems to be coming around to that after a difficult time similar to what he went through several years ago.  I have encouraged him to restart his Entyvio and to go to 20 mg of prednisone daily and I will try to get a follow-up with him in 2 to 3 weeks.  His thought process that his medications were causing his problems has been a big problem lately, it seems like maybe he is changing his mind about that.  It also seems like he may have A RF ID.  He does not have nor is he willing to have a therapist but maybe we can make some progress on that down the road line.  I have decided to refill his hydrocodone because he feels so bad with the hopes that he will continue to use this responsibly as he did in the past, and that he will start to feel a bit better and be willing to get back on his treatment plan as before.

## 2018-07-06 ENCOUNTER — Ambulatory Visit (INDEPENDENT_AMBULATORY_CARE_PROVIDER_SITE_OTHER): Payer: Medicare Other | Admitting: Internal Medicine

## 2018-07-06 ENCOUNTER — Other Ambulatory Visit: Payer: Self-pay

## 2018-07-06 ENCOUNTER — Encounter: Payer: Self-pay | Admitting: Internal Medicine

## 2018-07-06 VITALS — Ht 74.0 in | Wt 155.0 lb

## 2018-07-06 DIAGNOSIS — F332 Major depressive disorder, recurrent severe without psychotic features: Secondary | ICD-10-CM | POA: Diagnosis not present

## 2018-07-06 DIAGNOSIS — K754 Autoimmune hepatitis: Secondary | ICD-10-CM

## 2018-07-06 DIAGNOSIS — F5082 Avoidant/restrictive food intake disorder: Secondary | ICD-10-CM

## 2018-07-06 DIAGNOSIS — K838 Other specified diseases of biliary tract: Secondary | ICD-10-CM | POA: Diagnosis not present

## 2018-07-06 DIAGNOSIS — K50811 Crohn's disease of both small and large intestine with rectal bleeding: Secondary | ICD-10-CM | POA: Diagnosis not present

## 2018-07-06 DIAGNOSIS — R634 Abnormal weight loss: Secondary | ICD-10-CM

## 2018-07-06 DIAGNOSIS — D500 Iron deficiency anemia secondary to blood loss (chronic): Secondary | ICD-10-CM | POA: Diagnosis not present

## 2018-07-06 NOTE — Assessment & Plan Note (Signed)
Making a little progress back on 10 mg of prednisone daily.  I have encouraged him to get back on his Entyvio.  Anticipate follow-up in about a month with labs before.

## 2018-07-06 NOTE — Assessment & Plan Note (Signed)
He is eating a bit more by history but still seems to have significant weight loss.

## 2018-07-06 NOTE — Assessment & Plan Note (Signed)
Not suicidal not on his medications seems a bit improved.  Question how much of his appetite disturbance and eating is related to this.

## 2018-07-06 NOTE — Assessment & Plan Note (Signed)
Wt Readings from Last 3 Encounters:  07/06/18 155 lb (70.3 kg)  05/31/18 164 lb 10.9 oz (74.7 kg)  04/25/18 170 lb 9.6 oz (77.4 kg)

## 2018-07-06 NOTE — Patient Instructions (Addendum)
I am glad to hear that you are making some small steps towards improvement [name].  I still recommend that you get back on the mirtazapine and take your Entyvio.  Continue to hydrate and eat more.  Please come on around June 2 and have labs done.  We will arrange a follow-up visit for you after that when  the June schedule comes out,  Hopefully in person.  I appreciate the opportunity to care for you. [name], MD, [facility]

## 2018-07-06 NOTE — Progress Notes (Signed)
TELEHEALTH ENCOUNTER IN SETTING OF COVID-19 PANDEMIC - REQUESTED BY PATIENT SERVICE PROVIDED BY TELEMEDECINE - TYPE: Telephone, A/V not possible PATIENT LOCATION: Home PATIENT HAS CONSENTED TO TELEHEALTH VISIT PROVIDER LOCATION: OFFICE REFERRING PROVIDER: [name], MD  PARTICIPANTS OTHER THAN PATIENT: None TIME SPENT ON CALL: 15 minutes    VERSIE FLEENER 53 y.o. Dec 30, 1965 [id number]  Assessment & Plan:  CROHN'S DISEASE, LARGE AND SMALL INTESTINES Making a little progress back on 10 mg of prednisone daily.  I have encouraged him to get back on his Entyvio.  Anticipate follow-up in about a month with labs before.  Dilated intrahepatic bile ducts This needs follow-up.  However he is not ready to pursue that.  Talk to him about it with next visit. Recheck LFTs prior to that.  Major depressive disorder, recurrent episode, severe ([place]) Not suicidal not on his medications seems a bit improved.  Question how much of his appetite disturbance and eating is related to this.    Avoidant-restrictive food intake disorder (ARFID) ? He is eating a bit more by history but still seems to have significant weight loss.  Anemia due to chronic blood loss Follow-up CBC and ferritin in about a month  Loss of weight Wt Readings from Last 3 Encounters:  07/06/18 155 lb (70.3 kg)  05/31/18 164 lb 10.9 oz (74.7 kg)  04/25/18 170 lb 9.6 oz (77.4 kg)       [name] remains a challenge to care for.  He is making some baby steps towards improvement and adherence to therapy.  We will continue to work with him towards these goals.  I appreciate the opportunity to care for this patient. CC: [name], MD   Subjective:   Chief Complaint: Follow-up of Crohn's disease and multiple other issues  HPI [name] reports that he is eating a little bit more and having more energy and is up watching TV in the afternoons with his parents.  He continues with gas cramps diarrhea  usually not bloody but occasionally so.  He is on 10 mg of prednisone, he is using some Klonopin at bedtime to try to sleep and he is still taking 1 hydrocodone-acetaminophen on about a daily basis.  He is not suicidal.  Affect still sounds flat though he says he would have been more animated had I talked to him yesterday he was having a good day.  He has not yet gone back and started on his Entyvio.  He is thinking about that and thinks he is closer to being able to do that.  No fevers.  Eating rice biscuits and 1 chicken breast a day.  That is more than he was.  Wt Readings from Last 3 Encounters:  07/06/18 155 lb (70.3 kg)  05/31/18 164 lb 10.9 oz (74.7 kg)  04/25/18 170 lb 9.6 oz (77.4 kg)    Allergies  Allergen Reactions  . Asacol [Mesalamine] Diarrhea    abd pain  . Flagyl [Metronidazole] Diarrhea    abd pain  . Azathioprine Diarrhea    abd pain  . Gluten Meal Diarrhea  . Mycophenolate Mofetil Diarrhea    abd pain  . Other     NUTS; constipation  . Wheat Bran Diarrhea    Constipation; flatulence; abd pain  . Tape Itching and Rash    Please use "paper" tape   Current Meds  Medication Sig  . clonazePAM (KLONOPIN) 0.5 MG tablet Take 1 tablet (0.5 mg total) by mouth at bedtime as needed (  insomnia).  Marland Kitchen HYDROcodone-acetaminophen (NORCO/VICODIN) 5-325 MG tablet Take 1-2 tablets by mouth every 6 (six) hours as needed for moderate pain or severe pain.  . predniSONE (DELTASONE) 20 MG tablet Take 10 mg by mouth daily with breakfast.  . [DISCONTINUED] mesalamine (LIALDA) 1.2 g EC tablet Take 4 tablets (4.8 g total) by mouth daily with breakfast.  . [DISCONTINUED] potassium chloride 20 MEQ/15ML (10%) SOLN Take 30 mLs (40 mEq total) by mouth daily.  . [DISCONTINUED] saccharomyces boulardii (FLORASTOR) 250 MG capsule Take 1 capsule (250 mg total) by mouth 2 (two) times daily.   Past Medical History:  Diagnosis Date  . Allergy   . Anal fistula   . Anxiety   . Arthritis    ? of  migratory arthritis  . Autoimmune hepatitis ([place]) 01/19/2013  . Cancer of skin of neck   . Cataract   . Chronic mesenteric ischemia ([place])   . Chronic pain syndrome 07/09/2016  . Crohn's disease of small and large intestines ([place])    followed by dr Glendell Docker Mayu Ronk  . Dairy product intolerance   . Diarrhea, functional   . Family history of adverse reaction to anesthesia   . Grover's disease    transient acantholytic dermatosis  . History of alcohol abuse   . History of basal cell carcinoma excision    2013 left leg  . History of Clostridium difficile    10/ 2014  . History of multiple concussions    x6   last one Jan 2017 per pt--  no residual  . History of substance abuse ([place])    quit 1997 per pt  . History of suicide attempt    05-18-2012  overdose /  failure to thrive  . Hypercholesterolemia   . Iron deficiency anemia due to chronic blood loss   . Major depression, recurrent, chronic ([place])   . Osteopenia   . Personal history of adenomatous colonic polyps 12/2010, 03/2012   12/2010 - 8 mm serrated adenoma of rectum  . Portal vein thrombosis 03/21/2015   right  . Primary sclerosing cholangitis    ? hepatitis overlap - liver bx x 2 and MRCP  . Seasonal allergies   . Substance abuse ([place]) 1997   Alcohol  . Vitamin A deficiency 05/23/2018  . Vitamin C deficiency 05/23/2018   Past Surgical History:  Procedure Laterality Date  . ABDOMINAL AORTAGRAM N/A 03/29/2012   Procedure: ABDOMINAL Maxcine Ham;  Surgeon: Serafina Mitchell, MD;  [facility] CATH LAB;  Service: Cardiovascular;  Laterality: N/A;  . ADENOIDECTOMY  age 48  . BIOPSY  05/31/2018   Procedure: BIOPSY;  Surgeon: Milus Banister, MD;  Location: [facility];  Service: Endoscopy;;  . CATARACT EXTRACTION W/ INTRAOCULAR LENS  IMPLANT, BILATERAL  2009  . COLONOSCOPY  2001, 05/02/2003, 01/28/11   2012: Right colon Crohn's, rectal polyp  . COLONOSCOPY  03/31/2012   Procedure: COLONOSCOPY;  Surgeon: Jerene Bears, MD;  Location: [facility];  Service: Gastroenterology;  Laterality: N/A;  . COLONOSCOPY WITH PROPOFOL N/A 05/31/2018   Procedure: COLONOSCOPY WITH PROPOFOL;  Surgeon: Milus Banister, MD;  Location: [facility] ENDOSCOPY;  Service: Endoscopy;  Laterality: N/A;  . ESOPHAGOGASTRODUODENOSCOPY  01/28/11   Normal  . FOOT SURGERY Right age 66  . MOHS SURGERY Left 11/2013   left ankle parakerotosis   . PERCUTANEOUS LIVER BIOPSY  2007 and 2008  . PILONIDAL CYST EXCISION  age 66  . PLACEMENT OF SETON N/A 11/06/2015   Procedure: PLACEMENT OF SETON;  Surgeon: [name], MD;  Location: [facility];  Service: General;  Laterality: N/A;  . UPPER GASTROINTESTINAL ENDOSCOPY     Social History   Social History Narrative   Single, history of substance abuse in recovery   Previously occupied on medical disability   1 child    Elderly parents involved and he helps them   1 brother in [place]   family history includes Allergies in his father; Breast cancer in his mother; Clotting disorder in his father; Heart disease in his father; Stomach cancer in his mother; Thyroid cancer in his father.   Review of Systems See HPI  15 minutes time spent with patient > half in counseling coordination of care

## 2018-07-06 NOTE — Assessment & Plan Note (Signed)
Follow-up CBC and ferritin in about a month

## 2018-07-06 NOTE — Assessment & Plan Note (Signed)
This needs follow-up.  However he is not ready to pursue that.  Talk to him about it with next visit. Recheck LFTs prior to that.

## 2018-07-10 MED ORDER — DICYCLOMINE HCL 20 MG PO TABS: 20.0000 mg | ORAL_TABLET | Freq: Three times a day (TID) | ORAL | 1 refills | Status: DC

## 2018-07-10 NOTE — Telephone Encounter (Signed)
[name] is having difficulty sleeping due to gas pains.  He thinks the hydrocodone made 1 type of pain intensifier appear and that seemed to stop, in the right upper quadrant area after he stopped using that.  He can belch and feel better.  The pain now that is awakening him is below the navel.  He is requesting an antispasmodic to try so we will try dicyclomine 20 mg before meals and at bedtime.  #120 with 1 refill prescribed.  He is to contact me and let me know how that works or not within a few days.

## 2018-07-19 ENCOUNTER — Emergency Department (HOSPITAL_COMMUNITY): Payer: Medicare Other

## 2018-07-19 ENCOUNTER — Encounter (HOSPITAL_COMMUNITY): Payer: Self-pay

## 2018-07-19 ENCOUNTER — Inpatient Hospital Stay (HOSPITAL_COMMUNITY)
Admission: EM | Admit: 2018-07-19 | Discharge: 2018-07-21 | DRG: 386 | Disposition: A | Discharge status: Hospice | Payer: Medicare Other | Attending: Internal Medicine | Admitting: Internal Medicine

## 2018-07-19 ENCOUNTER — Other Ambulatory Visit: Payer: Self-pay

## 2018-07-19 ENCOUNTER — Telehealth: Payer: Self-pay

## 2018-07-19 DIAGNOSIS — Z9119 Patient's noncompliance with other medical treatment and regimen: Secondary | ICD-10-CM | POA: Diagnosis not present

## 2018-07-19 DIAGNOSIS — D509 Iron deficiency anemia, unspecified: Secondary | ICD-10-CM | POA: Diagnosis present

## 2018-07-19 DIAGNOSIS — K6389 Other specified diseases of intestine: Secondary | ICD-10-CM | POA: Diagnosis not present

## 2018-07-19 DIAGNOSIS — Z7951 Long term (current) use of inhaled steroids: Secondary | ICD-10-CM

## 2018-07-19 DIAGNOSIS — E538 Deficiency of other specified B group vitamins: Secondary | ICD-10-CM | POA: Diagnosis present

## 2018-07-19 DIAGNOSIS — Z66 Do not resuscitate: Secondary | ICD-10-CM | POA: Diagnosis present

## 2018-07-19 DIAGNOSIS — Z79899 Other long term (current) drug therapy: Secondary | ICD-10-CM | POA: Diagnosis not present

## 2018-07-19 DIAGNOSIS — K5010 Crohn's disease of large intestine without complications: Secondary | ICD-10-CM | POA: Diagnosis not present

## 2018-07-19 DIAGNOSIS — R634 Abnormal weight loss: Secondary | ICD-10-CM | POA: Diagnosis present

## 2018-07-19 DIAGNOSIS — K649 Unspecified hemorrhoids: Secondary | ICD-10-CM | POA: Diagnosis present

## 2018-07-19 DIAGNOSIS — Z515 Encounter for palliative care: Secondary | ICD-10-CM | POA: Diagnosis present

## 2018-07-19 DIAGNOSIS — F419 Anxiety disorder, unspecified: Secondary | ICD-10-CM | POA: Diagnosis present

## 2018-07-19 DIAGNOSIS — R110 Nausea: Secondary | ICD-10-CM

## 2018-07-19 DIAGNOSIS — Z1159 Encounter for screening for other viral diseases: Secondary | ICD-10-CM

## 2018-07-19 DIAGNOSIS — K50811 Crohn's disease of both small and large intestine with rectal bleeding: Secondary | ICD-10-CM | POA: Diagnosis not present

## 2018-07-19 DIAGNOSIS — K56600 Partial intestinal obstruction, unspecified as to cause: Secondary | ICD-10-CM | POA: Diagnosis not present

## 2018-07-19 DIAGNOSIS — R627 Adult failure to thrive: Secondary | ICD-10-CM | POA: Diagnosis present

## 2018-07-19 DIAGNOSIS — F332 Major depressive disorder, recurrent severe without psychotic features: Secondary | ICD-10-CM | POA: Diagnosis present

## 2018-07-19 DIAGNOSIS — R197 Diarrhea, unspecified: Secondary | ICD-10-CM

## 2018-07-19 DIAGNOSIS — G4700 Insomnia, unspecified: Secondary | ICD-10-CM | POA: Diagnosis present

## 2018-07-19 DIAGNOSIS — G894 Chronic pain syndrome: Secondary | ICD-10-CM | POA: Diagnosis present

## 2018-07-19 DIAGNOSIS — Z915 Personal history of self-harm: Secondary | ICD-10-CM | POA: Diagnosis not present

## 2018-07-19 DIAGNOSIS — Z20828 Contact with and (suspected) exposure to other viral communicable diseases: Secondary | ICD-10-CM | POA: Diagnosis not present

## 2018-07-19 DIAGNOSIS — Z7952 Long term (current) use of systemic steroids: Secondary | ICD-10-CM | POA: Diagnosis not present

## 2018-07-19 DIAGNOSIS — Z87891 Personal history of nicotine dependence: Secondary | ICD-10-CM

## 2018-07-19 DIAGNOSIS — Z7189 Other specified counseling: Secondary | ICD-10-CM | POA: Diagnosis not present

## 2018-07-19 DIAGNOSIS — Z9114 Patient's other noncompliance with medication regimen: Secondary | ICD-10-CM

## 2018-07-19 DIAGNOSIS — R109 Unspecified abdominal pain: Secondary | ICD-10-CM | POA: Diagnosis not present

## 2018-07-19 DIAGNOSIS — Z7960 Long term (current) use of unspecified immunomodulators and immunosuppressants: Secondary | ICD-10-CM

## 2018-07-19 DIAGNOSIS — K5080 Crohn's disease of both small and large intestine without complications: Secondary | ICD-10-CM | POA: Diagnosis present

## 2018-07-19 DIAGNOSIS — F329 Major depressive disorder, single episode, unspecified: Secondary | ICD-10-CM | POA: Diagnosis not present

## 2018-07-19 LAB — CBC
HCT: 41.8 % (ref 39.0–52.0)
HCT: 37.3 % — ABNORMAL LOW (ref 39.0–52.0)
Hemoglobin: 13.2 g/dL (ref 13.0–17.0)
Hemoglobin: 11.5 g/dL — ABNORMAL LOW (ref 13.0–17.0)
MCH: 27.9 pg (ref 26.0–34.0)
MCH: 27.5 pg (ref 26.0–34.0)
MCHC: 31.6 g/dL (ref 30.0–36.0)
MCHC: 30.8 g/dL (ref 30.0–36.0)
MCV: 88.4 fL (ref 80.0–100.0)
MCV: 89.2 fL (ref 80.0–100.0)
Platelets: 280 10*3/uL (ref 150–400)
Platelets: 236 10*3/uL (ref 150–400)
RBC: 4.73 MIL/uL (ref 4.22–5.81)
RBC: 4.18 MIL/uL — ABNORMAL LOW (ref 4.22–5.81)
RDW: 15 % (ref 11.5–15.5)
RDW: 15 % (ref 11.5–15.5)
WBC: 8.3 10*3/uL (ref 4.0–10.5)
WBC: 4.2 10*3/uL (ref 4.0–10.5)
nRBC: 0 % (ref 0.0–0.2)
nRBC: 0 % (ref 0.0–0.2)

## 2018-07-19 LAB — URINALYSIS, ROUTINE W REFLEX MICROSCOPIC
Bilirubin Urine: NEGATIVE
Glucose, UA: NEGATIVE mg/dL
Hgb urine dipstick: NEGATIVE
Ketones, ur: NEGATIVE mg/dL
Leukocytes,Ua: NEGATIVE
Nitrite: NEGATIVE
Protein, ur: NEGATIVE mg/dL
Specific Gravity, Urine: 1.005 (ref 1.005–1.030)
pH: 8 (ref 5.0–8.0)

## 2018-07-19 LAB — CREATININE, SERUM
Creatinine, Ser: 0.82 mg/dL (ref 0.61–1.24)
GFR calc Af Amer: >60 mL/min (ref >60)
GFR calc non Af Amer: >60 mL/min (ref >60)

## 2018-07-19 LAB — SALICYLATE LEVEL: Salicylate Lvl: <7 mg/dL (ref 2.8–30.0)

## 2018-07-19 LAB — COMPREHENSIVE METABOLIC PANEL
ALT: 18 U/L (ref 0–44)
AST: 22 U/L (ref 15–41)
Albumin: 2.9 g/dL — ABNORMAL LOW (ref 3.5–5.0)
Alkaline Phosphatase: 293 U/L — ABNORMAL HIGH (ref 38–126)
Anion gap: 9 (ref 5–15)
BUN: 8 mg/dL (ref 6–20)
CO2: 31 mmol/L (ref 22–32)
Calcium: 8.3 mg/dL — ABNORMAL LOW (ref 8.9–10.3)
Chloride: 99 mmol/L (ref 98–111)
Creatinine, Ser: 0.89 mg/dL (ref 0.61–1.24)
GFR calc Af Amer: >60 mL/min (ref >60)
GFR calc non Af Amer: >60 mL/min (ref >60)
Glucose, Bld: 91 mg/dL (ref 70–99)
Potassium: 3.4 mmol/L — ABNORMAL LOW (ref 3.5–5.1)
Sodium: 139 mmol/L (ref 135–145)
Total Bilirubin: 0.5 mg/dL (ref 0.3–1.2)
Total Protein: 6 g/dL — ABNORMAL LOW (ref 6.5–8.1)

## 2018-07-19 LAB — ETHANOL: Alcohol, Ethyl (B): <10 mg/dL (ref <10)

## 2018-07-19 LAB — LIPASE, BLOOD: Lipase: 24 U/L (ref 11–51)

## 2018-07-19 LAB — SARS CORONAVIRUS 2 BY RT PCR (HOSPITAL ORDER, PERFORMED IN ~~LOC~~ HOSPITAL LAB): SARS Coronavirus 2: NEGATIVE

## 2018-07-19 LAB — SEDIMENTATION RATE: Sed Rate: 21 mm/hr — ABNORMAL HIGH (ref 0–16)

## 2018-07-19 LAB — C-REACTIVE PROTEIN: CRP: 4 mg/dL — ABNORMAL HIGH (ref <1.0)

## 2018-07-19 LAB — ACETAMINOPHEN LEVEL: Acetaminophen (Tylenol), Serum: <10 ug/mL — ABNORMAL LOW (ref 10–30)

## 2018-07-19 MED ORDER — CLONAZEPAM 0.5 MG PO TABS: 0.5000 mg | ORAL_TABLET | Freq: Every evening | ORAL | Status: DC | PRN

## 2018-07-19 MED ORDER — SODIUM CHLORIDE 0.9 % IV BOLUS
1000.0000 mL | Freq: Once | INTRAVENOUS | Status: AC
  Administered 2018-07-19: 1000 mL via INTRAVENOUS

## 2018-07-19 MED ORDER — LORAZEPAM 2 MG/ML IJ SOLN
0.5000 mg | Freq: Once | INTRAMUSCULAR | Status: AC
  Administered 2018-07-20: 0.5 mg via INTRAVENOUS
  Filled 2018-07-19: qty 1

## 2018-07-19 MED ORDER — POTASSIUM CHLORIDE IN NACL 20-0.9 MEQ/L-% IV SOLN
INTRAVENOUS | Status: DC
  Administered 2018-07-20: via INTRAVENOUS
  Filled 2018-07-19 (×3): qty 1000

## 2018-07-19 MED ORDER — ACETAMINOPHEN 650 MG RE SUPP: 650.0000 mg | Freq: Four times a day (QID) | RECTAL | Status: DC | PRN

## 2018-07-19 MED ORDER — ACETAMINOPHEN 325 MG PO TABS: 650.0000 mg | ORAL_TABLET | Freq: Four times a day (QID) | ORAL | Status: DC | PRN

## 2018-07-19 MED ORDER — HYDROCODONE-ACETAMINOPHEN 5-325 MG PO TABS: 1.0000 | ORAL_TABLET | ORAL | Status: DC | PRN

## 2018-07-19 MED ORDER — HEPARIN SODIUM (PORCINE) 5000 UNIT/ML IJ SOLN: 5000.0000 [IU] | Freq: Three times a day (TID) | INTRAMUSCULAR | Status: DC

## 2018-07-19 MED ORDER — MORPHINE SULFATE (PF) 2 MG/ML IV SOLN
2.0000 mg | INTRAVENOUS | Status: DC | PRN
  Administered 2018-07-19 – 2018-07-20 (×3): 2 mg via INTRAVENOUS
  Filled 2018-07-19 (×3): qty 1

## 2018-07-19 MED ORDER — FAMOTIDINE IN NACL 20-0.9 MG/50ML-% IV SOLN
20.0000 mg | Freq: Once | INTRAVENOUS | Status: AC
  Administered 2018-07-20: 20 mg via INTRAVENOUS
  Filled 2018-07-19: qty 50

## 2018-07-19 MED ORDER — LIP MEDEX EX OINT
TOPICAL_OINTMENT | CUTANEOUS | Status: AC
  Administered 2018-07-19: 1
  Filled 2018-07-19: qty 7

## 2018-07-19 MED ORDER — SODIUM CHLORIDE 0.9% FLUSH: 3.0000 mL | Freq: Once | INTRAVENOUS | Status: DC

## 2018-07-19 MED ORDER — ONDANSETRON HCL 4 MG/2ML IJ SOLN
4.0000 mg | Freq: Once | INTRAMUSCULAR | Status: AC
  Administered 2018-07-19: 4 mg via INTRAVENOUS
  Filled 2018-07-19: qty 2

## 2018-07-19 NOTE — ED Notes (Signed)
Spoke with Dr. Johnette Abraham. [place] he would like to discontinue the sitter order and does not think the patient needs a sitter.

## 2018-07-19 NOTE — ED Provider Notes (Signed)
  Physical Exam  BP (!) 153/85 (BP Location: Left Arm)   Pulse 79   Temp (!) 97.2 F (36.2 C) (Oral)   Resp 17   Ht 6' 2"  (1.88 m)   Wt 70.3 kg   SpO2 100%   BMI 19.90 kg/m   Physical Exam  ED Course/Procedures     Procedures  MDM  Care assumed at 3 pm.  Patient states that he has been suffering with Crohn's disease and had tried multiple treatments.  Patient basically feels that he wants to give up and does not want any further treatment.  He was sent in by GI doctor to get a endoscopy to evaluate for possible stomach ulcer.  Also [name] requested palliative care consult.  Signout pending labs and x-rays and likely admission for observation.  4:14 PM I talked to Palliative care team who will see patient tomorrow. GI plans for endoscopy for now. Will keep NPO after midnight. Xray showed possible early SBO but patient not vomiting. I ordered CT ab/pel but patient refused. Will admit for observation and plan to have GI perform endoscopy and see palliative care tomorrow.      Drenda Freeze, MD 07/19/18 [phone]

## 2018-07-19 NOTE — H&P (Signed)
History and Physical    [name]:[id number] DOB: [birth year] DOA: 07/19/2018  PCP: [name], MD  Patient coming from: Home  I have personally briefly reviewed patient's old medical records in [place]  Chief Complaint: Nausea, diarrhea, abdominal pain  HPI: [name] is a 53 y.o. male with medical history significant of Crohn's disease, autoimmune hepatitis, depression, chronic pain syndrome who presents from home with progressive abdominal discomfort associated with nausea and diarrhea over the past 2 weeks.  He also reports some lightheadedness given that he has not been able to maintain much oral intake.  His diarrhea is chronic and not significantly changed from his baseline.  Followed by gastroenterology, [name] outpatient.  Patient also endorses poor appetite and he only can eat chicken and rice flower biscuits.  He denies any recent medication changes or dietary changes.  He is very emotional and states "I just do not want to live like this anymore".  He believes that his therapy is related to his Crohn's disease are ineffective, and can only tolerate less than 10 mg of prednisone otherwise causes bleeding.  He reports continues to lose significant weight and he would like to transition more of his care to a more palliative/comfort measure.  Patient further states he lives with his parents at home and is very highly dependent on their care.  He feels that this is a burden on them.  He denies headache, no visual changes, no chest pain, no shortness of breath, no palpitations, no vomiting, no cough/congestion, no sore throat, no paresthesias.  ED Course: Temperature 97.2, HR 79, RR 17, BP 153/85, SPO2 100% on room air.  WBC 8.3, hemoglobin 13.2, platelets 280, sodium 139, potassium 3.4, BUN 8, creatinine 0.89, glucose 91.  Total bilirubin 0.5, AST 22, ALT 18.  Urinalysis unrevealing.  Salicylate level less than 7.0.  Acetaminophen level less than  10.  COVID-19 test negative.  Lipase 24.  KUB with scattered air-fluid levels with borderline dilated small bowel consistent with partial small bowel obstruction versus ileus versus enteritis.  Case was discussed by ED physician with gastroenterology, [name] who recommended medicine admission for further work-up and GI will see in consultation.    Review of Systems: As per HPI otherwise 10 point review of systems negative.   Past Medical History:  Diagnosis Date  . Allergy   . Anal fistula   . Anxiety   . 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 Autoimmune hepatitis ([place]) 01/19/2013  . Avoidant-restrictive food intake disorder (ARFID) ? 06/15/2018  . Cancer of skin of neck   . Cataract   . Chronic mesenteric ischemia ([place])   . Chronic pain syndrome 07/09/2016  . Crohn's disease of small and large intestines ([place])    followed by dr Glendell Docker gessner  . Dairy product intolerance   . Diarrhea, functional   . Family history of adverse reaction to anesthesia   . Grover's disease    transient acantholytic dermatosis  . History of alcohol abuse   . History of basal cell carcinoma excision    2013 left leg  . History of Clostridium difficile    10/ 2014  . History of multiple concussions    x6   last one Jan 2017 per pt--  no residual  . History of substance abuse ([place])    quit 1997 per pt  . History of suicide attempt    05-18-2012  overdose /  failure to thrive  .  Hypercholesterolemia   . Iron deficiency anemia due to chronic blood loss   . Major depression, recurrent, chronic ([place])   . Osteopenia   . Personal history of adenomatous colonic polyps 12/2010, 03/2012   12/2010 - 8 mm serrated adenoma of rectum  . Portal vein thrombosis 03/21/2015   right  . Primary sclerosing cholangitis    ? hepatitis overlap - liver bx x 2 and MRCP  . Seasonal allergies   . Substance abuse ([place]) 1997   Alcohol  . Vitamin A deficiency 05/23/2018  . Vitamin C deficiency 05/23/2018    Past  Surgical History:  Procedure Laterality Date  . ABDOMINAL AORTAGRAM N/A 03/29/2012   Procedure: ABDOMINAL Maxcine Ham;  Surgeon: Serafina Mitchell, MD;  Location: [facility] CATH LAB;  Service: Cardiovascular;  Laterality: N/A;  . ADENOIDECTOMY  age 89  . BIOPSY  05/31/2018   Procedure: BIOPSY;  Surgeon: Milus Banister, MD;  Location: [facility] ENDOSCOPY;  Service: Endoscopy;;  . CATARACT EXTRACTION W/ INTRAOCULAR LENS  IMPLANT, BILATERAL  2009  . COLONOSCOPY  2001, 05/02/2003, 01/28/11   2012: Right colon Crohn's, rectal polyp  . COLONOSCOPY  03/31/2012   Procedure: COLONOSCOPY;  Surgeon: Jerene Bears, MD;  Location: [facility];  Service: Gastroenterology;  Laterality: N/A;  . COLONOSCOPY WITH PROPOFOL N/A 05/31/2018   Procedure: COLONOSCOPY WITH PROPOFOL;  Surgeon: Milus Banister, MD;  Location: [facility] ENDOSCOPY;  Service: Endoscopy;  Laterality: N/A;  . ESOPHAGOGASTRODUODENOSCOPY  01/28/11   Normal  . FOOT SURGERY Right age 63  . MOHS SURGERY Left 11/2013   left ankle parakerotosis   . PERCUTANEOUS LIVER BIOPSY  2007 and 2008  . PILONIDAL CYST EXCISION  age 67  . PLACEMENT OF SETON N/A 11/06/2015   Procedure: PLACEMENT OF SETON;  Surgeon: [name], MD;  Location: [facility];  Service: General;  Laterality: N/A;  . UPPER GASTROINTESTINAL ENDOSCOPY       reports that he quit smoking about 21 years ago. His smoking use included cigarettes. He has a 15.00 pack-year smoking history. He has never used smokeless tobacco. He reports that he does not drink alcohol or use drugs.  Allergies  Allergen Reactions  . Asacol [Mesalamine] Diarrhea    abd pain  . Flagyl [Metronidazole] Diarrhea    abd pain  . Azathioprine Diarrhea    abd pain  . Gluten Meal Diarrhea  . Mycophenolate Mofetil Diarrhea    abd pain  . Other     NUTS; constipation  . Wheat Bran Diarrhea    Constipation; flatulence; abd pain  . Tape Itching and Rash    Please use "paper" tape    Family History  Problem Relation  Age of Onset  . Heart disease Father   . Thyroid cancer Father   . Allergies Father   . Clotting disorder Father   . Breast cancer Mother   . Stomach cancer Mother   . Colon cancer Neg Hx   . Esophageal cancer Neg Hx   . Rectal cancer Neg Hx      Prior to Admission medications   Medication Sig Start Date End Date Taking? Authorizing Provider  AMBULATORY NON FORMULARY MEDICATION Dilitazem 2% cream  Apply pea size amount to rectum twice daily Patient not taking: Reported on 07/06/2018 05/02/18   [name], MD  ascorbic acid (VITAMIN C) 1000 MG tablet Take 2,000 mg by mouth daily.     [provider]  Calcium Carb-Cholecalciferol (CALCIUM 1000 + D) 1000-800 MG-UNIT TABS  Take by mouth. Takes 2 (2045m) tabs bid    [provider]  cholecalciferol (VITAMIN D) 1000 UNITS tablet Take 4,000 Units by mouth 2 (two) times daily.     [provider]  clonazePAM (KLONOPIN) 0.5 MG tablet Take 1 tablet (0.5 mg total) by mouth at bedtime as needed (insomnia). 11/28/14   GGatha Mayer MD  dicyclomine (BENTYL) 20 MG tablet Take 1 tablet (20 mg total) by mouth 4 (four) times daily -  before meals and at bedtime. 07/10/18   GGatha Mayer MD  HYDROcodone-acetaminophen (NORCO/VICODIN) 5-325 MG tablet Take 1-2 tablets by mouth every 6 (six) hours as needed for moderate pain or severe pain. 06/15/18   GGatha Mayer MD  loperamide (IMODIUM) 2 MG capsule Take 1 capsule (2 mg total) by mouth 2 (two) times daily. Patient not taking: Reported on 07/06/2018 06/01/18   AReyne Dumas MD  mirtazapine (REMERON) 30 MG tablet Take 30 mg by mouth at bedtime.    [provider]  ondansetron (ZOFRAN) 4 MG tablet Take 1 tablet (4 mg total) by mouth every 6 (six) hours as needed for nausea. Patient not taking: Reported on 07/06/2018 06/01/18   AReyne Dumas MD  predniSONE (DELTASONE) 20 MG tablet Take 10 mg by mouth daily with breakfast.    [provider]  triamcinolone cream  (KENALOG) 0.1 % Apply 1-2 application topically daily as needed for other. 02/13/18   [provider]  Vedolizumab (ENTYVIO IV) Inject into the vein. Every 2 months    [provider]    Physical Exam: Vitals:   07/19/18 1338 07/19/18 1340 07/19/18 1417 07/19/18 1500  BP: (!) 128/93  (!) 164/97 (!) 153/85  Pulse: (!) 103  (!) 101 79  Resp: 14  12 17   Temp: (!) 97.2 F (36.2 C)     TempSrc: Oral     SpO2: 99%  100% 100%  Weight:  70.3 kg    Height:  6' 2"  (1.88 m)      Constitutional: NAD, calm, comfortable Vitals:   07/19/18 1338 07/19/18 1340 07/19/18 1417 07/19/18 1500  BP: (!) 128/93  (!) 164/97 (!) 153/85  Pulse: (!) 103  (!) 101 79  Resp: 14  12 17   Temp: (!) 97.2 F (36.2 C)     TempSrc: Oral     SpO2: 99%  100% 100%  Weight:  70.3 kg    Height:  6' 2"  (1.88 m)     Eyes: PERRL, lids and conjunctivae normal ENMT: Mucous membranes are dry. Posterior pharynx clear of any exudate or lesions.Normal dentition.  Neck: normal, supple, no masses, no thyromegaly Respiratory: clear to auscultation bilaterally, no wheezing, no crackles. Normal respiratory effort. No accessory muscle use.  Cardiovascular: Regular rate and rhythm, no murmurs / rubs / gallops. No extremity edema. 2+ pedal pulses. No carotid bruits.  Abdomen: Mild to moderate generalized abdominal tenderness, worse in bilateral lower quadrants, no rebound/guarding, no masses palpated. No hepatosplenomegaly.  Absent to distant bowel sounds. Musculoskeletal: no clubbing / cyanosis. No joint deformity upper and lower extremities. Good ROM, no contractures. Normal muscle tone.  Skin: no rashes, lesions, ulcers. No induration Neurologic: CN 2-12 grossly intact. Sensation intact, DTR normal. Strength 5/5 in all 4.  Psychiatric: Depressed, withdrawn  Labs on Admission: I have personally reviewed following labs and imaging studies  CBC: Recent Labs  Lab 07/19/18 1358  WBC 8.3  HGB 13.2  HCT 41.8   MCV 88.4  PLT 280  Basic Metabolic Panel: Recent Labs  Lab 07/19/18 1358  NA 139  K 3.4*  CL 99  CO2 31  GLUCOSE 91  BUN 8  CREATININE 0.89  CALCIUM 8.3*   GFR: Estimated Creatinine Clearance: 93.3 mL/min (by C-G formula based on SCr of 0.89 mg/dL). Liver Function Tests: Recent Labs  Lab 07/19/18 1358  AST 22  ALT 18  ALKPHOS 293*  BILITOT 0.5  PROT 6.0*  ALBUMIN 2.9*   Recent Labs  Lab 07/19/18 1358  LIPASE 24   No results for input(s): AMMONIA in the last 168 hours. Coagulation Profile: No results for input(s): INR, PROTIME in the last 168 hours. Cardiac Enzymes: No results for input(s): CKTOTAL, CKMB, CKMBINDEX, TROPONINI in the last 168 hours. BNP (last 3 results) No results for input(s): PROBNP in the last 8760 hours. HbA1C: No results for input(s): HGBA1C in the last 72 hours. CBG: No results for input(s): GLUCAP in the last 168 hours. Lipid Profile: No results for input(s): CHOL, HDL, LDLCALC, TRIG, CHOLHDL, LDLDIRECT in the last 72 hours. Thyroid Function Tests: No results for input(s): TSH, T4TOTAL, FREET4, T3FREE, THYROIDAB in the last 72 hours. Anemia Panel: No results for input(s): VITAMINB12, FOLATE, FERRITIN, TIBC, IRON, RETICCTPCT in the last 72 hours. Urine analysis:    Component Value Date/Time   COLORURINE YELLOW 07/19/2018 1414   APPEARANCEUR CLEAR 07/19/2018 1414   LABSPEC 1.005 07/19/2018 1414   PHURINE 8.0 07/19/2018 1414   GLUCOSEU NEGATIVE 07/19/2018 1414   HGBUR NEGATIVE 07/19/2018 1414   BILIRUBINUR NEGATIVE 07/19/2018 1414   [place] 07/19/2018 1414   PROTEINUR NEGATIVE 07/19/2018 1414   UROBILINOGEN 0.2 03/26/2012 0123   NITRITE NEGATIVE 07/19/2018 1414   LEUKOCYTESUR NEGATIVE 07/19/2018 1414    Radiological Exams on Admission: Dg Abdomen Acute W/chest  Result Date: 07/19/2018 CLINICAL DATA:  Abdominal pain with nausea and diarrhea. History of Crohn's disease EXAM: DG ABDOMEN ACUTE W/ 1V CHEST COMPARISON:   Chest radiograph September 05, 2015; CT abdomen and pelvis April 26, 2018 FINDINGS: PA chest: No edema or consolidation. Heart size and pulmonary vascularity are normal. No adenopathy. Supine and upright abdomen: There are several loops of borderline dilated small bowel with scattered air-fluid levels. No evident free air. There is air in the colon and rectum. There is a probable phlebolith in the lower left pelvis. IMPRESSION: Scattered air-fluid levels with several loops of borderline dilated small bowel. Suspect a degree of enteritis or ileus. The earliest changes of partial small-bowel obstruction cannot be excluded. Note that there is air in the colon and rectum. No free air. Lungs clear. Electronically Signed   By: Lowella Grip III M.D.   On: 07/19/2018 15:40    Assessment/Plan Principal Problem:   Partial small bowel obstruction ([name]) Active Problems:   CROHN'S DISEASE, LARGE AND SMALL INTESTINES   Long term current use of systemic steroids   Major depressive disorder, recurrent episode, severe ([name])   Long-term use of immunosuppressant medication - Entyvio   Loss of weight   Abdominal pain   Abdominal pain Partial small bowel obstruction Patient presenting with progressive abdominal discomfort with associated nausea and diarrhea with decreased oral intake over the past 2 weeks.  History of Crohn's disease, currently on outpatient therapy with daily prednisone and vendolizumab.  Patient was instructed by his GI physician to present to the ED due to his concerning symptoms.  KUB concerning for scattered air-fluid levels with borderline dilated small bowel consistent with enteritis versus ileus versus partial small bowel obstruction.  Patient initially refused CT abdomen/pelvis for further evaluation but now verbally consents. --Admit to inpatient med/surg --CT abdomen/pelvis with IV/oral contrast for further evaluation of his underlying bowel obstruction versus Crohn's exacerbation  --Check ESR and CRP --N.p.o. --Place NG tube to low wall suction --morphine IV prn for pain --Patient request palliative care consultation --We will await further GI recommendations --If CT abdomen/pelvis notable for small bowel obstruction, may need involvement of general surgery; but patient will likely deny any further escalation of care at this point based on conversations tonight.  Depression Patient with progressive worsening abdominal discomfort.  GI concern for exacerbation of his underlying depression versus actual flare of his Crohn's disease.  --Continue home clonazepam as needed --We will consult psychiatry for evaluation and further recommendations   DVT prophylaxis: Heparin Code Status: DNR Family Communication: none Disposition Plan: Home when medically ready Consults called: [facility] by EDP, psychiatry Admission status: inpatient     Eric J [place] DO [facility] Pager [phone]  If 7PM-7AM, please contact night-coverage [email or web address] Password [facility]  07/19/2018, 4:54 PM

## 2018-07-19 NOTE — ED Notes (Signed)
Patient was requesting pallative care from physician while RN was in the room.

## 2018-07-19 NOTE — ED Notes (Signed)
I am asking if Santiago Glad, MUS can page the admitting physician to inquire about the sitter order.

## 2018-07-19 NOTE — Progress Notes (Signed)
   [name] is a longstanding patient of mine with Crohn's disease of the colon and small bowel, autoimmune hepatitis with primary sclerosing cholangitis overlap and depression.  Over the past couple of months or more he has had a progressive decline in what I think is worsening depression.  He has not been suicidal though he has been years ago.  A number of years ago he was quite ill and went to hospice because he did not want to have health care and treatments anymore.  He spontaneously improved and decided to return to care.  He has been through something similar lately, and has not been taking most if any of his medications though I think he is taking his prednisone.  He has been complaining of severe abdominal pain at night with what sounds like borborygmi.  I have not been able to evaluate him and in person in some time.  He is not eating well.  He sort of has a failure to thrive situation in context of chronic medical problems and untreated depression which had responded to Remeron and SSRI in the past.  He is a very nice fellow who is struggling to deal with his chronic medical and mental illnesses.  We were going to have him do an outpatient palliative care consult to establish goals of care but he sent a message saying he really could not bear the pain he was having anymore.   I think he would benefit from at least an observation if not inpatient admission depending upon what criteria he fits, I palliative care consult with goals of care, and he could need additional cross-sectional imaging if his abdominal exam suggested that though what I think might be beneficial (depending upon physical exam and history taken in person) would be an EGD versus empiric treatment for ulcer disease.  He might not let us do that.  I have let [name] Savoy, NP know that he is in the emergency department and when he comes into the hospital we will see him in consultation to help coordinate things.  Again he  may not choose to undergo testing.  If he would except psychiatric evaluation and treatment I think that would help as well.  Thanks in advance for everyone's help with this patient. As I know him about as well as anybody does regarding his medical care please do not hesitate to contact me if I can provide further help.   [name], MD, [facility] Cell [phone] Pager [phone]

## 2018-07-19 NOTE — ED Notes (Signed)
Patient returned from X-ray 

## 2018-07-19 NOTE — ED Notes (Signed)
Bedside commode placed in room. Call bell within reach. Patient encouraged to call if he needs assistance transitioning to [facility].

## 2018-07-19 NOTE — ED Provider Notes (Signed)
[facility] DEPT Provider Note   CSN: [id number] Arrival date & time: 07/19/18  1324    History   Chief Complaint Chief Complaint  Patient presents with  . Abdominal Pain  . Nausea  . Diarrhea    HPI [name] is a 53 y.o. male.     53 year old male with prior medical history as detailed below presents for evaluation of abdominal pain, nausea, and diarrhea.  Patient reports longstanding history of chronic symptoms secondary to Crohn's and auto-immune hepatitis.  He is well-known to [name] with [facility].  Patient with recent worsening symptoms - nausea, abdominal pain, and diarrhea.  Per [name] patient has had to deal with longstanding depression.  Patient is reportedly intermittently taking his medications at home. [name] feels that patient would benefit from inpatient admission, IVF, TTS, and Palliative care consults.     The history is provided by the patient and medical records.  Illness  Location:  Nausea, diarrhea, abdominal pain Severity:  Moderate Onset quality:  Unable to specify Duration:  3 weeks Timing:  Constant Progression:  Waxing and waning Chronicity:  New Associated symptoms: abdominal pain and nausea   Associated symptoms: no fever and no vomiting     Past Medical History:  Diagnosis Date  . Allergy   . Anal fistula   . Anxiety   . 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 Autoimmune hepatitis ([place]) 01/19/2013  . Avoidant-restrictive food intake disorder (ARFID) ? 06/15/2018  . Cancer of skin of neck   . Cataract   . Chronic mesenteric ischemia ([place])   . Chronic pain syndrome 07/09/2016  . Crohn's disease of small and large intestines ([place])    followed by dr Glendell Docker gessner  . Dairy product intolerance   . Diarrhea, functional   . Family history of adverse reaction to anesthesia   . Grover's disease    transient acantholytic dermatosis  . History of alcohol abuse   . History of basal  cell carcinoma excision    2013 left leg  . History of Clostridium difficile    10/ 2014  . History of multiple concussions    x6   last one Jan 2017 per pt--  no residual  . History of substance abuse ([place])    quit 1997 per pt  . History of suicide attempt    05-18-2012  overdose /  failure to thrive  . Hypercholesterolemia   . Iron deficiency anemia due to chronic blood loss   . Major depression, recurrent, chronic ([place])   . Osteopenia   . Personal history of adenomatous colonic polyps 12/2010, 03/2012   12/2010 - 8 mm serrated adenoma of rectum  . Portal vein thrombosis 03/21/2015   right  . Primary sclerosing cholangitis    ? hepatitis overlap - liver bx x 2 and MRCP  . Seasonal allergies   . Substance abuse ([place]) 1997   Alcohol  . Vitamin A deficiency 05/23/2018  . Vitamin C deficiency 05/23/2018    Patient Active Problem List   Diagnosis Date Noted  . Avoidant-restrictive food intake disorder (ARFID) ? 06/15/2018  . Dilated intrahepatic bile ducts   . Vitamin C deficiency 05/23/2018  . Vitamin A deficiency 05/23/2018  . Loss of weight 04/07/2018  . Grover's disease 11/04/2017  . Chronic pain syndrome 07/09/2016  . Perirectal fistula 05/18/2016  . Portal vein thrombosis 03/28/2015  . Insomnia 11/29/2014  . Long-term use of immunosuppressant medication - Entyvio  11/28/2014  . Posterior Anal fissure 10/03/2014  . Anemia due to chronic blood loss 02/11/2014  . 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 Rash on trunk 12/31/2012  . Arthralgia 08/28/2012  . Major depressive disorder, recurrent episode, severe ([place]) 04/12/2012  . Personal history of adenomatous colonic polyps 01/18/2012  . Osteopenia 02/17/2011  . Long term current use of systemic steroids 08/11/2010  . Essential hypertension 08/09/2008  . VITAMIN D DEFICIENCY 09/26/2007  . HYPERLIPIDEMIA, SEVERE 09/26/2007  . CROHN'S DISEASE, LARGE AND SMALL INTESTINES 03/28/2007    Past Surgical History:   Procedure Laterality Date  . ABDOMINAL AORTAGRAM N/A 03/29/2012   Procedure: ABDOMINAL Maxcine Ham;  Surgeon: Serafina Mitchell, MD;  Location: [facility] CATH LAB;  Service: Cardiovascular;  Laterality: N/A;  . ADENOIDECTOMY  age 41  . BIOPSY  05/31/2018   Procedure: BIOPSY;  Surgeon: Milus Banister, MD;  Location: [facility] ENDOSCOPY;  Service: Endoscopy;;  . CATARACT EXTRACTION W/ INTRAOCULAR LENS  IMPLANT, BILATERAL  2009  . COLONOSCOPY  2001, 05/02/2003, 01/28/11   2012: Right colon Crohn's, rectal polyp  . COLONOSCOPY  03/31/2012   Procedure: COLONOSCOPY;  Surgeon: Jerene Bears, MD;  Location: [facility];  Service: Gastroenterology;  Laterality: N/A;  . COLONOSCOPY WITH PROPOFOL N/A 05/31/2018   Procedure: COLONOSCOPY WITH PROPOFOL;  Surgeon: Milus Banister, MD;  Location: [facility] ENDOSCOPY;  Service: Endoscopy;  Laterality: N/A;  . ESOPHAGOGASTRODUODENOSCOPY  01/28/11   Normal  . FOOT SURGERY Right age 39  . MOHS SURGERY Left 11/2013   left ankle parakerotosis   . PERCUTANEOUS LIVER BIOPSY  2007 and 2008  . PILONIDAL CYST EXCISION  age 3  . PLACEMENT OF SETON N/A 11/06/2015   Procedure: PLACEMENT OF SETON;  Surgeon: [name], MD;  Location: [facility];  Service: General;  Laterality: N/A;  . UPPER GASTROINTESTINAL ENDOSCOPY          Home Medications    Prior to Admission medications   Medication Sig Start Date End Date Taking? Authorizing Provider  AMBULATORY NON FORMULARY MEDICATION Dilitazem 2% cream  Apply pea size amount to rectum twice daily Patient not taking: Reported on 07/06/2018 05/02/18   [name], MD  ascorbic acid (VITAMIN C) 1000 MG tablet Take 2,000 mg by mouth daily.     [provider]  Calcium Carb-Cholecalciferol (CALCIUM 1000 + D) 1000-800 MG-UNIT TABS Take by mouth. Takes 2 (2030m) tabs bid    [provider]  cholecalciferol (VITAMIN D) 1000 UNITS tablet Take 4,000 Units by mouth 2 (two) times daily.     [provider]   clonazePAM (KLONOPIN) 0.5 MG tablet Take 1 tablet (0.5 mg total) by mouth at bedtime as needed (insomnia). 11/28/14   GGatha Mayer MD  dicyclomine (BENTYL) 20 MG tablet Take 1 tablet (20 mg total) by mouth 4 (four) times daily -  before meals and at bedtime. 07/10/18   GGatha Mayer MD  HYDROcodone-acetaminophen (NORCO/VICODIN) 5-325 MG tablet Take 1-2 tablets by mouth every 6 (six) hours as needed for moderate pain or severe pain. 06/15/18   GGatha Mayer MD  loperamide (IMODIUM) 2 MG capsule Take 1 capsule (2 mg total) by mouth 2 (two) times daily. Patient not taking: Reported on 07/06/2018 06/01/18   AReyne Dumas MD  mirtazapine (REMERON) 30 MG tablet Take 30 mg by mouth at bedtime.    [provider]  ondansetron (ZOFRAN) 4 MG tablet Take 1 tablet (4 mg total) by mouth every 6 (six) hours  as needed for nausea. Patient not taking: Reported on 07/06/2018 06/01/18   Reyne Dumas, MD  predniSONE (DELTASONE) 20 MG tablet Take 10 mg by mouth daily with breakfast.    [provider]  triamcinolone cream (KENALOG) 0.1 % Apply 1-2 application topically daily as needed for other. 02/13/18   [provider]  Vedolizumab (ENTYVIO IV) Inject into the vein. Every 2 months    [provider]    Family History Family History  Problem Relation Age of Onset  . Heart disease Father   . Thyroid cancer Father   . Allergies Father   . Clotting disorder Father   . Breast cancer Mother   . Stomach cancer Mother   . Colon cancer Neg Hx   . Esophageal cancer Neg Hx   . Rectal cancer Neg Hx     Social History Social History   Tobacco Use  . Smoking status: Former Smoker    Packs/day: 1.00    Years: 15.00    Pack years: 15.00    Types: Cigarettes    Last attempt to quit: 03/28/1997    Years since quitting: 21.3  . Smokeless tobacco: Never Used  Substance Use Topics  . Alcohol use: No    Alcohol/week: 0.0 standard drinks  . Drug use: No    Comment: QUIT USING  DRUGS IN 1997     Allergies   Asacol [mesalamine]; Flagyl [metronidazole]; Azathioprine; Gluten meal; Mycophenolate mofetil; Other; Wheat bran; and Tape   Review of Systems Review of Systems  Constitutional: Negative for fever.  Gastrointestinal: Positive for abdominal pain and nausea. Negative for vomiting.  All other systems reviewed and are negative.    Physical Exam Updated Vital Signs BP (!) 164/97   Pulse (!) 101   Temp (!) 97.2 F (36.2 C) (Oral)   Resp 12   Ht 6' 2"  (1.88 m)   Wt 70.3 kg   SpO2 100%   BMI 19.90 kg/m   Physical Exam Vitals signs and nursing note reviewed.  Constitutional:      General: He is not in acute distress.    Appearance: He is ill-appearing.  HENT:     Head: Normocephalic and atraumatic.  Eyes:     Conjunctiva/sclera: Conjunctivae normal.     Pupils: Pupils are equal, round, and reactive to light.  Neck:     Musculoskeletal: Normal range of motion and neck supple.  Cardiovascular:     Rate and Rhythm: Normal rate and regular rhythm.     Heart sounds: Normal heart sounds.  Pulmonary:     Effort: Pulmonary effort is normal. No respiratory distress.     Breath sounds: Normal breath sounds.  Abdominal:     General: There is no distension.     Palpations: Abdomen is soft.     Tenderness: There is abdominal tenderness.     Comments: Mild diffuse tenderness with palpation   No rebound   No guarding   Musculoskeletal: Normal range of motion.        General: No deformity.  Skin:    General: Skin is warm and dry.  Neurological:     Mental Status: He is alert and oriented to person, place, and time.      ED Treatments / Results  Labs (all labs ordered are listed, but only abnormal results are displayed) Labs Reviewed  SARS CORONAVIRUS 2 (HOSPITAL ORDER, [facility])  CBC  LIPASE, BLOOD  COMPREHENSIVE METABOLIC PANEL  URINALYSIS, ROUTINE W  REFLEX MICROSCOPIC  ACETAMINOPHEN LEVEL  SALICYLATE LEVEL   ETHANOL    EKG None  Radiology No results found.  Procedures Procedures (including critical care time)  Medications Ordered in ED Medications  ondansetron (ZOFRAN) injection 4 mg (4 mg Intravenous Given 07/19/18 1407)  sodium chloride 0.9 % bolus 1,000 mL (1,000 mLs Intravenous New Bag/Given 07/19/18 1406)     Initial Impression / Assessment and Plan / ED Course  I have reviewed the triage vital signs and the nursing notes.  Pertinent labs & imaging results that were available during my care of the patient were reviewed by me and considered in my medical decision making (see chart for details).        MDM  Screen complete  CAMARION WEIER was evaluated in Emergency Department on 07/19/2018 for the symptoms described in the history of present illness. He was evaluated in the context of the global COVID-19 pandemic, which necessitated consideration that the patient might be at risk for infection with the SARS-CoV-2 virus that causes COVID-19. Institutional protocols and algorithms that pertain to the evaluation of patients at risk for COVID-19 are in a state of rapid change based on information released by regulatory bodies including the CDC and federal and state organizations. These policies and algorithms were followed during the patient's care in the ED.  Patient is presenting for evaluation of abdominal pain, nausea, and diarrhea.  Patient's symptoms are most likely secondary to his Crohn's disease.  This appears to be a chronic issue.  However, patient was sent to the ED for likely admission by his GI doctor - [name]. Per my discussion with [name], patient with increasing GI symptoms and increased concurrent depressive symptoms. Patient likely would benefit from palliative care consult and overnight observation.   Patient denies active SI at this time. Given his apparent depression, will still consult TTS.   Screening labs and imaging ordered.   Dr. Darl Householder aware  of pending studies and disposition.   Final Clinical Impressions(s) / ED Diagnoses   Final diagnoses:  Diarrhea, unspecified type  Nausea  Abdominal pain, unspecified abdominal location    ED Discharge Orders    None       Valarie Merino, MD 07/19/18 1446

## 2018-07-19 NOTE — ED Triage Notes (Signed)
Patient reports that he he had abdominal pain, nausea, and diarrhea x 2 1/2 weeks. Patient states he was admitted a month ago for the same.  Patient states that if he has to be admitted he wants to go to Pallative care.

## 2018-07-19 NOTE — ED Notes (Signed)
Patient transported to X-ray 

## 2018-07-19 NOTE — ED Notes (Signed)
Patient reported to this RN and Extern that he did not want a CT scan. Pt reports he wants an Endoscopy.  RN made MD aware.

## 2018-07-19 NOTE — Telephone Encounter (Signed)
-----   Message from [name], MD sent at 07/19/2018 10:04 AM EDT ----- Regarding: FW: help please Please contact [facility] for a palliative care goals of care consult for [name]  Thanks ----- Message ----- From: Knox Royalty, NP Sent: 07/18/2018   4:56 PM EDT To: Acquanetta Chain, DO, [name], MD Subject: RE: help please                                I certainly do remember [name]!  Unfortunately with the virus,  even community based palliative services are limited.  [facility] is doing virtual visits.  That referral number is [phone].  I worry that his underlying psychiatric issues may be being impacted by social isolation and changes he is experiencing from effects of COVID.    I guess an admission to the hospital is always a possibility and I would be happy to see him if that is the decision, just not sure what viable interventions are there for him :(  Stanton Kidney    ----- Message ----- From: [name], MD Sent: 07/18/2018   1:38 PM EDT To: Knox Royalty, NP, Acquanetta Chain, DO Subject: help please                                    I know Stanton Kidney remembers this man.  He has multiple medical problems and seems to have given up again.  Asking about going into hospice which does not seem legitimate based upon what I know and life expectancy.  He is pretty depressed and not taking his meds   Is there anyway to get an outpatient palliative care consult for him, establish goals of care  He is in that place where sort of could be admitted but I can't seem to get him there yet. Maybe I just need to have him head to hospital if he will, and get admitted   Thanks  Glendell Docker

## 2018-07-19 NOTE — Progress Notes (Addendum)
  While performing my nursing admission assessment, the Pt made a statement to the writer three times, that he wanted to die, and would like a  Palliative care consult. States " I can't live like this any more". I made the [facility] and the Charge Nurse Vera aware, a SI sitter is at the bedside at this time. I will continue to monitor.

## 2018-07-19 NOTE — Telephone Encounter (Signed)
I spoke with [facility] they will initiate the consult.  She has access to all records via Epic.

## 2018-07-19 NOTE — ED Notes (Signed)
ED TO INPATIENT HANDOFF REPORT  ED Nurse Name and Phone #: [phone]  S Name/Age/Gender [name] 53 y.o. male Room/Bed: WA14/WA14  Code Status   Code Status: Prior  Home/SNF/Other Home Patient oriented to: self, place, time and situation Is this baseline? Yes   Triage Complete: Triage complete  Chief Complaint abd pain  Triage Note Patient reports that he he had abdominal pain, nausea, and diarrhea x 2 1/2 weeks. Patient states he was admitted a month ago for the same.  Patient states that if he has to be admitted he wants to go to Pallative care.   Allergies Allergies  Allergen Reactions  . Asacol [Mesalamine] Diarrhea    abd pain  . Flagyl [Metronidazole] Diarrhea    abd pain  . Azathioprine Diarrhea    abd pain  . Gluten Meal Diarrhea  . Mycophenolate Mofetil Diarrhea    abd pain  . Other     NUTS; constipation  . Wheat Bran Diarrhea    Constipation; flatulence; abd pain  . Tape Itching and Rash    Please use "paper" tape    Level of Care/Admitting Diagnosis ED Disposition    ED Disposition Condition Comment   Admit  Hospital Area: [facility] [[id number]]  Level of Care: Med-Surg [16]  Covid Evaluation: N/A  Diagnosis: Partial small bowel obstruction [facility]) [[id number]]  Admitting Physician: [place] ERIC J [[id number]]  Attending Physician: [place] ERIC J [[id number]]  Estimated length of stay: past midnight tomorrow  Certification:: I certify this patient will need inpatient services for at least 2 midnights  PT Class (Do Not Modify): Inpatient [101]  PT Acc Code (Do Not Modify): Private [1]       B Medical/Surgery History Past Medical History:  Diagnosis Date  . Allergy   . Anal fistula   . Anxiety   . 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 Autoimmune hepatitis ([place]) 01/19/2013  . Avoidant-restrictive food intake disorder (ARFID) ? 06/15/2018  . Cancer of skin of neck   . Cataract   . Chronic mesenteric ischemia  ([place])   . Chronic pain syndrome 07/09/2016  . Crohn's disease of small and large intestines ([place])    followed by dr Glendell Docker gessner  . Dairy product intolerance   . Diarrhea, functional   . Family history of adverse reaction to anesthesia   . Grover's disease    transient acantholytic dermatosis  . History of alcohol abuse   . History of basal cell carcinoma excision    2013 left leg  . History of Clostridium difficile    10/ 2014  . History of multiple concussions    x6   last one Jan 2017 per pt--  no residual  . History of substance abuse ([place])    quit 1997 per pt  . History of suicide attempt    05-18-2012  overdose /  failure to thrive  . Hypercholesterolemia   . Iron deficiency anemia due to chronic blood loss   . Major depression, recurrent, chronic ([place])   . Osteopenia   . Personal history of adenomatous colonic polyps 12/2010, 03/2012   12/2010 - 8 mm serrated adenoma of rectum  . Portal vein thrombosis 03/21/2015   right  . Primary sclerosing cholangitis    ? hepatitis overlap - liver bx x 2 and MRCP  . Seasonal allergies   . Substance abuse ([place]) 1997   Alcohol  . Vitamin A deficiency 05/23/2018  . Vitamin C deficiency 05/23/2018  Past Surgical History:  Procedure Laterality Date  . ABDOMINAL AORTAGRAM N/A 03/29/2012   Procedure: ABDOMINAL Maxcine Ham;  Surgeon: Serafina Mitchell, MD;  Location: [facility] CATH LAB;  Service: Cardiovascular;  Laterality: N/A;  . ADENOIDECTOMY  age 24  . BIOPSY  05/31/2018   Procedure: BIOPSY;  Surgeon: Milus Banister, MD;  Location: [facility];  Service: Endoscopy;;  . CATARACT EXTRACTION W/ INTRAOCULAR LENS  IMPLANT, BILATERAL  2009  . COLONOSCOPY  2001, 05/02/2003, 01/28/11   2012: Right colon Crohn's, rectal polyp  . COLONOSCOPY  03/31/2012   Procedure: COLONOSCOPY;  Surgeon: Jerene Bears, MD;  Location: [facility];  Service: Gastroenterology;  Laterality: N/A;  . COLONOSCOPY WITH PROPOFOL N/A 05/31/2018   Procedure: COLONOSCOPY WITH  PROPOFOL;  Surgeon: Milus Banister, MD;  Location: [facility] ENDOSCOPY;  Service: Endoscopy;  Laterality: N/A;  . ESOPHAGOGASTRODUODENOSCOPY  01/28/11   Normal  . FOOT SURGERY Right age 41  . MOHS SURGERY Left 11/2013   left ankle parakerotosis   . PERCUTANEOUS LIVER BIOPSY  2007 and 2008  . PILONIDAL CYST EXCISION  age 66  . PLACEMENT OF SETON N/A 11/06/2015   Procedure: PLACEMENT OF SETON;  Surgeon: [name], MD;  Location: [facility];  Service: General;  Laterality: N/A;  . UPPER GASTROINTESTINAL ENDOSCOPY       A IV Location/Drains/Wounds Patient Lines/Drains/Airways Status   Active Line/Drains/Airways    Name:   Placement date:   Placement time:   Site:   Days:   Peripheral IV 07/19/18 Right Forearm   07/19/18    1426    Forearm   less than 1   Incision (Closed) 11/06/15 Rectum   11/06/15    0931     986   Wound 04/02/12 Skin tear Sacrum   04/02/12    1611    Sacrum   2299          Intake/Output Last 24 hours  Intake/Output Summary (Last 24 hours) at 07/19/2018 1832 Last data filed at 07/19/2018 1713 Gross per 24 hour  Intake 1000 ml  Output -  Net 1000 ml    Labs/Imaging Results for orders placed or performed during the hospital encounter of 07/19/18 (from the past 48 hour(s))  Lipase, blood     Status: None   Collection Time: 07/19/18  1:58 PM  Result Value Ref Range   Lipase 24 11 - 51 U/L    Comment: Performed at [facility], [place] [address] [place], [place] [address]  Comprehensive metabolic panel     Status: Abnormal   Collection Time: 07/19/18  1:58 PM  Result Value Ref Range   Sodium 139 135 - 145 mmol/L   Potassium 3.4 (L) 3.5 - 5.1 mmol/L   Chloride 99 98 - 111 mmol/L   CO2 31 22 - 32 mmol/L   Glucose, Bld 91 70 - 99 mg/dL   BUN 8 6 - 20 mg/dL   Creatinine, Ser 0.89 0.61 - 1.24 mg/dL   Calcium 8.3 (L) 8.9 - 10.3 mg/dL   Total Protein 6.0 (L) 6.5 - 8.1 g/dL   Albumin 2.9 (L) 3.5 - 5.0 g/dL   AST 22 15 - 41 U/L   ALT  18 0 - 44 U/L   Alkaline Phosphatase 293 (H) 38 - 126 U/L   Total Bilirubin 0.5 0.3 - 1.2 mg/dL   GFR calc non Af Amer >60 >60 mL/min   GFR calc Af Amer >60 >60 mL/min   Anion gap  9 5 - 15    Comment: Performed at [facility], [place] [address], [place], [place] [address]  CBC     Status: None   Collection Time: 07/19/18  1:58 PM  Result Value Ref Range   WBC 8.3 4.0 - 10.5 K/uL   RBC 4.73 4.22 - 5.81 MIL/uL   Hemoglobin 13.2 13.0 - 17.0 g/dL   HCT 41.8 39.0 - 52.0 %   MCV 88.4 80.0 - 100.0 fL   MCH 27.9 26.0 - 34.0 pg   MCHC 31.6 30.0 - 36.0 g/dL   RDW 15.0 11.5 - 15.5 %   Platelets 280 150 - 400 K/uL   nRBC 0.0 0.0 - 0.2 %    Comment: Performed at [facility], [name] [address] [place], [place] [address]  Acetaminophen level     Status: Abnormal   Collection Time: 07/19/18  1:58 PM  Result Value Ref Range   Acetaminophen (Tylenol), Serum <10 (L) 10 - 30 ug/mL    Comment: (NOTE) Therapeutic concentrations vary significantly. A range of 10-30 ug/mL  may be an effective concentration for many patients. However, some  are best treated at concentrations outside of this range. Acetaminophen concentrations >150 ug/mL at 4 hours after ingestion  and >50 ug/mL at 12 hours after ingestion are often associated with  toxic reactions. Performed at [facility], [place] [address] [place], [place] [address]   Salicylate level     Status: None   Collection Time: 07/19/18  1:58 PM  Result Value Ref Range   Salicylate Lvl <0.9 2.8 - 30.0 mg/dL    Comment: Performed at [facility], [place] [address], [place], [place] [address]  Ethanol     Status: None   Collection Time: 07/19/18  1:58 PM  Result Value Ref Range   Alcohol, Ethyl (B) <10 <10 mg/dL    Comment: (NOTE) Lowest detectable limit for serum alcohol is 10 mg/dL. For medical purposes only. Performed at [facility], [place] [address] [place], [place] [address]   Urinalysis, Routine w reflex microscopic     Status: None   Collection Time: 07/19/18  2:14 PM  Result Value Ref Range   Color, Urine YELLOW YELLOW   APPearance CLEAR CLEAR   Specific Gravity, Urine 1.005 1.005 - 1.030   pH 8.0 5.0 - 8.0   Glucose, UA NEGATIVE NEGATIVE mg/dL   Hgb urine dipstick NEGATIVE NEGATIVE   Bilirubin Urine NEGATIVE NEGATIVE   Ketones, ur NEGATIVE NEGATIVE mg/dL   Protein, ur NEGATIVE NEGATIVE mg/dL   Nitrite NEGATIVE NEGATIVE   Leukocytes,Ua NEGATIVE NEGATIVE    Comment: Performed at [facility], [place] [address], [place], [place] [address]  SARS Coronavirus 2 (CEPHEID - Performed in [place] hospital lab), Hosp Order     Status: None   Collection Time: 07/19/18  2:17 PM  Result Value Ref Range   SARS Coronavirus 2 NEGATIVE NEGATIVE    Comment: (NOTE) If result is NEGATIVE SARS-CoV-2 target nucleic acids are NOT DETECTED. The SARS-CoV-2 RNA is generally detectable in upper and lower  respiratory specimens during the acute phase of infection. The lowest  concentration of SARS-CoV-2 viral copies this assay can detect is 250  copies / mL. A negative result does not preclude SARS-CoV-2 infection  and should not be used as the sole basis for treatment or other  patient management decisions.  A negative result may occur with  improper specimen collection /  handling, submission of specimen other  than nasopharyngeal swab, presence of viral mutation(s) within the  areas targeted by this assay, and inadequate number of viral copies  (<250 copies / mL). A negative result must be combined with clinical  observations, patient history, and epidemiological information. If result is POSITIVE SARS-CoV-2 target nucleic acids are DETECTED. The SARS-CoV-2 RNA is generally detectable in upper and lower  respiratory specimens dur ing the acute phase of infection.  Positive  results are indicative of active infection with  SARS-CoV-2.  Clinical  correlation with patient history and other diagnostic information is  necessary to determine patient infection status.  Positive results do  not rule out bacterial infection or co-infection with other viruses. If result is PRESUMPTIVE POSTIVE SARS-CoV-2 nucleic acids MAY BE PRESENT.   A presumptive positive result was obtained on the submitted specimen  and confirmed on repeat testing.  While 2019 novel coronavirus  (SARS-CoV-2) nucleic acids may be present in the submitted sample  additional confirmatory testing may be necessary for epidemiological  and / or clinical management purposes  to differentiate between  SARS-CoV-2 and other Sarbecovirus currently known to infect humans.  If clinically indicated additional testing with an alternate test  methodology [phone]) is advised. The SARS-CoV-2 RNA is generally  detectable in upper and lower respiratory sp ecimens during the acute  phase of infection. The expected result is Negative. Fact Sheet for Patients:  StrictlyIdeas.no Fact Sheet for Healthcare Providers: BankingDealers.co.za This test is not yet approved or cleared by the [place] FDA and has been authorized for detection and/or diagnosis of SARS-CoV-2 by FDA under an Emergency Use Authorization (EUA).  This EUA will remain in effect (meaning this test can be used) for the duration of the COVID-19 declaration under Section 564(b)(1) of the Act, 21 U.S.C. section 360bbb-3(b)(1), unless the authorization is terminated or revoked sooner. Performed at [facility], [place] [address], [place], [place] [address]    Dg Abdomen Acute W/chest  Result Date: 07/19/2018 CLINICAL DATA:  Abdominal pain with nausea and diarrhea. History of Crohn's disease EXAM: DG ABDOMEN ACUTE W/ 1V CHEST COMPARISON:  Chest radiograph September 05, 2015; CT abdomen and pelvis April 26, 2018 FINDINGS: PA chest: No edema  or consolidation. Heart size and pulmonary vascularity are normal. No adenopathy. Supine and upright abdomen: There are several loops of borderline dilated small bowel with scattered air-fluid levels. No evident free air. There is air in the colon and rectum. There is a probable phlebolith in the lower left pelvis. IMPRESSION: Scattered air-fluid levels with several loops of borderline dilated small bowel. Suspect a degree of enteritis or ileus. The earliest changes of partial small-bowel obstruction cannot be excluded. Note that there is air in the colon and rectum. No free air. Lungs clear. Electronically Signed   By: Lowella Grip III M.D.   On: 07/19/2018 15:40    Pending Labs [employer] (From admission, onward)    Start     Ordered   Signed and Held  Sedimentation rate  Once,   R     Signed and Held   Signed and Held  C-reactive protein  Once,   R     Signed and Held   Signed and Held  CBC  (heparin)  Once,   R    Comments:  Baseline for heparin therapy IF NOT ALREADY DRAWN.  Notify MD if PLT < 100 K.    Signed and Held   Signed and Held  Creatinine,  serum  (heparin)  Once,   R    Comments:  Baseline for heparin therapy IF NOT ALREADY DRAWN.    Signed and Held   Signed and Held  Basic metabolic panel  Tomorrow morning,   R     Signed and Held   Signed and Held  CBC  Tomorrow morning,   R     Signed and Held          Vitals/Pain Today's Vitals   07/19/18 1340 07/19/18 1417 07/19/18 1500 07/19/18 1700  BP:  (!) 164/97 (!) 153/85 (!) 145/92  Pulse:  (!) 101 79 74  Resp:  12 17 18   Temp:      TempSrc:      SpO2:  100% 100% 99%  Weight: 70.3 kg     Height: 6' 2"  (1.88 m)     PainSc:        Isolation Precautions No active isolations  Medications Medications  ondansetron (ZOFRAN) injection 4 mg (4 mg Intravenous Given 07/19/18 1407)  sodium chloride 0.9 % bolus 1,000 mL (0 mLs Intravenous Stopped 07/19/18 1713)    Mobility walks Low fall risk   Focused  Assessments GI   R Recommendations: See Admitting Provider Note  Report given to:   Additional Notes: Patient is requesting Palliative care due to his hx

## 2018-07-19 NOTE — Telephone Encounter (Signed)
RE: Visit Follow-Up Question  [name], MD  Sent: Wed Jul 19, 2018 12:30 PM  To: Marlon Pel, RN      Message   I saw this after I asked for palliative consult    I would tell him to go to ED - I suspect he would at least warrant an observation admission and can get these things done    If he can tell me when he gets there or he tells ED doc to page me that will work and I will then explain to them.    ----- Message -----   Patient notified of the recommendations from [name].  He verbalized understanding.

## 2018-07-20 ENCOUNTER — Inpatient Hospital Stay (HOSPITAL_COMMUNITY): Payer: Medicare Other

## 2018-07-20 DIAGNOSIS — K50811 Crohn's disease of both small and large intestine with rectal bleeding: Secondary | ICD-10-CM

## 2018-07-20 DIAGNOSIS — R109 Unspecified abdominal pain: Secondary | ICD-10-CM

## 2018-07-20 DIAGNOSIS — Z515 Encounter for palliative care: Secondary | ICD-10-CM

## 2018-07-20 DIAGNOSIS — F332 Major depressive disorder, recurrent severe without psychotic features: Secondary | ICD-10-CM

## 2018-07-20 DIAGNOSIS — R634 Abnormal weight loss: Secondary | ICD-10-CM

## 2018-07-20 DIAGNOSIS — Z7189 Other specified counseling: Secondary | ICD-10-CM

## 2018-07-20 LAB — BASIC METABOLIC PANEL
Anion gap: 5 (ref 5–15)
BUN: 6 mg/dL (ref 6–20)
CO2: 26 mmol/L (ref 22–32)
Calcium: 7.9 mg/dL — ABNORMAL LOW (ref 8.9–10.3)
Chloride: 108 mmol/L (ref 98–111)
Creatinine, Ser: 0.89 mg/dL (ref 0.61–1.24)
GFR calc Af Amer: >60 mL/min (ref >60)
GFR calc non Af Amer: >60 mL/min (ref >60)
Glucose, Bld: 59 mg/dL — ABNORMAL LOW (ref 70–99)
Potassium: 3.6 mmol/L (ref 3.5–5.1)
Sodium: 139 mmol/L (ref 135–145)

## 2018-07-20 LAB — CBC
HCT: 36.3 % — ABNORMAL LOW (ref 39.0–52.0)
Hemoglobin: 11 g/dL — ABNORMAL LOW (ref 13.0–17.0)
MCH: 27.3 pg (ref 26.0–34.0)
MCHC: 30.3 g/dL (ref 30.0–36.0)
MCV: 90.1 fL (ref 80.0–100.0)
Platelets: 235 10*3/uL (ref 150–400)
RBC: 4.03 MIL/uL — ABNORMAL LOW (ref 4.22–5.81)
RDW: 15 % (ref 11.5–15.5)
WBC: 5.9 10*3/uL (ref 4.0–10.5)
nRBC: 0 % (ref 0.0–0.2)

## 2018-07-20 MED ORDER — IOHEXOL 300 MG/ML  SOLN
100.0000 mL | Freq: Once | INTRAMUSCULAR | Status: AC | PRN
  Administered 2018-07-20: 100 mL via INTRAVENOUS

## 2018-07-20 MED ORDER — METHYLPREDNISOLONE SODIUM SUCC 40 MG IJ SOLR: 40.0000 mg | Freq: Every day | INTRAMUSCULAR | Status: DC

## 2018-07-20 MED ORDER — METHYLPREDNISOLONE SODIUM SUCC 40 MG IJ SOLR
10.0000 mg | Freq: Every day | INTRAMUSCULAR | Status: DC
  Administered 2018-07-20 – 2018-07-21 (×2): 10 mg via INTRAVENOUS
  Filled 2018-07-20 (×2): qty 1

## 2018-07-20 MED ORDER — LORAZEPAM 2 MG/ML IJ SOLN
0.5000 mg | INTRAMUSCULAR | Status: DC | PRN
  Administered 2018-07-21: 0.5 mg via INTRAVENOUS
  Filled 2018-07-20: qty 1

## 2018-07-20 MED ORDER — LORAZEPAM 0.5 MG PO TABS
0.5000 mg | ORAL_TABLET | Freq: Four times a day (QID) | ORAL | Status: DC | PRN
  Administered 2018-07-20: 0.5 mg via ORAL
  Filled 2018-07-20: qty 1

## 2018-07-20 MED ORDER — MORPHINE SULFATE (PF) 2 MG/ML IV SOLN: 2.0000 mg | INTRAVENOUS | Status: DC | PRN

## 2018-07-20 MED ORDER — IOHEXOL 300 MG/ML  SOLN: 30.0000 mL | Freq: Once | INTRAMUSCULAR | Status: DC | PRN

## 2018-07-20 MED ORDER — DEXTROSE-NACL 5-0.9 % IV SOLN
INTRAVENOUS | Status: DC
  Administered 2018-07-20 – 2018-07-21 (×3): via INTRAVENOUS

## 2018-07-20 MED ORDER — SODIUM CHLORIDE (PF) 0.9 % IJ SOLN
INTRAMUSCULAR | Status: AC
  Filled 2018-07-20: qty 50

## 2018-07-20 NOTE — Consult Note (Signed)
Referring Provider:   [facility]        Primary Care Physician:  [name], MD Primary Gastroenterologist: Silvano Rusk, MD            Reason for Consultation:  Abdominal pain  / Crohn's disease          ASSESSMENT / PLAN:    1.  Crohn's disease of the large and small intestine.  Treatment has been complicated by severe depression / anxiety. Hehe has not been compliant with treatment in months ago.  He has self tapered prednisone down to 5 mg.  Admitted with failure to thrive/weight loss and mid lower abdominal pain, possible small bowel obstruction on plain films of the abdomen.  Weight down another 10 pounds over last 6 weeks.  -[name] has not had any nausea or vomiting.  His bowel movements are at baseline.  CTAP already ordered, he is drinking the bowel prep now.  Await CT scan results.  Continue supportive care with analgesics, anxiolytics, IV fluids. -Agree with Palliative Medicine consultation to discuss goals of care  2.  Autoimmune hepatitis with PSC overlap.  Bile duct changes and possible cirrhosis suggested on CT scan late February of this year.  No evidence for portal hypertension clinically or radiographically . New elevation in Alk phos  -Await CT scan results  3. Severe depression.  History of hospice care a few years back due to loss of interest in continuing medical care / treatments.  He improved, came out of hospice and did fairly well over the last few years.  Now desiring Palliative Care consult to discuss goals of care  HPI:   [name] is a 53 y.o. male well-known to [name] in our practice.  He has Crohn's disease of the large and small bowel, autoimmune hepatitis with PSC overlap.  His medical condition is complicated by severe depression.  Years ago he stopped medications and decided he wanted to die.  He went into hospice but condition improved and he resumed medical care.    Patient came to the ED yesterday with complaints of  worsening abdominal pain and gas.  He says this is a new pain, located in the mid lower abdomen and present for about 2 weeks.  The pain is intermittent but exacerbated by eating and so bowel movements.  He gets some relief with a heating pad.  He has chronic diarrhea, bowel movements unchanged from baseline.  Patient has tapered prednisone down to 5 mg daily.  He self tapered the medication as he thought it made the abdominal pain worse.  Additionally, he has made a correlation between the prednisone and  increased blood in stool.  Because of the pain when he eats, [name] has continued to lose weight.  At one time he was on Remeron for depression and probably to augment appetite but stopped treatment as he felt it made the abdominal pain worse/no nausea or vomiting.  In the ED yesterday his vitals were stable, he was afebrile.  . White count was normal.  Hemoglobin 11.5.  Electrolytes were normal, renal function normal.  CRP 4.0.  Alkaline phosphatase 293 (normal at baseline), albumin 2.9, AST 22, ALT 18, T bili 0.5.  Abdominal series showed scattered air-fluid levels with several loops of borderline dilated small bowel.  A degree of enteritis or ileus suspected.   MOST RECENT GI STUDIES:      Past Medical History:  Diagnosis Date  . Allergy   . Anal fistula   .  Anxiety   . 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 Autoimmune hepatitis ([place]) 01/19/2013  . Avoidant-restrictive food intake disorder (ARFID) ? 06/15/2018  . Cancer of skin of neck   . Cataract   . Chronic mesenteric ischemia ([place])   . Chronic pain syndrome 07/09/2016  . Crohn's disease of small and large intestines ([place])    followed by dr Glendell Docker gessner  . Dairy product intolerance   . Diarrhea, functional   . Family history of adverse reaction to anesthesia   . Grover's disease    transient acantholytic dermatosis  . History of alcohol abuse   . History of basal cell carcinoma excision    2013 left leg  . History of  Clostridium difficile    10/ 2014  . History of multiple concussions    x6   last one Jan 2017 per pt--  no residual  . History of substance abuse ([place])    quit 1997 per pt  . History of suicide attempt    05-18-2012  overdose /  failure to thrive  . Hypercholesterolemia   . Iron deficiency anemia due to chronic blood loss   . Major depression, recurrent, chronic ([place])   . Osteopenia   . Personal history of adenomatous colonic polyps 12/2010, 03/2012   12/2010 - 8 mm serrated adenoma of rectum  . Portal vein thrombosis 03/21/2015   right  . Primary sclerosing cholangitis    ? hepatitis overlap - liver bx x 2 and MRCP  . Seasonal allergies   . Substance abuse ([place]) 1997   Alcohol  . Vitamin A deficiency 05/23/2018  . Vitamin C deficiency 05/23/2018    Past Surgical History:  Procedure Laterality Date  . ABDOMINAL AORTAGRAM N/A 03/29/2012   Procedure: ABDOMINAL Maxcine Ham;  Surgeon: Serafina Mitchell, MD;  Location: [facility] CATH LAB;  Service: Cardiovascular;  Laterality: N/A;  . ADENOIDECTOMY  age 67  . BIOPSY  05/31/2018   Procedure: BIOPSY;  Surgeon: Milus Banister, MD;  Location: [facility];  Service: Endoscopy;;  . CATARACT EXTRACTION W/ INTRAOCULAR LENS  IMPLANT, BILATERAL  2009  . COLONOSCOPY  2001, 05/02/2003, 01/28/11   2012: Right colon Crohn's, rectal polyp  . COLONOSCOPY  03/31/2012   Procedure: COLONOSCOPY;  Surgeon: Jerene Bears, MD;  Location: [facility];  Service: Gastroenterology;  Laterality: N/A;  . COLONOSCOPY WITH PROPOFOL N/A 05/31/2018   Procedure: COLONOSCOPY WITH PROPOFOL;  Surgeon: Milus Banister, MD;  Location: [facility] ENDOS[facility];  Service: Endoscopy;  Laterality: N/A;  . ESOPHAGOGASTRODUODENOSCOPY  01/28/11   Normal  . FOOT SURGERY Right age 43  . MOHS SURGERY Left 11/2013   left ankle parakerotosis   . PERCUTANEOUS LIVER BIOPSY  2007 and 2008  . PILONIDAL CYST EXCISION  age 50  . PLACEMENT OF SETON N/A 11/06/2015   Procedure: PLACEMENT OF SETON;  Surgeon: [name], MD;  Location: [facility];  Service: General;  Laterality: N/A;  . UPPER GASTROINTESTINAL ENDOSCOPY      Prior to Admission medications   Medication Sig Start Date End Date Taking? Authorizing Provider  clonazePAM (KLONOPIN) 0.5 MG tablet Take 1 tablet (0.5 mg total) by mouth at bedtime as needed (insomnia). Patient taking differently: Take 0.25 mg by mouth 3 (three) times daily as needed for anxiety.  11/28/14  Yes [name], MD  HYDROcodone-acetaminophen (NORCO/VICODIN) 5-325 MG tablet Take 1-2 tablets by mouth every 6 (six) hours as needed for moderate pain or severe pain. Patient  taking differently: Take 0.5 tablets by mouth every 6 (six) hours as needed for moderate pain or severe pain.  06/15/18  Yes [name], MD  predniSONE (DELTASONE) 5 MG tablet Take 5 mg by mouth daily with breakfast.   Yes [provider]  AMBULATORY NON FORMULARY MEDICATION Dilitazem 2% cream  Apply pea size amount to rectum twice daily Patient not taking: Reported on 07/06/2018 05/02/18   [name], MD  dicyclomine (BENTYL) 20 MG tablet Take 1 tablet (20 mg total) by mouth 4 (four) times daily -  before meals and at bedtime. Patient not taking: Reported on 07/19/2018 07/10/18   [name], MD  loperamide (IMODIUM) 2 MG capsule Take 1 capsule (2 mg total) by mouth 2 (two) times daily. Patient not taking: Reported on 07/06/2018 06/01/18   Reyne Dumas, MD  ondansetron (ZOFRAN) 4 MG tablet Take 1 tablet (4 mg total) by mouth every 6 (six) hours as needed for nausea. Patient not taking: Reported on 07/06/2018 06/01/18   Reyne Dumas, MD    Current Facility-Administered Medications  Medication Dose Route Frequency Provider Last Rate Last Dose  . acetaminophen (TYLENOL) tablet 650 mg  650 mg Oral Q6H PRN [place] Donnamarie Poag, DO       Or  . acetaminophen (TYLENOL) suppository 650 mg  650 mg Rectal Q6H PRN [place] Eric J, DO      . clonazePAM [facility]) tablet 0.5 mg  0.5 mg  Oral QHS PRN [place] Eric J, DO      . dextrose 5 %-0.9 % sodium chloride infusion   Intravenous Continuous Shelly Coss, MD 75 mL/hr at 07/20/18 0912    . heparin injection 5,000 Units  5,000 Units Subcutaneous Q8H [place] Donnamarie Poag, DO      . HYDROcodone-acetaminophen (NORCO/VICODIN) 5-325 MG per tablet 1-2 tablet  1-2 tablet Oral Q4H PRN [place] Eric J, DO      . iohexol (OMNIPAQUE) 300 MG/ML solution 30 mL  30 mL Oral Once PRN Shelly Coss, MD      . morphine 2 MG/ML injection 2 mg  2 mg Intravenous Q4H PRN [place] Donnamarie Poag, DO   2 mg at 07/20/18 0122    Allergies as of 07/19/2018 - Review Complete 07/19/2018  Allergen Reaction Noted  . Asacol [mesalamine] Diarrhea 03/09/2011  . Flagyl [metronidazole] Diarrhea 02/05/2016  . Azathioprine Diarrhea 08/07/2010  . Gluten meal Diarrhea 03/21/2015  . Mycophenolate mofetil Diarrhea 08/07/2010  . Other  01/18/2012  . Wheat bran Diarrhea 01/18/2012  . Tape Itching and Rash 03/21/2015    Family History  Problem Relation Age of Onset  . Heart disease Father   . Thyroid cancer Father   . Allergies Father   . Clotting disorder Father   . Breast cancer Mother   . Stomach cancer Mother   . Colon cancer Neg Hx   . Esophageal cancer Neg Hx   . Rectal cancer Neg Hx     Social History   Socioeconomic History  . Marital status: Single    Spouse name: Not on file  . Number of children: 1  . Years of education: Not on file  . Highest education level: Not on file  Occupational History  . Occupation: Disability   Social Needs  . Financial resource strain: Not on file  . Food insecurity:    Worry: Not on file    Inability: Not on file  . Transportation needs:    Medical: Not on file    Non-medical:  Not on file  Tobacco Use  . Smoking status: Former Smoker    Packs/day: 1.00    Years: 15.00    Pack years: 15.00    Types: Cigarettes    Last attempt to quit: 03/28/1997    Years since quitting: 21.3  . Smokeless tobacco: Never  Used  Substance and Sexual Activity  . Alcohol use: No    Alcohol/week: 0.0 standard drinks  . Drug use: No    Comment: QUIT USING DRUGS IN 1997  . Sexual activity: Not Currently  Lifestyle  . Physical activity:    Days per week: Not on file    Minutes per session: Not on file  . Stress: Not on file  Relationships  . Social connections:    Talks on phone: Not on file    Gets together: Not on file    Attends religious service: Not on file    Active member of club or organization: Not on file    Attends meetings of clubs or organizations: Not on file    Relationship status: Not on file  . Intimate partner violence:    Fear of current or ex partner: Not on file    Emotionally abused: Not on file    Physically abused: Not on file    Forced sexual activity: Not on file  Other Topics Concern  . Not on file  Social History Narrative   Single, history of substance abuse in recovery   Previously occupied on medical disability   1 child    Elderly parents involved and he helps them   1 brother in [place]    Review of Systems: All systems reviewed and negative except where noted in HPI.  Physical Exam: Vital signs in last 24 hours: Temp:  [97.2 F (36.2 C)-98.2 F (36.8 C)] 98.2 F (36.8 C) (05/21 0551) Pulse Rate:  [71-103] 79 (05/21 0551) Resp:  [12-19] 15 (05/21 0551) BP: (125-164)/(77-97) 125/77 (05/21 0551) SpO2:  [99 %-100 %] 99 % (05/21 0551) Weight:  [70.3 kg] 70.3 kg (05/20 1340)   General:   Alert, thin male in NAD Psych:  Pleasant, cooperative. Flat affect. Eyes:  Pupils equal, sclera clear, no icterus.   Conjunctiva pink. Ears:  Normal auditory acuity. Nose:  No deformity, discharge,  or lesions. Neck:  Supple; no masses Lungs:  Clear throughout to auscultation.   No wheezes, crackles, or rhonchi.  Heart:  Regular rate and rhythm; no murmurs, no lower extremity edema Abdomen:  Soft, non-distended, nontender, BS active, no palp mass    Rectal:   Deferred  Msk:  Symmetrical without gross deformities. . Neurologic:  Alert and  oriented x4;  grossly normal neurologically. Skin:  Intact without significant lesions or rashes.   Intake/Output from previous day: 05/20 0701 - 05/21 0700 In: 2200 [I.V.:1200; IV Piggyback:1000] Out: -  Intake/Output this shift: No intake/output data recorded.  Lab Results: Recent Labs    07/19/18 1358 07/19/18 2124 07/20/18 0623  WBC 8.3 4.2 5.9  HGB 13.2 11.5* 11.0*  HCT 41.8 37.3* 36.3*  PLT 280 236 235   BMET Recent Labs    07/19/18 1358 07/19/18 2124 07/20/18 0623  NA 139  --  139  K 3.4*  --  3.6  CL 99  --  108  CO2 31  --  26  GLUCOSE 91  --  59*  BUN 8  --  6  CREATININE 0.89 0.82 0.89  CALCIUM 8.3*  --  7.9*  LFT Recent Labs    07/19/18 1358  PROT 6.0*  ALBUMIN 2.9*  AST 22  ALT 18  ALKPHOS 293*  BILITOT 0.5   PT/INR No results for input(s): LABPROT, INR in the last 72 hours. Hepatitis Panel No results for input(s): HEPBSAG, HCVAB, HEPAIGM, HEPBIGM in the last 72 hours.    Studies/Results: Dg Abdomen Acute W/chest  Result Date: 07/19/2018 CLINICAL DATA:  Abdominal pain with nausea and diarrhea. History of Crohn's disease EXAM: DG ABDOMEN ACUTE W/ 1V CHEST COMPARISON:  Chest radiograph September 05, 2015; CT abdomen and pelvis April 26, 2018 FINDINGS: PA chest: No edema or consolidation. Heart size and pulmonary vascularity are normal. No adenopathy. Supine and upright abdomen: There are several loops of borderline dilated small bowel with scattered air-fluid levels. No evident free air. There is air in the colon and rectum. There is a probable phlebolith in the lower left pelvis. IMPRESSION: Scattered air-fluid levels with several loops of borderline dilated small bowel. Suspect a degree of enteritis or ileus. The earliest changes of partial small-bowel obstruction cannot be excluded. Note that there is air in the colon and rectum. No free air. Lungs clear.  Electronically Signed   By: Lowella Grip III M.D.   On: 07/19/2018 15:40     Tye Savoy, NP-C @  07/20/2018, 9:13 AM

## 2018-07-20 NOTE — Progress Notes (Signed)
PROGRESS NOTE    [name]  KCL:[id number] DOB: November 12, 1965 DOA: 07/19/2018 PCP: [name], MD   Brief Narrative: Patient is a 53 year old male with history of Crohn's disease, abdominal pruritus, depression, chronic pain syndrome who presented to emergency department from home with complaints of progressive abdominal discomfort, nausea, vomiting for last 2 weeks.  He follows with gastroenterology for the treatment of his Crohn's disease.  Patient was very frustrated with his ongoing medical issues, was very emotional and severely depressed on presentation.  GI, psychiatry consulted.  Patient also requested for Palliative care consultation. Abdominal x-ray done on presentation was also was suspicious for partial small bowel obstruction.  Assessment & Plan:   Principal Problem:   Partial small bowel obstruction ([name]) Active Problems:   CROHN'S DISEASE, LARGE AND SMALL INTESTINES   Long term current use of systemic steroids   Major depressive disorder, recurrent episode, severe ([name])   Long-term use of immunosuppressant medication - Entyvio   Loss of weight   Abdominal pain  Suspected small bowel obstruction: Patient having bowel movements this morning, passing gas.  Likely resolved.  Patient getting CT abdomen/pelvis.  Will check on that.  Continue n.p.o. for now. Denies any abdominal pain this morning.  Abdomen is soft and nontender.  Crohn's disease: Has history of Crohn's disease and follows with GI.  Currently on outpatient therapy with daily prednisone and vendolizumab..  GI following.  He gives history of weight loss.  History of autoimmune hepatitis/primary sclerosing colitis: On steroids.  GI following.  Elevated alkaline phosphatase.  Major depressive disorder: Appears severely depressed.  He says he does not want to live with his misery.He  denies any suicidal ideations or thoughts.  Psychiatry consulted.  Goals of care discussion/multiple comorbidities:  Patient has requested for palliative care evaluation.  As per the GI note, he was also following with hospice few years ago.           DVT prophylaxis: SCD Code Status: Full Family Communication: None present at the bedside Disposition Plan: Needs to be determined   Consultants: GI, psychiatry  Procedures: None  Antimicrobials:  Anti-infectives (From admission, onward)   None      Subjective: Patient seen and examined the bedside this morning.  Hemodynamically stable.  Has been having bowel movements today  more than once.  He said he had bloody bowel movements.  Any abdominal pain, nausea or vomiting.  Passing gas.  Appears depressed  Objective: Vitals:   07/19/18 1815 07/19/18 1830 07/19/18 2020 07/20/18 0551  BP:  (!) 133/94 (!) 143/94 125/77  Pulse: 72 73 77 79  Resp:   19 15  Temp:   98.2 F (36.8 C) 98.2 F (36.8 C)  TempSrc:   Oral   SpO2: 100% 99% 100% 99%  Weight:      Height:        Intake/Output Summary (Last 24 hours) at 07/20/2018 1157 Last data filed at 07/20/2018 1100 Gross per 24 hour  Intake 2200 ml  Output 650 ml  Net 1550 ml   Filed Weights   07/19/18 1340  Weight: 70.3 kg    Examination:  General exam: Appears calm and comfortable ,Not in distress,thin built HEENT:PERRL,Oral mucosa moist, Ear/Nose normal on gross exam Respiratory system: Bilateral equal air entry, normal vesicular breath sounds, no wheezes or crackles  Cardiovascular system: S1 & S2 heard, RRR. No JVD, murmurs, rubs, gallops or clicks. No pedal edema. Gastrointestinal system: Abdomen is nondistended, soft and nontender. No organomegaly or  masses felt. Normal bowel sounds heard. Central nervous system: Alert and oriented. No focal neurological deficits. Extremities: No edema, no clubbing ,no cyanosis, distal peripheral pulses palpable. Skin: No rashes, lesions or ulcers,no icterus ,no pallor MSK: Normal muscle bulk,tone ,power Psychiatry: depressed    Data  Reviewed: I have personally reviewed following labs and imaging studies  CBC: Recent Labs  Lab 07/19/18 1358 07/19/18 2124 07/20/18 0623  WBC 8.3 4.2 5.9  HGB 13.2 11.5* 11.0*  HCT 41.8 37.3* 36.3*  MCV 88.4 89.2 90.1  PLT 280 236 627   Basic Metabolic Panel: Recent Labs  Lab 07/19/18 1358 07/19/18 2124 07/20/18 0623  NA 139  --  139  K 3.4*  --  3.6  CL 99  --  108  CO2 31  --  26  GLUCOSE 91  --  59*  BUN 8  --  6  CREATININE 0.89 0.82 0.89  CALCIUM 8.3*  --  7.9*   GFR: Estimated Creatinine Clearance: 93.3 mL/min (by C-G formula based on SCr of 0.89 mg/dL). Liver Function Tests: Recent Labs  Lab 07/19/18 1358  AST 22  ALT 18  ALKPHOS 293*  BILITOT 0.5  PROT 6.0*  ALBUMIN 2.9*   Recent Labs  Lab 07/19/18 1358  LIPASE 24   No results for input(s): AMMONIA in the last 168 hours. Coagulation Profile: No results for input(s): INR, PROTIME in the last 168 hours. Cardiac Enzymes: No results for input(s): CKTOTAL, CKMB, CKMBINDEX, TROPONINI in the last 168 hours. BNP (last 3 results) No results for input(s): PROBNP in the last 8760 hours. HbA1C: No results for input(s): HGBA1C in the last 72 hours. CBG: No results for input(s): GLUCAP in the last 168 hours. Lipid Profile: No results for input(s): CHOL, HDL, LDLCALC, TRIG, CHOLHDL, LDLDIRECT in the last 72 hours. Thyroid Function Tests: No results for input(s): TSH, T4TOTAL, FREET4, T3FREE, THYROIDAB in the last 72 hours. Anemia Panel: No results for input(s): VITAMINB12, FOLATE, FERRITIN, TIBC, IRON, RETICCTPCT in the last 72 hours. Sepsis Labs: No results for input(s): PROCALCITON, LATICACIDVEN in the last 168 hours.  Recent Results (from the past 240 hour(s))  SARS Coronavirus 2 (CEPHEID - Performed in [place] hospital lab), Hosp Order     Status: None   Collection Time: 07/19/18  2:17 PM  Result Value Ref Range Status   SARS Coronavirus 2 NEGATIVE NEGATIVE Final    Comment: (NOTE) If result  is NEGATIVE SARS-CoV-2 target nucleic acids are NOT DETECTED. The SARS-CoV-2 RNA is generally detectable in upper and lower  respiratory specimens during the acute phase of infection. The lowest  concentration of SARS-CoV-2 viral copies this assay can detect is 250  copies / mL. A negative result does not preclude SARS-CoV-2 infection  and should not be used as the sole basis for treatment or other  patient management decisions.  A negative result may occur with  improper specimen collection / handling, submission of specimen other  than nasopharyngeal swab, presence of viral mutation(s) within the  areas targeted by this assay, and inadequate number of viral copies  (<250 copies / mL). A negative result must be combined with clinical  observations, patient history, and epidemiological information. If result is POSITIVE SARS-CoV-2 target nucleic acids are DETECTED. The SARS-CoV-2 RNA is generally detectable in upper and lower  respiratory specimens dur ing the acute phase of infection.  Positive  results are indicative of active infection with SARS-CoV-2.  Clinical  correlation with patient history and other diagnostic information  is  necessary to determine patient infection status.  Positive results do  not rule out bacterial infection or co-infection with other viruses. If result is PRESUMPTIVE POSTIVE SARS-CoV-2 nucleic acids MAY BE PRESENT.   A presumptive positive result was obtained on the submitted specimen  and confirmed on repeat testing.  While 2019 novel coronavirus  (SARS-CoV-2) nucleic acids may be present in the submitted sample  additional confirmatory testing may be necessary for epidemiological  and / or clinical management purposes  to differentiate between  SARS-CoV-2 and other Sarbecovirus currently known to infect humans.  If clinically indicated additional testing with an alternate test  methodology [phone]) is advised. The SARS-CoV-2 RNA is generally   detectable in upper and lower respiratory sp ecimens during the acute  phase of infection. The expected result is Negative. Fact Sheet for Patients:  StrictlyIdeas.no Fact Sheet for Healthcare Providers: BankingDealers.co.za This test is not yet approved or cleared by the [place] FDA and has been authorized for detection and/or diagnosis of SARS-CoV-2 by FDA under an Emergency Use Authorization (EUA).  This EUA will remain in effect (meaning this test can be used) for the duration of the COVID-19 declaration under Section 564(b)(1) of the Act, 21 U.S.C. section 360bbb-3(b)(1), unless the authorization is terminated or revoked sooner. Performed at [facility], [place] [address] [place], [place] [address]          Radiology Studies: Dg Abdomen Acute W/chest  Result Date: 07/19/2018 CLINICAL DATA:  Abdominal pain with nausea and diarrhea. History of Crohn's disease EXAM: DG ABDOMEN ACUTE W/ 1V CHEST COMPARISON:  Chest radiograph September 05, 2015; CT abdomen and pelvis April 26, 2018 FINDINGS: PA chest: No edema or consolidation. Heart size and pulmonary vascularity are normal. No adenopathy. Supine and upright abdomen: There are several loops of borderline dilated small bowel with scattered air-fluid levels. No evident free air. There is air in the colon and rectum. There is a probable phlebolith in the lower left pelvis. IMPRESSION: Scattered air-fluid levels with several loops of borderline dilated small bowel. Suspect a degree of enteritis or ileus. The earliest changes of partial small-bowel obstruction cannot be excluded. Note that there is air in the colon and rectum. No free air. Lungs clear. Electronically Signed   By: Lowella Grip III M.D.   On: 07/19/2018 15:40        Scheduled Meds: . heparin  5,000 Units Subcutaneous Q8H  . sodium chloride (PF)       Continuous Infusions: . dextrose 5 % and 0.9%  NaCl 75 mL/hr at 07/20/18 0912     LOS: 1 day    Time spent: More than 50% of that time was spent in counseling and/or coordination of care.      Shelly Coss, MD [facility] Pager [phone]  If 7PM-7AM, please contact night-coverage [email or web address] Password [facility] 07/20/2018, 11:57 AM

## 2018-07-20 NOTE — Consult Note (Signed)
Consultation Note Date: 07/20/2018   Patient Name: [name]  DOB: [birth year]  MRN: [id number]  Age / Sex: 53 y.o., male  PCP: [name], MD Referring Physician: Shelly Coss, MD  Reason for Consultation: Establishing goals of care  HPI/Patient Profile: 53 y.o. male  with past medical history of Crohns disease, autoimmune hepatitis, depression admitted on 07/19/2018 with worsening abdominal pain, diarrhea, and nausea.  He was last seen by our team several years ago and transitioned home with hospice support.  He then improved and revoked his hospice benefits.  Noted to be reporting that he no longer desires treatment and wants palliative care.  Palliative consulted for goals of care.   Clinical Assessment and Goals of Care: I met today with [name].   I introduced palliative care as specialized medical care for people living with serious illness. It focuses on providing relief from the symptoms and stress of a serious illness. The goal is to improve quality of life for both the patient and the family.  He reports living with his parents who help him with his daily living.  States he is "tired of being so sick" and not wanting to continue medical therapies.  We discussed clinical course as well as wishes moving forward in regard to care plan moving forward.  Concepts specific to code status and rehospitalization discussed.  We discussed difference between an aggressive medical intervention path and a palliative, comfort focused care path per his request.    Concept of Hospice and Palliative Care were discussed  Questions and concerns addressed.   PMT will continue to support holistically.   SUMMARY OF RECOMMENDATIONS   - [name] reports that he wants to consider transition home with the support of hospice.  He has been on service with hospice in the past.  We reviewed home hospice  benefits at his request.   - I told [name] I will reach out to [name] as well as [facility] to discuss his case tomorrow.  Plan for follow-up tomorrow to continue discussion with him. - Symptom management as below.  Code Status/Advance Care Planning:  DNR   Symptom Management:   Pain: Reports that he gets relief from morphine, but medication only lasting 2 hours.  Will increase frequency of morphine to 30m every 2 hours as needed.  Anxiety: Addition of ativan as needed  Palliative Prophylaxis:   Frequent Pain Assessment  Psycho-social/Spiritual:   Desire for further Chaplaincy support:no  Additional Recommendations: Caregiving  Support/Resources  Prognosis:   He reports continued decline in his nutrition and functional status.  If he foregoes further treatment, I believe that it is likely that his prognosis if his disease follows its natural course is < 6 months   Discharge Planning: To Be Determined      Primary Diagnoses: Present on Admission: . Abdominal pain . [place] small bowel obstruction ([place] . CROHN'S DISEASE, LARGE AND SMALL INTESTINES . Major depressive disorder, recurrent episode, severe ([place] . Loss of weight   I have reviewed the medical  record, interviewed the patient and family, and examined the patient. The following aspects are pertinent.  Past Medical History:  Diagnosis Date  . Allergy   . Anal fistula   . Anxiety   . 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 Autoimmune hepatitis ([place]) 01/19/2013  . Avoidant-restrictive food intake disorder (ARFID) ? 06/15/2018  . Cancer of skin of neck   . Cataract   . Chronic mesenteric ischemia ([place])   . Chronic pain syndrome 07/09/2016  . Crohn's disease of small and large intestines ([place])    followed by dr Glendell Docker gessner  . Dairy product intolerance   . Diarrhea, functional   . Family history of adverse reaction to anesthesia   . Grover's disease    transient acantholytic dermatosis  .  History of alcohol abuse   . History of basal cell carcinoma excision    2013 left leg  . History of Clostridium difficile    10/ 2014  . History of multiple concussions    x6   last one Jan 2017 per pt--  no residual  . History of substance abuse ([place])    quit 1997 per pt  . History of suicide attempt    05-18-2012  overdose /  failure to thrive  . Hypercholesterolemia   . Iron deficiency anemia due to chronic blood loss   . Major depression, recurrent, chronic ([place])   . Osteopenia   . Personal history of adenomatous colonic polyps 12/2010, 03/2012   12/2010 - 8 mm serrated adenoma of rectum  . Portal vein thrombosis 03/21/2015   right  . Primary sclerosing cholangitis    ? hepatitis overlap - liver bx x 2 and MRCP  . Seasonal allergies   . Substance abuse ([place]) 1997   Alcohol  . Vitamin A deficiency 05/23/2018  . Vitamin C deficiency 05/23/2018   Social History   Socioeconomic History  . Marital status: Single    Spouse name: Not on file  . Number of children: 1  . Years of education: Not on file  . Highest education level: Not on file  Occupational History  . Occupation: Disability   Social Needs  . Financial resource strain: Not on file  . Food insecurity:    Worry: Not on file    Inability: Not on file  . Transportation needs:    Medical: Not on file    Non-medical: Not on file  Tobacco Use  . Smoking status: Former Smoker    Packs/day: 1.00    Years: 15.00    Pack years: 15.00    Types: Cigarettes    Last attempt to quit: 03/28/1997    Years since quitting: 21.3  . Smokeless tobacco: Never Used  Substance and Sexual Activity  . Alcohol use: No    Alcohol/week: 0.0 standard drinks  . Drug use: No    Comment: QUIT USING DRUGS IN 1997  . Sexual activity: Not Currently  Lifestyle  . Physical activity:    Days per week: Not on file    Minutes per session: Not on file  . Stress: Not on file  Relationships  . Social connections:    Talks on phone: Not on  file    Gets together: Not on file    Attends religious service: Not on file    Active member of club or organization: Not on file    Attends meetings of clubs or organizations: Not on file    Relationship status: Not on file  Other Topics Concern  . Not on file  Social History Narrative   Single, history of substance abuse in recovery   Previously occupied on medical disability   1 child    Elderly parents involved and he helps them   1 brother in [place]   Family History  Problem Relation Age of Onset  . Heart disease Father   . Thyroid cancer Father   . Allergies Father   . Clotting disorder Father   . Breast cancer Mother   . Stomach cancer Mother   . Colon cancer Neg Hx   . Esophageal cancer Neg Hx   . Rectal cancer Neg Hx    Scheduled Meds: . methylPREDNISolone (SOLU-MEDROL) injection  10 mg Intravenous Daily  . sodium chloride (PF)       Continuous Infusions: . dextrose 5 % and 0.9% NaCl 75 mL/hr at 07/20/18 0912   PRN Meds:.acetaminophen **OR** acetaminophen, clonazePAM, HYDROcodone-acetaminophen, iohexol, LORazepam, morphine injection Medications Prior to Admission:  Prior to Admission medications   Medication Sig Start Date End Date Taking? Authorizing Provider  clonazePAM (KLONOPIN) 0.5 MG tablet Take 1 tablet (0.5 mg total) by mouth at bedtime as needed (insomnia). Patient taking differently: Take 0.25 mg by mouth 3 (three) times daily as needed for anxiety.  11/28/14  Yes [name], MD  HYDROcodone-acetaminophen (NORCO/VICODIN) 5-325 MG tablet Take 1-2 tablets by mouth every 6 (six) hours as needed for moderate pain or severe pain. Patient taking differently: Take 0.5 tablets by mouth every 6 (six) hours as needed for moderate pain or severe pain.  06/15/18  Yes [name], MD  predniSONE (DELTASONE) 5 MG tablet Take 5 mg by mouth daily with breakfast.   Yes [provider]  AMBULATORY NON FORMULARY MEDICATION Dilitazem 2% cream   Apply pea size amount to rectum twice daily Patient not taking: Reported on 07/06/2018 05/02/18   [name], MD  dicyclomine (BENTYL) 20 MG tablet Take 1 tablet (20 mg total) by mouth 4 (four) times daily -  before meals and at bedtime. Patient not taking: Reported on 07/19/2018 07/10/18   [name], MD  loperamide (IMODIUM) 2 MG capsule Take 1 capsule (2 mg total) by mouth 2 (two) times daily. Patient not taking: Reported on 07/06/2018 06/01/18   Reyne Dumas, MD  ondansetron (ZOFRAN) 4 MG tablet Take 1 tablet (4 mg total) by mouth every 6 (six) hours as needed for nausea. Patient not taking: Reported on 07/06/2018 06/01/18   Reyne Dumas, MD   Allergies  Allergen Reactions  . Asacol [Mesalamine] Diarrhea    abd pain  . Flagyl [Metronidazole] Diarrhea    abd pain  . Azathioprine Diarrhea    abd pain  . Gluten Meal Diarrhea  . Mycophenolate Mofetil Diarrhea    abd pain  . Other     NUTS; constipation  . Wheat Bran Diarrhea    Constipation; flatulence; abd pain  . Tape Itching and Rash    Please use "paper" tape   Review of Systems  Constitutional: Positive for activity change, appetite change and unexpected weight change. Negative for fatigue.  Gastrointestinal: Positive for abdominal pain, blood in stool and diarrhea.  Neurological: Positive for weakness.  Psychiatric/Behavioral: Positive for decreased concentration, dysphoric mood and sleep disturbance.   Physical Exam  General: Alert, awake, in no acute distress.  Frail and chronically ill appearing.  HEENT: No bruits, no goiter, no JVD Heart: Regular rate and rhythm. No murmur appreciated. Lungs: Good  air movement, clear Abdomen: Soft, nontender, nondistended, positive bowel sounds.  Ext: No significant edema Skin: Warm and dry Neuro: Grossly intact, nonfocal.  Vital Signs: BP 118/71 (BP Location: Left Arm)   Pulse 76   Temp 98.2 F (36.8 C)   Resp 19   Ht 6' 2"  (1.88 m)   Wt 70.3 kg   SpO2 97%   BMI 19.90  kg/m  Pain Scale: 0-10   Pain Score: 0-No pain   SpO2: SpO2: 97 % O2 Device:SpO2: 97 % O2 Flow Rate: .   IO: Intake/output summary:   Intake/Output Summary (Last 24 hours) at 07/20/2018 2111 Last data filed at 07/20/2018 2024 Gross per 24 hour  Intake 1200 ml  Output 1700 ml  Net -500 ml    LBM:   Baseline Weight: Weight: 70.3 kg Most recent weight: Weight: 70.3 kg     Palliative Assessment/Data:   Flowsheet Rows     Most Recent Value  Intake Tab  Referral Department  Hospitalist  Unit at Time of Referral  ER  Palliative Care Primary Diagnosis  Other (Comment) [GI]  Date Notified  07/19/18  Palliative Care Type  Return patient Palliative Care  Reason for referral  Clarify Goals of Care  Date of Admission  07/19/18  Date first seen by Palliative Care  07/20/18  # of days Palliative referral response time  1 Day(s)  # of days IP prior to Palliative referral  0  Clinical Assessment  Palliative Performance Scale Score  40%  Psychosocial & Spiritual Assessment  Palliative Care Outcomes  Patient/Family meeting held?  Yes  Who was at the meeting?  Patient      Time In: 1710 Time Out: 1830 Time Total: 80 Greater than 50%  of this time was spent counseling and coordinating care related to the above assessment and plan.  Signed by: Micheline Rough, MD   Please contact Palliative Medicine Team phone at [phone] for questions and concerns.  For individual provider: See Shea Evans

## 2018-07-20 NOTE — Consult Note (Signed)
Telepsych Consultation   Reason for Consult:  Depression  Referring Physician:  Dr. Shelly Coss Location of Patient:  [facility] Location of Provider: [facility]  Patient Identification: [name] MRN:  [id number] Principal Diagnosis: Anxiety and depression Diagnosis:  Principal Problem:   Partial small bowel obstruction ([name]) Active Problems:   CROHN'S DISEASE, LARGE AND SMALL INTESTINES   Long term current use of systemic steroids   Major depressive disorder, recurrent episode, severe ([name])   Long-term use of immunosuppressant medication - Entyvio   Loss of weight   Abdominal pain   Total Time spent with patient: 1 hour  Subjective:   [name] is a 53 y.o. male patient admitted with Crohn's disease.  HPI:   Per chart review, patient was admitted with Crohn's disease in the setting of noncompliance with medications. He self tapered Prednisone down to 5 mg because he thought that it was causing worsening abdominal pain. He was taking Remeron for depression but discontinued it because he also thought it was causing worsening abdominal pain. Treatment has been complicated by severe depression and anxiety. He is requesting more palliative options secondary to depression. He has a history of hospice care several years ago but he improved and and did well for a few days.   On interview, [name] reports that his depression is circumstantial due to worsening symptoms of his medical condition since January. He was placed on disability in 2011. He was previously very active in biking, golfing and an active member of [employer]. He reports that he experienced worsening symptoms of his Crohn's disease 6 years ago and he chose palliative care treatment. He reports, "I am tired of all of the medications and side effects. I do not want iron infusions. I have chronic gas." He reports that he is limited to the foods that he can eat. He reports a slight  improvement in medication side effects since self-tapering his medications. He denies SI, HI or AVH. He does reports a history of suicide attempts and most recently a month ago by cutting. He informed his parents after the attempt. He reports that he can speak to his parents about anything. He denies any intention to harm himself. He reports that he does not plan to harm himself again because "it was too violent." He is willing to seek help if he has recurrent SI. He declines medication management for depression after an extensive conversation about the potential benefits of treatment.      Past Psychiatric History: Depression and anxiety. He reports a history of two suicide attempts by drug overdose in 2015 and cutting his neck a month ago.   Risk to Self:  None. Denies SI.  Risk to Others:  None. Denies HI.  Prior Inpatient Therapy:  Denies  Prior Outpatient Therapy:  Denies   Past Medical History:  Past Medical History:  Diagnosis Date  . Allergy   . Anal fistula   . Anxiety   . 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 Autoimmune hepatitis ([place]) 01/19/2013  . Avoidant-restrictive food intake disorder (ARFID) ? 06/15/2018  . Cancer of skin of neck   . Cataract   . Chronic mesenteric ischemia ([place])   . Chronic pain syndrome 07/09/2016  . Crohn's disease of small and large intestines ([place])    followed by dr Glendell Docker gessner  . Dairy product intolerance   . Diarrhea, functional   . Family history of adverse reaction to anesthesia   .  Grover's disease    transient acantholytic dermatosis  . History of alcohol abuse   . History of basal cell carcinoma excision    2013 left leg  . History of Clostridium difficile    10/ 2014  . History of multiple concussions    x6   last one Jan 2017 per pt--  no residual  . History of substance abuse ([place])    quit 1997 per pt  . History of suicide attempt    05-18-2012  overdose /  failure to thrive  . Hypercholesterolemia   . Iron deficiency  anemia due to chronic blood loss   . Major depression, recurrent, chronic ([place])   . Osteopenia   . Personal history of adenomatous colonic polyps 12/2010, 03/2012   12/2010 - 8 mm serrated adenoma of rectum  . Portal vein thrombosis 03/21/2015   right  . Primary sclerosing cholangitis    ? hepatitis overlap - liver bx x 2 and MRCP  . Seasonal allergies   . Substance abuse ([place]) 1997   Alcohol  . Vitamin A deficiency 05/23/2018  . Vitamin C deficiency 05/23/2018    Past Surgical History:  Procedure Laterality Date  . ABDOMINAL AORTAGRAM N/A 03/29/2012   Procedure: ABDOMINAL Maxcine Ham;  Surgeon: Serafina Mitchell, MD;  Location: [facility] CATH LAB;  Service: Cardiovascular;  Laterality: N/A;  . ADENOIDECTOMY  age 86  . BIOPSY  05/31/2018   Procedure: BIOPSY;  Surgeon: Milus Banister, MD;  Location: [facility];  Service: Endoscopy;;  . CATARACT EXTRACTION W/ INTRAOCULAR LENS  IMPLANT, BILATERAL  2009  . COLONOSCOPY  2001, 05/02/2003, 01/28/11   2012: Right colon Crohn's, rectal polyp  . COLONOSCOPY  03/31/2012   Procedure: COLONOSCOPY;  Surgeon: Jerene Bears, MD;  Location: [place];  Service: Gastroenterology;  Laterality: N/A;  . COLONOSCOPY WITH PROPOFOL N/A 05/31/2018   Procedure: COLONOSCOPY WITH PROPOFOL;  Surgeon: Milus Banister, MD;  Location: [facility];  Service: Endoscopy;  Laterality: N/A;  . ESOPHAGOGASTRODUODENOSCOPY  01/28/11   Normal  . FOOT SURGERY Right age 2  . MOHS SURGERY Left 11/2013   left ankle parakerotosis   . PERCUTANEOUS LIVER BIOPSY  2007 and 2008  . PILONIDAL CYST EXCISION  age 69  . PLACEMENT OF SETON N/A 11/06/2015   Procedure: PLACEMENT OF SETON;  Surgeon: [name], MD;  Location: [facility];  Service: General;  Laterality: N/A;  . UPPER GASTROINTESTINAL ENDOSCOPY     Family History:  Family History  Problem Relation Age of Onset  . Heart disease Father   . Thyroid cancer Father   . Allergies Father   . Clotting disorder Father    . Breast cancer Mother   . Stomach cancer Mother   . Colon cancer Neg Hx   . Esophageal cancer Neg Hx   . Rectal cancer Neg Hx    Family Psychiatric  History: Mother-depression and anxiety.  Social History:  Social History   Substance and Sexual Activity  Alcohol Use No  . Alcohol/week: 0.0 standard drinks     Social History   Substance and Sexual Activity  Drug Use No   Comment: QUIT USING DRUGS IN 1997    Social History   Socioeconomic History  . Marital status: Single    Spouse name: Not on file  . Number of children: 1  . Years of education: Not on file  . Highest education level: Not on file  Occupational History  . Occupation: Disability   Social  Needs  . Financial resource strain: Not on file  . Food insecurity:    Worry: Not on file    Inability: Not on file  . Transportation needs:    Medical: Not on file    Non-medical: Not on file  Tobacco Use  . Smoking status: Former Smoker    Packs/day: 1.00    Years: 15.00    Pack years: 15.00    Types: Cigarettes    Last attempt to quit: 03/28/1997    Years since quitting: 21.3  . Smokeless tobacco: Never Used  Substance and Sexual Activity  . Alcohol use: No    Alcohol/week: 0.0 standard drinks  . Drug use: No    Comment: QUIT USING DRUGS IN 1997  . Sexual activity: Not Currently  Lifestyle  . Physical activity:    Days per week: Not on file    Minutes per session: Not on file  . Stress: Not on file  Relationships  . Social connections:    Talks on phone: Not on file    Gets together: Not on file    Attends religious service: Not on file    Active member of club or organization: Not on file    Attends meetings of clubs or organizations: Not on file    Relationship status: Not on file  Other Topics Concern  . Not on file  Social History Narrative   Single, history of substance abuse in recovery   Previously occupied on medical disability   1 child    Elderly parents involved and he helps them    1 brother in [place]   Additional Social History: He lives with his parents. He receives disability. He denies alcohol or illicit substance use. He has been sober from alcohol use for 23 years.     Allergies:   Allergies  Allergen Reactions  . Asacol [Mesalamine] Diarrhea    abd pain  . Flagyl [Metronidazole] Diarrhea    abd pain  . Azathioprine Diarrhea    abd pain  . Gluten Meal Diarrhea  . Mycophenolate Mofetil Diarrhea    abd pain  . Other     NUTS; constipation  . Wheat Bran Diarrhea    Constipation; flatulence; abd pain  . Tape Itching and Rash    Please use "paper" tape    Labs:  Results for orders placed or performed during the hospital encounter of 07/19/18 (from the past 48 hour(s))  Lipase, blood     Status: None   Collection Time: 07/19/18  1:58 PM  Result Value Ref Range   Lipase 24 11 - 51 U/L    Comment: Performed at [facility], [place] [address], [place], [place] [address]  Comprehensive metabolic panel     Status: Abnormal   Collection Time: 07/19/18  1:58 PM  Result Value Ref Range   Sodium 139 135 - 145 mmol/L   Potassium 3.4 (L) 3.5 - 5.1 mmol/L   Chloride 99 98 - 111 mmol/L   CO2 31 22 - 32 mmol/L   Glucose, Bld 91 70 - 99 mg/dL   BUN 8 6 - 20 mg/dL   Creatinine, Ser 0.89 0.61 - 1.24 mg/dL   Calcium 8.3 (L) 8.9 - 10.3 mg/dL   Total Protein 6.0 (L) 6.5 - 8.1 g/dL   Albumin 2.9 (L) 3.5 - 5.0 g/dL   AST 22 15 - 41 U/L   ALT 18 0 - 44 U/L   Alkaline Phosphatase 293 (H) 38 -  126 U/L   Total Bilirubin 0.5 0.3 - 1.2 mg/dL   GFR calc non Af Amer >60 >60 mL/min   GFR calc Af Amer >60 >60 mL/min   Anion gap 9 5 - 15    Comment: Performed at [facility], [place] [address] [place], [place] [address]  CBC     Status: None   Collection Time: 07/19/18  1:58 PM  Result Value Ref Range   WBC 8.3 4.0 - 10.5 K/uL   RBC 4.73 4.22 - 5.81 MIL/uL   Hemoglobin 13.2 13.0 - 17.0 g/dL   HCT 41.8 39.0 - 52.0 %    MCV 88.4 80.0 - 100.0 fL   MCH 27.9 26.0 - 34.0 pg   MCHC 31.6 30.0 - 36.0 g/dL   RDW 15.0 11.5 - 15.5 %   Platelets 280 150 - 400 K/uL   nRBC 0.0 0.0 - 0.2 %    Comment: Performed at [facility], [place] [address], [place], [place] [address]  Acetaminophen level     Status: Abnormal   Collection Time: 07/19/18  1:58 PM  Result Value Ref Range   Acetaminophen (Tylenol), Serum <10 (L) 10 - 30 ug/mL    Comment: (NOTE) Therapeutic concentrations vary significantly. A range of 10-30 ug/mL  may be an effective concentration for many patients. However, some  are best treated at concentrations outside of this range. Acetaminophen concentrations >150 ug/mL at 4 hours after ingestion  and >50 ug/mL at 12 hours after ingestion are often associated with  toxic reactions. Performed at [facility], [place] [address] [place], [place] [address]   Salicylate level     Status: None   Collection Time: 07/19/18  1:58 PM  Result Value Ref Range   Salicylate Lvl <9.2 2.8 - 30.0 mg/dL    Comment: Performed at [facility], [place] [address], [place], [place] [address]  Ethanol     Status: None   Collection Time: 07/19/18  1:58 PM  Result Value Ref Range   Alcohol, Ethyl (B) <10 <10 mg/dL    Comment: (NOTE) Lowest detectable limit for serum alcohol is 10 mg/dL. For medical purposes only. Performed at [facility], [place] [address] [place], [place] [address]   Urinalysis, Routine w reflex microscopic     Status: None   Collection Time: 07/19/18  2:14 PM  Result Value Ref Range   Color, Urine YELLOW YELLOW   APPearance CLEAR CLEAR   Specific Gravity, Urine 1.005 1.005 - 1.030   pH 8.0 5.0 - 8.0   Glucose, UA NEGATIVE NEGATIVE mg/dL   Hgb urine dipstick NEGATIVE NEGATIVE   Bilirubin Urine NEGATIVE NEGATIVE   Ketones, ur NEGATIVE NEGATIVE mg/dL   Protein, ur NEGATIVE NEGATIVE mg/dL   Nitrite NEGATIVE NEGATIVE   Leukocytes,Ua  NEGATIVE NEGATIVE    Comment: Performed at [facility], [place] [address], [place], [place] [address]  SARS Coronavirus 2 (CEPHEID - Performed in [place] hospital lab), Hosp Order     Status: None   Collection Time: 07/19/18  2:17 PM  Result Value Ref Range   SARS Coronavirus 2 NEGATIVE NEGATIVE    Comment: (NOTE) If result is NEGATIVE SARS-CoV-2 target nucleic acids are NOT DETECTED. The SARS-CoV-2 RNA is generally detectable in upper and lower  respiratory specimens during the acute phase of infection. The lowest  concentration of SARS-CoV-2 viral copies this assay can detect is 250  copies / mL. A negative result  does not preclude SARS-CoV-2 infection  and should not be used as the sole basis for treatment or other  patient management decisions.  A negative result may occur with  improper specimen collection / handling, submission of specimen other  than nasopharyngeal swab, presence of viral mutation(s) within the  areas targeted by this assay, and inadequate number of viral copies  (<250 copies / mL). A negative result must be combined with clinical  observations, patient history, and epidemiological information. If result is POSITIVE SARS-CoV-2 target nucleic acids are DETECTED. The SARS-CoV-2 RNA is generally detectable in upper and lower  respiratory specimens dur ing the acute phase of infection.  Positive  results are indicative of active infection with SARS-CoV-2.  Clinical  correlation with patient history and other diagnostic information is  necessary to determine patient infection status.  Positive results do  not rule out bacterial infection or co-infection with other viruses. If result is PRESUMPTIVE POSTIVE SARS-CoV-2 nucleic acids MAY BE PRESENT.   A presumptive positive result was obtained on the submitted specimen  and confirmed on repeat testing.  While 2019 novel coronavirus  (SARS-CoV-2) nucleic acids may be present in the submitted sample   additional confirmatory testing may be necessary for epidemiological  and / or clinical management purposes  to differentiate between  SARS-CoV-2 and other Sarbecovirus currently known to infect humans.  If clinically indicated additional testing with an alternate test  methodology [phone]) is advised. The SARS-CoV-2 RNA is generally  detectable in upper and lower respiratory sp ecimens during the acute  phase of infection. The expected result is Negative. Fact Sheet for Patients:  StrictlyIdeas.no Fact Sheet for Healthcare Providers: BankingDealers.co.za This test is not yet approved or cleared by the [place] FDA and has been authorized for detection and/or diagnosis of SARS-CoV-2 by FDA under an Emergency Use Authorization (EUA).  This EUA will remain in effect (meaning this test can be used) for the duration of the COVID-19 declaration under Section 564(b)(1) of the Act, 21 U.S.C. section 360bbb-3(b)(1), unless the authorization is terminated or revoked sooner. Performed at [facility], [name] [address], [place], [place] [address]   Sedimentation rate     Status: Abnormal   Collection Time: 07/19/18  9:24 PM  Result Value Ref Range   Sed Rate 21 (H) 0 - 16 mm/hr    Comment: Performed at [facility], [place] [address], [place], [place] [address]  C-reactive protein     Status: Abnormal   Collection Time: 07/19/18  9:24 PM  Result Value Ref Range   CRP 4.0 (H) <1.0 mg/dL    Comment: Performed at [facility], [place] [address], [place], [place] [address]  CBC     Status: Abnormal   Collection Time: 07/19/18  9:24 PM  Result Value Ref Range   WBC 4.2 4.0 - 10.5 K/uL   RBC 4.18 (L) 4.22 - 5.81 MIL/uL   Hemoglobin 11.5 (L) 13.0 - 17.0 g/dL   HCT 37.3 (L) 39.0 - 52.0 %   MCV 89.2 80.0 - 100.0 fL   MCH 27.5 26.0 - 34.0 pg   MCHC 30.8 30.0 - 36.0 g/dL   RDW 15.0 11.5 - 15.5 %    Platelets 236 150 - 400 K/uL   nRBC 0.0 0.0 - 0.2 %    Comment: Performed at [facility], [place] [address], [place], [place] [address]  Creatinine, serum     Status: None   Collection Time: 07/19/18  9:24 PM  Result Value Ref Range   Creatinine, Ser 0.82 0.61 - 1.24 mg/dL   GFR calc non Af Amer >60 >60 mL/min   GFR calc Af Amer >60 >60 mL/min    Comment: Performed at [facility], [place] [address], [place], [place] [address]  Basic metabolic panel     Status: Abnormal   Collection Time: 07/20/18  6:23 AM  Result Value Ref Range   Sodium 139 135 - 145 mmol/L   Potassium 3.6 3.5 - 5.1 mmol/L   Chloride 108 98 - 111 mmol/L   CO2 26 22 - 32 mmol/L   Glucose, Bld 59 (L) 70 - 99 mg/dL   BUN 6 6 - 20 mg/dL   Creatinine, Ser 0.89 0.61 - 1.24 mg/dL   Calcium 7.9 (L) 8.9 - 10.3 mg/dL   GFR calc non Af Amer >60 >60 mL/min   GFR calc Af Amer >60 >60 mL/min   Anion gap 5 5 - 15    Comment: Performed at [facility], [place] [address], [place], [place] [address]  CBC     Status: Abnormal   Collection Time: 07/20/18  6:23 AM  Result Value Ref Range   WBC 5.9 4.0 - 10.5 K/uL   RBC 4.03 (L) 4.22 - 5.81 MIL/uL   Hemoglobin 11.0 (L) 13.0 - 17.0 g/dL   HCT 36.3 (L) 39.0 - 52.0 %   MCV 90.1 80.0 - 100.0 fL   MCH 27.3 26.0 - 34.0 pg   MCHC 30.3 30.0 - 36.0 g/dL   RDW 15.0 11.5 - 15.5 %   Platelets 235 150 - 400 K/uL   nRBC 0.0 0.0 - 0.2 %    Comment: Performed at [facility], [place] [address], [place], [place] [address]    Medications:  Current Facility-Administered Medications  Medication Dose Route Frequency Provider Last Rate Last Dose  . acetaminophen (TYLENOL) tablet 650 mg  650 mg Oral Q6H PRN [place] Donnamarie Poag, DO       Or  . acetaminophen (TYLENOL) suppository 650 mg  650 mg Rectal Q6H PRN [place] Eric J, DO      . clon[facility]) tablet 0.5 mg  0.5 mg Oral QHS PRN [place] Eric J, DO      . dextrose 5  %-0.9 % sodium chloride infusion   Intravenous Continuous Shelly Coss, MD 75 mL/hr at 07/20/18 0912    . heparin injection 5,000 Units  5,000 Units Subcutaneous Q8H [place] Donnamarie Poag, DO      . HYDROcodone-acetaminophen (NORCO/VICODIN) 5-325 MG per tablet 1-2 tablet  1-2 tablet Oral Q4H PRN [place] Eric J, DO      . iohexol (OMNIPAQUE) 300 MG/ML solution 30 mL  30 mL Oral Once PRN Shelly Coss, MD      . morphine 2 MG/ML injection 2 mg  2 mg Intravenous Q4H PRN [place] Donnamarie Poag, DO   2 mg at 07/20/18 0122  . sodium chloride (PF) 0.9 % injection             Musculoskeletal: Strength & Muscle Tone: Appears to have equal muscle tone. Gait & Station: UTA since patient is lying in bed. Patient leans: N/A  Psychiatric Specialty Exam: Physical Exam  Nursing note and vitals reviewed. Constitutional: He is oriented to person, place, and time. He appears well-developed and well-nourished.  HENT:  Head: Normocephalic and atraumatic.  Neck: Normal range of motion.  Respiratory: Effort normal.  Musculoskeletal: Normal range of motion.  Neurological: He is alert and oriented to person, place, and time.  Psychiatric: His speech is normal and behavior is normal. Judgment and thought content normal. Cognition and memory are normal. He exhibits a depressed mood.    Review of Systems  Gastrointestinal: Positive for nausea. Negative for vomiting.  Musculoskeletal: Positive for back pain.  Psychiatric/Behavioral: Positive for depression. Negative for hallucinations, substance abuse and suicidal ideas.  All other systems reviewed and are negative.   Blood pressure 125/77, pulse 79, temperature 98.2 F (36.8 C), resp. rate 15, height 6' 2"  (1.88 m), weight 70.3 kg, SpO2 99 %.Body mass index is 19.9 kg/m.  General Appearance: Fairly Groomed, middle aged, Caucasian male, wearing a hospital gown who is lying in bed. NAD.   Eye Contact:  Good  Speech:  Clear and Coherent and Normal Rate  Volume:   Normal  Mood:  Depressed  Affect:  Constricted  Thought Process:  Goal Directed, Linear and Descriptions of Associations: Intact  Orientation:  Full (Time, Place, and Person)  Thought Content:  Logical  Suicidal Thoughts:  No  Homicidal Thoughts:  No  Memory:  Immediate;   Good Recent;   Good Remote;   Good  Judgement:  Fair  Insight:  Fair  Psychomotor Activity:  Normal  Concentration:  Concentration: Good and Attention Span: Good  Recall:  Good  Fund of Knowledge:  Good  Language:  Good  Akathisia:  No  Handed:  Right  AIMS (if indicated):   N/A  Assets:  Agricultural consultant Housing Resilience Social Support  ADL's:  Intact  Cognition:  WNL  Sleep:   Okay   Assessment:  [name] is a 53 y.o. male who was admitted with Crohn's disease in the setting of noncompliance with medications. Patient has good insight about his medical conditions and mental health. He has a clear understanding of his desires for treatment and although he is depressed it is not felt that his depression is impairing his judgement to make sound decisions regarding his medical care. He declines medications as this time for depression after an extensive conversation about the potential benefits of treatment. He denies SI, HI or AVH.   Treatment Plan Summary: -Patient declines medication management for depression at this time. -Continue Klonopin 0.5 mg qhs PRN for insomnia/anxiety.  -Psychiatry will sign off on patient at this time. Please consult psychiatry again as needed.     Disposition: No evidence of imminent risk to self or others at present.    This service was provided via telemedicine using a 2-way, interactive audio and video technology.  Names of all persons participating in this telemedicine service and their role in this encounter. Name: Buford Dresser, DO Role: Psychiatrist  Name: [name] Role: Patient     Faythe Dingwall,  DO 07/20/2018 11:45 AM

## 2018-07-20 NOTE — Progress Notes (Addendum)
    GI primary MD Courtesy Note   Our GI hospitalist team is consulting but I came by to check on [name].  Flatter than baseline affect noted. Abdomen benign.  CT scan does not show anything new really.  I took liberty of starting solumedrol 40 mg IV qd.  Will see what comes from psychiatry and palliative care consults.  I think his main issue is depression with thoughts that his medicines make him worse. I do not know if that is truly delusional but sounds like that to me.  Thanks to all for helping in his care.  [name], MD, [facility] [phone] pager [phone] cell  [name] rejected 40 mg solumedrol so switched to 10 mg.  I also spoke to his mom who's main concern is him having adequate pain control. I tried to explain that I think undertreatment of his Crohn's disease and his depression are contributing to the pain. She accepts [name]'s theory that his medications are making him bleed and feel worse. So I am afraid she doesn't understand either.  [name], MD, [facility]

## 2018-07-21 DIAGNOSIS — F329 Major depressive disorder, single episode, unspecified: Secondary | ICD-10-CM

## 2018-07-21 DIAGNOSIS — F419 Anxiety disorder, unspecified: Secondary | ICD-10-CM

## 2018-07-21 LAB — VITAMIN B12: Vitamin B-12: 492 pg/mL (ref 180–914)

## 2018-07-21 LAB — TSH: TSH: 2.053 u[IU]/mL (ref 0.350–4.500)

## 2018-07-21 LAB — FOLATE: Folate: 2.9 ng/mL — ABNORMAL LOW (ref >5.9)

## 2018-07-21 LAB — IRON AND TIBC
Iron: 15 ug/dL — ABNORMAL LOW (ref 45–182)
Saturation Ratios: 11 % — ABNORMAL LOW (ref 17.9–39.5)
TIBC: 142 ug/dL — ABNORMAL LOW (ref 250–450)
UIBC: 127 ug/dL

## 2018-07-21 LAB — PROTIME-INR
INR: 1.2 (ref 0.8–1.2)
Prothrombin Time: 15.2 seconds (ref 11.4–15.2)

## 2018-07-21 LAB — FERRITIN: Ferritin: 106 ng/mL (ref 24–336)

## 2018-07-21 MED ORDER — FOLIC ACID 1 MG PO TABS: 1.0000 mg | ORAL_TABLET | Freq: Every day | ORAL | 0 refills | Status: AC

## 2018-07-21 MED ORDER — FAMOTIDINE IN NACL 20-0.9 MG/50ML-% IV SOLN
20.0000 mg | Freq: Once | INTRAVENOUS | Status: AC
  Administered 2018-07-21: 20 mg via INTRAVENOUS
  Filled 2018-07-21: qty 50

## 2018-07-21 MED ORDER — HYDROCORTISONE (PERIANAL) 2.5 % EX CREA
TOPICAL_CREAM | Freq: Two times a day (BID) | CUTANEOUS | Status: DC
  Filled 2018-07-21: qty 28.35

## 2018-07-21 MED ORDER — HYDROCORTISONE (PERIANAL) 2.5 % EX CREA: TOPICAL_CREAM | Freq: Two times a day (BID) | CUTANEOUS | 0 refills | Status: DC

## 2018-07-21 MED ORDER — LORAZEPAM 2 MG/ML PO CONC: 0.5000 mg | ORAL | 0 refills | Status: DC | PRN

## 2018-07-21 MED ORDER — MORPHINE SULFATE (CONCENTRATE) 10 MG /0.5 ML PO SOLN: 10.0000 mg | ORAL | 0 refills | Status: DC | PRN

## 2018-07-21 MED ORDER — FERROUS SULFATE 325 (65 FE) MG PO TABS: 325.0000 mg | ORAL_TABLET | Freq: Every day | ORAL | 0 refills | Status: DC

## 2018-07-21 MED ORDER — HALOPERIDOL LACTATE 2 MG/ML PO CONC: 1.0000 mg | Freq: Four times a day (QID) | ORAL | 0 refills | Status: DC | PRN

## 2018-07-21 NOTE — Progress Notes (Signed)
Daily Progress Note   Patient Name: [name]       Date: 07/21/2018 DOB: November 09, 1965  Age: 53 y.o. MRN#: [id number] Attending Physician: Shelly Coss, MD Primary Care Physician: [name], MD Admit Date: 07/19/2018  Reason for Consultation/Follow-up: Establishing goals of care, Hospice Evaluation, Non pain symptom management and Pain control  Subjective: I met again with Mr. Sturges this AM.  He report having "OK" night.  Denies any needs and reports pain is currently well controlled.    Discussed again regarding his wishes and plan for initiation of home hospice services.  Discussed plan for transition of comfort meds to oral concentrate to ensure he is able to get them down.  Reports these worked well for him when he was on hospice services in the past.  Called and discussed with liaison for [facility] as well as [name].  Length of Stay: 2  Current Medications: Scheduled Meds:  . hydrocortisone   Rectal BID  . methylPREDNISolone (SOLU-MEDROL) injection  10 mg Intravenous Daily    Continuous Infusions:   PRN Meds: acetaminophen **OR** acetaminophen, clonazePAM, HYDROcodone-acetaminophen, iohexol, LORazepam, morphine injection  Physical Exam     General: Alert, awake, in no acute distress.  Frail and chronically ill appearing.  HEENT: No bruits, no goiter, no JVD Heart: Regular rate and rhythm. No murmur appreciated. Lungs: Good air movement, clear Abdomen: Soft, nontender, nondistended, positive bowel sounds.  Ext: No significant edema Skin: Warm and dry Neuro: Grossly intact, nonfocal.      Vital Signs: BP (!) 130/91 (BP Location: Left Arm)   Pulse 76   Temp 98.1 F (36.7 C) (Oral)   Resp 18   Ht 6' 2"  (1.88 m)   Wt 70.3 kg   SpO2 100%   BMI  19.90 kg/m  SpO2: SpO2: 100 % O2 Device: O2 Device: Room Air O2 Flow Rate:    Intake/output summary:   Intake/Output Summary (Last 24 hours) at 07/21/2018 1350 Last data filed at 07/21/2018 1239 Gross per 24 hour  Intake 2312.05 ml  Output 1307 ml  Net 1005.05 ml   LBM: Last BM Date: 07/21/18 Baseline Weight: Weight: 70.3 kg Most recent weight: Weight: 70.3 kg       Palliative Assessment/Data:    Flowsheet Rows     Most Recent Value  Intake  Tab  Referral Department  Hospitalist  Unit at Time of Referral  ER  Palliative Care Primary Diagnosis  Other (Comment) [GI]  Date Notified  07/19/18  Palliative Care Type  Return patient Palliative Care  Reason for referral  Clarify Goals of Care  Date of Admission  07/19/18  Date first seen by Palliative Care  07/20/18  # of days Palliative referral response time  1 Day(s)  # of days IP prior to Palliative referral  0  Clinical Assessment  Palliative Performance Scale Score  40%  Psychosocial & Spiritual Assessment  Palliative Care Outcomes  Patient/Family meeting held?  Yes  Who was at the meeting?  Patient      Patient Active Problem List   Diagnosis Date Noted  . Anxiety and depression   . Abdominal pain 07/19/2018  . Partial small bowel obstruction ([place]) 07/19/2018  . Avoidant-restrictive food intake disorder (ARFID) ? 06/15/2018  . Dilated intrahepatic bile ducts   . Vitamin C deficiency 05/23/2018  . Vitamin A deficiency 05/23/2018  . Loss of weight 04/07/2018  . Grover's disease 11/04/2017  . Chronic pain syndrome 07/09/2016  . Perirectal fistula 05/18/2016  . Portal vein thrombosis 03/28/2015  . Insomnia 11/29/2014  . Long-term use of immunosuppressant medication - Entyvio 11/28/2014  . Posterior Anal fissure 10/03/2014  . Anemia due to chronic blood loss 02/11/2014  . 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 Rash on trunk 12/31/2012  . Arthralgia 08/28/2012  . Major depressive disorder, recurrent  episode, severe ([place]) 04/12/2012  . Personal history of adenomatous colonic polyps 01/18/2012  . Osteopenia 02/17/2011  . Long term current use of systemic steroids 08/11/2010  . Essential hypertension 08/09/2008  . VITAMIN D DEFICIENCY 09/26/2007  . HYPERLIPIDEMIA, SEVERE 09/26/2007  . CROHN'S DISEASE, LARGE AND SMALL INTESTINES 03/28/2007    Palliative Care Assessment & Plan   Patient Profile: 53 y.o. male  with past medical history of Crohns disease, autoimmune hepatitis, depression admitted on 07/19/2018 with worsening abdominal pain, diarrhea, and nausea.  He was last seen by our team several years ago and transitioned home with hospice support.  He then improved and revoked his hospice benefits.  Noted to be reporting that he no longer desires treatment and wants to transition home with hospice.   Assessment: Patient Active Problem List   Diagnosis Date Noted  . Anxiety and depression   . Abdominal pain 07/19/2018  . Partial small bowel obstruction ([place]) 07/19/2018  . Avoidant-restrictive food intake disorder (ARFID) ? 06/15/2018  . Dilated intrahepatic bile ducts   . Vitamin C deficiency 05/23/2018  . Vitamin A deficiency 05/23/2018  . Loss of weight 04/07/2018  . Grover's disease 11/04/2017  . Chronic pain syndrome 07/09/2016  . Perirectal fistula 05/18/2016  . Portal vein thrombosis 03/28/2015  . Insomnia 11/29/2014  . Long-term use of immunosuppressant medication - Entyvio 11/28/2014  . Posterior Anal fissure 10/03/2014  . Anemia due to chronic blood loss 02/11/2014  . 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 Rash on trunk 12/31/2012  . Arthralgia 08/28/2012  . Major depressive disorder, recurrent episode, severe ([place]) 04/12/2012  . Personal history of adenomatous colonic polyps 01/18/2012  . Osteopenia 02/17/2011  . Long term current use of systemic steroids 08/11/2010  . Essential hypertension 08/09/2008  . VITAMIN D DEFICIENCY 09/26/2007  . HYPERLIPIDEMIA,  SEVERE 09/26/2007  . CROHN'S DISEASE, LARGE AND SMALL INTESTINES 03/28/2007     Recommendations/Plan:  Plan for transition home with hospice services.   Scripts written for comfort meds  on discharge.  Further refills per hospice agency.    Morphine Intensol 781m/ml; 150m(1/81m35mQ2hr sublingual prn pain or shortness of breath; Dispense 52m681m d/c.  Ativan Intensol 81mg/34m give 0.5mg Q71msublingual prn anxitey; Dispense 52mL o4mc.  Haldol Intensol 81mg/ml;67mve 0.5mg Q2H 69mlingual prn agitation or nausea; Dispense 52mL on d97m  Goals of Care and Additional Recommendations:  Limitations on Scope of Treatment: Full Comfort Care  Code Status:    Code Status Orders  (From admission, onward)         Start     Ordered   07/19/18 2046  Do not attempt resuscitation (DNR)  Continuous    Question Answer Comment  In the event of cardiac or respiratory ARREST Do not call a "code blue"   In the event of cardiac or respiratory ARREST Do not perform Intubation, CPR, defibrillation or ACLS   In the event of cardiac or respiratory ARREST Use medication by any route, position, wound care, and other measures to relive pain and suffering. May use oxygen, suction and manual treatment of airway obstruction as needed for comfort.      07/19/18 2045        Code Status History    Date Active Date Inactive Code Status Order ID Comments User Context   05/28/2018 1506 06/01/2018 1222 DNR [id number] Bongoient   05/20/2012 1059 05/25/2012 1606 DNR [id number]  [id number] Royaltyient   05/18/2012 1723 05/20/2012 1059 Full Code [id number]  [id number] Girtient       Prognosis:   < 6 months as he has elected to forgo further treatment for his Crohn's, AIH-PSC  Discharge Planning:  Home with Hospice  Care plan was discussed with patient, Dr. Gessner, ACarlean Purlare liaison, Dr. Adhikari  Tawanna Solou for allowing the Palliative Medicine Team to assist in the care of this  patient.   Time In: 0930 Time Out: 1020 Total Time 50 Prolonged Time Billed No      Greater than 50%  of this time was spent counseling and coordinating care related to the above assessment and plan.  Emmajane Altamura FreemMicheline Roughse contact Palliative Medicine Team phone at [phone] [phone] and concerns.

## 2018-07-21 NOTE — Progress Notes (Signed)
Patient has discharged to home on 07/21/2018. Discharge instruction including medication and appointment was given to patient. CM is called for hospice at home order. No question at this time.

## 2018-07-21 NOTE — Discharge Summary (Signed)
Physician Discharge Summary  [name] YKD:[id number] DOB: 12-Nov-1965 DOA: 07/19/2018  PCP: [name], MD  Admit date: 07/19/2018 Discharge date: 07/21/2018  Admitted From: Home Disposition:  Home  Discharge Condition:Stable CODE STATUS: DNR Diet recommendation: Soft Brief/Interim Summary: Patient is a 53 year old male with history of Crohn's disease, abdominal pruritus, depression, chronic pain syndrome who presented to emergency department from home with complaints of progressive abdominal discomfort, nausea, vomiting for last 2 weeks.  He follows with gastroenterology for the treatment of his Crohn's disease.  Patient was very frustrated with his ongoing medical issues, was very emotional and severely depressed on presentation.    Patient also requested for Palliative care consultation. Abdominal x-ray done on presentation was also was suspicious for partial small bowel obstruction.  CT abdomen was done which did not show any obstruction but just showed colitis.  Patient was started on IV steroids.  GI was following.  There was concern for severe depression so psychiatry was consulted.  Patient denied to be started on any medication.  He wished for hospice evaluation. This morning he is hemodynamically stable.  After discussion with GI, we decided to discharge him home with hospice today.  Following problems were addressed during his hospitalization:  Crohn's disease: Has history of Crohn's disease and follows with GI.  Currently on outpatient therapy with daily prednisone and vendolizumab.  GI following.  He gives history of weight loss. CT abdomen showed colitis but no obstruction or abscess.  He was initially started on IV steroids.  After discussing with GI, he will be discharged today to home with 5 mg of prednisone.  Follow-up with GI as an outpatient.  History of autoimmune hepatitis/primary sclerosing colitis: On steroids.  GI following.  Elevated alkaline  phosphatase.  Major depressive disorder: Appears severely depressed.  He says he does not want to live with his misery.He  denies any suicidal ideations or thoughts.  Psychiatry consulted.  She denied any medications.  He has capacity to make his decisions.  Goals of care discussion/multiple comorbidities: Patient has requested for palliative care evaluation.  As per the GI note, he was also following with hospice few years ago.  Hospice will be arranged on discharge.  Case manager consulted.  Iron deficiency anemia/folic acid deficiency: Started on supplementation  Discharge Diagnoses:  Principal Problem:   Anxiety and depression Active Problems:   CROHN'S DISEASE, LARGE AND SMALL INTESTINES   Long term current use of systemic steroids   Major depressive disorder, recurrent episode, severe ([name])   Long-term use of immunosuppressant medication - Entyvio   Loss of weight   Abdominal pain   Partial small bowel obstruction [facility])    Discharge Instructions  Discharge Instructions    Diet general   Complete by:  As directed    Soft   Discharge instructions   Complete by:  As directed    1)Follow up with your PCP and gastroenterologist as an outpatient. 2) take prescribed medications as instructed. 3)Follow up with Home Hospice.   Increase activity slowly   Complete by:  As directed      Allergies as of 07/21/2018      Reactions   Asacol [mesalamine] Diarrhea   abd pain   Flagyl [metronidazole] Diarrhea   abd pain   Azathioprine Diarrhea   abd pain   Gluten Meal Diarrhea   Mycophenolate Mofetil Diarrhea   abd pain   Other    NUTS; constipation   Wheat Bran Diarrhea   Constipation; flatulence; abd  pain   Tape Itching, Rash   Please use "paper" tape      Medication List    STOP taking these medications   clonazePAM 0.5 MG tablet Commonly known as:  KLONOPIN   HYDROcodone-acetaminophen 5-325 MG tablet Commonly known as:  NORCO/VICODIN     TAKE these  medications   AMBULATORY NON FORMULARY MEDICATION Dilitazem 2% cream  Apply pea size amount to rectum twice daily   dicyclomine 20 MG tablet Commonly known as:  BENTYL Take 1 tablet (20 mg total) by mouth 4 (four) times daily -  before meals and at bedtime.   ferrous sulfate 325 (65 FE) MG tablet Take 1 tablet (325 mg total) by mouth daily for 30 days.   folic acid 1 MG tablet Commonly known as:  FOLVITE Take 1 tablet (1 mg total) by mouth daily for 30 days.   haloperidol 2 MG/ML solution Commonly known as:  HALDOL Take 0.5 mLs (1 mg total) by mouth every 6 (six) hours as needed for agitation (or nausea). Hospice patient Terminal diagnosis: Crohn's disease/Primary Sclerosing Cholangitis/Autoimmune Hepatitis   hydrocortisone 2.5 % rectal cream Commonly known as:  ANUSOL-HC Place rectally 2 (two) times daily.   loperamide 2 MG capsule Commonly known as:  IMODIUM Take 1 capsule (2 mg total) by mouth 2 (two) times daily.   LORazepam 2 MG/ML concentrated solution Commonly known as:  ATIVAN Take 0.3 mLs (0.6 mg total) by mouth every 4 (four) hours as needed for anxiety.   morphine CONCENTRATE 10 mg / 0.5 ml concentrated solution Take 0.5 mLs (10 mg total) by mouth every 2 (two) hours as needed for severe pain or shortness of breath.   ondansetron 4 MG tablet Commonly known as:  ZOFRAN Take 1 tablet (4 mg total) by mouth every 6 (six) hours as needed for nausea.   predniSONE 5 MG tablet Commonly known as:  DELTASONE Take 5 mg by mouth daily with breakfast.      Follow-up Information    [name], MD. Schedule an appointment as soon as possible for a visit in 1 week(s).   Specialty:  Family Medicine Contact information: [address] [place] [address] [phone]          Allergies  Allergen Reactions  . Asacol [Mesalamine] Diarrhea    abd pain  . Flagyl [Metronidazole] Diarrhea    abd pain  . Azathioprine Diarrhea    abd pain  . Gluten  Meal Diarrhea  . Mycophenolate Mofetil Diarrhea    abd pain  . Other     NUTS; constipation  . Wheat Bran Diarrhea    Constipation; flatulence; abd pain  . Tape Itching and Rash    Please use "paper" tape    Consultations:  GI, palliative care, psychiatry   Procedures/Studies: Ct Abdomen Pelvis W Contrast  Result Date: 07/20/2018 CLINICAL DATA:  Abdominal pain for several days EXAM: CT ABDOMEN AND PELVIS WITH CONTRAST TECHNIQUE: Multidetector CT imaging of the abdomen and pelvis was performed using the standard protocol following bolus administration of intravenous contrast. CONTRAST:  16m OMNIPAQUE IOHEXOL 300 MG/ML  SOLN COMPARISON:  04/26/2018 FINDINGS: Lower chest: No acute abnormality. Hepatobiliary: Gallbladder is within normal limits. The liver again demonstrates an area of stable distortion within the right lobe posteriorly. This is been stable dating back to 2017 related to prior portal vein occlusion on the right. Stable ductal dilatation in this region is noted when compare with the prior exam. Mild ductal dilatation in the  left lobe medially is noted and stable in appearance. No definitive new focal mass is seen. Pancreas: Unremarkable. No pancreatic ductal dilatation or surrounding inflammatory changes. Spleen: Normal in size without focal abnormality. Adrenals/Urinary Tract: Adrenal glands are within normal limits bilaterally. No renal calculi or obstructive changes are seen. Normal enhancement and excretion of the kidneys is noted. The bladder is partially distended. Stomach/Bowel: The appendix is within normal limits inferior to the cecum. There is some mild wall thickening noted in the descending colon with mild pericolonic inflammatory changes. This may represent some early focal colitis. No perforation or abscess formation is seen. The stomach is within normal limits. No small bowel abnormality is noted. Vascular/Lymphatic: Aortic atherosclerosis. No enlarged abdominal or  pelvic lymph nodes. Reproductive: Prostate is unremarkable. Other: No abdominal wall hernia or abnormality. No abdominopelvic ascites. Musculoskeletal: No acute or significant osseous findings. IMPRESSION: Mild wall thickening in the descending colon with pericolonic inflammatory change consistent with early colitis. Chronic changes within the liver related to prior portal vein thrombosis on the right. This is stable from the prior exam and dating back to 2017. Resolution of previously seen fullness in the region of the cecum. Electronically Signed   By: Inez Catalina M.D.   On: 07/20/2018 12:14   Dg Abdomen Acute W/chest  Result Date: 07/19/2018 CLINICAL DATA:  Abdominal pain with nausea and diarrhea. History of Crohn's disease EXAM: DG ABDOMEN ACUTE W/ 1V CHEST COMPARISON:  Chest radiograph September 05, 2015; CT abdomen and pelvis April 26, 2018 FINDINGS: PA chest: No edema or consolidation. Heart size and pulmonary vascularity are normal. No adenopathy. Supine and upright abdomen: There are several loops of borderline dilated small bowel with scattered air-fluid levels. No evident free air. There is air in the colon and rectum. There is a probable phlebolith in the lower left pelvis. IMPRESSION: Scattered air-fluid levels with several loops of borderline dilated small bowel. Suspect a degree of enteritis or ileus. The earliest changes of partial small-bowel obstruction cannot be excluded. Note that there is air in the colon and rectum. No free air. Lungs clear. Electronically Signed   By: Lowella Grip III M.D.   On: 07/19/2018 15:40      Subjective: Patient seen and examined the bedside this morning.  Remains comfortable.  Hemodynamically stable.  Denies any abdominal pain.  Having bowel movement.  No nausea or vomiting.  Stable for discharge today. Discharge Exam: Vitals:   07/20/18 2017 07/21/18 0523  BP: 118/71 137/83  Pulse: 76 74  Resp: 19 18  Temp: 98.2 F (36.8 C) 98.1 F (36.7 C)   SpO2: 97% 99%   Vitals:   07/20/18 0551 07/20/18 1507 07/20/18 2017 07/21/18 0523  BP: 125/77 130/82 118/71 137/83  Pulse: 79 80 76 74  Resp: 15  19 18   Temp: 98.2 F (36.8 C) 97.8 F (36.6 C) 98.2 F (36.8 C) 98.1 F (36.7 C)  TempSrc:   Oral Oral  SpO2: 99% 99% 97% 99%  Weight:      Height:        General: Pt is alert, awake, not in acute distress Cardiovascular: RRR, S1/S2 +, no rubs, no gallops Respiratory: CTA bilaterally, no wheezing, no rhonchi Abdominal: Soft, NT, ND, bowel sounds + Extremities: no edema, no cyanosis    The results of significant diagnostics from this hospitalization (including imaging, microbiology, ancillary and laboratory) are listed below for reference.     Microbiology: Recent Results (from the past 240 hour(s))  SARS Coronavirus 2 (CEPHEID -  Performed in [facility] hospital lab), Hosp Order     Status: None   Collection Time: 07/19/18  2:17 PM  Result Value Ref Range Status   SARS Coronavirus 2 NEGATIVE NEGATIVE Final    Comment: (NOTE) If result is NEGATIVE SARS-CoV-2 target nucleic acids are NOT DETECTED. The SARS-CoV-2 RNA is generally detectable in upper and lower  respiratory specimens during the acute phase of infection. The lowest  concentration of SARS-CoV-2 viral copies this assay can detect is 250  copies / mL. A negative result does not preclude SARS-CoV-2 infection  and should not be used as the sole basis for treatment or other  patient management decisions.  A negative result may occur with  improper specimen collection / handling, submission of specimen other  than nasopharyngeal swab, presence of viral mutation(s) within the  areas targeted by this assay, and inadequate number of viral copies  (<250 copies / mL). A negative result must be combined with clinical  observations, patient history, and epidemiological information. If result is POSITIVE SARS-CoV-2 target nucleic acids are DETECTED. The SARS-CoV-2 RNA is  generally detectable in upper and lower  respiratory specimens dur ing the acute phase of infection.  Positive  results are indicative of active infection with SARS-CoV-2.  Clinical  correlation with patient history and other diagnostic information is  necessary to determine patient infection status.  Positive results do  not rule out bacterial infection or co-infection with other viruses. If result is PRESUMPTIVE POSTIVE SARS-CoV-2 nucleic acids MAY BE PRESENT.   A presumptive positive result was obtained on the submitted specimen  and confirmed on repeat testing.  While 2019 novel coronavirus  (SARS-CoV-2) nucleic acids may be present in the submitted sample  additional confirmatory testing may be necessary for epidemiological  and / or clinical management purposes  to differentiate between  SARS-CoV-2 and other Sarbecovirus currently known to infect humans.  If clinically indicated additional testing with an alternate test  methodology [phone]) is advised. The SARS-CoV-2 RNA is generally  detectable in upper and lower respiratory sp ecimens during the acute  phase of infection. The expected result is Negative. Fact Sheet for Patients:  StrictlyIdeas.no Fact Sheet for Healthcare Providers: BankingDealers.co.za This test is not yet approved or cleared by the [place] FDA and has been authorized for detection and/or diagnosis of SARS-CoV-2 by FDA under an Emergency Use Authorization (EUA).  This EUA will remain in effect (meaning this test can be used) for the duration of the COVID-19 declaration under Section 564(b)(1) of the Act, 21 U.S.C. section 360bbb-3(b)(1), unless the authorization is terminated or revoked sooner. Performed at [facility], [place] [address] [place], [place] [address]      Labs: BNP (last 3 results) No results for input(s): BNP in the last 8760 hours. Basic Metabolic Panel: Recent  Labs  Lab 07/19/18 1358 07/19/18 2124 07/20/18 0623  NA 139  --  139  K 3.4*  --  3.6  CL 99  --  108  CO2 31  --  26  GLUCOSE 91  --  59*  BUN 8  --  6  CREATININE 0.89 0.82 0.89  CALCIUM 8.3*  --  7.9*   Liver Function Tests: Recent Labs  Lab 07/19/18 1358  AST 22  ALT 18  ALKPHOS 293*  BILITOT 0.5  PROT 6.0*  ALBUMIN 2.9*   Recent Labs  Lab 07/19/18 1358  LIPASE 24   No results for input(s): AMMONIA in the last 168 hours.  CBC: Recent Labs  Lab 07/19/18 1358 07/19/18 2124 07/20/18 0623  WBC 8.3 4.2 5.9  HGB 13.2 11.5* 11.0*  HCT 41.8 37.3* 36.3*  MCV 88.4 89.2 90.1  PLT 280 236 235   Cardiac Enzymes: No results for input(s): CKTOTAL, CKMB, CKMBINDEX, TROPONINI in the last 168 hours. BNP: Invalid input(s): POCBNP CBG: No results for input(s): GLUCAP in the last 168 hours. D-Dimer No results for input(s): DDIMER in the last 72 hours. Hgb A1c No results for input(s): HGBA1C in the last 72 hours. Lipid Profile No results for input(s): CHOL, HDL, LDLCALC, TRIG, CHOLHDL, LDLDIRECT in the last 72 hours. Thyroid function studies Recent Labs    07/21/18 0826  TSH 2.053   Anemia work up Recent Labs    07/21/18 0826  VITAMINB12 492  FOLATE 2.9*  FERRITIN 106  TIBC 142*  IRON 15*   Urinalysis    Component Value Date/Time   COLORURINE YELLOW 07/19/2018 1414   APPEARANCEUR CLEAR 07/19/2018 1414   LABSPEC 1.005 07/19/2018 1414   PHURINE 8.0 07/19/2018 1414   GLUCOSEU NEGATIVE 07/19/2018 1414   HGBUR NEGATIVE 07/19/2018 1414   BILIRUBINUR NEGATIVE 07/19/2018 1414   KETONESUR NEGATIVE 07/19/2018 1414   PROTEINUR NEGATIVE 07/19/2018 1414   UROBILINOGEN 0.2 03/26/2012 0123   NITRITE NEGATIVE 07/19/2018 1414   LEUKOCYTESUR NEGATIVE 07/19/2018 1414   Sepsis Labs Invalid input(s): PROCALCITONIN,  WBC,  LACTICIDVEN Microbiology Recent Results (from the past 240 hour(s))  SARS Coronavirus 2 (CEPHEID - Performed in [facility] hospital lab), Hosp  Order     Status: None   Collection Time: 07/19/18  2:17 PM  Result Value Ref Range Status   SARS Coronavirus 2 NEGATIVE NEGATIVE Final    Comment: (NOTE) If result is NEGATIVE SARS-CoV-2 target nucleic acids are NOT DETECTED. The SARS-CoV-2 RNA is generally detectable in upper and lower  respiratory specimens during the acute phase of infection. The lowest  concentration of SARS-CoV-2 viral copies this assay can detect is 250  copies / mL. A negative result does not preclude SARS-CoV-2 infection  and should not be used as the sole basis for treatment or other  patient management decisions.  A negative result may occur with  improper specimen collection / handling, submission of specimen other  than nasopharyngeal swab, presence of viral mutation(s) within the  areas targeted by this assay, and inadequate number of viral copies  (<250 copies / mL). A negative result must be combined with clinical  observations, patient history, and epidemiological information. If result is POSITIVE SARS-CoV-2 target nucleic acids are DETECTED. The SARS-CoV-2 RNA is generally detectable in upper and lower  respiratory specimens dur ing the acute phase of infection.  Positive  results are indicative of active infection with SARS-CoV-2.  Clinical  correlation with patient history and other diagnostic information is  necessary to determine patient infection status.  Positive results do  not rule out bacterial infection or co-infection with other viruses. If result is PRESUMPTIVE POSTIVE SARS-CoV-2 nucleic acids MAY BE PRESENT.   A presumptive positive result was obtained on the submitted specimen  and confirmed on repeat testing.  While 2019 novel coronavirus  (SARS-CoV-2) nucleic acids may be present in the submitted sample  additional confirmatory testing may be necessary for epidemiological  and / or clinical management purposes  to differentiate between  SARS-CoV-2 and other Sarbecovirus currently  known to infect humans.  If clinically indicated additional testing with an alternate test  methodology [phone]) is advised. The SARS-CoV-2 RNA is generally  detectable in upper and lower respiratory sp ecimens during the acute  phase of infection. The expected result is Negative. Fact Sheet for Patients:  StrictlyIdeas.no Fact Sheet for Healthcare Providers: BankingDealers.co.za This test is not yet approved or cleared by the [place] FDA and has been authorized for detection and/or diagnosis of SARS-CoV-2 by FDA under an Emergency Use Authorization (EUA).  This EUA will remain in effect (meaning this test can be used) for the duration of the COVID-19 declaration under Section 564(b)(1) of the Act, 21 U.S.C. section 360bbb-3(b)(1), unless the authorization is terminated or revoked sooner. Performed at [facility], [place] [address], [place], [place] [address]     Please note: You were cared for by a hospitalist during your hospital stay. Once you are discharged, your primary care physician will handle any further medical issues. Please note that NO REFILLS for any discharge medications will be authorized once you are discharged, as it is imperative that you return to your primary care physician (or establish a relationship with a primary care physician if you do not have one) for your post hospital discharge needs so that they can reassess your need for medications and monitor your lab values.    Time coordinating discharge: 40 minutes  SIGNED:   Shelly Coss, MD  [facility] 07/21/2018, 12:42 PM Pager [phone]  If 7PM-7AM, please contact night-coverage [email or web address] Password [email or web address]

## 2018-07-21 NOTE — Progress Notes (Signed)
Patient c/o of rectal itching due to past hemorrhiods. Was called into room patient has applied hemorrhoid cream from home. MD please talk with patient concerning issues. Need order. Will continue to monitor.

## 2018-07-21 NOTE — Progress Notes (Signed)
[facility]) Hospice  Pt referred to [facility] for hospice services at home once discharged.  Pt and chart under review and eligibility is pending at this time.  Confirmed pt is interested in hospice services.  Wants to go home today, get settled and have [facility] reach out to him tomorrow.  [facility] referral center updated with plan.  No DME needs have been determined.  Thank you for this referral, Venia Carbon RN, BSN, [facility] Liaison (in [place] under Hospice and [facility] [place]) [phone] (main #)

## 2018-07-21 NOTE — Progress Notes (Signed)
CSW spoke with, Anderson Malta, with [facility] regarding disposition- they have received Home Hospice referral and will follow up with patient once at home.   CSW notified RN.   Kingsley Spittle, LCSW [facility] [place]  [phone]

## 2018-07-21 NOTE — Progress Notes (Addendum)
Progress Note    ASSESSMENT AND PLAN:   1. Crohn's disease of the large and small intestine.Treatment has been complicated by severe depression / anxiety. He has not been compliant with treatment in months.  Admitted with failure to thrive/weight loss and mid lower abdominal pain. CT scan yesterday >>> mild wall thickening / inflammatory changes involving descending colon -Appreciate Psychiatry and Palliative Care assistance. Refusing meds so Psychiatry has signed off case.  -Wants to go home today with Hospice Care.  -Pain control going forward per Hospice Care. Benefit from Fentanyl patch as he has an adversity to oral meds. -AFP, TSH, IgA, TTg, fecal calprotectin and multiple labs to assess nutritional status are all still pending.  -Diet as tolerated -He wants to be off prednisone, says it makes abdominal pain and bleeding worse. He had already tapered himself down to 5 mg at home so should be okay to discontinue without concern for adrenal issues. -Will touch base with primary GI [name] to arrange for televisit in a couple of weeks.   2.  Autoimmune hepatitis with PSC overlap.   New elevation in Alk phos . CTAP yesterday >>>Chronic changes in right liver (intrahepatic duct dilation). New elevation in Alk phos  3.  Abnormal rectal exam yesterday >> perirectal fullness / tenderness without overt signs for abscess. Pelvic MRI was ordered for further assessment but I am skeptical he will have it done.  Complaints of "hemorrhoid pain" last night. Though pain may be more than just hemorroids we may never know at this point.  -will treat empirically with Anusol cream PR.  4. Severe depression.  History of hospice care a few years back due to loss of interest in continuing medical care / treatments.  He improved, came out of hospice and did fairly well over the last few years.  Palliative Care saw him yesterday. Patient want to go home with Hospice care   SUBJECTIVE     Feels  about the same. Complains of gas. Had some hemorrhoidal related discomfort last night.    OBJECTIVE:     Vital signs in last 24 hours: Temp:  [97.8 F (36.6 C)-98.2 F (36.8 C)] 98.1 F (36.7 C) (05/22 0523) Pulse Rate:  [74-80] 74 (05/22 0523) Resp:  [18-19] 18 (05/22 0523) BP: (118-137)/(71-83) 137/83 (05/22 0523) SpO2:  [97 %-99 %] 99 % (05/22 0523) Last BM Date: 07/21/18 General:   Alert, thin male in NAD EENT:  Normal hearing, non icteric sclera, conjunctive pink.  Abdomen:  Soft, nondistended, nontender.  Normal bowel sounds,.       Neurologic:  Alert and  oriented x4;  grossly normal neurologically. Psych:  Pleasant, cooperative.  Normal mood and affect.   Intake/Output from previous day: 05/21 0701 - 05/22 0700 In: 1244.4 [I.V.:1244.4] Out: 1950 [Urine:1950] Intake/Output this shift: Total I/O In: 347.7 [I.V.:347.7] Out: -   Lab Results: Recent Labs    07/19/18 1358 07/19/18 2124 07/20/18 0623  WBC 8.3 4.2 5.9  HGB 13.2 11.5* 11.0*  HCT 41.8 37.3* 36.3*  PLT 280 236 235   BMET Recent Labs    07/19/18 1358 07/19/18 2124 07/20/18 0623  NA 139  --  139  K 3.4*  --  3.6  CL 99  --  108  CO2 31  --  26  GLUCOSE 91  --  59*  BUN 8  --  6  CREATININE 0.89 0.82 0.89  CALCIUM 8.3*  --  7.9*   LFT Recent Labs  07/19/18 1358  PROT 6.0*  ALBUMIN 2.9*  AST 22  ALT 18  ALKPHOS 293*  BILITOT 0.5   PCt Abdomen Pelvis W Contrast  Result Date: 07/20/2018 CLINICAL DATA:  Abdominal pain for several days EXAM: CT ABDOMEN AND PELVIS WITH CONTRAST TECHNIQUE: Multidetector CT imaging of the abdomen and pelvis was performed using the standard protocol following bolus administration of intravenous contrast. CONTRAST:  165m OMNIPAQUE IOHEXOL 300 MG/ML  SOLN COMPARISON:  04/26/2018 FINDINGS: Lower chest: No acute abnormality. Hepatobiliary: Gallbladder is within normal limits. The liver again demonstrates an area of stable distortion within the right lobe  posteriorly. This is been stable dating back to 2017 related to prior portal vein occlusion on the right. Stable ductal dilatation in this region is noted when compare with the prior exam. Mild ductal dilatation in the left lobe medially is noted and stable in appearance. No definitive new focal mass is seen. Pancreas: Unremarkable. No pancreatic ductal dilatation or surrounding inflammatory changes. Spleen: Normal in size without focal abnormality. Adrenals/Urinary Tract: Adrenal glands are within normal limits bilaterally. No renal calculi or obstructive changes are seen. Normal enhancement and excretion of the kidneys is noted. The bladder is partially distended. Stomach/Bowel: The appendix is within normal limits inferior to the cecum. There is some mild wall thickening noted in the descending colon with mild pericolonic inflammatory changes. This may represent some early focal colitis. No perforation or abscess formation is seen. The stomach is within normal limits. No small bowel abnormality is noted. Vascular/Lymphatic: Aortic atherosclerosis. No enlarged abdominal or pelvic lymph nodes. Reproductive: Prostate is unremarkable. Other: No abdominal wall hernia or abnormality. No abdominopelvic ascites. Musculoskeletal: No acute or significant osseous findings. IMPRESSION: Mild wall thickening in the descending colon with pericolonic inflammatory change consistent with early colitis. Chronic changes within the liver related to prior portal vein thrombosis on the right. This is stable from the prior exam and dating back to 2017. Resolution of previously seen fullness in the region of the cecum. Electronically Signed   By: MInez CatalinaM.D.   On: 07/20/2018 12:14   Dg Abdomen Acute W/chest  Result Date: 07/19/2018 CLINICAL DATA:  Abdominal pain with nausea and diarrhea. History of Crohn's disease EXAM: DG ABDOMEN ACUTE W/ 1V CHEST COMPARISON:  Chest radiograph September 05, 2015; CT abdomen and pelvis April 26, 2018 FINDINGS: PA chest: No edema or consolidation. Heart size and pulmonary vascularity are normal. No adenopathy. Supine and upright abdomen: There are several loops of borderline dilated small bowel with scattered air-fluid levels. No evident free air. There is air in the colon and rectum. There is a probable phlebolith in the lower left pelvis. IMPRESSION: Scattered air-fluid levels with several loops of borderline dilated small bowel. Suspect a degree of enteritis or ileus. The earliest changes of partial small-bowel obstruction cannot be excluded. Note that there is air in the colon and rectum. No free air. Lungs clear. Electronically Signed   By: WLowella GripIII M.D.   On: 07/19/2018 15:40      Principal Problem:   Anxiety and depression Active Problems:   CROHN'S DISEASE, LARGE AND SMALL INTESTINES   Long term current use of systemic steroids   Major depressive disorder, recurrent episode, severe ([name])   Long-term use of immunosuppressant medication - Entyvio   Loss of weight   Abdominal pain   Partial small bowel obstruction ([place]     LOS: 2 days   PTye Savoy,NP 07/21/2018, 8:51 AM   Attending physician's  note   I have taken an interval history, reviewed the chart and examined the patient. I agree with the Advanced Practitioner's note, impression and recommendations.   Difficult situation.  53 year old patient with Crohn's disease, AIH with PSC, severe anxiety/depression who wants hospice care.  He is competent, A&O x 3  Refusing all care/radiologic investigations.  Plan: -Use fentanyl patch for chronic abdominal pain. -Appreciate psychiatry consultation and palliative care consultation. -Can discharge him home today on prednisone 5 mg p.o. QD (he had tapered himself down and is opposed to taking any higher dose).  I do agree. -Can use Preparation H/Anusol cream for hemorrhoids on PRN basis. -He has all the contact numbers and knows how to get in touch with [place] in  case of any problems/or if he decides to have any treatment. -We will sign off for now. -FU with [name] if he desires.  Can do tele-visit.  Carmell Austria, MD

## 2018-07-22 LAB — AFP TUMOR MARKER: AFP, Serum, Tumor Marker: 1.9 ng/mL (ref 0.0–8.3)

## 2018-07-22 LAB — VITAMIN D 25 HYDROXY (VIT D DEFICIENCY, FRACTURES): Vit D, 25-Hydroxy: 47.5 ng/mL (ref 30.0–100.0)

## 2018-07-22 LAB — IGA: IgA: 75 mg/dL — ABNORMAL LOW (ref 90–386)

## 2018-07-22 LAB — CANCER ANTIGEN 19-9: CA 19-9: 20 U/mL (ref 0–35)

## 2018-07-23 LAB — VITAMIN E
Vitamin E (Alpha Tocopherol): 10.8 mg/L (ref 7.0–25.1)
Vitamin E(Gamma Tocopherol): 0.4 mg/L — ABNORMAL LOW (ref 0.5–5.5)

## 2018-07-23 LAB — COPPER, SERUM: Copper: 118 ug/dL (ref 72–166)

## 2018-07-23 LAB — ZINC: Zinc: 67 ug/dL (ref 56–134)

## 2018-07-23 LAB — VITAMIN A: Vitamin A (Retinoic Acid): 10.7 ug/dL — ABNORMAL LOW (ref 20.1–62.0)

## 2018-07-24 LAB — CALPROTECTIN, FECAL: Calprotectin, Fecal: 3382 ug/g — ABNORMAL HIGH (ref 0–120)

## 2018-07-25 ENCOUNTER — Other Ambulatory Visit: Payer: Self-pay | Admitting: Internal Medicine

## 2018-07-25 LAB — VITAMIN C: Vitamin C: <0.1 mg/dL — ABNORMAL LOW (ref 0.4–2.0)

## 2018-07-25 MED ORDER — HYDROCODONE-ACETAMINOPHEN 5-325 MG PO TABS: 1.0000 | ORAL_TABLET | Freq: Four times a day (QID) | ORAL | 0 refills | Status: DC | PRN

## 2018-07-26 DIAGNOSIS — E46 Unspecified protein-calorie malnutrition: Secondary | ICD-10-CM | POA: Diagnosis not present

## 2018-07-26 DIAGNOSIS — M791 Myalgia, unspecified site: Secondary | ICD-10-CM | POA: Diagnosis not present

## 2018-07-26 DIAGNOSIS — F411 Generalized anxiety disorder: Secondary | ICD-10-CM | POA: Diagnosis not present

## 2018-07-26 LAB — VITAMIN B6: Vitamin B6: 1.7 ug/L — ABNORMAL LOW (ref 5.3–46.7)

## 2018-07-26 LAB — TISSUE TRANSGLUTAMINASE, IGA: Tissue Transglutaminase Ab, IgA: <2 U/mL (ref 0–3)

## 2018-07-27 ENCOUNTER — Encounter: Payer: Self-pay | Admitting: Internal Medicine

## 2018-07-27 ENCOUNTER — Telehealth: Payer: Self-pay | Admitting: Internal Medicine

## 2018-07-27 DIAGNOSIS — E560 Deficiency of vitamin E: Secondary | ICD-10-CM

## 2018-07-27 DIAGNOSIS — E531 Pyridoxine deficiency: Secondary | ICD-10-CM | POA: Insufficient documentation

## 2018-07-27 HISTORY — DX: Pyridoxine deficiency: E53.1

## 2018-07-27 MED ORDER — MIRTAZAPINE 15 MG PO TBDP: 15.0000 mg | ORAL_TABLET | Freq: Every day | ORAL | 1 refills | Status: DC

## 2018-07-27 NOTE — Telephone Encounter (Signed)
I explained that he is deficient in vits A, B6 C and gamma tocopherol E (low NL alpha tocopherol)  He says he cannot take any of these - has tried in the past.  Dr. [name] has sent in a topical pain medication.  [name] is willing to try Remeron Solutab so will rx that - has been ontablet in past

## 2018-07-28 ENCOUNTER — Telehealth: Payer: Self-pay | Admitting: Internal Medicine

## 2018-07-28 ENCOUNTER — Telehealth: Payer: Self-pay

## 2018-07-28 ENCOUNTER — Other Ambulatory Visit: Payer: Self-pay

## 2018-07-28 NOTE — Telephone Encounter (Signed)
-----   Message from [name], MD sent at 07/28/2018  8:28 AM EDT ----- Regarding: [facility] management Can you contact [facility] management and see what if anything [name] is eligible for re: in home care/services  I did talk him into trying dissolvable remeron  Thanks  If I need to talk to somebody happy to

## 2018-07-28 NOTE — Patient Outreach (Signed)
[facility]) Care Management  07/28/2018  [name] 1965/11/17 [id number]   Referral Date: 07/28/2018 Referral Source: MD referral Referral Reason: FTT, malnutrition, and depression.  Is patient eligible for in home care services.     Outreach Attempt: spoke with patient.  He is able to verify HIPAA.  Discussed reason for referral and patient agreeable to speak with CM for screen.  Discussed with patient hospice referral.  He states he declined services as he wanted to try to take steroids to see if this helps him and his crohn's disease.     Social: Patient lives in the home with his parents.  He states that he is independent with his care but his parents assist him with meals.  Patient denies needing assistance with ADL's.     Conditions:Patient admits to crohn's disease, anemia, and depression.  Patient states that when he has a flare with his crohn's that it is difficult for him to eat.  He states that he weighs about 140 lbs and is 6'2".  He states that he has depression and that [name] was working on a referral to a psychiatrist.  Patient PHQ-9=24.  Patient states that he is not taking anything for depression at this time.  Patient deals with pain and is in frequent contact with [name] for management related to his crohn's/pain.     Medications: Patient reports that he takes his medications as prescribed and denies any issues with his medication accept that he cannot tolerate things due to his crohn's disease.     Appointments:  Patient reports talking with his PCP this week and that when he goes to appointments that his father takes him.     Consent: Discussed with patient [facility] services and support. Patient declined telephonic nursing support at this time.  Explained to patient that community services are not available at this time.  Patient agreeable to social work telephonic support for his major depression.  Patient declined pharmacy at this  time.     Plan: RN CM will refer patient to social work for help with his depression.    Telephone call to [facility] at Dr. Celesta Aver office.  Message left concerning referral.  Will wait call back.     [name] Baseman, RN, MSN [facility] Care Management Care Management Coordinator Direct Line [phone] Toll Free: [phone]  Fax: [phone]

## 2018-07-28 NOTE — Telephone Encounter (Signed)
Referral form and notes faxed

## 2018-07-31 ENCOUNTER — Other Ambulatory Visit: Payer: Self-pay

## 2018-07-31 ENCOUNTER — Emergency Department (HOSPITAL_COMMUNITY)
Admission: EM | Admit: 2018-07-31 | Discharge: 2018-08-01 | Disposition: A | Discharge status: Other Acute Inpatient Hospital | Payer: Medicare Other | Attending: Emergency Medicine | Admitting: Emergency Medicine

## 2018-07-31 ENCOUNTER — Emergency Department (HOSPITAL_COMMUNITY): Payer: Medicare Other

## 2018-07-31 ENCOUNTER — Encounter (HOSPITAL_COMMUNITY): Payer: Self-pay | Admitting: Emergency Medicine

## 2018-07-31 DIAGNOSIS — T1491XA Suicide attempt, initial encounter: Secondary | ICD-10-CM

## 2018-07-31 DIAGNOSIS — T543X2A Toxic effect of corrosive alkalis and alkali-like substances, intentional self-harm, initial encounter: Secondary | ICD-10-CM | POA: Diagnosis not present

## 2018-07-31 DIAGNOSIS — R109 Unspecified abdominal pain: Secondary | ICD-10-CM | POA: Diagnosis not present

## 2018-07-31 DIAGNOSIS — I10 Essential (primary) hypertension: Secondary | ICD-10-CM | POA: Diagnosis not present

## 2018-07-31 DIAGNOSIS — R52 Pain, unspecified: Secondary | ICD-10-CM | POA: Diagnosis not present

## 2018-07-31 DIAGNOSIS — K50919 Crohn's disease, unspecified, with unspecified complications: Secondary | ICD-10-CM | POA: Diagnosis not present

## 2018-07-31 DIAGNOSIS — F332 Major depressive disorder, recurrent severe without psychotic features: Secondary | ICD-10-CM | POA: Diagnosis not present

## 2018-07-31 DIAGNOSIS — Z87891 Personal history of nicotine dependence: Secondary | ICD-10-CM | POA: Diagnosis not present

## 2018-07-31 DIAGNOSIS — Z20828 Contact with and (suspected) exposure to other viral communicable diseases: Secondary | ICD-10-CM | POA: Diagnosis not present

## 2018-07-31 DIAGNOSIS — E860 Dehydration: Secondary | ICD-10-CM | POA: Diagnosis not present

## 2018-07-31 DIAGNOSIS — Z79899 Other long term (current) drug therapy: Secondary | ICD-10-CM | POA: Insufficient documentation

## 2018-07-31 DIAGNOSIS — Z03818 Encounter for observation for suspected exposure to other biological agents ruled out: Secondary | ICD-10-CM | POA: Diagnosis not present

## 2018-07-31 DIAGNOSIS — R45851 Suicidal ideations: Secondary | ICD-10-CM | POA: Diagnosis not present

## 2018-07-31 DIAGNOSIS — T50902A Poisoning by unspecified drugs, medicaments and biological substances, intentional self-harm, initial encounter: Secondary | ICD-10-CM | POA: Diagnosis not present

## 2018-07-31 DIAGNOSIS — F322 Major depressive disorder, single episode, severe without psychotic features: Secondary | ICD-10-CM | POA: Diagnosis not present

## 2018-07-31 DIAGNOSIS — R1084 Generalized abdominal pain: Secondary | ICD-10-CM | POA: Diagnosis not present

## 2018-07-31 DIAGNOSIS — Z1159 Encounter for screening for other viral diseases: Secondary | ICD-10-CM | POA: Insufficient documentation

## 2018-07-31 DIAGNOSIS — F29 Unspecified psychosis not due to a substance or known physiological condition: Secondary | ICD-10-CM | POA: Diagnosis not present

## 2018-07-31 DIAGNOSIS — R531 Weakness: Secondary | ICD-10-CM | POA: Diagnosis not present

## 2018-07-31 DIAGNOSIS — T5492XA Toxic effect of unspecified corrosive substance, intentional self-harm, initial encounter: Secondary | ICD-10-CM

## 2018-07-31 HISTORY — PX: PLACEMENT OF SETON: SHX6029

## 2018-07-31 LAB — CBC WITH DIFFERENTIAL/PLATELET
Abs Immature Granulocytes: 0.05 10*3/uL (ref 0.00–0.07)
Basophils Absolute: 0 10*3/uL (ref 0.0–0.1)
Basophils Relative: 0 %
Eosinophils Absolute: 0.1 10*3/uL (ref 0.0–0.5)
Eosinophils Relative: 1 %
HCT: 38.2 % — ABNORMAL LOW (ref 39.0–52.0)
Hemoglobin: 12 g/dL — ABNORMAL LOW (ref 13.0–17.0)
Immature Granulocytes: 1 %
Lymphocytes Relative: 20 %
Lymphs Abs: 1.9 10*3/uL (ref 0.7–4.0)
MCH: 27.5 pg (ref 26.0–34.0)
MCHC: 31.4 g/dL (ref 30.0–36.0)
MCV: 87.6 fL (ref 80.0–100.0)
Monocytes Absolute: 1.2 10*3/uL — ABNORMAL HIGH (ref 0.1–1.0)
Monocytes Relative: 13 %
Neutro Abs: 6.1 10*3/uL (ref 1.7–7.7)
Neutrophils Relative %: 65 %
Platelets: 217 10*3/uL (ref 150–400)
RBC: 4.36 MIL/uL (ref 4.22–5.81)
RDW: 15.2 % (ref 11.5–15.5)
WBC: 9.4 10*3/uL (ref 4.0–10.5)
nRBC: 0 % (ref 0.0–0.2)

## 2018-07-31 LAB — COMPREHENSIVE METABOLIC PANEL
ALT: 18 U/L (ref 0–44)
AST: 20 U/L (ref 15–41)
Albumin: 2.2 g/dL — ABNORMAL LOW (ref 3.5–5.0)
Alkaline Phosphatase: 160 U/L — ABNORMAL HIGH (ref 38–126)
Anion gap: 9 (ref 5–15)
BUN: 14 mg/dL (ref 6–20)
CO2: 24 mmol/L (ref 22–32)
Calcium: 8 mg/dL — ABNORMAL LOW (ref 8.9–10.3)
Chloride: 104 mmol/L (ref 98–111)
Creatinine, Ser: 0.91 mg/dL (ref 0.61–1.24)
GFR calc Af Amer: >60 mL/min (ref >60)
GFR calc non Af Amer: >60 mL/min (ref >60)
Glucose, Bld: 77 mg/dL (ref 70–99)
Potassium: 3 mmol/L — ABNORMAL LOW (ref 3.5–5.1)
Sodium: 137 mmol/L (ref 135–145)
Total Bilirubin: 0.8 mg/dL (ref 0.3–1.2)
Total Protein: 4.8 g/dL — ABNORMAL LOW (ref 6.5–8.1)

## 2018-07-31 LAB — URINALYSIS, ROUTINE W REFLEX MICROSCOPIC
Bilirubin Urine: NEGATIVE
Glucose, UA: NEGATIVE mg/dL
Hgb urine dipstick: NEGATIVE
Ketones, ur: NEGATIVE mg/dL
Leukocytes,Ua: NEGATIVE
Nitrite: NEGATIVE
Protein, ur: NEGATIVE mg/dL
Specific Gravity, Urine: 1.019 (ref 1.005–1.030)
pH: 5 (ref 5.0–8.0)

## 2018-07-31 LAB — RAPID URINE DRUG SCREEN, HOSP PERFORMED
Amphetamines: POSITIVE — AB
Barbiturates: NOT DETECTED
Benzodiazepines: POSITIVE — AB
Cocaine: NOT DETECTED
Opiates: NOT DETECTED
Tetrahydrocannabinol: NOT DETECTED

## 2018-07-31 LAB — SARS CORONAVIRUS 2 BY RT PCR (HOSPITAL ORDER, PERFORMED IN ~~LOC~~ HOSPITAL LAB): SARS Coronavirus 2: NEGATIVE

## 2018-07-31 LAB — ETHANOL: Alcohol, Ethyl (B): <10 mg/dL (ref <10)

## 2018-07-31 LAB — SALICYLATE LEVEL: Salicylate Lvl: <7 mg/dL (ref 2.8–30.0)

## 2018-07-31 LAB — ACETAMINOPHEN LEVEL: Acetaminophen (Tylenol), Serum: <10 ug/mL — ABNORMAL LOW (ref 10–30)

## 2018-07-31 MED ORDER — HYDROCODONE-ACETAMINOPHEN 5-325 MG PO TABS
1.0000 | ORAL_TABLET | Freq: Four times a day (QID) | ORAL | Status: DC | PRN
  Administered 2018-07-31 – 2018-08-01 (×2): 1 via ORAL
  Filled 2018-07-31 (×2): qty 1

## 2018-07-31 MED ORDER — PREDNISONE 20 MG PO TABS
30.0000 mg | ORAL_TABLET | Freq: Every day | ORAL | Status: DC
  Filled 2018-07-31: qty 2

## 2018-07-31 MED ORDER — POTASSIUM CHLORIDE CRYS ER 20 MEQ PO TBCR
40.0000 meq | EXTENDED_RELEASE_TABLET | Freq: Once | ORAL | Status: DC
  Filled 2018-07-31: qty 2

## 2018-07-31 MED ORDER — CLONAZEPAM 0.5 MG PO TABS
0.5000 mg | ORAL_TABLET | Freq: Two times a day (BID) | ORAL | Status: DC
  Administered 2018-07-31 – 2018-08-01 (×2): 0.5 mg via ORAL
  Filled 2018-07-31 (×3): qty 1

## 2018-07-31 MED ORDER — POTASSIUM CHLORIDE CRYS ER 20 MEQ PO TBCR: 40.0000 meq | EXTENDED_RELEASE_TABLET | Freq: Once | ORAL | Status: DC

## 2018-07-31 MED ORDER — PREDNISONE 20 MG PO TABS
30.0000 mg | ORAL_TABLET | Freq: Once | ORAL | Status: AC
  Administered 2018-07-31: 30 mg via ORAL
  Filled 2018-07-31: qty 2

## 2018-07-31 MED ORDER — SODIUM CHLORIDE 0.9 % IV BOLUS
1000.0000 mL | Freq: Once | INTRAVENOUS | Status: AC
  Administered 2018-07-31: 1000 mL via INTRAVENOUS

## 2018-07-31 NOTE — ED Provider Notes (Signed)
[place] EMERGENCY DEPARTMENT Provider Note   CSN: [id number] Arrival date & time: 07/31/18  6063    History   Chief Complaint Chief Complaint  Patient presents with  . Suicidal  . Drug Overdose  The above chief complaint is incorrect.  This was charted on the wrong patient.  HPI [name] is a 53 y.o. male.     HPI   [name] is a 53 y.o. male, with a history of Crohn's disease, autoimmune hepatitis, suicide attempts, Grovers disease, distant alcohol abuse, presenting to the ED with ingestion.  States he drank 8 ounces of household bleach mixed with 8 ounces of milk around 12:30 AM this morning.  He also took 0.25m klonopin at 4pm, 7pm and 11pm last night. He states, "I just do not want to do it anymore.  I cannot be here anymore.  I do not want to live anymore." He endorses a fullness in the abdomen, but no pain. He has had previous suicide attempts.  He states he has not had alcohol in over 20 years.  Denies drug use. Denies recent illness, fever/chills, chest pain, shortness of breath, throat pain or swelling, difficulty swallowing, dizziness, voice change, N/V, or any other complaints.  GI: [name] Past Medical History:  Diagnosis Date  . Allergy   . Anal fistula   . Anxiety   . 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 Autoimmune hepatitis ([place] 01/19/2013  . Avoidant-restrictive food intake disorder (ARFID) ? 06/15/2018  . Cancer of skin of neck   . Cataract   . Chronic mesenteric ischemia (HC[place]   . Chronic pain syndrome 07/09/2016  . Crohn's disease of small and large intestines (HC[place]    followed by dr [name]  . Dairy product intolerance   . Diarrhea, functional   . Family history of adverse reaction to anesthesia   . Grover's disease    transient acantholytic dermatosis  . History of alcohol abuse   . History of basal cell carcinoma excision    2013 left leg  . History of Clostridium difficile    10/ 2014  .  History of multiple concussions    x6   last one Jan 2017 per pt--  no residual  . History of substance abuse ([place]    quit 1997 per pt  . History of suicide attempt    05-18-2012  overdose /  failure to thrive  . Hypercholesterolemia   . Iron deficiency anemia due to chronic blood loss   . Major depression, recurrent, chronic ([place]   . Osteopenia   . Personal history of adenomatous colonic polyps 12/2010, 03/2012   12/2010 - 8 mm serrated adenoma of rectum  . Portal vein thrombosis 03/21/2015   right  . Primary sclerosing cholangitis    ? hepatitis overlap - liver bx x 2 and MRCP  . Seasonal allergies   . Substance abuse ([place] 1997   Alcohol  . Vitamin A deficiency 05/23/2018  . Vitamin B6 deficiency 07/27/2018  . Vitamin C deficiency 05/23/2018    Patient Active Problem List   Diagnosis Date Noted  . Suicide attempt (HEag[place]   . Crohn's disease with complication ([place]   . Vitamin B6 deficiency 07/27/2018  . Vitamin E deficiency 07/27/2018  . Anxiety and depression   . Abdominal pain 07/19/2018  . Partial small bowel obstruction (HG[place] 07/19/2018  . Avoidant-restrictive food intake disorder (ARFID) ? 06/15/2018  . Dilated intrahepatic bile ducts   .  Vitamin C deficiency 05/23/2018  . Vitamin A deficiency 05/23/2018  . Loss of weight 04/07/2018  . Grover's disease 11/04/2017  . Chronic pain syndrome 07/09/2016  . Perirectal fistula 05/18/2016  . Portal vein thrombosis 03/28/2015  . Insomnia 11/29/2014  . Long-term use of immunosuppressant medication - Entyvio 11/28/2014  . Posterior Anal fissure 10/03/2014  . Anemia due to chronic blood loss 02/11/2014  . 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 Rash on trunk 12/31/2012  . Arthralgia 08/28/2012  . Major depressive disorder, recurrent episode, severe ([place]) 04/12/2012  . Personal history of adenomatous colonic polyps 01/18/2012  . Osteopenia 02/17/2011  . Long term current use of systemic steroids 08/11/2010  .  Essential hypertension 08/09/2008  . VITAMIN D DEFICIENCY 09/26/2007  . HYPERLIPIDEMIA, SEVERE 09/26/2007  . CROHN'S DISEASE, LARGE AND SMALL INTESTINES 03/28/2007    Past Surgical History:  Procedure Laterality Date  . ABDOMINAL AORTAGRAM N/A 03/29/2012   Procedure: ABDOMINAL Maxcine Ham;  Surgeon: Serafina Mitchell, MD;  Location: [facility] CATH LAB;  Service: Cardiovascular;  Laterality: N/A;  . ADENOIDECTOMY  age 39  . BIOPSY  05/31/2018   Procedure: BIOPSY;  Surgeon: Milus Banister, MD;  Location: [facility] ENDOSCOPY;  Service: Endoscopy;;  . CATARACT EXTRACTION W/ INTRAOCULAR LENS  IMPLANT, BILATERAL  2009  . COLONOSCOPY  2001, 05/02/2003, 01/28/11   2012: Right colon Crohn's, rectal polyp  . COLONOSCOPY  03/31/2012   Procedure: COLONOSCOPY;  Surgeon: Jerene Bears, MD;  Location: [facility];  Service: Gastroenterology;  Laterality: N/A;  . COLONOSCOPY WITH PROPOFOL N/A 05/31/2018   Procedure: COLONOSCOPY WITH PROPOFOL;  Surgeon: Milus Banister, MD;  Location: [facility] ENDOSCOPY;  Service: Endoscopy;  Laterality: N/A;  . ESOPHAGOGASTRODUODENOSCOPY  01/28/11   Normal  . FOOT SURGERY Right age 24  . MOHS SURGERY Left 11/2013   left ankle parakerotosis   . PERCUTANEOUS LIVER BIOPSY  2007 and 2008  . PILONIDAL CYST EXCISION  age 73  . PLACEMENT OF SETON N/A 11/06/2015   Procedure: PLACEMENT OF SETON;  Surgeon: [name], MD;  [facility];  Service: General;  Laterality: N/A;  . UPPER GASTROINTESTINAL ENDOSCOPY          Home Medications    Prior to Admission medications   Medication Sig Start Date End Date Taking? Authorizing Provider  clonazePAM (KLONOPIN) 0.5 MG tablet Take 0.5 mg by mouth 2 (two) times daily. 07/26/18  Yes [provider]  HYDROcodone-acetaminophen (NORCO/VICODIN) 5-325 MG tablet Take 1 tablet by mouth every 6 (six) hours as needed for moderate pain or severe pain. 07/25/18  Yes [name], MD  predniSONE (DELTASONE) 5 MG tablet Take 30 mg by  mouth daily with breakfast.    Yes [provider]  AMBULATORY NON FORMULARY MEDICATION Dilitazem 2% cream  Apply pea size amount to rectum twice daily Patient not taking: Reported on 07/06/2018 05/02/18   [name], MD  dicyclomine (BENTYL) 20 MG tablet Take 1 tablet (20 mg total) by mouth 4 (four) times daily -  before meals and at bedtime. Patient not taking: Reported on 07/19/2018 07/10/18   [name], MD  ferrous sulfate 325 (65 FE) MG tablet Take 1 tablet (325 mg total) by mouth daily for 30 days. Patient not taking: Reported on 07/31/2018 07/21/18 08/20/18  Shelly Coss, MD  folic acid (FOLVITE) 1 MG tablet Take 1 tablet (1 mg total) by mouth daily for 30 days. Patient not taking: Reported on 07/31/2018 07/21/18 08/20/18  Shelly Coss,  MD  haloperidol (HALDOL) 2 MG/ML solution Take 0.5 mLs (1 mg total) by mouth every 6 (six) hours as needed for agitation (or nausea). Hospice patient Terminal diagnosis: Crohn's disease/Primary Sclerosing Cholangitis/Autoimmune Hepatitis Patient not taking: Reported on 07/31/2018 07/21/18   Micheline Rough, MD  hydrocortisone (ANUSOL-HC) 2.5 % rectal cream Place rectally 2 (two) times daily. Patient not taking: Reported on 07/31/2018 07/21/18   Shelly Coss, MD  loperamide (IMODIUM) 2 MG capsule Take 1 capsule (2 mg total) by mouth 2 (two) times daily. Patient not taking: Reported on 07/06/2018 06/01/18   Reyne Dumas, MD  LORazepam (ATIVAN) 2 MG/ML concentrated solution Take 0.3 mLs (0.6 mg total) by mouth every 4 (four) hours as needed for anxiety. Patient not taking: Reported on 07/31/2018 07/21/18   Micheline Rough, MD  mirtazapine (REMERON SOL-TAB) 15 MG disintegrating tablet Take 1 tablet (15 mg total) by mouth at bedtime. Patient not taking: Reported on 07/31/2018 07/27/18   [name], MD  ondansetron (ZOFRAN) 4 MG tablet Take 1 tablet (4 mg total) by mouth every 6 (six) hours as needed for nausea. Patient not taking: Reported on 07/06/2018 06/01/18    Reyne Dumas, MD    Family History Family History  Problem Relation Age of Onset  . Heart disease Father   . Thyroid cancer Father   . Allergies Father   . Clotting disorder Father   . Breast cancer Mother   . Stomach cancer Mother   . Colon cancer Neg Hx   . Esophageal cancer Neg Hx   . Rectal cancer Neg Hx     Social History Social History   Tobacco Use  . Smoking status: Former Smoker    Packs/day: 1.00    Years: 15.00    Pack years: 15.00    Types: Cigarettes    Last attempt to quit: 03/28/1997    Years since quitting: 21.3  . Smokeless tobacco: Never Used  Substance Use Topics  . Alcohol use: No    Alcohol/week: 0.0 standard drinks  . Drug use: No    Comment: QUIT USING DRUGS IN 1997     Allergies   Asacol [mesalamine]; Flagyl [metronidazole]; Azathioprine; Gluten meal; Mycophenolate mofetil; Other; Wheat bran; and Tape   Review of Systems Review of Systems  Constitutional: Negative for chills, diaphoresis and fever.  HENT: Negative for facial swelling, trouble swallowing and voice change.        Ingestion of bleach  Respiratory: Negative for cough and shortness of breath.   Cardiovascular: Negative for chest pain.  Gastrointestinal: Negative for abdominal pain, nausea and vomiting.  Musculoskeletal: Negative for neck pain and neck stiffness.  Neurological: Negative for dizziness and syncope.  Psychiatric/Behavioral: Positive for suicidal ideas.  All other systems reviewed and are negative.    Physical Exam Updated Vital Signs BP 123/83 (BP Location: Left Arm)   Pulse (!) 57   Temp 98.7 F (37.1 C)   Resp 18   SpO2 95%   Physical Exam Vitals signs and nursing note reviewed.  Constitutional:      General: He is not in acute distress.    Appearance: He is well-developed. He is not diaphoretic.  HENT:     Head: Normocephalic and atraumatic.     Mouth/Throat:     Mouth: Mucous membranes are moist.     Pharynx: Oropharynx is clear.      Comments: Mild erythema in the posterior pharynx.  No hemorrhage or lesions.  No noted swelling.  No trismus.  Mouth  opening to at least 3 finger widths.  Handles oral secretions without difficulty.  No noted facial swelling.  No sublingual swelling.  No swelling or tenderness to the submental or submandibular regions.  No swelling or tenderness into the soft tissues of the neck. Eyes:     Conjunctiva/sclera: Conjunctivae normal.  Neck:     Musculoskeletal: Neck supple.  Cardiovascular:     Rate and Rhythm: Normal rate and regular rhythm.     Pulses: Normal pulses.          Radial pulses are 2+ on the right side and 2+ on the left side.       Posterior tibial pulses are 2+ on the right side and 2+ on the left side.     Heart sounds: Normal heart sounds.     Comments: Tactile temperature in the extremities appropriate and equal bilaterally. Pulmonary:     Effort: Pulmonary effort is normal. No respiratory distress.     Breath sounds: Normal breath sounds.  Abdominal:     Palpations: Abdomen is soft.     Tenderness: There is no abdominal tenderness. There is no guarding.  Musculoskeletal:     Right lower leg: No edema.     Left lower leg: No edema.  Lymphadenopathy:     Cervical: No cervical adenopathy.  Skin:    General: Skin is warm and dry.  Neurological:     Mental Status: He is alert.  Psychiatric:        Mood and Affect: Affect normal. Mood is depressed.        Speech: Speech normal.        Behavior: Behavior is withdrawn.      ED Treatments / Results  Labs (all labs ordered are listed, but only abnormal results are displayed) Labs Reviewed  COMPREHENSIVE METABOLIC PANEL - Abnormal; Notable for the following components:      Result Value   Potassium 3.0 (*)    Calcium 8.0 (*)    Total Protein 4.8 (*)    Albumin 2.2 (*)    Alkaline Phosphatase 160 (*)    All other components within normal limits  RAPID URINE DRUG SCREEN, HOSP PERFORMED - Abnormal; Notable for the  following components:   Benzodiazepines POSITIVE (*)    Amphetamines POSITIVE (*)    All other components within normal limits  URINALYSIS, ROUTINE W REFLEX MICROSCOPIC - Abnormal; Notable for the following components:   APPearance HAZY (*)    All other components within normal limits  CBC WITH DIFFERENTIAL/PLATELET - Abnormal; Notable for the following components:   Hemoglobin 12.0 (*)    HCT 38.2 (*)    Monocytes Absolute 1.2 (*)    All other components within normal limits  ACETAMINOPHEN LEVEL - Abnormal; Notable for the following components:   Acetaminophen (Tylenol), Serum <10 (*)    All other components within normal limits  SARS CORONAVIRUS 2 (HOSPITAL ORDER, [place] [facility])  ETHANOL  SALICYLATE LEVEL   Hemoglobin  Date Value Ref Range Status  07/31/2018 12.0 (L) 13.0 - 17.0 g/dL Final  07/20/2018 11.0 (L) 13.0 - 17.0 g/dL Final  07/19/2018 11.5 (L) 13.0 - 17.0 g/dL Final  07/19/2018 13.2 13.0 - 17.0 g/dL Final    EKG EKG Interpretation  Date/Time:  Monday July 31 2018 08:31:24 EDT Ventricular Rate:  72 PR Interval:    QRS Duration: 99 QT Interval:  380 QTC Calculation: 416 R Axis:   46 Text Interpretation:  Normal sinus rhythm  Poor data quality Non-specific ST-t changes Confirmed by Pattricia Boss [phone]) on 07/31/2018 11:15:51 AM   Radiology Dg Abdomen 1 View  Result Date: 07/31/2018 CLINICAL DATA:  Bleach ingestion, abdominal pain EXAM: ABDOMEN - 1 VIEW COMPARISON:  07/19/2018 abdominal radiograph FINDINGS: No dilated small bowel loops. No evidence pneumatosis or pneumoperitoneum. Minimal colonic stool. No radiopaque nephrolithiasis. IMPRESSION: Nonobstructive bowel gas pattern. No evidence pneumoperitoneum on this supine view. Electronically Signed   By: Ilona Sorrel M.D.   On: 07/31/2018 09:05   Dg Chest Portable 1 View  Result Date: 07/31/2018 CLINICAL DATA:  Weakness.  Altered mental status EXAM: PORTABLE CHEST 1 VIEW COMPARISON:   07/19/2018 FINDINGS: The heart size and mediastinal contours are within normal limits. Both lungs are clear. The visualized skeletal structures are unremarkable. IMPRESSION: No active disease. Electronically Signed   By: Kerby Moors M.D.   On: 07/31/2018 09:05    Procedures Procedures (including critical care time)  Medications Ordered in ED Medications  sodium chloride 0.9 % bolus 1,000 mL (1,000 mLs Intravenous New Bag/Given 07/31/18 1151)     Initial Impression / Assessment and Plan / ED Course  I have reviewed the triage vital signs and the nursing notes.  Pertinent labs & imaging results that were available during my care of the patient were reviewed by me and considered in my medical decision making (see chart for details).  Clinical Course as of Jul 31 1410  Mon Jul 31, 2018  0810 Spoke with [facility] with [facility].  Basic labs, APAP level. GI consult if any abdominal or throat discomfort. CXR and KUB.  6-hour observation recommended.   [SJ]  9169 Spoke with Azucena Freed, PA with [facility]. They will see the patient.   [SJ]  1410 Consistent with previous values.  Calcium(!): 8.0 [SJ]    Clinical Course User Index [SJ] Clovis Mankins C, PA-C       Patient presents following ingestion of household bleach with the intent of self-harm.  Abdominal exam benign. Patient is nontoxic appearing, afebrile, not tachycardic, not tachypneic, not hypotensive, maintains excellent SPO2 on room air, and is in no apparent distress.  Mild hypokalemia noted and addressed.  Otherwise labs are reassuring and abnormalities consistent with previous values. Evaluated by GI.  They have no further plans for the patient. Please see today's note by Azucena Freed, PA-C for finer point suggestions. Evaluated by TTS and recommended for inpatient treatment. He has been medically cleared.  Current home medications ordered.  Findings and plan of care discussed with Pattricia Boss, MD.   Vitals:    07/31/18 1100 07/31/18 1115 07/31/18 1130 07/31/18 1145  BP: 140/85 (!) 156/89 (!) 159/90 140/88  Pulse: 72 80 72 72  Resp: (!) 21 14 18 11   Temp:      TempSrc:      SpO2: 100% 100% 100% 100%  Weight:      Height:          Final Clinical Impressions(s) / ED Diagnoses   Final diagnoses:  Suicidal ideation  Ingestion of bleach, intentional self-harm, initial encounter [facility])    ED Discharge Orders    None       Layla Maw 07/31/18 1417    Pattricia Boss, MD 08/02/18 1219

## 2018-07-31 NOTE — Progress Notes (Addendum)
Pt accepted to [facility], main campus.  Dr. Jonelle Sports is the accepting/attending provider.   Call report to [phone] [facility] @ [facility] ED notified.    Pt is currently voluntary and has reportedly told [facility] ED staff that he is "ready to go home". [facility] ED provider will determine of pt is appropriate to be involuntary committed.    Pt is scheduled to arrive to [facility] on 08/01/18 after 8am.  CSW will continue to follow  Rhae Hammock Disposition CSW [facility]/TTS [phone]  UPDATE: Per Monique @ [facility] ED, pt is being involuntary committed. Admission staff at [facility] has been notified. Completed IVC paperwork should be sent to [facility] at [phone].

## 2018-07-31 NOTE — Progress Notes (Signed)
Pt meets inpatient criteria per Ricky Ala, NP. Referral information has been sent to the following hospitals for review:  [facility]  [place]  [facility]  [facility]   Disposition will continue to assist with inpatient placement needs.   Audree Camel, LCSW, [place] Disposition [facility]/TTS [phone]

## 2018-07-31 NOTE — ED Notes (Signed)
Sitter at bedside.

## 2018-07-31 NOTE — Consult Note (Signed)
[facility] Consult: 9:15 AM 07/31/2018  LOS: 0 days    Referring Provider: Dr  Jeanell Sparrow in ED Primary Care Physician:  [name] Cruel, MD Primary Gastroenterologist:  [name]. Carlean [name]    Reason for Consultation:  Chrohn's, abdominal pain.  Bloody stool.     HPI: [name] is a 53 y.o. male.  Hx Crohn's disease, autoimmune hepatitis, depression, suicide attempts, Grovers disease  Very complicated pt of [name] with Colonic and SB Crohn's dz, chronic abd pain,  AIH overlap.  Management complicated by severe depression and anxiety and non-compliance with IBD meds.  Pt says all his meds make him worse and cause abd pain.    Admission 07/19/18 - 07/21/18 to [facility] hospital with FTT (malnutrition, weight loss, anorexia) and desire to enter hospice and palliative care for EOL pathway.  Fentanyl patch added, (oral pain meds caused intolerable s/e).  Continued to taper Prednisone (pt initiated taper PTA) as these also caused abd pain, dose 34m/day at discharge.   Elevation alk phos.  CTAP showed mild wall thickening in descending colon with pericolonic inflammatory change consistent with early colitis. Chronic, stable (dating to 2017) changes within the liver, and intrahepatic biliary ducts related to prior portal vein thrombosis on the right.  Resolution of previous fullness in region of cecum. CT did not confirm SBO as suggested on initial KUB.    Pt agreeable to trial Remeron Solutab for depression as of 5/28.  He took 1 dose of this and says it caused gas, bloating, abdominal pain and he did not take it anymore.   [name]. GCarlean Purlwas not able to convince him to take iron, folate, vitamin A, vitamin B6, vitamin E supplements all of which were depleted on blood tests of 5/22.  On his own, the patient upped his dose from 5  mg to 30 mg of prednisone daily as of 07/22/2018.  Patient admits this is actually helped the visceral abdominal pain but he still is having his usual 5 or 6 diarrheal stools/day, and until this morning was seeing little to no blood.  He continues to have drainage from a perirectal abscess which is painful, tender.  No fevers.  He has been eating better but when he eats it causes abdominal distress.  Living at home with parents.    In ED.  Drank 8 ounces of household bleach mixed with milk ~ 12:30 AM this morning. Additionally took 0.561mklonopin at 4pm, 7pm and 11pm last night.  He waited until about 6 AM this morning and was disappointed that he did not die.  Informed his dad that he had drank the bleach.  His father subsequently called EMS. At about 6 AM this morning his stools were bloody.  Denies worsening abdominal pain, denies nausea. Hgb 12, WBCs, platelets normal.   Pending labs include salicylate, acetaminophen, CMET, Covid 19.  CXR negative.    Patient vague when questioned what he wants out of his life at this point.  He still wants to be DNR, wants to die, would prefer not to be at  home but is not enthusiastic about going to a hospice care facility.       Past Medical History:  Diagnosis Date  . Allergy   . Anal fistula   . Anxiety   . 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 Autoimmune hepatitis ([place]) 01/19/2013  . Avoidant-restrictive food intake disorder (ARFID) ? 06/15/2018  . Cancer of skin of neck   . Cataract   . Chronic mesenteric ischemia ([place])   . Chronic pain syndrome 07/09/2016  . Crohn's disease of small and large intestines ([place])    followed by [name]  . Dairy product intolerance   . Diarrhea, functional   . Family history of adverse reaction to anesthesia   . Grover's disease    transient acantholytic dermatosis  . History of alcohol abuse   . History of basal cell carcinoma excision    2013 left leg  . History of Clostridium difficile    10/  2014  . History of multiple concussions    x6   last one Jan 2017 per pt--  no residual  . History of substance abuse ([place])    quit 1997 per pt  . History of suicide attempt    05-18-2012  overdose /  failure to thrive  . Hypercholesterolemia   . Iron deficiency anemia due to chronic blood loss   . Major depression, recurrent, chronic ([name])   . Osteopenia   . Personal history of adenomatous colonic polyps 12/2010, 03/2012   12/2010 - 8 mm serrated adenoma of rectum  . Portal vein thrombosis 03/21/2015   right  . Primary sclerosing cholangitis    ? hepatitis overlap - liver bx x 2 and MRCP  . Seasonal allergies   . Substance abuse ([place]) 1997   Alcohol  . Vitamin A deficiency 05/23/2018  . Vitamin B6 deficiency 07/27/2018  . Vitamin C deficiency 05/23/2018    Past Surgical History:  Procedure Laterality Date  . ABDOMINAL AORTAGRAM N/A 03/29/2012   Procedure: ABDOMINAL Maxcine Ham;  Surgeon: [name] Mitchell, MD;  Location: [facility] CATH LAB;  Service: Cardiovascular;  Laterality: N/A;  . ADENOIDECTOMY  age 58  . BIOPSY  05/31/2018   Procedure: BIOPSY;  Surgeon: [name] Banister, MD;  Location: [facility];  Service: Endoscopy;;  . CATARACT EXTRACTION W/ INTRAOCULAR LENS  IMPLANT, BILATERAL  2009  . COLONOSCOPY  2001, 05/02/2003, 01/28/11   2012: Right colon Crohn's, rectal polyp  . COLONOSCOPY  03/31/2012   Procedure: COLONOSCOPY;  Surgeon: [name] Bears, MD;  Location: [place];  Service: Gastroenterology;  Laterality: N/A;  . COLONOSCOPY WITH PROPOFOL N/A 05/31/2018   Procedure: COLONOSCOPY WITH PROPOFOL;  Surgeon: [name] Banister, MD;  Location: [facility] ENDOSCOPY;  Service: Endoscopy;  Laterality: N/A;  . ESOPHAGOGASTRODUODENOSCOPY  01/28/11   Normal  . FOOT SURGERY Right age 89  . MOHS SURGERY Left 11/2013   left ankle parakerotosis   . PERCUTANEOUS LIVER BIOPSY  2007 and 2008  . PILONIDAL CYST EXCISION  age 7  . PLACEMENT OF SETON N/A 11/06/2015   Procedure: PLACEMENT OF SETON;  Surgeon:  [name] Ruff, MD;  Location: [facility];  Service: General;  Laterality: N/A;  . UPPER GASTROINTESTINAL ENDOSCOPY      Prior to Admission medications   Medication Sig Start Date End Date Taking? Authorizing Provider  AMBULATORY NON FORMULARY MEDICATION Dilitazem 2% cream  Apply pea size amount to rectum twice daily Patient not taking: Reported on 07/06/2018 05/02/18  [name], MD  dicyclomine (BENTYL) 20 MG tablet Take 1 tablet (20 mg total) by mouth 4 (four) times daily -  before meals and at bedtime. Patient not taking: Reported on 07/19/2018 07/10/18   [name], MD  ferrous sulfate 325 (65 FE) MG tablet Take 1 tablet (325 mg total) by mouth daily for 30 days. 07/21/18 08/20/18  Shelly Coss, MD  folic acid (FOLVITE) 1 MG tablet Take 1 tablet (1 mg total) by mouth daily for 30 days. 07/21/18 08/20/18  Shelly Coss, MD  haloperidol (HALDOL) 2 MG/ML solution Take 0.5 mLs (1 mg total) by mouth every 6 (six) hours as needed for agitation (or nausea). Hospice patient Terminal diagnosis: Crohn's disease/Primary Sclerosing Cholangitis/Autoimmune Hepatitis 07/21/18   Micheline Rough, MD  HYDROcodone-acetaminophen (NORCO/VICODIN) 5-325 MG tablet Take 1 tablet by mouth every 6 (six) hours as needed for moderate pain or severe pain. 07/25/18   [name], MD  hydrocortisone (ANUSOL-HC) 2.5 % rectal cream Place rectally 2 (two) times daily. 07/21/18   Shelly Coss, MD  loperamide (IMODIUM) 2 MG capsule Take 1 capsule (2 mg total) by mouth 2 (two) times daily. Patient not taking: Reported on 07/06/2018 06/01/18   Reyne Dumas, MD  LORazepam (ATIVAN) 2 MG/ML concentrated solution Take 0.3 mLs (0.6 mg total) by mouth every 4 (four) hours as needed for anxiety. 07/21/18   Micheline Rough, MD  mirtazapine (REMERON SOL-TAB) 15 MG disintegrating tablet Take 1 tablet (15 mg total) by mouth at bedtime. 07/27/18   [name], MD  ondansetron (ZOFRAN) 4 MG tablet Take 1 tablet (4 mg  total) by mouth every 6 (six) hours as needed for nausea. Patient not taking: Reported on 07/06/2018 06/01/18   Reyne Dumas, MD  predniSONE (DELTASONE) 5 MG tablet Take 5 mg by mouth daily with breakfast.    [provider]    Scheduled Meds:  Infusions:  PRN Meds:    Allergies as of 07/31/2018 - Review Complete 07/31/2018  Allergen Reaction Noted  . Asacol [mesalamine] Diarrhea 03/09/2011  . Flagyl [metronidazole] Diarrhea 02/05/2016  . Azathioprine Diarrhea 08/07/2010  . Gluten meal Diarrhea 03/21/2015  . Mycophenolate mofetil Diarrhea 08/07/2010  . Other  01/18/2012  . Wheat bran Diarrhea 01/18/2012  . Tape Itching and Rash 03/21/2015    Family History  Problem Relation Age of Onset  . Heart disease Father   . Thyroid cancer Father   . Allergies Father   . Clotting disorder Father   . Breast cancer Mother   . Stomach cancer Mother   . Colon cancer Neg Hx   . Esophageal cancer Neg Hx   . Rectal cancer Neg Hx     Social History   Socioeconomic History  . Marital status: Single    Spouse name: Not on file  . Number of children: 1  . Years of education: Not on file  . Highest education level: Not on file  Occupational History  . Occupation: Disability   Social Needs  . Financial resource strain: Not on file  . Food insecurity:    Worry: Not on file    Inability: Not on file  . Transportation needs:    Medical: Not on file    Non-medical: Not on file  Tobacco Use  . Smoking status: Former Smoker    Packs/day: 1.00    Years: 15.00    Pack years: 15.00    Types: Cigarettes    Last attempt to quit: 03/28/1997    Years  since quitting: 21.3  . Smokeless tobacco: Never Used  Substance and Sexual Activity  . Alcohol use: No    Alcohol/week: 0.0 standard drinks  . Drug use: No    Comment: QUIT USING DRUGS IN 1997  . Sexual activity: Not Currently  Lifestyle  . Physical activity:    Days per week: Not on file    Minutes per session: Not on file  .  Stress: Not on file  Relationships  . Social connections:    Talks on phone: Not on file    Gets together: Not on file    Attends religious service: Not on file    Active member of club or organization: Not on file    Attends meetings of clubs or organizations: Not on file    Relationship status: Not on file  . Intimate partner violence:    Fear of current or ex partner: Not on file    Emotionally abused: Not on file    Physically abused: Not on file    Forced sexual activity: Not on file  Other Topics Concern  . Not on file  Social History Narrative   Single, history of substance abuse in recovery   Previously occupied on medical disability   1 child    Elderly parents involved and he helps them   1 brother in [place]: Constitutional: Fatigue, weakness. ENT:  No nose bleeds Pulm: No cough, no trouble breathing. CV:  No palpitations, no LE edema.  No anginal symptoms GU:  No hematuria, no frequency GI: Per HPI. Heme: No unusual or excessive bleeding other than bloody stool this morning. Transfusions: No epic records of previous transfusions PRBCs or other blood products. Neuro:  No headaches, no peripheral tingling or numbness.  No seizures.  No syncope. Derm:  No itching.  Draining perirectal abscess.  Various skin lesions. Endocrine:  No sweats or chills.  No polyuria or dysuria Immunization: Not clear if vaccination history is up-to-date but none have been given to the patient since 2016.  However his PMD is an [place] so any vaccinations given at [place] would not show up in epic. Travel:  None    PHYSICAL EXAM: Vital signs in last 24 hours: Vitals:   07/31/18 0845 07/31/18 0900  BP:  134/83  Pulse: 72 68  Resp: 20 16  Temp:    SpO2: 100% 100%   Wt Readings from Last 3 Encounters:  07/31/18 63.5 kg  07/19/18 70.3 kg  07/06/18 70.3 kg    General: Thin, pale, not acutely ill but chronically ill looking WM. Head: No  facial asymmetry or swelling.  No signs of head trauma. Eyes: Conjunctiva pink.  No scleral icterus.  EOMI. Ears: Not hard of hearing Nose: No discharge or congestion Mouth: Oral mucosa moist, pink, clear.  No signs of mucosal injury.  Tongue midline Neck: No JVD, no masses, no thyromegaly. Lungs: A few crackles in the bases.  No labored breathing or cough. Heart: RRR.  No MRG.  S1, S2 present. Abdomen: Soft.  Not tender, not distended.  Active bowel sounds.  No masses, no HSM, no bruits, no hernias.   Rectal: Did not perform DRE but visually there is erythema the perirectal area.  There is an indurated, minimally tender perirectal abscess located at about 4 PM position.  She is not currently actively draining Musc/Skeltl: No joint redness, swelling.  Slight kyphosis.  Overall osteoporotic appearance Extremities: No CCE.  Limbs are  thin Neurologic: Oriented x3.  Able to provide good history.  Moves all 4 limbs.  No tremors.  No limb weakness.  No gross deficits. Skin: Patches of ?  Normal on the top of the right foot Tattoos: None Nodes: No cervical adenopathy. Psych: Affect flat but fully conversant.  Gets a little bit agitated, frustrated when trying to express his emotional state  Intake/Output from previous day: No intake/output data recorded. Intake/Output this shift: No intake/output data recorded.  LAB RESULTS: No results for input(s): WBC, HGB, HCT, PLT in the last 72 hours. BMET Lab Results  Component Value Date   NA 139 07/20/2018   NA 139 07/19/2018   NA 134 (L) 06/01/2018   K 3.6 07/20/2018   K 3.4 (L) 07/19/2018   K 3.6 06/01/2018   CL 108 07/20/2018   CL 99 07/19/2018   CL 105 06/01/2018   CO2 26 07/20/2018   CO2 31 07/19/2018   CO2 23 06/01/2018   GLUCOSE 59 (L) 07/20/2018   GLUCOSE 91 07/19/2018   GLUCOSE 94 06/01/2018   BUN 6 07/20/2018   BUN 8 07/19/2018   BUN <5 (L) 06/01/2018   CREATININE 0.89 07/20/2018   CREATININE 0.82 07/19/2018   CREATININE  0.89 07/19/2018   CALCIUM 7.9 (L) 07/20/2018   CALCIUM 8.3 (L) 07/19/2018   CALCIUM 7.5 (L) 06/01/2018   LFT No results for input(s): PROT, ALBUMIN, AST, ALT, ALKPHOS, BILITOT, BILIDIR, IBILI in the last 72 hours. PT/INR Lab Results  Component Value Date   INR 1.2 07/21/2018   INR 1.1 05/28/2018   INR 1.2 (H) 04/25/2018   Hepatitis Panel No results for input(s): HEPBSAG, HCVAB, HEPAIGM, HEPBIGM in the last 72 hours. C-Diff No components found for: CDIFF Lipase     Component Value Date/Time   LIPASE 24 07/19/2018 1358    Drugs of Abuse     Component Value Date/Time   LABOPIA NONE DETECTED 05/18/2012 1210   COCAINSCRNUR NONE DETECTED 05/18/2012 1210   LABBENZ POSITIVE (A) 05/18/2012 1210   AMPHETMU NONE DETECTED 05/18/2012 1210   THCU NONE DETECTED 05/18/2012 1210   LABBARB NONE DETECTED 05/18/2012 1210     RADIOLOGY STUDIES: Dg Abdomen 1 View  Result Date: 07/31/2018 CLINICAL DATA:  Bleach ingestion, abdominal pain EXAM: ABDOMEN - 1 VIEW COMPARISON:  07/19/2018 abdominal radiograph FINDINGS: No dilated small bowel loops. No evidence pneumatosis or pneumoperitoneum. Minimal colonic stool. No radiopaque nephrolithiasis. IMPRESSION: Nonobstructive bowel gas pattern. No evidence pneumoperitoneum on this supine view. Electronically Signed   By: Ilona Sorrel M.D.   On: 07/31/2018 09:05   Dg Chest Portable 1 View  Result Date: 07/31/2018 CLINICAL DATA:  Weakness.  Altered mental status EXAM: PORTABLE CHEST 1 VIEW COMPARISON:  07/19/2018 FINDINGS: The heart size and mediastinal contours are within normal limits. Both lungs are clear. The visualized skeletal structures are unremarkable. IMPRESSION: No active disease. Electronically Signed   By: Kerby Moors M.D.   On: 07/31/2018 09:05      IMPRESSION:   *   Crohn's disease of the large and small bowel.  Chronic abdominal pain currently improved with patient taking prednisone 30 mg a day (his decision). Acute bloody diarrhea  this morning after ingesting bleach. Recent CTAP showed colitis in the descending colon.  *    AIH.  Elevated alkaline phosphatase during admission 2 weeks ago.  CTAP showed stable changes in the liver.  *    Depression, severe.  Patient unable to tolerate psychiatric meds, most recently unable to  tolerate Remeron Solutab.    *    DNR.  Palliative care consultation during recent admission.   PLAN:     *    At this point I do not think we need to repeat CT or abdominal imaging.  *   Continue prednisone 30 mg/day.  Doubt patient would be agreeable to using Solu-Medrol and given his reluctance to take any meds for his Crohn's disease other than the prednisone, I do not see the value in treating with IV Solu-Medrol  *   Patient has a psychiatric telemetry evaluation pending.  *   ? Would pt be agreeable to draining the perirectal abscess, if so could ask surgeon to eval for this.    Azucena Freed  07/31/2018, 9:15 AM Phone [phone]

## 2018-07-31 NOTE — BH Assessment (Addendum)
Assessment Note  [name] is an 53 y.o. male that presents this date with S/I. Patient ingested 8 oz of bleach earlier this date in an attempt to take his life. Patient denies any H/I or AVH. Patient is oriented x 4 and speaks in a low soft voice that is inaudible at times. Patient has a history of Crohn's disease, autoimmune hepatitis, suicide attempts, Grovers disease and distant alcohol abuse. Patient states he drank 8 ounces of household bleach mixed with 8 ounces of milk around 12:30 AM this morning. He also took 0.56m klonopin at 4pm, 7pm and 11pm last night. He states, "I just do not want to do it anymore. I cannot be here anymore. I do not want to live anymore." Patient reports two prior attempts at self harm at his residence last month by attempting to cut himself and a overdose in 2015. Patient will not elaborate on those events and renders limited history. Patient denies he received any medical care for that event and states "He just woke up." Patient denies any current SA issues although reports a history of alcohol use 20 years. Denies drug use. Patient per notes has a history of Crohns disease, autoimmune hepatitis and  depression admitted on 07/19/2018 with worsening abdominal pain, diarrhea, and nausea. He was suppose to be transitioned to home with hospice support. Although he then improved and revoked his hospice benefits. Patient voices he no longer desires treatment and wants palliative care. Palliative consulted on 07/19/18 for goals of care. Per chart review on 07/20/18 NMariea ClontsDO was consulted from psychiatry who notes, "Patient was admitted with Crohn's disease in the setting of noncompliance with medications. He self tapered Prednisone down to 5 mg because he thought that it was causing worsening abdominal pain. He was taking Remeron for depression but discontinued it because he also thought it was causing worsening abdominal pain. Treatment has been complicated by severe depression  and anxiety. He is requesting more palliative options secondary to depression. He has a history of hospice care several years ago but he improved and did well for a few days. On interview, Mr. SHoglundreports that his depression is circumstantial due to worsening symptoms of his medical condition since January. He was placed on disability in 2011. He was previously very active in biking, golfing and an active member of [employer]. He reports that he experienced worsening symptoms of his Crohn's disease 6 years ago and he chose palliative care treatment. He reports, "I am tired of all of the medications and side effects. I do not want iron infusions. I have chronic gas." He reports that he is limited to the foods that he can eat. He reports a slight improvement in medication side effects since self-tapering his medications. He denied SI, HI or AVH". Patient declined any medical management for depression at that time and was cleared by psychiatry. Patient denies having a current psychiatric OP provider. Patient is unclear in reference to what services he is requesting this date although states "he just wants to end it." Case was staffed with LBobby RumpfFNP who recommended a inpatient admission to assist with stabilization.   Diagnosis: F33.2  MDD recurrent without psychotic features, severe  Past Medical History:  Past Medical History:  Diagnosis Date  . Allergy   . Anal fistula   . Anxiety   . 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 [place]immune hepatitis ([place] 01[place]  . Avoidant-restrictive food intake disorder (ARFID) ? 06/15/2018  . Cancer  of skin of neck   . Cataract   . Chronic mesenteric ischemia ([place])   . Chronic pain syndrome 07/09/2016  . Crohn's disease of small and large intestines ([place])    followed by dr Glendell Docker gessner  . Dairy product intolerance   . Diarrhea, functional   . Family history of adverse reaction to anesthesia   . Grover's disease    transient acantholytic  dermatosis  . History of alcohol abuse   . History of basal cell carcinoma excision    2013 left leg  . History of Clostridium difficile    10/ 2014  . History of multiple concussions    x6   last one Jan 2017 per pt--  no residual  . History of substance abuse ([place])    quit 1997 per pt  . History of suicide attempt    05-18-2012  overdose /  failure to thrive  . Hypercholesterolemia   . Iron deficiency anemia due to chronic blood loss   . Major depression, recurrent, chronic ([place])   . Osteopenia   . Personal history of adenomatous colonic polyps 12/2010, 03/2012   12/2010 - 8 mm serrated adenoma of rectum  . Portal vein thrombosis 03/21/2015   right  . Primary sclerosing cholangitis    ? hepatitis overlap - liver bx x 2 and MRCP  . Seasonal allergies   . Substance abuse ([place]) 1997   Alcohol  . Vitamin A deficiency 05/23/2018  . Vitamin B6 deficiency 07/27/2018  . Vitamin C deficiency 05/23/2018    Past Surgical History:  Procedure Laterality Date  . ABDOMINAL AORTAGRAM N/A 03/29/2012   Procedure: ABDOMINAL Maxcine Ham;  Surgeon: Serafina Mitchell, MD;  Location: [facility] CATH LAB;  Service: Cardiovascular;  Laterality: N/A;  . ADENOIDECTOMY  age 53  . BIOPSY  05/31/2018   Procedure: BIOPSY;  Surgeon: Milus Banister, MD;  Location: [facility];  Service: Endoscopy;;  . CATARACT EXTRACTION W/ INTRAOCULAR LENS  IMPLANT, BILATERAL  2009  . COLONOSCOPY  2001, 05/02/2003, 01/28/11   2012: Right colon Crohn's, rectal polyp  . COLONOSCOPY  03/31/2012   Procedure: COLONOSCOPY;  Surgeon: Jerene Bears, MD;  Location: [facility];  Service: Gastroenterology;  Laterality: N/A;  . COLONOSCOPY WITH PROPOFOL N/A 05/31/2018   Procedure: COLONOSCOPY WITH PROPOFOL;  Surgeon: Milus Banister, MD;  Location: [facility] ENDOSCOPY;  Service: Endoscopy;  Laterality: N/A;  . ESOPHAGOGASTRODUODENOSCOPY  01/28/11   Normal  . FOOT SURGERY Right age 62  . MOHS SURGERY Left 11/2013   left ankle parakerotosis   .  PERCUTANEOUS LIVER BIOPSY  2007 and 2008  . PILONIDAL CYST EXCISION  age 67  . PLACEMENT OF SETON N/A 11/06/2015   Procedure: PLACEMENT OF SETON;  Surgeon: [name], MD;  Location: [facility];  Service: General;  Laterality: N/A;  . UPPER GASTROINTESTINAL ENDOSCOPY      Family History:  Family History  Problem Relation Age of Onset  . Heart disease Father   . Thyroid cancer Father   . Allergies Father   . Clotting disorder Father   . Breast cancer Mother   . Stomach cancer Mother   . Colon cancer Neg Hx   . Esophageal cancer Neg Hx   . Rectal cancer Neg Hx     Social History:  reports that he quit smoking about 21 years ago. His smoking use included cigarettes. He has a 15.00 pack-year smoking history. He has never used smokeless tobacco. He reports that he does not  drink alcohol or use drugs.  Additional Social History:  Alcohol / Drug Use Pain Medications: na Prescriptions: na Over the Counter: na History of alcohol / drug use?: No history of alcohol / drug abuse  CIWA: CIWA-Ar BP: (!) 153/100 Pulse Rate: 78 COWS:    Allergies:  Allergies  Allergen Reactions  . Asacol [Mesalamine] Diarrhea    abd pain  . Flagyl [Metronidazole] Diarrhea    abd pain  . Azathioprine Diarrhea    abd pain  . Gluten Meal Diarrhea  . Mycophenolate Mofetil Diarrhea    abd pain  . Other     NUTS; constipation  . Wheat Bran Diarrhea    Constipation; flatulence; abd pain  . Tape Itching and Rash    Please use "paper" tape    Home Medications: (Not in a hospital admission)   OB/GYN Status:  No LMP for male patient.  General Assessment Data Assessment unable to be completed: Yes Reason for not completing assessment: (multiple assessments) Location of Assessment: [facility] ED TTS Assessment: In system Is this a Tele or Face-to-Face Assessment?: Tele Assessment Is this an Initial Assessment or a Re-assessment for this encounter?: Initial Assessment Language Other than  English: No Living Arrangements: Other (Comment)(Family) What gender do you identify as?: Male Marital status: Single Living Arrangements: Parent Can pt return to current living arrangement?: Yes Admission Status: Voluntary Is patient capable of signing voluntary admission?: Yes Referral Source: Self/Family/Friend Insurance type: Medicaid  Medical Screening Exam ([place]) Medical Exam completed: Yes  Crisis Care Plan Living Arrangements: Parent Legal Guardian: (NA) Name of Psychiatrist: None Name of Therapist: None  Education Status Is patient currently in school?: No Is the patient employed, unemployed or receiving disability?: Unemployed  Risk to self with the past 6 months Suicidal Ideation: Yes-Currently Present Has patient been a risk to self within the past 6 months prior to admission? : Yes Suicidal Intent: Yes-Currently Present Has patient had any suicidal intent within the past 6 months prior to admission? : Yes Is patient at risk for suicide?: Yes Suicidal Plan?: Yes-Currently Present Has patient had any suicidal plan within the past 6 months prior to admission? : Yes Specify Current Suicidal Plan: Drank bleach Access to Means: Yes Specify Access to Suicidal Means: Drank bleach What has been your use of drugs/alcohol within the last 12 months?: Denies Previous Attempts/Gestures: Yes How many times?: 2 Other Self Harm Risks: (Health issues) Triggers for Past Attempts: (Health related) Intentional Self Injurious Behavior: None Family Suicide History: No Recent stressful life event(s): Other (Comment)(Health issues ) Persecutory voices/beliefs?: No Depression: Yes Depression Symptoms: Fatigue, Guilt, Feeling worthless/self pity Substance abuse history and/or treatment for substance abuse?: No Suicide prevention information given to non-admitted patients: Not applicable  Risk to Others within the past 6 months Homicidal Ideation: No Does patient have  any lifetime risk of violence toward others beyond the six months prior to admission? : No Thoughts of Harm to Others: No Current Homicidal Intent: No Current Homicidal Plan: No Access to Homicidal Means: No Identified Victim: NA History of harm to others?: No Assessment of Violence: None Noted Violent Behavior Description: NA Does patient have access to weapons?: No Criminal Charges Pending?: No Does patient have a court date: No Is patient on probation?: No  Psychosis Hallucinations: None noted Delusions: None noted  Mental Status Report Appearance/Hygiene: Unremarkable Eye Contact: Fair Motor Activity: Unremarkable Speech: Soft, Slow Level of Consciousness: Drowsy Mood: Depressed Affect: Sad, Flat Anxiety Level: Minimal Thought Processes: Thought Blocking  Judgement: Impaired Orientation: Person, Place, Time, Situation Obsessive Compulsive Thoughts/Behaviors: None  Cognitive Functioning Concentration: Decreased Memory: Recent Intact, Remote Intact Is patient IDD: No Insight: Fair Impulse Control: Poor Appetite: Poor Have you had any weight changes? : No Change Sleep: No Change Total Hours of Sleep: 4 Vegetative Symptoms: None  ADLScreening [facility] Assessment Services) Patient's cognitive ability adequate to safely complete daily activities?: Yes Patient able to express need for assistance with ADLs?: Yes Independently performs ADLs?: Yes (appropriate for developmental age)  Prior Inpatient Therapy Prior Inpatient Therapy: Yes Prior Therapy Dates: 2020 Prior Therapy Facilty/Provider(s): [facility] Reason for Treatment: S/I  Prior Outpatient Therapy Prior Outpatient Therapy: No Does patient have an ACCT team?: No Does patient have Intensive In-House Services?  : No Does patient have [facility] services? : No Does patient have P4CC services?: No  ADL Screening (condition at time of admission) Patient's cognitive ability adequate to safely complete daily activities?:  Yes Is the patient deaf or have difficulty hearing?: No Does the patient have difficulty seeing, even when wearing glasses/contacts?: No Does the patient have difficulty concentrating, remembering, or making decisions?: No Patient able to express need for assistance with ADLs?: Yes Does the patient have difficulty dressing or bathing?: No Independently performs ADLs?: Yes (appropriate for developmental age) Does the patient have difficulty walking or climbing stairs?: No Weakness of Legs: None Weakness of Arms/Hands: None  Home Assistive Devices/Equipment Home Assistive Devices/Equipment: None  Therapy Consults (therapy consults require a physician order) PT Evaluation Needed: No OT Evalulation Needed: No SLP Evaluation Needed: No Abuse/Neglect Assessment (Assessment to be complete while patient is alone) Physical Abuse: Denies Verbal Abuse: Denies Sexual Abuse: Denies Exploitation of patient/patient's resources: Denies Self-Neglect: Denies Values / Beliefs Cultural Requests During Hospitalization: None Spiritual Requests During Hospitalization: None Consults Spiritual Care Consult Needed: No Social Work Consult Needed: No Regulatory affairs officer (For Healthcare) Does Patient Have a Medical Advance Directive?: No Would patient like information on creating a medical advance directive?: No - Patient declined          Disposition: Case was staffed with Bobby Rumpf FNP who recommended a inpatient admission to assist with stabilization.   Disposition Initial Assessment Completed for this Encounter: Yes Disposition of Patient: Admit Type of inpatient treatment program: Adult Patient refused recommended treatment: No Mode of transportation if patient is discharged/movement?: Tomasita Crumble)  On Site Evaluation by:   Reviewed with Physician:    Mamie Nick 07/31/2018 10:56 AM

## 2018-07-31 NOTE — ED Notes (Signed)
TTS at bedside. 

## 2018-07-31 NOTE — ED Provider Notes (Signed)
4:14 PM Pt here with severe depression, SI, drank bleach last night. Seen by GI and cleared. No airway problems. Seen by psych and inpt treatment recommended. K is low with normal EKG. Likely chronic hypokalemia -- protein, albumin very low as well. Unfortunately patient will not take PO meds because of his depression. Awaiting placement. Currently voluntary.   BP 140/88   Pulse 72   Temp 97.6 F (36.4 C) (Oral)   Resp 11   Ht 6' 2"  (1.88 m)   Wt 63.5 kg   SpO2 100%   BMI 17.97 kg/m   7:24 PM Called to patient room. Pt requesting IV pain medication. I offered his prescribed oral medications and declined parenteral meds as he is pending placement and will need to be maintained on his home oral medications.  Patient is concerned that it will upset his stomach.  After leaving the room, patient requesting oral pain medication, prednisone, [place] sandwich.  Ordered.  BP 138/77 (BP Location: Right Arm)   Pulse 68   Temp 98 F (36.7 C) (Oral)   Resp 11   Ht 6' 2"  (1.88 m)   Wt 63.5 kg   SpO2 100%   BMI 17.97 kg/m    12:25 AM Pt accepted to [facility].  IVC completed by Dr. Wilson Singer.   BP 138/77 (BP Location: Right Arm)   Pulse 68   Temp 98 F (36.7 C) (Oral)   Resp 11   Ht 6' 2"  (1.88 m)   Wt 63.5 kg   SpO2 100%   BMI 17.97 kg/m     Carlisle Cater, PA-C 08/01/18 0026    Virgel Manifold, MD 08/01/18 0028

## 2018-07-31 NOTE — ED Notes (Signed)
PA at bedside with pt to discuss plan of care. Pt is cleared from poison control standpoint per PA

## 2018-07-31 NOTE — Care Management (Signed)
ED CM reviewed patient's record, patient is awaiting placement at [facility] tomorrow.

## 2018-07-31 NOTE — ED Notes (Signed)
Pt currently on bedpan. Sitter at bedside

## 2018-07-31 NOTE — ED Notes (Signed)
PCXR at bedside.

## 2018-07-31 NOTE — Progress Notes (Signed)
Palliative Medicine RN Note: Consult order noted. Discussed at length with Dr Hilma Favors, PMT Medical Director. Unfortunately, PMT does not have a role in [name]'s care until he is stabilized from a psychiatric standpoint. He was referred to hospice on 5/22 and accepted enrollment; however, once he got home he refused care from both hospice and palliative arms of [facility].   If he stabilizes from a psychiatric standpoint and would like to discuss his goals of care, please reconsult our team. At this time, the consult will be cancelled.  [name] [name] Dorval, RN, BSN, [facility] Palliative Medicine Team 07/31/2018 4:40 PM Office [phone]

## 2018-07-31 NOTE — ED Triage Notes (Signed)
Pt arrives [facility] from homw with c/o drinking 8 ounces of bleach and 3 0.5 mg clonopin at mifnight last night. Pt is alert. C/o abdominal pain. Hx of crohns and previous suicide attempts.

## 2018-07-31 NOTE — ED Notes (Addendum)
Per [facility] pt arrive from with c/o drinking approx 8 oz of bleach at midnight last night. Also took 3 0.13m  Clonopin. Pt now c/o abdominmal pain and bloating Hx of crohns. Hx of previous suicide attempts.

## 2018-07-31 NOTE — ED Notes (Signed)
Pt states 6+ Diarrhea stools in last 24 hours with blood noted in stool. Denies vomiting.

## 2018-07-31 NOTE — ED Provider Notes (Signed)
  3:45 PM RN in psych hold area informs me patient is refusing to take his ordered medications; i.e. home prednisone and Klonopin as well as potassium supplementation due to hypokalemia.  After speaking with the patient again at length, it seems as though he has such decompensated depression and hopelessness that he has no desire to help himself. He states he cannot take most oral medications because they exacerbate his Crohn's. He adds, "What's the point anyway nothing will work.  Nothing has worked.  That is why I want to kill myself."  I have put in consults for psychiatry to obtain recommendations for IM occasions to treat his depression.  I also put in a consult for palliative care due to the persistent pain and decreased quality of life associated with his severe Crohn's.   Lorayne Bender, PA-C 07/31/18 1632    Pattricia Boss, MD 08/02/18 [phone]

## 2018-08-01 DIAGNOSIS — K838 Other specified diseases of biliary tract: Secondary | ICD-10-CM | POA: Diagnosis not present

## 2018-08-01 DIAGNOSIS — F332 Major depressive disorder, recurrent severe without psychotic features: Secondary | ICD-10-CM | POA: Diagnosis not present

## 2018-08-01 DIAGNOSIS — K5090 Crohn's disease, unspecified, without complications: Secondary | ICD-10-CM | POA: Diagnosis not present

## 2018-08-01 DIAGNOSIS — Z20828 Contact with and (suspected) exposure to other viral communicable diseases: Secondary | ICD-10-CM | POA: Diagnosis not present

## 2018-08-01 DIAGNOSIS — Z1159 Encounter for screening for other viral diseases: Secondary | ICD-10-CM | POA: Diagnosis not present

## 2018-08-01 DIAGNOSIS — R1084 Generalized abdominal pain: Secondary | ICD-10-CM | POA: Diagnosis not present

## 2018-08-01 DIAGNOSIS — E161 Other hypoglycemia: Secondary | ICD-10-CM | POA: Diagnosis not present

## 2018-08-01 DIAGNOSIS — Z915 Personal history of self-harm: Secondary | ICD-10-CM | POA: Diagnosis not present

## 2018-08-01 DIAGNOSIS — K50119 Crohn's disease of large intestine with unspecified complications: Secondary | ICD-10-CM | POA: Diagnosis not present

## 2018-08-01 DIAGNOSIS — E162 Hypoglycemia, unspecified: Secondary | ICD-10-CM | POA: Diagnosis not present

## 2018-08-01 DIAGNOSIS — R45851 Suicidal ideations: Secondary | ICD-10-CM | POA: Diagnosis present

## 2018-08-01 DIAGNOSIS — K922 Gastrointestinal hemorrhage, unspecified: Secondary | ICD-10-CM | POA: Diagnosis not present

## 2018-08-01 DIAGNOSIS — R627 Adult failure to thrive: Secondary | ICD-10-CM | POA: Diagnosis not present

## 2018-08-01 DIAGNOSIS — T543X2A Toxic effect of corrosive alkalis and alkali-like substances, intentional self-harm, initial encounter: Secondary | ICD-10-CM | POA: Diagnosis not present

## 2018-08-01 DIAGNOSIS — R531 Weakness: Secondary | ICD-10-CM | POA: Diagnosis not present

## 2018-08-01 DIAGNOSIS — K7290 Hepatic failure, unspecified without coma: Secondary | ICD-10-CM | POA: Diagnosis not present

## 2018-08-01 DIAGNOSIS — K50011 Crohn's disease of small intestine with rectal bleeding: Secondary | ICD-10-CM | POA: Diagnosis not present

## 2018-08-01 DIAGNOSIS — I10 Essential (primary) hypertension: Secondary | ICD-10-CM | POA: Diagnosis not present

## 2018-08-01 DIAGNOSIS — E860 Dehydration: Secondary | ICD-10-CM | POA: Diagnosis not present

## 2018-08-01 DIAGNOSIS — K6389 Other specified diseases of intestine: Secondary | ICD-10-CM | POA: Diagnosis not present

## 2018-08-01 DIAGNOSIS — K611 Rectal abscess: Secondary | ICD-10-CM | POA: Diagnosis not present

## 2018-08-01 DIAGNOSIS — Z79899 Other long term (current) drug therapy: Secondary | ICD-10-CM | POA: Diagnosis not present

## 2018-08-01 DIAGNOSIS — F322 Major depressive disorder, single episode, severe without psychotic features: Secondary | ICD-10-CM | POA: Diagnosis not present

## 2018-08-01 DIAGNOSIS — K625 Hemorrhage of anus and rectum: Secondary | ICD-10-CM | POA: Diagnosis not present

## 2018-08-01 DIAGNOSIS — K921 Melena: Secondary | ICD-10-CM | POA: Diagnosis not present

## 2018-08-01 DIAGNOSIS — K50919 Crohn's disease, unspecified, with unspecified complications: Secondary | ICD-10-CM | POA: Diagnosis not present

## 2018-08-01 MED ORDER — PREDNISONE 20 MG PO TABS: 30.0000 mg | ORAL_TABLET | ORAL | Status: DC

## 2018-08-01 NOTE — ED Provider Notes (Signed)
Emergency Medicine Observation Re-evaluation Note  [name] is a 53 y.o. male, seen on rounds today.  Pt initially presented to the ED for complaints of Suicidal and Drug Overdose Currently, the patient is resting comfortably in bed.  He reports concerns about his upcoming transfer to [facility] stating that he will probably need a water bottle, urinal, and multiple bathroom stops on the way.  He denies any other concerns or symptoms..  Physical Exam  BP (!) 144/85 (BP Location: Right Arm)   Pulse 67   Temp 97.9 F (36.6 C) (Oral)   Resp 16   Ht 6' 2"  (1.88 m)   Wt 63.5 kg   SpO2 98%   BMI 17.97 kg/m  Physical Exam Vitals signs reviewed.  Constitutional:      General: He is not in acute distress.    Comments: Thin  Neurological:     Mental Status: He is alert.  Psychiatric:        Mood and Affect: Affect is blunt and flat.        Behavior: Behavior is slowed and withdrawn.     Comments: Poor eye contact     ED Course / MDM  EKG:EKG Interpretation  Date/Time:  Monday July 31 2018 08:31:24 EDT Ventricular Rate:  72 PR Interval:    QRS Duration: 99 QT Interval:  380 QTC Calculation: 416 R Axis:   46 Text Interpretation:  Normal sinus rhythm Poor data quality Non-specific ST-t changes Confirmed by Pattricia Boss [phone]) on 07/31/2018 11:15:51 AM  Clinical Course as of Jul 31 1024  Mon Jul 31, 2018  0810 Spoke with Eustaquio Maize with [facility] Poison Control.  Basic labs, APAP level. GI consult if any abdominal or throat discomfort. CXR and KUB.  6-hour observation recommended.   [SJ]  9977 Spoke with Azucena Freed, PA with [facility]. They will see the patient.   [SJ]  1410 Consistent with previous values.  Calcium(!): 8.0 [SJ]    Clinical Course User Index [SJ] Joy, Shawn C, PA-C   I have reviewed the labs performed to date as well as medications administered while in observation.  Recent changes in the last 24 hours include none since H/P. Plan  Current plan is for  [name] to [name] today.  Dr. Jonelle Sports is accepting physician. Patient is under full IVC at this time.   [name] Glass, PA-C 08/01/18 1028    Fredia Sorrow, MD 08/02/18 1929

## 2018-08-01 NOTE — ED Notes (Signed)
Patient currently refusing to eat breakfast tray. Patient states he feels disappointed with currently being in the hospital. RN attempted to give patient his prednisone. Patient states he usually takes it at 8pm daily. Pharmacy contacted regarding scheduled time. Patient resting in bed comfortably.

## 2018-08-01 NOTE — ED Notes (Signed)
[facility] was made aware of open wound to patients' buttocks; pt has concerns about going to [facility] due to long drive and issues with his back and open buttocks wound; [facility] will need to be contacted to confirm if patient still about to go with open wound-Monique,RN

## 2018-08-01 NOTE — ED Notes (Signed)
Patient was advised of IVC and Bed at [facility]; patient states "I guess this is how I'm bout to die"; Pt states he is concerned about his special diet and special water he drinks; Pt states he will call his parents in the morning to inform them of disp-Monique,RN

## 2018-08-01 NOTE — ED Notes (Signed)
[facility] )called @ 1012-per Joellen Jersey, RN called by Levada Dy

## 2018-08-01 NOTE — ED Notes (Signed)
Sheriff at bedside to transport patient to [facility].

## 2018-08-02 ENCOUNTER — Other Ambulatory Visit: Payer: Self-pay | Admitting: Internal Medicine

## 2018-08-02 DIAGNOSIS — R627 Adult failure to thrive: Secondary | ICD-10-CM | POA: Diagnosis not present

## 2018-08-02 DIAGNOSIS — K7290 Hepatic failure, unspecified without coma: Secondary | ICD-10-CM | POA: Diagnosis not present

## 2018-08-02 DIAGNOSIS — R1084 Generalized abdominal pain: Secondary | ICD-10-CM | POA: Diagnosis not present

## 2018-08-02 DIAGNOSIS — K625 Hemorrhage of anus and rectum: Secondary | ICD-10-CM | POA: Diagnosis not present

## 2018-08-02 DIAGNOSIS — K838 Other specified diseases of biliary tract: Secondary | ICD-10-CM | POA: Diagnosis not present

## 2018-08-02 DIAGNOSIS — K6389 Other specified diseases of intestine: Secondary | ICD-10-CM | POA: Diagnosis not present

## 2018-08-02 DIAGNOSIS — K611 Rectal abscess: Secondary | ICD-10-CM | POA: Diagnosis not present

## 2018-08-02 DIAGNOSIS — R531 Weakness: Secondary | ICD-10-CM | POA: Diagnosis not present

## 2018-08-02 DIAGNOSIS — K50119 Crohn's disease of large intestine with unspecified complications: Secondary | ICD-10-CM | POA: Diagnosis not present

## 2018-08-02 DIAGNOSIS — K5090 Crohn's disease, unspecified, without complications: Secondary | ICD-10-CM | POA: Insufficient documentation

## 2018-08-02 DIAGNOSIS — F332 Major depressive disorder, recurrent severe without psychotic features: Secondary | ICD-10-CM | POA: Diagnosis not present

## 2018-08-02 DIAGNOSIS — K50919 Crohn's disease, unspecified, with unspecified complications: Secondary | ICD-10-CM | POA: Diagnosis not present

## 2018-08-02 NOTE — Telephone Encounter (Signed)
May I refill Sir?

## 2018-08-02 NOTE — Telephone Encounter (Signed)
[name] went to inpatient psychiatry - unless he is back home?  Please find out

## 2018-08-03 DIAGNOSIS — F332 Major depressive disorder, recurrent severe without psychotic features: Secondary | ICD-10-CM | POA: Diagnosis not present

## 2018-08-03 DIAGNOSIS — K5000 Crohn's disease of small intestine without complications: Secondary | ICD-10-CM | POA: Diagnosis not present

## 2018-08-03 DIAGNOSIS — R627 Adult failure to thrive: Secondary | ICD-10-CM | POA: Diagnosis not present

## 2018-08-03 NOTE — Telephone Encounter (Signed)
I spoke with his parents and he is at [facility] still. Dad said the computer must have generated this request so I will deny it.

## 2018-08-04 ENCOUNTER — Other Ambulatory Visit: Payer: Self-pay | Admitting: *Deleted

## 2018-08-04 DIAGNOSIS — F332 Major depressive disorder, recurrent severe without psychotic features: Secondary | ICD-10-CM | POA: Diagnosis not present

## 2018-08-04 DIAGNOSIS — K5000 Crohn's disease of small intestine without complications: Secondary | ICD-10-CM | POA: Diagnosis not present

## 2018-08-04 DIAGNOSIS — K5090 Crohn's disease, unspecified, without complications: Secondary | ICD-10-CM | POA: Diagnosis not present

## 2018-08-04 DIAGNOSIS — K611 Rectal abscess: Secondary | ICD-10-CM | POA: Diagnosis not present

## 2018-08-04 DIAGNOSIS — R627 Adult failure to thrive: Secondary | ICD-10-CM | POA: Diagnosis not present

## 2018-08-04 DIAGNOSIS — K50814 Crohn's disease of both small and large intestine with abscess: Secondary | ICD-10-CM | POA: Diagnosis not present

## 2018-08-04 NOTE — Patient Outreach (Signed)
[facility]) Care Management  08/04/2018  [name] 1965/12/02 [id number]   CSW made an initial attempt to try and contact patient today to perform the initial phone assessment, as well as assess and assist with social work needs and services, without success.  HIPAA compliant messages were left for patient on voicemail at home, as well as on patient's cell phone.  CSW is currently awaiting a return call.   After thorough review of patient's electronic medical record in Epic, [place] noted that patient was transferred from the [facility] to [facility] on Monday, July 31, 2018, due to Depression and Suicidal Ideations.  CSW attempted to contact patient's social worker at [facility] but was unsuccessful, due to not having a "patient identification number".  CSW then checked under Patient [name] to learn that patient was transferred from [facility] to [facility] in [place], [place] on Wednesday, August 02, 2018, where patient continues to reside.  Patient is currently being treated for Major Depressive Disorder, Recurrent, Severe without Psychotic Features, Suicidal Ideations and Toxic Effect of Corrosive Alkalis and Alkali-like Substances, Intentional Self-harm.  CSW will continue to follow patient's progress under Patient [name], then attempt to make contact with patient once he has been discharged back home.  CSW will ensure that patient is no longer experiencing suicidal ideations and that he is able to afford to get his prescription medications filled.  CSW will also confirm that patient has been established with an outpatient therapist and psychiatrist for medication management an psychotherapy services.  Nat Christen, [name], [name], LCSW  Licensed Education officer, [facility]  [address] [place] [address] [place], [place] [address] Physical Address-[address], [place], [place] [address] Toll Free Main # [phone] Fax # [phone] Cell # [phone]  Office # [phone] [email or web address]

## 2018-08-05 DIAGNOSIS — F332 Major depressive disorder, recurrent severe without psychotic features: Secondary | ICD-10-CM | POA: Diagnosis not present

## 2018-08-05 DIAGNOSIS — R627 Adult failure to thrive: Secondary | ICD-10-CM | POA: Diagnosis not present

## 2018-08-05 DIAGNOSIS — K5000 Crohn's disease of small intestine without complications: Secondary | ICD-10-CM | POA: Diagnosis not present

## 2018-08-06 DIAGNOSIS — F332 Major depressive disorder, recurrent severe without psychotic features: Secondary | ICD-10-CM | POA: Diagnosis not present

## 2018-08-06 DIAGNOSIS — K5000 Crohn's disease of small intestine without complications: Secondary | ICD-10-CM | POA: Diagnosis not present

## 2018-08-06 DIAGNOSIS — R627 Adult failure to thrive: Secondary | ICD-10-CM | POA: Diagnosis not present

## 2018-08-07 DIAGNOSIS — R531 Weakness: Secondary | ICD-10-CM | POA: Diagnosis not present

## 2018-08-07 DIAGNOSIS — K5000 Crohn's disease of small intestine without complications: Secondary | ICD-10-CM | POA: Diagnosis not present

## 2018-08-07 DIAGNOSIS — R627 Adult failure to thrive: Secondary | ICD-10-CM | POA: Diagnosis not present

## 2018-08-07 DIAGNOSIS — F332 Major depressive disorder, recurrent severe without psychotic features: Secondary | ICD-10-CM | POA: Diagnosis not present

## 2018-08-07 DIAGNOSIS — K50019 Crohn's disease of small intestine with unspecified complications: Secondary | ICD-10-CM | POA: Diagnosis not present

## 2018-08-07 DIAGNOSIS — K50819 Crohn's disease of both small and large intestine with unspecified complications: Secondary | ICD-10-CM | POA: Diagnosis not present

## 2018-08-08 DIAGNOSIS — R531 Weakness: Secondary | ICD-10-CM | POA: Diagnosis not present

## 2018-08-08 DIAGNOSIS — R627 Adult failure to thrive: Secondary | ICD-10-CM | POA: Diagnosis not present

## 2018-08-08 DIAGNOSIS — K5000 Crohn's disease of small intestine without complications: Secondary | ICD-10-CM | POA: Diagnosis not present

## 2018-08-08 DIAGNOSIS — K50819 Crohn's disease of both small and large intestine with unspecified complications: Secondary | ICD-10-CM | POA: Diagnosis not present

## 2018-08-08 DIAGNOSIS — K50014 Crohn's disease of small intestine with abscess: Secondary | ICD-10-CM | POA: Diagnosis not present

## 2018-08-08 DIAGNOSIS — K50019 Crohn's disease of small intestine with unspecified complications: Secondary | ICD-10-CM | POA: Diagnosis not present

## 2018-08-08 DIAGNOSIS — F332 Major depressive disorder, recurrent severe without psychotic features: Secondary | ICD-10-CM | POA: Diagnosis not present

## 2018-08-08 DIAGNOSIS — K611 Rectal abscess: Secondary | ICD-10-CM | POA: Diagnosis not present

## 2018-08-08 MED ORDER — ACETAMINOPHEN 325 MG PO TABS
650.00 | ORAL_TABLET | ORAL | Status: DC
Start: ? — End: 2018-08-08

## 2018-08-08 MED ORDER — CLONAZEPAM 0.5 MG PO TABS
.50 | ORAL_TABLET | ORAL | Status: DC
Start: 2018-08-09 — End: 2018-08-08

## 2018-08-08 MED ORDER — FERROUS SULFATE 324 (65 FE) MG PO TBEC
324.00 | DELAYED_RELEASE_TABLET | ORAL | Status: DC
Start: 2018-08-09 — End: 2018-08-08

## 2018-08-08 MED ORDER — ONDANSETRON HCL 4 MG/2ML IJ SOLN
4.00 | INTRAMUSCULAR | Status: DC
Start: ? — End: 2018-08-08

## 2018-08-08 MED ORDER — FOLIC ACID 1 MG PO TABS
1.00 | ORAL_TABLET | ORAL | Status: DC
Start: 2018-08-20 — End: 2018-08-08

## 2018-08-08 MED ORDER — DICYCLOMINE HCL 10 MG PO CAPS
20.00 | ORAL_CAPSULE | ORAL | Status: DC
Start: 2018-08-08 — End: 2018-08-08

## 2018-08-08 MED ORDER — MIRTAZAPINE 15 MG PO TABS
30.00 | ORAL_TABLET | ORAL | Status: DC
Start: 2018-08-08 — End: 2018-08-08

## 2018-08-08 MED ORDER — OLANZAPINE 2.5 MG PO TABS
2.50 | ORAL_TABLET | ORAL | Status: DC
Start: 2018-08-19 — End: 2018-08-08

## 2018-08-08 MED ORDER — MELATONIN 3 MG PO TABS
3.00 | ORAL_TABLET | ORAL | Status: DC
Start: ? — End: 2018-08-08

## 2018-08-08 MED ORDER — GENERIC EXTERNAL MEDICATION
Status: DC
Start: ? — End: 2018-08-08

## 2018-08-08 MED ORDER — PREDNISONE 20 MG PO TABS
20.00 | ORAL_TABLET | ORAL | Status: DC
Start: 2018-08-20 — End: 2018-08-08

## 2018-08-08 MED ORDER — CIPROFLOXACIN HCL 500 MG PO TABS
500.00 | ORAL_TABLET | ORAL | Status: DC
Start: 2018-08-08 — End: 2018-08-08

## 2018-08-08 MED ORDER — METRONIDAZOLE 500 MG PO TABS
500.00 | ORAL_TABLET | ORAL | Status: DC
Start: 2018-08-09 — End: 2018-08-08

## 2018-08-09 DIAGNOSIS — R531 Weakness: Secondary | ICD-10-CM | POA: Diagnosis not present

## 2018-08-09 DIAGNOSIS — K5000 Crohn's disease of small intestine without complications: Secondary | ICD-10-CM | POA: Diagnosis not present

## 2018-08-09 DIAGNOSIS — K50019 Crohn's disease of small intestine with unspecified complications: Secondary | ICD-10-CM | POA: Diagnosis not present

## 2018-08-09 DIAGNOSIS — R627 Adult failure to thrive: Secondary | ICD-10-CM | POA: Diagnosis not present

## 2018-08-09 DIAGNOSIS — K50819 Crohn's disease of both small and large intestine with unspecified complications: Secondary | ICD-10-CM | POA: Diagnosis not present

## 2018-08-09 DIAGNOSIS — F332 Major depressive disorder, recurrent severe without psychotic features: Secondary | ICD-10-CM | POA: Diagnosis not present

## 2018-08-10 DIAGNOSIS — F332 Major depressive disorder, recurrent severe without psychotic features: Secondary | ICD-10-CM | POA: Diagnosis not present

## 2018-08-10 DIAGNOSIS — K50819 Crohn's disease of both small and large intestine with unspecified complications: Secondary | ICD-10-CM | POA: Diagnosis not present

## 2018-08-10 DIAGNOSIS — R627 Adult failure to thrive: Secondary | ICD-10-CM | POA: Diagnosis not present

## 2018-08-10 DIAGNOSIS — K5000 Crohn's disease of small intestine without complications: Secondary | ICD-10-CM | POA: Diagnosis not present

## 2018-08-10 DIAGNOSIS — R531 Weakness: Secondary | ICD-10-CM | POA: Diagnosis not present

## 2018-08-10 DIAGNOSIS — K50019 Crohn's disease of small intestine with unspecified complications: Secondary | ICD-10-CM | POA: Diagnosis not present

## 2018-08-11 ENCOUNTER — Other Ambulatory Visit: Payer: Self-pay | Admitting: *Deleted

## 2018-08-11 DIAGNOSIS — R531 Weakness: Secondary | ICD-10-CM | POA: Diagnosis not present

## 2018-08-11 DIAGNOSIS — K5000 Crohn's disease of small intestine without complications: Secondary | ICD-10-CM | POA: Diagnosis not present

## 2018-08-11 DIAGNOSIS — K50019 Crohn's disease of small intestine with unspecified complications: Secondary | ICD-10-CM | POA: Diagnosis not present

## 2018-08-11 DIAGNOSIS — F332 Major depressive disorder, recurrent severe without psychotic features: Secondary | ICD-10-CM | POA: Diagnosis not present

## 2018-08-11 DIAGNOSIS — K50819 Crohn's disease of both small and large intestine with unspecified complications: Secondary | ICD-10-CM | POA: Diagnosis not present

## 2018-08-11 DIAGNOSIS — R627 Adult failure to thrive: Secondary | ICD-10-CM | POA: Diagnosis not present

## 2018-08-11 NOTE — Patient Outreach (Signed)
[facility]) Care Management  08/11/2018  [place] 04-Oct-1965 [id number]   After thorough review of patient's electronic medical record in [facility], as well as review of Patient [name], Carter noted that patient continues to reside at [facility] in [place], [place] to receive treatment for Major Depressive Disorder, Recurrent, Severe without Psychotic Features, Suicidal Ideations and Toxic Effect of Corrosive Alkalis and Alkali-like Substances, Intentional Self-harm.  [place], [employer] Stay 08/02/18 1:13 PM - Present  Provider:  Riley Nearing  CSW will continue to follow patient while hospitalized, as well as outreach to patient once he has been discharged back home to ensure that he has all the available outpatient resources and that he has been assigned a therapist and psychiatrist.    Nat Christen, [name], [name], [place]  Licensed Clinical Social Worker  [place]  Mailing [place] N. [address], [place], [place] [address] Physical Address-[address], [place], [place] [address] Toll Free Main # [phone] Fax # [phone] Cell # [phone]  Office # [phone] [email or web address]

## 2018-08-12 DIAGNOSIS — K50019 Crohn's disease of small intestine with unspecified complications: Secondary | ICD-10-CM | POA: Diagnosis not present

## 2018-08-12 DIAGNOSIS — F332 Major depressive disorder, recurrent severe without psychotic features: Secondary | ICD-10-CM | POA: Diagnosis not present

## 2018-08-12 DIAGNOSIS — K5000 Crohn's disease of small intestine without complications: Secondary | ICD-10-CM | POA: Diagnosis not present

## 2018-08-12 DIAGNOSIS — R627 Adult failure to thrive: Secondary | ICD-10-CM | POA: Diagnosis not present

## 2018-08-12 DIAGNOSIS — R531 Weakness: Secondary | ICD-10-CM | POA: Diagnosis not present

## 2018-08-12 DIAGNOSIS — K50819 Crohn's disease of both small and large intestine with unspecified complications: Secondary | ICD-10-CM | POA: Diagnosis not present

## 2018-08-13 DIAGNOSIS — K50019 Crohn's disease of small intestine with unspecified complications: Secondary | ICD-10-CM | POA: Diagnosis not present

## 2018-08-13 DIAGNOSIS — F332 Major depressive disorder, recurrent severe without psychotic features: Secondary | ICD-10-CM | POA: Diagnosis not present

## 2018-08-13 DIAGNOSIS — R531 Weakness: Secondary | ICD-10-CM | POA: Diagnosis not present

## 2018-08-13 DIAGNOSIS — R627 Adult failure to thrive: Secondary | ICD-10-CM | POA: Diagnosis not present

## 2018-08-13 DIAGNOSIS — K50819 Crohn's disease of both small and large intestine with unspecified complications: Secondary | ICD-10-CM | POA: Diagnosis not present

## 2018-08-13 DIAGNOSIS — K5000 Crohn's disease of small intestine without complications: Secondary | ICD-10-CM | POA: Diagnosis not present

## 2018-08-14 DIAGNOSIS — R627 Adult failure to thrive: Secondary | ICD-10-CM | POA: Diagnosis not present

## 2018-08-14 DIAGNOSIS — K50019 Crohn's disease of small intestine with unspecified complications: Secondary | ICD-10-CM | POA: Diagnosis not present

## 2018-08-14 DIAGNOSIS — K50819 Crohn's disease of both small and large intestine with unspecified complications: Secondary | ICD-10-CM | POA: Diagnosis not present

## 2018-08-14 DIAGNOSIS — K5000 Crohn's disease of small intestine without complications: Secondary | ICD-10-CM | POA: Diagnosis not present

## 2018-08-14 DIAGNOSIS — K611 Rectal abscess: Secondary | ICD-10-CM | POA: Diagnosis not present

## 2018-08-14 DIAGNOSIS — F332 Major depressive disorder, recurrent severe without psychotic features: Secondary | ICD-10-CM | POA: Diagnosis not present

## 2018-08-14 DIAGNOSIS — R531 Weakness: Secondary | ICD-10-CM | POA: Diagnosis not present

## 2018-08-15 DIAGNOSIS — F332 Major depressive disorder, recurrent severe without psychotic features: Secondary | ICD-10-CM | POA: Diagnosis not present

## 2018-08-15 DIAGNOSIS — K50019 Crohn's disease of small intestine with unspecified complications: Secondary | ICD-10-CM | POA: Diagnosis not present

## 2018-08-15 DIAGNOSIS — K50819 Crohn's disease of both small and large intestine with unspecified complications: Secondary | ICD-10-CM | POA: Diagnosis not present

## 2018-08-15 DIAGNOSIS — K611 Rectal abscess: Secondary | ICD-10-CM | POA: Diagnosis not present

## 2018-08-15 DIAGNOSIS — K838 Other specified diseases of biliary tract: Secondary | ICD-10-CM | POA: Diagnosis not present

## 2018-08-15 DIAGNOSIS — K7290 Hepatic failure, unspecified without coma: Secondary | ICD-10-CM | POA: Diagnosis not present

## 2018-08-15 DIAGNOSIS — K6389 Other specified diseases of intestine: Secondary | ICD-10-CM | POA: Diagnosis not present

## 2018-08-15 DIAGNOSIS — K610 Anal abscess: Secondary | ICD-10-CM | POA: Diagnosis not present

## 2018-08-15 DIAGNOSIS — R531 Weakness: Secondary | ICD-10-CM | POA: Diagnosis not present

## 2018-08-15 DIAGNOSIS — R627 Adult failure to thrive: Secondary | ICD-10-CM | POA: Diagnosis not present

## 2018-08-15 DIAGNOSIS — K5000 Crohn's disease of small intestine without complications: Secondary | ICD-10-CM | POA: Diagnosis not present

## 2018-08-15 DIAGNOSIS — K5090 Crohn's disease, unspecified, without complications: Secondary | ICD-10-CM | POA: Diagnosis not present

## 2018-08-16 DIAGNOSIS — F332 Major depressive disorder, recurrent severe without psychotic features: Secondary | ICD-10-CM | POA: Diagnosis not present

## 2018-08-16 DIAGNOSIS — K5090 Crohn's disease, unspecified, without complications: Secondary | ICD-10-CM | POA: Diagnosis not present

## 2018-08-16 DIAGNOSIS — K50819 Crohn's disease of both small and large intestine with unspecified complications: Secondary | ICD-10-CM | POA: Diagnosis not present

## 2018-08-16 DIAGNOSIS — K611 Rectal abscess: Secondary | ICD-10-CM | POA: Diagnosis not present

## 2018-08-16 DIAGNOSIS — R627 Adult failure to thrive: Secondary | ICD-10-CM | POA: Diagnosis not present

## 2018-08-16 DIAGNOSIS — R531 Weakness: Secondary | ICD-10-CM | POA: Diagnosis not present

## 2018-08-16 DIAGNOSIS — K50019 Crohn's disease of small intestine with unspecified complications: Secondary | ICD-10-CM | POA: Diagnosis not present

## 2018-08-16 DIAGNOSIS — K5000 Crohn's disease of small intestine without complications: Secondary | ICD-10-CM | POA: Diagnosis not present

## 2018-08-17 DIAGNOSIS — R531 Weakness: Secondary | ICD-10-CM | POA: Diagnosis not present

## 2018-08-17 DIAGNOSIS — K50819 Crohn's disease of both small and large intestine with unspecified complications: Secondary | ICD-10-CM | POA: Diagnosis not present

## 2018-08-17 DIAGNOSIS — R627 Adult failure to thrive: Secondary | ICD-10-CM | POA: Diagnosis not present

## 2018-08-17 DIAGNOSIS — K611 Rectal abscess: Secondary | ICD-10-CM | POA: Diagnosis not present

## 2018-08-17 DIAGNOSIS — F418 Other specified anxiety disorders: Secondary | ICD-10-CM | POA: Diagnosis not present

## 2018-08-17 DIAGNOSIS — F332 Major depressive disorder, recurrent severe without psychotic features: Secondary | ICD-10-CM | POA: Diagnosis not present

## 2018-08-17 DIAGNOSIS — K50019 Crohn's disease of small intestine with unspecified complications: Secondary | ICD-10-CM | POA: Diagnosis not present

## 2018-08-17 DIAGNOSIS — K5000 Crohn's disease of small intestine without complications: Secondary | ICD-10-CM | POA: Diagnosis not present

## 2018-08-17 DIAGNOSIS — K50014 Crohn's disease of small intestine with abscess: Secondary | ICD-10-CM | POA: Diagnosis not present

## 2018-08-18 ENCOUNTER — Other Ambulatory Visit: Payer: Self-pay | Admitting: *Deleted

## 2018-08-18 DIAGNOSIS — K50019 Crohn's disease of small intestine with unspecified complications: Secondary | ICD-10-CM | POA: Diagnosis not present

## 2018-08-18 DIAGNOSIS — F332 Major depressive disorder, recurrent severe without psychotic features: Secondary | ICD-10-CM | POA: Diagnosis not present

## 2018-08-18 DIAGNOSIS — K50819 Crohn's disease of both small and large intestine with unspecified complications: Secondary | ICD-10-CM | POA: Diagnosis not present

## 2018-08-18 DIAGNOSIS — K5000 Crohn's disease of small intestine without complications: Secondary | ICD-10-CM | POA: Diagnosis not present

## 2018-08-18 DIAGNOSIS — R627 Adult failure to thrive: Secondary | ICD-10-CM | POA: Diagnosis not present

## 2018-08-18 DIAGNOSIS — R531 Weakness: Secondary | ICD-10-CM | POA: Diagnosis not present

## 2018-08-18 NOTE — Patient Outreach (Signed)
[place] [facility]) Care Management  08/18/2018  [name] 01-31-1966 [id number]   11/1965 [id number]   CSW was able to confirm, via Patient [name], that patient continues to reside at [facility] in [place], [place] to receive treatment for MajorDepressiveDisorder,Recurrent, Severe withoutPsychoticFeatures,SuicidalIdeationsandToxic Effect ofCorrosiveAlkalis andAlkali-likeSubstances,IntentionalSelf-harm.  [place], [place] Stay 08/02/18 1:13 PM - Present  Provider:  Riley Nearing  CSW will continue to follow patient while hospitalized, as well as outreach to patient once he has been discharged back home to ensure that he has all the available outpatient resources needed and that he has been assigned a therapist and psychiatrist for medication management and psychotherapy services.  Nat Christen, [name], [name], LCSW  Licensed Education officer, [facility]  Mailing [place] [address]. [address], [place], [place] [address] Physical Address[address], [place],  [address] Toll Free Main # [phone] Fax # [phone] Cell # [phone]  Office # [phone] [name]

## 2018-08-19 DIAGNOSIS — K50819 Crohn's disease of both small and large intestine with unspecified complications: Secondary | ICD-10-CM | POA: Diagnosis not present

## 2018-08-19 DIAGNOSIS — K5000 Crohn's disease of small intestine without complications: Secondary | ICD-10-CM | POA: Diagnosis not present

## 2018-08-19 DIAGNOSIS — R531 Weakness: Secondary | ICD-10-CM | POA: Diagnosis not present

## 2018-08-19 DIAGNOSIS — R627 Adult failure to thrive: Secondary | ICD-10-CM | POA: Diagnosis not present

## 2018-08-19 DIAGNOSIS — K50019 Crohn's disease of small intestine with unspecified complications: Secondary | ICD-10-CM | POA: Diagnosis not present

## 2018-08-19 DIAGNOSIS — F332 Major depressive disorder, recurrent severe without psychotic features: Secondary | ICD-10-CM | POA: Diagnosis not present

## 2018-08-21 ENCOUNTER — Telehealth: Payer: Self-pay | Admitting: Internal Medicine

## 2018-08-21 ENCOUNTER — Encounter: Payer: Self-pay | Admitting: Internal Medicine

## 2018-08-21 MED ORDER — GENERIC EXTERNAL MEDICATION
Status: DC
Start: ? — End: 2018-08-21

## 2018-08-21 MED ORDER — FAMOTIDINE 20 MG PO TABS
20.00 | ORAL_TABLET | ORAL | Status: DC
Start: ? — End: 2018-08-21

## 2018-08-21 MED ORDER — CLONAZEPAM 0.5 MG PO TABS
0.50 | ORAL_TABLET | ORAL | Status: DC
Start: 2018-08-19 — End: 2018-08-21

## 2018-08-21 MED ORDER — SIMETHICONE 80 MG PO CHEW
80.00 | CHEWABLE_TABLET | ORAL | Status: DC
Start: ? — End: 2018-08-21

## 2018-08-21 MED ORDER — MELATONIN 3 MG PO TABS
6.00 | ORAL_TABLET | ORAL | Status: DC
Start: 2018-08-19 — End: 2018-08-21

## 2018-08-21 MED ORDER — CIPROFLOXACIN HCL 500 MG PO TABS
500.00 | ORAL_TABLET | ORAL | Status: DC
Start: 2018-08-19 — End: 2018-08-21

## 2018-08-21 MED ORDER — TRAZODONE HCL 50 MG PO TABS
50.00 | ORAL_TABLET | ORAL | Status: DC
Start: ? — End: 2018-08-21

## 2018-08-21 MED ORDER — METRONIDAZOLE 500 MG PO TABS
500.00 | ORAL_TABLET | ORAL | Status: DC
Start: 2018-08-19 — End: 2018-08-21

## 2018-08-21 NOTE — Telephone Encounter (Signed)
Covid-19 screening questions   Do you now or have you had a fever in the last 14 days? NO  Do you have any respiratory symptoms of shortness of breath or cough now or in the last 14 days? NO  Do you have any family members or close contacts with diagnosed or suspected Covid-19 in the past 14 days? NO  Have you been tested for Covid-19 and found to be positive? NO   ASKED HIM TO COME TOMORROW AT ABOUT 1250 FOR 1 PM APPOINTMENT   HE IS TO BRING ALL MEDS WITH HIM

## 2018-08-22 ENCOUNTER — Encounter: Payer: Self-pay | Admitting: Internal Medicine

## 2018-08-22 ENCOUNTER — Ambulatory Visit (INDEPENDENT_AMBULATORY_CARE_PROVIDER_SITE_OTHER): Payer: Medicare Other | Admitting: Internal Medicine

## 2018-08-22 DIAGNOSIS — K604 Rectal fistula, unspecified: Secondary | ICD-10-CM

## 2018-08-22 DIAGNOSIS — F332 Major depressive disorder, recurrent severe without psychotic features: Secondary | ICD-10-CM | POA: Diagnosis not present

## 2018-08-22 DIAGNOSIS — E43 Unspecified severe protein-calorie malnutrition: Secondary | ICD-10-CM | POA: Diagnosis not present

## 2018-08-22 DIAGNOSIS — K50819 Crohn's disease of both small and large intestine with unspecified complications: Secondary | ICD-10-CM | POA: Diagnosis not present

## 2018-08-22 DIAGNOSIS — Z09 Encounter for follow-up examination after completed treatment for conditions other than malignant neoplasm: Secondary | ICD-10-CM | POA: Diagnosis not present

## 2018-08-22 DIAGNOSIS — K754 Autoimmune hepatitis: Secondary | ICD-10-CM

## 2018-08-22 DIAGNOSIS — F410 Panic disorder [episodic paroxysmal anxiety] without agoraphobia: Secondary | ICD-10-CM | POA: Diagnosis not present

## 2018-08-22 DIAGNOSIS — K50818 Crohn's disease of both small and large intestine with other complication: Secondary | ICD-10-CM | POA: Diagnosis not present

## 2018-08-22 DIAGNOSIS — F5082 Avoidant/restrictive food intake disorder: Secondary | ICD-10-CM

## 2018-08-22 NOTE — Assessment & Plan Note (Signed)
Back to [facility]

## 2018-08-22 NOTE — Assessment & Plan Note (Signed)
?   This or related to depression or both

## 2018-08-22 NOTE — Assessment & Plan Note (Signed)
This is quiescent

## 2018-08-22 NOTE — Patient Instructions (Signed)
  We will be in touch about getting your [name] re-started at [facility].    I appreciate the opportunity to care for you. Silvano Rusk, MD, [facility]

## 2018-08-22 NOTE — Assessment & Plan Note (Addendum)
This remains a major problem.  He also has numerous vitamin deficiencies including a B6 and E.  I believe this is all mostly related to his depression and eating disturbance.

## 2018-08-22 NOTE — Assessment & Plan Note (Addendum)
This was drained and another seton was placed at [facility].  I do not think he really needs antibiotics at this point since he has had it drained and is improved.  He is intolerant or unwilling to take so many medications I think I will just hold off on that.

## 2018-08-22 NOTE — Assessment & Plan Note (Signed)
He is willing to restart [facility].  I think he needs to be reloaded.  Will contact [employer].  Continue prednisone for the time being.

## 2018-08-22 NOTE — Progress Notes (Signed)
[name] 53 y.o. 24-Dec-1965 [id number]  Assessment & Plan:  Anxiety and depression Main issue  CROHN'S DISEASE, LARGE AND SMALL INTESTINES Back to [facility]  Major depressive disorder, recurrent episode, severe ([place]) He seems better to me than he was but this is still his main issue and barrier to improvement.  I think is refusal to take medication and is eating problems are wrapped up in this.  He will not get better unless this improves.  I will see if I can get any psychiatric help for them.  It looks like Dr. [name] has reached out that [facility] and given him some names of some psychiatrists.  I do not know if he would merit inpatient treatment again, that would be best for him I suspect but he may not meet criteria for that.  Perirectal fistula and abscess This was drained and another seton was placed at [facility].  I do not think he really needs antibiotics at this point since he has had it drained and is improved.  He is intolerant or unwilling to take so many medications I think I will just hold off on that.  Severe protein-calorie malnutrition ([place]) This remains a major problem.  He also has numerous vitamin deficiencies including a B6 and E.  I believe this is all mostly related to his depression and eating disturbance.  Avoidant-restrictive food intake disorder (ARFID) ? ? This or related to depression or both  Crohn's disease with complication [facility]) He is willing to restart Entyvio.  I think he needs to be reloaded.  Will contact [employer].  Continue prednisone for the time being.  Autoimmune hepatitis-PSC overlap This is quiescent  His situation is quite complicated, he is not so acutely ill that he needs hospitalization though I do not think he is going to make it at home.  Some of his vitamin deficiencies could be responsible for some of his signs and symptoms as well.  I doubt he could tolerate nasoenteric nutritional supplements.  That is a  consideration.  Perhaps TPN.  However I still feel like if his depression would improve the rest of him would improve.  I have seen this before and him several years ago.  Similar situation.  CC: [name] Cruel, MD Dr. Kathlene November  Subjective:   Chief Complaint: Follow-up of Crohn's disease depression malnutrition  HPI  [name] returns for follow-up.  He was hospitalized a couple times in the last several months more recently in early June/late May when he drank bleach in a suicide attempt.  He tolerated that fairly well but he was placed in an inpatient behavioral health setting and went to [facility].  He did not last long in the psych facility due to his medical problems and went to [facility].  He was evaluated by GI and a surgeon there.  He had a 2 cm Henrene Pastor rectal abscess drained and a seton placed.  The surgeons there were considering a diverting ileostomy which I recommended against when they called me.  He improved to the point where they discharged him.  He remains depressed but not suicidal.  He continues to believe that most of his medications and eating makes him worse.  He is eating some but he feels like in the 2 days or so that he has been home is getting worse again.  He is only taking prednisone clonazepam and his PRN hydrocodone.  He was discharged on Augmentin but he felt like that was upsetting his  stomach so he stopped it.  No discharge from his anus.  Lots of borborygmi belching and diarrhea.  Says he is taking some type of multivitamin drink or supplement that he received at [facility].  Not sure what it is.  Weight down as listed below.  Living with his parents again.  Has a follow-up with Dr. [name] later today. Wt Readings from Last 3 Encounters:  08/22/18 140 lb (63.5 kg)  07/31/18 140 lb (63.5 kg)  07/19/18 154 lb 15.7 oz (70.3 kg)    Allergies  Allergen Reactions   Asacol [Mesalamine] Diarrhea    abd pain   Flagyl [Metronidazole] Diarrhea    abd pain   Amoxicillin  Other (See Comments)    Lots of gas   Azathioprine Diarrhea    abd pain   Gluten Meal Diarrhea   Mycophenolate Mofetil Diarrhea    abd pain   Other     NUTS; constipation   Wheat Bran Diarrhea    Constipation; flatulence; abd pain   Tape Itching and Rash    Please use "paper" tape   Current Meds  Medication Sig   clonazePAM (KLONOPIN) 1 MG tablet Take 1 mg by mouth 2 (two) times daily.   HYDROcodone-acetaminophen (NORCO/VICODIN) 5-325 MG tablet Take 1 tablet by mouth every 6 (six) hours as needed for moderate pain or severe pain.   predniSONE (DELTASONE) 20 MG tablet Take 20 mg by mouth daily with breakfast.   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 06/15/2018   Cancer of skin of neck    Cataract    Chronic mesenteric ischemia ([name])    Chronic pain syndrome 07/09/2016   Crohn's disease of small and large intestines ([place])    followed by dr Glendell Docker [place]   Dairy product intolerance    Diarrhea, functional    Family history of adverse reaction to anesthesia    Grover's disease    transient acantholytic dermatosis   History of alcohol abuse    History of basal cell carcinoma excision    2013 left leg   History of Clostridium difficile    10/ 2014   History of multiple concussions    x6   last one Jan 2017 per pt--  no residual   History of substance abuse ([place])    quit 1997 per pt   History of suicide attempt    05-18-2012  overdose /  failure to thrive   Hypercholesterolemia    Iron deficiency anemia due to chronic blood loss    Major depression, recurrent, chronic ([place])    Osteopenia    Personal history of adenomatous colonic polyps 12/2010, 03/2012   12/2010 - 8 mm serrated adenoma of rectum   Portal vein thrombosis 03/21/2015   right   Primary sclerosing  cholangitis    ? hepatitis overlap - liver bx x 2 and MRCP   Seasonal allergies    Substance abuse ([place]) 1997   Alcohol   Vitamin A deficiency 05/23/2018   Vitamin B6 deficiency 07/27/2018   Vitamin C deficiency 05/23/2018   Past Surgical History:  Procedure Laterality Date   ABDOMINAL AORTAGRAM N/A 03/29/2012   Procedure: ABDOMINAL Maxcine Ham;  Surgeon: Serafina Mitchell, MD;  Location: [facility] CATH LAB;  Service: Cardiovascular;  Laterality: N/A;   ADENOIDECTOMY  age 51   BIOPSY  05/31/2018   Procedure: BIOPSY;  Surgeon: Milus Banister, MD;  Location: [facility];  Service: Endoscopy;;   CATARACT EXTRACTION W/ INTRAOCULAR LENS  IMPLANT, BILATERAL  2009   COLONOSCOPY  2001, 05/02/2003, 01/28/11   2012: Right colon Crohn's, rectal polyp   COLONOSCOPY  03/31/2012   Procedure: COLONOSCOPY;  Surgeon: Jerene Bears, MD;  Location: [facility];  Service: Gastroenterology;  Laterality: N/A;   COLONOSCOPY WITH PROPOFOL N/A 05/31/2018   Procedure: COLONOSCOPY WITH PROPOFOL;  Surgeon: Milus Banister, MD;  Location: [facility] END[facility]COPY;  Service: Endoscopy;  Laterality: N/A;   ESOPHAGOGASTRODUODENOSCOPY  01/28/11   Normal   FOOT SURGERY Right age 79   MOHS SURGERY Left 11/2013   left ankle parakerotosis    PERCUTANEOUS LIVER BIOPSY  2007 and 2008   PILONIDAL CYST EXCISION  age 11   [place] N/A 11/06/2015   Procedure: PLACEMENT OF SETON;  Surgeon: [name], MD;  Location: [facility];  Service: General;  Laterality: N/A;   UPPER GASTROINTESTINAL ENDOSCOPY     Social History   Social History Narrative   Single, history of substance abuse in recovery   Previously occupied on medical disability   1 child    Elderly parents involved and he helps them   1 brother in [place]   family history includes Allergies in his father; Breast cancer in his mother; Clotting disorder in his father; Heart disease in his father; Stomach cancer in his mother; Thyroid  cancer in his father.   Review of Systems  See HPI Objective:   Physical Exam BP 105/64    Pulse 85    Temp 97.9 F (36.6 C)    Ht 6' 2"  (1.88 m)    Wt 140 lb (63.5 kg)    BMI 17.97 kg/m  Thin chronically ill-appearing. Eyes anicteric. Lungs clear Heart sounds are normal The abdomen is soft and nontender without organomegaly or mass.  Bowel sounds are quite active.  No bruits. Extremities show trace edema and some scaly erythema. Affect is flat but not as flat as it has been recently. Alert and oriented x3.

## 2018-08-22 NOTE — Assessment & Plan Note (Signed)
Main issue

## 2018-08-22 NOTE — Assessment & Plan Note (Addendum)
He seems better to me than he was but this is still his main issue and barrier to improvement.  I think is refusal to take medication and is eating problems are wrapped up in this.  He will not get better unless this improves.  I will see if I can get any psychiatric help for them.  It looks like Dr. [name] has reached out that [facility] and given him some names of some psychiatrists.  I do not know if he would merit inpatient treatment again, that would be best for him I suspect but he may not meet criteria for that.

## 2018-08-23 ENCOUNTER — Encounter: Payer: Self-pay | Admitting: *Deleted

## 2018-08-23 ENCOUNTER — Telehealth (HOSPITAL_COMMUNITY): Payer: Self-pay | Admitting: Professional

## 2018-08-23 ENCOUNTER — Other Ambulatory Visit: Payer: Self-pay | Admitting: *Deleted

## 2018-08-23 NOTE — Patient Outreach (Signed)
[facility]) Care Management  08/23/2018  GYAN CAMBRE 1965-05-01 [id number]   CSW was able to make initial contact with patient today to perform phone assessment, as well as assess and assist with social work needs and services.  CSW introduced self, explained role and types of services provided through [facility] ([facility]).  CSW further explained to patient that CSW works with patient's Telephonic RNCM, also with [facility], Dionne Leath. CSW then explained the reason for the call, indicating that Mrs. Dia Sitter thought that patient would benefit from social work services and resources to assist with symptoms of Anxiety, Depression and Panic Attacks.  CSW obtained two HIPAA compliant identifiers from patient, which included patient's name and date of birth.  CSW explained to patient that CSW was able to speak with [name] this morning, patient's Gastroenterologist with [facility], to discuss patient's case and current plan of care.  [name] informed CSW that patient was discharged from [facility] in [place], [place] on Saturday, August 19, 2018.  [name] is requesting that CSW assist patient with obtaining a community psychiatrist and therapist for medication management and psychotherapy services.  Patient admits to having a long history of Anxiety, Depression and Panic Attacks and would very much like to get established with a psychiatrist and therapist.  Patient indicated that his first choice for receiving services would be through [facility]).  CSW made an attempt to try and contact Santiago Glad with scheduling at [facility] today to get patient enrolled in services; however, Santiago Glad was unavailable at the time of CSW's call.  A HIPAA compliant message was left for Santiago Glad on voicemail and CSW is currently awaiting a return call.  Patient indicated that his second choice  would be to receive services through [facility].  CSW noted where patient received correspondence, via MyChart, from [name] today, in which [name] encouraged patient to contact Velva Harman at [facility] to see about enrolling patient in the partial hospitalization program at [facility].  CSW agreed to follow-up with patient again on Friday, August 25, 2018, around 9:00AM to ensure that patient made the call and report findings of return call from [name] with [facility].  Patient has Severe Protein-Calorie Malnutrition, in addition to numerous vitamin deficiencies, which [name] believes is mostly related to patient's Depression and eating disturbance.  With that being said, patient has also been diagnosed with Avoidant-Restrictive Food Intake Disorder, also related to patient's Depression and complications with Crohn's Disease.  Patient admitted to [place] that he is trying to increase his food intake, but has a significant decrease in appetite, also fearing that he will have a "flare up" with his Crohn's Disease.  Patient indicated that he takes his medications exactly as prescribed, especially the Klonopin 1 MG PO Twice Daily.  Patient is willing to entertain the thought of adding an antidepressant medication to his regimen, which CSW will discuss with patient's Primary Care Physician, Dr. Lona Kettle.  CSW offered to provide counseling and supportive services to patient telephonically, at least until CSW is able to get patient established with a psychiatrist and therapist in the community.  Patient voiced understanding, agreeing to "give it some consideration".  Patient denies feeling homicidal or suicidal at present, nor is patient experiencing auditory or visual hallucinations and/or delusions.  Patient is agreeable to having CSW mail him EMMI information, pertaining specifically to  Depression and Anxiety.  The following EMMI  information has been prescribed, printed and mailed to patient's parents' home, where patient currently resides:    [place]:  SIGNS OF DEPRESSION  DEPRESSION  DEPRESSION:  MEDICATION  DEPRESSION:  OTHER THINGS YOU CAN DO  [place], ADULT  Nat Christen, [name], [name], [place]  Licensed Clinical Social Worker  [place]  Mailing [place] N. [address], [place], [place] [address] Physical Address-[address], [place], [place] [address] Toll Free Main # [phone] Fax # [phone] Cell # [phone]  Office # [phone] [name]

## 2018-08-24 ENCOUNTER — Telehealth (HOSPITAL_COMMUNITY): Payer: Self-pay | Admitting: Professional

## 2018-08-25 ENCOUNTER — Encounter (HOSPITAL_COMMUNITY): Payer: Self-pay | Admitting: Emergency Medicine

## 2018-08-25 ENCOUNTER — Other Ambulatory Visit: Payer: Self-pay | Admitting: *Deleted

## 2018-08-25 ENCOUNTER — Ambulatory Visit: Payer: Self-pay | Admitting: *Deleted

## 2018-08-25 ENCOUNTER — Emergency Department (HOSPITAL_COMMUNITY)
Admission: EM | Admit: 2018-08-25 | Discharge: 2018-08-25 | Disposition: A | Discharge status: Home / Self Care | Payer: Medicare Other | Attending: Emergency Medicine | Admitting: Emergency Medicine

## 2018-08-25 ENCOUNTER — Other Ambulatory Visit: Payer: Self-pay

## 2018-08-25 DIAGNOSIS — I10 Essential (primary) hypertension: Secondary | ICD-10-CM | POA: Diagnosis not present

## 2018-08-25 DIAGNOSIS — Z87891 Personal history of nicotine dependence: Secondary | ICD-10-CM | POA: Diagnosis not present

## 2018-08-25 DIAGNOSIS — F332 Major depressive disorder, recurrent severe without psychotic features: Secondary | ICD-10-CM | POA: Insufficient documentation

## 2018-08-25 DIAGNOSIS — Z79899 Other long term (current) drug therapy: Secondary | ICD-10-CM | POA: Insufficient documentation

## 2018-08-25 DIAGNOSIS — F329 Major depressive disorder, single episode, unspecified: Secondary | ICD-10-CM | POA: Diagnosis present

## 2018-08-25 DIAGNOSIS — F331 Major depressive disorder, recurrent, moderate: Secondary | ICD-10-CM | POA: Diagnosis not present

## 2018-08-25 DIAGNOSIS — R45851 Suicidal ideations: Secondary | ICD-10-CM

## 2018-08-25 LAB — CBC WITH DIFFERENTIAL/PLATELET
Abs Immature Granulocytes: 0.08 10*3/uL — ABNORMAL HIGH (ref 0.00–0.07)
Basophils Absolute: 0 10*3/uL (ref 0.0–0.1)
Basophils Relative: 0 %
Eosinophils Absolute: 0 10*3/uL (ref 0.0–0.5)
Eosinophils Relative: 0 %
HCT: 42.9 % (ref 39.0–52.0)
Hemoglobin: 13.7 g/dL (ref 13.0–17.0)
Immature Granulocytes: 1 %
Lymphocytes Relative: 5 %
Lymphs Abs: 0.7 10*3/uL (ref 0.7–4.0)
MCH: 28.1 pg (ref 26.0–34.0)
MCHC: 31.9 g/dL (ref 30.0–36.0)
MCV: 88.1 fL (ref 80.0–100.0)
Monocytes Absolute: 0.7 10*3/uL (ref 0.1–1.0)
Monocytes Relative: 5 %
Neutro Abs: 11.7 10*3/uL — ABNORMAL HIGH (ref 1.7–7.7)
Neutrophils Relative %: 89 %
Platelets: 213 10*3/uL (ref 150–400)
RBC: 4.87 MIL/uL (ref 4.22–5.81)
RDW: 18.7 % — ABNORMAL HIGH (ref 11.5–15.5)
WBC: 13.2 10*3/uL — ABNORMAL HIGH (ref 4.0–10.5)
nRBC: 0 % (ref 0.0–0.2)

## 2018-08-25 LAB — COMPREHENSIVE METABOLIC PANEL
ALT: 37 U/L (ref 0–44)
AST: 26 U/L (ref 15–41)
Albumin: 3.1 g/dL — ABNORMAL LOW (ref 3.5–5.0)
Alkaline Phosphatase: 90 U/L (ref 38–126)
Anion gap: 5 (ref 5–15)
BUN: 14 mg/dL (ref 6–20)
CO2: 28 mmol/L (ref 22–32)
Calcium: 8.2 mg/dL — ABNORMAL LOW (ref 8.9–10.3)
Chloride: 103 mmol/L (ref 98–111)
Creatinine, Ser: 0.88 mg/dL (ref 0.61–1.24)
GFR calc Af Amer: >60 mL/min (ref >60)
GFR calc non Af Amer: >60 mL/min (ref >60)
Glucose, Bld: 85 mg/dL (ref 70–99)
Potassium: 3.7 mmol/L (ref 3.5–5.1)
Sodium: 136 mmol/L (ref 135–145)
Total Bilirubin: 0.6 mg/dL (ref 0.3–1.2)
Total Protein: 6.1 g/dL — ABNORMAL LOW (ref 6.5–8.1)

## 2018-08-25 LAB — RAPID URINE DRUG SCREEN, HOSP PERFORMED
Amphetamines: NOT DETECTED
Barbiturates: NOT DETECTED
Benzodiazepines: POSITIVE — AB
Cocaine: NOT DETECTED
Opiates: POSITIVE — AB
Tetrahydrocannabinol: NOT DETECTED

## 2018-08-25 LAB — ETHANOL: Alcohol, Ethyl (B): <10 mg/dL (ref <10)

## 2018-08-25 MED ORDER — ALUM & MAG HYDROXIDE-SIMETH 200-200-20 MG/5ML PO SUSP: 30.0000 mL | Freq: Four times a day (QID) | ORAL | Status: DC | PRN

## 2018-08-25 MED ORDER — ACETAMINOPHEN 325 MG PO TABS: 650.0000 mg | ORAL_TABLET | ORAL | Status: DC | PRN

## 2018-08-25 MED ORDER — ONDANSETRON HCL 4 MG PO TABS: 4.0000 mg | ORAL_TABLET | Freq: Three times a day (TID) | ORAL | Status: DC | PRN

## 2018-08-25 NOTE — ED Notes (Signed)
Bed: [facility]PT4 Expected date: 08/25/18 Expected time: 12:41 AM Means of arrival:  Comments: [place] @ 0200

## 2018-08-25 NOTE — ED Notes (Signed)
ONE BLUE ONE GOLD ONE RED SAVE TUBE IN MAIN LAB

## 2018-08-25 NOTE — BH Assessment (Signed)
Assessment Note  [name] is an 53 y.o. male that presents this date with ongoing depression requesting assistance with medication management. Patient has a history of Crohn's disease and anxiety. Patient denies any S/I, H/I or AVH this date. Patient reports two prior attempts at self harm and was last seen on 07/31/18 when he ingested 8 oz of bleach on that date. Patient has multiple health issues to include hepatitis, Grovers disease and distant alcohol abuse. Patient reports he also overdosed in 2015 although denies he received any medical care for that event and states "He just woke up." Patient denies any current SA issues although reports a history of alcohol use 20 years. Denies drug use. He is currently being prescribed clonopin for anxiety by his PCP and was taking Remeron for depression but discontinued it because he also thought it was causing worsening abdominal pain. Treatment has been complicated by continued severe depression and anxiety. He has a history of hospice care several years ago but he improved and no longer required that service.On interview, patient reports that his depression is circumstantial due toworsening symptoms of his medical conditionand presents this date to be evlauted for possible medication interventions. He was placed on disability in 2011.Patient declined any medical management for depression at that time and was cleared by psychiatry. Patient denies having a current psychiatric OP provider. Patient is oriented x 4 and speaks with normal tone and volume. This Probation officer discussed treatment options with patient which included overnight observation if needed to evaluate for possible medication interventions which patient declined. Patient states he will contact his OP provider to assist with medication management. Patient is requesting to be discharged. Case was staffed with Romilda Garret FNP who noted patient was seen and chart reviewed with treatment team. Patient has  multiple medical co-morbidities that are contributing to his depression and anxiety. He lives with his elderly parents. His PCP is prescribing him Clonazepam 0.5 mg BID for anxiety. He is willing to follow up outpatient for his depression. Patient is psychiatrically clear.   Diagnosis: F33.2 MDD recurrent without psychotic symptoms, severe  Past Medical History:  Past Medical History:  Diagnosis Date  . Allergy   . Anal fistula   . Anxiety   . 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 Autoimmune hepatitis ([place]) 01/19/2013  . Avoidant-restrictive food intake disorder (ARFID) ? 06/15/2018  . Cancer of skin of neck   . Cataract   . Chronic mesenteric ischemia ([place])   . Chronic pain syndrome 07/09/2016  . Crohn's disease of small and large intestines ([place])    followed by dr Glendell Docker gessner  . Dairy product intolerance   . Diarrhea, functional   . Family history of adverse reaction to anesthesia   . Grover's disease    transient acantholytic dermatosis  . History of alcohol abuse   . History of basal cell carcinoma excision    2013 left leg  . History of Clostridium difficile    10/ 2014  . History of multiple concussions    x6   last one Jan 2017 per pt--  no residual  . History of substance abuse ([place])    quit 1997 per pt  . History of suicide attempt    05-18-2012  overdose /  failure to thrive  . Hypercholesterolemia   . Iron deficiency anemia due to chronic blood loss   . Major depression, recurrent, chronic ([place])   . Osteopenia   . Personal history of adenomatous colonic polyps  12/2010, 03/2012   12/2010 - 8 mm serrated adenoma of rectum  . Portal vein thrombosis 03/21/2015   right  . Primary sclerosing cholangitis    ? hepatitis overlap - liver bx x 2 and MRCP  . Seasonal allergies   . Substance abuse ([place]) 1997   Alcohol  . Vitamin A deficiency 05/23/2018  . Vitamin B6 deficiency 07/27/2018  . Vitamin C deficiency 05/23/2018    Past Surgical History:  Procedure  Laterality Date  . ABDOMINAL AORTAGRAM N/A 03/29/2012   Procedure: ABDOMINAL Maxcine Ham;  Surgeon: Serafina Mitchell, MD;  Location: [facility] CATH LAB;  Service: Cardiovascular;  Laterality: N/A;  . ADENOIDECTOMY  age 84  . BIOPSY  05/31/2018   Procedure: BIOPSY;  Surgeon: Milus Banister, MD;  Location: [facility];  Service: Endoscopy;;  . CATARACT EXTRACTION W/ INTRAOCULAR LENS  IMPLANT, BILATERAL  2009  . COLONOSCOPY  2001, 05/02/2003, 01/28/11   2012: Right colon Crohn's, rectal polyp  . COLONOSCOPY  03/31/2012   Procedure: COLONOSCOPY;  Surgeon: Jerene Bears, MD;  Location: [place];  Service: Gastroenterology;  Laterality: N/A;  . COLONOSCOPY WITH PROPOFOL N/A 05/31/2018   Procedure: COLONOSCOPY WITH PROPOFOL;  Surgeon: Milus Banister, MD;  Location: [facility] ENDOSCOPY;  Service: Endoscopy;  Laterality: N/A;  . ESOPHAGOGASTRODUODENOSCOPY  01/28/11   Normal  . FOOT SURGERY Right age 52  . MOHS SURGERY Left 11/2013   left ankle parakerotosis   . PERCUTANEOUS LIVER BIOPSY  2007 and 2008  . PILONIDAL CYST EXCISION  age 15  . PLACEMENT OF SETON N/A 11/06/2015   Procedure: PLACEMENT OF SETON;  Surgeon: [name], MD;  Location: [facility];  Service: General;  Laterality: N/A;  . UPPER GASTROINTESTINAL ENDOSCOPY      Family History:  Family History  Problem Relation Age of Onset  . Heart disease Father   . Thyroid cancer Father   . Allergies Father   . Clotting disorder Father   . Breast cancer Mother   . Stomach cancer Mother   . Colon cancer Neg Hx   . Esophageal cancer Neg Hx   . Rectal cancer Neg Hx     Social History:  reports that he quit smoking about 21 years ago. His smoking use included cigarettes. He has a 15.00 pack-year smoking history. He has never used smokeless tobacco. He reports that he does not drink alcohol or use drugs.  Additional Social History:  Alcohol / Drug Use Pain Medications: See MAR Prescriptions: See MAR Over the Counter: See MAR History  of alcohol / drug use?: No history of alcohol / drug abuse Longest period of sobriety (when/how long): NA  CIWA: CIWA-Ar BP: (!) 141/86 Pulse Rate: 87 COWS:    Allergies:  Allergies  Allergen Reactions  . Asacol [Mesalamine] Diarrhea    abd pain  . Flagyl [Metronidazole] Diarrhea    abd pain  . Amoxicillin Other (See Comments)    Lots of gas  . Azathioprine Diarrhea    abd pain  . Gluten Meal Diarrhea  . Mycophenolate Mofetil Diarrhea    abd pain  . Other     NUTS; constipation  . Wheat Bran Diarrhea    Constipation; flatulence; abd pain  . Tape Itching and Rash    Please use "paper" tape    Home Medications: (Not in a hospital admission)   OB/GYN Status:  No LMP for male patient.  General Assessment Data [facility] of Assessment: [facility] TTS Assessment: In system Is this  a Tele or Face-to-Face Assessment?: Face-to-Face Is this an Initial Assessment or a Re-assessment for this encounter?: Initial Assessment Patient Accompanied by:: N/A Language Other than English: No Living Arrangements: Other (Comment)(Parents) What gender do you identify as?: Male Marital status: Single Living Arrangements: Parent Can pt return to current living arrangement?: Yes Admission Status: Voluntary Is patient capable of signing voluntary admission?: Yes Referral Source: Self/Family/Friend Insurance type: Medicaid  Medical Screening Exam ([place]) Medical Exam completed: Yes  Crisis Care Plan Living Arrangements: Parent Legal Guardian: (NA) Name of Psychiatrist: None Name of Therapist: None  Education Status Is patient currently in school?: No Is the patient employed, unemployed or receiving disability?: Unemployed  Risk to self with the past 6 months Suicidal Ideation: No Has patient been a risk to self within the past 6 months prior to admission? : Yes Suicidal Intent: No Has patient had any suicidal intent within the past 6 months prior to admission? : Yes Is  patient at risk for suicide?: Yes Suicidal Plan?: No Has patient had any suicidal plan within the past 6 months prior to admission? : Yes Specify Current Suicidal Plan: Drank bleach  Access to Means: No Specify Access to Suicidal Means: NA What has been your use of drugs/alcohol within the last 12 months?: Denies Previous Attempts/Gestures: Yes How many times?: 2 Other Self Harm Risks: (Health issues) Triggers for Past Attempts: (Health issues) Intentional Self Injurious Behavior: None Family Suicide History: No Recent stressful life event(s): Other (Comment)(Health issues) Persecutory voices/beliefs?: No Depression: Yes Depression Symptoms: Isolating, Fatigue Substance abuse history and/or treatment for substance abuse?: No Suicide prevention information given to non-admitted patients: Not applicable  Risk to Others within the past 6 months Homicidal Ideation: No Does patient have any lifetime risk of violence toward others beyond the six months prior to admission? : No Thoughts of Harm to Others: No Current Homicidal Intent: No Current Homicidal Plan: No Access to Homicidal Means: No Identified Victim: NA History of harm to others?: No Assessment of Violence: None Noted Violent Behavior Description: NA Does patient have access to weapons?: No Criminal Charges Pending?: No Does patient have a court date: No Is patient on probation?: No  Psychosis Hallucinations: None noted Delusions: None noted  Mental Status Report Appearance/Hygiene: Unremarkable Eye Contact: Fair Motor Activity: Freedom of movement Speech: Logical/coherent Level of Consciousness: Quiet/awake Mood: Pleasant Affect: Appropriate to circumstance Anxiety Level: Minimal Thought Processes: Coherent, Relevant Judgement: Partial Orientation: Person, Place, Time Obsessive Compulsive Thoughts/Behaviors: None  Cognitive Functioning Concentration: Normal Memory: Recent Intact, Remote Intact Is patient  IDD: No Insight: Fair Impulse Control: Fair Appetite: Fair Have you had any weight changes? : No Change Sleep: No Change Total Hours of Sleep: 7 Vegetative Symptoms: None  ADLScreening [facility] Assessment Services) Patient's cognitive ability adequate to safely complete daily activities?: Yes Patient able to express need for assistance with ADLs?: Yes Independently performs ADLs?: Yes (appropriate for developmental age)  Prior Inpatient Therapy Prior Inpatient Therapy: Yes Prior Therapy Dates: 2020 Prior Therapy Facilty/Provider(s): [facility] Reason for Treatment: MH issues  Prior Outpatient Therapy Prior Outpatient Therapy: No Does patient have an ACCT team?: No Does patient have Intensive In-House Services?  : No Does patient have [facility] services? : No Does patient have P4CC services?: No  ADL Screening (condition at time of admission) Patient's cognitive ability adequate to safely complete daily activities?: Yes Is the patient deaf or have difficulty hearing?: No Does the patient have difficulty seeing, even when wearing glasses/contacts?: No Does the patient  have difficulty concentrating, remembering, or making decisions?: No Patient able to express need for assistance with ADLs?: Yes Does the patient have difficulty dressing or bathing?: No Independently performs ADLs?: Yes (appropriate for developmental age) Does the patient have difficulty walking or climbing stairs?: No Weakness of Legs: None Weakness of Arms/Hands: None  Home Assistive Devices/Equipment Home Assistive Devices/Equipment: None  Therapy Consults (therapy consults require a physician order) PT Evaluation Needed: No OT Evalulation Needed: No SLP Evaluation Needed: No Abuse/Neglect Assessment (Assessment to be complete while patient is alone) Physical Abuse: Denies Verbal Abuse: Denies Sexual Abuse: Denies Exploitation of patient/patient's resources: Denies Self-Neglect: Denies Values / Beliefs Cultural  Requests During Hospitalization: None Spiritual Requests During Hospitalization: None Consults Spiritual Care Consult Needed: No Social Work Consult Needed: No Regulatory affairs officer (For Healthcare) Does Patient Have a Medical Advance Directive?: No Would patient like information on creating a medical advance directive?: No - Patient declined          Disposition: Case was staffed with Romilda Garret FNP who noted patient was seen and chart reviewed with treatment team. Patient has multiple medical co-morbidities that are contributing to his depression and anxiety. He lives with his elderly parents. His PCP is prescribing him Clonazepam 0.5 mg BID for anxiety. He is willing to follow up outpatient for his depression. Patient is psychiatrically clear.   Disposition Initial Assessment Completed for this Encounter: Yes Disposition of Patient: Discharge Patient refused recommended treatment: No Mode of transportation if patient is discharged/movement?: (Unk)  On Site Evaluation by:   Reviewed with Physician:    Mamie Nick 08/25/2018 4:30 PM

## 2018-08-25 NOTE — ED Provider Notes (Signed)
[place] DEPT Provider Note   CSN: [id number] Arrival date & time: 08/25/18  1010     History   Chief Complaint Chief Complaint  Patient presents with  . Depression    HPI [name] is a 53 y.o. male.     53 year old male with extensive past medical history including Crohn's disease, chronic mesenteric ischemia, autoimmune hepatitis, anxiety/depression who presents with depression and SI.  Patient reports that 3 weeks ago he had a suicide attempt by drinking bleach.  He was evaluated at an outside facility.  He was then hospitalized for Crohn's disease flare.  He states that since discharge home, he has not been doing well.  He has had significant problems with depression and anxiety.  He was having thoughts of self-harm last night.  He considered overdosing but states that he did not have any pills to overdose on.  He denies any recent attempt.  No hallucinations or HI.  He denies any current substance abuse.  The history is provided by the patient.  Depression    Past Medical History:  Diagnosis Date  . Allergy   . Anal fistula   . Anxiety   . 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 Autoimmune hepatitis ([place]) 01/19/2013  . Avoidant-restrictive food intake disorder (ARFID) ? 06/15/2018  . Cancer of skin of neck   . Cataract   . Chronic mesenteric ischemia ([place])   . Chronic pain syndrome 07/09/2016  . Crohn's disease of small and large intestines ([name])    followed by dr Glendell Docker gessner  . Dairy product intolerance   . Diarrhea, functional   . Family history of adverse reaction to anesthesia   . Grover's disease    transient acantholytic dermatosis  . History of alcohol abuse   . History of basal cell carcinoma excision    2013 left leg  . History of Clostridium difficile    10/ 2014  . History of multiple concussions    x6   last one Jan 2017 per pt--  no residual  . History of substance abuse ([place])    quit 1997 per pt  .  History of suicide attempt    05-18-2012  overdose /  failure to thrive  . Hypercholesterolemia   . Iron deficiency anemia due to chronic blood loss   . Major depression, recurrent, chronic ([place])   . Osteopenia   . Personal history of adenomatous colonic polyps 12/2010, 03/2012   12/2010 - 8 mm serrated adenoma of rectum  . Portal vein thrombosis 03/21/2015   right  . Primary sclerosing cholangitis    ? hepatitis overlap - liver bx x 2 and MRCP  . Seasonal allergies   . Substance abuse ([place]) 1997   Alcohol  . Vitamin A deficiency 05/23/2018  . Vitamin B6 deficiency 07/27/2018  . Vitamin C deficiency 05/23/2018    Patient Active Problem List   Diagnosis Date Noted  . Suicide attempt ([place])   . Crohn's disease with complication ([place])   . Vitamin B6 deficiency 07/27/2018  . Vitamin E deficiency 07/27/2018  . Abdominal pain 07/19/2018  . Avoidant-restrictive food intake disorder (ARFID) ? 06/15/2018  . Dilated intrahepatic bile ducts   . Vitamin C deficiency 05/23/2018  . Vitamin A deficiency 05/23/2018  . Loss of weight 04/07/2018  . Grover's disease 11/04/2017  . Chronic pain syndrome 07/09/2016  . Perirectal fistula and abscess 05/18/2016  . Portal vein thrombosis 03/28/2015  . Insomnia 11/29/2014  .  Long-term use of immunosuppressant medication - Entyvio 11/28/2014  . Posterior Anal fissure 10/03/2014  . Anemia due to chronic blood loss 02/11/2014  . 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 Rash on trunk 12/31/2012  . Arthralgia 08/28/2012  . Severe protein-calorie malnutrition ([place]) 04/12/2012  . Major depressive disorder, recurrent episode, severe ([place]) 04/12/2012  . Personal history of adenomatous colonic polyps 01/18/2012  . Osteopenia 02/17/2011  . Long term current use of systemic steroids 08/11/2010  . Essential hypertension 08/09/2008  . VITAMIN D DEFICIENCY 09/26/2007  . HYPERLIPIDEMIA, SEVERE 09/26/2007  . CROHN'S DISEASE, LARGE AND SMALL INTESTINES  03/28/2007    Past Surgical History:  Procedure Laterality Date  . ABDOMINAL AORTAGRAM N/A 03/29/2012   Procedure: ABDOMINAL Maxcine Ham;  Surgeon: Serafina Mitchell, MD;  Location[facility] CATH LAB;  Service: Cardiovascular;  Laterality: N/A;  . ADENOIDECTOMY  age 23  . BIOPSY  05/31/2018   Procedure: BIOPSY;  Surgeon: Milus Banister, MD;  Location: [facility];  Service: Endoscopy;;  . CATARACT EXTRACTION W/ INTRAOCULAR LENS  IMPLANT, BILATERAL  2009  . COLONOSCOPY  2001, 05/02/2003, 01/28/11   2012: Right colon Crohn's, rectal polyp  . COLONOSCOPY  03/31/2012   Procedure: COLONOSCOPY;  Surgeon: Jerene Bears, MD;  Location: [place];  Service: Gastroenterology;  Laterality: N/A;  . COLONOSCOPY WITH PROPOFOL N/A 05/31/2018   Procedure: COLONOSCOPY WITH PROPOFOL;  Surgeon: Milus Banister, MD;  Location: [facility] ENDOSCOPY;  Service: Endoscopy;  Laterality: N/A;  . ESOPHAGOGASTRODUODENOSCOPY  01/28/11   Normal  . FOOT SURGERY Right age 40  . MOHS SURGERY Left 11/2013   left ankle parakerotosis   . PERCUTANEOUS LIVER BIOPSY  2007 and 2008  . PILONIDAL CYST EXCISION  age 48  . PLACEMENT OF SETON N/A 11/06/2015   Procedure: PLACEMENT OF SETON;  Surgeon: [name], MD;  Location: [facility];  Service: General;  Laterality: N/A;  . UPPER GASTROINTESTINAL ENDOSCOPY          Home Medications    Prior to Admission medications   Medication Sig Start Date End Date Taking? Authorizing Provider  clonazePAM (KLONOPIN) 1 MG tablet Take 1 mg by mouth 2 (two) times daily.    [provider]  HYDROcodone-acetaminophen (NORCO/VICODIN) 5-325 MG tablet Take 1 tablet by mouth every 6 (six) hours as needed for moderate pain or severe pain. 07/25/18   [name], MD  predniSONE (DELTASONE) 20 MG tablet Take 20 mg by mouth daily with breakfast.    [provider]    Family History Family History  Problem Relation Age of Onset  . Heart disease Father   . Thyroid cancer  Father   . Allergies Father   . Clotting disorder Father   . Breast cancer Mother   . Stomach cancer Mother   . Colon cancer Neg Hx   . Esophageal cancer Neg Hx   . Rectal cancer Neg Hx     Social History Social History   Tobacco Use  . Smoking status: Former Smoker    Packs/day: 1.00    Years: 15.00    Pack years: 15.00    Types: Cigarettes    Quit date: 03/28/1997    Years since quitting: 21.4  . Smokeless tobacco: Never Used  Substance Use Topics  . Alcohol use: No    Alcohol/week: 0.0 standard drinks  . Drug use: No    Comment: QUIT USING DRUGS IN 1997     Allergies   Asacol [mesalamine], Flagyl [metronidazole], Amoxicillin,  Azathioprine, Gluten meal, Mycophenolate mofetil, Other, Wheat bran, and Tape   Review of Systems Review of Systems  Psychiatric/Behavioral: Positive for depression.   All other systems reviewed and are negative except that which was mentioned in HPI   Physical Exam Updated Vital Signs BP (!) 141/86   Pulse 87   Temp 97.9 F (36.6 C) (Oral)   Resp 13   SpO2 100%   Physical Exam Vitals signs and nursing note reviewed.  Constitutional:      General: He is not in acute distress.    Appearance: He is well-developed.     Comments: Thin, chronically ill-appearing  HENT:     Head: Normocephalic and atraumatic.  Eyes:     Conjunctiva/sclera: Conjunctivae normal.  Neck:     Musculoskeletal: Neck supple.  Skin:    General: Skin is warm and dry.  Neurological:     Mental Status: He is alert and oriented to person, place, and time.  Psychiatric:     Comments: Depressed mood, flat affect      ED Treatments / Results  Labs (all labs ordered are listed, but only abnormal results are displayed) Labs Reviewed  COMPREHENSIVE METABOLIC PANEL - Abnormal; Notable for the following components:      Result Value   Calcium 8.2 (*)    Total Protein 6.1 (*)    Albumin 3.1 (*)    All other components within normal limits  RAPID URINE DRUG  SCREEN, HOSP PERFORMED - Abnormal; Notable for the following components:   Opiates POSITIVE (*)    Benzodiazepines POSITIVE (*)    All other components within normal limits  CBC WITH DIFFERENTIAL/PLATELET - Abnormal; Notable for the following components:   WBC 13.2 (*)    RDW 18.7 (*)    Neutro Abs 11.7 (*)    Abs Immature Granulocytes 0.08 (*)    All other components within normal limits  ETHANOL    EKG    Radiology No results found.  Procedures Procedures (including critical care time)  Medications Ordered in ED Medications  acetaminophen (TYLENOL) tablet 650 mg (has no administration in time range)  ondansetron (ZOFRAN) tablet 4 mg (has no administration in time range)  alum & mag hydroxide-simeth (MAALOX/MYLANTA) 200-200-20 MG/5ML suspension 30 mL (has no administration in time range)     Initial Impression / Assessment and Plan / ED Course  I have reviewed the triage vital signs and the nursing notes.  Pertinent labs & imaging results that were available during my care of the patient were reviewed by me and considered in my medical decision making (see chart for details).        Screening labwork reassuring. UDS + benzos and opiates. Consulted TTS given recent life-threatening overdose. Dispo will be determined based on psychiatry team recommendations.  Final Clinical Impressions(s) / ED Diagnoses   Final diagnoses:  None    ED Discharge Orders    None       , Wenda Overland, MD 08/25/18 1319

## 2018-08-25 NOTE — Patient Outreach (Signed)
[facility]) Care Management  08/25/2018  [name] [birth year] [id number]   CSW noted that patient is in route to the Emergency Department at [facility]; therefore, CSW attempted to contact patient this morning to inquire; however, patient was unavailable at the time of CSW's call.  Patient's mother, [name] answered the phone, indicating that patient is feeling suicidal and felt the need to seek treatment.  CSW will continue to monitor patient's chart, follow patient while at [facility] and assist with social work needs and services, as requested.  Nat Christen, [name], [name], LCSW  Licensed Education officer, [facility]  Mailing [name] [place], [place] [address] Physical Address-[address], [place], [place] [address] Toll Free Main # [phone] Fax # [phone] Cell # [phone]  Office # [phone] [name]

## 2018-08-25 NOTE — ED Triage Notes (Addendum)
Per pt, states he started having suicidal thoughts around 0300-states he is hopeless and cant get his life back on track-states multiple health issues-states attempted a virtual therapeutic group but didn't think it helped-doe not have a psychiatrist/therapist-take clonopin for anxiety-denies SI at this time

## 2018-08-25 NOTE — Progress Notes (Signed)
Patient ID: [name], male   DOB: 1965/05/13, 53 y.o.   MRN: [id number]  Pt was seen and chart reviewed with treatment team. Pt has multiple medical co-morbidities that are contributing to his depression and anxiety. He lives with his elderly parents. His PCP is prescribing him Clonazepam 0.5 mg BID for anxiety. He is willing to follow up outpatient for his depression. Pt is psychiatrically clear.   Ethelene Hal, FNP-C 08/25/2018      [phone]

## 2018-08-25 NOTE — Patient Outreach (Addendum)
[name] [facility]) Care Management  08/25/2018  [name] 1965-04-13 [id number]  Late Entry: CSW was able to make contact with patient last evening to follow-up regarding scheduling an appointment with a psychiatrist for medication management, as well as a therapist for psychotherapy services.  CSW explained to patient that CSW has still not received a return call from [name] with [facility], regarding scheduling an appointment for patient, but that CSW planned to place another call today.  Patient reported that this would not be necessary, as he has decided that he would like to receive services through [facility].  Patient went on to explain that his Gastroenterologist with [name], Dr. Silvano [name] encouraged him to seek services at [name], as Dr. Carlean [name] believed that the program they have to offer will provide more services than an outpatient psychiatric practice.  Patient reported that he has an appointment scheduled with [name], [name] with the [name], on Tuesday, August 29, 2018 at 12:00PM.  CSW congratulated patient for taking the initiative to schedule the appointment.  CSW agreed to follow-up with patient again on Thursday, August 31, 2018, around 10:00AM, to ensure that patient has been accepted into the Partial Hospitalization Program at [name] and that he is willing to pursue services.  Patient denied the need for CSW to provide telephonic counseling and supportive services, in the meantime.  Patient admitted that he continues to do well, denying suicidal or homicidal ideations, delusions and/or hallucinations.  Patient continues to take his psychiatric medications exactly as prescribed, hoping to be able to start on an antidepressant medication once he has met  with a psychiatrist at [name], [name], [name], [name]  Licensed Clinical Social Worker  [place]. [address] [place], [place] [address] Physical Address-[address], [place], [place] [address] Toll Free Main # [phone] Fax # [phone] Cell # [phone]  Office # [phone] [name]

## 2018-08-26 ENCOUNTER — Other Ambulatory Visit: Payer: Self-pay

## 2018-08-26 ENCOUNTER — Emergency Department (HOSPITAL_COMMUNITY)
Admission: EM | Admit: 2018-08-26 | Discharge: 2018-09-01 | Disposition: A | Discharge status: Other Acute Inpatient Hospital | Payer: Medicare Other | Attending: Emergency Medicine | Admitting: Emergency Medicine

## 2018-08-26 DIAGNOSIS — Z79899 Other long term (current) drug therapy: Secondary | ICD-10-CM | POA: Insufficient documentation

## 2018-08-26 DIAGNOSIS — R457 State of emotional shock and stress, unspecified: Secondary | ICD-10-CM | POA: Diagnosis not present

## 2018-08-26 DIAGNOSIS — Z03818 Encounter for observation for suspected exposure to other biological agents ruled out: Secondary | ICD-10-CM | POA: Diagnosis not present

## 2018-08-26 DIAGNOSIS — Z20828 Contact with and (suspected) exposure to other viral communicable diseases: Secondary | ICD-10-CM | POA: Diagnosis not present

## 2018-08-26 DIAGNOSIS — Z85828 Personal history of other malignant neoplasm of skin: Secondary | ICD-10-CM | POA: Insufficient documentation

## 2018-08-26 DIAGNOSIS — F332 Major depressive disorder, recurrent severe without psychotic features: Secondary | ICD-10-CM | POA: Diagnosis not present

## 2018-08-26 DIAGNOSIS — Z87891 Personal history of nicotine dependence: Secondary | ICD-10-CM | POA: Insufficient documentation

## 2018-08-26 DIAGNOSIS — R0602 Shortness of breath: Secondary | ICD-10-CM | POA: Diagnosis not present

## 2018-08-26 DIAGNOSIS — G8929 Other chronic pain: Secondary | ICD-10-CM | POA: Insufficient documentation

## 2018-08-26 DIAGNOSIS — R000 Tachycardia, unspecified: Secondary | ICD-10-CM | POA: Diagnosis not present

## 2018-08-26 DIAGNOSIS — R531 Weakness: Secondary | ICD-10-CM | POA: Diagnosis not present

## 2018-08-26 DIAGNOSIS — R45851 Suicidal ideations: Secondary | ICD-10-CM | POA: Diagnosis not present

## 2018-08-26 LAB — COMPREHENSIVE METABOLIC PANEL
ALT: 30 U/L (ref 0–44)
AST: 19 U/L (ref 15–41)
Albumin: 2.9 g/dL — ABNORMAL LOW (ref 3.5–5.0)
Alkaline Phosphatase: 82 U/L (ref 38–126)
Anion gap: 8 (ref 5–15)
BUN: 13 mg/dL (ref 6–20)
CO2: 26 mmol/L (ref 22–32)
Calcium: 8.3 mg/dL — ABNORMAL LOW (ref 8.9–10.3)
Chloride: 99 mmol/L (ref 98–111)
Creatinine, Ser: 0.93 mg/dL (ref 0.61–1.24)
GFR calc Af Amer: >60 mL/min (ref >60)
GFR calc non Af Amer: >60 mL/min (ref >60)
Glucose, Bld: 83 mg/dL (ref 70–99)
Potassium: 4 mmol/L (ref 3.5–5.1)
Sodium: 133 mmol/L — ABNORMAL LOW (ref 135–145)
Total Bilirubin: 0.7 mg/dL (ref 0.3–1.2)
Total Protein: 5.4 g/dL — ABNORMAL LOW (ref 6.5–8.1)

## 2018-08-26 LAB — CBC WITH DIFFERENTIAL/PLATELET
Abs Immature Granulocytes: 0.06 10*3/uL (ref 0.00–0.07)
Basophils Absolute: 0 10*3/uL (ref 0.0–0.1)
Basophils Relative: 0 %
Eosinophils Absolute: 0 10*3/uL (ref 0.0–0.5)
Eosinophils Relative: 0 %
HCT: 43.4 % (ref 39.0–52.0)
Hemoglobin: 13.7 g/dL (ref 13.0–17.0)
Immature Granulocytes: 0 %
Lymphocytes Relative: 2 %
Lymphs Abs: 0.3 10*3/uL — ABNORMAL LOW (ref 0.7–4.0)
MCH: 27.3 pg (ref 26.0–34.0)
MCHC: 31.6 g/dL (ref 30.0–36.0)
MCV: 86.5 fL (ref 80.0–100.0)
Monocytes Absolute: 0.7 10*3/uL (ref 0.1–1.0)
Monocytes Relative: 4 %
Neutro Abs: 14.1 10*3/uL — ABNORMAL HIGH (ref 1.7–7.7)
Neutrophils Relative %: 94 %
Platelets: 205 10*3/uL (ref 150–400)
RBC: 5.02 MIL/uL (ref 4.22–5.81)
RDW: 18.6 % — ABNORMAL HIGH (ref 11.5–15.5)
WBC: 15.1 10*3/uL — ABNORMAL HIGH (ref 4.0–10.5)
nRBC: 0 % (ref 0.0–0.2)

## 2018-08-26 LAB — URINALYSIS, ROUTINE W REFLEX MICROSCOPIC
Bilirubin Urine: NEGATIVE
Glucose, UA: NEGATIVE mg/dL
Hgb urine dipstick: NEGATIVE
Ketones, ur: 5 mg/dL — AB
Leukocytes,Ua: NEGATIVE
Nitrite: NEGATIVE
Protein, ur: NEGATIVE mg/dL
Specific Gravity, Urine: 1.016 (ref 1.005–1.030)
pH: 5 (ref 5.0–8.0)

## 2018-08-26 LAB — SARS CORONAVIRUS 2 BY RT PCR (HOSPITAL ORDER, PERFORMED IN ~~LOC~~ HOSPITAL LAB): SARS Coronavirus 2: NEGATIVE

## 2018-08-26 LAB — RAPID URINE DRUG SCREEN, HOSP PERFORMED
Amphetamines: NOT DETECTED
Barbiturates: NOT DETECTED
Benzodiazepines: POSITIVE — AB
Cocaine: NOT DETECTED
Opiates: NOT DETECTED
Tetrahydrocannabinol: NOT DETECTED

## 2018-08-26 LAB — ACETAMINOPHEN LEVEL: Acetaminophen (Tylenol), Serum: <10 ug/mL — ABNORMAL LOW (ref 10–30)

## 2018-08-26 LAB — ETHANOL: Alcohol, Ethyl (B): <10 mg/dL (ref <10)

## 2018-08-26 LAB — SALICYLATE LEVEL: Salicylate Lvl: <7 mg/dL (ref 2.8–30.0)

## 2018-08-26 MED ORDER — ONDANSETRON HCL 4 MG PO TABS
4.0000 mg | ORAL_TABLET | Freq: Three times a day (TID) | ORAL | Status: DC | PRN
  Administered 2018-08-27 (×2): 4 mg via ORAL
  Filled 2018-08-26 (×3): qty 1

## 2018-08-26 MED ORDER — THIAMINE HCL 100 MG/ML IJ SOLN
100.0000 mg | Freq: Once | INTRAMUSCULAR | Status: AC
  Administered 2018-08-26: 100 mg via INTRAVENOUS
  Filled 2018-08-26: qty 2

## 2018-08-26 MED ORDER — ACETAMINOPHEN 325 MG PO TABS
650.0000 mg | ORAL_TABLET | Freq: Once | ORAL | Status: AC
  Administered 2018-08-26: 650 mg via ORAL
  Filled 2018-08-26: qty 2

## 2018-08-26 MED ORDER — ALUM & MAG HYDROXIDE-SIMETH 200-200-20 MG/5ML PO SUSP
30.0000 mL | Freq: Four times a day (QID) | ORAL | Status: DC | PRN
  Administered 2018-08-27 – 2018-08-30 (×3): 30 mL via ORAL
  Filled 2018-08-26 (×3): qty 30

## 2018-08-26 MED ORDER — SODIUM CHLORIDE 0.9 % IV BOLUS
1000.0000 mL | Freq: Once | INTRAVENOUS | Status: AC
  Administered 2018-08-26: 1000 mL via INTRAVENOUS

## 2018-08-26 MED ORDER — ACETAMINOPHEN 325 MG PO TABS
650.0000 mg | ORAL_TABLET | ORAL | Status: DC | PRN
  Administered 2018-08-26 – 2018-08-27 (×3): 650 mg via ORAL
  Filled 2018-08-26 (×3): qty 2

## 2018-08-26 NOTE — ED Triage Notes (Signed)
Pt arrives for worsening depression from yesterday, feels lethargic and weak today. Complains "some" SOB and chest pain, attributes symptoms to depression. Psych cleared 08/26/18

## 2018-08-26 NOTE — ED Notes (Signed)
Patient wanded by security. 

## 2018-08-26 NOTE — ED Notes (Signed)
Patient placed in purple scrubs and personal items taken away ; sitter at bedside

## 2018-08-26 NOTE — BH Assessment (Addendum)
Assessment Note  [name] is an 52 y.o. male that presents this date with S/I. Patient denies any H/I or AVH. Patient is vague in reference to a plan although reports that "if he were to do it, it would be by overdosing." Patient renders conflicting history stating per note review that he was not having thoughts of self harm on admission. Patient has  an extensive past medical history including autoimmune hepatitis, avoidant restrictive food intake disorder, depression and Crohn's disease. Patient presents back this date after being discharged yesterday for on going depression requesting assistance with medication management. Patient states that since he was discharged yesterday his depression has worsened with symptoms to include: isolating and extreme fatigue. Patient reports two prior attempts at self harm and was last seen on 07/31/18 when he ingested 8 oz of bleach on that date. Patient reports he also overdosed in 2015 although denies he received any medical care for that event and states "He just woke up." Patient denies any current SA issues although reports a history ofalcoholuse20 years. Denies drug use. He is currently being prescribed clonopin for anxiety by his PCP and was taking Remeron for depression but discontinued it because he also thought it was causing worsening abdominal pain. Treatment has been complicated by continued severe depression and anxiety. He has a history of hospice care several years ago but he improved and no longer required that service.On interview, patient reports that his depression is circumstantial due toworsening symptoms of his medical conditionand presents this date to be evlauted for possible medication interventions. He was placed on disability in 2011.Patient declined any medical management for depression at that time and was cleared by psychiatry. Patient denies having a current psychiatric OP provider. Patient is oriented x 4 and speaks with normal  tone and volume. Case was staffed with Money NP who recommended a inpatient admission to assist with stabilization.     Diagnosis: F33.2 MDD recurrent without psychotic features, severe.  Past Medical History:  Past Medical History:  Diagnosis Date  . Allergy   . Anal fistula   . Anxiety   . 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 Autoimmune hepatitis ([place]) 01/19/2013  . Avoidant-restrictive food intake disorder (ARFID) ? 06/15/2018  . Cancer of skin of neck   . Cataract   . Chronic mesenteric ischemia ([place])   . Chronic pain syndrome 07/09/2016  . Crohn's disease of small and large intestines ([place])    followed by dr Glendell Docker gessner  . Dairy product intolerance   . Diarrhea, functional   . Family history of adverse reaction to anesthesia   . Grover's disease    transient acantholytic dermatosis  . History of alcohol abuse   . History of basal cell carcinoma excision    2013 left leg  . History of Clostridium difficile    10/ 2014  . History of multiple concussions    x6   last one Jan 2017 per pt--  no residual  . History of substance abuse ([place])    quit 1997 per pt  . History of suicide attempt    05-18-2012  overdose /  failure to thrive  . Hypercholesterolemia   . Iron deficiency anemia due to chronic blood loss   . Major depression, recurrent, chronic ([place])   . Osteopenia   . Personal history of adenomatous colonic polyps 12/2010, 03/2012   12/2010 - 8 mm serrated adenoma of rectum  . Portal vein thrombosis 03/21/2015   right  .  Primary sclerosing cholangitis    ? hepatitis overlap - liver bx x 2 and MRCP  . Seasonal allergies   . Substance abuse ([place]) 1997   Alcohol  . Vitamin A deficiency 05/23/2018  . Vitamin B6 deficiency 07/27/2018  . Vitamin C deficiency 05/23/2018    Past Surgical History:  Procedure Laterality Date  . ABDOMINAL AORTAGRAM N/A 03/29/2012   Procedure: ABDOMINAL Maxcine Ham;  Surgeon: Serafina Mitchell, MD;  Location: [facility] CATH LAB;  Service:  Cardiovascular;  Laterality: N/A;  . ADENOIDECTOMY  age 59  . BIOPSY  05/31/2018   Procedure: BIOPSY;  Surgeon: Milus Banister, MD;  Location: [facility] ENDOSCOPY;  Service: Endoscopy;;  . CATARACT EXTRACTION W/ INTRAOCULAR LENS  IMPLANT, BILATERAL  2009  . COLONOSCOPY  2001, 05/02/2003, 01/28/11   2012: Right colon Crohn's, rectal polyp  . COLONOSCOPY  03/31/2012   Procedure: COLONOSCOPY;  Surgeon: Jerene Bears, MD;  Location: [facility];  Service: Gastroenterology;  Laterality: N/A;  . COLONOSCOPY WITH PROPOFOL N/A 05/31/2018   Procedure: COLONOSCOPY WITH PROPOFOL;  Surgeon: Milus Banister, MD;  Location: [facility] ENDOSCOPY;  Service: Endoscopy;  Laterality: N/A;  . ESOPHAGOGASTRODUODENOSCOPY  01/28/11   Normal  . FOOT SURGERY Right age 35  . MOHS SURGERY Left 11/2013   left ankle parakerotosis   . PERCUTANEOUS LIVER BIOPSY  2007 and 2008  . PILONIDAL CYST EXCISION  age 60  . PLACEMENT OF SETON N/A 11/06/2015   Procedure: PLACEMENT OF SETON;  Surgeon: [name], MD;  Location: [facility];  Service: General;  Laterality: N/A;  . UPPER GASTROINTESTINAL ENDOSCOPY      Family History:  Family History  Problem Relation Age of Onset  . Heart disease Father   . Thyroid cancer Father   . Allergies Father   . Clotting disorder Father   . Breast cancer Mother   . Stomach cancer Mother   . Colon cancer Neg Hx   . Esophageal cancer Neg Hx   . Rectal cancer Neg Hx     Social History:  reports that he quit smoking about 21 years ago. His smoking use included cigarettes. He has a 15.00 pack-year smoking history. He has never used smokeless tobacco. He reports that he does not drink alcohol or use drugs.  Additional Social History:  Alcohol / Drug Use Pain Medications: See MAR Prescriptions: See MAR Over the Counter: See MAR History of alcohol / drug use?: No history of alcohol / drug abuse  CIWA: CIWA-Ar BP: 128/86 Pulse Rate: 94 COWS:    Allergies:  Allergies  Allergen  Reactions  . Asacol [Mesalamine] Diarrhea    abd pain  . Flagyl [Metronidazole] Diarrhea    abd pain  . Amoxicillin Other (See Comments)    Lots of gas  . Azathioprine Diarrhea    abd pain  . Gluten Meal Diarrhea  . Mycophenolate Mofetil Diarrhea    abd pain  . Other     NUTS; constipation  . Wheat Bran Diarrhea    Constipation; flatulence; abd pain  . Tape Itching and Rash    Please use "paper" tape    Home Medications: (Not in a hospital admission)   OB/GYN Status:  No LMP for male patient.  General Assessment Data Location of Assessment: [facility] ED TTS Assessment: In system Is this a Tele or Face-to-Face Assessment?: Face-to-Face Is this an Initial Assessment or a Re-assessment for this encounter?: Initial Assessment Patient Accompanied by:: N/A Language Other than English: No Living Arrangements: Other (  Comment) What gender do you identify as?: Male Marital status: Single Living Arrangements: Parent Can pt return to current living arrangement?: Yes Admission Status: Involuntary Petitioner: Family member Is patient capable of signing voluntary admission?: Yes Referral Source: Self/Family/Friend Insurance type: Medicaid  Medical Screening Exam ([place]) Medical Exam completed: Yes  Crisis Care Plan Living Arrangements: Parent Name of Psychiatrist: None Name of Therapist: none  Education Status Is patient currently in school?: No Is the patient employed, unemployed or receiving disability?: Unemployed  Risk to self with the past 6 months Suicidal Ideation: Yes-Currently Present Has patient been a risk to self within the past 6 months prior to admission? : Yes Suicidal Intent: Yes-Currently Present Has patient had any suicidal intent within the past 6 months prior to admission? : Yes Is patient at risk for suicide?: Yes Suicidal Plan?: No Has patient had any suicidal plan within the past 6 months prior to admission? : Yes Specify Current Suicidal  Plan: Drank bleach Access to Means: No Specify Access to Suicidal Means: NA What has been your use of drugs/alcohol within the last 12 months?: Denies Previous Attempts/Gestures: Yes How many times?: 2 Other Self Harm Risks: (NA) Triggers for Past Attempts: (NA) Intentional Self Injurious Behavior: None Family Suicide History: No Recent stressful life event(s): Other (Comment)(depression) Persecutory voices/beliefs?: No Depression: Yes Depression Symptoms: Feeling worthless/self pity Substance abuse history and/or treatment for substance abuse?: No Suicide prevention information given to non-admitted patients: Not applicable  Risk to Others within the past 6 months Homicidal Ideation: No Does patient have any lifetime risk of violence toward others beyond the six months prior to admission? : No Thoughts of Harm to Others: No Current Homicidal Intent: No Current Homicidal Plan: No Access to Homicidal Means: No Identified Victim: NA History of harm to others?: No Assessment of Violence: None Noted Violent Behavior Description: NA Does patient have access to weapons?: No Criminal Charges Pending?: No Does patient have a court date: No Is patient on probation?: No  Psychosis Hallucinations: None noted Delusions: None noted  Mental Status Report Appearance/Hygiene: In scrubs Eye Contact: Fair Motor Activity: Freedom of movement Speech: Logical/coherent Level of Consciousness: Alert Mood: Depressed Affect: Appropriate to circumstance Anxiety Level: Minimal Thought Processes: Coherent, Relevant Judgement: Partial Orientation: Person, Place, Time Obsessive Compulsive Thoughts/Behaviors: None  Cognitive Functioning Concentration: Normal Memory: Recent Intact, Remote Intact Is patient IDD: No Insight: Fair Impulse Control: Fair Appetite: Poor Have you had any weight changes? : No Change Sleep: No Change Total Hours of Sleep: 5 Vegetative Symptoms:  None  ADLScreening [facility] Assessment Services) Patient's cognitive ability adequate to safely complete daily activities?: Yes Patient able to express need for assistance with ADLs?: Yes Independently performs ADLs?: Yes (appropriate for developmental age)  Prior Inpatient Therapy Prior Inpatient Therapy: Yes Prior Therapy Dates: 2020 Prior Therapy Facilty/Provider(s): [facility] Reason for Treatment: MH issues  Prior Outpatient Therapy Prior Outpatient Therapy: No Does patient have an ACCT team?: No Does patient have Intensive In-House Services?  : No Does patient have [facility] services? : No Does patient have P4CC services?: No  ADL Screening (condition at time of admission) Patient's cognitive ability adequate to safely complete daily activities?: Yes Is the patient deaf or have difficulty hearing?: No Does the patient have difficulty seeing, even when wearing glasses/contacts?: No Does the patient have difficulty concentrating, remembering, or making decisions?: No Patient able to express need for assistance with ADLs?: Yes Does the patient have difficulty dressing or bathing?: No Independently performs ADLs?: Yes (  appropriate for developmental age) Does the patient have difficulty walking or climbing stairs?: No Weakness of Legs: None Weakness of Arms/Hands: None  Home Assistive Devices/Equipment Home Assistive Devices/Equipment: None  Therapy Consults (therapy consults require a physician order) PT Evaluation Needed: No OT Evalulation Needed: No SLP Evaluation Needed: No Abuse/Neglect Assessment (Assessment to be complete while patient is alone) Physical Abuse: Denies Verbal Abuse: Denies Sexual Abuse: Denies Exploitation of patient/patient's resources: Denies Self-Neglect: Denies Values / Beliefs Cultural Requests During Hospitalization: None Spiritual Requests During Hospitalization: None Consults Spiritual Care Consult Needed: No Social Work Consult Needed: No Armed forces training and education officer (For Healthcare) Does Patient Have a Medical Advance Directive?: No Would patient like information on creating a medical advance directive?: No - Patient declined          Disposition: Case was staffed with Money NP who recommended a inpatient admission to assist with stabilization.   Disposition Initial Assessment Completed for this Encounter: Yes Disposition of Patient: Admit Type of inpatient treatment program: Adult Patient refused recommended treatment: No Mode of transportation if patient is discharged/movement?: (Unk)  On Site Evaluation by:   Reviewed with Physician:    Mamie Nick 08/26/2018 5:30 PM

## 2018-08-26 NOTE — ED Notes (Addendum)
Pt arrived to Rm 48 via stretcher - wearing burgundy scrubs. Sitter w/pt. Pt aware of need for urine specimen and aware waiting on TTS assessment. Pt requesting Tylenol for chronic back pain.

## 2018-08-26 NOTE — ED Triage Notes (Signed)
Denies SI at this time

## 2018-08-26 NOTE — ED Provider Notes (Signed)
[facility] EMERGENCY DEPARTMENT Provider Note   CSN: [id number] Arrival date & time: 08/26/18  6606    History   Chief Complaint No chief complaint on file.   HPI [name] is a 53 y.o. male with an extensive past medical history including autoimmune hepatitis, avoidant restrictive food intake disorder, chronic mesenteric ischemia, Crohn's disease, history of alcohol repeat abuse in remission for 23 years, history of primary sclerosing cholangitis who returns to the emergency department today for worsening symptoms of depression.  The patient states that he was seen and cleared by psychiatry yesterday.  He is not taking any antidepressants but was referred to the outpatient follow-up.  He states that he feels "much more depressed" today.  He complains that he feels extremely lethargic and weak all over.  He has a history of previous suicide attempt including overdose and several months ago drank bleach because he wanted to kill himself.  Asked the patient if he feels suicidal today and he states "not right now." He does state that if he were going to kill himself he would try to overdose.  He is not eating or drinking well.  He is not sleeping well.Marland Kitchen  He denies any chest pain, shortness of breath, fevers, chills, nausea, vomiting, urinary symptoms.  He denies cough or new body aches.    HPI  Past Medical History:  Diagnosis Date   Allergy    Anal fistula    Anxiety    Arthritis    ? of migratory arthritis   Autoimmune hepatitis ([place]) 01/19/2013   Avoidant-restrictive food intake disorder (ARFID) ? 06/15/2018   Cancer of skin of neck    Cataract    Chronic mesenteric ischemia ([name])    Chronic pain syndrome 07/09/2016   Crohn's disease of small and large intestines ([place])    followed by dr Glendell Docker gessner   Dairy product intolerance    Diarrhea, functional    Family history of adverse reaction to anesthesia    Grover's disease    transient  acantholytic dermatosis   History of alcohol abuse    History of basal cell carcinoma excision    2013 left leg   History of Clostridium difficile    10/ 2014   History of multiple concussions    x6   last one Jan 2017 per pt--  no residual   History of substance abuse ([place])    quit 1997 per pt   History of suicide attempt    05-18-2012  overdose /  failure to thrive   Hypercholesterolemia    Iron deficiency anemia due to chronic blood loss    Major depression, recurrent, chronic ([place])    Osteopenia    Personal history of adenomatous colonic polyps 12/2010, 03/2012   12/2010 - 8 mm serrated adenoma of rectum   Portal vein thrombosis 03/21/2015   right   Primary sclerosing cholangitis    ? hepatitis overlap - liver bx x 2 and MRCP   Seasonal allergies    Substance abuse ([place]) 1997   Alcohol   Vitamin A deficiency 05/23/2018   Vitamin B6 deficiency 07/27/2018   Vitamin C deficiency 05/23/2018    Patient Active Problem List   Diagnosis Date Noted   Suicide attempt [facility])    Crohn's disease with complication ([place])    Vitamin B6 deficiency 07/27/2018   Vitamin E deficiency 07/27/2018   Abdominal pain 07/19/2018   Avoidant-restrictive food intake disorder (ARFID) ? 06/15/2018   Dilated intrahepatic bile  ducts    Vitamin C deficiency 05/23/2018   Vitamin A deficiency 05/23/2018   Loss of weight 04/07/2018   Grover's disease 11/04/2017   Chronic pain syndrome 07/09/2016   Perirectal fistula and abscess 05/18/2016   Portal vein thrombosis 03/28/2015   Insomnia 11/29/2014   Long-term use of immunosuppressant medication - Entyvio 11/28/2014   Posterior Anal fissure 10/03/2014   Anemia due to chronic blood loss 02/11/2014   Autoimmune hepatitis-PSC overlap 01/19/2013   Rash on trunk 12/31/2012   Arthralgia 08/28/2012   Severe protein-calorie malnutrition ([place]) 04/12/2012   Major depressive disorder, recurrent episode, severe ([place])  04/12/2012   Personal history of adenomatous colonic polyps 01/18/2012   Osteopenia 02/17/2011   Long term current use of systemic steroids 08/11/2010   Essential hypertension 08/09/2008   VITAMIN D DEFICIENCY 09/26/2007   HYPERLIPIDEMIA, SEVERE 09/26/2007   CROHN'S DISEASE, LARGE AND SMALL INTESTINES 03/28/2007    Past Surgical History:  Procedure Laterality Date   ABDOMINAL AORTAGRAM N/A 03/29/2012   Procedure: ABDOMINAL Maxcine Ham;  Surgeon: Serafina Mitchell, MD;  Location: [facility];  Service: Cardiovascular;  Laterality: N/A;   ADENOIDECTOMY  age 26   BIOPSY  05/31/2018   Procedure: BIOPSY;  Surgeon: Milus Banister, MD;  Location: [facility];  Service: Endoscopy;;   CATARACT EXTRACTION W/ INTRAOCULAR LENS  IMPLANT, BILATERAL  2009   COLONOSCOPY  2001, 05/02/2003, 01/28/11   2012: Right colon Crohn's, rectal polyp   COLONOSCOPY  03/31/2012   Procedure: COLONOSCOPY;  Surgeon: Jerene Bears, MD;  Location: [facility];  Service: Gastroenterology;  Laterality: N/A;   COLONOSCOPY WITH PROPOFOL N/A 05/31/2018   Procedure: COLONOSCOPY WITH PROPOFOL;  Surgeon: Milus Banister, MD;  Location: [facility];  Service: Endoscopy;  Laterality: N/A;   ESOPHAGOGASTRODUODENOSCOPY  01/28/11   Normal   FOOT SURGERY Right age 59   MOHS SURGERY Left 11/2013   left ankle parakerotosis    PERCUTANEOUS LIVER BIOPSY  2007 and 2008   PILONIDAL CYST EXCISION  age 68   [place] N/A 11/06/2015   Procedure: PLACEMENT OF SETON;  Surgeon: [name], MD;  Location: [facility];  Service: General;  Laterality: N/A;   UPPER GASTROINTESTINAL ENDOSCOPY          Home Medications    Prior to Admission medications   Medication Sig Start Date End Date Taking? Authorizing Provider  clonazePAM (KLONOPIN) 1 MG tablet Take 1 mg by mouth 2 (two) times daily.   Yes [provider]  HYDROcodone-acetaminophen (NORCO/VICODIN) 5-325 MG tablet Take 1 tablet by mouth  every 6 (six) hours as needed for moderate pain or severe pain. 07/25/18  Yes [name], MD  predniSONE (DELTASONE) 20 MG tablet Take 20 mg by mouth daily with breakfast.   Yes [provider]    Family History Family History  Problem Relation Age of Onset   Heart disease Father    Thyroid cancer Father    Allergies Father    Clotting disorder Father    Breast cancer Mother    Stomach cancer Mother    Colon cancer Neg Hx    Esophageal cancer Neg Hx    Rectal cancer Neg Hx     Social History Social History   Tobacco Use   Smoking status: Former Smoker    Packs/day: 1.00    Years: 15.00    Pack years: 15.00    Types: Cigarettes    Quit date: 03/28/1997    Years since  quitting: 21.4   Smokeless tobacco: Never Used  Substance Use Topics   Alcohol use: No    Alcohol/week: 0.0 standard drinks   Drug use: No    Comment: QUIT USING DRUGS IN 1997     Allergies   Asacol [mesalamine], Flagyl [metronidazole], Amoxicillin, Azathioprine, Gluten meal, Mycophenolate mofetil, Other, Wheat bran, and Tape   Review of Systems Review of Systems  Ten systems reviewed and are negative for acute change, except as noted in the HPI.   Physical Exam Updated Vital Signs BP 128/86    Pulse 94    Resp 16    SpO2 100%   Physical Exam Vitals signs and nursing note reviewed.  Constitutional:      General: He is not in acute distress.    Appearance: He is well-developed. He is ill-appearing. He is not diaphoretic.     Comments: Patient appears weak, malnourished.  He is very thin and gaunt  HENT:     Head: Normocephalic and atraumatic.  Eyes:     General: No scleral icterus.    Conjunctiva/sclera: Conjunctivae normal.  Neck:     Musculoskeletal: Normal range of motion and neck supple.  Cardiovascular:     Rate and Rhythm: Normal rate and regular rhythm.     Heart sounds: Normal heart sounds.  Pulmonary:     Effort: Pulmonary effort is normal. No respiratory  distress.     Breath sounds: Normal breath sounds.  Abdominal:     Palpations: Abdomen is soft.     Tenderness: There is no abdominal tenderness.  Skin:    General: Skin is warm and dry.  Neurological:     Mental Status: He is alert.  Psychiatric:        Mood and Affect: Mood is depressed. Affect is blunt and flat.      ED Treatments / Results  Labs (all labs ordered are listed, but only abnormal results are displayed) Labs Reviewed  COMPREHENSIVE METABOLIC PANEL - Abnormal; Notable for the following components:      Result Value   Sodium 133 (*)    Calcium 8.3 (*)    Total Protein 5.4 (*)    Albumin 2.9 (*)    All other components within normal limits  CBC WITH DIFFERENTIAL/PLATELET - Abnormal; Notable for the following components:   WBC 15.1 (*)    RDW 18.6 (*)    Neutro Abs 14.1 (*)    Lymphs Abs 0.3 (*)    All other components within normal limits  ACETAMINOPHEN LEVEL - Abnormal; Notable for the following components:   Acetaminophen (Tylenol), Serum <10 (*)    All other components within normal limits  SARS CORONAVIRUS 2 (HOSPITAL ORDER, [facility])  ETHANOL  SALICYLATE LEVEL  RAPID URINE DRUG SCREEN, HOSP PERFORMED  URINALYSIS, ROUTINE W REFLEX MICROSCOPIC    EKG EKG Interpretation  Date/Time:  Saturday August 26 2018 10:03:22 EDT Ventricular Rate:  89 PR Interval:    QRS Duration: 85 QT Interval:  359 QTC Calculation: 437 R Axis:   27 Text Interpretation:  Sinus rhythm Abnormal R-wave progression, early transition nonspecific ST/T changes improved since July 31 2018 Confirmed by Sherwood Gambler [id number]) on 08/26/2018 11:22:26 AM   Radiology No results found.  Procedures Procedures (including critical care time)  Medications Ordered in ED Medications  ondansetron (ZOFRAN) tablet 4 mg (4 mg Oral Refused 08/26/18 1250)  alum & mag hydroxide-simeth (MAALOX/MYLANTA) 200-200-20 MG/5ML suspension 30 mL (30 mLs Oral Refused 08/26/18  1250)  acetaminophen (TYLENOL) tablet 650 mg (650 mg Oral Given 08/26/18 1115)  sodium chloride 0.9 % bolus 1,000 mL (0 mLs Intravenous Stopped 08/26/18 1249)  thiamine (B-1) injection 100 mg (100 mg Intravenous Given 08/26/18 1115)     Initial Impression / Assessment and Plan / ED Course  I have reviewed the triage vital signs and the nursing notes.  Pertinent labs & imaging results that were available during my care of the patient were reviewed by me and considered in my medical decision making (see chart for details).  Clinical Course as of Aug 26 1623  Sat Aug 26, 2018  1144 Lymphocyte #(!): 0.3 [AH]    Clinical Course User Index [AH] Margarita Mail, PA-C       53 year old male with significant past medical history as stated above. I reviewed the patient's labs which shows mild hyponatremia, He has low protein levels which may be secondary to his autoimmune hepatitis or poor oral intake.  His white blood cell count has been steadily elevating and he has a new lymphocytopenia however his coronavirus test is negative and he is not complaining of any signs or symptoms of systemic infection. The patient appears to me to be exceedingly depressed and I ultimately think this patient would very much benefit from inpatient admission for severe major depressive disorder. He is not taking any medications and has multiple suicide attempts previously and I feel he is at least moderate risk for suicide attempt if he does not get help although he is denying suicidality at this time.  He is medically clear for psychiatric evaluation Final Clinical Impressions(s) / ED Diagnoses   Final diagnoses:  Severe episode of recurrent major depressive disorder, without psychotic features [facility])    ED Discharge Orders    None       Margarita Mail, PA-C 08/26/18 1625    Sherwood Gambler, MD 08/29/18 [phone]

## 2018-08-26 NOTE — BH Assessment (Signed)
[facility] Assessment Progress Note Case was staffed with Money NP who recommended a inpatient admission to assist with stabilization.

## 2018-08-26 NOTE — ED Notes (Signed)
ITEMS PLACED IN LOCKER #2

## 2018-08-27 LAB — CBG MONITORING, ED: Glucose-Capillary: 114 mg/dL — ABNORMAL HIGH (ref 70–99)

## 2018-08-27 MED ORDER — PREDNISONE 20 MG PO TABS
20.0000 mg | ORAL_TABLET | Freq: Every day | ORAL | Status: DC
  Administered 2018-08-27 – 2018-09-01 (×6): 20 mg via ORAL
  Filled 2018-08-27 (×6): qty 1

## 2018-08-27 MED ORDER — CLONAZEPAM 0.5 MG PO TABS
1.0000 mg | ORAL_TABLET | Freq: Two times a day (BID) | ORAL | Status: DC
  Administered 2018-08-27 – 2018-09-01 (×12): 1 mg via ORAL
  Filled 2018-08-27 (×12): qty 2

## 2018-08-27 NOTE — ED Notes (Signed)
Pt noted to be alert, sitting on side of bed. Sitter and RN continuing to encourage po fluids.

## 2018-08-27 NOTE — ED Notes (Signed)
C Couture, PA, aware of pt's c/o's generalized weakness, diarrhea, decreased appetite - he reports feels is r/t Crohn's.

## 2018-08-27 NOTE — ED Notes (Signed)
Pt ambulated in hallway and then ambulated to bathroom and back to room w/o difficulty. Pt noted to w/belching after eating lunch.

## 2018-08-27 NOTE — ED Notes (Signed)
1st Exam completed by Dr Sabra Heck - Copy faxed to [facility] - Copy sent to Medical Records - Original placed in folder for Magistrate - ALL 3 sets on clipboard.

## 2018-08-27 NOTE — ED Notes (Signed)
Breakfast tray ordered 

## 2018-08-27 NOTE — ED Notes (Signed)
Pt c/o generalized weakness, nausea, decreased appetite. States has had diarrhea last evening and this am d/t Crohn's. CBG 114. VSS. Pt denies symptoms being r/t withdrawal. Denies opioid/opiate abuse - states has taken 1/2 Hydrocodone recently. Pt appears pale. Pt

## 2018-08-27 NOTE — ED Notes (Signed)
Pt's brother, Gonzalo Waymire - [phone] - called - pt gave verbal permission for RN to advise him of tx plan. Brother advised pt lives w/their parents and that his health has been declining over past 6 months. States pt has had increased weight loss, generalized weakness, and "peaked".

## 2018-08-27 NOTE — Progress Notes (Signed)
Patient meets criteria for inpatient treatment. No appropriate or available beds at [facility]. CSW faxed referrals to the following facilities for review:  [facility]  [facility]  [facility]  [facility]  [facility]   TTS will continue to seek bed placement.  Chalmers Guest. Guerry Bruin, [name], [place] Work/Disposition Phone: [phone] Fax: [phone]

## 2018-08-27 NOTE — BHH Counselor (Signed)
TTS Re-assessment: Patient is alert and oriented x 4. He denies SI/HI/AVH. Patient reports frequent panic attacks and a number of physical complaints. Patient states he feels well enough psychiatrically for discharge.  Collateral information was obtained from patient's parents, Marx and Apolonio Schneiders [phone]): For 4 months patient has been in a "deep depression," having daily panic attacks, and making suicidal threats. They do not feel patient is safe with an outpatient level of care. They reports "he needs extensive therapy and an antidepressant."   Patient continues to meet in patient criteria. TTS to continue to seek placement.

## 2018-08-27 NOTE — ED Notes (Signed)
Pt called for rn pt states that he feels extra anxious at this time rn told pt that rn will look in the chart to see what kind of medication. rn will speak to the doctor at this time for medication.

## 2018-08-27 NOTE — ED Notes (Signed)
Pt noted to be lying on bed belching and moaning - stating "it is from my Crohn's". Declines dinner. C/o shoulder and abd pain "from gas". Advised pt to drink water rather than Ginger Ale - voiced understanding.

## 2018-08-27 NOTE — ED Notes (Signed)
Pt given Zofran and Klonopin as ordered. Encouraging pt to drink po fluids. Pt states "I don't feel like I am going to make it if y'all send me back out". Advised pt of tx plan - Inpt. Pt verbalized agreement w/tx plan.

## 2018-08-27 NOTE — ED Notes (Addendum)
Encouraging pt to drink po fluids - drank 1/2 cup of Ginger Ale and 1/2 cup of water. Pt continues to c/o generalized weakness. Voiced understanding of drinking po fluids - states "I'm just too weak".

## 2018-08-27 NOTE — ED Notes (Signed)
Pt ambulated to bathroom and back to room w/o difficulty. States is feeling some better. Ginger Ale given - encouraging pt to drink po fluids.

## 2018-08-27 NOTE — ED Provider Notes (Signed)
53 year old male with a history of Crohn's disease, autoimmune hepatitis, chronic mesenteric ischemia, primary sclerosing cholangitis, who presented to the ED with complaints of depression.  Evaluated by psychiatry and recommended inpatient treatment.  Alerted by nursing staff today that patient complaining of diarrhea and generalized weakness.  Evaluated patient.  He states that he feels generally weak especially when he stands up.  He denies any bloody stools.  He denies any abdominal pain, nausea or vomiting.  Denies unilateral weakness/numbness, or other neuro complaints.  Physical Exam  BP 91/70 (BP Location: Right Arm)   Pulse 89   Temp 97.8 F (36.6 C) (Oral)   Resp 16   SpO2 99%   Physical Exam Vitals signs and nursing note reviewed.  Constitutional:      Appearance: He is well-developed.     Comments: Chronically ill appearing, thin  HENT:     Head: Normocephalic and atraumatic.  Eyes:     Conjunctiva/sclera: Conjunctivae normal.  Neck:     Musculoskeletal: Neck supple.  Cardiovascular:     Rate and Rhythm: Normal rate and regular rhythm.     Pulses: Normal pulses.     Heart sounds: No murmur.  Pulmonary:     Effort: Pulmonary effort is normal. No respiratory distress.     Breath sounds: Normal breath sounds. No wheezing or rales.  Abdominal:     General: Bowel sounds are normal.     Palpations: Abdomen is soft.     Tenderness: There is no abdominal tenderness. There is no guarding or rebound.  Skin:    General: Skin is warm and dry.  Neurological:     Mental Status: He is alert.     Cranial Nerves: No cranial nerve deficit.     Comments: Mental Status:  Alert, thought content appropriate, able to give a coherent history. Speech fluent without evidence of aphasia. Able to follow 2 step commands without difficulty.  Motor:  Normal tone. Globally decreased but equal strength of BUE and BLE major muscle groups including strong and equal grip strength and  dorsiflexion/plantar flexion Sensory: light touch normal in all extremities.  Psychiatric:     Comments: Flat affect     ED Course/Procedures   Clinical Course as of Aug 26 1012  Sat Aug 26, 2018  1144 Lymphocyte #(!): 0.3 [AH]    Clinical Course User Index [AH] Margarita Mail, PA-C    Procedures   MDM   Briefly, 53 year old male with a history of major depressive disorder and recent suicide attempt presenting for evaluation of depression.  From psychiatric standpoint patient recommended for inpatient treatment.  Alerted by nursing staff today the patient complaining of generalized weakness and diarrhea.  On exam he does appear to be chronically ill.  His abdomen is soft and nontender.  He is denying any bloody diarrhea.  No neurologic complaints.  Lungs clear to auscultation bilaterally.  Heart was regular rate and rhythm.  Orthostatics are positive.  Have encouraged oral fluid intake and p.o. intake.  Because his abdomen is soft and nontender and he is not having any bloody stools.  Do not feel that repeat lab work or imaging indicated at this time.  We will continue to monitor patient's oral intake.    Bishop Dublin 08/27/18 1624    Isla Pence, MD 08/28/18 1451

## 2018-08-28 ENCOUNTER — Other Ambulatory Visit: Payer: Self-pay | Admitting: *Deleted

## 2018-08-28 LAB — URINALYSIS, ROUTINE W REFLEX MICROSCOPIC
Bilirubin Urine: NEGATIVE
Glucose, UA: NEGATIVE mg/dL
Hgb urine dipstick: NEGATIVE
Ketones, ur: 5 mg/dL — AB
Leukocytes,Ua: NEGATIVE
Nitrite: NEGATIVE
Protein, ur: NEGATIVE mg/dL
Specific Gravity, Urine: 1.008 (ref 1.005–1.030)
pH: 6 (ref 5.0–8.0)

## 2018-08-28 NOTE — ED Provider Notes (Signed)
5:19 AM  Patient awaiting psych placement.  Complaining urinary frequency.  After urinating he only has 25 mL in his bladder on bladder scan.  No history of prostate issues.  He is on prednisone and therefore has a leukocytosis because he has a history of Crohn's disease.  His initial urine showed no sign of infection.  No issues with his flow.  No fever.  Will repeat urinalysis and urine culture.   Layden Caterino, Delice Bison, DO 08/28/18 [phone]

## 2018-08-28 NOTE — ED Notes (Signed)
Pt ambulated to the bathroom with steady gait.

## 2018-08-28 NOTE — BHH Counselor (Signed)
TTS reassessment: Patient is alert and oriented x 4. He states that he is "doing a lot worse." Patient has numerous somatic complaints. Patient states he believes he has a UTI and that his Crohn's disease is "acting up." He states he believes he needs a medical admission. Patient's urinalysis is pending. From a psych stand point patient states he is "incredibly anxious" and endorsing passive SI. He denies HI/AVH. Patient continues to meet in patient criteria due to collateral information received from parents on 6/28.

## 2018-08-28 NOTE — ED Triage Notes (Signed)
TTS done

## 2018-08-28 NOTE — Patient Outreach (Signed)
[facility]) Care Management  08/28/2018  [name] 05-03-1965 [id number]   CSW noted that patient continues to reside in the Emergency Department at [facility], awaiting bed availability at one of the following inpatient psychiatric facilities:  [facility]  [facility]  [facility]  [facility]  [place] [facility]  [facility]  [facility]  [place]   Patient continues to endorse that he is experiencing worsening symptoms of depression and that he is feeling "extra anxious".  It is noted that patient has numerous somatic complaints and reports passive suicidal ideation.    CSW will continue to follow patient while at [facility] to learn of patient's discharge disposition.  CSW will resume social work services with patient once he has been discharged back into the community.  Nat Christen, [name], [name], LCSW  Licensed Education officer, [facility]  Mailing [place] [address] [place], [place] [address] Physical Address-[address], [place], [place] [address] Toll Free Main # [phone] Fax # [phone] Cell # [phone]  Office # [phone] [name]

## 2018-08-28 NOTE — ED Notes (Signed)
Pt expressed his concern for constantly going to the bathroom and having the urge to pee, RN bladder scan the patient and only found the volume of 25 MLs in the the bladder at this time. Told patient that the RN will bring this problem to the ER physician, RN made MD aware and was to put orders in to try to evaluate the problem.

## 2018-08-28 NOTE — ED Triage Notes (Signed)
Urine sent to lab 

## 2018-08-28 NOTE — ED Notes (Signed)
Pt's breakfast tray arrived

## 2018-08-28 NOTE — ED Notes (Signed)
Pt's lunch arrived

## 2018-08-28 NOTE — ED Provider Notes (Signed)
Emergency Medicine Observation Re-evaluation Note  [name] is a 53 y.o. male, seen on rounds today.  Pt initially presented to the ED for complaints of worsening depression. Currently, the patient is sleeping comfortably.  Physical Exam  BP (!) 143/88 (BP Location: Left Arm)   Pulse 79   Temp 97.7 F (36.5 C) (Oral)   Resp 18   SpO2 100%  Physical Exam Constitutional:      Appearance: Normal appearance.  Pulmonary:     Effort: Pulmonary effort is normal.  Neurological:     Mental Status: He is alert.     ED Course / MDM  EKG:EKG Interpretation  Date/Time:  Saturday August 26 2018 10:03:22 EDT Ventricular Rate:  89 PR Interval:    QRS Duration: 85 QT Interval:  359 QTC Calculation: 437 R Axis:   27 Text Interpretation:  Sinus rhythm Abnormal R-wave progression, early transition nonspecific ST/T changes improved since July 31 2018 Confirmed by Sherwood Gambler [phone]) on 08/26/2018 11:22:26 AM  Clinical Course as of Aug 27 941  Sat Aug 26, 2018  1144 Lymphocyte #(!): 0.3 [AH]    Clinical Course User Index [AH] Margarita Mail, PA-C   I have reviewed the labs performed to date as well as medications administered while in observation.  Recent changes in the last 24 hours include repeat urinalysis and urinary frequency given complaint of such; U/A clear at this time for signs of infection. Plan  Current plan is for inpatient admission. Patient is not under full IVC at this time.   Eustaquio Maize, PA-C 08/28/18 1801    Charlesetta Shanks, MD 08/29/18 [phone]

## 2018-08-29 ENCOUNTER — Ambulatory Visit (HOSPITAL_COMMUNITY): Payer: Medicare Other

## 2018-08-29 DIAGNOSIS — R45851 Suicidal ideations: Secondary | ICD-10-CM | POA: Diagnosis not present

## 2018-08-29 DIAGNOSIS — F332 Major depressive disorder, recurrent severe without psychotic features: Secondary | ICD-10-CM

## 2018-08-29 LAB — URINE CULTURE: Culture: NO GROWTH

## 2018-08-29 MED ORDER — FLUOXETINE HCL 10 MG PO CAPS
10.0000 mg | ORAL_CAPSULE | Freq: Every day | ORAL | Status: DC
  Administered 2018-08-29 – 2018-09-01 (×4): 10 mg via ORAL
  Filled 2018-08-29 (×4): qty 1

## 2018-08-29 NOTE — Consult Note (Addendum)
Telepsych Consultation   Reason for Consult:  Suicidal thoughts and depression Referring Physician:  EDP Location of Patient: [facility] ED [phone] Location of Provider: [facility]  Patient Identification: [name] MRN:  [id number] Principal Diagnosis: MDD (major depressive disorder), recurrent severe, without psychosis ([place]) Diagnosis:  Principal Problem:   MDD (major depressive disorder), recurrent severe, without psychosis ([place])   Total Time spent with patient: 15 minutes  Subjective: " I still feel about the same. I need help with my depression."   HPI: [name] is a 53 y.o. male requiring psychiatric consultation following reports of depression and suicidal ideations. During this evaluation, patient endorses no improvement in depressive symptoms. He has a significant health history and endorses his stressors and depression is mostly secondary to his health. When asked if he felt suicidal he replied, " I just don't feel much of anything."  Per his report, he has tried to commit suicide at least 3 times. Per review of chart, patient recently drank bleach in a suicide attempt. He denies homicidal ideations or psychosis. He endorses ongoing anxiety without improvement. He has a history of depression and reports he was once on Prozac with good results. At this time, he is not on any antidepressant medications. Patient lives with his parents. On 6/28/200, collateral information was collected from parents and they voiced concerns of safety and stated that they believed  patient would not be safe  safe with an outpatient level of care.     Past Psychiatric History: MDD, anxiety, multiple SA as per patient report   Risk to Self: Suicidal Ideation: Yes-Currently Present Suicidal Intent: Yes-Currently Present Is patient at risk for suicide?: Yes Suicidal Plan?: No Specify Current Suicidal Plan: Drank bleach Access to Means: No Specify Access to Suicidal Means:  NA What has been your use of drugs/alcohol within the last 12 months?: Denies How many times?: 2 Other Self Harm Risks: (NA) Triggers for Past Attempts: (NA) Intentional Self Injurious Behavior: None Risk to Others: Homicidal Ideation: No Thoughts of Harm to Others: No Current Homicidal Intent: No Current Homicidal Plan: No Access to Homicidal Means: No Identified Victim: NA History of harm to others?: No Assessment of Violence: None Noted Violent Behavior Description: NA Does patient have access to weapons?: No Criminal Charges Pending?: No Does patient have a court date: No Prior Inpatient Therapy: Prior Inpatient Therapy: Yes Prior Therapy Dates: 2020 Prior Therapy Facilty/Provider(s): [facility] Reason for Treatment: MH issues Prior Outpatient Therapy: Prior Outpatient Therapy: No Does patient have an ACCT team?: No Does patient have Intensive In-House Services?  : No Does patient have [facility] services? : No Does patient have P4CC services?: No  Past Medical History:  Past Medical History:  Diagnosis Date  . Allergy   . Anal fistula   . Anxiety   . 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 Autoimmune hepatitis ([place]) 01/19/2013  . Avoidant-restrictive food intake disorder (ARFID) ? 06/15/2018  . Cancer of skin of neck   . Cataract   . Chronic mesenteric ischemia ([place])   . Chronic pain syndrome 07/09/2016  . Crohn's disease of small and large intestines ([place])    followed by dr Glendell Docker gessner  . Dairy product intolerance   . Diarrhea, functional   . Family history of adverse reaction to anesthesia   . Grover's disease    transient acantholytic dermatosis  . History of alcohol abuse   . History of basal cell carcinoma excision    2013  left leg  . History of Clostridium difficile    10/ 2014  . History of multiple concussions    x6   last one Jan 2017 per pt--  no residual  . History of substance abuse ([place])    quit 1997 per pt  . History of suicide attempt    05-18-2012   overdose /  failure to thrive  . Hypercholesterolemia   . Iron deficiency anemia due to chronic blood loss   . Major depression, recurrent, chronic ([place])   . Osteopenia   . Personal history of adenomatous colonic polyps 12/2010, 03/2012   12/2010 - 8 mm serrated adenoma of rectum  . Portal vein thrombosis 03/21/2015   right  . Primary sclerosing cholangitis    ? hepatitis overlap - liver bx x 2 and MRCP  . Seasonal allergies   . Substance abuse ([place]) 1997   Alcohol  . Vitamin A deficiency 05/23/2018  . Vitamin B6 deficiency 07/27/2018  . Vitamin C deficiency 05/23/2018    Past Surgical History:  Procedure Laterality Date  . ABDOMINAL AORTAGRAM N/A 03/29/2012   Procedure: ABDOMINAL Maxcine Ham;  Surgeon: Serafina Mitchell, MD;  Location: [facility] CATH LAB;  Service: Cardiovascular;  Laterality: N/A;  . ADENOIDECTOMY  age 77  . BIOPSY  05/31/2018   Procedure: BIOPSY;  Surgeon: Milus Banister, MD;  Location: [facility];  Service: Endoscopy;;  . CATARACT EXTRACTION W/ INTRAOCULAR LENS  IMPLANT, BILATERAL  2009  . COLONOSCOPY  2001, 05/02/2003, 01/28/11   2012: Right colon Crohn's, rectal polyp  . COLONOSCOPY  03/31/2012   Procedure: COLONOSCOPY;  Surgeon: Jerene Bears, MD;  Location: [place];  Service: Gastroenterology;  Laterality: N/A;  . COLONOSCOPY WITH PROPOFOL N/A 05/31/2018   Procedure: COLONOSCOPY WITH PROPOFOL;  Surgeon: Milus Banister, MD;  Location: [facility] ENDOSCOPY;  Service: Endoscopy;  Laterality: N/A;  . ESOPHAGOGASTRODUODENOSCOPY  01/28/11   Normal  . FOOT SURGERY Right age 72  . MOHS SURGERY Left 11/2013   left ankle parakerotosis   . PERCUTANEOUS LIVER BIOPSY  2007 and 2008  . PILONIDAL CYST EXCISION  age 22  . PLACEMENT OF SETON N/A 11/06/2015   Procedure: PLACEMENT OF SETON;  Surgeon: [name], MD;  Location: [facility];  Service: General;  Laterality: N/A;  . UPPER GASTROINTESTINAL ENDOSCOPY     Family History:  Family History  Problem Relation Age of  Onset  . Heart disease Father   . Thyroid cancer Father   . Allergies Father   . Clotting disorder Father   . Breast cancer Mother   . Stomach cancer Mother   . Colon cancer Neg Hx   . Esophageal cancer Neg Hx   . Rectal cancer Neg Hx    Family Psychiatric  History: None noted in chart.  Social History:  Social History   Substance and Sexual Activity  Alcohol Use No  . Alcohol/week: 0.0 standard drinks     Social History   Substance and Sexual Activity  Drug Use No   Comment: QUIT USING DRUGS IN 1997    Social History   Socioeconomic History  . Marital status: Single    Spouse name: Not on file  . Number of children: 1  . Years of education: Not on file  . Highest education level: Not on file  Occupational History  . Occupation: Disability   Social Needs  . Financial resource strain: Not on file  . Food insecurity    Worry: Not on file  Inability: Not on file  . Transportation needs    Medical: Not on file    Non-medical: Not on file  Tobacco Use  . Smoking status: Former Smoker    Packs/day: 1.00    Years: 15.00    Pack years: 15.00    Types: Cigarettes    Quit date: 03/28/1997    Years since quitting: 21.4  . Smokeless tobacco: Never Used  Substance and Sexual Activity  . Alcohol use: No    Alcohol/week: 0.0 standard drinks  . Drug use: No    Comment: QUIT USING DRUGS IN 1997  . Sexual activity: Not Currently  Lifestyle  . Physical activity    Days per week: Not on file    Minutes per session: Not on file  . Stress: Not on file  Relationships  . Social Herbalist on phone: Not on file    Gets together: Not on file    Attends religious service: Not on file    Active member of club or organization: Not on file    Attends meetings of clubs or organizations: Not on file    Relationship status: Not on file  Other Topics Concern  . Not on file  Social History Narrative   Single, history of substance abuse in recovery   Previously  occupied on medical disability   1 child    Elderly parents involved and he helps them   1 brother in [place]   Additional Social History:    Allergies:   Allergies  Allergen Reactions  . Asacol [Mesalamine] Diarrhea    abd pain  . Flagyl [Metronidazole] Diarrhea    abd pain  . Amoxicillin Other (See Comments)    Lots of gas  . Azathioprine Diarrhea    abd pain  . Gluten Meal Diarrhea  . Mycophenolate Mofetil Diarrhea    abd pain  . Other     NUTS; constipation  . Wheat Bran Diarrhea    Constipation; flatulence; abd pain  . Tape Itching and Rash    Please use "paper" tape    Labs:  Results for orders placed or performed during the hospital encounter of 08/26/18 (from the past 48 hour(s))  Urinalysis, Routine w reflex microscopic     Status: Abnormal   Collection Time: 08/28/18  7:47 AM  Result Value Ref Range   Color, Urine YELLOW YELLOW   APPearance CLEAR CLEAR   Specific Gravity, Urine 1.008 1.005 - 1.030   pH 6.0 5.0 - 8.0   Glucose, UA NEGATIVE NEGATIVE mg/dL   Hgb urine dipstick NEGATIVE NEGATIVE   Bilirubin Urine NEGATIVE NEGATIVE   Ketones, ur 5 (A) NEGATIVE mg/dL   Protein, ur NEGATIVE NEGATIVE mg/dL   Nitrite NEGATIVE NEGATIVE   Leukocytes,Ua NEGATIVE NEGATIVE    Comment: Performed at [facility] [address] [place], [place] [address]  Urine culture     Status: None   Collection Time: 08/28/18  7:47 AM   Specimen: Urine, Random  Result Value Ref Range   Specimen Description URINE, RANDOM    Special Requests NONE    Culture      NO GROWTH Performed at [facility], [place] [address], [place], [place] [address]    Report Status 08/29/2018 FINAL     Medications:  Current Facility-Administered Medications  Medication Dose Route Frequency Provider Last Rate Last Dose  . acetaminophen (TYLENOL) tablet 650 mg  650 mg Oral Q4H PRN Lajean Saver, MD  650 mg at 08/27/18 1810  . alum & mag hydroxide-simeth (MAALOX/MYLANTA)  200-200-20 MG/5ML suspension 30 mL  30 mL Oral Q6H PRN Margarita Mail, PA-C   30 mL at 08/27/18 1810  . clonazePAM (KLONOPIN) tablet 1 mg  1 mg Oral BID Cardama, Grayce Sessions, MD   1 mg at 08/29/18 0853  . FLUoxetine (PROZAC) capsule 10 mg  10 mg Oral Daily Mordecai Maes, NP      . ondansetron [facility]) tablet 4 mg  4 mg Oral Q8H PRN Margarita Mail, PA-C   4 mg at 08/27/18 1810  . predniSONE (DELTASONE) tablet 20 mg  20 mg Oral Q breakfast Cardama, Grayce Sessions, MD   20 mg at 08/29/18 7106   Current Outpatient Medications  Medication Sig Dispense Refill  . clonazePAM (KLONOPIN) 1 MG tablet Take 1 mg by mouth 2 (two) times daily.    Marland Kitchen HYDROcodone-acetaminophen (NORCO/VICODIN) 5-325 MG tablet Take 1 tablet by mouth every 6 (six) hours as needed for moderate pain or severe pain. 30 tablet 0  . predniSONE (DELTASONE) 20 MG tablet Take 20 mg by mouth daily with breakfast.      Musculoskeletal: Unable to access and evaluation via telepsych   Psychiatric Specialty Exam: Physical Exam  Nursing note and vitals reviewed. Constitutional: He is oriented to person, place, and time.  Neurological: He is alert and oriented to person, place, and time.    Review of Systems  Psychiatric/Behavioral: Positive for depression. Negative for hallucinations, memory loss, substance abuse and suicidal ideas. The patient is nervous/anxious. The patient does not have insomnia.   All other systems reviewed and are negative.   Blood pressure 109/75, pulse 72, temperature 97.9 F (36.6 C), temperature source Oral, resp. rate 15, SpO2 100 %.There is no height or weight on file to calculate BMI.  General Appearance: Fairly Groomed  Eye Contact:  Fair  Speech:  Clear and Coherent and Normal Rate  Volume:  Decreased  Mood:  Anxious and Depressed  Affect:  Depressed  Thought Process:  Coherent, Goal Directed, Linear and Descriptions of Associations: Intact  Orientation:  Full (Time, Place, and Person)  Thought  Content:  WDL  Suicidal Thoughts:  When as ked about suicidal thoughts patient stated, " I just dont feel much of nothing."  Homicidal Thoughts:  No  Memory:  Immediate;   Fair Recent;   Fair  Judgement:  Fair  Insight:  Fair  Psychomotor Activity:  Normal  Concentration:  Concentration: Fair and Attention Span: Fair  Recall:  [employer] of Knowledge:  Fair  Language:  Good  Akathisia:  Negative  Handed:  Right  AIMS (if indicated):     Assets:  Communication Skills Desire for Improvement Resilience Social Support  ADL's:  Intact  Cognition:  WNL  Sleep:        Treatment Plan Summary: Daily contact with patient to assess and evaluate symptoms and progress in treatment  Disposition: Patient continues to meet inpatient criteria. He has significant risk factors (multiple medical conditions, history of SA, ongoing depression with no change) that makes him a high risk for suicide.  We discussed antidepressant therapy and patient states he was on Prozac many years ago which seemed to be helpful. Recommending to start Prozac 10 mg po daily for depression and anxiety and titrate as appropriate.   Notified nurse Jacqlyn Larsen of current disposition.   This service was provided via telemedicine using a 2-way, interactive audio and video technology.  Names of all persons participating  in this telemedicine service and their role in this encounter. Name: Mordecai Maes Role: FNP-C  Name: Magda Paganini  Role: Patient    Mordecai Maes, NP 08/29/2018 9:50 AM

## 2018-08-29 NOTE — ED Notes (Signed)
[name], [facility] - aware pt states his concussions he has occurred were when he was a child - last was as a child.

## 2018-08-29 NOTE — ED Notes (Signed)
Dinner tray ordered.

## 2018-08-29 NOTE — ED Notes (Signed)
Telepsych being performed. 

## 2018-08-29 NOTE — ED Notes (Signed)
Pt ambulated to bathroom, back to room w/o difficulty - then asked if he could ambulate down the hall. Advised pt he may ambulate in his room.

## 2018-08-29 NOTE — ED Notes (Signed)
Offered snack, declined by pt

## 2018-08-29 NOTE — Progress Notes (Signed)
Pt continues to meet inpatient criteria. Referral information has been resent to the following hospitals:   [facility]  [facility]  [facility]  [facility]  [facility]  [facility]  [place]       [place]  Disposition will continue to follow for inpatient placement needs.   Audree Camel, LCSW, [place] Disposition [facility] BHH/TTS [phone]

## 2018-08-29 NOTE — ED Notes (Signed)
Offered snack, pt declined

## 2018-08-29 NOTE — ED Notes (Signed)
Maalox given as requested.

## 2018-08-30 MED ORDER — ONDANSETRON 4 MG PO TBDP
4.0000 mg | ORAL_TABLET | Freq: Once | ORAL | Status: AC
  Administered 2018-08-30: 4 mg via ORAL
  Filled 2018-08-30: qty 1

## 2018-08-30 MED ORDER — HYDROXYZINE HCL 25 MG PO TABS
25.0000 mg | ORAL_TABLET | Freq: Once | ORAL | Status: AC
  Administered 2018-08-30: 25 mg via ORAL
  Filled 2018-08-30: qty 1

## 2018-08-30 MED ORDER — SUCRALFATE 1 GM/10ML PO SUSP
1.0000 g | Freq: Three times a day (TID) | ORAL | Status: DC
  Administered 2018-08-30 – 2018-09-01 (×3): 1 g via ORAL
  Filled 2018-08-30 (×14): qty 10

## 2018-08-30 NOTE — ED Notes (Signed)
Pt panicking because he feels as though he can't breathe assessed by PA and atarax given

## 2018-08-30 NOTE — ED Notes (Signed)
Pt. Has been burping, walking around the room and not feeling any better from this morning. Brought this to the attention to the Nurse.

## 2018-08-30 NOTE — ED Provider Notes (Signed)
Patient's nurse notified me that patient is experiencing a panic attack.  He was hyperventilating, complaining of tingling sensation around his mouth and around the tips of his fingers.  He feels he cannot breathe and states he worried that he cannot swallow.  On exam, patient is hyperventilating however able to speak in complete sentences.  Abdomen is soft nontender.  Vital signs stable, no hypoxia.  Vistaril given, encourage patient to take slow deep breaths.  Will monitor.   Domenic Moras, PA-C 08/30/18 1741    Carmin Muskrat, MD 08/30/18 [phone]

## 2018-08-30 NOTE — ED Notes (Addendum)
Pt continues to complain of SOB, burping, and inability to drink water. Pt is lying in bed in no observable distress Assured pt that his concerns will be passed on to PA for morning rounds. Vital signs being reassesed

## 2018-08-30 NOTE — ED Notes (Signed)
Pt resting in bed NAD. Says the pill helped some

## 2018-08-30 NOTE — ED Notes (Signed)
Pts. Lunch has arrived.

## 2018-08-30 NOTE — ED Notes (Signed)
Pt.s breakfast tray has arrived.

## 2018-08-30 NOTE — ED Notes (Signed)
Pt's complaints discussed with PA, discussed giving pt pedialyte to help with nausea/diahrrhea/dehydration

## 2018-08-30 NOTE — ED Notes (Signed)
Diet was ordered for Lunch.

## 2018-08-30 NOTE — Progress Notes (Signed)
CSW received call from [facility]. Pt is too "medically complex" for their facility.   Disposition will continue to follow.

## 2018-08-30 NOTE — ED Notes (Signed)
Patient refused a snack.

## 2018-08-30 NOTE — ED Provider Notes (Signed)
Pt reports he is having trouble swallowing and incresed belching.  He has not drank any water or fluids since 1am.  I will try zofran and carafate    Fransico Meadow, PA-C 08/30/18 1595    Charlesetta Shanks, MD 08/31/18 [phone]

## 2018-08-30 NOTE — ED Notes (Signed)
Patient ambulated to the bathroom when finished patient states he believes he is having an allergic reaction but could not state what he thought he might be having a reaction too; pt states he can not breath and c/o HA but declined Tylenol with offered pt states "It's not like a HA." Pt states he felt the same yesterday afternoon; RN monitored patient for a few minutes and vitals WNL;-Monique,RN

## 2018-08-31 ENCOUNTER — Other Ambulatory Visit: Payer: Self-pay | Admitting: *Deleted

## 2018-08-31 MED ORDER — HYDROXYZINE HCL 25 MG PO TABS
25.0000 mg | ORAL_TABLET | Freq: Three times a day (TID) | ORAL | Status: DC | PRN
  Administered 2018-09-01 (×2): 25 mg via ORAL
  Filled 2018-08-31 (×3): qty 1

## 2018-08-31 NOTE — ED Provider Notes (Signed)
Patient evaluated on morning rounds.  Patient is concerned about his diet causing him to have more diarrhea related to his Crohn's disease.  He requests a bland diet including chicken, mashed potatoes, eggs.  He is currently on prednisone 20 mg daily.  He is followed by Dr. Arelia Longest with gastroenterology.  Patient states he has been tried on several medications for his Crohn's for the past 20 years, but it was difficult to control.  Patient is also concerned about his anxiety attacks.  He is anticipating them.  Patient has scheduled Klonopin and as needed Vistaril.  Patient with flat affect lying comfortably in the bed.  Social work is actively seeking placement.   Frederica Kuster, PA-C 08/31/18 1032    Lacretia Leigh, MD 09/03/18 [phone]

## 2018-08-31 NOTE — ED Notes (Signed)
Dinner tray ordered.

## 2018-08-31 NOTE — Progress Notes (Signed)
Pt continues to meet inpatient placement criteria. Referral information has been resent to the following hospitals for review:  [facility], [facility], [facility], [facility], [place], [place] [address] Disposition [facility] B[facility]/TTS [phone]

## 2018-08-31 NOTE — Patient Outreach (Signed)
[facility]) Care Management  08/31/2018  [name] 02-May-1965 [id number]   [place] noted that patient remains hospitalized, awaiting bed availability at an inpatient psychiatric hospital.  Audree Camel, [facility] Licensed Clinical Social Worker at [facility], has re-faxed patient's information to all of the following inpatient psychiatric facilities to try and pursue bed offers.  Patient continues to endorse suicidal ideation, panic attacks and major depressive symptoms.  CSW will continue to follow patient while hospitalized to learn of discharge disposition.   [facility]  [facility]  [facility]  [facility]  [facility]       [place], [name], [name], LCSW  Licensed Clinical Social Worker  [place]  Mailing [place]. [address] [place], [place] [address] Physical Address-[address], [place], [place] [address] Toll Free Main # [phone] Fax # [phone] Cell # [phone]  Office # [phone] [name]

## 2018-09-01 DIAGNOSIS — Z85828 Personal history of other malignant neoplasm of skin: Secondary | ICD-10-CM | POA: Diagnosis not present

## 2018-09-01 DIAGNOSIS — R770 Abnormality of albumin: Secondary | ICD-10-CM | POA: Diagnosis not present

## 2018-09-01 DIAGNOSIS — Z13228 Encounter for screening for other metabolic disorders: Secondary | ICD-10-CM | POA: Diagnosis not present

## 2018-09-01 DIAGNOSIS — F411 Generalized anxiety disorder: Secondary | ICD-10-CM | POA: Diagnosis not present

## 2018-09-01 DIAGNOSIS — Z131 Encounter for screening for diabetes mellitus: Secondary | ICD-10-CM | POA: Diagnosis not present

## 2018-09-01 DIAGNOSIS — I10 Essential (primary) hypertension: Secondary | ICD-10-CM | POA: Diagnosis not present

## 2018-09-01 DIAGNOSIS — K5090 Crohn's disease, unspecified, without complications: Secondary | ICD-10-CM | POA: Diagnosis present

## 2018-09-01 DIAGNOSIS — F419 Anxiety disorder, unspecified: Secondary | ICD-10-CM | POA: Diagnosis not present

## 2018-09-01 DIAGNOSIS — E876 Hypokalemia: Secondary | ICD-10-CM | POA: Diagnosis not present

## 2018-09-01 DIAGNOSIS — K648 Other hemorrhoids: Secondary | ICD-10-CM | POA: Diagnosis not present

## 2018-09-01 DIAGNOSIS — D509 Iron deficiency anemia, unspecified: Secondary | ICD-10-CM | POA: Diagnosis not present

## 2018-09-01 DIAGNOSIS — F332 Major depressive disorder, recurrent severe without psychotic features: Secondary | ICD-10-CM | POA: Diagnosis not present

## 2018-09-01 DIAGNOSIS — R239 Unspecified skin changes: Secondary | ICD-10-CM | POA: Diagnosis not present

## 2018-09-01 DIAGNOSIS — Z20828 Contact with and (suspected) exposure to other viral communicable diseases: Secondary | ICD-10-CM | POA: Diagnosis not present

## 2018-09-01 DIAGNOSIS — E785 Hyperlipidemia, unspecified: Secondary | ICD-10-CM | POA: Diagnosis not present

## 2018-09-01 DIAGNOSIS — D649 Anemia, unspecified: Secondary | ICD-10-CM | POA: Diagnosis not present

## 2018-09-01 DIAGNOSIS — R030 Elevated blood-pressure reading, without diagnosis of hypertension: Secondary | ICD-10-CM | POA: Diagnosis not present

## 2018-09-01 DIAGNOSIS — L909 Atrophic disorder of skin, unspecified: Secondary | ICD-10-CM | POA: Diagnosis not present

## 2018-09-01 DIAGNOSIS — E559 Vitamin D deficiency, unspecified: Secondary | ICD-10-CM | POA: Diagnosis not present

## 2018-09-01 DIAGNOSIS — M8580 Other specified disorders of bone density and structure, unspecified site: Secondary | ICD-10-CM | POA: Diagnosis not present

## 2018-09-01 DIAGNOSIS — Z1329 Encounter for screening for other suspected endocrine disorder: Secondary | ICD-10-CM | POA: Diagnosis not present

## 2018-09-01 DIAGNOSIS — G894 Chronic pain syndrome: Secondary | ICD-10-CM | POA: Diagnosis not present

## 2018-09-01 DIAGNOSIS — F329 Major depressive disorder, single episode, unspecified: Secondary | ICD-10-CM | POA: Diagnosis not present

## 2018-09-01 DIAGNOSIS — R5383 Other fatigue: Secondary | ICD-10-CM | POA: Diagnosis not present

## 2018-09-01 DIAGNOSIS — Z87891 Personal history of nicotine dependence: Secondary | ICD-10-CM | POA: Diagnosis not present

## 2018-09-01 DIAGNOSIS — G8929 Other chronic pain: Secondary | ICD-10-CM | POA: Diagnosis not present

## 2018-09-01 DIAGNOSIS — G4700 Insomnia, unspecified: Secondary | ICD-10-CM | POA: Diagnosis not present

## 2018-09-01 DIAGNOSIS — Z5181 Encounter for therapeutic drug level monitoring: Secondary | ICD-10-CM | POA: Diagnosis not present

## 2018-09-01 DIAGNOSIS — Z79899 Other long term (current) drug therapy: Secondary | ICD-10-CM | POA: Diagnosis not present

## 2018-09-01 MED ORDER — NYSTATIN 100000 UNIT/GM EX POWD
Freq: Two times a day (BID) | CUTANEOUS | Status: DC
  Administered 2018-09-01: 13:00:00 via TOPICAL
  Filled 2018-09-01: qty 15

## 2018-09-01 NOTE — ED Notes (Signed)
IVC expires today. [facility] called and want pts IVC renewed because their court date is not until next Thursday. Spoke with charge RN and MD on process to renew IVC. Will call [facility] to see what steps to renew his IVC.

## 2018-09-01 NOTE — ED Notes (Signed)
Pt. Belongings removed from locker 2 and signed for by pt.

## 2018-09-01 NOTE — ED Notes (Signed)
Called the magistrate on duty to ask about renewing IVC papers. States that complete IVC and first exam have to be redone and sent over. States that it is because that IVC has only 7 days on it and the dates cannot be changed due to it being a legal document. Charge RN and MD made aware. MD will start working on new IVC papers for pt.

## 2018-09-01 NOTE — ED Notes (Signed)
Breakfast tray ordered 

## 2018-09-01 NOTE — ED Notes (Signed)
Called [facility] to ask about process to renew IVC and [facility] inquired why this pt was going to [facility]. States that pt may be able to come to [facility]. Will call me back.

## 2018-09-01 NOTE — ED Notes (Signed)
Spoke with [facility] at [facility] again. Pt to go to [facility] since already has a bed there and are more pts here that [facility] is working on admitting.

## 2018-09-01 NOTE — BHH Counselor (Signed)
Clinician received a call from [place] at [facility]. Pt has been accepted and can come after 0900. Attending physician: Dr. Rosina Lowenstein. Nursing report: [phone]. Discussed with Janett Billow, RN.   Pt's IVC paperwork to be faxed to [employer].    Vertell Novak, [place], [facility], [facility] Triage Specialist [phone]

## 2018-09-01 NOTE — ED Notes (Signed)
Sheriff at bedside to transport patient to [facility].

## 2018-09-01 NOTE — ED Notes (Signed)
Patient refused a snack, [facility] Diet was called in for Lunch.

## 2018-09-01 NOTE — ED Notes (Signed)
[facility] department called and left voicemail regarding needing transport for patient to [facility].

## 2018-09-01 NOTE — ED Provider Notes (Signed)
Emergency Medicine Observation Re-evaluation Note  [name] is a 53 y.o. male, seen on rounds today.  Pt initially presented to the ED for complaints of depression Currently, the patient is awake, alert, complaining of a rash on his bottom due to his diarrhea/crohns. Nurse has offered barrier cream which is not helping.  Physical Exam  BP (!) 142/90 (BP Location: Left Arm)   Pulse 73   Temp 98 F (36.7 C) (Oral)   Resp 18   SpO2 100%  Physical Exam RN chaperone present, red rash around anus with satellite lesions. Drain in place for fistula.   ED Course / MDM  EKG:EKG Interpretation  Date/Time:  Saturday August 26 2018 10:03:22 EDT Ventricular Rate:  89 PR Interval:    QRS Duration: 85 QT Interval:  359 QTC Calculation: 437 R Axis:   27 Text Interpretation:  Sinus rhythm Abnormal R-wave progression, early transition nonspecific ST/T changes improved since July 31 2018 Confirmed by Sherwood Gambler [phone]) on 08/26/2018 11:22:26 AM  Clinical Course as of Aug 31 913  Sat Aug 26, 2018  1144 Lymphocyte #(!): 0.3 [AH]    Clinical Course User Index [AH] Margarita Mail, PA-C   I have reviewed the labs performed to date as well as medications administered while in observation.  Recent changes in the last 24 hours include rash as above. Plan  [facility]. Will order Nystatin cream for rash, will dry mesh underwear with pad instead of brief, try SITZ baths. Patient is under full IVC at this time.   Tacy Learn, PA-C 09/01/18 [place]in[place], [place], DO 09/01/18 1303

## 2018-09-01 NOTE — ED Notes (Signed)
Pt to be transferred to [facility] after 0900 today. IVC paperwork faxed to [facility] @ [phone].

## 2018-09-05 ENCOUNTER — Other Ambulatory Visit: Payer: Self-pay | Admitting: *Deleted

## 2018-09-05 NOTE — Patient Outreach (Signed)
[facility]) Care Management  09/05/2018  FIELDS OROS Oct 10, 1965 [id number]   [place] noted that patient was transported by a sheriff from the [facility] to [facility] on Friday, September 01, 2018 to continue to receive psychotherapeutic services for symptoms of Anxiety, Depression and Suicidal Ideation.  CSW will continue to monitor patient while hospitalized and then resume social work services once patient has been discharged back into the community.  CSW will make an outreach attempt next week.  Nat Christen, [name], [name], LCSW  Licensed Education officer, environmental Health System  Mailing [place] [address], [place], [place] [address] Physical Address-[address], [place], [place] [address] Toll Free Main # [phone] Fax # [phone] Cell # [phone]  Office # [phone] [name]

## 2018-09-06 ENCOUNTER — Ambulatory Visit: Payer: Self-pay | Admitting: *Deleted

## 2018-09-12 ENCOUNTER — Other Ambulatory Visit: Payer: Self-pay | Admitting: *Deleted

## 2018-09-12 NOTE — Patient Outreach (Signed)
[facility]) Care Management  09/12/2018  [name] [birth year] [id number]   CSW made an attempt to try and contact patient today to follow-up regarding counseling and supportive services for symptoms of anxiety, depression and suicidal ideation; however, patient was not available at the time of CSW's call.  CSW was able to speak with patient's mother, [name], with whom patient currently resides, because she answered the phone.  HIPAA compliant identifiers were confirmed with Mrs. Wynetta Emery.  Mrs. Wynetta Emery reported that patient remains hospitalized at [facility], where he was admitted on Friday, September 01, 2018 for psychotherapeutic services.  Mrs. Wynetta Emery went on to say that patient is scheduled to be released on Saturday, September 16, 2018; therefore, CSW will make arrangements to follow-up with patient on Monday, September 18, 2018, around 10:00AM.  Nat Christen, [name], [name], [employer]  Licensed Clinical Social Worker  [place]  Mailing [place] [address] [place], [place] [address] Physical Address-[address], [place], [place] [address] Toll Free Main # [phone] Fax # [phone] Cell # [phone]  Office # [phone] [email or web address]

## 2018-09-18 ENCOUNTER — Other Ambulatory Visit: Payer: Self-pay | Admitting: *Deleted

## 2018-09-18 NOTE — Patient Outreach (Signed)
[place] [facility]) Care Management  09/18/2018  [name] [birth year] [id number]   CSW has made several attempts to try and contact patient today to follow-up regarding patient's recent hospital stay for treatment of anxiety, depression and suicidal ideation; however, patient was not available at the time of CSW's calls.  CSW was unable to leave a HIPAA compliant message for patient on voicemail, as patient's phone just continued to ring with no answer.  CSW will make a second outreach attempt within the next 3-4 business days, if a return call is not received from patient in the meantime.  Nat Christen, [name], [name], LCSW  Licensed Education officer, environmental Health System  Mailing [place] [address] [place], [place] [address] Physical Address-[address], [place], [place] [address] Toll Free Main # [phone] Fax # [phone] Cell # [phone]  Office # [phone] [name]

## 2018-09-22 ENCOUNTER — Other Ambulatory Visit: Payer: Self-pay | Admitting: *Deleted

## 2018-09-22 ENCOUNTER — Encounter: Payer: Self-pay | Admitting: *Deleted

## 2018-09-22 NOTE — Patient Outreach (Signed)
[place] [facility]) Care Management  09/22/2018  CLAUDIE RATHBONE [birth year] [id number]   CSW made a second attempt to try and contact patient today to follow-up regarding patient's recent hospital stay for treatment of anxiety, depression and suicidal ideation, as well as ensure that patient has been set up with an outpatient therapist and psychiatrist for medication management and psychotherapeutic services.  However, patient was not available at the time of CSW's call.  CSW was unable to leave a HIPAA compliant message for patient on voicemail, as patient's phone just continued to ring with no answer.  Patient currently resides with his parents and has only provided this one contact number to reach him.  CSW will make a third and final outreach attempt within the next 3-4 business days, if a return call is not received from patient in the meantime.  CSW will also mail an unsuccessful outreach letter to patient's home today, encouraging patient to contact CSW directly if he is interested in continuing to receive social work services through [place] with [facility].  CSW will then proceed with case closure if a return call is not received from patient with a total of 10 business days, as required number of phone attempts will have been made and outreach letter mailed.   Nat Christen, [name], [name], LCSW  Licensed Education officer, environmental Health System  Mailing [place] [address], [place], [place] [address] Physical Address-[address], [place], [place] [address] Toll Free Main # [phone] Fax # [phone] Cell # [phone]  Office # [phone] [name]

## 2018-09-27 ENCOUNTER — Other Ambulatory Visit: Payer: Self-pay

## 2018-09-27 MED ORDER — CIPROFLOXACIN HCL 500 MG PO TABS: 500.0000 mg | ORAL_TABLET | Freq: Two times a day (BID) | ORAL | 0 refills | Status: DC

## 2018-09-28 ENCOUNTER — Other Ambulatory Visit: Payer: Self-pay | Admitting: *Deleted

## 2018-09-28 DIAGNOSIS — F3181 Bipolar II disorder: Secondary | ICD-10-CM | POA: Diagnosis not present

## 2018-09-28 NOTE — Patient Outreach (Signed)
[place] [facility]) Care Management  09/28/2018  AYDN FERRARA 1965-12-21 [id number]   CSW covering for [employer], LCSW, made contact with pt today and confirmed his identity. Pt reports he is going to see a Psychiatrist today. "I have appointment at 1:30" and this will be his initial visit. CSW offered to call back later today/tomorrow and pt agreeable to callback tomorrow.   CSW will also plan update to assigned LCSW upon her return.   [name], [name], [place] Worker  [place] [phone]

## 2018-09-29 ENCOUNTER — Other Ambulatory Visit: Payer: Self-pay | Admitting: *Deleted

## 2018-09-29 NOTE — Patient Outreach (Signed)
[facility]) Care Management  09/29/2018  [name] Jan 02, 1966 [id number]   CSW was able to reach pt again today and identity confirmed. Pt reports he did have his initial visit on yesterday at [facility] with his mental health provider. "I like them" and states he plans to see them again on 10/12/2018.   CSW discussed the importance of feeling connected and comfortable for overall success and goals set with therapy/mental health work. Pt agrees and is feeling good about his plans. No medication changes made at appointment.   CSW will update assigned CSW upon her return for further assessment and interventions.   [name], [name], [place] Worker  [place] [phone]

## 2018-10-03 ENCOUNTER — Ambulatory Visit (INDEPENDENT_AMBULATORY_CARE_PROVIDER_SITE_OTHER): Payer: Medicare Other | Admitting: Internal Medicine

## 2018-10-03 ENCOUNTER — Encounter: Payer: Self-pay | Admitting: Internal Medicine

## 2018-10-03 DIAGNOSIS — G894 Chronic pain syndrome: Secondary | ICD-10-CM | POA: Diagnosis not present

## 2018-10-03 DIAGNOSIS — K50814 Crohn's disease of both small and large intestine with abscess: Secondary | ICD-10-CM

## 2018-10-03 DIAGNOSIS — K754 Autoimmune hepatitis: Secondary | ICD-10-CM | POA: Diagnosis not present

## 2018-10-03 DIAGNOSIS — F5105 Insomnia due to other mental disorder: Secondary | ICD-10-CM | POA: Diagnosis not present

## 2018-10-03 DIAGNOSIS — F99 Mental disorder, not otherwise specified: Secondary | ICD-10-CM | POA: Diagnosis not present

## 2018-10-03 DIAGNOSIS — E531 Pyridoxine deficiency: Secondary | ICD-10-CM | POA: Diagnosis not present

## 2018-10-03 DIAGNOSIS — D500 Iron deficiency anemia secondary to blood loss (chronic): Secondary | ICD-10-CM | POA: Diagnosis not present

## 2018-10-03 DIAGNOSIS — E54 Ascorbic acid deficiency: Secondary | ICD-10-CM

## 2018-10-03 DIAGNOSIS — E560 Deficiency of vitamin E: Secondary | ICD-10-CM | POA: Diagnosis not present

## 2018-10-03 DIAGNOSIS — F5082 Avoidant/restrictive food intake disorder: Secondary | ICD-10-CM

## 2018-10-03 DIAGNOSIS — K604 Rectal fistula: Secondary | ICD-10-CM

## 2018-10-03 DIAGNOSIS — F332 Major depressive disorder, recurrent severe without psychotic features: Secondary | ICD-10-CM

## 2018-10-03 NOTE — Assessment & Plan Note (Signed)
Has been quiesecent

## 2018-10-03 NOTE — Progress Notes (Signed)
TELEHEALTH ENCOUNTER IN SETTING OF COVID-19 PANDEMIC - REQUESTED BY PATIENT SERVICE PROVIDED BY TELEMEDECINE - TYPE:telephone PATIENT LOCATION: home PATIENT HAS CONSENTED TO TELEHEALTH VISIT PROVIDER LOCATION: OFFICE REFERRING PROVIDER N/A PARTICIPANTS OTHER THAN PATIENT:none TIME SPENT ON CALL:16 mins 22 secs    [name] 53 y.o. [birth year] [id number]  Assessment & Plan:   CROHN'S DISEASE, LARGE AND SMALL INTESTINES Psychiatric issues have taken priority Seems like perianal/rectal discharge better on cipro F/U me 1 month Stay on prednisone Work on getting back to [employer] as mental health improves and allows  Chronic pain syndrome Still using hydrocodone fore peri-rectal pain  Autoimmune hepatitis-PSC overlap Has been quiesecent  Anemia due to chronic blood loss Lab Results  Component Value Date   WBC 15.1 (H) 08/26/2018   HGB 13.7 08/26/2018   HCT 43.4 08/26/2018   MCV 86.5 08/26/2018   PLT 205 08/26/2018   Lab Results  Component Value Date   FERRITIN 106 07/21/2018     Vitamin B6 deficiency Not on supplementation Will follow-up with him about this  Vitamin C deficiency Getting 70% RDA in [place] 2/day  Vitamin E deficiency Not on Tx Will f/u with him about this.  Perirectal fistula and abscess Seton in place Another area of drainage by report - better after cipro  Insomnia Work with psychiatry Try 2 mg clonazepam qhs  Avoidant-restrictive food intake disorder (ARFID) ? This seems a bit better - but he needs to continue to work with psychiatry   MDD (major depressive disorder), recurrent severe, without psychosis ([place]) Seems improved but not on therapy due to side effects w/ Abilify and Lexapro Hopefully psychiatry will be able to find something that works  Mirtazapine helped in past but when we retried it this year he claimed it made Crohn's worse and stopped it Sure would help sleep and appetitie        Subjective:   Chief Complaint: Follow-up of Crohn's disease and multiple other issues  HPI [name] is having a follow-up visit regarding his Crohn's ileocolitis and perianal disease.  He had contacted me and said he was having some yellowish drainage and needed antibiotics so I prescribed Cipro last week and he reports much less drainage.  He said this was coming from an area different than his seton.  He still has some seton pain i.e. when he sits he has pain and has pain there and is using his hydrocodone some.  He had an inpatient 2-week hospitalization at a facility in [facility].  [facility], he said that that helped.  It looks like they prescribed Abilify and Lexapro but he said both of those made him dizzy and short of breath so he stopped them.  He is taking gabapentin 600 mg at bedtime and metoprolol 25 mg at bedtime.  Clonazepam at bedtime as well sometimes a half a tablet during the day.  He reports that he is eating better but is not eating a normal diet yet.  He is drinking 2 supplemental shakes [employer] daily.  They have some vitamin A and vitamin C.  He is eating bread and [place] and thinks he may have gained a little weight but does not elaborate.  He is struggling a lot with insomnia.  He has a psychiatry follow-up or appointment to establish care through [facility] on August 13.  He denies any abdominal pain.  6-7 bowel movements a day with diarrhea no bleeding.  Appetite is  somewhat better.  Previously he was having quite a bit of postprandial pain and stopped medications because he said they made him hurt. Allergies  Allergen Reactions  . Asacol [Mesalamine] Diarrhea    abd pain  . Flagyl [Metronidazole] Diarrhea    abd pain  . Amoxicillin Other (See Comments)    Lots of gas  . Azathioprine Diarrhea    abd pain  . Gluten Meal Diarrhea  . Mycophenolate Mofetil Diarrhea    abd pain  . Other     NUTS; constipation  . Wheat  Bran Diarrhea    Constipation; flatulence; abd pain  . Tape Itching and Rash    Please use "paper" tape   Current Meds  Medication Sig  . clonazePAM (KLONOPIN) 1 MG tablet Take 1 mg by mouth 2 (two) times daily.  Marland Kitchen gabapentin (NEURONTIN) 600 MG tablet Take 600 mg by mouth at bedtime.  [place] HYDROcodone-acetaminophen (NORCO/VICODIN) 5-325 MG tablet Take 1 tablet by mouth every 6 (six) hours as needed for moderate pain or severe pain.  . metoprolol tartrate (LOPRESSOR) 25 MG tablet Take 25 mg by mouth daily.  . predniSONE (DELTASONE) 20 MG tablet Take 20 mg by mouth daily with breakfast.   Past Medical History:  Diagnosis Date  . Allergy   . Anal fistula   . Anxiety   . 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 Autoimmune hepatitis ([place]) 01/19/2013  . Avoidant-restrictive food intake disorder (ARFID) ? 06/15/2018  . Cancer of skin of neck   . Cataract   . Chronic mesenteric ischemia ([place])   . Chronic pain syndrome 07/09/2016  . Crohn's disease of small and large intestines ([place])    followed by dr Glendell Docker gessner  . Dairy product intolerance   . Diarrhea, functional   . Family history of adverse reaction to anesthesia   . Grover's disease    transient acantholytic dermatosis  . History of alcohol abuse   . History of basal cell carcinoma excision    2013 left leg  . History of Clostridium difficile    10/ 2014  . History of multiple concussions    x6   last one Jan 2017 per pt--  no residual  . History of substance abuse ([place])    quit 1997 per pt  . History of suicide attempt    05-18-2012  overdose /  failure to thrive  . Hypercholesterolemia   . Iron deficiency anemia due to chronic blood loss   . Major depression, recurrent, chronic ([place])   . Osteopenia   . Personal history of adenomatous colonic polyps 12/2010, 03/2012   12/2010 - 8 mm serrated adenoma of rectum  . Portal vein thrombosis 03/21/2015   right  . Primary sclerosing cholangitis    ? hepatitis overlap - liver bx  x 2 and MRCP  . Seasonal allergies   . Substance abuse ([place]) 1997   Alcohol  . Vitamin A deficiency 05/23/2018  . Vitamin B6 deficiency 07/27/2018  . Vitamin C deficiency 05/23/2018   Past Surgical History:  Procedure Laterality Date  . ABDOMINAL AORTAGRAM N/A 03/29/2012   Procedure: ABDOMINAL Maxcine Ham;  Surgeon: Serafina Mitchell, MD;  Location: [facility] CATH LAB;  Service: Cardiovascular;  Laterality: N/A;  . ADENOIDECTOMY  age 30  . BIOPSY  05/31/2018   Procedure: BIOPSY;  Surgeon: Milus Banister, MD;  Location: [facility];  Service: Endoscopy;;  . CATARACT EXTRACTION W/ INTRAOCULAR LENS  IMPLANT, BILATERAL  2009  . COLONOSCOPY  2001, 05/02/2003, 01/28/11   2012: Right colon Crohn's, rectal polyp  . COLONOSCOPY  03/31/2012   Procedure: COLONOSCOPY;  Surgeon: Jerene Bears, MD;  Location: [facility];  Service: Gastroenterology;  Laterality: N/A;  . COLONOSCOPY WITH PROPOFOL N/A 05/31/2018   Procedure: COLONOSCOPY WITH PROPOFOL;  Surgeon: Milus Banister, MD;  Location: [facility] ENDOSCOPY;  Service: Endoscopy;  Laterality: N/A;  . ESOPHAGOGASTRODUODENOSCOPY  01/28/11   Normal  . FOOT SURGERY Right age 22  . MOHS SURGERY Left 11/2013   left ankle parakerotosis   . PERCUTANEOUS LIVER BIOPSY  2007 and 2008  . PILONIDAL CYST EXCISION  age 76  . PLACEMENT OF SETON N/A 11/06/2015   Procedure: PLACEMENT OF SETON;  Surgeon: [name], MD;  Location: [facility];  Service: General;  Laterality: N/A;  . UPPER GASTROINTESTINAL ENDOSCOPY     Social History   Social History Narrative   Single, history of substance abuse in recovery   Previously occupied on medical disability   1 child    Elderly parents involved and he helps them   1 brother in [place]   family history includes Allergies in his father; Breast cancer in his mother; Clotting disorder in his father; Heart disease in his father; Stomach cancer in his mother; Thyroid cancer in his father.   Review of Systems  See HPI

## 2018-10-03 NOTE — Assessment & Plan Note (Signed)
Work with psychiatry Try 2 mg clonazepam qhs

## 2018-10-03 NOTE — Assessment & Plan Note (Signed)
Seton in place Another area of drainage by report - better after cipro

## 2018-10-03 NOTE — Assessment & Plan Note (Signed)
Not on Tx Will f/u with him about this.

## 2018-10-03 NOTE — Assessment & Plan Note (Signed)
Lab Results  Component Value Date   WBC 15.1 (H) 08/26/2018   HGB 13.7 08/26/2018   HCT 43.4 08/26/2018   MCV 86.5 08/26/2018   PLT 205 08/26/2018   Lab Results  Component Value Date   FERRITIN 106 07/21/2018

## 2018-10-03 NOTE — Patient Instructions (Signed)
[name] I am glad things are somewhat better.  There is still a long way to go.  I reviewed what is in your protein shakes, and you are not getting enough vitamins.  I plan to contact you again about this to try to help you with that.  Please send me the name of the psychiatrist you will be seeing so I can communicate with them about your situation.  We will plan on a follow-up in 1 month, PJ will set that up.  I would consider trying taking 2 clonazepam at bedtime to see if that helps you sleep while you are waiting to see the psychiatrist.  Keep up the good work.  I appreciate the opportunity to care for you. [name], MD, [facility]

## 2018-10-03 NOTE — Assessment & Plan Note (Signed)
Psychiatric issues have taken priority Seems like perianal/rectal discharge better on cipro F/U me 1 month Stay on prednisone Work on getting back to [employer] as mental health improves and allows

## 2018-10-03 NOTE — Assessment & Plan Note (Signed)
Seems improved but not on therapy due to side effects w/ Abilify and Lexapro Hopefully psychiatry will be able to find something that works  Mirtazapine helped in past but when we retried it this year he claimed it made Crohn's worse and stopped it Sure would help sleep and appetitie

## 2018-10-03 NOTE — Assessment & Plan Note (Signed)
Not on supplementation Will follow-up with him about this

## 2018-10-03 NOTE — Assessment & Plan Note (Signed)
Getting 70% RDA in [employer] 2/day

## 2018-10-03 NOTE — Assessment & Plan Note (Signed)
This seems a bit better - but he needs to continue to work with psychiatry

## 2018-10-03 NOTE — Assessment & Plan Note (Signed)
Still using hydrocodone fore peri-rectal pain

## 2018-10-09 ENCOUNTER — Other Ambulatory Visit: Payer: Self-pay | Admitting: *Deleted

## 2018-10-09 ENCOUNTER — Encounter: Payer: Self-pay | Admitting: *Deleted

## 2018-10-09 NOTE — Patient Outreach (Signed)
[place] [facility]) Care Management  10/09/2018  [name] January 31, 1966 [id number]   CSW was able to make contact with patient today to follow-up regarding social work services and resources, as well as to assess patient's mental status.  Patient reported that he is "doing a lot better" and currently receiving medication management and psychotherapeutic services through [facility].  Patient had his initial visit with [facility] on Friday, September 29, 2018 and plans to follow-up with his psychiatrist and counselor at [facility] again on Thursday, October 12, 2018. Patient verbalized the importance of receiving psychiatric services, as well as communicating with his providers, with regards to any symptoms that he is experiencing, especially if suicidal thoughts and behaviors reappear.    Patient denies taking any antidepressant medications at present, for diagnoses of Major Depressive Disorder, recurrent, severe.  Patient reported that the Abilify and Lexapro, prescribed to him while at [facility] in [place], [place], made him feel dizzy and short-of-breath.  However, patient is receptive to trying a new antidepressant medication and is even willing to give Remeron another try, as it seemed to help with sleep, appetite and mood.  Patient indicated that he is now taking Clonazepam 2 MG PO Daily, at bedtime, to help with insomnia, prescribed to him by [name], Gastroenterologist managing patient's Crohn's Disease.  CSW will perform a case closure on patient, as all goals of treatment have been met from social work standpoint and no additional social work needs have been identified at this time.  Patient denied the need for continued social work involvement, now that he is established with psychiatric services.  Patient has been encouraged to contact CSW directly if social work needs and/or services arise in the near future, confirming with  CSW that he has the correct contact information.  Patient voiced understanding and was agreeable to this plan.  CSW will fax an update to patient's Primary Care Physician, Dr. Lona Kettle to ensure that they are aware of CSW's involvement with patient's plan of care.    Nat Christen, [name], [name], LCSW  Licensed Education officer, environmental Health System  Mailing [place] [address] [place], [place] [address] Physical Address-[address], [place], [place] [address] Toll Free Main # [phone] Fax # [phone] Cell # [phone]  Office # [phone] [email or web address]

## 2018-10-13 ENCOUNTER — Telehealth: Payer: Self-pay

## 2018-10-13 MED ORDER — PREDNISONE 20 MG PO TABS: 30.0000 mg | ORAL_TABLET | Freq: Every day | ORAL | 1 refills | Status: DC

## 2018-10-13 NOTE — Telephone Encounter (Signed)
Prednisone refilled

## 2018-10-13 NOTE — Telephone Encounter (Signed)
Refill give a 90 day supply and 1 refill

## 2018-10-13 NOTE — Telephone Encounter (Signed)
Pharmacy requesting refill on prednisone 52m tablets with a sig of 1.5 tablets a day. Please advise Sir, thank you.

## 2018-10-24 DIAGNOSIS — F3181 Bipolar II disorder: Secondary | ICD-10-CM | POA: Diagnosis not present

## 2018-10-26 ENCOUNTER — Encounter: Payer: Self-pay | Admitting: Internal Medicine

## 2018-10-26 ENCOUNTER — Other Ambulatory Visit: Payer: Self-pay | Admitting: Internal Medicine

## 2018-10-26 DIAGNOSIS — G894 Chronic pain syndrome: Secondary | ICD-10-CM

## 2018-10-26 MED ORDER — HYDROCODONE-ACETAMINOPHEN 5-325 MG PO TABS: 1.0000 | ORAL_TABLET | Freq: Four times a day (QID) | ORAL | 0 refills | Status: DC | PRN

## 2018-11-02 ENCOUNTER — Ambulatory Visit (INDEPENDENT_AMBULATORY_CARE_PROVIDER_SITE_OTHER): Payer: Medicare Other | Admitting: Internal Medicine

## 2018-11-02 ENCOUNTER — Other Ambulatory Visit: Payer: Self-pay

## 2018-11-02 ENCOUNTER — Other Ambulatory Visit (INDEPENDENT_AMBULATORY_CARE_PROVIDER_SITE_OTHER): Payer: Medicare Other

## 2018-11-02 ENCOUNTER — Encounter: Payer: Self-pay | Admitting: Internal Medicine

## 2018-11-02 DIAGNOSIS — F332 Major depressive disorder, recurrent severe without psychotic features: Secondary | ICD-10-CM | POA: Diagnosis not present

## 2018-11-02 DIAGNOSIS — E54 Ascorbic acid deficiency: Secondary | ICD-10-CM

## 2018-11-02 DIAGNOSIS — E509 Vitamin A deficiency, unspecified: Secondary | ICD-10-CM

## 2018-11-02 DIAGNOSIS — K604 Rectal fistula: Secondary | ICD-10-CM

## 2018-11-02 DIAGNOSIS — E531 Pyridoxine deficiency: Secondary | ICD-10-CM

## 2018-11-02 DIAGNOSIS — K754 Autoimmune hepatitis: Secondary | ICD-10-CM | POA: Diagnosis not present

## 2018-11-02 DIAGNOSIS — E560 Deficiency of vitamin E: Secondary | ICD-10-CM

## 2018-11-02 DIAGNOSIS — K50814 Crohn's disease of both small and large intestine with abscess: Secondary | ICD-10-CM | POA: Diagnosis not present

## 2018-11-02 LAB — COMPREHENSIVE METABOLIC PANEL
ALT: 43 U/L (ref 0–53)
AST: 21 U/L (ref 0–37)
Albumin: 3.3 g/dL — ABNORMAL LOW (ref 3.5–5.2)
Alkaline Phosphatase: 95 U/L (ref 39–117)
BUN: 22 mg/dL (ref 6–23)
CO2: 31 mEq/L (ref 19–32)
Calcium: 8.7 mg/dL (ref 8.4–10.5)
Chloride: 103 mEq/L (ref 96–112)
Creatinine, Ser: 0.97 mg/dL (ref 0.40–1.50)
GFR: 80.21 mL/min (ref >60.00)
Glucose, Bld: 97 mg/dL (ref 70–99)
Potassium: 4.6 mEq/L (ref 3.5–5.1)
Sodium: 139 mEq/L (ref 135–145)
Total Bilirubin: 0.3 mg/dL (ref 0.2–1.2)
Total Protein: 6 g/dL (ref 6.0–8.3)

## 2018-11-02 LAB — CBC
HCT: 34.5 % — ABNORMAL LOW (ref 39.0–52.0)
Hemoglobin: 10.8 g/dL — ABNORMAL LOW (ref 13.0–17.0)
MCHC: 31.4 g/dL (ref 30.0–36.0)
MCV: 84.4 fl (ref 78.0–100.0)
Platelets: 267 10*3/uL (ref 150.0–400.0)
RBC: 4.09 Mil/uL — ABNORMAL LOW (ref 4.22–5.81)
RDW: 16.9 % — ABNORMAL HIGH (ref 11.5–15.5)
WBC: 10.6 10*3/uL — ABNORMAL HIGH (ref 4.0–10.5)

## 2018-11-02 LAB — FERRITIN: Ferritin: 25.8 ng/mL (ref 22.0–322.0)

## 2018-11-02 MED ORDER — GABAPENTIN 600 MG PO TABS: 600.0000 mg | ORAL_TABLET | Freq: Every day | ORAL | 1 refills | Status: DC

## 2018-11-02 NOTE — Assessment & Plan Note (Signed)
Refer back to Dr. Marcello Moores

## 2018-11-02 NOTE — Patient Instructions (Addendum)
Your provider has requested that you go to the basement level for lab work before leaving today. Press "B" on the elevator. The lab is located at the first door on the left as you exit the elevator.  We have sent the following medications to your pharmacy for you to pick up at your convenience: Gabapentin  You have been scheduled for an appointment with Dr [name] at [facility]. Your appointment is on _______________ at __________________. Please arrive at ________________ for registration. Make certain to bring a list of current medications, including any over the counter medications or vitamins. Also bring your co-pay if you have one as well as your insurance cards. [facility] is located at [address], [address]. Should you need to reschedule your appointment, please contact them at [phone].   We are going to work on getting your biologic started back.   I appreciate the opportunity to care for you. Silvano Rusk, MD, [facility]

## 2018-11-02 NOTE — Assessment & Plan Note (Signed)
labs

## 2018-11-02 NOTE — Assessment & Plan Note (Signed)
recheck

## 2018-11-02 NOTE — Assessment & Plan Note (Signed)
Recheck.

## 2018-11-02 NOTE — Assessment & Plan Note (Signed)
Improved but needs to get back on entyvio

## 2018-11-02 NOTE — Assessment & Plan Note (Signed)
Better Refill gabapentin

## 2018-11-02 NOTE — Progress Notes (Signed)
[name] 53 y.o. 1965/12/29 [id number]  Assessment & Plan:  CROHN'S DISEASE, LARGE AND SMALL INTESTINES Improved but needs to get back on entyvio   Perirectal fistula and abscess Refer back to Dr. Marcello Moores  MDD (major depressive disorder), recurrent severe, without psychosis ([place]) Better Refill gabapentin  Autoimmune hepatitis-PSC overlap labs  Vitamin E deficiency recheck  Vitamin C deficiency recheck  Vitamin B6 deficiency Recheck   Vitamin A deficiency Recheck       Subjective:   Chief Complaint: Crohn's disease, perianal abscess wants seton out  HPI [name] is here for follow-up for the first time since June.  He was hospitalized at a psychiatric facility again, and this time was able to stay for a while.  He was treated with antidepressants and established outpatient care here in [place] but he is not taking his antidepressants because he said they cause side effects.  He is fortunately out and about some doing yard work gaining weight and eating again and sleeping some nights.  He feels like gabapentin at bedtime is helpful for him.  He is having some intermittent perianal drainage and seepage, no fevers is mildly tender and the seton is irritating him.  This was placed when he was hospitalized in [place].  He is taking some vitamins and not know exactly which because he did not bring them.  But he is said he is taking the vitamins he needs.  Wants to be rechecked.  He has been on some nutritional supplements as well as outlined in previous notes.  Some diarrhea not much of any bleeding.  No fevers reported.  Affect is brighter he says.  He is not suicidal.  Wt Readings from Last 3 Encounters:  11/02/18 156 lb (70.8 kg)  08/22/18 140 lb (63.5 kg)  07/31/18 140 lb (63.5 kg)     Allergies  Allergen Reactions  . Asacol [Mesalamine] Diarrhea    abd pain  . Flagyl [Metronidazole] Diarrhea    abd pain  . Amoxicillin Other (See Comments)   Lots of gas  . Azathioprine Diarrhea    abd pain  . Gluten Meal Diarrhea  . Mycophenolate Mofetil Diarrhea    abd pain  . Other     NUTS; constipation  . Wheat Bran Diarrhea    Constipation; flatulence; abd pain  . Tape Itching and Rash    Please use "paper" tape   Current Meds  Medication Sig  . clonazePAM (KLONOPIN) 1 MG tablet Take 1 mg by mouth 2 (two) times daily.  Marland Kitchen gabapentin (NEURONTIN) 600 MG tablet Take 600 mg by mouth at bedtime.  Marland Kitchen HYDROcodone-acetaminophen (NORCO/VICODIN) 5-325 MG tablet Take 1 tablet by mouth every 6 (six) hours as needed for moderate pain or severe pain.  . Melatonin 3 MG CAPS Take 3 mg by mouth at bedtime as needed.  . predniSONE (DELTASONE) 20 MG tablet Take 1.5 tablets (30 mg total) by mouth daily with breakfast.  . traZODone (DESYREL) 50 MG tablet Take 50 mg by mouth at bedtime as needed.   Past Medical History:  Diagnosis Date  . Allergy   . Anal fistula   . Anxiety   . 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 Autoimmune hepatitis ([place]) 01/19/2013  . Avoidant-restrictive food intake disorder (ARFID) ? 06/15/2018  . Cancer of skin of neck   . Cataract   . Chronic mesenteric ischemia ([place])   . Chronic pain syndrome 07/09/2016  . Crohn's disease of small and large intestines (  [place])    followed by dr Glendell Docker gessner  . Dairy product intolerance   . Diarrhea, functional   . Family history of adverse reaction to anesthesia   . Grover's disease    transient acantholytic dermatosis  . History of alcohol abuse   . History of basal cell carcinoma excision    2013 left leg  . History of Clostridium difficile    10/ 2014  . History of multiple concussions    x6   last one Jan 2017 per pt--  no residual  . History of substance abuse ([place])    quit 1997 per pt  . History of suicide attempt    05-18-2012  overdose /  failure to thrive  . Hypercholesterolemia   . Iron deficiency anemia due to chronic blood loss   . Major depression, recurrent,  chronic ([place])   . Osteopenia   . Personal history of adenomatous colonic polyps 12/2010, 03/2012   12/2010 - 8 mm serrated adenoma of rectum  . Portal vein thrombosis 03/21/2015   right  . Primary sclerosing cholangitis    ? hepatitis overlap - liver bx x 2 and MRCP  . Seasonal allergies   . Substance abuse ([place]) 1997   Alcohol  . Vitamin A deficiency 05/23/2018  . Vitamin B6 deficiency 07/27/2018  . Vitamin C deficiency 05/23/2018   Past Surgical History:  Procedure Laterality Date  . ABDOMINAL AORTAGRAM N/A 03/29/2012   Procedure: ABDOMINAL Maxcine Ham;  Surgeon: Serafina Mitchell, MD;  Location: [facility] CATH LAB;  Service: Cardiovascular;  Laterality: N/A;  . ADENOIDECTOMY  age 24  . BIOPSY  05/31/2018   Procedure: BIOPSY;  Surgeon: Milus Banister, MD;  Location: [facility]OS[facility];  Service: Endoscopy;;  . CATARACT EXTRACTION W/ INTRAOCULAR LENS  IMPLANT, BILATERAL  2009  . COLONOSCOPY  2001, 05/02/2003, 01/28/11   2012: Right colon Crohn's, rectal polyp  . COLONOSCOPY  03/31/2012   Procedure: COLONOSCOPY;  Surgeon: Jerene Bears, MD;  Location: [facility];  Service: Gastroenterology;  Laterality: N/A;  . COLONOSCOPY WITH PROPOFOL N/A 05/31/2018   Procedure: COLONOSCOPY WITH PROPOFOL;  Surgeon: Milus Banister, MD;  Location: [facility] ENDOSCOPY;  Service: Endoscopy;  Laterality: N/A;  . ESOPHAGOGASTRODUODENOSCOPY  01/28/11   Normal  . FOOT SURGERY Right age 79  . MOHS SURGERY Left 11/2013   left ankle parakerotosis   . PERCUTANEOUS LIVER BIOPSY  2007 and 2008  . PILONIDAL CYST EXCISION  age 49  . PLACEMENT OF SETON N/A 11/06/2015   Procedure: PLACEMENT OF SETON;  Surgeon: [name], MD;  Location: [place] [facility];  Service: General;  Laterality: N/A;  . UPPER GASTROINTESTINAL ENDOSCOPY     Social History   Social History Narrative   Single, history of substance abuse in recovery   Previously occupied on medical disability   1 child    Elderly parents involved and he helps them   1  brother in [place]   family history includes Allergies in his father; Breast cancer in his mother; Clotting disorder in his father; Heart disease in his father; Stomach cancer in his mother; Thyroid cancer in his father.   Review of Systems AS ABOVE Objective:   Physical Exam BP 108/72 (BP Location: Left Arm, Patient Position: Sitting, Cuff Size: Normal)   Pulse 84   Temp 98.1 F (36.7 C) (Oral)   Ht 6' 2"  (1.88 m)   Wt 156 lb (70.8 kg)   BMI 20.03 kg/m  Thin but better Anicteric  Lungs cta 'NL cor abd soft NT and quiet Rectal   Right post seton, R/L perianal ild erythema, some drainage for seton indurauon o/w Declined DRE   Skin - trunk no rash  Depression scale - PHQ-9 Score is 4  Alert and orineted x 3

## 2018-11-03 ENCOUNTER — Encounter: Payer: Self-pay | Admitting: Internal Medicine

## 2018-11-07 ENCOUNTER — Telehealth: Payer: Self-pay

## 2018-11-07 DIAGNOSIS — K50814 Crohn's disease of both small and large intestine with abscess: Secondary | ICD-10-CM

## 2018-11-07 MED ORDER — ENTYVIO 300 MG IV SOLR: INTRAVENOUS | 9 refills | Status: DC

## 2018-11-07 NOTE — Telephone Encounter (Signed)
Orders faxed to [facility] to resume Entyvio. I sent the patient a MyChart message letting him know referral has been sent and they should be contacting him for an appt.

## 2018-11-07 NOTE — Progress Notes (Signed)
[name],  Labs and vitamin levels looking better.  Your vitamin E is actually slightly high. How much of that are you taking?  Vitamin C is still pending.  Glad things are better.  Take care,  Dr. Darnell Level

## 2018-11-07 NOTE — Telephone Encounter (Signed)
-----   Message from [name], MD sent at 11/03/2018  4:59 PM EDT ----- Regarding: [name] He needs to get referred back to [facility] to restart Entyvio  We did his labs

## 2018-11-08 LAB — VITAMIN A: Vitamin A (Retinoic Acid): 44 ug/dL (ref 38–98)

## 2018-11-08 LAB — VITAMIN E
Gamma-Tocopherol (Vit E): <1 mg/L (ref <=4.3)
Vitamin E (Alpha Tocopherol): 20 mg/L — ABNORMAL HIGH (ref 5.7–19.9)

## 2018-11-08 LAB — QUANTIFERON-TB GOLD PLUS
Mitogen-NIL: 9.13 IU/mL
NIL: 0.02 IU/mL
QuantiFERON-TB Gold Plus: NEGATIVE
TB1-NIL: 0 IU/mL
TB2-NIL: 0 IU/mL

## 2018-11-08 LAB — VITAMIN C: Vitamin C: 0.1 mg/dL — ABNORMAL LOW (ref 0.2–2.1)

## 2018-11-08 LAB — EXTRA SPECIMEN

## 2018-11-08 LAB — HEPATITIS B SURFACE ANTIGEN: Hepatitis B Surface Ag: NONREACTIVE

## 2018-11-08 LAB — VITAMIN B6: Vitamin B6: 6.7 ng/mL (ref 2.1–21.7)

## 2018-11-10 ENCOUNTER — Other Ambulatory Visit: Payer: Self-pay

## 2018-11-10 DIAGNOSIS — D500 Iron deficiency anemia secondary to blood loss (chronic): Secondary | ICD-10-CM

## 2018-11-10 DIAGNOSIS — K50814 Crohn's disease of both small and large intestine with abscess: Secondary | ICD-10-CM

## 2018-11-10 MED ORDER — INJECTAFER 750 MG/15ML IV SOLN: 750.0000 mg | INTRAVENOUS | 1 refills | Status: AC

## 2018-11-10 NOTE — Progress Notes (Signed)
amb ref °

## 2018-11-10 NOTE — Addendum Note (Signed)
Addended by: Marlon Pel on: 11/10/2018 11:33 AM   Modules accepted: Orders

## 2018-11-14 DIAGNOSIS — F3181 Bipolar II disorder: Secondary | ICD-10-CM | POA: Diagnosis not present

## 2018-11-15 ENCOUNTER — Other Ambulatory Visit: Payer: Self-pay | Admitting: Internal Medicine

## 2018-11-15 MED ORDER — LEVOFLOXACIN 500 MG PO TABS: 500.0000 mg | ORAL_TABLET | Freq: Every day | ORAL | 0 refills | Status: AC

## 2018-11-15 NOTE — Progress Notes (Signed)
Vitamin C is up but still low Stay on it

## 2018-11-20 DIAGNOSIS — D500 Iron deficiency anemia secondary to blood loss (chronic): Secondary | ICD-10-CM | POA: Diagnosis not present

## 2018-11-21 DIAGNOSIS — K5090 Crohn's disease, unspecified, without complications: Secondary | ICD-10-CM | POA: Diagnosis not present

## 2018-11-22 DIAGNOSIS — Z23 Encounter for immunization: Secondary | ICD-10-CM | POA: Diagnosis not present

## 2018-11-27 DIAGNOSIS — D500 Iron deficiency anemia secondary to blood loss (chronic): Secondary | ICD-10-CM | POA: Diagnosis not present

## 2018-11-28 DIAGNOSIS — F332 Major depressive disorder, recurrent severe without psychotic features: Secondary | ICD-10-CM | POA: Diagnosis not present

## 2018-12-02 ENCOUNTER — Other Ambulatory Visit: Payer: Self-pay | Admitting: Internal Medicine

## 2018-12-02 MED ORDER — HYDROCODONE-ACETAMINOPHEN 5-325 MG PO TABS: 1.0000 | ORAL_TABLET | Freq: Four times a day (QID) | ORAL | 0 refills | Status: DC | PRN

## 2018-12-04 ENCOUNTER — Ambulatory Visit: Payer: Self-pay | Admitting: General Surgery

## 2018-12-04 DIAGNOSIS — K50113 Crohn's disease of large intestine with fistula: Secondary | ICD-10-CM | POA: Diagnosis not present

## 2018-12-04 NOTE — H&P (Signed)
History of Present Illness [name] Ruff MD; 38/03/8297 5:07 PM) The patient is a 53 year old male who presents with anal fistula. 53 year old male with Crohn's disease on Entyvio and prednisone who presented to the office for evaluation. He was hospitalized and probably approximately 3 months ago and a seton was placed. He was doing well up until a few weeks ago when he developed worsening drainage. He was seen by [name] and started on antibiotics. He is here today for further evaluation for possible surgery.   Problem List/Past Medical [name] Ruff, MD; 37/03/6965 5:07 PM) PERIANAL CROHN'S DISEASE, WITH FISTULA (K50.113)  Past Surgical History [name] Ruff, MD; 89/04/8099 5:07 PM) Cataract Surgery Bilateral. Colon Polyp Removal - Colonoscopy Foot Surgery Right.  Diagnostic Studies History [name] Ruff, MD; 75/03/256 5:07 PM) Colonoscopy within last year  Allergies Sallyanne Kuster, CMA; 12/04/2018 4:53 PM) Asacol *GASTROINTESTINAL AGENTS - MISC.* Diarrhea. azaTHIOprine Diarrhea. GLUTENIN Diarrhea. Mycophenolate Mofetil Diarrhea. WHEAT BRAN Diarrhea. Adhesive Tape Rash. Allergies Reconciled  Medication History Sallyanne Kuster, CMA; 12/04/2018 4:54 PM) traZODone HCl (50MG Tablet, Oral) Active. Triamcinolone Acetonide (0.1% Cream, External) Active. Vitamin A & D (25000-1000UNIT Tablet, Oral) Active. Ascorbic Acid CR (1000MG Tablet ER, Oral) Active. Calcium-Vitamin D (900MG Capsule, Oral) Active. ClonazePAM (0.5MG Tablet, Oral) Active. Fluticasone Propionate (50MCG/ACT Suspension, Nasal) Active. Humira Pen (40MG/0.8ML Pen-inj Kit, Subcutaneous) Active. Hydrocodone-Acetaminophen (5-325MG Tablet, Oral) Active. MetroNIDAZOLE (500MG Tablet, Oral) Active. PredniSONE (20MG Tablet, Oral) Active. Mirtazapine (30MG Tablet, Oral) Active. Medications Reconciled  Social History [name] Ruff, MD; 52/08/7822 5:07 PM) Alcohol use Remotely quit alcohol  use. Caffeine use Tea. Illicit drug use Remotely quit drug use. Tobacco use Former smoker.  Family History [name] Ruff, MD; 23/06/3612 5:07 PM) Arthritis Father, Mother. Breast Cancer Mother. Depression Brother, Father, Mother. Heart Disease Father. Heart disease in male family member before age 27 Hypertension Father. Ischemic Bowel Disease Mother. Thyroid problems Father.  Other Problems [name] Ruff, MD; 43/03/5398 5:07 PM) Alcohol Abuse Anxiety Disorder Arthritis Back Pain Cirrhosis Of Liver Crohn's Disease Depression Hemorrhoids Hepatitis Other disease, cancer, significant illness     Review of Systems [name] Ruff MD; 86/08/6193 5:07 PM) General Not Present- Appetite Loss, Chills, Fatigue, Fever, Night Sweats, Weight Gain and Weight Loss. Skin Present- Dryness. Not Present- Change in Wart/Mole, Hives, Jaundice, New Lesions, Non-Healing Wounds, Rash and Ulcer. HEENT Present- Hearing Loss, Hoarseness, Seasonal Allergies, Visual Disturbances and Wears glasses/contact lenses. Not Present- Earache, Nose Bleed, Oral Ulcers, Ringing in the Ears, Sinus Pain, Sore Throat and Yellow Eyes. Respiratory Present- Difficulty Breathing. Not Present- Bloody sputum, Chronic Cough, Snoring and Wheezing. Breast Not Present- Breast Mass, Breast Pain, Nipple Discharge and Skin Changes. Cardiovascular Present- Shortness of Breath and Swelling of Extremities. Not Present- Chest Pain, Difficulty Breathing Lying Down, Leg Cramps, Palpitations and Rapid Heart Rate. Gastrointestinal Present- Abdominal Pain, Bloating, Bloody Stool, Chronic diarrhea, Difficulty Swallowing, Excessive gas, Hemorrhoids, Indigestion, Nausea and Rectal Pain. Not Present- Change in Bowel Habits, Constipation, Gets full quickly at meals and Vomiting. Male Genitourinary Not Present- Blood in Urine, Change in Urinary Stream, Frequency, Impotence, Nocturia, Painful Urination, Urgency and Urine  Leakage. Musculoskeletal Present- Back Pain, Joint Pain, Joint Stiffness, Muscle Pain, Muscle Weakness and Swelling of Extremities. Neurological Present- Decreased Memory, Headaches and Tingling. Not Present- Fainting, Numbness, Seizures, Tremor, Trouble walking and Weakness. Psychiatric Present- Anxiety, Change in Sleep Pattern and Depression. Not Present- Bipolar, Fearful and Frequent crying. Endocrine Not Present- Cold Intolerance, Excessive Hunger, Hair Changes, Heat Intolerance and New Diabetes. Hematology Present- Easy Bruising. Not Present- Blood  Thinners, Excessive bleeding, Gland problems, HIV and Persistent Infections.  Vitals Sallyanne Kuster CMA; 12/04/2018 4:55 PM) 12/04/2018 4:54 PM Weight: 150.6 lb Height: 74in Body Surface Area: 1.93 m Body Mass Index: 19.34 kg/m  Temp.: 79F  Pulse: 75 (Regular)  BP: 105/70 (Sitting, Left Arm, Standard)        Physical Exam [name] Ruff MD; 39/05/3198 5:08 PM)  General Mental Status-Alert. General Appearance-Cooperative.  Abdomen Palpation/Percussion Palpation and Percussion of the abdomen reveal - Soft and Non Tender.  Rectal Anorectal Exam External - Note: Posterior midline external opening a few centimeters from anus as well as another further towards his sacrum. Right and left lateral areas draining purulence - when pressure applied. Seton in place and right posterior anal canal with stool draining from opening.    Assessment & Plan [name] Ruff MD; 37/10/4444 5:05 PM)  PERIANAL CROHN'S DISEASE, WITH FISTULA (K50.113) Impression: 53 year old male who presents to the office for evaluation of his perianal Crohn's disease. He was hospitalized approximately 3 months ago for perianal abscess in [place]. A seton was placed. On exam today, he has multiple punctate lesions draining purulence. This does not appear to be adequately drained, and I have recommended exam under anesthesia with possible incision of fistula  tracts or seton placement depending on the course of these fistula tracts. He is currently starting on Entyvio and I think he should probably have better control of his perianal infection in order to stay on this medication. I believe he understands this and wishes to proceed with surgery.

## 2018-12-06 DIAGNOSIS — K5090 Crohn's disease, unspecified, without complications: Secondary | ICD-10-CM | POA: Diagnosis not present

## 2018-12-12 DIAGNOSIS — F332 Major depressive disorder, recurrent severe without psychotic features: Secondary | ICD-10-CM | POA: Diagnosis not present

## 2018-12-26 DIAGNOSIS — F332 Major depressive disorder, recurrent severe without psychotic features: Secondary | ICD-10-CM | POA: Diagnosis not present

## 2018-12-26 DIAGNOSIS — F3181 Bipolar II disorder: Secondary | ICD-10-CM | POA: Diagnosis not present

## 2019-01-03 DIAGNOSIS — K5090 Crohn's disease, unspecified, without complications: Secondary | ICD-10-CM | POA: Diagnosis not present

## 2019-01-05 ENCOUNTER — Other Ambulatory Visit: Payer: Self-pay | Admitting: Internal Medicine

## 2019-01-05 DIAGNOSIS — G894 Chronic pain syndrome: Secondary | ICD-10-CM

## 2019-01-05 MED ORDER — HYDROCODONE-ACETAMINOPHEN 5-325 MG PO TABS: 1.0000 | ORAL_TABLET | Freq: Four times a day (QID) | ORAL | 0 refills | Status: DC | PRN

## 2019-01-09 DIAGNOSIS — F332 Major depressive disorder, recurrent severe without psychotic features: Secondary | ICD-10-CM | POA: Diagnosis not present

## 2019-01-15 ENCOUNTER — Other Ambulatory Visit (HOSPITAL_COMMUNITY)
Admission: RE | Admit: 2019-01-15 | Discharge: 2019-01-15 | Disposition: A | Discharge status: Home / Self Care | Payer: Medicare Other | Source: Ambulatory Visit | Attending: General Surgery | Admitting: General Surgery

## 2019-01-15 DIAGNOSIS — Z01812 Encounter for preprocedural laboratory examination: Secondary | ICD-10-CM | POA: Insufficient documentation

## 2019-01-15 DIAGNOSIS — Z20828 Contact with and (suspected) exposure to other viral communicable diseases: Secondary | ICD-10-CM | POA: Insufficient documentation

## 2019-01-17 ENCOUNTER — Other Ambulatory Visit: Payer: Self-pay

## 2019-01-17 ENCOUNTER — Encounter (HOSPITAL_BASED_OUTPATIENT_CLINIC_OR_DEPARTMENT_OTHER): Payer: Self-pay | Admitting: *Deleted

## 2019-01-17 LAB — NOVEL CORONAVIRUS, NAA (HOSP ORDER, SEND-OUT TO REF LAB; TAT 18-24 HRS): SARS-CoV-2, NAA: NOT DETECTED

## 2019-01-17 NOTE — Progress Notes (Addendum)
Spoke w/ via phone for pre-op interview---[facility] needs dos----    cmet          Lab results------chest xray 07-31-18 epic, ekg 08-26-18 epic COVID test ------01-15-2019 Arrive at -------830 NPO after ------midnight Medications to take morning of surgery -----hydrocodone prn, clonazepam, cannot take prednisone with out food per patient Diabetic medication ----n/a Patient Special Instructions ----- Pre-Op special Istructions ----- Patient verbalized understanding of instructions that were given at this phone interview. Patient denies shortness of breath, chest pain, fever, cough a this phone interview.

## 2019-01-18 ENCOUNTER — Encounter (HOSPITAL_BASED_OUTPATIENT_CLINIC_OR_DEPARTMENT_OTHER): Payer: Self-pay | Admitting: *Deleted

## 2019-01-18 ENCOUNTER — Ambulatory Visit (HOSPITAL_BASED_OUTPATIENT_CLINIC_OR_DEPARTMENT_OTHER): Payer: Medicare Other | Admitting: Certified Registered"

## 2019-01-18 ENCOUNTER — Encounter (HOSPITAL_BASED_OUTPATIENT_CLINIC_OR_DEPARTMENT_OTHER)
Admission: RE | Disposition: A | Discharge status: Home / Self Care | Payer: Self-pay | Source: Home / Self Care | Attending: General Surgery

## 2019-01-18 ENCOUNTER — Ambulatory Visit (HOSPITAL_BASED_OUTPATIENT_CLINIC_OR_DEPARTMENT_OTHER)
Admission: RE | Admit: 2019-01-18 | Discharge: 2019-01-18 | Disposition: A | Discharge status: Home / Self Care | Payer: Medicare Other | Attending: General Surgery | Admitting: General Surgery

## 2019-01-18 ENCOUNTER — Other Ambulatory Visit: Payer: Self-pay

## 2019-01-18 DIAGNOSIS — K5090 Crohn's disease, unspecified, without complications: Secondary | ICD-10-CM | POA: Insufficient documentation

## 2019-01-18 DIAGNOSIS — F329 Major depressive disorder, single episode, unspecified: Secondary | ICD-10-CM | POA: Insufficient documentation

## 2019-01-18 DIAGNOSIS — G4700 Insomnia, unspecified: Secondary | ICD-10-CM | POA: Diagnosis not present

## 2019-01-18 DIAGNOSIS — F419 Anxiety disorder, unspecified: Secondary | ICD-10-CM | POA: Diagnosis not present

## 2019-01-18 DIAGNOSIS — Z7952 Long term (current) use of systemic steroids: Secondary | ICD-10-CM | POA: Diagnosis not present

## 2019-01-18 DIAGNOSIS — Z79899 Other long term (current) drug therapy: Secondary | ICD-10-CM | POA: Diagnosis not present

## 2019-01-18 DIAGNOSIS — M1990 Unspecified osteoarthritis, unspecified site: Secondary | ICD-10-CM | POA: Diagnosis not present

## 2019-01-18 DIAGNOSIS — Z87891 Personal history of nicotine dependence: Secondary | ICD-10-CM | POA: Insufficient documentation

## 2019-01-18 DIAGNOSIS — K7460 Unspecified cirrhosis of liver: Secondary | ICD-10-CM | POA: Insufficient documentation

## 2019-01-18 DIAGNOSIS — I10 Essential (primary) hypertension: Secondary | ICD-10-CM | POA: Insufficient documentation

## 2019-01-18 DIAGNOSIS — E7849 Other hyperlipidemia: Secondary | ICD-10-CM | POA: Diagnosis not present

## 2019-01-18 DIAGNOSIS — K603 Anal fistula: Secondary | ICD-10-CM | POA: Insufficient documentation

## 2019-01-18 DIAGNOSIS — K754 Autoimmune hepatitis: Secondary | ICD-10-CM | POA: Diagnosis not present

## 2019-01-18 HISTORY — PX: RECTAL EXAM UNDER ANESTHESIA: SHX6399

## 2019-01-18 LAB — COMPREHENSIVE METABOLIC PANEL
ALT: 163 U/L — ABNORMAL HIGH (ref 0–44)
AST: 62 U/L — ABNORMAL HIGH (ref 15–41)
Albumin: 3.4 g/dL — ABNORMAL LOW (ref 3.5–5.0)
Alkaline Phosphatase: 211 U/L — ABNORMAL HIGH (ref 38–126)
Anion gap: 11 (ref 5–15)
BUN: 22 mg/dL — ABNORMAL HIGH (ref 6–20)
CO2: 23 mmol/L (ref 22–32)
Calcium: 8.8 mg/dL — ABNORMAL LOW (ref 8.9–10.3)
Chloride: 105 mmol/L (ref 98–111)
Creatinine, Ser: 0.84 mg/dL (ref 0.61–1.24)
GFR calc Af Amer: >60 mL/min (ref >60)
GFR calc non Af Amer: >60 mL/min (ref >60)
Glucose, Bld: 76 mg/dL (ref 70–99)
Potassium: 3.9 mmol/L (ref 3.5–5.1)
Sodium: 139 mmol/L (ref 135–145)
Total Bilirubin: 0.3 mg/dL (ref 0.3–1.2)
Total Protein: 6.4 g/dL — ABNORMAL LOW (ref 6.5–8.1)

## 2019-01-18 SURGERY — EXAM UNDER ANESTHESIA, RECTUM
Anesthesia: Monitor Anesthesia Care | Site: Rectum

## 2019-01-18 MED ORDER — LACTATED RINGERS IV SOLN
INTRAVENOUS | Status: DC
  Administered 2019-01-18: 09:00:00 via INTRAVENOUS
  Filled 2019-01-18: qty 1000

## 2019-01-18 MED ORDER — BUPIVACAINE LIPOSOME 1.3 % IJ SUSP
20.0000 mL | Freq: Once | INTRAMUSCULAR | Status: DC
  Filled 2019-01-18: qty 20

## 2019-01-18 MED ORDER — OXYCODONE HCL 5 MG/5ML PO SOLN
5.0000 mg | Freq: Once | ORAL | Status: DC | PRN
  Filled 2019-01-18: qty 5

## 2019-01-18 MED ORDER — OXYCODONE HCL 5 MG PO TABS
5.0000 mg | ORAL_TABLET | Freq: Once | ORAL | Status: DC | PRN
  Filled 2019-01-18: qty 1

## 2019-01-18 MED ORDER — ACETAMINOPHEN 500 MG PO TABS
1000.0000 mg | ORAL_TABLET | ORAL | Status: DC
  Filled 2019-01-18: qty 2

## 2019-01-18 MED ORDER — SODIUM CHLORIDE 0.9% FLUSH
3.0000 mL | Freq: Two times a day (BID) | INTRAVENOUS | Status: DC
  Filled 2019-01-18: qty 3

## 2019-01-18 MED ORDER — PROPOFOL 500 MG/50ML IV EMUL
INTRAVENOUS | Status: AC
  Filled 2019-01-18: qty 50

## 2019-01-18 MED ORDER — FENTANYL CITRATE (PF) 100 MCG/2ML IJ SOLN
25.0000 ug | INTRAMUSCULAR | Status: DC | PRN
  Administered 2019-01-18: 25 ug via INTRAVENOUS
  Filled 2019-01-18: qty 1

## 2019-01-18 MED ORDER — PROMETHAZINE HCL 25 MG/ML IJ SOLN
6.2500 mg | INTRAMUSCULAR | Status: DC | PRN
  Filled 2019-01-18: qty 1

## 2019-01-18 MED ORDER — ONDANSETRON HCL 4 MG/2ML IJ SOLN
INTRAMUSCULAR | Status: AC
  Filled 2019-01-18: qty 2

## 2019-01-18 MED ORDER — PROPOFOL 500 MG/50ML IV EMUL
INTRAVENOUS | Status: DC | PRN
  Administered 2019-01-18: 200 ug/kg/min via INTRAVENOUS

## 2019-01-18 MED ORDER — FENTANYL CITRATE (PF) 100 MCG/2ML IJ SOLN
INTRAMUSCULAR | Status: AC
  Filled 2019-01-18: qty 2

## 2019-01-18 MED ORDER — ONDANSETRON HCL 4 MG/2ML IJ SOLN
INTRAMUSCULAR | Status: DC | PRN
  Administered 2019-01-18: 4 mg via INTRAVENOUS

## 2019-01-18 MED ORDER — PROPOFOL 10 MG/ML IV BOLUS
INTRAVENOUS | Status: DC | PRN
  Administered 2019-01-18: 50 mg via INTRAVENOUS

## 2019-01-18 MED ORDER — HYDROCODONE-ACETAMINOPHEN 5-325 MG PO TABS: 1.0000 | ORAL_TABLET | ORAL | 0 refills | Status: DC | PRN

## 2019-01-18 MED ORDER — BUPIVACAINE-EPINEPHRINE 0.5% -1:200000 IJ SOLN
INTRAMUSCULAR | Status: DC | PRN
  Administered 2019-01-18: 30 mL

## 2019-01-18 SURGICAL SUPPLY — 51 items
BENZOIN TINCTURE PRP APPL 2/3 (GAUZE/BANDAGES/DRESSINGS) ×4 IMPLANT
BLADE EXTENDED COATED 6.5IN (ELECTRODE) IMPLANT
BLADE HEX COATED 2.75 (ELECTRODE) ×2 IMPLANT
BLADE SURG 10 STRL SS (BLADE) IMPLANT
BRIEF STRETCH FOR OB PAD LRG (UNDERPADS AND DIAPERS) ×2 IMPLANT
CANISTER SUCT 3000ML PPV (MISCELLANEOUS) ×2 IMPLANT
COVER BACK TABLE 60X90IN (DRAPES) ×2 IMPLANT
COVER MAYO STAND STRL (DRAPES) ×2 IMPLANT
COVER WAND RF STERILE (DRAPES) ×2 IMPLANT
DECANTER SPIKE VIAL GLASS SM (MISCELLANEOUS) ×2 IMPLANT
DRAPE LAPAROTOMY 100X72 PEDS (DRAPES) ×2 IMPLANT
DRAPE UTILITY XL STRL (DRAPES) ×2 IMPLANT
DRSG PAD ABDOMINAL 8X10 ST (GAUZE/BANDAGES/DRESSINGS) ×2 IMPLANT
ELECT REM PT RETURN 9FT ADLT (ELECTROSURGICAL) ×2
ELECTRODE REM PT RTRN 9FT ADLT (ELECTROSURGICAL) ×1 IMPLANT
GAUZE SPONGE 4X4 12PLY STRL (GAUZE/BANDAGES/DRESSINGS) ×2 IMPLANT
GLOVE BIO SURGEON STRL SZ 6.5 (GLOVE) ×2 IMPLANT
GLOVE BIO SURGEON STRL SZ7 (GLOVE) ×2 IMPLANT
GLOVE BIOGEL PI IND STRL 7.0 (GLOVE) ×2 IMPLANT
GLOVE BIOGEL PI INDICATOR 7.0 (GLOVE) ×2
GOWN STRL REUS W/TWL LRG LVL3 (GOWN DISPOSABLE) ×2 IMPLANT
GOWN STRL REUS W/TWL XL LVL3 (GOWN DISPOSABLE) ×2 IMPLANT
HYDROGEN PEROXIDE 16OZ (MISCELLANEOUS) ×2 IMPLANT
IV CATH 14GX2 1/4 (CATHETERS) ×2 IMPLANT
IV CATH 18G SAFETY (IV SOLUTION) ×2 IMPLANT
KIT SIGMOIDOSCOPE (SET/KITS/TRAYS/PACK) IMPLANT
KIT TURNOVER CYSTO (KITS) ×2 IMPLANT
LOOP VESSEL MAXI BLUE (MISCELLANEOUS) ×2 IMPLANT
NEEDLE HYPO 22GX1.5 SAFETY (NEEDLE) ×2 IMPLANT
NS IRRIG 500ML POUR BTL (IV SOLUTION) ×2 IMPLANT
PACK BASIN DAY SURGERY FS (CUSTOM PROCEDURE TRAY) ×2 IMPLANT
PAD ABD 8X10 STRL (GAUZE/BANDAGES/DRESSINGS) ×2 IMPLANT
PAD ARMBOARD 7.5X6 YLW CONV (MISCELLANEOUS) IMPLANT
PENCIL BUTTON HOLSTER BLD 10FT (ELECTRODE) ×2 IMPLANT
SPONGE HEMORRHOID 8X3CM (HEMOSTASIS) IMPLANT
SPONGE SURGIFOAM ABS GEL 12-7 (HEMOSTASIS) IMPLANT
SUCTION FRAZIER HANDLE 10FR (MISCELLANEOUS)
SUCTION TUBE FRAZIER 10FR DISP (MISCELLANEOUS) IMPLANT
SUT CHROMIC 2 0 SH (SUTURE) IMPLANT
SUT CHROMIC 3 0 SH 27 (SUTURE) IMPLANT
SUT ETHIBOND 0 (SUTURE) ×4 IMPLANT
SUT VIC AB 2-0 SH 27 (SUTURE)
SUT VIC AB 2-0 SH 27XBRD (SUTURE) IMPLANT
SUT VIC AB 3-0 SH 18 (SUTURE) IMPLANT
SUT VIC AB 3-0 SH 27 (SUTURE)
SUT VIC AB 3-0 SH 27XBRD (SUTURE) IMPLANT
SYR CONTROL 10ML LL (SYRINGE) ×2 IMPLANT
TOWEL OR 17X26 10 PK STRL BLUE (TOWEL DISPOSABLE) ×2 IMPLANT
TRAY DSU PREP LF (CUSTOM PROCEDURE TRAY) ×2 IMPLANT
TUBE CONNECTING 12X1/4 (SUCTIONS) ×2 IMPLANT
YANKAUER SUCT BULB TIP NO VENT (SUCTIONS) ×2 IMPLANT

## 2019-01-18 NOTE — Anesthesia Preprocedure Evaluation (Addendum)
Anesthesia Evaluation  Patient identified by MRN, date of birth, ID band  Reviewed: Allergy & Precautions, NPO status , Patient's Chart, lab work & pertinent test results  History of Anesthesia Complications Negative for: history of anesthetic complications  Airway Mallampati: II  TM Distance: >3 FB Neck ROM: Full    Dental no notable dental hx.    Pulmonary former smoker,    Pulmonary exam normal        Cardiovascular hypertension, Normal cardiovascular exam     Neuro/Psych Anxiety Depression negative neurological ROS     GI/Hepatic (+) Hepatitis -, AutoimmunePSC Crohn's dx   Endo/Other  negative endocrine ROS  Renal/GU negative Renal ROS  negative genitourinary   Musculoskeletal  (+) Arthritis ,   Abdominal   Peds  Hematology negative hematology ROS (+)   Anesthesia Other Findings Day of surgery medications reviewed with patient.  Reproductive/Obstetrics negative OB ROS                            Anesthesia Physical Anesthesia Plan  ASA: II  Anesthesia Plan: MAC   Post-op Pain Management:    Induction:   PONV Risk Score and Plan: Treatment may vary due to age or medical condition and Propofol infusion  Airway Management Planned: Natural Airway and Nasal Cannula  Additional Equipment: None  Intra-op Plan:   Post-operative Plan:   Informed Consent: I have reviewed the patients History and Physical, chart, labs and discussed the procedure including the risks, benefits and alternatives for the proposed anesthesia with the patient or authorized representative who has indicated his/her understanding and acceptance.     Dental advisory given  Plan Discussed with: CRNA  Anesthesia Plan Comments:         Anesthesia Quick Evaluation

## 2019-01-18 NOTE — Anesthesia Postprocedure Evaluation (Signed)
Anesthesia Post Note  Patient: [name]  Procedure(s) Performed: ANAL EXAM UNDER ANESTHESIA,  INCISION AND DRAINAGE, SETON PLACEMENT (N/A Rectum)     Patient location during evaluation: PACU Anesthesia Type: MAC Level of consciousness: awake and alert and oriented Pain management: pain level controlled Vital Signs Assessment: post-procedure vital signs reviewed and stable Respiratory status: spontaneous breathing, nonlabored ventilation and respiratory function stable Cardiovascular status: blood pressure returned to baseline Postop Assessment: no apparent nausea or vomiting Anesthetic complications: no    Last Vitals:  Vitals:   01/18/19 1115 01/18/19 1130  BP: 121/73 126/86  Pulse: 69 65  Resp: 17 17  Temp:    SpO2: 100% 100%    Last Pain:  Vitals:   01/18/19 1130  TempSrc:   PainSc: [place]

## 2019-01-18 NOTE — Discharge Instructions (Addendum)
Beginning the day after surgery:  Please sit in a tub of warm water 2-3 times a day to relieve discomfort and encourage drainage of infection.  Eat a regular diet high in fiber.  Avoid foods that give you constipation or diarrhea.  Avoid foods that are difficult to digest, such as seeds, nuts, corn or popcorn.  Do not go any longer than 2 days without a bowel movement.  You may take a dose of Milk of Magnesia if you become constipated.    Drink 6-8 glasses of water daily.  Walking is encouraged.  Avoid strenuous activity and heavy lifting for 48 hrs after surgery.    Call the office if you have any questions or concerns.  Call immediately if you develop:    Excessive rectal bleeding (more than a cup or passing large clots)  Increased discomfort  Fever greater than 100 F  Difficulty urinating   Post Anesthesia Home Care Instructions  Activity: Get plenty of rest for the remainder of the day. A responsible individual must stay with you for 24 hours following the procedure.  For the next 24 hours, DO NOT: -Drive a car -Paediatric nurse -Drink alcoholic beverages -Take any medication unless instructed by your physician -Make any legal decisions or sign important papers.  Meals: Start with liquid foods such as gelatin or soup. Progress to regular foods as tolerated. Avoid greasy, spicy, heavy foods. If nausea and/or vomiting occur, drink only clear liquids until the nausea and/or vomiting subsides. Call your physician if vomiting continues.  Special Instructions/Symptoms: Your throat may feel dry or sore from the anesthesia or the breathing tube placed in your throat during surgery. If this causes discomfort, gargle with warm salt water. The discomfort should disappear within 24 hours.

## 2019-01-18 NOTE — H&P (Signed)
The patient is a 53 year old male who presents with anal fistula. 53 year old male with Crohn's disease on Entyvio and prednisone who presented to the office for evaluation. He was hospitalized and probably approximately 3 months ago and a seton was placed. He was doing well up until a few weeks ago when he developed worsening drainage. He was seen by [name] and started on antibiotics. He is here today for further evaluation for possible surgery.   Problem List/Past Medical [name], MD; 78/05/1280 5:07 PM) PERIANAL CROHN'S DISEASE, WITH FISTULA (K50.113)  Past Surgical History [name], MD; 09/29/3885 5:07 PM) Cataract Surgery Bilateral. Colon Polyp Removal - Colonoscopy Foot Surgery Right.  Diagnostic Studies History [name], MD; 19/06/9745 5:07 PM) Colonoscopy within last year  Allergies Sallyanne Kuster, CMA; 12/04/2018 4:53 PM) Asacol *GASTROINTESTINAL AGENTS - MISC.* Diarrhea. azaTHIOprine Diarrhea. GLUTENIN Diarrhea. Mycophenolate Mofetil Diarrhea. WHEAT BRAN Diarrhea. Adhesive Tape Rash. Allergies Reconciled  Medication History traZODone HCl (50MG Tablet, Oral) Active. Triamcinolone Acetonide (0.1% Cream, External) Active. Vitamin A & D (25000-1000UNIT Tablet, Oral) Active. Ascorbic Acid CR (1000MG Tablet ER, Oral) Active. Calcium-Vitamin D (900MG Capsule, Oral) Active. ClonazePAM (0.5MG Tablet, Oral) Active. Fluticasone Propionate (50MCG/ACT Suspension, Nasal) Active. Humira Pen (40MG/0.8ML Pen-inj Kit, Subcutaneous) Active. Hydrocodone-Acetaminophen (5-325MG Tablet, Oral) Active. MetroNIDAZOLE (500MG Tablet, Oral) Active. PredniSONE (20MG Tablet, Oral) Active. Mirtazapine (30MG Tablet, Oral) Active. Medications Reconciled  Social History [name], MD; 18/06/5013 5:07 PM) Alcohol use Remotely quit alcohol use. Caffeine use Tea. Illicit drug use Remotely quit drug use. Tobacco use Former  smoker.  Family History [name], MD; 86/09/2572 5:07 PM) Arthritis Father, Mother. Breast Cancer Mother. Depression Brother, Father, Mother. Heart Disease Father. Heart disease in male family member before age 33 Hypertension Father. Ischemic Bowel Disease Mother. Thyroid problems Father.  Other Problems ( Alcohol Abuse Anxiety Disorder Arthritis Back Pain Cirrhosis Of Liver Crohn's Disease Depression Hemorrhoids Hepatitis Other disease, cancer, significant illness     Review of Systems General Not Present- Appetite Loss, Chills, Fatigue, Fever, Night Sweats, Weight Gain and Weight Loss. Skin Present- Dryness. Not Present- Change in Wart/Mole, Hives, Jaundice, New Lesions, Non-Healing Wounds, Rash and Ulcer. HEENT Present- Hearing Loss, Hoarseness, Seasonal Allergies, Visual Disturbances and Wears glasses/contact lenses. Not Present- Earache, Nose Bleed, Oral Ulcers, Ringing in the Ears, Sinus Pain, Sore Throat and Yellow Eyes. Respiratory Present- Difficulty Breathing. Not Present- Bloody sputum, Chronic Cough, Snoring and Wheezing. Breast Not Present- Breast Mass, Breast Pain, Nipple Discharge and Skin Changes. Cardiovascular Present- Shortness of Breath and Swelling of Extremities. Not Present- Chest Pain, Difficulty Breathing Lying Down, Leg Cramps, Palpitations and Rapid Heart Rate. Gastrointestinal Present- Abdominal Pain, Bloating, Bloody Stool, Chronic diarrhea, Difficulty Swallowing, Excessive gas, Hemorrhoids, Indigestion, Nausea and Rectal Pain. Not Present- Change in Bowel Habits, Constipation, Gets full quickly at meals and Vomiting. Male Genitourinary Not Present- Blood in Urine, Change in Urinary Stream, Frequency, Impotence, Nocturia, Painful Urination, Urgency and Urine Leakage. Musculoskeletal Present- Back Pain, Joint Pain, Joint Stiffness, Muscle Pain, Muscle Weakness and Swelling of Extremities. Neurological Present- Decreased  Memory, Headaches and Tingling. Not Present- Fainting, Numbness, Seizures, Tremor, Trouble walking and Weakness. Psychiatric Present- Anxiety, Change in Sleep Pattern and Depression. Not Present- Bipolar, Fearful and Frequent crying. Endocrine Not Present- Cold Intolerance, Excessive Hunger, Hair Changes, Heat Intolerance and New Diabetes. Hematology Present- Easy Bruising. Not Present- Blood Thinners, Excessive bleeding, Gland problems, HIV and Persistent Infections.  Pulse (!) 107   Temp 97.9 F (36.6 C) (Oral)   Resp 14  Ht 6' 2"  (1.88 m)   Wt 64.9 kg   BMI 18.36 kg/m       Physical Exam  General Mental Status-Alert. General Appearance-Cooperative. Chest: CTA CV: RRR Abdomen Palpation/Percussion Palpation and Percussion of the abdomen reveal - Soft and Non Tender.  Rectal Anorectal Exam External - Note: Posterior midline external opening a few centimeters from anus as well as another further towards his sacrum. Right and left lateral areas draining purulence - when pressure applied. Seton in place and right posterior anal canal with stool draining from opening.    Assessment & Plan [name] MD; 95/04/9670 5:05 PM)  PERIANAL CROHN'S DISEASE, WITH FISTULA (K50.113) Impression: 53 year old male who presents to the office for evaluation of his perianal Crohn's disease. He was hospitalized approximately 3 months ago for perianal abscess in [place]. A seton was placed. On exam today, he has multiple punctate lesions draining purulence. This does not appear to be adequately drained, and I have recommended exam under anesthesia with possible incision of fistula tracts or seton placement depending on the course of these fistula tracts. He is currently starting on Entyvio and I think he should probably have better control of his perianal infection in order to stay on this medication. I believe he understands this and wishes to proceed with surgery.

## 2019-01-18 NOTE — Op Note (Signed)
01/18/2019  10:40 AM  PATIENT:  [name]  53 y.o. male  Patient Care Team: [name], MD as PCP - General (Family Medicine) [name], MD as Attending Physician (Gastroenterology) Elam Dutch, MD as Attending Physician (Vascular Surgery)  PRE-OPERATIVE DIAGNOSIS:  ANAL FISTULA, CROHNS  POST-OPERATIVE DIAGNOSIS:  ANAL FISTULA, CROHNS  PROCEDURE:  ANAL EXAM UNDER ANESTHESIA, INCISION AND DRAINAGE, SETON PLACEMENT   Surgeon(s): [name], MD  ASSISTANT: none   ANESTHESIA:   local and MAC  SPECIMEN:  No Specimen  DISPOSITION OF SPECIMEN:  N/A  COUNTS:  YES  PLAN OF CARE: Discharge to home after PACU  PATIENT DISPOSITION:  PACU - hemodynamically stable.  INDICATION: 53 year old male with severe Crohn's disease who presents to the office with continued perianal inflammation and drainage.  He had one seton placed at an outside hospital.  This is not adequately controlling his perineal sepsis.  I recommended exam under anesthesia with drainage procedure.   OR FINDINGS: Posterior perianal fistula tracking into the right anterior lateral ischio rectal space and down into right posterior lateral space.  Fistula also tracks to left of midline to the left posterior lateral space.  Setons were placed in the right and the left.  The right side was opened partially to allow for better drainage.  DESCRIPTION: the patient was identified in the preoperative holding area and taken to the OR where they were laid on the operating room table.  MAC anesthesia was induced without difficulty. The patient was then positioned in prone jackknife position with buttocks gently taped apart.  The patient was then prepped and draped in usual sterile fashion.  SCDs were noted to be in place prior to the initiation of anesthesia. A surgical timeout was performed indicating the correct patient, procedure, positioning and need for preoperative antibiotics.  A rectal block was  performed using Marcaine with epinephrine.    I began with a digital rectal exam.  There was no anal stricture.  A seton was noted at posterior midline with a wide anal fistula involving most of the external sphincter complex at posterior midline.  I then placed a Hill-Ferguson anoscope into the anal canal and evaluated this completely.  The distal rectum appeared noninflamed.  There were draining sinuses noted at left posterior lateral, posterior midline, right posterior lateral.  I used an S shaped fistula probe to identify the entire cavity.  Most of the cavity was positioned in the right posterior lateral ischio rectal space.  It tracked down to the anterior lateral space as well.  The posterior midline abscess also tracked to the left posterior lateral ischiorectal space.  I made a incision over the worst part of the fluctuance and open this to approximately 1 cm.  This will allow drainage of the right side.  I then placed a seton through this incision to the posterior midline draining sinus using an Ethibond suture and a vessel loop to create the seton.  I then placed another seton from posterior midline to the left posterior lateral draining sinus.  The remaining posterior midline seton was left in place.  A dressing was applied.  The patient was then awakened from anesthesia and sent to the postanesthesia care unit in stable condition.  All counts were correct per operating room staff.

## 2019-01-18 NOTE — Transfer of Care (Signed)
Immediate Anesthesia Transfer of Care Note  Patient: [name]  Procedure(s) Performed: Procedure(s) (LRB): ANAL EXAM UNDER ANESTHESIA,  INCISION AND DRAINAGE, SETON PLACEMENT (N/A)  Patient Location: PACU  Anesthesia Type: MAC  Level of Consciousness: awake, alert , oriented and patient cooperative  Airway & Oxygen Therapy: Patient Spontanous Breathing and Patient connected to face mask oxygen  Post-op Assessment: Report given to PACU RN and Post -op Vital signs reviewed and stable  Post vital signs: Reviewed and stable  Complications: No apparent anesthesia complications Last Vitals:  Vitals Value Taken Time  BP    Temp    Pulse 61 01/18/19 1049  Resp 26 01/18/19 1049  SpO2 100 % 01/18/19 1049  Vitals shown include unvalidated device data.  Last Pain:  Vitals:   01/18/19 0844  TempSrc: Oral  PainSc: 5       Patients Stated Pain Goal: 3 (01/18/19 0844)

## 2019-01-18 NOTE — Progress Notes (Signed)
Patient stated that tylenol upsets his stomach

## 2019-01-19 ENCOUNTER — Other Ambulatory Visit: Payer: Self-pay | Admitting: Internal Medicine

## 2019-01-19 ENCOUNTER — Encounter (HOSPITAL_BASED_OUTPATIENT_CLINIC_OR_DEPARTMENT_OTHER): Payer: Self-pay | Admitting: General Surgery

## 2019-01-19 NOTE — Telephone Encounter (Signed)
Please advise Sir, thank you. 

## 2019-01-23 DIAGNOSIS — F332 Major depressive disorder, recurrent severe without psychotic features: Secondary | ICD-10-CM | POA: Diagnosis not present

## 2019-02-06 DIAGNOSIS — F3181 Bipolar II disorder: Secondary | ICD-10-CM | POA: Diagnosis not present

## 2019-02-22 ENCOUNTER — Encounter: Payer: Self-pay | Admitting: Internal Medicine

## 2019-02-22 ENCOUNTER — Ambulatory Visit (INDEPENDENT_AMBULATORY_CARE_PROVIDER_SITE_OTHER): Payer: Medicare Other | Admitting: Internal Medicine

## 2019-02-22 ENCOUNTER — Other Ambulatory Visit (INDEPENDENT_AMBULATORY_CARE_PROVIDER_SITE_OTHER): Payer: Medicare Other

## 2019-02-22 DIAGNOSIS — E560 Deficiency of vitamin E: Secondary | ICD-10-CM

## 2019-02-22 DIAGNOSIS — E509 Vitamin A deficiency, unspecified: Secondary | ICD-10-CM

## 2019-02-22 DIAGNOSIS — E54 Ascorbic acid deficiency: Secondary | ICD-10-CM

## 2019-02-22 DIAGNOSIS — E531 Pyridoxine deficiency: Secondary | ICD-10-CM

## 2019-02-22 DIAGNOSIS — K754 Autoimmune hepatitis: Secondary | ICD-10-CM

## 2019-02-22 DIAGNOSIS — E43 Unspecified severe protein-calorie malnutrition: Secondary | ICD-10-CM

## 2019-02-22 DIAGNOSIS — D500 Iron deficiency anemia secondary to blood loss (chronic): Secondary | ICD-10-CM

## 2019-02-22 DIAGNOSIS — K604 Rectal fistula: Secondary | ICD-10-CM

## 2019-02-22 DIAGNOSIS — K50814 Crohn's disease of both small and large intestine with abscess: Secondary | ICD-10-CM | POA: Diagnosis not present

## 2019-02-22 LAB — CBC WITH DIFFERENTIAL/PLATELET
Basophils Absolute: 0 10*3/uL (ref 0.0–0.1)
Basophils Relative: 0.3 % (ref 0.0–3.0)
Eosinophils Absolute: 0.1 10*3/uL (ref 0.0–0.7)
Eosinophils Relative: 0.6 % (ref 0.0–5.0)
HCT: 42.2 % (ref 39.0–52.0)
Hemoglobin: 13.5 g/dL (ref 13.0–17.0)
Lymphocytes Relative: 6.2 % — ABNORMAL LOW (ref 12.0–46.0)
Lymphs Abs: 0.8 10*3/uL (ref 0.7–4.0)
MCHC: 32 g/dL (ref 30.0–36.0)
MCV: 89 fl (ref 78.0–100.0)
Monocytes Absolute: 0.7 10*3/uL (ref 0.1–1.0)
Monocytes Relative: 5.4 % (ref 3.0–12.0)
Neutro Abs: 11.7 10*3/uL — ABNORMAL HIGH (ref 1.4–7.7)
Neutrophils Relative %: 87.5 % — ABNORMAL HIGH (ref 43.0–77.0)
Platelets: 184 10*3/uL (ref 150.0–400.0)
RBC: 4.74 Mil/uL (ref 4.22–5.81)
RDW: 17.9 % — ABNORMAL HIGH (ref 11.5–15.5)
WBC: 13.3 10*3/uL — ABNORMAL HIGH (ref 4.0–10.5)

## 2019-02-22 LAB — COMPREHENSIVE METABOLIC PANEL
ALT: 211 U/L — ABNORMAL HIGH (ref 0–53)
AST: 117 U/L — ABNORMAL HIGH (ref 0–37)
Albumin: 3.6 g/dL (ref 3.5–5.2)
Alkaline Phosphatase: 187 U/L — ABNORMAL HIGH (ref 39–117)
BUN: 28 mg/dL — ABNORMAL HIGH (ref 6–23)
CO2: 31 mEq/L (ref 19–32)
Calcium: 9 mg/dL (ref 8.4–10.5)
Chloride: 102 mEq/L (ref 96–112)
Creatinine, Ser: 1.02 mg/dL (ref 0.40–1.50)
GFR: 75.61 mL/min (ref >60.00)
Glucose, Bld: 93 mg/dL (ref 70–99)
Potassium: 4.3 mEq/L (ref 3.5–5.1)
Sodium: 138 mEq/L (ref 135–145)
Total Bilirubin: 0.4 mg/dL (ref 0.2–1.2)
Total Protein: 5.9 g/dL — ABNORMAL LOW (ref 6.0–8.3)

## 2019-02-22 LAB — FERRITIN: Ferritin: 101.4 ng/mL (ref 22.0–322.0)

## 2019-02-22 MED ORDER — LEVOFLOXACIN 500 MG PO TABS: 500.0000 mg | ORAL_TABLET | Freq: Every day | ORAL | 0 refills | Status: DC

## 2019-02-22 NOTE — Assessment & Plan Note (Signed)
Lab

## 2019-02-22 NOTE — Progress Notes (Signed)
[name] 53 y.o. 1965-09-24 [id number]  Assessment & Plan:  CROHN'S DISEASE, LARGE AND SMALL INTESTINES Continue Entyvio.  He is still fairly early into restarting this at some point may need levels.  Continue seton drainage.  Antibiotics Levaquin x10 days.   Autoimmune hepatitis-PSC overlap Question if LFT increases related to this or other tissue release of transaminases recheck LFTs and check an IgG level  Anemia due to chronic blood loss CBC and ferritin  Perirectal fistula and abscess levaquin   Vitamin A deficiency labs  Vitamin C deficiency labs  Vitamin B6 deficiency Lab   Vitamin E deficiency Lab   Severe protein-calorie malnutrition ([place]) Recheck labs overall I think he is improved though weight is down again but stable  Wt Readings from Last 3 Encounters:  02/22/19 144 lb 6 oz (65.5 kg)  01/18/19 143 lb (64.9 kg)  11/02/18 156 lb (70.8 kg)     Orders Placed This Encounter  Procedures  . IgG  . CBC w/Diff  . Ferritin  . Vitamin A  . Vitamin C  . Vitamin E  . Vitamin B6  . Comprehensive metabolic panel     DH:RCBU, Dwyane Luo, MD Dr. Dossie Der Dr. [name] Subjective:   Chief Complaint: Follow-up of Crohn's abscess  HPI [name] is here for follow-up, he has been having some problems with pain swelling and purulent discharge from one of the fistula tracts where his setons remain.  It is waxed and waned this week we have been my chart messaging about it.  He has one seton where fecal material drains around it and the other is where he has some purulent discharge and more towards the coccyx there is a firm tender area.  No fevers.  Otherwise bowel habits are better he is moving along with his Entyvio and is either getting the first or second every 8-week infusion.  [facility] has told him they are not sure if he will still be able to get his infusions there because of some changes to Medicare reimbursement coming up.   That would be in 2021.  Dr. Marcello Moores took him to the OR on November 19 and placed setons in the right and left posterior peroneal fistula tracts and open the right side partially to allow for better drainage.  That was after drainage of abscesses.  Most of the cavity was positioned in the right posterior lateral ischio rectal space.  He had an existing seton in and she added 2 more than.  I believe the existing one was the one that she removed recently in the office. Allergies  Allergen Reactions  . Asacol [Mesalamine] Diarrhea    abd pain  . Flagyl [Metronidazole] Diarrhea    abd pain  . Amoxicillin Other (See Comments)    Lots of gas  . Azathioprine Diarrhea    abd pain  . Gluten Meal Diarrhea  . Metoprolol Tartrate     Stomach upset  . Mycophenolate Mofetil Diarrhea    abd pain  . Other     NUTS; constipation  . Tylenol [Acetaminophen] Other (See Comments)  . Wheat Bran Diarrhea    Constipation; flatulence; abd pain  . Tape Itching and Rash    Please use "paper" tape   Current Meds  Medication Sig  . clonazePAM (KLONOPIN) 1 MG tablet Take 1 mg by mouth 2 (two) times daily. Takes 1/2 tab (0.10m)  . gabapentin (NEURONTIN) 600 MG tablet TAKE 1 TABLET BY MOUTH AT BEDTIME  . HYDROcodone-acetaminophen (  NORCO/VICODIN) 5-325 MG tablet Take 1 tablet by mouth every 4 (four) hours as needed for moderate pain or severe pain.  . Melatonin 3 MG CAPS Take 3 mg by mouth at bedtime as needed.  . predniSONE (DELTASONE) 20 MG tablet Take 1.5 tablets (30 mg total) by mouth daily with breakfast. (Patient taking differently: Take 20 mg by mouth daily with breakfast. )  . traZODone (DESYREL) 50 MG tablet Take 50 mg by mouth at bedtime as needed.  . vedolizumab (ENTYVIO) 300 MG injection Entyvio 300 mg IV on week 0, 2, 6 then q 8 weeks   Past Medical History:  Diagnosis Date  . Allergy   . Anal fistula   . Anxiety   . 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 Autoimmune hepatitis ([place]) 01/19/2013    liver function checked every 2 or 3 months sees dr Carlean Purl  . Avoidant-restrictive food intake disorder (ARFID) ? 06/15/2018  . Cancer of skin of neck   . Cataract   . Chronic mesenteric ischemia ([place])   . Chronic pain syndrome 07/09/2016  . Crohn's disease of small and large intestines ([place])    followed by dr Glendell Docker Requan Hardge  . Dairy product intolerance   . Diarrhea, functional   . Family history of adverse reaction to anesthesia   . Grover's disease    transient acantholytic dermatosis  . History of alcohol abuse   . History of basal cell carcinoma excision    2013 left leg  . History of Clostridium difficile    10/ 2014  . History of multiple concussions    x6   last one Jan 2017 per pt--  no residual  . History of substance abuse ([place])    quit 1997 per pt  . History of suicide attempt    05-18-2012  overdose /  failure to thrive  . Hypercholesterolemia   . Iron deficiency anemia due to chronic blood loss   . Major depression, recurrent, chronic ([place])   . Osteopenia   . Personal history of adenomatous colonic polyps 12/2010, 03/2012   12/2010 - 8 mm serrated adenoma of rectum  . Portal vein thrombosis 03/21/2015   right  . Primary sclerosing cholangitis    ? hepatitis overlap - liver bx x 2 and MRCP  . Seasonal allergies   . Substance abuse ([place]) 1997   Alcohol  . Vitamin A deficiency 05/23/2018  . Vitamin B6 deficiency 07/27/2018  . Vitamin C deficiency 05/23/2018   Past Surgical History:  Procedure Laterality Date  . ABDOMINAL AORTAGRAM N/A 03/29/2012   Procedure: ABDOMINAL Maxcine Ham;  Surgeon: Serafina Mitchell, MD;  Location: [facility] CATH LAB;  Service: Cardiovascular;  Laterality: N/A;  . ADENOIDECTOMY  age 73  . BIOPSY  05/31/2018   Procedure: BIOPSY;  Surgeon: Milus Banister, MD;  Location: [facility];  Service: Endoscopy;;  . CATARACT EXTRACTION W/ INTRAOCULAR LENS  IMPLANT, BILATERAL  2009  . COLONOSCOPY  2001, 05/02/2003, 01/28/11   2012: Right colon Crohn's, rectal polyp   . COLONOSCOPY  03/31/2012   Procedure: COLONOSCOPY;  Surgeon: Jerene Bears, MD;  Location: [facility];  Service: Gastroenterology;  Laterality: N/A;  . COLONOSCOPY WITH PROPOFOL N/A 05/31/2018   Procedure: COLONOSCOPY WITH PROPOFOL;  Surgeon: Milus Banister, MD;  Location: [facility] ENDOSCOPY;  Service: Endoscopy;  Laterality: N/A;  . ESOPHAGOGASTRODUODENOSCOPY  01/28/11   Normal  . FOOT SURGERY Right age 24  . MOHS SURGERY Left 11/2013   left ankle parakerotosis   .  PERCUTANEOUS LIVER BIOPSY  2007 and 2008  . PILONIDAL CYST EXCISION  age 69  . PLACEMENT OF SETON N/A 11/06/2015   Procedure: PLACEMENT OF SETON;  Surgeon: [name], MD;  Location: [facility];  Service: General;  Laterality: N/A;  . PLACEMENT OF SETON  07/2018   at [facility]  . RECTAL EXAM UNDER ANESTHESIA N/A 01/18/2019   Procedure: ANAL EXAM UNDER ANESTHESIA,  INCISION AND DRAINAGE, SETON PLACEMENT;  Surgeon: [name], MD;  Location: [facility];  Service: General;  Laterality: N/A;  . UPPER GASTROINTESTINAL ENDOSCOPY     Social History   Social History Narrative   Single, history of substance abuse in recovery   Previously occupied on medical disability   1 child  Daughter Theme park manager, lives and works in [place]   Elderly parents involved and he helps them   1 brother in [place]   family history includes Allergies in his father; Breast cancer in his mother; Clotting disorder in his father; Heart disease in his father; Stomach cancer in his mother; Thyroid cancer in his father.   Review of Systems   Objective:   Physical Exam @BP  102/64 (BP Location: Left Arm, Patient Position: Sitting, Cuff Size: Normal)   Pulse 64   Temp 97.7 F (36.5 C) (Oral)   Ht 6' 2"  (1.88 m)   Wt 144 lb 6 oz (65.5 kg)   BMI 18.54 kg/m @  General:  NAD Eyes:   anicteric Lungs:  clear Heart::  S1S2 no rubs, murmurs or gallops Abdomen:  soft and nontender, BS+ Ext:   no edema,  cyanosis or clubbing  Rectal   There is some firmness and mild tenderness superiorly here away from the anus and some purulent material drains from the right sided seton tract when compressed.  A small amount.  Data Reviewed:   See above

## 2019-02-22 NOTE — Assessment & Plan Note (Addendum)
Question if LFT increases related to this or other tissue release of transaminases recheck LFTs and check an IgG level

## 2019-02-22 NOTE — Assessment & Plan Note (Signed)
Recheck labs overall I think he is improved though weight is down again but stable  Wt Readings from Last 3 Encounters:  02/22/19 144 lb 6 oz (65.5 kg)  01/18/19 143 lb (64.9 kg)  11/02/18 156 lb (70.8 kg)

## 2019-02-22 NOTE — Assessment & Plan Note (Signed)
levaquin

## 2019-02-22 NOTE — Assessment & Plan Note (Signed)
labs

## 2019-02-22 NOTE — Patient Instructions (Addendum)
If you are age 55 or younger, your body mass index should be between 19-25. Your Body mass index is 18.54 kg/m. If this is out of the aformentioned range listed, please consider follow up with your Primary Care Provider.   Your provider has requested that you go to the basement level for lab work before leaving today. Press "B" on the elevator. The lab is located at the first door on the left as you exit the elevator.  We have sent the following medications to your pharmacy for you to pick up at your convenience: Levaquin 500 mg daily for 10 days   I appreciate the opportunity to care for you. Silvano Rusk, MD, [facility]

## 2019-02-22 NOTE — Assessment & Plan Note (Addendum)
Continue Entyvio.  He is still fairly early into restarting this at some point may need levels.  Continue seton drainage.  Antibiotics Levaquin x10 days.

## 2019-02-22 NOTE — Assessment & Plan Note (Addendum)
CBC and ferritin

## 2019-02-28 DIAGNOSIS — B029 Zoster without complications: Secondary | ICD-10-CM | POA: Diagnosis not present

## 2019-03-07 ENCOUNTER — Other Ambulatory Visit: Payer: Self-pay | Admitting: Internal Medicine

## 2019-03-07 LAB — NICHOLS-CHANTILLY MISCELLANEOUS ORDER
Miscellaneous Test Results: 594
PRICE:: 88.5

## 2019-03-07 LAB — VITAMIN B6: Vitamin B6: 10.8 ng/mL (ref 2.1–21.7)

## 2019-03-07 LAB — VITAMIN E
Gamma-Tocopherol (Vit E): <1 mg/L (ref <=4.3)
Vitamin E (Alpha Tocopherol): 13.1 mg/L (ref 5.7–19.9)

## 2019-03-07 LAB — VITAMIN A: Vitamin A (Retinoic Acid): 37 ug/dL — ABNORMAL LOW (ref 38–98)

## 2019-03-07 LAB — VITAMIN C: Vitamin C: 2.3 mg/dL — ABNORMAL HIGH (ref 0.2–2.1)

## 2019-03-07 MED ORDER — HYDROCODONE-ACETAMINOPHEN 5-325 MG PO TABS: 1.0000 | ORAL_TABLET | ORAL | 0 refills | Status: DC | PRN

## 2019-03-08 DIAGNOSIS — F3181 Bipolar II disorder: Secondary | ICD-10-CM | POA: Diagnosis not present

## 2019-03-21 DIAGNOSIS — K5090 Crohn's disease, unspecified, without complications: Secondary | ICD-10-CM | POA: Diagnosis not present

## 2019-03-26 ENCOUNTER — Telehealth: Payer: Self-pay

## 2019-03-26 MED ORDER — GABAPENTIN 600 MG PO TABS: 600.0000 mg | ORAL_TABLET | Freq: Every day | ORAL | 1 refills | Status: DC

## 2019-03-26 NOTE — Telephone Encounter (Signed)
Gabapentin refilled as requested and approved.

## 2019-03-26 NOTE — Telephone Encounter (Signed)
Refill # [id number] refill

## 2019-03-26 NOTE — Telephone Encounter (Signed)
[facility] is requesting gabapentin refill, please advise Sir? Thank you.

## 2019-03-27 DIAGNOSIS — F3181 Bipolar II disorder: Secondary | ICD-10-CM | POA: Diagnosis not present

## 2019-04-02 ENCOUNTER — Other Ambulatory Visit: Payer: Self-pay | Admitting: Internal Medicine

## 2019-04-02 DIAGNOSIS — D500 Iron deficiency anemia secondary to blood loss (chronic): Secondary | ICD-10-CM

## 2019-04-02 DIAGNOSIS — K754 Autoimmune hepatitis: Secondary | ICD-10-CM

## 2019-04-03 ENCOUNTER — Other Ambulatory Visit (INDEPENDENT_AMBULATORY_CARE_PROVIDER_SITE_OTHER): Payer: Medicare Other

## 2019-04-03 DIAGNOSIS — K754 Autoimmune hepatitis: Secondary | ICD-10-CM | POA: Diagnosis not present

## 2019-04-03 DIAGNOSIS — D500 Iron deficiency anemia secondary to blood loss (chronic): Secondary | ICD-10-CM | POA: Diagnosis not present

## 2019-04-03 LAB — CBC
HCT: 40.6 % (ref 39.0–52.0)
Hemoglobin: 13.1 g/dL (ref 13.0–17.0)
MCHC: 32.2 g/dL (ref 30.0–36.0)
MCV: 92.2 fl (ref 78.0–100.0)
Platelets: 197 10*3/uL (ref 150.0–400.0)
RBC: 4.4 Mil/uL (ref 4.22–5.81)
RDW: 16.1 % — ABNORMAL HIGH (ref 11.5–15.5)
WBC: 14 10*3/uL — ABNORMAL HIGH (ref 4.0–10.5)

## 2019-04-03 LAB — COMPREHENSIVE METABOLIC PANEL
ALT: 270 U/L — ABNORMAL HIGH (ref 0–53)
AST: 152 U/L — ABNORMAL HIGH (ref 0–37)
Albumin: 3.4 g/dL — ABNORMAL LOW (ref 3.5–5.2)
Alkaline Phosphatase: 278 U/L — ABNORMAL HIGH (ref 39–117)
BUN: 25 mg/dL — ABNORMAL HIGH (ref 6–23)
CO2: 29 mEq/L (ref 19–32)
Calcium: 8.9 mg/dL (ref 8.4–10.5)
Chloride: 100 mEq/L (ref 96–112)
Creatinine, Ser: 1.2 mg/dL (ref 0.40–1.50)
GFR: 62.65 mL/min (ref >60.00)
Glucose, Bld: 100 mg/dL — ABNORMAL HIGH (ref 70–99)
Potassium: 4.6 mEq/L (ref 3.5–5.1)
Sodium: 137 mEq/L (ref 135–145)
Total Bilirubin: 0.4 mg/dL (ref 0.2–1.2)
Total Protein: 6 g/dL (ref 6.0–8.3)

## 2019-04-03 LAB — FERRITIN: Ferritin: 63.4 ng/mL (ref 22.0–322.0)

## 2019-04-04 LAB — IGG: IgG (Immunoglobin G), Serum: 626 mg/dL (ref 600–1640)

## 2019-04-06 ENCOUNTER — Telehealth: Payer: Self-pay | Admitting: *Deleted

## 2019-04-06 ENCOUNTER — Other Ambulatory Visit: Payer: Self-pay | Admitting: Internal Medicine

## 2019-04-06 ENCOUNTER — Other Ambulatory Visit: Payer: Self-pay | Admitting: *Deleted

## 2019-04-06 DIAGNOSIS — K8301 Primary sclerosing cholangitis: Secondary | ICD-10-CM

## 2019-04-06 MED ORDER — HYDROCODONE-ACETAMINOPHEN 5-325 MG PO TABS: 1.0000 | ORAL_TABLET | ORAL | 0 refills | Status: DC | PRN

## 2019-04-06 NOTE — Telephone Encounter (Signed)
Patient scheduled for MRI/MRCP abd (attn: liver) on 04/17/19 at 9:00 am. Patient sent mychart message.

## 2019-04-10 DIAGNOSIS — F3181 Bipolar II disorder: Secondary | ICD-10-CM | POA: Diagnosis not present

## 2019-04-12 ENCOUNTER — Other Ambulatory Visit: Payer: Self-pay

## 2019-04-12 ENCOUNTER — Other Ambulatory Visit: Payer: Self-pay | Admitting: *Deleted

## 2019-04-12 MED ORDER — INJECTAFER 750 MG/15ML IV SOLN: 750.0000 mg | INTRAVENOUS | 0 refills | Status: AC

## 2019-04-13 ENCOUNTER — Telehealth: Payer: Self-pay | Admitting: Internal Medicine

## 2019-04-13 DIAGNOSIS — D509 Iron deficiency anemia, unspecified: Secondary | ICD-10-CM | POA: Diagnosis not present

## 2019-04-13 NOTE — Telephone Encounter (Signed)
Wynona Neat From [facility] is calling says she is treating this patient right now for iron infusion and she needs recent labs faxed over to [phone].

## 2019-04-13 NOTE — Telephone Encounter (Signed)
Labs faxed to [facility] as requested.

## 2019-04-17 ENCOUNTER — Ambulatory Visit (HOSPITAL_COMMUNITY)
Admission: RE | Admit: 2019-04-17 | Discharge: 2019-04-17 | Disposition: A | Discharge status: Home / Self Care | Payer: Medicare Other | Source: Ambulatory Visit | Attending: Internal Medicine | Admitting: Internal Medicine

## 2019-04-17 ENCOUNTER — Other Ambulatory Visit: Payer: Self-pay | Admitting: Internal Medicine

## 2019-04-17 ENCOUNTER — Other Ambulatory Visit: Payer: Self-pay

## 2019-04-17 DIAGNOSIS — K8301 Primary sclerosing cholangitis: Secondary | ICD-10-CM

## 2019-04-17 DIAGNOSIS — R932 Abnormal findings on diagnostic imaging of liver and biliary tract: Secondary | ICD-10-CM | POA: Diagnosis not present

## 2019-04-17 DIAGNOSIS — K7460 Unspecified cirrhosis of liver: Secondary | ICD-10-CM | POA: Diagnosis not present

## 2019-04-17 MED ORDER — GADOBUTROL 1 MMOL/ML IV SOLN
10.0000 mL | Freq: Once | INTRAVENOUS | Status: AC | PRN
  Administered 2019-04-17: 10:00:00 7 mL via INTRAVENOUS

## 2019-04-23 DIAGNOSIS — D509 Iron deficiency anemia, unspecified: Secondary | ICD-10-CM | POA: Diagnosis not present

## 2019-04-24 DIAGNOSIS — F3181 Bipolar II disorder: Secondary | ICD-10-CM | POA: Diagnosis not present

## 2019-04-25 ENCOUNTER — Telehealth: Payer: Self-pay | Admitting: Internal Medicine

## 2019-04-25 NOTE — Telephone Encounter (Signed)
OK 

## 2019-04-25 NOTE — Telephone Encounter (Signed)
Patient's Mom informed that his appointment date is fine.

## 2019-04-25 NOTE — Telephone Encounter (Signed)
Faythe Ghee Sir?

## 2019-05-01 DIAGNOSIS — H04123 Dry eye syndrome of bilateral lacrimal glands: Secondary | ICD-10-CM | POA: Diagnosis not present

## 2019-05-01 DIAGNOSIS — Z961 Presence of intraocular lens: Secondary | ICD-10-CM | POA: Diagnosis not present

## 2019-05-01 DIAGNOSIS — H18513 Endothelial corneal dystrophy, bilateral: Secondary | ICD-10-CM | POA: Diagnosis not present

## 2019-05-01 DIAGNOSIS — H26491 Other secondary cataract, right eye: Secondary | ICD-10-CM | POA: Diagnosis not present

## 2019-05-01 DIAGNOSIS — H43393 Other vitreous opacities, bilateral: Secondary | ICD-10-CM | POA: Diagnosis not present

## 2019-05-03 ENCOUNTER — Encounter: Payer: Self-pay | Admitting: *Deleted

## 2019-05-07 ENCOUNTER — Other Ambulatory Visit: Payer: Self-pay | Admitting: Internal Medicine

## 2019-05-07 MED ORDER — HYDROCODONE-ACETAMINOPHEN 5-325 MG PO TABS: 1.0000 | ORAL_TABLET | ORAL | 0 refills | Status: DC | PRN

## 2019-05-16 DIAGNOSIS — K5090 Crohn's disease, unspecified, without complications: Secondary | ICD-10-CM | POA: Diagnosis not present

## 2019-05-20 ENCOUNTER — Other Ambulatory Visit: Payer: Self-pay | Admitting: Internal Medicine

## 2019-05-20 MED ORDER — LEVOFLOXACIN 500 MG PO TABS: 500.0000 mg | ORAL_TABLET | Freq: Every day | ORAL | 0 refills | Status: DC

## 2019-05-22 DIAGNOSIS — F3181 Bipolar II disorder: Secondary | ICD-10-CM | POA: Diagnosis not present

## 2019-05-22 DIAGNOSIS — F332 Major depressive disorder, recurrent severe without psychotic features: Secondary | ICD-10-CM | POA: Diagnosis not present

## 2019-05-22 DIAGNOSIS — Z23 Encounter for immunization: Secondary | ICD-10-CM | POA: Diagnosis not present

## 2019-05-23 ENCOUNTER — Ambulatory Visit (INDEPENDENT_AMBULATORY_CARE_PROVIDER_SITE_OTHER): Payer: Medicare Other | Admitting: Internal Medicine

## 2019-05-23 ENCOUNTER — Encounter: Payer: Self-pay | Admitting: Internal Medicine

## 2019-05-23 ENCOUNTER — Other Ambulatory Visit (INDEPENDENT_AMBULATORY_CARE_PROVIDER_SITE_OTHER): Payer: Medicare Other

## 2019-05-23 VITALS — BP 104/70 | HR 72 | Temp 97.3°F | Ht 74.0 in | Wt 159.8 lb

## 2019-05-23 DIAGNOSIS — K754 Autoimmune hepatitis: Secondary | ICD-10-CM

## 2019-05-23 DIAGNOSIS — K604 Rectal fistula: Secondary | ICD-10-CM | POA: Diagnosis not present

## 2019-05-23 DIAGNOSIS — K50818 Crohn's disease of both small and large intestine with other complication: Secondary | ICD-10-CM

## 2019-05-23 DIAGNOSIS — D500 Iron deficiency anemia secondary to blood loss (chronic): Secondary | ICD-10-CM

## 2019-05-23 DIAGNOSIS — K50819 Crohn's disease of both small and large intestine with unspecified complications: Secondary | ICD-10-CM

## 2019-05-23 LAB — CBC
HCT: 45.7 % (ref 39.0–52.0)
Hemoglobin: 15.2 g/dL (ref 13.0–17.0)
MCHC: 33.2 g/dL (ref 30.0–36.0)
MCV: 95 fl (ref 78.0–100.0)
Platelets: 181 10*3/uL (ref 150.0–400.0)
RBC: 4.82 Mil/uL (ref 4.22–5.81)
RDW: 17.1 % — ABNORMAL HIGH (ref 11.5–15.5)
WBC: 15.4 10*3/uL — ABNORMAL HIGH (ref 4.0–10.5)

## 2019-05-23 LAB — HEPATIC FUNCTION PANEL
ALT: 240 U/L — ABNORMAL HIGH (ref 0–53)
AST: 105 U/L — ABNORMAL HIGH (ref 0–37)
Albumin: 3.7 g/dL (ref 3.5–5.2)
Alkaline Phosphatase: 156 U/L — ABNORMAL HIGH (ref 39–117)
Bilirubin, Direct: 0.2 mg/dL (ref 0.0–0.3)
Total Bilirubin: 0.4 mg/dL (ref 0.2–1.2)
Total Protein: 6.4 g/dL (ref 6.0–8.3)

## 2019-05-23 LAB — FERRITIN: Ferritin: 568.8 ng/mL — ABNORMAL HIGH (ref 22.0–322.0)

## 2019-05-23 NOTE — Patient Instructions (Addendum)
Please go to the lab today - I will message with results and plans.   I appreciate the opportunity to care for you. [name], MD, [facility]

## 2019-05-23 NOTE — Assessment & Plan Note (Signed)
Continue Entyvio and prednisone would like to get prednisone lower we did not discuss that today he generally adjust to how he feels and that is the best we been able to do and he is aware of potential long-term steroid effects.

## 2019-05-23 NOTE — Progress Notes (Signed)
[name] 54 y.o. 1965/07/30 [id number]  Assessment & Plan:   Perirectal fistula and abscess This seems active again it does appear to be where one of the setons removed, he is responding to Levaquin, follow-up with Dr. Marcello Moores  Anemia due to chronic blood loss Recheck CBC and ferritin  Autoimmune hepatitis-PSC overlap He had an MRI MRCP and it showed stability of bile duct abnormalities and cirrhotic changes 1 would presume his abnormal LFTs are related to this but it has defied explanation before i.e. related to liver issues.  It could have been from vitamin A if he was taking that to excess.  Recheck LFTs   CROHN'S DISEASE, LARGE AND SMALL INTESTINES Continue Entyvio and prednisone would like to get prednisone lower we did not discuss that today he generally adjust to how he feels and that is the best we been able to do and he is aware of potential long-term steroid effects.  CC: [name], MD Dr. [name]   Subjective:   Chief Complaint: Crohn's follow-up abnormal LFTs  HPI [name] had recently message that he was having some abscess and purulent drainage problems so I put him on Levaquin and he said that that is improved.  He has been on it for a few days.  He had one of his setons removed by Dr. Marcello Moores at his last visit there he is due to see her soon.  He thinks it is where the seton was removed that he is having the problem on the left.  Bowel habits are so-so better on the Entyvio.  Energy level is better since he had an iron infusion the other month as well, his ferritin was down below 100 again.  He gets Injectafer.  His LFTs were up again see chart, he says he was on vitamin A then and stopped it.  He continues to take a vitamin D supplement of 5000 units vitamin C at 1000 units and is using a topical vitamin E on the skin. Denies any major pain overall he is feeling better he is concerned because his counselor quit and he was relying heavily on  them, so he has been assigned to another who is overbooked so it is going to be hard to have every 2-week counseling sessions.  He is trying yoga.  MRI MRCP findings 04/17/2019 Cirrhosis. Stable appearance of the posterior right hepatic lobe with localized atrophy in segments 6/7. No suspicious/enhancing hepatic lesions.  Associated focal intrahepatic ductal dilatation in the posterior right hepatic lobe measuring up to 2.1 cm, unchanged from recent priors, progressive from 2017.  Portal vein is patent  Allergies  Allergen Reactions  . Asacol [Mesalamine] Diarrhea    abd pain  . Flagyl [Metronidazole] Diarrhea    abd pain  . Amoxicillin Other (See Comments)    Lots of gas  . Azathioprine Diarrhea    abd pain  . Gluten Meal Diarrhea  . Metoprolol Tartrate     Stomach upset  . Mycophenolate Mofetil Diarrhea    abd pain  . Other     NUTS; constipation  . Tylenol [Acetaminophen] Other (See Comments)  . Wheat Bran Diarrhea    Constipation; flatulence; abd pain  . Tape Itching and Rash    Please use "paper" tape   Current Meds  Medication Sig  . Ascorbic Acid (VITAMIN C) 1000 MG tablet Take 1,000 mg by mouth daily.  . Cholecalciferol (VITAMIN D) 125 MCG (5000 UT) CAPS Take 5,000 Units by mouth daily.  Marland Kitchen  clonazePAM (KLONOPIN) 1 MG tablet Take 1 mg by mouth 2 (two) times daily. Takes 1/2 tab (0.62m)  . gabapentin (NEURONTIN) 600 MG tablet Take 1 tablet (600 mg total) by mouth at bedtime.  .Marland KitchenHYDROcodone-acetaminophen (NORCO/VICODIN) 5-325 MG tablet Take 1 tablet by mouth every 4 (four) hours as needed for moderate pain or severe pain.  .Marland Kitchenlevofloxacin (LEVAQUIN) 500 MG tablet Take 1 tablet (500 mg total) by mouth daily.  . Melatonin 3 MG CAPS Take 3 mg by mouth at bedtime as needed.  . predniSONE (DELTASONE) 20 MG tablet Take 1.5 tablets (30 mg total) by mouth daily with breakfast. (Patient taking differently: Take 20 mg by mouth daily with breakfast. )  . traZODone (DESYREL) 50  MG tablet Take 50 mg by mouth at bedtime as needed.  . vedolizumab (ENTYVIO) 300 MG injection Entyvio 300 mg IV on week 0, 2, 6 then q 8 weeks (Patient taking differently: 300 mg. Entyvio 300 mg IV q 8 weeks)   Past Medical History:  Diagnosis Date  . Allergy   . Anal fistula   . Anxiety   . 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 Autoimmune hepatitis ([place] 01/19/2013   liver function checked every 2 or 3 months sees dr [name] . Avoidant-restrictive food intake disorder (ARFID) ? 06/15/2018  . Cancer of skin of neck   . Cataract   . Chronic mesenteric ischemia ([place]   . Chronic pain syndrome 07/09/2016  . Crohn's disease of small and large intestines (HS[place]    followed by dr [name]  . Dairy product intolerance   . Diarrhea, functional   . Family history of adverse reaction to anesthesia   . Grover's disease    transient acantholytic dermatosis  . History of alcohol abuse   . History of basal cell carcinoma excision    2013 left leg  . History of Clostridium difficile    10/ 2014  . History of multiple concussions    x6   last one Jan 2017 per pt--  no residual  . History of substance abuse (HR[place]    quit 1997 per pt  . History of suicide attempt    05-18-2012  overdose /  failure to thrive  . Hypercholesterolemia   . Iron deficiency anemia due to chronic blood loss   . Major depression, recurrent, chronic (HL[place]   . Osteopenia   . Personal history of adenomatous colonic polyps 12/2010, 03/2012   12/2010 - 8 mm serrated adenoma of rectum  . Portal vein thrombosis 03/21/2015   right  . Primary sclerosing cholangitis    ? hepatitis overlap - liver bx x 2 and MRCP  . Seasonal allergies   . Substance abuse ([place] 1997   Alcohol  . Vitamin A deficiency 05/23/2018  . Vitamin B6 deficiency 07/27/2018  . Vitamin C deficiency 05/23/2018   Past Surgical History:  Procedure Laterality Date  . ABDOMINAL AORTAGRAM N/A 03/29/2012   Procedure: ABDOMINAL AMaxcine Ham  Surgeon: VSerafina Mitchell MD;  [facility];  Service: Cardiovascular;  Laterality: N/A;  . ADENOIDECTOMY  age 54 . BIOPSY  05/31/2018   Procedure: BIOPSY;  Surgeon: JMilus Banister MD;  [facility];  Service: Endoscopy;;  . CATARACT EXTRACTION W/ INTRAOCULAR LENS  IMPLANT, BILATERAL  2009  . COLONOSCOPY  2001, 05/02/2003, 01/28/11   2012: Right colon Crohn's, rectal polyp  . COLONOSCOPY  03/31/2012   Procedure: COLONOSCOPY;  Surgeon: JJerene Bears MD;  [facility];  Service: Gastroenterology;  Laterality: N/A;  . COLONOSCOPY WITH PROPOFOL N/A 05/31/2018   Procedure: COLONOSCOPY WITH PROPOFOL;  Surgeon: Milus Banister, MD;  [facility]: [facility]OSCOPY;  Service: Endoscopy;  Laterality: N/A;  . ESOPHAGOGASTRODUODENOSCOPY  01/28/11   Normal  . FOOT SURGERY Right age 21  . MOHS SURGERY Left 11/2013   left ankle parakerotosis   . PERCUTANEOUS LIVER BIOPSY  2007 and 2008  . PILONIDAL CYST EXCISION  age 1  . PLACEMENT OF SETON N/A 11/06/2015   Procedure: PLACEMENT OF SETON;  Surgeon: [name], MD;  [facility];  Service: General;  Laterality: N/A;  . PLACEMENT OF SETON  07/2018   at [facility]  . RECTAL EXAM UNDER ANESTHESIA N/A 01/18/2019   Procedure: ANAL EXAM UNDER ANESTHESIA,  INCISION AND DRAINAGE, SETON PLACEMENT;  Surgeon: [name], MD;  Location: [place];  Service: General;  Laterality: N/A;  . UPPER GASTROINTESTINAL ENDOSCOPY     Social History   Social History Narrative   Single, history of substance abuse in recovery   Previously occupied on medical disability   1 child  Daughter Theme park manager, lives and works in [place]   Elderly parents involved and he helps them   1 brother in [place]   family history includes Allergies in his father; Breast cancer in his mother; Clotting disorder in his father; Heart disease in his father; Stomach cancer in his mother; Thyroid cancer in his father.   Review of  Systems As above  Objective:   Physical Exam BP 104/70   Pulse 72   Temp (!) 97.3 F (36.3 C)   Ht 6' 2"  (1.88 m)   Wt 159 lb 12.8 oz (72.5 kg)   BMI 20.52 kg/m    Rectal - seton post midline to Right intact with small induration posterior but no drainage  On left there is a small firm nodule and associated opening that is slightly tender and slight bloody discharge

## 2019-05-23 NOTE — Assessment & Plan Note (Signed)
Recheck CBC and ferritin

## 2019-05-23 NOTE — Assessment & Plan Note (Signed)
He had an MRI MRCP and it showed stability of bile duct abnormalities and cirrhotic changes 1 would presume his abnormal LFTs are related to this but it has defied explanation before i.e. related to liver issues.  It could have been from vitamin A if he was taking that to excess.  Recheck LFTs

## 2019-05-23 NOTE — Assessment & Plan Note (Signed)
This seems active again it does appear to be where one of the setons removed, he is responding to Levaquin, follow-up with Dr. Marcello Moores

## 2019-05-24 DIAGNOSIS — K50113 Crohn's disease of large intestine with fistula: Secondary | ICD-10-CM | POA: Diagnosis not present

## 2019-05-24 DIAGNOSIS — K610 Anal abscess: Secondary | ICD-10-CM | POA: Diagnosis not present

## 2019-05-25 ENCOUNTER — Other Ambulatory Visit: Payer: Self-pay | Admitting: Internal Medicine

## 2019-05-25 DIAGNOSIS — K754 Autoimmune hepatitis: Secondary | ICD-10-CM

## 2019-06-05 ENCOUNTER — Other Ambulatory Visit (INDEPENDENT_AMBULATORY_CARE_PROVIDER_SITE_OTHER): Payer: Medicare Other

## 2019-06-05 DIAGNOSIS — K50819 Crohn's disease of both small and large intestine with unspecified complications: Secondary | ICD-10-CM

## 2019-06-05 DIAGNOSIS — K754 Autoimmune hepatitis: Secondary | ICD-10-CM | POA: Diagnosis not present

## 2019-06-05 LAB — HEPATIC FUNCTION PANEL
ALT: 220 U/L — ABNORMAL HIGH (ref 0–53)
AST: 115 U/L — ABNORMAL HIGH (ref 0–37)
Albumin: 3.6 g/dL (ref 3.5–5.2)
Alkaline Phosphatase: 155 U/L — ABNORMAL HIGH (ref 39–117)
Bilirubin, Direct: 0.1 mg/dL (ref 0.0–0.3)
Total Bilirubin: 0.4 mg/dL (ref 0.2–1.2)
Total Protein: 5.9 g/dL — ABNORMAL LOW (ref 6.0–8.3)

## 2019-06-07 ENCOUNTER — Other Ambulatory Visit: Payer: Self-pay | Admitting: Internal Medicine

## 2019-06-07 DIAGNOSIS — K754 Autoimmune hepatitis: Secondary | ICD-10-CM

## 2019-06-07 LAB — QUANTIFERON-TB GOLD PLUS
Mitogen-NIL: 7.39 IU/mL
NIL: 0.03 IU/mL
QuantiFERON-TB Gold Plus: NEGATIVE
TB1-NIL: 0.01 IU/mL
TB2-NIL: 0.01 IU/mL

## 2019-06-07 MED ORDER — HYDROCODONE-ACETAMINOPHEN 5-325 MG PO TABS: 1.0000 | ORAL_TABLET | ORAL | 0 refills | Status: DC | PRN

## 2019-06-11 DIAGNOSIS — K50113 Crohn's disease of large intestine with fistula: Secondary | ICD-10-CM | POA: Diagnosis not present

## 2019-06-12 DIAGNOSIS — M5126 Other intervertebral disc displacement, lumbar region: Secondary | ICD-10-CM | POA: Diagnosis not present

## 2019-06-12 DIAGNOSIS — M9903 Segmental and somatic dysfunction of lumbar region: Secondary | ICD-10-CM | POA: Diagnosis not present

## 2019-06-14 DIAGNOSIS — Z23 Encounter for immunization: Secondary | ICD-10-CM | POA: Diagnosis not present

## 2019-06-19 DIAGNOSIS — M9903 Segmental and somatic dysfunction of lumbar region: Secondary | ICD-10-CM | POA: Diagnosis not present

## 2019-06-19 DIAGNOSIS — M5126 Other intervertebral disc displacement, lumbar region: Secondary | ICD-10-CM | POA: Diagnosis not present

## 2019-06-21 ENCOUNTER — Other Ambulatory Visit (INDEPENDENT_AMBULATORY_CARE_PROVIDER_SITE_OTHER): Payer: Medicare Other

## 2019-06-21 DIAGNOSIS — K754 Autoimmune hepatitis: Secondary | ICD-10-CM | POA: Diagnosis not present

## 2019-06-21 LAB — HEPATIC FUNCTION PANEL
ALT: 222 U/L — ABNORMAL HIGH (ref 0–53)
AST: 133 U/L — ABNORMAL HIGH (ref 0–37)
Albumin: 3.6 g/dL (ref 3.5–5.2)
Alkaline Phosphatase: 191 U/L — ABNORMAL HIGH (ref 39–117)
Bilirubin, Direct: 0.1 mg/dL (ref 0.0–0.3)
Total Bilirubin: 0.5 mg/dL (ref 0.2–1.2)
Total Protein: 6 g/dL (ref 6.0–8.3)

## 2019-06-22 ENCOUNTER — Other Ambulatory Visit: Payer: Self-pay | Admitting: Internal Medicine

## 2019-06-22 LAB — IGG: IgG (Immunoglobin G), Serum: 630 mg/dL (ref 600–1640)

## 2019-06-26 DIAGNOSIS — M9903 Segmental and somatic dysfunction of lumbar region: Secondary | ICD-10-CM | POA: Diagnosis not present

## 2019-06-26 DIAGNOSIS — M5126 Other intervertebral disc displacement, lumbar region: Secondary | ICD-10-CM | POA: Diagnosis not present

## 2019-06-29 DIAGNOSIS — L570 Actinic keratosis: Secondary | ICD-10-CM | POA: Diagnosis not present

## 2019-06-29 DIAGNOSIS — L821 Other seborrheic keratosis: Secondary | ICD-10-CM | POA: Diagnosis not present

## 2019-06-29 DIAGNOSIS — D225 Melanocytic nevi of trunk: Secondary | ICD-10-CM | POA: Diagnosis not present

## 2019-06-29 DIAGNOSIS — L565 Disseminated superficial actinic porokeratosis (DSAP): Secondary | ICD-10-CM | POA: Diagnosis not present

## 2019-06-29 DIAGNOSIS — D2271 Melanocytic nevi of right lower limb, including hip: Secondary | ICD-10-CM | POA: Diagnosis not present

## 2019-06-29 DIAGNOSIS — C44719 Basal cell carcinoma of skin of left lower limb, including hip: Secondary | ICD-10-CM | POA: Diagnosis not present

## 2019-06-29 DIAGNOSIS — L720 Epidermal cyst: Secondary | ICD-10-CM | POA: Diagnosis not present

## 2019-06-29 DIAGNOSIS — Z85828 Personal history of other malignant neoplasm of skin: Secondary | ICD-10-CM | POA: Diagnosis not present

## 2019-06-29 DIAGNOSIS — C44619 Basal cell carcinoma of skin of left upper limb, including shoulder: Secondary | ICD-10-CM | POA: Diagnosis not present

## 2019-06-29 DIAGNOSIS — L111 Transient acantholytic dermatosis [Grover]: Secondary | ICD-10-CM | POA: Diagnosis not present

## 2019-07-03 DIAGNOSIS — M9903 Segmental and somatic dysfunction of lumbar region: Secondary | ICD-10-CM | POA: Diagnosis not present

## 2019-07-03 DIAGNOSIS — M5126 Other intervertebral disc displacement, lumbar region: Secondary | ICD-10-CM | POA: Diagnosis not present

## 2019-07-05 ENCOUNTER — Other Ambulatory Visit: Payer: Self-pay | Admitting: Internal Medicine

## 2019-07-05 MED ORDER — HYDROCODONE-ACETAMINOPHEN 5-325 MG PO TABS: 1.0000 | ORAL_TABLET | ORAL | 0 refills | Status: DC | PRN

## 2019-07-06 DIAGNOSIS — C44619 Basal cell carcinoma of skin of left upper limb, including shoulder: Secondary | ICD-10-CM | POA: Diagnosis not present

## 2019-07-06 DIAGNOSIS — Z85828 Personal history of other malignant neoplasm of skin: Secondary | ICD-10-CM | POA: Diagnosis not present

## 2019-07-06 DIAGNOSIS — C44719 Basal cell carcinoma of skin of left lower limb, including hip: Secondary | ICD-10-CM | POA: Diagnosis not present

## 2019-07-10 DIAGNOSIS — M9903 Segmental and somatic dysfunction of lumbar region: Secondary | ICD-10-CM | POA: Diagnosis not present

## 2019-07-10 DIAGNOSIS — M5126 Other intervertebral disc displacement, lumbar region: Secondary | ICD-10-CM | POA: Diagnosis not present

## 2019-07-11 DIAGNOSIS — F3181 Bipolar II disorder: Secondary | ICD-10-CM | POA: Diagnosis not present

## 2019-07-11 DIAGNOSIS — K5090 Crohn's disease, unspecified, without complications: Secondary | ICD-10-CM | POA: Diagnosis not present

## 2019-07-17 DIAGNOSIS — M9903 Segmental and somatic dysfunction of lumbar region: Secondary | ICD-10-CM | POA: Diagnosis not present

## 2019-07-17 DIAGNOSIS — M5126 Other intervertebral disc displacement, lumbar region: Secondary | ICD-10-CM | POA: Diagnosis not present

## 2019-07-23 DIAGNOSIS — K50113 Crohn's disease of large intestine with fistula: Secondary | ICD-10-CM | POA: Diagnosis not present

## 2019-07-31 DIAGNOSIS — M9903 Segmental and somatic dysfunction of lumbar region: Secondary | ICD-10-CM | POA: Diagnosis not present

## 2019-08-02 ENCOUNTER — Other Ambulatory Visit: Payer: Self-pay | Admitting: Internal Medicine

## 2019-08-02 DIAGNOSIS — G894 Chronic pain syndrome: Secondary | ICD-10-CM

## 2019-08-02 DIAGNOSIS — K754 Autoimmune hepatitis: Secondary | ICD-10-CM

## 2019-08-02 MED ORDER — HYDROCODONE-ACETAMINOPHEN 5-325 MG PO TABS: 1.0000 | ORAL_TABLET | ORAL | 0 refills | Status: DC | PRN

## 2019-08-06 ENCOUNTER — Other Ambulatory Visit: Payer: Self-pay | Admitting: Internal Medicine

## 2019-08-06 DIAGNOSIS — R030 Elevated blood-pressure reading, without diagnosis of hypertension: Secondary | ICD-10-CM | POA: Diagnosis not present

## 2019-08-06 DIAGNOSIS — F411 Generalized anxiety disorder: Secondary | ICD-10-CM | POA: Diagnosis not present

## 2019-08-06 DIAGNOSIS — H43399 Other vitreous opacities, unspecified eye: Secondary | ICD-10-CM | POA: Diagnosis not present

## 2019-08-06 MED ORDER — LEVOFLOXACIN 500 MG PO TABS: 500.0000 mg | ORAL_TABLET | Freq: Every day | ORAL | 0 refills | Status: DC

## 2019-08-07 ENCOUNTER — Other Ambulatory Visit (INDEPENDENT_AMBULATORY_CARE_PROVIDER_SITE_OTHER): Payer: Medicare Other

## 2019-08-07 DIAGNOSIS — K754 Autoimmune hepatitis: Secondary | ICD-10-CM

## 2019-08-07 DIAGNOSIS — G894 Chronic pain syndrome: Secondary | ICD-10-CM

## 2019-08-07 LAB — COMPREHENSIVE METABOLIC PANEL
ALT: 242 U/L — ABNORMAL HIGH (ref 0–53)
AST: 158 U/L — ABNORMAL HIGH (ref 0–37)
Albumin: 3.6 g/dL (ref 3.5–5.2)
Alkaline Phosphatase: 221 U/L — ABNORMAL HIGH (ref 39–117)
BUN: 22 mg/dL (ref 6–23)
CO2: 32 mEq/L (ref 19–32)
Calcium: 8.8 mg/dL (ref 8.4–10.5)
Chloride: 102 mEq/L (ref 96–112)
Creatinine, Ser: 1.05 mg/dL (ref 0.40–1.50)
GFR: 73 mL/min (ref >60.00)
Glucose, Bld: 93 mg/dL (ref 70–99)
Potassium: 3.9 mEq/L (ref 3.5–5.1)
Sodium: 138 mEq/L (ref 135–145)
Total Bilirubin: 0.7 mg/dL (ref 0.2–1.2)
Total Protein: 5.9 g/dL — ABNORMAL LOW (ref 6.0–8.3)

## 2019-08-07 LAB — CBC
HCT: 43.2 % (ref 39.0–52.0)
Hemoglobin: 14.4 g/dL (ref 13.0–17.0)
MCHC: 33.3 g/dL (ref 30.0–36.0)
MCV: 94.9 fl (ref 78.0–100.0)
Platelets: 155 10*3/uL (ref 150.0–400.0)
RBC: 4.55 Mil/uL (ref 4.22–5.81)
RDW: 14.3 % (ref 11.5–15.5)
WBC: 13.3 10*3/uL — ABNORMAL HIGH (ref 4.0–10.5)

## 2019-08-07 NOTE — Addendum Note (Signed)
Addended by: Horris Latino on: 08/07/2019 10:28 AM   Modules accepted: Orders

## 2019-08-09 LAB — DRUG MONITORING, PANEL 8 WITH CONFIRMATION, URINE
6 Acetylmorphine: NEGATIVE ng/mL (ref <10)
Alcohol Metabolites: NEGATIVE ng/mL
Amphetamines: NEGATIVE ng/mL (ref <500)
Benzodiazepines: NEGATIVE ng/mL (ref <100)
Buprenorphine, Urine: NEGATIVE ng/mL (ref <5)
Cocaine Metabolite: NEGATIVE ng/mL (ref <150)
Codeine: NEGATIVE ng/mL (ref <50)
Creatinine: 58.1 mg/dL
Hydrocodone: 174 ng/mL — ABNORMAL HIGH (ref <50)
Hydromorphone: NEGATIVE ng/mL (ref <50)
MDMA: NEGATIVE ng/mL (ref <500)
Marijuana Metabolite: NEGATIVE ng/mL (ref <20)
Morphine: NEGATIVE ng/mL (ref <50)
Norhydrocodone: 137 ng/mL — ABNORMAL HIGH (ref <50)
Opiates: POSITIVE ng/mL — AB (ref <100)
Oxidant: NEGATIVE ug/mL
Oxycodone: NEGATIVE ng/mL (ref <100)
pH: 6.4 (ref 4.5–9.0)

## 2019-08-09 LAB — DM TEMPLATE

## 2019-08-10 ENCOUNTER — Ambulatory Visit (INDEPENDENT_AMBULATORY_CARE_PROVIDER_SITE_OTHER): Payer: Medicare Other | Admitting: Internal Medicine

## 2019-08-10 ENCOUNTER — Encounter: Payer: Self-pay | Admitting: Internal Medicine

## 2019-08-10 VITALS — BP 112/76 | HR 79 | Ht 74.0 in | Wt 158.2 lb

## 2019-08-10 DIAGNOSIS — K50813 Crohn's disease of both small and large intestine with fistula: Secondary | ICD-10-CM

## 2019-08-10 DIAGNOSIS — K754 Autoimmune hepatitis: Secondary | ICD-10-CM

## 2019-08-10 DIAGNOSIS — Z7952 Long term (current) use of systemic steroids: Secondary | ICD-10-CM | POA: Diagnosis not present

## 2019-08-10 DIAGNOSIS — Z79899 Other long term (current) drug therapy: Secondary | ICD-10-CM

## 2019-08-10 DIAGNOSIS — K604 Rectal fistula: Secondary | ICD-10-CM

## 2019-08-10 DIAGNOSIS — Z7960 Long term (current) use of unspecified immunomodulators and immunosuppressants: Secondary | ICD-10-CM

## 2019-08-10 NOTE — Assessment & Plan Note (Signed)
It is plausible this is the cause of his abnormal transaminases though his IgG was not elevated.  I think a liver biopsy is appropriate he is not ready to do this.  We will recheck LFTs in a month after he has had an increase in his steroid dose.

## 2019-08-10 NOTE — Assessment & Plan Note (Signed)
Management per Dr. Marcello Moores with the seton, occasional Levaquin as needed.

## 2019-08-10 NOTE — Assessment & Plan Note (Addendum)
He has never been able to wean this.  He is fully aware of the long-term risks. It would make sense to recheck a bone density test if he is willing.  Recheck vitamin D can add that to his labs in a month.

## 2019-08-10 NOTE — Progress Notes (Signed)
[name] 54 y.o. [birth year] [id number]  Assessment & Plan:   Autoimmune hepatitis-PSC overlap It is plausible this is the cause of his abnormal transaminases though his IgG was not elevated.  I think a liver biopsy is appropriate he is not ready to do this.  We will recheck LFTs in a month after he has had an increase in his steroid dose.  Perirectal fistula and abscess Management per Dr. Marcello Moores with the seton, occasional Levaquin as needed.  CROHN'S DISEASE, LARGE AND SMALL INTESTINES Continue Entyvio.  Remains on prednisone.  Long term current use of systemic steroids He has never been able to wean this.  He is fully aware of the long-term risks. It would make sense to recheck a bone density test if he is willing.  Recheck vitamin D can add that to his labs in a month.   I appreciate the opportunity to care for this patient. CC: [name] Cruel, MD Dr. [name]  Subjective:   Chief Complaint: Follow-up of Crohn's and autoimmune hepatitis/PSC  HPI [name] is a 54 year old white man with a complicated medical history with Crohn's disease of the small and large intestine with fistulization, autoimmune/PSC hepatitis overlap with a history of depression here for follow-up of his gastrointestinal liver problems.  Remains on Entyvio and prednisone.  His transaminases have been elevated lately for unclear reasons.  MRI imaging demonstrated stability of bile duct dilation and no lesions.  I have recommended liver biopsy and he has declined.  He continues to do so but says he increased his prednisone to 30 mg daily and wants to recheck his LFTs in 1 month. He was drinking a nutritional shake and stopped that because it was upsetting his stomach and he is taking a multivitamin now.  He seems to tolerate that.  He is not on any high-dose vitamin A supplements.  His bowel movements are regular and formed maybe a few a day but there is no diarrhea and rare bleeding.  The  bleeding he does have may be coming from his seton site.  Dr. Marcello Moores did remove one seton earlier this year.  He had messaged me stating that he was having some more purulent drainage so he is on another course of Levaquin.  That seems to be helping.  Sometimes he will try to use a needle to help express pus from the abscess area.  Depression is better - doing standing yoga, sitting bothers   Wt Readings from Last 3 Encounters:  08/10/19 158 lb 4 oz (71.8 kg)  05/23/19 159 lb 12.8 oz (72.5 kg)  02/22/19 144 lb 6 oz (65.5 kg)   Small meals frequently eaten.  Still in counselingchiropracter neck and back  5 small basal cell carcinomas removed legs and arm  He is having some floaters and is seeking additional opinion I second opinion from ophthalmology going to [facility]mology. Allergies  Allergen Reactions   Asacol [Mesalamine] Diarrhea    abd pain   Flagyl [Metronidazole] Diarrhea    abd pain   Amoxicillin Other (See Comments)    Lots of gas   Azathioprine Diarrhea    abd pain   Gluten Meal Diarrhea   Metoprolol Tartrate     Stomach upset   Mycophenolate Mofetil Diarrhea    abd pain   Other     NUTS; constipation   Tylenol [Acetaminophen] Other (See Comments)   Wheat Bran Diarrhea    Constipation; flatulence; abd pain   Tape Itching and Rash  Please use "paper" tape   Current Meds  Medication Sig   Ascorbic Acid (VITAMIN C) 1000 MG tablet Take 1,000 mg by mouth daily.   Cholecalciferol (VITAMIN D) 125 MCG (5000 UT) CAPS Take 5,000 Units by mouth daily.   clonazePAM (KLONOPIN) 1 MG tablet Take 1 mg by mouth 2 (two) times daily. Takes 1/2 tab (0.61m)   gabapentin (NEURONTIN) 600 MG tablet Take 1 tablet (600 mg total) by mouth at bedtime.   HYDROcodone-acetaminophen (NORCO/VICODIN) 5-325 MG tablet Take 1 tablet by mouth every 4 (four) hours as needed for moderate pain or severe pain.   levofloxacin (LEVAQUIN) 500 MG tablet Take 1 tablet (500 mg  total) by mouth daily.   Melatonin 3 MG CAPS Take 3 mg by mouth at bedtime as needed.   predniSONE (DELTASONE) 20 MG tablet TAKE 1 AND 1/2 TABLETS (30 MG TOTAL) BY MOUTH DAILY WITH BREAKFAST.   traZODone (DESYREL) 50 MG tablet Take 50 mg by mouth at bedtime as needed.   vedolizumab (ENTYVIO) 300 MG injection Entyvio 300 mg IV on week 0, 2, 6 then q 8 weeks (Patient taking differently: 300 mg. 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 06/15/2018   Cancer of skin of neck    Cataract    Chronic mesenteric ischemia ([name])    Chronic pain syndrome 07/09/2016   Crohn's disease of small and large intestines (HC[place]    followed by dr [name]   Dairy product intolerance    Diarrhea, functional    Family history of adverse reaction to anesthesia    Grover's disease    transient acantholytic dermatosis   History of alcohol abuse    History of basal cell carcinoma excision    2013 left leg   History of Clostridium difficile    10/ 2014   History of multiple concussions    x6   last one Jan 2017 per pt--  no residual   History of substance abuse ([place]    quit 1997 per pt   History of suicide attempt    05-18-2012  overdose /  failure to thrive   Hypercholesterolemia    Iron deficiency anemia due to chronic blood loss    Major depression, recurrent, chronic (HR[place]    Osteopenia    Personal history of adenomatous colonic polyps 12/2010, 03/2012   12/2010 - 8 mm serrated adenoma of rectum   Portal vein thrombosis 03/21/2015   right   Primary sclerosing cholangitis    ? hepatitis overlap - liver bx x 2 and MRCP   Seasonal allergies    Substance abuse ([place] 1997   Alcohol   Vitamin A deficiency  05/23/2018   Vitamin B6 deficiency 07/27/2018   Vitamin C deficiency 05/23/2018   Past Surgical History:  Procedure Laterality Date   ABDOMINAL AORTAGRAM N/A 03/29/2012   Procedure: ABDOMINAL AMaxcine Ham  Surgeon: VSerafina Mitchell MD;  [facility];  Service: Cardiovascular;  Laterality: N/A;   ADENOIDECTOMY  age 54  BIOPSY  05/31/2018   Procedure: BIOPSY;  Surgeon: JMilus Banister MD;  [facility];  Service: Endoscopy;;   CATARACT EXTRACTION W/ INTRAOCULAR LENS  IMPLANT, BILATERAL  2009   COLONOSCOPY  2001, 05/02/2003, 01/28/11   2012: Right colon Crohn's, rectal polyp   COLONOSCOPY  03/31/2012   Procedure: COLONOSCOPY;  Surgeon: Jerene Bears, MD;  Location: [place];  Service: Gastroenterology;  Laterality: N/A;   COLONOSCOPY WITH PROPOFOL N/A 05/31/2018   Procedure: COLONOSCOPY WITH PROPOFOL;  Surgeon: Milus Banister, MD;  [facility];  Service: Endoscopy;  Laterality: N/A;   ESOPHAGOGASTRODUODENOSCOPY  01/28/11   Normal   FOOT SURGERY Right age 78   MOHS SURGERY Left 11/2013   left ankle parakerotosis    PERCUTANEOUS LIVER BIOPSY  2007 and 2008   PILONIDAL CYST EXCISION  age 76   [place] N/A 11/06/2015   Procedure: PLACEMENT OF SETON;  Surgeon: [name], MD;  [facility];  Service: General;  Laterality: N/A;   PLACEMENT OF SETON  07/2018   at [place] N/A 01/18/2019   Procedure: ANAL EXAM UNDER ANESTHESIA,  INCISION AND DRAINAGE, SETON PLACEMENT;  Surgeon: [name], MD;  Location: [place];  Service: General;  Laterality: N/A;   UPPER GASTROINTESTINAL ENDOSCOPY     Social History   Social History Narrative   Single, history of substance abuse in recovery   Previously occupied on medical disability   1 child  Daughter Theme park manager, lives and works in [place]   Elderly parents involved and he helps them   1 brother in [place]     family history includes Allergies in his father; Breast cancer in his mother; Clotting disorder in his father; Heart disease in his father; Stomach cancer in his mother; Thyroid cancer in his father.   Review of Systems Floaters and cloudy vision  Objective:   Physical Exam BP 112/76    Pulse 79    Ht 6' 2"  (1.88 m)    Wt 158 lb 4 oz (71.8 kg)    BMI 20.32 kg/m  Thin NAD Anicteric Lungs cta Cor NL abd soft NT  Rectal - exam see image

## 2019-08-10 NOTE — Assessment & Plan Note (Signed)
Continue Entyvio.  Remains on prednisone.

## 2019-08-10 NOTE — Patient Instructions (Signed)
Please come for lab work 09/04/2019. No appointment needed. The lab is open 7:30AM-5:30PM.   I appreciate the opportunity to care for you. Silvano Rusk, MD, [facility]

## 2019-08-13 ENCOUNTER — Other Ambulatory Visit: Payer: Self-pay | Admitting: Internal Medicine

## 2019-08-13 DIAGNOSIS — E559 Vitamin D deficiency, unspecified: Secondary | ICD-10-CM

## 2019-08-14 ENCOUNTER — Other Ambulatory Visit: Payer: Self-pay

## 2019-08-14 ENCOUNTER — Encounter (HOSPITAL_COMMUNITY): Payer: Self-pay | Admitting: Psychiatric/Mental Health

## 2019-08-14 ENCOUNTER — Telehealth (INDEPENDENT_AMBULATORY_CARE_PROVIDER_SITE_OTHER): Payer: Medicare Other | Admitting: Psychiatric/Mental Health

## 2019-08-14 DIAGNOSIS — F332 Major depressive disorder, recurrent severe without psychotic features: Secondary | ICD-10-CM

## 2019-08-14 DIAGNOSIS — M9903 Segmental and somatic dysfunction of lumbar region: Secondary | ICD-10-CM | POA: Diagnosis not present

## 2019-08-14 DIAGNOSIS — F4310 Post-traumatic stress disorder, unspecified: Secondary | ICD-10-CM | POA: Diagnosis not present

## 2019-08-14 MED ORDER — TRAZODONE HCL 50 MG PO TABS: 50.0000 mg | ORAL_TABLET | Freq: Every day | ORAL | 2 refills | Status: DC

## 2019-08-14 NOTE — Progress Notes (Signed)
Psychiatric Initial Adult Assessment  Virtual Visit via Video Note   I connected with [name] on 08/14/19 by a video enabled telemedicine application and verified that I am speaking with the correct person using two identifiers.   Location: Patient: Home Provider: [place] home office   I discussed the limitations of evaluation and management by telemedicine and the availability of in person appointments. The patient expressed understanding and agreed to proceed.  Patient Identification: [name] MRN:  [id number] Date of Evaluation:  08/14/2019 Referral Source: [facility] Sessions Chief Complaint:  "Im doing Well" Visit Diagnosis:    ICD-10-CM   1. MDD (major depressive disorder), recurrent severe, without psychosis ([place])  F33.2 traZODone (DESYREL) 50 MG tablet  2. PTSD (post-traumatic stress disorder)  F43.10     History of Present Illness: 54 year old male seen today for initial psych evaluation.  Patient was referred to outpatient psychiatry by [facility] for medication management. Patient reports good mood, good sleep and good appetitie. Patient was under the impresson he was scheduled for a counseling/therapy appt. That was the service he was primarily getting from [facility]. Due to the confusion, patient prefers to receive both services through [facility]. Writer assisted in scheduling an appt for June 29 at 1pm with a [facility] therapist. Pt denies SI, HI and AVH.  Associated Signs/Symptoms: Depression Symptoms:  appropriate (Hypo) Manic Symptoms:  na Anxiety Symptoms:  na Psychotic Symptoms:  na PTSD Symptoms: NA  Past Psychiatric History: MDD  Previous Psychotropic Medications: No   Substance Abuse History in the last 12 months:  Yes.    Consequences of Substance Abuse: Medical Consequences:  Surgies, health issues unspecified  Past Medical History:  Past Medical History:  Diagnosis Date  . Allergy   . Anal fistula   . Anxiety   . 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  Autoimmune hepatitis ([place]) 01/19/2013   liver function checked every 2 or 3 months sees dr Carlean Purl  . Avoidant-restrictive food intake disorder (ARFID) ? 06/15/2018  . Cancer of skin of neck   . Cataract   . Chronic mesenteric ischemia ([place])   . Chronic pain syndrome 07/09/2016  . Crohn's disease of small and large intestines ([place])    followed by dr Glendell Docker gessner  . Dairy product intolerance   . Diarrhea, functional   . Family history of adverse reaction to anesthesia   . Grover's disease    transient acantholytic dermatosis  . History of alcohol abuse   . History of basal cell carcinoma excision    2013 left leg  . History of Clostridium difficile    10/ 2014  . History of multiple concussions    x6   last one Jan 2017 per pt--  no residual  . History of substance abuse ([place])    quit 1997 per pt  . History of suicide attempt    05-18-2012  overdose /  failure to thrive  . Hypercholesterolemia   . Iron deficiency anemia due to chronic blood loss   . Major depression, recurrent, chronic ([place])   . Osteopenia   . Personal history of adenomatous colonic polyps 12/2010, 03/2012   12/2010 - 8 mm serrated adenoma of rectum  . Portal vein thrombosis 03/21/2015   right  . Primary sclerosing cholangitis    ? hepatitis overlap - liver bx x 2 and MRCP  . Seasonal allergies   . Substance abuse ([place]) 1997   Alcohol  . Vitamin A deficiency 05/23/2018  .  Vitamin B6 deficiency 07/27/2018  . Vitamin C deficiency 05/23/2018    Past Surgical History:  Procedure Laterality Date  . ABDOMINAL AORTAGRAM N/A 03/29/2012   Procedure: ABDOMINAL Maxcine Ham;  Surgeon: Serafina Mitchell, MD;  Location: [facility] CATH LAB;  Service: Cardiovascular;  Laterality: N/A;  . ADENOIDECTOMY  age 83  . BIOPSY  05/31/2018   Procedure: BIOPSY;  Surgeon: Milus Banister, MD;  Location: [facility] ENDOSCOPY;  Service: Endoscopy;;  . CATARACT EXTRACTION W/ INTRAOCULAR LENS  IMPLANT, BILATERAL  2009  . COLONOSCOPY  2001, 05/02/2003, 01/28/11    2012: Right colon Crohn's, rectal polyp  . COLONOSCOPY  03/31/2012   Procedure: COLONOSCOPY;  Surgeon: Jerene Bears, MD;  Location: [facility];  Service: Gastroenterology;  Laterality: N/A;  . COLONOSCOPY WITH PROPOFOL N/A 05/31/2018   Procedure: COLONOSCOPY WITH PROPOFOL;  Surgeon: Milus Banister, MD;  Location: [facility] ENDOSCOPY;  Service: Endoscopy;  Laterality: N/A;  . ESOPHAGOGASTRODUODENOSCOPY  01/28/11   Normal  . FOOT SURGERY Right age 43  . MOHS SURGERY Left 11/2013   left ankle parakerotosis   . PERCUTANEOUS LIVER BIOPSY  2007 and 2008  . PILONIDAL CYST EXCISION  age 24  . PLACEMENT OF SETON N/A 11/06/2015   Procedure: PLACEMENT OF SETON;  Surgeon: [name], MD;  Location: [facility];  Service: General;  Laterality: N/A;  . PLACEMENT OF SETON  07/2018   at [facility]  . RECTAL EXAM UNDER ANESTHESIA N/A 01/18/2019   Procedure: ANAL EXAM UNDER ANESTHESIA,  INCISION AND DRAINAGE, SETON PLACEMENT;  Surgeon: [name], MD;  Location: [facility];  Service: General;  Laterality: N/A;  . UPPER GASTROINTESTINAL ENDOSCOPY      Family Psychiatric History: unknown  Family History:  Family History  Problem Relation Age of Onset  . Heart disease Father   . Thyroid cancer Father   . Allergies Father   . Clotting disorder Father   . Breast cancer Mother   . Stomach cancer Mother   . Colon cancer Neg Hx   . Esophageal cancer Neg Hx   . Rectal cancer Neg Hx     Social History:   Social History   Socioeconomic History  . Marital status: Single    Spouse name: Not on file  . Number of children: 1  . Years of education: Not on file  . Highest education level: Not on file  Occupational History  . Occupation: Disability   Tobacco Use  . Smoking status: Former Smoker    Packs/day: 1.00    Years: 15.00    Pack years: 15.00    Types: Cigarettes    Quit date: 03/28/1997    Years since quitting: 22.3  . Smokeless tobacco: Never Used  Vaping  Use  . Vaping Use: Never used  Substance and Sexual Activity  . Alcohol use: No    Alcohol/week: 0.0 standard drinks  . Drug use: Not Currently    Types: Cocaine, Marijuana    Comment: QUIT USING DRUGS IN 1997  . Sexual activity: Not Currently  Other Topics Concern  . Not on file  Social History Narrative   Single, history of substance abuse in recovery   Previously occupied on medical disability   1 child  Daughter Theme park manager, lives and works in [place]   Elderly parents involved and he helps them   1 brother in [place]   Social Determinants of Health   Financial Resource Strain:   . Difficulty of Paying Living  Expenses:   Food Insecurity:   . Worried About Charity fundraiser in the Last Year:   . Arboriculturist in the Last Year:   Transportation Needs:   . Film/video editor (Medical):   Marland Kitchen Lack of Transportation (Non-Medical):   Physical Activity:   . Days of Exercise per Week:   . Minutes of Exercise per Session:   Stress:   . Feeling of Stress :   Social Connections:   . Frequency of Communication with Friends and Family:   . Frequency of Social Gatherings with Friends and Family:   . Attends Religious Services:   . Active Member of Clubs or Organizations:   . Attends Archivist Meetings:   Marland Kitchen Marital Status:     Additional Social History: unknown  Allergies:   Allergies  Allergen Reactions  . Asacol [Mesalamine] Diarrhea    abd pain  . Flagyl [Metronidazole] Diarrhea    abd pain  . Amoxicillin Other (See Comments)    Lots of gas  . Azathioprine Diarrhea    abd pain  . Gluten Meal Diarrhea  . Metoprolol Tartrate     Stomach upset  . Mycophenolate Mofetil Diarrhea    abd pain  . Other     NUTS; constipation  . Tylenol [Acetaminophen] Other (See Comments)  . Wheat Bran Diarrhea    Constipation; flatulence; abd pain  . Tape Itching and Rash    Please use "paper" tape    Metabolic Disorder Labs: Lab Results   Component Value Date   HGBA1C 6.1 09/18/2014   No results found for: PROLACTIN Lab Results  Component Value Date   CHOL 288 (H) 02/05/2011   TRIG 76.0 02/05/2011   HDL 62.50 02/05/2011   CHOLHDL 5 02/05/2011   VLDL 15.2 02/05/2011   Lab Results  Component Value Date   TSH 2.053 07/21/2018    Therapeutic Level Labs: No results found for: LITHIUM No results found for: CBMZ No results found for: VALPROATE  Current Medications: Current Outpatient Medications  Medication Sig Dispense Refill  . Ascorbic Acid (VITAMIN C) 1000 MG tablet Take 1,000 mg by mouth daily.    . Cholecalciferol (VITAMIN D) 125 MCG (5000 UT) CAPS Take 5,000 Units by mouth daily.    . clonazePAM (KLONOPIN) 1 MG tablet Take 1 mg by mouth 2 (two) times daily. Takes 1/2 tab (0.50m)    . gabapentin (NEURONTIN) 600 MG tablet Take 1 tablet (600 mg total) by mouth at bedtime. 90 tablet 1  . HYDROcodone-acetaminophen (NORCO/VICODIN) 5-325 MG tablet Take 1 tablet by mouth every 4 (four) hours as needed for moderate pain or severe pain. 30 tablet 0  . levofloxacin (LEVAQUIN) 500 MG tablet Take 1 tablet (500 mg total) by mouth daily. 10 tablet 0  . Melatonin 3 MG CAPS Take 3 mg by mouth at bedtime as needed.    . predniSONE (DELTASONE) 20 MG tablet TAKE 1 AND 1/2 TABLETS (30 MG TOTAL) BY MOUTH DAILY WITH BREAKFAST. 135 tablet 1  . traZODone (DESYREL) 50 MG tablet Take 50 mg by mouth at bedtime as needed.    . traZODone (DESYREL) 50 MG tablet Take 1 tablet (50 mg total) by mouth at bedtime. 30 tablet 2  . vedolizumab (ENTYVIO) 300 MG injection Entyvio 300 mg IV on week 0, 2, 6 then q 8 weeks (Patient taking differently: 300 mg. Entyvio 300 mg IV q 8 weeks) 1 each 9   No current facility-administered medications for this visit.  Musculoskeletal: Strength & Muscle Tone: within normal limits Gait & Station: unable to assess. tele visit Patient leans: N/A  Psychiatric Specialty Exam: Review of Systems  Reason unable  to perform ROS: Video session.    There were no vitals taken for this visit.There is no height or weight on file to calculate BMI.  General Appearance: Casual  Eye Contact:  Good  Speech:  Clear and Coherent  Volume:  Normal  Mood:  NA  Affect:  Appropriate  Thought Process:  Coherent and Descriptions of Associations: Intact  Orientation:  Full (Time, Place, and Person)  Thought Content:  Logical  Suicidal Thoughts:  No  Homicidal Thoughts:  No  Memory:  Recent;   Good  Judgement:  Good  Insight:  Good  Psychomotor Activity:  Normal  Concentration:  Concentration: Good  Recall:  [place] of Knowledge:Fair  Language: Good  Akathisia:  NA  Handed:  Right  AIMS (if indicated):  not done  Assets:  Communication Skills Desire for Improvement Housing Social Support  ADL's:  Intact  Cognition: WNL  Sleep:  Good   Screenings: PHQ2-9     Office Visit from 11/02/2018 in [facility] Patient Outreach Telephone from 08/23/2018 in [facility] Patient Outreach Telephone from 07/28/2018 in [facility] from [facility]  PHQ-2 Total Score 0 3 6 0  PHQ-9 Total Score 4 6 24  --      Assessment and Plan: Patient reports that he is doing well on current medication regimen.  He is agreeable to continue medications as prescribed and requested medication refills.   ICD-10-CM   1. MDD (major depressive disorder), recurrent severe, without psychosis ([place])  F33.2 traZODone (DESYREL) 50 MG tablet  2. PTSD (post-traumatic stress disorder)  F43.10         Deloria Lair, NP 6/15/20211:05 PM

## 2019-08-15 DIAGNOSIS — Z23 Encounter for immunization: Secondary | ICD-10-CM | POA: Diagnosis not present

## 2019-08-15 DIAGNOSIS — S61419A Laceration without foreign body of unspecified hand, initial encounter: Secondary | ICD-10-CM | POA: Diagnosis not present

## 2019-08-28 ENCOUNTER — Other Ambulatory Visit: Payer: Self-pay

## 2019-08-28 ENCOUNTER — Ambulatory Visit (INDEPENDENT_AMBULATORY_CARE_PROVIDER_SITE_OTHER): Payer: Medicare Other | Admitting: Licensed Clinical Social Worker

## 2019-08-28 DIAGNOSIS — M9903 Segmental and somatic dysfunction of lumbar region: Secondary | ICD-10-CM | POA: Diagnosis not present

## 2019-08-28 DIAGNOSIS — F4310 Post-traumatic stress disorder, unspecified: Secondary | ICD-10-CM | POA: Diagnosis not present

## 2019-08-28 DIAGNOSIS — F332 Major depressive disorder, recurrent severe without psychotic features: Secondary | ICD-10-CM | POA: Diagnosis not present

## 2019-08-28 DIAGNOSIS — F411 Generalized anxiety disorder: Secondary | ICD-10-CM | POA: Insufficient documentation

## 2019-08-28 DIAGNOSIS — F320 Major depressive disorder, single episode, mild: Secondary | ICD-10-CM | POA: Insufficient documentation

## 2019-08-28 NOTE — Progress Notes (Signed)
Comprehensive Clinical Assessment (CCA) Note  08/28/2019 [name] [id number]  Visit Diagnosis:      ICD-10-CM   1. MDD (major depressive disorder), recurrent severe, without psychosis ([place])  F33.2   2. Generalized anxiety disorder  F41.1   3. Post traumatic stress disorder (PTSD)  F43.10   4. PTSD (post-traumatic stress disorder)  F43.10     Virtual Visit via Video Note  I connected with [name] on 08/28/19 at  1:00 PM EDT by a video enabled telemedicine application and verified that I am speaking with the correct person using two identifiers.  Location: Patient: [facility]  Provider: [facility]    I discussed the limitations of evaluation and management by telemedicine and the availability of in person appointments. The patient expressed understanding and agreed to proceed.  Client is a 54year old male. Client is referred by [facility]  for a Major Depression and PTSD .   Client states mental health symptoms as evidenced by: anxious, irritable, insomnia, nervousness, worry, hopelessness, Change in energy/activity; Difficulty Concentrating; Fatigue.  Client meets criteria for MDD, GAD, and PTSD     Client denies suicidal and homicidal ideations at this time  Client denies hallucinations and delusions at this time   Client was screened for the following SDOH: smoking, exercise, stress, social interaction and depression   Assessment Information that integrates subjective and objective details with a therapist's professional interpretation:   LCSW and pt met for initial evaluation. Pt was alert and oriented x 5, dressed casually with anxious affect. LCSW notes that pt was well engaged throughout assessment and answered question to best of his ability. Pt states he was [facility] by [facility] for GAD, MDD, and PTSD. From symptoms listed above. Pt overall wants to increase coping skills to deal with daily anxiety of physical condition. [name] states that at 54  years old he fell off a tree and landed in front of father while mowing the lawn he states he can still see the shoe flying off and the bloody towel wrapped around his foot. This is where he reports that his PTSD comes from. Pt states that he had a Hx with alcohol but has been sober for 24 years.        Client states use of the following substances sober for 24 years from Drugs/alcohol    Treatment recommendations are including plan Create coping skills for daily routine.   Goals: Reduce overall level, frequency, and intensity of the anxiety so that daily functioning is not impaired; Stabilize anxiety level while increasing ability to function on a daily basis. Tell the story of anxiety complete with attempts to resolve it and the suggestions others have given; Describe current and past experiences with specific fears, prominent worries, and anxiety symptoms including their impact on functioning and attempts to resolve it; Increase understanding of beliefs and messages that produce the worry and anxiety. Elevate mood and show evidence of usual energy, activities, and socialization level.; Develop healthy interpersonal relationships that lead to alleviation and help prevent the relapse of depression symptoms; Develop the ability to recognize, accept, and cope with feelings of depression. Verbally identify, if possible, the source of depressed mood; Describe the signs and symptoms of depression that are experienced  Objectives: Increase daily activity from 1 x weekly to 3 x weekly. Decrease GAD and PHQ-9 score below 10. Create 2 to 3 coping techniques to decrease anxiety, create 1 activity that pt can do to get out of house with parents.  Clinician assisted client with scheduling the following appointments: 3 weeks. Clinician details of appointment.    Client agreed with treatment recommendations.    I discussed the assessment and treatment plan with the patient. The patient was provided an  opportunity to ask questions and all were answered. The patient agreed with the plan and demonstrated an understanding of the instructions.   The patient was advised to call back or seek an in-person evaluation if the symptoms worsen or if the condition fails to improve as anticipated.  Client agreed with treatment recommendations.    I discussed the assessment and treatment plan with the patient. The patient was provided an opportunity to ask questions and all were answered. The patient agreed with the plan and demonstrated an understanding of the instructions.   The patient was advised to call back or seek an in-person evaluation if the symptoms worsen or if the condition fails to improve as anticipated.  I provided 51 minutes of non-face-to-face time during this encounter.   Dory Horn, LCSW   CCA Screening, Triage and Referral (STR)  Patient Reported Information Referral name: [facility]  Referral phone number: No data recorded  Whom do you see for routine medical problems? Primary Care  Practice/Facility Name: [facility] on [address]  Name of Contact: Harle Battiest MD   What Do You Feel Would Help You the Most Today? Assessment Only;Therapy   Have You Recently Been in Any Inpatient Treatment (Hospital/Detox/Crisis Center/28-Day Program)? No (SI in June of 2020 [facility] 17 days)   Have You Ever Received Services From [facility] Before? Yes  Who Do You See at [facility]? Gastro team in appart of [place] surgery team as well   Have You Recently Had Any Thoughts About Hurting Yourself? No  Are You Planning to Commit Suicide/Harm Yourself At This time? No   Have you Recently Had Thoughts About [place]? No   Have You Used Any Alcohol or Drugs in the Past 24 Hours? No (24 years sober)   Do You Currently Have a Therapist/Psychiatrist? Yes  Name of Therapist/Psychiatrist: Anette Riedel NP   Have You Been Recently Discharged From Any  Office Practice or Programs? No     CCA Screening Triage Referral Assessment Type of Contact: Tele-Assessment  Is this Initial or Reassessment? Initial Assessment  Patient Reported Information Reviewed? Yes  Patient Left Without Being Seen? No  Is CPS involved or ever been involved? Never  Is APS involved or ever been involved? Never   Patient Determined To Be At Risk for Harm To Self or Others Based on Review of Patient Reported Information or Presenting Complaint? No   Location of Assessment: [facility]   Does Patient Present under Involuntary Commitment? No   [place] of Residence: [place]   Patient Currently Receiving the Following Services: Individual Therapy;Medication Management    CCA Biopsychosocial  Intake/Chief Complaint:  CCA Intake With Chief Complaint Chief Complaint/Presenting Problem: anxiety, depression, and PTSD Patient's Currently Reported Symptoms/Problems: anxious, irritable, insomnia, nervousness, worry, hopelessness,  Mental Health Symptoms Depression:  Depression: Change in energy/activity, Difficulty Concentrating, Fatigue, Hopelessness, Sleep (too much or little), Irritability, Worthlessness  Mania:     Anxiety:   Anxiety: Tension, Worrying, Sleep  Psychosis:     Trauma:     Obsessions:     Compulsions:     Inattention:     Hyperactivity/Impulsivity:     Oppositional/Defiant Behaviors:     Emotional Irregularity:  Other Mood/Personality Symptoms:      Mental Status Exam Appearance and self-care  Stature:  Stature: Tall  Weight:  Weight: Thin  Clothing:     Grooming:  Grooming: Normal  Cosmetic use:  Cosmetic Use: None  Posture/gait:  Posture/Gait: Normal  Motor activity:  Motor Activity: Not Remarkable  Sensorium  Attention:  Attention: Normal  Concentration:  Concentration: Normal  Orientation:  Orientation: X5  Recall/memory:  Recall/Memory: Normal  Affect and Mood  Affect:  Affect: Appropriate  Mood:   Mood: Depressed  Relating  Eye contact:  Eye Contact: Normal  Facial expression:  Facial Expression: Depressed  Attitude toward examiner:  Attitude Toward Examiner: Cooperative  Thought and Language  Speech flow: Speech Flow: Clear and Coherent  Thought content:  Thought Content: Appropriate to Mood and Circumstances  Preoccupation:     Hallucinations:     Organization:     Transport planner of Knowledge:  Fund of Knowledge: Good  Intelligence:  Intelligence: Average  Abstraction:  Abstraction: Normal  Judgement:  Judgement: Good  Reality Testing:  Reality Testing: Realistic  Insight:     Decision Making:  Decision Making: Normal  Social Functioning  Social Maturity:  Social Maturity: Responsible  Social Judgement:     Stress  Stressors:  Stressors: Illness, Family conflict  Coping Ability:  Coping Ability: English as a second language teacher Deficits:  Skill Deficits: Communication, Interpersonal  Supports:  Supports: Family     Religion: Religion/Spirituality Are You A Religious Person?: Yes What is Your Religious Affiliation?: Catholic  Leisure/Recreation: Leisure / Recreation Do You Have Hobbies?: Yes Leisure and Hobbies: yard work, art/crafts,  Exercise/Diet: Exercise/Diet Do You Exercise?: Yes What Type of Exercise Do You Do?: Other (Comment) (yard work) How Many Times a Week Do You Exercise?: 1-3 times a week Have You Gained or Lost A Significant Amount of Weight in the Past Six Months?: No Do You Follow a Special Diet?: Yes Type of Diet: eggs, [place], bread Do You Have Any Trouble Sleeping?: Yes Explanation of Sleeping Difficulties: can fall asleep for 2 to 3 hours then will be up for 2 to 3 hours   CCA Employment/Education  Employment/Work Situation: Employment / Work Situation Employment situation: On disability Why is patient on disability: crohn's disease How long has patient been on disability: 2011 Patient's job has been impacted by current illness:  No 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  Education: Education Is Patient Currently Attending School?: No Last Grade Completed: 12 Did Teacher, adult education From [employer]?: Yes Did Physicist, medical?: Yes What Type of College Degree Do you Have?: psychology Did You Attend Graduate School?: No Did You Have An Individualized Education Program (IIEP): No Did You Have Any Difficulty At School?: No Patient's Education Has Been Impacted by Current Illness: No   CCA Family/Childhood History  Family and Relationship History: Family history Marital status: Single Are you sexually active?: No Does patient have children?: Yes How many children?: 1 How is patient's relationship with their children?: good now, email/text contact  Childhood History:  Childhood History By whom was/is the patient raised?: Both parents Description of patient's relationship with caregiver when they were a child: bad; stressful father was critical loud argumentized, mother was over protective. Patient's description of current relationship with people who raised him/her: live together; pretty good/steady How were you disciplined when you got in trouble as a child/adolescent?: groundings Does patient have siblings?: Yes Number of Siblings: 1 Description of patient's  current relationship with siblings: good now, it was bad growing up Did patient suffer any verbal/emotional/physical/sexual abuse as a child?: Yes Did patient suffer from severe childhood neglect?: No Has patient ever been sexually abused/assaulted/raped as an adolescent or adult?: No Was the patient ever a victim of a crime or a disaster?: No Witnessed domestic violence?: No Has patient been affected by domestic violence as an adult?: No     DSM5 Diagnoses: Patient Active Problem List   Diagnosis Date Noted  . Current mild episode of major depressive disorder ([place]) 08/28/2019  . Generalized anxiety disorder  08/28/2019  . PTSD (post-traumatic stress disorder) 08/14/2019  . Vitamin B6 deficiency 07/27/2018  . Vitamin E deficiency 07/27/2018  . Avoidant-restrictive food intake disorder (ARFID) ? 06/15/2018  . Dilated intrahepatic bile ducts   . Vitamin C deficiency 05/23/2018  . Vitamin A deficiency 05/23/2018  . Loss of weight 04/07/2018  . Grover's disease 11/04/2017  . Chronic pain syndrome 07/09/2016  . Perirectal fistula and abscess 05/18/2016  . Insomnia 11/29/2014  . Long-term use of immunosuppressant medication - Entyvio 11/28/2014  . Posterior Anal fissure 10/03/2014  . Anemia due to chronic blood loss 02/11/2014  . 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 Rash on trunk 12/31/2012  . Arthralgia 08/28/2012  . Severe protein-calorie malnutrition ([place]) 04/12/2012  . MDD (major depressive disorder), recurrent severe, without psychosis ([place]) 04/12/2012  . Personal history of adenomatous colonic polyps 01/18/2012  . Osteopenia 02/17/2011  . Long term current use of systemic steroids 08/11/2010  . Essential hypertension 08/09/2008  . Vitamin D deficiency 09/26/2007  . HYPERLIPIDEMIA, SEVERE 09/26/2007  . CROHN'S DISEASE, LARGE AND SMALL INTESTINES 03/28/2007    Patient Centered Plan: Patient is on the following Treatment Plan(s):  Anxiety and Depression    Dory Horn

## 2019-09-04 ENCOUNTER — Other Ambulatory Visit: Payer: Self-pay | Admitting: Internal Medicine

## 2019-09-04 MED ORDER — HYDROCODONE-ACETAMINOPHEN 5-325 MG PO TABS: 1.0000 | ORAL_TABLET | ORAL | 0 refills | Status: DC | PRN

## 2019-09-06 DIAGNOSIS — M9903 Segmental and somatic dysfunction of lumbar region: Secondary | ICD-10-CM | POA: Diagnosis not present

## 2019-09-10 ENCOUNTER — Other Ambulatory Visit (INDEPENDENT_AMBULATORY_CARE_PROVIDER_SITE_OTHER): Payer: Medicare Other

## 2019-09-10 DIAGNOSIS — K754 Autoimmune hepatitis: Secondary | ICD-10-CM | POA: Diagnosis not present

## 2019-09-10 DIAGNOSIS — E559 Vitamin D deficiency, unspecified: Secondary | ICD-10-CM

## 2019-09-10 DIAGNOSIS — K50813 Crohn's disease of both small and large intestine with fistula: Secondary | ICD-10-CM | POA: Diagnosis not present

## 2019-09-10 DIAGNOSIS — Z79899 Other long term (current) drug therapy: Secondary | ICD-10-CM

## 2019-09-10 DIAGNOSIS — Z7952 Long term (current) use of systemic steroids: Secondary | ICD-10-CM

## 2019-09-10 DIAGNOSIS — Z7960 Long term (current) use of unspecified immunomodulators and immunosuppressants: Secondary | ICD-10-CM

## 2019-09-10 LAB — HEPATIC FUNCTION PANEL
ALT: 280 U/L — ABNORMAL HIGH (ref 0–53)
AST: 126 U/L — ABNORMAL HIGH (ref 0–37)
Albumin: 3.6 g/dL (ref 3.5–5.2)
Alkaline Phosphatase: 160 U/L — ABNORMAL HIGH (ref 39–117)
Bilirubin, Direct: 0.1 mg/dL (ref 0.0–0.3)
Total Bilirubin: 0.5 mg/dL (ref 0.2–1.2)
Total Protein: 5.9 g/dL — ABNORMAL LOW (ref 6.0–8.3)

## 2019-09-10 LAB — VITAMIN D 25 HYDROXY (VIT D DEFICIENCY, FRACTURES): VITD: 80.15 ng/mL (ref 30.00–100.00)

## 2019-09-11 ENCOUNTER — Other Ambulatory Visit: Payer: Self-pay | Admitting: Internal Medicine

## 2019-09-11 DIAGNOSIS — K754 Autoimmune hepatitis: Secondary | ICD-10-CM

## 2019-09-11 DIAGNOSIS — K8301 Primary sclerosing cholangitis: Secondary | ICD-10-CM

## 2019-09-11 NOTE — Progress Notes (Signed)
u

## 2019-09-14 ENCOUNTER — Other Ambulatory Visit: Payer: Self-pay | Admitting: Internal Medicine

## 2019-09-17 ENCOUNTER — Other Ambulatory Visit: Payer: Self-pay | Admitting: Radiology

## 2019-09-17 DIAGNOSIS — M9903 Segmental and somatic dysfunction of lumbar region: Secondary | ICD-10-CM | POA: Diagnosis not present

## 2019-09-18 ENCOUNTER — Other Ambulatory Visit: Payer: Self-pay

## 2019-09-18 ENCOUNTER — Ambulatory Visit (HOSPITAL_COMMUNITY)
Admission: RE | Admit: 2019-09-18 | Discharge: 2019-09-18 | Disposition: A | Discharge status: Home / Self Care | Payer: Medicare Other | Source: Ambulatory Visit | Attending: Internal Medicine | Admitting: Internal Medicine

## 2019-09-18 ENCOUNTER — Encounter (HOSPITAL_COMMUNITY): Payer: Self-pay

## 2019-09-18 DIAGNOSIS — Z79899 Other long term (current) drug therapy: Secondary | ICD-10-CM | POA: Diagnosis not present

## 2019-09-18 DIAGNOSIS — K754 Autoimmune hepatitis: Secondary | ICD-10-CM | POA: Diagnosis not present

## 2019-09-18 DIAGNOSIS — K8301 Primary sclerosing cholangitis: Secondary | ICD-10-CM | POA: Diagnosis not present

## 2019-09-18 DIAGNOSIS — E7800 Pure hypercholesterolemia, unspecified: Secondary | ICD-10-CM | POA: Diagnosis not present

## 2019-09-18 DIAGNOSIS — K5080 Crohn's disease of both small and large intestine without complications: Secondary | ICD-10-CM | POA: Insufficient documentation

## 2019-09-18 DIAGNOSIS — Z87891 Personal history of nicotine dependence: Secondary | ICD-10-CM | POA: Diagnosis not present

## 2019-09-18 DIAGNOSIS — F419 Anxiety disorder, unspecified: Secondary | ICD-10-CM | POA: Insufficient documentation

## 2019-09-18 DIAGNOSIS — K7400 Hepatic fibrosis, unspecified: Secondary | ICD-10-CM | POA: Diagnosis not present

## 2019-09-18 LAB — PROTIME-INR
INR: 1 (ref 0.8–1.2)
Prothrombin Time: 12.5 seconds (ref 11.4–15.2)

## 2019-09-18 LAB — CBC
HCT: 49.4 % (ref 39.0–52.0)
Hemoglobin: 15.8 g/dL (ref 13.0–17.0)
MCH: 31.2 pg (ref 26.0–34.0)
MCHC: 32 g/dL (ref 30.0–36.0)
MCV: 97.6 fL (ref 80.0–100.0)
Platelets: 166 10*3/uL (ref 150–400)
RBC: 5.06 MIL/uL (ref 4.22–5.81)
RDW: 14 % (ref 11.5–15.5)
WBC: 13.6 10*3/uL — ABNORMAL HIGH (ref 4.0–10.5)
nRBC: 0 % (ref 0.0–0.2)

## 2019-09-18 MED ORDER — GELATIN ABSORBABLE 12-7 MM EX MISC
CUTANEOUS | Status: AC
  Filled 2019-09-18: qty 1

## 2019-09-18 MED ORDER — FENTANYL CITRATE (PF) 100 MCG/2ML IJ SOLN
INTRAMUSCULAR | Status: AC
  Filled 2019-09-18: qty 2

## 2019-09-18 MED ORDER — MIDAZOLAM HCL 2 MG/2ML IJ SOLN
INTRAMUSCULAR | Status: AC | PRN
  Administered 2019-09-18: 0.5 mg via INTRAVENOUS
  Administered 2019-09-18: 1 mg via INTRAVENOUS

## 2019-09-18 MED ORDER — MIDAZOLAM HCL 2 MG/2ML IJ SOLN
INTRAMUSCULAR | Status: AC
  Filled 2019-09-18: qty 2

## 2019-09-18 MED ORDER — SODIUM CHLORIDE 0.9 % IV SOLN: INTRAVENOUS | Status: DC

## 2019-09-18 MED ORDER — FENTANYL CITRATE (PF) 100 MCG/2ML IJ SOLN
INTRAMUSCULAR | Status: AC | PRN
  Administered 2019-09-18: 25 ug via INTRAVENOUS
  Administered 2019-09-18: 50 ug via INTRAVENOUS

## 2019-09-18 MED ORDER — LIDOCAINE HCL 1 % IJ SOLN
INTRAMUSCULAR | Status: AC
  Filled 2019-09-18: qty 20

## 2019-09-18 NOTE — H&P (Signed)
Chief Complaint: Autoimmune hepatitis  Referring Physician(s): Silvano Rusk  Supervising Physician: Daryll Brod  Patient Status: [facility] - Out-pt  History of Present Illness: [name] is a 54 y.o. male with Crohn's disease of the small and large intestine with fistulization, autoimmune/PSC hepatitis.  He takes Entyvio and prednisone. He has been unable to wean the steroids.  His transaminases have been elevated for unclear reasons and could be secondary to autoimmune hepatitis - PSC overlap.  He is here today for random liver biopsy.  He is NPO. No nausea/vomiting. No Fever/chills. ROS negative.  No blood thinners.  Past Medical History:  Diagnosis Date  . Allergy   . Anal fistula   . Anxiety   . 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 Autoimmune hepatitis ([place]) 01/19/2013   liver function checked every 2 or 3 months sees dr Carlean Purl  . Avoidant-restrictive food intake disorder (ARFID) ? 06/15/2018  . Cancer of skin of neck   . Cataract   . Chronic mesenteric ischemia ([place])   . Chronic pain syndrome 07/09/2016  . Crohn's disease of small and large intestines ([place])    followed by dr Glendell Docker [facility]  . Dairy product intolerance   . Diarrhea, functional   . Family history of adverse reaction to anesthesia   . Grover's disease    transient acantholytic dermatosis  . History of alcohol abuse   . History of basal cell carcinoma excision    2013 left leg  . History of Clostridium difficile    10/ 2014  . History of multiple concussions    x6   last one Jan 2017 per pt--  no residual  . History of substance abuse ([place])    quit 1997 per pt  . History of suicide attempt    05-18-2012  overdose /  failure to thrive  . Hypercholesterolemia   . Iron deficiency anemia due to chronic blood loss   . Major depression, recurrent, chronic ([place])   . Osteopenia   . Personal history of adenomatous colonic polyps 12/2010, 03/2012   12/2010 - 8 mm serrated adenoma of  rectum  . Portal vein thrombosis 03/21/2015   right  . Primary sclerosing cholangitis    ? hepatitis overlap - liver bx x 2 and MRCP  . Seasonal allergies   . Substance abuse ([place]) 1997   Alcohol  . Vitamin A deficiency 05/23/2018  . Vitamin B6 deficiency 07/27/2018  . Vitamin C deficiency 05/23/2018    Past Surgical History:  Procedure Laterality Date  . ABDOMINAL AORTAGRAM N/A 03/29/2012   Procedure: ABDOMINAL Maxcine Ham;  Surgeon: Serafina Mitchell, MD;  Location: [facility] CATH LAB;  Service: Cardiovascular;  Laterality: N/A;  . ADENOIDECTOMY  age 54  . BIOPSY  05/31/2018   Procedure: BIOPSY;  Surgeon: Milus Banister, MD;  Location: [facility];  Service: Endoscopy;;  . CATARACT EXTRACTION W/ INTRAOCULAR LENS  IMPLANT, BILATERAL  2009  . COLONOSCOPY  2001, 05/02/2003, 01/28/11   2012: Right colon Crohn's, rectal polyp  . COLONOSCOPY  03/31/2012   Procedure: COLONOSCOPY;  Surgeon: Jerene Bears, MD;  Location: [facility];  Service: Gastroenterology;  Laterality: N/A;  . COLONOSCOPY WITH PROPOFOL N/A 05/31/2018   Procedure: COLONOSCOPY WITH PROPOFOL;  Surgeon: Milus Banister, MD;  Location: [facility];  Service: Endoscopy;  Laterality: N/A;  . ESOPHAGOGASTRODUODENOSCOPY  01/28/11   Normal  . FOOT SURGERY Right age 12  . MOHS SURGERY Left 11/2013   left ankle  parakerotosis   . PERCUTANEOUS LIVER BIOPSY  2007 and 2008  . PILONIDAL CYST EXCISION  age 56  . PLACEMENT OF SETON N/A 11/06/2015   Procedure: PLACEMENT OF SETON;  Surgeon: [name], MD;  Location: [facility];  Service: General;  Laterality: N/A;  . PLACEMENT OF SETON  07/2018   at [facility]  . RECTAL EXAM UNDER ANESTHESIA N/A 01/18/2019   Procedure: ANAL EXAM UNDER ANESTHESIA,  INCISION AND DRAINAGE, SETON PLACEMENT;  Surgeon: [name], MD;  Location: [facility];  Service: General;  Laterality: N/A;  . UPPER GASTROINTESTINAL ENDOSCOPY      Allergies: Asacol [mesalamine], Flagyl  [metronidazole], Amoxicillin, Azathioprine, Gluten meal, Metoprolol tartrate, Mycophenolate mofetil, Other, Tylenol [acetaminophen], Wheat bran, and Tape  Medications: Prior to Admission medications   Medication Sig Start Date End Date Taking? Authorizing Provider  Ascorbic Acid (VITAMIN C) 1000 MG tablet Take 1,000 mg by mouth daily.    [provider]  Cholecalciferol (VITAMIN D) 125 MCG (5000 UT) CAPS Take 5,000 Units by mouth daily.    [provider]  clonazePAM (KLONOPIN) 1 MG tablet Take 1 mg by mouth 2 (two) times daily. Takes 1/2 tab (0.71m)    [provider]  gabapentin (NEURONTIN) 600 MG tablet Take 1 tablet (600 mg total) by mouth at bedtime. 03/26/19   GGatha Mayer MD  HYDROcodone-acetaminophen (NORCO/VICODIN) 5-325 MG tablet Take 1 tablet by mouth every 4 (four) hours as needed for moderate pain or severe pain. 09/04/19   GGatha Mayer MD  levofloxacin (LEVAQUIN) 500 MG tablet Take 1 tablet (500 mg total) by mouth daily. 08/06/19   GGatha Mayer MD  Melatonin 3 MG CAPS Take 3 mg by mouth at bedtime as needed.    [provider]  predniSONE (DELTASONE) 20 MG tablet TAKE 1 AND 1/2 TABLETS (30 MG TOTAL) BY MOUTH DAILY WITH BREAKFAST. 06/22/19   GGatha Mayer MD  traZODone (DESYREL) 50 MG tablet Take 50 mg by mouth at bedtime as needed. 10/12/18   [provider]  traZODone (DESYREL) 50 MG tablet Take 1 tablet (50 mg total) by mouth at bedtime. 08/14/19 11/12/19  DDeloria Lair NP  vedolizumab (ENTYVIO) 300 MG injection Entyvio 300 mg IV on week 0, 2, 6 then q 8 weeks Patient taking differently: 300 mg. Entyvio 300 mg IV q 8 weeks 11/07/18   GGatha Mayer MD     Family History  Problem Relation Age of Onset  . Heart disease Father   . Thyroid cancer Father   . Allergies Father   . Clotting disorder Father   . Breast cancer Mother   . Stomach cancer Mother   . Colon cancer Neg Hx   . Esophageal cancer Neg Hx   . Rectal cancer  Neg Hx     Social History   Socioeconomic History  . Marital status: Single    Spouse name: Not on file  . Number of children: 1  . Years of education: Not on file  . Highest education level: Not on file  Occupational History  . Occupation: Disability   Tobacco Use  . Smoking status: Former Smoker    Packs/day: 1.00    Years: 15.00    Pack years: 15.00    Types: Cigarettes    Quit date: 03/28/1997    Years since quitting: 22.4  . Smokeless tobacco: Never Used  Vaping Use  . Vaping Use: Never used  Substance and Sexual Activity  .  Alcohol use: No    Alcohol/week: 0.0 standard drinks  . Drug use: Not Currently    Types: Cocaine, Marijuana    Comment: QUIT USING DRUGS IN 1997  . Sexual activity: Not Currently  Other Topics Concern  . Not on file  Social History Narrative   Single, history of substance abuse in recovery   Previously occupied on medical disability   1 child  Daughter Theme park manager, lives and works in [place]   Elderly parents involved and he helps them   1 brother in [place]   Social Determinants of Health   Financial Resource Strain: [place]   . Difficulty of Paying Living Expenses: Not hard at all  Food Insecurity: No Food Insecurity  . Worried About Charity fundraiser in the Last Year: Never true  . Ran Out of Food in the Last Year: Never true  Transportation Needs: No Transportation Needs  . Lack of Transportation (Medical): No  . Lack of Transportation (Non-Medical): No  Physical Activity: Insufficiently Active  . Days of Exercise per Week: 1 day  . Minutes of Exercise per Session: 60 min  Stress: Stress Concern Present  . Feeling of Stress : Very much  Social Connections: Socially Isolated  . Frequency of Communication with Friends and Family: Once a week  . Frequency of Social Gatherings with Friends and Family: Three times a week  . Attends Religious Services: Never  . Active Member of Clubs or Organizations: No  . Attends  Archivist Meetings: Never  . Marital Status: Never married     Review of Systems: A 12 point ROS discussed and pertinent positives are indicated in the HPI above.  All other systems are negative.  Review of Systems  Vital Signs: BP 128/78   Pulse (!) 53   Temp 98.3 F (36.8 C) (Oral)   Resp 16   Ht 6' 2"  (1.88 m)   Wt 72.6 kg   SpO2 100%   BMI 20.54 kg/m   Physical Exam Vitals reviewed.  Constitutional:      Appearance: Normal appearance.  HENT:     Head: Normocephalic and atraumatic.  Eyes:     Extraocular Movements: Extraocular movements intact.  Cardiovascular:     Rate and Rhythm: Normal rate and regular rhythm.  Pulmonary:     Effort: Pulmonary effort is normal. No respiratory distress.     Breath sounds: Normal breath sounds.  Abdominal:     General: There is no distension.     Palpations: Abdomen is soft.     Tenderness: There is no abdominal tenderness.  Musculoskeletal:        General: Normal range of motion.     Cervical back: Normal range of motion.  Skin:    General: Skin is warm and dry.  Neurological:     General: No focal deficit present.     Mental Status: He is alert and oriented to person, place, and time.  Psychiatric:        Mood and Affect: Mood normal.        Behavior: Behavior normal.        Thought Content: Thought content normal.        Judgment: Judgment normal.     Imaging: No results found.  Labs:  CBC: Recent Labs    02/22/19 1132 04/03/19 1125 05/23/19 1019 08/07/19 1024  WBC 13.3* 14.0* 15.4* 13.3*  HGB 13.5 13.1 15.2 14.4  HCT 42.2 40.6 45.7 43.2  PLT  184.0 197.0 181.0 155.0    COAGS: No results for input(s): INR, APTT in the last 8760 hours.  BMP: Recent Labs    01/18/19 0830 02/22/19 1132 04/03/19 1125 08/07/19 1024  NA 139 138 137 138  K 3.9 4.3 4.6 3.9  CL 105 102 100 102  CO2 23 31 29  32  GLUCOSE 76 93 100* 93  BUN 22* 28* 25* 22  CALCIUM 8.8* 9.0 8.9 8.8  CREATININE 0.84 1.02 1.20  1.05  GFRNONAA >60  --   --   --   GFRAA >60  --   --   --     LIVER FUNCTION TESTS: Recent Labs    06/05/19 1046 06/21/19 1047 08/07/19 1024 09/10/19 1019  BILITOT 0.4 0.5 0.7 0.5  AST 115* 133* 158* 126*  ALT 220* 222* 242* 280*  ALKPHOS 155* 191* 221* 160*  PROT 5.9* 6.0 5.9* 5.9*  ALBUMIN 3.6 3.6 3.6 3.6    TUMOR MARKERS: No results for input(s): AFPTM, CEA, CA199, CHROMGRNA in the last 8760 hours.  Assessment and Plan:  Crohn's disease of the small and large intestine with fistulization, autoimmune/PSC hepatitis.  Will proceed with image guided random liver biopsy today by Dr. Annamaria Boots.  Risks and benefits of liver biopsy was discussed with the patient and/or patient's family including, but not limited to bleeding, infection, damage to adjacent structures or low yield requiring additional tests.  All of the questions were answered and there is agreement to proceed.  Consent signed and in chart.  Thank you for this interesting consult.  I greatly enjoyed meeting BYRAN BILOTTI and look forward to participating in their care.  A copy of this report was sent to the requesting provider on this date.  Electronically Signed: Murrell Redden, PA-C   09/18/2019, 11:54 AM      I spent a total of  30 Minutes in face to face in clinical consultation, greater than 50% of which was counseling/coordinating care for random liver biopsy.

## 2019-09-18 NOTE — Procedures (Signed)
Interventional Radiology Procedure Note  Procedure: [place] RANDOM LEFT LIVER BX  Complications: None  Estimated Blood Loss: MIN  Findings: 18 G CORES X 2

## 2019-09-19 ENCOUNTER — Other Ambulatory Visit: Payer: Self-pay

## 2019-09-19 NOTE — Discharge Instructions (Signed)
Please call Interventional Radiology clinic [phone] with any questions or concerns.  You may remove your dressing and shower tomorrow.  Liver Biopsy, Care After These instructions give you information on caring for yourself after your procedure. Your doctor may also give you more specific instructions. Call your doctor if you have any problems or questions after your procedure. What can I expect after the procedure? After the procedure, it is common to have:  Pain and soreness where the biopsy was done.  Bruising around the area where the biopsy was done.  Sleepiness and be tired for a few days. Follow these instructions at home: Medicines  Take over-the-counter and prescription medicines only as told by your doctor.  If you were prescribed an antibiotic medicine, take it as told by your doctor. Do not stop taking the antibiotic even if you start to feel better.  Do not take medicines such as aspirin and ibuprofen. These medicines can thin your blood. Do not take these medicines unless your doctor tells you to take them.  If you are taking prescription pain medicine, take actions to prevent or treat constipation. Your doctor may recommend that you: ? Drink enough fluid to keep your pee (urine) clear or pale yellow. ? Take over-the-counter or prescription medicines. ? Eat foods that are high in fiber, such as fresh fruits and vegetables, whole grains, and beans. ? Limit foods that are high in fat and processed sugars, such as fried and sweet foods. Caring for your cut  Follow instructions from your doctor about how to take care of your cuts from surgery (incisions). Make sure you: ? Wash your hands with soap and water before you change your bandage (dressing). If you cannot use soap and water, use hand sanitizer. ? Change your bandage as told by your doctor. ? Leave stitches (sutures), skin glue, or skin tape (adhesive) strips in place. They may need to stay in place for 2 weeks  or longer. If tape strips get loose and curl up, you may trim the loose edges. Do not remove tape strips completely unless your doctor says it is okay.  Check your cuts every day for signs of infection. Check for: ? Redness, swelling, or more pain. ? Fluid or blood. ? Pus or a bad smell. ? Warmth.  Do not take baths, swim, or use a hot tub until your doctor says it is okay to do so. Activity   Rest at home for 1-2 days or as told by your doctor. ? Avoid sitting for a long time without moving. Get up to take short walks every 1-2 hours.  Return to your normal activities as told by your doctor. Ask what activities are safe for you.  Do not do these things in the first 24 hours: ? Drive. ? Use machinery. ? Take a bath or shower.  Do not lift more than 10 pounds (4.5 kg) or play contact sports for the first 2 weeks. General instructions   Do not drink alcohol in the first week after the procedure.  Have someone stay with you for at least 24 hours after the procedure.  Get your test results. Ask your doctor or the department that is doing the test: ? When will my results be ready? ? How will I get my results? ? What are my treatment options? ? What other tests do I need? ? What are my next steps?  Keep all follow-up visits as told by your doctor. This is important. Contact a doctor if:  A cut bleeds and leaves more than just a small spot of blood.  A cut is red, puffs up (swells), or hurts more than before.  Fluid or something else comes from a cut.  A cut smells bad.  You have a fever or chills. Get help right away if:  You have swelling, bloating, or pain in your belly (abdomen).  You get dizzy or faint.  You have a rash.  You feel sick to your stomach (nauseous) or throw up (vomit).  You have trouble breathing, feel short of breath, or feel faint.  Your chest hurts.  You have problems talking or seeing.  You have trouble with your balance or moving your  arms or legs. Summary  After the procedure, it is common to have pain, soreness, bruising, and tiredness.  Your doctor will tell you how to take care of yourself at home. Change your bandage, take your medicines, and limit your activities as told by your doctor.  Call your doctor if you have symptoms of infection. Get help right away if your belly swells, your cut bleeds a lot, or you have trouble talking or breathing. This information is not intended to replace advice given to you by your health care provider. Make sure you discuss any questions you have with your health care provider. Document Revised: 02/25/2017 Document Reviewed: 02/25/2017 Elsevier Patient Education  [place].   Moderate Conscious Sedation, Adult, Care After These instructions provide you with information about caring for yourself after your procedure. Your health care provider may also give you more specific instructions. Your treatment has been planned according to current medical practices, but problems sometimes occur. Call your health care provider if you have any problems or questions after your procedure. What can I expect after the procedure? After your procedure, it is common: To feel sleepy for several hours. To feel clumsy and have poor balance for several hours. To have poor judgment for several hours. To vomit if you eat too soon. Follow these instructions at home: For at least 24 hours after the procedure:  Do not: Participate in activities where you could fall or become injured. Drive. Use heavy machinery. Drink alcohol. Take sleeping pills or medicines that cause drowsiness. Make important decisions or sign legal documents. Take care of children on your own. Rest. Eating and drinking Follow the diet recommended by your health care provider. If you vomit: Drink water, juice, or soup when you can drink without vomiting. Make sure you have little or no nausea before eating solid  foods. General instructions Have a responsible adult stay with you until you are awake and alert. Take over-the-counter and prescription medicines only as told by your health care provider. If you smoke, do not smoke without supervision. Keep all follow-up visits as told by your health care provider. This is important. Contact a health care provider if: You keep feeling nauseous or you keep vomiting. You feel light-headed. You develop a rash. You have a fever. Get help right away if: You have trouble breathing. This information is not intended to replace advice given to you by your health care provider. Make sure you discuss any questions you have with your health care provider. Document Revised: 01/28/2017 Document Reviewed: 06/07/2015 Elsevier Patient Education  2020 [employer].

## 2019-09-20 ENCOUNTER — Ambulatory Visit (INDEPENDENT_AMBULATORY_CARE_PROVIDER_SITE_OTHER): Payer: Medicare Other | Admitting: Licensed Clinical Social Worker

## 2019-09-20 ENCOUNTER — Other Ambulatory Visit: Payer: Self-pay

## 2019-09-20 DIAGNOSIS — F4310 Post-traumatic stress disorder, unspecified: Secondary | ICD-10-CM

## 2019-09-20 DIAGNOSIS — F411 Generalized anxiety disorder: Secondary | ICD-10-CM

## 2019-09-20 DIAGNOSIS — F332 Major depressive disorder, recurrent severe without psychotic features: Secondary | ICD-10-CM | POA: Diagnosis not present

## 2019-09-20 NOTE — Progress Notes (Signed)
   THERAPIST PROGRESS NOTE  Virtual Visit via Video Note  I connected with [name] on 09/20/19 at  1:00 PM EDT by a video enabled telemedicine application and verified that I am speaking with the correct person using two identifiers.  Location: Patient: [place] Provider: Kathleen Argue    I discussed the limitations of evaluation and management by telemedicine and the availability of in person appointments. The patient expressed understanding and agreed to proceed.  Subjective/ Objective : Pt was alert and oriented x 5, He dressed casually but was well groomed. He had flat blunted affect with an anxious mood. He engaged well throughout assessment.     Pt reports that he is anxious due to a recent liver biopsy that was conducted, [name]" states "The doctors fear it is liver cancer". He reports that he will be getting the results in the next 1 to 2  days. This has caused the pt much distress and distractibility. He was provided front desk number and given permission to be forward to VM of LCSW if needed before next session due to the results.     [name] "[name]" Also endorses stressor trigger of parents. He has spent more and more times at there house as pt has become their primary caregiver. [name] knows that his parent is declining, and they are not going to get better, although they are not total care at this present time pt understands that they will need more support other than the 3 nights per weeks he spends there.  Pt has spoken with his mother about the increased care, but father is not receptive to that kind of conversation. LCSW did provide a resource number for [facility] to inquire about further resources they could provide for care in the home.   Assessment: [name] endorses symptoms for anxiety as follows irritability, anxiousness, worry "like something bad might happen", and tense. He also endorses symptoms of depression of sadness, hopelessness, and  fatigue. His anxiety during assessment was 6/10 and depression was 4/10. He continues to meet criteria for Generalized anxiety disorder and Depression.   Plan: Pt to start to journal 3 x daily thoughts and feeling towards parents, try 1 AA meeting monthly to help mentor (Pt sober 20+ years), take all medications as prescribed     I discussed the assessment and treatment plan with the patient. The patient was provided an opportunity to ask questions and all were answered. The patient agreed with the plan and demonstrated an understanding of the instructions.   The patient was advised to call back or seek an in-person evaluation if the symptoms worsen or if the condition fails to improve as anticipated.  I provided 55 minutes of non-face-to-face time during this encounter.   Dory Horn, LCSW   Participation Level: Active  Behavioral Response: Casual and Fairly GroomedAlertDysphoric  Type of Therapy: Individual Therapy  Treatment Goals addressed: Anxiety, Coping and Diagnosis: Depression   Interventions: CBT and Supportive  Summary: [name] is a 54 y.o. male who presents with MDD and GAD.   Suicidal/Homicidal: NAwithout intent/plan  Plan: Return again in 4 weeks.  Diagnosis: Axis I: Generalized Anxiety Disorder, Major Depression, Recurrent severe and Post Traumatic Stress Disorder    Dory Horn, LCSW 09/20/2019

## 2019-09-25 ENCOUNTER — Other Ambulatory Visit: Payer: Self-pay | Admitting: Internal Medicine

## 2019-09-25 LAB — SURGICAL PATHOLOGY

## 2019-10-01 DIAGNOSIS — M9903 Segmental and somatic dysfunction of lumbar region: Secondary | ICD-10-CM | POA: Diagnosis not present

## 2019-10-05 ENCOUNTER — Other Ambulatory Visit: Payer: Self-pay | Admitting: Internal Medicine

## 2019-10-05 MED ORDER — HYDROCODONE-ACETAMINOPHEN 5-325 MG PO TABS: 1.0000 | ORAL_TABLET | ORAL | 0 refills | Status: DC | PRN

## 2019-10-15 DIAGNOSIS — M9903 Segmental and somatic dysfunction of lumbar region: Secondary | ICD-10-CM | POA: Diagnosis not present

## 2019-10-17 DIAGNOSIS — Z23 Encounter for immunization: Secondary | ICD-10-CM | POA: Diagnosis not present

## 2019-10-18 ENCOUNTER — Ambulatory Visit (INDEPENDENT_AMBULATORY_CARE_PROVIDER_SITE_OTHER): Payer: Medicare Other | Admitting: Licensed Clinical Social Worker

## 2019-10-18 ENCOUNTER — Other Ambulatory Visit: Payer: Self-pay

## 2019-10-18 DIAGNOSIS — F411 Generalized anxiety disorder: Secondary | ICD-10-CM

## 2019-10-18 DIAGNOSIS — F332 Major depressive disorder, recurrent severe without psychotic features: Secondary | ICD-10-CM
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
## 2019-10-29 DIAGNOSIS — M9903 Segmental and somatic dysfunction of lumbar region: Secondary | ICD-10-CM | POA: Diagnosis not present

## 2019-10-31 ENCOUNTER — Other Ambulatory Visit: Payer: Self-pay | Admitting: Internal Medicine

## 2019-10-31 DIAGNOSIS — D500 Iron deficiency anemia secondary to blood loss (chronic): Secondary | ICD-10-CM

## 2019-10-31 DIAGNOSIS — K754 Autoimmune hepatitis: Secondary | ICD-10-CM

## 2019-10-31 DIAGNOSIS — K5090 Crohn's disease, unspecified, without complications: Secondary | ICD-10-CM | POA: Diagnosis not present

## 2019-11-01 ENCOUNTER — Other Ambulatory Visit (INDEPENDENT_AMBULATORY_CARE_PROVIDER_SITE_OTHER): Payer: Medicare Other

## 2019-11-01 DIAGNOSIS — D500 Iron deficiency anemia secondary to blood loss (chronic): Secondary | ICD-10-CM | POA: Diagnosis not present

## 2019-11-01 DIAGNOSIS — K754 Autoimmune hepatitis: Secondary | ICD-10-CM

## 2019-11-01 LAB — CBC
HCT: 42.7 % (ref 39.0–52.0)
Hemoglobin: 14.2 g/dL (ref 13.0–17.0)
MCHC: 33.3 g/dL (ref 30.0–36.0)
MCV: 94.8 fl (ref 78.0–100.0)
Platelets: 139 10*3/uL — ABNORMAL LOW (ref 150.0–400.0)
RBC: 4.5 Mil/uL (ref 4.22–5.81)
RDW: 14.7 % (ref 11.5–15.5)
WBC: 12.1 10*3/uL — ABNORMAL HIGH (ref 4.0–10.5)

## 2019-11-01 LAB — COMPREHENSIVE METABOLIC PANEL
ALT: 169 U/L — ABNORMAL HIGH (ref 0–53)
AST: 71 U/L — ABNORMAL HIGH (ref 0–37)
Albumin: 3.5 g/dL (ref 3.5–5.2)
Alkaline Phosphatase: 147 U/L — ABNORMAL HIGH (ref 39–117)
BUN: 23 mg/dL (ref 6–23)
CO2: 32 mEq/L (ref 19–32)
Calcium: 8.9 mg/dL (ref 8.4–10.5)
Chloride: 104 mEq/L (ref 96–112)
Creatinine, Ser: 1.05 mg/dL (ref 0.40–1.50)
GFR: 72.94 mL/min (ref >60.00)
Glucose, Bld: 128 mg/dL — ABNORMAL HIGH (ref 70–99)
Potassium: 4.5 mEq/L (ref 3.5–5.1)
Sodium: 142 mEq/L (ref 135–145)
Total Bilirubin: 0.4 mg/dL (ref 0.2–1.2)
Total Protein: 5.8 g/dL — ABNORMAL LOW (ref 6.0–8.3)

## 2019-11-01 LAB — FERRITIN: Ferritin: 68.4 ng/mL (ref 22.0–322.0)

## 2019-11-02 ENCOUNTER — Other Ambulatory Visit: Payer: Self-pay | Admitting: Internal Medicine

## 2019-11-02 MED ORDER — HYDROCODONE-ACETAMINOPHEN 5-325 MG PO TABS: 1.0000 | ORAL_TABLET | ORAL | 0 refills | Status: DC | PRN

## 2019-11-07 ENCOUNTER — Other Ambulatory Visit: Payer: Self-pay

## 2019-11-07 DIAGNOSIS — D500 Iron deficiency anemia secondary to blood loss (chronic): Secondary | ICD-10-CM

## 2019-11-07 MED ORDER — INJECTAFER 750 MG/15ML IV SOLN: 750.0000 mg | Freq: Once | INTRAVENOUS | 0 refills | Status: AC

## 2019-11-08 ENCOUNTER — Other Ambulatory Visit: Payer: Self-pay

## 2019-11-08 ENCOUNTER — Ambulatory Visit (INDEPENDENT_AMBULATORY_CARE_PROVIDER_SITE_OTHER): Payer: Medicare Other | Admitting: Licensed Clinical Social Worker

## 2019-11-08 DIAGNOSIS — F4310 Post-traumatic stress disorder, unspecified: Secondary | ICD-10-CM

## 2019-11-08 DIAGNOSIS — F332 Major depressive disorder, recurrent severe without psychotic features: Secondary | ICD-10-CM

## 2019-11-08 DIAGNOSIS — F411 Generalized anxiety disorder: Secondary | ICD-10-CM

## 2019-11-08 NOTE — Progress Notes (Signed)
   THERAPIST PROGRESS NOTE   Virtual Visit via Video Note  I connected with [name] on 11/08/19 at 11:00 AM EDT by a video enabled telemedicine application and verified that I am speaking with the correct person using two identifiers.  Location: Patient: [name]  Provider: [name]    I discussed the limitations of evaluation and management by telemedicine and the availability of in person appointments. The patient expressed understanding and agreed to proceed.   Therapist Response:   Subjective/objective: Pt was alert and oriented x 5. He was dressed casually and engaged well throughout session as evidence by note below. Pt had flat, anxious, and depressed mood/affect.   [name] reports primary stressors have been physical illness and family conflict. He reports that an abscess that on his "bottom" has start to become infected again. He reports that it seemed to be getting better for a while but now it is infected. He needs to follow up with his MD for antibiotics needed. This has caused stress, tension and anxiety. These symptoms are also present during his family conflict as well. Pt reported last session (3 weeks ago) things had been going well with his communication with his daughter. He had even bought a book for her to read with along with pt as she recovered from a achilleas injury. [name] states that his last contact with his daughter was 10 days ago. This has caused along with anxiety symptoms listed above irritability.   Pt has sent his daughter two emails the past 10 days with no reply. Pt will now send her a text message if she does not respond within 24 hours, he will follow that up with a phone call. [name] states that he his relationship with his parents remain the same. It still has tension at times, but he pt has been doing coping exercises provided for him mediation, yoga, journaling, and reading. Pt this has helped him managed his increase in  anxiety.   Assessment/plan: Pt endorses symptoms for anxiety for tension, worry, irritability, and insomnia. He has been taking medications as prescribed. Currently pt meets criteria for GAD. Plan moving forward for therapy will be increase journaling from 2 x weekly to 3 x weekly. When Journaling pt will write down 3-5 positive adjectives about himself. Pt is also agreeable to attempt mediation and yoga each 1 x weekly. [name] will f/u with LCSW in 3 weeks.    I discussed the assessment and treatment plan with the patient. The patient was provided an opportunity to ask questions and all were answered. The patient agreed with the plan and demonstrated an understanding of the instructions.   The patient was advised to call back or seek an in-person evaluation if the symptoms worsen or if the condition fails to improve as anticipated.  I provided 60 minutes of non-face-to-face time during this encounter.   Dory Horn, LCSW   Participation Level: Active  Behavioral Response: Casual and Fairly GroomedAlertDepressed and falt   Type of Therapy: Individual Therapy  Treatment Goals addressed: Anxiety  Interventions: CBT and Supportive  Summary: [name] is a 54 y.o. male who presents with MDD .   Suicidal/Homicidal: Nowithout intent/plan   Plan: Return again in 2 weeks.    Dory Horn, LCSW 11/08/2019

## 2019-11-12 ENCOUNTER — Other Ambulatory Visit: Payer: Self-pay

## 2019-11-12 DIAGNOSIS — M9903 Segmental and somatic dysfunction of lumbar region: Secondary | ICD-10-CM | POA: Diagnosis not present

## 2019-11-12 MED ORDER — DILTIAZEM GEL 2 %: CUTANEOUS | 0 refills | Status: DC

## 2019-11-14 ENCOUNTER — Telehealth (HOSPITAL_COMMUNITY): Payer: Medicare Other

## 2019-11-20 ENCOUNTER — Telehealth (HOSPITAL_COMMUNITY): Payer: Medicare Other

## 2019-11-21 ENCOUNTER — Telehealth (HOSPITAL_COMMUNITY): Payer: Medicare Other

## 2019-11-23 DIAGNOSIS — D500 Iron deficiency anemia secondary to blood loss (chronic): Secondary | ICD-10-CM | POA: Diagnosis not present

## 2019-11-26 ENCOUNTER — Other Ambulatory Visit: Payer: Self-pay

## 2019-11-26 DIAGNOSIS — M9903 Segmental and somatic dysfunction of lumbar region: Secondary | ICD-10-CM | POA: Diagnosis not present

## 2019-11-29 ENCOUNTER — Other Ambulatory Visit: Payer: Self-pay

## 2019-11-29 ENCOUNTER — Ambulatory Visit (INDEPENDENT_AMBULATORY_CARE_PROVIDER_SITE_OTHER): Payer: Medicare Other | Admitting: Licensed Clinical Social Worker

## 2019-11-29 DIAGNOSIS — F320 Major depressive disorder, single episode, mild: Secondary | ICD-10-CM

## 2019-11-29 DIAGNOSIS — F332 Major depressive disorder, recurrent severe without psychotic features: Secondary | ICD-10-CM | POA: Diagnosis not present

## 2019-11-29 NOTE — Progress Notes (Signed)
   THERAPIST PROGRESS NOTE  Virtual Visit via Video Note  I connected with [name] on 11/29/19 at 11:00 AM EDT by a video enabled telemedicine application and verified that I am speaking with the correct person using two identifiers.  Location: Patient: [name]  Provider: [facility]    I discussed the limitations of evaluation and management by telemedicine and the availability of in person appointments. The patient expressed understanding and agreed to proceed.   Therapist Response:   Subjective/Objective: Pt was alert and oriented x 5. He was dressed casually and engaged well throughout therapy session with flat and anxious mood/affect.   Pt reports primary stressor as family conflict. He states that his parents have become more and more dependent on him and it is starting to make him resentful towards them. He knows that they need more help in the house, but they are unwilling to accept people from the outside coming in. Tico reports that he wants to present this to him with "I" statements so that it sounds more like he needs the help than they do. LCSW provided assignment for pt to journal down way to present this to them so when the time is right, he can articulate his thoughts in a proper structure.     Assessment/Plan: Pt endorse symptoms for depression and anxiety for irritability, tension, worthlessness, hopelessness, and sadness. He currently meets criteria for GAD and MDD. He has been taking medications as prescribed. Plan moving forward write down three things he is grateful for and 3 positive things about himself. Journal his thoughts and feeling about his presenting problems. Continue to engage in yoga and meditation.     I discussed the assessment and treatment plan with the patient. The patient was provided an opportunity to ask questions and all were answered. The patient agreed with the plan and demonstrated an understanding of the  instructions.   The patient was advised to call back or seek an in-person evaluation if the symptoms worsen or if the condition fails to improve as anticipated.  I provided 45 minutes of non-face-to-face time during this encounter.   Dory Horn, LCSW  Participation Level: Active  Behavioral Response: CasualAlertAnxious and Depressed  Type of Therapy: Individual Therapy  Treatment Goals addressed: Diagnosis: GAD and MDD   Interventions: CBT  Summary: [name] is a 54 y.o. male who presents with MDD & GAD.   Suicidal/Homicidal: Nowithout intent/plan   Plan: Return again in 3 weeks.    Dory Horn, LCSW 11/29/2019

## 2019-11-30 DIAGNOSIS — D509 Iron deficiency anemia, unspecified: Secondary | ICD-10-CM | POA: Diagnosis not present

## 2019-12-04 ENCOUNTER — Other Ambulatory Visit: Payer: Self-pay | Admitting: Internal Medicine

## 2019-12-04 MED ORDER — HYDROCODONE-ACETAMINOPHEN 5-325 MG PO TABS: 1.0000 | ORAL_TABLET | ORAL | 0 refills | Status: DC | PRN

## 2019-12-05 DIAGNOSIS — Z23 Encounter for immunization: Secondary | ICD-10-CM | POA: Diagnosis not present

## 2019-12-06 ENCOUNTER — Other Ambulatory Visit: Payer: Self-pay

## 2019-12-06 ENCOUNTER — Encounter (HOSPITAL_COMMUNITY): Payer: Self-pay | Admitting: Psychiatry

## 2019-12-06 ENCOUNTER — Telehealth (INDEPENDENT_AMBULATORY_CARE_PROVIDER_SITE_OTHER): Payer: Medicare Other | Admitting: Psychiatry

## 2019-12-06 DIAGNOSIS — F332 Major depressive disorder, recurrent severe without psychotic features: Secondary | ICD-10-CM

## 2019-12-06 MED ORDER — TRAZODONE HCL 50 MG PO TABS: 50.0000 mg | ORAL_TABLET | Freq: Every day | ORAL | 2 refills | Status: DC

## 2019-12-06 NOTE — Progress Notes (Signed)
[facility] MD/PA/NP OP Progress Note Virtual Visit via Video Note  I connected with [name] on 12/06/19 at  9:00 AM EDT by a video enabled telemedicine application and verified that I am speaking with the correct person using two identifiers.  Location: Patient: Home Provider: Clinic   I discussed the limitations of evaluation and management by telemedicine and the availability of in person appointments. The patient expressed understanding and agreed to proceed.  I provided 30 minutes of non-face-to-face time during this encounter.     12/06/2019 9:15 AM [name]  MRN:  [id number]  Chief Complaint:    HPI: [name] is a 54 year old male who presents for follow-up psychiatric evaluation. He has a past medical history of PTSD, GAD, and MDD and is currently managed on Trazodone 51m at night, Gabapentin 6064mat night [prescribed by his gastroenterologist], Melatonin 46m30mt night which he purchases off [employer], and Klonopin 0.5mg246m twice daily [prescribed by his primary care]. This encounter was conducted over telephone due to the patient not being able to access the video conference.   On evaluation today, patient is pleasant and cooperative. His speech is a normal rate and volume and he is engaged appropriately in conversation. Patient states he is doing well overall on his current medications.  He rates his anxiety and depression a 4-5/10 [with 10 being most severe]. He does not wish to add additional medications at this time and states he is trying to continue to work through his symptoms. He notes that he sleeps better than he ever has. He denies AVH/SI/HI or poor appetite.  He agrees to continue on his current medications without any changes. Patient is agreeable to continue seeing outpatient therapy and follow-up with outpatient psychiatry in 3 months.  Visit Diagnosis:    ICD-10-CM   1. MDD (major depressive disorder), recurrent severe, without psychosis ([name])[place] traZODone (DESYREL) 50 MG tablet    Past Psychiatric History: PTSD, GAD, and MDD   Past Medical History:  Past Medical History:  Diagnosis Date  . Allergy   . Anal fistula   . Anxiety   . 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 Autoimmune hepatitis ([name])[place]/21/2014   liver function checked every 2 or 3 months sees dr gessCarlean PurlAvoidant-restrictive food intake disorder (ARFID) ? 06/15/2018  . Cancer of skin of neck   . Cataract   . Chronic mesenteric ischemia ([name])[place]. Chronic pain syndrome 07/09/2016  . Crohn's disease of small and large intestines ([name])[place] followed by dr carlGlendell Dockersner  . Dairy product intolerance   . Diarrhea, functional   . Family history of adverse reaction to anesthesia   . Grover's disease    transient acantholytic dermatosis  . History of alcohol abuse   . History of basal cell carcinoma excision    2013 left leg  . History of Clostridium difficile    10/ 2014  . History of multiple concussions    x6   last one Jan 2017 per pt--  no residual  . History of substance abuse ([name])[place] quit 1997 per pt  . History of suicide attempt    05-18-2012  overdose /  failure to thrive  . Hypercholesterolemia   . Iron deficiency anemia due to chronic blood loss   . Major depression, recurrent, chronic ([name])[place]. Osteopenia   . Personal history of adenomatous colonic polyps 12/2010, 03/2012  12/2010 - 8 mm serrated adenoma of rectum  . Portal vein thrombosis 03/21/2015   right  . Primary sclerosing cholangitis    ? hepatitis overlap - liver bx x 2 and MRCP  . Seasonal allergies   . Substance abuse ([place]) 1997   Alcohol  . Vitamin A deficiency 05/23/2018  . Vitamin B6 deficiency 07/27/2018  . Vitamin C deficiency 05/23/2018    Past Surgical History:  Procedure Laterality Date  . ABDOMINAL AORTAGRAM N/A 03/29/2012   Procedure: ABDOMINAL Maxcine Ham;  Surgeon: Serafina Mitchell, MD;  Location[facility] CATH LAB;  Service: Cardiovascular;  Laterality: N/A;  .  ADENOIDECTOMY  age 24  . BIOPSY  05/31/2018   Procedure: BIOPSY;  Surgeon: Milus Banister, MD;  Location: [facility];  Service: Endoscopy;;  . CATARACT EXTRACTION W/ INTRAOCULAR LENS  IMPLANT, BILATERAL  2009  . COLONOSCOPY  2001, 05/02/2003, 01/28/11   2012: Right colon Crohn's, rectal polyp  . COLONOSCOPY  03/31/2012   Procedure: COLONOSCOPY;  Surgeon: Jerene Bears, MD;  Location: [place];  Service: Gastroenterology;  Laterality: N/A;  . COLONOSCOPY WITH PROPOFOL N/A 05/31/2018   Procedure: COLONOSCOPY WITH PROPOFOL;  Surgeon: Milus Banister, MD;  Location: [facility];  Service: Endoscopy;  Laterality: N/A;  . ESOPHAGOGASTRODUODENOSCOPY  01/28/11   Normal  . FOOT SURGERY Right age 40  . MOHS SURGERY Left 11/2013   left ankle parakerotosis   . PERCUTANEOUS LIVER BIOPSY  2007 and 2008  . PILONIDAL CYST EXCISION  age 89  . PLACEMENT OF SETON N/A 11/06/2015   Procedure: PLACEMENT OF SETON;  Surgeon: [name], MD;  Location: [facility];  Service: General;  Laterality: N/A;  . PLACEMENT OF SETON  07/2018   at [facility]  . RECTAL EXAM UNDER ANESTHESIA N/A 01/18/2019   Procedure: ANAL EXAM UNDER ANESTHESIA,  INCISION AND DRAINAGE, SETON PLACEMENT;  Surgeon: [name], MD;  Location: [place];  Service: General;  Laterality: N/A;  . UPPER GASTROINTESTINAL ENDOSCOPY      Family Psychiatric History: Mother depression and anxiety and father depress Family History:  Family History  Problem Relation Age of Onset  . Heart disease Father   . Thyroid cancer Father   . Allergies Father   . Clotting disorder Father   . Breast cancer Mother   . Stomach cancer Mother   . Colon cancer Neg Hx   . Esophageal cancer Neg Hx   . Rectal cancer Neg Hx     Social History:  Social History   Socioeconomic History  . Marital status: Single    Spouse name: Not on file  . Number of children: 1  . Years of education: Not on file  . Highest education level:  Not on file  Occupational History  . Occupation: Disability   Tobacco Use  . Smoking status: Former Smoker    Packs/day: 1.00    Years: 15.00    Pack years: 15.00    Types: Cigarettes    Quit date: 03/28/1997    Years since quitting: 22.7  . Smokeless tobacco: Never Used  Vaping Use  . Vaping Use: Never used  Substance and Sexual Activity  . Alcohol use: No    Alcohol/week: 0.0 standard drinks  . Drug use: Not Currently    Types: Cocaine, Marijuana    Comment: QUIT USING DRUGS IN 1997  . Sexual activity: Not Currently  Other Topics Concern  . Not on file  Social History Narrative   Single,  history of substance abuse in recovery   Previously occupied on medical disability   1 child  Daughter Theme park manager, lives and works in [place]   Elderly parents involved and he helps them   1 brother in [place]   Social Determinants of Health   Financial Resource Strain: [place]   . Difficulty of Paying Living Expenses: Not hard at all  Food Insecurity: No Food Insecurity  . Worried About Charity fundraiser in the Last Year: Never true  . Ran Out of Food in the Last Year: Never true  Transportation Needs: No Transportation Needs  . Lack of Transportation (Medical): No  . Lack of Transportation (Non-Medical): No  Physical Activity: Insufficiently Active  . Days of Exercise per Week: 3 days  . Minutes of Exercise per Session: 30 min  Stress: Stress Concern Present  . Feeling of Stress : Very much  Social Connections: Socially Isolated  . Frequency of Communication with Friends and Family: Three times a week  . Frequency of Social Gatherings with Friends and Family: Twice a week  . Attends Religious Services: Never  . Active Member of Clubs or Organizations: No  . Attends Archivist Meetings: Never  . Marital Status: Divorced    Allergies:  Allergies  Allergen Reactions  . Asacol [Mesalamine] Diarrhea    abd pain  . Flagyl [Metronidazole] Diarrhea     abd pain  . Amoxicillin Other (See Comments)    Lots of gas  . Azathioprine Diarrhea    abd pain  . Gluten Meal Diarrhea  . Metoprolol Tartrate     Stomach upset  . Mycophenolate Mofetil Diarrhea    abd pain  . Other     NUTS; constipation  . Tylenol [Acetaminophen] Other (See Comments)  . Wheat Bran Diarrhea    Constipation; flatulence; abd pain  . Tape Itching and Rash    Please use "paper" tape    Metabolic Disorder Labs: Lab Results  Component Value Date   HGBA1C 6.1 09/18/2014   No results found for: PROLACTIN Lab Results  Component Value Date   CHOL 288 (H) 02/05/2011   TRIG 76.0 02/05/2011   HDL 62.50 02/05/2011   CHOLHDL 5 02/05/2011   VLDL 15.2 02/05/2011   Lab Results  Component Value Date   TSH 2.053 07/21/2018   TSH 4.496 05/28/2018    Therapeutic Level Labs: No results found for: LITHIUM No results found for: VALPROATE No components found for:  CBMZ  Current Medications: Current Outpatient Medications  Medication Sig Dispense Refill  . Ascorbic Acid (VITAMIN C) 1000 MG tablet Take 1,000 mg by mouth daily.    . Cholecalciferol (VITAMIN D) 125 MCG (5000 UT) CAPS Take 5,000 Units by mouth daily.    . clonazePAM (KLONOPIN) 1 MG tablet Take 1 mg by mouth 2 (two) times daily. Takes 1/2 tab (0.37m)    . diltiazem 2 % GEL Apply a pea size amount into rectum 2 times a day 30 g 0  . gabapentin (NEURONTIN) 600 MG tablet TAKE 1 TABLET BY MOUTH AT BEDTIME 90 tablet 1  . HYDROcodone-acetaminophen (NORCO/VICODIN) 5-325 MG tablet Take 1 tablet by mouth every 4 (four) hours as needed for moderate pain or severe pain. 30 tablet 0  . Melatonin 3 MG CAPS Take 3 mg by mouth at bedtime as needed.    . predniSONE (DELTASONE) 20 MG tablet TAKE 1 AND 1/2 TABLETS (30 MG TOTAL) BY MOUTH DAILY WITH BREAKFAST. 135 tablet 1  .  traZODone (DESYREL) 50 MG tablet Take 1 tablet (50 mg total) by mouth at bedtime. 30 tablet 2  . vedolizumab (ENTYVIO) 300 MG injection Entyvio 300 mg IV  on week 0, 2, 6 then q 8 weeks (Patient taking differently: 300 mg. Entyvio 300 mg IV q 8 weeks) 1 each 9   No current facility-administered medications for this visit.     Musculoskeletal: Strength & Muscle Tone: Unable to assess due to telephone visit  Gait & Station: Unable to assess due to telephone visit  Patient leans: N/A  Psychiatric Specialty Exam: Review of Systems  There were no vitals taken for this visit.There is no height or weight on file to calculate BMI.  General Appearance: Unable to assess due to telephone visit   Eye Contact:  Unable to assess due to telephone visit   Speech:  Clear and Coherent and Normal Rate  Volume:  Normal  Mood:  Euthymic  Affect:  Appropriate and Congruent  Thought Process:  Coherent, Goal Directed and Linear  Orientation:  Full (Time, Place, and Person)  Thought Content: WDL and Logical   Suicidal Thoughts:  No  Homicidal Thoughts:  No  Memory:  Immediate;   Good Recent;   Good Remote;   Good  Judgement:  Good  Insight:  Good  Psychomotor Activity:  Normal  Concentration:  Concentration: Good and Attention Span: Good  Recall:  Good  Fund of Knowledge: Good  Language: Good  Akathisia:  No  Handed:  Left  AIMS (if indicated): Not done  Assets:  Communication Skills Desire for Improvement Financial Resources/Insurance Housing Social Support  ADL's:  Intact  Cognition: WNL  Sleep:  Good   Screenings: GAD-7     Counselor from 11/08/2019 in [facility] Counselor from 08/28/2019 in [facility]  Total GAD-7 Score 17 14    PHQ2-9     Counselor from 08/28/2019 in [facility] Office Visit from 11/02/2018 in [facility] Patient Outreach Telephone from 08/23/2018 in [place] Patient Outreach Telephone from 07/28/2018 in [place] from [facility]/02/2013 in [facility]  PHQ-2 Total  Score 1 0 3 6 0  PHQ-9 Total Score 11 4 6 24  --       Assessment and Plan: Patient notes that he experiences occasional depression and anxiety.  He however notes that he is able to cope with his anxiety and depression and notes that he does not want medications adjusted at this time.  He will continue all medications as prescribed.  1. MDD (major depressive disorder), recurrent severe, without psychosis ([place])  Continue- traZODone (DESYREL) 50 MG tablet; Take 1 tablet (50 mg total) by mouth at bedtime.  Dispense: 30 tablet; Refill: 2  Follow-up in 3 months Follow-up with therapy  Salley Slaughter, NP 12/06/2019, 9:15 AM

## 2019-12-12 DIAGNOSIS — M9903 Segmental and somatic dysfunction of lumbar region: Secondary | ICD-10-CM | POA: Diagnosis not present

## 2019-12-19 ENCOUNTER — Other Ambulatory Visit: Payer: Self-pay | Admitting: Internal Medicine

## 2019-12-19 DIAGNOSIS — K50818 Crohn's disease of both small and large intestine with other complication: Secondary | ICD-10-CM

## 2019-12-19 DIAGNOSIS — Z79899 Other long term (current) drug therapy: Secondary | ICD-10-CM

## 2019-12-19 DIAGNOSIS — Z7960 Long term (current) use of unspecified immunomodulators and immunosuppressants: Secondary | ICD-10-CM

## 2019-12-19 NOTE — Progress Notes (Signed)
[name],  I am feeling better.  Got cleared to drive today which is a big step.  Please come to our lab on October 26 and have Entyvio levels and antibodies checked.  It will take about a week for that to come back but we need to see those results prior to making any dosing interval change.   Take care,  CEG

## 2019-12-20 ENCOUNTER — Other Ambulatory Visit: Payer: Self-pay

## 2019-12-20 ENCOUNTER — Ambulatory Visit (INDEPENDENT_AMBULATORY_CARE_PROVIDER_SITE_OTHER): Payer: Medicare Other | Admitting: Licensed Clinical Social Worker

## 2019-12-20 DIAGNOSIS — F332 Major depressive disorder, recurrent severe without psychotic features: Secondary | ICD-10-CM
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
## 2019-12-24 ENCOUNTER — Other Ambulatory Visit: Payer: Self-pay | Admitting: Internal Medicine

## 2019-12-24 DIAGNOSIS — L821 Other seborrheic keratosis: Secondary | ICD-10-CM | POA: Diagnosis not present

## 2019-12-24 DIAGNOSIS — L570 Actinic keratosis: Secondary | ICD-10-CM | POA: Diagnosis not present

## 2019-12-24 DIAGNOSIS — C44712 Basal cell carcinoma of skin of right lower limb, including hip: Secondary | ICD-10-CM | POA: Diagnosis not present

## 2019-12-24 DIAGNOSIS — L814 Other melanin hyperpigmentation: Secondary | ICD-10-CM | POA: Diagnosis not present

## 2019-12-24 DIAGNOSIS — C44612 Basal cell carcinoma of skin of right upper limb, including shoulder: Secondary | ICD-10-CM | POA: Diagnosis not present

## 2019-12-24 DIAGNOSIS — D500 Iron deficiency anemia secondary to blood loss (chronic): Secondary | ICD-10-CM

## 2019-12-24 DIAGNOSIS — K754 Autoimmune hepatitis: Secondary | ICD-10-CM

## 2019-12-24 DIAGNOSIS — L565 Disseminated superficial actinic porokeratosis (DSAP): Secondary | ICD-10-CM | POA: Diagnosis not present

## 2019-12-24 DIAGNOSIS — Z85828 Personal history of other malignant neoplasm of skin: Secondary | ICD-10-CM | POA: Diagnosis not present

## 2019-12-24 DIAGNOSIS — C44619 Basal cell carcinoma of skin of left upper limb, including shoulder: Secondary | ICD-10-CM | POA: Diagnosis not present

## 2019-12-25 ENCOUNTER — Other Ambulatory Visit (INDEPENDENT_AMBULATORY_CARE_PROVIDER_SITE_OTHER): Payer: Medicare Other

## 2019-12-25 DIAGNOSIS — D500 Iron deficiency anemia secondary to blood loss (chronic): Secondary | ICD-10-CM | POA: Diagnosis not present

## 2019-12-25 DIAGNOSIS — Z79899 Other long term (current) drug therapy: Secondary | ICD-10-CM | POA: Diagnosis not present

## 2019-12-25 DIAGNOSIS — K50818 Crohn's disease of both small and large intestine with other complication: Secondary | ICD-10-CM | POA: Diagnosis not present

## 2019-12-25 DIAGNOSIS — K754 Autoimmune hepatitis: Secondary | ICD-10-CM | POA: Diagnosis not present

## 2019-12-25 DIAGNOSIS — Z7960 Long term (current) use of unspecified immunomodulators and immunosuppressants: Secondary | ICD-10-CM

## 2019-12-25 LAB — CBC
HCT: 40.3 % (ref 39.0–52.0)
Hemoglobin: 13.5 g/dL (ref 13.0–17.0)
MCHC: 33.6 g/dL (ref 30.0–36.0)
MCV: 97.1 fl (ref 78.0–100.0)
Platelets: 125 10*3/uL — ABNORMAL LOW (ref 150.0–400.0)
RBC: 4.15 Mil/uL — ABNORMAL LOW (ref 4.22–5.81)
RDW: 16.6 % — ABNORMAL HIGH (ref 11.5–15.5)
WBC: 9.5 10*3/uL (ref 4.0–10.5)

## 2019-12-25 LAB — COMPREHENSIVE METABOLIC PANEL
ALT: 142 U/L — ABNORMAL HIGH (ref 0–53)
AST: 57 U/L — ABNORMAL HIGH (ref 0–37)
Albumin: 3.3 g/dL — ABNORMAL LOW (ref 3.5–5.2)
Alkaline Phosphatase: 151 U/L — ABNORMAL HIGH (ref 39–117)
BUN: 20 mg/dL (ref 6–23)
CO2: 27 mEq/L (ref 19–32)
Calcium: 7.6 mg/dL — ABNORMAL LOW (ref 8.4–10.5)
Chloride: 106 mEq/L (ref 96–112)
Creatinine, Ser: 0.91 mg/dL (ref 0.40–1.50)
GFR: 94.14 mL/min (ref >60.00)
Glucose, Bld: 128 mg/dL — ABNORMAL HIGH (ref 70–99)
Potassium: 3.7 mEq/L (ref 3.5–5.1)
Sodium: 141 mEq/L (ref 135–145)
Total Bilirubin: 0.4 mg/dL (ref 0.2–1.2)
Total Protein: 5.1 g/dL — ABNORMAL LOW (ref 6.0–8.3)

## 2019-12-25 LAB — FERRITIN: Ferritin: 482.3 ng/mL — ABNORMAL HIGH (ref 22.0–322.0)

## 2019-12-26 DIAGNOSIS — K5090 Crohn's disease, unspecified, without complications: Secondary | ICD-10-CM | POA: Diagnosis not present

## 2020-01-02 DIAGNOSIS — M9903 Segmental and somatic dysfunction of lumbar region: Secondary | ICD-10-CM | POA: Diagnosis not present

## 2020-01-04 ENCOUNTER — Other Ambulatory Visit: Payer: Self-pay | Admitting: Internal Medicine

## 2020-01-04 MED ORDER — HYDROCODONE-ACETAMINOPHEN 5-325 MG PO TABS: 1.0000 | ORAL_TABLET | ORAL | 0 refills | Status: DC | PRN

## 2020-01-05 LAB — SERIAL MONITORING

## 2020-01-06 LAB — VEDOLIZUMAB AND ANTI-VEDO AB
Anti-Vedolizumab Antibody: <25 ng/mL
Vedolizumab: 9.8 ug/mL

## 2020-01-07 ENCOUNTER — Other Ambulatory Visit: Payer: Self-pay

## 2020-01-07 DIAGNOSIS — K50814 Crohn's disease of both small and large intestine with abscess: Secondary | ICD-10-CM

## 2020-01-07 MED ORDER — ENTYVIO 300 MG IV SOLR: 300.0000 mg | INTRAVENOUS | 9 refills | Status: DC

## 2020-01-08 ENCOUNTER — Other Ambulatory Visit: Payer: Self-pay

## 2020-01-08 DIAGNOSIS — K50814 Crohn's disease of both small and large intestine with abscess: Secondary | ICD-10-CM

## 2020-01-08 MED ORDER — ENTYVIO 300 MG IV SOLR: 300.0000 mg | INTRAVENOUS | 9 refills | Status: DC

## 2020-01-10 ENCOUNTER — Other Ambulatory Visit: Payer: Self-pay

## 2020-01-10 ENCOUNTER — Ambulatory Visit (INDEPENDENT_AMBULATORY_CARE_PROVIDER_SITE_OTHER): Payer: Medicare Other | Admitting: Licensed Clinical Social Worker

## 2020-01-10 DIAGNOSIS — F411 Generalized anxiety disorder: Secondary | ICD-10-CM

## 2020-01-10 DIAGNOSIS — F325 Major depressive disorder, single episode, in full remission: Secondary | ICD-10-CM
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
## 2020-01-23 DIAGNOSIS — M9903 Segmental and somatic dysfunction of lumbar region: Secondary | ICD-10-CM | POA: Diagnosis not present

## 2020-01-27 ENCOUNTER — Other Ambulatory Visit: Payer: Self-pay | Admitting: Internal Medicine

## 2020-01-28 NOTE — Telephone Encounter (Signed)
Please advise Sir, thank you. 

## 2020-01-31 ENCOUNTER — Other Ambulatory Visit: Payer: Self-pay | Admitting: Internal Medicine

## 2020-01-31 DIAGNOSIS — F1190 Opioid use, unspecified, uncomplicated: Secondary | ICD-10-CM

## 2020-01-31 DIAGNOSIS — G894 Chronic pain syndrome: Secondary | ICD-10-CM

## 2020-02-01 ENCOUNTER — Other Ambulatory Visit: Payer: Self-pay | Admitting: Internal Medicine

## 2020-02-01 ENCOUNTER — Other Ambulatory Visit: Payer: Medicare Other

## 2020-02-01 DIAGNOSIS — G894 Chronic pain syndrome: Secondary | ICD-10-CM | POA: Diagnosis not present

## 2020-02-01 MED ORDER — HYDROCODONE-ACETAMINOPHEN 5-325 MG PO TABS: 1.0000 | ORAL_TABLET | ORAL | 0 refills | Status: DC | PRN

## 2020-02-06 DIAGNOSIS — M9903 Segmental and somatic dysfunction of lumbar region: Secondary | ICD-10-CM | POA: Diagnosis not present

## 2020-02-06 LAB — DRUG SCR UR, PAIN MGMT, REFLEX CONF
Amphetamine Scrn, Ur: NEGATIVE ng/mL
BARBITURATE SCREEN URINE: NEGATIVE ng/mL
BENZODIAZEPINE SCREEN, URINE: NEGATIVE ng/mL
Cocaine (Metab) Scrn, Ur: NEGATIVE ng/mL
Creatinine(Crt), U: 25.3 mg/dL (ref 20.0–300.0)
Methadone Screen, Urine: NEGATIVE ng/mL
OXYCODONE+OXYMORPHONE UR QL SCN: NEGATIVE ng/mL
Opiate Scrn, Ur: NEGATIVE ng/mL
Ph of Urine: 5.5 (ref 4.5–8.9)
Phencyclidine Qn, Ur: NEGATIVE ng/mL
Propoxyphene Scrn, Ur: NEGATIVE ng/mL

## 2020-02-07 ENCOUNTER — Other Ambulatory Visit: Payer: Self-pay

## 2020-02-07 ENCOUNTER — Ambulatory Visit (HOSPITAL_COMMUNITY): Payer: Medicare Other | Admitting: Licensed Clinical Social Worker

## 2020-02-11 DIAGNOSIS — K5090 Crohn's disease, unspecified, without complications: Secondary | ICD-10-CM | POA: Diagnosis not present

## 2020-02-14 DIAGNOSIS — F322 Major depressive disorder, single episode, severe without psychotic features: Secondary | ICD-10-CM | POA: Diagnosis not present

## 2020-02-14 DIAGNOSIS — Z125 Encounter for screening for malignant neoplasm of prostate: Secondary | ICD-10-CM | POA: Diagnosis not present

## 2020-02-14 DIAGNOSIS — Z0000 Encounter for general adult medical examination without abnormal findings: Secondary | ICD-10-CM | POA: Diagnosis not present

## 2020-02-14 DIAGNOSIS — Z23 Encounter for immunization: Secondary | ICD-10-CM | POA: Diagnosis not present

## 2020-02-14 DIAGNOSIS — E559 Vitamin D deficiency, unspecified: Secondary | ICD-10-CM | POA: Diagnosis not present

## 2020-02-14 DIAGNOSIS — F411 Generalized anxiety disorder: Secondary | ICD-10-CM | POA: Diagnosis not present

## 2020-02-20 ENCOUNTER — Other Ambulatory Visit: Payer: Medicare Other

## 2020-02-20 DIAGNOSIS — Z20822 Contact with and (suspected) exposure to covid-19: Secondary | ICD-10-CM | POA: Diagnosis not present

## 2020-02-20 DIAGNOSIS — Z03818 Encounter for observation for suspected exposure to other biological agents ruled out: Secondary | ICD-10-CM | POA: Diagnosis not present

## 2020-02-21 ENCOUNTER — Other Ambulatory Visit: Payer: Self-pay | Admitting: Internal Medicine

## 2020-02-27 DIAGNOSIS — M9903 Segmental and somatic dysfunction of lumbar region: Secondary | ICD-10-CM | POA: Diagnosis not present

## 2020-02-28 ENCOUNTER — Other Ambulatory Visit: Payer: Self-pay | Admitting: Internal Medicine

## 2020-02-28 MED ORDER — HYDROCODONE-ACETAMINOPHEN 5-325 MG PO TABS: 1.0000 | ORAL_TABLET | ORAL | 0 refills | Status: DC | PRN

## 2020-03-05 DIAGNOSIS — K5090 Crohn's disease, unspecified, without complications: Secondary | ICD-10-CM | POA: Diagnosis not present

## 2020-03-05 DIAGNOSIS — M2550 Pain in unspecified joint: Secondary | ICD-10-CM | POA: Diagnosis not present

## 2020-03-05 DIAGNOSIS — D509 Iron deficiency anemia, unspecified: Secondary | ICD-10-CM | POA: Diagnosis not present

## 2020-03-05 DIAGNOSIS — Z79899 Other long term (current) drug therapy: Secondary | ICD-10-CM | POA: Diagnosis not present

## 2020-03-05 DIAGNOSIS — R748 Abnormal levels of other serum enzymes: Secondary | ICD-10-CM | POA: Diagnosis not present

## 2020-03-06 ENCOUNTER — Other Ambulatory Visit: Payer: Self-pay

## 2020-03-06 ENCOUNTER — Ambulatory Visit (INDEPENDENT_AMBULATORY_CARE_PROVIDER_SITE_OTHER): Payer: Medicare Other | Admitting: Licensed Clinical Social Worker

## 2020-03-06 ENCOUNTER — Telehealth: Payer: Self-pay | Admitting: Family Medicine

## 2020-03-06 ENCOUNTER — Other Ambulatory Visit: Payer: Self-pay | Admitting: Internal Medicine

## 2020-03-06 DIAGNOSIS — F411 Generalized anxiety disorder: Secondary | ICD-10-CM

## 2020-03-06 DIAGNOSIS — F332 Major depressive disorder, recurrent severe without psychotic features: Secondary | ICD-10-CM

## 2020-03-06 MED ORDER — DILTIAZEM GEL 2 %
CUTANEOUS | 0 refills | Status: DC
Start: 2020-03-06 — End: 2020-07-11

## 2020-03-06 NOTE — Telephone Encounter (Signed)
error 

## 2020-03-06 NOTE — Progress Notes (Signed)
   THERAPIST PROGRESS NOTE  Virtual Visit via Video Note  I connected with [name] on 03/06/20 at  2:00 PM EST by a video enabled telemedicine application and verified that I am speaking with the correct person using two identifiers.  Location: Patient: [facility]  Provider: [facility]    I discussed the limitations of evaluation and management by telemedicine and the availability of in person appointments. The patient expressed understanding and agreed to proceed.  Session Time: 30   Therapist Response:    Subjective/Objective: Pt was alert and oriented x 5. He was dressed casually and engaged well in f/u therapy session. Pt presented today with euthymic mood/affect. He was cooperative and maintained good eye contact.   Plan:  Pt has created 3 coping skills which has been designated as the primary goal of the Tx plan. [name] reports that he journals multiple times per week, utilizes yoga for anxiety, and reads almost daily. This has decreased his anxiety and depression as evidence by PHQ-9 and GAD-7.  Original score was 17 for GAD-7 and 22 for PHQ-9. They are now 8 for PHQ-9 and 11 for GAD-7. As treatment goals have been accomplished it has been agreed to discontinue therapy at this time.     Assessment: Pt endorse symptoms for depression and anxiety for worry, irritability, and hopelessness. He does meet criteria for GAD and MDD but states the symptoms have decreased as evidence by PHQ-9 and GAD-7 scores.    I discussed the assessment and treatment plan with the patient. The patient was provided an opportunity to ask questions and all were answered. The patient agreed with the plan and demonstrated an understanding of the instructions.   The patient was advised to call back or seek an in-person evaluation if the symptoms worsen or if the condition fails to improve as anticipated.  I provided 30 minutes of non-face-to-face time during this encounter.   Dory Horn, LCSW    Participation Level: Active  Behavioral Response: Casual and Well GroomedAlertDepressed  Type of Therapy: Individual Therapy  Treatment Goals addressed: Diagnosis: GAD & MDD   Interventions: CBT and Supportive  Summary: [name] is a 55 y.o. male who presents with MDD and GAD.   Suicidal/Homicidal: NAwithout intent/plan    Plan: Return on as needed basis for re-evaluation if needed      Dory Horn, LCSW 03/06/2020

## 2020-03-07 ENCOUNTER — Encounter (HOSPITAL_COMMUNITY): Payer: Self-pay | Admitting: Psychiatry

## 2020-03-07 ENCOUNTER — Telehealth (INDEPENDENT_AMBULATORY_CARE_PROVIDER_SITE_OTHER): Payer: Medicare Other | Admitting: Psychiatry

## 2020-03-07 ENCOUNTER — Other Ambulatory Visit: Payer: Self-pay

## 2020-03-07 DIAGNOSIS — F332 Major depressive disorder, recurrent severe without psychotic features: Secondary | ICD-10-CM | POA: Diagnosis not present

## 2020-03-07 MED ORDER — TRAZODONE HCL 50 MG PO TABS: 50.0000 mg | ORAL_TABLET | Freq: Every day | ORAL | 2 refills | Status: DC

## 2020-03-07 NOTE — Progress Notes (Signed)
[facility] [place] MD/PA/NP OP Progress Note Virtual Visit via Video Note  I connected with [name] on 03/07/20 at  9:30 AM EST by a video enabled telemedicine application and verified that I am speaking with the correct person using two identifiers.  Location: Patient: Home Provider: Clinic   I discussed the limitations of evaluation and management by telemedicine and the availability of in person appointments. The patient expressed understanding and agreed to proceed.  I provided 30 minutes of non-face-to-face time during this encounter.     03/07/2020 9:28 AM [name]  MRN:  [id number]  Chief Complaint:    HPI: 55 year old male seen today for follow-up psychiatric evaluation. He has a past medical history of PTSD, GAD, and MDD and is currently managed on Trazodone 51m at night, Gabapentin 6079mat night [prescribed by his gastroenterologist], Melatonin 63m36mt night which he purchases off [employer], and Klonopin 0.5mg60m twice daily [prescribed by his primary care]. He notes his medications are effective in managing his psychiatric condtions.   Today he is pleasant, calm, cooperative, and engaged in conversation. His speech is a normal rate and volume and he is engaged appropriately in conversation. He notes that he is doing well overall on his current medications. He notes that he has minimal anxiety and depression. A GAD 7 was conducted on 03/06/2020 and patient scored an 11. A PHQ 9 was also conducted on 03/06/2020 and patient scored an 8.  He notes that he sleeps 5-6 hours a night which he reports is good for him. He denies AVH/SI/HI or poor appetite.  No medication changes made today. Patient will continue all medications as prescribed. He notes that he and his therapist discussed taking a break form counseling since he is doing well. No other concerns noted at this time.   Visit Diagnosis:    ICD-10-CM   1. MDD (major depressive disorder), recurrent severe, without psychosis  ([name])[place]33.2 traZODone (DESYREL) 50 MG tablet    Past Psychiatric History: PTSD, GAD, and MDD   Past Medical History:  Past Medical History:  Diagnosis Date  . Allergy   . Anal fistula   . Anxiety   . 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 Autoimmune hepatitis ([name])[place]/21/2014   liver function checked every 2 or 3 months sees dr gessCarlean PurlAvoidant-restrictive food intake disorder (ARFID) ? 06/15/2018  . Cancer of skin of neck   . Cataract   . Chronic mesenteric ischemia ([name])[place]. Chronic pain syndrome 07/09/2016  . Crohn's disease of small and large intestines ([name])[place] followed by dr carlGlendell Dockersner  . Dairy product intolerance   . Diarrhea, functional   . Family history of adverse reaction to anesthesia   . Grover's disease    transient acantholytic dermatosis  . History of alcohol abuse   . History of basal cell carcinoma excision    2013 left leg  . History of Clostridium difficile    10/ 2014  . History of multiple concussions    x6   last one Jan 2017 per pt--  no residual  . History of substance abuse ([name])[place] quit 1997 per pt  . History of suicide attempt    05-18-2012  overdose /  failure to thrive  . Hypercholesterolemia   . Iron deficiency anemia due to chronic blood loss   . Major depression, recurrent, chronic ([name])[place]. Osteopenia   . Personal  history of adenomatous colonic polyps 12/2010, 03/2012   12/2010 - 8 mm serrated adenoma of rectum  . Portal vein thrombosis 03/21/2015   right  . Primary sclerosing cholangitis    ? hepatitis overlap - liver bx x 2 and MRCP  . Seasonal allergies   . Substance abuse ([place]) 1997   Alcohol  . Vitamin A deficiency 05/23/2018  . Vitamin B6 deficiency 07/27/2018  . Vitamin C deficiency 05/23/2018    Past Surgical History:  Procedure Laterality Date  . ABDOMINAL AORTAGRAM N/A 03/29/2012   Procedure: ABDOMINAL Maxcine Ham;  Surgeon: Serafina Mitchell, MD;  Location: [facility] CATH LAB;  Service: Cardiovascular;  Laterality: N/A;  .  ADENOIDECTOMY  age 18  . BIOPSY  05/31/2018   Procedure: BIOPSY;  Surgeon: Milus Banister, MD;  Location: [facility];  Service: Endoscopy;;  . CATARACT EXTRACTION W/ INTRAOCULAR LENS  IMPLANT, BILATERAL  2009  . COLONOSCOPY  2001, 05/02/2003, 01/28/11   2012: Right colon Crohn's, rectal polyp  . COLONOSCOPY  03/31/2012   Procedure: COLONOSCOPY;  Surgeon: Jerene Bears, MD;  Location: [facility];  Service: Gastroenterology;  Laterality: N/A;  . COLONOSCOPY WITH PROPOFOL N/A 05/31/2018   Procedure: COLONOSCOPY WITH PROPOFOL;  Surgeon: Milus Banister, MD;  Location: [facility] ENDOSCOPY;  Service: Endoscopy;  Laterality: N/A;  . ESOPHAGOGASTRODUODENOSCOPY  01/28/11   Normal  . FOOT SURGERY Right age 38  . MOHS SURGERY Left 11/2013   left ankle parakerotosis   . PERCUTANEOUS LIVER BIOPSY  2007 and 2008  . PILONIDAL CYST EXCISION  age 30  . PLACEMENT OF SETON N/A 11/06/2015   Procedure: PLACEMENT OF SETON;  Surgeon: [name], MD;  Location: [facility];  Service: General;  Laterality: N/A;  . PLACEMENT OF SETON  07/2018   at [facility]  . RECTAL EXAM UNDER ANESTHESIA N/A 01/18/2019   Procedure: ANAL EXAM UNDER ANESTHESIA,  INCISION AND DRAINAGE, SETON PLACEMENT;  Surgeon: [name], MD;  Location: [facility];  Service: General;  Laterality: N/A;  . UPPER GASTROINTESTINAL ENDOSCOPY      Family Psychiatric History: Mother depression and anxiety and father depress Family History:  Family History  Problem Relation Age of Onset  . Heart disease Father   . Thyroid cancer Father   . Allergies Father   . Clotting disorder Father   . Breast cancer Mother   . Stomach cancer Mother   . Colon cancer Neg Hx   . Esophageal cancer Neg Hx   . Rectal cancer Neg Hx     Social History:  Social History   Socioeconomic History  . Marital status: Single    Spouse name: Not on file  . Number of children: 1  . Years of education: Not on file  . Highest education level:  Not on file  Occupational History  . Occupation: Disability   Tobacco Use  . Smoking status: Former Smoker    Packs/day: 1.00    Years: 15.00    Pack years: 15.00    Types: Cigarettes    Quit date: 03/28/1997    Years since quitting: 22.9  . Smokeless tobacco: Never Used  Vaping Use  . Vaping Use: Never used  Substance and Sexual Activity  . Alcohol use: No    Alcohol/week: 0.0 standard drinks  . Drug use: Not Currently    Types: Cocaine, Marijuana    Comment: QUIT USING DRUGS IN 1997  . Sexual activity: Not Currently  Other Topics Concern  . Not  on file  Social History Narrative   Single, history of substance abuse in recovery   Previously occupied on medical disability   1 child  Daughter Theme park manager, lives and works in [place]   Elderly parents involved and he helps them   1 brother in [place]   Social Determinants of Health   Financial Resource Strain: [place]   . Difficulty of Paying Living Expenses: Not hard at all  Food Insecurity: No Food Insecurity  . Worried About Charity fundraiser in the Last Year: Never true  . Ran Out of Food in the Last Year: Never true  Transportation Needs: No Transportation Needs  . Lack of Transportation (Medical): No  . Lack of Transportation (Non-Medical): No  Physical Activity: Insufficiently Active  . Days of Exercise per Week: 3 days  . Minutes of Exercise per Session: 30 min  Stress: Stress Concern Present  . Feeling of Stress : Very much  Social Connections: Socially Isolated  . Frequency of Communication with Friends and Family: Three times a week  . Frequency of Social Gatherings with Friends and Family: Twice a week  . Attends Religious Services: Never  . Active Member of Clubs or Organizations: No  . Attends Archivist Meetings: Never  . Marital Status: Divorced    Allergies:  Allergies  Allergen Reactions  . Asacol [Mesalamine] Diarrhea    abd pain  . Flagyl [Metronidazole] Diarrhea     abd pain  . Amoxicillin Other (See Comments)    Lots of gas  . Azathioprine Diarrhea    abd pain  . Gluten Meal Diarrhea  . Metoprolol Tartrate     Stomach upset  . Mycophenolate Mofetil Diarrhea    abd pain  . Other     NUTS; constipation  . Tylenol [Acetaminophen] Other (See Comments)  . Wheat Bran Diarrhea    Constipation; flatulence; abd pain  . Tape Itching and Rash    Please use "paper" tape    Metabolic Disorder Labs: Lab Results  Component Value Date   HGBA1C 6.1 09/18/2014   No results found for: PROLACTIN Lab Results  Component Value Date   CHOL 288 (H) 02/05/2011   TRIG 76.0 02/05/2011   HDL 62.50 02/05/2011   CHOLHDL 5 02/05/2011   VLDL 15.2 02/05/2011   Lab Results  Component Value Date   TSH 2.053 07/21/2018   TSH 4.496 05/28/2018    Therapeutic Level Labs: No results found for: LITHIUM No results found for: VALPROATE No components found for:  CBMZ  Current Medications: Current Outpatient Medications  Medication Sig Dispense Refill  . Ascorbic Acid (VITAMIN C) 1000 MG tablet Take 1,000 mg by mouth daily.    . Cholecalciferol (VITAMIN D) 125 MCG (5000 UT) CAPS Take 5,000 Units by mouth daily.    . clonazePAM (KLONOPIN) 1 MG tablet Take 1 mg by mouth 2 (two) times daily. Takes 1/2 tab (0.23m)    . diltiazem 2 % GEL Apply a pea size amount into rectum 2 times a day 30 g 0  . dilTIAZem HCl POWD APPLY A PEA-SIZED AMOUNT TO RECTUM TWICE A DAY. 30 g 0  . gabapentin (NEURONTIN) 600 MG tablet TAKE 1 TABLET BY MOUTH AT BEDTIME 90 tablet 1  . HYDROcodone-acetaminophen (NORCO/VICODIN) 5-325 MG tablet Take 1 tablet by mouth every 4 (four) hours as needed for moderate pain or severe pain. 30 tablet 0  . Melatonin 3 MG CAPS Take 3 mg by mouth at bedtime as  needed.    . predniSONE (DELTASONE) 20 MG tablet TAKE 1 AND 1/2 TABLETS (30 MG TOTAL) BY MOUTH DAILY WITH BREAKFAST. 135 tablet 1  . traZODone (DESYREL) 50 MG tablet Take 1 tablet (50 mg total) by mouth at  bedtime. 30 tablet 2  . vedolizumab (ENTYVIO) 300 MG injection Inject 300 mg into the vein every 28 (twenty-eight) days. 1 each 9   No current facility-administered medications for this visit.     Musculoskeletal: Strength & Muscle Tone: Unable to assess due to telehealth visit  [place]: Unable to assess due to telehealth visit  Patient leans: N/A  Psychiatric Specialty Exam: Review of Systems  There were no vitals taken for this visit.There is no height or weight on file to calculate BMI.  General Appearance: Well Groomed  Eye Contact:  Good  Speech:  Clear and Coherent and Normal Rate  Volume:  Normal  Mood:  Euthymic  Affect:  Appropriate and Congruent  Thought Process:  Coherent, Goal Directed and Linear  Orientation:  Full (Time, Place, and Person)  Thought Content: WDL and Logical   Suicidal Thoughts:  No  Homicidal Thoughts:  No  Memory:  Immediate;   Good Recent;   Good Remote;   Good  Judgement:  Good  Insight:  Good  Psychomotor Activity:  Normal  Concentration:  Concentration: Good and Attention Span: Good  Recall:  Good  Fund of Knowledge: Good  Language: Good  Akathisia:  No  Handed:  Left  AIMS (if indicated): Not done  Assets:  Communication Skills Desire for Improvement Financial Resources/Insurance Housing Social Support  ADL's:  Intact  Cognition: WNL  Sleep:  Good   Screenings: GAD-7   Health and safety inspector from 03/06/2020 in [facility] Counselor from 11/08/2019 in [facility] Counselor from 08/28/2019 in [facility]  Total GAD-7 Score 11 17 14     PHQ2-9   Flowsheet Row Counselor from 03/06/2020 in [facility] Counselor from 08/28/2019 in [facility] Office Visit from 11/02/2018 in [facility] Patient Outreach Telephone from 08/23/2018 in [place] Patient Outreach Telephone  from 07/28/2018 in [place]  PHQ-2 Total Score 1 1 0 3 6  PHQ-9 Total Score 8 11 4 6 24        Assessment and Plan: Patient reports that he is doing well on his current medication regimen. No medication changes made today. He will continue all medications as prescribed.  1. MDD (major depressive disorder), recurrent severe, without psychosis ([place])  Continue- traZODone (DESYREL) 50 MG tablet; Take 1 tablet (50 mg total) by mouth at bedtime.  Dispense: 30 tablet; Refill: 2  Follow-up in 3 months Follow-up with therapy as needed  Salley Slaughter, NP 03/07/2020, 9:28 AM

## 2020-03-13 ENCOUNTER — Other Ambulatory Visit (INDEPENDENT_AMBULATORY_CARE_PROVIDER_SITE_OTHER): Payer: Medicare Other

## 2020-03-13 ENCOUNTER — Ambulatory Visit (INDEPENDENT_AMBULATORY_CARE_PROVIDER_SITE_OTHER): Payer: Medicare Other | Admitting: Internal Medicine

## 2020-03-13 ENCOUNTER — Encounter: Payer: Self-pay | Admitting: Internal Medicine

## 2020-03-13 VITALS — BP 116/70 | HR 64 | Ht 73.0 in | Wt 163.0 lb

## 2020-03-13 DIAGNOSIS — K50813 Crohn's disease of both small and large intestine with fistula: Secondary | ICD-10-CM

## 2020-03-13 DIAGNOSIS — Z7960 Long term (current) use of unspecified immunomodulators and immunosuppressants: Secondary | ICD-10-CM

## 2020-03-13 DIAGNOSIS — K754 Autoimmune hepatitis: Secondary | ICD-10-CM

## 2020-03-13 DIAGNOSIS — F1190 Opioid use, unspecified, uncomplicated: Secondary | ICD-10-CM | POA: Diagnosis not present

## 2020-03-13 DIAGNOSIS — D500 Iron deficiency anemia secondary to blood loss (chronic): Secondary | ICD-10-CM | POA: Diagnosis not present

## 2020-03-13 DIAGNOSIS — Z79899 Other long term (current) drug therapy: Secondary | ICD-10-CM

## 2020-03-13 DIAGNOSIS — K602 Anal fissure, unspecified: Secondary | ICD-10-CM

## 2020-03-13 DIAGNOSIS — G894 Chronic pain syndrome: Secondary | ICD-10-CM

## 2020-03-13 LAB — CBC WITH DIFFERENTIAL/PLATELET
Basophils Absolute: 0 10*3/uL (ref 0.0–0.1)
Basophils Relative: 0.1 % (ref 0.0–3.0)
Eosinophils Absolute: 0 10*3/uL (ref 0.0–0.7)
Eosinophils Relative: 0 % (ref 0.0–5.0)
HCT: 41.8 % (ref 39.0–52.0)
Hemoglobin: 13.8 g/dL (ref 13.0–17.0)
Lymphocytes Relative: 2.3 % — ABNORMAL LOW (ref 12.0–46.0)
Lymphs Abs: 0.3 10*3/uL — ABNORMAL LOW (ref 0.7–4.0)
MCHC: 32.9 g/dL (ref 30.0–36.0)
MCV: 91.9 fl (ref 78.0–100.0)
Monocytes Absolute: 0.4 10*3/uL (ref 0.1–1.0)
Monocytes Relative: 3 % (ref 3.0–12.0)
Neutro Abs: 13.8 10*3/uL — ABNORMAL HIGH (ref 1.4–7.7)
Neutrophils Relative %: 94.6 % — ABNORMAL HIGH (ref 43.0–77.0)
Platelets: 171 10*3/uL (ref 150.0–400.0)
RBC: 4.56 Mil/uL (ref 4.22–5.81)
RDW: 13.9 % (ref 11.5–15.5)
WBC: 14.5 10*3/uL — ABNORMAL HIGH (ref 4.0–10.5)

## 2020-03-13 LAB — COMPREHENSIVE METABOLIC PANEL
ALT: 204 U/L — ABNORMAL HIGH (ref 0–53)
AST: 90 U/L — ABNORMAL HIGH (ref 0–37)
Albumin: 3.8 g/dL (ref 3.5–5.2)
Alkaline Phosphatase: 206 U/L — ABNORMAL HIGH (ref 39–117)
BUN: 25 mg/dL — ABNORMAL HIGH (ref 6–23)
CO2: 28 mEq/L (ref 19–32)
Calcium: 8.6 mg/dL (ref 8.4–10.5)
Chloride: 103 mEq/L (ref 96–112)
Creatinine, Ser: 1.05 mg/dL (ref 0.40–1.50)
GFR: 79.16 mL/min (ref >60.00)
Glucose, Bld: 109 mg/dL — ABNORMAL HIGH (ref 70–99)
Potassium: 3.8 mEq/L (ref 3.5–5.1)
Sodium: 139 mEq/L (ref 135–145)
Total Bilirubin: 0.5 mg/dL (ref 0.2–1.2)
Total Protein: 6.2 g/dL (ref 6.0–8.3)

## 2020-03-13 LAB — FERRITIN: Ferritin: 47.4 ng/mL (ref 22.0–322.0)

## 2020-03-13 NOTE — Assessment & Plan Note (Signed)
Most recent liver biopsy more consistent with PSC.  Because of transaminase elevation not entirely clear but perhaps its not hepatic.  He does not seem to be getting much better with respect to biochemical patterns with the increase in prednisone.  Fortunately his liver biopsy was remarkably good all things considered.  MRI reassuring in February as well. IgG was not elevated in April.  Continue observation and current therapy.  As per Crohn's disease want to reduce prednisone and may do so sooner rather than later given that he is not having diarrhea.  Did not make a difference with his LFTs.  There does not seem to be a hepatitis component significant at this time either.

## 2020-03-13 NOTE — Assessment & Plan Note (Signed)
Ferritin level was elevated recently question acute phase reactant versus adequate iron stores or both.  CBC and ferritin today.

## 2020-03-13 NOTE — Assessment & Plan Note (Signed)
Last colonoscopy 06/19/2018 with mildly active ileitis and colitis consistent with his Crohn's disease.  He seems to be clinically better.  He was having more problems in the summer he is probably better with the increased prednisone.  That does not seem to be helping his liver however.  Going to monthly Entyvio.  Anticipate reducing prednisone.  Follow-up to be determined pending lab review.  Anticipate seeing him in 3 months most likely.  As far as his perirectal disease that is a lot better than it was he is happier without the seton in he spontaneously drains were facilitates drainage of an abscess.  He knows when to contact me for antibiotics etc. so I think we will continue as we are.  Getting him on lower dose of prednisone should be helpful as well.  Perhaps increasing Entyvio will make a difference.

## 2020-03-13 NOTE — Progress Notes (Signed)
[name] 55 y.o. Apr 12, 1965 [id number]  Assessment & Plan:   Encounter Diagnoses  Name Primary?  . Crohn's disease of both small and large intestine with fistula ([place]) Yes  . Chronic, continuous use of opioids   . Autoimmune hepatitis-PSC overlap   . Long-term use of immunosuppressant medication - Entyvio   . Anemia due to chronic blood loss   . Posterior Anal fissure   . Chronic pain syndrome    CROHN'S DISEASE, LARGE AND SMALL INTESTINES Last colonoscopy 06/19/2018 with mildly active ileitis and colitis consistent with his Crohn's disease.  He seems to be clinically better.  He was having more problems in the summer he is probably better with the increased prednisone.  That does not seem to be helping his liver however.  Going to monthly Entyvio.  Anticipate reducing prednisone.  Follow-up to be determined pending lab review.  Anticipate seeing him in 3 months most likely.  As far as his perirectal disease that is a lot better than it was he is happier without the seton in he spontaneously drains were facilitates drainage of an abscess.  He knows when to contact me for antibiotics etc. so I think we will continue as we are.  Getting him on lower dose of prednisone should be helpful as well.  Perhaps increasing Entyvio will make a difference.  Autoimmune hepatitis-PSC overlap Most recent liver biopsy more consistent with PSC.  Because of transaminase elevation not entirely clear but perhaps its not hepatic.  He does not seem to be getting much better with respect to biochemical patterns with the increase in prednisone.  Fortunately his liver biopsy was remarkably good all things considered.  MRI reassuring in February as well. IgG was not elevated in April.  Continue observation and current therapy.  As per Crohn's disease want to reduce prednisone and may do so sooner rather than later given that he is not having diarrhea.  Did not make a difference with his LFTs.  There does  not seem to be a hepatitis component significant at this time either.  Posterior Anal fissure Continue diltiazem  Long-term use of immunosuppressant medication - Entyvio No toxicity noted  Chronic pain syndrome Continue hydrocodone recheck urine metabolites.  Anemia due to chronic blood loss Ferritin level was elevated recently question acute phase reactant versus adequate iron stores or both.  CBC and ferritin today.    I appreciate the opportunity to care for this patient. CC: [name], MD Dr. [name] Dr. [name]  Subjective:   Chief Complaint: Follow-up of Crohn's and autoimmune hepatitis PSC overlap  HPI [name] is a 55 year old man with multiple gastrointestinal medical problems that include Crohn's disease of the small and large intestine with history of abscess and fistula, autoimmune hepatitis and PSC overlap, adenomatous colon polyps maintained on prednisone and Entyvio.  He is here for follow-up its been about 6 months disease been in the office.  He reports that overall he is doing well without diarrhea or abdominal pain.  However he did have rectal pain recently and he had a recurrent perirectal abscess that he facilitated drainage of in the last few days and the pain is better and is now draining.  It is draining through one of the seton tracks where his seton was removed, on the right perirectal area.  He does not think he needs antibiotics.  He continues to use hydrocodone a half a tablet twice a day or 1 tablet a day.  He did  have a urine drug screen and the hydrocodone was not detected.  He says he has not been diverting this and that would be consistent with the many years he has taken the medication.  He does continue to use diltiazem gel and triple antibiotic ointment into the anal and rectal areas.  Depression seems stable.  He continues in remission and abstinence from alcohol.  Reports that his mother still keeps his hydrocodone and dispenses it  to him.   He is about to go to monthly vedolizumab.  Serial monitoring on that in October demonstrated a trough level of 9.8 and it is thought a trough level of 14 mcg/mL during maintenance is optimal.  He did not have any antibodies.  So this week or so would be his first monthly infusion.  He reports that only one of the nurses at his infusion center is fast I will at placing IVs and he is trying to line it up so he can have his infusion when she is working.  She has currently been out of the office.  He had a liver biopsy in July of this year.  A. LIVER, LEFT, RANDOM, BIOPSY:  - Features of mild large bile duct obstruction, compatible with history  of primary sclerosing cholangitis. See comment  - Minimal portal fibrosis, Stage 0-1 of 4.   His transaminases and alk phos have been more elevated than in the past over the past several months.  I have tried increasing his prednisone to see if that would make a difference.  He did not want to take 40 mg a day he is gone from 20-30 over the past several months.   Results for [name], [name]" (MRN [id number]) as of 03/13/2020 18:12  Ref. Range 09/10/2019 10:19 11/01/2019 12:18 12/25/2019 13:08  Alkaline Phosphatase Latest Ref Range: 39 - 117 U/L 160 (H) 147 (H) 151 (H)  Albumin Latest Ref Range: 3.5 - 5.2 g/dL 3.6 3.5 3.3 (L)  AST Latest Ref Range: 0 - 37 U/L 126 (H) 71 (H) 57 (H)  ALT Latest Ref Range: 0 - 53 U/L 280 (H) 169 (H) 142 (H)  Total Protein Latest Ref Range: 6.0 - 8.3 g/dL 5.9 (L) 5.8 (L) 5.1 (L)  Bilirubin, Direct Latest Ref Range: 0.0 - 0.3 mg/dL 0.1    Total Bilirubin Latest Ref Range: 0.2 - 1.2 mg/dL 0.5 0.4 0.4  MRI MRCP 04/17/2019 images reviewed personally IMPRESSION: Cirrhosis. Stable appearance of the posterior right hepatic lobe with localized atrophy in segments 6/7. No suspicious/enhancing hepatic lesions.  Associated focal intrahepatic ductal dilatation in the posterior right hepatic lobe measuring up to  2.1 cm, unchanged from recent priors, progressive from 2017.  Portal vein is patent.  Wt Readings from Last 3 Encounters:  03/13/20 163 lb (73.9 kg)  09/18/19 160 lb (72.6 kg)  08/10/19 158 lb 4 oz (71.8 kg)    Allergies  Allergen Reactions  . Asacol [Mesalamine] Diarrhea    abd pain  . Flagyl [Metronidazole] Diarrhea    abd pain  . Amoxicillin Other (See Comments)    Lots of gas  . Azathioprine Diarrhea    abd pain  . Gluten Meal Diarrhea  . Metoprolol Tartrate     Stomach upset  . Mycophenolate Mofetil Diarrhea    abd pain  . Other     NUTS; constipation  . Tylenol [Acetaminophen] Other (See Comments)  . Wheat Bran Diarrhea    Constipation; flatulence; abd pain  . Tape Itching and Rash  Please use "paper" tape   Current Meds  Medication Sig  . Ascorbic Acid (VITAMIN C) 1000 MG tablet Take 1,000 mg by mouth daily.  . Cholecalciferol (VITAMIN D) 125 MCG (5000 UT) CAPS Take 5,000 Units by mouth daily.  . clonazePAM (KLONOPIN) 1 MG tablet Take 1 mg by mouth 2 (two) times daily. Takes 1/2 tab (0.32m)  . diltiazem 2 % GEL Apply a pea size amount into rectum 2 times a day  . dilTIAZem HCl POWD APPLY A PEA-SIZED AMOUNT TO RECTUM TWICE A DAY.  .Marland Kitchengabapentin (NEURONTIN) 600 MG tablet TAKE 1 TABLET BY MOUTH AT BEDTIME  . HYDROcodone-acetaminophen (NORCO/VICODIN) 5-325 MG tablet Take 1 tablet by mouth every 4 (four) hours as needed for moderate pain or severe pain.  . Melatonin 3 MG CAPS Take 3 mg by mouth at bedtime as needed.  . predniSONE (DELTASONE) 20 MG tablet TAKE 1 AND 1/2 TABLETS (30 MG TOTAL) BY MOUTH DAILY WITH BREAKFAST.  .Marland KitchentraZODone (DESYREL) 50 MG tablet Take 1 tablet (50 mg total) by mouth at bedtime.  . vedolizumab (ENTYVIO) 300 MG injection Inject 300 mg into the vein every 28 (twenty-eight) days.   Past Medical History:  Diagnosis Date  . Allergy   . Anal fistula   . Anxiety   . 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 Autoimmune hepatitis ([place]  01/19/2013   liver function checked every 2 or 3 months sees dr [name] . Avoidant-restrictive food intake disorder (ARFID) ? 06/15/2018  . Cancer of skin of neck   . Cataract   . Chronic mesenteric ischemia ([place]   . Chronic pain syndrome 07/09/2016  . Crohn's disease of small and large intestines ([place]    followed by dr [name]  . Dairy product intolerance   . Diarrhea, functional   . Family history of adverse reaction to anesthesia   . Grover's disease    transient acantholytic dermatosis  . History of alcohol abuse   . History of basal cell carcinoma excision    2013 left leg  . History of Clostridium difficile    10/ 2014  . History of multiple concussions    x6   last one Jan 2017 per pt--  no residual  . [place] of substance abuse ([place]    quit 1997 per pt  . History of suicide attempt    05-18-2012  overdose /  failure to thrive  . Hypercholesterolemia   . Iron deficiency anemia due to chronic blood loss   . Major depression, recurrent, chronic ([place]   . Osteopenia   . Personal history of adenomatous colonic polyps 12/2010, 03/2012   12/2010 - 8 mm serrated adenoma of rectum  . Portal vein thrombosis 03/21/2015   right  . Primary sclerosing cholangitis    ? hepatitis overlap - liver bx x 2 and MRCP  . Seasonal allergies   . Substance abuse ([place] 1997   Alcohol  . Vitamin A deficiency 05/23/2018  . Vitamin B6 deficiency 07/27/2018  . Vitamin C deficiency 05/23/2018   Past Surgical History:  Procedure Laterality Date  . ABDOMINAL AORTAGRAM N/A 03/29/2012   Procedure: ABDOMINAL AMaxcine Ham  Surgeon: VSerafina Mitchell MD;  [facility];  Service: Cardiovascular;  Laterality: N/A;  . ADENOIDECTOMY  age 55 . BIOPSY  05/31/2018   Procedure: BIOPSY;  Surgeon: JMilus Banister MD;  [facility];  Service: Endoscopy;;  . CATARACT EXTRACTION W/ INTRAOCULAR LENS  IMPLANT, BILATERAL  2009  . COLONOSCOPY  2001, 05/02/2003, 01/28/11   2012: Right colon  Crohn's, rectal polyp  . COLONOSCOPY  03/31/2012   Procedure: COLONOSCOPY;  Surgeon: Jerene Bears, MD;  Location: [place];  Service: Gastroenterology;  Laterality: N/A;  . COLONOSCOPY WITH PROPOFOL N/A 05/31/2018   Procedure: COLONOSCOPY WITH PROPOFOL;  Surgeon: Milus Banister, MD;  [facility];  Service: Endoscopy;  Laterality: N/A;  . ESOPHAGOGASTRODUODENOSCOPY  01/28/11   Normal  . FOOT SURGERY Right age 60  . MOHS SURGERY Left 11/2013   left ankle parakerotosis   . PERCUTANEOUS LIVER BIOPSY  2007 and 2008  . PILONIDAL CYST EXCISION  age 37  . PLACEMENT OF SETON N/A 11/06/2015   Procedure: PLACEMENT OF SETON;  Surgeon: [name], MD;  Location: [facility];  Service: General;  Laterality: N/A;  . PLACEMENT OF SETON  07/2018   at [facility]  . RECTAL EXAM UNDER ANESTHESIA N/A 01/18/2019   Procedure: ANAL EXAM UNDER ANESTHESIA,  INCISION AND DRAINAGE, SETON PLACEMENT;  Surgeon: [name], MD;  Location: [place];  Service: General;  Laterality: N/A;  . UPPER GASTROINTESTINAL ENDOSCOPY     Social History   Social History Narrative   Single, history of substance abuse in recovery   Previously occupied on medical disability   1 child  Daughter Theme park manager, lives and works in [place]   Elderly parents involved and he helps them   1 brother in [place]   family history includes Allergies in his father; Breast cancer in his mother; Clotting disorder in his father; Heart disease in his father; Stomach cancer in his mother; Thyroid cancer in his father.   Review of Systems As per HPI  Objective:   Physical Exam BP 116/70   Pulse 64   Ht 6' 1"  (1.854 m)   Wt 163 lb (73.9 kg)   BMI 21.51 kg/m  Chronically ill middle-aged white man in no acute distress Eyes anicteric Lungs are clear Heart sounds are normal Abdomen is soft and nontender without organomegaly or mass and bowel sounds are present. Skin with some scaly  dry hyperpigmented truncal lesions consistent with his Grovers disease. Appropriate mood and affect He is alert and oriented x3  Rectal exam is not performed inspection see photos attached.  There is a lot of scarring to palpation in the right posterior area there is a fistulous opening and pus emanates from this on digital exam which is somewhat tender with some anal stenosis.

## 2020-03-13 NOTE — Assessment & Plan Note (Signed)
Continue hydrocodone recheck urine metabolites.

## 2020-03-13 NOTE — Assessment & Plan Note (Signed)
Continue diltiazem

## 2020-03-13 NOTE — Patient Instructions (Signed)
Your provider has requested that you go to the basement level for lab work before leaving today. Press "B" on the elevator. The lab is located at the first door on the left as you exit the elevator.  Due to recent changes in healthcare laws, you may see the results of your imaging and laboratory studies on MyChart before your provider has had a chance to review them.  We understand that in some cases there may be results that are confusing or concerning to you. Not all laboratory results come back in the same time frame and the provider may be waiting for multiple results in order to interpret others.  Please give [place] 48 hours in order for your provider to thoroughly review all the results before contacting the office for clarification of your results.   I appreciate the opportunity to care for you. Silvano Rusk, MD, [facility]

## 2020-03-13 NOTE — Assessment & Plan Note (Signed)
No toxicity noted

## 2020-03-15 LAB — DRUG SCR UR, PAIN MGMT, REFLEX CONF
Amphetamine Scrn, Ur: NEGATIVE ng/mL
BARBITURATE SCREEN URINE: NEGATIVE ng/mL
BENZODIAZEPINE SCREEN, URINE: NEGATIVE ng/mL
Cocaine (Metab) Scrn, Ur: NEGATIVE ng/mL
Creatinine(Crt), U: 23.6 mg/dL (ref 20.0–300.0)
Methadone Screen, Urine: NEGATIVE ng/mL
OXYCODONE+OXYMORPHONE UR QL SCN: NEGATIVE ng/mL
Opiate Scrn, Ur: NEGATIVE ng/mL
Ph of Urine: 5.3 (ref 4.5–8.9)
Phencyclidine Qn, Ur: NEGATIVE ng/mL
Propoxyphene Scrn, Ur: NEGATIVE ng/mL

## 2020-03-18 ENCOUNTER — Other Ambulatory Visit: Payer: Self-pay | Admitting: Internal Medicine

## 2020-03-18 NOTE — Telephone Encounter (Signed)
Please advise Sir, thank you. 

## 2020-03-19 DIAGNOSIS — M9903 Segmental and somatic dysfunction of lumbar region: Secondary | ICD-10-CM | POA: Diagnosis not present

## 2020-03-20 ENCOUNTER — Telehealth: Payer: Self-pay

## 2020-03-20 ENCOUNTER — Other Ambulatory Visit: Payer: Self-pay

## 2020-03-20 DIAGNOSIS — D500 Iron deficiency anemia secondary to blood loss (chronic): Secondary | ICD-10-CM

## 2020-03-20 DIAGNOSIS — K50813 Crohn's disease of both small and large intestine with fistula: Secondary | ICD-10-CM

## 2020-03-20 DIAGNOSIS — K5090 Crohn's disease, unspecified, without complications: Secondary | ICD-10-CM | POA: Diagnosis not present

## 2020-03-20 MED ORDER — INJECTAFER 750 MG/15ML IV SOLN: 750.0000 mg | INTRAVENOUS | 0 refills | Status: AC

## 2020-03-20 NOTE — Progress Notes (Signed)
New orders faxed to [facility].

## 2020-03-20 NOTE — Telephone Encounter (Signed)
Orders faxed to [facility]

## 2020-03-20 NOTE — Telephone Encounter (Signed)
-----   Message from [name], MD sent at 03/19/2020  4:13 PM EST ----- Regarding: injectafer His ferritin is low Nl and is asking for Injectafer  I told him I doubt they would do it at that level but can you see if they will do it at [place]  If not he will have to wait til he meets criteria which will occur at some point

## 2020-03-24 DIAGNOSIS — D509 Iron deficiency anemia, unspecified: Secondary | ICD-10-CM | POA: Diagnosis not present

## 2020-03-31 ENCOUNTER — Other Ambulatory Visit: Payer: Self-pay | Admitting: Internal Medicine

## 2020-03-31 DIAGNOSIS — D509 Iron deficiency anemia, unspecified: Secondary | ICD-10-CM | POA: Diagnosis not present

## 2020-03-31 MED ORDER — HYDROCODONE-ACETAMINOPHEN 5-325 MG PO TABS: 1.0000 | ORAL_TABLET | ORAL | 0 refills | Status: DC | PRN

## 2020-04-02 DIAGNOSIS — M9903 Segmental and somatic dysfunction of lumbar region: Secondary | ICD-10-CM | POA: Diagnosis not present

## 2020-04-17 DIAGNOSIS — K5090 Crohn's disease, unspecified, without complications: Secondary | ICD-10-CM | POA: Diagnosis not present

## 2020-04-17 DIAGNOSIS — M9903 Segmental and somatic dysfunction of lumbar region: Secondary | ICD-10-CM | POA: Diagnosis not present

## 2020-04-29 ENCOUNTER — Other Ambulatory Visit: Payer: Self-pay | Admitting: Internal Medicine

## 2020-04-29 MED ORDER — HYDROCODONE-ACETAMINOPHEN 5-325 MG PO TABS: 1.0000 | ORAL_TABLET | ORAL | 0 refills | Status: DC | PRN

## 2020-04-30 DIAGNOSIS — M9903 Segmental and somatic dysfunction of lumbar region: Secondary | ICD-10-CM | POA: Diagnosis not present

## 2020-05-09 DIAGNOSIS — Z23 Encounter for immunization: Secondary | ICD-10-CM | POA: Diagnosis not present

## 2020-05-09 DIAGNOSIS — L02215 Cutaneous abscess of perineum: Secondary | ICD-10-CM | POA: Diagnosis not present

## 2020-05-15 DIAGNOSIS — K5090 Crohn's disease, unspecified, without complications: Secondary | ICD-10-CM | POA: Diagnosis not present

## 2020-05-22 DIAGNOSIS — M9903 Segmental and somatic dysfunction of lumbar region: Secondary | ICD-10-CM | POA: Diagnosis not present

## 2020-05-29 ENCOUNTER — Other Ambulatory Visit: Payer: Self-pay | Admitting: Internal Medicine

## 2020-05-29 MED ORDER — HYDROCODONE-ACETAMINOPHEN 5-325 MG PO TABS: 1.0000 | ORAL_TABLET | ORAL | 0 refills | Status: DC | PRN

## 2020-05-29 NOTE — Progress Notes (Signed)
[name] 55 y.o. [birth year] [id number]  Assessment & Plan:      Subjective:   Chief Complaint:  HPI  Allergies  Allergen Reactions  . Asacol [Mesalamine] Diarrhea    abd pain  . Flagyl [Metronidazole] Diarrhea    abd pain  . Amoxicillin Other (See Comments)    Lots of gas  . Azathioprine Diarrhea    abd pain  . Gluten Meal Diarrhea  . Metoprolol Tartrate     Stomach upset  . Mycophenolate Mofetil Diarrhea    abd pain  . Other     NUTS; constipation  . Tylenol [Acetaminophen] Other (See Comments)  . Wheat Bran Diarrhea    Constipation; flatulence; abd pain  . Tape Itching and Rash    Please use "paper" tape   No outpatient medications have been marked as taking for the 05/29/20 encounter (Orders Only) with [name] Mayer, MD.   Past Medical History:  Diagnosis Date  . Allergy   . Anal fistula   . Anxiety   . 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 Autoimmune hepatitis ([place]) 01/19/2013   liver function checked every 2 or 3 months sees [name] [name]  . Avoidant-restrictive food intake disorder (ARFID) ? 06/15/2018  . Cancer of skin of neck   . Cataract   . Chronic mesenteric ischemia ([place])   . Chronic pain syndrome 07/09/2016  . Crohn's disease of small and large intestines ([place])    followed by [name]  . Dairy product intolerance   . Diarrhea, functional   . Family history of adverse reaction to anesthesia   . Grover's disease    transient acantholytic dermatosis  . History of alcohol abuse   . History of basal cell carcinoma excision    2013 left leg  . History of Clostridium difficile    10/ 2014  . History of multiple concussions    x6   last one Jan 2017 per pt--  no residual  . History of substance abuse ([place])    quit 1997 per pt  . History of suicide attempt    05-18-2012  overdose /  failure to thrive  . Hypercholesterolemia   . Iron deficiency anemia due to chronic blood loss   . Major depression, recurrent, chronic  ([place])   . Osteopenia   . Personal history of adenomatous colonic polyps 12/2010, 03/2012   12/2010 - 8 mm serrated adenoma of rectum  . Portal vein thrombosis 03/21/2015   right  . Primary sclerosing cholangitis    ? hepatitis overlap - liver bx x 2 and MRCP  . Seasonal allergies   . Substance abuse ([place]) 1997   Alcohol  . Vitamin A deficiency 05/23/2018  . Vitamin B6 deficiency 07/27/2018  . Vitamin C deficiency 05/23/2018   Past Surgical History:  Procedure Laterality Date  . ABDOMINAL AORTAGRAM N/A 03/29/2012   Procedure: ABDOMINAL [name];  Surgeon: [name] Mitchell, MD;  Location[facility] LAB;  Service: Cardiovascular;  Laterality: N/A;  . ADENOIDECTOMY  age 59  . BIOPSY  05/31/2018   Procedure: BIOPSY;  Surgeon: [name] Banister, MD;  Location: [name];  Service: Endoscopy;;  . CATARACT EXTRACTION W/ INTRAOCULAR LENS  IMPLANT, BILATERAL  2009  . COLONOSCOPY  2001, 05/02/2003, 01/28/11   2012: Right colon Crohn's, rectal polyp  . COLONOSCOPY  03/31/2012   Procedure: COLONOSCOPY;  Surgeon: [name] Bears, MD;  Location: [place];  Service: Gastroenterology;  Laterality: N/A;  .  COLONOSCOPY WITH PROPOFOL N/A 05/31/2018   Procedure: COLONOSCOPY WITH PROPOFOL;  Surgeon: [name] Banister, MD;  [facility];  Service: Endoscopy;  Laterality: N/A;  . ESOPHAGOGASTRODUODENOSCOPY  01/28/11   Normal  . FOOT SURGERY Right age 68  . MOHS SURGERY Left 11/2013   left ankle parakerotosis   . PERCUTANEOUS LIVER BIOPSY  2007 and 2008  . PILONIDAL CYST EXCISION  age 62  . PLACEMENT OF SETON N/A 11/06/2015   Procedure: PLACEMENT OF SETON;  Surgeon: [name] Ruff, MD;  Location[facility];  Service: General;  Laterality: N/A;  . PLACEMENT OF SETON  07/2018   at [facility]  . RECTAL EXAM UNDER ANESTHESIA N/A 01/18/2019   Procedure: ANAL EXAM UNDER ANESTHESIA,  INCISION AND DRAINAGE, SETON PLACEMENT;  Surgeon: [name] Ruff, MD;  Location: [place];   Service: General;  Laterality: N/A;  . UPPER GASTROINTESTINAL ENDOSCOPY     Social History   Social History Narrative   Single, history of substance abuse in recovery   Previously occupied on medical disability   1 child  Daughter Theme park manager, lives and works in [place]   Elderly parents involved and he helps them   1 brother in [place]   family history includes Allergies in his father; Breast cancer in his mother; Clotting disorder in his father; Heart disease in his father; Stomach cancer in his mother; Thyroid cancer in his father.   Review of Systems   Objective:   Physical Exam

## 2020-06-06 ENCOUNTER — Telehealth (INDEPENDENT_AMBULATORY_CARE_PROVIDER_SITE_OTHER): Payer: Medicare Other | Admitting: Psychiatry

## 2020-06-06 ENCOUNTER — Other Ambulatory Visit: Payer: Self-pay

## 2020-06-06 ENCOUNTER — Encounter (HOSPITAL_COMMUNITY): Payer: Self-pay | Admitting: Psychiatry

## 2020-06-06 DIAGNOSIS — F332 Major depressive disorder, recurrent severe without psychotic features: Secondary | ICD-10-CM

## 2020-06-06 MED ORDER — TRAZODONE HCL 50 MG PO TABS
50.0000 mg | ORAL_TABLET | Freq: Every day | ORAL | 2 refills | Status: DC
Start: 2020-06-06 — End: 2023-03-29

## 2020-06-06 NOTE — Progress Notes (Signed)
[facility] MD/PA/NP OP Progress Note Virtual Visit via Video Note  I connected with [name] on 06/06/20 at 11:00 AM EDT by a video enabled telemedicine application and verified that I am speaking with the correct person using two identifiers.  Location: Patient: Home Provider: Clinic   I discussed the limitations of evaluation and management by telemedicine and the availability of in person appointments. The patient expressed understanding and agreed to proceed.  I provided 30 minutes of non-face-to-face time during this encounter.     06/06/2020 11:19 AM [name]  MRN:  [id number]  Chief Complaint:  "I have been feeling better and getting more involved."  HPI: 55 year old male seen today for follow-up psychiatric evaluation. He has a past medical history of PTSD, GAD, and MDD and is currently managed on Trazodone 59m at night, Gabapentin 60101mat night [prescribed by his gastroenterologist], Melatonin 42m41mt night which he purchases off [employer], and Klonopin 0.5mg542m twice daily [prescribed by his primary care]. He notes his medications are effective in managing his psychiatric condtions.   Today he is pleasant, cooperative, maintained eye contact, and engaged in conversation. He notes that since his last visit he has been feeling better and getting more involved. He informed provider that he has minimal anxiety and depression. Provider conducted a  GAD 7 and patient scored an 10, at his last visit he scored an 11. A PHQ 9 was also conducted 2 and patient scored 5, at his last visit he scored an 8.  He notes that he sleeps 5 hours a night.  He denies AVH/SI/HI. He notes that his appetite has increased and notes that he gained approximately 10 lbs.   Patient note that he continues to experience pain in his back, neck, and shoulder but reports that hydrocodone helps with his pain. He informed writProbation officert he plans to rest and watch golf for the rest of the day.  No medication  changes made today. Patient will continue all medications as prescribed. No other concerns noted at this time.   Visit Diagnosis:    ICD-10-CM   1. MDD (major depressive disorder), recurrent severe, without psychosis ([name])K[place]33.2 traZODone (DESYREL) 50 MG tablet    Past Psychiatric History: PTSD, GAD, and MDD   Past Medical History:  Past Medical History:  Diagnosis Date  . Allergy   . Anal fistula   . Anxiety   . 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 Autoimmune hepatitis ([name])[place]/21/2014   liver function checked every 2 or 3 months sees dr gessCarlean PurlAvoidant-restrictive food intake disorder (ARFID) ? 06/15/2018  . Cancer of skin of neck   . Cataract   . Chronic mesenteric ischemia ([name])[place]. Chronic pain syndrome 07/09/2016  . Crohn's disease of small and large intestines ([name])[place] followed by dr carlGlendell Dockersner  . Dairy product intolerance   . Diarrhea, functional   . Family history of adverse reaction to anesthesia   . Grover's disease    transient acantholytic dermatosis  . History of alcohol abuse   . History of basal cell carcinoma excision    2013 left leg  . History of Clostridium difficile    10/ 2014  . History of multiple concussions    x6   last one Jan 2017 per pt--  no residual  . History of substance abuse ([name])[place] quit 1997 per pt  . History of suicide attempt  05-18-2012  overdose /  failure to thrive  . Hypercholesterolemia   . Iron deficiency anemia due to chronic blood loss   . Major depression, recurrent, chronic ([place])   . Osteopenia   . Personal history of adenomatous colonic polyps 12/2010, 03/2012   12/2010 - 8 mm serrated adenoma of rectum  . Portal vein thrombosis 03/21/2015   right  . Primary sclerosing cholangitis    ? hepatitis overlap - liver bx x 2 and MRCP  . Seasonal allergies   . Substance abuse ([place]) 1997   Alcohol  . Vitamin A deficiency 05/23/2018  . Vitamin B6 deficiency 07/27/2018  . Vitamin C deficiency 05/23/2018    Past  Surgical History:  Procedure Laterality Date  . ABDOMINAL AORTAGRAM N/A 03/29/2012   Procedure: ABDOMINAL Maxcine Ham;  Surgeon: Serafina Mitchell, MD;  Location: [facility] CATH LAB;  Service: Cardiovascular;  Laterality: N/A;  . ADENOIDECTOMY  age 5  . BIOPSY  05/31/2018   Procedure: BIOPSY;  Surgeon: Milus Banister, MD;  Location: [facility];  Service: Endoscopy;;  . CATARACT EXTRACTION W/ INTRAOCULAR LENS  IMPLANT, BILATERAL  2009  . COLONOSCOPY  2001, 05/02/2003, 01/28/11   2012: Right colon Crohn's, rectal polyp  . COLONOSCOPY  03/31/2012   Procedure: COLONOSCOPY;  Surgeon: Jerene Bears, MD;  Location: [facility];  Service: Gastroenterology;  Laterality: N/A;  . COLONOSCOPY WITH PROPOFOL N/A 05/31/2018   Procedure: COLONOSCOPY WITH PROPOFOL;  Surgeon: Milus Banister, MD;  Location: [facility] END[facility];  Service: Endoscopy;  Laterality: N/A;  . ESOPHAGOGASTRODUODENOSCOPY  01/28/11   Normal  . FOOT SURGERY Right age 17  . MOHS SURGERY Left 11/2013   left ankle parakerotosis   . PERCUTANEOUS LIVER BIOPSY  2007 and 2008  . PILONIDAL CYST EXCISION  age 13  . PLACEMENT OF SETON N/A 11/06/2015   Procedure: PLACEMENT OF SETON;  Surgeon: [name], MD;  Location: [facility];  Service: General;  Laterality: N/A;  . PLACEMENT OF SETON  07/2018   at [facility]  . RECTAL EXAM UNDER ANESTHESIA N/A 01/18/2019   Procedure: ANAL EXAM UNDER ANESTHESIA,  INCISION AND DRAINAGE, SETON PLACEMENT;  Surgeon: [name], MD;  Location: [facility];  Service: General;  Laterality: N/A;  . UPPER GASTROINTESTINAL ENDOSCOPY      Family Psychiatric History: Mother depression and anxiety and father depress Family History:  Family History  Problem Relation Age of Onset  . Heart disease Father   . Thyroid cancer Father   . Allergies Father   . Clotting disorder Father   . Breast cancer Mother   . Stomach cancer Mother   . Colon cancer Neg Hx   . Esophageal cancer Neg Hx   . Rectal  cancer Neg Hx     Social History:  Social History   Socioeconomic History  . Marital status: Single    Spouse name: Not on file  . Number of children: 1  . Years of education: Not on file  . Highest education level: Not on file  Occupational History  . Occupation: Disability   Tobacco Use  . Smoking status: Former Smoker    Packs/day: 1.00    Years: 15.00    Pack years: 15.00    Types: Cigarettes    Quit date: 03/28/1997    Years since quitting: 23.2  . Smokeless tobacco: Never Used  Vaping Use  . Vaping Use: Never used  Substance and Sexual Activity  . Alcohol use: No  Alcohol/week: 0.0 standard drinks  . Drug use: Not Currently    Types: Cocaine, Marijuana    Comment: QUIT USING DRUGS IN 1997  . Sexual activity: Not Currently  Other Topics Concern  . Not on file  Social History Narrative   Single, history of substance abuse in recovery   Previously occupied on medical disability   1 child  Daughter Theme park manager, lives and works in [place]   Elderly parents involved and he helps them   1 brother in [place]   Social Determinants of Health   Financial Resource Strain: [place]   . Difficulty of Paying Living Expenses: Not hard at all  Food Insecurity: No Food Insecurity  . Worried About Charity fundraiser in the Last Year: Never true  . Ran Out of Food in the Last Year: Never true  Transportation Needs: No Transportation Needs  . Lack of Transportation (Medical): No  . Lack of Transportation (Non-Medical): No  Physical Activity: Insufficiently Active  . Days of Exercise per Week: 3 days  . Minutes of Exercise per Session: 30 min  Stress: Stress Concern Present  . Feeling of Stress : Very much  Social Connections: Socially Isolated  . Frequency of Communication with Friends and Family: Three times a week  . Frequency of Social Gatherings with Friends and Family: Twice a week  . Attends Religious Services: Never  . Active Member of Clubs or  Organizations: No  . Attends Archivist Meetings: Never  . Marital Status: Divorced    Allergies:  Allergies  Allergen Reactions  . Asacol [Mesalamine] Diarrhea    abd pain  . Flagyl [Metronidazole] Diarrhea    abd pain  . Amoxicillin Other (See Comments)    Lots of gas  . Azathioprine Diarrhea    abd pain  . Gluten Meal Diarrhea  . Metoprolol Tartrate     Stomach upset  . Mycophenolate Mofetil Diarrhea    abd pain  . Other     NUTS; constipation  . Tylenol [Acetaminophen] Other (See Comments)  . Wheat Bran Diarrhea    Constipation; flatulence; abd pain  . Tape Itching and Rash    Please use "paper" tape    Metabolic Disorder Labs: Lab Results  Component Value Date   HGBA1C 6.1 09/18/2014   No results found for: PROLACTIN Lab Results  Component Value Date   CHOL 288 (H) 02/05/2011   TRIG 76.0 02/05/2011   HDL 62.50 02/05/2011   CHOLHDL 5 02/05/2011   VLDL 15.2 02/05/2011   Lab Results  Component Value Date   TSH 2.053 07/21/2018   TSH 4.496 05/28/2018    Therapeutic Level Labs: No results found for: LITHIUM No results found for: VALPROATE No components found for:  CBMZ  Current Medications: Current Outpatient Medications  Medication Sig Dispense Refill  . Ascorbic Acid (VITAMIN C) 1000 MG tablet Take 1,000 mg by mouth daily.    . Cholecalciferol (VITAMIN D) 125 MCG (5000 UT) CAPS Take 5,000 Units by mouth daily.    . clonazePAM (KLONOPIN) 1 MG tablet Take 1 mg by mouth 2 (two) times daily. Takes 1/2 tab (0.89m)    . diltiazem 2 % GEL Apply a pea size amount into rectum 2 times a day 30 g 0  . dilTIAZem HCl POWD APPLY A PEA-SIZED AMOUNT TO RECTUM TWICE A DAY. 30 g 0  . gabapentin (NEURONTIN) 600 MG tablet TAKE 1 TABLET BY MOUTH AT BEDTIME 90 tablet 1  . HYDROcodone-acetaminophen (  NORCO/VICODIN) 5-325 MG tablet Take 1 tablet by mouth every 4 (four) hours as needed for moderate pain or severe pain. 30 tablet 0  . Melatonin 3 MG CAPS Take 3 mg by  mouth at bedtime as needed.    . predniSONE (DELTASONE) 20 MG tablet TAKE 1 AND 1/2 TABLETS (30 MG TOTAL) BY MOUTH DAILY WITH BREAKFAST. 135 tablet 1  . traZODone (DESYREL) 50 MG tablet Take 1 tablet (50 mg total) by mouth at bedtime. 30 tablet 2  . vedolizumab (ENTYVIO) 300 MG injection Inject 300 mg into the vein every 28 (twenty-eight) days. 1 each 9   No current facility-administered medications for this visit.     Musculoskeletal: Strength & Muscle Tone: Unable to assess due to telehealth visit  [place]: Unable to assess due to telehealth visit  Patient leans: N/A  Psychiatric Specialty Exam: Review of Systems  There were no vitals taken for this visit.There is no height or weight on file to calculate BMI.  General Appearance: Well Groomed  Eye Contact:  Good  Speech:  Clear and Coherent and Normal Rate  Volume:  Normal  Mood:  Euthymic  Affect:  Appropriate and Congruent  Thought Process:  Coherent, Goal Directed and Linear  Orientation:  Full (Time, Place, and Person)  Thought Content: WDL and Logical   Suicidal Thoughts:  No  Homicidal Thoughts:  No  Memory:  Immediate;   Good Recent;   Good Remote;   Good  Judgement:  Good  Insight:  Good  Psychomotor Activity:  Normal  Concentration:  Concentration: Good and Attention Span: Good  Recall:  Good  Fund of Knowledge: Good  Language: Good  Akathisia:  No  Handed:  Left  AIMS (if indicated): Not done  Assets:  Communication Skills Desire for Improvement Financial Resources/Insurance Housing Social Support  ADL's:  Intact  Cognition: WNL  Sleep:  Good   Screenings: GAD-7   Flowsheet Row Video Visit from 06/06/2020 in [facility] Counselor from 03/06/2020 in [facility] Counselor from 11/08/2019 in [facility] Counselor from 08/28/2019 in [facility]  Total GAD-7 Score 10 11 17 14     PHQ2-9    Flowsheet Row Video Visit from 06/06/2020 in [facility] Counselor from 03/06/2020 in [facility] Counselor from 08/28/2019 in [facility] Office Visit from 11/02/2018 in [facility] Patient Outreach Telephone from 08/23/2018 in [place]  PHQ-2 Total Score 0 1 1 0 3  PHQ-9 Total Score 5 8 11 4 6     Flowsheet Row Video Visit from 06/06/2020 in [facility] ED from 08/26/2018 in [facility] from 08/25/2018 in [place] DEPT  C-SSRS RISK CATEGORY No Risk Error: Q6 is Yes, you must answer 7 Error: Q7 should not be populated when Q6 is No       Assessment and Plan: Patient reports that he is doing well on his current medication regimen. No medication changes made today. He will continue all medications as prescribed.  1. MDD (major depressive disorder), recurrent severe, without psychosis ([place])  Continue- traZODone (DESYREL) 50 MG tablet; Take 1 tablet (50 mg total) by mouth at bedtime.  Dispense: 30 tablet; Refill: 2  Follow-up in 3 months Follow-up with therapy as needed  Salley Slaughter, NP 06/06/2020, 11:19 AM

## 2020-06-11 DIAGNOSIS — M9903 Segmental and somatic dysfunction of lumbar region: Secondary | ICD-10-CM | POA: Diagnosis not present

## 2020-06-12 ENCOUNTER — Other Ambulatory Visit: Payer: Self-pay | Admitting: Internal Medicine

## 2020-06-12 DIAGNOSIS — K5090 Crohn's disease, unspecified, without complications: Secondary | ICD-10-CM | POA: Diagnosis not present

## 2020-06-12 DIAGNOSIS — D500 Iron deficiency anemia secondary to blood loss (chronic): Secondary | ICD-10-CM

## 2020-06-12 DIAGNOSIS — K50813 Crohn's disease of both small and large intestine with fistula: Secondary | ICD-10-CM

## 2020-06-18 ENCOUNTER — Other Ambulatory Visit (INDEPENDENT_AMBULATORY_CARE_PROVIDER_SITE_OTHER): Payer: Medicare Other

## 2020-06-18 DIAGNOSIS — D500 Iron deficiency anemia secondary to blood loss (chronic): Secondary | ICD-10-CM

## 2020-06-18 DIAGNOSIS — K50813 Crohn's disease of both small and large intestine with fistula: Secondary | ICD-10-CM

## 2020-06-18 LAB — CBC
HCT: 41.7 % (ref 39.0–52.0)
Hemoglobin: 13.7 g/dL (ref 13.0–17.0)
MCHC: 32.8 g/dL (ref 30.0–36.0)
MCV: 91.4 fl (ref 78.0–100.0)
Platelets: 168 10*3/uL (ref 150.0–400.0)
RBC: 4.56 Mil/uL (ref 4.22–5.81)
RDW: 15.7 % — ABNORMAL HIGH (ref 11.5–15.5)
WBC: 13.3 10*3/uL — ABNORMAL HIGH (ref 4.0–10.5)

## 2020-06-18 LAB — FERRITIN: Ferritin: 39.7 ng/mL (ref 22.0–322.0)

## 2020-06-18 LAB — COMPREHENSIVE METABOLIC PANEL
ALT: 126 U/L — ABNORMAL HIGH (ref 0–53)
AST: 66 U/L — ABNORMAL HIGH (ref 0–37)
Albumin: 3.3 g/dL — ABNORMAL LOW (ref 3.5–5.2)
Alkaline Phosphatase: 156 U/L — ABNORMAL HIGH (ref 39–117)
BUN: 24 mg/dL — ABNORMAL HIGH (ref 6–23)
CO2: 30 mEq/L (ref 19–32)
Calcium: 8.5 mg/dL (ref 8.4–10.5)
Chloride: 104 mEq/L (ref 96–112)
Creatinine, Ser: 1.21 mg/dL (ref 0.40–1.50)
GFR: 66.65 mL/min (ref >60.00)
Glucose, Bld: 83 mg/dL (ref 70–99)
Potassium: 3.8 mEq/L (ref 3.5–5.1)
Sodium: 141 mEq/L (ref 135–145)
Total Bilirubin: 0.3 mg/dL (ref 0.2–1.2)
Total Protein: 5.7 g/dL — ABNORMAL LOW (ref 6.0–8.3)

## 2020-06-19 ENCOUNTER — Other Ambulatory Visit: Payer: Self-pay | Admitting: Internal Medicine

## 2020-06-19 DIAGNOSIS — Z7960 Long term (current) use of unspecified immunomodulators and immunosuppressants: Secondary | ICD-10-CM

## 2020-06-19 DIAGNOSIS — D500 Iron deficiency anemia secondary to blood loss (chronic): Secondary | ICD-10-CM

## 2020-06-19 DIAGNOSIS — K50813 Crohn's disease of both small and large intestine with fistula: Secondary | ICD-10-CM

## 2020-06-19 DIAGNOSIS — Z79899 Other long term (current) drug therapy: Secondary | ICD-10-CM

## 2020-06-19 NOTE — Telephone Encounter (Signed)
[name], he is aware you are out of the office.  Please advise if you want to proceed with testing or infusions

## 2020-06-24 DIAGNOSIS — D485 Neoplasm of uncertain behavior of skin: Secondary | ICD-10-CM | POA: Diagnosis not present

## 2020-06-24 DIAGNOSIS — C44619 Basal cell carcinoma of skin of left upper limb, including shoulder: Secondary | ICD-10-CM | POA: Diagnosis not present

## 2020-06-24 DIAGNOSIS — D2271 Melanocytic nevi of right lower limb, including hip: Secondary | ICD-10-CM | POA: Diagnosis not present

## 2020-06-24 DIAGNOSIS — L570 Actinic keratosis: Secondary | ICD-10-CM | POA: Diagnosis not present

## 2020-06-24 DIAGNOSIS — L821 Other seborrheic keratosis: Secondary | ICD-10-CM | POA: Diagnosis not present

## 2020-06-24 DIAGNOSIS — D0462 Carcinoma in situ of skin of left upper limb, including shoulder: Secondary | ICD-10-CM | POA: Diagnosis not present

## 2020-06-24 DIAGNOSIS — D225 Melanocytic nevi of trunk: Secondary | ICD-10-CM | POA: Diagnosis not present

## 2020-06-24 DIAGNOSIS — D692 Other nonthrombocytopenic purpura: Secondary | ICD-10-CM | POA: Diagnosis not present

## 2020-06-24 DIAGNOSIS — Z85828 Personal history of other malignant neoplasm of skin: Secondary | ICD-10-CM | POA: Diagnosis not present

## 2020-06-28 ENCOUNTER — Other Ambulatory Visit: Payer: Self-pay | Admitting: Internal Medicine

## 2020-06-28 MED ORDER — HYDROCODONE-ACETAMINOPHEN 5-325 MG PO TABS: 1.0000 | ORAL_TABLET | ORAL | 0 refills | Status: DC | PRN

## 2020-07-01 MED ORDER — INJECTAFER 750 MG/15ML IV SOLN
750.0000 mg | Freq: Once | INTRAVENOUS | 0 refills | Status: AC
Start: 2020-07-01 — End: 2020-07-01

## 2020-07-02 DIAGNOSIS — M9903 Segmental and somatic dysfunction of lumbar region: Secondary | ICD-10-CM | POA: Diagnosis not present

## 2020-07-03 DIAGNOSIS — D509 Iron deficiency anemia, unspecified: Secondary | ICD-10-CM | POA: Diagnosis not present

## 2020-07-07 DIAGNOSIS — M25561 Pain in right knee: Secondary | ICD-10-CM | POA: Diagnosis not present

## 2020-07-10 DIAGNOSIS — D509 Iron deficiency anemia, unspecified: Secondary | ICD-10-CM | POA: Diagnosis not present

## 2020-07-11 ENCOUNTER — Other Ambulatory Visit: Payer: Self-pay

## 2020-07-11 DIAGNOSIS — M25561 Pain in right knee: Secondary | ICD-10-CM | POA: Diagnosis not present

## 2020-07-11 MED ORDER — DILTIAZEM GEL 2 %: CUTANEOUS | 0 refills | Status: DC

## 2020-07-11 NOTE — Telephone Encounter (Signed)
Diltiazem gel rx faxed to [place] and patient notifed via [facility].

## 2020-07-14 ENCOUNTER — Other Ambulatory Visit: Payer: Self-pay | Admitting: Internal Medicine

## 2020-07-14 DIAGNOSIS — Z7960 Long term (current) use of unspecified immunomodulators and immunosuppressants: Secondary | ICD-10-CM

## 2020-07-14 DIAGNOSIS — Z79899 Other long term (current) drug therapy: Secondary | ICD-10-CM

## 2020-07-14 DIAGNOSIS — K50813 Crohn's disease of both small and large intestine with fistula: Secondary | ICD-10-CM

## 2020-07-14 DIAGNOSIS — K5090 Crohn's disease, unspecified, without complications: Secondary | ICD-10-CM | POA: Diagnosis not present

## 2020-07-15 ENCOUNTER — Other Ambulatory Visit: Payer: Self-pay | Admitting: Internal Medicine

## 2020-07-15 DIAGNOSIS — L988 Other specified disorders of the skin and subcutaneous tissue: Secondary | ICD-10-CM | POA: Diagnosis not present

## 2020-07-15 DIAGNOSIS — D485 Neoplasm of uncertain behavior of skin: Secondary | ICD-10-CM | POA: Diagnosis not present

## 2020-07-15 DIAGNOSIS — Z85828 Personal history of other malignant neoplasm of skin: Secondary | ICD-10-CM | POA: Diagnosis not present

## 2020-07-15 DIAGNOSIS — S81812A Laceration without foreign body, left lower leg, initial encounter: Secondary | ICD-10-CM | POA: Diagnosis not present

## 2020-07-16 DIAGNOSIS — M25561 Pain in right knee: Secondary | ICD-10-CM | POA: Diagnosis not present

## 2020-07-18 ENCOUNTER — Other Ambulatory Visit: Payer: Self-pay | Admitting: Internal Medicine

## 2020-07-18 ENCOUNTER — Other Ambulatory Visit: Payer: Self-pay

## 2020-07-18 MED ORDER — PREDNISONE 20 MG PO TABS: ORAL_TABLET | ORAL | 1 refills | Status: DC

## 2020-07-18 NOTE — Telephone Encounter (Signed)
Prednisone refilled as approved.

## 2020-07-23 DIAGNOSIS — M9903 Segmental and somatic dysfunction of lumbar region: Secondary | ICD-10-CM | POA: Diagnosis not present

## 2020-07-29 ENCOUNTER — Other Ambulatory Visit: Payer: Self-pay | Admitting: Internal Medicine

## 2020-07-29 MED ORDER — HYDROCODONE-ACETAMINOPHEN 5-325 MG PO TABS: 1.0000 | ORAL_TABLET | ORAL | 0 refills | Status: DC | PRN

## 2020-07-31 ENCOUNTER — Other Ambulatory Visit: Payer: Self-pay | Admitting: Internal Medicine

## 2020-07-31 DIAGNOSIS — Z79899 Other long term (current) drug therapy: Secondary | ICD-10-CM

## 2020-07-31 DIAGNOSIS — Z7960 Long term (current) use of unspecified immunomodulators and immunosuppressants: Secondary | ICD-10-CM

## 2020-07-31 DIAGNOSIS — D500 Iron deficiency anemia secondary to blood loss (chronic): Secondary | ICD-10-CM

## 2020-07-31 DIAGNOSIS — K50813 Crohn's disease of both small and large intestine with fistula: Secondary | ICD-10-CM

## 2020-07-31 DIAGNOSIS — K754 Autoimmune hepatitis: Secondary | ICD-10-CM

## 2020-08-06 DIAGNOSIS — M9903 Segmental and somatic dysfunction of lumbar region: Secondary | ICD-10-CM | POA: Diagnosis not present

## 2020-08-08 ENCOUNTER — Other Ambulatory Visit (INDEPENDENT_AMBULATORY_CARE_PROVIDER_SITE_OTHER): Payer: Medicare Other

## 2020-08-08 DIAGNOSIS — K754 Autoimmune hepatitis: Secondary | ICD-10-CM | POA: Diagnosis not present

## 2020-08-08 DIAGNOSIS — K50813 Crohn's disease of both small and large intestine with fistula: Secondary | ICD-10-CM

## 2020-08-08 DIAGNOSIS — D500 Iron deficiency anemia secondary to blood loss (chronic): Secondary | ICD-10-CM | POA: Diagnosis not present

## 2020-08-08 DIAGNOSIS — Z7960 Long term (current) use of unspecified immunomodulators and immunosuppressants: Secondary | ICD-10-CM

## 2020-08-08 DIAGNOSIS — Z79899 Other long term (current) drug therapy: Secondary | ICD-10-CM | POA: Diagnosis not present

## 2020-08-08 LAB — CBC
HCT: 43.1 % (ref 39.0–52.0)
Hemoglobin: 14.5 g/dL (ref 13.0–17.0)
MCHC: 33.6 g/dL (ref 30.0–36.0)
MCV: 92.5 fl (ref 78.0–100.0)
Platelets: 129 10*3/uL — ABNORMAL LOW (ref 150.0–400.0)
RBC: 4.66 Mil/uL (ref 4.22–5.81)
RDW: 18.1 % — ABNORMAL HIGH (ref 11.5–15.5)
WBC: 11.5 10*3/uL — ABNORMAL HIGH (ref 4.0–10.5)

## 2020-08-08 LAB — COMPREHENSIVE METABOLIC PANEL
ALT: 169 U/L — ABNORMAL HIGH (ref 0–53)
AST: 74 U/L — ABNORMAL HIGH (ref 0–37)
Albumin: 3.6 g/dL (ref 3.5–5.2)
Alkaline Phosphatase: 178 U/L — ABNORMAL HIGH (ref 39–117)
BUN: 23 mg/dL (ref 6–23)
CO2: 28 mEq/L (ref 19–32)
Calcium: 8.1 mg/dL — ABNORMAL LOW (ref 8.4–10.5)
Chloride: 107 mEq/L (ref 96–112)
Creatinine, Ser: 0.99 mg/dL (ref 0.40–1.50)
GFR: 84.71 mL/min (ref >60.00)
Glucose, Bld: 97 mg/dL (ref 70–99)
Potassium: 3.8 mEq/L (ref 3.5–5.1)
Sodium: 143 mEq/L (ref 135–145)
Total Bilirubin: 0.5 mg/dL (ref 0.2–1.2)
Total Protein: 5.9 g/dL — ABNORMAL LOW (ref 6.0–8.3)

## 2020-08-08 LAB — FERRITIN: Ferritin: 607.3 ng/mL — ABNORMAL HIGH (ref 22.0–322.0)

## 2020-08-09 LAB — IGG: IgG (Immunoglobin G), Serum: 394 mg/dL — ABNORMAL LOW (ref 600–1640)

## 2020-08-11 ENCOUNTER — Other Ambulatory Visit: Payer: Medicare Other

## 2020-08-11 ENCOUNTER — Ambulatory Visit (INDEPENDENT_AMBULATORY_CARE_PROVIDER_SITE_OTHER): Payer: Medicare Other | Admitting: Internal Medicine

## 2020-08-11 ENCOUNTER — Encounter: Payer: Self-pay | Admitting: Internal Medicine

## 2020-08-11 VITALS — BP 120/76 | HR 72 | Ht 73.0 in | Wt 168.0 lb

## 2020-08-11 DIAGNOSIS — K754 Autoimmune hepatitis: Secondary | ICD-10-CM | POA: Diagnosis not present

## 2020-08-11 DIAGNOSIS — D803 Selective deficiency of immunoglobulin G [IgG] subclasses: Secondary | ICD-10-CM | POA: Diagnosis not present

## 2020-08-11 DIAGNOSIS — K604 Rectal fistula, unspecified: Secondary | ICD-10-CM

## 2020-08-11 DIAGNOSIS — K50813 Crohn's disease of both small and large intestine with fistula: Secondary | ICD-10-CM

## 2020-08-11 DIAGNOSIS — Z79899 Other long term (current) drug therapy: Secondary | ICD-10-CM | POA: Diagnosis not present

## 2020-08-11 DIAGNOSIS — Z7960 Long term (current) use of unspecified immunomodulators and immunosuppressants: Secondary | ICD-10-CM

## 2020-08-11 DIAGNOSIS — D509 Iron deficiency anemia, unspecified: Secondary | ICD-10-CM | POA: Insufficient documentation

## 2020-08-11 NOTE — Patient Instructions (Addendum)
Your provider has requested that you go to the basement level for lab work before leaving today. Press "B" on the elevator. The lab is located at the first door on the left as you exit the elevator.  Due to recent changes in healthcare laws, you may see the results of your imaging and laboratory studies on MyChart before your provider has had a chance to review them.  We understand that in some cases there may be results that are confusing or concerning to you. Not all laboratory results come back in the same time frame and the provider may be waiting for multiple results in order to interpret others.  Please give [place] 48 hours in order for your provider to thoroughly review all the results before contacting the office for clarification of your results.   If you are age 31 or younger, your body mass index should be between 19-25. Your Body mass index is 22.16 kg/m. If this is out of the aformentioned range listed, please consider follow up with your Primary Care Provider.    The [place] [facility] providers would like to encourage you to use [facility] to communicate with providers for non-urgent requests or questions.  Due to long hold times on the telephone, sending your provider a message by [facility] may be a faster and more efficient way to get a response.  Please allow 48 business hours for a response.  Please remember that this is for non-urgent requests.   We are providing you info on Prisitne toilet paper spray.  I appreciate the opportunity to care for you. Silvano Rusk, MD, [facility]

## 2020-08-11 NOTE — Progress Notes (Signed)
strep       [name] 55 y.o. [birth year] [id number]  Assessment & Plan:   Encounter Diagnoses  Name Primary?   Crohn's disease of both small and large intestine with fistula ([name]) Yes   Autoimmune hepatitis-PSC overlap    Long-term use of immunosuppressant medication    Perirectal fistula and abscess    IgG deficiency ([name])      Overall [name] is stable to improved though his fistula may be acting up.  I do not see any signs of an abscess that is draining appropriately I do not think it needs another seton etc.  His LFTs are stable.  His IgG level is low which could be gastrointestinal losses perhaps.  I am going to work this up further with labs as below.  Once I get those results I will let him know what the next steps are.   Orders Placed This Encounter  Procedures   Immunoglobulins, QN, A/E/G/M   Diphtheria / Tetanus Antibody Panel   Complement, total   Haemophilius influenzae B Ab IgG   Strep Pneumoniae Antibody Serotypes   Strep pneumoniae 23 Serotypes IgG      Subjective:   Chief Complaint: Follow-up of Crohn's disease liver disease anemia  HPI [name] reports that overall life is a bit stressful and he gets frustrated at times but he is having no major issues with his gastrointestinal liver illnesses.  1 bowel movement a day typically with some blood.  His fistula is draining some bloody discharge as well but no pus or abscess changes.  He had labs that showed the following  Lab Results  Component Value Date   FERRITIN 607.3 (H) 08/08/2020    Recent Labs  Lab 08/08/20 1024  AST 74*  ALT 169*  ALKPHOS 178*  BILITOT 0.5  PROT 5.9*  ALBUMIN 3.6   Lab Results  Component Value Date   WBC 11.5 (H) 08/08/2020   HGB 14.5 08/08/2020   HCT 43.1 08/08/2020   MCV 92.5 08/08/2020   PLT 129.0 (L) 08/08/2020   Lab Results  Component Value Date   CREATININE 0.99 08/08/2020   BUN 23 08/08/2020   NA 143 08/08/2020   K 3.8 08/08/2020   CL 107  08/08/2020   CO2 28 08/08/2020     Wt Readings from Last 3 Encounters:  08/11/20 168 lb (76.2 kg)  03/13/20 163 lb (73.9 kg)  09/18/19 160 lb (72.6 kg)     Allergies  Allergen Reactions   Asacol [Mesalamine] Diarrhea    abd pain   Flagyl [Metronidazole] Diarrhea    abd pain   Amoxicillin Other (See Comments)    Lots of gas   Azathioprine Diarrhea    abd pain   Gluten Meal Diarrhea   Metoprolol Tartrate     Stomach upset   Mycophenolate Mofetil Diarrhea    abd pain   Other     NUTS; constipation   Tylenol [Acetaminophen] Other (See Comments)   Wheat Bran Diarrhea    Constipation; flatulence; abd pain   Tape Itching and Rash    Please use "paper" tape   Current Meds  Medication Sig   Ascorbic Acid (VITAMIN C) 1000 MG tablet Take 1,000 mg by mouth daily.   Cholecalciferol (VITAMIN D) 125 MCG (5000 UT) CAPS Take 5,000 Units by mouth daily.   clonazePAM (KLONOPIN) 1 MG tablet Take 1 mg by mouth 2 (two) times daily. Takes 1/2 tab (0.71m)   diltiazem 2 % GEL Apply a pea  size amount into rectum 2 times a day   dilTIAZem HCl POWD APPLY A PEA-SIZED AMOUNT TO RECTUM TWICE A DAY.   gabapentin (NEURONTIN) 600 MG tablet TAKE 1 TABLET BY MOUTH AT BEDTIME   HYDROcodone-acetaminophen (NORCO/VICODIN) 5-325 MG tablet Take 1 tablet by mouth every 4 (four) hours as needed for moderate pain or severe pain.   Melatonin 3 MG CAPS Take 3 mg by mouth at bedtime as needed.   predniSONE (DELTASONE) 20 MG tablet TAKE 1 AND 1/2 TABLETS (30 MG TOTAL) BY MOUTH DAILY WITH BREAKFAST.   traZODone (DESYREL) 50 MG tablet Take 1 tablet (50 mg total) by mouth at bedtime.   vedolizumab (ENTYVIO) 300 MG injection Inject 300 mg into the vein every 28 (twenty-eight) days.   Past Medical History:  Diagnosis Date   Allergy    Anal fistula    Anxiety    Arthritis    ? of migratory arthritis   Autoimmune hepatitis ([place]) 01/19/2013   liver function checked every 2 or 3 months sees dr Carlean Purl    Avoidant-restrictive food intake disorder (ARFID) ? 06/15/2018   Cancer of skin of neck    Cataract    Chronic mesenteric ischemia ([name])    Chronic pain syndrome 07/09/2016   Crohn's disease of small and large intestines ([place])    followed by dr Glendell Docker Kendel Pesnell   Dairy product intolerance    Diarrhea, functional    Family history of adverse reaction to anesthesia    Grover's disease    transient acantholytic dermatosis   History of alcohol abuse    History of basal cell carcinoma excision    2013 left leg   History of Clostridium difficile    10/ 2014   History of multiple concussions    x6   last one Jan 2017 per pt--  no residual   History of substance abuse ([place])    quit 1997 per pt   History of suicide attempt    05-18-2012  overdose /  failure to thrive   Hypercholesterolemia    Iron deficiency anemia due to chronic blood loss    Major depression, recurrent, chronic ([place])    Osteopenia    Personal history of adenomatous colonic polyps 12/2010, 03/2012   12/2010 - 8 mm serrated adenoma of rectum   Portal vein thrombosis 03/21/2015   right   Primary sclerosing cholangitis    ? hepatitis overlap - liver bx x 2 and MRCP   Seasonal allergies    Substance abuse ([place]) 1997   Alcohol   Vitamin A deficiency 05/23/2018   Vitamin B6 deficiency 07/27/2018   Vitamin C deficiency 05/23/2018   Past Surgical History:  Procedure Laterality Date   ABDOMINAL AORTAGRAM N/A 03/29/2012   Procedure: ABDOMINAL Maxcine Ham;  Surgeon: Serafina Mitchell, MD;  Location: [facility] CATH LAB;  Service: Cardiovascular;  Laterality: N/A;   ADENOIDECTOMY  age 69   BIOPSY  05/31/2018   Procedure: BIOPSY;  Surgeon: Milus Banister, MD;  [facility]: [facility] ENDOSCOPY;  Service: Endoscopy;;   CATARACT EXTRACTION W/ INTRAOCULAR LENS  IMPLANT, BILATERAL  2009   COLONOSCOPY  2001, 05/02/2003, 01/28/11   2012: Right colon Crohn's, rectal polyp   COLONOSCOPY  03/31/2012   Procedure: COLONOSCOPY;  Surgeon: Jerene Bears, MD;  Location: [facility];  Service: Gastroenterology;  Laterality: N/A;   COLONOSCOPY WITH PROPOFOL N/A 05/31/2018   Procedure: COLONOSCOPY WITH PROPOFOL;  Surgeon: Milus Banister, MD;  Location: [facility] ENDOSCOPY;  Service: Endoscopy;  Laterality: N/A;  ESOPHAGOGASTRODUODENOSCOPY  01/28/11   Normal   FOOT SURGERY Right age 17   MOHS SURGERY Left 11/2013   left ankle parakerotosis    PERCUTANEOUS LIVER BIOPSY  2007 and 2008   PILONIDAL CYST EXCISION  age 65   [place] N/A 11/06/2015   Procedure: PLACEMENT OF SETON;  Surgeon: [name], MD;  Location: [facility];  Service: General;  Laterality: N/A;   PLACEMENT OF SETON  07/2018   at [place] N/A 01/18/2019   Procedure: ANAL EXAM UNDER ANESTHESIA,  INCISION AND DRAINAGE, SETON PLACEMENT;  Surgeon: [name], MD;  Location: [facility];  Service: General;  Laterality: N/A;   UPPER GASTROINTESTINAL ENDOSCOPY     Social History   Social History Narrative   Single, history of substance abuse in recovery   Previously occupied on medical disability   1 child  Daughter Theme park manager, lives and works in [employer]   Elderly parents involved and he helps them   1 brother in [place]   family history includes Allergies in his father; Breast cancer in his mother; Clotting disorder in his father; Heart disease in his father; Stomach cancer in his mother; Thyroid cancer in his father.   Review of Systems As above  Objective:   Physical Exam @BP  120/76   Pulse 72   Ht 6' 1"  (1.854 m)   Wt 168 lb (76.2 kg)   SpO2 97%   BMI 22.16 kg/m @  General:  NAD Eyes:   anicteric Lungs:  clear Heart::  S1S2 no rubs, murmurs or gallops Abdomen:  soft and nontender, BS+ Ext:   no edema, cyanosis or clubbing  Rectal exam reveals multiple surgical scars in the perianal area there is a mild stricture on digital exam, in the right posterior aspect there is a fistula that has a bit of  a bloody discharge.  Overall not significantly tender no mass lesion.  I had taken images but failed to save these.   Data Reviewed:  See HPI

## 2020-08-12 DIAGNOSIS — K5090 Crohn's disease, unspecified, without complications: Secondary | ICD-10-CM | POA: Diagnosis not present

## 2020-08-15 LAB — STREP PNEUMONIAE 23 SEROTYPES IGG
Serotype 17 (17F): 0.7
Serotype 2 (2): <0.3
Serotype 20 (20): 0.7
Serotype 22 (22F): <0.3
Serotype 34 (10A): 0.5
Serotype 43 (11A): <0.3
Serotype 54 (15B): <0.3
Serotype 57 (19A): <0.3
Serotype 70 (33F): 0.7
Strep pneumo Type 12: 1.6
Strep pneumo Type 19: 2.6
Strep pneumo Type 4: <0.3
Strep pneumo Type 9: 0.3
Strep pneumoniae Type 1 Abs: <0.3
Strep pneumoniae Type 14 Abs: <0.3
Strep pneumoniae Type 18C Abs: <0.3
Strep pneumoniae Type 23F Abs: <0.3
Strep pneumoniae Type 3 Abs: <0.3
Strep pneumoniae Type 5 Abs: <0.3
Strep pneumoniae Type 6B Abs: <0.3
Strep pneumoniae Type 7F Abs: 0.3
Strep pneumoniae Type 8 Abs: <0.3
Strep pneumoniae Type 9N Abs: <0.3

## 2020-08-15 LAB — IMMUNOGLOBULINS A/E/G/M, SERUM
IgA/Immunoglobulin A, Serum: 43 mg/dL — ABNORMAL LOW (ref 90–386)
IgE (Immunoglobulin E), Serum: 6 IU/mL (ref 6–495)
IgG (Immunoglobin G), Serum: 411 mg/dL — ABNORMAL LOW (ref 603–1613)
IgM (Immunoglobulin M), Srm: 45 mg/dL (ref 20–172)

## 2020-08-15 LAB — HAEMOPHILIUS INFLUENZAE B AB IGG: Influenza B Virus Ab, IgG: <0.15 ug/mL

## 2020-08-17 LAB — SERIAL MONITORING

## 2020-08-18 LAB — VEDOLIZUMAB AND ANTI-VEDO AB
Anti-Vedolizumab Antibody: <25 ng/mL
Vedolizumab: 24 ug/mL

## 2020-08-19 ENCOUNTER — Other Ambulatory Visit: Payer: Self-pay | Admitting: Internal Medicine

## 2020-08-19 ENCOUNTER — Telehealth: Payer: Self-pay | Admitting: Internal Medicine

## 2020-08-19 DIAGNOSIS — K50814 Crohn's disease of both small and large intestine with abscess: Secondary | ICD-10-CM

## 2020-08-19 DIAGNOSIS — Z23 Encounter for immunization: Secondary | ICD-10-CM | POA: Diagnosis not present

## 2020-08-19 DIAGNOSIS — H6982 Other specified disorders of Eustachian tube, left ear: Secondary | ICD-10-CM | POA: Diagnosis not present

## 2020-08-19 DIAGNOSIS — F411 Generalized anxiety disorder: Secondary | ICD-10-CM | POA: Diagnosis not present

## 2020-08-19 DIAGNOSIS — G4700 Insomnia, unspecified: Secondary | ICD-10-CM | POA: Diagnosis not present

## 2020-08-19 DIAGNOSIS — R768 Other specified abnormal immunological findings in serum: Secondary | ICD-10-CM

## 2020-08-19 NOTE — Telephone Encounter (Signed)
[name] called again. I told her that based on Dr. Celesta Aver message to patient, it does not seem like he decreased the frequency of pt's infusion. She is requesting to speak with Dr. Celesta Aver nurse. Pls call her at [phone].

## 2020-08-19 NOTE — Telephone Encounter (Signed)
Mary from [facility] requesting order to decrease Entyvio frequency to 8 weeks..   Fax#   [phone].Marland Kitchen  Plz advise   thanks

## 2020-08-19 NOTE — Telephone Encounter (Signed)
[name] please see note below and advise.

## 2020-08-19 NOTE — Telephone Encounter (Signed)
Dr Carlean Purl please advise if there has been a change in the pts entyvio dose or frequency of tx. See note below.

## 2020-08-20 LAB — DIPHTHERIA / TETANUS ANTIBODY PANEL
Diphtheria Ab: 0.03 IU/mL — ABNORMAL LOW
Tetanus Toxin Antibody, Total: 0.93 IU/mL

## 2020-08-20 LAB — COMPLEMENT, TOTAL: Compl, Total (CH50): >60 U/mL — ABNORMAL HIGH (ref 31–60)

## 2020-08-25 ENCOUNTER — Telehealth (HOSPITAL_COMMUNITY): Payer: Self-pay | Admitting: Psychiatry

## 2020-08-25 DIAGNOSIS — Z23 Encounter for immunization: Secondary | ICD-10-CM | POA: Diagnosis not present

## 2020-08-25 MED ORDER — ENTYVIO 300 MG IV SOLR: 300.0000 mg | INTRAVENOUS | 9 refills | Status: DC

## 2020-08-25 NOTE — Telephone Encounter (Signed)
Patieint calling to let provider know he no longer needs services for prescribing or refilling trazadone. Patient wants to say thank you for your service and he no longer needs additional services.

## 2020-08-25 NOTE — Telephone Encounter (Signed)
Thank you for the update!

## 2020-08-25 NOTE — Telephone Encounter (Signed)
Orders faxed

## 2020-08-28 ENCOUNTER — Other Ambulatory Visit: Payer: Self-pay | Admitting: Internal Medicine

## 2020-08-28 MED ORDER — HYDROCODONE-ACETAMINOPHEN 5-325 MG PO TABS: 1.0000 | ORAL_TABLET | ORAL | 0 refills | Status: DC | PRN

## 2020-08-28 NOTE — Telephone Encounter (Signed)
6 month refill

## 2020-09-02 DIAGNOSIS — M9903 Segmental and somatic dysfunction of lumbar region: Secondary | ICD-10-CM | POA: Diagnosis not present

## 2020-09-05 DIAGNOSIS — H43813 Vitreous degeneration, bilateral: Secondary | ICD-10-CM | POA: Diagnosis not present

## 2020-09-05 DIAGNOSIS — H26493 Other secondary cataract, bilateral: Secondary | ICD-10-CM | POA: Diagnosis not present

## 2020-09-05 DIAGNOSIS — H04123 Dry eye syndrome of bilateral lacrimal glands: Secondary | ICD-10-CM | POA: Diagnosis not present

## 2020-09-05 DIAGNOSIS — H52203 Unspecified astigmatism, bilateral: Secondary | ICD-10-CM | POA: Diagnosis not present

## 2020-09-17 ENCOUNTER — Ambulatory Visit: Payer: Self-pay | Admitting: Allergy and Immunology

## 2020-09-17 DIAGNOSIS — M9903 Segmental and somatic dysfunction of lumbar region: Secondary | ICD-10-CM | POA: Diagnosis not present

## 2020-09-23 DIAGNOSIS — T781XXA Other adverse food reactions, not elsewhere classified, initial encounter: Secondary | ICD-10-CM | POA: Diagnosis not present

## 2020-09-23 DIAGNOSIS — J309 Allergic rhinitis, unspecified: Secondary | ICD-10-CM | POA: Diagnosis not present

## 2020-09-23 DIAGNOSIS — Z87891 Personal history of nicotine dependence: Secondary | ICD-10-CM | POA: Diagnosis not present

## 2020-09-23 DIAGNOSIS — D849 Immunodeficiency, unspecified: Secondary | ICD-10-CM | POA: Diagnosis not present

## 2020-09-23 DIAGNOSIS — D8481 Immunodeficiency due to conditions classified elsewhere: Secondary | ICD-10-CM | POA: Diagnosis not present

## 2020-09-24 DIAGNOSIS — K5090 Crohn's disease, unspecified, without complications: Secondary | ICD-10-CM | POA: Diagnosis not present

## 2020-09-24 MED ORDER — DILTIAZEM GEL 2 %
CUTANEOUS | 0 refills | Status: DC
Start: 2020-09-24 — End: 2023-03-29

## 2020-09-29 ENCOUNTER — Other Ambulatory Visit: Payer: Self-pay | Admitting: Internal Medicine

## 2020-09-29 MED ORDER — HYDROCODONE-ACETAMINOPHEN 5-325 MG PO TABS: 1.0000 | ORAL_TABLET | ORAL | 0 refills | Status: DC | PRN

## 2020-10-01 DIAGNOSIS — M9903 Segmental and somatic dysfunction of lumbar region: Secondary | ICD-10-CM | POA: Diagnosis not present

## 2020-10-15 DIAGNOSIS — M9903 Segmental and somatic dysfunction of lumbar region: Secondary | ICD-10-CM | POA: Diagnosis not present

## 2020-10-22 ENCOUNTER — Other Ambulatory Visit: Payer: Self-pay | Admitting: Internal Medicine

## 2020-10-22 DIAGNOSIS — D500 Iron deficiency anemia secondary to blood loss (chronic): Secondary | ICD-10-CM

## 2020-10-22 DIAGNOSIS — K50814 Crohn's disease of both small and large intestine with abscess: Secondary | ICD-10-CM

## 2020-10-22 DIAGNOSIS — K754 Autoimmune hepatitis: Secondary | ICD-10-CM

## 2020-10-23 ENCOUNTER — Other Ambulatory Visit (INDEPENDENT_AMBULATORY_CARE_PROVIDER_SITE_OTHER): Payer: Medicare Other

## 2020-10-23 ENCOUNTER — Other Ambulatory Visit: Payer: Self-pay

## 2020-10-23 DIAGNOSIS — D500 Iron deficiency anemia secondary to blood loss (chronic): Secondary | ICD-10-CM

## 2020-10-23 DIAGNOSIS — K754 Autoimmune hepatitis: Secondary | ICD-10-CM | POA: Diagnosis not present

## 2020-10-23 DIAGNOSIS — K50814 Crohn's disease of both small and large intestine with abscess: Secondary | ICD-10-CM

## 2020-10-23 LAB — COMPREHENSIVE METABOLIC PANEL
ALT: 160 U/L — ABNORMAL HIGH (ref 0–53)
AST: 69 U/L — ABNORMAL HIGH (ref 0–37)
Albumin: 3.3 g/dL — ABNORMAL LOW (ref 3.5–5.2)
Alkaline Phosphatase: 207 U/L — ABNORMAL HIGH (ref 39–117)
BUN: 25 mg/dL — ABNORMAL HIGH (ref 6–23)
CO2: 28 mEq/L (ref 19–32)
Calcium: 8.3 mg/dL — ABNORMAL LOW (ref 8.4–10.5)
Chloride: 105 mEq/L (ref 96–112)
Creatinine, Ser: 1.2 mg/dL (ref 0.40–1.50)
GFR: 67.15 mL/min (ref >60.00)
Glucose, Bld: 138 mg/dL — ABNORMAL HIGH (ref 70–99)
Potassium: 4.1 mEq/L (ref 3.5–5.1)
Sodium: 141 mEq/L (ref 135–145)
Total Bilirubin: 0.4 mg/dL (ref 0.2–1.2)
Total Protein: 5.5 g/dL — ABNORMAL LOW (ref 6.0–8.3)

## 2020-10-23 LAB — FERRITIN: Ferritin: 95 ng/mL (ref 22.0–322.0)

## 2020-10-23 LAB — CBC
HCT: 42.6 % (ref 39.0–52.0)
Hemoglobin: 14.1 g/dL (ref 13.0–17.0)
MCHC: 33 g/dL (ref 30.0–36.0)
MCV: 95.7 fl (ref 78.0–100.0)
Platelets: 124 10*3/uL — ABNORMAL LOW (ref 150.0–400.0)
RBC: 4.45 Mil/uL (ref 4.22–5.81)
RDW: 14.6 % (ref 11.5–15.5)
WBC: 9 10*3/uL (ref 4.0–10.5)

## 2020-10-27 ENCOUNTER — Other Ambulatory Visit: Payer: Self-pay | Admitting: Internal Medicine

## 2020-10-27 ENCOUNTER — Telehealth: Payer: Self-pay | Admitting: Internal Medicine

## 2020-10-27 DIAGNOSIS — K754 Autoimmune hepatitis: Secondary | ICD-10-CM

## 2020-10-27 DIAGNOSIS — K50814 Crohn's disease of both small and large intestine with abscess: Secondary | ICD-10-CM

## 2020-10-27 NOTE — Telephone Encounter (Signed)
Patient is returning your call.  

## 2020-10-27 NOTE — Telephone Encounter (Signed)
[name] set up an appointment for early October with Dr Carlean Purl and he got my message about the lab results and plans and about Amy Esterwood PA-C refilling his pain rx in Dr Celesta Aver absence.

## 2020-10-28 ENCOUNTER — Other Ambulatory Visit: Payer: Self-pay | Admitting: Physician Assistant

## 2020-10-28 DIAGNOSIS — K50919 Crohn's disease, unspecified, with unspecified complications: Secondary | ICD-10-CM

## 2020-10-28 MED ORDER — HYDROCODONE-ACETAMINOPHEN 5-325 MG PO TABS: 1.0000 | ORAL_TABLET | ORAL | Status: DC | PRN

## 2020-10-28 MED ORDER — HYDROCODONE-ACETAMINOPHEN 5-325 MG PO TABS: 1.0000 | ORAL_TABLET | Freq: Four times a day (QID) | ORAL | Status: DC | PRN

## 2020-10-29 ENCOUNTER — Other Ambulatory Visit: Payer: Self-pay | Admitting: Physician Assistant

## 2020-10-29 DIAGNOSIS — M9903 Segmental and somatic dysfunction of lumbar region: Secondary | ICD-10-CM | POA: Diagnosis not present

## 2020-10-29 MED ORDER — HYDROCODONE-ACETAMINOPHEN 5-325 MG PO TABS: 1.0000 | ORAL_TABLET | ORAL | 0 refills | Status: DC | PRN

## 2020-11-05 DIAGNOSIS — K5090 Crohn's disease, unspecified, without complications: Secondary | ICD-10-CM | POA: Diagnosis not present

## 2020-11-11 ENCOUNTER — Ambulatory Visit: Payer: Self-pay | Admitting: Allergy and Immunology

## 2020-11-12 ENCOUNTER — Other Ambulatory Visit (INDEPENDENT_AMBULATORY_CARE_PROVIDER_SITE_OTHER): Payer: Medicare Other

## 2020-11-12 DIAGNOSIS — K754 Autoimmune hepatitis: Secondary | ICD-10-CM

## 2020-11-12 DIAGNOSIS — K50814 Crohn's disease of both small and large intestine with abscess: Secondary | ICD-10-CM | POA: Diagnosis not present

## 2020-11-12 DIAGNOSIS — M9903 Segmental and somatic dysfunction of lumbar region: Secondary | ICD-10-CM | POA: Diagnosis not present

## 2020-11-12 LAB — COMPREHENSIVE METABOLIC PANEL
ALT: 209 U/L — ABNORMAL HIGH (ref 0–53)
AST: 133 U/L — ABNORMAL HIGH (ref 0–37)
Albumin: 3.2 g/dL — ABNORMAL LOW (ref 3.5–5.2)
Alkaline Phosphatase: 261 U/L — ABNORMAL HIGH (ref 39–117)
BUN: 22 mg/dL (ref 6–23)
CO2: 31 mEq/L (ref 19–32)
Calcium: 8.5 mg/dL (ref 8.4–10.5)
Chloride: 103 mEq/L (ref 96–112)
Creatinine, Ser: 1.19 mg/dL (ref 0.40–1.50)
GFR: 67.8 mL/min (ref >60.00)
Glucose, Bld: 109 mg/dL — ABNORMAL HIGH (ref 70–99)
Potassium: 3.7 mEq/L (ref 3.5–5.1)
Sodium: 139 mEq/L (ref 135–145)
Total Bilirubin: 0.5 mg/dL (ref 0.2–1.2)
Total Protein: 5.5 g/dL — ABNORMAL LOW (ref 6.0–8.3)

## 2020-11-12 LAB — CBC WITH DIFFERENTIAL/PLATELET
Basophils Absolute: 0 10*3/uL (ref 0.0–0.1)
Basophils Relative: 0.2 % (ref 0.0–3.0)
Eosinophils Absolute: 0.1 10*3/uL (ref 0.0–0.7)
Eosinophils Relative: 0.9 % (ref 0.0–5.0)
HCT: 43.9 % (ref 39.0–52.0)
Hemoglobin: 14.6 g/dL (ref 13.0–17.0)
Lymphocytes Relative: 11.6 % — ABNORMAL LOW (ref 12.0–46.0)
Lymphs Abs: 1 10*3/uL (ref 0.7–4.0)
MCHC: 33.3 g/dL (ref 30.0–36.0)
MCV: 94.5 fl (ref 78.0–100.0)
Monocytes Absolute: 0.7 10*3/uL (ref 0.1–1.0)
Monocytes Relative: 8 % (ref 3.0–12.0)
Neutro Abs: 7.1 10*3/uL (ref 1.4–7.7)
Neutrophils Relative %: 79.3 % — ABNORMAL HIGH (ref 43.0–77.0)
Platelets: 131 10*3/uL — ABNORMAL LOW (ref 150.0–400.0)
RBC: 4.64 Mil/uL (ref 4.22–5.81)
RDW: 13.6 % (ref 11.5–15.5)
WBC: 9 10*3/uL (ref 4.0–10.5)

## 2020-11-12 LAB — FERRITIN: Ferritin: 150.5 ng/mL (ref 22.0–322.0)

## 2020-11-26 DIAGNOSIS — M9903 Segmental and somatic dysfunction of lumbar region: Secondary | ICD-10-CM | POA: Diagnosis not present

## 2020-11-27 ENCOUNTER — Other Ambulatory Visit: Payer: Self-pay | Admitting: Internal Medicine

## 2020-11-27 MED ORDER — HYDROCODONE-ACETAMINOPHEN 5-325 MG PO TABS: 1.0000 | ORAL_TABLET | ORAL | 0 refills | Status: DC | PRN

## 2020-12-09 ENCOUNTER — Other Ambulatory Visit (INDEPENDENT_AMBULATORY_CARE_PROVIDER_SITE_OTHER): Payer: Medicare Other

## 2020-12-09 ENCOUNTER — Encounter: Payer: Self-pay | Admitting: Internal Medicine

## 2020-12-09 ENCOUNTER — Ambulatory Visit (INDEPENDENT_AMBULATORY_CARE_PROVIDER_SITE_OTHER): Payer: Medicare Other | Admitting: Internal Medicine

## 2020-12-09 VITALS — BP 120/63 | HR 68 | Ht 73.0 in | Wt 175.4 lb

## 2020-12-09 DIAGNOSIS — D803 Selective deficiency of immunoglobulin G [IgG] subclasses: Secondary | ICD-10-CM | POA: Diagnosis not present

## 2020-12-09 DIAGNOSIS — K754 Autoimmune hepatitis: Secondary | ICD-10-CM | POA: Diagnosis not present

## 2020-12-09 DIAGNOSIS — K50813 Crohn's disease of both small and large intestine with fistula: Secondary | ICD-10-CM | POA: Diagnosis not present

## 2020-12-09 DIAGNOSIS — D500 Iron deficiency anemia secondary to blood loss (chronic): Secondary | ICD-10-CM

## 2020-12-09 DIAGNOSIS — Z7960 Long term (current) use of unspecified immunomodulators and immunosuppressants: Secondary | ICD-10-CM

## 2020-12-09 DIAGNOSIS — Z7952 Long term (current) use of systemic steroids: Secondary | ICD-10-CM | POA: Diagnosis not present

## 2020-12-09 LAB — CBC WITH DIFFERENTIAL/PLATELET
Basophils Absolute: 0 10*3/uL (ref 0.0–0.1)
Basophils Relative: 0.2 % (ref 0.0–3.0)
Eosinophils Absolute: 0 10*3/uL (ref 0.0–0.7)
Eosinophils Relative: 0 % (ref 0.0–5.0)
HCT: 45.1 % (ref 39.0–52.0)
Hemoglobin: 14.7 g/dL (ref 13.0–17.0)
Lymphocytes Relative: 6.5 % — ABNORMAL LOW (ref 12.0–46.0)
Lymphs Abs: 0.8 10*3/uL (ref 0.7–4.0)
MCHC: 32.5 g/dL (ref 30.0–36.0)
MCV: 94.7 fl (ref 78.0–100.0)
Monocytes Absolute: 0.8 10*3/uL (ref 0.1–1.0)
Monocytes Relative: 6.5 % (ref 3.0–12.0)
Neutro Abs: 10 10*3/uL — ABNORMAL HIGH (ref 1.4–7.7)
Neutrophils Relative %: 86.8 % — ABNORMAL HIGH (ref 43.0–77.0)
Platelets: 142 10*3/uL — ABNORMAL LOW (ref 150.0–400.0)
RBC: 4.76 Mil/uL (ref 4.22–5.81)
RDW: 14.1 % (ref 11.5–15.5)
WBC: 11.5 10*3/uL — ABNORMAL HIGH (ref 4.0–10.5)

## 2020-12-09 LAB — COMPREHENSIVE METABOLIC PANEL
ALT: 234 U/L — ABNORMAL HIGH (ref 0–53)
AST: 85 U/L — ABNORMAL HIGH (ref 0–37)
Albumin: 3.6 g/dL (ref 3.5–5.2)
Alkaline Phosphatase: 264 U/L — ABNORMAL HIGH (ref 39–117)
BUN: 25 mg/dL — ABNORMAL HIGH (ref 6–23)
CO2: 28 mEq/L (ref 19–32)
Calcium: 9 mg/dL (ref 8.4–10.5)
Chloride: 105 mEq/L (ref 96–112)
Creatinine, Ser: 1.14 mg/dL (ref 0.40–1.50)
GFR: 71.35 mL/min (ref >60.00)
Glucose, Bld: 109 mg/dL — ABNORMAL HIGH (ref 70–99)
Potassium: 4.1 mEq/L (ref 3.5–5.1)
Sodium: 142 mEq/L (ref 135–145)
Total Bilirubin: 0.4 mg/dL (ref 0.2–1.2)
Total Protein: 6.2 g/dL (ref 6.0–8.3)

## 2020-12-09 NOTE — Patient Instructions (Signed)
Your provider has requested that you go to the basement level for lab work before leaving today. Press "B" on the elevator. The lab is located at the first door on the left as you exit the elevator.  Due to recent changes in healthcare laws, you may see the results of your imaging and laboratory studies on MyChart before your provider has had a chance to review them.  We understand that in some cases there may be results that are confusing or concerning to you. Not all laboratory results come back in the same time frame and the provider may be waiting for multiple results in order to interpret others.  Please give [place] 48 hours in order for your provider to thoroughly review all the results before contacting the office for clarification of your results.   We are going to obtain your records from Dr Golda Acre.  We want to see you back in January.   I appreciate the opportunity to care for you. Silvano Rusk, MD, [facility]

## 2020-12-09 NOTE — Progress Notes (Signed)
[name] 55 y.o. 11/03/1965 [id number]  Assessment & Plan:   Encounter Diagnoses  Name Primary?   Crohn's disease of both small and large intestine with fistula ([place]) Yes   Autoimmune hepatitis ([place])    Long-term use of immunosuppressant medication    IgG deficiency ([place])    Anemia due to chronic blood loss    Long term current use of systemic steroids      All in all he seems to be doing reasonably well considering the multitude of problems he has. We will request records from [name] regarding IgG and other immunoglobulin deficiency so I can review those.  Continue current care try to reduce the prednisone back to 20 mg a day soon.  Many times we have been over the potential chronic issues with steroids but Chrystie Nose has never been able to wean these.  He will continue his Entyvio as well. I did not discuss today but I think we should consider repeating a colonoscopy.  I will communicate that to him.  Last performed April 2020.  He had Crohn's colitis flare ongoing at that time.  CC: [name], MD Dr. Fonnie Jarvis Bobbitt  Subjective:   Chief Complaint: Follow-up of Crohn's disease and autoimmune liver disease  HPI [name] is a 55 year old white man followed for many years with multiple problems that include Crohn's disease of the small and large intestine with fistulizing diseases and perianal abscesses status post incision and drainage and seton placements, and also an autoimmune hepatitis PSC overlap problem.  Chronic iron deficiency anemia requiring parenteral iron therapy intermittently.   Here today reporting that overall things are pretty good though he had to go up on his prednisone to 30 mg daily (he messaged me about this) because of itching and bloating and gas.  He has been on that for couple of days and feels somewhat better.  He has this skin condition called Grovers disease that comes and goes.  He is complaining about some traumatic lesions on  his forearms from working in the yard that are red and he says that he tends not to get these types of things.  He has a dermatology visit coming up with Dr. Elvera Lennox.  1 or 2 stools a day most days mostly formed.  No abscess issues in the perianal area though he leaks some stool through the fistula that he has where the seton was.  All in all things are pretty good there is not had to use his diltiazem.   His transaminases and alk phos remain elevated.  He has seen [name] of immunology and allergy because of low IgG.  [name] has communicated some of these results to me but I do not have those records.  He continues to get 30 hydrocodone a month to try to help with pain that he has and he has been compliant with that and not getting it from anywhere else and I have continued that.  I do not see any ill effects.  Wt Readings from Last 3 Encounters:  12/09/20 175 lb 6.4 oz (79.6 kg)  08/11/20 168 lb (76.2 kg)  03/13/20 163 lb (73.9 kg)     Allergies  Allergen Reactions   Asacol [Mesalamine] Diarrhea    abd pain   Flagyl [Metronidazole] Diarrhea    abd pain   Amoxicillin Other (See Comments)    Lots of gas   Azathioprine Diarrhea    abd pain   Gluten Meal Diarrhea   Metoprolol  Tartrate     Stomach upset   Mycophenolate Mofetil Diarrhea    abd pain   Other     NUTS; constipation   Tylenol [Acetaminophen] Other (See Comments)   Wheat Bran Diarrhea    Constipation; flatulence; abd pain   Tape Itching and Rash    Please use "paper" tape   Current Meds  Medication Sig   Ascorbic Acid (VITAMIN C) 1000 MG tablet Take 1,000 mg by mouth daily.   Cholecalciferol (VITAMIN D) 125 MCG (5000 UT) CAPS Take 5,000 Units by mouth daily.   clonazePAM (KLONOPIN) 1 MG tablet Take 1 mg by mouth 2 (two) times daily. Takes 1/2 tab (0.47m)   diltiazem 2 % GEL Apply a pea size amount into rectum 2 times a day   gabapentin (NEURONTIN) 600 MG tablet TAKE 1 TABLET BY MOUTH EVERYDAY AT BEDTIME    HYDROcodone-acetaminophen (NORCO/VICODIN) 5-325 MG tablet Take 1 tablet by mouth every 4 (four) hours as needed for moderate pain or severe pain.   Melatonin 3 MG CAPS Take 3 mg by mouth at bedtime as needed.   predniSONE (DELTASONE) 20 MG tablet TAKE 1 AND 1/2 TABLETS (30 MG TOTAL) BY MOUTH DAILY WITH BREAKFAST.   traZODone (DESYREL) 50 MG tablet Take 1 tablet (50 mg total) by mouth at bedtime.   vedolizumab (ENTYVIO) 300 MG injection Inject 300 mg into the vein every 6 (six) weeks.   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 06/15/2018   Cancer of skin of neck    Cataract    Chronic mesenteric ischemia ([name])    Chronic pain syndrome 07/09/2016   Crohn's disease of small and large intestines ([place]    followed by dr [name]   Dairy product intolerance    Diarrhea, functional    Family history of adverse reaction to anesthesia    Grover's disease    transient acantholytic dermatosis   History of alcohol abuse    History of basal cell carcinoma excision    2013 left leg   History of Clostridium difficile    10/ 2014   History of multiple concussions    x6   last one Jan 2017 per pt--  no residual   History of substance abuse ([place]    quit 1997 per pt   History of suicide attempt    05-18-2012  overdose /  failure to thrive   Hypercholesterolemia    Iron deficiency anemia due to chronic blood loss    Major depression, recurrent, chronic ([place]    Osteopenia    Personal history of adenomatous colonic polyps 12/2010, 03/2012   12/2010 - 8 mm serrated adenoma of rectum   Portal vein thrombosis 03/21/2015   right   Primary sclerosing cholangitis    ? hepatitis overlap - liver bx x 2 and MRCP   Seasonal allergies    Substance abuse ([place] 1997    Alcohol   Vitamin A deficiency 05/23/2018   Vitamin B6 deficiency 07/27/2018   Vitamin C deficiency 05/23/2018   Past Surgical History:  Procedure Laterality Date   ABDOMINAL AORTAGRAM N/A 03/29/2012   Procedure: ABDOMINAL AMaxcine Ham  Surgeon: VSerafina Mitchell MD;  [facility];  Service: Cardiovascular;  Laterality: N/A;   ADENOIDECTOMY  age 89   BIOPSY  05/31/2018   Procedure: BIOPSY;  Surgeon: Milus Banister, MD;  [facility];  Service: Endoscopy;;   CATARACT EXTRACTION W/ INTRAOCULAR LENS  IMPLANT, BILATERAL  2009   COLONOSCOPY  2001, 05/02/2003, 01/28/11   2012: Right colon Crohn's, rectal polyp   COLONOSCOPY  03/31/2012   Procedure: COLONOSCOPY;  Surgeon: Jerene Bears, MD;  Location: [place];  Service: Gastroenterology;  Laterality: N/A;   COLONOSCOPY WITH PROPOFOL N/A 05/31/2018   Procedure: COLONOSCOPY WITH PROPOFOL;  Surgeon: Milus Banister, MD;  [facility];  Service: Endoscopy;  Laterality: N/A;   ESOPHAGOGASTRODUODENOSCOPY  01/28/11   Normal   FOOT SURGERY Right age 89   MOHS SURGERY Left 11/2013   left ankle parakerotosis    PERCUTANEOUS LIVER BIOPSY  2007 and 2008   PILONIDAL CYST EXCISION  age 21   [place] N/A 11/06/2015   Procedure: PLACEMENT OF SETON;  Surgeon: [name], MD;  [facility];  Service: General;  Laterality: N/A;   PLACEMENT OF SETON  07/2018   at [place] N/A 01/18/2019   Procedure: ANAL EXAM UNDER ANESTHESIA,  INCISION AND DRAINAGE, SETON PLACEMENT;  Surgeon: [name], MD;  Location: [place];  Service: General;  Laterality: N/A;   UPPER GASTROINTESTINAL ENDOSCOPY     Social History   Social History Narrative   Single, history of substance abuse in recovery   Previously occupied on medical disability   1 child  Daughter Theme park manager, lives and works in [place]   Elderly parents involved and he helps them   1 brother in [place]   family history includes Allergies in his father; Breast cancer in his mother; Clotting disorder in his father; Heart disease in his father; Stomach cancer in his mother; Thyroid cancer in his father.   Review of Systems As per HPI  Objective:   Physical Exam BP 120/63   Pulse 68   Ht _0  (1.854 m)   Wt 175 lb 6.4 oz (79.6 kg)   SpO2 98%   BMI 23.14 kg/m  Chron ill NAD Lungs cta Cor NL S1S2 no rmg Abd soft and NT BS + Rectal perianal exam performed digital exam not performed today, the image below shows these changes there is an open fistulous tract where he used to have the seton on the right posterior aspect of the perianal area.  Feces come through this but no fluctuance or pus in these areas.  This area looks as good as it has in some time.    Skin papular rash on back, multiple traumatic appearing papulses forearms

## 2020-12-10 ENCOUNTER — Other Ambulatory Visit: Payer: Self-pay | Admitting: Internal Medicine

## 2020-12-10 DIAGNOSIS — D500 Iron deficiency anemia secondary to blood loss (chronic): Secondary | ICD-10-CM

## 2020-12-10 DIAGNOSIS — M9903 Segmental and somatic dysfunction of lumbar region: Secondary | ICD-10-CM | POA: Diagnosis not present

## 2020-12-10 DIAGNOSIS — K754 Autoimmune hepatitis: Secondary | ICD-10-CM

## 2020-12-10 LAB — IGG: IgG (Immunoglobin G), Serum: 538 mg/dL — ABNORMAL LOW (ref 600–1640)

## 2020-12-18 DIAGNOSIS — K5090 Crohn's disease, unspecified, without complications: Secondary | ICD-10-CM | POA: Diagnosis not present

## 2020-12-23 DIAGNOSIS — M9903 Segmental and somatic dysfunction of lumbar region: Secondary | ICD-10-CM | POA: Diagnosis not present

## 2020-12-24 DIAGNOSIS — C44619 Basal cell carcinoma of skin of left upper limb, including shoulder: Secondary | ICD-10-CM | POA: Diagnosis not present

## 2020-12-24 DIAGNOSIS — Z85828 Personal history of other malignant neoplasm of skin: Secondary | ICD-10-CM | POA: Diagnosis not present

## 2020-12-24 DIAGNOSIS — L565 Disseminated superficial actinic porokeratosis (DSAP): Secondary | ICD-10-CM | POA: Diagnosis not present

## 2020-12-24 DIAGNOSIS — L300 Nummular dermatitis: Secondary | ICD-10-CM | POA: Diagnosis not present

## 2020-12-24 DIAGNOSIS — L814 Other melanin hyperpigmentation: Secondary | ICD-10-CM | POA: Diagnosis not present

## 2020-12-24 DIAGNOSIS — L570 Actinic keratosis: Secondary | ICD-10-CM | POA: Diagnosis not present

## 2020-12-26 ENCOUNTER — Other Ambulatory Visit: Payer: Self-pay | Admitting: Internal Medicine

## 2020-12-26 DIAGNOSIS — F1190 Opioid use, unspecified, uncomplicated: Secondary | ICD-10-CM

## 2020-12-26 MED ORDER — HYDROCODONE-ACETAMINOPHEN 5-325 MG PO TABS: 1.0000 | ORAL_TABLET | ORAL | 0 refills | Status: DC | PRN

## 2020-12-28 ENCOUNTER — Other Ambulatory Visit: Payer: Self-pay | Admitting: Internal Medicine

## 2020-12-29 ENCOUNTER — Other Ambulatory Visit: Payer: Medicare Other

## 2020-12-29 DIAGNOSIS — F1190 Opioid use, unspecified, uncomplicated: Secondary | ICD-10-CM

## 2021-01-03 LAB — DRUG SCREEN 8 W/CONF, UR
Amphetamines, Urine: NEGATIVE ng/mL
BENZODIAZ UR QL: NEGATIVE ng/mL
Barbiturate screen, urine: NEGATIVE ng/mL
Buprenorphine, Urine: NEGATIVE ng/mL
Cannabinoid Quant, Ur: NEGATIVE ng/mL
Cocaine (Metab.): NEGATIVE ng/mL
Methadone Screen, Urine: NEGATIVE ng/mL

## 2021-01-03 LAB — OPIATES CONFIRMATION, URINE
CODEINE: NEGATIVE
Hydrocodone GC/MS Conf: 263 ng/mL
Hydrocodone: POSITIVE — AB
Hydromorphone: NEGATIVE
MORPHINE: NEGATIVE
Opiates: POSITIVE — AB

## 2021-01-06 DIAGNOSIS — M9903 Segmental and somatic dysfunction of lumbar region: Secondary | ICD-10-CM | POA: Diagnosis not present

## 2021-01-19 IMAGING — CT CT ABDOMEN AND PELVIS WITH CONTRAST
2 of 5 series · 15 of 46 positions shown, 17 images · IV contrast (OMNIPAQUE)
Comparison: 04/26/2018

CLINICAL DATA: Abdominal pain for several days

EXAM:
CT ABDOMEN AND PELVIS WITH CONTRAST
TECHNIQUE: Multidetector CT imaging of the abdomen and pelvis was performed
using the standard protocol following bolus administration of
intravenous contrast.
CONTRAST:  100mL OMNIPAQUE IOHEXOL 300 MG/ML  SOLN

[Series 2: axial st · axial · 0.79mm/px · z∈[-499,-44]mm · 12 of 107 slices shown, 14 images]
[im 8/107  soft-tissue]
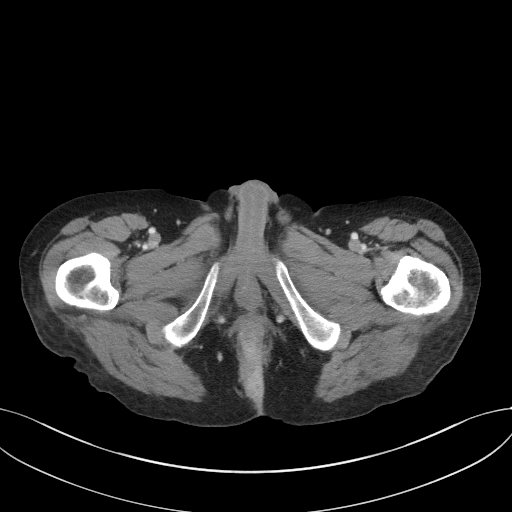
[im 8/107  bone]
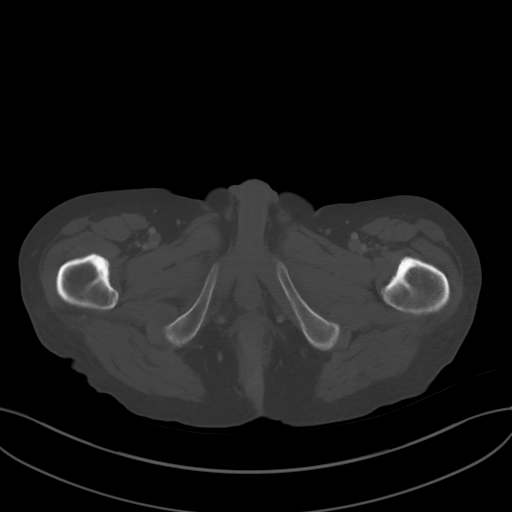
[im 16/107  soft-tissue]
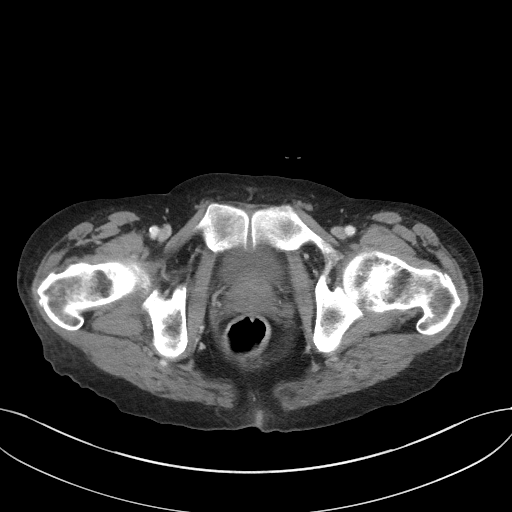
[im 23/107  soft-tissue]
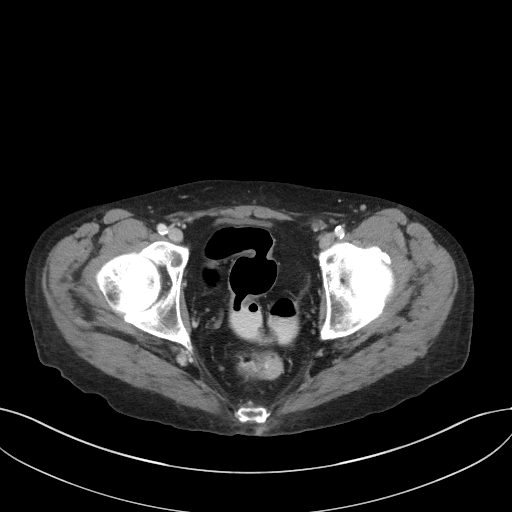
[im 31/107  soft-tissue]
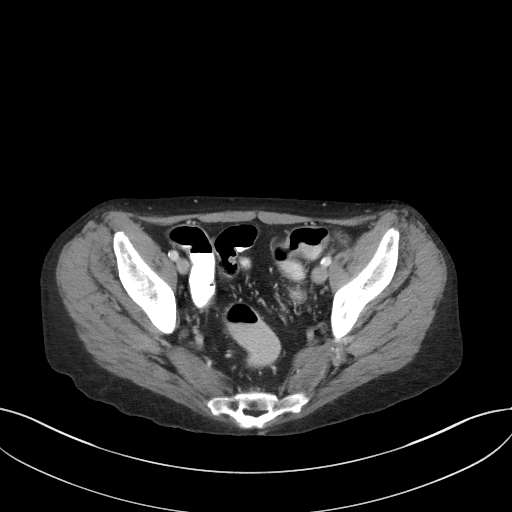
[im 38/107  soft-tissue]
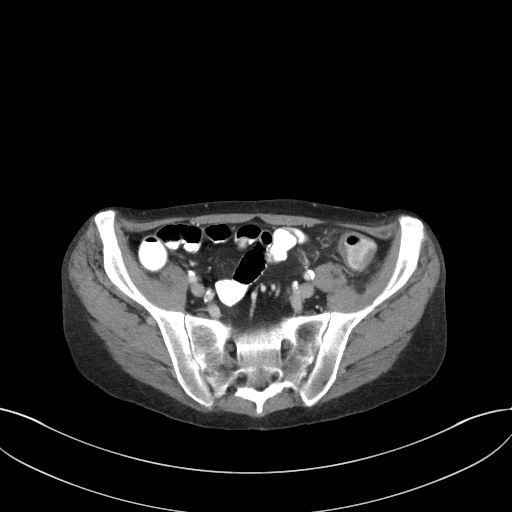
[im 46/107  soft-tissue]
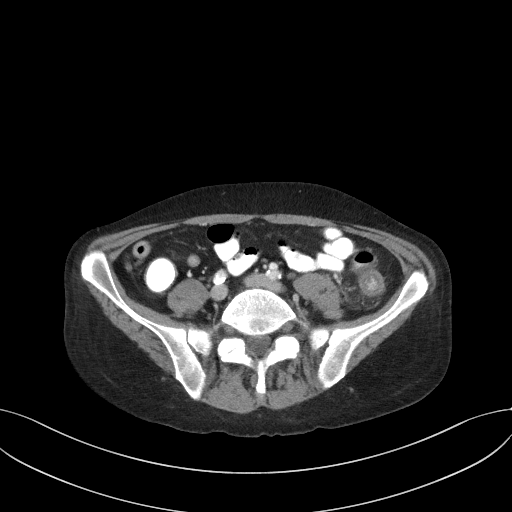
[im 61/107  soft-tissue]
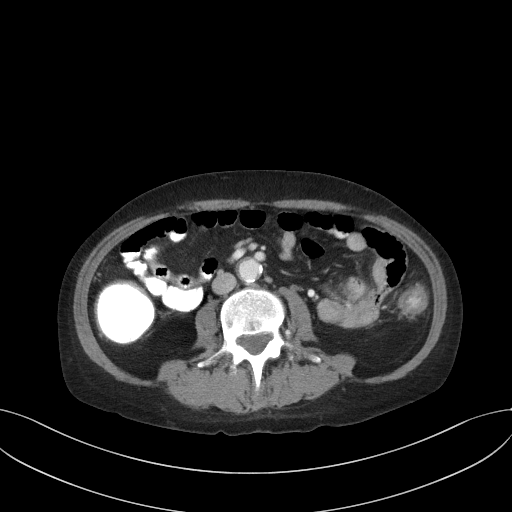
[im 69/107  soft-tissue]
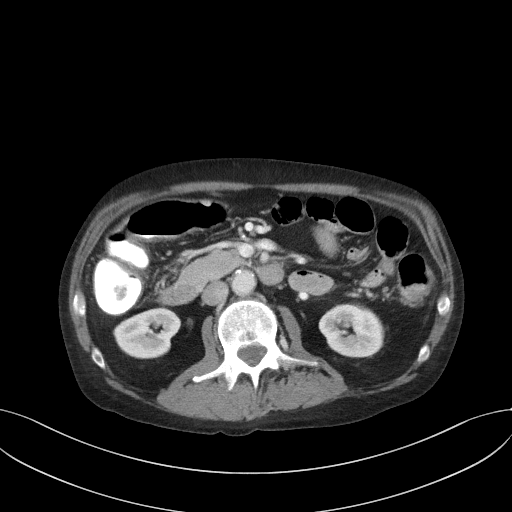
[im 76/107  soft-tissue]
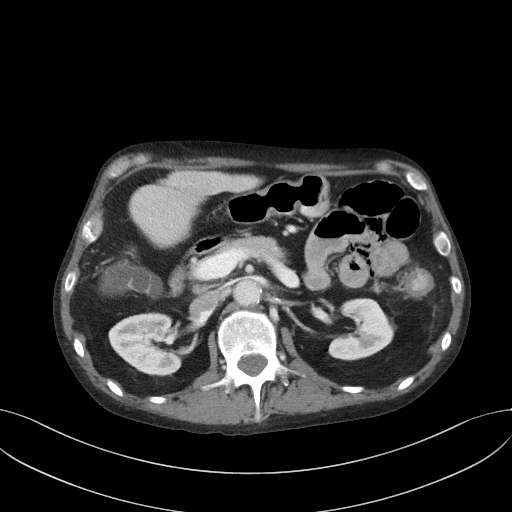
[im 76/107  bone]
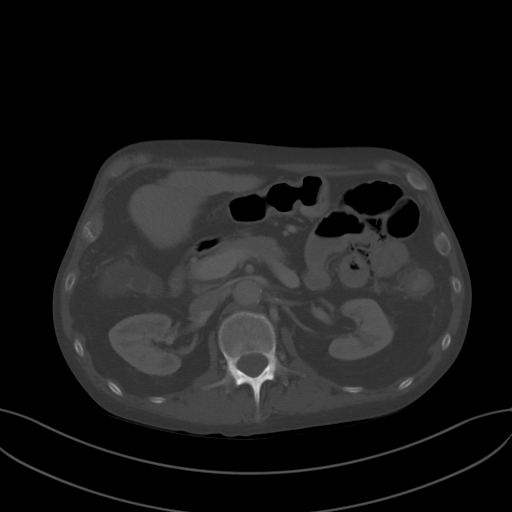
[im 84/107  soft-tissue]
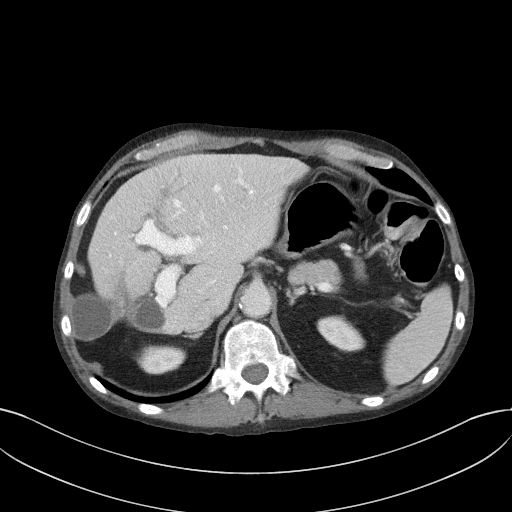
[im 91/107  soft-tissue]
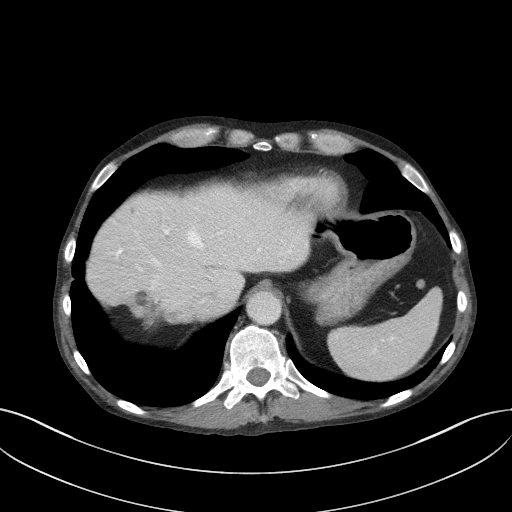
[im 99/107  soft-tissue]
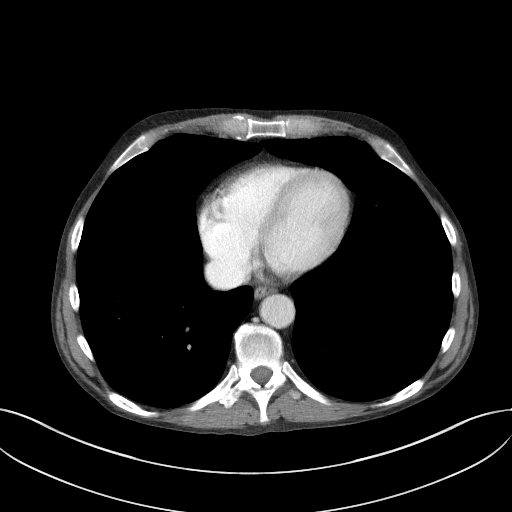

[Series 5: coronal st · coronal · 0.73mm/px · 3 of 90 slices shown]
[im 30/90  soft-tissue]
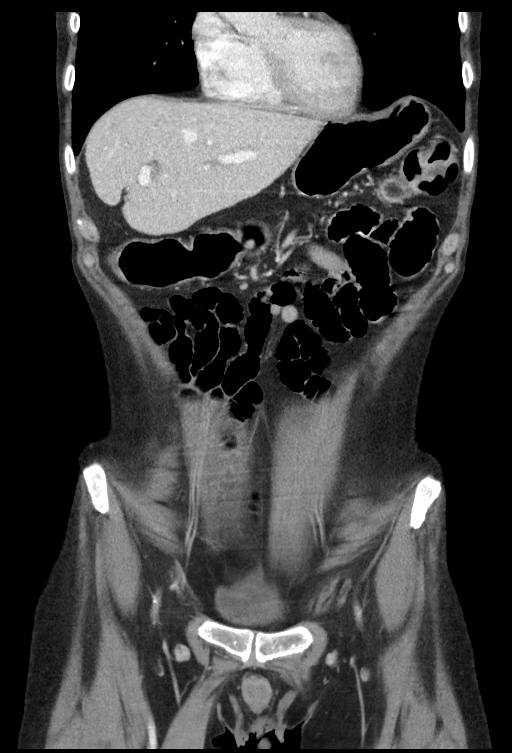
[im 40/90  soft-tissue]
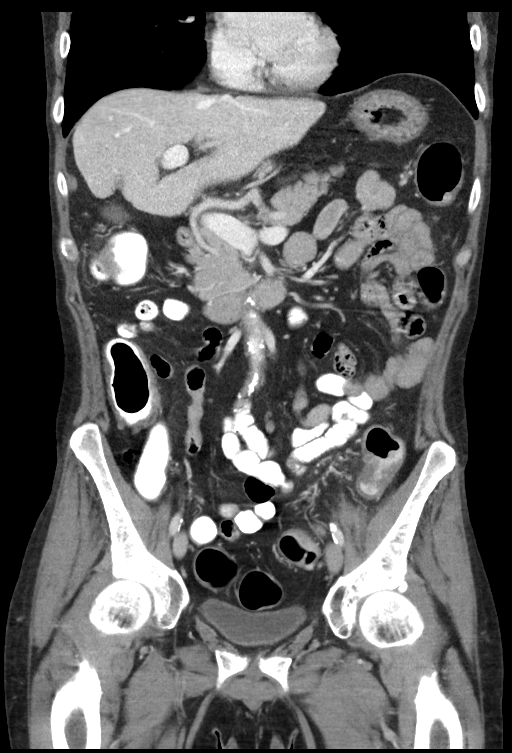
[im 50/90  soft-tissue]
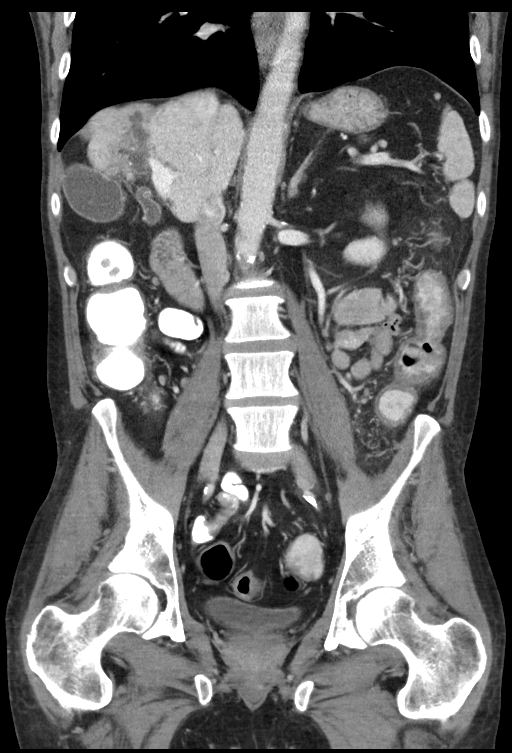

[15 of 46 positions shown; findings below may reference images not displayed]

FINDINGS: Lower chest: No acute abnormality.

Hepatobiliary: Gallbladder is within normal limits. The liver again
demonstrates an area of stable distortion within the right lobe
posteriorly. This is been stable dating back to 0873 related to
prior portal vein occlusion on the right. Stable ductal dilatation
in this region is noted when compare with the prior exam. Mild
ductal dilatation in the left lobe medially is noted and stable in
appearance. No definitive new focal mass is seen.

Pancreas: Unremarkable. No pancreatic ductal dilatation or
surrounding inflammatory changes.

Spleen: Normal in size without focal abnormality.

Adrenals/Urinary Tract: Adrenal glands are within normal limits
bilaterally. No renal calculi or obstructive changes are seen.
Normal enhancement and excretion of the kidneys is noted. The
bladder is partially distended.

Stomach/Bowel: The appendix is within normal limits inferior to the
cecum. There is some mild wall thickening noted in the descending
colon with mild pericolonic inflammatory changes. This may represent
some early focal colitis. No perforation or abscess formation is
seen. The stomach is within normal limits. No small bowel
abnormality is noted.

Vascular/Lymphatic: Aortic atherosclerosis. No enlarged abdominal or
pelvic lymph nodes.

Reproductive: Prostate is unremarkable.

Other: No abdominal wall hernia or abnormality. No abdominopelvic
ascites.

Musculoskeletal: No acute or significant osseous findings.
IMPRESSION: Mild wall thickening in the descending colon with pericolonic
inflammatory change consistent with early colitis.

Chronic changes within the liver related to prior portal vein
thrombosis on the right. This is stable from the prior exam and
dating back to 0873.

Resolution of previously seen fullness in the region of the cecum.

## 2021-01-23 ENCOUNTER — Encounter: Payer: Self-pay | Admitting: Internal Medicine

## 2021-01-27 ENCOUNTER — Other Ambulatory Visit: Payer: Self-pay | Admitting: Internal Medicine

## 2021-01-27 DIAGNOSIS — M9903 Segmental and somatic dysfunction of lumbar region: Secondary | ICD-10-CM | POA: Diagnosis not present

## 2021-01-27 MED ORDER — HYDROCODONE-ACETAMINOPHEN 5-325 MG PO TABS: 1.0000 | ORAL_TABLET | ORAL | 0 refills | Status: DC | PRN

## 2021-01-29 DIAGNOSIS — K5090 Crohn's disease, unspecified, without complications: Secondary | ICD-10-CM | POA: Diagnosis not present

## 2021-01-30 IMAGING — DX PORTABLE CHEST - 1 VIEW
1 series · 1 of 1 positions shown · non-contrast
Comparison: 07/19/2018

CLINICAL DATA: Weakness.  Altered mental status

EXAM:
PORTABLE CHEST 1 VIEW

[chest ap]
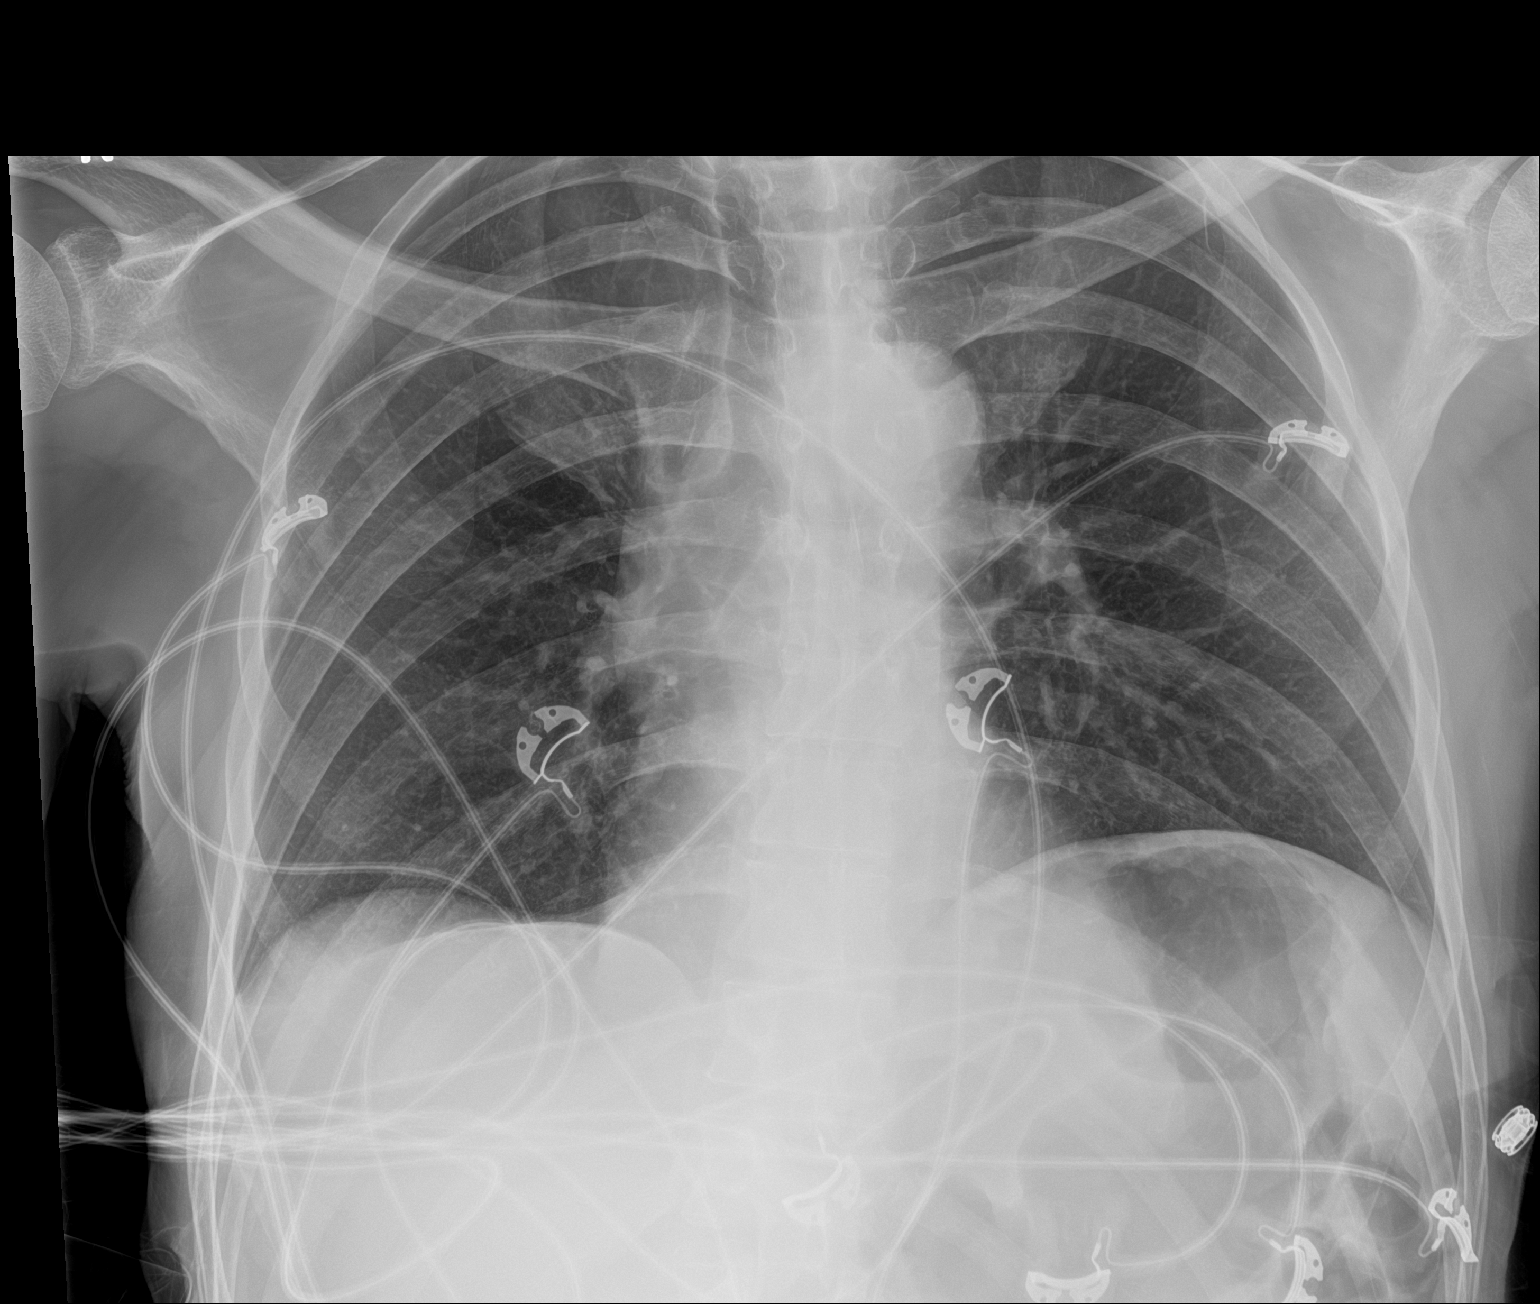

[1 of 1 positions shown; findings below may reference images not displayed]

FINDINGS: The heart size and mediastinal contours are within normal limits.
Both lungs are clear. The visualized skeletal structures are
unremarkable.
IMPRESSION: No active disease.

## 2021-02-08 ENCOUNTER — Encounter: Payer: Self-pay | Admitting: Internal Medicine

## 2021-02-08 DIAGNOSIS — Z79891 Long term (current) use of opiate analgesic: Secondary | ICD-10-CM | POA: Insufficient documentation

## 2021-02-09 ENCOUNTER — Encounter: Payer: Self-pay | Admitting: Internal Medicine

## 2021-02-10 ENCOUNTER — Other Ambulatory Visit: Payer: Self-pay | Admitting: Internal Medicine

## 2021-02-10 DIAGNOSIS — K754 Autoimmune hepatitis: Secondary | ICD-10-CM

## 2021-02-10 DIAGNOSIS — M9903 Segmental and somatic dysfunction of lumbar region: Secondary | ICD-10-CM | POA: Diagnosis not present

## 2021-02-10 DIAGNOSIS — K50814 Crohn's disease of both small and large intestine with abscess: Secondary | ICD-10-CM

## 2021-02-11 ENCOUNTER — Other Ambulatory Visit (INDEPENDENT_AMBULATORY_CARE_PROVIDER_SITE_OTHER): Payer: Medicare Other

## 2021-02-11 ENCOUNTER — Encounter: Payer: Self-pay | Admitting: Internal Medicine

## 2021-02-11 DIAGNOSIS — K754 Autoimmune hepatitis: Secondary | ICD-10-CM | POA: Diagnosis not present

## 2021-02-11 DIAGNOSIS — K50814 Crohn's disease of both small and large intestine with abscess: Secondary | ICD-10-CM

## 2021-02-11 LAB — CBC
HCT: 42.1 % (ref 39.0–52.0)
Hemoglobin: 13.7 g/dL (ref 13.0–17.0)
MCHC: 32.5 g/dL (ref 30.0–36.0)
MCV: 90.1 fl (ref 78.0–100.0)
Platelets: 168 10*3/uL (ref 150.0–400.0)
RBC: 4.67 Mil/uL (ref 4.22–5.81)
RDW: 14 % (ref 11.5–15.5)
WBC: 13.5 10*3/uL — ABNORMAL HIGH (ref 4.0–10.5)

## 2021-02-11 LAB — COMPREHENSIVE METABOLIC PANEL
ALT: 172 U/L — ABNORMAL HIGH (ref 0–53)
AST: 78 U/L — ABNORMAL HIGH (ref 0–37)
Albumin: 3.3 g/dL — ABNORMAL LOW (ref 3.5–5.2)
Alkaline Phosphatase: 210 U/L — ABNORMAL HIGH (ref 39–117)
BUN: 24 mg/dL — ABNORMAL HIGH (ref 6–23)
CO2: 29 mEq/L (ref 19–32)
Calcium: 8.9 mg/dL (ref 8.4–10.5)
Chloride: 104 mEq/L (ref 96–112)
Creatinine, Ser: 1.19 mg/dL (ref 0.40–1.50)
GFR: 67.69 mL/min (ref >60.00)
Glucose, Bld: 136 mg/dL — ABNORMAL HIGH (ref 70–99)
Potassium: 4.1 mEq/L (ref 3.5–5.1)
Sodium: 141 mEq/L (ref 135–145)
Total Bilirubin: 0.6 mg/dL (ref 0.2–1.2)
Total Protein: 5.8 g/dL — ABNORMAL LOW (ref 6.0–8.3)

## 2021-02-11 LAB — VITAMIN B12: Vitamin B-12: 485 pg/mL (ref 211–911)

## 2021-02-11 LAB — FERRITIN: Ferritin: 28.1 ng/mL (ref 22.0–322.0)

## 2021-02-12 LAB — IGG: IgG (Immunoglobin G), Serum: 512 mg/dL — ABNORMAL LOW (ref 600–1640)

## 2021-02-12 NOTE — Telephone Encounter (Signed)
Pt requesting orders to go to [facility] to get Iron Infusion.  Please advise

## 2021-02-13 ENCOUNTER — Encounter: Payer: Self-pay | Admitting: Internal Medicine

## 2021-02-16 ENCOUNTER — Other Ambulatory Visit: Payer: Self-pay

## 2021-02-16 DIAGNOSIS — D500 Iron deficiency anemia secondary to blood loss (chronic): Secondary | ICD-10-CM

## 2021-02-16 DIAGNOSIS — Z0000 Encounter for general adult medical examination without abnormal findings: Secondary | ICD-10-CM | POA: Diagnosis not present

## 2021-02-16 MED ORDER — INJECTAFER 750 MG/15ML IV SOLN: INTRAVENOUS | 1 refills | Status: DC

## 2021-02-16 NOTE — Telephone Encounter (Signed)
Prescription faxed to [facility] for Iron Infusion. Pt made aware. Pt scheduled for an appointment on 02/19/2021 @ 11:00 AM Pt made aware. Pt verbalized understanding with all questions answered.

## 2021-02-19 DIAGNOSIS — D509 Iron deficiency anemia, unspecified: Secondary | ICD-10-CM | POA: Diagnosis not present

## 2021-02-23 ENCOUNTER — Encounter: Payer: Self-pay | Admitting: Internal Medicine

## 2021-02-24 ENCOUNTER — Other Ambulatory Visit: Payer: Self-pay | Admitting: Nurse Practitioner

## 2021-02-25 ENCOUNTER — Other Ambulatory Visit: Payer: Self-pay | Admitting: Internal Medicine

## 2021-02-25 ENCOUNTER — Other Ambulatory Visit: Payer: Self-pay | Admitting: Gastroenterology

## 2021-02-25 MED ORDER — HYDROCODONE-ACETAMINOPHEN 5-325 MG PO TABS: 1.0000 | ORAL_TABLET | ORAL | 0 refills | Status: DC | PRN

## 2021-02-26 DIAGNOSIS — D509 Iron deficiency anemia, unspecified: Secondary | ICD-10-CM | POA: Diagnosis not present

## 2021-03-03 DIAGNOSIS — M9903 Segmental and somatic dysfunction of lumbar region: Secondary | ICD-10-CM | POA: Diagnosis not present

## 2021-03-05 DIAGNOSIS — D509 Iron deficiency anemia, unspecified: Secondary | ICD-10-CM | POA: Diagnosis not present

## 2021-03-05 DIAGNOSIS — M2550 Pain in unspecified joint: Secondary | ICD-10-CM | POA: Diagnosis not present

## 2021-03-05 DIAGNOSIS — K5090 Crohn's disease, unspecified, without complications: Secondary | ICD-10-CM | POA: Diagnosis not present

## 2021-03-05 DIAGNOSIS — Z79899 Other long term (current) drug therapy: Secondary | ICD-10-CM | POA: Diagnosis not present

## 2021-03-05 DIAGNOSIS — R748 Abnormal levels of other serum enzymes: Secondary | ICD-10-CM | POA: Diagnosis not present

## 2021-03-12 DIAGNOSIS — K5090 Crohn's disease, unspecified, without complications: Secondary | ICD-10-CM | POA: Diagnosis not present

## 2021-03-17 DIAGNOSIS — M9903 Segmental and somatic dysfunction of lumbar region: Secondary | ICD-10-CM | POA: Diagnosis not present

## 2021-03-20 ENCOUNTER — Ambulatory Visit (INDEPENDENT_AMBULATORY_CARE_PROVIDER_SITE_OTHER): Payer: Medicare Other | Admitting: Internal Medicine

## 2021-03-20 ENCOUNTER — Encounter: Payer: Self-pay | Admitting: Internal Medicine

## 2021-03-20 VITALS — BP 146/72 | HR 67 | Ht 73.0 in | Wt 182.0 lb

## 2021-03-20 DIAGNOSIS — K50813 Crohn's disease of both small and large intestine with fistula: Secondary | ICD-10-CM | POA: Diagnosis not present

## 2021-03-20 DIAGNOSIS — K8301 Primary sclerosing cholangitis: Secondary | ICD-10-CM

## 2021-03-20 DIAGNOSIS — Z7952 Long term (current) use of systemic steroids: Secondary | ICD-10-CM

## 2021-03-20 DIAGNOSIS — Z7960 Long term (current) use of unspecified immunomodulators and immunosuppressants: Secondary | ICD-10-CM

## 2021-03-20 DIAGNOSIS — F1190 Opioid use, unspecified, uncomplicated: Secondary | ICD-10-CM

## 2021-03-20 DIAGNOSIS — K754 Autoimmune hepatitis: Secondary | ICD-10-CM

## 2021-03-20 DIAGNOSIS — D803 Selective deficiency of immunoglobulin G [IgG] subclasses: Secondary | ICD-10-CM | POA: Diagnosis not present

## 2021-03-20 NOTE — Progress Notes (Signed)
[name] 56 y.o. [birth year] [id number]  Assessment & Plan:   Encounter Diagnoses  Name Primary?   Crohn's disease of both small and large intestine with fistula ([name]) Yes   Long-term use of immunosuppressant medication    Autoimmune hepatitis ([name])    IgG deficiency ([name])    Primary sclerosing cholangitis    Chronic, continuous use of opioids    Long term current use of systemic steroids    Evaluate Crohn's disease with repeat colonoscopy.  Continue Entyvio every 6 weeks.  Continue follow-up [name] regarding IgG deficiency issues.   He needs an MRCP again we will wait for the colonoscopy to be done.  I want to follow-up his liver disease.  It has been difficult to ever get his steroid dose below 20 mg currently at 30.  Await colonoscopy, will try to adjust this downward again depending upon those results.  He is compliant with his 30 hydrocodone every month we will continue that.  Follow-up to be determined pending the above.  He says he is having labs at [name] office soon and I have asked him to have those forwarded to me.  CC: [name] Cruel, MD Dr. Edgar [name]  Subjective:   Chief Complaint: Follow-up of ulcerative colitis and autoimmune liver disease (PSC hepatitis overlap)  HPI  56 year old white man with a history of Crohn's disease of the large and small intestine, related iron deficiency anemia, fistulizing disease problems, autoimmune hepatitis PSC overlap syndrome and depression who is here for follow-up.  His bowels are formed occasional rectal bleeding as he has had over the years, he drains stool from his chronic fistula status post seton but overall he thinks things are going pretty well with respect to his Crohn's.  His parents are declining in health his father's memory is failing and that is stressful.  Though his mood is down and he feels some sense of doom he is not suicidal as he has been in the past.  He continues to use  1 hydrocodone a day as he has for many years and has been compliant with testing and follow-up in therapy and no abuse.  He is on prednisone 30 mg daily.  He continues on Entyvio every 6 weeks.  Last colonoscopy 2020 with ileitis and colitis seen and pseudopolyps.  Yes chronically elevated LFTs last checked on December 14 is alk phos 210 AST 78 ALT 172 with total protein 5.8 and albumin 3.3.  IgG is low as it has been 512.  B12 was 45 and ferritin 28.1 and he [facility].  Hemoglobin was normal at 13.7 at that time.  White blood cell count 13.5 on prednisone.  Last MRCP February 2021.  Stable abnormalities. Allergies  Allergen Reactions   Asacol [Mesalamine] Diarrhea    abd pain   Flagyl [Metronidazole] Diarrhea    abd pain   Amoxicillin Other (See Comments)    Lots of gas   Azathioprine Diarrhea    abd pain   Gluten Meal Diarrhea   Metoprolol Tartrate     Stomach upset   Mycophenolate Mofetil Diarrhea    abd pain   Other     NUTS; constipation   Tylenol [Acetaminophen] Other (See Comments)   Wheat Bran Diarrhea    Constipation; flatulence; abd pain   Tape Itching and Rash    Please use "paper" tape   Current Meds  Medication Sig   Ascorbic Acid (VITAMIN C) 1000 MG tablet Take 1,000  mg by mouth daily.   Cholecalciferol (VITAMIN D) 125 MCG (5000 UT) CAPS Take 5,000 Units by mouth daily.   clonazePAM (KLONOPIN) 1 MG tablet Take 1 mg by mouth 2 (two) times daily. Takes 1/2 tab (0.53m)   diltiazem 2 % GEL Apply a pea size amount into rectum 2 times a day (Patient taking differently: Apply a pea size amount into rectum prn)   ferric carboxymaltose (INJECTAFER) 750 MG/15ML SOLN injection Infuse Day 1 and Day 7   gabapentin (NEURONTIN) 600 MG tablet TAKE 1 TABLET BY MOUTH EVERYDAY AT BEDTIME   HYDROcodone-acetaminophen (NORCO/VICODIN) 5-325 MG tablet Take 1 tablet by mouth every 4 (four) hours as needed for moderate pain or severe pain.   Melatonin 3  MG CAPS Take 3 mg by mouth at bedtime as needed.   predniSONE (DELTASONE) 20 MG tablet TAKE 1 AND 1/2 TABLETS (30 MG TOTAL) BY MOUTH DAILY WITH BREAKFAST.   vedolizumab (ENTYVIO) 300 MG injection Inject 300 mg into the vein every 6 (six) weeks.   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 06/15/2018   Cancer of skin of neck    Cataract    Chronic mesenteric ischemia ([name])    Chronic pain syndrome 07/09/2016   Crohn's disease of small and large intestines ([place]    followed by dr [name]   Dairy product intolerance    Diarrhea, functional    Family history of adverse reaction to anesthesia    Grover's disease    transient acantholytic dermatosis   History of alcohol abuse    History of basal cell carcinoma excision    2013 left leg   History of Clostridium difficile    10/ 2014   History of multiple concussions    x6   last one Jan 2017 per pt--  no residual   History of substance abuse ([place]    quit 1997 per pt   History of suicide attempt    05-18-2012  overdose /  failure to thrive   Hypercholesterolemia    Iron deficiency anemia due to chronic blood loss    Major depression, recurrent, chronic ([place]    Osteopenia    Personal history of adenomatous colonic polyps 12/2010, 03/2012   12/2010 - 8 mm serrated adenoma of rectum   Portal vein thrombosis 03/21/2015   right   Primary sclerosing cholangitis    ? hepatitis overlap - liver bx x 2 and MRCP   Seasonal allergies    Substance abuse ([place] 1997   Alcohol   Vitamin A deficiency 05/23/2018   Vitamin B6 deficiency 07/27/2018   Vitamin C deficiency 05/23/2018   Past Surgical History:  Procedure Laterality Date   ABDOMINAL AORTAGRAM N/A 03/29/2012   Procedure: ABDOMINAL AMaxcine Ham  Surgeon:  VSerafina Mitchell MD;  [facility];  Service: Cardiovascular;  Laterality: N/A;   ADENOIDECTOMY  age 89  BIOPSY  05/31/2018   Procedure: BIOPSY;  Surgeon: JMilus Banister MD;  [facility];  Service: Endoscopy;;   CATARACT EXTRACTION W/ INTRAOCULAR LENS  IMPLANT, BILATERAL  2009   COLONOSCOPY  2001, 05/02/2003, 01/28/11   2012: Right colon Crohn's, rectal polyp   COLONOSCOPY  03/31/2012   Procedure: COLONOSCOPY;  Surgeon: Jerene Bears, MD;  Location: [place];  Service: Gastroenterology;  Laterality: N/A;   COLONOSCOPY WITH PROPOFOL N/A 05/31/2018   Procedure: COLONOSCOPY WITH PROPOFOL;  Surgeon: Milus Banister, MD;  [facility];  Service: Endoscopy;  Laterality: N/A;   ESOPHAGOGASTRODUODENOSCOPY  01/28/11   Normal   FOOT SURGERY Right age 49   MOHS SURGERY Left 11/2013   left ankle parakerotosis    PERCUTANEOUS LIVER BIOPSY  2007 and 2008   PILONIDAL CYST EXCISION  age 32   [place] N/A 11/06/2015   Procedure: PLACEMENT OF SETON;  Surgeon: [name], MD;  [facility];  Service: General;  Laterality: N/A;   PLACEMENT OF SETON  07/2018   at [place] N/A 01/18/2019   Procedure: ANAL EXAM UNDER ANESTHESIA,  INCISION AND DRAINAGE, SETON PLACEMENT;  Surgeon: [name], MD;  Location: [place];  Service: General;  Laterality: N/A;   UPPER GASTROINTESTINAL ENDOSCOPY     Social History   Social History Narrative   Single, history of substance abuse in recovery   Previously occupied on medical disability   1 child  Daughter Theme park manager, lives and works in [place]   Elderly parents involved and he helps them   1 brother in [place]   family history includes Allergies in his father; Breast cancer in his mother; Clotting disorder in his father; Heart disease in his father; Stomach cancer in his mother; Thyroid cancer in his father.   Review of Systems See  HPI  Objective:   Physical Exam BP (!) 146/72    Pulse 67    Ht 6' 1"  (1.854 m)    Wt 182 lb (82.6 kg)    SpO2 99%    BMI 24.01 kg/m  Thin NAD Lungs cta Cor NL S1s2 no rmg Abd is soft and NT w/o HSM/mass, prominent abd wall vv Rectal shows it is open fistula tract on the right posterior perianal area with some feces in it.  No sign of abscess or infection. Ext no c/c/e Affect somewhat flat Alert and oriented x 3

## 2021-03-20 NOTE — Patient Instructions (Signed)
It has been recommended to you by your physician that you have a(n) colonoscopy completed. Per your request, we did not schedule the procedure(s) today. Please contact our office at [phone] should you decide to have the procedure completed. You will be scheduled for a pre-visit and procedure at that time.   I appreciate the opportunity to care for you. Silvano Rusk, MD, [facility]

## 2021-03-23 ENCOUNTER — Telehealth: Payer: Self-pay | Admitting: Internal Medicine

## 2021-03-23 ENCOUNTER — Encounter: Payer: Self-pay | Admitting: Internal Medicine

## 2021-03-23 DIAGNOSIS — K50813 Crohn's disease of both small and large intestine with fistula: Secondary | ICD-10-CM

## 2021-03-23 NOTE — Telephone Encounter (Signed)
I booked him for 05/14/2021 at 9:00am and will mail him instructions.

## 2021-03-23 NOTE — Telephone Encounter (Signed)
He is booked for 05/14/2021 at 9:00am. I will mail him instructions.

## 2021-03-23 NOTE — Telephone Encounter (Signed)
Patient called wanting to speak to you about a colonoscopy time. Stated that he had spoke to you about ceratin dates and times. Seeking advice, please advise.

## 2021-03-26 ENCOUNTER — Encounter: Payer: Self-pay | Admitting: Internal Medicine

## 2021-03-26 ENCOUNTER — Other Ambulatory Visit: Payer: Self-pay | Admitting: Internal Medicine

## 2021-03-26 MED ORDER — HYDROCODONE-ACETAMINOPHEN 5-325 MG PO TABS: 1.0000 | ORAL_TABLET | ORAL | 0 refills | Status: DC | PRN

## 2021-03-31 ENCOUNTER — Encounter: Payer: Self-pay | Admitting: Internal Medicine

## 2021-03-31 ENCOUNTER — Other Ambulatory Visit: Payer: Self-pay | Admitting: Internal Medicine

## 2021-03-31 DIAGNOSIS — Z13220 Encounter for screening for lipoid disorders: Secondary | ICD-10-CM | POA: Diagnosis not present

## 2021-03-31 DIAGNOSIS — Z125 Encounter for screening for malignant neoplasm of prostate: Secondary | ICD-10-CM | POA: Diagnosis not present

## 2021-03-31 DIAGNOSIS — E559 Vitamin D deficiency, unspecified: Secondary | ICD-10-CM | POA: Diagnosis not present

## 2021-03-31 DIAGNOSIS — I81 Portal vein thrombosis: Secondary | ICD-10-CM | POA: Diagnosis not present

## 2021-03-31 DIAGNOSIS — Z136 Encounter for screening for cardiovascular disorders: Secondary | ICD-10-CM | POA: Diagnosis not present

## 2021-03-31 DIAGNOSIS — M9903 Segmental and somatic dysfunction of lumbar region: Secondary | ICD-10-CM | POA: Diagnosis not present

## 2021-03-31 MED ORDER — HYDROCODONE-ACETAMINOPHEN 7.5-325 MG PO TABS: 1.0000 | ORAL_TABLET | Freq: Four times a day (QID) | ORAL | 0 refills | Status: DC | PRN

## 2021-04-01 DIAGNOSIS — F322 Major depressive disorder, single episode, severe without psychotic features: Secondary | ICD-10-CM | POA: Diagnosis not present

## 2021-04-01 DIAGNOSIS — E559 Vitamin D deficiency, unspecified: Secondary | ICD-10-CM | POA: Diagnosis not present

## 2021-04-01 DIAGNOSIS — E7800 Pure hypercholesterolemia, unspecified: Secondary | ICD-10-CM | POA: Diagnosis not present

## 2021-04-01 DIAGNOSIS — G4700 Insomnia, unspecified: Secondary | ICD-10-CM | POA: Diagnosis not present

## 2021-04-01 DIAGNOSIS — F411 Generalized anxiety disorder: Secondary | ICD-10-CM | POA: Diagnosis not present

## 2021-04-01 DIAGNOSIS — Z125 Encounter for screening for malignant neoplasm of prostate: Secondary | ICD-10-CM | POA: Diagnosis not present

## 2021-04-09 ENCOUNTER — Encounter: Payer: Self-pay | Admitting: Internal Medicine

## 2021-04-14 DIAGNOSIS — M9903 Segmental and somatic dysfunction of lumbar region: Secondary | ICD-10-CM | POA: Diagnosis not present

## 2021-04-16 DIAGNOSIS — I81 Portal vein thrombosis: Secondary | ICD-10-CM | POA: Diagnosis not present

## 2021-04-23 DIAGNOSIS — K5090 Crohn's disease, unspecified, without complications: Secondary | ICD-10-CM | POA: Diagnosis not present

## 2021-04-27 ENCOUNTER — Encounter: Payer: Self-pay | Admitting: Internal Medicine

## 2021-04-28 ENCOUNTER — Encounter: Payer: Self-pay | Admitting: Internal Medicine

## 2021-04-28 ENCOUNTER — Telehealth: Payer: Self-pay

## 2021-04-28 ENCOUNTER — Other Ambulatory Visit: Payer: Self-pay | Admitting: Internal Medicine

## 2021-04-28 DIAGNOSIS — M9903 Segmental and somatic dysfunction of lumbar region: Secondary | ICD-10-CM | POA: Diagnosis not present

## 2021-04-28 MED ORDER — HYDROCODONE-ACETAMINOPHEN 7.5-325 MG PO TABS: 1.0000 | ORAL_TABLET | Freq: Four times a day (QID) | ORAL | 0 refills | Status: DC | PRN

## 2021-04-28 MED ORDER — HYDROCODONE-ACETAMINOPHEN 5-325 MG PO TABS: 1.0000 | ORAL_TABLET | Freq: Four times a day (QID) | ORAL | 0 refills | Status: DC | PRN

## 2021-04-28 NOTE — Telephone Encounter (Signed)
I left [name] a message that we have cancelled the 31m and resent the 7.510m

## 2021-04-28 NOTE — Telephone Encounter (Signed)
Will use 7.5 again  Let [name] know and pharmacy to cancel 5 mg

## 2021-04-28 NOTE — Telephone Encounter (Signed)
Dr Carlean Purl the [place] just sent a fax that the hydrocodone-acetamin 5-329m is backordered/unavailable. Please advise Sir, thank you.

## 2021-04-29 ENCOUNTER — Other Ambulatory Visit: Payer: Self-pay | Admitting: Internal Medicine

## 2021-04-29 MED ORDER — HYDROCODONE-ACETAMINOPHEN 5-300 MG PO TABS: 1.0000 | ORAL_TABLET | Freq: Four times a day (QID) | ORAL | 0 refills | Status: DC | PRN

## 2021-04-29 NOTE — Progress Notes (Signed)
Change to hydrocodone 5-300 - patient request and shortage issues ?See My chart correspondence ?

## 2021-04-30 ENCOUNTER — Other Ambulatory Visit: Payer: Self-pay | Admitting: Internal Medicine

## 2021-04-30 ENCOUNTER — Telehealth: Payer: Self-pay

## 2021-04-30 ENCOUNTER — Encounter: Payer: Self-pay | Admitting: Internal Medicine

## 2021-04-30 MED ORDER — ACETAMINOPHEN-CODEINE #3 300-30 MG PO TABS: 1.0000 | ORAL_TABLET | Freq: Four times a day (QID) | ORAL | 0 refills | Status: DC | PRN

## 2021-04-30 NOTE — Progress Notes (Signed)
See My Chart messages for explanation ?

## 2021-04-30 NOTE — Telephone Encounter (Signed)
Pt brought 29 and 1/2 Norco tablets to give to RN to dispose of per [name]: ?Medication was counted with another Nurse: Ammie LPN: Count Verified 29 and 1/2 Tablets: ?Medication was taking to [facility] and disposed of there in Disposable box: Verified by two Nurses: Marquita Palms and Ammie LPN ?

## 2021-05-04 ENCOUNTER — Encounter: Payer: Self-pay | Admitting: Internal Medicine

## 2021-05-12 DIAGNOSIS — M9903 Segmental and somatic dysfunction of lumbar region: Secondary | ICD-10-CM | POA: Diagnosis not present

## 2021-05-14 ENCOUNTER — Other Ambulatory Visit: Payer: Self-pay

## 2021-05-14 ENCOUNTER — Ambulatory Visit (AMBULATORY_SURGERY_CENTER): Payer: Medicare Other | Admitting: Internal Medicine

## 2021-05-14 ENCOUNTER — Encounter: Payer: Self-pay | Admitting: Internal Medicine

## 2021-05-14 ENCOUNTER — Other Ambulatory Visit: Payer: Self-pay | Admitting: Internal Medicine

## 2021-05-14 VITALS — BP 139/80 | HR 50 | Temp 98.9°F | Resp 13 | Ht 73.0 in | Wt 182.0 lb

## 2021-05-14 DIAGNOSIS — K51418 Inflammatory polyps of colon with other complication: Secondary | ICD-10-CM | POA: Diagnosis not present

## 2021-05-14 DIAGNOSIS — K5730 Diverticulosis of large intestine without perforation or abscess without bleeding: Secondary | ICD-10-CM | POA: Diagnosis not present

## 2021-05-14 DIAGNOSIS — K648 Other hemorrhoids: Secondary | ICD-10-CM

## 2021-05-14 DIAGNOSIS — K5989 Other specified functional intestinal disorders: Secondary | ICD-10-CM | POA: Diagnosis not present

## 2021-05-14 DIAGNOSIS — K5140 Inflammatory polyps of colon without complications: Secondary | ICD-10-CM | POA: Diagnosis not present

## 2021-05-14 DIAGNOSIS — K6289 Other specified diseases of anus and rectum: Secondary | ICD-10-CM

## 2021-05-14 DIAGNOSIS — F419 Anxiety disorder, unspecified: Secondary | ICD-10-CM | POA: Diagnosis not present

## 2021-05-14 DIAGNOSIS — F329 Major depressive disorder, single episode, unspecified: Secondary | ICD-10-CM | POA: Diagnosis not present

## 2021-05-14 DIAGNOSIS — K50813 Crohn's disease of both small and large intestine with fistula: Secondary | ICD-10-CM

## 2021-05-14 DIAGNOSIS — D125 Benign neoplasm of sigmoid colon: Secondary | ICD-10-CM | POA: Diagnosis not present

## 2021-05-14 MED ORDER — SODIUM CHLORIDE 0.9 % IV SOLN: 500.0000 mL | Freq: Once | INTRAVENOUS | Status: DC

## 2021-05-14 NOTE — Progress Notes (Signed)
PT taken to PACU. Monitors in place. VSS. Report given to RN. 

## 2021-05-14 NOTE — Progress Notes (Signed)
[facility] History and Physical ? ? ?Primary Care Physician:  [name], MD ? ? ?Reason for Procedure:   Crohn's disease ? ?Plan:    colonoscopy ? ? ? ? ?HPI: [name] is a 56 y.o. male here for reassessment of Crohn's of small and large intestine. ? ? ?Past Medical History:  ?Diagnosis Date  ? Allergy   ? Anal fistula   ? Anxiety   ? Arthritis   ? ? of migratory arthritis  ? Autoimmune hepatitis ([place]) 01/19/2013  ? liver function checked every 2 or 3 months sees dr Carlean Purl  ? Avoidant-restrictive food intake disorder (ARFID) ? 06/15/2018  ? Cancer of skin of neck   ? Cataract   ? Chronic mesenteric ischemia ([place])   ? Chronic pain syndrome 07/09/2016  ? Crohn's disease of small and large intestines ([place])   ? followed by dr Glendell Docker Korena Nass  ? Dairy product intolerance   ? Diarrhea, functional   ? Family history of adverse reaction to anesthesia   ? Grover's disease   ? transient acantholytic dermatosis  ? History of alcohol abuse   ? History of basal cell carcinoma excision   ? 2013 left leg  ? History of Clostridium difficile   ? 10/ 2014  ? History of multiple concussions   ? x6   last one Jan 2017 per pt--  no residual  ? History of substance abuse ([place])   ? quit 1997 per pt  ? History of suicide attempt   ? 05-18-2012  overdose /  failure to thrive  ? Iron deficiency anemia due to chronic blood loss   ? Major depression, recurrent, chronic ([place])   ? Osteopenia   ? Personal history of adenomatous colonic polyps 12/2010, 03/2012  ? 12/2010 - 8 mm serrated adenoma of rectum  ? Portal vein thrombosis 03/21/2015  ? right  ? Primary sclerosing cholangitis   ? ? hepatitis overlap - liver bx x 2 and MRCP  ? Seasonal allergies   ? Substance abuse ([place]) 1997  ? Alcohol  ? Vitamin A deficiency 05/23/2018  ? Vitamin B6 deficiency 07/27/2018  ? Vitamin C deficiency 05/23/2018  ? ? ?Past Surgical History:  ?Procedure Laterality Date  ? ABDOMINAL AORTAGRAM N/A 03/29/2012  ? Procedure: ABDOMINAL  AORTAGRAM;  Surgeon: Serafina Mitchell, MD;  Location: [facility] CATH LAB;  Service: Cardiovascular;  Laterality: N/A;  ? ADENOIDECTOMY  age 23  ? BIOPSY  05/31/2018  ? Procedure: BIOPSY;  Surgeon: Milus Banister, MD;  Location: [facility];  Service: Endoscopy;;  ? CATARACT EXTRACTION W/ INTRAOCULAR LENS  IMPLANT, BILATERAL  2009  ? COLONOSCOPY  2001, 05/02/2003, 01/28/11  ? 2012: Right colon Crohn's, rectal polyp  ? COLONOSCOPY  03/31/2012  ? Procedure: COLONOSCOPY;  Surgeon: Jerene Bears, MD;  Location: [place];  Service: Gastroenterology;  Laterality: N/A;  ? COLONOSCOPY WITH PROPOFOL N/A 05/31/2018  ? Procedure: COLONOSCOPY WITH PROPOFOL;  Surgeon: Milus Banister, MD;  Location: [facility] ENDOSCOPY;  Service: Endoscopy;  Laterality: N/A;  ? ESOPHAGOGASTRODUODENOSCOPY  01/28/11  ? Normal  ? FOOT SURGERY Right age 77  ? MOHS SURGERY Left 11/2013  ? left ankle parakerotosis   ? PERCUTANEOUS LIVER BIOPSY  2007 and 2008  ? PILONIDAL CYST EXCISION  age 35  ? PLACEMENT OF SETON N/A 11/06/2015  ? Procedure: PLACEMENT OF SETON;  Surgeon: [name], MD;  Location: [facility];  Service: General;  Laterality: N/A;  ? [place]  07/2018  ? at [facility]  ? RECTAL EXAM UNDER ANESTHESIA N/A 01/18/2019  ? Procedure: ANAL EXAM UNDER ANESTHESIA,  INCISION AND DRAINAGE, SETON PLACEMENT;  Surgeon: [name], MD;  Location: [place];  Service: General;  Laterality: N/A;  ? UPPER GASTROINTESTINAL ENDOSCOPY    ? ? ?Prior to Admission medications   ?Medication Sig Start Date End Date Taking? Authorizing Provider  ?acetaminophen-codeine (TYLENOL #3) 300-30 MG tablet Take 1 tablet by mouth every 6 (six) hours as needed for moderate pain. 04/30/21  Yes [name], MD  ?Ascorbic Acid (VITAMIN C) 1000 MG tablet Take 1,000 mg by mouth daily.   Yes [provider]  ?Cholecalciferol (VITAMIN D) 125 MCG (5000 UT) CAPS Take 5,000 Units by mouth daily.   Yes [provider]  ?clonazePAM  (KLONOPIN) 1 MG tablet Take 1 mg by mouth 2 (two) times daily. Takes 1/2 tab (0.38m)   Yes [provider]  ?diltiazem 2 % GEL Apply a pea size amount into rectum 2 times a day ?Patient taking differently: Apply a pea size amount into rectum prn 09/24/20  Yes GGatha Mayer MD  ?gabapentin (NEURONTIN) 600 MG tablet TAKE 1 TABLET BY MOUTH EVERYDAY AT BEDTIME 02/25/21  Yes GGatha Mayer MD  ?Melatonin 3 MG CAPS Take 3 mg by mouth at bedtime as needed.   Yes [provider]  ?predniSONE (DELTASONE) 20 MG tablet TAKE 1 AND 1/2 TABLETS (30 MG TOTAL) BY MOUTH DAILY WITH BREAKFAST. 12/29/20  Yes GGatha Mayer MD  ?traZODone (DESYREL) 50 MG tablet Take 1 tablet (50 mg total) by mouth at bedtime. 06/06/20 05/14/21 Yes PEulis CannerE, NP  ?ferric carboxymaltose (Christeen Douglas 750 MG/15ML SOLN injection Infuse Day 1 and Day 7 02/16/21   GGatha Mayer MD  ?OVER THE COUNTER MEDICATION Vitamin K- [id number]    [provider]  ?vedolizumab (ENTYVIO) 300 MG injection Inject 300 mg into the vein every 6 (six) weeks. 08/25/20   GeGatha MayerMD  ? ? ?Current Outpatient Medications  ?Medication Sig Dispense Refill  ? acetaminophen-codeine (TYLENOL #3) 300-30 MG tablet Take 1 tablet by mouth every 6 (six) hours as needed for moderate pain. 30 tablet 0  ? Ascorbic Acid (VITAMIN C) 1000 MG tablet Take 1,000 mg by mouth daily.    ? Cholecalciferol (VITAMIN D) 125 MCG (5000 UT) CAPS Take 5,000 Units by mouth daily.    ? clonazePAM (KLONOPIN) 1 MG tablet Take 1 mg by mouth 2 (two) times daily. Takes 1/2 tab (0.22m28m   ? diltiazem 2 % GEL Apply a pea size amount into rectum 2 times a day (Patient taking differently: Apply a pea size amount into rectum prn) 30 g 0  ? gabapentin (NEURONTIN) 600 MG tablet TAKE 1 TABLET BY MOUTH EVERYDAY AT BEDTIME 90 tablet 0  ? Melatonin 3 MG CAPS Take 3 mg by mouth at bedtime as needed.    ? predniSONE (DELTASONE) 20 MG tablet TAKE 1 AND 1/2 TABLETS (30 MG TOTAL) BY  MOUTH DAILY WITH BREAKFAST. 135 tablet 1  ? traZODone (DESYREL) 50 MG tablet Take 1 tablet (50 mg total) by mouth at bedtime. 30 tablet 2  ? ferric carboxymaltose (INJECTAFER) 750 MG/15ML SOLN injection Infuse Day 1 and Day 7 15 mL 1  ? OVER THE COUNTER MEDICATION Vitamin K- [id number]    ? vedolizumab (ENTYVIO) 300 MG injection Inject 300 mg into the vein every 6 (six) weeks. 1 each 9  ? ?  Current Facility-Administered Medications  ?Medication Dose Route Frequency Provider Last Rate Last Admin  ? 0.9 %  sodium chloride infusion  500 mL Intravenous Once [name], MD      ? ? ?Allergies as of 05/14/2021 - Review Complete 05/14/2021  ?Allergen Reaction Noted  ? Asacol [mesalamine] Diarrhea 03/09/2011  ? Flagyl [metronidazole] Diarrhea 02/05/2016  ? Amoxicillin Other (See Comments) 08/22/2018  ? Azathioprine Diarrhea 08/07/2010  ? Gluten meal Diarrhea 03/21/2015  ? Metoprolol tartrate  01/17/2019  ? Mycophenolate mofetil Diarrhea 08/07/2010  ? Other  01/18/2012  ? Tylenol [acetaminophen] Other (See Comments) 01/18/2019  ? Wheat bran Diarrhea 01/18/2012  ? Tape Itching and Rash 03/21/2015  ? ? ?Family History  ?Problem Relation Age of Onset  ? Heart disease Father   ? Thyroid cancer Father   ? Allergies Father   ? Clotting disorder Father   ? Breast cancer Mother   ? Stomach cancer Mother   ? Colon cancer Neg Hx   ? Esophageal cancer Neg Hx   ? Rectal cancer Neg Hx   ? ? ?Social History  ? ?Socioeconomic History  ? Marital status: Single  ?  Spouse name: Not on file  ? Number of children: 1  ? Years of education: Not on file  ? Highest education level: Not on file  ?Occupational History  ? Occupation: Disability   ?Tobacco Use  ? Smoking status: Former  ?  Packs/day: 1.00  ?  Years: 15.00  ?  Pack years: 15.00  ?  Types: Cigarettes  ?  Quit date: 03/28/1997  ?  Years since quitting: 24.1  ? Smokeless tobacco: Never  ?Vaping Use  ? Vaping Use: Never used  ?Substance and Sexual Activity  ? Alcohol use: No  ?   Alcohol/week: 0.0 standard drinks  ? Drug use: Not Currently  ?  Types: Cocaine, Marijuana  ?  Comment: QUIT USING DRUGS IN 1997  ? Sexual activity: Not Currently  ?Other Topics Concern  ? Not on file  ?Social History N

## 2021-05-14 NOTE — Progress Notes (Signed)
Called to room to assist during endoscopic procedure.  Patient ID and intended procedure confirmed with present staff. Received instructions for my participation in the procedure from the performing physician.  

## 2021-05-14 NOTE — Patient Instructions (Addendum)
The lining of large and small intestine look good! ?There were numerous polyps that look inflammatory. 2 were removed and multiple others biopsied. These are essentially scar tissue and not typically thought of as active disease. ?The anus was somewhat scarred and narrowed and received some stretching. I did not see any activity from fistula. ? ?I will let you know pathology results and when to have another routine colonoscopy by mail and/or My Chart. ? ?I appreciate the opportunity to care for you. ?[name], MD, [facility] ? ? ? ?YOU HAD AN ENDOSCOPIC PROCEDURE TODAY AT [place] [facility]:   Refer to the procedure report that was given to you for any specific questions about what was found during the examination.  If the procedure report does not answer your questions, please call your gastroenterologist to clarify.  If you requested that your care partner not be given the details of your procedure findings, then the procedure report has been included in a sealed envelope for you to review at your convenience later. ? ?**Handouts given on polyps, hemorrhoids and diverticulosis** ? ?YOU SHOULD EXPECT: Some feelings of bloating in the abdomen. Passage of more gas than usual.  Walking can help get rid of the air that was put into your GI tract during the procedure and reduce the bloating. If you had a lower endoscopy (such as a colonoscopy or flexible sigmoidoscopy) you may notice spotting of blood in your stool or on the toilet paper. If you underwent a bowel prep for your procedure, you may not have a normal bowel movement for a few days. ? ?Please Note:  You might notice some irritation and congestion in your nose or some drainage.  This is from the oxygen used during your procedure.  There is no need for concern and it should clear up in a day or so. ? ?SYMPTOMS TO REPORT IMMEDIATELY: ? ?Following lower endoscopy (colonoscopy or flexible sigmoidoscopy): ? Excessive amounts of blood in the  stool ? Significant tenderness or worsening of abdominal pains ? Swelling of the abdomen that is new, acute ? Fever of 100?F or higher ? ?For urgent or emergent issues, a gastroenterologist can be reached at any hour by calling [phone]. ?Do not use MyChart messaging for urgent concerns.  ? ? ?DIET:  We do recommend a small meal at first, but then you may proceed to your regular diet.  Drink plenty of fluids but you should avoid alcoholic beverages for 24 hours. ? ?ACTIVITY:  You should plan to take it easy for the rest of today and you should NOT DRIVE or use heavy machinery until tomorrow (because of the sedation medicines used during the test).   ? ?FOLLOW UP: ?Our staff will call the number listed on your records 48-72 hours following your procedure to check on you and address any questions or concerns that you may have regarding the information given to you following your procedure. If we do not reach you, we will leave a message.  We will attempt to reach you two times.  During this call, we will ask if you have developed any symptoms of COVID 19. If you develop any symptoms (ie: fever, flu-like symptoms, shortness of breath, cough etc.) before then, please call [phone].  If you test positive for Covid 19 in the 2 weeks post procedure, please call and report this information to [place].   ? ?If any biopsies were taken you will be contacted by phone or by letter within the next  1-3 weeks.  Please call us at [phone] if you have not heard about the biopsies in 3 weeks.  ? ? ?SIGNATURES/CONFIDENTIALITY: ?You and/or your care partner have signed paperwork which will be entered into your electronic medical record.  These signatures attest to the fact that that the information above on your After Visit Summary has been reviewed and is understood.  Full responsibility of the confidentiality of this discharge information lies with you and/or your care-partner.  ?

## 2021-05-14 NOTE — Progress Notes (Signed)
VS completed by CW. ? ? ?Medical history reviewed and updated. ? ?Paper tape! ?

## 2021-05-14 NOTE — Op Note (Signed)
[place] ?Patient Name: [name] ?Procedure Date: 05/14/2021 9:06 AM ?MRN: [id number] ?Endoscopist: [name] , MD ?Age: 56 ?Referring MD:  ?Date of Birth: 06/05/1965 ?Gender: Male ?Account #: [id number] ?Procedure:                Colonoscopy ?Indications:              Crohn's disease of the small bowel and colon,  ?                          Follow-up of Crohn's disease of the small bowel and  ?                          colon, Disease activity assessment of Crohn's  ?                          disease of the small bowel and colon, Assess  ?                          therapeutic response to therapy of Crohn's disease  ?                          of the small bowel and colon ?Medicines:                Monitored Anesthesia Care ?Procedure:                Pre-Anesthesia Assessment: ?                          - Prior to the procedure, a History and Physical  ?                          was performed, and patient medications and  ?                          allergies were reviewed. The patient's tolerance of  ?                          previous anesthesia was also reviewed. The risks  ?                          and benefits of the procedure and the sedation  ?                          options and risks were discussed with the patient.  ?                          All questions were answered, and informed consent  ?                          was obtained. Prior Anticoagulants: The patient has  ?                          taken no previous anticoagulant or antiplatelet  ?  agents. ASA Grade Assessment: II - A patient with  ?                          mild systemic disease. After reviewing the risks  ?                          and benefits, the patient was deemed in  ?                          satisfactory condition to undergo the procedure. ?                          After obtaining informed consent, the colonoscope  ?                          was passed under direct vision.  Throughout the  ?                          procedure, the patient's blood pressure, pulse, and  ?                          oxygen saturations were monitored continuously. The  ?                          Olympus CF-[id number] (#[id number]) Colonoscope was  ?                          introduced through the anus and advanced to the the  ?                          terminal ileum, with identification of the  ?                          appendiceal orifice and IC valve. The colonoscopy  ?                          was performed without difficulty. The patient  ?                          tolerated the procedure well. The quality of the  ?                          bowel preparation was good. The terminal ileum,  ?                          ileocecal valve, appendiceal orifice, and rectum  ?                          were photographed. The bowel preparation used was  ?                          Miralax via split dose instruction. ?Scope In: 9:16:43 AM ?Scope Out: 9:39:52 AM ?Scope Withdrawal Time: 0 hours 17 minutes 23 seconds  ?Total Procedure  Duration: 0 hours 23 minutes 9 seconds  ?Findings:                 The perianal exam findings include scars from  ?                          fistuae and surgery. ?                          The digital rectal exam findings include anal  ?                          stricture. ?                          The terminal ileum appeared normal. Biopsies were  ?                          taken with a cold forceps for histology.  ?                          Verification of patient identification for the  ?                          specimen was done. Estimated blood loss was minimal. ?                          Diffuse pseudopolyps were found in the entire  ?                          colon. This was biopsied with a cold forceps for  ?                          histology. Verification of patient identification  ?                          for the specimen was done. Estimated blood loss was  ?                           minimal. ?                          A 2 mm polyp was found in the proximal sigmoid  ?                          colon. The polyp was sessile. The polyp was removed  ?                          with a cold biopsy forceps. Resection and retrieval  ?                          were complete. Verification of patient  ?                          identification for the specimen was done. Estimated  ?  blood loss was minimal. ?                          A 6 mm polyp was found in the mid sigmoid colon.  ?                          The polyp was sessile. The polyp was removed with a  ?                          cold snare. Resection and retrieval were complete.  ?                          Verification of patient identification for the  ?                          specimen was done. Estimated blood loss was minimal. ?                          Multiple diverticula were found in the sigmoid  ?                          colon. ?                          Internal hemorrhoids were found. ?                          Biopsies for histology were taken with a cold  ?                          forceps from the right colon and left colon for  ?                          evaluation of microscopic colitis. Estimated blood  ?                          loss was minimal. ?                          A tattoo was seen in the transverse colon. ?Complications:            No immediate complications. ?Estimated Blood Loss:     Estimated blood loss was minimal. ?Impression:               - Scars from fistuae and surgery found on perianal  ?                          exam. NO DRAINAGE ?                          - Anal stricture found on digital rectal exam.  ?                          DILATED WITH INDEX FINGER ?                          -  The examined portion of the ileum was normal.  ?                          Biopsied. ?                          - Pseudopolyps in the entire examined colon.  ?                          Biopsied. ?                           - One 2 mm polyp in the proximal sigmoid colon,  ?                          removed with a cold biopsy forceps. Resected and  ?                          retrieved. ?                          - One 6 mm polyp in the mid sigmoid colon, removed  ?                          with a cold snare. Resected and retrieved. ?                          - Diverticulosis in the sigmoid colon. ?                          - Internal hemorrhoids. ?                          - A tattoo was seen in the transverse colon. ?                          - Biopsies were taken with a cold forceps from the  ?                          right colon and left colon for evaluation of  ?                          microscopic colitis. ?                          - OVERALL MUCOSA APPEARS HEALED ON ENTYVIO AND  ?                          PREDNISONE. I REMOVED 2 POLYPS THAT I THOUGHT MIGHT  ?                          BE NEOPLASIA. NUMEROUS OTHER POLYOPS ALL LOOKED  ?                          LIKE PSEUDOPOLYPS. ?Recommendation:           -  Patient has a contact number available for  ?                          emergencies. The signs and symptoms of potential  ?                          delayed complications were discussed with the  ?                          patient. Return to normal activities tomorrow.  ?                          Written discharge instructions were provided to the  ?                          patient. ?                          - Resume previous diet. ?                          - Continue present medications. ?                          - Await pathology results. ?                          - Repeat colonoscopy is recommended for  ?                          surveillance. The colonoscopy date will be  ?                          determined after pathology results from today's  ?                          exam become available for review. ?[name], MD ?05/14/2021 9:54:18 AM ?This report has been signed electronically. ?

## 2021-05-15 DIAGNOSIS — I81 Portal vein thrombosis: Secondary | ICD-10-CM | POA: Diagnosis not present

## 2021-05-17 ENCOUNTER — Other Ambulatory Visit: Payer: Self-pay | Admitting: Internal Medicine

## 2021-05-18 ENCOUNTER — Telehealth: Payer: Self-pay | Admitting: *Deleted

## 2021-05-18 ENCOUNTER — Telehealth: Payer: Self-pay

## 2021-05-18 ENCOUNTER — Other Ambulatory Visit: Payer: Self-pay | Admitting: Internal Medicine

## 2021-05-18 NOTE — Telephone Encounter (Signed)
?  Follow up Call- ? ?Call back number 05/14/2021  ?Post procedure Call Back phone  # [phone]  ?Permission to leave phone message Yes  ?Some recent data might be hidden  ?  ? ?Patient questions: ? ?Do you have a fever, pain , or abdominal swelling? No. ?Pain Score  0 * ? ?Have you tolerated food without any problems? Yes.   ? ?Have you been able to return to your normal activities? Yes.   ? ?Do you have any questions about your discharge instructions: ?Diet   Yes.   ?Medications  No. ?Follow up visit  No. ? ?Do you have questions or concerns about your Care? No. ? ?Actions: ?* If pain score is 4 or above: ?No action needed, pain <4. ? ?Have you developed a fever since your procedure? no ? ?2.   Have you had an respiratory symptoms (SOB or cough) since your procedure? no ? ?3.   Have you tested positive for COVID 19 since your procedure no ? ?4.   Have you had any family members/close contacts diagnosed with the COVID 19 since your procedure?  no ? ? ?If yes to any of these questions please route to Joylene John, RN and Joella Prince, RN  ? ? ?

## 2021-05-18 NOTE — Telephone Encounter (Signed)
?  Follow up Call- ? ?Call back number 05/14/2021  ?Post procedure Call Back phone  # [phone]  ?Permission to leave phone message Yes  ?Some recent data might be hidden  ?  ?First follow up call, LVM ? ? ?

## 2021-05-21 ENCOUNTER — Encounter: Payer: Self-pay | Admitting: Internal Medicine

## 2021-05-26 ENCOUNTER — Encounter: Payer: Self-pay | Admitting: Internal Medicine

## 2021-05-26 DIAGNOSIS — M9903 Segmental and somatic dysfunction of lumbar region: Secondary | ICD-10-CM | POA: Diagnosis not present

## 2021-05-27 ENCOUNTER — Telehealth: Payer: Self-pay | Admitting: Internal Medicine

## 2021-05-27 ENCOUNTER — Other Ambulatory Visit: Payer: Self-pay | Admitting: Internal Medicine

## 2021-05-27 MED ORDER — HYDROCODONE-ACETAMINOPHEN 5-325 MG PO TABS: 1.0000 | ORAL_TABLET | Freq: Four times a day (QID) | ORAL | 0 refills | Status: DC | PRN

## 2021-05-27 MED ORDER — ACETAMINOPHEN-CODEINE #3 300-30 MG PO TABS: 1.0000 | ORAL_TABLET | Freq: Four times a day (QID) | ORAL | 0 refills | Status: DC | PRN

## 2021-05-27 NOTE — Telephone Encounter (Signed)
I had sent a prescription for hydrocodone 5 mg acetaminophen 325 mg in for [name].  That is now on backorder again.  The substitution would be 5-300 mg but something about that does not agree with him so he is requested we use Tylenol 3 again which we will do. ? ?

## 2021-05-28 ENCOUNTER — Encounter: Payer: Self-pay | Admitting: Internal Medicine

## 2021-06-04 DIAGNOSIS — K5090 Crohn's disease, unspecified, without complications: Secondary | ICD-10-CM | POA: Diagnosis not present

## 2021-06-16 DIAGNOSIS — M9903 Segmental and somatic dysfunction of lumbar region: Secondary | ICD-10-CM | POA: Diagnosis not present

## 2021-06-16 DIAGNOSIS — I81 Portal vein thrombosis: Secondary | ICD-10-CM | POA: Diagnosis not present

## 2021-06-23 ENCOUNTER — Encounter: Payer: Self-pay | Admitting: Internal Medicine

## 2021-06-26 ENCOUNTER — Other Ambulatory Visit: Payer: Self-pay | Admitting: Internal Medicine

## 2021-06-26 ENCOUNTER — Encounter: Payer: Self-pay | Admitting: Internal Medicine

## 2021-06-26 MED ORDER — HYDROCODONE-ACETAMINOPHEN 5-325 MG PO TABS: 1.0000 | ORAL_TABLET | Freq: Four times a day (QID) | ORAL | 0 refills | Status: DC | PRN

## 2021-06-30 DIAGNOSIS — M9903 Segmental and somatic dysfunction of lumbar region: Secondary | ICD-10-CM | POA: Diagnosis not present

## 2021-07-14 DIAGNOSIS — M9903 Segmental and somatic dysfunction of lumbar region: Secondary | ICD-10-CM | POA: Diagnosis not present

## 2021-07-15 ENCOUNTER — Encounter: Payer: Self-pay | Admitting: Internal Medicine

## 2021-07-15 ENCOUNTER — Ambulatory Visit (INDEPENDENT_AMBULATORY_CARE_PROVIDER_SITE_OTHER): Payer: Medicare Other | Admitting: Internal Medicine

## 2021-07-15 ENCOUNTER — Other Ambulatory Visit (INDEPENDENT_AMBULATORY_CARE_PROVIDER_SITE_OTHER): Payer: Medicare Other

## 2021-07-15 VITALS — BP 124/74 | HR 64 | Ht 73.0 in | Wt 183.4 lb

## 2021-07-15 DIAGNOSIS — K50813 Crohn's disease of both small and large intestine with fistula: Secondary | ICD-10-CM | POA: Diagnosis not present

## 2021-07-15 DIAGNOSIS — K754 Autoimmune hepatitis: Secondary | ICD-10-CM

## 2021-07-15 DIAGNOSIS — K7469 Other cirrhosis of liver: Secondary | ICD-10-CM | POA: Diagnosis not present

## 2021-07-15 DIAGNOSIS — D500 Iron deficiency anemia secondary to blood loss (chronic): Secondary | ICD-10-CM

## 2021-07-15 DIAGNOSIS — F1190 Opioid use, unspecified, uncomplicated: Secondary | ICD-10-CM

## 2021-07-15 DIAGNOSIS — Z7960 Long term (current) use of unspecified immunomodulators and immunosuppressants: Secondary | ICD-10-CM

## 2021-07-15 DIAGNOSIS — Z7952 Long term (current) use of systemic steroids: Secondary | ICD-10-CM

## 2021-07-15 LAB — COMPREHENSIVE METABOLIC PANEL
ALT: 159 U/L — ABNORMAL HIGH (ref 0–53)
AST: 63 U/L — ABNORMAL HIGH (ref 0–37)
Albumin: 3.6 g/dL (ref 3.5–5.2)
Alkaline Phosphatase: 190 U/L — ABNORMAL HIGH (ref 39–117)
BUN: 26 mg/dL — ABNORMAL HIGH (ref 6–23)
CO2: 26 mEq/L (ref 19–32)
Calcium: 8.6 mg/dL (ref 8.4–10.5)
Chloride: 105 mEq/L (ref 96–112)
Creatinine, Ser: 1.15 mg/dL (ref 0.40–1.50)
GFR: 70.31 mL/min (ref >60.00)
Glucose, Bld: 113 mg/dL — ABNORMAL HIGH (ref 70–99)
Potassium: 4 mEq/L (ref 3.5–5.1)
Sodium: 140 mEq/L (ref 135–145)
Total Bilirubin: 0.5 mg/dL (ref 0.2–1.2)
Total Protein: 6 g/dL (ref 6.0–8.3)

## 2021-07-15 LAB — CBC WITH DIFFERENTIAL/PLATELET
Basophils Absolute: 0 10*3/uL (ref 0.0–0.1)
Basophils Relative: 0.2 % (ref 0.0–3.0)
Eosinophils Absolute: 0 10*3/uL (ref 0.0–0.7)
Eosinophils Relative: 0.1 % (ref 0.0–5.0)
HCT: 47.7 % (ref 39.0–52.0)
Hemoglobin: 15.6 g/dL (ref 13.0–17.0)
Lymphocytes Relative: 4.3 % — ABNORMAL LOW (ref 12.0–46.0)
Lymphs Abs: 0.6 10*3/uL — ABNORMAL LOW (ref 0.7–4.0)
MCHC: 32.7 g/dL (ref 30.0–36.0)
MCV: 93.3 fl (ref 78.0–100.0)
Monocytes Absolute: 0.6 10*3/uL (ref 0.1–1.0)
Monocytes Relative: 4.4 % (ref 3.0–12.0)
Neutro Abs: 12 10*3/uL — ABNORMAL HIGH (ref 1.4–7.7)
Neutrophils Relative %: 91 % — ABNORMAL HIGH (ref 43.0–77.0)
Platelets: 142 10*3/uL — ABNORMAL LOW (ref 150.0–400.0)
RBC: 5.12 Mil/uL (ref 4.22–5.81)
RDW: 15.2 % (ref 11.5–15.5)
WBC: 13.2 10*3/uL — ABNORMAL HIGH (ref 4.0–10.5)

## 2021-07-15 LAB — FERRITIN: Ferritin: 79.3 ng/mL (ref 22.0–322.0)

## 2021-07-15 NOTE — Patient Instructions (Signed)
Your provider has requested that you go to the basement level for lab work before leaving today. Press "B" on the elevator. The lab is located at the first door on the left as you exit the elevator. ? ?Due to recent changes in healthcare laws, you may see the results of your imaging and laboratory studies on MyChart before your provider has had a chance to review them.  We understand that in some cases there may be results that are confusing or concerning to you. Not all laboratory results come back in the same time frame and the provider may be waiting for multiple results in order to interpret others.  Please give [place] 48 hours in order for your provider to thoroughly review all the results before contacting the office for clarification of your results.  ? ? ?I appreciate the opportunity to care for you. ?Silvano Rusk, MD, [facility] ?

## 2021-07-15 NOTE — Progress Notes (Signed)
? ?NOLEN LINDAMOOD 56 y.o. Oct 27, 1965 [id number] ? ?Assessment & Plan:  ? ?Encounter Diagnoses  ?Name Primary?  ? Crohn's disease of both small and large intestine with fistula ([place]) Yes  ? Autoimmune hepatitis-PSC overlap   ? Other cirrhosis of liver ([place])   ? Long-term use of immunosuppressant medication - Entyvio   ? Iron deficiency anemia due to chronic blood loss   ? Chronic, continuous use of opioids   ? Long term current use of systemic steroids   ? ? ?Overall he is stable.  Since he does have a cirrhotic liver he should have periodic screening for hepatocellular carcinoma.  In his case I favor MRI since he has a PSC.  This was last done in 2021.  He is not inclined to do that right now he understands the risks of not doing so.  He is also well aware and accepts the risks of long-term steroid use. ? ?Lab work-up today as follows: ? ?Orders Placed This Encounter  ?Procedures  ? CBC with Differential/Platelet  ? Comprehensive metabolic panel  ? Ferritin  ? AFP tumor marker  ? QuantiFERON-TB Gold Plus  ? Drug Screen 8 w/Conf, Ur  ? ? ? ? ?Continue current medications ? ?Other follow-up to be determined after lab review ? ?CC: [name], MD ?Dr. [name] ? ? ?Subjective:  ? ?Chief Complaint: Follow-up of Crohn's disease and multiple other GI conditions ? ?HPI ?Patient is a 56 year old white man with a history of Crohn's of the large and small intestine with fistulizing disease, related iron deficiency anemia, autoimmune hepatitis/PSC overlap syndrome with cirrhosis and depression who is here for follow-up.  He has some chronic pain issues as well and he gets 30 hydrocodone 5 mg/acetaminophen 325 mg a month.  He has been compliant with testing etc. ? ?He reports that overall he is feeling well.  His ferritin was low in the spring and he had parenteral iron infusion in [facility].  He continues on [place] every 6 weeks.  He had a colonoscopy in March of this year with a lot of  inflammatory/pseudopolyps but the mucosa was without active colitis.  Ileum normal as well.  No diarrhea or bleeding right now.  Seasonal allergy issues particularly when doing yard work.  He has a persistent fistula he leaves some tissue in there, this is where he had a seton before and it drains a little stool but no signs of infection abscess etc.  Continues on 30 mg of prednisone in addition to the Entyvio. ?Allergies  ?Allergen Reactions  ? Asacol [Mesalamine] Diarrhea  ?  abd pain  ? Flagyl [Metronidazole] Diarrhea  ?  abd pain  ? Amoxicillin Other (See Comments)  ?  Lots of gas  ? Azathioprine Diarrhea  ?  abd pain  ? Gluten Meal Diarrhea  ? Metoprolol Tartrate   ?  Stomach upset  ? Mycophenolate Mofetil Diarrhea  ?  abd pain  ? Other   ?  NUTS; constipation  ? Tylenol [Acetaminophen] Other (See Comments)  ? Wheat Bran Diarrhea  ?  Constipation; flatulence; abd pain  ? Tape Itching and Rash  ?  Please use "paper" tape  ? ?Current Meds  ?Medication Sig  ? Ascorbic Acid (VITAMIN C) 1000 MG tablet Take 1,000 mg by mouth daily.  ? Cholecalciferol (VITAMIN D) 125 MCG (5000 UT) CAPS Take 5,000 Units by mouth daily.  ? clonazePAM (KLONOPIN) 1 MG tablet Take 1 mg by mouth 2 (two) times daily.  Takes 1/2 tab (0.82m)  ? diltiazem 2 % GEL Apply a pea size amount into rectum 2 times a day (Patient taking differently: Apply a pea size amount into rectum prn)  ? ferric carboxymaltose (INJECTAFER) 750 MG/15ML SOLN injection Infuse Day 1 and Day 7  ? gabapentin (NEURONTIN) 600 MG tablet TAKE 1 TABLET BY MOUTH EVERYDAY AT BEDTIME  ? HYDROcodone-acetaminophen (NORCO/VICODIN) 5-325 MG tablet Take 1 tablet by mouth every 6 (six) hours as needed for moderate pain.  ? Melatonin 3 MG CAPS Take 3 mg by mouth at bedtime as needed.  ? OVER THE COUNTER MEDICATION Vitamin K- 449mdaily  ? predniSONE (DELTASONE) 20 MG tablet TAKE 1 AND 1/2 TABLETS (30 MG TOTAL) BY MOUTH DAILY WITH BREAKFAST.  ? vedolizumab (ENTYVIO) 300 MG injection  Inject 300 mg into the vein every 6 (six) weeks.  ? ?Past Medical History:  ?Diagnosis Date  ? Allergy   ? Anal fistula   ? Anxiety   ? Arthritis   ? ? of migratory arthritis  ? Autoimmune hepatitis (HCM[place]11/21/2014  ? liver function checked every 2 or 3 months sees dr geCarlean Purl? Avoidant-restrictive food intake disorder (ARFID) ? 06/15/2018  ? Cancer of skin of neck   ? Cataract   ? Chronic mesenteric ischemia ([place]  ? Chronic pain syndrome 07/09/2016  ? Crohn's disease of small and large intestines (HCP[place]  ? followed by dr caGlendell Dockeressner  ? Dairy product intolerance   ? Diarrhea, functional   ? Family history of adverse reaction to anesthesia   ? Grover's disease   ? transient acantholytic dermatosis  ? History of alcohol abuse   ? History of basal cell carcinoma excision   ? 2013 left leg  ? History of Clostridium difficile   ? 10/ 2014  ? History of multiple concussions   ? x6   last one Jan 2017 per pt--  no residual  ? History of substance abuse ([place]CR[place]  ? quit 1997 per pt  ? History of suicide attempt   ? 05-18-2012  overdose /  failure to thrive  ? Iron deficiency anemia due to chronic blood loss   ? Major depression, recurrent, chronic ([place]CA[place]  ? Osteopenia   ? Personal history of adenomatous colonic polyps 12/2010, 03/2012  ? 12/2010 - 8 mm serrated adenoma of rectum  ? Portal vein thrombosis 03/21/2015  ? right  ? Primary sclerosing cholangitis   ? ? hepatitis overlap - liver bx x 2 and MRCP  ? Seasonal allergies   ? Substance abuse ([place]CD[place]  ? Alcohol  ? Vitamin A deficiency 05/23/2018  ? Vitamin B6 deficiency 07/27/2018  ? Vitamin C deficiency 05/23/2018  ? ?Past Surgical History:  ?Procedure Laterality Date  ? ABDOMINAL AORTAGRAM N/A 03/29/2012  ? Procedure: ABDOMINAL AORTAGRAM;  Surgeon: VaSerafina MitchellMD;  [facility];  Service: Cardiovascular;  Laterality: N/A;  ? ADENOIDECTOMY  age [age]? BIOPSY  05/31/2018  ? Procedure: BIOPSY;  Surgeon: JaMilus BanisterMD;  [facility];   Service: Endoscopy;;  ? CATARACT EXTRACTION W/ INTRAOCULAR LENS  IMPLANT, BILATERAL  2009  ? COLONOSCOPY  2001, 05/02/2003, 01/28/11  ? 2012: Right colon Crohn's, rectal polyp  ? COLONOSCOPY  03/31/2012  ? Procedure: COLONOSCOPY;  Surgeon: JaJerene BearsMD;  [facility] Service: G[facility]enterology;  Laterality: N/A;  ? COLONOSCOPY WITH PROPOFOL N/A 05/31/2018  ? Procedure: COLONOSCOPY WITH PROPOFOL;  Surgeon: JaOwens Loffler  P, MD;  [facility];  Service: Endoscopy;  Laterality: N/A;  ? ESOPHAGOGASTRODUODENOSCOPY  01/28/11  ? Normal  ? FOOT SURGERY Right age 21  ? MOHS SURGERY Left 11/2013  ? left ankle parakerotosis   ? PERCUTANEOUS LIVER BIOPSY  2007 and 2008  ? PILONIDAL CYST EXCISION  age 73  ? PLACEMENT OF SETON N/A 11/06/2015  ? Procedure: PLACEMENT OF SETON;  Surgeon: [name], MD;  Location[facility];  Service: General;  Laterality: N/A;  ? [place]  07/2018  ? [facility]  ? RECTAL EXAM UNDER ANESTHESIA N/A 01/18/2019  ? Procedure: ANAL EXAM UNDER ANESTHESIA,  INCISION AND DRAINAGE, SETON PLACEMENT;  Surgeon: [name], MD;  Location: [place];  Service: General;  Laterality: N/A;  ? UPPER GASTROINTESTINAL ENDOSCOPY    ? ?Social History  ? ?Social History Narrative  ? Single, history of substance abuse in recovery  ? Previously occupied on medical disability  ? 1 child  Daughter Theme park manager, lives and works in [place]  ? Elderly parents involved and he helps them  ? 1 brother  ? ?family history includes Allergies in his father; Breast cancer in his mother; Clotting disorder in his father; Heart disease in his father; Stomach cancer in his mother; Thyroid cancer in his father. ? ? ?Review of Systems ?As per HPI ? ?Objective:  ? Physical Exam ?BP 124/74   Pulse 64   Ht 6' 1"  (1.854 m)   Wt 183 lb 6.4 oz (83.2 kg)   BMI 24.20 kg/m?  ?Thin middle-aged white man in no acute distress. ?Lungs are clear ?Heart sounds are normal S1-S2 no rubs  murmurs or gallops ?The abdomen is soft and nontender without organomegaly or mass. ?Inspection of the perianal and rectal area shows a chronic draining fistula in the right posterior area.  There is a bit of stool in that are

## 2021-07-16 ENCOUNTER — Encounter: Payer: Self-pay | Admitting: Internal Medicine

## 2021-07-16 DIAGNOSIS — K5090 Crohn's disease, unspecified, without complications: Secondary | ICD-10-CM | POA: Diagnosis not present

## 2021-07-16 LAB — DRUG SCREEN 8 W/CONF, UR
Amphetamines, Urine: NEGATIVE ng/mL
BENZODIAZ UR QL: NEGATIVE ng/mL
Barbiturate screen, urine: NEGATIVE ng/mL
Buprenorphine, Urine: NEGATIVE ng/mL
Cannabinoid Quant, Ur: NEGATIVE ng/mL
Cocaine (Metab.): NEGATIVE ng/mL
Methadone Screen, Urine: NEGATIVE ng/mL
OPIATE SCREEN URINE: NEGATIVE ng/mL

## 2021-07-17 DIAGNOSIS — I81 Portal vein thrombosis: Secondary | ICD-10-CM | POA: Diagnosis not present

## 2021-07-17 LAB — AFP TUMOR MARKER: AFP-Tumor Marker: 2.8 ng/mL (ref <6.1)

## 2021-07-18 LAB — QUANTIFERON-TB GOLD PLUS
Mitogen-NIL: 4.06 IU/mL
NIL: 0.03 IU/mL
QuantiFERON-TB Gold Plus: NEGATIVE
TB1-NIL: 0 IU/mL
TB2-NIL: <0 IU/mL

## 2021-07-20 ENCOUNTER — Encounter: Payer: Self-pay | Admitting: Internal Medicine

## 2021-07-22 DIAGNOSIS — Z85828 Personal history of other malignant neoplasm of skin: Secondary | ICD-10-CM | POA: Diagnosis not present

## 2021-07-22 DIAGNOSIS — L570 Actinic keratosis: Secondary | ICD-10-CM | POA: Diagnosis not present

## 2021-07-22 DIAGNOSIS — D692 Other nonthrombocytopenic purpura: Secondary | ICD-10-CM | POA: Diagnosis not present

## 2021-07-22 DIAGNOSIS — B078 Other viral warts: Secondary | ICD-10-CM | POA: Diagnosis not present

## 2021-07-22 DIAGNOSIS — L821 Other seborrheic keratosis: Secondary | ICD-10-CM | POA: Diagnosis not present

## 2021-07-22 DIAGNOSIS — L565 Disseminated superficial actinic porokeratosis (DSAP): Secondary | ICD-10-CM | POA: Diagnosis not present

## 2021-07-22 DIAGNOSIS — D225 Melanocytic nevi of trunk: Secondary | ICD-10-CM | POA: Diagnosis not present

## 2021-07-22 DIAGNOSIS — C44619 Basal cell carcinoma of skin of left upper limb, including shoulder: Secondary | ICD-10-CM | POA: Diagnosis not present

## 2021-07-23 ENCOUNTER — Other Ambulatory Visit: Payer: Self-pay | Admitting: Internal Medicine

## 2021-07-23 ENCOUNTER — Encounter: Payer: Self-pay | Admitting: Internal Medicine

## 2021-07-23 MED ORDER — HYDROCODONE-ACETAMINOPHEN 5-325 MG PO TABS: 1.0000 | ORAL_TABLET | Freq: Four times a day (QID) | ORAL | 0 refills | Status: DC | PRN

## 2021-07-28 DIAGNOSIS — M9903 Segmental and somatic dysfunction of lumbar region: Secondary | ICD-10-CM | POA: Diagnosis not present

## 2021-07-31 ENCOUNTER — Encounter: Payer: Self-pay | Admitting: Internal Medicine

## 2021-08-04 ENCOUNTER — Other Ambulatory Visit: Payer: Self-pay | Admitting: Internal Medicine

## 2021-08-06 ENCOUNTER — Encounter: Payer: Self-pay | Admitting: Internal Medicine

## 2021-08-07 ENCOUNTER — Other Ambulatory Visit (INDEPENDENT_AMBULATORY_CARE_PROVIDER_SITE_OTHER): Payer: Medicare Other

## 2021-08-07 ENCOUNTER — Other Ambulatory Visit: Payer: Self-pay | Admitting: Internal Medicine

## 2021-08-07 DIAGNOSIS — K754 Autoimmune hepatitis: Secondary | ICD-10-CM

## 2021-08-07 DIAGNOSIS — D500 Iron deficiency anemia secondary to blood loss (chronic): Secondary | ICD-10-CM | POA: Diagnosis not present

## 2021-08-07 LAB — COMPREHENSIVE METABOLIC PANEL
ALT: 191 U/L — ABNORMAL HIGH (ref 0–53)
AST: 69 U/L — ABNORMAL HIGH (ref 0–37)
Albumin: 3.4 g/dL — ABNORMAL LOW (ref 3.5–5.2)
Alkaline Phosphatase: 240 U/L — ABNORMAL HIGH (ref 39–117)
BUN: 28 mg/dL — ABNORMAL HIGH (ref 6–23)
CO2: 27 mEq/L (ref 19–32)
Calcium: 8.6 mg/dL (ref 8.4–10.5)
Chloride: 105 mEq/L (ref 96–112)
Creatinine, Ser: 1.16 mg/dL (ref 0.40–1.50)
GFR: 69.55 mL/min (ref >60.00)
Glucose, Bld: 113 mg/dL — ABNORMAL HIGH (ref 70–99)
Potassium: 3.9 mEq/L (ref 3.5–5.1)
Sodium: 140 mEq/L (ref 135–145)
Total Bilirubin: 1 mg/dL (ref 0.2–1.2)
Total Protein: 5.8 g/dL — ABNORMAL LOW (ref 6.0–8.3)

## 2021-08-07 LAB — CBC
HCT: 43.1 % (ref 39.0–52.0)
Hemoglobin: 14.3 g/dL (ref 13.0–17.0)
MCHC: 33.2 g/dL (ref 30.0–36.0)
MCV: 93.2 fl (ref 78.0–100.0)
Platelets: 174 10*3/uL (ref 150.0–400.0)
RBC: 4.62 Mil/uL (ref 4.22–5.81)
RDW: 15.4 % (ref 11.5–15.5)
WBC: 12.7 10*3/uL — ABNORMAL HIGH (ref 4.0–10.5)

## 2021-08-07 LAB — FERRITIN: Ferritin: 229.1 ng/mL (ref 22.0–322.0)

## 2021-08-11 DIAGNOSIS — M9903 Segmental and somatic dysfunction of lumbar region: Secondary | ICD-10-CM | POA: Diagnosis not present

## 2021-08-14 DIAGNOSIS — I81 Portal vein thrombosis: Secondary | ICD-10-CM | POA: Diagnosis not present

## 2021-08-18 ENCOUNTER — Encounter: Payer: Self-pay | Admitting: Internal Medicine

## 2021-08-21 ENCOUNTER — Other Ambulatory Visit: Payer: Self-pay | Admitting: Internal Medicine

## 2021-08-21 MED ORDER — HYDROCODONE-ACETAMINOPHEN 5-325 MG PO TABS: 1.0000 | ORAL_TABLET | Freq: Four times a day (QID) | ORAL | 0 refills | Status: DC | PRN

## 2021-08-25 DIAGNOSIS — M9903 Segmental and somatic dysfunction of lumbar region: Secondary | ICD-10-CM | POA: Diagnosis not present

## 2021-08-27 DIAGNOSIS — K5090 Crohn's disease, unspecified, without complications: Secondary | ICD-10-CM | POA: Diagnosis not present

## 2021-09-02 DIAGNOSIS — Z85828 Personal history of other malignant neoplasm of skin: Secondary | ICD-10-CM | POA: Diagnosis not present

## 2021-09-02 DIAGNOSIS — C44619 Basal cell carcinoma of skin of left upper limb, including shoulder: Secondary | ICD-10-CM | POA: Diagnosis not present

## 2021-09-10 DIAGNOSIS — M9903 Segmental and somatic dysfunction of lumbar region: Secondary | ICD-10-CM | POA: Diagnosis not present

## 2021-09-10 DIAGNOSIS — I81 Portal vein thrombosis: Secondary | ICD-10-CM | POA: Diagnosis not present

## 2021-09-11 DIAGNOSIS — H524 Presbyopia: Secondary | ICD-10-CM | POA: Diagnosis not present

## 2021-09-11 DIAGNOSIS — H43813 Vitreous degeneration, bilateral: Secondary | ICD-10-CM | POA: Diagnosis not present

## 2021-09-11 DIAGNOSIS — H26493 Other secondary cataract, bilateral: Secondary | ICD-10-CM | POA: Diagnosis not present

## 2021-09-11 DIAGNOSIS — H04123 Dry eye syndrome of bilateral lacrimal glands: Secondary | ICD-10-CM | POA: Diagnosis not present

## 2021-09-16 ENCOUNTER — Encounter: Payer: Self-pay | Admitting: Internal Medicine

## 2021-09-18 ENCOUNTER — Other Ambulatory Visit: Payer: Self-pay | Admitting: Internal Medicine

## 2021-09-18 MED ORDER — HYDROCODONE-ACETAMINOPHEN 5-325 MG PO TABS: 1.0000 | ORAL_TABLET | Freq: Four times a day (QID) | ORAL | 0 refills | Status: DC | PRN

## 2021-09-24 DIAGNOSIS — M9903 Segmental and somatic dysfunction of lumbar region: Secondary | ICD-10-CM | POA: Diagnosis not present

## 2021-09-25 ENCOUNTER — Encounter: Payer: Self-pay | Admitting: Internal Medicine

## 2021-09-29 DIAGNOSIS — R531 Weakness: Secondary | ICD-10-CM | POA: Diagnosis not present

## 2021-09-29 DIAGNOSIS — R0989 Other specified symptoms and signs involving the circulatory and respiratory systems: Secondary | ICD-10-CM | POA: Diagnosis not present

## 2021-09-29 DIAGNOSIS — E561 Deficiency of vitamin K: Secondary | ICD-10-CM | POA: Diagnosis not present

## 2021-09-29 DIAGNOSIS — Z862 Personal history of diseases of the blood and blood-forming organs and certain disorders involving the immune mechanism: Secondary | ICD-10-CM | POA: Diagnosis not present

## 2021-10-08 DIAGNOSIS — K5090 Crohn's disease, unspecified, without complications: Secondary | ICD-10-CM | POA: Diagnosis not present

## 2021-10-12 DIAGNOSIS — I81 Portal vein thrombosis: Secondary | ICD-10-CM | POA: Diagnosis not present

## 2021-10-13 ENCOUNTER — Encounter: Payer: Self-pay | Admitting: Internal Medicine

## 2021-10-14 DIAGNOSIS — M9903 Segmental and somatic dysfunction of lumbar region: Secondary | ICD-10-CM | POA: Diagnosis not present

## 2021-10-15 ENCOUNTER — Other Ambulatory Visit: Payer: Self-pay | Admitting: Internal Medicine

## 2021-10-15 MED ORDER — GABAPENTIN 600 MG PO TABS: ORAL_TABLET | ORAL | 0 refills | Status: DC

## 2021-10-15 MED ORDER — PREDNISONE 20 MG PO TABS
30.0000 mg | ORAL_TABLET | Freq: Every day | ORAL | 1 refills | Status: DC
Start: 2021-10-15 — End: 2022-04-20

## 2021-10-19 ENCOUNTER — Encounter: Payer: Self-pay | Admitting: Internal Medicine

## 2021-10-19 ENCOUNTER — Other Ambulatory Visit: Payer: Self-pay | Admitting: Internal Medicine

## 2021-10-19 DIAGNOSIS — S81811A Laceration without foreign body, right lower leg, initial encounter: Secondary | ICD-10-CM | POA: Diagnosis not present

## 2021-10-19 MED ORDER — HYDROCODONE-ACETAMINOPHEN 5-325 MG PO TABS: 1.0000 | ORAL_TABLET | Freq: Four times a day (QID) | ORAL | 0 refills | Status: DC | PRN

## 2021-10-19 NOTE — Telephone Encounter (Signed)
Referral sent to [facility]: Referral along with pts records faxed to [phone], [phone]

## 2021-10-21 ENCOUNTER — Other Ambulatory Visit (INDEPENDENT_AMBULATORY_CARE_PROVIDER_SITE_OTHER): Payer: Medicare Other

## 2021-10-21 ENCOUNTER — Ambulatory Visit (INDEPENDENT_AMBULATORY_CARE_PROVIDER_SITE_OTHER): Payer: Medicare Other | Admitting: Internal Medicine

## 2021-10-21 ENCOUNTER — Ambulatory Visit (INDEPENDENT_AMBULATORY_CARE_PROVIDER_SITE_OTHER)
Admission: RE | Admit: 2021-10-21 | Discharge: 2021-10-21 | Disposition: A | Discharge status: Home / Self Care | Payer: Medicare Other | Source: Ambulatory Visit | Attending: Internal Medicine | Admitting: Internal Medicine

## 2021-10-21 ENCOUNTER — Encounter: Payer: Self-pay | Admitting: Internal Medicine

## 2021-10-21 VITALS — BP 152/66 | HR 75 | Ht 73.5 in | Wt 186.4 lb

## 2021-10-21 DIAGNOSIS — F1190 Opioid use, unspecified, uncomplicated: Secondary | ICD-10-CM

## 2021-10-21 DIAGNOSIS — Z7960 Long term (current) use of unspecified immunomodulators and immunosuppressants: Secondary | ICD-10-CM

## 2021-10-21 DIAGNOSIS — K754 Autoimmune hepatitis: Secondary | ICD-10-CM | POA: Diagnosis not present

## 2021-10-21 DIAGNOSIS — K7469 Other cirrhosis of liver: Secondary | ICD-10-CM

## 2021-10-21 DIAGNOSIS — K8301 Primary sclerosing cholangitis: Secondary | ICD-10-CM | POA: Diagnosis not present

## 2021-10-21 DIAGNOSIS — D500 Iron deficiency anemia secondary to blood loss (chronic): Secondary | ICD-10-CM

## 2021-10-21 DIAGNOSIS — K50813 Crohn's disease of both small and large intestine with fistula: Secondary | ICD-10-CM

## 2021-10-21 DIAGNOSIS — Z7952 Long term (current) use of systemic steroids: Secondary | ICD-10-CM

## 2021-10-21 LAB — COMPREHENSIVE METABOLIC PANEL
ALT: 93 U/L — ABNORMAL HIGH (ref 0–53)
AST: 48 U/L — ABNORMAL HIGH (ref 0–37)
Albumin: 3.6 g/dL (ref 3.5–5.2)
Alkaline Phosphatase: 108 U/L (ref 39–117)
BUN: 25 mg/dL — ABNORMAL HIGH (ref 6–23)
CO2: 27 mEq/L (ref 19–32)
Calcium: 8.8 mg/dL (ref 8.4–10.5)
Chloride: 104 mEq/L (ref 96–112)
Creatinine, Ser: 1.34 mg/dL (ref 0.40–1.50)
GFR: 58.42 mL/min — ABNORMAL LOW (ref >60.00)
Glucose, Bld: 103 mg/dL — ABNORMAL HIGH (ref 70–99)
Potassium: 3.9 mEq/L (ref 3.5–5.1)
Sodium: 142 mEq/L (ref 135–145)
Total Bilirubin: 0.6 mg/dL (ref 0.2–1.2)
Total Protein: 6 g/dL (ref 6.0–8.3)

## 2021-10-21 NOTE — Progress Notes (Signed)
[name] 56 y.o. April 27, 1965 [id number]  Assessment & Plan:   Encounter Diagnoses  Name Primary?   Crohn's disease of both small and large intestine with fistula ([name]) Yes   Autoimmune hepatitis ([name])    Primary sclerosing cholangitis    Long-term use of immunosuppressant medication - Entyvio    Iron deficiency anemia due to chronic blood loss    Chronic, continuous use of opioids    Long term current use of systemic steroids    Other cirrhosis of liver ([name]) by imaging not biopsy    Despite having multiple problems, [name] seems stable at this point without active issues with his autoimmune diseases.  He is tolerating his therapy.  I have asked him to try to come down on his prednisone use by alternating 30 mg with 20 mg.  That has always been difficult for him to do i.e. taper the medication.  He is aware of the potential side effects of long-term high-dose steroid use as we have reviewed these multiple times.  We will get a DEXA scan, he does say he would take Fosamax or similar medications if appropriate.  Vitamin D level was 49.6 January of this year through primary care. We will also check an MRI MRCP as below.  This is to follow-up his PSC issues.  Also screen for tumor.  Follow-up plans will be made pending these results.  Question would he need an EGD to screen for varices.  Based upon the 2021 biopsies I did not think so.    He will continue his current therapy otherwise.  We have requested at his request for recurrent Injectafer infusion for his iron deficiency.  I do not think his levels are low enough but he "feels fatigued" and is asking for an infusion when able. Orders Placed This Encounter  Procedures   DG BONE DENSITY (DXA)   MR ABDOMEN MRCP W WO CONTAST   Comprehensive metabolic panel    CC: [name], MD Dr. [name]   Subjective:   Chief Complaint: Follow-up of multiple issues-Crohn's, PSC/autoimmune hepatitis overlap, chronic  Entyvio iron deficiency anemia  HPI 56 year old white man with a long history of Crohn's disease of the large and small intestine, fistulizing, related iron deficiency anemia, autoimmune hepatitis/PSC overlap syndrome with cirrhosis features on imaging but not on last biopsy, and depression who is here for follow-up.  Last seen here in May 2023.  He has some chronic pain issues and gets 30 hydrocodone 5 mg/acetaminophen 325 mg each month.  He actually breaks these up and does not take a full pill much of the time.  He has been compliant with testing on that though sometimes I think because of his limited low-dose use the hydrocodone does not show up on the drug testing.  There are no other suspicious issues with respect to diversion etc. he has been very compliant.  [name] reports 1 or 2 bowel movements daily.  Formed.  No bleeding.  Things are good with respect to his Crohn's disease.  Continues to have drainage from his chronic fistula where his seton was.  Stool not signs of infection.  He tolerates this.  [name] is every 6 weeks, 300 mg.  [name] medical infusion center.  QuantiFERON last done May 2023 negative.  He is interested in having an iron infusion due to a decreasing ferritin though his hemoglobin is normal.  He had labs with Dr. [name] at the early part of August with ferritin of 59.5.  His hemoglobin was normal.  Iron saturation was 22%.  He had a colonoscopy in March of this year with a lot of inflammatory/pseudopolyps but the mucosa was without active colitis.  Ileum normal as well.  He injured his right leg with a handle of manual hedge tremors with a puncture wound that resulted in some sutures the other week.  That is still bandaged.   He continues on 30 mg of prednisone and tells me he would like to reduce the dose but the last time he tried "it did not go well".  He was last seen here in May and things were stable we had been discussing MRI to follow-up liver disease and he is  now inclined to pursue it.  Previous liver biopsy in 2021 did not show cirrhosis though imaging suggest cirrhosis.  Platelets have been slightly low at times but we do not have clear evidence of portal hypertension related to liver disease.  At one point he had a portal vein thrombosis that resolved.  Dr. [name] his PCP has been following up on vitamin K deficiency.  Allergies  Allergen Reactions   Asacol [Mesalamine] Diarrhea    abd pain   Flagyl [Metronidazole] Diarrhea    abd pain   Amoxicillin Other (See Comments)    Lots of gas   Azathioprine Diarrhea    abd pain   Gluten Meal Diarrhea   Metoprolol Tartrate     Stomach upset   Mycophenolate Mofetil Diarrhea    abd pain   Other     NUTS; constipation   Tylenol [Acetaminophen] Other (See Comments)   Wheat Bran Diarrhea    Constipation; flatulence; abd pain   Tape Itching and Rash    Please use "paper" tape   Current Meds  Medication Sig   Ascorbic Acid (VITAMIN C) 1000 MG tablet Take 1,000 mg by mouth daily.   Cholecalciferol (VITAMIN D) 125 MCG (5000 UT) CAPS Take 5,000 Units by mouth daily.   clonazePAM (KLONOPIN) 1 MG tablet Take 1 mg by mouth 2 (two) times daily. Takes 1/2 tab (0.27m)   diltiazem 2 % GEL Apply a pea size amount into rectum 2 times a day (Patient taking differently: Apply a pea size amount into rectum prn)   ferric carboxymaltose (INJECTAFER) 750 MG/15ML SOLN injection Infuse Day 1 and Day 7   gabapentin (NEURONTIN) 600 MG tablet TAKE 1 TABLET BY MOUTH EVERYDAY AT BEDTIME   HYDROcodone-acetaminophen (NORCO/VICODIN) 5-325 MG tablet Take 1 tablet by mouth every 6 (six) hours as needed for moderate pain.   Melatonin 3 MG CAPS Take 3 mg by mouth at bedtime as needed.   OVER THE COUNTER MEDICATION Vitamin K- 435mdaily   predniSONE (DELTASONE) 20 MG tablet Take 1.5 tablets (30 mg total) by mouth daily with breakfast.   vedolizumab (ENTYVIO) 300 MG injection Inject 300 mg into the vein every 6 (six) weeks.    verapamil (CALAN-SR) 180 MG CR tablet Take 180 mg by mouth daily.   Past Medical History:  Diagnosis Date   Allergy    Anal fistula    Anxiety    Arthritis    ? of migratory arthritis   Autoimmune hepatitis (HCLynn11/21/2014   liver function checked every 2 or 3 months sees dr geCarlean Purl Avoidant-restrictive food intake disorder (ARFID) ? 06/15/2018   Cancer of skin of neck    Cataract    Chronic mesenteric ischemia ([name])    Chronic pain syndrome 07/09/2016   Crohn's disease of  small and large intestines ([place])    followed by dr Glendell Docker Abygail Galeno   Dairy product intolerance    Diarrhea, functional    Family history of adverse reaction to anesthesia    Grover's disease    transient acantholytic dermatosis   History of alcohol abuse    History of basal cell carcinoma excision    2013 left leg   History of Clostridium difficile    10/ 2014   History of multiple concussions    x6   last one Jan 2017 per pt--  no residual   History of substance abuse ([place])    quit 1997 per pt   History of suicide attempt    05-18-2012  overdose /  failure to thrive   Iron deficiency anemia due to chronic blood loss    Major depression, recurrent, chronic ([place])    Osteopenia    Personal history of adenomatous colonic polyps 12/2010, 03/2012   12/2010 - 8 mm serrated adenoma of rectum   Portal vein thrombosis 03/21/2015   right   Primary sclerosing cholangitis    ? hepatitis overlap - liver bx x 2 and MRCP   Seasonal allergies    Substance abuse ([place]) 1997   Alcohol   Vitamin A deficiency 05/23/2018   Vitamin B6 deficiency 07/27/2018   Vitamin C deficiency 05/23/2018   Past Surgical History:  Procedure Laterality Date   ABDOMINAL AORTAGRAM N/A 03/29/2012   Procedure: ABDOMINAL Maxcine Ham;  Surgeon: Serafina Mitchell, MD;  Location: [facility] C[facility] LAB;  Service: Cardiovascular;  Laterality: N/A;   ADENOIDECTOMY  age 36   BIOPSY  05/31/2018   Procedure: BIOPSY;  Surgeon: Milus Banister, MD;  [facility];  Service: Endoscopy;;   CATARACT EXTRACTION W/ INTRAOCULAR LENS  IMPLANT, BILATERAL  2009   COLONOSCOPY  2001, 05/02/2003, 01/28/11   2012: Right colon Crohn's, rectal polyp   COLONOSCOPY  03/31/2012   Procedure: COLONOSCOPY;  Surgeon: Jerene Bears, MD;  Location: [place];  Service: Gastroenterology;  Laterality: N/A;   COLONOSCOPY WITH PROPOFOL N/A 05/31/2018   Procedure: COLONOSCOPY WITH PROPOFOL;  Surgeon: Milus Banister, MD;  [facility];  Service: Endoscopy;  Laterality: N/A;   ESOPHAGOGASTRODUODENOSCOPY  01/28/2011   Normal   FOOT SURGERY Right age 33   MOHS SURGERY Left 11/2013   left ankle parakerotosis    PERCUTANEOUS LIVER BIOPSY  2007 and 2008   PILONIDAL CYST EXCISION  age 73   [place] N/A 11/06/2015   Procedure: PLACEMENT OF SETON;  Surgeon: [name], MD;  Location: [facility];  Service: General;  Laterality: N/A;   [place]  07/2018   at [place] N/A 01/18/2019   Procedure: ANAL EXAM UNDER ANESTHESIA,  INCISION AND DRAINAGE, SETON PLACEMENT;  Surgeon: [name], MD;  Location: [place];  Service: General;  Laterality: N/A;   UPPER GASTROINTESTINAL ENDOSCOPY     Social History   Social History Narrative   Single, history of substance abuse in recovery   Previously occupied on medical disability   1 child  Daughter Theme park manager, lives and works in [place]   Elderly parents involved and he helps them   1 brother   family history includes Allergies in his father; Breast cancer in his mother; Clotting disorder in his father; Heart disease in his father; Stomach cancer in his mother; Thyroid cancer in his father.   Review of Systems See HPI  Objective:   Physical Exam BP (!) 152/66   Pulse 75   Ht 6' 1.5" (1.867 m)   Wt 186 lb 6 oz (84.5 kg)   BMI 24.26 kg/m  Thin middle-aged white man in no acute distress Lung sounds are clear after hearing  some slight crackles at the bases initially that improved with respiration Heart sounds normal S1-S2 no rubs murmurs or gallops The abdomen is soft and nontender I do not detect any hepatosplenomegaly or mass Skin is notable for multiple erythematous areas he has a bandage on the right medial leg where he had sutures I do not see stigmata of chronic liver disease  Inspection of the perianal rectal area shows his chronic draining fistula with some stool emanating, this is where he had a seton in the past.  This is stable and without complication.

## 2021-10-21 NOTE — Patient Instructions (Signed)
Your provider has requested that you go to the basement level for lab work before leaving today. Press "B" on the elevator. The lab is located at the first door on the left as you exit the elevator.  Due to recent changes in healthcare laws, you may see the results of your imaging and laboratory studies on MyChart before your provider has had a chance to review them.  We understand that in some cases there may be results that are confusing or concerning to you. Not all laboratory results come back in the same time frame and the provider may be waiting for multiple results in order to interpret others.  Please give [place] 48 hours in order for your provider to thoroughly review all the results before contacting the office for clarification of your results.    Stop by the x-ray department in the basement to set up your DEXA scan.   You will be contacted by [facility] in the next 2 days to arrange a MRI/MRCP.  The number on your caller ID will be [phone], please answer when they call.  If you have not heard from them in 2 days please call [phone] to schedule.     You have been scheduled for an MRI /MRCP at __________________ on _________________. Your appointment time is __________________. Please arrive to admitting (at main entrance of the hospital) 30 minutes prior to your appointment time for registration purposes. Please make certain not to have anything to eat or drink 6 hours prior to your test. In addition, if you have any metal in your body, have a pacemaker or defibrillator, please be sure to let your ordering physician know. This test typically takes 45 minutes to 1 hour to complete. Should you need to reschedule, please call [phone] to do so.   I appreciate the opportunity to care for you. Silvano Rusk, MD, [facility]

## 2021-10-27 DIAGNOSIS — M9903 Segmental and somatic dysfunction of lumbar region: Secondary | ICD-10-CM | POA: Diagnosis not present

## 2021-10-30 ENCOUNTER — Ambulatory Visit (HOSPITAL_COMMUNITY): Payer: Medicare Other

## 2021-10-31 ENCOUNTER — Ambulatory Visit (HOSPITAL_COMMUNITY)
Admission: RE | Admit: 2021-10-31 | Discharge: 2021-10-31 | Disposition: A | Discharge status: Home / Self Care | Payer: Medicare Other | Source: Ambulatory Visit | Attending: Internal Medicine | Admitting: Internal Medicine

## 2021-10-31 ENCOUNTER — Other Ambulatory Visit: Payer: Self-pay | Admitting: Internal Medicine

## 2021-10-31 DIAGNOSIS — K8301 Primary sclerosing cholangitis: Secondary | ICD-10-CM | POA: Insufficient documentation

## 2021-10-31 DIAGNOSIS — I700 Atherosclerosis of aorta: Secondary | ICD-10-CM | POA: Diagnosis not present

## 2021-10-31 DIAGNOSIS — R932 Abnormal findings on diagnostic imaging of liver and biliary tract: Secondary | ICD-10-CM | POA: Diagnosis not present

## 2021-10-31 DIAGNOSIS — R935 Abnormal findings on diagnostic imaging of other abdominal regions, including retroperitoneum: Secondary | ICD-10-CM | POA: Diagnosis not present

## 2021-10-31 MED ORDER — GADOBUTROL 1 MMOL/ML IV SOLN
8.0000 mL | Freq: Once | INTRAVENOUS | Status: AC | PRN
  Administered 2021-10-31: 8 mL via INTRAVENOUS

## 2021-11-03 ENCOUNTER — Encounter: Payer: Self-pay | Admitting: Internal Medicine

## 2021-11-04 DIAGNOSIS — Z862 Personal history of diseases of the blood and blood-forming organs and certain disorders involving the immune mechanism: Secondary | ICD-10-CM | POA: Diagnosis not present

## 2021-11-04 DIAGNOSIS — E561 Deficiency of vitamin K: Secondary | ICD-10-CM | POA: Diagnosis not present

## 2021-11-05 ENCOUNTER — Encounter: Payer: Self-pay | Admitting: Internal Medicine

## 2021-11-10 ENCOUNTER — Telehealth: Payer: Self-pay

## 2021-11-10 DIAGNOSIS — M9903 Segmental and somatic dysfunction of lumbar region: Secondary | ICD-10-CM | POA: Diagnosis not present

## 2021-11-10 NOTE — Telephone Encounter (Signed)
Request for iron infusion faxed to [facility] medical: [phone]

## 2021-11-13 DIAGNOSIS — I81 Portal vein thrombosis: Secondary | ICD-10-CM | POA: Diagnosis not present

## 2021-11-16 ENCOUNTER — Encounter: Payer: Self-pay | Admitting: Internal Medicine

## 2021-11-18 ENCOUNTER — Encounter: Payer: Self-pay | Admitting: Internal Medicine

## 2021-11-18 ENCOUNTER — Other Ambulatory Visit: Payer: Self-pay | Admitting: Internal Medicine

## 2021-11-18 MED ORDER — HYDROCODONE-ACETAMINOPHEN 5-325 MG PO TABS: 1.0000 | ORAL_TABLET | Freq: Four times a day (QID) | ORAL | 0 refills | Status: DC | PRN

## 2021-11-19 DIAGNOSIS — D509 Iron deficiency anemia, unspecified: Secondary | ICD-10-CM | POA: Diagnosis not present

## 2021-11-24 DIAGNOSIS — M9903 Segmental and somatic dysfunction of lumbar region: Secondary | ICD-10-CM | POA: Diagnosis not present

## 2021-11-26 DIAGNOSIS — D509 Iron deficiency anemia, unspecified: Secondary | ICD-10-CM | POA: Diagnosis not present

## 2021-12-08 DIAGNOSIS — M9903 Segmental and somatic dysfunction of lumbar region: Secondary | ICD-10-CM | POA: Diagnosis not present

## 2021-12-11 ENCOUNTER — Encounter: Payer: Self-pay | Admitting: Internal Medicine

## 2021-12-14 ENCOUNTER — Encounter: Payer: Self-pay | Admitting: Internal Medicine

## 2021-12-14 DIAGNOSIS — I81 Portal vein thrombosis: Secondary | ICD-10-CM | POA: Diagnosis not present

## 2021-12-17 ENCOUNTER — Other Ambulatory Visit: Payer: Self-pay | Admitting: Internal Medicine

## 2021-12-17 ENCOUNTER — Encounter: Payer: Self-pay | Admitting: Internal Medicine

## 2021-12-17 MED ORDER — HYDROCODONE-ACETAMINOPHEN 5-325 MG PO TABS: 1.0000 | ORAL_TABLET | Freq: Four times a day (QID) | ORAL | 0 refills | Status: DC | PRN

## 2021-12-22 DIAGNOSIS — M9903 Segmental and somatic dysfunction of lumbar region: Secondary | ICD-10-CM | POA: Diagnosis not present

## 2021-12-31 DIAGNOSIS — K5090 Crohn's disease, unspecified, without complications: Secondary | ICD-10-CM | POA: Diagnosis not present

## 2022-01-05 DIAGNOSIS — M9903 Segmental and somatic dysfunction of lumbar region: Secondary | ICD-10-CM | POA: Diagnosis not present

## 2022-01-12 ENCOUNTER — Encounter: Payer: Self-pay | Admitting: Internal Medicine

## 2022-01-14 DIAGNOSIS — I81 Portal vein thrombosis: Secondary | ICD-10-CM | POA: Diagnosis not present

## 2022-01-15 ENCOUNTER — Other Ambulatory Visit: Payer: Self-pay | Admitting: Internal Medicine

## 2022-01-15 MED ORDER — HYDROCODONE-ACETAMINOPHEN 5-325 MG PO TABS: 1.0000 | ORAL_TABLET | Freq: Four times a day (QID) | ORAL | 0 refills | Status: DC | PRN

## 2022-01-19 DIAGNOSIS — M9903 Segmental and somatic dysfunction of lumbar region: Secondary | ICD-10-CM | POA: Diagnosis not present

## 2022-01-27 ENCOUNTER — Other Ambulatory Visit: Payer: Self-pay | Admitting: Internal Medicine

## 2022-01-27 DIAGNOSIS — L570 Actinic keratosis: Secondary | ICD-10-CM | POA: Diagnosis not present

## 2022-01-27 DIAGNOSIS — L821 Other seborrheic keratosis: Secondary | ICD-10-CM | POA: Diagnosis not present

## 2022-01-27 DIAGNOSIS — Z85828 Personal history of other malignant neoplasm of skin: Secondary | ICD-10-CM | POA: Diagnosis not present

## 2022-01-27 DIAGNOSIS — L111 Transient acantholytic dermatosis [Grover]: Secondary | ICD-10-CM | POA: Diagnosis not present

## 2022-01-27 DIAGNOSIS — C44719 Basal cell carcinoma of skin of left lower limb, including hip: Secondary | ICD-10-CM | POA: Diagnosis not present

## 2022-01-27 NOTE — Telephone Encounter (Signed)
Okay to refill Sir? Thank you.

## 2022-02-02 DIAGNOSIS — M9903 Segmental and somatic dysfunction of lumbar region: Secondary | ICD-10-CM | POA: Diagnosis not present

## 2022-02-09 ENCOUNTER — Encounter: Payer: Self-pay | Admitting: Internal Medicine

## 2022-02-10 ENCOUNTER — Encounter: Payer: Self-pay | Admitting: Internal Medicine

## 2022-02-11 DIAGNOSIS — K5090 Crohn's disease, unspecified, without complications: Secondary | ICD-10-CM | POA: Diagnosis not present

## 2022-02-12 ENCOUNTER — Other Ambulatory Visit: Payer: Self-pay | Admitting: Internal Medicine

## 2022-02-12 DIAGNOSIS — I81 Portal vein thrombosis: Secondary | ICD-10-CM | POA: Diagnosis not present

## 2022-02-12 MED ORDER — HYDROCODONE-ACETAMINOPHEN 5-325 MG PO TABS: 1.0000 | ORAL_TABLET | Freq: Four times a day (QID) | ORAL | 0 refills | Status: DC | PRN

## 2022-02-16 DIAGNOSIS — M9903 Segmental and somatic dysfunction of lumbar region: Secondary | ICD-10-CM | POA: Diagnosis not present

## 2022-02-19 DIAGNOSIS — Z6825 Body mass index (BMI) 25.0-25.9, adult: Secondary | ICD-10-CM | POA: Diagnosis not present

## 2022-02-19 DIAGNOSIS — Z0000 Encounter for general adult medical examination without abnormal findings: Secondary | ICD-10-CM | POA: Diagnosis not present

## 2022-03-03 DIAGNOSIS — M9903 Segmental and somatic dysfunction of lumbar region: Secondary | ICD-10-CM | POA: Diagnosis not present

## 2022-03-10 ENCOUNTER — Encounter: Payer: Self-pay | Admitting: Internal Medicine

## 2022-03-11 ENCOUNTER — Ambulatory Visit (INDEPENDENT_AMBULATORY_CARE_PROVIDER_SITE_OTHER): Payer: Medicare Other | Admitting: Internal Medicine

## 2022-03-11 ENCOUNTER — Other Ambulatory Visit (INDEPENDENT_AMBULATORY_CARE_PROVIDER_SITE_OTHER): Payer: Medicare Other

## 2022-03-11 ENCOUNTER — Encounter: Payer: Self-pay | Admitting: Internal Medicine

## 2022-03-11 VITALS — BP 140/90 | HR 89 | Ht 73.5 in | Wt 186.2 lb

## 2022-03-11 DIAGNOSIS — K754 Autoimmune hepatitis: Secondary | ICD-10-CM

## 2022-03-11 DIAGNOSIS — K7460 Unspecified cirrhosis of liver: Secondary | ICD-10-CM

## 2022-03-11 DIAGNOSIS — Z7960 Long term (current) use of unspecified immunomodulators and immunosuppressants: Secondary | ICD-10-CM

## 2022-03-11 DIAGNOSIS — Z7952 Long term (current) use of systemic steroids: Secondary | ICD-10-CM

## 2022-03-11 DIAGNOSIS — D500 Iron deficiency anemia secondary to blood loss (chronic): Secondary | ICD-10-CM | POA: Diagnosis not present

## 2022-03-11 DIAGNOSIS — K8301 Primary sclerosing cholangitis: Secondary | ICD-10-CM

## 2022-03-11 DIAGNOSIS — F1190 Opioid use, unspecified, uncomplicated: Secondary | ICD-10-CM

## 2022-03-11 DIAGNOSIS — K50813 Crohn's disease of both small and large intestine with fistula: Secondary | ICD-10-CM | POA: Diagnosis not present

## 2022-03-11 LAB — COMPREHENSIVE METABOLIC PANEL
ALT: 127 U/L — ABNORMAL HIGH (ref 0–53)
AST: 67 U/L — ABNORMAL HIGH (ref 0–37)
Albumin: 3.8 g/dL (ref 3.5–5.2)
Alkaline Phosphatase: 153 U/L — ABNORMAL HIGH (ref 39–117)
BUN: 21 mg/dL (ref 6–23)
CO2: 28 mEq/L (ref 19–32)
Calcium: 8.8 mg/dL (ref 8.4–10.5)
Chloride: 104 mEq/L (ref 96–112)
Creatinine, Ser: 1.26 mg/dL (ref 0.40–1.50)
GFR: 62.72 mL/min (ref >60.00)
Glucose, Bld: 206 mg/dL — ABNORMAL HIGH (ref 70–99)
Potassium: 3.9 mEq/L (ref 3.5–5.1)
Sodium: 144 mEq/L (ref 135–145)
Total Bilirubin: 0.7 mg/dL (ref 0.2–1.2)
Total Protein: 6.3 g/dL (ref 6.0–8.3)

## 2022-03-11 LAB — CBC WITH DIFFERENTIAL/PLATELET
Basophils Absolute: 0 10*3/uL (ref 0.0–0.1)
Basophils Relative: 0.1 % (ref 0.0–3.0)
Eosinophils Absolute: 0 10*3/uL (ref 0.0–0.7)
Eosinophils Relative: 0 % (ref 0.0–5.0)
HCT: 48.8 % (ref 39.0–52.0)
Hemoglobin: 16.5 g/dL (ref 13.0–17.0)
Lymphocytes Relative: 2.1 % — ABNORMAL LOW (ref 12.0–46.0)
Lymphs Abs: 0.3 10*3/uL — ABNORMAL LOW (ref 0.7–4.0)
MCHC: 33.9 g/dL (ref 30.0–36.0)
MCV: 97.8 fl (ref 78.0–100.0)
Monocytes Absolute: 0.3 10*3/uL (ref 0.1–1.0)
Monocytes Relative: 2.3 % — ABNORMAL LOW (ref 3.0–12.0)
Neutro Abs: 12.3 10*3/uL — ABNORMAL HIGH (ref 1.4–7.7)
Neutrophils Relative %: 95.5 % — ABNORMAL HIGH (ref 43.0–77.0)
Platelets: 154 10*3/uL (ref 150.0–400.0)
RBC: 4.99 Mil/uL (ref 4.22–5.81)
RDW: 14.7 % (ref 11.5–15.5)
WBC: 12.8 10*3/uL — ABNORMAL HIGH (ref 4.0–10.5)

## 2022-03-11 LAB — PROTIME-INR
INR: 1.1 ratio — ABNORMAL HIGH (ref 0.8–1.0)
Prothrombin Time: 12.4 s (ref 9.6–13.1)

## 2022-03-11 MED ORDER — HYDROCODONE-ACETAMINOPHEN 5-325 MG PO TABS: 1.0000 | ORAL_TABLET | Freq: Four times a day (QID) | ORAL | 0 refills | Status: DC | PRN

## 2022-03-11 NOTE — Progress Notes (Signed)
[name] 57 y.o. 10/07/1965 [id number]  Assessment & Plan:   Encounter Diagnoses  Name Primary?   Crohn's disease of both small and large intestine with fistula ([place]) Yes   Autoimmune hepatitis ([place])    Primary sclerosing cholangitis    Cirrhosis of liver without ascites, unspecified hepatic cirrhosis type ([name])    Long-term use of immunosuppressant medication - Entyvio    Iron deficiency anemia due to chronic blood loss    Long term current use of systemic steroids    Chronic, continuous use of opioids    [name] seems to be doing well.  We will continue steroids at his current dose though I asked him to try to cut down to 20 mg when he felt able.  He is well aware of the potential side effects and problems of steroids and chooses to continue at this dose.  We will continue Entyvio as well.  He continues to be compliant with a monthly hydrocodone supply and he uses that judiciously to treat some chronic pain issues.   Orders Placed This Encounter  Procedures   CBC with Differential/Platelet   Comprehensive metabolic panel   AFP tumor marker   Protime-INR   Iron, TIBC and Ferritin Panel   Meds ordered this encounter  Medications   HYDROcodone-acetaminophen (NORCO/VICODIN) 5-325 MG tablet    Sig: Take 1 tablet by mouth every 6 (six) hours as needed for moderate pain.    Dispense:  30 tablet    Refill:  0      Subjective:   Chief Complaint: Follow-up of Crohn's disease, autoimmune hepatitis PSC overlap with cirrhosis  HPI 57 year old white man here for follow-up of above problems. Not having any Crohn's or liver issues.  1-2 formed stools a day.  No fistula drainage at the anus.  Staying on 30 mg of prednisone and unwilling to go lower.  Perhaps after he gets through 7 doctor visits in the next several weeks.  Home life challenging father is demented and resisting help with medications and he has had some difficult interactions with his brother about this.   Dad is 63 and has dementia.  Mom is stable but still requires help.  He is having a lot of allergy issues.  Some periorbital edema noted lots of nasal discharge.  Previously saw allergy, Dr. Norva Riffle closed his practice and [name] does not feel like follow-up there would provide any difference and he says he is doing better with things.  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 [name] coming up.       Allergies  Allergen Reactions   Asacol [Mesalamine] Diarrhea    abd pain   Flagyl [Metronidazole] Diarrhea    abd pain   Amoxicillin Other (See Comments)    Lots of gas   Azathioprine Diarrhea    abd pain   Gluten Meal Diarrhea   Metoprolol Tartrate     Stomach upset   Mycophenolate Mofetil Diarrhea    abd pain   Other     NUTS; constipation   Tylenol [Acetaminophen] Other (See Comments)   Wheat Bran Diarrhea    Constipation; flatulence; abd pain   Tape Itching and Rash    Please use "paper" tape   Current Meds  Medication Sig   Ascorbic Acid (VITAMIN C) 1000 MG tablet Take 1,000 mg by mouth daily.   Cholecalciferol (VITAMIN D) 125 MCG (5000 UT) CAPS Take 5,000 Units by mouth daily.  clonazePAM (KLONOPIN) 1 MG tablet Take 1 mg by mouth 2 (two) times daily. Takes 1/2 tab (0.'5mg'$ )   diltiazem 2 % GEL Apply a pea size amount into rectum 2 times a day (Patient taking differently: Apply a pea size amount into rectum prn)   ferric carboxymaltose (INJECTAFER) 750 MG/15ML SOLN injection Infuse Day 1 and Day 7   gabapentin (NEURONTIN) 600 MG tablet TAKE 1 TABLET BY MOUTH EVERYDAY AT BEDTIME   Melatonin 3 MG CAPS Take 3 mg by mouth at bedtime as needed.   OVER THE COUNTER MEDICATION Vitamin K- '45mg'$  daily   predniSONE (DELTASONE) 20 MG tablet Take 1.5 tablets (30 mg total) by mouth daily with breakfast.   vedolizumab (ENTYVIO) 300 MG injection Inject 300 mg into the vein every 6 (six) weeks.   [DISCONTINUED] HYDROcodone-acetaminophen (NORCO/VICODIN)  5-325 MG tablet Take 1 tablet by mouth every 6 (six) hours as needed for moderate pain.   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 06/15/2018   Cancer of skin of neck    Cataract    Chronic mesenteric ischemia ([name])    Chronic pain syndrome 07/09/2016   Crohn's disease of small and large intestines ([place])    followed by dr Glendell Docker Eren Ryser   Dairy product intolerance    Diarrhea, functional    Family history of adverse reaction to anesthesia    Grover's disease    transient acantholytic dermatosis   History of alcohol abuse    History of basal cell carcinoma excision    2013 left leg   History of Clostridium difficile    10/ 2014   History of multiple concussions    x6   last one Jan 2017 per pt--  no residual   History of substance abuse ([place])    quit 1997 per pt   History of suicide attempt    05-18-2012  overdose /  failure to thrive   Iron deficiency anemia due to chronic blood loss    Major depression, recurrent, chronic ([place])    Osteopenia    Personal history of adenomatous colonic polyps 12/2010, 03/2012   12/2010 - 8 mm serrated adenoma of rectum   Portal vein thrombosis 03/21/2015   right   Primary sclerosing cholangitis    ? hepatitis overlap - liver bx x 2 and MRCP   Seasonal allergies    Substance abuse ([place]) 1997   Alcohol   Vitamin A deficiency 05/23/2018   Vitamin B6 deficiency 07/27/2018   Vitamin C deficiency 05/23/2018   Past Surgical History:  Procedure Laterality Date   ABDOMINAL AORTAGRAM N/A 03/29/2012   Procedure: ABDOMINAL Maxcine Ham;  Surgeon: Serafina Mitchell, MD;  Location: [facility] C[facility] LAB;  Service: Cardiovascular;  Laterality: N/A;   ADENOIDECTOMY  age 78   BIOPSY  05/31/2018   Procedure: BIOPSY;  Surgeon: Milus Banister, MD;  Location: [facility] ENDOSCOPY;  Service: Endoscopy;;   CATARACT EXTRACTION W/ INTRAOCULAR LENS  IMPLANT, BILATERAL  2009   COLONOSCOPY  2001, 05/02/2003, 01/28/11   2012: Right colon Crohn's, rectal polyp   COLONOSCOPY  03/31/2012   Procedure: COLONOSCOPY;  Surgeon: Jerene Bears, MD;  Location: [place];  Service: Gastroenterology;  Laterality: N/A;   COLONOSCOPY WITH  PROPOFOL N/A 05/31/2018   Procedure: COLONOSCOPY WITH PROPOFOL;  Surgeon: Milus Banister, MD;  Location: [facility];  Service: Endoscopy;  Laterality: N/A;   ESOPHAGOGASTRODUODENOSCOPY  01/28/2011   Normal   FOOT SURGERY Right age 74   MOHS SURGERY Left 11/2013   left ankle parakerotosis    PERCUTANEOUS LIVER BIOPSY  2007 and 2008   PILONIDAL CYST EXCISION  age 55   [place] N/A 11/06/2015   Procedure: PLACEMENT OF SETON;  Surgeon: [name], MD;  Location: [facility];  Service: General;  Laterality: N/A;   [place]  07/2018   at [place] N/A 01/18/2019   Procedure: ANAL EXAM UNDER ANESTHESIA,  INCISION AND DRAINAGE, SETON PLACEMENT;  Surgeon: [name], MD;  Location: [facility];  Service: General;  Laterality: N/A;   UPPER GASTROINTESTINAL ENDOSCOPY     Social History   Social History Narrative   Single, history of substance abuse in recovery   Previously occupied on medical disability   1 child  Daughter Theme park manager, lives and works in [employer]   Elderly parents involved and he helps them   1 brother   family history includes Allergies in his father; Breast cancer in his mother; Clotting disorder in his father; Heart disease in his father; Stomach cancer in his mother; Thyroid cancer in his father.   Review of Systems As per HPI  Objective:   Physical Exam BP (!) 140/90   Pulse 89   Ht 6' 1.5" (1.867 m)   Wt 186 lb 4 oz (84.5 kg)   BMI 24.24 kg/m  Chronically ill middle-aged white man in no acute  distress There is some infraorbital edema, puffiness below the eyes today no scleral or conjunctival changes and no icterus Lungs are clear to auscultation throughout Heart with S1-S2 no rubs murmurs or gallops Abdomen is soft, prominent veins seen on the abdominal wall, I cannot palpate any hepatosplenomegaly.  He is nontender bowel sounds present. Extremities there is a bandaged abrasion on the right pretibial area, there is no cyanosis clubbing or edema Patient is alert and oriented x 3 with an appropriate mood and affect

## 2022-03-11 NOTE — Patient Instructions (Signed)
Your provider has requested that you go to the basement level for lab work before leaving today. Press "B" on the elevator. The lab is located at the first door on the left as you exit the elevator.   We have sent the following medications to your pharmacy for you to pick up at your convenience: Hydrocodone   Due to recent changes in healthcare laws, you may see the results of your imaging and laboratory studies on MyChart before your provider has had a chance to review them.  We understand that in some cases there may be results that are confusing or concerning to you. Not all laboratory results come back in the same time frame and the provider may be waiting for multiple results in order to interpret others.  Please give [place] 48 hours in order for your provider to thoroughly review all the results before contacting the office for clarification of your results.   I appreciate the opportunity to care for you. Silvano Rusk, MD, [facility]

## 2022-03-15 ENCOUNTER — Encounter: Payer: Self-pay | Admitting: Internal Medicine

## 2022-03-15 DIAGNOSIS — E099 Drug or chemical induced diabetes mellitus without complications: Secondary | ICD-10-CM | POA: Insufficient documentation

## 2022-03-15 HISTORY — DX: Drug or chemical induced diabetes mellitus without complications: E09.9

## 2022-03-15 LAB — AFP TUMOR MARKER: AFP-Tumor Marker: 3 ng/mL (ref <6.1)

## 2022-03-16 ENCOUNTER — Encounter: Payer: Self-pay | Admitting: Internal Medicine

## 2022-03-16 DIAGNOSIS — I81 Portal vein thrombosis: Secondary | ICD-10-CM | POA: Diagnosis not present

## 2022-03-17 ENCOUNTER — Encounter: Payer: Self-pay | Admitting: Internal Medicine

## 2022-03-17 DIAGNOSIS — M9903 Segmental and somatic dysfunction of lumbar region: Secondary | ICD-10-CM | POA: Diagnosis not present

## 2022-03-17 DIAGNOSIS — D500 Iron deficiency anemia secondary to blood loss (chronic): Secondary | ICD-10-CM

## 2022-03-17 DIAGNOSIS — K50813 Crohn's disease of both small and large intestine with fistula: Secondary | ICD-10-CM

## 2022-03-19 ENCOUNTER — Other Ambulatory Visit (INDEPENDENT_AMBULATORY_CARE_PROVIDER_SITE_OTHER): Payer: Medicare Other

## 2022-03-19 DIAGNOSIS — K50813 Crohn's disease of both small and large intestine with fistula: Secondary | ICD-10-CM | POA: Diagnosis not present

## 2022-03-19 DIAGNOSIS — D500 Iron deficiency anemia secondary to blood loss (chronic): Secondary | ICD-10-CM

## 2022-03-19 LAB — IBC + FERRITIN
Ferritin: 442.6 ng/mL — ABNORMAL HIGH (ref 22.0–322.0)
Iron: 82 ug/dL (ref 42–165)
Saturation Ratios: 31 % (ref 20.0–50.0)
TIBC: 264.6 ug/dL (ref 250.0–450.0)
Transferrin: 189 mg/dL — ABNORMAL LOW (ref 212.0–360.0)

## 2022-03-24 ENCOUNTER — Encounter: Payer: Self-pay | Admitting: Internal Medicine

## 2022-03-25 DIAGNOSIS — K5090 Crohn's disease, unspecified, without complications: Secondary | ICD-10-CM | POA: Diagnosis not present

## 2022-03-30 DIAGNOSIS — M9903 Segmental and somatic dysfunction of lumbar region: Secondary | ICD-10-CM | POA: Diagnosis not present

## 2022-04-07 ENCOUNTER — Encounter: Payer: Self-pay | Admitting: Internal Medicine

## 2022-04-08 ENCOUNTER — Other Ambulatory Visit: Payer: Self-pay | Admitting: Internal Medicine

## 2022-04-08 DIAGNOSIS — E559 Vitamin D deficiency, unspecified: Secondary | ICD-10-CM | POA: Diagnosis not present

## 2022-04-08 DIAGNOSIS — Z6824 Body mass index (BMI) 24.0-24.9, adult: Secondary | ICD-10-CM | POA: Diagnosis not present

## 2022-04-08 DIAGNOSIS — R7301 Impaired fasting glucose: Secondary | ICD-10-CM | POA: Diagnosis not present

## 2022-04-08 DIAGNOSIS — F411 Generalized anxiety disorder: Secondary | ICD-10-CM | POA: Diagnosis not present

## 2022-04-08 DIAGNOSIS — E7800 Pure hypercholesterolemia, unspecified: Secondary | ICD-10-CM | POA: Diagnosis not present

## 2022-04-08 DIAGNOSIS — Z79899 Other long term (current) drug therapy: Secondary | ICD-10-CM | POA: Diagnosis not present

## 2022-04-08 DIAGNOSIS — G4700 Insomnia, unspecified: Secondary | ICD-10-CM | POA: Diagnosis not present

## 2022-04-08 DIAGNOSIS — Z125 Encounter for screening for malignant neoplasm of prostate: Secondary | ICD-10-CM | POA: Diagnosis not present

## 2022-04-08 MED ORDER — HYDROCODONE-ACETAMINOPHEN 5-325 MG PO TABS: 1.0000 | ORAL_TABLET | Freq: Four times a day (QID) | ORAL | 0 refills | Status: DC | PRN

## 2022-04-13 DIAGNOSIS — M9903 Segmental and somatic dysfunction of lumbar region: Secondary | ICD-10-CM | POA: Diagnosis not present

## 2022-04-15 DIAGNOSIS — I81 Portal vein thrombosis: Secondary | ICD-10-CM | POA: Diagnosis not present

## 2022-04-20 ENCOUNTER — Other Ambulatory Visit: Payer: Self-pay | Admitting: Internal Medicine

## 2022-04-28 DIAGNOSIS — M9903 Segmental and somatic dysfunction of lumbar region: Secondary | ICD-10-CM | POA: Diagnosis not present

## 2022-05-02 ENCOUNTER — Encounter: Payer: Self-pay | Admitting: Internal Medicine

## 2022-05-02 ENCOUNTER — Other Ambulatory Visit: Payer: Self-pay | Admitting: Internal Medicine

## 2022-05-04 ENCOUNTER — Ambulatory Visit (INDEPENDENT_AMBULATORY_CARE_PROVIDER_SITE_OTHER): Payer: Medicare Other | Admitting: Allergy and Immunology

## 2022-05-04 ENCOUNTER — Encounter: Payer: Self-pay | Admitting: Internal Medicine

## 2022-05-04 ENCOUNTER — Encounter: Payer: Self-pay | Admitting: Allergy and Immunology

## 2022-05-04 ENCOUNTER — Telehealth: Payer: Self-pay

## 2022-05-04 VITALS — BP 126/82 | HR 77 | Temp 98.0°F | Resp 16 | Ht 73.5 in | Wt 185.9 lb

## 2022-05-04 DIAGNOSIS — L281 Prurigo nodularis: Secondary | ICD-10-CM

## 2022-05-04 DIAGNOSIS — J310 Chronic rhinitis: Secondary | ICD-10-CM | POA: Diagnosis not present

## 2022-05-04 DIAGNOSIS — D84821 Immunodeficiency due to drugs: Secondary | ICD-10-CM | POA: Diagnosis not present

## 2022-05-04 DIAGNOSIS — Z79899 Other long term (current) drug therapy: Secondary | ICD-10-CM | POA: Diagnosis not present

## 2022-05-04 MED ORDER — IPRATROPIUM BROMIDE 0.06 % NA SOLN: 2.0000 | Freq: Three times a day (TID) | NASAL | 5 refills | Status: DC

## 2022-05-04 NOTE — Telephone Encounter (Signed)
Patient came in today and signed consent for [place]. Patient is to wait to hear from you to see about approval.

## 2022-05-04 NOTE — Progress Notes (Signed)
[place] - [place] - [place] - [place] - [place]   Dear [name],  Thank you for referring [name] to the [place] [facility] [place] on 05/04/2022.   Below is a summation of this patient's evaluation and recommendations.  Thank you for your referral. I will keep you informed about this patient's response to treatment.   If you have any questions please do not hesitate to contact me.   Sincerely,  Jiles Prows, MD Allergy / Immunology [place] [facility]   ______________________________________________________________________    NEW PATIENT NOTE  Referring Provider: [name], MD Primary Provider: [name], MD Date of office visit: 05/04/2022    Subjective:   Chief Complaint:  [name] (DOB: 1965/04/29) is a 57 y.o. male who presents to the clinic on 05/04/2022 with a chief complaint of Other (Wants his IGG levels checked. ) .     HPI: [name] presents to this clinic in evaluation of possible allergic disease directed against food.  He has had a evaluation in the past with [name] for problems eating.  He has a history of Perrone's disease complicated by what sounds like sclerosing cholangitis and autoimmune hepatitis since a very early adulthood age for which he has been on prednisone greater than a decade and has been treated with Humira initially and is presently using vendolizumab in conjunction with prednisone 15 mg day.  His problem with eating is that he is very limited in his ability to eat and he has found that he develops a fair amount of upper airway mucus production while eating.  This appears to occur with some of his limited food repertoire.  For example he has found that this is happening with [place] consumption and he is now back to eating his stable of chicken.  He does not really have any other associated atopic or constitutional or systemic  symptoms when eating.  He also has a problem with a inflammatory dermatosis involving his back.  He has been called Grovers disease but basically it is itchy patches that he excoriates since 2017.  He is trying some triamcinolone which has not really resulting in good control of this issue.  It involves predominantly the shoulder and back. Past Medical History:  Diagnosis Date  . Allergy   . Anal fistula   . Anxiety   . 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 Autoimmune hepatitis ([place]) 01/19/2013   liver function checked every 2 or 3 months sees dr Carlean Purl  . Avoidant-restrictive food intake disorder (ARFID) ? 06/15/2018  . Cancer of skin of neck   . Cataract   . Chronic mesenteric ischemia ([place])   . Chronic pain syndrome 07/09/2016  . Crohn's disease of small and large intestines ([place])    followed by dr Glendell Docker [place]  . Dairy product intolerance   . Diarrhea, functional   . Family history of adverse reaction to anesthesia   . Grover's disease    transient acantholytic dermatosis  . History of alcohol abuse   . History of basal cell carcinoma excision    2013 left leg  . History of Clostridium difficile    10/ 2014  . History of multiple concussions    x6   last one Jan 2017 per pt--  no residual  . History of substance abuse ([place])    quit 1997 per pt  . History of suicide attempt  05-18-2012  overdose /  failure to thrive  . Iron deficiency anemia due to chronic blood loss   . Major depression, recurrent, chronic ([place])   . Osteopenia   . Personal history of adenomatous colonic polyps 12/2010, 03/2012   12/2010 - 8 mm serrated adenoma of rectum  . Portal vein thrombosis 03/21/2015   right  . Primary sclerosing cholangitis    ? hepatitis overlap - liver bx x 2 and MRCP  . Seasonal allergies   . Steroid-induced diabetes ([place]) 03/15/2022  . Substance abuse ([place]) 1997   Alcohol  . Vitamin A deficiency 05/23/2018  . Vitamin B6 deficiency 07/27/2018  . Vitamin C  deficiency 05/23/2018    Past Surgical History:  Procedure Laterality Date  . ABDOMINAL AORTAGRAM N/A 03/29/2012   Procedure: ABDOMINAL AORTAGRAM;  Surgeon: Serafina Mitchell, MD;  Location: [facility] CATH LAB;  Service: Cardiovascular;  Laterality: N/A;  . ADENOIDECTOMY  age 12  . BIOPSY  05/31/2018   Procedure: BIOPSY;  Surgeon: Milus Banister, MD;  Location: [facility];  Service: Endoscopy;;  . CATARACT EXTRACTION W/ INTRAOCULAR LENS  IMPLANT, BILATERAL  2009  . COLONOSCOPY  2001, 05/02/2003, 01/28/11   2012: Right colon Crohn's, rectal polyp  . COLONOSCOPY  03/31/2012   Procedure: COLONOSCOPY;  Surgeon: Jerene Bears, MD;  Location: [place];  Service: Gastroenterology;  Laterality: N/A;  . COLONOSCOPY WITH PROPOFOL N/A 05/31/2018   Procedure: COLONOSCOPY WITH PROPOFOL;  Surgeon: Milus Banister, MD;  [facility]: [facility];  Service: Endoscopy;  Laterality: N/A;  . ESOPHAGOGASTRODUODENOSCOPY  01/28/2011   Normal  . FOOT SURGERY Right age 73  . MOHS SURGERY Left 11/2013   left ankle parakerotosis   . PERCUTANEOUS LIVER BIOPSY  2007 and 2008  . PILONIDAL CYST EXCISION  age 40  . PLACEMENT OF SETON N/A 11/06/2015   Procedure: PLACEMENT OF SETON;  Surgeon: [name], MD;  Location: [facility];  Service: General;  Laterality: N/A;  . PLACEMENT OF SETON  07/2018   at [facility]  . RECTAL EXAM UNDER ANESTHESIA N/A 01/18/2019   Procedure: ANAL EXAM UNDER ANESTHESIA,  INCISION AND DRAINAGE, SETON PLACEMENT;  Surgeon: [name], MD;  Location: [facility] [place];  Service: General;  Laterality: N/A;  . UPPER GASTROINTESTINAL ENDOSCOPY      Allergies as of 05/04/2022       Reactions   Asacol [mesalamine] Diarrhea   abd pain   Azathioprine Diarrhea   abd pain   Flagyl [metronidazole] Diarrhea   abd pain   Gluten Meal Diarrhea   Metoprolol Tartrate Nausea Only   Stomach upset   Mycophenolate Mofetil Diarrhea   abd pain   Other Other (See Comments)    NUTS; constipation   Wheat Bran Diarrhea   Constipation; flatulence; abd pain   Amoxicillin Other (See Comments)   Lots of gas   Tape Itching, Rash   Please use "paper" tape   Tylenol [acetaminophen] Other (See Comments)        Medication List    clonazePAM 1 MG tablet Commonly known as: KLONOPIN Take 1 mg by mouth 2 (two) times daily. Takes 1/2 tab (0.'5mg'$ )   diltiazem 2 % Gel Apply a pea size amount into rectum 2 times a day What changed: additional instructions   Entyvio 300 MG injection Generic drug: vedolizumab Inject 300 mg into the vein every 6 (six) weeks.   gabapentin 600 MG tablet Commonly known as: NEURONTIN TAKE 1 TABLET BY MOUTH EVERYDAY AT  BEDTIME   HYDROcodone-acetaminophen 5-325 MG tablet Commonly known as: NORCO/VICODIN Take 1 tablet by mouth every 6 (six) hours as needed for moderate pain.   Injectafer 750 MG/15ML Soln injection Generic drug: ferric carboxymaltose Infuse Day 1 and Day 7   Melatonin 3 MG Caps Take 3 mg by mouth at bedtime as needed.   Nasacort Allergy 24HR 55 MCG/ACT Aero nasal inhaler Generic drug: triamcinolone Place 2 sprays into the nose daily.   OVER THE COUNTER MEDICATION Vitamin K- '45mg'$  daily   predniSONE 20 MG tablet Commonly known as: DELTASONE Take 1-1.5 tablets (20-30 mg total) by mouth daily with breakfast.   traZODone 50 MG tablet Commonly known as: DESYREL Take 1 tablet (50 mg total) by mouth at bedtime.   vitamin C 1000 MG tablet Take 1,000 mg by mouth daily.   Vitamin D 125 MCG (5000 UT) Caps Take 5,000 Units by mouth daily.    Review of systems negative except as noted in HPI / PMHx or noted below:  Review of Systems  Constitutional: Negative.   HENT: Negative.    Eyes: Negative.   Respiratory: Negative.    Cardiovascular: Negative.   Gastrointestinal: Negative.   Genitourinary: Negative.   Musculoskeletal: Negative.   Skin: Negative.   Neurological: Negative.   Endo/Heme/Allergies:  Negative.   Psychiatric/Behavioral: Negative.      Family History  Problem Relation Age of Onset  . Heart disease Father   . Thyroid cancer Father   . Allergies Father   . Clotting disorder Father   . Breast cancer Mother   . Stomach cancer Mother   . Colon cancer Neg Hx   . Esophageal cancer Neg Hx   . Rectal cancer Neg Hx     Social History   Socioeconomic History  . Marital status: Single    Spouse name: Not on file  . Number of children: 1  . Years of education: Not on file  . Highest education level: Not on file  Occupational History  . Occupation: Disability   Tobacco Use  . Smoking status: Former    Packs/day: 1.00    Years: 15.00    Total pack years: 15.00    Types: Cigarettes    Quit date: 03/28/1997    Years since quitting: 25.1  . Smokeless tobacco: Never  Vaping Use  . Vaping Use: Never used  Substance and Sexual Activity  . Alcohol use: No    Alcohol/week: 0.0 standard drinks of alcohol  . Drug use: Not Currently    Types: Cocaine, Marijuana    Comment: QUIT USING DRUGS IN 1997  . Sexual activity: Not Currently  Other Topics Concern  . Not on file  Social History Narrative   Single, history of substance abuse in recovery   Previously occupied on medical disability   1 child  Daughter Theme park manager, lives and works in [employer]   Elderly parents involved and he helps them   1 brother   Social Determinants of Health   Financial Resource Strain: Low Risk  (08/28/2019)   Overall Financial Resource Strain ([place])   . Difficulty of Paying Living Expenses: Not hard at all  Food Insecurity: No Food Insecurity (08/28/2019)   Hunger Vital Sign   . Worried About Charity fundraiser in the Last Year: Never true   . Ran Out of Food in the Last Year: Never true  Transportation Needs: No Transportation Needs (08/28/2019)   PRAPARE - Transportation   . Lack of Transportation (Medical): No   .  Lack of Transportation (Non-Medical): No  Physical Activity:  Insufficiently Active (11/08/2019)   Exercise Vital Sign   . Days of Exercise per Week: 3 days   . Minutes of Exercise per Session: 30 min  Stress: Stress Concern Present (08/28/2019)   [place]   . Feeling of Stress : Very much  Social Connections: Socially Isolated (11/08/2019)   Social Connection and Isolation Panel [NHANES]   . Frequency of Communication with Friends and Family: Three times a week   . Frequency of Social Gatherings with Friends and Family: Twice a week   . Attends Religious Services: Never   . Active Member of Clubs or Organizations: No   . Attends Archivist Meetings: Never   . Marital Status: Divorced  Human resources officer Violence: Not At Risk (11/08/2019)   Humiliation, Afraid, Rape, and Kick questionnaire   . Fear of Current or Ex-Partner: No   . Emotionally Abused: No   . Physically Abused: No   . Sexually Abused: No    Environmental and Social history  Lives in a house with a dry environment, no animals located inside the household, carpet in the bedroom, no plastic on the bed, no plastic on the pillow, no smoking ongoing with inside the household.  Objective:   Vitals:   05/04/22 0926  BP: 126/82  Pulse: 77  Resp: 16  Temp: 98 F (36.7 C)  SpO2: 98%   Height: 6' 1.5" (186.7 cm) Weight: 185 lb 14.4 oz (84.3 kg)  Physical Exam Constitutional:      Appearance: He is not diaphoretic.  HENT:     Head: Normocephalic.     Right Ear: Tympanic membrane, ear canal and external ear normal.     Left Ear: Tympanic membrane, ear canal and external ear normal.     Nose: Nose normal. No mucosal edema or rhinorrhea.     Mouth/Throat:     Pharynx: Uvula midline. No oropharyngeal exudate.  Eyes:     Conjunctiva/sclera: Conjunctivae normal.  Neck:     Thyroid: No thyromegaly.     Trachea: Trachea normal. No tracheal tenderness or tracheal deviation.  Cardiovascular:     Rate and  Rhythm: Normal rate and regular rhythm.     Heart sounds: Normal heart sounds, S1 normal and S2 normal. No murmur heard. Pulmonary:     Effort: No respiratory distress.     Breath sounds: Normal breath sounds. No stridor. No wheezing or rales.  Lymphadenopathy:     Head:     Right side of head: No tonsillar adenopathy.     Left side of head: No tonsillar adenopathy.     Cervical: No cervical adenopathy.  Skin:    Findings: No erythema or rash (Multiple erythematous raised lesions with some nodular features affecting upper back shoulders).     Nails: There is no clubbing.  Neurological:     Mental Status: He is alert.    Diagnostics: Allergy skin tests were performed.   Assessment and Plan:    1. Prurigo nodularis   2. Gustatory rhinitis     Patient Instructions   1. Can use ipratropium 0.06% - 2 sprays each nostril prior to eating.   2. Submit for dupilumab administration to treat prurigo nodularis    3. Review all previous testing from [name]  4. Further evaluation???     Jiles Prows, MD Allergy / Immunology [place] [facility] [place]

## 2022-05-04 NOTE — Patient Instructions (Addendum)
  1. Can use ipratropium 0.06% - 2 sprays each nostril prior to eating.   2. Submit for dupilumab administration to treat prurigo nodularis    3. Review all previous testing from [name]  4. Further evaluation???

## 2022-05-05 ENCOUNTER — Encounter: Payer: Self-pay | Admitting: Allergy and Immunology

## 2022-05-05 NOTE — Telephone Encounter (Signed)
Called patient and advised approval and submit to [facility] for [place]  for prurigo nodularis. Instructed on delivery, storage, dosing and in clinic initial injection

## 2022-05-06 ENCOUNTER — Other Ambulatory Visit: Payer: Self-pay | Admitting: Internal Medicine

## 2022-05-06 DIAGNOSIS — K5090 Crohn's disease, unspecified, without complications: Secondary | ICD-10-CM | POA: Diagnosis not present

## 2022-05-06 MED ORDER — HYDROCODONE-ACETAMINOPHEN 5-325 MG PO TABS: 1.0000 | ORAL_TABLET | Freq: Four times a day (QID) | ORAL | 0 refills | Status: DC | PRN

## 2022-05-11 DIAGNOSIS — M9903 Segmental and somatic dysfunction of lumbar region: Secondary | ICD-10-CM | POA: Diagnosis not present

## 2022-05-12 NOTE — Telephone Encounter (Signed)
Patient called and states pharmacy informed him the prescription sent in for his Dupixent was a "maintenance" dose when it needed to be a "loading dose". Patients shipment is delayed until this is fixed.

## 2022-05-13 ENCOUNTER — Ambulatory Visit: Payer: Medicare Other

## 2022-05-14 ENCOUNTER — Ambulatory Visit (INDEPENDENT_AMBULATORY_CARE_PROVIDER_SITE_OTHER): Payer: Medicare Other

## 2022-05-14 DIAGNOSIS — L281 Prurigo nodularis: Secondary | ICD-10-CM | POA: Diagnosis not present

## 2022-05-14 MED ORDER — DUPILUMAB 300 MG/2ML ~~LOC~~ SOSY
300.0000 mg | PREFILLED_SYRINGE | Freq: Once | SUBCUTANEOUS | Status: AC
  Administered 2022-05-14: 300 mg via SUBCUTANEOUS

## 2022-05-14 NOTE — Telephone Encounter (Signed)
Clarification given to [facility]

## 2022-05-14 NOTE — Progress Notes (Signed)
Immunotherapy   Patient Details  Name: [name] MRN: [id number] Date of Birth: [birth year]  05/14/2022  [place] started injections for   Dupixent  Frequency: Every 2 Weeks   Consent signed and patient instructions given.  Patient waited 30 minutes in office with no reaction to the injection. Patient brought in his Dupixent injection and had it out 30 minutes prior to his appointment. Patient left the other injection in his refrigerator.    [place]: [id number] LOT: [id number] EXP: 06/28/24   Larence Penning 05/14/2022, 4:02 PM

## 2022-05-17 DIAGNOSIS — I81 Portal vein thrombosis: Secondary | ICD-10-CM | POA: Diagnosis not present

## 2022-05-19 DIAGNOSIS — L308 Other specified dermatitis: Secondary | ICD-10-CM | POA: Diagnosis not present

## 2022-05-19 DIAGNOSIS — Z85828 Personal history of other malignant neoplasm of skin: Secondary | ICD-10-CM | POA: Diagnosis not present

## 2022-05-19 DIAGNOSIS — L281 Prurigo nodularis: Secondary | ICD-10-CM | POA: Diagnosis not present

## 2022-05-19 DIAGNOSIS — D485 Neoplasm of uncertain behavior of skin: Secondary | ICD-10-CM | POA: Diagnosis not present

## 2022-05-25 DIAGNOSIS — M9903 Segmental and somatic dysfunction of lumbar region: Secondary | ICD-10-CM | POA: Diagnosis not present

## 2022-05-31 ENCOUNTER — Ambulatory Visit: Payer: Medicare Other | Admitting: *Deleted

## 2022-05-31 DIAGNOSIS — L281 Prurigo nodularis: Secondary | ICD-10-CM

## 2022-05-31 MED ORDER — DUPILUMAB 300 MG/2ML ~~LOC~~ SOSY
300.0000 mg | PREFILLED_SYRINGE | Freq: Once | SUBCUTANEOUS | Status: AC
  Administered 2022-05-31: 300 mg via SUBCUTANEOUS

## 2022-05-31 NOTE — Progress Notes (Signed)
Immunotherapy   Patient Details  Name: [name] MRN: [id number] Date of Birth: 1966/02/27  05/31/2022  Patient self administered Dupixent 300mg  in the left lower abdomen and will continue to self administer at home every 2 weeks.    Nathanal Hermiz Fernandez-[place] 05/31/2022, 11:06 AM

## 2022-06-01 ENCOUNTER — Encounter: Payer: Self-pay | Admitting: Internal Medicine

## 2022-06-03 ENCOUNTER — Other Ambulatory Visit: Payer: Self-pay | Admitting: Internal Medicine

## 2022-06-03 MED ORDER — HYDROCODONE-ACETAMINOPHEN 5-325 MG PO TABS: 1.0000 | ORAL_TABLET | Freq: Four times a day (QID) | ORAL | 0 refills | Status: DC | PRN

## 2022-06-08 DIAGNOSIS — M9903 Segmental and somatic dysfunction of lumbar region: Secondary | ICD-10-CM | POA: Diagnosis not present

## 2022-06-14 DIAGNOSIS — I81 Portal vein thrombosis: Secondary | ICD-10-CM | POA: Diagnosis not present

## 2022-06-17 DIAGNOSIS — K5090 Crohn's disease, unspecified, without complications: Secondary | ICD-10-CM | POA: Diagnosis not present

## 2022-06-22 DIAGNOSIS — M9903 Segmental and somatic dysfunction of lumbar region: Secondary | ICD-10-CM | POA: Diagnosis not present

## 2022-06-23 ENCOUNTER — Encounter: Payer: Self-pay | Admitting: Internal Medicine

## 2022-06-24 ENCOUNTER — Other Ambulatory Visit: Payer: Self-pay | Admitting: Internal Medicine

## 2022-06-24 DIAGNOSIS — K754 Autoimmune hepatitis: Secondary | ICD-10-CM

## 2022-06-24 DIAGNOSIS — D500 Iron deficiency anemia secondary to blood loss (chronic): Secondary | ICD-10-CM

## 2022-06-25 ENCOUNTER — Other Ambulatory Visit (INDEPENDENT_AMBULATORY_CARE_PROVIDER_SITE_OTHER): Payer: Medicare Other

## 2022-06-25 DIAGNOSIS — D500 Iron deficiency anemia secondary to blood loss (chronic): Secondary | ICD-10-CM | POA: Diagnosis not present

## 2022-06-25 DIAGNOSIS — K754 Autoimmune hepatitis: Secondary | ICD-10-CM

## 2022-06-25 LAB — CBC
HCT: 46.9 % (ref 39.0–52.0)
Hemoglobin: 15.8 g/dL (ref 13.0–17.0)
MCHC: 33.7 g/dL (ref 30.0–36.0)
MCV: 95.3 fl (ref 78.0–100.0)
Platelets: 161 10*3/uL (ref 150.0–400.0)
RBC: 4.92 Mil/uL (ref 4.22–5.81)
RDW: 13.9 % (ref 11.5–15.5)
WBC: 10.1 10*3/uL (ref 4.0–10.5)

## 2022-06-25 LAB — IBC + FERRITIN
Ferritin: 199.2 ng/mL (ref 22.0–322.0)
Iron: 60 ug/dL (ref 42–165)
Saturation Ratios: 23.7 % (ref 20.0–50.0)
TIBC: 253.4 ug/dL (ref 250.0–450.0)
Transferrin: 181 mg/dL — ABNORMAL LOW (ref 212.0–360.0)

## 2022-06-25 LAB — COMPREHENSIVE METABOLIC PANEL
ALT: 72 U/L — ABNORMAL HIGH (ref 0–53)
AST: 30 U/L (ref 0–37)
Albumin: 3.3 g/dL — ABNORMAL LOW (ref 3.5–5.2)
Alkaline Phosphatase: 102 U/L (ref 39–117)
BUN: 23 mg/dL (ref 6–23)
CO2: 28 mEq/L (ref 19–32)
Calcium: 8.6 mg/dL (ref 8.4–10.5)
Chloride: 103 mEq/L (ref 96–112)
Creatinine, Ser: 1.16 mg/dL (ref 0.40–1.50)
GFR: 69.13 mL/min (ref >60.00)
Glucose, Bld: 138 mg/dL — ABNORMAL HIGH (ref 70–99)
Potassium: 3.9 mEq/L (ref 3.5–5.1)
Sodium: 140 mEq/L (ref 135–145)
Total Bilirubin: 0.5 mg/dL (ref 0.2–1.2)
Total Protein: 5.8 g/dL — ABNORMAL LOW (ref 6.0–8.3)

## 2022-06-29 ENCOUNTER — Encounter: Payer: Self-pay | Admitting: Internal Medicine

## 2022-07-01 ENCOUNTER — Other Ambulatory Visit: Payer: Self-pay | Admitting: Internal Medicine

## 2022-07-01 MED ORDER — HYDROCODONE-ACETAMINOPHEN 5-325 MG PO TABS: 1.0000 | ORAL_TABLET | Freq: Four times a day (QID) | ORAL | 0 refills | Status: DC | PRN

## 2022-07-06 DIAGNOSIS — M9903 Segmental and somatic dysfunction of lumbar region: Secondary | ICD-10-CM | POA: Diagnosis not present

## 2022-07-14 DIAGNOSIS — I81 Portal vein thrombosis: Secondary | ICD-10-CM | POA: Diagnosis not present

## 2022-07-20 DIAGNOSIS — M9903 Segmental and somatic dysfunction of lumbar region: Secondary | ICD-10-CM | POA: Diagnosis not present

## 2022-07-22 ENCOUNTER — Other Ambulatory Visit: Payer: Self-pay | Admitting: Internal Medicine

## 2022-07-22 NOTE — Telephone Encounter (Signed)
Spoke with [name] and set him up for 11/19/2022 and told him we refilled his rx.

## 2022-07-22 NOTE — Telephone Encounter (Signed)
I refilled it - have him set up a next available f/u

## 2022-07-22 NOTE — Telephone Encounter (Signed)
Please advise Sir, thank you. 

## 2022-07-28 ENCOUNTER — Telehealth: Payer: Self-pay

## 2022-07-28 ENCOUNTER — Encounter: Payer: Self-pay | Admitting: Internal Medicine

## 2022-07-28 DIAGNOSIS — L814 Other melanin hyperpigmentation: Secondary | ICD-10-CM | POA: Diagnosis not present

## 2022-07-28 DIAGNOSIS — Z85828 Personal history of other malignant neoplasm of skin: Secondary | ICD-10-CM | POA: Diagnosis not present

## 2022-07-28 DIAGNOSIS — L821 Other seborrheic keratosis: Secondary | ICD-10-CM | POA: Diagnosis not present

## 2022-07-28 DIAGNOSIS — C44519 Basal cell carcinoma of skin of other part of trunk: Secondary | ICD-10-CM | POA: Diagnosis not present

## 2022-07-28 DIAGNOSIS — L570 Actinic keratosis: Secondary | ICD-10-CM | POA: Diagnosis not present

## 2022-07-28 NOTE — Telephone Encounter (Signed)
In Dr Marvell Fuller absence could you please refill his [facility]cod[facility] to [facility] on 07/30/2022. Thank you so much.

## 2022-07-29 DIAGNOSIS — K5090 Crohn's disease, unspecified, without complications: Secondary | ICD-10-CM | POA: Diagnosis not present

## 2022-07-30 ENCOUNTER — Other Ambulatory Visit: Payer: Self-pay | Admitting: Physician Assistant

## 2022-07-30 ENCOUNTER — Other Ambulatory Visit: Payer: Self-pay | Admitting: Internal Medicine

## 2022-07-30 DIAGNOSIS — K50813 Crohn's disease of both small and large intestine with fistula: Secondary | ICD-10-CM

## 2022-07-30 DIAGNOSIS — K8301 Primary sclerosing cholangitis: Secondary | ICD-10-CM

## 2022-07-30 MED ORDER — HYDROCODONE-ACETAMINOPHEN 5-325 MG PO TABS: 1.0000 | ORAL_TABLET | Freq: Four times a day (QID) | ORAL | 0 refills | Status: DC | PRN

## 2022-07-30 NOTE — Progress Notes (Unsigned)
Refill for Dr. Leone Payor

## 2022-07-30 NOTE — Telephone Encounter (Signed)
Amy Esterwood PA-C sent in the rx and [facility] called [facility] and they said he has picked it up.

## 2022-08-10 DIAGNOSIS — M9903 Segmental and somatic dysfunction of lumbar region: Secondary | ICD-10-CM | POA: Diagnosis not present

## 2022-08-23 ENCOUNTER — Encounter: Payer: Self-pay | Admitting: Internal Medicine

## 2022-08-24 ENCOUNTER — Other Ambulatory Visit (INDEPENDENT_AMBULATORY_CARE_PROVIDER_SITE_OTHER): Payer: Medicare Other

## 2022-08-24 ENCOUNTER — Other Ambulatory Visit: Payer: Self-pay | Admitting: Internal Medicine

## 2022-08-24 ENCOUNTER — Encounter: Payer: Self-pay | Admitting: Internal Medicine

## 2022-08-24 DIAGNOSIS — K8301 Primary sclerosing cholangitis: Secondary | ICD-10-CM | POA: Diagnosis not present

## 2022-08-24 DIAGNOSIS — K7460 Unspecified cirrhosis of liver: Secondary | ICD-10-CM

## 2022-08-24 LAB — COMPREHENSIVE METABOLIC PANEL
ALT: 59 U/L — ABNORMAL HIGH (ref 0–53)
AST: 26 U/L (ref 0–37)
Albumin: 3.5 g/dL (ref 3.5–5.2)
Alkaline Phosphatase: 92 U/L (ref 39–117)
BUN: 24 mg/dL — ABNORMAL HIGH (ref 6–23)
CO2: 27 mEq/L (ref 19–32)
Calcium: 8.8 mg/dL (ref 8.4–10.5)
Chloride: 104 mEq/L (ref 96–112)
Creatinine, Ser: 1.11 mg/dL (ref 0.40–1.50)
GFR: 72.8 mL/min (ref >60.00)
Glucose, Bld: 185 mg/dL — ABNORMAL HIGH (ref 70–99)
Potassium: 4.3 mEq/L (ref 3.5–5.1)
Sodium: 139 mEq/L (ref 135–145)
Total Bilirubin: 0.6 mg/dL (ref 0.2–1.2)
Total Protein: 5.9 g/dL — ABNORMAL LOW (ref 6.0–8.3)

## 2022-08-24 LAB — CBC
HCT: 48.8 % (ref 39.0–52.0)
Hemoglobin: 16.3 g/dL (ref 13.0–17.0)
MCHC: 33.5 g/dL (ref 30.0–36.0)
MCV: 94.2 fl (ref 78.0–100.0)
Platelets: 149 10*3/uL — ABNORMAL LOW (ref 150.0–400.0)
RBC: 5.18 Mil/uL (ref 4.22–5.81)
RDW: 13.9 % (ref 11.5–15.5)
WBC: 10.4 10*3/uL (ref 4.0–10.5)

## 2022-08-24 LAB — PROTIME-INR
INR: 1.2 ratio — ABNORMAL HIGH (ref 0.8–1.0)
Prothrombin Time: 12.7 s (ref 9.6–13.1)

## 2022-08-25 DIAGNOSIS — M9903 Segmental and somatic dysfunction of lumbar region: Secondary | ICD-10-CM | POA: Diagnosis not present

## 2022-08-25 LAB — AFP TUMOR MARKER: AFP-Tumor Marker: 2.4 ng/mL (ref <6.1)

## 2022-08-26 ENCOUNTER — Other Ambulatory Visit: Payer: Self-pay | Admitting: Internal Medicine

## 2022-08-27 ENCOUNTER — Other Ambulatory Visit: Payer: Self-pay | Admitting: Internal Medicine

## 2022-08-27 MED ORDER — HYDROCODONE-ACETAMINOPHEN 5-325 MG PO TABS: 1.0000 | ORAL_TABLET | Freq: Four times a day (QID) | ORAL | 0 refills | Status: DC | PRN

## 2022-08-27 NOTE — Progress Notes (Signed)
Med refill

## 2022-09-07 DIAGNOSIS — M9903 Segmental and somatic dysfunction of lumbar region: Secondary | ICD-10-CM | POA: Diagnosis not present

## 2022-09-09 DIAGNOSIS — K5090 Crohn's disease, unspecified, without complications: Secondary | ICD-10-CM | POA: Diagnosis not present

## 2022-09-10 ENCOUNTER — Other Ambulatory Visit: Payer: Self-pay | Admitting: Internal Medicine

## 2022-09-13 DIAGNOSIS — I81 Portal vein thrombosis: Secondary | ICD-10-CM | POA: Diagnosis not present

## 2022-09-17 DIAGNOSIS — H43813 Vitreous degeneration, bilateral: Secondary | ICD-10-CM | POA: Diagnosis not present

## 2022-09-17 DIAGNOSIS — H26493 Other secondary cataract, bilateral: Secondary | ICD-10-CM | POA: Diagnosis not present

## 2022-09-17 DIAGNOSIS — H04123 Dry eye syndrome of bilateral lacrimal glands: Secondary | ICD-10-CM | POA: Diagnosis not present

## 2022-09-17 DIAGNOSIS — H524 Presbyopia: Secondary | ICD-10-CM | POA: Diagnosis not present

## 2022-09-17 DIAGNOSIS — H52203 Unspecified astigmatism, bilateral: Secondary | ICD-10-CM | POA: Diagnosis not present

## 2022-09-21 ENCOUNTER — Encounter: Payer: Self-pay | Admitting: Internal Medicine

## 2022-09-21 DIAGNOSIS — M9903 Segmental and somatic dysfunction of lumbar region: Secondary | ICD-10-CM | POA: Diagnosis not present

## 2022-09-22 DIAGNOSIS — Z6823 Body mass index (BMI) 23.0-23.9, adult: Secondary | ICD-10-CM | POA: Diagnosis not present

## 2022-09-22 DIAGNOSIS — N4889 Other specified disorders of penis: Secondary | ICD-10-CM | POA: Diagnosis not present

## 2022-09-22 DIAGNOSIS — L309 Dermatitis, unspecified: Secondary | ICD-10-CM | POA: Diagnosis not present

## 2022-09-24 ENCOUNTER — Other Ambulatory Visit: Payer: Self-pay | Admitting: Internal Medicine

## 2022-09-24 MED ORDER — HYDROCODONE-ACETAMINOPHEN 5-325 MG PO TABS: 1.0000 | ORAL_TABLET | Freq: Four times a day (QID) | ORAL | 0 refills | Status: DC | PRN

## 2022-09-27 ENCOUNTER — Telehealth: Payer: Self-pay | Admitting: Nurse Practitioner

## 2022-09-27 DIAGNOSIS — K5000 Crohn's disease of small intestine without complications: Secondary | ICD-10-CM

## 2022-09-27 MED ORDER — HYDROCODONE-ACETAMINOPHEN 5-325 MG PO TABS: 1.0000 | ORAL_TABLET | Freq: Four times a day (QID) | ORAL | 0 refills | Status: DC | PRN

## 2022-09-27 MED ORDER — HYDROCODONE-ACETAMINOPHEN 5-325 MG PO TABS
1.0000 | ORAL_TABLET | Freq: Four times a day (QID) | ORAL | 0 refills | Status: DC | PRN
Start: 2022-09-27 — End: 2022-09-27

## 2022-09-27 NOTE — Telephone Encounter (Signed)
Failed attempt to send hydrocodone to pharmacy

## 2022-09-27 NOTE — Telephone Encounter (Signed)
Refilled opioid therapy on behalf of my partner Dr. Leone Payor.  Corliss Parish, MD [facility] Office # [phone]

## 2022-09-27 NOTE — Telephone Encounter (Signed)
Error sending Rx

## 2022-09-27 NOTE — Addendum Note (Signed)
Addended by: Corliss Parish on: 09/27/2022 05:28 PM   Modules accepted: Orders

## 2022-09-27 NOTE — Addendum Note (Signed)
Addended by: Meredith Pel on: 09/27/2022 05:23 PM   Modules accepted: Orders

## 2022-09-27 NOTE — Telephone Encounter (Signed)
Pt was contacted and discussed message that was sent. Pt was notified to reach out to the [place] [facility] and request that they [facility] the [facility]. Pt verbalized understanding with all questions answered.

## 2022-10-04 ENCOUNTER — Encounter: Payer: Self-pay | Admitting: Internal Medicine

## 2022-10-05 DIAGNOSIS — M9903 Segmental and somatic dysfunction of lumbar region: Secondary | ICD-10-CM | POA: Diagnosis not present

## 2022-10-07 ENCOUNTER — Other Ambulatory Visit: Payer: Self-pay | Admitting: Internal Medicine

## 2022-10-07 MED ORDER — METRONIDAZOLE 500 MG PO TABS: 500.0000 mg | ORAL_TABLET | Freq: Three times a day (TID) | ORAL | 0 refills | Status: DC

## 2022-10-19 ENCOUNTER — Encounter: Payer: Self-pay | Admitting: Allergy and Immunology

## 2022-10-20 DIAGNOSIS — M9903 Segmental and somatic dysfunction of lumbar region: Secondary | ICD-10-CM | POA: Diagnosis not present

## 2022-10-21 DIAGNOSIS — Z6823 Body mass index (BMI) 23.0-23.9, adult: Secondary | ICD-10-CM | POA: Diagnosis not present

## 2022-10-21 DIAGNOSIS — F411 Generalized anxiety disorder: Secondary | ICD-10-CM | POA: Diagnosis not present

## 2022-10-21 DIAGNOSIS — B49 Unspecified mycosis: Secondary | ICD-10-CM | POA: Diagnosis not present

## 2022-10-21 DIAGNOSIS — K5090 Crohn's disease, unspecified, without complications: Secondary | ICD-10-CM | POA: Diagnosis not present

## 2022-10-25 ENCOUNTER — Encounter: Payer: Self-pay | Admitting: Internal Medicine

## 2022-10-28 ENCOUNTER — Other Ambulatory Visit: Payer: Self-pay | Admitting: Internal Medicine

## 2022-10-28 MED ORDER — HYDROCODONE-ACETAMINOPHEN 5-325 MG PO TABS: 1.0000 | ORAL_TABLET | Freq: Four times a day (QID) | ORAL | 0 refills | Status: DC | PRN

## 2022-10-29 ENCOUNTER — Encounter: Payer: Self-pay | Admitting: Internal Medicine

## 2022-10-29 ENCOUNTER — Other Ambulatory Visit: Payer: Self-pay | Admitting: Internal Medicine

## 2022-11-02 ENCOUNTER — Telehealth: Payer: Self-pay | Admitting: Internal Medicine

## 2022-11-02 MED ORDER — AMBULATORY NON FORMULARY MEDICATION: 5 refills | Status: DC

## 2022-11-02 NOTE — Telephone Encounter (Signed)
Spoke to [place] at [facility]  She can make acetaminophen w/ filler.  First suggestion was microcrystalline cellulose but [name] says he cannot take that. She does not use povidone as he had requested.  [name] will review with her more and we will try to come up with an Rx of acetaminophen 500 mg tid w/ a filler e can tolerate.  I am waiting to hear.

## 2022-11-02 NOTE — Telephone Encounter (Signed)
Pending Prescription was called into [name] at [facility].

## 2022-11-02 NOTE — Telephone Encounter (Signed)
Please call in pending Rx to [facility] in [place]  [phone]  I could not find it in the pharmacy list

## 2022-11-03 ENCOUNTER — Encounter (HOSPITAL_BASED_OUTPATIENT_CLINIC_OR_DEPARTMENT_OTHER): Payer: Self-pay | Admitting: Emergency Medicine

## 2022-11-03 ENCOUNTER — Emergency Department (HOSPITAL_BASED_OUTPATIENT_CLINIC_OR_DEPARTMENT_OTHER)
Admission: EM | Admit: 2022-11-03 | Discharge: 2022-11-03 | Disposition: A | Discharge status: Home / Self Care | Payer: Medicare Other | Attending: Emergency Medicine | Admitting: Emergency Medicine

## 2022-11-03 ENCOUNTER — Other Ambulatory Visit: Payer: Self-pay

## 2022-11-03 DIAGNOSIS — M25572 Pain in left ankle and joints of left foot: Secondary | ICD-10-CM | POA: Insufficient documentation

## 2022-11-03 DIAGNOSIS — M9903 Segmental and somatic dysfunction of lumbar region: Secondary | ICD-10-CM | POA: Diagnosis not present

## 2022-11-03 DIAGNOSIS — M7662 Achilles tendinitis, left leg: Secondary | ICD-10-CM

## 2022-11-03 NOTE — Discharge Instructions (Addendum)
You were seen in the emergency room today for left ankle pain.  Take your at home pain medication as needed.  I would continue wearing the ankle brace.  Do light stretching and apply ice to ankle.  Continues please follow-up with the orthopedic doctor that you have seen in the past.   Return to the emergency room with new or worsening symptoms.

## 2022-11-03 NOTE — ED Provider Notes (Signed)
[place] EMERGENCY DEPARTMENT AT [facility] Provider Note   CSN: [id number] Arrival date & time: 11/03/22  1251     History  Chief Complaint  Patient presents with   Ankle Pain    [name] is a 57 y.o. male presenting with 1 week of bilateral Achilles tendon insertion tenderness.  He has been trying ankle braces.  Which have helped with his symptoms.  He did push a trash can up a hill today which aggravated the left Achilles.  He heard popping when he was walking up a hill today while pushing a trash can.  He reports he did not fall he just momentarily slipped, but this incident did not cause any acute worsening of symptoms. no problems with weightbearing or range of motion.  But he is concerned about Achilles rupture.  He has past medical history of Crohn's disease and is on chronic steroids.  Reports he is currently having no symptoms, no pain.     Ankle Pain      Home Medications Prior to Admission medications   Medication Sig Start Date End Date Taking? Authorizing Provider  AMBULATORY NON FORMULARY MEDICATION Medication Name: acetaminophen 500 mg - compounded with microcrystalline cellulose as binder 11/02/22   Iva Boop, MD  Ascorbic Acid (VITAMIN C) 1000 MG tablet Take 1,000 mg by mouth daily.    [provider]  Cholecalciferol (VITAMIN D) 125 MCG (5000 UT) CAPS Take 5,000 Units by mouth daily.    [provider]  clonazePAM (KLONOPIN) 0.5 MG tablet Take 0.5 mg by mouth 2 (two) times daily. 09/20/22   [provider]  clonazePAM (KLONOPIN) 1 MG tablet Take 1 mg by mouth 2 (two) times daily. Takes 1/2 tab (0.5mg )    [provider]  diltiazem 2 % GEL Apply a pea size amount into rectum 2 times a day Patient taking differently: Apply a pea size amount into rectum prn 09/24/20   Iva Boop, MD  DUPIXENT 300 MG/2ML prefilled syringe Inject 300 mg into the skin every 14 (fourteen) days. 09/09/22   [provider]  ferric carboxymaltose (INJECTAFER) 750 MG/15ML SOLN injection Infuse Day 1 and Day 7 02/16/21   Iva Boop, MD  gabapentin (NEURONTIN) 600 MG tablet TAKE 1 TABLET BY MOUTH EVERYDAY AT BEDTIME 07/22/22   Iva Boop, MD  HYDROcodone-acetaminophen (NORCO/VICODIN) 5-325 MG tablet Take 1 tablet by mouth every 6 (six) hours as needed for moderate pain. 10/28/22   Iva Boop, MD  ipratropium (ATROVENT) 0.06 % nasal spray Place 2 sprays into both nostrils 3 (three) times daily. Use nasal spray before consuming foods to help with mucus. 05/04/22   Kozlow, Alvira Philips, MD  Melatonin 3 MG CAPS Take 3 mg by mouth at bedtime as needed.    [provider]  metroNIDAZOLE (FLAGYL) 500 MG tablet Take 1 tablet (500 mg total) by mouth 3 (three) times daily. 10/07/22   Iva Boop, MD  OVER THE COUNTER MEDICATION Vitamin K- 45mg  daily    [provider]  predniSONE (DELTASONE) 20 MG tablet TAKE 1-1.5 TABLETS (20-30 MG TOTAL) BY MOUTH DAILY WITH BREAKFAST. 09/10/22   Iva Boop, MD  traZODone (DESYREL) 50 MG tablet Take 1 tablet (50 mg total) by mouth at bedtime. 06/06/20 05/04/22  Shanna Cisco, NP  triamcinolone (NASACORT ALLERGY 24HR) 55 MCG/ACT AERO nasal inhaler Place 2 sprays into the nose daily.    [provider]  vedolizumab (ENTYVIO) 300 MG  injection Inject 300 mg into the vein every 6 (six) weeks. 08/25/20   Iva Boop, MD      Allergies    Asacol [mesalamine], Azathioprine, Gluten meal, Metoprolol tartrate, Mycophenolate mofetil, Other, Wheat, Amoxicillin, Tape, and Tylenol [acetaminophen]    Review of Systems   Review of Systems  Musculoskeletal:        Left ankle pain    Physical Exam Updated Vital Signs BP (!) 163/91 (BP Location: Right Arm)   Pulse 80   Temp 97.8 F (36.6 C)   Resp 18   Ht 6' 1.5" (1.867 m)   Wt 82.6 kg   SpO2 97%   BMI 23.69 kg/m  Physical Exam  ED Results / Procedures / Treatments   Labs (all labs  ordered are listed, but only abnormal results are displayed) Labs Reviewed - No data to display  EKG None  Radiology No results found.  Procedures Procedures    Medications Ordered in ED Medications - No data to display  ED Course/ Medical Decision Making/ A&P                                 Medical Decision Making  This patient presents to the ED for concern of left ankle pain, this involves an extensive number of treatment options, and is a complaint that carries with it a high risk of complications and morbidity.  The differential diagnosis includes DVT, Achilles rupture, Achilles tendinitis, fracture, dislocation, cellulitis, laceration   Co morbidities that complicate the patient evaluation  Crohn's disease Chronic steroid use Osteopenia    Additional history obtained:  Additional history obtained from chart review   Lab Tests:  None   Imaging Studies ordered:  None. Patient offered left ankle imaging, patient declined   Cardiac Monitoring: / EKG:  Hemodynamically stable, slightly hypertensive   Consultations Obtained:  None   Problem List / ED Course / Critical interventions / Medication management  Patient reports reporting to emergency room today after a pop in his left Achilles tendon he was concerned about Achilles tendon rupture.  He has had bilateral Achilles tendon like symptoms for the past week which she has been managing on his own with ankle brace.  He reports ankle braces helped tremendously.  He is not reporting any pain or weakness.  He also does not have any acute deformity on physical exam, no ecchymosis or edema.  He slipped and did not fall thus not concerned for a fracture at this time.  Offered patient pain medication, patient declined due to history of Crohn's disease and currently not endorsing a lot of pain Reevaluation of the patient after these medicines showed that the patient stayed the same I have reviewed the patients  home medicines and have made adjustments as needed   Plan Follow-up with orthopedics outpatient. Use at home pain medication as needed ice, heat calf muscle, stretches. Return to the emergency room with new or worsening symptoms.        Final Clinical Impression(s) / ED Diagnoses Final diagnoses:  None    Rx / DC Orders ED Discharge Orders     None         Smitty Knudsen, PA-C 11/03/22 1843    Franne Forts, DO 11/09/22 0010

## 2022-11-03 NOTE — Telephone Encounter (Signed)
Prescription was called into pharmacy yesterday as instructed. [name] called and  stated that the pt requested that instead of 500 mg be prescribed that he requested 400 mg tablets be prescribed. Tamela Oddi was notified to proceed with the 400 mg.  Routed as FYI.

## 2022-11-03 NOTE — ED Notes (Signed)
Dc instructions reviewed with patient. Patient voiced understanding. Dc with belongings.  °

## 2022-11-03 NOTE — ED Triage Notes (Signed)
Pt arrives to ED with c/o left ankle pain. Pt notes he was walking up a hill pulling his trash can and heard a "popping" noise in his left achilles.

## 2022-11-05 ENCOUNTER — Encounter: Payer: Self-pay | Admitting: Internal Medicine

## 2022-11-05 ENCOUNTER — Other Ambulatory Visit: Payer: Self-pay | Admitting: Internal Medicine

## 2022-11-05 DIAGNOSIS — K5000 Crohn's disease of small intestine without complications: Secondary | ICD-10-CM

## 2022-11-05 DIAGNOSIS — K7460 Unspecified cirrhosis of liver: Secondary | ICD-10-CM

## 2022-11-05 DIAGNOSIS — D500 Iron deficiency anemia secondary to blood loss (chronic): Secondary | ICD-10-CM

## 2022-11-11 ENCOUNTER — Other Ambulatory Visit (INDEPENDENT_AMBULATORY_CARE_PROVIDER_SITE_OTHER): Payer: Medicare Other

## 2022-11-11 DIAGNOSIS — D500 Iron deficiency anemia secondary to blood loss (chronic): Secondary | ICD-10-CM | POA: Diagnosis not present

## 2022-11-11 DIAGNOSIS — K5000 Crohn's disease of small intestine without complications: Secondary | ICD-10-CM

## 2022-11-11 DIAGNOSIS — K7460 Unspecified cirrhosis of liver: Secondary | ICD-10-CM | POA: Diagnosis not present

## 2022-11-11 LAB — COMPREHENSIVE METABOLIC PANEL
ALT: 166 U/L — ABNORMAL HIGH (ref 0–53)
AST: 79 U/L — ABNORMAL HIGH (ref 0–37)
Albumin: 3.2 g/dL — ABNORMAL LOW (ref 3.5–5.2)
Alkaline Phosphatase: 146 U/L — ABNORMAL HIGH (ref 39–117)
BUN: 29 mg/dL — ABNORMAL HIGH (ref 6–23)
CO2: 23 meq/L (ref 19–32)
Calcium: 8.1 mg/dL — ABNORMAL LOW (ref 8.4–10.5)
Chloride: 105 meq/L (ref 96–112)
Creatinine, Ser: 1 mg/dL (ref 0.40–1.50)
GFR: 82.38 mL/min (ref >60.00)
Glucose, Bld: 146 mg/dL — ABNORMAL HIGH (ref 70–99)
Potassium: 3.9 meq/L (ref 3.5–5.1)
Sodium: 138 meq/L (ref 135–145)
Total Bilirubin: 0.7 mg/dL (ref 0.2–1.2)
Total Protein: 5.5 g/dL — ABNORMAL LOW (ref 6.0–8.3)

## 2022-11-11 LAB — CBC
HCT: 49 % (ref 39.0–52.0)
Hemoglobin: 16.1 g/dL (ref 13.0–17.0)
MCHC: 32.8 g/dL (ref 30.0–36.0)
MCV: 95.1 fl (ref 78.0–100.0)
Platelets: 142 10*3/uL — ABNORMAL LOW (ref 150.0–400.0)
RBC: 5.15 Mil/uL (ref 4.22–5.81)
RDW: 15.4 % (ref 11.5–15.5)
WBC: 13.5 10*3/uL — ABNORMAL HIGH (ref 4.0–10.5)

## 2022-11-11 LAB — PROTIME-INR
INR: 1.3 ratio — ABNORMAL HIGH (ref 0.8–1.0)
Prothrombin Time: 13.7 s — ABNORMAL HIGH (ref 9.6–13.1)

## 2022-11-11 LAB — SEDIMENTATION RATE: Sed Rate: 30 mm/h — ABNORMAL HIGH (ref 0–20)

## 2022-11-11 LAB — C-REACTIVE PROTEIN: CRP: 2.4 mg/dL (ref 0.5–20.0)

## 2022-11-11 LAB — FERRITIN: Ferritin: 315.7 ng/mL (ref 22.0–322.0)

## 2022-11-15 DIAGNOSIS — E561 Deficiency of vitamin K: Secondary | ICD-10-CM | POA: Diagnosis not present

## 2022-11-16 DIAGNOSIS — M9903 Segmental and somatic dysfunction of lumbar region: Secondary | ICD-10-CM | POA: Diagnosis not present

## 2022-11-19 ENCOUNTER — Ambulatory Visit (INDEPENDENT_AMBULATORY_CARE_PROVIDER_SITE_OTHER): Payer: Medicare Other | Admitting: Internal Medicine

## 2022-11-19 ENCOUNTER — Encounter: Payer: Self-pay | Admitting: Internal Medicine

## 2022-11-19 VITALS — BP 140/80 | HR 68 | Ht 73.0 in | Wt 181.0 lb

## 2022-11-19 DIAGNOSIS — D500 Iron deficiency anemia secondary to blood loss (chronic): Secondary | ICD-10-CM

## 2022-11-19 DIAGNOSIS — K7460 Unspecified cirrhosis of liver: Secondary | ICD-10-CM | POA: Diagnosis not present

## 2022-11-19 DIAGNOSIS — Z7960 Long term (current) use of unspecified immunomodulators and immunosuppressants: Secondary | ICD-10-CM

## 2022-11-19 DIAGNOSIS — K8301 Primary sclerosing cholangitis: Secondary | ICD-10-CM

## 2022-11-19 DIAGNOSIS — K754 Autoimmune hepatitis: Secondary | ICD-10-CM

## 2022-11-19 DIAGNOSIS — K50813 Crohn's disease of both small and large intestine with fistula: Secondary | ICD-10-CM

## 2022-11-19 DIAGNOSIS — Z7952 Long term (current) use of systemic steroids: Secondary | ICD-10-CM

## 2022-11-19 NOTE — Patient Instructions (Addendum)
_______________________________________________________  If your blood pressure at your visit was 140/90 or greater, please contact your primary care physician to follow up on this.  _______________________________________________________  If you are age 59 or older, your body mass index should be between 23-30. Your Body mass index is 23.88 kg/m. If this is out of the aforementioned range listed, please consider follow up with your Primary Care Provider.  If you are age 34 or younger, your body mass index should be between 19-25. Your Body mass index is 23.88 kg/m. If this is out of the aformentioned range listed, please consider follow up with your Primary Care Provider.   ________________________________________________________  The [facility] providers would like to encourage you to use [facility] to communicate with providers for non-urgent requests or questions.  Due to long hold times on the telephone, sending your provider a message by [facility] may be a faster and more efficient way to get a response.  Please allow 48 business hours for a response.  Please remember that this is for non-urgent requests.  _______________________________________________________  Your provider has requested that you go to the basement level for lab work before leaving today. Press "B" on the elevator. The lab is located at the first door on the left as you exit the elevator.   Follow up in Jan 2025.  Please have labs drawn on 12-13-2022.  It was a pleasure to see you today!  Thank you for trusting me with your gastrointestinal care!

## 2022-11-19 NOTE — Progress Notes (Signed)
Kainan Langfitt Toppins 57 y.o. 12-30-1965 [id number]  Assessment & Plan:   Encounter Diagnoses  Name Primary?   Crohn's disease of both small and large intestine with fistula ([name]) Yes   Cirrhosis of liver without ascites, unspecified hepatic cirrhosis type ([name])    Primary sclerosing cholangitis    Autoimmune hepatitis ([name])    Long term current use of systemic steroids    Long-term use of immunosuppressant medication - Entyvio    Iron deficiency anemia due to chronic blood loss    Continues with multiple chronic problems but seems relatively stable overall.  Transaminases have increased again.  I will recheck them in a month.  Continue current care with Entyvio and prednisone.  He has never been able to wean that and is well aware of the potential risks and side effects.  Plan for follow-up with me in January sooner if needed.  I overlooked that he needs a QuantiFERON test and we will have him come for that.  CC: Daisy Floro, MD Dr. Casimer Lanius  Subjective:    Crohn's disease of small and large intestine with history of fistula and perianal abscess  COLONOSCOPY/PATH DX 2001, REPEAT 2005 PATH NEG 2012: Active Crohn's right colon - ileum ok Intolerant of immunomodulators, CellCept, also intolerant of AZA though questionable. 2014 Crohn's flare and ischemic colitis Summer 2014 off prednisone and back on Humira 2015 colonoscopy ileocolonic inflammation no I think improved 08/30/2016 - Entyvio to start November 2021 vedolizumab trough 9.4 mcg/dL going to monthly vedolizumab 2022 every 6-week vedolizumab March 2023 colonoscopy with pseudopolyps/inflammatory polyps but mucosal biopsies were without active inflammatory bowel disease  Status post colorectal surgery care with seton placement in the past for perirectal fistula and abscess  Posterior anal fissure on and off also  Iron deficiency anemia chronic and recurrent has been treated with parenteral iron at  [facility] where he gets his Entyvio infusions  Autoimmune hepatitis-PSC overlap with cirrhosis (also history of alcohol abuse)  AbnormalLFT's seen 8/ 2001 Marked elevation of transaminases Feb 2007, alkaline phosphatase 157, coags normal Positive anti-smooth muscle antibody at 1-80 February 2007, F. Acton antibody IgG 57 which is elevated, other serologies and celiac profile negative Liver biopsy May 11, 2005: Mild chronic hepatitis inflammation grade 1, no fibrosis identified, 2+ cirrhosis, prednisone begun with apparent response, reduction in transaminases. The patient has always had minimal symptomatology Immune to HAV and HBV Treated with prednisone and azathioprine though azathioprine stopped by patient, trial of Cellcept stopped by patient Liver biopsy #2 August 31, 2006 chronic mildly active hepatitis with focal portal fibrosis, iron staining negative, SPEP normal, ANA negative anti-smooth muscle antibody still positive 01/11/07 MR/MRCP right hepatic lobe ducts irregular and beaded consistent with PSC, minimal perihepatic ascites, o/w negative [facility] evaluation, Dr. Ardine Eng April and May 2009, MRCP suggests [facility] and raises ? of cholangiocarcinoma vs fibrosis right lobe of liver, CA 19-9 87 (NL <40), follow-up at [facility] or here suggested with repeat liver biospy to be considered. MR earlier 2010 without signs of cholabgiocarcinoma. Transaminase elevations have been fairly asymptomatic and reduction in levels has not always correlated with prednisone use. MR MRCP 01/08/2011 shows progressive changes of inflammation in the right hepatic lobe and ducts without mass lesion 12/2011 MRCP 1. Slowly progressive hepatic fibrosis secondary to underlying  sclerosing cholangitis. No mass lesion is identified to suggest  cholangiocarcinoma.  2. Stable intrahepatic biliary dilatation and beading of the  common bile duct. Possible tiny gallstones. No evidence of  choledocholithiasis. 12/2012  liver bx CHRONIC HEPATITIS WITH MODERATE ACTIVITY AND BRIDGING FIBROSIS restart prednisone 40 mg daily  July 2021 A. LIVER, LEFT, RANDOM, BIOPSY:  - Features of mild large bile duct obstruction, compatible with history  of primary sclerosing cholangitis. See comment  - Minimal portal fibrosis, Stage 0-1 of 4.    MRI/MRCP with and without contrast 11/02/2021 Unchanged appearance of liver compared to 2021 with severe atrophy in the posterior right lobe segments 6 and 8, beaded ducts consistent with PSC plus segmental intrahepatic biliary ductal dilation no common bile duct dilation and no suspicious liver lesions or enhancement   Portal vein thrombosis treated and resolved with anticoagulation though has left atrophic liver segments as above   History of mesenteric ischemia problems SMA stenosis and briefly went to hospice at this time as he decided to stop all treatment but recovered   Chronic steroid dependence Has not been able to wean steroids ever, is aware of the potential risks.  In general he can only take 20 mg prednisone tablets.  Dose ranges from 20 to 30 mg daily.  Chronic opioid use with chronic pain syndrome Years of hydrocodone 5-325 mg 1 each day.  August 2024 stopped due to components of the pill (titanium and silica dioxide) but the patient perceives cause gastrointestinal disturbance.  Tried a formulation of acetaminophen from compound pharmacy but did not tolerate.  Chronic recurrent depression with a history of avoidant restrictive food intake disorder and anxiety and previous suicide attempt  ----------------------------------------------------------------------------------- Chief Complaint: Follow-up of chronic medical problems as outlined above  HPI [name] presents today reporting that he feels overall pretty well.  Recent events include that he slipped while moving his parents trash can and thought he might have torn his Achilles tendon but was given a diagnosis of  Achilles tendinitis in the ED.  He had 2 episodes of diarrhea with some blood within the past week but over the past several days everything is been normal and no abdominal pain.  He remains on 30 mg of prednisone daily and is getting his Entyvio.  As outlined in the summary above he is off hydrocodone-acetaminophen due to [employer] chemicals that he perceives bother him (silica and titanium dioxide).  He was taking that for multiple joint pains and back pain.  He had stopped his Dupixent which was for his Grovers disease due to some gastrointestinal upset the other month.   Wt Readings from Last 3 Encounters:  11/19/22 181 lb (82.1 kg)  11/03/22 182 lb (82.6 kg)  05/04/22 185 lb 14.4 oz (84.3 kg)   Lab Results  Component Value Date   WBC 13.5 (H) 11/11/2022   HGB 16.1 11/11/2022   HCT 49.0 11/11/2022   MCV 95.1 11/11/2022   PLT 142.0 (L) 11/11/2022     Chemistry      Component Value Date/Time   NA 138 11/11/2022 1340   K 3.9 11/11/2022 1340   CL 105 11/11/2022 1340   CO2 23 11/11/2022 1340   BUN 29 (H) 11/11/2022 1340   CREATININE 1.00 11/11/2022 1340      Component Value Date/Time   CALCIUM 8.1 (L) 11/11/2022 1340   ALKPHOS 146 (H) 11/11/2022 1340   AST 79 (H) 11/11/2022 1340   ALT 166 (H) 11/11/2022 1340   BILITOT 0.7 11/11/2022 1340     Lab Results  Component Value Date   FERRITIN 315.7 11/11/2022   Lab Results  Component Value Date   CRP 2.4 11/11/2022   Lab Results  Component Value  Date   ESRSEDRATE 30 (H) 11/11/2022   Lab Results  Component Value Date   INR 1.3 (H) 11/11/2022   INR 1.2 (H) 08/24/2022   INR 1.1 (H) 03/11/2022     Allergies  Allergen Reactions   Asacol [Mesalamine] Diarrhea    abd pain   Azathioprine Diarrhea    abd pain   Gluten Meal Diarrhea   Metoprolol Tartrate Nausea Only    Stomach upset   Mycophenolate Mofetil Diarrhea    abd pain   Other Other (See Comments)    NUTS; constipation   Wheat Diarrhea    Constipation;  flatulence; abd pain   Amoxicillin Other (See Comments)    Lots of gas   Tape Itching and Rash    Please use "paper" tape   Tylenol [Acetaminophen] Other (See Comments)   Current Meds  Medication Sig   Ascorbic Acid (VITAMIN C) 1000 MG tablet Take 1,000 mg by mouth daily.   Cholecalciferol (VITAMIN D) 125 MCG (5000 UT) CAPS Take 5,000 Units by mouth daily.   clonazePAM (KLONOPIN) 0.5 MG tablet Take 0.5 mg by mouth 2 (two) times daily.   diltiazem 2 % GEL Apply a pea size amount into rectum 2 times a day (Patient taking differently: Apply a pea size amount into rectum prn)   gabapentin (NEURONTIN) 600 MG tablet TAKE 1 TABLET BY MOUTH EVERYDAY AT BEDTIME   ipratropium (ATROVENT) 0.06 % nasal spray Place 2 sprays into both nostrils 3 (three) times daily. Use nasal spray before consuming foods to help with mucus.   Melatonin 3 MG CAPS Take 3 mg by mouth at bedtime as needed.   OVER THE COUNTER MEDICATION Vitamin K- 45mg  daily   predniSONE (DELTASONE) 20 MG tablet TAKE 1-1.5 TABLETS (20-30 MG TOTAL) BY MOUTH DAILY WITH BREAKFAST.   triamcinolone (NASACORT ALLERGY 24HR) 55 MCG/ACT AERO nasal inhaler Place 2 sprays into the nose daily.   vedolizumab (ENTYVIO) 300 MG injection Inject 300 mg into the vein every 6 (six) weeks.   [DISCONTINUED] AMBULATORY NON FORMULARY MEDICATION Medication Name: acetaminophen 500 mg - compounded with microcrystalline cellulose as binder   [DISCONTINUED] ferric carboxymaltose (INJECTAFER) 750 MG/15ML SOLN injection Infuse Day 1 and Day 7   [DISCONTINUED] HYDROcodone-acetaminophen (NORCO/VICODIN) 5-325 MG tablet Take 1 tablet by mouth every 6 (six) hours as needed for moderate pain.   [DISCONTINUED] metroNIDAZOLE (FLAGYL) 500 MG tablet Take 1 tablet (500 mg total) by mouth 3 (three) times daily.   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 06/15/2018   Cancer of skin of neck    Cataract    Chronic mesenteric ischemia ([name])    Chronic pain syndrome 07/09/2016   Crohn's disease of small and large intestines ([name])    followed by dr Baldo Ash Eshaan Titzer   Dairy product intolerance    Diarrhea, functional    Family history of adverse reaction to anesthesia    Grover's disease    transient acantholytic dermatosis   History of alcohol abuse    History of basal cell carcinoma excision    2013 left leg   History of Clostridium difficile    10/ 2014   History of multiple concussions    x6   last  one Jan 2017 per pt--  no residual   History of substance abuse ([name])    quit 1997 per pt   History of suicide attempt    05-18-2012  overdose /  failure to thrive   Iron deficiency anemia due to chronic blood loss    Major depression, recurrent, chronic ([name])    Osteopenia    Personal history of adenomatous colonic polyps 12/2010, 03/2012   12/2010 - 8 mm serrated adenoma of rectum   Portal vein thrombosis 03/21/2015   right   Primary sclerosing cholangitis    ? 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   Substance abuse ([name]) 1997   Alcohol   Vitamin A deficiency 05/23/2018   Vitamin B6 deficiency 07/27/2018   Vitamin C deficiency 05/23/2018   Past Surgical History:  Procedure Laterality Date   ABDOMINAL AORTAGRAM N/A 03/29/2012   Procedure: ABDOMINAL Ronny Flurry;  Surgeon: Nada Libman, MD;  Location: [facility] CATH LAB;  Service: Cardiovascular;  Laterality: N/A;   ADENOIDECTOMY  age 6   BIOPSY  05/31/2018   Procedure: BIOPSY;  Surgeon: Rachael Fee, MD;  Location: [facility] ENDOSCOPY;  Service: Endoscopy;;   CATARACT EXTRACTION W/ INTRAOCULAR LENS  IMPLANT, BILATERAL  2009   COLONOSCOPY  2001, 05/02/2003, 01/28/11   2012: Right colon Crohn's, rectal polyp    COLONOSCOPY  03/31/2012   Procedure: COLONOSCOPY;  Surgeon: Beverley Fiedler, MD;  Location: [facility] ENDOSCOPY;  Service: Gastroenterology;  Laterality: N/A;   COLONOSCOPY WITH PROPOFOL N/A 05/31/2018   Procedure: COLONOSCOPY WITH PROPOFOL;  Surgeon: Rachael Fee, MD;  Location: [facility] ENDOSCOPY;  Service: Endoscopy;  Laterality: N/A;   ESOPHAGOGASTRODUODENOSCOPY  01/28/2011   Normal   FOOT SURGERY Right age 30   MOHS SURGERY Left 11/2013   left ankle parakerotosis    PERCUTANEOUS LIVER BIOPSY  2007 and 2008   PILONIDAL CYST EXCISION  age 43   PLACEMENT OF SETON N/A 11/06/2015   Procedure: PLACEMENT OF SETON;  Surgeon: Romie Levee, MD;  Location: [facility];  Service: General;  Laterality: N/A;   PLACEMENT OF SETON  07/2018   at [facility]   RECTAL EXAM UNDER ANESTHESIA N/A 01/18/2019   Procedure: ANAL EXAM UNDER ANESTHESIA,  INCISION AND DRAINAGE, SETON PLACEMENT;  Surgeon: Romie Levee, MD;  Location: [facility];  Service: General;  Laterality: N/A;   UPPER GASTROINTESTINAL ENDOSCOPY     Social History   Social History Narrative   Single, history of substance abuse in recovery   Previously occupied on medical disability   1 child  Daughter Wellsite geologist, lives and works in [place]   Elderly parents involved and he helps them   1 brother   family history includes Allergies in his father; Breast cancer in his mother; Clotting disorder in his father; Heart disease in his father; Stomach cancer in his mother; Thyroid cancer in his father.   Review of Systems As per HPI  Objective:   Physical Exam @BP  (!) 140/80   Pulse 68   Ht 6\' 1"  (1.854 m)   Wt 181 lb (82.1 kg)   BMI 23.88 kg/m @  General:  NAD Eyes:   anicteric Lungs:  clear Heart::  S1S2 no rubs, murmurs or gallops Abdomen:  soft and nontender, BS+, no HSM/mass Ext:   no edema, cyanosis or clubbing  Skin:  AK's, sun damage, fragile    Data Reviewed:  See  HPI

## 2022-11-23 ENCOUNTER — Encounter: Payer: Self-pay | Admitting: Internal Medicine

## 2022-11-24 ENCOUNTER — Other Ambulatory Visit: Payer: Self-pay | Admitting: Internal Medicine

## 2022-11-24 MED ORDER — HYDROCODONE-ACETAMINOPHEN 5-325 MG PO TABS: 1.0000 | ORAL_TABLET | Freq: Four times a day (QID) | ORAL | 0 refills | Status: DC | PRN

## 2022-11-30 DIAGNOSIS — M9903 Segmental and somatic dysfunction of lumbar region: Secondary | ICD-10-CM | POA: Diagnosis not present

## 2022-12-02 DIAGNOSIS — K5090 Crohn's disease, unspecified, without complications: Secondary | ICD-10-CM | POA: Diagnosis not present

## 2022-12-14 DIAGNOSIS — M9903 Segmental and somatic dysfunction of lumbar region: Secondary | ICD-10-CM | POA: Diagnosis not present

## 2022-12-15 ENCOUNTER — Other Ambulatory Visit (INDEPENDENT_AMBULATORY_CARE_PROVIDER_SITE_OTHER): Payer: Medicare Other

## 2022-12-15 DIAGNOSIS — K7460 Unspecified cirrhosis of liver: Secondary | ICD-10-CM | POA: Diagnosis not present

## 2022-12-15 DIAGNOSIS — K50813 Crohn's disease of both small and large intestine with fistula: Secondary | ICD-10-CM | POA: Diagnosis not present

## 2022-12-15 LAB — HEPATIC FUNCTION PANEL
ALT: 248 U/L — ABNORMAL HIGH (ref 0–53)
AST: 122 U/L — ABNORMAL HIGH (ref 0–37)
Albumin: 3.6 g/dL (ref 3.5–5.2)
Alkaline Phosphatase: 141 U/L — ABNORMAL HIGH (ref 39–117)
Bilirubin, Direct: 0.1 mg/dL (ref 0.0–0.3)
Total Bilirubin: 0.7 mg/dL (ref 0.2–1.2)
Total Protein: 6 g/dL (ref 6.0–8.3)

## 2022-12-17 ENCOUNTER — Encounter: Payer: Self-pay | Admitting: Internal Medicine

## 2022-12-17 ENCOUNTER — Other Ambulatory Visit: Payer: Self-pay | Admitting: Internal Medicine

## 2022-12-17 DIAGNOSIS — K754 Autoimmune hepatitis: Secondary | ICD-10-CM

## 2022-12-24 ENCOUNTER — Other Ambulatory Visit: Payer: Self-pay | Admitting: Internal Medicine

## 2022-12-24 DIAGNOSIS — F1190 Opioid use, unspecified, uncomplicated: Secondary | ICD-10-CM

## 2022-12-24 MED ORDER — HYDROCODONE-ACETAMINOPHEN 5-325 MG PO TABS: 1.0000 | ORAL_TABLET | Freq: Four times a day (QID) | ORAL | 0 refills | Status: DC | PRN

## 2022-12-28 DIAGNOSIS — M9903 Segmental and somatic dysfunction of lumbar region: Secondary | ICD-10-CM | POA: Diagnosis not present

## 2022-12-31 ENCOUNTER — Other Ambulatory Visit (INDEPENDENT_AMBULATORY_CARE_PROVIDER_SITE_OTHER): Payer: Medicare Other

## 2022-12-31 ENCOUNTER — Other Ambulatory Visit: Payer: Self-pay | Admitting: Internal Medicine

## 2022-12-31 DIAGNOSIS — K754 Autoimmune hepatitis: Secondary | ICD-10-CM

## 2022-12-31 DIAGNOSIS — F1190 Opioid use, unspecified, uncomplicated: Secondary | ICD-10-CM

## 2022-12-31 LAB — HEPATIC FUNCTION PANEL
ALT: 38 U/L (ref 0–53)
AST: 27 U/L (ref 0–37)
Albumin: 3.4 g/dL — ABNORMAL LOW (ref 3.5–5.2)
Alkaline Phosphatase: 81 U/L (ref 39–117)
Bilirubin, Direct: 0.2 mg/dL (ref 0.0–0.3)
Total Bilirubin: 0.9 mg/dL (ref 0.2–1.2)
Total Protein: 5.6 g/dL — ABNORMAL LOW (ref 6.0–8.3)

## 2022-12-31 MED ORDER — METRONIDAZOLE 500 MG PO TABS: 500.0000 mg | ORAL_TABLET | Freq: Three times a day (TID) | ORAL | 0 refills | Status: AC

## 2023-01-01 LAB — DRUG MONITORING, PANEL 8 WITH CONFIRMATION, URINE
6 Acetylmorphine: NEGATIVE ng/mL (ref <10)
Alcohol Metabolites: NEGATIVE ng/mL (ref <500)
Amphetamines: NEGATIVE ng/mL (ref <500)
Benzodiazepines: NEGATIVE ng/mL (ref <100)
Buprenorphine, Urine: NEGATIVE ng/mL (ref <5)
Cocaine Metabolite: NEGATIVE ng/mL (ref <150)
Creatinine: 124 mg/dL (ref >=20.0)
MDMA: NEGATIVE ng/mL (ref <500)
Marijuana Metabolite: NEGATIVE ng/mL (ref <20)
Opiates: NEGATIVE ng/mL (ref <100)
Oxidant: NEGATIVE ug/mL (ref <200)
Oxycodone: NEGATIVE ng/mL (ref <100)
pH: 5.7 (ref 4.5–9.0)

## 2023-01-01 LAB — DM TEMPLATE

## 2023-01-05 ENCOUNTER — Encounter: Payer: Self-pay | Admitting: Internal Medicine

## 2023-01-10 ENCOUNTER — Telehealth: Payer: Self-pay | Admitting: Internal Medicine

## 2023-01-10 DIAGNOSIS — K50813 Crohn's disease of both small and large intestine with fistula: Secondary | ICD-10-CM

## 2023-01-10 NOTE — Telephone Encounter (Signed)
Says he is improving w/ less gas  Stools still dark but not diarrhea  Has Entyvio infusion 0n 11/14 so we need to cancekl 130 appt 11/14 w/ me -   I have ordered some labs for him to do 11/13  Will coordinate f/u me after that

## 2023-01-10 NOTE — Telephone Encounter (Signed)
Pt notified, appt cancelled

## 2023-01-12 ENCOUNTER — Other Ambulatory Visit (INDEPENDENT_AMBULATORY_CARE_PROVIDER_SITE_OTHER): Payer: Medicare Other

## 2023-01-12 DIAGNOSIS — K50813 Crohn's disease of both small and large intestine with fistula: Secondary | ICD-10-CM

## 2023-01-12 LAB — COMPREHENSIVE METABOLIC PANEL
ALT: 32 U/L (ref 0–53)
AST: 29 U/L (ref 0–37)
Albumin: 3.5 g/dL (ref 3.5–5.2)
Alkaline Phosphatase: 86 U/L (ref 39–117)
BUN: 22 mg/dL (ref 6–23)
CO2: 27 meq/L (ref 19–32)
Calcium: 8.8 mg/dL (ref 8.4–10.5)
Chloride: 95 meq/L — ABNORMAL LOW (ref 96–112)
Creatinine, Ser: 1.17 mg/dL (ref 0.40–1.50)
GFR: 68.15 mL/min (ref >60.00)
Glucose, Bld: 90 mg/dL (ref 70–99)
Potassium: 4.7 meq/L (ref 3.5–5.1)
Sodium: 136 meq/L (ref 135–145)
Total Bilirubin: 1.6 mg/dL — ABNORMAL HIGH (ref 0.2–1.2)
Total Protein: 5.5 g/dL — ABNORMAL LOW (ref 6.0–8.3)

## 2023-01-12 LAB — CBC
HCT: 49.8 % (ref 39.0–52.0)
Hemoglobin: 17 g/dL (ref 13.0–17.0)
MCHC: 34 g/dL (ref 30.0–36.0)
MCV: 95.2 fL (ref 78.0–100.0)
Platelets: 184 10*3/uL (ref 150.0–400.0)
RBC: 5.23 Mil/uL (ref 4.22–5.81)
RDW: 14.3 % (ref 11.5–15.5)
WBC: 14.5 10*3/uL — ABNORMAL HIGH (ref 4.0–10.5)

## 2023-01-12 LAB — C-REACTIVE PROTEIN: CRP: 1.1 mg/dL (ref 0.5–20.0)

## 2023-01-12 LAB — SEDIMENTATION RATE: Sed Rate: 9 mm/h (ref 0–20)

## 2023-01-13 ENCOUNTER — Encounter: Payer: Self-pay | Admitting: Internal Medicine

## 2023-01-13 ENCOUNTER — Ambulatory Visit: Payer: Medicare Other | Admitting: Internal Medicine

## 2023-01-13 DIAGNOSIS — R748 Abnormal levels of other serum enzymes: Secondary | ICD-10-CM | POA: Diagnosis not present

## 2023-01-13 DIAGNOSIS — M2550 Pain in unspecified joint: Secondary | ICD-10-CM | POA: Diagnosis not present

## 2023-01-13 DIAGNOSIS — D509 Iron deficiency anemia, unspecified: Secondary | ICD-10-CM | POA: Diagnosis not present

## 2023-01-13 DIAGNOSIS — K5090 Crohn's disease, unspecified, without complications: Secondary | ICD-10-CM | POA: Diagnosis not present

## 2023-01-13 DIAGNOSIS — K754 Autoimmune hepatitis: Secondary | ICD-10-CM | POA: Diagnosis not present

## 2023-01-13 DIAGNOSIS — K8301 Primary sclerosing cholangitis: Secondary | ICD-10-CM | POA: Diagnosis not present

## 2023-01-13 DIAGNOSIS — Z79899 Other long term (current) drug therapy: Secondary | ICD-10-CM | POA: Diagnosis not present

## 2023-01-17 ENCOUNTER — Other Ambulatory Visit: Payer: Self-pay | Admitting: Internal Medicine

## 2023-01-17 ENCOUNTER — Encounter: Payer: Self-pay | Admitting: Internal Medicine

## 2023-01-17 DIAGNOSIS — E561 Deficiency of vitamin K: Secondary | ICD-10-CM | POA: Diagnosis not present

## 2023-01-17 NOTE — Telephone Encounter (Signed)
Sir please advise if okay to refill with current sig. Thanks

## 2023-01-18 NOTE — Telephone Encounter (Signed)
Scheduled PV with patient for 02/16/23 at 3:30 pm & colon on 03/07/22 at 9:30 am in the [facility] with Dr. Leone Payor. Pt states he is not on any blood thinners/diabetic.

## 2023-02-16 ENCOUNTER — Telehealth: Payer: Self-pay | Admitting: *Deleted

## 2023-02-16 DIAGNOSIS — L565 Disseminated superficial actinic porokeratosis (DSAP): Secondary | ICD-10-CM | POA: Diagnosis not present

## 2023-02-16 DIAGNOSIS — D225 Melanocytic nevi of trunk: Secondary | ICD-10-CM | POA: Diagnosis not present

## 2023-02-16 DIAGNOSIS — D0462 Carcinoma in situ of skin of left upper limb, including shoulder: Secondary | ICD-10-CM | POA: Diagnosis not present

## 2023-02-16 DIAGNOSIS — L570 Actinic keratosis: Secondary | ICD-10-CM | POA: Diagnosis not present

## 2023-02-16 DIAGNOSIS — Z85828 Personal history of other malignant neoplasm of skin: Secondary | ICD-10-CM | POA: Diagnosis not present

## 2023-02-16 DIAGNOSIS — L821 Other seborrheic keratosis: Secondary | ICD-10-CM | POA: Diagnosis not present

## 2023-02-16 NOTE — Telephone Encounter (Signed)
Attempt to reach pt for pre-visit. LM with call back #.   Will attempt other number in profile Reached pt at second attempt with home #

## 2023-02-24 DIAGNOSIS — K5090 Crohn's disease, unspecified, without complications: Secondary | ICD-10-CM | POA: Diagnosis not present

## 2023-03-08 ENCOUNTER — Encounter: Payer: Medicare Other | Admitting: Internal Medicine

## 2023-03-23 ENCOUNTER — Ambulatory Visit: Payer: Medicare Other | Admitting: Internal Medicine

## 2023-03-23 ENCOUNTER — Encounter: Payer: Self-pay | Admitting: Internal Medicine

## 2023-03-28 ENCOUNTER — Inpatient Hospital Stay (HOSPITAL_COMMUNITY)
Admission: AD | Admit: 2023-03-28 | Discharge: 2023-04-04 | DRG: 885 | Disposition: A | Discharge status: Other Acute Inpatient Hospital | Payer: Medicare Other | Source: Intra-hospital | Attending: Psychiatry | Admitting: Psychiatry

## 2023-03-28 ENCOUNTER — Encounter (HOSPITAL_COMMUNITY): Payer: Self-pay | Admitting: Psychiatry

## 2023-03-28 ENCOUNTER — Encounter (HOSPITAL_BASED_OUTPATIENT_CLINIC_OR_DEPARTMENT_OTHER): Payer: Self-pay

## 2023-03-28 ENCOUNTER — Other Ambulatory Visit: Payer: Self-pay

## 2023-03-28 ENCOUNTER — Emergency Department (EMERGENCY_DEPARTMENT_HOSPITAL)
Admission: EM | Admit: 2023-03-28 | Discharge: 2023-03-28 | Disposition: A | Discharge status: Other Acute Inpatient Hospital | Payer: Medicare Other | Source: Home / Self Care | Attending: Emergency Medicine | Admitting: Emergency Medicine

## 2023-03-28 DIAGNOSIS — F411 Generalized anxiety disorder: Secondary | ICD-10-CM | POA: Diagnosis not present

## 2023-03-28 DIAGNOSIS — K5080 Crohn's disease of both small and large intestine without complications: Secondary | ICD-10-CM | POA: Diagnosis present

## 2023-03-28 DIAGNOSIS — K754 Autoimmune hepatitis: Secondary | ICD-10-CM | POA: Diagnosis present

## 2023-03-28 DIAGNOSIS — Z832 Family history of diseases of the blood and blood-forming organs and certain disorders involving the immune mechanism: Secondary | ICD-10-CM

## 2023-03-28 DIAGNOSIS — Z85828 Personal history of other malignant neoplasm of skin: Secondary | ICD-10-CM

## 2023-03-28 DIAGNOSIS — K5090 Crohn's disease, unspecified, without complications: Secondary | ICD-10-CM | POA: Diagnosis present

## 2023-03-28 DIAGNOSIS — Z880 Allergy status to penicillin: Secondary | ICD-10-CM

## 2023-03-28 DIAGNOSIS — I10 Essential (primary) hypertension: Secondary | ICD-10-CM | POA: Diagnosis present

## 2023-03-28 DIAGNOSIS — F332 Major depressive disorder, recurrent severe without psychotic features: Secondary | ICD-10-CM | POA: Diagnosis not present

## 2023-03-28 DIAGNOSIS — Z8249 Family history of ischemic heart disease and other diseases of the circulatory system: Secondary | ICD-10-CM | POA: Diagnosis not present

## 2023-03-28 DIAGNOSIS — Z888 Allergy status to other drugs, medicaments and biological substances status: Secondary | ICD-10-CM | POA: Diagnosis not present

## 2023-03-28 DIAGNOSIS — Z803 Family history of malignant neoplasm of breast: Secondary | ICD-10-CM

## 2023-03-28 DIAGNOSIS — Z87891 Personal history of nicotine dependence: Secondary | ICD-10-CM | POA: Insufficient documentation

## 2023-03-28 DIAGNOSIS — E871 Hypo-osmolality and hyponatremia: Secondary | ICD-10-CM | POA: Diagnosis not present

## 2023-03-28 DIAGNOSIS — Z79899 Other long term (current) drug therapy: Secondary | ICD-10-CM

## 2023-03-28 DIAGNOSIS — E860 Dehydration: Secondary | ICD-10-CM | POA: Diagnosis not present

## 2023-03-28 DIAGNOSIS — Z9151 Personal history of suicidal behavior: Secondary | ICD-10-CM

## 2023-03-28 DIAGNOSIS — F329 Major depressive disorder, single episode, unspecified: Secondary | ICD-10-CM | POA: Insufficient documentation

## 2023-03-28 DIAGNOSIS — G894 Chronic pain syndrome: Secondary | ICD-10-CM | POA: Diagnosis not present

## 2023-03-28 DIAGNOSIS — Z5321 Procedure and treatment not carried out due to patient leaving prior to being seen by health care provider: Secondary | ICD-10-CM | POA: Diagnosis not present

## 2023-03-28 DIAGNOSIS — F79 Unspecified intellectual disabilities: Secondary | ICD-10-CM | POA: Insufficient documentation

## 2023-03-28 DIAGNOSIS — Z808 Family history of malignant neoplasm of other organs or systems: Secondary | ICD-10-CM | POA: Diagnosis not present

## 2023-03-28 DIAGNOSIS — G4700 Insomnia, unspecified: Secondary | ICD-10-CM | POA: Diagnosis present

## 2023-03-28 DIAGNOSIS — Z800 Family history of malignant neoplasm of digestive organs: Secondary | ICD-10-CM

## 2023-03-28 DIAGNOSIS — F4310 Post-traumatic stress disorder, unspecified: Secondary | ICD-10-CM | POA: Diagnosis present

## 2023-03-28 DIAGNOSIS — R531 Weakness: Secondary | ICD-10-CM | POA: Diagnosis not present

## 2023-03-28 DIAGNOSIS — R600 Localized edema: Secondary | ICD-10-CM | POA: Insufficient documentation

## 2023-03-28 DIAGNOSIS — Z860101 Personal history of adenomatous and serrated colon polyps: Secondary | ICD-10-CM

## 2023-03-28 DIAGNOSIS — R45851 Suicidal ideations: Secondary | ICD-10-CM | POA: Diagnosis present

## 2023-03-28 DIAGNOSIS — Z818 Family history of other mental and behavioral disorders: Secondary | ICD-10-CM

## 2023-03-28 DIAGNOSIS — E54 Ascorbic acid deficiency: Secondary | ICD-10-CM | POA: Diagnosis not present

## 2023-03-28 DIAGNOSIS — F419 Anxiety disorder, unspecified: Secondary | ICD-10-CM

## 2023-03-28 DIAGNOSIS — F410 Panic disorder [episodic paroxysmal anxiety] without agoraphobia: Secondary | ICD-10-CM | POA: Diagnosis present

## 2023-03-28 DIAGNOSIS — Z886 Allergy status to analgesic agent status: Secondary | ICD-10-CM | POA: Diagnosis not present

## 2023-03-28 DIAGNOSIS — R197 Diarrhea, unspecified: Secondary | ICD-10-CM | POA: Diagnosis not present

## 2023-03-28 DIAGNOSIS — F32A Depression, unspecified: Principal | ICD-10-CM | POA: Diagnosis present

## 2023-03-28 DIAGNOSIS — Z87820 Personal history of traumatic brain injury: Secondary | ICD-10-CM

## 2023-03-28 LAB — COMPREHENSIVE METABOLIC PANEL
ALT: 212 U/L — ABNORMAL HIGH (ref 0–44)
AST: 99 U/L — ABNORMAL HIGH (ref 15–41)
Albumin: 3.3 g/dL — ABNORMAL LOW (ref 3.5–5.0)
Alkaline Phosphatase: 97 U/L (ref 38–126)
Anion gap: 6 (ref 5–15)
BUN: 16 mg/dL (ref 6–20)
CO2: 30 mmol/L (ref 22–32)
Calcium: 8.1 mg/dL — ABNORMAL LOW (ref 8.9–10.3)
Chloride: 106 mmol/L (ref 98–111)
Creatinine, Ser: 0.78 mg/dL (ref 0.61–1.24)
GFR, Estimated: >60 mL/min (ref >60)
Glucose, Bld: 141 mg/dL — ABNORMAL HIGH (ref 70–99)
Potassium: 4.1 mmol/L (ref 3.5–5.1)
Sodium: 142 mmol/L (ref 135–145)
Total Bilirubin: 0.5 mg/dL (ref 0.0–1.2)
Total Protein: 5.1 g/dL — ABNORMAL LOW (ref 6.5–8.1)

## 2023-03-28 LAB — RAPID URINE DRUG SCREEN, HOSP PERFORMED
Amphetamines: NOT DETECTED
Barbiturates: NOT DETECTED
Benzodiazepines: NOT DETECTED
Cocaine: NOT DETECTED
Opiates: POSITIVE — AB
Tetrahydrocannabinol: NOT DETECTED

## 2023-03-28 LAB — CBC WITH DIFFERENTIAL/PLATELET
Abs Immature Granulocytes: 0.66 10*3/uL — ABNORMAL HIGH (ref 0.00–0.07)
Basophils Absolute: 0 10*3/uL (ref 0.0–0.1)
Basophils Relative: 0 %
Eosinophils Absolute: 0 10*3/uL (ref 0.0–0.5)
Eosinophils Relative: 0 %
HCT: 46.4 % (ref 39.0–52.0)
Hemoglobin: 15.3 g/dL (ref 13.0–17.0)
Immature Granulocytes: 7 %
Lymphocytes Relative: 3 %
Lymphs Abs: 0.3 10*3/uL — ABNORMAL LOW (ref 0.7–4.0)
MCH: 32.1 pg (ref 26.0–34.0)
MCHC: 33 g/dL (ref 30.0–36.0)
MCV: 97.5 fL (ref 80.0–100.0)
Monocytes Absolute: 0.4 10*3/uL (ref 0.1–1.0)
Monocytes Relative: 4 %
Neutro Abs: 8.5 10*3/uL — ABNORMAL HIGH (ref 1.7–7.7)
Neutrophils Relative %: 86 %
Platelets: 139 10*3/uL — ABNORMAL LOW (ref 150–400)
RBC: 4.76 MIL/uL (ref 4.22–5.81)
RDW: 15.2 % (ref 11.5–15.5)
WBC: 9.9 10*3/uL (ref 4.0–10.5)
nRBC: 0 % (ref 0.0–0.2)

## 2023-03-28 LAB — SALICYLATE LEVEL: Salicylate Lvl: 0 mg/dL — ABNORMAL LOW (ref 7.0–30.0)

## 2023-03-28 LAB — ACETAMINOPHEN LEVEL: Acetaminophen (Tylenol), Serum: <10 ug/mL — ABNORMAL LOW (ref 10–30)

## 2023-03-28 LAB — ETHANOL: Alcohol, Ethyl (B): <10 mg/dL (ref <10)

## 2023-03-28 MED ORDER — ALUM & MAG HYDROXIDE-SIMETH 200-200-20 MG/5ML PO SUSP
30.0000 mL | ORAL | Status: DC | PRN
  Administered 2023-04-01 – 2023-04-02 (×3): 30 mL via ORAL
  Filled 2023-03-28 (×3): qty 30

## 2023-03-28 MED ORDER — DIPHENHYDRAMINE HCL 50 MG/ML IJ SOLN: 50.0000 mg | Freq: Three times a day (TID) | INTRAMUSCULAR | Status: DC | PRN

## 2023-03-28 MED ORDER — LORAZEPAM 2 MG/ML IJ SOLN: 2.0000 mg | Freq: Three times a day (TID) | INTRAMUSCULAR | Status: DC | PRN

## 2023-03-28 MED ORDER — HALOPERIDOL LACTATE 5 MG/ML IJ SOLN: 10.0000 mg | Freq: Three times a day (TID) | INTRAMUSCULAR | Status: DC | PRN

## 2023-03-28 MED ORDER — DIPHENHYDRAMINE HCL 25 MG PO CAPS
50.0000 mg | ORAL_CAPSULE | Freq: Three times a day (TID) | ORAL | Status: DC | PRN
  Administered 2023-03-31 – 2023-04-01 (×2): 50 mg via ORAL
  Filled 2023-03-28 (×2): qty 2

## 2023-03-28 MED ORDER — HALOPERIDOL 5 MG PO TABS
5.0000 mg | ORAL_TABLET | Freq: Three times a day (TID) | ORAL | Status: DC | PRN
  Administered 2023-03-31 – 2023-04-04 (×3): 5 mg via ORAL
  Filled 2023-03-28 (×3): qty 1

## 2023-03-28 MED ORDER — MAGNESIUM HYDROXIDE 400 MG/5ML PO SUSP: 30.0000 mL | Freq: Every day | ORAL | Status: DC | PRN

## 2023-03-28 MED ORDER — HALOPERIDOL LACTATE 5 MG/ML IJ SOLN: 5.0000 mg | Freq: Three times a day (TID) | INTRAMUSCULAR | Status: DC | PRN

## 2023-03-28 NOTE — Progress Notes (Signed)
Patient admitted from [facility] ED for increased anxiety. Per patient, he had been taking klonopin for several years to help him sleep. The klonopin that he had been taking was specially coated to not irritate his stomach, as he suffers from Crohn disease. This brand has recently ceased manufacturing and patient was tapered off klonopin. He reportedly tried xanax, and lorazepam with minimal effect. He lives with/cares for his elderly parents, and his father was recently diagnosed with dementia. Without his medication he has been increasingly anxious, and was afraid that he may hurt himself so he came to the ED for tx. Patient has been on disability for 14 years d/t multiple medical conditions. He currently denies suicidal ideation, but has had several attempt in the past. The last attempt was in 2010, when patient attempted to cut his throat. He named his brother as additional support for himself and parents. He denied AVH, paranoia. Patient appeared disheveled, and weak., with minimal eye contact. He reports only eating ham, bread and bottled water d/t condition. He also reports frequent bouts of loose stool and hx of rectal surgeries. Patient was unable to name immediate stressors other than wanting medication for sleep and anxiety. Edema noted bilateral feet, and dry skin throughout. Currently denies pain. Admission information discussed with patient, understanding verbalized. Patient oriented to the unit, shown to his room.

## 2023-03-28 NOTE — ED Provider Notes (Cosign Needed Addendum)
[place] EMERGENCY DEPARTMENT AT [facility] Provider Note   CSN: [id number] Arrival date & time: 03/28/23  7829     History  Chief Complaint  Patient presents with   Anxiety    [name] is a 58 y.o. male with a past medical history of HLD, HTN, Crohn's (on prednisone 30mg  every day), MDD, chronic pain syndrome, PTSD, IDA presents to emergency department voluntarily with his brother for evaluation of anxiety and concern for harming himself. He reports that he takes Klonopin 0.5 mg 2-3 times daily and only has 10 more pills.  His father was recently diagnosed with dementia and his mother is not medically well so he has been staying with him and taking care of them.  He states "I do not want to go home and be alone as something might happen". He reports concern about harming himself if left alone. He endorses SI over past week with worsening anxiety. He had 1 Klonopin this morning for anxiety.  Currently, in ED he has no SI nor plan for suicide. He denies CP, SOB, taking extra medication today.  On 03/23/23, he messaged his GI doctor for [facility] referral d/t increased anxiety. He was recommended to f/u with PCP   Anxiety Pertinent negatives include no chest pain, no abdominal pain, no headaches and no shortness of breath.     Home Medications Prior to Admission medications   Medication Sig Start Date End Date Taking? Authorizing Provider  Ascorbic Acid (VITAMIN C) 1000 MG tablet Take 1,000 mg by mouth daily.    [provider]  Cholecalciferol (VITAMIN D) 125 MCG (5000 UT) CAPS Take 5,000 Units by mouth daily.    [provider]  clonazePAM (KLONOPIN) 0.5 MG tablet Take 0.5 mg by mouth 2 (two) times daily. 09/20/22   [provider]  diltiazem 2 % GEL Apply a pea size amount into rectum 2 times a day Patient taking differently: Apply a pea size amount into rectum prn 09/24/20   Iva Boop, MD  DUPIXENT 300 MG/2ML prefilled syringe Inject 300  mg into the skin every 14 (fourteen) days. Patient not taking: Reported on 11/19/2022 09/09/22   [provider]  gabapentin (NEURONTIN) 600 MG tablet TAKE 1 TABLET BY MOUTH EVERYDAY AT BEDTIME 07/22/22   Iva Boop, MD  HYDROcodone-acetaminophen (NORCO/VICODIN) 5-325 MG tablet Take 1 tablet by mouth every 6 (six) hours as needed for severe pain (pain score 7-10). 12/24/22   Iva Boop, MD  ipratropium (ATROVENT) 0.06 % nasal spray Place 2 sprays into both nostrils 3 (three) times daily. Use nasal spray before consuming foods to help with mucus. 05/04/22   Kozlow, Alvira Philips, MD  Melatonin 3 MG CAPS Take 3 mg by mouth at bedtime as needed.    [provider]  OVER THE COUNTER MEDICATION Vitamin K- 45mg  daily    [provider]  predniSONE (DELTASONE) 20 MG tablet Take 1.5 tablets (30 mg total) by mouth daily with breakfast. 01/17/23   Iva Boop, MD  traZODone (DESYREL) 50 MG tablet Take 1 tablet (50 mg total) by mouth at bedtime. 06/06/20 05/04/22  Shanna Cisco, NP  triamcinolone (NASACORT ALLERGY 24HR) 55 MCG/ACT AERO nasal inhaler Place 2 sprays into the nose daily.    [provider]  vedolizumab (ENTYVIO) 300 MG injection Inject 300 mg into the vein every 6 (six) weeks. 08/25/20   Iva Boop, MD      Allergies    Asacol [mesalamine],  Azathioprine, Gluten meal, Metoprolol tartrate, Mycophenolate mofetil, Other, Wheat, Amoxicillin, Tape, and Tylenol [acetaminophen]    Review of Systems   Review of Systems  Constitutional:  Negative for chills, fatigue and fever.  Respiratory:  Negative for cough, chest tightness, shortness of breath and wheezing.   Cardiovascular:  Negative for chest pain and palpitations.  Gastrointestinal:  Negative for abdominal pain, constipation, diarrhea, nausea and vomiting.  Neurological:  Negative for dizziness, seizures, weakness, light-headedness, numbness and headaches.  Psychiatric/Behavioral:  Positive for  suicidal ideas. Negative for self-injury.     Physical Exam Updated Vital Signs BP (!) 161/76 (BP Location: Right Arm)   Pulse (!) 109   Temp 98.8 F (37.1 C)   Resp 16   Ht 6\' 1"  (1.854 m)   Wt 82.1 kg   SpO2 100%   BMI 23.88 kg/m  Physical Exam Vitals and nursing note reviewed.  Constitutional:      General: He is not in acute distress.    Appearance: Normal appearance.  HENT:     Head: Normocephalic and atraumatic.  Eyes:     Conjunctiva/sclera: Conjunctivae normal.  Cardiovascular:     Rate and Rhythm: Normal rate.  Pulmonary:     Effort: Pulmonary effort is normal. No tachypnea, bradypnea, accessory muscle usage or respiratory distress.     Breath sounds: Normal breath sounds and air entry.  Musculoskeletal:     Right lower leg: 1+ Edema (Has this at baseline d/t chronic steroids. No worsening) present.     Left lower leg: 1+ Edema (Has this at baseline d/t chronic steroids. No worsening) present.     Comments: No calf TTP BLE  Skin:    Capillary Refill: Capillary refill takes less than 2 seconds.     Coloration: Skin is not jaundiced or pale.  Neurological:     Mental Status: He is alert and oriented to person, place, and time. Mental status is at baseline.     Motor: No weakness.     Gait: Gait normal.  Psychiatric:        Attention and Perception: He does not perceive auditory or visual hallucinations.        Mood and Affect: Mood is depressed. Affect is flat.        Speech: Speech normal.        Behavior: Behavior normal.        Thought Content: Thought content is not paranoid. Thought content includes suicidal ideation. Thought content does not include homicidal ideation. Thought content does not include homicidal or suicidal plan.     ED Results / Procedures / Treatments   Labs (all labs ordered are listed, but only abnormal results are displayed) Labs Reviewed  COMPREHENSIVE METABOLIC PANEL - Abnormal; Notable for the following components:      Result  Value   Glucose, Bld 141 (*)    Calcium 8.1 (*)    Total Protein 5.1 (*)    Albumin 3.3 (*)    AST 99 (*)    ALT 212 (*)    All other components within normal limits  CBC WITH DIFFERENTIAL/PLATELET - Abnormal; Notable for the following components:   Platelets 139 (*)    Neutro Abs 8.5 (*)    Lymphs Abs 0.3 (*)    Abs Immature Granulocytes 0.66 (*)    All other components within normal limits  SALICYLATE LEVEL - Abnormal; Notable for the following components:   Salicylate Lvl 0.0 (*)    All other components within normal limits  ETHANOL  RAPID URINE DRUG SCREEN, HOSP PERFORMED  ACETAMINOPHEN LEVEL    EKG None  Radiology No results found.  Procedures Procedures    Medications Ordered in ED Medications - No data to display  ED Course/ Medical Decision Making/ A&P                                 Medical Decision Making Amount and/or Complexity of Data Reviewed Labs: ordered.   Patient presents to the ED for concern of SI, anxiety, this involves an extensive number of treatment options, and is a complaint that carries with it a high risk of complications and morbidity.  The differential diagnosis includes medical causes considered but less likely include ACS, DVT/PE, electrolyte abnormality, drug use   Co morbidities that complicate the patient evaluation  PMHx of SI attempts, increased stress with family medical concerns, anxiety   Additional history obtained:  Additional history obtained from [facility], Nursing, and Outside Medical Records   External records from outside source obtained and reviewed including  Triage RN note Brother at bedside Recent medical evaluations that are not pertinent to evaluation today Request for [facility] consult on 03/23/23 Medication list   Lab Tests:  I Ordered, and personally interpreted labs.  The pertinent results include:   No leukocytosis, gross electrolyte abnormalities from baseline Ethanol negative UDS pending   Cardiac  Monitoring:  The patient was maintained on a cardiac monitor.  I personally viewed and interpreted the cardiac monitored which showed an underlying rhythm of:  Sinus tachycardia at 109.  No STE nor ischemia noted    Consultations Obtained:  I requested consultation with TTS - pending   Problem List / ED Course:  Anxiety SI Considered DVT, PE, ACS as cause of anxiety.  However, with reassuring physical exam and no complaints of chest pain, shortness of breath, calf tenderness, I do not believe that patient has DVT, PE, ACS going on. No electrolyte abnormality to explain anxiety As patient has past medical history of SI and increased stress due to medical complaints of family, I believe that this is strictly behavioral. Medically clear and will have TTS consult d/t patient reporting concern about sel injury and SI if left alone    Reevaluation:  After the interventions noted above, I reevaluated the patient and found that they have :stayed the same   Social Determinants of Health:  Has PCP, GI f/u   Dispostion:  After consideration of the diagnostic results and the patients response to treatment, I feel that the patent would benefit from TTS consult.   Dr. Denton Lank individually assessed pt, reviewed ED workup and agree with plan. Final Clinical Impression(s) / ED Diagnoses Final diagnoses:  Anxiety  Suicidal ideation    Rx / DC Orders ED Discharge Orders     None         Judithann Sheen, PA 03/28/23 1135    Judithann Sheen, PA 03/28/23 Izola Price    Cathren Laine, MD 03/29/23 1005

## 2023-03-28 NOTE — ED Notes (Signed)
Patient departed ED via Safe Transport to [facility]

## 2023-03-28 NOTE — ED Triage Notes (Signed)
States having anxiety.  States has had for years.  States been on medication which he weaned off.  Anxiety in last week has come back and started back on medication.  States afraid to be by himself.  Afraid he may kill himself.  States had attempts in the passed.  Expresses does not want to kill self.

## 2023-03-28 NOTE — ED Notes (Signed)
Spoke with Licensed conveyancer at [phone] confirmed placement for pt. Pt to go to [facility] on [address]. Pt voluntary. Consent obtained from primary RN Victorino Dike. ED secretary setting up safe transport for pt.

## 2023-03-28 NOTE — Tx Team (Signed)
Initial Treatment Plan 03/28/2023 11:21 PM [name] [id number]    PATIENT STRESSORS: Medication change or noncompliance     PATIENT STRENGTHS: Supportive family/friends    PATIENT IDENTIFIED PROBLEMS: Changes to medication                      DISCHARGE CRITERIA:  Ability to meet basic life and health needs  PRELIMINARY DISCHARGE PLAN: Return to previous living arrangement  PATIENT/FAMILY INVOLVEMENT: This treatment plan has been presented to and reviewed with the patient, [name], and/or family member.  The patient and family have been given the opportunity to ask questions and make suggestions.  Mathews Argyle, RN 03/28/2023, 11:21 PM

## 2023-03-28 NOTE — Consult Note (Signed)
[facility] Consult Note  Patient Name: [name] MRN: [id number] DOB: Jul 13, 1965 DATE OF Consult: 03/28/2023  PRIMARY PSYCHIATRIC DIAGNOSES  1.  Major depressive disorder 2.  GAD 3.  PTD  RECOMMENDATIONS  Recommendations: Medication recommendations: Klonopin 0.5 mg bid with 0.5 mg prn once a day Patient states that the manufacturer quit producing his Klonopin and all the other ones they tried irritated his Chron's.  Non-Medication/therapeutic recommendations: Can't stop the benzo or will have withdrawals. Can replace with other benzo's if tolerated.  Psychiatric medications will have to be addressed inpatient. He states long history of medication intolerances. Is inpatient psychiatric hospitalization recommended for this patient? Yes (Explain why): patient is suicidal, history of attempts and afraid to be alone Follow-Up Telepsychiatry C/L services: We will continue to follow this patient with you until stabilized or discharged.  If you have any questions or concerns, please call our TeleCare Coordination service at  [phone] and ask for myself or the provider on-call. Communication: Treatment team members (and family members if applicable) who were involved in treatment/care discussions and planning, and with whom we spoke or engaged with via secure text/chat, include the following: treatment team via Epic Chat  Thank you for involving [place] in the care of this patient. If you have any additional questions or concerns, please call [phone] and ask for me or the provider on-call.  TELEPSYCHIATRY ATTESTATION & CONSENT  As the provider for this telehealth consult, I attest that I verified the patient's identity using two separate identifiers, introduced myself to the patient, provided my credentials, disclosed my location, and performed this encounter via a HIPAA-compliant, real-time, face-to-face, two-way, interactive audio and video platform and with the full consent and  agreement of the patient (or guardian as applicable.)  Patient physical location: [facility]. Telehealth provider physical location: home office in state of [place].  Video start time: 1700 (Central Time) Video end time: 1710 (Central Time)  IDENTIFYING DATA  [name] is a 58 y.o. year-old male for whom a psychiatric consultation has been ordered by the primary provider. The patient was identified using two separate identifiers.  CHIEF COMPLAINT/REASON FOR CONSULT  Increasing anxiety with suicidal thoughts  HISTORY OF PRESENT ILLNESS (HPI)  The patient has a complicated medical history that sounds like it is made it hard to treat his mental health.  He has Crohn's disease and multiple other autoimmune disorders.  He is very sensitive and cannot tolerate many medications.  His Klonopin manufacturer apparently quit making his medication so they tried multiple other manufacturers and he was unable to tolerate any other Klonopin at this point.  Unclear if she tried other benzodiazepines.  It appears that over the years he's tried multiple psychiatric medications and has not been able to tolerate much. The patient has not been sleeping well and he is very tired.  He is lying with his eyes closed and states that he is just exhausted and is having trouble thinking clearly.  He is agreeable to an inpatient stay especially because he is afraid to go home as he is afraid he will hurt himself.  He does have history of prior attempts.  PAST PSYCHIATRIC HISTORY  He has been hospitalized before the last one being 2020 He has had suicide attempts in the past He is currently not receiving any treatment Otherwise as per HPI above.  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 06/15/2018   Cancer of skin of neck    Cataract    Chronic mesenteric ischemia ([name])    Chronic pain syndrome 07/09/2016   Crohn's disease of small and large intestines ([name])    followed by dr Baldo Ash gessner   Dairy product intolerance    Diarrhea, functional    Family history of adverse reaction to anesthesia    Grover's disease    transient acantholytic dermatosis   History of alcohol abuse    History of basal cell carcinoma excision    2013 left leg   History of Clostridium difficile    10/ 2014   History of multiple concussions    x6   last one Jan 2017 per pt--  no residual   History of substance abuse ([name])    quit 1997 per pt   History of suicide attempt    05-18-2012  overdose /  failure to thrive   Iron deficiency anemia due to chronic blood loss    Major depression, recurrent, chronic ([name])    Osteopenia    Personal history of adenomatous colonic polyps 12/2010, 03/2012   12/2010 - 8 mm serrated adenoma of rectum   Portal vein thrombosis 03/21/2015   right   Primary sclerosing cholangitis    ? 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   Cholecalciferol (VITAMIN D) 125 MCG (5000 UT) CAPS Take 5,000 Units by mouth daily.   Ascorbic Acid (VITAMIN C) 1000 MG tablet Take 1,000 mg by mouth daily.   traZODone (DESYREL) 50 MG tablet Take 1 tablet (50 mg total) by mouth at bedtime.   vedolizumab (ENTYVIO) 300 MG injection Inject 300 mg into the vein every 6 (six) weeks.   diltiazem 2 % GEL Apply a pea size amount into rectum 2 times a day (Patient taking differently: Apply a pea size amount into rectum prn)   OVER THE  COUNTER MEDICATION Vitamin K- 45mg  daily   triamcinolone (NASACORT ALLERGY 24HR) 55 MCG/ACT AERO nasal inhaler Place 2 sprays into the nose daily.   ipratropium (ATROVENT) 0.06 % nasal spray Place 2 sprays into both nostrils 3 (three) times daily. Use nasal spray before consuming foods to help with mucus.   gabapentin (NEURONTIN) 600 MG tablet TAKE 1 TABLET BY MOUTH EVERYDAY AT BEDTIME   DUPIXENT 300 MG/2ML prefilled syringe Inject 300 mg into the skin every 14 (fourteen) days. (Patient not taking: Reported on 11/19/2022)   clonazePAM (KLONOPIN) 0.5 MG tablet Take 0.5 mg by mouth 2 (two) times daily.   HYDROcodone-acetaminophen (NORCO/VICODIN) 5-325 MG tablet Take 1 tablet by mouth every 6 (six) hours as needed for severe pain (pain score 7-10).   predniSONE (DELTASONE) 20 MG tablet Take 1.5 tablets (30 mg total) by mouth daily with breakfast.     ALLERGIES  Allergies  Allergen Reactions   Asacol [Mesalamine] Diarrhea    abd pain   Azathioprine Diarrhea  abd pain   Gluten Meal Diarrhea   Metoprolol Tartrate Nausea Only    Stomach upset   Mycophenolate Mofetil Diarrhea    abd pain   Other Other (See Comments)    NUTS; constipation   Wheat Diarrhea    Constipation; flatulence; abd pain   Amoxicillin Other (See Comments)    Lots of gas   Tape Itching and Rash    Please use "paper" tape   Tylenol [Acetaminophen] Other (See Comments)    SOCIAL & SUBSTANCE USE HISTORY  Social History   Socioeconomic History   Marital status: Single    Spouse name: Not on file   Number of children: 1   Years of education: Not on file   Highest education level: Not on file  Occupational History   Occupation: Disability   Tobacco Use   Smoking status: Former    Current packs/day: 0.00    Average packs/day: 1 pack/day for 15.0 years (15.0 ttl pk-yrs)    Types: Cigarettes    Start date: 03/28/1982    Quit date: 03/28/1997    Years since quitting: 26.0   Smokeless tobacco: Never  Vaping Use    Vaping status: Never Used  Substance and Sexual Activity   Alcohol use: No    Alcohol/week: 0.0 standard drinks of alcohol   Drug use: Not Currently    Types: Cocaine, Marijuana    Comment: QUIT USING DRUGS IN 1997   Sexual activity: Not Currently  Other Topics Concern   Not on file  Social History Narrative   Single, history of substance abuse in recovery   Previously occupied on medical disability   1 child  Daughter Wellsite geologist, lives and works in [place]   Elderly parents involved and he helps them   1 brother   Social Drivers of Corporate investment banker Strain: Low Risk  (08/28/2019)   Overall Financial Resource Strain (CARDIA)    Difficulty of Paying Living Expenses: Not hard at all  Food Insecurity: No Food Insecurity (08/28/2019)   Hunger Vital Sign    Worried About Running Out of Food in the Last Year: Never true    Ran Out of Food in the Last Year: Never true  Transportation Needs: No Transportation Needs (08/28/2019)   PRAPARE - Administrator, Civil Service (Medical): No    Lack of Transportation (Non-Medical): No  Physical Activity: Insufficiently Active (11/08/2019)   Exercise Vital Sign    Days of Exercise per Week: 3 days    Minutes of Exercise per Session: 30 min  Stress: Stress Concern Present (08/28/2019)   [employer] of Occupational Health - Occupational Stress Questionnaire    Feeling of Stress : Very much  Social Connections: Socially Isolated (11/08/2019)   Social Connection and Isolation Panel [NHANES]    Frequency of Communication with Friends and Family: Three times a week    Frequency of Social Gatherings with Friends and Family: Twice a week    Attends Religious Services: Never    Database administrator or Organizations: No    Attends Banker Meetings: Never    Marital Status: Divorced   Social History   Tobacco Use  Smoking Status Former   Current packs/day: 0.00   Average packs/day: 1 pack/day for 15.0 years  (15.0 ttl pk-yrs)   Types: Cigarettes   Start date: 03/28/1982   Quit date: 03/28/1997   Years since quitting: 26.0  Smokeless Tobacco Never   Social History  Substance and Sexual Activity  Alcohol Use No   Alcohol/week: 0.0 standard drinks of alcohol   Social History   Substance and Sexual Activity  Drug Use Not Currently   Types: Cocaine, Marijuana   Comment: QUIT USING DRUGS IN 1997    Additional pertinent information .  FAMILY HISTORY  Family History  Problem Relation Age of Onset   Heart disease Father    Thyroid cancer Father    Allergies Father    Clotting disorder Father    Breast cancer Mother    Stomach cancer Mother    Colon cancer Neg Hx    Esophageal cancer Neg Hx    Rectal cancer Neg Hx    Family Psychiatric History (if known):  unknown  MENTAL STATUS EXAM (MSE)  Mental Status Exam: General Appearance:  frail  Orientation:  Full (Time, Place, and Person)  Memory:  Immediate;   Good Recent;   Good Remote;   Fair  Concentration:  Concentration: Fair and Attention Span: Fair  Recall:  Fair  Attention  Poor  Eye Contact:  Fair  Speech:  Slow  Language:  Fair  Volume:  Decreased  Mood: depressed and anxious  Affect:  Flat  Thought Process:  Coherent  Thought Content:  Logical  Suicidal Thoughts:  Yes.  without intent/plan  Homicidal Thoughts:  No  Judgement:  Fair  Insight:  Fair  Psychomotor Activity:  Normal  Akathisia:  No  Fund of Knowledge:  Fair    Assets:  Financial Resources/Insurance  Cognition:  Impaired,  Mild due to feeling so sick and tired  ADL's:  Intact  AIMS (if indicated):       VITALS  Blood pressure (!) 176/98, pulse 73, temperature 98 F (36.7 C), temperature source Oral, resp. rate 14, height 6\' 1"  (1.854 m), weight 82.1 kg, SpO2 99%.  LABS  Admission on 03/28/2023  Component Date Value Ref Range Status   Sodium 03/28/2023 142  135 - 145 mmol/L Final   Potassium 03/28/2023 4.1  3.5 - 5.1 mmol/L Final   Chloride  03/28/2023 106  98 - 111 mmol/L Final   CO2 03/28/2023 30  22 - 32 mmol/L Final   Glucose, Bld 03/28/2023 141 (H)  70 - 99 mg/dL Final   Glucose reference range applies only to samples taken after fasting for at least 8 hours.   BUN 03/28/2023 16  6 - 20 mg/dL Final   Creatinine, Ser 03/28/2023 0.78  0.61 - 1.24 mg/dL Final   Calcium 13/09/6576 8.1 (L)  8.9 - 10.3 mg/dL Final   Total Protein 46/96/2952 5.1 (L)  6.5 - 8.1 g/dL Final   Albumin 84/13/2440 3.3 (L)  3.5 - 5.0 g/dL Final   AST 12/26/2534 99 (H)  15 - 41 U/L Final   ALT 03/28/2023 212 (H)  0 - 44 U/L Final   Alkaline Phosphatase 03/28/2023 97  38 - 126 U/L Final   Total Bilirubin 03/28/2023 0.5  0.0 - 1.2 mg/dL Final   GFR, Estimated 03/28/2023 >60  >60 mL/min Final   Comment: (NOTE) Calculated using the CKD-EPI Creatinine Equation (2021)    Anion gap 03/28/2023 6  5 - 15 Final   Performed at [facility], [address] [place], [place] [address]   Alcohol, Ethyl (B) 03/28/2023 <10  <10 mg/dL Final   Comment: (NOTE) Lowest detectable limit for serum alcohol is 10 mg/dL.  For medical purposes only. Performed at [facility], [address], [place], [place] [address]  Opiates 03/28/2023 POSITIVE (A)  NONE DETECTED Final   Cocaine 03/28/2023 NONE DETECTED  NONE DETECTED Final   Benzodiazepines 03/28/2023 NONE DETECTED  NONE DETECTED Final   Amphetamines 03/28/2023 NONE DETECTED  NONE DETECTED Final   Tetrahydrocannabinol 03/28/2023 NONE DETECTED  NONE DETECTED Final   Barbiturates 03/28/2023 NONE DETECTED  NONE DETECTED Final   Comment: (NOTE) DRUG SCREEN FOR MEDICAL PURPOSES ONLY.  IF CONFIRMATION IS NEEDED FOR ANY PURPOSE, NOTIFY LAB WITHIN 5 DAYS.  LOWEST DETECTABLE LIMITS FOR URINE DRUG SCREEN Drug Class                     Cutoff (ng/mL) Amphetamine and metabolites    1000 Barbiturate and metabolites    200 Benzodiazepine                 200 Opiates and metabolites         300 Cocaine and metabolites        300 THC                            50 Performed at [facility], [address], [place], [place] [address]    WBC 03/28/2023 9.9  4.0 - 10.5 K/uL Final   RBC 03/28/2023 4.76  4.22 - 5.81 MIL/uL Final   Hemoglobin 03/28/2023 15.3  13.0 - 17.0 g/dL Final   HCT 78/29/5621 46.4  39.0 - 52.0 % Final   MCV 03/28/2023 97.5  80.0 - 100.0 fL Final   MCH 03/28/2023 32.1  26.0 - 34.0 pg Final   MCHC 03/28/2023 33.0  30.0 - 36.0 g/dL Final   RDW 30/86/5784 15.2  11.5 - 15.5 % Final   Platelets 03/28/2023 139 (L)  150 - 400 K/uL Final   nRBC 03/28/2023 0.0  0.0 - 0.2 % Final   Neutrophils Relative % 03/28/2023 86  % Final   Neutro Abs 03/28/2023 8.5 (H)  1.7 - 7.7 K/uL Final   Lymphocytes Relative 03/28/2023 3  % Final   Lymphs Abs 03/28/2023 0.3 (L)  0.7 - 4.0 K/uL Final   Monocytes Relative 03/28/2023 4  % Final   Monocytes Absolute 03/28/2023 0.4  0.1 - 1.0 K/uL Final   Eosinophils Relative 03/28/2023 0  % Final   Eosinophils Absolute 03/28/2023 0.0  0.0 - 0.5 K/uL Final   Basophils Relative 03/28/2023 0  % Final   Basophils Absolute 03/28/2023 0.0  0.0 - 0.1 K/uL Final   Immature Granulocytes 03/28/2023 7  % Final   Abs Immature Granulocytes 03/28/2023 0.66 (H)  0.00 - 0.07 K/uL Final   Performed at [facility], [address], [place], [place] [address]   Salicylate Lvl 03/28/2023 0.0 (L)  7.0 - 30.0 mg/dL Final   Performed at [facility], [address], [place], [place] [address]   Acetaminophen (Tylenol), Serum 03/28/2023 <10 (L)  10 - 30 ug/mL Final   Comment: (NOTE) Therapeutic concentrations vary significantly. A range of 10-30 ug/mL  may be an effective concentration for many patients. However, some  are best treated at concentrations outside of this range. Acetaminophen concentrations >150 ug/mL at 4 hours after ingestion  and >50 ug/mL at 12 hours after ingestion are often  associated with  toxic reactions.  Performed at [facility], [address] [place], [place] [address]     PSYCHIATRIC REVIEW OF SYSTEMS (ROS)  ROS: Notable for the following relevant positive findings:  ROS  Additional findings:      Musculoskeletal: No abnormal movements observed      Gait & Station: Laying/Sitting      Pain Screening: Present - mild to moderate      Nutrition & Dental Concerns: If yes - consider referral to nutritional or dental specialist  RISK FORMULATION/ASSESSMENT  Is the patient experiencing any suicidal or homicidal ideations: Yes       Explain if yes: patient is having suicidal thoughts and is afraid he will act on them Protective factors considered for safety management: doesn't want to kill himself  Risk factors/concerns considered for safety management:  Prior attempt Depression Physical illness/chronic pain Male gender Unmarried  Is there a safety management plan with the patient and treatment team to minimize risk factors and promote protective factors: Yes           Explain: patient is being monitored in the ED Is crisis care placement or psychiatric hospitalization recommended: Yes     Based on my current evaluation and risk assessment, patient is determined at this time to be at:  High risk  *RISK ASSESSMENT Risk assessment is a dynamic process; it is possible that this patient's condition, and risk level, may change. This should be re-evaluated and managed over time as appropriate. Please re-consult psychiatric consult services if additional assistance is needed in terms of risk assessment and management. If your team decides to discharge this patient, please advise the patient how to best access emergency psychiatric services, or to call 911, if their condition worsens or they feel unsafe in any way.   Koren Shiver, NP Telepsychiatry Consult Services

## 2023-03-28 NOTE — Progress Notes (Addendum)
Pt was accepted to [facility] TODAY  03/28/2023; Bed Assignment 304-1 PENDING Signed Voluntary Consent uploaded to pt's chart, EKG, VS, and voluntary consent.   Address: [address] [place], [place] [address]  Pt meets inpatient criteria per Koren Shiver, NP-Telepsychiatry Consult Services  Attending Physician will be Dr. Phineas Inches, MD  Report can be called to: -Adult unit: [phone]  Pt can arrive after: [facility] will coordinate with care team upon completion of PENDING items  Care Team notified:[facility] Oluwatosin Olasunkanmi,RN, [address] [place],RN, [place], Esbeyde Pasco, Heather Stone,RN, Falencio Thompson,LCMHC, [facility], Day [facility] Rona Ravens, RN    [place], [place] 03/28/2023 @ 8:20 PM

## 2023-03-28 NOTE — ED Notes (Signed)
Staffing called for sitter, none avail at this time.

## 2023-03-29 DIAGNOSIS — K5090 Crohn's disease, unspecified, without complications: Secondary | ICD-10-CM | POA: Diagnosis present

## 2023-03-29 DIAGNOSIS — F332 Major depressive disorder, recurrent severe without psychotic features: Secondary | ICD-10-CM | POA: Diagnosis not present

## 2023-03-29 MED ORDER — HYDROXYZINE HCL 25 MG PO TABS
25.0000 mg | ORAL_TABLET | Freq: Three times a day (TID) | ORAL | Status: DC | PRN
  Administered 2023-03-29 – 2023-04-02 (×10): 25 mg via ORAL
  Filled 2023-03-29 (×10): qty 1

## 2023-03-29 MED ORDER — CLONIDINE HCL 0.1 MG PO TABS
0.1000 mg | ORAL_TABLET | ORAL | Status: AC
  Administered 2023-04-01 – 2023-04-02 (×4): 0.1 mg via ORAL
  Filled 2023-03-29 (×4): qty 1

## 2023-03-29 MED ORDER — ONDANSETRON 4 MG PO TBDP
4.0000 mg | ORAL_TABLET | Freq: Four times a day (QID) | ORAL | Status: DC | PRN
  Administered 2023-04-04: 4 mg via ORAL
  Filled 2023-03-29: qty 1

## 2023-03-29 MED ORDER — CLONIDINE HCL 0.1 MG PO TABS
0.1000 mg | ORAL_TABLET | Freq: Four times a day (QID) | ORAL | Status: AC
  Administered 2023-03-29 – 2023-03-31 (×7): 0.1 mg via ORAL
  Filled 2023-03-29 (×11): qty 1

## 2023-03-29 MED ORDER — PREDNISONE 10 MG PO TABS
30.0000 mg | ORAL_TABLET | Freq: Every day | ORAL | Status: DC
  Administered 2023-03-29 – 2023-04-04 (×7): 30 mg via ORAL
  Filled 2023-03-29: qty 3
  Filled 2023-03-29: qty 6
  Filled 2023-03-29 (×7): qty 3

## 2023-03-29 MED ORDER — METHOCARBAMOL 500 MG PO TABS
500.0000 mg | ORAL_TABLET | Freq: Three times a day (TID) | ORAL | Status: AC | PRN
  Administered 2023-04-02 (×2): 500 mg via ORAL
  Filled 2023-03-29 (×2): qty 1

## 2023-03-29 MED ORDER — LOPERAMIDE HCL 2 MG PO CAPS: 2.0000 mg | ORAL_CAPSULE | ORAL | Status: AC | PRN

## 2023-03-29 MED ORDER — CLONAZEPAM 0.5 MG PO TABS: 0.5000 mg | ORAL_TABLET | Freq: Two times a day (BID) | ORAL | Status: DC

## 2023-03-29 MED ORDER — DICYCLOMINE HCL 10 MG PO CAPS
10.0000 mg | ORAL_CAPSULE | Freq: Three times a day (TID) | ORAL | Status: DC
  Administered 2023-03-30 – 2023-04-04 (×17): 10 mg via ORAL
  Filled 2023-03-29 (×21): qty 1

## 2023-03-29 MED ORDER — CLONIDINE HCL 0.1 MG PO TABS
0.1000 mg | ORAL_TABLET | Freq: Every day | ORAL | Status: AC
  Administered 2023-04-03 – 2023-04-04 (×2): 0.1 mg via ORAL
  Filled 2023-03-29 (×2): qty 1

## 2023-03-29 NOTE — H&P (Cosign Needed Addendum)
Psychiatric Admission Assessment Adult  Patient Identification: [name] MRN:  [id number] Date of Evaluation:  03/29/2023 Chief Complaint:  Anxiety and depression [F41.9, F32.A] Principal Diagnosis: MDD (major depressive disorder), recurrent severe, without psychosis ([name]) Diagnosis:  Principal Problem:   MDD (major depressive disorder), recurrent severe, without psychosis ([name]) Active Problems:   Insomnia   GAD (generalized anxiety disorder)   Crohn's disease ([name])  CC: "States having anxiety. States has had for years. States been on medication which he weaned off. Anxiety in last week has come back and started back on medication. States afraid to be by himself. Afraid he may kill himself. States had attempts in the passed. Expresses does not want to kill self." Laureen Ochs, Kittie Plater, RN, Date of Service: 03/28/2023  9:02 AM)  Reason for admission: Patient is a 58 year old Caucasian male with a reported prior mental health diagnoses of MDD & GAD who presented to the [facility] [address] ER with complaints of SI with no plan & worsening anxiety.  While at the ER, patient reported "that he takes Klonopin 0.5 mg 2-3 times daily and only has 10 more pills...currently, in ED he has no SI nor plan for suicide. He denies CP, SOB, taking extra medication today." (Per ER documentation). However, in spite of denying suicidal ideation, he was transferred to this hospital on 1/27, where he was voluntarily admitted for treatment and stabilization of his mental status.  Assessment & ROS: During encounter with patient, he denies suicidal ideation, denies that he was feeling suicidal prior to being transferred to this behavioral health Hospital, reports that he had gone there because he was feeling very anxious, and that he was about to run out of his Klonopin.  He reports that he has also been very depressed, but denies most depressive symptoms, side insomnia, decreased energy levels, but states that this  is because he is not eating well related to his Crohn's disease, he reports a poor appetite, and feeling like he wants to curl and sleep all day, and feeling hopeless, but cites his medical comorbidities as being the reason for the way he is feeling, denies that being clinically depressed has anything to do with how he is feeling.  Patient reports that he has been on Klonopin for several years, but as per the PDMP review, Klonopin has not been ordered to him since August 2024.  He reports that Dr. [name] Reil, his GI physician at [facility] health prescribes this medication for him.  Every effort made by patient in attempting to get prescriptions of chronic pain points to the fact that his main reason for seeking hospitalization might have been related to wanting a prescription of this medication.  He shows no interest in any other programming activities here on the unit, shows no interest in psychotropic medications when education is provided to him on SSRI type medications, as well as mirtazapine for management of his depressive symptoms.  He continues to cite GI issues as the reason why he does not want to take any medications.  Mood is depressed, apathetic, and dysphoric.  Patient is somatic, complains that the food at the hospital with "mess up my stomach", but refuses to let staff let the dietary department accommodate a gluten-free diet for him, but prefers to pick from the choices in the cafeteria.  Patient reports that his stressors are his Crohn's disease, taking care of his elderly parents; shares that his father was recently diagnosed with dementia, and his mother also has medical problems.  He reports that he resides with them and takes care of them, which is stressful for him.  He denies most depressive symptoms as stated above, reports excessive worry, restlessness, feeling on edge, easily fatigued, poor sleep and poor concentration, but then states that he has a diagnosis of GAD.  He reports racing  thoughts, reports fear of performance situations.  Reports a lot more accident at age 41 years old, which gave him his diagnosis of PTSD, but currently denies any PTSD type symptoms with exception of hypervigilance.  Reports past panic attacks where he feels like he is "frozen" and scared, but states that this does not happen frequently.  Denies any symptoms significant for, psychosis or OCD in the past or recently.  He denies self-injurious behaviors.  He denies any eating disorders. Reports specific phobias of crowds, performance situations due to fear of feeling embarrassed or humiliated.  Past mental health history: Patient reports a diagnosis of MDD and GAD in the past as well as PTSD as mentioned above.  He reports past suicide attempts, states that he cut himself 4 years ago in a suicide attempt, and drank bleach 10 years ago in a suicide attempt, but then states that none of them required hospitalization.  He reports only 1 mental health related hospitalization in [place] in 2018.  As per chart review, patient presented to the [facility], ED and was hospitalized on 05/18/2012 for an intentional overdose on nortriptyline.  Patient reports that he has had trials of Zoloft, and Prozac in the past, but they did not agree with his stomach due to his Crohn's disease.  He reports that he is not interested in starting any of these medications here at the hospital, given the rationales, benefits, and possible side effects were discussed with him by attending psychiatrist as well as Clinical research associate.  Attending psychiatrist also discussed mirtazapine which is GI friendly and will stimulate his appetite, but patient declined.  He remained focused on wanting Klonopin.  He has been educated that this medication will not be prescribed during this hospitalization.  Substance use: Patient reports a history of alcohol use, but states that he has been sober for 27 years, and was able to sustain his sobriety by attending 3 AA  meetings per week for the past 20 years.  He denies current substance use including cocaine and THC, nicotine, meth, and any other substance is not mentioned.  Family history: Patient reports depression and GAD in his mother. Denies any other mental health problems in his family, reports medical problems of dementia and his father, and cancer in his entire family.  Denies substance use in his family with the exception of him.  Social history: Patient reports that he resides with his elderly parents, and helps take care of them.  He reports that he has 1 daughter who is 62 years old, and who is supportive.  Reports that he has 1 sibling who is also supportive, and who took him to the hospital on the way of his presentation to the ER.  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  Reports that his hobbies yard work and playing music.  Sexual orientation is heterosexual, he reports being single, denies ever being in the [employer], or witnessing violence, denies legal issues.  Denies any emotional, physical, or sexual abuse in the past or recently.  Reports that his father is a "bully".  Denies being in any legal problems, denies ever being in the [employer].  Pt with flat affect and depressed mood, attention to personal hygiene and grooming is fair, eye contact is good, speech is clear & coherent. Thought contents are organized and logical, and pt currently denies SI/HI/AVH or paranoia. There is no evidence of delusional thoughts.  Patient persistently asked to be transferred to the [facility] ED, seems not to want to be here at [facility] due to demands for Klonopin being denied.  Patient has not consented for his brother to be called for collateral information today, and provider covering him tomorrow or CSW will need to obtain some collateral information from pt's family.  Associated Signs/Symptoms: Depression  Symptoms:  depressed mood, anhedonia, insomnia, psychomotor retardation, fatigue, feelings of worthlessness/guilt, difficulty concentrating, hopelessness, suicidal thoughts without plan, anxiety, loss of energy/fatigue, disturbed sleep, decreased appetite, (Hypo) Manic Symptoms:  Distractibility, Impulsivity, Irritable Mood, Anxiety Symptoms:  Excessive Worry, Psychotic Symptoms:   n/a PTSD Symptoms: NA Total Time spent with patient: 1.5 hours  Is the patient at risk to self? Yes.    Has the patient been a risk to self in the past 6 months? Yes.    Has the patient been a risk to self within the distant past? Yes.    Is the patient a risk to others? No.  Has the patient been a risk to others in the past 6 months? No.  Has the patient been a risk to others within the distant past? No.   [place] Scale:  Flowsheet Row Ad[facility] (Current) [facility] Most recent reading at 03/28/2023 10:42 PM ED from 03/28/2023 in [facility] Emergency Department at [facility] Most recent reading at 03/28/2023  9:09 AM ED from 11/03/2022 in [facility] Emergency Department at [facility] Most recent reading at 11/03/2022 12:59 PM  C-SSRS RISK CATEGORY Low Risk Low Risk No Risk       Alcohol Screening: 1. How often do you have a drink containing alcohol?: Never 2. How many drinks containing alcohol do you have on a typical day when you are drinking?: 1 or 2 3. How often do you have six or more drinks on one occasion?: Never AUDIT-C Score: 0 4. How often during the last year have you found that you were not able to stop drinking once you had started?: Never 5. How often during the last year have you failed to do what was normally expected from you because of drinking?: Never 6. How often during the last year have you needed a first drink in the morning to get yourself going after a heavy drinking session?: Never 7. How often during the  last year have you had a feeling of guilt of remorse after drinking?: Never 8. How often during the last year have you been unable to remember what happened the night before because you had been drinking?: Never 9. Have you or someone else been injured as a result of your drinking?: No 10. Has a relative or friend or a doctor or another health worker been concerned about your drinking or suggested you cut down?: No Alcohol Use Disorder Identification Test Final Score (AUDIT): 0 Alcohol Brief Interventions/Follow-up: Alcohol education/Brief advice Substance Abuse History in the last 12 months:  No. 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 06/15/2018   Cancer of skin of neck    Cataract    Chronic mesenteric ischemia ([name])    Chronic pain syndrome 07/09/2016   Crohn's disease of small and large intestines ([name])    followed by dr Baldo Ash gessner   Dairy product intolerance    Diarrhea, functional    Family history of adverse reaction to anesthesia    Grover's disease    transient acantholytic dermatosis   History of alcohol abuse    History of basal cell carcinoma excision    2013 left leg   History of Clostridium difficile    10/ 2014   History of multiple concussions    x6   last one Jan 2017 per pt--  no residual   History of substance abuse ([name])    quit 1997 per pt   History of suicide attempt    05-18-2012  overdose /  failure to thrive   Iron deficiency anemia due to chronic blood loss    Major depression, recurrent, chronic ([name])    Osteopenia    Personal history of adenomatous colonic polyps 12/2010, 03/2012    12/2010 - 8 mm serrated adenoma of rectum   Portal vein thrombosis 03/21/2015   right   Primary sclerosing cholangitis    ? 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   Substance abuse ([name]) 1997   Alcohol   Vitamin A deficiency 05/23/2018   Vitamin B6 deficiency 07/27/2018   Vitamin C deficiency 05/23/2018    Past Surgical History:  Procedure Laterality Date   ABDOMINAL AORTAGRAM N/A 03/29/2012   Procedure: ABDOMINAL Ronny Flurry;  Surgeon: Nada Libman, MD;  Location: [facility] CATH LAB;  Service: Cardiovascular;  Laterality: N/A;   ADENOIDECTOMY  age 9   BIOPSY  05/31/2018   Procedure: BIOPSY;  Surgeon: Rachael Fee, MD;  Location: [facility] ENDOSCOPY;  Service: Endoscopy;;   CATARACT EXTRACTION W/ INTRAOCULAR LENS  IMPLANT, BILATERAL  2009   COLONOSCOPY  2001, 05/02/2003, 01/28/11   2012: Right colon Crohn's, rectal polyp   COLONOSCOPY  03/31/2012   Procedure: COLONOSCOPY;  Surgeon: Beverley Fiedler, MD;  Location: [facility] ENDOSCOPY;  Service: Gastroenterology;  Laterality: N/A;   COLONOSCOPY WITH PROPOFOL N/A 05/31/2018   Procedure: COLONOSCOPY WITH PROPOFOL;  Surgeon: Rachael Fee, MD;  Location: [facility] ENDOSCOPY;  Service: Endoscopy;  Laterality: N/A;   ESOPHAGOGASTRODUODENOSCOPY  01/28/2011   Normal   FOOT SURGERY Right age 31   MOHS SURGERY Left 11/2013   left ankle parakerotosis    PERCUTANEOUS LIVER BIOPSY  2007 and 2008   PILONIDAL CYST EXCISION  age 60   PLACEMENT OF SETON N/A 11/06/2015   Procedure: PLACEMENT OF SETON;  Surgeon: Romie Levee, MD;  Location: [facility];  Service: General;  Laterality: N/A;   PLACEMENT OF SETON  07/2018   at [facility]   RECTAL EXAM UNDER ANESTHESIA N/A 01/18/2019   Procedure: ANAL EXAM UNDER ANESTHESIA,  INCISION AND DRAINAGE, SETON PLACEMENT;  Surgeon: Romie Levee, MD;  Location: [facility];  Service: General;  Laterality: N/A;   UPPER GASTROINTESTINAL  ENDOSCOPY     Family History:  Family History  Problem Relation Age of Onset  Heart disease Father    Thyroid cancer Father    Allergies Father    Clotting disorder Father    Breast cancer Mother    Stomach cancer Mother    Colon cancer Neg Hx    Esophageal cancer Neg Hx    Rectal cancer Neg Hx    Family Psychiatric  History: see aove Tobacco Screening:  Social History   Tobacco Use  Smoking Status Former   Current packs/day: 0.00   Average packs/day: 1 pack/day for 15.0 years (15.0 ttl pk-yrs)   Types: Cigarettes   Start date: 03/28/1982   Quit date: 03/28/1997   Years since quitting: 26.0  Smokeless Tobacco Never    BH Tobacco Counseling     Are you interested in Tobacco Cessation Medications?  No value filed. Counseled patient on smoking cessation:  No value filed. Reason Tobacco Screening Not Completed: No value filed.       Social History:  Social History   Substance and Sexual Activity  Alcohol Use No   Alcohol/week: 0.0 standard drinks of alcohol     Social History   Substance and Sexual Activity  Drug Use Not Currently   Types: Cocaine, Marijuana   Comment: QUIT USING DRUGS IN 1997  Allergies:   Allergies  Allergen Reactions   Asacol [Mesalamine] Diarrhea    abd pain   Azathioprine Diarrhea    abd pain   Gluten Meal Diarrhea   Metoprolol Tartrate Nausea Only    Stomach upset   Mycophenolate Mofetil Diarrhea    abd pain   Other Other (See Comments)    NUTS; constipation   Wheat Diarrhea    Constipation; flatulence; abd pain   Amoxicillin Other (See Comments)    Lots of gas   Tape Itching and Rash    Please use "paper" tape   Tylenol [Acetaminophen] Other (See Comments)   Lab Results:  Results for orders placed or performed during the hospital encounter of 03/28/23 (from the past 48 hours)  Comprehensive metabolic panel     Status: Abnormal   Collection Time: 03/28/23 10:06 AM  Result Value Ref Range   Sodium 142 135 - 145 mmol/L    Potassium 4.1 3.5 - 5.1 mmol/L   Chloride 106 98 - 111 mmol/L   CO2 30 22 - 32 mmol/L   Glucose, Bld 141 (H) 70 - 99 mg/dL    Comment: Glucose reference range applies only to samples taken after fasting for at least 8 hours.   BUN 16 6 - 20 mg/dL   Creatinine, Ser 8.29 0.61 - 1.24 mg/dL   Calcium 8.1 (L) 8.9 - 10.3 mg/dL   Total Protein 5.1 (L) 6.5 - 8.1 g/dL   Albumin 3.3 (L) 3.5 - 5.0 g/dL   AST 99 (H) 15 - 41 U/L   ALT 212 (H) 0 - 44 U/L   Alkaline Phosphatase 97 38 - 126 U/L   Total Bilirubin 0.5 0.0 - 1.2 mg/dL   GFR, Estimated >56 >21 mL/min    Comment: (NOTE) Calculated using the CKD-EPI Creatinine Equation (2021)    Anion gap 6 5 - 15    Comment: Performed at [facility], [address], [place], [place] [address]  Ethanol     Status: None   Collection Time: 03/28/23 10:06 AM  Result Value Ref Range   Alcohol, Ethyl (B) <10 <10 mg/dL    Comment: (NOTE) Lowest detectable limit for serum alcohol is 10 mg/dL.  For medical purposes only. Performed at  [facility], [address] [place], [place], [place] [address]   CBC with Diff     Status: Abnormal   Collection Time: 03/28/23 10:06 AM  Result Value Ref Range   WBC 9.9 4.0 - 10.5 K/uL   RBC 4.76 4.22 - 5.81 MIL/uL   Hemoglobin 15.3 13.0 - 17.0 g/dL   HCT 60.4 54.0 - 98.1 %   MCV 97.5 80.0 - 100.0 fL   MCH 32.1 26.0 - 34.0 pg   MCHC 33.0 30.0 - 36.0 g/dL   RDW 19.1 47.8 - 29.5 %   Platelets 139 (L) 150 - 400 K/uL   nRBC 0.0 0.0 - 0.2 %   Neutrophils Relative % 86 %   Neutro Abs 8.5 (H) 1.7 - 7.7 K/uL   Lymphocytes Relative 3 %   Lymphs Abs 0.3 (L) 0.7 - 4.0 K/uL   Monocytes Relative 4 %   Monocytes Absolute 0.4 0.1 - 1.0 K/uL   Eosinophils Relative 0 %   Eosinophils Absolute 0.0 0.0 - 0.5 K/uL   Basophils Relative 0 %   Basophils Absolute 0.0 0.0 - 0.1 K/uL   Immature Granulocytes 7 %   Abs Immature Granulocytes 0.66 (H) 0.00 - 0.07 K/uL    Comment: Performed at [facility], [address] [place], [place] [address]  Salicylate level     Status: Abnormal   Collection Time: 03/28/23 10:06 AM  Result Value Ref Range   Salicylate Lvl 0.0 (L) 7.0 - 30.0 mg/dL    Comment: Performed at [facility], [address] [place], [place] [address]  Acetaminophen level     Status: Abnormal   Collection Time: 03/28/23 10:06 AM  Result Value Ref Range   Acetaminophen (Tylenol), Serum <10 (L) 10 - 30 ug/mL    Comment: (NOTE) Therapeutic concentrations vary significantly. A range of 10-30 ug/mL  may be an effective concentration for many patients. However, some  are best treated at concentrations outside of this range. Acetaminophen concentrations >150 ug/mL at 4 hours after ingestion  and >50 ug/mL at 12 hours after ingestion are often associated with  toxic reactions.  Performed at [facility], [address] [place], [place] [address]   Urine rapid drug screen (hosp performed)     Status: Abnormal   Collection Time: 03/28/23 11:45 AM  Result Value Ref Range   Opiates POSITIVE (A) NONE DETECTED   Cocaine NONE DETECTED NONE DETECTED   Benzodiazepines NONE DETECTED NONE DETECTED   Amphetamines NONE DETECTED NONE DETECTED   Tetrahydrocannabinol NONE DETECTED NONE DETECTED   Barbiturates NONE DETECTED NONE DETECTED    Comment: (NOTE) DRUG SCREEN FOR MEDICAL PURPOSES ONLY.  IF CONFIRMATION IS NEEDED FOR ANY PURPOSE, NOTIFY LAB WITHIN 5 DAYS.  LOWEST DETECTABLE LIMITS FOR URINE DRUG SCREEN Drug Class                     Cutoff (ng/mL) Amphetamine and metabolites    1000 Barbiturate and metabolites    200 Benzodiazepine                 200 Opiates and metabolites        300 Cocaine and metabolites        300 THC                            50 Performed at [facility], [address] [place], [place] [address]    *  Note: Due to a large number of results and/or encounters for the  requested time period, some results have not been displayed. A complete set of results can be found in Results Review.    Blood Alcohol level:  Lab Results  Component Value Date   ETH <10 03/28/2023   ETH <10 08/26/2018    Metabolic Disorder Labs:  Lab Results  Component Value Date   HGBA1C 6.1 09/18/2014   No results found for: "PROLACTIN" Lab Results  Component Value Date   CHOL 288 (H) 02/05/2011   TRIG 76.0 02/05/2011   HDL 62.50 02/05/2011   CHOLHDL 5 02/05/2011   VLDL 15.2 02/05/2011    Current Medications: Current Facility-Administered Medications  Medication Dose Route Frequency Provider Last Rate Last Admin   alum & mag hydroxide-simeth (MAALOX/MYLANTA) 200-200-20 MG/5ML suspension 30 mL  30 mL Oral Q4H PRN Onuoha, Chinwendu V, NP       cloNIDine (CATAPRES) tablet 0.1 mg  0.1 mg Oral QID Starleen Blue, NP       Followed by   Melene Muller ON 04/01/2023] cloNIDine (CATAPRES) tablet 0.1 mg  0.1 mg Oral BH-qamhs Bevin Mayall, NP       Followed by   Melene Muller ON 04/03/2023] cloNIDine (CATAPRES) tablet 0.1 mg  0.1 mg Oral QAC breakfast Starleen Blue, NP       Melene Muller ON 03/30/2023] dicyclomine (BENTYL) capsule 10 mg  10 mg Oral TID AC Alina Gilkey, NP       haloperidol (HALDOL) tablet 5 mg  5 mg Oral TID PRN Onuoha, Chinwendu V, NP       And   diphenhydrAMINE (BENADRYL) capsule 50 mg  50 mg Oral TID PRN Onuoha, Chinwendu V, NP       haloperidol lactate (HALDOL) injection 5 mg  5 mg Intramuscular TID PRN Onuoha, Chinwendu V, NP       And   diphenhydrAMINE (BENADRYL) injection 50 mg  50 mg Intramuscular TID PRN Onuoha, Chinwendu V, NP       And   LORazepam (ATIVAN) injection 2 mg  2 mg Intramuscular TID PRN Onuoha, Chinwendu V, NP       haloperidol lactate (HALDOL) injection 10 mg  10 mg Intramuscular TID PRN Onuoha, Chinwendu V, NP       And   diphenhydrAMINE (BENADRYL) injection 50 mg  50 mg Intramuscular TID PRN Onuoha, Chinwendu V, NP       And   LORazepam (ATIVAN)  injection 2 mg  2 mg Intramuscular TID PRN Onuoha, Chinwendu V, NP       hydrOXYzine (ATARAX) tablet 25 mg  25 mg Oral TID PRN Starleen Blue, NP   25 mg at 03/29/23 1728   loperamide (IMODIUM) capsule 2-4 mg  2-4 mg Oral PRN Starleen Blue, NP       magnesium hydroxide (MILK OF MAGNESIA) suspension 30 mL  30 mL Oral Daily PRN Onuoha, Chinwendu V, NP       methocarbamol (ROBAXIN) tablet 500 mg  500 mg Oral Q8H PRN Bard Haupert, NP       ondansetron (ZOFRAN-ODT) disintegrating tablet 4 mg  4 mg Oral Q6H PRN Jodelle Fausto, NP       predniSONE (DELTASONE) tablet 30 mg  30 mg Oral Q breakfast Meili Kleckley, NP   30 mg at 03/29/23 1211   PTA Medications: Medications Prior to Admission  Medication Sig Dispense Refill Last Dose/Taking   Ascorbic Acid (VITAMIN C) 1000 MG tablet Take 1,000 mg by mouth daily.  Taking   predniSONE (DELTASONE) 20 MG tablet Take 1.5 tablets (30 mg total) by mouth daily with breakfast. 100 tablet 2 Taking   vedolizumab (ENTYVIO) 300 MG injection Inject 300 mg into the vein every 6 (six) weeks. 1 each 9 Taking   clonazePAM (KLONOPIN) 0.5 MG tablet Take 0.5 mg by mouth 2 (two) times daily.       Musculoskeletal: Strength & Muscle Tone: within normal limits Gait & Station: normal Patient leans: N/A  Psychiatric Specialty Exam:  Presentation  General Appearance: Fairly Groomed  Eye Contact:Fair  Speech:Clear and Coherent  Speech Volume:Normal  Handedness:Right   Mood and Affect  Mood:Depressed; Anxious  Affect:Congruent   Thought Process  Thought Processes:Coherent  Duration of Psychotic Symptoms: >2 weeks Past Diagnosis of Schizophrenia or Psychoactive disorder: No data recorded Descriptions of Associations:Intact  Orientation:Full (Time, Place and Person)  Thought Content:Logical  Hallucinations:Hallucinations: None  Ideas of Reference:None  Suicidal Thoughts:Suicidal Thoughts: No  Homicidal Thoughts:Homicidal Thoughts:  No   Sensorium  Memory:Recent Fair  Judgment:Poor  Insight:Poor   Executive Functions  Concentration:Poor  Attention Span:Fair  Recall:Fair  Fund of Knowledge:Fair  Language:No data recorded  Psychomotor Activity  Psychomotor Activity:Psychomotor Activity: Normal   Assets  Assets:Resilience; Social Support   Sleep  Sleep:Sleep: Fair    Physical Exam: Physical Exam Constitutional:      Appearance: Normal appearance.  Musculoskeletal:        General: Normal range of motion.     Cervical back: Normal range of motion.  Neurological:     General: No focal deficit present.     Mental Status: He is alert and oriented to person, place, and time.    Review of Systems  Constitutional: Negative.   Psychiatric/Behavioral:  Positive for depression and substance abuse. Negative for hallucinations, memory loss and suicidal ideas. The patient is nervous/anxious and has insomnia.   All other systems reviewed and are negative.  Blood pressure 120/80, pulse 90, temperature (!) 97.5 F (36.4 C), temperature source Oral, resp. rate 12, height 6\' 1"  (1.854 m), weight 68.5 kg, SpO2 97%. Body mass index is 19.92 kg/m.  Treatment Plan Summary: Daily contact with patient to assess and evaluate symptoms and progress in treatment and Medication management  Safety and Monitoring: Voluntary admission to inpatient psychiatric unit for safety, stabilization and treatment Daily contact with patient to assess and evaluate symptoms and progress in treatment Patient's case to be discussed in multi-disciplinary team meeting Observation Level : q15 minute checks Vital signs: q12 hours Precautions: Safety  Long Term Goal(s): Improvement in symptoms so as ready for discharge  Short Term Goals: Ability to identify changes in lifestyle to reduce recurrence of condition will improve, Ability to verbalize feelings will improve, Ability to disclose and discuss suicidal ideas, Ability to  demonstrate self-control will improve, Ability to identify and develop effective coping behaviors will improve, Ability to maintain clinical measurements within normal limits will improve, and Compliance with prescribed medications will improve  Diagnoses Principal Problem:   MDD (major depressive disorder), recurrent severe, without psychosis ([name]) Active Problems:   Insomnia   GAD (generalized anxiety disorder)   Crohn's disease ([name])  Medications -Patient declined offers of Remeron for treatment of insomnia & depressive symptoms -Restart Prednisone 30 mg daily for Chrohns disease (Home med) -Start Bentyl 10 mg BID for stomach spasms -Start CoWS with Clonidine for Opiates use d/o-UDS +for Opiates See the [facility] for details   PRNS -Start Zofran 4 mg every 6 hours as needed nausea -Continue agitation  protocol as shown on the [facility] -Continue Tylenol 650 mg every 6 hours PRN for mild pain -Continue Maalox 30 mg every 4 hrs PRN for indigestion -Continue Milk of Magnesia as needed every 6 hrs for constipation  Labs reviewed: Liver enzymes are elevated, ordering repeat hepatic function panel along with hepatitis panel.  Ordering CBC, CMP, lipid panel, hemoglobin A1c, vitamin D, B12.  EKG reviewed.  Discharge Planning: Social work and case management to assist with discharge planning and identification of hospital follow-up needs prior to discharge Estimated LOS: 5-7 days Discharge Concerns: Need to establish a safety plan; Medication compliance and effectiveness Discharge Goals: Return home with outpatient referrals for mental health follow-up including medication management/psychotherapy  I certify that inpatient services furnished can reasonably be expected to improve the patient's condition.    Starleen Blue, NP 1/28/20257:03 PM

## 2023-03-29 NOTE — Plan of Care (Signed)
  Problem: Education: Goal: Knowledge of [facility] Education information/materials will improve Outcome: Not Progressing Goal: Emotional status will improve Outcome: Not Progressing Goal: Mental status will improve Outcome: Not Progressing Goal: Verbalization of understanding the information provided will improve Outcome: Not Progressing   Problem: Activity: Goal: Interest or engagement in activities will improve Outcome: Not Progressing Goal: Sleeping patterns will improve Outcome: Not Progressing   Problem: Coping: Goal: Ability to verbalize frustrations and anger appropriately will improve Outcome: Not Progressing Goal: Ability to demonstrate self-control will improve Outcome: Not Progressing   Problem: Health Behavior/Discharge Planning: Goal: Identification of resources available to assist in meeting health care needs will improve Outcome: Not Progressing Goal: Compliance with treatment plan for underlying cause of condition will improve Outcome: Not Progressing   Problem: Physical Regulation: Goal: Ability to maintain clinical measurements within normal limits will improve Outcome: Not Progressing   Problem: Safety: Goal: Periods of time without injury will increase Outcome: Not Progressing   Problem: Education: Goal: Knowledge of [facility] Education information/materials will improve Outcome: Not Progressing Goal: Emotional status will improve Outcome: Not Progressing Goal: Mental status will improve Outcome: Not Progressing Goal: Verbalization of understanding the information provided will improve Outcome: Not Progressing   Problem: Activity: Goal: Interest or engagement in activities will improve Outcome: Not Progressing Goal: Sleeping patterns will improve Outcome: Not Progressing   Problem: Coping: Goal: Ability to verbalize frustrations and anger appropriately will improve Outcome: Not Progressing Goal: Ability to demonstrate  self-control will improve Outcome: Not Progressing   Problem: Health Behavior/Discharge Planning: Goal: Identification of resources available to assist in meeting health care needs will improve Outcome: Not Progressing Goal: Compliance with treatment plan for underlying cause of condition will improve Outcome: Not Progressing   Problem: Physical Regulation: Goal: Ability to maintain clinical measurements within normal limits will improve Outcome: Not Progressing   Problem: Safety: Goal: Periods of time without injury will increase Outcome: Not Progressing   Problem: Education: Goal: Utilization of techniques to improve thought processes will improve Outcome: Not Progressing Goal: Knowledge of the prescribed therapeutic regimen will improve Outcome: Not Progressing   Problem: Activity: Goal: Interest or engagement in leisure activities will improve Outcome: Not Progressing Goal: Imbalance in normal sleep/wake cycle will improve Outcome: Not Progressing   Problem: Coping: Goal: Coping ability will improve Outcome: Not Progressing Goal: Will verbalize feelings Outcome: Not Progressing   Problem: Health Behavior/Discharge Planning: Goal: Ability to make decisions will improve Outcome: Not Progressing Goal: Compliance with therapeutic regimen will improve Outcome: Not Progressing   Problem: Role Relationship: Goal: Will demonstrate positive changes in social behaviors and relationships Outcome: Not Progressing   Problem: Safety: Goal: Ability to disclose and discuss suicidal ideas will improve Outcome: Not Progressing Goal: Ability to identify and utilize support systems that promote safety will improve Outcome: Not Progressing   Problem: Self-Concept: Goal: Will verbalize positive feelings about self Outcome: Not Progressing Goal: Level of anxiety will decrease Outcome: Not Progressing

## 2023-03-29 NOTE — Progress Notes (Signed)
   03/29/23 0554  15 Minute Checks  Location Bedroom  Visual Appearance Calm  Behavior Sleeping  Sleep (Behavioral Health Patients Only)  Calculate sleep? (Click Yes once per 24 hr at 0600 safety check) Yes  Documented sleep last 24 hours 4

## 2023-03-29 NOTE — Group Note (Signed)
LCSW Group Therapy Note   Group Date: 03/29/2023 Start Time: 1100 End Time: 1200   Type of Therapy:  Group Therapy  Topic:  Finding Balance: Using Wise Mind for Thoughtful Decisions  Participation Level:  did not attend  Objective:  the objective of this class is to help participants understand the concept of Wise Mind and learn how to apply it to real-life situations to make balanced, thoughtful decisions. Participants will gain tools to manage emotions, consider logic, and find a middle ground that leads to healthier responses and outcomes.  Goals: Understand the concept of Wise Mind.  Participants will learn the difference between Emotional Mind, Reasonable Mind, and Weston Settle Mind, and how Weston Settle Mind helps in balancing emotions and logic to make thoughtful decisions. Recognize when you're in Emotional Mind or Reasonable Mind.  Participants will identify the signs of Emotional Mind and Reasonable Mind in their own reactions to situations and understand how to move into Wise Mind for more balanced responses. Practice applying Weston Settle Mind to real-life situations.  Through scenarios and group activities, participants will practice using Wise Mind in everyday situations, learning how to acknowledge their emotions, think logically, and create solutions that are thoughtful and balanced.  Summary:  In this class, we explored the concept of Wise Mind--the balance between Emotional Mind and Reasonable Mind. We discussed how Emotional Mind can sometimes lead to impulsive, reactive decisions driven by intense feelings, and how Reasonable Mind might ignore feelings altogether, focusing only on facts and logic. Weston Settle Mind is the middle ground that combines both, allowing you to consider your emotions and use logic to make balanced, thoughtful decisions.  We learned how to recognize when we're in Emotional Mind or Reasonable Mind, and practiced using Wise Mind in real-life situations. By using Alfonse Flavors, we can  improve how we handle challenging situations, make better decisions, and strengthen our relationships with others.   Jameika Kinn O Sadako Cegielski, LCSWA 03/29/2023  1:32 PM

## 2023-03-29 NOTE — Group Note (Signed)
Recreation Therapy Group Note   Group Topic:Animal Assisted Therapy   Group Date: 03/29/2023 Start Time: 0944 End Time: 1030 Facilitators: Zollie Clemence-McCall, LRT,CTRS Location: [address] Dayroom   Animal-Assisted Activity (AAA) Program Checklist/Progress Notes Patient Eligibility Criteria Checklist & Daily Group note for Rec Tx Intervention  AAA/T Program Assumption of Risk Form signed by Patient/ or Parent Legal Guardian Yes  Patient understands his/her participation is voluntary Yes  Education: Charity fundraiser, Appropriate Animal Interaction   Education Outcome: Acknowledges education.    Affect/Mood: N/A   Participation Level: Did not attend    Clinical Observations/Individualized Feedback:      Plan: Continue to engage patient in RT group sessions 2-3x/week.   Marieann Zipp-McCall, LRT,CTRS 03/29/2023 2:11 PM

## 2023-03-29 NOTE — Progress Notes (Signed)
Pt complaining of GI problems after taking prednisone.  Pt offered various foods but refused.  He is askinig to go to [facility].  Doris NP made aware. Pt is currently laying in his bed.   Denies N/V/D at this time  also denies abd cramping.  Pt did ask for a piece of bread and was given that along with Gatorade.  Will cont to monitor
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
## 2023-03-29 NOTE — Group Note (Signed)
Date:  03/29/2023 Time:  4:36 PM  Group Topic/Focus:  Goals Group:   The focus of this group is to help patients establish daily goals to achieve during treatment and discuss how the patient can incorporate goal setting into their daily lives to aide in recovery. Orientation:   The focus of this group is to educate the patient on the purpose and policies of crisis stabilization and provide a format to answer questions about their admission.  The group details unit policies and expectations of patients while admitted.    Participation Level:  Did Not Attend  Participation Quality:   n/a  Affect:   n/a  Cognitive:   n/a  Insight: None  Engagement in Group:   n/a  Modes of Intervention:   n/a  Additional Comments:   Pt did not attend.   Edmund Hilda Deston Bilyeu 03/29/2023, 4:36 PM

## 2023-03-29 NOTE — Progress Notes (Signed)
Pt continues to deny N/V/D.  Very guarded around staff.  Cont to ask for Benzos.  Pt was informed that he has vistaril for anxiety.  Will cont to monitor for safety.

## 2023-03-29 NOTE — Progress Notes (Incomplete)
58 year old Caucasian male, single, on SSI disability, lives with his aged parents.  Background history of multiple medical comorbidities and substance use disorder.  Presented with subjective anxiety and seeking clonazepam.  UDS positive for prescribed opiates.  Very somatic and dramatic on the unit.  Refusing use of any other medication to manage his anxiety as he is keen on getting benzodiazepines.  Mirtazapine was discussed with him, advantage of 5-HT antagonist effects was explored with him.  Patient refused this offer and eager to seek out medical help somewhere else. There is no dangerousness.  We will get collateral from family in order to make sure we are not missing out anything.  Hopeful discharge tomorrow if there are no red flags.

## 2023-03-29 NOTE — BHH Suicide Risk Assessment (Signed)
Suicide Risk Assessment  Admission Assessment    [facility] Admission Suicide Risk Assessment   Nursing information obtained from:  Patient Demographic factors:  Male, Unemployed, Caucasian, Low socioeconomic status Current Mental Status:  Self-harm thoughts Loss Factors:  Decline in physical health Historical Factors:  Prior suicide attempts Risk Reduction Factors:  Living with another person, especially a relative, Sense of responsibility to family  Total Time spent with patient: 1.5 hours Principal Problem: MDD (major depressive disorder), recurrent severe, without psychosis ([name]) Diagnosis:  Principal Problem:   MDD (major depressive disorder), recurrent severe, without psychosis ([name]) Active Problems:   Insomnia   GAD (generalized anxiety disorder)   Crohn's disease ([name])  Subjective Data: Anxiety and depressive symptoms   Continued Clinical Symptoms: depressed mood, anhedonia, insomnia, psychomotor retardation, fatigue, feelings of worthlessness/guilt, difficulty concentrating,  Alcohol Use Disorder Identification Test Final Score (AUDIT): 0 The "Alcohol Use Disorders Identification Test", Guidelines for Use in Primary Care, Second Edition.  World Science writer [facility]). Score between 0-7:  no or low risk or alcohol related problems. Score between 8-15:  moderate risk of alcohol related problems. Score between 16-19:  high risk of alcohol related problems. Score 20 or above:  warrants further diagnostic evaluation for alcohol dependence and treatment.  CLINICAL FACTORS:   Previous Psychiatric Diagnoses and Treatments  Musculoskeletal: Strength & Muscle Tone: within normal limits Gait & Station: normal Patient leans: N/A  Psychiatric Specialty Exam:  Presentation  General Appearance:  Fairly Groomed  Eye Contact: Fair  Speech: Clear and Coherent  Speech Volume: Normal  Handedness: Right   Mood and Affect  Mood: Depressed;  Anxious  Affect: Congruent   Thought Process  Thought Processes: Coherent  Descriptions of Associations:Intact  Orientation:Full (Time, Place and Person)  Thought Content:Logical  History of Schizophrenia/Schizoaffective disorder:No data recorded Duration of Psychotic Symptoms:No data recorded Hallucinations:Hallucinations: None  Ideas of Reference:None  Suicidal Thoughts:Suicidal Thoughts: No  Homicidal Thoughts:Homicidal Thoughts: No  Sensorium  Memory: Recent Fair  Judgment: Poor  Insight: Poor   Executive Functions  Concentration: Poor  Attention Span: Fair  Recall: Fair  Fund of Knowledge: Fair  Language:No data recorded  Psychomotor Activity  Psychomotor Activity: Psychomotor Activity: Normal   Assets  Assets: Resilience; Social Support   Sleep  Sleep: Sleep: Fair  Physical Exam: Physical Exam Constitutional:      Appearance: Normal appearance.  Pulmonary:     Effort: Pulmonary effort is normal.  Musculoskeletal:        General: Normal range of motion.     Cervical back: Normal range of motion.  Neurological:     Mental Status: He is alert and oriented to person, place, and time.    Review of Systems  Psychiatric/Behavioral:  Positive for depression and substance abuse. Negative for hallucinations, memory loss and suicidal ideas. The patient is nervous/anxious and has insomnia.   All other systems reviewed and are negative.  Blood pressure 120/80, pulse 90, temperature (!) 97.5 F (36.4 C), temperature source Oral, resp. rate 12, height 6\' 1"  (1.854 m), weight 68.5 kg, SpO2 97%. Body mass index is 19.92 kg/m.   COGNITIVE FEATURES THAT CONTRIBUTE TO RISK:  None    SUICIDE RISK:   Mild:  Suicidal ideation of limited frequency, intensity, duration, and specificity.  There are no identifiable plans, no associated intent, mild dysphoria and related symptoms, good self-control (both objective and subjective assessment), few  other risk factors, and identifiable protective factors, including available and accessible social support.  PLAN OF CARE: See  H & P  I certify that inpatient services furnished can reasonably be expected to improve the patient's condition.   Starleen Blue, NP 03/29/2023, 7:01 PM

## 2023-03-30 ENCOUNTER — Encounter (HOSPITAL_COMMUNITY): Payer: Self-pay

## 2023-03-30 DIAGNOSIS — F332 Major depressive disorder, recurrent severe without psychotic features: Secondary | ICD-10-CM

## 2023-03-30 MED ORDER — MIRTAZAPINE 15 MG PO TABS
15.0000 mg | ORAL_TABLET | Freq: Every day | ORAL | Status: DC
  Administered 2023-03-30 – 2023-03-31 (×2): 15 mg via ORAL
  Filled 2023-03-30 (×4): qty 1

## 2023-03-30 NOTE — Group Note (Signed)
Date:  03/30/2023 Time:  4:32 PM  Group Topic/Focus:  Dimensions of Wellness:   The focus of this group is to introduce the topic of wellness and discuss the role each dimension of wellness plays in total health.    Participation Level:  Did Not Attend  Participation Quality:   n/a  Affect:   n/a  Cognitive:   n/a  Insight: None  Engagement in Group:   n/a  Modes of Intervention:   n/a  Additional Comments:   Pt did not attend.  Edmund Hilda Klaira Pesci 03/30/2023, 4:32 PM

## 2023-03-30 NOTE — Plan of Care (Signed)
  Problem: Education: Goal: Knowledge of [facility] Education information/materials will improve Outcome: Progressing Goal: Mental status will improve Outcome: Progressing Goal: Verbalization of understanding the information provided will improve Outcome: Progressing   Problem: Coping: Goal: Ability to verbalize frustrations and anger appropriately will improve Outcome: Progressing Goal: Ability to demonstrate self-control will improve Outcome: Progressing   Problem: Safety: Goal: Periods of time without injury will increase Outcome: Progressing   Problem: Education: Goal: Emotional status will improve Outcome: Not Progressing   Problem: Activity: Goal: Interest or engagement in activities will improve Outcome: Not Progressing

## 2023-03-30 NOTE — Care Management Important Message (Signed)
Medicare IM printed and given to social work to give to the patient. ?

## 2023-03-30 NOTE — Progress Notes (Signed)
   03/29/23 2120  Psych Admission Type (Psych Patients Only)  Admission Status Voluntary  Psychosocial Assessment  Patient Complaints Anxiety;Depression;Worrying  Eye Contact Fair  Facial Expression Sad  Affect Depressed  Speech Soft  Interaction Cautious  Motor Activity Slow  Appearance/Hygiene In scrubs  Behavior Characteristics Cooperative  Mood Anxious;Preoccupied  Thought Process  Coherency WDL  Content Preoccupation  Delusions Somatic  Perception WDL  Hallucination None reported or observed  Judgment Impaired  Confusion None  Danger to Self  Current suicidal ideation? Denies  Agreement Not to Harm Self Yes  Description of Agreement verbal  Danger to Others  Danger to Others None reported or observed

## 2023-03-30 NOTE — Group Note (Signed)
Date:  03/30/2023 Time:  10:12 PM  Group Topic/Focus:  Narcotics Anonymous (NA) Meeting    Participation Level:  Did Not Attend  Participation Quality:   N/A  Affect:   N/A  Cognitive:   N/A  Insight: None  Engagement in Group:   N/A  Modes of Intervention:   N/A  Additional Comments:  Patient did not attend NA or wrap up group.   Kennieth Francois 03/30/2023, 10:12 PM

## 2023-03-30 NOTE — Progress Notes (Signed)
   03/30/23 2200  Psych Admission Type (Psych Patients Only)  Admission Status Voluntary  Psychosocial Assessment  Patient Complaints Anxiety;Depression;Helplessness;Sadness;Worrying  Eye Contact Fair  Facial Expression Flat;Sad  Affect Depressed  Speech Soft  Interaction Cautious;Minimal;No initiation  Motor Activity Slow  Appearance/Hygiene Unremarkable  Behavior Characteristics Cooperative;Appropriate to situation  Mood Depressed;Despair;Sad  Thought Process  Coherency WDL  Content Preoccupation  Delusions WDL  Perception WDL  Hallucination None reported or observed  Judgment Impaired  Confusion None  Danger to Self  Current suicidal ideation? Denies  Description of Suicide Plan None  Self-Injurious Behavior No self-injurious ideation or behavior indicators observed or expressed   Agreement Not to Harm Self Yes  Description of Agreement Verbal Contract for Safety  Danger to Others  Danger to Others None reported or observed

## 2023-03-30 NOTE — Group Note (Signed)
Date:  03/30/2023 Time:  12:00 PM  Group Topic/Focus:  Goals Group:   The focus of this group is to help patients establish daily goals to achieve during treatment and discuss how the patient can incorporate goal setting into their daily lives to aide in recovery. Orientation:   The focus of this group is to educate the patient on the purpose and policies of crisis stabilization and provide a format to answer questions about their admission.  The group details unit policies and expectations of patients while admitted.    Participation Level:  Did Not Attend  Participation Quality:   n/a  Affect:   n/a  Cognitive:   n/a  Insight: None  Engagement in Group:   n/a  Modes of Intervention:   n/a  Additional Comments:   Pt did not attend.  Edmund Hilda Delbra Zellars 03/30/2023, 12:00 PM

## 2023-03-30 NOTE — Plan of Care (Signed)
Problem: Activity: Goal: Interest or engagement in activities will improve Outcome: Progressing

## 2023-03-30 NOTE — [facility] Suicide Risk Assessment (Signed)
[facility] INPATIENT:  Family/Significant Other Suicide Prevention Education  Suicide Prevention Education:  Education Completed; Dariel Betzer, patient's brother [phone]) has been identified by the patient as the family member who will aid the patient in the event of a mental health crisis (suicidal ideations/suicide attempt).  With written consent from the patient, the family member/significant other has been provided the following suicide prevention education, prior to the and/or following the discharge of the patient.  In speaking with Elijah Birk, he stated that brother is anxious at baseline however current presentation is excessive. Tom shared information consistent with that provided by patient regarding Klonopin use. Elijah Birk stated that he does feel patient will return to the hospital immediately upon discharge as [name] does have difficulty with emotional self-regulation. CSW made him aware that Pt is slated to discharge today.   The suicide prevention education provided includes the following: Suicide risk factors Suicide prevention and interventions National Suicide Hotline telephone number [facility] assessment telephone number [facility] Emergency Assistance 911 [facility] and/or Residential Mobile Crisis Unit telephone number  Request made of family/significant other to: Remove weapons (e.g., guns, rifles, knives), all items previously/currently identified as safety concern.   Remove drugs/medications (over-the-counter, prescriptions, illicit drugs), all items previously/currently identified as a safety concern.  The family member/significant other verbalizes understanding of the suicide prevention education information provided.  The family member/significant other agrees to remove the items of safety concern listed above.  Jacinta Shoe, LCSW 03/30/2023, 9:54 AM

## 2023-03-30 NOTE — BH IP Treatment Plan (Signed)
Interdisciplinary Treatment and Diagnostic Plan Update  03/30/2023 Time of Session: 1400 QUOC TOME MRN: [id number]  Principal Diagnosis: MDD (major depressive disorder), recurrent severe, without psychosis ([name])  Secondary Diagnoses: Principal Problem:   MDD (major depressive disorder), recurrent severe, without psychosis ([name]) Active Problems:   Insomnia   GAD (generalized anxiety disorder)   Crohn's disease ([name])   Current Medications:  Current Facility-Administered Medications  Medication Dose Route Frequency Provider Last Rate Last Admin   alum & mag hydroxide-simeth (MAALOX/MYLANTA) 200-200-20 MG/5ML suspension 30 mL  30 mL Oral Q4H PRN Onuoha, Chinwendu V, NP       cloNIDine (CATAPRES) tablet 0.1 mg  0.1 mg Oral QID Starleen Blue, NP   0.1 mg at 03/30/23 1243   Followed by   Melene Muller ON 04/01/2023] cloNIDine (CATAPRES) tablet 0.1 mg  0.1 mg Oral BH-qamhs Nkwenti, Doris, NP       Followed by   Melene Muller ON 04/03/2023] cloNIDine (CATAPRES) tablet 0.1 mg  0.1 mg Oral QAC breakfast Nkwenti, Doris, NP       dicyclomine (BENTYL) capsule 10 mg  10 mg Oral TID AC Nkwenti, Doris, NP   10 mg at 03/30/23 1243   haloperidol (HALDOL) tablet 5 mg  5 mg Oral TID PRN Onuoha, Chinwendu V, NP       And   diphenhydrAMINE (BENADRYL) capsule 50 mg  50 mg Oral TID PRN Onuoha, Chinwendu V, NP       haloperidol lactate (HALDOL) injection 5 mg  5 mg Intramuscular TID PRN Onuoha, Chinwendu V, NP       And   diphenhydrAMINE (BENADRYL) injection 50 mg  50 mg Intramuscular TID PRN Onuoha, Chinwendu V, NP       And   LORazepam (ATIVAN) injection 2 mg  2 mg Intramuscular TID PRN Onuoha, Chinwendu V, NP       haloperidol lactate (HALDOL) injection 10 mg  10 mg Intramuscular TID PRN Onuoha, Chinwendu V, NP       And   diphenhydrAMINE (BENADRYL) injection 50 mg  50 mg Intramuscular TID PRN Onuoha, Chinwendu V, NP       And   LORazepam (ATIVAN) injection 2 mg  2 mg Intramuscular TID PRN Onuoha, Chinwendu  V, NP       hydrOXYzine (ATARAX) tablet 25 mg  25 mg Oral TID PRN Starleen Blue, NP   25 mg at 03/29/23 2120   loperamide (IMODIUM) capsule 2-4 mg  2-4 mg Oral PRN Starleen Blue, NP       magnesium hydroxide (MILK OF MAGNESIA) suspension 30 mL  30 mL Oral Daily PRN Onuoha, Chinwendu V, NP       methocarbamol (ROBAXIN) tablet 500 mg  500 mg Oral Q8H PRN Nkwenti, Doris, NP       mirtazapine (REMERON) tablet 15 mg  15 mg Oral QHS Bennett, Christal H, NP       ondansetron (ZOFRAN-ODT) disintegrating tablet 4 mg  4 mg Oral Q6H PRN Nkwenti, Doris, NP       predniSONE (DELTASONE) tablet 30 mg  30 mg Oral Q breakfast Nkwenti, Doris, NP   30 mg at 03/30/23 0827   PTA Medications: Medications Prior to Admission  Medication Sig Dispense Refill Last Dose/Taking   Ascorbic Acid (VITAMIN C) 1000 MG tablet Take 1,000 mg by mouth daily.   Taking   predniSONE (DELTASONE) 20 MG tablet Take 1.5 tablets (30 mg total) by mouth daily with breakfast. 100 tablet 2 Taking   vedolizumab (ENTYVIO) 300 MG  injection Inject 300 mg into the vein every 6 (six) weeks. 1 each 9 Taking   clonazePAM (KLONOPIN) 0.5 MG tablet Take 0.5 mg by mouth 2 (two) times daily.       Patient Stressors: Medication change or noncompliance    Patient Strengths: Supportive family/friends   Treatment Modalities: Medication Management, Group therapy, Case management,  1 to 1 session with clinician, Psychoeducation, Recreational therapy.   Physician Treatment Plan for Primary Diagnosis: MDD (major depressive disorder), recurrent severe, without psychosis ([name]) Long Term Goal(s): Improvement in symptoms so as ready for discharge   Short Term Goals: Ability to identify changes in lifestyle to reduce recurrence of condition will improve Ability to verbalize feelings will improve Ability to disclose and discuss suicidal ideas Ability to demonstrate self-control will improve Ability to identify and develop effective coping behaviors will  improve Ability to maintain clinical measurements within normal limits will improve Compliance with prescribed medications will improve  Medication Management: Evaluate patient's response, side effects, and tolerance of medication regimen.  Therapeutic Interventions: 1 to 1 sessions, Unit Group sessions and Medication administration.  Evaluation of Outcomes: Not Progressing  Physician Treatment Plan for Secondary Diagnosis: Principal Problem:   MDD (major depressive disorder), recurrent severe, without psychosis ([name]) Active Problems:   Insomnia   GAD (generalized anxiety disorder)   Crohn's disease ([name])  Long Term Goal(s): Improvement in symptoms so as ready for discharge   Short Term Goals: Ability to identify changes in lifestyle to reduce recurrence of condition will improve Ability to verbalize feelings will improve Ability to disclose and discuss suicidal ideas Ability to demonstrate self-control will improve Ability to identify and develop effective coping behaviors will improve Ability to maintain clinical measurements within normal limits will improve Compliance with prescribed medications will improve     Medication Management: Evaluate patient's response, side effects, and tolerance of medication regimen.  Therapeutic Interventions: 1 to 1 sessions, Unit Group sessions and Medication administration.  Evaluation of Outcomes: Not Progressing   RN Treatment Plan for Primary Diagnosis: MDD (major depressive disorder), recurrent severe, without psychosis ([name]) Long Term Goal(s): Knowledge of disease and therapeutic regimen to maintain health will improve  Short Term Goals: Ability to remain free from injury will improve, Ability to demonstrate self-control, Ability to participate in decision making will improve, Ability to verbalize feelings will improve, Ability to disclose and discuss suicidal ideas, Ability to identify and develop effective coping behaviors will  improve, and Compliance with prescribed medications will improve  Medication Management: RN will administer medications as ordered by provider, will assess and evaluate patient's response and provide education to patient for prescribed medication. RN will report any adverse and/or side effects to prescribing provider.  Therapeutic Interventions: 1 on 1 counseling sessions, Psychoeducation, Medication administration, Evaluate responses to treatment, Monitor vital signs and CBGs as ordered, Perform/monitor CIWA, COWS, AIMS and Fall Risk screenings as ordered, Perform wound care treatments as ordered.  Evaluation of Outcomes: Not Progressing   LCSW Treatment Plan for Primary Diagnosis: MDD (major depressive disorder), recurrent severe, without psychosis ([name]) Long Term Goal(s): Safe transition to appropriate next level of care at discharge, Engage patient in therapeutic group addressing interpersonal concerns.  Short Term Goals: Engage patient in aftercare planning with referrals and resources, Increase social support, Increase ability to appropriately verbalize feelings, Increase emotional regulation, Facilitate acceptance of mental health diagnosis and concerns, Identify triggers associated with mental health/substance abuse issues, and Increase skills for wellness and recovery  Therapeutic Interventions: Assess for all discharge needs,  1 to 1 time with Child psychotherapist, Explore available resources and support systems, Assess for adequacy in community support network, Educate family and significant other(s) on suicide prevention, Complete Psychosocial Assessment, Interpersonal group therapy.  Evaluation of Outcomes: Not Progressing   Progress in Treatment: Attending groups: No. Participating in groups: No. Taking medication as prescribed: No. Toleration medication: No. Family/Significant other contact made: Yes, individual(s) contacted:  BrotherFeliberto Harts 336- Patient understands diagnosis:  No. and As evidenced by:  verbalized lack of insight regarding anxiety symptoms  Discussing patient identified problems/goals with staff: Yes. Medical problems stabilized or resolved: Yes. Denies suicidal/homicidal ideation: Yes. Issues/concerns per patient self-inventory: No. Other: Previously ambivalent about treatment, therefore he was not taking any medications. After meeting with CSW and Dr. Is agreeable to starting medication.  New problem(s) identified: No, Describe:  None  New Short Term/Long Term Goal(s): medication stabilization, elimination of SI thoughts, development of comprehensive mental wellness plan.    Patient Goals:  "I want to try to live"  Discharge Plan or Barriers: Patient recently admitted. CSW will continue to follow and assess for appropriate referrals and possible discharge planning.    Reason for Continuation of Hospitalization: Anxiety Depression Medication stabilization  Estimated Length of Stay:  Last 3 [place] Suicide Severity Risk Score: Flowsheet Row [facility] (Current[facility] Most recent reading at 03/28/2023 10:42 PM ED from 03/28/2023 in [facility] Emergency Department at [facility] Most recent reading at 03/28/2023  9:09 AM ED from 11/03/2022 in [facility] Emergency Department at [facility] Most recent reading at 11/03/2022 12:59 PM  C-SSRS RISK CATEGORY Low Risk Low Risk No Risk       Last PHQ 2/9 Scores:    06/06/2020   11:06 AM 03/06/2020    2:49 PM 08/28/2019    1:04 PM  Depression screen PHQ 2/9  Decreased Interest 0 0 0  Down, Depressed, Hopeless 0 1 1  PHQ - 2 Score 0 1 1  Altered sleeping 1 2 3   Tired, decreased energy 1 2 3   Change in appetite 1 1 1   Feeling bad or failure about yourself  0 0 0  Trouble concentrating 1 2 3   Moving slowly or fidgety/restless 1 0 0  Suicidal thoughts 0 0 0  PHQ-9 Score 5 8 11   Difficult doing work/chores   Somewhat difficult     Scribe for Treatment Team: Jacinta Shoe, LCSW 03/30/2023 2:20 PM

## 2023-03-30 NOTE — Group Note (Signed)
Recreation Therapy Group Note   Group Topic:Health and Wellness  Group Date: 03/30/2023 Start Time: 0930 End Time: 1000 Facilitators: Jawanna Dykman-McCall, LRT,CTRS Location: 300 Hall Dayroom   Group Topic: Wellness  Goal Area(s) Addresses:  Patient will define components of whole wellness. Patient will verbalize benefit of whole wellness.  Intervention: Music  Activity: Exercise. Patients took turns leading the group in the exercises of their choosing. The group completed three rounds of stretching and exercise. Patients were encouraged to get water, take breaks and not to over extend themselves during the course of the activity. Emphasis was placed on patients paying attention to their bodies and what exercises they are able to complete.   Education: Wellness, Building control surveyor.   Education Outcome: Acknowledges education/In group clarification offered/Needs additional education.   Affect/Mood: N/A   Participation Level: Did not attend    Clinical Observations/Individualized Feedback:      Plan: Continue to engage patient in RT group sessions 2-3x/week.   Zeeshan Korte-McCall, LRT,CTRS 03/30/2023 12:40 PM

## 2023-03-30 NOTE — Progress Notes (Signed)
   03/30/23 1000  Psych Admission Type (Psych Patients Only)  Admission Status Voluntary  Psychosocial Assessment  Patient Complaints Anxiety;Depression;Worrying  Eye Contact Fair  Facial Expression Sad  Affect Depressed  Speech Soft  Interaction Cautious  Motor Activity Slow  Appearance/Hygiene In scrubs  Behavior Characteristics Cooperative  Mood Depressed;Anxious  Thought Process  Coherency WDL  Content Preoccupation  Delusions Somatic  Perception WDL  Hallucination None reported or observed  Judgment Impaired  Confusion None  Danger to Self  Current suicidal ideation? Denies  Self-Injurious Behavior No self-injurious ideation or behavior indicators observed or expressed   Agreement Not to Harm Self Yes  Description of Agreement verbal contract

## 2023-03-30 NOTE — Progress Notes (Signed)
   03/30/23 0556  15 Minute Checks  Location Bedroom  Visual Appearance Calm  Behavior Composed  Sleep (Behavioral Health Patients Only)  Calculate sleep? (Click Yes once per 24 hr at 0600 safety check) Yes  Documented sleep last 24 hours 5.5

## 2023-03-30 NOTE — Progress Notes (Cosign Needed Addendum)
[facility] MD Progress Note  03/30/2023 10:50 PM [name]  MRN:  [id number]  HPI:  Patient is a 58 year old Caucasian male with a reported prior mental health diagnoses of MDD & GAD who presented to the [facility] [address] ER with complaints of SI with no plan & worsening anxiety.  While at the ER, patient reported "that he takes Klonopin 0.5 mg 2-3 times daily and only has 10 more pills...currently, in ED he has no SI nor plan for suicide. He denies CP, SOB, taking extra medication today." (Per ER documentation). However, in spite of denying suicidal ideation, he was transferred to this hospital on 1/27, where he was voluntarily admitted for treatment and stabilization of his mental status.     Chart Review from last 24 hours:  The patient's chart and nursing notes were reviewed. The patient's case was discussed in multidisciplinary team meeting. Vital signs within defined limits. Nursing reported patient refused routine medication and lab draw, ad requested discharge. Sleep hours last night: 5.5 hours, as documented in the nursing flow sheets. No behavioral episodes were reported at this time. PRN medication include hydroxyzine, according to the nursing record. Nursing     Information obtained during today's assessment:  During today's encounter, the patient was observed lying in bed awake.  On assessment today, he described his mood as "no good" and expressed feelings of hopelessness, stating, "I am given up" and "I cannot make anything work. I got a problem that just cannot be fixed."  He reported having Crohn's disease, which prevents him from eating and causing significant anxiety. He further reported being ill with Crohn's disease for the past 25 years.  Additionally, he requested that someone contact his gastroenterologist, Dr. Leone Payor, noting he canceled an appointment this month due to feeling unwell.  The patient reported that he was offered to leave today and someone from  here reached out to his brother, who he stated, agreed. He stated that he cannot be out driving a car and that his brother does not understand his condition.  He recounted nearly having a nervous breakdown this morning, for which he was given a "blue pill" that he found effective. He denied suicidal ideation, stating he assured his brother he would not attempt suicide again. However, he stated that he did not feel safe to discharge home today and expressed a desire to speak again with the social worker he met this morning. He denied experiencing homicidal ideation, auditory or visual hallucinations, paranoia or delusional thoughts.     According to the social worker, via secure chat, the patient is now agreeable to taking routine medications.   The case was discussed with the attending psychiatrist, V. Izediuno. We will initiate mirtazapine 15 mg at bedtime for insomnia, which may also benefit appetite stimulation.     We discussed the following medication adjustments:  -- Start mirtazapine 15 mg, oral, daily, insomnia and appetite stimulation.    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 06/15/2018   Cancer of skin of neck    Cataract    Chronic mesenteric ischemia ([name])    Chronic pain syndrome 07/09/2016   Crohn's disease of small and large intestines ([name])     followed by dr Baldo Ash gessner   Dairy product intolerance    Diarrhea, functional    Family history of adverse reaction to anesthesia    Grover's disease    transient acantholytic dermatosis   History of alcohol abuse    History of basal cell carcinoma excision    2013 left leg   History of Clostridium difficile    10/ 2014   History of multiple concussions    x6   last one Jan 2017 per pt--  no residual   History of substance abuse ([name])    quit 1997 per pt   History of suicide attempt    05-18-2012  overdose /  failure to thrive   Iron deficiency anemia due to chronic blood loss    Major depression, recurrent, chronic ([name])    Osteopenia    Personal history of adenomatous colonic polyps 12/2010, 03/2012   12/2010 - 8 mm serrated adenoma of rectum   Portal vein thrombosis 03/21/2015   right   Primary sclerosing cholangitis    ? 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   Substance abuse ([name]) 1997   Alcohol   Vitamin A deficiency 05/23/2018   Vitamin B6 deficiency 07/27/2018   Vitamin C deficiency 05/23/2018    Past Surgical History:  Procedure Laterality Date   ABDOMINAL AORTAGRAM N/A 03/29/2012   Procedure: ABDOMINAL Ronny Flurry;  Surgeon: Nada Libman, MD;  Location: [facility] CATH LAB;  Service: Cardiovascular;  Laterality: N/A;   ADENOIDECTOMY  age 44   BIOPSY  05/31/2018   Procedure: BIOPSY;  Surgeon: Rachael Fee, MD;  Location: [facility] ENDOSCOPY;  Service: Endoscopy;;   CATARACT EXTRACTION W/ INTRAOCULAR LENS  IMPLANT, BILATERAL  2009   COLONOSCOPY  2001, 05/02/2003, 01/28/11   2012: Right colon Crohn's, rectal polyp   COLONOSCOPY  03/31/2012   Procedure: COLONOSCOPY;  Surgeon: Beverley Fiedler, MD;  Location: [facility] ENDOSCOPY;  Service: Gastroenterology;  Laterality: N/A;   COLONOSCOPY WITH PROPOFOL N/A 05/31/2018   Procedure: COLONOSCOPY WITH PROPOFOL;  Surgeon: Rachael Fee, MD;  Location: [facility] ENDOSCOPY;   Service: Endoscopy;  Laterality: N/A;   ESOPHAGOGASTRODUODENOSCOPY  01/28/2011   Normal   FOOT SURGERY Right age 1   MOHS SURGERY Left 11/2013   left ankle parakerotosis    PERCUTANEOUS LIVER BIOPSY  2007 and 2008   PILONIDAL CYST EXCISION  age 74   PLACEMENT OF SETON N/A 11/06/2015   Procedure: PLACEMENT OF SETON;  Surgeon: Romie Levee, MD;  Location: [facility];  Service: General;  Laterality: N/A;   PLACEMENT OF SETON  07/2018   at [facility]   RECTAL EXAM UNDER ANESTHESIA N/A 01/18/2019   Procedure: ANAL EXAM UNDER ANESTHESIA,  INCISION AND DRAINAGE, SETON PLACEMENT;  Surgeon: Romie Levee, MD;  Location: [facility];  Service: General;  Laterality: N/A;   UPPER GASTROINTESTINAL ENDOSCOPY     Family History:  Family History  Problem Relation Age of Onset   Heart disease Father    Thyroid cancer Father    Allergies Father    Clotting disorder  Father    Breast cancer Mother    Stomach cancer Mother    Colon cancer Neg Hx    Esophageal cancer Neg Hx    Rectal cancer Neg Hx    Family Psychiatric  History: See H&P Social History:  Social History   Substance and Sexual Activity  Alcohol Use No   Alcohol/week: 0.0 standard drinks of alcohol     Social History   Substance and Sexual Activity  Drug Use Not Currently   Types: Cocaine, Marijuana   Comment: QUIT USING DRUGS IN 1997    Social History   Socioeconomic History   Marital status: Single    Spouse name: Not on file   Number of children: 1   Years of education: Not on file   Highest education level: Not on file  Occupational History   Occupation: Disability   Tobacco Use   Smoking status: Former    Current packs/day: 0.00    Average packs/day: 1 pack/day for 15.0 years (15.0 ttl pk-yrs)    Types: Cigarettes    Start date: 03/28/1982    Quit date: 03/28/1997    Years since quitting: 26.0   Smokeless tobacco: Never  Vaping Use   Vaping status: Never Used  Substance and  Sexual Activity   Alcohol use: No    Alcohol/week: 0.0 standard drinks of alcohol   Drug use: Not Currently    Types: Cocaine, Marijuana    Comment: QUIT USING DRUGS IN 1997   Sexual activity: Not Currently  Other Topics Concern   Not on file  Social History Narrative   Single, history of substance abuse in recovery   Previously occupied on medical disability   1 child  Daughter Wellsite geologist, lives and works in [place]   Elderly parents involved and he helps them   1 brother   Social Drivers of Corporate investment banker Strain: Low Risk  (08/28/2019)   Overall Financial Resource Strain (CARDIA)    Difficulty of Paying Living Expenses: Not hard at all  Food Insecurity: No Food Insecurity (03/28/2023)   Hunger Vital Sign    Worried About Running Out of Food in the Last Year: Never true    Ran Out of Food in the Last Year: Never true  Transportation Needs: No Transportation Needs (03/28/2023)   PRAPARE - Administrator, Civil Service (Medical): No    Lack of Transportation (Non-Medical): No  Physical Activity: Insufficiently Active (11/08/2019)   Exercise Vital Sign    Days of Exercise per Week: 3 days    Minutes of Exercise per Session: 30 min  Stress: Stress Concern Present (08/28/2019)   [employer] of Occupational Health - Occupational Stress Questionnaire    Feeling of Stress : Very much  Social Connections: Socially Isolated (03/28/2023)   Social Connection and Isolation Panel [NHANES]    Frequency of Communication with Friends and Family: Three times a week    Frequency of Social Gatherings with Friends and Family: Twice a week    Attends Religious Services: Never    Database administrator or Organizations: No    Attends Engineer, structural: Never    Marital Status: Divorced   Additional Social History:                          Current Medications: Current Facility-Administered Medications  Medication Dose Route Frequency Provider  Last Rate Last Admin   alum & mag hydroxide-simeth (  MAALOX/MYLANTA) 200-200-20 MG/5ML suspension 30 mL  30 mL Oral Q4H PRN Onuoha, Chinwendu V, NP       cloNIDine (CATAPRES) tablet 0.1 mg  0.1 mg Oral QID Starleen Blue, NP   0.1 mg at 03/30/23 1716   Followed by   Melene Muller ON 04/01/2023] cloNIDine (CATAPRES) tablet 0.1 mg  0.1 mg Oral BH-qamhs Nkwenti, Doris, NP       Followed by   Melene Muller ON 04/03/2023] cloNIDine (CATAPRES) tablet 0.1 mg  0.1 mg Oral QAC breakfast Nkwenti, Doris, NP       dicyclomine (BENTYL) capsule 10 mg  10 mg Oral TID AC Nkwenti, Doris, NP   10 mg at 03/30/23 1716   haloperidol (HALDOL) tablet 5 mg  5 mg Oral TID PRN Onuoha, Chinwendu V, NP       And   diphenhydrAMINE (BENADRYL) capsule 50 mg  50 mg Oral TID PRN Onuoha, Chinwendu V, NP       haloperidol lactate (HALDOL) injection 5 mg  5 mg Intramuscular TID PRN Onuoha, Chinwendu V, NP       And   diphenhydrAMINE (BENADRYL) injection 50 mg  50 mg Intramuscular TID PRN Onuoha, Chinwendu V, NP       And   LORazepam (ATIVAN) injection 2 mg  2 mg Intramuscular TID PRN Onuoha, Chinwendu V, NP       haloperidol lactate (HALDOL) injection 10 mg  10 mg Intramuscular TID PRN Onuoha, Chinwendu V, NP       And   diphenhydrAMINE (BENADRYL) injection 50 mg  50 mg Intramuscular TID PRN Onuoha, Chinwendu V, NP       And   LORazepam (ATIVAN) injection 2 mg  2 mg Intramuscular TID PRN Onuoha, Chinwendu V, NP       hydrOXYzine (ATARAX) tablet 25 mg  25 mg Oral TID PRN Starleen Blue, NP   25 mg at 03/29/23 2120   loperamide (IMODIUM) capsule 2-4 mg  2-4 mg Oral PRN Starleen Blue, NP       magnesium hydroxide (MILK OF MAGNESIA) suspension 30 mL  30 mL Oral Daily PRN Onuoha, Chinwendu V, NP       methocarbamol (ROBAXIN) tablet 500 mg  500 mg Oral Q8H PRN Nkwenti, Doris, NP       mirtazapine (REMERON) tablet 15 mg  15 mg Oral QHS Pate Aylward H, NP   15 mg at 03/30/23 2138   ondansetron (ZOFRAN-ODT) disintegrating tablet 4 mg  4 mg Oral  Q6H PRN Starleen Blue, NP       predniSONE (DELTASONE) tablet 30 mg  30 mg Oral Q breakfast Nkwenti, Doris, NP   30 mg at 03/30/23 0827    Lab Results: No results found. However, due to the size of the patient record, not all encounters were searched. Please check Results Review for a complete set of results.  Blood Alcohol level:  Lab Results  Component Value Date   ETH <10 03/28/2023   ETH <10 08/26/2018    Metabolic Disorder Labs: Lab Results  Component Value Date   HGBA1C 6.1 09/18/2014   No results found for: "PROLACTIN" Lab Results  Component Value Date   CHOL 288 (H) 02/05/2011   TRIG 76.0 02/05/2011   HDL 62.50 02/05/2011   CHOLHDL 5 02/05/2011   VLDL 15.2 02/05/2011    Physical Findings: AIMS:  , ,  ,  ,    CIWA:    COWS:  COWS Total Score: 2  Musculoskeletal: Strength & Muscle Tone: within normal  limits Gait & Station: normal Patient leans: N/A  Psychiatric Specialty Exam:  Presentation  General Appearance:  Fairly Groomed  Eye Contact: Fair  Speech: Clear and Coherent  Speech Volume: Normal  Handedness: Right   Mood and Affect  Mood: Depressed; Anxious  Affect: Congruent   Thought Process  Thought Processes: Coherent  Descriptions of Associations:Intact  Orientation:Full (Time, Place and Person)  Thought Content:Logical  History of Schizophrenia/Schizoaffective disorder:No data recorded Duration of Psychotic Symptoms:No data recorded Hallucinations:Hallucinations: None  Ideas of Reference:None  Suicidal Thoughts:Suicidal Thoughts: No  Homicidal Thoughts:Homicidal Thoughts: No   Sensorium  Memory: Recent Fair  Judgment: Poor  Insight: Poor   Executive Functions  Concentration: Poor  Attention Span: Fair  Recall: Fair  Fund of Knowledge: Fair  Language:No data recorded  Psychomotor Activity  Psychomotor Activity: Psychomotor Activity: Normal   Assets  Assets: Resilience; Social  Support   Sleep  Sleep: Sleep: Fair    Physical Exam: Physical Exam Vitals and nursing note reviewed.  Constitutional:      General: He is not in acute distress.    Appearance: He is ill-appearing.  HENT:     Head: Atraumatic.     Mouth/Throat:     Mouth: Mucous membranes are dry.  Eyes:     Conjunctiva/sclera: Conjunctivae normal.  Pulmonary:     Effort: No respiratory distress.  Musculoskeletal:        General: Normal range of motion.     Cervical back: Normal range of motion.  Skin:    General: Skin is dry.  Neurological:     General: No focal deficit present.     Mental Status: He is alert. He is disoriented.    Review of Systems  Constitutional:  Negative for chills and fever.  HENT:  Negative for congestion, hearing loss and sore throat.   Respiratory:  Negative for cough and shortness of breath.   Cardiovascular:  Negative for chest pain and palpitations.  Gastrointestinal:  Positive for abdominal pain.  Neurological:  Negative for dizziness, tremors and headaches.  Psychiatric/Behavioral:  Positive for depression and suicidal ideas. Negative for hallucinations. The patient is nervous/anxious.    Blood pressure (!) 96/59, pulse 62, temperature (!) 97.5 F (36.4 C), temperature source Oral, resp. rate 16, height 6\' 1"  (1.854 m), weight 68.5 kg, SpO2 99%. Body mass index is 19.92 kg/m.   Treatment Plan Summary: Daily contact with patient to assess and evaluate symptoms and progress in treatment and Medication management   Safety and Monitoring: Voluntary admission to inpatient psychiatric unit for safety, stabilization and treatment Daily contact with patient to assess and evaluate symptoms and progress in treatment Patient's case to be discussed in multi-disciplinary team meeting Observation Level : q15 minute checks Vital signs: q12 hours Precautions: Safety   Long Term Goal(s): Improvement in symptoms so as ready for discharge   Short Term Goals:  Ability to identify changes in lifestyle to reduce recurrence of condition will improve, Ability to verbalize feelings will improve, Ability to disclose and discuss suicidal ideas, Ability to demonstrate self-control will improve, Ability to identify and develop effective coping behaviors will improve, Ability to maintain clinical measurements within normal limits will improve, and Compliance with prescribed medications will improve   Diagnoses Principal Problem:   MDD (major depressive disorder), recurrent severe, without psychosis ([name]) Active Problems:   Insomnia   GAD (generalized anxiety disorder)   Crohn's disease ([name])   Medications - Start mirtazapine 15 mg, oral, daily at bedtime for treatment of  insomnia and appetite stimulation  - Continue Prednisone 30 mg daily for Chrohns disease (Home med) - Continue Bentyl 10 mg BID for colon spasms - Continue COWS with Clonidine for Opiates use disorder (UDS remarkable for Opiates) *See the [facility] for details*    PRNS -Continue Zofran 4 mg every 6 hours as needed nausea -Continue agitation protocol as shown on the MAR -Continue Tylenol 650 mg every 6 hours PRN for mild pain -Continue Maalox 30 mg every 4 hrs PRN for indigestion -Continue Milk of Magnesia as needed every 6 hrs for constipation   Labs reviewed: Liver enzymes are elevated, ordering repeat hepatic function panel along with hepatitis panel.   Reordering Labs for 1/31 due to refusal this morning:  CBC, CMP, lipid panel, hemoglobin A1c, vitamin D, B12.   EKG reviewed.  Discharge Planning: Social work and case management to assist with discharge planning and identification of hospital follow-up needs prior to discharge Estimated LOS: 5-7 days Discharge Concerns: Need to establish a safety plan; Medication compliance and effectiveness Discharge Goals: Return home with outpatient referrals for mental health follow-up including medication management/psychotherapy   I certify that  inpatient services furnished can reasonably be expected to improve the patient's condition.      Norma Fredrickson, NP 03/30/2023, 10:50 PM

## 2023-03-31 DIAGNOSIS — F332 Major depressive disorder, recurrent severe without psychotic features: Secondary | ICD-10-CM | POA: Diagnosis not present

## 2023-03-31 MED ORDER — HYDROXYZINE HCL 25 MG PO TABS
25.0000 mg | ORAL_TABLET | ORAL | Status: AC
Start: 2023-03-31 — End: 2023-03-31
  Administered 2023-03-31: 25 mg via ORAL
  Filled 2023-03-31 (×2): qty 1

## 2023-03-31 MED ORDER — ACETAMINOPHEN 325 MG PO TABS
650.0000 mg | ORAL_TABLET | Freq: Four times a day (QID) | ORAL | Status: DC | PRN
  Administered 2023-04-03 (×2): 650 mg via ORAL
  Filled 2023-03-31 (×3): qty 2

## 2023-03-31 NOTE — Progress Notes (Signed)
Patient ID: [name], male   DOB: 1965/10/06, 58 y.o.   MRN: [id number] Met with Pt on the unit to discuss discharge planning. Pt lying in bed awake and remains minimally engaged on the unit. Pt is aware of planned discharge, Medicare IM given. Made him aware that discharge will be tomorrow. He stated that he will reach out to brother this afternoon to make him aware, as he will require transportation at discharge. Dicussed follow-up to include services arranged with [facility]. ROI complete. CSW will continue to follow.   Mckinley Jewel, LCSW 03/31/23

## 2023-03-31 NOTE — Plan of Care (Signed)
Problem: Education: Goal: Knowledge of [name] [facility] Education information/materials will improve Outcome: Progressing Goal: Mental status will improve Outcome: Progressing Goal: Verbalization of understanding the information provided will improve Outcome: Progressing   Problem: Coping: Goal: Ability to demonstrate self-control will improve Outcome: Progressing   Problem: Safety: Goal: Periods of time without injury will increase Outcome: Progressing

## 2023-03-31 NOTE — Progress Notes (Signed)
[facility] MD Progress Note   Patient Identification: KELLEN DUTCH MRN:  [id number] Date of Evaluation:  03/31/2023 Chief Complaint:  Anxiety and depression [F41.9, F32.A] Principal Diagnosis: MDD (major depressive disorder), recurrent severe, without psychosis ([name]) Diagnosis:  Principal Problem:   MDD (major depressive disorder), recurrent severe, without psychosis ([name]) Active Problems:   Insomnia   GAD (generalized anxiety disorder)   Crohn's disease ([name])  Reason for admission: Patient is a 58 year old Caucasian male with a reported prior mental health diagnoses of MDD & GAD who presented to the [facility] [address] ER with complaints of SI with no plan & worsening anxiety.    24 hr chart Review: Moderate complaints reported by nursing staff, where patient verbalizes not being able to walk, but is observed walking in the hallway, and opening his room walking around.  Patient has not attended any unit group sessions since hospitalization.  Vital signs are within normal limits, sleep hours last night with documented as 4.75 hours.  Patient assessment note: Pt continues to present with a flat affect and depressed mood, attention to personal hygiene and grooming is poor, eye contact is minimal during this encounter, and patient speaks to writer with eyes closed through entire assessment. Speech is clear & coherent. Thought contents are organized and logical, & pt  denies SI/HI/AVH or paranoia. There is no evidence of delusional thoughts.   Patient continues to be persistent about wanting to be transferred to the [facility] ED. He reports that he needs more of a medical attention that is not being given to him at this hospital, and is not receptive to the fact that there is currently no indication for him to be transferred on to medical unit. Pt asked to be discharged today, states that he will have his brother transport him to the medical hospital.   However, it is necessary to keep  patient hospitalized until at least tomorrow 1/31, because yesterday was day #1 that he started taking the Remeron.  It is worth noting that patient had refused this medication on admission, and had a change of mind decided to start taking it yesterday.  We require more time to monitor him on this medication in order to make adjustments as necessary.  However, if he insists on getting discharged, we will keep him hospitalized until at least tomorrow.  Our initial projected discharge date for patient was Saturday 2/1.   Labs Reviewed: All labs remain pending, as patient has been refusing lab draws as per staff reports.  [place] Scale:  Flowsheet Row Ad[facility] (Current) [facility] Most recent reading at 03/28/2023 10:42 PM ED from 03/28/2023 in [facility] Emergency Department at [facility] Most recent reading at 03/28/2023  9:09 AM ED from 11/03/2022 in [facility] Emergency Department at [facility] Most recent reading at 11/03/2022 12:59 PM  C-SSRS RISK CATEGORY Low Risk Low Risk No Risk       Alcohol Screening: 1. How often do you have a drink containing alcohol?: Never 2. How many drinks containing alcohol do you have on a typical day when you are drinking?: 1 or 2 3. How often do you have six or more drinks on one occasion?: Never AUDIT-C Score: 0 4. How often during the last year have you found that you were not able to stop drinking once you had started?: Never 5. How often during the last year have you failed to do what was normally expected from you because  of drinking?: Never 6. How often during the last year have you needed a first drink in the morning to get yourself going after a heavy drinking session?: Never 7. How often during the last year have you had a feeling of guilt of remorse after drinking?: Never 8. How often during the last year have you been unable to remember what happened the night before because you had  been drinking?: Never 9. Have you or someone else been injured as a result of your drinking?: No 10. Has a relative or friend or a doctor or another health worker been concerned about your drinking or suggested you cut down?: No Alcohol Use Disorder Identification Test Final Score (AUDIT): 0 Alcohol Brief Interventions/Follow-up: Alcohol education/Brief advice Substance Abuse History in the last 12 months:  No. 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 06/15/2018   Cancer of skin of neck    Cataract    Chronic mesenteric ischemia ([name])    Chronic pain syndrome 07/09/2016   Crohn's disease of small and large intestines ([name])    followed by dr Baldo Ash gessner   Dairy product intolerance    Diarrhea, functional    Family history of adverse reaction to anesthesia    Grover's disease    transient acantholytic dermatosis   History of alcohol abuse    History of basal cell carcinoma excision    2013 left leg   History of Clostridium difficile    10/ 2014   History of multiple concussions    x6   last one Jan 2017 per pt--  no residual   History of substance abuse ([name])    quit 1997 per pt   History of suicide attempt    05-18-2012  overdose /  failure to thrive   Iron deficiency anemia due to chronic blood loss    Major depression, recurrent, chronic ([name])    Osteopenia    Personal history of adenomatous colonic polyps 12/2010, 03/2012   12/2010 - 8 mm serrated adenoma of rectum   Portal vein thrombosis 03/21/2015   right   Primary sclerosing cholangitis    ? hepatitis overlap - liver bx x 2 and MRCP   Seasonal allergies     Steroid-induced diabetes ([name]) 03/15/2022   Substance abuse ([name]) 1997   Alcohol   Vitamin A deficiency 05/23/2018   Vitamin B6 deficiency 07/27/2018   Vitamin C deficiency 05/23/2018    Past Surgical History:  Procedure Laterality Date   ABDOMINAL AORTAGRAM N/A 03/29/2012   Procedure: ABDOMINAL Ronny Flurry;  Surgeon: Nada Libman, MD;  Location: [facility] CATH LAB;  Service: Cardiovascular;  Laterality: N/A;   ADENOIDECTOMY  age 57   BIOPSY  05/31/2018   Procedure: BIOPSY;  Surgeon: Rachael Fee, MD;  Location: [facility] ENDOSCOPY;  Service: Endoscopy;;   CATARACT EXTRACTION W/ INTRAOCULAR LENS  IMPLANT, BILATERAL  2009   COLONOSCOPY  2001, 05/02/2003, 01/28/11   2012: Right colon Crohn's, rectal polyp   COLONOSCOPY  03/31/2012   Procedure: COLONOSCOPY;  Surgeon: Beverley Fiedler, MD;  Location: [facility] ENDOSCOPY;  Service: Gastroenterology;  Laterality: N/A;   COLONOSCOPY  WITH PROPOFOL N/A 05/31/2018   Procedure: COLONOSCOPY WITH PROPOFOL;  Surgeon: Rachael Fee, MD;  Location: [facility] ENDOSCOPY;  Service: Endoscopy;  Laterality: N/A;   ESOPHAGOGASTRODUODENOSCOPY  01/28/2011   Normal   FOOT SURGERY Right age 4   MOHS SURGERY Left 11/2013   left ankle parakerotosis    PERCUTANEOUS LIVER BIOPSY  2007 and 2008   PILONIDAL CYST EXCISION  age 21   PLACEMENT OF SETON N/A 11/06/2015   Procedure: PLACEMENT OF SETON;  Surgeon: Romie Levee, MD;  Location: [facility];  Service: General;  Laterality: N/A;   PLACEMENT OF SETON  07/2018   at [facility]   RECTAL EXAM UNDER ANESTHESIA N/A 01/18/2019   Procedure: ANAL EXAM UNDER ANESTHESIA,  INCISION AND DRAINAGE, SETON PLACEMENT;  Surgeon: Romie Levee, MD;  Location: [facility] [place];  Service: General;  Laterality: N/A;   UPPER GASTROINTESTINAL ENDOSCOPY     Family History:  Family History  Problem Relation Age of Onset   Heart disease Father    Thyroid cancer Father    Allergies Father    Clotting disorder Father    Breast  cancer Mother    Stomach cancer Mother    Colon cancer Neg Hx    Esophageal cancer Neg Hx    Rectal cancer Neg Hx    Family Psychiatric  History: see aove Tobacco Screening:  Social History   Tobacco Use  Smoking Status Former   Current packs/day: 0.00   Average packs/day: 1 pack/day for 15.0 years (15.0 ttl pk-yrs)   Types: Cigarettes   Start date: 03/28/1982   Quit date: 03/28/1997   Years since quitting: 26.0  Smokeless Tobacco Never    [facility] Tobacco Counseling     Are you interested in Tobacco Cessation Medications?  No value filed. Counseled patient on smoking cessation:  No value filed. Reason Tobacco Screening Not Completed: No value filed.       Social History:  Social History   Substance and Sexual Activity  Alcohol Use No   Alcohol/week: 0.0 standard drinks of alcohol     Social History   Substance and Sexual Activity  Drug Use Not Currently   Types: Cocaine, Marijuana   Comment: QUIT USING DRUGS IN 1997  Allergies:   Allergies  Allergen Reactions   Asacol [Mesalamine] Diarrhea    abd pain   Azathioprine Diarrhea    abd pain   Gluten Meal Diarrhea   Metoprolol Tartrate Nausea Only    Stomach upset   Mycophenolate Mofetil Diarrhea    abd pain   Other Other (See Comments)    NUTS; constipation   Wheat Diarrhea    Constipation; flatulence; abd pain   Amoxicillin Other (See Comments)    Lots of gas   Tape Itching and Rash    Please use "paper" tape   Tylenol [Acetaminophen] Other (See Comments)   Lab Results:  No results found. However, due to the size of the patient record, not all encounters were searched. Please check Results Review for a complete set of results.   Blood Alcohol level:  Lab Results  Component Value Date   ETH <10 03/28/2023   ETH <10 08/26/2018    Metabolic Disorder Labs:  Lab Results  Component Value Date   HGBA1C 6.1 09/18/2014   No results found for: "PROLACTIN" Lab Results  Component Value Date   CHOL 288 (H)  02/05/2011   TRIG 76.0 02/05/2011   HDL 62.50 02/05/2011   CHOLHDL 5 02/05/2011  VLDL 15.2 02/05/2011    Current Medications: Current Facility-Administered Medications  Medication Dose Route Frequency Provider Last Rate Last Admin   alum & mag hydroxide-simeth (MAALOX/MYLANTA) 200-200-20 MG/5ML suspension 30 mL  30 mL Oral Q4H PRN Onuoha, Chinwendu V, NP       cloNIDine (CATAPRES) tablet 0.1 mg  0.1 mg Oral QID Starleen Blue, NP   0.1 mg at 03/31/23 1138   Followed by   Melene Muller ON 04/01/2023] cloNIDine (CATAPRES) tablet 0.1 mg  0.1 mg Oral BH-qamhs Silver Achey, NP       Followed by   Melene Muller ON 04/03/2023] cloNIDine (CATAPRES) tablet 0.1 mg  0.1 mg Oral QAC breakfast Raequon Catanzaro, NP       dicyclomine (BENTYL) capsule 10 mg  10 mg Oral TID AC Takiah Maiden, NP   10 mg at 03/31/23 1139   haloperidol (HALDOL) tablet 5 mg  5 mg Oral TID PRN Onuoha, Chinwendu V, NP   5 mg at 03/31/23 1610   And   diphenhydrAMINE (BENADRYL) capsule 50 mg  50 mg Oral TID PRN Onuoha, Chinwendu V, NP   50 mg at 03/31/23 0252   haloperidol lactate (HALDOL) injection 5 mg  5 mg Intramuscular TID PRN Onuoha, Chinwendu V, NP       And   diphenhydrAMINE (BENADRYL) injection 50 mg  50 mg Intramuscular TID PRN Onuoha, Chinwendu V, NP       And   LORazepam (ATIVAN) injection 2 mg  2 mg Intramuscular TID PRN Onuoha, Chinwendu V, NP       haloperidol lactate (HALDOL) injection 10 mg  10 mg Intramuscular TID PRN Onuoha, Chinwendu V, NP       And   diphenhydrAMINE (BENADRYL) injection 50 mg  50 mg Intramuscular TID PRN Onuoha, Chinwendu V, NP       And   LORazepam (ATIVAN) injection 2 mg  2 mg Intramuscular TID PRN Onuoha, Chinwendu V, NP       hydrOXYzine (ATARAX) tablet 25 mg  25 mg Oral TID PRN Starleen Blue, NP   25 mg at 03/31/23 1139   loperamide (IMODIUM) capsule 2-4 mg  2-4 mg Oral PRN Starleen Blue, NP       magnesium hydroxide (MILK OF MAGNESIA) suspension 30 mL  30 mL Oral Daily PRN Onuoha, Chinwendu V,  NP       methocarbamol (ROBAXIN) tablet 500 mg  500 mg Oral Q8H PRN Zymeir Salminen, NP       mirtazapine (REMERON) tablet 15 mg  15 mg Oral QHS Bennett, Christal H, NP   15 mg at 03/30/23 2138   ondansetron (ZOFRAN-ODT) disintegrating tablet 4 mg  4 mg Oral Q6H PRN Starleen Blue, NP       predniSONE (DELTASONE) tablet 30 mg  30 mg Oral Q breakfast Destin Kittler, NP   30 mg at 03/31/23 9604   PTA Medications: Medications Prior to Admission  Medication Sig Dispense Refill Last Dose/Taking   Ascorbic Acid (VITAMIN C) 1000 MG tablet Take 1,000 mg by mouth daily.   Taking   predniSONE (DELTASONE) 20 MG tablet Take 1.5 tablets (30 mg total) by mouth daily with breakfast. 100 tablet 2 Taking   vedolizumab (ENTYVIO) 300 MG injection Inject 300 mg into the vein every 6 (six) weeks. 1 each 9 Taking   clonazePAM (KLONOPIN) 0.5 MG tablet Take 0.5 mg by mouth 2 (two) times daily.       Musculoskeletal: Strength & Muscle Tone: within normal limits Gait & Station: normal  Patient leans: N/A  Psychiatric Specialty Exam:  Presentation  General Appearance: Disheveled  Eye Contact:Minimal  Speech:Clear and Coherent  Speech Volume:Decreased  Handedness:Right   Mood and Affect  Mood:Depressed  Affect:Congruent   Thought Process  Thought Processes:Coherent  Duration of Psychotic Symptoms: >2 weeks Past Diagnosis of Schizophrenia or Psychoactive disorder: No data recorded Descriptions of Associations:Intact  Orientation:Full (Time, Place and Person)  Thought Content:Logical  Hallucinations:Hallucinations: None   Ideas of Reference:None  Suicidal Thoughts:Suicidal Thoughts: No   Homicidal Thoughts:Homicidal Thoughts: No    Sensorium  Memory:Immediate Fair  Judgment:Fair  Insight:Fair   Executive Functions  Concentration:Fair  Attention Span:Fair  Recall:Fair  Fund of Knowledge:Fair  Language:Fair   Psychomotor Activity  Psychomotor Activity:Psychomotor  Activity: Normal    Assets  Assets:Resilience   Sleep  Sleep:Sleep: Fair     Physical Exam: Physical Exam Constitutional:      Appearance: Normal appearance.  Musculoskeletal:        General: Normal range of motion.     Cervical back: Normal range of motion.  Neurological:     General: No focal deficit present.     Mental Status: He is alert and oriented to person, place, and time.    Review of Systems  Constitutional: Negative.   Psychiatric/Behavioral:  Positive for depression and substance abuse. Negative for hallucinations, memory loss and suicidal ideas. The patient is nervous/anxious and has insomnia.   All other systems reviewed and are negative.  Blood pressure 112/73, pulse 82, temperature (!) 97.5 F (36.4 C), temperature source Oral, resp. rate 18, height 6\' 1"  (1.854 m), weight 68.5 kg, SpO2 100%. Body mass index is 19.92 kg/m.  Treatment Plan Summary: Daily contact with patient to assess and evaluate symptoms and progress in treatment and Medication management  Safety and Monitoring: Voluntary admission to inpatient psychiatric unit for safety, stabilization and treatment Daily contact with patient to assess and evaluate symptoms and progress in treatment Patient's case to be discussed in multi-disciplinary team meeting Observation Level : q15 minute checks Vital signs: q12 hours Precautions: Safety  Long Term Goal(s): Improvement in symptoms so as ready for discharge  Short Term Goals: Ability to identify changes in lifestyle to reduce recurrence of condition will improve, Ability to verbalize feelings will improve, Ability to disclose and discuss suicidal ideas, Ability to demonstrate self-control will improve, Ability to identify and develop effective coping behaviors will improve, Ability to maintain clinical measurements within normal limits will improve, and Compliance with prescribed medications will improve  Diagnoses Principal Problem:   MDD  (major depressive disorder), recurrent severe, without psychosis ([name]) Active Problems:   Insomnia   GAD (generalized anxiety disorder)   Crohn's disease ([name])  Medications -Continue Remeron 15 mg for treatment of insomnia & depressive symptoms -Continue Prednisone 30 mg daily for Chrohns disease (Home med) -Continue Bentyl 10 mg BID for stomach spasms -Continue CoWS with Clonidine for Opiates use d/o-UDS +for Opiates See the [facility] for details   PRNS -Continue Zofran 4 mg every 6 hours as needed nausea -Continue agitation protocol as shown on the MAR (Ativan/Haldol, Ativan TID PRN) -Continue Robaxin 500 mg Q 8 H for muscle cramps -Continue Hydroxyzine 25 mg TID PRN for anxiety -Continue Tylenol 650 mg every 6 hours PRN for mild pain -Continue Maalox 30 mg every 4 hrs PRN for indigestion -Continue Milk of Magnesia as needed every 6 hrs for constipation  Labs reviewed: Liver enzymes are elevated, ordered repeat hepatic function panel along with hepatitis panel.  Ordered CBC,  CMP, lipid panel, hemoglobin A1c, vitamin D, B12 on admission, and is pending due to refusals  EKG reviewed.  Discharge Planning: Social work and case management to assist with discharge planning and identification of hospital follow-up needs prior to discharge Estimated LOS: 5-7 days Discharge Concerns: Need to establish a safety plan; Medication compliance and effectiveness Discharge Goals: Return home with outpatient referrals for mental health follow-up including medication management/psychotherapy  I certify that inpatient services furnished can reasonably be expected to improve the patient's condition.    Starleen Blue, NP 1/30/20253:03 PM  Patient ID: [name], male   DOB: 12/08/1965, 58 y.o.   MRN: [id number]

## 2023-03-31 NOTE — Group Note (Signed)
Date:  03/31/2023 Time:  9:20 PM  Group Topic/Focus:  Wrap-Up Group:   The focus of this group is to help patients review their daily goal of treatment and discuss progress on daily workbooks.    Participation Level:  Did Not Attend  Participation Quality:   n/a  Affect:   n/a  Cognitive:   n/a  Insight: None  Engagement in Group:   n/a  Modes of Intervention:   n/a  Additional Comments:  Patient did not attend wrap up group.  [name] 03/31/2023, 9:20 PM

## 2023-03-31 NOTE — Progress Notes (Signed)
   03/31/23 1535  Important Message  Medicare important message given? Yes - Medicare IM   Patient informed of right to appeal discharge, provided phone number to [facility]. Patient expressed no interest in appealing discharge at this time. CSW will continue to monitor situation.  Mckinley Jewel, LCSW 03/31/23

## 2023-03-31 NOTE — Progress Notes (Signed)
Patient with increased anxiety, stating he feels hopeless and helpless. He appears severely depressed. Patient has a flat affect and pacing slowly/cautiously. He denies SI/HI/AVH. Patient states he does not feel ready to go home today. 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 He states he was previously on hospice and continues to have intrusive thoughts of his declining health. Writer informed patient that she will inform oncoming staff about his concerns to be relayed to provider/Tx Team. Writer gave patient active listening, and support in relation to his concerns. Will continue to monitor.

## 2023-03-31 NOTE — [facility] Group Notes (Signed)
Patient did not attend the Emotional Wellness group.

## 2023-03-31 NOTE — Progress Notes (Signed)
   03/31/23 0900  Psych Admission Type (Psych Patients Only)  Admission Status Voluntary  Psychosocial Assessment  Patient Complaints Anxiety;Depression;Worrying  Eye Contact Fair  Facial Expression Flat;Sad  Affect Depressed  Speech Soft  Interaction Cautious  Motor Activity Slow  Appearance/Hygiene Unremarkable  Behavior Characteristics Cooperative  Mood Depressed;Anxious  Thought Process  Coherency WDL  Content Preoccupation  Delusions WDL  Perception WDL  Hallucination None reported or observed  Judgment Impaired  Confusion None  Danger to Self  Current suicidal ideation? Denies  Self-Injurious Behavior No self-injurious ideation or behavior indicators observed or expressed   Agreement Not to Harm Self Yes  Description of Agreement verbal  Danger to Others  Danger to Others None reported or observed

## 2023-03-31 NOTE — Group Note (Signed)
LCSW Group Therapy Note   Group Date: 03/31/2023 Start Time: 1100 End Time: 1200   Type of Therapy:  Group Therapy  Topic:  Understanding Your Path to Change  Participation:  did not attend  Objective:  The goal is to help participants understand the stages of change, identify where they currently are in the process, and provide actionable next steps to continue moving forward in their journey of change.  Goals: Learn about the six stages of change:  Precontemplation, Contemplation, Preparation, Action, Maintenance, and Relapse Reflect on Current Change Efforts:  Recognize which stage participants are in regarding to a personal change. Plan Next Steps for Moving Forward:  Create an action plan based on their current stage of change.  Class Summary: In this session, we explored the Stages of Change as a framework to understand the process of change.  We discussed how each stage helps individuals recognize where they are in their personal journey and used the Stages of Change Worksheet for self-reflection.  Participants answered questions to better understand their current stage, challenges, and progress. We also emphasized the importance of moving forward, even if setbacks (Relapse) occur, and created actionable steps to help participants continue progressing. By the end of the session, participants gained a clearer understanding of their path to change and left with a clear plan for next steps.  Therapeutic Modalities:  Elements of CBT (cognitive restructuring, problem solving)  Element of DBT (mindfulness, distress tolerance)  Alfredia Ferguson Kaziyah Parkison, LCSWA 03/31/2023  6:34 PM

## 2023-03-31 NOTE — Progress Notes (Signed)
Patient presented to desk, with increased agitation/anxiety and irritability. He is visibly shaking. Patient is tearful. Patient states he is having overwhelming thoughts of going home on today and facing all the things he has to take care of, including his elderly parents. He is mildly agitated. Writer educated patient and encouraged deep breathing techniques. In addition, Clinical research associate encouraged guided imagery/diversion. Patient also given medication for his presenting problems. See eMAR.

## 2023-04-01 DIAGNOSIS — F332 Major depressive disorder, recurrent severe without psychotic features: Secondary | ICD-10-CM | POA: Diagnosis not present

## 2023-04-01 MED ORDER — MIRTAZAPINE 30 MG PO TABS
30.0000 mg | ORAL_TABLET | Freq: Every day | ORAL | Status: DC
  Administered 2023-04-01 – 2023-04-03 (×3): 30 mg via ORAL
  Filled 2023-04-01 (×5): qty 1

## 2023-04-01 NOTE — Progress Notes (Signed)
[facility] MD Progress Note  04/01/2023 1:29 PM [name]  MRN:  [id number] Subjective:   58 year old Caucasian male, single, on SSI disability, lives with his aged parents. Background history of multiple medical comorbidities and substance use disorder. Presented with subjective anxiety and seeking clonazepam. UDS positive for prescribed opiates. Very somatic and dramatic on the unit.  Nursing staff reports that patient slept well.  He has been adherent with his medication.  He was given as needed Haldol and Benadryl yesterday when he became somatic and wanted to be discharged.  Seen today.  Patient tells me that the medications are okay but he still has some residual anxiety.  He describes these as "panic attacks".  States that the medication is helping him but is not doing enough yet.  Patient reports feeling down.  States that he takes care of his aged parents, worried that he may not be able to take care of them if he is feeling the way he is feeling now.  Patient is not endorsing any suicidal thoughts.  There are no rageful thoughts towards others or to property.  He is not endorsing any psychotic symptoms.  There are no manic symptoms.  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 06/15/2018   Cancer of skin of neck    Cataract    Chronic mesenteric ischemia ([name])     Chronic pain syndrome 07/09/2016   Crohn's disease of small and large intestines ([name])    followed by dr Baldo Ash gessner   Dairy product intolerance    Diarrhea, functional    Family history of adverse reaction to anesthesia    Grover's disease    transient acantholytic dermatosis   History of alcohol abuse    History of basal cell carcinoma excision    2013 left leg   History of Clostridium difficile    10/ 2014   History of multiple concussions    x6   last one Jan 2017 per pt--  no residual   History of substance abuse ([name])    quit 1997 per pt   History of suicide attempt    05-18-2012  overdose /  failure to thrive   Iron deficiency anemia due to chronic blood loss    Major depression, recurrent, chronic ([name])    Osteopenia    Personal history of adenomatous colonic polyps 12/2010, 03/2012   12/2010 - 8 mm serrated adenoma of rectum   Portal vein thrombosis 03/21/2015   right   Primary sclerosing cholangitis    ? 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   Substance abuse ([name]) 1997   Alcohol   Vitamin A deficiency 05/23/2018   Vitamin B6 deficiency 07/27/2018   Vitamin C deficiency 05/23/2018    Past Surgical History:  Procedure Laterality Date  ABDOMINAL AORTAGRAM N/A 03/29/2012   Procedure: ABDOMINAL AORTAGRAM;  Surgeon: Nada Libman, MD;  Location: [facility] CATH LAB;  Service: Cardiovascular;  Laterality: N/A;   ADENOIDECTOMY  age 32   BIOPSY  05/31/2018   Procedure: BIOPSY;  Surgeon: Rachael Fee, MD;  Location: [facility] ENDOSCOPY;  Service: Endoscopy;;   CATARACT EXTRACTION W/ INTRAOCULAR LENS  IMPLANT, BILATERAL  2009   COLONOSCOPY  2001, 05/02/2003, 01/28/11   2012: Right colon Crohn's, rectal polyp   COLONOSCOPY  03/31/2012   Procedure: COLONOSCOPY;  Surgeon: Beverley Fiedler, MD;  Location: [facility] ENDOSCOPY;  Service: Gastroenterology;  Laterality: N/A;   COLONOSCOPY WITH PROPOFOL N/A 05/31/2018    Procedure: COLONOSCOPY WITH PROPOFOL;  Surgeon: Rachael Fee, MD;  Location: [facility] ENDOSCOPY;  Service: Endoscopy;  Laterality: N/A;   ESOPHAGOGASTRODUODENOSCOPY  01/28/2011   Normal   FOOT SURGERY Right age 19   MOHS SURGERY Left 11/2013   left ankle parakerotosis    PERCUTANEOUS LIVER BIOPSY  2007 and 2008   PILONIDAL CYST EXCISION  age 68   PLACEMENT OF SETON N/A 11/06/2015   Procedure: PLACEMENT OF SETON;  Surgeon: Romie Levee, MD;  Location: [facility];  Service: General;  Laterality: N/A;   PLACEMENT OF SETON  07/2018   at [facility]   RECTAL EXAM UNDER ANESTHESIA N/A 01/18/2019   Procedure: ANAL EXAM UNDER ANESTHESIA,  INCISION AND DRAINAGE, SETON PLACEMENT;  Surgeon: Romie Levee, MD;  Location: [facility] [place];  Service: General;  Laterality: N/A;   UPPER GASTROINTESTINAL ENDOSCOPY     Family History:  Family History  Problem Relation Age of Onset   Heart disease Father    Thyroid cancer Father    Allergies Father    Clotting disorder Father    Breast cancer Mother    Stomach cancer Mother    Colon cancer Neg Hx    Esophageal cancer Neg Hx    Rectal cancer Neg Hx    Family Psychiatric  History:  See H&P  Social History:  Social History   Substance and Sexual Activity  Alcohol Use No   Alcohol/week: 0.0 standard drinks of alcohol     Social History   Substance and Sexual Activity  Drug Use Not Currently   Types: Cocaine, Marijuana   Comment: QUIT USING DRUGS IN 1997    Social History   Socioeconomic History   Marital status: Single    Spouse name: Not on file   Number of children: 1   Years of education: Not on file   Highest education level: Not on file  Occupational History   Occupation: Disability   Tobacco Use   Smoking status: Former    Current packs/day: 0.00    Average packs/day: 1 pack/day for 15.0 years (15.0 ttl pk-yrs)    Types: Cigarettes    Start date: 03/28/1982    Quit date: 03/28/1997    Years since  quitting: 26.0   Smokeless tobacco: Never  Vaping Use   Vaping status: Never Used  Substance and Sexual Activity   Alcohol use: No    Alcohol/week: 0.0 standard drinks of alcohol   Drug use: Not Currently    Types: Cocaine, Marijuana    Comment: QUIT USING DRUGS IN 1997   Sexual activity: Not Currently  Other Topics Concern   Not on file  Social History Narrative   Single, history of substance abuse in recovery   Previously occupied on medical disability   1 child  Daughter Wellsite geologist,  lives and works in [place]   Elderly parents involved and he helps them   1 brother   Social Drivers of Corporate investment banker Strain: Low Risk  (08/28/2019)   Overall Financial Resource Strain (CARDIA)    Difficulty of Paying Living Expenses: Not hard at all  Food Insecurity: No Food Insecurity (03/28/2023)   Hunger Vital Sign    Worried About Running Out of Food in the Last Year: Never true    Ran Out of Food in the Last Year: Never true  Transportation Needs: No Transportation Needs (03/28/2023)   PRAPARE - Administrator, Civil Service (Medical): No    Lack of Transportation (Non-Medical): No  Physical Activity: Insufficiently Active (11/08/2019)   Exercise Vital Sign    Days of Exercise per Week: 3 days    Minutes of Exercise per Session: 30 min  Stress: Stress Concern Present (08/28/2019)   [employer] of Occupational Health - Occupational Stress Questionnaire    Feeling of Stress : Very much  Social Connections: Socially Isolated (03/28/2023)   Social Connection and Isolation Panel [NHANES]    Frequency of Communication with Friends and Family: Three times a week    Frequency of Social Gatherings with Friends and Family: Twice a week    Attends Religious Services: Never    Database administrator or Organizations: No    Attends Engineer, structural: Never    Marital Status: Divorced     Current Medications: Current Facility-Administered Medications   Medication Dose Route Frequency Provider Last Rate Last Admin   acetaminophen (TYLENOL) tablet 650 mg  650 mg Oral Q6H PRN Starleen Blue, NP       alum & mag hydroxide-simeth (MAALOX/MYLANTA) 200-200-20 MG/5ML suspension 30 mL  30 mL Oral Q4H PRN Onuoha, Chinwendu V, NP   30 mL at 04/01/23 0238   cloNIDine (CATAPRES) tablet 0.1 mg  0.1 mg Oral BH-qamhs Nkwenti, Doris, NP   0.1 mg at 04/01/23 1610   Followed by   Melene Muller ON 04/03/2023] cloNIDine (CATAPRES) tablet 0.1 mg  0.1 mg Oral QAC breakfast Starleen Blue, NP       dicyclomine (BENTYL) capsule 10 mg  10 mg Oral TID AC Nkwenti, Doris, NP   10 mg at 04/01/23 1204   haloperidol (HALDOL) tablet 5 mg  5 mg Oral TID PRN Onuoha, Chinwendu V, NP   5 mg at 04/01/23 9604   And   diphenhydrAMINE (BENADRYL) capsule 50 mg  50 mg Oral TID PRN Onuoha, Chinwendu V, NP   50 mg at 04/01/23 0238   haloperidol lactate (HALDOL) injection 5 mg  5 mg Intramuscular TID PRN Onuoha, Chinwendu V, NP       And   diphenhydrAMINE (BENADRYL) injection 50 mg  50 mg Intramuscular TID PRN Onuoha, Chinwendu V, NP       And   LORazepam (ATIVAN) injection 2 mg  2 mg Intramuscular TID PRN Onuoha, Chinwendu V, NP       haloperidol lactate (HALDOL) injection 10 mg  10 mg Intramuscular TID PRN Onuoha, Chinwendu V, NP       And   diphenhydrAMINE (BENADRYL) injection 50 mg  50 mg Intramuscular TID PRN Onuoha, Chinwendu V, NP       And   LORazepam (ATIVAN) injection 2 mg  2 mg Intramuscular TID PRN Onuoha, Chinwendu V, NP       hydrOXYzine (ATARAX) tablet 25 mg  25 mg Oral TID PRN Starleen Blue, NP  25 mg at 04/01/23 1204   loperamide (IMODIUM) capsule 2-4 mg  2-4 mg Oral PRN Starleen Blue, NP       magnesium hydroxide (MILK OF MAGNESIA) suspension 30 mL  30 mL Oral Daily PRN Onuoha, Chinwendu V, NP       methocarbamol (ROBAXIN) tablet 500 mg  500 mg Oral Q8H PRN Nkwenti, Doris, NP       mirtazapine (REMERON) tablet 15 mg  15 mg Oral QHS Bennett, Christal H, NP   15 mg at  03/31/23 2109   ondansetron (ZOFRAN-ODT) disintegrating tablet 4 mg  4 mg Oral Q6H PRN Starleen Blue, NP       predniSONE (DELTASONE) tablet 30 mg  30 mg Oral Q breakfast Starleen Blue, NP   30 mg at 04/01/23 1610    Lab Results: No results found. However, due to the size of the patient record, not all encounters were searched. Please check Results Review for a complete set of results.  Blood Alcohol level:  Lab Results  Component Value Date   ETH <10 03/28/2023   ETH <10 08/26/2018    Metabolic Disorder Labs: Lab Results  Component Value Date   HGBA1C 6.1 09/18/2014   No results found for: "PROLACTIN" Lab Results  Component Value Date   CHOL 288 (H) 02/05/2011   TRIG 76.0 02/05/2011   HDL 62.50 02/05/2011   CHOLHDL 5 02/05/2011   VLDL 15.2 02/05/2011    Physical Findings: AIMS:  , ,  ,  ,    CIWA:    COWS:  COWS Total Score: 2  Musculoskeletal: Strength & Muscle Tone: within normal limits Gait & Station: normal Patient leans: N/A  Psychiatric Specialty Exam:  Presentation  General Appearance:  Slim build, limited grooming, isolating in his room, good rapport. Eye Contact: Minimal  Speech: Spontaneous, soft spoken.  Mood and Affect  Mood: Depressed or anxious.  Affect: Blunted and mood congruent  Thought Process  Thought Processes: Linear and goal directed.  Descriptions of Associations:Intact  Orientation:Full (Time, Place and Person)  Thought Content: Negative ruminations.  No guilty ruminations.  No suicidal thoughts.  No homicidal thoughts.  No thoughts of violence.  No delusional theme.  No obsessions.  Hallucinations: No hallucination in any modality.   Sensorium  Memory: Good.  Judgment: Fair  Insight: Fair   Chartered certified accountant: Fair  Attention Span: Fair  Recall: Jennelle Human of Knowledge: Fair  Language: Good.  Psychomotor Activity  Slightly decreased psychomotor activity   Assets   Assets: Resilience   Sleep  Sleep: Sleep: Fair    Physical Exam: Physical Exam ROS Blood pressure 105/61, pulse 73, temperature 97.8 F (36.6 C), temperature source Oral, resp. rate 18, height 6\' 1"  (1.854 m), weight 68.5 kg, SpO2 100%. Body mass index is 19.92 kg/m.   Treatment Plan Summary: Patient is still endorsing features of depression and anxiety.  There is no associated manic symptoms.  There are no associated psychotic symptoms.  There are no associated violent thoughts towards himself or towards others.  He is tolerating mirtazapine.  We will optimize his dose to target residual symptoms.  We will evaluate him further.  1.  Increase Mirtazapine to 30 mg at bedtime. 2.  Continue to use as needed hydroxyzine. 3.  Continue to monitor mood behavior and interaction with others. 4.  Encourage unit groups and therapeutic activities. 5.  Social worker will coordinate discharge and aftercare planning.   Georgiann Cocker, MD 04/01/2023, 1:29 PM

## 2023-04-01 NOTE — [facility] Group Notes (Signed)
Spiritual care group facilitated by Chaplain Katy Denessa Cavan, BCC  Group focused on topic of strength. Group members reflected on what thoughts and feelings emerge when they hear this topic. They then engaged in facilitated dialog around how strength is present in their lives. This dialog focused on representing what strength had been to them in their lives (images and patterns given) and what they saw as helpful in their life now (what they needed / wanted).  Activity drew on narrative framework.  Patient Progress: Did not attend.  

## 2023-04-01 NOTE — Plan of Care (Signed)
  Problem: Education: Goal: Emotional status will improve Outcome: Progressing   Problem: Education: Goal: Mental status will improve Outcome: Progressing   Problem: Activity: Goal: Interest or engagement in activities will improve Outcome: Progressing Goal: Sleeping patterns will improve Outcome: Progressing   Problem: Coping: Goal: Ability to verbalize frustrations and anger appropriately will improve Outcome: Progressing Goal: Ability to demonstrate self-control will improve Outcome: Progressing   Problem: Safety: Goal: Periods of time without injury will increase Outcome: Progressing

## 2023-04-01 NOTE — Group Note (Signed)
Recreation Therapy Group Note   Group Topic:Problem Solving  Group Date: 04/01/2023 Start Time: 0930 End Time: 1015 Facilitators: Oneka Parada-McCall, LRT,CTRS Location: [address] Dayroom   Group Topic: Problem Solving  Goal Area(s) Addresses:  Patient will effectively work in a team with other group members. Patient will verbalize importance of using appropriate problem solving techniques.  Patient will identify positive change associated with effective problem solving skills.   Intervention: Worksheets, Music  Activity: Patients were given two sheets of brain teasers. Patients were to work through each puzzle to come up with the answer. Patients had the option of working together or individually.    Education: Problem solving, Use of skills post discharge  Education Outcome: Acknowledges understanding/In group clarification offered/Needs additional education.    Affect/Mood: N/A   Participation Level: Did not attend    Clinical Observations/Individualized Feedback:      Plan: Continue to engage patient in RT group sessions 2-3x/week.   [name], LRT,CTRS 04/01/2023 11:50 AM

## 2023-04-01 NOTE — [facility] Group Notes (Signed)
[facility] Group Notes:  (Nursing/MHT/Case Management/Adjunct)  Date:  04/01/2023  Time:  8:24 PM  Type of Therapy:   AA Group  Participation Level:  Did Not Attend  Participation Quality:    Affect:    Cognitive:    Insight:    Engagement in Group:    Modes of Intervention:    Summary of Progress/Problems: Pt refused to attend group.  Noah Delaine 04/01/2023, 8:24 PM

## 2023-04-01 NOTE — Progress Notes (Signed)
   04/01/23 0920  Psych Admission Type (Psych Patients Only)  Admission Status Voluntary  Psychosocial Assessment  Patient Complaints Anxiety  Eye Contact Fair  Facial Expression Flat  Affect Anxious  Speech Soft  Interaction Cautious  Motor Activity Slow  Appearance/Hygiene Unremarkable  Behavior Characteristics Appropriate to situation;Cooperative  Mood Anxious  Thought Process  Coherency WDL  Content Preoccupation  Delusions WDL  Perception WDL  Hallucination None reported or observed  Judgment Impaired  Confusion None  Danger to Self  Current suicidal ideation? Denies  Self-Injurious Behavior No self-injurious ideation or behavior indicators observed or expressed   Agreement Not to Harm Self Yes  Description of Agreement Verbal  Danger to Others  Danger to Others None reported or observed

## 2023-04-01 NOTE — Group Note (Signed)
Date:  04/01/2023 Time:  1:40 PM  Group Topic/Focus:  Goals Group:   The focus of this group is to help patients establish daily goals to achieve during treatment and discuss how the patient can incorporate goal setting into their daily lives to aide in recovery. Orientation:   The focus of this group is to educate the patient on the purpose and policies of crisis stabilization and provide a format to answer questions about their admission.  The group details unit policies and expectations of patients while admitted.    Participation Level:  Did Not Attend  Participation Quality:   n/a  Affect:   n/a  Cognitive:   n/a  Insight: None  Engagement in Group:   n/a  Modes of Intervention:   n/a  Additional Comments:   Pt did not attend.  Edmund Hilda Maddox Bratcher 04/01/2023, 1:40 PM

## 2023-04-01 NOTE — Progress Notes (Signed)
Patient alert and oriented x 4. Patient woke up complaining of anxiety 10/10 and irritability. Benadryl 50 mg and haldol 5 mg given and was effective. Will continue to monitor and maintain safety.

## 2023-04-01 NOTE — Plan of Care (Signed)
  Problem: Coping: Goal: Ability to verbalize frustrations and anger appropriately will improve Outcome: Progressing Goal: Ability to demonstrate self-control will improve Outcome: Progressing   Problem: Health Behavior/Discharge Planning: Goal: Identification of resources available to assist in meeting health care needs will improve Outcome: Progressing Goal: Compliance with treatment plan for underlying cause of condition will improve Outcome: Progressing   Problem: Physical Regulation: Goal: Ability to maintain clinical measurements within normal limits will improve Outcome: Progressing   Problem: Safety: Goal: Periods of time without injury will increase Outcome: Progressing   

## 2023-04-02 DIAGNOSIS — F332 Major depressive disorder, recurrent severe without psychotic features: Secondary | ICD-10-CM | POA: Diagnosis not present

## 2023-04-02 MED ORDER — HYDROXYZINE HCL 10 MG PO TABS
10.0000 mg | ORAL_TABLET | Freq: Three times a day (TID) | ORAL | Status: DC | PRN
  Administered 2023-04-02 – 2023-04-04 (×6): 10 mg via ORAL
  Filled 2023-04-02 (×6): qty 1

## 2023-04-02 MED ORDER — ESCITALOPRAM OXALATE 5 MG PO TABS
5.0000 mg | ORAL_TABLET | Freq: Every day | ORAL | Status: DC
Start: 2023-04-02 — End: 2023-04-04
  Administered 2023-04-02 – 2023-04-03 (×2): 5 mg via ORAL
  Filled 2023-04-02 (×3): qty 1

## 2023-04-02 MED ORDER — TRAZODONE HCL 50 MG PO TABS
50.0000 mg | ORAL_TABLET | Freq: Once | ORAL | Status: AC | PRN
  Administered 2023-04-02: 50 mg via ORAL
  Filled 2023-04-02: qty 1

## 2023-04-02 NOTE — Progress Notes (Signed)
   04/01/23 2108  Psych Admission Type (Psych Patients Only)  Admission Status Voluntary  Psychosocial Assessment  Patient Complaints Anxiety  Eye Contact Fair  Facial Expression Flat  Affect Anxious;Depressed  Speech Logical/coherent;Soft  Interaction Cautious  Motor Activity Slow  Appearance/Hygiene Unremarkable  Behavior Characteristics Cooperative  Mood Anxious;Pleasant  Thought Process  Coherency WDL  Content Preoccupation  Delusions None reported or observed  Perception WDL  Hallucination None reported or observed  Judgment Impaired  Confusion None  Danger to Self  Current suicidal ideation? Denies  Self-Injurious Behavior No self-injurious ideation or behavior indicators observed or expressed   Agreement Not to Harm Self Yes  Description of Agreement verbal  Danger to Others  Danger to Others None reported or observed

## 2023-04-02 NOTE — Plan of Care (Signed)
  Problem: Education: Goal: Mental status will improve Outcome: Progressing   Problem: Coping: Goal: Ability to verbalize frustrations and anger appropriately will improve Outcome: Progressing Goal: Ability to demonstrate self-control will improve Outcome: Progressing   Problem: Physical Regulation: Goal: Ability to maintain clinical measurements within normal limits will improve Outcome: Progressing   Problem: Safety: Goal: Periods of time without injury will increase Outcome: Progressing

## 2023-04-02 NOTE — Progress Notes (Signed)
Pt complaining of increased anxiety, feeling shaky , restless and muscle tremors and aches. Pt primary RN on break. Pt given prn hydroxyzine and prn robaxin. Pt also given gatorade and po fluids and encouraged to alert staff if no improvement.

## 2023-04-02 NOTE — Progress Notes (Signed)
[facility] MD Progress Note  04/02/2023 11:07 AM [name]  MRN:  [id number] Subjective:   58 year old Caucasian male, single, on SSI disability, lives with his aged parents. Background history of multiple medical comorbidities and substance use disorder. Presented with subjective anxiety and seeking clonazepam. UDS positive for prescribed opiates. Very somatic and dramatic on the unit.  Nursing staff reports that patient slept for just 3.5 hours.  He has been restless and pacing around in his room.  He is not engaging with groups.  No observed response to internal stimuli.  He has been getting hydroxyzine around the clock.  Seen today.  The patient tells me that he does not feel well.  States that he is physically weak.  States that he gets unsteady especially when he gets up.  Describes a feeling of almost passing out.  He continues to report anxiety and worries about coping.  He is not endorsing any suicidal thoughts.  No violent thoughts towards others or to property.  I explored augmentation with escitalopram.  Patient consented after reviewing the pros and cons.  We will wean off hydroxyzine as the sedating properties could be contributing to his physical tiredness.   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 06/15/2018   Cancer of skin of neck    Cataract     Chronic mesenteric ischemia ([name])    Chronic pain syndrome 07/09/2016   Crohn's disease of small and large intestines ([name])    followed by dr Baldo Ash gessner   Dairy product intolerance    Diarrhea, functional    Family history of adverse reaction to anesthesia    Grover's disease    transient acantholytic dermatosis   History of alcohol abuse    History of basal cell carcinoma excision    2013 left leg   History of Clostridium difficile    10/ 2014   History of multiple concussions    x6   last one Jan 2017 per pt--  no residual   History of substance abuse ([name])    quit 1997 per pt   History of suicide attempt    05-18-2012  overdose /  failure to thrive   Iron deficiency anemia due to chronic blood loss    Major depression, recurrent, chronic ([name])    Osteopenia    Personal history of adenomatous colonic polyps 12/2010, 03/2012   12/2010 - 8 mm serrated adenoma of rectum   Portal vein thrombosis 03/21/2015   right   Primary sclerosing cholangitis    ? 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   Substance abuse ([name]) 1997   Alcohol   Vitamin A deficiency 05/23/2018   Vitamin B6 deficiency 07/27/2018   Vitamin C deficiency 05/23/2018    Past Surgical History:  Procedure Laterality Date  ABDOMINAL AORTAGRAM N/A 03/29/2012   Procedure: ABDOMINAL AORTAGRAM;  Surgeon: Nada Libman, MD;  [facility]: [facility] CATH LAB;  Service: Cardiovascular;  Laterality: N/A;   ADENOIDECTOMY  age 30   BIOPSY  05/31/2018   Procedure: BIOPSY;  Surgeon: Rachael Fee, MD;  Location: [facility] ENDOSCOPY;  Service: Endoscopy;;   CATARACT EXTRACTION W/ INTRAOCULAR LENS  IMPLANT, BILATERAL  2009   COLONOSCOPY  2001, 05/02/2003, 01/28/11   2012: Right colon Crohn's, rectal polyp   COLONOSCOPY  03/31/2012   Procedure: COLONOSCOPY;  Surgeon: Beverley Fiedler, MD;  Location: [facility] ENDOSCOPY;  Service: Gastroenterology;  Laterality: N/A;   COLONOSCOPY  WITH PROPOFOL N/A 05/31/2018   Procedure: COLONOSCOPY WITH PROPOFOL;  Surgeon: Rachael Fee, MD;  Location: [facility] ENDOSCOPY;  Service: Endoscopy;  Laterality: N/A;   ESOPHAGOGASTRODUODENOSCOPY  01/28/2011   Normal   FOOT SURGERY Right age 8   MOHS SURGERY Left 11/2013   left ankle parakerotosis    PERCUTANEOUS LIVER BIOPSY  2007 and 2008   PILONIDAL CYST EXCISION  age 62   PLACEMENT OF SETON N/A 11/06/2015   Procedure: PLACEMENT OF SETON;  Surgeon: Romie Levee, MD;  Location: [facility];  Service: General;  Laterality: N/A;   PLACEMENT OF SETON  07/2018   at [facility]   RECTAL EXAM UNDER ANESTHESIA N/A 01/18/2019   Procedure: ANAL EXAM UNDER ANESTHESIA,  INCISION AND DRAINAGE, SETON PLACEMENT;  Surgeon: Romie Levee, MD;  Location: [facility];  Service: General;  Laterality: N/A;   UPPER GASTROINTESTINAL ENDOSCOPY     Family History:  Family History  Problem Relation Age of Onset   Heart disease Father    Thyroid cancer Father    Allergies Father    Clotting disorder Father    Breast cancer Mother    Stomach cancer Mother    Colon cancer Neg Hx    Esophageal cancer Neg Hx    Rectal cancer Neg Hx    Family Psychiatric  History:  See H&P  Social History:  Social History   Substance and Sexual Activity  Alcohol Use No   Alcohol/week: 0.0 standard drinks of alcohol     Social History   Substance and Sexual Activity  Drug Use Not Currently   Types: Cocaine, Marijuana   Comment: QUIT USING DRUGS IN 1997    Social History   Socioeconomic History   Marital status: Single    Spouse name: Not on file   Number of children: 1   Years of education: Not on file   Highest education level: Not on file  Occupational History   Occupation: Disability   Tobacco Use   Smoking status: Former    Current packs/day: 0.00    Average packs/day: 1 pack/day for 15.0 years (15.0 ttl pk-yrs)    Types: Cigarettes    Start date: 03/28/1982    Quit  date: 03/28/1997    Years since quitting: 26.0   Smokeless tobacco: Never  Vaping Use   Vaping status: Never Used  Substance and Sexual Activity   Alcohol use: No    Alcohol/week: 0.0 standard drinks of alcohol   Drug use: Not Currently    Types: Cocaine, Marijuana    Comment: QUIT USING DRUGS IN 1997   Sexual activity: Not Currently  Other Topics Concern   Not on file  Social History Narrative   Single, history of substance abuse in recovery   Previously occupied on medical disability   1 child  Daughter Wellsite geologist,  lives and works in [place]   Elderly parents involved and he helps them   1 brother   Social Drivers of Corporate investment banker Strain: Low Risk  (08/28/2019)   Overall Financial Resource Strain (CARDIA)    Difficulty of Paying Living Expenses: Not hard at all  Food Insecurity: No Food Insecurity (03/28/2023)   Hunger Vital Sign    Worried About Running Out of Food in the Last Year: Never true    Ran Out of Food in the Last Year: Never true  Transportation Needs: No Transportation Needs (03/28/2023)   PRAPARE - Administrator, Civil Service (Medical): No    Lack of Transportation (Non-Medical): No  Physical Activity: Insufficiently Active (11/08/2019)   Exercise Vital Sign    Days of Exercise per Week: 3 days    Minutes of Exercise per Session: 30 min  Stress: Stress Concern Present (08/28/2019)   [employer] of Occupational Health - Occupational Stress Questionnaire    Feeling of Stress : Very much  Social Connections: Socially Isolated (03/28/2023)   Social Connection and Isolation Panel [NHANES]    Frequency of Communication with Friends and Family: Three times a week    Frequency of Social Gatherings with Friends and Family: Twice a week    Attends Religious Services: Never    Database administrator or Organizations: No    Attends Engineer, structural: Never    Marital Status: Divorced     Current Medications: Current  Facility-Administered Medications  Medication Dose Route Frequency Provider Last Rate Last Admin   acetaminophen (TYLENOL) tablet 650 mg  650 mg Oral Q6H PRN Starleen Blue, NP       alum & mag hydroxide-simeth (MAALOX/MYLANTA) 200-200-20 MG/5ML suspension 30 mL  30 mL Oral Q4H PRN Onuoha, Chinwendu V, NP   30 mL at 04/01/23 1521   cloNIDine (CATAPRES) tablet 0.1 mg  0.1 mg Oral BH-qamhs Nkwenti, Doris, NP   0.1 mg at 04/02/23 1610   Followed by   Melene Muller ON 04/03/2023] cloNIDine (CATAPRES) tablet 0.1 mg  0.1 mg Oral QAC breakfast Starleen Blue, NP       dicyclomine (BENTYL) capsule 10 mg  10 mg Oral TID AC Nkwenti, Doris, NP   10 mg at 04/02/23 9604   haloperidol (HALDOL) tablet 5 mg  5 mg Oral TID PRN Onuoha, Chinwendu V, NP   5 mg at 04/01/23 5409   And   diphenhydrAMINE (BENADRYL) capsule 50 mg  50 mg Oral TID PRN Onuoha, Chinwendu V, NP   50 mg at 04/01/23 0238   haloperidol lactate (HALDOL) injection 5 mg  5 mg Intramuscular TID PRN Onuoha, Chinwendu V, NP       And   diphenhydrAMINE (BENADRYL) injection 50 mg  50 mg Intramuscular TID PRN Onuoha, Chinwendu V, NP       And   LORazepam (ATIVAN) injection 2 mg  2 mg Intramuscular TID PRN Onuoha, Chinwendu V, NP       haloperidol lactate (HALDOL) injection 10 mg  10 mg Intramuscular TID PRN Onuoha, Chinwendu V, NP       And   diphenhydrAMINE (BENADRYL) injection 50 mg  50 mg Intramuscular TID PRN Onuoha, Chinwendu V, NP       And   LORazepam (ATIVAN) injection 2 mg  2 mg Intramuscular TID PRN Onuoha, Chinwendu V, NP       hydrOXYzine (ATARAX) tablet 25 mg  25 mg Oral TID PRN Starleen Blue, NP  25 mg at 04/02/23 9629   loperamide (IMODIUM) capsule 2-4 mg  2-4 mg Oral PRN Starleen Blue, NP       magnesium hydroxide (MILK OF MAGNESIA) suspension 30 mL  30 mL Oral Daily PRN Onuoha, Chinwendu V, NP       methocarbamol (ROBAXIN) tablet 500 mg  500 mg Oral Q8H PRN Nkwenti, Doris, NP       mirtazapine (REMERON) tablet 30 mg  30 mg Oral QHS  Jalacia Mattila A, MD   30 mg at 04/01/23 2108   ondansetron (ZOFRAN-ODT) disintegrating tablet 4 mg  4 mg Oral Q6H PRN Starleen Blue, NP       predniSONE (DELTASONE) tablet 30 mg  30 mg Oral Q breakfast Starleen Blue, NP   30 mg at 04/02/23 5284    Lab Results: No results found. However, due to the size of the patient record, not all encounters were searched. Please check Results Review for a complete set of results.  Blood Alcohol level:  Lab Results  Component Value Date   ETH <10 03/28/2023   ETH <10 08/26/2018    Metabolic Disorder Labs: Lab Results  Component Value Date   HGBA1C 6.1 09/18/2014   No results found for: "PROLACTIN" Lab Results  Component Value Date   CHOL 288 (H) 02/05/2011   TRIG 76.0 02/05/2011   HDL 62.50 02/05/2011   CHOLHDL 5 02/05/2011   VLDL 15.2 02/05/2011    Physical Findings: AIMS:  , ,  ,  ,    CIWA:    COWS:  COWS Total Score: 2  Musculoskeletal: Strength & Muscle Tone: within normal limits Gait & Station: normal Patient leans: N/A  Psychiatric Specialty Exam:  Presentation  General Appearance:  Slim build, limited grooming, isolating in his room, no EPS.  Eye Contact: Limited eye contact  Speech: Spontaneous, soft spoken.  Mood and Affect  Mood: Depressed and anxious.  Affect: Blunted and mood congruent  Thought Process  Thought Processes: Linear and goal directed.  Descriptions of Associations:Intact  Orientation:Full (Time, Place and Person)  Thought Content: Negative ruminations.  No guilty ruminations.  No suicidal thoughts.  No homicidal thoughts.  No thoughts of violence.  No delusional theme.  No obsessions.  Hallucinations: No hallucination in any modality.   Sensorium  Memory: Good.  Judgment: Fair  Insight: Fair   Chartered certified accountant: Fair  Attention Span: Fair  Recall: Jennelle Human of Knowledge: Fair  Language: Good.  Psychomotor Activity  Slightly  decreased psychomotor activity   Assets  Assets: Resilience   Sleep  Sleep: No data recorded    Physical Exam: Physical Exam ROS Blood pressure 119/65, pulse 76, temperature 97.8 F (36.6 C), temperature source Oral, resp. rate 18, height 6\' 1"  (1.854 m), weight 68.5 kg, SpO2 98%. Body mass index is 19.92 kg/m.   Treatment Plan Summary: Patient has tolerated recent optimization in dose of mirtazapine.  He continues to endorse depression and anxiety.  He is not sleeping well at night.  He is not endorsing any manic symptoms.  No psychotic symptoms.  No rageful thoughts towards himself or towards others.  We will make adjustments as discussed above.  We will continue to evaluate him.  1.  Mirtazapine to 30 mg at bedtime. 2.  Escitalopram 5 mg at bedtime.  Will gradually titrate towards antianxiety dose. 3.  Decrease hydroxyzine to 10 mg as needed. 4.  Continue to monitor mood behavior and interaction with others. 5.  Continue to encourage  unit groups and therapeutic activities. 6.  Social worker will coordinate discharge and aftercare planning.   Georgiann Cocker, MD 04/02/2023, 11:07 AM

## 2023-04-02 NOTE — [facility] Group Notes (Signed)
[facility] Group Notes:  (Nursing/MHT/Case Management/Adjunct)  Date:  04/02/2023  Time:  9:21 PM  Type of Therapy:   Wrap-up group  Participation Level:  Did Not Attend  Participation Quality:    Affect:    Cognitive:    Insight:    Engagement in Group:    Modes of Intervention:    Summary of Progress/Problems: Pt refused to attend group. Writer provided pt with list of coping skills.   [name] 04/02/2023, 9:21 PM

## 2023-04-02 NOTE — Progress Notes (Signed)
Patient rated his anxiety level 10/10 and his depression level 8/10 with 10 being the highest and 0 none. Patient identified his goal for today as, " Be honest about my problems". Pt is medication compliant, declined to attend group therapy. Pt noted to be reclusive to his room most of shift. Minimal interaction observed with peers. Appetite good on shift, pt tolerated meal tray from the cafeteria. Vistaril 10 mg po administered for complaints of anxiety with effective results. Safety maintained.  04/02/23 0910  Psych Admission Type (Psych Patients Only)  Admission Status Voluntary  Psychosocial Assessment  Patient Complaints Anxiety  Eye Contact Fair  Facial Expression Flat  Affect Anxious;Depressed  Speech Logical/coherent;Soft  Interaction Cautious  Motor Activity Slow  Appearance/Hygiene Unremarkable  Behavior Characteristics Appropriate to situation  Mood Anxious;Pleasant  Thought Process  Coherency WDL  Content Preoccupation  Delusions None reported or observed  Perception WDL  Hallucination None reported or observed  Judgment Impaired  Confusion None  Danger to Self  Current suicidal ideation? Denies  Self-Injurious Behavior No self-injurious ideation or behavior indicators observed or expressed   Agreement Not to Harm Self Yes  Description of Agreement Verbal  Danger to Others  Danger to Others None reported or observed

## 2023-04-02 NOTE — Progress Notes (Signed)
Pt reported difficulty with sleep and requested additional medication for sleep. Notified on-call provider. Pt was given a one time dose of Trazodone.

## 2023-04-02 NOTE — Progress Notes (Signed)
   04/02/23 0600  15 Minute Checks  Location Bedroom  Visual Appearance Calm  Behavior Composed  Sleep (Behavioral Health Patients Only)  Calculate sleep? (Click Yes once per 24 hr at 0600 safety check) Yes  Documented sleep last 24 hours 3.5

## 2023-04-02 NOTE — Group Note (Unsigned)
Date:  04/04/2023 Time:  7:34 AM  Group Topic/Focus:  Emotional Education:   The focus of this group is to discuss what feelings/emotions are, and how they are experienced.    Participation Level:  Did Not Attend  Deforest Hoyles [facility] 04/04/2023, 7:34 AM

## 2023-04-02 NOTE — Group Note (Signed)
Date:  04/02/2023 Time:  9:43 AM  Group Topic/Focus:  Goals Group:   The focus of this group is to help patients establish daily goals to achieve during treatment and discuss how the patient can incorporate goal setting into their daily lives to aide in recovery. Orientation:   The focus of this group is to educate the patient on the purpose and policies of crisis stabilization and provide a format to answer questions about their admission.  The group details unit policies and expectations of patients while admitted.    Participation Level:  Did Not Attend  Deforest Hoyles [facility] 04/02/2023, 9:43 AM

## 2023-04-02 NOTE — [facility] Group Notes (Signed)
[facility] Group Notes:  (Nursing)  Date:  04/02/2023  Time: 1400  Type of Therapy:  Psychoeducational Skills  Participation Level:  Did Not Attend   Shela Nevin 04/02/2023, 3:59 PM

## 2023-04-02 NOTE — Progress Notes (Signed)
   04/02/23 2156  Psych Admission Type (Psych Patients Only)  Admission Status Voluntary  Psychosocial Assessment  Patient Complaints Anxiety  Eye Contact Fair  Facial Expression Flat  Affect Appropriate to circumstance  Speech Logical/coherent  Interaction Guarded  Motor Activity Slow  Appearance/Hygiene Unremarkable  Behavior Characteristics Appropriate to situation  Mood Depressed;Pleasant;Anxious  Thought Process  Coherency WDL  Content Preoccupation  Delusions None reported or observed  Perception WDL  Hallucination None reported or observed  Judgment Impaired  Confusion None  Danger to Self  Current suicidal ideation? Denies  Self-Injurious Behavior No self-injurious ideation or behavior indicators observed or expressed   Agreement Not to Harm Self Yes  Description of Agreement verbal  Danger to Others  Danger to Others None reported or observed

## 2023-04-02 NOTE — Plan of Care (Signed)
   Problem: Safety: Goal: Periods of time without injury will increase Outcome: Progressing

## 2023-04-03 DIAGNOSIS — F332 Major depressive disorder, recurrent severe without psychotic features: Secondary | ICD-10-CM | POA: Diagnosis not present

## 2023-04-03 LAB — LIPID PANEL
Cholesterol: 209 mg/dL — ABNORMAL HIGH (ref 0–200)
HDL: 65 mg/dL (ref >40)
LDL Cholesterol: 129 mg/dL — ABNORMAL HIGH (ref 0–99)
Total CHOL/HDL Ratio: 3.2 {ratio}
Triglycerides: 75 mg/dL (ref <150)
VLDL: 15 mg/dL (ref 0–40)

## 2023-04-03 LAB — DIFFERENTIAL
Abs Immature Granulocytes: 0.5 10*3/uL — ABNORMAL HIGH (ref 0.00–0.07)
Basophils Absolute: 0.1 10*3/uL (ref 0.0–0.1)
Basophils Relative: 1 %
Eosinophils Absolute: 0 10*3/uL (ref 0.0–0.5)
Eosinophils Relative: 0 %
Immature Granulocytes: 6 %
Lymphocytes Relative: 4 %
Lymphs Abs: 0.4 10*3/uL — ABNORMAL LOW (ref 0.7–4.0)
Monocytes Absolute: 0.9 10*3/uL (ref 0.1–1.0)
Monocytes Relative: 10 %
Neutro Abs: 7.2 10*3/uL (ref 1.7–7.7)
Neutrophils Relative %: 79 %

## 2023-04-03 LAB — COMPREHENSIVE METABOLIC PANEL
ALT: 63 U/L — ABNORMAL HIGH (ref 0–44)
AST: 31 U/L (ref 15–41)
Albumin: 3.1 g/dL — ABNORMAL LOW (ref 3.5–5.0)
Alkaline Phosphatase: 103 U/L (ref 38–126)
Anion gap: 9 (ref 5–15)
BUN: 19 mg/dL (ref 6–20)
CO2: 27 mmol/L (ref 22–32)
Calcium: 8.3 mg/dL — ABNORMAL LOW (ref 8.9–10.3)
Chloride: 95 mmol/L — ABNORMAL LOW (ref 98–111)
Creatinine, Ser: 1.08 mg/dL (ref 0.61–1.24)
GFR, Estimated: >60 mL/min (ref >60)
Glucose, Bld: 125 mg/dL — ABNORMAL HIGH (ref 70–99)
Potassium: 4.1 mmol/L (ref 3.5–5.1)
Sodium: 131 mmol/L — ABNORMAL LOW (ref 135–145)
Total Bilirubin: 1 mg/dL (ref 0.0–1.2)
Total Protein: 6 g/dL — ABNORMAL LOW (ref 6.5–8.1)

## 2023-04-03 LAB — CBC
HCT: 47.4 % (ref 39.0–52.0)
Hemoglobin: 15.6 g/dL (ref 13.0–17.0)
MCH: 32.5 pg (ref 26.0–34.0)
MCHC: 32.9 g/dL (ref 30.0–36.0)
MCV: 98.8 fL (ref 80.0–100.0)
Platelets: 166 10*3/uL (ref 150–400)
RBC: 4.8 MIL/uL (ref 4.22–5.81)
RDW: 14.6 % (ref 11.5–15.5)
WBC: 9 10*3/uL (ref 4.0–10.5)
nRBC: 0 % (ref 0.0–0.2)

## 2023-04-03 MED ORDER — TRAZODONE HCL 50 MG PO TABS
50.0000 mg | ORAL_TABLET | Freq: Once | ORAL | Status: DC | PRN
  Filled 2023-04-03: qty 1

## 2023-04-03 MED ORDER — DIPHENHYDRAMINE HCL 25 MG PO CAPS
ORAL_CAPSULE | ORAL | Status: AC
  Filled 2023-04-03: qty 1

## 2023-04-03 MED ORDER — DIPHENHYDRAMINE HCL 25 MG PO CAPS
25.0000 mg | ORAL_CAPSULE | Freq: Every evening | ORAL | Status: DC | PRN
  Administered 2023-04-03 – 2023-04-04 (×2): 25 mg via ORAL
  Filled 2023-04-03: qty 1

## 2023-04-03 NOTE — Progress Notes (Signed)
[facility] MD Progress Note  04/03/2023 12:19 PM [name]  MRN:  [id number] Subjective:   58 year old Caucasian male, single, on SSI disability, lives with his aged parents. Background history of multiple medical comorbidities and substance use disorder. Presented with subjective anxiety and seeking clonazepam. UDS positive for prescribed opiates. Very somatic and dramatic on the unit.  Staff reports that patient slept for less than 2 hours last night.  He continues to isolate in his room.  He is not participating with unit groups or therapeutic activities.  No observed response to internal stimuli.  Seen today.  Patient has tolerated recent introduction of escitalopram well.  He states that he slept from 10 PM to 1:30 AM.  States that when he woke up he took it as needed for which kept him up for most of the rest of the night.  Patient reports feeling tired this morning.  He is not endorsing any form of hallucination.  Is not endorsing any form of delusion.  He continues to feel down and anxious.  No suicidal thoughts.  No rageful thoughts towards others or to property.  He continues to worry about his ability to function at home.  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 06/15/2018   Cancer of skin of neck    Cataract    Chronic mesenteric ischemia ([name])     Chronic pain syndrome 07/09/2016   Crohn's disease of small and large intestines ([name])    followed by dr Baldo Ash gessner   Dairy product intolerance    Diarrhea, functional    Family history of adverse reaction to anesthesia    Grover's disease    transient acantholytic dermatosis   History of alcohol abuse    History of basal cell carcinoma excision    2013 left leg   History of Clostridium difficile    10/ 2014   History of multiple concussions    x6   last one Jan 2017 per pt--  no residual   History of substance abuse ([name])    quit 1997 per pt   History of suicide attempt    05-18-2012  overdose /  failure to thrive   Iron deficiency anemia due to chronic blood loss    Major depression, recurrent, chronic ([name])    Osteopenia    Personal history of adenomatous colonic polyps 12/2010, 03/2012   12/2010 - 8 mm serrated adenoma of rectum   Portal vein thrombosis 03/21/2015   right   Primary sclerosing cholangitis    ? 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   Substance abuse ([name]) 1997   Alcohol   Vitamin A deficiency 05/23/2018   Vitamin B6 deficiency 07/27/2018   Vitamin C deficiency 05/23/2018    Past Surgical History:  Procedure Laterality Date  ABDOMINAL AORTAGRAM N/A 03/29/2012   Procedure: ABDOMINAL AORTAGRAM;  Surgeon: Nada Libman, MD;  Location: [facility] CATH LAB;  Service: Cardiovascular;  Laterality: N/A;   ADENOIDECTOMY  age 48   BIOPSY  05/31/2018   Procedure: BIOPSY;  Surgeon: Rachael Fee, MD;  Location: [facility] ENDOSCOPY;  Service: Endoscopy;;   CATARACT EXTRACTION W/ INTRAOCULAR LENS  IMPLANT, BILATERAL  2009   COLONOSCOPY  2001, 05/02/2003, 01/28/11   2012: Right colon Crohn's, rectal polyp   COLONOSCOPY  03/31/2012   Procedure: COLONOSCOPY;  Surgeon: Beverley Fiedler, MD;  Location: [facility] ENDOSCOPY;  Service: Gastroenterology;  Laterality: N/A;   COLONOSCOPY WITH PROPOFOL N/A 05/31/2018    Procedure: COLONOSCOPY WITH PROPOFOL;  Surgeon: Rachael Fee, MD;  Location: [facility] ENDOSCOPY;  Service: Endoscopy;  Laterality: N/A;   ESOPHAGOGASTRODUODENOSCOPY  01/28/2011   Normal   FOOT SURGERY Right age 13   MOHS SURGERY Left 11/2013   left ankle parakerotosis    PERCUTANEOUS LIVER BIOPSY  2007 and 2008   PILONIDAL CYST EXCISION  age 29   PLACEMENT OF SETON N/A 11/06/2015   Procedure: PLACEMENT OF SETON;  Surgeon: Romie Levee, MD;  Location: [facility];  Service: General;  Laterality: N/A;   PLACEMENT OF SETON  07/2018   at [facility]   RECTAL EXAM UNDER ANESTHESIA N/A 01/18/2019   Procedure: ANAL EXAM UNDER ANESTHESIA,  INCISION AND DRAINAGE, SETON PLACEMENT;  Surgeon: Romie Levee, MD;  Location: [facility];  Service: General;  Laterality: N/A;   UPPER GASTROINTESTINAL ENDOSCOPY     Family History:  Family History  Problem Relation Age of Onset   Heart disease Father    Thyroid cancer Father    Allergies Father    Clotting disorder Father    Breast cancer Mother    Stomach cancer Mother    Colon cancer Neg Hx    Esophageal cancer Neg Hx    Rectal cancer Neg Hx    Family Psychiatric  History:  See H&P  Social History:  Social History   Substance and Sexual Activity  Alcohol Use No   Alcohol/week: 0.0 standard drinks of alcohol     Social History   Substance and Sexual Activity  Drug Use Not Currently   Types: Cocaine, Marijuana   Comment: QUIT USING DRUGS IN 1997    Social History   Socioeconomic History   Marital status: Single    Spouse name: Not on file   Number of children: 1   Years of education: Not on file   Highest education level: Not on file  Occupational History   Occupation: Disability   Tobacco Use   Smoking status: Former    Current packs/day: 0.00    Average packs/day: 1 pack/day for 15.0 years (15.0 ttl pk-yrs)    Types: Cigarettes    Start date: 03/28/1982    Quit date: 03/28/1997    Years since  quitting: 26.0   Smokeless tobacco: Never  Vaping Use   Vaping status: Never Used  Substance and Sexual Activity   Alcohol use: No    Alcohol/week: 0.0 standard drinks of alcohol   Drug use: Not Currently    Types: Cocaine, Marijuana    Comment: QUIT USING DRUGS IN 1997   Sexual activity: Not Currently  Other Topics Concern   Not on file  Social History Narrative   Single, history of substance abuse in recovery   Previously occupied on medical disability   1 child  Daughter Wellsite geologist,  lives and works in [place]   Elderly parents involved and he helps them   1 brother   Social Drivers of Corporate investment banker Strain: Low Risk  (08/28/2019)   Overall Financial Resource Strain (CARDIA)    Difficulty of Paying Living Expenses: Not hard at all  Food Insecurity: No Food Insecurity (03/28/2023)   Hunger Vital Sign    Worried About Running Out of Food in the Last Year: Never true    Ran Out of Food in the Last Year: Never true  Transportation Needs: No Transportation Needs (03/28/2023)   PRAPARE - Administrator, Civil Service (Medical): No    Lack of Transportation (Non-Medical): No  Physical Activity: Insufficiently Active (11/08/2019)   Exercise Vital Sign    Days of Exercise per Week: 3 days    Minutes of Exercise per Session: 30 min  Stress: Stress Concern Present (08/28/2019)   [employer] of Occupational Health - Occupational Stress Questionnaire    Feeling of Stress : Very much  Social Connections: Socially Isolated (03/28/2023)   Social Connection and Isolation Panel [NHANES]    Frequency of Communication with Friends and Family: Three times a week    Frequency of Social Gatherings with Friends and Family: Twice a week    Attends Religious Services: Never    Database administrator or Organizations: No    Attends Engineer, structural: Never    Marital Status: Divorced     Current Medications: Current Facility-Administered Medications   Medication Dose Route Frequency Provider Last Rate Last Admin   acetaminophen (TYLENOL) tablet 650 mg  650 mg Oral Q6H PRN Starleen Blue, NP   650 mg at 04/03/23 1034   alum & mag hydroxide-simeth (MAALOX/MYLANTA) 200-200-20 MG/5ML suspension 30 mL  30 mL Oral Q4H PRN Onuoha, Chinwendu V, NP   30 mL at 04/02/23 1702   cloNIDine (CATAPRES) tablet 0.1 mg  0.1 mg Oral QAC breakfast Starleen Blue, NP   0.1 mg at 04/03/23 4098   dicyclomine (BENTYL) capsule 10 mg  10 mg Oral TID AC Nkwenti, Tyler Aas, NP   10 mg at 04/03/23 1191   diphenhydrAMINE (BENADRYL) capsule 25 mg  25 mg Oral QHS PRN Ajibola, Ene A, NP   25 mg at 04/03/23 0134   haloperidol (HALDOL) tablet 5 mg  5 mg Oral TID PRN Onuoha, Chinwendu V, NP   5 mg at 04/01/23 4782   And   diphenhydrAMINE (BENADRYL) capsule 50 mg  50 mg Oral TID PRN Onuoha, Chinwendu V, NP   50 mg at 04/01/23 0238   haloperidol lactate (HALDOL) injection 5 mg  5 mg Intramuscular TID PRN Onuoha, Chinwendu V, NP       And   diphenhydrAMINE (BENADRYL) injection 50 mg  50 mg Intramuscular TID PRN Onuoha, Chinwendu V, NP       And   LORazepam (ATIVAN) injection 2 mg  2 mg Intramuscular TID PRN Onuoha, Chinwendu V, NP       haloperidol lactate (HALDOL) injection 10 mg  10 mg Intramuscular TID PRN Onuoha, Chinwendu V, NP       And   diphenhydrAMINE (BENADRYL) injection 50 mg  50 mg Intramuscular TID PRN Onuoha, Chinwendu V, NP       And   LORazepam (ATIVAN) injection 2 mg  2 mg Intramuscular TID PRN Onuoha, Chinwendu V, NP       escitalopram (LEXAPRO) tablet 5 mg  5 mg Oral QHS Taron Conrey, Delight Ovens, MD  5 mg at 04/02/23 2121   hydrOXYzine (ATARAX) tablet 10 mg  10 mg Oral TID PRN Georgiann Cocker, MD   10 mg at 04/02/23 2120   loperamide (IMODIUM) capsule 2-4 mg  2-4 mg Oral PRN Starleen Blue, NP       magnesium hydroxide (MILK OF MAGNESIA) suspension 30 mL  30 mL Oral Daily PRN Onuoha, Chinwendu V, NP       methocarbamol (ROBAXIN) tablet 500 mg  500 mg Oral Q8H PRN  Starleen Blue, NP   500 mg at 04/02/23 2310   mirtazapine (REMERON) tablet 30 mg  30 mg Oral QHS Jaylaa Gallion, Delight Ovens, MD   30 mg at 04/02/23 2121   ondansetron (ZOFRAN-ODT) disintegrating tablet 4 mg  4 mg Oral Q6H PRN Starleen Blue, NP       predniSONE (DELTASONE) tablet 30 mg  30 mg Oral Q breakfast Starleen Blue, NP   30 mg at 04/03/23 1610    Lab Results: No results found. However, due to the size of the patient record, not all encounters were searched. Please check Results Review for a complete set of results.  Blood Alcohol level:  Lab Results  Component Value Date   ETH <10 03/28/2023   ETH <10 08/26/2018    Metabolic Disorder Labs: Lab Results  Component Value Date   HGBA1C 6.1 09/18/2014   No results found for: "PROLACTIN" Lab Results  Component Value Date   CHOL 288 (H) 02/05/2011   TRIG 76.0 02/05/2011   HDL 62.50 02/05/2011   CHOLHDL 5 02/05/2011   VLDL 15.2 02/05/2011    Physical Findings: AIMS:  , ,  ,  ,    CIWA:  CIWA-Ar Total: 9 COWS:  COWS Total Score: 6  Musculoskeletal: Strength & Muscle Tone: within normal limits Gait & Station: normal Patient leans: N/A  Psychiatric Specialty Exam:  Presentation  General Appearance:  Slim build, lanky, casually dressed, in his room.  No EPS.  Eye Contact: Limited eye contact  Speech: Spontaneous, soft spoken.  Mood and Affect  Mood: Depressed and anxious.  Affect: Blunted and mood congruent  Thought Process  Thought Processes: Linear and goal directed.  Descriptions of Associations:Intact  Orientation:Full (Time, Place and Person)  Thought Content: Negative ruminations.  No guilty ruminations.  No suicidal thoughts.  No homicidal thoughts.  No thoughts of violence.  No delusional theme.  No obsessions.  Hallucinations: No hallucination in any modality.   Sensorium  Memory: Good.  Judgment: Fair  Insight: Fair   Chartered certified accountant: Fair  Attention  Span: Fair  Recall: Jennelle Human of Knowledge: Fair  Language: Good.  Psychomotor Activity  Slightly decreased psychomotor activity   Assets  Assets: Resilience   Sleep  Sleep: No data recorded    Physical Exam: Physical Exam ROS Blood pressure 127/80, pulse 80, temperature 99.6 F (37.6 C), temperature source Oral, resp. rate 18, height 6\' 1"  (1.854 m), weight 68.5 kg, SpO2 100%. Body mass index is 19.92 kg/m.   Treatment Plan Summary: Patient with multiple medical comorbidity who presented with worsening anxiety and depression.  He was focused on benzodiazepines initially.  We initiated mirtazapine which was optimized.  We added escitalopram yesterday.  He is tolerating it well.  He continues to exhibit features of agitated depression.  There are no psychotic symptoms.  We will leave his medicine the same today.  We will hopefully optimize escitalopram in a couple of days.  Not stable for a lower setting of  care.  1.  Mirtazapine to 30 mg at bedtime. 2.  Escitalopram 5 mg at bedtime.  Will gradually titrate towards antianxiety dose. 3.  Bruxism 10 mg as needed. 4.  Continue to monitor mood behavior and interaction with others. 5.  Continue to encourage unit groups and therapeutic activities. 6.  Social worker will coordinate discharge and aftercare planning.   Georgiann Cocker, MD 04/03/2023, 12:19 PM

## 2023-04-03 NOTE — Progress Notes (Signed)
   04/03/23 0525  15 Minute Checks  Location Bedroom  Visual Appearance Calm  Behavior Composed  Sleep (Behavioral Health Patients Only)  Calculate sleep? (Click Yes once per 24 hr at 0600 safety check) Yes  Documented sleep last 24 hours 1.75

## 2023-04-03 NOTE — [facility] Group Notes (Signed)
Pt did not attend goals group. 

## 2023-04-03 NOTE — Group Note (Signed)
[facility] LCSW Group Therapy Note    Group Date: 04/03/2023 Start Time: 1000 End Time: 1120  Type of Therapy and Topic:  Group Therapy:  Overcoming Obstacles  Participation Level:  [facility] PARTICIPATION LEVEL: Did Not Attend  Mood:  Description of Group:   In this group patients will be encouraged to explore what they see as obstacles to their own wellness and recovery. They will be guided to discuss their thoughts, feelings, and behaviors related to these obstacles. The group will process together ways to cope with barriers, with attention given to specific choices patients can make. Each patient will be challenged to identify changes they are motivated to make in order to overcome their obstacles. This group will be process-oriented, with patients participating in exploration of their own experiences as well as giving and receiving support and challenge from other group members.  Therapeutic Goals: 1. Patient will identify personal and current obstacles as they relate to admission. 2. Patient will identify barriers that currently interfere with their wellness or overcoming obstacles.  3. Patient will identify feelings, thought process and behaviors related to these barriers. 4. Patient will identify two changes they are willing to make to overcome these obstacles:    Summary of Patient Progress Patient was invited to group but did not attend.     Therapeutic Modalities:   Cognitive Behavioral Therapy Solution Focused Therapy Motivational Interviewing Relapse Prevention Therapy   Georgie Chard, LCSW

## 2023-04-03 NOTE — Progress Notes (Signed)
Pt temp this AM 99.5.  Pt states he feels like he might be coming down with a cold.  Pt refused labs this morning due to feeling dizzy and like he was  "walking over to the side".  Pt did later state he would allow the technician to come to his room to draw the blood but she had left by then.

## 2023-04-03 NOTE — Progress Notes (Signed)
   04/03/23 0800  Psych Admission Type (Psych Patients Only)  Admission Status Voluntary  Psychosocial Assessment  Patient Complaints Anxiety  Eye Contact Fair  Facial Expression Flat  Affect Appropriate to circumstance  Speech Logical/coherent  Interaction Guarded  Motor Activity Slow  Appearance/Hygiene Unremarkable  Behavior Characteristics Appropriate to situation  Mood Anxious;Pleasant;Depressed  Thought Process  Coherency WDL  Content Preoccupation (Not sleeping)  Delusions None reported or observed  Perception WDL  Hallucination None reported or observed  Judgment Impaired  Confusion None  Danger to Self  Current suicidal ideation? Denies  Self-Injurious Behavior No self-injurious ideation or behavior indicators observed or expressed   Agreement Not to Harm Self Yes  Description of Agreement Verbal  Danger to Others  Danger to Others None reported or observed

## 2023-04-03 NOTE — Progress Notes (Signed)
Pt not sleeping, see previous notes.

## 2023-04-03 NOTE — Group Note (Signed)
Date:  04/03/2023 Time:  9:21 PM  Group Topic/Focus:  Wrap-Up Group:   The focus of this group is to help patients review their daily goal of treatment and discuss progress on daily workbooks.    Participation Level:  did not attend   Participation Quality:  Did not attend   Affect:  Did not attend   Cognitive:  Did not attend   Insight: None  Engagement in Group:  Lacking  Modes of Intervention:  Discussion  Additional Comments:  Patient did not attend wrap up group this evening   Alfonse Ras 04/03/2023, 9:21 PM

## 2023-04-03 NOTE — Plan of Care (Signed)
   Problem: Activity: Goal: Interest or engagement in activities will improve Outcome: Progressing   Problem: Safety: Goal: Periods of time without injury will increase Outcome: Progressing   Problem: Coping: Goal: Ability to verbalize frustrations and anger appropriately will improve Outcome: Progressing

## 2023-04-03 NOTE — Plan of Care (Signed)
  Problem: Activity: Goal: Interest or engagement in activities will improve Outcome: Not Progressing   Problem: Activity: Goal: Sleeping patterns will improve Outcome: Not Progressing

## 2023-04-03 NOTE — Progress Notes (Signed)
   04/03/23 2147  Psych Admission Type (Psych Patients Only)  Admission Status Voluntary  Psychosocial Assessment  Patient Complaints Anxiety;Insomnia;Sleep disturbance  Eye Contact Fair  Facial Expression Flat  Affect Appropriate to circumstance  Speech Logical/coherent  Interaction Guarded  Motor Activity Slow  Appearance/Hygiene Disheveled  Behavior Characteristics Appropriate to situation  Mood Depressed;Anxious  Thought Process  Coherency WDL  Content Preoccupation  Delusions None reported or observed  Perception WDL  Hallucination None reported or observed  Judgment Impaired  Confusion None  Danger to Self  Current suicidal ideation? Denies  Self-Injurious Behavior No self-injurious ideation or behavior indicators observed or expressed   Agreement Not to Harm Self Yes  Description of Agreement verbal  Danger to Others  Danger to Others None reported or observed

## 2023-04-03 NOTE — Progress Notes (Signed)
Pt continues to be unable to sleep.  Pt given all prns available at this time.  Will continue to monitor.

## 2023-04-03 NOTE — [facility] Group Notes (Signed)
[facility] Group Notes:  (Nursing)  Date:  04/03/2023  Time:  1400  Type of Therapy:  Music Therapy  Participation Level:  Did Not Attend   Shela Nevin 04/03/2023, 4:04 PM

## 2023-04-03 NOTE — Progress Notes (Signed)
NP notified Pt not sleeping, new order received for Trazodone 50 mg.  Pt refused this due to episode yesterday morning of nearly passing out.  Doctor had written that he felt the Vistaril was doing this, but Pt felt it may have been the Trazodone.  Order discontinued and NP ordered Benadryl 25 mg at Pt request.  Overrode Pyxis to get this medication for Pt as early as possible as Pt has lab work ordered at 0600 in AM.  Will continue to monitor.

## 2023-04-04 ENCOUNTER — Other Ambulatory Visit: Payer: Self-pay

## 2023-04-04 ENCOUNTER — Encounter (HOSPITAL_COMMUNITY): Payer: Self-pay

## 2023-04-04 ENCOUNTER — Emergency Department (HOSPITAL_COMMUNITY)
Admission: EM | Admit: 2023-04-04 | Discharge: 2023-04-04 | Discharge status: Against Medical Advice | Payer: Medicare Other | Attending: Emergency Medicine | Admitting: Emergency Medicine

## 2023-04-04 DIAGNOSIS — E860 Dehydration: Secondary | ICD-10-CM | POA: Diagnosis not present

## 2023-04-04 DIAGNOSIS — F332 Major depressive disorder, recurrent severe without psychotic features: Secondary | ICD-10-CM | POA: Diagnosis not present

## 2023-04-04 DIAGNOSIS — R197 Diarrhea, unspecified: Secondary | ICD-10-CM | POA: Diagnosis not present

## 2023-04-04 DIAGNOSIS — Z5321 Procedure and treatment not carried out due to patient leaving prior to being seen by health care provider: Secondary | ICD-10-CM | POA: Diagnosis not present

## 2023-04-04 LAB — VITAMIN D 25 HYDROXY (VIT D DEFICIENCY, FRACTURES): Vit D, 25-Hydroxy: 49.73 ng/mL (ref 30–100)

## 2023-04-04 LAB — HEPATITIS PANEL, ACUTE
HCV Ab: NONREACTIVE
Hep A IgM: NONREACTIVE
Hep B C IgM: NONREACTIVE
Hepatitis B Surface Ag: NONREACTIVE

## 2023-04-04 LAB — HEMOGLOBIN A1C
Hgb A1c MFr Bld: 4.8 % (ref 4.8–5.6)
Mean Plasma Glucose: 91.06 mg/dL

## 2023-04-04 MED ORDER — ESCITALOPRAM OXALATE 5 MG PO TABS: 5.0000 mg | ORAL_TABLET | Freq: Every day | ORAL | 0 refills | Status: DC

## 2023-04-04 MED ORDER — DICYCLOMINE HCL 10 MG PO CAPS: 10.0000 mg | ORAL_CAPSULE | Freq: Three times a day (TID) | ORAL | 0 refills | Status: DC

## 2023-04-04 MED ORDER — MIRTAZAPINE 30 MG PO TABS: 30.0000 mg | ORAL_TABLET | Freq: Every day | ORAL | 0 refills | Status: DC

## 2023-04-04 NOTE — Progress Notes (Signed)
[name]  D/C'd  to [facility] per MD order due patient not eating, and drinking and having diarrhea each time he eats.  Discussed with the patient and all questions fully answered. NP called report to [facility].   An After Visit Summary was printed and given to the patient. Patient received prescription papers to collect his medication from the pharmacy.  D/c education completed with patient including follow up instruction. Patient verbalized understanding of D/C education given to him. Safe transport called to transport patient to the ED with [facility] escort. Melvenia Needles 04/04/2023 6:57 PM

## 2023-04-04 NOTE — Discharge Summary (Signed)
Physician Discharge Summary Note  Patient:  [name] is an 58 y.o., male MRN:  [id number] DOB:  12/22/1965 Patient phone:  [phone] (home)  Patient address:   [address] [place] [address],  Total Time spent with patient: 45 minutes  Date of Admission:  03/28/2023 Date of Discharge: 04/04/2023  Reason for Admission:Patient is a 58 year old Caucasian male with a reported prior mental health diagnoses of MDD & GAD who presented to the [facility] [address] ER with complaints of SI with no plan & worsening anxiety.  While at the ER, patient reported "that he takes Klonopin 0.5 mg 2-3 times daily and only has 10 more pills...currently, in ED he has no SI nor plan for suicide. He denies CP, SOB, taking extra medication today." (Per ER documentation). However, in spite of denying suicidal ideation, he was transferred to the [facility] behavioral health hospital on 1/27, where he was voluntarily admitted for treatment and stabilization of his mental status.   Hospital Course: During the patient's hospitalization, patient had extensive initial psychiatric evaluation, and follow-up psychiatric evaluations every day.  During initial Psychiatry evaluation, patient, denied suicidal ideation, denied that he was feeling suicidal prior to being transferred to this behavioral health Hospital, reported that he had gone to the [facility] because he was feeling very anxious, and that he was about to run out of his Klonopin.  He reported feeling very depressed, but cited his Crohn's disease as being the reason for his depressive symptoms. Pt reported that his Chrohn's rendered him unable to eat or drink fluids, & that he was persistently in pain, and was on Klonopin for the pain, which he had been taking for several years. The PDMP was reviewed, & Klonopin has not been ordered to him since August 2024.   Throughout pt's hospitalization, pt remained apathetic & dysphoric, presented with  multiple somatic complaints, complaining about not being able to eat due to anticipating that the food will upset his stomach. Pt was offered the option to have his family bring food for him at the hospital, but declined.   Psychiatric diagnoses provided upon initial assessment: MDD Patient was initially resistant to starting any medications for management of depressive symptoms, was focused on getting Klonopin, and was educated that we would not be prescribing this medication for him during this hospitalization.  He was eventually receptive to medication management and was prescribed Remeron, which was titrated upwards to 30 mg nightly for depressive symptoms & appetite stimulation at time of discharge. Lexapro was also prescribed for depressive symptoms & GAD.  Patient has been discharged from Psychiatry care with the medications as listed above.  The patient denies having side effects to prescribed psychiatric medication. The patient was evaluated each day by a clinical provider to ascertain response to treatment. Improvement was noted by the patient's report of decreasing symptoms, affect, medication tolerance. However, pt remained somatic, refused participation in unit programming & activities, seemed disinterested and motivated in tasks meant to improve upon his mental health, and remained focused on his Crohn's disease.    On day of discharge, the patient reported to this writer that he reported to the night start that he was feeling suicidal "only because that was the only way I was going to get attention. I am not eating and I am not drinking because of my Crohn's. I feel weak. I need an IV. I need to go to the hospital. I don't need mental help. I am not suicidal."  Patient  denies suicidal ideations, denies intent or plan to harm self or any one else at time of discharge from the behavioral health hospital. He denies AVH, denies paranoia or delusional thinking.  Patient is being discharged  from the Psychiatry service and from the behavioral health hospital, and to the ER for medical evaluation as per his request, and due to Sodium level trending downwards (131). Pt complaining of lethargy, difficulty with ambulation, generalized malaise. He was at [facility] for 7 days, has been cleared by Psychiatry, has mental health appointments in the community in place, and can be discharged home after medical treatment. Pt does not need to return to [facility] after medical treatment. Brother was notified by CSW that pt has been sent to the ER as per his request for medical evaluation, and brother also notified that he was been cleared by Psychiatry. See mental health appointment below:  Labs were reviewed with the patient, and abnormal results were discussed with the patient.  The patient is able to verbalize their individual safety plan to this provider.  # It is recommended to the patient to continue psychiatric medications as prescribed, after discharge from the hospital.    # It is recommended to the patient to follow up with your outpatient psychiatric provider and PCP.  # It was discussed with the patient, the impact of alcohol, drugs, tobacco have been there overall psychiatric and medical wellbeing, and total abstinence from substance use was recommended the patient.ed.  # Prescriptions provided to pt at discharge. Patient agreeable to plan. Given opportunity to ask questions. Appears to feel comfortable with discharge.    # In the event of worsening symptoms, the patient is instructed to call the crisis hotline (988), 911 and or go to the nearest ED for appropriate evaluation and treatment of symptoms. To follow-up with primary care provider for other medical issues, concerns and or health care needs  # Patient was discharged with a plan to follow up as noted below.   Principal Problem: MDD (major depressive disorder), recurrent severe, without psychosis ([name]) Discharge Diagnoses: Principal  Problem:   MDD (major depressive disorder), recurrent severe, without psychosis ([name]) Active Problems:   Insomnia   GAD (generalized anxiety disorder)   Crohn's disease ([name])   Past Psychiatric History: See H & P  Past Medical History:  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 06/15/2018   Cancer of skin of neck    Cataract    Chronic mesenteric ischemia ([name])    Chronic pain syndrome 07/09/2016   Crohn's disease of small and large intestines ([name])    followed by dr Baldo Ash gessner   Dairy product intolerance    Diarrhea, functional    Family history of adverse reaction to anesthesia    Grover's disease    transient acantholytic dermatosis   History of alcohol abuse    History of basal cell carcinoma excision    2013 left leg   History of Clostridium difficile    10/ 2014   History of multiple concussions    x6   last one Jan 2017 per pt--  no residual   History of substance abuse ([name])    quit 1997 per pt   History of  suicide attempt    05-18-2012  overdose /  failure to thrive   Iron deficiency anemia due to chronic blood loss    Major depression, recurrent, chronic ([name])    Osteopenia    Personal history of adenomatous colonic polyps 12/2010, 03/2012   12/2010 - 8 mm serrated adenoma of rectum   Portal vein thrombosis 03/21/2015   right   Primary sclerosing cholangitis    ? 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   Substance abuse ([name]) 1997   Alcohol   Vitamin A deficiency 05/23/2018   Vitamin B6 deficiency 07/27/2018   Vitamin C deficiency 05/23/2018    Past Surgical History:  Procedure Laterality Date   ABDOMINAL AORTAGRAM N/A 03/29/2012   Procedure: ABDOMINAL  Ronny Flurry;  Surgeon: Nada Libman, MD;  Location: [facility] CATH LAB;  Service: Cardiovascular;  Laterality: N/A;   ADENOIDECTOMY  age 32   BIOPSY  05/31/2018   Procedure: BIOPSY;  Surgeon: Rachael Fee, MD;  Location: [facility] ENDOSCOPY;  Service: Endoscopy;;   CATARACT EXTRACTION W/ INTRAOCULAR LENS  IMPLANT, BILATERAL  2009   COLONOSCOPY  2001, 05/02/2003, 01/28/11   2012: Right colon Crohn's, rectal polyp   COLONOSCOPY  03/31/2012   Procedure: COLONOSCOPY;  Surgeon: Beverley Fiedler, MD;  Location: [facility] ENDOSCOPY;  Service: Gastroenterology;  Laterality: N/A;   COLONOSCOPY WITH PROPOFOL N/A 05/31/2018   Procedure: COLONOSCOPY WITH PROPOFOL;  Surgeon: Rachael Fee, MD;  Location: [facility] ENDOSCOPY;  Service: Endoscopy;  Laterality: N/A;   ESOPHAGOGASTRODUODENOSCOPY  01/28/2011   Normal   FOOT SURGERY Right age 31   MOHS SURGERY Left 11/2013   left ankle parakerotosis    PERCUTANEOUS LIVER BIOPSY  2007 and 2008   PILONIDAL CYST EXCISION  age 46   PLACEMENT OF SETON N/A 11/06/2015   Procedure: PLACEMENT OF SETON;  Surgeon: Romie Levee, MD;  Location: [facility];  Service: General;  Laterality: N/A;   PLACEMENT OF SETON  07/2018   at [facility]   RECTAL EXAM UNDER ANESTHESIA N/A 01/18/2019   Procedure: ANAL EXAM UNDER ANESTHESIA,  INCISION AND DRAINAGE, SETON PLACEMENT;  Surgeon: Romie Levee, MD;  Location: [facility];  Service: General;  Laterality: N/A;   UPPER GASTROINTESTINAL ENDOSCOPY     Family History:  Family History  Problem Relation Age of Onset   Heart disease Father    Thyroid cancer Father    Allergies Father    Clotting disorder Father    Breast cancer Mother    Stomach cancer Mother    Colon cancer Neg Hx    Esophageal cancer Neg Hx    Rectal cancer Neg Hx    Family Psychiatric  History: See H & P Social History:  Social History   Substance and Sexual Activity  Alcohol Use No   Alcohol/week: 0.0 standard drinks of alcohol     Social  History   Substance and Sexual Activity  Drug Use Not Currently   Types: Cocaine, Marijuana   Comment: QUIT USING DRUGS IN 1997    Social History   Socioeconomic History   Marital status: Single    Spouse name: Not on file   Number of children: 1   Years of education: Not on file   Highest education level: Not on file  Occupational History   Occupation: Disability   Tobacco Use   Smoking status: Former    Current packs/day: 0.00  Average packs/day: 1 pack/day for 15.0 years (15.0 ttl pk-yrs)    Types: Cigarettes    Start date: 03/28/1982    Quit date: 03/28/1997    Years since quitting: 26.0   Smokeless tobacco: Never  Vaping Use   Vaping status: Never Used  Substance and Sexual Activity   Alcohol use: No    Alcohol/week: 0.0 standard drinks of alcohol   Drug use: Not Currently    Types: Cocaine, Marijuana    Comment: QUIT USING DRUGS IN 1997   Sexual activity: Not Currently  Other Topics Concern   Not on file  Social History Narrative   Single, history of substance abuse in recovery   Previously occupied on medical disability   1 child  Daughter Wellsite geologist, lives and works in [place]   Elderly parents involved and he helps them   1 brother   Social Drivers of Corporate investment banker Strain: Low Risk  (08/28/2019)   Overall Financial Resource Strain (CARDIA)    Difficulty of Paying Living Expenses: Not hard at all  Food Insecurity: No Food Insecurity (03/28/2023)   Hunger Vital Sign    Worried About Running Out of Food in the Last Year: Never true    Ran Out of Food in the Last Year: Never true  Transportation Needs: No Transportation Needs (03/28/2023)   PRAPARE - Administrator, Civil Service (Medical): No    Lack of Transportation (Non-Medical): No  Physical Activity: Insufficiently Active (11/08/2019)   Exercise Vital Sign    Days of Exercise per Week: 3 days    Minutes of Exercise per Session: 30 min  Stress: Stress Concern Present  (08/28/2019)   [employer] of Occupational Health - Occupational Stress Questionnaire    Feeling of Stress : Very much  Social Connections: Socially Isolated (03/28/2023)   Social Connection and Isolation Panel [NHANES]    Frequency of Communication with Friends and Family: Three times a week    Frequency of Social Gatherings with Friends and Family: Twice a week    Attends Religious Services: Never    Database administrator or Organizations: No    Attends Engineer, structural: Never    Marital Status: Divorced   Physical Findings: AIMS:0 CIWA:  CIWA-Ar Total: 9 COWS:  COWS Total Score: 5  Musculoskeletal: Strength & Muscle Tone: within normal limits Gait & Station: normal Patient leans: N/A   Psychiatric Specialty Exam:  Presentation  General Appearance:  Disheveled  Eye Contact: Good  Speech: Clear and Coherent  Speech Volume: Normal  Handedness: Right   Mood and Affect  Mood: Depressed  Affect: Congruent   Thought Process  Thought Processes: Coherent  Descriptions of Associations:Intact  Orientation:Full (Time, Place and Person)  Thought Content:Logical; WDL  History of Schizophrenia/Schizoaffective disorder:No data recorded Duration of Psychotic Symptoms:No data recorded Hallucinations:Hallucinations: None  Ideas of Reference:None  Suicidal Thoughts:Suicidal Thoughts: No  Homicidal Thoughts:Homicidal Thoughts: No   Sensorium  Memory: Immediate Good  Judgment: Good  Insight: Good   Executive Functions  Concentration: Good  Attention Span: Good  Recall: Good  Fund of Knowledge: Good  Language: Good   Psychomotor Activity  Psychomotor Activity:Psychomotor Activity: Normal   Assets  Assets: Resilience   Sleep  Sleep:Sleep: Fair   Physical Exam: Physical Exam Constitutional:      Appearance: He is ill-appearing.  Neurological:     Mental Status: He is oriented to person, place, and time.     Review of Systems  Constitutional:  Positive for malaise/fatigue.  Psychiatric/Behavioral:  Positive for depression (Denies SI/HI, denies intent or plan to harm self or others). Negative for hallucinations, memory loss, substance abuse and suicidal ideas. The patient is nervous/anxious and has insomnia.    Blood pressure 104/64, pulse 86, temperature 99.3 F (37.4 C), temperature source Oral, resp. rate 17, height 6\' 1"  (1.854 m), weight 68.5 kg, SpO2 97%. Body mass index is 19.92 kg/m.   Social History   Tobacco Use  Smoking Status Former   Current packs/day: 0.00   Average packs/day: 1 pack/day for 15.0 years (15.0 ttl pk-yrs)   Types: Cigarettes   Start date: 03/28/1982   Quit date: 03/28/1997   Years since quitting: 26.0  Smokeless Tobacco Never   Tobacco Cessation:  N/A, patient does not currently use tobacco products  Blood Alcohol level:  Lab Results  Component Value Date   ETH <10 03/28/2023   ETH <10 08/26/2018    Metabolic Disorder Labs:  Lab Results  Component Value Date   HGBA1C 4.8 04/03/2023   MPG 91.06 04/03/2023   No results found for: "PROLACTIN" Lab Results  Component Value Date   CHOL 209 (H) 04/03/2023   TRIG 75 04/03/2023   HDL 65 04/03/2023   CHOLHDL 3.2 04/03/2023   VLDL 15 04/03/2023   LDLCALC 129 (H) 04/03/2023    See Psychiatric Specialty Exam and Suicide Risk Assessment completed by Attending Physician prior to discharge.  Discharge destination:  Home  Is patient on multiple antipsychotic therapies at discharge:  No   Has Patient had three or more failed trials of antipsychotic monotherapy by history:  No  Recommended Plan for Multiple Antipsychotic Therapies: NA  Discharge Instructions     Diet - low sodium heart healthy   Complete by: As directed    Discharge instructions   Complete by: As directed    Transfer to the ER for evaluation of low Na, and complaints of decreased energy levels and lethargy   Increase activity  slowly   Complete by: As directed    Discharge patient to the ER, and discharge from Psychiatry service. Needs a medical evaluation. Na currently low at 131, not eating, complaining of lethargy & weakness.      Allergies as of 04/04/2023       Reactions   Asacol [mesalamine] Diarrhea   abd pain   Azathioprine Diarrhea   abd pain   Gluten Meal Diarrhea   Metoprolol Tartrate Nausea Only   Stomach upset   Mycophenolate Mofetil Diarrhea   abd pain   Other Other (See Comments)   NUTS; constipation   Wheat Diarrhea   Constipation; flatulence; abd pain   Amoxicillin Other (See Comments)   Lots of gas   Tape Itching, Rash   Please use "paper" tape        Medication List     STOP taking these medications    clonazePAM 0.5 MG tablet Commonly known as: KLONOPIN   Entyvio 300 MG injection Generic drug: vedolizumab   vitamin C 1000 MG tablet       TAKE these medications      Indication  dicyclomine 10 MG capsule Commonly known as: BENTYL Take 1 capsule (10 mg total) by mouth 3 (three) times daily before meals.  Indication: Irritable Bowel Syndrome   escitalopram 5 MG tablet Commonly known as: LEXAPRO Take 1 tablet (5 mg total) by mouth at bedtime.  Indication: Generalized Anxiety Disorder, Major Depressive Disorder   mirtazapine 30 MG tablet Commonly  known as: REMERON Take 1 tablet (30 mg total) by mouth at bedtime.  Indication: Major Depressive Disorder   predniSONE 20 MG tablet Commonly known as: DELTASONE Take 1.5 tablets (30 mg total) by mouth daily with breakfast.  Indication: Crohns        Follow-up Information     Services, [facility]. Go on 04/06/2023.   Why: You have a hospital follow up appointment for group therapy and medication management services on 04/06/23 at 9:00 am .  The appointment will be held in person. Contact information: [address] [place] [address] [phone]                Signed: Starleen Blue,  NP 04/04/2023, 2:38 PM

## 2023-04-04 NOTE — Plan of Care (Signed)
  Problem: Education: Goal: Emotional status will improve Outcome: Not Progressing   Problem: Education: Goal: Mental status will improve Outcome: Not Progressing

## 2023-04-04 NOTE — Progress Notes (Signed)
   04/04/23 0530  15 Minute Checks  Location Bedroom  Visual Appearance Calm  Behavior Composed  Sleep (Behavioral Health Patients Only)  Calculate sleep? (Click Yes once per 24 hr at 0600 safety check) Yes  Documented sleep last 24 hours 3.25

## 2023-04-04 NOTE — BH IP Treatment Plan (Signed)
Interdisciplinary Treatment and Diagnostic Plan Update  04/04/2023 Time of Session: 1026 - UPDATE [name] MRN: [id number]  Principal Diagnosis: MDD (major depressive disorder), recurrent severe, without psychosis ([name])  Secondary Diagnoses: Principal Problem:   MDD (major depressive disorder), recurrent severe, without psychosis ([name]) Active Problems:   Insomnia   GAD (generalized anxiety disorder)   Crohn's disease ([name])   Current Medications:  Current Facility-Administered Medications  Medication Dose Route Frequency Provider Last Rate Last Admin   acetaminophen (TYLENOL) tablet 650 mg  650 mg Oral Q6H PRN Starleen Blue, NP   650 mg at 04/03/23 2058   alum & mag hydroxide-simeth (MAALOX/MYLANTA) 200-200-20 MG/5ML suspension 30 mL  30 mL Oral Q4H PRN Onuoha, Chinwendu V, NP   30 mL at 04/02/23 1702   dicyclomine (BENTYL) capsule 10 mg  10 mg Oral TID AC Nkwenti, Doris, NP   10 mg at 04/04/23 9629   diphenhydrAMINE (BENADRYL) capsule 25 mg  25 mg Oral QHS PRN Ajibola, Ene A, NP   25 mg at 04/04/23 0056   haloperidol (HALDOL) tablet 5 mg  5 mg Oral TID PRN Onuoha, Chinwendu V, NP   5 mg at 04/04/23 5284   And   diphenhydrAMINE (BENADRYL) capsule 50 mg  50 mg Oral TID PRN Onuoha, Chinwendu V, NP   50 mg at 04/01/23 0238   haloperidol lactate (HALDOL) injection 5 mg  5 mg Intramuscular TID PRN Onuoha, Chinwendu V, NP       And   diphenhydrAMINE (BENADRYL) injection 50 mg  50 mg Intramuscular TID PRN Onuoha, Chinwendu V, NP       And   LORazepam (ATIVAN) injection 2 mg  2 mg Intramuscular TID PRN Onuoha, Chinwendu V, NP       haloperidol lactate (HALDOL) injection 10 mg  10 mg Intramuscular TID PRN Onuoha, Chinwendu V, NP       And   diphenhydrAMINE (BENADRYL) injection 50 mg  50 mg Intramuscular TID PRN Onuoha, Chinwendu V, NP       And   LORazepam (ATIVAN) injection 2 mg  2 mg Intramuscular TID PRN Onuoha, Chinwendu V, NP       escitalopram (LEXAPRO) tablet 5 mg  5 mg  Oral QHS Izediuno, Vincent A, MD   5 mg at 04/03/23 2058   hydrOXYzine (ATARAX) tablet 10 mg  10 mg Oral TID PRN Georgiann Cocker, MD   10 mg at 04/04/23 0359   magnesium hydroxide (MILK OF MAGNESIA) suspension 30 mL  30 mL Oral Daily PRN Onuoha, Chinwendu V, NP       mirtazapine (REMERON) tablet 30 mg  30 mg Oral QHS Izediuno, Vincent A, MD   30 mg at 04/03/23 2058   ondansetron (ZOFRAN-ODT) disintegrating tablet 4 mg  4 mg Oral Q6H PRN Starleen Blue, NP   4 mg at 04/04/23 0059   predniSONE (DELTASONE) tablet 30 mg  30 mg Oral Q breakfast Starleen Blue, NP   30 mg at 04/04/23 1324   PTA Medications: Medications Prior to Admission  Medication Sig Dispense Refill Last Dose/Taking   Ascorbic Acid (VITAMIN C) 1000 MG tablet Take 1,000 mg by mouth daily.   Taking   predniSONE (DELTASONE) 20 MG tablet Take 1.5 tablets (30 mg total) by mouth daily with breakfast. 100 tablet 2 Taking   vedolizumab (ENTYVIO) 300 MG injection Inject 300 mg into the vein every 6 (six) weeks. 1 each 9 Taking   clonazePAM (KLONOPIN) 0.5 MG tablet Take 0.5 mg by  mouth 2 (two) times daily.       Patient Stressors: Medication change or noncompliance    Patient Strengths: Supportive family/friends   Treatment Modalities: Medication Management, Group therapy, Case management,  1 to 1 session with clinician, Psychoeducation, Recreational therapy.   Physician Treatment Plan for Primary Diagnosis: MDD (major depressive disorder), recurrent severe, without psychosis ([name]) Long Term Goal(s): Improvement in symptoms so as ready for discharge   Short Term Goals: Ability to identify changes in lifestyle to reduce recurrence of condition will improve Ability to verbalize feelings will improve Ability to disclose and discuss suicidal ideas Ability to demonstrate self-control will improve Ability to identify and develop effective coping behaviors will improve Ability to maintain clinical measurements within normal limits  will improve Compliance with prescribed medications will improve  Medication Management: Evaluate patient's response, side effects, and tolerance of medication regimen.  Therapeutic Interventions: 1 to 1 sessions, Unit Group sessions and Medication administration.  Evaluation of Outcomes: Progressing  Physician Treatment Plan for Secondary Diagnosis: Principal Problem:   MDD (major depressive disorder), recurrent severe, without psychosis ([name]) Active Problems:   Insomnia   GAD (generalized anxiety disorder)   Crohn's disease ([name])  Long Term Goal(s): Improvement in symptoms so as ready for discharge   Short Term Goals: Ability to identify changes in lifestyle to reduce recurrence of condition will improve Ability to verbalize feelings will improve Ability to disclose and discuss suicidal ideas Ability to demonstrate self-control will improve Ability to identify and develop effective coping behaviors will improve Ability to maintain clinical measurements within normal limits will improve Compliance with prescribed medications will improve     Medication Management: Evaluate patient's response, side effects, and tolerance of medication regimen.  Therapeutic Interventions: 1 to 1 sessions, Unit Group sessions and Medication administration.  Evaluation of Outcomes: Progressing   RN Treatment Plan for Primary Diagnosis: MDD (major depressive disorder), recurrent severe, without psychosis ([name]) Long Term Goal(s): Knowledge of disease and therapeutic regimen to maintain health will improve  Short Term Goals: Ability to remain free from injury will improve, Ability to demonstrate self-control, Ability to verbalize feelings will improve, and Ability to disclose and discuss suicidal ideas  Medication Management: RN will administer medications as ordered by provider, will assess and evaluate patient's response and provide education to patient for prescribed medication. RN will report any  adverse and/or side effects to prescribing provider.  Therapeutic Interventions: 1 on 1 counseling sessions, Psychoeducation, Medication administration, Evaluate responses to treatment, Monitor vital signs and CBGs as ordered, Perform/monitor CIWA, COWS, AIMS and Fall Risk screenings as ordered, Perform wound care treatments as ordered.  Evaluation of Outcomes: Progressing   LCSW Treatment Plan for Primary Diagnosis: MDD (major depressive disorder), recurrent severe, without psychosis ([name]) Long Term Goal(s): Safe transition to appropriate next level of care at discharge, Engage patient in therapeutic group addressing interpersonal concerns.  Short Term Goals: Engage patient in aftercare planning with referrals and resources, Increase ability to appropriately verbalize feelings, Increase emotional regulation, Identify triggers associated with mental health/substance abuse issues, and Increase skills for wellness and recovery  Therapeutic Interventions: Assess for all discharge needs, 1 to 1 time with Social worker, Explore available resources and support systems, Assess for adequacy in community support network, Educate family and significant other(s) on suicide prevention, Complete Psychosocial Assessment, Interpersonal group therapy.  Evaluation of Outcomes: Progressing   Progress in Treatment: Attending groups: No. Participating in groups: No. Taking medication as prescribed: No. Toleration medication: No. Family/Significant other contact made: Yes,  individual(s) contacted:  Brother, Feliberto Harts  Patient understands diagnosis: No. and As evidenced by:  verbalized lack of insight regarding anxiety symptoms  Discussing patient identified problems/goals with staff: Yes. Medical problems stabilized or resolved: Yes. Denies suicidal/homicidal ideation: Yes. Issues/concerns per patient self-inventory: No. Other: Previously ambivalent about treatment, therefore he was not taking any  medications. After meeting with CSW and Dr. Is agreeable to starting medication.   New problem(s) identified: No, Describe:  None   New Short Term/Long Term Goal(s): medication stabilization, elimination of SI thoughts, development of comprehensive mental wellness plan.     Patient Goals:  "I want to try to live"   Discharge Plan or Barriers: Patient recently admitted. CSW will continue to follow and assess for appropriate referrals and possible discharge planning.      Reason for Continuation of Hospitalization: Anxiety Depression Medication stabilization   Estimated Length of Stay: 3-5 Days    Last 3 [place] Suicide Severity Risk Score: Flowsheet Row [facility] (Current[facility] Most recent reading at 03/28/2023 10:42 PM ED from 03/28/2023 in [facility] Emergency Department at [facility] Most recent reading at 03/28/2023  9:09 AM ED from 11/03/2022 in [facility] Emergency Department at [facility] Most recent reading at 11/03/2022 12:59 PM  C-SSRS RISK CATEGORY Low Risk Low Risk No Risk       Last PHQ 2/9 Scores:    06/06/2020   11:06 AM 03/06/2020    2:49 PM 08/28/2019    1:04 PM  Depression screen PHQ 2/9  Decreased Interest 0 0 0  Down, Depressed, Hopeless 0 1 1  PHQ - 2 Score 0 1 1  Altered sleeping 1 2 3   Tired, decreased energy 1 2 3   Change in appetite 1 1 1   Feeling bad or failure about yourself  0 0 0  Trouble concentrating 1 2 3   Moving slowly or fidgety/restless 1 0 0  Suicidal thoughts 0 0 0  PHQ-9 Score 5 8 11   Difficult doing work/chores   Somewhat difficult    Scribe for Treatment Team: Jacinta Shoe, LCSW 04/04/2023 10:26 AM

## 2023-04-04 NOTE — Progress Notes (Signed)
Nursing 1:1 note: Patient is in the room at this time  with 1:1 staff. Patient refused to come out of the room, states he's not feeling good. Staff offered him food and drink, but he only took a bit of the food. No acute distress noted at this time. Staff will continue to provide support to patient.

## 2023-04-04 NOTE — Progress Notes (Signed)
  [facility] Adult Case Management Discharge Plan :  Will you be returning to the same living situation after discharge:  Yes,  home w/ parents At discharge, do you have transportation home?: No. Do you have the ability to pay for your medications: Yes,  Medicare coverage  Release of information consent forms completed and in the chart;  Patient's signature needed at discharge.  Patient to Follow up at:  Follow-up Information     [facility]. Go on 04/06/2023.   Why: You have a hospital follow up appointment for group therapy and medication management services on 04/06/23 at 9:00 am .  The appointment will be held in person. Contact information: [address] [place] [address] [phone]                 Next level of care provider has access to [facility] Link:no  Safety Planning and Suicide Prevention discussed: Yes,  SPE completed with brother Nash Bolls as well as brother Elijah Birk was notified of Pt's discharge & plan on 04/04/2023 at 1352 . Discussed follow-up recommendations with brother and made him aware that Pt was discharged to the ER.     Has patient been referred to the Quitline?: Patient does not use tobacco/nicotine products  Patient has been referred for addiction treatment: No known substance use disorder.  Joelyn Oms Shakur Lembo, LCSW 04/04/2023, 1:53 PM

## 2023-04-04 NOTE — ED Triage Notes (Signed)
Patient is here for evaluation of dehydration. Reports that he has a history of severe chrons disease. States anytime he eats he has diarrhea. Was just discharged from behavioral health and had them bring him over here.

## 2023-04-04 NOTE — [facility] Suicide Risk Assessment (Signed)
Suicide Risk Assessment  Discharge Assessment    [facility] Discharge Suicide Risk Assessment   Principal Problem: MDD (major depressive disorder), recurrent severe, without psychosis ([name]) Discharge Diagnoses: Principal Problem:   MDD (major depressive disorder), recurrent severe, without psychosis ([name]) Active Problems:   Insomnia   GAD (generalized anxiety disorder)   Crohn's disease ([name])  Reason for admission: Patient is a 58 year old Caucasian male with a reported prior mental health diagnoses of MDD & GAD who presented to the [facility] [address] ER with complaints of SI with no plan & worsening anxiety.  While at the ER, patient reported "that he takes Klonopin 0.5 mg 2-3 times daily and only has 10 more pills...currently, in ED he has no SI nor plan for suicide. He denies CP, SOB, taking extra medication today." (Per ER documentation). However, in spite of denying suicidal ideation, he was transferred to this hospital on 1/27, where he was voluntarily admitted for treatment and stabilization of his mental status.   Total Time spent with patient: 45 minutes  Musculoskeletal: Strength & Muscle Tone: within normal limits Gait & Station: normal Patient leans: N/A  Psychiatric Specialty Exam  Presentation  General Appearance:  Disheveled  Eye Contact: Minimal  Speech: Clear and Coherent  Speech Volume: Decreased  Handedness: Right   Mood and Affect  Mood: Depressed  Duration of Depression Symptoms: No data recorded Affect: Congruent   Thought Process  Thought Processes: Coherent  Descriptions of Associations:Intact  Orientation:Full (Time, Place and Person)  Thought Content:Logical  History of Schizophrenia/Schizoaffective disorder:No data recorded Duration of Psychotic Symptoms:No data recorded Hallucinations:No data recorded Ideas of Reference:None  Suicidal Thoughts:No data recorded Homicidal Thoughts:No data recorded  Sensorium   Memory: Immediate Fair  Judgment: Fair  Insight: Fair   Art therapist  Concentration: Fair  Attention Span: Fair  Recall: [employer] of Knowledge: Fair  Language: Fair   Psychomotor Activity  Psychomotor Activity:No data recorded  Assets  Assets: Resilience  Sleep  Sleep:No data recorded  Physical Exam: Physical Exam Constitutional:      Appearance: He is ill-appearing.  Neurological:     Mental Status: He is alert.    Review of Systems  Psychiatric/Behavioral:  Positive for depression (Denies SI/HI, denies plan or intent to harm self or others). Negative for hallucinations, memory loss, substance abuse and suicidal ideas. The patient is nervous/anxious (stable for discharge) and has insomnia.   All other systems reviewed and are negative.  Blood pressure 104/64, pulse 86, temperature 99.3 F (37.4 C), temperature source Oral, resp. rate 17, height 6\' 1"  (1.854 m), weight 68.5 kg, SpO2 97%. Body mass index is 19.92 kg/m.  Mental Status Per Nursing Assessment::   On Admission:  Self-harm thoughts  Demographic Factors:  Male and Low socioeconomic status  Loss Factors: Decline in physical health  Historical Factors: Prior suicide attempts  Risk Reduction Factors:   Sense of responsibility to family  Continued Clinical Symptoms:  Previous Psychiatric Diagnoses and Treatments  Cognitive Features That Contribute To Risk:  None    Suicide Risk:  Mild:  There are no identifiable suicide plans, no associated intent, mild dysphoria and related symptoms, good self-control (both objective and subjective assessment), few other risk factors, and identifiable protective factors, including available and accessible social support.    Follow-up Information     [facility]. Go on 04/06/2023.   Why: You have a hospital follow up appointment for group therapy and medication management services on 04/06/23 at 9:00 am .  The appointment will  be held in person. Contact information: [address] [place] [address] [phone]               Starleen Blue, NP 04/04/2023, 1:15 PM

## 2023-04-04 NOTE — Group Note (Signed)
Recreation Therapy Group Note   Group Topic:Stress Management  Group Date: 04/04/2023 Start Time: 0935 End Time: 1000 Facilitators: Zebastian Carico-McCall, LRT,CTRS Location: 300 Hall Dayroom   Group Topic: Stress Management  Goal Area(s) Addresses:  Patient will identify positive stress management techniques. Patient will identify benefits of using stress management post d/c.  Intervention: Insight Timer App  Activity : Meditation. LRT engaged with patients about meditation and the benefits of it. LRT then played a meditation that focused on getting prepared for the day and being able to speak positive affirmations to themselves when negative thoughts start to creep in. Patients were encouraged to relax, get in a comfortable position, focus on their breathing and be attentive to the meditation.   Education:  Stress Management, Discharge Planning.   Education Outcome: Acknowledges Education   Affect/Mood: N/A   Participation Level: Did not attend    Clinical Observations/Individualized Feedback:     Plan: Continue to engage patient in RT group sessions 2-3x/week.   [name], LRT,CTRS 04/04/2023 11:30 AM

## 2023-04-04 NOTE — Progress Notes (Signed)
Pt called this writer to room to state that he can no longer contract for safety on the unit.  Pt states.  This Clinical research associate brought Pt agitation medication while Consulting civil engineer spoke with Pt.  Pt told charge RN that he does in fact have a plan to stick his fingers in the light socket.  Pt refused to go to quiet room stating "I don't think I can do that".  NP notified and decision made to place Pt on 1:1

## 2023-04-04 NOTE — Progress Notes (Signed)
Nursing 1:1 Note  D.  Pt resting in bed awake, no acute distress noted.    A.  1:1 continued as ordered for Pt safetly  R.  Pt remains safe

## 2023-04-04 NOTE — Plan of Care (Signed)
   Problem: Coping: Goal: Ability to verbalize frustrations and anger appropriately will improve Outcome: Progressing   Problem: Safety: Goal: Periods of time without injury will increase Outcome: Progressing   Problem: Activity: Goal: Interest or engagement in activities will improve Outcome: Progressing

## 2023-04-04 NOTE — Progress Notes (Signed)
Pt up and pacing in room.  Pt requested medications stating he did not "feel right".  Pt given Benadryl 25 mg PRN as well as zofran for nausea.  VSS other than some tachycardia (see VS flow sheet).  Pt states medication will not help, explained to Pt he must give it time.  Pt continues pacing in room, will continue to monitor.

## 2023-04-11 ENCOUNTER — Ambulatory Visit (HOSPITAL_COMMUNITY)
Admission: EM | Admit: 2023-04-11 | Discharge: 2023-04-11 | Disposition: A | Discharge status: Home / Self Care | Payer: Medicare Other | Attending: Psychiatry | Admitting: Psychiatry

## 2023-04-11 DIAGNOSIS — R45851 Suicidal ideations: Secondary | ICD-10-CM | POA: Diagnosis not present

## 2023-04-11 DIAGNOSIS — I10 Essential (primary) hypertension: Secondary | ICD-10-CM | POA: Diagnosis not present

## 2023-04-11 DIAGNOSIS — F411 Generalized anxiety disorder: Secondary | ICD-10-CM | POA: Diagnosis not present

## 2023-04-11 DIAGNOSIS — K5090 Crohn's disease, unspecified, without complications: Secondary | ICD-10-CM | POA: Diagnosis not present

## 2023-04-11 DIAGNOSIS — Z79899 Other long term (current) drug therapy: Secondary | ICD-10-CM | POA: Diagnosis not present

## 2023-04-11 MED ORDER — BUSPIRONE HCL 10 MG PO TABS: 10.0000 mg | ORAL_TABLET | Freq: Two times a day (BID) | ORAL | 0 refills | Status: DC

## 2023-04-11 MED ORDER — BUSPIRONE HCL 10 MG PO TABS
10.0000 mg | ORAL_TABLET | Freq: Two times a day (BID) | ORAL | Status: DC
  Administered 2023-04-11: 10 mg via ORAL
  Filled 2023-04-11: qty 1

## 2023-04-11 MED ORDER — ESCITALOPRAM OXALATE 10 MG PO TABS: 10.0000 mg | ORAL_TABLET | Freq: Every day | ORAL | 0 refills | Status: DC

## 2023-04-11 NOTE — ED Provider Notes (Addendum)
Behavioral Health Urgent Care Medical Screening Exam  Patient Name: [name] MRN: [id number] Date of Evaluation: 04/11/23 Chief Complaint:  requesting medications for anxiety and panic Diagnosis:  Final diagnoses:  GAD (generalized anxiety disorder)    History of Present illness: [name] is a 58 y.o. male patient presented to [facility] as a walk in accompanied by EMS with complaints of "requesting medications for anxiety and panic"  [name], 58 y.o., male patient seen face to face by this provider, chart reviewed, and consulted with Dr. Enedina Finner on 04/11/23.  Per chart review patient has a past psychiatric history of MDD and GAD.  He has a medical history that includes Crohn's disease.  He was recently admitted to [facility] 1/27-2/04/2023.  Upon discharge patient was prescribed Bentyl 10 mg 3 times daily before meals, Lexapro 5 mg nightly, mirtazapine 30 mg nightly, and prednisone 20 mg daily with breakfast.  Patient reports compliance with medications.  He was also given a discharge appointment for medication management at [facility] on 04/06/2023 which he did not make.  He is currently living with his parents in [place].  He denies all substance use.  Part review during patient's admission at [facility] he remained focused on his somatic symptoms and refused to participate in programming and activities.  He was adamant that he had been prescribed Klonopin.  However per PDMP shows patient was prescribed lorazepam but it was last prescribed on 10/2022. He was discharged from the psychiatric service and transferred to the emergency department for medical evaluation at his request.  Patient had to wait too long to be seen while at the emergency department and called his brother to pick him up, he was never seen by emergency room physician.  He returned to his parents home in [place] and states he has continued to be anxious since that time.   Patient presents today via EMS.   Upon assessment he is laying in the stretcher.  He was escorted out of stretcher and into a wheelchair.  He is able to ambulate but reports he is unsteady at times.  He has had no recent falls.  Reports he was in his home and began to feel extremely anxious and short of breath and EMS brought to [facility].  He is currently shaking his wrist back and his feet back and forth. His anxiety has worsened and he finds himself pacing often throughout the day and sometimes at night.  He presents today requesting medications that will help with his anxiety. He has taken Klonopin in the past and it was helpful.  Explained to patient that Klonopin would not be prescribed at this time as Klonopin would need to be prescribed and managed in the outpatient setting.  He then asked if he were to be transferred to the emergency department would they proscribe medications. Explained to patient again that medication would need to be managed in outpatient setting.  After talking with patient his anxiety appeared to subside and he remained calm for a while.  However when discussing outpatient resources he began to move his hands back-and-forth and to have his feet on the floor and states, "I am going to need medicines I do not know how I am going to make it without them". He remains focused on medications throughout assessment.  During evaluation [name] is alert/oriented x 4 cooperative, attentive, and responses were relevant and appropriate to assessment questions.  He spoke in a clear tone at moderate  volume, and normal pace, with good eye contact.  He denies suicidal/homicidal ideations.  He admits to having suicidal ideations before but has never made a suicide attempt.  He denies any current plan, intent, or access .He denies access to firearms/weapons.  He denies auditory/visual hallucinations.  He denies any paranoia or delusional thoughts.  He does not appear psychotic or manic.  He does not appear to be responding to  internal/external stimuli.  Discussed partial hospitalization program (PHP).  Patient states he is too anxious and his handshake he is unable to use a cell phone or computer.  Initially he is not interested in participating in [facility] program.  However he is willing to have program contact him to discuss.  Referral was made.  He admits that he missed his appointment with [facility] on 2/5, reports this is due to having no transportation.  He also contradicts himself by saying that his brother takes him to his other medical appointments.    [name] care Production designer, theatre/television/film provided resources for medication management and therapy.  In addition [facility]  was contacted and an appointment was made for 2/24 at 2 PM with Hillery Jacks, NP for medication management  Discussed adding BuSpar 10 mg twice daily for anxiety and increasing Lexapro from 5 mg daily to Lexapro 10 mg daily for anxiety/depression.  Discussed how to take medications and any adverse reactions.  Patient verbalized understanding and is in agreement.  Call contact patient's brother [name] [phone] with patient's permission.  Has no immediate safety concerns with patient being discharged home.  He is willing to pick patient up upon discharge and take patient to [facility] to pick up his medications.  He is willing to help patient with follow-up appointments and possible PHP program.                                                           At this time KRATOS RUSCITTI is educated and verbalizes understanding of mental health resources and other crisis services in the community.  He is instructed to call 911 and present to the nearest emergency room should he experience any suicidal/homicidal ideation, auditory/visual/hallucinations, or detrimental worsening of his mental health condition.  He was a also advised by Clinical research associate that he could call the toll-free phone on back of  insurance card to assist with identifying counselors and agencies in  network.   Flowsheet Row ED from 04/11/2023 in [facility] ED from 04/04/2023 in [facility] Emergency Department at [facility] Admission (Discharged) [facility]  C-SSRS RISK CATEGORY No Risk No Risk Low Risk       Psychiatric Specialty Exam  Presentation  General Appearance:Disheveled  Eye Contact:Good  Speech:Clear and Coherent; Normal Rate  Speech Volume:Normal  Handedness:Right   Mood and Affect  Mood: Anxious; Depressed  Affect: Congruent   Thought Process  Thought Processes: Coherent  Descriptions of Associations:Intact  Orientation:Full (Time, Place and Person)  Thought Content:Logical    Hallucinations:None  Ideas of Reference:None  Suicidal Thoughts:No  Homicidal Thoughts:No   Sensorium  Memory: Immediate Good; Recent Good; Remote Good  Judgment: Fair  Insight: Fair   Art therapist  Concentration: Good  Attention Span: Good  Recall: Good  Fund of Knowledge: Good  Language: Good   Psychomotor Activity  Psychomotor Activity: Normal   Assets  Assets: Communication Skills; Desire for Improvement; Financial Resources/Insurance; Leisure Time; Housing   Sleep  Sleep: Fair  Number of hours:  6   Physical Exam: Physical Exam Constitutional:      Appearance: Normal appearance.  Eyes:     General:        Right eye: No discharge.        Left eye: No discharge.  Cardiovascular:     Rate and Rhythm: Normal rate.  Pulmonary:     Effort: No respiratory distress.  Musculoskeletal:        General: Normal range of motion.     Cervical back: Normal range of motion.  Neurological:     Mental Status: He is alert and oriented to person, place, and time.  Psychiatric:        Attention and Perception: Perception normal.        Mood and Affect: Mood is anxious and depressed.        Speech: Speech normal.        Behavior:  Behavior normal. Behavior is cooperative.        Thought Content: Thought content normal.        Cognition and Memory: Cognition normal.        Judgment: Judgment normal.    Review of Systems  Constitutional:  Negative for chills and fever.  HENT:  Negative for hearing loss.   Respiratory:  Negative for shortness of breath.   Cardiovascular:  Negative for chest pain.  Gastrointestinal:  Negative for vomiting.  Neurological:  Negative for dizziness, focal weakness and seizures.  Psychiatric/Behavioral:  Positive for depression. The patient is nervous/anxious.    Blood pressure (!) 118/91, pulse 84, temperature (!) 97.5 F (36.4 C), temperature source Oral, resp. rate 18, SpO2 99%. There is no height or weight on file to calculate BMI.  Musculoskeletal: Strength & Muscle Tone: within normal limits Gait & Station: normal Patient leans: N/A   [facility] MSE Discharge Disposition for Follow up and Recommendations: Based on my evaluation the patient does not appear to have an emergency medical condition and can be discharged with resources and follow up care in outpatient services for Medication Management and Individual Therapy  Discharge patient  Appointment made for [facility] services with Hillery Jacks, NP on 04/03/2023 at 2 PM  Referral made to [facility] program-=[facility]  Prescription for BuSpar 10 mg twice daily and Lexapro 10 mg daily sent to patient's pharmacy of choice.  Ardis Hughs, NP 04/11/2023, 3:14 PM

## 2023-04-11 NOTE — Progress Notes (Signed)
   04/11/23 1306  [facility] Triage Screening (Walk-ins at [facility] only)  How Did You Hear About Us ? Self  What Is the Reason for Your Visit/Call Today? Patient is a 58 y.o. male with a history of Anxiety Disorder Unspecified who presents via EMS reporting worsening anxiety symptoms.  Patient is visibly tremulous and SOB on arrival, with EMS reporting they gave patient Oxygen "for comfort."  Patient was d/c from [facility] last Monday, and he states he did not look at d/c paperwork for f/u plan.  He was started on Lexapro  and klonopin  was d/c at [facility].  Patient is asking for medication at this time to treat panic symptoms.  He denies SI, HI, AVH or SA hx.  He lives with elderly parents and gives verbal consent for provider to speak with his brother.  How Long Has This Been Causing You Problems? 1 wk - 1 month  Have You Recently Had Any Thoughts About Hurting Yourself? No  Are You Planning to Commit Suicide/Harm Yourself At This time? No  Have you Recently Had Thoughts About Hurting Someone Marigene Shoulder? No  Are You Planning To Harm Someone At This Time? No  Physical Abuse Denies  Verbal Abuse Denies  Sexual Abuse Denies  Exploitation of patient/patient's resources Denies  Self-Neglect Denies  Are you currently experiencing any auditory, visual or other hallucinations? No  Have You Used Any Alcohol or Drugs in the Past 24 Hours? No  Do you have any current medical co-morbidities that require immediate attention? No  Clinician description of patient physical appearance/behavior: Patient is visibly anxious and SOB on arrival, reporting "extreme anxiety" he is AAOx5.  What Do You Feel Would Help You the Most Today? Treatment for Depression or other mood problem  If access to [facility] Urgent [facility] was not available, would you have sought care in the Emergency Department? No  Determination of Need Routine (7 days)  Options For Referral Intensive Outpatient Therapy;Medication Management;Outpatient Therapy

## 2023-04-11 NOTE — ED Notes (Signed)
 Patient discharged in stable condition with all belongings. Patient denies SI, HI, AVH.

## 2023-04-11 NOTE — Discharge Instructions (Addendum)
..           Medicare Outpatient Therapy and Psychiatry for Recipients   Base on the information you have provided and the presenting issue, outpatient services with therapy and psychiatry have been recommended.  It is imperative that you follow through with treatment recommendations within 5-7 days from the of discharge to mitigate further risk to your safety and mental well-being. A list of referrals has been provided below to get you started.  You are not limited to the list provided.  In case of an urgent crisis, you may contact the Mobile Crisis Unit with [facility] at [phone].   [facility] Outpatient Behavioral Health [address] [place], [place], [address] [phone] phone  [facility] Medicine [address] [place], [place], [address] [address] phone ([address], [employer] [address] - [place], [address], [place], [place], [employer], [address], [employer] Healthy [place], [place], [address], [place], [place], [place], [employer], T[employer], [employer], [employer], [place])  [facility] [address] [place], [place], [address] [phone] phone  [facility] [address] [place], [place], [address] [phone] phone  [facility] [address] [place], [place], [address] [phone] phone [phone] fax  [facility] [place] [address] [place], [place], [address] [phone] phone  [facility] [address] [place], [place], [address] [phone] phone [phone] fax  [facility] [address] [place], [place], [address] [phone] phone  [facility] [address] [place], [place], [address] [phone] phone  [facility] [address] [place], [place], [address] [phone] phone [phone] fax

## 2023-04-13 ENCOUNTER — Inpatient Hospital Stay
Admission: RE | Admit: 2023-04-13 | Discharge: 2023-05-20 | DRG: 885 | Disposition: A | Discharge status: Home / Self Care | Payer: Medicare Other | Source: Intra-hospital | Attending: Psychiatry | Admitting: Psychiatry

## 2023-04-13 ENCOUNTER — Telehealth (HOSPITAL_COMMUNITY): Payer: Self-pay | Admitting: Professional

## 2023-04-13 ENCOUNTER — Emergency Department (HOSPITAL_COMMUNITY)
Admission: EM | Admit: 2023-04-13 | Discharge: 2023-04-13 | Disposition: A | Discharge status: Other Acute Inpatient Hospital | Payer: Medicare Other | Attending: Emergency Medicine | Admitting: Emergency Medicine

## 2023-04-13 ENCOUNTER — Other Ambulatory Visit: Payer: Self-pay

## 2023-04-13 DIAGNOSIS — Z87820 Personal history of traumatic brain injury: Secondary | ICD-10-CM

## 2023-04-13 DIAGNOSIS — T43222A Poisoning by selective serotonin reuptake inhibitors, intentional self-harm, initial encounter: Secondary | ICD-10-CM | POA: Diagnosis present

## 2023-04-13 DIAGNOSIS — K551 Chronic vascular disorders of intestine: Secondary | ICD-10-CM | POA: Diagnosis present

## 2023-04-13 DIAGNOSIS — Z9151 Personal history of suicidal behavior: Secondary | ICD-10-CM

## 2023-04-13 DIAGNOSIS — F332 Major depressive disorder, recurrent severe without psychotic features: Principal | ICD-10-CM | POA: Diagnosis present

## 2023-04-13 DIAGNOSIS — L111 Transient acantholytic dermatosis [Grover]: Secondary | ICD-10-CM | POA: Diagnosis present

## 2023-04-13 DIAGNOSIS — Z5986 Financial insecurity: Secondary | ICD-10-CM

## 2023-04-13 DIAGNOSIS — K5080 Crohn's disease of both small and large intestine without complications: Secondary | ICD-10-CM | POA: Diagnosis present

## 2023-04-13 DIAGNOSIS — E099 Drug or chemical induced diabetes mellitus without complications: Secondary | ICD-10-CM | POA: Diagnosis present

## 2023-04-13 DIAGNOSIS — Z5941 Food insecurity: Secondary | ICD-10-CM | POA: Diagnosis not present

## 2023-04-13 DIAGNOSIS — R000 Tachycardia, unspecified: Secondary | ICD-10-CM | POA: Diagnosis not present

## 2023-04-13 DIAGNOSIS — Z136 Encounter for screening for cardiovascular disorders: Secondary | ICD-10-CM | POA: Diagnosis not present

## 2023-04-13 DIAGNOSIS — E860 Dehydration: Secondary | ICD-10-CM | POA: Diagnosis present

## 2023-04-13 DIAGNOSIS — M8580 Other specified disorders of bone density and structure, unspecified site: Secondary | ICD-10-CM | POA: Diagnosis present

## 2023-04-13 DIAGNOSIS — T50904A Poisoning by unspecified drugs, medicaments and biological substances, undetermined, initial encounter: Secondary | ICD-10-CM | POA: Diagnosis not present

## 2023-04-13 DIAGNOSIS — D509 Iron deficiency anemia, unspecified: Secondary | ICD-10-CM | POA: Diagnosis present

## 2023-04-13 DIAGNOSIS — J320 Chronic maxillary sinusitis: Secondary | ICD-10-CM | POA: Diagnosis present

## 2023-04-13 DIAGNOSIS — K754 Autoimmune hepatitis: Secondary | ICD-10-CM | POA: Diagnosis present

## 2023-04-13 DIAGNOSIS — I10 Essential (primary) hypertension: Secondary | ICD-10-CM | POA: Diagnosis not present

## 2023-04-13 DIAGNOSIS — R270 Ataxia, unspecified: Secondary | ICD-10-CM | POA: Diagnosis present

## 2023-04-13 DIAGNOSIS — T50902A Poisoning by unspecified drugs, medicaments and biological substances, intentional self-harm, initial encounter: Secondary | ICD-10-CM | POA: Diagnosis present

## 2023-04-13 DIAGNOSIS — G4700 Insomnia, unspecified: Secondary | ICD-10-CM | POA: Insufficient documentation

## 2023-04-13 DIAGNOSIS — T43222D Poisoning by selective serotonin reuptake inhibitors, intentional self-harm, subsequent encounter: Secondary | ICD-10-CM | POA: Diagnosis not present

## 2023-04-13 DIAGNOSIS — T1491XA Suicide attempt, initial encounter: Secondary | ICD-10-CM

## 2023-04-13 DIAGNOSIS — F410 Panic disorder [episodic paroxysmal anxiety] without agoraphobia: Secondary | ICD-10-CM | POA: Diagnosis present

## 2023-04-13 DIAGNOSIS — K50919 Crohn's disease, unspecified, with unspecified complications: Secondary | ICD-10-CM | POA: Diagnosis not present

## 2023-04-13 DIAGNOSIS — F411 Generalized anxiety disorder: Secondary | ICD-10-CM | POA: Diagnosis present

## 2023-04-13 DIAGNOSIS — G9340 Encephalopathy, unspecified: Secondary | ICD-10-CM | POA: Diagnosis not present

## 2023-04-13 DIAGNOSIS — F329 Major depressive disorder, single episode, unspecified: Principal | ICD-10-CM | POA: Insufficient documentation

## 2023-04-13 DIAGNOSIS — R251 Tremor, unspecified: Secondary | ICD-10-CM | POA: Diagnosis not present

## 2023-04-13 DIAGNOSIS — R9431 Abnormal electrocardiogram [ECG] [EKG]: Secondary | ICD-10-CM | POA: Diagnosis not present

## 2023-04-13 DIAGNOSIS — T43212A Poisoning by selective serotonin and norepinephrine reuptake inhibitors, intentional self-harm, initial encounter: Secondary | ICD-10-CM | POA: Insufficient documentation

## 2023-04-13 DIAGNOSIS — F1010 Alcohol abuse, uncomplicated: Secondary | ICD-10-CM | POA: Diagnosis present

## 2023-04-13 DIAGNOSIS — R799 Abnormal finding of blood chemistry, unspecified: Secondary | ICD-10-CM | POA: Diagnosis not present

## 2023-04-13 DIAGNOSIS — Z604 Social exclusion and rejection: Secondary | ICD-10-CM | POA: Diagnosis present

## 2023-04-13 DIAGNOSIS — G894 Chronic pain syndrome: Secondary | ICD-10-CM | POA: Diagnosis present

## 2023-04-13 DIAGNOSIS — Z681 Body mass index (BMI) 19 or less, adult: Secondary | ICD-10-CM | POA: Diagnosis not present

## 2023-04-13 DIAGNOSIS — M2550 Pain in unspecified joint: Secondary | ICD-10-CM

## 2023-04-13 DIAGNOSIS — Z7952 Long term (current) use of systemic steroids: Secondary | ICD-10-CM

## 2023-04-13 DIAGNOSIS — X838XXA Intentional self-harm by other specified means, initial encounter: Secondary | ICD-10-CM | POA: Insufficient documentation

## 2023-04-13 DIAGNOSIS — Z8249 Family history of ischemic heart disease and other diseases of the circulatory system: Secondary | ICD-10-CM | POA: Diagnosis not present

## 2023-04-13 DIAGNOSIS — K7460 Unspecified cirrhosis of liver: Secondary | ICD-10-CM | POA: Diagnosis present

## 2023-04-13 DIAGNOSIS — R4182 Altered mental status, unspecified: Secondary | ICD-10-CM | POA: Diagnosis not present

## 2023-04-13 DIAGNOSIS — Z832 Family history of diseases of the blood and blood-forming organs and certain disorders involving the immune mechanism: Secondary | ICD-10-CM

## 2023-04-13 DIAGNOSIS — G9081 Serotonin syndrome: Secondary | ICD-10-CM | POA: Diagnosis present

## 2023-04-13 DIAGNOSIS — Z880 Allergy status to penicillin: Secondary | ICD-10-CM | POA: Diagnosis not present

## 2023-04-13 DIAGNOSIS — M159 Polyosteoarthritis, unspecified: Secondary | ICD-10-CM | POA: Diagnosis present

## 2023-04-13 DIAGNOSIS — R636 Underweight: Secondary | ICD-10-CM | POA: Diagnosis present

## 2023-04-13 DIAGNOSIS — Z85828 Personal history of other malignant neoplasm of skin: Secondary | ICD-10-CM

## 2023-04-13 DIAGNOSIS — Z800 Family history of malignant neoplasm of digestive organs: Secondary | ICD-10-CM

## 2023-04-13 DIAGNOSIS — K5090 Crohn's disease, unspecified, without complications: Secondary | ICD-10-CM

## 2023-04-13 DIAGNOSIS — Z79899 Other long term (current) drug therapy: Secondary | ICD-10-CM

## 2023-04-13 DIAGNOSIS — Z808 Family history of malignant neoplasm of other organs or systems: Secondary | ICD-10-CM

## 2023-04-13 DIAGNOSIS — Z860101 Personal history of adenomatous and serrated colon polyps: Secondary | ICD-10-CM

## 2023-04-13 DIAGNOSIS — Z803 Family history of malignant neoplasm of breast: Secondary | ICD-10-CM

## 2023-04-13 DIAGNOSIS — T887XXA Unspecified adverse effect of drug or medicament, initial encounter: Secondary | ICD-10-CM | POA: Diagnosis not present

## 2023-04-13 DIAGNOSIS — R451 Restlessness and agitation: Secondary | ICD-10-CM | POA: Diagnosis present

## 2023-04-13 DIAGNOSIS — K604 Rectal fistula, unspecified: Secondary | ICD-10-CM

## 2023-04-13 LAB — COMPREHENSIVE METABOLIC PANEL
ALT: 36 U/L (ref 0–44)
AST: 25 U/L (ref 15–41)
Albumin: 2.8 g/dL — ABNORMAL LOW (ref 3.5–5.0)
Alkaline Phosphatase: 106 U/L (ref 38–126)
Anion gap: 12 (ref 5–15)
BUN: 14 mg/dL (ref 6–20)
CO2: 23 mmol/L (ref 22–32)
Calcium: 8.1 mg/dL — ABNORMAL LOW (ref 8.9–10.3)
Chloride: 99 mmol/L (ref 98–111)
Creatinine, Ser: 0.82 mg/dL (ref 0.61–1.24)
GFR, Estimated: >60 mL/min (ref >60)
Glucose, Bld: 75 mg/dL (ref 70–99)
Potassium: 3.1 mmol/L — ABNORMAL LOW (ref 3.5–5.1)
Sodium: 134 mmol/L — ABNORMAL LOW (ref 135–145)
Total Bilirubin: 0.8 mg/dL (ref 0.0–1.2)
Total Protein: 5.6 g/dL — ABNORMAL LOW (ref 6.5–8.1)

## 2023-04-13 LAB — CBC WITH DIFFERENTIAL/PLATELET
Abs Immature Granulocytes: 0.1 10*3/uL — ABNORMAL HIGH (ref 0.00–0.07)
Basophils Absolute: 0 10*3/uL (ref 0.0–0.1)
Basophils Relative: 0 %
Eosinophils Absolute: 0 10*3/uL (ref 0.0–0.5)
Eosinophils Relative: 0 %
HCT: 46.4 % (ref 39.0–52.0)
Hemoglobin: 15.5 g/dL (ref 13.0–17.0)
Lymphocytes Relative: 2 %
Lymphs Abs: 0.2 10*3/uL — ABNORMAL LOW (ref 0.7–4.0)
MCH: 31.9 pg (ref 26.0–34.0)
MCHC: 33.4 g/dL (ref 30.0–36.0)
MCV: 95.5 fL (ref 80.0–100.0)
Metamyelocytes Relative: 1 %
Monocytes Absolute: 0.4 10*3/uL (ref 0.1–1.0)
Monocytes Relative: 3 %
Neutro Abs: 11.7 10*3/uL — ABNORMAL HIGH (ref 1.7–7.7)
Neutrophils Relative %: 94 %
Platelets: 200 10*3/uL (ref 150–400)
RBC: 4.86 MIL/uL (ref 4.22–5.81)
RDW: 13.2 % (ref 11.5–15.5)
WBC: 12.4 10*3/uL — ABNORMAL HIGH (ref 4.0–10.5)
nRBC: 0 % (ref 0.0–0.2)

## 2023-04-13 LAB — RAPID URINE DRUG SCREEN, HOSP PERFORMED
Amphetamines: NOT DETECTED
Barbiturates: NOT DETECTED
Benzodiazepines: NOT DETECTED
Cocaine: NOT DETECTED
Opiates: NOT DETECTED
Tetrahydrocannabinol: NOT DETECTED

## 2023-04-13 LAB — SALICYLATE LEVEL: Salicylate Lvl: <7 mg/dL — ABNORMAL LOW (ref 7.0–30.0)

## 2023-04-13 LAB — ACETAMINOPHEN LEVEL: Acetaminophen (Tylenol), Serum: <10 ug/mL — ABNORMAL LOW (ref 10–30)

## 2023-04-13 LAB — ETHANOL: Alcohol, Ethyl (B): <10 mg/dL (ref <10)

## 2023-04-13 LAB — CBG MONITORING, ED: Glucose-Capillary: 84 mg/dL (ref 70–99)

## 2023-04-13 LAB — CK: Total CK: 27 U/L — ABNORMAL LOW (ref 49–397)

## 2023-04-13 MED ORDER — ACETAMINOPHEN 325 MG PO TABS: 650.0000 mg | ORAL_TABLET | Freq: Four times a day (QID) | ORAL | Status: DC | PRN

## 2023-04-13 MED ORDER — MIRTAZAPINE 15 MG PO TABS
30.0000 mg | ORAL_TABLET | Freq: Every day | ORAL | Status: DC
  Administered 2023-04-13: 30 mg via ORAL
  Filled 2023-04-13: qty 2

## 2023-04-13 MED ORDER — DIPHENHYDRAMINE HCL 50 MG/ML IJ SOLN: 50.0000 mg | Freq: Three times a day (TID) | INTRAMUSCULAR | Status: DC | PRN

## 2023-04-13 MED ORDER — ESCITALOPRAM OXALATE 10 MG PO TABS
10.0000 mg | ORAL_TABLET | Freq: Every day | ORAL | Status: DC
  Administered 2023-04-13: 10 mg via ORAL
  Filled 2023-04-13: qty 1

## 2023-04-13 MED ORDER — LORAZEPAM 2 MG/ML IJ SOLN: 1.0000 mg | Freq: Once | INTRAMUSCULAR | Status: DC

## 2023-04-13 MED ORDER — TRAZODONE HCL 50 MG PO TABS
50.0000 mg | ORAL_TABLET | Freq: Every evening | ORAL | Status: DC | PRN
  Administered 2023-04-13: 50 mg via ORAL
  Filled 2023-04-13: qty 1

## 2023-04-13 MED ORDER — DICYCLOMINE HCL 10 MG PO CAPS
10.0000 mg | ORAL_CAPSULE | Freq: Three times a day (TID) | ORAL | Status: DC
  Administered 2023-04-14 – 2023-04-23 (×30): 10 mg via ORAL
  Filled 2023-04-13 (×32): qty 1

## 2023-04-13 MED ORDER — PREDNISONE 10 MG PO TABS
30.0000 mg | ORAL_TABLET | Freq: Every day | ORAL | Status: DC
  Administered 2023-04-14 – 2023-05-20 (×37): 30 mg via ORAL
  Filled 2023-04-13 (×38): qty 3

## 2023-04-13 MED ORDER — DIPHENHYDRAMINE HCL 25 MG PO CAPS: 50.0000 mg | ORAL_CAPSULE | Freq: Three times a day (TID) | ORAL | Status: DC | PRN

## 2023-04-13 MED ORDER — LORAZEPAM 2 MG/ML IJ SOLN: 2.0000 mg | Freq: Three times a day (TID) | INTRAMUSCULAR | Status: DC | PRN

## 2023-04-13 MED ORDER — CHARCOAL ACTIVATED PO LIQD
50.0000 g | Freq: Once | ORAL | Status: DC
  Filled 2023-04-13: qty 240

## 2023-04-13 MED ORDER — HALOPERIDOL LACTATE 5 MG/ML IJ SOLN: 5.0000 mg | Freq: Three times a day (TID) | INTRAMUSCULAR | Status: DC | PRN

## 2023-04-13 MED ORDER — HALOPERIDOL LACTATE 5 MG/ML IJ SOLN: 10.0000 mg | Freq: Three times a day (TID) | INTRAMUSCULAR | Status: DC | PRN

## 2023-04-13 MED ORDER — POTASSIUM CHLORIDE CRYS ER 20 MEQ PO TBCR
40.0000 meq | EXTENDED_RELEASE_TABLET | Freq: Once | ORAL | Status: AC
  Administered 2023-04-13: 40 meq via ORAL
  Filled 2023-04-13: qty 2

## 2023-04-13 MED ORDER — LORAZEPAM 2 MG/ML IJ SOLN
1.0000 mg | Freq: Once | INTRAMUSCULAR | Status: AC
  Administered 2023-04-13: 1 mg via INTRAVENOUS
  Filled 2023-04-13: qty 1

## 2023-04-13 MED ORDER — MAGNESIUM HYDROXIDE 400 MG/5ML PO SUSP: 30.0000 mL | Freq: Every day | ORAL | Status: DC | PRN

## 2023-04-13 MED ORDER — HALOPERIDOL 5 MG PO TABS
5.0000 mg | ORAL_TABLET | Freq: Three times a day (TID) | ORAL | Status: DC | PRN
Start: 2023-04-13 — End: 2023-04-15

## 2023-04-13 MED ORDER — LORAZEPAM 2 MG/ML IJ SOLN
2.0000 mg | Freq: Three times a day (TID) | INTRAMUSCULAR | Status: DC | PRN
Start: 2023-04-13 — End: 2023-05-20

## 2023-04-13 MED ORDER — BUSPIRONE HCL 5 MG PO TABS
10.0000 mg | ORAL_TABLET | Freq: Two times a day (BID) | ORAL | Status: DC
  Administered 2023-04-13 – 2023-04-14 (×2): 10 mg via ORAL
  Filled 2023-04-13 (×2): qty 2

## 2023-04-13 MED ORDER — LORAZEPAM 2 MG/ML IJ SOLN
2.0000 mg | Freq: Once | INTRAMUSCULAR | Status: AC
  Administered 2023-04-13: 2 mg via INTRAVENOUS
  Filled 2023-04-13: qty 1

## 2023-04-13 MED ORDER — LABETALOL HCL 5 MG/ML IV SOLN
5.0000 mg | Freq: Once | INTRAVENOUS | Status: DC
  Filled 2023-04-13: qty 4

## 2023-04-13 MED ORDER — HYDROXYZINE HCL 25 MG PO TABS
25.0000 mg | ORAL_TABLET | Freq: Three times a day (TID) | ORAL | Status: DC | PRN
  Administered 2023-04-14 – 2023-05-20 (×53): 25 mg via ORAL
  Filled 2023-04-13 (×56): qty 1

## 2023-04-13 MED ORDER — ALUM & MAG HYDROXIDE-SIMETH 200-200-20 MG/5ML PO SUSP: 30.0000 mL | ORAL | Status: DC | PRN

## 2023-04-13 MED ORDER — MAGNESIUM OXIDE -MG SUPPLEMENT 400 (240 MG) MG PO TABS
800.0000 mg | ORAL_TABLET | Freq: Once | ORAL | Status: AC
  Administered 2023-04-13: 800 mg via ORAL
  Filled 2023-04-13: qty 2

## 2023-04-13 NOTE — ED Notes (Signed)
[facility] contacted. ETA tomorrow morning approx 0900.

## 2023-04-13 NOTE — ED Notes (Signed)
Report given to [facility].

## 2023-04-13 NOTE — ED Notes (Signed)
Urinal given to patient RN explained urine is needed clear understanding voiced by patient

## 2023-04-13 NOTE — ED Notes (Signed)
[facility] called pts mother [facility]) for collateral. Per pts mother pt was recently at [facility] and was home for one day. Pt is disabled and lives with his parents. After being home for one day, pts anxiety increased when he took buspar which aggravates pts crohn's disease. Pt has difficulty digesting medications with certain binders.   Last night pt went to bed around 6pm. At 3am pts parents were awakened by pt saying he had taken 28 lexapro pills and several remeron. Pts parents called EMS for pt to be transported to the [facility] ED. Per pts mother pt has had two long term hospitalizations, one in 2003 (two months consecutively) and one in 2020. Pts mother reports pt having a mental breakdown in 2014 as well.  Pts mother feels that pt does better with long term inpatient care. Pts mother reports a family history of mental illness with her mother having similar issues and an eventual breakdown.  Pts mother feels that pt needs to be inpatient in a long term care facility. Pts mother would like to be notified of pts disposition.   Jacquelynn Cree, [facility]

## 2023-04-13 NOTE — ED Notes (Signed)
Patient resting in bed breathing eyes closed with visitor at bedside

## 2023-04-13 NOTE — Group Note (Signed)
Date:  04/13/2023 Time:  9:34 PM  Group Topic/Focus:  Rediscovering Joy:   The focus of this group is to explore various ways to relieve stress in a positive manner.    Participation Level:  Did Not Attend  Participation Quality:   none  Affect:   none  Cognitive:   none  Insight: None  Engagement in Group:   none  Modes of Intervention:   none  Additional Comments:  none   Renny Gunnarson 04/13/2023, 9:34 PM

## 2023-04-13 NOTE — ED Notes (Addendum)
[facility] [phone]

## 2023-04-13 NOTE — ED Provider Notes (Signed)
  Physical Exam  BP 139/89   Pulse 66   Temp 98.3 F (36.8 C) (Rectal)   Resp 18   SpO2 100%   Physical Exam Constitutional:      General: He is not in acute distress.    Appearance: Normal appearance.  HENT:     Head: Normocephalic and atraumatic.     Nose: No congestion or rhinorrhea.  Eyes:     General:        Right eye: No discharge.        Left eye: No discharge.     Extraocular Movements: Extraocular movements intact.     Pupils: Pupils are equal, round, and reactive to light.  Cardiovascular:     Rate and Rhythm: Normal rate and regular rhythm.     Heart sounds: No murmur heard. Pulmonary:     Effort: No respiratory distress.     Breath sounds: No wheezing or rales.  Abdominal:     General: There is no distension.     Tenderness: There is no abdominal tenderness.  Musculoskeletal:        General: Normal range of motion.     Cervical back: Normal range of motion.  Skin:    General: Skin is warm and dry.  Neurological:     General: No focal deficit present.     Mental Status: He is alert.     Cranial Nerves: No cranial nerve deficit.     Sensory: No sensory deficit.     Motor: No weakness.     Procedures  Procedures  ED Course / MDM    Medical Decision Making Amount and/or Complexity of Data Reviewed Labs: ordered.  Risk OTC drugs. Prescription drug management.   Patient received in handoff.  Suicide attempt with 28 tablets of 10 mg Lexapro at 2 AM on 04/13/2023.  Patient had some mild symptoms of serotonin syndrome on arrival with symptomatic improvement with benzodiazepines.  Did have return of symptoms requiring additional dose of lorazepam   Patient is also on buspirone.  Previous provider spoke with poison control who states that the total amount of ingested Lexapro likely is not enough to cause serotonin syndrome by itself and they were recommending an 8-hour observation. plan at time of signout was for reevaluation at 10 AM.  On reevaluation,  patient states he feels improved and is currently not displaying physical exam evidence of serotonin syndrome.  We will continue to monitor closely but at this time ED is medically cleared for TTS evaluation.  TTS consult placed and disposition pending TTS recommendations       Brek Reece, Wyn Forster, MD 04/13/23 1010

## 2023-04-13 NOTE — ED Notes (Addendum)
Patient en route to [facility] with SO.

## 2023-04-13 NOTE — ED Triage Notes (Signed)
Pt to ED by EMS from home with c/o intentional overdose. Pt endorses taking approx 28x10mg  lexapro. Arrives A+O, VSS, NADN.

## 2023-04-13 NOTE — BH Assessment (Signed)
Disposition Note:  [name], PMHNP, recommends inpatient psychiatric treatment for the patient. The [facility] address is [address] [place], [place], [place] [address].  Cheron Every, RN, [facility], reviewed the patient and accepted the patient for admission to [facility], pending repeat vital signs (VS).  Attending MD: Dr. Sandria Senter. RN Report: To be provided to [phone]. Diagnosis: Major Depressive Disorder (MDD).  IVC paperwork has been reviewed.
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
## 2023-04-13 NOTE — ED Provider Notes (Signed)
EMERGENCY DEPARTMENT AT [facility] Provider Note   CSN: [id number] Arrival date & time: 04/13/23  0404     History  Chief Complaint  Patient presents with   Suicide Attempt    [name] is a 58 y.o. male.  HPI   58 year old male with medical history significant for Crohn's disease, alcohol abuse, anxiety, chronic mesenteric ischemia, history of suicide attempt, major depression presenting to the emergency department after an intentional overdose.  The patient states that he took 28 tablets of his 10 mg home Lexapro about 2 hours prior to arrival with the intention of ending his life.  He presents with EMS hypertensive but otherwise vitally stable.  On chart review, the patient was seen at the behavioral health urgent care 2 days ago, "requesting medications for anxiety and panic" during which time the patient's Lexapro was subsequently increased from 5 mg to 10 mg, he was continued on 30 mg of Remeron nightly and he was started on 10 mg of BuSpar.  He states that he missed his nightly Remeron but has been otherwise taking his other medications.  He presents agitated, with spontaneous clonus noted and hypertensive.  Home Medications Prior to Admission medications   Medication Sig Start Date End Date Taking? Authorizing Provider  busPIRone (BUSPAR) 10 MG tablet Take 1 tablet (10 mg total) by mouth 2 (two) times daily. 04/11/23   Ardis Hughs, NP  dicyclomine (BENTYL) 10 MG capsule Take 1 capsule (10 mg total) by mouth 3 (three) times daily before meals. 04/04/23   Starleen Blue, NP  escitalopram (LEXAPRO) 10 MG tablet Take 1 tablet (10 mg total) by mouth at bedtime. 04/11/23   Ardis Hughs, NP  mirtazapine (REMERON) 30 MG tablet Take 1 tablet (30 mg total) by mouth at bedtime. 04/04/23   Starleen Blue, NP  predniSONE (DELTASONE) 20 MG tablet Take 1.5 tablets (30 mg total) by mouth daily with breakfast. 01/17/23   Iva Boop, MD       Allergies    Asacol [mesalamine], Azathioprine, Gluten meal, Metoprolol tartrate, Mycophenolate mofetil, Other, Wheat, Amoxicillin, and Tape    Review of Systems   Review of Systems  Unable to perform ROS: Acuity of condition    Physical Exam Updated Vital Signs BP (!) 176/97 (BP Location: Left Arm)   Pulse 85   Temp 98.7 F (37.1 C) (Oral)   Resp 18   SpO2 100%  Physical Exam Vitals and nursing note reviewed.  Constitutional:      Comments: GCS 14, acutely agitated and hyperactive  HENT:     Head: Normocephalic and atraumatic.  Eyes:     Conjunctiva/sclera: Conjunctivae normal.     Pupils: Pupils are equal, round, and reactive to light.  Cardiovascular:     Rate and Rhythm: Normal rate and regular rhythm.  Pulmonary:     Effort: Pulmonary effort is normal. No respiratory distress.  Abdominal:     General: There is no distension.     Tenderness: There is no abdominal tenderness. There is no guarding.  Musculoskeletal:        General: No deformity or signs of injury.     Cervical back: Neck supple.  Skin:    Findings: No lesion or rash.  Neurological:     Mental Status: He is alert.     Comments: GCS 14, hyperactive, agitated, spontaneous clonus noted. Unable to fully cooperate with CN exam, moving all four extremities with hyperreflexia noted in the bilateral  lower extremities, spontaneous and inducible clonus noted in the bilateral lower extremities.  Psychiatric:        Mood and Affect: Mood is anxious.        Behavior: Behavior is agitated and hyperactive. Behavior is not combative.        Thought Content: Thought content includes suicidal ideation.     ED Results / Procedures / Treatments   Labs (all labs ordered are listed, but only abnormal results are displayed) Labs Reviewed  COMPREHENSIVE METABOLIC PANEL - Abnormal; Notable for the following components:      Result Value   Sodium 134 (*)    Potassium 3.1 (*)    Calcium 8.1 (*)    Total Protein 5.6  (*)    Albumin 2.8 (*)    All other components within normal limits  CBC WITH DIFFERENTIAL/PLATELET - Abnormal; Notable for the following components:   WBC 12.4 (*)    All other components within normal limits  SALICYLATE LEVEL  ACETAMINOPHEN LEVEL  ETHANOL  RAPID URINE DRUG SCREEN, HOSP PERFORMED  CK  CBG MONITORING, ED    EKG EKG Interpretation Date/Time:  Wednesday April 13 2023 04:25:58 EST Ventricular Rate:  88 PR Interval:    QRS Duration:  105 QT Interval:  411 QTC Calculation: 498 R Axis:   25  Text Interpretation: Sinus rhythm Minimal ST depression Borderline prolonged QT interval Underlying artifact due to patient tremors Confirmed by Ernie Avena ([id number]) on 04/13/2023 4:53:45 AM  Radiology No results found.  Procedures .Critical Care  Performed by: Ernie Avena, MD Authorized by: Ernie Avena, MD   Critical care provider statement:    Critical care time (minutes):  30   Critical care was time spent personally by me on the following activities:  Development of treatment plan with patient or surrogate, discussions with consultants, evaluation of patient's response to treatment, examination of patient, ordering and review of laboratory studies, ordering and review of radiographic studies, ordering and performing treatments and interventions, pulse oximetry, re-evaluation of patient's condition and review of old charts     Medications Ordered in ED Medications  LORazepam (ATIVAN) injection 2 mg (2 mg Intravenous Given 04/13/23 0454)    ED Course/ Medical Decision Making/ A&P                                 Medical Decision Making Amount and/or Complexity of Data Reviewed Labs: ordered.  Risk OTC drugs. Prescription drug management.    58 year old male with medical history significant for Crohn's disease, alcohol abuse, anxiety, chronic mesenteric ischemia, history of suicide attempt, major depression presenting to the emergency department after an  intentional overdose.  The patient states that he took 28 tablets of his 10 mg home Lexapro about 2 hours prior to arrival with the intention of ending his life.  He presents with EMS hypertensive but otherwise vitally stable.  On chart review, the patient was seen at the behavioral health urgent care 2 days ago, "requesting medications for anxiety and panic" during which time the patient's Lexapro was subsequently increased from 5 mg to 10 mg, he was continued on 30 mg of Remeron nightly and he was started on 10 mg of BuSpar.  He states that he missed his nightly Remeron but has been otherwise taking his other medications.  He presents agitated, with spontaneous clonus noted and hypertensive.  On arrival, the patient was afebrile, not tachycardic or tachypneic, hypertensive  BP 176/97.  On exam, the patient was hyperactive with both spontaneous and inducible clonus noted in the bilateral lower extremities.  Given the patient's home medications and acute overdose, concern for developing serotonin syndrome.  IV access was obtained and the patient was administered 2 mg of IV Ativan with some improvement in his hyperactivity.  Initial EKG revealed sinus rhythm, ventricular rate 88, mildly prolonged QT interval at 498, no STEMI.  I spoke with poison control who recommended 8 hours of cardiac monitoring in the setting of the patient's presentation.  Did not recommend activated charcoal at this time.  Was in control did recommend a CK.  CBC revealed a leukocytosis of 12.4, no anemia, CMP with mild hypokalemia to 3.1, replenished orally, CBG normal, Tylenol salicylate and ethanol levels negative, CK pending.  Actively suicidal in the setting of an intentional overdose, IVC paperwork completed.  On repeat evaluation, the patients BP had improved s/p Ativan 2mg  IV. His spontaneous clonus had also resolved after 2 hours. Pt still with inducible clonus on exam. No longer acutely agitated.  Plan for 8 hour cardiac  monitoring in the ED, will need repeat assessment prior to full medical clearance. Signout given to Dr. Posey Rea at 0730.   Final Clinical Impression(s) / ED Diagnoses Final diagnoses:  Suicide attempt [facility])  Intentional drug overdose, initial encounter [facility])  Serotonin syndrome    Rx / DC Orders ED Discharge Orders     None         Ernie Avena, MD 04/13/23 [phone]

## 2023-04-13 NOTE — ED Notes (Signed)
Patient resting in bed breathing eyes closed with family at bedside

## 2023-04-14 ENCOUNTER — Inpatient Hospital Stay: Payer: Medicare Other

## 2023-04-14 DIAGNOSIS — K50919 Crohn's disease, unspecified, with unspecified complications: Secondary | ICD-10-CM | POA: Diagnosis not present

## 2023-04-14 DIAGNOSIS — F329 Major depressive disorder, single episode, unspecified: Secondary | ICD-10-CM

## 2023-04-14 DIAGNOSIS — R251 Tremor, unspecified: Secondary | ICD-10-CM | POA: Diagnosis present

## 2023-04-14 DIAGNOSIS — F332 Major depressive disorder, recurrent severe without psychotic features: Secondary | ICD-10-CM

## 2023-04-14 DIAGNOSIS — T43222D Poisoning by selective serotonin reuptake inhibitors, intentional self-harm, subsequent encounter: Secondary | ICD-10-CM | POA: Diagnosis not present

## 2023-04-14 LAB — CBC WITH DIFFERENTIAL/PLATELET
Abs Immature Granulocytes: 0.79 10*3/uL — ABNORMAL HIGH (ref 0.00–0.07)
Basophils Absolute: 0.1 10*3/uL (ref 0.0–0.1)
Basophils Relative: 1 %
Eosinophils Absolute: 0 10*3/uL (ref 0.0–0.5)
Eosinophils Relative: 0 %
HCT: 44.4 % (ref 39.0–52.0)
Hemoglobin: 15.1 g/dL (ref 13.0–17.0)
Immature Granulocytes: 7 %
Lymphocytes Relative: 3 %
Lymphs Abs: 0.4 10*3/uL — ABNORMAL LOW (ref 0.7–4.0)
MCH: 31.9 pg (ref 26.0–34.0)
MCHC: 34 g/dL (ref 30.0–36.0)
MCV: 93.7 fL (ref 80.0–100.0)
Monocytes Absolute: 0.3 10*3/uL (ref 0.1–1.0)
Monocytes Relative: 3 %
Neutro Abs: 10.5 10*3/uL — ABNORMAL HIGH (ref 1.7–7.7)
Neutrophils Relative %: 86 %
Platelets: 219 10*3/uL (ref 150–400)
RBC: 4.74 MIL/uL (ref 4.22–5.81)
RDW: 13.2 % (ref 11.5–15.5)
Smear Review: NORMAL
WBC: 12.1 10*3/uL — ABNORMAL HIGH (ref 4.0–10.5)
nRBC: 0 % (ref 0.0–0.2)

## 2023-04-14 LAB — COMPREHENSIVE METABOLIC PANEL
ALT: 41 U/L (ref 0–44)
AST: 33 U/L (ref 15–41)
Albumin: 2.7 g/dL — ABNORMAL LOW (ref 3.5–5.0)
Alkaline Phosphatase: 108 U/L (ref 38–126)
Anion gap: 9 (ref 5–15)
BUN: 29 mg/dL — ABNORMAL HIGH (ref 6–20)
CO2: 26 mmol/L (ref 22–32)
Calcium: 8.3 mg/dL — ABNORMAL LOW (ref 8.9–10.3)
Chloride: 103 mmol/L (ref 98–111)
Creatinine, Ser: 0.99 mg/dL (ref 0.61–1.24)
GFR, Estimated: >60 mL/min (ref >60)
Glucose, Bld: 100 mg/dL — ABNORMAL HIGH (ref 70–99)
Potassium: 4.1 mmol/L (ref 3.5–5.1)
Sodium: 138 mmol/L (ref 135–145)
Total Bilirubin: 0.9 mg/dL (ref 0.0–1.2)
Total Protein: 5.5 g/dL — ABNORMAL LOW (ref 6.5–8.1)

## 2023-04-14 LAB — TROPONIN I (HIGH SENSITIVITY): Troponin I (High Sensitivity): 4 ng/L (ref <18)

## 2023-04-14 LAB — CK: Total CK: 19 U/L — ABNORMAL LOW (ref 49–397)

## 2023-04-14 LAB — GLUCOSE, CAPILLARY: Glucose-Capillary: 120 mg/dL — ABNORMAL HIGH (ref 70–99)

## 2023-04-14 MED ORDER — ENSURE ENLIVE PO LIQD
237.0000 mL | Freq: Two times a day (BID) | ORAL | Status: DC
  Administered 2023-04-15: 237 mL via ORAL

## 2023-04-14 MED ORDER — DOXYCYCLINE HYCLATE 100 MG PO TABS
100.0000 mg | ORAL_TABLET | Freq: Two times a day (BID) | ORAL | Status: DC
  Administered 2023-04-14 – 2023-05-20 (×71): 100 mg via ORAL
  Filled 2023-04-14 (×71): qty 1

## 2023-04-14 MED ORDER — SODIUM CHLORIDE 0.9 % IV BOLUS
1000.0000 mL | Freq: Once | INTRAVENOUS | Status: AC
  Administered 2023-04-15: 1000 mL via INTRAVENOUS

## 2023-04-14 MED ORDER — LORAZEPAM 1 MG PO TABS
1.0000 mg | ORAL_TABLET | ORAL | Status: AC
  Administered 2023-04-14: 1 mg via ORAL
  Filled 2023-04-14: qty 1

## 2023-04-14 MED ORDER — LORAZEPAM 1 MG PO TABS
1.0000 mg | ORAL_TABLET | ORAL | Status: DC | PRN
  Administered 2023-04-15 – 2023-05-20 (×56): 1 mg via ORAL
  Filled 2023-04-14 (×58): qty 1

## 2023-04-14 MED ORDER — ADULT MULTIVITAMIN W/MINERALS CH
1.0000 | ORAL_TABLET | Freq: Every day | ORAL | Status: DC
  Administered 2023-04-14 – 2023-05-20 (×37): 1 via ORAL
  Filled 2023-04-14 (×37): qty 1

## 2023-04-14 NOTE — Group Note (Signed)
[facility] LCSW Group Therapy Note   Group Date: 04/14/2023 Start Time: 1300 End Time: 1400   Type of Therapy/Topic:  Group Therapy:  Balance in Life  Participation Level:  Did Not Attend   Description of Group:    This group will address the concept of balance and how it feels and looks when one is unbalanced. Patients will be encouraged to process areas in their lives that are out of balance, and identify reasons for remaining unbalanced. Facilitators will guide patients utilizing problem- solving interventions to address and correct the stressor making their life unbalanced. Understanding and applying boundaries will be explored and addressed for obtaining  and maintaining a balanced life. Patients will be encouraged to explore ways to assertively make their unbalanced needs known to significant others in their lives, using other group members and facilitator for support and feedback.  Therapeutic Goals: Patient will identify two or more emotions or situations they have that consume much of in their lives. Patient will identify signs/triggers that life has become out of balance:  Patient will identify two ways to set boundaries in order to achieve balance in their lives:  Patient will demonstrate ability to communicate their needs through discussion and/or role plays  Summary of Patient Progress: X   Therapeutic Modalities:   Cognitive Behavioral Therapy Solution-Focused Therapy Assertiveness Training   Glenis Smoker, LCSW

## 2023-04-14 NOTE — Progress Notes (Signed)
Received Pt. From the ER to the BMU via wheelchair. Pt. Awke but very drowsy. Pt. Orientated to self, place and situation. Answeing questions appropriately. He states that he was just released from [facility] a week ago and that his health seemed to be study declining. He stated that he feel hopeless and he don't want to continue to live like this and that is the reason he took 28 tablets of Lexapro. He states he was supposed to be his parents caregiver but since has been home from [facility] that his elderly parents has been taking care of him. He said he was found b his father who called 911. He is noted to be very pale and cachexia and stated that he has Crohn disease and can not eat much of anything.Marland Kitchen He is noted to have several ecchymotic areas to the right side of his body including his right hand, right wrist and right leg which he says came from falling a week ago. There is no draining from these sights, all have scabs.

## 2023-04-14 NOTE — [facility] Counselor (Signed)
Adult Comprehensive Assessment  Patient ID: [name], male   DOB: September 28, 1965, 58 y.o.   MRN: [id number]  Information Source: Information source: Patient  Current Stressors:  Patient states their primary concerns and needs for treatment are:: "I tried to kill myself." Patient states their goals for this hospitilization and ongoing recovery are:: "I don't know." Educational / Learning stressors: N/A Employment / Job issues: Patient denies. Family Relationships: Patient denies. 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 I haven't checked my bank account because I'm too nervous to look at it." Housing / Lack of housing: "I don't know how I'm going to move anywhere because I can't walk or eat." Physical health (include injuries & life threatening diseases): Chronic medical issues to include Chron's, with difficulty managing with medications. Social relationships: "I don't have any friends." Substance abuse: Patient denies. Bereavement / Loss: Patient denies.  Living/Environment/Situation:  Living Arrangements: Parent Living conditions (as described by patient or guardian): WNL Who else lives in the home?: "With my parents." How long has patient lived in current situation?: "On and off for a few years." What is atmosphere in current home: Supportive  Family History:  Marital status: Single Are you sexually active?: No What is your sexual orientation?: "Heterosexual." Has your sexual activity been affected by drugs, alcohol, medication, or emotional stress?: Unable to asse4ss. Does patient have children?: Yes How many children?: 1 How is patient's relationship with their children?: "Ok"  Childhood History:  By whom was/is the patient raised?: Mother Description of patient's relationship with caregiver when they were a child: "It was good." Patient's description of current relationship with people who raised him/her: "It's good." How were  you disciplined when you got in trouble as a child/adolescent?: "Punishment when I did things wrong." Does patient have siblings?: Yes Number of Siblings: 1 Description of patient's current relationship with siblings: "It's good." Did patient suffer any verbal/emotional/physical/sexual abuse as a child?: No Did patient suffer from severe childhood neglect?: No Has patient ever been sexually abused/assaulted/raped as an adolescent or adult?: No Was the patient ever a victim of a crime or a disaster?: No Witnessed domestic violence?: No Has patient been affected by domestic violence as an adult?: No  Education:  Highest grade of school patient has completed: Programmer, applications." Currently a student?: No Learning disability?: No  Employment/Work Situation:   Employment Situation: Unemployed Patient's Job has Been Impacted by Current Illness: No 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?: No  Financial Resources:   Surveyor, quantity resources: Insurance claims handler, Medicare Does patient have a Lawyer or guardian?: No  Alcohol/Substance Abuse:   What has been your use of drugs/alcohol within the last 12 months?: Patient denies. If attempted suicide, did drugs/alcohol play a role in this?: No Alcohol/Substance Abuse Treatment Hx: Denies past history Has alcohol/substance abuse ever caused legal problems?: No  Social Support System:   Patient's Community Support System: Fair Museum/gallery exhibitions officer System: "Usually my brother and my mother." Type of faith/religion: "I just pray." How does patient's faith help to cope with current illness?: "I just pray."  Leisure/Recreation:   Do You Have Hobbies?: Yes Leisure and Hobbies: "I like music."  Strengths/Needs:   What is the patient's perception of their strengths?: "I don't have any strengths right now." Patient states  they can use these personal strengths during their treatment  to contribute to their recovery: Unable to assess. Patient states these barriers may affect/interfere with their treatment: None reported. Patient states these barriers may affect their return to the community: None reported.  Discharge Plan:   Patient states concerns and preferences for aftercare planning are: Patient reports being unsure if he can go back home due to his increasing health needs. Patient states they will know when they are safe and ready for discharge when: "I'm not sure." Does patient have access to transportation?: No Does patient have financial barriers related to discharge medications?: No Patient description of barriers related to discharge medications: None reported. Plan for no access to transportation at discharge: CSW to assist in the necessary arrangments prior to discharge. Will patient be returning to same living situation after discharge?:  (Patient is unsure at this moment.)  Summary/Recommendations:   Summary and Recommendations (to be completed by the evaluator): Patient is a 58 year old male with a history significant for Crohn's disease, alcohol abuse, anxiety, chronic mesenteric ischemia, history of suicide attempt, major depression who presented to the emergency department after an intentional overdose. Patient currently resides with his elderly parents and described the home atmosphere as "supportive". Patient reports that he is unsure if he can return home because he "can't walk or eat." During assessment, patient was tremoring excessively and reported severe anxiety, weakness and light headedness. Patient reported that he thought he was "going to die." Patient endorsed financial stressors explaining that while he is unemployed, he does get disability but is unable to use his "bank books" independently. Patient also reported that he never looks at his statements because "he's too nervous to look at it."   It has been reported in previous notes that the patient's Crohn's disease is usually exuberated by his anxiety. Patient described her support system as "fine" adding that his brother and mother is usually his main support. Patient reported possibly being interested in a skilled nursing facility referral. Patient does not currently receive outpatient therapy and would like a referral. Patient denies current hallucinations, delusions, substance use and homicidal ideation. Patient does endorse feelings of hopelessness. Recommendations include crisis stabilization, therapeutic milieu, encourage group attendance and participation, medication management for mood stabilization and development of comprehensive mental wellness/sobriety plan.  Lowry Ram. 04/14/2023

## 2023-04-14 NOTE — Assessment & Plan Note (Addendum)
Positive generalized tremor and mild ataxia in the setting of recent Lexapro overdose Does not appear to be in acute serotonin syndrome Does appear to be psychiatric component as patient reports worsening symptoms associated with group activities and large crowds Vital signs stable at present Psychiatry NP discussed case w/ poison control- (rec checking EKG and electrolytes)  Case discussed with Dr. Selina Cooley with neurology- to consult in am  Will check baseline labs including CBC, CMP, CK Check CT head as well as chest x-ray EKG pending to rule out QT syndrome Magnesium level Replete electrolytes IV fluid bolus x 1 Pharmacy to consult for medication management and potential overlapping serotonergic compounds  Otherwise monitor

## 2023-04-14 NOTE — H&P (Signed)
 Psychiatric Admission Assessment Adult  Patient Identification: [name] MRN:  [id number] Date of Evaluation:  04/14/2023 Chief Complaint:  MDD (major depressive disorder) [F32.9] Principal Diagnosis: MDD (major depressive disorder), recurrent severe, without psychosis ([name]) Diagnosis:  Principal Problem:   MDD (major depressive disorder), recurrent severe, without psychosis ([name]) Active Problems:   GAD (generalized anxiety disorder)   Tremor  History of Present Illness: [name] is a 58 year old Caucasian male with a history of MDD, anxiety, Crohn's disease, and chronic mesenteric ischemia who presented to [facility] emergency department on 04/13/23 at 0404 for suicide attempt by ingestion of 28 tablets of 10mg  Lexapro at 0200. Per ED documentation, patient had some mild symptoms of serotonin syndrome on arrival, including acute agitation and clonus, with symptomatic improvement with benzodiazepines. Did have a return of symptoms requiring additional dose of lorazepam. Poison control advised the total amount of ingested Lexapro likely is not enough to cause serotonin syndrome by itself and they recommended 8-hour cardiac monitoring. Qtc 498.  Patient was recently hospitalized at [facility] 03/28/23-04/04/23 for SI. He presented to the [facility] ED on 04/04/23 for dehydration. He presented to [facility] on 04/11/23 requesting medications for anxiety and panic at which time Lexapro was increased from 5mg  to 10mg , Remeron was continued, and trial of buspirone was initiated.  Upon evaluation today, patient continues to endorse suicidal ideation and feelings of helplessness. He is a poor historian due to his current state. He presents with bilateral upper and lower extremity tremors, restlessness, clonus. VSS. Repeat EKG ordered. Unable to obtain accurate reading d/t severity of tremors. Ordered Ativan 1mg  and consulted hospitalist team for evaluation. Contacted poison control who provided the  following recommendations- repeat EKG, repeat CMP [replace K+ to 4-4.2] and magnesium [replace to 2], consider fluids. Recommendations relayed to Dr. Ezzard Standing.  Poison control RN contacted this provider after speaking with toxicologist who recommended supportive therapy with benzodiazepines. Cyproheptadine not recommended at this time. Ongoing monitoring for fever, diaphoresis, tachycardia, and seizure-like activity.   Associated Signs/Symptoms: Depression Symptoms:  depressed mood, anhedonia, feelings of worthlessness/guilt, hopelessness, suicidal thoughts with specific plan, suicidal attempt, anxiety, loss of energy/fatigue, (Hypo) Manic Symptoms:   None Anxiety Symptoms:  Excessive Worry, Panic Symptoms, Social Anxiety, Psychotic Symptoms:   None PTSD Symptoms: Negative Total Time spent with patient: 2.5 hours  Past Psychiatric History: Diagnostic history of MDD and GAD. Past suicide attempts and inpatient hospitalizations. No trauma history. No history of aggression.  Is the patient at risk to self? Yes.    Has the patient been a risk to self in the past 6 months? Yes.    Has the patient been a risk to self within the distant past? Yes.    Is the patient a risk to others? No.  Has the patient been a risk to others in the past 6 months? No.  Has the patient been a risk to others within the distant past? No.   [place] Scale:  Flowsheet Row Admission (Current) from 04/13/2023 in [facility] INPATIENT BEHAVIORAL MEDICINE Most recent reading at 04/13/2023  7:45 PM ED from 04/13/2023 in [facility] Emergency Department at [facility] Most recent reading at 04/13/2023  4:16 AM ED from 04/11/2023 in [facility] Most recent reading at 04/11/2023  1:11 PM  C-SSRS RISK CATEGORY High Risk High Risk No Risk      Substance Abuse History in the last 12 months:  Denies- sober 27 years Consequences of Substance Abuse: NA  Past Medical History:  Past Medical  History:  Diagnosis Date   Allergy    Anal fistula    Anxiety    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 06/15/2018   Cancer of skin of neck    Cataract    Chronic mesenteric ischemia ([name])    Chronic pain syndrome 07/09/2016   Crohn's disease of small and large intestines ([name])    followed by dr Baldo Ash gessner   Dairy product intolerance    Diarrhea, functional    Family history of adverse reaction to anesthesia    Grover's disease    transient acantholytic dermatosis   History of alcohol abuse    History of basal cell carcinoma excision    2013 left leg   History of Clostridium difficile    10/ 2014   History of multiple concussions    x6   last one Jan 2017 per pt--  no residual   History of substance abuse ([name])    quit 1997 per pt   History of suicide attempt    05-18-2012  overdose /  failure to thrive   Iron deficiency anemia due to chronic blood loss    Major depression, recurrent, chronic ([name])    Osteopenia    Personal history of adenomatous colonic polyps 12/2010, 03/2012   12/2010 - 8 mm serrated adenoma of rectum   Portal vein thrombosis 03/21/2015   right   Primary sclerosing cholangitis    ? 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   Substance abuse ([name]) 1997   Alcohol   Vitamin A deficiency 05/23/2018   Vitamin B6 deficiency 07/27/2018   Vitamin C deficiency 05/23/2018    Past Surgical History:  Procedure Laterality Date   ABDOMINAL AORTAGRAM N/A 03/29/2012   Procedure: ABDOMINAL Ronny Flurry;  Surgeon: Nada Libman, MD;  Location: [facility] CATH LAB;  Service: Cardiovascular;  Laterality: N/A;   ADENOIDECTOMY  age 58   BIOPSY  05/31/2018   Procedure: BIOPSY;  Surgeon: Rachael Fee, MD;  Location: [facility] ENDOSCOPY;  Service: Endoscopy;;    CATARACT EXTRACTION W/ INTRAOCULAR LENS  IMPLANT, BILATERAL  2009   COLONOSCOPY  2001, 05/02/2003, 01/28/11   2012: Right colon Crohn's, rectal polyp   COLONOSCOPY  03/31/2012   Procedure: COLONOSCOPY;  Surgeon: Beverley Fiedler, MD;  Location: [facility] ENDOSCOPY;  Service: Gastroenterology;  Laterality: N/A;   COLONOSCOPY WITH PROPOFOL N/A 05/31/2018   Procedure: COLONOSCOPY WITH PROPOFOL;  Surgeon: Rachael Fee, MD;  Location: [facility] ENDOSCOPY;  Service: Endoscopy;  Laterality: N/A;   ESOPHAGOGASTRODUODENOSCOPY  01/28/2011   Normal   FOOT SURGERY Right age 51   MOHS SURGERY Left 11/2013   left ankle parakerotosis    PERCUTANEOUS LIVER BIOPSY  2007 and 2008   PILONIDAL CYST EXCISION  age 59   PLACEMENT OF SETON N/A 11/06/2015   Procedure: PLACEMENT OF SETON;  Surgeon: Romie Levee, MD;  Location: [facility];  Service: General;  Laterality: N/A;   PLACEMENT OF SETON  07/2018   at [facility]   RECTAL EXAM UNDER ANESTHESIA N/A 01/18/2019   Procedure: ANAL EXAM UNDER ANESTHESIA,  INCISION AND DRAINAGE, SETON PLACEMENT;  Surgeon: Romie Levee, MD;  Location: [facility] ;  Service: General;  Laterality: N/A;   UPPER GASTROINTESTINAL  ENDOSCOPY     Family History:  Family History  Problem Relation Age of Onset   Heart disease Father    Thyroid cancer Father    Allergies Father    Clotting disorder Father    Breast cancer Mother    Stomach cancer Mother    Colon cancer Neg Hx    Esophageal cancer Neg Hx    Rectal cancer Neg Hx    Family Psychiatric  History: Patient reports depression and GAD in his mother   Tobacco Screening:  Social History   Tobacco Use  Smoking Status Former   Current packs/day: 0.00   Average packs/day: 1 pack/day for 15.0 years (15.0 ttl pk-yrs)   Types: Cigarettes   Start date: 03/28/1982   Quit date: 03/28/1997   Years since quitting: 26.0  Smokeless Tobacco Never    [facility] Tobacco Counseling     Are you interested in Tobacco Cessation  Medications?  No value filed. Counseled patient on smoking cessation:  No value filed. Reason Tobacco Screening Not Completed: No value filed.       Social History:  Social History   Substance and Sexual Activity  Alcohol Use No   Alcohol/week: 0.0 standard drinks of alcohol     Social History   Substance and Sexual Activity  Drug Use Not Currently   Types: Cocaine, Marijuana   Comment: QUIT USING DRUGS IN 1997    Additional Social History: Marital status: Single Are you sexually active?: No What is your sexual orientation?: "Heterosexual." Has your sexual activity been affected by drugs, alcohol, medication, or emotional stress?: Unable to asse4ss. Does patient have children?: Yes How many children?: 1 How is patient's relationship with their children?: "Ok"                         Allergies:   Allergies  Allergen Reactions   Asacol [Mesalamine] Diarrhea    abd pain   Azathioprine Diarrhea    abd pain   Gluten Meal Diarrhea   Metoprolol Tartrate Nausea Only    Stomach upset   Mycophenolate Mofetil Diarrhea    abd pain   Other Other (See Comments)    NUTS; constipation   Wheat Diarrhea    Constipation; flatulence; abd pain   Amoxicillin Other (See Comments)    Lots of gas   Tape Itching and Rash    Please use "paper" tape   Lab Results:  Results for orders placed or performed during the hospital encounter of 04/13/23 (from the past 48 hours)  Glucose, capillary     Status: Abnormal   Collection Time: 04/14/23  1:55 PM  Result Value Ref Range   Glucose-Capillary 120 (H) 70 - 99 mg/dL    Comment: Glucose reference range applies only to samples taken after fasting for at least 8 hours.  CBC with Differential/Platelet     Status: Abnormal   Collection Time: 04/14/23  3:55 PM  Result Value Ref Range   WBC 12.1 (H) 4.0 - 10.5 K/uL   RBC 4.74 4.22 - 5.81 MIL/uL   Hemoglobin 15.1 13.0 - 17.0 g/dL   HCT 02.7 25.3 - 66.4 %   MCV 93.7 80.0 - 100.0 fL    MCH 31.9 26.0 - 34.0 pg   MCHC 34.0 30.0 - 36.0 g/dL   RDW 40.3 47.4 - 25.9 %   Platelets 219 150 - 400 K/uL   nRBC 0.0 0.0 - 0.2 %   Neutrophils Relative % 86 %  Neutro Abs 10.5 (H) 1.7 - 7.7 K/uL   Lymphocytes Relative 3 %   Lymphs Abs 0.4 (L) 0.7 - 4.0 K/uL   Monocytes Relative 3 %   Monocytes Absolute 0.3 0.1 - 1.0 K/uL   Eosinophils Relative 0 %   Eosinophils Absolute 0.0 0.0 - 0.5 K/uL   Basophils Relative 1 %   Basophils Absolute 0.1 0.0 - 0.1 K/uL   WBC Morphology Mild Left Shift (1-5% metas, occ myelo)    RBC Morphology MORPHOLOGY UNREMARKABLE    Smear Review Normal platelet morphology    Immature Granulocytes 7 %   Abs Immature Granulocytes 0.79 (H) 0.00 - 0.07 K/uL   Smudge Cells PRESENT     Comment: Performed at [facility], [address] [place], [place] [address]  Comprehensive metabolic panel     Status: Abnormal   Collection Time: 04/14/23  3:55 PM  Result Value Ref Range   Sodium 138 135 - 145 mmol/L   Potassium 4.1 3.5 - 5.1 mmol/L   Chloride 103 98 - 111 mmol/L   CO2 26 22 - 32 mmol/L   Glucose, Bld 100 (H) 70 - 99 mg/dL    Comment: Glucose reference range applies only to samples taken after fasting for at least 8 hours.   BUN 29 (H) 6 - 20 mg/dL   Creatinine, Ser 0.86 0.61 - 1.24 mg/dL   Calcium 8.3 (L) 8.9 - 10.3 mg/dL   Total Protein 5.5 (L) 6.5 - 8.1 g/dL   Albumin 2.7 (L) 3.5 - 5.0 g/dL   AST 33 15 - 41 U/L   ALT 41 0 - 44 U/L   Alkaline Phosphatase 108 38 - 126 U/L   Total Bilirubin 0.9 0.0 - 1.2 mg/dL   GFR, Estimated >57 >84 mL/min    Comment: (NOTE) Calculated using the CKD-EPI Creatinine Equation (2021)    Anion gap 9 5 - 15    Comment: Performed at [facility], [address] [place], [place] [address]  CK     Status: Abnormal   Collection Time: 04/14/23  3:55 PM  Result Value Ref Range   Total CK 19 (L) 49 - 397 U/L    Comment: Performed at [facility], [address], [place], [place] [address]   Troponin I (High Sensitivity)     Status: None   Collection Time: 04/14/23  3:55 PM  Result Value Ref Range   Troponin I (High Sensitivity) 4 <18 ng/L    Comment: (NOTE) Elevated high sensitivity troponin I (hsTnI) values and significant  changes across serial measurements may suggest ACS but many other  chronic and acute conditions are known to elevate hsTnI results.  Refer to the "Links" section for chest pain algorithms and additional  guidance. Performed at [facility], [address] [place], [place] [address]    *Note: Due to a large number of results and/or encounters for the requested time period, some results have not been displayed. A complete set of results can be found in Results Review.    Blood Alcohol level:  Lab Results  Component Value Date   ETH <10 04/13/2023   ETH <10 03/28/2023    Metabolic Disorder Labs:  Lab Results  Component Value Date   HGBA1C 4.8 04/03/2023   MPG 91.06 04/03/2023    Lab Results  Component Value Date   CHOL 209 (H) 04/03/2023   TRIG 75 04/03/2023   HDL 65 04/03/2023   CHOLHDL 3.2 04/03/2023   VLDL  15 04/03/2023   LDLCALC 129 (H) 04/03/2023    Current Medications: Current Facility-Administered Medications  Medication Dose Route Frequency Provider Last Rate Last Admin   acetaminophen (TYLENOL) tablet 650 mg  650 mg Oral Q6H PRN Motley-Mangrum, Jadeka A, PMHNP       alum & mag hydroxide-simeth (MAALOX/MYLANTA) 200-200-20 MG/5ML suspension 30 mL  30 mL Oral Q4H PRN Motley-Mangrum, Jadeka A, PMHNP       dicyclomine (BENTYL) capsule 10 mg  10 mg Oral TID AC Motley-Mangrum, Jadeka A, PMHNP   10 mg at 04/14/23 1618   haloperidol (HALDOL) tablet 5 mg  5 mg Oral TID PRN Motley-Mangrum, Jadeka A, PMHNP       And   diphenhydrAMINE (BENADRYL) capsule 50 mg  50 mg Oral TID PRN Motley-Mangrum, Jadeka A, PMHNP       haloperidol lactate (HALDOL) injection 5 mg  5 mg Intramuscular TID PRN Motley-Mangrum, Jadeka A, PMHNP        And   diphenhydrAMINE (BENADRYL) injection 50 mg  50 mg Intramuscular TID PRN Motley-Mangrum, Jadeka A, PMHNP       And   LORazepam (ATIVAN) injection 2 mg  2 mg Intramuscular TID PRN Motley-Mangrum, Jadeka A, PMHNP       haloperidol lactate (HALDOL) injection 10 mg  10 mg Intramuscular TID PRN Motley-Mangrum, Jadeka A, PMHNP       And   diphenhydrAMINE (BENADRYL) injection 50 mg  50 mg Intramuscular TID PRN Motley-Mangrum, Jadeka A, PMHNP       And   LORazepam (ATIVAN) injection 2 mg  2 mg Intramuscular TID PRN Motley-Mangrum, Jadeka A, PMHNP       feeding supplement (ENSURE ENLIVE / ENSURE PLUS) liquid 237 mL  237 mL Oral BID BM Verner Chol, MD       hydrOXYzine (ATARAX) tablet 25 mg  25 mg Oral TID PRN Motley-Mangrum, Jadeka A, PMHNP   25 mg at 04/14/23 0636   LORazepam (ATIVAN) tablet 1 mg  1 mg Oral Q4H PRN Aslan Himes E, NP       magnesium hydroxide (MILK OF MAGNESIA) suspension 30 mL  30 mL Oral Daily PRN Motley-Mangrum, Jadeka A, PMHNP       multivitamin with minerals tablet 1 tablet  1 tablet Oral Daily Verner Chol, MD   1 tablet at 04/14/23 1047   predniSONE (DELTASONE) tablet 30 mg  30 mg Oral Q breakfast Motley-Mangrum, Jadeka A, PMHNP   30 mg at 04/14/23 4540   sodium chloride 0.9 % bolus 1,000 mL  1,000 mL Intravenous Once Floydene Flock, MD       PTA Medications: Medications Prior to Admission  Medication Sig Dispense Refill Last Dose/Taking   busPIRone (BUSPAR) 10 MG tablet Take 1 tablet (10 mg total) by mouth 2 (two) times daily. 30 tablet 0    dicyclomine (BENTYL) 10 MG capsule Take 1 capsule (10 mg total) by mouth 3 (three) times daily before meals. 90 capsule 0    escitalopram (LEXAPRO) 10 MG tablet Take 1 tablet (10 mg total) by mouth at bedtime. 30 tablet 0    mirtazapine (REMERON) 30 MG tablet Take 1 tablet (30 mg total) by mouth at bedtime. 30 tablet 0    predniSONE (DELTASONE) 20 MG tablet Take 1.5 tablets (30 mg total) by mouth daily with breakfast.  100 tablet 2     Musculoskeletal: Strength & Muscle Tone: decreased Gait & Station: unsteady Patient leans: N/A   Psychiatric Specialty Exam:  Presentation  General Appearance:  Disheveled  Eye Contact: Fair  Speech: Clear and Coherent; Normal Rate  Speech Volume: Normal  Handedness: Right   Mood and Affect  Mood: Anxious; Depressed  Affect: Flat   Thought Process  Thought Processes: Coherent  Duration of Psychotic Symptoms:N/A Past Diagnosis of Schizophrenia or Psychoactive disorder: Denies Descriptions of Associations:Intact  Orientation:Full (Time, Place and Person)  Thought Content:Logical  Hallucinations:Denies Ideas of Reference:None  Suicidal Thoughts: Yes Homicidal Thoughts: No  Sensorium  Memory: Immediate Good; Recent Good; Remote Good  Judgment: Poor  Insight: Poor   Executive Functions  Concentration: Poor  Attention Span: Poor  Recall: Fair  Fund of Knowledge: Good  Language: Good   Psychomotor Activity  Psychomotor Activity: agitation  Assets  Assets: Communication Skills; Desire for Improvement; Financial Resources/Insurance; Leisure Time; Housing   Sleep  Sleep:Poor   Physical Exam: Physical Exam Vitals and nursing note reviewed.  Constitutional:      Appearance: He is underweight. He is ill-appearing.  HENT:     Head: Normocephalic. Right periorbital erythema present.  Eyes:     Extraocular Movements: Extraocular movements intact.     Conjunctiva/sclera: Conjunctivae normal.  Pulmonary:     Effort: Pulmonary effort is normal.  Musculoskeletal:     Cervical back: Normal range of motion.  Neurological:     Mental Status: He is oriented to person, place, and time. He is lethargic.     Motor: Weakness and tremor present.  Psychiatric:        Behavior: Behavior is cooperative.    Review of Systems  Constitutional:  Positive for malaise/fatigue.  Neurological:  Positive for tremors and  weakness.  Psychiatric/Behavioral:  Positive for depression and suicidal ideas. The patient is nervous/anxious.    Blood pressure (!) 149/94, pulse 98, temperature 98.4 F (36.9 C), temperature source Oral, resp. rate 20, height 6\' 1"  (1.854 m), weight 60.6 kg, SpO2 100%. Body mass index is 17.61 kg/m.  Treatment Plan Summary:   Principal Problem:   MDD (major depressive disorder), recurrent severe, without psychosis ([name]) Active Problems:   GAD (generalized anxiety disorder)   Tremor  1.    Safety and Monitoring:             --  Involuntary admission to inpatient psychiatric unit for safety, stabilization and treatment             -- Daily contact with patient to assess and evaluate symptoms and progress in treatment             -- Patient's case to be discussed in multi-disciplinary team meeting             -- Observation Level : 1:1 to monitor for change in mental status             -- Vital signs:  q4hours             -- Precautions: suicide, falls, seizure  2. Psychiatric Diagnoses and Treatment:  -- Hold serotonergic and QT prolonging medication  (Lexapro, Buspar, Remeron)                       -- Metabolic profile and EKG monitoring (BMI: 17.61; Lipid Panel: HbgA1c: QTc: 498)             -- Encouraged patient to participate in unit milieu and in scheduled group therapies             -- Short Term Goals: Ability to identify changes in lifestyle to  reduce recurrence of condition will improve, Ability to verbalize feelings will improve, Ability to disclose and discuss suicidal ideas, Ability to demonstrate self-control will improve, Ability to identify and develop effective coping behaviors will improve, Ability to maintain clinical measurements within normal limits will improve, Compliance with prescribed medications will improve, and Ability to identify triggers associated with substance abuse/mental health issues will improve             -- Long Term Goals: Improvement in symptoms  so as ready for discharge  3. Medical Issues Being Addressed:              -- OT consult eval and treat  -- PT consult eval and treat  -- Consult hospitalist d/t concern for serotonin syndrome- tremors, weakness, clonus.  Appreciate consult and recommendations by Dr. Alvester Morin:   Tremor Positive generalized tremor and mild ataxia in the setting of recent Lexapro overdose Does not appear to be in acute serotonin syndrome Does appear to be psychiatric component as patient reports worsening symptoms associated with group activities and large crowds Vital signs stable at present Psychiatry NP discussed case w/ poison control- (rec checking EKG and electrolytes)  Case discussed with Dr. Selina Cooley with neurology Will check baseline labs including CBC, CMP, CK Check CT head as well as chest x-ray EKG pending to rule out QT syndrome Magnesium level Replete electrolytes IV fluid bolus x 1 Pharmacy to consult for medication management and potential overlapping serotonergic compounds  Otherwise monitor TRH will continue to follow the patient.   MDD (major depressive disorder), recurrent severe, without psychosis ([name]) Management per primary team      -- Unintentional weight loss: recommendations from dietician-   -Continue regular diet -Ensure Enlive po TID, each supplement provides 350 kcal and 20 grams of protein -Magic cup TID with meals, each supplement provides 290 kcal and 9 grams of protein  -MVI with minerals daily    4. Discharge Planning:             -- Social work and case management to assist with discharge planning and identification of hospital follow-up needs prior to discharge             -- Estimated LOS: 5-7 days             -- Discharge Concerns: Need to establish a safety plan; Medication compliance and effectiveness             -- Discharge Goals: Return home with outpatient referrals for mental health follow-up including medication management/psychotherapy; patient feels  skilled nursing facility would be more appropriate disposition   I certify that inpatient services furnished can reasonably be expected to improve the patient's condition.    Sabirin Baray Robyne Peers, NP 2/13/20255:17 PM

## 2023-04-14 NOTE — Assessment & Plan Note (Signed)
Management per primary team

## 2023-04-14 NOTE — Group Note (Signed)
Date:  04/14/2023 Time:  10:12 AM  Group Topic/Focus:  Healthy Communication:   The focus of this group is to discuss communication, barriers to communication, as well as healthy ways to communicate with others.    Participation Level:  Did Not Attend   [name] 04/14/2023, 10:12 AM

## 2023-04-14 NOTE — Plan of Care (Signed)
  Problem: Education: Goal: Knowledge of [facility] Education information/materials will improve Outcome: Progressing Goal: Emotional status will improve Outcome: Progressing   Problem: Activity: Goal: Interest or engagement in activities will improve Outcome: Progressing Goal: Sleeping patterns will improve Outcome: Progressing

## 2023-04-14 NOTE — Consult Note (Signed)
Initial Consultation Note   Patient: [name] WUJ:[id number] DOB: [birth year] PCP: Daisy Floro, MD DOA: 04/13/2023 DOS: the patient was seen and examined on 04/14/2023 Primary service: Verner Chol, MD  Referring physician: Darrold Junker NP  Reason for consult: Tremor  Assessment/Plan: Assessment and Plan: Tremor Positive generalized tremor and mild ataxia in the setting of recent Lexapro overdose Does not appear to be in acute serotonin syndrome Does appear to be psychiatric component as patient reports worsening symptoms associated with group activities and large crowds Vital signs stable at present Psychiatry NP discussed case w/ poison control- (rec checking EKG and electrolytes)  Case discussed with Dr. Selina Cooley with neurology Will check baseline labs including CBC, CMP, CK Check CT head as well as chest x-ray EKG pending to rule out QT syndrome Magnesium level Replete electrolytes IV fluid bolus x 1 Pharmacy to consult for medication management and potential overlapping serotonergic compounds  Otherwise monitor  MDD (major depressive disorder), recurrent severe, without psychosis ([name]) Management per primary team         TRH will continue to follow the patient.  HPI: [name] is a 58 y.o. male with past medical history of Crohn's disease, major depressive disorder, substance abuse presenting to behavioral health after intentional Lexapro overdose.  We have been consulted for tremor.  Patient noted to have been recently admitted for Lexapro overdose.  Patient intentionally took roughly 28 Lexapro's.  There was some initial concern for serotonin syndrome with symptomatic improvement after benzodiazepine use per report.  Per psychiatry nurse practitioner, patient had worsening tremor as well as malaise over the course of the day.  On my evaluation, patient is alert at the bedside though he reports worsening fatigue especially with associated group  activities.  Patient feels that social activities are overly stimulating.  Has had mild tremor as well as worsening ataxia.  He is usually able to walk a few steps without assistance now has significant weakness with standing.  No focal hemiparesis or confusion.  No headache.  No reported vision changes.  No reported nausea or vomiting.  No reported chest pain or shortness of breath.  No reported abdominal pain or diarrhea.. Currently in psych unit afebrile, hemodynamically stable.  Satting well on room air.  Labs including CBC, CMP, CK level, troponin are pending.  Chest x-ray as well as CT head are ordered.  EKG pending. Review of Systems: As mentioned in the history of present illness. All other systems reviewed and are negative. 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 06/15/2018   Cancer of skin of neck    Cataract    Chronic mesenteric ischemia ([name])    Chronic pain syndrome 07/09/2016   Crohn's disease of small and large intestines ([name])    followed by dr Baldo Ash gessner   Dairy product intolerance    Diarrhea, functional    Family history of adverse reaction to anesthesia    Grover's disease    transient acantholytic dermatosis   History of alcohol abuse    History of basal cell carcinoma excision    2013 left leg   History of Clostridium difficile    10/ 2014   History of multiple concussions  x6   last one Jan 2017 per pt--  no residual   History of substance abuse ([name])    quit 1997 per pt   History of suicide attempt    05-18-2012  overdose /  failure to thrive   Iron deficiency anemia due to chronic blood loss    Major depression, recurrent, chronic ([name])    Osteopenia    Personal history of adenomatous colonic polyps 12/2010, 03/2012    12/2010 - 8 mm serrated adenoma of rectum   Portal vein thrombosis 03/21/2015   right   Primary sclerosing cholangitis    ? 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   Substance abuse ([name]) 1997   Alcohol   Vitamin A deficiency 05/23/2018   Vitamin B6 deficiency 07/27/2018   Vitamin C deficiency 05/23/2018   Past Surgical History:  Procedure Laterality Date   ABDOMINAL AORTAGRAM N/A 03/29/2012   Procedure: ABDOMINAL Ronny Flurry;  Surgeon: Nada Libman, MD;  Location: [facility] CATH LAB;  Service: Cardiovascular;  Laterality: N/A;   ADENOIDECTOMY  age 7   BIOPSY  05/31/2018   Procedure: BIOPSY;  Surgeon: Rachael Fee, MD;  Location: [facility] ENDOSCOPY;  Service: Endoscopy;;   CATARACT EXTRACTION W/ INTRAOCULAR LENS  IMPLANT, BILATERAL  2009   COLONOSCOPY  2001, 05/02/2003, 01/28/11   2012: Right colon Crohn's, rectal polyp   COLONOSCOPY  03/31/2012   Procedure: COLONOSCOPY;  Surgeon: Beverley Fiedler, MD;  Location: [facility] ENDOSCOPY;  Service: Gastroenterology;  Laterality: N/A;   COLONOSCOPY WITH PROPOFOL N/A 05/31/2018   Procedure: COLONOSCOPY WITH PROPOFOL;  Surgeon: Rachael Fee, MD;  Location: [facility] ENDOSCOPY;  Service: Endoscopy;  Laterality: N/A;   ESOPHAGOGASTRODUODENOSCOPY  01/28/2011   Normal   FOOT SURGERY Right age 63   MOHS SURGERY Left 11/2013   left ankle parakerotosis    PERCUTANEOUS LIVER BIOPSY  2007 and 2008   PILONIDAL CYST EXCISION  age 25   PLACEMENT OF SETON N/A 11/06/2015   Procedure: PLACEMENT OF SETON;  Surgeon: Romie Levee, MD;  Location: [facility];  Service: General;  Laterality: N/A;   PLACEMENT OF SETON  07/2018   at [facility]   RECTAL EXAM UNDER ANESTHESIA N/A 01/18/2019   Procedure: ANAL EXAM UNDER ANESTHESIA,  INCISION AND DRAINAGE, SETON PLACEMENT;  Surgeon: Romie Levee, MD;  Location: [facility];  Service: General;  Laterality: N/A;   UPPER GASTROINTESTINAL  ENDOSCOPY     Social History:  reports that he quit smoking about 26 years ago. His smoking use included cigarettes. He started smoking about 41 years ago. He has a 15 pack-year smoking history. He has never used smokeless tobacco. He reports that he does not currently use drugs after having used the following drugs: Cocaine and Marijuana. He reports that he does not drink alcohol.  Allergies  Allergen Reactions   Asacol [Mesalamine] Diarrhea    abd pain   Azathioprine Diarrhea    abd pain   Gluten Meal Diarrhea   Metoprolol Tartrate Nausea Only    Stomach upset   Mycophenolate Mofetil Diarrhea    abd pain   Other Other (See Comments)    NUTS; constipation   Wheat Diarrhea    Constipation; flatulence; abd pain   Amoxicillin Other (See Comments)    Lots of gas   Tape Itching and Rash    Please use "paper" tape    Family History  Problem Relation  Age of Onset   Heart disease Father    Thyroid cancer Father    Allergies Father    Clotting disorder Father    Breast cancer Mother    Stomach cancer Mother    Colon cancer Neg Hx    Esophageal cancer Neg Hx    Rectal cancer Neg Hx     Prior to Admission medications   Medication Sig Start Date End Date Taking? Authorizing Provider  busPIRone (BUSPAR) 10 MG tablet Take 1 tablet (10 mg total) by mouth 2 (two) times daily. 04/11/23   Ardis Hughs, NP  dicyclomine (BENTYL) 10 MG capsule Take 1 capsule (10 mg total) by mouth 3 (three) times daily before meals. 04/04/23   Starleen Blue, NP  escitalopram (LEXAPRO) 10 MG tablet Take 1 tablet (10 mg total) by mouth at bedtime. 04/11/23   Ardis Hughs, NP  mirtazapine (REMERON) 30 MG tablet Take 1 tablet (30 mg total) by mouth at bedtime. 04/04/23   Starleen Blue, NP  predniSONE (DELTASONE) 20 MG tablet Take 1.5 tablets (30 mg total) by mouth daily with breakfast. 01/17/23   Iva Boop, MD    Physical Exam: Vitals:   04/13/23 1945 04/13/23 1945 04/14/23 0611 04/14/23 1415   BP: 124/74 124/74 135/70 (!) 149/94  Pulse: 99 99 84 98  Resp: 14 14 20 20   Temp: 97.6 F (36.4 C) 97.9 F (36.6 C) 98.2 F (36.8 C) 98.4 F (36.9 C)  TempSrc: Oral Oral  Oral  SpO2: 95% 95% 98% 100%  Weight: 60.6 kg     Height: 6\' 1"  (1.854 m)      Physical Exam Constitutional:      Comments: Underweight  Mildly disshevled appearing    HENT:     Head: Normocephalic.     Nose: Nose normal.  Eyes:     Pupils: Pupils are equal, round, and reactive to light.  Cardiovascular:     Rate and Rhythm: Regular rhythm. Tachycardia present.  Pulmonary:     Effort: Pulmonary effort is normal.  Abdominal:     General: Bowel sounds are normal.  Musculoskeletal:        General: Normal range of motion.  Skin:    General: Skin is warm and dry.  Neurological:     General: No focal deficit present.  Psychiatric:        Mood and Affect: Mood normal.     Data Reviewed:   There are no new results to review at this time.    Family Communication: No family at the bedside  Primary team communication: Plan of care discussed w/ psychiatry NP Thank you very much for involving [place] in the care of your patient.   Greater than 50% was spent in counseling and coordination of care with patient Total encounter time 80 minutes or more  Author: Floydene Flock, MD 04/14/2023 4:43 PM  For on call review [email or web address].

## 2023-04-14 NOTE — Group Note (Signed)
Recreation Therapy Group Note   Group Topic:Healthy Support Systems  Group Date: 04/14/2023 Start Time: 1000 End Time: 1050 Facilitators: Rosina Lowenstein, LRT, CTRS Location:  Craft Room  Group Description: Straw Bridge. In groups or individually, patients were given 10 plastic drinking straws and an equal length of masking tape. Using the materials provided, patients were instructed to build a free-standing bridge-like structure to suspend an everyday item (ex: deck of cards) off the floor or table surface. All materials were required to be used in Secondary school teacher. LRT facilitated post-activity discussion reviewing the importance of having strong and healthy support systems in our lives. LRT discussed how the people in our lives serve as the tape and the deck of cards we placed on top of our straw structure are the stressors we face in daily life. LRT and pts discussed what happens in our life when things get too heavy for [place], and we don't have strong supports outside of the hospital. Pt shared 2 of their healthy supports in their life aloud in the group.   Goal Area(s) Addressed:  Patient will identify 2 healthy supports in their life. Patient will identify skills to successfully complete activity. Patient will identify correlation of this activity to life post-discharge.  Patient will build on frustration tolerance skills. Patient will increase team building and communication skills.    Affect/Mood: N/A   Participation Level: Did not attend    Clinical Observations/Individualized Feedback: Patient did not attend group.   Plan: Continue to engage patient in RT group sessions 2-3x/week.   Rosina Lowenstein, LRT, CTRS 04/14/2023 11:37 AM

## 2023-04-14 NOTE — Progress Notes (Signed)
NUTRITION ASSESSMENT  Pt identified as at risk on the Malnutrition Screen Tool  INTERVENTION:  -Continue regular diet -Ensure Enlive po TID, each supplement provides 350 kcal and 20 grams of protein -Magic cup TID with meals, each supplement provides 290 kcal and 9 grams of protein  -MVI with minerals daily   NUTRITION DIAGNOSIS: Unintentional weight loss related to sub-optimal intake as evidenced by pt report.   Goal: Pt to meet >/= 90% of their estimated nutrition needs.  Monitor:  PO intake  Assessment:  Pt admitted with suicide attempt with intentional overdose. Pt took 28 lexapro pills PTA.   Per chart review, pt with past psychiatric history of MDD and GAD. Also with history of crohn's disease; reports compliance with medications (bentyl, lexapro, mirtazapine, and prednisone). Pt resides with parents, who reports crohn's is often exacerbated with anxiety and has difficulty tolerating medications with certain binders. Diet is very limited secondary to crohn's disease.  Pt currently on a regular diet. No meal completion data available to assess at this time.    Reviewed wt hx; pt has experienced a 26.6% wt loss over the past 5 months, which is significant for time frame. Highly suspect pt with malnutrition, however, unable to identify at time. Pt would greatly benefit from addition of oral nutrition supplements.   Labs reviewed: Na: 134, K: 3.1, CBGS: 84 (inpatient orders for glycemic control are ).    58 y.o. male  Height: Ht Readings from Last 1 Encounters:  04/13/23 6\' 1"  (1.854 m)    Weight: Wt Readings from Last 1 Encounters:  04/13/23 60.6 kg    Weight Hx: Wt Readings from Last 10 Encounters:  04/13/23 60.6 kg  04/04/23 68.5 kg  03/28/23 68.5 kg  03/28/23 82.1 kg  11/19/22 82.1 kg  11/03/22 82.6 kg  05/04/22 84.3 kg  03/11/22 84.5 kg  10/21/21 84.5 kg  07/15/21 83.2 kg    BMI:  Body mass index is 17.61 kg/m. Pt meets criteria for underweight based  on current BMI.  Estimated Nutritional Needs: Kcal: 25-30 kcal/kg Protein: > 1 gram protein/kg Fluid: 1 ml/kcal  Diet Order:  Diet Order             Diet regular Room service appropriate? Yes; Fluid consistency: Thin  Diet effective now                  Pt is also offered choice of unit snacks mid-morning and mid-afternoon.  Pt is eating as desired.   Lab results and medications reviewed.   Levada Schilling, RD, LDN, CDCES Registered Dietitian III Certified Diabetes Care and Education Specialist If unable to reach this RD, please use "RD Inpatient" group chat on secure chat between hours of 8am-4 pm daily

## 2023-04-14 NOTE — Plan of Care (Signed)
Problem: Education: Goal: Emotional status will improve Outcome: Not Progressing Goal: Mental status will improve Outcome: Not Progressing

## 2023-04-14 NOTE — Progress Notes (Signed)
   04/13/23 2200  Psych Admission Type (Psych Patients Only)  Admission Status Involuntary  Psychosocial Assessment  Patient Complaints Depression;Worthlessness;Sadness  Eye Contact None  Facial Expression Flat  Affect Depressed;Sad  Speech Slow;Slurred  Interaction Guarded  Motor Activity Slow;Tremors;Unsteady (generalized weakness)  Appearance/Hygiene Disheveled  Behavior Characteristics Cooperative;Guarded  Aggressive Behavior  Targets Self  Type of Behavior  (Sad and Hopless)  Effect No apparent injury  Thought Process  Coherency Blocking  Content Blaming self  Delusions None reported or observed  Perception Derealization  Hallucination None reported or observed  Judgment Poor  Confusion Mild  Danger to Self  Current suicidal ideation? Verbalizes  Description of Suicide Plan None  Self-Injurious Behavior Self-injurious ideation with potentially lethal plan observed or expressed  Agreement Not to Harm Self Yes  Description of Agreement Verbal  Danger to Others  Danger to Others None reported or observed

## 2023-04-14 NOTE — H&P (Incomplete)
Psychiatric Admission Assessment Adult  Patient Identification: [name] MRN:  [id number] Date of Evaluation:  04/14/2023 Chief Complaint:  MDD (major depressive disorder) [F32.9] Principal Diagnosis: MDD (major depressive disorder), recurrent severe, without psychosis ([name]) Diagnosis:  Principal Problem:   MDD (major depressive disorder), recurrent severe, without psychosis ([name]) Active Problems:   GAD (generalized anxiety disorder)   Tremor  History of Present Illness: [name] is a 58 year old Caucasian male with a history of MDD, anxiety, Crohn's disease, and chronic mesenteric ischemia who presented to [facility] emergency department on 04/13/23 at 0404 for suicide attempt by ingestion of 28 tablets of 10mg  Lexapro at 0200. Per ED documentation, patient had some mild symptoms of serotonin syndrome on arrival, including acute agitation and clonus, with symptomatic improvement with benzodiazepines. Did have a return of symptoms requiring additional dose of lorazepam. Poison control advised the total amount of ingested Lexapro likely is not enough to cause serotonin syndrome by itself and they recommended 8-hour cardiac monitoring. Qtc 498.  Patient was recently hospitalized at [facility] 03/28/23-04/04/23 for SI. He presented to the [facility] ED on 04/04/23 for dehydration.   Upon evaluation today, patient continues to endorse suicidal ideation and feelings of helplessness. He presents with bilateral upper and lower extremity tremors, restlessness, clonus. Repeat EKG ordered. Unable to obtain accurate reading d/t severity of tremors. Ordered Ativan 1mg  and consulted hospitalist team for evaluation. Contacted poison control who provided the following recommendations- repeat EKG, repeat CMP [replace K+ to 4-4.2] and magnesium [replace to 2].  Associated Signs/Symptoms: Depression Symptoms:  depressed mood, anhedonia, feelings of worthlessness/guilt, hopelessness, suicidal thoughts  with specific plan, suicidal attempt, anxiety, loss of energy/fatigue, (Hypo) Manic Symptoms:   None Anxiety Symptoms:  Excessive Worry, Panic Symptoms, Social Anxiety, Psychotic Symptoms:   None PTSD Symptoms: Negative Total Time spent with patient: 2.5 hours  Past Psychiatric History: ***  Is the patient at risk to self? Yes.    Has the patient been a risk to self in the past 6 months? Yes.    Has the patient been a risk to self within the distant past? Yes.    Is the patient a risk to others? No.  Has the patient been a risk to others in the past 6 months? No.  Has the patient been a risk to others within the distant past? No.   [place] Scale:  Flowsheet Row Admission (Current) from 04/13/2023 in [facility] INPATIENT BEHAVIORAL MEDICINE Most recent reading at 04/13/2023  7:45 PM ED from 04/13/2023 in [facility] Emergency Department at [facility] Most recent reading at 04/13/2023  4:16 AM ED from 04/11/2023 in [facility] Most recent reading at 04/11/2023  1:11 PM  C-SSRS RISK CATEGORY High Risk High Risk No Risk      Substance Abuse History in the last 12 months:  Denies- sober 27 years Consequences of Substance Abuse: NA  Past Medical History:  Past Medical History:  Diagnosis Date  . Allergy   . Anal fistula   . Anxiety   . 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 Autoimmune hepatitis ([name]) 01/19/2013   liver function checked every 2 or 3 months sees dr Leone Payor  . Avoidant-restrictive food intake disorder (ARFID) ? 06/15/2018  . Cancer of skin of neck   . Cataract   . Chronic mesenteric ischemia ([name])   . Chronic pain syndrome 07/09/2016  . Crohn's disease of small and large intestines ([name])    followed by dr  carl gessner  . Dairy product intolerance   . Diarrhea, functional   . Family history of adverse reaction to anesthesia   . Grover's disease    transient acantholytic dermatosis  . History of alcohol abuse   . History of  basal cell carcinoma excision    2013 left leg  . History of Clostridium difficile    10/ 2014  . History of multiple concussions    x6   last one Jan 2017 per pt--  no residual  . History of substance abuse ([name])    quit 1997 per pt  . History of suicide attempt    05-18-2012  overdose /  failure to thrive  . Iron deficiency anemia due to chronic blood loss   . Major depression, recurrent, chronic ([name])   . Osteopenia   . Personal history of adenomatous colonic polyps 12/2010, 03/2012   12/2010 - 8 mm serrated adenoma of rectum  . Portal vein thrombosis 03/21/2015   right  . Primary sclerosing cholangitis    ? hepatitis overlap - liver bx x 2 and MRCP  . Seasonal allergies   . 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 Substance abuse ([name]) 1997   Alcohol  . Vitamin A deficiency 05/23/2018  . Vitamin B6 deficiency 07/27/2018  . Vitamin C deficiency 05/23/2018    Past Surgical History:  Procedure Laterality Date  . ABDOMINAL AORTAGRAM N/A 03/29/2012   Procedure: ABDOMINAL AORTAGRAM;  Surgeon: Nada Libman, MD;  Location: [facility] CATH LAB;  Service: Cardiovascular;  Laterality: N/A;  . ADENOIDECTOMY  age 48  . BIOPSY  05/31/2018   Procedure: BIOPSY;  Surgeon: Rachael Fee, MD;  Location: [facility] ENDOSCOPY;  Service: Endoscopy;;  . CATARACT EXTRACTION W/ INTRAOCULAR LENS  IMPLANT, BILATERAL  2009  . COLONOSCOPY  2001, 05/02/2003, 01/28/11   2012: Right colon Crohn's, rectal polyp  . COLONOSCOPY  03/31/2012   Procedure: COLONOSCOPY;  Surgeon: Beverley Fiedler, MD;  Location: [facility] ENDOSCOPY;  Service: Gastroenterology;  Laterality: N/A;  . COLONOSCOPY WITH PROPOFOL N/A 05/31/2018   Procedure: COLONOSCOPY WITH PROPOFOL;  Surgeon: Rachael Fee, MD;  Location: [facility] ENDOSCOPY;  Service: Endoscopy;  Laterality: N/A;  . ESOPHAGOGASTRODUODENOSCOPY  01/28/2011   Normal  . FOOT SURGERY Right age 55  . MOHS SURGERY Left 11/2013   left ankle parakerotosis   . PERCUTANEOUS LIVER BIOPSY  2007 and  2008  . PILONIDAL CYST EXCISION  age 61  . PLACEMENT OF SETON N/A 11/06/2015   Procedure: PLACEMENT OF SETON;  Surgeon: Romie Levee, MD;  Location: [facility];  Service: General;  Laterality: N/A;  . PLACEMENT OF SETON  07/2018   at [facility]  . RECTAL EXAM UNDER ANESTHESIA N/A 01/18/2019   Procedure: ANAL EXAM UNDER ANESTHESIA,  INCISION AND DRAINAGE, SETON PLACEMENT;  Surgeon: Romie Levee, MD;  Location: [facility];  Service: General;  Laterality: N/A;  . UPPER GASTROINTESTINAL ENDOSCOPY     Family History:  Family History  Problem Relation Age of Onset  . Heart disease Father   . Thyroid cancer Father   . Allergies Father   . Clotting disorder Father   . Breast cancer Mother   . Stomach cancer Mother   . Colon cancer Neg Hx   . Esophageal cancer Neg Hx   . Rectal cancer Neg Hx    Family Psychiatric  History: Patient reports depression and GAD in his mother   Tobacco Screening:  Social History   Tobacco Use  Smoking Status Former  . Current packs/day: 0.00  . Average packs/day: 1 pack/day for 15.0 years (15.0 ttl pk-yrs)  . Types: Cigarettes  . Start date: 03/28/1982  . Quit date: 03/28/1997  . Years since quitting: 26.0  Smokeless Tobacco Never    [facility] Tobacco Counseling     Are you interested in Tobacco Cessation Medications?  No value filed. Counseled patient on smoking cessation:  No value filed. Reason Tobacco Screening Not Completed: No value filed.       Social History:  Social History   Substance and Sexual Activity  Alcohol Use No  . Alcohol/week: 0.0 standard drinks of alcohol     Social History   Substance and Sexual Activity  Drug Use Not Currently  . Types: Cocaine, Marijuana   Comment: QUIT USING DRUGS IN 1997    Additional Social History: Marital status: Single Are you sexually active?: No What is your sexual orientation?: "Heterosexual." Has your sexual activity been affected by drugs, alcohol,  medication, or emotional stress?: Unable to asse4ss. Does patient have children?: Yes How many children?: 1 How is patient's relationship with their children?: "Ok"                         Allergies:   Allergies  Allergen Reactions  . Asacol [Mesalamine] Diarrhea    abd pain  . Azathioprine Diarrhea    abd pain  . Gluten Meal Diarrhea  . Metoprolol Tartrate Nausea Only    Stomach upset  . Mycophenolate Mofetil Diarrhea    abd pain  . Other Other (See Comments)    NUTS; constipation  . Wheat Diarrhea    Constipation; flatulence; abd pain  . Amoxicillin Other (See Comments)    Lots of gas  . Tape Itching and Rash    Please use "paper" tape   Lab Results:  Results for orders placed or performed during the hospital encounter of 04/13/23 (from the past 48 hours)  Glucose, capillary     Status: Abnormal   Collection Time: 04/14/23  1:55 PM  Result Value Ref Range   Glucose-Capillary 120 (H) 70 - 99 mg/dL    Comment: Glucose reference range applies only to samples taken after fasting for at least 8 hours.  CBC with Differential/Platelet     Status: Abnormal   Collection Time: 04/14/23  3:55 PM  Result Value Ref Range   WBC 12.1 (H) 4.0 - 10.5 K/uL   RBC 4.74 4.22 - 5.81 MIL/uL   Hemoglobin 15.1 13.0 - 17.0 g/dL   HCT 16.1 09.6 - 04.5 %   MCV 93.7 80.0 - 100.0 fL   MCH 31.9 26.0 - 34.0 pg   MCHC 34.0 30.0 - 36.0 g/dL   RDW 40.9 81.1 - 91.4 %   Platelets 219 150 - 400 K/uL   nRBC 0.0 0.0 - 0.2 %   Neutrophils Relative % 86 %   Neutro Abs 10.5 (H) 1.7 - 7.7 K/uL   Lymphocytes Relative 3 %   Lymphs Abs 0.4 (L) 0.7 - 4.0 K/uL   Monocytes Relative 3 %   Monocytes Absolute 0.3 0.1 - 1.0 K/uL   Eosinophils Relative 0 %   Eosinophils Absolute 0.0 0.0 - 0.5 K/uL   Basophils Relative 1 %   Basophils Absolute 0.1 0.0 - 0.1 K/uL   WBC Morphology Mild Left Shift (1-5% metas, occ myelo)    RBC Morphology MORPHOLOGY UNREMARKABLE    Smear Review Normal platelet morphology  Immature Granulocytes 7 %   Abs Immature Granulocytes 0.79 (H) 0.00 - 0.07 K/uL   Smudge Cells PRESENT     Comment: Performed at [facility], [address] [place], [place] [address]  Comprehensive metabolic panel     Status: Abnormal   Collection Time: 04/14/23  3:55 PM  Result Value Ref Range   Sodium 138 135 - 145 mmol/L   Potassium 4.1 3.5 - 5.1 mmol/L   Chloride 103 98 - 111 mmol/L   CO2 26 22 - 32 mmol/L   Glucose, Bld 100 (H) 70 - 99 mg/dL    Comment: Glucose reference range applies only to samples taken after fasting for at least 8 hours.   BUN 29 (H) 6 - 20 mg/dL   Creatinine, Ser 6.04 0.61 - 1.24 mg/dL   Calcium 8.3 (L) 8.9 - 10.3 mg/dL   Total Protein 5.5 (L) 6.5 - 8.1 g/dL   Albumin 2.7 (L) 3.5 - 5.0 g/dL   AST 33 15 - 41 U/L   ALT 41 0 - 44 U/L   Alkaline Phosphatase 108 38 - 126 U/L   Total Bilirubin 0.9 0.0 - 1.2 mg/dL   GFR, Estimated >54 >09 mL/min    Comment: (NOTE) Calculated using the CKD-EPI Creatinine Equation (2021)    Anion gap 9 5 - 15    Comment: Performed at [facility], [address] [place], [place] [address]  CK     Status: Abnormal   Collection Time: 04/14/23  3:55 PM  Result Value Ref Range   Total CK 19 (L) 49 - 397 U/L    Comment: Performed at [facility], [address], [place], [place] [address]  Troponin I (High Sensitivity)     Status: None   Collection Time: 04/14/23  3:55 PM  Result Value Ref Range   Troponin I (High Sensitivity) 4 <18 ng/L    Comment: (NOTE) Elevated high sensitivity troponin I (hsTnI) values and significant  changes across serial measurements may suggest ACS but many other  chronic and acute conditions are known to elevate hsTnI results.  Refer to the "Links" section for chest pain algorithms and additional  guidance. Performed at [facility], [address] [place], [place] [address]    *Note: Due to a large number of results and/or encounters for the  requested time period, some results have not been displayed. A complete set of results can be found in Results Review.    Blood Alcohol level:  Lab Results  Component Value Date   ETH <10 04/13/2023   ETH <10 03/28/2023    Metabolic Disorder Labs:  Lab Results  Component Value Date   HGBA1C 4.8 04/03/2023   MPG 91.06 04/03/2023    Lab Results  Component Value Date   CHOL 209 (H) 04/03/2023   TRIG 75 04/03/2023   HDL 65 04/03/2023   CHOLHDL 3.2 04/03/2023   VLDL 15 04/03/2023   LDLCALC 129 (H) 04/03/2023    Current Medications: Current Facility-Administered Medications  Medication Dose Route Frequency Provider Last Rate Last Admin  . acetaminophen (TYLENOL) tablet 650 mg  650 mg Oral Q6H PRN Motley-Mangrum, Ezra Sites, PMHNP      . alum & mag hydroxide-simeth (MAALOX/MYLANTA) 200-200-20 MG/5ML suspension 30 mL  30 mL Oral Q4H PRN Motley-Mangrum, Jadeka A, PMHNP      . dicyclomine (BENTYL) capsule 10 mg  10 mg Oral TID AC Motley-Mangrum, Jadeka A, PMHNP   10 mg at 04/14/23 1618  .  haloperidol (HALDOL) tablet 5 mg  5 mg Oral TID PRN Motley-Mangrum, Jadeka A, PMHNP       And  . diphenhydrAMINE (BENADRYL) capsule 50 mg  50 mg Oral TID PRN Motley-Mangrum, Jadeka A, PMHNP      . haloperidol lactate (HALDOL) injection 5 mg  5 mg Intramuscular TID PRN Motley-Mangrum, Jadeka A, PMHNP       And  . diphenhydrAMINE (BENADRYL) injection 50 mg  50 mg Intramuscular TID PRN Motley-Mangrum, Ezra Sites, PMHNP       And  . LORazepam (ATIVAN) injection 2 mg  2 mg Intramuscular TID PRN Motley-Mangrum, Jadeka A, PMHNP      . haloperidol lactate (HALDOL) injection 10 mg  10 mg Intramuscular TID PRN Motley-Mangrum, Jadeka A, PMHNP       And  . diphenhydrAMINE (BENADRYL) injection 50 mg  50 mg Intramuscular TID PRN Motley-Mangrum, Geralynn Ochs A, PMHNP       And  . LORazepam (ATIVAN) injection 2 mg  2 mg Intramuscular TID PRN Motley-Mangrum, Ezra Sites, PMHNP      . feeding supplement (ENSURE ENLIVE / ENSURE  PLUS) liquid 237 mL  237 mL Oral BID BM Verner Chol, MD      . hydrOXYzine (ATARAX) tablet 25 mg  25 mg Oral TID PRN Motley-Mangrum, Jadeka A, PMHNP   25 mg at 04/14/23 0636  . LORazepam (ATIVAN) tablet 1 mg  1 mg Oral Q4H PRN Anushri Casalino E, NP      . magnesium hydroxide (MILK OF MAGNESIA) suspension 30 mL  30 mL Oral Daily PRN Motley-Mangrum, Jadeka A, PMHNP      . multivitamin with minerals tablet 1 tablet  1 tablet Oral Daily Verner Chol, MD   1 tablet at 04/14/23 1047  . predniSONE (DELTASONE) tablet 30 mg  30 mg Oral Q breakfast Motley-Mangrum, Jadeka A, PMHNP   30 mg at 04/14/23 0812  . sodium chloride 0.9 % bolus 1,000 mL  1,000 mL Intravenous Once Floydene Flock, MD       PTA Medications: Medications Prior to Admission  Medication Sig Dispense Refill Last Dose/Taking  . busPIRone (BUSPAR) 10 MG tablet Take 1 tablet (10 mg total) by mouth 2 (two) times daily. 30 tablet 0   . dicyclomine (BENTYL) 10 MG capsule Take 1 capsule (10 mg total) by mouth 3 (three) times daily before meals. 90 capsule 0   . escitalopram (LEXAPRO) 10 MG tablet Take 1 tablet (10 mg total) by mouth at bedtime. 30 tablet 0   . mirtazapine (REMERON) 30 MG tablet Take 1 tablet (30 mg total) by mouth at bedtime. 30 tablet 0   . predniSONE (DELTASONE) 20 MG tablet Take 1.5 tablets (30 mg total) by mouth daily with breakfast. 100 tablet 2     Musculoskeletal: Strength & Muscle Tone: decreased Gait & Station: unsteady Patient leans: N/A   Psychiatric Specialty Exam:  Presentation  General Appearance:  Disheveled  Eye Contact: Good  Speech: Clear and Coherent; Normal Rate  Speech Volume: Normal  Handedness: Right   Mood and Affect  Mood: Anxious; Depressed  Affect: Congruent   Thought Process  Thought Processes: Coherent  Duration of Psychotic Symptoms:N/A Past Diagnosis of Schizophrenia or Psychoactive disorder: Denies Descriptions of Associations:Intact  Orientation:Full  (Time, Place and Person)  Thought Content:Logical  Hallucinations:Denies Ideas of Reference:None  Suicidal Thoughts: Yes Homicidal Thoughts: No  Sensorium  Memory: Immediate Good; Recent Good; Remote Good  Judgment: Poor  Insight: Poor   Executive Functions  Concentration:  Poor  Attention Span: Poor  Recall: Fair  Fund of Knowledge: Good  Language: Good   Psychomotor Activity  Psychomotor Activity: agitation  Assets  Assets: Communication Skills; Desire for Improvement; Financial Resources/Insurance; Leisure Time; Housing   Sleep  Sleep:Poor   Physical Exam: Physical Exam Vitals and nursing note reviewed.  Constitutional:      Appearance: He is underweight. He is ill-appearing.  HENT:     Head: Normocephalic. Right periorbital erythema present.  Eyes:     Extraocular Movements: Extraocular movements intact.     Conjunctiva/sclera: Conjunctivae normal.  Pulmonary:     Effort: Pulmonary effort is normal.  Musculoskeletal:     Cervical back: Normal range of motion.  Neurological:     Mental Status: He is oriented to person, place, and time. He is lethargic.     Motor: Weakness and tremor present.  Psychiatric:        Behavior: Behavior is cooperative.    Review of Systems  Constitutional:  Positive for malaise/fatigue.  Neurological:  Positive for tremors and weakness.  Psychiatric/Behavioral:  Positive for depression and suicidal ideas. The patient is nervous/anxious.    Blood pressure (!) 149/94, pulse 98, temperature 98.4 F (36.9 C), temperature source Oral, resp. rate 20, height 6\' 1"  (1.854 m), weight 60.6 kg, SpO2 100%. Body mass index is 17.61 kg/m.  Treatment Plan Summary:   Principal Problem:   MDD (major depressive disorder), recurrent severe, without psychosis ([name]) Active Problems:   GAD (generalized anxiety disorder)   Tremor  1.    Safety and Monitoring:             --  Involuntary admission to inpatient psychiatric  unit for safety, stabilization and treatment             -- Daily contact with patient to assess and evaluate symptoms and progress in treatment             -- Patient's case to be discussed in multi-disciplinary team meeting             -- Observation Level : 1:1 to monitor for change in mental status             -- Vital signs:  q4hours             -- Precautions: suicide, falls  2. Psychiatric Diagnoses and Treatment:  -- Hold all psychotropic medication (Lexapro, Buspar, Remeron)                       -- Metabolic profile and EKG monitoring (BMI: 17.61; Lipid Panel: HbgA1c: QTc: 498)             -- Encouraged patient to participate in unit milieu and in scheduled group therapies             -- Short Term Goals: Ability to identify changes in lifestyle to reduce recurrence of condition will improve, Ability to verbalize feelings will improve, Ability to disclose and discuss suicidal ideas, Ability to demonstrate self-control will improve, Ability to identify and develop effective coping behaviors will improve, Ability to maintain clinical measurements within normal limits will improve, Compliance with prescribed medications will improve, and Ability to identify triggers associated with substance abuse/mental health issues will improve             -- Long Term Goals: Improvement in symptoms so as ready for discharge  3. Medical Issues Being Addressed:              --  OT consult eval and treat  -- PT consult eval and treat  -- Consult hospitalist d/t concern for serotonin syndrome- tremors, weakness, clonus.  Appreciate consult and recommendations by Dr. Alvester Morin:   Tremor Positive generalized tremor and mild ataxia in the setting of recent Lexapro overdose Does not appear to be in acute serotonin syndrome Does appear to be psychiatric component as patient reports worsening symptoms associated with group activities and large crowds Vital signs stable at present Psychiatry NP discussed case  w/ poison control- (rec checking EKG and electrolytes)  Case discussed with Dr. Selina Cooley with neurology Will check baseline labs including CBC, CMP, CK Check CT head as well as chest x-ray EKG pending to rule out QT syndrome Magnesium level Replete electrolytes IV fluid bolus x 1 Pharmacy to consult for medication management and potential overlapping serotonergic compounds  Otherwise monitor TRH will continue to follow the patient.   MDD (major depressive disorder), recurrent severe, without psychosis ([name]) Management per primary team      -- Unintentional weight loss: recommendations from dietician-   -Continue regular diet -Ensure Enlive po TID, each supplement provides 350 kcal and 20 grams of protein -Magic cup TID with meals, each supplement provides 290 kcal and 9 grams of protein  -MVI with minerals daily    4. Discharge Planning:             -- Social work and case management to assist with discharge planning and identification of hospital follow-up needs prior to discharge             -- Estimated LOS: 5-7 days             -- Discharge Concerns: Need to establish a safety plan; Medication compliance and effectiveness             -- Discharge Goals: Return home with outpatient referrals for mental health follow-up including medication management/psychotherapy; patient feels skilled nursing facility would be more appropriate disposition   I certify that inpatient services furnished can reasonably be expected to improve the patient's condition.    Kiyonna Tortorelli Robyne Peers, NP 2/13/20255:17 PM

## 2023-04-14 NOTE — Group Note (Signed)
Recreation Therapy Group Note   Group Topic:General Recreation  Group Date: 04/14/2023 Start Time: 1530 End Time: 1615 Facilitators: Rosina Lowenstein, LRT, CTRS Location: Courtyard  Group Description: [employer]. LRT and patients played games of basketball, drew with chalk, and played corn hole while outside in the courtyard while getting fresh air and sunlight. Music was being played in the background. LRT and peers conversed about different games they have played before, what they do in their free time and anything else that is on their minds. LRT encouraged pts to drink water after being outside, sweating and getting their heart rate up.  Goal Area(s) Addressed: Patient will build on frustration tolerance skills. Patients will partake in a competitive play game with peers. Patients will gain knowledge of new leisure interest/hobby.    Affect/Mood: N/A   Participation Level: Did not attend    Clinical Observations/Individualized Feedback: Patient did not attend group.   Plan: Continue to engage patient in RT group sessions 2-3x/week.   Rosina Lowenstein, LRT, CTRS 04/14/2023 5:39 PM

## 2023-04-14 NOTE — Progress Notes (Signed)
Patient appears sad and depressed. States " I feel hopeless. I cannot do anything by myself. That's why I don't want to live anymore." Patient noted tremors on all extremities on and off. Ativan 1 mg helped to subside tremors. Patient needed assistance to transfer from bed to chair.Patient able to feed himself. Patient verbalized suicidal thoughts. States " I know I can not do anything here." Patient refused Ensure states " I cannot tolerate milk." Patient compliant with medications. Patient on 1:1 for safety now. Support and encouragement given.

## 2023-04-14 NOTE — Progress Notes (Signed)
CT head imaging concerning for cellulitis  Will start on oral doxy in setting of PCN allergy.

## 2023-04-15 DIAGNOSIS — F332 Major depressive disorder, recurrent severe without psychotic features: Secondary | ICD-10-CM | POA: Diagnosis not present

## 2023-04-15 LAB — MAGNESIUM: Magnesium: 2.3 mg/dL (ref 1.7–2.4)

## 2023-04-15 MED ORDER — LORAZEPAM 2 MG/ML IJ SOLN
1.0000 mg | INTRAMUSCULAR | Status: DC | PRN
  Filled 2023-04-15: qty 1

## 2023-04-15 NOTE — Evaluation (Signed)
Physical Therapy Evaluation Patient Details Name: [name] MRN: [id number] DOB: 23-Mar-1965 Today's Date: 04/15/2023  History of Present Illness  Gor Vestal is a 58 year old Caucasian male with a history of MDD, anxiety, Crohn's disease, and chronic mesenteric ischemia who presented to ED on 04/13/23 at 0404 for suicide attempt by ingestion of 28 tablets of 10mg  Lexapro at 0200. Patient sent to behavior Med unit.   Clinical Impression  Patient received sitting in transport chair in day room. He is pleasant and agreeable to PT assessment. Patient Is able to stand with cga and ambulated approx 60 feet with RW and cga. Patient limited by fatigue. He will continue to benefit from skilled PT to improve endurance and strength for improved mobility and safety.        If plan is discharge home, recommend the following: A little help with walking and/or transfers;A little help with bathing/dressing/bathroom;Assist for transportation;Help with stairs or ramp for entrance;Assistance with cooking/housework;Direct supervision/assist for financial management;Direct supervision/assist for medications management   Can travel by private vehicle   Yes    Equipment Recommendations None recommended by PT  Recommendations for Other Services       Functional Status Assessment Patient has had a recent decline in their functional status and demonstrates the ability to make significant improvements in function in a reasonable and predictable amount of time.     Precautions / Restrictions Precautions Precautions: Fall Restrictions Weight Bearing Restrictions Per Provider Order: No      Mobility  Bed Mobility               General bed mobility comments: Patient received up in transport chair    Transfers Overall transfer level: Needs assistance Equipment used: Rolling walker (2 wheels) Transfers: Sit to/from Stand Sit to Stand: Contact guard assist                 Ambulation/Gait Ambulation/Gait assistance: Contact guard assist Gait Distance (Feet): 60 Feet Assistive device: Rolling walker (2 wheels) Gait Pattern/deviations: Decreased step length - right, Decreased step length - left, Decreased stride length, Decreased dorsiflexion - right, Decreased dorsiflexion - left, Trunk flexed Gait velocity: decr     General Gait Details: forward head posture. Shuffle gait.  Stairs            Wheelchair Mobility     Tilt Bed    Modified Rankin (Stroke Patients Only)       Balance Overall balance assessment: Needs assistance Sitting-balance support: Feet supported Sitting balance-Leahy Scale: Good     Standing balance support: Bilateral upper extremity supported, During functional activity, Reliant on assistive device for balance Standing balance-Leahy Scale: Fair                               Pertinent Vitals/Pain Pain Assessment Pain Assessment: No/denies pain    Home Living Family/patient expects to be discharged to:: Private residence Living Arrangements: Parent Available Help at Discharge: Family;Available PRN/intermittently Type of Home: House       Alternate Level Stairs-Number of Steps: 12 Home Layout: Two level Home Equipment: Agricultural consultant (2 wheels)      Prior Function Prior Level of Function : Independent/Modified Independent             Mobility Comments: Patient has not been mobilizing much at home lately ADLs Comments: not getting up to shower or do anything lately     Extremity/Trunk Assessment   Upper Extremity Assessment  Upper Extremity Assessment: Defer to OT evaluation    Lower Extremity Assessment Lower Extremity Assessment: Generalized weakness    Cervical / Trunk Assessment Cervical / Trunk Assessment: Normal  Communication   Communication Communication: No apparent difficulties    Cognition Arousal: Alert Behavior During Therapy: Flat affect   PT - Cognitive  impairments: No apparent impairments                         Following commands: Intact       Cueing Cueing Techniques: Verbal cues     General Comments      Exercises     Assessment/Plan    PT Assessment Patient needs continued PT services  PT Problem List Decreased strength;Decreased activity tolerance;Decreased balance;Decreased mobility       PT Treatment Interventions DME instruction;Functional mobility training;Therapeutic activities;Therapeutic exercise;Balance training;Cognitive remediation;Patient/family education;Stair training;Gait training    PT Goals (Current goals can be found in the Care Plan section)  Acute Rehab PT Goals Patient Stated Goal: to get help PT Goal Formulation: With patient Time For Goal Achievement: 04/29/23 Potential to Achieve Goals: Good    Frequency Min 1X/week     Co-evaluation               AM-PAC PT "6 Clicks" Mobility  Outcome Measure Help needed turning from your back to your side while in a flat bed without using bedrails?: A Little Help needed moving from lying on your back to sitting on the side of a flat bed without using bedrails?: A Little Help needed moving to and from a bed to a chair (including a wheelchair)?: A Little Help needed standing up from a chair using your arms (e.g., wheelchair or bedside chair)?: A Little Help needed to walk in hospital room?: A Little Help needed climbing 3-5 steps with a railing? : A Little 6 Click Score: 18    End of Session   Activity Tolerance: Patient limited by fatigue Patient left: in chair Nurse Communication: Mobility status PT Visit Diagnosis: Muscle weakness (generalized) (M62.81);Difficulty in walking, not elsewhere classified (R26.2)    Time: 1610-9604 PT Time Calculation (min) (ACUTE ONLY): 10 min   Charges:   PT Evaluation $PT Eval Low Complexity: 1 Low   PT General Charges $$ ACUTE PT VISIT: 1 Visit         Eilis Chestnutt, PT,  [facility] 04/15/23,3:24 PM

## 2023-04-15 NOTE — BH Assessment (Signed)
Patient remains safe on unit with sitter at the bedside. Q15 safety checks are being performed.

## 2023-04-15 NOTE — BH Assessment (Signed)
Patient denies current si,hi and avh. Moderate tremors present. Patient received atrax and ativan prn given for anxiety. Patient denied pain at moment. One to one sitter at the bedside.

## 2023-04-15 NOTE — Progress Notes (Signed)
Progress Note   Patient: [name] ZOX:[id number] DOB: [birth year] DOA: 04/13/2023     2 DOS: the patient was seen and examined on 04/15/2023   Brief hospital course:  HPI "[name] is a 58 y.o. male with past medical history of Crohn's disease, major depressive disorder, substance abuse presenting to behavioral health after intentional Lexapro overdose.  We have been consulted for tremor.  Patient noted to have been recently admitted for Lexapro overdose.  Patient intentionally took roughly 28 Lexapro's.  There was some initial concern for serotonin syndrome with symptomatic improvement after benzodiazepine use per report.  Per psychiatry nurse practitioner, patient had worsening tremor as well as malaise over the course of the day.  On my evaluation, patient is alert at the bedside though he reports worsening fatigue especially with associated group activities.  Patient feels that social activities are overly stimulating.  Has had mild tremor as well as worsening ataxia.  He is usually able to walk a few steps without assistance now has significant weakness with standing."   Assessment and Plan: Tremor of unclear etiology Positive generalized tremor and mild ataxia in the setting of recent Lexapro overdose Does not appear to be in acute serotonin syndrome Does appear to be psychiatric component as patient reports worsening symptoms associated with group activities and large crowds Also patient reports being able to control this tremor on 1 side of his body. CT scan of the brain did not show acute intracranial pathology EKG did not show significant change Patient received IV fluid bolus Pharmacy to consult for medication management and potential overlapping serotonergic compounds  We will continue to monitor closely    Right maxillary sinusitis Continue current antibiotic therapy  MDD (major depressive disorder), recurrent severe, without psychosis ([name]) Management per  primary team     Subjective:  Patient seen and examined at bedside this morning Having tremors involving the right hand as well as the right foot When questioned why this check is only involving the right patient tells me he does not want to get both sides tired at the same time and so he changes over to either side at that point in time. He still feels depressed and does not feel life is worth living.   Physical Exam: Vitals:   04/13/23 1945 04/14/23 0611 04/14/23 1415 04/15/23 0555  BP: 124/74 135/70 (!) 149/94 (!) 153/98  Pulse: 99 84 98 73  Resp: 14 20 20 16   Temp: 97.9 F (36.6 C) 98.2 F (36.8 C) 98.4 F (36.9 C) (!) 97.3 F (36.3 C)  TempSrc: Oral  Oral   SpO2: 95% 98% 100%   Weight:      Height:        Data Reviewed:    Latest Ref Rng & Units 04/14/2023    3:55 PM 04/13/2023    4:27 AM 04/03/2023    6:23 PM  BMP  Glucose 70 - 99 mg/dL 811  75  914   BUN 6 - 20 mg/dL 29  14  19    Creatinine 0.61 - 1.24 mg/dL 7.82  9.56  2.13   Sodium 135 - 145 mmol/L 138  134  131   Potassium 3.5 - 5.1 mmol/L 4.1  3.1  4.1   Chloride 98 - 111 mmol/L 103  99  95   CO2 22 - 32 mmol/L 26  23  27    Calcium 8.9 - 10.3 mg/dL 8.3  8.1  8.3        Latest  Ref Rng & Units 04/14/2023    3:55 PM 04/13/2023    4:27 AM 04/03/2023    6:23 PM  CBC  WBC 4.0 - 10.5 K/uL 12.1  12.4  9.0   Hemoglobin 13.0 - 17.0 g/dL 09.8  11.9  14.7   Hematocrit 39.0 - 52.0 % 44.4  46.4  47.4   Platelets 150 - 400 K/uL 219  200  166      Time spent: 46 minutes  Author: Loyce Dys, MD 04/15/2023 3:59 PM  For on call review [email or web address].

## 2023-04-15 NOTE — Progress Notes (Signed)
 [facility] MD Progress Note  04/15/2023 7:22 PM KAJ VASIL  MRN:  [id number]  [name] is a 58 year old Caucasian male with a history of MDD, anxiety, Crohn's disease, and chronic mesenteric ischemia who presented to [facility] emergency department on 04/13/23 at 0404 for suicide attempt by ingestion of 28 tablets of 10mg  Lexapro at 0200. Per ED documentation, patient had some mild symptoms of serotonin syndrome on arrival, including acute agitation and clonus, with symptomatic improvement with benzodiazepines. Did have a return of symptoms requiring additional dose of lorazepam. Poison control advised the total amount of ingested Lexapro likely is not enough to cause serotonin syndrome by itself and they recommended 8-hour cardiac monitoring. Qtc 498.  Subjective:  Chart reviewed, case discussed in multidisciplinary meeting, patient seen during rounds.  On interview patient is noted to be resting in bed.  Reports hard to wake him up.  He was getting intermittent Ativan to help with the tremors.  He denies HI.  He continues to endorse feeling depressed and anxious.  He reports feeling life is not worth living but denies any active SI.  Patient denies auditory/visual hallucinations.  Patient was encouraged to keep up with the nutrition and hydration.  Patient reports feeling tired and needing to sleep.   Sleep: Fair  Appetite:  Fair  Past Psychiatric History: see h&P Family History:  Family History  Problem Relation Age of Onset   Heart disease Father    Thyroid cancer Father    Allergies Father    Clotting disorder Father    Breast cancer Mother    Stomach cancer Mother    Colon cancer Neg Hx    Esophageal cancer Neg Hx    Rectal cancer Neg Hx    Social History:  Social History   Substance and Sexual Activity  Alcohol Use No   Alcohol/week: 0.0 standard drinks of alcohol     Social History   Substance and Sexual Activity  Drug Use Not Currently   Types: Cocaine,  Marijuana   Comment: QUIT USING DRUGS IN 1997    Social History   Socioeconomic History   Marital status: Single    Spouse name: Not on file   Number of children: 1   Years of education: Not on file   Highest education level: Not on file  Occupational History   Occupation: Disability   Tobacco Use   Smoking status: Former    Current packs/day: 0.00    Average packs/day: 1 pack/day for 15.0 years (15.0 ttl pk-yrs)    Types: Cigarettes    Start date: 03/28/1982    Quit date: 03/28/1997    Years since quitting: 26.0   Smokeless tobacco: Never  Vaping Use   Vaping status: Never Used  Substance and Sexual Activity   Alcohol use: No    Alcohol/week: 0.0 standard drinks of alcohol   Drug use: Not Currently    Types: Cocaine, Marijuana    Comment: QUIT USING DRUGS IN 1997   Sexual activity: Not Currently  Other Topics Concern   Not on file  Social History Narrative   Single, history of substance abuse in recovery   Previously occupied on medical disability   1 child  Daughter Wellsite geologist, lives and works in [place]   Elderly parents involved and he helps them   1 brother   Social Drivers of Corporate investment banker Strain: Low Risk  (08/28/2019)   Overall Financial Resource Strain (CARDIA)    Difficulty of Paying Living  Expenses: Not hard at all  Food Insecurity: Food Insecurity Present (04/13/2023)   Hunger Vital Sign    Worried About Running Out of Food in the Last Year: Never true    Ran Out of Food in the Last Year: Sometimes true  Transportation Needs: No Transportation Needs (04/13/2023)   PRAPARE - Administrator, Civil Service (Medical): No    Lack of Transportation (Non-Medical): No  Physical Activity: Insufficiently Active (11/08/2019)   Exercise Vital Sign    Days of Exercise per Week: 3 days    Minutes of Exercise per Session: 30 min  Stress: Stress Concern Present (08/28/2019)   [employer] of Occupational Health - Occupational Stress  Questionnaire    Feeling of Stress : Very much  Social Connections: Socially Isolated (03/28/2023)   Social Connection and Isolation Panel [NHANES]    Frequency of Communication with Friends and Family: Three times a week    Frequency of Social Gatherings with Friends and Family: Twice a week    Attends Religious Services: Never    Database administrator or Organizations: No    Attends Engineer, structural: Never    Marital Status: Divorced   Past Medical History:  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 06/15/2018   Cancer of skin of neck    Cataract    Chronic mesenteric ischemia ([name])    Chronic pain syndrome 07/09/2016   Crohn's disease of small and large intestines ([name])    followed by dr Baldo Ash gessner   Dairy product intolerance    Diarrhea, functional    Family history of adverse reaction to anesthesia    Grover's disease    transient acantholytic dermatosis   History of alcohol abuse    History of basal cell carcinoma excision    2013 left leg   History of Clostridium difficile    10/ 2014   History of multiple concussions    x6   last one Jan 2017 per pt--  no residual   History of substance abuse ([name])    quit 1997 per pt   History of suicide attempt    05-18-2012  overdose /  failure to thrive   Iron deficiency anemia due to chronic blood loss    Major depression, recurrent, chronic ([name])    Osteopenia    Personal history of adenomatous colonic polyps 12/2010, 03/2012   12/2010 - 8 mm serrated adenoma of rectum   Portal vein thrombosis 03/21/2015   right   Primary sclerosing cholangitis    ? 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   Substance abuse  ([name]) 1997   Alcohol   Vitamin A deficiency 05/23/2018   Vitamin B6 deficiency 07/27/2018   Vitamin C deficiency 05/23/2018    Past Surgical History:  Procedure Laterality Date   ABDOMINAL AORTAGRAM N/A 03/29/2012   Procedure: ABDOMINAL Ronny Flurry;  Surgeon: Nada Libman, MD;  [facility] CATH LAB;  Service: Cardiovascular;  Laterality: N/A;   ADENOIDECTOMY  age 4   BIOPSY  05/31/2018   Procedure: BIOPSY;  Surgeon: Rachael Fee, MD;  Location: [facility] ENDOSCOPY;  Service:  Endoscopy;;   CATARACT EXTRACTION W/ INTRAOCULAR LENS  IMPLANT, BILATERAL  2009   COLONOSCOPY  2001, 05/02/2003, 01/28/11   2012: Right colon Crohn's, rectal polyp   COLONOSCOPY  03/31/2012   Procedure: COLONOSCOPY;  Surgeon: Beverley Fiedler, MD;  Location: [facility] ENDOSCOPY;  Service: Gastroenterology;  Laterality: N/A;   COLONOSCOPY WITH PROPOFOL N/A 05/31/2018   Procedure: COLONOSCOPY WITH PROPOFOL;  Surgeon: Rachael Fee, MD;  Location: [facility] ENDOSCOPY;  Service: Endoscopy;  Laterality: N/A;   ESOPHAGOGASTRODUODENOSCOPY  01/28/2011   Normal   FOOT SURGERY Right age 2   MOHS SURGERY Left 11/2013   left ankle parakerotosis    PERCUTANEOUS LIVER BIOPSY  2007 and 2008   PILONIDAL CYST EXCISION  age 3   PLACEMENT OF SETON N/A 11/06/2015   Procedure: PLACEMENT OF SETON;  Surgeon: Romie Levee, MD;  Location: [facility];  Service: General;  Laterality: N/A;   PLACEMENT OF SETON  07/2018   at [facility]   RECTAL EXAM UNDER ANESTHESIA N/A 01/18/2019   Procedure: ANAL EXAM UNDER ANESTHESIA,  INCISION AND DRAINAGE, SETON PLACEMENT;  Surgeon: Romie Levee, MD;  Location: [facility];  Service: General;  Laterality: N/A;   UPPER GASTROINTESTINAL ENDOSCOPY      Current Medications: Current Facility-Administered Medications  Medication Dose Route Frequency Provider Last Rate Last Admin   acetaminophen (TYLENOL) tablet 650 mg  650 mg Oral Q6H PRN Motley-Mangrum, Jadeka A, PMHNP       alum & mag  hydroxide-simeth (MAALOX/MYLANTA) 200-200-20 MG/5ML suspension 30 mL  30 mL Oral Q4H PRN Motley-Mangrum, Jadeka A, PMHNP       dicyclomine (BENTYL) capsule 10 mg  10 mg Oral TID AC Motley-Mangrum, Jadeka A, PMHNP   10 mg at 04/15/23 1757   doxycycline (VIBRA-TABS) tablet 100 mg  100 mg Oral Q12H Floydene Flock, MD   100 mg at 04/15/23 0801   hydrOXYzine (ATARAX) tablet 25 mg  25 mg Oral TID PRN Motley-Mangrum, Geralynn Ochs A, PMHNP   25 mg at 04/15/23 0436   LORazepam (ATIVAN) injection 1 mg  1 mg Intramuscular Q4H PRN Remington, Amber E, NP       LORazepam (ATIVAN) injection 2 mg  2 mg Intramuscular TID PRN Motley-Mangrum, Jadeka A, PMHNP       LORazepam (ATIVAN) tablet 1 mg  1 mg Oral Q4H PRN Remington, Amber E, NP   1 mg at 04/15/23 1413   magnesium hydroxide (MILK OF MAGNESIA) suspension 30 mL  30 mL Oral Daily PRN Motley-Mangrum, Jadeka A, PMHNP       multivitamin with minerals tablet 1 tablet  1 tablet Oral Daily Verner Chol, MD   1 tablet at 04/15/23 0801   predniSONE (DELTASONE) tablet 30 mg  30 mg Oral Q breakfast Motley-Mangrum, Jadeka A, PMHNP   30 mg at 04/15/23 0801    Lab Results:  Results for orders placed or performed during the hospital encounter of 04/13/23 (from the past 48 hours)  Glucose, capillary     Status: Abnormal   Collection Time: 04/14/23  1:55 PM  Result Value Ref Range   Glucose-Capillary 120 (H) 70 - 99 mg/dL    Comment: Glucose reference range applies only to samples taken after fasting for at least 8 hours.  CBC with Differential/Platelet     Status: Abnormal   Collection Time: 04/14/23  3:55 PM  Result Value Ref Range   WBC 12.1 (H) 4.0 - 10.5 K/uL   RBC 4.74 4.22 - 5.81 MIL/uL  Hemoglobin 15.1 13.0 - 17.0 g/dL   HCT 40.9 81.1 - 91.4 %   MCV 93.7 80.0 - 100.0 fL   MCH 31.9 26.0 - 34.0 pg   MCHC 34.0 30.0 - 36.0 g/dL   RDW 78.2 95.6 - 21.3 %   Platelets 219 150 - 400 K/uL   nRBC 0.0 0.0 - 0.2 %   Neutrophils Relative % 86 %   Neutro Abs 10.5 (H) 1.7  - 7.7 K/uL   Lymphocytes Relative 3 %   Lymphs Abs 0.4 (L) 0.7 - 4.0 K/uL   Monocytes Relative 3 %   Monocytes Absolute 0.3 0.1 - 1.0 K/uL   Eosinophils Relative 0 %   Eosinophils Absolute 0.0 0.0 - 0.5 K/uL   Basophils Relative 1 %   Basophils Absolute 0.1 0.0 - 0.1 K/uL   WBC Morphology Mild Left Shift (1-5% metas, occ myelo)    RBC Morphology MORPHOLOGY UNREMARKABLE    Smear Review Normal platelet morphology    Immature Granulocytes 7 %   Abs Immature Granulocytes 0.79 (H) 0.00 - 0.07 K/uL   Smudge Cells PRESENT     Comment: Performed at [facility] [address] [place], [place] [address]  Comprehensive metabolic panel     Status: Abnormal   Collection Time: 04/14/23  3:55 PM  Result Value Ref Range   Sodium 138 135 - 145 mmol/L   Potassium 4.1 3.5 - 5.1 mmol/L   Chloride 103 98 - 111 mmol/L   CO2 26 22 - 32 mmol/L   Glucose, Bld 100 (H) 70 - 99 mg/dL    Comment: Glucose reference range applies only to samples taken after fasting for at least 8 hours.   BUN 29 (H) 6 - 20 mg/dL   Creatinine, Ser 8.46 0.61 - 1.24 mg/dL   Calcium 8.3 (L) 8.9 - 10.3 mg/dL   Total Protein 5.5 (L) 6.5 - 8.1 g/dL   Albumin 2.7 (L) 3.5 - 5.0 g/dL   AST 33 15 - 41 U/L   ALT 41 0 - 44 U/L   Alkaline Phosphatase 108 38 - 126 U/L   Total Bilirubin 0.9 0.0 - 1.2 mg/dL   GFR, Estimated >96 >29 mL/min    Comment: (NOTE) Calculated using the CKD-EPI Creatinine Equation (2021)    Anion gap 9 5 - 15    Comment: Performed at [facility], [address] [place], [place] [address]  CK     Status: Abnormal   Collection Time: 04/14/23  3:55 PM  Result Value Ref Range   Total CK 19 (L) 49 - 397 U/L    Comment: Performed at [facility], [address] [place], [place] [address]  Troponin I (High Sensitivity)     Status: None   Collection Time: 04/14/23  3:55 PM  Result Value Ref Range   Troponin I (High Sensitivity) 4 <18 ng/L    Comment: (NOTE) Elevated high  sensitivity troponin I (hsTnI) values and significant  changes across serial measurements may suggest ACS but many other  chronic and acute conditions are known to elevate hsTnI results.  Refer to the "Links" section for chest pain algorithms and additional  guidance. Performed at [facility], [address] [place], [place] [address]   Magnesium     Status: None   Collection Time: 04/15/23  7:55 AM  Result Value Ref Range   Magnesium 2.3 1.7 - 2.4 mg/dL    Comment: Performed at [facility], [address] [place], [place] [address]   *Note: Due to a large number of results and/or encounters for the requested time period, some results have not been displayed. A complete set of results can be found in Results Review.    Blood Alcohol level:  Lab Results  Component Value Date   ETH <10 04/13/2023   ETH <10 03/28/2023    Metabolic Disorder Labs: Lab Results  Component Value Date   HGBA1C 4.8 04/03/2023   MPG 91.06 04/03/2023   No results found for: "PROLACTIN" Lab Results  Component Value Date   CHOL 209 (H) 04/03/2023   TRIG 75 04/03/2023   HDL 65 04/03/2023   CHOLHDL 3.2 04/03/2023   VLDL 15 04/03/2023   LDLCALC 129 (H) 04/03/2023    Physical Findings: AIMS:  , ,  ,  ,    CIWA:    COWS:      Psychiatric Specialty Exam:  Presentation  General Appearance:  Appropriate for Environment; Casual  Eye Contact: Fair  Speech: Clear and Coherent  Speech Volume: Decreased    Mood and Affect  Mood: Depressed; Anxious  Affect: Appropriate; Depressed   Thought Process  Thought Processes: Coherent  Descriptions of Associations:Intact  Orientation:Full (Time, Place and Person)  Thought Content:Logical  Hallucinations:Hallucinations: None  Ideas of Reference:None  Suicidal Thoughts:Suicidal Thoughts: No  Homicidal Thoughts:Homicidal Thoughts: No   Sensorium  Memory: Immediate Fair; Recent Fair; Remote  Fair  Judgment: Impaired  Insight: Shallow   Executive Functions  Concentration: Poor  Attention Span: Poor  Recall: Fair  Fund of Knowledge: Fair  Language: Fair   Psychomotor Activity  Psychomotor Activity: Psychomotor Activity: Normal  Musculoskeletal: Strength & Muscle Tone: decreased Gait & Station: unsteady Assets  Assets: Manufacturing systems engineer; Desire for Improvement; Physical Health    Physical Exam: Physical Exam Vitals and nursing note reviewed.  HENT:     Head: Normocephalic.     Nose: Nose normal.     Mouth/Throat:     Mouth: Mucous membranes are moist.  Eyes:     Pupils: Pupils are equal, round, and reactive to light.  Pulmonary:     Breath sounds: Normal breath sounds.  Abdominal:     General: Bowel sounds are normal.  Neurological:     General: No focal deficit present.     Mental Status: He is alert.    Review of Systems  Constitutional:  Positive for malaise/fatigue.  HENT: Negative.    Eyes: Negative.   Cardiovascular: Negative.   Gastrointestinal: Negative.   Skin: Negative.   Blood pressure 129/81, pulse 75, temperature 98.2 F (36.8 C), resp. rate 18, height 6\' 1"  (1.854 m), weight 60.6 kg, SpO2 99%. Body mass index is 17.61 kg/m.  Diagnosis: Principal Problem:   MDD (major depressive disorder), recurrent severe, without psychosis ([name]) Active Problems:   GAD (generalized anxiety disorder)   Tremor   PLAN: Safety and Monitoring:  -- Involuntary admission to inpatient psychiatric unit for safety, stabilization and treatment  -- Daily contact with patient to assess and evaluate symptoms and progress in treatment  -- Patient's case to be discussed in multi-disciplinary team meeting  -- Observation Level : q15 minute checks  -- Vital signs:  q12 hours  -- Precautions: suicide, elopement, and assault -- Encouraged patient to participate in unit milieu and in scheduled group therapies  2. Psychiatric Diagnoses and  Treatment:    Hold serotonergic and QT prolonging medication  (Lexapro, Buspar, Remeron)                       --  Metabolic profile and EKG monitoring (BMI: 17.61; Lipid Panel: HbgA1c: QTc: 498)     3. Medical Issues Being Addressed:   Working out on serotonin syndrome  4. Discharge Planning:   -- Social work and case management to assist with discharge planning and identification of hospital follow-up needs prior to discharge  -- Estimated LOS: 3-4 days  Verner Chol, MD 04/15/2023, 7:22 PM

## 2023-04-15 NOTE — BH IP Treatment Plan (Addendum)
Interdisciplinary Treatment and Diagnostic Plan Update  04/15/2023 Time of Session: 10:22 AM  SAMIER JACO MRN: [id number]  Principal Diagnosis: MDD (major depressive disorder), recurrent severe, without psychosis ([name])  Secondary Diagnoses: Principal Problem:   MDD (major depressive disorder), recurrent severe, without psychosis ([name]) Active Problems:   GAD (generalized anxiety disorder)   Tremor   Current Medications:  Current Facility-Administered Medications  Medication Dose Route Frequency Provider Last Rate Last Admin   acetaminophen (TYLENOL) tablet 650 mg  650 mg Oral Q6H PRN Motley-Mangrum, Jadeka A, PMHNP       alum & mag hydroxide-simeth (MAALOX/MYLANTA) 200-200-20 MG/5ML suspension 30 mL  30 mL Oral Q4H PRN Motley-Mangrum, Jadeka A, PMHNP       dicyclomine (BENTYL) capsule 10 mg  10 mg Oral TID AC Motley-Mangrum, Jadeka A, PMHNP   10 mg at 04/15/23 0801   doxycycline (VIBRA-TABS) tablet 100 mg  100 mg Oral Q12H Floydene Flock, MD   100 mg at 04/15/23 0801   feeding supplement (ENSURE ENLIVE / ENSURE PLUS) liquid 237 mL  237 mL Oral BID BM Verner Chol, MD   237 mL at 04/15/23 1004   hydrOXYzine (ATARAX) tablet 25 mg  25 mg Oral TID PRN Motley-Mangrum, Jadeka A, PMHNP   25 mg at 04/15/23 0436   LORazepam (ATIVAN) injection 1 mg  1 mg Intramuscular Q4H PRN Remington, Amber E, NP       LORazepam (ATIVAN) injection 2 mg  2 mg Intramuscular TID PRN Motley-Mangrum, Jadeka A, PMHNP       LORazepam (ATIVAN) tablet 1 mg  1 mg Oral Q4H PRN Remington, Amber E, NP   1 mg at 04/15/23 0853   magnesium hydroxide (MILK OF MAGNESIA) suspension 30 mL  30 mL Oral Daily PRN Motley-Mangrum, Jadeka A, PMHNP       multivitamin with minerals tablet 1 tablet  1 tablet Oral Daily Verner Chol, MD   1 tablet at 04/15/23 0801   predniSONE (DELTASONE) tablet 30 mg  30 mg Oral Q breakfast Motley-Mangrum, Jadeka A, PMHNP   30 mg at 04/15/23 0801   PTA Medications: Medications Prior to  Admission  Medication Sig Dispense Refill Last Dose/Taking   busPIRone (BUSPAR) 10 MG tablet Take 1 tablet (10 mg total) by mouth 2 (two) times daily. 30 tablet 0 04/12/2023   dicyclomine (BENTYL) 10 MG capsule Take 1 capsule (10 mg total) by mouth 3 (three) times daily before meals. 90 capsule 0 Taking   escitalopram (LEXAPRO) 10 MG tablet Take 1 tablet (10 mg total) by mouth at bedtime. 30 tablet 0 04/13/2023   ketoconazole (NIZORAL) 2 % cream Apply 1 Application topically daily.   Taking   LORazepam (ATIVAN) 0.5 MG tablet Take 0.5 mg by mouth daily.   Taking   mirtazapine (REMERON) 30 MG tablet Take 1 tablet (30 mg total) by mouth at bedtime. 30 tablet 0 04/11/2023   predniSONE (DELTASONE) 20 MG tablet Take 1.5 tablets (30 mg total) by mouth daily with breakfast. 100 tablet 2 Past Month    Patient Stressors:    Patient Strengths:    Treatment Modalities: Medication Management, Group therapy, Case management,  1 to 1 session with clinician, Psychoeducation, Recreational therapy.   Physician Treatment Plan for Primary Diagnosis: MDD (major depressive disorder), recurrent severe, without psychosis ([name]) Long Term Goal(s):     Short Term Goals:    Medication Management: Evaluate patient's response, side effects, and tolerance of medication regimen.  Therapeutic Interventions: 1 to 1 sessions, Unit  Group sessions and Medication administration.  Evaluation of Outcomes: Progressing  Physician Treatment Plan for Secondary Diagnosis: Principal Problem:   MDD (major depressive disorder), recurrent severe, without psychosis ([name]) Active Problems:   GAD (generalized anxiety disorder)   Tremor  Long Term Goal(s):     Short Term Goals:       Medication Management: Evaluate patient's response, side effects, and tolerance of medication regimen.  Therapeutic Interventions: 1 to 1 sessions, Unit Group sessions and Medication administration.  Evaluation of Outcomes: Progressing   RN  Treatment Plan for Primary Diagnosis: MDD (major depressive disorder), recurrent severe, without psychosis ([name]) Long Term Goal(s): Knowledge of disease and therapeutic regimen to maintain health will improve  Short Term Goals: Ability to remain free from injury will improve, Ability to verbalize frustration and anger appropriately will improve, Ability to demonstrate self-control, Ability to participate in decision making will improve, Ability to verbalize feelings will improve, Ability to disclose and discuss suicidal ideas, Ability to identify and develop effective coping behaviors will improve, and Compliance with prescribed medications will improve  Medication Management: RN will administer medications as ordered by provider, will assess and evaluate patient's response and provide education to patient for prescribed medication. RN will report any adverse and/or side effects to prescribing provider.  Therapeutic Interventions: 1 on 1 counseling sessions, Psychoeducation, Medication administration, Evaluate responses to treatment, Monitor vital signs and CBGs as ordered, Perform/monitor CIWA, COWS, AIMS and Fall Risk screenings as ordered, Perform wound care treatments as ordered.  Evaluation of Outcomes: Progressing   LCSW Treatment Plan for Primary Diagnosis: MDD (major depressive disorder), recurrent severe, without psychosis ([name]) Long Term Goal(s): Safe transition to appropriate next level of care at discharge, Engage patient in therapeutic group addressing interpersonal concerns.  Short Term Goals: Engage patient in aftercare planning with referrals and resources, Increase social support, Increase ability to appropriately verbalize feelings, Increase emotional regulation, Facilitate acceptance of mental health diagnosis and concerns, Facilitate patient progression through stages of change regarding substance use diagnoses and concerns, Identify triggers associated with mental health/substance  abuse issues, and Increase skills for wellness and recovery  Therapeutic Interventions: Assess for all discharge needs, 1 to 1 time with Social worker, Explore available resources and support systems, Assess for adequacy in community support network, Educate family and significant other(s) on suicide prevention, Complete Psychosocial Assessment, Interpersonal group therapy.  Evaluation of Outcomes: Progressing   Progress in Treatment: Attending groups: No. Participating in groups: No. Taking medication as prescribed: Yes. Toleration medication: Yes. Family/Significant other contact made: No, will contact:  CSW will contact if given permission  Patient understands diagnosis: Yes. Discussing patient identified problems/goals with staff: Yes. Medical problems stabilized or resolved: No. Denies suicidal/homicidal ideation: Yes. Issues/concerns per patient self-inventory: No. Other: None   New problem(s) identified: No, Describe:  None identified   New Short Term/Long Term Goal(s): elimination of symptoms of psychosis, medication management for mood stabilization; elimination of SI thoughts; development of comprehensive mental wellness plan.   Patient Goals:  " I want to get control of my anxiety"  Discharge Plan or Barriers: CSW to assist with appropriate discharge planning   Reason for Continuation of Hospitalization: Anxiety Depression Medication stabilization  Estimated Length of Stay: 1 to 7 days   Last 3 [place] Suicide Severity Risk Score: Flowsheet Row Admission (Current) from 04/13/2023 in [facility] BEHAVIORAL MEDICINE Most recent reading at 04/13/2023  7:45 PM ED from 04/13/2023 in [facility] Emergency Department at [facility] Most recent reading at 04/13/2023  4:16 AM ED from 04/11/2023 in [facility] Most recent reading at 04/11/2023  1:11 PM  C-SSRS RISK CATEGORY High Risk High Risk No Risk       Last PHQ 2/9 Scores:     06/06/2020   11:06 AM 03/06/2020    2:49 PM 08/28/2019    1:04 PM  Depression screen PHQ 2/9  Decreased Interest 0 0 0  Down, Depressed, Hopeless 0 1 1  PHQ - 2 Score 0 1 1  Altered sleeping 1 2 3   Tired, decreased energy 1 2 3   Change in appetite 1 1 1   Feeling bad or failure about yourself  0 0 0  Trouble concentrating 1 2 3   Moving slowly or fidgety/restless 1 0 0  Suicidal thoughts 0 0 0  PHQ-9 Score 5 8 11   Difficult doing work/chores   Somewhat difficult    Scribe for Treatment Team: Elza Rafter, [place] 04/15/2023 11:08 AM

## 2023-04-15 NOTE — Progress Notes (Signed)
Current 1:1 sitter for patient. Asleep, tremors noted while sleeping.

## 2023-04-15 NOTE — BHH Suicide Risk Assessment (Signed)
 Suicide Risk Assessment  Admission Assessment    [facility] Admission Suicide Risk Assessment   Nursing information obtained from:    Demographic factors:  Caucasian, Unemployed Current Mental Status:  Suicidal ideation indicated by patient Loss Factors:  Decline in physical health Historical Factors:  Prior suicide attempts Risk Reduction Factors:  Positive social support  Total Time spent with patient: 2 hours Principal Problem: MDD (major depressive disorder), recurrent severe, without psychosis ([name]) Diagnosis:  Principal Problem:   MDD (major depressive disorder), recurrent severe, without psychosis ([name]) Active Problems:   GAD (generalized anxiety disorder)   Tremor  Subjective Data:  [name] is a 58 year old Caucasian male with a history of MDD, anxiety, Crohn's disease, and chronic mesenteric ischemia who presented to [facility] emergency department on 04/13/23 at 0404 for suicide attempt by ingestion of 28 tablets of 10mg  Lexapro at 0200. Per ED documentation, patient had some mild symptoms of serotonin syndrome on arrival, including acute agitation and clonus, with symptomatic improvement with benzodiazepines. Did have a return of symptoms requiring additional dose of lorazepam. Poison control advised the total amount of ingested Lexapro likely is not enough to cause serotonin syndrome by itself and they recommended 8-hour cardiac monitoring. Qtc 498.   Patient was recently hospitalized at [facility] 03/28/23-04/04/23 for SI. He presented to the [facility] ED on 04/04/23 for dehydration. He presented to [facility] on 04/11/23 requesting medications for anxiety and panic at which time Lexapro was increased from 5mg  to 10mg , Remeron was continued, and trial of buspirone was initiated.   Upon evaluation today, patient continues to endorse suicidal ideation and feelings of helplessness.   Continued Clinical Symptoms:    The "Alcohol Use Disorders Identification Test", Guidelines for Use in  Primary Care, Second Edition.  World Science writer [facility]). Score between 0-7:  no or low risk or alcohol related problems. Score between 8-15:  moderate risk of alcohol related problems. Score between 16-19:  high risk of alcohol related problems. Score 20 or above:  warrants further diagnostic evaluation for alcohol dependence and treatment.   CLINICAL FACTORS:   Severe Anxiety and/or Agitation Panic Attacks Depression:   Hopelessness Severe More than one psychiatric diagnosis Previous Psychiatric Diagnoses and Treatments Medical Diagnoses and Treatments/Surgeries   Musculoskeletal: Strength & Muscle Tone: decreased Gait & Station: unsteady Patient leans: N/A  Psychiatric Specialty Exam:   Presentation  General Appearance:  Disheveled   Eye Contact: Fair   Speech: Clear and Coherent; Normal Rate   Speech Volume: Normal   Handedness: Right     Mood and Affect  Mood: Anxious; Depressed   Affect: Flat     Thought Process  Thought Processes: Coherent   Duration of Psychotic Symptoms:N/A Past Diagnosis of Schizophrenia or Psychoactive disorder: Denies Descriptions of Associations:Intact   Orientation:Full (Time, Place and Person)   Thought Content:Logical   Hallucinations:Denies Ideas of Reference:None   Suicidal Thoughts: Yes Homicidal Thoughts: No   Sensorium  Memory: Immediate Good; Recent Good; Remote Good   Judgment: Poor   Insight: Poor     Executive Functions  Concentration: Poor   Attention Span: Poor   Recall: Fair   Fund of Knowledge: Good   Language: Good     Psychomotor Activity  Psychomotor Activity: agitation   Assets  Assets: Communication Skills; Desire for Improvement; Financial Resources/Insurance; Leisure Time; Housing     Sleep  Sleep:Poor   Physical Exam: Physical Exam see h&p  ROS see h&p Blood pressure (!) 149/94, pulse 98, temperature 98.4 F (36.9  C), temperature source Oral, resp.  rate 20, height 6\' 1"  (1.854 m), weight 60.6 kg, SpO2 100%. Body mass index is 17.61 kg/m.    COGNITIVE FEATURES THAT CONTRIBUTE TO RISK:  Polarized thinking    SUICIDE RISK:   Severe:  Frequent, intense, and enduring suicidal ideation, specific plan, no subjective intent, but some objective markers of intent (i.e., choice of lethal method), the method is accessible, some limited preparatory behavior, evidence of impaired self-control, severe dysphoria/symptomatology, multiple risk factors present, and few if any protective factors, particularly a lack of social support.  PLAN OF CARE:  crisis stabilization, psychiatric evaluation, therapeutic milieu, group participation, medication management for MDD and GAD, and development of an individualized safety plan.   I certify that inpatient services furnished can reasonably be expected to improve the patient's condition.   Roman Dubuc E Criss Bartles, NP 04/15/2023, 10:22 AM

## 2023-04-15 NOTE — Plan of Care (Addendum)
D: Pt alert and oriented. Pt report experiencing anxiety/depression at this time. Pt denies experiencing any pain at this time. Pt denies experiencing any SI/HI, or AVH at this time.   A: Scheduled medications administered to pt, per MD orders. Support and encouragement provided. Frequent verbal contact made. Routine safety checks conducted q15 minutes. Pt is on a tele sitter while in his room. Continuous 1:1 discontinued this afternoon. Pt is satisfied with this change.    R: No adverse drug reactions noted. Pt verbally contracts for safety at this time. Pt compliant with medications and treatment plan. Pt interacts well but minimally with others on the unit. Pt remains safe at this time. Plan of care ongoing.  Pt has been refusing ensures. Pt states they cause him diarrhea. MD notified and aware, ensures are discontinued.   Problem: Education: Goal: Emotional status will improve Outcome: Not Progressing   Problem: Activity: Goal: Interest or engagement in activities will improve Outcome: Progressing

## 2023-04-15 NOTE — Progress Notes (Signed)
   04/15/23 2100  Psych Admission Type (Psych Patients Only)  Admission Status Involuntary  Psychosocial Assessment  Patient Complaints Depression;Anxiety  Eye Contact Brief  Facial Expression Flat  Affect Sad  Speech Soft  Interaction Minimal  Motor Activity Tremors;Slow  Appearance/Hygiene Disheveled  Behavior Characteristics Cooperative  Mood Depressed;Sad  Thought Process  Coherency WDL  Content Blaming self  Delusions None reported or observed  Perception Derealization  Hallucination None reported or observed  Judgment Impaired  Confusion WDL  Danger to Self  Current suicidal ideation? Denies  Agreement Not to Harm Self Yes  Description of Agreement verbal  Danger to Others  Danger to Others None reported or observed

## 2023-04-15 NOTE — Group Note (Signed)
Recreation Therapy Group Note   Group Topic:Communication  Group Date: 04/15/2023 Start Time: 1400 End Time: 1445 Facilitators: Rosina Lowenstein, LRT, CTRS Location:  Dayroom  Group Description: Emotional Check in. Patient sat and talked with LRT about how they are doing and whatever else is on their mind. LRT provided active listening, reassurance and encouragement. Pts were given the opportunity to listen to music or color mandalas while they talk.     Goal Area(s) Addressed:  Patient will engage in conversation with LRT.  Patient will communicate their wants, needs, or questions.   Patient will practice a new coping skill of "talking to someone".    Affect/Mood: N/A   Participation Level: Did not attend    Clinical Observations/Individualized Feedback: Patient did not attend group.   Plan: Continue to engage patient in RT group sessions 2-3x/week.   Rosina Lowenstein, LRT, CTRS 04/15/2023 4:21 PM

## 2023-04-15 NOTE — Plan of Care (Signed)
Problem: Education: Goal: Emotional status will improve Outcome: Progressing Goal: Mental status will improve Outcome: Progressing

## 2023-04-15 NOTE — BH Assessment (Signed)
  Patient remains safe on unit with sitter at the bedside. Q15 safety checks are being performed.

## 2023-04-15 NOTE — Progress Notes (Signed)
OT Cancellation Note  Patient Details Name: [name] MRN: [id number] DOB: [birth year]   Cancelled Treatment:    Reason Eval/Treat Not Completed: Other (comment). Upon attempt, pt in day room, dinner arrived early to the unit. MD also arrived to assess pt. Will re-attempt OT evaluation next date as appropriate.   Arman Filter., MPH, MS, OTR/L ascom [phone] 04/15/23, 4:07 PM

## 2023-04-15 NOTE — Progress Notes (Signed)
 Neurology brief note  58 year old Caucasian male with a history of MDD, anxiety, Crohn's disease, and chronic mesenteric ischemia who presented to [facility] emergency department on 04/13/23 at 0404 for suicide attempt by ingestion of 28 tablets of 10mg  Lexapro at 0200. Per ED documentation, patient had some mild symptoms of serotonin syndrome on arrival, including acute agitation and clonus, with symptomatic improvement with benzodiazepines. Did have a return of symptoms requiring additional dose of lorazepam. Poison control advised the total amount of ingested Lexapro likely is not enough to cause serotonin syndrome by itself and they recommended 8-hour cardiac monitoring. Qtc 498.   Patient's agitation and tremors resolved after benzodiazepines. Vitals stable since admission with no hyperthermia.  When neurology attempted formal consult patient was in the commons room and had just started his mea. He requested that consult incl exam be deferred until tmrw. Neurology will f/u at that time.  Bing Neighbors, MD [facility] [phone]  If 7pm- 7am, please page neurology on call as listed in AMION.

## 2023-04-16 MED ORDER — ONDANSETRON 4 MG PO TBDP: 4.0000 mg | ORAL_TABLET | Freq: Three times a day (TID) | ORAL | Status: DC | PRN

## 2023-04-16 MED ORDER — ONDANSETRON HCL 4 MG/2ML IJ SOLN
4.0000 mg | Freq: Four times a day (QID) | INTRAMUSCULAR | Status: DC | PRN
  Administered 2023-04-16: 4 mg via INTRAVENOUS
  Filled 2023-04-16 (×2): qty 2

## 2023-04-16 NOTE — Progress Notes (Signed)
 Progress Note   Patient: [name]:[id number] DOB: 1965/09/20 DOA: 04/13/2023     3 DOS: the patient was seen and examined on 04/16/2023      Brief hospital course:   HPI "[name] is a 58 y.o. male with past medical history of Crohn's disease, major depressive disorder, substance abuse presenting to behavioral health after intentional Lexapro overdose.  We have been consulted for tremor.  Patient noted to have been recently admitted for Lexapro overdose.  Patient intentionally took roughly 28 Lexapro's.  There was some initial concern for serotonin syndrome with symptomatic improvement after benzodiazepine use per report.  Per psychiatry nurse practitioner, patient had worsening tremor as well as malaise over the course of the day.  On my evaluation, patient is alert at the bedside though he reports worsening fatigue especially with associated group activities.  Patient feels that social activities are overly stimulating.  Has had mild tremor as well as worsening ataxia.  He is usually able to walk a few steps without assistance now has significant weakness with standing."   Assessment and Plan: Tremor of unclear etiology likely secondary to psychiatric component Positive generalized tremor and mild ataxia in the setting of recent Lexapro overdose Does not appear to be in acute serotonin syndrome Does appear to be psychiatric component as patient reports worsening symptoms associated with group activities and large crowds Also patient reports being able to control this tremor on 1 side of his body. CT scan of the brain did not show acute intracranial pathology EKG did not show significant change Patient received IV fluid bolus Neurologist input appreciated Pharmacy to consult for medication management and potential overlapping serotonergic compounds  We will continue to monitor closely  Hospitalist service will sign off at this time please call back if additional help  is needed.    Right maxillary sinusitis Continue current antibiotic therapy to complete 5 to 7 days course   MDD (major depressive disorder), recurrent severe, without psychosis ([name]) Management per primary team      Subjective:  Patient seen and examined at bedside He tells me his tremor is improving Denies  vomiting abdominal pain or chest pain He still feels depressed and does not feel life is worth living.       Physical Exam:    Pupils: Pupils are equal, round, and reactive to light.  Cardiovascular:     Rate and Rhythm: Regular rhythm.  Pulmonary:     Effort: Pulmonary effort is normal.  Abdominal:     General: Bowel sounds are normal.  Musculoskeletal:        General: Normal range of motion.  Skin:    General: Skin is warm and dry.  Neurological:     General: No focal deficit present.  Psychiatric:        Mood and Affect: Mood normal.  Vitals:   04/14/23 1415 04/15/23 0555 04/15/23 1911 04/16/23 0832  BP: (!) 149/94 (!) 153/98 129/81 138/77  Pulse: 98 73 75 84  Resp: 20 16 18    Temp: 98.4 F (36.9 C) (!) 97.3 F (36.3 C) 98.2 F (36.8 C) 97.7 F (36.5 C)  TempSrc: Oral     SpO2: 100%  99% 97%  Weight:      Height:        Data Reviewed:    Latest Ref Rng & Units 04/14/2023    3:55 PM 04/13/2023    4:27 AM 04/03/2023    6:23 PM  BMP  Glucose 70 -  99 mg/dL 629  75  528   BUN 6 - 20 mg/dL 29  14  19    Creatinine 0.61 - 1.24 mg/dL 4.13  2.44  0.10   Sodium 135 - 145 mmol/L 138  134  131   Potassium 3.5 - 5.1 mmol/L 4.1  3.1  4.1   Chloride 98 - 111 mmol/L 103  99  95   CO2 22 - 32 mmol/L 26  23  27    Calcium 8.9 - 10.3 mg/dL 8.3  8.1  8.3        Latest Ref Rng & Units 04/14/2023    3:55 PM 04/13/2023    4:27 AM 04/03/2023    6:23 PM  CBC  WBC 4.0 - 10.5 K/uL 12.1  12.4  9.0   Hemoglobin 13.0 - 17.0 g/dL 27.2  53.6  64.4   Hematocrit 39.0 - 52.0 % 44.4  46.4  47.4   Platelets 150 - 400 K/uL 219  200  166     Hospitalist service will sign off at  this time please call back if additional help is needed.  Author: Loyce Dys, MD 04/16/2023 4:02 PM  For on call review [email or web address].

## 2023-04-16 NOTE — Plan of Care (Signed)
 D: Pt alert and oriented. Pt reports experiencing anxiety/depression at this time. Pt denies experiencing any pain at this time. Pt denies experiencing any SI/HI, or AVH at this time.   A: Scheduled medications administered to pt, per MD orders. Support and encouragement provided. Frequent verbal contact made. Routine safety checks conducted q15 minutes. Pt is on a tele sitter for continuous monitoring for fall safety.  R: No adverse drug reactions noted. Pt verbally contracts for safety at this time. Pt compliant with medications and treatment plan. Pt interacts minimally with others on the unit. Pt remains safe at this time. Plan of care ongoing.  Problem: Education: Goal: Emotional status will improve Outcome: Not Progressing   Problem: Activity: Goal: Interest or engagement in activities will improve Outcome: Progressing

## 2023-04-16 NOTE — Progress Notes (Signed)
 [facility] MD Progress Note  04/16/2023 11:03 AM DARE SANGER  MRN:  [id number]  [name] is a 58 year old Caucasian male with a history of MDD, anxiety, Crohn's disease, and chronic mesenteric ischemia who presented to [facility] emergency department on 04/13/23 at 0404 for suicide attempt by ingestion of 28 tablets of 10mg  Lexapro at 0200. Per ED documentation, patient had some mild symptoms of serotonin syndrome on arrival, including acute agitation and clonus, with symptomatic improvement with benzodiazepines. Did have a return of symptoms requiring additional dose of lorazepam. Poison control advised the total amount of ingested Lexapro likely is not enough to cause serotonin syndrome by itself and they recommended 8-hour cardiac monitoring. Qtc 498.  Subjective:  Chart reviewed, case discussed in multidisciplinary meeting, patient seen during rounds.  Patient is noted to be resting in bed.  He is awake, alert, oriented x 4.  Earlier today morning patient reported nausea and chest pain.  He received Zofran IV.  When the provider went into talk to the patient he denied having any chest pain.  His tremors are also coming down.  He is encouraged to stay hydrated.  He reports feeling depressed and hopeless.  He reports that he is going through financial problems and has no family support.  He made passive suicidal statements in the morning tentative plan.  He denies HI.  He denies auditory/visual hallucinations.  He is aware that he is off of his psychotropic medications at this time given the serotonin toxicity due to the overdose on Lexapro.  Patient reports feeling anxious.  Sleep: Fair  Appetite:  Fair  Past Psychiatric History: see h&P Family History:  Family History  Problem Relation Age of Onset   Heart disease Father    Thyroid cancer Father    Allergies Father    Clotting disorder Father    Breast cancer Mother    Stomach cancer Mother    Colon cancer Neg Hx    Esophageal  cancer Neg Hx    Rectal cancer Neg Hx    Social History:  Social History   Substance and Sexual Activity  Alcohol Use No   Alcohol/week: 0.0 standard drinks of alcohol     Social History   Substance and Sexual Activity  Drug Use Not Currently   Types: Cocaine, Marijuana   Comment: QUIT USING DRUGS IN 1997    Social History   Socioeconomic History   Marital status: Single    Spouse name: Not on file   Number of children: 1   Years of education: Not on file   Highest education level: Not on file  Occupational History   Occupation: Disability   Tobacco Use   Smoking status: Former    Current packs/day: 0.00    Average packs/day: 1 pack/day for 15.0 years (15.0 ttl pk-yrs)    Types: Cigarettes    Start date: 03/28/1982    Quit date: 03/28/1997    Years since quitting: 26.0   Smokeless tobacco: Never  Vaping Use   Vaping status: Never Used  Substance and Sexual Activity   Alcohol use: No    Alcohol/week: 0.0 standard drinks of alcohol   Drug use: Not Currently    Types: Cocaine, Marijuana    Comment: QUIT USING DRUGS IN 1997   Sexual activity: Not Currently  Other Topics Concern   Not on file  Social History Narrative   Single, history of substance abuse in recovery   Previously occupied on medical disability   1 child  Daughter Wellsite geologist, lives and works in [place]   Elderly parents involved and he helps them   1 brother   Social Drivers of Corporate investment banker Strain: Low Risk  (08/28/2019)   Overall Financial Resource Strain (CARDIA)    Difficulty of Paying Living Expenses: Not hard at all  Food Insecurity: Food Insecurity Present (04/13/2023)   Hunger Vital Sign    Worried About Running Out of Food in the Last Year: Never true    Ran Out of Food in the Last Year: Sometimes true  Transportation Needs: No Transportation Needs (04/13/2023)   PRAPARE - Administrator, Civil Service (Medical): No    Lack of Transportation (Non-Medical): No   Physical Activity: Insufficiently Active (11/08/2019)   Exercise Vital Sign    Days of Exercise per Week: 3 days    Minutes of Exercise per Session: 30 min  Stress: Stress Concern Present (08/28/2019)   [employer] of Occupational Health - Occupational Stress Questionnaire    Feeling of Stress : Very much  Social Connections: Socially Isolated (03/28/2023)   Social Connection and Isolation Panel [NHANES]    Frequency of Communication with Friends and Family: Three times a week    Frequency of Social Gatherings with Friends and Family: Twice a week    Attends Religious Services: Never    Database administrator or Organizations: No    Attends Engineer, structural: Never    Marital Status: Divorced   Past Medical History:  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 06/15/2018   Cancer of skin of neck    Cataract    Chronic mesenteric ischemia ([name])    Chronic pain syndrome 07/09/2016   Crohn's disease of small and large intestines ([name])    followed by dr Baldo Ash gessner   Dairy product intolerance    Diarrhea, functional    Family history of adverse reaction to anesthesia    Grover's disease    transient acantholytic dermatosis   History of alcohol abuse    History of basal cell carcinoma excision    2013 left leg   History of Clostridium difficile    10/ 2014   History of multiple concussions    x6   last one Jan 2017 per pt--  no residual   History of substance abuse ([name])    quit 1997 per pt   History of suicide attempt    05-18-2012  overdose /  failure to thrive   Iron deficiency anemia due to chronic blood loss    Major depression, recurrent, chronic ([name])    Osteopenia    Personal history of adenomatous colonic polyps 12/2010,  03/2012   12/2010 - 8 mm serrated adenoma of rectum   Portal vein thrombosis 03/21/2015   right   Primary sclerosing cholangitis    ? 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   Substance abuse ([name]) 1997   Alcohol   Vitamin A deficiency 05/23/2018   Vitamin B6 deficiency 07/27/2018   Vitamin C deficiency 05/23/2018    Past Surgical History:  Procedure Laterality Date   ABDOMINAL AORTAGRAM N/A  03/29/2012   Procedure: ABDOMINAL AORTAGRAM;  Surgeon: Nada Libman, MD;  Location: [facility] CATH LAB;  Service: Cardiovascular;  Laterality: N/A;   ADENOIDECTOMY  age 51   BIOPSY  05/31/2018   Procedure: BIOPSY;  Surgeon: Rachael Fee, MD;  Location: [facility] ENDOSCOPY;  Service: Endoscopy;;   CATARACT EXTRACTION W/ INTRAOCULAR LENS  IMPLANT, BILATERAL  2009   COLONOSCOPY  2001, 05/02/2003, 01/28/11   2012: Right colon Crohn's, rectal polyp   COLONOSCOPY  03/31/2012   Procedure: COLONOSCOPY;  Surgeon: Beverley Fiedler, MD;  Location: [facility] ENDOSCOPY;  Service: Gastroenterology;  Laterality: N/A;   COLONOSCOPY WITH PROPOFOL N/A 05/31/2018   Procedure: COLONOSCOPY WITH PROPOFOL;  Surgeon: Rachael Fee, MD;  Location: [facility] ENDOSCOPY;  Service: Endoscopy;  Laterality: N/A;   ESOPHAGOGASTRODUODENOSCOPY  01/28/2011   Normal   FOOT SURGERY Right age 40   MOHS SURGERY Left 11/2013   left ankle parakerotosis    PERCUTANEOUS LIVER BIOPSY  2007 and 2008   PILONIDAL CYST EXCISION  age 75   PLACEMENT OF SETON N/A 11/06/2015   Procedure: PLACEMENT OF SETON;  Surgeon: Romie Levee, MD;  Location[facility];  Service: General;  Laterality: N/A;   PLACEMENT OF SETON  07/2018   at [facility]   RECTAL EXAM UNDER ANESTHESIA N/A 01/18/2019   Procedure: ANAL EXAM UNDER ANESTHESIA,  INCISION AND DRAINAGE, SETON PLACEMENT;  Surgeon: Romie Levee, MD;  Location: [facility];  Service: General;  Laterality: N/A;   UPPER  GASTROINTESTINAL ENDOSCOPY      Current Medications: Current Facility-Administered Medications  Medication Dose Route Frequency Provider Last Rate Last Admin   acetaminophen (TYLENOL) tablet 650 mg  650 mg Oral Q6H PRN Motley-Mangrum, Jadeka A, PMHNP       alum & mag hydroxide-simeth (MAALOX/MYLANTA) 200-200-20 MG/5ML suspension 30 mL  30 mL Oral Q4H PRN Motley-Mangrum, Jadeka A, PMHNP       dicyclomine (BENTYL) capsule 10 mg  10 mg Oral TID AC Motley-Mangrum, Jadeka A, PMHNP   10 mg at 04/16/23 0940   doxycycline (VIBRA-TABS) tablet 100 mg  100 mg Oral Q12H Floydene Flock, MD   100 mg at 04/16/23 0940   hydrOXYzine (ATARAX) tablet 25 mg  25 mg Oral TID PRN Motley-Mangrum, Ezra Sites, PMHNP   25 mg at 04/15/23 0436   LORazepam (ATIVAN) injection 1 mg  1 mg Intramuscular Q4H PRN Remington, Amber E, NP       LORazepam (ATIVAN) injection 2 mg  2 mg Intramuscular TID PRN Motley-Mangrum, Jadeka A, PMHNP       LORazepam (ATIVAN) tablet 1 mg  1 mg Oral Q4H PRN Remington, Amber E, NP   1 mg at 04/16/23 1610   magnesium hydroxide (MILK OF MAGNESIA) suspension 30 mL  30 mL Oral Daily PRN Motley-Mangrum, Jadeka A, PMHNP       multivitamin with minerals tablet 1 tablet  1 tablet Oral Daily Verner Chol, MD   1 tablet at 04/16/23 0940   ondansetron (ZOFRAN) injection 4 mg  4 mg Intravenous Q6H PRN Verner Chol, MD   4 mg at 04/16/23 0905   ondansetron (ZOFRAN-ODT) disintegrating tablet 4 mg  4 mg Oral Q8H PRN Verner Chol, MD       predniSONE (DELTASONE) tablet 30 mg  30 mg Oral Q breakfast Motley-Mangrum, Jadeka A, PMHNP   30 mg at 04/16/23 9604    Lab Results:  Results for orders placed or performed during the hospital encounter of 04/13/23 (from  the past 48 hours)  Glucose, capillary     Status: Abnormal   Collection Time: 04/14/23  1:55 PM  Result Value Ref Range   Glucose-Capillary 120 (H) 70 - 99 mg/dL    Comment: Glucose reference range applies only to samples taken after fasting for at  least 8 hours.  CBC with Differential/Platelet     Status: Abnormal   Collection Time: 04/14/23  3:55 PM  Result Value Ref Range   WBC 12.1 (H) 4.0 - 10.5 K/uL   RBC 4.74 4.22 - 5.81 MIL/uL   Hemoglobin 15.1 13.0 - 17.0 g/dL   HCT 96.0 45.4 - 09.8 %   MCV 93.7 80.0 - 100.0 fL   MCH 31.9 26.0 - 34.0 pg   MCHC 34.0 30.0 - 36.0 g/dL   RDW 11.9 14.7 - 82.9 %   Platelets 219 150 - 400 K/uL   nRBC 0.0 0.0 - 0.2 %   Neutrophils Relative % 86 %   Neutro Abs 10.5 (H) 1.7 - 7.7 K/uL   Lymphocytes Relative 3 %   Lymphs Abs 0.4 (L) 0.7 - 4.0 K/uL   Monocytes Relative 3 %   Monocytes Absolute 0.3 0.1 - 1.0 K/uL   Eosinophils Relative 0 %   Eosinophils Absolute 0.0 0.0 - 0.5 K/uL   Basophils Relative 1 %   Basophils Absolute 0.1 0.0 - 0.1 K/uL   WBC Morphology Mild Left Shift (1-5% metas, occ myelo)    RBC Morphology MORPHOLOGY UNREMARKABLE    Smear Review Normal platelet morphology    Immature Granulocytes 7 %   Abs Immature Granulocytes 0.79 (H) 0.00 - 0.07 K/uL   Smudge Cells PRESENT     Comment: Performed at [facility], [address] [place], [place] [address]  Comprehensive metabolic panel     Status: Abnormal   Collection Time: 04/14/23  3:55 PM  Result Value Ref Range   Sodium 138 135 - 145 mmol/L   Potassium 4.1 3.5 - 5.1 mmol/L   Chloride 103 98 - 111 mmol/L   CO2 26 22 - 32 mmol/L   Glucose, Bld 100 (H) 70 - 99 mg/dL    Comment: Glucose reference range applies only to samples taken after fasting for at least 8 hours.   BUN 29 (H) 6 - 20 mg/dL   Creatinine, Ser 0.86 0.61 - 1.24 mg/dL   Calcium 8.3 (L) 8.9 - 10.3 mg/dL   Total Protein 5.5 (L) 6.5 - 8.1 g/dL   Albumin 2.7 (L) 3.5 - 5.0 g/dL   AST 33 15 - 41 U/L   ALT 41 0 - 44 U/L   Alkaline Phosphatase 108 38 - 126 U/L   Total Bilirubin 0.9 0.0 - 1.2 mg/dL   GFR, Estimated >57 >84 mL/min    Comment: (NOTE) Calculated using the CKD-EPI Creatinine Equation (2021)    Anion gap 9 5 - 15    Comment: Performed  at [facility], [address] [place], [place] [address]  CK     Status: Abnormal   Collection Time: 04/14/23  3:55 PM  Result Value Ref Range   Total CK 19 (L) 49 - 397 U/L    Comment: Performed at [facility], [address] [place], [place] [address]  Troponin I (High Sensitivity)     Status: None   Collection Time: 04/14/23  3:55 PM  Result Value Ref Range   Troponin I (High Sensitivity) 4 <18 ng/L    Comment: (NOTE) Elevated high sensitivity  troponin I (hsTnI) values and significant  changes across serial measurements may suggest ACS but many other  chronic and acute conditions are known to elevate hsTnI results.  Refer to the "Links" section for chest pain algorithms and additional  guidance. Performed at [facility], [address] [place], [place] [address]   Magnesium     Status: None   Collection Time: 04/15/23  7:55 AM  Result Value Ref Range   Magnesium 2.3 1.7 - 2.4 mg/dL    Comment: Performed at [facility], [address] [place], [place] [address]   *Note: Due to a large number of results and/or encounters for the requested time period, some results have not been displayed. A complete set of results can be found in Results Review.    Blood Alcohol level:  Lab Results  Component Value Date   ETH <10 04/13/2023   ETH <10 03/28/2023    Metabolic Disorder Labs: Lab Results  Component Value Date   HGBA1C 4.8 04/03/2023   MPG 91.06 04/03/2023   No results found for: "PROLACTIN" Lab Results  Component Value Date   CHOL 209 (H) 04/03/2023   TRIG 75 04/03/2023   HDL 65 04/03/2023   CHOLHDL 3.2 04/03/2023   VLDL 15 04/03/2023   LDLCALC 129 (H) 04/03/2023    Physical Findings: AIMS:  , ,  ,  ,    CIWA:    COWS:      Psychiatric Specialty Exam:  Presentation  General Appearance:  Appropriate for Environment; Casual  Eye Contact: Minimal  Speech: Clear and Coherent  Speech  Volume: Decreased    Mood and Affect  Mood: Hopeless; Depressed; Anxious  Affect: Flat; Depressed   Thought Process  Thought Processes: Coherent  Descriptions of Associations:Intact  Orientation:Full (Time, Place and Person)  Thought Content:Logical  Hallucinations:Hallucinations: None  Ideas of Reference:None  Suicidal Thoughts:Suicidal Thoughts: Yes, Passive SI Passive Intent and/or Plan: Without Plan; Without Intent  Homicidal Thoughts:Homicidal Thoughts: No   Sensorium  Memory: Immediate Fair; Recent Fair; Remote Poor  Judgment: Impaired  Insight: Shallow   Executive Functions  Concentration: Poor  Attention Span: Poor  Recall: [employer] of Knowledge: Fair  Language: Fair   Psychomotor Activity  Psychomotor Activity: Psychomotor Activity: Normal  Musculoskeletal: Strength & Muscle Tone: decreased Gait & Station: unsteady Assets  Assets: Manufacturing systems engineer; Desire for Improvement; Housing    Physical Exam: Physical Exam Vitals and nursing note reviewed.  HENT:     Head: Normocephalic.     Nose: Nose normal.     Mouth/Throat:     Mouth: Mucous membranes are moist.  Eyes:     Pupils: Pupils are equal, round, and reactive to light.  Pulmonary:     Breath sounds: Normal breath sounds.  Abdominal:     General: Bowel sounds are normal.  Neurological:     General: No focal deficit present.     Mental Status: He is alert.    Review of Systems  Constitutional:  Positive for malaise/fatigue.  HENT: Negative.    Eyes: Negative.   Cardiovascular: Negative.   Gastrointestinal: Negative.   Skin: Negative.   Blood pressure 138/77, pulse 84, temperature 97.7 F (36.5 C), resp. rate 18, height 6\' 1"  (1.854 m), weight 60.6 kg, SpO2 97%. Body mass index is 17.61 kg/m.  Diagnosis: Principal Problem:   MDD (major depressive disorder), recurrent severe, without psychosis ([name]) Active Problems:   GAD (generalized anxiety  disorder)   Tremor   PLAN:  Safety and Monitoring:  -- Involuntary admission to inpatient psychiatric unit for safety, stabilization and treatment  -- Daily contact with patient to assess and evaluate symptoms and progress in treatment  -- Patient's case to be discussed in multi-disciplinary team meeting  -- Observation Level : q15 minute checks  -- Vital signs:  q12 hours  -- Precautions: suicide, elopement, and assault -- Encouraged patient to participate in unit milieu and in scheduled group therapies  2. Psychiatric Diagnoses and Treatment:    Hold serotonergic and QT prolonging medication  (Lexapro, Buspar, Remeron)                       -- Metabolic profile and EKG monitoring (BMI: 17.61; Lipid Panel: HbgA1c: QTc: 498)     3. Medical Issues Being Addressed:   Working out on serotonin syndrome  4. Discharge Planning:   -- Social work and case management to assist with discharge planning and identification of hospital follow-up needs prior to discharge  -- Estimated LOS: 3-4 days  Verner Chol, MD 04/16/2023, 11:03 AM

## 2023-04-16 NOTE — Group Note (Signed)
 LCSW Group Therapy Note  Group Date: 04/16/2023 Start Time: 1310 End Time: 1350   Type of Therapy and Topic:  Group Therapy: Positive Affirmations  Participation Level:  Did Not Attend   Description of Group:   This group addressed positive affirmation towards self and others.  Patients went around the room and identified two positive things about themselves and two positive things about a peer in the room.  Patients reflected on how it felt to share something positive with others, to identify positive things about themselves, and to hear positive things from others/ Patients were encouraged to have a daily reflection of positive characteristics or circumstances.   Therapeutic Goals: Patients will verbalize two of their positive qualities Patients will demonstrate empathy for others by stating two positive qualities about a peer in the group Patients will verbalize their feelings when voicing positive self affirmations and when voicing positive affirmations of others Patients will discuss the potential positive impact on their wellness/recovery of focusing on positive traits of self and others.  Summary of Patient Progress:  Patient did not attend    Azucena Kuba, LCSWA 04/16/2023  3:59 PM

## 2023-04-16 NOTE — Plan of Care (Signed)
 Neurology plan of care  Per primary team, sx c/f serotonin syndrome resolved after tx with benzos. They have no further questions for neurology at this time and will cancel the consult. Please reach out to neurology if any new neurologic concerns arise.  Bing Neighbors, MD [facility] [phone]  If 7pm- 7am, please page neurology on call as listed in AMION.
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
## 2023-04-17 DIAGNOSIS — F332 Major depressive disorder, recurrent severe without psychotic features: Secondary | ICD-10-CM | POA: Diagnosis not present

## 2023-04-17 MED ORDER — ENSURE ENLIVE PO LIQD
1.0000 | Freq: Three times a day (TID) | ORAL | Status: DC
  Administered 2023-04-17 – 2023-04-25 (×21): 237 mL via ORAL
  Administered 2023-04-26: 1 via ORAL
  Administered 2023-04-26 – 2023-05-20 (×65): 237 mL via ORAL

## 2023-04-17 MED ORDER — ENSURE ENLIVE PO LIQD
237.0000 mL | Freq: Two times a day (BID) | ORAL | Status: DC
  Administered 2023-04-17: 237 mL via ORAL

## 2023-04-17 NOTE — Progress Notes (Signed)
 Patient admitted to Behavioral Medicine on Apr 13, 2023 then transferred to [facility] on Feb 14. Patient intentionally ingested over 20 tabs of Lexapro thus developing symptoms of serotonin syndrome. Patient has a history of anxiety and panic attacks.  Today patient endorses anxiety and depression. Support and encouragement given. He denies SI/HI/AVH. He prefers to isolate to his room but will appear for meals.   He is on a telesitter for safety: missing eye glasses and hearing aid and unable to ambulate.  Ensure and double protein with meals implemented to promote muscle growth and repair and overall good health. Patient educated on the additions.  Q15 minute unit checks in place.

## 2023-04-17 NOTE — Progress Notes (Signed)
 [facility] MD Progress Note  04/17/2023 1:38 PM VALDIS BEVILL  MRN:  [id number]  [name] is a 58 year old Caucasian male with a history of MDD, anxiety, Crohn's disease, and chronic mesenteric ischemia who presented to [facility] emergency department on 04/13/23 at 0404 for suicide attempt by ingestion of 28 tablets of 10mg  Lexapro at 0200. Per ED documentation, patient had some mild symptoms of serotonin syndrome on arrival, including acute agitation and clonus, with symptomatic improvement with benzodiazepines. Did have a return of symptoms requiring additional dose of lorazepam. Poison control advised the total amount of ingested Lexapro likely is not enough to cause serotonin syndrome by itself and they recommended 8-hour cardiac monitoring. Qtc 498.  Subjective:  Chart reviewed, case discussed in multidisciplinary meeting, patient seen during rounds.  Today on interview patient is noted to be sleeping in bed.  He responded to verbal commands and engaged in interview.  He reports that he is tired of living and he does not know how to take care of himself.  When provider asked to express the concerns and stressors he reports that his mom used to take care of him most of his life and her his mom is 3 year old now and she cannot take care of him.  He reports that he cannot take care of his ADLs and even thinking about managing his daily activities and finances makes him depressed and suicidal.  He reports worsening anxiety and panic attacks.  He reports feeling tired most of the day with no motivation and feeling hopeless.  He continues to endorse SI with no active plan, denies HI/intent/plan.  He denies auditory/visual hallucinations. Sleep: Fair  Appetite:  Fair  Past Psychiatric History: see h&P Family History:  Family History  Problem Relation Age of Onset   Heart disease Father    Thyroid cancer Father    Allergies Father    Clotting disorder Father    Breast cancer Mother     Stomach cancer Mother    Colon cancer Neg Hx    Esophageal cancer Neg Hx    Rectal cancer Neg Hx    Social History:  Social History   Substance and Sexual Activity  Alcohol Use No   Alcohol/week: 0.0 standard drinks of alcohol     Social History   Substance and Sexual Activity  Drug Use Not Currently   Types: Cocaine, Marijuana   Comment: QUIT USING DRUGS IN 1997    Social History   Socioeconomic History   Marital status: Single    Spouse name: Not on file   Number of children: 1   Years of education: Not on file   Highest education level: Not on file  Occupational History   Occupation: Disability   Tobacco Use   Smoking status: Former    Current packs/day: 0.00    Average packs/day: 1 pack/day for 15.0 years (15.0 ttl pk-yrs)    Types: Cigarettes    Start date: 03/28/1982    Quit date: 03/28/1997    Years since quitting: 26.0   Smokeless tobacco: Never  Vaping Use   Vaping status: Never Used  Substance and Sexual Activity   Alcohol use: No    Alcohol/week: 0.0 standard drinks of alcohol   Drug use: Not Currently    Types: Cocaine, Marijuana    Comment: QUIT USING DRUGS IN 1997   Sexual activity: Not Currently  Other Topics Concern   Not on file  Social History Narrative   Single, history of substance abuse in  recovery   Previously occupied on medical disability   1 child  Daughter Wellsite geologist, lives and works in [place]   Elderly parents involved and he helps them   1 brother   Social Drivers of Corporate investment banker Strain: Low Risk  (08/28/2019)   Overall Financial Resource Strain (CARDIA)    Difficulty of Paying Living Expenses: Not hard at all  Food Insecurity: Food Insecurity Present (04/13/2023)   Hunger Vital Sign    Worried About Running Out of Food in the Last Year: Never true    Ran Out of Food in the Last Year: Sometimes true  Transportation Needs: No Transportation Needs (04/13/2023)   PRAPARE - Administrator, Civil Service  (Medical): No    Lack of Transportation (Non-Medical): No  Physical Activity: Insufficiently Active (11/08/2019)   Exercise Vital Sign    Days of Exercise per Week: 3 days    Minutes of Exercise per Session: 30 min  Stress: Stress Concern Present (08/28/2019)   [employer] of Occupational Health - Occupational Stress Questionnaire    Feeling of Stress : Very much  Social Connections: Socially Isolated (03/28/2023)   Social Connection and Isolation Panel [NHANES]    Frequency of Communication with Friends and Family: Three times a week    Frequency of Social Gatherings with Friends and Family: Twice a week    Attends Religious Services: Never    Database administrator or Organizations: No    Attends Engineer, structural: Never    Marital Status: Divorced   Past Medical History:  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 06/15/2018   Cancer of skin of neck    Cataract    Chronic mesenteric ischemia ([name])    Chronic pain syndrome 07/09/2016   Crohn's disease of small and large intestines ([name])    followed by dr Baldo Ash gessner   Dairy product intolerance    Diarrhea, functional    Family history of adverse reaction to anesthesia    Grover's disease    transient acantholytic dermatosis   History of alcohol abuse    History of basal cell carcinoma excision    2013 left leg   History of Clostridium difficile    10/ 2014   History of multiple concussions    x6   last one Jan 2017 per pt--  no residual   History of substance abuse ([name])    quit 1997 per pt   History of suicide attempt    05-18-2012  overdose /  failure to thrive   Iron deficiency anemia due to chronic blood loss    Major depression, recurrent, chronic ([name])    Osteopenia     Personal history of adenomatous colonic polyps 12/2010, 03/2012   12/2010 - 8 mm serrated adenoma of rectum   Portal vein thrombosis 03/21/2015   right   Primary sclerosing cholangitis    ? 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   Substance abuse ([name]) 1997   Alcohol   Vitamin A deficiency 05/23/2018   Vitamin B6 deficiency 07/27/2018   Vitamin C deficiency 05/23/2018  Past Surgical History:  Procedure Laterality Date   ABDOMINAL AORTAGRAM N/A 03/29/2012   Procedure: ABDOMINAL AORTAGRAM;  Surgeon: Nada Libman, MD;  Location: [facility] CATH LAB;  Service: Cardiovascular;  Laterality: N/A;   ADENOIDECTOMY  age 47   BIOPSY  05/31/2018   Procedure: BIOPSY;  Surgeon: Rachael Fee, MD;  Location: [facility] ENDOSCOPY;  Service: Endoscopy;;   CATARACT EXTRACTION W/ INTRAOCULAR LENS  IMPLANT, BILATERAL  2009   COLONOSCOPY  2001, 05/02/2003, 01/28/11   2012: Right colon Crohn's, rectal polyp   COLONOSCOPY  03/31/2012   Procedure: COLONOSCOPY;  Surgeon: Beverley Fiedler, MD;  Location: [facility] ENDOSCOPY;  Service: Gastroenterology;  Laterality: N/A;   COLONOSCOPY WITH PROPOFOL N/A 05/31/2018   Procedure: COLONOSCOPY WITH PROPOFOL;  Surgeon: Rachael Fee, MD;  Location: [facility] ENDOSCOPY;  Service: Endoscopy;  Laterality: N/A;   ESOPHAGOGASTRODUODENOSCOPY  01/28/2011   Normal   FOOT SURGERY Right age 74   MOHS SURGERY Left 11/2013   left ankle parakerotosis    PERCUTANEOUS LIVER BIOPSY  2007 and 2008   PILONIDAL CYST EXCISION  age 88   PLACEMENT OF SETON N/A 11/06/2015   Procedure: PLACEMENT OF SETON;  Surgeon: Romie Levee, MD;  Location: [facility];  Service: General;  Laterality: N/A;   PLACEMENT OF SETON  07/2018   at [facility]   RECTAL EXAM UNDER ANESTHESIA N/A 01/18/2019   Procedure: ANAL EXAM UNDER ANESTHESIA,  INCISION AND DRAINAGE, SETON PLACEMENT;  Surgeon: Romie Levee, MD;  Location: [facility];  Service: General;  Laterality: N/A;   UPPER GASTROINTESTINAL ENDOSCOPY      Current Medications: Current Facility-Administered Medications  Medication Dose Route Frequency Provider Last Rate Last Admin   acetaminophen (TYLENOL) tablet 650 mg  650 mg Oral Q6H PRN Motley-Mangrum, Jadeka A, PMHNP       alum & mag hydroxide-simeth (MAALOX/MYLANTA) 200-200-20 MG/5ML suspension 30 mL  30 mL Oral Q4H PRN Motley-Mangrum, Jadeka A, PMHNP       dicyclomine (BENTYL) capsule 10 mg  10 mg Oral TID AC Motley-Mangrum, Jadeka A, PMHNP   10 mg at 04/17/23 1146   doxycycline (VIBRA-TABS) tablet 100 mg  100 mg Oral Q12H Floydene Flock, MD   100 mg at 04/17/23 3086   feeding supplement (ENSURE ENLIVE / ENSURE PLUS) liquid 237 mL  1 Bottle Oral TID BM Verner Chol, MD       hydrOXYzine (ATARAX) tablet 25 mg  25 mg Oral TID PRN Motley-Mangrum, Geralynn Ochs A, PMHNP   25 mg at 04/16/23 1633   LORazepam (ATIVAN) injection 1 mg  1 mg Intramuscular Q4H PRN Remington, Amber E, NP       LORazepam (ATIVAN) injection 2 mg  2 mg Intramuscular TID PRN Motley-Mangrum, Jadeka A, PMHNP       LORazepam (ATIVAN) tablet 1 mg  1 mg Oral Q4H PRN Remington, Amber E, NP   1 mg at 04/16/23 1633   magnesium hydroxide (MILK OF MAGNESIA) suspension 30 mL  30 mL Oral Daily PRN Motley-Mangrum, Jadeka A, PMHNP       multivitamin with minerals tablet 1 tablet  1 tablet Oral Daily Verner Chol, MD   1 tablet at 04/17/23 0828   ondansetron (ZOFRAN) injection 4 mg  4 mg Intravenous Q6H PRN Verner Chol, MD   4 mg at 04/16/23 0905   ondansetron (ZOFRAN-ODT) disintegrating tablet 4 mg  4 mg Oral Q8H PRN Verner Chol, MD       predniSONE (DELTASONE) tablet 30  mg  30 mg Oral Q breakfast Motley-Mangrum, Jadeka A, PMHNP   30 mg at 04/17/23 2725    Lab Results:  No results found. However, due to the size of the patient record, not all encounters were searched. Please check Results Review for a complete set of results.   Blood  Alcohol level:  Lab Results  Component Value Date   ETH <10 04/13/2023   ETH <10 03/28/2023    Metabolic Disorder Labs: Lab Results  Component Value Date   HGBA1C 4.8 04/03/2023   MPG 91.06 04/03/2023   No results found for: "PROLACTIN" Lab Results  Component Value Date   CHOL 209 (H) 04/03/2023   TRIG 75 04/03/2023   HDL 65 04/03/2023   CHOLHDL 3.2 04/03/2023   VLDL 15 04/03/2023   LDLCALC 129 (H) 04/03/2023    Physical Findings: AIMS:  , ,  ,  ,    CIWA:    COWS:      Psychiatric Specialty Exam:  Presentation  General Appearance:  Appropriate for Environment; Disheveled  Eye Contact: Fair  Speech: Clear and Coherent  Speech Volume: Decreased    Mood and Affect  Mood: Depressed; Anxious; Hopeless  Affect: Depressed; Flat   Thought Process  Thought Processes: Coherent  Descriptions of Associations:Intact  Orientation:Full (Time, Place and Person)  Thought Content:Illogical  Hallucinations:Hallucinations: Other (comment)  Ideas of Reference:None  Suicidal Thoughts:Suicidal Thoughts: Yes, Passive SI Passive Intent and/or Plan: Without Intent; Without Plan  Homicidal Thoughts:Homicidal Thoughts: No   Sensorium  Memory: Immediate Fair; Recent Fair; Remote Fair  Judgment: Impaired  Insight: Shallow   Executive Functions  Concentration: Poor  Attention Span: Poor  Recall: [employer] of Knowledge: Fair  Language: Fair   Psychomotor Activity  Psychomotor Activity: Psychomotor Activity: Normal  Musculoskeletal: Strength & Muscle Tone: decreased Gait & Station: unsteady Assets  Assets: Manufacturing systems engineer; Desire for Improvement; Physical Health    Physical Exam: Physical Exam Vitals and nursing note reviewed.  HENT:     Head: Normocephalic.     Nose: Nose normal.     Mouth/Throat:     Mouth: Mucous membranes are moist.  Eyes:     Pupils: Pupils are equal, round, and reactive to light.  Pulmonary:      Breath sounds: Normal breath sounds.  Abdominal:     General: Bowel sounds are normal.  Neurological:     General: No focal deficit present.     Mental Status: He is alert.    Review of Systems  Constitutional:  Positive for malaise/fatigue.  HENT: Negative.    Eyes: Negative.   Cardiovascular: Negative.   Gastrointestinal: Negative.   Skin: Negative.   Blood pressure 128/69, pulse 73, temperature 98.4 F (36.9 C), resp. rate 18, height 6\' 1"  (1.854 m), weight 60.6 kg, SpO2 99%. Body mass index is 17.61 kg/m.  Diagnosis: Principal Problem:   MDD (major depressive disorder), recurrent severe, without psychosis ([name]) Active Problems:   GAD (generalized anxiety disorder)   Tremor   PLAN: Safety and Monitoring:  -- Involuntary admission to inpatient psychiatric unit for safety, stabilization and treatment  -- Daily contact with patient to assess and evaluate symptoms and progress in treatment  -- Patient's case to be discussed in multi-disciplinary team meeting  -- Observation Level : q15 minute checks  -- Vital signs:  q12 hours  -- Precautions: suicide, elopement, and assault -- Encouraged patient to participate in unit milieu and in scheduled group therapies  2. Psychiatric Diagnoses and  Treatment:  Once patient is more medically stable can consider adding Effexor 75 mg to help with the anxiety/depression and some Abilify for mood stabilization given the intensity of depression  Hold serotonergic and QT prolonging medication  (Lexapro, Buspar, Remeron)                       -- Metabolic profile and EKG monitoring (BMI: 17.61; Lipid Panel: HbgA1c: QTc: 498)     3. Medical Issues Being Addressed:   Working out on serotonin syndrome  4. Discharge Planning:   -- Social work and case management to assist with discharge planning and identification of hospital follow-up needs prior to discharge  -- Estimated LOS: 3-4 days  Verner Chol, MD 04/17/2023, 1:38 PM

## 2023-04-17 NOTE — Progress Notes (Signed)
   04/17/23 1935  Psych Admission Type (Psych Patients Only)  Admission Status Involuntary  Psychosocial Assessment  Patient Complaints Anxiety;Depression;Self-harm thoughts  Eye Contact Fair  Facial Expression Flat  Affect Depressed  Speech Soft  Interaction Minimal  Motor Activity Slow  Appearance/Hygiene Disheveled;In scrubs  Behavior Characteristics Cooperative;Anxious  Mood Depressed;Anxious  Thought Process  Coherency WDL  Content WDL  Delusions None reported or observed  Perception WDL  Hallucination None reported or observed  Judgment Impaired  Confusion None  Danger to Self  Current suicidal ideation? Active  Description of Suicide Plan no plan; stated, "I want to die." No intent to harm himself  Self-Injurious Behavior Some self-injurious ideation observed or expressed.  No lethal plan expressed   Agreement Not to Harm Self Yes  Description of Agreement verbal  Danger to Others  Danger to Others None reported or observed   Progress note   D: Pt seen in his room in the bed. Pt denies HI, AVH. Pt endorses SI without a plan. "I just want to die." Pt contracts for safety and denies a plan. Pt rates pain  0/10. Pt rates anxiety  8/10 and depression 10/10. Pt states that he had his own apartment until a year ago when he moved in with his parents because they needed more care. Sometime during this time, his own health was failing. Pt states that he couldn't take it anymore and took the overdose. About 3 weeks ago, he said that his mobility was limited but he could walk about 50 feet at a time. Four days ago, he said he stopped being able to walk. Pt endorses hopelessness. Says he knows his parents cannot take care of him anymore. Pt has a wound on his right leg that he said was a result of falling at home. "I got up too fast and was lightheaded and fell." Pt is able to pull himself to a standing position and pivot to the geri-chair. Pt can stand without wobbling to urinate.  Tele-sitter in place in room. Pt educated on call bell use. Discussed turning/positioning regimen. No other concerns noted at this time.  A: Pt provided support and encouragement. Pt given scheduled medication as prescribed. PRNs as appropriate. Q15 min checks for safety.   R: Pt safe on the unit. Will continue to monitor.

## 2023-04-17 NOTE — Plan of Care (Signed)
 [name] is a 58 y.o. male patient. No diagnosis found. 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 06/15/2018   Cancer of skin of neck    Cataract    Chronic mesenteric ischemia ([name])    Chronic pain syndrome 07/09/2016   Crohn's disease of small and large intestines ([name])    followed by dr Baldo Ash gessner   Dairy product intolerance    Diarrhea, functional    Family history of adverse reaction to anesthesia    Grover's disease    transient acantholytic dermatosis   History of alcohol abuse    History of basal cell carcinoma excision    2013 left leg   History of Clostridium difficile    10/ 2014   History of multiple concussions    x6   last one Jan 2017 per pt--  no residual   History of substance abuse ([name])    quit 1997 per pt   History of suicide attempt    05-18-2012  overdose /  failure to thrive   Iron deficiency anemia due to chronic blood loss    Major depression, recurrent, chronic ([name])    Osteopenia    Personal history of adenomatous colonic polyps 12/2010, 03/2012   12/2010 - 8 mm serrated adenoma of rectum   Portal vein thrombosis 03/21/2015   right   Primary sclerosing cholangitis    ? 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   Substance abuse ([name]) 1997   Alcohol   Vitamin A deficiency 05/23/2018   Vitamin B6 deficiency 07/27/2018   Vitamin C deficiency 05/23/2018   Current Facility-Administered Medications  Medication Dose Route Frequency Provider Last Rate Last Admin   acetaminophen (TYLENOL) tablet 650 mg  650 mg Oral Q6H PRN Motley-Mangrum, Jadeka A, PMHNP       alum & mag hydroxide-simeth (MAALOX/MYLANTA) 200-200-20 MG/5ML suspension 30 mL  30  mL Oral Q4H PRN Motley-Mangrum, Jadeka A, PMHNP       dicyclomine (BENTYL) capsule 10 mg  10 mg Oral TID AC Motley-Mangrum, Jadeka A, PMHNP   10 mg at 04/16/23 1633   doxycycline (VIBRA-TABS) tablet 100 mg  100 mg Oral Q12H Floydene Flock, MD   100 mg at 04/16/23 2143   hydrOXYzine (ATARAX) tablet 25 mg  25 mg Oral TID PRN Motley-Mangrum, Jadeka A, PMHNP   25 mg at 04/16/23 1633   LORazepam (ATIVAN) injection 1 mg  1 mg Intramuscular Q4H PRN Remington, Amber E, NP       LORazepam (ATIVAN) injection 2 mg  2 mg Intramuscular TID PRN Motley-Mangrum, Jadeka A, PMHNP       LORazepam (ATIVAN) tablet 1 mg  1 mg Oral Q4H PRN Remington, Amber E, NP   1 mg at 04/16/23 1633   magnesium hydroxide (MILK OF MAGNESIA) suspension 30 mL  30 mL Oral Daily PRN Motley-Mangrum, Jadeka A, PMHNP       multivitamin with minerals tablet 1 tablet  1 tablet Oral Daily Verner Chol, MD   1 tablet at 04/16/23 0940   ondansetron (ZOFRAN) injection 4 mg  4 mg Intravenous Q6H PRN Verner Chol, MD  4 mg at 04/16/23 0905   ondansetron (ZOFRAN-ODT) disintegrating tablet 4 mg  4 mg Oral Q8H PRN Verner Chol, MD       predniSONE (DELTASONE) tablet 30 mg  30 mg Oral Q breakfast Motley-Mangrum, Jadeka A, PMHNP   30 mg at 04/16/23 0941   Allergies  Allergen Reactions   Asacol [Mesalamine] Diarrhea    abd pain   Azathioprine Diarrhea    abd pain   Gluten Meal Diarrhea   Metoprolol Tartrate Nausea Only    Stomach upset   Mycophenolate Mofetil Diarrhea    abd pain   Other Other (See Comments)    NUTS; constipation   Wheat Diarrhea    Constipation; flatulence; abd pain   Amoxicillin Other (See Comments)    Lots of gas   Tape Itching and Rash    Please use "paper" tape   Principal Problem:   MDD (major depressive disorder), recurrent severe, without psychosis ([name]) Active Problems:   GAD (generalized anxiety disorder)   Tremor  Blood pressure 109/76, pulse 73, temperature 98.3 F (36.8 C), resp. rate 15,  height 6\' 1"  (1.854 m), weight 60.6 kg, SpO2 98%.  Subjective Objective Assessment & Plan  Macai Sisneros B Lisa-Marie Rueger 04/17/2023

## 2023-04-17 NOTE — Progress Notes (Signed)
 Pt reported a wound on right leg. Minimal drainage, Would was cleaned and mepilex dressing applied.  Pt denies experiencing any pain during the shift. Pt denies experiencing any SI/HI, or AVH during the shift.  Scheduled medications administered  per Doctor orders. Frequent contact made. Routine safety checks conducted. Pt is on a tele sitter for continuous monitoring due to high  fall risk.

## 2023-04-17 NOTE — Plan of Care (Signed)
   Problem: Education: Goal: Emotional status will improve Outcome: Progressing Goal: Mental status will improve Outcome: Progressing

## 2023-04-18 NOTE — Plan of Care (Signed)
  Problem: Activity: Goal: Sleeping patterns will improve Outcome: Progressing   Problem: Coping: Goal: Ability to verbalize frustrations and anger appropriately will improve Outcome: Progressing   Problem: Physical Regulation: Goal: Ability to maintain clinical measurements within normal limits will improve Outcome: Progressing   Problem: Safety: Goal: Periods of time without injury will increase Outcome: Progressing

## 2023-04-18 NOTE — Group Note (Signed)
 Recreation Therapy Group Note   Group Topic:Healthy Support Systems  Group Date: 04/18/2023 Start Time: 1500 End Time: 1550 Facilitators: Rosina Lowenstein, LRT, CTRS Location:  Dayroom  Group Description: Straw Bridge. In groups or individually, patients were given 10 plastic drinking straws and an equal length of masking tape. Using the materials provided, patients were instructed to build a free-standing bridge-like structure to suspend an everyday item (ex: deck of cards) off the floor or table surface. All materials were required to be used in Secondary school teacher. LRT facilitated post-activity discussion reviewing the importance of having strong and healthy support systems in our lives. LRT discussed how the people in our lives serve as the tape and the deck of cards we placed on top of our straw structure are the stressors we face in daily life. LRT and pts discussed what happens in our life when things get too heavy for [place], and we don't have strong supports outside of the hospital. Pt shared 2 of their healthy supports in their life aloud in the group.   Goal Area(s) Addressed:  Patient will identify 2 healthy supports in their life. Patient will identify skills to successfully complete activity. Patient will identify correlation of this activity to life post-discharge.  Patient will build on frustration tolerance skills. Patient will increase team building and communication skills.   Affect/Mood: N/A   Participation Level: Did not attend    Clinical Observations/Individualized Feedback: Patient did not attend group.   Plan: Continue to engage patient in RT group sessions 2-3x/week.   Rosina Lowenstein, LRT, CTRS 04/18/2023 4:46 PM

## 2023-04-18 NOTE — Progress Notes (Signed)
 Physical Therapy Treatment Patient Details Name: [name] MRN: [id number] DOB: 24-Jun-1965 Today's Date: 04/18/2023   History of Present Illness [name] is a 58 year old Caucasian male with a history of MDD, anxiety, Crohn's disease, and chronic mesenteric ischemia who presented to ED on 04/13/23 at 0404 for suicide attempt by ingestion of 28 tablets of 10mg  Lexapro at 0200. Patient sent to behavior Med unit.    PT Comments  Pt finishing OT session.  Needed time on commode then is able to attend to his own needs and transfer back into recliner in bathroom with supervision.  Stands to sink to wash his hands with general weakness but no LOB.  He is taken to hallway for gait.  He is able to walk 26' with rail and light HHA as no walker is available at the time. Gait quality does decrease with distance with increased L limp noted.  After rest in chair, he is able to stand again for light standing ex before fatigue.  Opts to return to bed after session with supervision for transfer.   If plan is discharge home, recommend the following: A little help with walking and/or transfers;A little help with bathing/dressing/bathroom;Assist for transportation;Help with stairs or ramp for entrance;Assistance with cooking/housework;Direct supervision/assist for financial management;Direct supervision/assist for medications management   Can travel by private vehicle        Equipment Recommendations  Rolling walker (2 wheels)    Recommendations for Other Services       Precautions / Restrictions Precautions Precautions: Fall Restrictions Weight Bearing Restrictions Per Provider Order: No     Mobility  Bed Mobility Overal bed mobility: Needs Assistance               Patient Response: Cooperative, Flat affect  Transfers Overall transfer level: Needs assistance Equipment used: 1 person hand held assist, None Transfers: Sit to/from Stand, Bed to chair/wheelchair/BSC Sit to  Stand: Contact guard assist Stand pivot transfers: Supervision, Contact guard assist              Ambulation/Gait Ambulation/Gait assistance: Contact guard assist, Min assist Gait Distance (Feet): 80 Feet Assistive device:  (handrail in hallway - no walker available) Gait Pattern/deviations: Decreased step length - right, Decreased step length - left, Decreased stride length, Decreased dorsiflexion - right, Decreased dorsiflexion - left, Trunk flexed Gait velocity: decr     General Gait Details: walks with one rail and one HHA with short shuffling steps and L limp which increases with distance   Stairs             Wheelchair Mobility     Tilt Bed Tilt Bed Patient Response: Cooperative, Flat affect  Modified Rankin (Stroke Patients Only)       Balance Overall balance assessment: Needs assistance Sitting-balance support: Feet supported Sitting balance-Leahy Scale: Good     Standing balance support: No upper extremity supported Standing balance-Leahy Scale: Fair Standing balance comment: does fair static but quality decreases with dynamic tasks                            Communication Communication Communication: No apparent difficulties  Cognition Arousal: Alert Behavior During Therapy: Flat affect   PT - Cognitive impairments: No apparent impairments                       PT - Cognition Comments: reports feeling depressed and sad about his physical abilities Following commands: Intact  Cueing Cueing Techniques: Verbal cues  Exercises      General Comments        Pertinent Vitals/Pain Pain Assessment Pain Assessment: Faces Faces Pain Scale: Hurts little more Pain Location: L hip Pain Descriptors / Indicators: Sore Pain Intervention(s): Limited activity within patient's tolerance, Monitored during session, Repositioned    Home Living Family/patient expects to be discharged to:: Private residence Living Arrangements:  Parent Available Help at Discharge: Family;Available PRN/intermittently Type of Home: House       Alternate Level Stairs-Number of Steps: 12 (his bed/bath is on 2nd floor) Home Layout: Two level Home Equipment: Agricultural consultant (2 wheels)      Prior Function            PT Goals (current goals can now be found in the care plan section) Progress towards PT goals: Progressing toward goals    Frequency    Min 1X/week      PT Plan      Co-evaluation              AM-PAC PT "6 Clicks" Mobility   Outcome Measure  Help needed turning from your back to your side while in a flat bed without using bedrails?: None Help needed moving from lying on your back to sitting on the side of a flat bed without using bedrails?: None Help needed moving to and from a bed to a chair (including a wheelchair)?: A Little Help needed standing up from a chair using your arms (e.g., wheelchair or bedside chair)?: A Little Help needed to walk in hospital room?: A Little Help needed climbing 3-5 steps with a railing? : A Lot 6 Click Score: 19    End of Session Equipment Utilized During Treatment: Gait belt Activity Tolerance: Patient limited by fatigue Patient left: in bed Nurse Communication: Mobility status PT Visit Diagnosis: Muscle weakness (generalized) (M62.81);Difficulty in walking, not elsewhere classified (R26.2)     Time: 8469-6295 PT Time Calculation (min) (ACUTE ONLY): 18 min  Charges:    $Gait Training: 8-22 mins PT General Charges $$ ACUTE PT VISIT: 1 Visit                   Danielle Dess, PTA 04/18/23, 3:45 PM

## 2023-04-18 NOTE — Group Note (Signed)
 Date:  04/18/2023 Time:  10:12 PM  Group Topic/Focus:  Wrap-Up Group:   The focus of this group is to help patients review their daily goal of treatment and discuss progress on daily workbooks.    Participation Level:  Did Not Attend  [name] 04/18/2023, 10:12 PM

## 2023-04-18 NOTE — Plan of Care (Signed)
   Problem: Physical Regulation: Goal: Ability to maintain clinical measurements within normal limits will improve Outcome: Progressing   Problem: Safety: Goal: Periods of time without injury will increase Outcome: Progressing

## 2023-04-18 NOTE — Progress Notes (Signed)
   04/18/23 1300  Psych Admission Type (Psych Patients Only)  Admission Status Involuntary  Psychosocial Assessment  Patient Complaints Anxiety;Depression  Eye Contact Brief  Facial Expression Flat  Affect Depressed  Speech Soft  Interaction Minimal  Motor Activity Tremors;Slow  Appearance/Hygiene Disheveled  Behavior Characteristics Cooperative  Mood Depressed  Thought Process  Coherency WDL  Content WDL  Delusions None reported or observed  Perception WDL  Hallucination None reported or observed  Judgment Impaired  Confusion WDL  Danger to Self  Current suicidal ideation? Passive  Description of Suicide Plan no plan  Agreement Not to Harm Self Yes  Description of Agreement verbal  Danger to Others  Danger to Others None reported or observed

## 2023-04-18 NOTE — Progress Notes (Signed)
 [facility] MD Progress Note  04/18/2023 4:48 PM [name]  MRN:  [id number]  [name] is a 58 year old Caucasian male with a history of MDD, anxiety, Crohn's disease, and chronic mesenteric ischemia who presented to [facility] emergency department on 04/13/23 at 0404 for suicide attempt by ingestion of 28 tablets of 10mg  Lexapro at 0200. Per ED documentation, patient had some mild symptoms of serotonin syndrome on arrival, including acute agitation and clonus, with symptomatic improvement with benzodiazepines. Did have a return of symptoms requiring additional dose of lorazepam. Poison control advised the total amount of ingested Lexapro likely is not enough to cause serotonin syndrome by itself and they recommended 8-hour cardiac monitoring. Qtc 498.  Subjective:  Chart reviewed, case discussed in multidisciplinary meeting, patient seen during rounds. Patient reportedly overdosed on Lexapro and admitted due to serotonin syndrome. He's on a sitter. He talks about him not being able to do it anymore. He continues to endorse SI with no active plan, denies HI/intent/plan.  He denies auditory/visual hallucinations. Plan to continue current treatment plan.  Sleep: Fair  Appetite:  Fair  Past Psychiatric History: see h&P Family History:  Family History  Problem Relation Age of Onset   Heart disease Father    Thyroid cancer Father    Allergies Father    Clotting disorder Father    Breast cancer Mother    Stomach cancer Mother    Colon cancer Neg Hx    Esophageal cancer Neg Hx    Rectal cancer Neg Hx    Social History:  Social History   Substance and Sexual Activity  Alcohol Use No   Alcohol/week: 0.0 standard drinks of alcohol     Social History   Substance and Sexual Activity  Drug Use Not Currently   Types: Cocaine, Marijuana   Comment: QUIT USING DRUGS IN 1997    Social History   Socioeconomic History   Marital status: Single    Spouse name: Not on file   Number of  children: 1   Years of education: Not on file   Highest education level: Not on file  Occupational History   Occupation: Disability   Tobacco Use   Smoking status: Former    Current packs/day: 0.00    Average packs/day: 1 pack/day for 15.0 years (15.0 ttl pk-yrs)    Types: Cigarettes    Start date: 03/28/1982    Quit date: 03/28/1997    Years since quitting: 26.0   Smokeless tobacco: Never  Vaping Use   Vaping status: Never Used  Substance and Sexual Activity   Alcohol use: No    Alcohol/week: 0.0 standard drinks of alcohol   Drug use: Not Currently    Types: Cocaine, Marijuana    Comment: QUIT USING DRUGS IN 1997   Sexual activity: Not Currently  Other Topics Concern   Not on file  Social History Narrative   Single, history of substance abuse in recovery   Previously occupied on medical disability   1 child  Daughter Wellsite geologist, lives and works in [place]   Elderly parents involved and he helps them   1 brother   Social Drivers of Corporate investment banker Strain: Low Risk  (08/28/2019)   Overall Financial Resource Strain (CARDIA)    Difficulty of Paying Living Expenses: Not hard at all  Food Insecurity: Food Insecurity Present (04/13/2023)   Hunger Vital Sign    Worried About Running Out of Food in the Last Year: Never true  Ran Out of Food in the Last Year: Sometimes true  Transportation Needs: No Transportation Needs (04/13/2023)   PRAPARE - Administrator, Civil Service (Medical): No    Lack of Transportation (Non-Medical): No  Physical Activity: Insufficiently Active (11/08/2019)   Exercise Vital Sign    Days of Exercise per Week: 3 days    Minutes of Exercise per Session: 30 min  Stress: Stress Concern Present (08/28/2019)   [employer] of Occupational Health - Occupational Stress Questionnaire    Feeling of Stress : Very much  Social Connections: Socially Isolated (03/28/2023)   Social Connection and Isolation Panel [NHANES]    Frequency of  Communication with Friends and Family: Three times a week    Frequency of Social Gatherings with Friends and Family: Twice a week    Attends Religious Services: Never    Database administrator or Organizations: No    Attends Engineer, structural: Never    Marital Status: Divorced   Past Medical History:  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 06/15/2018   Cancer of skin of neck    Cataract    Chronic mesenteric ischemia ([name])    Chronic pain syndrome 07/09/2016   Crohn's disease of small and large intestines ([name])    followed by dr Baldo Ash gessner   Dairy product intolerance    Diarrhea, functional    Family history of adverse reaction to anesthesia    Grover's disease    transient acantholytic dermatosis   History of alcohol abuse    History of basal cell carcinoma excision    2013 left leg   History of Clostridium difficile    10/ 2014   History of multiple concussions    x6   last one Jan 2017 per pt--  no residual   History of substance abuse ([name])    quit 1997 per pt   History of suicide attempt    05-18-2012  overdose /  failure to thrive   Iron deficiency anemia due to chronic blood loss    Major depression, recurrent, chronic ([name])    Osteopenia    Personal history of adenomatous colonic polyps 12/2010, 03/2012   12/2010 - 8 mm serrated adenoma of rectum   Portal vein thrombosis 03/21/2015   right   Primary sclerosing cholangitis    ? 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   Substance abuse ([name]) 1997   Alcohol   Vitamin A deficiency 05/23/2018   Vitamin B6 deficiency 07/27/2018   Vitamin C deficiency 05/23/2018    Past Surgical History:  Procedure  Laterality Date   ABDOMINAL AORTAGRAM N/A 03/29/2012   Procedure: ABDOMINAL Ronny Flurry;  Surgeon: Nada Libman, MD;  Location: [facility] CATH LAB;  Service: Cardiovascular;  Laterality: N/A;   ADENOIDECTOMY  age 63   BIOPSY  05/31/2018   Procedure: BIOPSY;  Surgeon: Rachael Fee, MD;  Location: [facility] ENDOSCOPY;  Service: Endoscopy;;   CATARACT EXTRACTION W/ INTRAOCULAR LENS  IMPLANT, BILATERAL  2009   COLONOSCOPY  2001, 05/02/2003, 01/28/11   2012: Right colon Crohn's, rectal polyp   COLONOSCOPY  03/31/2012  Procedure: COLONOSCOPY;  Surgeon: Beverley Fiedler, MD;  Location: [facility] ENDOSCOPY;  Service: Gastroenterology;  Laterality: N/A;   COLONOSCOPY WITH PROPOFOL N/A 05/31/2018   Procedure: COLONOSCOPY WITH PROPOFOL;  Surgeon: Rachael Fee, MD;  Location: [facility] ENDOSCOPY;  Service: Endoscopy;  Laterality: N/A;   ESOPHAGOGASTRODUODENOSCOPY  01/28/2011   Normal   FOOT SURGERY Right age 12   MOHS SURGERY Left 11/2013   left ankle parakerotosis    PERCUTANEOUS LIVER BIOPSY  2007 and 2008   PILONIDAL CYST EXCISION  age 33   PLACEMENT OF SETON N/A 11/06/2015   Procedure: PLACEMENT OF SETON;  Surgeon: Romie Levee, MD;  Location: [facility];  Service: General;  Laterality: N/A;   PLACEMENT OF SETON  07/2018   at [facility]   RECTAL EXAM UNDER ANESTHESIA N/A 01/18/2019   Procedure: ANAL EXAM UNDER ANESTHESIA,  INCISION AND DRAINAGE, SETON PLACEMENT;  Surgeon: Romie Levee, MD;  Location: [facility];  Service: General;  Laterality: N/A;   UPPER GASTROINTESTINAL ENDOSCOPY      Current Medications: Current Facility-Administered Medications  Medication Dose Route Frequency Provider Last Rate Last Admin   acetaminophen (TYLENOL) tablet 650 mg  650 mg Oral Q6H PRN Motley-Mangrum, Jadeka A, PMHNP       alum & mag hydroxide-simeth (MAALOX/MYLANTA) 200-200-20 MG/5ML suspension 30 mL  30 mL Oral Q4H PRN Motley-Mangrum, Jadeka A, PMHNP       dicyclomine (BENTYL) capsule 10 mg  10  mg Oral TID AC Motley-Mangrum, Jadeka A, PMHNP   10 mg at 04/18/23 1605   doxycycline (VIBRA-TABS) tablet 100 mg  100 mg Oral Q12H Floydene Flock, MD   100 mg at 04/18/23 4098   feeding supplement (ENSURE ENLIVE / ENSURE PLUS) liquid 237 mL  1 Bottle Oral TID BM Verner Chol, MD   237 mL at 04/18/23 1324   hydrOXYzine (ATARAX) tablet 25 mg  25 mg Oral TID PRN Motley-Mangrum, Geralynn Ochs A, PMHNP   25 mg at 04/17/23 2020   LORazepam (ATIVAN) injection 1 mg  1 mg Intramuscular Q4H PRN Remington, Amber E, NP       LORazepam (ATIVAN) injection 2 mg  2 mg Intramuscular TID PRN Motley-Mangrum, Jadeka A, PMHNP       LORazepam (ATIVAN) tablet 1 mg  1 mg Oral Q4H PRN Remington, Amber E, NP   1 mg at 04/18/23 0908   magnesium hydroxide (MILK OF MAGNESIA) suspension 30 mL  30 mL Oral Daily PRN Motley-Mangrum, Jadeka A, PMHNP       multivitamin with minerals tablet 1 tablet  1 tablet Oral Daily Verner Chol, MD   1 tablet at 04/18/23 0908   ondansetron (ZOFRAN) injection 4 mg  4 mg Intravenous Q6H PRN Verner Chol, MD   4 mg at 04/16/23 0905   ondansetron (ZOFRAN-ODT) disintegrating tablet 4 mg  4 mg Oral Q8H PRN Verner Chol, MD       predniSONE (DELTASONE) tablet 30 mg  30 mg Oral Q breakfast Motley-Mangrum, Jadeka A, PMHNP   30 mg at 04/18/23 0908    Lab Results:  No results found. However, due to the size of the patient record, not all encounters were searched. Please check Results Review for a complete set of results.   Blood Alcohol level:  Lab Results  Component Value Date   [facility] <10 04/13/2023   ETH <10 03/28/2023    Metabolic Disorder Labs: Lab Results  Component Value Date   HGBA1C 4.8 04/03/2023   MPG 91.06  04/03/2023   No results found for: "PROLACTIN" Lab Results  Component Value Date   CHOL 209 (H) 04/03/2023   TRIG 75 04/03/2023   HDL 65 04/03/2023   CHOLHDL 3.2 04/03/2023   VLDL 15 04/03/2023   LDLCALC 129 (H) 04/03/2023    Physical Findings: AIMS:  , ,  ,  ,     CIWA:    COWS:      Psychiatric Specialty Exam:  Presentation  General Appearance:  Appropriate for Environment; Disheveled  Eye Contact: Fair  Speech: Clear and Coherent  Speech Volume: Decreased    Mood and Affect  Mood: Depressed; Anxious; Hopeless  Affect: Depressed; Flat   Thought Process  Thought Processes: Coherent  Descriptions of Associations:Intact  Orientation:Full (Time, Place and Person)  Thought Content:Illogical  Hallucinations:Hallucinations: Other (comment)  Ideas of Reference:None  Suicidal Thoughts:Suicidal Thoughts: Yes, Passive SI Passive Intent and/or Plan: Without Intent; Without Plan  Homicidal Thoughts:Homicidal Thoughts: No   Sensorium  Memory: Immediate Fair; Recent Fair; Remote Fair  Judgment: Impaired  Insight: Shallow   Executive Functions  Concentration: Poor  Attention Span: Poor  Recall: [employer] of Knowledge: Fair  Language: Fair   Psychomotor Activity  Psychomotor Activity: Psychomotor Activity: Normal  Musculoskeletal: Strength & Muscle Tone: decreased Gait & Station: unsteady Assets  Assets: Manufacturing systems engineer; Desire for Improvement; Physical Health    Physical Exam: Physical Exam Vitals and nursing note reviewed.  HENT:     Head: Normocephalic.     Nose: Nose normal.     Mouth/Throat:     Mouth: Mucous membranes are moist.  Eyes:     Pupils: Pupils are equal, round, and reactive to light.  Pulmonary:     Breath sounds: Normal breath sounds.  Abdominal:     General: Bowel sounds are normal.  Neurological:     General: No focal deficit present.     Mental Status: He is alert.    Review of Systems  Constitutional:  Positive for malaise/fatigue.  HENT: Negative.    Eyes: Negative.   Cardiovascular: Negative.   Gastrointestinal: Negative.   Skin: Negative.   Blood pressure 112/73, pulse 73, temperature (!) 97.5 F (36.4 C), resp. rate 18, height 6\' 1"  (1.854 m),  weight 60.6 kg, SpO2 99%. Body mass index is 17.61 kg/m.  Diagnosis: Principal Problem:   MDD (major depressive disorder), recurrent severe, without psychosis ([name]) Active Problems:   GAD (generalized anxiety disorder)   Tremor   PLAN: Safety and Monitoring:  -- Involuntary admission to inpatient psychiatric unit for safety, stabilization and treatment  -- Daily contact with patient to assess and evaluate symptoms and progress in treatment  -- Patient's case to be discussed in multi-disciplinary team meeting  -- Observation Level : q15 minute checks  -- Vital signs:  q12 hours  -- Precautions: suicide, elopement, and assault -- Encouraged patient to participate in unit milieu and in scheduled group therapies  2. Psychiatric Diagnoses and Treatment:  Once patient is more medically stable can consider adding Effexor 75 mg to help with the anxiety/depression and some Abilify for mood stabilization given the intensity of depression  Hold serotonergic and QT prolonging medication  (Lexapro, Buspar, Remeron)                       -- Metabolic profile and EKG monitoring (BMI: 17.61; Lipid Panel: HbgA1c: QTc: 498)     3. Medical Issues Being Addressed:   Working out on serotonin syndrome  4. Discharge Planning:   -- Social work and case management to assist with discharge planning and identification of hospital follow-up needs prior to discharge  -- Estimated LOS: 3-4 days  Timmie Foerster, MD 04/18/2023, 4:48 PM

## 2023-04-18 NOTE — B[facility] Counselor (Addendum)
 CSW attempted to contact [name] to complete SPE, CSW unable to reach left HIPAA compliant VM requesting return call.   ADDENDUM:   Phone number for [name] is [phone]   Reynaldo Minium, [name], [facility] 04/18/2023 9:22 AM

## 2023-04-18 NOTE — Progress Notes (Signed)
   04/18/23 0600  15 Minute Checks  Location Bedroom  Visual Appearance Calm  Behavior Sleeping  Sleep (Behavioral Health Patients Only)  Calculate sleep? (Click Yes once per 24 hr at 0600 safety check) Yes  Documented sleep last 24 hours 11.25

## 2023-04-18 NOTE — [facility] Suicide Risk Assessment (Signed)
 [facility] INPATIENT:  Family/Significant Other Suicide Prevention Education  Suicide Prevention Education:  Education Completed; Gayla Medicus, [phone], With written consent from the patient, the family member/significant other has been provided the following suicide prevention education, prior to the and/or following the discharge of the patient.  The suicide prevention education provided includes the following: Suicide risk factors Suicide prevention and interventions National Suicide Hotline telephone number [facility] assessment telephone number [facility] Emergency Assistance 911 [facility] and/or Residential Mobile Crisis Unit telephone number  Request made of family/significant other to: Remove weapons (e.g., guns, rifles, knives), all items previously/currently identified as safety concern.   Remove drugs/medications (over-the-counter, prescriptions, illicit drugs), all items previously/currently identified as a safety concern.  The family member/significant other verbalizes understanding of the suicide prevention education information provided.  The family member/significant other agrees to remove the items of safety concern listed above.  Elza Rafter 04/18/2023, 1:12 PM

## 2023-04-18 NOTE — B[facility] Counselor (Signed)
 CSW received return call from [name], pt's brother.    Tom reports that he tries to help pt when he needs help and he talks with pt several times a week.    [name] reports that he does not feel pt is a danger to others, but feels he may be a danger to himself due to inability to care for himself.   CSW inquired about what improvements [name] would like to see before pt leave the hospital. [name] reports " I don't know. I guess I would say he needs to have improvement in his anxiety management and his level of depression, he's severely depressed. He needs to try and do more to work with his doctor's his primary care, to me he needs to see a psychologist or psychiatrist once a month, he needs management for his depression"   [name] reports there are no weapons but reports that he is concerned about his brother's access to medications, stating, "I'd be kind of concerned about him having access to tranquilizers, benzos or whatever."   Tom reports that in the past pt has had difficulty with treatment adherence, stating "He's fairly resistant to getting treatment most of the times and attempts to diagnose himself and treat himself in a way, I guess it has to do with his Chron's he's got a severally limited diet, especially with his reaction to pills. That causes anxiety for him, it basically defeats him to where he feels like nothing can help me."   [name] also reports there is no access to guns, no access to sharp kitchen knives, but that pt tried to kill himself by cutting himself one time. Tom reports that pt currently lives with their mother and father, but that it's difficult for them to support pt, stating "[name] is 78 and my dad is 3. It's difficult for them to kind of control the environment."   Tom wanted CSW to talk with pt's mother about the environment as that is where he mainly resides, " I highly suggest you tallk with my mother. She is the one who helps and supports him the most. She's really   concerned about him coming home to her house. "   CSW will contact pt's mother if given permission.   Tom reports on pt's last suicide attempt he tried to take pills, stating "The pills should have been secure and they weren't.He has this pattern of going to the emergency room coming home, he might go to a facility for a few days to a week.  He struggles saying he needs to go to the ER. That's a big concern that could continue happening. "   Tom reports he is willing to support his brother to find appropriate placement for him.   CSW will continue to make contact with pt's brother as needed for collateral/ disposition.    Reynaldo Minium, [name], [place] 04/28/2023 9:44 AM

## 2023-04-18 NOTE — Group Note (Signed)
 Recreation Therapy Group Note   Group Topic:Health and Wellness  Group Date: 04/18/2023 Start Time: 1100 End Time: 1140 Facilitators: Rosina Lowenstein, LRT, CTRS Location: Dayroom  Group Description: Seated Exercise. LRT discussed the mental and physical benefits of exercise. LRT and group discussed how physical activity can be used as a coping skill. Pt's and LRT followed along to an exercise video on the TV screen that provided a visual representation and audio description of every exercise performed. Pt's encouraged to listen to their bodies and stop at any time if they experience feelings of discomfort or pain. Pts were encouraged to drink water and stay hydrated.   Goal Area(s) Addressed: Patient will learn benefits of physical activity. Patient will identify exercise as a coping skill.  Patient will follow multistep directions. Patient will try a new leisure interest.   Affect/Mood: N/A   Participation Level: Did not attend    Clinical Observations/Individualized Feedback: Patient did not attend group.   Plan: Continue to engage patient in RT group sessions 2-3x/week.   Rosina Lowenstein, LRT, CTRS 04/18/2023 2:09 PM

## 2023-04-18 NOTE — Evaluation (Signed)
 Occupational Therapy Evaluation Patient Details Name: [name] MRN: [id number] DOB: 05-02-1965 Today's Date: 04/18/2023   History of Present Illness   Darnel Mchan is a 58 year old Caucasian male with a history of MDD, anxiety, Crohn's disease, and chronic mesenteric ischemia who presented to ED on 04/13/23 at 0404 for suicide attempt by ingestion of 28 tablets of 10mg  Lexapro at 0200. Patient sent to behavior Med unit.     Clinical Impressions Pt was seen for OT evaluation this date. Pt received in his room, laying down, and agreeable to session. Prior to hospital admission, pt reports being independent with mobility but not moving around much lately. He also notes 1 fall about a week ago, fear of falling, and decreasingly engaging in self care ADL tasks 2/2 fatigue and mental health. Pt requires CGA for ADL transfers and additional handheld assist for taking a few steps. Slight LOB but able to self correct. Completed transfer to recliner and from recliner to std height toilet in bathroom with CGA. Pt instructed in ADL transfer training and able to return demo with VC for techniques. Will benefit from additional instruction. Pt requesting additional time for toileting. Able to identify and demo ability to use call bell on the wall when done. RN and PTA preparing to work with him notified. Pt presents to acute OT demonstrating impaired ADL performance and functional mobility 2/2 decreased activity tolerance, balance, strength, and mental health concerns (See OT problem list for additional functional deficits). Pt endorsing during session that his ultimate goal is "to find a way to die." Pt would benefit from skilled OT services while hospitalized to address noted impairments and functional limitations (see below for any additional details) in order to maximize safety and independence while minimizing falls risk and caregiver burden.    If plan is discharge home, recommend the  following:   A little help with walking and/or transfers;A little help with bathing/dressing/bathroom;Assistance with cooking/housework;Assist for transportation;Direct supervision/assist for medications management;Direct supervision/assist for financial management;Supervision due to cognitive status;Help with stairs or ramp for entrance     Functional Status Assessment   Patient has had a recent decline in their functional status and demonstrates the ability to make significant improvements in function in a reasonable and predictable amount of time.     Equipment Recommendations   Other (comment) (2WW)     Recommendations for Other Services         Precautions/Restrictions   Precautions Precautions: Fall Restrictions Weight Bearing Restrictions Per Provider Order: No     Mobility Bed Mobility Overal bed mobility: Modified Independent             General bed mobility comments: increased time/effort    Transfers Overall transfer level: Needs assistance Equipment used: 1 person hand held assist, None Transfers: Sit to/from Stand Sit to Stand: Contact guard assist                  Balance Overall balance assessment: Needs assistance Sitting-balance support: Feet supported Sitting balance-Leahy Scale: Good     Standing balance support: No upper extremity supported Standing balance-Leahy Scale: Fair Standing balance comment: fair-, slight LOB backwards but able to correct with CGA                           ADL either performed or assessed with clinical judgement   ADL Overall ADL's : Needs assistance/impaired  General ADL Comments: Pt currently requires CGA for ADL tasks involving standing, increased time/effort to complete tasks 2/2 decreased strength, balance, and activity tolerance     Vision         Perception         Praxis         Pertinent Vitals/Pain Pain  Assessment Pain Assessment: 0-10 Pain Score: 4  Pain Location: R hip Pain Descriptors / Indicators: Aching Pain Intervention(s): Limited activity within patient's tolerance, Monitored during session     Extremity/Trunk Assessment Upper Extremity Assessment Upper Extremity Assessment: Generalized weakness   Lower Extremity Assessment Lower Extremity Assessment: Generalized weakness   Cervical / Trunk Assessment Cervical / Trunk Assessment: Normal   Communication Communication Communication: No apparent difficulties   Cognition Arousal: Alert Behavior During Therapy: Flat affect Cognition: No family/caregiver present to determine baseline             OT - Cognition Comments: alert, fair insight into deficits, reports his ultimate goal is "to find a way to die."                 Following commands: Intact       Cueing  General Comments   Cueing Techniques: Verbal cues      Exercises Other Exercises Other Exercises: Pt educated in functional ADL transfer training and addressing fear of falling   Shoulder Instructions      Home Living Family/patient expects to be discharged to:: Private residence Living Arrangements: Parent Available Help at Discharge: Family;Available PRN/intermittently Type of Home: House       Home Layout: Two level Alternate Level Stairs-Number of Steps: 12 (his bed/bath is on 2nd floor) Alternate Level Stairs-Rails: Left Bathroom Shower/Tub: Tub/shower unit;Walk-in shower (access to both, but uses tub/shower)   Firefighter: Standard     Home Equipment: Agricultural consultant (2 wheels)          Prior Functioning/Environment Prior Level of Function : Independent/Modified Independent             Mobility Comments: Patient has not been mobilizing much at home lately but denies AD use, endorses recent fall and fear of falling ADLs Comments: Pt reports fear of falling and decreased strength/energy as reasons he has completed  limited ADL tasks like bathing and grooming. Notes the last "good day" he had he clipped his fingernails. Enjoys listening to instrumental music. Endorses being nervous about medication mgt (nervous he will take it too earlier or too late and one night woke up around 1am and didn't know what to do so he took a medication then decided he was just going to overdose because he didn't want to live anymore.    OT Problem List: Decreased strength;Decreased cognition;Decreased activity tolerance;Impaired balance (sitting and/or standing);Decreased knowledge of use of DME or AE   OT Treatment/Interventions: Self-care/ADL training;Therapeutic exercise;Therapeutic activities;Cognitive remediation/compensation;DME and/or AE instruction;Patient/family education;Balance training      OT Goals(Current goals can be found in the care plan section)   Acute Rehab OT Goals Patient Stated Goal: "find a way to die" OT Goal Formulation: With patient Time For Goal Achievement: 05/02/23 Potential to Achieve Goals: Good ADL Goals Pt Will Transfer to Toilet: with modified independence;ambulating (LRAD) Pt Will Perform Tub/Shower Transfer: ambulating;Shower transfer;Tub transfer;with supervision (LRAd) Additional ADL Goal #1: Pt will identify 2 learned falls prevention strategies to utilize during ADL routines to improve safety and return demo proper use, 3/3 opportunities.   OT Frequency:  Min 1X/week    Co-evaluation  AM-PAC OT "6 Clicks" Daily Activity     Outcome Measure Help from another person eating meals?: None Help from another person taking care of personal grooming?: A Little Help from another person toileting, which includes using toliet, bedpan, or urinal?: A Little Help from another person bathing (including washing, rinsing, drying)?: A Little Help from another person to put on and taking off regular upper body clothing?: None Help from another person to put on and taking off  regular lower body clothing?: A Little 6 Click Score: 20   End of Session Equipment Utilized During Treatment: Gait belt Nurse Communication: Mobility status  Activity Tolerance: Patient tolerated treatment well Patient left: Other (comment) (on toilet in bathroom wiht call bell, RN and PTA aware)  OT Visit Diagnosis: Other abnormalities of gait and mobility (R26.89);Muscle weakness (generalized) (M62.81)                Time: 1610-9604 OT Time Calculation (min): 23 min Charges:  OT General Charges $OT Visit: 1 Visit OT Evaluation $OT Eval Low Complexity: 1 Low OT Treatments $Self Care/Home Management : 8-22 mins  Arman Filter., MPH, MS, OTR/L ascom [phone] 04/18/23, 3:33 PM

## 2023-04-19 DIAGNOSIS — F332 Major depressive disorder, recurrent severe without psychotic features: Secondary | ICD-10-CM | POA: Diagnosis not present

## 2023-04-19 NOTE — Progress Notes (Signed)
 Physical Therapy Treatment Patient Details Name: [name] MRN: [id number] DOB: 06-May-1965 Today's Date: 04/19/2023   History of Present Illness Osmond Steckman is a 58 year old Caucasian male with a history of MDD, anxiety, Crohn's disease, and chronic mesenteric ischemia who presented to ED on 04/13/23 at 0404 for suicide attempt by ingestion of 28 tablets of 10mg  Lexapro at 0200. Patient sent to behavior Med unit.    PT Comments  Able to get in/out of bed and transfer to recliner at bedside without assist.  He is able to walk 60' to dayroom, seated rest then walk back 30' then 62' with seated rest in chair with RW and cga/min a x 1.  Short shuffling steps with general body tremors and flexed knees limited by fatigue.  Gait is improved with RW.  Will see if mobility techs can start visits to increase mobility on unit as he remains unsafe to walk alone on unit.   If plan is discharge home, recommend the following: A little help with walking and/or transfers;A little help with bathing/dressing/bathroom;Assist for transportation;Help with stairs or ramp for entrance;Assistance with cooking/housework;Direct supervision/assist for financial management;Direct supervision/assist for medications management   Can travel by private vehicle        Equipment Recommendations  Rolling walker (2 wheels)    Recommendations for Other Services       Precautions / Restrictions Precautions Precautions: Fall Restrictions Weight Bearing Restrictions Per Provider Order: No     Mobility  Bed Mobility Overal bed mobility: Independent               Patient Response: Cooperative, Flat affect  Transfers Overall transfer level: Modified independent Equipment used: None Transfers: Bed to chair/wheelchair/BSC Sit to Stand: Modified independent (Device/Increase time)                Ambulation/Gait Ambulation/Gait assistance: Contact guard assist, Min assist Gait Distance (Feet):  60 Feet Assistive device: Rolling walker (2 wheels) Gait Pattern/deviations: Decreased step length - right, Decreased step length - left, Decreased stride length, Decreased dorsiflexion - right, Decreased dorsiflexion - left, Trunk flexed Gait velocity: decr     General Gait Details: 60, 40, 30' with RW and cga/min a x 1 general weakness and tremors   Stairs             Wheelchair Mobility     Tilt Bed Tilt Bed Patient Response: Cooperative, Flat affect  Modified Rankin (Stroke Patients Only)       Balance Overall balance assessment: Needs assistance Sitting-balance support: Feet supported Sitting balance-Leahy Scale: Good     Standing balance support: Bilateral upper extremity supported Standing balance-Leahy Scale: Fair Standing balance comment: does fair static but quality decreases with dynamic tasks                            Communication    Cognition Arousal: Alert Behavior During Therapy: Flat affect   PT - Cognitive impairments: No apparent impairments                                Cueing    Exercises      General Comments        Pertinent Vitals/Pain Pain Assessment Pain Assessment: No/denies pain    Home Living  Prior Function            PT Goals (current goals can now be found in the care plan section) Progress towards PT goals: Progressing toward goals    Frequency    Min 1X/week      PT Plan      Co-evaluation              AM-PAC PT "6 Clicks" Mobility   Outcome Measure  Help needed turning from your back to your side while in a flat bed without using bedrails?: None Help needed moving from lying on your back to sitting on the side of a flat bed without using bedrails?: None Help needed moving to and from a bed to a chair (including a wheelchair)?: A Little Help needed standing up from a chair using your arms (e.g., wheelchair or bedside chair)?: A  Little Help needed to walk in hospital room?: A Little Help needed climbing 3-5 steps with a railing? : A Lot 6 Click Score: 19    End of Session Equipment Utilized During Treatment: Gait belt Activity Tolerance: Patient limited by fatigue Patient left: in bed Nurse Communication: Mobility status PT Visit Diagnosis: Muscle weakness (generalized) (M62.81);Difficulty in walking, not elsewhere classified (R26.2)     Time: 4098-1191 PT Time Calculation (min) (ACUTE ONLY): 15 min  Charges:    $Gait Training: 8-22 mins PT General Charges $$ ACUTE PT VISIT: 1 Visit                   Danielle Dess, PTA 04/19/23, 9:21 AM

## 2023-04-19 NOTE — Progress Notes (Signed)
   04/19/23 0559  15 Minute Checks  Location Bedroom  Visual Appearance Calm  Behavior Sleeping  Sleep (Behavioral Health Patients Only)  Calculate sleep? (Click Yes once per 24 hr at 0600 safety check) Yes  Documented sleep last 24 hours 9

## 2023-04-19 NOTE — Progress Notes (Signed)
 [facility] MD Progress Note  04/19/2023 [name]  MRN:  [id number]  [name] is a 58 year old Caucasian male with a history of MDD, anxiety, Crohn's disease, and chronic mesenteric ischemia who presented to [facility] emergency department on 04/13/23 at 0404 for suicide attempt by ingestion of 28 tablets of 10mg  Lexapro at 0200. Per ED documentation, patient had some mild symptoms of serotonin syndrome on arrival, including acute agitation and clonus, with symptomatic improvement with benzodiazepines. Did have a return of symptoms requiring additional dose of lorazepam. Poison control advised the total amount of ingested Lexapro likely is not enough to cause serotonin syndrome by itself and they recommended 8-hour cardiac monitoring. Qtc 498.  Subjective:  Chart reviewed, case discussed in multidisciplinary meeting, patient seen during rounds. Patient reportedly overdosed on Lexapro and admitted due to serotonin syndrome.  Today during assessment patient endorsed depressed mood, he feels helpless and hopeless.  Not much change in mental status since yesterday.  Patient has a Cabin crew in place due to fall risk.  Patient was encouraged to let the sister know in case he needs to get out of bed.  Patient agrees to do so.  We also talked about patient's involuntary status.  Patient is willing to sign voluntary to continue inpatient treatment.  Patient reports that he would like to consider  assisted living facility because he is not able to take care of his ADLs.  This was discussed with Child psychotherapist.  Patient continues to report suicidal thoughts, denies any intention to harm himself on the unit.  Patient denies psychotic or manic symptoms.  Patient was encouraged to attend group and work on coping strategies.   Sleep: Fair  Appetite:  Fair  Past Psychiatric History: see h&P Family History:  Family History  Problem Relation Age of Onset   Heart disease Father    Thyroid cancer Father     Allergies Father    Clotting disorder Father    Breast cancer Mother    Stomach cancer Mother    Colon cancer Neg Hx    Esophageal cancer Neg Hx    Rectal cancer Neg Hx    Social History:  Social History   Substance and Sexual Activity  Alcohol Use No   Alcohol/week: 0.0 standard drinks of alcohol     Social History   Substance and Sexual Activity  Drug Use Not Currently   Types: Cocaine, Marijuana   Comment: QUIT USING DRUGS IN 1997    Social History   Socioeconomic History   Marital status: Single    Spouse name: Not on file   Number of children: 1   Years of education: Not on file   Highest education level: Not on file  Occupational History   Occupation: Disability   Tobacco Use   Smoking status: Former    Current packs/day: 0.00    Average packs/day: 1 pack/day for 15.0 years (15.0 ttl pk-yrs)    Types: Cigarettes    Start date: 03/28/1982    Quit date: 03/28/1997    Years since quitting: 26.0   Smokeless tobacco: Never  Vaping Use   Vaping status: Never Used  Substance and Sexual Activity   Alcohol use: No    Alcohol/week: 0.0 standard drinks of alcohol   Drug use: Not Currently    Types: Cocaine, Marijuana    Comment: QUIT USING DRUGS IN 1997   Sexual activity: Not Currently  Other Topics Concern   Not on file  Social History Narrative  Single, history of substance abuse in recovery   Previously occupied on medical disability   1 child  Daughter Wellsite geologist, lives and works in [place]   Elderly parents involved and he helps them   1 brother   Social Drivers of Corporate investment banker Strain: Low Risk  (08/28/2019)   Overall Financial Resource Strain (CARDIA)    Difficulty of Paying Living Expenses: Not hard at all  Food Insecurity: Food Insecurity Present (04/13/2023)   Hunger Vital Sign    Worried About Running Out of Food in the Last Year: Never true    Ran Out of Food in the Last Year: Sometimes true  Transportation Needs: No  Transportation Needs (04/13/2023)   PRAPARE - Administrator, Civil Service (Medical): No    Lack of Transportation (Non-Medical): No  Physical Activity: Insufficiently Active (11/08/2019)   Exercise Vital Sign    Days of Exercise per Week: 3 days    Minutes of Exercise per Session: 30 min  Stress: Stress Concern Present (08/28/2019)   [employer] of Occupational Health - Occupational Stress Questionnaire    Feeling of Stress : Very much  Social Connections: Socially Isolated (03/28/2023)   Social Connection and Isolation Panel [NHANES]    Frequency of Communication with Friends and Family: Three times a week    Frequency of Social Gatherings with Friends and Family: Twice a week    Attends Religious Services: Never    Database administrator or Organizations: No    Attends Engineer, structural: Never    Marital Status: Divorced   Past Medical History:  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 06/15/2018   Cancer of skin of neck    Cataract    Chronic mesenteric ischemia ([name])    Chronic pain syndrome 07/09/2016   Crohn's disease of small and large intestines ([name])    followed by dr Baldo Ash gessner   Dairy product intolerance    Diarrhea, functional    Family history of adverse reaction to anesthesia    Grover's disease    transient acantholytic dermatosis   History of alcohol abuse    History of basal cell carcinoma excision    2013 left leg   History of Clostridium difficile    10/ 2014   History of multiple concussions    x6   last one Jan 2017 per pt--  no residual   History of substance abuse ([name])    quit 1997 per pt   History of suicide attempt    05-18-2012  overdose /  failure to thrive   Iron deficiency anemia  due to chronic blood loss    Major depression, recurrent, chronic ([name])    Osteopenia    Personal history of adenomatous colonic polyps 12/2010, 03/2012   12/2010 - 8 mm serrated adenoma of rectum   Portal vein thrombosis 03/21/2015   right   Primary sclerosing cholangitis    ? 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   Substance abuse ([name]) 1997   Alcohol   Vitamin A deficiency 05/23/2018   Vitamin B6 deficiency 07/27/2018  Vitamin C deficiency 05/23/2018    Past Surgical History:  Procedure Laterality Date   ABDOMINAL AORTAGRAM N/A 03/29/2012   Procedure: ABDOMINAL AORTAGRAM;  Surgeon: Nada Libman, MD;  Location: [facility] CATH LAB;  Service: Cardiovascular;  Laterality: N/A;   ADENOIDECTOMY  age 49   BIOPSY  05/31/2018   Procedure: BIOPSY;  Surgeon: Rachael Fee, MD;  Location: [facility] ENDOSCOPY;  Service: Endoscopy;;   CATARACT EXTRACTION W/ INTRAOCULAR LENS  IMPLANT, BILATERAL  2009   COLONOSCOPY  2001, 05/02/2003, 01/28/11   2012: Right colon Crohn's, rectal polyp   COLONOSCOPY  03/31/2012   Procedure: COLONOSCOPY;  Surgeon: Beverley Fiedler, MD;  Location: [facility] ENDOSCOPY;  Service: Gastroenterology;  Laterality: N/A;   COLONOSCOPY WITH PROPOFOL N/A 05/31/2018   Procedure: COLONOSCOPY WITH PROPOFOL;  Surgeon: Rachael Fee, MD;  Location: [facility] ENDOSCOPY;  Service: Endoscopy;  Laterality: N/A;   ESOPHAGOGASTRODUODENOSCOPY  01/28/2011   Normal   FOOT SURGERY Right age 89   MOHS SURGERY Left 11/2013   left ankle parakerotosis    PERCUTANEOUS LIVER BIOPSY  2007 and 2008   PILONIDAL CYST EXCISION  age 27   PLACEMENT OF SETON N/A 11/06/2015   Procedure: PLACEMENT OF SETON;  Surgeon: Romie Levee, MD;  Location: [facility] [place];  Service: General;  Laterality: N/A;   PLACEMENT OF SETON  07/2018   at [facility]   RECTAL EXAM UNDER ANESTHESIA N/A 01/18/2019   Procedure: ANAL EXAM UNDER ANESTHESIA,  INCISION AND  DRAINAGE, SETON PLACEMENT;  Surgeon: Romie Levee, MD;  Location: [facility];  Service: General;  Laterality: N/A;   UPPER GASTROINTESTINAL ENDOSCOPY      Current Medications: Current Facility-Administered Medications  Medication Dose Route Frequency Provider Last Rate Last Admin   acetaminophen (TYLENOL) tablet 650 mg  650 mg Oral Q6H PRN Motley-Mangrum, Jadeka A, PMHNP       alum & mag hydroxide-simeth (MAALOX/MYLANTA) 200-200-20 MG/5ML suspension 30 mL  30 mL Oral Q4H PRN Motley-Mangrum, Jadeka A, PMHNP       dicyclomine (BENTYL) capsule 10 mg  10 mg Oral TID AC Motley-Mangrum, Jadeka A, PMHNP   10 mg at 04/19/23 1141   doxycycline (VIBRA-TABS) tablet 100 mg  100 mg Oral Q12H Floydene Flock, MD   100 mg at 04/19/23 1610   feeding supplement (ENSURE ENLIVE / ENSURE PLUS) liquid 237 mL  1 Bottle Oral TID BM Verner Chol, MD   237 mL at 04/19/23 1143   hydrOXYzine (ATARAX) tablet 25 mg  25 mg Oral TID PRN Motley-Mangrum, Ezra Sites, PMHNP   25 mg at 04/18/23 2032   LORazepam (ATIVAN) injection 1 mg  1 mg Intramuscular Q4H PRN Remington, Amber E, NP       LORazepam (ATIVAN) injection 2 mg  2 mg Intramuscular TID PRN Motley-Mangrum, Jadeka A, PMHNP       LORazepam (ATIVAN) tablet 1 mg  1 mg Oral Q4H PRN Remington, Amber E, NP   1 mg at 04/19/23 9604   magnesium hydroxide (MILK OF MAGNESIA) suspension 30 mL  30 mL Oral Daily PRN Motley-Mangrum, Jadeka A, PMHNP       multivitamin with minerals tablet 1 tablet  1 tablet Oral Daily Verner Chol, MD   1 tablet at 04/19/23 0834   ondansetron (ZOFRAN) injection 4 mg  4 mg Intravenous Q6H PRN Verner Chol, MD   4 mg at 04/16/23 0905   ondansetron (ZOFRAN-ODT) disintegrating tablet 4 mg  4 mg Oral Q8H PRN Verner Chol, MD  predniSONE (DELTASONE) tablet 30 mg  30 mg Oral Q breakfast Motley-Mangrum, Jadeka A, PMHNP   30 mg at 04/19/23 7253    Lab Results:  No results found. However, due to the size of the patient record,  not all encounters were searched. Please check Results Review for a complete set of results.   Blood Alcohol level:  Lab Results  Component Value Date   ETH <10 04/13/2023   ETH <10 03/28/2023    Metabolic Disorder Labs: Lab Results  Component Value Date   HGBA1C 4.8 04/03/2023   MPG 91.06 04/03/2023   No results found for: "PROLACTIN" Lab Results  Component Value Date   CHOL 209 (H) 04/03/2023   TRIG 75 04/03/2023   HDL 65 04/03/2023   CHOLHDL 3.2 04/03/2023   VLDL 15 04/03/2023   LDLCALC 129 (H) 04/03/2023      Psychiatric Specialty Exam:  Presentation  General Appearance:  Appropriate for Environment; Disheveled  Eye Contact: Fair  Speech: Clear and Coherent  Speech Volume: Decreased    Mood and Affect  Mood: Depressed; Anxious; Hopeless  Affect: Depressed; Flat   Thought Process  Thought Processes: Coherent  Descriptions of Associations:Intact  Orientation:Full (Time, Place and Person)  Thought Content:Illogical  Hallucinations:Denies  Ideas of Reference:None  Suicidal Thoughts:Positive SI  Homicidal Thoughts:Denies   Sensorium  Memory: Immediate Fair; Recent Fair; Remote Fair  Judgment: Impaired  Insight: Shallow   Executive Functions  Concentration: Poor  Attention Span: Poor  Recall: Fair  Fund of Knowledge: Fair  Language: Fair   Psychomotor Activity  Psychomotor Activity: Decreased  Musculoskeletal: Strength & Muscle Tone: decreased Gait & Station: unsteady uses walker Assets  Assets: Manufacturing systems engineer; Desire for Improvement; Physical Health    Physical Exam: Physical Exam Vitals and nursing note reviewed.  HENT:     Head: Normocephalic.     Nose: Nose normal.     Mouth/Throat:     Mouth: Mucous membranes are moist.  Eyes:     Pupils: Pupils are equal, round, and reactive to light.  Pulmonary:     Breath sounds: Normal breath sounds.  Abdominal:     General: Bowel sounds are  normal.  Neurological:     General: No focal deficit present.     Mental Status: He is alert.    Review of Systems  Constitutional:  Positive for malaise/fatigue.  HENT: Negative.    Eyes: Negative.   Cardiovascular: Negative.   Gastrointestinal: Negative.   Skin: Negative.   Blood pressure 112/77, pulse 83, temperature 98.1 F (36.7 C), resp. rate 16, height 6\' 1"  (1.854 m), weight 60.6 kg, SpO2 97%. Body mass index is 17.61 kg/m.  Diagnosis: Principal Problem:   MDD (major depressive disorder), recurrent severe, without psychosis ([name]) Active Problems:   GAD (generalized anxiety disorder)   Tremor   PLAN: Safety and Monitoring:  -- Involuntary admission to inpatient psychiatric unit for safety, stabilization and treatment  -- Daily contact with patient to assess and evaluate symptoms and progress in treatment  -- Patient's case to be discussed in multi-disciplinary team meeting  -- Observation Level : q15 minute checks  -- Vital signs:  q12 hours  -- Precautions: suicide, elopement, and assault -- Encouraged patient to participate in unit milieu and in scheduled group therapies  2. Psychiatric Diagnoses and Treatment:  Once patient is more medically stable can consider adding Effexor 75 mg to help with the anxiety/depression and some Abilify for mood stabilization given the intensity of depression  Hold  serotonergic and QT prolonging medication  (Lexapro, Buspar, Remeron)                     - QTc: 471 (04/15/23)     3. Medical Issues Being Addressed:   Working out on serotonin syndrome  4. Discharge Planning:   -- Social work and case management to assist with discharge planning and identification of hospital follow-up needs prior to discharge  -- Discharge date: To be determined  Lewanda Rife, MD

## 2023-04-19 NOTE — Plan of Care (Signed)
 Marland Kitchen

## 2023-04-19 NOTE — Progress Notes (Signed)
   04/19/23 1500  Psych Admission Type (Psych Patients Only)  Admission Status Involuntary  Psychosocial Assessment  Patient Complaints Sadness  Eye Contact Brief  Facial Expression Flat  Affect Sad  Speech Soft  Interaction Minimal  Motor Activity Slow  Appearance/Hygiene In scrubs  Behavior Characteristics Cooperative  Mood Sad  Thought Process  Coherency WDL  Content WDL  Delusions None reported or observed  Perception WDL  Hallucination None reported or observed  Judgment Impaired  Confusion None  Danger to Self  Current suicidal ideation? Passive  Description of Suicide Plan no plan  Self-Injurious Behavior No self-injurious ideation or behavior indicators observed or expressed   Agreement Not to Harm Self Yes  Description of Agreement verbal  Danger to Others  Danger to Others None reported or observed   Continues on Telesitter monitoring. Assisted with ADLs

## 2023-04-19 NOTE — Progress Notes (Signed)
 During safety rounds, pt noticed to be shaking in bed. Spoke with patient. "I'm worried about what will happen to me, where I will go when I leave here." Pt states he spoke to his parents and that both parties believe it is better that he search for alternative living arrangements. "I'm gonna look for assisted living." Pt encouraged to speak to social worker about his needs. Also encouraged pt to be more active each day as a way to increase his ability to perform ADLs and mobility with greater independence to prepare for acceptance to such a facility. Pt given PRN medication as appropriate. Will continue to monitor.

## 2023-04-19 NOTE — Progress Notes (Addendum)
   04/18/23 1935  Psych Admission Type (Psych Patients Only)  Admission Status Involuntary  Psychosocial Assessment  Patient Complaints Self-harm thoughts;Anxiety;Depression  Eye Contact Brief  Facial Expression Flat  Affect Depressed  Speech Soft  Interaction Minimal  Motor Activity Slow  Appearance/Hygiene Disheveled  Behavior Characteristics Cooperative;Anxious  Mood Depressed  Thought Process  Coherency WDL  Content WDL  Delusions None reported or observed  Perception WDL  Hallucination None reported or observed  Judgment Impaired  Confusion None  Danger to Self  Current suicidal ideation? Passive  Description of Suicide Plan no plan  Self-Injurious Behavior Some self-injurious ideation observed or expressed.  No lethal plan expressed   Agreement Not to Harm Self Yes  Description of Agreement verbal  Danger to Others  Danger to Others None reported or observed   Progress note   D: Pt seen in his room with tele-sitter. Pt denies HI, AVH. Endorses passive SI without a plan. Contracts for safety.  Pt rates pain  0/10. Pt rates anxiety  10/10 and depression 10/10. Pt states he did go to meals today. Spends most of his time in his room. Assisted pt to bathroom. VS assessed and wnl. Pt eating and drinking well. Able to turn himself from side to side. Wound assessed and no drainage noted. Pt states he spoke with his parents today and that they are glad he is somewhere getting help. Pt still depressed affect. Did not attend group tonight. No other concerns noted at this time.  A: Pt provided support and encouragement. Pt given scheduled medication as prescribed. PRNs as appropriate. Q15 min checks for safety.   R: Pt safe on the unit. Will continue to monitor.

## 2023-04-19 NOTE — Group Note (Signed)
 Recreation Therapy Group Note   Group Topic:Stress Management  Group Date: 04/19/2023 Start Time: 1100 End Time: 1130 Facilitators: Rosina Lowenstein, LRT, CTRS Location:  Dayroom  Group Description: Meditation. LRT and patients discussed what they know about meditation and mindfulness. LRT played a Deep Breathing Meditation exercise script for patients to follow along to. LRT and patients discussed how meditation and deep breathing can be used as a coping skill post--discharge to help manage symptoms of stress.   Goal Area(s) Addressed: Patient will practice using relaxation technique. Patient will identify a new coping skill.  Patient will follow multistep directions to reduce anxiety and stress.   Affect/Mood: N/A   Participation Level: Did not attend    Clinical Observations/Individualized Feedback: Patient did not attend group.   Plan: Continue to engage patient in RT group sessions 2-3x/week.   Rosina Lowenstein, LRT, CTRS 04/19/2023 1:20 PM

## 2023-04-19 NOTE — Plan of Care (Signed)
  Problem: Education: Goal: Emotional status will improve Outcome: Progressing   Problem: Activity: Goal: Interest or engagement in activities will improve Outcome: Progressing Goal: Sleeping patterns will improve Outcome: Progressing   Problem: Health Behavior/Discharge Planning: Goal: Compliance with treatment plan for underlying cause of condition will improve Reactivated   Problem: Physical Regulation: Goal: Ability to maintain clinical measurements within normal limits will improve Outcome: Progressing   Problem: Safety: Goal: Periods of time without injury will increase Outcome: Progressing

## 2023-04-19 NOTE — Group Note (Signed)
 Recreation Therapy Group Note   Group Topic:Emotion Expression  Group Date: 04/19/2023 Start Time: 1500 End Time: 1600 Facilitators: Rosina Lowenstein, LRT, CTRS Location:  Dayroom  Group Description: Painting a Diplomatic Services operational officer. Patients and LRT discuss what it means to be "at peace", what it feels like physically and mentally. Pts are given a canvas and watercolor paint to use and encouraged to draw their idea of a peaceful place. Pts and LRT discuss how they use this in their daily life post discharge. Pts are encouraged to take their canvas home with them as a reminder to find their peaceful place whenever they are feeling depressed, anxious, etc.    Goal Area(s) Addressed:  Patient will identify what it means to experience a "peaceful" emotion. Patient will identify a new coping skill.  Patient will express their emotions through art. Patients will increase communication by talking with LRT and peers while in group.   Affect/Mood: N/A   Participation Level: Did not attend    Clinical Observations/Individualized Feedback: Patient did not attend group.   Plan: Continue to engage patient in RT group sessions 2-3x/week.   Rosina Lowenstein, LRT, CTRS 04/19/2023 4:29 PM

## 2023-04-19 NOTE — Group Note (Signed)
 [facility] LCSW Group Therapy Note    Group Date: 04/19/2023 Start Time: 1300 End Time: 1400  Type of Therapy and Topic:  Group Therapy:  Overcoming Obstacles  Participation Level:  [facility] PARTICIPATION LEVEL: Minimal  Mood:  Description of Group:   In this group patients will be encouraged to explore what they see as obstacles to their own wellness and recovery. They will be guided to discuss their thoughts, feelings, and behaviors related to these obstacles. The group will process together ways to cope with barriers, with attention given to specific choices patients can make. Each patient will be challenged to identify changes they are motivated to make in order to overcome their obstacles. This group will be process-oriented, with patients participating in exploration of their own experiences as well as giving and receiving support and challenge from other group members.  Therapeutic Goals: 1. Patient will identify personal and current obstacles as they relate to admission. 2. Patient will identify barriers that currently interfere with their wellness or overcoming obstacles.  3. Patient will identify feelings, thought process and behaviors related to these barriers. 4. Patient will identify two changes they are willing to make to overcome these obstacles:    Summary of Patient Progress   Pt participated during group with minimal insight into topic. Pt shared that he was concerned about his living situation and that he was very anxious about it.    Therapeutic Modalities:   Cognitive Behavioral Therapy Solution Focused Therapy Motivational Interviewing Relapse Prevention Therapy   Elza Rafter, LCSWA

## 2023-04-20 DIAGNOSIS — F332 Major depressive disorder, recurrent severe without psychotic features: Secondary | ICD-10-CM | POA: Diagnosis not present

## 2023-04-20 LAB — COMPREHENSIVE METABOLIC PANEL
ALT: 39 U/L (ref 0–44)
AST: 35 U/L (ref 15–41)
Albumin: 2.4 g/dL — ABNORMAL LOW (ref 3.5–5.0)
Alkaline Phosphatase: 87 U/L (ref 38–126)
Anion gap: 8 (ref 5–15)
BUN: 36 mg/dL — ABNORMAL HIGH (ref 6–20)
CO2: 27 mmol/L (ref 22–32)
Calcium: 8.9 mg/dL (ref 8.9–10.3)
Chloride: 105 mmol/L (ref 98–111)
Creatinine, Ser: 1.03 mg/dL (ref 0.61–1.24)
GFR, Estimated: >60 mL/min (ref >60)
Glucose, Bld: 129 mg/dL — ABNORMAL HIGH (ref 70–99)
Potassium: 4 mmol/L (ref 3.5–5.1)
Sodium: 140 mmol/L (ref 135–145)
Total Bilirubin: 0.4 mg/dL (ref 0.0–1.2)
Total Protein: 4.8 g/dL — ABNORMAL LOW (ref 6.5–8.1)

## 2023-04-20 NOTE — Progress Notes (Signed)
 [facility] MD Progress Note  04/20/2023 [name]  MRN:  [id number]  Jaliel Deavers is a 58 year old Caucasian male with a history of MDD, anxiety, Crohn's disease, and chronic mesenteric ischemia who presented to [facility] emergency department on 04/13/23 at 0404 for suicide attempt by ingestion of 28 tablets of 10mg  Lexapro at 0200. Per ED documentation, patient had some mild symptoms of serotonin syndrome on arrival, including acute agitation and clonus, with symptomatic improvement with benzodiazepines. Did have a return of symptoms requiring additional dose of lorazepam. Poison control advised the total amount of ingested Lexapro likely is not enough to cause serotonin syndrome by itself and they recommended 8-hour cardiac monitoring. Qtc 498.  Subjective:  Chart reviewed, case discussed in multidisciplinary meeting, patient seen during rounds. Patient reportedly overdosed on Lexapro and admitted due to serotonin syndrome.  Today during assessment patient endorsed depressed mood, he feels helpless and hopeless.  Not much change in mental status since yesterday.  Patient has a Cabin crew in place due to fall risk.  Patient continues to report suicidal thoughts, denies any intention to harm himself on the unit.  Patient denies psychotic or manic symptoms.  Patient was encouraged to attend group and work on coping strategies.    Sleep: Fair  Appetite:  Fair  Past Psychiatric History: see h&P Family History:  Family History  Problem Relation Age of Onset   Heart disease Father    Thyroid cancer Father    Allergies Father    Clotting disorder Father    Breast cancer Mother    Stomach cancer Mother    Colon cancer Neg Hx    Esophageal cancer Neg Hx    Rectal cancer Neg Hx    Social History:  Social History   Substance and Sexual Activity  Alcohol Use No   Alcohol/week: 0.0 standard drinks of alcohol     Social History   Substance and Sexual Activity  Drug Use Not Currently    Types: Cocaine, Marijuana   Comment: QUIT USING DRUGS IN 1997    Social History   Socioeconomic History   Marital status: Single    Spouse name: Not on file   Number of children: 1   Years of education: Not on file   Highest education level: Not on file  Occupational History   Occupation: Disability   Tobacco Use   Smoking status: Former    Current packs/day: 0.00    Average packs/day: 1 pack/day for 15.0 years (15.0 ttl pk-yrs)    Types: Cigarettes    Start date: 03/28/1982    Quit date: 03/28/1997    Years since quitting: 26.0   Smokeless tobacco: Never  Vaping Use   Vaping status: Never Used  Substance and Sexual Activity   Alcohol use: No    Alcohol/week: 0.0 standard drinks of alcohol   Drug use: Not Currently    Types: Cocaine, Marijuana    Comment: QUIT USING DRUGS IN 1997   Sexual activity: Not Currently  Other Topics Concern   Not on file  Social History Narrative   Single, history of substance abuse in recovery   Previously occupied on medical disability   1 child  Daughter Wellsite geologist, lives and works in [place]   Elderly parents involved and he helps them   1 brother   Social Drivers of Corporate investment banker Strain: Low Risk  (08/28/2019)   Overall Financial Resource Strain (CARDIA)    Difficulty of Paying Living Expenses: Not hard at  all  Food Insecurity: Food Insecurity Present (04/13/2023)   Hunger Vital Sign    Worried About Running Out of Food in the Last Year: Never true    Ran Out of Food in the Last Year: Sometimes true  Transportation Needs: No Transportation Needs (04/13/2023)   PRAPARE - Administrator, Civil Service (Medical): No    Lack of Transportation (Non-Medical): No  Physical Activity: Insufficiently Active (11/08/2019)   Exercise Vital Sign    Days of Exercise per Week: 3 days    Minutes of Exercise per Session: 30 min  Stress: Stress Concern Present (08/28/2019)   [employer] of Occupational Health -  Occupational Stress Questionnaire    Feeling of Stress : Very much  Social Connections: Socially Isolated (03/28/2023)   Social Connection and Isolation Panel [NHANES]    Frequency of Communication with Friends and Family: Three times a week    Frequency of Social Gatherings with Friends and Family: Twice a week    Attends Religious Services: Never    Database administrator or Organizations: No    Attends Engineer, structural: Never    Marital Status: Divorced   Past Medical History:  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 06/15/2018   Cancer of skin of neck    Cataract    Chronic mesenteric ischemia ([name])    Chronic pain syndrome 07/09/2016   Crohn's disease of small and large intestines ([name])    followed by dr Baldo Ash gessner   Dairy product intolerance    Diarrhea, functional    Family history of adverse reaction to anesthesia    Grover's disease    transient acantholytic dermatosis   History of alcohol abuse    History of basal cell carcinoma excision    2013 left leg   History of Clostridium difficile    10/ 2014   History of multiple concussions    x6   last one Jan 2017 per pt--  no residual   History of substance abuse ([name])    quit 1997 per pt   History of suicide attempt    05-18-2012  overdose /  failure to thrive   Iron deficiency anemia due to chronic blood loss    Major depression, recurrent, chronic ([name])    Osteopenia    Personal history of adenomatous colonic polyps 12/2010, 03/2012   12/2010 - 8 mm serrated adenoma of rectum   Portal vein thrombosis 03/21/2015   right   Primary sclerosing cholangitis    ? 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    Substance abuse ([name]) 1997   Alcohol   Vitamin A deficiency 05/23/2018   Vitamin B6 deficiency 07/27/2018   Vitamin C deficiency 05/23/2018    Past Surgical History:  Procedure Laterality Date   ABDOMINAL AORTAGRAM N/A 03/29/2012   Procedure: ABDOMINAL Ronny Flurry;  Surgeon: Nada Libman, MD;  Location[facility] CATH LAB;  Service: Cardiovascular;  Laterality: N/A;   ADENOIDECTOMY  age 65   BIOPSY  05/31/2018   Procedure: BIOPSY;  Surgeon: Rachael Fee, MD;  Location: [facility] ENDOSCOPY;  Service: Endoscopy;;   CATARACT  EXTRACTION W/ INTRAOCULAR LENS  IMPLANT, BILATERAL  2009   COLONOSCOPY  2001, 05/02/2003, 01/28/11   2012: Right colon Crohn's, rectal polyp   COLONOSCOPY  03/31/2012   Procedure: COLONOSCOPY;  Surgeon: Beverley Fiedler, MD;  Location: [facility] ENDOSCOPY;  Service: Gastroenterology;  Laterality: N/A;   COLONOSCOPY WITH PROPOFOL N/A 05/31/2018   Procedure: COLONOSCOPY WITH PROPOFOL;  Surgeon: Rachael Fee, MD;  Location: [facility] ENDOSCOPY;  Service: Endoscopy;  Laterality: N/A;   ESOPHAGOGASTRODUODENOSCOPY  01/28/2011   Normal   FOOT SURGERY Right age 37   MOHS SURGERY Left 11/2013   left ankle parakerotosis    PERCUTANEOUS LIVER BIOPSY  2007 and 2008   PILONIDAL CYST EXCISION  age 38   PLACEMENT OF SETON N/A 11/06/2015   Procedure: PLACEMENT OF SETON;  Surgeon: Romie Levee, MD;  Location: [facility];  Service: General;  Laterality: N/A;   PLACEMENT OF SETON  07/2018   at [facility]   RECTAL EXAM UNDER ANESTHESIA N/A 01/18/2019   Procedure: ANAL EXAM UNDER ANESTHESIA,  INCISION AND DRAINAGE, SETON PLACEMENT;  Surgeon: Romie Levee, MD;  Location: [facility];  Service: General;  Laterality: N/A;   UPPER GASTROINTESTINAL ENDOSCOPY      Current Medications: Current Facility-Administered Medications  Medication Dose Route Frequency Provider Last Rate Last Admin   acetaminophen (TYLENOL) tablet 650 mg  650 mg Oral Q6H PRN Motley-Mangrum, Jadeka A, PMHNP        alum & mag hydroxide-simeth (MAALOX/MYLANTA) 200-200-20 MG/5ML suspension 30 mL  30 mL Oral Q4H PRN Motley-Mangrum, Jadeka A, PMHNP       dicyclomine (BENTYL) capsule 10 mg  10 mg Oral TID AC Motley-Mangrum, Jadeka A, PMHNP   10 mg at 04/20/23 1739   doxycycline (VIBRA-TABS) tablet 100 mg  100 mg Oral Q12H Floydene Flock, MD   100 mg at 04/20/23 0840   feeding supplement (ENSURE ENLIVE / ENSURE PLUS) liquid 237 mL  1 Bottle Oral TID BM Verner Chol, MD   237 mL at 04/20/23 1400   hydrOXYzine (ATARAX) tablet 25 mg  25 mg Oral TID PRN Motley-Mangrum, Geralynn Ochs A, PMHNP   25 mg at 04/20/23 1746   LORazepam (ATIVAN) injection 1 mg  1 mg Intramuscular Q4H PRN Remington, Amber E, NP       LORazepam (ATIVAN) injection 2 mg  2 mg Intramuscular TID PRN Motley-Mangrum, Jadeka A, PMHNP       LORazepam (ATIVAN) tablet 1 mg  1 mg Oral Q4H PRN Remington, Amber E, NP   1 mg at 04/19/23 1610   magnesium hydroxide (MILK OF MAGNESIA) suspension 30 mL  30 mL Oral Daily PRN Motley-Mangrum, Jadeka A, PMHNP       multivitamin with minerals tablet 1 tablet  1 tablet Oral Daily Verner Chol, MD   1 tablet at 04/20/23 0840   ondansetron (ZOFRAN) injection 4 mg  4 mg Intravenous Q6H PRN Verner Chol, MD   4 mg at 04/16/23 0905   ondansetron (ZOFRAN-ODT) disintegrating tablet 4 mg  4 mg Oral Q8H PRN Verner Chol, MD       predniSONE (DELTASONE) tablet 30 mg  30 mg Oral Q breakfast Motley-Mangrum, Jadeka A, PMHNP   30 mg at 04/20/23 0840    Lab Results:  Results for orders placed or performed during the hospital encounter of 04/13/23 (from the past 48 hours)  Comprehensive metabolic panel     Status: Abnormal   Collection Time: 04/20/23  2:54 PM  Result Value Ref  Range   Sodium 140 135 - 145 mmol/L   Potassium 4.0 3.5 - 5.1 mmol/L   Chloride 105 98 - 111 mmol/L   CO2 27 22 - 32 mmol/L   Glucose, Bld 129 (H) 70 - 99 mg/dL    Comment: Glucose reference range applies only to samples taken after fasting for  at least 8 hours.   BUN 36 (H) 6 - 20 mg/dL   Creatinine, Ser 9.60 0.61 - 1.24 mg/dL   Calcium 8.9 8.9 - 45.4 mg/dL   Total Protein 4.8 (L) 6.5 - 8.1 g/dL   Albumin 2.4 (L) 3.5 - 5.0 g/dL   AST 35 15 - 41 U/L   ALT 39 0 - 44 U/L   Alkaline Phosphatase 87 38 - 126 U/L   Total Bilirubin 0.4 0.0 - 1.2 mg/dL   GFR, Estimated >09 >81 mL/min    Comment: (NOTE) Calculated using the CKD-EPI Creatinine Equation (2021)    Anion gap 8 5 - 15    Comment: Performed at [facility], [address], [place], [place] [address]   *Note: Due to a large number of results and/or encounters for the requested time period, some results have not been displayed. A complete set of results can be found in Results Review.     Blood Alcohol level:  Lab Results  Component Value Date   ETH <10 04/13/2023   ETH <10 03/28/2023    Metabolic Disorder Labs: Lab Results  Component Value Date   HGBA1C 4.8 04/03/2023   MPG 91.06 04/03/2023   No results found for: "PROLACTIN" Lab Results  Component Value Date   CHOL 209 (H) 04/03/2023   TRIG 75 04/03/2023   HDL 65 04/03/2023   CHOLHDL 3.2 04/03/2023   VLDL 15 04/03/2023   LDLCALC 129 (H) 04/03/2023      Psychiatric Specialty Exam:  Presentation  General Appearance:  Appropriate for Environment; Disheveled  Eye Contact: Fair  Speech: Clear and Coherent  Speech Volume: Decreased    Mood and Affect  Mood: Depressed; Anxious; Hopeless  Affect: Depressed; Flat   Thought Process  Thought Processes: Coherent  Descriptions of Associations:Intact  Orientation:Full (Time, Place and Person)  Thought Content:Illogical  Hallucinations:Denies  Ideas of Reference:None  Suicidal Thoughts:Positive SI  Homicidal Thoughts:Denies   Sensorium  Memory: Immediate Fair; Recent Fair; Remote Fair  Judgment: Impaired  Insight: Shallow   Executive Functions  Concentration: Poor  Attention  Span: Poor  Recall: Fair  Fund of Knowledge: Fair  Language: Fair   Psychomotor Activity  Psychomotor Activity: Decreased  Musculoskeletal: Strength & Muscle Tone: decreased Gait & Station: unsteady uses walker Assets  Assets: Manufacturing systems engineer; Desire for Improvement; Physical Health    Physical Exam: Physical Exam Vitals and nursing note reviewed.  HENT:     Head: Normocephalic.     Nose: Nose normal.     Mouth/Throat:     Mouth: Mucous membranes are moist.  Eyes:     Pupils: Pupils are equal, round, and reactive to light.  Pulmonary:     Breath sounds: Normal breath sounds.  Skin:    General: Skin is warm.  Neurological:     General: No focal deficit present.     Mental Status: He is alert.    Review of Systems  Constitutional:  Positive for malaise/fatigue.  HENT: Negative.    Eyes: Negative.   Cardiovascular: Negative.   Gastrointestinal: Negative.   Blood pressure 139/77, pulse 73, temperature (!) 97.3 F (36.3 C), resp.  rate 14, height 6\' 1"  (1.854 m), weight 60.6 kg, SpO2 100%. Body mass index is 17.61 kg/m.  Diagnosis: Principal Problem:   MDD (major depressive disorder), recurrent severe, without psychosis ([name]) Active Problems:   GAD (generalized anxiety disorder)   Tremor   PLAN: Safety and Monitoring:  -- Involuntary admission to inpatient psychiatric unit for safety, stabilization and treatment  -- Daily contact with patient to assess and evaluate symptoms and progress in treatment  -- Patient's case to be discussed in multi-disciplinary team meeting  -- Observation Level : q15 minute checks  -- Vital signs:  q12 hours  -- Precautions: suicide, elopement, and assault -- Encouraged patient to participate in unit milieu and in scheduled group therapies  2. Psychiatric Diagnoses and Treatment:  Once patient is more medically stable can consider adding Effexor 75 mg to help with the anxiety/depression and some Abilify for mood  stabilization given the intensity of depression  Hold serotonergic and QT prolonging medication  (Lexapro, Buspar, Remeron)                     - QTc: 471 (04/15/23)     3. Medical Issues Being Addressed:   CMP and EKG ordered  4. Discharge Planning:   -- Social work and case management to assist with discharge planning and identification of hospital follow-up needs prior to discharge  -- Discharge date: To be determined  Lewanda Rife, MD

## 2023-04-20 NOTE — BH IP Treatment Plan (Addendum)
 Interdisciplinary Treatment and Diagnostic Plan Update  04/20/2023 Time of Session: 11:00 AM  [name] MRN: [id number]  Principal Diagnosis: MDD (major depressive disorder), recurrent severe, without psychosis ([name])  Secondary Diagnoses: Principal Problem:   MDD (major depressive disorder), recurrent severe, without psychosis ([name]) Active Problems:   GAD (generalized anxiety disorder)   Tremor   Current Medications:  Current Facility-Administered Medications  Medication Dose Route Frequency Provider Last Rate Last Admin   acetaminophen (TYLENOL) tablet 650 mg  650 mg Oral Q6H PRN Motley-Mangrum, Jadeka A, PMHNP       alum & mag hydroxide-simeth (MAALOX/MYLANTA) 200-200-20 MG/5ML suspension 30 mL  30 mL Oral Q4H PRN Motley-Mangrum, Jadeka A, PMHNP       dicyclomine (BENTYL) capsule 10 mg  10 mg Oral TID AC Motley-Mangrum, Jadeka A, PMHNP   10 mg at 04/20/23 1240   doxycycline (VIBRA-TABS) tablet 100 mg  100 mg Oral Q12H Floydene Flock, MD   100 mg at 04/20/23 0840   feeding supplement (ENSURE ENLIVE / ENSURE PLUS) liquid 237 mL  1 Bottle Oral TID BM Verner Chol, MD   237 mL at 04/20/23 0948   hydrOXYzine (ATARAX) tablet 25 mg  25 mg Oral TID PRN Motley-Mangrum, Jadeka A, PMHNP   25 mg at 04/20/23 0609   LORazepam (ATIVAN) injection 1 mg  1 mg Intramuscular Q4H PRN Remington, Amber E, NP       LORazepam (ATIVAN) injection 2 mg  2 mg Intramuscular TID PRN Motley-Mangrum, Jadeka A, PMHNP       LORazepam (ATIVAN) tablet 1 mg  1 mg Oral Q4H PRN Melanie Crazier, Amber E, NP   1 mg at 04/19/23 4098   magnesium hydroxide (MILK OF MAGNESIA) suspension 30 mL  30 mL Oral Daily PRN Motley-Mangrum, Jadeka A, PMHNP       multivitamin with minerals tablet 1 tablet  1 tablet Oral Daily Verner Chol, MD   1 tablet at 04/20/23 0840   ondansetron (ZOFRAN) injection 4 mg  4 mg Intravenous Q6H PRN Verner Chol, MD   4 mg at 04/16/23 0905   ondansetron (ZOFRAN-ODT) disintegrating tablet 4 mg   4 mg Oral Q8H PRN Verner Chol, MD       predniSONE (DELTASONE) tablet 30 mg  30 mg Oral Q breakfast Motley-Mangrum, Jadeka A, PMHNP   30 mg at 04/20/23 0840   PTA Medications: Medications Prior to Admission  Medication Sig Dispense Refill Last Dose/Taking   busPIRone (BUSPAR) 10 MG tablet Take 1 tablet (10 mg total) by mouth 2 (two) times daily. 30 tablet 0 04/12/2023   dicyclomine (BENTYL) 10 MG capsule Take 1 capsule (10 mg total) by mouth 3 (three) times daily before meals. 90 capsule 0 Taking   escitalopram (LEXAPRO) 10 MG tablet Take 1 tablet (10 mg total) by mouth at bedtime. 30 tablet 0 04/13/2023   ketoconazole (NIZORAL) 2 % cream Apply 1 Application topically daily.   Taking   LORazepam (ATIVAN) 0.5 MG tablet Take 0.5 mg by mouth daily.   Taking   mirtazapine (REMERON) 30 MG tablet Take 1 tablet (30 mg total) by mouth at bedtime. 30 tablet 0 04/11/2023   predniSONE (DELTASONE) 20 MG tablet Take 1.5 tablets (30 mg total) by mouth daily with breakfast. 100 tablet 2 Past Month    Patient Stressors:    Patient Strengths:    Treatment Modalities: Medication Management, Group therapy, Case management,  1 to 1 session with clinician, Psychoeducation, Recreational therapy.   Physician Treatment Plan for  Primary Diagnosis: MDD (major depressive disorder), recurrent severe, without psychosis ([name]) Long Term Goal(s):     Short Term Goals:    Medication Management: Evaluate patient's response, side effects, and tolerance of medication regimen.  Therapeutic Interventions: 1 to 1 sessions, Unit Group sessions and Medication administration.  Evaluation of Outcomes: Progressing  Physician Treatment Plan for Secondary Diagnosis: Principal Problem:   MDD (major depressive disorder), recurrent severe, without psychosis ([name]) Active Problems:   GAD (generalized anxiety disorder)   Tremor  Long Term Goal(s):     Short Term Goals:       Medication Management: Evaluate patient's  response, side effects, and tolerance of medication regimen.  Therapeutic Interventions: 1 to 1 sessions, Unit Group sessions and Medication administration.  Evaluation of Outcomes: Progressing   RN Treatment Plan for Primary Diagnosis: MDD (major depressive disorder), recurrent severe, without psychosis ([name]) Long Term Goal(s): Knowledge of disease and therapeutic regimen to maintain health will improve  Short Term Goals: Ability to remain free from injury will improve, Ability to verbalize frustration and anger appropriately will improve, Ability to demonstrate self-control, Ability to participate in decision making will improve, Ability to verbalize feelings will improve, Ability to disclose and discuss suicidal ideas, Ability to identify and develop effective coping behaviors will improve, and Compliance with prescribed medications will improve  Medication Management: RN will administer medications as ordered by provider, will assess and evaluate patient's response and provide education to patient for prescribed medication. RN will report any adverse and/or side effects to prescribing provider.  Therapeutic Interventions: 1 on 1 counseling sessions, Psychoeducation, Medication administration, Evaluate responses to treatment, Monitor vital signs and CBGs as ordered, Perform/monitor CIWA, COWS, AIMS and Fall Risk screenings as ordered, Perform wound care treatments as ordered.  Evaluation of Outcomes: Progressing   LCSW Treatment Plan for Primary Diagnosis: MDD (major depressive disorder), recurrent severe, without psychosis ([name]) Long Term Goal(s): Safe transition to appropriate next level of care at discharge, Engage patient in therapeutic group addressing interpersonal concerns.  Short Term Goals: Engage patient in aftercare planning with referrals and resources, Increase social support, Increase ability to appropriately verbalize feelings, Increase emotional regulation, Facilitate  acceptance of mental health diagnosis and concerns, Facilitate patient progression through stages of change regarding substance use diagnoses and concerns, Identify triggers associated with mental health/substance abuse issues, and Increase skills for wellness and recovery  Therapeutic Interventions: Assess for all discharge needs, 1 to 1 time with Social worker, Explore available resources and support systems, Assess for adequacy in community support network, Educate family and significant other(s) on suicide prevention, Complete Psychosocial Assessment, Interpersonal group therapy.  Evaluation of Outcomes: Progressing   Progress in Treatment: Attending groups: No. Participating in groups: No. Taking medication as prescribed: Yes. Toleration medication: Yes. Family/Significant other contact made: Yes, individual(s) contacted:  Feliberto Harts, brother  Patient understands diagnosis: Yes. Discussing patient identified problems/goals with staff: Yes. Medical problems stabilized or resolved: Yes. Denies suicidal/homicidal ideation: Yes. Issues/concerns per patient self-inventory: No. Other: None   New problem(s) identified: No, Describe:  None identified  Update 04/20/23: No changes at this time   New Short Term/Long Term Goal(s): elimination of symptoms of psychosis, medication management for mood stabilization; elimination of SI thoughts; development of comprehensive mental wellness plan. Update 04/20/23: No changes at this time   Patient Goals:  " I want to get control of my anxiety" Update 04/20/23: No changes at this time    Discharge Plan or Barriers: CSW to assist with appropriate discharge planning  Update 04/20/23: No changes at this time    Reason for Continuation of Hospitalization: Anxiety Depression Medication stabilization   Estimated Length of Stay: 1 to 7 days Update 04/20/23: TBD  Last 3 [place] Suicide Severity Risk Score: Flowsheet Row Admission (Current) from 04/13/2023  in [facility] BEHAVIORAL MEDICINE Most recent reading at 04/13/2023  7:45 PM ED from 04/13/2023 in [facility] Emergency Department at [facility] Most recent reading at 04/13/2023  4:16 AM ED from 04/11/2023 in [facility] Most recent reading at 04/11/2023  1:11 PM  C-SSRS RISK CATEGORY High Risk High Risk No Risk       Last PHQ 2/9 Scores:    06/06/2020   11:06 AM 03/06/2020    2:49 PM 08/28/2019    1:04 PM  Depression screen PHQ 2/9  Decreased Interest 0 0 0  Down, Depressed, Hopeless 0 1 1  PHQ - 2 Score 0 1 1  Altered sleeping 1 2 3   Tired, decreased energy 1 2 3   Change in appetite 1 1 1   Feeling bad or failure about yourself  0 0 0  Trouble concentrating 1 2 3   Moving slowly or fidgety/restless 1 0 0  Suicidal thoughts 0 0 0  PHQ-9 Score 5 8 11   Difficult doing work/chores   Somewhat difficult    Scribe for Treatment Team: Laretta Alstrom 04/20/2023 2:37 PM

## 2023-04-20 NOTE — Progress Notes (Signed)
   04/20/23 2300  Psych Admission Type (Psych Patients Only)  Admission Status Involuntary  Psychosocial Assessment  Patient Complaints Anxiety;Depression  Eye Contact Brief  Facial Expression Flat  Affect Sad  Speech Soft  Interaction Minimal  Motor Activity Slow  Appearance/Hygiene In scrubs  Behavior Characteristics Anxious  Mood Anxious;Depressed  Thought Process  Coherency WDL  Content WDL  Delusions None reported or observed  Perception WDL  Hallucination None reported or observed  Judgment Impaired  Confusion None  Danger to Self  Current suicidal ideation? Denies  Self-Injurious Behavior No self-injurious ideation or behavior indicators observed or expressed   Agreement Not to Harm Self Yes  Description of Agreement verbal  Danger to Others  Danger to Others None reported or observed

## 2023-04-20 NOTE — Progress Notes (Signed)
   04/19/23 2300  Psych Admission Type (Psych Patients Only)  Admission Status Involuntary  Psychosocial Assessment  Patient Complaints Depression  Eye Contact Brief  Facial Expression Flat  Affect Depressed  Speech Soft  Interaction Minimal  Motor Activity Slow  Appearance/Hygiene Disheveled  Behavior Characteristics Cooperative;Anxious  Mood Depressed  Thought Process  Coherency WDL  Content WDL  Delusions None reported or observed  Perception WDL  Hallucination None reported or observed  Judgment Impaired  Confusion None  Danger to Self  Current suicidal ideation? Passive  Self-Injurious Behavior No self-injurious ideation or behavior indicators observed or expressed   Agreement Not to Harm Self Yes  Description of Agreement verbal  Danger to Others  Danger to Others None reported or observed

## 2023-04-20 NOTE — Progress Notes (Signed)
 Physical Therapy Group Note  Group Topic:  Home Safety Modifications for Fall Prevention, Fall Recovery Technique Group Date: 04/20/2023 Group Time (start and end): 1610-9604 Facilitators:  Cephus Slater, PT; Elly Modena, PT  Group Description: Group discussed various home safety modifications to optimize safety, minimize fall risk in home environment.  Brainstormed and discussed opportunities throughout specific rooms/spaces of the home (entryway, kitchen, bathroom, bedroom and main living spaces), encouraging active participation with ideas and suggestions throughout session.  Provided handout with home safety checklist ([employer] on Aging) for reference outside of group time.  Encouraged each participant to voice a specific modification that he/she could implement at discharge to improve safety of his/her own home environment.  Additionally, reviewed strategy/technique and safety considerations for fall recovery.  Reviewed importance of calling emergency services as necessary and waiting for assistance to arrive if injury suspected.  If injury ruled out, did review fall recovery technique, transitioning from supine --> quadruped --> tall kneeling --> standing while using solid support surface for stabilization.  Therapist demonstrated technique, encouraging group participants to problem-solve and sequence correct technique.  Therapeutic Goal(s): Identify and discuss home safety modifications for primary living spaces of home environment. Identify and discuss fall recovery techniques.   Individual Participation:  Patient did not attend.   Participation Level and Quality: NA   Behavior: NA   Speech/Thought Process: NA   Affect/Mood: NA   Insight: NA   Judgement: NA   Modes of Intervention: Verbal discussion, demonstration, handout provided     Plan: Continue to engage patient in PT/OT groups 1-2x/week.  Ragan Duhon H. Manson Passey, PT, DPT, NCS 04/20/23, 5:09  PM [phone]

## 2023-04-20 NOTE — Plan of Care (Signed)
   Problem: Education: Goal: Knowledge of [name] Education information/materials will improve Outcome: Progressing Goal: Emotional status will improve Outcome: Progressing Goal: Mental status will improve Outcome: Progressing Goal: Verbalization of understanding the information provided will improve Outcome: Progressing   Problem: Activity: Goal: Interest or engagement in activities will improve Outcome: Progressing Goal: Sleeping patterns will improve Outcome: Progressing   Problem: Coping: Goal: Ability to verbalize frustrations and anger appropriately will improve Outcome: Progressing Goal: Ability to demonstrate self-control will improve Outcome: Progressing

## 2023-04-20 NOTE — Group Note (Signed)
 Recreation Therapy Group Note   Group Topic:Other  Group Date: 04/20/2023 Start Time: 1400 End Time: 1445 Facilitators: Rosina Lowenstein, LRT, CTRS Location:  Dayroom  Activity Description/Intervention: Therapeutic Drumming. Patients with peers and staff were given the opportunity to engage in a leader facilitated [facility] Empowerment Drumming Circle with staff from the [employer], in partnership with [employer]. Teaching laboratory technician and trained [employer], Theodoro Doing leading with LRT observing and documenting intervention and pt response. This evidenced-based practice targets 7 areas of health and wellbeing in the human experience including: stress-reduction, exercise, self-expression, camaraderie/support, nurturing, spirituality, and music-making (leisure).    Goal Area(s) Addresses:  Patient will engage in pro-social way in music group.  Patient will follow directions of drum leader on the first prompt. Patient will demonstrate no behavioral issues during group.  Patient will identify if a reduction in stress level occurs as a result of participation in therapeutic drum circle.     Affect/Mood: N/A   Participation Level: Did not attend    Clinical Observations/Individualized Feedback: Patient did not attend group.   Plan: Continue to engage patient in RT group sessions 2-3x/week.   Rosina Lowenstein, LRT, CTRS 04/20/2023 2:55 PM

## 2023-04-20 NOTE — Progress Notes (Signed)
   04/20/23 1100  Psych Admission Type (Psych Patients Only)  Admission Status Involuntary  Psychosocial Assessment  Patient Complaints Depression  Eye Contact Brief  Facial Expression Flat  Affect Sad  Speech Soft  Interaction Minimal  Motor Activity Slow  Appearance/Hygiene In scrubs  Behavior Characteristics Cooperative;Anxious  Mood Depressed  Thought Process  Coherency WDL  Content WDL  Delusions None reported or observed  Perception WDL  Hallucination None reported or observed  Judgment Impaired  Confusion None  Danger to Self  Current suicidal ideation? Denies  Self-Injurious Behavior No self-injurious ideation or behavior indicators observed or expressed   Agreement Not to Harm Self Yes  Description of Agreement verbal  Danger to Others  Danger to Others None reported or observed   Continues with 1:1 telesitter monitoring. Assisted to bathroom, ADLs, and to meals.

## 2023-04-20 NOTE — Plan of Care (Signed)
  Problem: Education: Goal: Knowledge of [place] [facility] Education information/materials will improve 04/20/2023 0705 by Jovita Gamma, RN Outcome: Progressing 04/20/2023 0704 by Jovita Gamma, RN Outcome: Progressing Goal: Emotional status will improve 04/20/2023 0705 by Jovita Gamma, RN Outcome: Progressing 04/20/2023 0704 by Jovita Gamma, RN Outcome: Progressing Goal: Mental status will improve 04/20/2023 0705 by Jovita Gamma, RN Outcome: Progressing 04/20/2023 0704 by Jovita Gamma, RN Outcome: Progressing Goal: Verbalization of understanding the information provided will improve 04/20/2023 0705 by Jovita Gamma, RN Outcome: Progressing 04/20/2023 0704 by Jovita Gamma, RN Outcome: Progressing   Problem: Activity: Goal: Interest or engagement in activities will improve 04/20/2023 0705 by Jovita Gamma, RN Outcome: Progressing 04/20/2023 0704 by Jovita Gamma, RN Outcome: Progressing Goal: Sleeping patterns will improve 04/20/2023 0705 by Jovita Gamma, RN Outcome: Progressing 04/20/2023 0704 by Jovita Gamma, RN Outcome: Progressing   Problem: Coping: Goal: Ability to verbalize frustrations and anger appropriately will improve 04/20/2023 0705 by Jovita Gamma, RN Outcome: Progressing 04/20/2023 0704 by Jovita Gamma, RN Outcome: Progressing Goal: Ability to demonstrate self-control will improve 04/20/2023 0705 by Jovita Gamma, RN Outcome: Progressing 04/20/2023 0704 by Jovita Gamma, RN Outcome: Progressing

## 2023-04-20 NOTE — Group Note (Signed)
 Recreation Therapy Group Note   Group Topic:Relaxation  Group Date: 04/20/2023 Start Time: 1100 End Time: 1135 Facilitators: Rosina Lowenstein, LRT, CTRS Location:  Dayroom  Group Description: PMR (Progressive Muscle Relaxation). LRT educates patients on what PMR is and the benefits that come from it. Patients are asked to sit with their feet flat on the floor while sitting up and all the way back in their chair, if possible. LRT and pts follow a prompt through a speaker that requires you to tense and release different muscles in their body and focus on their breathing. During session, lights are off and soft music is being played.  Goal Area(s) Addressed:  Patients will be able to describe progressive muscle relaxation.  Patient will practice using relaxation technique. Patient will identify a new coping skill.  Patient will follow multistep directions to reduce anxiety and stress.   Affect/Mood: N/A   Participation Level: Non-verbal    Clinical Observations/Individualized Feedback: [name] was present in the dayroom at the time of group. However, pt did not complete any of the exercises. Pt minimally interacted with LRT and peers while in group.   Plan: Continue to engage patient in RT group sessions 2-3x/week.   Rosina Lowenstein, LRT, CTRS 04/20/2023 12:31 PM

## 2023-04-21 DIAGNOSIS — F332 Major depressive disorder, recurrent severe without psychotic features: Secondary | ICD-10-CM | POA: Diagnosis not present

## 2023-04-21 NOTE — Progress Notes (Signed)
 Occupational Therapy Treatment Patient Details Name: [name] MRN: [id number] DOB: [birth year] Today's Date: 04/21/2023   History of present illness [name] is a 58 year old Caucasian male with a history of MDD, anxiety, Crohn's disease, and chronic mesenteric ischemia who presented to ED on 04/13/23 at 0404 for suicide attempt by ingestion of 28 tablets of 10mg  Lexapro at 0200. Patient sent to behavior Med unit.   OT comments  Pt seen for OT tx. Pt received in his room, wakes easily and agreeable to session. Pt completed mobility and transfers with supv using RW and demonstrating good hand placement. Improvement noted in pt's balance and activity tolerance this date. Pt completed standing toileting with supv and no notable LOB. 1 VC for RW positioning once standing at sink to improve safety.   Pt educated in activity pacing with exertional activity while mobilizing on unit with 1 seated rest break. OT facilitated education and problem solving to incorporate learned strategies for stress mgt, home/routines modifications, and identifying meaningful occupational engagement (basic ADL, IADL, vs leisure) to support promoting pt's confidence and minimize self-identified panic attacks. Pt able to identify walking with staff 2x/day as an activity to promote well being and activity tolerance. Pt also noted that he would love the opportunity to shave his face and endorsed this would be something to help him feel better and "more like me." He said he used to shave his face daily but that ADL participation has been hard since he's been depressed.  Nursing notified and very eager to support pt in goals. Progressing well.       If plan is discharge home, recommend the following:  A little help with walking and/or transfers;A little help with bathing/dressing/bathroom;Assistance with cooking/housework;Assist for transportation;Direct supervision/assist for medications management;Direct  supervision/assist for financial management;Supervision due to cognitive status;Help with stairs or ramp for entrance   Equipment Recommendations  Other (comment) (2ww)    Recommendations for Other Services      Precautions / Restrictions Precautions Precautions: Fall Restrictions Weight Bearing Restrictions Per Provider Order: No       Mobility Bed Mobility Overal bed mobility: Independent                  Transfers Overall transfer level: Modified independent Equipment used: Rolling walker (2 wheels) Transfers: Sit to/from Stand Sit to Stand: Modified independent (Device/Increase time)           General transfer comment: good hand placement to stand from EOB     Balance Overall balance assessment: Mild deficits observed, not formally tested                                         ADL either performed or assessed with clinical judgement   ADL Overall ADL's : Needs assistance/impaired     Grooming: Wash/dry hands;Supervision/safety;Standing;Cueing for safety Grooming Details (indicate cue type and reason): VC for RW placement to improve standing balance at sink                     Toileting- Clothing Manipulation and Hygiene: Sit to/from stand;Supervision/safety Toileting - Clothing Manipulation Details (indicate cue type and reason): supv for toileting in standing with no LOB noted     Functional mobility during ADLs: Supervision/safety;Contact guard assist;Rolling walker (2 wheels)      Extremity/Trunk Assessment  Vision       Perception     Praxis     Communication     Cognition Arousal: Alert Behavior During Therapy: Flat affect Cognition: No family/caregiver present to determine baseline             OT - Cognition Comments: alert, fair insight into deficits, able to identify goal for activity to improve activity tolerance                 Following commands: Intact        Cueing    Cueing Techniques: Verbal cues  Exercises Other Exercises Other Exercises: Pt educated in activity pacing with exertional activity while mobilizing on unit with 1 seated rest break. OT facilitated education and problem solving to incorporate learned strategies for stress mgt, home/routines modifications, and identifying meaningful occupational engagement (basic ADL, IADL, vs leisure) to support promoting pt's confidence and minimize self-identified panic attacks. Pt able to identify walking with staff 2x/day as an activity to promote well being and activity tolerance. Nursing notified and very eager to support pt in goal.    Shoulder Instructions       General Comments      Pertinent Vitals/ Pain       Pain Assessment Pain Assessment: No/denies pain  Home Living                                          Prior Functioning/Environment              Frequency  Min 1X/week        Progress Toward Goals  OT Goals(current goals can now be found in the care plan section)  Progress towards OT goals: Progressing toward goals  Acute Rehab OT Goals Patient Stated Goal: work on getting better OT Goal Formulation: With patient Time For Goal Achievement: 05/02/23 Potential to Achieve Goals: Good  Plan      Co-evaluation                 AM-PAC OT "6 Clicks" Daily Activity     Outcome Measure   Help from another person eating meals?: None Help from another person taking care of personal grooming?: A Little Help from another person toileting, which includes using toliet, bedpan, or urinal?: A Little Help from another person bathing (including washing, rinsing, drying)?: A Little Help from another person to put on and taking off regular upper body clothing?: None Help from another person to put on and taking off regular lower body clothing?: A Little 6 Click Score: 20    End of Session Equipment Utilized During Treatment: Gait belt;Rolling walker (2  wheels)  OT Visit Diagnosis: Other abnormalities of gait and mobility (R26.89);Muscle weakness (generalized) (M62.81)   Activity Tolerance Patient tolerated treatment well   Patient Left in bed;with call bell/phone within reach   Nurse Communication Mobility status;Other (comment) (walking goal)        Time: 1610-9604 OT Time Calculation (min): 32 min  Charges: OT General Charges $OT Visit: 1 Visit OT Treatments $Self Care/Home Management : 23-37 mins  Arman Filter., MPH, MS, OTR/L ascom [phone] 04/21/23, 4:51 PM

## 2023-04-21 NOTE — Group Note (Unsigned)
 Date:  04/21/2023 Time:  4:54 AM  Group Topic/Focus:  Building Self Esteem:   The Focus of this group is helping patients become aware of the effects of self-esteem on their lives, the things they and others do that enhance or undermine their self-esteem, seeing the relationship between their level of self-esteem and the choices they make and learning ways to enhance self-esteem.     Participation Level:  {BHH PARTICIPATION WJXBJ:47829}  Participation Quality:  {BHH PARTICIPATION QUALITY:22265}  Affect:  {BHH AFFECT:22266}  Cognitive:  {BHH COGNITIVE:22267}  Insight: {BHH Insight2:20797}  Engagement in Group:  {BHH ENGAGEMENT IN FAOZH:08657}  Modes of Intervention:  {BHH MODES OF INTERVENTION:22269}  Additional Comments:  ***  [name] 04/21/2023, 4:54 AM

## 2023-04-21 NOTE — Progress Notes (Signed)
 Tele sitter in room for patient safety.    Patient pleasant and cooperative.  Sad affect.  Endroses anxiety and depression.  Passive SI.  Contracts for safety on the unit.  Denies HI and AVH.  Denies pain.    Compliant with scheduled medications.  15 min checks in place for safety. Patient isolates to room with the exception of meals.  Minimal interaction with peers and staff.    Patient able to walk from room to dayroom with walker and 1 assist.  Per OT, patient would like to walk at least 2x a day.

## 2023-04-21 NOTE — Progress Notes (Signed)
 [facility] MD Progress Note  04/21/2023 [name]  MRN:  [id number]  [name] is a 58 year old Caucasian male with a history of MDD, anxiety, Crohn's disease, and chronic mesenteric ischemia who presented to [facility] emergency department on 04/13/23 at 0404 for suicide attempt by ingestion of 28 tablets of 10mg  Lexapro at 0200. Per ED documentation, patient had some mild symptoms of serotonin syndrome on arrival, including acute agitation and clonus, with symptomatic improvement with benzodiazepines. Did have a return of symptoms requiring additional dose of lorazepam. Poison control advised the total amount of ingested Lexapro likely is not enough to cause serotonin syndrome by itself and they recommended 8-hour cardiac monitoring. Qtc 498.  Subjective:  Chart reviewed, case discussed in multidisciplinary meeting, patient seen during rounds.  Not much change in mental status since yesterday.  Patient continues to feel depressed and hopeless.  Patient EKG result discussed with patient.  QTc interval is down to 433.  Patient BUN is slightly high.  Patient was encouraged to drink water and keep himself hydrated.  Patient has a Cabin crew in place due to fall risk.  Patient continues to report suicidal thoughts, denies any intention to harm himself on the unit.  Patient denies psychotic or manic symptoms.  Patient was encouraged to attend group and work on coping strategies.    Sleep: Fair  Appetite:  Fair  Past Psychiatric History: see h&P Family History:  Family History  Problem Relation Age of Onset   Heart disease Father    Thyroid cancer Father    Allergies Father    Clotting disorder Father    Breast cancer Mother    Stomach cancer Mother    Colon cancer Neg Hx    Esophageal cancer Neg Hx    Rectal cancer Neg Hx    Social History:  Social History   Substance and Sexual Activity  Alcohol Use No   Alcohol/week: 0.0 standard drinks of alcohol     Social History    Substance and Sexual Activity  Drug Use Not Currently   Types: Cocaine, Marijuana   Comment: QUIT USING DRUGS IN 1997    Social History   Socioeconomic History   Marital status: Single    Spouse name: Not on file   Number of children: 1   Years of education: Not on file   Highest education level: Not on file  Occupational History   Occupation: Disability   Tobacco Use   Smoking status: Former    Current packs/day: 0.00    Average packs/day: 1 pack/day for 15.0 years (15.0 ttl pk-yrs)    Types: Cigarettes    Start date: 03/28/1982    Quit date: 03/28/1997    Years since quitting: 26.0   Smokeless tobacco: Never  Vaping Use   Vaping status: Never Used  Substance and Sexual Activity   Alcohol use: No    Alcohol/week: 0.0 standard drinks of alcohol   Drug use: Not Currently    Types: Cocaine, Marijuana    Comment: QUIT USING DRUGS IN 1997   Sexual activity: Not Currently  Other Topics Concern   Not on file  Social History Narrative   Single, history of substance abuse in recovery   Previously occupied on medical disability   1 child  Daughter Wellsite geologist, lives and works in [place]   Elderly parents involved and he helps them   1 brother   Social Drivers of Corporate investment banker Strain: Low Risk  (08/28/2019)   Overall  Financial Resource Strain (CARDIA)    Difficulty of Paying Living Expenses: Not hard at all  Food Insecurity: Food Insecurity Present (04/13/2023)   Hunger Vital Sign    Worried About Running Out of Food in the Last Year: Never true    Ran Out of Food in the Last Year: Sometimes true  Transportation Needs: No Transportation Needs (04/13/2023)   PRAPARE - Administrator, Civil Service (Medical): No    Lack of Transportation (Non-Medical): No  Physical Activity: Insufficiently Active (11/08/2019)   Exercise Vital Sign    Days of Exercise per Week: 3 days    Minutes of Exercise per Session: 30 min  Stress: Stress Concern Present  (08/28/2019)   [employer] of Occupational Health - Occupational Stress Questionnaire    Feeling of Stress : Very much  Social Connections: Socially Isolated (03/28/2023)   Social Connection and Isolation Panel [NHANES]    Frequency of Communication with Friends and Family: Three times a week    Frequency of Social Gatherings with Friends and Family: Twice a week    Attends Religious Services: Never    Database administrator or Organizations: No    Attends Engineer, structural: Never    Marital Status: Divorced   Past Medical History:  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 06/15/2018   Cancer of skin of neck    Cataract    Chronic mesenteric ischemia ([name])    Chronic pain syndrome 07/09/2016   Crohn's disease of small and large intestines ([name])    followed by dr Baldo Ash gessner   Dairy product intolerance    Diarrhea, functional    Family history of adverse reaction to anesthesia    Grover's disease    transient acantholytic dermatosis   History of alcohol abuse    History of basal cell carcinoma excision    2013 left leg   History of Clostridium difficile    10/ 2014   History of multiple concussions    x6   last one Jan 2017 per pt--  no residual   History of substance abuse ([name])    quit 1997 per pt   History of suicide attempt    05-18-2012  overdose /  failure to thrive   Iron deficiency anemia due to chronic blood loss    Major depression, recurrent, chronic ([name])    Osteopenia    Personal history of adenomatous colonic polyps 12/2010, 03/2012   12/2010 - 8 mm serrated adenoma of rectum   Portal vein thrombosis 03/21/2015   right   Primary sclerosing cholangitis    ? 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   Substance abuse ([name]) 1997   Alcohol   Vitamin A deficiency 05/23/2018   Vitamin B6 deficiency 07/27/2018   Vitamin C deficiency 05/23/2018    Past Surgical History:  Procedure Laterality Date   ABDOMINAL AORTAGRAM N/A 03/29/2012   Procedure: ABDOMINAL Ronny Flurry;  Surgeon: Nada Libman, MD;  Location: [facility] CATH LAB;  Service: Cardiovascular;  Laterality: N/A;   ADENOIDECTOMY  age 21   BIOPSY  05/31/2018   Procedure: BIOPSY;  Surgeon: Rachael Fee, MD;  Location: [facility] ENDOSCOPY;  Service: Endoscopy;;   CATARACT EXTRACTION W/ INTRAOCULAR LENS  IMPLANT, BILATERAL  2009   COLONOSCOPY  2001, 05/02/2003, 01/28/11   2012: Right colon Crohn's, rectal polyp   COLONOSCOPY  03/31/2012   Procedure: COLONOSCOPY;  Surgeon: Beverley Fiedler, MD;  Location[facility] ENDOSCOPY;  Service: Gastroenterology;  Laterality: N/A;   COLONOSCOPY WITH PROPOFOL N/A 05/31/2018   Procedure: COLONOSCOPY WITH PROPOFOL;  Surgeon: Rachael Fee, MD;  Location: [facility] ENDOSCOPY;  Service: Endoscopy;  Laterality: N/A;   ESOPHAGOGASTRODUODENOSCOPY  01/28/2011   Normal   FOOT SURGERY Right age 42   MOHS SURGERY Left 11/2013   left ankle parakerotosis    PERCUTANEOUS LIVER BIOPSY  2007 and 2008   PILONIDAL CYST EXCISION  age 60   PLACEMENT OF SETON N/A 11/06/2015   Procedure: PLACEMENT OF SETON;  Surgeon: Romie Levee, MD;  Location: [facility];  Service: General;  Laterality: N/A;   PLACEMENT OF SETON  07/2018   at [facility]   RECTAL EXAM UNDER ANESTHESIA N/A 01/18/2019   Procedure: ANAL EXAM UNDER ANESTHESIA,  INCISION AND DRAINAGE, SETON PLACEMENT;  Surgeon: Romie Levee, MD;  Location: [facility];  Service: General;  Laterality: N/A;   UPPER GASTROINTESTINAL ENDOSCOPY      Current Medications: Current Facility-Administered Medications  Medication Dose Route Frequency Provider Last Rate Last Admin   acetaminophen (TYLENOL) tablet 650  mg  650 mg Oral Q6H PRN Motley-Mangrum, Jadeka A, PMHNP       alum & mag hydroxide-simeth (MAALOX/MYLANTA) 200-200-20 MG/5ML suspension 30 mL  30 mL Oral Q4H PRN Motley-Mangrum, Jadeka A, PMHNP       dicyclomine (BENTYL) capsule 10 mg  10 mg Oral TID AC Motley-Mangrum, Jadeka A, PMHNP   10 mg at 04/21/23 1151   doxycycline (VIBRA-TABS) tablet 100 mg  100 mg Oral Q12H Floydene Flock, MD   100 mg at 04/21/23 1610   feeding supplement (ENSURE ENLIVE / ENSURE PLUS) liquid 237 mL  1 Bottle Oral TID BM Verner Chol, MD   237 mL at 04/20/23 1400   hydrOXYzine (ATARAX) tablet 25 mg  25 mg Oral TID PRN Motley-Mangrum, Geralynn Ochs A, PMHNP   25 mg at 04/20/23 1746   LORazepam (ATIVAN) injection 1 mg  1 mg Intramuscular Q4H PRN Remington, Amber E, NP       LORazepam (ATIVAN) injection 2 mg  2 mg Intramuscular TID PRN Motley-Mangrum, Jadeka A, PMHNP       LORazepam (ATIVAN) tablet 1 mg  1 mg Oral Q4H PRN Remington, Amber E, NP   1 mg at 04/21/23 0602   magnesium hydroxide (MILK OF MAGNESIA) suspension 30 mL  30 mL Oral Daily PRN Motley-Mangrum, Jadeka A, PMHNP       multivitamin with minerals tablet 1 tablet  1 tablet Oral Daily Verner Chol, MD   1 tablet at 04/21/23 0811   ondansetron (ZOFRAN) injection 4 mg  4 mg Intravenous Q6H PRN Verner Chol, MD   4 mg at 04/16/23 0905   ondansetron (ZOFRAN-ODT) disintegrating tablet 4 mg  4 mg Oral Q8H PRN Verner Chol, MD       predniSONE (DELTASONE) tablet 30 mg  30 mg Oral Q breakfast Motley-Mangrum, Jadeka A, PMHNP   30 mg at 04/21/23 9604    Lab Results:  Results for orders placed or performed during the hospital encounter of 04/13/23 (from the past 48 hours)  Comprehensive metabolic panel  Status: Abnormal   Collection Time: 04/20/23  2:54 PM  Result Value Ref Range   Sodium 140 135 - 145 mmol/L   Potassium 4.0 3.5 - 5.1 mmol/L   Chloride 105 98 - 111 mmol/L   CO2 27 22 - 32 mmol/L   Glucose, Bld 129 (H) 70 - 99 mg/dL    Comment: Glucose  reference range applies only to samples taken after fasting for at least 8 hours.   BUN 36 (H) 6 - 20 mg/dL   Creatinine, Ser 4.09 0.61 - 1.24 mg/dL   Calcium 8.9 8.9 - 81.1 mg/dL   Total Protein 4.8 (L) 6.5 - 8.1 g/dL   Albumin 2.4 (L) 3.5 - 5.0 g/dL   AST 35 15 - 41 U/L   ALT 39 0 - 44 U/L   Alkaline Phosphatase 87 38 - 126 U/L   Total Bilirubin 0.4 0.0 - 1.2 mg/dL   GFR, Estimated >91 >47 mL/min    Comment: (NOTE) Calculated using the CKD-EPI Creatinine Equation (2021)    Anion gap 8 5 - 15    Comment: Performed at [facility], [address] [place], [place] [address]   *Note: Due to a large number of results and/or encounters for the requested time period, some results have not been displayed. A complete set of results can be found in Results Review.     Blood Alcohol level:  Lab Results  Component Value Date   ETH <10 04/13/2023   ETH <10 03/28/2023    Metabolic Disorder Labs: Lab Results  Component Value Date   HGBA1C 4.8 04/03/2023   MPG 91.06 04/03/2023   No results found for: "PROLACTIN" Lab Results  Component Value Date   CHOL 209 (H) 04/03/2023   TRIG 75 04/03/2023   HDL 65 04/03/2023   CHOLHDL 3.2 04/03/2023   VLDL 15 04/03/2023   LDLCALC 129 (H) 04/03/2023      Psychiatric Specialty Exam:  Presentation  General Appearance:  Appropriate for Environment; Disheveled  Eye Contact: Fair  Speech: Clear and Coherent  Speech Volume: Decreased    Mood and Affect  Depressed  Affect: Restricted   Thought Process  Thought Processes: Coherent  Descriptions of Associations:Intact  Orientation:Full (Time, Place and Person)  Thought Content:Illogical  Hallucinations:Denies  Ideas of Reference:None  Suicidal Thoughts:Positive SI  Homicidal Thoughts:Denies   Sensorium  Memory: Immediate Fair; Recent Fair; Remote Fair  Judgment: Impaired  Insight: Shallow   Executive Functions   Concentration: Poor  Attention Span: Poor  Recall: [employer] of Knowledge: Fair  Language: Fair   Psychomotor Activity  Psychomotor Activity: Decreased  Musculoskeletal: Strength & Muscle Tone: decreased Gait & Station: unsteady uses walker Assets  Assets: Manufacturing systems engineer; Desire for Improvement; Physical Health    Physical Exam: Physical Exam Vitals and nursing note reviewed.  HENT:     Head: Normocephalic.     Nose: Nose normal.     Mouth/Throat:     Mouth: Mucous membranes are moist.  Eyes:     Pupils: Pupils are equal, round, and reactive to light.  Pulmonary:     Breath sounds: Normal breath sounds.  Skin:    General: Skin is warm.  Neurological:     General: No focal deficit present.     Mental Status: He is alert.    Review of Systems  Constitutional:  Positive for malaise/fatigue.  HENT: Negative.    Eyes: Negative.   Cardiovascular: Negative.   Gastrointestinal: Negative.   Blood pressure Marland Kitchen)  130/95, pulse 72, temperature (!) 97 F (36.1 C), resp. rate 18, height 6\' 1"  (1.854 m), weight 60.6 kg, SpO2 100%. Body mass index is 17.61 kg/m.  Diagnosis: Principal Problem:   MDD (major depressive disorder), recurrent severe, without psychosis ([name]) Active Problems:   GAD (generalized anxiety disorder)   Tremor   PLAN: Safety and Monitoring:  -- Involuntary admission to inpatient psychiatric unit for safety, stabilization and treatment  -- Daily contact with patient to assess and evaluate symptoms and progress in treatment  -- Patient's case to be discussed in multi-disciplinary team meeting  -- Observation Level : q15 minute checks  -- Vital signs:  q12 hours  -- Precautions: suicide, elopement, and assault -- Encouraged patient to participate in unit milieu and in scheduled group therapies  2. Psychiatric Diagnoses and Treatment:  Once patient is more medically stable can consider adding Effexor 75 mg to help with the  anxiety/depression and some Abilify for mood stabilization given the intensity of depression      3. Medical Issues Being Addressed:   BUN high, Qtc 433, 04/20/23  4. Discharge Planning:   -- Social work and case management to assist with discharge planning and identification of hospital follow-up needs prior to discharge  -- Discharge date: To be determined -Home health consult date, 04/20/23  Lewanda Rife, MD

## 2023-04-21 NOTE — Plan of Care (Signed)
  Problem: Coping: Goal: Ability to demonstrate self-control will improve Outcome: Progressing   Problem: Safety: Goal: Periods of time without injury will increase Outcome: Progressing   Problem: Activity: Goal: Interest or engagement in activities will improve Outcome: Not Progressing

## 2023-04-21 NOTE — Group Note (Signed)
 Date:  04/21/2023 Time:  9:59 PM  Group Topic/Focus:  Overcoming Stress:   The focus of this group is to define stress and help patients assess their triggers.    Participation Level:  Did Not Attend  Participation Quality:   Did Not Attend  Affect:   Did Not Attend  Cognitive:   Did Not Attend  Insight: None  Engagement in Group:  None  Modes of Intervention:  Exploration  Additional Comments:    Garry Heater 04/21/2023, 9:59 PM

## 2023-04-22 DIAGNOSIS — F332 Major depressive disorder, recurrent severe without psychotic features: Secondary | ICD-10-CM | POA: Diagnosis not present

## 2023-04-22 NOTE — Progress Notes (Signed)
 Occupational Therapy Group Note   Group Topic:  Estate manager/land agent, Adaptive Equipment for ADLs Group Date: 04/22/23 Group Time (start and end): [id number] Facilitators:  Wynona Canes, OT   Group Description: Group educated on safety considerations/modifications with bathing, dressing and ADL routines to maximize independence and minimize fall risk with tasks.  Reviewed and demonstrated use of shower chair and tub transfer bench as appropriate.  Reviewed and demonstrated use of adaptive equipment (sock aide, reacher, long-handled sponge) available for ADL tasks.  Reviewed and demonstrated role of activity pacing and energy conservation with functional activities.  Provided handout with visual reference of available equipment.      Therapeutic Goal(s): Verbalize and demonstrate safe technique for tub/shower transfers with DME/adaptive equipment as needed. Verbalize and demonstrate appropriate use of adaptive equipment with bathing, dressing and ADL routine. Verbalize and demonstrate appropriate use of activity pacing and energy conservation with bathing, dressing and ADL routine.   Individual Participation: Pt visually attended and was engaged during session. Would interact when prompted. Appreciative of instruction. Fair insight.       Participation Level and Quality: Engaged, interactive    Behavior: Appropriate     Speech/Thought Process: Appropriate     Affect/Mood: Appropriate     Insight: Fair    Judgement: Good    Modes of Intervention: Verbal education, visual demonstration, hands out opportunities, handout        Plan: Continue to engage patient in PT/OT groups 1-2x/week.    Arman Filter., MPH, MS, OTR/L ascom [phone] 04/22/23, 4:18 PM

## 2023-04-22 NOTE — Plan of Care (Signed)
 Pt is calm and cooperative on approach. Pt is pleasant and redirectable. Pt has a flat affect and appears sad. Pt denies anxiety or depression, simply states "I have a hard time sleeping and the ativan works for me." Pt was assessed for tremors and does have tremors in both hands, and was given PRN Ativan 1MG  po @2100  for tremors. Pt denied pain. Pt denied SI/HI/AVH. Pt woke up approximately 0400 and stated he was having difficulty going back to sleep. Pt slept about 10hrs waking periodically. Pt was asked if he has any anxiety and pt did endorse anxiety because of his interrupted sleep. Pt was given Atarax 25mg . Medication was slightly effective. Pt finished his Ensure Plus and took his evening medications.  Pt stated his LBM 04/21/23.  Pt remains on continuous monitoring with telemonitor.    04/21/23 2200  Psych Admission Type (Psych Patients Only)  Admission Status Involuntary  Psychosocial Assessment  Patient Complaints Anxiety  Eye Contact Fair  Facial Expression Flat;Sad  Affect Sad  Speech Logical/coherent;Soft  Interaction Minimal  Motor Activity Unsteady  Appearance/Hygiene In scrubs  Behavior Characteristics Cooperative;Anxious  Mood Anxious  Thought Process  Coherency WDL  Content WDL  Delusions None reported or observed  Perception WDL  Hallucination None reported or observed  Judgment Limited  Confusion None  Danger to Self  Current suicidal ideation? Denies  Agreement Not to Harm Self Yes  Description of Agreement verbal  Danger to Others  Danger to Others None reported or observed    Problem: Education: Goal: Mental status will improve Outcome: Progressing   Problem: Coping: Goal: Ability to verbalize frustrations and anger appropriately will improve Outcome: Progressing   Problem: Physical Regulation: Goal: Ability to maintain clinical measurements within normal limits will improve Outcome: Progressing   Problem: Safety: Goal: Periods of time without  injury will increase Outcome: Progressing

## 2023-04-22 NOTE — Progress Notes (Signed)
 PT Cancellation Note  Patient Details Name: [name] MRN: [id number] DOB: 1965/10/07   Cancelled Treatment:     PT attempt. Pt was side lying in bed upon arrival. Was given ativan ~830 this AM. Pt requested to rest at this time and requested author return after lunch." I'm just depressed and tired.Do you mind coming back after lunch? I want you to look at my calf. Its been hurting today. " Acute PT will continue to follow per current POC. Will return this afternoon as requested.    Rushie Chestnut 04/22/2023, 11:53 AM

## 2023-04-22 NOTE — Progress Notes (Signed)
 [name] Progress Note  04/22/2023 [name]  MRN:  [id number]  [name] is a 58 year old Caucasian male with a history of MDD, anxiety, Crohn's disease, and chronic mesenteric ischemia who presented to [facility] emergency department on 04/13/23 at 0404 for suicide attempt by ingestion of 28 tablets of 10mg  Lexapro at 0200. Per ED documentation, patient had some mild symptoms of serotonin syndrome on arrival, including acute agitation and clonus, with symptomatic improvement with benzodiazepines. Did have a return of symptoms requiring additional dose of lorazepam. Poison control advised the total amount of ingested Lexapro likely is not enough to cause serotonin syndrome by itself and they recommended 8-hour cardiac monitoring. Qtc 498.  Subjective:  Chart reviewed, case discussed in multidisciplinary meeting, patient seen during rounds.  Not much change in mental status since yesterday.  Patient continues to feel depressed and hopeless. Patient BUN is slightly high.  Patient was encouraged to drink water and keep himself hydrated.  Patient has a Cabin crew in place due to fall risk.  Patient continues to report suicidal thoughts, denies any intention to harm himself on the unit.  Patient denies psychotic or manic symptoms.  Patient was encouraged to attend group and work on coping strategies.    Sleep: Fair  Appetite:  Fair  Past Psychiatric History: see h&P Family History:  Family History  Problem Relation Age of Onset   Heart disease Father    Thyroid cancer Father    Allergies Father    Clotting disorder Father    Breast cancer Mother    Stomach cancer Mother    Colon cancer Neg Hx    Esophageal cancer Neg Hx    Rectal cancer Neg Hx    Social History:  Social History   Substance and Sexual Activity  Alcohol Use No   Alcohol/week: 0.0 standard drinks of alcohol     Social History   Substance and Sexual Activity  Drug Use Not Currently   Types: Cocaine,  Marijuana   Comment: QUIT USING DRUGS IN 1997    Social History   Socioeconomic History   Marital status: Single    Spouse name: Not on file   Number of children: 1   Years of education: Not on file   Highest education level: Not on file  Occupational History   Occupation: Disability   Tobacco Use   Smoking status: Former    Current packs/day: 0.00    Average packs/day: 1 pack/day for 15.0 years (15.0 ttl pk-yrs)    Types: Cigarettes    Start date: 03/28/1982    Quit date: 03/28/1997    Years since quitting: 26.0   Smokeless tobacco: Never  Vaping Use   Vaping status: Never Used  Substance and Sexual Activity   Alcohol use: No    Alcohol/week: 0.0 standard drinks of alcohol   Drug use: Not Currently    Types: Cocaine, Marijuana    Comment: QUIT USING DRUGS IN 1997   Sexual activity: Not Currently  Other Topics Concern   Not on file  Social History Narrative   Single, history of substance abuse in recovery   Previously occupied on medical disability   1 child  Daughter Wellsite geologist, lives and works in [place]   Elderly parents involved and he helps them   1 brother   Social Drivers of Corporate investment banker Strain: Low Risk  (08/28/2019)   Overall Financial Resource Strain (CARDIA)    Difficulty of Paying Living Expenses: Not hard at  all  Food Insecurity: Food Insecurity Present (04/13/2023)   Hunger Vital Sign    Worried About Running Out of Food in the Last Year: Never true    Ran Out of Food in the Last Year: Sometimes true  Transportation Needs: No Transportation Needs (04/13/2023)   PRAPARE - Administrator, Civil Service (Medical): No    Lack of Transportation (Non-Medical): No  Physical Activity: Insufficiently Active (11/08/2019)   Exercise Vital Sign    Days of Exercise per Week: 3 days    Minutes of Exercise per Session: 30 min  Stress: Stress Concern Present (08/28/2019)   [employer] of Occupational Health - Occupational Stress  Questionnaire    Feeling of Stress : Very much  Social Connections: Socially Isolated (03/28/2023)   Social Connection and Isolation Panel [NHANES]    Frequency of Communication with Friends and Family: Three times a week    Frequency of Social Gatherings with Friends and Family: Twice a week    Attends Religious Services: Never    Database administrator or Organizations: No    Attends Engineer, structural: Never    Marital Status: Divorced   Past Medical History:  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 06/15/2018   Cancer of skin of neck    Cataract    Chronic mesenteric ischemia ([name])    Chronic pain syndrome 07/09/2016   Crohn's disease of small and large intestines ([name])    followed by dr Baldo Ash gessner   Dairy product intolerance    Diarrhea, functional    Family history of adverse reaction to anesthesia    Grover's disease    transient acantholytic dermatosis   History of alcohol abuse    History of basal cell carcinoma excision    2013 left leg   History of Clostridium difficile    10/ 2014   History of multiple concussions    x6   last one Jan 2017 per pt--  no residual   History of substance abuse ([name])    quit 1997 per pt   History of suicide attempt    05-18-2012  overdose /  failure to thrive   Iron deficiency anemia due to chronic blood loss    Major depression, recurrent, chronic ([name])    Osteopenia    Personal history of adenomatous colonic polyps 12/2010, 03/2012   12/2010 - 8 mm serrated adenoma of rectum   Portal vein thrombosis 03/21/2015   right   Primary sclerosing cholangitis    ? 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   Substance abuse  ([name]) 1997   Alcohol   Vitamin A deficiency 05/23/2018   Vitamin B6 deficiency 07/27/2018   Vitamin C deficiency 05/23/2018    Past Surgical History:  Procedure Laterality Date   ABDOMINAL AORTAGRAM N/A 03/29/2012   Procedure: ABDOMINAL Ronny Flurry;  Surgeon: Nada Libman, [name];  Location: [facility] C[facility] LAB;  Service: Cardiovascular;  Laterality: N/A;   ADENOIDECTOMY  age 59   BIOPSY  05/31/2018   Procedure: BIOPSY;  Surgeon: Rachael Fee, [name];  Location: [facility];  Service: Endoscopy;;   CATARACT  EXTRACTION W/ INTRAOCULAR LENS  IMPLANT, BILATERAL  2009   COLONOSCOPY  2001, 05/02/2003, 01/28/11   2012: Right colon Crohn's, rectal polyp   COLONOSCOPY  03/31/2012   Procedure: COLONOSCOPY;  Surgeon: Beverley Fiedler, [name];  Location: [facility]COPY;  Service: Gastroenterology;  Laterality: N/A;   COLONOSCOPY WITH PROPOFOL N/A 05/31/2018   Procedure: COLONOSCOPY WITH PROPOFOL;  Surgeon: Rachael Fee, [name];  Location: [facility];  Service: Endoscopy;  Laterality: N/A;   ESOPHAGOGASTRODUODENOSCOPY  01/28/2011   Normal   FOOT SURGERY Right age 73   MOHS SURGERY Left 11/2013   left ankle parakerotosis    PERCUTANEOUS LIVER BIOPSY  2007 and 2008   PILONIDAL CYST EXCISION  age 66   PLACEMENT OF SETON N/A 11/06/2015   Procedure: PLACEMENT OF SETON;  Surgeon: Romie Levee, [name];  Location: [facility];  Service: General;  Laterality: N/A;   PLACEMENT OF SETON  07/2018   at [facility]   RECTAL EXAM UNDER ANESTHESIA N/A 01/18/2019   Procedure: ANAL EXAM UNDER ANESTHESIA,  INCISION AND DRAINAGE, SETON PLACEMENT;  Surgeon: Romie Levee, [name];  Location: [facility];  Service: General;  Laterality: N/A;   UPPER GASTROINTESTINAL ENDOSCOPY      Current Medications: Current Facility-Administered Medications  Medication Dose Route Frequency Provider Last Rate Last Admin   acetaminophen (TYLENOL) tablet 650 mg  650 mg Oral Q6H PRN Motley-Mangrum, Jadeka A, PMHNP       alum & mag  hydroxide-simeth (MAALOX/MYLANTA) 200-200-20 MG/5ML suspension 30 mL  30 mL Oral Q4H PRN Motley-Mangrum, Jadeka A, PMHNP       dicyclomine (BENTYL) capsule 10 mg  10 mg Oral TID AC Motley-Mangrum, Jadeka A, PMHNP   10 mg at 04/22/23 1203   doxycycline (VIBRA-TABS) tablet 100 mg  100 mg Oral Q12H Floydene Flock, [name]   100 mg at 04/22/23 2952   feeding supplement (ENSURE ENLIVE / ENSURE PLUS) liquid 237 mL  1 Bottle Oral TID BM Verner Chol, [name]   237 mL at 04/22/23 1408   hydrOXYzine (ATARAX) tablet 25 mg  25 mg Oral TID PRN Motley-Mangrum, Geralynn Ochs A, PMHNP   25 mg at 04/22/23 0433   LORazepam (ATIVAN) injection 1 mg  1 mg Intramuscular Q4H PRN Remington, Amber E, NP       LORazepam (ATIVAN) injection 2 mg  2 mg Intramuscular TID PRN Motley-Mangrum, Jadeka A, PMHNP       LORazepam (ATIVAN) tablet 1 mg  1 mg Oral Q4H PRN Remington, Amber E, NP   1 mg at 04/22/23 8413   magnesium hydroxide (MILK OF MAGNESIA) suspension 30 mL  30 mL Oral Daily PRN Motley-Mangrum, Jadeka A, PMHNP       multivitamin with minerals tablet 1 tablet  1 tablet Oral Daily Verner Chol, [name]   1 tablet at 04/22/23 0838   ondansetron (ZOFRAN) injection 4 mg  4 mg Intravenous Q6H PRN Verner Chol, [name]   4 mg at 04/16/23 0905   ondansetron (ZOFRAN-ODT) disintegrating tablet 4 mg  4 mg Oral Q8H PRN Verner Chol, [name]       predniSONE (DELTASONE) tablet 30 mg  30 mg Oral Q breakfast Motley-Mangrum, Jadeka A, PMHNP   30 mg at 04/22/23 0841    Lab Results:  No results found. However, due to the size of the patient record, not all encounters were searched. Please check Results Review for a complete set of results.    Blood Alcohol level:  Lab Results  Component Value  Date   ETH <10 04/13/2023   ETH <10 03/28/2023    Metabolic Disorder Labs: Lab Results  Component Value Date   HGBA1C 4.8 04/03/2023   MPG 91.06 04/03/2023   No results found for: "PROLACTIN" Lab Results  Component Value Date   CHOL 209 (H)  04/03/2023   TRIG 75 04/03/2023   HDL 65 04/03/2023   CHOLHDL 3.2 04/03/2023   VLDL 15 04/03/2023   LDLCALC 129 (H) 04/03/2023      Psychiatric Specialty Exam:  Presentation  General Appearance:  Appropriate for Environment; Disheveled  Eye Contact: Fair  Speech: Clear and Coherent  Speech Volume: Decreased    Mood and Affect  Depressed  Affect: Restricted   Thought Process  Thought Processes: Coherent  Descriptions of Associations:Intact  Orientation:Full (Time, Place and Person)  Thought Content:Illogical  Hallucinations:Denies  Ideas of Reference:None  Suicidal Thoughts:Positive SI  Homicidal Thoughts:Denies   Sensorium  Memory: Immediate Fair; Recent Fair; Remote Fair  Judgment: Impaired  Insight: Shallow   Executive Functions  Concentration: Poor  Attention Span: Poor  Recall: [employer] of Knowledge: Fair  Language: Fair   Psychomotor Activity  Psychomotor Activity: Decreased  Musculoskeletal: Strength & Muscle Tone: decreased Gait & Station: unsteady uses walker Assets  Assets: Manufacturing systems engineer; Desire for Improvement; Physical Health    Physical Exam: Physical Exam Vitals and nursing note reviewed.  HENT:     Head: Normocephalic.     Nose: Nose normal.     Mouth/Throat:     Mouth: Mucous membranes are moist.  Eyes:     Pupils: Pupils are equal, round, and reactive to light.  Pulmonary:     Breath sounds: Normal breath sounds.  Skin:    General: Skin is warm.  Neurological:     General: No focal deficit present.     Mental Status: He is alert.    Review of Systems  Constitutional:  Positive for malaise/fatigue.  HENT: Negative.    Eyes: Negative.   Cardiovascular: Negative.   Gastrointestinal: Negative.   Blood pressure 139/70, pulse 67, temperature 97.9 F (36.6 C), resp. rate 18, height 6\' 1"  (1.854 m), weight 60.6 kg, SpO2 100%. Body mass index is 17.61 kg/m.  Diagnosis: Principal  Problem:   MDD (major depressive disorder), recurrent severe, without psychosis ([name]) Active Problems:   GAD (generalized anxiety disorder)   Tremor   PLAN: Safety and Monitoring:  -- Involuntary admission to inpatient psychiatric unit for safety, stabilization and treatment  -- Daily contact with patient to assess and evaluate symptoms and progress in treatment  -- Patient's case to be discussed in multi-disciplinary team meeting  -- Observation Level : q15 minute checks  -- Vital signs:  q12 hours  -- Precautions: suicide, elopement, and assault -- Encouraged patient to participate in unit milieu and in scheduled group therapies  2. Psychiatric Diagnoses and Treatment:  Once patient is more medically stable can consider adding Effexor 75 mg to help with the anxiety/depression and some Abilify for mood stabilization given the intensity of depression      3. Medical Issues Being Addressed:   BUN high, Qtc 433, 04/20/23  Will repeat BUN/CR tomorrow AM  4. Discharge Planning:   -- Social work and case management to assist with discharge planning and identification of hospital follow-up needs prior to discharge  -- Discharge date: To be determined -Home health consult date, 04/20/23 Will call Patient's mother for an update  tomorrow ([place] [phone].)   Lewanda Rife,  [name]

## 2023-04-22 NOTE — Plan of Care (Addendum)
 D: Pt alert and oriented. Pt reports experiencing anxiety/depression at this time. Pt denies experiencing any pain at this time however, reports experiencing 3/10 in right calf with movement. Pt denies needing anything for pain at this time. Pt denies experiencing any HI, or AVH at this time however, reports experiencing SI w/o plan while here. Pt contracts for safety and will let staff know if anything changes.   A: Scheduled medications administered to pt, per MD orders. Support and encouragement provided. Frequent verbal contact made. Routine safety checks conducted q15 minutes. Pt on continuous tele sitter.   R: No adverse drug reactions noted. Pt verbally contracts for safety at this time. Pt compliant with medications and treatment plan. Pt interacts minimally with others on the unit. Pt remains safe at this time. Plan of care ongoing.  Problem: Education: Goal: Emotional status will improve Outcome: Not Progressing Goal: Mental status will improve Outcome: Not Progressing

## 2023-04-22 NOTE — BHH Counselor (Signed)
 CSW spoke to pt about discharge plan.   Pt reports he is unsure where he can go at discharge. Pt reports that he would like to go to ALF or SNF.   CSW informed pt he may not meet requirements for ALF or SNF due to age and needs.   Pt reports his brother is looking into a facility for him.   CSW will contact pt's brother per his request.   Reynaldo Minium, [name], [facility] 04/22/2023 2:56 PM

## 2023-04-22 NOTE — Group Note (Signed)
 Date:  04/22/2023 Time:  8:34 PM  Group Topic/Focus:  Overcoming Stress:   The focus of this group is to define stress and help patients assess their triggers.    Participation Level:  Did Not Attend  Participation Quality:   Did Not Attend  Affect:   Did Not Attend  Cognitive:   Did Not Attend  Insight: None  Engagement in Group:  None  Modes of Intervention:   Did Not Attend  Additional Comments:    Garry Heater 04/22/2023, 8:34 PM

## 2023-04-22 NOTE — Progress Notes (Signed)
 Physical Therapy Treatment Patient Details Name: [name] MRN: [id number] DOB: [birth year] Today's Date: 04/22/2023   History of Present Illness [name] is a 58 year old Caucasian male with a history of MDD, anxiety, Crohn's disease, and chronic mesenteric ischemia who presented to ED on 04/13/23 at 0404 for suicide attempt by ingestion of 28 tablets of 10mg  Lexapro at 0200. Patient sent to behavior Med unit.    PT Comments  Pt was side lying in bed upon arrival. He agrees to session and remains cooperative throughout. Endorse calf pain earlier however does not endorse pain this afternoon. Pt was able to exit bed, stand to RW, and tolerate ambulate ~ 120 ft. Pt has poor gait posture and poor heel strike on RLE. Encouraged attempting to ambulate with HHA +1 however pt unwilling due to fear/fatigue. Pt overall tolerated session well. DC recs remain appropriate.    If plan is discharge home, recommend the following: A little help with walking and/or transfers;A little help with bathing/dressing/bathroom;Assist for transportation;Help with stairs or ramp for entrance;Assistance with cooking/housework;Direct supervision/assist for financial management;Direct supervision/assist for medications management     Equipment Recommendations  Rolling walker (2 wheels)       Precautions / Restrictions Precautions Precautions: Fall Restrictions Weight Bearing Restrictions Per Provider Order: No     Mobility  Bed Mobility Overal bed mobility: Modified Independent   Transfers Overall transfer level: Modified independent Equipment used: Rolling walker (2 wheels) Transfers: Sit to/from Stand Sit to Stand: Modified independent (Device/Increase time)   Ambulation/Gait Ambulation/Gait assistance: Supervision Gait Distance (Feet): 120 Feet Assistive device: Rolling walker (2 wheels) Gait Pattern/deviations: Decreased step length - right, Decreased step length - left, Decreased stride  length, Decreased dorsiflexion - right, Decreased dorsiflexion - left, Trunk flexed Gait velocity: decr  General Gait Details: pt was able to ambulate 1 x 120 ft with RW. no LOB. vcs for looking forward, depressing shoulders, and improve footclearance. pt tends to have flat foot stance phase. poor heelstrike bilaterally but worse on R then L    Balance Overall balance assessment: Needs assistance Sitting-balance support: Feet supported Sitting balance-Leahy Scale: Good     Standing balance support: Bilateral upper extremity supported Standing balance-Leahy Scale: Fair     Hotel manager: No apparent difficulties  Cognition Arousal: Alert Behavior During Therapy: Flat affect   PT - Cognitive impairments: No apparent impairments  Following commands: Intact      Cueing Cueing Techniques: Verbal cues         Pertinent Vitals/Pain Pain Assessment Pain Assessment: No/denies pain Pain Location: R calf Pain Descriptors / Indicators: Sore Pain Intervention(s): Limited activity within patient's tolerance, Monitored during session, Premedicated before session, Repositioned     PT Goals (current goals can now be found in the care plan section) Acute Rehab PT Goals Patient Stated Goal: "Tolerate more activity without getting tired." Progress towards PT goals: Progressing toward goals    Frequency    Min 1X/week       AM-PAC PT "6 Clicks" Mobility   Outcome Measure  Help needed turning from your back to your side while in a flat bed without using bedrails?: None Help needed moving from lying on your back to sitting on the side of a flat bed without using bedrails?: None Help needed moving to and from a bed to a chair (including a wheelchair)?: A Little Help needed standing up from a chair using your arms (e.g., wheelchair or bedside chair)?: A Little Help needed to walk in hospital  room?: A Little Help needed climbing 3-5 steps with a railing? : A  Little 6 Click Score: 20    End of Session Equipment Utilized During Treatment: Gait belt Activity Tolerance: Patient limited by fatigue Patient left: in bed Nurse Communication: Mobility status PT Visit Diagnosis: Muscle weakness (generalized) (M62.81);Difficulty in walking, not elsewhere classified (R26.2)     Time: 4627-0350 PT Time Calculation (min) (ACUTE ONLY): 16 min  Charges:    $Gait Training: 8-22 mins PT General Charges $$ ACUTE PT VISIT: 1 Visit                    Jetta Lout PTA 04/22/23, 3:24 PM

## 2023-04-23 DIAGNOSIS — F332 Major depressive disorder, recurrent severe without psychotic features: Secondary | ICD-10-CM | POA: Diagnosis not present

## 2023-04-23 LAB — BUN: BUN: 24 mg/dL — ABNORMAL HIGH (ref 6–20)

## 2023-04-23 LAB — CREATININE, SERUM
Creatinine, Ser: 0.91 mg/dL (ref 0.61–1.24)
GFR, Estimated: >60 mL/min (ref >60)

## 2023-04-23 MED ORDER — VENLAFAXINE HCL ER 37.5 MG PO CP24
37.5000 mg | ORAL_CAPSULE | Freq: Every day | ORAL | Status: DC
Start: 2023-04-24 — End: 2023-04-25
  Administered 2023-04-24 – 2023-04-25 (×2): 37.5 mg via ORAL
  Filled 2023-04-23 (×2): qty 1

## 2023-04-23 NOTE — BHH Counselor (Signed)
 CSW attempted call to pt's brother Hobson Lax, [phone]) to discuss placement options as requested. No answer. Voicemail message was left.   CSW will continue attempts to reach pt collateral.

## 2023-04-23 NOTE — Plan of Care (Signed)
  Problem: Education: Goal: Knowledge of [facility] Education information/materials will improve Outcome: Progressing Goal: Verbalization of understanding the information provided will improve Outcome: Progressing   Problem: Activity: Goal: Interest or engagement in activities will improve Outcome: Progressing Goal: Sleeping patterns will improve Outcome: Progressing   Problem: Coping: Goal: Ability to verbalize frustrations and anger appropriately will improve Outcome: Progressing Goal: Ability to demonstrate self-control will improve Outcome: Progressing   Problem: Health Behavior/Discharge Planning: Goal: Identification of resources available to assist in meeting health care needs will improve Outcome: Progressing Goal: Compliance with treatment plan for underlying cause of condition will improve Outcome: Progressing   Problem: Physical Regulation: Goal: Ability to maintain clinical measurements within normal limits will improve Outcome: Progressing   Problem: Safety: Goal: Periods of time without injury will increase Outcome: Progressing

## 2023-04-23 NOTE — Progress Notes (Signed)
 [facility] MD Progress Note  04/23/2023 [name]  MRN:  [id number]  [name] is a 58 year old Caucasian male with a history of MDD, anxiety, Crohn's disease, and chronic mesenteric ischemia who presented to [facility] emergency department on 04/13/23 at 0404 for suicide attempt by ingestion of 28 tablets of 10mg  Lexapro at 0200. Per ED documentation, patient had some mild symptoms of serotonin syndrome on arrival, including acute agitation and clonus, with symptomatic improvement with benzodiazepines. Did have a return of symptoms requiring additional dose of lorazepam. Poison control advised the total amount of ingested Lexapro likely is not enough to cause serotonin syndrome by itself and they recommended 8-hour cardiac monitoring. Qtc 498.  Subjective:  Chart reviewed, case discussed in multidisciplinary meeting, patient seen during rounds. Patient continues to feel depressed and hopeless. Patient BUN is trending down.  Patient was encouraged to drink water and keep himself hydrated.  Patient has a Cabin crew in place due to fall risk.  Patient continues to report suicidal thoughts, denies any intention to harm himself on the unit.  We discussed restarting Effexor.  Patient agrees with the plan.  Patient denies psychotic or manic symptoms.  Patient was encouraged to attend group and work on coping strategies.   I spoke with patient's mother ,[place] via phone today.  Patient's mother was updated about patient's progress.  Mother's questions and concerns were addressed to her satisfaction.  Sleep: Fair  Appetite:  Fair  Past Psychiatric History: see h&P Family History:  Family History  Problem Relation Age of Onset   Heart disease Father    Thyroid cancer Father    Allergies Father    Clotting disorder Father    Breast cancer Mother    Stomach cancer Mother    Colon cancer Neg Hx    Esophageal cancer Neg Hx    Rectal cancer Neg Hx    Social History:  Social History    Substance and Sexual Activity  Alcohol Use No   Alcohol/week: 0.0 standard drinks of alcohol     Social History   Substance and Sexual Activity  Drug Use Not Currently   Types: Cocaine, Marijuana   Comment: QUIT USING DRUGS IN 1997    Social History   Socioeconomic History   Marital status: Single    Spouse name: Not on file   Number of children: 1   Years of education: Not on file   Highest education level: Not on file  Occupational History   Occupation: Disability   Tobacco Use   Smoking status: Former    Current packs/day: 0.00    Average packs/day: 1 pack/day for 15.0 years (15.0 ttl pk-yrs)    Types: Cigarettes    Start date: 03/28/1982    Quit date: 03/28/1997    Years since quitting: 26.0   Smokeless tobacco: Never  Vaping Use   Vaping status: Never Used  Substance and Sexual Activity   Alcohol use: No    Alcohol/week: 0.0 standard drinks of alcohol   Drug use: Not Currently    Types: Cocaine, Marijuana    Comment: QUIT USING DRUGS IN 1997   Sexual activity: Not Currently  Other Topics Concern   Not on file  Social History Narrative   Single, history of substance abuse in recovery   Previously occupied on medical disability   1 child  Daughter Wellsite geologist, lives and works in [place]   Elderly parents involved and he helps them   1 brother   Social Drivers of  Health   Financial Resource Strain: Low Risk  (08/28/2019)   Overall Financial Resource Strain (CARDIA)    Difficulty of Paying Living Expenses: Not hard at all  Food Insecurity: Food Insecurity Present (04/13/2023)   Hunger Vital Sign    Worried About Running Out of Food in the Last Year: Never true    Ran Out of Food in the Last Year: Sometimes true  Transportation Needs: No Transportation Needs (04/13/2023)   PRAPARE - Administrator, Civil Service (Medical): No    Lack of Transportation (Non-Medical): No  Physical Activity: Insufficiently Active (11/08/2019)   Exercise Vital Sign     Days of Exercise per Week: 3 days    Minutes of Exercise per Session: 30 min  Stress: Stress Concern Present (08/28/2019)   [employer] of Occupational Health - Occupational Stress Questionnaire    Feeling of Stress : Very much  Social Connections: Socially Isolated (03/28/2023)   Social Connection and Isolation Panel [NHANES]    Frequency of Communication with Friends and Family: Three times a week    Frequency of Social Gatherings with Friends and Family: Twice a week    Attends Religious Services: Never    Database administrator or Organizations: No    Attends Engineer, structural: Never    Marital Status: Divorced   Past Medical History:  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 06/15/2018   Cancer of skin of neck    Cataract    Chronic mesenteric ischemia ([name])    Chronic pain syndrome 07/09/2016   Crohn's disease of small and large intestines ([name])    followed by dr Baldo Ash gessner   Dairy product intolerance    Diarrhea, functional    Family history of adverse reaction to anesthesia    Grover's disease    transient acantholytic dermatosis   History of alcohol abuse    History of basal cell carcinoma excision    2013 left leg   History of Clostridium difficile    10/ 2014   History of multiple concussions    x6   last one Jan 2017 per pt--  no residual   History of substance abuse ([name])    quit 1997 per pt   History of suicide attempt    05-18-2012  overdose /  failure to thrive   Iron deficiency anemia due to chronic blood loss    Major depression, recurrent, chronic ([name])    Osteopenia    Personal history of adenomatous colonic polyps 12/2010, 03/2012   12/2010 - 8 mm serrated adenoma of rectum   Portal vein thrombosis  03/21/2015   right   Primary sclerosing cholangitis    ? 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   Substance abuse ([name]) 1997   Alcohol   Vitamin A deficiency 05/23/2018   Vitamin B6 deficiency 07/27/2018   Vitamin C deficiency 05/23/2018    Past Surgical History:  Procedure Laterality Date   ABDOMINAL AORTAGRAM N/A 03/29/2012   Procedure: ABDOMINAL Ronny Flurry;  Surgeon: Nada Libman, MD;  Location: [facility] CATH LAB;  Service: Cardiovascular;  Laterality: N/A;   ADENOIDECTOMY  age 60   BIOPSY  05/31/2018   Procedure: BIOPSY;  Surgeon: Rachael Fee, MD;  Location: [facility] ENDOSCOPY;  Service: Endoscopy;;   CATARACT EXTRACTION W/ INTRAOCULAR LENS  IMPLANT, BILATERAL  2009   COLONOSCOPY  2001, 05/02/2003, 01/28/11   2012: Right colon Crohn's, rectal polyp   COLONOSCOPY  03/31/2012   Procedure: COLONOSCOPY;  Surgeon: Beverley Fiedler, MD;  Location: [facility] ENDOSCOPY;  Service: Gastroenterology;  Laterality: N/A;   COLONOSCOPY WITH PROPOFOL N/A 05/31/2018   Procedure: COLONOSCOPY WITH PROPOFOL;  Surgeon: Rachael Fee, MD;  Location: [facility] ENDOSCOPY;  Service: Endoscopy;  Laterality: N/A;   ESOPHAGOGASTRODUODENOSCOPY  01/28/2011   Normal   FOOT SURGERY Right age 41   MOHS SURGERY Left 11/2013   left ankle parakerotosis    PERCUTANEOUS LIVER BIOPSY  2007 and 2008   PILONIDAL CYST EXCISION  age 33   PLACEMENT OF SETON N/A 11/06/2015   Procedure: PLACEMENT OF SETON;  Surgeon: Romie Levee, MD;  Location: [facility];  Service: General;  Laterality: N/A;   PLACEMENT OF SETON  07/2018   at [facility]   RECTAL EXAM UNDER ANESTHESIA N/A 01/18/2019   Procedure: ANAL EXAM UNDER ANESTHESIA,  INCISION AND DRAINAGE, SETON PLACEMENT;  Surgeon: Romie Levee, MD;  Location: [facility];  Service: General;  Laterality: N/A;   UPPER GASTROINTESTINAL ENDOSCOPY      Current Medications: Current Facility-Administered  Medications  Medication Dose Route Frequency Provider Last Rate Last Admin   acetaminophen (TYLENOL) tablet 650 mg  650 mg Oral Q6H PRN Motley-Mangrum, Jadeka A, PMHNP       alum & mag hydroxide-simeth (MAALOX/MYLANTA) 200-200-20 MG/5ML suspension 30 mL  30 mL Oral Q4H PRN Motley-Mangrum, Jadeka A, PMHNP       dicyclomine (BENTYL) capsule 10 mg  10 mg Oral TID AC Motley-Mangrum, Jadeka A, PMHNP   10 mg at 04/23/23 2956   doxycycline (VIBRA-TABS) tablet 100 mg  100 mg Oral Q12H Floydene Flock, MD   100 mg at 04/23/23 2130   feeding supplement (ENSURE ENLIVE / ENSURE PLUS) liquid 237 mL  1 Bottle Oral TID BM Verner Chol, MD   237 mL at 04/23/23 0941   hydrOXYzine (ATARAX) tablet 25 mg  25 mg Oral TID PRN Motley-Mangrum, Geralynn Ochs A, PMHNP   25 mg at 04/23/23 0210   LORazepam (ATIVAN) injection 1 mg  1 mg Intramuscular Q4H PRN Remington, Amber E, NP       LORazepam (ATIVAN) injection 2 mg  2 mg Intramuscular TID PRN Motley-Mangrum, Jadeka A, PMHNP       LORazepam (ATIVAN) tablet 1 mg  1 mg Oral Q4H PRN Remington, Amber E, NP   1 mg at 04/23/23 8657   magnesium hydroxide (MILK OF MAGNESIA) suspension 30 mL  30 mL Oral Daily PRN Motley-Mangrum, Jadeka A, PMHNP       multivitamin with minerals tablet 1 tablet  1 tablet Oral Daily Verner Chol, MD   1 tablet at 04/23/23 0937   ondansetron (ZOFRAN) injection 4 mg  4 mg Intravenous Q6H PRN Verner Chol, MD   4 mg at 04/16/23 0905   ondansetron (ZOFRAN-ODT) disintegrating tablet 4 mg  4 mg Oral Q8H PRN Verner Chol, MD       predniSONE (DELTASONE) tablet 30 mg  30 mg Oral Q breakfast Motley-Mangrum, Jadeka A, PMHNP   30 mg at 04/23/23 8469    Lab Results:  Results for orders placed or performed during the hospital encounter of 04/13/23 (  from the past 48 hours)  BUN     Status: Abnormal   Collection Time: 04/23/23  8:55 AM  Result Value Ref Range   BUN 24 (H) 6 - 20 mg/dL    Comment: Performed at [facility], [address]  [place], [place] [address]  Creatinine, serum     Status: None   Collection Time: 04/23/23  8:55 AM  Result Value Ref Range   Creatinine, Ser 0.91 0.61 - 1.24 mg/dL   GFR, Estimated >60 >45 mL/min    Comment: (NOTE) Calculated using the CKD-EPI Creatinine Equation (2021) Performed at [facility], [address] [place], [place] [address]    *Note: Due to a large number of results and/or encounters for the requested time period, some results have not been displayed. A complete set of results can be found in Results Review.      Blood Alcohol level:  Lab Results  Component Value Date   ETH <10 04/13/2023   ETH <10 03/28/2023    Metabolic Disorder Labs: Lab Results  Component Value Date   HGBA1C 4.8 04/03/2023   MPG 91.06 04/03/2023   No results found for: "PROLACTIN" Lab Results  Component Value Date   CHOL 209 (H) 04/03/2023   TRIG 75 04/03/2023   HDL 65 04/03/2023   CHOLHDL 3.2 04/03/2023   VLDL 15 04/03/2023   LDLCALC 129 (H) 04/03/2023      Psychiatric Specialty Exam:  Presentation  General Appearance:  Improving  Eye Contact: Fair  Speech: Clear and Coherent  Speech Volume: Decreased    Mood and Affect  Depressed  Affect: Restricted   Thought Process  Thought Processes: Coherent  Descriptions of Associations:Intact  Orientation:Full (Time, Place and Person)  Thought Content: Improved  Hallucinations:Denies  Ideas of Reference:None  Suicidal Thoughts:Positive SI  Homicidal Thoughts:Denies   Sensorium  Memory: Immediate Fair; Recent Fair; Remote Fair  Judgment: Improving  Insight: Improving   Executive Functions  Concentration: Poor  Attention Span: Poor  Recall: [employer] of Knowledge: Fair  Language: Fair   Psychomotor Activity  Psychomotor Activity: Decreased  Musculoskeletal: Strength & Muscle Tone: decreased Gait & Station: unsteady uses walker Assets  Assets: Manufacturing systems engineer;  Desire for Improvement; Physical Health    Physical Exam: Physical Exam Vitals and nursing note reviewed.  HENT:     Head: Normocephalic.     Nose: Nose normal.     Mouth/Throat:     Mouth: Mucous membranes are moist.  Eyes:     Pupils: Pupils are equal, round, and reactive to light.  Pulmonary:     Breath sounds: Normal breath sounds.  Skin:    General: Skin is warm.  Neurological:     General: No focal deficit present.     Mental Status: He is alert.    Review of Systems  Constitutional:  Negative for chills and fever.  HENT: Negative.    Eyes: Negative.   Cardiovascular: Negative.   Gastrointestinal: Negative.   Blood pressure (!) 145/91, pulse 65, temperature 98.5 F (36.9 C), resp. rate 18, height 6\' 1"  (1.854 m), weight 60.6 kg, SpO2 100%. Body mass index is 17.61 kg/m.  Diagnosis: Principal Problem:   MDD (major depressive disorder), recurrent severe, without psychosis ([name]) Active Problems:   GAD (generalized anxiety disorder)   Tremor   PLAN: Safety and Monitoring:  -- Involuntary admission to inpatient psychiatric unit for safety, stabilization and treatment  -- Daily contact with patient to assess and evaluate symptoms  and progress in treatment  -- Patient's case to be discussed in multi-disciplinary team meeting  -- Observation Level : q15 minute checks  -- Vital signs:  q12 hours  -- Precautions: suicide, elopement, and assault -- Encouraged patient to participate in unit milieu and in scheduled group therapies  2. Psychiatric Diagnoses and Treatment:  Will restart Effexor at 37.5 mg by mouth daily for depression     3. Medical Issues Being Addressed:   BUN trending down,  Qtc 433, 04/20/23    4. Discharge Planning:   -- Social work and case management to assist with discharge planning and identification of hospital follow-up needs prior to discharge  -- Discharge date: To be determined -Home health consult date, 04/20/23   Lewanda Rife,  MD

## 2023-04-23 NOTE — Group Note (Signed)
 [facility] LCSW Group Therapy Note   Group Date: 04/23/2023 Start Time: 1305 End Time: 1335   Type of Therapy/Topic:  Group Therapy:  Emotion Regulation  Participation Level:  Active    Description of Group:    The purpose of this group is to assist patients in learning to regulate negative emotions and experience positive emotions. Patients will be guided to discuss ways in which they have been vulnerable to their negative emotions. These vulnerabilities will be juxtaposed with experiences of positive emotions or situations, and patients challenged to use positive emotions to combat negative ones. Special emphasis will be placed on coping with negative emotions in conflict situations, and patients will process healthy conflict resolution skills.  Therapeutic Goals: Patient will identify two positive emotions or experiences to reflect on in order to balance out negative emotions:  Patient will label two or more emotions that they find the most difficult to experience:  Patient will be able to demonstrate positive conflict resolution skills through discussion or role plays:   Summary of Patient Progress:   Patient was alert and active in group. Patient was able to describe the way different emotions determine how we feel.       Therapeutic Modalities:   Cognitive Behavioral Therapy Feelings Identification Dialectical Behavioral Therapy   Whitney Post, LCSWA

## 2023-04-23 NOTE — Progress Notes (Signed)
   04/23/23 0734  Psych Admission Type (Psych Patients Only)  Admission Status Involuntary  Psychosocial Assessment  Patient Complaints Anxiety  Eye Contact Fair  Facial Expression Flat  Affect Anxious;Depressed  Speech Soft  Interaction Minimal  Motor Activity Unsteady  Appearance/Hygiene In scrubs  Behavior Characteristics Cooperative  Mood Anxious;Sad  Thought Process  Coherency WDL  Content WDL  Delusions None reported or observed  Perception WDL  Hallucination None reported or observed  Judgment Impaired  Confusion None  Danger to Self  Current suicidal ideation? Denies  Danger to Others  Danger to Others None reported or observed

## 2023-04-23 NOTE — Plan of Care (Addendum)
 Progress Note/ Careplan  Pt remains on close observation with telemonitor services. Sleep remains interrupted, waking twice. Atrax 25mg  po given for anxiety; medication slightly effective. LBM 04/22/23. Denies SI/HI/AVH. Anxiety 8/10 and depression 10/10; pt states it's because he doesn't know where he will go post discharge. Pt states "there are a lot of bad places and I don't want to got to them." Pt was encouraged to discuss discharge plans with social work. Q15 observation ordered.  Remains x1 assist for ambulating and encouraged to rotate sides while laying in the bed.  Pt slept 9.25hrs. Will continue to monitor    04/22/23 2209  Psych Admission Type (Psych Patients Only)  Admission Status Involuntary  Psychosocial Assessment  Patient Complaints Anxiety;Depression  Eye Contact Fair  Facial Expression Flat;Sad  Affect Sad  Speech Logical/coherent;Soft  Interaction Minimal  Motor Activity Unsteady  Appearance/Hygiene In scrubs  Behavior Characteristics Cooperative;Anxious  Mood Anxious;Sad;Depressed  Thought Process  Coherency WDL  Content WDL  Delusions None reported or observed  Perception WDL  Hallucination None reported or observed  Judgment Limited  Confusion None  Danger to Self  Current suicidal ideation? Denies  Agreement Not to Harm Self Yes  Description of Agreement Verbal  Danger to Others  Danger to Others None reported or observed   Problem: Education: Goal: Emotional status will improve Outcome: Progressing   Problem: Activity: Goal: Sleeping patterns will improve Outcome: Progressing   Problem: Safety: Goal: Periods of time without injury will increase Outcome: Progressing

## 2023-04-24 DIAGNOSIS — F332 Major depressive disorder, recurrent severe without psychotic features: Secondary | ICD-10-CM | POA: Diagnosis not present

## 2023-04-24 NOTE — Progress Notes (Signed)
 Patient lying in bed at the beginning of shift;  OOB for evening snack  Took all scheduled medications.  Affect flat, mood depressed and rated moderately severe and expressed hopelessness.  Pt explained "I am traumatized and thought about going back to the same home were my aged parents between the age of 56 and 25 are, and thought of taking care of them the past two plus years, is overwhelming. Also, fear of the unknown - going out to look for somewhere strange to live are all the things that make me depress the moore."  When asked about safety outside the hospital, pt states, I may likely attempt suicide again when out of the hospital." He denied SI, plan and intent in the hospital, and contracted for safety.  Pt endorsed anxiety rated moderate, atarax given, effective. At about 0350 hours pt noted to be incontinence bowl; he was given a change of brief on request. Pt continues to ambulated with walker supervised.    04/23/23 2100  Psych Admission Type (Psych Patients Only)  Admission Status Involuntary  Psychosocial Assessment  Patient Complaints Anxiety;Hopelessness;Depression  Eye Contact Fair  Facial Expression Flat  Affect Anxious;Depressed  Speech Soft  Interaction Minimal;Isolative  Motor Activity Unsteady  Appearance/Hygiene In scrubs  Behavior Characteristics Cooperative;Calm  Mood Depressed;Preoccupied  Thought Process  Coherency WDL  Content WDL  Delusions None reported or observed  Perception WDL  Hallucination None reported or observed  Judgment Impaired  Confusion None  Danger to Self  Current suicidal ideation? Denies  Agreement Not to Harm Self Yes  Description of Agreement Verbal  Danger to Others  Danger to Others None reported or observed   Problem: Education: Goal: Emotional status will improve Outcome: Progressing Goal: Mental status will improve Outcome: Progressing Goal: Verbalization of understanding the information provided will improve Outcome:  Progressing   Problem: Coping: Goal: Ability to verbalize frustrations and anger appropriately will improve Outcome: Progressing

## 2023-04-24 NOTE — Progress Notes (Signed)
 [facility] MD Progress Note  04/24/2023 [name] MRN:  [id number]  [name] is a 58 year old Caucasian male with a history of MDD, anxiety, Crohn's disease, and chronic mesenteric ischemia who presented to [facility] emergency department on 04/13/23 at 0404 for suicide attempt by ingestion of 28 tablets of 10mg  Lexapro at 0200. Per ED documentation, patient had some mild symptoms of serotonin syndrome on arrival, including acute agitation and clonus, with symptomatic improvement with benzodiazepines. Did have a return of symptoms requiring additional dose of lorazepam. Poison control advised the total amount of ingested Lexapro likely is not enough to cause serotonin syndrome by itself and they recommended 8-hour cardiac monitoring. Qtc 498.  Subjective:  Chart reviewed, case discussed in multidisciplinary meeting, patient seen during rounds. Patient continues to feel depressed and hopeless. Not much change in mental status since yesterday. Patient reports he has attended few groups. Patient has a Cabin crew in place due to fall risk.  Patient continues to report suicidal thoughts, denies any intention to harm himself on the unit.  He denies side effects from  Effexor.  Patient denies psychotic or manic symptoms.  Patient was encouraged to attend group and work on coping strategies.   I spoke with patient's mother,[name] via phone yesterday, 04/23/23.  Patient's mother was updated about patient's progress.  Mother's questions and concerns were addressed to her satisfaction.  Sleep: Fair  Appetite:  Fair  Past Psychiatric History: see h&P Family History:  Family History  Problem Relation Age of Onset   Heart disease Father    Thyroid cancer Father    Allergies Father    Clotting disorder Father    Breast cancer Mother    Stomach cancer Mother    Colon cancer Neg Hx    Esophageal cancer Neg Hx    Rectal cancer Neg Hx    Social History:  Social History   Substance and  Sexual Activity  Alcohol Use No   Alcohol/week: 0.0 standard drinks of alcohol     Social History   Substance and Sexual Activity  Drug Use Not Currently   Types: Cocaine, Marijuana   Comment: QUIT USING DRUGS IN 1997    Social History   Socioeconomic History   Marital status: Single    Spouse name: Not on file   Number of children: 1   Years of education: Not on file   Highest education level: Not on file  Occupational History   Occupation: Disability   Tobacco Use   Smoking status: Former    Current packs/day: 0.00    Average packs/day: 1 pack/day for 15.0 years (15.0 ttl pk-yrs)    Types: Cigarettes    Start date: 03/28/1982    Quit date: 03/28/1997    Years since quitting: 26.0   Smokeless tobacco: Never  Vaping Use   Vaping status: Never Used  Substance and Sexual Activity   Alcohol use: No    Alcohol/week: 0.0 standard drinks of alcohol   Drug use: Not Currently    Types: Cocaine, Marijuana    Comment: QUIT USING DRUGS IN 1997   Sexual activity: Not Currently  Other Topics Concern   Not on file  Social History Narrative   Single, history of substance abuse in recovery   Previously occupied on medical disability   1 child  Daughter Wellsite geologist, lives and works in [place]   Elderly parents involved and he helps them   1 brother   Social Drivers of Corporate investment banker  Strain: Low Risk  (08/28/2019)   Overall Financial Resource Strain (CARDIA)    Difficulty of Paying Living Expenses: Not hard at all  Food Insecurity: Food Insecurity Present (04/13/2023)   Hunger Vital Sign    Worried About Running Out of Food in the Last Year: Never true    Ran Out of Food in the Last Year: Sometimes true  Transportation Needs: No Transportation Needs (04/13/2023)   PRAPARE - Administrator, Civil Service (Medical): No    Lack of Transportation (Non-Medical): No  Physical Activity: Insufficiently Active (11/08/2019)   Exercise Vital Sign    Days of Exercise  per Week: 3 days    Minutes of Exercise per Session: 30 min  Stress: Stress Concern Present (08/28/2019)   [employer] of Occupational Health - Occupational Stress Questionnaire    Feeling of Stress : Very much  Social Connections: Socially Isolated (03/28/2023)   Social Connection and Isolation Panel [NHANES]    Frequency of Communication with Friends and Family: Three times a week    Frequency of Social Gatherings with Friends and Family: Twice a week    Attends Religious Services: Never    Database administrator or Organizations: No    Attends Engineer, structural: Never    Marital Status: Divorced   Past Medical History:  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 06/15/2018   Cancer of skin of neck    Cataract    Chronic mesenteric ischemia ([name])    Chronic pain syndrome 07/09/2016   Crohn's disease of small and large intestines ([name])    followed by dr Baldo Ash gessner   Dairy product intolerance    Diarrhea, functional    Family history of adverse reaction to anesthesia    Grover's disease    transient acantholytic dermatosis   History of alcohol abuse    History of basal cell carcinoma excision    2013 left leg   History of Clostridium difficile    10/ 2014   History of multiple concussions    x6   last one Jan 2017 per pt--  no residual   History of substance abuse ([name])    quit 1997 per pt   History of suicide attempt    05-18-2012  overdose /  failure to thrive   Iron deficiency anemia due to chronic blood loss    Major depression, recurrent, chronic ([name])    Osteopenia    Personal history of adenomatous colonic polyps 12/2010, 03/2012   12/2010 - 8 mm serrated adenoma of rectum   Portal vein thrombosis 03/21/2015   right    Primary sclerosing cholangitis    ? 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   Substance abuse ([name]) 1997   Alcohol   Vitamin A deficiency 05/23/2018   Vitamin B6 deficiency 07/27/2018   Vitamin C deficiency 05/23/2018    Past Surgical History:  Procedure Laterality Date   ABDOMINAL AORTAGRAM N/A 03/29/2012   Procedure: ABDOMINAL Ronny Flurry;  Surgeon: Nada Libman, MD;  Location: [facility] CATH LAB;  Service: Cardiovascular;  Laterality: N/A;   ADENOIDECTOMY  age 12  BIOPSY  05/31/2018   Procedure: BIOPSY;  Surgeon: Rachael Fee, MD;  Location: [facility] ENDOSCOPY;  Service: Endoscopy;;   CATARACT EXTRACTION W/ INTRAOCULAR LENS  IMPLANT, BILATERAL  2009   COLONOSCOPY  2001, 05/02/2003, 01/28/11   2012: Right colon Crohn's, rectal polyp   COLONOSCOPY  03/31/2012   Procedure: COLONOSCOPY;  Surgeon: Beverley Fiedler, MD;  Location: [facility] ENDOSCOPY;  Service: Gastroenterology;  Laterality: N/A;   COLONOSCOPY WITH PROPOFOL N/A 05/31/2018   Procedure: COLONOSCOPY WITH PROPOFOL;  Surgeon: Rachael Fee, MD;  Location: [facility] ENDOSCOPY;  Service: Endoscopy;  Laterality: N/A;   ESOPHAGOGASTRODUODENOSCOPY  01/28/2011   Normal   FOOT SURGERY Right age 39   MOHS SURGERY Left 11/2013   left ankle parakerotosis    PERCUTANEOUS LIVER BIOPSY  2007 and 2008   PILONIDAL CYST EXCISION  age 13   PLACEMENT OF SETON N/A 11/06/2015   Procedure: PLACEMENT OF SETON;  Surgeon: Romie Levee, MD;  Location: [facility];  Service: General;  Laterality: N/A;   PLACEMENT OF SETON  07/2018   at [facility]   RECTAL EXAM UNDER ANESTHESIA N/A 01/18/2019   Procedure: ANAL EXAM UNDER ANESTHESIA,  INCISION AND DRAINAGE, SETON PLACEMENT;  Surgeon: Romie Levee, MD;  Location: [facility];  Service: General;  Laterality: N/A;   UPPER GASTROINTESTINAL ENDOSCOPY      Current Medications: Current Facility-Administered Medications   Medication Dose Route Frequency Provider Last Rate Last Admin   acetaminophen (TYLENOL) tablet 650 mg  650 mg Oral Q6H PRN Motley-Mangrum, Jadeka A, PMHNP       alum & mag hydroxide-simeth (MAALOX/MYLANTA) 200-200-20 MG/5ML suspension 30 mL  30 mL Oral Q4H PRN Motley-Mangrum, Jadeka A, PMHNP       doxycycline (VIBRA-TABS) tablet 100 mg  100 mg Oral Q12H Floydene Flock, MD   100 mg at 04/24/23 0757   feeding supplement (ENSURE ENLIVE / ENSURE PLUS) liquid 237 mL  1 Bottle Oral TID BM Verner Chol, MD   237 mL at 04/24/23 1425   hydrOXYzine (ATARAX) tablet 25 mg  25 mg Oral TID PRN Motley-Mangrum, Ezra Sites, PMHNP   25 mg at 04/24/23 1610   LORazepam (ATIVAN) injection 1 mg  1 mg Intramuscular Q4H PRN Remington, Amber E, NP       LORazepam (ATIVAN) injection 2 mg  2 mg Intramuscular TID PRN Motley-Mangrum, Jadeka A, PMHNP       LORazepam (ATIVAN) tablet 1 mg  1 mg Oral Q4H PRN Remington, Amber E, NP   1 mg at 04/24/23 0941   magnesium hydroxide (MILK OF MAGNESIA) suspension 30 mL  30 mL Oral Daily PRN Motley-Mangrum, Jadeka A, PMHNP       multivitamin with minerals tablet 1 tablet  1 tablet Oral Daily Verner Chol, MD   1 tablet at 04/24/23 0756   ondansetron (ZOFRAN) injection 4 mg  4 mg Intravenous Q6H PRN Verner Chol, MD   4 mg at 04/16/23 0905   ondansetron (ZOFRAN-ODT) disintegrating tablet 4 mg  4 mg Oral Q8H PRN Verner Chol, MD       predniSONE (DELTASONE) tablet 30 mg  30 mg Oral Q breakfast Motley-Mangrum, Jadeka A, PMHNP   30 mg at 04/24/23 0757   venlafaxine XR (EFFEXOR-XR) 24 hr capsule 37.5 mg  37.5 mg Oral Q breakfast Lewanda Rife, MD   37.5 mg at 04/24/23 9604    Lab Results:  Results for orders placed or performed during the hospital encounter of 04/13/23 (from the  past 48 hours)  BUN     Status: Abnormal   Collection Time: 04/23/23  8:55 AM  Result Value Ref Range   BUN 24 (H) 6 - 20 mg/dL    Comment: Performed at [facility], [address] [place], [place] [address]  Creatinine, serum     Status: None   Collection Time: 04/23/23  8:55 AM  Result Value Ref Range   Creatinine, Ser 0.91 0.61 - 1.24 mg/dL   GFR, Estimated >91 >47 mL/min    Comment: (NOTE) Calculated using the CKD-EPI Creatinine Equation (2021) Performed at [facility], [address] [place], [place] [address]    *Note: Due to a large number of results and/or encounters for the requested time period, some results have not been displayed. A complete set of results can be found in Results Review.      Blood Alcohol level:  Lab Results  Component Value Date   ETH <10 04/13/2023   ETH <10 03/28/2023    Metabolic Disorder Labs: Lab Results  Component Value Date   HGBA1C 4.8 04/03/2023   MPG 91.06 04/03/2023   No results found for: "PROLACTIN" Lab Results  Component Value Date   CHOL 209 (H) 04/03/2023   TRIG 75 04/03/2023   HDL 65 04/03/2023   CHOLHDL 3.2 04/03/2023   VLDL 15 04/03/2023   LDLCALC 129 (H) 04/03/2023      Psychiatric Specialty Exam:  Presentation  General Appearance:  Improving  Eye Contact: Fair  Speech: Clear and Coherent  Speech Volume: Decreased    Mood and Affect  Depressed  Affect: Restricted   Thought Process  Thought Processes: Coherent  Descriptions of Associations:Intact  Orientation:Full (Time, Place and Person)  Thought Content: Improved  Hallucinations:Denies  Ideas of Reference:None  Suicidal Thoughts:Positive SI  Homicidal Thoughts:Denies   Sensorium  Memory: Immediate Fair; Recent Fair; Remote Fair  Judgment: Improving  Insight: Improving   Executive Functions  Concentration: Poor  Attention Span: Poor  Recall: [employer] of Knowledge: Fair  Language: Fair   Psychomotor Activity  Psychomotor Activity: Decreased  Musculoskeletal: Strength & Muscle Tone: decreased Gait & Station: unsteady uses walker Assets  Assets: Investment banker, corporate; Desire for Improvement; Physical Health    Physical Exam: Physical Exam Vitals and nursing note reviewed.  HENT:     Head: Normocephalic.     Nose: Nose normal.     Mouth/Throat:     Mouth: Mucous membranes are moist.  Eyes:     Pupils: Pupils are equal, round, and reactive to light.  Pulmonary:     Breath sounds: Normal breath sounds.  Skin:    General: Skin is warm.  Neurological:     General: No focal deficit present.     Mental Status: He is alert.    Review of Systems  Constitutional:  Negative for chills and fever.  HENT: Negative.    Eyes: Negative.   Cardiovascular: Negative.   Gastrointestinal: Negative.   Blood pressure 115/73, pulse 73, temperature (!) 97.1 F (36.2 C), resp. rate 18, height 6\' 1"  (1.854 m), weight 60.6 kg, SpO2 100%. Body mass index is 17.61 kg/m.  Diagnosis: Principal Problem:   MDD (major depressive disorder), recurrent severe, without psychosis ([name]) Active Problems:   GAD (generalized anxiety disorder)   Tremor   PLAN: Safety and Monitoring:  -- Involuntary admission to inpatient psychiatric unit for safety, stabilization and treatment  -- Daily contact with patient to assess and evaluate symptoms and progress  in treatment  -- Patient's case to be discussed in multi-disciplinary team meeting  -- Observation Level : q15 minute checks  -- Vital signs:  q12 hours  -- Precautions: suicide, elopement, and assault -- Encouraged patient to participate in unit milieu and in scheduled group therapies  2. Psychiatric Diagnoses and Treatment:  Will restart Effexor at 37.5 mg by mouth daily for depression     3. Medical Issues Being Addressed:   BUN trending down,  Qtc 433, 04/20/23    4. Discharge Planning:   -- Social work and case management to assist with discharge planning and identification of hospital follow-up needs prior to discharge  -- Discharge date: To be determined -Home health consult date, 04/20/23   Lewanda Rife, MD

## 2023-04-24 NOTE — Plan of Care (Signed)
   Problem: Education: Goal: Emotional status will improve Outcome: Not Progressing Goal: Mental status will improve Outcome: Not Progressing

## 2023-04-24 NOTE — Group Note (Signed)
 Date:  04/24/2023 Time:  10:35 PM  Group Topic/Focus:  Goals Group:   The focus of this group is to help patients establish daily goals to achieve during treatment and discuss how the patient can incorporate goal setting into their daily lives to aide in recovery.    Participation Level:  Did Not Attend  Participation Quality:   Did Not Attend  Affect:   Did Not Attend  Cognitive:   Did Not Attend  Insight: None  Engagement in Group:  None  Modes of Intervention:  Discussion  Additional Comments:    Garry Heater 04/24/2023, 10:35 PM

## 2023-04-24 NOTE — Group Note (Signed)
 Date:  04/23/2023 Time:  08:30PM  Group Topic/Focus:  Self Care:   The focus of this group is to help patients understand the importance of self-care in order to improve or restore emotional, physical, spiritual, interpersonal, and financial health.    Participation Level:  Minimal  Participation Quality:  Appropriate  Affect:  Appropriate  Cognitive:  Appropriate  Insight: Good  Engagement in Group:  Engaged  Modes of Intervention:  Support  Additional Comments:  Had one on one conversation with patient.  Garry Heater 04/23/2023, 08:30PM

## 2023-04-24 NOTE — Progress Notes (Signed)
   04/24/23 0800  Psych Admission Type (Psych Patients Only)  Admission Status Involuntary  Psychosocial Assessment  Patient Complaints Depression;Anxiety  Eye Contact Fair  Facial Expression Flat  Affect Depressed  Speech Soft  Interaction Minimal  Motor Activity Unsteady  Appearance/Hygiene In scrubs  Behavior Characteristics Cooperative  Mood Depressed;Anxious  Thought Process  Coherency WDL  Content WDL  Delusions None reported or observed  Perception WDL  Hallucination None reported or observed  Judgment Limited  Confusion None  Danger to Self  Current suicidal ideation? Passive  Agreement Not to Harm Self Yes  Description of Agreement verbal  Danger to Others  Danger to Others None reported or observed

## 2023-04-25 ENCOUNTER — Ambulatory Visit (HOSPITAL_COMMUNITY): Payer: Medicare Other | Admitting: Family

## 2023-04-25 DIAGNOSIS — F332 Major depressive disorder, recurrent severe without psychotic features: Secondary | ICD-10-CM | POA: Diagnosis not present

## 2023-04-25 MED ORDER — ZINC OXIDE 40 % EX OINT
TOPICAL_OINTMENT | Freq: Two times a day (BID) | CUTANEOUS | Status: DC | PRN
  Filled 2023-04-25: qty 113

## 2023-04-25 MED ORDER — VENLAFAXINE HCL ER 75 MG PO CP24
75.0000 mg | ORAL_CAPSULE | Freq: Every day | ORAL | Status: DC
  Administered 2023-04-26 – 2023-05-04 (×9): 75 mg via ORAL
  Filled 2023-04-25 (×9): qty 1

## 2023-04-25 NOTE — Group Note (Signed)
 Date:  04/25/2023 Time:  9:42 PM  Group Topic/Focus:  Wrap-Up Group:   The focus of this group is to help patients review their daily goal of treatment and discuss progress on daily workbooks.    Participation Level:  Did Not Attend  Participation Quality:      Affect:      Cognitive:      Insight: None  Engagement in Group:  None  Modes of Intervention:      Additional Comments:    Maeola Harman 04/25/2023, 9:42 PM

## 2023-04-25 NOTE — Progress Notes (Signed)
 [facility] MD Progress Note  04/25/2023 4:35 PM [name]  MRN:  [id number]  Carle Fenech is a 58 year old Caucasian male with a history of MDD, anxiety, Crohn's disease, and chronic mesenteric ischemia who presented to [facility] emergency department on 04/13/23 at 0404 for suicide attempt by ingestion of 28 tablets of 10mg  Lexapro at 0200. Per ED documentation, patient had some mild symptoms of serotonin syndrome on arrival, including acute agitation and clonus, with symptomatic improvement with benzodiazepines. Did have a return of symptoms requiring additional dose of lorazepam. Poison control advised the total amount of ingested Lexapro likely is not enough to cause serotonin syndrome by itself and they recommended 8-hour cardiac monitoring. Qtc 498.  Subjective:  Chart reviewed, case discussed in multidisciplinary meeting, patient seen during rounds.  Today on interview patient is noted to be resting in bed.  He reports that he is feeling okay and feels safe as long as he is in the inpatient unit but reports that he is afraid that his suicidal ideations might come back when he returns home.  Provider discussed the social worker update on patient's brother helping him with arranging home health or ACT team to help with his medication.  Patient reports feeling safe with somebody managing his medication.  He continues to report feeling tired and a motivated.  He is working with physical therapy and Occupational Therapy.  He is taking his medications with no problems.  Provider discussed about optimizing his medications and also discussed the option of ECT that he can consider once he gets discharged from the inpatient unit. Sleep: Fair  Appetite:  Fair  Past Psychiatric History: see h&P Family History:  Family History  Problem Relation Age of Onset   Heart disease Father    Thyroid cancer Father    Allergies Father    Clotting disorder Father    Breast cancer Mother    Stomach cancer  Mother    Colon cancer Neg Hx    Esophageal cancer Neg Hx    Rectal cancer Neg Hx    Social History:  Social History   Substance and Sexual Activity  Alcohol Use No   Alcohol/week: 0.0 standard drinks of alcohol     Social History   Substance and Sexual Activity  Drug Use Not Currently   Types: Cocaine, Marijuana   Comment: QUIT USING DRUGS IN 1997    Social History   Socioeconomic History   Marital status: Single    Spouse name: Not on file   Number of children: 1   Years of education: Not on file   Highest education level: Not on file  Occupational History   Occupation: Disability   Tobacco Use   Smoking status: Former    Current packs/day: 0.00    Average packs/day: 1 pack/day for 15.0 years (15.0 ttl pk-yrs)    Types: Cigarettes    Start date: 03/28/1982    Quit date: 03/28/1997    Years since quitting: 26.0   Smokeless tobacco: Never  Vaping Use   Vaping status: Never Used  Substance and Sexual Activity   Alcohol use: No    Alcohol/week: 0.0 standard drinks of alcohol   Drug use: Not Currently    Types: Cocaine, Marijuana    Comment: QUIT USING DRUGS IN 1997   Sexual activity: Not Currently  Other Topics Concern   Not on file  Social History Narrative   Single, history of substance abuse in recovery   Previously occupied on medical disability  1 child  Daughter Wellsite geologist, lives and works in [place]   Elderly parents involved and he helps them   1 brother   Social Drivers of Corporate investment banker Strain: Low Risk  (08/28/2019)   Overall Financial Resource Strain (CARDIA)    Difficulty of Paying Living Expenses: Not hard at all  Food Insecurity: Food Insecurity Present (04/13/2023)   Hunger Vital Sign    Worried About Running Out of Food in the Last Year: Never true    Ran Out of Food in the Last Year: Sometimes true  Transportation Needs: No Transportation Needs (04/13/2023)   PRAPARE - Administrator, Civil Service (Medical): No     Lack of Transportation (Non-Medical): No  Physical Activity: Insufficiently Active (11/08/2019)   Exercise Vital Sign    Days of Exercise per Week: 3 days    Minutes of Exercise per Session: 30 min  Stress: Stress Concern Present (08/28/2019)   [employer] of Occupational Health - Occupational Stress Questionnaire    Feeling of Stress : Very much  Social Connections: Socially Isolated (03/28/2023)   Social Connection and Isolation Panel [NHANES]    Frequency of Communication with Friends and Family: Three times a week    Frequency of Social Gatherings with Friends and Family: Twice a week    Attends Religious Services: Never    Database administrator or Organizations: No    Attends Engineer, structural: Never    Marital Status: Divorced   Past Medical History:  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 06/15/2018   Cancer of skin of neck    Cataract    Chronic mesenteric ischemia ([name])    Chronic pain syndrome 07/09/2016   Crohn's disease of small and large intestines ([name])    followed by dr Baldo Ash gessner   Dairy product intolerance    Diarrhea, functional    Family history of adverse reaction to anesthesia    Grover's disease    transient acantholytic dermatosis   History of alcohol abuse    History of basal cell carcinoma excision    2013 left leg   History of Clostridium difficile    10/ 2014   History of multiple concussions    x6   last one Jan 2017 per pt--  no residual   History of substance abuse ([name])    quit 1997 per pt   History of suicide attempt    05-18-2012  overdose /  failure to thrive   Iron deficiency anemia due to chronic blood loss    Major depression, recurrent, chronic ([name])    Osteopenia    Personal  history of adenomatous colonic polyps 12/2010, 03/2012   12/2010 - 8 mm serrated adenoma of rectum   Portal vein thrombosis 03/21/2015   right   Primary sclerosing cholangitis    ? 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   Substance abuse ([name]) 1997   Alcohol   Vitamin A deficiency 05/23/2018   Vitamin B6 deficiency 07/27/2018   Vitamin C deficiency 05/23/2018    Past Surgical History:  Procedure Laterality Date  ABDOMINAL AORTAGRAM N/A 03/29/2012   Procedure: ABDOMINAL AORTAGRAM;  Surgeon: Nada Libman, MD;  Location: [facility] CATH LAB;  Service: Cardiovascular;  Laterality: N/A;   ADENOIDECTOMY  age 81   BIOPSY  05/31/2018   Procedure: BIOPSY;  Surgeon: Rachael Fee, MD;  Location: [facility] ENDOSCOPY;  Service: Endoscopy;;   CATARACT EXTRACTION W/ INTRAOCULAR LENS  IMPLANT, BILATERAL  2009   COLONOSCOPY  2001, 05/02/2003, 01/28/11   2012: Right colon Crohn's, rectal polyp   COLONOSCOPY  03/31/2012   Procedure: COLONOSCOPY;  Surgeon: Beverley Fiedler, MD;  Location: [facility] ENDOSCOPY;  Service: Gastroenterology;  Laterality: N/A;   COLONOSCOPY WITH PROPOFOL N/A 05/31/2018   Procedure: COLONOSCOPY WITH PROPOFOL;  Surgeon: Rachael Fee, MD;  Location: [facility] ENDOSCOPY;  Service: Endoscopy;  Laterality: N/A;   ESOPHAGOGASTRODUODENOSCOPY  01/28/2011   Normal   FOOT SURGERY Right age 26   MOHS SURGERY Left 11/2013   left ankle parakerotosis    PERCUTANEOUS LIVER BIOPSY  2007 and 2008   PILONIDAL CYST EXCISION  age 18   PLACEMENT OF SETON N/A 11/06/2015   Procedure: PLACEMENT OF SETON;  Surgeon: Romie Levee, MD;  Location: [facility] ;  Service: General;  Laterality: N/A;   PLACEMENT OF SETON  07/2018   at [facility]   RECTAL EXAM UNDER ANESTHESIA N/A 01/18/2019   Procedure: ANAL EXAM UNDER ANESTHESIA,  INCISION AND DRAINAGE, SETON PLACEMENT;  Surgeon: Romie Levee, MD;  Location: [facility];   Service: General;  Laterality: N/A;   UPPER GASTROINTESTINAL ENDOSCOPY      Current Medications: Current Facility-Administered Medications  Medication Dose Route Frequency Provider Last Rate Last Admin   acetaminophen (TYLENOL) tablet 650 mg  650 mg Oral Q6H PRN Motley-Mangrum, Jadeka A, PMHNP       alum & mag hydroxide-simeth (MAALOX/MYLANTA) 200-200-20 MG/5ML suspension 30 mL  30 mL Oral Q4H PRN Motley-Mangrum, Jadeka A, PMHNP       doxycycline (VIBRA-TABS) tablet 100 mg  100 mg Oral Q12H Floydene Flock, MD   100 mg at 04/25/23 1914   feeding supplement (ENSURE ENLIVE / ENSURE PLUS) liquid 237 mL  1 Bottle Oral TID BM Verner Chol, MD   237 mL at 04/25/23 1350   hydrOXYzine (ATARAX) tablet 25 mg  25 mg Oral TID PRN Motley-Mangrum, Ezra Sites, PMHNP   25 mg at 04/25/23 1211   liver oil-zinc oxide (DESITIN) 40 % ointment   Topical Q12H PRN Verner Chol, MD       LORazepam (ATIVAN) injection 1 mg  1 mg Intramuscular Q4H PRN Remington, Amber E, NP       LORazepam (ATIVAN) injection 2 mg  2 mg Intramuscular TID PRN Motley-Mangrum, Jadeka A, PMHNP       LORazepam (ATIVAN) tablet 1 mg  1 mg Oral Q4H PRN Remington, Amber E, NP   1 mg at 04/25/23 0931   magnesium hydroxide (MILK OF MAGNESIA) suspension 30 mL  30 mL Oral Daily PRN Motley-Mangrum, Jadeka A, PMHNP       multivitamin with minerals tablet 1 tablet  1 tablet Oral Daily Verner Chol, MD   1 tablet at 04/25/23 0928   ondansetron (ZOFRAN) injection 4 mg  4 mg Intravenous Q6H PRN Verner Chol, MD   4 mg at 04/16/23 0905   ondansetron (ZOFRAN-ODT) disintegrating tablet 4 mg  4 mg Oral Q8H PRN Verner Chol, MD       predniSONE (DELTASONE) tablet 30 mg  30 mg Oral Q breakfast Motley-Mangrum, Geralynn Ochs  A, PMHNP   30 mg at 04/25/23 0929   venlafaxine XR (EFFEXOR-XR) 24 hr capsule 37.5 mg  37.5 mg Oral Q breakfast Lewanda Rife, MD   37.5 mg at 04/25/23 1610    Lab Results:  No results found. However, due to the size of the patient  record, not all encounters were searched. Please check Results Review for a complete set of results.   Blood Alcohol level:  Lab Results  Component Value Date   ETH <10 04/13/2023   ETH <10 03/28/2023    Metabolic Disorder Labs: Lab Results  Component Value Date   HGBA1C 4.8 04/03/2023   MPG 91.06 04/03/2023   No results found for: "PROLACTIN" Lab Results  Component Value Date   CHOL 209 (H) 04/03/2023   TRIG 75 04/03/2023   HDL 65 04/03/2023   CHOLHDL 3.2 04/03/2023   VLDL 15 04/03/2023   LDLCALC 129 (H) 04/03/2023    Physical Findings: AIMS:  , ,  ,  ,    CIWA:    COWS:      Psychiatric Specialty Exam:  Presentation  General Appearance:  Appropriate for Environment; Casual  Eye Contact: Fair  Speech: Slow  Speech Volume: Decreased    Mood and Affect  Mood: Hopeless; Dysphoric  Affect: Depressed; Flat   Thought Process  Thought Processes: Coherent  Descriptions of Associations:Intact  Orientation:Full (Time, Place and Person)  Thought Content:Illogical  Hallucinations:Hallucinations: None  Ideas of Reference:None  Suicidal Thoughts:Suicidal Thoughts: Yes, Passive SI Passive Intent and/or Plan: Without Plan; Without Intent  Homicidal Thoughts:Homicidal Thoughts: No   Sensorium  Memory: Immediate Fair; Recent Fair; Remote Fair  Judgment: Impaired  Insight: Shallow   Executive Functions  Concentration: Poor  Attention Span: Poor  Recall: [employer] of Knowledge: Fair  Language: Fair   Psychomotor Activity  Psychomotor Activity: Psychomotor Activity: Normal  Musculoskeletal: Strength & Muscle Tone: decreased Gait & Station: unsteady Assets  Assets: Manufacturing systems engineer; Desire for Improvement; Physical Health    Physical Exam: Physical Exam Vitals and nursing note reviewed.  HENT:     Head: Normocephalic.     Nose: Nose normal.     Mouth/Throat:     Mouth: Mucous membranes are moist.  Eyes:      Pupils: Pupils are equal, round, and reactive to light.  Cardiovascular:     Rate and Rhythm: Normal rate.     Pulses: Normal pulses.  Pulmonary:     Breath sounds: Normal breath sounds.  Abdominal:     General: Bowel sounds are normal.  Neurological:     General: No focal deficit present.     Mental Status: He is alert.    Review of Systems  Constitutional:  Positive for malaise/fatigue.  HENT: Negative.    Eyes: Negative.   Cardiovascular: Negative.   Gastrointestinal: Negative.   Skin: Negative.   Blood pressure 121/71, pulse 78, temperature 98.2 F (36.8 C), resp. rate 18, height 6\' 1"  (1.854 m), weight 60.6 kg, SpO2 100%. Body mass index is 17.61 kg/m.  Diagnosis: Principal Problem:   MDD (major depressive disorder), recurrent severe, without psychosis ([name]) Active Problems:   GAD (generalized anxiety disorder)   Tremor   PLAN: Safety and Monitoring:  -- Voluntary admission to inpatient psychiatric unit for safety, stabilization and treatment  -- Daily contact with patient to assess and evaluate symptoms and progress in treatment  -- Patient's case to be discussed in multi-disciplinary team meeting  -- Observation Level : q15 minute checks  --  Vital signs:  q12 hours  -- Precautions: suicide, elopement, and assault -- Encouraged patient to participate in unit milieu and in scheduled group therapies  2. Psychiatric Diagnoses and Treatment:  Increased to patient's Effexor from 37.5 mg daily to 75 mg daily  Will discuss Abilify for mood stabilization given the intensity of depression  Home medications were discontinued during this admission due to inefficacy-Lexapro, Buspar, Remeron                    -- Metabolic profile and EKG monitoring (BMI: 17.61; Lipid Panel: HbgA1c: QTc: 498)     3. Medical Issues Being Addressed:   serotonin syndrome-resolved Unstable gait-PT OT involved Chronic fatigue-will check TSH and thyroid antibodies to rule out any autoimmune  problems  4. Discharge Planning:   -- Social work and case management to assist with discharge planning and identification of hospital follow-up needs prior to discharge  -- Estimated LOS: 3-4 days  Verner Chol, MD 04/25/2023, 4:35 PM

## 2023-04-25 NOTE — Plan of Care (Signed)
   Problem: Education: Goal: Knowledge of [facility] Education information/materials will improve Outcome: Progressing Goal: Emotional status will improve Outcome: Progressing Goal: Mental status will improve Outcome: Progressing

## 2023-04-25 NOTE — BH IP Treatment Plan (Signed)
 Interdisciplinary Treatment and Diagnostic Plan Update  04/25/2023 Time of Session: 9:00 AM  [name] MRN: [id number]  Principal Diagnosis: MDD (major depressive disorder), recurrent severe, without psychosis ([name])  Secondary Diagnoses: Principal Problem:   MDD (major depressive disorder), recurrent severe, without psychosis ([name]) Active Problems:   GAD (generalized anxiety disorder)   Tremor   Current Medications:  Current Facility-Administered Medications  Medication Dose Route Frequency Provider Last Rate Last Admin   acetaminophen (TYLENOL) tablet 650 mg  650 mg Oral Q6H PRN Motley-Mangrum, Jadeka A, PMHNP       alum & mag hydroxide-simeth (MAALOX/MYLANTA) 200-200-20 MG/5ML suspension 30 mL  30 mL Oral Q4H PRN Motley-Mangrum, Jadeka A, PMHNP       doxycycline (VIBRA-TABS) tablet 100 mg  100 mg Oral Q12H Floydene Flock, MD   100 mg at 04/25/23 9147   feeding supplement (ENSURE ENLIVE / ENSURE PLUS) liquid 237 mL  1 Bottle Oral TID BM Verner Chol, MD   237 mL at 04/25/23 0931   hydrOXYzine (ATARAX) tablet 25 mg  25 mg Oral TID PRN Motley-Mangrum, Ezra Sites, PMHNP   25 mg at 04/25/23 0125   liver oil-zinc oxide (DESITIN) 40 % ointment   Topical Q12H PRN Verner Chol, MD       LORazepam (ATIVAN) injection 1 mg  1 mg Intramuscular Q4H PRN Remington, Amber E, NP       LORazepam (ATIVAN) injection 2 mg  2 mg Intramuscular TID PRN Motley-Mangrum, Jadeka A, PMHNP       LORazepam (ATIVAN) tablet 1 mg  1 mg Oral Q4H PRN Remington, Amber E, NP   1 mg at 04/25/23 0931   magnesium hydroxide (MILK OF MAGNESIA) suspension 30 mL  30 mL Oral Daily PRN Motley-Mangrum, Jadeka A, PMHNP       multivitamin with minerals tablet 1 tablet  1 tablet Oral Daily Verner Chol, MD   1 tablet at 04/25/23 0928   ondansetron (ZOFRAN) injection 4 mg  4 mg Intravenous Q6H PRN Verner Chol, MD   4 mg at 04/16/23 0905   ondansetron (ZOFRAN-ODT) disintegrating tablet 4 mg  4 mg Oral Q8H PRN  Verner Chol, MD       predniSONE (DELTASONE) tablet 30 mg  30 mg Oral Q breakfast Motley-Mangrum, Jadeka A, PMHNP   30 mg at 04/25/23 8295   venlafaxine XR (EFFEXOR-XR) 24 hr capsule 37.5 mg  37.5 mg Oral Q breakfast Lewanda Rife, MD   37.5 mg at 04/25/23 6213   PTA Medications: Medications Prior to Admission  Medication Sig Dispense Refill Last Dose/Taking   busPIRone (BUSPAR) 10 MG tablet Take 1 tablet (10 mg total) by mouth 2 (two) times daily. 30 tablet 0 04/12/2023   dicyclomine (BENTYL) 10 MG capsule Take 1 capsule (10 mg total) by mouth 3 (three) times daily before meals. 90 capsule 0 Taking   escitalopram (LEXAPRO) 10 MG tablet Take 1 tablet (10 mg total) by mouth at bedtime. 30 tablet 0 04/13/2023   ketoconazole (NIZORAL) 2 % cream Apply 1 Application topically daily.   Taking   LORazepam (ATIVAN) 0.5 MG tablet Take 0.5 mg by mouth daily.   Taking   mirtazapine (REMERON) 30 MG tablet Take 1 tablet (30 mg total) by mouth at bedtime. 30 tablet 0 04/11/2023   predniSONE (DELTASONE) 20 MG tablet Take 1.5 tablets (30 mg total) by mouth daily with breakfast. 100 tablet 2 Past Month    Patient Stressors:    Patient Strengths:    Treatment  Modalities: Medication Management, Group therapy, Case management,  1 to 1 session with clinician, Psychoeducation, Recreational therapy.   Physician Treatment Plan for Primary Diagnosis: MDD (major depressive disorder), recurrent severe, without psychosis ([name]) Long Term Goal(s):     Short Term Goals:    Medication Management: Evaluate patient's response, side effects, and tolerance of medication regimen.  Therapeutic Interventions: 1 to 1 sessions, Unit Group sessions and Medication administration.  Evaluation of Outcomes: Progressing  Physician Treatment Plan for Secondary Diagnosis: Principal Problem:   MDD (major depressive disorder), recurrent severe, without psychosis ([name]) Active Problems:   GAD (generalized anxiety disorder)    Tremor  Long Term Goal(s):     Short Term Goals:       Medication Management: Evaluate patient's response, side effects, and tolerance of medication regimen.  Therapeutic Interventions: 1 to 1 sessions, Unit Group sessions and Medication administration.  Evaluation of Outcomes: Progressing   RN Treatment Plan for Primary Diagnosis: MDD (major depressive disorder), recurrent severe, without psychosis ([name]) Long Term Goal(s): Knowledge of disease and therapeutic regimen to maintain health will improve  Short Term Goals: Ability to remain free from injury will improve, Ability to verbalize frustration and anger appropriately will improve, Ability to demonstrate self-control, Ability to participate in decision making will improve, Ability to verbalize feelings will improve, Ability to disclose and discuss suicidal ideas, Ability to identify and develop effective coping behaviors will improve, and Compliance with prescribed medications will improve  Medication Management: RN will administer medications as ordered by provider, will assess and evaluate patient's response and provide education to patient for prescribed medication. RN will report any adverse and/or side effects to prescribing provider.  Therapeutic Interventions: 1 on 1 counseling sessions, Psychoeducation, Medication administration, Evaluate responses to treatment, Monitor vital signs and CBGs as ordered, Perform/monitor CIWA, COWS, AIMS and Fall Risk screenings as ordered, Perform wound care treatments as ordered.  Evaluation of Outcomes: Progressing   LCSW Treatment Plan for Primary Diagnosis: MDD (major depressive disorder), recurrent severe, without psychosis ([name]) Long Term Goal(s): Safe transition to appropriate next level of care at discharge, Engage patient in therapeutic group addressing interpersonal concerns.  Short Term Goals: Engage patient in aftercare planning with referrals and resources, Increase social support,  Increase ability to appropriately verbalize feelings, Increase emotional regulation, Facilitate acceptance of mental health diagnosis and concerns, Facilitate patient progression through stages of change regarding substance use diagnoses and concerns, Identify triggers associated with mental health/substance abuse issues, and Increase skills for wellness and recovery  Therapeutic Interventions: Assess for all discharge needs, 1 to 1 time with Social worker, Explore available resources and support systems, Assess for adequacy in community support network, Educate family and significant other(s) on suicide prevention, Complete Psychosocial Assessment, Interpersonal group therapy.  Evaluation of Outcomes: Progressing   Progress in Treatment: Attending groups: No. Participating in groups: No. Taking medication as prescribed: Yes. Toleration medication: Yes. Family/Significant other contact made: Yes, individual(s) contacted:  Feliberto Harts, brother  Patient understands diagnosis: Yes. Discussing patient identified problems/goals with staff: Yes. Medical problems stabilized or resolved: Yes. Denies suicidal/homicidal ideation: Yes. Issues/concerns per patient self-inventory: No. Other: None    New problem(s) identified: No, Describe:  None identified  Update 04/20/23: No changes at this time Update 04/25/23: No changes at this time    New Short Term/Long Term Goal(s): elimination of symptoms of psychosis, medication management for mood stabilization; elimination of SI thoughts; development of comprehensive mental wellness plan. Update 04/20/23: No changes at this time Update 04/25/23: No  changes at this time      Patient Goals: " I want to get control of my anxiety" Update 04/20/23: No changes at this time Update 04/25/23: No changes at this time      Discharge Plan or Barriers: CSW to assist with appropriate discharge planning Update 04/20/23: No changes at this time Update 04/25/23: Pt's brother is  assisting him with finding independent living facility. CSW will check with  pt's brother on progress at this time. Provider has suggested ECT as a possibility depression does not seem to be getting better    Reason for Continuation of Hospitalization: Anxiety Depression Medication stabilization   Estimated Length of Stay: 1 to 7 days Update 04/20/23: TBD Update 04/25/23: TBD  Last 3 [place] Suicide Severity Risk Score: Flowsheet Row Admission (Current) from 04/13/2023 in [facility] BE[facility]IORAL MEDICINE Most recent reading at 04/13/2023  7:45 PM ED from 04/13/2023 in [facility] Emergency Department at [facility] Most recent reading at 04/13/2023  4:16 AM ED from 04/11/2023 in [facility] Most recent reading at 04/11/2023  1:11 PM  C-SSRS RISK CATEGORY High Risk High Risk No Risk       Last PHQ 2/9 Scores:    06/06/2020   11:06 AM 03/06/2020    2:49 PM 08/28/2019    1:04 PM  Depression screen PHQ 2/9  Decreased Interest 0 0 0  Down, Depressed, Hopeless 0 1 1  PHQ - 2 Score 0 1 1  Altered sleeping 1 2 3   Tired, decreased energy 1 2 3   Change in appetite 1 1 1   Feeling bad or failure about yourself  0 0 0  Trouble concentrating 1 2 3   Moving slowly or fidgety/restless 1 0 0  Suicidal thoughts 0 0 0  PHQ-9 Score 5 8 11   Difficult doing work/chores   Somewhat difficult    Scribe for Treatment Team: Elza Rafter, [place] 04/25/2023 11:15 AM

## 2023-04-25 NOTE — Plan of Care (Signed)
 D: Pt alert and oriented. Pt reports experiencing anxiety/depression at this time. Pt denies experiencing any pain at this time. Pt denies experiencing any SI/HI, or AVH at this time.   A: Scheduled medications administered to pt, per MD orders. Support and encouragement provided. Frequent verbal contact made. Routine safety checks conducted q15 minutes.   R: No adverse drug reactions noted. Pt verbally contracts for safety at this time. Pt compliant with medications. Pt interacts well but minimally with others on the unit. Pt remains safe at this time. Plan of care ongoing.  Pt has walked with his walker from his room to the dayroom for lunch and recreational therapy group thus far today.    Problem: Education: Goal: Emotional status will improve Outcome: Not Progressing   Problem: Coping: Goal: Ability to verbalize frustrations and anger appropriately will improve Outcome: Progressing

## 2023-04-25 NOTE — Group Note (Signed)
 Recreation Therapy Group Note   Group Topic:General Recreation  Group Date: 04/25/2023 Start Time: 1400 End Time: 1500 Facilitators: Rosina Lowenstein, LRT, CTRS Location: Courtyard  Group Description: Outdoor Recreation. Patients had the option to play corn hole, ring toss, bowling or listening to music while outside in the courtyard getting fresh air and sunlight. LRT and patients discussed things that they enjoy doing in their free time outside of the hospital. LRT encouraged patients to drink water after being active and getting their heart rate up.   Goal Area(s) Addressed: Patient will identify leisure interests.  Patient will practice healthy decision making. Patient will engage in recreation activity.   Affect/Mood: N/A   Participation Level: Did not attend    Clinical Observations/Individualized Feedback: Patient did not attend group.   Plan: Continue to engage patient in RT group sessions 2-3x/week.   Rosina Lowenstein, LRT, CTRS 04/25/2023 4:55 PM

## 2023-04-26 DIAGNOSIS — F332 Major depressive disorder, recurrent severe without psychotic features: Secondary | ICD-10-CM | POA: Diagnosis not present

## 2023-04-26 MED ORDER — ARIPIPRAZOLE 5 MG PO TABS
5.0000 mg | ORAL_TABLET | Freq: Every day | ORAL | Status: DC
  Administered 2023-04-26 – 2023-05-19 (×24): 5 mg via ORAL
  Filled 2023-04-26 (×24): qty 1

## 2023-04-26 NOTE — Plan of Care (Signed)
 Marland Kitchen

## 2023-04-26 NOTE — Progress Notes (Signed)
 Occupational Therapy Treatment Patient Details Name: [name] MRN: [id number] DOB: [birth year] Today's Date: 04/26/2023   History of present illness [name] is a 58 year old Caucasian male with a history of MDD, anxiety, Crohn's disease, and chronic mesenteric ischemia who presented to ED on 04/13/23 at 0404 for suicide attempt by ingestion of 28 tablets of 10mg  Lexapro at 0200. Patient sent to behavior Med unit.   OT comments  Pt seen for OT tx this date. Pt laying in bed, endorsing anxiety is worse this afternoon. Notes he recently took medication to help and able to identify that not talking about it will help. Pt agreeable to engaging in meaningful grooming task that he previously reported he has been unable to do. At baseline pt reports he shaves daily and has not been able to since admitted. Pt sat in recliner in front of sink/mirror to shave his face using electric razor requiring supervision and only MIN A for thoroughness. Pt demo'd good safety awareness throughout. Pt returned to bed afterwards endorsing 8/10 rate of perceived exertion. Pt appreciative and even smiled, endorsing feeling better. Pt continues to benefit from skilled OT services.       If plan is discharge home, recommend the following:  A little help with walking and/or transfers;A little help with bathing/dressing/bathroom;Assistance with cooking/housework;Assist for transportation;Direct supervision/assist for medications management;Direct supervision/assist for financial management;Supervision due to cognitive status;Help with stairs or ramp for entrance   Equipment Recommendations  Other (comment) (2ww)    Recommendations for Other Services      Precautions / Restrictions Precautions Precautions: Fall Restrictions Weight Bearing Restrictions Per Provider Order: No       Mobility Bed Mobility Overal bed mobility: Modified Independent                  Transfers Overall transfer  level: Modified independent Equipment used: Rolling walker (2 wheels) Transfers: Sit to/from Stand                   Balance Overall balance assessment: Needs assistance Sitting-balance support: Feet supported Sitting balance-Leahy Scale: Good     Standing balance support: Bilateral upper extremity supported Standing balance-Leahy Scale: Fair                             ADL either performed or assessed with clinical judgement   ADL Overall ADL's : Needs assistance/impaired     Grooming: Sitting;Supervision/safety;Set up;Minimal assistance Grooming Details (indicate cue type and reason): Pt sat in recliner in front of sink/mirror to shave his face using electric razor requiring supervision and MIN A for thoroughness.                                    Extremity/Trunk Assessment              Vision       Perception     Praxis     Communication     Cognition Arousal: Alert Behavior During Therapy: Anxious Cognition: No family/caregiver present to determine baseline             OT - Cognition Comments: pt expressed that anxiety was hard for him but had taken medication recently and able to identify not talking about it as more helpful                 Following commands: Intact  Cueing      Exercises      Shoulder Instructions       General Comments      Pertinent Vitals/ Pain       Pain Assessment Pain Assessment: No/denies pain  Home Living                                          Prior Functioning/Environment              Frequency  Min 1X/week        Progress Toward Goals  OT Goals(current goals can now be found in the care plan section)  Progress towards OT goals: Progressing toward goals  Acute Rehab OT Goals Patient Stated Goal: work on getting better OT Goal Formulation: With patient Time For Goal Achievement: 05/02/23 Potential to Achieve Goals: Good   Plan      Co-evaluation                 AM-PAC OT "6 Clicks" Daily Activity     Outcome Measure   Help from another person eating meals?: None Help from another person taking care of personal grooming?: A Little Help from another person toileting, which includes using toliet, bedpan, or urinal?: A Little Help from another person bathing (including washing, rinsing, drying)?: A Little Help from another person to put on and taking off regular upper body clothing?: None Help from another person to put on and taking off regular lower body clothing?: A Little 6 Click Score: 20    End of Session    OT Visit Diagnosis: Other abnormalities of gait and mobility (R26.89);Muscle weakness (generalized) (M62.81)   Activity Tolerance Patient tolerated treatment well   Patient Left in bed;with call bell/phone within reach   Nurse Communication Other (comment) (shaved)        Time: 1610-9604 OT Time Calculation (min): 12 min  Charges: OT General Charges $OT Visit: 1 Visit OT Treatments $Self Care/Home Management : 8-22 mins  Arman Filter., MPH, MS, OTR/L ascom [phone] 04/26/23, 4:41 PM

## 2023-04-26 NOTE — Group Note (Signed)
 Date:  04/26/2023 Time:  9:45 PM  Group Topic/Focus:  Wrap-Up Group:   The focus of this group is to help patients review their daily goal of treatment and discuss progress on daily workbooks.    Participation Level:  Active  Participation Quality:  Appropriate  Affect:  Appropriate  Cognitive:  Appropriate  Insight: Appropriate  Engagement in Group:  Engaged  Modes of Intervention:  Discussion  Additional Comments:    [name] 04/26/2023, 9:45 PM

## 2023-04-26 NOTE — Progress Notes (Signed)
   04/26/23 1000  Psych Admission Type (Psych Patients Only)  Admission Status Involuntary  Psychosocial Assessment  Patient Complaints Anxiety;Depression;Worrying  Eye Contact Fair  Facial Expression Anxious;Sad;Worried  Affect Anxious;Depressed;Sad  Speech Soft  Interaction Isolative;Minimal  Motor Activity Unsteady  Appearance/Hygiene In scrubs  Behavior Characteristics Cooperative  Mood Depressed;Anxious;Sad  Thought Process  Coherency WDL  Content WDL  Delusions None reported or observed  Perception WDL  Hallucination None reported or observed  Judgment Impaired  Confusion None  Danger to Self  Current suicidal ideation? Denies  Agreement Not to Harm Self Yes  Description of Agreement verbal  Danger to Others  Danger to Others None reported or observed

## 2023-04-26 NOTE — Progress Notes (Signed)
 [facility] MD Progress Note  04/26/2023 2:26 PM [name]  MRN:  [id number]  [name] is a 58 year old Caucasian male with a history of MDD, anxiety, Crohn's disease, and chronic mesenteric ischemia who presented to [facility] emergency department on 04/13/23 at 0404 for suicide attempt by ingestion of 28 tablets of 10mg  Lexapro at 0200. Per ED documentation, patient had some mild symptoms of serotonin syndrome on arrival, including acute agitation and clonus, with symptomatic improvement with benzodiazepines. Did have a return of symptoms requiring additional dose of lorazepam. Poison control advised the total amount of ingested Lexapro likely is not enough to cause serotonin syndrome by itself and they recommended 8-hour cardiac monitoring. Qtc 498.  Subjective:  Chart reviewed, case discussed in multidisciplinary meeting, patient seen during rounds.  Today patient is noted to be working with the physical therapist on the unit with slow-paced walking and using a walker.  Patient reports doing fine today.  He reports suicidal thoughts at baseline but no active thoughts/intent/plan, denies HI/intent/plan.  He denies any command type auditory hallucinations and denies visual hallucinations.  He continues to express his fear about caring for self given his physical disability.  He talks about his Crohn's disease that has made him lose a lot of weight and muscle mass disable him.  He acknowledged that his brother and family is working on getting him some home health resources. Sleep: Fair  Appetite:  Fair  Past Psychiatric History: see h&P Family History:  Family History  Problem Relation Age of Onset   Heart disease Father    Thyroid cancer Father    Allergies Father    Clotting disorder Father    Breast cancer Mother    Stomach cancer Mother    Colon cancer Neg Hx    Esophageal cancer Neg Hx    Rectal cancer Neg Hx    Social History:  Social History   Substance and Sexual  Activity  Alcohol Use No   Alcohol/week: 0.0 standard drinks of alcohol     Social History   Substance and Sexual Activity  Drug Use Not Currently   Types: Cocaine, Marijuana   Comment: QUIT USING DRUGS IN 1997    Social History   Socioeconomic History   Marital status: Single    Spouse name: Not on file   Number of children: 1   Years of education: Not on file   Highest education level: Not on file  Occupational History   Occupation: Disability   Tobacco Use   Smoking status: Former    Current packs/day: 0.00    Average packs/day: 1 pack/day for 15.0 years (15.0 ttl pk-yrs)    Types: Cigarettes    Start date: 03/28/1982    Quit date: 03/28/1997    Years since quitting: 26.0   Smokeless tobacco: Never  Vaping Use   Vaping status: Never Used  Substance and Sexual Activity   Alcohol use: No    Alcohol/week: 0.0 standard drinks of alcohol   Drug use: Not Currently    Types: Cocaine, Marijuana    Comment: QUIT USING DRUGS IN 1997   Sexual activity: Not Currently  Other Topics Concern   Not on file  Social History Narrative   Single, history of substance abuse in recovery   Previously occupied on medical disability   1 child  Daughter Wellsite geologist, lives and works in [place]   Elderly parents involved and he helps them   1 brother   Social Drivers of Health  Financial Resource Strain: Low Risk  (08/28/2019)   Overall Financial Resource Strain (CARDIA)    Difficulty of Paying Living Expenses: Not hard at all  Food Insecurity: Food Insecurity Present (04/13/2023)   Hunger Vital Sign    Worried About Running Out of Food in the Last Year: Never true    Ran Out of Food in the Last Year: Sometimes true  Transportation Needs: No Transportation Needs (04/13/2023)   PRAPARE - Administrator, Civil Service (Medical): No    Lack of Transportation (Non-Medical): No  Physical Activity: Insufficiently Active (11/08/2019)   Exercise Vital Sign    Days of Exercise per  Week: 3 days    Minutes of Exercise per Session: 30 min  Stress: Stress Concern Present (08/28/2019)   [employer] of Occupational Health - Occupational Stress Questionnaire    Feeling of Stress : Very much  Social Connections: Socially Isolated (03/28/2023)   Social Connection and Isolation Panel [NHANES]    Frequency of Communication with Friends and Family: Three times a week    Frequency of Social Gatherings with Friends and Family: Twice a week    Attends Religious Services: Never    Database administrator or Organizations: No    Attends Engineer, structural: Never    Marital Status: Divorced   Past Medical History:  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 06/15/2018   Cancer of skin of neck    Cataract    Chronic mesenteric ischemia ([name])    Chronic pain syndrome 07/09/2016   Crohn's disease of small and large intestines ([name])    followed by dr Baldo Ash gessner   Dairy product intolerance    Diarrhea, functional    Family history of adverse reaction to anesthesia    Grover's disease    transient acantholytic dermatosis   History of alcohol abuse    History of basal cell carcinoma excision    2013 left leg   History of Clostridium difficile    10/ 2014   History of multiple concussions    x6   last one Jan 2017 per pt--  no residual   History of substance abuse ([name])    quit 1997 per pt   History of suicide attempt    05-18-2012  overdose /  failure to thrive   Iron deficiency anemia due to chronic blood loss    Major depression, recurrent, chronic ([name])    Osteopenia    Personal history of adenomatous colonic polyps 12/2010, 03/2012   12/2010 - 8 mm serrated adenoma of rectum   Portal vein thrombosis 03/21/2015   right    Primary sclerosing cholangitis    ? 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   Substance abuse ([name]) 1997   Alcohol   Vitamin A deficiency 05/23/2018   Vitamin B6 deficiency 07/27/2018   Vitamin C deficiency 05/23/2018    Past Surgical History:  Procedure Laterality Date   ABDOMINAL AORTAGRAM N/A 03/29/2012   Procedure: ABDOMINAL Ronny Flurry;  Surgeon: Nada Libman, MD;  Location: [facility] CATH LAB;  Service: Cardiovascular;  Laterality: N/A;   ADENOIDECTOMY  age 45  BIOPSY  05/31/2018   Procedure: BIOPSY;  Surgeon: Rachael Fee, MD;  Location: [facility];  Service: Endoscopy;;   CATARACT EXTRACTION W/ INTRAOCULAR LENS  IMPLANT, BILATERAL  2009   COLONOSCOPY  2001, 05/02/2003, 01/28/11   2012: Right colon Crohn's, rectal polyp   COLONOSCOPY  03/31/2012   Procedure: COLONOSCOPY;  Surgeon: Beverley Fiedler, MD;  Location: [facility] ENDOSCOPY;  Service: Gastroenterology;  Laterality: N/A;   COLONOSCOPY WITH PROPOFOL N/A 05/31/2018   Procedure: COLONOSCOPY WITH PROPOFOL;  Surgeon: Rachael Fee, MD;  Location: [facility] ENDOSCOPY;  Service: Endoscopy;  Laterality: N/A;   ESOPHAGOGASTRODUODENOSCOPY  01/28/2011   Normal   FOOT SURGERY Right age 60   MOHS SURGERY Left 11/2013   left ankle parakerotosis    PERCUTANEOUS LIVER BIOPSY  2007 and 2008   PILONIDAL CYST EXCISION  age 76   PLACEMENT OF SETON N/A 11/06/2015   Procedure: PLACEMENT OF SETON;  Surgeon: Romie Levee, MD;  Location: [facility];  Service: General;  Laterality: N/A;   PLACEMENT OF SETON  07/2018   at [facility]   RECTAL EXAM UNDER ANESTHESIA N/A 01/18/2019   Procedure: ANAL EXAM UNDER ANESTHESIA,  INCISION AND DRAINAGE, SETON PLACEMENT;  Surgeon: Romie Levee, MD;  Location: [facility];  Service: General;  Laterality: N/A;   UPPER GASTROINTESTINAL ENDOSCOPY      Current Medications: Current Facility-Administered Medications   Medication Dose Route Frequency Provider Last Rate Last Admin   acetaminophen (TYLENOL) tablet 650 mg  650 mg Oral Q6H PRN Motley-Mangrum, Jadeka A, PMHNP       alum & mag hydroxide-simeth (MAALOX/MYLANTA) 200-200-20 MG/5ML suspension 30 mL  30 mL Oral Q4H PRN Motley-Mangrum, Jadeka A, PMHNP       doxycycline (VIBRA-TABS) tablet 100 mg  100 mg Oral Q12H Floydene Flock, MD   100 mg at 04/26/23 1011   feeding supplement (ENSURE ENLIVE / ENSURE PLUS) liquid 237 mL  1 Bottle Oral TID BM Verner Chol, MD   1 Bottle at 04/26/23 1012   hydrOXYzine (ATARAX) tablet 25 mg  25 mg Oral TID PRN Motley-Mangrum, Ezra Sites, PMHNP   25 mg at 04/26/23 0631   liver oil-zinc oxide (DESITIN) 40 % ointment   Topical Q12H PRN Verner Chol, MD   Given at 04/25/23 1733   LORazepam (ATIVAN) injection 1 mg  1 mg Intramuscular Q4H PRN Remington, Amber E, NP       LORazepam (ATIVAN) injection 2 mg  2 mg Intramuscular TID PRN Motley-Mangrum, Geralynn Ochs A, PMHNP       LORazepam (ATIVAN) tablet 1 mg  1 mg Oral Q4H PRN Melanie Crazier, Amber E, NP   1 mg at 04/26/23 0016   magnesium hydroxide (MILK OF MAGNESIA) suspension 30 mL  30 mL Oral Daily PRN Motley-Mangrum, Jadeka A, PMHNP       multivitamin with minerals tablet 1 tablet  1 tablet Oral Daily Verner Chol, MD   1 tablet at 04/26/23 1011   ondansetron (ZOFRAN) injection 4 mg  4 mg Intravenous Q6H PRN Verner Chol, MD   4 mg at 04/16/23 0905   ondansetron (ZOFRAN-ODT) disintegrating tablet 4 mg  4 mg Oral Q8H PRN Verner Chol, MD       predniSONE (DELTASONE) tablet 30 mg  30 mg Oral Q breakfast Motley-Mangrum, Jadeka A, PMHNP   30 mg at 04/26/23 1011   venlafaxine XR (EFFEXOR-XR) 24 hr capsule 75 mg  75 mg Oral Q breakfast Verner Chol, MD   75 mg  at 04/26/23 1011    Lab Results:  No results found. However, due to the size of the patient record, not all encounters were searched. Please check Results Review for a complete set of results.   Blood Alcohol level:   Lab Results  Component Value Date   ETH <10 04/13/2023   ETH <10 03/28/2023    Metabolic Disorder Labs: Lab Results  Component Value Date   HGBA1C 4.8 04/03/2023   MPG 91.06 04/03/2023   No results found for: "PROLACTIN" Lab Results  Component Value Date   CHOL 209 (H) 04/03/2023   TRIG 75 04/03/2023   HDL 65 04/03/2023   CHOLHDL 3.2 04/03/2023   VLDL 15 04/03/2023   LDLCALC 129 (H) 04/03/2023    Physical Findings: AIMS:  , ,  ,  ,    CIWA:    COWS:      Psychiatric Specialty Exam:  Presentation  General Appearance:  Appropriate for Environment; Casual  Eye Contact: Minimal  Speech: Normal Rate  Speech Volume: Decreased    Mood and Affect  Mood: Dysphoric; Hopeless  Affect: Depressed; Flat   Thought Process  Thought Processes: Coherent  Descriptions of Associations:Intact  Orientation:Full (Time, Place and Person)  Thought Content:Illogical  Hallucinations:Hallucinations: None  Ideas of Reference:None  Suicidal Thoughts:Suicidal Thoughts: Yes, Passive SI Passive Intent and/or Plan: Without Intent; Without Plan  Homicidal Thoughts:Homicidal Thoughts: No   Sensorium  Memory: Immediate Fair; Recent Fair; Remote Fair  Judgment: Impaired  Insight: Shallow   Executive Functions  Concentration: Fair  Attention Span: Fair  Recall: Fair  Fund of Knowledge: Fair  Language: Fair   Psychomotor Activity  Psychomotor Activity: Psychomotor Activity: Normal  Musculoskeletal: Strength & Muscle Tone: decreased Gait & Station: unsteady Assets  Assets: Manufacturing systems engineer; Desire for Improvement; Social Support; Financial Resources/Insurance    Physical Exam: Physical Exam Vitals and nursing note reviewed.  HENT:     Head: Normocephalic.     Nose: Nose normal.     Mouth/Throat:     Mouth: Mucous membranes are moist.  Eyes:     Pupils: Pupils are equal, round, and reactive to light.  Cardiovascular:     Rate  and Rhythm: Normal rate.     Pulses: Normal pulses.  Pulmonary:     Breath sounds: Normal breath sounds.  Abdominal:     General: Bowel sounds are normal.  Neurological:     General: No focal deficit present.     Mental Status: He is alert.    Review of Systems  Constitutional:  Positive for malaise/fatigue.  HENT: Negative.    Eyes: Negative.   Cardiovascular: Negative.   Gastrointestinal: Negative.   Skin: Negative.   Blood pressure 133/76, pulse 68, temperature 97.9 F (36.6 C), resp. rate 15, height 6\' 1"  (1.854 m), weight 60.6 kg, SpO2 99%. Body mass index is 17.61 kg/m.  Diagnosis: Principal Problem:   MDD (major depressive disorder), recurrent severe, without psychosis ([name]) Active Problems:   GAD (generalized anxiety disorder)   Tremor   PLAN: Safety and Monitoring:  -- Voluntary admission to inpatient psychiatric unit for safety, stabilization and treatment  -- Daily contact with patient to assess and evaluate symptoms and progress in treatment  -- Patient's case to be discussed in multi-disciplinary team meeting  -- Observation Level : q15 minute checks  -- Vital signs:  q12 hours  -- Precautions: suicide, elopement, and assault -- Encouraged patient to participate in unit milieu and in scheduled group therapies  2. Psychiatric  Diagnoses and Treatment:  Increased to patient's Effexor from 37.5 mg daily to 75 mg daily  Will discuss Abilify 5 mg at bedtime for mood stabilization given the intensity of depression  Home medications were discontinued during this admission due to inefficacy-Lexapro, Buspar, Remeron                    -- Metabolic profile W0J 4.8; LDL 811; T Cholesterol 209  EKG 04/26/2023 normal sinus rhythm with QTc 423   3. Medical Issues Being Addressed:   serotonin syndrome-resolved Unstable gait-PT OT involved Chronic fatigue-will check TSH and thyroid antibodies to rule out any autoimmune problems  4. Discharge Planning:   -- Social work  and case management to assist with discharge planning and identification of hospital follow-up needs prior to discharge  -- Estimated LOS: 3-4 days  Verner Chol, MD 04/26/2023, 2:26 PM

## 2023-04-26 NOTE — Progress Notes (Signed)
   04/26/23 2000  Psych Admission Type (Psych Patients Only)  Admission Status Involuntary  Psychosocial Assessment  Eye Contact Fair  Facial Expression Flat  Affect Depressed  Speech Soft  Interaction Minimal  Motor Activity Unsteady  Appearance/Hygiene In scrubs  Behavior Characteristics Cooperative  Mood Depressed;Anxious;Sad  Thought Process  Coherency WDL  Content WDL  Delusions None reported or observed  Perception WDL  Hallucination None reported or observed  Judgment Limited  Confusion None  Danger to Self  Current suicidal ideation? Passive  Agreement Not to Harm Self Yes  Description of Agreement verbal  Danger to Others  Danger to Others None reported or observed

## 2023-04-26 NOTE — Progress Notes (Signed)
 Physical Therapy Treatment Patient Details Name: [name] MRN: [id number] DOB: 1965/06/11 Today's Date: 04/26/2023   History of Present Illness Leopold Smyers is a 58 year old Caucasian male with a history of MDD, anxiety, Crohn's disease, and chronic mesenteric ischemia who presented to ED on 04/13/23 at 0404 for suicide attempt by ingestion of 28 tablets of 10mg  Lexapro at 0200. Patient sent to behavior Med unit.    PT Comments  Pt in bed,  tremors he stated is anxiety.  He is able to get in/out of bed on his own.  Stands to RW with supervision and walks to bathroom to void.  Opts to stand and is able to manage on his own but stated he did feel dizzy and needed to sit down as he felt he may fall.  He is able to progress gait today 120' x 2 with RW and cga x 1.  He is motivated to attempt a second gait trial and despite fatigue he does well but decreased gait quality and speed are noted.  Pt encouraged to take standing rest but stated "If I stop now I might not be able to start again"  he does make it safety to bed.  Encouraged to spend more time out of bed at least in his recliner.  Stated the TV on in dayroom bothers him and he prefers the quiet.  Encouraged outside time today if available.    If plan is discharge home, recommend the following: A little help with walking and/or transfers;A little help with bathing/dressing/bathroom;Assist for transportation;Help with stairs or ramp for entrance;Assistance with cooking/housework;Direct supervision/assist for financial management;Direct supervision/assist for medications management   Can travel by private vehicle        Equipment Recommendations  Rolling walker (2 wheels)    Recommendations for Other Services       Precautions / Restrictions Precautions Precautions: Fall Precaution/Restrictions Comments: intermittent dizziness Restrictions Weight Bearing Restrictions Per Provider Order: No     Mobility  Bed  Mobility Overal bed mobility: Modified Independent               Patient Response: Cooperative  Transfers Overall transfer level: Modified independent Equipment used: Rolling walker (2 wheels) Transfers: Sit to/from Stand Sit to Stand: Modified independent (Device/Increase time)                Ambulation/Gait Ambulation/Gait assistance: Supervision, Contact guard assist Gait Distance (Feet): 120 Feet Assistive device: Rolling walker (2 wheels) Gait Pattern/deviations: Decreased step length - right, Decreased step length - left, Decreased stride length, Decreased dorsiflexion - right, Decreased dorsiflexion - left, Trunk flexed Gait velocity: decr     General Gait Details: 120' x 2 today with motivation to try second attempt.   Stairs             Wheelchair Mobility     Tilt Bed Tilt Bed Patient Response: Cooperative  Modified Rankin (Stroke Patients Only)       Balance Overall balance assessment: Needs assistance Sitting-balance support: Feet supported Sitting balance-Leahy Scale: Good     Standing balance support: Bilateral upper extremity supported Standing balance-Leahy Scale: Fair Standing balance comment: does fair static but quality decreases with dynamic tasks                            Communication    Cognition Arousal: Alert Behavior During Therapy: Flat affect   PT - Cognitive impairments: No apparent impairments  PT - Cognition Comments: a bit more engaged today        Cueing    Exercises      General Comments        Pertinent Vitals/Pain Pain Assessment Pain Assessment: No/denies pain    Home Living                          Prior Function            PT Goals (current goals can now be found in the care plan section) Progress towards PT goals: Progressing toward goals    Frequency    Min 1X/week      PT Plan      Co-evaluation               AM-PAC PT "6 Clicks" Mobility   Outcome Measure  Help needed turning from your back to your side while in a flat bed without using bedrails?: None Help needed moving from lying on your back to sitting on the side of a flat bed without using bedrails?: None Help needed moving to and from a bed to a chair (including a wheelchair)?: A Little Help needed standing up from a chair using your arms (e.g., wheelchair or bedside chair)?: A Little Help needed to walk in hospital room?: A Little Help needed climbing 3-5 steps with a railing? : A Little 6 Click Score: 20    End of Session Equipment Utilized During Treatment: Gait belt Activity Tolerance: Patient limited by fatigue;Patient tolerated treatment well Patient left: in bed Nurse Communication: Mobility status PT Visit Diagnosis: Muscle weakness (generalized) (M62.81);Difficulty in walking, not elsewhere classified (R26.2)     Time: 4098-1191 PT Time Calculation (min) (ACUTE ONLY): 13 min  Charges:    $Gait Training: 8-22 mins PT General Charges $$ ACUTE PT VISIT: 1 Visit                   Danielle Dess, PTA 04/26/23, 11:32 AM

## 2023-04-26 NOTE — Plan of Care (Signed)
   Problem: Education: Goal: Knowledge of [name] Education information/materials will improve Outcome: Progressing Goal: Emotional status will improve Outcome: Progressing Goal: Mental status will improve Outcome: Progressing Goal: Verbalization of understanding the information provided will improve Outcome: Progressing   Problem: Activity: Goal: Interest or engagement in activities will improve Outcome: Progressing Goal: Sleeping patterns will improve Outcome: Progressing   Problem: Coping: Goal: Ability to verbalize frustrations and anger appropriately will improve Outcome: Progressing Goal: Ability to demonstrate self-control will improve Outcome: Progressing

## 2023-04-26 NOTE — BHH Counselor (Signed)
 CSW spoke with pt's brother, Ashtin Rosner who feels pt is NOT at baseline and wants recommendations for further treatment for pt, such as ACT.   CSW will submit ACT referrals as requested.   Want recommendations for more treatment. Reynaldo Minium, [name], [place] 05/05/2023 3:11 PM

## 2023-04-26 NOTE — Group Note (Signed)
 Recreation Therapy Group Note   Group Topic:Communication  Group Date: 04/26/2023 Start Time: 1330 End Time: 1430 Facilitators: Rosina Lowenstein, LRT, CTRS Location: Courtyard  Group Description: Emotional Check in. Patient sat and talked with LRT about how they are doing and whatever else is on their mind. LRT provided active listening, reassurance and encouragement. Pts were given the opportunity to listen to music or color mandalas while they talk.    Goal Area(s) Addressed: Patient will engage in conversation with LRT. Patient will communicate their wants, needs, or questions.  Patient will practice a new coping skill of "talking to someone".   Affect/Mood: N/A   Participation Level: Did not attend    Clinical Observations/Individualized Feedback: Patient did not attend group.   Plan: Continue to engage patient in RT group sessions 2-3x/week.   Rosina Lowenstein, LRT, CTRS 04/26/2023 4:34 PM

## 2023-04-27 DIAGNOSIS — F332 Major depressive disorder, recurrent severe without psychotic features: Secondary | ICD-10-CM | POA: Diagnosis not present

## 2023-04-27 LAB — THYROID ANTIBODIES (THYROPEROXIDASE & THYROGLOBULIN)
Thyroglobulin Antibody: <1 [IU]/mL (ref 0.0–0.9)
Thyroperoxidase Ab SerPl-aCnc: <9 [IU]/mL (ref 0–34)

## 2023-04-27 LAB — THYROID PANEL WITH TSH
Free Thyroxine Index: 1.4 (ref 1.2–4.9)
T3 Uptake Ratio: 29 % (ref 24–39)
T4, Total: 4.9 ug/dL (ref 4.5–12.0)
TSH: 2.64 u[IU]/mL (ref 0.450–4.500)

## 2023-04-27 NOTE — Group Note (Signed)
 Date:  04/27/2023 Time:  9:00 PM  Group Topic/Focus:  Wrap-Up Group:   The focus of this group is to help patients review their daily goal of treatment and discuss progress on daily workbooks.    Participation Level:  Did Not Attend  Lenore Cordia 04/27/2023, 9:00 PM

## 2023-04-27 NOTE — Progress Notes (Signed)
   04/27/23 1800  Psych Admission Type (Psych Patients Only)  Admission Status Involuntary  Psychosocial Assessment  Patient Complaints Anxiety;Depression  Eye Contact Fair  Facial Expression Flat  Affect Depressed  Speech Soft  Interaction Minimal  Motor Activity Unsteady  Appearance/Hygiene In scrubs  Behavior Characteristics Cooperative  Mood Depressed;Anxious;Sad  Thought Process  Coherency WDL  Content WDL  Delusions None reported or observed  Perception WDL  Hallucination None reported or observed  Judgment Limited  Confusion Moderate  Danger to Self  Current suicidal ideation? Passive  Agreement Not to Harm Self Yes  Description of Agreement verbal  Danger to Others  Danger to Others None reported or observed

## 2023-04-27 NOTE — Progress Notes (Signed)
 [facility] MD Progress Note  04/27/2023 10:26 AM [name]  MRN:  [id number]  [name] is a 58 year old Caucasian male with a history of MDD, anxiety, Crohn's disease, and chronic mesenteric ischemia who presented to [facility] emergency department on 04/13/23 at 0404 for suicide attempt by ingestion of 28 tablets of 10mg  Lexapro at 0200. Per ED documentation, patient had some mild symptoms of serotonin syndrome on arrival, including acute agitation and clonus, with symptomatic improvement with benzodiazepines. Did have a return of symptoms requiring additional dose of lorazepam. Poison control advised the total amount of ingested Lexapro likely is not enough to cause serotonin syndrome by itself and they recommended 8-hour cardiac monitoring. Qtc 498.  Subjective:  Chart reviewed, case discussed in multidisciplinary meeting, patient seen during rounds.  Today on interview patient is noted to be resting in bed.  He reports that his sleep was disturbed last night with some noises but slept through the morning eventually.  He reports having low energy and low motivation.  He reports that he feels better if he has some help in managing his medications once he goes home.  He did not endorse any active SI/HI/intent plan.  He denies auditory/visual hallucinations.  He reports that the thoughts of wanting to die linger around in the context of feeling helpless and hopeless with his physical disability.  He reports he is happy to hear that there is a Child psychotherapist and had his brother trying to get him some home health resources.  Provider and patient discussed about adding Abilify as a mood stabilizer and discussed all the side effects including akathisia.  Patient reports that he is going to let the provider know if he notices any side effects and is happy to take the Abilify.  Appetite:  Fair  Past Psychiatric History: see h&P Family History:  Family History  Problem Relation Age of Onset   Heart  disease Father    Thyroid cancer Father    Allergies Father    Clotting disorder Father    Breast cancer Mother    Stomach cancer Mother    Colon cancer Neg Hx    Esophageal cancer Neg Hx    Rectal cancer Neg Hx    Social History:  Social History   Substance and Sexual Activity  Alcohol Use No   Alcohol/week: 0.0 standard drinks of alcohol     Social History   Substance and Sexual Activity  Drug Use Not Currently   Types: Cocaine, Marijuana   Comment: QUIT USING DRUGS IN 1997    Social History   Socioeconomic History   Marital status: Single    Spouse name: Not on file   Number of children: 1   Years of education: Not on file   Highest education level: Not on file  Occupational History   Occupation: Disability   Tobacco Use   Smoking status: Former    Current packs/day: 0.00    Average packs/day: 1 pack/day for 15.0 years (15.0 ttl pk-yrs)    Types: Cigarettes    Start date: 03/28/1982    Quit date: 03/28/1997    Years since quitting: 26.0   Smokeless tobacco: Never  Vaping Use   Vaping status: Never Used  Substance and Sexual Activity   Alcohol use: No    Alcohol/week: 0.0 standard drinks of alcohol   Drug use: Not Currently    Types: Cocaine, Marijuana    Comment: QUIT USING DRUGS IN 1997   Sexual activity: Not Currently  Other Topics Concern   Not on file  Social History Narrative   Single, history of substance abuse in recovery   Previously occupied on medical disability   1 child  Daughter Wellsite geologist, lives and works in [place]   Elderly parents involved and he helps them   1 brother   Social Drivers of Corporate investment banker Strain: Low Risk  (08/28/2019)   Overall Financial Resource Strain (CARDIA)    Difficulty of Paying Living Expenses: Not hard at all  Food Insecurity: Food Insecurity Present (04/13/2023)   Hunger Vital Sign    Worried About Running Out of Food in the Last Year: Never true    Ran Out of Food in the Last Year: Sometimes  true  Transportation Needs: No Transportation Needs (04/13/2023)   PRAPARE - Administrator, Civil Service (Medical): No    Lack of Transportation (Non-Medical): No  Physical Activity: Insufficiently Active (11/08/2019)   Exercise Vital Sign    Days of Exercise per Week: 3 days    Minutes of Exercise per Session: 30 min  Stress: Stress Concern Present (08/28/2019)   [employer] of Occupational Health - Occupational Stress Questionnaire    Feeling of Stress : Very much  Social Connections: Socially Isolated (03/28/2023)   Social Connection and Isolation Panel [NHANES]    Frequency of Communication with Friends and Family: Three times a week    Frequency of Social Gatherings with Friends and Family: Twice a week    Attends Religious Services: Never    Database administrator or Organizations: No    Attends Engineer, structural: Never    Marital Status: Divorced   Past Medical History:  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 06/15/2018   Cancer of skin of neck    Cataract    Chronic mesenteric ischemia ([name])    Chronic pain syndrome 07/09/2016   Crohn's disease of small and large intestines ([name])    followed by dr Baldo Ash gessner   Dairy product intolerance    Diarrhea, functional    Family history of adverse reaction to anesthesia    Grover's disease    transient acantholytic dermatosis   History of alcohol abuse    History of basal cell carcinoma excision    2013 left leg   History of Clostridium difficile    10/ 2014   History of multiple concussions    x6   last one Jan 2017 per pt--  no residual   History of substance abuse ([name])    quit 1997 per pt   History of suicide attempt    05-18-2012  overdose /  failure to  thrive   Iron deficiency anemia due to chronic blood loss    Major depression, recurrent, chronic ([name])    Osteopenia    Personal history of adenomatous colonic polyps 12/2010, 03/2012   12/2010 - 8 mm serrated adenoma of rectum   Portal vein thrombosis 03/21/2015   right   Primary sclerosing cholangitis    ? 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   Substance abuse ([name]) 1997   Alcohol  Vitamin A deficiency 05/23/2018   Vitamin B6 deficiency 07/27/2018   Vitamin C deficiency 05/23/2018    Past Surgical History:  Procedure Laterality Date   ABDOMINAL AORTAGRAM N/A 03/29/2012   Procedure: ABDOMINAL AORTAGRAM;  Surgeon: Nada Libman, MD;  Location: [facility] CATH LAB;  Service: Cardiovascular;  Laterality: N/A;   ADENOIDECTOMY  age 36   BIOPSY  05/31/2018   Procedure: BIOPSY;  Surgeon: Rachael Fee, MD;  Location: [facility] ENDOSCOPY;  Service: Endoscopy;;   CATARACT EXTRACTION W/ INTRAOCULAR LENS  IMPLANT, BILATERAL  2009   COLONOSCOPY  2001, 05/02/2003, 01/28/11   2012: Right colon Crohn's, rectal polyp   COLONOSCOPY  03/31/2012   Procedure: COLONOSCOPY;  Surgeon: Beverley Fiedler, MD;  Location: [facility] ENDOSCOPY;  Service: Gastroenterology;  Laterality: N/A;   COLONOSCOPY WITH PROPOFOL N/A 05/31/2018   Procedure: COLONOSCOPY WITH PROPOFOL;  Surgeon: Rachael Fee, MD;  Location: [facility] ENDOSCOPY;  Service: Endoscopy;  Laterality: N/A;   ESOPHAGOGASTRODUODENOSCOPY  01/28/2011   Normal   FOOT SURGERY Right age 55   MOHS SURGERY Left 11/2013   left ankle parakerotosis    PERCUTANEOUS LIVER BIOPSY  2007 and 2008   PILONIDAL CYST EXCISION  age 4   PLACEMENT OF SETON N/A 11/06/2015   Procedure: PLACEMENT OF SETON;  Surgeon: Romie Levee, MD;  Location: [facility];  Service: General;  Laterality: N/A;   PLACEMENT OF SETON  07/2018   at [facility]   RECTAL EXAM UNDER ANESTHESIA N/A 01/18/2019   Procedure: ANAL EXAM  UNDER ANESTHESIA,  INCISION AND DRAINAGE, SETON PLACEMENT;  Surgeon: Romie Levee, MD;  Location: [facility];  Service: General;  Laterality: N/A;   UPPER GASTROINTESTINAL ENDOSCOPY      Current Medications: Current Facility-Administered Medications  Medication Dose Route Frequency Provider Last Rate Last Admin   acetaminophen (TYLENOL) tablet 650 mg  650 mg Oral Q6H PRN Motley-Mangrum, Jadeka A, PMHNP       alum & mag hydroxide-simeth (MAALOX/MYLANTA) 200-200-20 MG/5ML suspension 30 mL  30 mL Oral Q4H PRN Motley-Mangrum, Jadeka A, PMHNP       ARIPiprazole (ABILIFY) tablet 5 mg  5 mg Oral QHS Verner Chol, MD   5 mg at 04/26/23 2104   doxycycline (VIBRA-TABS) tablet 100 mg  100 mg Oral Q12H Floydene Flock, MD   100 mg at 04/26/23 2104   feeding supplement (ENSURE ENLIVE / ENSURE PLUS) liquid 237 mL  1 Bottle Oral TID BM Verner Chol, MD   237 mL at 04/26/23 1400   hydrOXYzine (ATARAX) tablet 25 mg  25 mg Oral TID PRN Motley-Mangrum, Ezra Sites, PMHNP   25 mg at 04/26/23 2104   liver oil-zinc oxide (DESITIN) 40 % ointment   Topical Q12H PRN Verner Chol, MD   Given at 04/25/23 1733   LORazepam (ATIVAN) injection 1 mg  1 mg Intramuscular Q4H PRN Remington, Amber E, NP       LORazepam (ATIVAN) injection 2 mg  2 mg Intramuscular TID PRN Motley-Mangrum, Geralynn Ochs A, PMHNP       LORazepam (ATIVAN) tablet 1 mg  1 mg Oral Q4H PRN Remington, Amber E, NP   1 mg at 04/27/23 0356   magnesium hydroxide (MILK OF MAGNESIA) suspension 30 mL  30 mL Oral Daily PRN Motley-Mangrum, Jadeka A, PMHNP       multivitamin with minerals tablet 1 tablet  1 tablet Oral Daily Verner Chol, MD   1 tablet at 04/26/23 1011   ondansetron (ZOFRAN) injection 4 mg  4 mg Intravenous Q6H PRN Verner Chol, MD   4 mg at 04/16/23 0905   ondansetron (ZOFRAN-ODT) disintegrating tablet 4 mg  4 mg Oral Q8H PRN Verner Chol, MD       predniSONE (DELTASONE) tablet 30 mg  30 mg Oral Q breakfast Motley-Mangrum,  Jadeka A, PMHNP   30 mg at 04/26/23 1011   venlafaxine XR (EFFEXOR-XR) 24 hr capsule 75 mg  75 mg Oral Q breakfast Verner Chol, MD   75 mg at 04/26/23 1011    Lab Results:  Results for orders placed or performed during the hospital encounter of 04/13/23 (from the past 48 hours)  Thyroid Panel With TSH     Status: None   Collection Time: 04/26/23  6:33 AM  Result Value Ref Range   TSH 2.640 0.450 - 4.500 uIU/mL   T4, Total 4.9 4.5 - 12.0 ug/dL   T3 Uptake Ratio 29 24 - 39 %   Free Thyroxine Index 1.4 1.2 - 4.9    Comment: (NOTE) Performed At: [facility] [address] [place], [place] [id number] Jolene Schimke MD WU:[id number]    *Note: Due to a large number of results and/or encounters for the requested time period, some results have not been displayed. A complete set of results can be found in Results Review.     Blood Alcohol level:  Lab Results  Component Value Date   ETH <10 04/13/2023   ETH <10 03/28/2023    Metabolic Disorder Labs: Lab Results  Component Value Date   HGBA1C 4.8 04/03/2023   MPG 91.06 04/03/2023   No results found for: "PROLACTIN" Lab Results  Component Value Date   CHOL 209 (H) 04/03/2023   TRIG 75 04/03/2023   HDL 65 04/03/2023   CHOLHDL 3.2 04/03/2023   VLDL 15 04/03/2023   LDLCALC 129 (H) 04/03/2023    Physical Findings: AIMS:  , ,  ,  ,    CIWA:    COWS:      Psychiatric Specialty Exam:  Presentation  General Appearance:  Appropriate for Environment; Casual  Eye Contact: Fair  Speech: Clear and Coherent  Speech Volume: Decreased    Mood and Affect  Mood: Depressed  Affect: Depressed   Thought Process  Thought Processes: Coherent  Descriptions of Associations:Intact  Orientation:Full (Time, Place and Person)  Thought Content:Logical  Hallucinations:Hallucinations: Other (comment)  Ideas of Reference:None  Suicidal Thoughts:Suicidal Thoughts: Yes, Passive SI Passive Intent and/or Plan:  Without Intent; Without Plan  Homicidal Thoughts:Homicidal Thoughts: No   Sensorium  Memory: Immediate Fair; Recent Fair; Remote Fair  Judgment: Impaired  Insight: Shallow   Executive Functions  Concentration: Fair  Attention Span: Fair  Recall: Fair  Fund of Knowledge: Fair  Language: Fair   Psychomotor Activity  Psychomotor Activity: Psychomotor Activity: Normal  Musculoskeletal: Strength & Muscle Tone: decreased Gait & Station: unsteady Assets  Assets: Manufacturing systems engineer; Desire for Improvement; Resilience    Physical Exam: Physical Exam Vitals and nursing note reviewed.  HENT:     Head: Normocephalic.     Nose: Nose normal.     Mouth/Throat:     Mouth: Mucous membranes are moist.  Eyes:     Pupils: Pupils are equal, round, and reactive to light.  Cardiovascular:     Rate and Rhythm: Normal rate.     Pulses: Normal pulses.  Pulmonary:     Breath sounds: Normal breath sounds.  Abdominal:     General: Bowel sounds are normal.  Neurological:  General: No focal deficit present.     Mental Status: He is alert.    Review of Systems  Constitutional:  Positive for malaise/fatigue.  HENT: Negative.    Eyes: Negative.   Cardiovascular: Negative.   Gastrointestinal: Negative.   Skin: Negative.   Blood pressure 125/71, pulse 65, temperature 97.8 F (36.6 C), resp. rate 18, height 6\' 1"  (1.854 m), weight 60.6 kg, SpO2 100%. Body mass index is 17.61 kg/m.  Diagnosis: Principal Problem:   MDD (major depressive disorder), recurrent severe, without psychosis ([name]) Active Problems:   GAD (generalized anxiety disorder)   Tremor   PLAN: Safety and Monitoring:  -- Voluntary admission to inpatient psychiatric unit for safety, stabilization and treatment  -- Daily contact with patient to assess and evaluate symptoms and progress in treatment  -- Patient's case to be discussed in multi-disciplinary team meeting  -- Observation Level : q15  minute checks  -- Vital signs:  q12 hours  -- Precautions: suicide, elopement, and assault -- Encouraged patient to participate in unit milieu and in scheduled group therapies  2. Psychiatric Diagnoses and Treatment:  Increased to patient's Effexor from 37.5 mg daily to 75 mg daily  Discussed Abilify 5 mg at bedtime for mood stabilization given the intensity of depression.  Discussed thoroughly the side effects risk benefits of Abilify and patient gave consent for the medication.  Home medications were discontinued during this admission due to inefficacy-Lexapro, Buspar, Remeron                    -- Metabolic profile Z6X 4.8; LDL 096; T Cholesterol 209  EKG 04/26/2023 normal sinus rhythm with QTc 423   3. Medical Issues Being Addressed:   serotonin syndrome-resolved Unstable gait-PT OT involved Chronic fatigue-will check TSH and thyroid antibodies to rule out any autoimmune problems  4. Discharge Planning:   -- Social work and case management to assist with discharge planning and identification of hospital follow-up needs prior to discharge  -- Estimated LOS: 3-4 days  Verner Chol, MD 04/27/2023, 10:26 AM

## 2023-04-27 NOTE — Plan of Care (Signed)

## 2023-04-27 NOTE — Plan of Care (Signed)
 ?  Problem: Education: ?Goal: Mental status will improve ?Outcome: Progressing ?Goal: Verbalization of understanding the information provided will improve ?Outcome: Progressing ?  ?

## 2023-04-28 DIAGNOSIS — F332 Major depressive disorder, recurrent severe without psychotic features: Secondary | ICD-10-CM | POA: Diagnosis not present

## 2023-04-28 NOTE — Plan of Care (Signed)
   04/27/23 2300  Psych Admission Type (Psych Patients Only)  Admission Status Involuntary  Psychosocial Assessment  Patient Complaints Depression  Eye Contact Fair  Facial Expression Flat  Affect Depressed  Speech Soft  Interaction Minimal  Motor Activity Unsteady  Appearance/Hygiene In scrubs  Behavior Characteristics Cooperative  Mood Depressed;Sad  Thought Process  Coherency WDL  Content WDL  Delusions None reported or observed  Perception WDL  Hallucination None reported or observed  Judgment Limited  Confusion WDL  Danger to Self  Current suicidal ideation? Denies  Danger to Others  Danger to Others None reported or observed     04/28/23 0600  15 Minute Checks  Location Bedroom  Visual Appearance Calm  Behavior Sleeping  Sleep (Behavioral Health Patients Only)  Calculate sleep? (Click Yes once per 24 hr at 0600 safety check) Yes  Documented sleep last 24 hours 11    Problem: Education: Goal: Knowledge of [facility] Education information/materials will improve Outcome: Progressing Goal: Emotional status will improve Outcome: Progressing Goal: Mental status will improve Outcome: Progressing Goal: Verbalization of understanding the information provided will improve Outcome: Progressing

## 2023-04-28 NOTE — Progress Notes (Signed)
 Physical Therapy Treatment Patient Details Name: [name] MRN: [id number] DOB: 17-Dec-1965 Today's Date: 04/28/2023   History of Present Illness [name] is a 58 year old Caucasian male with a history of MDD, anxiety, Crohn's disease, and chronic mesenteric ischemia who presented to ED on 04/13/23 at 0404 for suicide attempt by ingestion of 28 tablets of 10mg  Lexapro at 0200. Patient sent to behavior Med unit.    PT Comments  Stated he is walking to/from dining for meals with staff now with RW.  Bed mobility and transfers mod I but still benefits from +1 for supervision with gait due to general weakness but has no LOB or buckling.  Walks 150' and 93' with stop in chair in hallway for 5x sit to stand before returning to room to void and to do ADL's at sink.  Self limits activity and opts to return to bed but stated "thank you for trying to make me better."  No tremors today as noted in prior sessions.   If plan is discharge home, recommend the following: A little help with walking and/or transfers;A little help with bathing/dressing/bathroom;Assist for transportation;Help with stairs or ramp for entrance;Assistance with cooking/housework;Direct supervision/assist for financial management;Direct supervision/assist for medications management   Can travel by private vehicle        Equipment Recommendations  Rolling walker (2 wheels)    Recommendations for Other Services       Precautions / Restrictions Precautions Precautions: Fall Precaution/Restrictions Comments: intermittent dizziness Restrictions Weight Bearing Restrictions Per Provider Order: No     Mobility  Bed Mobility Overal bed mobility: Modified Independent               Patient Response: Cooperative  Transfers Overall transfer level: Modified independent     Sit to Stand: Modified independent (Device/Increase time)                Ambulation/Gait Ambulation/Gait assistance:  Supervision Gait Distance (Feet): 150 Feet Assistive device: Rolling walker (2 wheels) Gait Pattern/deviations: Decreased step length - right, Decreased step length - left, Decreased stride length, Decreased dorsiflexion - right, Decreased dorsiflexion - left, Trunk flexed Gait velocity: decr     General Gait Details: 150' 80'   Stairs             Wheelchair Mobility     Tilt Bed Tilt Bed Patient Response: Cooperative  Modified Rankin (Stroke Patients Only)       Balance Overall balance assessment: Mild deficits observed, not formally tested                                          Communication    Cognition Arousal: Alert Behavior During Therapy: WFL for tasks assessed/performed   PT - Cognitive impairments: No apparent impairments                       PT - Cognition Comments: enaged and overall less anxious today with no tremors noted        Cueing    Exercises Other Exercises Other Exercises: 5x sit to stand    General Comments        Pertinent Vitals/Pain Pain Assessment Faces Pain Scale: Hurts even more Pain Location: boil on bottom being treated by wound nurse per pt. Pain Descriptors / Indicators: Sore Pain Intervention(s): Monitored during session, Limited activity within patient's tolerance    Home  Living                          Prior Function            PT Goals (current goals can now be found in the care plan section) Progress towards PT goals: Progressing toward goals    Frequency    Min 1X/week      PT Plan      Co-evaluation              AM-PAC PT "6 Clicks" Mobility   Outcome Measure  Help needed turning from your back to your side while in a flat bed without using bedrails?: None Help needed moving from lying on your back to sitting on the side of a flat bed without using bedrails?: None Help needed moving to and from a bed to a chair (including a wheelchair)?:  None Help needed standing up from a chair using your arms (e.g., wheelchair or bedside chair)?: None Help needed to walk in hospital room?: A Little Help needed climbing 3-5 steps with a railing? : A Little 6 Click Score: 22    End of Session Equipment Utilized During Treatment: Gait belt Activity Tolerance: Patient tolerated treatment well Patient left: in bed Nurse Communication: Mobility status PT Visit Diagnosis: Muscle weakness (generalized) (M62.81);Difficulty in walking, not elsewhere classified (R26.2)     Time: 1256-1310 PT Time Calculation (min) (ACUTE ONLY): 14 min  Charges:    $Gait Training: 8-22 mins PT General Charges $$ ACUTE PT VISIT: 1 Visit                   Danielle Dess, PTA 04/28/23, 1:16 PM

## 2023-04-28 NOTE — B[facility] Counselor (Signed)
 CSW submitted referral to  [facility].   CSW awaits response.   Reynaldo Minium, [name], [place] 04/28/2023 10:14 AM

## 2023-04-28 NOTE — Progress Notes (Signed)
 [facility] MD Progress Note  04/28/2023 1:20 PM [name]  MRN:  [id number]  [name] is a 58 year old Caucasian male with a history of MDD, anxiety, Crohn's disease, and chronic mesenteric ischemia who presented to [facility] emergency department on 04/13/23 at 0404 for suicide attempt by ingestion of 28 tablets of 10mg  Lexapro at 0200. Per ED documentation, patient had some mild symptoms of serotonin syndrome on arrival, including acute agitation and clonus, with symptomatic improvement with benzodiazepines. Did have a return of symptoms requiring additional dose of lorazepam. Poison control advised the total amount of ingested Lexapro likely is not enough to cause serotonin syndrome by itself and they recommended 8-hour cardiac monitoring. Qtc 498.  Subjective:  Chart reviewed, case discussed in multidisciplinary meeting, patient seen during rounds.  Patient is noted to be resting in bed.  He denies SI/HI/plan.  He denies auditory/visual hallucinations.  He reports feeling depressed and hopeless given his physical condition.  Provider and patient discussed options about optimizing his medications and once he goes home if he continues to feel anhedonia and hopelessness he should consider ECT either at [facility] or [facility].  Patient expressed his understanding.  He is able to participate in some of the group sessions and he gets physical therapy regularly on the unit.  Has fair appetite.  Appetite:  Fair  Past Psychiatric History: see h&P Family History:  Family History  Problem Relation Age of Onset   Heart disease Father    Thyroid cancer Father    Allergies Father    Clotting disorder Father    Breast cancer Mother    Stomach cancer Mother    Colon cancer Neg Hx    Esophageal cancer Neg Hx    Rectal cancer Neg Hx    Social History:  Social History   Substance and Sexual Activity  Alcohol Use No   Alcohol/week: 0.0 standard drinks of alcohol     Social History   Substance and  Sexual Activity  Drug Use Not Currently   Types: Cocaine, Marijuana   Comment: QUIT USING DRUGS IN 1997    Social History   Socioeconomic History   Marital status: Single    Spouse name: Not on file   Number of children: 1   Years of education: Not on file   Highest education level: Not on file  Occupational History   Occupation: Disability   Tobacco Use   Smoking status: Former    Current packs/day: 0.00    Average packs/day: 1 pack/day for 15.0 years (15.0 ttl pk-yrs)    Types: Cigarettes    Start date: 03/28/1982    Quit date: 03/28/1997    Years since quitting: 26.1   Smokeless tobacco: Never  Vaping Use   Vaping status: Never Used  Substance and Sexual Activity   Alcohol use: No    Alcohol/week: 0.0 standard drinks of alcohol   Drug use: Not Currently    Types: Cocaine, Marijuana    Comment: QUIT USING DRUGS IN 1997   Sexual activity: Not Currently  Other Topics Concern   Not on file  Social History Narrative   Single, history of substance abuse in recovery   Previously occupied on medical disability   1 child  Daughter Wellsite geologist, lives and works in [place]   Elderly parents involved and he helps them   1 brother   Social Drivers of Corporate investment banker Strain: Low Risk  (08/28/2019)   Overall Financial Resource Strain (CARDIA)  Difficulty of Paying Living Expenses: Not hard at all  Food Insecurity: Food Insecurity Present (04/13/2023)   Hunger Vital Sign    Worried About Running Out of Food in the Last Year: Never true    Ran Out of Food in the Last Year: Sometimes true  Transportation Needs: No Transportation Needs (04/13/2023)   PRAPARE - Administrator, Civil Service (Medical): No    Lack of Transportation (Non-Medical): No  Physical Activity: Insufficiently Active (11/08/2019)   Exercise Vital Sign    Days of Exercise per Week: 3 days    Minutes of Exercise per Session: 30 min  Stress: Stress Concern Present (08/28/2019)   [employer] of Occupational Health - Occupational Stress Questionnaire    Feeling of Stress : Very much  Social Connections: Socially Isolated (03/28/2023)   Social Connection and Isolation Panel [NHANES]    Frequency of Communication with Friends and Family: Three times a week    Frequency of Social Gatherings with Friends and Family: Twice a week    Attends Religious Services: Never    Database administrator or Organizations: No    Attends Engineer, structural: Never    Marital Status: Divorced   Past Medical History:  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 06/15/2018   Cancer of skin of neck    Cataract    Chronic mesenteric ischemia ([name])    Chronic pain syndrome 07/09/2016   Crohn's disease of small and large intestines ([name])    followed by dr Baldo Ash gessner   Dairy product intolerance    Diarrhea, functional    Family history of adverse reaction to anesthesia    Grover's disease    transient acantholytic dermatosis   History of alcohol abuse    History of basal cell carcinoma excision    2013 left leg   History of Clostridium difficile    10/ 2014   History of multiple concussions    x6   last one Jan 2017 per pt--  no residual   History of substance abuse ([name])    quit 1997 per pt   History of suicide attempt    05-18-2012  overdose /  failure to thrive   Iron deficiency anemia due to chronic blood loss    Major depression, recurrent, chronic ([name])    Osteopenia    Personal history of adenomatous colonic polyps 12/2010, 03/2012   12/2010 - 8 mm serrated adenoma of rectum   Portal vein thrombosis 03/21/2015   right   Primary sclerosing cholangitis    ? hepatitis overlap - liver bx x 2 and MRCP   Seasonal allergies     Steroid-induced diabetes ([name]) 03/15/2022   Substance abuse ([name]) 1997   Alcohol   Vitamin A deficiency 05/23/2018   Vitamin B6 deficiency 07/27/2018   Vitamin C deficiency 05/23/2018    Past Surgical History:  Procedure Laterality Date   ABDOMINAL AORTAGRAM N/A 03/29/2012   Procedure: ABDOMINAL Ronny Flurry;  Surgeon: Nada Libman, MD;  Location: [facility] CATH LAB;  Service: Cardiovascular;  Laterality: N/A;   ADENOIDECTOMY  age 64   BIOPSY  05/31/2018   Procedure: BIOPSY;  Surgeon: Rachael Fee, MD;  Location:  [facility] ENDOSCOPY;  Service: Endoscopy;;   CATARACT EXTRACTION W/ INTRAOCULAR LENS  IMPLANT, BILATERAL  2009   COLONOSCOPY  2001, 05/02/2003, 01/28/11   2012: Right colon Crohn's, rectal polyp   COLONOSCOPY  03/31/2012   Procedure: COLONOSCOPY;  Surgeon: Beverley Fiedler, MD;  Location: [facility] ENDOSCOPY;  Service: Gastroenterology;  Laterality: N/A;   COLONOSCOPY WITH PROPOFOL N/A 05/31/2018   Procedure: COLONOSCOPY WITH PROPOFOL;  Surgeon: Rachael Fee, MD;  Location: [facility] ENDOSCOPY;  Service: Endoscopy;  Laterality: N/A;   ESOPHAGOGASTRODUODENOSCOPY  01/28/2011   Normal   FOOT SURGERY Right age 74   MOHS SURGERY Left 11/2013   left ankle parakerotosis    PERCUTANEOUS LIVER BIOPSY  2007 and 2008   PILONIDAL CYST EXCISION  age 23   PLACEMENT OF SETON N/A 11/06/2015   Procedure: PLACEMENT OF SETON;  Surgeon: Romie Levee, MD;  Location: [facility];  Service: General;  Laterality: N/A;   PLACEMENT OF SETON  07/2018   at [facility]   RECTAL EXAM UNDER ANESTHESIA N/A 01/18/2019   Procedure: ANAL EXAM UNDER ANESTHESIA,  INCISION AND DRAINAGE, SETON PLACEMENT;  Surgeon: Romie Levee, MD;  Location: [facility];  Service: General;  Laterality: N/A;   UPPER GASTROINTESTINAL ENDOSCOPY      Current Medications: Current Facility-Administered Medications  Medication Dose Route Frequency Provider Last Rate Last Admin   acetaminophen (TYLENOL) tablet 650 mg  650 mg  Oral Q6H PRN Motley-Mangrum, Jadeka A, PMHNP       alum & mag hydroxide-simeth (MAALOX/MYLANTA) 200-200-20 MG/5ML suspension 30 mL  30 mL Oral Q4H PRN Motley-Mangrum, Jadeka A, PMHNP       ARIPiprazole (ABILIFY) tablet 5 mg  5 mg Oral QHS Verner Chol, MD   5 mg at 04/27/23 2130   doxycycline (VIBRA-TABS) tablet 100 mg  100 mg Oral Q12H Floydene Flock, MD   100 mg at 04/28/23 0855   feeding supplement (ENSURE ENLIVE / ENSURE PLUS) liquid 237 mL  1 Bottle Oral TID BM Verner Chol, MD   237 mL at 04/28/23 0856   hydrOXYzine (ATARAX) tablet 25 mg  25 mg Oral TID PRN Motley-Mangrum, Ezra Sites, PMHNP   25 mg at 04/28/23 0854   liver oil-zinc oxide (DESITIN) 40 % ointment   Topical Q12H PRN Verner Chol, MD   Given at 04/25/23 1733   LORazepam (ATIVAN) injection 1 mg  1 mg Intramuscular Q4H PRN Remington, Amber E, NP       LORazepam (ATIVAN) injection 2 mg  2 mg Intramuscular TID PRN Motley-Mangrum, Geralynn Ochs A, PMHNP       LORazepam (ATIVAN) tablet 1 mg  1 mg Oral Q4H PRN Remington, Amber E, NP   1 mg at 04/28/23 4098   magnesium hydroxide (MILK OF MAGNESIA) suspension 30 mL  30 mL Oral Daily PRN Motley-Mangrum, Jadeka A, PMHNP       multivitamin with minerals tablet 1 tablet  1 tablet Oral Daily Verner Chol, MD   1 tablet at 04/28/23 0854   ondansetron (ZOFRAN) injection 4 mg  4 mg Intravenous Q6H PRN Verner Chol, MD   4 mg at 04/16/23 0905   ondansetron (ZOFRAN-ODT) disintegrating tablet 4 mg  4 mg Oral Q8H PRN Verner Chol, MD       predniSONE (DELTASONE) tablet 30 mg  30 mg Oral Q breakfast Motley-Mangrum, Jadeka A, PMHNP   30 mg at 04/28/23 0854   venlafaxine XR (EFFEXOR-XR) 24 hr capsule 75 mg  75 mg Oral Q breakfast  Verner Chol, MD   75 mg at 04/28/23 1610    Lab Results:  No results found. However, due to the size of the patient record, not all encounters were searched. Please check Results Review for a complete set of results.    Blood Alcohol level:  Lab Results   Component Value Date   ETH <10 04/13/2023   ETH <10 03/28/2023    Metabolic Disorder Labs: Lab Results  Component Value Date   HGBA1C 4.8 04/03/2023   MPG 91.06 04/03/2023   No results found for: "PROLACTIN" Lab Results  Component Value Date   CHOL 209 (H) 04/03/2023   TRIG 75 04/03/2023   HDL 65 04/03/2023   CHOLHDL 3.2 04/03/2023   VLDL 15 04/03/2023   LDLCALC 129 (H) 04/03/2023    Physical Findings: AIMS:  , ,  ,  ,    CIWA:    COWS:      Psychiatric Specialty Exam:  Presentation  General Appearance:  Appropriate for Environment; Casual  Eye Contact: Fair  Speech: Clear and Coherent  Speech Volume: Normal    Mood and Affect  Mood: Anxious  Affect: Depressed   Thought Process  Thought Processes: Coherent  Descriptions of Associations:Intact  Orientation:Full (Time, Place and Person)  Thought Content:Logical  Hallucinations:Hallucinations: None  Ideas of Reference:None  Suicidal Thoughts:Suicidal Thoughts: No SI Passive Intent and/or Plan: Without Intent; Without Plan  Homicidal Thoughts:Homicidal Thoughts: No   Sensorium  Memory: Immediate Fair; Recent Fair; Remote Fair  Judgment: Impaired  Insight: Shallow   Executive Functions  Concentration: Fair  Attention Span: Fair  Recall: Fair  Fund of Knowledge: Fair  Language: Fair   Psychomotor Activity  Psychomotor Activity: Psychomotor Activity: Normal  Musculoskeletal: Strength & Muscle Tone: decreased Gait & Station: unsteady Assets  Assets: Manufacturing systems engineer; Desire for Improvement; Resilience    Physical Exam: Physical Exam Vitals and nursing note reviewed.  HENT:     Head: Normocephalic.     Nose: Nose normal.     Mouth/Throat:     Mouth: Mucous membranes are moist.  Eyes:     Pupils: Pupils are equal, round, and reactive to light.  Cardiovascular:     Rate and Rhythm: Normal rate.     Pulses: Normal pulses.  Pulmonary:     Breath  sounds: Normal breath sounds.  Abdominal:     General: Bowel sounds are normal.  Neurological:     General: No focal deficit present.     Mental Status: He is alert.    Review of Systems  Constitutional:  Positive for malaise/fatigue.  HENT: Negative.    Eyes: Negative.   Cardiovascular: Negative.   Gastrointestinal: Negative.   Skin: Negative.   Blood pressure 108/84, pulse 76, temperature (!) 97.3 F (36.3 C), resp. rate 17, height 6\' 1"  (1.854 m), weight 60.6 kg, SpO2 100%. Body mass index is 17.61 kg/m.  Diagnosis: Principal Problem:   MDD (major depressive disorder), recurrent severe, without psychosis ([name]) Active Problems:   GAD (generalized anxiety disorder)   Tremor   PLAN: Safety and Monitoring:  -- Voluntary admission to inpatient psychiatric unit for safety, stabilization and treatment  -- Daily contact with patient to assess and evaluate symptoms and progress in treatment  -- Patient's case to be discussed in multi-disciplinary team meeting  -- Observation Level : q15 minute checks  -- Vital signs:  q12 hours  -- Precautions: suicide, elopement, and assault -- Encouraged patient to participate in unit milieu and in scheduled group  therapies  2. Psychiatric Diagnoses and Treatment:  Increased to patient's Effexor from 37.5 mg daily to 75 mg daily  Discussed Abilify 5 mg at bedtime for mood stabilization given the intensity of depression.  Discussed thoroughly the side effects risk benefits of Abilify and patient gave consent for the medication.  Home medications were discontinued during this admission due to inefficacy-Lexapro, Buspar, Remeron                    -- Metabolic profile X3K 4.8; LDL 440; T Cholesterol 209  EKG 04/26/2023 normal sinus rhythm with QTc 423   3. Medical Issues Being Addressed:   serotonin syndrome-resolved Unstable gait-PT OT involved Chronic fatigue-will check TSH and thyroid antibodies to rule out any autoimmune problems  4.  Discharge Planning:   -- Social work and case management to assist with discharge planning and identification of hospital follow-up needs prior to discharge  -- Estimated LOS: 3-4 days  Verner Chol, MD 04/28/2023, 1:20 PM

## 2023-04-28 NOTE — [place] FL2 (Signed)
 [place] MEDICAID FL2 LEVEL OF CARE FORM     IDENTIFICATION  Patient Name: [name] Birthdate: 1965-04-26 Sex: male Admission Date (Current Location): 04/13/2023  [facility] and [place] Number:  Chiropodist and Address:  [facility], [address], [place], [place] [address]      Provider Number: [id number]  Attending Physician Name and Address:  Verner Chol, MD  Relative Name and Phone Number:  Algis Lehenbauer, brother,   [phone]    Current Level of Care: Hospital Recommended Level of Care: Assisted Living Facility Prior Approval Number:    Date Approved/Denied:   PASRR Number:    Discharge Plan: Other (Comment) (ALF)    Current Diagnoses: Patient Active Problem List   Diagnosis Date Noted   Tremor 04/14/2023   MDD (major depressive disorder) 04/13/2023   Crohn's disease ([name]) 03/29/2023   Steroid-induced diabetes ([name]) 03/15/2022   Encounter for long-term use of opiate analgesic 02/08/2021   Iron deficiency anemia 08/11/2020   Current mild episode of major depressive disorder ([name]) 08/28/2019   GAD (generalized anxiety disorder) 08/28/2019   PTSD (post-traumatic stress disorder) 08/14/2019   Vitamin B6 deficiency 07/27/2018   Vitamin E deficiency 07/27/2018   Dilated intrahepatic bile ducts    Vitamin C deficiency 05/23/2018   Vitamin A deficiency 05/23/2018   Grover's disease 11/04/2017   Chronic pain syndrome 07/09/2016   Perirectal fistula and abscess 05/18/2016   Insomnia 11/29/2014   Long-term use of immunosuppressant medication - Entyvio 11/28/2014   Posterior Anal fissure 10/03/2014   Anemia due to chronic blood loss 02/11/2014   Autoimmune hepatitis-PSC overlap 01/19/2013   Rash on trunk 12/31/2012   Arthralgia 08/28/2012   MDD (major depressive disorder), recurrent severe, without psychosis ([name]) 04/12/2012   History of colonic polyps 01/18/2012   Osteopenia 02/17/2011   Long term current use of  systemic steroids 08/11/2010   Essential hypertension 08/09/2008   Vitamin D deficiency 09/26/2007   HYPERLIPIDEMIA, SEVERE 09/26/2007   CROHN'S DISEASE, LARGE AND SMALL INTESTINES 03/28/2007    Orientation RESPIRATION BLADDER Height & Weight     Self, Time, Situation, Place  Normal Continent Weight: 133 lb 8 oz (60.6 kg) Height:  6\' 1"  (185.4 cm)  BEHAVIORAL SYMPTOMS/MOOD NEUROLOGICAL BOWEL NUTRITION STATUS      Continent    AMBULATORY STATUS COMMUNICATION OF NEEDS Skin   Supervision Verbally Normal, Other (Comment) (Dry)                       Personal Care Assistance Level of Assistance  Bathing Bathing Assistance: Limited assistance         Functional Limitations Info             SPECIAL CARE FACTORS FREQUENCY  PT (By licensed PT), OT (By licensed OT)                    Contractures Contractures Info: Not present    Additional Factors Info  Psychotropic, Allergies   Allergies Info: Asacol (mesalamine), Azathioprine, Gluten Meal, Metoprolol Tartrate, Mycophenolate Mofetil, NUTS, Wheat, Amoxicillian, Tape Psychotropic Info: ARIPiprazole (ABILIFY) tablet 5 mg, venlafaxine XR (EFFEXOR-XR) 24 hr capsule 75 mg         Current Medications (04/28/2023):  This is the current hospital active medication list Current Facility-Administered Medications  Medication Dose Route Frequency Provider Last Rate Last Admin   acetaminophen (TYLENOL) tablet 650 mg  650 mg Oral Q6H PRN Motley-Mangrum, Ezra Sites, PMHNP  alum & mag hydroxide-simeth (MAALOX/MYLANTA) 200-200-20 MG/5ML suspension 30 mL  30 mL Oral Q4H PRN Motley-Mangrum, Jadeka A, PMHNP       ARIPiprazole (ABILIFY) tablet 5 mg  5 mg Oral QHS Verner Chol, MD   5 mg at 04/27/23 2130   doxycycline (VIBRA-TABS) tablet 100 mg  100 mg Oral Q12H Floydene Flock, MD   100 mg at 04/28/23 0855   feeding supplement (ENSURE ENLIVE / ENSURE PLUS) liquid 237 mL  1 Bottle Oral TID BM Verner Chol, MD   237 mL at  04/28/23 0856   hydrOXYzine (ATARAX) tablet 25 mg  25 mg Oral TID PRN Motley-Mangrum, Ezra Sites, PMHNP   25 mg at 04/28/23 0854   liver oil-zinc oxide (DESITIN) 40 % ointment   Topical Q12H PRN Verner Chol, MD   Given at 04/25/23 1733   LORazepam (ATIVAN) injection 1 mg  1 mg Intramuscular Q4H PRN Remington, Amber E, NP       LORazepam (ATIVAN) injection 2 mg  2 mg Intramuscular TID PRN Motley-Mangrum, Geralynn Ochs A, PMHNP       LORazepam (ATIVAN) tablet 1 mg  1 mg Oral Q4H PRN Melanie Crazier, Amber E, NP   1 mg at 04/28/23 8657   magnesium hydroxide (MILK OF MAGNESIA) suspension 30 mL  30 mL Oral Daily PRN Motley-Mangrum, Jadeka A, PMHNP       multivitamin with minerals tablet 1 tablet  1 tablet Oral Daily Verner Chol, MD   1 tablet at 04/28/23 0854   ondansetron (ZOFRAN) injection 4 mg  4 mg Intravenous Q6H PRN Verner Chol, MD   4 mg at 04/16/23 0905   ondansetron (ZOFRAN-ODT) disintegrating tablet 4 mg  4 mg Oral Q8H PRN Verner Chol, MD       predniSONE (DELTASONE) tablet 30 mg  30 mg Oral Q breakfast Motley-Mangrum, Jadeka A, PMHNP   30 mg at 04/28/23 0854   venlafaxine XR (EFFEXOR-XR) 24 hr capsule 75 mg  75 mg Oral Q breakfast Verner Chol, MD   75 mg at 04/28/23 8469     Discharge Medications: Please see discharge summary for a list of discharge medications.  Relevant Imaging Results:  Relevant Lab Results:   Additional Information SSN # [id number]  Elza Rafter, [place]

## 2023-04-28 NOTE — Group Note (Signed)
 Recreation Therapy Group Note   Group Topic:Communication  Group Date: 04/28/2023 Start Time: 1400 End Time: 1500 Facilitators: Rosina Lowenstein, LRT, CTRS Location:  Dayroom  Group Description: Emotional Check in. Patient sat and talked with LRT about how they are doing and whatever else is on their mind. LRT provided active listening, reassurance and encouragement. Pts were given the opportunity to listen to music or color mandalas while they talk.    Goal Area(s) Addressed: Patient will engage in conversation with LRT. Patient will communicate their wants, needs, or questions.  Patient will practice a new coping skill of "talking to someone".   Affect/Mood: N/A   Participation Level: Did not attend    Clinical Observations/Individualized Feedback: Patient did not attend group.   Plan: Continue to engage patient in RT group sessions 2-3x/week.   Rosina Lowenstein, LRT, CTRS 04/28/2023 5:03 PM

## 2023-04-28 NOTE — Group Note (Signed)
 Date:  04/28/2023 Time:  8:59 PM  Group Topic/Focus:  Self Care:   The focus of this group is to help patients understand the importance of self-care in order to improve or restore emotional, physical, spiritual, interpersonal, and financial health.    Participation Level:  Active  Participation Quality:  Appropriate  Affect:  Appropriate  Cognitive:  Appropriate  Insight: Appropriate  Engagement in Group:  Engaged  Modes of Intervention:  Discussion  Additional Comments:    Burt Ek 04/28/2023, 8:59 PM

## 2023-04-28 NOTE — Plan of Care (Signed)

## 2023-04-28 NOTE — Group Note (Signed)
 LCSW Group Therapy Note   Group Date: 04/28/2023 Start Time: 1300 End Time: 1345   Type of Therapy and Topic:  Group Therapy: Challenging Core Beliefs  Participation Level:  Minimal  Description of Group:  Patients were educated about core beliefs and asked to identify one harmful core belief that they have. Patients were asked to explore from where those beliefs originate. Patients were asked to discuss how those beliefs make them feel and the resulting behaviors of those beliefs. They were then be asked if those beliefs are true and, if so, what evidence they have to support them. Lastly, group members were challenged to replace those negative core beliefs with helpful beliefs.   Therapeutic Goals:   1. Patient will identify harmful core beliefs and explore the origins of such beliefs. 2. Patient will identify feelings and behaviors that result from those core beliefs. 3. Patient will discuss whether such beliefs are true. 4.  Patient will replace harmful core beliefs with helpful ones.  Summary of Patient Progress:  [name] actively engaged in processing and exploring how core beliefs are formed and how they impact thoughts, feelings, and behaviors. Patient proved open to input from peers and feedback from CSW. Patient demonstrated fair insight into the subject matter, was respectful and supportive of peers, and participated throughout the entire session.  Therapeutic Modalities: Cognitive Behavioral Therapy; Solution-Focused Therapy   Elza Rafter, [place] 04/28/2023  2:48 PM

## 2023-04-28 NOTE — Progress Notes (Signed)
   04/28/23 1700  Psych Admission Type (Psych Patients Only)  Admission Status Involuntary  Psychosocial Assessment  Patient Complaints Depression  Eye Contact Fair  Facial Expression Sad  Affect Sad  Speech Soft  Interaction Minimal  Motor Activity Tremors;Unsteady  Appearance/Hygiene In scrubs  Behavior Characteristics Cooperative  Mood Depressed;Sad  Thought Process  Coherency WDL  Content WDL  Delusions None reported or observed  Perception WDL  Hallucination None reported or observed  Judgment Limited  Confusion WDL  Danger to Self  Current suicidal ideation? Denies  Danger to Others  Danger to Others None reported or observed   Continues on Telesitter monitoring. Worked with therapy for stability today.

## 2023-04-28 NOTE — B[facility] Counselor (Signed)
 CSW contacted [facility] via email to inquire about the referral process.   CSW awaits response  [name], [name], [facility] 04/28/2023 10:14 AM

## 2023-04-29 DIAGNOSIS — F332 Major depressive disorder, recurrent severe without psychotic features: Secondary | ICD-10-CM | POA: Diagnosis not present

## 2023-04-29 NOTE — Progress Notes (Signed)
 Occupational Therapy Group Note   Group Topic:  Neurographic Art Group Date: 04/29/2023 Group Time (start and end): 1300-1400 Facilitators: Wynona Canes, OTR/L   Group Description: Group participated with Neurographic art activity, using watercolor paints to facilitate creative expression and meditation/relaxation for each individual.  Incorporated bimanual coordination, mental focus, emotional processing, task/command following and relaxation techniques as appropriate.  Patients engaged socially with therapist and other group participants throughout session. Allowed to ask questions as appropriate, and encouraged to identify ways they could use/share their creations with themselves and others.     Therapeutic Goal(s): Demonstrate ability to independently manipulate utensils required to participate with and complete activity. Demonstrate ability to cognitively focus on task and follow commands necessary for completion. Demonstrate use of art as an outlet for emotional processing and expression. Identify and demonstrate importance of relaxation, neural calming and meditation for improved participation with life groups.     Individual Participation: Pt quiet but engaged, interacts with minimal prompting. Demonstrated good ability to follow instructions, sustained good focus to task, and endorsed that activity was relaxing. One verbal and visual cue to clarify instruction provided.        Participation Level: Engaged    Participation Quality: Interactive with prompts    Behavior: Appropriate    Speech/Thought Process: Appropriate    Affect/Mood: Appropriate    Modes of Intervention: Verbal instruction, visual instruction, hands on activity, calming music in background    Patient Response to Interventions:  Pt painted >50% of the page. Declined to keep but appreciative of the opportunity to participate.          Plan: Continue to engage patient in PT/OT groups 1-2x/week.   Arman Filter.,  MPH, MS, OTR/L ascom [phone] 04/29/23, 4:56 PM

## 2023-04-29 NOTE — Group Note (Signed)
 Recreation Therapy Group Note   Group Topic:Leisure Education  Group Date: 04/29/2023 Start Time: 1400 End Time: 1450 Facilitators: Rosina Lowenstein, LRT, CTRS Location: Courtyard  Group Description: Music. Patients encouraged to name their favorite song(s) for LRT to play song through speaker for group to hear. Patient educated on the definition of leisure and the importance of having different leisure interests outside of the hospital. Group discussed how leisure activities can often be used as Pharmacologist and that listening to music is one example.   Goal Area(s) Addressed:  Patient will identify a current leisure interest.  Patient will practice making a positive decision. Patient will have the opportunity to try a new leisure activity.  Affect/Mood: N/A   Participation Level: Did not attend    Clinical Observations/Individualized Feedback: Patient did not attend group.   Plan: Continue to engage patient in RT group sessions 2-3x/week.   Rosina Lowenstein, LRT, CTRS 04/29/2023 4:23 PM

## 2023-04-29 NOTE — Plan of Care (Signed)
 Pt remain on on telemonitor observation with x1 person assist for ambulating due to high falls risk. Pt LBM 04/28/23. Denies SI/HI/AVH and states his mood "was a little bit better today." Pt states he has a blister in the upper left area in his sacrum area. The blister is not red and pt denies pain but states he feels it is going to bust. Pt stated he has had two surgeries in the past in the same area that required drainage and would like a Provider to assess the area. Pt states he feels it is getting bigger.   04/28/23 2041  Psych Admission Type (Psych Patients Only)  Admission Status Involuntary  Psychosocial Assessment  Patient Complaints Depression  Eye Contact Fair  Facial Expression Flat  Affect Depressed  Speech Soft  Interaction Minimal  Motor Activity Unsteady  Appearance/Hygiene In scrubs  Behavior Characteristics Cooperative  Mood Depressed  Thought Process  Coherency WDL  Content WDL  Delusions None reported or observed  Perception WDL  Hallucination None reported or observed  Judgment Limited  Confusion WDL  Danger to Self  Current suicidal ideation? Denies  Danger to Others  Danger to Others None reported or observed

## 2023-04-29 NOTE — Progress Notes (Signed)
   04/29/23 1300  Psych Admission Type (Psych Patients Only)  Admission Status Involuntary  Psychosocial Assessment  Patient Complaints Depression  Eye Contact Fair  Facial Expression Sad  Affect Sad  Speech Soft  Interaction Minimal  Motor Activity Tremors;Unsteady  Appearance/Hygiene In scrubs  Behavior Characteristics Cooperative  Mood Depressed  Thought Process  Coherency WDL  Content WDL  Delusions None reported or observed  Perception WDL  Hallucination None reported or observed  Judgment Limited  Confusion WDL  Danger to Self  Current suicidal ideation? Denies  Danger to Others  Danger to Others None reported or observed   Safety observation discontinues. Patient continues to ambulate with walker with staff observation.

## 2023-04-29 NOTE — Progress Notes (Signed)
 [name] MD Progress Note  04/29/2023 4:06 PM [name]  MRN:  [id number]  [name] is a 58 year old Caucasian male with a history of MDD, anxiety, Crohn's disease, and chronic mesenteric ischemia who presented to [name] department on 04/13/23 at 0404 for suicide attempt by ingestion of 28 tablets of 10mg  Lexapro at 0200. Per ED documentation, patient had some mild symptoms of serotonin syndrome on arrival, including acute agitation and clonus, with symptomatic improvement with benzodiazepines. Did have a return of symptoms requiring additional dose of lorazepam. Poison control advised the total amount of ingested Lexapro likely is not enough to cause serotonin syndrome by itself and they recommended 8-hour cardiac monitoring. Qtc 498.  Subjective:  Chart reviewed, case discussed in multidisciplinary meeting, patient seen during rounds.  Patient reports that he had some problems falling asleep last night as he was thinking about his mom.  He reports giving a call to his mom and she was doing well.  He talks about his worry thoughts about thinking about future, he is health, his parents health.  Provider discussed mindfulness tactics that he can practice throughout the day.  He denies SI/HI/plan/intent.  He continues to feel depressed and anxious about his life outside the [name].  He understands he has a lot of medical problems and needs help.  He denies auditory/visual hallucinations.  He is taking his medications with no reported side effects.  Per nursing report patient is working with physical therapy and is taking his medications.  Appetite:  Fair  Past Psychiatric History: see h&P Family History:  Family History  Problem Relation Age of Onset   Heart disease Father    Thyroid cancer Father    Allergies Father    Clotting disorder Father    Breast cancer Mother    Stomach cancer Mother    Colon cancer Neg Hx    Esophageal cancer Neg Hx    Rectal cancer Neg Hx     Social History:  Social History   Substance and Sexual Activity  Alcohol Use No   Alcohol/week: 0.0 standard drinks of alcohol     Social History   Substance and Sexual Activity  Drug Use Not Currently   Types: Cocaine, Marijuana   Comment: QUIT USING DRUGS IN 1997    Social History   Socioeconomic History   Marital status: Single    Spouse name: Not on file   Number of children: 1   Years of education: Not on file   Highest education level: Not on file  Occupational History   Occupation: Disability   Tobacco Use   Smoking status: Former    Current packs/day: 0.00    Average packs/day: 1 pack/day for 15.0 years (15.0 ttl pk-yrs)    Types: Cigarettes    Start date: 03/28/1982    Quit date: 03/28/1997    Years since quitting: 26.1   Smokeless tobacco: Never  Vaping Use   Vaping status: Never Used  Substance and Sexual Activity   Alcohol use: No    Alcohol/week: 0.0 standard drinks of alcohol   Drug use: Not Currently    Types: Cocaine, Marijuana    Comment: QUIT USING DRUGS IN 1997   Sexual activity: Not Currently  Other Topics Concern   Not on file  Social History Narrative   Single, history of substance abuse in recovery   Previously occupied on medical disability   1 child  Daughter Wellsite geologist, lives and works in [place]   Elderly parents  involved and he helps them   1 brother   Social Drivers of Corporate investment banker Strain: Low Risk  (08/28/2019)   Overall Financial Resource Strain (CARDIA)    Difficulty of Paying Living Expenses: Not hard at all  Food Insecurity: Food Insecurity Present (04/13/2023)   Hunger Vital Sign    Worried About Running Out of Food in the Last Year: Never true    Ran Out of Food in the Last Year: Sometimes true  Transportation Needs: No Transportation Needs (04/13/2023)   PRAPARE - Administrator, Civil Service (Medical): No    Lack of Transportation (Non-Medical): No  Physical Activity: Insufficiently Active  (11/08/2019)   Exercise Vital Sign    Days of Exercise per Week: 3 days    Minutes of Exercise per Session: 30 min  Stress: Stress Concern Present (08/28/2019)   [employer] of Occupational Health - Occupational Stress Questionnaire    Feeling of Stress : Very much  Social Connections: Socially Isolated (03/28/2023)   Social Connection and Isolation Panel [NHANES]    Frequency of Communication with Friends and Family: Three times a week    Frequency of Social Gatherings with Friends and Family: Twice a week    Attends Religious Services: Never    Database administrator or Organizations: No    Attends Engineer, structural: Never    Marital Status: Divorced   Past Medical History:  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 06/15/2018   Cancer of skin of neck    Cataract    Chronic mesenteric ischemia ([name])    Chronic pain syndrome 07/09/2016   Crohn's disease of small and large intestines ([name])    followed by dr Baldo Ash gessner   Dairy product intolerance    Diarrhea, functional    Family history of adverse reaction to anesthesia    Grover's disease    transient acantholytic dermatosis   History of alcohol abuse    History of basal cell carcinoma excision    2013 left leg   History of Clostridium difficile    10/ 2014   History of multiple concussions    x6   last one Jan 2017 per pt--  no residual   History of substance abuse ([name])    quit 1997 per pt   History of suicide attempt    05-18-2012  overdose /  failure to thrive   Iron deficiency anemia due to chronic blood loss    Major depression, recurrent, chronic ([name])    Osteopenia    Personal history of adenomatous colonic polyps 12/2010, 03/2012   12/2010 - 8 mm serrated adenoma  of rectum   Portal vein thrombosis 03/21/2015   right   Primary sclerosing cholangitis    ? 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   Substance abuse ([name]) 1997   Alcohol   Vitamin A deficiency 05/23/2018   Vitamin B6 deficiency 07/27/2018   Vitamin C deficiency 05/23/2018    Past Surgical History:  Procedure Laterality Date   ABDOMINAL AORTAGRAM N/A 03/29/2012   Procedure: ABDOMINAL Ronny Flurry;  Surgeon: Nada Libman, MD;  Location: [facility]ATH [facility];  Service: Cardiovascular;  Laterality: N/A;   ADENOIDECTOMY  age 43   BIOPSY  05/31/2018   Procedure: BIOPSY;  Surgeon: Rachael Fee, MD;  Location: [facility] ENDOSCOPY;  Service: Endoscopy;;   CATARACT EXTRACTION W/ INTRAOCULAR LENS  IMPLANT, BILATERAL  2009   COLONOSCOPY  2001, 05/02/2003, 01/28/11   2012: Right colon Crohn's, rectal polyp   COLONOSCOPY  03/31/2012   Procedure: COLONOSCOPY;  Surgeon: Beverley Fiedler, MD;  Location: [name];  Service: Gastroenterology;  Laterality: N/A;   COLONOSCOPY WITH PROPOFOL N/A 05/31/2018   Procedure: COLONOSCOPY WITH PROPOFOL;  Surgeon: Rachael Fee, MD;  Location: [facility] ENDOSCOPY;  Service: Endoscopy;  Laterality: N/A;   ESOPHAGOGASTRODUODENOSCOPY  01/28/2011   Normal   FOOT SURGERY Right age 60   MOHS SURGERY Left 11/2013   left ankle parakerotosis    PERCUTANEOUS LIVER BIOPSY  2007 and 2008   PILONIDAL CYST EXCISION  age 26   PLACEMENT OF SETON N/A 11/06/2015   Procedure: PLACEMENT OF SETON;  Surgeon: Romie Levee, MD;  Location: [facility];  Service: General;  Laterality: N/A;   PLACEMENT OF SETON  07/2018   at [facility]   RECTAL EXAM UNDER ANESTHESIA N/A 01/18/2019   Procedure: ANAL EXAM UNDER ANESTHESIA,  INCISION AND DRAINAGE, SETON PLACEMENT;  Surgeon: Romie Levee, MD;  Location: [name];  Service: General;  Laterality: N/A;   UPPER GASTROINTESTINAL ENDOSCOPY      Current  Medications: Current Facility-Administered Medications  Medication Dose Route Frequency Provider Last Rate Last Admin   acetaminophen (TYLENOL) tablet 650 mg  650 mg Oral Q6H PRN Motley-Mangrum, Jadeka A, PMHNP       alum & mag hydroxide-simeth (MAALOX/MYLANTA) 200-200-20 MG/5ML suspension 30 mL  30 mL Oral Q4H PRN Motley-Mangrum, Jadeka A, PMHNP       ARIPiprazole (ABILIFY) tablet 5 mg  5 mg Oral QHS Verner Chol, MD   5 mg at 04/28/23 2114   doxycycline (VIBRA-TABS) tablet 100 mg  100 mg Oral Q12H Floydene Flock, MD   100 mg at 04/29/23 0919   feeding supplement (ENSURE ENLIVE / ENSURE PLUS) liquid 237 mL  1 Bottle Oral TID BM Verner Chol, MD   237 mL at 04/29/23 1410   hydrOXYzine (ATARAX) tablet 25 mg  25 mg Oral TID PRN Motley-Mangrum, Ezra Sites, PMHNP   25 mg at 04/28/23 1957   liver oil-zinc oxide (DESITIN) 40 % ointment   Topical Q12H PRN Verner Chol, MD   Given at 04/25/23 1733   LORazepam (ATIVAN) injection 1 mg  1 mg Intramuscular Q4H PRN Remington, Amber E, NP       LORazepam (ATIVAN) injection 2 mg  2 mg Intramuscular TID PRN Motley-Mangrum, Geralynn Ochs A, PMHNP       LORazepam (ATIVAN) tablet 1 mg  1 mg Oral Q4H PRN Remington, Amber E, NP   1 mg at 04/29/23 0404   magnesium hydroxide (MILK OF MAGNESIA) suspension 30 mL  30 mL Oral Daily PRN Motley-Mangrum, Jadeka A, PMHNP       multivitamin with minerals tablet 1 tablet  1 tablet Oral Daily Verner Chol, MD   1 tablet at 04/29/23 0918   ondansetron (ZOFRAN) injection 4 mg  4 mg Intravenous Q6H PRN Verner Chol, MD   4 mg at 04/16/23 0905   ondansetron (ZOFRAN-ODT) disintegrating tablet 4 mg  4 mg Oral Q8H PRN Verner Chol, MD       predniSONE (DELTASONE) tablet 30 mg  30 mg Oral Q breakfast Motley-Mangrum, Jadeka A, PMHNP   30 mg at 04/29/23 0919   venlafaxine XR (EFFEXOR-XR) 24 hr capsule 75 mg  75 mg Oral Q breakfast Verner Chol, MD   75 mg at 04/29/23 0981    Lab Results:  No results found. However, due to  the size of the patient record, not all encounters were searched. Please check Results Review for a complete set of results.    Blood Alcohol level:  Lab Results  Component Value Date   ETH <10 04/13/2023   ETH <10 03/28/2023    Metabolic Disorder Labs: Lab Results  Component Value Date   HGBA1C 4.8 04/03/2023   MPG 91.06 04/03/2023   No results found for: "PROLACTIN" Lab Results  Component Value Date   CHOL 209 (H) 04/03/2023   TRIG 75 04/03/2023   HDL 65 04/03/2023   CHOLHDL 3.2 04/03/2023   VLDL 15 04/03/2023   LDLCALC 129 (H) 04/03/2023    Physical Findings: AIMS:  , ,  ,  ,    CIWA:    COWS:      Psychiatric Specialty Exam:  Presentation  General Appearance:  Appropriate for Environment; Casual  Eye Contact: Fair  Speech: Clear and Coherent  Speech Volume: Normal    Mood and Affect  Mood: Dysphoric; Hopeless  Affect: Depressed; Flat   Thought Process  Thought Processes: Coherent  Descriptions of Associations:Intact  Orientation:Full (Time, Place and Person)  Thought Content:Logical  Hallucinations:Hallucinations: None  Ideas of Reference:None  Suicidal Thoughts:Suicidal Thoughts: No  Homicidal Thoughts:Homicidal Thoughts: No   Sensorium  Memory: Immediate Fair; Recent Fair; Remote Fair  Judgment: Impaired  Insight: Shallow   Executive Functions  Concentration: Fair  Attention Span: Fair  Recall: Fair  Fund of Knowledge: Fair  Language: Fair   Psychomotor Activity  Psychomotor Activity: Psychomotor Activity: Normal  Musculoskeletal: Strength & Muscle Tone: decreased Gait & Station: unsteady Assets  Assets: Manufacturing systems engineer; Desire for Improvement; Resilience    Physical Exam: Physical Exam Vitals and nursing note reviewed.  HENT:     Head: Normocephalic.     Nose: Nose normal.     Mouth/Throat:     Mouth: Mucous membranes are moist.  Eyes:     Pupils: Pupils are equal, round, and  reactive to light.  Cardiovascular:     Rate and Rhythm: Normal rate.     Pulses: Normal pulses.  Pulmonary:     Breath sounds: Normal breath sounds.  Abdominal:     General: Bowel sounds are normal.  Neurological:     General: No focal deficit present.     Mental Status: He is alert.    Review of Systems  Constitutional:  Positive for malaise/fatigue.  HENT: Negative.    Eyes: Negative.   Cardiovascular: Negative.   Gastrointestinal: Negative.   Skin: Negative.   Blood pressure 101/83, pulse 70, temperature 98.4 F (36.9 C), resp. rate 18, height 6\' 1"  (1.854 m), weight 60.6 kg, SpO2 100%. Body mass index is 17.61 kg/m.  Diagnosis: Principal Problem:   MDD (major depressive disorder), recurrent severe, without psychosis ([name]) Active Problems:   GAD (generalized anxiety disorder)   Tremor  Clinical decision making-patient is currently admitted after a lethal suicide attempt by overdosing on her antidepressants.  Patient is noted to have disabling physical problems including Crohn's disease, chronic pain, physical weakness.  Patient continues to struggle with hopelessness, anhedonia, severe anxiety and panic attacks.  His suicidal ideation have improved to intermittent occurrence  and denies any hallucinations.  We continue to adjust the medications to help with his treatment resistant depression.  He continues to need inpatient psychiatric hospitalization. PLAN: Safety and Monitoring:  -- Voluntary admission to inpatient psychiatric unit for safety, stabilization and treatment  -- Daily contact with patient to assess and evaluate symptoms and progress in treatment  -- Patient's case to be discussed in multi-disciplinary team meeting  -- Observation Level : q15 minute checks  -- Vital signs:  q12 hours  -- Precautions: suicide, elopement, and assault -- Encouraged patient to participate in unit milieu and in scheduled group therapies  2. Psychiatric Diagnoses and Treatment:   Effexor Xr 75 mg daily  Discussed Abilify 5 mg at bedtime for mood stabilization given the intensity of depression.  Discussed thoroughly the side effects risk benefits of Abilify and patient gave consent for the medication.  Home medications were discontinued during this admission due to inefficacy-Lexapro, Buspar, Remeron                    -- Metabolic profile M5H 4.8; LDL 846; T Cholesterol 209  EKG 04/26/2023 normal sinus rhythm with QTc 423   3. Medical Issues Being Addressed:   serotonin syndrome-resolved Unstable gait-PT OT involved Chronic fatigue-will check TSH and thyroid antibodies to rule out any autoimmune problems  4. Discharge Planning:   -- Social work and case management to assist with discharge planning and identification of [name] follow-up needs prior to discharge  -- Estimated LOS: 3-4 days  Verner Chol, MD 04/29/2023, 4:06 PM

## 2023-04-29 NOTE — Progress Notes (Signed)
 Occupational Therapy Treatment Patient Details Name: [name] MRN: [id number] DOB: 11-Mar-1965 Today's Date: 04/29/2023   History of present illness Tres Grzywacz is a 58 year old Caucasian male with a history of MDD, anxiety, Crohn's disease, and chronic mesenteric ischemia who presented to ED on 04/13/23 at 0404 for suicide attempt by ingestion of 28 tablets of 10mg  Lexapro at 0200. Patient sent to behavior Med unit.   OT comments  Pt seen for OT tx. Pt endorses feeling fatigued but agreeable and eager to participate in shaving his face. Pt required <MIN A for thoroughness, demonstrating improvement. Tolerated well. With cues, washed face and applied lotion to address significant facial dryness. OT facilitated problem solving to identify time/s of day that are more difficult for pt and strategies to help him through. Pt endorsed that the time between lunch and dinner was the "hardest" for him. With prompting, pt was able to identify engagement in activities like grooming, painting, and taking short walks as helpful to distract him from his thoughts/worries. Pt endorses he is worried about where he will go and what the plan is, stating "I'm afraid they are just going to kick me out." Pt continues to benefit from skilled OT services. Demonstrating good progress.      If plan is discharge home, recommend the following:  A little help with bathing/dressing/bathroom;Assistance with cooking/housework;Assist for transportation;Direct supervision/assist for medications management;Direct supervision/assist for financial management;Supervision due to cognitive status;Help with stairs or ramp for entrance   Equipment Recommendations  Other (comment) (2ww)    Recommendations for Other Services      Precautions / Restrictions Precautions Precautions: Fall Restrictions Weight Bearing Restrictions Per Provider Order: No       Mobility Bed Mobility Overal bed mobility: Modified  Independent                  Transfers Overall transfer level: Modified independent Equipment used: Rolling walker (2 wheels) Transfers: Sit to/from Stand Sit to Stand: Modified independent (Device/Increase time)                 Balance Overall balance assessment: Mild deficits observed, not formally tested Sitting-balance support: Feet supported Sitting balance-Leahy Scale: Good                                     ADL either performed or assessed with clinical judgement   ADL Overall ADL's : Needs assistance/impaired     Grooming: Sitting;Supervision/safety;Set up;Minimal assistance Grooming Details (indicate cue type and reason): Pt sat in recliner in front of sink/mirror to shave his face using electric razor requiring supervision and MIN A for thoroughness.                                    Extremity/Trunk Assessment              Vision       Restaurant manager, fast food Communication: No apparent difficulties   Cognition Arousal: Alert Behavior During Therapy: Flat affect Cognition: No family/caregiver present to determine baseline             OT - Cognition Comments: Pt endorsing feeling anxious and worried that "people are going to kick me out" and "I don't know where I'm going or what I'll do"  Following commands: Intact        Cueing   Cueing Techniques: Verbal cues  Exercises Other Exercises Other Exercises: OT facilitated problem solving to identify time/s of day that are more difficult for pt and strategies to help him through.    Shoulder Instructions       General Comments      Pertinent Vitals/ Pain       Pain Assessment Pain Assessment: No/denies pain  Home Living                                          Prior Functioning/Environment              Frequency  Min 1X/week        Progress Toward Goals  OT  Goals(current goals can now be found in the care plan section)  Progress towards OT goals: Progressing toward goals  Acute Rehab OT Goals Patient Stated Goal: work on getting better OT Goal Formulation: With patient Time For Goal Achievement: 05/02/23 Potential to Achieve Goals: Good  Plan      Co-evaluation                 AM-PAC OT "6 Clicks" Daily Activity     Outcome Measure   Help from another person eating meals?: None Help from another person taking care of personal grooming?: A Little Help from another person toileting, which includes using toliet, bedpan, or urinal?: A Little Help from another person bathing (including washing, rinsing, drying)?: A Little Help from another person to put on and taking off regular upper body clothing?: None Help from another person to put on and taking off regular lower body clothing?: A Little 6 Click Score: 20    End of Session Equipment Utilized During Treatment: Rolling walker (2 wheels)  OT Visit Diagnosis: Other abnormalities of gait and mobility (R26.89);Muscle weakness (generalized) (M62.81)   Activity Tolerance Patient tolerated treatment well   Patient Left in bed;with call bell/phone within reach   Nurse Communication          Time: 0981-1914 OT Time Calculation (min): 19 min  Charges: OT General Charges $OT Visit: 1 Visit OT Treatments $Self Care/Home Management : 8-22 mins  Arman Filter., MPH, MS, OTR/L ascom [phone] 04/29/23, 4:53 PM

## 2023-04-30 ENCOUNTER — Encounter: Payer: Self-pay | Admitting: Psychiatry

## 2023-04-30 DIAGNOSIS — F332 Major depressive disorder, recurrent severe without psychotic features: Secondary | ICD-10-CM | POA: Diagnosis not present

## 2023-04-30 NOTE — Plan of Care (Signed)
   Problem: Education: Goal: Knowledge of [facility] Education information/materials will improve Outcome: Progressing Goal: Emotional status will improve Outcome: Progressing Goal: Mental status will improve Outcome: Progressing Goal: Verbalization of understanding the information provided will improve Outcome: Progressing

## 2023-04-30 NOTE — Plan of Care (Signed)
 D: Pt alert and oriented. Pt reports experiencing anxiety/depression at this time. Pt denies experiencing any pain at this time. Pt denies experiencing any SI/HI, or AVH at this time.   A: Scheduled medications administered to pt, per MD orders. Support and encouragement provided. Frequent verbal contact made. Routine safety checks conducted q15 minutes.   R: No adverse drug reactions noted. Pt verbally contracts for safety at this time. Pt compliant with medications and treatment plan. Pt interacts well but minimally with others on the unit. Pt remains safe at this time. Plan of care ongoing.  Pt observed walking in hallway and passed the nurse's station without prompting. Pt has performed this twice. Pt also walks to and from meals with use of walker.  Problem: Education: Goal: Emotional status will improve Outcome: Not Progressing   Problem: Activity: Goal: Interest or engagement in activities will improve Outcome: Progressing

## 2023-04-30 NOTE — Progress Notes (Signed)
   04/29/23 2300  Psych Admission Type (Psych Patients Only)  Admission Status Involuntary  Psychosocial Assessment  Patient Complaints Depression  Eye Contact Fair  Facial Expression Sad  Affect Flat;Sad  Speech Soft  Interaction Minimal  Motor Activity Tremors;Unsteady  Appearance/Hygiene In scrubs  Behavior Characteristics Cooperative  Mood Depressed  Thought Process  Coherency WDL  Content WDL  Delusions None reported or observed  Perception WDL  Hallucination None reported or observed  Judgment Limited  Confusion WDL  Danger to Self  Current suicidal ideation? Denies  Danger to Others  Danger to Others None reported or observed

## 2023-04-30 NOTE — Progress Notes (Signed)
 Patient ambulating with front wheel walker  denies SI/HI/A/VH at present and verbally contracted for safety. Compliant with medications no adverse effects noted. Q  minutes safety checks ongoing, Patient remains safe. Support and encouragement provided as needed.

## 2023-04-30 NOTE — BH IP Treatment Plan (Signed)
 Interdisciplinary Treatment and Diagnostic Plan Update  04/30/2023 Time of Session: 0948am [name] MRN: [id number]  Principal Diagnosis: MDD (major depressive disorder), recurrent severe, without psychosis ([name])  Secondary Diagnoses: Principal Problem:   MDD (major depressive disorder), recurrent severe, without psychosis ([name]) Active Problems:   GAD (generalized anxiety disorder)   Tremor   Current Medications:  Current Facility-Administered Medications  Medication Dose Route Frequency Provider Last Rate Last Admin   acetaminophen (TYLENOL) tablet 650 mg  650 mg Oral Q6H PRN Motley-Mangrum, Jadeka A, PMHNP       alum & mag hydroxide-simeth (MAALOX/MYLANTA) 200-200-20 MG/5ML suspension 30 mL  30 mL Oral Q4H PRN Motley-Mangrum, Jadeka A, PMHNP       ARIPiprazole (ABILIFY) tablet 5 mg  5 mg Oral QHS Verner Chol, MD   5 mg at 04/29/23 2123   doxycycline (VIBRA-TABS) tablet 100 mg  100 mg Oral Q12H Floydene Flock, MD   100 mg at 04/30/23 0827   feeding supplement (ENSURE ENLIVE / ENSURE PLUS) liquid 237 mL  1 Bottle Oral TID BM Verner Chol, MD   237 mL at 04/29/23 2125   hydrOXYzine (ATARAX) tablet 25 mg  25 mg Oral TID PRN Motley-Mangrum, Ezra Sites, PMHNP   25 mg at 04/29/23 2123   liver oil-zinc oxide (DESITIN) 40 % ointment   Topical Q12H PRN Verner Chol, MD   Given at 04/25/23 1733   LORazepam (ATIVAN) injection 1 mg  1 mg Intramuscular Q4H PRN Remington, Amber E, NP       LORazepam (ATIVAN) injection 2 mg  2 mg Intramuscular TID PRN Motley-Mangrum, Geralynn Ochs A, PMHNP       LORazepam (ATIVAN) tablet 1 mg  1 mg Oral Q4H PRN Melanie Crazier, Amber E, NP   1 mg at 04/30/23 0827   magnesium hydroxide (MILK OF MAGNESIA) suspension 30 mL  30 mL Oral Daily PRN Motley-Mangrum, Jadeka A, PMHNP       multivitamin with minerals tablet 1 tablet  1 tablet Oral Daily Verner Chol, MD   1 tablet at 04/30/23 0827   ondansetron (ZOFRAN) injection 4 mg  4 mg Intravenous Q6H PRN  Verner Chol, MD   4 mg at 04/16/23 0905   ondansetron (ZOFRAN-ODT) disintegrating tablet 4 mg  4 mg Oral Q8H PRN Verner Chol, MD       predniSONE (DELTASONE) tablet 30 mg  30 mg Oral Q breakfast Motley-Mangrum, Jadeka A, PMHNP   30 mg at 04/30/23 4742   venlafaxine XR (EFFEXOR-XR) 24 hr capsule 75 mg  75 mg Oral Q breakfast Verner Chol, MD   75 mg at 04/30/23 0827   PTA Medications: Medications Prior to Admission  Medication Sig Dispense Refill Last Dose/Taking   busPIRone (BUSPAR) 10 MG tablet Take 1 tablet (10 mg total) by mouth 2 (two) times daily. 30 tablet 0 04/12/2023   dicyclomine (BENTYL) 10 MG capsule Take 1 capsule (10 mg total) by mouth 3 (three) times daily before meals. 90 capsule 0 Taking   escitalopram (LEXAPRO) 10 MG tablet Take 1 tablet (10 mg total) by mouth at bedtime. 30 tablet 0 04/13/2023   ketoconazole (NIZORAL) 2 % cream Apply 1 Application topically daily.   Taking   LORazepam (ATIVAN) 0.5 MG tablet Take 0.5 mg by mouth daily.   Taking   mirtazapine (REMERON) 30 MG tablet Take 1 tablet (30 mg total) by mouth at bedtime. 30 tablet 0 04/11/2023   predniSONE (DELTASONE) 20 MG tablet Take 1.5 tablets (30 mg total) by mouth  daily with breakfast. 100 tablet 2 Past Month    Patient Stressors:    Patient Strengths:    Treatment Modalities: Medication Management, Group therapy, Case management,  1 to 1 session with clinician, Psychoeducation, Recreational therapy.   Physician Treatment Plan for Primary Diagnosis: MDD (major depressive disorder), recurrent severe, without psychosis ([name]) Long Term Goal(s):     Short Term Goals:    Medication Management: Evaluate patient's response, side effects, and tolerance of medication regimen.  Therapeutic Interventions: 1 to 1 sessions, Unit Group sessions and Medication administration.  Evaluation of Outcomes: Progressing  Physician Treatment Plan for Secondary Diagnosis: Principal Problem:   MDD (major depressive  disorder), recurrent severe, without psychosis ([name]) Active Problems:   GAD (generalized anxiety disorder)   Tremor  Long Term Goal(s):     Short Term Goals:       Medication Management: Evaluate patient's response, side effects, and tolerance of medication regimen.  Therapeutic Interventions: 1 to 1 sessions, Unit Group sessions and Medication administration.  Evaluation of Outcomes: Progressing   RN Treatment Plan for Primary Diagnosis: MDD (major depressive disorder), recurrent severe, without psychosis ([name]) Long Term Goal(s): Knowledge of disease and therapeutic regimen to maintain health will improve  Short Term Goals: Ability to remain free from injury will improve, Ability to verbalize frustration and anger appropriately will improve, Ability to demonstrate self-control, Ability to participate in decision making will improve, Ability to verbalize feelings will improve, Ability to disclose and discuss suicidal ideas, Ability to identify and develop effective coping behaviors will improve, and Compliance with prescribed medications will improve  Medication Management: RN will administer medications as ordered by provider, will assess and evaluate patient's response and provide education to patient for prescribed medication. RN will report any adverse and/or side effects to prescribing provider.  Therapeutic Interventions: 1 on 1 counseling sessions, Psychoeducation, Medication administration, Evaluate responses to treatment, Monitor vital signs and CBGs as ordered, Perform/monitor CIWA, COWS, AIMS and Fall Risk screenings as ordered, Perform wound care treatments as ordered.  Evaluation of Outcomes: Progressing   LCSW Treatment Plan for Primary Diagnosis: MDD (major depressive disorder), recurrent severe, without psychosis ([name]) Long Term Goal(s): Safe transition to appropriate next level of care at discharge, Engage patient in therapeutic group addressing interpersonal  concerns.  Short Term Goals: Engage patient in aftercare planning with referrals and resources, Increase social support, Increase ability to appropriately verbalize feelings, Increase emotional regulation, Facilitate acceptance of mental health diagnosis and concerns, Facilitate patient progression through stages of change regarding substance use diagnoses and concerns, Identify triggers associated with mental health/substance abuse issues, and Increase skills for wellness and recovery  Therapeutic Interventions: Assess for all discharge needs, 1 to 1 time with Social worker, Explore available resources and support systems, Assess for adequacy in community support network, Educate family and significant other(s) on suicide prevention, Complete Psychosocial Assessment, Interpersonal group therapy.  Evaluation of Outcomes: Progressing   Progress in Treatment: Attending groups: No. Participating in groups: No. Taking medication as prescribed: Yes. Toleration medication: Yes. Family/Significant other contact made: Yes, individual(s) contacted:  Feliberto Harts, brother  Patient understands diagnosis: Yes. Discussing patient identified problems/goals with staff: Yes. Medical problems stabilized or resolved: Yes. Denies suicidal/homicidal ideation: Yes. Issues/concerns per patient self-inventory: No. Other: none identified  New problem(s) identified: No, Describe:   No, Describe:  None identified  Update 04/20/23: No changes at this time Update 04/25/23: No changes at this time 04/30/23 no changes at this time  New Short Term/Long Term Goal(s): elimination  of symptoms of psychosis, medication management for mood stabilization; elimination of SI thoughts; development of comprehensive mental wellness plan. Update 04/20/23: No changes at this time Update 04/25/23: No changes at this time 3/1/205 none at this time    Patient Goals:  " I want to get control of my anxiety" Update 04/20/23: No changes at this  time Update 04/25/23: No changes at this time 04/30/23 no changes  Discharge Plan or Barriers: CSW to assist with appropriate discharge planning Update 04/20/23: No changes at this time Update 04/25/23: Pt's brother is assisting him with finding independent living facility. CSW will check with  pt's brother on progress at this time. Provider has suggested ECT as a possibility depression does not seem to be getting better   Reason for Continuation of Hospitalization: Anxiety medication stabilization, depression  Estimated Length of Stay: 1-7 days  Last 3 [place] Suicide Severity Risk Score: Flowsheet Row Admission (Current) from 04/13/2023 in [facility] BEHAVIORAL MEDICINE Most recent reading at 04/13/2023  7:45 PM ED from 04/13/2023 in [facility] Emergency Department at [facility] Most recent reading at 04/13/2023  4:16 AM ED from 04/11/2023 in [facility] Most recent reading at 04/11/2023  1:11 PM  C-SSRS RISK CATEGORY High Risk High Risk No Risk       Last PHQ 2/9 Scores:    06/06/2020   11:06 AM 03/06/2020    2:49 PM 08/28/2019    1:04 PM  Depression screen PHQ 2/9  Decreased Interest 0 0 0  Down, Depressed, Hopeless 0 1 1  PHQ - 2 Score 0 1 1  Altered sleeping 1 2 3   Tired, decreased energy 1 2 3   Change in appetite 1 1 1   Feeling bad or failure about yourself  0 0 0  Trouble concentrating 1 2 3   Moving slowly or fidgety/restless 1 0 0  Suicidal thoughts 0 0 0  PHQ-9 Score 5 8 11   Difficult doing work/chores   Somewhat difficult    Scribe for Treatment Team: Charise Killian 04/30/2023 9:48 AM

## 2023-04-30 NOTE — Plan of Care (Signed)
  Problem: Education: Goal: Knowledge of [facility] Education information/materials will improve 04/30/2023 0413 by Margarita Rana, RN Outcome: Progressing 04/30/2023 0038 by Margarita Rana, RN Outcome: Progressing Goal: Emotional status will improve 04/30/2023 0413 by Margarita Rana, RN Outcome: Progressing 04/30/2023 0038 by Margarita Rana, RN Outcome: Progressing Goal: Mental status will improve 04/30/2023 0413 by Margarita Rana, RN Outcome: Progressing 04/30/2023 0038 by Margarita Rana, RN Outcome: Progressing Goal: Verbalization of understanding the information provided will improve 04/30/2023 0413 by Margarita Rana, RN Outcome: Progressing 04/30/2023 0038 by Margarita Rana, RN Outcome: Progressing

## 2023-04-30 NOTE — Progress Notes (Signed)
 [facility] MD Progress Note  04/30/2023 7:06 PM [name]  MRN:  [id number]  [name] is a 58 year old Caucasian male with a history of MDD, anxiety, Crohn's disease, and chronic mesenteric ischemia who presented to [facility] emergency department on 04/13/23 at 0404 for suicide attempt by ingestion of 28 tablets of 10mg  Lexapro at 0200. Per ED documentation, patient had some mild symptoms of serotonin syndrome on arrival, including acute agitation and clonus, with symptomatic improvement with benzodiazepines. Did have a return of symptoms requiring additional dose of lorazepam. Poison control advised the total amount of ingested Lexapro likely is not enough to cause serotonin syndrome by itself and they recommended 8-hour cardiac monitoring. Qtc 498.  Subjective:  Chart reviewed, case discussed in multidisciplinary meeting, patient seen during rounds.  Staff reports patient is doing better on the unit.  Patient today endorses an "little better" mood.  He denies suicide thoughts, which is an improvement from past week.  Patient reports his sleep and appetite has improved.  He feels hopeful.   He denies SI/HI/plan/intent.  He understands he has a lot of medical problems and needs help.  He denies auditory/visual hallucinations.  He is taking his medications with no reported side effects.  Appetite:  Fair  Past Psychiatric History: see h&P Family History:  Family History  Problem Relation Age of Onset   Heart disease Father    Thyroid cancer Father    Allergies Father    Clotting disorder Father    Breast cancer Mother    Stomach cancer Mother    Colon cancer Neg Hx    Esophageal cancer Neg Hx    Rectal cancer Neg Hx    Social History:  Social History   Substance and Sexual Activity  Alcohol Use No   Alcohol/week: 0.0 standard drinks of alcohol     Social History   Substance and Sexual Activity  Drug Use Not Currently   Types: Cocaine, Marijuana   Comment: QUIT USING  DRUGS IN 1997    Social History   Socioeconomic History   Marital status: Single    Spouse name: Not on file   Number of children: 1   Years of education: Not on file   Highest education level: Not on file  Occupational History   Occupation: Disability   Tobacco Use   Smoking status: Former    Current packs/day: 0.00    Average packs/day: 1 pack/day for 15.0 years (15.0 ttl pk-yrs)    Types: Cigarettes    Start date: 03/28/1982    Quit date: 03/28/1997    Years since quitting: 26.1   Smokeless tobacco: Never  Vaping Use   Vaping status: Never Used  Substance and Sexual Activity   Alcohol use: No    Alcohol/week: 0.0 standard drinks of alcohol   Drug use: Not Currently    Types: Cocaine, Marijuana    Comment: QUIT USING DRUGS IN 1997   Sexual activity: Not Currently  Other Topics Concern   Not on file  Social History Narrative   Single, history of substance abuse in recovery   Previously occupied on medical disability   1 child  Daughter Wellsite geologist, lives and works in [place]   Elderly parents involved and he helps them   1 brother   Social Drivers of Corporate investment banker Strain: Low Risk  (08/28/2019)   Overall Financial Resource Strain (CARDIA)    Difficulty of Paying Living Expenses: Not hard at all  Food Insecurity: Food  Insecurity Present (04/13/2023)   Hunger Vital Sign    Worried About Running Out of Food in the Last Year: Never true    Ran Out of Food in the Last Year: Sometimes true  Transportation Needs: No Transportation Needs (04/13/2023)   PRAPARE - Administrator, Civil Service (Medical): No    Lack of Transportation (Non-Medical): No  Physical Activity: Insufficiently Active (11/08/2019)   Exercise Vital Sign    Days of Exercise per Week: 3 days    Minutes of Exercise per Session: 30 min  Stress: Stress Concern Present (08/28/2019)   [employer] of Occupational Health - Occupational Stress Questionnaire    Feeling of Stress :  Very much  Social Connections: Socially Isolated (03/28/2023)   Social Connection and Isolation Panel [NHANES]    Frequency of Communication with Friends and Family: Three times a week    Frequency of Social Gatherings with Friends and Family: Twice a week    Attends Religious Services: Never    Database administrator or Organizations: No    Attends Engineer, structural: Never    Marital Status: Divorced   Past Medical History:  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 06/15/2018   Cancer of skin of neck    Cataract    Chronic mesenteric ischemia ([name])    Chronic pain syndrome 07/09/2016   Crohn's disease of small and large intestines ([name])    followed by dr Baldo Ash gessner   Dairy product intolerance    Diarrhea, functional    Family history of adverse reaction to anesthesia    Grover's disease    transient acantholytic dermatosis   History of alcohol abuse    History of basal cell carcinoma excision    2013 left leg   History of Clostridium difficile    10/ 2014   History of multiple concussions    x6   last one Jan 2017 per pt--  no residual   History of substance abuse ([name])    quit 1997 per pt   History of suicide attempt    05-18-2012  overdose /  failure to thrive   Iron deficiency anemia due to chronic blood loss    Major depression, recurrent, chronic ([name])    Osteopenia    Personal history of adenomatous colonic polyps 12/2010, 03/2012   12/2010 - 8 mm serrated adenoma of rectum   Portal vein thrombosis 03/21/2015   right   Primary sclerosing cholangitis    ? 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   Substance abuse ([name]) 1997   Alcohol   Vitamin A  deficiency 05/23/2018   Vitamin B6 deficiency 07/27/2018   Vitamin C deficiency 05/23/2018    Past Surgical History:  Procedure Laterality Date   ABDOMINAL AORTAGRAM N/A 03/29/2012   Procedure: ABDOMINAL Ronny Flurry;  Surgeon: Nada Libman, MD;  Location: [facility] CATH LAB;  Service: Cardiovascular;  Laterality: N/A;   ADENOIDECTOMY  age 73   BIOPSY  05/31/2018   Procedure: BIOPSY;  Surgeon: Rachael Fee, MD;  Location: [facility] ENDOSCOPY;  Service: Endoscopy;;   CATARACT EXTRACTION W/ INTRAOCULAR LENS  IMPLANT, BILATERAL  2009   COLONOSCOPY  2001, 05/02/2003, 01/28/11   2012: Right colon Crohn's, rectal polyp   COLONOSCOPY  03/31/2012   Procedure: COLONOSCOPY;  Surgeon: Beverley Fiedler, MD;  Location: [facility] ENDOSCOPY;  Service: Gastroenterology;  Laterality: N/A;   COLONOSCOPY WITH PROPOFOL N/A 05/31/2018   Procedure: COLONOSCOPY WITH PROPOFOL;  Surgeon: Rachael Fee, MD;  Location: [facility] ENDOSCOPY;  Service: Endoscopy;  Laterality: N/A;   ESOPHAGOGASTRODUODENOSCOPY  01/28/2011   Normal   FOOT SURGERY Right age 6   MOHS SURGERY Left 11/2013   left ankle parakerotosis    PERCUTANEOUS LIVER BIOPSY  2007 and 2008   PILONIDAL CYST EXCISION  age 67   PLACEMENT OF SETON N/A 11/06/2015   Procedure: PLACEMENT OF SETON;  Surgeon: Romie Levee, MD;  Location: [facility];  Service: General;  Laterality: N/A;   PLACEMENT OF SETON  07/2018   at [facility]   RECTAL EXAM UNDER ANESTHESIA N/A 01/18/2019   Procedure: ANAL EXAM UNDER ANESTHESIA,  INCISION AND DRAINAGE, SETON PLACEMENT;  Surgeon: Romie Levee, MD;  Location: [facility];  Service: General;  Laterality: N/A;   UPPER GASTROINTESTINAL ENDOSCOPY      Current Medications: Current Facility-Administered Medications  Medication Dose Route Frequency Provider Last Rate Last Admin   acetaminophen (TYLENOL) tablet 650 mg  650 mg Oral Q6H PRN Motley-Mangrum, Jadeka A, PMHNP       alum & mag hydroxide-simeth (MAALOX/MYLANTA)  200-200-20 MG/5ML suspension 30 mL  30 mL Oral Q4H PRN Motley-Mangrum, Jadeka A, PMHNP       ARIPiprazole (ABILIFY) tablet 5 mg  5 mg Oral QHS Verner Chol, MD   5 mg at 04/29/23 2123   doxycycline (VIBRA-TABS) tablet 100 mg  100 mg Oral Q12H Floydene Flock, MD   100 mg at 04/30/23 0827   feeding supplement (ENSURE ENLIVE / ENSURE PLUS) liquid 237 mL  1 Bottle Oral TID BM Verner Chol, MD   237 mL at 04/30/23 1333   hydrOXYzine (ATARAX) tablet 25 mg  25 mg Oral TID PRN Motley-Mangrum, Ezra Sites, PMHNP   25 mg at 04/30/23 1648   liver oil-zinc oxide (DESITIN) 40 % ointment   Topical Q12H PRN Verner Chol, MD   Given at 04/25/23 1733   LORazepam (ATIVAN) injection 1 mg  1 mg Intramuscular Q4H PRN Remington, Amber E, NP       LORazepam (ATIVAN) injection 2 mg  2 mg Intramuscular TID PRN Motley-Mangrum, Geralynn Ochs A, PMHNP       LORazepam (ATIVAN) tablet 1 mg  1 mg Oral Q4H PRN Melanie Crazier, Amber E, NP   1 mg at 04/30/23 0827   magnesium hydroxide (MILK OF MAGNESIA) suspension 30 mL  30 mL Oral Daily PRN Motley-Mangrum, Jadeka A, PMHNP       multivitamin with minerals tablet 1 tablet  1 tablet Oral Daily Verner Chol, MD   1 tablet at 04/30/23 0827   ondansetron (ZOFRAN) injection 4 mg  4 mg Intravenous Q6H PRN Verner Chol, MD   4 mg at 04/16/23 0905   ondansetron (ZOFRAN-ODT) disintegrating tablet 4 mg  4 mg Oral Q8H PRN Verner Chol, MD       predniSONE (DELTASONE) tablet 30 mg  30 mg Oral Q breakfast Motley-Mangrum, Jadeka A, PMHNP   30 mg at 04/30/23 0828   venlafaxine XR (EFFEXOR-XR) 24 hr capsule 75 mg  75 mg Oral Q breakfast Verner Chol, MD   75 mg at 04/30/23 [phone]  Lab Results:  No results found. However, due to the size of the patient record, not all encounters were searched. Please check Results Review for a complete set of results.    Blood Alcohol level:  Lab Results  Component Value Date   ETH <10 04/13/2023   ETH <10 03/28/2023    Metabolic Disorder  Labs: Lab Results  Component Value Date   HGBA1C 4.8 04/03/2023   MPG 91.06 04/03/2023   No results found for: "PROLACTIN" Lab Results  Component Value Date   CHOL 209 (H) 04/03/2023   TRIG 75 04/03/2023   HDL 65 04/03/2023   CHOLHDL 3.2 04/03/2023   VLDL 15 04/03/2023   LDLCALC 129 (H) 04/03/2023    Physical Findings: AIMS:  , ,  ,  ,    CIWA:    COWS:      Psychiatric Specialty Exam:  Presentation  General Appearance:  Appropriate for Environment; Casual  Eye Contact: Fair  Speech: Clear and Coherent  Speech Volume: Normal    Mood and Affect  Mood: " Little better"  Affect: Constricted   Thought Process  Thought Processes: Coherent  Descriptions of Associations:Intact  Orientation:Full (Time, Place and Person)  Thought Content:Logical  Hallucinations:Hallucinations: None  Ideas of Reference:None  Suicidal Thoughts:Suicidal Thoughts: No  Homicidal Thoughts:Homicidal Thoughts: No   Sensorium  Memory: Immediate Fair; Recent Fair; Remote Fair  Judgment: Impaired  Insight: Shallow   Executive Functions  Concentration: Fair  Attention Span: Fair  Recall: [employer] of Knowledge: Fair  Language: Fair   Psychomotor Activity  Psychomotor Activity: Psychomotor Activity: Normal  Musculoskeletal: Strength & Muscle Tone: decreased Gait & Station: Improved, patient walks with the help of a walker Assets  Assets: Manufacturing systems engineer; Desire for Improvement; Resilience    Physical Exam: Physical Exam Vitals and nursing note reviewed.  HENT:     Head: Normocephalic.     Nose: Nose normal.     Mouth/Throat:     Mouth: Mucous membranes are moist.  Eyes:     Pupils: Pupils are equal, round, and reactive to light.  Cardiovascular:     Rate and Rhythm: Normal rate.     Pulses: Normal pulses.  Pulmonary:     Breath sounds: Normal breath sounds.  Abdominal:     General: Bowel sounds are normal.  Neurological:      General: No focal deficit present.     Mental Status: He is alert.    Review of Systems  Constitutional:  Positive for malaise/fatigue.  HENT: Negative.    Eyes: Negative.   Cardiovascular: Negative.   Gastrointestinal: Negative.   Skin: Negative.   Blood pressure 104/73, pulse 84, temperature 98.4 F (36.9 C), resp. rate 14, height 6\' 1"  (1.854 m), weight 60.6 kg, SpO2 93%. Body mass index is 17.61 kg/m.  Diagnosis: Principal Problem:   MDD (major depressive disorder), recurrent severe, without psychosis ([name]) Active Problems:   GAD (generalized anxiety disorder)   Tremor  Clinical decision making-patient is currently admitted after a lethal suicide attempt by overdosing on her antidepressants.  Patient is noted to have disabling physical problems including Crohn's disease, chronic pain, physical weakness.  Patient continues to struggle with hopelessness, anhedonia, severe anxiety and panic attacks.  His suicidal ideation have improved to intermittent occurrence and denies any hallucinations.  We continue to adjust the medications to help with his treatment resistant depression.  He continues to need inpatient psychiatric hospitalization. PLAN: Safety and Monitoring:  -- Voluntary admission to inpatient psychiatric  unit for safety, stabilization and treatment  -- Daily contact with patient to assess and evaluate symptoms and progress in treatment  -- Patient's case to be discussed in multi-disciplinary team meeting  -- Observation Level : q15 minute checks  -- Vital signs:  q12 hours  -- Precautions: suicide, elopement, and assault -- Encouraged patient to participate in unit milieu and in scheduled group therapies  2. Psychiatric Diagnoses and Treatment:  Effexor Xr 75 mg daily  Discussed Abilify 5 mg at bedtime for mood stabilization given the intensity of depression.  Discussed thoroughly the side effects risk benefits of Abilify and patient gave consent for the  medication.  Home medications were discontinued during this admission due to inefficacy-Lexapro, Buspar, Remeron                    -- Metabolic profile X3K 4.8; LDL 440; T Cholesterol 209  EKG 04/26/2023 normal sinus rhythm with QTc 423   3. Medical Issues Being Addressed:   serotonin syndrome-resolved Unstable gait-PT OT involved Chronic fatigue-will check TSH and thyroid antibodies to rule out any autoimmune problems  4. Discharge Planning:   -- Social work and case management to assist with discharge planning and identification of hospital follow-up needs prior to discharge  -- Estimated LOS: 3-4 days  Lewanda Rife, MD 04/30/2023, 7:06 PM

## 2023-05-01 DIAGNOSIS — F332 Major depressive disorder, recurrent severe without psychotic features: Secondary | ICD-10-CM | POA: Diagnosis not present

## 2023-05-01 NOTE — Progress Notes (Signed)
   05/01/23 1300  Psych Admission Type (Psych Patients Only)  Admission Status Involuntary  Psychosocial Assessment  Patient Complaints Anxiety;Depression  Eye Contact Fair  Facial Expression Sad  Affect Sad  Speech Soft  Interaction Minimal  Motor Activity Slow;Unsteady  Appearance/Hygiene In scrubs  Behavior Characteristics Cooperative  Mood Depressed  Thought Process  Coherency WDL  Content WDL  Delusions None reported or observed  Perception WDL  Hallucination None reported or observed  Judgment Limited  Confusion None  Danger to Self  Current suicidal ideation? Denies  Agreement Not to Harm Self Yes  Description of Agreement verbal  Danger to Others  Danger to Others None reported or observed

## 2023-05-01 NOTE — Progress Notes (Signed)
 Patient comes to the nurses station to show this nurse his right arm. Near the antecubital area is noted to be a hematoma - like bruise. Patient stated that he woke up and saw his arm like this. Patient denies pain or discomfort. MD informed. Instructions: trace area to see if it increases by tomorrow and avoid blood pressures on that arm.

## 2023-05-01 NOTE — Plan of Care (Signed)
   Problem: Activity: Goal: Interest or engagement in activities will improve Outcome: Not Progressing Goal: Sleeping patterns will improve Outcome: Not Progressing

## 2023-05-01 NOTE — Group Note (Signed)
 Date:  05/01/2023 Time:  9:49 AM  Group Topic/Focus:  Making Healthy Choices:   The focus of this group is to help patients identify negative/unhealthy choices they were using prior to admission and identify positive/healthier coping strategies to replace them upon discharge.    Participation Level:  Did Not Attend   Rodena Goldmann 05/01/2023, 9:49 AM

## 2023-05-01 NOTE — Progress Notes (Signed)
 [facility] MD Progress Note  05/01/2023  [name]  MRN:  [id number]  [name] is a 59 year old Caucasian male with a history of MDD, anxiety, Crohn's disease, and chronic mesenteric ischemia who presented to [facility] emergency department on 04/13/23 at 0404 for suicide attempt by ingestion of 28 tablets of 10mg  Lexapro at 0200. Per ED documentation, patient had some mild symptoms of serotonin syndrome on arrival, including acute agitation and clonus, with symptomatic improvement with benzodiazepines. Did have a return of symptoms requiring additional dose of lorazepam. Poison control advised the total amount of ingested Lexapro likely is not enough to cause serotonin syndrome by itself and they recommended 8-hour cardiac monitoring. Qtc 498.  Subjective:  Chart reviewed, case discussed in multidisciplinary meeting, patient seen during rounds.  Staff reports patient is doing better on the unit.  Patient is more visible on the unit.  Staff reports that the patient brought to staff changed attention this morning that he has a big hematoma on his right arm.  Today during assessment patient reports feeling "better" mood.  He is concerned about hematoma on his right arm.  Patient said that he noticed this today in the morning when he woke up.  He reports that it was swollen but now the swelling is down.  Patient was informed that the provider consult hospitalist to look at it.  Today the patient he denies suicide thoughts.  Patient reports his sleep and appetite has improved.  He feels hopeful.   He denies auditory/visual hallucinations.  He is taking his medications with no reported side effects.  Appetite:  Fair  Past Psychiatric History: see h&P Family History:  Family History  Problem Relation Age of Onset   Heart disease Father    Thyroid cancer Father    Allergies Father    Clotting disorder Father    Breast cancer Mother    Stomach cancer Mother    Colon cancer Neg Hx     Esophageal cancer Neg Hx    Rectal cancer Neg Hx    Social History:  Social History   Substance and Sexual Activity  Alcohol Use No   Alcohol/week: 0.0 standard drinks of alcohol     Social History   Substance and Sexual Activity  Drug Use Not Currently   Types: Cocaine, Marijuana   Comment: QUIT USING DRUGS IN 1997    Social History   Socioeconomic History   Marital status: Single    Spouse name: Not on file   Number of children: 1   Years of education: Not on file   Highest education level: Not on file  Occupational History   Occupation: Disability   Tobacco Use   Smoking status: Former    Current packs/day: 0.00    Average packs/day: 1 pack/day for 15.0 years (15.0 ttl pk-yrs)    Types: Cigarettes    Start date: 03/28/1982    Quit date: 03/28/1997    Years since quitting: 26.1   Smokeless tobacco: Never  Vaping Use   Vaping status: Never Used  Substance and Sexual Activity   Alcohol use: No    Alcohol/week: 0.0 standard drinks of alcohol   Drug use: Not Currently    Types: Cocaine, Marijuana    Comment: QUIT USING DRUGS IN 1997   Sexual activity: Not Currently  Other Topics Concern   Not on file  Social History Narrative   Single, history of substance abuse in recovery   Previously occupied on medical disability   1  child  Daughter Wellsite geologist, lives and works in [place]   Elderly parents involved and he helps them   1 brother   Social Drivers of Corporate investment banker Strain: Low Risk  (08/28/2019)   Overall Financial Resource Strain (CARDIA)    Difficulty of Paying Living Expenses: Not hard at all  Food Insecurity: Food Insecurity Present (04/13/2023)   Hunger Vital Sign    Worried About Running Out of Food in the Last Year: Never true    Ran Out of Food in the Last Year: Sometimes true  Transportation Needs: No Transportation Needs (04/13/2023)   PRAPARE - Administrator, Civil Service (Medical): No    Lack of Transportation  (Non-Medical): No  Physical Activity: Insufficiently Active (11/08/2019)   Exercise Vital Sign    Days of Exercise per Week: 3 days    Minutes of Exercise per Session: 30 min  Stress: Stress Concern Present (08/28/2019)   [employer] of Occupational Health - Occupational Stress Questionnaire    Feeling of Stress : Very much  Social Connections: Socially Isolated (03/28/2023)   Social Connection and Isolation Panel [NHANES]    Frequency of Communication with Friends and Family: Three times a week    Frequency of Social Gatherings with Friends and Family: Twice a week    Attends Religious Services: Never    Database administrator or Organizations: No    Attends Engineer, structural: Never    Marital Status: Divorced   Past Medical History:  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 06/15/2018   Cancer of skin of neck    Cataract    Chronic mesenteric ischemia ([name])    Chronic pain syndrome 07/09/2016   Crohn's disease of small and large intestines ([name])    followed by dr Baldo Ash gessner   Dairy product intolerance    Diarrhea, functional    Family history of adverse reaction to anesthesia    Grover's disease    transient acantholytic dermatosis   History of alcohol abuse    History of basal cell carcinoma excision    2013 left leg   History of Clostridium difficile    10/ 2014   History of multiple concussions    x6   last one Jan 2017 per pt--  no residual   History of substance abuse ([name])    quit 1997 per pt   History of suicide attempt    05-18-2012  overdose /  failure to thrive   Iron deficiency anemia due to chronic blood loss    Major depression, recurrent, chronic ([name])    Osteopenia    Personal history of adenomatous  colonic polyps 12/2010, 03/2012   12/2010 - 8 mm serrated adenoma of rectum   Portal vein thrombosis 03/21/2015   right   Primary sclerosing cholangitis    ? 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   Substance abuse ([name]) 1997   Alcohol   Vitamin A deficiency 05/23/2018   Vitamin B6 deficiency 07/27/2018   Vitamin C deficiency 05/23/2018    Past Surgical History:  Procedure Laterality Date   ABDOMINAL  AORTAGRAM N/A 03/29/2012   Procedure: ABDOMINAL AORTAGRAM;  Surgeon: Nada Libman, MD;  Location: [facility] CATH LAB;  Service: Cardiovascular;  Laterality: N/A;   ADENOIDECTOMY  age 61   BIOPSY  05/31/2018   Procedure: BIOPSY;  Surgeon: Rachael Fee, MD;  Location: [facility] ENDOSCOPY;  Service: Endoscopy;;   CATARACT EXTRACTION W/ INTRAOCULAR LENS  IMPLANT, BILATERAL  2009   COLONOSCOPY  2001, 05/02/2003, 01/28/11   2012: Right colon Crohn's, rectal polyp   COLONOSCOPY  03/31/2012   Procedure: COLONOSCOPY;  Surgeon: Beverley Fiedler, MD;  Location: [facility] ENDOSCOPY;  Service: Gastroenterology;  Laterality: N/A;   COLONOSCOPY WITH PROPOFOL N/A 05/31/2018   Procedure: COLONOSCOPY WITH PROPOFOL;  Surgeon: Rachael Fee, MD;  Location: [facility] ENDOSCOPY;  Service: Endoscopy;  Laterality: N/A;   ESOPHAGOGASTRODUODENOSCOPY  01/28/2011   Normal   FOOT SURGERY Right age 59   MOHS SURGERY Left 11/2013   left ankle parakerotosis    PERCUTANEOUS LIVER BIOPSY  2007 and 2008   PILONIDAL CYST EXCISION  age 27   PLACEMENT OF SETON N/A 11/06/2015   Procedure: PLACEMENT OF SETON;  Surgeon: Romie Levee, MD;  Location: [facility];  Service: General;  Laterality: N/A;   PLACEMENT OF SETON  07/2018   at [facility]   RECTAL EXAM UNDER ANESTHESIA N/A 01/18/2019   Procedure: ANAL EXAM UNDER ANESTHESIA,  INCISION AND DRAINAGE, SETON PLACEMENT;  Surgeon: Romie Levee, MD;  Location: [facility];  Service: General;   Laterality: N/A;   UPPER GASTROINTESTINAL ENDOSCOPY      Current Medications: Current Facility-Administered Medications  Medication Dose Route Frequency Provider Last Rate Last Admin   acetaminophen (TYLENOL) tablet 650 mg  650 mg Oral Q6H PRN Motley-Mangrum, Jadeka A, PMHNP       alum & mag hydroxide-simeth (MAALOX/MYLANTA) 200-200-20 MG/5ML suspension 30 mL  30 mL Oral Q4H PRN Motley-Mangrum, Jadeka A, PMHNP       ARIPiprazole (ABILIFY) tablet 5 mg  5 mg Oral QHS Verner Chol, MD   5 mg at 04/30/23 2051   doxycycline (VIBRA-TABS) tablet 100 mg  100 mg Oral Q12H Floydene Flock, MD   100 mg at 05/01/23 1610   feeding supplement (ENSURE ENLIVE / ENSURE PLUS) liquid 237 mL  1 Bottle Oral TID BM Verner Chol, MD   237 mL at 04/30/23 2050   hydrOXYzine (ATARAX) tablet 25 mg  25 mg Oral TID PRN Motley-Mangrum, Ezra Sites, PMHNP   25 mg at 05/01/23 0024   liver oil-zinc oxide (DESITIN) 40 % ointment   Topical Q12H PRN Verner Chol, MD   Given at 04/25/23 1733   LORazepam (ATIVAN) injection 1 mg  1 mg Intramuscular Q4H PRN Remington, Amber E, NP       LORazepam (ATIVAN) injection 2 mg  2 mg Intramuscular TID PRN Motley-Mangrum, Jadeka A, PMHNP       LORazepam (ATIVAN) tablet 1 mg  1 mg Oral Q4H PRN Remington, Amber E, NP   1 mg at 05/01/23 9604   magnesium hydroxide (MILK OF MAGNESIA) suspension 30 mL  30 mL Oral Daily PRN Motley-Mangrum, Jadeka A, PMHNP       multivitamin with minerals tablet 1 tablet  1 tablet Oral Daily Verner Chol, MD   1 tablet at 05/01/23 0833   ondansetron (ZOFRAN) injection 4 mg  4 mg Intravenous Q6H PRN Verner Chol, MD   4 mg at 04/16/23 0905   ondansetron (ZOFRAN-ODT) disintegrating tablet 4 mg  4 mg Oral Q8H  PRN Verner Chol, MD       predniSONE (DELTASONE) tablet 30 mg  30 mg Oral Q breakfast Motley-Mangrum, Jadeka A, PMHNP   30 mg at 05/01/23 1610   venlafaxine XR (EFFEXOR-XR) 24 hr capsule 75 mg  75 mg Oral Q breakfast Verner Chol, MD   75 mg at  05/01/23 9604        Blood Alcohol level:  Lab Results  Component Value Date   ETH <10 04/13/2023   ETH <10 03/28/2023    Metabolic Disorder Labs: Lab Results  Component Value Date   HGBA1C 4.8 04/03/2023   MPG 91.06 04/03/2023   No results found for: "PROLACTIN" Lab Results  Component Value Date   CHOL 209 (H) 04/03/2023   TRIG 75 04/03/2023   HDL 65 04/03/2023   CHOLHDL 3.2 04/03/2023   VLDL 15 04/03/2023   LDLCALC 129 (H) 04/03/2023    Psychiatric Specialty Exam:  Presentation  General Appearance:  Appropriate for Environment; Casual   Eye Contact: Fair   Speech: Clear and Coherent   Speech Volume: Normal       Mood and Affect  Mood: " better"   Affect: Less constricted     Thought Process  Thought Processes: Coherent   Descriptions of Associations:Intact   Orientation:Full (Time, Place and Person)   Thought Content:Logical   Hallucinations:Hallucinations: None   Ideas of Reference:None   Suicidal Thoughts:Suicidal Thoughts: No   Homicidal Thoughts:Homicidal Thoughts: No     Sensorium  Memory: Immediate Fair; Recent Fair; Remote Fair   Judgment: Impaired   Insight: Shallow     Executive Functions  Concentration: Fair   Attention Span: Fair   Recall: [employer] of Knowledge: Fair   Language: Fair     Psychomotor Activity  Psychomotor Activity: Psychomotor Activity: Normal   Musculoskeletal: Strength & Muscle Tone: decreased Gait & Station: Improved, patient walks with the help of a walker Assets  Assets: Manufacturing systems engineer; Desire for Improvement; Resilience   Physical Exam: Physical Exam Vitals and nursing note reviewed.  HENT:     Head: Normocephalic.     Nose: No congestion.     Mouth/Throat:     Mouth: Mucous membranes are moist.  Eyes:     Pupils: Pupils are equal, round, and reactive to light.  Cardiovascular:     Rate and Rhythm: Normal rate.     Pulses: Normal pulses.   Pulmonary:     Breath sounds: Normal breath sounds.  Abdominal:     General: Bowel sounds are normal.  Neurological:     General: No focal deficit present.     Mental Status: He is alert.    Review of Systems  Constitutional:  Positive for malaise/fatigue.  HENT: Negative.    Eyes: Negative.   Cardiovascular: Negative.   Gastrointestinal: Negative.   Skin: Negative.   Blood pressure 100/68, pulse 68, temperature 98.1 F (36.7 C), resp. rate 18, height 6\' 1"  (1.854 m), weight 60.6 kg, SpO2 100%. Body mass index is 17.61 kg/m.  Diagnosis: Principal Problem:   MDD (major depressive disorder), recurrent severe, without psychosis ([name]) Active Problems:   GAD (generalized anxiety disorder)   Tremor  Clinical decision making-patient is currently admitted after a lethal suicide attempt by overdosing on her antidepressants.  Patient is noted to have disabling physical problems including Crohn's disease, chronic pain, physical weakness.  Patient continues to struggle with hopelessness, anhedonia, severe anxiety and panic attacks.  His suicidal ideation have improved to intermittent occurrence  and denies any hallucinations.  We continue to adjust the medications to help with his treatment resistant depression.  He continues to need inpatient psychiatric hospitalization. PLAN: Safety and Monitoring:  -- Voluntary admission to inpatient psychiatric unit for safety, stabilization and treatment  -- Daily contact with patient to assess and evaluate symptoms and progress in treatment  -- Patient's case to be discussed in multi-disciplinary team meeting  -- Observation Level : q15 minute checks  -- Vital signs:  q12 hours  -- Precautions: suicide, elopement, and assault -- Encouraged patient to participate in unit milieu and in scheduled group therapies  2. Psychiatric Diagnoses and Treatment:  Effexor Xr 75 mg daily  Discussed Abilify 5 mg at bedtime for mood stabilization given the intensity  of depression.  Discussed thoroughly the side effects risk benefits of Abilify and patient gave consent for the medication.  Home medications were discontinued during this admission due to inefficacy-Lexapro, Buspar, Remeron                    -- Metabolic profile A2Z 4.8; LDL 308; T Cholesterol 209  EKG 04/26/2023 normal sinus rhythm with QTc 423   3. Medical Issues Being Addressed:   serotonin syndrome-resolved Unstable gait- Improving Hematoma: hospitalist consulted, they recommended to watch it for now, to call back if it gets worse  4. Discharge Planning:   -- Social work and case management to assist with discharge planning and identification of hospital follow-up needs prior to discharge  -- Estimated LOS: 3-4 days  Lewanda Rife, MD

## 2023-05-01 NOTE — Progress Notes (Signed)
 Pt continues to endorse anxiety rated moderate; Atarax given; also endorsed depression rated moderate.  He dined SI/HI.  Visible in milieu, limited interaction with peers.  Remains care compliant.  Pt states "I am doing better."    04/30/23 1126  Psych Admission Type (Psych Patients Only)  Admission Status Involuntary  Psychosocial Assessment  Patient Complaints Anxiety;Depression  Eye Contact Fair  Facial Expression Sad  Affect Flat;Sad  Speech Soft  Interaction Minimal  Motor Activity Slow;Unsteady  Appearance/Hygiene In scrubs  Behavior Characteristics Cooperative  Mood Depressed  Thought Process  Coherency WDL  Content WDL  Delusions None reported or observed  Perception WDL  Hallucination None reported or observed  Judgment Limited  Confusion None  Danger to Self  Current suicidal ideation? Denies  Danger to Others  Danger to Others None reported or observed

## 2023-05-01 NOTE — Group Note (Signed)
 LCSW Group Therapy Note   Group Date: 05/01/2023 Start Time: 1300 End Time: 1400   Type of Therapy and Topic:  Group Therapy:   Participation Level:  Did Not Attend  Description of Group:   Therapeutic Goals:  1.     Summary of Patient Progress:    Did not attend  Therapeutic Modalities:   Alanda Amass 05/01/2023  1:59 PM

## 2023-05-02 DIAGNOSIS — F332 Major depressive disorder, recurrent severe without psychotic features: Secondary | ICD-10-CM | POA: Diagnosis not present

## 2023-05-02 MED ORDER — NYSTATIN 100000 UNIT/GM EX POWD
Freq: Two times a day (BID) | CUTANEOUS | Status: DC
  Filled 2023-05-02: qty 15

## 2023-05-02 NOTE — Progress Notes (Signed)
 Physical Therapy Treatment Patient Details Name: [name] MRN: [id number] DOB: August 25, 1965 Today's Date: 05/02/2023   History of Present Illness [name] is a 58 year old Caucasian male with a history of MDD, anxiety, Crohn's disease, and chronic mesenteric ischemia who presented to ED on 04/13/23 at 0404 for suicide attempt by ingestion of 28 tablets of 10mg  Lexapro at 0200. Patient sent to behavior Med unit.    PT Comments  Pt resting in bed. No tremors noted.  He is able to get up and walk extended distances on unit today and practice multiple sit to stand transfers from chairs in dayroom with and without arms with no LOB with turns or assist to stand.  He opts to return to bed after session.  He does continue to self limit some but overall is progressing well and walking on the unit with RW independently.     If plan is discharge home, recommend the following: Assist for transportation;Help with stairs or ramp for entrance;Assistance with cooking/housework;Direct supervision/assist for financial management;Direct supervision/assist for medications management   Can travel by private vehicle        Equipment Recommendations  Rolling walker (2 wheels)    Recommendations for Other Services       Precautions / Restrictions Precautions Precautions: Fall Restrictions Weight Bearing Restrictions Per Provider Order: No     Mobility  Bed Mobility Overal bed mobility: Modified Independent               Patient Response: Cooperative  Transfers Overall transfer level: Modified independent                      Ambulation/Gait Ambulation/Gait assistance: Modified independent (Device/Increase time), Supervision Gait Distance (Feet): 300 Feet Assistive device: Rolling walker (2 wheels) Gait Pattern/deviations: Decreased step length - right, Decreased step length - left, Decreased stride length, Decreased dorsiflexion - right, Decreased dorsiflexion - left,  Trunk flexed Gait velocity: decr     General Gait Details: progressing well with distance and stability   Stairs             Wheelchair Mobility     Tilt Bed Tilt Bed Patient Response: Cooperative  Modified Rankin (Stroke Patients Only)       Balance Overall balance assessment: Mild deficits observed, not formally tested Sitting-balance support: Feet supported Sitting balance-Leahy Scale: Good     Standing balance support: Bilateral upper extremity supported Standing balance-Leahy Scale: Fair Standing balance comment: relies on RW for support but overall doing well                            Communication    Cognition Arousal: Alert Behavior During Therapy: Flat affect   PT - Cognitive impairments: No apparent impairments                                Cueing    Exercises      General Comments        Pertinent Vitals/Pain Pain Assessment Pain Assessment: No/denies pain    Home Living                          Prior Function            PT Goals (current goals can now be found in the care plan section) Progress towards PT goals: Progressing toward  goals    Frequency    Min 1X/week      PT Plan      Co-evaluation              AM-PAC PT "6 Clicks" Mobility   Outcome Measure  Help needed turning from your back to your side while in a flat bed without using bedrails?: None Help needed moving from lying on your back to sitting on the side of a flat bed without using bedrails?: None Help needed moving to and from a bed to a chair (including a wheelchair)?: None Help needed standing up from a chair using your arms (e.g., wheelchair or bedside chair)?: None Help needed to walk in hospital room?: None Help needed climbing 3-5 steps with a railing? : A Little 6 Click Score: 23    End of Session Equipment Utilized During Treatment: Gait belt Activity Tolerance: Patient tolerated treatment  well Patient left: in bed Nurse Communication: Mobility status PT Visit Diagnosis: Muscle weakness (generalized) (M62.81);Difficulty in walking, not elsewhere classified (R26.2)     Time: 4098-1191 PT Time Calculation (min) (ACUTE ONLY): 12 min  Charges:    $Gait Training: 8-22 mins PT General Charges $$ ACUTE PT VISIT: 1 Visit                   Danielle Dess, PTA 05/02/23, 2:48 PM

## 2023-05-02 NOTE — Progress Notes (Addendum)
 Patient is an involuntary admission to [facility] on 04/13/23 for MDD with SI attempt.  He ambulates with a walker.  Denies SI, HI, AVH but endorses anxiety at 7/10 and depression 9/10.  Patient medicated at 1349 with hydroxyzine for his anxiety.  Currently awaiting placement for discharge.  Will continue to monitor.  Patient has a redness in his rectal area with what he calls a "boil" but looks more like a hemorrhoid.  He is provided desitin for this and it does seem like it is healing.  He also has a bruised inner right AC.  Denies injury or pain.  This is the same arm that he tucks under his head while laying on his right side. No heat or inflammation not. Will continue to monitor.

## 2023-05-02 NOTE — BHH Counselor (Signed)
 CSW sent referral to [facility] to find appropriate placement for pt.   CSW received confirmation email.    Reynaldo Minium, [name], [place] 05/02/2023 2:14 PM

## 2023-05-02 NOTE — Group Note (Signed)
 Date:  05/02/2023 Time:  12:49 AM  Group Topic/Focus:  Wrap-Up Group:   The focus of this group is to help patients review their daily goal of treatment and discuss progress on daily workbooks.    Participation Level:  Active  Participation Quality:  Appropriate  Affect:  Appropriate  Cognitive:  Appropriate  Insight: Improving  Engagement in Group:  Improving  Modes of Intervention:  Discussion  Additional Comments:    [name] 05/02/2023, 12:49 AM

## 2023-05-02 NOTE — Group Note (Deleted)
 Recreation Therapy Group Note   Group Topic:Goal Setting  Group Date: 05/02/2023 Start Time: 1040 End Time: 1140 Facilitators: Rosina Lowenstein, LRT Location:  Craft Room  Group Description: Product/process development scientist. Patients were given many different magazines, a glue stick, markers, and a piece of cardstock paper. LRT and pts discussed the importance of having goals in life. LRT and pts discussed the difference between short-term and long-term goals, as well as what a SMART goal is. LRT encouraged pts to create a vision board, with images they picked and then cut out with safety scissors from the magazine, for themselves, that capture their short and long-term goals. LRT encouraged pts to show and explain their vision board to the group.   Goal Area(s) Addressed:  Patient will gain knowledge of short vs. long term goals.  Patient will identify goals for themselves. Patient will practice setting SMART goals. Patient will verbalize their goals to LRT and peers.       Affect/Mood: {RT BHH Affect/Mood:26271}   Participation Level: {RT BHH Participation Level:26267}   Participation Quality: {RT BHH Participation Quality:26268}   Behavior: {RT BHH Group Behavior:26269}   Speech/Thought Process: {RT BHH Speech/Thought:26276}   Insight: {RT BHH Insight:26272}   Judgement: {RT BHH Judgement:26278}   Modes of Intervention: {RT BHH Modes of Intervention:26277}   Patient Response to Interventions:  {RT BHH Patient Response to Intervention:26274}   Education Outcome:  {RT BHH Education Outcome:26279}   Clinical Observations/Individualized Feedback: *** was *** in their participation of session activities and group discussion. Pt identified ***   Plan: {RT BHH Tx VHQI:69629}   Rosina Lowenstein, LRT,  05/02/2023 12:58 PM

## 2023-05-02 NOTE — Progress Notes (Signed)
 [facility] MD Progress Note  05/02/2023  [name]  MRN:  [id number]  [name] is a 58 year old Caucasian male with a history of MDD, anxiety, Crohn's disease, and chronic mesenteric ischemia who presented to [facility] emergency department on 04/13/23 at 0404 for suicide attempt by ingestion of 28 tablets of 10mg  Lexapro at 0200. Per ED documentation, patient had some mild symptoms of serotonin syndrome on arrival, including acute agitation and clonus, with symptomatic improvement with benzodiazepines. Did have a return of symptoms requiring additional dose of lorazepam. Poison control advised the total amount of ingested Lexapro likely is not enough to cause serotonin syndrome by itself and they recommended 8-hour cardiac monitoring. Qtc 498.  Subjective:  Chart reviewed, case discussed in multidisciplinary meeting, patient seen during rounds.  Staff reports patient is doing better on the unit.  Patient is more visible on the unit.  Today during assessment patient reports "good" mood.  He reports he slept good last night.  His hematoma is getting better.  Today the patient he denies suicide thoughts.  Patient reports his sleep and appetite has improved.  He feels hopeful.   We discussed patient's discharge plans, patient was encouraged to work with Child psychotherapist and his brother on home health care.  He denies auditory/visual hallucinations.  He is taking his medications with no reported side effects.  Appetite:  Fair  Past Psychiatric History: see h&P Family History:  Family History  Problem Relation Age of Onset   Heart disease Father    Thyroid cancer Father    Allergies Father    Clotting disorder Father    Breast cancer Mother    Stomach cancer Mother    Colon cancer Neg Hx    Esophageal cancer Neg Hx    Rectal cancer Neg Hx    Social History:  Social History   Substance and Sexual Activity  Alcohol Use No   Alcohol/week: 0.0 standard drinks of alcohol     Social  History   Substance and Sexual Activity  Drug Use Not Currently   Types: Cocaine, Marijuana   Comment: QUIT USING DRUGS IN 1997    Social History   Socioeconomic History   Marital status: Single    Spouse name: Not on file   Number of children: 1   Years of education: Not on file   Highest education level: Not on file  Occupational History   Occupation: Disability   Tobacco Use   Smoking status: Former    Current packs/day: 0.00    Average packs/day: 1 pack/day for 15.0 years (15.0 ttl pk-yrs)    Types: Cigarettes    Start date: 03/28/1982    Quit date: 03/28/1997    Years since quitting: 26.1   Smokeless tobacco: Never  Vaping Use   Vaping status: Never Used  Substance and Sexual Activity   Alcohol use: No    Alcohol/week: 0.0 standard drinks of alcohol   Drug use: Not Currently    Types: Cocaine, Marijuana    Comment: QUIT USING DRUGS IN 1997   Sexual activity: Not Currently  Other Topics Concern   Not on file  Social History Narrative   Single, history of substance abuse in recovery   Previously occupied on medical disability   1 child  Daughter Wellsite geologist, lives and works in [place]   Elderly parents involved and he helps them   1 brother   Social Drivers of Corporate investment banker Strain: Low Risk  (08/28/2019)  Overall Financial Resource Strain (CARDIA)    Difficulty of Paying Living Expenses: Not hard at all  Food Insecurity: Food Insecurity Present (04/13/2023)   Hunger Vital Sign    Worried About Running Out of Food in the Last Year: Never true    Ran Out of Food in the Last Year: Sometimes true  Transportation Needs: No Transportation Needs (04/13/2023)   PRAPARE - Administrator, Civil Service (Medical): No    Lack of Transportation (Non-Medical): No  Physical Activity: Insufficiently Active (11/08/2019)   Exercise Vital Sign    Days of Exercise per Week: 3 days    Minutes of Exercise per Session: 30 min  Stress: Stress Concern Present  (08/28/2019)   [employer] of Occupational Health - Occupational Stress Questionnaire    Feeling of Stress : Very much  Social Connections: Socially Isolated (03/28/2023)   Social Connection and Isolation Panel [NHANES]    Frequency of Communication with Friends and Family: Three times a week    Frequency of Social Gatherings with Friends and Family: Twice a week    Attends Religious Services: Never    Database administrator or Organizations: No    Attends Engineer, structural: Never    Marital Status: Divorced   Past Medical History:  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 06/15/2018   Cancer of skin of neck    Cataract    Chronic mesenteric ischemia ([name])    Chronic pain syndrome 07/09/2016   Crohn's disease of small and large intestines ([name])    followed by dr Baldo Ash gessner   Dairy product intolerance    Diarrhea, functional    Family history of adverse reaction to anesthesia    Grover's disease    transient acantholytic dermatosis   History of alcohol abuse    History of basal cell carcinoma excision    2013 left leg   History of Clostridium difficile    10/ 2014   History of multiple concussions    x6   last one Jan 2017 per pt--  no residual   History of substance abuse ([name])    quit 1997 per pt   History of suicide attempt    05-18-2012  overdose /  failure to thrive   Iron deficiency anemia due to chronic blood loss    Major depression, recurrent, chronic ([name])    Osteopenia    Personal history of adenomatous colonic polyps 12/2010, 03/2012   12/2010 - 8 mm serrated adenoma of rectum   Portal vein thrombosis 03/21/2015   right   Primary sclerosing cholangitis    ? 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   Substance abuse ([name]) 1997   Alcohol   Vitamin A deficiency 05/23/2018   Vitamin B6 deficiency 07/27/2018   Vitamin C deficiency 05/23/2018    Past Surgical History:  Procedure Laterality Date   ABDOMINAL AORTAGRAM N/A 03/29/2012   Procedure: ABDOMINAL Ronny Flurry;  Surgeon: Nada Libman, MD;  Location: [facility] CATH LAB;  Service: Cardiovascular;  Laterality: N/A;   ADENOIDECTOMY  age 33   BIOPSY  05/31/2018   Procedure: BIOPSY;  Surgeon: Rachael Fee, MD;  Location: [facility] ENDOSCOPY;  Service: Endoscopy;;   CATARACT EXTRACTION W/ INTRAOCULAR LENS  IMPLANT, BILATERAL  2009   COLONOSCOPY  2001, 05/02/2003, 01/28/11   2012: Right colon Crohn's, rectal polyp   COLONOSCOPY  03/31/2012   Procedure: COLONOSCOPY;  Surgeon: Beverley Fiedler, MD;  Location: [facility] ENDOSCOPY;  Service: Gastroenterology;  Laterality: N/A;   COLONOSCOPY WITH PROPOFOL N/A 05/31/2018   Procedure: COLONOSCOPY WITH PROPOFOL;  Surgeon: Rachael Fee, MD;  Location: [facility] ENDOSCOPY;  Service: Endoscopy;  Laterality: N/A;   ESOPHAGOGASTRODUODENOSCOPY  01/28/2011   Normal   FOOT SURGERY Right age 3   MOHS SURGERY Left 11/2013   left ankle parakerotosis    PERCUTANEOUS LIVER BIOPSY  2007 and 2008   PILONIDAL CYST EXCISION  age 36   PLACEMENT OF SETON N/A 11/06/2015   Procedure: PLACEMENT OF SETON;  Surgeon: Romie Levee, MD;  Location: [facility];  Service: General;  Laterality: N/A;   PLACEMENT OF SETON  07/2018   at [facility]   RECTAL EXAM UNDER ANESTHESIA N/A 01/18/2019   Procedure: ANAL EXAM UNDER ANESTHESIA,  INCISION AND DRAINAGE, SETON PLACEMENT;  Surgeon: Romie Levee, MD;  Location: [facility];  Service: General;  Laterality: N/A;   UPPER GASTROINTESTINAL ENDOSCOPY      Current Medications: Current Facility-Administered Medications  Medication Dose Route Frequency Provider Last Rate Last Admin   acetaminophen (TYLENOL) tablet 650  mg  650 mg Oral Q6H PRN Motley-Mangrum, Jadeka A, PMHNP       alum & mag hydroxide-simeth (MAALOX/MYLANTA) 200-200-20 MG/5ML suspension 30 mL  30 mL Oral Q4H PRN Motley-Mangrum, Jadeka A, PMHNP       ARIPiprazole (ABILIFY) tablet 5 mg  5 mg Oral QHS Verner Chol, MD   5 mg at 05/01/23 2108   doxycycline (VIBRA-TABS) tablet 100 mg  100 mg Oral Q12H Floydene Flock, MD   100 mg at 05/02/23 0753   feeding supplement (ENSURE ENLIVE / ENSURE PLUS) liquid 237 mL  1 Bottle Oral TID BM Verner Chol, MD   237 mL at 05/02/23 1022   hydrOXYzine (ATARAX) tablet 25 mg  25 mg Oral TID PRN Motley-Mangrum, Ezra Sites, PMHNP   25 mg at 05/02/23 0043   liver oil-zinc oxide (DESITIN) 40 % ointment   Topical Q12H PRN Verner Chol, MD   Given at 04/25/23 1733   LORazepam (ATIVAN) injection 1 mg  1 mg Intramuscular Q4H PRN Remington, Amber E, NP       LORazepam (ATIVAN) injection 2 mg  2 mg Intramuscular TID PRN Motley-Mangrum, Geralynn Ochs A, PMHNP       LORazepam (ATIVAN) tablet 1 mg  1 mg Oral Q4H PRN Remington, Amber E, NP   1 mg at 05/02/23 0516   magnesium hydroxide (MILK OF MAGNESIA) suspension 30 mL  30 mL Oral Daily PRN Motley-Mangrum, Jadeka A, PMHNP       multivitamin with minerals tablet 1 tablet  1 tablet Oral Daily Verner Chol, MD   1 tablet at 05/02/23 0753   ondansetron (ZOFRAN) injection 4 mg  4 mg Intravenous Q6H PRN Verner Chol, MD   4 mg at 04/16/23 0905   ondansetron (ZOFRAN-ODT) disintegrating tablet 4 mg  4 mg Oral Q8H PRN Verner Chol, MD       predniSONE (DELTASONE) tablet 30 mg  30 mg Oral Q breakfast Motley-Mangrum, Jadeka A, PMHNP   30 mg at 05/02/23 0752   venlafaxine XR (EFFEXOR-XR) 24 hr capsule 75  mg  75 mg Oral Q breakfast Verner Chol, MD   75 mg at 05/02/23 0753        Blood Alcohol level:  Lab Results  Component Value Date   ETH <10 04/13/2023   ETH <10 03/28/2023    Metabolic Disorder Labs: Lab Results  Component Value Date   HGBA1C 4.8 04/03/2023   MPG  91.06 04/03/2023   No results found for: "PROLACTIN" Lab Results  Component Value Date   CHOL 209 (H) 04/03/2023   TRIG 75 04/03/2023   HDL 65 04/03/2023   CHOLHDL 3.2 04/03/2023   VLDL 15 04/03/2023   LDLCALC 129 (H) 04/03/2023    Psychiatric Specialty Exam:  Presentation  General Appearance:  Appropriate for Environment; Casual   Eye Contact: Fair   Speech: Clear and Coherent   Speech Volume: Normal       Mood and Affect  Mood: " better"   Affect: Less constricted     Thought Process  Thought Processes: Coherent   Descriptions of Associations:Intact   Orientation:Full (Time, Place and Person)   Thought Content:Logical   Hallucinations:Hallucinations: None   Ideas of Reference:None   Suicidal Thoughts:Suicidal Thoughts: No   Homicidal Thoughts:Homicidal Thoughts: No     Sensorium  Memory: Immediate Fair; Recent Fair; Remote Fair   Judgment: Impaired   Insight: Shallow     Executive Functions  Concentration: Fair   Attention Span: Fair   Recall: [employer] of Knowledge: Fair   Language: Fair     Psychomotor Activity  Psychomotor Activity: Psychomotor Activity: Normal   Musculoskeletal: Strength & Muscle Tone: decreased Gait & Station: Improved, patient walks with the help of a walker Assets  Assets: Manufacturing systems engineer; Desire for Improvement; Resilience   Physical Exam: Physical Exam Vitals and nursing note reviewed.  HENT:     Head: Normocephalic.     Nose: No congestion.     Mouth/Throat:     Mouth: Mucous membranes are moist.  Eyes:     Pupils: Pupils are equal, round, and reactive to light.  Cardiovascular:     Rate and Rhythm: Normal rate.     Pulses: Normal pulses.  Pulmonary:     Breath sounds: Normal breath sounds.  Abdominal:     General: Bowel sounds are normal.  Neurological:     General: No focal deficit present.     Mental Status: He is alert.    Review of Systems  Constitutional:   Positive for malaise/fatigue.  HENT: Negative.    Eyes: Negative.   Cardiovascular: Negative.   Gastrointestinal: Negative.   Skin: Negative.   Blood pressure (!) 129/92, pulse 72, temperature 97.6 F (36.4 C), resp. rate 15, height 6\' 1"  (1.854 m), weight 60.6 kg, SpO2 100%. Body mass index is 17.61 kg/m.  Diagnosis: Principal Problem:   MDD (major depressive disorder), recurrent severe, without psychosis ([name]) Active Problems:   GAD (generalized anxiety disorder)   Tremor  Clinical decision making-patient is currently admitted after a lethal suicide attempt by overdosing on her antidepressants.  Patient is noted to have disabling physical problems including Crohn's disease, chronic pain, physical weakness.  Patient continues to struggle with hopelessness, anhedonia, severe anxiety and panic attacks.  His suicidal ideation have improved to intermittent occurrence and denies any hallucinations.  We continue to adjust the medications to help with his treatment resistant depression.  He continues to need inpatient psychiatric hospitalization. PLAN: Safety and Monitoring:  -- Voluntary admission to inpatient psychiatric unit for safety,  stabilization and treatment  -- Daily contact with patient to assess and evaluate symptoms and progress in treatment  -- Patient's case to be discussed in multi-disciplinary team meeting  -- Observation Level : q15 minute checks  -- Vital signs:  q12 hours  -- Precautions: suicide, elopement, and assault -- Encouraged patient to participate in unit milieu and in scheduled group therapies  2. Psychiatric Diagnoses and Treatment:  Effexor Xr 75 mg daily  Discussed Abilify 5 mg at bedtime for mood stabilization given the intensity of depression.  Discussed thoroughly the side effects risk benefits of Abilify and patient gave consent for the medication.  Home medications were discontinued during this admission due to inefficacy-Lexapro, Buspar, Remeron                     -- Metabolic profile Z6X 4.8; LDL 096; T Cholesterol 209  EKG 04/26/2023 normal sinus rhythm with QTc 423   3. Medical Issues Being Addressed:   serotonin syndrome-resolved Unstable gait- Improving Hematoma: hospitalist consulted, they recommended to watch it for now, to call back if it gets worse  4. Discharge Planning:   -- Social work and case management to assist with discharge planning and identification of hospital follow-up needs prior to discharge  -- Discharge date to be determined, Child psychotherapist and patient's family is working on home health care services  Lewanda Rife, MD

## 2023-05-02 NOTE — Group Note (Signed)
 Recreation Therapy Group Note   Group Topic:Goal Setting  Group Date: 05/02/2023 Start Time: 1500 End Time: 1600 Facilitators: Rosina Lowenstein, LRT, CTRS Location:  Dayroom  Group Description: Product/process development scientist. Patients were given many different magazines, a glue stick, markers, and a piece of cardstock paper. LRT and pts discussed the importance of having goals in life. LRT and pts discussed the difference between short-term and long-term goals, as well as what a SMART goal is. LRT encouraged pts to create a vision board, with images they picked and then cut out with safety scissors from the magazine, for themselves, that capture their short and long-term goals. LRT encouraged pts to show and explain their vision board to the group.   Goal Area(s) Addressed:  Patient will gain knowledge of short vs. long term goals.  Patient will identify goals for themselves. Patient will practice setting SMART goals. Patient will verbalize their goals to LRT and peers.   Affect/Mood: N/A   Participation Level: Did not attend    Clinical Observations/Individualized Feedback: Patient did not attend group.   Plan: Continue to engage patient in RT group sessions 2-3x/week.   Rosina Lowenstein, LRT, CTRS 05/02/2023 4:51 PM

## 2023-05-02 NOTE — Progress Notes (Signed)
 Mood depressed, affect flat, continues to endorsed depression rated moderate, anxiety, moderately severe; Atarax and Ativan given during tenure of shift.  Pt unable to identify trigger, Denied SH/HI, AVH.  Pt remains care compliant.    05/01/23 2200  Psych Admission Type (Psych Patients Only)  Admission Status Involuntary  Psychosocial Assessment  Patient Complaints Anxiety;Depression  Eye Contact Fair  Facial Expression Anxious;Flat  Affect Anxious;Depressed  Interaction Minimal  Motor Activity Unsteady;Slow  Appearance/Hygiene In scrubs  Behavior Characteristics Cooperative  Mood Depressed;Anxious  Thought Process  Coherency WDL  Content WDL  Delusions None reported or observed  Perception WDL  Hallucination None reported or observed  Judgment Limited  Confusion None  Danger to Self  Current suicidal ideation? Denies  Agreement Not to Harm Self Yes  Description of Agreement Verbal  Danger to Others  Danger to Others None reported or observed

## 2023-05-03 DIAGNOSIS — F332 Major depressive disorder, recurrent severe without psychotic features: Secondary | ICD-10-CM | POA: Diagnosis not present

## 2023-05-03 NOTE — Progress Notes (Signed)
 [facility] MD Progress Note  05/03/2023  [name]  MRN:  [id number]  [name] is a 58 year old Caucasian male with a history of MDD, anxiety, Crohn's disease, and chronic mesenteric ischemia who presented to [facility] emergency department on 04/13/23 at 0404 for suicide attempt by ingestion of 28 tablets of 10mg  Lexapro at 0200. Per ED documentation, patient had some mild symptoms of serotonin syndrome on arrival, including acute agitation and clonus, with symptomatic improvement with benzodiazepines. Did have a return of symptoms requiring additional dose of lorazepam. Poison control advised the total amount of ingested Lexapro likely is not enough to cause serotonin syndrome by itself and they recommended 8-hour cardiac monitoring. Qtc 498.  Subjective:  Chart reviewed, case discussed in multidisciplinary meeting, patient seen during rounds.  Staff reports patient is doing better on the unit. Today during assessment patient reports "fine" mood.  No new events reported overnight. Today the patient denies suicide thoughts.  Patient reports his sleep and appetite has improved. We discussed patient's discharge plans, patient was encouraged to work with Child psychotherapist and his brother on home health care.  He denies auditory/visual hallucinations.  He is taking his medications with no reported side effects.  Appetite:  Fair  Past Psychiatric History: see h&P Family History:  Family History  Problem Relation Age of Onset   Heart disease Father    Thyroid cancer Father    Allergies Father    Clotting disorder Father    Breast cancer Mother    Stomach cancer Mother    Colon cancer Neg Hx    Esophageal cancer Neg Hx    Rectal cancer Neg Hx    Social History:  Social History   Substance and Sexual Activity  Alcohol Use No   Alcohol/week: 0.0 standard drinks of alcohol     Social History   Substance and Sexual Activity  Drug Use Not Currently   Types: Cocaine, Marijuana    Comment: QUIT USING DRUGS IN 1997    Social History   Socioeconomic History   Marital status: Single    Spouse name: Not on file   Number of children: 1   Years of education: Not on file   Highest education level: Not on file  Occupational History   Occupation: Disability   Tobacco Use   Smoking status: Former    Current packs/day: 0.00    Average packs/day: 1 pack/day for 15.0 years (15.0 ttl pk-yrs)    Types: Cigarettes    Start date: 03/28/1982    Quit date: 03/28/1997    Years since quitting: 26.1   Smokeless tobacco: Never  Vaping Use   Vaping status: Never Used  Substance and Sexual Activity   Alcohol use: No    Alcohol/week: 0.0 standard drinks of alcohol   Drug use: Not Currently    Types: Cocaine, Marijuana    Comment: QUIT USING DRUGS IN 1997   Sexual activity: Not Currently  Other Topics Concern   Not on file  Social History Narrative   Single, history of substance abuse in recovery   Previously occupied on medical disability   1 child  Daughter Wellsite geologist, lives and works in [place]   Elderly parents involved and he helps them   1 brother   Social Drivers of Corporate investment banker Strain: Low Risk  (08/28/2019)   Overall Financial Resource Strain (CARDIA)    Difficulty of Paying Living Expenses: Not hard at all  Food Insecurity: Food Insecurity Present (04/13/2023)  Hunger Vital Sign    Worried About Running Out of Food in the Last Year: Never true    Ran Out of Food in the Last Year: Sometimes true  Transportation Needs: No Transportation Needs (04/13/2023)   PRAPARE - Administrator, Civil Service (Medical): No    Lack of Transportation (Non-Medical): No  Physical Activity: Insufficiently Active (11/08/2019)   Exercise Vital Sign    Days of Exercise per Week: 3 days    Minutes of Exercise per Session: 30 min  Stress: Stress Concern Present (08/28/2019)   [employer] of Occupational Health - Occupational Stress Questionnaire     Feeling of Stress : Very much  Social Connections: Socially Isolated (03/28/2023)   Social Connection and Isolation Panel [NHANES]    Frequency of Communication with Friends and Family: Three times a week    Frequency of Social Gatherings with Friends and Family: Twice a week    Attends Religious Services: Never    Database administrator or Organizations: No    Attends Engineer, structural: Never    Marital Status: Divorced   Past Medical History:  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 06/15/2018   Cancer of skin of neck    Cataract    Chronic mesenteric ischemia ([name])    Chronic pain syndrome 07/09/2016   Crohn's disease of small and large intestines ([name])    followed by dr Baldo Ash gessner   Dairy product intolerance    Diarrhea, functional    Family history of adverse reaction to anesthesia    Grover's disease    transient acantholytic dermatosis   History of alcohol abuse    History of basal cell carcinoma excision    2013 left leg   History of Clostridium difficile    10/ 2014   History of multiple concussions    x6   last one Jan 2017 per pt--  no residual   History of substance abuse ([name])    quit 1997 per pt   History of suicide attempt    05-18-2012  overdose /  failure to thrive   Iron deficiency anemia due to chronic blood loss    Major depression, recurrent, chronic ([name])    Osteopenia    Personal history of adenomatous colonic polyps 12/2010, 03/2012   12/2010 - 8 mm serrated adenoma of rectum   Portal vein thrombosis 03/21/2015   right   Primary sclerosing cholangitis    ? 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   Substance abuse ([name]) 1997   Alcohol    Vitamin A deficiency 05/23/2018   Vitamin B6 deficiency 07/27/2018   Vitamin C deficiency 05/23/2018    Past Surgical History:  Procedure Laterality Date   ABDOMINAL AORTAGRAM N/A 03/29/2012   Procedure: ABDOMINAL Ronny Flurry;  Surgeon: Nada Libman, MD;  Location: [facility] CATH LAB;  Service: Cardiovascular;  Laterality: N/A;   ADENOIDECTOMY  age 89   BIOPSY  05/31/2018   Procedure: BIOPSY;  Surgeon: Rachael Fee, MD;  Location: [facility] ENDOSCOPY;  Service: Endoscopy;;   CATARACT EXTRACTION W/ INTRAOCULAR LENS  IMPLANT, BILATERAL  2009  COLONOSCOPY  2001, 05/02/2003, 01/28/11   2012: Right colon Crohn's, rectal polyp   COLONOSCOPY  03/31/2012   Procedure: COLONOSCOPY;  Surgeon: Beverley Fiedler, MD;  Location: [facility] ENDOSCOPY;  Service: Gastroenterology;  Laterality: N/A;   COLONOSCOPY WITH PROPOFOL N/A 05/31/2018   Procedure: COLONOSCOPY WITH PROPOFOL;  Surgeon: Rachael Fee, MD;  Location: [facility] ENDOSCOPY;  Service: Endoscopy;  Laterality: N/A;   ESOPHAGOGASTRODUODENOSCOPY  01/28/2011   Normal   FOOT SURGERY Right age 65   MOHS SURGERY Left 11/2013   left ankle parakerotosis    PERCUTANEOUS LIVER BIOPSY  2007 and 2008   PILONIDAL CYST EXCISION  age 45   PLACEMENT OF SETON N/A 11/06/2015   Procedure: PLACEMENT OF SETON;  Surgeon: Romie Levee, MD;  Location: [facility] [place];  Service: General;  Laterality: N/A;   PLACEMENT OF SETON  07/2018   at [facility]   RECTAL EXAM UNDER ANESTHESIA N/A 01/18/2019   Procedure: ANAL EXAM UNDER ANESTHESIA,  INCISION AND DRAINAGE, SETON PLACEMENT;  Surgeon: Romie Levee, MD;  Location: [facility];  Service: General;  Laterality: N/A;   UPPER GASTROINTESTINAL ENDOSCOPY      Current Medications: Current Facility-Administered Medications  Medication Dose Route Frequency Provider Last Rate Last Admin   acetaminophen (TYLENOL) tablet 650 mg  650 mg Oral Q6H PRN Motley-Mangrum, Jadeka A, PMHNP       alum & mag hydroxide-simeth  (MAALOX/MYLANTA) 200-200-20 MG/5ML suspension 30 mL  30 mL Oral Q4H PRN Motley-Mangrum, Jadeka A, PMHNP       ARIPiprazole (ABILIFY) tablet 5 mg  5 mg Oral QHS Verner Chol, MD   5 mg at 05/02/23 2200   doxycycline (VIBRA-TABS) tablet 100 mg  100 mg Oral Q12H Floydene Flock, MD   100 mg at 05/03/23 0981   feeding supplement (ENSURE ENLIVE / ENSURE PLUS) liquid 237 mL  1 Bottle Oral TID BM Verner Chol, MD   237 mL at 05/03/23 1409   hydrOXYzine (ATARAX) tablet 25 mg  25 mg Oral TID PRN Motley-Mangrum, Ezra Sites, PMHNP   25 mg at 05/03/23 1708   liver oil-zinc oxide (DESITIN) 40 % ointment   Topical Q12H PRN Verner Chol, MD   Given at 04/25/23 1733   LORazepam (ATIVAN) injection 1 mg  1 mg Intramuscular Q4H PRN Remington, Amber E, NP       LORazepam (ATIVAN) injection 2 mg  2 mg Intramuscular TID PRN Motley-Mangrum, Geralynn Ochs A, PMHNP       LORazepam (ATIVAN) tablet 1 mg  1 mg Oral Q4H PRN Remington, Amber E, NP   1 mg at 05/03/23 0916   magnesium hydroxide (MILK OF MAGNESIA) suspension 30 mL  30 mL Oral Daily PRN Motley-Mangrum, Jadeka A, PMHNP       multivitamin with minerals tablet 1 tablet  1 tablet Oral Daily Verner Chol, MD   1 tablet at 05/03/23 0916   ondansetron (ZOFRAN) injection 4 mg  4 mg Intravenous Q6H PRN Verner Chol, MD   4 mg at 04/16/23 0905   ondansetron (ZOFRAN-ODT) disintegrating tablet 4 mg  4 mg Oral Q8H PRN Verner Chol, MD       predniSONE (DELTASONE) tablet 30 mg  30 mg Oral Q breakfast Motley-Mangrum, Jadeka A, PMHNP   30 mg at 05/03/23 0916   venlafaxine XR (EFFEXOR-XR) 24 hr capsule 75 mg  75 mg Oral Q breakfast Verner Chol, MD   75 mg at 05/03/23 0916        Blood Alcohol  level:  Lab Results  Component Value Date   ETH <10 04/13/2023   ETH <10 03/28/2023    Metabolic Disorder Labs: Lab Results  Component Value Date   HGBA1C 4.8 04/03/2023   MPG 91.06 04/03/2023   No results found for: "PROLACTIN" Lab Results  Component Value Date    CHOL 209 (H) 04/03/2023   TRIG 75 04/03/2023   HDL 65 04/03/2023   CHOLHDL 3.2 04/03/2023   VLDL 15 04/03/2023   LDLCALC 129 (H) 04/03/2023    Psychiatric Specialty Exam:  Presentation  General Appearance:  Appropriate for Environment; Casual   Eye Contact: Fair   Speech: Clear and Coherent   Speech Volume: Normal       Mood and Affect  Mood: " better"   Affect: Less constricted     Thought Process  Thought Processes: Coherent   Descriptions of Associations:Intact   Orientation:Full (Time, Place and Person)   Thought Content:Logical   Hallucinations:Hallucinations: None   Ideas of Reference:None   Suicidal Thoughts:Suicidal Thoughts: No   Homicidal Thoughts:Homicidal Thoughts: No     Sensorium  Memory: Immediate Fair; Recent Fair; Remote Fair   Judgment: Impaired   Insight: Shallow     Executive Functions  Concentration: Fair   Attention Span: Fair   Recall: [employer] of Knowledge: Fair   Language: Fair     Psychomotor Activity  Psychomotor Activity: Psychomotor Activity: Normal   Musculoskeletal: Strength & Muscle Tone: decreased Gait & Station: Improved, patient walks with the help of a walker Assets  Assets: Manufacturing systems engineer; Desire for Improvement; Resilience   Physical Exam: Physical Exam Vitals and nursing note reviewed.  HENT:     Head: Normocephalic.     Nose: No congestion.     Mouth/Throat:     Mouth: Mucous membranes are moist.  Eyes:     Pupils: Pupils are equal, round, and reactive to light.  Cardiovascular:     Rate and Rhythm: Normal rate.     Pulses: Normal pulses.  Pulmonary:     Breath sounds: Normal breath sounds.  Abdominal:     General: Bowel sounds are normal.  Neurological:     General: No focal deficit present.     Mental Status: He is alert.    Review of Systems  Constitutional:  Positive for malaise/fatigue.  HENT: Negative.    Eyes: Negative.   Cardiovascular:  Negative.   Gastrointestinal: Negative.   Skin: Negative.   Blood pressure 92/61, pulse 79, temperature 98.2 F (36.8 C), resp. rate 14, height 6\' 1"  (1.854 m), weight 60.6 kg, SpO2 100%. Body mass index is 17.61 kg/m.  Diagnosis: Principal Problem:   MDD (major depressive disorder), recurrent severe, without psychosis ([name]) Active Problems:   GAD (generalized anxiety disorder)   Tremor  Clinical decision making-patient is currently admitted after a lethal suicide attempt by overdosing on her antidepressants.  Patient is noted to have disabling physical problems including Crohn's disease, chronic pain, physical weakness.  Patient continues to struggle with hopelessness, anhedonia, severe anxiety and panic attacks.  His suicidal ideation have improved to intermittent occurrence and denies any hallucinations.  We continue to adjust the medications to help with his treatment resistant depression.  He continues to need inpatient psychiatric hospitalization. PLAN: Safety and Monitoring:  -- Voluntary admission to inpatient psychiatric unit for safety, stabilization and treatment  -- Daily contact with patient to assess and evaluate symptoms and progress in treatment  -- Patient's case to be discussed in multi-disciplinary  team meeting  -- Observation Level : q15 minute checks  -- Vital signs:  q12 hours  -- Precautions: suicide, elopement, and assault -- Encouraged patient to participate in unit milieu and in scheduled group therapies  2. Psychiatric Diagnoses and Treatment:  Effexor Xr 75 mg daily  Discussed Abilify 5 mg at bedtime for mood stabilization given the intensity of depression.  Discussed thoroughly the side effects risk benefits of Abilify and patient gave consent for the medication.  Home medications were discontinued during this admission due to inefficacy-Lexapro, Buspar, Remeron                    -- Metabolic profile Z6X 4.8; LDL 096; T Cholesterol 209  EKG 04/26/2023 normal  sinus rhythm with QTc 423   3. Medical Issues Being Addressed:   serotonin syndrome-resolved Unstable gait- Improving Hematoma: hospitalist consulted, they recommended to watch it for now, to call back if it gets worse  4. Discharge Planning:   -- Social work and case management to assist with discharge planning and identification of hospital follow-up needs prior to discharge  -- Discharge date to be determined, Child psychotherapist and patient's family is working on home health care services  Lewanda Rife, MD

## 2023-05-03 NOTE — Group Note (Signed)
 Date:  05/03/2023 Time:  11:35 PM  Group Topic/Focus:  Wrap-Up Group:   The focus of this group is to help patients review their daily goal of treatment and discuss progress on daily workbooks.    Participation Level:  Did Not Attend  Participation Quality:      Affect:      Cognitive:      Insight: None  Engagement in Group:  None  Modes of Intervention:      Additional Comments:    Maeola Harman 05/03/2023, 11:35 PM

## 2023-05-03 NOTE — Progress Notes (Signed)
   05/03/23 0200  Psych Admission Type (Psych Patients Only)  Admission Status Involuntary  Psychosocial Assessment  Patient Complaints Depression  Eye Contact Fair  Facial Expression Flat  Affect Depressed  Speech Logical/coherent;Loud  Interaction Minimal  Motor Activity Slow  Appearance/Hygiene In scrubs  Behavior Characteristics Cooperative  Mood Euphoric  Thought Process  Coherency WDL  Content WDL  Delusions None reported or observed  Perception WDL  Hallucination None reported or observed  Judgment Limited  Confusion WDL  Danger to Self  Current suicidal ideation? Denies

## 2023-05-03 NOTE — Plan of Care (Signed)
   Problem: Education: Goal: Knowledge of [facility] Education information/materials will improve Outcome: Progressing Goal: Emotional status will improve Outcome: Progressing Goal: Mental status will improve Outcome: Progressing Goal: Verbalization of understanding the information provided will improve Outcome: Progressing

## 2023-05-03 NOTE — Plan of Care (Signed)
  Problem: Education: Goal: Knowledge of [facility] Education information/materials will improve 05/03/2023 1308 by Doneen Poisson, RN Outcome: Progressing 05/03/2023 1308 by Doneen Poisson, RN Outcome: Not Progressing Goal: Emotional status will improve 05/03/2023 1308 by Doneen Poisson, RN Outcome: Progressing 05/03/2023 1308 by Doneen Poisson, RN Outcome: Not Progressing Goal: Mental status will improve 05/03/2023 1308 by Doneen Poisson, RN Outcome: Progressing 05/03/2023 1308 by Doneen Poisson, RN Outcome: Not Progressing Goal: Verbalization of understanding the information provided will improve 05/03/2023 1308 by Doneen Poisson, RN Outcome: Progressing 05/03/2023 1308 by Doneen Poisson, RN Outcome: Not Progressing   Problem: Activity: Goal: Interest or engagement in activities will improve 05/03/2023 1308 by Doneen Poisson, RN Outcome: Progressing 05/03/2023 1308 by Doneen Poisson, RN Outcome: Not Progressing Goal: Sleeping patterns will improve 05/03/2023 1308 by Doneen Poisson, RN Outcome: Progressing 05/03/2023 1308 by Doneen Poisson, RN Outcome: Not Progressing   Problem: Coping: Goal: Ability to verbalize frustrations and anger appropriately will improve 05/03/2023 1308 by Doneen Poisson, RN Outcome: Progressing 05/03/2023 1308 by Doneen Poisson, RN Outcome: Not Progressing Goal: Ability to demonstrate self-control will improve 05/03/2023 1308 by Doneen Poisson, RN Outcome: Progressing 05/03/2023 1308 by Doneen Poisson, RN Outcome: Not Progressing   Problem: Health Behavior/Discharge Planning: Goal: Identification of resources available to assist in meeting health care needs will improve 05/03/2023 1308 by Doneen Poisson, RN Outcome: Progressing 05/03/2023 1308 by Doneen Poisson, RN Outcome: Not Progressing Goal: Compliance with treatment plan for underlying cause of condition will improve 05/03/2023 1308 by  Doneen Poisson, RN Outcome: Progressing 05/03/2023 1308 by Doneen Poisson, RN Outcome: Not Progressing   Problem: Physical Regulation: Goal: Ability to maintain clinical measurements within normal limits will improve 05/03/2023 1308 by Doneen Poisson, RN Outcome: Progressing 05/03/2023 1308 by Doneen Poisson, RN Outcome: Not Progressing   Problem: Safety: Goal: Periods of time without injury will increase 05/03/2023 1308 by Doneen Poisson, RN Outcome: Progressing 05/03/2023 1308 by Doneen Poisson, RN Outcome: Not Progressing

## 2023-05-03 NOTE — Group Note (Signed)
 Date:  05/03/2023 Time:  10:28 AM  Group Topic/Focus:  Goals Group:   The focus of this group is to help patients establish daily goals to achieve during treatment and discuss how the patient can incorporate goal setting into their daily lives to aide in recovery.    Participation Level:  Active  Participation Quality:  Appropriate  Affect:  Appropriate  Cognitive:  Appropriate  Insight: Appropriate  Engagement in Group:  Engaged  Modes of Intervention:  Discussion  Ardelle Anton 05/03/2023, 10:28 AM

## 2023-05-03 NOTE — Group Note (Signed)
 Recreation Therapy Group Note   Group Topic:Coping Skills  Group Date: 05/03/2023 Start Time: 1500 End Time: 1545 Facilitators: Rosina Lowenstein, LRT, CTRS Location: Courtyard  Group Description: Music Reminisce. LRT encouraged patients to think of their favorite song(s) that reminded them of a positive memory or time in their life. LRT encouraged patient to talk about that memory aloud to the group. LRT played the song through a speaker for all to hear. LRT and patients discussed how thinking of a positive memory or time in their life can be used as a coping skill in everyday life post discharge.   Goal Area(s) Addressed: Patient will increase verbal communication by conversing with peers. Patient will contribute to group discussion with minimal prompting. Patient will reminisce a positive memory or moment in their life.    Affect/Mood: N/A   Participation Level: Did not attend    Clinical Observations/Individualized Feedback: Patient did not attend group.   Plan: Continue to engage patient in RT group sessions 2-3x/week.   Rosina Lowenstein, LRT, CTRS 05/03/2023 5:19 PM

## 2023-05-03 NOTE — Group Note (Signed)
 Date:  05/03/2023 Time:  11:21 PM  Group Topic/Focus:  Wrap-Up Group:   The focus of this group is to help patients review their daily goal of treatment and discuss progress on daily workbooks.    Participation Level:  Active  Participation Quality:  Appropriate  Affect:  Appropriate  Cognitive:  Appropriate  Insight: Good  Engagement in Group:  Engaged  Modes of Intervention:  Discussion  Additional Comments:    Maeola Harman 05/03/2023, 11:21 PM

## 2023-05-03 NOTE — Group Note (Signed)
 Recreation Therapy Group Note   Group Topic:Health and Wellness  Group Date: 05/03/2023 Start Time: 1100 End Time: 1130 Facilitators: Rosina Lowenstein, LRT, CTRS Location:  Dayroom  Group Description: Seated Exercise. LRT discussed the mental and physical benefits of exercise. LRT and group discussed how physical activity can be used as a coping skill. Pt's and LRT followed along to an exercise video on the TV screen that provided a visual representation and audio description of every exercise performed. Pt's encouraged to listen to their bodies and stop at any time if they experience feelings of discomfort or pain. Pts were encouraged to drink water and stay hydrated.   Goal Area(s) Addressed: Patient will learn benefits of physical activity. Patient will identify exercise as a coping skill.  Patient will follow multistep directions. Patient will try a new leisure interest.    Affect/Mood: N/A   Participation Level: Did not attend    Clinical Observations/Individualized Feedback: Patient did not attend group.   Plan: Continue to engage patient in RT group sessions 2-3x/week.   Rosina Lowenstein, LRT, CTRS 05/03/2023 12:33 PM

## 2023-05-04 DIAGNOSIS — F332 Major depressive disorder, recurrent severe without psychotic features: Secondary | ICD-10-CM | POA: Diagnosis not present

## 2023-05-04 MED ORDER — VENLAFAXINE HCL ER 75 MG PO CP24
150.0000 mg | ORAL_CAPSULE | Freq: Every day | ORAL | Status: DC
  Administered 2023-05-05 – 2023-05-20 (×16): 150 mg via ORAL
  Filled 2023-05-04 (×16): qty 2

## 2023-05-04 MED ORDER — TRIPLE ANTIBIOTIC 3.5-400-5000 EX OINT
TOPICAL_OINTMENT | Freq: Four times a day (QID) | CUTANEOUS | Status: DC | PRN
  Administered 2023-05-04 – 2023-05-20 (×15): 1 via TOPICAL
  Filled 2023-05-04 (×21): qty 1

## 2023-05-04 NOTE — [facility] Counselor (Signed)
 CSW contacted pt's primary care [phone]) to see about a referral for home health.   Britta Mccreedy, Dr.Ross's nurse reports she will ask Dr. Tenny Craw but that she thinks she will be unable to get the order.   Britta Mccreedy reports that pt no showed and cancelled his last two appointments so they will not be able to do the order.   Reynaldo Minium, [name], [place] 05/05/2023 3:08 PM

## 2023-05-04 NOTE — Progress Notes (Signed)
 Occupational Therapy Treatment Patient Details Name: CALOB BASKETTE MRN: [id number] DOB: 05/14/1965 Today's Date: 05/04/2023   History of present illness Wade Asebedo is a 58 year old Caucasian male with a history of MDD, anxiety, Crohn's disease, and chronic mesenteric ischemia who presented to ED on 04/13/23 at 0404 for suicide attempt by ingestion of 28 tablets of 10mg  Lexapro at 0200. Patient sent to behavior Med unit.   OT comments  Upon entering the room, pt found in bathroom and demonstrates toileting tasks at mod I level with use of RW for safety. Pt requesting to ambulate in hallway 200' with RW with supervision and then returns to room. Pt given SLUMS this session.   Pt completed SLUMS examination this date scoring 17/30 indicating a positive screen for dementia. Of note, it is not within occupational therapy scope of practice to diagnose cognitive impairments, this screen indicates need for further testing. The SLUMS is a 30 point, 11 question screening questionnaire that tests orientation, memory, attention, and executive function. Pt with noted impairments in short term memory, problem solving, and executive function limiting ability to accurately perform medication management and home management tasks safely.   Pt communicates well during session and hopes for placement at facility after hospital discharge. He does endorse living with parents and performing caregiver role feels too overwelming at this time.       If plan is discharge home, recommend the following:  A little help with bathing/dressing/bathroom;Assistance with cooking/housework;Assist for transportation;Direct supervision/assist for medications management;Direct supervision/assist for financial management;Supervision due to cognitive status;Help with stairs or ramp for entrance   Equipment Recommendations  Other (comment) (RW)       Precautions / Restrictions Precautions Precautions:  Fall Precaution/Restrictions Comments: intermittent dizziness       Mobility Bed Mobility Overal bed mobility: Modified Independent                  Transfers Overall transfer level: Modified independent Equipment used: Rolling walker (2 wheels) Transfers: Sit to/from Stand Sit to Stand: Modified independent (Device/Increase time) Stand pivot transfers: Supervision, Contact guard assist               Balance Overall balance assessment: Mild deficits observed, not formally tested         Standing balance support: Bilateral upper extremity supported Standing balance-Leahy Scale: Fair                             ADL either performed or assessed with clinical judgement   ADL Overall ADL's : Needs assistance/impaired                         Toilet Transfer: Modified Independent;Rolling walker (2 wheels)   Toileting- Clothing Manipulation and Hygiene: Modified independent;Sit to/from stand              Extremity/Trunk Assessment              Vision Patient Visual Report: No change from baseline               Cognition Arousal: Alert Behavior During Therapy: Flat affect Cognition: No family/caregiver present to determine baseline  Pertinent Vitals/ Pain       Pain Assessment Pain Assessment: No/denies pain         Frequency  Min 1X/week        Progress Toward Goals  OT Goals(current goals can now be found in the care plan section)  Progress towards OT goals: Progressing toward goals      AM-PAC OT "6 Clicks" Daily Activity     Outcome Measure   Help from another person eating meals?: None Help from another person taking care of personal grooming?: A Little Help from another person toileting, which includes using toliet, bedpan, or urinal?: A Little Help from another person bathing (including washing, rinsing, drying)?: A  Little Help from another person to put on and taking off regular upper body clothing?: None Help from another person to put on and taking off regular lower body clothing?: A Little 6 Click Score: 20    End of Session Equipment Utilized During Treatment: Rolling walker (2 wheels)  OT Visit Diagnosis: Other abnormalities of gait and mobility (R26.89);Muscle weakness (generalized) (M62.81)   Activity Tolerance Patient tolerated treatment well   Patient Left in bed;with call bell/phone within reach   Nurse Communication          Time: 6045-4098 OT Time Calculation (min): 31 min  Charges: OT General Charges $OT Visit: 1 Visit OT Treatments $Self Care/Home Management : 8-22 mins $Therapeutic Activity: 8-22 mins  Jackquline Denmark, MS, OTR/L , CBIS ascom [phone]  05/04/23, 3:37 PM

## 2023-05-04 NOTE — Progress Notes (Signed)
 PT Cancellation Note  Patient Details Name: [name] MRN: [id number] DOB: 1965-03-30   Cancelled Treatment:     PT attempt. Pt politely requested Thereasa Parkin return later this date. " Can you come back after lunch?" Author will return later this date as requested.    Rushie Chestnut 05/04/2023, 9:30 AM

## 2023-05-04 NOTE — Progress Notes (Signed)
 [facility] MD Progress Note  05/04/2023  ADELFO DIEBEL  MRN:  [id number]  [name] is a 58 year old Caucasian male with a history of MDD, anxiety, Crohn's disease, and chronic mesenteric ischemia who presented to [facility] emergency department on 04/13/23 at 0404 for suicide attempt by ingestion of 28 tablets of 10mg  Lexapro at 0200. Per ED documentation, patient had some mild symptoms of serotonin syndrome on arrival, including acute agitation and clonus, with symptomatic improvement with benzodiazepines. Did have a return of symptoms requiring additional dose of lorazepam. Poison control advised the total amount of ingested Lexapro likely is not enough to cause serotonin syndrome by itself and they recommended 8-hour cardiac monitoring. Qtc 498.  Subjective:  Chart reviewed, case discussed in multidisciplinary meeting, patient seen during rounds.  Staff reports patient is doing better on the unit. Today during assessment patient reports "fine" mood.  No new events reported overnight.  Patient reports his sleep and appetite has improved. He denies auditory/visual hallucinations.  He is taking his medications with no reported side effects.  He denies any intention or plan to harm himself or others.  Appetite:  Fair  Past Psychiatric History: see h&P Family History:  Family History  Problem Relation Age of Onset   Heart disease Father    Thyroid cancer Father    Allergies Father    Clotting disorder Father    Breast cancer Mother    Stomach cancer Mother    Colon cancer Neg Hx    Esophageal cancer Neg Hx    Rectal cancer Neg Hx    Social History:  Social History   Substance and Sexual Activity  Alcohol Use No   Alcohol/week: 0.0 standard drinks of alcohol     Social History   Substance and Sexual Activity  Drug Use Not Currently   Types: Cocaine, Marijuana   Comment: QUIT USING DRUGS IN 1997    Social History   Socioeconomic History   Marital status: Single    Spouse  name: Not on file   Number of children: 1   Years of education: Not on file   Highest education level: Not on file  Occupational History   Occupation: Disability   Tobacco Use   Smoking status: Former    Current packs/day: 0.00    Average packs/day: 1 pack/day for 15.0 years (15.0 ttl pk-yrs)    Types: Cigarettes    Start date: 03/28/1982    Quit date: 03/28/1997    Years since quitting: 26.1   Smokeless tobacco: Never  Vaping Use   Vaping status: Never Used  Substance and Sexual Activity   Alcohol use: No    Alcohol/week: 0.0 standard drinks of alcohol   Drug use: Not Currently    Types: Cocaine, Marijuana    Comment: QUIT USING DRUGS IN 1997   Sexual activity: Not Currently  Other Topics Concern   Not on file  Social History Narrative   Single, history of substance abuse in recovery   Previously occupied on medical disability   1 child  Daughter Wellsite geologist, lives and works in [place]   Elderly parents involved and he helps them   1 brother   Social Drivers of Corporate investment banker Strain: Low Risk  (08/28/2019)   Overall Financial Resource Strain (CARDIA)    Difficulty of Paying Living Expenses: Not hard at all  Food Insecurity: Food Insecurity Present (04/13/2023)   Hunger Vital Sign    Worried About Running Out of Food in the  Last Year: Never true    Ran Out of Food in the Last Year: Sometimes true  Transportation Needs: No Transportation Needs (04/13/2023)   PRAPARE - Administrator, Civil Service (Medical): No    Lack of Transportation (Non-Medical): No  Physical Activity: Insufficiently Active (11/08/2019)   Exercise Vital Sign    Days of Exercise per Week: 3 days    Minutes of Exercise per Session: 30 min  Stress: Stress Concern Present (08/28/2019)   [employer] of Occupational Health - Occupational Stress Questionnaire    Feeling of Stress : Very much  Social Connections: Socially Isolated (03/28/2023)   Social Connection and Isolation  Panel [NHANES]    Frequency of Communication with Friends and Family: Three times a week    Frequency of Social Gatherings with Friends and Family: Twice a week    Attends Religious Services: Never    Database administrator or Organizations: No    Attends Engineer, structural: Never    Marital Status: Divorced   Past Medical History:  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 06/15/2018   Cancer of skin of neck    Cataract    Chronic mesenteric ischemia ([name])    Chronic pain syndrome 07/09/2016   Crohn's disease of small and large intestines ([name])    followed by dr Baldo Ash gessner   Dairy product intolerance    Diarrhea, functional    Family history of adverse reaction to anesthesia    Grover's disease    transient acantholytic dermatosis   History of alcohol abuse    History of basal cell carcinoma excision    2013 left leg   History of Clostridium difficile    10/ 2014   History of multiple concussions    x6   last one Jan 2017 per pt--  no residual   History of substance abuse ([name])    quit 1997 per pt   History of suicide attempt    05-18-2012  overdose /  failure to thrive   Iron deficiency anemia due to chronic blood loss    Major depression, recurrent, chronic ([name])    Osteopenia    Personal history of adenomatous colonic polyps 12/2010, 03/2012   12/2010 - 8 mm serrated adenoma of rectum   Portal vein thrombosis 03/21/2015   right   Primary sclerosing cholangitis    ? 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   Substance abuse ([name]) 1997   Alcohol   Vitamin A deficiency 05/23/2018   Vitamin B6 deficiency 07/27/2018   Vitamin C deficiency 05/23/2018    Past  Surgical History:  Procedure Laterality Date   ABDOMINAL AORTAGRAM N/A 03/29/2012   Procedure: ABDOMINAL Ronny Flurry;  Surgeon: Nada Libman, MD;  Location: [facility] CATH LAB;  Service: Cardiovascular;  Laterality: N/A;   ADENOIDECTOMY  age 27   BIOPSY  05/31/2018   Procedure: BIOPSY;  Surgeon: Rachael Fee, MD;  Location: [facility] ENDOSCOPY;  Service: Endoscopy;;   CATARACT EXTRACTION W/ INTRAOCULAR LENS  IMPLANT, BILATERAL  2009   COLONOSCOPY  2001, 05/02/2003, 01/28/11   2012: Right colon Crohn's, rectal polyp  COLONOSCOPY  03/31/2012   Procedure: COLONOSCOPY;  Surgeon: Beverley Fiedler, MD;  Location: [facility] ENDOSCOPY;  Service: Gastroenterology;  Laterality: N/A;   COLONOSCOPY WITH PROPOFOL N/A 05/31/2018   Procedure: COLONOSCOPY WITH PROPOFOL;  Surgeon: Rachael Fee, MD;  Location: [facility] ENDOSCOPY;  Service: Endoscopy;  Laterality: N/A;   ESOPHAGOGASTRODUODENOSCOPY  01/28/2011   Normal   FOOT SURGERY Right age 59   MOHS SURGERY Left 11/2013   left ankle parakerotosis    PERCUTANEOUS LIVER BIOPSY  2007 and 2008   PILONIDAL CYST EXCISION  age 1   PLACEMENT OF SETON N/A 11/06/2015   Procedure: PLACEMENT OF SETON;  Surgeon: Romie Levee, MD;  Location: [facility];  Service: General;  Laterality: N/A;   PLACEMENT OF SETON  07/2018   at [facility]   RECTAL EXAM UNDER ANESTHESIA N/A 01/18/2019   Procedure: ANAL EXAM UNDER ANESTHESIA,  INCISION AND DRAINAGE, SETON PLACEMENT;  Surgeon: Romie Levee, MD;  Location:  SURGERY CENTER;  Service: General;  Laterality: N/A;   UPPER GASTROINTESTINAL ENDOSCOPY      Current Medications: Current Facility-Administered Medications  Medication Dose Route Frequency Provider Last Rate Last Admin   acetaminophen (TYLENOL) tablet 650 mg  650 mg Oral Q6H PRN Motley-Mangrum, Jadeka A, PMHNP       alum & mag hydroxide-simeth (MAALOX/MYLANTA) 200-200-20 MG/5ML suspension 30 mL  30 mL Oral Q4H PRN Motley-Mangrum, Jadeka A, PMHNP        ARIPiprazole (ABILIFY) tablet 5 mg  5 mg Oral QHS Verner Chol, MD   5 mg at 05/03/23 2121   doxycycline (VIBRA-TABS) tablet 100 mg  100 mg Oral Q12H Floydene Flock, MD   100 mg at 05/04/23 0802   feeding supplement (ENSURE ENLIVE / ENSURE PLUS) liquid 237 mL  1 Bottle Oral TID BM Verner Chol, MD   237 mL at 05/04/23 1412   hydrOXYzine (ATARAX) tablet 25 mg  25 mg Oral TID PRN Motley-Mangrum, Geralynn Ochs A, PMHNP   25 mg at 05/04/23 1420   liver oil-zinc oxide (DESITIN) 40 % ointment   Topical Q12H PRN Verner Chol, MD   Given at 05/04/23 0803   LORazepam (ATIVAN) injection 1 mg  1 mg Intramuscular Q4H PRN Remington, Amber E, NP       LORazepam (ATIVAN) injection 2 mg  2 mg Intramuscular TID PRN Motley-Mangrum, Geralynn Ochs A, PMHNP       LORazepam (ATIVAN) tablet 1 mg  1 mg Oral Q4H PRN Melanie Crazier, Amber E, NP   1 mg at 05/04/23 0802   magnesium hydroxide (MILK OF MAGNESIA) suspension 30 mL  30 mL Oral Daily PRN Motley-Mangrum, Jadeka A, PMHNP       multivitamin with minerals tablet 1 tablet  1 tablet Oral Daily Verner Chol, MD   1 tablet at 05/04/23 0802   neomycin-bacitracin-polymyxin [id number] OINT   Topical Q6H PRN Lewanda Rife, MD   1 Application at 05/04/23 1422   ondansetron (ZOFRAN) injection 4 mg  4 mg Intravenous Q6H PRN Verner Chol, MD   4 mg at 04/16/23 0905   ondansetron (ZOFRAN-ODT) disintegrating tablet 4 mg  4 mg Oral Q8H PRN Verner Chol, MD       predniSONE (DELTASONE) tablet 30 mg  30 mg Oral Q breakfast Motley-Mangrum, Jadeka A, PMHNP   30 mg at 05/04/23 0802   venlafaxine XR (EFFEXOR-XR) 24 hr capsule 75 mg  75 mg Oral Q breakfast Verner Chol, MD   75 mg at 05/04/23 [phone]  Blood Alcohol level:  Lab Results  Component Value Date   ETH <10 04/13/2023   ETH <10 03/28/2023    Metabolic Disorder Labs: Lab Results  Component Value Date   HGBA1C 4.8 04/03/2023   MPG 91.06 04/03/2023   No results found for: "PROLACTIN" Lab Results   Component Value Date   CHOL 209 (H) 04/03/2023   TRIG 75 04/03/2023   HDL 65 04/03/2023   CHOLHDL 3.2 04/03/2023   VLDL 15 04/03/2023   LDLCALC 129 (H) 04/03/2023    Psychiatric Specialty Exam:  Presentation  General Appearance:  Appropriate for Environment; Casual   Eye Contact: Fair   Speech: Clear and Coherent   Speech Volume: Normal       Mood and Affect  Mood: " better"   Affect: Less constricted     Thought Process  Thought Processes: Coherent   Descriptions of Associations:Intact   Orientation:Full (Time, Place and Person)   Thought Content:Logical   Hallucinations:Hallucinations: None   Ideas of Reference:None   Suicidal Thoughts:Suicidal Thoughts: No   Homicidal Thoughts:Homicidal Thoughts: No     Sensorium  Memory: Immediate Fair; Recent Fair; Remote Fair   Judgment: Impaired   Insight: Shallow     Executive Functions  Concentration: Fair   Attention Span: Fair   Recall: [employer] of Knowledge: Fair   Language: Fair     Psychomotor Activity  Psychomotor Activity: Psychomotor Activity: Normal   Musculoskeletal: Strength & Muscle Tone: decreased Gait & Station: Improved, patient walks with the help of a walker Assets  Assets: Manufacturing systems engineer; Desire for Improvement; Resilience   Physical Exam: Physical Exam Vitals and nursing note reviewed.  HENT:     Head: Normocephalic.     Nose: No congestion.     Mouth/Throat:     Mouth: Mucous membranes are moist.  Eyes:     Pupils: Pupils are equal, round, and reactive to light.  Cardiovascular:     Rate and Rhythm: Normal rate.     Pulses: Normal pulses.  Pulmonary:     Breath sounds: Normal breath sounds.  Abdominal:     General: Bowel sounds are normal.  Neurological:     General: No focal deficit present.     Mental Status: He is alert.    Review of Systems  Constitutional:  Positive for malaise/fatigue.  HENT: Negative.    Eyes: Negative.    Cardiovascular: Negative.   Gastrointestinal: Negative.   Skin: Negative.   Blood pressure 106/74, pulse 80, temperature 98.2 F (36.8 C), resp. rate 14, height 6\' 1"  (1.854 m), weight 60.6 kg, SpO2 97%. Body mass index is 17.61 kg/m.  Diagnosis: Principal Problem:   MDD (major depressive disorder), recurrent severe, without psychosis ([name]) Active Problems:   GAD (generalized anxiety disorder)   Tremor  Clinical decision making-patient is currently admitted after a lethal suicide attempt by overdosing on her antidepressants.  Patient is noted to have disabling physical problems including Crohn's disease, chronic pain, physical weakness.  Patient continues to struggle with hopelessness, anhedonia, severe anxiety and panic attacks.  His suicidal ideation have improved to intermittent occurrence and denies any hallucinations.  We continue to adjust the medications to help with his treatment resistant depression.  He continues to need inpatient psychiatric hospitalization. PLAN: Safety and Monitoring:  -- Voluntary admission to inpatient psychiatric unit for safety, stabilization and treatment  -- Daily contact with patient to assess and evaluate symptoms and progress in treatment  -- Patient's case to be discussed  in multi-disciplinary team meeting  -- Observation Level : q15 minute checks  -- Vital signs:  q12 hours  -- Precautions: suicide, elopement, and assault -- Encouraged patient to participate in unit milieu and in scheduled group therapies  2. Psychiatric Diagnoses and Treatment:  Increase the dose of Effexor Xr to 150 mg daily, 05/05/23  Continue Abilify 5 mg at bedtime for mood stabilization given the intensity of depression.  Discussed thoroughly the side effects risk benefits of Abilify and patient gave consent for the medication.    3. Medical Issues Being Addressed:   serotonin syndrome-resolved Unstable gait- Improving Hematoma: hospitalist consulted, they recommended to  watch it for now, to call back if it gets worse  4. Discharge Planning:   -- Social work and case management to assist with discharge planning and identification of hospital follow-up needs prior to discharge  -- Discharge date to be determined, Child psychotherapist and patient's family is working on home health care services  Lewanda Rife, MD

## 2023-05-04 NOTE — Progress Notes (Signed)
 Physical Therapy Treatment Patient Details Name: [name] MRN: [id number] DOB: Jun 14, 1965 Today's Date: 05/04/2023   History of Present Illness [name] is a 57 year old Caucasian male with a history of MDD, anxiety, Crohn's disease, and chronic mesenteric ischemia who presented to ED on 04/13/23 at 0404 for suicide attempt by ingestion of 28 tablets of 10mg  Lexapro at 0200. Patient sent to behavior Med unit. (Simultaneous filing. User may not have seen previous data.)    PT Comments  Pt/RN in room upon arrival. He was ambulating to BR to apply cream on irritated skin. Pt was agreeable and much more talkative today versus previously observed in past sessions. Pt till endorses symptoms of depression but also endorses feeling like he is doing much better from a physical/mobility standpoint. Pt performed ambulation in hallways with focus on dynamic balance exercises to promoted improved balance. Pt does still require use of RW during all dynamic standing activity for safety. Acute PT will continue to follow and progress per current POC.          Equipment Recommendations  Rolling walker (2 wheels)       Precautions / Restrictions Precautions Precautions: Fall (Simultaneous filing. User may not have seen previous data.)     Mobility  Bed Mobility Overal bed mobility: Modified Independent     Transfers Overall transfer level: Modified independent Equipment used: Rolling walker (2 wheels) Transfers: Sit to/from Stand Sit to Stand: Modified independent (Device/Increase time)   Ambulation/Gait Ambulation/Gait assistance: Modified independent (Device/Increase time), Supervision Gait Distance (Feet): 400 Feet Assistive device: Rolling walker (2 wheels) Gait Pattern/deviations: Decreased step length - right, Decreased step length - left, Decreased stride length, Decreased dorsiflexion - right, Decreased dorsiflexion - left, Trunk flexed Gait velocity: decreased  General  Gait Details: Pt was able to ambulate ~ 400 ft with RW. Still with slight RLE weaness but pt continues to endorse this is due to lawn mower accident from when he was younger (58 years old)   Balance Overall balance assessment: Needs assistance Sitting-balance support: Feet supported Sitting balance-Leahy Scale: Normal     Standing balance support: Bilateral upper extremity supported, During functional activity, Reliant on assistive device for balance Standing balance-Leahy Scale: Good Standing balance comment: several times pt would let go of RW with 1 hand while walking without LOB or safety concern.       Communication Communication Communication: No apparent difficulties  Cognition Arousal: Alert Behavior During Therapy: Flat affect   PT - Cognitive impairments: No apparent impairments   PT - Cognition Comments: enaged and overall less anxious today with no tremors noted Following commands: Intact            General Comments General comments (skin integrity, edema, etc.): most of session focused on improving dynamic balance. Pt was able to perform with CGA for safety and min assist with higher level balance exercises. Author had pt perform head turns, SLS exercises, and eyes closed. He remains a fall risk but this was improved from previously observed several weeks prior.      Pertinent Vitals/Pain Pain Assessment Pain Assessment: No/denies pain (Simultaneous filing. User may not have seen previous data.) Pain Score: 0-No pain Pain Location: sacral/butt pain Pain Descriptors / Indicators: Sore     PT Goals (current goals can now be found in the care plan section) Acute Rehab PT Goals Patient Stated Goal: "Tolerate more activity without getting tired." Progress towards PT goals: Progressing toward goals    Frequency    Min 1X/week  AM-PAC PT "6 Clicks" Mobility   Outcome Measure  Help needed turning from your back to your side while in a flat bed without  using bedrails?: None Help needed moving from lying on your back to sitting on the side of a flat bed without using bedrails?: None Help needed moving to and from a bed to a chair (including a wheelchair)?: None Help needed standing up from a chair using your arms (e.g., wheelchair or bedside chair)?: None Help needed to walk in hospital room?: None Help needed climbing 3-5 steps with a railing? : A Little 6 Click Score: 23    End of Session   Activity Tolerance: Patient tolerated treatment well Patient left: in bed Nurse Communication: Mobility status PT Visit Diagnosis: Muscle weakness (generalized) (M62.81);Difficulty in walking, not elsewhere classified (R26.2)     Time: 7628-3151 PT Time Calculation (min) (ACUTE ONLY): 10 min  Charges:    $Neuromuscular Re-education: 8-22 mins PT General Charges $$ ACUTE PT VISIT: 1 Visit                    Jetta Lout PTA 05/04/23, 2:58 PM

## 2023-05-04 NOTE — Progress Notes (Signed)
   05/04/23 0500  Psych Admission Type (Psych Patients Only)  Admission Status Involuntary  Psychosocial Assessment  Patient Complaints None  Eye Contact Fair  Facial Expression Flat  Affect Appropriate to circumstance  Speech Logical/coherent  Interaction Minimal  Motor Activity Slow  Appearance/Hygiene In scrubs  Behavior Characteristics Cooperative  Mood Euphoric  Thought Process  Coherency WDL  Content WDL  Delusions None reported or observed  Perception WDL  Hallucination None reported or observed  Judgment Limited  Confusion WDL  Danger to Self  Current suicidal ideation? Denies  Danger to Others  Danger to Others None reported or observed

## 2023-05-04 NOTE — Plan of Care (Signed)
 Pt denies SI/HI/AVH and pain, but c/o anxiety and depression. Pt is pleasant and cooperative. Pt was offered support and encouragement. Pt was given scheduled medications and PRN meds requested. Pt was encourage to attend groups. Q 15 minute safety checks maintained/ongoing.Pt receptive to treatment and safety maintained on unit.  Problem: Education: Goal: Knowledge of [facility] Education information/materials will improve Outcome: Progressing Goal: Emotional status will improve Outcome: Progressing Goal: Mental status will improve Outcome: Progressing Goal: Verbalization of understanding the information provided will improve Outcome: Progressing   Problem: Activity: Goal: Interest or engagement in activities will improve Outcome: Progressing Goal: Sleeping patterns will improve Outcome: Progressing   Problem: Coping: Goal: Ability to verbalize frustrations and anger appropriately will improve Outcome: Progressing Goal: Ability to demonstrate self-control will improve Outcome: Progressing   Problem: Health Behavior/Discharge Planning: Goal: Identification of resources available to assist in meeting health care needs will improve Outcome: Progressing Goal: Compliance with treatment plan for underlying cause of condition will improve Outcome: Progressing   Problem: Physical Regulation: Goal: Ability to maintain clinical measurements within normal limits will improve Outcome: Progressing   Problem: Safety: Goal: Periods of time without injury will increase Outcome: Progressing

## 2023-05-04 NOTE — Plan of Care (Signed)
 D: Pt alert and oriented. Pt reports experiencing anxiety/depression at this time. Pt denies experiencing any pain at this time. Pt denies experiencing any SI/HI, or AVH at this time.   A: Scheduled medications administered to pt, per MD orders. Support and encouragement provided. Frequent verbal contact made. Routine safety checks conducted q15 minutes.   R: No adverse drug reactions noted. Pt verbally contracts for safety at this time. Pt compliant with medications and treatment plan. Pt interacts well but minimally with others on the unit. Pt remains safe at this time. Plan of care ongoing.  Pt observed coming out of his room and utilizing his walker not only to meals but also just to walk as an exercise. Pt also worked with PT/OT today.   Problem: Education: Goal: Emotional status will improve Outcome: Not Progressing   Problem: Activity: Goal: Interest or engagement in activities will improve Outcome: Progressing

## 2023-05-04 NOTE — Progress Notes (Addendum)
 Physical Therapy Group Note  Group Topic:  Exercise/Activity Goals and Guidelines Group Date: 05/04/2023 Group Time (start and end): 13:00-13:36 Facilitators:  D. Elly Modena PT, DPT  Group Description: Group reviewed definition of physical activity and benefits of a consistent exercise program on both physical and mental health.  Defined various types of physical activity (aerobic exercise, resistance exercise, flexibility training, balance exercise) and discussed specific guidelines/recommendations for each type of activity.  Educated participants in various ways to monitor/progress activity tolerance including use of RPE scale. Encouraged each participant to identify ways to incorporate regular physical activity into his/her daily routine, and provided handout with various community resources to support ongoing physical activity upon transition back to home and community.   Therapeutic Goal(s): Identify immediate- and long-term effects of physical activity on individual health. Identify various types of physical activity and activities included with each type. Identify and demonstrate ability to monitor individual response to exercise. Identify a goal/interest for incorporating physical activity into daily routine, both in the unit and upon return home.   Individual Participation: Followed along while going through handouts and participated with the functional component of the session that included ambulating in the unit with a RW.   Participation Level and Quality:  Quietly engaged  Behavior: Appropriate    Speech/Thought Process:  Appropriate  Affect/Mood: Flat affect, reserved   Insight: Appropriate   Judgement:  Appropriate   Modes of Intervention: Verbal discussion, demonstration, handout provided     Plan: Continue to engage patient in PT/OT groups 1-2x/week.   Ovidio Hanger PT, DPT 05/04/23, 1:56 PM

## 2023-05-04 NOTE — Group Note (Signed)
 Date:  05/04/2023 Time:  8:47 PM  Group Topic/Focus:  Wrap-Up Group:   The focus of this group is to help patients review their daily goal of treatment and discuss progress on daily workbooks.    Participation Level:  Did Not Attend   Lenore Cordia 05/04/2023, 8:47 PM

## 2023-05-05 DIAGNOSIS — F332 Major depressive disorder, recurrent severe without psychotic features: Secondary | ICD-10-CM | POA: Diagnosis not present

## 2023-05-05 NOTE — Progress Notes (Signed)
   05/04/23 2100  Psych Admission Type (Psych Patients Only)  Admission Status Involuntary  Psychosocial Assessment  Patient Complaints Anxiety;Depression  Eye Contact Fair  Facial Expression Flat  Affect Appropriate to circumstance  Speech Logical/coherent  Interaction Minimal  Motor Activity Slow  Appearance/Hygiene In scrubs  Behavior Characteristics Cooperative  Mood Depressed;Anxious  Aggressive Behavior  Effect No apparent injury  Thought Process  Coherency WDL  Content WDL  Delusions None reported or observed  Perception WDL  Hallucination None reported or observed  Judgment Limited  Confusion WDL  Danger to Self  Current suicidal ideation? Denies  Self-Injurious Behavior No self-injurious ideation or behavior indicators observed or expressed   Agreement Not to Harm Self Yes  Description of Agreement Verbal  Danger to Others  Danger to Others None reported or observed

## 2023-05-05 NOTE — Progress Notes (Signed)
 [facility] MD Progress Note  05/05/2023  [name]  MRN:  [id number]  [name] is a 58 year old Caucasian male with a history of MDD, anxiety, Crohn's disease, and chronic mesenteric ischemia who presented to [facility] emergency department on 04/13/23 at 0404 for suicide attempt by ingestion of 28 tablets of 10mg  Lexapro at 0200. Per ED documentation, patient had some mild symptoms of serotonin syndrome on arrival, including acute agitation and clonus, with symptomatic improvement with benzodiazepines. Did have a return of symptoms requiring additional dose of lorazepam. Poison control advised the total amount of ingested Lexapro likely is not enough to cause serotonin syndrome by itself and they recommended 8-hour cardiac monitoring. Qtc 498.  Subjective:  Chart reviewed, case discussed in multidisciplinary meeting, patient seen during rounds.  Staff reports patient is doing better on the unit. Today during assessment patient reports "better" mood. Reports sometimes he feels anxious about his placement.  Patient was provided with support and reassurance.  Patient was informed that social worker is working with patient family on finding a place for the patient.  Patient reports his sleep and appetite has improved. He denies auditory/visual hallucinations.He denies any intention or plan to harm himself or others.  I spoke with patient's mother via phone today.  Mother's concerns were addressed.  Update on patient's progress was provided to mother.   Appetite:  Fair  Past Psychiatric History: see h&P Family History:  Family History  Problem Relation Age of Onset   Heart disease Father    Thyroid cancer Father    Allergies Father    Clotting disorder Father    Breast cancer Mother    Stomach cancer Mother    Colon cancer Neg Hx    Esophageal cancer Neg Hx    Rectal cancer Neg Hx    Social History:  Social History   Substance and Sexual Activity  Alcohol Use No   Alcohol/week:  0.0 standard drinks of alcohol     Social History   Substance and Sexual Activity  Drug Use Not Currently   Types: Cocaine, Marijuana   Comment: QUIT USING DRUGS IN 1997    Social History   Socioeconomic History   Marital status: Single    Spouse name: Not on file   Number of children: 1   Years of education: Not on file   Highest education level: Not on file  Occupational History   Occupation: Disability   Tobacco Use   Smoking status: Former    Current packs/day: 0.00    Average packs/day: 1 pack/day for 15.0 years (15.0 ttl pk-yrs)    Types: Cigarettes    Start date: 03/28/1982    Quit date: 03/28/1997    Years since quitting: 26.1   Smokeless tobacco: Never  Vaping Use   Vaping status: Never Used  Substance and Sexual Activity   Alcohol use: No    Alcohol/week: 0.0 standard drinks of alcohol   Drug use: Not Currently    Types: Cocaine, Marijuana    Comment: QUIT USING DRUGS IN 1997   Sexual activity: Not Currently  Other Topics Concern   Not on file  Social History Narrative   Single, history of substance abuse in recovery   Previously occupied on medical disability   1 child  Daughter Wellsite geologist, lives and works in [place]   Elderly parents involved and he helps them   1 brother   Social Drivers of Corporate investment banker Strain: Low Risk  (08/28/2019)  Overall Financial Resource Strain (CARDIA)    Difficulty of Paying Living Expenses: Not hard at all  Food Insecurity: Food Insecurity Present (04/13/2023)   Hunger Vital Sign    Worried About Running Out of Food in the Last Year: Never true    Ran Out of Food in the Last Year: Sometimes true  Transportation Needs: No Transportation Needs (04/13/2023)   PRAPARE - Administrator, Civil Service (Medical): No    Lack of Transportation (Non-Medical): No  Physical Activity: Insufficiently Active (11/08/2019)   Exercise Vital Sign    Days of Exercise per Week: 3 days    Minutes of Exercise per  Session: 30 min  Stress: Stress Concern Present (08/28/2019)   [employer] of Occupational Health - Occupational Stress Questionnaire    Feeling of Stress : Very much  Social Connections: Socially Isolated (03/28/2023)   Social Connection and Isolation Panel [NHANES]    Frequency of Communication with Friends and Family: Three times a week    Frequency of Social Gatherings with Friends and Family: Twice a week    Attends Religious Services: Never    Database administrator or Organizations: No    Attends Engineer, structural: Never    Marital Status: Divorced   Past Medical History:  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 06/15/2018   Cancer of skin of neck    Cataract    Chronic mesenteric ischemia ([name])    Chronic pain syndrome 07/09/2016   Crohn's disease of small and large intestines ([name])    followed by dr Baldo Ash gessner   Dairy product intolerance    Diarrhea, functional    Family history of adverse reaction to anesthesia    Grover's disease    transient acantholytic dermatosis   History of alcohol abuse    History of basal cell carcinoma excision    2013 left leg   History of Clostridium difficile    10/ 2014   History of multiple concussions    x6   last one Jan 2017 per pt--  no residual   History of substance abuse ([name])    quit 1997 per pt   History of suicide attempt    05-18-2012  overdose /  failure to thrive   Iron deficiency anemia due to chronic blood loss    Major depression, recurrent, chronic ([name])    Osteopenia    Personal history of adenomatous colonic polyps 12/2010, 03/2012   12/2010 - 8 mm serrated adenoma of rectum   Portal vein thrombosis 03/21/2015   right   Primary sclerosing cholangitis    ?  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   Substance abuse ([name]) 1997   Alcohol   Vitamin A deficiency 05/23/2018   Vitamin B6 deficiency 07/27/2018   Vitamin C deficiency 05/23/2018    Past Surgical History:  Procedure Laterality Date   ABDOMINAL AORTAGRAM N/A 03/29/2012   Procedure: ABDOMINAL Ronny Flurry;  Surgeon: Nada Libman, MD;  Location[facility] CATH LAB;  Service: Cardiovascular;  Laterality: N/A;   ADENOIDECTOMY  age 45   BIOPSY  05/31/2018   Procedure: BIOPSY;  Surgeon: Rachael Fee, MD;  Location: [facility] ENDOSCOPY;  Service: Endoscopy;;   CATARACT EXTRACTION W/ INTRAOCULAR LENS  IMPLANT, BILATERAL  2009   COLONOSCOPY  2001, 05/02/2003, 01/28/11   2012: Right colon Crohn's, rectal polyp   COLONOSCOPY  03/31/2012   Procedure: COLONOSCOPY;  Surgeon: Beverley Fiedler, MD;  Location: [facility] ENDOSCOPY;  Service: Gastroenterology;  Laterality: N/A;   COLONOSCOPY WITH PROPOFOL N/A 05/31/2018   Procedure: COLONOSCOPY WITH PROPOFOL;  Surgeon: Rachael Fee, MD;  Location: [facility] ENDOSCOPY;  Service: Endoscopy;  Laterality: N/A;   ESOPHAGOGASTRODUODENOSCOPY  01/28/2011   Normal   FOOT SURGERY Right age 1   MOHS SURGERY Left 11/2013   left ankle parakerotosis    PERCUTANEOUS LIVER BIOPSY  2007 and 2008   PILONIDAL CYST EXCISION  age 52   PLACEMENT OF SETON N/A 11/06/2015   Procedure: PLACEMENT OF SETON;  Surgeon: Romie Levee, MD;  Location: [facility];  Service: General;  Laterality: N/A;   PLACEMENT OF SETON  07/2018   at [facility]   RECTAL EXAM UNDER ANESTHESIA N/A 01/18/2019   Procedure: ANAL EXAM UNDER ANESTHESIA,  INCISION AND DRAINAGE, SETON PLACEMENT;  Surgeon: Romie Levee, MD;  Location: [facility];  Service: General;  Laterality: N/A;   UPPER GASTROINTESTINAL ENDOSCOPY      Current Medications: Current Facility-Administered Medications  Medication Dose Route Frequency Provider Last  Rate Last Admin   acetaminophen (TYLENOL) tablet 650 mg  650 mg Oral Q6H PRN Motley-Mangrum, Jadeka A, PMHNP       alum & mag hydroxide-simeth (MAALOX/MYLANTA) 200-200-20 MG/5ML suspension 30 mL  30 mL Oral Q4H PRN Motley-Mangrum, Jadeka A, PMHNP       ARIPiprazole (ABILIFY) tablet 5 mg  5 mg Oral QHS Verner Chol, MD   5 mg at 05/04/23 2100   doxycycline (VIBRA-TABS) tablet 100 mg  100 mg Oral Q12H Floydene Flock, MD   100 mg at 05/05/23 4098   feeding supplement (ENSURE ENLIVE / ENSURE PLUS) liquid 237 mL  1 Bottle Oral TID BM Verner Chol, MD   237 mL at 05/05/23 1517   hydrOXYzine (ATARAX) tablet 25 mg  25 mg Oral TID PRN Motley-Mangrum, Ezra Sites, PMHNP   25 mg at 05/05/23 0358   liver oil-zinc oxide (DESITIN) 40 % ointment   Topical Q12H PRN Verner Chol, MD   Given at 05/04/23 0803   LORazepam (ATIVAN) injection 1 mg  1 mg Intramuscular Q4H PRN Remington, Amber E, NP       LORazepam (ATIVAN) injection 2 mg  2 mg Intramuscular TID PRN Motley-Mangrum, Jadeka A, PMHNP       LORazepam (ATIVAN) tablet 1 mg  1 mg Oral Q4H PRN Remington, Amber E, NP   1 mg at 05/05/23 1105   magnesium hydroxide (MILK OF MAGNESIA) suspension 30 mL  30 mL Oral Daily PRN Motley-Mangrum, Jadeka A, PMHNP       multivitamin with minerals tablet 1 tablet  1 tablet Oral Daily Verner Chol, MD   1 tablet at 05/05/23 0811   neomycin-bacitracin-polymyxin [phone] OINT   Topical Q6H PRN Lewanda Rife, MD   1 Application at 05/05/23 0817   ondansetron (ZOFRAN) injection 4 mg  4 mg Intravenous Q6H PRN Verner Chol, MD   4 mg at 04/16/23 0905   ondansetron (ZOFRAN-ODT) disintegrating tablet 4 mg  4 mg Oral Q8H PRN Verner Chol, MD       predniSONE (DELTASONE) tablet 30 mg  30 mg Oral Q breakfast  Motley-Mangrum, Ezra Sites, PMHNP   30 mg at 05/05/23 1610   venlafaxine XR (EFFEXOR-XR) 24 hr capsule 150 mg  150 mg Oral Q breakfast Lewanda Rife, MD   150 mg at 05/05/23 9604        Blood Alcohol  level:  Lab Results  Component Value Date   ETH <10 04/13/2023   ETH <10 03/28/2023    Metabolic Disorder Labs: Lab Results  Component Value Date   HGBA1C 4.8 04/03/2023   MPG 91.06 04/03/2023   No results found for: "PROLACTIN" Lab Results  Component Value Date   CHOL 209 (H) 04/03/2023   TRIG 75 04/03/2023   HDL 65 04/03/2023   CHOLHDL 3.2 04/03/2023   VLDL 15 04/03/2023   LDLCALC 129 (H) 04/03/2023    Psychiatric Specialty Exam:  Presentation  General Appearance:  Appropriate for Environment; Casual   Eye Contact: Fair   Speech: Clear and Coherent   Speech Volume: Normal       Mood and Affect  Mood: " better"   Affect: Less constricted     Thought Process  Thought Processes: Coherent   Descriptions of Associations:Intact   Orientation:Full (Time, Place and Person)   Thought Content:Logical   Hallucinations:Hallucinations: None   Ideas of Reference:None   Suicidal Thoughts:Suicidal Thoughts: No   Homicidal Thoughts:Homicidal Thoughts: No     Sensorium  Memory: Immediate Fair; Recent Fair; Remote Fair   Judgment: Impaired   Insight: Shallow     Executive Functions  Concentration: Fair   Attention Span: Fair   Recall: [employer] of Knowledge: Fair   Language: Fair     Psychomotor Activity  Psychomotor Activity: Psychomotor Activity: Normal   Musculoskeletal: Strength & Muscle Tone: decreased Gait & Station: Improved, patient walks with the help of a walker Assets  Assets: Manufacturing systems engineer; Desire for Improvement; Resilience   Physical Exam: Physical Exam Vitals and nursing note reviewed.  HENT:     Head: Normocephalic.     Nose: No congestion.     Mouth/Throat:     Mouth: Mucous membranes are moist.  Eyes:     Pupils: Pupils are equal, round, and reactive to light.  Cardiovascular:     Rate and Rhythm: Normal rate.     Pulses: Normal pulses.  Pulmonary:     Breath sounds: Normal breath  sounds.  Abdominal:     General: Bowel sounds are normal.  Neurological:     General: No focal deficit present.     Mental Status: He is alert.    Review of Systems  Constitutional:  Positive for malaise/fatigue.  HENT: Negative.    Eyes: Negative.   Cardiovascular: Negative.   Gastrointestinal: Negative.   Skin: Negative.   Blood pressure 110/74, pulse 81, temperature 98 F (36.7 C), resp. rate 18, height 6\' 1"  (1.854 m), weight 60.6 kg, SpO2 98%. Body mass index is 17.61 kg/m.  Diagnosis: Principal Problem:   MDD (major depressive disorder), recurrent severe, without psychosis ([name]) Active Problems:   GAD (generalized anxiety disorder)   Tremor  Clinical decision making-patient is currently admitted after a lethal suicide attempt by overdosing on her antidepressants.  Patient is noted to have disabling physical problems including Crohn's disease, chronic pain, physical weakness.  Patient continues to struggle with hopelessness, anhedonia, severe anxiety and panic attacks.  His suicidal ideation have improved to intermittent occurrence and denies any hallucinations.  We continue to adjust the medications to help with his treatment resistant depression.  He continues  to need inpatient psychiatric hospitalization. PLAN: Safety and Monitoring:  -- Voluntary admission to inpatient psychiatric unit for safety, stabilization and treatment  -- Daily contact with patient to assess and evaluate symptoms and progress in treatment  -- Patient's case to be discussed in multi-disciplinary team meeting  -- Observation Level : q15 minute checks  -- Vital signs:  q12 hours  -- Precautions: suicide, elopement, and assault -- Encouraged patient to participate in unit milieu and in scheduled group therapies  2. Psychiatric Diagnoses and Treatment:  Increase the dose of Effexor Xr to 150 mg daily, 05/05/23  Continue Abilify 5 mg at bedtime for mood stabilization given the intensity of depression.   Discussed thoroughly the side effects risk benefits of Abilify and patient gave consent for the medication.    3. Medical Issues Being Addressed:  None today   4. Discharge Planning:   -- Social work and case management to assist with discharge planning and identification of hospital follow-up needs prior to discharge  -- Discharge date to be determined, Child psychotherapist and patient's family is working on home health care services  Lewanda Rife, MD

## 2023-05-05 NOTE — Progress Notes (Signed)
   05/05/23 0630  15 Minute Checks  Location Bedroom  Visual Appearance Calm  Behavior Sleeping  Sleep (Behavioral Health Patients Only)  Calculate sleep? (Click Yes once per 24 hr at 0600 safety check) Yes  Documented sleep last 24 hours 10.75

## 2023-05-05 NOTE — Group Note (Signed)
 Recreation Therapy Group Note   Group Topic:Relaxation  Group Date: 05/05/2023 Start Time: 1100 End Time: 1135 Facilitators: Rosina Lowenstein, LRT, CTRS Location:  Dayroom  Group Description: PMR (Progressive Muscle Relaxation). LRT educates patients on what PMR is and the benefits that come from it. Patients are asked to sit with their feet flat on the floor while sitting up and all the way back in their chair, if possible. LRT and pts follow a prompt through a speaker that requires you to tense and release different muscles in their body and focus on their breathing. During session, lights are off and soft music is being played. LRT provides patients with an education handout on PMR.     Goal Area(s) Addressed:    Patients will be able to describe progressive muscle relaxation.    Patient will practice using relaxation technique.   Patient will identify a new coping skill.    Patient will follow multistep directions to reduce anxiety and stress.    Affect/Mood: N/A   Participation Level: Did not attend    Clinical Observations/Individualized Feedback: Patient did not attend group.   Plan: Continue to engage patient in RT group sessions 2-3x/week.   Rosina Lowenstein, LRT, CTRS 05/05/2023 12:42 PM

## 2023-05-05 NOTE — Group Note (Signed)
 Recreation Therapy Group Note   Group Topic:Emotion Expression  Group Date: 05/05/2023 Start Time: 1500 End Time: 1550 Facilitators: Rosina Lowenstein, LRT, CTRS Location:  Dayroom  Group Description: Painting a Diplomatic Services operational officer. Patients and LRT discuss what it means to be "at peace", what it feels like physically and mentally. Pts are given a canvas and watercolor paint to use and encouraged to draw their idea of a peaceful place. Pts and LRT discuss how they use this in their daily life post discharge. Pts are encouraged to take their canvas home with them as a reminder to find their peaceful place whenever they are feeling depressed, anxious, etc.    Goal Area(s) Addressed:  Patient will identify what it means to experience a "peaceful" emotion. Patient will identify a new coping skill.  Patient will express their emotions through art. Patients will increase communication by talking with LRT and peers while in group.   Affect/Mood: Appropriate and Flat   Participation Level: Active   Participation Quality: Independent   Behavior: Calm and Cooperative   Speech/Thought Process: Coherent   Insight: Good   Judgement: Good   Modes of Intervention: Art, Exploration, Music, Open Conversation, Socialization, and Support   Patient Response to Interventions:  Attentive, Engaged, and Receptive   Education Outcome:  Acknowledges education   Clinical Observations/Individualized Feedback: [name] was active in their participation of session activities and group discussion. Pt identified "I hope to retire and get a house boat" as his peaceful place. Pt interacted well with LRT and peers duration of session.    Plan: Continue to engage patient in RT group sessions 2-3x/week.   [id number] LRT, CTRS 05/05/2023 5:22 PM

## 2023-05-05 NOTE — Progress Notes (Signed)
 Patient pleasant and cooperative. Sad affect.  Brightens with interaction. Endorses anxiety and depression.  Denies SI/HI and AVH.  Reports he slept well.  Denies pain.  Compliant with scheduled medications.  15 min checks in place for safety.   Patient is present in the milieu.  Appropriate interaction with peers.

## 2023-05-05 NOTE — BH IP Treatment Plan (Signed)
 Interdisciplinary Treatment and Diagnostic Plan Update  05/05/2023 Time of Session: 11:30 AM  [name] MRN: [id number]  Principal Diagnosis: MDD (major depressive disorder), recurrent severe, without psychosis ([name])  Secondary Diagnoses: Principal Problem:   MDD (major depressive disorder), recurrent severe, without psychosis ([name]) Active Problems:   GAD (generalized anxiety disorder)   Tremor   Current Medications:  Current Facility-Administered Medications  Medication Dose Route Frequency Provider Last Rate Last Admin   acetaminophen (TYLENOL) tablet 650 mg  650 mg Oral Q6H PRN Motley-Mangrum, Jadeka A, PMHNP       alum & mag hydroxide-simeth (MAALOX/MYLANTA) 200-200-20 MG/5ML suspension 30 mL  30 mL Oral Q4H PRN Motley-Mangrum, Jadeka A, PMHNP       ARIPiprazole (ABILIFY) tablet 5 mg  5 mg Oral QHS Verner Chol, MD   5 mg at 05/04/23 2100   doxycycline (VIBRA-TABS) tablet 100 mg  100 mg Oral Q12H Floydene Flock, MD   100 mg at 05/05/23 7829   feeding supplement (ENSURE ENLIVE / ENSURE PLUS) liquid 237 mL  1 Bottle Oral TID BM Verner Chol, MD   237 mL at 05/05/23 1517   hydrOXYzine (ATARAX) tablet 25 mg  25 mg Oral TID PRN Motley-Mangrum, Ezra Sites, PMHNP   25 mg at 05/05/23 0358   liver oil-zinc oxide (DESITIN) 40 % ointment   Topical Q12H PRN Verner Chol, MD   Given at 05/04/23 0803   LORazepam (ATIVAN) injection 1 mg  1 mg Intramuscular Q4H PRN Remington, Amber E, NP       LORazepam (ATIVAN) injection 2 mg  2 mg Intramuscular TID PRN Motley-Mangrum, Geralynn Ochs A, PMHNP       LORazepam (ATIVAN) tablet 1 mg  1 mg Oral Q4H PRN Remington, Amber E, NP   1 mg at 05/05/23 1105   magnesium hydroxide (MILK OF MAGNESIA) suspension 30 mL  30 mL Oral Daily PRN Motley-Mangrum, Jadeka A, PMHNP       multivitamin with minerals tablet 1 tablet  1 tablet Oral Daily Verner Chol, MD   1 tablet at 05/05/23 0811   neomycin-bacitracin-polymyxin [phone] OINT   Topical Q6H PRN  Lewanda Rife, MD   1 Application at 05/05/23 0817   ondansetron (ZOFRAN) injection 4 mg  4 mg Intravenous Q6H PRN Verner Chol, MD   4 mg at 04/16/23 0905   ondansetron (ZOFRAN-ODT) disintegrating tablet 4 mg  4 mg Oral Q8H PRN Verner Chol, MD       predniSONE (DELTASONE) tablet 30 mg  30 mg Oral Q breakfast Motley-Mangrum, Jadeka A, PMHNP   30 mg at 05/05/23 0811   venlafaxine XR (EFFEXOR-XR) 24 hr capsule 150 mg  150 mg Oral Q breakfast Lewanda Rife, MD   150 mg at 05/05/23 5621   PTA Medications: Medications Prior to Admission  Medication Sig Dispense Refill Last Dose/Taking   busPIRone (BUSPAR) 10 MG tablet Take 1 tablet (10 mg total) by mouth 2 (two) times daily. 30 tablet 0 04/12/2023   dicyclomine (BENTYL) 10 MG capsule Take 1 capsule (10 mg total) by mouth 3 (three) times daily before meals. 90 capsule 0 Taking   escitalopram (LEXAPRO) 10 MG tablet Take 1 tablet (10 mg total) by mouth at bedtime. 30 tablet 0 04/13/2023   ketoconazole (NIZORAL) 2 % cream Apply 1 Application topically daily.   Taking   LORazepam (ATIVAN) 0.5 MG tablet Take 0.5 mg by mouth daily.   Taking   mirtazapine (REMERON) 30 MG tablet Take 1 tablet (30 mg total) by  mouth at bedtime. 30 tablet 0 04/11/2023   predniSONE (DELTASONE) 20 MG tablet Take 1.5 tablets (30 mg total) by mouth daily with breakfast. 100 tablet 2 Past Month    Patient Stressors:    Patient Strengths:    Treatment Modalities: Medication Management, Group therapy, Case management,  1 to 1 session with clinician, Psychoeducation, Recreational therapy.   Physician Treatment Plan for Primary Diagnosis: MDD (major depressive disorder), recurrent severe, without psychosis ([name]) Long Term Goal(s):     Short Term Goals:    Medication Management: Evaluate patient's response, side effects, and tolerance of medication regimen.  Therapeutic Interventions: 1 to 1 sessions, Unit Group sessions and Medication  administration.  Evaluation of Outcomes: Progressing  Physician Treatment Plan for Secondary Diagnosis: Principal Problem:   MDD (major depressive disorder), recurrent severe, without psychosis ([name]) Active Problems:   GAD (generalized anxiety disorder)   Tremor  Long Term Goal(s):     Short Term Goals:       Medication Management: Evaluate patient's response, side effects, and tolerance of medication regimen.  Therapeutic Interventions: 1 to 1 sessions, Unit Group sessions and Medication administration.  Evaluation of Outcomes: Progressing   RN Treatment Plan for Primary Diagnosis: MDD (major depressive disorder), recurrent severe, without psychosis ([name]) Long Term Goal(s): Knowledge of disease and therapeutic regimen to maintain health will improve  Short Term Goals: Ability to remain free from injury will improve, Ability to verbalize frustration and anger appropriately will improve, Ability to demonstrate self-control, Ability to participate in decision making will improve, Ability to verbalize feelings will improve, Ability to disclose and discuss suicidal ideas, Ability to identify and develop effective coping behaviors will improve, and Compliance with prescribed medications will improve  Medication Management: RN will administer medications as ordered by provider, will assess and evaluate patient's response and provide education to patient for prescribed medication. RN will report any adverse and/or side effects to prescribing provider.  Therapeutic Interventions: 1 on 1 counseling sessions, Psychoeducation, Medication administration, Evaluate responses to treatment, Monitor vital signs and CBGs as ordered, Perform/monitor CIWA, COWS, AIMS and Fall Risk screenings as ordered, Perform wound care treatments as ordered.  Evaluation of Outcomes: Progressing   LCSW Treatment Plan for Primary Diagnosis: MDD (major depressive disorder), recurrent severe, without psychosis  ([name]) Long Term Goal(s): Safe transition to appropriate next level of care at discharge, Engage patient in therapeutic group addressing interpersonal concerns.  Short Term Goals: Engage patient in aftercare planning with referrals and resources, Increase social support, Increase ability to appropriately verbalize feelings, Increase emotional regulation, Facilitate acceptance of mental health diagnosis and concerns, Facilitate patient progression through stages of change regarding substance use diagnoses and concerns, Identify triggers associated with mental health/substance abuse issues, and Increase skills for wellness and recovery  Therapeutic Interventions: Assess for all discharge needs, 1 to 1 time with Social worker, Explore available resources and support systems, Assess for adequacy in community support network, Educate family and significant other(s) on suicide prevention, Complete Psychosocial Assessment, Interpersonal group therapy.  Evaluation of Outcomes: Progressing   Progress in Treatment: Attending groups: Yes. and No. Participating in groups: Yes. and No. Taking medication as prescribed: Yes. Toleration medication: Yes. Family/Significant other contact made: Yes, individual(s) contacted:  Feliberto Harts, brother , [place], mom Patient understands diagnosis: Yes. Discussing patient identified problems/goals with staff: Yes. Medical problems stabilized or resolved: Yes. Denies suicidal/homicidal ideation: Yes. Issues/concerns per patient self-inventory: No. Other: none identified   New problem(s) identified: No, Describe:   No, Describe:  None identified  Update 04/20/23: No changes at this time Update 04/25/23: No changes at this time Update 04/30/23: No changes at this time Update 05/05/23: No changes at this time   New Short Term/Long Term Goal(s): elimination of symptoms of psychosis, medication management for mood stabilization; elimination of SI thoughts; development  of comprehensive mental wellness plan. Update 04/20/23: No changes at this time Update 04/25/23: No changes at this time 3/1/205 none at this time Update 05/05/23: No changes at this time       Patient Goals:  " I want to get control of my anxiety" Update 04/20/23: No changes at this time Update 04/25/23: No changes at this time 04/30/23 no changes Update 05/05/23: No changes at this time     Discharge Plan or Barriers: CSW to assist with appropriate discharge planning Update 04/20/23: No changes at this time Update 04/25/23: Pt's brother is assisting him with finding independent living facility. CSW will check with  pt's brother on progress at this time. Provider has suggested ECT as a possibility depression does not seem to be getting better Update 05/05/23: Pt's family wants him to go to [place], residential treatment center. CSW to assist with admissions process   Reason for Continuation of Hospitalization: Anxiety medication stabilization, depression   Estimated Length of Stay: 1-7 days Update 05/05/23: TBD    Last 3 [place] Suicide Severity Risk Score: Flowsheet Row Admission (Current) from 04/13/2023 in [facility] Most recent reading at 04/13/2023  7:45 PM ED from 04/13/2023 in [facility] Most recent reading at 04/13/2023  4:16 AM ED from 04/11/2023 in [facility] Most recent reading at 04/11/2023  1:11 PM  C-SSRS RISK CATEGORY High Risk High Risk No Risk       Last PHQ 2/9 Scores:    06/06/2020   11:06 AM 03/06/2020    2:49 PM 08/28/2019    1:04 PM  Depression screen PHQ 2/9  Decreased Interest 0 0 0  Down, Depressed, Hopeless 0 1 1  PHQ - 2 Score 0 1 1  Altered sleeping 1 2 3   Tired, decreased energy 1 2 3   Change in appetite 1 1 1   Feeling bad or failure about yourself  0 0 0  Trouble concentrating 1 2 3   Moving slowly or fidgety/restless 1 0 0  Suicidal thoughts 0 0 0  PHQ-9 Score 5 8 11    Difficult doing work/chores   Somewhat difficult    Scribe for Treatment Team: Elza Rafter, Theresia Majors 05/05/2023 4:28 PM

## 2023-05-05 NOTE — Plan of Care (Signed)
   Problem: Education: Goal: Emotional status will improve Outcome: Progressing Goal: Mental status will improve Outcome: Progressing   Problem: Activity: Goal: Interest or engagement in activities will improve Outcome: Progressing

## 2023-05-06 DIAGNOSIS — F332 Major depressive disorder, recurrent severe without psychotic features: Secondary | ICD-10-CM | POA: Diagnosis not present

## 2023-05-06 NOTE — Group Note (Signed)
 Date:  05/06/2023 Time:  5:05 PM  Group Topic/Focus:  Wellness Toolbox:   The focus of this group is to discuss various aspects of wellness, balancing those aspects and exploring ways to increase the ability to experience wellness.  Patients will create a wellness toolbox for use upon discharge.    Participation Level:  Active  Participation Quality:  Appropriate and Attentive  Affect:  Appropriate  Cognitive:  Alert and Appropriate  Insight: Appropriate and Good  Engagement in Group:  Engaged  Modes of Intervention:  Socialization  Additional Comments:     Alexis Frock 05/06/2023, 5:05 PM

## 2023-05-06 NOTE — Plan of Care (Signed)
 Pt denies SI/HI/AVH and pain, but c/o anxiety and depression. Pt is pleasant and cooperative. Pt was offered support and encouragement. Pt was given scheduled medications and PRN meds requested. Pt was encourage to attend groups, and attended evening wrap up group after snack. Q 15 minute safety checks maintained/ongoing. Pt receptive to treatment and safety maintained on unit.   Problem: Education: Goal: Knowledge of [place] [facility] Education information/materials will improve Outcome: Progressing Goal: Emotional status will improve Outcome: Progressing Goal: Mental status will improve Outcome: Progressing Goal: Verbalization of understanding the information provided will improve Outcome: Progressing   Problem: Coping: Goal: Ability to verbalize frustrations and anger appropriately will improve Outcome: Progressing Goal: Ability to demonstrate self-control will improve Outcome: Progressing   Problem: Health Behavior/Discharge Planning: Goal: Identification of resources available to assist in meeting health care needs will improve Outcome: Progressing Goal: Compliance with treatment plan for underlying cause of condition will improve Outcome: Progressing   Problem: Safety: Goal: Periods of time without injury will increase Outcome: Progressing

## 2023-05-06 NOTE — Group Note (Signed)
 Date:  05/06/2023 Time:  8:37 PM  Group Topic/Focus:  Wrap-Up Group:   The focus of this group is to help patients review their daily goal of treatment and discuss progress on daily workbooks.    Participation Level:  Active  Participation Quality:  Appropriate  Affect:  Appropriate  Cognitive:  Appropriate  Insight: Good  Engagement in Group:  Engaged  Modes of Intervention:  Discussion  Additional Comments:    Burt Ek 05/06/2023, 8:37 PM

## 2023-05-06 NOTE — Progress Notes (Addendum)
 0253 Pt requested something for sleep/anxiety. Hydroxyzine given. Denies SI/HI/AVH and pain. Pt returned to bed with eyes closed, respirations even and unlabored. Q15 min safety checks performed/ongoing.    05/05/23 2154  Psych Admission Type (Psych Patients Only)  Admission Status Involuntary  Psychosocial Assessment  Patient Complaints Anxiety;Depression  Eye Contact Fair  Facial Expression Sad  Affect Sad  Speech Logical/coherent  Interaction Assertive  Motor Activity Slow  Appearance/Hygiene In scrubs  Behavior Characteristics Cooperative;Appropriate to situation  Mood Sad;Pleasant  Aggressive Behavior  Effect No apparent injury  Thought Process  Coherency WDL  Content WDL  Delusions None reported or observed  Perception WDL  Hallucination None reported or observed  Judgment Limited  Confusion None  Danger to Self  Current suicidal ideation? Denies  Self-Injurious Behavior No self-injurious ideation or behavior indicators observed or expressed   Agreement Not to Harm Self Yes  Description of Agreement Verbal  Danger to Others  Danger to Others None reported or observed

## 2023-05-06 NOTE — Progress Notes (Signed)
.  Occupational Therapy Group Note   Group Topic:  Seated Upper Extremity Exercises with Theraband  Group Date: 05/06/2023             Group Time (start and end): 1:10pm - 1:55pm Facilitators: Wynona Canes, OTR/L    Group Description: Group instructed in series of upper extremities exercises, aimed to promote strength, flexibility, range of motion and functional endurance.  Patients provided cuing for proper mechanics and proper pace of exercise; exercises adjusted as necessary for individualized patient needs.  Patient also engaged in cognitive components throughout session, working to integrate attention to task, command following, turn-taking and appropriate social interaction throughout session.  Allowed to ask questions as appropriate, and encouraged to identify specific exercises that they could complete independently outside of group sessions.     Therapeutic Goal(s): Demonstrate appropriate performance of upper extremity exercises to promote strength, flexibility, range of motion and functional endurance Identify 2-3 specific upper extremity exercises to complete as home exercise program outside of group session     Individual Participation: Pt actively engaged throughout. Pt engaged with therapist and other patients. Pt demonstrated proper technique for all exercises with PRN VC. Pt demonstrated fair insight into ways to incorporate exercises into daily routines.     Participation Level: active    Participation Quality: Engaged, shared when prompted     Behavior: appropriate    Speech/Thought Process: appropriate    Affect/Mood: appropriate    Insight: fair    Judgement: good    Modes of Intervention: Visual demonstration, handout, hands on practice with equipment         Plan: Continue to engage patient in PT/OT groups 1-2x/week.   Arman Filter., MPH, MS, OTR/L ascom [phone] 05/06/23, 4:42 PM

## 2023-05-06 NOTE — Plan of Care (Signed)
   Problem: Education: Goal: Emotional status will improve Outcome: Progressing Goal: Mental status will improve Outcome: Progressing   Problem: Activity: Goal: Interest or engagement in activities will improve Outcome: Progressing

## 2023-05-06 NOTE — BHH Counselor (Signed)
 CSW sent referral to [facility] as requested by family member.    Reynaldo Minium, [name], [place] 05/06/2023 4:30 PM

## 2023-05-06 NOTE — Progress Notes (Signed)
   05/06/23 0615  15 Minute Checks  Location Bedroom  Visual Appearance Calm  Behavior Sleeping  Sleep (Behavioral Health Patients Only)  Calculate sleep? (Click Yes once per 24 hr at 0600 safety check) Yes  Documented sleep last 24 hours 9

## 2023-05-06 NOTE — Progress Notes (Signed)
 [facility] MD Progress Note  05/06/2023  [name]  MRN:  [id number]  [name] is a 58 year old Caucasian male with a history of MDD, anxiety, Crohn's disease, and chronic mesenteric ischemia who presented to [facility] emergency department on 04/13/23 at 0404 for suicide attempt by ingestion of 28 tablets of 10mg  Lexapro at 0200. Per ED documentation, patient had some mild symptoms of serotonin syndrome on arrival, including acute agitation and clonus, with symptomatic improvement with benzodiazepines. Did have a return of symptoms requiring additional dose of lorazepam. Poison control advised the total amount of ingested Lexapro likely is not enough to cause serotonin syndrome by itself and they recommended 8-hour cardiac monitoring. Qtc 498.  Subjective:  Chart reviewed, case discussed in multidisciplinary meeting, patient seen during rounds.  Staff reports patient is doing better on the unit. Today during assessment patient reports "fine" mood. Patient reports his sleep and appetite has improved. He denies auditory/visual hallucinations.He denies any intention or plan to harm himself or others.Reports sometimes he feels anxious about his placement.  Patient was provided with support and reassurance. Patient was informed that CSW has sent referral to [facility] as requested by family member. He denies side effect from increase in the dose of Effexor.   Appetite:  Fair  Past Psychiatric History: see h&P Family History:  Family History  Problem Relation Age of Onset   Heart disease Father    Thyroid cancer Father    Allergies Father    Clotting disorder Father    Breast cancer Mother    Stomach cancer Mother    Colon cancer Neg Hx    Esophageal cancer Neg Hx    Rectal cancer Neg Hx    Social History:  Social History   Substance and Sexual Activity  Alcohol Use No   Alcohol/week: 0.0 standard drinks of alcohol     Social History   Substance and Sexual Activity  Drug Use Not  Currently   Types: Cocaine, Marijuana   Comment: QUIT USING DRUGS IN 1997    Social History   Socioeconomic History   Marital status: Single    Spouse name: Not on file   Number of children: 1   Years of education: Not on file   Highest education level: Not on file  Occupational History   Occupation: Disability   Tobacco Use   Smoking status: Former    Current packs/day: 0.00    Average packs/day: 1 pack/day for 15.0 years (15.0 ttl pk-yrs)    Types: Cigarettes    Start date: 03/28/1982    Quit date: 03/28/1997    Years since quitting: 26.1   Smokeless tobacco: Never  Vaping Use   Vaping status: Never Used  Substance and Sexual Activity   Alcohol use: No    Alcohol/week: 0.0 standard drinks of alcohol   Drug use: Not Currently    Types: Cocaine, Marijuana    Comment: QUIT USING DRUGS IN 1997   Sexual activity: Not Currently  Other Topics Concern   Not on file  Social History Narrative   Single, history of substance abuse in recovery   Previously occupied on medical disability   1 child  Daughter Wellsite geologist, lives and works in [place]   Elderly parents involved and he helps them   1 brother   Social Drivers of Corporate investment banker Strain: Low Risk  (08/28/2019)   Overall Financial Resource Strain (CARDIA)    Difficulty of Paying Living Expenses: Not hard at all  Food Insecurity: Food Insecurity Present (04/13/2023)   Hunger Vital Sign    Worried About Running Out of Food in the Last Year: Never true    Ran Out of Food in the Last Year: Sometimes true  Transportation Needs: No Transportation Needs (04/13/2023)   PRAPARE - Administrator, Civil Service (Medical): No    Lack of Transportation (Non-Medical): No  Physical Activity: Insufficiently Active (11/08/2019)   Exercise Vital Sign    Days of Exercise per Week: 3 days    Minutes of Exercise per Session: 30 min  Stress: Stress Concern Present (08/28/2019)   [employer] of Occupational Health -  Occupational Stress Questionnaire    Feeling of Stress : Very much  Social Connections: Socially Isolated (03/28/2023)   Social Connection and Isolation Panel [NHANES]    Frequency of Communication with Friends and Family: Three times a week    Frequency of Social Gatherings with Friends and Family: Twice a week    Attends Religious Services: Never    Database administrator or Organizations: No    Attends Engineer, structural: Never    Marital Status: Divorced   Past Medical History:  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 06/15/2018   Cancer of skin of neck    Cataract    Chronic mesenteric ischemia ([name])    Chronic pain syndrome 07/09/2016   Crohn's disease of small and large intestines ([name])    followed by dr Baldo Ash gessner   Dairy product intolerance    Diarrhea, functional    Family history of adverse reaction to anesthesia    Grover's disease    transient acantholytic dermatosis   History of alcohol abuse    History of basal cell carcinoma excision    2013 left leg   History of Clostridium difficile    10/ 2014   History of multiple concussions    x6   last one Jan 2017 per pt--  no residual   History of substance abuse ([name])    quit 1997 per pt   History of suicide attempt    05-18-2012  overdose /  failure to thrive   Iron deficiency anemia due to chronic blood loss    Major depression, recurrent, chronic ([name])    Osteopenia    Personal history of adenomatous colonic polyps 12/2010, 03/2012   12/2010 - 8 mm serrated adenoma of rectum   Portal vein thrombosis 03/21/2015   right   Primary sclerosing cholangitis    ? 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    Substance abuse ([name]) 1997   Alcohol   Vitamin A deficiency 05/23/2018   Vitamin B6 deficiency 07/27/2018   Vitamin C deficiency 05/23/2018    Past Surgical History:  Procedure Laterality Date   ABDOMINAL AORTAGRAM N/A 03/29/2012   Procedure: ABDOMINAL Ronny Flurry;  Surgeon: Nada Libman, MD;  Location: [facility] CATH LAB;  Service: Cardiovascular;  Laterality: N/A;   ADENOIDECTOMY  age 65   BIOPSY  05/31/2018   Procedure: BIOPSY;  Surgeon: Rachael Fee, MD;  Location: [facility] ENDOSCOPY;  Service: Endoscopy;;   CATARACT EXTRACTION W/  INTRAOCULAR LENS  IMPLANT, BILATERAL  2009   COLONOSCOPY  2001, 05/02/2003, 01/28/11   2012: Right colon Crohn's, rectal polyp   COLONOSCOPY  03/31/2012   Procedure: COLONOSCOPY;  Surgeon: Beverley Fiedler, MD;  Location: [facility] ENDOSCOPY;  Service: Gastroenterology;  Laterality: N/A;   COLONOSCOPY WITH PROPOFOL N/A 05/31/2018   Procedure: COLONOSCOPY WITH PROPOFOL;  Surgeon: Rachael Fee, MD;  Location: [facility] ENDOSCOPY;  Service: Endoscopy;  Laterality: N/A;   ESOPHAGOGASTRODUODENOSCOPY  01/28/2011   Normal   FOOT SURGERY Right age 56   MOHS SURGERY Left 11/2013   left ankle parakerotosis    PERCUTANEOUS LIVER BIOPSY  2007 and 2008   PILONIDAL CYST EXCISION  age 75   PLACEMENT OF SETON N/A 11/06/2015   Procedure: PLACEMENT OF SETON;  Surgeon: Romie Levee, MD;  Location: [facility] [place];  Service: General;  Laterality: N/A;   PLACEMENT OF SETON  07/2018   at [facility]   RECTAL EXAM UNDER ANESTHESIA N/A 01/18/2019   Procedure: ANAL EXAM UNDER ANESTHESIA,  INCISION AND DRAINAGE, SETON PLACEMENT;  Surgeon: Romie Levee, MD;  Location: [facility];  Service: General;  Laterality: N/A;   UPPER GASTROINTESTINAL ENDOSCOPY      Current Medications: Current Facility-Administered Medications  Medication Dose Route Frequency Provider Last Rate Last Admin   acetaminophen (TYLENOL) tablet 650 mg  650 mg Oral Q6H PRN Motley-Mangrum, Jadeka A, PMHNP        alum & mag hydroxide-simeth (MAALOX/MYLANTA) 200-200-20 MG/5ML suspension 30 mL  30 mL Oral Q4H PRN Motley-Mangrum, Jadeka A, PMHNP       ARIPiprazole (ABILIFY) tablet 5 mg  5 mg Oral QHS Verner Chol, MD   5 mg at 05/05/23 2154   doxycycline (VIBRA-TABS) tablet 100 mg  100 mg Oral Q12H Floydene Flock, MD   100 mg at 05/06/23 4098   feeding supplement (ENSURE ENLIVE / ENSURE PLUS) liquid 237 mL  1 Bottle Oral TID BM Verner Chol, MD   237 mL at 05/06/23 1506   hydrOXYzine (ATARAX) tablet 25 mg  25 mg Oral TID PRN Motley-Mangrum, Ezra Sites, PMHNP   25 mg at 05/06/23 0253   liver oil-zinc oxide (DESITIN) 40 % ointment   Topical Q12H PRN Verner Chol, MD   Given at 05/04/23 0803   LORazepam (ATIVAN) injection 1 mg  1 mg Intramuscular Q4H PRN Remington, Amber E, NP       LORazepam (ATIVAN) injection 2 mg  2 mg Intramuscular TID PRN Motley-Mangrum, Jadeka A, PMHNP       LORazepam (ATIVAN) tablet 1 mg  1 mg Oral Q4H PRN Remington, Amber E, NP   1 mg at 05/06/23 0824   magnesium hydroxide (MILK OF MAGNESIA) suspension 30 mL  30 mL Oral Daily PRN Motley-Mangrum, Jadeka A, PMHNP       multivitamin with minerals tablet 1 tablet  1 tablet Oral Daily Verner Chol, MD   1 tablet at 05/06/23 1191   neomycin-bacitracin-polymyxin [phone] OINT   Topical Q6H PRN Lewanda Rife, MD   1 Application at 05/06/23 0809   ondansetron (ZOFRAN) injection 4 mg  4 mg Intravenous Q6H PRN Verner Chol, MD   4 mg at 04/16/23 0905   ondansetron (ZOFRAN-ODT) disintegrating tablet 4 mg  4 mg Oral Q8H PRN Verner Chol, MD       predniSONE (DELTASONE) tablet 30 mg  30 mg Oral Q breakfast Motley-Mangrum, Jadeka A, PMHNP   30 mg at 05/06/23 0808   venlafaxine XR (EFFEXOR-XR) 24  hr capsule 150 mg  150 mg Oral Q breakfast Lewanda Rife, MD   150 mg at 05/06/23 1610        Blood Alcohol level:  Lab Results  Component Value Date   ETH <10 04/13/2023   ETH <10 03/28/2023    Metabolic Disorder  Labs: Lab Results  Component Value Date   HGBA1C 4.8 04/03/2023   MPG 91.06 04/03/2023   No results found for: "PROLACTIN" Lab Results  Component Value Date   CHOL 209 (H) 04/03/2023   TRIG 75 04/03/2023   HDL 65 04/03/2023   CHOLHDL 3.2 04/03/2023   VLDL 15 04/03/2023   LDLCALC 129 (H) 04/03/2023    Psychiatric Specialty Exam:  Presentation  General Appearance:  Appropriate for Environment; Casual   Eye Contact: Fair   Speech: Clear and Coherent   Speech Volume: Normal       Mood and Affect  Mood: "Fine"   Affect: Less constricted     Thought Process  Thought Processes: Coherent   Descriptions of Associations:Intact   Orientation:Full (Time, Place and Person)   Thought Content:Logical   Hallucinations:Hallucinations: None   Ideas of Reference:None   Suicidal Thoughts:Suicidal Thoughts: No   Homicidal Thoughts:Homicidal Thoughts: No     Sensorium  Memory: Immediate Fair; Recent Fair; Remote Fair   Judgment: Impaired   Insight: Shallow     Executive Functions  Concentration: Fair   Attention Span: Fair   Recall: [employer] of Knowledge: Fair   Language: Fair     Psychomotor Activity  Psychomotor Activity: Psychomotor Activity: Normal   Musculoskeletal: Strength & Muscle Tone: decreased Gait & Station: Improved, patient walks with the help of a walker Assets  Assets: Manufacturing systems engineer; Desire for Improvement; Resilience   Physical Exam: Physical Exam Vitals and nursing note reviewed.  HENT:     Head: Normocephalic.     Nose: No congestion.     Mouth/Throat:     Mouth: Mucous membranes are moist.  Eyes:     Pupils: Pupils are equal, round, and reactive to light.  Cardiovascular:     Rate and Rhythm: Normal rate.     Pulses: Normal pulses.  Pulmonary:     Breath sounds: Normal breath sounds.  Abdominal:     General: Bowel sounds are normal.  Neurological:     General: No focal deficit present.      Mental Status: He is alert.    Review of Systems  Constitutional:  Positive for malaise/fatigue.  HENT: Negative.    Eyes: Negative.   Cardiovascular: Negative.   Gastrointestinal: Negative.   Skin: Negative.   Blood pressure 92/63, pulse 84, temperature 99.1 F (37.3 C), resp. rate 17, height 6\' 1"  (1.854 m), weight 60.6 kg, SpO2 98%. Body mass index is 17.61 kg/m.  Diagnosis: Principal Problem:   MDD (major depressive disorder), recurrent severe, without psychosis ([name]) Active Problems:   GAD (generalized anxiety disorder)   Tremor  Clinical decision making-patient is currently admitted after a lethal suicide attempt by overdosing on her antidepressants.  Patient is noted to have disabling physical problems including Crohn's disease, chronic pain, physical weakness.  Patient continues to struggle with hopelessness, anhedonia, severe anxiety and panic attacks.  His suicidal ideation have improved to intermittent occurrence and denies any hallucinations.  We continue to adjust the medications to help with his treatment resistant depression.  He continues to need inpatient psychiatric hospitalization. PLAN: Safety and Monitoring:  -- Voluntary admission to inpatient psychiatric unit for  safety, stabilization and treatment  -- Daily contact with patient to assess and evaluate symptoms and progress in treatment  -- Patient's case to be discussed in multi-disciplinary team meeting  -- Observation Level : q15 minute checks  -- Vital signs:  q12 hours  -- Precautions: suicide, elopement, and assault -- Encouraged patient to participate in unit milieu and in scheduled group therapies  2. Psychiatric Diagnoses and Treatment:  Increase the dose of Effexor Xr to 150 mg daily, 05/05/23  Continue Abilify 5 mg at bedtime for mood stabilization given the intensity of depression.  Discussed thoroughly the side effects risk benefits of Abilify and patient gave consent for the medication.    3.  Medical Issues Being Addressed:  None today   4. Discharge Planning:   -- Social work and case management to assist with discharge planning and identification of hospital follow-up needs prior to discharge  -- Discharge date to be determined, Child psychotherapist and patient's family is working on home health care services  Lewanda Rife, MD

## 2023-05-06 NOTE — Progress Notes (Signed)
 Patient is pleasant and cooperative.  Sad affect.  Endorses anxiety and depression.  Denies SI/HI and AVH.  Denies pain.  Reports he slept well.    Compliant with scheduled medications.  15 min checks in place for safety.  Patient is present in the milieu.  Appropriate interaction with peers.

## 2023-05-06 NOTE — Group Note (Signed)
 Recreation Therapy Group Note   Group Topic:General Recreation  Group Date: 05/06/2023 Start Time: 1500 End Time: 1600 Facilitators: Rosina Lowenstein, LRT, CTRS Location:  Dayroom  Group Description: Bingo. LRT and patients played multiple games of Bingo with music playing in the background. LRT and pts discussed how this could be a leisure interest and the importance of doing things they enjoy post-discharge. Pts won stress balls and [employer] as Chief Financial Officer.    Goal Area(s) Addressed: Patient will identify leisure interests.  Patient will practice healthy decision making. Patient will engage in recreation activity.  Patient will increase communication.   Affect/Mood: Appropriate and Flat   Participation Level: Moderate   Participation Quality: Independent   Behavior: Calm and Cooperative   Speech/Thought Process: Coherent   Insight: Fair   Judgement: Fair    Modes of Intervention: Activity   Patient Response to Interventions:  Receptive   Education Outcome:  In group clarification offered    Clinical Observations/Individualized Feedback: [name] was mostly active in their participation of session activities and group discussion. Pt won a Facilities manager and received a stress ball as a prize. Pt minimally interacted with LRT and peers while present in group.    Plan: Continue to engage patient in RT group sessions 2-3x/week.   Rosina Lowenstein, LRT, CTRS 05/06/2023 5:36 PM

## 2023-05-06 NOTE — Group Note (Signed)
 Recreation Therapy Group Note   Group Topic:Leisure Education  Group Date: 05/06/2023 Start Time: 1100 End Time: 1140 Facilitators: Rosina Lowenstein, LRT, CTRS Location:  Dayroom  Group Description: Leisure Education. Patients were given the opportunity to play cards, play chess, or color. Pt identified and conversated about things they enjoy doing in their free time and how they can continue to do that outside of the hospital.  Goal Area(s) Addressed: Patient will practice making a positive decision. Patient will have the opportunity to try a new leisure activity. Patient will communicate with peers and LRT.   Affect/Mood: N/A   Participation Level: Did not attend    Clinical Observations/Individualized Feedback: Patient did not attend group.   Plan: Continue to engage patient in RT group sessions 2-3x/week.   Rosina Lowenstein, LRT, CTRS 05/06/2023 12:31 PM

## 2023-05-07 DIAGNOSIS — F332 Major depressive disorder, recurrent severe without psychotic features: Secondary | ICD-10-CM | POA: Diagnosis not present

## 2023-05-07 NOTE — Plan of Care (Signed)
 D: Pt alert and oriented. Pt reports experiencing anxiety/depression at this time. Pt denies experiencing any pain at this time. Pt denies experiencing any SI/HI, or AVH at this time.   A: Scheduled medications administered to pt, per MD orders. Support and encouragement provided. Frequent verbal contact made. Routine safety checks conducted q15 minutes.   R: No adverse drug reactions noted. Pt verbally contracts for safety at this time. Pt compliant with medications and treatment plan. Pt interacts well with others on the unit. Pt remains safe at this time. Plan of care ongoing.  Problem: Education: Goal: Emotional status will improve Outcome: Not Progressing   Problem: Coping: Goal: Ability to verbalize frustrations and anger appropriately will improve Outcome: Progressing

## 2023-05-07 NOTE — Progress Notes (Signed)
 [facility] MD Progress Note  05/07/2023  [name]  MRN:  [id number]  [name] is a 58 year old Caucasian male with a history of MDD, anxiety, Crohn's disease, and chronic mesenteric ischemia who presented to [facility] emergency department on 04/13/23 at 0404 for suicide attempt by ingestion of 28 tablets of 10mg  Lexapro at 0200. Per ED documentation, patient had some mild symptoms of serotonin syndrome on arrival, including acute agitation and clonus, with symptomatic improvement with benzodiazepines. Did have a return of symptoms requiring additional dose of lorazepam. Poison control advised the total amount of ingested Lexapro likely is not enough to cause serotonin syndrome by itself and they recommended 8-hour cardiac monitoring. Qtc 498.  Subjective:  Chart reviewed, case discussed in multidisciplinary meeting, patient seen during rounds.  Patient reports doing well.  He reports that he is feeling a lot better.  He then talked about his family working on placing him in a 30-day facility in [place] which will be available Thursday of coming week.  He rates his anxiety as 8 out of 10, 10 being the worst and anxiety as 8 out of 10.  He has fair appetite and sleep.  He did endorse getting anxious about the transition.  He denies having any panic attacks.  He denies any active suicidal/homicidal ideation/intent/plan.  He denies auditory/visual hallucinations.  He is taking his medications with no reported side effects   Appetite:  Fair  Past Psychiatric History: see h&P Family History:  Family History  Problem Relation Age of Onset   Heart disease Father    Thyroid cancer Father    Allergies Father    Clotting disorder Father    Breast cancer Mother    Stomach cancer Mother    Colon cancer Neg Hx    Esophageal cancer Neg Hx    Rectal cancer Neg Hx    Social History:  Social History   Substance and Sexual Activity  Alcohol Use No   Alcohol/week: 0.0 standard drinks of  alcohol     Social History   Substance and Sexual Activity  Drug Use Not Currently   Types: Cocaine, Marijuana   Comment: QUIT USING DRUGS IN 1997    Social History   Socioeconomic History   Marital status: Single    Spouse name: Not on file   Number of children: 1   Years of education: Not on file   Highest education level: Not on file  Occupational History   Occupation: Disability   Tobacco Use   Smoking status: Former    Current packs/day: 0.00    Average packs/day: 1 pack/day for 15.0 years (15.0 ttl pk-yrs)    Types: Cigarettes    Start date: 03/28/1982    Quit date: 03/28/1997    Years since quitting: 26.1   Smokeless tobacco: Never  Vaping Use   Vaping status: Never Used  Substance and Sexual Activity   Alcohol use: No    Alcohol/week: 0.0 standard drinks of alcohol   Drug use: Not Currently    Types: Cocaine, Marijuana    Comment: QUIT USING DRUGS IN 1997   Sexual activity: Not Currently  Other Topics Concern   Not on file  Social History Narrative   Single, history of substance abuse in recovery   Previously occupied on medical disability   1 child  Daughter Wellsite geologist, lives and works in [place]   Elderly parents involved and he helps them   1 brother   Social Drivers of Health  Financial Resource Strain: Low Risk  (08/28/2019)   Overall Financial Resource Strain (CARDIA)    Difficulty of Paying Living Expenses: Not hard at all  Food Insecurity: Food Insecurity Present (04/13/2023)   Hunger Vital Sign    Worried About Running Out of Food in the Last Year: Never true    Ran Out of Food in the Last Year: Sometimes true  Transportation Needs: No Transportation Needs (04/13/2023)   PRAPARE - Administrator, Civil Service (Medical): No    Lack of Transportation (Non-Medical): No  Physical Activity: Insufficiently Active (11/08/2019)   Exercise Vital Sign    Days of Exercise per Week: 3 days    Minutes of Exercise per Session: 30 min  Stress:  Stress Concern Present (08/28/2019)   [employer] of Occupational Health - Occupational Stress Questionnaire    Feeling of Stress : Very much  Social Connections: Socially Isolated (03/28/2023)   Social Connection and Isolation Panel [NHANES]    Frequency of Communication with Friends and Family: Three times a week    Frequency of Social Gatherings with Friends and Family: Twice a week    Attends Religious Services: Never    Database administrator or Organizations: No    Attends Engineer, structural: Never    Marital Status: Divorced   Past Medical History:  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 06/15/2018   Cancer of skin of neck    Cataract    Chronic mesenteric ischemia ([name])    Chronic pain syndrome 07/09/2016   Crohn's disease of small and large intestines ([name])    followed by dr Baldo Ash gessner   Dairy product intolerance    Diarrhea, functional    Family history of adverse reaction to anesthesia    Grover's disease    transient acantholytic dermatosis   History of alcohol abuse    History of basal cell carcinoma excision    2013 left leg   History of Clostridium difficile    10/ 2014   History of multiple concussions    x6   last one Jan 2017 per pt--  no residual   History of substance abuse ([name])    quit 1997 per pt   History of suicide attempt    05-18-2012  overdose /  failure to thrive   Iron deficiency anemia due to chronic blood loss    Major depression, recurrent, chronic ([name])    Osteopenia    Personal history of adenomatous colonic polyps 12/2010, 03/2012   12/2010 - 8 mm serrated adenoma of rectum   Portal vein thrombosis 03/21/2015   right   Primary sclerosing cholangitis    ? hepatitis overlap - liver bx x  2 and MRCP   Seasonal allergies    Steroid-induced diabetes ([name]) 03/15/2022   Substance abuse ([name]) 1997   Alcohol   Vitamin A deficiency 05/23/2018   Vitamin B6 deficiency 07/27/2018   Vitamin C deficiency 05/23/2018    Past Surgical History:  Procedure Laterality Date   ABDOMINAL AORTAGRAM N/A 03/29/2012   Procedure: ABDOMINAL Ronny Flurry;  Surgeon: Nada Libman, MD;  Location: [facility] CATH LAB;  Service: Cardiovascular;  Laterality: N/A;   ADENOIDECTOMY  age 18  BIOPSY  05/31/2018   Procedure: BIOPSY;  Surgeon: Rachael Fee, MD;  Location: [facility] ENDOSCOPY;  Service: Endoscopy;;   CATARACT EXTRACTION W/ INTRAOCULAR LENS  IMPLANT, BILATERAL  2009   COLONOSCOPY  2001, 05/02/2003, 01/28/11   2012: Right colon Crohn's, rectal polyp   COLONOSCOPY  03/31/2012   Procedure: COLONOSCOPY;  Surgeon: Beverley Fiedler, MD;  Location: [facility] ENDOSCOPY;  Service: Gastroenterology;  Laterality: N/A;   COLONOSCOPY WITH PROPOFOL N/A 05/31/2018   Procedure: COLONOSCOPY WITH PROPOFOL;  Surgeon: Rachael Fee, MD;  Location: [facility] ENDOSCOPY;  Service: Endoscopy;  Laterality: N/A;   ESOPHAGOGASTRODUODENOSCOPY  01/28/2011   Normal   FOOT SURGERY Right age 9   MOHS SURGERY Left 11/2013   left ankle parakerotosis    PERCUTANEOUS LIVER BIOPSY  2007 and 2008   PILONIDAL CYST EXCISION  age 69   PLACEMENT OF SETON N/A 11/06/2015   Procedure: PLACEMENT OF SETON;  Surgeon: Romie Levee, MD;  Location: [facility];  Service: General;  Laterality: N/A;   PLACEMENT OF SETON  07/2018   at [facility]   RECTAL EXAM UNDER ANESTHESIA N/A 01/18/2019   Procedure: ANAL EXAM UNDER ANESTHESIA,  INCISION AND DRAINAGE, SETON PLACEMENT;  Surgeon: Romie Levee, MD;  Location: [facility];  Service: General;  Laterality: N/A;   UPPER GASTROINTESTINAL ENDOSCOPY      Current Medications: Current Facility-Administered Medications  Medication Dose Route Frequency Provider Last Rate Last Admin    acetaminophen (TYLENOL) tablet 650 mg  650 mg Oral Q6H PRN Motley-Mangrum, Jadeka A, PMHNP       alum & mag hydroxide-simeth (MAALOX/MYLANTA) 200-200-20 MG/5ML suspension 30 mL  30 mL Oral Q4H PRN Motley-Mangrum, Jadeka A, PMHNP       ARIPiprazole (ABILIFY) tablet 5 mg  5 mg Oral QHS Verner Chol, MD   5 mg at 05/06/23 2100   doxycycline (VIBRA-TABS) tablet 100 mg  100 mg Oral Q12H Floydene Flock, MD   100 mg at 05/07/23 0981   feeding supplement (ENSURE ENLIVE / ENSURE PLUS) liquid 237 mL  1 Bottle Oral TID BM Verner Chol, MD   237 mL at 05/07/23 1355   hydrOXYzine (ATARAX) tablet 25 mg  25 mg Oral TID PRN Motley-Mangrum, Ezra Sites, PMHNP   25 mg at 05/07/23 0308   liver oil-zinc oxide (DESITIN) 40 % ointment   Topical Q12H PRN Verner Chol, MD   Given at 05/04/23 0803   LORazepam (ATIVAN) injection 1 mg  1 mg Intramuscular Q4H PRN Remington, Amber E, NP       LORazepam (ATIVAN) injection 2 mg  2 mg Intramuscular TID PRN Motley-Mangrum, Jadeka A, PMHNP       LORazepam (ATIVAN) tablet 1 mg  1 mg Oral Q4H PRN Remington, Amber E, NP   1 mg at 05/07/23 0935   magnesium hydroxide (MILK OF MAGNESIA) suspension 30 mL  30 mL Oral Daily PRN Motley-Mangrum, Jadeka A, PMHNP       multivitamin with minerals tablet 1 tablet  1 tablet Oral Daily Verner Chol, MD   1 tablet at 05/07/23 0814   neomycin-bacitracin-polymyxin [phone] OINT   Topical Q6H PRN Lewanda Rife, MD   Given at 05/07/23 0814   ondansetron (ZOFRAN) injection 4 mg  4 mg Intravenous Q6H PRN Verner Chol, MD   4 mg at 04/16/23 0905   ondansetron (ZOFRAN-ODT) disintegrating tablet 4 mg  4 mg Oral Q8H PRN Verner Chol, MD       predniSONE (DELTASONE) tablet 30  mg  30 mg Oral Q breakfast Motley-Mangrum, Jadeka A, PMHNP   30 mg at 05/07/23 0814   venlafaxine XR (EFFEXOR-XR) 24 hr capsule 150 mg  150 mg Oral Q breakfast Lewanda Rife, MD   150 mg at 05/07/23 0814        Blood Alcohol level:  Lab Results   Component Value Date   ETH <10 04/13/2023   ETH <10 03/28/2023    Metabolic Disorder Labs: Lab Results  Component Value Date   HGBA1C 4.8 04/03/2023   MPG 91.06 04/03/2023   No results found for: "PROLACTIN" Lab Results  Component Value Date   CHOL 209 (H) 04/03/2023   TRIG 75 04/03/2023   HDL 65 04/03/2023   CHOLHDL 3.2 04/03/2023   VLDL 15 04/03/2023   LDLCALC 129 (H) 04/03/2023    Psychiatric Specialty Exam:  Presentation  General Appearance:  Appropriate for Environment; Casual   Eye Contact: Fair   Speech: Clear and Coherent   Speech Volume: Normal       Mood and Affect  Mood: "Fine"   Affect: Less constricted     Thought Process  Thought Processes: Coherent   Descriptions of Associations:Intact   Orientation:Full (Time, Place and Person)   Thought Content:Logical   Hallucinations:Hallucinations: None   Ideas of Reference:None   Suicidal Thoughts:Suicidal Thoughts: No   Homicidal Thoughts:Homicidal Thoughts: No     Sensorium  Memory: Immediate Fair; Recent Fair; Remote Fair   Judgment: Impaired   Insight: Shallow     Executive Functions  Concentration: Fair   Attention Span: Fair   Recall: [employer] of Knowledge: Fair   Language: Fair     Psychomotor Activity  Psychomotor Activity: Psychomotor Activity: Normal   Musculoskeletal: Strength & Muscle Tone: decreased Gait & Station: Improved, patient walks with the help of a walker Assets  Assets: Manufacturing systems engineer; Desire for Improvement; Resilience   Physical Exam: Physical Exam Vitals and nursing note reviewed.  HENT:     Head: Normocephalic.     Nose: No congestion.     Mouth/Throat:     Mouth: Mucous membranes are moist.  Eyes:     Pupils: Pupils are equal, round, and reactive to light.  Cardiovascular:     Rate and Rhythm: Normal rate.     Pulses: Normal pulses.  Pulmonary:     Breath sounds: Normal breath sounds.  Abdominal:      General: Bowel sounds are normal.  Neurological:     General: No focal deficit present.     Mental Status: He is alert.    Review of Systems  Constitutional:  Positive for malaise/fatigue.  HENT: Negative.    Eyes: Negative.   Cardiovascular: Negative.   Gastrointestinal: Negative.   Skin: Negative.   Blood pressure 111/78, pulse 91, temperature (!) 97.3 F (36.3 C), resp. rate 18, height 6\' 1"  (1.854 m), weight 60.6 kg, SpO2 99%. Body mass index is 17.61 kg/m.  Diagnosis: Principal Problem:   MDD (major depressive disorder), recurrent severe, without psychosis ([name]) Active Problems:   GAD (generalized anxiety disorder)   Tremor  Clinical decision making-patient is currently admitted after a lethal suicide attempt by overdosing on her antidepressants.  Patient is noted to have disabling physical problems including Crohn's disease, chronic pain, physical weakness.  Patient continues to struggle with hopelessness, anhedonia, severe anxiety and panic attacks.  His suicidal ideation have improved to intermittent occurrence and denies any hallucinations.  We continue to adjust the medications to help with  his treatment resistant depression.  He continues to need inpatient psychiatric hospitalization. PLAN: Safety and Monitoring:  -- Voluntary admission to inpatient psychiatric unit for safety, stabilization and treatment  -- Daily contact with patient to assess and evaluate symptoms and progress in treatment  -- Patient's case to be discussed in multi-disciplinary team meeting  -- Observation Level : q15 minute checks  -- Vital signs:  q12 hours  -- Precautions: suicide, elopement, and assault -- Encouraged patient to participate in unit milieu and in scheduled group therapies  2. Psychiatric Diagnoses and Treatment:  Effexor Xr 150 mg daily Continue Abilify 5 mg at bedtime for mood stabilization given the intensity of depression.  Discussed thoroughly the side effects risk benefits of  Abilify and patient gave consent for the medication.    3. Medical Issues Being Addressed:  None today   4. Discharge Planning:   -- Social work and case management to assist with discharge planning and identification of hospital follow-up needs prior to discharge  -- Discharge date to be determined, Child psychotherapist and patient's family is working on home health care services  Verner Chol, MD

## 2023-05-07 NOTE — Progress Notes (Signed)
   05/06/23 2100  Psych Admission Type (Psych Patients Only)  Admission Status Involuntary  Psychosocial Assessment  Patient Complaints Anxiety;Depression  Eye Contact Fair  Facial Expression Sad  Affect Sad  Speech Logical/coherent  Interaction Assertive  Motor Activity Slow  Appearance/Hygiene In scrubs  Behavior Characteristics Fidgety  Aggressive Behavior  Effect No apparent injury  Thought Process  Coherency WDL  Content WDL  Delusions None reported or observed  Perception WDL  Hallucination None reported or observed  Judgment Limited  Confusion None  Danger to Self  Current suicidal ideation? Denies  Self-Injurious Behavior No self-injurious ideation or behavior indicators observed or expressed   Agreement Not to Harm Self Yes  Description of Agreement Verbal  Danger to Others  Danger to Others None reported or observed

## 2023-05-07 NOTE — Plan of Care (Signed)
 Pt denies SI/HI/AVH and pain, but c/o anxiety and depression. Pt is pleasant and cooperative. Pt was offered support and encouragement. Pt was given scheduled medications and PRN meds requested. Pt was encourage to attend groups, and attended evening wrap up group after snack. Q 15 minute safety checks maintained/ongoing. Pt receptive to treatment and safety maintained on unit.   Problem: Education: Goal: Knowledge of [facility] Education information/materials will improve Outcome: Progressing Goal: Emotional status will improve Outcome: Progressing Goal: Mental status will improve Outcome: Progressing Goal: Verbalization of understanding the information provided will improve Outcome: Progressing   Problem: Activity: Goal: Interest or engagement in activities will improve Outcome: Progressing Goal: Sleeping patterns will improve Outcome: Progressing   Problem: Coping: Goal: Ability to verbalize frustrations and anger appropriately will improve Outcome: Progressing Goal: Ability to demonstrate self-control will improve Outcome: Progressing   Problem: Health Behavior/Discharge Planning: Goal: Identification of resources available to assist in meeting health care needs will improve Outcome: Progressing Goal: Compliance with treatment plan for underlying cause of condition will improve Outcome: Progressing

## 2023-05-07 NOTE — Group Note (Signed)
 Date:  05/07/2023 Time:  9:15 PM  Group Topic/Focus:  Healthy Communication:   The focus of this group is to discuss communication, barriers to communication, as well as healthy ways to communicate with others.    Participation Level:  Did Not Attend  Participation Quality:   none  Affect:   none  Cognitive:   none  Insight: None  Engagement in Group:   none  Modes of Intervention:   none  Additional Comments:  none   Delia Sitar 05/07/2023, 9:15 PM

## 2023-05-07 NOTE — Progress Notes (Signed)
   05/07/23 0645  15 Minute Checks  Location Bedroom  Visual Appearance Calm  Behavior Sleeping  Sleep (Behavioral Health Patients Only)  Calculate sleep? (Click Yes once per 24 hr at 0600 safety check) Yes  Documented sleep last 24 hours 8.75

## 2023-05-07 NOTE — [facility] Group Notes (Signed)
 LCSW Group Therapy Note   05/07/2023 1:15pm   Type of Therapy and Topic:  Group Therapy:  Overcoming Obstacles   Participation Level:  Did Not Attend   Description of Group:    In this group patients will be encouraged to explore what they see as obstacles to their own wellness and recovery. They will be guided to discuss their thoughts, feelings, and behaviors related to these obstacles. The group will process together ways to cope with barriers, with attention given to specific choices patients can make. Each patient will be challenged to identify changes they are motivated to make in order to overcome their obstacles. This group will be process-oriented, with patients participating in exploration of their own experiences as well as giving and receiving support and challenge from other group members.   Therapeutic Goals: Patient will identify personal and current obstacles as they relate to admission. Patient will identify barriers that currently interfere with their wellness or overcoming obstacles.  Patient will identify feelings, thought process and behaviors related to these barriers. Patient will identify two changes they are willing to make to overcome these obstacles:      Summary of Patient Progress      Therapeutic Modalities:   Cognitive Behavioral Therapy Solution Focused Therapy Motivational Interviewing Relapse Prevention Therapy  Lorri Frederick, LCSW 05/07/2023 2:38 PM

## 2023-05-08 DIAGNOSIS — F332 Major depressive disorder, recurrent severe without psychotic features: Secondary | ICD-10-CM | POA: Diagnosis not present

## 2023-05-08 NOTE — Plan of Care (Signed)
  Problem: Coping: Goal: Ability to verbalize frustrations and anger appropriately will improve Outcome: Progressing Goal: Ability to demonstrate self-control will improve Outcome: Progressing   Problem: Health Behavior/Discharge Planning: Goal: Identification of resources available to assist in meeting health care needs will improve Outcome: Progressing Goal: Compliance with treatment plan for underlying cause of condition will improve Outcome: Progressing

## 2023-05-08 NOTE — Plan of Care (Signed)
   Problem: Education: Goal: Emotional status will improve Outcome: Progressing Goal: Mental status will improve Outcome: Progressing

## 2023-05-08 NOTE — Progress Notes (Signed)
   05/08/23 1100  Psych Admission Type (Psych Patients Only)  Admission Status Involuntary  Psychosocial Assessment  Patient Complaints Anxiety;Depression  Eye Contact Fair  Facial Expression Flat;Sad  Affect Appropriate to circumstance  Speech Logical/coherent  Interaction Minimal  Motor Activity Slow  Appearance/Hygiene In scrubs  Behavior Characteristics Cooperative  Mood Pleasant  Thought Process  Coherency WDL  Content WDL  Delusions WDL  Perception WDL  Hallucination None reported or observed  Judgment Limited  Confusion None  Danger to Self  Current suicidal ideation? Denies  Agreement Not to Harm Self Yes  Description of Agreement verbal  Danger to Others  Danger to Others None reported or observed

## 2023-05-08 NOTE — Group Note (Signed)
 Date:  05/08/2023 Time:  10:38 PM  Group Topic/Focus:  Wrap-Up Group:   The focus of this group is to help patients review their daily goal of treatment and discuss progress on daily workbooks.    Participation Level:  Active  Participation Quality:  Appropriate  Affect:  Appropriate  Cognitive:  Appropriate  Insight: Appropriate  Engagement in Group:  Engaged  Modes of Intervention:  Discussion  Additional Comments:    Maeola Harman 05/08/2023, 10:38 PM

## 2023-05-08 NOTE — [facility] Group Notes (Signed)
 LCSW Wellness Group Note   05/08/2023 1:00pm  Type of Group and Topic: Psychoeducational Group:  Wellness  Participation Level:  Active  Description of Group  Wellness group introduces the topic and its focus on developing healthy habits across the spectrum and its relationship to a decrease in hospital admissions.  Six areas of wellness are discussed: physical, social spiritual, intellectual, occupational, and emotional.  Patients are asked to consider their current wellness habits and to identify areas of wellness where they are interested and able to focus on improvements.    Therapeutic Goals Patients will understand components of wellness and how they can positively impact overall health.  Patients will identify areas of wellness where they have developed good habits. Patients will identify areas of wellness where they would like to make improvements.    Summary of Patient Progress: pt active and appropriate during group, read several wellness areas out loud to the group.  Pt identified financial wellness as an area that needs work and emotional wellness as an area that is doing a little bit better.       Therapeutic Modalities: Cognitive Behavioral Therapy Psychoeducation    Lorri Frederick, LCSW

## 2023-05-08 NOTE — Progress Notes (Signed)
 [facility] MD Progress Note  05/08/2023  [name]  MRN:  [id number]  Cadan Maggart is a 58 year old Caucasian male with a history of MDD, anxiety, Crohn's disease, and chronic mesenteric ischemia who presented to [facility] emergency department on 04/13/23 at 0404 for suicide attempt by ingestion of 28 tablets of 10mg  Lexapro at 0200. Per ED documentation, patient had some mild symptoms of serotonin syndrome on arrival, including acute agitation and clonus, with symptomatic improvement with benzodiazepines. Did have a return of symptoms requiring additional dose of lorazepam. Poison control advised the total amount of ingested Lexapro likely is not enough to cause serotonin syndrome by itself and they recommended 8-hour cardiac monitoring. Qtc 498.  Subjective:  Chart reviewed, case discussed in multidisciplinary meeting, patient seen during rounds.  Patient is noted to be resting in his bed.  He offers no complaints.  He reports having a better day.  He continues to rate his depression as 7 out of 10, 10 being the worst and anxiety as 7 out of 10.  He denies having any panic attacks.  He denies current SI/HI/plan.  He denies auditory/visual hallucinations.  He did not have any bad dreams last night.  He reports that he is waiting for his brother call today.  Per nursing no other concerns were reported  Appetite:  Fair  Past Psychiatric History: see h&P Family History:  Family History  Problem Relation Age of Onset   Heart disease Father    Thyroid cancer Father    Allergies Father    Clotting disorder Father    Breast cancer Mother    Stomach cancer Mother    Colon cancer Neg Hx    Esophageal cancer Neg Hx    Rectal cancer Neg Hx    Social History:  Social History   Substance and Sexual Activity  Alcohol Use No   Alcohol/week: 0.0 standard drinks of alcohol     Social History   Substance and Sexual Activity  Drug Use Not Currently   Types: Cocaine, Marijuana   Comment:  QUIT USING DRUGS IN 1997    Social History   Socioeconomic History   Marital status: Single    Spouse name: Not on file   Number of children: 1   Years of education: Not on file   Highest education level: Not on file  Occupational History   Occupation: Disability   Tobacco Use   Smoking status: Former    Current packs/day: 0.00    Average packs/day: 1 pack/day for 15.0 years (15.0 ttl pk-yrs)    Types: Cigarettes    Start date: 03/28/1982    Quit date: 03/28/1997    Years since quitting: 26.1   Smokeless tobacco: Never  Vaping Use   Vaping status: Never Used  Substance and Sexual Activity   Alcohol use: No    Alcohol/week: 0.0 standard drinks of alcohol   Drug use: Not Currently    Types: Cocaine, Marijuana    Comment: QUIT USING DRUGS IN 1997   Sexual activity: Not Currently  Other Topics Concern   Not on file  Social History Narrative   Single, history of substance abuse in recovery   Previously occupied on medical disability   1 child  Daughter Wellsite geologist, lives and works in [place]   Elderly parents involved and he helps them   1 brother   Social Drivers of Corporate investment banker Strain: Low Risk  (08/28/2019)   Overall Financial Resource Strain (CARDIA)  Difficulty of Paying Living Expenses: Not hard at all  Food Insecurity: Food Insecurity Present (04/13/2023)   Hunger Vital Sign    Worried About Running Out of Food in the Last Year: Never true    Ran Out of Food in the Last Year: Sometimes true  Transportation Needs: No Transportation Needs (04/13/2023)   PRAPARE - Administrator, Civil Service (Medical): No    Lack of Transportation (Non-Medical): No  Physical Activity: Insufficiently Active (11/08/2019)   Exercise Vital Sign    Days of Exercise per Week: 3 days    Minutes of Exercise per Session: 30 min  Stress: Stress Concern Present (08/28/2019)   [employer] of Occupational Health - Occupational Stress Questionnaire    Feeling of  Stress : Very much  Social Connections: Socially Isolated (03/28/2023)   Social Connection and Isolation Panel [NHANES]    Frequency of Communication with Friends and Family: Three times a week    Frequency of Social Gatherings with Friends and Family: Twice a week    Attends Religious Services: Never    Database administrator or Organizations: No    Attends Engineer, structural: Never    Marital Status: Divorced   Past Medical History:  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 06/15/2018   Cancer of skin of neck    Cataract    Chronic mesenteric ischemia ([name])    Chronic pain syndrome 07/09/2016   Crohn's disease of small and large intestines ([name])    followed by dr Baldo Ash gessner   Dairy product intolerance    Diarrhea, functional    Family history of adverse reaction to anesthesia    Grover's disease    transient acantholytic dermatosis   History of alcohol abuse    History of basal cell carcinoma excision    2013 left leg   History of Clostridium difficile    10/ 2014   History of multiple concussions    x6   last one Jan 2017 per pt--  no residual   History of substance abuse ([name])    quit 1997 per pt   History of suicide attempt    05-18-2012  overdose /  failure to thrive   Iron deficiency anemia due to chronic blood loss    Major depression, recurrent, chronic ([name])    Osteopenia    Personal history of adenomatous colonic polyps 12/2010, 03/2012   12/2010 - 8 mm serrated adenoma of rectum   Portal vein thrombosis 03/21/2015   right   Primary sclerosing cholangitis    ? 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   Substance abuse ([name]) 1997   Alcohol   Vitamin  A deficiency 05/23/2018   Vitamin B6 deficiency 07/27/2018   Vitamin C deficiency 05/23/2018    Past Surgical History:  Procedure Laterality Date   ABDOMINAL AORTAGRAM N/A 03/29/2012   Procedure: ABDOMINAL Ronny Flurry;  Surgeon: Nada Libman, MD;  Location: [facility] CATH LAB;  Service: Cardiovascular;  Laterality: N/A;   ADENOIDECTOMY  age 36   BIOPSY  05/31/2018   Procedure: BIOPSY;  Surgeon: Rachael Fee, MD;  Location:  [facility];  Service: Endoscopy;;   CATARACT EXTRACTION W/ INTRAOCULAR LENS  IMPLANT, BILATERAL  2009   COLONOSCOPY  2001, 05/02/2003, 01/28/11   2012: Right colon Crohn's, rectal polyp   COLONOSCOPY  03/31/2012   Procedure: COLONOSCOPY;  Surgeon: Beverley Fiedler, MD;  Location[facility];  Service: Gastroenterology;  Laterality: N/A;   COLONOSCOPY WITH PROPOFOL N/A 05/31/2018   Procedure: COLONOSCOPY WITH PROPOFOL;  Surgeon: Rachael Fee, MD;  Location: [facility];  Service: Endoscopy;  Laterality: N/A;   ESOPHAGOGASTRODUODENOSCOPY  01/28/2011   Normal   FOOT SURGERY Right age 66   MOHS SURGERY Left 11/2013   left ankle parakerotosis    PERCUTANEOUS LIVER BIOPSY  2007 and 2008   PILONIDAL CYST EXCISION  age 26   PLACEMENT OF SETON N/A 11/06/2015   Procedure: PLACEMENT OF SETON;  Surgeon: Romie Levee, MD;  Location: [facility];  Service: General;  Laterality: N/A;   PLACEMENT OF SETON  07/2018   at [facility]   RECTAL EXAM UNDER ANESTHESIA N/A 01/18/2019   Procedure: ANAL EXAM UNDER ANESTHESIA,  INCISION AND DRAINAGE, SETON PLACEMENT;  Surgeon: Romie Levee, MD;  Location: [facility];  Service: General;  Laterality: N/A;   UPPER GASTROINTESTINAL ENDOSCOPY      Current Medications: Current Facility-Administered Medications  Medication Dose Route Frequency Provider Last Rate Last Admin   acetaminophen (TYLENOL) tablet 650 mg  650 mg Oral Q6H PRN Motley-Mangrum, Jadeka A, PMHNP       alum & mag hydroxide-simeth (MAALOX/MYLANTA)  200-200-20 MG/5ML suspension 30 mL  30 mL Oral Q4H PRN Motley-Mangrum, Jadeka A, PMHNP       ARIPiprazole (ABILIFY) tablet 5 mg  5 mg Oral QHS Verner Chol, MD   5 mg at 05/07/23 2141   doxycycline (VIBRA-TABS) tablet 100 mg  100 mg Oral Q12H Floydene Flock, MD   100 mg at 05/08/23 0834   feeding supplement (ENSURE ENLIVE / ENSURE PLUS) liquid 237 mL  1 Bottle Oral TID BM Verner Chol, MD   237 mL at 05/07/23 2050   hydrOXYzine (ATARAX) tablet 25 mg  25 mg Oral TID PRN Motley-Mangrum, Ezra Sites, PMHNP   25 mg at 05/07/23 2141   liver oil-zinc oxide (DESITIN) 40 % ointment   Topical Q12H PRN Verner Chol, MD   Given at 05/04/23 0803   LORazepam (ATIVAN) injection 1 mg  1 mg Intramuscular Q4H PRN Remington, Amber E, NP       LORazepam (ATIVAN) injection 2 mg  2 mg Intramuscular TID PRN Motley-Mangrum, Jadeka A, PMHNP       LORazepam (ATIVAN) tablet 1 mg  1 mg Oral Q4H PRN Remington, Amber E, NP   1 mg at 05/08/23 0845   magnesium hydroxide (MILK OF MAGNESIA) suspension 30 mL  30 mL Oral Daily PRN Motley-Mangrum, Jadeka A, PMHNP       multivitamin with minerals tablet 1 tablet  1 tablet Oral Daily Verner Chol, MD   1 tablet at 05/08/23 0834   neomycin-bacitracin-polymyxin [phone] OINT   Topical Q6H PRN Lewanda Rife, MD   1 Application at 05/08/23 0833   ondansetron (ZOFRAN) injection 4 mg  4 mg Intravenous Q6H PRN Verner Chol, MD   4 mg at 04/16/23 0905   ondansetron (ZOFRAN-ODT) disintegrating tablet 4 mg  4 mg Oral Q8H PRN Verner Chol, MD       predniSONE (DELTASONE) tablet 30 mg  30 mg Oral Q breakfast Motley-Mangrum, Ezra Sites, PMHNP   30  mg at 05/08/23 0834   venlafaxine XR (EFFEXOR-XR) 24 hr capsule 150 mg  150 mg Oral Q breakfast Lewanda Rife, MD   150 mg at 05/08/23 0834        Blood Alcohol level:  Lab Results  Component Value Date   ETH <10 04/13/2023   ETH <10 03/28/2023    Metabolic Disorder Labs: Lab Results  Component Value Date   HGBA1C  4.8 04/03/2023   MPG 91.06 04/03/2023   No results found for: "PROLACTIN" Lab Results  Component Value Date   CHOL 209 (H) 04/03/2023   TRIG 75 04/03/2023   HDL 65 04/03/2023   CHOLHDL 3.2 04/03/2023   VLDL 15 04/03/2023   LDLCALC 129 (H) 04/03/2023    Psychiatric Specialty Exam:  Presentation  General Appearance:  Appropriate for Environment; Casual   Eye Contact: Fair   Speech: Clear and Coherent   Speech Volume: Normal       Mood and Affect  Mood: "Fine"   Affect: Less constricted     Thought Process  Thought Processes: Coherent   Descriptions of Associations:Intact   Orientation:Full (Time, Place and Person)   Thought Content:Logical   Hallucinations:Hallucinations: None   Ideas of Reference:None   Suicidal Thoughts:Suicidal Thoughts: No   Homicidal Thoughts:Homicidal Thoughts: No     Sensorium  Memory: Immediate Fair; Recent Fair; Remote Fair   Judgment: Impaired   Insight: Shallow     Executive Functions  Concentration: Fair   Attention Span: Fair   Recall: [employer] of Knowledge: Fair   Language: Fair     Psychomotor Activity  Psychomotor Activity: Psychomotor Activity: Normal   Musculoskeletal: Strength & Muscle Tone: decreased Gait & Station: Improved, patient walks with the help of a walker Assets  Assets: Manufacturing systems engineer; Desire for Improvement; Resilience   Physical Exam: Physical Exam Vitals and nursing note reviewed.  HENT:     Head: Normocephalic.     Nose: No congestion.     Mouth/Throat:     Mouth: Mucous membranes are moist.  Eyes:     Pupils: Pupils are equal, round, and reactive to light.  Cardiovascular:     Rate and Rhythm: Normal rate.     Pulses: Normal pulses.  Pulmonary:     Breath sounds: Normal breath sounds.  Abdominal:     General: Bowel sounds are normal.  Neurological:     General: No focal deficit present.     Mental Status: He is alert.    Review of Systems   Constitutional:  Positive for malaise/fatigue.  HENT: Negative.    Eyes: Negative.   Cardiovascular: Negative.   Gastrointestinal: Negative.   Skin: Negative.   Blood pressure 113/72, pulse 83, temperature 98.1 F (36.7 C), resp. rate 18, height 6\' 1"  (1.854 m), weight 60.6 kg, SpO2 98%. Body mass index is 17.61 kg/m.  Diagnosis: Principal Problem:   MDD (major depressive disorder), recurrent severe, without psychosis ([name]) Active Problems:   GAD (generalized anxiety disorder)   Tremor  Clinical decision making-patient is currently admitted after a lethal suicide attempt by overdosing on her antidepressants.  Patient is noted to have disabling physical problems including Crohn's disease, chronic pain, physical weakness.  Patient continues to struggle with hopelessness, anhedonia, severe anxiety and panic attacks.  His suicidal ideation have improved to intermittent occurrence and denies any hallucinations.  We continue to adjust the medications to help with his treatment resistant depression.  He continues to need inpatient psychiatric hospitalization. PLAN: Safety and  Monitoring:  -- Voluntary admission to inpatient psychiatric unit for safety, stabilization and treatment  -- Daily contact with patient to assess and evaluate symptoms and progress in treatment  -- Patient's case to be discussed in multi-disciplinary team meeting  -- Observation Level : q15 minute checks  -- Vital signs:  q12 hours  -- Precautions: suicide, elopement, and assault -- Encouraged patient to participate in unit milieu and in scheduled group therapies  2. Psychiatric Diagnoses and Treatment:  Effexor Xr 150 mg daily Continue Abilify 5 mg at bedtime for mood stabilization given the intensity of depression.  Discussed thoroughly the side effects risk benefits of Abilify and patient gave consent for the medication.    3. Medical Issues Being Addressed:  None today   4. Discharge Planning:   -- Social work  and case management to assist with discharge planning and identification of hospital follow-up needs prior to discharge  -- Discharge date to be determined, Child psychotherapist and patient's family is working on home health care services  Verner Chol, MD

## 2023-05-09 DIAGNOSIS — F332 Major depressive disorder, recurrent severe without psychotic features: Secondary | ICD-10-CM | POA: Diagnosis not present

## 2023-05-09 NOTE — Progress Notes (Deleted)
 Third attempt to receive an ETA on the STAT lab at 1747. Response is that the tech has left the floor and should be on the way.

## 2023-05-09 NOTE — Progress Notes (Signed)
 [facility] MD Progress Note  05/09/2023  [name]  MRN:  [id number]  [name] is a 58 year old Caucasian male with a history of MDD, anxiety, Crohn's disease, and chronic mesenteric ischemia who presented to [facility] emergency department on 04/13/23 at 0404 for suicide attempt by ingestion of 28 tablets of 10mg  Lexapro at 0200. Per ED documentation, patient had some mild symptoms of serotonin syndrome on arrival, including acute agitation and clonus, with symptomatic improvement with benzodiazepines. Did have a return of symptoms requiring additional dose of lorazepam. Poison control advised the total amount of ingested Lexapro likely is not enough to cause serotonin syndrome by itself and they recommended 8-hour cardiac monitoring. Qtc 498.  Subjective:  Chart reviewed, case discussed in multidisciplinary meeting, patient seen during rounds.  Today on interview patient was working with the occupational therapy.  He did engage in the interview.  He reports that he had poor sleep as his next-door peer decided to pack up things and make noises around 1 AM last night.  He consistently denies suicidal/homicidal ideation/intent/plan.  He acknowledged that his brother is helping him in finding a place in [place].  He rates his depression 8 out of 10, 10 being the worst and anxiety as 7 out of 10 which has been his chronic depression and anxiety.  He denies auditory/visual hallucinations.  Appetite:  Fair  Past Psychiatric History: see h&P Family History:  Family History  Problem Relation Age of Onset   Heart disease Father    Thyroid cancer Father    Allergies Father    Clotting disorder Father    Breast cancer Mother    Stomach cancer Mother    Colon cancer Neg Hx    Esophageal cancer Neg Hx    Rectal cancer Neg Hx    Social History:  Social History   Substance and Sexual Activity  Alcohol Use No   Alcohol/week: 0.0 standard drinks of alcohol     Social History    Substance and Sexual Activity  Drug Use Not Currently   Types: Cocaine, Marijuana   Comment: QUIT USING DRUGS IN 1997    Social History   Socioeconomic History   Marital status: Single    Spouse name: Not on file   Number of children: 1   Years of education: Not on file   Highest education level: Not on file  Occupational History   Occupation: Disability   Tobacco Use   Smoking status: Former    Current packs/day: 0.00    Average packs/day: 1 pack/day for 15.0 years (15.0 ttl pk-yrs)    Types: Cigarettes    Start date: 03/28/1982    Quit date: 03/28/1997    Years since quitting: 26.1   Smokeless tobacco: Never  Vaping Use   Vaping status: Never Used  Substance and Sexual Activity   Alcohol use: No    Alcohol/week: 0.0 standard drinks of alcohol   Drug use: Not Currently    Types: Cocaine, Marijuana    Comment: QUIT USING DRUGS IN 1997   Sexual activity: Not Currently  Other Topics Concern   Not on file  Social History Narrative   Single, history of substance abuse in recovery   Previously occupied on medical disability   1 child  Daughter Wellsite geologist, lives and works in [place]   Elderly parents involved and he helps them   1 brother   Social Drivers of Corporate investment banker Strain: Low Risk  (08/28/2019)   Overall Financial  Resource Strain (CARDIA)    Difficulty of Paying Living Expenses: Not hard at all  Food Insecurity: Food Insecurity Present (04/13/2023)   Hunger Vital Sign    Worried About Running Out of Food in the Last Year: Never true    Ran Out of Food in the Last Year: Sometimes true  Transportation Needs: No Transportation Needs (04/13/2023)   PRAPARE - Administrator, Civil Service (Medical): No    Lack of Transportation (Non-Medical): No  Physical Activity: Insufficiently Active (11/08/2019)   Exercise Vital Sign    Days of Exercise per Week: 3 days    Minutes of Exercise per Session: 30 min  Stress: Stress Concern Present  (08/28/2019)   [employer] of Occupational Health - Occupational Stress Questionnaire    Feeling of Stress : Very much  Social Connections: Socially Isolated (03/28/2023)   Social Connection and Isolation Panel [NHANES]    Frequency of Communication with Friends and Family: Three times a week    Frequency of Social Gatherings with Friends and Family: Twice a week    Attends Religious Services: Never    Database administrator or Organizations: No    Attends Engineer, structural: Never    Marital Status: Divorced   Past Medical History:  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 06/15/2018   Cancer of skin of neck    Cataract    Chronic mesenteric ischemia ([name])    Chronic pain syndrome 07/09/2016   Crohn's disease of small and large intestines ([name])    followed by dr Baldo Ash gessner   Dairy product intolerance    Diarrhea, functional    Family history of adverse reaction to anesthesia    Grover's disease    transient acantholytic dermatosis   History of alcohol abuse    History of basal cell carcinoma excision    2013 left leg   History of Clostridium difficile    10/ 2014   History of multiple concussions    x6   last one Jan 2017 per pt--  no residual   History of substance abuse ([name])    quit 1997 per pt   History of suicide attempt    05-18-2012  overdose /  failure to thrive   Iron deficiency anemia due to chronic blood loss    Major depression, recurrent, chronic ([name])    Osteopenia    Personal history of adenomatous colonic polyps 12/2010, 03/2012   12/2010 - 8 mm serrated adenoma of rectum   Portal vein thrombosis 03/21/2015   right   Primary sclerosing cholangitis    ? 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   Substance abuse ([name]) 1997   Alcohol   Vitamin A deficiency 05/23/2018   Vitamin B6 deficiency 07/27/2018   Vitamin C deficiency 05/23/2018    Past Surgical History:  Procedure Laterality Date   ABDOMINAL AORTAGRAM N/A 03/29/2012   Procedure: ABDOMINAL Ronny Flurry;  Surgeon: Nada Libman, MD;  Location: [facility] CATH LAB;  Service: Cardiovascular;  Laterality: N/A;   ADENOIDECTOMY  age 63   BIOPSY  05/31/2018   Procedure: BIOPSY;  Surgeon:  Rachael Fee, MD;  Location: [facility] ENDOSCOPY;  Service: Endoscopy;;   CATARACT EXTRACTION W/ INTRAOCULAR LENS  IMPLANT, BILATERAL  2009   COLONOSCOPY  2001, 05/02/2003, 01/28/11   2012: Right colon Crohn's, rectal polyp   COLONOSCOPY  03/31/2012   Procedure: COLONOSCOPY;  Surgeon: Beverley Fiedler, MD;  Location: [facility] ENDOSCOPY;  Service: Gastroenterology;  Laterality: N/A;   COLONOSCOPY WITH PROPOFOL N/A 05/31/2018   Procedure: COLONOSCOPY WITH PROPOFOL;  Surgeon: Rachael Fee, MD;  Location: [facility] ENDOSCOPY;  Service: Endoscopy;  Laterality: N/A;   ESOPHAGOGASTRODUODENOSCOPY  01/28/2011   Normal   FOOT SURGERY Right age 56   MOHS SURGERY Left 11/2013   left ankle parakerotosis    PERCUTANEOUS LIVER BIOPSY  2007 and 2008   PILONIDAL CYST EXCISION  age 1   PLACEMENT OF SETON N/A 11/06/2015   Procedure: PLACEMENT OF SETON;  Surgeon: Romie Levee, MD;  Location: [facility];  Service: General;  Laterality: N/A;   PLACEMENT OF SETON  07/2018   at [facility]   RECTAL EXAM UNDER ANESTHESIA N/A 01/18/2019   Procedure: ANAL EXAM UNDER ANESTHESIA,  INCISION AND DRAINAGE, SETON PLACEMENT;  Surgeon: Romie Levee, MD;  Location: [facility];  Service: General;  Laterality: N/A;   UPPER GASTROINTESTINAL ENDOSCOPY      Current Medications: Current Facility-Administered Medications  Medication Dose Route Frequency Provider Last Rate Last Admin   acetaminophen (TYLENOL) tablet 650  mg  650 mg Oral Q6H PRN Motley-Mangrum, Jadeka A, PMHNP       alum & mag hydroxide-simeth (MAALOX/MYLANTA) 200-200-20 MG/5ML suspension 30 mL  30 mL Oral Q4H PRN Motley-Mangrum, Jadeka A, PMHNP       ARIPiprazole (ABILIFY) tablet 5 mg  5 mg Oral QHS Verner Chol, MD   5 mg at 05/08/23 2130   doxycycline (VIBRA-TABS) tablet 100 mg  100 mg Oral Q12H Floydene Flock, MD   100 mg at 05/09/23 0803   feeding supplement (ENSURE ENLIVE / ENSURE PLUS) liquid 237 mL  1 Bottle Oral TID BM Verner Chol, MD   237 mL at 05/08/23 2050   hydrOXYzine (ATARAX) tablet 25 mg  25 mg Oral TID PRN Motley-Mangrum, Ezra Sites, PMHNP   25 mg at 05/08/23 2130   liver oil-zinc oxide (DESITIN) 40 % ointment   Topical Q12H PRN Verner Chol, MD   Given at 05/04/23 0803   LORazepam (ATIVAN) injection 1 mg  1 mg Intramuscular Q4H PRN Remington, Amber E, NP       LORazepam (ATIVAN) injection 2 mg  2 mg Intramuscular TID PRN Motley-Mangrum, Jadeka A, PMHNP       LORazepam (ATIVAN) tablet 1 mg  1 mg Oral Q4H PRN Remington, Amber E, NP   1 mg at 05/09/23 0450   magnesium hydroxide (MILK OF MAGNESIA) suspension 30 mL  30 mL Oral Daily PRN Motley-Mangrum, Jadeka A, PMHNP       multivitamin with minerals tablet 1 tablet  1 tablet Oral Daily Verner Chol, MD   1 tablet at 05/09/23 0803   neomycin-bacitracin-polymyxin [phone] OINT   Topical Q6H PRN Lewanda Rife, MD   1 Application at 05/09/23 0752   ondansetron (ZOFRAN) injection 4 mg  4 mg Intravenous Q6H PRN Verner Chol, MD   4 mg at 04/16/23 0905   ondansetron (ZOFRAN-ODT) disintegrating tablet 4 mg  4 mg Oral Q8H PRN Verner Chol, MD       predniSONE (DELTASONE) tablet 30 mg  30 mg Oral Q breakfast Motley-Mangrum,  Ezra Sites, PMHNP   30 mg at 05/09/23 0803   venlafaxine XR (EFFEXOR-XR) 24 hr capsule 150 mg  150 mg Oral Q breakfast Lewanda Rife, MD   150 mg at 05/09/23 0803        Blood Alcohol level:  Lab Results  Component Value Date   ETH <10  04/13/2023   ETH <10 03/28/2023    Metabolic Disorder Labs: Lab Results  Component Value Date   HGBA1C 4.8 04/03/2023   MPG 91.06 04/03/2023   No results found for: "PROLACTIN" Lab Results  Component Value Date   CHOL 209 (H) 04/03/2023   TRIG 75 04/03/2023   HDL 65 04/03/2023   CHOLHDL 3.2 04/03/2023   VLDL 15 04/03/2023   LDLCALC 129 (H) 04/03/2023    Psychiatric Specialty Exam:  Presentation  General Appearance:  Appropriate for Environment; Casual   Eye Contact: Fair   Speech: Clear and Coherent   Speech Volume: Normal       Mood and Affect  Mood: "Fine"   Affect: Less constricted     Thought Process  Thought Processes: Coherent   Descriptions of Associations:Intact   Orientation:Full (Time, Place and Person)   Thought Content:Logical   Hallucinations:Hallucinations: None   Ideas of Reference:None   Suicidal Thoughts:Suicidal Thoughts: No   Homicidal Thoughts:Homicidal Thoughts: No     Sensorium  Memory: Immediate Fair; Recent Fair; Remote Fair   Judgment: Impaired   Insight: Shallow     Executive Functions  Concentration: Fair   Attention Span: Fair   Recall: [employer] of Knowledge: Fair   Language: Fair     Psychomotor Activity  Psychomotor Activity: Psychomotor Activity: Normal   Musculoskeletal: Strength & Muscle Tone: decreased Gait & Station: Improved, patient walks with the help of a walker Assets  Assets: Manufacturing systems engineer; Desire for Improvement; Resilience   Physical Exam: Physical Exam Vitals and nursing note reviewed.  HENT:     Head: Normocephalic.     Nose: No congestion.     Mouth/Throat:     Mouth: Mucous membranes are moist.  Eyes:     Pupils: Pupils are equal, round, and reactive to light.  Cardiovascular:     Rate and Rhythm: Normal rate.     Pulses: Normal pulses.  Pulmonary:     Breath sounds: Normal breath sounds.  Abdominal:     General: Bowel sounds are normal.   Neurological:     General: No focal deficit present.     Mental Status: He is alert.    Review of Systems  Constitutional:  Positive for malaise/fatigue.  HENT: Negative.    Eyes: Negative.   Cardiovascular: Negative.   Gastrointestinal: Negative.   Skin: Negative.   Blood pressure 96/69, pulse 74, temperature 98.7 F (37.1 C), resp. rate 18, height 6\' 1"  (1.854 m), weight 60.6 kg, SpO2 99%. Body mass index is 17.61 kg/m.  Diagnosis: Principal Problem:   MDD (major depressive disorder), recurrent severe, without psychosis ([name]) Active Problems:   GAD (generalized anxiety disorder)   Tremor  Clinical decision making-patient is currently admitted after a lethal suicide attempt by overdosing on her antidepressants.  Patient is noted to have disabling physical problems including Crohn's disease, chronic pain, physical weakness.  Patient continues to struggle with hopelessness, anhedonia, severe anxiety and panic attacks.  His suicidal ideation have improved to intermittent occurrence and denies any hallucinations.  We continue to adjust the medications to help with his treatment resistant depression.  He continues to need  inpatient psychiatric hospitalization. PLAN: Safety and Monitoring:  -- Involuntary admission to inpatient psychiatric unit for safety, stabilization and treatment  -- Daily contact with patient to assess and evaluate symptoms and progress in treatment  -- Patient's case to be discussed in multi-disciplinary team meeting  -- Observation Level : q15 minute checks  -- Vital signs:  q12 hours  -- Precautions: suicide, elopement, and assault -- Encouraged patient to participate in unit milieu and in scheduled group therapies  2. Psychiatric Diagnoses and Treatment:  Effexor Xr 150 mg daily Continue Abilify 5 mg at bedtime for mood stabilization given the intensity of depression.  Discussed thoroughly the side effects risk benefits of Abilify and patient gave consent for  the medication.    3. Medical Issues Being Addressed:  None today   4. Discharge Planning:   -- Social work and case management to assist with discharge planning and identification of hospital follow-up needs prior to discharge  -- Discharge date to be determined, Child psychotherapist and patient's family is working on home health care services  Verner Chol, MD

## 2023-05-09 NOTE — Progress Notes (Signed)
   05/09/23 1700  Psych Admission Type (Psych Patients Only)  Admission Status Involuntary  Psychosocial Assessment  Patient Complaints Anxiety  Eye Contact Fair  Facial Expression Flat;Sad  Affect Appropriate to circumstance  Speech Logical/coherent  Interaction Minimal  Motor Activity Slow  Appearance/Hygiene In scrubs  Behavior Characteristics Cooperative  Mood Pleasant  Thought Process  Coherency WDL  Content WDL  Delusions WDL  Perception WDL  Hallucination None reported or observed  Judgment Limited  Confusion None  Danger to Self  Current suicidal ideation? Denies  Agreement Not to Harm Self Yes  Description of Agreement verbal  Danger to Others  Danger to Others None reported or observed

## 2023-05-09 NOTE — Plan of Care (Signed)
   Problem: Education: Goal: Knowledge of [facility] Education information/materials will improve Outcome: Progressing Goal: Emotional status will improve Outcome: Progressing Goal: Mental status will improve Outcome: Progressing Goal: Verbalization of understanding the information provided will improve Outcome: Progressing

## 2023-05-09 NOTE — Evaluation (Signed)
 Occupational Therapy Re-Evaluation Patient Details Name: [name] MRN: [id number] DOB: 02/26/1966 Today's Date: 05/09/2023   History of Present Illness   Cole Eastridge is a 58 year old Caucasian male with a history of MDD, anxiety, Crohn's disease, and chronic mesenteric ischemia who presented to ED on 04/13/23 at 0404 for suicide attempt by ingestion of 28 tablets of 10mg  Lexapro at 0200. Patient sent to behavior Med unit.     Clinical Impressions Pt seen for OT re-evaluation this date. Pt endorsing fatigue after not sleeping well the night before but agreeable. Pt has demonstrated excellent progress towards OT goals. Pt in agreement with functional and mental/emotional improvements. Pt completing mobility tasks with and without the RW and only PRN assist from staff for this. Pt also notes activity tolerance has improved and feels he could take a seated shower instead of a sponge bath when provided with the opportunity from nursing. OT facilitated problem solving to identify anticipated challenges upon discharge and pt educated in resources for caregiving and dementia (pt's father has dementia and pt cites this as a large worry of his once he discharges). Pt able to identify stress mgt/coping strategies as well as meaningful leisure occupations that could help him when he's having a hard time. Pt continues to benefit from skilled OT services while admitted.      If plan is discharge home, recommend the following:   A little help with bathing/dressing/bathroom;Assistance with cooking/housework;Assist for transportation;Direct supervision/assist for medications management;Direct supervision/assist for financial management;Supervision due to cognitive status;Help with stairs or ramp for entrance     Functional Status Assessment   Patient has had a recent decline in their functional status and demonstrates the ability to make significant improvements in function in a reasonable and  predictable amount of time.     Equipment Recommendations   Other (comment) (2ww)     Recommendations for Other Services         Precautions/Restrictions   Precautions Precautions: Fall Precaution/Restrictions Comments: intermittent dizziness Restrictions Weight Bearing Restrictions Per Provider Order: No     Mobility Bed Mobility Overal bed mobility: Modified Independent             General bed mobility comments: increased time/effort    Transfers Overall transfer level: Modified independent Equipment used: Rolling walker (2 wheels) Transfers: Sit to/from Stand Sit to Stand: Modified independent (Device/Increase time)                  Balance Overall balance assessment: Mild deficits observed, not formally tested Sitting-balance support: Feet supported Sitting balance-Leahy Scale: Normal     Standing balance support: Bilateral upper extremity supported Standing balance-Leahy Scale: Fair                             ADL either performed or assessed with clinical judgement   ADL Overall ADL's : Needs assistance/impaired                                       General ADL Comments: Pt continues to require increased assist and/or increased time/effort for higher level ADL tasks such as bathing     Vision         Perception         Praxis         Pertinent Vitals/Pain Pain Assessment Pain Assessment: No/denies pain  Extremity/Trunk Assessment Upper Extremity Assessment Upper Extremity Assessment: Generalized weakness   Lower Extremity Assessment Lower Extremity Assessment: Generalized weakness   Cervical / Trunk Assessment Cervical / Trunk Assessment: Normal   Communication Communication Communication: No apparent difficulties   Cognition Arousal: Alert Behavior During Therapy: WFL for tasks assessed/performed Cognition: No family/caregiver present to determine baseline             OT -  Cognition Comments: Pt fatigued endorsing very poor sleep the night before but states otherwise "I'm doing pretty ok"                 Following commands: Intact       Cueing  General Comments   Cueing Techniques: Verbal cues      Exercises Other Exercises Other Exercises: OT facilitated problem solving to identify anticipated challenges upon discharge and pt educated in resources for caregiving and dementia (pt's father has dementia and pt cites this as a large worry of his once he discharges).   Shoulder Instructions      Home Living Family/patient expects to be discharged to:: Private residence Living Arrangements: Parent Available Help at Discharge: Family;Available PRN/intermittently Type of Home: House       Home Layout: Two level Alternate Level Stairs-Number of Steps: 12 (his bed/bath is on 2nd floor) Alternate Level Stairs-Rails: Left Bathroom Shower/Tub: Tub/shower unit;Walk-in shower (access to both, but uses tub/shower)   Firefighter: Standard     Home Equipment: Agricultural consultant (2 wheels)          Prior Functioning/Environment Prior Level of Function : Independent/Modified Independent             Mobility Comments: Patient has not been mobilizing much at home lately but denies AD use, endorses recent fall and fear of falling ADLs Comments: Pt reports fear of falling and decreased strength/energy as reasons he has completed limited ADL tasks like bathing and grooming. Notes the last "good day" he had he clipped his fingernails. Enjoys listening to instrumental music. Endorses being nervous about medication mgt (nervous he will take it too earlier or too late and one night woke up around 1am and didn't know what to do so he took a medication then decided he was just going to overdose because he didn't want to live anymore.    OT Problem List: Decreased strength;Decreased cognition;Decreased activity tolerance;Impaired balance (sitting and/or  standing);Decreased knowledge of use of DME or AE   OT Treatment/Interventions: Self-care/ADL training;Therapeutic exercise;Therapeutic activities;Cognitive remediation/compensation;DME and/or AE instruction;Patient/family education;Balance training      OT Goals(Current goals can be found in the care plan section)   Acute Rehab OT Goals Patient Stated Goal: keep getting better OT Goal Formulation: With patient Time For Goal Achievement: 05/23/23 Potential to Achieve Goals: Good   OT Frequency:  Min 1X/week    Co-evaluation              AM-PAC OT "6 Clicks" Daily Activity     Outcome Measure Help from another person eating meals?: None Help from another person taking care of personal grooming?: A Little Help from another person toileting, which includes using toliet, bedpan, or urinal?: A Little Help from another person bathing (including washing, rinsing, drying)?: A Little Help from another person to put on and taking off regular upper body clothing?: None Help from another person to put on and taking off regular lower body clothing?: A Little 6 Click Score: 20   End of Session    Activity Tolerance: Patient  tolerated treatment well Patient left: in bed;with call bell/phone within reach  OT Visit Diagnosis: Other abnormalities of gait and mobility (R26.89);Muscle weakness (generalized) (M62.81)                Time: 8295-6213 OT Time Calculation (min): 18 min Charges:  OT General Charges $OT Visit: 1 Visit OT Evaluation $OT Re-eval: 1 Re-eval  Arman Filter., MPH, MS, OTR/L ascom [phone] 05/09/23, 4:26 PM

## 2023-05-09 NOTE — Group Note (Signed)
 Recreation Therapy Group Note   Group Topic:Health and Wellness  Group Date: 05/09/2023 Start Time: 1400 End Time: 1450 Facilitators: Rosina Lowenstein, LRT, CTRS Location:  Dayroom  Activity Description/Intervention: Therapeutic Drumming. Patients with peers and staff were given the opportunity to engage in a leader facilitated [facility] Group Empowerment Drumming Circle with staff from the [employer], in partnership with [employer]. Teaching laboratory technician and trained [employer], Theodoro Doing leading with LRT observing and documenting intervention and pt response. This evidenced-based practice targets 7 areas of health and wellbeing in the human experience including: stress-reduction, exercise, self-expression, camaraderie/support, nurturing, spirituality, and music-making (leisure).    Goal Area(s) Addresses:  Patient will engage in pro-social way in music group.  Patient will follow directions of drum leader on the first prompt. Patient will demonstrate no behavioral issues during group.  Patient will identify if a reduction in stress level occurs as a result of participation in therapeutic drum circle.     Affect/Mood: N/A   Participation Level: Did not attend    Clinical Observations/Individualized Feedback: Patient did not attend group.   Plan: Continue to engage patient in RT group sessions 2-3x/week.   Rosina Lowenstein, LRT, CTRS 05/09/2023 5:13 PM

## 2023-05-10 DIAGNOSIS — F332 Major depressive disorder, recurrent severe without psychotic features: Secondary | ICD-10-CM | POA: Diagnosis not present

## 2023-05-10 NOTE — Progress Notes (Signed)
 [facility] MD Progress Note  05/10/2023  AMMAAR ENCINA  MRN:  [id number]  [name] is a 58 year old Caucasian male with a history of MDD, anxiety, Crohn's disease, and chronic mesenteric ischemia who presented to [facility] emergency department on 04/13/23 at 0404 for suicide attempt by ingestion of 28 tablets of 10mg  Lexapro at 0200. Per ED documentation, patient had some mild symptoms of serotonin syndrome on arrival, including acute agitation and clonus, with symptomatic improvement with benzodiazepines. Did have a return of symptoms requiring additional dose of lorazepam. Poison control advised the total amount of ingested Lexapro likely is not enough to cause serotonin syndrome by itself and they recommended 8-hour cardiac monitoring. Qtc 498.  Subjective:  Chart reviewed, case discussed in multidisciplinary meeting, patient seen during rounds.  Patient is noted to be in a good mood.  He consistently denies SI/HI/plan.  He denies auditory/visual hallucinations.  He reports chronic depression and anxiety but has noticed to reportedly with the medication changes.  He reports that the mood stabilizer Abilify is helping him with his mood.  He reports talking to his brother about his placement in [place].  He reports that he is able to walk around and is getting some exercise on the unit. Appetite:  Fair  Past Psychiatric History: see h&P Family History:  Family History  Problem Relation Age of Onset   Heart disease Father    Thyroid cancer Father    Allergies Father    Clotting disorder Father    Breast cancer Mother    Stomach cancer Mother    Colon cancer Neg Hx    Esophageal cancer Neg Hx    Rectal cancer Neg Hx    Social History:  Social History   Substance and Sexual Activity  Alcohol Use No   Alcohol/week: 0.0 standard drinks of alcohol     Social History   Substance and Sexual Activity  Drug Use Not Currently   Types: Cocaine, Marijuana   Comment: QUIT USING DRUGS  IN 1997    Social History   Socioeconomic History   Marital status: Single    Spouse name: Not on file   Number of children: 1   Years of education: Not on file   Highest education level: Not on file  Occupational History   Occupation: Disability   Tobacco Use   Smoking status: Former    Current packs/day: 0.00    Average packs/day: 1 pack/day for 15.0 years (15.0 ttl pk-yrs)    Types: Cigarettes    Start date: 03/28/1982    Quit date: 03/28/1997    Years since quitting: 26.1   Smokeless tobacco: Never  Vaping Use   Vaping status: Never Used  Substance and Sexual Activity   Alcohol use: No    Alcohol/week: 0.0 standard drinks of alcohol   Drug use: Not Currently    Types: Cocaine, Marijuana    Comment: QUIT USING DRUGS IN 1997   Sexual activity: Not Currently  Other Topics Concern   Not on file  Social History Narrative   Single, history of substance abuse in recovery   Previously occupied on medical disability   1 child  Daughter Wellsite geologist, lives and works in [place]   Elderly parents involved and he helps them   1 brother   Social Drivers of Corporate investment banker Strain: Low Risk  (08/28/2019)   Overall Financial Resource Strain (CARDIA)    Difficulty of Paying Living Expenses: Not hard at all  Food Insecurity:  Food Insecurity Present (04/13/2023)   Hunger Vital Sign    Worried About Running Out of Food in the Last Year: Never true    Ran Out of Food in the Last Year: Sometimes true  Transportation Needs: No Transportation Needs (04/13/2023)   PRAPARE - Administrator, Civil Service (Medical): No    Lack of Transportation (Non-Medical): No  Physical Activity: Insufficiently Active (11/08/2019)   Exercise Vital Sign    Days of Exercise per Week: 3 days    Minutes of Exercise per Session: 30 min  Stress: Stress Concern Present (08/28/2019)   [employer] of Occupational Health - Occupational Stress Questionnaire    Feeling of Stress : Very much   Social Connections: Socially Isolated (03/28/2023)   Social Connection and Isolation Panel [NHANES]    Frequency of Communication with Friends and Family: Three times a week    Frequency of Social Gatherings with Friends and Family: Twice a week    Attends Religious Services: Never    Database administrator or Organizations: No    Attends Engineer, structural: Never    Marital Status: Divorced   Past Medical History:  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 06/15/2018   Cancer of skin of neck    Cataract    Chronic mesenteric ischemia ([name])    Chronic pain syndrome 07/09/2016   Crohn's disease of small and large intestines ([name])    followed by dr Baldo Ash gessner   Dairy product intolerance    Diarrhea, functional    Family history of adverse reaction to anesthesia    Grover's disease    transient acantholytic dermatosis   History of alcohol abuse    History of basal cell carcinoma excision    2013 left leg   History of Clostridium difficile    10/ 2014   History of multiple concussions    x6   last one Jan 2017 per pt--  no residual   History of substance abuse ([name])    quit 1997 per pt   History of suicide attempt    05-18-2012  overdose /  failure to thrive   Iron deficiency anemia due to chronic blood loss    Major depression, recurrent, chronic ([name])    Osteopenia    Personal history of adenomatous colonic polyps 12/2010, 03/2012   12/2010 - 8 mm serrated adenoma of rectum   Portal vein thrombosis 03/21/2015   right   Primary sclerosing cholangitis    ? 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   Substance abuse ([name]) 1997   Alcohol   Vitamin A deficiency  05/23/2018   Vitamin B6 deficiency 07/27/2018   Vitamin C deficiency 05/23/2018    Past Surgical History:  Procedure Laterality Date   ABDOMINAL AORTAGRAM N/A 03/29/2012   Procedure: ABDOMINAL Ronny Flurry;  Surgeon: Nada Libman, MD;  [facility] CATH LAB;  Service: Cardiovascular;  Laterality: N/A;   ADENOIDECTOMY  age 88   BIOPSY  05/31/2018   Procedure: BIOPSY;  Surgeon: Rachael Fee, MD;  Location: [facility] ENDOSCOPY;  Service: Endoscopy;;   CATARACT EXTRACTION W/ INTRAOCULAR LENS  IMPLANT, BILATERAL  2009   COLONOSCOPY  2001, 05/02/2003, 01/28/11   2012: Right colon Crohn's, rectal polyp   COLONOSCOPY  03/31/2012   Procedure: COLONOSCOPY;  Surgeon: Beverley Fiedler, MD;  Location: [facility] ENDOSCOPY;  Service: Gastroenterology;  Laterality: N/A;   COLONOSCOPY WITH PROPOFOL N/A 05/31/2018   Procedure: COLONOSCOPY WITH PROPOFOL;  Surgeon: Rachael Fee, MD;  Location: [facility] ENDOSCOPY;  Service: Endoscopy;  Laterality: N/A;   ESOPHAGOGASTRODUODENOSCOPY  01/28/2011   Normal   FOOT SURGERY Right age 45   MOHS SURGERY Left 11/2013   left ankle parakerotosis    PERCUTANEOUS LIVER BIOPSY  2007 and 2008   PILONIDAL CYST EXCISION  age 18   PLACEMENT OF SETON N/A 11/06/2015   Procedure: PLACEMENT OF SETON;  Surgeon: Romie Levee, MD;  Location: [facility];  Service: General;  Laterality: N/A;   PLACEMENT OF SETON  07/2018   at [facility]   RECTAL EXAM UNDER ANESTHESIA N/A 01/18/2019   Procedure: ANAL EXAM UNDER ANESTHESIA,  INCISION AND DRAINAGE, SETON PLACEMENT;  Surgeon: Romie Levee, MD;  Location: [facility];  Service: General;  Laterality: N/A;   UPPER GASTROINTESTINAL ENDOSCOPY      Current Medications: Current Facility-Administered Medications  Medication Dose Route Frequency Provider Last Rate Last Admin   acetaminophen (TYLENOL) tablet 650 mg  650 mg Oral Q6H PRN Motley-Mangrum, Jadeka A, PMHNP       alum & mag hydroxide-simeth (MAALOX/MYLANTA) 200-200-20  MG/5ML suspension 30 mL  30 mL Oral Q4H PRN Motley-Mangrum, Jadeka A, PMHNP       ARIPiprazole (ABILIFY) tablet 5 mg  5 mg Oral QHS Verner Chol, MD   5 mg at 05/09/23 2116   doxycycline (VIBRA-TABS) tablet 100 mg  100 mg Oral Q12H Floydene Flock, MD   100 mg at 05/10/23 0915   feeding supplement (ENSURE ENLIVE / ENSURE PLUS) liquid 237 mL  1 Bottle Oral TID BM Verner Chol, MD   237 mL at 05/10/23 2841   hydrOXYzine (ATARAX) tablet 25 mg  25 mg Oral TID PRN Motley-Mangrum, Ezra Sites, PMHNP   25 mg at 05/09/23 2116   liver oil-zinc oxide (DESITIN) 40 % ointment   Topical Q12H PRN Verner Chol, MD   Given at 05/04/23 0803   LORazepam (ATIVAN) injection 1 mg  1 mg Intramuscular Q4H PRN Remington, Amber E, NP       LORazepam (ATIVAN) injection 2 mg  2 mg Intramuscular TID PRN Motley-Mangrum, Jadeka A, PMHNP       LORazepam (ATIVAN) tablet 1 mg  1 mg Oral Q4H PRN Remington, Amber E, NP   1 mg at 05/10/23 0208   magnesium hydroxide (MILK OF MAGNESIA) suspension 30 mL  30 mL Oral Daily PRN Motley-Mangrum, Jadeka A, PMHNP       multivitamin with minerals tablet 1 tablet  1 tablet Oral Daily Verner Chol, MD   1 tablet at 05/10/23 0915   neomycin-bacitracin-polymyxin [phone] OINT   Topical Q6H PRN Lewanda Rife, MD   1 Application at 05/10/23 0920   ondansetron (ZOFRAN) injection 4 mg  4 mg Intravenous Q6H PRN Verner Chol, MD   4 mg at 04/16/23 0905   ondansetron (ZOFRAN-ODT) disintegrating tablet 4 mg  4 mg Oral Q8H PRN Verner Chol, MD       predniSONE (DELTASONE) tablet 30 mg  30 mg Oral Q breakfast Motley-Mangrum, Jadeka A, PMHNP   30 mg at 05/10/23 0915   venlafaxine XR (EFFEXOR-XR) 24 hr capsule 150  mg  150 mg Oral Q breakfast Lewanda Rife, MD   150 mg at 05/10/23 0915        Blood Alcohol level:  Lab Results  Component Value Date   ETH <10 04/13/2023   ETH <10 03/28/2023    Metabolic Disorder Labs: Lab Results  Component Value Date   HGBA1C 4.8  04/03/2023   MPG 91.06 04/03/2023   No results found for: "PROLACTIN" Lab Results  Component Value Date   CHOL 209 (H) 04/03/2023   TRIG 75 04/03/2023   HDL 65 04/03/2023   CHOLHDL 3.2 04/03/2023   VLDL 15 04/03/2023   LDLCALC 129 (H) 04/03/2023    Psychiatric Specialty Exam:  Presentation  General Appearance:  Appropriate for Environment; Casual   Eye Contact: Fair   Speech: Clear and Coherent   Speech Volume: Normal       Mood and Affect  Mood: "Fine"   Affect: Less constricted     Thought Process  Thought Processes: Coherent   Descriptions of Associations:Intact   Orientation:Full (Time, Place and Person)   Thought Content:Logical   Hallucinations:Hallucinations: None   Ideas of Reference:None   Suicidal Thoughts:Suicidal Thoughts: No   Homicidal Thoughts:Homicidal Thoughts: No     Sensorium  Memory: Immediate Fair; Recent Fair; Remote Fair   Judgment: Impaired   Insight: Shallow     Executive Functions  Concentration: Fair   Attention Span: Fair   Recall: [employer] of Knowledge: Fair   Language: Fair     Psychomotor Activity  Psychomotor Activity: Psychomotor Activity: Normal   Musculoskeletal: Strength & Muscle Tone: decreased Gait & Station: Improved, patient walks with the help of a walker Assets  Assets: Manufacturing systems engineer; Desire for Improvement; Resilience   Physical Exam: Physical Exam Vitals and nursing note reviewed.  HENT:     Head: Normocephalic.     Nose: No congestion.     Mouth/Throat:     Mouth: Mucous membranes are moist.  Eyes:     Pupils: Pupils are equal, round, and reactive to light.  Cardiovascular:     Rate and Rhythm: Normal rate.     Pulses: Normal pulses.  Pulmonary:     Breath sounds: Normal breath sounds.  Abdominal:     General: Bowel sounds are normal.  Neurological:     General: No focal deficit present.     Mental Status: He is alert.    Review of Systems   Constitutional:  Positive for malaise/fatigue.  HENT: Negative.    Eyes: Negative.   Cardiovascular: Negative.   Gastrointestinal: Negative.   Skin: Negative.   Blood pressure 109/75, pulse 82, temperature 98.1 F (36.7 C), resp. rate 18, height 6\' 1"  (1.854 m), weight 60.6 kg, SpO2 98%. Body mass index is 17.61 kg/m.  Diagnosis: Principal Problem:   MDD (major depressive disorder), recurrent severe, without psychosis ([name]) Active Problems:   GAD (generalized anxiety disorder)   Tremor  Clinical decision making-patient is currently admitted after a lethal suicide attempt by overdosing on her antidepressants.  Patient is noted to have disabling physical problems including Crohn's disease, chronic pain, physical weakness.  Patient continues to struggle with hopelessness, anhedonia, severe anxiety and panic attacks.  His suicidal ideation have improved to intermittent occurrence and denies any hallucinations.  We continue to adjust the medications to help with his treatment resistant depression.  He continues to need inpatient psychiatric hospitalization. PLAN: Safety and Monitoring:  -- Involuntary admission to inpatient psychiatric unit for safety, stabilization and  treatment  -- Daily contact with patient to assess and evaluate symptoms and progress in treatment  -- Patient's case to be discussed in multi-disciplinary team meeting  -- Observation Level : q15 minute checks  -- Vital signs:  q12 hours  -- Precautions: suicide, elopement, and assault -- Encouraged patient to participate in unit milieu and in scheduled group therapies  2. Psychiatric Diagnoses and Treatment:  Effexor Xr 150 mg daily Continue Abilify 5 mg at bedtime for mood stabilization given the intensity of depression.  Discussed thoroughly the side effects risk benefits of Abilify and patient gave consent for the medication.    3. Medical Issues Being Addressed:  None today   4. Discharge Planning:   -- Social  work and case management to assist with discharge planning and identification of hospital follow-up needs prior to discharge  -- Discharge date to be determined, Child psychotherapist and patient's family is working on home health care services  Verner Chol, MD

## 2023-05-10 NOTE — Group Note (Signed)
 Date:  05/10/2023 Time:  9:30 AM  Group Topic/Focus:  Movement therapy    Participation Level:  Did Not Attend    Rodena Goldmann 05/10/2023, 9:30 AM

## 2023-05-10 NOTE — Group Note (Signed)
 Date:  05/10/2023 Time:  3:05 AM  Group Topic/Focus:  Wrap-Up Group:   The focus of this group is to help patients review their daily goal of treatment and discuss progress on daily workbooks.    Participation Level:  Active  Participation Quality:  Appropriate  Affect:  Appropriate  Cognitive:  Appropriate  Insight: Good  Engagement in Group:  Engaged  Modes of Intervention:  Discussion  Additional Comments:    Maeola Harman 05/10/2023, 3:05 AM

## 2023-05-10 NOTE — BH IP Treatment Plan (Signed)
 Interdisciplinary Treatment and Diagnostic Plan Update  05/10/2023 Time of Session: 11:30 AM  XAIVIER [name] MRN: [id number]  Principal Diagnosis: MDD (major depressive disorder), recurrent severe, without psychosis ([name])  Secondary Diagnoses: Principal Problem:   MDD (major depressive disorder), recurrent severe, without psychosis ([name]) Active Problems:   GAD (generalized anxiety disorder)   Tremor   Current Medications:  Current Facility-Administered Medications  Medication Dose Route Frequency Provider Last Rate Last Admin   acetaminophen (TYLENOL) tablet 650 mg  650 mg Oral Q6H PRN Motley-Mangrum, Jadeka A, PMHNP       alum & mag hydroxide-simeth (MAALOX/MYLANTA) 200-200-20 MG/5ML suspension 30 mL  30 mL Oral Q4H PRN Motley-Mangrum, Jadeka A, PMHNP       ARIPiprazole (ABILIFY) tablet 5 mg  5 mg Oral QHS Verner Chol, MD   5 mg at 05/09/23 2116   doxycycline (VIBRA-TABS) tablet 100 mg  100 mg Oral Q12H Floydene Flock, MD   100 mg at 05/10/23 0915   feeding supplement (ENSURE ENLIVE / ENSURE PLUS) liquid 237 mL  1 Bottle Oral TID BM Verner Chol, MD   237 mL at 05/10/23 8295   hydrOXYzine (ATARAX) tablet 25 mg  25 mg Oral TID PRN Motley-Mangrum, Ezra Sites, PMHNP   25 mg at 05/09/23 2116   liver oil-zinc oxide (DESITIN) 40 % ointment   Topical Q12H PRN Verner Chol, MD   Given at 05/04/23 0803   LORazepam (ATIVAN) injection 1 mg  1 mg Intramuscular Q4H PRN Remington, Amber E, NP       LORazepam (ATIVAN) injection 2 mg  2 mg Intramuscular TID PRN Motley-Mangrum, Geralynn Ochs A, PMHNP       LORazepam (ATIVAN) tablet 1 mg  1 mg Oral Q4H PRN Remington, Amber E, NP   1 mg at 05/10/23 0208   magnesium hydroxide (MILK OF MAGNESIA) suspension 30 mL  30 mL Oral Daily PRN Motley-Mangrum, Jadeka A, PMHNP       multivitamin with minerals tablet 1 tablet  1 tablet Oral Daily Verner Chol, MD   1 tablet at 05/10/23 0915   neomycin-bacitracin-polymyxin [phone] OINT   Topical Q6H PRN  Lewanda Rife, MD   1 Application at 05/10/23 0920   ondansetron (ZOFRAN) injection 4 mg  4 mg Intravenous Q6H PRN Verner Chol, MD   4 mg at 04/16/23 0905   ondansetron (ZOFRAN-ODT) disintegrating tablet 4 mg  4 mg Oral Q8H PRN Verner Chol, MD       predniSONE (DELTASONE) tablet 30 mg  30 mg Oral Q breakfast Motley-Mangrum, Jadeka A, PMHNP   30 mg at 05/10/23 0915   venlafaxine XR (EFFEXOR-XR) 24 hr capsule 150 mg  150 mg Oral Q breakfast Lewanda Rife, MD   150 mg at 05/10/23 0915   PTA Medications: Medications Prior to Admission  Medication Sig Dispense Refill Last Dose/Taking   busPIRone (BUSPAR) 10 MG tablet Take 1 tablet (10 mg total) by mouth 2 (two) times daily. 30 tablet 0 04/12/2023   dicyclomine (BENTYL) 10 MG capsule Take 1 capsule (10 mg total) by mouth 3 (three) times daily before meals. 90 capsule 0 Taking   escitalopram (LEXAPRO) 10 MG tablet Take 1 tablet (10 mg total) by mouth at bedtime. 30 tablet 0 04/13/2023   ketoconazole (NIZORAL) 2 % cream Apply 1 Application topically daily.   Taking   LORazepam (ATIVAN) 0.5 MG tablet Take 0.5 mg by mouth daily.   Taking   mirtazapine (REMERON) 30 MG tablet Take 1 tablet (30 mg total) by  mouth at bedtime. 30 tablet 0 04/11/2023   predniSONE (DELTASONE) 20 MG tablet Take 1.5 tablets (30 mg total) by mouth daily with breakfast. 100 tablet 2 Past Month    Patient Stressors:    Patient Strengths:    Treatment Modalities: Medication Management, Group therapy, Case management,  1 to 1 session with clinician, Psychoeducation, Recreational therapy.   Physician Treatment Plan for Primary Diagnosis: MDD (major depressive disorder), recurrent severe, without psychosis ([name]) Long Term Goal(s):     Short Term Goals:    Medication Management: Evaluate patient's response, side effects, and tolerance of medication regimen.  Therapeutic Interventions: 1 to 1 sessions, Unit Group sessions and Medication  administration.  Evaluation of Outcomes: Progressing  Physician Treatment Plan for Secondary Diagnosis: Principal Problem:   MDD (major depressive disorder), recurrent severe, without psychosis ([name]) Active Problems:   GAD (generalized anxiety disorder)   Tremor  Long Term Goal(s):     Short Term Goals:       Medication Management: Evaluate patient's response, side effects, and tolerance of medication regimen.  Therapeutic Interventions: 1 to 1 sessions, Unit Group sessions and Medication administration.  Evaluation of Outcomes: Progressing   RN Treatment Plan for Primary Diagnosis: MDD (major depressive disorder), recurrent severe, without psychosis ([name]) Long Term Goal(s): Knowledge of disease and therapeutic regimen to maintain health will improve  Short Term Goals: Ability to remain free from injury will improve, Ability to verbalize frustration and anger appropriately will improve, Ability to demonstrate self-control, Ability to participate in decision making will improve, Ability to verbalize feelings will improve, Ability to disclose and discuss suicidal ideas, Ability to identify and develop effective coping behaviors will improve, and Compliance with prescribed medications will improve  Medication Management: RN will administer medications as ordered by provider, will assess and evaluate patient's response and provide education to patient for prescribed medication. RN will report any adverse and/or side effects to prescribing provider.  Therapeutic Interventions: 1 on 1 counseling sessions, Psychoeducation, Medication administration, Evaluate responses to treatment, Monitor vital signs and CBGs as ordered, Perform/monitor CIWA, COWS, AIMS and Fall Risk screenings as ordered, Perform wound care treatments as ordered.  Evaluation of Outcomes: Progressing   LCSW Treatment Plan for Primary Diagnosis: MDD (major depressive disorder), recurrent severe, without psychosis  ([name]) Long Term Goal(s): Safe transition to appropriate next level of care at discharge, Engage patient in therapeutic group addressing interpersonal concerns.  Short Term Goals: Engage patient in aftercare planning with referrals and resources, Increase social support, Increase ability to appropriately verbalize feelings, Increase emotional regulation, Facilitate acceptance of mental health diagnosis and concerns, Facilitate patient progression through stages of change regarding substance use diagnoses and concerns, Identify triggers associated with mental health/substance abuse issues, and Increase skills for wellness and recovery  Therapeutic Interventions: Assess for all discharge needs, 1 to 1 time with Social worker, Explore available resources and support systems, Assess for adequacy in community support network, Educate family and significant other(s) on suicide prevention, Complete Psychosocial Assessment, Interpersonal group therapy.  Evaluation of Outcomes: Progressing   Progress in Treatment: Attending groups: Yes. and No. Participating in groups: Yes. and No. Taking medication as prescribed: Yes. Toleration medication: Yes. Family/Significant other contact made: Yes, individual(s) contacted:  Feliberto Harts, brother , [place], mom Patient understands diagnosis: Yes. Discussing patient identified problems/goals with staff: Yes. Medical problems stabilized or resolved: Yes. Denies suicidal/homicidal ideation: Yes. Issues/concerns per patient self-inventory: No. Other: none identified   New problem(s) identified: No, Describe:   No, Describe:  None identified  Update 04/20/23: No changes at this time Update 04/25/23: No changes at this time Update 04/30/23: No changes at this time Update 05/05/23: No changes at this time Update 05/10/23: No changes at this time   New Short Term/Long Term Goal(s): elimination of symptoms of psychosis, medication management for mood stabilization;  elimination of SI thoughts; development of comprehensive mental wellness plan. Update 04/20/23: No changes at this time Update 04/25/23: No changes at this time 3/1/205 none at this time Update 05/05/23: No changes at this time Update 05/10/23: No changes at this time       Patient Goals:  " I want to get control of my anxiety" Update 04/20/23: No changes at this time Update 04/25/23: No changes at this time 04/30/23 no changes Update 05/05/23: No changes at this time Update 05/10/23: No changes at this time Update 05/10/23: No changes at this time     Discharge Plan or Barriers: CSW to assist with appropriate discharge planning Update 04/20/23: No changes at this time Update 04/25/23: Pt's brother is assisting him with finding independent living facility. CSW will check with  pt's brother on progress at this time. Provider has suggested ECT as a possibility depression does not seem to be getting better Update 05/05/23: Pt's family wants him to go to [place], residential treatment center. CSW to assist with admissions process Update 05/10/23: Pt to have admission interview with [facility] at 10:00 AM on Thursday    Reason for Continuation of Hospitalization: Anxiety medication stabilization, depression   Estimated Length of Stay: 1-7 days Update 05/05/23: TBD Update 05/10/23: TBD  Last 3 [place] Suicide Severity Risk Score: Flowsheet Row Admission (Current) from [facility]/13/2023 in [facility] Most recent reading at 04/13/2023  7:45 PM ED from 04/13/2023 in [facility] Emergency Department at [facility] Most recent reading at 04/13/2023  4:16 AM ED from 04/11/2023 in [facility] Most recent reading at 04/11/2023  1:11 PM  C-SSRS RISK CATEGORY High Risk High Risk No Risk       Last PHQ 2/9 Scores:    06/06/2020   11:06 AM 03/06/2020    2:49 PM 08/28/2019    1:04 PM  Depression screen PHQ 2/9  Decreased Interest 0 0 0  Down, Depressed, Hopeless 0 1 1  PHQ - 2 Score  0 1 1  Altered sleeping 1 2 3   Tired, decreased energy 1 2 3   Change in appetite 1 1 1   Feeling bad or failure about yourself  0 0 0  Trouble concentrating 1 2 3   Moving slowly or fidgety/restless 1 0 0  Suicidal thoughts 0 0 0  PHQ-9 Score 5 8 11   Difficult doing work/chores   Somewhat difficult    Scribe for Treatment Team: Elza Rafter, Theresia Majors 05/10/2023 1:09 PM

## 2023-05-10 NOTE — Group Note (Signed)
 Date:  05/10/2023 Time:  8:54 PM  Group Topic/Focus:  Wrap-Up Group:   The focus of this group is to help patients review their daily goal of treatment and discuss progress on daily workbooks.    Participation Level:  Active  Participation Quality:  Appropriate  Affect:  Appropriate  Cognitive:  Appropriate  Insight: Good  Engagement in Group:  Engaged  Modes of Intervention:  Discussion  Additional Comments:    Simrat Kendrick Luretha Rued 05/10/2023, 8:54 PM

## 2023-05-10 NOTE — Group Note (Signed)
 Recreation Therapy Group Note   Group Topic:Stress Management  Group Date: 05/10/2023 Start Time: 1215 End Time: 1300 Facilitators: Rosina Lowenstein, LRT, CTRS Location: Courtyard  Group Description: Outdoor Recreation. Patients had the option to play corn hole, ring toss, bowling or listening to music while outside in the courtyard getting fresh air and sunlight. LRT and patients discussed things that they enjoy doing in their free time outside of the hospital. LRT encouraged patients to drink water after being active and getting their heart rate up.   Goal Area(s) Addressed: Patient will identify leisure interests.  Patient will practice healthy decision making. Patient will engage in recreation activity.   Affect/Mood: N/A   Participation Level: Did not attend    Clinical Observations/Individualized Feedback: Patient did not attend group.   Plan: Continue to engage patient in RT group sessions 2-3x/week.   Rosina Lowenstein, LRT, CTRS 05/10/2023 1:25 PM

## 2023-05-10 NOTE — Plan of Care (Signed)
 [name]

## 2023-05-10 NOTE — Progress Notes (Signed)
   05/10/23 1050  Psych Admission Type (Psych Patients Only)  Admission Status Voluntary  Psychosocial Assessment  Patient Complaints Anxiety;Depression  Eye Contact Brief  Facial Expression Sad;Anxious  Affect Anxious;Sad  Speech Logical/coherent  Interaction Minimal  Motor Activity Slow  Appearance/Hygiene In scrubs  Behavior Characteristics Cooperative  Mood Pleasant  Thought Process  Coherency WDL  Content WDL  Delusions None reported or observed  Perception WDL  Hallucination None reported or observed  Judgment Impaired  Confusion None  Danger to Self  Current suicidal ideation? Denies  Self-Injurious Behavior No self-injurious ideation or behavior indicators observed or expressed   Agreement Not to Harm Self Yes  Description of Agreement verbal  Danger to Others  Danger to Others None reported or observed

## 2023-05-11 DIAGNOSIS — F332 Major depressive disorder, recurrent severe without psychotic features: Secondary | ICD-10-CM | POA: Diagnosis not present

## 2023-05-11 NOTE — Group Note (Signed)
 Date:  05/11/2023 Time:  10:36 AM  Group Topic/Focus:  Orientation:   The focus of this group is to educate the patient on the purpose and policies of crisis stabilization and provide a format to answer questions about their admission.  The group details unit policies and expectations of patients while admitted.    Participation Level:  Active  Participation Quality:  Appropriate  Affect:  Appropriate  Cognitive:  Alert and Appropriate  Insight: Appropriate  Engagement in Group:  Engaged  Modes of Intervention:  Socialization  Additional Comments:     Alexis Frock 05/11/2023, 10:36 AM

## 2023-05-11 NOTE — Plan of Care (Signed)
   Problem: Education: Goal: Emotional status will improve Outcome: Progressing Goal: Mental status will improve Outcome: Progressing

## 2023-05-11 NOTE — Progress Notes (Signed)
 [facility] MD Progress Note  05/11/2023  [name]  MRN:  [id number]  [name] is a 58 year old Caucasian male with a history of MDD, anxiety, Crohn's disease, and chronic mesenteric ischemia who presented to [facility] emergency department on 04/13/23 at 0404 for suicide attempt by ingestion of 28 tablets of 10mg  Lexapro at 0200. Per ED documentation, patient had some mild symptoms of serotonin syndrome on arrival, including acute agitation and clonus, with symptomatic improvement with benzodiazepines. Did have a return of symptoms requiring additional dose of lorazepam. Poison control advised the total amount of ingested Lexapro likely is not enough to cause serotonin syndrome by itself and they recommended 8-hour cardiac monitoring. Qtc 498.  Subjective:  Chart reviewed, case discussed in multidisciplinary meeting, patient seen during rounds.  Patient reports doing well.  He has fair appetite and sleep.  He reports that he is feeling anxious about the transition and going to [place].  Provider and patient discussed about the facility that he is going.  Patient did acknowledge that the facility is costing him a lot of money and hoping for the best care that he can get.  He denies SI/HI/intent/plan.  He denies auditory/visual hallucinations.  He is taking his medications with no reported side effects. Appetite:  Fair  Past Psychiatric History: see h&P Family History:  Family History  Problem Relation Age of Onset   Heart disease Father    Thyroid cancer Father    Allergies Father    Clotting disorder Father    Breast cancer Mother    Stomach cancer Mother    Colon cancer Neg Hx    Esophageal cancer Neg Hx    Rectal cancer Neg Hx    Social History:  Social History   Substance and Sexual Activity  Alcohol Use No   Alcohol/week: 0.0 standard drinks of alcohol     Social History   Substance and Sexual Activity  Drug Use Not Currently   Types: Cocaine, Marijuana    Comment: QUIT USING DRUGS IN 1997    Social History   Socioeconomic History   Marital status: Single    Spouse name: Not on file   Number of children: 1   Years of education: Not on file   Highest education level: Not on file  Occupational History   Occupation: Disability   Tobacco Use   Smoking status: Former    Current packs/day: 0.00    Average packs/day: 1 pack/day for 15.0 years (15.0 ttl pk-yrs)    Types: Cigarettes    Start date: 03/28/1982    Quit date: 03/28/1997    Years since quitting: 26.1   Smokeless tobacco: Never  Vaping Use   Vaping status: Never Used  Substance and Sexual Activity   Alcohol use: No    Alcohol/week: 0.0 standard drinks of alcohol   Drug use: Not Currently    Types: Cocaine, Marijuana    Comment: QUIT USING DRUGS IN 1997   Sexual activity: Not Currently  Other Topics Concern   Not on file  Social History Narrative   Single, history of substance abuse in recovery   Previously occupied on medical disability   1 child  Daughter Wellsite geologist, lives and works in [place]   Elderly parents involved and he helps them   1 brother   Social Drivers of Corporate investment banker Strain: Low Risk  (08/28/2019)   Overall Financial Resource Strain (CARDIA)    Difficulty of Paying Living Expenses: Not hard at all  Food Insecurity: Food Insecurity Present (04/13/2023)   Hunger Vital Sign    Worried About Running Out of Food in the Last Year: Never true    Ran Out of Food in the Last Year: Sometimes true  Transportation Needs: No Transportation Needs (04/13/2023)   PRAPARE - Administrator, Civil Service (Medical): No    Lack of Transportation (Non-Medical): No  Physical Activity: Insufficiently Active (11/08/2019)   Exercise Vital Sign    Days of Exercise per Week: 3 days    Minutes of Exercise per Session: 30 min  Stress: Stress Concern Present (08/28/2019)   [employer] of Occupational Health - Occupational Stress Questionnaire     Feeling of Stress : Very much  Social Connections: Socially Isolated (03/28/2023)   Social Connection and Isolation Panel [NHANES]    Frequency of Communication with Friends and Family: Three times a week    Frequency of Social Gatherings with Friends and Family: Twice a week    Attends Religious Services: Never    Database administrator or Organizations: No    Attends Engineer, structural: Never    Marital Status: Divorced   Past Medical History:  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 06/15/2018   Cancer of skin of neck    Cataract    Chronic mesenteric ischemia ([name])    Chronic pain syndrome 07/09/2016   Crohn's disease of small and large intestines ([name])    followed by dr Baldo Ash gessner   Dairy product intolerance    Diarrhea, functional    Family history of adverse reaction to anesthesia    Grover's disease    transient acantholytic dermatosis   History of alcohol abuse    History of basal cell carcinoma excision    2013 left leg   History of Clostridium difficile    10/ 2014   History of multiple concussions    x6   last one Jan 2017 per pt--  no residual   History of substance abuse ([name])    quit 1997 per pt   History of suicide attempt    05-18-2012  overdose /  failure to thrive   Iron deficiency anemia due to chronic blood loss    Major depression, recurrent, chronic ([name])    Osteopenia    Personal history of adenomatous colonic polyps 12/2010, 03/2012   12/2010 - 8 mm serrated adenoma of rectum   Portal vein thrombosis 03/21/2015   right   Primary sclerosing cholangitis    ? 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   Substance abuse ([name]) 1997   Alcohol    Vitamin A deficiency 05/23/2018   Vitamin B6 deficiency 07/27/2018   Vitamin C deficiency 05/23/2018    Past Surgical History:  Procedure Laterality Date   ABDOMINAL AORTAGRAM N/A 03/29/2012   Procedure: ABDOMINAL Ronny Flurry;  Surgeon: Nada Libman, MD;  Location: [facility] CATH LAB;  Service: Cardiovascular;  Laterality: N/A;   ADENOIDECTOMY  age 15   BIOPSY  05/31/2018   Procedure: BIOPSY;  Surgeon: Rachael Fee, MD;  Location: [facility] ENDOSCOPY;  Service: Endoscopy;;   CATARACT EXTRACTION W/  INTRAOCULAR LENS  IMPLANT, BILATERAL  2009   COLONOSCOPY  2001, 05/02/2003, 01/28/11   2012: Right colon Crohn's, rectal polyp   COLONOSCOPY  03/31/2012   Procedure: COLONOSCOPY;  Surgeon: Beverley Fiedler, MD;  Location: [facility] ENDOSCOPY;  Service: Gastroenterology;  Laterality: N/A;   COLONOSCOPY WITH PROPOFOL N/A 05/31/2018   Procedure: COLONOSCOPY WITH PROPOFOL;  Surgeon: Rachael Fee, MD;  Location: [facility] ENDOSCOPY;  Service: Endoscopy;  Laterality: N/A;   ESOPHAGOGASTRODUODENOSCOPY  01/28/2011   Normal   FOOT SURGERY Right age 16   MOHS SURGERY Left 11/2013   left ankle parakerotosis    PERCUTANEOUS LIVER BIOPSY  2007 and 2008   PILONIDAL CYST EXCISION  age 67   PLACEMENT OF SETON N/A 11/06/2015   Procedure: PLACEMENT OF SETON;  Surgeon: Romie Levee, MD;  Location: [facility];  Service: General;  Laterality: N/A;   PLACEMENT OF SETON  07/2018   at [facility]   RECTAL EXAM UNDER ANESTHESIA N/A 01/18/2019   Procedure: ANAL EXAM UNDER ANESTHESIA,  INCISION AND DRAINAGE, SETON PLACEMENT;  Surgeon: Romie Levee, MD;  Location: [facility];  Service: General;  Laterality: N/A;   UPPER GASTROINTESTINAL ENDOSCOPY      Current Medications: Current Facility-Administered Medications  Medication Dose Route Frequency Provider Last Rate Last Admin   acetaminophen (TYLENOL) tablet 650 mg  650 mg Oral Q6H PRN Motley-Mangrum, Jadeka A, PMHNP       alum & mag hydroxide-simeth  (MAALOX/MYLANTA) 200-200-20 MG/5ML suspension 30 mL  30 mL Oral Q4H PRN Motley-Mangrum, Jadeka A, PMHNP       ARIPiprazole (ABILIFY) tablet 5 mg  5 mg Oral QHS Verner Chol, MD   5 mg at 05/10/23 2040   doxycycline (VIBRA-TABS) tablet 100 mg  100 mg Oral Q12H Floydene Flock, MD   100 mg at 05/11/23 0933   feeding supplement (ENSURE ENLIVE / ENSURE PLUS) liquid 237 mL  1 Bottle Oral TID BM Verner Chol, MD   237 mL at 05/11/23 1424   hydrOXYzine (ATARAX) tablet 25 mg  25 mg Oral TID PRN Motley-Mangrum, Ezra Sites, PMHNP   25 mg at 05/10/23 1818   liver oil-zinc oxide (DESITIN) 40 % ointment   Topical Q12H PRN Verner Chol, MD   Given at 05/04/23 0803   LORazepam (ATIVAN) injection 1 mg  1 mg Intramuscular Q4H PRN Remington, Amber E, NP       LORazepam (ATIVAN) injection 2 mg  2 mg Intramuscular TID PRN Motley-Mangrum, Jadeka A, PMHNP       LORazepam (ATIVAN) tablet 1 mg  1 mg Oral Q4H PRN Remington, Amber E, NP   1 mg at 05/11/23 1346   magnesium hydroxide (MILK OF MAGNESIA) suspension 30 mL  30 mL Oral Daily PRN Motley-Mangrum, Jadeka A, PMHNP       multivitamin with minerals tablet 1 tablet  1 tablet Oral Daily Verner Chol, MD   1 tablet at 05/11/23 0933   neomycin-bacitracin-polymyxin [phone] OINT   Topical Q6H PRN Lewanda Rife, MD   1 Application at 05/10/23 0920   ondansetron (ZOFRAN) injection 4 mg  4 mg Intravenous Q6H PRN Verner Chol, MD   4 mg at 04/16/23 0905   ondansetron (ZOFRAN-ODT) disintegrating tablet 4 mg  4 mg Oral Q8H PRN Verner Chol, MD       predniSONE (DELTASONE) tablet 30 mg  30 mg Oral Q breakfast Motley-Mangrum, Jadeka A, PMHNP   30 mg at 05/11/23 0932   venlafaxine XR (EFFEXOR-XR) 24  hr capsule 150 mg  150 mg Oral Q breakfast Lewanda Rife, MD   150 mg at 05/11/23 0932        Blood Alcohol level:  Lab Results  Component Value Date   ETH <10 04/13/2023   ETH <10 03/28/2023    Metabolic Disorder Labs: Lab Results  Component  Value Date   HGBA1C 4.8 04/03/2023   MPG 91.06 04/03/2023   No results found for: "PROLACTIN" Lab Results  Component Value Date   CHOL 209 (H) 04/03/2023   TRIG 75 04/03/2023   HDL 65 04/03/2023   CHOLHDL 3.2 04/03/2023   VLDL 15 04/03/2023   LDLCALC 129 (H) 04/03/2023    Psychiatric Specialty Exam:  Presentation  General Appearance:  Appropriate for Environment; Casual   Eye Contact: Fair   Speech: Clear and Coherent   Speech Volume: Normal       Mood and Affect  Mood: "Fine"   Affect: Less constricted     Thought Process  Thought Processes: Coherent   Descriptions of Associations:Intact   Orientation:Full (Time, Place and Person)   Thought Content:Logical   Hallucinations:Hallucinations: None   Ideas of Reference:None   Suicidal Thoughts:Suicidal Thoughts: No   Homicidal Thoughts:Homicidal Thoughts: No     Sensorium  Memory: Immediate Fair; Recent Fair; Remote Fair   Judgment: Impaired   Insight: Shallow     Executive Functions  Concentration: Fair   Attention Span: Fair   Recall: [employer] of Knowledge: Fair   Language: Fair     Psychomotor Activity  Psychomotor Activity: Psychomotor Activity: Normal   Musculoskeletal: Strength & Muscle Tone: decreased Gait & Station: Improved, patient walks with the help of a walker Assets  Assets: Manufacturing systems engineer; Desire for Improvement; Resilience   Physical Exam: Physical Exam Vitals and nursing note reviewed.  HENT:     Head: Normocephalic.     Nose: No congestion.     Mouth/Throat:     Mouth: Mucous membranes are moist.  Eyes:     Pupils: Pupils are equal, round, and reactive to light.  Cardiovascular:     Rate and Rhythm: Normal rate.     Pulses: Normal pulses.  Pulmonary:     Breath sounds: Normal breath sounds.  Abdominal:     General: Bowel sounds are normal.  Neurological:     General: No focal deficit present.     Mental Status: He is alert.     Review of Systems  Constitutional:  Positive for malaise/fatigue.  HENT: Negative.    Eyes: Negative.   Cardiovascular: Negative.   Gastrointestinal: Negative.   Skin: Negative.   Blood pressure 107/73, pulse 75, temperature 98.3 F (36.8 C), resp. rate 18, height 6\' 1"  (1.854 m), weight 60.6 kg, SpO2 98%. Body mass index is 17.61 kg/m.  Diagnosis: Principal Problem:   MDD (major depressive disorder), recurrent severe, without psychosis ([name]) Active Problems:   GAD (generalized anxiety disorder)   Tremor  Clinical decision making-patient is currently admitted after a lethal suicide attempt by overdosing on her antidepressants.  Patient is noted to have disabling physical problems including Crohn's disease, chronic pain, physical weakness.  Patient continues to struggle with hopelessness, anhedonia, severe anxiety and panic attacks.  His suicidal ideation have improved to intermittent occurrence and denies any hallucinations.  We continue to adjust the medications to help with his treatment resistant depression.  He continues to need inpatient psychiatric hospitalization. PLAN: Safety and Monitoring:  -- Involuntary admission to inpatient psychiatric unit for  safety, stabilization and treatment  -- Daily contact with patient to assess and evaluate symptoms and progress in treatment  -- Patient's case to be discussed in multi-disciplinary team meeting  -- Observation Level : q15 minute checks  -- Vital signs:  q12 hours  -- Precautions: suicide, elopement, and assault -- Encouraged patient to participate in unit milieu and in scheduled group therapies  2. Psychiatric Diagnoses and Treatment:  Effexor Xr 150 mg daily Continue Abilify 5 mg at bedtime for mood stabilization given the intensity of depression.  Discussed thoroughly the side effects risk benefits of Abilify and patient gave consent for the medication.    3. Medical Issues Being Addressed:  None today   4. Discharge  Planning:   -- Social work and case management to assist with discharge planning and identification of hospital follow-up needs prior to discharge  -- Discharge date to be determined, Child psychotherapist and patient's family is working on home health care services  Verner Chol, MD

## 2023-05-11 NOTE — Group Note (Signed)
 Date:  05/11/2023 Time:  9:47 PM  Group Topic/Focus:  Self Care:   The focus of this group is to help patients understand the importance of self-care in order to improve or restore emotional, physical, spiritual, interpersonal, and financial health.    Participation Level:  Did Not Attend  Participation Quality:   Did Not Attend  Affect:   Did Not Attend  Cognitive:   Did Not Attend  Insight: None  Engagement in Group:  None  Modes of Intervention:   Did Not Attend  Additional Comments:    Garry Heater 05/11/2023, 9:47 PM

## 2023-05-11 NOTE — Group Note (Signed)
 Therapy Group Note  Group Topic: Transfer Training Group Date: 05/11/2023 Start Time: 1300 End Time: 1335 Facilitators: Sagal Gayton, Thomes Dinning, PT   Group Description: Group educated on sequence and techniques to maximize safety with functional transfers.  Additionally, integrated education on impact of seating surfaces, use of assistive device and management of orthostasis with movement transitions.  Patients actively engaged with functional transfers (sit/stand) from various seating surfaces, with and without assist devices, working to integrate and retain education provided during session.  Allowed time for questions and further discussion on mobility concerns/needs.     Therapeutic Goal(s): Identify and demonstrate safe technique for sit/stand transfers from various seating surfaces. Identify and demonstrate safe use of assistive devices with basic transfers and simple mobility. Identify and demonstrate ability to recognize signs/symptoms of orthostasis and appropriate compensatory/safety techniques.   Individual Participation: Pt was quietly observant but willing to participate verbally and with various activities with only minimal encouragement.  Pt able to perform five time sit to stand outcome measure and participate in group transfer activity safely with good control and stability.       Participation Level: Moderate   Participation Quality: Minimal Cues   Behavior: Alert, Appropriate, Attentive , Calm, and Cooperative   Speech/Thought Process: Coherent, Focused, Organized, and Relevant   Affect/Mood: Appropriate and Flat   Insight: Moderate   Judgement: Moderate   Individualization: Pt was quietly attentive in their participation of group discussion/activity.   Modes of Intervention: Activity, Discussion, Education, and Problem-solving  Patient Response to Interventions:  Attentive, Engaged, and Interested    Plan: Continue to engage patient in OT groups 1 -  2x/week.  Ovidio Hanger PT, DPT 05/11/23, 2:15 PM

## 2023-05-11 NOTE — Progress Notes (Signed)
   05/11/23 1300  Psych Admission Type (Psych Patients Only)  Admission Status Voluntary  Psychosocial Assessment  Patient Complaints Anxiety  Eye Contact Fair  Facial Expression Sad  Affect Anxious  Speech Logical/coherent  Interaction Minimal  Motor Activity Slow  Appearance/Hygiene In scrubs  Behavior Characteristics Cooperative  Mood Pleasant  Thought Process  Coherency WDL  Content WDL  Delusions WDL  Perception WDL  Hallucination None reported or observed  Judgment Limited  Confusion None  Danger to Self  Current suicidal ideation? Denies  Agreement Not to Harm Self Yes  Description of Agreement verbal  Danger to Others  Danger to Others None reported or observed

## 2023-05-11 NOTE — Plan of Care (Signed)
   05/11/23 0000  Psych Admission Type (Psych Patients Only)  Admission Status Voluntary  Psychosocial Assessment  Patient Complaints Anxiety  Eye Contact Fair  Facial Expression Sad  Affect Anxious  Speech Logical/coherent  Interaction Minimal  Motor Activity Slow  Appearance/Hygiene In scrubs  Behavior Characteristics Cooperative  Mood Pleasant  Thought Process  Coherency WDL  Content WDL  Delusions WDL  Perception WDL  Hallucination None reported or observed  Judgment Limited  Confusion None  Danger to Self  Current suicidal ideation? Denies  Danger to Others  Danger to Others None reported or observed     Problem: Education: Goal: Knowledge of [facility] Education information/materials will improve Outcome: Progressing Goal: Emotional status will improve Outcome: Progressing Goal: Mental status will improve Outcome: Progressing Goal: Verbalization of understanding the information provided will improve Outcome: Progressing

## 2023-05-11 NOTE — Progress Notes (Signed)
 Physical Therapy Treatment Patient Details Name: [name] MRN: [id number] DOB: August 10, 1965 Today's Date: 05/11/2023   History of Present Illness Tery Hoeger is a 58 year old Caucasian male with a history of MDD, anxiety, Crohn's disease, and chronic mesenteric ischemia who presented to ED on 04/13/23 at 0404 for suicide attempt by ingestion of 28 tablets of 10mg  Lexapro at 0200. Patient sent to behavior Med unit with MD assessment including MDD, generalized anxiety disorder, and tremor.    PT Comments  Pt was pleasant and motivated to participate during the session and put forth good effort throughout. Pt participated in transfer training and with the [facility] Balance Test scoring [phone].  Pt demonstrated min instability during some of the more challenging components of the Berg including alternating toe taps and single leg stance but was able to self-correct without physical assist.  Pt will benefit from continued PT services upon discharge to safely address deficits listed in patient problem list for decreased caregiver assistance and eventual return to PLOF.      If plan is discharge home, recommend the following: Assist for transportation;Help with stairs or ramp for entrance;Assistance with cooking/housework;Direct supervision/assist for financial management;Direct supervision/assist for medications management   Can travel by private vehicle     Yes  Equipment Recommendations  Rolling walker (2 wheels)    Recommendations for Other Services       Precautions / Restrictions Precautions Precautions: Fall Precaution/Restrictions Comments: intermittent dizziness Restrictions Weight Bearing Restrictions Per Provider Order: No     Mobility  Bed Mobility Overal bed mobility: Independent             General bed mobility comments: Good speed and control with bed mobility tasks    Transfers Overall transfer level: Independent Equipment used: None                General transfer comment: Steady with good eccentric and concentric control    Ambulation/Gait Ambulation/Gait assistance: Modified independent (Device/Increase time)             General Gait Details: Pt Mod Ind amb throughout the facility with the RW   Stairs             Wheelchair Mobility     Tilt Bed    Modified Rankin (Stroke Patients Only)       Balance     Sitting balance-Leahy Scale: Normal     Standing balance support: No upper extremity supported Standing balance-Leahy Scale: Good                   Standardized Balance Assessment Standardized Balance Assessment : Berg Balance Test Berg Balance Test Sit to Stand: Able to stand without using hands and stabilize independently Standing Unsupported: Able to stand safely 2 minutes Sitting with Back Unsupported but Feet Supported on Floor or Stool: Able to sit safely and securely 2 minutes Stand to Sit: Sits safely with minimal use of hands Transfers: Able to transfer safely, minor use of hands Standing Unsupported with Eyes Closed: Able to stand 10 seconds safely Standing Ubsupported with Feet Together: Able to place feet together independently and stand 1 minute safely From Standing, Reach Forward with Outstretched Arm: Can reach confidently >25 cm (10") From Standing Position, Pick up Object from Floor: Able to pick up shoe safely and easily From Standing Position, Turn to Look Behind Over each Shoulder: Turn sideways only but maintains balance Turn 360 Degrees: Able to turn 360 degrees safely but slowly Standing Unsupported, Alternately Place Feet  on Step/Stool: Able to complete 4 steps without aid with supervision Standing Unsupported, One Foot in Front: Able to place foot ahead of the other independently and hold 30 seconds Standing on One Leg: Tries to lift leg/unable to hold 3 seconds but remains standing independently Total Score: 46        Communication  Communication Communication: No apparent difficulties  Cognition Arousal: Alert Behavior During Therapy: Flat affect   PT - Cognitive impairments: No apparent impairments                         Following commands: Intact      Cueing Cueing Techniques: Verbal cues  Exercises Other Exercises Other Exercises: Sit to stand transfer training for proper sequencing to limit need of UE assist    General Comments        Pertinent Vitals/Pain Pain Assessment Pain Assessment: No/denies pain    Home Living                          Prior Function            PT Goals (current goals can now be found in the care plan section) Progress towards PT goals: Progressing toward goals    Frequency    Min 1X/week      PT Plan      Co-evaluation              AM-PAC PT "6 Clicks" Mobility   Outcome Measure  Help needed turning from your back to your side while in a flat bed without using bedrails?: None Help needed moving from lying on your back to sitting on the side of a flat bed without using bedrails?: None Help needed moving to and from a bed to a chair (including a wheelchair)?: None Help needed standing up from a chair using your arms (e.g., wheelchair or bedside chair)?: None Help needed to walk in hospital room?: None Help needed climbing 3-5 steps with a railing? : None 6 Click Score: 24    End of Session Equipment Utilized During Treatment: Gait belt Activity Tolerance: Patient tolerated treatment well Patient left: in bed Nurse Communication: Mobility status PT Visit Diagnosis: Muscle weakness (generalized) (M62.81);Difficulty in walking, not elsewhere classified (R26.2)     Time: 4132-4401 PT Time Calculation (min) (ACUTE ONLY): 23 min  Charges:    $Therapeutic Activity: 8-22 mins PT General Charges $$ ACUTE PT VISIT: 1 Visit                     D. Scott Hagop Mccollam PT, DPT 05/11/23, 11:29 AM

## 2023-05-12 DIAGNOSIS — F332 Major depressive disorder, recurrent severe without psychotic features: Secondary | ICD-10-CM | POA: Diagnosis not present

## 2023-05-12 NOTE — Progress Notes (Signed)
   05/12/23 2200  Psych Admission Type (Psych Patients Only)  Admission Status Voluntary  Psychosocial Assessment  Patient Complaints Anxiety;Depression  Eye Contact Fair  Facial Expression Sad  Affect Anxious;Depressed  Speech Logical/coherent  Interaction Minimal  Motor Activity Slow  Appearance/Hygiene In scrubs  Behavior Characteristics Cooperative  Mood Pleasant  Thought Process  Coherency WDL  Content WDL  Delusions WDL  Perception WDL  Hallucination None reported or observed  Judgment Limited  Confusion None  Danger to Self  Current suicidal ideation? Denies  Self-Injurious Behavior No self-injurious ideation or behavior indicators observed or expressed   Agreement Not to Harm Self Yes  Description of Agreement verbal  Danger to Others  Danger to Others None reported or observed

## 2023-05-12 NOTE — BHH Counselor (Signed)
 [name] at [facility] sent CSW email stating she has not reviewed pt's case with supervisor at this time.   Reynaldo Minium, [name], [place] 05/12/2023 4:16 PM

## 2023-05-12 NOTE — Plan of Care (Signed)
   Problem: Coping: Goal: Ability to verbalize frustrations and anger appropriately will improve Outcome: Progressing Goal: Ability to demonstrate self-control will improve Outcome: Progressing

## 2023-05-12 NOTE — BHH Counselor (Signed)
 Pt completed his admission interview with Yazmin at [facility] this morning at 10:00 AM.    CSW sent email to [facility] to inquire if/when pt could be admitted.   Reynaldo Minium, [name], [place] 05/12/2023 3:32 PM

## 2023-05-12 NOTE — Plan of Care (Signed)
   05/12/23 0300  Psych Admission Type (Psych Patients Only)  Admission Status Voluntary  Psychosocial Assessment  Patient Complaints Anxiety  Eye Contact Fair  Facial Expression Sad  Affect Anxious  Speech Logical/coherent  Interaction Minimal  Motor Activity Slow  Appearance/Hygiene In scrubs  Behavior Characteristics Cooperative  Mood Pleasant  Thought Process  Coherency WDL  Content WDL  Delusions WDL  Perception WDL  Hallucination None reported or observed  Judgment Limited  Confusion None  Danger to Self  Current suicidal ideation? Denies  Danger to Others  Danger to Others None reported or observed   Problem: Education: Goal: Knowledge of [facility] Education information/materials will improve Outcome: Progressing Goal: Emotional status will improve Outcome: Progressing Goal: Mental status will improve Outcome: Progressing Goal: Verbalization of understanding the information provided will improve Outcome: Progressing

## 2023-05-12 NOTE — Progress Notes (Signed)
 [facility] MD Progress Note  05/12/2023  [name]  MRN:  [id number]  [name] is a 58 year old Caucasian male with a history of MDD, anxiety, Crohn's disease, and chronic mesenteric ischemia who presented to [facility] emergency department on 04/13/23 at 0404 for suicide attempt by ingestion of 28 tablets of 10mg  Lexapro at 0200. Per ED documentation, patient had some mild symptoms of serotonin syndrome on arrival, including acute agitation and clonus, with symptomatic improvement with benzodiazepines. Did have a return of symptoms requiring additional dose of lorazepam. Poison control advised the total amount of ingested Lexapro likely is not enough to cause serotonin syndrome by itself and they recommended 8-hour cardiac monitoring. Qtc 498.  Subjective:  Chart reviewed, case discussed in multidisciplinary meeting, patient seen during rounds.   Patient is noted to be resting in his bed.  He reports having a good morning and had a better day yesterday.  He did acknowledge that he has an interview with the facility in [place] today at 10:00.  He remains positive about his transition but has some residual anxiety.  He rates his depression and anxiety as 5 out of 10, 10 being the worst.  He denies having any panic attacks.  He consistently denies SI/HI/intent/plan.  He denies auditory/visual hallucinations.  His appetite has improved and he is more mobile on the unit with his walker.   Appetite:  Fair  Past Psychiatric History: see h&P Family History:  Family History  Problem Relation Age of Onset   Heart disease Father    Thyroid cancer Father    Allergies Father    Clotting disorder Father    Breast cancer Mother    Stomach cancer Mother    Colon cancer Neg Hx    Esophageal cancer Neg Hx    Rectal cancer Neg Hx    Social History:  Social History   Substance and Sexual Activity  Alcohol Use No   Alcohol/week: 0.0 standard drinks of alcohol     Social History    Substance and Sexual Activity  Drug Use Not Currently   Types: Cocaine, Marijuana   Comment: QUIT USING DRUGS IN 1997    Social History   Socioeconomic History   Marital status: Single    Spouse name: Not on file   Number of children: 1   Years of education: Not on file   Highest education level: Not on file  Occupational History   Occupation: Disability   Tobacco Use   Smoking status: Former    Current packs/day: 0.00    Average packs/day: 1 pack/day for 15.0 years (15.0 ttl pk-yrs)    Types: Cigarettes    Start date: 03/28/1982    Quit date: 03/28/1997    Years since quitting: 26.1   Smokeless tobacco: Never  Vaping Use   Vaping status: Never Used  Substance and Sexual Activity   Alcohol use: No    Alcohol/week: 0.0 standard drinks of alcohol   Drug use: Not Currently    Types: Cocaine, Marijuana    Comment: QUIT USING DRUGS IN 1997   Sexual activity: Not Currently  Other Topics Concern   Not on file  Social History Narrative   Single, history of substance abuse in recovery   Previously occupied on medical disability   1 child  Daughter Wellsite geologist, lives and works in [place]   Elderly parents involved and he helps them   1 brother   Social Drivers of Corporate investment banker Strain: Low Risk  (  08/28/2019)   Overall Financial Resource Strain (CARDIA)    Difficulty of Paying Living Expenses: Not hard at all  Food Insecurity: Food Insecurity Present (04/13/2023)   Hunger Vital Sign    Worried About Running Out of Food in the Last Year: Never true    Ran Out of Food in the Last Year: Sometimes true  Transportation Needs: No Transportation Needs (04/13/2023)   PRAPARE - Administrator, Civil Service (Medical): No    Lack of Transportation (Non-Medical): No  Physical Activity: Insufficiently Active (11/08/2019)   Exercise Vital Sign    Days of Exercise per Week: 3 days    Minutes of Exercise per Session: 30 min  Stress: Stress Concern Present  (08/28/2019)   [employer] of Occupational Health - Occupational Stress Questionnaire    Feeling of Stress : Very much  Social Connections: Socially Isolated (03/28/2023)   Social Connection and Isolation Panel [NHANES]    Frequency of Communication with Friends and Family: Three times a week    Frequency of Social Gatherings with Friends and Family: Twice a week    Attends Religious Services: Never    Database administrator or Organizations: No    Attends Engineer, structural: Never    Marital Status: Divorced   Past Medical History:  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 06/15/2018   Cancer of skin of neck    Cataract    Chronic mesenteric ischemia ([name])    Chronic pain syndrome 07/09/2016   Crohn's disease of small and large intestines ([name])    followed by dr Baldo Ash gessner   Dairy product intolerance    Diarrhea, functional    Family history of adverse reaction to anesthesia    Grover's disease    transient acantholytic dermatosis   History of alcohol abuse    History of basal cell carcinoma excision    2013 left leg   History of Clostridium difficile    10/ 2014   History of multiple concussions    x6   last one Jan 2017 per pt--  no residual   History of substance abuse ([name])    quit 1997 per pt   History of suicide attempt    05-18-2012  overdose /  failure to thrive   Iron deficiency anemia due to chronic blood loss    Major depression, recurrent, chronic ([name])    Osteopenia    Personal history of adenomatous colonic polyps 12/2010, 03/2012   12/2010 - 8 mm serrated adenoma of rectum   Portal vein thrombosis 03/21/2015   right   Primary sclerosing cholangitis    ? 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   Substance abuse ([name]) 1997   Alcohol   Vitamin A deficiency 05/23/2018   Vitamin B6 deficiency 07/27/2018   Vitamin C deficiency 05/23/2018    Past Surgical History:  Procedure Laterality Date   ABDOMINAL AORTAGRAM N/A 03/29/2012   Procedure: ABDOMINAL Ronny Flurry;  Surgeon: Nada Libman, MD;  Location: [facility] CATH LAB;  Service: Cardiovascular;  Laterality: N/A;   ADENOIDECTOMY  age 49   BIOPSY  05/31/2018  Procedure: BIOPSY;  Surgeon: Rachael Fee, MD;  Location: [facility] ENDOSCOPY;  Service: Endoscopy;;   CATARACT EXTRACTION W/ INTRAOCULAR LENS  IMPLANT, BILATERAL  2009   COLONOSCOPY  2001, 05/02/2003, 01/28/11   2012: Right colon Crohn's, rectal polyp   COLONOSCOPY  03/31/2012   Procedure: COLONOSCOPY;  Surgeon: Beverley Fiedler, MD;  Location: [facility] ENDOSCOPY;  Service: Gastroenterology;  Laterality: N/A;   COLONOSCOPY WITH PROPOFOL N/A 05/31/2018   Procedure: COLONOSCOPY WITH PROPOFOL;  Surgeon: Rachael Fee, MD;  Location: [facility] ENDOSCOPY;  Service: Endoscopy;  Laterality: N/A;   ESOPHAGOGASTRODUODENOSCOPY  01/28/2011   Normal   FOOT SURGERY Right age 46   MOHS SURGERY Left 11/2013   left ankle parakerotosis    PERCUTANEOUS LIVER BIOPSY  2007 and 2008   PILONIDAL CYST EXCISION  age 17   PLACEMENT OF SETON N/A 11/06/2015   Procedure: PLACEMENT OF SETON;  Surgeon: Romie Levee, MD;  Location: [facility];  Service: General;  Laterality: N/A;   PLACEMENT OF SETON  07/2018   at [facility]   RECTAL EXAM UNDER ANESTHESIA N/A 01/18/2019   Procedure: ANAL EXAM UNDER ANESTHESIA,  INCISION AND DRAINAGE, SETON PLACEMENT;  Surgeon: Romie Levee, MD;  Location: [facility];  Service: General;  Laterality: N/A;   UPPER GASTROINTESTINAL ENDOSCOPY      Current Medications: Current Facility-Administered Medications  Medication Dose Route Frequency Provider Last Rate Last Admin   acetaminophen (TYLENOL) tablet 650  mg  650 mg Oral Q6H PRN Motley-Mangrum, Jadeka A, PMHNP       alum & mag hydroxide-simeth (MAALOX/MYLANTA) 200-200-20 MG/5ML suspension 30 mL  30 mL Oral Q4H PRN Motley-Mangrum, Jadeka A, PMHNP       ARIPiprazole (ABILIFY) tablet 5 mg  5 mg Oral QHS Verner Chol, MD   5 mg at 05/11/23 2048   doxycycline (VIBRA-TABS) tablet 100 mg  100 mg Oral Q12H Floydene Flock, MD   100 mg at 05/12/23 0801   feeding supplement (ENSURE ENLIVE / ENSURE PLUS) liquid 237 mL  1 Bottle Oral TID BM Verner Chol, MD   237 mL at 05/11/23 1424   hydrOXYzine (ATARAX) tablet 25 mg  25 mg Oral TID PRN Motley-Mangrum, Jadeka A, PMHNP   25 mg at 05/11/23 2047   liver oil-zinc oxide (DESITIN) 40 % ointment   Topical Q12H PRN Verner Chol, MD   Given at 05/04/23 0803   LORazepam (ATIVAN) injection 1 mg  1 mg Intramuscular Q4H PRN Remington, Amber E, NP       LORazepam (ATIVAN) injection 2 mg  2 mg Intramuscular TID PRN Motley-Mangrum, Jadeka A, PMHNP       LORazepam (ATIVAN) tablet 1 mg  1 mg Oral Q4H PRN Remington, Amber E, NP   1 mg at 05/12/23 2536   magnesium hydroxide (MILK OF MAGNESIA) suspension 30 mL  30 mL Oral Daily PRN Motley-Mangrum, Jadeka A, PMHNP       multivitamin with minerals tablet 1 tablet  1 tablet Oral Daily Verner Chol, MD   1 tablet at 05/12/23 0801   neomycin-bacitracin-polymyxin [phone] OINT   Topical Q6H PRN Lewanda Rife, MD   1 Application at 05/11/23 2109   ondansetron (ZOFRAN) injection 4 mg  4 mg Intravenous Q6H PRN Verner Chol, MD   4 mg at 04/16/23 0905   ondansetron (ZOFRAN-ODT) disintegrating tablet 4 mg  4 mg Oral Q8H PRN Verner Chol, MD       predniSONE (DELTASONE) tablet 30 mg  30 mg  Oral Q breakfast Motley-Mangrum, Jadeka A, PMHNP   30 mg at 05/12/23 0801   venlafaxine XR (EFFEXOR-XR) 24 hr capsule 150 mg  150 mg Oral Q breakfast Lewanda Rife, MD   150 mg at 05/12/23 0801        Blood Alcohol level:  Lab Results  Component Value Date   ETH <10  04/13/2023   ETH <10 03/28/2023    Metabolic Disorder Labs: Lab Results  Component Value Date   HGBA1C 4.8 04/03/2023   MPG 91.06 04/03/2023   No results found for: "PROLACTIN" Lab Results  Component Value Date   CHOL 209 (H) 04/03/2023   TRIG 75 04/03/2023   HDL 65 04/03/2023   CHOLHDL 3.2 04/03/2023   VLDL 15 04/03/2023   LDLCALC 129 (H) 04/03/2023    Psychiatric Specialty Exam:  Presentation  General Appearance:  Appropriate for Environment; Casual   Eye Contact: Fair   Speech: Clear and Coherent   Speech Volume: Normal       Mood and Affect  Mood: "Fine"   Affect: Less constricted     Thought Process  Thought Processes: Coherent   Descriptions of Associations:Intact   Orientation:Full (Time, Place and Person)   Thought Content:Logical   Hallucinations:Hallucinations: None   Ideas of Reference:None   Suicidal Thoughts:Suicidal Thoughts: No   Homicidal Thoughts:Homicidal Thoughts: No     Sensorium  Memory: Immediate Fair; Recent Fair; Remote Fair   Judgment: Impaired   Insight: Shallow     Executive Functions  Concentration: Fair   Attention Span: Fair   Recall: [employer] of Knowledge: Fair   Language: Fair     Psychomotor Activity  Psychomotor Activity: Psychomotor Activity: Normal   Musculoskeletal: Strength & Muscle Tone: decreased Gait & Station: Improved, patient walks with the help of a walker Assets  Assets: Manufacturing systems engineer; Desire for Improvement; Resilience   Physical Exam: Physical Exam Vitals and nursing note reviewed.  HENT:     Head: Normocephalic.     Nose: No congestion.     Mouth/Throat:     Mouth: Mucous membranes are moist.  Eyes:     Pupils: Pupils are equal, round, and reactive to light.  Cardiovascular:     Rate and Rhythm: Normal rate.     Pulses: Normal pulses.  Pulmonary:     Breath sounds: Normal breath sounds.  Abdominal:     General: Bowel sounds are normal.   Neurological:     General: No focal deficit present.     Mental Status: He is alert.    Review of Systems  Constitutional:  Positive for malaise/fatigue.  HENT: Negative.    Eyes: Negative.   Cardiovascular: Negative.   Gastrointestinal: Negative.   Skin: Negative.   Blood pressure 102/74, pulse 73, temperature 98.1 F (36.7 C), resp. rate 18, height 6\' 1"  (1.854 m), weight 60.6 kg, SpO2 99%. Body mass index is 17.61 kg/m.  Diagnosis: Principal Problem:   MDD (major depressive disorder), recurrent severe, without psychosis ([name]) Active Problems:   GAD (generalized anxiety disorder)   Tremor  Clinical decision making-patient is currently admitted after a lethal suicide attempt by overdosing on her antidepressants.  Patient is noted to have disabling physical problems including Crohn's disease, chronic pain, physical weakness.  Patient continues to struggle with hopelessness, anhedonia, severe anxiety and panic attacks.  His suicidal ideation have improved to intermittent occurrence and denies any hallucinations.  We continue to adjust the medications to help with his treatment resistant depression.  He continues to need inpatient psychiatric hospitalization. PLAN: Safety and Monitoring:  -- Involuntary admission to inpatient psychiatric unit for safety, stabilization and treatment  -- Daily contact with patient to assess and evaluate symptoms and progress in treatment  -- Patient's case to be discussed in multi-disciplinary team meeting  -- Observation Level : q15 minute checks  -- Vital signs:  q12 hours  -- Precautions: suicide, elopement, and assault -- Encouraged patient to participate in unit milieu and in scheduled group therapies  2. Psychiatric Diagnoses and Treatment:  Effexor Xr 150 mg daily Continue Abilify 5 mg at bedtime for mood stabilization given the intensity of depression.  Discussed thoroughly the side effects risk benefits of Abilify and patient gave consent for  the medication.    3. Medical Issues Being Addressed:  None today   4. Discharge Planning:   -- Social work and case management to assist with discharge planning and identification of hospital follow-up needs prior to discharge  -- Discharge date to be determined, Child psychotherapist and patient's family is working on home health care services  Verner Chol, MD

## 2023-05-13 DIAGNOSIS — F332 Major depressive disorder, recurrent severe without psychotic features: Secondary | ICD-10-CM | POA: Diagnosis not present

## 2023-05-13 NOTE — Progress Notes (Signed)
 Physical Therapy Treatment Patient Details Name: [name] MRN: [id number] DOB: 1965/10/26 Today's Date: 05/13/2023   History of Present Illness [name] is a 58 year old Caucasian male with a history of MDD, anxiety, Crohn's disease, and chronic mesenteric ischemia who presented to ED on 04/13/23 at 0404 for suicide attempt by ingestion of 28 tablets of 10mg  Lexapro at 0200. Patient sent to behavior Med unit with MD assessment including MDD, generalized anxiety disorder, and tremor.    PT Comments  Pt was pleasant and motivated to participate during the session and put forth good effort throughout. Corner balance exercise education/training with visual demonstration followed by PT lead practice followed by pt lead practice with focus on progressing difficulty through changes in foot position, EO vs EC, and head still vs head turns; emphasis placed on safety with proper set up/positioning and use of spotter at all times.  Pt able to demonstrate good carryover when leading session related to finding combinations of above variables that were appropriately challenging to facilitate goal of improved balance.  Pt reported no adverse symptoms during the session with SpO2 and HR WNL throughout.      If plan is discharge home, recommend the following: Assist for transportation;Help with stairs or ramp for entrance;Assistance with cooking/housework;Direct supervision/assist for financial management;Direct supervision/assist for medications management   Can travel by private vehicle     Yes  Equipment Recommendations  Rolling walker (2 wheels)    Recommendations for Other Services       Precautions / Restrictions Precautions Precautions: Fall Precaution/Restrictions Comments: intermittent dizziness Restrictions Weight Bearing Restrictions Per Provider Order: No     Mobility  Bed Mobility Overal bed mobility: Independent             General bed mobility comments: Good  speed and control with bed mobility tasks    Transfers Overall transfer level: Independent Equipment used: None               General transfer comment: Steady with good eccentric and concentric control    Ambulation/Gait Ambulation/Gait assistance: Modified independent (Device/Increase time)   Assistive device: Rolling walker (2 wheels)   Gait velocity: decreased     General Gait Details: Pt Mod Ind amb in the room during the session   Stairs             Wheelchair Mobility     Tilt Bed    Modified Rankin (Stroke Patients Only)       Balance Overall balance assessment: Mild deficits observed, not formally tested   Sitting balance-Leahy Scale: Normal     Standing balance support: No upper extremity supported Standing balance-Leahy Scale: Good                              Communication Communication Communication: No apparent difficulties  Cognition Arousal: Alert Behavior During Therapy: Flat affect   PT - Cognitive impairments: No apparent impairments                         Following commands: Intact      Cueing Cueing Techniques: Verbal cues, Visual cues  Exercises Other Exercises Other Exercises: Corner balance exercise education/training with visual demonstration followed by PT lead practice followed by pt lead practice with focus on progressing difficulty through changes in foot position, EO vs EC, and head still vs head turns; emphasis placed on safety with proper set up/positioning and use  of spotter    General Comments        Pertinent Vitals/Pain Pain Assessment Pain Assessment: No/denies pain    Home Living                          Prior Function            PT Goals (current goals can now be found in the care plan section) Progress towards PT goals: Progressing toward goals    Frequency    Min 1X/week      PT Plan      Co-evaluation              AM-PAC PT "6 Clicks"  Mobility   Outcome Measure  Help needed turning from your back to your side while in a flat bed without using bedrails?: None Help needed moving from lying on your back to sitting on the side of a flat bed without using bedrails?: None Help needed moving to and from a bed to a chair (including a wheelchair)?: None Help needed standing up from a chair using your arms (e.g., wheelchair or bedside chair)?: None Help needed to walk in hospital room?: None Help needed climbing 3-5 steps with a railing? : None 6 Click Score: 24    End of Session Equipment Utilized During Treatment: Gait belt Activity Tolerance: Patient tolerated treatment well Patient left: in bed Nurse Communication: Mobility status PT Visit Diagnosis: Muscle weakness (generalized) (M62.81);Difficulty in walking, not elsewhere classified (R26.2)     Time: 1610-9604 PT Time Calculation (min) (ACUTE ONLY): 28 min  Charges:    $Therapeutic Exercise: 23-37 mins PT General Charges $$ ACUTE PT VISIT: 1 Visit                     D. Elly Modena PT, DPT 05/13/23, 3:41 PM

## 2023-05-13 NOTE — Group Note (Signed)
 Therapy Group Note  Group Topic:Other Neurographic Art Group Date: 05/13/2023 Start Time: 1300 End Time: 1350 Facilitators: Lottie Mussel, OT    Group Description: Group participated with Neurographic art activity, using watercolor paints to facilitate creative expression and meditation/relaxation for each individual.  Incorporated bimanual coordination, mental focus, emotional processing, task/command following and relaxation techniques as appropriate.  Patients engaged socially with therapist and other group participants throughout session. Allowed to ask questions as appropriate, and encouraged to identify ways they could use/share their creations with themselves and others.   Therapeutic Goal(s): Demonstrate ability to independently manipulate utensils required to participate with and complete activity. Demonstrate ability to cognitively focus on task and follow commands necessary for completion. Demonstrate use of art as an outlet for emotional processing and expression. Identify and demonstrate importance of relaxation, neural calming and meditation for improved participation with life groups.   Individual Participation: Pt reserved but engaged, interactive when prompted. Shared how he interpreted the instructions and his use of color. Pt demonstrating improved mood as compared to previous OT group sessions.    Participation Level: Active and Engaged   Participation Quality: Independent   Behavior: Alert, Appropriate, Attentive , Calm, Cooperative, Interactive , Oriented , and Reserved   Speech/Thought Process: Coherent, Focused, Organized, and Relevant   Affect/Mood: Appropriate, Depressed, and Stable    Insight: Fair   Judgement: Fair   Modes of Intervention: Activity, Clarification, Discussion, Education, Exploration, Problem-solving, and Socialization  Patient Response to Interventions:  Attentive, Engaged, Interested , and Receptive   Plan: Continue to engage patient  in OT groups 1-2x/week.   Arman Filter., MPH, MS, OTR/L ascom [phone] 05/13/23, 3:00 PM

## 2023-05-13 NOTE — Group Note (Signed)
 Date:  05/13/2023 Time:  10:14 PM  Group Topic/Focus:  Goals Group:   The focus of this group is to help patients establish daily goals to achieve during treatment and discuss how the patient can incorporate goal setting into their daily lives to aide in recovery.    Participation Level:  Active  Participation Quality:  Appropriate and Attentive  Affect:  Appropriate  Cognitive:  Alert and Appropriate  Insight: Appropriate and Good  Engagement in Group:  Engaged  Modes of Intervention:  Discussion, Rapport Building, Socialization, and Support  Additional Comments:     Romy Ipock 05/13/2023, 10:14 PM

## 2023-05-13 NOTE — Progress Notes (Signed)
 Occupational Therapy Treatment & Discharge from Acute OT Services Patient Details Name: [name] MRN: [id number] DOB: October 16, 1965 Today's Date: 05/13/2023   History of present illness Isiaha Greenup is a 58 year old Caucasian male with a history of MDD, anxiety, Crohn's disease, and chronic mesenteric ischemia who presented to ED on 04/13/23 at 0404 for suicide attempt by ingestion of 28 tablets of 10mg  Lexapro at 0200. Patient sent to behavior Med unit with MD assessment including MDD, generalized anxiety disorder, and tremor.   OT comments  Pt seen for OT tx. Just prior to OT session, pt attended the  OT group offered in the dayroom and expressed appreciation for that group, as he enjoys artistic activities. OT facilitated problem solving to identify anticipated challenges upon discharge including the car trip with his brother to a facility in [place] for additional care/support. Pt identified 2 strategy to support back pain during the car ride and verbalized intent to utilize 2 additional learned strategies to support back pain including distraction and frequent rest breaks to walk/stretch. Without prompting, pt identified that despite the concerns around his back pain during the car ride, that "something good will come out of this" referring to future admission for additional treatment to a facility in [place]. OT acknowledged pt's ability to find the "good in the bad" and further instructed in strategies to promote more optimistic thinking to support his ability to weather difficult situations. Pt verbalized understanding and expressed appreciation for the idea of a "joy journal" to help. Pt has met all acute OT goals. Continue to recommend further skilled OT services at next venue of care. Will sign off. No additional acute OT needs at this time.       If plan is discharge home, recommend the following:  A little help with bathing/dressing/bathroom;Assistance with  cooking/housework;Assist for transportation;Direct supervision/assist for medications management;Direct supervision/assist for financial management;Supervision due to cognitive status;Help with stairs or ramp for entrance   Equipment Recommendations  Other (comment) (defer to next venue)    Recommendations for Other Services      Precautions / Restrictions Precautions Precautions: Fall Precaution/Restrictions Comments: intermittent dizziness Restrictions Weight Bearing Restrictions Per Provider Order: No       Mobility Bed Mobility Overal bed mobility: Independent                  Transfers Overall transfer level: Independent Equipment used: None Transfers: Sit to/from Stand                   Balance Overall balance assessment: Mild deficits observed, not formally tested Sitting-balance support: Feet supported Sitting balance-Leahy Scale: Normal     Standing balance support: No upper extremity supported Standing balance-Leahy Scale: Good                             ADL either performed or assessed with clinical judgement   ADL                                              Extremity/Trunk Assessment              Vision       Perception     Praxis     Communication Communication Communication: No apparent difficulties   Cognition Arousal: Alert Behavior During Therapy: WFL for tasks assessed/performed, Flat  affect Cognition: No family/caregiver present to determine baseline             OT - Cognition Comments: Pt demo's improved cognition today, able to identify stressors, problem solve with cues, demo's fair to good insight                 Following commands: Intact        Cueing   Cueing Techniques: Verbal cues  Exercises Other Exercises Other Exercises: OT facilitated problem solving to identify anticipated challenges upon discharge including the car trip with his brother to a facility in  [place] for additional care/support. Pt identified 2 strategy to support back pain during the car ride, verbalized intent to utilize 2 additional learned strategies to support back pain including distraction and frequent rest breaks to walk/stretch. Pt identified without prompting that despite the concerns around his back pain during the car ride, that "something good will come out of this" referring to future admission for additional treatment to a facility in [place]. OT acknowledged pt's ability to find the "good in the bad" and further instructed in strategies to promote more optimistic thinking to support his ability to weather difficult situations. Pt expressed appreciation for the idea of a "joy journal" to help.    Shoulder Instructions       General Comments      Pertinent Vitals/ Pain       Pain Assessment Pain Assessment: No/denies pain  Home Living                                          Prior Functioning/Environment              Frequency           Progress Toward Goals  OT Goals(current goals can now be found in the care plan section)  Progress towards OT goals: Progressing toward goals;Goals met/education completed, patient discharged from OT  Acute Rehab OT Goals Patient Stated Goal: keep getting better OT Goal Formulation: All assessment and education complete, DC therapy ADL Goals Additional ADL Goal #1: Pt will identify 2 strategies to support his back pain during his  upcoming trip with his brother to [place] once discharged.  Plan      Co-evaluation                 AM-PAC OT "6 Clicks" Daily Activity     Outcome Measure   Help from another person eating meals?: None Help from another person taking care of personal grooming?: A Little Help from another person toileting, which includes using toliet, bedpan, or urinal?: A Little Help from another person bathing (including washing, rinsing, drying)?: A Little Help  from another person to put on and taking off regular upper body clothing?: None Help from another person to put on and taking off regular lower body clothing?: A Little 6 Click Score: 20    End of Session    OT Visit Diagnosis: Other abnormalities of gait and mobility (R26.89);Muscle weakness (generalized) (M62.81)   Activity Tolerance Patient tolerated treatment well   Patient Left in bed;with call bell/phone within reach;Other (comment) (EOB with PT for session)   Nurse Communication Mobility status        Time: 1401-1420 OT Time Calculation (min): 19 min  Charges: OT General Charges $OT Visit: 1 Visit OT Treatments $Therapeutic Activity: 8-22 mins  Summit Borchardt R.,  MPH, MS, OTR/L ascom [phone] 05/13/23, 4:13 PM

## 2023-05-13 NOTE — Group Note (Signed)
 Date:  05/13/2023 Time:  10:52 AM  Group Topic/Focus:  Overcoming Stress:   The focus of this group is to define stress and help patients assess their triggers.    Participation Level:  Active  Participation Quality:  Appropriate  Affect:  Appropriate  Cognitive:  Appropriate  Insight: Appropriate  Engagement in Group:  Engaged  Modes of Intervention:  Activity   Ardelle Anton 05/13/2023, 10:52 AM

## 2023-05-13 NOTE — Plan of Care (Signed)
 D: Pt alert and oriented. Pt reports experiencing depression however, denies experiencing any anxiety at this time. Pt denies experiencing any pain at this time. Pt denies experiencing any SI/HI, or AVH at this time.   A: Scheduled medications administered to pt, per MD orders. Support and encouragement provided. Frequent verbal contact made. Routine safety checks conducted q15 minutes.   R: No adverse drug reactions noted. Pt verbally contracts for safety at this time. Pt compliant with medications and treatment plan. Pt interacts well with others on the unit. Pt remains safe at this time. Plan of care ongoing.  Problem: Education: Goal: Emotional status will improve Outcome: Progressing   Problem: Activity: Goal: Interest or engagement in activities will improve Outcome: Progressing

## 2023-05-13 NOTE — Progress Notes (Signed)
 [facility] MD Progress Note  05/13/2023  [name]  MRN:  [id number]  [name] is a 58 year old Caucasian male with a history of MDD, anxiety, Crohn's disease, and chronic mesenteric ischemia who presented to [facility] emergency department on 04/13/23 at 0404 for suicide attempt by ingestion of 28 tablets of 10mg  Lexapro at 0200. Per ED documentation, patient had some mild symptoms of serotonin syndrome on arrival, including acute agitation and clonus, with symptomatic improvement with benzodiazepines. Did have a return of symptoms requiring additional dose of lorazepam. Poison control advised the total amount of ingested Lexapro likely is not enough to cause serotonin syndrome by itself and they recommended 8-hour cardiac monitoring. Qtc 498.  Subjective:  Chart reviewed, case discussed in multidisciplinary meeting, patient seen during rounds.  Patient is resting in his bed.  Offers no complaints.  He informed the provider that the interview with the placement went well and that he is waiting to hear their response.  He reports today morning he woke up feeling depressed but his day is getting better with time.  He feels motivated to get out of the room walk around the unit and sit in the groups.  He denies SI/HI/intent/plan.  He denies auditory/visual hallucinations.  Per PT/OT patient is improving with his physical activity.    Appetite:  Fair  Past Psychiatric History: see h&P Family History:  Family History  Problem Relation Age of Onset   Heart disease Father    Thyroid cancer Father    Allergies Father    Clotting disorder Father    Breast cancer Mother    Stomach cancer Mother    Colon cancer Neg Hx    Esophageal cancer Neg Hx    Rectal cancer Neg Hx    Social History:  Social History   Substance and Sexual Activity  Alcohol Use No   Alcohol/week: 0.0 standard drinks of alcohol     Social History   Substance and Sexual Activity  Drug Use Not Currently   Types:  Cocaine, Marijuana   Comment: QUIT USING DRUGS IN 1997    Social History   Socioeconomic History   Marital status: Single    Spouse name: Not on file   Number of children: 1   Years of education: Not on file   Highest education level: Not on file  Occupational History   Occupation: Disability   Tobacco Use   Smoking status: Former    Current packs/day: 0.00    Average packs/day: 1 pack/day for 15.0 years (15.0 ttl pk-yrs)    Types: Cigarettes    Start date: 03/28/1982    Quit date: 03/28/1997    Years since quitting: 26.1   Smokeless tobacco: Never  Vaping Use   Vaping status: Never Used  Substance and Sexual Activity   Alcohol use: No    Alcohol/week: 0.0 standard drinks of alcohol   Drug use: Not Currently    Types: Cocaine, Marijuana    Comment: QUIT USING DRUGS IN 1997   Sexual activity: Not Currently  Other Topics Concern   Not on file  Social History Narrative   Single, history of substance abuse in recovery   Previously occupied on medical disability   1 child  Daughter Wellsite geologist, lives and works in [place]   Elderly parents involved and he helps them   1 brother   Social Drivers of Corporate investment banker Strain: Low Risk  (08/28/2019)   Overall Financial Resource Strain (CARDIA)  Difficulty of Paying Living Expenses: Not hard at all  Food Insecurity: Food Insecurity Present (04/13/2023)   Hunger Vital Sign    Worried About Running Out of Food in the Last Year: Never true    Ran Out of Food in the Last Year: Sometimes true  Transportation Needs: No Transportation Needs (04/13/2023)   PRAPARE - Administrator, Civil Service (Medical): No    Lack of Transportation (Non-Medical): No  Physical Activity: Insufficiently Active (11/08/2019)   Exercise Vital Sign    Days of Exercise per Week: 3 days    Minutes of Exercise per Session: 30 min  Stress: Stress Concern Present (08/28/2019)   [employer] of Occupational Health - Occupational Stress  Questionnaire    Feeling of Stress : Very much  Social Connections: Socially Isolated (03/28/2023)   Social Connection and Isolation Panel [NHANES]    Frequency of Communication with Friends and Family: Three times a week    Frequency of Social Gatherings with Friends and Family: Twice a week    Attends Religious Services: Never    Database administrator or Organizations: No    Attends Engineer, structural: Never    Marital Status: Divorced   Past Medical History:  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 06/15/2018   Cancer of skin of neck    Cataract    Chronic mesenteric ischemia ([name])    Chronic pain syndrome 07/09/2016   Crohn's disease of small and large intestines ([name])    followed by dr Baldo Ash gessner   Dairy product intolerance    Diarrhea, functional    Family history of adverse reaction to anesthesia    Grover's disease    transient acantholytic dermatosis   History of alcohol abuse    History of basal cell carcinoma excision    2013 left leg   History of Clostridium difficile    10/ 2014   History of multiple concussions    x6   last one Jan 2017 per pt--  no residual   History of substance abuse ([name])    quit 1997 per pt   History of suicide attempt    05-18-2012  overdose /  failure to thrive   Iron deficiency anemia due to chronic blood loss    Major depression, recurrent, chronic ([name])    Osteopenia    Personal history of adenomatous colonic polyps 12/2010, 03/2012   12/2010 - 8 mm serrated adenoma of rectum   Portal vein thrombosis 03/21/2015   right   Primary sclerosing cholangitis    ? 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   Substance abuse  ([name]) 1997   Alcohol   Vitamin A deficiency 05/23/2018   Vitamin B6 deficiency 07/27/2018   Vitamin C deficiency 05/23/2018    Past Surgical History:  Procedure Laterality Date   ABDOMINAL AORTAGRAM N/A 03/29/2012   Procedure: ABDOMINAL Ronny Flurry;  Surgeon: Nada Libman, MD;  Location: [facility] CATH LAB;  Service: Cardiovascular;  Laterality: N/A;   ADENOIDECTOMY  age 52   BIOPSY  05/31/2018   Procedure: BIOPSY;  Surgeon: Rachael Fee, MD;  Location:  [facility] ENDOSCOPY;  Service: Endoscopy;;   CATARACT EXTRACTION W/ INTRAOCULAR LENS  IMPLANT, BILATERAL  2009   COLONOSCOPY  2001, 05/02/2003, 01/28/11   2012: Right colon Crohn's, rectal polyp   COLONOSCOPY  03/31/2012   Procedure: COLONOSCOPY;  Surgeon: Beverley Fiedler, MD;  Location: [facility] ENDOSCOPY;  Service: Gastroenterology;  Laterality: N/A;   COLONOSCOPY WITH PROPOFOL N/A 05/31/2018   Procedure: COLONOSCOPY WITH PROPOFOL;  Surgeon: Rachael Fee, MD;  Location: [facility] ENDOSCOPY;  Service: Endoscopy;  Laterality: N/A;   ESOPHAGOGASTRODUODENOSCOPY  01/28/2011   Normal   FOOT SURGERY Right age 27   MOHS SURGERY Left 11/2013   left ankle parakerotosis    PERCUTANEOUS LIVER BIOPSY  2007 and 2008   PILONIDAL CYST EXCISION  age 43   PLACEMENT OF SETON N/A 11/06/2015   Procedure: PLACEMENT OF SETON;  Surgeon: Romie Levee, MD;  Location: [facility];  Service: General;  Laterality: N/A;   PLACEMENT OF SETON  07/2018   at [facility]   RECTAL EXAM UNDER ANESTHESIA N/A 01/18/2019   Procedure: ANAL EXAM UNDER ANESTHESIA,  INCISION AND DRAINAGE, SETON PLACEMENT;  Surgeon: Romie Levee, MD;  Location: [facility];  Service: General;  Laterality: N/A;   UPPER GASTROINTESTINAL ENDOSCOPY      Current Medications: Current Facility-Administered Medications  Medication Dose Route Frequency Provider Last Rate Last Admin   acetaminophen (TYLENOL) tablet 650 mg  650 mg Oral Q6H PRN Motley-Mangrum, Jadeka A, PMHNP       alum & mag  hydroxide-simeth (MAALOX/MYLANTA) 200-200-20 MG/5ML suspension 30 mL  30 mL Oral Q4H PRN Motley-Mangrum, Jadeka A, PMHNP       ARIPiprazole (ABILIFY) tablet 5 mg  5 mg Oral QHS Verner Chol, MD   5 mg at 05/12/23 2116   doxycycline (VIBRA-TABS) tablet 100 mg  100 mg Oral Q12H Floydene Flock, MD   100 mg at 05/13/23 0809   feeding supplement (ENSURE ENLIVE / ENSURE PLUS) liquid 237 mL  1 Bottle Oral TID BM Verner Chol, MD   237 mL at 05/13/23 1415   hydrOXYzine (ATARAX) tablet 25 mg  25 mg Oral TID PRN Motley-Mangrum, Jadeka A, PMHNP   25 mg at 05/13/23 1500   liver oil-zinc oxide (DESITIN) 40 % ointment   Topical Q12H PRN Verner Chol, MD   Given at 05/04/23 0803   LORazepam (ATIVAN) injection 1 mg  1 mg Intramuscular Q4H PRN Remington, Amber E, NP       LORazepam (ATIVAN) injection 2 mg  2 mg Intramuscular TID PRN Motley-Mangrum, Jadeka A, PMHNP       LORazepam (ATIVAN) tablet 1 mg  1 mg Oral Q4H PRN Remington, Amber E, NP   1 mg at 05/13/23 0331   magnesium hydroxide (MILK OF MAGNESIA) suspension 30 mL  30 mL Oral Daily PRN Motley-Mangrum, Jadeka A, PMHNP       multivitamin with minerals tablet 1 tablet  1 tablet Oral Daily Verner Chol, MD   1 tablet at 05/13/23 0809   neomycin-bacitracin-polymyxin [phone] OINT   Topical Q6H PRN Lewanda Rife, MD   1 Application at 05/13/23 0810   ondansetron (ZOFRAN) injection 4 mg  4 mg Intravenous Q6H PRN Verner Chol, MD   4 mg at 04/16/23 0905   ondansetron (ZOFRAN-ODT) disintegrating tablet 4 mg  4 mg Oral Q8H PRN Verner Chol, MD       predniSONE (DELTASONE) tablet 30 mg  30 mg Oral Q breakfast Motley-Mangrum, Ezra Sites, PMHNP   30  mg at 05/13/23 0809   venlafaxine XR (EFFEXOR-XR) 24 hr capsule 150 mg  150 mg Oral Q breakfast Lewanda Rife, MD   150 mg at 05/13/23 0809        Blood Alcohol level:  Lab Results  Component Value Date   ETH <10 04/13/2023   ETH <10 03/28/2023    Metabolic Disorder Labs: Lab Results   Component Value Date   HGBA1C 4.8 04/03/2023   MPG 91.06 04/03/2023   No results found for: "PROLACTIN" Lab Results  Component Value Date   CHOL 209 (H) 04/03/2023   TRIG 75 04/03/2023   HDL 65 04/03/2023   CHOLHDL 3.2 04/03/2023   VLDL 15 04/03/2023   LDLCALC 129 (H) 04/03/2023    Psychiatric Specialty Exam:  Presentation  General Appearance:  Appropriate for Environment; Casual   Eye Contact: Fair   Speech: Clear and Coherent   Speech Volume: Normal       Mood and Affect  Mood: "Fine"   Affect: Less constricted     Thought Process  Thought Processes: Coherent   Descriptions of Associations:Intact   Orientation:Full (Time, Place and Person)   Thought Content:Logical   Hallucinations:Hallucinations: None   Ideas of Reference:None   Suicidal Thoughts:Suicidal Thoughts: No   Homicidal Thoughts:Homicidal Thoughts: No     Sensorium  Memory: Immediate Fair; Recent Fair; Remote Fair   Judgment: Impaired   Insight: Shallow     Executive Functions  Concentration: Fair   Attention Span: Fair   Recall: [employer] of Knowledge: Fair   Language: Fair     Psychomotor Activity  Psychomotor Activity: Psychomotor Activity: Normal   Musculoskeletal: Strength & Muscle Tone: decreased Gait & Station: Improved, patient walks with the help of a walker Assets  Assets: Manufacturing systems engineer; Desire for Improvement; Resilience   Physical Exam: Physical Exam Vitals and nursing note reviewed.  HENT:     Head: Normocephalic.     Nose: No congestion.     Mouth/Throat:     Mouth: Mucous membranes are moist.  Eyes:     Pupils: Pupils are equal, round, and reactive to light.  Cardiovascular:     Rate and Rhythm: Normal rate.     Pulses: Normal pulses.  Pulmonary:     Breath sounds: Normal breath sounds.  Abdominal:     General: Bowel sounds are normal.  Neurological:     General: No focal deficit present.     Mental Status: He is  alert.    Review of Systems  Constitutional:  Positive for malaise/fatigue.  HENT: Negative.    Eyes: Negative.   Cardiovascular: Negative.   Gastrointestinal: Negative.   Skin: Negative.   Blood pressure 106/76, pulse 85, temperature 98.2 F (36.8 C), resp. rate 18, height 6\' 1"  (1.854 m), weight 60.6 kg, SpO2 98%. Body mass index is 17.61 kg/m.  Diagnosis: Principal Problem:   MDD (major depressive disorder), recurrent severe, without psychosis ([name]) Active Problems:   GAD (generalized anxiety disorder)   Tremor  Clinical decision making-patient is currently admitted after a lethal suicide attempt by overdosing on her antidepressants.  Patient is noted to have disabling physical problems including Crohn's disease, chronic pain, physical weakness.  Patient continues to struggle with hopelessness, anhedonia, severe anxiety and panic attacks.  His suicidal ideation have improved to intermittent occurrence and denies any hallucinations.  We continue to adjust the medications to help with his treatment resistant depression.  He continues to need inpatient psychiatric hospitalization. PLAN: Safety and  Monitoring:  -- Involuntary admission to inpatient psychiatric unit for safety, stabilization and treatment  -- Daily contact with patient to assess and evaluate symptoms and progress in treatment  -- Patient's case to be discussed in multi-disciplinary team meeting  -- Observation Level : q15 minute checks  -- Vital signs:  q12 hours  -- Precautions: suicide, elopement, and assault -- Encouraged patient to participate in unit milieu and in scheduled group therapies  2. Psychiatric Diagnoses and Treatment:  Effexor Xr 150 mg daily Continue Abilify 5 mg at bedtime for mood stabilization given the intensity of depression.  Discussed thoroughly the side effects risk benefits of Abilify and patient gave consent for the medication.    3. Medical Issues Being Addressed:  None today   4.  Discharge Planning:   -- Social work and case management to assist with discharge planning and identification of hospital follow-up needs prior to discharge  -- Discharge date to be determined, Child psychotherapist and patient's family is working on home health care services  Verner Chol, MD

## 2023-05-13 NOTE — Progress Notes (Signed)
   05/13/23 0600  15 Minute Checks  Location Bedroom  Visual Appearance Calm  Behavior Sleeping  Sleep (Behavioral Health Patients Only)  Calculate sleep? (Click Yes once per 24 hr at 0600 safety check) Yes  Documented sleep last 24 hours 10.75

## 2023-05-14 DIAGNOSIS — F332 Major depressive disorder, recurrent severe without psychotic features: Secondary | ICD-10-CM | POA: Diagnosis not present

## 2023-05-14 NOTE — Plan of Care (Signed)
 D: Pt alert and oriented. Pt endorses experiencing anxiety/depression at this time. Pt denies experiencing any pain at this time. Pt denies experiencing any SI/HI, or AVH at this time.   A: Scheduled medications administered to pt, per MD orders. Support and encouragement provided. Frequent verbal contact made. Routine safety checks conducted q15 minutes.   R: No adverse drug reactions noted. Pt verbally contracts for safety at this time. Pt compliant with medications and treatment plan. Pt interacts well with others on the unit. Pt remains safe at this time. Plan of care ongoing.  Problem: Education: Goal: Emotional status will improve Outcome: Not Progressing   Problem: Coping: Goal: Ability to verbalize frustrations and anger appropriately will improve Outcome: Progressing

## 2023-05-14 NOTE — Progress Notes (Signed)
 [facility] MD Progress Note  05/14/2023  [name]  MRN:  [id number]  [name] is a 58 year old Caucasian male with a history of MDD, anxiety, Crohn's disease, and chronic mesenteric ischemia who presented to [facility] emergency department on 04/13/23 at 0404 for suicide attempt by ingestion of 28 tablets of 10mg  Lexapro at 0200. Per ED documentation, patient had some mild symptoms of serotonin syndrome on arrival, including acute agitation and clonus, with symptomatic improvement with benzodiazepines. Did have a return of symptoms requiring additional dose of lorazepam. Poison control advised the total amount of ingested Lexapro likely is not enough to cause serotonin syndrome by itself and they recommended 8-hour cardiac monitoring. Qtc 498.  Subjective:  Chart reviewed, case discussed in multidisciplinary meeting, patient seen during rounds.  Patient is noted to be resting in his bed.  He reports that he is just tired but wants to come out of the room eventually.  He is working with PT with no problems.  He has fair appetite and sleep.  He reports his depression and anxiety under control.  Provider and patient discussed about discharge planning and if placement falls through, team can work with his family and his brother can consider independent living with home health and ACT team.  He denies SI/HI/intent/plan.  He denies auditory/visual hallucinations    Appetite:  Fair  Past Psychiatric History: see h&P Family History:  Family History  Problem Relation Age of Onset   Heart disease Father    Thyroid cancer Father    Allergies Father    Clotting disorder Father    Breast cancer Mother    Stomach cancer Mother    Colon cancer Neg Hx    Esophageal cancer Neg Hx    Rectal cancer Neg Hx    Social History:  Social History   Substance and Sexual Activity  Alcohol Use No   Alcohol/week: 0.0 standard drinks of alcohol     Social History   Substance and Sexual Activity   Drug Use Not Currently   Types: Cocaine, Marijuana   Comment: QUIT USING DRUGS IN 1997    Social History   Socioeconomic History   Marital status: Single    Spouse name: Not on file   Number of children: 1   Years of education: Not on file   Highest education level: Not on file  Occupational History   Occupation: Disability   Tobacco Use   Smoking status: Former    Current packs/day: 0.00    Average packs/day: 1 pack/day for 15.0 years (15.0 ttl pk-yrs)    Types: Cigarettes    Start date: 03/28/1982    Quit date: 03/28/1997    Years since quitting: 26.1   Smokeless tobacco: Never  Vaping Use   Vaping status: Never Used  Substance and Sexual Activity   Alcohol use: No    Alcohol/week: 0.0 standard drinks of alcohol   Drug use: Not Currently    Types: Cocaine, Marijuana    Comment: QUIT USING DRUGS IN 1997   Sexual activity: Not Currently  Other Topics Concern   Not on file  Social History Narrative   Single, history of substance abuse in recovery   Previously occupied on medical disability   1 child  Daughter Wellsite geologist, lives and works in [place]   Elderly parents involved and he helps them   1 brother   Social Drivers of Corporate investment banker Strain: Low Risk  (08/28/2019)   Overall Physicist, medical Strain (  CARDIA)    Difficulty of Paying Living Expenses: Not hard at all  Food Insecurity: Food Insecurity Present (04/13/2023)   Hunger Vital Sign    Worried About Running Out of Food in the Last Year: Never true    Ran Out of Food in the Last Year: Sometimes true  Transportation Needs: No Transportation Needs (04/13/2023)   PRAPARE - Administrator, Civil Service (Medical): No    Lack of Transportation (Non-Medical): No  Physical Activity: Insufficiently Active (11/08/2019)   Exercise Vital Sign    Days of Exercise per Week: 3 days    Minutes of Exercise per Session: 30 min  Stress: Stress Concern Present (08/28/2019)   [employer] of  Occupational Health - Occupational Stress Questionnaire    Feeling of Stress : Very much  Social Connections: Socially Isolated (03/28/2023)   Social Connection and Isolation Panel [NHANES]    Frequency of Communication with Friends and Family: Three times a week    Frequency of Social Gatherings with Friends and Family: Twice a week    Attends Religious Services: Never    Database administrator or Organizations: No    Attends Engineer, structural: Never    Marital Status: Divorced   Past Medical History:  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 06/15/2018   Cancer of skin of neck    Cataract    Chronic mesenteric ischemia ([name])    Chronic pain syndrome 07/09/2016   Crohn's disease of small and large intestines ([name])    followed by dr Baldo Ash gessner   Dairy product intolerance    Diarrhea, functional    Family history of adverse reaction to anesthesia    Grover's disease    transient acantholytic dermatosis   History of alcohol abuse    History of basal cell carcinoma excision    2013 left leg   History of Clostridium difficile    10/ 2014   History of multiple concussions    x6   last one Jan 2017 per pt--  no residual   History of substance abuse ([name])    quit 1997 per pt   History of suicide attempt    05-18-2012  overdose /  failure to thrive   Iron deficiency anemia due to chronic blood loss    Major depression, recurrent, chronic ([name])    Osteopenia    Personal history of adenomatous colonic polyps 12/2010, 03/2012   12/2010 - 8 mm serrated adenoma of rectum   Portal vein thrombosis 03/21/2015   right   Primary sclerosing cholangitis    ? hepatitis overlap - liver bx x 2 and MRCP   Seasonal allergies    Steroid-induced  diabetes ([name]) 03/15/2022   Substance abuse ([name]) 1997   Alcohol   Vitamin A deficiency 05/23/2018   Vitamin B6 deficiency 07/27/2018   Vitamin C deficiency 05/23/2018    Past Surgical History:  Procedure Laterality Date   ABDOMINAL AORTAGRAM N/A 03/29/2012   Procedure: ABDOMINAL Ronny Flurry;  Surgeon: Nada Libman, MD;  Location: [facility] CATH LAB;  Service: Cardiovascular;  Laterality: N/A;   ADENOIDECTOMY  age 26   BIOPSY  05/31/2018   Procedure: BIOPSY;  Surgeon: Rob Bunting  P, MD;  Location: [facility] ENDOSCOPY;  Service: Endoscopy;;   CATARACT EXTRACTION W/ INTRAOCULAR LENS  IMPLANT, BILATERAL  2009   COLONOSCOPY  2001, 05/02/2003, 01/28/11   2012: Right colon Crohn's, rectal polyp   COLONOSCOPY  03/31/2012   Procedure: COLONOSCOPY;  Surgeon: Beverley Fiedler, MD;  Location: [facility] ENDOSCOPY;  Service: Gastroenterology;  Laterality: N/A;   COLONOSCOPY WITH PROPOFOL N/A 05/31/2018   Procedure: COLONOSCOPY WITH PROPOFOL;  Surgeon: Rachael Fee, MD;  Location: [facility] ENDOSCOPY;  Service: Endoscopy;  Laterality: N/A;   ESOPHAGOGASTRODUODENOSCOPY  01/28/2011   Normal   FOOT SURGERY Right age 43   MOHS SURGERY Left 11/2013   left ankle parakerotosis    PERCUTANEOUS LIVER BIOPSY  2007 and 2008   PILONIDAL CYST EXCISION  age 61   PLACEMENT OF SETON N/A 11/06/2015   Procedure: PLACEMENT OF SETON;  Surgeon: Romie Levee, MD;  Location: [facility];  Service: General;  Laterality: N/A;   PLACEMENT OF SETON  07/2018   at [facility]   RECTAL EXAM UNDER ANESTHESIA N/A 01/18/2019   Procedure: ANAL EXAM UNDER ANESTHESIA,  INCISION AND DRAINAGE, SETON PLACEMENT;  Surgeon: Romie Levee, MD;  Location: [facility];  Service: General;  Laterality: N/A;   UPPER GASTROINTESTINAL ENDOSCOPY      Current Medications: Current Facility-Administered Medications  Medication Dose Route Frequency Provider Last Rate Last Admin   acetaminophen (TYLENOL) tablet 650 mg  650 mg Oral Q6H PRN  Motley-Mangrum, Jadeka A, PMHNP       alum & mag hydroxide-simeth (MAALOX/MYLANTA) 200-200-20 MG/5ML suspension 30 mL  30 mL Oral Q4H PRN Motley-Mangrum, Jadeka A, PMHNP       ARIPiprazole (ABILIFY) tablet 5 mg  5 mg Oral QHS Verner Chol, MD   5 mg at 05/13/23 2158   doxycycline (VIBRA-TABS) tablet 100 mg  100 mg Oral Q12H Floydene Flock, MD   100 mg at 05/14/23 0845   feeding supplement (ENSURE ENLIVE / ENSURE PLUS) liquid 237 mL  1 Bottle Oral TID BM Verner Chol, MD   237 mL at 05/14/23 2440   hydrOXYzine (ATARAX) tablet 25 mg  25 mg Oral TID PRN Motley-Mangrum, Ezra Sites, PMHNP   25 mg at 05/14/23 0251   liver oil-zinc oxide (DESITIN) 40 % ointment   Topical Q12H PRN Verner Chol, MD   Given at 05/04/23 0803   LORazepam (ATIVAN) injection 1 mg  1 mg Intramuscular Q4H PRN Remington, Amber E, NP       LORazepam (ATIVAN) injection 2 mg  2 mg Intramuscular TID PRN Motley-Mangrum, Jadeka A, PMHNP       LORazepam (ATIVAN) tablet 1 mg  1 mg Oral Q4H PRN Remington, Amber E, NP   1 mg at 05/13/23 2229   magnesium hydroxide (MILK OF MAGNESIA) suspension 30 mL  30 mL Oral Daily PRN Motley-Mangrum, Jadeka A, PMHNP       multivitamin with minerals tablet 1 tablet  1 tablet Oral Daily Verner Chol, MD   1 tablet at 05/14/23 0844   neomycin-bacitracin-polymyxin [phone] OINT   Topical Q6H PRN Lewanda Rife, MD   1 Application at 05/14/23 0852   ondansetron (ZOFRAN) injection 4 mg  4 mg Intravenous Q6H PRN Verner Chol, MD   4 mg at 04/16/23 0905   ondansetron (ZOFRAN-ODT) disintegrating tablet 4 mg  4 mg Oral Q8H PRN Verner Chol, MD       predniSONE (DELTASONE) tablet 30 mg  30 mg Oral Q breakfast Motley-Mangrum, Jadeka A,  PMHNP   30 mg at 05/14/23 0844   venlafaxine XR (EFFEXOR-XR) 24 hr capsule 150 mg  150 mg Oral Q breakfast Lewanda Rife, MD   150 mg at 05/14/23 0844        Blood Alcohol level:  Lab Results  Component Value Date   ETH <10 04/13/2023   ETH <10  03/28/2023    Metabolic Disorder Labs: Lab Results  Component Value Date   HGBA1C 4.8 04/03/2023   MPG 91.06 04/03/2023   No results found for: "PROLACTIN" Lab Results  Component Value Date   CHOL 209 (H) 04/03/2023   TRIG 75 04/03/2023   HDL 65 04/03/2023   CHOLHDL 3.2 04/03/2023   VLDL 15 04/03/2023   LDLCALC 129 (H) 04/03/2023    Psychiatric Specialty Exam:  Presentation  General Appearance:  Appropriate for Environment; Casual   Eye Contact: Fair   Speech: Clear and Coherent   Speech Volume: Normal       Mood and Affect  Mood: "Fine"   Affect: Less constricted     Thought Process  Thought Processes: Coherent   Descriptions of Associations:Intact   Orientation:Full (Time, Place and Person)   Thought Content:Logical   Hallucinations:Hallucinations: None   Ideas of Reference:None   Suicidal Thoughts:Suicidal Thoughts: No   Homicidal Thoughts:Homicidal Thoughts: No     Sensorium  Memory: Immediate Fair; Recent Fair; Remote Fair   Judgment: Impaired   Insight: Shallow     Executive Functions  Concentration: Fair   Attention Span: Fair   Recall: [employer] of Knowledge: Fair   Language: Fair     Psychomotor Activity  Psychomotor Activity: Psychomotor Activity: Normal   Musculoskeletal: Strength & Muscle Tone: decreased Gait & Station: Improved, patient walks with the help of a walker Assets  Assets: Manufacturing systems engineer; Desire for Improvement; Resilience   Physical Exam: Physical Exam Vitals and nursing note reviewed.  HENT:     Head: Normocephalic.     Nose: No congestion.     Mouth/Throat:     Mouth: Mucous membranes are moist.  Eyes:     Pupils: Pupils are equal, round, and reactive to light.  Cardiovascular:     Rate and Rhythm: Normal rate.     Pulses: Normal pulses.  Pulmonary:     Breath sounds: Normal breath sounds.  Abdominal:     General: Bowel sounds are normal.  Neurological:      General: No focal deficit present.     Mental Status: He is alert.    Review of Systems  Constitutional:  Positive for malaise/fatigue.  HENT: Negative.    Eyes: Negative.   Cardiovascular: Negative.   Gastrointestinal: Negative.   Skin: Negative.   Blood pressure 110/75, pulse 74, temperature 98.1 F (36.7 C), resp. rate 18, height 6\' 1"  (1.854 m), weight 60.6 kg, SpO2 98%. Body mass index is 17.61 kg/m.  Diagnosis: Principal Problem:   MDD (major depressive disorder), recurrent severe, without psychosis ([name]) Active Problems:   GAD (generalized anxiety disorder)   Tremor  Clinical decision making-patient is currently admitted after a lethal suicide attempt by overdosing on her antidepressants.  Patient is noted to have disabling physical problems including Crohn's disease, chronic pain, physical weakness.  Patient continues to struggle with hopelessness, anhedonia, severe anxiety and panic attacks.  His suicidal ideation have improved to intermittent occurrence and denies any hallucinations.  We continue to adjust the medications to help with his treatment resistant depression.  He continues to need inpatient psychiatric  hospitalization. PLAN: Safety and Monitoring:  -- Involuntary admission to inpatient psychiatric unit for safety, stabilization and treatment  -- Daily contact with patient to assess and evaluate symptoms and progress in treatment  -- Patient's case to be discussed in multi-disciplinary team meeting  -- Observation Level : q15 minute checks  -- Vital signs:  q12 hours  -- Precautions: suicide, elopement, and assault -- Encouraged patient to participate in unit milieu and in scheduled group therapies  2. Psychiatric Diagnoses and Treatment:  Effexor Xr 150 mg daily Continue Abilify 5 mg at bedtime for mood stabilization given the intensity of depression.  Discussed thoroughly the side effects risk benefits of Abilify and patient gave consent for the  medication.    3. Medical Issues Being Addressed:  None today   4. Discharge Planning:   -- Social work and case management to assist with discharge planning and identification of hospital follow-up needs prior to discharge  -- Discharge date to be determined, Child psychotherapist and patient's family is working on home health care services  Verner Chol, MD

## 2023-05-14 NOTE — Progress Notes (Signed)
   05/13/23 2100  Psych Admission Type (Psych Patients Only)  Admission Status Voluntary  Psychosocial Assessment  Patient Complaints Depression  Eye Contact Fair  Facial Expression Flat  Affect Flat  Speech Logical/coherent  Interaction Minimal  Motor Activity Slow  Appearance/Hygiene In scrubs  Behavior Characteristics Cooperative  Mood Anxious  Thought Process  Coherency WDL  Content WDL  Delusions None reported or observed  Perception WDL  Hallucination None reported or observed  Judgment Impaired  Confusion None  Danger to Self  Current suicidal ideation? Denies   Pt received in bed with eyes open. RR even and unlabored. Endorses anxiety. Isolates in room except for meds/meals. PRNs given for anxiety. Did not attend group. Q 15 min checks maintained for safety. Denies SI/HI/AVH. No behavior issues noted. No c/o pain/discomfort noted. Plan of care continued.

## 2023-05-15 DIAGNOSIS — F332 Major depressive disorder, recurrent severe without psychotic features: Secondary | ICD-10-CM | POA: Diagnosis not present

## 2023-05-15 MED ORDER — TRAZODONE HCL 50 MG PO TABS
50.0000 mg | ORAL_TABLET | Freq: Every day | ORAL | Status: DC
  Administered 2023-05-15 – 2023-05-17 (×3): 50 mg via ORAL
  Filled 2023-05-15 (×3): qty 1

## 2023-05-15 NOTE — Plan of Care (Signed)
   Problem: Education: Goal: Knowledge of [facility] Education information/materials will improve Outcome: Progressing Goal: Emotional status will improve Outcome: Progressing Goal: Mental status will improve Outcome: Progressing Goal: Verbalization of understanding the information provided will improve Outcome: Progressing

## 2023-05-15 NOTE — Plan of Care (Signed)
  Problem: Education: Goal: Knowledge of [facility] Education information/materials will improve Outcome: Progressing Goal: Emotional status will improve Outcome: Progressing Goal: Mental status will improve Outcome: Progressing   Problem: Activity: Goal: Interest or engagement in activities will improve Outcome: Progressing   

## 2023-05-15 NOTE — Progress Notes (Signed)
 [facility] MD Progress Note  05/15/2023  [name]  MRN:  [id number]  [name] is a 58 year old Caucasian male with a history of MDD, anxiety, Crohn's disease, and chronic mesenteric ischemia who presented to [facility] emergency department on 04/13/23 at 0404 for suicide attempt by ingestion of 28 tablets of 10mg  Lexapro at 0200. Per ED documentation, patient had some mild symptoms of serotonin syndrome on arrival, including acute agitation and clonus, with symptomatic improvement with benzodiazepines. Did have a return of symptoms requiring additional dose of lorazepam. Poison control advised the total amount of ingested Lexapro likely is not enough to cause serotonin syndrome by itself and they recommended 8-hour cardiac monitoring. Qtc 498.  Subjective:  Chart reviewed, case discussed in multidisciplinary meeting, patient seen during rounds.  Patient is noted to be resting in his room.  He reports that last night he did not sleep well and he needed a as needed around 2 AM which helped him eventually to sleep.   He reports ongoing depression and anxiety about his living situation after discharge.  He denies SI/HI/plan.  He denies auditory/visual hallucinations.  Per nursing report patient is more visible on the unit and his mood seems to have improved a lot.    Appetite:  Fair  Past Psychiatric History: see h&P Family History:  Family History  Problem Relation Age of Onset   Heart disease Father    Thyroid cancer Father    Allergies Father    Clotting disorder Father    Breast cancer Mother    Stomach cancer Mother    Colon cancer Neg Hx    Esophageal cancer Neg Hx    Rectal cancer Neg Hx    Social History:  Social History   Substance and Sexual Activity  Alcohol Use No   Alcohol/week: 0.0 standard drinks of alcohol     Social History   Substance and Sexual Activity  Drug Use Not Currently   Types: Cocaine, Marijuana   Comment: QUIT USING DRUGS IN 1997    Social  History   Socioeconomic History   Marital status: Single    Spouse name: Not on file   Number of children: 1   Years of education: Not on file   Highest education level: Not on file  Occupational History   Occupation: Disability   Tobacco Use   Smoking status: Former    Current packs/day: 0.00    Average packs/day: 1 pack/day for 15.0 years (15.0 ttl pk-yrs)    Types: Cigarettes    Start date: 03/28/1982    Quit date: 03/28/1997    Years since quitting: 26.1   Smokeless tobacco: Never  Vaping Use   Vaping status: Never Used  Substance and Sexual Activity   Alcohol use: No    Alcohol/week: 0.0 standard drinks of alcohol   Drug use: Not Currently    Types: Cocaine, Marijuana    Comment: QUIT USING DRUGS IN 1997   Sexual activity: Not Currently  Other Topics Concern   Not on file  Social History Narrative   Single, history of substance abuse in recovery   Previously occupied on medical disability   1 child  Daughter Wellsite geologist, lives and works in [place]   Elderly parents involved and he helps them   1 brother   Social Drivers of Corporate investment banker Strain: Low Risk  (08/28/2019)   Overall Financial Resource Strain (CARDIA)    Difficulty of Paying Living Expenses: Not hard at all  Food Insecurity: Food Insecurity Present (04/13/2023)   Hunger Vital Sign    Worried About Running Out of Food in the Last Year: Never true    Ran Out of Food in the Last Year: Sometimes true  Transportation Needs: No Transportation Needs (04/13/2023)   PRAPARE - Administrator, Civil Service (Medical): No    Lack of Transportation (Non-Medical): No  Physical Activity: Insufficiently Active (11/08/2019)   Exercise Vital Sign    Days of Exercise per Week: 3 days    Minutes of Exercise per Session: 30 min  Stress: Stress Concern Present (08/28/2019)   [employer] of Occupational Health - Occupational Stress Questionnaire    Feeling of Stress : Very much  Social  Connections: Socially Isolated (03/28/2023)   Social Connection and Isolation Panel [NHANES]    Frequency of Communication with Friends and Family: Three times a week    Frequency of Social Gatherings with Friends and Family: Twice a week    Attends Religious Services: Never    Database administrator or Organizations: No    Attends Engineer, structural: Never    Marital Status: Divorced   Past Medical History:  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 06/15/2018   Cancer of skin of neck    Cataract    Chronic mesenteric ischemia ([name])    Chronic pain syndrome 07/09/2016   Crohn's disease of small and large intestines ([name])    followed by dr Baldo Ash gessner   Dairy product intolerance    Diarrhea, functional    Family history of adverse reaction to anesthesia    Grover's disease    transient acantholytic dermatosis   History of alcohol abuse    History of basal cell carcinoma excision    2013 left leg   History of Clostridium difficile    10/ 2014   History of multiple concussions    x6   last one Jan 2017 per pt--  no residual   History of substance abuse ([name])    quit 1997 per pt   History of suicide attempt    05-18-2012  overdose /  failure to thrive   Iron deficiency anemia due to chronic blood loss    Major depression, recurrent, chronic ([name])    Osteopenia    Personal history of adenomatous colonic polyps 12/2010, 03/2012   12/2010 - 8 mm serrated adenoma of rectum   Portal vein thrombosis 03/21/2015   right   Primary sclerosing cholangitis    ? 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   Substance abuse ([name]) 1997   Alcohol   Vitamin A deficiency 05/23/2018    Vitamin B6 deficiency 07/27/2018   Vitamin C deficiency 05/23/2018    Past Surgical History:  Procedure Laterality Date   ABDOMINAL AORTAGRAM N/A 03/29/2012   Procedure: ABDOMINAL Ronny Flurry;  Surgeon: Nada Libman, MD;  Location: [facility] CATH LAB;  Service: Cardiovascular;  Laterality: N/A;   ADENOIDECTOMY  age 2   BIOPSY  05/31/2018   Procedure: BIOPSY;  Surgeon: Rachael Fee, MD;  Location: [facility] ENDOSCOPY;  Service: Endoscopy;;   CATARACT EXTRACTION W/  INTRAOCULAR LENS  IMPLANT, BILATERAL  2009   COLONOSCOPY  2001, 05/02/2003, 01/28/11   2012: Right colon Crohn's, rectal polyp   COLONOSCOPY  03/31/2012   Procedure: COLONOSCOPY;  Surgeon: Beverley Fiedler, MD;  Location: [facility] ENDOSCOPY;  Service: Gastroenterology;  Laterality: N/A;   COLONOSCOPY WITH PROPOFOL N/A 05/31/2018   Procedure: COLONOSCOPY WITH PROPOFOL;  Surgeon: Rachael Fee, MD;  Location: [facility] ENDOSCOPY;  Service: Endoscopy;  Laterality: N/A;   ESOPHAGOGASTRODUODENOSCOPY  01/28/2011   Normal   FOOT SURGERY Right age 78   MOHS SURGERY Left 11/2013   left ankle parakerotosis    PERCUTANEOUS LIVER BIOPSY  2007 and 2008   PILONIDAL CYST EXCISION  age 68   PLACEMENT OF SETON N/A 11/06/2015   Procedure: PLACEMENT OF SETON;  Surgeon: Romie Levee, MD;  Location: [facility] [place];  Service: General;  Laterality: N/A;   PLACEMENT OF SETON  07/2018   at [facility]   RECTAL EXAM UNDER ANESTHESIA N/A 01/18/2019   Procedure: ANAL EXAM UNDER ANESTHESIA,  INCISION AND DRAINAGE, SETON PLACEMENT;  Surgeon: Romie Levee, MD;  Location: [facility];  Service: General;  Laterality: N/A;   UPPER GASTROINTESTINAL ENDOSCOPY      Current Medications: Current Facility-Administered Medications  Medication Dose Route Frequency Provider Last Rate Last Admin   acetaminophen (TYLENOL) tablet 650 mg  650 mg Oral Q6H PRN Motley-Mangrum, Jadeka A, PMHNP       alum & mag hydroxide-simeth (MAALOX/MYLANTA) 200-200-20 MG/5ML  suspension 30 mL  30 mL Oral Q4H PRN Motley-Mangrum, Jadeka A, PMHNP       ARIPiprazole (ABILIFY) tablet 5 mg  5 mg Oral QHS Verner Chol, MD   5 mg at 05/14/23 2124   doxycycline (VIBRA-TABS) tablet 100 mg  100 mg Oral Q12H Floydene Flock, MD   100 mg at 05/15/23 0809   feeding supplement (ENSURE ENLIVE / ENSURE PLUS) liquid 237 mL  1 Bottle Oral TID BM Verner Chol, MD   237 mL at 05/15/23 1015   hydrOXYzine (ATARAX) tablet 25 mg  25 mg Oral TID PRN Motley-Mangrum, Ezra Sites, PMHNP   25 mg at 05/15/23 1610   liver oil-zinc oxide (DESITIN) 40 % ointment   Topical Q12H PRN Verner Chol, MD   Given at 05/04/23 0803   LORazepam (ATIVAN) injection 1 mg  1 mg Intramuscular Q4H PRN Remington, Amber E, NP       LORazepam (ATIVAN) injection 2 mg  2 mg Intramuscular TID PRN Motley-Mangrum, Jadeka A, PMHNP       LORazepam (ATIVAN) tablet 1 mg  1 mg Oral Q4H PRN Remington, Amber E, NP   1 mg at 05/15/23 0220   magnesium hydroxide (MILK OF MAGNESIA) suspension 30 mL  30 mL Oral Daily PRN Motley-Mangrum, Jadeka A, PMHNP       multivitamin with minerals tablet 1 tablet  1 tablet Oral Daily Verner Chol, MD   1 tablet at 05/15/23 0809   neomycin-bacitracin-polymyxin [phone] OINT   Topical Q6H PRN Lewanda Rife, MD   1 Application at 05/15/23 0921   ondansetron (ZOFRAN) injection 4 mg  4 mg Intravenous Q6H PRN Verner Chol, MD   4 mg at 04/16/23 0905   ondansetron (ZOFRAN-ODT) disintegrating tablet 4 mg  4 mg Oral Q8H PRN Verner Chol, MD       predniSONE (DELTASONE) tablet 30 mg  30 mg Oral Q breakfast Motley-Mangrum, Jadeka A, PMHNP   30 mg at 05/15/23 0809   venlafaxine XR (EFFEXOR-XR) 24  hr capsule 150 mg  150 mg Oral Q breakfast Lewanda Rife, MD   150 mg at 05/15/23 0809        Blood Alcohol level:  Lab Results  Component Value Date   ETH <10 04/13/2023   ETH <10 03/28/2023    Metabolic Disorder Labs: Lab Results  Component Value Date   HGBA1C 4.8 04/03/2023    MPG 91.06 04/03/2023   No results found for: "PROLACTIN" Lab Results  Component Value Date   CHOL 209 (H) 04/03/2023   TRIG 75 04/03/2023   HDL 65 04/03/2023   CHOLHDL 3.2 04/03/2023   VLDL 15 04/03/2023   LDLCALC 129 (H) 04/03/2023    Psychiatric Specialty Exam:  Presentation  General Appearance:  Appropriate for Environment; Casual   Eye Contact: Fair   Speech: Clear and Coherent   Speech Volume: Normal       Mood and Affect  Mood: "Fine"   Affect: Less constricted     Thought Process  Thought Processes: Coherent   Descriptions of Associations:Intact   Orientation:Full (Time, Place and Person)   Thought Content:Logical   Hallucinations:Hallucinations: None   Ideas of Reference:None   Suicidal Thoughts:Suicidal Thoughts: No   Homicidal Thoughts:Homicidal Thoughts: No     Sensorium  Memory: Immediate Fair; Recent Fair; Remote Fair   Judgment: Impaired   Insight: Shallow     Executive Functions  Concentration: Fair   Attention Span: Fair   Recall: [employer] of Knowledge: Fair   Language: Fair     Psychomotor Activity  Psychomotor Activity: Psychomotor Activity: Normal   Musculoskeletal: Strength & Muscle Tone: decreased Gait & Station: Improved, patient walks with the help of a walker Assets  Assets: Manufacturing systems engineer; Desire for Improvement; Resilience   Physical Exam: Physical Exam Vitals and nursing note reviewed.  HENT:     Head: Normocephalic.     Nose: No congestion.     Mouth/Throat:     Mouth: Mucous membranes are moist.  Eyes:     Pupils: Pupils are equal, round, and reactive to light.  Cardiovascular:     Rate and Rhythm: Normal rate.     Pulses: Normal pulses.  Pulmonary:     Breath sounds: Normal breath sounds.  Abdominal:     General: Bowel sounds are normal.  Neurological:     General: No focal deficit present.     Mental Status: He is alert.    Review of Systems  Constitutional:   Positive for malaise/fatigue.  HENT: Negative.    Eyes: Negative.   Cardiovascular: Negative.   Gastrointestinal: Negative.   Skin: Negative.   Blood pressure 121/82, pulse 74, temperature (!) 97 F (36.1 C), resp. rate 18, height 6\' 1"  (1.854 m), weight 60.6 kg, SpO2 98%. Body mass index is 17.61 kg/m.  Diagnosis: Principal Problem:   MDD (major depressive disorder), recurrent severe, without psychosis ([name]) Active Problems:   GAD (generalized anxiety disorder)   Tremor  Clinical decision making-patient is currently admitted after a lethal suicide attempt by overdosing on her antidepressants.  Patient is noted to have disabling physical problems including Crohn's disease, chronic pain, physical weakness.  Patient continues to struggle with hopelessness, anhedonia, severe anxiety and panic attacks.  His suicidal ideation have improved to intermittent occurrence and denies any hallucinations.  We continue to adjust the medications to help with his treatment resistant depression.  He continues to need inpatient psychiatric hospitalization. PLAN: Safety and Monitoring:  -- Involuntary admission to inpatient psychiatric unit  for safety, stabilization and treatment  -- Daily contact with patient to assess and evaluate symptoms and progress in treatment  -- Patient's case to be discussed in multi-disciplinary team meeting  -- Observation Level : q15 minute checks  -- Vital signs:  q12 hours  -- Precautions: suicide, elopement, and assault -- Encouraged patient to participate in unit milieu and in scheduled group therapies  2. Psychiatric Diagnoses and Treatment:  Effexor Xr 150 mg daily Continue Abilify 5 mg at bedtime for mood stabilization given the intensity of depression.  Discussed thoroughly the side effects risk benefits of Abilify and patient gave consent for the medication.  Added trazodone 50 mg nightly for sleep  3. Medical Issues Being Addressed:  None today   4. Discharge  Planning:   -- Social work and case management to assist with discharge planning and identification of hospital follow-up needs prior to discharge  -- Discharge date to be determined, Child psychotherapist and patient's family is working on home health care services  Verner Chol, MD

## 2023-05-15 NOTE — Group Note (Signed)
 Date:  05/15/2023 Time:  3:05 PM  Group Topic/Focus:  Outside Rec/Group Movement The purpose of this group is to allow patients to go outside and get fresh air while listening to relaxing music.   Participation Level:  Did Not Attend  Participation Quality:    Affect:    Cognitive:    Insight:   Engagement in Group:    Modes of Intervention:    Additional Comments:  Did not attend   Krysti Hickling T Tyona Nilsen 05/15/2023, 3:05 PM

## 2023-05-15 NOTE — Group Note (Signed)
 Date:  05/15/2023 Time:  9:32 PM  Group Topic/Focus:  Wrap-Up Group:   The focus of this group is to help patients review their daily goal of treatment and discuss progress on daily workbooks.    Participation Level:  Did Not Attend  Participation Quality:      Affect:      Cognitive:      Insight: None  Engagement in Group:      Modes of Intervention:      Additional Comments:    Aaliya Maultsby K Mendy Chou 05/15/2023, 9:32 PM

## 2023-05-15 NOTE — Progress Notes (Signed)
 D: Pt alert and oriented. Pt rates depression 5/10 and anxiety 6/10.  Pt denies experiencing any SI/HI, or AVH at this time.   A: Scheduled medications administered to pt, per MD orders. Support and encouragement provided. Frequent verbal contact made. Routine safety checks conducted q15 minutes.   R: No adverse drug reactions noted. Pt verbally contracts for safety at this time. Pt complaint with medications and treatment plan. Pt interacts well with others on the unit. Pt remains safe at this time. Will continue to monitor.

## 2023-05-15 NOTE — Progress Notes (Signed)
 Patient is pleasant and cooperative.  Endorses anxiety and depression.  Denies SI/HI and AVH.  Denies pain.  Reports he had difficulty sleeping.    Compliant with scheduled medications.  15 min checks in place for safety.  Patient walks the halls and is present in the milieu.  Appropriate interaction with peers and staff.  PRN anxiety medication given x 2.

## 2023-05-15 NOTE — BH IP Treatment Plan (Signed)
 Interdisciplinary Treatment and Diagnostic Plan Update  05/15/2023 Time of Session: 11:30am [name] MRN: [id number]  Principal Diagnosis: MDD (major depressive disorder), recurrent severe, without psychosis ([name])  Secondary Diagnoses: Principal Problem:   MDD (major depressive disorder), recurrent severe, without psychosis ([name]) Active Problems:   GAD (generalized anxiety disorder)   Tremor   Current Medications:  Current Facility-Administered Medications  Medication Dose Route Frequency Provider Last Rate Last Admin   acetaminophen (TYLENOL) tablet 650 mg  650 mg Oral Q6H PRN Motley-Mangrum, Jadeka A, PMHNP       alum & mag hydroxide-simeth (MAALOX/MYLANTA) 200-200-20 MG/5ML suspension 30 mL  30 mL Oral Q4H PRN Motley-Mangrum, Jadeka A, PMHNP       ARIPiprazole (ABILIFY) tablet 5 mg  5 mg Oral QHS Verner Chol, MD   5 mg at 05/14/23 2124   doxycycline (VIBRA-TABS) tablet 100 mg  100 mg Oral Q12H Floydene Flock, MD   100 mg at 05/15/23 0809   feeding supplement (ENSURE ENLIVE / ENSURE PLUS) liquid 237 mL  1 Bottle Oral TID BM Verner Chol, MD   237 mL at 05/15/23 1015   hydrOXYzine (ATARAX) tablet 25 mg  25 mg Oral TID PRN Motley-Mangrum, Ezra Sites, PMHNP   25 mg at 05/15/23 2130   liver oil-zinc oxide (DESITIN) 40 % ointment   Topical Q12H PRN Verner Chol, MD   Given at 05/04/23 0803   LORazepam (ATIVAN) injection 1 mg  1 mg Intramuscular Q4H PRN Remington, Amber E, NP       LORazepam (ATIVAN) injection 2 mg  2 mg Intramuscular TID PRN Motley-Mangrum, Geralynn Ochs A, PMHNP       LORazepam (ATIVAN) tablet 1 mg  1 mg Oral Q4H PRN Remington, Amber E, NP   1 mg at 05/15/23 0220   magnesium hydroxide (MILK OF MAGNESIA) suspension 30 mL  30 mL Oral Daily PRN Motley-Mangrum, Jadeka A, PMHNP       multivitamin with minerals tablet 1 tablet  1 tablet Oral Daily Verner Chol, MD   1 tablet at 05/15/23 0809   neomycin-bacitracin-polymyxin [phone] OINT   Topical Q6H PRN  Lewanda Rife, MD   1 Application at 05/15/23 0921   ondansetron (ZOFRAN) injection 4 mg  4 mg Intravenous Q6H PRN Verner Chol, MD   4 mg at 04/16/23 0905   ondansetron (ZOFRAN-ODT) disintegrating tablet 4 mg  4 mg Oral Q8H PRN Verner Chol, MD       predniSONE (DELTASONE) tablet 30 mg  30 mg Oral Q breakfast Motley-Mangrum, Jadeka A, PMHNP   30 mg at 05/15/23 0809   venlafaxine XR (EFFEXOR-XR) 24 hr capsule 150 mg  150 mg Oral Q breakfast Lewanda Rife, MD   150 mg at 05/15/23 0809   PTA Medications: Medications Prior to Admission  Medication Sig Dispense Refill Last Dose/Taking   busPIRone (BUSPAR) 10 MG tablet Take 1 tablet (10 mg total) by mouth 2 (two) times daily. 30 tablet 0 04/12/2023   dicyclomine (BENTYL) 10 MG capsule Take 1 capsule (10 mg total) by mouth 3 (three) times daily before meals. 90 capsule 0 Taking   escitalopram (LEXAPRO) 10 MG tablet Take 1 tablet (10 mg total) by mouth at bedtime. 30 tablet 0 04/13/2023   ketoconazole (NIZORAL) 2 % cream Apply 1 Application topically daily.   Taking   LORazepam (ATIVAN) 0.5 MG tablet Take 0.5 mg by mouth daily.   Taking   mirtazapine (REMERON) 30 MG tablet Take 1 tablet (30 mg total) by mouth at  bedtime. 30 tablet 0 04/11/2023   predniSONE (DELTASONE) 20 MG tablet Take 1.5 tablets (30 mg total) by mouth daily with breakfast. 100 tablet 2 Past Month    Patient Stressors:    Patient Strengths:    Treatment Modalities: Medication Management, Group therapy, Case management,  1 to 1 session with clinician, Psychoeducation, Recreational therapy.   Physician Treatment Plan for Primary Diagnosis: MDD (major depressive disorder), recurrent severe, without psychosis ([name]) Long Term Goal(s):     Short Term Goals:    Medication Management: Evaluate patient's response, side effects, and tolerance of medication regimen.  Therapeutic Interventions: 1 to 1 sessions, Unit Group sessions and Medication  administration.  Evaluation of Outcomes: Progressing  Physician Treatment Plan for Secondary Diagnosis: Principal Problem:   MDD (major depressive disorder), recurrent severe, without psychosis ([name]) Active Problems:   GAD (generalized anxiety disorder)   Tremor  Long Term Goal(s):     Short Term Goals:       Medication Management: Evaluate patient's response, side effects, and tolerance of medication regimen.  Therapeutic Interventions: 1 to 1 sessions, Unit Group sessions and Medication administration.  Evaluation of Outcomes: Progressing   RN Treatment Plan for Primary Diagnosis: MDD (major depressive disorder), recurrent severe, without psychosis ([name]) Long Term Goal(s): Knowledge of disease and therapeutic regimen to maintain health will improve  Short Term Goals: Ability to remain free from injury will improve, Ability to verbalize frustration and anger appropriately will improve, Ability to demonstrate self-control, Ability to participate in decision making will improve, Ability to verbalize feelings will improve, Ability to disclose and discuss suicidal ideas, Ability to identify and develop effective coping behaviors will improve, and Compliance with prescribed medications will improve  Medication Management: RN will administer medications as ordered by provider, will assess and evaluate patient's response and provide education to patient for prescribed medication. RN will report any adverse and/or side effects to prescribing provider.  Therapeutic Interventions: 1 on 1 counseling sessions, Psychoeducation, Medication administration, Evaluate responses to treatment, Monitor vital signs and CBGs as ordered, Perform/monitor CIWA, COWS, AIMS and Fall Risk screenings as ordered, Perform wound care treatments as ordered.  Evaluation of Outcomes: Progressing   LCSW Treatment Plan for Primary Diagnosis: MDD (major depressive disorder), recurrent severe, without psychosis  ([name]) Long Term Goal(s): Safe transition to appropriate next level of care at discharge, Engage patient in therapeutic group addressing interpersonal concerns.  Short Term Goals: Engage patient in aftercare planning with referrals and resources, Increase social support, Increase ability to appropriately verbalize feelings, Increase emotional regulation, Facilitate acceptance of mental health diagnosis and concerns, Facilitate patient progression through stages of change regarding substance use diagnoses and concerns, Identify triggers associated with mental health/substance abuse issues, and Increase skills for wellness and recovery  Therapeutic Interventions: Assess for all discharge needs, 1 to 1 time with Social worker, Explore available resources and support systems, Assess for adequacy in community support network, Educate family and significant other(s) on suicide prevention, Complete Psychosocial Assessment, Interpersonal group therapy.  Evaluation of Outcomes: Progressing   Progress in Treatment: Attending groups: Yes. Participating in groups: Yes. Taking medication as prescribed: Yes. Toleration medication: Yes. Family/Significant other contact made: Yes, individual(s) contacted:  SPE completed  on 04/18/2023 with patient's brother Hulda Marin [place], [phone]) Patient understands diagnosis: Yes. Discussing patient identified problems/goals with staff: Yes. Medical problems stabilized or resolved: Yes. Denies suicidal/homicidal ideation: Yes. Issues/concerns per patient self-inventory: Yes. Other:   New problem(s) identified: No, Describe:   No, Describe:  None identified  Update  04/20/23: No changes at this time Update 04/25/23: No changes at this time Update 04/30/23: No changes at this time Update 05/05/23: No changes at this time Update 05/10/23: No changes at this time. Update: 05/15/2023: No changes at this time.   New Short Term/Long Term Goal(s): elimination of symptoms of  psychosis, medication management for mood stabilization; elimination of SI thoughts; development of comprehensive mental wellness plan. Update 04/20/23: No changes at this time Update 04/25/23: No changes at this time 3/1/205 none at this time Update 05/05/23: No changes at this time Update 05/10/23: No changes at this time. Update: 05/15/2023: No changes at this time.       Patient Goals:  " I want to get control of my anxiety" Update 04/20/23: No changes at this time Update 04/25/23: No changes at this time 04/30/23 no changes Update 05/05/23: No changes at this time Update 05/10/23: No changes at this time Update 05/10/23: No changes at this time. Update: 05/15/2023: No changes at this time.     Discharge Plan or Barriers: CSW to assist with appropriate discharge planning Update 04/20/23: No changes at this time Update 04/25/23: Pt's brother is assisting him with finding independent living facility. CSW will check with  pt's brother on progress at this time. Provider has suggested ECT as a possibility depression does not seem to be getting better Update 05/05/23: Pt's family wants him to go to [facility], residential treatment center. CSW to assist with admissions process Update 05/10/23: Pt to have admission interview with [facility] at 10:00 AM on Thursday. Update: 05/15/2023: No changes at this time.   Reason for Continuation of Hospitalization: Anxiety medication stabilization, depression   Estimated Length of Stay: 1-7 days Update 05/05/23: TBD Update 05/10/23: TBD Update: 05/15/2023: TBD   Last 3 [place] Suicide Severity Risk Score:  Flowsheet Row Admission (Current) from 04/13/2023 in [facility] BE[facility] Most recent reading at 04/13/2023  7:45 PM ED from 04/13/2023 in [facility] Emergency Department at [facility] Most recent reading at 04/13/2023  4:16 AM ED from 04/11/2023 in [facility] Most recent reading at 04/11/2023  1:11 PM  C-SSRS RISK CATEGORY High Risk High  Risk No Risk       Last PHQ 2/9 Scores:    06/06/2020   11:06 AM 03/06/2020    2:49 PM 08/28/2019    1:04 PM  Depression screen PHQ 2/9  Decreased Interest 0 0 0  Down, Depressed, Hopeless 0 1 1  PHQ - 2 Score 0 1 1  Altered sleeping 1 2 3   Tired, decreased energy 1 2 3   Change in appetite 1 1 1   Feeling bad or failure about yourself  0 0 0  Trouble concentrating 1 2 3   Moving slowly or fidgety/restless 1 0 0  Suicidal thoughts 0 0 0  PHQ-9 Score 5 8 11   Difficult doing work/chores   Somewhat difficult    Scribe for Treatment Team: Rhett Bannister 05/15/2023 11:34 AM

## 2023-05-16 DIAGNOSIS — F332 Major depressive disorder, recurrent severe without psychotic features: Secondary | ICD-10-CM | POA: Diagnosis not present

## 2023-05-16 NOTE — Progress Notes (Signed)
 Physical Therapy Treatment Patient Details Name: [name] MRN: [id number] DOB: 04/26/1965 Today's Date: 05/16/2023   History of Present Illness [name] is a 58 year old Caucasian male with a history of MDD, anxiety, Crohn's disease, and chronic mesenteric ischemia who presented to ED on 04/13/23 at 0404 for suicide attempt by ingestion of 28 tablets of 10mg  Lexapro at 0200. Patient sent to behavior Med unit with MD assessment including MDD, generalized anxiety disorder, and tremor.    PT Comments  Pt was pleasant and motivated to participate during the session and put forth good effort throughout. Pt participated in below balance therex and demonstrated good carryover of proper set-up and progression of corner balance exercises with only occasional min cues needed and reinforcement of importance of having a second person present as a Insurance underwriter during training.  Pt required assist from the walls during corner balance exercises occasionally most notably with narrow BOS combined with EC and with SLS practice.  Pt presented with min to mod sway during balance exercises on compliant surface but was able to self-correct.  Pt with generally slow alternating steps to compliant surface but no LOB.  Pt reported no adverse symptoms during the session with SpO2 and HR WNL throughout on room air.  Pt will benefit from continued PT services upon discharge to safely address deficits listed in patient problem list for decreased caregiver assistance and eventual return to PLOF.       If plan is discharge home, recommend the following: Assist for transportation;Assistance with cooking/housework;Direct supervision/assist for medications management   Can travel by private vehicle     Yes  Equipment Recommendations  Rolling walker (2 wheels)    Recommendations for Other Services       Precautions / Restrictions Precautions Precautions: Fall Precaution/Restrictions Comments: intermittent  dizziness Restrictions Weight Bearing Restrictions Per Provider Order: No     Mobility  Bed Mobility Overal bed mobility: Independent                  Transfers Overall transfer level: Modified independent     Sit to Stand: Modified independent (Device/Increase time)           General transfer comment: Steady with good eccentric and concentric control    Ambulation/Gait Ambulation/Gait assistance: Modified independent (Device/Increase time)   Assistive device: Rolling walker (2 wheels) Gait Pattern/deviations: Step-through pattern       General Gait Details: Pt Mod Ind amb in the room during the session   Stairs             Wheelchair Mobility     Tilt Bed    Modified Rankin (Stroke Patients Only)       Balance Overall balance assessment: Mild deficits observed, not formally tested   Sitting balance-Leahy Scale: Normal       Standing balance-Leahy Scale: Good                              Communication Communication Communication: No apparent difficulties  Cognition Arousal: Alert Behavior During Therapy: WFL for tasks assessed/performed, Flat affect   PT - Cognitive impairments: No apparent impairments                                Cueing Cueing Techniques: Verbal cues  Exercises Other Exercises Other Exercises: Corner balance exercise education/review with pt lead practice with focus on progressing difficulty through  changes in foot position, EO vs EC, and head still vs head turns; emphasis placed on safety with proper set up/positioning and use of spotter Other Exercises: Static standing balance training on compliant foam surface with feet apart, together, and semi-tandem with combinations of EO and EC Other Exercises: Alternating toe taps to compliant surface 1 x 20 slow and controlled and 1 x 10 at pt selected max safe pace    General Comments        Pertinent Vitals/Pain Pain Assessment Pain  Assessment: No/denies pain    Home Living                          Prior Function            PT Goals (current goals can now be found in the care plan section) Progress towards PT goals: Progressing toward goals    Frequency    Min 1X/week      PT Plan      Co-evaluation              AM-PAC PT "6 Clicks" Mobility   Outcome Measure  Help needed turning from your back to your side while in a flat bed without using bedrails?: None Help needed moving from lying on your back to sitting on the side of a flat bed without using bedrails?: None Help needed moving to and from a bed to a chair (including a wheelchair)?: None Help needed standing up from a chair using your arms (e.g., wheelchair or bedside chair)?: None Help needed to walk in hospital room?: None Help needed climbing 3-5 steps with a railing? : None 6 Click Score: 24    End of Session Equipment Utilized During Treatment: Gait belt Activity Tolerance: Patient tolerated treatment well Patient left: in bed Nurse Communication: Mobility status PT Visit Diagnosis: Muscle weakness (generalized) (M62.81);Difficulty in walking, not elsewhere classified (R26.2)     Time: 2956-2130 PT Time Calculation (min) (ACUTE ONLY): 29 min  Charges:    $Therapeutic Exercise: 23-37 mins PT General Charges $$ ACUTE PT VISIT: 1 Visit                    D. Scott Amariyon Maynes PT, DPT 05/16/23, 4:40 PM

## 2023-05-16 NOTE — Group Note (Signed)
 Recreation Therapy Group Note   Group Topic:Health and Wellness  Group Date: 05/16/2023 Start Time: 1520 End Time: 1620 Facilitators: Rosina Lowenstein, LRT, CTRS Location: Courtyard  Group Description: Outdoor Recreation. Patients had the option to play corn hole, ring toss, bowling or listening to music while outside in the courtyard getting fresh air and sunlight. LRT and patients discussed things that they enjoy doing in their free time outside of the hospital. LRT encouraged patients to drink water after being active and getting their heart rate up.   Goal Area(s) Addressed: Patient will identify leisure interests.  Patient will practice healthy decision making. Patient will engage in recreation activity.   Affect/Mood: N/A   Participation Level: Did not attend    Clinical Observations/Individualized Feedback: Patient did not attend group.   Plan: Continue to engage patient in RT group sessions 2-3x/week.   Rosina Lowenstein, LRT, CTRS 05/16/2023 5:13 PM

## 2023-05-16 NOTE — Progress Notes (Signed)
   05/16/23 2300  Psych Admission Type (Psych Patients Only)  Admission Status Voluntary  Psychosocial Assessment  Patient Complaints Anxiety;Sleep disturbance  Eye Contact Fair  Facial Expression Flat  Affect Flat  Speech Logical/coherent  Interaction Assertive  Motor Activity Slow  Appearance/Hygiene In scrubs  Behavior Characteristics Cooperative  Mood Pleasant  Thought Process  Coherency WDL  Content WDL  Delusions None reported or observed  Perception WDL  Hallucination None reported or observed  Judgment Impaired  Confusion None  Danger to Self  Current suicidal ideation? Denies  Agreement Not to Harm Self Yes  Description of Agreement verbal  Danger to Others  Danger to Others None reported or observed

## 2023-05-16 NOTE — Group Note (Signed)
 Recreation Therapy Group Note   Group Topic:Stress Management  Group Date: 05/16/2023 Start Time: 1100 End Time: 1140 Facilitators: Rosina Lowenstein, LRT, CTRS Location:  Dayroom  Group Description: PMR (Progressive Muscle Relaxation). LRT educates patients on what PMR is and the benefits that come from it. Patients are asked to sit with their feet flat on the floor while sitting up and all the way back in their chair, if possible. LRT and pts follow a prompt through a speaker that requires you to tense and release different muscles in their body and focus on their breathing. During session, lights are off and soft music is being played. Pts are given a stress ball to use if needed.  LRT and patients discuss how this technique can be used as a coping skill post-discharge.   Goal Area(s) Addressed:  Patients will be able to describe progressive muscle relaxation.  Patient will practice using relaxation technique. Patient will identify a new coping skill.  Patient will follow multistep directions to reduce anxiety and stress   Affect/Mood: N/A   Participation Level: Did not attend    Clinical Observations/Individualized Feedback: Patient did not attend group.   Plan: Continue to engage patient in RT group sessions 2-3x/week.   Rosina Lowenstein, LRT, CTRS 05/16/2023 1:57 PM

## 2023-05-16 NOTE — Progress Notes (Signed)
 [facility] MD Progress Note  05/16/2023  [name]  MRN:  [id number]  [name] is a 58 year old Caucasian male with a history of MDD, anxiety, Crohn's disease, and chronic mesenteric ischemia who presented to [facility] emergency department on 04/13/23 at 0404 for suicide attempt by ingestion of 28 tablets of 10mg  Lexapro at 0200. Per ED documentation, patient had some mild symptoms of serotonin syndrome on arrival, including acute agitation and clonus, with symptomatic improvement with benzodiazepines. Did have a return of symptoms requiring additional dose of lorazepam. Poison control advised the total amount of ingested Lexapro likely is not enough to cause serotonin syndrome by itself and they recommended 8-hour cardiac monitoring. Qtc 498.  Subjective:  Chart reviewed, case discussed in multidisciplinary meeting, patient seen during rounds. No new acute events overnight. He is concerned about his placement. Patient was provided with support and reassurance. He denies SI/HI/plan.  He denies auditory/visual hallucinations.  Per nursing report patient is more visible on the unit and his mood seems to have improved.  Appetite:  Fair  Past Psychiatric History: see h&P Family History:  Family History  Problem Relation Age of Onset   Heart disease Father    Thyroid cancer Father    Allergies Father    Clotting disorder Father    Breast cancer Mother    Stomach cancer Mother    Colon cancer Neg Hx    Esophageal cancer Neg Hx    Rectal cancer Neg Hx    Social History:  Social History   Substance and Sexual Activity  Alcohol Use No   Alcohol/week: 0.0 standard drinks of alcohol     Social History   Substance and Sexual Activity  Drug Use Not Currently   Types: Cocaine, Marijuana   Comment: QUIT USING DRUGS IN 1997    Social History   Socioeconomic History   Marital status: Single    Spouse name: Not on file   Number of children: 1   Years of education: Not on file    Highest education level: Not on file  Occupational History   Occupation: Disability   Tobacco Use   Smoking status: Former    Current packs/day: 0.00    Average packs/day: 1 pack/day for 15.0 years (15.0 ttl pk-yrs)    Types: Cigarettes    Start date: 03/28/1982    Quit date: 03/28/1997    Years since quitting: 26.1   Smokeless tobacco: Never  Vaping Use   Vaping status: Never Used  Substance and Sexual Activity   Alcohol use: No    Alcohol/week: 0.0 standard drinks of alcohol   Drug use: Not Currently    Types: Cocaine, Marijuana    Comment: QUIT USING DRUGS IN 1997   Sexual activity: Not Currently  Other Topics Concern   Not on file  Social History Narrative   Single, history of substance abuse in recovery   Previously occupied on medical disability   1 child  Daughter Wellsite geologist, lives and works in [place]   Elderly parents involved and he helps them   1 brother   Social Drivers of Corporate investment banker Strain: Low Risk  (08/28/2019)   Overall Financial Resource Strain (CARDIA)    Difficulty of Paying Living Expenses: Not hard at all  Food Insecurity: Food Insecurity Present (04/13/2023)   Hunger Vital Sign    Worried About Running Out of Food in the Last Year: Never true    Ran Out of Food in the Last Year:  Sometimes true  Transportation Needs: No Transportation Needs (04/13/2023)   PRAPARE - Administrator, Civil Service (Medical): No    Lack of Transportation (Non-Medical): No  Physical Activity: Insufficiently Active (11/08/2019)   Exercise Vital Sign    Days of Exercise per Week: 3 days    Minutes of Exercise per Session: 30 min  Stress: Stress Concern Present (08/28/2019)   [employer] of Occupational Health - Occupational Stress Questionnaire    Feeling of Stress : Very much  Social Connections: Socially Isolated (03/28/2023)   Social Connection and Isolation Panel [NHANES]    Frequency of Communication with Friends and Family: Three  times a week    Frequency of Social Gatherings with Friends and Family: Twice a week    Attends Religious Services: Never    Database administrator or Organizations: No    Attends Engineer, structural: Never    Marital Status: Divorced   Past Medical History:  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 06/15/2018   Cancer of skin of neck    Cataract    Chronic mesenteric ischemia ([name])    Chronic pain syndrome 07/09/2016   Crohn's disease of small and large intestines ([name])    followed by dr Baldo Ash gessner   Dairy product intolerance    Diarrhea, functional    Family history of adverse reaction to anesthesia    Grover's disease    transient acantholytic dermatosis   History of alcohol abuse    History of basal cell carcinoma excision    2013 left leg   History of Clostridium difficile    10/ 2014   History of multiple concussions    x6   last one Jan 2017 per pt--  no residual   History of substance abuse ([name])    quit 1997 per pt   History of suicide attempt    05-18-2012  overdose /  failure to thrive   Iron deficiency anemia due to chronic blood loss    Major depression, recurrent, chronic ([name])    Osteopenia    Personal history of adenomatous colonic polyps 12/2010, 03/2012   12/2010 - 8 mm serrated adenoma of rectum   Portal vein thrombosis 03/21/2015   right   Primary sclerosing cholangitis    ? 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   Substance abuse ([name]) 1997   Alcohol   Vitamin A deficiency 05/23/2018   Vitamin B6 deficiency 07/27/2018   Vitamin C deficiency 05/23/2018    Past Surgical History:  Procedure Laterality Date   ABDOMINAL AORTAGRAM N/A  03/29/2012   Procedure: ABDOMINAL Ronny Flurry;  Surgeon: Nada Libman, MD;  Location[facility] CATH LAB;  Service: Cardiovascular;  Laterality: N/A;   ADENOIDECTOMY  age 42   BIOPSY  05/31/2018   Procedure: BIOPSY;  Surgeon: Rachael Fee, MD;  Location: [facility] ENDOSCOPY;  Service: Endoscopy;;   CATARACT EXTRACTION W/ INTRAOCULAR LENS  IMPLANT, BILATERAL  2009   COLONOSCOPY  2001, 05/02/2003, 01/28/11   2012: Right colon Crohn's, rectal polyp   COLONOSCOPY  03/31/2012   Procedure: COLONOSCOPY;  Surgeon: Beverley Fiedler, MD;  Location: [facility] ENDOSCOPY;  Service: Gastroenterology;  Laterality: N/A;   COLONOSCOPY WITH PROPOFOL N/A 05/31/2018   Procedure: COLONOSCOPY WITH PROPOFOL;  Surgeon: Rachael Fee, MD;  Location: [facility] ENDOSCOPY;  Service: Endoscopy;  Laterality: N/A;   ESOPHAGOGASTRODUODENOSCOPY  01/28/2011   Normal   FOOT SURGERY Right age 32   MOHS SURGERY Left 11/2013   left ankle parakerotosis    PERCUTANEOUS LIVER BIOPSY  2007 and 2008   PILONIDAL CYST EXCISION  age 70   PLACEMENT OF SETON N/A 11/06/2015   Procedure: PLACEMENT OF SETON;  Surgeon: Romie Levee, MD;  Location: [facility];  Service: General;  Laterality: N/A;   PLACEMENT OF SETON  07/2018   at [facility]   RECTAL EXAM UNDER ANESTHESIA N/A 01/18/2019   Procedure: ANAL EXAM UNDER ANESTHESIA,  INCISION AND DRAINAGE, SETON PLACEMENT;  Surgeon: Romie Levee, MD;  Location: [facility];  Service: General;  Laterality: N/A;   UPPER GASTROINTESTINAL ENDOSCOPY      Current Medications: Current Facility-Administered Medications  Medication Dose Route Frequency Provider Last Rate Last Admin   acetaminophen (TYLENOL) tablet 650 mg  650 mg Oral Q6H PRN Motley-Mangrum, Jadeka A, PMHNP       alum & mag hydroxide-simeth (MAALOX/MYLANTA) 200-200-20 MG/5ML suspension 30 mL  30 mL Oral Q4H PRN Motley-Mangrum, Jadeka A, PMHNP       ARIPiprazole (ABILIFY) tablet 5 mg  5 mg Oral QHS Verner Chol, MD   5 mg at  05/15/23 2145   doxycycline (VIBRA-TABS) tablet 100 mg  100 mg Oral Q12H Floydene Flock, MD   100 mg at 05/16/23 1002   feeding supplement (ENSURE ENLIVE / ENSURE PLUS) liquid 237 mL  1 Bottle Oral TID BM Verner Chol, MD   237 mL at 05/16/23 1735   hydrOXYzine (ATARAX) tablet 25 mg  25 mg Oral TID PRN Motley-Mangrum, Ezra Sites, PMHNP   25 mg at 05/15/23 1748   liver oil-zinc oxide (DESITIN) 40 % ointment   Topical Q12H PRN Verner Chol, MD   Given at 05/04/23 0803   LORazepam (ATIVAN) injection 1 mg  1 mg Intramuscular Q4H PRN Remington, Amber E, NP       LORazepam (ATIVAN) injection 2 mg  2 mg Intramuscular TID PRN Motley-Mangrum, Jadeka A, PMHNP       LORazepam (ATIVAN) tablet 1 mg  1 mg Oral Q4H PRN Remington, Amber E, NP   1 mg at 05/16/23 1002   magnesium hydroxide (MILK OF MAGNESIA) suspension 30 mL  30 mL Oral Daily PRN Motley-Mangrum, Jadeka A, PMHNP       multivitamin with minerals tablet 1 tablet  1 tablet Oral Daily Verner Chol, MD   1 tablet at 05/16/23 1002   neomycin-bacitracin-polymyxin [phone] OINT   Topical Q6H PRN Lewanda Rife, MD   1 Application at 05/15/23 0921   ondansetron (ZOFRAN) injection 4 mg  4 mg Intravenous Q6H PRN Verner Chol, MD   4 mg at 04/16/23 0905   ondansetron (ZOFRAN-ODT) disintegrating tablet 4 mg  4 mg Oral Q8H PRN Verner Chol, MD       predniSONE (DELTASONE) tablet 30 mg  30 mg Oral Q breakfast Motley-Mangrum, Jadeka A, PMHNP   30 mg at 05/16/23 1002   traZODone (DESYREL) tablet 50 mg  50 mg Oral QHS Verner Chol, MD   50 mg at 05/15/23 2145   venlafaxine XR (EFFEXOR-XR) 24 hr capsule 150 mg  150 mg Oral Q breakfast Lewanda Rife, MD   150  mg at 05/16/23 1002        Blood Alcohol level:  Lab Results  Component Value Date   ETH <10 04/13/2023   ETH <10 03/28/2023    Metabolic Disorder Labs: Lab Results  Component Value Date   HGBA1C 4.8 04/03/2023   MPG 91.06 04/03/2023   No results found for:  "PROLACTIN" Lab Results  Component Value Date   CHOL 209 (H) 04/03/2023   TRIG 75 04/03/2023   HDL 65 04/03/2023   CHOLHDL 3.2 04/03/2023   VLDL 15 04/03/2023   LDLCALC 129 (H) 04/03/2023    Psychiatric Specialty Exam:  Presentation  General Appearance:  Appropriate for Environment; Casual   Eye Contact: Fair   Speech: Clear and Coherent   Speech Volume: Normal       Mood and Affect  Mood: "Fine"   Affect: Stable, more animated     Thought Process  Thought Processes: Coherent   Descriptions of Associations:Intact   Orientation:Full (Time, Place and Person)   Thought Content:Logical   Hallucinations:Hallucinations: None   Ideas of Reference:None   Suicidal Thoughts:Suicidal Thoughts: No   Homicidal Thoughts:Homicidal Thoughts: No     Sensorium  Memory: Immediate Fair; Recent Fair; Remote Fair   Judgment: Fair   Insight: Fair     Art therapist  Concentration: Fair   Attention Span: Fair   Recall: [employer] of Knowledge: Fair   Language: Fair     Psychomotor Activity  Psychomotor Activity: Psychomotor Activity: Normal   Musculoskeletal: Strength & Muscle Tone: decreased Gait & Station: Improved, patient walks with the help of a walker Assets  Assets: Manufacturing systems engineer; Desire for Improvement; Resilience   Physical Exam: Physical Exam Vitals and nursing note reviewed.  HENT:     Head: Normocephalic.     Nose: No congestion.     Mouth/Throat:     Mouth: Mucous membranes are moist.  Eyes:     Pupils: Pupils are equal, round, and reactive to light.  Cardiovascular:     Rate and Rhythm: Normal rate.     Pulses: Normal pulses.  Pulmonary:     Breath sounds: Normal breath sounds.  Abdominal:     General: Bowel sounds are normal.  Neurological:     General: No focal deficit present.     Mental Status: He is alert.    Review of Systems  Constitutional:  Negative for chills and fever.  HENT: Negative.     Eyes: Negative.   Cardiovascular: Negative.   Gastrointestinal: Negative.   Skin: Negative.   Blood pressure 98/67, pulse 85, temperature 98.3 F (36.8 C), resp. rate 17, height 6\' 1"  (1.854 m), weight 60.6 kg, SpO2 98%. Body mass index is 17.61 kg/m.  Diagnosis: Principal Problem:   MDD (major depressive disorder), recurrent severe, without psychosis ([name]) Active Problems:   GAD (generalized anxiety disorder)   Tremor   PLAN: Safety and Monitoring:  -- Involuntary admission to inpatient psychiatric unit for safety, stabilization and treatment  -- Daily contact with patient to assess and evaluate symptoms and progress in treatment  -- Patient's case to be discussed in multi-disciplinary team meeting  -- Observation Level : q15 minute checks  -- Vital signs:  q12 hours  -- Precautions: suicide, elopement, and assault -- Encouraged patient to participate in unit milieu and in scheduled group therapies  2. Psychiatric Diagnoses and Treatment:  Effexor Xr 150 mg daily Continue Abilify 5 mg at bedtime for mood stabilization given the intensity of depression.  Discussed thoroughly the side effects risk benefits of Abilify and patient gave consent for the medication.  Added trazodone 50 mg nightly for sleep  3. Medical Issues Being Addressed:  None today   4. Discharge Planning:   -- Social work and case management to assist with discharge planning and identification of hospital follow-up needs prior to discharge  -- Discharge date to be determined, Child psychotherapist and patient's family is working on home health care services  Hospitalist consult for home health care services  Lewanda Rife, MD

## 2023-05-16 NOTE — Group Note (Signed)
 Date:  05/16/2023 Time:  11:38 PM  Group Topic/Focus:  Wrap-Up Group:   The focus of this group is to help patients review their daily goal of treatment and discuss progress on daily workbooks.    Participation Level:  Active  Participation Quality:  Appropriate  Affect:  Appropriate  Cognitive:  Appropriate  Insight: Appropriate  Engagement in Group:  Engaged  Modes of Intervention:  Discussion  Additional Comments:    Maeola Harman 05/16/2023, 11:38 PM

## 2023-05-16 NOTE — Plan of Care (Signed)
   Problem: Education: Goal: Knowledge of [facility] Education information/materials will improve Outcome: Progressing Goal: Emotional status will improve Outcome: Progressing Goal: Mental status will improve Outcome: Progressing

## 2023-05-17 DIAGNOSIS — F332 Major depressive disorder, recurrent severe without psychotic features: Secondary | ICD-10-CM | POA: Diagnosis not present

## 2023-05-17 NOTE — Progress Notes (Signed)
 Hospitalist service was requested to help facilitate setting up home health services.  Pt currently has no acute medical issues that needs managing.  Discussed with pt about his needs after going home.  Pt said he is able to perform ADL's, but needs help with cooking, cleaning, grocery shopping and transportation to doctor's appointments.  I made pt aware that cooking, cleaning, grocery shopping are not typically covered by [facility] services, and discussed options such as private-pay in-home help, or meal and grocery delivery services.  I will discuss with [facility] tomorrow about available assistance to help with transportation.

## 2023-05-17 NOTE — Progress Notes (Signed)
   05/17/23 0630  15 Minute Checks  Location Bedroom  Visual Appearance Calm  Behavior Sleeping  Sleep (Behavioral Health Patients Only)  Calculate sleep? (Click Yes once per 24 hr at 0600 safety check) Yes  Documented sleep last 24 hours 8.75

## 2023-05-17 NOTE — Progress Notes (Signed)
 Physical Therapy Treatment Patient Details Name: [name] MRN: [id number] DOB: [birth year] Today's Date: 05/17/2023   History of Present Illness [name] is a 58 year old Caucasian male with a history of MDD, anxiety, Crohn's disease, and chronic mesenteric ischemia who presented to ED on 04/13/23 at 0404 for suicide attempt by ingestion of 28 tablets of 10mg  Lexapro at 0200. Patient sent to behavior Med unit with MD assessment including MDD, generalized anxiety disorder, and tremor.    PT Comments  Pt was pleasant and motivated to participate during the session and put forth good effort throughout. Pt participated in the Tuttle Balance Test outcome measure per below scoring 51/56 compared to 46/56 on 3/12.  Pt with significant improvements in stability compared to prior session with 360 deg turn where he was able to complete the rotation in less than 4 sec with very good control as well as with SLS where he was able to easily stand for 10 sec with no LOB.  Pt reported no adverse symptoms during the session with SpO2 and HR WNL throughout. Pt will benefit from continued PT services upon discharge to safely address deficits listed in patient problem list for decreased caregiver assistance and eventual return to PLOF.       If plan is discharge home, recommend the following: Assist for transportation;Assistance with cooking/housework;Direct supervision/assist for medications management   Can travel by private vehicle     Yes  Equipment Recommendations  Rolling walker (2 wheels)    Recommendations for Other Services       Precautions / Restrictions Precautions Precautions: Fall Restrictions Weight Bearing Restrictions Per Provider Order: No     Mobility  Bed Mobility Overal bed mobility: Independent             General bed mobility comments: Good speed and control with bed mobility tasks    Transfers Overall transfer level: Modified independent Equipment  used: None   Sit to Stand: Modified independent (Device/Increase time)                Ambulation/Gait Ambulation/Gait assistance: Modified independent (Device/Increase time) Gait Distance (Feet): 200 Feet Assistive device: Rolling walker (2 wheels) Gait Pattern/deviations: Step-through pattern, WFL(Within Functional Limits)       General Gait Details: Mod Ind amb in the hallway with a RW with good control, stability, and cadence   Stairs             Wheelchair Mobility     Tilt Bed    Modified Rankin (Stroke Patients Only)       Balance     Sitting balance-Leahy Scale: Normal       Standing balance-Leahy Scale: Good                   Standardized Balance Assessment Standardized Balance Assessment : Berg Balance Test Berg Balance Test Sit to Stand: Able to stand without using hands and stabilize independently Standing Unsupported: Able to stand safely 2 minutes Sitting with Back Unsupported but Feet Supported on Floor or Stool: Able to sit safely and securely 2 minutes Stand to Sit: Sits safely with minimal use of hands Transfers: Able to transfer safely, minor use of hands Standing Unsupported with Eyes Closed: Able to stand 10 seconds safely Standing Ubsupported with Feet Together: Able to place feet together independently and stand 1 minute safely From Standing, Reach Forward with Outstretched Arm: Can reach confidently >25 cm (10") From Standing Position, Pick up Object from Floor: Able to pick up shoe  safely and easily From Standing Position, Turn to Look Behind Over each Shoulder: Turn sideways only but maintains balance Turn 360 Degrees: Able to turn 360 degrees safely in 4 seconds or less Standing Unsupported, Alternately Place Feet on Step/Stool: Able to complete 4 steps without aid with supervision Standing Unsupported, One Foot in Front: Able to place foot ahead of the other independently and hold 30 seconds Standing on One Leg: Able  to lift leg independently and hold > 10 seconds Total Score: 51        Communication Communication Communication: No apparent difficulties  Cognition Arousal: Alert Behavior During Therapy: WFL for tasks assessed/performed, Flat affect   PT - Cognitive impairments: No apparent impairments                         Following commands: Intact      Cueing Cueing Techniques: Verbal cues, Visual cues  Exercises      General Comments        Pertinent Vitals/Pain Pain Assessment Pain Assessment: No/denies pain    Home Living                          Prior Function            PT Goals (current goals can now be found in the care plan section) Progress towards PT goals: Progressing toward goals    Frequency    Min 1X/week      PT Plan      Co-evaluation              AM-PAC PT "6 Clicks" Mobility   Outcome Measure  Help needed turning from your back to your side while in a flat bed without using bedrails?: None Help needed moving from lying on your back to sitting on the side of a flat bed without using bedrails?: None Help needed moving to and from a bed to a chair (including a wheelchair)?: None Help needed standing up from a chair using your arms (e.g., wheelchair or bedside chair)?: None Help needed to walk in hospital room?: None Help needed climbing 3-5 steps with a railing? : None 6 Click Score: 24    End of Session Equipment Utilized During Treatment: Gait belt Activity Tolerance: Patient tolerated treatment well Patient left: in bed Nurse Communication: Mobility status PT Visit Diagnosis: Muscle weakness (generalized) (M62.81);Difficulty in walking, not elsewhere classified (R26.2)     Time: 7829-5621 PT Time Calculation (min) (ACUTE ONLY): 26 min  Charges:    $Therapeutic Activity: 23-37 mins PT General Charges $$ ACUTE PT VISIT: 1 Visit                    D. Elly Modena PT, DPT 05/17/23, 4:50 PM

## 2023-05-17 NOTE — Progress Notes (Signed)
 [facility] MD Progress Note  05/17/2023  [name]  MRN:  [id number]  [name] is a 58 year old Caucasian male with a history of MDD, anxiety, Crohn's disease, and chronic mesenteric ischemia who presented to [facility] emergency department on 04/13/23 at 0404 for suicide attempt by ingestion of 28 tablets of 10mg  Lexapro at 0200. Per ED documentation, patient had some mild symptoms of serotonin syndrome on arrival, including acute agitation and clonus, with symptomatic improvement with benzodiazepines. Did have a return of symptoms requiring additional dose of lorazepam. Poison control advised the total amount of ingested Lexapro likely is not enough to cause serotonin syndrome by itself and they recommended 8-hour cardiac monitoring. Qtc 498.  Subjective:  Chart reviewed, case discussed in multidisciplinary meeting, patient seen during rounds. No new acute events overnight.  He denies SI/HI/plan.  He denies auditory/visual hallucinations.   Appetite:  Fair  Past Psychiatric History: see h&P Family History:  Family History  Problem Relation Age of Onset   Heart disease Father    Thyroid cancer Father    Allergies Father    Clotting disorder Father    Breast cancer Mother    Stomach cancer Mother    Colon cancer Neg Hx    Esophageal cancer Neg Hx    Rectal cancer Neg Hx    Social History:  Social History   Substance and Sexual Activity  Alcohol Use No   Alcohol/week: 0.0 standard drinks of alcohol     Social History   Substance and Sexual Activity  Drug Use Not Currently   Types: Cocaine, Marijuana   Comment: QUIT USING DRUGS IN 1997    Social History   Socioeconomic History   Marital status: Single    Spouse name: Not on file   Number of children: 1   Years of education: Not on file   Highest education level: Not on file  Occupational History   Occupation: Disability   Tobacco Use   Smoking status: Former    Current packs/day: 0.00    Average packs/day:  1 pack/day for 15.0 years (15.0 ttl pk-yrs)    Types: Cigarettes    Start date: 03/28/1982    Quit date: 03/28/1997    Years since quitting: 26.1   Smokeless tobacco: Never  Vaping Use   Vaping status: Never Used  Substance and Sexual Activity   Alcohol use: No    Alcohol/week: 0.0 standard drinks of alcohol   Drug use: Not Currently    Types: Cocaine, Marijuana    Comment: QUIT USING DRUGS IN 1997   Sexual activity: Not Currently  Other Topics Concern   Not on file  Social History Narrative   Single, history of substance abuse in recovery   Previously occupied on medical disability   1 child  Daughter Wellsite geologist, lives and works in [place]   Elderly parents involved and he helps them   1 brother   Social Drivers of Corporate investment banker Strain: Low Risk  (08/28/2019)   Overall Financial Resource Strain (CARDIA)    Difficulty of Paying Living Expenses: Not hard at all  Food Insecurity: Food Insecurity Present (04/13/2023)   Hunger Vital Sign    Worried About Running Out of Food in the Last Year: Never true    Ran Out of Food in the Last Year: Sometimes true  Transportation Needs: No Transportation Needs (04/13/2023)   PRAPARE - Administrator, Civil Service (Medical): No    Lack of Transportation (Non-Medical):  No  Physical Activity: Insufficiently Active (11/08/2019)   Exercise Vital Sign    Days of Exercise per Week: 3 days    Minutes of Exercise per Session: 30 min  Stress: Stress Concern Present (08/28/2019)   [employer] of Occupational Health - Occupational Stress Questionnaire    Feeling of Stress : Very much  Social Connections: Socially Isolated (03/28/2023)   Social Connection and Isolation Panel [NHANES]    Frequency of Communication with Friends and Family: Three times a week    Frequency of Social Gatherings with Friends and Family: Twice a week    Attends Religious Services: Never    Database administrator or Organizations: No    Attends  Engineer, structural: Never    Marital Status: Divorced   Past Medical History:  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 06/15/2018   Cancer of skin of neck    Cataract    Chronic mesenteric ischemia ([name])    Chronic pain syndrome 07/09/2016   Crohn's disease of small and large intestines ([name])    followed by dr Baldo Ash gessner   Dairy product intolerance    Diarrhea, functional    Family history of adverse reaction to anesthesia    Grover's disease    transient acantholytic dermatosis   History of alcohol abuse    History of basal cell carcinoma excision    2013 left leg   History of Clostridium difficile    10/ 2014   History of multiple concussions    x6   last one Jan 2017 per pt--  no residual   History of substance abuse ([name])    quit 1997 per pt   History of suicide attempt    05-18-2012  overdose /  failure to thrive   Iron deficiency anemia due to chronic blood loss    Major depression, recurrent, chronic ([name])    Osteopenia    Personal history of adenomatous colonic polyps 12/2010, 03/2012   12/2010 - 8 mm serrated adenoma of rectum   Portal vein thrombosis 03/21/2015   right   Primary sclerosing cholangitis    ? 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   Substance abuse ([name]) 1997   Alcohol   Vitamin A deficiency 05/23/2018   Vitamin B6 deficiency 07/27/2018   Vitamin C deficiency 05/23/2018    Past Surgical History:  Procedure Laterality Date   ABDOMINAL AORTAGRAM N/A 03/29/2012   Procedure: ABDOMINAL Ronny Flurry;  Surgeon: Nada Libman, MD;  Location[facility] CATH LAB;  Service: Cardiovascular;  Laterality: N/A;   ADENOIDECTOMY  age 65   BIOPSY   05/31/2018   Procedure: BIOPSY;  Surgeon: Rachael Fee, MD;  Location: [facility] ENDOSCOPY;  Service: Endoscopy;;   CATARACT EXTRACTION W/ INTRAOCULAR LENS  IMPLANT, BILATERAL  2009   COLONOSCOPY  2001, 05/02/2003, 01/28/11   2012: Right colon Crohn's, rectal polyp   COLONOSCOPY  03/31/2012   Procedure: COLONOSCOPY;  Surgeon: Beverley Fiedler, MD;  Location: [facility] ENDOSCOPY;  Service: Gastroenterology;  Laterality: N/A;   COLONOSCOPY WITH PROPOFOL N/A 05/31/2018   Procedure: COLONOSCOPY WITH PROPOFOL;  Surgeon: Rachael Fee, MD;  Location: [facility] ENDOSCOPY;  Service: Endoscopy;  Laterality: N/A;   ESOPHAGOGASTRODUODENOSCOPY  01/28/2011   Normal   FOOT SURGERY Right age 26   MOHS SURGERY Left 11/2013   left ankle parakerotosis    PERCUTANEOUS LIVER BIOPSY  2007 and 2008   PILONIDAL CYST EXCISION  age 58   PLACEMENT OF SETON N/A 11/06/2015   Procedure: PLACEMENT OF SETON;  Surgeon: Romie Levee, MD;  Location: [facility] [place];  Service: General;  Laterality: N/A;   PLACEMENT OF SETON  07/2018   at [facility]   RECTAL EXAM UNDER ANESTHESIA N/A 01/18/2019   Procedure: ANAL EXAM UNDER ANESTHESIA,  INCISION AND DRAINAGE, SETON PLACEMENT;  Surgeon: Romie Levee, MD;  Location: [facility];  Service: General;  Laterality: N/A;   UPPER GASTROINTESTINAL ENDOSCOPY      Current Medications: Current Facility-Administered Medications  Medication Dose Route Frequency Provider Last Rate Last Admin   acetaminophen (TYLENOL) tablet 650 mg  650 mg Oral Q6H PRN Motley-Mangrum, Jadeka A, PMHNP       alum & mag hydroxide-simeth (MAALOX/MYLANTA) 200-200-20 MG/5ML suspension 30 mL  30 mL Oral Q4H PRN Motley-Mangrum, Jadeka A, PMHNP       ARIPiprazole (ABILIFY) tablet 5 mg  5 mg Oral QHS Verner Chol, MD   5 mg at 05/16/23 2106   doxycycline (VIBRA-TABS) tablet 100 mg  100 mg Oral Q12H Floydene Flock, MD   100 mg at 05/17/23 0846   feeding supplement (ENSURE ENLIVE / ENSURE PLUS) liquid 237  mL  1 Bottle Oral TID BM Verner Chol, MD   237 mL at 05/17/23 1400   hydrOXYzine (ATARAX) tablet 25 mg  25 mg Oral TID PRN Motley-Mangrum, Ezra Sites, PMHNP   25 mg at 05/16/23 2106   liver oil-zinc oxide (DESITIN) 40 % ointment   Topical Q12H PRN Verner Chol, MD   Given at 05/04/23 0803   LORazepam (ATIVAN) injection 1 mg  1 mg Intramuscular Q4H PRN Remington, Amber E, NP       LORazepam (ATIVAN) injection 2 mg  2 mg Intramuscular TID PRN Motley-Mangrum, Jadeka A, PMHNP       LORazepam (ATIVAN) tablet 1 mg  1 mg Oral Q4H PRN Remington, Amber E, NP   1 mg at 05/17/23 1317   magnesium hydroxide (MILK OF MAGNESIA) suspension 30 mL  30 mL Oral Daily PRN Motley-Mangrum, Jadeka A, PMHNP       multivitamin with minerals tablet 1 tablet  1 tablet Oral Daily Verner Chol, MD   1 tablet at 05/17/23 0847   neomycin-bacitracin-polymyxin [phone] OINT   Topical Q6H PRN Lewanda Rife, MD   1 Application at 05/15/23 0921   ondansetron (ZOFRAN) injection 4 mg  4 mg Intravenous Q6H PRN Verner Chol, MD   4 mg at 04/16/23 0905   ondansetron (ZOFRAN-ODT) disintegrating tablet 4 mg  4 mg Oral Q8H PRN Verner Chol, MD       predniSONE (DELTASONE) tablet 30 mg  30 mg Oral Q breakfast Motley-Mangrum, Jadeka A, PMHNP   30 mg at 05/17/23 0846   traZODone (DESYREL) tablet 50 mg  50 mg Oral QHS Verner Chol, MD   50 mg at 05/16/23 2106   venlafaxine XR (EFFEXOR-XR) 24 hr capsule 150 mg  150 mg Oral Q breakfast Lewanda Rife, MD   150 mg at 05/17/23 0846        Blood Alcohol level:  Lab Results  Component Value Date   ETH <10 04/13/2023   ETH <  10 03/28/2023    Metabolic Disorder Labs: Lab Results  Component Value Date   HGBA1C 4.8 04/03/2023   MPG 91.06 04/03/2023   No results found for: "PROLACTIN" Lab Results  Component Value Date   CHOL 209 (H) 04/03/2023   TRIG 75 04/03/2023   HDL 65 04/03/2023   CHOLHDL 3.2 04/03/2023   VLDL 15 04/03/2023   LDLCALC 129 (H) 04/03/2023     Psychiatric Specialty Exam:  Presentation  General Appearance:  Appropriate for Environment; Casual   Eye Contact: Fair   Speech: Clear and Coherent   Speech Volume: Normal       Mood and Affect  Mood: "Fine"   Affect: Stable, more animated     Thought Process  Thought Processes: Coherent   Descriptions of Associations:Intact   Orientation:Full (Time, Place and Person)   Thought Content:Logical   Hallucinations:Hallucinations: None   Ideas of Reference:None   Suicidal Thoughts:Suicidal Thoughts: No   Homicidal Thoughts:Homicidal Thoughts: No     Sensorium  Memory: Immediate Fair; Recent Fair; Remote Fair   Judgment: Fair   Insight: Fair     Art therapist  Concentration: Fair   Attention Span: Fair   Recall: [employer] of Knowledge: Fair   Language: Fair     Psychomotor Activity  Psychomotor Activity: Psychomotor Activity: Normal   Musculoskeletal: Strength & Muscle Tone: decreased Gait & Station: Improved, patient walks with the help of a walker Assets  Assets: Manufacturing systems engineer; Desire for Improvement; Resilience   Physical Exam: Physical Exam Vitals and nursing note reviewed.  HENT:     Head: Normocephalic.     Nose: No congestion.     Mouth/Throat:     Mouth: Mucous membranes are moist.  Eyes:     Pupils: Pupils are equal, round, and reactive to light.  Cardiovascular:     Rate and Rhythm: Normal rate.     Pulses: Normal pulses.  Pulmonary:     Breath sounds: Normal breath sounds.  Abdominal:     General: Bowel sounds are normal.  Neurological:     General: No focal deficit present.     Mental Status: He is alert.    Review of Systems  Constitutional:  Negative for chills and fever.  HENT: Negative.    Eyes: Negative.   Cardiovascular: Negative.   Gastrointestinal: Negative.   Skin: Negative.   Blood pressure 126/80, pulse 82, temperature 97.8 F (36.6 C), resp. rate 14, height 6\' 1"   (1.854 m), weight 60.6 kg, SpO2 98%. Body mass index is 17.61 kg/m.  Diagnosis: Principal Problem:   MDD (major depressive disorder), recurrent severe, without psychosis ([name]) Active Problems:   GAD (generalized anxiety disorder)   Tremor   PLAN: Safety and Monitoring:  -- Involuntary admission to inpatient psychiatric unit for safety, stabilization and treatment  -- Daily contact with patient to assess and evaluate symptoms and progress in treatment  -- Patient's case to be discussed in multi-disciplinary team meeting  -- Observation Level : q15 minute checks  -- Vital signs:  q12 hours  -- Precautions: suicide, elopement, and assault -- Encouraged patient to participate in unit milieu and in scheduled group therapies  2. Psychiatric Diagnoses and Treatment:  Effexor Xr 150 mg daily Continue Abilify 5 mg at bedtime for mood stabilization given the intensity of depression.  Discussed thoroughly the side effects risk benefits of Abilify and patient gave consent for the medication.  Added trazodone 50 mg nightly for sleep  3. Medical Issues Being  Addressed:  None today   4. Discharge Planning:   -- Social work and case management to assist with discharge planning and identification of hospital follow-up needs prior to discharge  -- Discharge date to be determined, Child psychotherapist and patient's family is working on home health care services  Hospitalist consult for home health care services  Lewanda Rife, MD

## 2023-05-17 NOTE — Plan of Care (Signed)
  Problem: Coping: Goal: Ability to verbalize frustrations and anger appropriately will improve Outcome: Progressing Goal: Ability to demonstrate self-control will improve Outcome: Progressing   Problem: Health Behavior/Discharge Planning: Goal: Identification of resources available to assist in meeting health care needs will improve Outcome: Progressing Goal: Compliance with treatment plan for underlying cause of condition will improve Outcome: Progressing

## 2023-05-17 NOTE — Group Note (Signed)
 Recreation Therapy Group Note   Group Topic:Other  Group Date: 05/17/2023 Start Time: 1500 End Time: 1605 Facilitators: Rosina Lowenstein, LRT, CTRS Location: Courtyard  Group Description: Music Reminisce. LRT encouraged patients to think of their favorite song(s) that reminded them of a positive memory or time in their life. LRT encouraged patient to talk about that memory aloud to the group. LRT played the song through a speaker for all to hear. LRT and patients discussed how thinking of a positive memory or time in their life can be used as a coping skill in everyday life post discharge.   Goal Area(s) Addressed: Patient will increase verbal communication by conversing with peers. Patient will contribute to group discussion with minimal prompting. Patient will reminisce a positive memory or moment in their life.    Affect/Mood: N/A   Participation Level: Did not attend    Clinical Observations/Individualized Feedback: Patient did not attend group.   Plan: Continue to engage patient in RT group sessions 2-3x/week.   Rosina Lowenstein, LRT, CTRS 05/17/2023 5:01 PM

## 2023-05-17 NOTE — Progress Notes (Signed)
   05/17/23 2200  Psych Admission Type (Psych Patients Only)  Admission Status Voluntary  Psychosocial Assessment  Patient Complaints Anxiety  Eye Contact Fair  Facial Expression Flat  Affect Flat  Speech Logical/coherent  Interaction Assertive  Motor Activity Slow  Appearance/Hygiene In scrubs  Behavior Characteristics Cooperative  Mood Pleasant  Thought Process  Coherency WDL  Content WDL  Delusions None reported or observed  Perception WDL  Hallucination None reported or observed  Judgment Impaired  Confusion None  Danger to Self  Current suicidal ideation? Denies  Self-Injurious Behavior No self-injurious ideation or behavior indicators observed or expressed   Agreement Not to Harm Self Yes  Description of Agreement verbal  Danger to Others  Danger to Others None reported or observed

## 2023-05-17 NOTE — Progress Notes (Signed)
   05/17/23 1100  Psych Admission Type (Psych Patients Only)  Admission Status Voluntary  Psychosocial Assessment  Patient Complaints Anxiety  Eye Contact Fair  Facial Expression Flat  Affect Flat  Speech Logical/coherent  Interaction Assertive  Motor Activity Slow  Appearance/Hygiene In scrubs  Behavior Characteristics Cooperative  Mood Pleasant  Thought Process  Coherency WDL  Content WDL  Delusions None reported or observed  Perception WDL  Hallucination None reported or observed  Judgment Impaired  Confusion None  Danger to Self  Current suicidal ideation? Denies  Agreement Not to Harm Self Yes  Danger to Others  Danger to Others None reported or observed

## 2023-05-17 NOTE — Group Note (Signed)
 Recreation Therapy Group Note   Group Topic:Coping Skills  Group Date: 05/17/2023 Start Time: 1100 End Time: 1140 Facilitators: Rosina Lowenstein, LRT, CTRS Location:  Dayroom  Group Description: Seated Exercise. LRT discussed the mental and physical benefits of exercise. LRT and group discussed how physical activity can be used as a coping skill. Pt's and LRT followed along to an exercise video on the TV screen that provided a visual representation and audio description of every exercise performed. Pt's encouraged to listen to their bodies and stop at any time if they experience feelings of discomfort or pain. Pts were encouraged to drink water and stay hydrated.   Goal Area(s) Addressed: Patient will learn benefits of physical activity. Patient will identify exercise as a coping skill.  Patient will follow multistep directions. Patient will try a new leisure interest.    Affect/Mood: Appropriate   Participation Level: Non-verbal    Clinical Observations/Individualized Feedback: [name] was present in the dayroom. Pt minimally engaged in prompted exercises. Pt did not engage with LRT or peers while in group.   Plan: Continue to engage patient in RT group sessions 2-3x/week.   Rosina Lowenstein, LRT, CTRS 05/17/2023 1:17 PM

## 2023-05-17 NOTE — Group Note (Signed)
 Date:  05/17/2023 Time:  9:39 PM  Group Topic/Focus:  Wrap-Up Group:   The focus of this group is to help patients review their daily goal of treatment and discuss progress on daily workbooks.    Participation Level:  Active  Participation Quality:  Appropriate  Affect:  Appropriate  Cognitive:  Appropriate  Insight: Good  Engagement in Group:  Engaged  Modes of Intervention:  Discussion  Additional Comments:    Maeola Harman 05/17/2023, 9:39 PM

## 2023-05-17 NOTE — Group Note (Signed)
 Date:  05/17/2023 Time:  10:58 AM  Group Topic/Focus:  Orientation:   The focus of this group is to educate the patient on the purpose and policies of crisis stabilization and provide a format to answer questions about their admission.  The group details unit policies and expectations of patients while admitted.    Participation Level:  Active  Participation Quality:  Attentive  Affect:  Appropriate  Cognitive:  Appropriate  Insight: Appropriate  Engagement in Group:  Engaged  Modes of Intervention:  Socialization  Additional Comments:     Alexis Frock 05/17/2023, 10:58 AM

## 2023-05-18 DIAGNOSIS — F332 Major depressive disorder, recurrent severe without psychotic features: Secondary | ICD-10-CM | POA: Diagnosis not present

## 2023-05-18 MED ORDER — MELATONIN 5 MG PO TABS
5.0000 mg | ORAL_TABLET | Freq: Every day | ORAL | Status: DC
  Administered 2023-05-18 – 2023-05-19 (×2): 5 mg via ORAL
  Filled 2023-05-18 (×2): qty 1

## 2023-05-18 MED ORDER — MELATONIN 3 MG PO TABS
3.0000 mg | ORAL_TABLET | Freq: Every day | ORAL | Status: DC
  Filled 2023-05-18: qty 1

## 2023-05-18 NOTE — Progress Notes (Signed)
   05/18/23 2100  Psych Admission Type (Psych Patients Only)  Admission Status Voluntary  Psychosocial Assessment  Patient Complaints Anxiety  Eye Contact Fair  Facial Expression Flat  Affect Flat  Speech Logical/coherent  Interaction Assertive  Motor Activity Slow  Appearance/Hygiene In scrubs  Behavior Characteristics Cooperative  Mood Pleasant  Thought Process  Coherency WDL  Content WDL  Delusions None reported or observed  Perception WDL  Hallucination None reported or observed  Judgment Impaired  Confusion None  Danger to Self  Current suicidal ideation? Denies   Pt is alert and oriented. Sad and depressed affect. Mood ambivalent. Endorses anxiety and depression 8/10. States feeling hopeless and anxious about going home.  Pt having mixed feeling about his discharge states he hopes everything goes well if not "I don't know what I'll do..."  Support and encouragement provided. po med compliant. No voiced thoughts of hurting himself. Q 15 min checks maintained for safety. No c/o pain/discomfort noted.

## 2023-05-18 NOTE — Progress Notes (Signed)
   05/18/23 1300  Psych Admission Type (Psych Patients Only)  Admission Status Voluntary  Psychosocial Assessment  Patient Complaints Anxiety  Eye Contact Fair  Facial Expression Flat  Affect Flat  Speech Logical/coherent  Interaction Assertive  Motor Activity Slow  Appearance/Hygiene In scrubs  Behavior Characteristics Cooperative  Mood Pleasant  Thought Process  Coherency WDL  Content WDL  Delusions None reported or observed  Perception WDL  Hallucination None reported or observed  Judgment Impaired  Confusion None  Danger to Self  Current suicidal ideation? Denies  Agreement Not to Harm Self Yes  Description of Agreement verbal  Danger to Others  Danger to Others None reported or observed

## 2023-05-18 NOTE — Plan of Care (Signed)
   Problem: Education: Goal: Emotional status will improve Outcome: Progressing Goal: Mental status will improve Outcome: Progressing

## 2023-05-18 NOTE — Group Note (Signed)
 Therapy Group Note  Group Topic:Other  Group Date: 05/18/2023 Start Time: 1300 End Time: 1330 Facilitators: Uvaldo Rybacki, Thomes Dinning, PT     Group Description: Group discussed impact of balance on safety and independence with functional tasks.  Identified and discussed any self-perceived balance deficits to personalize information.  Discussed and reviewed strategies to address/improve balance deficits: use of assist devices, activity pacing/energy conservation, environment/home safety modifications, focusing attention/minimizing distraction.  Reviewed and participated with standing LE therex designed to target dynamic balance reactions and LE strength/stability; provided handouts with HEP to be utilized outside of group time as appropriate.  Allowed time for questions and further discussion on any balance or mobility concerns/needs.   Therapeutic Goal(s): Identify and discuss any individual balance deficits and functional implications. Identify and discuss any environmental/home safety modifications that can optimize balance and safety for mobility within the home. Demonstrate understanding and performance of standing therex designed to target dynamic balance deficits.   Individual Participation:  Pt actively participated with the discussion and physical activity components of the session.  Pt was generally steady with standing therex/balance activities requiring only SBA for general safety.      Participation Level: Engaged   Participation Quality: Minimal Cues   Behavior: Alert, Appropriate, Attentive , and Cooperative   Speech/Thought Process: Focused and Organized   Affect/Mood: Appropriate   Insight: Good   Judgement: Good   Individualization: Per above   Modes of Intervention: Activity and Discussion  Patient Response to Interventions:  Attentive, Engaged, Interested , and Receptive   Plan: Continue to engage patient in OT groups 1 - 2x/week.  Ovidio Hanger PT,  DPT 05/18/23, 3:16 PM

## 2023-05-18 NOTE — Group Note (Signed)
 Date:  05/18/2023 Time:  10:47 AM  Group Topic/Focus:  Goals Group:   The focus of this group is to help patients establish daily goals to achieve during treatment and discuss how the patient can incorporate goal setting into their daily lives to aide in recovery.    Participation Level:  Active  Participation Quality:  Appropriate  Affect:  Appropriate  Cognitive:  Appropriate  Insight: Appropriate  Engagement in Group:  Engaged  Modes of Intervention:  Discussion   Ardelle Anton 05/18/2023, 10:47 AM

## 2023-05-18 NOTE — Group Note (Signed)
 Date:  05/18/2023 Time:  9:57 PM  Group Topic/Focus:  Goals Group:   The focus of this group is to help patients establish daily goals to achieve during treatment and discuss how the patient can incorporate goal setting into their daily lives to aide in recovery.    Participation Level:  Active  Participation Quality:  Appropriate and Attentive  Affect:  Appropriate  Cognitive:  Alert and Appropriate  Insight: Appropriate, Good, and Improving  Engagement in Group:  Developing/Improving and Engaged  Modes of Intervention:  Discussion, Problem-solving, Rapport Building, and Support  Additional Comments:     Wyvonne Carda 05/18/2023, 9:57 PM

## 2023-05-18 NOTE — Progress Notes (Signed)
 [facility] MD Progress Note  05/18/2023  MARICELA KAWAHARA  MRN:  [id number]  [name] is a 58 year old Caucasian male with a history of MDD, anxiety, Crohn's disease, and chronic mesenteric ischemia who presented to [facility] emergency department on 04/13/23 at 0404 for suicide attempt by ingestion of 28 tablets of 10mg  Lexapro at 0200. Per ED documentation, patient had some mild symptoms of serotonin syndrome on arrival, including acute agitation and clonus, with symptomatic improvement with benzodiazepines. Did have a return of symptoms requiring additional dose of lorazepam. Poison control advised the total amount of ingested Lexapro likely is not enough to cause serotonin syndrome by itself and they recommended 8-hour cardiac monitoring. Qtc 498.  Subjective:  Chart reviewed, case discussed in multidisciplinary meeting, patient seen during rounds.  Patient close seen by hospitalist Dr. [name].  Patient reported to Dr. Fran Lowes that he is able to take care of his ADLs, he needs help with cooking, cleaning grocery, shopping ,and transportation to doctors appointments.  This was discussed with Child psychotherapist.  Today on assessment patient endorsed "good" mood.  Patient reports that he feels he is able to take care of his daily activity.  Patient said that his mother gets groceries delivered to the house.  Patient also reports that his mother cooks for the family.  Patient shared that patient had worked at [employer] in the past, he  said" I like to cook".  Patient reports he would like to help her mother in the kitchen with cooking.  Patient feels hopeful going home to stay with his mother.  He is asking to discuss this with his brother first, per patient" my brother will understand it more,  my mother sometimes forgets things".  Patient was provided with support and reassurance no new acute events overnight.  He denies SI/HI/plan.  He denies auditory/visual hallucinations.   Appetite:  Fair  Past  Psychiatric History: see h&P Family History:  Family History  Problem Relation Age of Onset   Heart disease Father    Thyroid cancer Father    Allergies Father    Clotting disorder Father    Breast cancer Mother    Stomach cancer Mother    Colon cancer Neg Hx    Esophageal cancer Neg Hx    Rectal cancer Neg Hx    Social History:  Social History   Substance and Sexual Activity  Alcohol Use No   Alcohol/week: 0.0 standard drinks of alcohol     Social History   Substance and Sexual Activity  Drug Use Not Currently   Types: Cocaine, Marijuana   Comment: QUIT USING DRUGS IN 1997    Social History   Socioeconomic History   Marital status: Single    Spouse name: Not on file   Number of children: 1   Years of education: Not on file   Highest education level: Not on file  Occupational History   Occupation: Disability   Tobacco Use   Smoking status: Former    Current packs/day: 0.00    Average packs/day: 1 pack/day for 15.0 years (15.0 ttl pk-yrs)    Types: Cigarettes    Start date: 03/28/1982    Quit date: 03/28/1997    Years since quitting: 26.1   Smokeless tobacco: Never  Vaping Use   Vaping status: Never Used  Substance and Sexual Activity   Alcohol use: No    Alcohol/week: 0.0 standard drinks of alcohol   Drug use: Not Currently    Types: Cocaine, Marijuana  Comment: QUIT USING DRUGS IN 1997   Sexual activity: Not Currently  Other Topics Concern   Not on file  Social History Narrative   Single, history of substance abuse in recovery   Previously occupied on medical disability   1 child  Daughter Wellsite geologist, lives and works in [place]   Elderly parents involved and he helps them   1 brother   Social Drivers of Corporate investment banker Strain: Low Risk  (08/28/2019)   Overall Financial Resource Strain (CARDIA)    Difficulty of Paying Living Expenses: Not hard at all  Food Insecurity: Food Insecurity Present (04/13/2023)   Hunger Vital Sign    Worried  About Running Out of Food in the Last Year: Never true    Ran Out of Food in the Last Year: Sometimes true  Transportation Needs: No Transportation Needs (04/13/2023)   PRAPARE - Administrator, Civil Service (Medical): No    Lack of Transportation (Non-Medical): No  Physical Activity: Insufficiently Active (11/08/2019)   Exercise Vital Sign    Days of Exercise per Week: 3 days    Minutes of Exercise per Session: 30 min  Stress: Stress Concern Present (08/28/2019)   [employer] of Occupational Health - Occupational Stress Questionnaire    Feeling of Stress : Very much  Social Connections: Socially Isolated (03/28/2023)   Social Connection and Isolation Panel [NHANES]    Frequency of Communication with Friends and Family: Three times a week    Frequency of Social Gatherings with Friends and Family: Twice a week    Attends Religious Services: Never    Database administrator or Organizations: No    Attends Engineer, structural: Never    Marital Status: Divorced   Past Medical History:  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 06/15/2018   Cancer of skin of neck    Cataract    Chronic mesenteric ischemia ([name])    Chronic pain syndrome 07/09/2016   Crohn's disease of small and large intestines ([name])    followed by dr Baldo Ash gessner   Dairy product intolerance    Diarrhea, functional    Family history of adverse reaction to anesthesia    Grover's disease    transient acantholytic dermatosis   History of alcohol abuse    History of basal cell carcinoma excision    2013 left leg   History of Clostridium difficile    10/ 2014   History of multiple concussions    x6   last one Jan 2017 per pt--  no residual   History of substance  abuse ([name])    quit 1997 per pt   History of suicide attempt    05-18-2012  overdose /  failure to thrive   Iron deficiency anemia due to chronic blood loss    Major depression, recurrent, chronic ([name])    Osteopenia    Personal history of adenomatous colonic polyps 12/2010, 03/2012   12/2010 - 8 mm serrated adenoma of rectum   Portal vein thrombosis 03/21/2015   right   Primary sclerosing cholangitis    ? hepatitis overlap - liver bx x 2 and MRCP   Seasonal allergies  Steroid-induced diabetes ([name]) 03/15/2022   Substance abuse ([name]) 1997   Alcohol   Vitamin A deficiency 05/23/2018   Vitamin B6 deficiency 07/27/2018   Vitamin C deficiency 05/23/2018    Past Surgical History:  Procedure Laterality Date   ABDOMINAL AORTAGRAM N/A 03/29/2012   Procedure: ABDOMINAL Ronny Flurry;  Surgeon: Nada Libman, MD;  Location[facility] CATH LAB;  Service: Cardiovascular;  Laterality: N/A;   ADENOIDECTOMY  age 58   BIOPSY  05/31/2018   Procedure: BIOPSY;  Surgeon: Rachael Fee, MD;  Location: [facility] ENDOSCOPY;  Service: Endoscopy;;   CATARACT EXTRACTION W/ INTRAOCULAR LENS  IMPLANT, BILATERAL  2009   COLONOSCOPY  2001, 05/02/2003, 01/28/11   2012: Right colon Crohn's, rectal polyp   COLONOSCOPY  03/31/2012   Procedure: COLONOSCOPY;  Surgeon: Beverley Fiedler, MD;  Location[facility] ENDOSCOPY;  Service: Gastroenterology;  Laterality: N/A;   COLONOSCOPY WITH PROPOFOL N/A 05/31/2018   Procedure: COLONOSCOPY WITH PROPOFOL;  Surgeon: Rachael Fee, MD;  Location: [facility] ENDOSCOPY;  Service: Endoscopy;  Laterality: N/A;   ESOPHAGOGASTRODUODENOSCOPY  01/28/2011   Normal   FOOT SURGERY Right age 57   MOHS SURGERY Left 11/2013   left ankle parakerotosis    PERCUTANEOUS LIVER BIOPSY  2007 and 2008   PILONIDAL CYST EXCISION  age 51   PLACEMENT OF SETON N/A 11/06/2015   Procedure: PLACEMENT OF SETON;  Surgeon: Romie Levee, MD;  Location: [facility];  Service: General;  Laterality: N/A;   PLACEMENT OF  SETON  07/2018   at [facility]   RECTAL EXAM UNDER ANESTHESIA N/A 01/18/2019   Procedure: ANAL EXAM UNDER ANESTHESIA,  INCISION AND DRAINAGE, SETON PLACEMENT;  Surgeon: Romie Levee, MD;  Location: [facility];  Service: General;  Laterality: N/A;   UPPER GASTROINTESTINAL ENDOSCOPY      Current Medications: Current Facility-Administered Medications  Medication Dose Route Frequency Provider Last Rate Last Admin   acetaminophen (TYLENOL) tablet 650 mg  650 mg Oral Q6H PRN Motley-Mangrum, Jadeka A, PMHNP       alum & mag hydroxide-simeth (MAALOX/MYLANTA) 200-200-20 MG/5ML suspension 30 mL  30 mL Oral Q4H PRN Motley-Mangrum, Jadeka A, PMHNP       ARIPiprazole (ABILIFY) tablet 5 mg  5 mg Oral QHS Verner Chol, MD   5 mg at 05/17/23 2107   doxycycline (VIBRA-TABS) tablet 100 mg  100 mg Oral Q12H Floydene Flock, MD   100 mg at 05/18/23 0854   feeding supplement (ENSURE ENLIVE / ENSURE PLUS) liquid 237 mL  1 Bottle Oral TID BM Verner Chol, MD   237 mL at 05/18/23 1017   hydrOXYzine (ATARAX) tablet 25 mg  25 mg Oral TID PRN Motley-Mangrum, Ezra Sites, PMHNP   25 mg at 05/17/23 2107   liver oil-zinc oxide (DESITIN) 40 % ointment   Topical Q12H PRN Verner Chol, MD   Given at 05/04/23 0803   LORazepam (ATIVAN) injection 1 mg  1 mg Intramuscular Q4H PRN Remington, Amber E, NP       LORazepam (ATIVAN) injection 2 mg  2 mg Intramuscular TID PRN Motley-Mangrum, Jadeka A, PMHNP       LORazepam (ATIVAN) tablet 1 mg  1 mg Oral Q4H PRN Remington, Amber E, NP   1 mg at 05/18/23 0127   magnesium hydroxide (MILK OF MAGNESIA) suspension 30 mL  30 mL Oral Daily PRN Motley-Mangrum, Jadeka A, PMHNP       multivitamin with minerals tablet 1 tablet  1 tablet Oral Daily Barataria, [place],  MD   1 tablet at 05/18/23 0854   neomycin-bacitracin-polymyxin [phone] OINT   Topical Q6H PRN Lewanda Rife, MD   1 Application at 05/18/23 0953   ondansetron (ZOFRAN) injection 4 mg  4 mg Intravenous Q6H  PRN Verner Chol, MD   4 mg at 04/16/23 0905   ondansetron (ZOFRAN-ODT) disintegrating tablet 4 mg  4 mg Oral Q8H PRN Verner Chol, MD       predniSONE (DELTASONE) tablet 30 mg  30 mg Oral Q breakfast Motley-Mangrum, Jadeka A, PMHNP   30 mg at 05/18/23 0854   traZODone (DESYREL) tablet 50 mg  50 mg Oral QHS Verner Chol, MD   50 mg at 05/17/23 2107   venlafaxine XR (EFFEXOR-XR) 24 hr capsule 150 mg  150 mg Oral Q breakfast Lewanda Rife, MD   150 mg at 05/18/23 0854        Blood Alcohol level:  Lab Results  Component Value Date   ETH <10 04/13/2023   ETH <10 03/28/2023    Metabolic Disorder Labs: Lab Results  Component Value Date   HGBA1C 4.8 04/03/2023   MPG 91.06 04/03/2023   No results found for: "PROLACTIN" Lab Results  Component Value Date   CHOL 209 (H) 04/03/2023   TRIG 75 04/03/2023   HDL 65 04/03/2023   CHOLHDL 3.2 04/03/2023   VLDL 15 04/03/2023   LDLCALC 129 (H) 04/03/2023    Psychiatric Specialty Exam:  Presentation  General Appearance:  Appropriate for Environment; Casual   Eye Contact: Fair   Speech: Clear and Coherent   Speech Volume: Normal       Mood and Affect  Mood: "good"   Affect: Stable, more animated     Thought Process  Thought Processes: Coherent   Descriptions of Associations:Intact   Orientation:Full (Time, Place and Person)   Thought Content:Logical   Hallucinations:Hallucinations: None   Ideas of Reference:None   Suicidal Thoughts:Suicidal Thoughts: No   Homicidal Thoughts:Homicidal Thoughts: No     Sensorium  Memory: Immediate Fair; Recent Fair; Remote Fair   Judgment: Fair   Insight: Fair     Art therapist  Concentration: Fair   Attention Span: Fair   Recall: [employer] of Knowledge: Fair   Language: Fair     Psychomotor Activity  Psychomotor Activity: Psychomotor Activity: Normal   Musculoskeletal: Strength & Muscle Tone: Improved Gait & Station:  Improved, patient walks with the help of a walker Assets  Assets: Manufacturing systems engineer; Desire for Improvement; Resilience   Physical Exam: Physical Exam Vitals and nursing note reviewed.  HENT:     Head: Normocephalic.     Nose: No congestion.     Mouth/Throat:     Mouth: Mucous membranes are moist.  Eyes:     Pupils: Pupils are equal, round, and reactive to light.  Cardiovascular:     Rate and Rhythm: Normal rate.     Pulses: Normal pulses.  Pulmonary:     Breath sounds: Normal breath sounds.  Abdominal:     General: Bowel sounds are normal.  Neurological:     General: No focal deficit present.     Mental Status: He is alert.    Review of Systems  Constitutional:  Negative for chills and fever.  HENT: Negative.    Eyes: Negative.   Cardiovascular: Negative.   Gastrointestinal: Negative.   Skin: Negative.   Blood pressure 111/77, pulse 76, temperature 98.2 F (36.8 C), resp. rate 16, height 6\' 1"  (1.854 m), weight 60.6 kg, SpO2  97%. Body mass index is 17.61 kg/m.  Diagnosis: Principal Problem:   MDD (major depressive disorder), recurrent severe, without psychosis ([name]) Active Problems:   GAD (generalized anxiety disorder)   Tremor   PLAN: Safety and Monitoring:  -- Involuntary admission to inpatient psychiatric unit for safety, stabilization and treatment  -- Daily contact with patient to assess and evaluate symptoms and progress in treatment  -- Patient's case to be discussed in multi-disciplinary team meeting  -- Observation Level : q15 minute checks  -- Vital signs:  q12 hours  -- Precautions: suicide, elopement, and assault -- Encouraged patient to participate in unit milieu and in scheduled group therapies  2. Psychiatric Diagnoses and Treatment:  Effexor Xr 150 mg daily Continue Abilify 5 mg at bedtime for mood stabilization given the intensity of depression.  Discussed thoroughly the side effects risk benefits of Abilify and patient gave consent for the  medication.  Added trazodone 50 mg nightly for sleep  3. Medical Issues Being Addressed:  None today   4. Discharge Planning:   -- Social work and case management to assist with discharge planning and identification of hospital follow-up needs prior to discharge  -- Discharge date to be determined, Child psychotherapist and patient's family is working on home health care services  Dr Tama Gander input is appreciated.  Lewanda Rife, MD

## 2023-05-18 NOTE — Progress Notes (Signed)
   05/18/23 0616  15 Minute Checks  Location Bedroom  Visual Appearance Calm  Behavior Sleeping  Sleep (Behavioral Health Patients Only)  Calculate sleep? (Click Yes once per 24 hr at 0600 safety check) Yes  Documented sleep last 24 hours 10

## 2023-05-18 NOTE — Consult Note (Addendum)
  Hospitalist Consult NOTE    [name]  NWG:[id number] DOB: June 18, 1965 DOA: 04/13/2023 PCP: Daisy Floro, MD  L32A/L32A-AA  LOS: 35 days   Brief hospital course:   Assessment & Plan: [name] is a 58 year old Caucasian male with a history of MDD, anxiety, Crohn's disease, and chronic mesenteric ischemia who presented to for suicide attempt by ingestion of 28 tablets of 10mg  Lexapro.    Pt was admitted into inpatient psych unit and now ready for discharge home.  Hospitalist service was requested to help facilitate setting up home health services.    SI MDD, recurrent --management per psych   # Crohn's disease of small and large intestine with history of fistula and perianal abscess  # Chronic steroid dependence  --Status post colorectal surgery care with seton placement in the past for perirectal fistula and abscess.  Pt is on chronic prednisone and could not tolerate wean. --cont home prednisone 30 mg daily --outpatient GI f/u with Dr. Leone Payor   History of mesenteric ischemia  --currently stable  Autoimmune hepatitis-PSC overlap with cirrhosis  --currently stable --outpatient GI f/u with Dr. Leone Payor   Weakness --pt currently walks with a walker, is unsteady and has been benefiting from higher level balance training with PT --will benefit from [facility] PT  Pt currently has no acute medicine issues that needs managing.  I have discussed with psych attending and TOC about discharge planning and HH.  Hospitalist service will sign off.   Subjective and Interval History:  Pt reported doing better in terms of his mood.  Discussed with pt about his needs after going home. Pt said he is able to perform ADL's, but needs help with cooking, cleaning, grocery shopping and transportation to doctor's appointments. I made pt aware that cooking, cleaning, grocery shopping are not typically covered by [facility] services, and discussed options such as private-pay in-home help, or  meal and grocery delivery services.  TOC requested to look into assistance for transportation to doctors' appointments.   Objective: Vitals:   05/16/23 1943 05/17/23 0733 05/17/23 2003 05/18/23 0721  BP: 98/67 103/77 126/80 111/77  Pulse: 85 74 82 76  Resp: 17 18 14 16   Temp: 98.3 F (36.8 C) 97.9 F (36.6 C) 97.8 F (36.6 C) 98.2 F (36.8 C)  TempSrc:      SpO2: 98% 98% 98% 97%  Weight:      Height:       No intake or output data in the 24 hours ending 05/18/23 1824 Filed Weights   04/13/23 1945  Weight: 60.6 kg    Examination:   Constitutional: NAD, AAOx3 HEENT: conjunctivae and lids normal, EOMI CV: No cyanosis.   RESP: normal respiratory effort, on RA Neuro: II - XII grossly intact.   Psych: Normal mood and affect.  Appropriate judgement and reason   Data Reviewed: I have personally reviewed labs and imaging studies  Time spent: 60 minutes  [name], MD [facility] If 7PM-7AM, please contact night-coverage 05/18/2023, 6:24 PM

## 2023-05-19 DIAGNOSIS — F332 Major depressive disorder, recurrent severe without psychotic features: Secondary | ICD-10-CM | POA: Diagnosis not present

## 2023-05-19 MED ORDER — TRIPLE ANTIBIOTIC 3.5-400-5000 EX OINT: 1.0000 | TOPICAL_OINTMENT | Freq: Four times a day (QID) | CUTANEOUS | 0 refills | Status: DC | PRN

## 2023-05-19 MED ORDER — DOXYCYCLINE HYCLATE 100 MG PO TABS: 100.0000 mg | ORAL_TABLET | Freq: Two times a day (BID) | ORAL | 0 refills | Status: DC

## 2023-05-19 MED ORDER — PREDNISONE 20 MG PO TABS: 30.0000 mg | ORAL_TABLET | Freq: Every day | ORAL | 0 refills | Status: DC

## 2023-05-19 MED ORDER — VENLAFAXINE HCL ER 150 MG PO CP24: 150.0000 mg | ORAL_CAPSULE | Freq: Every day | ORAL | 0 refills | Status: AC

## 2023-05-19 MED ORDER — ADULT MULTIVITAMIN W/MINERALS CH: 1.0000 | ORAL_TABLET | Freq: Every day | ORAL | 0 refills | Status: DC

## 2023-05-19 MED ORDER — ARIPIPRAZOLE 5 MG PO TABS: 5.0000 mg | ORAL_TABLET | Freq: Every day | ORAL | 0 refills | Status: DC

## 2023-05-19 MED ORDER — HYDROXYZINE HCL 25 MG PO TABS: 25.0000 mg | ORAL_TABLET | Freq: Three times a day (TID) | ORAL | 0 refills | Status: AC | PRN

## 2023-05-19 MED ORDER — LORAZEPAM 1 MG PO TABS: 1.0000 mg | ORAL_TABLET | ORAL | 0 refills | Status: DC | PRN

## 2023-05-19 MED ORDER — MELATONIN 5 MG PO TABS: 5.0000 mg | ORAL_TABLET | Freq: Every day | ORAL | 0 refills | Status: DC

## 2023-05-19 NOTE — Progress Notes (Signed)
 [facility] MD Progress Note  05/19/2023  [name]  MRN:  [id number]  [name] is a 58 year old Caucasian male with a history of MDD, anxiety, Crohn's disease, and chronic mesenteric ischemia who presented to [facility] emergency department on 04/13/23 at 0404 for suicide attempt by ingestion of 28 tablets of 10mg  Lexapro at 0200. Per ED documentation, patient had some mild symptoms of serotonin syndrome on arrival, including acute agitation and clonus, with symptomatic improvement with benzodiazepines. Did have a return of symptoms requiring additional dose of lorazepam. Poison control advised the total amount of ingested Lexapro likely is not enough to cause serotonin syndrome by itself and they recommended 8-hour cardiac monitoring. Qtc 498.  Subjective:  Chart reviewed, case discussed in multidisciplinary meeting, patient seen during rounds.  Patient close seen by hospitalist Dr. [name].  Patient reported to Dr. Fran Lowes that he is able to take care of his ADLs, he needs help with cooking, cleaning grocery, shopping ,and transportation to doctors appointments.  This was discussed with Child psychotherapist.  Today on assessment patient endorsed "good" mood.  He reports he is excited to go home tomorrow.  Patient continues to report that he feels he is able to take care of his daily activity.  Patient feels hopeful going home to stay with his mother.  Patient was provided with support and reassurance no new acute events overnight.  He denies SI/HI/plan.  He denies auditory/visual hallucinations.   Appetite:  Fair  Past Psychiatric History: see h&P Family History:  Family History  Problem Relation Age of Onset   Heart disease Father    Thyroid cancer Father    Allergies Father    Clotting disorder Father    Breast cancer Mother    Stomach cancer Mother    Colon cancer Neg Hx    Esophageal cancer Neg Hx    Rectal cancer Neg Hx    Social History:  Social History   Substance and Sexual  Activity  Alcohol Use No   Alcohol/week: 0.0 standard drinks of alcohol     Social History   Substance and Sexual Activity  Drug Use Not Currently   Types: Cocaine, Marijuana   Comment: QUIT USING DRUGS IN 1997    Social History   Socioeconomic History   Marital status: Single    Spouse name: Not on file   Number of children: 1   Years of education: Not on file   Highest education level: Not on file  Occupational History   Occupation: Disability   Tobacco Use   Smoking status: Former    Current packs/day: 0.00    Average packs/day: 1 pack/day for 15.0 years (15.0 ttl pk-yrs)    Types: Cigarettes    Start date: 03/28/1982    Quit date: 03/28/1997    Years since quitting: 26.1   Smokeless tobacco: Never  Vaping Use   Vaping status: Never Used  Substance and Sexual Activity   Alcohol use: No    Alcohol/week: 0.0 standard drinks of alcohol   Drug use: Not Currently    Types: Cocaine, Marijuana    Comment: QUIT USING DRUGS IN 1997   Sexual activity: Not Currently  Other Topics Concern   Not on file  Social History Narrative   Single, history of substance abuse in recovery   Previously occupied on medical disability   1 child  Daughter Wellsite geologist, lives and works in [place]   Elderly parents involved and he helps them   1 brother  Social Drivers of Corporate investment banker Strain: Low Risk  (08/28/2019)   Overall Financial Resource Strain (CARDIA)    Difficulty of Paying Living Expenses: Not hard at all  Food Insecurity: Food Insecurity Present (04/13/2023)   Hunger Vital Sign    Worried About Running Out of Food in the Last Year: Never true    Ran Out of Food in the Last Year: Sometimes true  Transportation Needs: No Transportation Needs (04/13/2023)   PRAPARE - Administrator, Civil Service (Medical): No    Lack of Transportation (Non-Medical): No  Physical Activity: Insufficiently Active (11/08/2019)   Exercise Vital Sign    Days of Exercise per  Week: 3 days    Minutes of Exercise per Session: 30 min  Stress: Stress Concern Present (08/28/2019)   [employer] of Occupational Health - Occupational Stress Questionnaire    Feeling of Stress : Very much  Social Connections: Socially Isolated (03/28/2023)   Social Connection and Isolation Panel [NHANES]    Frequency of Communication with Friends and Family: Three times a week    Frequency of Social Gatherings with Friends and Family: Twice a week    Attends Religious Services: Never    Database administrator or Organizations: No    Attends Engineer, structural: Never    Marital Status: Divorced   Past Medical History:  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 06/15/2018   Cancer of skin of neck    Cataract    Chronic mesenteric ischemia ([name])    Chronic pain syndrome 07/09/2016   Crohn's disease of small and large intestines ([name])    followed by dr Baldo Ash gessner   Dairy product intolerance    Diarrhea, functional    Family history of adverse reaction to anesthesia    Grover's disease    transient acantholytic dermatosis   History of alcohol abuse    History of basal cell carcinoma excision    2013 left leg   History of Clostridium difficile    10/ 2014   History of multiple concussions    x6   last one Jan 2017 per pt--  no residual   History of substance abuse ([name])    quit 1997 per pt   History of suicide attempt    05-18-2012  overdose /  failure to thrive   Iron deficiency anemia due to chronic blood loss    Major depression, recurrent, chronic ([name])    Osteopenia    Personal history of adenomatous colonic polyps 12/2010, 03/2012   12/2010 - 8 mm serrated adenoma of rectum   Portal vein thrombosis 03/21/2015   right    Primary sclerosing cholangitis    ? 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   Substance abuse ([name]) 1997   Alcohol   Vitamin A deficiency 05/23/2018   Vitamin B6 deficiency 07/27/2018   Vitamin C deficiency 05/23/2018    Past Surgical History:  Procedure Laterality Date   ABDOMINAL AORTAGRAM N/A 03/29/2012   Procedure: ABDOMINAL Ronny Flurry;  Surgeon: Nada Libman, MD;  Location: [facility] CATH LAB;  Service: Cardiovascular;  Laterality: N/A;  ADENOIDECTOMY  age 12   BIOPSY  05/31/2018   Procedure: BIOPSY;  Surgeon: Rachael Fee, MD;  Location: [facility] ENDOSCOPY;  Service: Endoscopy;;   CATARACT EXTRACTION W/ INTRAOCULAR LENS  IMPLANT, BILATERAL  2009   COLONOSCOPY  2001, 05/02/2003, 01/28/11   2012: Right colon Crohn's, rectal polyp   COLONOSCOPY  03/31/2012   Procedure: COLONOSCOPY;  Surgeon: Beverley Fiedler, MD;  Location: [facility] ENDOSCOPY;  Service: Gastroenterology;  Laterality: N/A;   COLONOSCOPY WITH PROPOFOL N/A 05/31/2018   Procedure: COLONOSCOPY WITH PROPOFOL;  Surgeon: Rachael Fee, MD;  Location: [facility] ENDOSCOPY;  Service: Endoscopy;  Laterality: N/A;   ESOPHAGOGASTRODUODENOSCOPY  01/28/2011   Normal   FOOT SURGERY Right age 77   MOHS SURGERY Left 11/2013   left ankle parakerotosis    PERCUTANEOUS LIVER BIOPSY  2007 and 2008   PILONIDAL CYST EXCISION  age 20   PLACEMENT OF SETON N/A 11/06/2015   Procedure: PLACEMENT OF SETON;  Surgeon: Romie Levee, MD;  Location: [facility] [place];  Service: General;  Laterality: N/A;   PLACEMENT OF SETON  07/2018   at [facility]   RECTAL EXAM UNDER ANESTHESIA N/A 01/18/2019   Procedure: ANAL EXAM UNDER ANESTHESIA,  INCISION AND DRAINAGE, SETON PLACEMENT;  Surgeon: Romie Levee, MD;  Location: [facility];  Service: General;  Laterality: N/A;   UPPER GASTROINTESTINAL ENDOSCOPY      Current Medications: Current Facility-Administered Medications   Medication Dose Route Frequency Provider Last Rate Last Admin   acetaminophen (TYLENOL) tablet 650 mg  650 mg Oral Q6H PRN Motley-Mangrum, Jadeka A, PMHNP       alum & mag hydroxide-simeth (MAALOX/MYLANTA) 200-200-20 MG/5ML suspension 30 mL  30 mL Oral Q4H PRN Motley-Mangrum, Jadeka A, PMHNP       ARIPiprazole (ABILIFY) tablet 5 mg  5 mg Oral QHS Verner Chol, MD   5 mg at 05/18/23 2125   doxycycline (VIBRA-TABS) tablet 100 mg  100 mg Oral Q12H Floydene Flock, MD   100 mg at 05/19/23 0816   feeding supplement (ENSURE ENLIVE / ENSURE PLUS) liquid 237 mL  1 Bottle Oral TID BM Verner Chol, MD   237 mL at 05/19/23 1357   hydrOXYzine (ATARAX) tablet 25 mg  25 mg Oral TID PRN Motley-Mangrum, Jadeka A, PMHNP   25 mg at 05/19/23 1050   liver oil-zinc oxide (DESITIN) 40 % ointment   Topical Q12H PRN Verner Chol, MD   Given at 05/04/23 0803   LORazepam (ATIVAN) injection 1 mg  1 mg Intramuscular Q4H PRN Remington, Amber E, NP       LORazepam (ATIVAN) injection 2 mg  2 mg Intramuscular TID PRN Motley-Mangrum, Jadeka A, PMHNP       LORazepam (ATIVAN) tablet 1 mg  1 mg Oral Q4H PRN Remington, Amber E, NP   1 mg at 05/19/23 0207   magnesium hydroxide (MILK OF MAGNESIA) suspension 30 mL  30 mL Oral Daily PRN Motley-Mangrum, Jadeka A, PMHNP       melatonin tablet 5 mg  5 mg Oral QHS Verner Chol, MD   5 mg at 05/18/23 2211   multivitamin with minerals tablet 1 tablet  1 tablet Oral Daily Verner Chol, MD   1 tablet at 05/19/23 0815   neomycin-bacitracin-polymyxin [phone] OINT   Topical Q6H PRN Lewanda Rife, MD   1 Application at 05/19/23 0816   ondansetron (ZOFRAN) injection 4 mg  4 mg Intravenous Q6H PRN Verner Chol, MD   4 mg at 04/16/23  0905   ondansetron (ZOFRAN-ODT) disintegrating tablet 4 mg  4 mg Oral Q8H PRN Verner Chol, MD       predniSONE (DELTASONE) tablet 30 mg  30 mg Oral Q breakfast Motley-Mangrum, Jadeka A, PMHNP   30 mg at 05/19/23 0816   venlafaxine XR  (EFFEXOR-XR) 24 hr capsule 150 mg  150 mg Oral Q breakfast Lewanda Rife, MD   150 mg at 05/19/23 0816        Blood Alcohol level:  Lab Results  Component Value Date   ETH <10 04/13/2023   ETH <10 03/28/2023    Metabolic Disorder Labs: Lab Results  Component Value Date   HGBA1C 4.8 04/03/2023   MPG 91.06 04/03/2023   No results found for: "PROLACTIN" Lab Results  Component Value Date   CHOL 209 (H) 04/03/2023   TRIG 75 04/03/2023   HDL 65 04/03/2023   CHOLHDL 3.2 04/03/2023   VLDL 15 04/03/2023   LDLCALC 129 (H) 04/03/2023    Psychiatric Specialty Exam:  Presentation  General Appearance:  Appropriate for Environment; Casual   Eye Contact: Fair   Speech: Clear and Coherent   Speech Volume: Normal       Mood and Affect  Mood: "good"   Affect: Stable, more animated     Thought Process  Thought Processes: Coherent   Descriptions of Associations:Intact   Orientation:Full (Time, Place and Person)   Thought Content:Logical   Hallucinations:Hallucinations: None   Ideas of Reference:None   Suicidal Thoughts:Suicidal Thoughts: No   Homicidal Thoughts:Homicidal Thoughts: No     Sensorium  Memory: Immediate Fair; Recent Fair; Remote Fair   Judgment: Fair   Insight: Fair     Art therapist  Concentration: Fair   Attention Span: Fair   Recall: [employer] of Knowledge: Fair   Language: Fair     Psychomotor Activity  Psychomotor Activity: Psychomotor Activity: Normal   Musculoskeletal: Strength & Muscle Tone: Improved Gait & Station: Improved, patient walks with the help of a walker Assets  Assets: Manufacturing systems engineer; Desire for Improvement; Resilience   Physical Exam: Physical Exam Vitals and nursing note reviewed.  HENT:     Head: Normocephalic.     Nose: No congestion.     Mouth/Throat:     Mouth: Mucous membranes are moist.  Eyes:     Pupils: Pupils are equal, round, and reactive to light.   Cardiovascular:     Rate and Rhythm: Normal rate.     Pulses: Normal pulses.  Pulmonary:     Breath sounds: Normal breath sounds.  Abdominal:     General: Bowel sounds are normal.  Neurological:     General: No focal deficit present.     Mental Status: He is alert.    Review of Systems  Constitutional:  Negative for chills and fever.  HENT: Negative.    Eyes: Negative.   Cardiovascular: Negative.   Gastrointestinal: Negative.   Skin: Negative.   Blood pressure 118/80, pulse 74, temperature 98 F (36.7 C), resp. rate 17, height 6\' 1"  (1.854 m), weight 60.6 kg, SpO2 99%. Body mass index is 17.61 kg/m.  Diagnosis: Principal Problem:   MDD (major depressive disorder), recurrent severe, without psychosis ([name]) Active Problems:   GAD (generalized anxiety disorder)   Tremor   PLAN: Safety and Monitoring:  -- Involuntary admission to inpatient psychiatric unit for safety, stabilization and treatment  -- Daily contact with patient to assess and evaluate symptoms and progress in treatment  -- Patient's case to  be discussed in multi-disciplinary team meeting  -- Observation Level : q15 minute checks  -- Vital signs:  q12 hours  -- Precautions: suicide, elopement, and assault -- Encouraged patient to participate in unit milieu and in scheduled group therapies  2. Psychiatric Diagnoses and Treatment:  Effexor Xr 150 mg daily Continue Abilify 5 mg at bedtime for mood stabilization given the intensity of depression.  Discussed thoroughly the side effects risk benefits of Abilify and patient gave consent for the medication.  Added trazodone 50 mg nightly for sleep  3. Medical Issues Being Addressed:  None today   4. Discharge Planning:  Anticipated discharge tomorrow   Lewanda Rife, MD

## 2023-05-19 NOTE — Plan of Care (Signed)
   Problem: Education: Goal: Emotional status will improve Outcome: Progressing Goal: Mental status will improve Outcome: Progressing   Problem: Activity: Goal: Interest or engagement in activities will improve Outcome: Progressing

## 2023-05-19 NOTE — Group Note (Signed)
 Recreation Therapy Group Note   Group Topic:Stress Management  Group Date: 05/19/2023 Start Time: 1100 End Time: 1140 Facilitators: Rosina Lowenstein, LRT, CTRS Location:  Craft Room  Group Description: Meditation. LRT and patients discussed what they know about meditation and mindfulness. LRT played a Deep Breathing Meditation exercise script for patients to follow along to. LRT and patients discussed how meditation and deep breathing can be used as a coping skill post--discharge to help manage symptoms of stress.   Goal Area(s) Addressed: Patient will practice using relaxation technique. Patient will identify a new coping skill.  Patient will follow multistep directions to reduce anxiety and stress.   Affect/Mood: Appropriate   Participation Level: Active   Participation Quality: Independent   Behavior: Calm and Cooperative   Speech/Thought Process: Coherent   Insight: Fair   Judgement: Fair    Modes of Intervention: Activity, Education, and Exploration   Patient Response to Interventions:  Attentive, Engaged, and Receptive   Education Outcome:  Acknowledges education   Clinical Observations/Individualized Feedback: [name] was active in their participation of session activities and group discussion. Pt came late to group, however joined with no issue. Pt followed along to the prompt appropriately. Pt interacted well with LRT and peers duration of session.    Plan: Continue to engage patient in RT group sessions 2-3x/week.   [address] LRT, CTRS 05/19/2023 1:53 PM

## 2023-05-19 NOTE — BHH Suicide Risk Assessment (Signed)
 [facility] Discharge Suicide Risk Assessment   Principal Problem: MDD (major depressive disorder), recurrent severe, without psychosis ([name]) Discharge Diagnoses: Principal Problem:   MDD (major depressive disorder), recurrent severe, without psychosis ([name]) Active Problems:   GAD (generalized anxiety disorder)   Tremor    Psychiatric Specialty Exam:   Presentation  General Appearance:  Appropriate for Environment; Casual   Eye Contact: Fair   Speech: Clear and Coherent   Speech Volume: Normal       Mood and Affect  Mood: "good"   Affect: Stable, more animated     Thought Process  Thought Processes: Coherent   Descriptions of Associations:Intact   Orientation:Full (Time, Place and Person)   Thought Content:Logical   Hallucinations:Hallucinations: None   Ideas of Reference:None   Suicidal Thoughts:Suicidal Thoughts: No   Homicidal Thoughts:Homicidal Thoughts: No     Sensorium  Memory: Immediate Fair; Recent Fair; Remote Fair   Judgment: Fair   Insight: Fair     Art therapist  Concentration: Fair   Attention Span: Fair   Recall: [employer] of Knowledge: Fair   Language: Fair     Psychomotor Activity  Psychomotor Activity: Psychomotor Activity: Normal   Musculoskeletal: Strength & Muscle Tone: Improved Gait & Station: Improved, patient walks with the help of a walker Assets  Assets: Manufacturing systems engineer; Desire for Improvement; Resilience   Physical Exam: Physical Exam Vitals and nursing note reviewed.  HENT:     Head: Normocephalic.     Nose: No congestion.     Mouth/Throat:     Mouth: Mucous membranes are moist.  Eyes:     Pupils: Pupils are equal, round, and reactive to light.  Cardiovascular:     Rate and Rhythm: Normal rate.     Pulses: Normal pulses.  Pulmonary:     Breath sounds: Normal breath sounds.  Abdominal:     General: Bowel sounds are normal.  Neurological:     General: No focal deficit  present.     Mental Status: He is alert.      Review of Systems  Constitutional:  Negative for chills and fever.  HENT: Negative.    Eyes: Negative.   Cardiovascular: Negative.   Gastrointestinal: Negative.   Skin: Negative.    Blood pressure 133/89, pulse (!) 118, temperature 98.6 F (37 C), resp. rate 17, height 6\' 1"  (1.854 m), weight 60.6 kg, SpO2 95%. Body mass index is 17.61 kg/m.   Demographic Factors:  Male and Unemployed  Loss Factors: Decrease in vocational status and Decline in physical health   Risk Reduction Factors:   Sense of responsibility to family, Positive social support, Positive therapeutic relationship, and Positive coping skills or problem solving skills  Continued Clinical Symptoms:  Previous Psychiatric Diagnoses and Treatments Medical Diagnoses and Treatments/Surgeries  Cognitive Features That Contribute To Risk:  Thought constriction (tunnel vision)    Suicide Risk:  Minimal: No identifiable suicidal ideation.     Follow-up Information     [facility] Go on 06/15/2023.   Why: Your appointment is scheduled for 10:30 AM. Please bring your insurance card and ID. Contact information: [address] [place] [address] [phone]                 Plan Of Care/Follow-up recommendations:  Per discharge summary  Lewanda Rife, MD

## 2023-05-19 NOTE — Group Note (Signed)
 Recreation Therapy Group Note   Group Topic:Communication  Group Date: 05/19/2023 Start Time: 1445 End Time: 1535 Facilitators: Rosina Lowenstein, LRT, CTRS Location: Courtyard  Group Description: Emotional Check in. Patient sat and talked with LRT about how they are doing and whatever else is on their mind. LRT provided active listening, reassurance and encouragement. Pts were given the opportunity to listen to music or color mandalas while they talk.    Goal Area(s) Addressed: Patient will engage in conversation with LRT. Patient will communicate their wants, needs, or questions.  Patient will practice a new coping skill of "talking to someone".   Affect/Mood: N/A   Participation Level: Did not attend    Clinical Observations/Individualized Feedback: Patient did not attend group.   Plan: Continue to engage patient in RT group sessions 2-3x/week.   Rosina Lowenstein, LRT, CTRS 05/19/2023 5:08 PM

## 2023-05-19 NOTE — Progress Notes (Signed)
 Patient is pleasant and cooperative.  Appropriate affect.  Endorses anxiety and depression, but states it is improving.  Denies SI/HI and AVH.  Denies pain.  Reports he slept well.    Compliant with scheduled medications.  PRN anxiety medication given.  15 min checks in place for safety.  Patient is present in the milieu. Appropriate interaction with peers and staff.

## 2023-05-20 DIAGNOSIS — F332 Major depressive disorder, recurrent severe without psychotic features: Secondary | ICD-10-CM | POA: Diagnosis not present

## 2023-05-20 NOTE — Care Management Important Message (Signed)
 Important Message  Patient Details  Name: [name] MRN: [id number] Date of Birth: Jun 17, 1965   Important Message Given:  Yes - Medicare IM     Elza Rafter, LCSWA 05/20/2023, 9:22 AM

## 2023-05-20 NOTE — Plan of Care (Signed)
 ?  Problem: Activity: ?Goal: Interest or engagement in activities will improve ?Outcome: Progressing ?Goal: Sleeping patterns will improve ?Outcome: Progressing ?  ?Problem: Coping: ?Goal: Ability to verbalize frustrations and anger appropriately will improve ?Outcome: Progressing ?Goal: Ability to demonstrate self-control will improve ?Outcome: Progressing ?  ?Problem: Safety: ?Goal: Periods of time without injury will increase ?Outcome: Progressing ?  ?

## 2023-05-20 NOTE — Progress Notes (Signed)
   05/19/23 2000  Psych Admission Type (Psych Patients Only)  Admission Status Voluntary  Psychosocial Assessment  Patient Complaints Anxiety;Depression  Eye Contact Fair  Facial Expression Animated  Affect Appropriate to circumstance  Speech Logical/coherent  Interaction Assertive  Motor Activity Slow  Appearance/Hygiene In scrubs  Behavior Characteristics Appropriate to situation  Mood Pleasant  Thought Process  Coherency WDL  Content WDL  Delusions None reported or observed  Perception WDL  Hallucination None reported or observed  Judgment Impaired  Confusion None  Danger to Self  Current suicidal ideation? Denies   Patient alert and oriented. States he's okay. Endorses anxiety and depression 8/10. Pt said "I feel like I'm not going to make it but feeling much better than before." States he's ready to go home and see his parents. Visible in dayroom playing chess. Interacting with peers. Attend groups. Scheduled medications administered to patient, per MD orders. PRN given for anxiety.  Support and encouragement provided. Routine safety checks conducted every 15 minutes. Patient informed to notify staff with problems or concerns. Denies SI/HI/AVH and pain. Patient remains safe at this time.

## 2023-05-20 NOTE — Discharge Summary (Signed)
 Physician Discharge Summary Note  Patient:  [name] is an 58 y.o., male MRN:  [id number] DOB:  [birth year] Patient phone:  [phone] (home)  Patient address:   [address] [place] [address],    Date of Admission:  04/13/2023 Date of Discharge: 05/20/2023  Reason for Admission:  [name] is a 58 year old Caucasian male with a history of MDD, anxiety, Crohn's disease, and chronic mesenteric ischemia who presented to [facility] emergency department on 04/13/23 at 0404 for suicide attempt by ingestion of 28 tablets of 10mg  Lexapro at 0200. Per ED documentation, patient had some mild symptoms of serotonin syndrome on arrival, including acute agitation and clonus, with symptomatic improvement with benzodiazepines. Did have a return of symptoms requiring additional dose of lorazepam. Poison control advised the total amount of ingested Lexapro likely is not enough to cause serotonin syndrome by itself and they recommended 8-hour cardiac monitoring. Qtc 498.   Patient was recently hospitalized at [facility] 03/28/23-04/04/23 for SI. He presented to the [facility] ED on 04/04/23 for dehydration. He presented to [facility] on 04/11/23 requesting medications for anxiety and panic at which time Lexapro was increased from 5mg  to 10mg , Remeron was continued, and trial of buspirone was initiated.  Principal Problem: MDD (major depressive disorder), recurrent severe, without psychosis ([name]) Discharge Diagnoses: Principal Problem:   MDD (major depressive disorder), recurrent severe, without psychosis ([name]) Active Problems:   GAD (generalized anxiety disorder)   Tremor   Past Psychiatric History: Diagnostic history of MDD and GAD. Past suicide attempts and inpatient hospitalizations. No trauma history. No history of aggression.   Past Medical History:  Past Medical History:  Diagnosis Date   Allergy    Anal fistula    Anxiety    Arthritis    ? of migratory arthritis   Autoimmune  hepatitis ([name]) 01/19/2013   liver function checked every 2 or 3 months sees dr Leone Payor   Avoidant-restrictive food intake disorder (ARFID) ? 06/15/2018   Cancer of skin of neck    Cataract    Chronic mesenteric ischemia ([name])    Chronic pain syndrome 07/09/2016   Crohn's disease of small and large intestines ([name])    followed by dr Baldo Ash gessner   Dairy product intolerance    Diarrhea, functional    Family history of adverse reaction to anesthesia    Grover's disease    transient acantholytic dermatosis   History of alcohol abuse    History of basal cell carcinoma excision    2013 left leg   History of Clostridium difficile    10/ 2014   History of multiple concussions    x6   last one Jan 2017 per pt--  no residual   History of substance abuse ([name])    quit 1997 per pt   History of suicide attempt    05-18-2012  overdose /  failure to thrive   Iron deficiency anemia due to chronic blood loss    Major depression, recurrent, chronic ([name])    Osteopenia    Personal history of adenomatous colonic polyps 12/2010, 03/2012   12/2010 - 8 mm serrated adenoma of rectum   Portal vein thrombosis 03/21/2015   right   Primary sclerosing cholangitis    ? 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   Substance abuse ([name]) 1997   Alcohol   Vitamin A deficiency 05/23/2018   Vitamin B6 deficiency 07/27/2018   Vitamin  C deficiency 05/23/2018    Past Surgical History:  Procedure Laterality Date   ABDOMINAL AORTAGRAM N/A 03/29/2012   Procedure: ABDOMINAL AORTAGRAM;  Surgeon: Nada Libman, MD;  Location: [facility] CATH LAB;  Service: Cardiovascular;  Laterality: N/A;   ADENOIDECTOMY  age 1   BIOPSY  05/31/2018   Procedure: BIOPSY;  Surgeon: Rachael Fee, MD;  Location: [facility] ENDOSCOPY;  Service: Endoscopy;;   CATARACT EXTRACTION W/ INTRAOCULAR LENS  IMPLANT, BILATERAL  2009   COLONOSCOPY  2001, 05/02/2003, 01/28/11   2012: Right  colon Crohn's, rectal polyp   COLONOSCOPY  03/31/2012   Procedure: COLONOSCOPY;  Surgeon: Beverley Fiedler, MD;  Location: [facility] ENDOSCOPY;  Service: Gastroenterology;  Laterality: N/A;   COLONOSCOPY WITH PROPOFOL N/A 05/31/2018   Procedure: COLONOSCOPY WITH PROPOFOL;  Surgeon: Rachael Fee, MD;  Location: [facility] ENDOSCOPY;  Service: Endoscopy;  Laterality: N/A;   ESOPHAGOGASTRODUODENOSCOPY  01/28/2011   Normal   FOOT SURGERY Right age 14   MOHS SURGERY Left 11/2013   left ankle parakerotosis    PERCUTANEOUS LIVER BIOPSY  2007 and 2008   PILONIDAL CYST EXCISION  age 12   PLACEMENT OF SETON N/A 11/06/2015   Procedure: PLACEMENT OF SETON;  Surgeon: Romie Levee, MD;  Location: [facility];  Service: General;  Laterality: N/A;   PLACEMENT OF SETON  07/2018   at [facility]   RECTAL EXAM UNDER ANESTHESIA N/A 01/18/2019   Procedure: ANAL EXAM UNDER ANESTHESIA,  INCISION AND DRAINAGE, SETON PLACEMENT;  Surgeon: Romie Levee, MD;  Location: [facility];  Service: General;  Laterality: N/A;   UPPER GASTROINTESTINAL ENDOSCOPY     Family History:  Family History  Problem Relation Age of Onset   Heart disease Father    Thyroid cancer Father    Allergies Father    Clotting disorder Father    Breast cancer Mother    Stomach cancer Mother    Colon cancer Neg Hx    Esophageal cancer Neg Hx    Rectal cancer Neg Hx    Family Psychiatric  History:  Patient reports depression and GAD in his mother  Social History:  Social History   Substance and Sexual Activity  Alcohol Use No   Alcohol/week: 0.0 standard drinks of alcohol     Social History   Substance and Sexual Activity  Drug Use Not Currently   Types: Cocaine, Marijuana   Comment: QUIT USING DRUGS IN 1997    Social History   Socioeconomic History   Marital status: Single    Spouse name: Not on file   Number of children: 1   Years of education: Not on file   Highest education level: Not on file   Occupational History   Occupation: Disability   Tobacco Use   Smoking status: Former    Current packs/day: 0.00    Average packs/day: 1 pack/day for 15.0 years (15.0 ttl pk-yrs)    Types: Cigarettes    Start date: 03/28/1982    Quit date: 03/28/1997    Years since quitting: 26.1   Smokeless tobacco: Never  Vaping Use   Vaping status: Never Used  Substance and Sexual Activity   Alcohol use: No    Alcohol/week: 0.0 standard drinks of alcohol   Drug use: Not Currently    Types: Cocaine, Marijuana    Comment: QUIT USING DRUGS IN 1997   Sexual activity: Not Currently  Other Topics Concern   Not on file  Social History Narrative   Single,  history of substance abuse in recovery   Previously occupied on medical disability   1 child  Daughter Wellsite geologist, lives and works in [place]   Elderly parents involved and he helps them   1 brother   Social Drivers of Corporate investment banker Strain: Low Risk  (08/28/2019)   Overall Financial Resource Strain (CARDIA)    Difficulty of Paying Living Expenses: Not hard at all  Food Insecurity: Food Insecurity Present (04/13/2023)   Hunger Vital Sign    Worried About Running Out of Food in the Last Year: Never true    Ran Out of Food in the Last Year: Sometimes true  Transportation Needs: No Transportation Needs (04/13/2023)   PRAPARE - Administrator, Civil Service (Medical): No    Lack of Transportation (Non-Medical): No  Physical Activity: Insufficiently Active (11/08/2019)   Exercise Vital Sign    Days of Exercise per Week: 3 days    Minutes of Exercise per Session: 30 min  Stress: Stress Concern Present (08/28/2019)   [employer] of Occupational Health - Occupational Stress Questionnaire    Feeling of Stress : Very much  Social Connections: Socially Isolated (03/28/2023)   Social Connection and Isolation Panel [NHANES]    Frequency of Communication with Friends and Family: Three times a week    Frequency of Social  Gatherings with Friends and Family: Twice a week    Attends Religious Services: Never    Database administrator or Organizations: No    Attends Banker Meetings: Never    Marital Status: Divorced    Hospital Course:  The patient was admitted to Inpatient psychiatric treatment for stabilization of depression and suicidal ideations. Patient was placed on suicidal precautions. The patient was evaluated and treated by the multidisciplinary treatment team including physicians, nurses, social workers and therapists. All medications were presented to the patient and the Patient gave consent to all the medications that they were given, as well as was explained the risks, benefits, side effects and alternatives of all medication therapies. The patient was integrated into the general milieu on the ward and encouraged to attend to his ADLs and participate in all groups and activities. During hospital course the Patient attended coping skill groups, music therapy and activity therapy groups. Patient was counseled on cognitive techniques/skills by multiple staff members and given support care by the staff.   Patient's medication regimen was evaluated and titrated to therapeutic levels to better Patient's overall daily functioning. Specifically, the patient was started on Abilify and Effexor he tolerated the medication well with no significant side effects. During hospitalization patient was evaluated by PT/OT.  Please refer to their notes for detailed information.  Home health services were considered, but towards the end of patient's stay patient was able to maintain his ADLs.  Hospitalist was consulted for medical needs.  Patient's family is involved in patient's care.  It is noted that patient has a support of mother and brother.  Patient was discharged after stabilization of mood and medical conditions.  During the hospitalization, the patient demonstrated a stabilization of mood and depression with   improved sleep and appetite. At the time of discharge, the patient denied any suicidal ideation/homicidal ideation and was not overtly depressed, manic or psychotic. The Patient was interacting well in groups and on the unit with their peers. Patient was able to identify a safety plan to include speaking with family, contacting outpatient provider or calling 911 if hallucinations/delusions returned or  worsened or thoughts of self-harm or suicide return. Patient was counselled on outpatient follow-up that was arranged prior to discharge.  Psychiatric Specialty Exam:   Presentation  General Appearance:  Appropriate for Environment; Casual   Eye Contact: Fair   Speech: Clear and Coherent   Speech Volume: Normal       Mood and Affect  Mood: "good"   Affect: Stable, more animated     Thought Process  Thought Processes: Coherent   Descriptions of Associations:Intact   Orientation:Full (Time, Place and Person)   Thought Content:Logical   Hallucinations:Hallucinations: None   Ideas of Reference:None   Suicidal Thoughts:Suicidal Thoughts: No   Homicidal Thoughts:Homicidal Thoughts: No     Sensorium  Memory: Immediate Fair; Recent Fair; Remote Fair   Judgment: Fair   Insight: Fair     Art therapist  Concentration: Fair   Attention Span: Fair   Recall: [employer] of Knowledge: Fair   Language: Fair     Psychomotor Activity  Psychomotor Activity: Psychomotor Activity: Normal   Musculoskeletal: Strength & Muscle Tone: Improved Gait & Station: Improved, patient walks with the help of a walker Assets  Assets: Manufacturing systems engineer; Desire for Improvement; Resilience   Physical Exam: Physical Exam Vitals and nursing note reviewed.  HENT:     Head: Normocephalic.     Nose: No congestion.     Mouth/Throat:     Mouth: Mucous membranes are moist.  Eyes:     Pupils: Pupils are equal, round, and reactive to light.  Cardiovascular:      Rate and Rhythm: Normal rate.     Pulses: Normal pulses.  Pulmonary:     Breath sounds: Normal breath sounds.  Abdominal:     General: Bowel sounds are normal.  Neurological:     General: No focal deficit present.     Mental Status: He is alert.      Review of Systems  Constitutional:  Negative for chills and fever.  HENT: Negative.    Eyes: Negative.   Cardiovascular: Negative.   Gastrointestinal: Negative.   Skin: Negative.    Blood pressure 118/82, pulse 71, temperature 97.9 F (36.6 C), resp. rate 17, height 6\' 1"  (1.854 m), weight 60.6 kg, SpO2 100%. Body mass index is 17.61 kg/m.   Social History   Tobacco Use  Smoking Status Former   Current packs/day: 0.00   Average packs/day: 1 pack/day for 15.0 years (15.0 ttl pk-yrs)   Types: Cigarettes   Start date: 03/28/1982   Quit date: 03/28/1997   Years since quitting: 26.1  Smokeless Tobacco Never   Tobacco Cessation:  N/A, patient does not currently use tobacco products   Blood Alcohol level:  Lab Results  Component Value Date   ETH <10 04/13/2023   ETH <10 03/28/2023    Metabolic Disorder Labs:  Lab Results  Component Value Date   HGBA1C 4.8 04/03/2023   MPG 91.06 04/03/2023   No results found for: "PROLACTIN" Lab Results  Component Value Date   CHOL 209 (H) 04/03/2023   TRIG 75 04/03/2023   HDL 65 04/03/2023   CHOLHDL 3.2 04/03/2023   VLDL 15 04/03/2023   LDLCALC 129 (H) 04/03/2023    See Psychiatric Specialty Exam and Suicide Risk Assessment completed by Attending Physician prior to discharge.  Discharge destination:  Home  Is patient on multiple antipsychotic therapies at discharge:  No     Recommended Plan for Multiple Antipsychotic Therapies: NA  Discharge Instructions     Home Health  Complete by: As directed    To provide the following care/treatments:  PT Home Health Aide OT        Allergies as of 05/20/2023       Reactions   Asacol [mesalamine] Diarrhea   abd pain    Azathioprine Diarrhea   abd pain   Gluten Meal Diarrhea   Metoprolol Tartrate Nausea Only   Stomach upset   Mycophenolate Mofetil Diarrhea   abd pain   Other Other (See Comments)   NUTS; constipation   Wheat Diarrhea   Constipation; flatulence; abd pain   Amoxicillin Other (See Comments)   Lots of gas   Tape Itching, Rash   Please use "paper" tape        Medication List     STOP taking these medications    busPIRone 10 MG tablet Commonly known as: BUSPAR   dicyclomine 10 MG capsule Commonly known as: BENTYL   escitalopram 10 MG tablet Commonly known as: LEXAPRO   ketoconazole 2 % cream Commonly known as: NIZORAL   mirtazapine 30 MG tablet Commonly known as: REMERON       TAKE these medications      Indication  ARIPiprazole 5 MG tablet Commonly known as: ABILIFY Take 1 tablet (5 mg total) by mouth at bedtime.    doxycycline 100 MG tablet Commonly known as: VIBRA-TABS Take 1 tablet (100 mg total) by mouth every 12 (twelve) hours.    hydrOXYzine 25 MG tablet Commonly known as: ATARAX Take 1 tablet (25 mg total) by mouth 3 (three) times daily as needed for anxiety.    LORazepam 1 MG tablet Commonly known as: ATIVAN Take 1 tablet (1 mg total) by mouth every 4 (four) hours as needed (tremors, psychomotor agitation). What changed:  medication strength how much to take when to take this reasons to take this    melatonin 5 MG Tabs Take 1 tablet (5 mg total) by mouth at bedtime.    multivitamin with minerals Tabs tablet Take 1 tablet by mouth daily.    neomycin-bacitracin-polymyxin [phone] Oint Apply 1 Application topically every 6 (six) hours as needed.    predniSONE 20 MG tablet Commonly known as: DELTASONE Take 1.5 tablets (30 mg total) by mouth daily with breakfast.  Indication: Crohns   venlafaxine XR 150 MG 24 hr capsule Commonly known as: EFFEXOR-XR Take 1 capsule (150 mg total) by mouth daily with breakfast.         Follow-up  Information     [facility] Go on 06/15/2023.   Why: Your appointment is scheduled for 10:30 AM. Please bring your insurance card and ID. Contact information: [address] [place] [address] [phone]                PATIENTS CONDITION AT DISCHARGE: Stable TOBACCO CESSATION SCREENING  Patient was screened and counselled on smoking cessation at time of discharge.    PRESCRIPTION ARE LOCATED: On Chart  DISCHARGE INSTRUCTIONS: Diet: Cardiac healthy Activity: As tolerated Take medications as prescribed and not to make any changes without first consulting with the outpatient provider. Patient was advised to avoid any illicit drugs or alcohol due to negative impact on physical and mental health.  Patient should keep all follow up appointments.  TIME SPENT ON DISCHARGE: Over 35 minutes were spent on this patient's discharge including a face-to-face encounter, patient counseling, and preparation of discharge materials.      Signed: Lewanda Rife, MD 05/20/2023, 1:31 PM

## 2023-05-20 NOTE — Progress Notes (Signed)
  [facility] Adult Case Management Discharge Plan :  Will you be returning to the same living situation after discharge:  Yes,  pt will return home  At discharge, do you have transportation home?: Yes,  pt's brother will pick him up  Do you have the ability to pay for your medications: Yes,  MEDICARE / MEDICARE PART A AND B  Release of information consent forms completed and in the chart;  Patient's signature needed at discharge.  Patient to Follow up at:  Follow-up Information     [facility] Go on 06/15/2023.   Why: Your appointment is scheduled for 10:30 AM. Please bring your insurance card and ID. Contact information: [address] [place] [address] [phone]                 Next level of care provider has access to [facility] Link:no  Safety Planning and Suicide Prevention discussed: [place], [place], [phone]     Has patient been referred to the Quitline?: Patient does not use tobacco/nicotine products  Patient has been referred for addiction treatment: No known substance use disorder.  [address] LCSWA 05/20/2023, 9:20 AM

## 2023-05-20 NOTE — Plan of Care (Signed)
 D: Pt alert and oriented. Pt reports experiencing anxiety/depression at this time. Pt denies experiencing any pain at this time. Pt denies experiencing any SI/HI, or AVH at this time.   A: Scheduled medications administered to pt, per MD orders. Support and encouragement provided. Frequent verbal contact made. Routine safety checks conducted q15 minutes.   R: No adverse drug reactions noted. Pt verbally contracts for safety at this time. Pt compliant with medications and treatment plan. Pt interacts well with others on the unit. Pt remains safe at this time. Plan of care ongoing.  Problem: Education: Goal: Emotional status will improve Outcome: Not Progressing   Problem: Activity: Goal: Interest or engagement in activities will improve Outcome: Progressing

## 2023-05-20 NOTE — BHH Counselor (Signed)
 CSW referred pt to following PHP Programs.   [facility], who declined because they feel a virtual program is not for him.   Carlena Bjornstad- CSW completed referral over the phone. CSW faxed over progress notes, MAR and H&P, and ROI. Fax number: [phone] (ATTN: T.King). Follow-up Monday.   CSW will inform pt's family of referrals via email.   Reynaldo Minium, [name], [place] 05/20/2023 9:14 AM

## 2023-05-20 NOTE — Progress Notes (Signed)
 D: Pt alert and oriented. Pt denies experiencing any pain, SI/HI, or AVH at this time. Pt reports he will be able to keep himself safe when he returns home. Pt has completed a suicide safety plan and was given a survey to fill out. Transition Record, AVS, and SRA was reviewed with pt and given upon discharge.  A: Pt received discharge and medication education/information. Pt belongings were returned and signed for at this time.   R: Pt verbalized understanding of discharge and medication education/information.  Pt escorted by staff via wheelchair to medical mall front lobby where pt was picked up by his brother.

## 2023-05-25 ENCOUNTER — Encounter: Payer: Self-pay | Admitting: Internal Medicine

## 2023-05-25 DIAGNOSIS — M9903 Segmental and somatic dysfunction of lumbar region: Secondary | ICD-10-CM | POA: Diagnosis not present

## 2023-05-26 DIAGNOSIS — K5090 Crohn's disease, unspecified, without complications: Secondary | ICD-10-CM | POA: Diagnosis not present

## 2023-05-30 ENCOUNTER — Ambulatory Visit (INDEPENDENT_AMBULATORY_CARE_PROVIDER_SITE_OTHER): Payer: Medicare Other | Admitting: Internal Medicine

## 2023-05-30 ENCOUNTER — Encounter: Payer: Self-pay | Admitting: Internal Medicine

## 2023-05-30 VITALS — BP 130/88 | HR 94 | Ht 73.0 in | Wt 165.0 lb

## 2023-05-30 DIAGNOSIS — Z7960 Long term (current) use of unspecified immunomodulators and immunosuppressants: Secondary | ICD-10-CM

## 2023-05-30 DIAGNOSIS — G894 Chronic pain syndrome: Secondary | ICD-10-CM

## 2023-05-30 DIAGNOSIS — K50813 Crohn's disease of both small and large intestine with fistula: Secondary | ICD-10-CM

## 2023-05-30 DIAGNOSIS — K7460 Unspecified cirrhosis of liver: Secondary | ICD-10-CM

## 2023-05-30 DIAGNOSIS — K8301 Primary sclerosing cholangitis: Secondary | ICD-10-CM

## 2023-05-30 DIAGNOSIS — K754 Autoimmune hepatitis: Secondary | ICD-10-CM | POA: Diagnosis not present

## 2023-05-30 DIAGNOSIS — Z7952 Long term (current) use of systemic steroids: Secondary | ICD-10-CM | POA: Diagnosis not present

## 2023-05-30 DIAGNOSIS — K50119 Crohn's disease of large intestine with unspecified complications: Secondary | ICD-10-CM

## 2023-05-30 MED ORDER — NA SULFATE-K SULFATE-MG SULF 17.5-3.13-1.6 GM/177ML PO SOLN: 1.0000 | ORAL | 0 refills | Status: DC

## 2023-05-30 MED ORDER — GERHARDT'S BUTT CREAM: TOPICAL_CREAM | CUTANEOUS | 5 refills | Status: DC

## 2023-05-30 NOTE — Patient Instructions (Signed)
 You have been scheduled for a colonoscopy. Please follow written instructions given to you at your visit today.   If you use inhalers (even only as needed), please bring them with you on the day of your procedure.  We are providing you with a rx for Gerhardt's Butt Cream to take to your pharmacy.  _______________________________________________________  If your blood pressure at your visit was 140/90 or greater, please contact your primary care physician to follow up on this.  _______________________________________________________  If you are age 68 or older, your body mass index should be between 23-30. Your Body mass index is 21.77 kg/m. If this is out of the aforementioned range listed, please consider follow up with your Primary Care Provider.  If you are age 65 or younger, your body mass index should be between 19-25. Your Body mass index is 21.77 kg/m. If this is out of the aformentioned range listed, please consider follow up with your Primary Care Provider.   ________________________________________________________  The [facility] providers would like to encourage you to use [facility] to communicate with providers for non-urgent requests or questions.  Due to long hold times on the telephone, sending your provider a message by [facility] may be a faster and more efficient way to get a response.  Please allow 48 business hours for a response.  Please remember that this is for non-urgent requests.  _______________________________________________________  I appreciate the opportunity to care for you. Stan Head, MD, [facility]

## 2023-05-30 NOTE — Progress Notes (Signed)
 [name] 58 y.o. [birth year] [id number]  Assessment & Plan:   Encounter Diagnoses  Name Primary?   Crohn's disease of both small and large intestine with fistula ([name]) Yes   Crohn's disease of perianal region with complication ([name])    Autoimmune hepatitis ([name])    Cirrhosis of liver without ascites, unspecified hepatic cirrhosis type ([name])    Primary sclerosing cholangitis    Long-term use of immunosuppressant medication - Entyvio    Chronic pain syndrome    Long term current use of systemic steroids     Colonoscopy in June to reassess and do surveillance of his Crohn's colitis.  He has large numbers of pseudopolyps also.  Given recent suicide attempt and depression, I explained to [name] I was not going to prescribe hydrocodone again.  He should consider a pain clinic perhaps.  He may discuss with Dr. Tenny Craw.  He has been dependent on higher doses of prednisone for years.  He is aware of the potential risks.  He is down to 20 mg.  We did discuss potentially reducing to 15 mg at a month if he is still doing well.  Liver disease biochemically in remission.  Last liver imaging was with MRI in September 2023.  Will discuss repeating MRI after colonoscopy.  [name] is improved regarding mental health issues but I do not want to overwhelm him with testing etc.  He has perianal erythema consistent with dermatitis, he has perianal fistula and I do not see evidence of abscess today.  There is skin breakdown posteriorly as outlined in the images below, he says it is where his pilonidal cyst was treated with surgery years ago.  I do not know if this is some sort of pressure ulcer it really does not look like that.  It is clean, will treat that area with Gerhardt's Butt cream for the time being.  May need to return to colorectal surgery for evaluation.  Reassess at colonoscopy.   Subjective:   Gastroenterology summary:  Crohn's disease of small and large intestine with history of  fistula and perianal abscess   COLONOSCOPY/PATH DX 2001, REPEAT 2005 PATH NEG 2012: Active Crohn's right colon - ileum ok Intolerant of immunomodulators, CellCept, also intolerant of AZA though questionable. 2014 Crohn's flare and ischemic colitis Summer 2014 off prednisone and back on Humira 2015 colonoscopy ileocolonic inflammation no I think improved 08/30/2016 - Entyvio to start November 2021 vedolizumab trough 9.4 mcg/dL going to monthly vedolizumab 2022 every 6-week vedolizumab March 2023 colonoscopy with pseudopolyps/inflammatory polyps but mucosal biopsies were without active inflammatory bowel disease   Status post colorectal surgery care with seton placement in the past for perirectal fistula and abscess   Posterior anal fissure on and off also   Iron deficiency anemia chronic and recurrent has been treated with parenteral iron at [facility] where he gets his Entyvio infusions   Autoimmune hepatitis-PSC overlap with cirrhosis (also history of alcohol abuse)   AbnormalLFT's seen 8/ 2001 Marked elevation of transaminases Feb 2007, alkaline phosphatase 157, coags normal Positive anti-smooth muscle antibody at 1-80 February 2007, F. Acton antibody IgG 57 which is elevated, other serologies and celiac profile negative Liver biopsy May 11, 2005: Mild chronic hepatitis inflammation grade 1, no fibrosis identified, 2+ cirrhosis, prednisone begun with apparent response, reduction in transaminases. The patient has always had minimal symptomatology Immune to HAV and HBV Treated with prednisone and azathioprine though azathioprine stopped by patient, trial of Cellcept stopped by patient Liver biopsy #2 August 31, 2006 chronic mildly active hepatitis with focal portal fibrosis, iron staining negative, SPEP normal, ANA negative anti-smooth muscle antibody still positive 01/11/07 MR/MRCP right hepatic lobe ducts irregular and beaded consistent with PSC, minimal perihepatic  ascites, o/w negative [facility] evaluation, Dr. Ardine Eng April and May 2009, MRCP suggests [facility] and raises ? of cholangiocarcinoma vs fibrosis right lobe of liver, CA 19-9 87 (NL <40), follow-up at [facility] or here suggested with repeat liver biospy to be considered. MR earlier 2010 without signs of cholabgiocarcinoma. Transaminase elevations have been fairly asymptomatic and reduction in levels has not always correlated with prednisone use. MR MRCP 01/08/2011 shows progressive changes of inflammation in the right hepatic lobe and ducts without mass lesion 12/2011 MRCP 1. Slowly progressive hepatic fibrosis secondary to underlying  sclerosing cholangitis. No mass lesion is identified to suggest  cholangiocarcinoma.  2. Stable intrahepatic biliary dilatation and beading of the  common bile duct. Possible tiny gallstones. No evidence of  choledocholithiasis. 12/2012 liver bx CHRONIC HEPATITIS WITH MODERATE ACTIVITY AND BRIDGING FIBROSIS restart prednisone 40 mg daily   July 2021 A. LIVER, LEFT, RANDOM, BIOPSY:  - Features of mild large bile duct obstruction, compatible with history  of primary sclerosing cholangitis. See comment  - Minimal portal fibrosis, Stage 0-1 of 4.      MRI/MRCP with and without contrast 11/02/2021 Unchanged appearance of liver compared to 2021 with severe atrophy in the posterior right lobe segments 6 and 8, beaded ducts consistent with PSC plus segmental intrahepatic biliary ductal dilation no common bile duct dilation and no suspicious liver lesions or enhancement     Portal vein thrombosis treated and resolved with anticoagulation though has left atrophic liver segments as above     History of mesenteric ischemia problems SMA stenosis and briefly went to hospice at this time as he decided to stop all treatment but recovered    Chronic steroid dependence Has not been able to wean steroids ever, is aware of the potential risks.  In general he can only take 20 mg prednisone  tablets.  Dose ranges from 20 to 30 mg daily.   Chronic opioid use with chronic pain syndrome Years of hydrocodone 5-325 mg 1 each day.  August 2024 stopped due to components of the pill (titanium and silica dioxide) but the patient perceives cause gastrointestinal disturbance.  Tried a formulation of acetaminophen from compound pharmacy but did not tolerate.  March 2025-will not represcribe hydrocodone.   Chronic recurrent depression with a history of avoidant restrictive food intake disorder and anxiety and previous suicide attempt   ---------------------------------------------------------------------------------------------  Chief Complaint: Crohn's disease follow-up  HPI 58 year old man with a complicated history of Crohn's disease of the large and small intestine, perianal disease, fistula, PSC autoimmune hepatitis overlap with cirrhosis who presents for follow-up.  He was admitted to psychiatry after suicide attempt by ingesting 20 tablets of Lexapro.  He was also in behavioral health January 27 to February 3.  He reports that his Crohn's disease is under control he believes bowel movements are formed, no bleeding, no abdominal pain.  He missed an Entyvio infusion but got it rescheduled, schedule was disrupted due to inpatient behavioral health hospitalizations.  He is asking about restarting hydrocodone for pain "all over".  Back neck and rectal pain at times.  He was prescribed doxycycline because he had some purulent discharge and he thought he might of had a perianal abscess again.  He reports that has subsided.  Still remains fatigued.  Father is demented and  in failing health.  Mother in better shape.  Father is 35 mom is 83.  [name] lives with or spends a great deal of time with them. Lab Results  Component Value Date   WBC 12.1 (H) 04/14/2023   HGB 15.1 04/14/2023   HCT 44.4 04/14/2023   MCV 93.7 04/14/2023   PLT 219 04/14/2023     Lab Results  Component Value Date   ALT  39 04/20/2023   AST 35 04/20/2023   ALKPHOS 87 04/20/2023   BILITOT 0.4 04/20/2023    Allergies  Allergen Reactions   Asacol [Mesalamine] Diarrhea    abd pain   Azathioprine Diarrhea    abd pain   Gluten Meal Diarrhea   Metoprolol Tartrate Nausea Only    Stomach upset   Mycophenolate Mofetil Diarrhea    abd pain   Other Other (See Comments)    NUTS; constipation   Wheat Diarrhea    Constipation; flatulence; abd pain   Abilify [Aripiprazole] Diarrhea   Amoxicillin Other (See Comments)    Lots of gas   Tape Itching and Rash    Please use "paper" tape   Current Meds  Medication Sig   doxycycline (VIBRA-TABS) 100 MG tablet Take 1 tablet (100 mg total) by mouth every 12 (twelve) hours.   hydrOXYzine (ATARAX) 25 MG tablet Take 1 tablet (25 mg total) by mouth 3 (three) times daily as needed for anxiety.   LORazepam (ATIVAN) 1 MG tablet Take 1 tablet (1 mg total) by mouth every 4 (four) hours as needed (tremors, psychomotor agitation).   neomycin-bacitracin-polymyxin [phone] OINT Apply 1 Application topically every 6 (six) hours as needed.       predniSONE (DELTASONE) 20 MG tablet 20 mg total) by mouth daily with breakfast.   venlafaxine XR (EFFEXOR-XR) 150 MG 24 hr capsule Take 1 capsule (150 mg total) by mouth daily with breakfast.   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 06/15/2018   Cancer of skin of neck    Cataract    Chronic mesenteric ischemia ([name])    Chronic pain syndrome 07/09/2016   Crohn's disease of small and large intestines ([name])    followed by dr Baldo Ash Kamaree Berkel   Dairy product intolerance    Diarrhea, functional    Family history of adverse reaction to anesthesia    Grover's disease    transient acantholytic dermatosis    History of alcohol abuse    History of basal cell carcinoma excision    2013 left leg   History of Clostridium difficile    10/ 2014   History of multiple concussions    x6   last one Jan 2017 per pt--  no residual   History of substance abuse ([name])    quit 1997 per pt   History of suicide attempt    05-18-2012  overdose /  failure to thrive   Iron deficiency anemia due to chronic blood loss    Major depression, recurrent, chronic ([name])    Osteopenia    Personal history of adenomatous colonic polyps 12/2010, 03/2012   12/2010 - 8 mm serrated adenoma of rectum   Portal vein thrombosis 03/21/2015   right   Primary sclerosing cholangitis    ?  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   Substance abuse ([name]) 1997   Alcohol   Vitamin A deficiency 05/23/2018   Vitamin B6 deficiency 07/27/2018   Vitamin C deficiency 05/23/2018   Past Surgical History:  Procedure Laterality Date   ABDOMINAL AORTAGRAM N/A 03/29/2012   Procedure: ABDOMINAL Ronny Flurry;  Surgeon: Nada Libman, MD;  Location: [facility] CATH LAB;  Service: Cardiovascular;  Laterality: N/A;   ADENOIDECTOMY  age 80   BIOPSY  05/31/2018   Procedure: BIOPSY;  Surgeon: Rachael Fee, MD;  Location: [facility] ENDOSCOPY;  Service: Endoscopy;;   CATARACT EXTRACTION W/ INTRAOCULAR LENS  IMPLANT, BILATERAL  2009   COLONOSCOPY  2001, 05/02/2003, 01/28/11   2012: Right colon Crohn's, rectal polyp   COLONOSCOPY  03/31/2012   Procedure: COLONOSCOPY;  Surgeon: Beverley Fiedler, MD;  Location: [facility] ENDOSCOPY;  Service: Gastroenterology;  Laterality: N/A;   COLONOSCOPY WITH PROPOFOL N/A 05/31/2018   Procedure: COLONOSCOPY WITH PROPOFOL;  Surgeon: Rachael Fee, MD;  Location: [facility] ENDOSCOPY;  Service: Endoscopy;  Laterality: N/A;   ESOPHAGOGASTRODUODENOSCOPY  01/28/2011   Normal   FOOT SURGERY Right age 46   MOHS SURGERY Left 11/2013   left ankle parakerotosis    PERCUTANEOUS LIVER  BIOPSY  2007 and 2008   PILONIDAL CYST EXCISION  age 33   PLACEMENT OF SETON N/A 11/06/2015   Procedure: PLACEMENT OF SETON;  Surgeon: Romie Levee, MD;  Location: [facility];  Service: General;  Laterality: N/A;   PLACEMENT OF SETON  07/2018   at [facility]   RECTAL EXAM UNDER ANESTHESIA N/A 01/18/2019   Procedure: ANAL EXAM UNDER ANESTHESIA,  INCISION AND DRAINAGE, SETON PLACEMENT;  Surgeon: Romie Levee, MD;  Location: [facility];  Service: General;  Laterality: N/A;   UPPER GASTROINTESTINAL ENDOSCOPY     Social History   Social History Narrative   Single, history of substance abuse in recovery   Previously occupied on medical disability   1 child  Daughter Wellsite geologist, lives and works in [place]   Elderly parents involved and he helps them   1 brother   family history includes Allergies in his father; Breast cancer in his mother; Clotting disorder in his father; Heart disease in his father; Stomach cancer in his mother; Thyroid cancer in his father.   Review of Systems As per HPI  Objective:   Physical Exam @BP  130/88 (Cuff Size: Normal)   Pulse 94   Ht 6\' 1"  (1.854 m)   Wt 165 lb (74.8 kg)   BMI 21.77 kg/m @  General:  thin and in no acute distress Eyes:  anicteric. Lungs: Clear to auscultation bilaterally. Heart:   S1S2, no rubs, murmurs, gallops. Abdomen:  soft, non-tender, no hepatosplenomegaly, hernia, or mass and BS+.  Rectal: Indurated RP, 2 open fistulae draining some stool but no pus Small linear ulcer posterior       Skin   Some senile/steroid purpura and some scattered ecchymoses Neuro:  A&O x 3.  Psych:  appropriate mood and  Affect.   Data Reviewed: See HPI

## 2023-06-06 ENCOUNTER — Encounter: Payer: Self-pay | Admitting: Internal Medicine

## 2023-06-06 DIAGNOSIS — K610 Anal abscess: Secondary | ICD-10-CM | POA: Diagnosis not present

## 2023-06-06 DIAGNOSIS — K50919 Crohn's disease, unspecified, with unspecified complications: Secondary | ICD-10-CM | POA: Diagnosis not present

## 2023-06-07 DIAGNOSIS — R0989 Other specified symptoms and signs involving the circulatory and respiratory systems: Secondary | ICD-10-CM | POA: Diagnosis not present

## 2023-06-07 DIAGNOSIS — Z79899 Other long term (current) drug therapy: Secondary | ICD-10-CM | POA: Diagnosis not present

## 2023-06-07 DIAGNOSIS — M549 Dorsalgia, unspecified: Secondary | ICD-10-CM | POA: Diagnosis not present

## 2023-06-07 DIAGNOSIS — E7800 Pure hypercholesterolemia, unspecified: Secondary | ICD-10-CM | POA: Diagnosis not present

## 2023-06-07 DIAGNOSIS — R7301 Impaired fasting glucose: Secondary | ICD-10-CM | POA: Diagnosis not present

## 2023-06-07 DIAGNOSIS — F411 Generalized anxiety disorder: Secondary | ICD-10-CM | POA: Diagnosis not present

## 2023-06-07 DIAGNOSIS — E561 Deficiency of vitamin K: Secondary | ICD-10-CM | POA: Diagnosis not present

## 2023-06-07 DIAGNOSIS — F410 Panic disorder [episodic paroxysmal anxiety] without agoraphobia: Secondary | ICD-10-CM | POA: Diagnosis not present

## 2023-06-07 DIAGNOSIS — Z6822 Body mass index (BMI) 22.0-22.9, adult: Secondary | ICD-10-CM | POA: Diagnosis not present

## 2023-06-07 DIAGNOSIS — E559 Vitamin D deficiency, unspecified: Secondary | ICD-10-CM | POA: Diagnosis not present

## 2023-06-07 DIAGNOSIS — F322 Major depressive disorder, single episode, severe without psychotic features: Secondary | ICD-10-CM | POA: Diagnosis not present

## 2023-06-07 DIAGNOSIS — Z125 Encounter for screening for malignant neoplasm of prostate: Secondary | ICD-10-CM | POA: Diagnosis not present

## 2023-06-08 DIAGNOSIS — M9903 Segmental and somatic dysfunction of lumbar region: Secondary | ICD-10-CM | POA: Diagnosis not present

## 2023-06-17 DIAGNOSIS — F411 Generalized anxiety disorder: Secondary | ICD-10-CM | POA: Diagnosis not present

## 2023-06-17 DIAGNOSIS — F330 Major depressive disorder, recurrent, mild: Secondary | ICD-10-CM | POA: Diagnosis not present

## 2023-06-20 DIAGNOSIS — F411 Generalized anxiety disorder: Secondary | ICD-10-CM | POA: Diagnosis not present

## 2023-06-20 DIAGNOSIS — F330 Major depressive disorder, recurrent, mild: Secondary | ICD-10-CM | POA: Diagnosis not present

## 2023-06-22 ENCOUNTER — Other Ambulatory Visit: Payer: Self-pay | Admitting: Internal Medicine

## 2023-06-22 DIAGNOSIS — F330 Major depressive disorder, recurrent, mild: Secondary | ICD-10-CM | POA: Diagnosis not present

## 2023-06-22 DIAGNOSIS — F411 Generalized anxiety disorder: Secondary | ICD-10-CM | POA: Diagnosis not present

## 2023-06-22 NOTE — Telephone Encounter (Signed)
 Please advise Sir, thank you.

## 2023-06-23 DIAGNOSIS — F330 Major depressive disorder, recurrent, mild: Secondary | ICD-10-CM | POA: Diagnosis not present

## 2023-06-23 DIAGNOSIS — F411 Generalized anxiety disorder: Secondary | ICD-10-CM | POA: Diagnosis not present

## 2023-06-30 DIAGNOSIS — F411 Generalized anxiety disorder: Secondary | ICD-10-CM | POA: Diagnosis not present

## 2023-06-30 DIAGNOSIS — F330 Major depressive disorder, recurrent, mild: Secondary | ICD-10-CM | POA: Diagnosis not present

## 2023-07-02 ENCOUNTER — Other Ambulatory Visit: Payer: Self-pay

## 2023-07-02 ENCOUNTER — Inpatient Hospital Stay (HOSPITAL_COMMUNITY)
Admission: EM | Admit: 2023-07-02 | Discharge: 2023-07-05 | DRG: 371 | Disposition: A | Discharge status: Home / Self Care | Attending: Internal Medicine | Admitting: Internal Medicine

## 2023-07-02 ENCOUNTER — Encounter (HOSPITAL_COMMUNITY): Payer: Self-pay | Admitting: Internal Medicine

## 2023-07-02 ENCOUNTER — Emergency Department (HOSPITAL_COMMUNITY)

## 2023-07-02 DIAGNOSIS — K7460 Unspecified cirrhosis of liver: Secondary | ICD-10-CM | POA: Diagnosis present

## 2023-07-02 DIAGNOSIS — T50902A Poisoning by unspecified drugs, medicaments and biological substances, intentional self-harm, initial encounter: Secondary | ICD-10-CM | POA: Diagnosis present

## 2023-07-02 DIAGNOSIS — C221 Intrahepatic bile duct carcinoma: Secondary | ICD-10-CM | POA: Diagnosis present

## 2023-07-02 DIAGNOSIS — K529 Noninfective gastroenteritis and colitis, unspecified: Secondary | ICD-10-CM | POA: Diagnosis not present

## 2023-07-02 DIAGNOSIS — Z9841 Cataract extraction status, right eye: Secondary | ICD-10-CM

## 2023-07-02 DIAGNOSIS — K769 Liver disease, unspecified: Secondary | ICD-10-CM

## 2023-07-02 DIAGNOSIS — K8301 Primary sclerosing cholangitis: Secondary | ICD-10-CM | POA: Diagnosis present

## 2023-07-02 DIAGNOSIS — G894 Chronic pain syndrome: Secondary | ICD-10-CM | POA: Diagnosis present

## 2023-07-02 DIAGNOSIS — Z832 Family history of diseases of the blood and blood-forming organs and certain disorders involving the immune mechanism: Secondary | ICD-10-CM

## 2023-07-02 DIAGNOSIS — Z8659 Personal history of other mental and behavioral disorders: Secondary | ICD-10-CM

## 2023-07-02 DIAGNOSIS — E876 Hypokalemia: Secondary | ICD-10-CM | POA: Diagnosis present

## 2023-07-02 DIAGNOSIS — Z6821 Body mass index (BMI) 21.0-21.9, adult: Secondary | ICD-10-CM

## 2023-07-02 DIAGNOSIS — K603 Anal fistula, unspecified: Secondary | ICD-10-CM | POA: Diagnosis present

## 2023-07-02 DIAGNOSIS — Z7952 Long term (current) use of systemic steroids: Secondary | ICD-10-CM

## 2023-07-02 DIAGNOSIS — R55 Syncope and collapse: Secondary | ICD-10-CM | POA: Diagnosis not present

## 2023-07-02 DIAGNOSIS — R64 Cachexia: Secondary | ICD-10-CM | POA: Diagnosis present

## 2023-07-02 DIAGNOSIS — R45851 Suicidal ideations: Secondary | ICD-10-CM | POA: Diagnosis present

## 2023-07-02 DIAGNOSIS — A0472 Enterocolitis due to Clostridium difficile, not specified as recurrent: Secondary | ICD-10-CM | POA: Diagnosis present

## 2023-07-02 DIAGNOSIS — D509 Iron deficiency anemia, unspecified: Secondary | ICD-10-CM | POA: Diagnosis present

## 2023-07-02 DIAGNOSIS — Z888 Allergy status to other drugs, medicaments and biological substances status: Secondary | ICD-10-CM

## 2023-07-02 DIAGNOSIS — E860 Dehydration: Secondary | ICD-10-CM | POA: Diagnosis present

## 2023-07-02 DIAGNOSIS — Z87820 Personal history of traumatic brain injury: Secondary | ICD-10-CM

## 2023-07-02 DIAGNOSIS — R197 Diarrhea, unspecified: Secondary | ICD-10-CM | POA: Diagnosis not present

## 2023-07-02 DIAGNOSIS — Z860101 Personal history of adenomatous and serrated colon polyps: Secondary | ICD-10-CM

## 2023-07-02 DIAGNOSIS — F4310 Post-traumatic stress disorder, unspecified: Secondary | ICD-10-CM | POA: Diagnosis present

## 2023-07-02 DIAGNOSIS — Z8249 Family history of ischemic heart disease and other diseases of the circulatory system: Secondary | ICD-10-CM

## 2023-07-02 DIAGNOSIS — Z7289 Other problems related to lifestyle: Secondary | ICD-10-CM

## 2023-07-02 DIAGNOSIS — E778 Other disorders of glycoprotein metabolism: Secondary | ICD-10-CM | POA: Diagnosis present

## 2023-07-02 DIAGNOSIS — E785 Hyperlipidemia, unspecified: Secondary | ICD-10-CM | POA: Diagnosis present

## 2023-07-02 DIAGNOSIS — G4700 Insomnia, unspecified: Secondary | ICD-10-CM | POA: Diagnosis present

## 2023-07-02 DIAGNOSIS — K50813 Crohn's disease of both small and large intestine with fistula: Secondary | ICD-10-CM | POA: Diagnosis not present

## 2023-07-02 DIAGNOSIS — F331 Major depressive disorder, recurrent, moderate: Secondary | ICD-10-CM | POA: Diagnosis present

## 2023-07-02 DIAGNOSIS — K8020 Calculus of gallbladder without cholecystitis without obstruction: Secondary | ICD-10-CM | POA: Diagnosis not present

## 2023-07-02 DIAGNOSIS — I10 Essential (primary) hypertension: Secondary | ICD-10-CM | POA: Diagnosis present

## 2023-07-02 DIAGNOSIS — T380X5A Adverse effect of glucocorticoids and synthetic analogues, initial encounter: Secondary | ICD-10-CM | POA: Diagnosis present

## 2023-07-02 DIAGNOSIS — Z91018 Allergy to other foods: Secondary | ICD-10-CM

## 2023-07-02 DIAGNOSIS — K5090 Crohn's disease, unspecified, without complications: Secondary | ICD-10-CM

## 2023-07-02 DIAGNOSIS — E8809 Other disorders of plasma-protein metabolism, not elsewhere classified: Secondary | ICD-10-CM | POA: Diagnosis present

## 2023-07-02 DIAGNOSIS — Z803 Family history of malignant neoplasm of breast: Secondary | ICD-10-CM

## 2023-07-02 DIAGNOSIS — Z818 Family history of other mental and behavioral disorders: Secondary | ICD-10-CM

## 2023-07-02 DIAGNOSIS — Z79899 Other long term (current) drug therapy: Secondary | ICD-10-CM

## 2023-07-02 DIAGNOSIS — F411 Generalized anxiety disorder: Secondary | ICD-10-CM | POA: Diagnosis present

## 2023-07-02 DIAGNOSIS — R531 Weakness: Secondary | ICD-10-CM | POA: Diagnosis not present

## 2023-07-02 DIAGNOSIS — Z5329 Procedure and treatment not carried out because of patient's decision for other reasons: Secondary | ICD-10-CM | POA: Diagnosis not present

## 2023-07-02 DIAGNOSIS — K754 Autoimmune hepatitis: Secondary | ICD-10-CM | POA: Diagnosis present

## 2023-07-02 DIAGNOSIS — K50814 Crohn's disease of both small and large intestine with abscess: Secondary | ICD-10-CM | POA: Diagnosis not present

## 2023-07-02 DIAGNOSIS — R7401 Elevation of levels of liver transaminase levels: Secondary | ICD-10-CM | POA: Diagnosis present

## 2023-07-02 DIAGNOSIS — D696 Thrombocytopenia, unspecified: Secondary | ICD-10-CM | POA: Diagnosis present

## 2023-07-02 DIAGNOSIS — Z9842 Cataract extraction status, left eye: Secondary | ICD-10-CM

## 2023-07-02 DIAGNOSIS — D72829 Elevated white blood cell count, unspecified: Secondary | ICD-10-CM | POA: Diagnosis not present

## 2023-07-02 DIAGNOSIS — W19XXXA Unspecified fall, initial encounter: Secondary | ICD-10-CM | POA: Diagnosis present

## 2023-07-02 DIAGNOSIS — N179 Acute kidney failure, unspecified: Secondary | ICD-10-CM | POA: Diagnosis present

## 2023-07-02 DIAGNOSIS — Z880 Allergy status to penicillin: Secondary | ICD-10-CM

## 2023-07-02 DIAGNOSIS — Z800 Family history of malignant neoplasm of digestive organs: Secondary | ICD-10-CM

## 2023-07-02 DIAGNOSIS — R251 Tremor, unspecified: Secondary | ICD-10-CM | POA: Diagnosis present

## 2023-07-02 DIAGNOSIS — R9431 Abnormal electrocardiogram [ECG] [EKG]: Secondary | ICD-10-CM | POA: Diagnosis not present

## 2023-07-02 DIAGNOSIS — R932 Abnormal findings on diagnostic imaging of liver and biliary tract: Secondary | ICD-10-CM

## 2023-07-02 DIAGNOSIS — Z87891 Personal history of nicotine dependence: Secondary | ICD-10-CM

## 2023-07-02 DIAGNOSIS — Z9152 Personal history of nonsuicidal self-harm: Secondary | ICD-10-CM

## 2023-07-02 DIAGNOSIS — F1011 Alcohol abuse, in remission: Secondary | ICD-10-CM | POA: Diagnosis not present

## 2023-07-02 DIAGNOSIS — Z560 Unemployment, unspecified: Secondary | ICD-10-CM

## 2023-07-02 DIAGNOSIS — Z961 Presence of intraocular lens: Secondary | ICD-10-CM | POA: Diagnosis present

## 2023-07-02 DIAGNOSIS — E43 Unspecified severe protein-calorie malnutrition: Secondary | ICD-10-CM | POA: Diagnosis present

## 2023-07-02 DIAGNOSIS — I959 Hypotension, unspecified: Secondary | ICD-10-CM | POA: Diagnosis not present

## 2023-07-02 DIAGNOSIS — Z85828 Personal history of other malignant neoplasm of skin: Secondary | ICD-10-CM

## 2023-07-02 DIAGNOSIS — Z808 Family history of malignant neoplasm of other organs or systems: Secondary | ICD-10-CM

## 2023-07-02 DIAGNOSIS — Z9151 Personal history of suicidal behavior: Secondary | ICD-10-CM

## 2023-07-02 DIAGNOSIS — F329 Major depressive disorder, single episode, unspecified: Secondary | ICD-10-CM | POA: Diagnosis present

## 2023-07-02 DIAGNOSIS — K7290 Hepatic failure, unspecified without coma: Secondary | ICD-10-CM | POA: Diagnosis not present

## 2023-07-02 DIAGNOSIS — F410 Panic disorder [episodic paroxysmal anxiety] without agoraphobia: Secondary | ICD-10-CM | POA: Diagnosis present

## 2023-07-02 DIAGNOSIS — K5730 Diverticulosis of large intestine without perforation or abscess without bleeding: Secondary | ICD-10-CM | POA: Diagnosis not present

## 2023-07-02 DIAGNOSIS — Z91048 Other nonmedicinal substance allergy status: Secondary | ICD-10-CM

## 2023-07-02 DIAGNOSIS — R21 Rash and other nonspecific skin eruption: Secondary | ICD-10-CM | POA: Diagnosis not present

## 2023-07-02 LAB — URINALYSIS, W/ REFLEX TO CULTURE (INFECTION SUSPECTED)
Bacteria, UA: NONE SEEN
Bilirubin Urine: NEGATIVE
Glucose, UA: NEGATIVE mg/dL
Ketones, ur: NEGATIVE mg/dL
Leukocytes,Ua: NEGATIVE
Nitrite: NEGATIVE
Protein, ur: NEGATIVE mg/dL
Specific Gravity, Urine: 1.009 (ref 1.005–1.030)
pH: 5 (ref 5.0–8.0)

## 2023-07-02 LAB — CBC WITH DIFFERENTIAL/PLATELET
Abs Immature Granulocytes: 0.51 10*3/uL — ABNORMAL HIGH (ref 0.00–0.07)
Basophils Absolute: 0.1 10*3/uL (ref 0.0–0.1)
Basophils Relative: 0 %
Eosinophils Absolute: 0 10*3/uL (ref 0.0–0.5)
Eosinophils Relative: 0 %
HCT: 45.3 % (ref 39.0–52.0)
Hemoglobin: 15.1 g/dL (ref 13.0–17.0)
Immature Granulocytes: 2 %
Lymphocytes Relative: 4 %
Lymphs Abs: 1.1 10*3/uL (ref 0.7–4.0)
MCH: 32 pg (ref 26.0–34.0)
MCHC: 33.3 g/dL (ref 30.0–36.0)
MCV: 96 fL (ref 80.0–100.0)
Monocytes Absolute: 1.8 10*3/uL — ABNORMAL HIGH (ref 0.1–1.0)
Monocytes Relative: 7 %
Neutro Abs: 23.8 10*3/uL — ABNORMAL HIGH (ref 1.7–7.7)
Neutrophils Relative %: 87 %
Platelets: 120 10*3/uL — ABNORMAL LOW (ref 150–400)
RBC: 4.72 MIL/uL (ref 4.22–5.81)
RDW: 13.6 % (ref 11.5–15.5)
WBC: 27.4 10*3/uL — ABNORMAL HIGH (ref 4.0–10.5)
nRBC: 0 % (ref 0.0–0.2)

## 2023-07-02 LAB — CLOSTRIDIUM DIFFICILE BY PCR, REFLEXED: Toxigenic C. Difficile by PCR: POSITIVE — AB

## 2023-07-02 LAB — C DIFFICILE QUICK SCREEN W PCR REFLEX
C Diff antigen: POSITIVE — AB
C Diff toxin: NEGATIVE

## 2023-07-02 LAB — COMPREHENSIVE METABOLIC PANEL WITH GFR
ALT: 88 U/L — ABNORMAL HIGH (ref 0–44)
AST: 37 U/L (ref 15–41)
Albumin: 2.4 g/dL — ABNORMAL LOW (ref 3.5–5.0)
Alkaline Phosphatase: 140 U/L — ABNORMAL HIGH (ref 38–126)
Anion gap: 10 (ref 5–15)
BUN: 25 mg/dL — ABNORMAL HIGH (ref 6–20)
CO2: 24 mmol/L (ref 22–32)
Calcium: 7.5 mg/dL — ABNORMAL LOW (ref 8.9–10.3)
Chloride: 102 mmol/L (ref 98–111)
Creatinine, Ser: 1.41 mg/dL — ABNORMAL HIGH (ref 0.61–1.24)
GFR, Estimated: 57 mL/min — ABNORMAL LOW (ref >60)
Glucose, Bld: 82 mg/dL (ref 70–99)
Potassium: 3 mmol/L — ABNORMAL LOW (ref 3.5–5.1)
Sodium: 136 mmol/L (ref 135–145)
Total Bilirubin: 0.9 mg/dL (ref 0.0–1.2)
Total Protein: 5 g/dL — ABNORMAL LOW (ref 6.5–8.1)

## 2023-07-02 MED ORDER — ONDANSETRON HCL 4 MG PO TABS
4.0000 mg | ORAL_TABLET | Freq: Four times a day (QID) | ORAL | Status: DC | PRN
  Administered 2023-07-02: 4 mg via ORAL
  Filled 2023-07-02: qty 1

## 2023-07-02 MED ORDER — VENLAFAXINE HCL ER 150 MG PO CP24
150.0000 mg | ORAL_CAPSULE | Freq: Every day | ORAL | Status: DC
  Administered 2023-07-03 – 2023-07-05 (×3): 150 mg via ORAL
  Filled 2023-07-02 (×3): qty 1

## 2023-07-02 MED ORDER — PREDNISONE 20 MG PO TABS
30.0000 mg | ORAL_TABLET | Freq: Every day | ORAL | Status: DC
  Administered 2023-07-03 – 2023-07-05 (×3): 30 mg via ORAL
  Filled 2023-07-02 (×3): qty 1

## 2023-07-02 MED ORDER — ACETAMINOPHEN 325 MG PO TABS: 650.0000 mg | ORAL_TABLET | Freq: Four times a day (QID) | ORAL | Status: DC | PRN

## 2023-07-02 MED ORDER — ENSURE MAX PROTEIN PO LIQD
237.0000 mL | Freq: Two times a day (BID) | ORAL | Status: DC
  Administered 2023-07-03 – 2023-07-05 (×5): 237 mL via ORAL
  Filled 2023-07-02 (×7): qty 330

## 2023-07-02 MED ORDER — SODIUM CHLORIDE 0.9 % IV SOLN
2.0000 g | INTRAVENOUS | Status: DC
  Administered 2023-07-02: 2 g via INTRAVENOUS
  Filled 2023-07-02: qty 20

## 2023-07-02 MED ORDER — HYDROXYZINE HCL 25 MG PO TABS
25.0000 mg | ORAL_TABLET | Freq: Three times a day (TID) | ORAL | Status: DC | PRN
  Administered 2023-07-02 – 2023-07-05 (×6): 25 mg via ORAL
  Filled 2023-07-02 (×6): qty 1

## 2023-07-02 MED ORDER — ENOXAPARIN SODIUM 40 MG/0.4ML IJ SOSY
40.0000 mg | PREFILLED_SYRINGE | INTRAMUSCULAR | Status: DC
  Administered 2023-07-02 – 2023-07-04 (×3): 40 mg via SUBCUTANEOUS
  Filled 2023-07-02 (×3): qty 0.4

## 2023-07-02 MED ORDER — POTASSIUM CHLORIDE CRYS ER 20 MEQ PO TBCR
20.0000 meq | EXTENDED_RELEASE_TABLET | Freq: Once | ORAL | Status: AC
  Administered 2023-07-02: 20 meq via ORAL
  Filled 2023-07-02: qty 1

## 2023-07-02 MED ORDER — VENLAFAXINE HCL ER 75 MG PO CP24
75.0000 mg | ORAL_CAPSULE | Freq: Every day | ORAL | Status: DC
  Administered 2023-07-03 – 2023-07-05 (×3): 75 mg via ORAL
  Filled 2023-07-02 (×3): qty 1

## 2023-07-02 MED ORDER — MIRTAZAPINE 15 MG PO TABS
30.0000 mg | ORAL_TABLET | Freq: Every day | ORAL | Status: DC
  Administered 2023-07-02: 30 mg via ORAL
  Filled 2023-07-02: qty 2

## 2023-07-02 MED ORDER — SODIUM CHLORIDE 0.9 % IV SOLN: INTRAVENOUS | Status: DC

## 2023-07-02 MED ORDER — SODIUM CHLORIDE 0.9 % IV BOLUS
2000.0000 mL | Freq: Once | INTRAVENOUS | Status: AC
  Administered 2023-07-02: 2000 mL via INTRAVENOUS

## 2023-07-02 MED ORDER — POTASSIUM CHLORIDE CRYS ER 20 MEQ PO TBCR
60.0000 meq | EXTENDED_RELEASE_TABLET | Freq: Once | ORAL | Status: AC
  Administered 2023-07-02: 60 meq via ORAL
  Filled 2023-07-02: qty 3

## 2023-07-02 MED ORDER — ONDANSETRON HCL 4 MG/2ML IJ SOLN
4.0000 mg | Freq: Four times a day (QID) | INTRAMUSCULAR | Status: DC | PRN
Start: 2023-07-02 — End: 2023-07-05

## 2023-07-02 MED ORDER — METRONIDAZOLE 500 MG/100ML IV SOLN
500.0000 mg | Freq: Two times a day (BID) | INTRAVENOUS | Status: DC
  Administered 2023-07-02 – 2023-07-03 (×2): 500 mg via INTRAVENOUS
  Filled 2023-07-02 (×2): qty 100

## 2023-07-02 MED ORDER — ACETAMINOPHEN 650 MG RE SUPP: 650.0000 mg | Freq: Four times a day (QID) | RECTAL | Status: DC | PRN

## 2023-07-02 NOTE — H&P (Signed)
 History and Physical    Patient: [name] ZOX:[id number] DOB: Apr 26, 1965 DOA: 07/02/2023 DOS: the patient was seen and examined on 07/02/2023 PCP: Jimmey Mould, MD  Patient coming from: Home  Chief Complaint: No chief complaint on file.  HPI: [name] is a 58 y.o. male with medical history significant of send allergies, anal fistula, anxiety, osteoarthritis, autoimmune hepatitis, avoidant restrictive food intake disorder, cancer of skin of the neck, basal cell carcinoma of the left lower extremity, cataracts, chronic mesenteric ischemia, chronic pain syndrome Crohn's disease, daily protein tolerance, functional diarrhea, history of C. difficile, Grovers disease, history of alcohol abuse history of multiple concussions, history of substance abuse in remission since 1997, history of depression, history of suicide attempt, osteopenia, portal vein thrombosis, primary sclerosing cholangitis, seasonal allergies, steroid-induced diabetes, vitamin A  deficiency, vitamin B6 deficiency vitamin C deficiency who presented to the emergency department with complaints of having multiple episodes of diarrhea since yesterday resulting in dizziness with a fall today at home and near syncope.  He has also had a nonpruritic rash on his face for the past week.    No abdominal pain, nausea, emesis, constipation, melena or hematocheziaPositive chills, he denied fever,  sore throat, wheezing or hemoptysis.  He has had rhinorrhea.  No chest pain, palpitations, diaphoresis, PND, orthopnea or pitting edema of the lower extremities..  No flank pain, dysuria, frequency or hematuria.  No polyuria, polydipsia, polyphagia or blurred vision.   Lab work: CMP showed a potassium of 3.0 mmol/L, the rest of the electrolytes are normal after calcium correction.  Glucose 82, BUN 25 and creatinine 1.41 mg/dL.  Total protein 5.0 and albumin 2.4 g/dL.  AST 37, ALT 88 and alkaline phosphatase 140.  Total bilirubin was  normal.  Imaging: CT abdomen/pelvis with contrast showed mild ill-definition of the wall of the descending colon with subtle pericolonic edema in the same region suggesting colitis.  There is a new 3.7 x 2.8 cm noncystic lesion in the dome of the liver not present on prior studies.  Given that history of primary sclerosing cholangitis, close follow-up and evaluation for with MRI with and without contrast recommended to exclude cholangiocarcinoma.  Cholelithiasis.  Aortic atherosclerosis.  ED course: Initial vital signs were temperature 98.5 F, pulse 85, respirations 16, BP 132/77 mmHg O2 sat 99% on room air.  The patient received 2000 mL of normal saline bolus and KCl 60 mill equivalents p.o. x 1.   Review of Systems: As mentioned in the history of present illness. All other systems reviewed and are negative. 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 06/15/2018   Cancer of skin of neck    Cataract    Chronic mesenteric ischemia ([name])    Chronic pain syndrome 07/09/2016   Crohn's disease of small and large intestines ([name])    followed by dr Gorman Laughter gessner   Dairy product intolerance    Diarrhea, functional    Family history of adverse reaction to anesthesia    Grover's disease    transient acantholytic dermatosis   History of alcohol abuse    History of basal cell carcinoma excision    2013 left leg   History of Clostridium difficile    10/  2014   History of multiple concussions    x6   last one Jan 2017 per pt--  no residual   History of substance abuse ([name])    quit 1997 per pt   History of suicide attempt    05-18-2012  overdose /  failure to thrive   Iron  deficiency anemia due to chronic blood loss    Major depression, recurrent, chronic ([name])     Osteopenia    Personal history of adenomatous colonic polyps 12/2010, 03/2012   12/2010 - 8 mm serrated adenoma of rectum   Portal vein thrombosis 03/21/2015   right   Primary sclerosing cholangitis    ? 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   Substance abuse ([name]) 1997   Alcohol   Vitamin A  deficiency 05/23/2018   Vitamin B6 deficiency 07/27/2018   Vitamin C deficiency 05/23/2018   Past Surgical History:  Procedure Laterality Date   ABDOMINAL AORTAGRAM N/A 03/29/2012   Procedure: ABDOMINAL Tommi Fraise;  Surgeon: Margherita Shell, MD;  Location: [facility] CATH LAB;  Service: Cardiovascular;  Laterality: N/A;   ADENOIDECTOMY  age 67   BIOPSY  05/31/2018   Procedure: BIOPSY;  Surgeon: Janel Medford, MD;  Location: [facility] ENDOSCOPY;  Service: Endoscopy;;   CATARACT EXTRACTION W/ INTRAOCULAR LENS  IMPLANT, BILATERAL  2009   COLONOSCOPY  2001, 05/02/2003, 01/28/11   2012: Right colon Crohn's, rectal polyp   COLONOSCOPY  03/31/2012   Procedure: COLONOSCOPY;  Surgeon: Nannette Babe, MD;  Location: [facility] ENDOSCOPY;  Service: Gastroenterology;  Laterality: N/A;   COLONOSCOPY WITH PROPOFOL  N/A 05/31/2018   Procedure: COLONOSCOPY WITH PROPOFOL ;  Surgeon: Janel Medford, MD;  Location: [facility] ENDOSCOPY;  Service: Endoscopy;  Laterality: N/A;   ESOPHAGOGASTRODUODENOSCOPY  01/28/2011   Normal   FOOT SURGERY Right age 56   MOHS SURGERY Left 11/2013   left ankle parakerotosis    PERCUTANEOUS LIVER BIOPSY  2007 and 2008   PILONIDAL CYST EXCISION  age 45   PLACEMENT OF SETON N/A 11/06/2015   Procedure: PLACEMENT OF SETON;  Surgeon: Joyce Nixon, MD;  Location: [facility];  Service: General;  Laterality: N/A;   PLACEMENT OF SETON  07/2018   at [facility]   RECTAL EXAM UNDER ANESTHESIA N/A 01/18/2019   Procedure: ANAL EXAM UNDER ANESTHESIA,  INCISION AND DRAINAGE, SETON PLACEMENT;  Surgeon: Joyce Nixon, MD;  Location: [facility]  SUR[facility]ERY [facility];  Service: General;  Laterality: N/A;   UPPER GASTROINTESTINAL ENDOSCOPY     Social History:  reports that he quit smoking about 26 years ago. His smoking use included cigarettes. He started smoking about 41 years ago. He has a 15 pack-year smoking history. He has never used smokeless tobacco. He reports that he does not currently use drugs after having used the following drugs: Cocaine and Marijuana. He reports that he does not drink alcohol.  Allergies  Allergen Reactions   Asacol  [Mesalamine ] Diarrhea    abd pain   Azathioprine Diarrhea    abd pain   Gluten Meal Diarrhea   Metoprolol  Tartrate Nausea Only    Stomach upset   Mycophenolate Mofetil Diarrhea    abd pain   Other Other (See Comments)    NUTS; constipation   Wheat Diarrhea    Constipation; flatulence; abd pain   Abilify  [Aripiprazole ] Diarrhea   Amoxicillin  Other (See Comments)    Lots of gas   Tape Itching and Rash  Please use "paper" tape    Family History  Problem Relation Age of Onset   Heart disease Father    Thyroid  cancer Father    Allergies Father    Clotting disorder Father    Breast cancer Mother    Stomach cancer Mother    Colon cancer Neg Hx    Esophageal cancer Neg Hx    Rectal cancer Neg Hx     Prior to Admission medications   Medication Sig Start Date End Date Taking? Authorizing Provider  doxycycline  (VIBRA -TABS) 100 MG tablet Take 1 tablet (100 mg total) by mouth every 12 (twelve) hours. 05/19/23   Silas Drivers, MD  hydrOXYzine  (ATARAX ) 25 MG tablet Take 1 tablet (25 mg total) by mouth 3 (three) times daily as needed for anxiety. 05/19/23   Silas Drivers, MD  LORazepam  (ATIVAN ) 1 MG tablet Take 1 tablet (1 mg total) by mouth every 4 (four) hours as needed (tremors, psychomotor agitation). 05/19/23   Parmar, Meenakshi, MD  Na Sulfate-K Sulfate-Mg Sulfate concentrate (SUPREP) 17.5-3.13-1.6 GM/177ML SOLN Take 1 kit (354 mLs total) by mouth as directed. 05/30/23   Kenney Peacemaker, MD  neomycin -bacitracin -polymyxin [phone] OINT Apply 1 Application topically every 6 (six) hours as needed. 05/19/23   Silas Drivers, MD  Nystatin  (GERHARDT'S BUTT CREAM) CREA Apply to perianal skin bid-tid Disp # 1 jar 05/30/23   Kenney Peacemaker, MD  predniSONE  (DELTASONE ) 20 MG tablet TAKE 1.5 TABLETS (30 MG TOTAL) BY MOUTH DAILY WITH BREAKFAST. 06/22/23   Kenney Peacemaker, MD  venlafaxine  XR (EFFEXOR -XR) 150 MG 24 hr capsule Take 1 capsule (150 mg total) by mouth daily with breakfast. 05/20/23   Silas Drivers, MD    Physical Exam: Vitals:   07/02/23 1031 07/02/23 1453  BP: 132/77 131/61  Pulse: 85 74  Resp: 16 16  Temp: 98.5 F (36.9 C)   TempSrc: Oral   SpO2: 99% 95%  Weight: 74.8 kg   Height: 6\' 1"  (1.854 m)    Physical Exam Vitals and nursing note reviewed.  Constitutional:      General: He is awake. He is not in acute distress.    Appearance: Normal appearance. He is normal weight. He is ill-appearing.  HENT:     Head: Normocephalic.     Nose: No rhinorrhea.     Mouth/Throat:     Mouth: Mucous membranes are dry.  Eyes:     General: No scleral icterus.    Pupils: Pupils are equal, round, and reactive to light.  Neck:     Vascular: No JVD.  Cardiovascular:     Rate and Rhythm: Normal rate and regular rhythm.     Heart sounds: S1 normal and S2 normal.  Pulmonary:     Effort: Pulmonary effort is normal.     Breath sounds: Normal breath sounds. No wheezing, rhonchi or rales.  Abdominal:     General: Bowel sounds are normal. There is no distension.     Palpations: Abdomen is soft.     Tenderness: There is no abdominal tenderness. There is no right CVA tenderness, left CVA tenderness or guarding.  Musculoskeletal:     Cervical back: Neck supple.     Right lower leg: No edema.     Left lower leg: No edema.  Skin:    General: Skin is warm and dry.  Neurological:     General: No focal deficit present.     Mental Status: He is alert and oriented to  person, place, and  time.  Psychiatric:        Mood and Affect: Mood normal.        Behavior: Behavior normal. Behavior is cooperative.     Data Reviewed:  Results are pending, will review when available. EKG: Vent. rate 79 BPM PR interval 155 ms QRS duration 84 ms QT/QTcB 386/443 ms P-R-T axes 40 28 38 Sinus rhythm  Assessment and Plan: Principal Problem:   Exacerbation of Crohn's disease ([name]) Associated with: Diarrhea/Transaminitis Resulting in:   Dehydration And:   AKI (acute kidney injury) ([name]) Observation/telemetry. Continue IV fluids. Clear liquid diet. Analgesics as needed. Antiemetics as needed. Avoid hypotension. Avoid nephrotoxins. Monitor intake and output. Monitor renal function/electrolytes. Check gastrointestinal panel by PCR. Check C. difficile toxin. Has significant Leukocytosis Blood cultures x 2. Ceftriaxone 2 g IVPB every 4 hours. Metronidazole  500 mg IVPB every 24 hours. Gastroenterology will see in the morning.  Active Problems:   Hypokalemia Replacing.    Hyperlipidemia Currently not on statin medication.    Essential hypertension Monitor blood pressure. Antihypertensives as needed.    Protein-calorie malnutrition, severe ([name]) In the setting of acute superimposed on chronic illness. May benefit from protein supplementation. Consider nutritional services evaluation. Follow-up albumin level.    Thrombocytopenia ([name]) Monitor platelet count.    PTSD (post-traumatic stress disorder)   MDD (major depressive disorder)   Suicidal ideation  Continue mirtazapine  30 mg p.o. bedtime. Continue hydroxyzine  25 mg 3 times a day as needed. Continue viloxazine XR 225 mg p.o. daily.    Tremor Monitor clinically.    Advance Care Planning:   Code Status: Full Code   Consults: [facility] Darol Elizabeth, MD.)  Family Communication:   Severity of Illness: The appropriate patient status for this patient is INPATIENT. Inpatient  status is judged to be reasonable and necessary in order to provide the required intensity of service to ensure the patient's safety. The patient's presenting symptoms, physical exam findings, and initial radiographic and laboratory data in the context of their chronic comorbidities is felt to place them at high risk for further clinical deterioration. Furthermore, it is not anticipated that the patient will be medically stable for discharge from the hospital within 2 midnights of admission.   * I certify that at the point of admission it is my clinical judgment that the patient will require inpatient hospital care spanning beyond 2 midnights from the point of admission due to high intensity of service, high risk for further deterioration and high frequency of surveillance required.*  Author: Danice Dural, MD 07/02/2023 3:23 PM  For on call review [email or web address].   This document was prepared using Dragon voice recognition software and may contain some unintended transcription errors.

## 2023-07-02 NOTE — ED Triage Notes (Signed)
 Per EMS from home. Fall today, left knee. Diarrhea and weakness since yesterday. Rash on face for past week.  BP 114/82 HR 84 99 RA CBG 81

## 2023-07-02 NOTE — Progress Notes (Signed)
 Received pt from ED. Upon suicidal screening assessment, pt answered "Yes" to question 1-7 except question 5 "No" placing patient a high risk for suicide based upon the screening tool. Dr Bonita Bussing made aware and obtained order for 1:1 sitter and psych consult. Pt verbalized that he tried to kill himself last month but was unsuccessful. Pt asked if he is currently having those thought or if he has a plan at the moment and he said no. Dr Bonita Bussing made aware. Awaiting psych to see pt.

## 2023-07-02 NOTE — Plan of Care (Signed)

## 2023-07-02 NOTE — ED Provider Notes (Signed)
 [place] EMERGENCY DEPARTMENT AT [facility] Provider Note   CSN: [id number] Arrival date & time: 07/02/23  1024     History  No chief complaint on file.   [name] is a 58 y.o. male.  58 year old male with history of Crohn's disease presents with diarrhea as well as rash.  Patient states that he has been having watery diarrhea for 24 hours.  Was in the bathroom today and went to stand up and felt really weak and almost passed out.  Denies any headache or chest pain with this.  States that he has not had any blood in his stools.  Some abdominal cramping but no real pain.  Recent antibiotics or travel history.  Diarrhea has been watery.  He has used Lomotil  with limited relief.  States the rash is not pruritic.  It is on his face and arms.  Denies any oral involvement       Home Medications Prior to Admission medications   Medication Sig Start Date End Date Taking? Authorizing Provider  doxycycline  (VIBRA -TABS) 100 MG tablet Take 1 tablet (100 mg total) by mouth every 12 (twelve) hours. 05/19/23   Silas Drivers, MD  hydrOXYzine  (ATARAX ) 25 MG tablet Take 1 tablet (25 mg total) by mouth 3 (three) times daily as needed for anxiety. 05/19/23   Silas Drivers, MD  LORazepam  (ATIVAN ) 1 MG tablet Take 1 tablet (1 mg total) by mouth every 4 (four) hours as needed (tremors, psychomotor agitation). 05/19/23   Parmar, Meenakshi, MD  Na Sulfate-K Sulfate-Mg Sulfate concentrate (SUPREP) 17.5-3.13-1.6 GM/177ML SOLN Take 1 kit (354 mLs total) by mouth as directed. 05/30/23   Kenney Peacemaker, MD  neomycin -bacitracin -polymyxin [phone] OINT Apply 1 Application topically every 6 (six) hours as needed. 05/19/23   Silas Drivers, MD  Nystatin  (GERHARDT'S BUTT CREAM) CREA Apply to perianal skin bid-tid Disp # 1 jar 05/30/23   Kenney Peacemaker, MD  predniSONE  (DELTASONE ) 20 MG tablet TAKE 1.5 TABLETS (30 MG TOTAL) BY MOUTH DAILY WITH BREAKFAST. 06/22/23   Kenney Peacemaker, MD   venlafaxine  XR (EFFEXOR -XR) 150 MG 24 hr capsule Take 1 capsule (150 mg total) by mouth daily with breakfast. 05/20/23   Silas Drivers, MD      Allergies    Asacol  [mesalamine ], Azathioprine, Gluten meal, Metoprolol  tartrate, Mycophenolate mofetil, Other, Wheat, Abilify  [aripiprazole ], Amoxicillin , and Tape    Review of Systems   Review of Systems  All other systems reviewed and are negative.   Physical Exam Updated Vital Signs BP 132/77 (BP Location: Right Arm)   Pulse 85   Temp 98.5 F (36.9 C) (Oral)   Resp 16   Ht 1.854 m (6\' 1" )   Wt 74.8 kg   SpO2 99%   BMI 21.77 kg/m  Physical Exam Vitals and nursing note reviewed.  Constitutional:      General: He is not in acute distress.    Appearance: He is cachectic. He is not toxic-appearing.  HENT:     Head: Normocephalic and atraumatic.  Eyes:     General: Lids are normal.     Conjunctiva/sclera: Conjunctivae normal.     Pupils: Pupils are equal, round, and reactive to light.  Neck:     Thyroid : No thyroid  mass.     Trachea: No tracheal deviation.  Cardiovascular:     Rate and Rhythm: Normal rate and regular rhythm.     Heart sounds: Normal heart sounds. No murmur heard.    No gallop.  Pulmonary:  Effort: Pulmonary effort is normal. No respiratory distress.     Breath sounds: Normal breath sounds. No stridor. No decreased breath sounds, wheezing, rhonchi or rales.  Abdominal:     General: There is no distension.     Palpations: Abdomen is soft.     Tenderness: There is no abdominal tenderness. There is no rebound.  Musculoskeletal:        General: No tenderness. Normal range of motion.     Cervical back: Normal range of motion and neck supple.  Skin:    General: Skin is warm and dry.     Findings: No abrasion or rash.  Neurological:     Mental Status: He is alert and oriented to person, place, and time. Mental status is at baseline.     GCS: GCS eye subscore is 4. GCS verbal subscore is 5. GCS motor  subscore is 6.     Cranial Nerves: No cranial nerve deficit.     Sensory: No sensory deficit.     Motor: Motor function is intact.  Psychiatric:        Attention and Perception: Attention normal.        Speech: Speech normal.        Behavior: Behavior normal.     ED Results / Procedures / Treatments   Labs (all labs ordered are listed, but only abnormal results are displayed) Labs Reviewed  CBC WITH DIFFERENTIAL/PLATELET  COMPREHENSIVE METABOLIC PANEL WITH GFR    EKG EKG Interpretation Date/Time:  Saturday Jul 02 2023 11:09:06 EDT Ventricular Rate:  79 PR Interval:  155 QRS Duration:  84 QT Interval:  386 QTC Calculation: 443 R Axis:   28  Text Interpretation: Sinus rhythm No significant change since last tracing Confirmed by Lind Repine ([id number]) on 07/02/2023 12:14:55 PM  Radiology No results found.  Procedures Procedures    Medications Ordered in ED Medications  0.9 %  sodium chloride  infusion (has no administration in time range)  sodium chloride  0.9 % bolus 2,000 mL (has no administration in time range)    ED Course/ Medical Decision Making/ A&P                                 Medical Decision Making Amount and/or Complexity of Data Reviewed Labs: ordered. Radiology: ordered. ECG/medicine tests: ordered.  Risk Prescription drug management.   Patient is EKG shows normal sinus.  Patient was significant white count of 27,000.  CT scan of abdomen shows colitis.  Patient endorses severe weakness.  Labs consistent with dehydration as creatinine shows a mild AKI.  Given IV fluids and still feels the same.  Suspect that patient is having a Crohn's exacerbation.  Patient also with hypokalemia and given potassium for this.  Will admit to the hospitalist team        Final Clinical Impression(s) / ED Diagnoses Final diagnoses:  None    Rx / DC Orders ED Discharge Orders     None         Lind Repine, MD 07/02/23 1517

## 2023-07-03 ENCOUNTER — Inpatient Hospital Stay (HOSPITAL_COMMUNITY)

## 2023-07-03 DIAGNOSIS — R55 Syncope and collapse: Secondary | ICD-10-CM | POA: Diagnosis not present

## 2023-07-03 DIAGNOSIS — K50813 Crohn's disease of both small and large intestine with fistula: Secondary | ICD-10-CM

## 2023-07-03 DIAGNOSIS — R932 Abnormal findings on diagnostic imaging of liver and biliary tract: Secondary | ICD-10-CM

## 2023-07-03 DIAGNOSIS — A0472 Enterocolitis due to Clostridium difficile, not specified as recurrent: Secondary | ICD-10-CM | POA: Diagnosis not present

## 2023-07-03 DIAGNOSIS — K7460 Unspecified cirrhosis of liver: Secondary | ICD-10-CM

## 2023-07-03 DIAGNOSIS — K5090 Crohn's disease, unspecified, without complications: Secondary | ICD-10-CM | POA: Diagnosis not present

## 2023-07-03 DIAGNOSIS — F331 Major depressive disorder, recurrent, moderate: Secondary | ICD-10-CM | POA: Diagnosis present

## 2023-07-03 DIAGNOSIS — K8301 Primary sclerosing cholangitis: Secondary | ICD-10-CM

## 2023-07-03 DIAGNOSIS — K769 Liver disease, unspecified: Secondary | ICD-10-CM | POA: Diagnosis not present

## 2023-07-03 LAB — COMPREHENSIVE METABOLIC PANEL WITH GFR
ALT: 66 U/L — ABNORMAL HIGH (ref 0–44)
AST: 28 U/L (ref 15–41)
Albumin: 2.1 g/dL — ABNORMAL LOW (ref 3.5–5.0)
Alkaline Phosphatase: 113 U/L (ref 38–126)
Anion gap: 6 (ref 5–15)
BUN: 16 mg/dL (ref 6–20)
CO2: 22 mmol/L (ref 22–32)
Calcium: 7.3 mg/dL — ABNORMAL LOW (ref 8.9–10.3)
Chloride: 116 mmol/L — ABNORMAL HIGH (ref 98–111)
Creatinine, Ser: 1.02 mg/dL (ref 0.61–1.24)
GFR, Estimated: >60 mL/min (ref >60)
Glucose, Bld: 64 mg/dL — ABNORMAL LOW (ref 70–99)
Potassium: 3.5 mmol/L (ref 3.5–5.1)
Sodium: 144 mmol/L (ref 135–145)
Total Bilirubin: 0.6 mg/dL (ref 0.0–1.2)
Total Protein: 4.5 g/dL — ABNORMAL LOW (ref 6.5–8.1)

## 2023-07-03 LAB — HIV ANTIBODY (ROUTINE TESTING W REFLEX): HIV Screen 4th Generation wRfx: NONREACTIVE

## 2023-07-03 LAB — GASTROINTESTINAL PANEL BY PCR, STOOL (REPLACES STOOL CULTURE)

## 2023-07-03 LAB — CBC
HCT: 41.7 % (ref 39.0–52.0)
Hemoglobin: 13.7 g/dL (ref 13.0–17.0)
MCH: 32.1 pg (ref 26.0–34.0)
MCHC: 32.9 g/dL (ref 30.0–36.0)
MCV: 97.7 fL (ref 80.0–100.0)
Platelets: 112 10*3/uL — ABNORMAL LOW (ref 150–400)
RBC: 4.27 MIL/uL (ref 4.22–5.81)
RDW: 13.7 % (ref 11.5–15.5)
WBC: 20 10*3/uL — ABNORMAL HIGH (ref 4.0–10.5)
nRBC: 0 % (ref 0.0–0.2)

## 2023-07-03 LAB — ECHOCARDIOGRAM COMPLETE
AR max vel: 2.25 cm2
AV Area VTI: 2.49 cm2
AV Area mean vel: 2.41 cm2
AV Mean grad: 8.1 mmHg
AV Peak grad: 18.2 mmHg
Ao pk vel: 2.13 m/s
Area-P 1/2: 3.65 cm2
Calc EF: 65.1 %
Height: 73 in
S' Lateral: 3 cm
Single Plane A2C EF: 64.7 %
Single Plane A4C EF: 66 %
Weight: 2620.83 [oz_av]

## 2023-07-03 LAB — MAGNESIUM: Magnesium: 1.9 mg/dL (ref 1.7–2.4)

## 2023-07-03 LAB — PHOSPHORUS: Phosphorus: 1.9 mg/dL — ABNORMAL LOW (ref 2.5–4.6)

## 2023-07-03 MED ORDER — MIRTAZAPINE 15 MG PO TABS
15.0000 mg | ORAL_TABLET | Freq: Every day | ORAL | Status: DC
  Administered 2023-07-03 – 2023-07-04 (×2): 15 mg via ORAL
  Filled 2023-07-03 (×2): qty 1

## 2023-07-03 MED ORDER — VANCOMYCIN HCL 125 MG PO CAPS
125.0000 mg | ORAL_CAPSULE | Freq: Four times a day (QID) | ORAL | Status: DC
  Administered 2023-07-03 – 2023-07-05 (×11): 125 mg via ORAL
  Filled 2023-07-03 (×12): qty 1

## 2023-07-03 MED ORDER — GADOBUTROL 1 MMOL/ML IV SOLN
7.0000 mL | Freq: Once | INTRAVENOUS | Status: AC | PRN
  Administered 2023-07-03: 7 mL via INTRAVENOUS

## 2023-07-03 NOTE — Consult Note (Signed)
 Consultation  Referring Provider:      Primary Care Physician:  Jimmey Mould, MD Primary Gastroenterologist:         Reason for Consultation:              HPI:   [name] is a 58 y.o. male with an extensive medical history to include complicated steroid dependent Crohn's disease involving small and large intestine with perianal modifiers on prednisone  and Entyvio , autoimmune AIH/PSC overlap, anxiety and depression with history of suicidal attempt, history of basal cell skin cancer, history of alcohol and substance abuse who presented to the emergency department yesterday with diarrhea and a fall with presyncopal symptoms.  The patient states that his diarrhea was sudden in onset, starting Friday.  He had at least 10-12 large-volume watery bowel movements, which resulted in a syncopal episode on the toilet, prompting EMS call from his parents, whom he lives with.  He continued to have numerous watery bowel movements all day yesterday and overnight, although they are decreasing in volume.  He has some urgency, but denies any abdominal pain or blood in his stool.  He denies any nausea or vomiting.  No significant decrease in his appetite.  He says his weight has been stable. At baseline, he says he usually has 1 formed brown stool daily.  No problems with abdominal pain.  In the emergency department a CT of the abdomen pelvis showed mild suspected wall thickening of the descending colon suggestive of colitis.  There is also a new 3.7 x 2.8 cm lesion in the dome of the liver, not seen on previous imaging studies.  Laboratory evaluation was notable for hypokalemia with a potassium of 3, otherwise normal electrolytes.  Liver panel notable for mild liver enzyme elevation with ALT 88, AST 37, alk phos 140, as well as mild hypoproteinemia and hypoalbuminemia. He had a significant leukocytosis (WBC 27), normal hgb and mild thrombocytopenia (120, down from 219 in Feb).  Stool tests overnight  were notable for a positive C diff antigen and subsequent positive toxigenic C diff by PCR. He states that he has had C. difficile in the past, and his symptoms were very similar to his current symptoms.  He does not recall how quickly he responded to medications, or if he had recurrence.  He is followed by Dr. Willy Harvest, who last saw him in March 2025, at which time he was noted to have perianal erythema and skin breakdown consistent with dermatitis but no evidence of abscess.  He was given barrier cream and referred back to colorectal surgery.  He was seen by surgery April 7 and was noted to have an area of fluctuance in the left perianal region, which was treated with incision and drainage in the office.  A small amount of bloody purulent drainage was evacuated and the cavity was packed with gauze.  The surgeon's note indicates that he was prescribed doxycycline  about a month prior to this.  He denies any antibiotics in the past month.  No sick contacts or other close contacts with diarrheal illnesses.  He was last hospitalized in February for suicidal attempt, otherwise no recent hospitalizations  His last colonoscopy was in 2023 and showed numerous pseudopolyps, but no active colitis.    Crohn's disease of small and large intestine with history of fistula and perianal abscess   COLONOSCOPY/PATH DX 2001, REPEAT 2005 PATH NEG 2012: Active Crohn's right colon - ileum ok Intolerant of immunomodulators, CellCept, also intolerant of AZA though questionable. 2014  Crohn's flare and ischemic colitis Summer 2014 off prednisone  and back on Humira  2015 colonoscopy ileocolonic inflammation no I think improved 08/30/2016 - Entyvio  to start November 2021 vedolizumab  trough 9.4 mcg/dL going to monthly vedolizumab  2022 every 6-week vedolizumab  March 2023 colonoscopy with pseudopolyps/inflammatory polyps but mucosal biopsies were without active inflammatory bowel disease   Status post colorectal surgery care  with seton placement in the past for perirectal fistula and abscess   Posterior anal fissure on and off also   Iron  deficiency anemia chronic and recurrent has been treated with parenteral iron  at [facility] where he gets his Entyvio  infusions   Autoimmune hepatitis-PSC overlap with cirrhosis (also history of alcohol abuse)   AbnormalLFT's seen 8/ 2001 Marked elevation of transaminases Feb 2007, alkaline phosphatase 157, coags normal Positive anti-smooth muscle antibody at 1-80 February 2007, F. Acton antibody IgG 57 which is elevated, other serologies and celiac profile negative Liver biopsy May 11, 2005: Mild chronic hepatitis inflammation grade 1, no fibrosis identified, 2+ cirrhosis, prednisone  begun with apparent response, reduction in transaminases. The patient has always had minimal symptomatology Immune to HAV and HBV Treated with prednisone  and azathioprine though azathioprine stopped by patient, trial of Cellcept stopped by patient Liver biopsy #2 August 31, 2006 chronic mildly active hepatitis with focal portal fibrosis, iron  staining negative, SPEP normal, ANA negative anti-smooth muscle antibody still positive 01/11/07 MR/MRCP right hepatic lobe ducts irregular and beaded consistent with PSC, minimal perihepatic ascites, o/w negative [facility] evaluation, Dr. Orest Bio April and May 2009, MRCP suggests [facility] and raises ? of cholangiocarcinoma vs fibrosis right lobe of liver, CA 19-9 87 (NL <40), follow-up at [facility] or here suggested with repeat liver biospy to be considered. MR earlier 2010 without signs of cholabgiocarcinoma. Transaminase elevations have been fairly asymptomatic and reduction in levels has not always correlated with prednisone  use. MR MRCP 01/08/2011 shows progressive changes of inflammation in the right hepatic lobe and ducts without mass lesion 12/2011 MRCP 1. Slowly progressive hepatic fibrosis secondary to underlying  sclerosing cholangitis. No mass lesion  is identified to suggest  cholangiocarcinoma.  2. Stable intrahepatic biliary dilatation and beading of the  common bile duct. Possible tiny gallstones. No evidence of  choledocholithiasis. 12/2012 liver bx CHRONIC HEPATITIS WITH MODERATE ACTIVITY AND BRIDGING FIBROSIS restart prednisone  40 mg daily   July 2021 A. LIVER, LEFT, RANDOM, BIOPSY:  - Features of mild large bile duct obstruction, compatible with history  of primary sclerosing cholangitis. See comment  - Minimal portal fibrosis, Stage 0-1 of 4.      MRI/MRCP with and without contrast 11/02/2021 Unchanged appearance of liver compared to 2021 with severe atrophy in the posterior right lobe segments 6 and 8, beaded ducts consistent with PSC plus segmental intrahepatic biliary ductal dilation no common bile duct dilation and no suspicious liver lesions or enhancement     Portal vein thrombosis treated and resolved with anticoagulation though has left atrophic liver segments as above     History of mesenteric ischemia problems SMA stenosis and briefly went to hospice at this time as he decided to stop all treatment but recovered     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 06/15/2018   Cancer of skin  of neck    Cataract    Chronic mesenteric ischemia ([name])    Chronic pain syndrome 07/09/2016   Crohn's disease of small and large intestines ([name])    followed by dr Gorman Laughter gessner   Dairy product intolerance    Diarrhea, functional    Family history of adverse reaction to anesthesia    Grover's disease    transient acantholytic dermatosis   History of alcohol abuse    History of basal cell carcinoma excision    2013 left leg   History of Clostridium difficile    10/ 2014   History of multiple  concussions    x6   last one Jan 2017 per pt--  no residual   History of substance abuse ([name])    quit 1997 per pt   History of suicide attempt    05-18-2012  overdose /  failure to thrive   Iron  deficiency anemia due to chronic blood loss    Major depression, recurrent, chronic ([name])    Osteopenia    Personal history of adenomatous colonic polyps 12/2010, 03/2012   12/2010 - 8 mm serrated adenoma of rectum   Portal vein thrombosis 03/21/2015   right   Primary sclerosing cholangitis    ? 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   Substance abuse ([name]) 1997   Alcohol   Vitamin A  deficiency 05/23/2018   Vitamin B6 deficiency 07/27/2018   Vitamin C deficiency 05/23/2018    Past Surgical History:  Procedure Laterality Date   ABDOMINAL AORTAGRAM N/A 03/29/2012   Procedure: ABDOMINAL Tommi Fraise;  Surgeon: Margherita Shell, MD;  Location: [facility] CATH LAB;  Service: Cardiovascular;  Laterality: N/A;   ADENOIDECTOMY  age 46   BIOPSY  05/31/2018   Procedure: BIOPSY;  Surgeon: Janel Medford, MD;  Location: [facility] ENDOSCOPY;  Service: Endoscopy;;   CATARACT EXTRACTION W/ INTRAOCULAR LENS  IMPLANT, BILATERAL  2009   COLONOSCOPY  2001, 05/02/2003, 01/28/11   2012: Right colon Crohn's, rectal polyp   COLONOSCOPY  03/31/2012   Procedure: COLONOSCOPY;  Surgeon: Nannette Babe, MD;  Location: [name] ENDOSCOPY;  Service: Gastroenterology;  Laterality: N/A;   COLONOSCOPY WITH PROPOFOL  N/A 05/31/2018   Procedure: COLONOSCOPY WITH PROPOFOL ;  Surgeon: Janel Medford, MD;  Location: [facility] ENDOSCOPY;  Service: Endoscopy;  Laterality: N/A;   ESOPHAGOGASTRODUODENOSCOPY  01/28/2011   Normal   FOOT SURGERY Right age 31   MOHS SURGERY Left 11/2013   left ankle parakerotosis    PERCUTANEOUS LIVER BIOPSY  2007 and 2008   PILONIDAL CYST EXCISION  age 38   PLACEMENT OF SETON N/A 11/06/2015   Procedure: PLACEMENT OF SETON;  Surgeon: Joyce Nixon, MD;   Location: [facility];  Service: General;  Laterality: N/A;   PLACEMENT OF SETON  07/2018   at [facility]   RECTAL EXAM UNDER ANESTHESIA N/A 01/18/2019   Procedure: ANAL EXAM UNDER ANESTHESIA,  INCISION AND DRAINAGE, SETON PLACEMENT;  Surgeon: Joyce Nixon, MD;  Location: [facility];  Service: General;  Laterality: N/A;   UPPER GASTROINTESTINAL ENDOSCOPY      Family History  Problem Relation Age of Onset   Heart disease Father    Thyroid  cancer Father    Allergies Father    Clotting disorder Father    Breast cancer Mother    Stomach cancer Mother    Colon cancer Neg Hx    Esophageal cancer Neg Hx  Rectal cancer Neg Hx      Social History   Tobacco Use   Smoking status: Former    Current packs/day: 0.00    Average packs/day: 1 pack/day for 15.0 years (15.0 ttl pk-yrs)    Types: Cigarettes    Start date: 03/28/1982    Quit date: 03/28/1997    Years since quitting: 26.2   Smokeless tobacco: Never  Vaping Use   Vaping status: Never Used  Substance Use Topics   Alcohol use: No    Alcohol/week: 0.0 standard drinks of alcohol   Drug use: Not Currently    Types: Cocaine, Marijuana    Comment: QUIT USING DRUGS IN 1997    Prior to Admission medications   Medication Sig Start Date End Date Taking? Authorizing Provider  ALAWAY 0.035 % ophthalmic solution Place 1 drop into both eyes 2 (two) times daily as needed (for irritation).   Yes [provider]  hydrOXYzine  (ATARAX ) 25 MG tablet Take 1 tablet (25 mg total) by mouth 3 (three) times daily as needed for anxiety. Patient taking differently: Take 25 mg by mouth See admin instructions. Take 25 mg by mouth in the morning & afternoon- and an additional 25 mg once a day as needed for anxiety 05/19/23  Yes Silas Drivers, MD  LORazepam  (ATIVAN ) 1 MG tablet Take 1 tablet (1 mg total) by mouth every 4 (four) hours as needed (tremors, psychomotor agitation). 05/19/23  Yes Silas Drivers, MD   mirtazapine  (REMERON ) 30 MG tablet Take 30 mg by mouth at bedtime.   Yes [provider]  Nutritional Supplements (ENSURE HIGH PROTEIN) LIQD Take 237 mLs by mouth See admin instructions. Drink 237 ml's of CHOCOLATE HP Ensure by mouth 2 times a day   Yes [provider]  predniSONE  (DELTASONE ) 20 MG tablet TAKE 1.5 TABLETS (30 MG TOTAL) BY MOUTH DAILY WITH BREAKFAST. 06/22/23  Yes Kenney Peacemaker, MD  Vedolizumab  (ENTYVIO  IV) Inject into the vein every 6 (six) weeks.   Yes [provider]  venlafaxine  XR (EFFEXOR -XR) 150 MG 24 hr capsule Take 1 capsule (150 mg total) by mouth daily with breakfast. Patient taking differently: Take 150 mg by mouth See admin instructions. Take 150 mg by mouth in the morning with breakfast in conjunction with one 75 mg capsule to equal a total dose of 225 mg 05/20/23  Yes Parmar, Meenakshi, MD  venlafaxine  XR (EFFEXOR -XR) 75 MG 24 hr capsule Take 75 mg by mouth See admin instructions. Take 75 mg by mouth in the morning with breakfast in conjunction with one 150 mg capsule to equal a total dose of 225 mg   Yes [provider]  doxycycline  (VIBRA -TABS) 100 MG tablet Take 1 tablet (100 mg total) by mouth every 12 (twelve) hours. Patient not taking: Reported on 07/02/2023 05/19/23   Parmar, Meenakshi, MD  Na Sulfate-K Sulfate-Mg Sulfate concentrate (SUPREP) 17.5-3.13-1.6 GM/177ML SOLN Take 1 kit (354 mLs total) by mouth as directed. Patient not taking: Reported on 07/02/2023 05/30/23   Kenney Peacemaker, MD  neomycin -bacitracin -polymyxin [phone] OINT Apply 1 Application topically every 6 (six) hours as needed. Patient not taking: Reported on 07/02/2023 05/19/23   Silas Drivers, MD  Nystatin  (GERHARDT'S BUTT CREAM) CREA Apply to perianal skin bid-tid Disp # 1 jar Patient not taking: Reported on 07/02/2023 05/30/23   Kenney Peacemaker, MD    Current Facility-Administered Medications  Medication Dose Route Frequency Provider Last Rate Last Admin    0.9 %  sodium chloride  infusion  Intravenous Continuous Lind Repine, MD 125 mL/hr at 07/02/23 2136 New Bag at 07/02/23 2136   acetaminophen  (TYLENOL ) tablet 650 mg  650 mg Oral Q6H PRN Danice Dural, MD       Or   acetaminophen  (TYLENOL ) suppository 650 mg  650 mg Rectal Q6H PRN Danice Dural, MD       cefTRIAXone (ROCEPHIN) 2 g in sodium chloride  0.9 % 100 mL IVPB  2 g Intravenous Q24H Danice Dural, MD 200 mL/hr at 07/02/23 1827 2 g at 07/02/23 1827   enoxaparin  (LOVENOX ) injection 40 mg  40 mg Subcutaneous Q24H Danice Dural, MD   40 mg at 07/02/23 2131   hydrOXYzine  (ATARAX ) tablet 25 mg  25 mg Oral TID PRN Danice Dural, MD   25 mg at 07/03/23 4540   metroNIDAZOLE  (FLAGYL ) IVPB 500 mg  500 mg Intravenous Q12H Danice Dural, MD 100 mL/hr at 07/03/23 0537 500 mg at 07/03/23 0537   mirtazapine  (REMERON ) tablet 30 mg  30 mg Oral QHS Danice Dural, MD   30 mg at 07/02/23 2131   ondansetron  (ZOFRAN ) tablet 4 mg  4 mg Oral Q6H PRN Danice Dural, MD   4 mg at 07/02/23 2037   Or   ondansetron  (ZOFRAN ) injection 4 mg  4 mg Intravenous Q6H PRN Danice Dural, MD       predniSONE  (DELTASONE ) tablet 30 mg  30 mg Oral Q breakfast Danice Dural, MD   30 mg at 07/03/23 9811   protein supplement (ENSURE MAX) liquid  237 mL Oral BID Danice Dural, MD       venlafaxine  XR (EFFEXOR -XR) 24 hr capsule 150 mg  150 mg Oral Daily Danice Dural, MD       venlafaxine  XR (EFFEXOR -XR) 24 hr capsule 75 mg  75 mg Oral Daily Danice Dural, MD        Allergies as of 07/02/2023 - Review Complete 07/02/2023  Allergen Reaction Noted   Tape Itching, Rash, and Other (See Comments) 03/21/2015   Asacol  [mesalamine ] Diarrhea and Other (See Comments) 03/09/2011   Azathioprine Diarrhea and Other (See Comments) 08/07/2010   Gluten meal Diarrhea 03/21/2015   Metoprolol  tartrate Nausea Only and Other (See Comments) 01/17/2019   Mycophenolate mofetil  Diarrhea and Other (See Comments) 08/07/2010   Other Diarrhea and Other (See Comments) 01/18/2012   Wheat Diarrhea and Other (See Comments) 01/18/2012   Abilify  [aripiprazole ] Diarrhea 05/30/2023   Amoxicillin  Other (See Comments) 08/22/2018     Review of Systems:    As per HPI, otherwise negative    Physical Exam:  Vital signs in last 24 hours: Temp:  [97.5 F (36.4 C)-98.5 F (36.9 C)] 98.1 F (36.7 C) (05/04 0453) Pulse Rate:  [60-85] 69 (05/04 0453) Resp:  [16-20] 16 (05/04 0453) BP: (113-136)/(61-81) 113/65 (05/04 0453) SpO2:  [95 %-100 %] 99 % (05/04 0453) Weight:  [74.3 kg-74.8 kg] 74.3 kg (05/04 0402) Last BM Date : 07/02/23 General:   Pleasant Caucasian male in NAD Head:  Normocephalic and atraumatic. Eyes:   No icterus.   Conjunctiva pink. Ears:  Normal auditory acuity. Neck:  Supple Lungs:  Respirations even and unlabored. Lungs clear to auscultation bilaterally.   No wheezes, crackles, or rhonchi.  Heart:  Regular rate and rhythm; no MRG Abdomen:  Soft, nondistended, nontender.  Hyperactive bowel sounds. No appreciable masses or hepatomegaly.  Rectal: Small amount of yellowish drainage arising from right sided fistulous opening.  Mild tenderness  to palpation on left perianal aspect, without any significant swelling, redness or induration.  Digital exam deferred Msk:  Symmetrical without gross deformities.  Extremities:  Without edema. Neurologic:  Alert and  oriented x4;  grossly normal neurologically. Skin:  Intact without significant lesions or rashes. Psych:  Alert and cooperative. Normal affect.  LAB RESULTS: Recent Labs    07/02/23 1049 07/03/23 0700  WBC 27.4* 20.0*  HGB 15.1 13.7  HCT 45.3 41.7  PLT 120* 112*   BMET Recent Labs    07/02/23 1049  NA 136  K 3.0*  CL 102  CO2 24  GLUCOSE 82  BUN 25*  CREATININE 1.41*  CALCIUM 7.5*   LFT Recent Labs    07/02/23 1049  PROT 5.0*  ALBUMIN 2.4*  AST 37  ALT 88*  ALKPHOS 140*  BILITOT  0.9   PT/INR No results for input(s): "LABPROT", "INR" in the last 72 hours.  STUDIES: CT ABDOMEN PELVIS WO CONTRAST Result Date: 07/02/2023 CLINICAL DATA:  Abdominal pain. Nonlocalized. History of primary sclerosing cholangitis. Stomach is unremarkable. No gastric wall thickening. No evidence of outlet obstruction. Duodenum is normally positioned as is the ligament of Treitz. EXAM: CT ABDOMEN AND PELVIS WITHOUT CONTRAST TECHNIQUE: Multidetector CT imaging of the abdomen and pelvis was performed following the standard protocol without IV contrast. RADIATION DOSE REDUCTION: This exam was performed according to the departmental dose-optimization program which includes automated exposure control, adjustment of the mA and/or kV according to patient size and/or use of iterative reconstruction technique. COMPARISON:  MRI 10/31/2021.  CT 07/20/2018. FINDINGS: Lower chest: Dependent atelectasis in both lung bases. Hepatobiliary: Stable deformity of the liver with atrophy of right hepatic parenchyma. Lobular contour of the left liver is similar. 4.7 cm hepatic cyst was 4.3 cm on previous MRI. There is a new 3.7 x 2.8 cm non cystic lesion in the dome of the liver (19/2) not present on the prior studies. Tiny calcified gallstone evident. No intrahepatic or extrahepatic biliary dilation. Pancreas: No focal mass lesion. No dilatation of the main duct. No intraparenchymal cyst. No peripancreatic edema. Spleen: No splenomegaly. No suspicious focal mass lesion. Adrenals/Urinary Tract: No adrenal nodule or mass. Kidneys unremarkable. No evidence for hydroureter. The urinary bladder appears normal for the degree of distention. Stomach/Bowel: Stomach is unremarkable. No gastric wall thickening. No evidence of outlet obstruction. Duodenum is normally positioned as is the ligament of Treitz. No small bowel wall thickening. No small bowel dilatation. The terminal ileum is normal. The appendix is not well visualized, but there is no  edema or inflammation in the region of the cecal tip to suggest appendicitis. No gross colonic mass. No colonic wall thickening. Diverticular changes are noted in the left colon. There is mild ill definition of the wall of the descending colon with subtle pericolonic edema in the same region. Vascular/Lymphatic: There is moderate atherosclerotic calcification of the abdominal aorta without aneurysm. There is no gastrohepatic or hepatoduodenal ligament lymphadenopathy. No retroperitoneal or mesenteric lymphadenopathy. No pelvic sidewall lymphadenopathy. Reproductive: The prostate gland and seminal vesicles are unremarkable. Other: No intraperitoneal free fluid. Musculoskeletal: No worrisome lytic or sclerotic osseous abnormality. IMPRESSION: 1. Mild ill definition of the wall of the descending colon with subtle pericolonic edema in the same region. Imaging features suggest colitis. 2. New 3.7 x 2.8 cm non-cystic lesion in the dome of the liver not present on the prior studies. Given the history of primary sclerosing cholangitis, close follow-up and further evaluation warranted to exclude cholangiocarcinoma. MRI abdomen with  and without contrast recommended to further evaluate and could be performed on a short term nonemergent outpatient basis. 3. Cholelithiasis. 4.  Aortic Atherosclerosis (ICD10-I70.0). Electronically Signed   By: Donnal Fusi M.D.   On: 07/02/2023 11:45     PREVIOUS ENDOSCOPIES:            See above   Impression / Plan:   58 year old male with history of Crohn's disease involving small and large bowel with perianal modifiers with recent incision and drainage of perianal abscess, who developed abrupt onset severe diarrhea with associated syncopal episode, found to have positive C. difficile testing.  CT also notable for new 3 cm liver lesion.  Given his history of PSC/cirrhosis, there is significant concern of malignancy.  C diff colitis - Start vancomycin  125 mg PO QID for 14 days -  Okay to advance to liquid diet this morning, if tolerating well, can advance throughout the day - No plans for endoscopic evaluation  New liver lesion in setting of PSC/cirrhosis - Obtain MRI this admission to further characterize new lesion - CA 19-9, AFP  Crohn's disease with perianal fistula - Okay to continue outpatient prednisone  30 mg while inpatient - Tentatively plan to continue outpatient Entyvio  infusion (next infusion several weeks from now) - No concerns for perianal abscess/infection at this time  Dr. Venice Gillis will be taking over [facility] inpatient GI service tomorrow  Thanks   LOS: 1 day   Elois Hair  07/03/2023, 7:51 AM

## 2023-07-03 NOTE — Progress Notes (Signed)
  Echocardiogram 2D Echocardiogram has been performed.  Royden Corin 07/03/2023, 1:09 PM

## 2023-07-03 NOTE — Progress Notes (Signed)
2D echo attempted, patient eating. Will try later

## 2023-07-03 NOTE — Progress Notes (Signed)
 PROGRESS NOTE    [name]  UEK:[id number] DOB: [birth year] DOA: 07/02/2023 PCP: Jimmey Mould, MD   Brief Narrative:  58 year old male with history of an fistula, anxiety, osteoarthritis, autoimmune hepatitis, chronic mesenteric ischemia, chronic pain syndrome, Crohn's disease, C. difficile colitis, depression, portal vein thrombosis, primary sclerosing cholangitis presented with multiple episodes of diarrhea with dizziness and fall and near syncope.  On presentation, potassium was 3, creatinine of 1.41.  CT of abdomen and pelvis showed possible colitis and noncystic lesion in the liver, MRI recommended.  He was started on antibiotics.  GI was consulted.  He also had suicidal ideation: Psychiatry consulted.  Assessment & Plan:   C. difficile colitis -Patient presented with diarrhea and imaging showed possible colitis.  Initially started on broad-spectrum antibiotics with Rocephin and Flagyl .  Tested positive for C. difficile.  DC Rocephin and Flagyl .  Start oral vancomycin .  History of Crohn's disease with concern for exacerbation - GI evaluation pending.  Currently on prednisone .  Leukocytosis -Improving.  Monitor  Thrombocytopenia -Questionable cause.  No signs of bleeding.  Monitor  Hypokalemia -Labs pending today  Essential hypertension - Blood pressure stable.  Antihypertensives as needed  Protein calorie malnutrition, severe - Consult nutrition  PTSD MDD Suicidal ideation - Continue mirtazapine  and venlafaxine  XR along with hydroxyzine  as needed.  [employer].  Follow psychiatry recommendations.  DVT prophylaxis: Lovenox  Code Status: Full Family Communication: None at bedside Disposition Plan: Status is: Inpatient Remains inpatient appropriate because: Of severity of illness    Consultants: GI/psychiatry  Procedures: None  Antimicrobials:  Anti-infectives (From admission, onward)    Start     Dose/Rate Route Frequency Ordered Stop    07/02/23 1830  cefTRIAXone (ROCEPHIN) 2 g in sodium chloride  0.9 % 100 mL IVPB        2 g 200 mL/hr over 30 Minutes Intravenous Every 24 hours 07/02/23 1742     07/02/23 1830  metroNIDAZOLE  (FLAGYL ) IVPB 500 mg        500 mg 100 mL/hr over 60 Minutes Intravenous Every 12 hours 07/02/23 1742          Subjective: Patient seen and examined at bedside.  Still complains of intermittent diarrhea with abdominal pain.  No fever or vomiting reported.  Objective: Vitals:   07/02/23 2041 07/03/23 0141 07/03/23 0402 07/03/23 0453  BP: 115/76 119/62  113/65  Pulse: 64 60  69  Resp: 20 16  16   Temp: 98.1 F (36.7 C) 97.7 F (36.5 C)  98.1 F (36.7 C)  TempSrc: Oral Oral  Oral  SpO2: 96% 98%  99%  Weight:   74.3 kg   Height:   6\' 1"  (1.854 m)     Intake/Output Summary (Last 24 hours) at 07/03/2023 0804 Last data filed at 07/03/2023 1505 Gross per 24 hour  Intake 1960.58 ml  Output 1405 ml  Net 555.58 ml   Filed Weights   07/02/23 1031 07/03/23 0402  Weight: 74.8 kg 74.3 kg    Examination:  General exam: Appears calm and comfortable.  Respiratory system: Bilateral decreased breath sounds at bases, no wheezing Cardiovascular system: S1 & S2 heard, Rate controlled Gastrointestinal system: Abdomen is nondistended, soft and mildly tender.  Normal bowel sounds heard. Extremities: No cyanosis, clubbing, edema  Central nervous system: Alert and oriented.  Poor historian.  Slow to respond.  No focal neurological deficits. Moving extremities Skin: No rashes, lesions or ulcers Psychiatry: Flat affect.  Not agitated.    Data Reviewed: I have personally  reviewed following labs and imaging studies  CBC: Recent Labs  Lab 07/02/23 1049 07/03/23 0700  WBC 27.4* 20.0*  NEUTROABS 23.8*  --   HGB 15.1 13.7  HCT 45.3 41.7  MCV 96.0 97.7  PLT 120* 112*   Basic Metabolic Panel: Recent Labs  Lab 07/02/23 1049  NA 136  K 3.0*  CL 102  CO2 24  GLUCOSE 82  BUN 25*  CREATININE 1.41*   CALCIUM 7.5*   GFR: Estimated Creatinine Clearance: 58.6 mL/min (A) (by C-G formula based on SCr of 1.41 mg/dL (H)). Liver Function Tests: Recent Labs  Lab 07/02/23 1049  AST 37  ALT 88*  ALKPHOS 140*  BILITOT 0.9  PROT 5.0*  ALBUMIN 2.4*   No results for input(s): "LIPASE", "AMYLASE" in the last 168 hours. No results for input(s): "AMMONIA" in the last 168 hours. Coagulation Profile: No results for input(s): "INR", "PROTIME" in the last 168 hours. Cardiac Enzymes: No results for input(s): "CKTOTAL", "CKMB", "CKMBINDEX", "TROPONINI" in the last 168 hours. BNP (last 3 results) No results for input(s): "PROBNP" in the last 8760 hours. HbA1C: No results for input(s): "HGBA1C" in the last 72 hours. CBG: No results for input(s): "GLUCAP" in the last 168 hours. Lipid Profile: No results for input(s): "CHOL", "HDL", "LDLCALC", "TRIG", "CHOLHDL", "LDLDIRECT" in the last 72 hours. Thyroid  Function Tests: No results for input(s): "TSH", "T4TOTAL", "FREET4", "T3FREE", "THYROIDAB" in the last 72 hours. Anemia Panel: No results for input(s): "VITAMINB12", "FOLATE", "FERRITIN", "TIBC", "IRON ", "RETICCTPCT" in the last 72 hours. Sepsis Labs: No results for input(s): "PROCALCITON", "LATICACIDVEN" in the last 168 hours.  Recent Results (from the past 240 hours)  Culture, blood (Routine X 2) w Reflex to ID Panel     Status: None (Preliminary result)   Collection Time: 07/02/23  6:24 PM   Specimen: BLOOD RIGHT ARM  Result Value Ref Range Status   Specimen Description BLOOD RIGHT ARM  Final   Special Requests   Final    Blood Culture results may not be optimal due to an inadequate volume of blood received in culture bottles AEROBIC BOTTLE ONLY   Culture   Final    NO GROWTH < 12 HOURS Performed at [facility], [address] [place] [address], [place], [place] [address]    Report Status PENDING  Incomplete  Culture, blood (Routine X 2) w Reflex to ID Panel     Status: None (Preliminary result)    Collection Time: 07/02/23  6:24 PM   Specimen: BLOOD RIGHT ARM  Result Value Ref Range Status   Specimen Description BLOOD RIGHT ARM  Final   Special Requests   Final    Blood Culture results may not be optimal due to an inadequate volume of blood received in culture bottles AEROBIC BOTTLE ONLY   Culture   Final    NO GROWTH < 12 HOURS Performed at [facility], [address] [place], [place] [address]    Report Status PENDING  Incomplete  C Difficile Quick Screen w PCR reflex     Status: Abnormal   Collection Time: 07/02/23  7:47 PM   Specimen: STOOL  Result Value Ref Range Status   C Diff antigen POSITIVE (A) NEGATIVE Final   C Diff toxin NEGATIVE NEGATIVE Final   C Diff interpretation Results are indeterminate. See PCR results.  Final    Comment: Performed at [facility], [address], [place], [place] [address]  C. Diff by PCR, Reflexed  Status: Abnormal   Collection Time: 07/02/23  7:47 PM  Result Value Ref Range Status   Toxigenic C. Difficile by PCR POSITIVE (A) NEGATIVE Final    Comment: Positive for toxigenic C. difficile with little to no toxin production. Only treat if clinical presentation suggests symptomatic illness. Performed at [facility], [address], [place], [place] [address]          Radiology Studies: CT ABDOMEN PELVIS WO CONTRAST Result Date: 07/02/2023 CLINICAL DATA:  Abdominal pain. Nonlocalized. History of primary sclerosing cholangitis. Stomach is unremarkable. No gastric wall thickening. No evidence of outlet obstruction. Duodenum is normally positioned as is the ligament of Treitz. EXAM: CT ABDOMEN AND PELVIS WITHOUT CONTRAST TECHNIQUE: Multidetector CT imaging of the abdomen and pelvis was performed following the standard protocol without IV contrast. RADIATION DOSE REDUCTION: This exam was performed according to the departmental dose-optimization program which includes automated exposure control, adjustment of  the mA and/or kV according to patient size and/or use of iterative reconstruction technique. COMPARISON:  MRI 10/31/2021.  CT 07/20/2018. FINDINGS: Lower chest: Dependent atelectasis in both lung bases. Hepatobiliary: Stable deformity of the liver with atrophy of right hepatic parenchyma. Lobular contour of the left liver is similar. 4.7 cm hepatic cyst was 4.3 cm on previous MRI. There is a new 3.7 x 2.8 cm non cystic lesion in the dome of the liver (19/2) not present on the prior studies. Tiny calcified gallstone evident. No intrahepatic or extrahepatic biliary dilation. Pancreas: No focal mass lesion. No dilatation of the main duct. No intraparenchymal cyst. No peripancreatic edema. Spleen: No splenomegaly. No suspicious focal mass lesion. Adrenals/Urinary Tract: No adrenal nodule or mass. Kidneys unremarkable. No evidence for hydroureter. The urinary bladder appears normal for the degree of distention. Stomach/Bowel: Stomach is unremarkable. No gastric wall thickening. No evidence of outlet obstruction. Duodenum is normally positioned as is the ligament of Treitz. No small bowel wall thickening. No small bowel dilatation. The terminal ileum is normal. The appendix is not well visualized, but there is no edema or inflammation in the region of the cecal tip to suggest appendicitis. No gross colonic mass. No colonic wall thickening. Diverticular changes are noted in the left colon. There is mild ill definition of the wall of the descending colon with subtle pericolonic edema in the same region. Vascular/Lymphatic: There is moderate atherosclerotic calcification of the abdominal aorta without aneurysm. There is no gastrohepatic or hepatoduodenal ligament lymphadenopathy. No retroperitoneal or mesenteric lymphadenopathy. No pelvic sidewall lymphadenopathy. Reproductive: The prostate gland and seminal vesicles are unremarkable. Other: No intraperitoneal free fluid. Musculoskeletal: No worrisome lytic or sclerotic  osseous abnormality. IMPRESSION: 1. Mild ill definition of the wall of the descending colon with subtle pericolonic edema in the same region. Imaging features suggest colitis. 2. New 3.7 x 2.8 cm non-cystic lesion in the dome of the liver not present on the prior studies. Given the history of primary sclerosing cholangitis, close follow-up and further evaluation warranted to exclude cholangiocarcinoma. MRI abdomen with and without contrast recommended to further evaluate and could be performed on a short term nonemergent outpatient basis. 3. Cholelithiasis. 4.  Aortic Atherosclerosis (ICD10-I70.0). Electronically Signed   By: Donnal Fusi M.D.   On: 07/02/2023 11:45        Scheduled Meds:  enoxaparin  (LOVENOX ) injection  40 mg Subcutaneous Q24H   mirtazapine   30 mg Oral QHS   predniSONE   30 mg Oral Q breakfast   Ensure Max Protein  237 mL Oral BID  venlafaxine  XR  150 mg Oral Daily   venlafaxine  XR  75 mg Oral Daily   Continuous Infusions:  sodium chloride  125 mL/hr at 07/02/23 2136   cefTRIAXone (ROCEPHIN)  IV 2 g (07/02/23 1827)   metronidazole  500 mg (07/03/23 0537)          Audria Leather, MD [facility] 07/03/2023, 8:04 AM '

## 2023-07-03 NOTE — Plan of Care (Signed)
 Pt unable to urinate this am. Bladder scan reveals <986ml. Hospitalist notified, awaiting cath order. Problem: Education: Goal: Knowledge of General Education information will improve Description: Including pain rating scale, medication(s)/side effects and non-pharmacologic comfort measures Outcome: Progressing   Problem: Health Behavior/Discharge Planning: Goal: Ability to manage health-related needs will improve Outcome: Progressing   Problem: Clinical Measurements: Goal: Ability to maintain clinical measurements within normal limits will improve Outcome: Progressing Goal: Will remain free from infection Outcome: Progressing Goal: Diagnostic test results will improve Outcome: Progressing Goal: Respiratory complications will improve Outcome: Progressing Goal: Cardiovascular complication will be avoided Outcome: Progressing   Problem: Activity: Goal: Risk for activity intolerance will decrease Outcome: Progressing   Problem: Nutrition: Goal: Adequate nutrition will be maintained Outcome: Progressing   Problem: Coping: Goal: Level of anxiety will decrease Outcome: Progressing   Problem: Elimination: Goal: Will not experience complications related to bowel motility Outcome: Progressing Goal: Will not experience complications related to urinary retention Outcome: Progressing   Problem: Pain Managment: Goal: General experience of comfort will improve and/or be controlled Outcome: Progressing   Problem: Safety: Goal: Ability to remain free from injury will improve Outcome: Progressing   Problem: Skin Integrity: Goal: Risk for impaired skin integrity will decrease Outcome: Progressing

## 2023-07-03 NOTE — Plan of Care (Signed)
  Problem: Health Behavior/Discharge Planning: Goal: Ability to manage health-related needs will improve Outcome: Progressing   Problem: Clinical Measurements: Goal: Will remain free from infection Outcome: Progressing   Problem: Nutrition: Goal: Adequate nutrition will be maintained Outcome: Progressing   Problem: Coping: Goal: Level of anxiety will decrease Outcome: Progressing   Problem: Elimination: Goal: Will not experience complications related to bowel motility Outcome: Progressing Goal: Will not experience complications related to urinary retention Outcome: Progressing   Problem: Safety: Goal: Ability to remain free from injury will improve Outcome: Progressing   Problem: Skin Integrity: Goal: Risk for impaired skin integrity will decrease Outcome: Progressing
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
## 2023-07-04 DIAGNOSIS — K50814 Crohn's disease of both small and large intestine with abscess: Secondary | ICD-10-CM | POA: Diagnosis not present

## 2023-07-04 DIAGNOSIS — D72829 Elevated white blood cell count, unspecified: Secondary | ICD-10-CM

## 2023-07-04 DIAGNOSIS — K769 Liver disease, unspecified: Secondary | ICD-10-CM | POA: Diagnosis not present

## 2023-07-04 DIAGNOSIS — A0472 Enterocolitis due to Clostridium difficile, not specified as recurrent: Secondary | ICD-10-CM

## 2023-07-04 DIAGNOSIS — F331 Major depressive disorder, recurrent, moderate: Secondary | ICD-10-CM | POA: Diagnosis not present

## 2023-07-04 DIAGNOSIS — K5090 Crohn's disease, unspecified, without complications: Secondary | ICD-10-CM | POA: Diagnosis not present

## 2023-07-04 DIAGNOSIS — F1011 Alcohol abuse, in remission: Secondary | ICD-10-CM

## 2023-07-04 DIAGNOSIS — D696 Thrombocytopenia, unspecified: Secondary | ICD-10-CM

## 2023-07-04 DIAGNOSIS — K754 Autoimmune hepatitis: Secondary | ICD-10-CM

## 2023-07-04 LAB — COMPREHENSIVE METABOLIC PANEL WITH GFR
ALT: 84 U/L — ABNORMAL HIGH (ref 0–44)
AST: 45 U/L — ABNORMAL HIGH (ref 15–41)
Albumin: 2.5 g/dL — ABNORMAL LOW (ref 3.5–5.0)
Alkaline Phosphatase: 157 U/L — ABNORMAL HIGH (ref 38–126)
Anion gap: 5 (ref 5–15)
BUN: 16 mg/dL (ref 6–20)
CO2: 27 mmol/L (ref 22–32)
Calcium: 8.1 mg/dL — ABNORMAL LOW (ref 8.9–10.3)
Chloride: 110 mmol/L (ref 98–111)
Creatinine, Ser: 0.99 mg/dL (ref 0.61–1.24)
GFR, Estimated: >60 mL/min (ref >60)
Glucose, Bld: 79 mg/dL (ref 70–99)
Potassium: 3.4 mmol/L — ABNORMAL LOW (ref 3.5–5.1)
Sodium: 142 mmol/L (ref 135–145)
Total Bilirubin: 0.6 mg/dL (ref 0.0–1.2)
Total Protein: 5.3 g/dL — ABNORMAL LOW (ref 6.5–8.1)

## 2023-07-04 LAB — MAGNESIUM: Magnesium: 2.1 mg/dL (ref 1.7–2.4)

## 2023-07-04 LAB — PROTIME-INR
INR: 1 (ref 0.8–1.2)
Prothrombin Time: 13.1 s (ref 11.4–15.2)

## 2023-07-04 LAB — CBC WITH DIFFERENTIAL/PLATELET
Abs Immature Granulocytes: 0.21 10*3/uL — ABNORMAL HIGH (ref 0.00–0.07)
Basophils Absolute: 0.1 10*3/uL (ref 0.0–0.1)
Basophils Relative: 0 %
Eosinophils Absolute: 0.1 10*3/uL (ref 0.0–0.5)
Eosinophils Relative: 1 %
HCT: 44.8 % (ref 39.0–52.0)
Hemoglobin: 14.6 g/dL (ref 13.0–17.0)
Immature Granulocytes: 2 %
Lymphocytes Relative: 9 %
Lymphs Abs: 1.1 10*3/uL (ref 0.7–4.0)
MCH: 31.8 pg (ref 26.0–34.0)
MCHC: 32.6 g/dL (ref 30.0–36.0)
MCV: 97.6 fL (ref 80.0–100.0)
Monocytes Absolute: 0.9 10*3/uL (ref 0.1–1.0)
Monocytes Relative: 8 %
Neutro Abs: 9.8 10*3/uL — ABNORMAL HIGH (ref 1.7–7.7)
Neutrophils Relative %: 80 %
Platelets: 122 10*3/uL — ABNORMAL LOW (ref 150–400)
RBC: 4.59 MIL/uL (ref 4.22–5.81)
RDW: 13.2 % (ref 11.5–15.5)
WBC: 12.2 10*3/uL — ABNORMAL HIGH (ref 4.0–10.5)
nRBC: 0 % (ref 0.0–0.2)

## 2023-07-04 LAB — CANCER ANTIGEN 19-9: CA 19-9: 897 U/mL — ABNORMAL HIGH (ref 0–35)

## 2023-07-04 LAB — AFP TUMOR MARKER: AFP, Serum, Tumor Marker: <1.8 ng/mL (ref 0.0–8.4)

## 2023-07-04 MED ORDER — POTASSIUM CHLORIDE CRYS ER 20 MEQ PO TBCR
40.0000 meq | EXTENDED_RELEASE_TABLET | Freq: Once | ORAL | Status: AC
  Administered 2023-07-04: 40 meq via ORAL
  Filled 2023-07-04: qty 2

## 2023-07-04 MED ORDER — LORAZEPAM 2 MG/ML IJ SOLN
1.0000 mg | Freq: Once | INTRAMUSCULAR | Status: AC
  Administered 2023-07-04: 1 mg via INTRAVENOUS
  Filled 2023-07-04: qty 1

## 2023-07-04 MED ORDER — BUSPIRONE HCL 5 MG PO TABS
7.5000 mg | ORAL_TABLET | Freq: Two times a day (BID) | ORAL | Status: DC
  Administered 2023-07-04 – 2023-07-05 (×3): 7.5 mg via ORAL
  Filled 2023-07-04 (×3): qty 2

## 2023-07-04 NOTE — Plan of Care (Signed)
  Problem: Education: Goal: Knowledge of General Education information will improve Description: Including pain rating scale, medication(s)/side effects and non-pharmacologic comfort measures Outcome: Progressing   Problem: Clinical Measurements: Goal: Will remain free from infection Outcome: Progressing   Problem: Activity: Goal: Risk for activity intolerance will decrease Outcome: Progressing   Problem: Nutrition: Goal: Adequate nutrition will be maintained Outcome: Progressing   Problem: Coping: Goal: Level of anxiety will decrease Outcome: Progressing   Problem: Elimination: Goal: Will not experience complications related to bowel motility Outcome: Progressing Goal: Will not experience complications related to urinary retention Outcome: Progressing   Problem: Pain Managment: Goal: General experience of comfort will improve and/or be controlled Outcome: Progressing   Problem: Safety: Goal: Ability to remain free from injury will improve Outcome: Progressing   Problem: Skin Integrity: Goal: Risk for impaired skin integrity will decrease Outcome: Progressing

## 2023-07-04 NOTE — Progress Notes (Signed)
 PT Cancellation Note  Patient Details Name: [name] MRN: [id number] DOB: 02/24/1966   Cancelled Treatment:    Reason Eval/Treat Not Completed:  Attempted PT eval-pt declined participation today. Pt stated "I'm not ready for that yet." Spoke with RN who reported pt had to receive some Ativan  just prior to my arrival. Will check back on tomorrow.    Tanda Falter, PT Acute Rehabilitation  Office: [phone]

## 2023-07-04 NOTE — Consult Note (Signed)
 [facility] Psychiatric Consult Follow-up  Patient Name: .[name]  MRN: [id number]  DOB: October 03, 1965  Consult Order details:  Orders (From admission, onward)     Start     Ordered   04/13/23 1009  CONSULT TO CALL ACT TEAM       Ordering Provider: Karlyn Overman, MD  Provider:  (Not yet assigned)  Question:  Reason for Consult?  Answer:  suicide attempt   04/13/23 1008             Mode of Visit: In person    Psychiatry Consult Evaluation  Service Date: Jul 04, 2023 LOS:  LOS: 2 days  Chief Complaint "The medications are working" with no suicidal ideations "right now"  Primary Psychiatric Diagnoses  Major Depressive Disorder, recurrent moderate Insomnia  3.  GAD (generalized anxiety disorder)  Assessment  [name] is a 58 y.o. male admitted: Presented to the ED on 07/02/2023 10:25 AM for diarrhea, diagnosed with C-diff. He carries the psychiatric diagnoses of Major Depressive Disorder, recurrent, Insomnia, and GAD (generalized anxiety disorder) and has a past medical history of fistula, osteoarthritis, autoimmune hepatitis, chronic mesenteric ischemia, chronic pain syndrome, Crohn's disease, C. difficile colitis, portal vein thrombosis, primary sclerosing cholangitis   His current presentation of dysphoric mood, intermittent suicidal ideations, low motivation and energy, anhedonia, and self-isolating. Current outpatient psychotropic medications include buspar , lexapro , remeron  and historically he has had a positive response to these medications. He was compliant with medications prior to admission as evidenced by his report. On initial examination, patient is cooperative, and appears to be sad. Please see plan below for detailed recommendations.   Patient seen and assessed, following a panic attack.  He reports only having 3 panic attacks his entire adult life.  Symptoms have increased recently within the past few months.  He contributes to his increase in  symptoms due to multiple conditions, however primary issue is ailing medical health.  He reports having about 1 panic attack a month, unable to identify the reason for today's panic attack.  He has been experiencing significant GI distress.  He denies depressive symptoms at this time to include sadness, weight loss, anxiety, worthlessness, and hopelessness.  He does endorse anxiety/panic attack to include sweating, nausea and vomiting, nervousness; denies choking, shortness of breath and or chest pain.  His panic attacks are sometimes triggered out of the blue.  He does describe some anticipatory anxiety, related to when he will have his next panic attack.  This was seen when patient asked GI provided to return due to ongoing panic attacks despite no physical panic attack symptoms. Endorses excessive worrying about everyday circumstances, and many criteria for GAD described under depression below. Morbid ruminations.  He denies suicidal ideations, homicidal ideations, and auditory or visual hallucinations.  He does admit to recent suicide attempt within the past 3 months, however reports since discharge he has been more stable, compliant with his medication, and free of acute psychiatric symptoms at this time.  Diagnoses:  Active Hospital problems: Principal Problem:   Exacerbation of Crohn's disease ([name]) Active Problems:   Hyperlipidemia   Essential hypertension   Dehydration   Hypokalemia   Enteritis due to Clostridium difficile   PTSD (post-traumatic stress disorder)   MDD (major depressive disorder)   Tremor   AKI (acute kidney injury) ([name])   Protein-calorie malnutrition, severe ([name])   Transaminitis   Thrombocytopenia ([name])   Near syncope   Leukocytosis   Suicidal ideation   Abnormal finding on  imaging of liver   PSC (primary sclerosing cholangitis)   Major depressive disorder, recurrent episode, moderate ([name])    Plan   ## Psychiatric Medication Recommendations:  -hydroxyzine  25  mg TID PRN anxiety -Effexor  225 mg daily -Remeron  30 mg at bedtime decreased to 15 mg to better target the histamine receptor - Restart BuSpar  7.5 mg p.o. twice daily for anxiety and panic attacks  ## Medical Decision Making Capacity:  Patient is his own legal guardian  ## Further Work-up:  -- No further work up needed at this time  EKG or UDS -- most recent EKG on 07/02/23 had QtC of 443 -- Pertinent labwork reviewed earlier this admission includes: CMP, BNP, EKG, UDS   ## Disposition:-- to be determined, not suicidal today  - IVC entered incorrectly, there appears to be no petition or first exam completed on this patient.  Will discontinue IVC ordered and remove manner at this time. ## Behavioral / Environmental: -To minimize splitting of staff, assign one staff person to communicate all information from the team when feasible. or Utilize compassion and acknowledge the patient's experiences while setting clear and realistic expectations for care.    ## Safety and Observation Level:  - Based on my clinical evaluation, I estimate the patient to be at low risk of self harm in the current setting. - At this time, we recommend  routine. This decision is based on my review of the chart including patient's history and current presentation, interview of the patient, mental status examination, and consideration of suicide risk including evaluating suicidal ideation, plan, intent, suicidal or self-harm behaviors, risk factors, and protective factors. This judgment is based on our ability to directly address suicide risk, implement suicide prevention strategies, and develop a safety plan while the patient is in the clinical setting. Please contact our team if there is a concern that risk level has changed.  CSSR Risk Category:C-SSRS RISK CATEGORY: High Risk  Suicide Risk Assessment: Patient has following modifiable risk factors for suicide: under treated depression  and social isolation, which we are  addressing by recommending inpatient psychiatric admission. Patient has following non-modifiable or demographic risk factors for suicide: male gender, history of suicide attempt, history of self harm behavior, and psychiatric hospitalization Patient has the following protective factors against suicide: Supportive family  Thank you for this consult request. Recommendations have been communicated to the primary team.  We will continue to follow patient at this time.   Rella Cardinal, FNP       History of Present Illness  Relevant Aspects of Hospital ED Course:  Admitted on 07/02/2023 for suicide attempt with intentional overdose.  Patient Report: Smith Dunk, 58 y.o., male Patient was seen face-to-face by this provider for evaluation of depression and anxiety with a history of prior suicide attempts. On today's assessment, he reports his depression is currently "low," stating that "the medications are working." He denies active suicidal ideation at this time, stating, "not right now," but acknowledges that suicidal thoughts could return if discharged due to ongoing stressors related to his living situation. The patient resides with his parents and reports significant stress in caring for his father, who has dementia, despite some assistance from his brother. He endorses feeling overwhelmed by his responsibilities, which contribute to episodes of "very high" anxiety when thinking about these stressors. Currently, his anxiety is on the low end.  The patient was noted to be resting calmly in bed with no signs of acute distress. No hallucinations, paranoia, or  substance use were reported. Of note, the patient carries a diagnosis of C. difficile infection along with multiple other medical comorbidities. Psychiatry will continue to follow due to his past suicide attempts, including one as recently as February of this year.  There have been no significant changes in his clinical presentation  since yesterday. The patient does not appear aware of a possible neoplasm diagnosis at this time. Psychiatry will remain signed on for supportive care. The patient does not currently appear to be an acute danger to himself.  Psych ROS:  Depression: Positive  Anxiety:  Positive  Mania (lifetime and current): Denies Psychosis: (lifetime and current): Denies   Review of Systems  Gastrointestinal:  Positive for abdominal pain and diarrhea.  Psychiatric/Behavioral:  Positive for depression. The patient is nervous/anxious.   All other systems reviewed and are negative.    Psychiatric and Social History  Psychiatric History:  Information collected from patient and chart review  Prev Dx/Sx: MDD, GAD Current Psych Provider: None  Home Meds (current): Effexor , Remeron , hydroxyzine  Previous Med Trials: Unknown  Therapy: None   Prior Psych Hospitalization: Yes  Prior Self Harm: Yes Prior Violence: Yes  Family Psych History: Patient grandmother had paranoid schizophrenia Family Hx suicide: Unknown   Social History:  Developmental Hx: Patient appears age appropriate  Educational Hx: Graduated high school Occupational Hx: Unemployed Legal Hx: Denies  Living Situation: lives with his parents Spiritual Hx: Unknown  Access to weapons/lethal means: Denies   Substance History Alcohol: No, not since 1997 Type of alcohol No  Last Drink No  Number of drinks per day No  History of alcohol withdrawal seizures No  History of DT's Denies  Tobacco: Denies Yes Illicit drugs: No Prescription drug abuse: Denies Rehab hx: Yes  Exam Findings  Physical Exam:  Vital Signs:  Temp:  [97.7 F (36.5 C)-98.2 F (36.8 C)] 97.7 F (36.5 C) (05/05 0602) Pulse Rate:  [77-95] 77 (05/05 0602) Resp:  [18] 18 (05/05 0602) BP: (136-178)/(75-91) 178/85 (05/05 0602) SpO2:  [98 %-99 %] 99 % (05/05 0602) Blood pressure (!) 178/85, pulse 77, temperature 97.7 F (36.5 C), temperature source Oral, resp.  rate 18, height 6\' 1"  (1.854 m), weight 74.3 kg, SpO2 99%. Body mass index is 21.61 kg/m.  Physical Exam Vitals and nursing note reviewed.  Constitutional:      Appearance: Normal appearance.  HENT:     Head: Normocephalic.  Pulmonary:     Effort: Pulmonary effort is normal.  Neurological:     General: No focal deficit present.     Mental Status: He is alert and oriented to person, place, and time.  Psychiatric:        Attention and Perception: Attention normal.        Mood and Affect: Mood is anxious and depressed. Affect is flat.        Speech: Speech normal.        Behavior: Behavior is cooperative.        Thought Content: Thought content normal.        Cognition and Memory: Cognition normal.        Judgment: Judgment is inappropriate.     Mental Status Exam: General Appearance: Casual  Orientation:  Full (Time, Place, and Person)  Memory:  Fair  Concentration:  Concentration: Poor and Attention Span: Fair  Recall:  Fair  Attention  Fair  Eye Contact:  Good  Speech:  Clear and Coherent  Language:  Good  Volume:  WDL  Mood: having  a panic attack  Affect:  Blunt  Thought Process:  Coherent  Thought Content:  Denies  "not right now  Suicidal Thoughts:  Denies  Homicidal Thoughts:  No  Judgement:  Fair to poor  Insight:  Fair  Psychomotor Activity:  Decreased  Akathisia:  No  Fund of Knowledge:  Fair      Assets:  Manufacturing systems engineer Desire for Improvement Social Support  Cognition:  WNL  ADL's:  Intact  AIMS (if indicated):        Other History   These have been pulled in through the EMR, reviewed, and updated if appropriate.  Family History:  The patient's family history includes Allergies in his father; Breast cancer in his mother; Clotting disorder in his father; Heart disease in his father; Stomach cancer in his mother; Thyroid  cancer in his father.  Medical History: Past Medical History:  Diagnosis Date   Allergy    Anal fistula    Anxiety     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 06/15/2018   Cancer of skin of neck    Cataract    Chronic mesenteric ischemia ([name])    Chronic pain syndrome 07/09/2016   Crohn's disease of small and large intestines ([name])    followed by dr Gorman Laughter gessner   Dairy product intolerance    Diarrhea, functional    Family history of adverse reaction to anesthesia    Grover's disease    transient acantholytic dermatosis   History of alcohol abuse    History of basal cell carcinoma excision    2013 left leg   History of Clostridium difficile    10/ 2014   History of multiple concussions    x6   last one Jan 2017 per pt--  no residual   History of substance abuse ([name])    quit 1997 per pt   History of suicide attempt    05-18-2012  overdose /  failure to thrive   Iron  deficiency anemia due to chronic blood loss    Major depression, recurrent, chronic ([name])    Osteopenia    Personal history of adenomatous colonic polyps 12/2010, 03/2012   12/2010 - 8 mm serrated adenoma of rectum   Portal vein thrombosis 03/21/2015   right   Primary sclerosing cholangitis    ? 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   Substance abuse ([name]) 1997   Alcohol   Vitamin A  deficiency 05/23/2018   Vitamin B6 deficiency 07/27/2018   Vitamin C deficiency 05/23/2018    Surgical History: Past Surgical History:  Procedure Laterality Date   ABDOMINAL AORTAGRAM N/A 03/29/2012   Procedure: ABDOMINAL Tommi Fraise;  Surgeon: Margherita Shell, MD;  Location: [facility] CATH LAB;  Service: Cardiovascular;  Laterality: N/A;   ADENOIDECTOMY  age 70   BIOPSY  05/31/2018   Procedure: BIOPSY;  Surgeon: Janel Medford, MD;  Location: [facility] ENDOSCOPY;  Service: Endoscopy;;   CATARACT EXTRACTION W/ INTRAOCULAR LENS  IMPLANT,  BILATERAL  2009   COLONOSCOPY  2001, 05/02/2003, 01/28/11   2012: Right colon Crohn's, rectal polyp   COLONOSCOPY  03/31/2012   Procedure: COLONOSCOPY;  Surgeon: Nannette Babe, MD;  Location: [facility] ENDOSCOPY;  Service: Gastroenterology;  Laterality: N/A;   COLONOSCOPY WITH PROPOFOL  N/A 05/31/2018  Procedure: COLONOSCOPY WITH PROPOFOL ;  Surgeon: Janel Medford, MD;  Location: [facility] ENDOSCOPY;  Service: Endoscopy;  Laterality: N/A;   ESOPHAGOGASTRODUODENOSCOPY  01/28/2011   Normal   FOOT SURGERY Right age 14   MOHS SURGERY Left 11/2013   left ankle parakerotosis    PERCUTANEOUS LIVER BIOPSY  2007 and 2008   PILONIDAL CYST EXCISION  age 42   PLACEMENT OF SETON N/A 11/06/2015   Procedure: PLACEMENT OF SETON;  Surgeon: Joyce Nixon, MD;  Location: [facility];  Service: General;  Laterality: N/A;   PLACEMENT OF SETON  07/2018   at [facility]   RECTAL EXAM UNDER ANESTHESIA N/A 01/18/2019   Procedure: ANAL EXAM UNDER ANESTHESIA,  INCISION AND DRAINAGE, SETON PLACEMENT;  Surgeon: Joyce Nixon, MD;  Location: [facility];  Service: General;  Laterality: N/A;   UPPER GASTROINTESTINAL ENDOSCOPY       Medications:   Current Facility-Administered Medications:    0.9 %  sodium chloride  infusion, , Intravenous, Continuous, Alekh, Kshitiz, MD, Last Rate: 75 mL/hr at 07/03/23 1914, New Bag at 07/03/23 1914   acetaminophen  (TYLENOL ) tablet 650 mg, 650 mg, Oral, Q6H PRN **OR** acetaminophen  (TYLENOL ) suppository 650 mg, 650 mg, Rectal, Q6H PRN, Danice Dural, MD   enoxaparin  (LOVENOX ) injection 40 mg, 40 mg, Subcutaneous, Q24H, Danice Dural, MD, 40 mg at 07/03/23 2141   hydrOXYzine  (ATARAX ) tablet 25 mg, 25 mg, Oral, TID PRN, Danice Dural, MD, 25 mg at 07/03/23 2130   mirtazapine  (REMERON ) tablet 15 mg, 15 mg, Oral, QHS, Lord, Jamison Y, NP, 15 mg at 07/03/23 2130   ondansetron  (ZOFRAN ) tablet 4 mg, 4 mg, Oral, Q6H PRN, 4 mg at 07/02/23 2037 **OR** ondansetron   (ZOFRAN ) injection 4 mg, 4 mg, Intravenous, Q6H PRN, Danice Dural, MD   predniSONE  (DELTASONE ) tablet 30 mg, 30 mg, Oral, Q breakfast, Danice Dural, MD, 30 mg at 07/04/23 0626   protein supplement (ENSURE MAX) liquid, 237 mL, Oral, BID, Danice Dural, MD, 237 mL at 07/03/23 2130   vancomycin  (VANCOCIN ) capsule 125 mg, 125 mg, Oral, QID, Alekh, Kshitiz, MD, 125 mg at 07/03/23 2130   venlafaxine  XR (EFFEXOR -XR) 24 hr capsule 150 mg, 150 mg, Oral, Daily, Danice Dural, MD, 150 mg at 07/03/23 9485   venlafaxine  XR (EFFEXOR -XR) 24 hr capsule 75 mg, 75 mg, Oral, Daily, Danice Dural, MD, 75 mg at 07/03/23 4627  Allergies: Allergies  Allergen Reactions   Tape Itching, Rash and Other (See Comments)    Please use "paper" tape   Asacol  [Mesalamine ] Diarrhea and Other (See Comments)    Abd pain, too   Azathioprine Diarrhea and Other (See Comments)    Abd pain, too   Gluten Meal Diarrhea   Metoprolol  Tartrate Nausea Only and Other (See Comments)    Stomach upset   Mycophenolate Mofetil Diarrhea and Other (See Comments)    Abd pain, too   Other Diarrhea and Other (See Comments)    NUTS   Wheat Diarrhea and Other (See Comments)    Constipation; flatulence; abd pain (can tolerate "sometimes")   Abilify  [Aripiprazole ] Diarrhea   Amoxicillin  Other (See Comments)    Lots of gas    Rella Cardinal, FNP

## 2023-07-04 NOTE — Progress Notes (Signed)
 PROGRESS NOTE    [name]  ZOX:[id number] DOB: [birth year] DOA: 07/02/2023 PCP: Jimmey Mould, MD   Brief Narrative:  58 year old male with history of an fistula, anxiety, osteoarthritis, autoimmune hepatitis, chronic mesenteric ischemia, chronic pain syndrome, Crohn's disease, C. difficile colitis, depression, portal vein thrombosis, primary sclerosing cholangitis presented with multiple episodes of diarrhea with dizziness and fall and near syncope.  On presentation, potassium was 3, creatinine of 1.41.  CT of abdomen and pelvis showed possible colitis and noncystic lesion in the liver, MRI recommended.  He was started on antibiotics.  GI was consulted.  He also had suicidal ideation: Psychiatry consulted.  Assessment & Plan:   C. difficile colitis -Patient presented with diarrhea and imaging showed possible colitis.  Initially started on broad-spectrum antibiotics with Rocephin and Flagyl .  Tested positive for C. difficile.  DC'd Rocephin and Flagyl .  Started oral vancomycin  on 07/03/2023: Continue for 10 to 14 days.  History of Crohn's disease with concern for exacerbation with history of perianal fistula - GI evaluation pending.  Currently on prednisone .  On Entyvio  infusion as an outpatient.  New liver lesion - MRI of abdomen is suggestive of neoplasm, cholangiocarcinoma would be a distinct concern.  Follow GI recommendations.  Leukocytosis -Improving.  Monitor  Thrombocytopenia -Questionable cause.  No signs of bleeding.  Monitor  Hypokalemia - Replace.  Repeat a.m. labs  Essential hypertension - Blood pressure stable.  Antihypertensives as needed  Protein calorie malnutrition, severe - Consult nutrition  PTSD MDD Suicidal ideation - Continue mirtazapine  and venlafaxine  XR along with hydroxyzine  as needed.  [employer].  Psychiatry following and recommended outpatient psychiatric hospitalization once medically stable.  DVT prophylaxis: Lovenox  Code  Status: Full Family Communication: None at bedside Disposition Plan: Status is: Inpatient Remains inpatient appropriate because: Of severity of illness    Consultants: GI/psychiatry  Procedures: None  Antimicrobials:  Anti-infectives (From admission, onward)    Start     Dose/Rate Route Frequency Ordered Stop   07/03/23 1000  vancomycin  (VANCOCIN ) capsule 125 mg        125 mg Oral 4 times daily 07/03/23 0812 07/13/23 0959   07/02/23 1830  cefTRIAXone (ROCEPHIN) 2 g in sodium chloride  0.9 % 100 mL IVPB  Status:  Discontinued        2 g 200 mL/hr over 30 Minutes Intravenous Every 24 hours 07/02/23 1742 07/03/23 0812   07/02/23 1830  metroNIDAZOLE  (FLAGYL ) IVPB 500 mg  Status:  Discontinued        500 mg 100 mL/hr over 60 Minutes Intravenous Every 12 hours 07/02/23 1742 07/03/23 8119        Subjective: Patient seen and examined at bedside.  Still having intermittent abdominal pain and diarrhea but diarrhea is improving.  No fever or vomiting reported. Objective: Vitals:   07/03/23 0453 07/03/23 1409 07/03/23 1939 07/04/23 0602  BP: 113/65 136/75 (!) 158/91 (!) 178/85  Pulse: 69 95 84 77  Resp: 16 18 18 18   Temp: 98.1 F (36.7 C) 98.1 F (36.7 C) 98.2 F (36.8 C) 97.7 F (36.5 C)  TempSrc: Oral Oral  Oral  SpO2: 99% 99% 98% 99%  Weight:      Height:        Intake/Output Summary (Last 24 hours) at 07/04/2023 0746 Last data filed at 07/04/2023 0500 Gross per 24 hour  Intake 3731.65 ml  Output 3251 ml  Net 480.65 ml   Filed Weights   07/02/23 1031 07/03/23 0402  Weight: 74.8 kg 74.3 kg  Examination:  General: On room air.  No distress ENT/neck: No thyromegaly.  JVD is not elevated  respiratory: Decreased breath sounds at bases bilaterally with some crackles; no wheezing  CVS: S1-S2 heard, rate controlled currently Abdominal: Soft, nontender, slightly distended; no organomegaly, bowel sounds are heard Extremities: Trace lower extremity edema; no cyanosis   CNS: Awake and alert.  Remains slow to respond and a very poor historian.  No focal neurologic deficit.  Moves extremities Lymph: No obvious lymphadenopathy Skin: No obvious ecchymosis/lesions  psych: Mostly flat affect.  Not agitated currently. musculoskeletal: No obvious joint swelling/deformity     Data Reviewed: I have personally reviewed following labs and imaging studies  CBC: Recent Labs  Lab 07/02/23 1049 07/03/23 0700 07/04/23 0510  WBC 27.4* 20.0* 12.2*  NEUTROABS 23.8*  --  9.8*  HGB 15.1 13.7 14.6  HCT 45.3 41.7 44.8  MCV 96.0 97.7 97.6  PLT 120* 112* 122*   Basic Metabolic Panel: Recent Labs  Lab 07/02/23 1049 07/03/23 0700 07/04/23 0510  NA 136 144 142  K 3.0* 3.5 3.4*  CL 102 116* 110  CO2 24 22 27   GLUCOSE 82 64* 79  BUN 25* 16 16  CREATININE 1.41* 1.02 0.99  CALCIUM 7.5* 7.3* 8.1*  MG  --  1.9 2.1  PHOS  --  1.9*  --    GFR: Estimated Creatinine Clearance: 83.4 mL/min (by C-G formula based on SCr of 0.99 mg/dL). Liver Function Tests: Recent Labs  Lab 07/02/23 1049 07/03/23 0700 07/04/23 0510  AST 37 28 45*  ALT 88* 66* 84*  ALKPHOS 140* 113 157*  BILITOT 0.9 0.6 0.6  PROT 5.0* 4.5* 5.3*  ALBUMIN 2.4* 2.1* 2.5*   No results for input(s): "LIPASE", "AMYLASE" in the last 168 hours. No results for input(s): "AMMONIA" in the last 168 hours. Coagulation Profile: No results for input(s): "INR", "PROTIME" in the last 168 hours. Cardiac Enzymes: No results for input(s): "CKTOTAL", "CKMB", "CKMBINDEX", "TROPONINI" in the last 168 hours. BNP (last 3 results) No results for input(s): "PROBNP" in the last 8760 hours. HbA1C: No results for input(s): "HGBA1C" in the last 72 hours. CBG: No results for input(s): "GLUCAP" in the last 168 hours. Lipid Profile: No results for input(s): "CHOL", "HDL", "LDLCALC", "TRIG", "CHOLHDL", "LDLDIRECT" in the last 72 hours. Thyroid  Function Tests: No results for input(s): "TSH", "T4TOTAL", "FREET4",  "T3FREE", "THYROIDAB" in the last 72 hours. Anemia Panel: No results for input(s): "VITAMINB12", "FOLATE", "FERRITIN", "TIBC", "IRON ", "RETICCTPCT" in the last 72 hours. Sepsis Labs: No results for input(s): "PROCALCITON", "LATICACIDVEN" in the last 168 hours.  Recent Results (from the past 240 hours)  Culture, blood (Routine X 2) w Reflex to ID Panel     Status: None (Preliminary result)   Collection Time: 07/02/23  6:24 PM   Specimen: BLOOD RIGHT ARM  Result Value Ref Range Status   Specimen Description BLOOD RIGHT ARM  Final   Special Requests   Final    Blood Culture results may not be optimal due to an inadequate volume of blood received in culture bottles AEROBIC BOTTLE ONLY   Culture   Final    NO GROWTH < 12 HOURS Performed at [facility], [address], [place], [place] [address]    Report Status PENDING  Incomplete  Culture, blood (Routine X 2) w Reflex to ID Panel     Status: None (Preliminary result)   Collection Time: 07/02/23  6:24 PM   Specimen: BLOOD RIGHT ARM  Result  Value Ref Range Status   Specimen Description BLOOD RIGHT ARM  Final   Special Requests   Final    Blood Culture results may not be optimal due to an inadequate volume of blood received in culture bottles AEROBIC BOTTLE ONLY   Culture   Final    NO GROWTH < 12 HOURS Performed at [facility], [address], [place], [place] [address]    Report Status PENDING  Incomplete  Gastrointestinal Panel by PCR , Stool     Status: Abnormal   Collection Time: 07/02/23  7:47 PM   Specimen: STOOL  Result Value Ref Range Status   Campylobacter species NOT DETECTED NOT DETECTED Final   Plesimonas shigelloides NOT DETECTED NOT DETECTED Final   Salmonella species NOT DETECTED NOT DETECTED Final   Yersinia enterocolitica NOT DETECTED NOT DETECTED Final   Vibrio species NOT DETECTED NOT DETECTED Final   Vibrio cholerae NOT DETECTED NOT DETECTED Final   Enteroaggregative E coli (EAEC) NOT DETECTED NOT  DETECTED Final   Enteropathogenic E coli (EPEC) NOT DETECTED NOT DETECTED Final   Enterotoxigenic E coli (ETEC) NOT DETECTED NOT DETECTED Final   Shiga like toxin producing E coli (STEC) NOT DETECTED NOT DETECTED Final   Shigella/Enteroinvasive E coli (EIEC) NOT DETECTED NOT DETECTED Final   Cryptosporidium NOT DETECTED NOT DETECTED Final   Cyclospora cayetanensis NOT DETECTED NOT DETECTED Final   Entamoeba histolytica NOT DETECTED NOT DETECTED Final   Giardia lamblia NOT DETECTED NOT DETECTED Final   Adenovirus F40/41 NOT DETECTED NOT DETECTED Final   Astrovirus NOT DETECTED NOT DETECTED Final   Norovirus GI/GII DETECTED (A) NOT DETECTED Final    Comment: CRITICAL RESULT CALLED TO, READ BACK BY AND VERIFIED WITH: Lelia Putnam RN @ [phone] 07/03/23 [facility]    Rotavirus A NOT DETECTED NOT DETECTED Final   Sapovirus (I, II, IV, and V) NOT DETECTED NOT DETECTED Final    Comment: Performed at [facility], [address] [place], [place] [address]  C Difficile Quick Screen w PCR reflex     Status: Abnormal   Collection Time: 07/02/23  7:47 PM   Specimen: STOOL  Result Value Ref Range Status   C Diff antigen POSITIVE (A) NEGATIVE Final   C Diff toxin NEGATIVE NEGATIVE Final   C Diff interpretation Results are indeterminate. See PCR results.  Final    Comment: Performed at [facility], [address], [place], [place] [address]  C. Diff by PCR, Reflexed     Status: Abnormal   Collection Time: 07/02/23  7:47 PM  Result Value Ref Range Status   Toxigenic C. Difficile by PCR POSITIVE (A) NEGATIVE Final    Comment: Positive for toxigenic C. difficile with little to no toxin production. Only treat if clinical presentation suggests symptomatic illness. Performed at [facility], [address], [place], [place] [address]          Radiology Studies: ECHOCARDIOGRAM COMPLETE Result Date: 07/03/2023    ECHOCARDIOGRAM REPORT   Patient Name:   DELMUS SCHLENDER Date of  Exam: 07/03/2023 Medical Rec #:  [id number]           Height:       73.0 in Accession #:    [id number]          Weight:       163.8 lb Date of Birth:  07-02-1965           BSA:  1.977 m Patient Age:    58 years            BP:           113/65 mmHg Patient Gender: M                   HR:           81 bpm. Exam Location:  Inpatient Procedure: 2D Echo, Cardiac Doppler and Color Doppler (Both Spectral and Color            Flow Doppler were utilized during procedure). Indications:    R55 Syncope  History:        Patient has no prior history of Echocardiogram examinations.                 Signs/Symptoms:Syncope; Risk Factors:Hypertension and                 Dyslipidemia. ETOH. H/O substance abuse.  Sonographer:    Raynelle Callow RDCS Referring Phys: [id number] DAVID MANUEL ORTIZ  Sonographer Comments: Technically difficult study due to poor echo windows. IMPRESSIONS  1. Left ventricular ejection fraction, by estimation, is 70 to 75%. The left ventricle has hyperdynamic function. The left ventricle has no regional wall motion abnormalities. There is mild asymmetric left ventricular hypertrophy of the basal-septal segment. Left ventricular diastolic parameters are consistent with Grade I diastolic dysfunction (impaired relaxation).  2. Right ventricular systolic function is normal. The right ventricular size is normal. Tricuspid regurgitation signal is inadequate for assessing PA pressure.  3. The mitral valve is normal in structure. No evidence of mitral valve regurgitation. No evidence of mitral stenosis.  4. The aortic valve was not well visualized. There is mild calcification of the aortic valve. There is mild thickening of the aortic valve. Aortic valve regurgitation is not visualized. No aortic stenosis is present.  5. The inferior vena cava is normal in size with greater than 50% respiratory variability, suggesting right atrial pressure of 3 mmHg. FINDINGS  Left Ventricle: Left ventricular ejection fraction, by  estimation, is 70 to 75%. The left ventricle has hyperdynamic function. The left ventricle has no regional wall motion abnormalities. The left ventricular internal cavity size was normal in size. There is mild asymmetric left ventricular hypertrophy of the basal-septal segment. Left ventricular diastolic parameters are consistent with Grade I diastolic dysfunction (impaired relaxation). Normal left ventricular filling pressure. Right Ventricle: The right ventricular size is normal. Right vetricular wall thickness was not well visualized. Right ventricular systolic function is normal. Tricuspid regurgitation signal is inadequate for assessing PA pressure. Left Atrium: Left atrial size was normal in size. Right Atrium: Right atrial size was normal in size. Pericardium: There is no evidence of pericardial effusion. Mitral Valve: The mitral valve is normal in structure. No evidence of mitral valve regurgitation. No evidence of mitral valve stenosis. Tricuspid Valve: The tricuspid valve is normal in structure. Tricuspid valve regurgitation is not demonstrated. No evidence of tricuspid stenosis. Aortic Valve: The aortic valve was not well visualized. There is mild calcification of the aortic valve. There is mild thickening of the aortic valve. There is mild aortic valve annular calcification. Aortic valve regurgitation is not visualized. No aortic stenosis is present. Aortic valve mean gradient measures 8.1 mmHg. Aortic valve peak gradient measures 18.2 mmHg. Aortic valve area, by VTI measures 2.49 cm. Pulmonic Valve: The pulmonic valve was not well visualized. Pulmonic valve regurgitation is not visualized. No evidence of pulmonic stenosis. Aorta: The aortic root and  ascending aorta are structurally normal, with no evidence of dilitation. Venous: The inferior vena cava is normal in size with greater than 50% respiratory variability, suggesting right atrial pressure of 3 mmHg. IAS/Shunts: The interatrial septum was not  well visualized.  LEFT VENTRICLE PLAX 2D LVIDd:         4.20 cm     Diastology LVIDs:         3.00 cm     LV e' medial:    9.36 cm/s LV PW:         1.00 cm     LV E/e' medial:  7.2 LV IVS:        1.20 cm     LV e' lateral:   11.00 cm/s LVOT diam:     2.50 cm     LV E/e' lateral: 6.1 LV SV:         87 LV SV Index:   44 LVOT Area:     4.91 cm  LV Volumes (MOD) LV vol d, MOD A2C: 84.5 ml LV vol d, MOD A4C: 82.6 ml LV vol s, MOD A2C: 29.8 ml LV vol s, MOD A4C: 28.1 ml LV SV MOD A2C:     54.7 ml LV SV MOD A4C:     82.6 ml LV SV MOD BP:      56.9 ml RIGHT VENTRICLE             IVC RV S prime:     19.00 cm/s  IVC diam: 2.00 cm TAPSE (M-mode): 2.3 cm LEFT ATRIUM             Index        RIGHT ATRIUM          Index LA diam:        4.20 cm 2.12 cm/m   RA Area:     9.61 cm LA Vol (A2C):   54.9 ml 27.78 ml/m  RA Volume:   13.10 ml 6.63 ml/m LA Vol (A4C):   45.4 ml 22.97 ml/m LA Biplane Vol: 52.7 ml 26.66 ml/m  AORTIC VALVE AV Area (Vmax):    2.25 cm AV Area (Vmean):   2.41 cm AV Area (VTI):     2.49 cm AV Vmax:           213.11 cm/s AV Vmean:          132.009 cm/s AV VTI:            0.349 m AV Peak Grad:      18.2 mmHg AV Mean Grad:      8.1 mmHg LVOT Vmax:         97.70 cm/s LVOT Vmean:        64.900 cm/s LVOT VTI:          0.177 m LVOT/AV VTI ratio: 0.51  AORTA Ao Root diam: 3.00 cm Ao Asc diam:  3.30 cm MITRAL VALVE MV Area (PHT): 3.65 cm     SHUNTS MV Decel Time: 208 msec     Systemic VTI:  0.18 m MV E velocity: 67.40 cm/s   Systemic Diam: 2.50 cm MV A velocity: 105.00 cm/s MV E/A ratio:  0.64 Armida Lander MD Electronically signed by Armida Lander MD Signature Date/Time: 07/03/2023/1:53:38 PM    Final    MR ABDOMEN W WO CONTRAST Result Date: 07/03/2023 CLINICAL DATA:  Primary sclerosing cholangitis. New 3 cm liver lesion. EXAM: MRI ABDOMEN WITHOUT AND WITH CONTRAST TECHNIQUE: Multiplanar multisequence MR imaging of the abdomen was  performed both before and after the administration of intravenous contrast.  CONTRAST:  7mL GADAVIST  GADOBUTROL  1 MMOL/ML IV SOLN COMPARISON:  Abdomen and pelvis CT 07/02/2023. MRI abdomen 10/31/2021 FINDINGS: Lower chest: Insert lower chest Hepatobiliary: As noted on prior CT, there is atrophy or prior resection in the right hepatic lobe. Lesion in question on CT scan yesterday measures approximately 3.7 x 2.8 x 4.3 cm in the lateral aspect of the hepatic dome and shows heterogeneous enhancement after IV contrast administration. Immediately inferior and posterior to this lesion is a cyst measuring 5.0 x 4.5 cm, increased from 4.2 x 4.3 cm on previous MRI. Between the new heterogeneously enhancing lesion and the dominant cyst is a 1.3 x 1.1 cm cystic lesion (11/7) that is similar to previous MRI. Gallbladder demonstrates distorted anatomy but is otherwise unremarkable. Although not a dedicated MRCP exam, as on previous MR bile duct show areas of variable dilatation in stenosis compatible with the reported clinical history of primary sclerosing cholangitis. Common bile duct in the head of the pancreas is nondilated. Pancreas: No focal mass lesion. No dilatation of the main duct. No intraparenchymal cyst. No peripancreatic edema. Spleen:  No splenomegaly. No suspicious focal mass lesion. Adrenals/Urinary Tract: No adrenal nodule or mass. Kidneys unremarkable. Stomach/Bowel: Stomach is unremarkable. No gastric wall thickening. No evidence of outlet obstruction. Duodenum is normally positioned as is the ligament of Treitz. No small bowel or colonic dilatation within the visualized abdomen. Vascular/Lymphatic: No abdominal aortic aneurysm. No abdominal lymphadenopathy. Other:  No substantial intraperitoneal free fluid. Musculoskeletal: No focal suspicious marrow enhancement within the visualized bony anatomy. IMPRESSION: 1. Lesion in question on CT scan yesterday measures approximately 3.7 x 2.8 x 4.3 cm in the lateral aspect of the hepatic dome. Lesion shows heterogeneous enhancement after IV  contrast administration, suspicious for neoplasm. Given the history of primary sclerosing cholangitis, cholangiocarcinoma would be a distinct concern. Tissue sampling recommended. 2. Immediately inferior and posterior to this lesion is a cyst measuring 5.0 x 4.5 cm, increased from 4.2 x 4.3 cm on previous MRI. 3. Although not a dedicated MRCP exam, as on previous MR, the bile ducts show areas of variable dilatation and stenosis compatible with the reported clinical history of primary sclerosing cholangitis. Electronically Signed   By: Donnal Fusi M.D.   On: 07/03/2023 12:35   CT ABDOMEN PELVIS WO CONTRAST Result Date: 07/02/2023 CLINICAL DATA:  Abdominal pain. Nonlocalized. History of primary sclerosing cholangitis. Stomach is unremarkable. No gastric wall thickening. No evidence of outlet obstruction. Duodenum is normally positioned as is the ligament of Treitz. EXAM: CT ABDOMEN AND PELVIS WITHOUT CONTRAST TECHNIQUE: Multidetector CT imaging of the abdomen and pelvis was performed following the standard protocol without IV contrast. RADIATION DOSE REDUCTION: This exam was performed according to the departmental dose-optimization program which includes automated exposure control, adjustment of the mA and/or kV according to patient size and/or use of iterative reconstruction technique. COMPARISON:  MRI 10/31/2021.  CT 07/20/2018. FINDINGS: Lower chest: Dependent atelectasis in both lung bases. Hepatobiliary: Stable deformity of the liver with atrophy of right hepatic parenchyma. Lobular contour of the left liver is similar. 4.7 cm hepatic cyst was 4.3 cm on previous MRI. There is a new 3.7 x 2.8 cm non cystic lesion in the dome of the liver (19/2) not present on the prior studies. Tiny calcified gallstone evident. No intrahepatic or extrahepatic biliary dilation. Pancreas: No focal mass lesion. No dilatation of the main duct. No intraparenchymal cyst. No peripancreatic edema. Spleen: No splenomegaly.  No  suspicious focal mass lesion. Adrenals/Urinary Tract: No adrenal nodule or mass. Kidneys unremarkable. No evidence for hydroureter. The urinary bladder appears normal for the degree of distention. Stomach/Bowel: Stomach is unremarkable. No gastric wall thickening. No evidence of outlet obstruction. Duodenum is normally positioned as is the ligament of Treitz. No small bowel wall thickening. No small bowel dilatation. The terminal ileum is normal. The appendix is not well visualized, but there is no edema or inflammation in the region of the cecal tip to suggest appendicitis. No gross colonic mass. No colonic wall thickening. Diverticular changes are noted in the left colon. There is mild ill definition of the wall of the descending colon with subtle pericolonic edema in the same region. Vascular/Lymphatic: There is moderate atherosclerotic calcification of the abdominal aorta without aneurysm. There is no gastrohepatic or hepatoduodenal ligament lymphadenopathy. No retroperitoneal or mesenteric lymphadenopathy. No pelvic sidewall lymphadenopathy. Reproductive: The prostate gland and seminal vesicles are unremarkable. Other: No intraperitoneal free fluid. Musculoskeletal: No worrisome lytic or sclerotic osseous abnormality. IMPRESSION: 1. Mild ill definition of the wall of the descending colon with subtle pericolonic edema in the same region. Imaging features suggest colitis. 2. New 3.7 x 2.8 cm non-cystic lesion in the dome of the liver not present on the prior studies. Given the history of primary sclerosing cholangitis, close follow-up and further evaluation warranted to exclude cholangiocarcinoma. MRI abdomen with and without contrast recommended to further evaluate and could be performed on a short term nonemergent outpatient basis. 3. Cholelithiasis. 4.  Aortic Atherosclerosis (ICD10-I70.0). Electronically Signed   By: Donnal Fusi M.D.   On: 07/02/2023 11:45        Scheduled Meds:  enoxaparin   (LOVENOX ) injection  40 mg Subcutaneous Q24H   mirtazapine   15 mg Oral QHS   predniSONE   30 mg Oral Q breakfast   Ensure Max Protein  237 mL Oral BID   vancomycin   125 mg Oral QID   venlafaxine  XR  150 mg Oral Daily   venlafaxine  XR  75 mg Oral Daily   Continuous Infusions:  sodium chloride  75 mL/hr at 07/03/23 1914          Audria Leather, MD [facility] 07/04/2023, 7:46 AM '

## 2023-07-04 NOTE — TOC Initial Note (Signed)
 Transition of Care [facility]) - Initial/Assessment Note    Patient Details  Name: [name] MRN: [id number] Date of Birth: Aug 01, 1965  Transition of Care [facility]) CM/SW Contact:    Tessie Fila, RN Phone Number: 07/04/2023, 11:41 AM  Clinical Narrative:                 Pt from home with parents. Pt helps care for his father with dementia. Pt recently hospitalized with suicidal ideations. Psych has been consulted. PT also consulted. TOC will continue to follow for any new recommendations or needs.  Expected Discharge Plan: Home/Self Care Barriers to Discharge: Continued Medical Work up   Patient Goals and CMS Choice Patient states their goals for this hospitalization and ongoing recovery are:: To return home CMS Medicare.gov Compare Post Acute Care list provided to:: Other (Comment Required) Choice offered to / list presented to : NA [place] ownership interest in [facility].provided to:: Parent NA    Expected Discharge Plan and Services In-house Referral: NA Discharge Planning Services: CM Consult Post Acute Care Choice: NA Living arrangements for the past 2 months: Single Family Home                 DME Arranged: N/A DME Agency: NA       HH Arranged: NA HH Agency: NA        Prior Living Arrangements/Services Living arrangements for the past 2 months: Single Family Home Lives with:: Relatives Patient language and need for interpreter reviewed:: Yes Do you feel safe going back to the place where you live?: Yes      Need for Family Participation in Patient Care: Yes (Comment) Care giver support system in place?: Yes (comment)   Criminal Activity/Legal Involvement Pertinent to Current Situation/Hospitalization: No - Comment as needed  Activities of Daily Living   ADL Screening (condition at time of admission) Independently performs ADLs?: Yes (appropriate for developmental age) Is the patient deaf or have difficulty hearing?: No Does the patient  have difficulty seeing, even when wearing glasses/contacts?: No Does the patient have difficulty concentrating, remembering, or making decisions?: Yes  Permission Sought/Granted Permission sought to share information with : Family Supports Permission granted to share information with : Yes, Release of Information Signed  Share Information with NAME: Saiquan, Yurek (Father)  [phone]           Emotional Assessment Appearance:: Appears stated age Attitude/Demeanor/Rapport: Unable to Assess Affect (typically observed): Unable to Assess Orientation: : Oriented to Self, Oriented to Place, Oriented to  Time, Oriented to Situation Alcohol / Substance Use: Not Applicable Psych Involvement: Yes (comment) (Psych consulted)  Admission diagnosis:  Colitis [K52.9] Exacerbation of Crohn's disease ([name]) [K50.90] Patient Active Problem List   Diagnosis Date Noted   Abnormal finding on imaging of liver 07/03/2023   PSC (primary sclerosing cholangitis) 07/03/2023   Major depressive disorder, recurrent episode, moderate ([name]) 07/03/2023   Exacerbation of Crohn's disease ([name]) 07/02/2023   AKI (acute kidney injury) ([name]) 07/02/2023   Protein-calorie malnutrition, severe ([name]) 07/02/2023   Transaminitis 07/02/2023   Thrombocytopenia ([name]) 07/02/2023   Near syncope 07/02/2023   Leukocytosis 07/02/2023   Suicidal ideation 07/02/2023   Tremor 04/14/2023   MDD (major depressive disorder) 04/13/2023   Crohn's disease ([name]) 03/29/2023   Steroid-induced diabetes ([name]) 03/15/2022   Encounter for long-term use of opiate analgesic 02/08/2021   Iron  deficiency anemia 08/11/2020   Current mild episode of major depressive disorder ([name]) 08/28/2019   GAD (generalized anxiety disorder) 08/28/2019  PTSD (post-traumatic stress disorder) 08/14/2019   Vitamin B6 deficiency 07/27/2018   Vitamin E deficiency 07/27/2018   Dilated intrahepatic bile ducts    Vitamin C deficiency 05/23/2018   Vitamin A   deficiency 05/23/2018   Grover's disease 11/04/2017   Chronic pain syndrome 07/09/2016   Perirectal fistula and abscess 05/18/2016   Insomnia 11/29/2014   Long-term use of immunosuppressant medication - Entyvio  11/28/2014   Posterior Anal fissure 10/03/2014   Anemia due to chronic blood loss 02/11/2014   Autoimmune hepatitis-PSC overlap 01/19/2013   Rash on trunk 12/31/2012   Enteritis due to Clostridium difficile 10/23/2012   Arthralgia 08/28/2012   Hypokalemia 05/18/2012   MDD (major depressive disorder), recurrent severe, without psychosis ([name]) 04/12/2012   Dehydration 03/25/2012   History of colonic polyps 01/18/2012   Osteopenia 02/17/2011   Long term current use of systemic steroids 08/11/2010   Essential hypertension 08/09/2008   Vitamin D  deficiency 09/26/2007   Hyperlipidemia 09/26/2007   CROHN'S DISEASE, LARGE AND SMALL INTESTINES 03/28/2007   PCP:  Jimmey Mould, MD Pharmacy:   [facility] [phone] - [place], [place] - [address] [place] [address] Phone: [phone] Fax: [phone]  [id number] - [place], [place] - [address] [facility] [address] [place] [address] Phone: [phone] Fax: [phone]  [facility] [place], [place] - [address] [place] [place] [address] Phone: [phone] Fax: [phone]  [facility] [place], [place] - [address] [address] Phone: [phone] Fax: [phone]  [facility] - [place], [place] - [address] [place] [address] Phone: [phone] Fax: [phone]  [facility] [address] [place] [address] Phone: [phone] Fax: [phone]     Social Drivers of Health (SDOH) Social  History: SDOH Screenings   Food Insecurity: No Food Insecurity (07/02/2023)  Recent Concern: Food Insecurity - Food Insecurity Present (04/13/2023)  Housing: Low Risk  (07/02/2023)  Recent Concern: Housing - High Risk (04/13/2023)  Transportation Needs: No Transportation Needs (07/02/2023)  Utilities: Not At Risk (07/02/2023)  Alcohol Screen: Low Risk  (04/30/2023)  Depression (PHQ2-9): Medium Risk (06/06/2020)  Financial Resource Strain: Low Risk  (08/28/2019)  Physical Activity: Insufficiently Active (11/08/2019)  Social Connections: Socially Isolated (07/02/2023)  Stress: Stress Concern Present (08/28/2019)  Tobacco Use: Medium Risk (07/02/2023)   SDOH Interventions:     Readmission Risk Interventions    07/04/2023   11:30 AM  Readmission Risk Prevention Plan  Transportation Screening Complete  PCP or Specialist Appt within 3-5 Days Complete  HRI or Home Care Consult Complete  Social Work Consult for Recovery Care Planning/Counseling Complete  Palliative Care Screening Complete  Medication Review Oceanographer) Complete

## 2023-07-04 NOTE — Progress Notes (Signed)
 [facility] Liaison Note  07/04/2023  [name] 1965-08-22 [id number]  Location: RN Hospital Liaison screened the patient remotely at [facility].  Insurance: Medicare   [name] is a 58 y.o. male who is a Primary Care Patient of [facility], [place], MD The patient was screened for readmission hospitalization with noted high risk score for unplanned readmission risk with 3 IP/4 ED in 6 months.  The patient was assessed for potential Care Management service needs for post hospital transition for care coordination. Review of patient's electronic medical record reveals patient was admitted for exacerbation of Crohn's Disease. No VBCI needs to address at this time. Pt followed heavy by Behavior Health.   Plan: [facility] Liaison will continue to follow progress and disposition to asess for post hospital community care coordination/management needs.  Referral request for community care coordination: pending disposition.   VBCI Care Management/Population Health does not replace or interfere with any arrangements made by the Inpatient Transition of Care team.   For questions contact:   Lilla Reichert, RN, BSN Hospital Liaison [place]   [facility], Population Health Office Hours MTWF  8:00 am-6:00 pm Direct Dial: [phone] mobile [email or web address]

## 2023-07-04 NOTE — Progress Notes (Signed)
 Patient ID: [name], male   DOB: [birth year], 58 y.o.   MRN: [id number] Request received for liver lesion biopsy on patient.  Latest imaging studies were reviewed by Dr. Nereida Banning.  Following discussion with patient today he does not want to proceed with liver biopsy at this time.  Ordering team notified.  Please contact IR [phone]  if patient decides to proceed.

## 2023-07-04 NOTE — Progress Notes (Addendum)
 [facility] Progress Note  CC: Crohn's disease, abdominal pain  Subjective: I attempted to see the patient earlier this morning but he stated he was having a panic attack and asked that I come back later. Seen by psychiatry NP as I was leaving his room. I returned this afternoon and he was calm and agreeable.  He denies have any nausea or vomiting.  No abdominal pain.  He passed 2 nonbloody loose stools overnight and 2 similar diarrhea bowel movements this morning.  However, he sometimes sees a few spots of bright red blood on the toilet tissue which has occurred since he had a perianal abscess drained in office by colorectal surgery PA-C 4/7.  No chest pain or shortness of breath. Sitter at the doorway.   Objective:  Vital signs in last 24 hours: Temp:  [97.7 F (36.5 C)-98.2 F (36.8 C)] 97.7 F (36.5 C) (05/05 0602) Pulse Rate:  [77-95] 77 (05/05 0602) Resp:  [18] 18 (05/05 0602) BP: (136-178)/(75-91) 178/85 (05/05 0602) SpO2:  [98 %-99 %] 99 % (05/05 0602) Last BM Date : 07/03/23 General: 58 year old male in no acute distress. Heart: Regular rate and rhythm, no murmurs. Pulm: Breath sounds clear throughout. Abdomen: Soft, nondistended.  Nontender.  Positive bowel sounds to all 4 quadrants. Extremities: No edema. Neurologic:  Alert and  oriented x 4. Grossly normal neurologically. Psych:  Alert and cooperative. Normal mood and affect.  Calm at this time.  Intake/Output from previous day: 05/04 0701 - 05/05 0700 In: 3731.7 [P.O.:720; I.V.:3011.7] Out: 4051 [Urine:4050; Stool:1] Intake/Output this shift: Total I/O In: -  Out: 1001 [Urine:1000; Stool:1]  Lab Results: Recent Labs    07/02/23 1049 07/03/23 0700 07/04/23 0510  WBC 27.4* 20.0* 12.2*  HGB 15.1 13.7 14.6  HCT 45.3 41.7 44.8  PLT 120* 112* 122*   BMET Recent Labs    07/02/23 1049 07/03/23 0700 07/04/23 0510  NA 136 144 142  K 3.0* 3.5 3.4*  CL 102 116* 110  CO2 24 22 27   GLUCOSE  82 64* 79  BUN 25* 16 16  CREATININE 1.41* 1.02 0.99  CALCIUM 7.5* 7.3* 8.1*   LFT Recent Labs    07/04/23 0510  PROT 5.3*  ALBUMIN 2.5*  AST 45*  ALT 84*  ALKPHOS 157*  BILITOT 0.6   PT/INR No results for input(s): "LABPROT", "INR" in the last 72 hours. Hepatitis Panel No results for input(s): "HEPBSAG", "HCVAB", "HEPAIGM", "HEPBIGM" in the last 72 hours.  ECHOCARDIOGRAM COMPLETE Result Date: 07/03/2023    ECHOCARDIOGRAM REPORT   Patient Name:   [name] Date of Exam: 07/03/2023 Medical Rec #:  [id number]           Height:       73.0 in Accession #:    [id number]          Weight:       163.8 lb Date of Birth:  [birth year]           BSA:          1.977 m Patient Age:    58 years            BP:           113/65 mmHg Patient Gender: M                   HR:           81 bpm. Exam Location:  Inpatient Procedure: 2D Echo, Cardiac Doppler and  Color Doppler (Both Spectral and Color            Flow Doppler were utilized during procedure). Indications:    R55 Syncope  History:        Patient has no prior history of Echocardiogram examinations.                 Signs/Symptoms:Syncope; Risk Factors:Hypertension and                 Dyslipidemia. ETOH. H/O substance abuse.  Sonographer:    Raynelle Callow RDCS Referring Phys: [id number] DAVID MANUEL ORTIZ  Sonographer Comments: Technically difficult study due to poor echo windows. IMPRESSIONS  1. Left ventricular ejection fraction, by estimation, is 70 to 75%. The left ventricle has hyperdynamic function. The left ventricle has no regional wall motion abnormalities. There is mild asymmetric left ventricular hypertrophy of the basal-septal segment. Left ventricular diastolic parameters are consistent with Grade I diastolic dysfunction (impaired relaxation).  2. Right ventricular systolic function is normal. The right ventricular size is normal. Tricuspid regurgitation signal is inadequate for assessing PA pressure.  3. The mitral valve is normal in structure. No  evidence of mitral valve regurgitation. No evidence of mitral stenosis.  4. The aortic valve was not well visualized. There is mild calcification of the aortic valve. There is mild thickening of the aortic valve. Aortic valve regurgitation is not visualized. No aortic stenosis is present.  5. The inferior vena cava is normal in size with greater than 50% respiratory variability, suggesting right atrial pressure of 3 mmHg. FINDINGS  Left Ventricle: Left ventricular ejection fraction, by estimation, is 70 to 75%. The left ventricle has hyperdynamic function. The left ventricle has no regional wall motion abnormalities. The left ventricular internal cavity size was normal in size. There is mild asymmetric left ventricular hypertrophy of the basal-septal segment. Left ventricular diastolic parameters are consistent with Grade I diastolic dysfunction (impaired relaxation). Normal left ventricular filling pressure. Right Ventricle: The right ventricular size is normal. Right vetricular wall thickness was not well visualized. Right ventricular systolic function is normal. Tricuspid regurgitation signal is inadequate for assessing PA pressure. Left Atrium: Left atrial size was normal in size. Right Atrium: Right atrial size was normal in size. Pericardium: There is no evidence of pericardial effusion. Mitral Valve: The mitral valve is normal in structure. No evidence of mitral valve regurgitation. No evidence of mitral valve stenosis. Tricuspid Valve: The tricuspid valve is normal in structure. Tricuspid valve regurgitation is not demonstrated. No evidence of tricuspid stenosis. Aortic Valve: The aortic valve was not well visualized. There is mild calcification of the aortic valve. There is mild thickening of the aortic valve. There is mild aortic valve annular calcification. Aortic valve regurgitation is not visualized. No aortic stenosis is present. Aortic valve mean gradient measures 8.1 mmHg. Aortic valve peak gradient  measures 18.2 mmHg. Aortic valve area, by VTI measures 2.49 cm. Pulmonic Valve: The pulmonic valve was not well visualized. Pulmonic valve regurgitation is not visualized. No evidence of pulmonic stenosis. Aorta: The aortic root and ascending aorta are structurally normal, with no evidence of dilitation. Venous: The inferior vena cava is normal in size with greater than 50% respiratory variability, suggesting right atrial pressure of 3 mmHg. IAS/Shunts: The interatrial septum was not well visualized.  LEFT VENTRICLE PLAX 2D LVIDd:         4.20 cm     Diastology LVIDs:         3.00 cm  LV e' medial:    9.36 cm/s LV PW:         1.00 cm     LV E/e' medial:  7.2 LV IVS:        1.20 cm     LV e' lateral:   11.00 cm/s LVOT diam:     2.50 cm     LV E/e' lateral: 6.1 LV SV:         87 LV SV Index:   44 LVOT Area:     4.91 cm  LV Volumes (MOD) LV vol d, MOD A2C: 84.5 ml LV vol d, MOD A4C: 82.6 ml LV vol s, MOD A2C: 29.8 ml LV vol s, MOD A4C: 28.1 ml LV SV MOD A2C:     54.7 ml LV SV MOD A4C:     82.6 ml LV SV MOD BP:      56.9 ml RIGHT VENTRICLE             IVC RV S prime:     19.00 cm/s  IVC diam: 2.00 cm TAPSE (M-mode): 2.3 cm LEFT ATRIUM             Index        RIGHT ATRIUM          Index LA diam:        4.20 cm 2.12 cm/m   RA Area:     9.61 cm LA Vol (A2C):   54.9 ml 27.78 ml/m  RA Volume:   13.10 ml 6.63 ml/m LA Vol (A4C):   45.4 ml 22.97 ml/m LA Biplane Vol: 52.7 ml 26.66 ml/m  AORTIC VALVE AV Area (Vmax):    2.25 cm AV Area (Vmean):   2.41 cm AV Area (VTI):     2.49 cm AV Vmax:           213.11 cm/s AV Vmean:          132.009 cm/s AV VTI:            0.349 m AV Peak Grad:      18.2 mmHg AV Mean Grad:      8.1 mmHg LVOT Vmax:         97.70 cm/s LVOT Vmean:        64.900 cm/s LVOT VTI:          0.177 m LVOT/AV VTI ratio: 0.51  AORTA Ao Root diam: 3.00 cm Ao Asc diam:  3.30 cm MITRAL VALVE MV Area (PHT): 3.65 cm     SHUNTS MV Decel Time: 208 msec     Systemic VTI:  0.18 m MV E velocity: 67.40 cm/s    Systemic Diam: 2.50 cm MV A velocity: 105.00 cm/s MV E/A ratio:  0.64 Armida Lander MD Electronically signed by Armida Lander MD Signature Date/Time: 07/03/2023/1:53:38 PM    Final    MR ABDOMEN W WO CONTRAST Result Date: 07/03/2023 CLINICAL DATA:  Primary sclerosing cholangitis. New 3 cm liver lesion. EXAM: MRI ABDOMEN WITHOUT AND WITH CONTRAST TECHNIQUE: Multiplanar multisequence MR imaging of the abdomen was performed both before and after the administration of intravenous contrast. CONTRAST:  7mL GADAVIST  GADOBUTROL  1 MMOL/ML IV SOLN COMPARISON:  Abdomen and pelvis CT 07/02/2023. MRI abdomen 10/31/2021 FINDINGS: Lower chest: Insert lower chest Hepatobiliary: As noted on prior CT, there is atrophy or prior resection in the right hepatic lobe. Lesion in question on CT scan yesterday measures approximately 3.7 x 2.8 x 4.3 cm in the lateral aspect of the hepatic dome and  shows heterogeneous enhancement after IV contrast administration. Immediately inferior and posterior to this lesion is a cyst measuring 5.0 x 4.5 cm, increased from 4.2 x 4.3 cm on previous MRI. Between the new heterogeneously enhancing lesion and the dominant cyst is a 1.3 x 1.1 cm cystic lesion (11/7) that is similar to previous MRI. Gallbladder demonstrates distorted anatomy but is otherwise unremarkable. Although not a dedicated MRCP exam, as on previous MR bile duct show areas of variable dilatation in stenosis compatible with the reported clinical history of primary sclerosing cholangitis. Common bile duct in the head of the pancreas is nondilated. Pancreas: No focal mass lesion. No dilatation of the main duct. No intraparenchymal cyst. No peripancreatic edema. Spleen:  No splenomegaly. No suspicious focal mass lesion. Adrenals/Urinary Tract: No adrenal nodule or mass. Kidneys unremarkable. Stomach/Bowel: Stomach is unremarkable. No gastric wall thickening. No evidence of outlet obstruction. Duodenum is normally positioned as is the  ligament of Treitz. No small bowel or colonic dilatation within the visualized abdomen. Vascular/Lymphatic: No abdominal aortic aneurysm. No abdominal lymphadenopathy. Other:  No substantial intraperitoneal free fluid. Musculoskeletal: No focal suspicious marrow enhancement within the visualized bony anatomy. IMPRESSION: 1. Lesion in question on CT scan yesterday measures approximately 3.7 x 2.8 x 4.3 cm in the lateral aspect of the hepatic dome. Lesion shows heterogeneous enhancement after IV contrast administration, suspicious for neoplasm. Given the history of primary sclerosing cholangitis, cholangiocarcinoma would be a distinct concern. Tissue sampling recommended. 2. Immediately inferior and posterior to this lesion is a cyst measuring 5.0 x 4.5 cm, increased from 4.2 x 4.3 cm on previous MRI. 3. Although not a dedicated MRCP exam, as on previous MR, the bile ducts show areas of variable dilatation and stenosis compatible with the reported clinical history of primary sclerosing cholangitis. Electronically Signed   By: Donnal Fusi M.D.   On: 07/03/2023 12:35   CT ABDOMEN PELVIS WO CONTRAST Result Date: 07/02/2023 CLINICAL DATA:  Abdominal pain. Nonlocalized. History of primary sclerosing cholangitis. Stomach is unremarkable. No gastric wall thickening. No evidence of outlet obstruction. Duodenum is normally positioned as is the ligament of Treitz. EXAM: CT ABDOMEN AND PELVIS WITHOUT CONTRAST TECHNIQUE: Multidetector CT imaging of the abdomen and pelvis was performed following the standard protocol without IV contrast. RADIATION DOSE REDUCTION: This exam was performed according to the departmental dose-optimization program which includes automated exposure control, adjustment of the mA and/or kV according to patient size and/or use of iterative reconstruction technique. COMPARISON:  MRI 10/31/2021.  CT 07/20/2018. FINDINGS: Lower chest: Dependent atelectasis in both lung bases. Hepatobiliary: Stable  deformity of the liver with atrophy of right hepatic parenchyma. Lobular contour of the left liver is similar. 4.7 cm hepatic cyst was 4.3 cm on previous MRI. There is a new 3.7 x 2.8 cm non cystic lesion in the dome of the liver (19/2) not present on the prior studies. Tiny calcified gallstone evident. No intrahepatic or extrahepatic biliary dilation. Pancreas: No focal mass lesion. No dilatation of the main duct. No intraparenchymal cyst. No peripancreatic edema. Spleen: No splenomegaly. No suspicious focal mass lesion. Adrenals/Urinary Tract: No adrenal nodule or mass. Kidneys unremarkable. No evidence for hydroureter. The urinary bladder appears normal for the degree of distention. Stomach/Bowel: Stomach is unremarkable. No gastric wall thickening. No evidence of outlet obstruction. Duodenum is normally positioned as is the ligament of Treitz. No small bowel wall thickening. No small bowel dilatation. The terminal ileum is normal. The appendix is not well visualized, but there is no edema  or inflammation in the region of the cecal tip to suggest appendicitis. No gross colonic mass. No colonic wall thickening. Diverticular changes are noted in the left colon. There is mild ill definition of the wall of the descending colon with subtle pericolonic edema in the same region. Vascular/Lymphatic: There is moderate atherosclerotic calcification of the abdominal aorta without aneurysm. There is no gastrohepatic or hepatoduodenal ligament lymphadenopathy. No retroperitoneal or mesenteric lymphadenopathy. No pelvic sidewall lymphadenopathy. Reproductive: The prostate gland and seminal vesicles are unremarkable. Other: No intraperitoneal free fluid. Musculoskeletal: No worrisome lytic or sclerotic osseous abnormality. IMPRESSION: 1. Mild ill definition of the wall of the descending colon with subtle pericolonic edema in the same region. Imaging features suggest colitis. 2. New 3.7 x 2.8 cm non-cystic lesion in the dome of  the liver not present on the prior studies. Given the history of primary sclerosing cholangitis, close follow-up and further evaluation warranted to exclude cholangiocarcinoma. MRI abdomen with and without contrast recommended to further evaluate and could be performed on a short term nonemergent outpatient basis. 3. Cholelithiasis. 4.  Aortic Atherosclerosis (ICD10-I70.0). Electronically Signed   By: Donnal Fusi M.D.   On: 07/02/2023 11:45    Patient Profile: 58 year old male with history of anxiety, depression, past suicide attempt, osteoarthritis, portal vein thrombosis, chronic mesenteric ischemia, chronic pain syndrome, Crohn's disease, perianal fistula, C. difficile colitis, primary sclerosing cholangitis. Autoimmune hepatitis and cirrhosis admitted with multiple episodes of diarrhea with dizziness, fall and near syncope.   Assessment / Plan:  Crohn's disease of the large/small intestine and perianal disease on Entyvio  admitted 07/02/2023 with suspected Crohn's flare, abdominal pain and diarrhea with near syncope. CTAP without contrast 5/3 showed mild ill definition of the wall of the descending colon with subtle pericolonic edema in the same region. Imaging features suggest colitis. Initially started on Rocephin and Flagyl  IV which was discontinued after he tested positive for C. difficile. -IV fluids and pain management per the hospitalist  -Continue Entyvio  infusions as an outpatient, last infusion was a few weeks ago -Diet as tolerated   C. difficile colitis, recently on oral Doxycycline  for a perianal abscess. On Vancomycin  125 mg 4 times daily started on 07/03/2023. - Continue Vancomycin  4 times daily x 14 days  Autoimmune hepatitis with primary sclerosing cholangitis overlap with cirrhosis on chronic Prednisone . T. Bili 0.6. Alk phos 157. AST 45. ALT 84.  History of alcohol use disorder  New liver lesion seen on CT. MRI identified a 3.7 x 2.8 x 4.3 cm liver lesion in the lateral aspect  of the hepatic dome concerning for [name] and a 5.0 x 4.5 cm cystic lesion. T. Bili 0.6. Alk phos 157. AST 45. ALT 84. AFP and CA 19-9 pending. - Consider IR consult for biopsy, to discuss further with Dr. Venice Gillis  Leukocytosis secondary to chronic Prednisone  use  Thrombocytopenia. PLT 122.     Principal Problem:   Exacerbation of Crohn's disease ([name]) Active Problems:   Hyperlipidemia   Essential hypertension   Dehydration   Hypokalemia   Enteritis due to Clostridium difficile   PTSD (post-traumatic stress disorder)   MDD (major depressive disorder)   Tremor   AKI (acute kidney injury) ([name])   Protein-calorie malnutrition, severe ([name])   Transaminitis   Thrombocytopenia ([name])   Near syncope   Leukocytosis   Suicidal ideation   Abnormal finding on imaging of liver   PSC (primary sclerosing cholangitis)   Major depressive disorder, recurrent episode, moderate ([name])     LOS: 2 days  Tory Freiberg  07/04/2023, 1:51PM   Attending physician's note   I have taken history, reviewed the chart and examined the patient. I performed a substantive portion of this encounter, including complete performance of at least one of the key components, in conjunction with the APP. I agree with the Advanced Practitioner's note, impression and recommendations.   Got signout from the Dr. Cherryl Corona.  Crohn's disease perianal modifiers and PSC/?AIH on chronic pred and Entyvio ,  Admitted with acute onset diarrhea, found to have positive C. difficile, started vancomycin  5/4-diarrhea has resolved.   CT incidentally noted new 3 cm hepatic lesion.  MRI showed lesion highly suspicious for malignancy.   Had panic attack today.   Plan: - Follow CA 19-9, CEA. - IR liver biopsy in AM (if OK from psych standpoint). - Check PT INR in AM - Continue vancomycin  x 14 days.   Magnus Schuller, MD [facility] [phone]

## 2023-07-05 ENCOUNTER — Inpatient Hospital Stay (HOSPITAL_COMMUNITY)

## 2023-07-05 ENCOUNTER — Other Ambulatory Visit (HOSPITAL_COMMUNITY): Payer: Self-pay

## 2023-07-05 DIAGNOSIS — K5090 Crohn's disease, unspecified, without complications: Secondary | ICD-10-CM | POA: Diagnosis not present

## 2023-07-05 DIAGNOSIS — K754 Autoimmune hepatitis: Secondary | ICD-10-CM | POA: Diagnosis not present

## 2023-07-05 DIAGNOSIS — F331 Major depressive disorder, recurrent, moderate: Secondary | ICD-10-CM | POA: Diagnosis not present

## 2023-07-05 DIAGNOSIS — K769 Liver disease, unspecified: Secondary | ICD-10-CM | POA: Diagnosis not present

## 2023-07-05 DIAGNOSIS — K50814 Crohn's disease of both small and large intestine with abscess: Secondary | ICD-10-CM | POA: Diagnosis not present

## 2023-07-05 DIAGNOSIS — A0472 Enterocolitis due to Clostridium difficile, not specified as recurrent: Secondary | ICD-10-CM | POA: Diagnosis not present

## 2023-07-05 LAB — CBC WITH DIFFERENTIAL/PLATELET
Abs Immature Granulocytes: 0.2 10*3/uL — ABNORMAL HIGH (ref 0.00–0.07)
Basophils Absolute: 0.1 10*3/uL (ref 0.0–0.1)
Basophils Relative: 1 %
Eosinophils Absolute: 0.1 10*3/uL (ref 0.0–0.5)
Eosinophils Relative: 1 %
HCT: 48.4 % (ref 39.0–52.0)
Hemoglobin: 16 g/dL (ref 13.0–17.0)
Immature Granulocytes: 2 %
Lymphocytes Relative: 13 %
Lymphs Abs: 1.2 10*3/uL (ref 0.7–4.0)
MCH: 31.9 pg (ref 26.0–34.0)
MCHC: 33.1 g/dL (ref 30.0–36.0)
MCV: 96.6 fL (ref 80.0–100.0)
Monocytes Absolute: 1 10*3/uL (ref 0.1–1.0)
Monocytes Relative: 11 %
Neutro Abs: 6.6 10*3/uL (ref 1.7–7.7)
Neutrophils Relative %: 72 %
Platelets: 145 10*3/uL — ABNORMAL LOW (ref 150–400)
RBC: 5.01 MIL/uL (ref 4.22–5.81)
RDW: 13.2 % (ref 11.5–15.5)
WBC: 9.1 10*3/uL (ref 4.0–10.5)
nRBC: 0 % (ref 0.0–0.2)

## 2023-07-05 LAB — COMPREHENSIVE METABOLIC PANEL WITH GFR
ALT: 79 U/L — ABNORMAL HIGH (ref 0–44)
AST: 40 U/L (ref 15–41)
Albumin: 2.7 g/dL — ABNORMAL LOW (ref 3.5–5.0)
Alkaline Phosphatase: 162 U/L — ABNORMAL HIGH (ref 38–126)
Anion gap: 10 (ref 5–15)
BUN: 12 mg/dL (ref 6–20)
CO2: 25 mmol/L (ref 22–32)
Calcium: 8.4 mg/dL — ABNORMAL LOW (ref 8.9–10.3)
Chloride: 107 mmol/L (ref 98–111)
Creatinine, Ser: 0.93 mg/dL (ref 0.61–1.24)
GFR, Estimated: >60 mL/min (ref >60)
Glucose, Bld: 68 mg/dL — ABNORMAL LOW (ref 70–99)
Potassium: 3.8 mmol/L (ref 3.5–5.1)
Sodium: 142 mmol/L (ref 135–145)
Total Bilirubin: 0.9 mg/dL (ref 0.0–1.2)
Total Protein: 5.6 g/dL — ABNORMAL LOW (ref 6.5–8.1)

## 2023-07-05 LAB — MAGNESIUM: Magnesium: 2 mg/dL (ref 1.7–2.4)

## 2023-07-05 MED ORDER — VANCOMYCIN HCL 125 MG PO CAPS
125.0000 mg | ORAL_CAPSULE | Freq: Four times a day (QID) | ORAL | 0 refills | Status: AC
  Filled 2023-07-05: qty 48, 12d supply, fill #0

## 2023-07-05 MED ORDER — MIRTAZAPINE 30 MG PO TABS: 15.0000 mg | ORAL_TABLET | Freq: Every day | ORAL | Status: AC

## 2023-07-05 MED ORDER — HYDRALAZINE HCL 25 MG PO TABS: 25.0000 mg | ORAL_TABLET | Freq: Four times a day (QID) | ORAL | Status: DC | PRN

## 2023-07-05 MED ORDER — BUSPIRONE HCL 5 MG PO TABS
7.5000 mg | ORAL_TABLET | Freq: Three times a day (TID) | ORAL | Status: DC
  Administered 2023-07-05: 7.5 mg via ORAL
  Filled 2023-07-05: qty 2

## 2023-07-05 MED ORDER — BUSPIRONE HCL 7.5 MG PO TABS
7.5000 mg | ORAL_TABLET | Freq: Three times a day (TID) | ORAL | 0 refills | Status: AC
  Filled 2023-07-05: qty 90, 30d supply, fill #0

## 2023-07-05 NOTE — Plan of Care (Signed)

## 2023-07-05 NOTE — Discharge Summary (Signed)
 Physician Discharge Summary  [name] GNF:[id number] DOB: [birth year] DOA: 07/02/2023  PCP: Jimmey Mould, MD  Admit date: 07/02/2023 Discharge date: 07/05/2023  Admitted From: Home Disposition: Home  Recommendations for Outpatient Follow-up:  Follow up with PCP in 1 week with repeat CBC/CMP Outpatient follow-up with GI and psychiatry Follow up in ED if symptoms worsen or new appear   Home Health: No Equipment/Devices: None  Discharge Condition: Stable CODE STATUS: Full Diet recommendation: Heart healthy  Brief/Interim Summary: 58 year old male with history of an fistula, anxiety, osteoarthritis, autoimmune hepatitis, chronic mesenteric ischemia, chronic pain syndrome, Crohn's disease, C. difficile colitis, depression, portal vein thrombosis, primary sclerosing cholangitis presented with multiple episodes of diarrhea with dizziness and fall and near syncope. On presentation, potassium was 3, creatinine of 1.41. CT of abdomen and pelvis showed possible colitis and noncystic lesion in the liver, MRI recommended. He was started on antibiotics. GI was consulted. He also had suicidal ideation: Psychiatry consulted.  During the hospitalization, his condition has improved.  He is not having any more suicidal ideation: Psychiatry has cleared him for discharge; he has been started on BuSpar  as well by psychiatry.  He tested positive for C. difficile and antibiotics switched to oral vancomycin .  GI evaluated the patient.  MRI of abdomen was concerning for cholangiocarcinoma.  Patient refused liver biopsy despite multiple efforts of convincing him.  He wants to go home today.  GI has cleared him for discharge.  Discharge patient home today with outpatient follow-up with PCP/GI/psychiatry.  Discharge Diagnoses:   C. difficile colitis -Patient presented with diarrhea and imaging showed possible colitis.  Initially started on broad-spectrum antibiotics with Rocephin and Flagyl .  Tested  positive for C. difficile.  DC'd Rocephin and Flagyl .  Started oral vancomycin  on 07/03/2023: Continue for total of 14 days as per GI recommendations.  Diarrhea has improved.   History of Crohn's disease with concern for exacerbation with history of perianal fistula - GI following.  Currently on prednisone .  On Entyvio  infusion as an outpatient.  Outpatient follow-up with GI.   New liver lesion - MRI of abdomen is suggestive of neoplasm, cholangiocarcinoma would be a distinct concern.   - GI recommended liver biopsy by IR.  Patient refused liver biopsy yesterday. Today, he initially was willing to get the liver biopsy done but refused it again.   -He wants to go home today.  GI has cleared him for discharge.  Discharge patient home today with outpatient follow-up with GI.  Leukocytosis - Resolved   Thrombocytopenia -Questionable cause.  No signs of bleeding.  Monitor   Hypokalemia - Improved    essential hypertension - Blood pressure intermittently elevated.  outpatient follow-up with PCP in case patient needs to be started on antihypertensives   protein calorie malnutrition, severe - Follow nutrition recommendations.   PTSD MDD Suicidal ideation - Continue mirtazapine  (reduced dose as per psychiatry) and venlafaxine  XR along with hydroxyzine  as needed.  BuSpar  added by psychiatry.  Psychiatry following: Psychiatry has cleared him for discharge.  Patient not having suicidal ideation anymore.  Discharge Instructions  Discharge Instructions     Ambulatory referral to Gastroenterology   Complete by: As directed    Hospital followup   What is the reason for referral?: Other   Diet general   Complete by: As directed    Increase activity slowly   Complete by: As directed       Allergies as of 07/05/2023       Reactions   Tape  Itching, Rash, Other (See Comments)   Please use "paper" tape   Asacol  [mesalamine ] Diarrhea, Other (See Comments)   Abd pain, too   Azathioprine  Diarrhea, Other (See Comments)   Abd pain, too   Gluten Meal Diarrhea   Metoprolol  Tartrate Nausea Only, Other (See Comments)   Stomach upset   Mycophenolate Mofetil Diarrhea, Other (See Comments)   Abd pain, too   Other Diarrhea, Other (See Comments)   NUTS   Wheat Diarrhea, Other (See Comments)   Constipation; flatulence; abd pain (can tolerate "sometimes")   Abilify  [aripiprazole ] Diarrhea   Amoxicillin  Other (See Comments)   Lots of gas        Medication List     STOP taking these medications    doxycycline  100 MG tablet Commonly known as: VIBRA -TABS   Gerhardt's butt cream Crea   Na Sulfate-K Sulfate-Mg Sulfate concentrate 17.5-3.13-1.6 GM/177ML Soln Commonly known as: SUPREP   neomycin -bacitracin -polymyxin [phone] Oint       TAKE these medications    Alaway 0.035 % ophthalmic solution Generic drug: ketotifen Place 1 drop into both eyes 2 (two) times daily as needed (for irritation).   busPIRone  7.5 MG tablet Commonly known as: BUSPAR  Take 1 tablet (7.5 mg total) by mouth 3 (three) times daily.   Ensure High Protein Liqd Take 237 mLs by mouth See admin instructions. Drink 237 ml's of CHOCOLATE HP Ensure by mouth 2 times a day   ENTYVIO  IV Inject into the vein every 6 (six) weeks.   hydrOXYzine  25 MG tablet Commonly known as: ATARAX  Take 1 tablet (25 mg total) by mouth 3 (three) times daily as needed for anxiety. What changed:  when to take this additional instructions   LORazepam  1 MG tablet Commonly known as: ATIVAN  Take 1 tablet (1 mg total) by mouth every 4 (four) hours as needed (tremors, psychomotor agitation).   mirtazapine  30 MG tablet Commonly known as: REMERON  Take 0.5 tablets (15 mg total) by mouth at bedtime. What changed: how much to take   predniSONE  20 MG tablet Commonly known as: DELTASONE  TAKE 1.5 TABLETS (30 MG TOTAL) BY MOUTH DAILY WITH BREAKFAST.   vancomycin  125 MG capsule Commonly known as: VANCOCIN  Take 1  capsule (125 mg total) by mouth 4 (four) times daily for 12 days.   venlafaxine  XR 75 MG 24 hr capsule Commonly known as: EFFEXOR -XR Take 75 mg by mouth See admin instructions. Take 75 mg by mouth in the morning with breakfast in conjunction with one 150 mg capsule to equal a total dose of 225 mg What changed: Another medication with the same name was changed. Make sure you understand how and when to take each.   venlafaxine  XR 150 MG 24 hr capsule Commonly known as: EFFEXOR -XR Take 1 capsule (150 mg total) by mouth daily with breakfast. What changed:  when to take this additional instructions        Follow-up Information     Jimmey Mould, MD. Schedule an appointment as soon as possible for a visit in 1 week(s).   Specialty: Family Medicine Contact information: [address] [place] [address] [phone]         psychiatrist. Schedule an appointment as soon as possible for a visit in 1 week(s).                 Allergies  Allergen Reactions   Tape Itching, Rash and Other (See Comments)    Please use "paper" tape   Asacol  [Mesalamine ] Diarrhea  and Other (See Comments)    Abd pain, too   Azathioprine Diarrhea and Other (See Comments)    Abd pain, too   Gluten Meal Diarrhea   Metoprolol  Tartrate Nausea Only and Other (See Comments)    Stomach upset   Mycophenolate Mofetil Diarrhea and Other (See Comments)    Abd pain, too   Other Diarrhea and Other (See Comments)    NUTS   Wheat Diarrhea and Other (See Comments)    Constipation; flatulence; abd pain (can tolerate "sometimes")   Abilify  [Aripiprazole ] Diarrhea   Amoxicillin  Other (See Comments)    Lots of gas    Consultations: GI/psychiatry/IR   Procedures/Studies: ECHOCARDIOGRAM COMPLETE Result Date: 07/03/2023    ECHOCARDIOGRAM REPORT   Patient Name:   [name] Date of Exam: 07/03/2023 Medical Rec #:  [id number]           Height:       73.0 in Accession #:    [id number]           Weight:       163.8 lb Date of Birth:  [birth year]           BSA:          1.977 m Patient Age:    58 years            BP:           113/65 mmHg Patient Gender: M                   HR:           81 bpm. Exam Location:  Inpatient Procedure: 2D Echo, Cardiac Doppler and Color Doppler (Both Spectral and Color            Flow Doppler were utilized during procedure). Indications:    R55 Syncope  History:        Patient has no prior history of Echocardiogram examinations.                 Signs/Symptoms:Syncope; Risk Factors:Hypertension and                 Dyslipidemia. ETOH. H/O substance abuse.  Sonographer:    Raynelle Callow RDCS Referring Phys: [id number] DAVID MANUEL ORTIZ  Sonographer Comments: Technically difficult study due to poor echo windows. IMPRESSIONS  1. Left ventricular ejection fraction, by estimation, is 70 to 75%. The left ventricle has hyperdynamic function. The left ventricle has no regional wall motion abnormalities. There is mild asymmetric left ventricular hypertrophy of the basal-septal segment. Left ventricular diastolic parameters are consistent with Grade I diastolic dysfunction (impaired relaxation).  2. Right ventricular systolic function is normal. The right ventricular size is normal. Tricuspid regurgitation signal is inadequate for assessing PA pressure.  3. The mitral valve is normal in structure. No evidence of mitral valve regurgitation. No evidence of mitral stenosis.  4. The aortic valve was not well visualized. There is mild calcification of the aortic valve. There is mild thickening of the aortic valve. Aortic valve regurgitation is not visualized. No aortic stenosis is present.  5. The inferior vena cava is normal in size with greater than 50% respiratory variability, suggesting right atrial pressure of 3 mmHg. FINDINGS  Left Ventricle: Left ventricular ejection fraction, by estimation, is 70 to 75%. The left ventricle has hyperdynamic function. The left ventricle has no regional wall  motion abnormalities. The left ventricular internal cavity size was normal in size. There is mild asymmetric left ventricular  hypertrophy of the basal-septal segment. Left ventricular diastolic parameters are consistent with Grade I diastolic dysfunction (impaired relaxation). Normal left ventricular filling pressure. Right Ventricle: The right ventricular size is normal. Right vetricular wall thickness was not well visualized. Right ventricular systolic function is normal. Tricuspid regurgitation signal is inadequate for assessing PA pressure. Left Atrium: Left atrial size was normal in size. Right Atrium: Right atrial size was normal in size. Pericardium: There is no evidence of pericardial effusion. Mitral Valve: The mitral valve is normal in structure. No evidence of mitral valve regurgitation. No evidence of mitral valve stenosis. Tricuspid Valve: The tricuspid valve is normal in structure. Tricuspid valve regurgitation is not demonstrated. No evidence of tricuspid stenosis. Aortic Valve: The aortic valve was not well visualized. There is mild calcification of the aortic valve. There is mild thickening of the aortic valve. There is mild aortic valve annular calcification. Aortic valve regurgitation is not visualized. No aortic stenosis is present. Aortic valve mean gradient measures 8.1 mmHg. Aortic valve peak gradient measures 18.2 mmHg. Aortic valve area, by VTI measures 2.49 cm. Pulmonic Valve: The pulmonic valve was not well visualized. Pulmonic valve regurgitation is not visualized. No evidence of pulmonic stenosis. Aorta: The aortic root and ascending aorta are structurally normal, with no evidence of dilitation. Venous: The inferior vena cava is normal in size with greater than 50% respiratory variability, suggesting right atrial pressure of 3 mmHg. IAS/Shunts: The interatrial septum was not well visualized.  LEFT VENTRICLE PLAX 2D LVIDd:         4.20 cm     Diastology LVIDs:         3.00 cm     LV e'  medial:    9.36 cm/s LV PW:         1.00 cm     LV E/e' medial:  7.2 LV IVS:        1.20 cm     LV e' lateral:   11.00 cm/s LVOT diam:     2.50 cm     LV E/e' lateral: 6.1 LV SV:         87 LV SV Index:   44 LVOT Area:     4.91 cm  LV Volumes (MOD) LV vol d, MOD A2C: 84.5 ml LV vol d, MOD A4C: 82.6 ml LV vol s, MOD A2C: 29.8 ml LV vol s, MOD A4C: 28.1 ml LV SV MOD A2C:     54.7 ml LV SV MOD A4C:     82.6 ml LV SV MOD BP:      56.9 ml RIGHT VENTRICLE             IVC RV S prime:     19.00 cm/s  IVC diam: 2.00 cm TAPSE (M-mode): 2.3 cm LEFT ATRIUM             Index        RIGHT ATRIUM          Index LA diam:        4.20 cm 2.12 cm/m   RA Area:     9.61 cm LA Vol (A2C):   54.9 ml 27.78 ml/m  RA Volume:   13.10 ml 6.63 ml/m LA Vol (A4C):   45.4 ml 22.97 ml/m LA Biplane Vol: 52.7 ml 26.66 ml/m  AORTIC VALVE AV Area (Vmax):    2.25 cm AV Area (Vmean):   2.41 cm AV Area (VTI):     2.49 cm AV Vmax:  213.11 cm/s AV Vmean:          132.009 cm/s AV VTI:            0.349 m AV Peak Grad:      18.2 mmHg AV Mean Grad:      8.1 mmHg LVOT Vmax:         97.70 cm/s LVOT Vmean:        64.900 cm/s LVOT VTI:          0.177 m LVOT/AV VTI ratio: 0.51  AORTA Ao Root diam: 3.00 cm Ao Asc diam:  3.30 cm MITRAL VALVE MV Area (PHT): 3.65 cm     SHUNTS MV Decel Time: 208 msec     Systemic VTI:  0.18 m MV E velocity: 67.40 cm/s   Systemic Diam: 2.50 cm MV A velocity: 105.00 cm/s MV E/A ratio:  0.64 Armida Lander MD Electronically signed by Armida Lander MD Signature Date/Time: 07/03/2023/1:53:38 PM    Final    MR ABDOMEN W WO CONTRAST Result Date: 07/03/2023 CLINICAL DATA:  Primary sclerosing cholangitis. New 3 cm liver lesion. EXAM: MRI ABDOMEN WITHOUT AND WITH CONTRAST TECHNIQUE: Multiplanar multisequence MR imaging of the abdomen was performed both before and after the administration of intravenous contrast. CONTRAST:  7mL GADAVIST  GADOBUTROL  1 MMOL/ML IV SOLN COMPARISON:  Abdomen and pelvis CT 07/02/2023. MRI abdomen  10/31/2021 FINDINGS: Lower chest: Insert lower chest Hepatobiliary: As noted on prior CT, there is atrophy or prior resection in the right hepatic lobe. Lesion in question on CT scan yesterday measures approximately 3.7 x 2.8 x 4.3 cm in the lateral aspect of the hepatic dome and shows heterogeneous enhancement after IV contrast administration. Immediately inferior and posterior to this lesion is a cyst measuring 5.0 x 4.5 cm, increased from 4.2 x 4.3 cm on previous MRI. Between the new heterogeneously enhancing lesion and the dominant cyst is a 1.3 x 1.1 cm cystic lesion (11/7) that is similar to previous MRI. Gallbladder demonstrates distorted anatomy but is otherwise unremarkable. Although not a dedicated MRCP exam, as on previous MR bile duct show areas of variable dilatation in stenosis compatible with the reported clinical history of primary sclerosing cholangitis. Common bile duct in the head of the pancreas is nondilated. Pancreas: No focal mass lesion. No dilatation of the main duct. No intraparenchymal cyst. No peripancreatic edema. Spleen:  No splenomegaly. No suspicious focal mass lesion. Adrenals/Urinary Tract: No adrenal nodule or mass. Kidneys unremarkable. Stomach/Bowel: Stomach is unremarkable. No gastric wall thickening. No evidence of outlet obstruction. Duodenum is normally positioned as is the ligament of Treitz. No small bowel or colonic dilatation within the visualized abdomen. Vascular/Lymphatic: No abdominal aortic aneurysm. No abdominal lymphadenopathy. Other:  No substantial intraperitoneal free fluid. Musculoskeletal: No focal suspicious marrow enhancement within the visualized bony anatomy. IMPRESSION: 1. Lesion in question on CT scan yesterday measures approximately 3.7 x 2.8 x 4.3 cm in the lateral aspect of the hepatic dome. Lesion shows heterogeneous enhancement after IV contrast administration, suspicious for neoplasm. Given the history of primary sclerosing cholangitis,  cholangiocarcinoma would be a distinct concern. Tissue sampling recommended. 2. Immediately inferior and posterior to this lesion is a cyst measuring 5.0 x 4.5 cm, increased from 4.2 x 4.3 cm on previous MRI. 3. Although not a dedicated MRCP exam, as on previous MR, the bile ducts show areas of variable dilatation and stenosis compatible with the reported clinical history of primary sclerosing cholangitis. Electronically Signed   By: Zack Hero.D.  On: 07/03/2023 12:35   CT ABDOMEN PELVIS WO CONTRAST Result Date: 07/02/2023 CLINICAL DATA:  Abdominal pain. Nonlocalized. History of primary sclerosing cholangitis. Stomach is unremarkable. No gastric wall thickening. No evidence of outlet obstruction. Duodenum is normally positioned as is the ligament of Treitz. EXAM: CT ABDOMEN AND PELVIS WITHOUT CONTRAST TECHNIQUE: Multidetector CT imaging of the abdomen and pelvis was performed following the standard protocol without IV contrast. RADIATION DOSE REDUCTION: This exam was performed according to the departmental dose-optimization program which includes automated exposure control, adjustment of the mA and/or kV according to patient size and/or use of iterative reconstruction technique. COMPARISON:  MRI 10/31/2021.  CT 07/20/2018. FINDINGS: Lower chest: Dependent atelectasis in both lung bases. Hepatobiliary: Stable deformity of the liver with atrophy of right hepatic parenchyma. Lobular contour of the left liver is similar. 4.7 cm hepatic cyst was 4.3 cm on previous MRI. There is a new 3.7 x 2.8 cm non cystic lesion in the dome of the liver (19/2) not present on the prior studies. Tiny calcified gallstone evident. No intrahepatic or extrahepatic biliary dilation. Pancreas: No focal mass lesion. No dilatation of the main duct. No intraparenchymal cyst. No peripancreatic edema. Spleen: No splenomegaly. No suspicious focal mass lesion. Adrenals/Urinary Tract: No adrenal nodule or mass. Kidneys unremarkable. No  evidence for hydroureter. The urinary bladder appears normal for the degree of distention. Stomach/Bowel: Stomach is unremarkable. No gastric wall thickening. No evidence of outlet obstruction. Duodenum is normally positioned as is the ligament of Treitz. No small bowel wall thickening. No small bowel dilatation. The terminal ileum is normal. The appendix is not well visualized, but there is no edema or inflammation in the region of the cecal tip to suggest appendicitis. No gross colonic mass. No colonic wall thickening. Diverticular changes are noted in the left colon. There is mild ill definition of the wall of the descending colon with subtle pericolonic edema in the same region. Vascular/Lymphatic: There is moderate atherosclerotic calcification of the abdominal aorta without aneurysm. There is no gastrohepatic or hepatoduodenal ligament lymphadenopathy. No retroperitoneal or mesenteric lymphadenopathy. No pelvic sidewall lymphadenopathy. Reproductive: The prostate gland and seminal vesicles are unremarkable. Other: No intraperitoneal free fluid. Musculoskeletal: No worrisome lytic or sclerotic osseous abnormality. IMPRESSION: 1. Mild ill definition of the wall of the descending colon with subtle pericolonic edema in the same region. Imaging features suggest colitis. 2. New 3.7 x 2.8 cm non-cystic lesion in the dome of the liver not present on the prior studies. Given the history of primary sclerosing cholangitis, close follow-up and further evaluation warranted to exclude cholangiocarcinoma. MRI abdomen with and without contrast recommended to further evaluate and could be performed on a short term nonemergent outpatient basis. 3. Cholelithiasis. 4.  Aortic Atherosclerosis (ICD10-I70.0). Electronically Signed   By: Donnal Fusi M.D.   On: 07/02/2023 11:45      Subjective: Patient seen and examined at bedside.  Poor historian.  Diarrhea is improving.  No fever, seizures, vomiting reported   Discharge  Exam: Vitals:   07/05/23 0411 07/05/23 1220  BP: (!) 167/102 125/89  Pulse: 74 94  Resp: 18   Temp: 97.6 F (36.4 C) (!) 97.5 F (36.4 C)  SpO2: 97% 98%   General: No acute distress currently on room air.  Looks chronically ill and deconditioned.   ENT/neck: No obvious JVD elevation or palpable neck masses noted  respiratory: Mildly decreased breath sounds at bases with scattered crackles  CVS: Most rate controlled; S1 and S2 are heard  abdominal: Soft,  nontender, distended mildly; no organomegaly, bowel sounds are heard normally Extremities: No clubbing; mild lower extremity edema present  CNS: Still very slow to respond, poor historian.  Alert and oriented.  No focal neurologic deficit.  Able to move extremities Lymph: No palpable lymphadenopathy Skin: No obvious petechia/rashes psych: Not agitated.  Flat affect. musculoskeletal: No obvious joint tenderness/erythema    The results of significant diagnostics from this hospitalization (including imaging, microbiology, ancillary and laboratory) are listed below for reference.     Microbiology: Recent Results (from the past 240 hours)  Culture, blood (Routine X 2) w Reflex to ID Panel     Status: None (Preliminary result)   Collection Time: 07/02/23  6:24 PM   Specimen: BLOOD RIGHT ARM  Result Value Ref Range Status   Specimen Description BLOOD RIGHT ARM  Final   Special Requests   Final    Blood Culture results may not be optimal due to an inadequate volume of blood received in culture bottles AEROBIC BOTTLE ONLY   Culture   Final    NO GROWTH 3 DAYS Performed at [facility], [address] [place], [place] [address]    Report Status PENDING  Incomplete  Culture, blood (Routine X 2) w Reflex to ID Panel     Status: None (Preliminary result)   Collection Time: 07/02/23  6:24 PM   Specimen: BLOOD RIGHT ARM  Result Value Ref Range Status   Specimen Description BLOOD RIGHT ARM  Final   Special Requests   Final     Blood Culture results may not be optimal due to an inadequate volume of blood received in culture bottles AEROBIC BOTTLE ONLY   Culture   Final    NO GROWTH 3 DAYS Performed at [facility], [address], [place], [place] [address]    Report Status PENDING  Incomplete  Gastrointestinal Panel by PCR , Stool     Status: Abnormal   Collection Time: 07/02/23  7:47 PM   Specimen: STOOL  Result Value Ref Range Status   Campylobacter species NOT DETECTED NOT DETECTED Final   Plesimonas shigelloides NOT DETECTED NOT DETECTED Final   Salmonella species NOT DETECTED NOT DETECTED Final   Yersinia enterocolitica NOT DETECTED NOT DETECTED Final   Vibrio species NOT DETECTED NOT DETECTED Final   Vibrio cholerae NOT DETECTED NOT DETECTED Final   Enteroaggregative E coli (EAEC) NOT DETECTED NOT DETECTED Final   Enteropathogenic E coli (EPEC) NOT DETECTED NOT DETECTED Final   Enterotoxigenic E coli (ETEC) NOT DETECTED NOT DETECTED Final   Shiga like toxin producing E coli (STEC) NOT DETECTED NOT DETECTED Final   Shigella/Enteroinvasive E coli (EIEC) NOT DETECTED NOT DETECTED Final   Cryptosporidium NOT DETECTED NOT DETECTED Final   Cyclospora cayetanensis NOT DETECTED NOT DETECTED Final   Entamoeba histolytica NOT DETECTED NOT DETECTED Final   Giardia lamblia NOT DETECTED NOT DETECTED Final   Adenovirus F40/41 NOT DETECTED NOT DETECTED Final   Astrovirus NOT DETECTED NOT DETECTED Final   Norovirus GI/GII DETECTED (A) NOT DETECTED Final    Comment: CRITICAL RESULT CALLED TO, READ BACK BY AND VERIFIED WITH: Lelia Putnam RN @ [phone] 07/03/23 [facility]    Rotavirus A NOT DETECTED NOT DETECTED Final   Sapovirus (I, II, IV, and V) NOT DETECTED NOT DETECTED Final    Comment: Performed at [facility], [address] [place], [place] [address]  C Difficile Quick Screen w PCR reflex     Status: Abnormal  Collection Time: 07/02/23  7:47 PM   Specimen: STOOL  Result Value Ref Range Status   C Diff  antigen POSITIVE (A) NEGATIVE Final   C Diff toxin NEGATIVE NEGATIVE Final   C Diff interpretation Results are indeterminate. See PCR results.  Final    Comment: Performed at [facility], [address], [place], [place] [address]  C. Diff by PCR, Reflexed     Status: Abnormal   Collection Time: 07/02/23  7:47 PM  Result Value Ref Range Status   Toxigenic C. Difficile by PCR POSITIVE (A) NEGATIVE Final    Comment: Positive for toxigenic C. difficile with little to no toxin production. Only treat if clinical presentation suggests symptomatic illness. Performed at [facility], [address], [place], [place] [address]      Labs: BNP (last 3 results) No results for input(s): "BNP" in the last 8760 hours. Basic Metabolic Panel: Recent Labs  Lab 07/02/23 1049 07/03/23 0700 07/04/23 0510 07/05/23 0555  NA 136 144 142 142  K 3.0* 3.5 3.4* 3.8  CL 102 116* 110 107  CO2 24 22 27 25   GLUCOSE 82 64* 79 68*  BUN 25* 16 16 12   CREATININE 1.41* 1.02 0.99 0.93  CALCIUM 7.5* 7.3* 8.1* 8.4*  MG  --  1.9 2.1 2.0  PHOS  --  1.9*  --   --    Liver Function Tests: Recent Labs  Lab 07/02/23 1049 07/03/23 0700 07/04/23 0510 07/05/23 0555  AST 37 28 45* 40  ALT 88* 66* 84* 79*  ALKPHOS 140* 113 157* 162*  BILITOT 0.9 0.6 0.6 0.9  PROT 5.0* 4.5* 5.3* 5.6*  ALBUMIN 2.4* 2.1* 2.5* 2.7*   No results for input(s): "LIPASE", "AMYLASE" in the last 168 hours. No results for input(s): "AMMONIA" in the last 168 hours. CBC: Recent Labs  Lab 07/02/23 1049 07/03/23 0700 07/04/23 0510 07/05/23 0555  WBC 27.4* 20.0* 12.2* 9.1  NEUTROABS 23.8*  --  9.8* 6.6  HGB 15.1 13.7 14.6 16.0  HCT 45.3 41.7 44.8 48.4  MCV 96.0 97.7 97.6 96.6  PLT 120* 112* 122* 145*   Cardiac Enzymes: No results for input(s): "CKTOTAL", "CKMB", "CKMBINDEX", "TROPONINI" in the last 168 hours. BNP: Invalid input(s): "POCBNP" CBG: No results for input(s): "GLUCAP" in the last 168  hours. D-Dimer No results for input(s): "DDIMER" in the last 72 hours. Hgb A1c No results for input(s): "HGBA1C" in the last 72 hours. Lipid Profile No results for input(s): "CHOL", "HDL", "LDLCALC", "TRIG", "CHOLHDL", "LDLDIRECT" in the last 72 hours. Thyroid  function studies No results for input(s): "TSH", "T4TOTAL", "T3FREE", "THYROIDAB" in the last 72 hours.  Invalid input(s): "FREET3" Anemia work up No results for input(s): "VITAMINB12", "FOLATE", "FERRITIN", "TIBC", "IRON ", "RETICCTPCT" in the last 72 hours. Urinalysis    Component Value Date/Time   COLORURINE YELLOW 07/02/2023 1501   APPEARANCEUR CLEAR 07/02/2023 1501   LABSPEC 1.009 07/02/2023 1501   PHURINE 5.0 07/02/2023 1501   GLUCOSEU NEGATIVE 07/02/2023 1501   HGBUR SMALL (A) 07/02/2023 1501   BILIRUBINUR NEGATIVE 07/02/2023 1501   KETONESUR NEGATIVE 07/02/2023 1501   PROTEINUR NEGATIVE 07/02/2023 1501   UROBILINOGEN 0.2 03/26/2012 0123   NITRITE NEGATIVE 07/02/2023 1501   LEUKOCYTESUR NEGATIVE 07/02/2023 1501   Sepsis Labs Recent Labs  Lab 07/02/23 1049 07/03/23 0700 07/04/23 0510 07/05/23 0555  WBC 27.4* 20.0* 12.2* 9.1   Microbiology Recent Results (from the past 240 hours)  Culture, blood (Routine X 2) w Reflex to ID  Panel     Status: None (Preliminary result)   Collection Time: 07/02/23  6:24 PM   Specimen: BLOOD RIGHT ARM  Result Value Ref Range Status   Specimen Description BLOOD RIGHT ARM  Final   Special Requests   Final    Blood Culture results may not be optimal due to an inadequate volume of blood received in culture bottles AEROBIC BOTTLE ONLY   Culture   Final    NO GROWTH 3 DAYS Performed at [facility], [address] [place], [place] [address]    Report Status PENDING  Incomplete  Culture, blood (Routine X 2) w Reflex to ID Panel     Status: None (Preliminary result)   Collection Time: 07/02/23  6:24 PM   Specimen: BLOOD RIGHT ARM  Result Value Ref Range Status   Specimen  Description BLOOD RIGHT ARM  Final   Special Requests   Final    Blood Culture results may not be optimal due to an inadequate volume of blood received in culture bottles AEROBIC BOTTLE ONLY   Culture   Final    NO GROWTH 3 DAYS Performed at [facility], [address] [place], [place] [address]    Report Status PENDING  Incomplete  Gastrointestinal Panel by PCR , Stool     Status: Abnormal   Collection Time: 07/02/23  7:47 PM   Specimen: STOOL  Result Value Ref Range Status   Campylobacter species NOT DETECTED NOT DETECTED Final   Plesimonas shigelloides NOT DETECTED NOT DETECTED Final   Salmonella species NOT DETECTED NOT DETECTED Final   Yersinia enterocolitica NOT DETECTED NOT DETECTED Final   Vibrio species NOT DETECTED NOT DETECTED Final   Vibrio cholerae NOT DETECTED NOT DETECTED Final   Enteroaggregative E coli (EAEC) NOT DETECTED NOT DETECTED Final   Enteropathogenic E coli (EPEC) NOT DETECTED NOT DETECTED Final   Enterotoxigenic E coli (ETEC) NOT DETECTED NOT DETECTED Final   Shiga like toxin producing E coli (STEC) NOT DETECTED NOT DETECTED Final   Shigella/Enteroinvasive E coli (EIEC) NOT DETECTED NOT DETECTED Final   Cryptosporidium NOT DETECTED NOT DETECTED Final   Cyclospora cayetanensis NOT DETECTED NOT DETECTED Final   Entamoeba histolytica NOT DETECTED NOT DETECTED Final   Giardia lamblia NOT DETECTED NOT DETECTED Final   Adenovirus F40/41 NOT DETECTED NOT DETECTED Final   Astrovirus NOT DETECTED NOT DETECTED Final   Norovirus GI/GII DETECTED (A) NOT DETECTED Final    Comment: CRITICAL RESULT CALLED TO, READ BACK BY AND VERIFIED WITH: Lelia Putnam RN @ [phone] 07/03/23 [facility]    Rotavirus A NOT DETECTED NOT DETECTED Final   Sapovirus (I, II, IV, and V) NOT DETECTED NOT DETECTED Final    Comment: Performed at [facility], [address] [place], [place] [address]  C Difficile Quick Screen w PCR reflex     Status: Abnormal   Collection Time: 07/02/23  7:47  PM   Specimen: STOOL  Result Value Ref Range Status   C Diff antigen POSITIVE (A) NEGATIVE Final   C Diff toxin NEGATIVE NEGATIVE Final   C Diff interpretation Results are indeterminate. See PCR results.  Final    Comment: Performed at [facility], [address] [place], [place] [address]  C. Diff by PCR, Reflexed     Status: Abnormal   Collection Time: 07/02/23  7:47 PM  Result Value Ref Range Status   Toxigenic C. Difficile by PCR POSITIVE (A) NEGATIVE Final  Comment: Positive for toxigenic C. difficile with little to no toxin production. Only treat if clinical presentation suggests symptomatic illness. Performed at [facility], [address], [place], [place] [address]      Time coordinating discharge: 35 minutes  SIGNED:   Audria Leather, MD  [facility] 07/05/2023, 4:01 PM

## 2023-07-05 NOTE — Progress Notes (Addendum)
 [facility] Progress Note  CC:  Crohn's disease, abdominal pain   Subjective: He is tolerating a regular diet. No nausea or vomiting. No abdominal pain. No further diarrhea overnight or thus far this morning. He had 2 episodes of nonbloody loose stool yesterday. He is less anxious today. He had questions regarding his tumor marker blood test results. I explained to Mr. Klay that his AFP level was normal and the CA 19-9 level was elevated which is concerning for possible cholangiocarcinoma. A liver biopsy to clarify his diagnosis was re-discussed.  He stated he has too much going on and does not wish to consider a liver biopsy at this time or any time soon.   I contacted our outpatient nursing staff who verified his most recent Entyvio  infusion was 07/26/2023, next scheduled infusion is 07/07/23.    Objective:  Vital signs in last 24 hours: Temp:  [97.6 F (36.4 C)-97.9 F (36.6 C)] 97.6 F (36.4 C) (05/06 0411) Pulse Rate:  [74-88] 74 (05/06 0411) Resp:  [18-20] 18 (05/06 0411) BP: (148-167)/(93-102) 167/102 (05/06 0411) SpO2:  [96 %-99 %] 97 % (05/06 0411) Weight:  [70.6 kg] 70.6 kg (05/06 0500) Last BM Date : 07/03/23 General: 58 year old male alert in no acute distress. Heart: Regular rate and rhythm, no murmurs. Pulm: Breath sounds clear throughout.  Abdomen: Soft, nondistended. Nontender. Positive bowel sounds to all 4 quadrants. Extremities:  No edema. Neurologic:  Alert and oriented x 4.  Moves all extremities equally. Psych:  Alert and cooperative. Normal mood and affect.  He is not anxious at this time.  Intake/Output from previous day: 05/05 0701 - 05/06 0700 In: 966.4 [P.O.:340; I.V.:626.4] Out: 1951 [Urine:1950; Stool:1] Intake/Output this shift: No intake/output data recorded.  Lab Results: Recent Labs    07/03/23 0700 07/04/23 0510 07/05/23 0555  WBC 20.0* 12.2* 9.1  HGB 13.7 14.6 16.0  HCT 41.7 44.8 48.4  PLT 112* 122* 145*    BMET Recent Labs    07/03/23 0700 07/04/23 0510 07/05/23 0555  NA 144 142 142  K 3.5 3.4* 3.8  CL 116* 110 107  CO2 22 27 25   GLUCOSE 64* 79 68*  BUN 16 16 12   CREATININE 1.02 0.99 0.93  CALCIUM 7.3* 8.1* 8.4*   LFT Recent Labs    07/05/23 0555  PROT 5.6*  ALBUMIN 2.7*  AST 40  ALT 79*  ALKPHOS 162*  BILITOT 0.9   PT/INR Recent Labs    07/04/23 1634  LABPROT 13.1  INR 1.0   MELD 3.0: 7 at 07/05/2023  5:55 AM MELD-Na: 6 at 07/05/2023  5:55 AM Calculated from: Serum Creatinine: 0.93 mg/dL (Using min of 1 mg/dL) at 03/06/1094  0:45 AM Serum Sodium: 142 mmol/L (Using max of 137 mmol/L) at 07/05/2023  5:55 AM Total Bilirubin: 0.9 mg/dL (Using min of 1 mg/dL) at 4/0/9811  9:14 AM Serum Albumin: 2.7 g/dL at 09/06/2954  2:13 AM INR(ratio): 1 at 07/04/2023  4:34 PM Age at listing (hypothetical): 58 years Sex: Male at 07/05/2023  5:55 AM  Hepatitis Panel No results for input(s): "HEPBSAG", "HCVAB", "HEPAIGM", "HEPBIGM" in the last 72 hours.  ECHOCARDIOGRAM COMPLETE Result Date: 07/03/2023    ECHOCARDIOGRAM REPORT   Patient Name:   BARNARD SAFIER Date of Exam: 07/03/2023 Medical Rec #:  [id number]           Height:       73.0 in Accession #:    [id number]  Weight:       163.8 lb Date of Birth:  11-Aug-1965           BSA:          1.977 m Patient Age:    58 years            BP:           113/65 mmHg Patient Gender: M                   HR:           81 bpm. Exam Location:  Inpatient Procedure: 2D Echo, Cardiac Doppler and Color Doppler (Both Spectral and Color            Flow Doppler were utilized during procedure). Indications:    R55 Syncope  History:        Patient has no prior history of Echocardiogram examinations.                 Signs/Symptoms:Syncope; Risk Factors:Hypertension and                 Dyslipidemia. ETOH. H/O substance abuse.  Sonographer:    Raynelle Callow RDCS Referring Phys: [id number] DAVID MANUEL ORTIZ  Sonographer Comments: Technically difficult study due to poor  echo windows. IMPRESSIONS  1. Left ventricular ejection fraction, by estimation, is 70 to 75%. The left ventricle has hyperdynamic function. The left ventricle has no regional wall motion abnormalities. There is mild asymmetric left ventricular hypertrophy of the basal-septal segment. Left ventricular diastolic parameters are consistent with Grade I diastolic dysfunction (impaired relaxation).  2. Right ventricular systolic function is normal. The right ventricular size is normal. Tricuspid regurgitation signal is inadequate for assessing PA pressure.  3. The mitral valve is normal in structure. No evidence of mitral valve regurgitation. No evidence of mitral stenosis.  4. The aortic valve was not well visualized. There is mild calcification of the aortic valve. There is mild thickening of the aortic valve. Aortic valve regurgitation is not visualized. No aortic stenosis is present.  5. The inferior vena cava is normal in size with greater than 50% respiratory variability, suggesting right atrial pressure of 3 mmHg. FINDINGS  Left Ventricle: Left ventricular ejection fraction, by estimation, is 70 to 75%. The left ventricle has hyperdynamic function. The left ventricle has no regional wall motion abnormalities. The left ventricular internal cavity size was normal in size. There is mild asymmetric left ventricular hypertrophy of the basal-septal segment. Left ventricular diastolic parameters are consistent with Grade I diastolic dysfunction (impaired relaxation). Normal left ventricular filling pressure. Right Ventricle: The right ventricular size is normal. Right vetricular wall thickness was not well visualized. Right ventricular systolic function is normal. Tricuspid regurgitation signal is inadequate for assessing PA pressure. Left Atrium: Left atrial size was normal in size. Right Atrium: Right atrial size was normal in size. Pericardium: There is no evidence of pericardial effusion. Mitral Valve: The mitral  valve is normal in structure. No evidence of mitral valve regurgitation. No evidence of mitral valve stenosis. Tricuspid Valve: The tricuspid valve is normal in structure. Tricuspid valve regurgitation is not demonstrated. No evidence of tricuspid stenosis. Aortic Valve: The aortic valve was not well visualized. There is mild calcification of the aortic valve. There is mild thickening of the aortic valve. There is mild aortic valve annular calcification. Aortic valve regurgitation is not visualized. No aortic stenosis is present. Aortic valve mean gradient measures 8.1 mmHg. Aortic valve peak gradient measures  18.2 mmHg. Aortic valve area, by VTI measures 2.49 cm. Pulmonic Valve: The pulmonic valve was not well visualized. Pulmonic valve regurgitation is not visualized. No evidence of pulmonic stenosis. Aorta: The aortic root and ascending aorta are structurally normal, with no evidence of dilitation. Venous: The inferior vena cava is normal in size with greater than 50% respiratory variability, suggesting right atrial pressure of 3 mmHg. IAS/Shunts: The interatrial septum was not well visualized.  LEFT VENTRICLE PLAX 2D LVIDd:         4.20 cm     Diastology LVIDs:         3.00 cm     LV e' medial:    9.36 cm/s LV PW:         1.00 cm     LV E/e' medial:  7.2 LV IVS:        1.20 cm     LV e' lateral:   11.00 cm/s LVOT diam:     2.50 cm     LV E/e' lateral: 6.1 LV SV:         87 LV SV Index:   44 LVOT Area:     4.91 cm  LV Volumes (MOD) LV vol d, MOD A2C: 84.5 ml LV vol d, MOD A4C: 82.6 ml LV vol s, MOD A2C: 29.8 ml LV vol s, MOD A4C: 28.1 ml LV SV MOD A2C:     54.7 ml LV SV MOD A4C:     82.6 ml LV SV MOD BP:      56.9 ml RIGHT VENTRICLE             IVC RV S prime:     19.00 cm/s  IVC diam: 2.00 cm TAPSE (M-mode): 2.3 cm LEFT ATRIUM             Index        RIGHT ATRIUM          Index LA diam:        4.20 cm 2.12 cm/m   RA Area:     9.61 cm LA Vol (A2C):   54.9 ml 27.78 ml/m  RA Volume:   13.10 ml 6.63 ml/m LA  Vol (A4C):   45.4 ml 22.97 ml/m LA Biplane Vol: 52.7 ml 26.66 ml/m  AORTIC VALVE AV Area (Vmax):    2.25 cm AV Area (Vmean):   2.41 cm AV Area (VTI):     2.49 cm AV Vmax:           213.11 cm/s AV Vmean:          132.009 cm/s AV VTI:            0.349 m AV Peak Grad:      18.2 mmHg AV Mean Grad:      8.1 mmHg LVOT Vmax:         97.70 cm/s LVOT Vmean:        64.900 cm/s LVOT VTI:          0.177 m LVOT/AV VTI ratio: 0.51  AORTA Ao Root diam: 3.00 cm Ao Asc diam:  3.30 cm MITRAL VALVE MV Area (PHT): 3.65 cm     SHUNTS MV Decel Time: 208 msec     Systemic VTI:  0.18 m MV E velocity: 67.40 cm/s   Systemic Diam: 2.50 cm MV A velocity: 105.00 cm/s MV E/A ratio:  0.64 Armida Lander MD Electronically signed by Armida Lander MD Signature Date/Time: 07/03/2023/1:53:38 PM    Final  MR ABDOMEN W WO CONTRAST Result Date: 07/03/2023 CLINICAL DATA:  Primary sclerosing cholangitis. New 3 cm liver lesion. EXAM: MRI ABDOMEN WITHOUT AND WITH CONTRAST TECHNIQUE: Multiplanar multisequence MR imaging of the abdomen was performed both before and after the administration of intravenous contrast. CONTRAST:  7mL GADAVIST  GADOBUTROL  1 MMOL/ML IV SOLN COMPARISON:  Abdomen and pelvis CT 07/02/2023. MRI abdomen 10/31/2021 FINDINGS: Lower chest: Insert lower chest Hepatobiliary: As noted on prior CT, there is atrophy or prior resection in the right hepatic lobe. Lesion in question on CT scan yesterday measures approximately 3.7 x 2.8 x 4.3 cm in the lateral aspect of the hepatic dome and shows heterogeneous enhancement after IV contrast administration. Immediately inferior and posterior to this lesion is a cyst measuring 5.0 x 4.5 cm, increased from 4.2 x 4.3 cm on previous MRI. Between the new heterogeneously enhancing lesion and the dominant cyst is a 1.3 x 1.1 cm cystic lesion (11/7) that is similar to previous MRI. Gallbladder demonstrates distorted anatomy but is otherwise unremarkable. Although not a dedicated MRCP exam, as on  previous MR bile duct show areas of variable dilatation in stenosis compatible with the reported clinical history of primary sclerosing cholangitis. Common bile duct in the head of the pancreas is nondilated. Pancreas: No focal mass lesion. No dilatation of the main duct. No intraparenchymal cyst. No peripancreatic edema. Spleen:  No splenomegaly. No suspicious focal mass lesion. Adrenals/Urinary Tract: No adrenal nodule or mass. Kidneys unremarkable. Stomach/Bowel: Stomach is unremarkable. No gastric wall thickening. No evidence of outlet obstruction. Duodenum is normally positioned as is the ligament of Treitz. No small bowel or colonic dilatation within the visualized abdomen. Vascular/Lymphatic: No abdominal aortic aneurysm. No abdominal lymphadenopathy. Other:  No substantial intraperitoneal free fluid. Musculoskeletal: No focal suspicious marrow enhancement within the visualized bony anatomy. IMPRESSION: 1. Lesion in question on CT scan yesterday measures approximately 3.7 x 2.8 x 4.3 cm in the lateral aspect of the hepatic dome. Lesion shows heterogeneous enhancement after IV contrast administration, suspicious for neoplasm. Given the history of primary sclerosing cholangitis, cholangiocarcinoma would be a distinct concern. Tissue sampling recommended. 2. Immediately inferior and posterior to this lesion is a cyst measuring 5.0 x 4.5 cm, increased from 4.2 x 4.3 cm on previous MRI. 3. Although not a dedicated MRCP exam, as on previous MR, the bile ducts show areas of variable dilatation and stenosis compatible with the reported clinical history of primary sclerosing cholangitis. Electronically Signed   By: Donnal Fusi M.D.   On: 07/03/2023 12:35    Patient Profile: 58 year old male with history of anxiety, depression, past suicide attempt, osteoarthritis, portal vein thrombosis, chronic mesenteric ischemia, chronic pain syndrome, Crohn's disease, perianal fistula, C. difficile colitis, primary sclerosing  cholangitis, autoimmune hepatitis and cirrhosis admitted with multiple episodes of diarrhea with dizziness, fall and near syncope.    Assessment / Plan:   Crohn's disease of the large/small intestine and perianal disease on Entyvio  admitted 07/02/2023 with suspected Crohn's flare, abdominal pain and diarrhea with near syncope. CTAP without contrast 5/3 showed mild ill definition of the wall of the descending colon with subtle pericolonic edema in the same region. Imaging features suggest colitis. Initially started on Rocephin and Flagyl  IV which was discontinued after he tested positive for C. difficile. His last Entyvio  infusion was 05/26/2023, next infusion due 5/8. -Pain management per the hospitalist  -Diet as tolerated  -Dr. Venice Gillis to verify timing of next Entyvio  infusion    C. difficile colitis, recently on oral Doxycycline  for  a perianal abscess. On Vancomycin  125 mg 4 times daily started on 07/03/2023. No further diarrhea overnight or thus far this morning.  - Continue Vancomycin  4 times daily x 14 days   Autoimmune hepatitis with primary sclerosing cholangitis overlap with cirrhosis on chronic Prednisone . MELD 3.0: 7. T. Bili 0.6 -> 0.9. Alk phos 157 -> 162. AST 45 ->40. ALT 84 -> 79.   History of alcohol use disorder   New liver lesion seen on CT. MRI identified a 3.7 x 2.8 x 4.3 cm liver lesion in the lateral aspect of the hepatic dome concerning for [name] and a 5.0 x 4.5 cm cystic lesion. T. Bili 0.6. Alk phos 157. AST 45. ALT 84. AFP < 1.8. CA 19-9 897, concerning for cholangiocarcinoma. Patient was cleared by Diantha Fossa psychiatry NP regarding liver biopsy (he does appear to have capacity to make decisions). IR was consulted, patient did not wish to purse a liver biopsy at this time.  -Liver biopsy deferred as patient refused -? Consider oncology consult during this hospitalization   Leukocytosis secondary to chronic Prednisone  use   Thrombocytopenia.  PLT 122 -> 145.        Principal Problem:   Exacerbation of Crohn's disease ([name]) Active Problems:   Hyperlipidemia   Essential hypertension   Dehydration   Hypokalemia   Enteritis due to Clostridium difficile   PTSD (post-traumatic stress disorder)   MDD (major depressive disorder)   Tremor   AKI (acute kidney injury) ([name])   Protein-calorie malnutrition, severe ([name])   Transaminitis   Thrombocytopenia ([name])   Near syncope   Leukocytosis   Suicidal ideation   Abnormal finding on imaging of liver   PSC (primary sclerosing cholangitis)   Major depressive disorder, recurrent episode, moderate ([name])   Liver lesion, right lobe   Clostridium difficile colitis     LOS: 3 days   Colleen M Kennedy-Smith  07/05/2023, 10:00 AM   Attending physician's note   I have taken history, reviewed the chart and examined the patient. I performed a substantive portion of this encounter, including complete performance of at least one of the key components, in conjunction with the APP. I agree with the Advanced Practitioner's note, impression and recommendations.   Liver lesion -highly S/O cholangiocarcinoma (on MRI) with underlying PSC. CA 19-9: 897 (was 20 previously) Crohn's disease with perianal involvement on chronic prednisone /Entyvio  (last dose 3/27) C. difficile colitis-diarrhea has resolved.  Plan: - He was scheduled for IR Liver Bx today-but refused.  He told me that he would consider it as an outpt - Vancomycin  PO x total 14 days - FU GI as outpt APP clinic or Dr Willy Harvest.    Magnus Schuller, MD [facility] [phone]

## 2023-07-05 NOTE — Plan of Care (Signed)

## 2023-07-05 NOTE — Consult Note (Addendum)
 [facility] Psychiatric Consult Follow-up  Patient Name: .[name]  MRN: [id number]  DOB: [birth year]  Consult Order details:  Orders (From admission, onward)     Start     Ordered   04/13/23 1009  CONSULT TO CALL ACT TEAM       Ordering Provider: Karlyn Overman, MD  Provider:  (Not yet assigned)  Question:  Reason for Consult?  Answer:  suicide attempt   04/13/23 1008             Mode of Visit: In person    Psychiatry Consult Evaluation  Service Date: Jul 05, 2023 LOS:  LOS: 3 days  Chief Complaint "The medications are working" with no suicidal ideations "right now"  Primary Psychiatric Diagnoses  Major Depressive Disorder, recurrent moderate Insomnia  3.  GAD (generalized anxiety disorder)  Assessment  [name] is a 58 y.o. male admitted: Presented to the ED on 07/02/2023 10:25 AM for diarrhea, diagnosed with C-diff. He carries the psychiatric diagnoses of Major Depressive Disorder, recurrent, Insomnia, and GAD (generalized anxiety disorder) and has a past medical history of fistula, osteoarthritis, autoimmune hepatitis, chronic mesenteric ischemia, chronic pain syndrome, Crohn's disease, C. difficile colitis, portal vein thrombosis, primary sclerosing cholangitis   His current presentation of dysphoric mood, intermittent suicidal ideations, low motivation and energy, anhedonia, and self-isolating. Current outpatient psychotropic medications include buspar , lexapro , remeron  and historically he has had a positive response to these medications. He was compliant with medications prior to admission as evidenced by his report. On initial examination, patient is cooperative, and appears to be sad. Please see plan below for detailed recommendations.   Patient seen and assessed by this psychiatric nurse practitioner. Patient is alert and oriented x4, calm and cooperative, very attentive and engages well with psychiatric nurse practitioner.   On today's evaluation  he is observed to be standing in the room. He appears to be receptive to current situation at hand to include going home to follow up with outpatient biopsy.  He further denies any acute psychiatric symptoms that include suicidal ideations, homicidal ideations, and or auditory or visual hallucinations.  Patient denies any complaints and or questions. He does not mention hunger during this evaluation.   He is very appropriate and does seem to have a linear conversation.  There does not appear to be any evidence of confabulation, psychosis, delusional thinking.  He does not appear to be responding to internal stimuli, external stimuli.  He is able to engage well. He further denies any thoughts to want to harm himself or other people.  He has been compliant with his psychotropic medications outside of the hospital.  Will psychiatrically clear at this time.     Diagnoses:  Active Hospital problems: Principal Problem:   Exacerbation of Crohn's disease ([name]) Active Problems:   Hyperlipidemia   Essential hypertension   Dehydration   Hypokalemia   Enteritis due to Clostridium difficile   PTSD (post-traumatic stress disorder)   MDD (major depressive disorder)   Tremor   AKI (acute kidney injury) ([name])   Protein-calorie malnutrition, severe ([name])   Transaminitis   Thrombocytopenia ([name])   Near syncope   Leukocytosis   Suicidal ideation   Abnormal finding on imaging of liver   PSC (primary sclerosing cholangitis)   Major depressive disorder, recurrent episode, moderate ([name])   Liver lesion, right lobe   Clostridium difficile colitis    Plan   ## Psychiatric Medication Recommendations:  -hydroxyzine  25 mg TID PRN anxiety -Effexor  225  mg daily -Remeron  30 mg at bedtime decreased to 15 mg to better target the histamine receptor - Continue  BuSpar  7.5 mg p.o. three daily for anxiety and panic attacks.   ## Medical Decision Making Capacity:  Patient is his own legal guardian  ## Further  Work-up:  -- No further work up needed at this time  EKG or UDS -- most recent EKG on 07/02/23 had QtC of 443 -- Pertinent labwork reviewed earlier this admission includes: CMP, BNP, EKG, UDS   ## Disposition:-- Home. FOllow outpatient  ## Behavioral / Environmental: -To minimize splitting of staff, assign one staff person to communicate all information from the team when feasible. or Utilize compassion and acknowledge the patient's experiences while setting clear and realistic expectations for care.    ## Safety and Observation Level:  - Based on my clinical evaluation, I estimate the patient to be at low risk of self harm in the current setting. - At this time, we recommend  routine. This decision is based on my review of the chart including patient's history and current presentation, interview of the patient, mental status examination, and consideration of suicide risk including evaluating suicidal ideation, plan, intent, suicidal or self-harm behaviors, risk factors, and protective factors. This judgment is based on our ability to directly address suicide risk, implement suicide prevention strategies, and develop a safety plan while the patient is in the clinical setting. Please contact our team if there is a concern that risk level has changed.  CSSR Risk Category:C-SSRS RISK CATEGORY: High Risk  Suicide Risk Assessment: Patient has following modifiable risk factors for suicide: under treated depression  and social isolation, which we are addressing by recommending medication management and follow up with outpatient mental health services. Patient has following non-modifiable or demographic risk factors for suicide: male gender, history of suicide attempt, history of self harm behavior, and psychiatric hospitalization Patient has the following protective factors against suicide: Supportive family  Thank you for this consult request. Recommendations have been communicated to the primary  team.  We will sign off at this time.   Rella Cardinal, FNP       History of Present Illness  Relevant Aspects of Hospital ED Course:  Admitted on 07/02/2023 for suicide attempt with intentional overdose.  Patient Report: [name], 58 y.o., male Patient was seen face-to-face by this provider for evaluation of depression and anxiety with a history of prior suicide attempts.   Patient seen lying in bed, and reports feeling better than yesterday. He reports 2 panic attacks yesterday that seemingly responded well to Ativan . He declined liver biopsy and PT yesterday due to feeling of anxiety "impending doom". He reports compliance with his medication, and inquires about discharge planning. We reviewed ongoing use of Buspar  to help with management of anxiety and panic disorder. He reports that he is eating and sleeping well. He recently ate breakfast and unable to have his liver biopsy today; emphasis is placed on avoidance of the the liver biopsy and importance of following up with GI upon discharge. Questioned about liver biopsy and sense of peace, in which he declines any intent to forego treatment of possible malignancy as an end of life attempt.  He denies suicidal ideations, recent suicidal thoughts, access to weapons.    Collateral from brother Hinton Luis) : No safety concerns at this time. Brother is worried about Norovirus and taking care of elderly parents while patient is in the hospital.  Psych ROS:  Depression: DEnies  Anxiety:  Positive Mania (lifetime and current): Denies Psychosis: (lifetime and current): Denies   Review of Systems  Gastrointestinal:  Positive for abdominal pain and diarrhea.  Psychiatric/Behavioral:  Positive for depression. The patient is nervous/anxious.   All other systems reviewed and are negative.    Psychiatric and Social History  Psychiatric History:  Information collected from patient and chart review  Prev Dx/Sx: MDD, GAD Current Psych  Provider: None  Home Meds (current): Effexor , Remeron , hydroxyzine  Previous Med Trials: Unknown  Therapy: None   Prior Psych Hospitalization: Yes  Prior Self Harm: Yes Prior Violence: Yes  Family Psych History: Patient grandmother had paranoid schizophrenia Family Hx suicide: Unknown   Social History:  Developmental Hx: Patient appears age appropriate  Educational Hx: Graduated high school Occupational Hx: Unemployed Legal Hx: Denies  Living Situation: lives with his parents Spiritual Hx: Unknown  Access to weapons/lethal means: Denies   Substance History Alcohol: No, not since 1997 Type of alcohol No  Last Drink No  Number of drinks per day No  History of alcohol withdrawal seizures No  History of DT's Denies  Tobacco: Denies Yes Illicit drugs: No Prescription drug abuse: Denies Rehab hx: Yes  Exam Findings  Physical Exam:  Vital Signs:  Temp:  [97.6 F (36.4 C)-97.9 F (36.6 C)] 97.6 F (36.4 C) (05/06 0411) Pulse Rate:  [74-88] 74 (05/06 0411) Resp:  [18-20] 18 (05/06 0411) BP: (148-167)/(93-102) 167/102 (05/06 0411) SpO2:  [96 %-99 %] 97 % (05/06 0411) Weight:  [70.6 kg] 70.6 kg (05/06 0500) Blood pressure (!) 167/102, pulse 74, temperature 97.6 F (36.4 C), resp. rate 18, height 6\' 1"  (1.854 m), weight 70.6 kg, SpO2 97%. Body mass index is 20.53 kg/m.  Physical Exam Vitals and nursing note reviewed.  Constitutional:      Appearance: Normal appearance.  HENT:     Head: Normocephalic.  Pulmonary:     Effort: Pulmonary effort is normal.  Neurological:     General: No focal deficit present.     Mental Status: He is alert and oriented to person, place, and time.  Psychiatric:        Attention and Perception: Attention normal.        Mood and Affect: Mood is anxious and depressed. Affect is flat.        Speech: Speech normal.        Behavior: Behavior is cooperative.        Thought Content: Thought content normal.        Cognition and Memory: Cognition  normal.        Judgment: Judgment is inappropriate.    Mental Status Exam: General Appearance: Casual  Orientation:  Full (Time, Place, and Person)  Memory:  Fair  Concentration:  Concentration: Poor and Attention Span: Fair  Recall:  Fair  Attention  Fair  Eye Contact:  Good  Speech:  Clear and Coherent  Language:  Good  Volume:  WDL  Mood: Better than yesterday  Affect:  Blunt  Thought Process:  Coherent  Thought Content:  Denies   Suicidal Thoughts:  Denies  Homicidal Thoughts:  No  Judgement:  Fair to poor  Insight:  Fair  Psychomotor Activity:  Normal  Akathisia:  No  Fund of Knowledge:  Fair      Assets:  Manufacturing systems engineer Desire for Improvement Social Support  Cognition:  WNL  ADL's:  Intact  AIMS (if indicated):        Other History   These have been pulled  in through the EMR, reviewed, and updated if appropriate.  Family History:  The patient's family history includes Allergies in his father; Breast cancer in his mother; Clotting disorder in his father; Heart disease in his father; Stomach cancer in his mother; Thyroid  cancer in his father.  Medical History: 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 06/15/2018   Cancer of skin of neck    Cataract    Chronic mesenteric ischemia ([name])    Chronic pain syndrome 07/09/2016   Crohn's disease of small and large intestines ([name])    followed by dr Gorman Laughter gessner   Dairy product intolerance    Diarrhea, functional    Family history of adverse reaction to anesthesia    Grover's disease    transient acantholytic dermatosis   History of alcohol abuse    History of basal cell carcinoma excision    2013 left leg   History of Clostridium difficile    10/ 2014   History of multiple  concussions    x6   last one Jan 2017 per pt--  no residual   History of substance abuse ([name])    quit 1997 per pt   History of suicide attempt    05-18-2012  overdose /  failure to thrive   Iron  deficiency anemia due to chronic blood loss    Major depression, recurrent, chronic ([name])    Osteopenia    Personal history of adenomatous colonic polyps 12/2010, 03/2012   12/2010 - 8 mm serrated adenoma of rectum   Portal vein thrombosis 03/21/2015   right   Primary sclerosing cholangitis    ? 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   Substance abuse ([name]) 1997   Alcohol   Vitamin A  deficiency 05/23/2018   Vitamin B6 deficiency 07/27/2018   Vitamin C deficiency 05/23/2018    Surgical History: Past Surgical History:  Procedure Laterality Date   ABDOMINAL AORTAGRAM N/A 03/29/2012   Procedure: ABDOMINAL Tommi Fraise;  Surgeon: Margherita Shell, MD;  Location: [facility] CATH LAB;  Service: Cardiovascular;  Laterality: N/A;   ADENOIDECTOMY  age 75   BIOPSY  05/31/2018   Procedure: BIOPSY;  Surgeon: Janel Medford, MD;  Location: [facility] ENDOSCOPY;  Service: Endoscopy;;   CATARACT EXTRACTION W/ INTRAOCULAR LENS  IMPLANT, BILATERAL  2009   COLONOSCOPY  2001, 05/02/2003, 01/28/11   2012: Right colon Crohn's, rectal polyp   COLONOSCOPY  03/31/2012   Procedure: COLONOSCOPY;  Surgeon: Nannette Babe, MD;  Location: [facility] ENDOSCOPY;  Service: Gastroenterology;  Laterality: N/A;   COLONOSCOPY WITH PROPOFOL  N/A 05/31/2018   Procedure: COLONOSCOPY WITH PROPOFOL ;  Surgeon: Janel Medford, MD;  Location: [facility] ENDOSCOPY;  Service: Endoscopy;  Laterality: N/A;   ESOPHAGOGASTRODUODENOSCOPY  01/28/2011   Normal   FOOT SURGERY Right age 26   MOHS SURGERY Left 11/2013   left ankle parakerotosis    PERCUTANEOUS LIVER BIOPSY  2007 and 2008   PILONIDAL CYST EXCISION  age 25   PLACEMENT OF SETON N/A 11/06/2015   Procedure: PLACEMENT OF SETON;  Surgeon: Joyce Nixon, MD;  Location: [facility];  Service: General;  Laterality: N/A;  PLACEMENT OF SETON  07/2018   at [facility]   RECTAL EXAM UNDER ANESTHESIA N/A 01/18/2019   Procedure: ANAL EXAM UNDER ANESTHESIA,  INCISION AND DRAINAGE, SETON PLACEMENT;  Surgeon: Joyce Nixon, MD;  Location: [facility];  Service: General;  Laterality: N/A;   UPPER GASTROINTESTINAL ENDOSCOPY       Medications:   Current Facility-Administered Medications:    acetaminophen  (TYLENOL ) tablet 650 mg, 650 mg, Oral, Q6H PRN **OR** acetaminophen  (TYLENOL ) suppository 650 mg, 650 mg, Rectal, Q6H PRN, Danice Dural, MD   busPIRone  (BUSPAR ) tablet 7.5 mg, 7.5 mg, Oral, TID, Starkes-Perry, Allana Ishikawa, FNP   enoxaparin  (LOVENOX ) injection 40 mg, 40 mg, Subcutaneous, Q24H, Danice Dural, MD, 40 mg at 07/04/23 2136   hydrALAZINE (APRESOLINE) tablet 25 mg, 25 mg, Oral, Q6H PRN, Maury Space, Kshitiz, MD   hydrOXYzine  (ATARAX ) tablet 25 mg, 25 mg, Oral, TID PRN, Danice Dural, MD, 25 mg at 07/05/23 0631   mirtazapine  (REMERON ) tablet 15 mg, 15 mg, Oral, QHS, Lord, Jamison Y, NP, 15 mg at 07/04/23 2136   ondansetron  (ZOFRAN ) tablet 4 mg, 4 mg, Oral, Q6H PRN, 4 mg at 07/02/23 2037 **OR** ondansetron  (ZOFRAN ) injection 4 mg, 4 mg, Intravenous, Q6H PRN, Danice Dural, MD   predniSONE  (DELTASONE ) tablet 30 mg, 30 mg, Oral, Q breakfast, Danice Dural, MD, 30 mg at 07/05/23 4696   protein supplement (ENSURE MAX) liquid, 237 mL, Oral, BID, Danice Dural, MD, 237 mL at 07/05/23 2952   vancomycin  (VANCOCIN ) capsule 125 mg, 125 mg, Oral, QID, Alekh, Kshitiz, MD, 125 mg at 07/05/23 8413   venlafaxine  XR (EFFEXOR -XR) 24 hr capsule 150 mg, 150 mg, Oral, Daily, Danice Dural, MD, 150 mg at 07/05/23 2440   venlafaxine  XR (EFFEXOR -XR) 24 hr capsule 75 mg, 75 mg, Oral, Daily, Danice Dural, MD, 75 mg at 07/05/23 1027  Allergies: Allergies  Allergen Reactions   Tape Itching, Rash  and Other (See Comments)    Please use "paper" tape   Asacol  [Mesalamine ] Diarrhea and Other (See Comments)    Abd pain, too   Azathioprine Diarrhea and Other (See Comments)    Abd pain, too   Gluten Meal Diarrhea   Metoprolol  Tartrate Nausea Only and Other (See Comments)    Stomach upset   Mycophenolate Mofetil Diarrhea and Other (See Comments)    Abd pain, too   Other Diarrhea and Other (See Comments)    NUTS   Wheat Diarrhea and Other (See Comments)    Constipation; flatulence; abd pain (can tolerate "sometimes")   Abilify  [Aripiprazole ] Diarrhea   Amoxicillin  Other (See Comments)    Lots of gas    Rella Cardinal, FNP  I have reviewed the note by NP Starkes-Perry, and discussed the plan of care.  I am in agreement with the assessment and plan. While future psychiatric events cannot be accurately predicted, the patient does not currently require acute inpatient psychiatric care and does not currently meet [place]  involuntary commitment criteria. Psychiatry will sign off.  Please re-consult for any future acute psychiatric concerns.   Mervyn Ace, MD

## 2023-07-05 NOTE — TOC Transition Note (Signed)
 Transition of Care [facility]) - Discharge Note   Patient Details  Name: [name] MRN: [id number] Date of Birth: January 21, 1966  [facility]) CM/SW Contact:  Marty Sleet, LCSW Phone Number: 07/05/2023, 4:02 PM  Clinical Narrative:    Pt to return home at discharge. No TOC needs identified. TOC signing off.    Final next level of care: Home/Self Care Barriers to Discharge: Barriers Resolved   Patient Goals and CMS Choice Patient states their goals for this hospitalization and ongoing recovery are:: To return home CMS Medicare.gov Compare Post Acute Care list provided to:: Other (Comment Required) Choice offered to / list presented to : NA [place] ownership interest in [facility].provided to:: Parent NA    Discharge Placement                       Discharge Plan and Services Additional resources added to the After Visit Summary for   In-house Referral: NA Discharge Planning Services: CM Consult Post Acute Care Choice: NA          DME Arranged: N/A DME Agency: NA       HH Arranged: NA HH Agency: NA        Social Drivers of Health (SDOH) Interventions SDOH Screenings   Food Insecurity: No Food Insecurity (07/02/2023)  Recent Concern: Food Insecurity - Food Insecurity Present (04/13/2023)  Housing: Low Risk  (07/02/2023)  Recent Concern: Housing - High Risk (04/13/2023)  Transportation Needs: No Transportation Needs (07/02/2023)  Utilities: Not At Risk (07/02/2023)  Alcohol Screen: Low Risk  (04/30/2023)  Depression (PHQ2-9): Medium Risk (06/06/2020)  Financial Resource Strain: Low Risk  (08/28/2019)  Physical Activity: Insufficiently Active (11/08/2019)  Social Connections: Socially Isolated (07/02/2023)  Stress: Stress Concern Present (08/28/2019)  Tobacco Use: Medium Risk (07/02/2023)     Readmission Risk Interventions    07/04/2023   11:30 AM  Readmission Risk Prevention Plan  Transportation Screening Complete  PCP or Specialist Appt  within 3-5 Days Complete  HRI or Home Care Consult Complete  Social Work Consult for Recovery Care Planning/Counseling Complete  Palliative Care Screening Complete  Medication Review Oceanographer) Complete

## 2023-07-05 NOTE — Progress Notes (Signed)
 TOC meds in a secure bag delivered to pt in room by this RN.

## 2023-07-05 NOTE — Evaluation (Signed)
 Physical Therapy Evaluation Patient Details Name: [name] MRN: [id number] DOB: 03-Feb-1966 Today's Date: 07/05/2023  History of Present Illness  Pt is 58 yo male admitted on 07/02/23 after diarrhea with dizziness, near syncope, and fall.  Pt with C diff colitis and concern for exacerbation of Crohn's disease.  Pt incidentally found to have lesion on liver suggestive of neoplasm with biopsy recommended.  Also with SI and psychiatry consulted.  Pt with hx including but not limited to fistula, anxiety, osteoarthritis, autoimmune hepatitis, chronic mesenteric ischemia, chronic pain syndrome, Crohn's disease, C. difficile colitis, depression, portal vein thrombosis, primary sclerosing cholangitis  Clinical Impression  Pt admitted with above diagnosis. He is independent and helps care for his parents at baseline.  Pt has been ambulating in room independently.  He demonstrated safe hallway ambulation, stairs, and dynamic balance.  Pt is near baseline, no further PT needs.         If plan is discharge home, recommend the following:     Can travel by private vehicle        Equipment Recommendations None recommended by PT  Recommendations for Other Services       Functional Status Assessment Patient has not had a recent decline in their functional status     Precautions / Restrictions Precautions Precautions: None      Mobility  Bed Mobility Overal bed mobility: Independent                  Transfers Overall transfer level: Independent                 General transfer comment: Has been ambulating in room independently.  Demonstrated transfers safely    Ambulation/Gait Ambulation/Gait assistance: Supervision Gait Distance (Feet): 400 Feet Assistive device: None Gait Pattern/deviations: WFL(Within Functional Limits) Gait velocity: normal     General Gait Details: Pt ambulating in room independently , demonstrated hallway ambulation safely with no major gait  deviations  Stairs Stairs: Yes Stairs assistance: Supervision Stair Management: One rail Right, Alternating pattern, Forwards Number of Stairs: 12 General stair comments: demonstrated safely  Wheelchair Mobility     Tilt Bed    Modified Rankin (Stroke Patients Only)       Balance Overall balance assessment: Needs assistance Sitting-balance support: No upper extremity supported Sitting balance-Leahy Scale: Good     Standing balance support: No upper extremity supported Standing balance-Leahy Scale: Good                 High Level Balance Comments: Pt able to turn, stop, change speeds, look up/down/L/R and perform stairs             Pertinent Vitals/Pain Pain Assessment Pain Assessment: No/denies pain    Home Living Family/patient expects to be discharged to:: Private residence Living Arrangements: Parent Available Help at Discharge: Family;Available PRN/intermittently (Pt helping to take care of his parents; brother also there if needed) Type of Home: House Home Access: Stairs to enter Entrance Stairs-Rails: None Entrance Stairs-Number of Steps: 2 Alternate Level Stairs-Number of Steps: 12 (his bed/bath is on 2nd floor) Home Layout: Two level Home Equipment: Agricultural consultant (2 wheels)      Prior Function Prior Level of Function : Independent/Modified Independent;Driving             Mobility Comments: Reports could ambulate in community; no falls other than one associated with syncope at admission ADLs Comments: independent adls and iadls     Extremity/Trunk Assessment   Upper Extremity Assessment Upper  Extremity Assessment: Overall WFL for tasks assessed    Lower Extremity Assessment Lower Extremity Assessment: Overall WFL for tasks assessed    Cervical / Trunk Assessment Cervical / Trunk Assessment: Normal  Communication        Cognition Arousal: Alert Behavior During Therapy: WFL for tasks assessed/performed   PT - Cognitive  impairments: No apparent impairments                                 Cueing       General Comments      Exercises     Assessment/Plan    PT Assessment Patient does not need any further PT services  PT Problem List         PT Treatment Interventions      PT Goals (Current goals can be found in the Care Plan section)  Acute Rehab PT Goals Patient Stated Goal: return home PT Goal Formulation: All assessment and education complete, DC therapy    Frequency       Co-evaluation               AM-PAC PT "6 Clicks" Mobility  Outcome Measure Help needed turning from your back to your side while in a flat bed without using bedrails?: None Help needed moving from lying on your back to sitting on the side of a flat bed without using bedrails?: None Help needed moving to and from a bed to a chair (including a wheelchair)?: None Help needed standing up from a chair using your arms (e.g., wheelchair or bedside chair)?: None Help needed to walk in hospital room?: None Help needed climbing 3-5 steps with a railing? : None 6 Click Score: 24    End of Session Equipment Utilized During Treatment: Gait belt Activity Tolerance: Patient tolerated treatment well Patient left: in bed;with call bell/phone within reach Nurse Communication: Mobility status PT Visit Diagnosis: Other abnormalities of gait and mobility (R26.89)    Time: 1610-9604 PT Time Calculation (min) (ACUTE ONLY): 10 min   Charges:   PT Evaluation $PT Eval Low Complexity: 1 Low   PT General Charges $$ ACUTE PT VISIT: 1 Visit         Cyd Dowse, PT Acute Rehab Services [facility] [phone]   Carolynn Citrin 07/05/2023, 11:54 AM

## 2023-07-05 NOTE — Progress Notes (Addendum)
 PROGRESS NOTE    [name]  UJW:[id number] DOB: [birth year] DOA: 07/02/2023 PCP: Jimmey Mould, MD   Brief Narrative:  58 year old male with history of an fistula, anxiety, osteoarthritis, autoimmune hepatitis, chronic mesenteric ischemia, chronic pain syndrome, Crohn's disease, C. difficile colitis, depression, portal vein thrombosis, primary sclerosing cholangitis presented with multiple episodes of diarrhea with dizziness and fall and near syncope.  On presentation, potassium was 3, creatinine of 1.41.  CT of abdomen and pelvis showed possible colitis and noncystic lesion in the liver, MRI recommended.  He was started on antibiotics.  GI was consulted.  He also had suicidal ideation: Psychiatry consulted.  Assessment & Plan:   C. difficile colitis -Patient presented with diarrhea and imaging showed possible colitis.  Initially started on broad-spectrum antibiotics with Rocephin and Flagyl .  Tested positive for C. difficile.  DC'd Rocephin and Flagyl .  Started oral vancomycin  on 07/03/2023: Continue for 10 to 14 days.  History of Crohn's disease with concern for exacerbation with history of perianal fistula - GI following.  Currently on prednisone .  On Entyvio  infusion as an outpatient.  New liver lesion - MRI of abdomen is suggestive of neoplasm, cholangiocarcinoma would be a distinct concern.   - GI recommended liver biopsy by IR.  Patient refused liver biopsy yesterday. Today, he says that he is willing to get it done but wants to talk to IR again.  Leukocytosis - Resolved  Thrombocytopenia -Questionable cause.  No signs of bleeding.  Monitor  Hypokalemia - Improved   essential hypertension - Blood pressure intermittently elevated.  Use oral hydralazine as needed for now.  Might have to start scheduled antihypertensives if continues to remain elevated.  Protein calorie malnutrition, severe - Consult nutrition  PTSD MDD Suicidal ideation - Continue mirtazapine   and venlafaxine  XR along with hydroxyzine  as needed.  BuSpar  added by psychiatry.  Psychiatry following.  DVT prophylaxis: Lovenox  Code Status: Full Family Communication: None at bedside Disposition Plan: Status is: Inpatient Remains inpatient appropriate because: Of severity of illness    Consultants: GI/psychiatry/IR  Procedures: None  Antimicrobials:  Anti-infectives (From admission, onward)    Start     Dose/Rate Route Frequency Ordered Stop   07/03/23 1000  vancomycin  (VANCOCIN ) capsule 125 mg        125 mg Oral 4 times daily 07/03/23 0812 07/13/23 0959   07/02/23 1830  cefTRIAXone (ROCEPHIN) 2 g in sodium chloride  0.9 % 100 mL IVPB  Status:  Discontinued        2 g 200 mL/hr over 30 Minutes Intravenous Every 24 hours 07/02/23 1742 07/03/23 0812   07/02/23 1830  metroNIDAZOLE  (FLAGYL ) IVPB 500 mg  Status:  Discontinued        500 mg 100 mL/hr over 60 Minutes Intravenous Every 12 hours 07/02/23 1742 07/03/23 5621        Subjective: Patient seen and examined at bedside.  Poor historian.  Diarrhea is improving.  He is thinking about proceeding with liver biopsy but wants to talk to IR again.  No fever, seizures, vomiting reported.  Objective: Vitals:   07/04/23 1238 07/04/23 2006 07/05/23 0411 07/05/23 0500  BP: (!) 148/93 (!) 155/98 (!) 167/102   Pulse: 88 80 74   Resp: 20 18 18    Temp: 97.6 F (36.4 C) 97.9 F (36.6 C) 97.6 F (36.4 C)   TempSrc: Oral     SpO2: 99% 96% 97%   Weight:    70.6 kg  Height:  Intake/Output Summary (Last 24 hours) at 07/05/2023 0803 Last data filed at 07/04/2023 1500 Gross per 24 hour  Intake 966.4 ml  Output 1451 ml  Net -484.6 ml   Filed Weights   07/02/23 1031 07/03/23 0402 07/05/23 0500  Weight: 74.8 kg 74.3 kg 70.6 kg    Examination:  General: No acute distress currently on room air.  Looks chronically ill and deconditioned.   ENT/neck: No obvious JVD elevation or palpable neck masses noted  respiratory: Mildly  decreased breath sounds at bases with scattered crackles  CVS: Most rate controlled; S1 and S2 are heard  abdominal: Soft, nontender, distended mildly; no organomegaly, bowel sounds are heard normally Extremities: No clubbing; mild lower extremity edema present  CNS: Still very slow to respond, poor historian.  Alert and oriented.  No focal neurologic deficit.  Able to move extremities Lymph: No palpable lymphadenopathy Skin: No obvious petechia/rashes psych: Not agitated.  Flat affect. musculoskeletal: No obvious joint tenderness/erythema    Data Reviewed: I have personally reviewed following labs and imaging studies  CBC: Recent Labs  Lab 07/02/23 1049 07/03/23 0700 07/04/23 0510 07/05/23 0555  WBC 27.4* 20.0* 12.2* 9.1  NEUTROABS 23.8*  --  9.8* 6.6  HGB 15.1 13.7 14.6 16.0  HCT 45.3 41.7 44.8 48.4  MCV 96.0 97.7 97.6 96.6  PLT 120* 112* 122* 145*   Basic Metabolic Panel: Recent Labs  Lab 07/02/23 1049 07/03/23 0700 07/04/23 0510 07/05/23 0555  NA 136 144 142 142  K 3.0* 3.5 3.4* 3.8  CL 102 116* 110 107  CO2 24 22 27 25   GLUCOSE 82 64* 79 68*  BUN 25* 16 16 12   CREATININE 1.41* 1.02 0.99 0.93  CALCIUM 7.5* 7.3* 8.1* 8.4*  MG  --  1.9 2.1 2.0  PHOS  --  1.9*  --   --    GFR: Estimated Creatinine Clearance: 84.3 mL/min (by C-G formula based on SCr of 0.93 mg/dL). Liver Function Tests: Recent Labs  Lab 07/02/23 1049 07/03/23 0700 07/04/23 0510 07/05/23 0555  AST 37 28 45* 40  ALT 88* 66* 84* 79*  ALKPHOS 140* 113 157* 162*  BILITOT 0.9 0.6 0.6 0.9  PROT 5.0* 4.5* 5.3* 5.6*  ALBUMIN 2.4* 2.1* 2.5* 2.7*   No results for input(s): "LIPASE", "AMYLASE" in the last 168 hours. No results for input(s): "AMMONIA" in the last 168 hours. Coagulation Profile: Recent Labs  Lab 07/04/23 1634  INR 1.0   Cardiac Enzymes: No results for input(s): "CKTOTAL", "CKMB", "CKMBINDEX", "TROPONINI" in the last 168 hours. BNP (last 3 results) No results for input(s):  "PROBNP" in the last 8760 hours. HbA1C: No results for input(s): "HGBA1C" in the last 72 hours. CBG: No results for input(s): "GLUCAP" in the last 168 hours. Lipid Profile: No results for input(s): "CHOL", "HDL", "LDLCALC", "TRIG", "CHOLHDL", "LDLDIRECT" in the last 72 hours. Thyroid  Function Tests: No results for input(s): "TSH", "T4TOTAL", "FREET4", "T3FREE", "THYROIDAB" in the last 72 hours. Anemia Panel: No results for input(s): "VITAMINB12", "FOLATE", "FERRITIN", "TIBC", "IRON ", "RETICCTPCT" in the last 72 hours. Sepsis Labs: No results for input(s): "PROCALCITON", "LATICACIDVEN" in the last 168 hours.  Recent Results (from the past 240 hours)  Culture, blood (Routine X 2) w Reflex to ID Panel     Status: None (Preliminary result)   Collection Time: 07/02/23  6:24 PM   Specimen: BLOOD RIGHT ARM  Result Value Ref Range Status   Specimen Description BLOOD RIGHT ARM  Final   Special Requests   Final  Blood Culture results may not be optimal due to an inadequate volume of blood received in culture bottles AEROBIC BOTTLE ONLY   Culture   Final    NO GROWTH 2 DAYS Performed at [facility], [address], [place], [place] [address]    Report Status PENDING  Incomplete  Culture, blood (Routine X 2) w Reflex to ID Panel     Status: None (Preliminary result)   Collection Time: 07/02/23  6:24 PM   Specimen: BLOOD RIGHT ARM  Result Value Ref Range Status   Specimen Description BLOOD RIGHT ARM  Final   Special Requests   Final    Blood Culture results may not be optimal due to an inadequate volume of blood received in culture bottles AEROBIC BOTTLE ONLY   Culture   Final    NO GROWTH 2 DAYS Performed at [facility], [address], [place], [place] [address]    Report Status PENDING  Incomplete  Gastrointestinal Panel by PCR , Stool     Status: Abnormal   Collection Time: 07/02/23  7:47 PM   Specimen: STOOL  Result Value Ref Range Status   Campylobacter species NOT  DETECTED NOT DETECTED Final   Plesimonas shigelloides NOT DETECTED NOT DETECTED Final   Salmonella species NOT DETECTED NOT DETECTED Final   Yersinia enterocolitica NOT DETECTED NOT DETECTED Final   Vibrio species NOT DETECTED NOT DETECTED Final   Vibrio cholerae NOT DETECTED NOT DETECTED Final   Enteroaggregative E coli (EAEC) NOT DETECTED NOT DETECTED Final   Enteropathogenic E coli (EPEC) NOT DETECTED NOT DETECTED Final   Enterotoxigenic E coli (ETEC) NOT DETECTED NOT DETECTED Final   Shiga like toxin producing E coli (STEC) NOT DETECTED NOT DETECTED Final   Shigella/Enteroinvasive E coli (EIEC) NOT DETECTED NOT DETECTED Final   Cryptosporidium NOT DETECTED NOT DETECTED Final   Cyclospora cayetanensis NOT DETECTED NOT DETECTED Final   Entamoeba histolytica NOT DETECTED NOT DETECTED Final   Giardia lamblia NOT DETECTED NOT DETECTED Final   Adenovirus F40/41 NOT DETECTED NOT DETECTED Final   Astrovirus NOT DETECTED NOT DETECTED Final   Norovirus GI/GII DETECTED (A) NOT DETECTED Final    Comment: CRITICAL RESULT CALLED TO, READ BACK BY AND VERIFIED WITH: Lelia Putnam RN @ [phone] 07/03/23 [facility]    Rotavirus A NOT DETECTED NOT DETECTED Final   Sapovirus (I, II, IV, and V) NOT DETECTED NOT DETECTED Final    Comment: Performed at [facility], [address] [place], [place] [address]  C Difficile Quick Screen w PCR reflex     Status: Abnormal   Collection Time: 07/02/23  7:47 PM   Specimen: STOOL  Result Value Ref Range Status   C Diff antigen POSITIVE (A) NEGATIVE Final   C Diff toxin NEGATIVE NEGATIVE Final   C Diff interpretation Results are indeterminate. See PCR results.  Final    Comment: Performed at [facility], [address] [place], [place] [address]  C. Diff by PCR, Reflexed     Status: Abnormal   Collection Time: 07/02/23  7:47 PM  Result Value Ref Range Status   Toxigenic C. Difficile by PCR POSITIVE (A) NEGATIVE Final    Comment: Positive for  toxigenic C. difficile with little to no toxin production. Only treat if clinical presentation suggests symptomatic illness. Performed at [facility], [address], [place], [place] [address]          Radiology Studies: ECHOCARDIOGRAM COMPLETE Result  Date: 07/03/2023    ECHOCARDIOGRAM REPORT   Patient Name:   [name] Date of Exam: 07/03/2023 Medical Rec #:  [id number]           Height:       73.0 in Accession #:    [id number]          Weight:       163.8 lb Date of Birth:  11-05-1965           BSA:          1.977 m Patient Age:    58 years            BP:           113/65 mmHg Patient Gender: M                   HR:           81 bpm. Exam Location:  Inpatient Procedure: 2D Echo, Cardiac Doppler and Color Doppler (Both Spectral and Color            Flow Doppler were utilized during procedure). Indications:    R55 Syncope  History:        Patient has no prior history of Echocardiogram examinations.                 Signs/Symptoms:Syncope; Risk Factors:Hypertension and                 Dyslipidemia. ETOH. H/O substance abuse.  Sonographer:    Raynelle Callow RDCS Referring Phys: [id number] DAVID MANUEL ORTIZ  Sonographer Comments: Technically difficult study due to poor echo windows. IMPRESSIONS  1. Left ventricular ejection fraction, by estimation, is 70 to 75%. The left ventricle has hyperdynamic function. The left ventricle has no regional wall motion abnormalities. There is mild asymmetric left ventricular hypertrophy of the basal-septal segment. Left ventricular diastolic parameters are consistent with Grade I diastolic dysfunction (impaired relaxation).  2. Right ventricular systolic function is normal. The right ventricular size is normal. Tricuspid regurgitation signal is inadequate for assessing PA pressure.  3. The mitral valve is normal in structure. No evidence of mitral valve regurgitation. No evidence of mitral stenosis.  4. The aortic valve was not well visualized. There is mild  calcification of the aortic valve. There is mild thickening of the aortic valve. Aortic valve regurgitation is not visualized. No aortic stenosis is present.  5. The inferior vena cava is normal in size with greater than 50% respiratory variability, suggesting right atrial pressure of 3 mmHg. FINDINGS  Left Ventricle: Left ventricular ejection fraction, by estimation, is 70 to 75%. The left ventricle has hyperdynamic function. The left ventricle has no regional wall motion abnormalities. The left ventricular internal cavity size was normal in size. There is mild asymmetric left ventricular hypertrophy of the basal-septal segment. Left ventricular diastolic parameters are consistent with Grade I diastolic dysfunction (impaired relaxation). Normal left ventricular filling pressure. Right Ventricle: The right ventricular size is normal. Right vetricular wall thickness was not well visualized. Right ventricular systolic function is normal. Tricuspid regurgitation signal is inadequate for assessing PA pressure. Left Atrium: Left atrial size was normal in size. Right Atrium: Right atrial size was normal in size. Pericardium: There is no evidence of pericardial effusion. Mitral Valve: The mitral valve is normal in structure. No evidence of mitral valve regurgitation. No evidence of mitral valve stenosis. Tricuspid Valve: The tricuspid valve is normal in structure. Tricuspid valve regurgitation is not demonstrated. No evidence  of tricuspid stenosis. Aortic Valve: The aortic valve was not well visualized. There is mild calcification of the aortic valve. There is mild thickening of the aortic valve. There is mild aortic valve annular calcification. Aortic valve regurgitation is not visualized. No aortic stenosis is present. Aortic valve mean gradient measures 8.1 mmHg. Aortic valve peak gradient measures 18.2 mmHg. Aortic valve area, by VTI measures 2.49 cm. Pulmonic Valve: The pulmonic valve was not well visualized.  Pulmonic valve regurgitation is not visualized. No evidence of pulmonic stenosis. Aorta: The aortic root and ascending aorta are structurally normal, with no evidence of dilitation. Venous: The inferior vena cava is normal in size with greater than 50% respiratory variability, suggesting right atrial pressure of 3 mmHg. IAS/Shunts: The interatrial septum was not well visualized.  LEFT VENTRICLE PLAX 2D LVIDd:         4.20 cm     Diastology LVIDs:         3.00 cm     LV e' medial:    9.36 cm/s LV PW:         1.00 cm     LV E/e' medial:  7.2 LV IVS:        1.20 cm     LV e' lateral:   11.00 cm/s LVOT diam:     2.50 cm     LV E/e' lateral: 6.1 LV SV:         87 LV SV Index:   44 LVOT Area:     4.91 cm  LV Volumes (MOD) LV vol d, MOD A2C: 84.5 ml LV vol d, MOD A4C: 82.6 ml LV vol s, MOD A2C: 29.8 ml LV vol s, MOD A4C: 28.1 ml LV SV MOD A2C:     54.7 ml LV SV MOD A4C:     82.6 ml LV SV MOD BP:      56.9 ml RIGHT VENTRICLE             IVC RV S prime:     19.00 cm/s  IVC diam: 2.00 cm TAPSE (M-mode): 2.3 cm LEFT ATRIUM             Index        RIGHT ATRIUM          Index LA diam:        4.20 cm 2.12 cm/m   RA Area:     9.61 cm LA Vol (A2C):   54.9 ml 27.78 ml/m  RA Volume:   13.10 ml 6.63 ml/m LA Vol (A4C):   45.4 ml 22.97 ml/m LA Biplane Vol: 52.7 ml 26.66 ml/m  AORTIC VALVE AV Area (Vmax):    2.25 cm AV Area (Vmean):   2.41 cm AV Area (VTI):     2.49 cm AV Vmax:           213.11 cm/s AV Vmean:          132.009 cm/s AV VTI:            0.349 m AV Peak Grad:      18.2 mmHg AV Mean Grad:      8.1 mmHg LVOT Vmax:         97.70 cm/s LVOT Vmean:        64.900 cm/s LVOT VTI:          0.177 m LVOT/AV VTI ratio: 0.51  AORTA Ao Root diam: 3.00 cm Ao Asc diam:  3.30 cm MITRAL VALVE MV Area (PHT): 3.65 cm  SHUNTS MV Decel Time: 208 msec     Systemic VTI:  0.18 m MV E velocity: 67.40 cm/s   Systemic Diam: 2.50 cm MV A velocity: 105.00 cm/s MV E/A ratio:  0.64 Armida Lander MD Electronically signed by Armida Lander MD  Signature Date/Time: 07/03/2023/1:53:38 PM    Final    MR ABDOMEN W WO CONTRAST Result Date: 07/03/2023 CLINICAL DATA:  Primary sclerosing cholangitis. New 3 cm liver lesion. EXAM: MRI ABDOMEN WITHOUT AND WITH CONTRAST TECHNIQUE: Multiplanar multisequence MR imaging of the abdomen was performed both before and after the administration of intravenous contrast. CONTRAST:  7mL GADAVIST  GADOBUTROL  1 MMOL/ML IV SOLN COMPARISON:  Abdomen and pelvis CT 07/02/2023. MRI abdomen 10/31/2021 FINDINGS: Lower chest: Insert lower chest Hepatobiliary: As noted on prior CT, there is atrophy or prior resection in the right hepatic lobe. Lesion in question on CT scan yesterday measures approximately 3.7 x 2.8 x 4.3 cm in the lateral aspect of the hepatic dome and shows heterogeneous enhancement after IV contrast administration. Immediately inferior and posterior to this lesion is a cyst measuring 5.0 x 4.5 cm, increased from 4.2 x 4.3 cm on previous MRI. Between the new heterogeneously enhancing lesion and the dominant cyst is a 1.3 x 1.1 cm cystic lesion (11/7) that is similar to previous MRI. Gallbladder demonstrates distorted anatomy but is otherwise unremarkable. Although not a dedicated MRCP exam, as on previous MR bile duct show areas of variable dilatation in stenosis compatible with the reported clinical history of primary sclerosing cholangitis. Common bile duct in the head of the pancreas is nondilated. Pancreas: No focal mass lesion. No dilatation of the main duct. No intraparenchymal cyst. No peripancreatic edema. Spleen:  No splenomegaly. No suspicious focal mass lesion. Adrenals/Urinary Tract: No adrenal nodule or mass. Kidneys unremarkable. Stomach/Bowel: Stomach is unremarkable. No gastric wall thickening. No evidence of outlet obstruction. Duodenum is normally positioned as is the ligament of Treitz. No small bowel or colonic dilatation within the visualized abdomen. Vascular/Lymphatic: No abdominal aortic aneurysm.  No abdominal lymphadenopathy. Other:  No substantial intraperitoneal free fluid. Musculoskeletal: No focal suspicious marrow enhancement within the visualized bony anatomy. IMPRESSION: 1. Lesion in question on CT scan yesterday measures approximately 3.7 x 2.8 x 4.3 cm in the lateral aspect of the hepatic dome. Lesion shows heterogeneous enhancement after IV contrast administration, suspicious for neoplasm. Given the history of primary sclerosing cholangitis, cholangiocarcinoma would be a distinct concern. Tissue sampling recommended. 2. Immediately inferior and posterior to this lesion is a cyst measuring 5.0 x 4.5 cm, increased from 4.2 x 4.3 cm on previous MRI. 3. Although not a dedicated MRCP exam, as on previous MR, the bile ducts show areas of variable dilatation and stenosis compatible with the reported clinical history of primary sclerosing cholangitis. Electronically Signed   By: Donnal Fusi M.D.   On: 07/03/2023 12:35        Scheduled Meds:  busPIRone   7.5 mg Oral BID   enoxaparin  (LOVENOX ) injection  40 mg Subcutaneous Q24H   mirtazapine   15 mg Oral QHS   predniSONE   30 mg Oral Q breakfast   Ensure Max Protein  237 mL Oral BID   vancomycin   125 mg Oral QID   venlafaxine  XR  150 mg Oral Daily   venlafaxine  XR  75 mg Oral Daily   Continuous Infusions:  sodium chloride  50 mL/hr at 07/05/23 0405          Audria Leather, MD [facility] 07/05/2023, 8:03 AM '

## 2023-07-06 ENCOUNTER — Telehealth: Payer: Self-pay | Admitting: Internal Medicine

## 2023-07-06 DIAGNOSIS — K769 Liver disease, unspecified: Secondary | ICD-10-CM

## 2023-07-06 NOTE — Telephone Encounter (Signed)
 Message left with the patient that I was trying to reach him about his liver mass.

## 2023-07-07 ENCOUNTER — Other Ambulatory Visit: Payer: Self-pay

## 2023-07-07 DIAGNOSIS — F411 Generalized anxiety disorder: Secondary | ICD-10-CM | POA: Diagnosis not present

## 2023-07-07 DIAGNOSIS — F330 Major depressive disorder, recurrent, mild: Secondary | ICD-10-CM | POA: Diagnosis not present

## 2023-07-07 DIAGNOSIS — K769 Liver disease, unspecified: Secondary | ICD-10-CM

## 2023-07-07 LAB — CULTURE, BLOOD (ROUTINE X 2)

## 2023-07-07 NOTE — Telephone Encounter (Signed)
 I was able to speak to the patient and we reviewed that he has a tumor in the liver that I think is suspicious for a bile duct cancer.  He needs a biopsy.  He is willing to proceed now (would not do while hospitalized).  Please put in an order to interventional radiology for ultrasound-guided liver biopsy of liver mass and have that scheduled.  Encounter Diagnosis  Name Primary?   Liver lesion, right lobe Yes   He is still having some diarrhea but it responds to Imodium  so I think his C. difficile diarrhea is better.  He will continue the vancomycin .  He has postponed his Entyvio  infusion until he completes the vancomycin .   I will arrange follow-up pending the liver biopsy results.

## 2023-07-07 NOTE — Telephone Encounter (Signed)
 Order placed for US  guided liver biopsy. Staff message sent for scheduling.

## 2023-07-08 NOTE — Progress Notes (Unsigned)
 Erica Hau, MD  [facility] PROCEDURE / BIOPSY REVIEW Date: 07/08/23  Requested Biopsy site: Liver lesion, right lobe Reason for request: New liver lesion in setting of PSC Imaging review: Best seen on MR and CT  Decision: Approved Imaging modality to perform: Ultrasound Schedule with: Moderate Sedation Schedule for: Any VIR  Additional comments: @VIR : Solid mass anterior and superior to larger cyst in liver. @Schedulers .  Please contact me with questions, concerns, or if issue pertaining to this request arise.  Aileen Alexanders, MD [facility]       Previous Messages    ----- Message ----- From: Derrell Flight Sent: 07/07/2023   1:44 PM EDT To: Derrell Flight; Ir Procedure Requests Subject: US  BIOPSY (LIVER)                              Procedure :US  BIOPSY (LIVER)  Reason :liver lesion right lobe Dx: Liver lesion, right lob  History :MR ABDOMEN W WO CONTRAST,CT ABDOMEN PELVIS WO CONTRAST,DG Chest Port 1 View,CT HEAD WO CONTRAST  Provider: Kenney Peacemaker, MD  Provider contact  : [phone]

## 2023-07-14 DIAGNOSIS — F330 Major depressive disorder, recurrent, mild: Secondary | ICD-10-CM | POA: Diagnosis not present

## 2023-07-14 DIAGNOSIS — F411 Generalized anxiety disorder: Secondary | ICD-10-CM | POA: Diagnosis not present

## 2023-07-20 DIAGNOSIS — R197 Diarrhea, unspecified: Secondary | ICD-10-CM | POA: Diagnosis not present

## 2023-07-20 DIAGNOSIS — Z09 Encounter for follow-up examination after completed treatment for conditions other than malignant neoplasm: Secondary | ICD-10-CM | POA: Diagnosis not present

## 2023-07-20 DIAGNOSIS — R935 Abnormal findings on diagnostic imaging of other abdominal regions, including retroperitoneum: Secondary | ICD-10-CM | POA: Diagnosis not present

## 2023-07-20 DIAGNOSIS — Z6820 Body mass index (BMI) 20.0-20.9, adult: Secondary | ICD-10-CM | POA: Diagnosis not present

## 2023-07-21 DIAGNOSIS — F411 Generalized anxiety disorder: Secondary | ICD-10-CM | POA: Diagnosis not present

## 2023-07-21 DIAGNOSIS — F330 Major depressive disorder, recurrent, mild: Secondary | ICD-10-CM | POA: Diagnosis not present

## 2023-07-26 ENCOUNTER — Encounter (HOSPITAL_COMMUNITY): Payer: Self-pay

## 2023-07-26 ENCOUNTER — Telehealth: Payer: Self-pay | Admitting: Nurse Practitioner

## 2023-07-26 ENCOUNTER — Emergency Department (HOSPITAL_COMMUNITY)

## 2023-07-26 ENCOUNTER — Emergency Department (HOSPITAL_COMMUNITY)
Admission: EM | Admit: 2023-07-26 | Discharge: 2023-07-26 | Disposition: A | Discharge status: Home / Self Care | Attending: Emergency Medicine | Admitting: Emergency Medicine

## 2023-07-26 DIAGNOSIS — R197 Diarrhea, unspecified: Secondary | ICD-10-CM | POA: Diagnosis not present

## 2023-07-26 DIAGNOSIS — E860 Dehydration: Secondary | ICD-10-CM

## 2023-07-26 DIAGNOSIS — M25561 Pain in right knee: Secondary | ICD-10-CM | POA: Diagnosis not present

## 2023-07-26 DIAGNOSIS — R6883 Chills (without fever): Secondary | ICD-10-CM | POA: Insufficient documentation

## 2023-07-26 DIAGNOSIS — I10 Essential (primary) hypertension: Secondary | ICD-10-CM | POA: Diagnosis not present

## 2023-07-26 DIAGNOSIS — W19XXXA Unspecified fall, initial encounter: Secondary | ICD-10-CM | POA: Diagnosis not present

## 2023-07-26 DIAGNOSIS — I959 Hypotension, unspecified: Secondary | ICD-10-CM | POA: Diagnosis not present

## 2023-07-26 LAB — URINALYSIS, ROUTINE W REFLEX MICROSCOPIC
Bacteria, UA: NONE SEEN
Bilirubin Urine: NEGATIVE
Glucose, UA: NEGATIVE mg/dL
Ketones, ur: NEGATIVE mg/dL
Leukocytes,Ua: NEGATIVE
Nitrite: NEGATIVE
Protein, ur: NEGATIVE mg/dL
Specific Gravity, Urine: 1.005 (ref 1.005–1.030)
pH: 6 (ref 5.0–8.0)

## 2023-07-26 LAB — COMPREHENSIVE METABOLIC PANEL WITH GFR
ALT: 88 U/L — ABNORMAL HIGH (ref 0–44)
AST: 39 U/L (ref 15–41)
Albumin: 2.6 g/dL — ABNORMAL LOW (ref 3.5–5.0)
Alkaline Phosphatase: 117 U/L (ref 38–126)
Anion gap: 9 (ref 5–15)
BUN: 20 mg/dL (ref 6–20)
CO2: 25 mmol/L (ref 22–32)
Calcium: 7.9 mg/dL — ABNORMAL LOW (ref 8.9–10.3)
Chloride: 101 mmol/L (ref 98–111)
Creatinine, Ser: 1.38 mg/dL — ABNORMAL HIGH (ref 0.61–1.24)
GFR, Estimated: 59 mL/min — ABNORMAL LOW (ref >60)
Glucose, Bld: 104 mg/dL — ABNORMAL HIGH (ref 70–99)
Potassium: 3.7 mmol/L (ref 3.5–5.1)
Sodium: 135 mmol/L (ref 135–145)
Total Bilirubin: 1 mg/dL (ref 0.0–1.2)
Total Protein: 5.3 g/dL — ABNORMAL LOW (ref 6.5–8.1)

## 2023-07-26 LAB — CBC WITH DIFFERENTIAL/PLATELET
Abs Immature Granulocytes: 0.12 10*3/uL — ABNORMAL HIGH (ref 0.00–0.07)
Basophils Absolute: 0 10*3/uL (ref 0.0–0.1)
Basophils Relative: 0 %
Eosinophils Absolute: 0 10*3/uL (ref 0.0–0.5)
Eosinophils Relative: 0 %
HCT: 45.4 % (ref 39.0–52.0)
Hemoglobin: 15.2 g/dL (ref 13.0–17.0)
Immature Granulocytes: 1 %
Lymphocytes Relative: 3 %
Lymphs Abs: 0.4 10*3/uL — ABNORMAL LOW (ref 0.7–4.0)
MCH: 31.6 pg (ref 26.0–34.0)
MCHC: 33.5 g/dL (ref 30.0–36.0)
MCV: 94.4 fL (ref 80.0–100.0)
Monocytes Absolute: 0.7 10*3/uL (ref 0.1–1.0)
Monocytes Relative: 6 %
Neutro Abs: 10.3 10*3/uL — ABNORMAL HIGH (ref 1.7–7.7)
Neutrophils Relative %: 90 %
Platelets: 121 10*3/uL — ABNORMAL LOW (ref 150–400)
RBC: 4.81 MIL/uL (ref 4.22–5.81)
RDW: 14 % (ref 11.5–15.5)
WBC Morphology: INCREASED
WBC: 11.6 10*3/uL — ABNORMAL HIGH (ref 4.0–10.5)
nRBC: 0 % (ref 0.0–0.2)

## 2023-07-26 LAB — LIPASE, BLOOD: Lipase: 26 U/L (ref 11–51)

## 2023-07-26 LAB — MAGNESIUM: Magnesium: 2 mg/dL (ref 1.7–2.4)

## 2023-07-26 MED ORDER — ONDANSETRON 4 MG PO TBDP
ORAL_TABLET | ORAL | 0 refills | Status: AC
Start: 2023-07-26 — End: ?

## 2023-07-26 MED ORDER — SODIUM CHLORIDE 0.9 % IV BOLUS
1000.0000 mL | Freq: Once | INTRAVENOUS | Status: AC
  Administered 2023-07-26: 1000 mL via INTRAVENOUS

## 2023-07-26 MED ORDER — ONDANSETRON HCL 4 MG/2ML IJ SOLN
4.0000 mg | Freq: Once | INTRAMUSCULAR | Status: AC
  Administered 2023-07-26: 4 mg via INTRAVENOUS
  Filled 2023-07-26: qty 2

## 2023-07-26 MED ORDER — HYDROMORPHONE HCL 1 MG/ML IJ SOLN
1.0000 mg | Freq: Once | INTRAMUSCULAR | Status: AC
  Administered 2023-07-26: 1 mg via INTRAVENOUS
  Filled 2023-07-26: qty 1

## 2023-07-26 MED ORDER — VANCOMYCIN HCL 125 MG PO CAPS: 125.0000 mg | ORAL_CAPSULE | Freq: Four times a day (QID) | ORAL | 0 refills | Status: AC

## 2023-07-26 NOTE — Telephone Encounter (Signed)
 Dr. Willy Harvest, Arlie Lain, I received a call from Dr. Inga Manges [facility] ED, patient presented with recurrent diarrhea x 24 hours. Recently admitted 5/3 - 5/6, CT showed possible colitis and a liver lesion, treated for C. Diff treated with Vancomycin  qid. WBC 11.6. Cr 1.38. T. Bili 1.0. Alk phos 117. AST 39. ALT 88. Receiving IV fluids. Patient did not want to be admitted and Dr. Inga Manges assessed he was ok to discharged home and questioned if patient should be retested for C. Diff or retreated.   C. Diff antigen positive and C. Diff positive on 5/3, during hospital admission. Treated with Vanco 125mg  QID x 2 weeks.  I advised restarting a course of Vanco 125mg  one tab po qid  2 weeks then one tab po tid x 1 week then one tab po bid x 1 week then one tab po every day x 1 week.   Beth/Patty, please contact patient and send him to our lab in one week to recheck a CBC and BMP. Schedule him for a follow up appointment in 2 to 3 weeks with Dr. Willy Harvest or POD C APP.  THX.  Regarding the liver lesion, patient declined liver biopsy during his 06/2023 hospital admission. He is scheduled for an outpatient liver biopsy 5/30

## 2023-07-26 NOTE — ED Triage Notes (Signed)
 Pt. BIB ems from home after falling in the bathroom. Pt states having diarrhea for  x2 days and chills. When he fell he hurt his right knee.

## 2023-07-26 NOTE — Telephone Encounter (Signed)
 Called the patient. No answer. Left message asking he call us  back.

## 2023-07-26 NOTE — ED Notes (Signed)
 Pt given urinal and he is aware that we need urine. He says he doesn't need to go at this time

## 2023-07-26 NOTE — ED Provider Notes (Signed)
 [place] [facility]ERGENCY DEPARTMENT AT [facility] Provider Note   CSN: [id number] Arrival date & time: 07/26/23  1013     History  Chief Complaint  Patient presents with   Diarrhea    X2 days    Chills    [name] is a 58 y.o. male.  58 yo M with a cc of diarrhea. Fatigue.  Going on for the past couple days.  Passed out a couple times today.  Seen recently for this and admitted to the hospital.   Denies any abdominal pain.  When he passed out earlier he did hurt his right knee.  Complaining of pain to the knee especially with bending it.  Denies any other obvious injury.  No fevers or chills.  Has had some nausea but no vomiting.   Diarrhea      Home Medications Prior to Admission medications   Medication Sig Start Date End Date Taking? Authorizing Provider  ondansetron  (ZOFRAN -ODT) 4 MG disintegrating tablet 4mg  ODT q4 hours prn nausea/vomit 07/26/23  Yes Ambrie Carte, DO  vancomycin  (VANCOCIN ) 125 MG capsule Take 1 capsule (125 mg total) by mouth 4 (four) times daily for 14 days. 07/26/23 08/09/23 Yes Alle Difabio, DO  ALAWAY 0.035 % ophthalmic solution Place 1 drop into both eyes 2 (two) times daily as needed (for irritation).    [provider]  busPIRone  (BUSPAR ) 7.5 MG tablet Take 1 tablet (7.5 mg total) by mouth 3 (three) times daily. 07/05/23 08/04/23  Audria Leather, MD  hydrOXYzine  (ATARAX ) 25 MG tablet Take 1 tablet (25 mg total) by mouth 3 (three) times daily as needed for anxiety. Patient taking differently: Take 25 mg by mouth See admin instructions. Take 25 mg by mouth in the morning & afternoon- and an additional 25 mg once a day as needed for anxiety 05/19/23   Silas Drivers, MD  LORazepam  (ATIVAN ) 1 MG tablet Take 1 tablet (1 mg total) by mouth every 4 (four) hours as needed (tremors, psychomotor agitation). 05/19/23   Silas Drivers, MD  mirtazapine  (REMERON ) 30 MG tablet Take 0.5 tablets (15 mg total) by mouth at bedtime. 07/05/23    Audria Leather, MD  Nutritional Supplements (ENSURE HIGH PROTEIN) LIQD Take 237 mLs by mouth See admin instructions. Drink 237 ml's of CHOCOLATE HP Ensure by mouth 2 times a day    [provider]  predniSONE  (DELTASONE ) 20 MG tablet TAKE 1.5 TABLETS (30 MG TOTAL) BY MOUTH DAILY WITH BREAKFAST. 06/22/23   Kenney Peacemaker, MD  Vedolizumab  (ENTYVIO  IV) Inject into the vein every 6 (six) weeks.    [provider]  venlafaxine  XR (EFFEXOR -XR) 150 MG 24 hr capsule Take 1 capsule (150 mg total) by mouth daily with breakfast. Patient taking differently: Take 150 mg by mouth See admin instructions. Take 150 mg by mouth in the morning with breakfast in conjunction with one 75 mg capsule to equal a total dose of 225 mg 05/20/23   Parmar, Meenakshi, MD  venlafaxine  XR (EFFEXOR -XR) 75 MG 24 hr capsule Take 75 mg by mouth See admin instructions. Take 75 mg by mouth in the morning with breakfast in conjunction with one 150 mg capsule to equal a total dose of 225 mg    [provider]      Allergies    Tape, Asacol  [mesalamine ], Azathioprine, Gluten meal, Metoprolol  tartrate, Mycophenolate mofetil, Other, Wheat, Abilify  [aripiprazole ], and Amoxicillin     Review of Systems   Review of Systems  Gastrointestinal:  Positive for  diarrhea.    Physical Exam Updated Vital Signs BP 134/79   Pulse 74   Temp 98.1 F (36.7 C) (Oral)   Resp 18   SpO2 98%  Physical Exam Vitals and nursing note reviewed.  Constitutional:      Appearance: He is well-developed.  HENT:     Head: Normocephalic and atraumatic.  Eyes:     Pupils: Pupils are equal, round, and reactive to light.  Neck:     Vascular: No JVD.  Cardiovascular:     Rate and Rhythm: Normal rate and regular rhythm.     Heart sounds: No murmur heard.    No friction rub. No gallop.  Pulmonary:     Effort: No respiratory distress.     Breath sounds: No wheezing.  Abdominal:     General: There is no distension.     Tenderness:  There is no abdominal tenderness. There is no guarding or rebound.  Musculoskeletal:        General: Normal range of motion.     Cervical back: Normal range of motion and neck supple.     Comments: Patient has some bruising to the medial aspect of the right knee.  There is no obvious joint effusion.  He does have pain with range of motion of the knee and forcibly keeps it extended.  Skin:    Coloration: Skin is not pale.     Findings: No rash.  Neurological:     Mental Status: He is alert and oriented to person, place, and time.  Psychiatric:        Behavior: Behavior normal.     ED Results / Procedures / Treatments   Labs (all labs ordered are listed, but only abnormal results are displayed) Labs Reviewed  CBC WITH DIFFERENTIAL/PLATELET - Abnormal; Notable for the following components:      Result Value   WBC 11.6 (*)    Platelets 121 (*)    Neutro Abs 10.3 (*)    Lymphs Abs 0.4 (*)    Abs Immature Granulocytes 0.12 (*)    All other components within normal limits  COMPREHENSIVE METABOLIC PANEL WITH GFR - Abnormal; Notable for the following components:   Glucose, Bld 104 (*)    Creatinine, Ser 1.38 (*)    Calcium 7.9 (*)    Total Protein 5.3 (*)    Albumin 2.6 (*)    ALT 88 (*)    GFR, Estimated 59 (*)    All other components within normal limits  URINALYSIS, ROUTINE W REFLEX MICROSCOPIC - Abnormal; Notable for the following components:   Color, Urine STRAW (*)    Hgb urine dipstick SMALL (*)    All other components within normal limits  LIPASE, BLOOD  MAGNESIUM     EKG None  Radiology DG Knee Complete 4 Views Right Result Date: 07/26/2023 CLINICAL DATA:  Pain after fall. EXAM: RIGHT KNEE - COMPLETE 4+ VIEW COMPARISON:  None Available. FINDINGS: No evidence of acute fracture, dislocation, or joint effusion. No evidence of significant arthropathy. Vascular calcifications are noted. No significant focal soft tissue swelling. IMPRESSION: No acute osseous abnormality.  Electronically Signed   By: Mannie Seek M.D.   On: 07/26/2023 14:15    Procedures Procedures    Medications Ordered in ED Medications  sodium chloride  0.9 % bolus 1,000 mL (0 mLs Intravenous Stopped 07/26/23 1238)  ondansetron  (ZOFRAN ) injection 4 mg (4 mg Intravenous Given 07/26/23 1056)  HYDROmorphone  (DILAUDID ) injection 1 mg (1 mg Intravenous Given 07/26/23 1055)  ED Course/ Medical Decision Making/ A&P                                 Medical Decision Making Amount and/or Complexity of Data Reviewed Labs: ordered. Radiology: ordered.  Risk Prescription drug management.   58 yo M with a significant past medical history of Crohn's comes in with a chief complaints of diarrhea and fatigue.  Patient with multiple events where he lost consciousness earlier today.  Likely due to hypovolemia based on history.  On record review the patient was actually admitted for this a couple weeks ago.  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  He has not yet had this followed up as an outpatient.  Will obtain a laboratory evaluation.  Attempt to treat symptoms.  Plain film of the right knee.  Reassess.  Plain film of the right knee independently interpreted by me without fracture or dislocation.  Blood work with acute kidney injury.  No significant electrolyte abnormalities otherwise.  No acute anemia.  I discussed results with patient.  He is worried that he has perhaps recontracted C. difficile.  I discussed this with gastroenterology, Coye Diver she recommended retreatment with vancomycin  but a more prolonged course.  Plans to see him back in the clinic.  2:27 PM:  I have discussed the diagnosis/risks/treatment options with the patient.  Evaluation and diagnostic testing in the emergency department does not suggest an emergent condition requiring admission or immediate intervention beyond what has been performed at this time.  They will follow up with PCP. We also discussed  returning to the ED immediately if new or worsening sx occur. We discussed the sx which are most concerning (e.g., sudden worsening pain, fever, inability to tolerate by mouth) that necessitate immediate return. Medications administered to the patient during their visit and any new prescriptions provided to the patient are listed below.  Medications given during this visit Medications  sodium chloride  0.9 % bolus 1,000 mL (0 mLs Intravenous Stopped 07/26/23 1238)  ondansetron  (ZOFRAN ) injection 4 mg (4 mg Intravenous Given 07/26/23 1056)  HYDROmorphone  (DILAUDID ) injection 1 mg (1 mg Intravenous Given 07/26/23 1055)     The patient appears reasonably screen and/or stabilized for discharge and I doubt any other medical condition or other [facility] requiring further screening, evaluation, or treatment in the ED at this time prior to discharge.          Final Clinical Impression(s) / ED Diagnoses Final diagnoses:  None    Rx / DC Orders ED Discharge Orders          Ordered    vancomycin  (VANCOCIN ) 125 MG capsule  4 times daily        07/26/23 1405    ondansetron  (ZOFRAN -ODT) 4 MG disintegrating tablet        07/26/23 1405              Albertus Hughs, DO 07/26/23 1427

## 2023-07-26 NOTE — Progress Notes (Signed)
 Orthopedic Tech Progress Note Patient Details:  [name] [birth year] [id number]  Ortho Devices Type of Ortho Device: Crutches Ortho Device/Splint Interventions: Ordered, Application, Adjustment   Post Interventions Patient Tolerated: Well, Ambulated well Instructions Provided: Care of device, Adjustment of device  Leodis Rainwater 07/26/2023, 2:41 PM

## 2023-07-26 NOTE — Discharge Instructions (Addendum)
 Return for worsening pain, fever, inability to eat or drink

## 2023-07-27 ENCOUNTER — Encounter: Payer: Self-pay | Admitting: Internal Medicine

## 2023-07-27 ENCOUNTER — Other Ambulatory Visit: Payer: Self-pay

## 2023-07-27 DIAGNOSIS — D500 Iron deficiency anemia secondary to blood loss (chronic): Secondary | ICD-10-CM

## 2023-07-27 DIAGNOSIS — K50813 Crohn's disease of both small and large intestine with fistula: Secondary | ICD-10-CM

## 2023-07-27 MED ORDER — VANCOMYCIN HCL 125 MG PO CAPS: ORAL_CAPSULE | ORAL | 0 refills | Status: DC

## 2023-07-27 NOTE — Telephone Encounter (Signed)
 Spoke with the patient. Advised patient of the plan of care. Prescription for vancomycin  taper sent to the pharmacy. Patient instructed to begin the taper on day 15 and decrease by 1 capsule every 7 days as per SIG. He agrees to come in for labs next week.  Agrees to a follow up appointment with Dr Willy Harvest 08/15/23 at 11:30 am, arriving at 11:20 am. No questions at the end of the call.

## 2023-07-27 NOTE — Addendum Note (Signed)
 Addended by: Heber Little A on: 07/27/2023 12:33 PM   Modules accepted: Orders

## 2023-07-27 NOTE — Telephone Encounter (Signed)
 Beth,  You can give him appt w/ me 6/16 at 1130 please  He is going to need the tapering vancomycin  rx sent in - he was rxed 2 weeks from the ED but not the additional taper as indicated by [facility] so please rx that and explain to patient

## 2023-07-27 NOTE — Telephone Encounter (Signed)
 Orders for the labs placed under Dr Willy Harvest.

## 2023-07-28 ENCOUNTER — Other Ambulatory Visit: Payer: Self-pay | Admitting: Student

## 2023-07-28 DIAGNOSIS — D72829 Elevated white blood cell count, unspecified: Secondary | ICD-10-CM

## 2023-07-28 DIAGNOSIS — E43 Unspecified severe protein-calorie malnutrition: Secondary | ICD-10-CM

## 2023-07-28 NOTE — Progress Notes (Signed)
 Chief Complaint: Right lobe liver lesion; request for image guided liver lesion biopsy  Referring Provider(s): Gessner,Carl E   Supervising Physician: Marland Silvas  Patient Status: [facility] - Out-pt  History of Present Illness: [name] is a 58 y.o. male with PMH significant for alcohol abuse, basal cell carcinoma excision (L leg), autoimmune hepatitis, anal fistula, Crohn's disease, C. difficile colitis, depression, portal vein thrombosis, primary sclerosing cholangitis. He presented 5/3 to ED with multiple episodes of diarrhea with dizziness and fall and near syncope. He was hospitalized, and CT of abdomen and pelvis showed possible colitis and noncystic lesion in the liver. Follow up 5/4 MRI suspicious for cholangiocarcinoma: Atrophy or prior resection in the right hepatic lobe. Lesion in question on CT scan yesterday measures approximately 3.7 x 2.8 x 4.3 cm in the lateral aspect of the hepatic dome and shows heterogeneous enhancement after IV contrast administration. Immediately inferior and posterior to this lesion is a cyst measuring 5.0 x 4.5 cm, increased from 4.2 x 4.3 cm on previous MRI. Between the new heterogeneously enhancing lesion and the dominant cyst is a 1.3 x 1.1 cm cystic lesion (11/7) that is similar to previous MRI.  He was started on antibiotics and GI was consulted. Patient refused liver biopsy which was strongly recommended by while inpatient. He was discharged home but was since seen in ER for continued diarrhea, was directed to extend vancomycin  given h/o c. Diff. He has not yet had formal GI OP f/u. He now returns for image guided liver lesion biopsy, approved by Dr. Nereida Banning.  Confirms NPO since MN *** and ride/supervision available for 24 hours.  Does not wear CPAP or use supplemental home O2 ***  Denies fever, chills, SOB, CP, sore throat, N/V, abd pain, blood in stool or urine, abnormal bruising, leg swelling, back pain. Endorses  diarrhea.  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 06/15/2018   Cancer of skin of neck    Cataract    Chronic mesenteric ischemia ([name])    Chronic pain syndrome 07/09/2016   Crohn's disease of small and large intestines ([name])    followed by dr Gorman Laughter gessner   Dairy product intolerance    Diarrhea, functional    Family history of adverse reaction to anesthesia    Grover's disease    transient acantholytic dermatosis   History of alcohol abuse    History of basal cell carcinoma excision    2013 left leg   History of Clostridium difficile    10/ 2014   History of multiple concussions    x6   last one Jan 2017 per pt--  no residual   History of substance abuse ([name])    quit 1997 per pt   History of suicide attempt    05-18-2012  overdose /  failure to thrive   Iron  deficiency anemia due to chronic blood loss    Major depression, recurrent, chronic ([name])    Osteopenia    Personal history of adenomatous colonic polyps 12/2010, 03/2012   12/2010 - 8 mm serrated  adenoma of rectum   Portal vein thrombosis 03/21/2015   right   Primary sclerosing cholangitis    ? 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   Substance abuse ([name]) 1997   Alcohol   Vitamin A  deficiency 05/23/2018   Vitamin B6 deficiency 07/27/2018   Vitamin C deficiency 05/23/2018    Past Surgical History:  Procedure Laterality Date   ABDOMINAL AORTAGRAM N/A 03/29/2012   Procedure: ABDOMINAL Tommi Fraise;  Surgeon: Margherita Shell, MD;  Location: [facility] CATH LAB;  Service: Cardiovascular;  Laterality: N/A;   ADENOIDECTOMY  age 6   BIOPSY  05/31/2018   Procedure: BIOPSY;  Surgeon: Janel Medford, MD;  Location: [facility] ENDOSCOPY;  Service: Endoscopy;;   CATARACT EXTRACTION W/ INTRAOCULAR LENS  IMPLANT, BILATERAL  2009   COLONOSCOPY  2001, 05/02/2003, 01/28/11   2012: Right colon Crohn's, rectal polyp   COLONOSCOPY  03/31/2012   Procedure: COLONOSCOPY;  Surgeon: Nannette Babe, MD;  Location: [facility] ENDOSCOPY;  Service: Gastroenterology;  Laterality: N/A;   COLONOSCOPY WITH PROPOFOL  N/A 05/31/2018   Procedure: COLONOSCOPY WITH PROPOFOL ;  Surgeon: Janel Medford, MD;  Location: [facility] ENDOSCOPY;  Service: Endoscopy;  Laterality: N/A;   ESOPHAGOGASTRODUODENOSCOPY  01/28/2011   Normal   FOOT SURGERY Right age 28   MOHS SURGERY Left 11/2013   left ankle parakerotosis    PERCUTANEOUS LIVER BIOPSY  2007 and 2008   PILONIDAL CYST EXCISION  age 58   PLACEMENT OF SETON N/A 11/06/2015   Procedure: PLACEMENT OF SETON;  Surgeon: Joyce Nixon, MD;  Location: [facility];  Service: General;  Laterality: N/A;   PLACEMENT OF SETON  07/2018   at [facility]   RECTAL EXAM UNDER ANESTHESIA N/A 01/18/2019   Procedure: ANAL EXAM UNDER ANESTHESIA,  INCISION AND DRAINAGE, SETON PLACEMENT;  Surgeon: Joyce Nixon, MD;  Location: [facility] [place];  Service: General;  Laterality: N/A;   UPPER GASTROINTESTINAL ENDOSCOPY        Medications: Prior to Admission medications   Medication Sig Start Date End Date Taking? Authorizing Provider  ALAWAY 0.035 % ophthalmic solution Place 1 drop into both eyes 2 (two) times daily as needed (for irritation).    [provider]  busPIRone  (BUSPAR ) 7.5 MG tablet Take 1 tablet (7.5 mg total) by mouth 3 (three) times daily. 07/05/23 08/04/23  Audria Leather, MD  hydrOXYzine  (ATARAX ) 25 MG tablet Take 1 tablet (25 mg total) by mouth 3 (three) times daily as needed for anxiety. Patient  taking differently: Take 25 mg by mouth See admin instructions. Take 25 mg by mouth in the morning & afternoon- and an additional 25 mg once a day as needed for anxiety 05/19/23   Silas Drivers, MD  LORazepam  (ATIVAN ) 1 MG tablet Take 1 tablet (1 mg total) by mouth every 4 (four) hours as needed (tremors, psychomotor agitation). 05/19/23   Silas Drivers, MD  mirtazapine  (REMERON ) 30 MG tablet Take 0.5 tablets (15 mg total) by mouth at bedtime. 07/05/23   Audria Leather, MD  Nutritional Supplements (ENSURE HIGH PROTEIN) LIQD Take 237 mLs by mouth See admin instructions. Drink 237 ml's of CHOCOLATE HP Ensure by mouth 2 times a day    [provider]  ondansetron  (ZOFRAN -ODT) 4 MG disintegrating tablet 4mg  ODT q4 hours prn nausea/vomit 07/26/23   Floyd, Dan, DO  predniSONE  (DELTASONE ) 20 MG tablet TAKE 1.5 TABLETS (30 MG TOTAL) BY MOUTH DAILY WITH  BREAKFAST. 06/22/23   Kenney Peacemaker, MD  vancomycin  (VANCOCIN ) 125 MG capsule Take 1 capsule (125 mg total) by mouth 4 (four) times daily for 14 days. 07/26/23 08/09/23  Albertus Hughs, DO  vancomycin  (VANCOCIN ) 125 MG capsule On day 15 take 1 capsule TID x 7 days then BID x 7 days then daily x 7 days 07/27/23   Kenney Peacemaker, MD  Vedolizumab  (ENTYVIO  IV) Inject into the vein every 6 (six) weeks.    [provider]  venlafaxine  XR (EFFEXOR -XR) 150 MG 24 hr capsule Take 1 capsule (150 mg total) by mouth daily with breakfast. Patient taking differently: Take 150 mg by mouth See admin instructions. Take 150 mg by mouth in the morning with breakfast in conjunction with one 75 mg capsule to equal a total dose of 225 mg 05/20/23   Silas Drivers, MD  venlafaxine  XR (EFFEXOR -XR) 75 MG 24 hr capsule Take 75 mg by mouth See admin instructions. Take 75 mg by mouth in the morning with breakfast in conjunction with one 150 mg capsule to equal a total dose of 225 mg    [provider]     Family History  Problem Relation Age of Onset   Heart  disease Father    Thyroid  cancer Father    Allergies Father    Clotting disorder Father    Breast cancer Mother    Stomach cancer Mother    Colon cancer Neg Hx    Esophageal cancer Neg Hx    Rectal cancer Neg Hx     Social History   Socioeconomic History   Marital status: Single    Spouse name: Not on file   Number of children: 1   Years of education: Not on file   Highest education level: Not on file  Occupational History   Occupation: Disability   Tobacco Use   Smoking status: Former    Current packs/day: 0.00    Average packs/day: 1 pack/day for 15.0 years (15.0 ttl pk-yrs)    Types: Cigarettes    Start date: 03/28/1982    Quit date: 03/28/1997    Years since quitting: 26.3   Smokeless tobacco: Never  Vaping Use   Vaping status: Never Used  Substance and Sexual Activity   Alcohol use: No    Alcohol/week: 0.0 standard drinks of alcohol   Drug use: Not Currently    Types: Cocaine, Marijuana    Comment: QUIT USING DRUGS IN 1997   Sexual activity: Not Currently  Other Topics Concern   Not on file  Social History Narrative   Single, history of substance abuse in recovery   Previously occupied on medical disability   1 child  Daughter Wellsite geologist, lives and works in [place]   Elderly parents involved and he helps them   1 brother   Social Drivers of Corporate investment banker Strain: Low Risk  (08/28/2019)   Overall Financial Resource Strain (CARDIA)    Difficulty of Paying Living Expenses: Not hard at all  Food Insecurity: No Food Insecurity (07/02/2023)   Hunger Vital Sign    Worried About Running Out of Food in the Last Year: Never true    Ran Out of Food in the Last Year: Never true  Recent Concern: Food Insecurity - Food Insecurity Present (04/13/2023)   Hunger Vital Sign    Worried About Running Out of Food in the Last Year: Never true    Ran Out of Food in the Last Year: Sometimes true  Transportation Needs: No Transportation Needs (07/02/2023)   PRAPARE -  Administrator, Civil Service (Medical): No    Lack of Transportation (Non-Medical): No  Physical Activity: Insufficiently Active (11/08/2019)   Exercise Vital Sign    Days of Exercise per Week: 3 days    Minutes of Exercise per Session: 30 min  Stress: Stress Concern Present (08/28/2019)   [employer] of Occupational Health - Occupational Stress Questionnaire    Feeling of Stress : Very much  Social Connections: Socially Isolated (07/02/2023)   Social Connection and Isolation Panel [NHANES]    Frequency of Communication with Friends and Family: Once a week    Frequency of Social Gatherings with Friends and Family: Once a week    Attends Religious Services: Never    Database administrator or Organizations: No    Attends Banker Meetings: Never    Marital Status: Never married     Review of Systems: A 12 point ROS discussed and pertinent positives are indicated in the HPI above.  All other systems are negative.  Review of Systems  Vital Signs: There were no vitals taken for this visit.    Physical Exam  Imaging: DG Knee Complete 4 Views Right Result Date: 07/26/2023 CLINICAL DATA:  Pain after fall. EXAM: RIGHT KNEE - COMPLETE 4+ VIEW COMPARISON:  None Available. FINDINGS: No evidence of acute fracture, dislocation, or joint effusion. No evidence of significant arthropathy. Vascular calcifications are noted. No significant focal soft tissue swelling. IMPRESSION: No acute osseous abnormality. Electronically Signed   By: Mannie Seek M.D.   On: 07/26/2023 14:15   ECHOCARDIOGRAM COMPLETE Result Date: 07/03/2023    ECHOCARDIOGRAM REPORT   Patient Name:   [name] Date of Exam: 07/03/2023 Medical Rec #:  [id number]           Height:       73.0 in Accession #:    [id number]          Weight:       163.8 lb Date of Birth:  08-Feb-1966           BSA:          1.977 m Patient Age:    58 years            BP:           113/65 mmHg Patient Gender: M                    HR:           81 bpm. Exam Location:  Inpatient Procedure: 2D Echo, Cardiac Doppler and Color Doppler (Both Spectral and Color            Flow Doppler were utilized during procedure). Indications:    R55 Syncope  History:        Patient has no prior history of Echocardiogram examinations.                 Signs/Symptoms:Syncope; Risk Factors:Hypertension and                 Dyslipidemia. ETOH. H/O substance abuse.  Sonographer:    Raynelle Callow RDCS Referring Phys: [id number] DAVID MANUEL ORTIZ  Sonographer Comments: Technically difficult study due to poor echo windows. IMPRESSIONS  1. Left ventricular ejection fraction, by estimation, is 70 to 75%. The left ventricle has hyperdynamic function. The left ventricle has no regional wall motion abnormalities. There is mild asymmetric left ventricular  hypertrophy of the basal-septal segment. Left ventricular diastolic parameters are consistent with Grade I diastolic dysfunction (impaired relaxation).  2. Right ventricular systolic function is normal. The right ventricular size is normal. Tricuspid regurgitation signal is inadequate for assessing PA pressure.  3. The mitral valve is normal in structure. No evidence of mitral valve regurgitation. No evidence of mitral stenosis.  4. The aortic valve was not well visualized. There is mild calcification of the aortic valve. There is mild thickening of the aortic valve. Aortic valve regurgitation is not visualized. No aortic stenosis is present.  5. The inferior vena cava is normal in size with greater than 50% respiratory variability, suggesting right atrial pressure of 3 mmHg. FINDINGS  Left Ventricle: Left ventricular ejection fraction, by estimation, is 70 to 75%. The left ventricle has hyperdynamic function. The left ventricle has no regional wall motion abnormalities. The left ventricular internal cavity size was normal in size. There is mild asymmetric left ventricular hypertrophy of the basal-septal segment. Left  ventricular diastolic parameters are consistent with Grade I diastolic dysfunction (impaired relaxation). Normal left ventricular filling pressure. Right Ventricle: The right ventricular size is normal. Right vetricular wall thickness was not well visualized. Right ventricular systolic function is normal. Tricuspid regurgitation signal is inadequate for assessing PA pressure. Left Atrium: Left atrial size was normal in size. Right Atrium: Right atrial size was normal in size. Pericardium: There is no evidence of pericardial effusion. Mitral Valve: The mitral valve is normal in structure. No evidence of mitral valve regurgitation. No evidence of mitral valve stenosis. Tricuspid Valve: The tricuspid valve is normal in structure. Tricuspid valve regurgitation is not demonstrated. No evidence of tricuspid stenosis. Aortic Valve: The aortic valve was not well visualized. There is mild calcification of the aortic valve. There is mild thickening of the aortic valve. There is mild aortic valve annular calcification. Aortic valve regurgitation is not visualized. No aortic stenosis is present. Aortic valve mean gradient measures 8.1 mmHg. Aortic valve peak gradient measures 18.2 mmHg. Aortic valve area, by VTI measures 2.49 cm. Pulmonic Valve: The pulmonic valve was not well visualized. Pulmonic valve regurgitation is not visualized. No evidence of pulmonic stenosis. Aorta: The aortic root and ascending aorta are structurally normal, with no evidence of dilitation. Venous: The inferior vena cava is normal in size with greater than 50% respiratory variability, suggesting right atrial pressure of 3 mmHg. IAS/Shunts: The interatrial septum was not well visualized.  LEFT VENTRICLE PLAX 2D LVIDd:         4.20 cm     Diastology LVIDs:         3.00 cm     LV e' medial:    9.36 cm/s LV PW:         1.00 cm     LV E/e' medial:  7.2 LV IVS:        1.20 cm     LV e' lateral:   11.00 cm/s LVOT diam:     2.50 cm     LV E/e' lateral: 6.1  LV SV:         87 LV SV Index:   44 LVOT Area:     4.91 cm  LV Volumes (MOD) LV vol d, MOD A2C: 84.5 ml LV vol d, MOD A4C: 82.6 ml LV vol s, MOD A2C: 29.8 ml LV vol s, MOD A4C: 28.1 ml LV SV MOD A2C:     54.7 ml LV SV MOD A4C:     82.6 ml LV SV  MOD BP:      56.9 ml RIGHT VENTRICLE             IVC RV S prime:     19.00 cm/s  IVC diam: 2.00 cm TAPSE (M-mode): 2.3 cm LEFT ATRIUM             Index        RIGHT ATRIUM          Index LA diam:        4.20 cm 2.12 cm/m   RA Area:     9.61 cm LA Vol (A2C):   54.9 ml 27.78 ml/m  RA Volume:   13.10 ml 6.63 ml/m LA Vol (A4C):   45.4 ml 22.97 ml/m LA Biplane Vol: 52.7 ml 26.66 ml/m  AORTIC VALVE AV Area (Vmax):    2.25 cm AV Area (Vmean):   2.41 cm AV Area (VTI):     2.49 cm AV Vmax:           213.11 cm/s AV Vmean:          132.009 cm/s AV VTI:            0.349 m AV Peak Grad:      18.2 mmHg AV Mean Grad:      8.1 mmHg LVOT Vmax:         97.70 cm/s LVOT Vmean:        64.900 cm/s LVOT VTI:          0.177 m LVOT/AV VTI ratio: 0.51  AORTA Ao Root diam: 3.00 cm Ao Asc diam:  3.30 cm MITRAL VALVE MV Area (PHT): 3.65 cm     SHUNTS MV Decel Time: 208 msec     Systemic VTI:  0.18 m MV E velocity: 67.40 cm/s   Systemic Diam: 2.50 cm MV A velocity: 105.00 cm/s MV E/A ratio:  0.64 Armida Lander MD Electronically signed by Armida Lander MD Signature Date/Time: 07/03/2023/1:53:38 PM    Final    MR ABDOMEN W WO CONTRAST Result Date: 07/03/2023 CLINICAL DATA:  Primary sclerosing cholangitis. New 3 cm liver lesion. EXAM: MRI ABDOMEN WITHOUT AND WITH CONTRAST TECHNIQUE: Multiplanar multisequence MR imaging of the abdomen was performed both before and after the administration of intravenous contrast. CONTRAST:  7mL GADAVIST  GADOBUTROL  1 MMOL/ML IV SOLN COMPARISON:  Abdomen and pelvis CT 07/02/2023. MRI abdomen 10/31/2021 FINDINGS: Lower chest: Insert lower chest Hepatobiliary: As noted on prior CT, there is atrophy or prior resection in the right hepatic lobe. Lesion in question on  CT scan yesterday measures approximately 3.7 x 2.8 x 4.3 cm in the lateral aspect of the hepatic dome and shows heterogeneous enhancement after IV contrast administration. Immediately inferior and posterior to this lesion is a cyst measuring 5.0 x 4.5 cm, increased from 4.2 x 4.3 cm on previous MRI. Between the new heterogeneously enhancing lesion and the dominant cyst is a 1.3 x 1.1 cm cystic lesion (11/7) that is similar to previous MRI. Gallbladder demonstrates distorted anatomy but is otherwise unremarkable. Although not a dedicated MRCP exam, as on previous MR bile duct show areas of variable dilatation in stenosis compatible with the reported clinical history of primary sclerosing cholangitis. Common bile duct in the head of the pancreas is nondilated. Pancreas: No focal mass lesion. No dilatation of the main duct. No intraparenchymal cyst. No peripancreatic edema. Spleen:  No splenomegaly. No suspicious focal mass lesion. Adrenals/Urinary Tract: No adrenal nodule or mass. Kidneys unremarkable. Stomach/Bowel: Stomach is unremarkable. No gastric wall  thickening. No evidence of outlet obstruction. Duodenum is normally positioned as is the ligament of Treitz. No small bowel or colonic dilatation within the visualized abdomen. Vascular/Lymphatic: No abdominal aortic aneurysm. No abdominal lymphadenopathy. Other:  No substantial intraperitoneal free fluid. Musculoskeletal: No focal suspicious marrow enhancement within the visualized bony anatomy. IMPRESSION: 1. Lesion in question on CT scan yesterday measures approximately 3.7 x 2.8 x 4.3 cm in the lateral aspect of the hepatic dome. Lesion shows heterogeneous enhancement after IV contrast administration, suspicious for neoplasm. Given the history of primary sclerosing cholangitis, cholangiocarcinoma would be a distinct concern. Tissue sampling recommended. 2. Immediately inferior and posterior to this lesion is a cyst measuring 5.0 x 4.5 cm, increased from 4.2 x  4.3 cm on previous MRI. 3. Although not a dedicated MRCP exam, as on previous MR, the bile ducts show areas of variable dilatation and stenosis compatible with the reported clinical history of primary sclerosing cholangitis. Electronically Signed   By: Donnal Fusi M.D.   On: 07/03/2023 12:35   CT ABDOMEN PELVIS WO CONTRAST Result Date: 07/02/2023 CLINICAL DATA:  Abdominal pain. Nonlocalized. History of primary sclerosing cholangitis. Stomach is unremarkable. No gastric wall thickening. No evidence of outlet obstruction. Duodenum is normally positioned as is the ligament of Treitz. EXAM: CT ABDOMEN AND PELVIS WITHOUT CONTRAST TECHNIQUE: Multidetector CT imaging of the abdomen and pelvis was performed following the standard protocol without IV contrast. RADIATION DOSE REDUCTION: This exam was performed according to the departmental dose-optimization program which includes automated exposure control, adjustment of the mA and/or kV according to patient size and/or use of iterative reconstruction technique. COMPARISON:  MRI 10/31/2021.  CT 07/20/2018. FINDINGS: Lower chest: Dependent atelectasis in both lung bases. Hepatobiliary: Stable deformity of the liver with atrophy of right hepatic parenchyma. Lobular contour of the left liver is similar. 4.7 cm hepatic cyst was 4.3 cm on previous MRI. There is a new 3.7 x 2.8 cm non cystic lesion in the dome of the liver (19/2) not present on the prior studies. Tiny calcified gallstone evident. No intrahepatic or extrahepatic biliary dilation. Pancreas: No focal mass lesion. No dilatation of the main duct. No intraparenchymal cyst. No peripancreatic edema. Spleen: No splenomegaly. No suspicious focal mass lesion. Adrenals/Urinary Tract: No adrenal nodule or mass. Kidneys unremarkable. No evidence for hydroureter. The urinary bladder appears normal for the degree of distention. Stomach/Bowel: Stomach is unremarkable. No gastric wall thickening. No evidence of outlet  obstruction. Duodenum is normally positioned as is the ligament of Treitz. No small bowel wall thickening. No small bowel dilatation. The terminal ileum is normal. The appendix is not well visualized, but there is no edema or inflammation in the region of the cecal tip to suggest appendicitis. No gross colonic mass. No colonic wall thickening. Diverticular changes are noted in the left colon. There is mild ill definition of the wall of the descending colon with subtle pericolonic edema in the same region. Vascular/Lymphatic: There is moderate atherosclerotic calcification of the abdominal aorta without aneurysm. There is no gastrohepatic or hepatoduodenal ligament lymphadenopathy. No retroperitoneal or mesenteric lymphadenopathy. No pelvic sidewall lymphadenopathy. Reproductive: The prostate gland and seminal vesicles are unremarkable. Other: No intraperitoneal free fluid. Musculoskeletal: No worrisome lytic or sclerotic osseous abnormality. IMPRESSION: 1. Mild ill definition of the wall of the descending colon with subtle pericolonic edema in the same region. Imaging features suggest colitis. 2. New 3.7 x 2.8 cm non-cystic lesion in the dome of the liver not present on the prior studies. Given the history  of primary sclerosing cholangitis, close follow-up and further evaluation warranted to exclude cholangiocarcinoma. MRI abdomen with and without contrast recommended to further evaluate and could be performed on a short term nonemergent outpatient basis. 3. Cholelithiasis. 4.  Aortic Atherosclerosis (ICD10-I70.0). Electronically Signed   By: Donnal Fusi M.D.   On: 07/02/2023 11:45    Labs:  CBC: Recent Labs    07/03/23 0700 07/04/23 0510 07/05/23 0555 07/26/23 1059  WBC 20.0* 12.2* 9.1 11.6*  HGB 13.7 14.6 16.0 15.2  HCT 41.7 44.8 48.4 45.4  PLT 112* 122* 145* 121*    COAGS: Recent Labs    08/24/22 1043 11/11/22 1340 07/04/23 1634  INR 1.2* 1.3* 1.0    BMP: Recent Labs     07/03/23 0700 07/04/23 0510 07/05/23 0555 07/26/23 1059  NA 144 142 142 135  K 3.5 3.4* 3.8 3.7  CL 116* 110 107 101  CO2 22 27 25 25   GLUCOSE 64* 79 68* 104*  BUN 16 16 12 20   CALCIUM 7.3* 8.1* 8.4* 7.9*  CREATININE 1.02 0.99 0.93 1.38*  GFRNONAA >60 >60 >60 59*    LIVER FUNCTION TESTS: Recent Labs    07/03/23 0700 07/04/23 0510 07/05/23 0555 07/26/23 1059  BILITOT 0.6 0.6 0.9 1.0  AST 28 45* 40 39  ALT 66* 84* 79* 88*  ALKPHOS 113 157* 162* 117  PROT 4.5* 5.3* 5.6* 5.3*  ALBUMIN 2.1* 2.5* 2.7* 2.6*    TUMOR MARKERS: Recent Labs    08/24/22 1043  AFPTM 2.4    Assessment and Plan:  Request for  image guided liver lesion biopsy No contraindications for procedure identified in ROS, physical exam, or review of pre-sedation considerations. *** Labs reviewed and within acceptable range  5/4 MR imaging available and reviewed *** VSS, afebrile *** Patient does not take a blood thinner  Abx not indicated for percutaneous biopsy    Risks and benefits of image guided liver lesion biopsy was discussed with the patient and/or patient's family including, but not limited to bleeding potentially requiring additional procedure and hospitalization, infection, damage to adjacent structures or low yield requiring additional tests.  All of the questions were answered and there is agreement to proceed.  Consent signed and in chart.   Thank you for allowing our service to participate in [name] 's care.    Electronically Signed: Terressa Fess, NP   07/28/2023, 2:13 PM     I spent a total of  30 Minutes   in face to face in clinical consultation, greater than 50% of which was counseling/coordinating care for image guided liver lesion biopsy   (A copy of this note was sent to the referring provider and the time of visit.)

## 2023-07-29 ENCOUNTER — Ambulatory Visit (HOSPITAL_COMMUNITY)
Admission: RE | Admit: 2023-07-29 | Discharge: 2023-07-29 | Disposition: A | Discharge status: Home / Self Care | Source: Ambulatory Visit | Attending: Internal Medicine | Admitting: Internal Medicine

## 2023-07-29 DIAGNOSIS — K50813 Crohn's disease of both small and large intestine with fistula: Secondary | ICD-10-CM | POA: Diagnosis not present

## 2023-07-29 DIAGNOSIS — E43 Unspecified severe protein-calorie malnutrition: Secondary | ICD-10-CM | POA: Insufficient documentation

## 2023-07-29 DIAGNOSIS — K8301 Primary sclerosing cholangitis: Secondary | ICD-10-CM | POA: Insufficient documentation

## 2023-07-29 DIAGNOSIS — K769 Liver disease, unspecified: Secondary | ICD-10-CM | POA: Insufficient documentation

## 2023-07-29 DIAGNOSIS — K754 Autoimmune hepatitis: Secondary | ICD-10-CM | POA: Diagnosis not present

## 2023-07-29 DIAGNOSIS — Z86718 Personal history of other venous thrombosis and embolism: Secondary | ICD-10-CM | POA: Diagnosis not present

## 2023-07-29 DIAGNOSIS — D72829 Elevated white blood cell count, unspecified: Secondary | ICD-10-CM | POA: Insufficient documentation

## 2023-07-29 DIAGNOSIS — C787 Secondary malignant neoplasm of liver and intrahepatic bile duct: Secondary | ICD-10-CM | POA: Diagnosis not present

## 2023-07-29 DIAGNOSIS — C801 Malignant (primary) neoplasm, unspecified: Secondary | ICD-10-CM | POA: Diagnosis not present

## 2023-07-29 LAB — CBC
HCT: 45.1 % (ref 39.0–52.0)
Hemoglobin: 15 g/dL (ref 13.0–17.0)
MCH: 31.5 pg (ref 26.0–34.0)
MCHC: 33.3 g/dL (ref 30.0–36.0)
MCV: 94.7 fL (ref 80.0–100.0)
Platelets: 158 10*3/uL (ref 150–400)
RBC: 4.76 MIL/uL (ref 4.22–5.81)
RDW: 13.3 % (ref 11.5–15.5)
WBC: 10.4 10*3/uL (ref 4.0–10.5)
nRBC: 0 % (ref 0.0–0.2)

## 2023-07-29 LAB — PROTIME-INR
INR: 1 (ref 0.8–1.2)
Prothrombin Time: 12.9 s (ref 11.4–15.2)

## 2023-07-29 MED ORDER — FENTANYL CITRATE (PF) 100 MCG/2ML IJ SOLN
INTRAMUSCULAR | Status: AC
  Filled 2023-07-29: qty 2

## 2023-07-29 MED ORDER — FENTANYL CITRATE (PF) 100 MCG/2ML IJ SOLN
INTRAMUSCULAR | Status: AC | PRN
  Administered 2023-07-29 (×2): 50 ug via INTRAVENOUS

## 2023-07-29 MED ORDER — MIDAZOLAM HCL 2 MG/2ML IJ SOLN
INTRAMUSCULAR | Status: AC | PRN
  Administered 2023-07-29 (×2): 1 mg via INTRAVENOUS

## 2023-07-29 MED ORDER — MIDAZOLAM HCL 2 MG/2ML IJ SOLN
INTRAMUSCULAR | Status: AC
  Filled 2023-07-29: qty 2

## 2023-07-29 MED ORDER — LIDOCAINE HCL 1 % IJ SOLN
INTRAMUSCULAR | Status: AC
  Filled 2023-07-29: qty 20

## 2023-07-29 MED ORDER — HYDROCODONE-ACETAMINOPHEN 5-325 MG PO TABS: 1.0000 | ORAL_TABLET | ORAL | Status: DC | PRN

## 2023-07-29 MED ORDER — SODIUM CHLORIDE 0.9 % IV SOLN: INTRAVENOUS | Status: DC

## 2023-07-29 MED ORDER — GELATIN ABSORBABLE 12-7 MM EX MISC
CUTANEOUS | Status: AC
  Filled 2023-07-29: qty 1

## 2023-07-29 NOTE — Sedation Documentation (Signed)
 RN Dinari Stgermaine pulled 2mg  Versed  and 100mcg Fentanyl  out of pysix in US  room. Pt. Received a total of 2mg  Versed  and 100mcg Fentanyl  throughout the procedure. No waste needed.

## 2023-07-29 NOTE — Discharge Instructions (Signed)
 PLEASE CALL [facility] [phone] WITH ANY QUESTIONS OR CONCERNS  YOU MAY REMOVE YOUR DRESSING AND SHOWER TOMORROW

## 2023-07-29 NOTE — Procedures (Signed)
  Procedure:  US  core biopsy R liver mass 18g Preprocedure diagnosis: Diagnoses of Liver lesion, right lobe, Protein-calorie malnutrition, severe ([name]), and Leukocytosis, unspecified type were pertinent to this visit. Postprocedure diagnosis: same EBL:    minimal Complications:   none immediate  See full dictation in [employer].  Nicky Barrack MD Main # [phone] Pager  [phone] Mobile [phone]

## 2023-08-02 ENCOUNTER — Ambulatory Visit: Payer: Self-pay | Admitting: Internal Medicine

## 2023-08-02 ENCOUNTER — Telehealth: Payer: Self-pay | Admitting: Internal Medicine

## 2023-08-02 DIAGNOSIS — C799 Secondary malignant neoplasm of unspecified site: Secondary | ICD-10-CM

## 2023-08-02 DIAGNOSIS — K50813 Crohn's disease of both small and large intestine with fistula: Secondary | ICD-10-CM

## 2023-08-02 LAB — SURGICAL PATHOLOGY

## 2023-08-02 NOTE — Telephone Encounter (Signed)
 I called the patient to inform him that the biopsy from the liver shows adenocarcinoma.  I explained that we are not certain if it began in the bile duct, or somewhere else in the gastrointestinal tract.  His C. difficile is improving.  He needs an EGD and colonoscopy.   Given that he is being treated for C. difficile we should not do this in the [facility].  I have hospital time on June 19 and would like to schedule it that day.  Right now there is a patient at the end of that schedule who needs to be taken off (I have explained this to Santina Cull, PA-C who scheduled it).  We could put [name] on for that date.  He is to see me on June 16 in the office at which point we could give him prep instructions.   So, please do the following:  1-work with Mylinda Asa or her team regarding taking the last patient [name]) off of my schedule for June 19 and schedule EGD and colonoscopy for this patient.  Diagnosis is  Encounter Diagnoses  Name Primary?   Metastatic adenocarcinoma of unknown origin ([name]) Yes   Crohn's disease of both small and large intestine with fistula ([name])     2-refer to GI oncology regarding the metastatic adenocarcinoma found in liver in patient with [facility] and Crohn's disease

## 2023-08-03 MED ORDER — NA SULFATE-K SULFATE-MG SULF 17.5-3.13-1.6 GM/177ML PO SOLN: 1.0000 | Freq: Once | ORAL | 0 refills | Status: AC

## 2023-08-03 NOTE — Telephone Encounter (Signed)
 Colonoscopy scheduled for 08/18/2023 at [facility]. Patient notified and instructions posted to My Chart per patient request. Oncology referral placed urgent. All questions answered.

## 2023-08-04 DIAGNOSIS — F330 Major depressive disorder, recurrent, mild: Secondary | ICD-10-CM | POA: Diagnosis not present

## 2023-08-04 DIAGNOSIS — F411 Generalized anxiety disorder: Secondary | ICD-10-CM | POA: Diagnosis not present

## 2023-08-10 ENCOUNTER — Telehealth: Payer: Self-pay | Admitting: Gastroenterology

## 2023-08-10 NOTE — Telephone Encounter (Signed)
 Procedure:Endoscopy/Colonoscopy Procedure date: 08/18/23 Procedure location: [facility] Arrival Time: 9:30 am Spoke with the patient Y/N: Yes Any prep concerns? No  Has the patient obtained the prep from the pharmacy ? Yes Do you have a care partner and transportation: Yes Any additional concerns? No

## 2023-08-11 ENCOUNTER — Encounter (HOSPITAL_COMMUNITY): Payer: Self-pay | Admitting: Internal Medicine

## 2023-08-11 DIAGNOSIS — F411 Generalized anxiety disorder: Secondary | ICD-10-CM | POA: Diagnosis not present

## 2023-08-11 DIAGNOSIS — F330 Major depressive disorder, recurrent, mild: Secondary | ICD-10-CM | POA: Diagnosis not present

## 2023-08-11 NOTE — Progress Notes (Signed)
 Attempted to obtain medical history for pre op call via telephone, unable to reach at this time. HIPAA compliant voicemail message left requesting return call to pre surgical testing department.

## 2023-08-15 ENCOUNTER — Encounter: Admitting: Internal Medicine

## 2023-08-15 ENCOUNTER — Other Ambulatory Visit (INDEPENDENT_AMBULATORY_CARE_PROVIDER_SITE_OTHER)

## 2023-08-15 ENCOUNTER — Ambulatory Visit (INDEPENDENT_AMBULATORY_CARE_PROVIDER_SITE_OTHER): Admitting: Internal Medicine

## 2023-08-15 ENCOUNTER — Encounter: Payer: Self-pay | Admitting: Internal Medicine

## 2023-08-15 ENCOUNTER — Ambulatory Visit: Payer: Self-pay | Admitting: Internal Medicine

## 2023-08-15 VITALS — BP 122/78 | HR 110 | Ht 73.0 in | Wt 153.5 lb

## 2023-08-15 DIAGNOSIS — K50813 Crohn's disease of both small and large intestine with fistula: Secondary | ICD-10-CM

## 2023-08-15 DIAGNOSIS — A0472 Enterocolitis due to Clostridium difficile, not specified as recurrent: Secondary | ICD-10-CM

## 2023-08-15 DIAGNOSIS — D500 Iron deficiency anemia secondary to blood loss (chronic): Secondary | ICD-10-CM | POA: Diagnosis not present

## 2023-08-15 DIAGNOSIS — C799 Secondary malignant neoplasm of unspecified site: Secondary | ICD-10-CM

## 2023-08-15 DIAGNOSIS — K8301 Primary sclerosing cholangitis: Secondary | ICD-10-CM

## 2023-08-15 DIAGNOSIS — K754 Autoimmune hepatitis: Secondary | ICD-10-CM

## 2023-08-15 DIAGNOSIS — K7460 Unspecified cirrhosis of liver: Secondary | ICD-10-CM | POA: Diagnosis not present

## 2023-08-15 LAB — COMPREHENSIVE METABOLIC PANEL WITH GFR
ALT: 80 U/L — ABNORMAL HIGH (ref 0–53)
AST: 43 U/L — ABNORMAL HIGH (ref 0–37)
Albumin: 3.6 g/dL (ref 3.5–5.2)
Alkaline Phosphatase: 158 U/L — ABNORMAL HIGH (ref 39–117)
BUN: 24 mg/dL — ABNORMAL HIGH (ref 6–23)
CO2: 29 meq/L (ref 19–32)
Calcium: 9 mg/dL (ref 8.4–10.5)
Chloride: 103 meq/L (ref 96–112)
Creatinine, Ser: 1.16 mg/dL (ref 0.40–1.50)
GFR: 68.58 mL/min (ref >60.00)
Glucose, Bld: 145 mg/dL — ABNORMAL HIGH (ref 70–99)
Potassium: 4 meq/L (ref 3.5–5.1)
Sodium: 140 meq/L (ref 135–145)
Total Bilirubin: 0.4 mg/dL (ref 0.2–1.2)
Total Protein: 5.6 g/dL — ABNORMAL LOW (ref 6.0–8.3)

## 2023-08-15 LAB — CBC WITH DIFFERENTIAL/PLATELET
Basophils Absolute: 0 10*3/uL (ref 0.0–0.1)
Basophils Relative: 0.2 % (ref 0.0–3.0)
Eosinophils Absolute: 0 10*3/uL (ref 0.0–0.7)
Eosinophils Relative: 0.1 % (ref 0.0–5.0)
HCT: 45.8 % (ref 39.0–52.0)
Hemoglobin: 15.6 g/dL (ref 13.0–17.0)
Lymphocytes Relative: 2.1 % — ABNORMAL LOW (ref 12.0–46.0)
Lymphs Abs: 0.3 10*3/uL — ABNORMAL LOW (ref 0.7–4.0)
MCHC: 34.1 g/dL (ref 30.0–36.0)
MCV: 90.8 fl (ref 78.0–100.0)
Monocytes Absolute: 0.4 10*3/uL (ref 0.1–1.0)
Monocytes Relative: 3.3 % (ref 3.0–12.0)
Neutro Abs: 12 10*3/uL — ABNORMAL HIGH (ref 1.4–7.7)
Neutrophils Relative %: 94.3 % — ABNORMAL HIGH (ref 43.0–77.0)
Platelets: 197 10*3/uL (ref 150.0–400.0)
RBC: 5.04 Mil/uL (ref 4.22–5.81)
RDW: 14.9 % (ref 11.5–15.5)
WBC: 12.7 10*3/uL — ABNORMAL HIGH (ref 4.0–10.5)

## 2023-08-15 NOTE — Patient Instructions (Addendum)
 Your provider has requested that you go to the basement level for lab work before leaving today. Press B on the elevator. The lab is located at the first door on the left as you exit the elevator.  You have been scheduled for a colonoscopy. Please follow written instructions given to you at your visit today.   If you use inhalers (even only as needed), please bring them with you on the day of your procedure.  I appreciate the opportunity to care for you. Loy Ruff, MD, [facility]

## 2023-08-15 NOTE — Progress Notes (Signed)
 [name] 58 y.o. [birth year] [id number]  Assessment & Plan:   Encounter Diagnoses  Name Primary?   Metastatic adenocarcinoma of unknown origin ([name]) Yes   Crohn's disease of both small and large intestine with fistula ([name])    Cirrhosis of liver without ascites, unspecified hepatic cirrhosis type ([name])    Autoimmune hepatitis ([name])    Primary sclerosing cholangitis    C. difficile colitis    Working diagnosis is metastatic adenocarcinoma of unknown origin but given his overall history highly suspect a biliary tumor like a cholangiocarcinoma.  He is certainly at higher risk of colon cancer given his Crohn's disease and pseudopolyposis problems, gastric cancer in the differential as well.  Proceed with colonoscopy on June 19 as planned as well as EGD.  Keep oncology consultation.  Continue current therapy.  His C. difficile has responded to vancomycin  and he has a taper ordered.   Orders Placed This Encounter  Procedures   Comp Met (CMET)   Will see if we can add a CEA level.  If not we will do at the hospital when he has his procedures.   Subjective:  Gastroenterology summary:   Crohn's disease of small and large intestine with history of fistula and perianal abscess   COLONOSCOPY/PATH DX 2001, REPEAT 2005 PATH NEG 2012: Active Crohn's right colon - ileum ok Intolerant of immunomodulators, CellCept, also intolerant of AZA though questionable. 2014 Crohn's flare and ischemic colitis Summer 2014 off prednisone  and back on Humira  2015 colonoscopy ileocolonic inflammation no I think improved 08/30/2016 - Entyvio  to start November 2021 vedolizumab  trough 9.4 mcg/dL going to monthly vedolizumab  2022 every 6-week vedolizumab  March 2023 colonoscopy with pseudopolyps/inflammatory polyps but mucosal biopsies were without active inflammatory bowel disease   Status post colorectal surgery care with seton placement in the past for perirectal fistula and abscess    Posterior anal fissure on and off also   Iron  deficiency anemia chronic and recurrent has been treated with parenteral iron  at [facility] where he gets his Entyvio  infusions   Autoimmune hepatitis-PSC overlap with cirrhosis (also history of alcohol abuse)   AbnormalLFT's seen 8/ 2001 Marked elevation of transaminases Feb 2007, alkaline phosphatase 157, coags normal Positive anti-smooth muscle antibody at 1-80 February 2007, F. Acton antibody IgG 57 which is elevated, other serologies and celiac profile negative Liver biopsy May 11, 2005: Mild chronic hepatitis inflammation grade 1, no fibrosis identified, 2+ cirrhosis, prednisone  begun with apparent response, reduction in transaminases. The patient has always had minimal symptomatology Immune to HAV and HBV Treated with prednisone  and azathioprine though azathioprine stopped by patient, trial of Cellcept stopped by patient Liver biopsy #2 August 31, 2006 chronic mildly active hepatitis with focal portal fibrosis, iron  staining negative, SPEP normal, ANA negative anti-smooth muscle antibody still positive 01/11/07 MR/MRCP right hepatic lobe ducts irregular and beaded consistent with PSC, minimal perihepatic ascites, o/w negative [facility] evaluation, Dr. Orest Bio April and May 2009, MRCP suggests [facility] and raises ? of cholangiocarcinoma vs fibrosis right lobe of liver, CA 19-9 87 (NL <40), follow-up at [facility] or here suggested with repeat liver biospy to be considered. MR earlier 2010 without signs of cholabgiocarcinoma. Transaminase elevations have been fairly asymptomatic and reduction in levels has not always correlated with prednisone  use. MR MRCP 01/08/2011 shows progressive changes of inflammation in the right hepatic lobe and ducts without mass lesion 12/2011 MRCP 1. Slowly progressive hepatic fibrosis secondary to underlying  sclerosing cholangitis. No mass lesion is identified to suggest  cholangiocarcinoma.  2. Stable  intrahepatic biliary dilatation and beading of the  common bile duct. Possible tiny gallstones. No evidence of  choledocholithiasis. 12/2012 liver bx CHRONIC HEPATITIS WITH MODERATE ACTIVITY AND BRIDGING FIBROSIS restart prednisone  40 mg daily   July 2021 A. LIVER, LEFT, RANDOM, BIOPSY:  - Features of mild large bile duct obstruction, compatible with history  of primary sclerosing cholangitis. See comment  - Minimal portal fibrosis, Stage 0-1 of 4.    May 2025 Liver mass-3.7 x 2.8 x 4.3 cm lateral aspect of hepatic dome.  Biopsy show adenocarcinoma with signet ring cell features favoring a metastasis though could be of biliary origin, gastrointestinal or pancreatic possible also.  Also has an enlarging liver cyst and a stable liver cyst. CA 19-9 897     Portal vein thrombosis treated and resolved with anticoagulation though has left atrophic liver segments as above     History of mesenteric ischemia problems SMA stenosis and briefly went to hospice at this time as he decided to stop all treatment but recovered    Chronic steroid dependence Has not been able to wean steroids ever, is aware of the potential risks.  In general he can only take 20 mg prednisone  tablets.  Dose ranges from 20 to 30 mg daily.   Chronic opioid use with chronic pain syndrome Years of hydrocodone  5-325 mg 1 each day.  August 2024 stopped due to components of the pill (titanium and silica dioxide) but the patient perceives cause gastrointestinal disturbance.  Tried a formulation of acetaminophen  from compound pharmacy but did not tolerate.  March 2025-will not represcribe hydrocodone .   Chronic recurrent depression with a history of avoidant restrictive food intake disorder and anxiety and previous suicide attempt  C. difficile colitis times 01 Jul 2023 -------------------------------------------------------------------------------------------------------------------   Chief Complaint: Follow-up for  adenocarcinoma found in liver, Crohn's disease C. difficile colitis anticipating EGD and colonoscopy  HPI 58 year old man with complicated medical history, mostly outlined as above in the summary, who was found to have adenocarcinoma with signet ring features and a liver lesion in the lateral aspect of the hepatic dome last month, while hospitalized with a recurrent C. difficile.  He is on treatment with vancomycin  for C. difficile and stools are now normal.  He continues to take high-dose prednisone  at 30 mg daily which has been a chronic treatment for him and has been treated with Entyvio  as well.  He has an upcoming oncology appointment on June 27 and he is scheduled for EGD and colonoscopy on June 19 at [facility].  He is not complaining of any pain fevers or chills.  Says he is drinking a lot of water and trying to stay hydrated.  We have previously discussed his diagnosis over the phone. Wt Readings from Last 3 Encounters:  08/15/23 153 lb 8 oz (69.6 kg)  07/29/23 155 lb (70.3 kg)  07/05/23 155 lb 10.3 oz (70.6 kg)   Lab Results  Component Value Date   INR 1.0 07/29/2023   INR 1.0 07/04/2023   INR 1.3 (H) 11/11/2022    Lab Results  Component Value Date   WBC 10.4 07/29/2023   HGB 15.0 07/29/2023   HCT 45.1 07/29/2023   MCV 94.7 07/29/2023   PLT 158 07/29/2023   Lab Results  Component Value Date   ALT 88 (H) 07/26/2023   AST 39 07/26/2023   ALKPHOS 117 07/26/2023   BILITOT 1.0 07/26/2023   CA 19-9 07/03/2018 5897 Alpha-fetoprotein less than 1.8  Allergies  Allergen Reactions   Tape Itching, Rash and Other (See Comments)    Please use paper tape   Asacol  [Mesalamine ] Diarrhea and Other (See Comments)    Abd pain, too   Azathioprine Diarrhea and Other (See Comments)    Abd pain, too   Gluten Meal Diarrhea   Metoprolol  Tartrate Nausea Only and Other (See Comments)    Stomach upset   Mycophenolate Mofetil Diarrhea and Other (See Comments)    Abd pain, too    Other Diarrhea and Other (See Comments)    NUTS   Wheat Diarrhea and Other (See Comments)    Constipation; flatulence; abd pain (can tolerate sometimes)   Abilify  [Aripiprazole ] Diarrhea   Amoxicillin  Other (See Comments)    Lots of gas   Current Meds  Medication Sig   ALAWAY 0.035 % ophthalmic solution Place 1 drop into both eyes 2 (two) times daily as needed (for irritation).   hydrOXYzine  (ATARAX ) 25 MG tablet Take 1 tablet (25 mg total) by mouth 3 (three) times daily as needed for anxiety.   mirtazapine  (REMERON ) 30 MG tablet Take 0.5 tablets (15 mg total) by mouth at bedtime.   Nutritional Supplements (ENSURE HIGH PROTEIN) LIQD Take 237 mLs by mouth See admin instructions. Drink 237 ml's of CHOCOLATE HP Ensure by mouth 2 times a day   predniSONE  (DELTASONE ) 20 MG tablet TAKE 1.5 TABLETS (30 MG TOTAL) BY MOUTH DAILY WITH BREAKFAST.   vancomycin  (VANCOCIN ) 125 MG capsule On day 15 take 1 capsule TID x 7 days then BID x 7 days then daily x 7 days   Vedolizumab  (ENTYVIO  IV) Inject into the vein every 6 (six) weeks.   venlafaxine  XR (EFFEXOR -XR) 150 MG 24 hr capsule Take 1 capsule (150 mg total) by mouth daily with breakfast.   venlafaxine  XR (EFFEXOR -XR) 75 MG 24 hr capsule Take 75 mg by mouth See admin instructions. 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 06/15/2018   Cancer of skin of neck    Cataract    Chronic mesenteric ischemia ([name])    Chronic pain syndrome 07/09/2016   Crohn's disease of small and large intestines ([name])    followed by dr Gorman Laughter Tajha Sammarco   Dairy product intolerance    Diarrhea, functional    Family history  of adverse reaction to anesthesia    Grover's disease    transient acantholytic dermatosis   History of alcohol abuse    History of basal cell carcinoma excision    2013 left leg   History of Clostridium difficile    10/ 2014   History of multiple concussions    x6   last one Jan 2017 per pt--  no residual   History of substance abuse ([name])    quit 1997 per pt   History of suicide attempt    05-18-2012  overdose /  failure to thrive   Iron  deficiency anemia due to chronic blood loss    Major depression, recurrent, chronic ([name])    Osteopenia    Personal history of adenomatous colonic polyps 12/2010, 03/2012  12/2010 - 8 mm serrated adenoma of rectum   Portal vein thrombosis 03/21/2015   right   Primary sclerosing cholangitis    ? 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   Substance abuse ([name]) 1997   Alcohol   Vitamin A  deficiency 05/23/2018   Vitamin B6 deficiency 07/27/2018   Vitamin C deficiency 05/23/2018   Past Surgical History:  Procedure Laterality Date   ABDOMINAL AORTAGRAM N/A 03/29/2012   Procedure: ABDOMINAL Tommi Fraise;  Surgeon: Margherita Shell, MD;  Location: [facility] CATH LAB;  Service: Cardiovascular;  Laterality: N/A;   ADENOIDECTOMY  age 7   BIOPSY  05/31/2018   Procedure: BIOPSY;  Surgeon: Janel Medford, MD;  Location: [facility] ENDOSCOPY;  Service: Endoscopy;;   CATARACT EXTRACTION W/ INTRAOCULAR LENS  IMPLANT, BILATERAL  2009   COLONOSCOPY  2001, 05/02/2003, 01/28/11   2012: Right colon Crohn's, rectal polyp   COLONOSCOPY  03/31/2012   Procedure: COLONOSCOPY;  Surgeon: Nannette Babe, MD;  Location[facility] ENDOSCOPY;  Service: Gastroenterology;  Laterality: N/A;   COLONOSCOPY WITH PROPOFOL  N/A 05/31/2018   Procedure: COLONOSCOPY WITH PROPOFOL ;  Surgeon: Janel Medford, MD;  Location: [facility] ENDOSCOPY;  Service: Endoscopy;  Laterality: N/A;   ESOPHAGOGASTRODUODENOSCOPY  01/28/2011   Normal   FOOT SURGERY  Right age 75   MOHS SURGERY Left 11/2013   left ankle parakerotosis    PERCUTANEOUS LIVER BIOPSY  2007 and 2008   PILONIDAL CYST EXCISION  age 9   PLACEMENT OF SETON N/A 11/06/2015   Procedure: PLACEMENT OF SETON;  Surgeon: Joyce Nixon, MD;  Location: [facility];  Service: General;  Laterality: N/A;   PLACEMENT OF SETON  07/2018   at [facility]   RECTAL EXAM UNDER ANESTHESIA N/A 01/18/2019   Procedure: ANAL EXAM UNDER ANESTHESIA,  INCISION AND DRAINAGE, SETON PLACEMENT;  Surgeon: Joyce Nixon, MD;  Location[facility];  Service: General;  Laterality: N/A;   UPPER GASTROINTESTINAL ENDOSCOPY     Social History   Social History Narrative   Single, history of substance abuse in recovery   Previously occupied on medical disability   1 child  Daughter Wellsite geologist, lives and works in [place]   Elderly parents involved and he helps them   1 brother   family history includes Allergies in his father; Breast cancer in his mother; Clotting disorder in his father; Heart disease in his father; Stomach cancer in his mother; Thyroid  cancer in his father.   Review of Systems See HPI.  Depression is  Objective:   Physical Exam @BP  122/78 (BP Location: Left Arm, Patient Position: Sitting, Cuff Size: Normal)   Pulse (!) 110   Ht 6' 1 (1.854 m)   Wt 153 lb 8 oz (69.6 kg)   BMI 20.25 kg/m @  General:  NAD Eyes:   anicteric Lungs:  clear Heart::  S1S2 no rubs, murmurs or gallops Abdomen:  soft and nontender, BS+ Ext:   no edema, cyanosis or clubbing    Data Reviewed:  See summary and HPI

## 2023-08-18 ENCOUNTER — Other Ambulatory Visit: Payer: Self-pay

## 2023-08-18 ENCOUNTER — Ambulatory Visit (HOSPITAL_COMMUNITY)
Admission: RE | Admit: 2023-08-18 | Discharge: 2023-08-18 | Disposition: A | Discharge status: Home / Self Care | Source: Ambulatory Visit | Attending: Internal Medicine | Admitting: Internal Medicine

## 2023-08-18 ENCOUNTER — Encounter (HOSPITAL_COMMUNITY)
Admission: RE | Disposition: A | Discharge status: Home / Self Care | Payer: Self-pay | Source: Ambulatory Visit | Attending: Internal Medicine

## 2023-08-18 ENCOUNTER — Ambulatory Visit (HOSPITAL_COMMUNITY): Admitting: Anesthesiology

## 2023-08-18 ENCOUNTER — Encounter (HOSPITAL_COMMUNITY): Payer: Self-pay | Admitting: Internal Medicine

## 2023-08-18 DIAGNOSIS — D509 Iron deficiency anemia, unspecified: Secondary | ICD-10-CM | POA: Diagnosis not present

## 2023-08-18 DIAGNOSIS — E1151 Type 2 diabetes mellitus with diabetic peripheral angiopathy without gangrene: Secondary | ICD-10-CM

## 2023-08-18 DIAGNOSIS — D124 Benign neoplasm of descending colon: Secondary | ICD-10-CM | POA: Insufficient documentation

## 2023-08-18 DIAGNOSIS — K5730 Diverticulosis of large intestine without perforation or abscess without bleeding: Secondary | ICD-10-CM | POA: Diagnosis not present

## 2023-08-18 DIAGNOSIS — K920 Hematemesis: Secondary | ICD-10-CM | POA: Diagnosis not present

## 2023-08-18 DIAGNOSIS — D123 Benign neoplasm of transverse colon: Secondary | ICD-10-CM

## 2023-08-18 DIAGNOSIS — D125 Benign neoplasm of sigmoid colon: Secondary | ICD-10-CM

## 2023-08-18 DIAGNOSIS — Z9151 Personal history of suicidal behavior: Secondary | ICD-10-CM | POA: Diagnosis not present

## 2023-08-18 DIAGNOSIS — F419 Anxiety disorder, unspecified: Secondary | ICD-10-CM | POA: Diagnosis not present

## 2023-08-18 DIAGNOSIS — Z85828 Personal history of other malignant neoplasm of skin: Secondary | ICD-10-CM | POA: Diagnosis not present

## 2023-08-18 DIAGNOSIS — K5080 Crohn's disease of both small and large intestine without complications: Secondary | ICD-10-CM | POA: Diagnosis not present

## 2023-08-18 DIAGNOSIS — E785 Hyperlipidemia, unspecified: Secondary | ICD-10-CM | POA: Diagnosis not present

## 2023-08-18 DIAGNOSIS — E876 Hypokalemia: Secondary | ICD-10-CM | POA: Diagnosis not present

## 2023-08-18 DIAGNOSIS — K317 Polyp of stomach and duodenum: Secondary | ICD-10-CM | POA: Diagnosis not present

## 2023-08-18 DIAGNOSIS — K648 Other hemorrhoids: Secondary | ICD-10-CM | POA: Insufficient documentation

## 2023-08-18 DIAGNOSIS — C799 Secondary malignant neoplasm of unspecified site: Secondary | ICD-10-CM | POA: Diagnosis not present

## 2023-08-18 DIAGNOSIS — Z860101 Personal history of adenomatous and serrated colon polyps: Secondary | ICD-10-CM | POA: Diagnosis not present

## 2023-08-18 DIAGNOSIS — K621 Rectal polyp: Secondary | ICD-10-CM | POA: Diagnosis not present

## 2023-08-18 DIAGNOSIS — I10 Essential (primary) hypertension: Secondary | ICD-10-CM

## 2023-08-18 DIAGNOSIS — K769 Liver disease, unspecified: Secondary | ICD-10-CM | POA: Diagnosis not present

## 2023-08-18 DIAGNOSIS — Z87891 Personal history of nicotine dependence: Secondary | ICD-10-CM | POA: Insufficient documentation

## 2023-08-18 DIAGNOSIS — C227 Other specified carcinomas of liver: Secondary | ICD-10-CM | POA: Insufficient documentation

## 2023-08-18 DIAGNOSIS — E119 Type 2 diabetes mellitus without complications: Secondary | ICD-10-CM | POA: Insufficient documentation

## 2023-08-18 DIAGNOSIS — Z79899 Other long term (current) drug therapy: Secondary | ICD-10-CM | POA: Diagnosis not present

## 2023-08-18 DIAGNOSIS — Z79620 Long term (current) use of immunosuppressive biologic: Secondary | ICD-10-CM | POA: Diagnosis not present

## 2023-08-18 DIAGNOSIS — C229 Malignant neoplasm of liver, not specified as primary or secondary: Secondary | ICD-10-CM | POA: Diagnosis not present

## 2023-08-18 DIAGNOSIS — K6289 Other specified diseases of anus and rectum: Secondary | ICD-10-CM | POA: Diagnosis not present

## 2023-08-18 DIAGNOSIS — K2970 Gastritis, unspecified, without bleeding: Secondary | ICD-10-CM | POA: Insufficient documentation

## 2023-08-18 DIAGNOSIS — K529 Noninfective gastroenteritis and colitis, unspecified: Secondary | ICD-10-CM

## 2023-08-18 DIAGNOSIS — Y838 Other surgical procedures as the cause of abnormal reaction of the patient, or of later complication, without mention of misadventure at the time of the procedure: Secondary | ICD-10-CM | POA: Diagnosis present

## 2023-08-18 DIAGNOSIS — K5140 Inflammatory polyps of colon without complications: Secondary | ICD-10-CM

## 2023-08-18 DIAGNOSIS — C221 Intrahepatic bile duct carcinoma: Secondary | ICD-10-CM | POA: Insufficient documentation

## 2023-08-18 DIAGNOSIS — T184XXA Foreign body in colon, initial encounter: Secondary | ICD-10-CM | POA: Diagnosis not present

## 2023-08-18 DIAGNOSIS — D131 Benign neoplasm of stomach: Secondary | ICD-10-CM

## 2023-08-18 DIAGNOSIS — K91840 Postprocedural hemorrhage and hematoma of a digestive system organ or structure following a digestive system procedure: Secondary | ICD-10-CM | POA: Diagnosis not present

## 2023-08-18 DIAGNOSIS — A0472 Enterocolitis due to Clostridium difficile, not specified as recurrent: Secondary | ICD-10-CM | POA: Diagnosis not present

## 2023-08-18 DIAGNOSIS — F4310 Post-traumatic stress disorder, unspecified: Secondary | ICD-10-CM | POA: Diagnosis not present

## 2023-08-18 DIAGNOSIS — G894 Chronic pain syndrome: Secondary | ICD-10-CM | POA: Diagnosis not present

## 2023-08-18 DIAGNOSIS — R197 Diarrhea, unspecified: Secondary | ICD-10-CM | POA: Diagnosis not present

## 2023-08-18 DIAGNOSIS — C787 Secondary malignant neoplasm of liver and intrahepatic bile duct: Secondary | ICD-10-CM

## 2023-08-18 DIAGNOSIS — K6389 Other specified diseases of intestine: Secondary | ICD-10-CM | POA: Diagnosis not present

## 2023-08-18 DIAGNOSIS — K921 Melena: Secondary | ICD-10-CM | POA: Diagnosis not present

## 2023-08-18 DIAGNOSIS — K635 Polyp of colon: Secondary | ICD-10-CM

## 2023-08-18 DIAGNOSIS — R160 Hepatomegaly, not elsewhere classified: Secondary | ICD-10-CM | POA: Diagnosis not present

## 2023-08-18 DIAGNOSIS — W449XXA Unspecified foreign body entering into or through a natural orifice, initial encounter: Secondary | ICD-10-CM | POA: Diagnosis present

## 2023-08-18 DIAGNOSIS — I739 Peripheral vascular disease, unspecified: Secondary | ICD-10-CM | POA: Diagnosis not present

## 2023-08-18 DIAGNOSIS — F32A Depression, unspecified: Secondary | ICD-10-CM | POA: Diagnosis not present

## 2023-08-18 DIAGNOSIS — K7460 Unspecified cirrhosis of liver: Secondary | ICD-10-CM | POA: Diagnosis not present

## 2023-08-18 DIAGNOSIS — K754 Autoimmune hepatitis: Secondary | ICD-10-CM | POA: Diagnosis not present

## 2023-08-18 DIAGNOSIS — Z7952 Long term (current) use of systemic steroids: Secondary | ICD-10-CM | POA: Diagnosis not present

## 2023-08-18 DIAGNOSIS — K5090 Crohn's disease, unspecified, without complications: Secondary | ICD-10-CM | POA: Diagnosis not present

## 2023-08-18 DIAGNOSIS — K5790 Diverticulosis of intestine, part unspecified, without perforation or abscess without bleeding: Secondary | ICD-10-CM | POA: Diagnosis not present

## 2023-08-18 DIAGNOSIS — K922 Gastrointestinal hemorrhage, unspecified: Secondary | ICD-10-CM | POA: Diagnosis not present

## 2023-08-18 HISTORY — PX: COLONOSCOPY: SHX5424

## 2023-08-18 HISTORY — PX: ESOPHAGOGASTRODUODENOSCOPY: SHX5428

## 2023-08-18 SURGERY — EGD (ESOPHAGOGASTRODUODENOSCOPY)
Anesthesia: Monitor Anesthesia Care

## 2023-08-18 MED ORDER — PROPOFOL 500 MG/50ML IV EMUL
INTRAVENOUS | Status: DC | PRN
  Administered 2023-08-18: 135 ug/kg/min via INTRAVENOUS

## 2023-08-18 MED ORDER — LIDOCAINE 2% (20 MG/ML) 5 ML SYRINGE
INTRAMUSCULAR | Status: DC | PRN
  Administered 2023-08-18: 100 mg via INTRAVENOUS

## 2023-08-18 MED ORDER — SODIUM CHLORIDE 0.9 % IV SOLN: INTRAVENOUS | Status: DC

## 2023-08-18 MED ORDER — PROPOFOL 10 MG/ML IV BOLUS
INTRAVENOUS | Status: DC | PRN
  Administered 2023-08-18: 50 mg via INTRAVENOUS

## 2023-08-18 NOTE — Op Note (Signed)
 [facility] Patient Name: [name] Procedure Date: 08/18/2023 MRN: [id number] Attending MD: Kenney Peacemaker , MD, [id number] Date of Birth: 1965/05/20 CSN: [id number] Age: 58 Admit Type: Outpatient Procedure:                Upper GI endoscopy Indications:              Tumor of the GI tract adenocarcinoma in the liver,                            currently of unclear primary though                            cholangiocarcinoma seems likely. Evaluating for                            potential upper GI tract malignancy. Providers:                Kenney Peacemaker, MD, Taunya Fass, RN,                            Arlin Benes, Technician, Trevor Fudge [place], CRNA Referring MD:              Medicines:                Monitored Anesthesia Care Complications:            No immediate complications. Estimated Blood Loss:     Estimated blood loss was minimal. Procedure:                Pre-Anesthesia Assessment:                           - Prior to the procedure, a History and Physical                            was performed, and patient medications and                            allergies were reviewed. The patient's tolerance of                            previous anesthesia was also reviewed. The risks                            and benefits of the procedure and the sedation                            options and risks were discussed with the patient.                            All questions were answered, and informed consent                            was obtained. Prior Anticoagulants: The patient has  taken no anticoagulant or antiplatelet agents. ASA                            Grade Assessment: IV - A patient with severe                            systemic disease that is a constant threat to life.                            After reviewing the risks and benefits, the patient                            was deemed in satisfactory  condition to undergo the                            procedure.                           After obtaining informed consent, the endoscope was                            passed under direct vision. Throughout the                            procedure, the patient's blood pressure, pulse, and                            oxygen saturations were monitored continuously. The                            [id number]-H190 ([id number]) Olympus endoscope was introduced                            through the mouth, and advanced to the second part                            of duodenum. The upper GI endoscopy was                            accomplished without difficulty. The patient                            tolerated the procedure well. Scope In: Scope Out: Findings:      Diffuse mild inflammation characterized by congestion (edema), erythema       and friability was found in the gastric antrum. Biopsies were taken with       a cold forceps for histology. Verification of patient identification for       the specimen was done. Estimated blood loss was minimal.      A single 5 mm semi-sessile polyp with no bleeding and no stigmata of       recent bleeding was found in the gastric body. Biopsies were taken with       a cold forceps for histology. Verification of patient identification for  the specimen was done. Estimated blood loss was minimal.      The exam was otherwise without abnormality.      The cardia and gastric fundus were otherwise normal on retroflexion. Impression:               - Gastritis. Biopsied.                           - A single gastric polyp. Biopsied. It was                            completely removed with 2 bites of the forceps.                            This looks innocent and benign.                           - The examination was otherwise normal. Moderate Sedation:      Not Applicable - Patient had care per Anesthesia. Recommendation:           - Patient has a contact number  available for                            emergencies. The signs and symptoms of potential                            delayed complications were discussed with the                            patient. Return to normal activities tomorrow.                            Written discharge instructions were provided to the                            patient.                           - Resume previous diet.                           - Continue present medications.                           - See colonoscopy report from today also. Procedure Code(s):        --- Professional ---                           [phone], Esophagogastroduodenoscopy, flexible,                            transoral; with biopsy, single or multiple Diagnosis Code(s):        --- Professional ---                           K29.70, Gastritis, unspecified, without bleeding  K31.7, Polyp of stomach and duodenum                           D49.0, Neoplasm of unspecified behavior of                            digestive system CPT copyright 2022 [employer]. All rights reserved. The codes documented in this report are preliminary and upon coder review may  be revised to meet current compliance requirements. Kenney Peacemaker, MD 08/18/2023 2:19:38 PM This report has been signed electronically. Number of Addenda: 0

## 2023-08-18 NOTE — Op Note (Addendum)
 [facility] Patient Name: [name] Procedure Date: 08/18/2023 MRN: [id number] Attending MD: Kenney Peacemaker , MD, [id number] Date of Birth: Jul 01, 1965 CSN: [id number] Age: 58 Admit Type: Outpatient Procedure:                Colonoscopy Indications:              Suspected metastatic malignancy to                            liver?technically adenocarcinoma of unknown                            primary. He has adenocarcinoma in the liver which                            could be a cholangiocarcinoma. However pathology                            was not able to narrow it down so looking for                            possible colon cancer. He also has a long history                            of Crohn's disease of the small bowel and colon.                            History of pseudopolyps. Providers:                Kenney Peacemaker, MD, Taunya Fass, RN,                            Arlin Benes, Technician, Trevor Fudge [place], CRNA Referring MD:              Medicines:                Monitored Anesthesia Care Complications:            No immediate complications. Estimated Blood Loss:     Estimated blood loss was minimal. Procedure:                Pre-Anesthesia Assessment:                           - Prior to the procedure, a History and Physical                            was performed, and patient medications and                            allergies were reviewed. The patient's tolerance of                            previous anesthesia was also reviewed. The risks  and benefits of the procedure and the sedation                            options and risks were discussed with the patient.                            All questions were answered, and informed consent                            was obtained. Prior Anticoagulants: The patient has                            taken no anticoagulant or antiplatelet agents. ASA                             Grade Assessment: IV - A patient with severe                            systemic disease that is a constant threat to life.                            After reviewing the risks and benefits, the patient                            was deemed in satisfactory condition to undergo the                            procedure.                           After obtaining informed consent, the colonoscope                            was passed under direct vision. Throughout the                            procedure, the patient's blood pressure, pulse, and                            oxygen saturations were monitored continuously. The                            CF-HQ190L ([id number]) Olympus colonoscope was                            introduced through the anus and advanced to the the                            terminal ileum, with identification of the                            appendiceal orifice and IC valve. The colonoscopy  was performed without difficulty. The patient                            tolerated the procedure well. The quality of the                            bowel preparation was adequate. The terminal ileum,                            ileocecal valve, appendiceal orifice, and rectum                            were photographed. Scope In: 1:06:25 PM Scope Out: 2:03:00 PM Scope Withdrawal Time: 0 hours 52 minutes 41 seconds  Total Procedure Duration: 0 hours 56 minutes 35 seconds  Findings:      The perianal and digital rectal examinations were normal except for       perianal erythema      A 20 mm polyp was found in the proximal transverse colon. The polyp was       semi-pedunculated. The polyp was removed with a hot snare. Resection and       retrieval were complete. Verification of patient identification for the       specimen was done. To prevent bleeding after the polypectomy, one       hemostatic clip was successfully placed (MR  conditional). Clip       manufacturer: [employer]. Purastat applied also. There was no       bleeding at the end of the procedure. There was an adjacent diminutive       polyp so close to the base of this polyp that I removed it with cold       snare and included in the specimen jar with this.      14 pedunculated, semi-pedunculated and sessile polyps were found in the       sigmoid colon, descending colon and transverse colon. The polyps were 8       to 15 mm in size. These polyps were removed with a hot snare or cold       snare. Resection and retrieval were complete. Verification of patient       identification for the specimen was done. Estimated blood loss was       minimal.      Multiple diverticula were found in the sigmoid colon.      Scattered pseudopolyps were found in the sigmoid colon, in the       descending colon and in the transverse colon.      Internal hemorrhoids were found.      The terminal ileum appeared normal.      The exam was otherwise without abnormality on direct and retroflexion       views.      Biopsies were taken with a cold forceps in the rectum, in the sigmoid       colon, in the descending colon, in the transverse colon, in the       ascending colon and in the cecum for histology. Impression:               This was in the region of a previously placed  tattoo where a pseudopolyp had been removed in the                            past.                           Also some other diminutive inflammatory polyps                            scattered about.                           - One 20 mm polyp in the proximal transverse colon,                            removed with a hot snare. Resected and retrieved.                            Clip (MR conditional) was placed. Clip                            manufacturer: [employer].                           - 14 8 to 15 mm polyps in the sigmoid colon, in the                             descending colon and in the transverse colon,                            removed with a hot snare. Resected and retrieved.                           - Diverticulosis in the sigmoid colon.                           - Pseudopolyps in the sigmoid colon, in the                            descending colon and in the transverse colon.                           - Internal hemorrhoids.                           - The examined portion of the ileum was normal.                           - The examination was otherwise normal on direct                            and retroflexion views.                           - Biopsies were taken with  a cold forceps for                            histology in the rectum, in the sigmoid colon, in                            the descending colon, in the transverse colon, in                            the ascending colon and in the cecum. There was no                            evidence of active Crohn's disease in the mucosa                            other than pseudopolyps and some inflammatory type                            polyps seen scattered about. These biopsies were                            taken to reassess for microscopic active Crohn's                            and/or dysplasia.                           None of these polyps look overtly malignant either                            those removed or those remaining. Await pathology. Moderate Sedation:      Not Applicable - Patient had care per Anesthesia. Recommendation:           - Patient has a contact number available for                            emergencies. The signs and symptoms of potential                            delayed complications were discussed with the                            patient. Return to normal activities tomorrow.                            Written discharge instructions were provided to the                            patient.                           - Resume  previous diet.                           -  Continue present medications.                           - Await pathology results.                           - Will determine timing of repeat colonoscopy                            pending pathology review. Likely would recall in 1                            year depending clinical course.                           He has oncology appointment next week.                           CEA level was checked today.                           - No aspirin, ibuprofen, naproxen, or other                            non-steroidal anti-inflammatory drugs for 2 weeks                            after polyp removal. Procedure Code(s):        --- Professional ---                           [phone], Colonoscopy, flexible; with removal of                            tumor(s), polyp(s), or other lesion(s) by snare                            technique Diagnosis Code(s):        --- Professional ---                           D12.3, Benign neoplasm of transverse colon (hepatic                            flexure or splenic flexure)                           D12.5, Benign neoplasm of sigmoid colon                           D12.4, Benign neoplasm of descending colon                           K64.8, Other hemorrhoids                           K51.40, Inflammatory polyps of colon without  complications                           K57.30, Diverticulosis of large intestine without                            perforation or abscess without bleeding CPT copyright 2022 American Medical Association. All rights reserved. The codes documented in this report are preliminary and upon coder review may  be revised to meet current compliance requirements. Kenney Peacemaker, MD 08/18/2023 2:38:31 PM This report has been signed electronically. Number of Addenda: 0

## 2023-08-18 NOTE — H&P (Signed)
 [facility] History and Physical   Primary Care Physician:  Jimmey Mould, MD   Reason for Procedure:   Adenocarcinoma in liver - ? Metastatic - has Crohn's colitis also  Plan:    EGD and colonoscopy     HPI: [name] is a 58 y.o. male with a history of PSC/autoimmune hepatitis overlap and Crohn's colitis on chronic prednisone , Entyvio  therapy who was recently found to have a dome of the liver lesion with adenocarcinoma.  It is possible it is a cholangiocarcinoma but the pathologist was not able to determine with certainty and thought it could be metastatic.  The patient is here for EGD and colonoscopy to evaluate for potential malignancy in the upper and lower GI tracts.  He has been treated for C. difficile colitis and is still on a tapering dose of vancomycin  and is asymptomatic with respect to that.   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 06/15/2018   Cancer of skin of neck    Cataract    Chronic mesenteric ischemia ([name])    Chronic pain syndrome 07/09/2016   Crohn's disease of small and large intestines ([name])    followed by dr Gorman Laughter Cambreigh Dearing   Dairy product intolerance    Diarrhea, functional    Family history of adverse reaction to anesthesia    Grover's disease    transient acantholytic dermatosis   History of alcohol abuse    History of basal cell carcinoma excision    2013 left leg   History of Clostridium difficile    10/ 2014   History of multiple concussions    x6   last one Jan 2017 per pt--  no residual   History of substance abuse ([name])    quit 1997 per pt   History of suicide attempt    05-18-2012  overdose /  failure to thrive   Iron  deficiency anemia due to chronic blood loss    Major  depression, recurrent, chronic ([name])    Osteopenia    Personal history of adenomatous colonic polyps 12/2010, 03/2012   12/2010 - 8 mm serrated adenoma of rectum   Portal vein thrombosis 03/21/2015   right   Primary sclerosing cholangitis    ? 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   Substance abuse ([name]) 1997   Alcohol   Vitamin A  deficiency 05/23/2018   Vitamin B6 deficiency 07/27/2018   Vitamin C deficiency 05/23/2018    Past Surgical History:  Procedure Laterality Date   ABDOMINAL AORTAGRAM N/A 03/29/2012   Procedure: ABDOMINAL Tommi Fraise;  Surgeon: Margherita Shell, MD;  Location: [facility] CATH LAB;  Service: Cardiovascular;  Laterality: N/A;   ADENOIDECTOMY  age 30   BIOPSY  05/31/2018   Procedure: BIOPSY;  Surgeon: Janel Medford, MD;  Location: [facility] ENDOSCOPY;  Service: Endoscopy;;   CATARACT EXTRACTION W/ INTRAOCULAR LENS  IMPLANT, BILATERAL  2009   COLONOSCOPY  2001, 05/02/2003, 01/28/11   2012: Right colon Crohn's, rectal polyp   COLONOSCOPY  03/31/2012   Procedure: COLONOSCOPY;  Surgeon: Nannette Babe, MD;  Location[facility] END[facility]COPY;  Service: Gastroenterology;  Laterality: N/A;   COLONOSCOPY  WITH PROPOFOL  N/A 05/31/2018   Procedure: COLONOSCOPY WITH PROPOFOL ;  Surgeon: Janel Medford, MD;  Location: [facility] ENDOSCOPY;  Service: Endoscopy;  Laterality: N/A;   ESOPHAGOGASTRODUODENOSCOPY  01/28/2011   Normal   FOOT SURGERY Right age 60   MOHS SURGERY Left 11/2013   left ankle parakerotosis    PERCUTANEOUS LIVER BIOPSY  2007 and 2008   PILONIDAL CYST EXCISION  age 62   PLACEMENT OF SETON N/A 11/06/2015   Procedure: PLACEMENT OF SETON;  Surgeon: Joyce Nixon, MD;  Location: [facility];  Service: General;  Laterality: N/A;   PLACEMENT OF SETON  07/2018   at [facility]   RECTAL EXAM UNDER ANESTHESIA N/A 01/18/2019   Procedure: ANAL EXAM UNDER ANESTHESIA,  INCISION AND DRAINAGE, SETON PLACEMENT;  Surgeon:  Joyce Nixon, MD;  Location: [facility];  Service: General;  Laterality: N/A;   UPPER GASTROINTESTINAL ENDOSCOPY      Prior to Admission medications   Medication Sig Start Date End Date Taking? Authorizing Provider  ALAWAY 0.035 % ophthalmic solution Place 1 drop into both eyes 2 (two) times daily as needed (for irritation).   Yes [provider]  hydrOXYzine  (ATARAX ) 25 MG tablet Take 1 tablet (25 mg total) by mouth 3 (three) times daily as needed for anxiety. 05/19/23  Yes Silas Drivers, MD  mirtazapine  (REMERON ) 30 MG tablet Take 0.5 tablets (15 mg total) by mouth at bedtime. 07/05/23  Yes Audria Leather, MD  Nutritional Supplements (ENSURE HIGH PROTEIN) LIQD Take 237 mLs by mouth See admin instructions. Drink 237 ml's of CHOCOLATE HP Ensure by mouth 2 times a day   Yes [provider]  predniSONE  (DELTASONE ) 20 MG tablet TAKE 1.5 TABLETS (30 MG TOTAL) BY MOUTH DAILY WITH BREAKFAST. 06/22/23  Yes Kenney Peacemaker, MD  vancomycin  (VANCOCIN ) 125 MG capsule On day 15 take 1 capsule TID x 7 days then BID x 7 days then daily x 7 days 07/27/23  Yes Kenney Peacemaker, MD  venlafaxine  XR (EFFEXOR -XR) 150 MG 24 hr capsule Take 1 capsule (150 mg total) by mouth daily with breakfast. 05/20/23  Yes Silas Drivers, MD  venlafaxine  XR (EFFEXOR -XR) 75 MG 24 hr capsule Take 75 mg by mouth See admin instructions. Take 75 mg by mouth in the morning with breakfast in conjunction with one 150 mg capsule to equal a total dose of 225 mg   Yes [provider]  Vedolizumab  (ENTYVIO  IV) Inject into the vein every 6 (six) weeks.    [provider]    Current Facility-Administered Medications  Medication Dose Route Frequency Provider Last Rate Last Admin   0.9 %  sodium chloride  infusion   Intravenous Continuous Kenney Peacemaker, MD        Allergies as of 08/03/2023 - Review Complete 07/29/2023  Allergen Reaction Noted   Tape Itching, Rash, and Other (See Comments)  03/21/2015   Asacol  [mesalamine ] Diarrhea and Other (See Comments) 03/09/2011   Azathioprine Diarrhea and Other (See Comments) 08/07/2010   Gluten meal Diarrhea 03/21/2015   Metoprolol  tartrate Nausea Only and Other (See Comments) 01/17/2019   Mycophenolate mofetil Diarrhea and Other (See Comments) 08/07/2010   Other Diarrhea and Other (See Comments) 01/18/2012   Wheat Diarrhea and Other (See Comments) 01/18/2012   Abilify  [aripiprazole ] Diarrhea 05/30/2023   Amoxicillin  Other (See Comments) 08/22/2018    Family History  Problem Relation Age of Onset   Heart disease Father    Thyroid  cancer Father    Allergies  Father    Clotting disorder Father    Breast cancer Mother    Stomach cancer Mother    Colon cancer Neg Hx    Esophageal cancer Neg Hx    Rectal cancer Neg Hx     Social History   Socioeconomic History   Marital status: Single    Spouse name: Not on file   Number of children: 1   Years of education: Not on file   Highest education level: Not on file  Occupational History   Occupation: Disability   Tobacco Use   Smoking status: Former    Current packs/day: 0.00    Average packs/day: 1 pack/day for 15.0 years (15.0 ttl pk-yrs)    Types: Cigarettes    Start date: 03/28/1982    Quit date: 03/28/1997    Years since quitting: 26.4   Smokeless tobacco: Never  Vaping Use   Vaping status: Never Used  Substance and Sexual Activity   Alcohol use: No    Alcohol/week: 0.0 standard drinks of alcohol   Drug use: Not Currently    Types: Cocaine, Marijuana    Comment: QUIT USING DRUGS IN 1997   Sexual activity: Not Currently  Other Topics Concern   Not on file  Social History Narrative   Single, history of substance abuse in recovery   Previously occupied on medical disability   1 child  Daughter Wellsite geologist, lives and works in [place]   Elderly parents involved and he helps them   1 brother   Social Drivers of Corporate investment banker Strain: Low Risk   (08/28/2019)   Overall Financial Resource Strain (CARDIA)    Difficulty of Paying Living Expenses: Not hard at all  Food Insecurity: No Food Insecurity (07/02/2023)   Hunger Vital Sign    Worried About Running Out of Food in the Last Year: Never true    Ran Out of Food in the Last Year: Never true  Recent Concern: Food Insecurity - Food Insecurity Present (04/13/2023)   Hunger Vital Sign    Worried About Running Out of Food in the Last Year: Never true    Ran Out of Food in the Last Year: Sometimes true  Transportation Needs: No Transportation Needs (07/02/2023)   PRAPARE - Administrator, Civil Service (Medical): No    Lack of Transportation (Non-Medical): No  Physical Activity: Insufficiently Active (11/08/2019)   Exercise Vital Sign    Days of Exercise per Week: 3 days    Minutes of Exercise per Session: 30 min  Stress: Stress Concern Present (08/28/2019)   [employer] of Occupational Health - Occupational Stress Questionnaire    Feeling of Stress : Very much  Social Connections: Socially Isolated (07/02/2023)   Social Connection and Isolation Panel    Frequency of Communication with Friends and Family: Once a week    Frequency of Social Gatherings with Friends and Family: Once a week    Attends Religious Services: Never    Database administrator or Organizations: No    Attends Banker Meetings: Never    Marital Status: Never married  Intimate Partner Violence: Not At Risk (07/02/2023)   Humiliation, Afraid, Rape, and Kick questionnaire    Fear of Current or Ex-Partner: No    Emotionally Abused: No    Physically Abused: No    Sexually Abused: No    Review of Systems:  All other review of systems negative except as mentioned in the HPI.  Physical Exam:  Vital signs Temp (!) 97.5 F (36.4 C) (Temporal)   Resp 18   Ht 6' 2 (1.88 m)   Wt 68 kg   SpO2 99%   BMI 19.26 kg/m   General:   Alert,  Well-developed, well-nourished, pleasant and cooperative  in NAD Lungs:  Clear throughout to auscultation.   Heart:  Regular rate and rhythm; no murmurs, clicks, rubs,  or gallops. Abdomen:  Soft, nontender and nondistended. Normal bowel sounds.   Neuro/Psych:  Alert and cooperative. Normal mood and affect. A and O x 3   @Bianco Cange  Tammie Fall, MD, [facility] [phone] (pager) 08/18/2023 12:44 PM@

## 2023-08-18 NOTE — Anesthesia Postprocedure Evaluation (Signed)
 Anesthesia Post Note  Patient: [name]  Procedure(s) Performed: EGD (ESOPHAGOGASTRODUODENOSCOPY) COLONOSCOPY     Patient location during evaluation: PACU Anesthesia Type: MAC Level of consciousness: awake and alert Pain management: pain level controlled Vital Signs Assessment: post-procedure vital signs reviewed and stable Respiratory status: nonlabored ventilation, respiratory function stable and spontaneous breathing Cardiovascular status: stable and blood pressure returned to baseline Postop Assessment: no apparent nausea or vomiting Anesthetic complications: no   No notable events documented.  Last Vitals:  Vitals:   08/18/23 1435 08/18/23 1440  BP: (!) 164/98 (!) 158/99  Pulse: 78 78  Resp: 17 15  Temp:    SpO2: 100% 96%    Last Pain:  Vitals:   08/18/23 1435  TempSrc:   PainSc: 0-No pain                 Erin Havers

## 2023-08-18 NOTE — Transfer of Care (Signed)
 Immediate Anesthesia Transfer of Care Note  Patient: [name]  Procedure(s) Performed: EGD (ESOPHAGOGASTRODUODENOSCOPY) COLONOSCOPY  Patient Location: PACU  Anesthesia Type:MAC  Level of Consciousness: awake, alert , and oriented  Airway & Oxygen Therapy: Patient Spontanous Breathing and Patient connected to face mask oxygen  Post-op Assessment: Report given to RN and Post -op Vital signs reviewed and stable  Post vital signs: Reviewed and stable  Last Vitals:  Vitals Value Taken Time  BP 109/78 08/18/23 14:10  Temp    Pulse 74 08/18/23 14:11  Resp 13 08/18/23 14:11  SpO2 98 % 08/18/23 14:11  Vitals shown include unfiled device data.  Last Pain:  Vitals:   08/18/23 1018  TempSrc: Temporal  PainSc: 0-No pain         Complications: No notable events documented.

## 2023-08-18 NOTE — Anesthesia Preprocedure Evaluation (Signed)
 Anesthesia Evaluation  Patient identified by MRN, date of birth, ID band Patient awake    Reviewed: Allergy & Precautions, NPO status , Patient's Chart, lab work & pertinent test results  Airway Mallampati: II  TM Distance: >3 FB Neck ROM: Full    Dental  (+) Teeth Intact, Dental Advisory Given   Pulmonary former smoker   Pulmonary exam normal breath sounds clear to auscultation       Cardiovascular hypertension, + Peripheral Vascular Disease  Normal cardiovascular exam Rhythm:Regular Rate:Normal     Neuro/Psych  PSYCHIATRIC DISORDERS Anxiety Depression    negative neurological ROS     GI/Hepatic negative GI ROS,,,(+)     substance abuse  alcohol useMetastatic adenocarcinoma of unknown origin  Crohn's disease of both small and large intestine with fistula     Endo/Other  diabetes, Type 2    Renal/GU negative Renal ROS     Musculoskeletal  (+) Arthritis ,  Fibromyalgia -  Abdominal   Peds  Hematology negative hematology ROS (+)   Anesthesia Other Findings Day of surgery medications reviewed with the patient.  Reproductive/Obstetrics                             Anesthesia Physical Anesthesia Plan  ASA: 4  Anesthesia Plan: MAC   Post-op Pain Management:    Induction: Intravenous  PONV Risk Score and Plan: 1 and TIVA and Treatment may vary due to age or medical condition  Airway Management Planned: Natural Airway and Simple Face Mask  Additional Equipment:   Intra-op Plan:   Post-operative Plan:   Informed Consent: I have reviewed the patients History and Physical, chart, labs and discussed the procedure including the risks, benefits and alternatives for the proposed anesthesia with the patient or authorized representative who has indicated his/her understanding and acceptance.     Dental advisory given  Plan Discussed with: CRNA and Anesthesiologist  Anesthesia Plan  Comments:        Anesthesia Quick Evaluation

## 2023-08-18 NOTE — Discharge Instructions (Signed)

## 2023-08-19 ENCOUNTER — Ambulatory Visit: Payer: Self-pay | Admitting: Internal Medicine

## 2023-08-19 LAB — SURGICAL PATHOLOGY

## 2023-08-19 LAB — CEA: CEA: 87.4 ng/mL — ABNORMAL HIGH (ref 0.0–4.7)

## 2023-08-20 ENCOUNTER — Inpatient Hospital Stay (HOSPITAL_COMMUNITY)
Admission: EM | Admit: 2023-08-20 | Discharge: 2023-08-23 | DRG: 920 | Disposition: A | Discharge status: Home / Self Care | Attending: Family Medicine | Admitting: Family Medicine

## 2023-08-20 ENCOUNTER — Emergency Department (HOSPITAL_COMMUNITY)

## 2023-08-20 ENCOUNTER — Other Ambulatory Visit: Payer: Self-pay

## 2023-08-20 ENCOUNTER — Encounter (HOSPITAL_COMMUNITY): Payer: Self-pay

## 2023-08-20 DIAGNOSIS — K5730 Diverticulosis of large intestine without perforation or abscess without bleeding: Secondary | ICD-10-CM | POA: Diagnosis present

## 2023-08-20 DIAGNOSIS — F32A Depression, unspecified: Secondary | ICD-10-CM | POA: Diagnosis present

## 2023-08-20 DIAGNOSIS — A0472 Enterocolitis due to Clostridium difficile, not specified as recurrent: Secondary | ICD-10-CM | POA: Diagnosis present

## 2023-08-20 DIAGNOSIS — G894 Chronic pain syndrome: Secondary | ICD-10-CM | POA: Diagnosis present

## 2023-08-20 DIAGNOSIS — R160 Hepatomegaly, not elsewhere classified: Secondary | ICD-10-CM | POA: Diagnosis not present

## 2023-08-20 DIAGNOSIS — W449XXA Unspecified foreign body entering into or through a natural orifice, initial encounter: Secondary | ICD-10-CM | POA: Diagnosis present

## 2023-08-20 DIAGNOSIS — Z7952 Long term (current) use of systemic steroids: Secondary | ICD-10-CM

## 2023-08-20 DIAGNOSIS — K754 Autoimmune hepatitis: Secondary | ICD-10-CM | POA: Diagnosis present

## 2023-08-20 DIAGNOSIS — K921 Melena: Secondary | ICD-10-CM | POA: Diagnosis not present

## 2023-08-20 DIAGNOSIS — K7460 Unspecified cirrhosis of liver: Secondary | ICD-10-CM | POA: Diagnosis present

## 2023-08-20 DIAGNOSIS — F419 Anxiety disorder, unspecified: Secondary | ICD-10-CM | POA: Diagnosis present

## 2023-08-20 DIAGNOSIS — Z9151 Personal history of suicidal behavior: Secondary | ICD-10-CM

## 2023-08-20 DIAGNOSIS — Z87891 Personal history of nicotine dependence: Secondary | ICD-10-CM

## 2023-08-20 DIAGNOSIS — Z8249 Family history of ischemic heart disease and other diseases of the circulatory system: Secondary | ICD-10-CM

## 2023-08-20 DIAGNOSIS — R197 Diarrhea, unspecified: Secondary | ICD-10-CM | POA: Diagnosis not present

## 2023-08-20 DIAGNOSIS — E876 Hypokalemia: Secondary | ICD-10-CM | POA: Diagnosis present

## 2023-08-20 DIAGNOSIS — C229 Malignant neoplasm of liver, not specified as primary or secondary: Secondary | ICD-10-CM | POA: Diagnosis present

## 2023-08-20 DIAGNOSIS — Z832 Family history of diseases of the blood and blood-forming organs and certain disorders involving the immune mechanism: Secondary | ICD-10-CM

## 2023-08-20 DIAGNOSIS — K922 Gastrointestinal hemorrhage, unspecified: Secondary | ICD-10-CM | POA: Diagnosis not present

## 2023-08-20 DIAGNOSIS — K5790 Diverticulosis of intestine, part unspecified, without perforation or abscess without bleeding: Secondary | ICD-10-CM | POA: Diagnosis not present

## 2023-08-20 DIAGNOSIS — F4310 Post-traumatic stress disorder, unspecified: Secondary | ICD-10-CM | POA: Diagnosis present

## 2023-08-20 DIAGNOSIS — Z79620 Long term (current) use of immunosuppressive biologic: Secondary | ICD-10-CM

## 2023-08-20 DIAGNOSIS — K5090 Crohn's disease, unspecified, without complications: Secondary | ICD-10-CM | POA: Diagnosis not present

## 2023-08-20 DIAGNOSIS — K91840 Postprocedural hemorrhage and hematoma of a digestive system organ or structure following a digestive system procedure: Principal | ICD-10-CM | POA: Diagnosis present

## 2023-08-20 DIAGNOSIS — K648 Other hemorrhoids: Secondary | ICD-10-CM | POA: Diagnosis present

## 2023-08-20 DIAGNOSIS — K2970 Gastritis, unspecified, without bleeding: Secondary | ICD-10-CM | POA: Diagnosis present

## 2023-08-20 DIAGNOSIS — K769 Liver disease, unspecified: Secondary | ICD-10-CM | POA: Diagnosis not present

## 2023-08-20 DIAGNOSIS — Z85828 Personal history of other malignant neoplasm of skin: Secondary | ICD-10-CM

## 2023-08-20 DIAGNOSIS — D509 Iron deficiency anemia, unspecified: Secondary | ICD-10-CM | POA: Diagnosis present

## 2023-08-20 DIAGNOSIS — I739 Peripheral vascular disease, unspecified: Secondary | ICD-10-CM | POA: Diagnosis present

## 2023-08-20 DIAGNOSIS — K317 Polyp of stomach and duodenum: Secondary | ICD-10-CM | POA: Diagnosis present

## 2023-08-20 DIAGNOSIS — K5080 Crohn's disease of both small and large intestine without complications: Secondary | ICD-10-CM | POA: Diagnosis present

## 2023-08-20 DIAGNOSIS — Z79899 Other long term (current) drug therapy: Secondary | ICD-10-CM

## 2023-08-20 DIAGNOSIS — K635 Polyp of colon: Secondary | ICD-10-CM | POA: Diagnosis present

## 2023-08-20 DIAGNOSIS — Z860101 Personal history of adenomatous and serrated colon polyps: Secondary | ICD-10-CM

## 2023-08-20 DIAGNOSIS — T184XXA Foreign body in colon, initial encounter: Secondary | ICD-10-CM | POA: Diagnosis present

## 2023-08-20 DIAGNOSIS — Y838 Other surgical procedures as the cause of abnormal reaction of the patient, or of later complication, without mention of misadventure at the time of the procedure: Secondary | ICD-10-CM | POA: Diagnosis present

## 2023-08-20 LAB — BASIC METABOLIC PANEL WITH GFR
Anion gap: 5 (ref 5–15)
BUN: 24 mg/dL — ABNORMAL HIGH (ref 6–20)
CO2: 30 mmol/L (ref 22–32)
Calcium: 8.4 mg/dL — ABNORMAL LOW (ref 8.9–10.3)
Chloride: 103 mmol/L (ref 98–111)
Creatinine, Ser: 0.97 mg/dL (ref 0.61–1.24)
GFR, Estimated: >60 mL/min (ref >60)
Glucose, Bld: 108 mg/dL — ABNORMAL HIGH (ref 70–99)
Potassium: 3.8 mmol/L (ref 3.5–5.1)
Sodium: 138 mmol/L (ref 135–145)

## 2023-08-20 LAB — CBC WITH DIFFERENTIAL/PLATELET
Abs Immature Granulocytes: 0.43 10*3/uL — ABNORMAL HIGH (ref 0.00–0.07)
Basophils Absolute: 0.1 10*3/uL (ref 0.0–0.1)
Basophils Relative: 1 %
Eosinophils Absolute: 0.1 10*3/uL (ref 0.0–0.5)
Eosinophils Relative: 0 %
HCT: 41.9 % (ref 39.0–52.0)
Hemoglobin: 13.8 g/dL (ref 13.0–17.0)
Immature Granulocytes: 3 %
Lymphocytes Relative: 3 %
Lymphs Abs: 0.4 10*3/uL — ABNORMAL LOW (ref 0.7–4.0)
MCH: 30.8 pg (ref 26.0–34.0)
MCHC: 32.9 g/dL (ref 30.0–36.0)
MCV: 93.5 fL (ref 80.0–100.0)
Monocytes Absolute: 1.1 10*3/uL — ABNORMAL HIGH (ref 0.1–1.0)
Monocytes Relative: 8 %
Neutro Abs: 13.1 10*3/uL — ABNORMAL HIGH (ref 1.7–7.7)
Neutrophils Relative %: 85 %
Platelets: 161 10*3/uL (ref 150–400)
RBC: 4.48 MIL/uL (ref 4.22–5.81)
RDW: 14.7 % (ref 11.5–15.5)
WBC: 15.3 10*3/uL — ABNORMAL HIGH (ref 4.0–10.5)
nRBC: 0 % (ref 0.0–0.2)

## 2023-08-20 LAB — CBC
HCT: 44.1 % (ref 39.0–52.0)
Hemoglobin: 14.7 g/dL (ref 13.0–17.0)
MCH: 31.5 pg (ref 26.0–34.0)
MCHC: 33.3 g/dL (ref 30.0–36.0)
MCV: 94.4 fL (ref 80.0–100.0)
Platelets: 164 10*3/uL (ref 150–400)
RBC: 4.67 MIL/uL (ref 4.22–5.81)
RDW: 14.8 % (ref 11.5–15.5)
WBC: 15.6 10*3/uL — ABNORMAL HIGH (ref 4.0–10.5)
nRBC: 0 % (ref 0.0–0.2)

## 2023-08-20 LAB — PROTIME-INR
INR: 1 (ref 0.8–1.2)
Prothrombin Time: 13.1 s (ref 11.4–15.2)

## 2023-08-20 MED ORDER — VENLAFAXINE HCL ER 37.5 MG PO CP24
225.0000 mg | ORAL_CAPSULE | Freq: Every day | ORAL | Status: DC
  Administered 2023-08-21 – 2023-08-23 (×3): 225 mg via ORAL
  Filled 2023-08-20 (×3): qty 2

## 2023-08-20 MED ORDER — ACETAMINOPHEN 650 MG RE SUPP: 650.0000 mg | Freq: Four times a day (QID) | RECTAL | Status: DC | PRN

## 2023-08-20 MED ORDER — VENLAFAXINE HCL ER 75 MG PO CP24: 75.0000 mg | ORAL_CAPSULE | Freq: Every day | ORAL | Status: DC

## 2023-08-20 MED ORDER — SENNOSIDES-DOCUSATE SODIUM 8.6-50 MG PO TABS: 1.0000 | ORAL_TABLET | Freq: Every evening | ORAL | Status: DC | PRN

## 2023-08-20 MED ORDER — ACETAMINOPHEN 325 MG PO TABS: 650.0000 mg | ORAL_TABLET | Freq: Four times a day (QID) | ORAL | Status: DC | PRN

## 2023-08-20 MED ORDER — VANCOMYCIN HCL 125 MG PO CAPS
125.0000 mg | ORAL_CAPSULE | Freq: Two times a day (BID) | ORAL | Status: DC
  Administered 2023-08-20 – 2023-08-23 (×6): 125 mg via ORAL
  Filled 2023-08-20 (×6): qty 1

## 2023-08-20 MED ORDER — BOOST HIGH PROTEIN PO LIQD
237.0000 mL | Freq: Two times a day (BID) | ORAL | Status: DC
  Administered 2023-08-20 – 2023-08-21 (×2): 237 mL via ORAL
  Filled 2023-08-20 (×3): qty 237

## 2023-08-20 MED ORDER — ONDANSETRON HCL 4 MG PO TABS: 4.0000 mg | ORAL_TABLET | Freq: Four times a day (QID) | ORAL | Status: DC | PRN

## 2023-08-20 MED ORDER — PREDNISONE 5 MG PO TABS
30.0000 mg | ORAL_TABLET | Freq: Every day | ORAL | Status: DC
  Administered 2023-08-21 – 2023-08-23 (×3): 30 mg via ORAL
  Filled 2023-08-20 (×3): qty 2

## 2023-08-20 MED ORDER — HYDROXYZINE HCL 25 MG PO TABS: 25.0000 mg | ORAL_TABLET | Freq: Three times a day (TID) | ORAL | Status: DC | PRN

## 2023-08-20 MED ORDER — IOHEXOL 300 MG/ML  SOLN
100.0000 mL | Freq: Once | INTRAMUSCULAR | Status: AC | PRN
  Administered 2023-08-20: 100 mL via INTRAVENOUS

## 2023-08-20 MED ORDER — MIRTAZAPINE 15 MG PO TABS
30.0000 mg | ORAL_TABLET | Freq: Every day | ORAL | Status: DC
  Administered 2023-08-20 – 2023-08-22 (×3): 30 mg via ORAL
  Filled 2023-08-20 (×3): qty 2

## 2023-08-20 MED ORDER — ONDANSETRON HCL 4 MG/2ML IJ SOLN: 4.0000 mg | Freq: Four times a day (QID) | INTRAMUSCULAR | Status: DC | PRN

## 2023-08-20 NOTE — Care Management Obs Status (Signed)
 MEDICARE OBSERVATION STATUS NOTIFICATION   Patient Details  Name: [name] MRN: [id number] Date of Birth: 1965/11/28   Medicare Observation Status Notification Given:  Yes    Sonda Manuella Quill, RN 08/20/2023, 5:16 PM

## 2023-08-20 NOTE — ED Provider Notes (Signed)
 [place] EMERGENCY DEPARTMENT AT [facility] Provider Note   CSN: [id number] Arrival date & time: 08/20/23  0900     Patient presents with: Rectal Bleeding   [name] is a 58 y.o. male.   58 year old male presenting with bloody diarrhea.  Patient underwent colonoscopy/endoscopy on Thursday, has had 3 bouts of bloody diarrhea since 11 PM last night.  Otherwise he feels well, no abdominal pain, no nausea/vomiting, no fevers no chest pain/shortness of breath, no dizziness/lightheadedness/syncope, no dysuria/hematuria.  He reports bright red and dark red blood in his stool.  History of Crohn's disease and C. difficile, on p.o. vancomycin  and prednisone  currently.  Sees [facility], was recently diagnosed with liver cancer and sees his oncologist on Friday.   Rectal Bleeding      Prior to Admission medications   Medication Sig Start Date End Date Taking? Authorizing Provider  ALAWAY 0.035 % ophthalmic solution Place 1 drop into both eyes 2 (two) times daily as needed (for irritation).    [provider]  hydrOXYzine  (ATARAX ) 25 MG tablet Take 1 tablet (25 mg total) by mouth 3 (three) times daily as needed for anxiety. 05/19/23   Victoria Ruts, MD  mirtazapine  (REMERON ) 30 MG tablet Take 0.5 tablets (15 mg total) by mouth at bedtime. 07/05/23   Cheryle Page, MD  Nutritional Supplements (ENSURE HIGH PROTEIN) LIQD Take 237 mLs by mouth See admin instructions. Drink 237 ml's of CHOCOLATE HP Ensure by mouth 2 times a day    [provider]  predniSONE  (DELTASONE ) 20 MG tablet TAKE 1.5 TABLETS (30 MG TOTAL) BY MOUTH DAILY WITH BREAKFAST. 06/22/23   Avram Lupita BRAVO, MD  vancomycin  (VANCOCIN ) 125 MG capsule On day 15 take 1 capsule TID x 7 days then BID x 7 days then daily x 7 days 07/27/23   Avram Lupita BRAVO, MD  Vedolizumab  (ENTYVIO  IV) Inject into the vein every 6 (six) weeks.    [provider]  venlafaxine  XR (EFFEXOR -XR) 150 MG 24 hr capsule  Take 1 capsule (150 mg total) by mouth daily with breakfast. 05/20/23   Victoria Ruts, MD  venlafaxine  XR (EFFEXOR -XR) 75 MG 24 hr capsule Take 75 mg by mouth See admin instructions. Take 75 mg by mouth in the morning with breakfast in conjunction with one 150 mg capsule to equal a total dose of 225 mg    [provider]    Allergies: Tape, Asacol  [mesalamine ], Azathioprine, Gluten meal, Metoprolol  tartrate, Mycophenolate mofetil, Other, Wheat, Abilify  [aripiprazole ], and Amoxicillin     Review of Systems  Gastrointestinal:  Positive for hematochezia.    Updated Vital Signs BP 134/80 (BP Location: Left Arm)   Pulse 89   Temp 98.2 F (36.8 C) (Oral)   Resp 18   SpO2 99%   Physical Exam Vitals and nursing note reviewed.  HENT:     Head: Normocephalic.   Eyes:     Extraocular Movements: Extraocular movements intact.     Pupils: Pupils are equal, round, and reactive to light.    Cardiovascular:     Rate and Rhythm: Normal rate and regular rhythm.     Heart sounds: Normal heart sounds.  Pulmonary:     Effort: Pulmonary effort is normal.     Breath sounds: Normal breath sounds.  Abdominal:     Palpations: Abdomen is soft.     Tenderness: There is no abdominal tenderness. There is no guarding.     Comments: Hyperactive bowel sounds  Genitourinary:  Comments: Inspection of anorectal area performed with NT Christian at the bedside as chaperone  No tenderness to palpation surrounding the anus, no areas of fluctuance or purulent drainage. Trace bloody/loose stool visualized in between the gluteal folds. No external hemorrhoids or lesions, no perianal opening consistent with recurrent fistula.   Musculoskeletal:     Cervical back: Normal range of motion.     Comments: Moves all extremities spontaneously without difficulty   Skin:    General: Skin is warm and dry.   Neurological:     Mental Status: He is alert and oriented to person, place, and time.     (all  labs ordered are listed, but only abnormal results are displayed) Labs Reviewed  BASIC METABOLIC PANEL WITH GFR - Abnormal; Notable for the following components:      Result Value   Glucose, Bld 108 (*)    BUN 24 (*)    Calcium 8.4 (*)    All other components within normal limits  CBC WITH DIFFERENTIAL/PLATELET - Abnormal; Notable for the following components:   WBC 15.3 (*)    Neutro Abs 13.1 (*)    Lymphs Abs 0.4 (*)    Monocytes Absolute 1.1 (*)    Abs Immature Granulocytes 0.43 (*)    All other components within normal limits    EKG: None  Radiology: CT ABDOMEN PELVIS W CONTRAST Result Date: 08/20/2023 CLINICAL DATA:  New onset bloody stool. History of Crohn's disease. Recent ultrasound-guided liver biopsy. Well differentiated adenocarcinoma on liver biopsy. * Tracking Code: BO *. EXAM: CT ABDOMEN AND PELVIS WITH CONTRAST TECHNIQUE: Multidetector CT imaging of the abdomen and pelvis was performed using the standard protocol following bolus administration of intravenous contrast. RADIATION DOSE REDUCTION: This exam was performed according to the departmental dose-optimization program which includes automated exposure control, adjustment of the mA and/or kV according to patient size and/or use of iterative reconstruction technique. CONTRAST:  OMNIPAQUE  IOHEXOL  300 MG/ML  SOLN COMPARISON:  CT 07/02/2023, MRI 07/03/2023 FINDINGS: Lower chest: No pulmonary nodules the lung bases. Hepatobiliary: Lesion lateral margin of the RIGHT hepatic lobe is increased in the interval from prior CT measuring 4.6 x 3.6 cm compared to 3.7 x 2.8 cm. A small cystic collection adjacent to the expanding mass is not changed measuring 1.3 cm compared to 1.3 cm. Additionally cystic lesion inferior to the hepatic mass measures 4. 5 cm compared to 4.7 cm for no change. Gallbladder unchanged. Several small cystic lesions in the LEFT hepatic lobe unchanged. Small low-density lesions centrally in liver measuring 7 mm  on image 13/2 is indeterminate. Similar lesion in the LEFT hepatic lobe more lateral measuring 6 mm image 18 indeterminate. These lesions are not well appreciated on comparison MRI. Comparison CT with noncontrast. Pancreas: Pancreas is normal. No ductal dilatation. No pancreatic inflammation. Spleen: Normal spleen Adrenals/urinary tract: Adrenal glands and kidneys are normal. The ureters and bladder normal. Stomach/Bowel: Small diverticulum posterior to the gastric fundus. Stomach, duodenum and small-bowel normal. Ascending transverse and descending colon appear normal. There is fluid in the rectum. Vascular/Lymphatic: Abdominal aorta is normal caliber with atherosclerotic calcification. There is no retroperitoneal or periportal lymphadenopathy. No pelvic lymphadenopathy. Reproductive: Prostate unremarkable Other: No free fluid. Musculoskeletal: No aggressive osseous lesion. IMPRESSION: 1. Enlarging mass in the RIGHT hepatic lobe consistent with biopsy-proven adenocarcinoma. 2. Two small hypodense lesions in LEFT hepatic lobe are indeterminate 3. No evidence of bowel inflammation. No stenosis or fistula. Fluid in rectum. Electronically Signed   By: Jackquline Boxer  M.D.   On: 08/20/2023 11:53     Procedures   Medications Ordered in the ED - No data to display                                  Medical Decision Making This patient presents to the ED for concern of bloody diarrhea, this involves an extensive number of treatment options, and is a complaint that carries with it a high risk of complications and morbidity.  The differential diagnosis includes bowel perforation, Crohn's disease flare, gastritis, PUD, other upper/lower GI bleed.   Co morbidities that complicate the patient evaluation  Crohn's disease, recent colonoscopy    Additional history obtained:  Additional history obtained from record review External records from outside source obtained and reviewed including prior  gastroenterology notes   Lab Tests:  I Ordered, and personally interpreted labs.  The pertinent results include: CBC notable for leukocytosis of 15.3, was 12.7 five days ago.  BMP remains relatively stable from previous.   Imaging Studies ordered:  I ordered imaging studies including CT abdomen/pelvis with contrast I independently visualized and interpreted imaging which showed 1. Enlarging mass in the RIGHT hepatic lobe consistent with biopsy-proven adenocarcinoma. 2. Two small hypodense lesions in LEFT hepatic lobe are indeterminate 3. No evidence of bowel inflammation. No stenosis or fistula. Fluid in rectum.  I agree with the radiologist interpretation   Cardiac Monitoring: / EKG:  The patient was maintained on a cardiac monitor.  I personally viewed and interpreted the cardiac monitored which showed an underlying rhythm of: Normal sinus rhythm   Consultations Obtained:  I requested consultation with [facility],  and discussed lab and imaging findings as well as pertinent plan - they recommend: Spoke with [facility] PA-C Amy Esterwood, Dr. Avram will come and evaluate this patient in order to assess his condition and determine next steps in plan. Dr. Avram came and assessed the patient and spoke with me directly, he does feel that this patient would benefit from admission for observation, he is concerned that if GI bleed persists he may have to re-scope him in order to address the issue, see his note for further details. I consulted the hospitalist team who spoke with Dr. Elnor, they are in agreement with this plan.   Problem List / ED Course / Critical interventions / Medication management I have reviewed the patients home medicines and have made adjustments as needed   Social Determinants of Health:  History of tobacco use, social isolation   Test / Admission - Considered:  Physical exam is reassuring, abdomen is soft and nontender, no peritoneal signs,  hyperactive bowel sounds are likely secondary to recent colonoscopy/endoscopy.  Vitals are reassuring, patient is not tachycardic, he is afebrile.  Labs and imaging are notable as above.  See Dr. Darilyn consult note and additional details above.  At this time I am in agreement with Dr. Darilyn plan and do feel that this patient would benefit from admission for observation.     Amount and/or Complexity of Data Reviewed Labs: ordered. Radiology: ordered.  Risk Prescription drug management. Decision regarding hospitalization.        Final diagnoses:  Bloody diarrhea    ED Discharge Orders     None          Glendia Rocky SAILOR, [place] 08/20/23 1411    Elnor Jayson LABOR, DO 08/23/23 1929

## 2023-08-20 NOTE — Consult Note (Signed)
 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  Recent EGD and colonoscopy did not reveal cause.  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  Outpatient oncology appointment pending for next week.  My suspicion is this is likely a biliary primary, probably a cholangiocarcinoma.  Cirrhosis of the liver, autoimmune hepatitis/primary sclerosing cholangitis overlap  Crohn's disease of the small and large intestine with history of fistula and perianal abscesses.  On Entyvio   Chronic steroid therapy for autoimmune diseases of liver and bowel  C. difficile colitis, on vancomycin  taper has been asymptomatic and no evidence of C. difficile on recent colonoscopy  ------------------------------------------------------------------------------------------------------------------------------------  I recommend the patient be admitted for observation.  If the bleeding fails to stop then a repeat colonoscopy for control of bleeding would be appropriate.  Serial hemoglobin  Continue home medications  Check INR though it was 1.0 prior to liver biopsy on May 30 so likely still okay.  Ordered for 1700 along with a repeat CBC  Clear liquid diet  Appreciate TRH assistance         HPI:   [name] is a 58 y.o. male status post colonoscopy June 19 with removal of multiple polyps, a transverse polyp was large and required a clip and application of Purastat hemostasis facilitating agent.  He did well until last night when he started to experience hematochezia and some dark stool consistent with  melena.  No pain.  No normal bowel movement yet.  Denies feeling lightheaded or dizzy.  Presented to the ED where hemoglobin is 13.8.  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  Colonoscopy with polypectomy and biopsy August 18, 2023  - One 20 mm polyp in the proximal transverse colon, removed with a hot snare. Resected and retrieved. Clip (MR conditional) was placed. Clip manufacturer: [employer].This was in the region of a previously placed tattoo where a pseudopolyp had been removed in the past. Also some other diminutive inflammatory polyps scattered about.  - 14 8 to 15 mm polyps in the sigmoid colon, in the descending colon and in the transverse colon, removed with a hot snare. Resected and retrieved.  - Diverticulosis in the sigmoid colon.  - Pseudopolyps in the sigmoid colon, in the descending colon and in the transverse colon. - Internal hemorrhoids.  - The examined portion of the ileum was normal.  - The examination was otherwise normal on direct and retroflexion views.  - Biopsies were taken with a cold forceps for histology in the rectum, in the sigmoid colon, in the descending colon, in the transverse colon, in the ascending colon and in the cecum. There was no evidence of active Crohn's disease in the mucosa other than pseudopolyps and some inflammatory type polyps seen scattered about. These biopsies were taken to reassess for microscopic active Crohn's and/or dysplasia.     EGD with biopsy August 18, 2023  -- Gastritis. Biopsied. -  A single gastric polyp. Biopsied. It was completely removed with 2 bites of the forceps. This looks innocent and benign. - The examination was otherwise normal.  A. STOMACH ANTRUM, BIOPSY: -Gastric mucosa with no specific pathologic diagnosis. -No inflammatory pattern predictive of Helicobacter pylori  infection identified. -Negative for intestinal metaplasia and malignancy.  B. STOMACH BODY, POLYPECTOMY: -Fundic gland polyp.  C. COLON, CECUM, ASCENDING AND TRANSVERSE, BIOPSY: -Mild chronic colitis with mild architectural disarray, negative for active colitis. -Negative for dysplasia and malignancy..  D. COLON, PROXIMAL TRANSVERSE, POLYPECTOMY: -Non-adenomatous polyp, consistent with inflammatory polyp. -Negative for dysplasia and malignancy.  E. COLON, TRANSVERSE, POLYPECTOMY: -Non-adenomatous polyp, consistent with inflammatory polyp. -Negative for dysplasia and malignancy.  F. COLON, DESCENDING AND SIGMOID, POLYPECTOMY: -Non-adenomatous polyp, consistent with inflammatory polyp. -Negative for dysplasia and malignancy.  G.  COLON, DISTAL SIGMOID, POLYPECTOMY: -Non-adenomatous polyp, consistent with inflammatory polyp. -Negative for dysplasia and malignancy.  H.  COLON, DESCENDING, SIGMOID AND RECTUM, BIOPSY: -Hyperplastic polyp, goblet cell-rich type. -Negative for adenomatous change, dysplasia and malignancy.     -------------------------------------------------------------------------------------------------------------------- Summary of gastroenterology problems  Crohn's disease of small and large intestine with history of fistula and perianal abscess   COLONOSCOPY/PATH DX 2001, REPEAT 2005 PATH NEG 2012: Active Crohn's right colon - ileum ok Intolerant of immunomodulators, CellCept, also intolerant of AZA though questionable. 2014 Crohn's flare and ischemic colitis Summer 2014 off prednisone  and back on Humira  2015 colonoscopy ileocolonic inflammation no I think improved 08/30/2016 - Entyvio  to start November 2021 vedolizumab  trough 9.4 mcg/dL going to monthly vedolizumab  2022 every 6-week vedolizumab    Status post colorectal surgery care with seton placement in the past for perirectal fistula and abscess   Posterior anal fissure on and off also  August 18, 2023 colonoscopy numerous pseudo polyps/inflammatory polyps, no endoscopic evidence of active Crohn's though biopsies show some microscopic activity    Iron  deficiency anemia chronic and recurrent has been treated with parenteral iron  at [facility] where he gets his Entyvio  infusions   Autoimmune hepatitis-PSC overlap with cirrhosis (also history of alcohol abuse)   AbnormalLFT's seen 8/ 2001 Marked elevation of transaminases Feb 2007, alkaline phosphatase 157, coags normal Positive anti-smooth muscle antibody at 1-80 February 2007, F. Acton antibody IgG 57 which is elevated, other serologies and celiac profile negative Liver biopsy May 11, 2005: Mild chronic hepatitis inflammation grade 1, no fibrosis identified, 2+ cirrhosis, prednisone  begun with apparent response, reduction in transaminases. The patient has always had minimal symptomatology Immune to HAV and HBV Treated with prednisone  and azathioprine though azathioprine stopped by patient, trial of Cellcept stopped by patient Liver biopsy #2 August 31, 2006 chronic mildly active hepatitis with focal portal fibrosis, iron  staining negative, SPEP normal, ANA negative anti-smooth muscle antibody still positive 01/11/07 MR/MRCP right hepatic lobe ducts irregular and beaded consistent with PSC, minimal perihepatic ascites, o/w negative [facility] evaluation, Dr. Alray April and May 2009, MRCP suggests [facility] and raises ? of cholangiocarcinoma vs fibrosis right lobe of liver, CA 19-9 87 (NL <40), follow-up at [facility] or here suggested with repeat liver biospy to be considered. MR earlier 2010 without signs of cholabgiocarcinoma. Transaminase elevations have been fairly asymptomatic and reduction in levels has not always correlated with prednisone  use. MR MRCP 01/08/2011 shows progressive changes of inflammation in the right hepatic lobe and ducts without mass lesion 12/2011 MRCP 1. Slowly progressive hepatic fibrosis secondary to underlying   sclerosing cholangitis. No mass lesion is identified to suggest  cholangiocarcinoma.  2. Stable intrahepatic biliary dilatation and  beading of the  common bile duct. Possible tiny gallstones. No evidence of  choledocholithiasis. 12/2012 liver bx CHRONIC HEPATITIS WITH MODERATE ACTIVITY AND BRIDGING FIBROSIS restart prednisone  40 mg daily   July 2021 A. LIVER, LEFT, RANDOM, BIOPSY:  - Features of mild large bile duct obstruction, compatible with history  of primary sclerosing cholangitis. See comment  - Minimal portal fibrosis, Stage 0-1 of 4.    May 2025 Liver mass-3.7 x 2.8 x 4.3 cm lateral aspect of hepatic dome.  Biopsy show adenocarcinoma with signet ring cell features favoring a metastasis though could be of biliary origin, gastrointestinal or pancreatic possible also.  Also has an enlarging liver cyst and a stable liver cyst. CA 19-9 897     Portal vein thrombosis treated and resolved with anticoagulation though has left atrophic liver segments as above     History of mesenteric ischemia problems SMA stenosis and briefly went to hospice at this time as he decided to stop all treatment but recovered    Chronic steroid dependence Has not been able to wean steroids ever, is aware of the potential risks.  In general he can only take 20 mg prednisone  tablets.  Dose ranges from 20 to 30 mg daily.   Chronic opioid use with chronic pain syndrome Years of hydrocodone  5-325 mg 1 each day.  August 2024 stopped due to components of the pill (titanium and silica dioxide) but the patient perceives cause gastrointestinal disturbance.  Tried a formulation of acetaminophen  from compound pharmacy but did not tolerate.  March 2025-will not represcribe hydrocodone .   Chronic recurrent depression with a history of avoidant restrictive food intake disorder and anxiety and previous suicide attempt   C. 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 06/15/2018   Cancer of skin of neck    Cataract    Chronic mesenteric ischemia ([name])    Chronic pain syndrome 07/09/2016   Crohn's disease of small and large intestines ([name])    followed by dr lupita Victoriano Campion   Dairy product intolerance    Diarrhea, functional    Family history of adverse reaction to anesthesia    Grover's disease    transient acantholytic dermatosis   History of alcohol abuse    History of basal cell carcinoma excision    2013 left leg   History of Clostridium difficile    10/ 2014   History of multiple concussions    x6   last one Jan 2017 per pt--  no residual   History of substance abuse ([name])    quit 1997 per pt   History of suicide attempt    05-18-2012  overdose /  failure to thrive   Iron  deficiency anemia due to chronic blood loss    Major depression, recurrent, chronic ([name])    Osteopenia    Personal history of adenomatous colonic polyps 12/2010, 03/2012   12/2010 - 8 mm serrated adenoma of rectum   Portal vein thrombosis 03/21/2015   right   Primary sclerosing cholangitis    ? 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   Substance abuse ([name])  1997   Alcohol   Vitamin A  deficiency 05/23/2018   Vitamin B6 deficiency 07/27/2018   Vitamin C deficiency 05/23/2018    Past Surgical History:  Procedure Laterality Date   ABDOMINAL AORTAGRAM N/A 03/29/2012   Procedure: ABDOMINAL EZELLA;  Surgeon: Gaile LELON New, MD;  Location: [facility] CATH LAB;  Service: Cardiovascular;  Laterality: N/A;   ADENOIDECTOMY  age 19   BIOPSY  05/31/2018   Procedure: BIOPSY;  Surgeon: Teressa Toribio SQUIBB, MD;  Location: [facility] ENDOSCOPY;  Service: Endoscopy;;   CATARACT  EXTRACTION W/ INTRAOCULAR LENS  IMPLANT, BILATERAL  2009   COLONOSCOPY  2001, 05/02/2003, 01/28/11   2012: Right colon Crohn's, rectal polyp   COLONOSCOPY  03/31/2012   Procedure: COLONOSCOPY;  Surgeon: Gordy CHRISTELLA Starch, MD;  Location: [facility] ENDOSCOPY;  Service: Gastroenterology;  Laterality: N/A;   COLONOSCOPY WITH PROPOFOL  N/A 05/31/2018   Procedure: COLONOSCOPY WITH PROPOFOL ;  Surgeon: Teressa Toribio SQUIBB, MD;  Location: [facility] ENDOSCOPY;  Service: Endoscopy;  Laterality: N/A;   ESOPHAGOGASTRODUODENOSCOPY  01/28/2011   Normal   FOOT SURGERY Right age 2   MOHS SURGERY Left 11/2013   left ankle parakerotosis    PERCUTANEOUS LIVER BIOPSY  2007 and 2008   PILONIDAL CYST EXCISION  age 54   PLACEMENT OF SETON N/A 11/06/2015   Procedure: PLACEMENT OF SETON;  Surgeon: Bernarda Ned, MD;  Location: [facility];  Service: General;  Laterality: N/A;   PLACEMENT OF SETON  07/2018   at [facility]   RECTAL EXAM UNDER ANESTHESIA N/A 01/18/2019   Procedure: ANAL EXAM UNDER ANESTHESIA,  INCISION AND DRAINAGE, SETON PLACEMENT;  Surgeon: Ned Bernarda, MD;  Location: [facility];  Service: General;  Laterality: N/A;   UPPER GASTROINTESTINAL ENDOSCOPY      Family History  Problem Relation Age of Onset   Heart disease Father    Thyroid  cancer Father    Allergies Father    Clotting disorder Father    Breast cancer Mother    Stomach cancer Mother    Colon cancer Neg Hx    Esophageal cancer Neg Hx    Rectal cancer Neg Hx     Social History   Tobacco Use   Smoking status: Former    Current packs/day: 0.00    Average packs/day: 1 pack/day for 15.0 years (15.0 ttl pk-yrs)    Types: Cigarettes    Start date: 03/28/1982    Quit date: 03/28/1997    Years since quitting: 26.4   Smokeless tobacco: Never  Vaping Use   Vaping status: Never Used  Substance Use Topics   Alcohol use: No    Alcohol/week: 0.0 standard drinks of alcohol   Drug use: Not Currently    Types: Cocaine,  Marijuana    Comment: QUIT USING DRUGS IN 1997    Prior to Admission medications   Medication Sig Start Date End Date Taking? Authorizing Provider  ALAWAY 0.035 % ophthalmic solution Place 1 drop into both eyes 2 (two) times daily as needed (for irritation).    [provider]  hydrOXYzine  (ATARAX ) 25 MG tablet Take 1 tablet (25 mg total) by mouth 3 (three) times daily as needed for anxiety. 05/19/23   Victoria Ruts, MD  mirtazapine  (REMERON ) 30 MG tablet Take 0.5 tablets (15 mg total) by mouth at bedtime. 07/05/23   Cheryle Page, MD  Nutritional Supplements (ENSURE HIGH PROTEIN) LIQD Take 237 mLs by mouth See admin instructions. Drink 237 ml's of CHOCOLATE HP Ensure by mouth 2 times a  day    [provider]  predniSONE  (DELTASONE ) 20 MG tablet TAKE 1.5 TABLETS (30 MG TOTAL) BY MOUTH DAILY WITH BREAKFAST. 06/22/23   Avram Lupita BRAVO, MD  vancomycin  (VANCOCIN ) 125 MG capsule On day 15 take 1 capsule TID x 7 days then BID x 7 days then daily x 7 days 07/27/23   Avram Lupita BRAVO, MD  Vedolizumab  (ENTYVIO  IV) Inject into the vein every 6 (six) weeks.    [provider]  venlafaxine  XR (EFFEXOR -XR) 150 MG 24 hr capsule Take 1 capsule (150 mg total) by mouth daily with breakfast. 05/20/23   Victoria Ruts, MD  venlafaxine  XR (EFFEXOR -XR) 75 MG 24 hr capsule Take 75 mg by mouth See admin instructions. Take 75 mg by mouth in the morning with breakfast in conjunction with one 150 mg capsule to equal a total dose of 225 mg    [provider]    No current facility-administered medications for this encounter.   Current Outpatient Medications  Medication Sig Dispense Refill   ALAWAY 0.035 % ophthalmic solution Place 1 drop into both eyes 2 (two) times daily as needed (for irritation).     hydrOXYzine  (ATARAX ) 25 MG tablet Take 1 tablet (25 mg total) by mouth 3 (three) times daily as needed for anxiety. 30 tablet 0   mirtazapine  (REMERON ) 30 MG tablet Take 0.5 tablets  (15 mg total) by mouth at bedtime.     Nutritional Supplements (ENSURE HIGH PROTEIN) LIQD Take 237 mLs by mouth See admin instructions. Drink 237 ml's of CHOCOLATE HP Ensure by mouth 2 times a day     predniSONE  (DELTASONE ) 20 MG tablet TAKE 1.5 TABLETS (30 MG TOTAL) BY MOUTH DAILY WITH BREAKFAST. 45 tablet 2   vancomycin  (VANCOCIN ) 125 MG capsule On day 15 take 1 capsule TID x 7 days then BID x 7 days then daily x 7 days 42 capsule 0   Vedolizumab  (ENTYVIO  IV) Inject into the vein every 6 (six) weeks.     venlafaxine  XR (EFFEXOR -XR) 150 MG 24 hr capsule Take 1 capsule (150 mg total) by mouth daily with breakfast. 30 capsule 0   venlafaxine  XR (EFFEXOR -XR) 75 MG 24 hr capsule Take 75 mg by mouth See admin instructions. Take 75 mg by mouth in the morning with breakfast in conjunction with one 150 mg capsule to equal a total dose of 225 mg      Allergies as of 08/20/2023 - Review Complete 08/20/2023  Allergen Reaction Noted   Tape Itching, Rash, and Other (See Comments) 03/21/2015   Asacol  [mesalamine ] Diarrhea and Other (See Comments) 03/09/2011   Azathioprine Diarrhea and Other (See Comments) 08/07/2010   Gluten meal Diarrhea 03/21/2015   Metoprolol  tartrate Nausea Only and Other (See Comments) 01/17/2019   Mycophenolate mofetil Diarrhea and Other (See Comments) 08/07/2010   Other Diarrhea and Other (See Comments) 01/18/2012   Wheat Diarrhea and Other (See Comments) 01/18/2012   Abilify  [aripiprazole ] Diarrhea 05/30/2023   Amoxicillin  Other (See Comments) 08/22/2018     Review of Systems:    This is positive for those things mentioned in the HPI.  The patient has been experiencing anxiety and depression which is improved and under treatment. All other review of systems are negative.       Physical Exam:  Vital signs in last 24 hours: Temp:  [98.2 F (36.8 C)-98.6 F (37 C)] 98.6 F (37 C) (06/21 1214) Pulse Rate:  [89-91] 91 (06/21 1214) Resp:  [16-18] 16 (06/21 1214) BP:  (  134-156)/(80-98) 156/98 (06/21 1214) SpO2:  [98 %-99 %] 98 % (06/21 1214)    General:  Well-developed, well-nourished and in no acute distress Eyes:  anicteric. Lungs: Clear to auscultation bilaterally. Heart:   S1S2, no rubs, murmurs, gallops. Abdomen:  soft, non-tender, no hepatosplenomegaly, hernia, or mass and BS+.  Rectal: Perianal erythema with evidence of prior anal fistulotomy and seton placement, stenotic and somewhat tender anal canal.  Maroon stool found. Extremities:   no edema Skin  some ecchymoses scattered about, scattered erythematous papular rash on trunk. Neuro:  A&O x 3.  Psych:  appropriate mood and  Affect.   Data Reviewed:   LAB RESULTS: Recent Labs    08/20/23 1015  WBC 15.3*  HGB 13.8  HCT 41.9  PLT 161   BMET Recent Labs    08/20/23 1015  NA 138  K 3.8  CL 103  CO2 30  GLUCOSE 108*  BUN 24*  CREATININE 0.97  CALCIUM 8.4*    STUDIES: CT ABDOMEN PELVIS W CONTRAST Result Date: 08/20/2023 CLINICAL DATA:  New onset bloody stool. History of Crohn's disease. Recent ultrasound-guided liver biopsy. Well differentiated adenocarcinoma on liver biopsy. * Tracking Code: BO *. EXAM: CT ABDOMEN AND PELVIS WITH CONTRAST TECHNIQUE: Multidetector CT imaging of the abdomen and pelvis was performed using the standard protocol following bolus administration of intravenous contrast. RADIATION DOSE REDUCTION: This exam was performed according to the departmental dose-optimization program which includes automated exposure control, adjustment of the mA and/or kV according to patient size and/or use of iterative reconstruction technique. CONTRAST:  OMNIPAQUE  IOHEXOL  300 MG/ML  SOLN COMPARISON:  CT 07/02/2023, MRI 07/03/2023 FINDINGS: Lower chest: No pulmonary nodules the lung bases. Hepatobiliary: Lesion lateral margin of the RIGHT hepatic lobe is increased in the interval from prior CT measuring 4.6 x 3.6 cm compared to 3.7 x 2.8 cm. A small cystic collection  adjacent to the expanding mass is not changed measuring 1.3 cm compared to 1.3 cm. Additionally cystic lesion inferior to the hepatic mass measures 4. 5 cm compared to 4.7 cm for no change. Gallbladder unchanged. Several small cystic lesions in the LEFT hepatic lobe unchanged. Small low-density lesions centrally in liver measuring 7 mm on image 13/2 is indeterminate. Similar lesion in the LEFT hepatic lobe more lateral measuring 6 mm image 18 indeterminate. These lesions are not well appreciated on comparison MRI. Comparison CT with noncontrast. Pancreas: Pancreas is normal. No ductal dilatation. No pancreatic inflammation. Spleen: Normal spleen Adrenals/urinary tract: Adrenal glands and kidneys are normal. The ureters and bladder normal. Stomach/Bowel: Small diverticulum posterior to the gastric fundus. Stomach, duodenum and small-bowel normal. Ascending transverse and descending colon appear normal. There is fluid in the rectum. Vascular/Lymphatic: Abdominal aorta is normal caliber with atherosclerotic calcification. There is no retroperitoneal or periportal lymphadenopathy. No pelvic lymphadenopathy. Reproductive: Prostate unremarkable Other: No free fluid. Musculoskeletal: No aggressive osseous lesion. IMPRESSION: 1. Enlarging mass in the RIGHT hepatic lobe consistent with biopsy-proven adenocarcinoma. 2. Two small hypodense lesions in LEFT hepatic lobe are indeterminate 3. No evidence of bowel inflammation. No stenosis or fistula. Fluid in rectum. Electronically Signed   By: Jackquline Boxer M.D.   On: 08/20/2023 11:53       Thanks   LOS: 0 days   @Antonia Jicha  CHARLENA Commander, MD, [facility] @  08/20/2023, 1:12 PM

## 2023-08-20 NOTE — H&P (Addendum)
 History and Physical  [name]:[id number] DOB: [birth year] DOA: 08/20/2023  PCP: [name], MD   Chief Complaint: Bloody diarrhea  HPI: [name] is a 58 y.o. male with medical history significant for cirrhosis of the liver, PSC/autoimmune hepatitis overlap, Crohn's colitis on chronic prednisone , anxiety and depression, PTSD, osteoarthritis, and a recent diagnosis of adenocarcinoma of the liver who presents to the ED for evaluation of bloody diarrhea after recent EGD/colonoscopy.  Patient was recently diagnosed with adenocarcinoma of the liver with biopsy showing features favoring metastasis.  Due to his history of Crohn's, patient underwent EGD and colonoscopy on 6/19 to rule out colon cancer.  One large 20 mL polyp as well as 14 small polyps were found in the colon and resected with placement of hemostatic clip on the large polyp site.  Patient with ports he had no symptoms the next day after the procedure however last night, he started having loose bloody stools.  It continued until this morning so he called EMS.  Reports stools that are bright red blood completely loose, worse after eating.  He denies any abdominal pain, dizziness, headache, rectal pain, dark stools, chest pain or shortness of breath.  ED Course: Initial vitals relatively stable, SBP in the 130s to 160s. Initial labs significant for WBC 15.3, Hgb 13.8, platelet 161, normal kidney function and PT/INR.  CT A/P shows the previously enlarged right hepatic lobe mass with 2 small lesions in the left hepatic lobe and some fluid in the rectum but no evidence of bowel inflammation. GI, Dr. Avram, was consulted for evaluation. [facility] was consulted for admission.   Review of Systems: Please see HPI for pertinent positives and negatives. A complete 10 system review of systems are otherwise negative.  Past Medical History:  Diagnosis Date   Allergy    Anal fistula    Anxiety    Arthritis    ? of migratory  arthritis   Autoimmune hepatitis ([name]) 01/19/2013   liver function checked every 2 or 3 months sees dr avram   Avoidant-restrictive food intake disorder (ARFID) ? 06/15/2018   Cancer of skin of neck    Cataract    Chronic mesenteric ischemia ([name])    Chronic pain syndrome 07/09/2016   Crohn's disease of small and large intestines ([name])    followed by dr lupita gessner   Dairy product intolerance    Diarrhea, functional    Family history of adverse reaction to anesthesia    Grover's disease    transient acantholytic dermatosis   History of alcohol abuse    History of basal cell carcinoma excision    2013 left leg   History of Clostridium difficile    10/ 2014   History of multiple concussions    x6   last one Jan 2017 per pt--  no residual   History of substance abuse ([name])    quit 1997 per pt   History of suicide attempt    05-18-2012  overdose /  failure to thrive   Iron  deficiency anemia due to chronic blood loss    Major depression, recurrent, chronic ([name])    Osteopenia    Personal history of adenomatous colonic polyps 12/2010, 03/2012   12/2010 - 8 mm serrated adenoma of rectum   Portal vein thrombosis 03/21/2015   right   Primary sclerosing cholangitis    ? 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  Substance abuse ([name]) 1997   Alcohol   Vitamin A  deficiency 05/23/2018   Vitamin B6 deficiency 07/27/2018   Vitamin C deficiency 05/23/2018   Past Surgical History:  Procedure Laterality Date   ABDOMINAL AORTAGRAM N/A 03/29/2012   Procedure: ABDOMINAL EZELLA;  Surgeon: Gaile LELON New, MD;  Location: [facility] CATH LAB;  Service: Cardiovascular;  Laterality: N/A;   ADENOIDECTOMY  age 33   BIOPSY  05/31/2018   Procedure: BIOPSY;  Surgeon: Teressa Toribio SQUIBB, MD;  Location: [facility] ENDOSCOPY;  Service: Endoscopy;;   CATARACT EXTRACTION W/ INTRAOCULAR LENS  IMPLANT, BILATERAL  2009   COLONOSCOPY  2001, 05/02/2003,  01/28/11   2012: Right colon Crohn's, rectal polyp   COLONOSCOPY  03/31/2012   Procedure: COLONOSCOPY;  Surgeon: Gordy CHRISTELLA Starch, MD;  Location: [facility] ENDOSCOPY;  Service: Gastroenterology;  Laterality: N/A;   COLONOSCOPY N/A 08/18/2023   Procedure: COLONOSCOPY;  Surgeon: Avram Lupita BRAVO, MD;  Location: [facility] ENDOSCOPY;  Service: Gastroenterology;  Laterality: N/A;   COLONOSCOPY WITH PROPOFOL  N/A 05/31/2018   Procedure: COLONOSCOPY WITH PROPOFOL ;  Surgeon: Teressa Toribio SQUIBB, MD;  Location: [facility] ENDOS[facility];  Service: Endoscopy;  Laterality: N/A;   ESOPHAGOGASTRODUODENOSCOPY  01/28/2011   Normal   ESOPHAGOGASTRODUODENOSCOPY N/A 08/18/2023   Procedure: EGD (ESOPHAGOGASTRODUODENOSCOPY);  Surgeon: Avram Lupita BRAVO, MD;  Location: [facility];  Service: Gastroenterology;  Laterality: N/A;   FOOT SURGERY Right age 24   MOHS SURGERY Left 11/2013   left ankle parakerotosis    PERCUTANEOUS LIVER BIOPSY  2007 and 2008   PILONIDAL CYST EXCISION  age 1   PLACEMENT OF SETON N/A 11/06/2015   Procedure: PLACEMENT OF SETON;  Surgeon: Bernarda Ned, MD;  Location: [facility];  Service: General;  Laterality: N/A;   PLACEMENT OF SETON  07/2018   at [facility]   RECTAL EXAM UNDER ANESTHESIA N/A 01/18/2019   Procedure: ANAL EXAM UNDER ANESTHESIA,  INCISION AND DRAINAGE, SETON PLACEMENT;  Surgeon: Ned Bernarda, MD;  Location: [facility] ;  Service: General;  Laterality: N/A;   UPPER GASTROINTESTINAL ENDOSCOPY     Social History:  reports that he quit smoking about 26 years ago. His smoking use included cigarettes. He started smoking about 41 years ago. He has a 15 pack-year smoking history. He has never used smokeless tobacco. He reports that he does not currently use drugs after having used the following drugs: Cocaine and Marijuana. He reports that he does not drink alcohol.  Allergies  Allergen Reactions   Tape Itching, Rash and Other (See Comments)    Please use paper tape   Asacol   [Mesalamine ] Diarrhea and Other (See Comments)    Abd pain, too   Azathioprine Diarrhea and Other (See Comments)    Abd pain, too   Gluten Meal Diarrhea   Metoprolol  Tartrate Nausea Only and Other (See Comments)    Stomach upset   Mycophenolate Mofetil Diarrhea and Other (See Comments)    Abd pain, too   Other Diarrhea and Other (See Comments)    NUTS   Wheat Diarrhea and Other (See Comments)    Constipation; flatulence; abd pain (can tolerate sometimes)   Abilify  [Aripiprazole ] Diarrhea   Amoxicillin  Other (See Comments)    Lots of gas    Family History  Problem Relation Age of Onset   Heart disease Father    Thyroid  cancer Father    Allergies Father    Clotting disorder Father    Breast cancer Mother    Stomach cancer Mother  Colon cancer Neg Hx    Esophageal cancer Neg Hx    Rectal cancer Neg Hx      Prior to Admission medications   Medication Sig Start Date End Date Taking? Authorizing Provider  hydrOXYzine  (ATARAX ) 25 MG tablet Take 1 tablet (25 mg total) by mouth 3 (three) times daily as needed for anxiety. Patient taking differently: Take 25 mg by mouth in the morning and at bedtime. 05/19/23  Yes Victoria Ruts, MD  mirtazapine  (REMERON ) 30 MG tablet Take 0.5 tablets (15 mg total) by mouth at bedtime. Patient taking differently: Take 30 mg by mouth at bedtime. 07/05/23  Yes Cheryle Page, MD  Nutritional Supplements (ENSURE HIGH PROTEIN) LIQD Take 237 mLs by mouth in the morning and at bedtime.   Yes [provider]  predniSONE  (DELTASONE ) 20 MG tablet TAKE 1.5 TABLETS (30 MG TOTAL) BY MOUTH DAILY WITH BREAKFAST. 06/22/23  Yes Avram Lupita BRAVO, MD  vancomycin  (VANCOCIN ) 125 MG capsule On day 15 take 1 capsule TID x 7 days then BID x 7 days then daily x 7 days Patient taking differently: Take 125 mg by mouth See admin instructions. Take 250 mg by mouth twice daily for 7 days, then take 125 mg three times daily for 7 days, then take 125  twice a day for 7  days, then take 125 mg once daily for 7 days. 07/27/23  Yes Avram Lupita BRAVO, MD  Vedolizumab  (ENTYVIO  IV) Inject into the vein every 6 (six) weeks.   Yes [provider]  venlafaxine  XR (EFFEXOR -XR) 150 MG 24 hr capsule Take 1 capsule (150 mg total) by mouth daily with breakfast. 05/20/23  Yes Victoria Ruts, MD  venlafaxine  XR (EFFEXOR -XR) 75 MG 24 hr capsule Take 75 mg by mouth daily.   Yes [provider]  busPIRone  (BUSPAR ) 7.5 MG tablet Take 7.5 mg by mouth 3 (three) times daily. Patient not taking: Reported on 08/20/2023    [provider]  LORazepam  (ATIVAN ) 0.5 MG tablet Take 0.5 mg by mouth at bedtime as needed. Patient not taking: Reported on 08/20/2023 07/11/23   [provider]    Physical Exam: BP (!) 164/99 (BP Location: Right Arm)   Pulse 84   Temp 98.5 F (36.9 C) (Oral)   Resp 18   Ht 6' 2 (1.88 m)   Wt 68.2 kg   SpO2 98%   BMI 19.30 kg/m  General: Pleasant, well-appearing man laying in bed. No acute distress. HEENT: [place]/AT. Anicteric sclera CV: RRR. No murmurs, rubs, or gallops. No LE edema Pulmonary: Lungs CTAB. Normal effort. No wheezing or rales. Abdominal: Soft, nontender, nondistended. Normal bowel sounds. Extremities: Palpable radial and DP pulses. Normal ROM. Skin: Warm and dry. No obvious rash or lesions. Neuro: A&Ox3. Moves all extremities. Normal sensation to light touch. No focal deficit. Psych: Normal mood and affect          Labs on Admission:  Basic Metabolic Panel: Recent Labs  Lab 08/15/23 1223 08/20/23 1015  NA 140 138  K 4.0 3.8  CL 103 103  CO2 29 30  GLUCOSE 145* 108*  BUN 24* 24*  CREATININE 1.16 0.97  CALCIUM 9.0 8.4*   Liver Function Tests: Recent Labs  Lab 08/15/23 1223  AST 43*  ALT 80*  ALKPHOS 158*  BILITOT 0.4  PROT 5.6*  ALBUMIN 3.6   No results for input(s): LIPASE, AMYLASE in the last 168 hours. No results for input(s): AMMONIA in the last 168 hours. CBC: Recent Labs  Lab 08/15/23 1223 08/20/23 1015 08/20/23 1713  WBC 12.7* 15.3* 15.6*  NEUTROABS 12.0* 13.1*  --   HGB 15.6 13.8 14.7  HCT 45.8 41.9 44.1  MCV 90.8 93.5 94.4  PLT 197.0 161 164   Cardiac Enzymes: No results for input(s): CKTOTAL, CKMB, CKMBINDEX, TROPONINI in the last 168 hours. BNP (last 3 results) No results for input(s): BNP in the last 8760 hours.  ProBNP (last 3 results) No results for input(s): PROBNP in the last 8760 hours.  CBG: No results for input(s): GLUCAP in the last 168 hours.  Radiological Exams on Admission: CT ABDOMEN PELVIS W CONTRAST Result Date: 08/20/2023 CLINICAL DATA:  New onset bloody stool. History of Crohn's disease. Recent ultrasound-guided liver biopsy. Well differentiated adenocarcinoma on liver biopsy. * Tracking Code: BO *. EXAM: CT ABDOMEN AND PELVIS WITH CONTRAST TECHNIQUE: Multidetector CT imaging of the abdomen and pelvis was performed using the standard protocol following bolus administration of intravenous contrast. RADIATION DOSE REDUCTION: This exam was performed according to the departmental dose-optimization program which includes automated exposure control, adjustment of the mA and/or kV according to patient size and/or use of iterative reconstruction technique. CONTRAST:  OMNIPAQUE  IOHEXOL  300 MG/ML  SOLN COMPARISON:  CT 07/02/2023, MRI 07/03/2023 FINDINGS: Lower chest: No pulmonary nodules the lung bases. Hepatobiliary: Lesion lateral margin of the RIGHT hepatic lobe is increased in the interval from prior CT measuring 4.6 x 3.6 cm compared to 3.7 x 2.8 cm. A small cystic collection adjacent to the expanding mass is not changed measuring 1.3 cm compared to 1.3 cm. Additionally cystic lesion inferior to the hepatic mass measures 4. 5 cm compared to 4.7 cm for no change. Gallbladder unchanged. Several small cystic lesions in the LEFT hepatic lobe unchanged. Small low-density lesions centrally in liver measuring 7 mm on image 13/2  is indeterminate. Similar lesion in the LEFT hepatic lobe more lateral measuring 6 mm image 18 indeterminate. These lesions are not well appreciated on comparison MRI. Comparison CT with noncontrast. Pancreas: Pancreas is normal. No ductal dilatation. No pancreatic inflammation. Spleen: Normal spleen Adrenals/urinary tract: Adrenal glands and kidneys are normal. The ureters and bladder normal. Stomach/Bowel: Small diverticulum posterior to the gastric fundus. Stomach, duodenum and small-bowel normal. Ascending transverse and descending colon appear normal. There is fluid in the rectum. Vascular/Lymphatic: Abdominal aorta is normal caliber with atherosclerotic calcification. There is no retroperitoneal or periportal lymphadenopathy. No pelvic lymphadenopathy. Reproductive: Prostate unremarkable Other: No free fluid. Musculoskeletal: No aggressive osseous lesion. IMPRESSION: 1. Enlarging mass in the RIGHT hepatic lobe consistent with biopsy-proven adenocarcinoma. 2. Two small hypodense lesions in LEFT hepatic lobe are indeterminate 3. No evidence of bowel inflammation. No stenosis or fistula. Fluid in rectum. Electronically Signed   By: Jackquline Boxer M.D.   On: 08/20/2023 11:53   Assessment/Plan [name] is a 58 y.o. male with medical history significant for cirrhosis of the liver, PSC/autoimmune hepatitis overlap, Crohn's colitis on chronic prednisone , anxiety and depression, PTSD, osteoarthritis, and a recent diagnosis of adenocarcinoma of the liver who presents to the ED for evaluation of bloody diarrhea after recent EGD/colonoscopy and admitted for lower GI bleed.   # GI bleed - Patient presented with 1 day of bloody diarrhea after recent EGD/colonoscopy - Hgb still stable at 13.8 with repeat actually improved to 14.7 - GI bleed likely lower GI bleed from polypectomy bleed - No further bloody stools since admission - GI consulted, recommends observation overnight - Trend CBC and Transfuse  for Hgb goal >  7  # Adenocarcinoma of the liver - Etiology still unclear, suspicious for for biliary tract origin versus GI tract origin - CEA and CA 19.9 tumor levels both elevated - Follow-up biopsy of colon polyps  # PSC/autoimmune hepatitis # Cirrhosis of the liver - Chronic and stable - Continue prednisone  - Follow-up CMP  #Hx of Crohn's colitis - Continue vancomycin  taper, currently on twice daily dose for the next 7 days - Continue prednisone   # Anxiety and depression # PTSD - Continue venlafaxine , mirtazapine  and as needed hydroxyzine    DVT prophylaxis: SCDs    Code Status: Full Code  Consults called: GI  Family Communication: No family at bedside  Severity of Illness: The appropriate patient status for this patient is OBSERVATION. Observation status is judged to be reasonable and necessary in order to provide the required intensity of service to ensure the patient's safety. The patient's presenting symptoms, physical exam findings, and initial radiographic and laboratory data in the context of their medical condition is felt to place them at decreased risk for further clinical deterioration. Furthermore, it is anticipated that the patient will be medically stable for discharge from the hospital within 2 midnights of admission.   Level of care: Med-Surg   This record has been created using Conservation officer, historic buildings. Errors have been sought and corrected, but may not always be located. Such creation errors do not reflect on the standard of care.   Lou Claretta HERO, MD 08/20/2023, 6:52 PM [facility] Pager: [phone] Isaiah 41:10   If 7PM-7AM, please contact night-coverage [email or web address] Password [email or web address]

## 2023-08-20 NOTE — TOC Initial Note (Signed)
 Transition of Care [facility]) - Initial/Assessment Note    Patient Details  Name: [name] MRN: [id number] Date of Birth: May 19, 1965  [facility]) CM/SW Contact:    Sonda Manuella Quill, RN Phone Number: 08/20/2023, 5:18 PM  Clinical Narrative:                 [place] w/ pt in room; pt says he lives at home w/ his parents; he plans to return at d/c; pt identified POC brother Keigo Whalley [phone]); his brother will provide transportation; pt verified insurance/PCP; he denied SDOH risks; pt says he does not have DME, HH services, or home oxygen; TOC will follow.  Expected Discharge Plan: Home/Self Care Barriers to Discharge: Continued Medical Work up   Patient Goals and CMS Choice Patient states their goals for this hospitalization and ongoing recovery are:: home CMS Medicare.gov Compare Post Acute Care list provided to:: Patient   [place] ownership interest in [facility].provided to:: Patient    Expected Discharge Plan and Services   Discharge Planning Services: CM Consult   Living arrangements for the past 2 months: Single Family Home                                      Prior Living Arrangements/Services Living arrangements for the past 2 months: Single Family Home Lives with:: Parents Patient language and need for interpreter reviewed:: Yes Do you feel safe going back to the place where you live?: Yes      Need for Family Participation in Patient Care: Yes (Comment) Care giver support system in place?: Yes (comment) Current home services:  (n/a) Criminal Activity/Legal Involvement Pertinent to Current Situation/Hospitalization: No - Comment as needed  Activities of Daily Living   ADL Screening (condition at time of admission) Independently performs ADLs?: Yes (appropriate for developmental age) Is the patient deaf or have difficulty hearing?: No Does the patient have difficulty seeing, even when wearing  glasses/contacts?: No Does the patient have difficulty concentrating, remembering, or making decisions?: No  Permission Sought/Granted Permission sought to share information with : Case Manager Permission granted to share information with : Yes, Verbal Permission Granted  Share Information with NAME: Case Manager     Permission granted to share info w Relationship: Rad Gramling (brother) [phone]     Emotional Assessment Appearance:: Appears stated age Attitude/Demeanor/Rapport: Gracious Affect (typically observed): Accepting Orientation: : Oriented to Self, Oriented to Place, Oriented to  Time, Oriented to Situation Alcohol / Substance Use: Not Applicable Psych Involvement: No (comment)  Admission diagnosis:  GI bleed [K92.2] Bloody diarrhea [R19.7] Patient Active Problem List   Diagnosis Date Noted   GI bleed 08/20/2023   Metastatic adenocarcinoma ([name]) 08/18/2023   Benign neoplasm of transverse colon 08/18/2023   Benign neoplasm of descending colon 08/18/2023   Benign neoplasm of sigmoid colon 08/18/2023   Liver lesion, right lobe 07/04/2023   Clostridium difficile colitis 07/04/2023   Abnormal finding on imaging of liver 07/03/2023   PSC (primary sclerosing cholangitis) 07/03/2023   Major depressive disorder, recurrent episode, moderate ([name]) 07/03/2023   Exacerbation of Crohn's disease ([name]) 07/02/2023   AKI (acute kidney injury) ([name]) 07/02/2023   Protein-calorie malnutrition, severe ([name]) 07/02/2023   Transaminitis 07/02/2023   Thrombocytopenia ([name]) 07/02/2023   Near syncope 07/02/2023   Leukocytosis 07/02/2023   Tremor 04/14/2023   MDD (major depressive disorder) 04/13/2023   Crohn's disease ([name]) 03/29/2023  Steroid-induced diabetes ([name]) 03/15/2022   Encounter for long-term use of opiate analgesic 02/08/2021   Iron  deficiency anemia 08/11/2020   Current mild episode of major depressive disorder ([name]) 08/28/2019   GAD (generalized anxiety disorder)  08/28/2019   PTSD (post-traumatic stress disorder) 08/14/2019   Vitamin B6 deficiency 07/27/2018   Vitamin E deficiency 07/27/2018   Dilated intrahepatic bile ducts    Vitamin C deficiency 05/23/2018   Vitamin A  deficiency 05/23/2018   Grover's disease 11/04/2017   Chronic pain syndrome 07/09/2016   Perirectal fistula and abscess 05/18/2016   Insomnia 11/29/2014   Long-term use of immunosuppressant medication - Entyvio  11/28/2014   Posterior Anal fissure 10/03/2014   Anemia due to chronic blood loss 02/11/2014   Autoimmune hepatitis-PSC overlap 01/19/2013   Rash on trunk 12/31/2012   Enteritis due to Clostridium difficile 10/23/2012   Arthralgia 08/28/2012   Hypokalemia 05/18/2012   MDD (major depressive disorder), recurrent severe, without psychosis ([name]) 04/12/2012   Dehydration 03/25/2012   History of colonic polyps 01/18/2012   Osteopenia 02/17/2011   Long term current use of systemic steroids 08/11/2010   Essential hypertension 08/09/2008   Vitamin D  deficiency 09/26/2007   Hyperlipidemia 09/26/2007   CROHN'S DISEASE, LARGE AND SMALL INTESTINES 03/28/2007   PCP:  [name], MD Pharmacy:   [facility] [phone] - [place], [place] - [address] [place] [address] Phone: [phone] Fax: [phone]  [facility] [place], [place] - [address] [place] [address] Phone: [phone] Fax: [phone]  [facility] - [place], [place] - [address] [place] [address] Phone: [phone] Fax: [phone]     Social Drivers of Health (SDOH) Social History: SDOH Screenings   Food Insecurity: No Food Insecurity (08/20/2023)  Housing: Low Risk  (08/20/2023)  Transportation Needs: No Transportation Needs (08/20/2023)  Utilities: Not At Risk (08/20/2023)  Alcohol Screen: Low Risk  (04/30/2023)   Depression (PHQ2-9): Medium Risk (06/06/2020)  Financial Resource Strain: Low Risk  (08/28/2019)  Physical Activity: Insufficiently Active (11/08/2019)  Social Connections: Socially Isolated (07/02/2023)  Stress: Stress Concern Present (08/28/2019)  Tobacco Use: Medium Risk (08/20/2023)   SDOH Interventions: Food Insecurity Interventions: Intervention Not Indicated, Inpatient TOC Housing Interventions: Intervention Not Indicated, Inpatient TOC Transportation Interventions: Intervention Not Indicated, Inpatient TOC Utilities Interventions: Intervention Not Indicated, Inpatient TOC   Readmission Risk Interventions    07/04/2023   11:30 AM  Readmission Risk Prevention Plan  Transportation Screening Complete  PCP or Specialist Appt within 3-5 Days Complete  HRI or Home Care Consult Complete  Social Work Consult for Recovery Care Planning/Counseling Complete  Palliative Care Screening Complete  Medication Review Oceanographer) Complete

## 2023-08-20 NOTE — ED Triage Notes (Addendum)
 Patient BIB [facility] from family members home. Patient states he had a colonoscopy and Endoscopy on Thursday 0619/2025. Patient has had 3 Colonoscopies in the past. Patient states around 2300 last night patient began to have loose bloody stools. Patient states that he has had 2 this morning before calling EMS. Patient denies dizziness and abdominal pain. Patients abdomen is soft and non distended. Patient does have a hx of Crohns disease. Patient did have C. Diff the beginning of May and was diagnosed again at the end of May. He is currently taking vancomyocin.  Vitals upon arrival were:  140/82 BP 105 HR 131 CBG 16 RR

## 2023-08-21 DIAGNOSIS — C229 Malignant neoplasm of liver, not specified as primary or secondary: Secondary | ICD-10-CM | POA: Diagnosis present

## 2023-08-21 DIAGNOSIS — K754 Autoimmune hepatitis: Secondary | ICD-10-CM | POA: Diagnosis present

## 2023-08-21 DIAGNOSIS — D125 Benign neoplasm of sigmoid colon: Secondary | ICD-10-CM | POA: Diagnosis not present

## 2023-08-21 DIAGNOSIS — I10 Essential (primary) hypertension: Secondary | ICD-10-CM | POA: Diagnosis not present

## 2023-08-21 DIAGNOSIS — Z9151 Personal history of suicidal behavior: Secondary | ICD-10-CM | POA: Diagnosis not present

## 2023-08-21 DIAGNOSIS — Z860101 Personal history of adenomatous and serrated colon polyps: Secondary | ICD-10-CM | POA: Diagnosis not present

## 2023-08-21 DIAGNOSIS — G894 Chronic pain syndrome: Secondary | ICD-10-CM | POA: Diagnosis present

## 2023-08-21 DIAGNOSIS — F4310 Post-traumatic stress disorder, unspecified: Secondary | ICD-10-CM | POA: Diagnosis present

## 2023-08-21 DIAGNOSIS — K317 Polyp of stomach and duodenum: Secondary | ICD-10-CM | POA: Diagnosis present

## 2023-08-21 DIAGNOSIS — W449XXA Unspecified foreign body entering into or through a natural orifice, initial encounter: Secondary | ICD-10-CM | POA: Diagnosis present

## 2023-08-21 DIAGNOSIS — K5080 Crohn's disease of both small and large intestine without complications: Secondary | ICD-10-CM | POA: Diagnosis present

## 2023-08-21 DIAGNOSIS — K5730 Diverticulosis of large intestine without perforation or abscess without bleeding: Secondary | ICD-10-CM | POA: Diagnosis present

## 2023-08-21 DIAGNOSIS — F32A Depression, unspecified: Secondary | ICD-10-CM | POA: Diagnosis present

## 2023-08-21 DIAGNOSIS — T184XXA Foreign body in colon, initial encounter: Secondary | ICD-10-CM | POA: Diagnosis present

## 2023-08-21 DIAGNOSIS — E785 Hyperlipidemia, unspecified: Secondary | ICD-10-CM | POA: Diagnosis not present

## 2023-08-21 DIAGNOSIS — K920 Hematemesis: Secondary | ICD-10-CM | POA: Diagnosis not present

## 2023-08-21 DIAGNOSIS — K5140 Inflammatory polyps of colon without complications: Secondary | ICD-10-CM | POA: Diagnosis not present

## 2023-08-21 DIAGNOSIS — Z79899 Other long term (current) drug therapy: Secondary | ICD-10-CM | POA: Diagnosis not present

## 2023-08-21 DIAGNOSIS — K922 Gastrointestinal hemorrhage, unspecified: Secondary | ICD-10-CM | POA: Diagnosis present

## 2023-08-21 DIAGNOSIS — K91840 Postprocedural hemorrhage and hematoma of a digestive system organ or structure following a digestive system procedure: Secondary | ICD-10-CM | POA: Diagnosis present

## 2023-08-21 DIAGNOSIS — I739 Peripheral vascular disease, unspecified: Secondary | ICD-10-CM | POA: Diagnosis present

## 2023-08-21 DIAGNOSIS — E876 Hypokalemia: Secondary | ICD-10-CM | POA: Diagnosis present

## 2023-08-21 DIAGNOSIS — Z7952 Long term (current) use of systemic steroids: Secondary | ICD-10-CM | POA: Diagnosis not present

## 2023-08-21 DIAGNOSIS — K648 Other hemorrhoids: Secondary | ICD-10-CM | POA: Diagnosis not present

## 2023-08-21 DIAGNOSIS — Z85828 Personal history of other malignant neoplasm of skin: Secondary | ICD-10-CM | POA: Diagnosis not present

## 2023-08-21 DIAGNOSIS — F419 Anxiety disorder, unspecified: Secondary | ICD-10-CM | POA: Diagnosis present

## 2023-08-21 DIAGNOSIS — D509 Iron deficiency anemia, unspecified: Secondary | ICD-10-CM | POA: Diagnosis present

## 2023-08-21 DIAGNOSIS — Y838 Other surgical procedures as the cause of abnormal reaction of the patient, or of later complication, without mention of misadventure at the time of the procedure: Secondary | ICD-10-CM | POA: Diagnosis present

## 2023-08-21 DIAGNOSIS — K6389 Other specified diseases of intestine: Secondary | ICD-10-CM | POA: Diagnosis not present

## 2023-08-21 DIAGNOSIS — A0472 Enterocolitis due to Clostridium difficile, not specified as recurrent: Secondary | ICD-10-CM | POA: Diagnosis present

## 2023-08-21 DIAGNOSIS — K2970 Gastritis, unspecified, without bleeding: Secondary | ICD-10-CM | POA: Diagnosis present

## 2023-08-21 DIAGNOSIS — K7460 Unspecified cirrhosis of liver: Secondary | ICD-10-CM | POA: Diagnosis present

## 2023-08-21 DIAGNOSIS — Z87891 Personal history of nicotine dependence: Secondary | ICD-10-CM | POA: Diagnosis not present

## 2023-08-21 DIAGNOSIS — Z79620 Long term (current) use of immunosuppressive biologic: Secondary | ICD-10-CM | POA: Diagnosis not present

## 2023-08-21 LAB — COMPREHENSIVE METABOLIC PANEL WITH GFR
ALT: 67 U/L — ABNORMAL HIGH (ref 0–44)
AST: 49 U/L — ABNORMAL HIGH (ref 15–41)
Albumin: 2.7 g/dL — ABNORMAL LOW (ref 3.5–5.0)
Alkaline Phosphatase: 128 U/L — ABNORMAL HIGH (ref 38–126)
Anion gap: 9 (ref 5–15)
BUN: 19 mg/dL (ref 6–20)
CO2: 26 mmol/L (ref 22–32)
Calcium: 8.4 mg/dL — ABNORMAL LOW (ref 8.9–10.3)
Chloride: 103 mmol/L (ref 98–111)
Creatinine, Ser: 1.15 mg/dL (ref 0.61–1.24)
GFR, Estimated: >60 mL/min (ref >60)
Glucose, Bld: 96 mg/dL (ref 70–99)
Potassium: 3.3 mmol/L — ABNORMAL LOW (ref 3.5–5.1)
Sodium: 138 mmol/L (ref 135–145)
Total Bilirubin: 0.8 mg/dL (ref 0.0–1.2)
Total Protein: 5.1 g/dL — ABNORMAL LOW (ref 6.5–8.1)

## 2023-08-21 LAB — CBC
HCT: 40.1 % (ref 39.0–52.0)
Hemoglobin: 13.7 g/dL (ref 13.0–17.0)
MCH: 31.5 pg (ref 26.0–34.0)
MCHC: 34.2 g/dL (ref 30.0–36.0)
MCV: 92.2 fL (ref 80.0–100.0)
Platelets: 123 10*3/uL — ABNORMAL LOW (ref 150–400)
RBC: 4.35 MIL/uL (ref 4.22–5.81)
RDW: 14.7 % (ref 11.5–15.5)
WBC: 13.8 10*3/uL — ABNORMAL HIGH (ref 4.0–10.5)
nRBC: 0 % (ref 0.0–0.2)

## 2023-08-21 MED ORDER — NA SULFATE-K SULFATE-MG SULF 17.5-3.13-1.6 GM/177ML PO SOLN
0.5000 | Freq: Once | ORAL | Status: AC
  Administered 2023-08-21: 177 mL via ORAL
  Filled 2023-08-21: qty 1

## 2023-08-21 MED ORDER — ENSURE PLUS HIGH PROTEIN PO LIQD
237.0000 mL | Freq: Two times a day (BID) | ORAL | Status: DC
  Administered 2023-08-23: 237 mL via ORAL

## 2023-08-21 MED ORDER — POTASSIUM CHLORIDE CRYS ER 20 MEQ PO TBCR
40.0000 meq | EXTENDED_RELEASE_TABLET | Freq: Once | ORAL | Status: AC
  Administered 2023-08-21: 40 meq via ORAL
  Filled 2023-08-21: qty 2

## 2023-08-21 MED ORDER — NA SULFATE-K SULFATE-MG SULF 17.5-3.13-1.6 GM/177ML PO SOLN
0.5000 | Freq: Once | ORAL | Status: AC
  Administered 2023-08-22: 177 mL via ORAL

## 2023-08-21 MED ORDER — LACTATED RINGERS IV SOLN: INTRAVENOUS | Status: AC

## 2023-08-21 NOTE — Anesthesia Preprocedure Evaluation (Signed)
 Anesthesia Evaluation  Patient identified by MRN, date of birth, ID band Patient awake    Reviewed: Allergy & Precautions, NPO status , Patient's Chart, lab work & pertinent test results  History of Anesthesia Complications (+) Family history of anesthesia reaction  Airway Mallampati: I  TM Distance: >3 FB Neck ROM: Full    Dental  (+) Dental Advisory Given   Pulmonary neg shortness of breath, neg sleep apnea, neg COPD, neg recent URI, former smoker   breath sounds clear to auscultation       Cardiovascular hypertension, (-) angina (-) Past MI, (-) Cardiac Stents and (-) CABG (-) dysrhythmias  Rhythm:Regular Rate:Normal  HLD  TTE 07/03/2023: IMPRESSIONS    1. Left ventricular ejection fraction, by estimation, is 70 to 75%. The  left ventricle has hyperdynamic function. The left ventricle has no  regional wall motion abnormalities. There is mild asymmetric left  ventricular hypertrophy of the basal-septal  segment. Left ventricular diastolic parameters are consistent with Grade I  diastolic dysfunction (impaired relaxation).   2. Right ventricular systolic function is normal. The right ventricular  size is normal. Tricuspid regurgitation signal is inadequate for assessing  PA pressure.   3. The mitral valve is normal in structure. No evidence of mitral valve  regurgitation. No evidence of mitral stenosis.   4. The aortic valve was not well visualized. There is mild calcification  of the aortic valve. There is mild thickening of the aortic valve. Aortic  valve regurgitation is not visualized. No aortic stenosis is present.   5. The inferior vena cava is normal in size with greater than 50%  respiratory variability, suggesting right atrial pressure of 3 mmHg.     Neuro/Psych neg Seizures PSYCHIATRIC DISORDERS (h/o suicide attempt, PTSD) Anxiety Depression    Chronic pain syndrome    GI/Hepatic ,neg GERD  ,,(+) Cirrhosis      substance abuse (h/o alcohol use in remission)  H/o Autoimmune hepatitis, h/o portal vein thrombosis  Crohn's disease, primary sclerosing cholangitis   Endo/Other  diabetes, Type 2    Renal/GU negative Renal ROS     Musculoskeletal  (+) Arthritis ,  Osteopenia    Abdominal   Peds  Hematology  (+) Blood dyscrasia (thrombocytopenia), anemia Patient reports he will accept blood products if needed.  Lab Results      Component                Value               Date                      WBC                      13.8 (H)            08/21/2023                HGB                      13.7                08/21/2023                HCT                      40.1                08/21/2023  MCV                      92.2                08/21/2023                PLT                      123 (L)             08/21/2023              Anesthesia Other Findings Metastatic adenocarcinoma  Reproductive/Obstetrics                             Anesthesia Physical Anesthesia Plan  ASA: 4  Anesthesia Plan: MAC   Post-op Pain Management: Minimal or no pain anticipated   Induction: Intravenous  PONV Risk Score and Plan: 1 and Propofol  infusion, TIVA and Treatment may vary due to age or medical condition  Airway Management Planned: Natural Airway and Nasal Cannula  Additional Equipment:   Intra-op Plan:   Post-operative Plan:   Informed Consent: I have reviewed the patients History and Physical, chart, labs and discussed the procedure including the risks, benefits and alternatives for the proposed anesthesia with the patient or authorized representative who has indicated his/her understanding and acceptance.     Dental advisory given  Plan Discussed with: CRNA and Anesthesiologist  Anesthesia Plan Comments: (Discussed with patient risks of MAC including, but not limited to, minor pain or discomfort, hearing people in the room, and possible need for  backup general anesthesia. Risks for general anesthesia also discussed including, but not limited to, sore throat, hoarse voice, chipped/damaged teeth, injury to vocal cords, nausea and vomiting, allergic reactions, lung infection, heart attack, stroke, and death. All questions answered. )       Anesthesia Quick Evaluation

## 2023-08-21 NOTE — Plan of Care (Signed)
 Pt continues to progress through plan of care. His affect improved throughout the shift. Will continue to monitor.

## 2023-08-21 NOTE — H&P (View-Only) (Signed)
   Patient Name: [name] Date of Encounter: 08/21/2023, 9:07 AM     Assessment and Plan  Lower GI bleed after colonoscopy and polypectomy- post polypectomy bleed   Recent diagnosis of adenocarcinoma in the liver, exact etiology not clear.  Recent EGD and colonoscopy did not reveal cause.  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  Outpatient oncology appointment pending for next week.  My suspicion is this is likely a biliary primary, probably a cholangiocarcinoma.   Cirrhosis of the liver, autoimmune hepatitis/primary sclerosing cholangitis overlap   Crohn's disease of the small and large intestine with history of fistula and perianal abscesses.  On Entyvio    Chronic steroid therapy for autoimmune diseases of liver and bowel   C. difficile colitis, on vancomycin  taper has been asymptomatic and no evidence of C. difficile on recent colonoscopy  ------------------------------------------------------------------------------------------------------------------------------  Hemoglobin stable and still normal but the patient has had multiple bloody stools with bright red and maroon stools plus clots.  I will prep him for a colonoscopy tomorrow.  Since he has had persistent bleeding I think he needs evaluation and endoscopic treatment of bleeding site(s)  Continue clear liquid diet for now.  Start IV fluids later today.  I have ordered.  Continue current medication regimen to treat his other problems as outlined above.    Subjective  No abdominal pain.  He is continuing to pass bloody stools.  He says 7 or 8 since admission.   Objective  BP (!) 130/98 (BP Location: Right Arm)   Pulse 83   Temp 98 F (36.7 C) (Oral)   Resp 16   Ht 6' 2 (1.88 m)   Wt 68.2 kg   SpO2 100%   BMI 19.30 kg/m  Patient is in no acute distress mildly chronically ill Lungs clear Heart S1-S2 no rubs murmurs or  gallops Abdomen is scaphoid soft nontender I detect no hepatosplenomegaly or mass bowel sounds present Stool in commode is bright red and with maroon and darker clots.  No melena.  Recent Labs  Lab 08/20/23 1015 08/20/23 1713 08/21/23 0544  HGB 13.8 14.7 13.7  HCT 41.9 44.1 40.1  WBC 15.3* 15.6* 13.8*  PLT 161 164 123*   Recent Labs  Lab 08/15/23 1223 08/20/23 1015  NA 140 138  K 4.0 3.8  CL 103 103  CO2 29 30  GLUCOSE 145* 108*  BUN 24* 24*  CREATININE 1.16 0.97  CALCIUM 9.0 8.4*   Recent Labs  Lab 08/15/23 1223 08/20/23 1713  AST 43*  --   ALT 80*  --   ALKPHOS 158*  --   BILITOT 0.4  --   PROT 5.6*  --   ALBUMIN 3.6  --   INR  --  1.0      Lupita CHARLENA Commander, MD, [facility] See AMION on call - gastroenterology for best contact person 08/21/2023 9:07 AM

## 2023-08-21 NOTE — Plan of Care (Signed)

## 2023-08-21 NOTE — Discharge Summary (Incomplete)
 Physician Discharge Summary   Patient: [name] MRN: [id number] DOB: Sep 17, 1965  Admit date:     08/20/2023  Discharge date: {dischdate:26783}  Discharge Physician: [name]   PCP: [name] Carlin Redbird, MD   Recommendations at discharge:  {Tip this will not be part of the note when signed- Example include specific recommendations for outpatient follow-up, pending tests to follow-up on. (Optional):26781}  ***  Discharge Diagnoses: Principal Problem:   GI bleed Active Problems:   Bloody diarrhea  Resolved Problems:   * No resolved hospital problems. [facility] Course: No notes on file  Assessment and Plan: No notes have been filed under this hospital service. Service: Hospitalist     {Tip this will not be part of the note when signed Body mass index is 19.3 kg/m. , ,  (Optional):26781}  {(NOTE) Pain control PDMP Statment (Optional):26782} Consultants: *** Procedures performed: ***  Disposition: {Plan; Disposition:26390} Diet recommendation:  {Diet_Plan:26776} DISCHARGE MEDICATION: Allergies as of 08/21/2023       Reactions   Tape Itching, Rash, Other (See Comments)   Please use paper tape   Asacol  [mesalamine ] Diarrhea, Other (See Comments)   Abd pain, too   Azathioprine Diarrhea, Other (See Comments)   Abd pain, too   Gluten Meal Diarrhea   Metoprolol  Tartrate Nausea Only, Other (See Comments)   Stomach upset   Mycophenolate Mofetil Diarrhea, Other (See Comments)   Abd pain, too   Other Diarrhea, Other (See Comments)   NUTS   Wheat Diarrhea, Other (See Comments)   Constipation; flatulence; abd pain (can tolerate sometimes)   Abilify  [aripiprazole ] Diarrhea   Amoxicillin  Other (See Comments)   Lots of gas     Med Rec must be completed prior to using this [facility]***       Follow-up Information     Call  [name] Carlin Redbird, MD.   Specialty: [facility] Contact information: [address] [place]  [address] [phone]                Discharge Exam: [name]   08/20/23 1456  Weight: 68.2 kg   ***  Condition at discharge: {DC Condition:26389}  The results of significant diagnostics from this hospitalization (including imaging, microbiology, ancillary and laboratory) are listed below for reference.   Imaging Studies: CT ABDOMEN PELVIS W CONTRAST Result Date: 08/20/2023 CLINICAL DATA:  New onset bloody stool. History of Crohn's disease. Recent ultrasound-guided liver biopsy. Well differentiated adenocarcinoma on liver biopsy. * Tracking Code: BO *. EXAM: CT ABDOMEN AND PELVIS WITH CONTRAST TECHNIQUE: Multidetector CT imaging of the abdomen and pelvis was performed using the standard protocol following bolus administration of intravenous contrast. RADIATION DOSE REDUCTION: This exam was performed according to the departmental dose-optimization program which includes automated exposure control, adjustment of the mA and/or kV according to patient size and/or use of iterative reconstruction technique. CONTRAST:  OMNIPAQUE  IOHEXOL  300 MG/ML  SOLN COMPARISON:  CT 07/02/2023, MRI 07/03/2023 FINDINGS: Lower chest: No pulmonary nodules the lung bases. Hepatobiliary: Lesion lateral margin of the RIGHT hepatic lobe is increased in the interval from prior CT measuring 4.6 x 3.6 cm compared to 3.7 x 2.8 cm. A small cystic collection adjacent to the expanding mass is not changed measuring 1.3 cm compared to 1.3 cm. Additionally cystic lesion inferior to the hepatic mass measures 4. 5 cm compared to 4.7 cm for no change. Gallbladder unchanged. Several small cystic lesions in the LEFT hepatic lobe unchanged. Small low-density lesions centrally in liver measuring  7 mm on image 13/2 is indeterminate. Similar lesion in the LEFT hepatic lobe more lateral measuring 6 mm image 18 indeterminate. These lesions are not well appreciated on comparison MRI. Comparison CT with noncontrast. Pancreas: Pancreas  is normal. No ductal dilatation. No pancreatic inflammation. Spleen: Normal spleen Adrenals/urinary tract: Adrenal glands and kidneys are normal. The ureters and bladder normal. Stomach/Bowel: Small diverticulum posterior to the gastric fundus. Stomach, duodenum and small-bowel normal. Ascending transverse and descending colon appear normal. There is fluid in the rectum. Vascular/Lymphatic: Abdominal aorta is normal caliber with atherosclerotic calcification. There is no retroperitoneal or periportal lymphadenopathy. No pelvic lymphadenopathy. Reproductive: Prostate unremarkable Other: No free fluid. Musculoskeletal: No aggressive osseous lesion. IMPRESSION: 1. Enlarging mass in the RIGHT hepatic lobe consistent with biopsy-proven adenocarcinoma. 2. Two small hypodense lesions in LEFT hepatic lobe are indeterminate 3. No evidence of bowel inflammation. No stenosis or fistula. Fluid in rectum. Electronically Signed   By: [name] M.D.   On: 08/20/2023 11:53   US  BIOPSY (LIVER) Result Date: 07/29/2023 CLINICAL DATA:  Lesion in question on CT scan yesterday measures approximately 3.7 x 2.8 x 4.3 cm in the lateral aspect of the hepatic dome. Lesion shows heterogeneous enhancement after IV contrast administration, suspicious for neoplasm. Given the history of primary sclerosing cholangitis, cholangiocarcinoma would be a distinct concern. Tissue sampling recommended. EXAM: ULTRASOUND-GUIDED CORE LIVER BIOPSY TECHNIQUE: An ultrasound guided liver biopsy was thoroughly discussed with the patient and questions were answered. The benefits, risks, alternatives, and complications were also discussed. The patient understands and wishes to proceed with the procedure. A verbal as well as written consent was obtained. Survey ultrasound of the liver was performed, right lobe lesion localized, and an appropriate skin entry site was determined. Skin site was marked, prepped with chlorhexidine , and draped in usual sterile  fashion, and infiltrated locally with 1% lidocaine . Intravenous Fentanyl  100mcg and Versed  2mg  were administered by RN during a total moderate (conscious) sedation time of 10 minutes; the patient's level of consciousness and physiological / cardiorespiratory status were monitored continuously by radiology RN under my direct supervision. A 17 gauge trocar needle was advanced under ultrasound guidance into the liver to the margin of the lesion. Multiple solid-appearing coaxial 18gauge core samples were then obtained through the guide needle. The guide needle was removed. Post procedure scans demonstrate no hemorrhage or other apparent complication. COMPLICATIONS: COMPLICATIONS None immediate FINDINGS: Complex right lobe liver lesion measuring 3.8 cm is identified corresponding to CT findings. Representative core biopsy samples obtained as above. IMPRESSION: 1. Technically successful ultrasound guided core liver lesion biopsy. Electronically Signed   By: JONETTA Faes M.D.   On: 07/29/2023 16:26   DG Knee Complete 4 Views Right Result Date: 07/26/2023 CLINICAL DATA:  Pain after fall. EXAM: RIGHT KNEE - COMPLETE 4+ VIEW COMPARISON:  None Available. FINDINGS: No evidence of acute fracture, dislocation, or joint effusion. No evidence of significant arthropathy. Vascular calcifications are noted. No significant focal soft tissue swelling. IMPRESSION: No acute osseous abnormality. Electronically Signed   By: Harrietta Sherry M.D.   On: 07/26/2023 14:15    Microbiology: Results for orders placed or performed during the hospital encounter of 07/02/23  Culture, blood (Routine X 2) w Reflex to ID Panel     Status: None   Collection Time: 07/02/23  6:24 PM   Specimen: BLOOD RIGHT ARM  Result Value Ref Range Status   Specimen Description BLOOD RIGHT ARM  Final   Special Requests   Final  Blood Culture results may not be optimal due to an inadequate volume of blood received in culture bottles AEROBIC BOTTLE ONLY    Culture   Final    NO GROWTH 5 DAYS Performed at [facility], [address], [place], [place] [address]    Report Status 07/07/2023 FINAL  Final  Culture, blood (Routine X 2) w Reflex to ID Panel     Status: None   Collection Time: 07/02/23  6:24 PM   Specimen: BLOOD RIGHT ARM  Result Value Ref Range Status   Specimen Description BLOOD RIGHT ARM  Final   Special Requests   Final    Blood Culture results may not be optimal due to an inadequate volume of blood received in culture bottles AEROBIC BOTTLE ONLY   Culture   Final    NO GROWTH 5 DAYS Performed at [facility], [address], [place], [place] [address]    Report Status 07/07/2023 FINAL  Final  Gastrointestinal Panel by PCR , Stool     Status: Abnormal   Collection Time: 07/02/23  7:47 PM   Specimen: STOOL  Result Value Ref Range Status   Campylobacter species NOT DETECTED NOT DETECTED Final   Plesimonas shigelloides NOT DETECTED NOT DETECTED Final   Salmonella species NOT DETECTED NOT DETECTED Final   Yersinia enterocolitica NOT DETECTED NOT DETECTED Final   Vibrio species NOT DETECTED NOT DETECTED Final   Vibrio cholerae NOT DETECTED NOT DETECTED Final   Enteroaggregative E coli (EAEC) NOT DETECTED NOT DETECTED Final   Enteropathogenic E coli (EPEC) NOT DETECTED NOT DETECTED Final   Enterotoxigenic E coli (ETEC) NOT DETECTED NOT DETECTED Final   Shiga like toxin producing E coli (STEC) NOT DETECTED NOT DETECTED Final   Shigella/Enteroinvasive E coli (EIEC) NOT DETECTED NOT DETECTED Final   Cryptosporidium NOT DETECTED NOT DETECTED Final   Cyclospora cayetanensis NOT DETECTED NOT DETECTED Final   Entamoeba histolytica NOT DETECTED NOT DETECTED Final   Giardia lamblia NOT DETECTED NOT DETECTED Final   Adenovirus F40/41 NOT DETECTED NOT DETECTED Final   Astrovirus NOT DETECTED NOT DETECTED Final   Norovirus GI/GII DETECTED (A) NOT DETECTED Final    Comment: CRITICAL RESULT CALLED TO, READ BACK BY AND  VERIFIED WITH: CHARLENA MOTTO RN @ [phone] 07/03/23 [facility]    Rotavirus A NOT DETECTED NOT DETECTED Final   Sapovirus (I, II, IV, and V) NOT DETECTED NOT DETECTED Final    Comment: Performed at [facility], [address] [place], [place] [address]  C Difficile Quick Screen w PCR reflex     Status: Abnormal   Collection Time: 07/02/23  7:47 PM   Specimen: STOOL  Result Value Ref Range Status   C Diff antigen POSITIVE (A) NEGATIVE Final   C Diff toxin NEGATIVE NEGATIVE Final   C Diff interpretation Results are indeterminate. See PCR results.  Final    Comment: Performed at [facility], [address] [place], [place] [address]  C. Diff by PCR, Reflexed     Status: Abnormal   Collection Time: 07/02/23  7:47 PM  Result Value Ref Range Status   Toxigenic C. Difficile by PCR POSITIVE (A) NEGATIVE Final    Comment: Positive for toxigenic C. difficile with little to no toxin production. Only treat if clinical presentation suggests symptomatic illness. Performed at [facility], [address] [place], [place] [address]    *Note: Due to a large number of results and/or encounters for  the requested time period, some results have not been displayed. A complete set of results can be found in Results Review.    Labs: CBC: Recent Labs  Lab 08/15/23 1223 08/20/23 1015 08/20/23 1713 08/21/23 0544  WBC 12.7* 15.3* 15.6* 13.8*  NEUTROABS 12.0* 13.1*  --   --   HGB 15.6 13.8 14.7 13.7  HCT 45.8 41.9 44.1 40.1  MCV 90.8 93.5 94.4 92.2  PLT 197.0 161 164 123*   Basic Metabolic Panel: Recent Labs  Lab 08/15/23 1223 08/20/23 1015  NA 140 138  K 4.0 3.8  CL 103 103  CO2 29 30  GLUCOSE 145* 108*  BUN 24* 24*  CREATININE 1.16 0.97  CALCIUM 9.0 8.4*   Liver Function Tests: Recent Labs  Lab 08/15/23 1223  AST 43*  ALT 80*  ALKPHOS 158*  BILITOT 0.4  PROT 5.6*  ALBUMIN 3.6   CBG: No results for input(s): GLUCAP in the last 168 hours.  Discharge time  spent: {LESS THAN/GREATER UYJW:73611} 30 minutes.  Signed: Elgin Lam, MD [facility] 08/21/2023

## 2023-08-21 NOTE — Progress Notes (Signed)
   Patient Name: [name] Date of Encounter: 08/21/2023, 9:07 AM     Assessment and Plan  Lower GI bleed after colonoscopy and polypectomy- post polypectomy bleed   Recent diagnosis of adenocarcinoma in the liver, exact etiology not clear.  Recent EGD and colonoscopy did not reveal cause.  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  Outpatient oncology appointment pending for next week.  My suspicion is this is likely a biliary primary, probably a cholangiocarcinoma.   Cirrhosis of the liver, autoimmune hepatitis/primary sclerosing cholangitis overlap   Crohn's disease of the small and large intestine with history of fistula and perianal abscesses.  On Entyvio    Chronic steroid therapy for autoimmune diseases of liver and bowel   C. difficile colitis, on vancomycin  taper has been asymptomatic and no evidence of C. difficile on recent colonoscopy  ------------------------------------------------------------------------------------------------------------------------------  Hemoglobin stable and still normal but the patient has had multiple bloody stools with bright red and maroon stools plus clots.  I will prep him for a colonoscopy tomorrow.  Since he has had persistent bleeding I think he needs evaluation and endoscopic treatment of bleeding site(s)  Continue clear liquid diet for now.  Start IV fluids later today.  I have ordered.  Continue current medication regimen to treat his other problems as outlined above.    Subjective  No abdominal pain.  He is continuing to pass bloody stools.  He says 7 or 8 since admission.   Objective  BP (!) 130/98 (BP Location: Right Arm)   Pulse 83   Temp 98 F (36.7 C) (Oral)   Resp 16   Ht 6' 2 (1.88 m)   Wt 68.2 kg   SpO2 100%   BMI 19.30 kg/m  Patient is in no acute distress mildly chronically ill Lungs clear Heart S1-S2 no rubs murmurs or  gallops Abdomen is scaphoid soft nontender I detect no hepatosplenomegaly or mass bowel sounds present Stool in commode is bright red and with maroon and darker clots.  No melena.  Recent Labs  Lab 08/20/23 1015 08/20/23 1713 08/21/23 0544  HGB 13.8 14.7 13.7  HCT 41.9 44.1 40.1  WBC 15.3* 15.6* 13.8*  PLT 161 164 123*   Recent Labs  Lab 08/15/23 1223 08/20/23 1015  NA 140 138  K 4.0 3.8  CL 103 103  CO2 29 30  GLUCOSE 145* 108*  BUN 24* 24*  CREATININE 1.16 0.97  CALCIUM 9.0 8.4*   Recent Labs  Lab 08/15/23 1223 08/20/23 1713  AST 43*  --   ALT 80*  --   ALKPHOS 158*  --   BILITOT 0.4  --   PROT 5.6*  --   ALBUMIN 3.6  --   INR  --  1.0      Lupita CHARLENA Commander, MD, [facility] See AMION on call - gastroenterology for best contact person 08/21/2023 9:07 AM

## 2023-08-21 NOTE — Hospital Course (Signed)
 [name] is a 58 y.o. male with a history of cirrhosis of liver, PSC/autoimmune hepatitis, Crohn's disease, anxiety, depression, PTSD, osteoarthritis, adenocarcinoma of the liver.  Patient presented secondary to bloody diarrhea in setting of recent colonoscopy with polypectomy.  GI consulted.  Plan for colonoscopy.

## 2023-08-21 NOTE — Progress Notes (Signed)
 PROGRESS NOTE    RAWLINS STUARD  FMW:[id number] DOB: 1965-05-27 DOA: 08/20/2023 PCP: [name], MD   Brief Narrative: [name] is a 58 y.o. male with a history of cirrhosis of liver, PSC/autoimmune hepatitis, Crohn's disease, anxiety, depression, PTSD, osteoarthritis, adenocarcinoma of the liver.  Patient presented secondary to bloody diarrhea in setting of recent colonoscopy with polypectomy.  GI consulted.  Plan for colonoscopy.   Assessment and Plan:  Hematochezia In setting of recent colonoscopy with polypectomies.  Gastroenterology consult on admission with initial recommendations for observation.  Patient with continued bloody stools noted today.  Hemoglobin is stable. -GI recommendations: Plan for colonoscopy in a.m., clear liquid diet -CBC in AM  Hypokalemia Mild. -Potassium supplementation  Adenocarcinoma of the liver Noted. Concern for biliary tract origin vs GI tract origin. Patient with recent colonoscopy and biopsy.  PSC Autoimmune hepatitis Cirrhosis of the liver Noted. Mildly elevated AST/ALT.  History of Crohn's colitis Noted. -Continue prednisone   History of C. Difficile colitis Patient is on a Vancomycin  taper. -Continue Vancomycin   Anxiety Depression PTSD -Continue venlafaxine , mirtazapine    DVT prophylaxis: SCDs Code Status:   Code Status: Full Code Family Communication: None at bedside Disposition Plan: Discharge likely in 1-2 days pending ongoing GI recommendations   Consultants:  Gastroenterology  Procedures:  None  Antimicrobials: None    Subjective: Continued bloody stool today.  Objective: BP (!) 130/98 (BP Location: Right Arm)   Pulse 83   Temp 98 F (36.7 C) (Oral)   Resp 16   Ht 6' 2 (1.88 m)   Wt 68.2 kg   SpO2 100%   BMI 19.30 kg/m   Examination:  General exam: Appears calm and comfortable Respiratory system: Clear to auscultation. Respiratory effort normal. Cardiovascular system:  S1 & S2 heard, RRR. No murmurs, rubs, gallops or clicks. Gastrointestinal system: Abdomen is nondistended, soft and nontender. No organomegaly or masses felt. Normal bowel sounds heard. Central nervous system: Alert and oriented. No focal neurological deficits. Musculoskeletal: No edema. No calf tenderness Psychiatry: Judgement and insight appear normal. Mood & affect appropriate.    Data Reviewed: I have personally reviewed following labs and imaging studies  CBC Lab Results  Component Value Date   WBC 13.8 (H) 08/21/2023   RBC 4.35 08/21/2023   HGB 13.7 08/21/2023   HCT 40.1 08/21/2023   MCV 92.2 08/21/2023   MCH 31.5 08/21/2023   PLT 123 (L) 08/21/2023   MCHC 34.2 08/21/2023   RDW 14.7 08/21/2023   LYMPHSABS 0.4 (L) 08/20/2023   MONOABS 1.1 (H) 08/20/2023   EOSABS 0.1 08/20/2023   BASOSABS 0.1 08/20/2023     Last metabolic panel Lab Results  Component Value Date   NA 138 08/20/2023   K 3.8 08/20/2023   CL 103 08/20/2023   CO2 30 08/20/2023   BUN 24 (H) 08/20/2023   CREATININE 0.97 08/20/2023   GLUCOSE 108 (H) 08/20/2023   GFRNONAA >60 08/20/2023   GFRAA >60 01/18/2019   CALCIUM 8.4 (L) 08/20/2023   PHOS 1.9 (L) 07/03/2023   PROT 5.6 (L) 08/15/2023   ALBUMIN 3.6 08/15/2023   BILITOT 0.4 08/15/2023   ALKPHOS 158 (H) 08/15/2023   AST 43 (H) 08/15/2023   ALT 80 (H) 08/15/2023   ANIONGAP 5 08/20/2023    GFR: Estimated Creatinine Clearance: 78.1 mL/min (by C-G formula based on SCr of 0.97 mg/dL).  No results found for this or any previous visit (from the past 240 hours).    Radiology Studies: CT ABDOMEN  PELVIS W CONTRAST Result Date: 08/20/2023 CLINICAL DATA:  New onset bloody stool. History of Crohn's disease. Recent ultrasound-guided liver biopsy. Well differentiated adenocarcinoma on liver biopsy. * Tracking Code: BO *. EXAM: CT ABDOMEN AND PELVIS WITH CONTRAST TECHNIQUE: Multidetector CT imaging of the abdomen and pelvis was performed using the standard  protocol following bolus administration of intravenous contrast. RADIATION DOSE REDUCTION: This exam was performed according to the departmental dose-optimization program which includes automated exposure control, adjustment of the mA and/or kV according to patient size and/or use of iterative reconstruction technique. CONTRAST:  OMNIPAQUE  IOHEXOL  300 MG/ML  SOLN COMPARISON:  CT 07/02/2023, MRI 07/03/2023 FINDINGS: Lower chest: No pulmonary nodules the lung bases. Hepatobiliary: Lesion lateral margin of the RIGHT hepatic lobe is increased in the interval from prior CT measuring 4.6 x 3.6 cm compared to 3.7 x 2.8 cm. A small cystic collection adjacent to the expanding mass is not changed measuring 1.3 cm compared to 1.3 cm. Additionally cystic lesion inferior to the hepatic mass measures 4. 5 cm compared to 4.7 cm for no change. Gallbladder unchanged. Several small cystic lesions in the LEFT hepatic lobe unchanged. Small low-density lesions centrally in liver measuring 7 mm on image 13/2 is indeterminate. Similar lesion in the LEFT hepatic lobe more lateral measuring 6 mm image 18 indeterminate. These lesions are not well appreciated on comparison MRI. Comparison CT with noncontrast. Pancreas: Pancreas is normal. No ductal dilatation. No pancreatic inflammation. Spleen: Normal spleen Adrenals/urinary tract: Adrenal glands and kidneys are normal. The ureters and bladder normal. Stomach/Bowel: Small diverticulum posterior to the gastric fundus. Stomach, duodenum and small-bowel normal. Ascending transverse and descending colon appear normal. There is fluid in the rectum. Vascular/Lymphatic: Abdominal aorta is normal caliber with atherosclerotic calcification. There is no retroperitoneal or periportal lymphadenopathy. No pelvic lymphadenopathy. Reproductive: Prostate unremarkable Other: No free fluid. Musculoskeletal: No aggressive osseous lesion. IMPRESSION: 1. Enlarging mass in the RIGHT hepatic lobe consistent  with biopsy-proven adenocarcinoma. 2. Two small hypodense lesions in LEFT hepatic lobe are indeterminate 3. No evidence of bowel inflammation. No stenosis or fistula. Fluid in rectum. Electronically Signed   By: Jackquline Boxer M.D.   On: 08/20/2023 11:53      LOS: 0 days    Elgin Lam, MD [facility] 08/21/2023, 7:18 AM   If 7PM-7AM, please contact night-coverage [email or web address]

## 2023-08-22 ENCOUNTER — Encounter (HOSPITAL_COMMUNITY)
Admission: EM | Disposition: A | Discharge status: Home / Self Care | Payer: Self-pay | Source: Home / Self Care | Attending: Family Medicine

## 2023-08-22 ENCOUNTER — Inpatient Hospital Stay (HOSPITAL_COMMUNITY): Payer: Self-pay | Admitting: Anesthesiology

## 2023-08-22 ENCOUNTER — Encounter (HOSPITAL_COMMUNITY): Payer: Self-pay | Admitting: Student

## 2023-08-22 DIAGNOSIS — K6389 Other specified diseases of intestine: Secondary | ICD-10-CM | POA: Diagnosis not present

## 2023-08-22 DIAGNOSIS — K648 Other hemorrhoids: Secondary | ICD-10-CM

## 2023-08-22 DIAGNOSIS — K5730 Diverticulosis of large intestine without perforation or abscess without bleeding: Secondary | ICD-10-CM | POA: Diagnosis not present

## 2023-08-22 DIAGNOSIS — D125 Benign neoplasm of sigmoid colon: Secondary | ICD-10-CM | POA: Diagnosis not present

## 2023-08-22 DIAGNOSIS — K922 Gastrointestinal hemorrhage, unspecified: Secondary | ICD-10-CM

## 2023-08-22 DIAGNOSIS — T184XXA Foreign body in colon, initial encounter: Secondary | ICD-10-CM

## 2023-08-22 DIAGNOSIS — E785 Hyperlipidemia, unspecified: Secondary | ICD-10-CM | POA: Diagnosis not present

## 2023-08-22 DIAGNOSIS — K5140 Inflammatory polyps of colon without complications: Secondary | ICD-10-CM

## 2023-08-22 DIAGNOSIS — K920 Hematemesis: Secondary | ICD-10-CM

## 2023-08-22 DIAGNOSIS — I10 Essential (primary) hypertension: Secondary | ICD-10-CM

## 2023-08-22 HISTORY — PX: COLONOSCOPY: SHX5424

## 2023-08-22 LAB — CBC
HCT: 34.9 % — ABNORMAL LOW (ref 39.0–52.0)
Hemoglobin: 11.6 g/dL — ABNORMAL LOW (ref 13.0–17.0)
MCH: 31.4 pg (ref 26.0–34.0)
MCHC: 33.2 g/dL (ref 30.0–36.0)
MCV: 94.6 fL (ref 80.0–100.0)
Platelets: 157 10*3/uL (ref 150–400)
RBC: 3.69 MIL/uL — ABNORMAL LOW (ref 4.22–5.81)
RDW: 14.9 % (ref 11.5–15.5)
WBC: 8.4 10*3/uL (ref 4.0–10.5)
nRBC: 0 % (ref 0.0–0.2)

## 2023-08-22 SURGERY — COLONOSCOPY
Anesthesia: Monitor Anesthesia Care

## 2023-08-22 MED ORDER — SODIUM CHLORIDE 0.9 % IV SOLN
INTRAVENOUS | Status: DC
Start: 2023-08-22 — End: 2023-08-22

## 2023-08-22 MED ORDER — PROPOFOL 500 MG/50ML IV EMUL
INTRAVENOUS | Status: DC | PRN
  Administered 2023-08-22: 50 mg via INTRAVENOUS
  Administered 2023-08-22: 100 ug/kg/min via INTRAVENOUS

## 2023-08-22 NOTE — Op Note (Signed)
 [facility] Patient Name: [name] Procedure Date: 08/22/2023 MRN: [id number] Attending MD: Rosario Estefana Kidney , , [id number] Date of Birth: 03-10-1965 CSN: [id number] Age: 58 Admit Type: Inpatient Procedure:                Colonoscopy Indications:              Hematochezia Providers:                Rosario Estefana Kidney, Darleene Bare, RN, Curtistine Bishop, Technician Referring MD:             Hospitalist team Medicines:                Monitored Anesthesia Care Complications:            No immediate complications. Estimated Blood Loss:     Estimated blood loss was minimal. Procedure:                Pre-Anesthesia Assessment:                           - Prior to the procedure, a History and Physical                            was performed, and patient medications and                            allergies were reviewed. The patient's tolerance of                            previous anesthesia was also reviewed. The risks                            and benefits of the procedure and the sedation                            options and risks were discussed with the patient.                            All questions were answered, and informed consent                            was obtained. Prior Anticoagulants: The patient has                            taken no anticoagulant or antiplatelet agents. ASA                            Grade Assessment: III - A patient with severe                            systemic disease. After reviewing the risks and  benefits, the patient was deemed in satisfactory                            condition to undergo the procedure.                           After obtaining informed consent, the colonoscope                            was passed under direct vision. Throughout the                            procedure, the patient's blood pressure, pulse, and                             oxygen saturations were monitored continuously. The                            [id number]-HQ190L ([id number]) Olympus colonoscope was                            introduced through the anus and advanced to the the                            terminal ileum. The colonoscopy was performed                            without difficulty. The patient tolerated the                            procedure well. The quality of the bowel                            preparation was good. The terminal ileum, ileocecal                            valve, appendiceal orifice, and rectum were                            photographed. Scope In: 1:44:46 PM Scope Out: 2:11:13 PM Scope Withdrawal Time: 0 hours 17 minutes 43 seconds  Total Procedure Duration: 0 hours 26 minutes 27 seconds  Findings:      The terminal ileum appeared normal.      An unattached Endoclip was found in the cecum.      Multiple pseudopolyps were found in the sigmoid colon, descending colon,       transverse colon and ascending colon.      A tattoo was seen in the transverse colon.      Three post-polypectomy ulcers were found in the sigmoid colon and in the       transverse colon. No bleeding was present. Stigmata of recent bleeding       were present. To close a defect after polypectomy, three hemostatic       clips were successfully placed. There was no bleeding at the end of the  procedure.      Multiple diverticula were found in the sigmoid colon and descending       colon.      Non-bleeding internal hemorrhoids were found during retroflexion. Impression:               - The examined portion of the ileum was normal.                           - Foreign body in the cecum.                           - Multiple polyps in the sigmoid colon, in the                            descending colon, in the transverse colon and in                            the ascending colon.                           - A tattoo was seen in the transverse colon.                            - Three polypolypectomy ulcers in the sigmoid colon                            and in the transverse colon with stigmata of recent                            bleeding. Clips were placed.                           - Diverticulosis in the sigmoid colon and in the                            descending colon.                           - Non-bleeding internal hemorrhoids.                           - No specimens collected. Moderate Sedation:      Not Applicable - Patient had care per Anesthesia. Recommendation:           - Return patient to hospital ward for ongoing care.                           - Patient may have had some rectal bleeding due to                            post-polypectomy bleeding, which has now resolved.                            No active bleeding was visualized on today's exam.  Clips were placed to reduce his risk of further                            bleeding.                           - The findings and recommendations were discussed                            with the patient. Procedure Code(s):        --- Professional ---                           [phone], Colonoscopy, flexible; diagnostic, including                            collection of specimen(s) by brushing or washing,                            when performed (separate procedure) Diagnosis Code(s):        --- Professional ---                           K64.8, Other hemorrhoids                           T18.4XXA, Foreign body in colon, initial encounter                           D12.5, Benign neoplasm of sigmoid colon                           D12.4, Benign neoplasm of descending colon                           D12.3, Benign neoplasm of transverse colon (hepatic                            flexure or splenic flexure)                           D12.2, Benign neoplasm of ascending colon                           K63.3, Ulcer of intestine                            K92.1, Melena (includes Hematochezia)                           K57.30, Diverticulosis of large intestine without                            perforation or abscess without bleeding CPT copyright 2022 [employer]. All rights reserved. The codes documented in this report are preliminary and upon coder review may  be revised to meet current compliance requirements. Dr Estefana Kidney  Rosario Estefana Kidney,  08/22/2023 2:43:17 PM Number of Addenda: 0

## 2023-08-22 NOTE — Transfer of Care (Signed)
 Immediate Anesthesia Transfer of Care Note  Patient: [name]  Procedure(s) Performed: Procedure(s): COLONOSCOPY (N/A)  Patient Location: PACU and Endoscopy Unit  Anesthesia Type:MAC  Level of Consciousness: awake, alert  and oriented  Airway & Oxygen Therapy: Patient Spontanous Breathing and Patient connected to nasal cannula oxygen  Post-op Assessment: Report given to RN and Post -op Vital signs reviewed and stable  Post vital signs: Reviewed and stable  Last Vitals:  Vitals:   08/22/23 1306 08/22/23 1325  BP: (!) 184/112 (!) 161/92  Pulse: 92   Resp: 10   Temp: (!) 36.4 C   SpO2: 100%     Complications: No apparent anesthesia complications

## 2023-08-22 NOTE — Plan of Care (Signed)
  Problem: Education: Goal: Knowledge of General Education information will improve Description: Including pain rating scale, medication(s)/side effects and non-pharmacologic comfort measures Outcome: Progressing   Problem: Health Behavior/Discharge Planning: Goal: Ability to manage health-related needs will improve Outcome: Progressing   Problem: Pain Managment: Goal: General experience of comfort will improve and/or be controlled Outcome: Progressing

## 2023-08-22 NOTE — Progress Notes (Signed)
 PROGRESS NOTE    [name]  FMW:[id number] DOB: 31-Oct-1965 DOA: 08/20/2023 PCP: [name], MD   Brief Narrative: [name] is a 58 y.o. male with a history of cirrhosis of liver, PSC/autoimmune hepatitis, Crohn's disease, anxiety, depression, PTSD, osteoarthritis, adenocarcinoma of the liver.  Patient presented secondary to bloody diarrhea in setting of recent colonoscopy with polypectomy.  GI consulted.  Plan for colonoscopy.   Assessment and Plan:  Hematochezia In setting of recent colonoscopy with polypectomies.  Gastroenterology consult on admission with initial recommendations for observation.  Patient with continued bloody stools noted today.  Hemoglobin is trending downward.. -GI recommendations: Plan for colonoscopy -CBC in AM  Hypokalemia Mild. Patient given potassium supplementation.  Adenocarcinoma of the liver Noted. Concern for biliary tract origin vs GI tract origin. Patient with recent colonoscopy and biopsy.  PSC Autoimmune hepatitis Cirrhosis of the liver Noted. Mildly elevated AST/ALT.  History of Crohn's colitis Noted. -Continue prednisone   History of C. Difficile colitis Patient is on a Vancomycin  taper. -Continue Vancomycin   Anxiety Depression PTSD -Continue venlafaxine , mirtazapine    DVT prophylaxis: SCDs Code Status:   Code Status: Full Code Family Communication: None at bedside Disposition Plan: Discharge likely in 1-2 days pending ongoing GI recommendations   Consultants:  Gastroenterology  Procedures:  None  Antimicrobials: None    Subjective: Continued bloody stools  Objective: BP (!) 146/95 (BP Location: Left Arm)   Pulse 79   Temp 98 F (36.7 C) (Oral)   Resp 15   Ht 6' 2 (1.88 m)   Wt 68.2 kg   SpO2 100%   BMI 19.30 kg/m   Examination:  General exam: Appears calm and comfortable Respiratory system: Clear to auscultation. Respiratory effort normal. Cardiovascular system: S1 & S2  heard, RRR. No murmurs, rubs, gallops or clicks. Gastrointestinal system: Abdomen is nondistended, soft and nontender. Normal bowel sounds heard. Central nervous system: Alert and oriented. No focal neurological deficits. Musculoskeletal: No edema. No calf tenderness Psychiatry: Judgement and insight appear normal. Mood & affect appropriate.    Data Reviewed: I have personally reviewed following labs and imaging studies  CBC Lab Results  Component Value Date   WBC 8.4 08/22/2023   RBC 3.69 (L) 08/22/2023   HGB 11.6 (L) 08/22/2023   HCT 34.9 (L) 08/22/2023   MCV 94.6 08/22/2023   MCH 31.4 08/22/2023   PLT 157 08/22/2023   MCHC 33.2 08/22/2023   RDW 14.9 08/22/2023   LYMPHSABS 0.4 (L) 08/20/2023   MONOABS 1.1 (H) 08/20/2023   EOSABS 0.1 08/20/2023   BASOSABS 0.1 08/20/2023     Last metabolic panel Lab Results  Component Value Date   NA 138 08/21/2023   K 3.3 (L) 08/21/2023   CL 103 08/21/2023   CO2 26 08/21/2023   BUN 19 08/21/2023   CREATININE 1.15 08/21/2023   GLUCOSE 96 08/21/2023   GFRNONAA >60 08/21/2023   GFRAA >60 01/18/2019   CALCIUM 8.4 (L) 08/21/2023   PHOS 1.9 (L) 07/03/2023   PROT 5.1 (L) 08/21/2023   ALBUMIN 2.7 (L) 08/21/2023   BILITOT 0.8 08/21/2023   ALKPHOS 128 (H) 08/21/2023   AST 49 (H) 08/21/2023   ALT 67 (H) 08/21/2023   ANIONGAP 9 08/21/2023    GFR: Estimated Creatinine Clearance: 65.9 mL/min (by C-G formula based on SCr of 1.15 mg/dL).  No results found for this or any previous visit (from the past 240 hours).    Radiology Studies: CT ABDOMEN PELVIS W CONTRAST Result Date: 08/20/2023 CLINICAL  DATA:  New onset bloody stool. History of Crohn's disease. Recent ultrasound-guided liver biopsy. Well differentiated adenocarcinoma on liver biopsy. * Tracking Code: BO *. EXAM: CT ABDOMEN AND PELVIS WITH CONTRAST TECHNIQUE: Multidetector CT imaging of the abdomen and pelvis was performed using the standard protocol following bolus administration  of intravenous contrast. RADIATION DOSE REDUCTION: This exam was performed according to the departmental dose-optimization program which includes automated exposure control, adjustment of the mA and/or kV according to patient size and/or use of iterative reconstruction technique. CONTRAST:  OMNIPAQUE  IOHEXOL  300 MG/ML  SOLN COMPARISON:  CT 07/02/2023, MRI 07/03/2023 FINDINGS: Lower chest: No pulmonary nodules the lung bases. Hepatobiliary: Lesion lateral margin of the RIGHT hepatic lobe is increased in the interval from prior CT measuring 4.6 x 3.6 cm compared to 3.7 x 2.8 cm. A small cystic collection adjacent to the expanding mass is not changed measuring 1.3 cm compared to 1.3 cm. Additionally cystic lesion inferior to the hepatic mass measures 4. 5 cm compared to 4.7 cm for no change. Gallbladder unchanged. Several small cystic lesions in the LEFT hepatic lobe unchanged. Small low-density lesions centrally in liver measuring 7 mm on image 13/2 is indeterminate. Similar lesion in the LEFT hepatic lobe more lateral measuring 6 mm image 18 indeterminate. These lesions are not well appreciated on comparison MRI. Comparison CT with noncontrast. Pancreas: Pancreas is normal. No ductal dilatation. No pancreatic inflammation. Spleen: Normal spleen Adrenals/urinary tract: Adrenal glands and kidneys are normal. The ureters and bladder normal. Stomach/Bowel: Small diverticulum posterior to the gastric fundus. Stomach, duodenum and small-bowel normal. Ascending transverse and descending colon appear normal. There is fluid in the rectum. Vascular/Lymphatic: Abdominal aorta is normal caliber with atherosclerotic calcification. There is no retroperitoneal or periportal lymphadenopathy. No pelvic lymphadenopathy. Reproductive: Prostate unremarkable Other: No free fluid. Musculoskeletal: No aggressive osseous lesion. IMPRESSION: 1. Enlarging mass in the RIGHT hepatic lobe consistent with biopsy-proven adenocarcinoma. 2.  Two small hypodense lesions in LEFT hepatic lobe are indeterminate 3. No evidence of bowel inflammation. No stenosis or fistula. Fluid in rectum. Electronically Signed   By: Jackquline Boxer M.D.   On: 08/20/2023 11:53      LOS: 1 day    Elgin Lam, MD [facility] 08/22/2023, 10:50 AM   If 7PM-7AM, please contact night-coverage [email or web address]

## 2023-08-22 NOTE — Anesthesia Postprocedure Evaluation (Signed)
 Anesthesia Post Note  Patient: [name]  Procedure(s) Performed: COLONOSCOPY     Patient location during evaluation: PACU Anesthesia Type: MAC Level of consciousness: awake Pain management: pain level controlled Vital Signs Assessment: post-procedure vital signs reviewed and stable Respiratory status: spontaneous breathing, nonlabored ventilation and respiratory function stable Cardiovascular status: stable and blood pressure returned to baseline Postop Assessment: no apparent nausea or vomiting Anesthetic complications: no   No notable events documented.  Last Vitals:  Vitals:   08/22/23 1430 08/22/23 1440  BP: 121/87 (!) 163/105  Pulse: 76 92  Resp: 15 (!) 26  Temp:    SpO2: 97% 97%    Last Pain:  Vitals:   08/22/23 1440  TempSrc:   PainSc: 0-No pain                 Delon Aisha Arch

## 2023-08-22 NOTE — Interval H&P Note (Signed)
 History and Physical Interval Note:  08/22/2023 1:22 PM  [name]  has presented today for surgery, with the diagnosis of lower gastrointestinal bleeding.  The various methods of treatment have been discussed with the patient and family. After consideration of risks, benefits and other options for treatment, the patient has consented to  Procedure(s): COLONOSCOPY (N/A) as a surgical intervention.  The patient's history has been reviewed, patient examined, no change in status, stable for surgery.  I have reviewed the patient's chart and labs.  Questions were answered to the patient's satisfaction.     [name] C [name]

## 2023-08-23 ENCOUNTER — Encounter (HOSPITAL_COMMUNITY): Payer: Self-pay | Admitting: Internal Medicine

## 2023-08-23 DIAGNOSIS — K922 Gastrointestinal hemorrhage, unspecified: Secondary | ICD-10-CM | POA: Diagnosis not present

## 2023-08-23 LAB — CBC
HCT: 36.6 % — ABNORMAL LOW (ref 39.0–52.0)
Hemoglobin: 11.8 g/dL — ABNORMAL LOW (ref 13.0–17.0)
MCH: 31.4 pg (ref 26.0–34.0)
MCHC: 32.2 g/dL (ref 30.0–36.0)
MCV: 97.3 fL (ref 80.0–100.0)
Platelets: 143 10*3/uL — ABNORMAL LOW (ref 150–400)
RBC: 3.76 MIL/uL — ABNORMAL LOW (ref 4.22–5.81)
RDW: 14.6 % (ref 11.5–15.5)
WBC: 11 10*3/uL — ABNORMAL HIGH (ref 4.0–10.5)
nRBC: 0 % (ref 0.0–0.2)

## 2023-08-23 LAB — POTASSIUM: Potassium: 3.3 mmol/L — ABNORMAL LOW (ref 3.5–5.1)

## 2023-08-23 MED ORDER — POTASSIUM CHLORIDE CRYS ER 20 MEQ PO TBCR
40.0000 meq | EXTENDED_RELEASE_TABLET | Freq: Once | ORAL | Status: AC
  Administered 2023-08-23: 40 meq via ORAL
  Filled 2023-08-23: qty 2

## 2023-08-23 NOTE — Discharge Instructions (Addendum)
 [name],  You were in the hospital with bleeding per rectum. This was related to your recent polypectomy. The GI physicians have placed clips to close up the ulcers that were recently bleeding. Please follow-up with your PCP and GI physician.

## 2023-08-23 NOTE — Plan of Care (Signed)
   Problem: Education: Goal: Knowledge of General Education information will improve Description Including pain rating scale, medication(s)/side effects and non-pharmacologic comfort measures Outcome: Progressing   Problem: Health Behavior/Discharge Planning: Goal: Ability to manage health-related needs will improve Outcome: Progressing

## 2023-08-23 NOTE — Discharge Summary (Signed)
 Physician Discharge Summary   Patient: [name] MRN: [id number] DOB: 02-20-1966  Admit date:     08/20/2023  Discharge date: 08/23/23  Discharge Physician: Elgin Lam, MD   PCP: [name], MD   Recommendations at discharge:  PCP visit for hospital follow-up GI visit for hospital follow-up  Discharge Diagnoses: Principal Problem:   GI bleed Active Problems:   Bloody diarrhea  Resolved Problems:   * No resolved hospital problems. *  Hospital Course: [name] is a 58 y.o. male with a history of cirrhosis of liver, PSC/autoimmune hepatitis, Crohn's disease, anxiety, depression, PTSD, osteoarthritis, adenocarcinoma of the liver.  Patient presented secondary to bloody diarrhea in setting of recent colonoscopy with polypectomy.  GI consulted and performed a colonoscopy on 6/23 revealing 3 polypectomy ulcers with stigmata of recent bleeding; clips placed for management.  Assessment and Plan:  Hematochezia In setting of recent colonoscopy with polypectomies.  Gastroenterology consult on admission with initial recommendations for observation.  Patient with continued bloody stools, so colonoscopy was performed on 6/23 revealing diverticulosis, nonbleeding internal hemorrhoids, multiple polyps in the sigmoid, descending, transverse, ascending colon in addition to 3 polypectomy ulcers with stigmata of recent bleeding with clips placed.  Hemoglobin stable prior to discharge.   Hypokalemia Mild. Patient given potassium supplementation.   Adenocarcinoma of the liver Noted. Concern for biliary tract origin vs GI tract origin. Patient with recent colonoscopy and biopsy.   PSC Autoimmune hepatitis Cirrhosis of the liver Noted. Mildly elevated AST/ALT.   History of Crohn's colitis Noted. Continue prednisone    History of C. Difficile colitis Patient is on a Vancomycin  taper. Continue Vancomycin  PO   Anxiety Depression PTSD Continue venlafaxine ,  mirtazapine    Consultants: Gastroenterology Procedures performed: Colonoscopy Disposition: Home Diet recommendation: Regular diet   DISCHARGE MEDICATION: Allergies as of 08/23/2023       Reactions   Tape Itching, Rash, Other (See Comments)   Please use paper tape   Asacol  [mesalamine ] Diarrhea, Other (See Comments)   Abd pain, too   Azathioprine Diarrhea, Other (See Comments)   Abd pain, too   Gluten Meal Diarrhea   Metoprolol  Tartrate Nausea Only, Other (See Comments)   Stomach upset   Mycophenolate Mofetil Diarrhea, Other (See Comments)   Abd pain, too   Other Diarrhea, Other (See Comments)   NUTS   Wheat Diarrhea, Other (See Comments)   Constipation; flatulence; abd pain (can tolerate sometimes)   Abilify  [aripiprazole ] Diarrhea   Amoxicillin  Other (See Comments)   Lots of gas        Medication List     PAUSE taking these medications    busPIRone  7.5 MG tablet Wait to take this until your doctor or other care provider tells you to start again. Commonly known as: BUSPAR  Take 7.5 mg by mouth 3 (three) times daily.       STOP taking these medications    LORazepam  0.5 MG tablet Commonly known as: ATIVAN        TAKE these medications    Ensure High Protein Liqd Take 237 mLs by mouth in the morning and at bedtime.   ENTYVIO  IV Inject into the vein every 6 (six) weeks.   hydrOXYzine  25 MG tablet Commonly known as: ATARAX  Take 1 tablet (25 mg total) by mouth 3 (three) times daily as needed for anxiety. What changed: when to take this   mirtazapine  30 MG tablet Commonly known as: REMERON  Take 0.5 tablets (15 mg total) by mouth at bedtime. What changed: how  much to take   predniSONE  20 MG tablet Commonly known as: DELTASONE  TAKE 1.5 TABLETS (30 MG TOTAL) BY MOUTH DAILY WITH BREAKFAST.   vancomycin  125 MG capsule Commonly known as: Vancocin  On day 15 take 1 capsule TID x 7 days then BID x 7 days then daily x 7 days What changed:  how much to  take how to take this when to take this additional instructions   venlafaxine  XR 75 MG 24 hr capsule Commonly known as: EFFEXOR -XR Take 75 mg by mouth daily.   venlafaxine  XR 150 MG 24 hr capsule Commonly known as: EFFEXOR -XR Take 1 capsule (150 mg total) by mouth daily with breakfast.        Follow-up Information     [name], MD. Schedule an appointment as soon as possible for a visit in 1 week(s).   Specialty: Family Medicine Why: For hospital follow-up Contact information: [address] [place] [address] [phone]                Discharge Exam: BP (!) 156/95 (BP Location: Left Arm)   Pulse 81   Temp 98 F (36.7 C) (Oral)   Resp 16   Ht 6' 2 (1.88 m)   Wt 68.2 kg   SpO2 99%   BMI 19.30 kg/m   General exam: Appears calm and comfortable Respiratory system: Clear to auscultation. Respiratory effort normal. Cardiovascular system: S1 & S2 heard, RRR. No murmurs, rubs, gallops or clicks. Gastrointestinal system: Abdomen is nondistended, soft and nontender. Normal bowel sounds heard. Central nervous system: Alert and oriented. No focal neurological deficits. Musculoskeletal: No edema. No calf tenderness Skin: No cyanosis. No rashes Psychiatry: Judgement and insight appear normal. Mood & affect appropriate.   Condition at discharge: stable  The results of significant diagnostics from this hospitalization (including imaging, microbiology, ancillary and laboratory) are listed below for reference.   Imaging Studies: CT ABDOMEN PELVIS W CONTRAST Result Date: 08/20/2023 CLINICAL DATA:  New onset bloody stool. History of Crohn's disease. Recent ultrasound-guided liver biopsy. Well differentiated adenocarcinoma on liver biopsy. * Tracking Code: BO *. EXAM: CT ABDOMEN AND PELVIS WITH CONTRAST TECHNIQUE: Multidetector CT imaging of the abdomen and pelvis was performed using the standard protocol following bolus administration of intravenous  contrast. RADIATION DOSE REDUCTION: This exam was performed according to the departmental dose-optimization program which includes automated exposure control, adjustment of the mA and/or kV according to patient size and/or use of iterative reconstruction technique. CONTRAST:  OMNIPAQUE  IOHEXOL  300 MG/ML  SOLN COMPARISON:  CT 07/02/2023, MRI 07/03/2023 FINDINGS: Lower chest: No pulmonary nodules the lung bases. Hepatobiliary: Lesion lateral margin of the RIGHT hepatic lobe is increased in the interval from prior CT measuring 4.6 x 3.6 cm compared to 3.7 x 2.8 cm. A small cystic collection adjacent to the expanding mass is not changed measuring 1.3 cm compared to 1.3 cm. Additionally cystic lesion inferior to the hepatic mass measures 4. 5 cm compared to 4.7 cm for no change. Gallbladder unchanged. Several small cystic lesions in the LEFT hepatic lobe unchanged. Small low-density lesions centrally in liver measuring 7 mm on image 13/2 is indeterminate. Similar lesion in the LEFT hepatic lobe more lateral measuring 6 mm image 18 indeterminate. These lesions are not well appreciated on comparison MRI. Comparison CT with noncontrast. Pancreas: Pancreas is normal. No ductal dilatation. No pancreatic inflammation. Spleen: Normal spleen Adrenals/urinary tract: Adrenal glands and kidneys are normal. The ureters and bladder normal. Stomach/Bowel: Small diverticulum posterior to the  gastric fundus. Stomach, duodenum and small-bowel normal. Ascending transverse and descending colon appear normal. There is fluid in the rectum. Vascular/Lymphatic: Abdominal aorta is normal caliber with atherosclerotic calcification. There is no retroperitoneal or periportal lymphadenopathy. No pelvic lymphadenopathy. Reproductive: Prostate unremarkable Other: No free fluid. Musculoskeletal: No aggressive osseous lesion. IMPRESSION: 1. Enlarging mass in the RIGHT hepatic lobe consistent with biopsy-proven adenocarcinoma. 2. Two small  hypodense lesions in LEFT hepatic lobe are indeterminate 3. No evidence of bowel inflammation. No stenosis or fistula. Fluid in rectum. Electronically Signed   By: Jackquline Boxer M.D.   On: 08/20/2023 11:53   US  BIOPSY (LIVER) Result Date: 07/29/2023 CLINICAL DATA:  Lesion in question on CT scan yesterday measures approximately 3.7 x 2.8 x 4.3 cm in the lateral aspect of the hepatic dome. Lesion shows heterogeneous enhancement after IV contrast administration, suspicious for neoplasm. Given the history of primary sclerosing cholangitis, cholangiocarcinoma would be a distinct concern. Tissue sampling recommended. EXAM: ULTRASOUND-GUIDED CORE LIVER BIOPSY TECHNIQUE: An ultrasound guided liver biopsy was thoroughly discussed with the patient and questions were answered. The benefits, risks, alternatives, and complications were also discussed. The patient understands and wishes to proceed with the procedure. A verbal as well as written consent was obtained. Survey ultrasound of the liver was performed, right lobe lesion localized, and an appropriate skin entry site was determined. Skin site was marked, prepped with chlorhexidine , and draped in usual sterile fashion, and infiltrated locally with 1% lidocaine . Intravenous Fentanyl  100mcg and Versed  2mg  were administered by RN during a total moderate (conscious) sedation time of 10 minutes; the patient's level of consciousness and physiological / cardiorespiratory status were monitored continuously by radiology RN under my direct supervision. A 17 gauge trocar needle was advanced under ultrasound guidance into the liver to the margin of the lesion. Multiple solid-appearing coaxial 18gauge core samples were then obtained through the guide needle. The guide needle was removed. Post procedure scans demonstrate no hemorrhage or other apparent complication. COMPLICATIONS: COMPLICATIONS None immediate FINDINGS: Complex right lobe liver lesion measuring 3.8 cm is identified  corresponding to CT findings. Representative core biopsy samples obtained as above. IMPRESSION: 1. Technically successful ultrasound guided core liver lesion biopsy. Electronically Signed   By: JONETTA Faes M.D.   On: 07/29/2023 16:26   DG Knee Complete 4 Views Right Result Date: 07/26/2023 CLINICAL DATA:  Pain after fall. EXAM: RIGHT KNEE - COMPLETE 4+ VIEW COMPARISON:  None Available. FINDINGS: No evidence of acute fracture, dislocation, or joint effusion. No evidence of significant arthropathy. Vascular calcifications are noted. No significant focal soft tissue swelling. IMPRESSION: No acute osseous abnormality. Electronically Signed   By: Harrietta Sherry M.D.   On: 07/26/2023 14:15    Microbiology: Results for orders placed or performed during the hospital encounter of 07/02/23  Culture, blood (Routine X 2) w Reflex to ID Panel     Status: None   Collection Time: 07/02/23  6:24 PM   Specimen: BLOOD RIGHT ARM  Result Value Ref Range Status   Specimen Description BLOOD RIGHT ARM  Final   Special Requests   Final    Blood Culture results may not be optimal due to an inadequate volume of blood received in culture bottles AEROBIC BOTTLE ONLY   Culture   Final    NO GROWTH 5 DAYS Performed at [facility], [address] [place], [place] [address]    Report Status 07/07/2023 FINAL  Final  Culture, blood (Routine X 2) w Reflex to ID Panel  Status: None   Collection Time: 07/02/23  6:24 PM   Specimen: BLOOD RIGHT ARM  Result Value Ref Range Status   Specimen Description BLOOD RIGHT ARM  Final   Special Requests   Final    Blood Culture results may not be optimal due to an inadequate volume of blood received in culture bottles AEROBIC BOTTLE ONLY   Culture   Final    NO GROWTH 5 DAYS Performed at [facility], [address] [place], [place] [address]    Report Status 07/07/2023 FINAL  Final  Gastrointestinal Panel by PCR , Stool     Status: Abnormal   Collection Time:  07/02/23  7:47 PM   Specimen: STOOL  Result Value Ref Range Status   Campylobacter species NOT DETECTED NOT DETECTED Final   Plesimonas shigelloides NOT DETECTED NOT DETECTED Final   Salmonella species NOT DETECTED NOT DETECTED Final   Yersinia enterocolitica NOT DETECTED NOT DETECTED Final   Vibrio species NOT DETECTED NOT DETECTED Final   Vibrio cholerae NOT DETECTED NOT DETECTED Final   Enteroaggregative E coli (EAEC) NOT DETECTED NOT DETECTED Final   Enteropathogenic E coli (EPEC) NOT DETECTED NOT DETECTED Final   Enterotoxigenic E coli (ETEC) NOT DETECTED NOT DETECTED Final   Shiga like toxin producing E coli (STEC) NOT DETECTED NOT DETECTED Final   Shigella/Enteroinvasive E coli (EIEC) NOT DETECTED NOT DETECTED Final   Cryptosporidium NOT DETECTED NOT DETECTED Final   Cyclospora cayetanensis NOT DETECTED NOT DETECTED Final   Entamoeba histolytica NOT DETECTED NOT DETECTED Final   Giardia lamblia NOT DETECTED NOT DETECTED Final   Adenovirus F40/41 NOT DETECTED NOT DETECTED Final   Astrovirus NOT DETECTED NOT DETECTED Final   Norovirus GI/GII DETECTED (A) NOT DETECTED Final    Comment: CRITICAL RESULT CALLED TO, READ BACK BY AND VERIFIED WITH: CHARLENA MOTTO RN @ [phone] 07/03/23 [facility]    Rotavirus A NOT DETECTED NOT DETECTED Final   Sapovirus (I, II, IV, and V) NOT DETECTED NOT DETECTED Final    Comment: Performed at [facility], [address] [place], [place] [address]  C Difficile Quick Screen w PCR reflex     Status: Abnormal   Collection Time: 07/02/23  7:47 PM   Specimen: STOOL  Result Value Ref Range Status   C Diff antigen POSITIVE (A) NEGATIVE Final   C Diff toxin NEGATIVE NEGATIVE Final   C Diff interpretation Results are indeterminate. See PCR results.  Final    Comment: Performed at [facility], [address] [place], [place] [address]  C. Diff by PCR, Reflexed     Status: Abnormal   Collection Time: 07/02/23  7:47 PM  Result Value Ref Range  Status   Toxigenic C. Difficile by PCR POSITIVE (A) NEGATIVE Final    Comment: Positive for toxigenic C. difficile with little to no toxin production. Only treat if clinical presentation suggests symptomatic illness. Performed at [facility], [address] [place], [place] [address]    *Note: Due to a large number of results and/or encounters for the requested time period, some results have not been displayed. A complete set of results can be found in Results Review.    Labs: CBC: Recent Labs  Lab 08/20/23 1015 08/20/23 1713 08/21/23 0544 08/22/23 0436 08/23/23 0428  WBC 15.3* 15.6* 13.8* 8.4 11.0*  NEUTROABS 13.1*  --   --   --   --   HGB 13.8 14.7 13.7 11.6* 11.8*  HCT  41.9 44.1 40.1 34.9* 36.6*  MCV 93.5 94.4 92.2 94.6 97.3  PLT 161 164 123* 157 143*   Basic Metabolic Panel: Recent Labs  Lab 08/20/23 1015 08/21/23 0811 08/23/23 0428  NA 138 138  --   K 3.8 3.3* 3.3*  CL 103 103  --   CO2 30 26  --   GLUCOSE 108* 96  --   BUN 24* 19  --   CREATININE 0.97 1.15  --   CALCIUM 8.4* 8.4*  --    Liver Function Tests: Recent Labs  Lab 08/21/23 0811  AST 49*  ALT 67*  ALKPHOS 128*  BILITOT 0.8  PROT 5.1*  ALBUMIN 2.7*    Discharge time spent: 35 minutes.  Signed: Elgin Lam, MD [facility] 08/23/2023

## 2023-08-25 DIAGNOSIS — F330 Major depressive disorder, recurrent, mild: Secondary | ICD-10-CM | POA: Diagnosis not present

## 2023-08-25 DIAGNOSIS — F411 Generalized anxiety disorder: Secondary | ICD-10-CM | POA: Diagnosis not present

## 2023-08-26 ENCOUNTER — Inpatient Hospital Stay

## 2023-08-26 ENCOUNTER — Inpatient Hospital Stay: Attending: Oncology | Admitting: Oncology

## 2023-08-26 ENCOUNTER — Encounter: Payer: Self-pay | Admitting: Oncology

## 2023-08-26 VITALS — BP 146/82 | HR 111 | Temp 98.2°F | Resp 20 | Ht 74.0 in | Wt 151.3 lb

## 2023-08-26 DIAGNOSIS — C23 Malignant neoplasm of gallbladder: Secondary | ICD-10-CM | POA: Diagnosis not present

## 2023-08-26 DIAGNOSIS — K754 Autoimmune hepatitis: Secondary | ICD-10-CM | POA: Diagnosis not present

## 2023-08-26 DIAGNOSIS — C787 Secondary malignant neoplasm of liver and intrahepatic bile duct: Secondary | ICD-10-CM | POA: Diagnosis not present

## 2023-08-26 DIAGNOSIS — K50813 Crohn's disease of both small and large intestine with fistula: Secondary | ICD-10-CM

## 2023-08-26 DIAGNOSIS — C221 Intrahepatic bile duct carcinoma: Secondary | ICD-10-CM

## 2023-08-26 DIAGNOSIS — Z87891 Personal history of nicotine dependence: Secondary | ICD-10-CM

## 2023-08-26 DIAGNOSIS — C799 Secondary malignant neoplasm of unspecified site: Secondary | ICD-10-CM

## 2023-08-26 NOTE — Assessment & Plan Note (Addendum)
 Intrahepatic cholangiocarcinoma, diagnosed following MRI and liver biopsy in May 2025.  He underwent ultrasound-guided biopsy of liver lesion on 07/29/2023.  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 CDX-2 positive, CK20  positive, CK7 positive, synaptophysin negative, chromogranin negative.  IHC profile suggested a gastrointestinal or pancreaticobiliary primary.  To look for a primary, patient underwent upper endoscopy and colonoscopy on 08/18/2023, by Dr. Avram.  EGD showed evidence of gastritis, single gastric polyp.  Otherwise unremarkable.  Colonoscopy showed several polyps in the transverse colon, sigmoid colon, descending colon.  Evidence of diverticulosis in the sigmoid colon.  Multiple biopsies were obtained.  Biopsies from the stomach, colon, cecum all showed benign findings.  No evidence of malignancy noted.  Clinical picture is consistent with intrahepatic cholangiocarcinoma.  Given history of autoimmune hepatitis, primary sclerosing cholangitis, he would not be a candidate for immunotherapy.  Hence plan would be to proceed with chemotherapy using cisplatin and gemcitabine.  Request submitted for staging PET scan.  Also request submitted for NGS testing on the specimen and liquid biopsy.  We will also discuss his case in GI multidisciplinary tumor conference to see if he would be candidate for surgery eventually.  - Administer chemotherapy with cisplatin and gemcitabine once weekly for three weeks on, one week off, or two weeks on, one week off, depending on tolerance.  - Place a port for chemotherapy administration and blood work.  - Monitor kidney function and ensure adequate hydration to prevent kidney dysfunction.  - Monitor blood counts, liver function, and for side effects such as nausea, fatigue, hearing loss, and neuropathy.  I will plan to see him in the next 2 weeks to begin treatments, pending  above workup/Port-A-Cath placement.

## 2023-08-26 NOTE — Assessment & Plan Note (Signed)
 Autoimmune hepatitis diagnosed in 2012, managed with prednisone . Immunotherapy/checkpoint inhibitor therapy is contraindicated due to the risk of exacerbating liver issues.

## 2023-08-26 NOTE — Progress Notes (Signed)
 PATIENT NAVIGATOR PROGRESS NOTE  Name: [name] Date: 08/26/2023 MRN: [id number]  DOB: 01/22/1966   Reason for visit:  New Patient Appt  Comments:   Patient seen in office during his initial visit with Dr. Chinita Patten.  Patient given [facility] with information specific to his diagnosis. Patient given my direct contact information and was instructed to call with any questions or concerns after today's visit.  SDOH were assessed. No urgent needs were noted. Patient states he lives with his parents in [place] and his brother and daughter live close by.  The following referrals have been entered:  Social Work Nutrition Spiritual Care The following orders have been entered:  Port placement  Will follow-up next week to ensure appts have been scheduled.   Time spent counseling/coordinating care: > 60 minutes

## 2023-08-26 NOTE — Progress Notes (Addendum)
 [facility]  ONCOLOGY CONSULT NOTE   PATIENT NAME: [name]   MR#: [id number] DOB: 04-14-1965  DATE OF SERVICE: 08/26/2023   REFERRING PROVIDER  Hospital follow up  Patient Care Team: [name], MD as PCP - General (Family Medicine) Avram Lupita BRAVO, MD as Attending Physician (Gastroenterology) Harvey Carlin BRAVO, MD (Inactive) as Attending Physician (Vascular Surgery) Ardis Evalene CROME, RN as Oncology Nurse Navigator    CHIEF COMPLAINT/ PURPOSE OF CONSULTATION:   Newly diagnosed intrahepatic cholangiocarcinoma  ASSESSMENT & PLAN:   [name] is a 58 y.o. gentleman with a past medical history of primary sclerosing cholangitis, autoimmune hepatitis, Crohn's disease, anxiety/depression, PTSD, past C. difficile colitis, chronic pain syndrome, chronic mesenteric ischemia, basal cell carcinoma of left lower extremity, history of suicide attempt, past portal vein thrombosis, was referred to our clinic for newly diagnosed metastatic adenocarcinoma with liver mets, presumed to be pancreaticobiliary primary.  Clinical picture suggestive of cholangiocarcinoma.  Intrahepatic cholangiocarcinoma ([name]) Intrahepatic cholangiocarcinoma, diagnosed following MRI and liver biopsy in May 2025.  He underwent ultrasound-guided biopsy of liver lesion on 07/29/2023.  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 CDX-2 positive, CK20  positive, CK7 positive, synaptophysin negative, chromogranin negative.  IHC profile suggested a gastrointestinal or pancreaticobiliary primary.  To look for a primary, patient underwent upper endoscopy and colonoscopy on 08/18/2023, by Dr. Avram.  EGD showed evidence of gastritis, single gastric polyp.  Otherwise unremarkable.  Colonoscopy showed several polyps in the transverse colon, sigmoid colon, descending colon.  Evidence of diverticulosis in the sigmoid  colon.  Multiple biopsies were obtained.  Biopsies from the stomach, colon, cecum all showed benign findings.  No evidence of malignancy noted.  Clinical picture is consistent with intrahepatic cholangiocarcinoma.  Given history of autoimmune hepatitis, primary sclerosing cholangitis, he would not be a candidate for immunotherapy.  Hence plan would be to proceed with chemotherapy using cisplatin and gemcitabine.  Request submitted for staging PET scan.  Also request submitted for NGS testing on the specimen and liquid biopsy.  We will also discuss his case in GI multidisciplinary tumor conference to see if he would be candidate for surgery eventually.  - Administer chemotherapy with cisplatin and gemcitabine once weekly for three weeks on, one week off, or two weeks on, one week off, depending on tolerance.  - Place a port for chemotherapy administration and blood work.  - Monitor kidney function and ensure adequate hydration to prevent kidney dysfunction.  - Monitor blood counts, liver function, and for side effects such as nausea, fatigue, hearing loss, and neuropathy.  I will plan to see him in the next 2 weeks to begin treatments, pending above workup/Port-A-Cath placement.  Autoimmune hepatitis-PSC overlap Autoimmune hepatitis diagnosed in 2012, managed with prednisone . Immunotherapy/checkpoint inhibitor therapy is contraindicated due to the risk of exacerbating liver issues.  CROHN'S DISEASE, LARGE AND SMALL INTESTINES Crohn's disease managed with Entyvio . No current symptoms of concern such as blood in stools or black stools reported.    I reviewed lab results and outside records for this visit and discussed relevant results with the patient. Diagnosis, plan of care and treatment options were also discussed in detail with the patient. Opportunity provided to ask questions and answers provided to his apparent satisfaction. Provided instructions to call our clinic with any problems,  questions or concerns prior to return visit. I recommended to continue follow-up with PCP and sub-specialists. He verbalized understanding and agreed with the  plan. No barriers to learning was detected.  NCCN guidelines have been consulted in the planning of this patient's care.  Chinita Patten, MD  08/26/2023 5:58 PM  [facility] - A DEPT OF [facility] [address] [place] [address] Dept: [phone] Dept Fax: [phone]   HISTORY OF PRESENTING ILLNESS:   I have reviewed his chart and materials related to his cancer extensively and collaborated history with the patient. Summary of oncologic history is as follows:  ONCOLOGY HISTORY:  He has a history of autoimmune hepatitis, primary sclerosing cholangitis diagnosed in 2012, and Crohn's disease for which he is currently taking prednisone  and Entyvio .  He was admitted to the hospital on 07/02/2023 after he presented with complaints of multiple episodes of diarrhea, dizziness, near syncope.  CT scan of the abdomen and pelvis on 07/02/2023 showed new 3.7 x 2.8 cm noncystic lesion in the dome of the liver, which was not present on prior studies.  Given history of primary sclerosing cholangitis, there was concern for cholangiocarcinoma and MRI was recommended.  On 07/03/2023, MRI of the abdomen showed 4.3 x 2.8 x 3.7 cm lesion in the lateral aspect of the hepatic dome which showed heterogeneous enhancement after IV contrast administration, suspicious for neoplasm.  Cholangiocarcinoma was considered because of his history of primary sclerosing cholangitis.  Inferior and posterior to this lesion was a cyst measuring 5 x 4.5 cm, slightly increased in size compared to previous MRI.  Bile duct showed variable dilatation and stenosis.  Patient did not want to stay in the hospital for biopsy at that time.  He underwent ultrasound-guided biopsy of liver lesion on 07/29/2023.  Pathology  showed poorly differentiated adenocarcinoma with signet ring cell features, favoring a metastatic lesion. CDX-2 positive, CK20 positive, CK7 positive, synaptophysin negative, chromogranin negative.  IHC profile suggested a gastrointestinal or pancreaticobiliary primary.  To look for a primary, patient underwent upper endoscopy and colonoscopy on 08/18/2023, by Dr. Avram.  EGD showed evidence of gastritis, single gastric polyp.  Otherwise unremarkable.  Colonoscopy showed several polyps in the transverse colon, sigmoid colon, descending colon.  Evidence of diverticulosis in the sigmoid colon.  Multiple biopsies were obtained.  Biopsies from the stomach, colon, cecum all showed benign findings.  No evidence of malignancy noted.  Clinical picture is consistent with intrahepatic cholangiocarcinoma.  Given history of autoimmune hepatitis, primary sclerosing cholangitis, he would not be a candidate for immunotherapy.  Hence plan would be to proceed with chemotherapy using cisplatin and gemcitabine.  Request submitted for staging PET scan.  Also request submitted for NGS testing on the specimen and liquid biopsy.  We will also discuss his case in GI multidisciplinary tumor conference to determine the feasibility of surgery eventually.  Oncology History  Cholangiocarcinoma metastatic to liver ([name])  08/18/2023 Initial Diagnosis   Cholangiocarcinoma metastatic to liver ([name])   08/26/2023 Cancer Staging   Staging form: Intrahepatic Bile Duct, AJCC 8th Edition - Clinical: Stage IV (cTX, cNX, pM1) - Signed by Patten Chinita, MD on 08/26/2023     INTERVAL HISTORY:  Discussed the use of AI scribe software for clinical note transcription with the patient, who gave verbal consent to proceed.  History of Present Illness Doroteo Nickolson is a 58 year old male with gallbladder cancer and autoimmune hepatitis who presents for oncology consultation. He was referred by Dr. Avram for evaluation of  his gallbladder cancer.  He was diagnosed with gallbladder cancer  with liver metastasis following a workup that began in early May 2025. Initially, he presented to the hospital with symptoms of diarrhea, dizziness, and a fall. An MRI conducted in early May raised concerns for gallbladder cancer, and a liver biopsy performed at the end of May confirmed adenocarcinoma.  He has a history of autoimmune hepatitis and sclerosing cholangitis diagnosed in 2012, for which he is currently taking prednisone  and Entyvio . These conditions complicate potential treatment options due to the risk of exacerbating liver issues.  No current pain, nausea, or vomiting, and he is maintaining his weight. No recent blood in stools or black stools.  He has been sober for 28 years, does not smoke, and primarily drinks water and a little tea.    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 06/15/2018   Cancer of skin of neck    Cataract    Chronic mesenteric ischemia ([name])    Chronic pain syndrome 07/09/2016   Crohn's disease of small and large intestines ([name])    followed by dr lupita gessner   Dairy product intolerance    Diarrhea, functional    Family history of adverse reaction to anesthesia    Grover's disease    transient acantholytic dermatosis   History of alcohol abuse    History of basal cell carcinoma excision    2013 left leg   History of Clostridium difficile    10/ 2014   History of multiple concussions    x6   last one Jan 2017 per pt--  no residual   History of substance abuse ([name])    quit 1997 per pt   History of suicide attempt    05-18-2012  overdose /  failure to thrive   Iron  deficiency anemia due to chronic blood loss    Major depression,  recurrent, chronic ([name])    Osteopenia    Personal history of adenomatous colonic polyps 12/2010, 03/2012   12/2010 - 8 mm serrated adenoma of rectum   Portal vein thrombosis 03/21/2015   right   Primary sclerosing cholangitis    ? 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   Substance abuse ([name]) 1997   Alcohol   Vitamin A  deficiency 05/23/2018   Vitamin B6 deficiency 07/27/2018   Vitamin C deficiency 05/23/2018    SURGICAL HISTORY: Past Surgical History:  Procedure Laterality Date   ABDOMINAL AORTAGRAM N/A 03/29/2012   Procedure: ABDOMINAL EZELLA;  Surgeon: Gaile LELON New, MD;  Location: [facility] CATH LAB;  Service: Cardiovascular;  Laterality: N/A;   ADENOIDECTOMY  age 32   BIOPSY  05/31/2018   Procedure: BIOPSY;  Surgeon: Teressa Toribio SQUIBB, MD;  Location: [facility];  Service: Endoscopy;;   CATARACT EXTRACTION W/ INTRAOCULAR LENS  IMPLANT, BILATERAL  2009   COLONOSCOPY  2001, 05/02/2003, 01/28/11   2012: Right colon Crohn's, rectal polyp   COLONOSCOPY  03/31/2012   Procedure: COLONOSCOPY;  Surgeon: Gordy CHRISTELLA Starch, MD;  Location: [facility] ENDOSCOPY;  Service: Gastroenterology;  Laterality: N/A;   COLONOSCOPY N/A 08/18/2023   Procedure: COLONOSCOPY;  Surgeon: avram lupita BRAVO, MD;  Location: [facility];  Service: Gastroenterology;  Laterality:  N/A;   COLONOSCOPY N/A 08/22/2023   Procedure: COLONOSCOPY;  Surgeon: Federico Rosario BROCKS, MD;  Location: [facility];  Service: Gastroenterology;  Laterality: N/A;   COLONOSCOPY WITH PROPOFOL  N/A 05/31/2018   Procedure: COLONOSCOPY WITH PROPOFOL ;  Surgeon: Teressa Toribio SQUIBB, MD;  Location: [facility];  Service: Endoscopy;  Laterality: N/A;   ESOPHAGOGASTRODUODENOSCOPY  01/28/2011   Normal   ESOPHAGOGASTRODUODENOSCOPY N/A 08/18/2023   Procedure: EGD (ESOPHAGOGASTRODUODENOSCOPY);  Surgeon: Avram Lupita BRAVO, MD;  Location: [facility];  Service: Gastroenterology;  Laterality: N/A;   FOOT  SURGERY Right age 75   MOHS SURGERY Left 11/2013   left ankle parakerotosis    PERCUTANEOUS LIVER BIOPSY  2007 and 2008   PILONIDAL CYST EXCISION  age 44   PLACEMENT OF SETON N/A 11/06/2015   Procedure: PLACEMENT OF SETON;  Surgeon: Bernarda Ned, MD;  Location: [facility];  Service: General;  Laterality: N/A;   PLACEMENT OF SETON  07/2018   at [facility]   RECTAL EXAM UNDER ANESTHESIA N/A 01/18/2019   Procedure: ANAL EXAM UNDER ANESTHESIA,  INCISION AND DRAINAGE, SETON PLACEMENT;  Surgeon: Ned Bernarda, MD;  Location: [name];  Service: General;  Laterality: N/A;   UPPER GASTROINTESTINAL ENDOSCOPY      SOCIAL HISTORY: Social History   Socioeconomic History   Marital status: Single    Spouse name: Not on file   Number of children: 1   Years of education: Not on file   Highest education level: Not on file  Occupational History   Occupation: Disability   Tobacco Use   Smoking status: Former    Current packs/day: 0.00    Average packs/day: 1 pack/day for 15.0 years (15.0 ttl pk-yrs)    Types: Cigarettes    Start date: 03/28/1982    Quit date: 03/28/1997    Years since quitting: 26.4   Smokeless tobacco: Never  Vaping Use   Vaping status: Never Used  Substance and Sexual Activity   Alcohol use: No    Alcohol/week: 0.0 standard drinks of alcohol   Drug use: Not Currently    Types: Cocaine, Marijuana    Comment: QUIT USING DRUGS IN 1997   Sexual activity: Not Currently  Other Topics Concern   Not on file  Social History Narrative   Single, history of substance abuse in recovery   Previously occupied on medical disability   1 child  Daughter Wellsite geologist, lives and works in [place]   Elderly parents involved and he helps them   1 brother   Social Drivers of Corporate investment banker Strain: Low Risk  (08/28/2019)   Overall Financial Resource Strain (CARDIA)    Difficulty of Paying Living Expenses: Not hard at all  Food Insecurity: No  Food Insecurity (08/26/2023)   Hunger Vital Sign    Worried About Running Out of Food in the Last Year: Never true    Ran Out of Food in the Last Year: Never true  Transportation Needs: No Transportation Needs (08/26/2023)   PRAPARE - Administrator, Civil Service (Medical): No    Lack of Transportation (Non-Medical): No  Physical Activity: Insufficiently Active (11/08/2019)   Exercise Vital Sign    Days of Exercise per Week: 3 days    Minutes of Exercise per Session: 30 min  Stress: Stress Concern Present (08/28/2019)   [employer] of Occupational Health - Occupational Stress Questionnaire    Feeling of Stress : Very much  Social Connections: Socially Isolated (07/02/2023)  Social Advertising account executive    Frequency of Communication with Friends and Family: Once a week    Frequency of Social Gatherings with Friends and Family: Once a week    Attends Religious Services: Never    Database administrator or Organizations: No    Attends Banker Meetings: Never    Marital Status: Never married  Intimate Partner Violence: Not At Risk (08/26/2023)   Humiliation, Afraid, Rape, and Kick questionnaire    Fear of Current or Ex-Partner: No    Emotionally Abused: No    Physically Abused: No    Sexually Abused: No    FAMILY HISTORY: Family History  Problem Relation Age of Onset   Heart disease Father    Thyroid  cancer Father    Allergies Father    Clotting disorder Father    Breast cancer Mother    Stomach cancer Mother    Colon cancer Neg Hx    Esophageal cancer Neg Hx    Rectal cancer Neg Hx     ALLERGIES:  He is allergic to tape, asacol  [mesalamine ], azathioprine, gluten meal, metoprolol  tartrate, mycophenolate mofetil, other, wheat, abilify  [aripiprazole ], and amoxicillin .  MEDICATIONS:  Current Outpatient Medications  Medication Sig Dispense Refill   hydrOXYzine  (ATARAX ) 25 MG tablet Take 1 tablet (25 mg total) by mouth 3 (three) times daily as  needed for anxiety. 30 tablet 0   mirtazapine  (REMERON ) 30 MG tablet Take 0.5 tablets (15 mg total) by mouth at bedtime.     Nutritional Supplements (ENSURE HIGH PROTEIN) LIQD Take 237 mLs by mouth in the morning and at bedtime.     predniSONE  (DELTASONE ) 20 MG tablet TAKE 1.5 TABLETS (30 MG TOTAL) BY MOUTH DAILY WITH BREAKFAST. (Patient taking differently: Take 30 mg by mouth daily with breakfast. Patient reports he takes 20 mg daily not 30mg .) 45 tablet 2   vancomycin  (VANCOCIN ) 125 MG capsule On day 15 take 1 capsule TID x 7 days then BID x 7 days then daily x 7 days 42 capsule 0   Vedolizumab  (ENTYVIO  IV) Inject into the vein every 6 (six) weeks.     venlafaxine  XR (EFFEXOR -XR) 150 MG 24 hr capsule Take 1 capsule (150 mg total) by mouth daily with breakfast. 30 capsule 0   venlafaxine  XR (EFFEXOR -XR) 75 MG 24 hr capsule Take 75 mg by mouth daily.     No current facility-administered medications for this visit.    REVIEW OF SYSTEMS:    Review of Systems - Oncology  All other pertinent systems were reviewed with the patient and are negative.  PHYSICAL EXAMINATION:   Onc Performance Status - 08/26/23 1312       ECOG Perf Status   ECOG Perf Status Fully active, able to carry on all pre-disease performance without restriction      KPS SCALE   KPS % SCORE Normal, no compliants, no evidence of disease          Vitals:   08/26/23 1252 08/26/23 1254  BP: (!) 155/76 (!) 146/82  Pulse: (!) 111   Resp: 20   Temp: 98.2 F (36.8 C)   SpO2: 97%    Filed Weights   08/26/23 1252  Weight: 151 lb 4.8 oz (68.6 kg)    Physical Exam Constitutional:      General: He is not in acute distress.    Appearance: Normal appearance.  HENT:     Head: Normocephalic and atraumatic.   Eyes:     Conjunctiva/sclera: Conjunctivae  normal.    Cardiovascular:     Rate and Rhythm: Normal rate and regular rhythm.     Heart sounds: Normal heart sounds.  Pulmonary:     Effort: Pulmonary effort  is normal. No respiratory distress.     Breath sounds: Normal breath sounds.  Abdominal:     General: There is no distension.     Palpations: There is no mass.   Neurological:     General: No focal deficit present.     Mental Status: He is alert and oriented to person, place, and time.   Psychiatric:        Mood and Affect: Mood normal.        Behavior: Behavior normal.     LABORATORY DATA:   I have reviewed the data as listed.  No results found for any visits on 08/26/23.  Last CBC Lab Results  Component Value Date   WBC 11.0 (H) 08/23/2023   HGB 11.8 (L) 08/23/2023   HCT 36.6 (L) 08/23/2023   MCV 97.3 08/23/2023   MCH 31.4 08/23/2023   RDW 14.6 08/23/2023   PLT 143 (L) 08/23/2023     Chemistry      Component Value Date/Time   NA 138 08/21/2023 0811   K 3.3 (L) 08/23/2023 0428   CL 103 08/21/2023 0811   CO2 26 08/21/2023 0811   BUN 19 08/21/2023 0811   CREATININE 1.15 08/21/2023 0811      Component Value Date/Time   CALCIUM 8.4 (L) 08/21/2023 0811   ALKPHOS 128 (H) 08/21/2023 0811   AST 49 (H) 08/21/2023 0811   ALT 67 (H) 08/21/2023 0811   BILITOT 0.8 08/21/2023 0811       RADIOGRAPHIC STUDIES:  I have personally reviewed the radiological images as listed and agree with the findings in the report.  CT ABDOMEN PELVIS W CONTRAST Result Date: 08/20/2023 CLINICAL DATA:  New onset bloody stool. History of Crohn's disease. Recent ultrasound-guided liver biopsy. Well differentiated adenocarcinoma on liver biopsy. * Tracking Code: BO *. EXAM: CT ABDOMEN AND PELVIS WITH CONTRAST TECHNIQUE: Multidetector CT imaging of the abdomen and pelvis was performed using the standard protocol following bolus administration of intravenous contrast. RADIATION DOSE REDUCTION: This exam was performed according to the departmental dose-optimization program which includes automated exposure control, adjustment of the mA and/or kV according to patient size and/or use of iterative  reconstruction technique. CONTRAST:  OMNIPAQUE  IOHEXOL  300 MG/ML  SOLN COMPARISON:  CT 07/02/2023, MRI 07/03/2023 FINDINGS: Lower chest: No pulmonary nodules the lung bases. Hepatobiliary: Lesion lateral margin of the RIGHT hepatic lobe is increased in the interval from prior CT measuring 4.6 x 3.6 cm compared to 3.7 x 2.8 cm. A small cystic collection adjacent to the expanding mass is not changed measuring 1.3 cm compared to 1.3 cm. Additionally cystic lesion inferior to the hepatic mass measures 4. 5 cm compared to 4.7 cm for no change. Gallbladder unchanged. Several small cystic lesions in the LEFT hepatic lobe unchanged. Small low-density lesions centrally in liver measuring 7 mm on image 13/2 is indeterminate. Similar lesion in the LEFT hepatic lobe more lateral measuring 6 mm image 18 indeterminate. These lesions are not well appreciated on comparison MRI. Comparison CT with noncontrast. Pancreas: Pancreas is normal. No ductal dilatation. No pancreatic inflammation. Spleen: Normal spleen Adrenals/urinary tract: Adrenal glands and kidneys are normal. The ureters and bladder normal. Stomach/Bowel: Small diverticulum posterior to the gastric fundus. Stomach, duodenum and small-bowel normal. Ascending transverse and descending colon appear  normal. There is fluid in the rectum. Vascular/Lymphatic: Abdominal aorta is normal caliber with atherosclerotic calcification. There is no retroperitoneal or periportal lymphadenopathy. No pelvic lymphadenopathy. Reproductive: Prostate unremarkable Other: No free fluid. Musculoskeletal: No aggressive osseous lesion. IMPRESSION: 1. Enlarging mass in the RIGHT hepatic lobe consistent with biopsy-proven adenocarcinoma. 2. Two small hypodense lesions in LEFT hepatic lobe are indeterminate 3. No evidence of bowel inflammation. No stenosis or fistula. Fluid in rectum. Electronically Signed   By: Jackquline Boxer M.D.   On: 08/20/2023 11:53   US  BIOPSY (LIVER) Result Date:  07/29/2023 CLINICAL DATA:  Lesion in question on CT scan yesterday measures approximately 3.7 x 2.8 x 4.3 cm in the lateral aspect of the hepatic dome. Lesion shows heterogeneous enhancement after IV contrast administration, suspicious for neoplasm. Given the history of primary sclerosing cholangitis, cholangiocarcinoma would be a distinct concern. Tissue sampling recommended. EXAM: ULTRASOUND-GUIDED CORE LIVER BIOPSY TECHNIQUE: An ultrasound guided liver biopsy was thoroughly discussed with the patient and questions were answered. The benefits, risks, alternatives, and complications were also discussed. The patient understands and wishes to proceed with the procedure. A verbal as well as written consent was obtained. Survey ultrasound of the liver was performed, right lobe lesion localized, and an appropriate skin entry site was determined. Skin site was marked, prepped with chlorhexidine , and draped in usual sterile fashion, and infiltrated locally with 1% lidocaine . Intravenous Fentanyl  100mcg and Versed  2mg  were administered by RN during a total moderate (conscious) sedation time of 10 minutes; the patient's level of consciousness and physiological / cardiorespiratory status were monitored continuously by radiology RN under my direct supervision. A 17 gauge trocar needle was advanced under ultrasound guidance into the liver to the margin of the lesion. Multiple solid-appearing coaxial 18gauge core samples were then obtained through the guide needle. The guide needle was removed. Post procedure scans demonstrate no hemorrhage or other apparent complication. COMPLICATIONS: COMPLICATIONS None immediate FINDINGS: Complex right lobe liver lesion measuring 3.8 cm is identified corresponding to CT findings. Representative core biopsy samples obtained as above. IMPRESSION: 1. Technically successful ultrasound guided core liver lesion biopsy. Electronically Signed   By: JONETTA Faes M.D.   On: 07/29/2023 16:26    Orders  Placed This Encounter  Procedures   NM PET Image Initial (PI) Skull Base To Thigh    Standing Status:   Future    Expected Date:   09/01/2023    Expiration Date:   08/25/2024    If indicated for the ordered procedure, I authorize the administration of a radiopharmaceutical per Radiology protocol:   Yes    Preferred imaging location?:   [facility]   Cancer antigen 19-9    Standing Status:   Future    Expiration Date:   08/25/2024   CEA (Access)    Standing Status:   Future    Expiration Date:   08/25/2024   CBC with Differential (Cancer Center Only)    Standing Status:   Future    Expiration Date:   08/25/2024   CMP (Cancer Center only)    Standing Status:   Future    Expiration Date:   08/25/2024    CODE STATUS:  Code Status History     Date Active Date Inactive Code Status Order ID Comments User Context   08/20/2023 1401 08/23/2023 1406 Full Code [id number]  Lou Claretta HERO, MD ED   07/29/2023 1419 07/30/2023 0518 Full Code [id number]  Faes Sieving, MD HOV   07/02/2023 1533 07/05/2023 2312 Full Code  [id number]  Celinda Alm Lot, MD ED   04/13/2023 1949 05/20/2023 1942 Full Code [id number]  Tony Cathaleen LABOR, PMHNP Inpatient   03/28/2023 2234 04/04/2023 1348 Full Code [id number]  Alan Thurman GAILS, NP Inpatient   03/28/2023 1136 03/28/2023 2111 Full Code [id number]  Minnie Tinnie BRAVO, PA ED   08/26/2018 1234 09/01/2018 2105 Full Code [id number]  Arloa Chroman, PA-C ED   08/25/2018 1240 08/25/2018 2000 Full Code [id number]  Little, Vernell Search, MD ED   07/31/2018 1406 08/01/2018 1446 Full Code [id number]  Zada Elouise BROCKS, PA-C ED   07/19/2018 2045 07/21/2018 1739 DNR [id number]  [place], Eric J, DO Inpatient   05/28/2018 1506 06/01/2018 1222 DNR [id number]  Barbarann Nest, MD Inpatient   05/20/2012 1059 05/25/2012 1606 DNR [id number]  Cherisse Ronal ORN, NP Inpatient   05/18/2012 1723 05/20/2012 1059 Full Code [id number]  Juvenal Harlene PENNER, DO Inpatient    Questions for Most Recent Historical Code Status (Order  [id number])     Question Answer   By: Consent: discussion documented in EHR            Future Appointments  Date Time Provider Department Center  09/09/2023  3:00 PM [facility] PET CT 1 [facility] [place]  10/19/2023 10:30 AM Avram Lupita BRAVO, MD [facility]     I spent a total of 70 minutes during this encounter with the patient including review of chart and various tests results, discussions about plan of care and coordination of care plan.  This document was completed utilizing speech recognition software. Grammatical errors, random word insertions, pronoun errors, and incomplete sentences are an occasional consequence of this system due to software limitations, ambient noise, and hardware issues. Any formal questions or concerns about the content, text or information contained within the body of this dictation should be directly addressed to the provider for clarification.

## 2023-08-26 NOTE — Assessment & Plan Note (Signed)
 Crohn's disease managed with Entyvio . No current symptoms of concern such as blood in stools or black stools reported.

## 2023-08-28 ENCOUNTER — Other Ambulatory Visit: Payer: Self-pay | Admitting: Internal Medicine

## 2023-08-29 ENCOUNTER — Other Ambulatory Visit: Payer: Self-pay

## 2023-08-29 NOTE — Telephone Encounter (Signed)
 I left him a voice mail message to call me back so I can get an update on his diarrhea.

## 2023-08-29 NOTE — Telephone Encounter (Signed)
 I spoke with Bebe and he said no diarrhea that [facility] just sent the request automatically. SABRA

## 2023-08-29 NOTE — Telephone Encounter (Signed)
 [name] should not need a refill if he is not having any more diarrhea. Please check.

## 2023-08-29 NOTE — Telephone Encounter (Signed)
Please advise Sir? 

## 2023-08-30 ENCOUNTER — Telehealth: Payer: Self-pay | Admitting: Oncology

## 2023-08-30 ENCOUNTER — Telehealth: Payer: Self-pay

## 2023-08-30 DIAGNOSIS — Z09 Encounter for follow-up examination after completed treatment for conditions other than malignant neoplasm: Secondary | ICD-10-CM | POA: Diagnosis not present

## 2023-08-30 DIAGNOSIS — H6980 Other specified disorders of Eustachian tube, unspecified ear: Secondary | ICD-10-CM | POA: Diagnosis not present

## 2023-08-30 DIAGNOSIS — K922 Gastrointestinal hemorrhage, unspecified: Secondary | ICD-10-CM | POA: Diagnosis not present

## 2023-08-30 DIAGNOSIS — Z681 Body mass index (BMI) 19 or less, adult: Secondary | ICD-10-CM | POA: Diagnosis not present

## 2023-08-30 DIAGNOSIS — C229 Malignant neoplasm of liver, not specified as primary or secondary: Secondary | ICD-10-CM | POA: Diagnosis not present

## 2023-08-30 NOTE — Telephone Encounter (Signed)
 [facility] Clinical Social Work  Clinical Social Work was referred by new patient protocol for New Patient Assessment.  Clinical Social Worker attempted to contact patient by phone to offer support and assess for needs.     Interventions: CSW Left Vm x1       Follow Up Plan:  CSW will attempt to reach again.    Lizbeth Sprague, LCSW  Clinical Social Worker [facility]

## 2023-08-30 NOTE — Telephone Encounter (Signed)
 Spoke with patient confirming upcoming appointment

## 2023-08-31 ENCOUNTER — Other Ambulatory Visit: Payer: Self-pay

## 2023-08-31 ENCOUNTER — Encounter: Admitting: Internal Medicine

## 2023-08-31 DIAGNOSIS — K5090 Crohn's disease, unspecified, without complications: Secondary | ICD-10-CM | POA: Diagnosis not present

## 2023-09-01 ENCOUNTER — Other Ambulatory Visit: Payer: Self-pay

## 2023-09-01 DIAGNOSIS — C799 Secondary malignant neoplasm of unspecified site: Secondary | ICD-10-CM

## 2023-09-02 NOTE — Progress Notes (Signed)
 The proposed treatment discussed in conference is for discussion purpose only and is not a binding recommendation.  The patients have not been physically examined, or presented with their treatment options.  Therefore, final treatment plans cannot be decided.

## 2023-09-06 ENCOUNTER — Encounter: Payer: Self-pay | Admitting: Internal Medicine

## 2023-09-08 ENCOUNTER — Telehealth: Payer: Self-pay | Admitting: Internal Medicine

## 2023-09-08 ENCOUNTER — Telehealth: Payer: Self-pay | Admitting: Oncology

## 2023-09-08 DIAGNOSIS — F411 Generalized anxiety disorder: Secondary | ICD-10-CM | POA: Diagnosis not present

## 2023-09-08 DIAGNOSIS — F330 Major depressive disorder, recurrent, mild: Secondary | ICD-10-CM | POA: Diagnosis not present

## 2023-09-08 MED ORDER — FIDAXOMICIN 200 MG PO TABS: 200.0000 mg | ORAL_TABLET | Freq: Two times a day (BID) | ORAL | 0 refills | Status: DC

## 2023-09-08 NOTE — Telephone Encounter (Signed)
 Patient message through MyChart having recurrent diarrhea that reminds him of C. difficile.  His Crohn's disease has been under control.  He has been treated twice with vancomycin  for C. difficile.  Will treat with Dificid  200 mg twice daily x 10 days.

## 2023-09-08 NOTE — Telephone Encounter (Signed)
 Spoke with patient confirming upcoming appointment changes

## 2023-09-09 ENCOUNTER — Inpatient Hospital Stay

## 2023-09-09 ENCOUNTER — Encounter (HOSPITAL_COMMUNITY)
Admission: RE | Admit: 2023-09-09 | Discharge: 2023-09-09 | Disposition: A | Discharge status: Home / Self Care | Source: Ambulatory Visit | Attending: Oncology | Admitting: Oncology

## 2023-09-09 DIAGNOSIS — R970 Elevated carcinoembryonic antigen [CEA]: Secondary | ICD-10-CM | POA: Insufficient documentation

## 2023-09-09 DIAGNOSIS — Z87891 Personal history of nicotine dependence: Secondary | ICD-10-CM | POA: Insufficient documentation

## 2023-09-09 DIAGNOSIS — Z5111 Encounter for antineoplastic chemotherapy: Secondary | ICD-10-CM | POA: Insufficient documentation

## 2023-09-09 DIAGNOSIS — R933 Abnormal findings on diagnostic imaging of other parts of digestive tract: Secondary | ICD-10-CM | POA: Diagnosis not present

## 2023-09-09 DIAGNOSIS — C221 Intrahepatic bile duct carcinoma: Secondary | ICD-10-CM | POA: Diagnosis not present

## 2023-09-09 DIAGNOSIS — Z79899 Other long term (current) drug therapy: Secondary | ICD-10-CM | POA: Insufficient documentation

## 2023-09-09 DIAGNOSIS — R932 Abnormal findings on diagnostic imaging of liver and biliary tract: Secondary | ICD-10-CM | POA: Diagnosis not present

## 2023-09-09 DIAGNOSIS — C799 Secondary malignant neoplasm of unspecified site: Secondary | ICD-10-CM

## 2023-09-09 LAB — CBC WITH DIFFERENTIAL (CANCER CENTER ONLY)
Abs Immature Granulocytes: 0.26 K/uL — ABNORMAL HIGH (ref 0.00–0.07)
Basophils Absolute: 0 K/uL (ref 0.0–0.1)
Basophils Relative: 1 %
Eosinophils Absolute: 0 K/uL (ref 0.0–0.5)
Eosinophils Relative: 0 %
HCT: 37.6 % — ABNORMAL LOW (ref 39.0–52.0)
Hemoglobin: 12.4 g/dL — ABNORMAL LOW (ref 13.0–17.0)
Immature Granulocytes: 4 %
Lymphocytes Relative: 7 %
Lymphs Abs: 0.5 K/uL — ABNORMAL LOW (ref 0.7–4.0)
MCH: 30.8 pg (ref 26.0–34.0)
MCHC: 33 g/dL (ref 30.0–36.0)
MCV: 93.5 fL (ref 80.0–100.0)
Monocytes Absolute: 0.4 K/uL (ref 0.1–1.0)
Monocytes Relative: 6 %
Neutro Abs: 5.3 K/uL (ref 1.7–7.7)
Neutrophils Relative %: 82 %
Platelet Count: 183 K/uL (ref 150–400)
RBC: 4.02 MIL/uL — ABNORMAL LOW (ref 4.22–5.81)
RDW: 16.1 % — ABNORMAL HIGH (ref 11.5–15.5)
WBC Count: 6.5 K/uL (ref 4.0–10.5)
nRBC: 0 % (ref 0.0–0.2)

## 2023-09-09 LAB — CMP (CANCER CENTER ONLY)
ALT: 175 U/L — ABNORMAL HIGH (ref 0–44)
AST: 95 U/L — ABNORMAL HIGH (ref 15–41)
Albumin: 3.4 g/dL — ABNORMAL LOW (ref 3.5–5.0)
Alkaline Phosphatase: 233 U/L — ABNORMAL HIGH (ref 38–126)
Anion gap: 6 (ref 5–15)
BUN: 18 mg/dL (ref 6–20)
CO2: 30 mmol/L (ref 22–32)
Calcium: 9.3 mg/dL (ref 8.9–10.3)
Chloride: 104 mmol/L (ref 98–111)
Creatinine: 0.96 mg/dL (ref 0.61–1.24)
GFR, Estimated: >60 mL/min (ref >60)
Glucose, Bld: 133 mg/dL — ABNORMAL HIGH (ref 70–99)
Potassium: 4.7 mmol/L (ref 3.5–5.1)
Sodium: 140 mmol/L (ref 135–145)
Total Bilirubin: 0.4 mg/dL (ref 0.0–1.2)
Total Protein: 5.9 g/dL — ABNORMAL LOW (ref 6.5–8.1)

## 2023-09-09 LAB — MISCELLANEOUS TEST

## 2023-09-09 LAB — GLUCOSE, CAPILLARY: Glucose-Capillary: 124 mg/dL — ABNORMAL HIGH (ref 70–99)

## 2023-09-09 LAB — CEA (ACCESS): CEA (CHCC): 98.3 ng/mL — ABNORMAL HIGH (ref 0.00–5.00)

## 2023-09-09 MED ORDER — FLUDEOXYGLUCOSE F - 18 (FDG) INJECTION
7.5400 | Freq: Once | INTRAVENOUS | Status: AC | PRN
  Administered 2023-09-09: 7.54 via INTRAVENOUS

## 2023-09-10 LAB — CANCER ANTIGEN 19-9: CA 19-9: 2160 U/mL — ABNORMAL HIGH (ref 0–35)

## 2023-09-12 ENCOUNTER — Other Ambulatory Visit: Payer: Self-pay | Admitting: Radiology

## 2023-09-12 ENCOUNTER — Encounter: Payer: Self-pay | Admitting: Internal Medicine

## 2023-09-12 DIAGNOSIS — C44712 Basal cell carcinoma of skin of right lower limb, including hip: Secondary | ICD-10-CM | POA: Diagnosis not present

## 2023-09-12 DIAGNOSIS — D225 Melanocytic nevi of trunk: Secondary | ICD-10-CM | POA: Diagnosis not present

## 2023-09-12 DIAGNOSIS — Z85828 Personal history of other malignant neoplasm of skin: Secondary | ICD-10-CM | POA: Diagnosis not present

## 2023-09-12 DIAGNOSIS — L570 Actinic keratosis: Secondary | ICD-10-CM | POA: Diagnosis not present

## 2023-09-12 DIAGNOSIS — C44619 Basal cell carcinoma of skin of left upper limb, including shoulder: Secondary | ICD-10-CM | POA: Diagnosis not present

## 2023-09-12 DIAGNOSIS — L565 Disseminated superficial actinic porokeratosis (DSAP): Secondary | ICD-10-CM | POA: Diagnosis not present

## 2023-09-12 DIAGNOSIS — M9903 Segmental and somatic dysfunction of lumbar region: Secondary | ICD-10-CM | POA: Diagnosis not present

## 2023-09-12 DIAGNOSIS — L821 Other seborrheic keratosis: Secondary | ICD-10-CM | POA: Diagnosis not present

## 2023-09-12 NOTE — H&P (Signed)
 Chief Complaint: Newly diagnosed intrahepatic cholangiocarcinoma; referred for Port-A-Cath placement to assist with treatment  Referring Provider(s): Pasam,A  Supervising Physician: Johann Sieving  Patient Status: [name] - Out-pt  History of Present Illness: [name] is a 57 y.o. male with past medical history significant for anal fistula, anxiety, arthritis, autoimmune hepatitis, skin cancer, chronic mesenteric ischemia, chronic pain syndrome, Chron's disease, Grovers disease, PTSD, prior C. difficile colitis, alcohol abuse, prior substance abuse, anemia, depression, portal vein thrombosis, steroid-induced diabetes, primary sclerosing cholangitis who presents now with newly diagnosed intrahepatic cholangiocarcinoma.  He is scheduled today for Port-A-Cath placement to assist with treatment.  *** Patient is Full Code  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 06/15/2018   Cancer of skin of neck    Cataract    Chronic mesenteric ischemia ([name])    Chronic pain syndrome 07/09/2016   Crohn's disease of small and large intestines ([name])    followed by dr lupita gessner   Dairy product intolerance    Diarrhea, functional    Family history of adverse reaction to anesthesia    Grover's disease    transient acantholytic dermatosis   History of alcohol abuse    History of basal cell carcinoma excision    2013 left leg   History of Clostridium difficile    10/ 2014   History of multiple concussions    x6   last one Jan 2017 per pt--  no residual   History of substance abuse ([name])    quit 1997 per pt   History of suicide attempt    05-18-2012  overdose /  failure to thrive   Iron  deficiency anemia due to chronic blood loss    Major depression,  recurrent, chronic ([name])    Osteopenia    Personal history of adenomatous colonic polyps 12/2010, 03/2012   12/2010 - 8 mm serrated adenoma of rectum   Portal vein thrombosis 03/21/2015   right   Primary sclerosing cholangitis    ? 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   Substance abuse ([name]) 1997   Alcohol   Vitamin A  deficiency 05/23/2018   Vitamin B6 deficiency 07/27/2018   Vitamin C deficiency 05/23/2018    Past Surgical History:  Procedure Laterality Date   ABDOMINAL AORTAGRAM N/A 03/29/2012   Procedure: ABDOMINAL EZELLA;  Surgeon: Gaile LELON New, MD;  Location: [name] CATH LAB;  Service: Cardiovascular;  Laterality: N/A;   ADENOIDECTOMY  age 65   BIOPSY  05/31/2018   Procedure: BIOPSY;  Surgeon: Teressa Sieving SQUIBB, MD;  [facility];  Service: Endoscopy;;   CATARACT EXTRACTION W/ INTRAOCULAR LENS  IMPLANT, BILATERAL  2009   COLONOSCOPY  2001, 05/02/2003, 01/28/11   2012: Right colon Crohn's, rectal polyp   COLONOSCOPY  03/31/2012   Procedure: COLONOSCOPY;  Surgeon: Gordy CHRISTELLA Starch, MD;  Location: [facility] ENDOSCOPY;  Service: Gastroenterology;  Laterality: N/A;   COLONOSCOPY N/A 08/18/2023   Procedure: COLONOSCOPY;  Surgeon: avram lupita BRAVO, MD;  [facility];  Service: Gastroenterology;  Laterality: N/A;   COLONOSCOPY N/A 08/22/2023   Procedure: COLONOSCOPY;  Surgeon: Federico Rosario BROCKS, MD;  [facility]OS[facility];  Service: Gastroenterology;  Laterality: N/A;   COLONOSCOPY WITH PROPOFOL  N/A 05/31/2018   Procedure: COLONOSCOPY WITH PROPOFOL ;  Surgeon: Teressa Toribio SQUIBB, MD;  [facility];  Service: Endoscopy;  Laterality: N/A;   ESOPHAGOGASTRODUODENOSCOPY  01/28/2011   Normal   ESOPHAGOGASTRODUODENOSCOPY N/A 08/18/2023   Procedure: EGD (ESOPHAGOGASTRODUODENOSCOPY);  Surgeon: Avram Lupita BRAVO, MD;  Location: [facility];  Service: Gastroenterology;  Laterality: N/A;   FOOT SURGERY Right age 70    MOHS SURGERY Left 11/2013   left ankle parakerotosis    PERCUTANEOUS LIVER BIOPSY  2007 and 2008   PILONIDAL CYST EXCISION  age 63   PLACEMENT OF SETON N/A 11/06/2015   Procedure: PLACEMENT OF SETON;  Surgeon: Bernarda Ned, MD;  Location: [facility];  Service: General;  Laterality: N/A;   PLACEMENT OF SETON  07/2018   at [facility]   RECTAL EXAM UNDER ANESTHESIA N/A 01/18/2019   Procedure: ANAL EXAM UNDER ANESTHESIA,  INCISION AND DRAINAGE, SETON PLACEMENT;  Surgeon: Ned Bernarda, MD;  Location: [facility] [place];  Service: General;  Laterality: N/A;   UPPER GASTROINTESTINAL ENDOSCOPY      Allergies: Tape, Asacol  [mesalamine ], Azathioprine, Gluten meal, Metoprolol  tartrate, Mycophenolate mofetil, Other, Wheat, Abilify  [aripiprazole ], and Amoxicillin   Medications: Prior to Admission medications   Medication Sig Start Date End Date Taking? Authorizing Provider  fidaxomicin  (DIFICID ) 200 MG TABS tablet Take 1 tablet (200 mg total) by mouth 2 (two) times daily. 09/08/23   Avram Lupita BRAVO, MD  hydrOXYzine  (ATARAX ) 25 MG tablet Take 1 tablet (25 mg total) by mouth 3 (three) times daily as needed for anxiety. 05/19/23   Victoria Ruts, MD  mirtazapine  (REMERON ) 30 MG tablet Take 0.5 tablets (15 mg total) by mouth at bedtime. 07/05/23   Cheryle Page, MD  Nutritional Supplements (ENSURE HIGH PROTEIN) LIQD Take 237 mLs by mouth in the morning and at bedtime.    [provider]  predniSONE  (DELTASONE ) 20 MG tablet TAKE 1.5 TABLETS (30 MG TOTAL) BY MOUTH DAILY WITH BREAKFAST. Patient taking differently: Take 30 mg by mouth daily with breakfast. Patient reports he takes 20 mg daily not 30mg . 06/22/23   Avram Lupita BRAVO, MD  Vedolizumab  (ENTYVIO  IV) Inject into the vein every 6 (six) weeks.    [provider]  venlafaxine  XR (EFFEXOR -XR) 150 MG 24 hr capsule Take 1 capsule (150 mg total) by mouth daily with breakfast. 05/20/23   Victoria Ruts, MD   venlafaxine  XR (EFFEXOR -XR) 75 MG 24 hr capsule Take 75 mg by mouth daily.    [provider]     Family History  Problem Relation Age of Onset   Heart disease Father    Thyroid  cancer Father    Allergies Father    Clotting disorder Father    Breast cancer Mother    Stomach cancer Mother    Colon cancer Neg Hx    Esophageal cancer Neg Hx    Rectal cancer Neg Hx     Social History   Socioeconomic History   Marital status: Single    Spouse name: Not on file   Number of children: 1   Years of education: Not on file   Highest education level: Not on file  Occupational History   Occupation: Disability   Tobacco Use   Smoking status: Former    Current packs/day: 0.00    Average packs/day: 1 pack/day for 15.0 years (15.0 ttl pk-yrs)    Types: Cigarettes    Start date: 03/28/1982  Quit date: 03/28/1997    Years since quitting: 26.4   Smokeless tobacco: Never  Vaping Use   Vaping status: Never Used  Substance and Sexual Activity   Alcohol use: No    Alcohol/week: 0.0 standard drinks of alcohol   Drug use: Not Currently    Types: Cocaine, Marijuana    Comment: QUIT USING DRUGS IN 1997   Sexual activity: Not Currently  Other Topics Concern   Not on file  Social History Narrative   Single, history of substance abuse in recovery   Previously occupied on medical disability   1 child  Daughter Wellsite geologist, lives and works in [place]   Elderly parents involved and he helps them   1 brother   Social Drivers of Corporate investment banker Strain: Low Risk  (08/28/2019)   Overall Financial Resource Strain (CARDIA)    Difficulty of Paying Living Expenses: Not hard at all  Food Insecurity: No Food Insecurity (08/26/2023)   Hunger Vital Sign    Worried About Running Out of Food in the Last Year: Never true    Ran Out of Food in the Last Year: Never true  Transportation Needs: No Transportation Needs (08/26/2023)   PRAPARE - Administrator, Civil Service  (Medical): No    Lack of Transportation (Non-Medical): No  Physical Activity: Insufficiently Active (11/08/2019)   Exercise Vital Sign    Days of Exercise per Week: 3 days    Minutes of Exercise per Session: 30 min  Stress: Stress Concern Present (08/28/2019)   [employer] of Occupational Health - Occupational Stress Questionnaire    Feeling of Stress : Very much  Social Connections: Socially Isolated (07/02/2023)   Social Connection and Isolation Panel    Frequency of Communication with Friends and Family: Once a week    Frequency of Social Gatherings with Friends and Family: Once a week    Attends Religious Services: Never    Database administrator or Organizations: No    Attends Engineer, structural: Never    Marital Status: Never married       Review of Systems  Vital Signs:   Advance Care Plan: No documents on file  Physical Exam  Imaging: CT ABDOMEN PELVIS W CONTRAST Result Date: 08/20/2023 CLINICAL DATA:  New onset bloody stool. History of Crohn's disease. Recent ultrasound-guided liver biopsy. Well differentiated adenocarcinoma on liver biopsy. * Tracking Code: BO *. EXAM: CT ABDOMEN AND PELVIS WITH CONTRAST TECHNIQUE: Multidetector CT imaging of the abdomen and pelvis was performed using the standard protocol following bolus administration of intravenous contrast. RADIATION DOSE REDUCTION: This exam was performed according to the departmental dose-optimization program which includes automated exposure control, adjustment of the mA and/or kV according to patient size and/or use of iterative reconstruction technique. CONTRAST:  OMNIPAQUE  IOHEXOL  300 MG/ML  SOLN COMPARISON:  CT 07/02/2023, MRI 07/03/2023 FINDINGS: Lower chest: No pulmonary nodules the lung bases. Hepatobiliary: Lesion lateral margin of the RIGHT hepatic lobe is increased in the interval from prior CT measuring 4.6 x 3.6 cm compared to 3.7 x 2.8 cm. A small cystic collection adjacent to the  expanding mass is not changed measuring 1.3 cm compared to 1.3 cm. Additionally cystic lesion inferior to the hepatic mass measures 4. 5 cm compared to 4.7 cm for no change. Gallbladder unchanged. Several small cystic lesions in the LEFT hepatic lobe unchanged. Small low-density lesions centrally in liver measuring 7 mm on image 13/2 is indeterminate. Similar lesion in  the LEFT hepatic lobe more lateral measuring 6 mm image 18 indeterminate. These lesions are not well appreciated on comparison MRI. Comparison CT with noncontrast. Pancreas: Pancreas is normal. No ductal dilatation. No pancreatic inflammation. Spleen: Normal spleen Adrenals/urinary tract: Adrenal glands and kidneys are normal. The ureters and bladder normal. Stomach/Bowel: Small diverticulum posterior to the gastric fundus. Stomach, duodenum and small-bowel normal. Ascending transverse and descending colon appear normal. There is fluid in the rectum. Vascular/Lymphatic: Abdominal aorta is normal caliber with atherosclerotic calcification. There is no retroperitoneal or periportal lymphadenopathy. No pelvic lymphadenopathy. Reproductive: Prostate unremarkable Other: No free fluid. Musculoskeletal: No aggressive osseous lesion. IMPRESSION: 1. Enlarging mass in the RIGHT hepatic lobe consistent with biopsy-proven adenocarcinoma. 2. Two small hypodense lesions in LEFT hepatic lobe are indeterminate 3. No evidence of bowel inflammation. No stenosis or fistula. Fluid in rectum. Electronically Signed   By: Jackquline Boxer M.D.   On: 08/20/2023 11:53    Labs:  CBC: Recent Labs    08/21/23 0544 08/22/23 0436 08/23/23 0428 09/09/23 1359  WBC 13.8* 8.4 11.0* 6.5  HGB 13.7 11.6* 11.8* 12.4*  HCT 40.1 34.9* 36.6* 37.6*  PLT 123* 157 143* 183    COAGS: Recent Labs    11/11/22 1340 07/04/23 1634 07/29/23 1138 08/20/23 1713  INR 1.3* 1.0 1.0 1.0    BMP: Recent Labs    07/26/23 1059 08/15/23 1223 08/20/23 1015 08/21/23 0811  08/23/23 0428 09/09/23 1359  NA 135 140 138 138  --  140  K 3.7 4.0 3.8 3.3* 3.3* 4.7  CL 101 103 103 103  --  104  CO2 25 29 30 26   --  30  GLUCOSE 104* 145* 108* 96  --  133*  BUN 20 24* 24* 19  --  18  CALCIUM 7.9* 9.0 8.4* 8.4*  --  9.3  CREATININE 1.38* 1.16 0.97 1.15  --  0.96  GFRNONAA 59*  --  >60 >60  --  >60    LIVER FUNCTION TESTS: Recent Labs    07/26/23 1059 08/15/23 1223 08/21/23 0811 09/09/23 1359  BILITOT 1.0 0.4 0.8 0.4  AST 39 43* 49* 95*  ALT 88* 80* 67* 175*  ALKPHOS 117 158* 128* 233*  PROT 5.3* 5.6* 5.1* 5.9*  ALBUMIN 2.6* 3.6 2.7* 3.4*    TUMOR MARKERS: Recent Labs    09/09/23 1359  CEA 98.30*    Assessment and Plan: 58 y.o. male with past medical history significant for anal fistula, anxiety, arthritis, autoimmune hepatitis, skin cancer, chronic mesenteric ischemia, chronic pain syndrome, Chron's disease, Grovers disease, PTSD, prior C. difficile colitis, alcohol abuse, prior substance abuse, anemia, depression, portal vein thrombosis, steroid-induced diabetes, primary sclerosing cholangitis who presents now with newly diagnosed intrahepatic cholangiocarcinoma.  He is scheduled today for Port-A-Cath placement to assist with treatment.Risks and benefits of image guided port-a-catheter placement was discussed with the patient including, but not limited to bleeding, infection, pneumothorax, or fibrin sheath development and need for additional procedures.  All of the patient's questions were answered, patient is agreeable to proceed. Consent signed and in chart.  Patient known to IR team from random liver biopsies in 2007, 2008, 2014, 2021 and right lobe liver lesion biopsy on 07/29/2023   Thank you for allowing our service to participate in [name] 's care.  Electronically Signed: D. Franky Rakers, PA-C   09/12/2023, 3:46 PM      I spent a total of    15 Minutes in face to face in clinical consultation,  greater than 50% of which was  counseling/coordinating care for Port-A-Cath placement

## 2023-09-13 ENCOUNTER — Telehealth: Payer: Self-pay | Admitting: Dietician

## 2023-09-13 ENCOUNTER — Inpatient Hospital Stay: Admitting: Dietician

## 2023-09-13 NOTE — Telephone Encounter (Signed)
 Nutrition Assessment   Reason for Assessment: Referral    ASSESSMENT: 57 year old male with newly diagnosed intrahepatic cholangiocarcinoma metastatic to liver. He is pending start of chemotherapy with cisplatin + gemcitabine. Patient is under the care of Dr. Autumn  Past medical history significant for primary sclerosing cholangitis, autoimmune hepatitis, Crohn's disease, chronic mesenteric ischemia, C diff, portal vein thrombosis, basal cell carcinoma of LLE, chronic pain syndrome, PTSD, MDD, SI  Spoke with pt via telephone. Introduced self as member of care team. Patient appreciative of call. He reports most recent C diff infection resolved on its own. Patient did not require dificid  prescription. He has been having formed stools x3 days. Patient endorses good appetite with 10 lb wt gain since hospital discharge. Goal weight is 175 lb. Patient eating small frequent meals. This is something he has done for the years due to GI history. He avoids dairy, nuts, sugar, raw vegetables, and foods high in fat. Patient was able to tolerate Ensure during admission and currently adding this to cheerios in the morning. Patient has limited variety of intake. Recalls mostly eating greek yogurt, rice, potatoes, deli meat, [employer] bread, chicken. Patient does take MVI. He drinks one gallon of water as well as some tea.   Nutrition Focused Physical Exam: unable to complete (telephone)   Medications: hydroxyzine , remeron , prednisone , effexor    Labs: 7/11 - glucose 133, albumin 3.4   Anthropometrics: Endorses 10 lb wt gain on home scale since last recorded wt  Height: 6'2 Weight: 151 lb 4.8 oz (6/27) UBW: 181-186 (2024) BMI: 19.43   NUTRITION DIAGNOSIS: Unintended wt loss related to chronic illnesses as evidenced by 7% wt loss in 7 weeks which is significant. Per chart pt 163 lb 12.8 oz on 5/4   INTERVENTION:  Continue eating small frequent meals/snacks. Recommend protein foods at every  meal Continue drinking Ensure Plus/equivalent - will provide samples + coupons Contact information provided    MONITORING, EVALUATION, GOAL: Pt will tolerate adequate calories and protein to promote stable wt through out treatment    Next Visit: To be scheduled with treatment plan

## 2023-09-14 ENCOUNTER — Other Ambulatory Visit: Payer: Self-pay

## 2023-09-14 ENCOUNTER — Encounter (HOSPITAL_COMMUNITY): Payer: Self-pay

## 2023-09-14 ENCOUNTER — Ambulatory Visit (HOSPITAL_COMMUNITY)
Admission: RE | Admit: 2023-09-14 | Discharge: 2023-09-14 | Disposition: A | Discharge status: Home / Self Care | Source: Ambulatory Visit | Attending: Oncology

## 2023-09-14 ENCOUNTER — Encounter: Payer: Self-pay | Admitting: Oncology

## 2023-09-14 ENCOUNTER — Ambulatory Visit (HOSPITAL_COMMUNITY)
Admission: RE | Admit: 2023-09-14 | Discharge: 2023-09-14 | Disposition: A | Discharge status: Home / Self Care | Source: Ambulatory Visit | Attending: Oncology | Admitting: Oncology

## 2023-09-14 DIAGNOSIS — F419 Anxiety disorder, unspecified: Secondary | ICD-10-CM | POA: Diagnosis not present

## 2023-09-14 DIAGNOSIS — C799 Secondary malignant neoplasm of unspecified site: Secondary | ICD-10-CM

## 2023-09-14 DIAGNOSIS — Z7952 Long term (current) use of systemic steroids: Secondary | ICD-10-CM | POA: Insufficient documentation

## 2023-09-14 DIAGNOSIS — Z87891 Personal history of nicotine dependence: Secondary | ICD-10-CM | POA: Diagnosis not present

## 2023-09-14 DIAGNOSIS — E099 Drug or chemical induced diabetes mellitus without complications: Secondary | ICD-10-CM | POA: Insufficient documentation

## 2023-09-14 DIAGNOSIS — C221 Intrahepatic bile duct carcinoma: Secondary | ICD-10-CM | POA: Insufficient documentation

## 2023-09-14 DIAGNOSIS — Z85828 Personal history of other malignant neoplasm of skin: Secondary | ICD-10-CM | POA: Insufficient documentation

## 2023-09-14 DIAGNOSIS — G894 Chronic pain syndrome: Secondary | ICD-10-CM | POA: Insufficient documentation

## 2023-09-14 DIAGNOSIS — K5080 Crohn's disease of both small and large intestine without complications: Secondary | ICD-10-CM | POA: Insufficient documentation

## 2023-09-14 DIAGNOSIS — F1011 Alcohol abuse, in remission: Secondary | ICD-10-CM | POA: Diagnosis not present

## 2023-09-14 DIAGNOSIS — F4310 Post-traumatic stress disorder, unspecified: Secondary | ICD-10-CM | POA: Insufficient documentation

## 2023-09-14 HISTORY — PX: IR IMAGING GUIDED PORT INSERTION: IMG5740

## 2023-09-14 MED ORDER — FENTANYL CITRATE (PF) 100 MCG/2ML IJ SOLN
INTRAMUSCULAR | Status: AC | PRN
  Administered 2023-09-14: 50 ug via INTRAVENOUS

## 2023-09-14 MED ORDER — LIDOCAINE-EPINEPHRINE 1 %-1:100000 IJ SOLN
INTRAMUSCULAR | Status: AC
  Filled 2023-09-14: qty 1

## 2023-09-14 MED ORDER — LIDOCAINE-EPINEPHRINE 1 %-1:100000 IJ SOLN
20.0000 mL | Freq: Once | INTRAMUSCULAR | Status: AC
  Administered 2023-09-14: 20 mL via INTRADERMAL

## 2023-09-14 MED ORDER — FENTANYL CITRATE (PF) 100 MCG/2ML IJ SOLN
INTRAMUSCULAR | Status: AC
  Filled 2023-09-14: qty 2

## 2023-09-14 MED ORDER — HEPARIN SOD (PORK) LOCK FLUSH 100 UNIT/ML IV SOLN
500.0000 [IU] | Freq: Once | INTRAVENOUS | Status: AC
  Administered 2023-09-14: 500 [IU] via INTRAVENOUS

## 2023-09-14 MED ORDER — MIDAZOLAM HCL 2 MG/2ML IJ SOLN
INTRAMUSCULAR | Status: AC
  Filled 2023-09-14: qty 2

## 2023-09-14 MED ORDER — HEPARIN SOD (PORK) LOCK FLUSH 100 UNIT/ML IV SOLN
INTRAVENOUS | Status: AC
  Filled 2023-09-14: qty 5

## 2023-09-14 MED ORDER — MIDAZOLAM HCL 2 MG/2ML IJ SOLN
INTRAMUSCULAR | Status: AC | PRN
  Administered 2023-09-14: 1 mg via INTRAVENOUS

## 2023-09-14 MED ORDER — SODIUM CHLORIDE 0.9 % IV SOLN: INTRAVENOUS | Status: DC

## 2023-09-14 MED ORDER — MIDAZOLAM HCL 2 MG/2ML IJ SOLN
INTRAMUSCULAR | Status: AC | PRN
Start: 2023-09-14 — End: 2023-09-14
  Administered 2023-09-14: 1 mg via INTRAVENOUS

## 2023-09-14 NOTE — Sedation Documentation (Signed)
 RN Ethelmae Ringel pulled 2mg  Versed  and 100mcg Fentanyl  in Ir room pysix. Pt. Received 2mg  Versed  and 100mcg Fentanyl  throughout the procedure.

## 2023-09-14 NOTE — Progress Notes (Signed)
 Ice bag given to use as needed for comfort to right upper neck and right upper chest.

## 2023-09-14 NOTE — Discharge Instructions (Signed)

## 2023-09-14 NOTE — Procedures (Signed)
  Procedure:  R internal jugular port catheter placement   Preprocedure diagnosis: The encounter diagnosis was Metastatic adenocarcinoma ([name]). Postprocedure diagnosis: same EBL:    minimal Complications:   none immediate  See full dictation in [employer].  CHARM Toribio Faes MD Main # [phone] Pager  [phone] Mobile [phone]

## 2023-09-15 ENCOUNTER — Encounter: Payer: Self-pay | Admitting: Oncology

## 2023-09-15 ENCOUNTER — Inpatient Hospital Stay (HOSPITAL_BASED_OUTPATIENT_CLINIC_OR_DEPARTMENT_OTHER): Admitting: Oncology

## 2023-09-15 ENCOUNTER — Other Ambulatory Visit: Payer: Self-pay | Admitting: Oncology

## 2023-09-15 ENCOUNTER — Inpatient Hospital Stay

## 2023-09-15 VITALS — BP 130/86 | HR 91 | Temp 97.8°F | Resp 17 | Wt 158.2 lb

## 2023-09-15 DIAGNOSIS — C221 Intrahepatic bile duct carcinoma: Secondary | ICD-10-CM

## 2023-09-15 DIAGNOSIS — R932 Abnormal findings on diagnostic imaging of liver and biliary tract: Secondary | ICD-10-CM | POA: Diagnosis not present

## 2023-09-15 DIAGNOSIS — K754 Autoimmune hepatitis: Secondary | ICD-10-CM | POA: Diagnosis not present

## 2023-09-15 DIAGNOSIS — A0472 Enterocolitis due to Clostridium difficile, not specified as recurrent: Secondary | ICD-10-CM

## 2023-09-15 DIAGNOSIS — K50813 Crohn's disease of both small and large intestine with fistula: Secondary | ICD-10-CM

## 2023-09-15 DIAGNOSIS — K603 Anal fistula, unspecified: Secondary | ICD-10-CM | POA: Diagnosis not present

## 2023-09-15 DIAGNOSIS — R970 Elevated carcinoembryonic antigen [CEA]: Secondary | ICD-10-CM | POA: Diagnosis not present

## 2023-09-15 DIAGNOSIS — R933 Abnormal findings on diagnostic imaging of other parts of digestive tract: Secondary | ICD-10-CM | POA: Diagnosis not present

## 2023-09-15 LAB — CBC WITH DIFFERENTIAL (CANCER CENTER ONLY)
Abs Immature Granulocytes: 0.67 K/uL — ABNORMAL HIGH (ref 0.00–0.07)
Basophils Absolute: 0.1 K/uL (ref 0.0–0.1)
Basophils Relative: 1 %
Eosinophils Absolute: 0 K/uL (ref 0.0–0.5)
Eosinophils Relative: 0 %
HCT: 37.2 % — ABNORMAL LOW (ref 39.0–52.0)
Hemoglobin: 12.2 g/dL — ABNORMAL LOW (ref 13.0–17.0)
Immature Granulocytes: 5 %
Lymphocytes Relative: 4 %
Lymphs Abs: 0.5 K/uL — ABNORMAL LOW (ref 0.7–4.0)
MCH: 30.7 pg (ref 26.0–34.0)
MCHC: 32.8 g/dL (ref 30.0–36.0)
MCV: 93.5 fL (ref 80.0–100.0)
Monocytes Absolute: 0.5 K/uL (ref 0.1–1.0)
Monocytes Relative: 4 %
Neutro Abs: 11.6 K/uL — ABNORMAL HIGH (ref 1.7–7.7)
Neutrophils Relative %: 86 %
Platelet Count: 191 K/uL (ref 150–400)
RBC: 3.98 MIL/uL — ABNORMAL LOW (ref 4.22–5.81)
RDW: 15.8 % — ABNORMAL HIGH (ref 11.5–15.5)
WBC Count: 13.3 K/uL — ABNORMAL HIGH (ref 4.0–10.5)
nRBC: 0 % (ref 0.0–0.2)

## 2023-09-15 LAB — CMP (CANCER CENTER ONLY)
ALT: 126 U/L — ABNORMAL HIGH (ref 0–44)
AST: 76 U/L — ABNORMAL HIGH (ref 15–41)
Albumin: 3.2 g/dL — ABNORMAL LOW (ref 3.5–5.0)
Alkaline Phosphatase: 255 U/L — ABNORMAL HIGH (ref 38–126)
Anion gap: 5 (ref 5–15)
BUN: 20 mg/dL (ref 6–20)
CO2: 30 mmol/L (ref 22–32)
Calcium: 8.8 mg/dL — ABNORMAL LOW (ref 8.9–10.3)
Chloride: 104 mmol/L (ref 98–111)
Creatinine: 1.07 mg/dL (ref 0.61–1.24)
GFR, Estimated: >60 mL/min (ref >60)
Glucose, Bld: 100 mg/dL — ABNORMAL HIGH (ref 70–99)
Potassium: 4.1 mmol/L (ref 3.5–5.1)
Sodium: 139 mmol/L (ref 135–145)
Total Bilirubin: 0.4 mg/dL (ref 0.0–1.2)
Total Protein: 5.7 g/dL — ABNORMAL LOW (ref 6.5–8.1)

## 2023-09-15 LAB — IRON AND IRON BINDING CAPACITY (CC-WL,HP ONLY)
Iron: 23 ug/dL — ABNORMAL LOW (ref 45–182)
Saturation Ratios: 9 % — ABNORMAL LOW (ref 17.9–39.5)
TIBC: 260 ug/dL (ref 250–450)
UIBC: 237 ug/dL (ref 117–376)

## 2023-09-15 LAB — FERRITIN: Ferritin: 153 ng/mL (ref 24–336)

## 2023-09-15 MED ORDER — PROCHLORPERAZINE MALEATE 10 MG PO TABS
10.0000 mg | ORAL_TABLET | Freq: Four times a day (QID) | ORAL | 1 refills | Status: DC | PRN
Start: 2023-09-15 — End: 2023-12-14

## 2023-09-15 MED ORDER — DEXAMETHASONE 4 MG PO TABS: ORAL_TABLET | ORAL | 1 refills | Status: DC

## 2023-09-15 MED ORDER — LIDOCAINE-PRILOCAINE 2.5-2.5 % EX CREA: TOPICAL_CREAM | CUTANEOUS | 3 refills | Status: DC

## 2023-09-15 MED ORDER — ONDANSETRON HCL 8 MG PO TABS: 8.0000 mg | ORAL_TABLET | Freq: Three times a day (TID) | ORAL | 1 refills | Status: DC | PRN

## 2023-09-15 NOTE — Assessment & Plan Note (Signed)
 Recurrent anal fistula causing significant discomfort and pain. Scheduled for drainage at [facility] by Dr. Stacia PA Rosalynn. - Proceed with scheduled drainage of anal fistula at [facility].

## 2023-09-15 NOTE — Assessment & Plan Note (Signed)
 Intrahepatic cholangiocarcinoma, diagnosed following MRI and liver biopsy in May 2025.  He underwent ultrasound-guided biopsy of liver lesion on 07/29/2023.  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 CDX-2 positive, CK20  positive, CK7 positive, synaptophysin negative, chromogranin negative.  IHC profile suggested a gastrointestinal or pancreaticobiliary primary.  To look for a primary, patient underwent upper endoscopy and colonoscopy on 08/18/2023, by Dr. Avram.  EGD showed evidence of gastritis, single gastric polyp.  Otherwise unremarkable.  Colonoscopy showed several polyps in the transverse colon, sigmoid colon, descending colon.  Evidence of diverticulosis in the sigmoid colon.  Multiple biopsies were obtained.  Biopsies from the stomach, colon, cecum all showed benign findings.  No evidence of malignancy noted.  Clinical picture is consistent with intrahepatic cholangiocarcinoma, primarily located in the right lobe of the liver with disease limited to the liver area. A new small lesion on the dome of the left side of the liver noted on PET scan and also recent CT scan.  Too small to be biopsied.  Will have to monitor this on future scans.   His case was previously discussed in GI multidisciplinary tumor conference.  Dr. Dasie felt that he can be a surgical candidate following neoadjuvant chemotherapy.  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  Given history of autoimmune hepatitis, primary sclerosing cholangitis, he would not be a candidate for immunotherapy.  Hence plan would be to proceed with chemotherapy using cisplatin and gemcitabine.  Request previously submitted for NGS testing on the specimen and liquid biopsy.  - Pending clearance from Dr. Debby following drainage of pelvic/anal abscess, we will plan to  administer chemotherapy with cisplatin and gemcitabine once weekly for two weeks on, one week off, depending on tolerance.  He will have chemo education session next week.  -He has had chronic C. difficile infection and he thinks the infection may have come back.  He will provide stool sample for testing, hopefully tomorrow.  He does have fidaxomicin  available and started taking it.  Will have to ensure that he is free of symptoms before we can consider chemotherapy initiation.  - Monitor kidney function and ensure adequate hydration to prevent kidney dysfunction.  - Monitor blood counts, liver function, and for side effects such as nausea, fatigue, hearing loss, and neuropathy.  - Refer to Dr.Shelby Dasie for surgical evaluation post-chemotherapy.  - Monitor CA 19-9 and CEA levels monthly.  Discussed with the patient that I will be out of office starting next week and he will be seen by one of our providers during my absence.  I did discuss his case with Dr. Lanny for follow up during my absence.

## 2023-09-15 NOTE — Assessment & Plan Note (Signed)
 Autoimmune hepatitis diagnosed in 2012, managed with prednisone . Immunotherapy/checkpoint inhibitor therapy is contraindicated due to the risk of exacerbating liver issues.

## 2023-09-15 NOTE — Progress Notes (Signed)
 [facility]  ONCOLOGY CLINIC PROGRESS NOTE   Patient Care Team: [name], MD as PCP - General (Family Medicine) Avram Lupita BRAVO, MD as Attending Physician (Gastroenterology) Harvey Carlin BRAVO, MD (Inactive) as Attending Physician (Vascular Surgery) Ardis Evalene CROME, RN as Oncology Nurse Navigator Yasha Tibbett, Chinita, MD as Consulting Physician (Hematology and Oncology)  PATIENT NAME: [name]   MR#: [id number] DOB: 29-Mar-1965  Date of visit: 09/15/2023   ASSESSMENT & PLAN:   [name] is a 58 y.o.  gentleman with a past medical history of primary sclerosing cholangitis, autoimmune hepatitis, Crohn's disease, anxiety/depression, PTSD, past C. difficile colitis, chronic pain syndrome, chronic mesenteric ischemia, basal cell carcinoma of left lower extremity, history of suicide attempt, past portal vein thrombosis, was referred to our clinic in June 2025 for newly diagnosed intrahepatic cholangiocarcinoma  Intrahepatic cholangiocarcinoma ([name]) Intrahepatic cholangiocarcinoma, diagnosed following MRI and liver biopsy in May 2025.  He underwent ultrasound-guided biopsy of liver lesion on 07/29/2023.  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 CDX-2 positive, CK20  positive, CK7 positive, synaptophysin negative, chromogranin negative.  IHC profile suggested a gastrointestinal or pancreaticobiliary primary.  To look for a primary, patient underwent upper endoscopy and colonoscopy on 08/18/2023, by Dr. Avram.  EGD showed evidence of gastritis, single gastric polyp.  Otherwise unremarkable.  Colonoscopy showed several polyps in the transverse colon, sigmoid colon, descending colon.  Evidence of diverticulosis in the sigmoid colon.  Multiple biopsies were obtained.  Biopsies from the stomach, colon, cecum all showed benign findings.  No evidence of malignancy noted.  Clinical  picture is consistent with intrahepatic cholangiocarcinoma, primarily located in the right lobe of the liver with disease limited to the liver area. A new small lesion on the dome of the left side of the liver noted on PET scan and also recent CT scan.  Too small to be biopsied.  Will have to monitor this on future scans.   His case was previously discussed in GI multidisciplinary tumor conference.  Dr. Dasie felt that he can be a surgical candidate following neoadjuvant chemotherapy.  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  Given history of autoimmune hepatitis, primary sclerosing cholangitis, he would not be a candidate for immunotherapy.  Hence plan would be to proceed with chemotherapy using cisplatin and gemcitabine.  Request previously submitted for NGS testing on the specimen and liquid biopsy.  - Pending clearance from Dr. Debby following drainage of pelvic/anal abscess, we will plan to administer chemotherapy with cisplatin and gemcitabine once weekly for two weeks on, one week off, depending on tolerance.  He will have chemo education session next week.  -He has had chronic C. difficile infection and he thinks the infection may have come back.  He will provide stool sample for testing, hopefully tomorrow.  He does have fidaxomicin  available and started taking it.  Will have to ensure that he is free of symptoms before we can consider chemotherapy initiation.  - Monitor kidney function and ensure adequate hydration to prevent kidney dysfunction.  - Monitor blood counts, liver function, and for side effects such as nausea, fatigue, hearing loss, and neuropathy.  - Refer to Dr.Shelby Dasie for surgical evaluation post-chemotherapy.  - Monitor CA 19-9 and CEA levels monthly.  Discussed with the patient that I will be out of office starting next week and he will be seen by one of  our  providers during my absence.  I did discuss his case with Dr. Lanny for follow up during my absence.   Autoimmune hepatitis-PSC overlap Autoimmune hepatitis diagnosed in 2012, managed with prednisone . Immunotherapy/checkpoint inhibitor therapy is contraindicated due to the risk of exacerbating liver issues.  CROHN'S DISEASE, LARGE AND SMALL INTESTINES Crohn's disease managed with Entyvio . No current symptoms of concern such as blood in stools or black stools reported.  Clostridium difficile colitis Recurrent Clostridioides difficile infection, currently managed with Dificid  (fidaxomicin ) for a 10-day course. Previous treatment with vancomycin  was ineffective. Imodium  was used to manage diarrhea for urgent situations, but its use is discouraged due to the risk of exacerbating the infection. No fever, nausea, vomiting, or abdominal pain reported. - Continue Dificid  (fidaxomicin ) for 10 days. - Avoid Imodium  unless absolutely necessary. - Provide stool sample kit for reassessment if diarrhea persists.  Anal fistula Recurrent anal fistula causing significant discomfort and pain. Scheduled for drainage at [facility] by Dr. Stacia PA Rosalynn. - Proceed with scheduled drainage of anal fistula at [facility].    I reviewed lab results and outside records for this visit and discussed relevant results with the patient. Diagnosis, plan of care and treatment options were also discussed in detail with the patient. Opportunity provided to ask questions and answers provided to his apparent satisfaction. Provided instructions to call our clinic with any problems, questions or concerns prior to return visit. I recommended to continue follow-up with PCP and sub-specialists. He verbalized understanding and agreed with the plan.   NCCN guidelines have been consulted in the planning of this patient's care.  I spent a total of 40 minutes during this encounter with the patient including review of chart and  various tests results, discussions about plan of care and coordination of care plan.   Chinita Patten, MD  09/15/2023 7:34 PM   [facility] - A DEPT OF [facility] [address] [place] [address] Dept: [phone] Dept Fax: [phone]    CHIEF COMPLAINT/ REASON FOR VISIT:   Intrahepatic cholangiocarcinoma, diagnosed in June 2025.  Current Treatment: Plan for systemic treatment with cisplatin and gemcitabine, followed by surgical evaluation.  INTERVAL HISTORY:    Discussed the use of AI scribe software for clinical note transcription with the patient, who gave verbal consent to proceed.  History of Present Illness [name] is a 58 year old male with intrahepatic cholangiocarcinoma who presents with diarrhea and anal fistula.  He has been experiencing diarrhea for the past two days, with seven episodes occurring within a three-hour period. He took Imodium  to manage the symptoms due to an appointment. He is currently on Dificid , which he started yesterday, taking two pills a day for a ten-day course. He has a history of C. difficile infection and has been on vancomycin  for almost a month and a half, which was not effective in stopping the diarrhea. No fever, nausea, vomiting, or abdominal pain.  He has a history of intrahepatic cholangiocarcinoma. A recent PET scan showed a new small lesion on the dome of the left side of the liver. He also has a history of primary sclerosing cholangitis and Crohn's disease.  He has an anal fistula on the left cheek, which is larger and more painful than previous ones. He is scheduled to have it opened and drained tomorrow at [facility]. He experiences a lot of gas daily, which he attributes to his gastrointestinal issues.  He is currently taking potassium three times a week, which he adjusted from daily dosing after his levels were stable. He drinks a  gallon of water a day and is mindful of his fluid intake to manage his kidney health during chemotherapy. His liver function tests show stable bilirubin levels, with AST and ALT slightly improved from previous results.     I have reviewed the past medical history, past surgical history, social history and family history with the patient and they are unchanged from previous note.  HISTORY OF PRESENT ILLNESS:   ONCOLOGY HISTORY:   He has a history of autoimmune hepatitis, primary sclerosing cholangitis diagnosed in 2012, and Crohn's disease for which he is currently taking prednisone  and Entyvio .   He was admitted to the hospital on 07/02/2023 after he presented with complaints of multiple episodes of diarrhea, dizziness, near syncope.  CT scan of the abdomen and pelvis on 07/02/2023 showed new 3.7 x 2.8 cm noncystic lesion in the dome of the liver, which was not present on prior studies.  Given history of primary sclerosing cholangitis, there was concern for cholangiocarcinoma and MRI was recommended.   On 07/03/2023, MRI of the abdomen showed 4.3 x 2.8 x 3.7 cm lesion in the lateral aspect of the hepatic dome which showed heterogeneous enhancement after IV contrast administration, suspicious for neoplasm.  Cholangiocarcinoma was considered because of his history of primary sclerosing cholangitis.  Inferior and posterior to this lesion was a cyst measuring 5 x 4.5 cm, slightly increased in size compared to previous MRI.  Bile duct showed variable dilatation and stenosis.   Patient did not want to stay in the hospital for biopsy at that time.   He underwent ultrasound-guided biopsy of liver lesion on 07/29/2023.  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 CDX-2 positive, CK20 positive, CK7 positive, synaptophysin negative, chromogranin negative.  IHC profile suggested a gastrointestinal or pancreaticobiliary primary.   To look for a  primary, patient underwent upper endoscopy and colonoscopy on 08/18/2023, by Dr. Avram.  EGD showed evidence of gastritis, single gastric polyp.  Otherwise unremarkable.  Colonoscopy showed several polyps in the transverse colon, sigmoid colon, descending colon.  Evidence of diverticulosis in the sigmoid colon.  Multiple biopsies were obtained.  Biopsies from the stomach, colon, cecum all showed benign findings.  No evidence of malignancy noted.   Clinical picture is consistent with intrahepatic cholangiocarcinoma.   Given history of autoimmune hepatitis, primary sclerosing cholangitis, he would not be a candidate for immunotherapy.  Hence plan would be to proceed with chemotherapy using cisplatin and gemcitabine.   Request submitted for NGS testing on the specimen and liquid biopsy.   On 09/09/2023, staging PET scan showed marginal areas of uptake along the mass lesion in the posterior superior right hepatic lobe, corresponding to the area of known neoplasm.  There were small cystic areas elsewhere in the liver, new small low-density focus in the dome of left hepatic lobe.  No appreciable abnormal uptake.  No areas of abnormal uptake seen above the diaphragm.  Findings consistent with anal fistula noted.  His case was previously discussed in GI multidisciplinary tumor conference.  Dr. Dasie felt that he can be a surgical candidate following neoadjuvant chemotherapy.  Pending clearance from Dr. Debby following drainage of pelvic/anal abscess, we will plan to administer chemotherapy with cisplatin and gemcitabine once weekly for two weeks on, one week off, depending on tolerance.   Oncology History  Intrahepatic cholangiocarcinoma ([name])  08/18/2023 Initial Diagnosis   Cholangiocarcinoma metastatic to liver ([name])   08/26/2023 Cancer Staging   Staging form: Intrahepatic Bile Duct, AJCC 8th Edition - Clinical: Stage II (cT2, cN0, cM0) - Signed by Autumn Millman, MD on 09/15/2023   09/26/2023 -   Chemotherapy   Patient is on Treatment Plan : BILIARY TRACT Cisplatin + Gemcitabine D1,8 q21d         REVIEW OF SYSTEMS:   Review of Systems - Oncology  All other pertinent systems were reviewed with the patient and are negative.  ALLERGIES: He is allergic to tape, asacol  [mesalamine ], azathioprine, gluten meal, metoprolol  tartrate, mycophenolate mofetil, other, wheat, abilify  [aripiprazole ], and amoxicillin .  MEDICATIONS:  Current Outpatient Medications  Medication Sig Dispense Refill   Cholecalciferol 50 MCG (2000 UT) CAPS Take 1 capsule by mouth daily.     ferrous sulfate  325 (65 FE) MG tablet Take 325 mg by mouth daily with breakfast. Continues to take per low HGB     fidaxomicin  (DIFICID ) 200 MG TABS tablet Take 1 tablet (200 mg total) by mouth 2 (two) times daily. 20 tablet 0   hydrOXYzine  (ATARAX ) 25 MG tablet Take 1 tablet (25 mg total) by mouth 3 (three) times daily as needed for anxiety. 30 tablet 0   mirtazapine  (REMERON ) 30 MG tablet Take 0.5 tablets (15 mg total) by mouth at bedtime.     Nutritional Supplements (ENSURE HIGH PROTEIN) LIQD Take 237 mLs by mouth in the morning and at bedtime.     Potassium 99 MG TABS Take 1 tablet by mouth daily.     predniSONE  (DELTASONE ) 20 MG tablet Take 20 mg by mouth daily with breakfast.     Vedolizumab  (ENTYVIO  IV) Inject into the vein every 6 (six) weeks.     venlafaxine  XR (EFFEXOR -XR) 150 MG 24 hr capsule Take 1 capsule (150 mg total) by mouth daily with breakfast. 30 capsule 0   venlafaxine  XR (EFFEXOR -XR) 75 MG 24 hr capsule Take 75 mg by mouth daily.     dexamethasone  (DECADRON ) 4 MG tablet Take 2 tablets (8 mg) by mouth daily x 3 days starting the day after cisplatin chemotherapy. Take with food. 30 tablet 1   lidocaine -prilocaine  (EMLA ) cream Apply to affected area once 30 g 3   ondansetron  (ZOFRAN ) 8 MG tablet Take 1 tablet (8 mg total) by mouth every 8 (eight) hours as needed for nausea or vomiting. Start on the third day  after cisplatin. 30 tablet 1   prochlorperazine  (COMPAZINE ) 10 MG tablet Take 1 tablet (10 mg total) by mouth every 6 (six) hours as needed (Nausea or vomiting). 30 tablet 1   No current facility-administered medications for this visit.     VITALS:   Blood pressure 130/86, pulse 91, temperature 97.8 F (36.6 C), temperature source Temporal, resp. rate 17, weight 158 lb 3.2 oz (71.8 kg), SpO2 100%.  Wt Readings from Last 3 Encounters:  09/15/23 158 lb 3.2 oz (71.8 kg)  09/14/23 160 lb (72.6 kg)  08/26/23 151 lb 4.8 oz (68.6 kg)    Body mass index is 20.31 kg/m.    Onc Performance Status - 09/15/23 1010       ECOG Perf Status   ECOG Perf Status Fully active, able to carry on all pre-disease performance without restriction      KPS SCALE   KPS % SCORE Normal, no compliants, no evidence of disease          PHYSICAL EXAM:   Physical Exam Constitutional:  General: He is not in acute distress.    Appearance: Normal appearance.  HENT:     Head: Normocephalic and atraumatic.  Eyes:     Conjunctiva/sclera: Conjunctivae normal.  Cardiovascular:     Rate and Rhythm: Normal rate and regular rhythm.  Pulmonary:     Effort: Pulmonary effort is normal. No respiratory distress.  Chest:     Comments: Port-A-Cath in place without any signs of infection Abdominal:     General: There is no distension.  Neurological:     General: No focal deficit present.     Mental Status: He is alert and oriented to person, place, and time.  Psychiatric:        Mood and Affect: Mood normal.        Behavior: Behavior normal.       LABORATORY DATA:   I have reviewed the data as listed.  Results for orders placed or performed in visit on 09/15/23  CBC with Differential (Cancer Center Only)  Result Value Ref Range   WBC Count 13.3 (H) 4.0 - 10.5 K/uL   RBC 3.98 (L) 4.22 - 5.81 MIL/uL   Hemoglobin 12.2 (L) 13.0 - 17.0 g/dL   HCT 62.7 (L) 60.9 - 47.9 %   MCV 93.5 80.0 - 100.0 fL    MCH 30.7 26.0 - 34.0 pg   MCHC 32.8 30.0 - 36.0 g/dL   RDW 84.1 (H) 88.4 - 84.4 %   Platelet Count 191 150 - 400 K/uL   nRBC 0.0 0.0 - 0.2 %   Neutrophils Relative % 86 %   Neutro Abs 11.6 (H) 1.7 - 7.7 K/uL   Lymphocytes Relative 4 %   Lymphs Abs 0.5 (L) 0.7 - 4.0 K/uL   Monocytes Relative 4 %   Monocytes Absolute 0.5 0.1 - 1.0 K/uL   Eosinophils Relative 0 %   Eosinophils Absolute 0.0 0.0 - 0.5 K/uL   Basophils Relative 1 %   Basophils Absolute 0.1 0.0 - 0.1 K/uL   Immature Granulocytes 5 %   Abs Immature Granulocytes 0.67 (H) 0.00 - 0.07 K/uL  CMP (Cancer Center only)  Result Value Ref Range   Sodium 139 135 - 145 mmol/L   Potassium 4.1 3.5 - 5.1 mmol/L   Chloride 104 98 - 111 mmol/L   CO2 30 22 - 32 mmol/L   Glucose, Bld 100 (H) 70 - 99 mg/dL   BUN 20 6 - 20 mg/dL   Creatinine 8.92 9.38 - 1.24 mg/dL   Calcium 8.8 (L) 8.9 - 10.3 mg/dL   Total Protein 5.7 (L) 6.5 - 8.1 g/dL   Albumin 3.2 (L) 3.5 - 5.0 g/dL   AST 76 (H) 15 - 41 U/L   ALT 126 (H) 0 - 44 U/L   Alkaline Phosphatase 255 (H) 38 - 126 U/L   Total Bilirubin 0.4 0.0 - 1.2 mg/dL   GFR, Estimated >39 >39 mL/min   Anion gap 5 5 - 15  Iron  and Iron  Binding Capacity ([facility] only)  Result Value Ref Range   Iron  23 (L) 45 - 182 ug/dL   TIBC 739 749 - 549 ug/dL   Saturation Ratios 9 (L) 17.9 - 39.5 %   UIBC 237 117 - 376 ug/dL  Ferritin  Result Value Ref Range   Ferritin 153 24 - 336 ng/mL      RADIOGRAPHIC STUDIES:  I have personally reviewed the radiological images as listed and agree with the findings in the report.  IR IMAGING GUIDED PORT  INSERTION Result Date: 09/14/2023 CLINICAL DATA:  Metastatic adenocarcinoma, needs durable venous access for planned treatment regimen EXAM: TUNNELED PORT CATHETER PLACEMENT WITH ULTRASOUND AND FLUOROSCOPIC GUIDANCE FLUOROSCOPY: Radiation Exposure Index (as provided by the fluoroscopic device): 1 mGy air Kerma ANESTHESIA/SEDATION: Intravenous Fentanyl  100mcg and Versed   2mg  were administered by RN during a total moderate (conscious) sedation time of 12 minutes; the patient's level of consciousness and physiological / cardiorespiratory status were monitored continuously by radiology RN under my direct supervision. TECHNIQUE: The procedure, risks, benefits, and alternatives were explained to the patient. Questions regarding the procedure were encouraged and answered. The patient understands and consents to the procedure. Patency of the right IJ vein was confirmed with ultrasound with image documentation. An appropriate skin site was determined. Skin site was marked. Region was prepped using maximum barrier technique including cap and mask, sterile gown, sterile gloves, large sterile sheet, and Chlorhexidine  as cutaneous antisepsis. The region was infiltrated locally with 1% lidocaine . Under real-time ultrasound guidance, the right IJ vein was accessed with a 21 gauge micropuncture needle; the needle tip within the vein was confirmed with ultrasound image documentation. Needle was exchanged over a 018 guidewire for transitional dilator, and vascular measurement was performed. A small incision was made on the right anterior chest wall and a subcutaneous pocket fashioned. The power-injectable port was positioned and its catheter tunneled to the right IJ dermatotomy site. The transitional dilator was exchanged over an Amplatz wire for a peel-away sheath, through which the port catheter, which had been trimmed to the appropriate length, was advanced and positioned under fluoroscopy with its tip at the cavoatrial junction. Spot chest radiograph confirms good catheter position and no pneumothorax. The port was flushed per protocol. The pocket was closed with deep interrupted and subcuticular continuous 3-0 Monocryl sutures. The incisions were covered with Dermabond then covered with a sterile dressing. The patient tolerated the procedure well. COMPLICATIONS: COMPLICATIONS None immediate  IMPRESSION: Technically successful right IJ power-injectable port catheter placement. Ready for routine use. Electronically Signed   By: JONETTA Faes M.D.   On: 09/14/2023 12:47   NM PET Image Initial (PI) Skull Base To Thigh Result Date: 09/13/2023 CLINICAL DATA:  Initial treatment strategy for biliary adenocarcinoma EXAM: NUCLEAR MEDICINE PET SKULL BASE TO THIGH TECHNIQUE: 7.54 mCi F-18 FDG was injected intravenously. Full-ring PET imaging was performed from the skull base to thigh after the radiotracer. CT data was obtained and used for attenuation correction and anatomic localization. Fasting blood glucose: 124 mg/dl COMPARISON:  CT 93/78/7974 FINDINGS: Mediastinal blood pool activity: SUV max 2.2 Liver activity: SUV max 2.4 NECK: No specific abnormal uptake seen in the neck including along lymph node change of the submandibular, posterior triangle or internal jugular region. Near symmetric uptake of the intracranial compartment included in the imaging field. Incidental CT findings: The parotid glands, submandibular glands unremarkable. Small thyroid  gland. Streak artifact related to the patient's dental hardware. There is some mild opacity along the right maxillary paranasal sinus without uptake. Mastoid air cells are grossly clear. Mild scattered vascular calcifications in the neck. CHEST: No abnormal uptake above blood pool in the axillary regions, hilum or mediastinum. No abnormal pulmonary uptake. Incidental CT findings: Slightly patulous thoracic esophagus. The heart is nonenlarged. No pericardial effusion. The thoracic aorta is normal course and caliber with some calcified plaque. There is some linear opacity lung bases likely scar or atelectasis. ABDOMEN/PELVIS: The dome right hepatic lobe posterior complex lesion seen on previous imaging is again identified. This has some marginal  heterogeneous uptake. Most intense areas lateral inferior with maximum SUV of 4.5. Lesion has some small calcifications.  Dimension measured on CT image 104 series 4 today measuring 4.9 by 3.6 cm. Please correlate for exact location of neoplasm. There is some adjacent cystic foci identified as well. There is also some vague low-attenuation foci in segment 4A and 2. Example on image 109 of series 4 the CT today measures 13 mm. This area is new from previous MRI. Additional focus on image 101 measures 10 mm. These areas do not clearly show abnormal radiotracer uptake at this time but with the change would have a differential. Otherwise there is physiologic distribution radiotracer on the other parenchymal organs, bowel and renal collecting systems. No areas of abnormal nodal uptake. There is 1 area of asymmetric uptake along the anal region with some nodular areas and extraluminal air such as series 4, image 207. Uptake in this location of 10.2. Incidental CT findings: Scattered vascular calcifications. Normal caliber aorta and IVC. No renal or ureteral stones. Preserved contour to the urinary bladder. Scattered colonic stool. Left-sided colonic diverticula. Small bowel is nondilated. SKELETON: No specific abnormal uptake identified along the visualized osseous structures. Incidental CT findings: Curvature of the spine. Scattered degenerative changes. IMPRESSION: Marginal areas of uptake seen along the mass lesion in the posterosuperior right hepatic lobe correspond to the area of known neoplasm. There are some small cystic areas elsewhere in the liver. There is a new small low-density focus in the dome left hepatic lobe. No appreciable abnormal uptake but with the change would recommend further workup such as a follow up MRI to confirm etiology. No areas of abnormal uptake seen above the diaphragm. No areas of abnormal nodal uptake identified this time. Heterogeneous uptake with some extraluminal nodularity and gas at the level of the anal canal on the left side. Based on appearance would have a differential including a anal fistula.  Neoplasm based on the CT appearance is felt to be less likely. Please correlate clinical presentation. If needed dedicated anal fistula MRI protocol could be obtained. Electronically Signed   By: Ranell Bring M.D.   On: 09/13/2023 12:49   CT ABDOMEN PELVIS W CONTRAST Result Date: 08/20/2023 CLINICAL DATA:  New onset bloody stool. History of Crohn's disease. Recent ultrasound-guided liver biopsy. Well differentiated adenocarcinoma on liver biopsy. * Tracking Code: BO *. EXAM: CT ABDOMEN AND PELVIS WITH CONTRAST TECHNIQUE: Multidetector CT imaging of the abdomen and pelvis was performed using the standard protocol following bolus administration of intravenous contrast. RADIATION DOSE REDUCTION: This exam was performed according to the departmental dose-optimization program which includes automated exposure control, adjustment of the mA and/or kV according to patient size and/or use of iterative reconstruction technique. CONTRAST:  OMNIPAQUE  IOHEXOL  300 MG/ML  SOLN COMPARISON:  CT 07/02/2023, MRI 07/03/2023 FINDINGS: Lower chest: No pulmonary nodules the lung bases. Hepatobiliary: Lesion lateral margin of the RIGHT hepatic lobe is increased in the interval from prior CT measuring 4.6 x 3.6 cm compared to 3.7 x 2.8 cm. A small cystic collection adjacent to the expanding mass is not changed measuring 1.3 cm compared to 1.3 cm. Additionally cystic lesion inferior to the hepatic mass measures 4. 5 cm compared to 4.7 cm for no change. Gallbladder unchanged. Several small cystic lesions in the LEFT hepatic lobe unchanged. Small low-density lesions centrally in liver measuring 7 mm on image 13/2 is indeterminate. Similar lesion in the LEFT hepatic lobe more lateral measuring 6 mm image 18 indeterminate. These lesions  are not well appreciated on comparison MRI. Comparison CT with noncontrast. Pancreas: Pancreas is normal. No ductal dilatation. No pancreatic inflammation. Spleen: Normal spleen Adrenals/urinary tract:  Adrenal glands and kidneys are normal. The ureters and bladder normal. Stomach/Bowel: Small diverticulum posterior to the gastric fundus. Stomach, duodenum and small-bowel normal. Ascending transverse and descending colon appear normal. There is fluid in the rectum. Vascular/Lymphatic: Abdominal aorta is normal caliber with atherosclerotic calcification. There is no retroperitoneal or periportal lymphadenopathy. No pelvic lymphadenopathy. Reproductive: Prostate unremarkable Other: No free fluid. Musculoskeletal: No aggressive osseous lesion. IMPRESSION: 1. Enlarging mass in the RIGHT hepatic lobe consistent with biopsy-proven adenocarcinoma. 2. Two small hypodense lesions in LEFT hepatic lobe are indeterminate 3. No evidence of bowel inflammation. No stenosis or fistula. Fluid in rectum. Electronically Signed   By: Jackquline Boxer M.D.   On: 08/20/2023 11:53     CODE STATUS:  Code Status History     Date Active Date Inactive Code Status Order ID Comments User Context   09/14/2023 1143 09/15/2023 0511 Full Code [id number]  Johann Sieving, MD HOV   08/20/2023 1401 08/23/2023 1406 Full Code [id number]  Lou Claretta HERO, MD ED   07/29/2023 1419 07/30/2023 0518 Full Code [id number]  Johann Sieving, MD HOV   07/02/2023 1533 07/05/2023 2312 Full Code [id number]  Celinda Alm Lot, MD ED   04/13/2023 1949 05/20/2023 1942 Full Code [id number]  Motley-Mangrum, Cathaleen LABOR, PMHNP Inpatient   03/28/2023 2234 04/04/2023 1348 Full Code [id number]  Alan Thurman GAILS, NP Inpatient   03/28/2023 1136 03/28/2023 2111 Full Code [id number]  Minnie Tinnie BRAVO, PA ED   08/26/2018 1234 09/01/2018 2105 Full Code [id number]  Arloa Chroman, PA-C ED   08/25/2018 1240 08/25/2018 2000 Full Code [id number]  Little, Vernell Search, MD ED   07/31/2018 1406 08/01/2018 1446 Full Code [id number]  Zada Elouise BROCKS, PA-C ED   07/19/2018 2045 07/21/2018 1739 DNR [id number]  [place], Eric J, DO Inpatient   05/28/2018 1506 06/01/2018 1222 DNR [id number]  Barbarann Nest, MD  Inpatient   05/20/2012 1059 05/25/2012 1606 DNR [id number]  Cherisse Ronal ORN, NP Inpatient   05/18/2012 1723 05/20/2012 1059 Full Code [id number]  Juvenal Harlene PENNER, DO Inpatient    Questions for Most Recent Historical Code Status (Order [id number])     Question Answer   By: Other            Orders Placed This Encounter  Procedures   Consent Attestation for Oncology Treatment    The patient is informed of risks, benefits, side-effects of the prescribed oncology treatment. Potential short term and long term side effects and response rates discussed. After a long discussion, the patient made informed decision to proceed.:   Yes   C difficile quick screen w PCR reflex    Standing Status:   Future    Expected Date:   09/16/2023    Expiration Date:   09/14/2024   CBC with Differential (Cancer Center Only)    Standing Status:   Future    Expected Date:   09/26/2023    Expiration Date:   09/25/2024   CMP (Cancer Center only)    Standing Status:   Future    Expected Date:   09/26/2023    Expiration Date:   09/25/2024   Magnesium     Standing Status:   Future    Expected Date:   09/26/2023    Expiration Date:   09/25/2024   CBC with Differential (Cancer Center Only)    Standing Status:  Future    Expected Date:   10/03/2023    Expiration Date:   10/02/2024   CMP (Cancer Center only)    Standing Status:   Future    Expected Date:   10/03/2023    Expiration Date:   10/02/2024   Magnesium     Standing Status:   Future    Expected Date:   10/03/2023    Expiration Date:   10/02/2024   CBC with Differential (Cancer Center Only)    Standing Status:   Future    Expected Date:   10/17/2023    Expiration Date:   10/16/2024   CMP (Cancer Center only)    Standing Status:   Future    Expected Date:   10/17/2023    Expiration Date:   10/16/2024   Magnesium     Standing Status:   Future    Expected Date:   10/17/2023    Expiration Date:   10/16/2024   CBC with Differential (Cancer Center Only)    Standing Status:    Future    Expected Date:   10/24/2023    Expiration Date:   10/23/2024   CMP (Cancer Center only)    Standing Status:   Future    Expected Date:   10/24/2023    Expiration Date:   10/23/2024   Magnesium     Standing Status:   Future    Expected Date:   10/24/2023    Expiration Date:   10/23/2024   PHYSICIAN COMMUNICATION ORDER    A baseline Audiogram is recommended prior to initiation of cisplatin chemotherapy.     Future Appointments  Date Time Provider Department Center  09/22/2023  9:00 AM [facility] CHEMO EDU [facility] None  09/22/2023 11:15 AM [facility] LAB [facility] None  09/28/2023  7:30 AM [facility] INFUSION [facility] None  09/28/2023  9:30 AM Hanford Powell BRAVO, NP [facility] None  10/19/2023 10:30 AM Avram Lupita BRAVO, MD [facility]      This document was completed utilizing speech recognition software. Grammatical errors, random word insertions, pronoun errors, and incomplete sentences are an occasional consequence of this system due to software limitations, ambient noise, and hardware issues. Any formal questions or concerns about the content, text or information contained within the body of this dictation should be directly addressed to the provider for clarification.

## 2023-09-15 NOTE — Assessment & Plan Note (Signed)
 Recurrent Clostridioides difficile infection, currently managed with Dificid  (fidaxomicin ) for a 10-day course. Previous treatment with vancomycin  was ineffective. Imodium  was used to manage diarrhea for urgent situations, but its use is discouraged due to the risk of exacerbating the infection. No fever, nausea, vomiting, or abdominal pain reported. - Continue Dificid  (fidaxomicin ) for 10 days. - Avoid Imodium  unless absolutely necessary. - Provide stool sample kit for reassessment if diarrhea persists.

## 2023-09-15 NOTE — Assessment & Plan Note (Signed)
 Crohn's disease managed with Entyvio . No current symptoms of concern such as blood in stools or black stools reported.

## 2023-09-16 ENCOUNTER — Inpatient Hospital Stay: Admitting: Oncology

## 2023-09-16 ENCOUNTER — Inpatient Hospital Stay

## 2023-09-16 ENCOUNTER — Other Ambulatory Visit: Payer: Self-pay

## 2023-09-16 DIAGNOSIS — K50919 Crohn's disease, unspecified, with unspecified complications: Secondary | ICD-10-CM | POA: Diagnosis not present

## 2023-09-16 DIAGNOSIS — K610 Anal abscess: Secondary | ICD-10-CM | POA: Diagnosis not present

## 2023-09-17 DIAGNOSIS — R970 Elevated carcinoembryonic antigen [CEA]: Secondary | ICD-10-CM | POA: Diagnosis not present

## 2023-09-17 DIAGNOSIS — C221 Intrahepatic bile duct carcinoma: Secondary | ICD-10-CM | POA: Diagnosis not present

## 2023-09-17 DIAGNOSIS — R932 Abnormal findings on diagnostic imaging of liver and biliary tract: Secondary | ICD-10-CM | POA: Diagnosis not present

## 2023-09-17 DIAGNOSIS — R933 Abnormal findings on diagnostic imaging of other parts of digestive tract: Secondary | ICD-10-CM | POA: Diagnosis not present

## 2023-09-20 ENCOUNTER — Other Ambulatory Visit: Payer: Self-pay

## 2023-09-20 ENCOUNTER — Encounter (HOSPITAL_COMMUNITY): Payer: Self-pay

## 2023-09-20 DIAGNOSIS — A0472 Enterocolitis due to Clostridium difficile, not specified as recurrent: Secondary | ICD-10-CM

## 2023-09-21 DIAGNOSIS — H524 Presbyopia: Secondary | ICD-10-CM | POA: Diagnosis not present

## 2023-09-21 DIAGNOSIS — H26493 Other secondary cataract, bilateral: Secondary | ICD-10-CM | POA: Diagnosis not present

## 2023-09-21 DIAGNOSIS — H04123 Dry eye syndrome of bilateral lacrimal glands: Secondary | ICD-10-CM | POA: Diagnosis not present

## 2023-09-21 DIAGNOSIS — H52203 Unspecified astigmatism, bilateral: Secondary | ICD-10-CM | POA: Diagnosis not present

## 2023-09-21 LAB — C DIFFICILE QUICK SCREEN W PCR REFLEX
C Diff antigen: POSITIVE — AB
C Diff toxin: NEGATIVE

## 2023-09-21 LAB — CLOSTRIDIUM DIFFICILE BY PCR, REFLEXED: Toxigenic C. Difficile by PCR: POSITIVE — AB

## 2023-09-22 ENCOUNTER — Inpatient Hospital Stay

## 2023-09-22 DIAGNOSIS — C221 Intrahepatic bile duct carcinoma: Secondary | ICD-10-CM | POA: Diagnosis not present

## 2023-09-22 DIAGNOSIS — R932 Abnormal findings on diagnostic imaging of liver and biliary tract: Secondary | ICD-10-CM | POA: Diagnosis not present

## 2023-09-22 DIAGNOSIS — R970 Elevated carcinoembryonic antigen [CEA]: Secondary | ICD-10-CM | POA: Diagnosis not present

## 2023-09-22 DIAGNOSIS — Z95828 Presence of other vascular implants and grafts: Secondary | ICD-10-CM | POA: Insufficient documentation

## 2023-09-22 DIAGNOSIS — R933 Abnormal findings on diagnostic imaging of other parts of digestive tract: Secondary | ICD-10-CM | POA: Diagnosis not present

## 2023-09-22 LAB — CBC WITH DIFFERENTIAL (CANCER CENTER ONLY)
Abs Immature Granulocytes: 0.98 K/uL — ABNORMAL HIGH (ref 0.00–0.07)
Basophils Absolute: 0 K/uL (ref 0.0–0.1)
Basophils Relative: 0 %
Eosinophils Absolute: 0 K/uL (ref 0.0–0.5)
Eosinophils Relative: 0 %
HCT: 38 % — ABNORMAL LOW (ref 39.0–52.0)
Hemoglobin: 12.3 g/dL — ABNORMAL LOW (ref 13.0–17.0)
Immature Granulocytes: 8 %
Lymphocytes Relative: 6 %
Lymphs Abs: 0.7 K/uL (ref 0.7–4.0)
MCH: 30.6 pg (ref 26.0–34.0)
MCHC: 32.4 g/dL (ref 30.0–36.0)
MCV: 94.5 fL (ref 80.0–100.0)
Monocytes Absolute: 0.4 K/uL (ref 0.1–1.0)
Monocytes Relative: 3 %
Neutro Abs: 10.6 K/uL — ABNORMAL HIGH (ref 1.7–7.7)
Neutrophils Relative %: 83 %
Platelet Count: 247 K/uL (ref 150–400)
RBC: 4.02 MIL/uL — ABNORMAL LOW (ref 4.22–5.81)
RDW: 15.8 % — ABNORMAL HIGH (ref 11.5–15.5)
Smear Review: NORMAL
WBC Count: 12.8 K/uL — ABNORMAL HIGH (ref 4.0–10.5)
nRBC: 0 % (ref 0.0–0.2)

## 2023-09-22 LAB — CMP (CANCER CENTER ONLY)
ALT: 172 U/L — ABNORMAL HIGH (ref 0–44)
AST: 110 U/L — ABNORMAL HIGH (ref 15–41)
Albumin: 3.4 g/dL — ABNORMAL LOW (ref 3.5–5.0)
Alkaline Phosphatase: 241 U/L — ABNORMAL HIGH (ref 38–126)
Anion gap: 4 — ABNORMAL LOW (ref 5–15)
BUN: 21 mg/dL — ABNORMAL HIGH (ref 6–20)
CO2: 30 mmol/L (ref 22–32)
Calcium: 8.7 mg/dL — ABNORMAL LOW (ref 8.9–10.3)
Chloride: 107 mmol/L (ref 98–111)
Creatinine: 0.86 mg/dL (ref 0.61–1.24)
GFR, Estimated: >60 mL/min (ref >60)
Glucose, Bld: 117 mg/dL — ABNORMAL HIGH (ref 70–99)
Potassium: 4.3 mmol/L (ref 3.5–5.1)
Sodium: 141 mmol/L (ref 135–145)
Total Bilirubin: 0.4 mg/dL (ref 0.0–1.2)
Total Protein: 6.1 g/dL — ABNORMAL LOW (ref 6.5–8.1)

## 2023-09-22 LAB — MAGNESIUM: Magnesium: 2.3 mg/dL (ref 1.7–2.4)

## 2023-09-22 MED ORDER — HEPARIN SOD (PORK) LOCK FLUSH 100 UNIT/ML IV SOLN
500.0000 [IU] | Freq: Once | INTRAVENOUS | Status: AC
  Administered 2023-09-22: 500 [IU]

## 2023-09-22 MED ORDER — HEPARIN SOD (PORK) LOCK FLUSH 100 UNIT/ML IV SOLN: 250.0000 [IU] | Freq: Once | INTRAVENOUS | Status: DC

## 2023-09-22 MED ORDER — SODIUM CHLORIDE 0.9% FLUSH
10.0000 mL | Freq: Once | INTRAVENOUS | Status: AC
  Administered 2023-09-22: 10 mL

## 2023-09-23 ENCOUNTER — Telehealth: Payer: Self-pay | Admitting: Oncology

## 2023-09-23 NOTE — Telephone Encounter (Signed)
 I informed [name] of his appointments scheduled for 8/4-8/7. I asked [name] to return my call if he needs to make any changes to his schedule

## 2023-09-26 DIAGNOSIS — M9903 Segmental and somatic dysfunction of lumbar region: Secondary | ICD-10-CM | POA: Diagnosis not present

## 2023-09-27 ENCOUNTER — Encounter: Payer: Self-pay | Admitting: Oncology

## 2023-09-27 NOTE — Progress Notes (Unsigned)
 Per Dr Lanny, ok to treat with labs from 09/22/23- AST 110 and ALT 172.

## 2023-09-28 ENCOUNTER — Other Ambulatory Visit: Payer: Self-pay

## 2023-09-28 ENCOUNTER — Inpatient Hospital Stay (HOSPITAL_BASED_OUTPATIENT_CLINIC_OR_DEPARTMENT_OTHER): Admitting: Nurse Practitioner

## 2023-09-28 ENCOUNTER — Inpatient Hospital Stay

## 2023-09-28 VITALS — BP 157/79 | HR 84 | Temp 98.4°F | Resp 17

## 2023-09-28 VITALS — BP 157/79 | HR 84 | Temp 98.4°F | Resp 17 | Wt 151.0 lb

## 2023-09-28 DIAGNOSIS — C221 Intrahepatic bile duct carcinoma: Secondary | ICD-10-CM

## 2023-09-28 DIAGNOSIS — R970 Elevated carcinoembryonic antigen [CEA]: Secondary | ICD-10-CM | POA: Diagnosis not present

## 2023-09-28 DIAGNOSIS — R933 Abnormal findings on diagnostic imaging of other parts of digestive tract: Secondary | ICD-10-CM | POA: Diagnosis not present

## 2023-09-28 DIAGNOSIS — R932 Abnormal findings on diagnostic imaging of liver and biliary tract: Secondary | ICD-10-CM | POA: Diagnosis not present

## 2023-09-28 MED ORDER — APREPITANT 130 MG/18ML IV EMUL
130.0000 mg | Freq: Once | INTRAVENOUS | Status: AC
  Administered 2023-09-28: 130 mg via INTRAVENOUS
  Filled 2023-09-28: qty 18

## 2023-09-28 MED ORDER — POTASSIUM CHLORIDE IN NACL 20-0.9 MEQ/L-% IV SOLN
Freq: Once | INTRAVENOUS | Status: AC
  Filled 2023-09-28: qty 1000

## 2023-09-28 MED ORDER — PALONOSETRON HCL INJECTION 0.25 MG/5ML
0.2500 mg | Freq: Once | INTRAVENOUS | Status: AC
  Administered 2023-09-28: 0.25 mg via INTRAVENOUS
  Filled 2023-09-28: qty 5

## 2023-09-28 MED ORDER — SODIUM CHLORIDE 0.9 % IV SOLN
25.0000 mg/m2 | Freq: Once | INTRAVENOUS | Status: AC
  Administered 2023-09-28: 50 mg via INTRAVENOUS
  Filled 2023-09-28: qty 50

## 2023-09-28 MED ORDER — SODIUM CHLORIDE 0.9% FLUSH: 10.0000 mL | INTRAVENOUS | Status: DC | PRN

## 2023-09-28 MED ORDER — SODIUM CHLORIDE 0.9 % IV SOLN
1000.0000 mg/m2 | Freq: Once | INTRAVENOUS | Status: AC
  Administered 2023-09-28: 1938 mg via INTRAVENOUS
  Filled 2023-09-28: qty 50.97

## 2023-09-28 MED ORDER — SODIUM CHLORIDE 0.9 % IV SOLN: INTRAVENOUS | Status: DC

## 2023-09-28 MED ORDER — MAGNESIUM SULFATE 2 GM/50ML IV SOLN
2.0000 g | Freq: Once | INTRAVENOUS | Status: AC
  Administered 2023-09-28: 2 g via INTRAVENOUS
  Filled 2023-09-28: qty 50

## 2023-09-28 MED ORDER — DEXAMETHASONE SODIUM PHOSPHATE 10 MG/ML IJ SOLN
10.0000 mg | Freq: Once | INTRAMUSCULAR | Status: AC
  Administered 2023-09-28: 10 mg via INTRAVENOUS
  Filled 2023-09-28: qty 1

## 2023-09-28 NOTE — Assessment & Plan Note (Addendum)
 Intrahepatic cholangiocarcinoma, diagnosed following MRI and liver biopsy in May 2025. He underwent ultrasound-guided biopsy of liver lesion on 07/29/2023.  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 CDX-2 positive, CK20  positive, CK7 positive, synaptophysin negative, chromogranin negative.  IHC profile suggested a gastrointestinal or pancreaticobiliary primary. To look for a primary, patient underwent upper endoscopy and colonoscopy on 08/18/2023, by Dr. Avram.  EGD showed evidence of gastritis, single gastric polyp.  Otherwise unremarkable.  Colonoscopy showed several polyps in the transverse colon, sigmoid colon, descending colon.  Evidence of diverticulosis in the sigmoid colon.  Multiple biopsies were obtained.  Biopsies from the stomach, colon, cecum all showed benign findings.  No evidence of malignancy noted. Clinical picture is consistent with intrahepatic cholangiocarcinoma, primarily located in the right lobe of the liver with disease limited to the liver area. A new small lesion on the dome of the left side of the liver noted on PET scan and also recent CT scan.  Too small to be biopsied.  Will have to monitor this on future scans.  His case was previously discussed in GI multidisciplinary tumor conference.  Dr. Dasie felt that he can be a surgical candidate following neoadjuvant chemotherapy.  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 Given history of autoimmune hepatitis, primary sclerosing cholangitis, he would not be a candidate for immunotherapy.  Hence plan would be to proceed with chemotherapy using cisplatin  and gemcitabine . Request previously submitted for NGS testing on the specimen and liquid biopsy. - Pending clearance from Dr. Debby following drainage of pelvic/anal abscess, we will plan to administer  chemotherapy with cisplatin  and gemcitabine  once weekly for two weeks on, one week off, depending on tolerance.  He will have chemo education session next week. -He has had chronic C. difficile infection and he thinks the infection may have come back.  He will provide stool sample for testing, hopefully tomorrow.  Sample was positive for C. difficile.  He completed antibiotics yesterday, 09/27/2023.  He does have fidaxomicin  available and started taking it.  Will have to ensure that he is free of symptoms before we can consider chemotherapy initiation. - Monitor kidney function and ensure adequate hydration to prevent kidney dysfunction. - Monitor blood counts, liver function, and for side effects such as nausea, fatigue, hearing loss, and neuropathy. - Refer to Dr.Shelby Dasie for surgical evaluation post-chemotherapy. - Monitor CA 19-9 and CEA levels monthly.   - 09/28/2023 -received with cycle 1 day 1 chemotherapy with cisplatin  and gemcitabine 

## 2023-09-28 NOTE — Patient Instructions (Addendum)
 [facility] - A DEPT OF [facility]  Discharge Instructions: Thank you for choosing [facility] to provide your oncology and hematology care.   If you have a lab appointment with the Cancer Center, please go directly to the Cancer Center and check in at the registration area.   Wear comfortable clothing and clothing appropriate for easy access to any Portacath or PICC line.   We strive to give you quality time with your provider. You may need to reschedule your appointment if you arrive late (15 or more minutes).  Arriving late affects you and other patients whose appointments are after yours.  Also, if you miss three or more appointments without notifying the office, you may be dismissed from the clinic at the provider's discretion.      For prescription refill requests, have your pharmacy contact our office and allow 72 hours for refills to be completed.    Today you received the following chemotherapy and/or immunotherapy agents: Gemzar  (Gemcitabine ) & Platinol  (Cisplatin )    To help prevent nausea and vomiting after your treatment, we encourage you to take your nausea medication as directed.  BELOW ARE SYMPTOMS THAT SHOULD BE REPORTED IMMEDIATELY: *FEVER GREATER THAN 100.4 F (38 C) OR HIGHER *CHILLS OR SWEATING *NAUSEA AND VOMITING THAT IS NOT CONTROLLED WITH YOUR NAUSEA MEDICATION *UNUSUAL SHORTNESS OF BREATH *UNUSUAL BRUISING OR BLEEDING *URINARY PROBLEMS (pain or burning when urinating, or frequent urination) *BOWEL PROBLEMS (unusual diarrhea, constipation, pain near the anus) TENDERNESS IN MOUTH AND THROAT WITH OR WITHOUT PRESENCE OF ULCERS (sore throat, sores in mouth, or a toothache) UNUSUAL RASH, SWELLING OR PAIN  UNUSUAL VAGINAL DISCHARGE OR ITCHING   Items with * indicate a potential emergency and should be followed up as soon as possible or go to the Emergency Department if any problems should occur.  Please show the  CHEMOTHERAPY ALERT CARD or IMMUNOTHERAPY ALERT CARD at check-in to the Emergency Department and triage nurse.  Should you have questions after your visit or need to cancel or reschedule your appointment, please contact [facility] - [facility] DE[facility]  Dept: [phone]  and follow the prompts.  Office hours are 8:00 a.m. to 4:30 p.m. Monday - Friday. Please note that voicemails left after 4:00 p.m. may not be returned until the following business day.  We are closed weekends and major holidays. You have access to a nurse at all times for urgent questions. Please call the main number to the clinic Dept: [phone] and follow the prompts.   For any non-urgent questions, you may also contact your provider using MyChart. We now offer e-Visits for anyone 98 and older to request care online for non-urgent symptoms. For details visit [email or web address].   Also download the MyChart app! Go to the app store, search MyChart, open the app, select [facility], and log in with your MyChart username and password.

## 2023-09-28 NOTE — Progress Notes (Signed)
 PATIENT NAVIGATOR PROGRESS NOTE  Name: [name] Date: 09/28/2023 MRN: [id number]  DOB: 01-12-1966  Patient is established with a treatment plan and is actively engaged in care. Nurse Navigator services not currently indicated at this time. Will re-evaluate if needs change or if additional support is requested.

## 2023-09-28 NOTE — Progress Notes (Signed)
 Patient Care Team: [name], MD as PCP - General (Family Medicine) Avram Lupita BRAVO, MD as Attending Physician (Gastroenterology) Harvey Carlin BRAVO, MD (Inactive) as Attending Physician (Vascular Surgery) Ardis Evalene CROME, RN as Oncology Nurse Navigator Autumn Millman, MD as Consulting Physician (Hematology and Oncology)  Clinic Day:  09/28/2023   Referring physician: [name], MD  ASSESSMENT & PLAN:   Assessment & Plan: Intrahepatic cholangiocarcinoma [facility]) Intrahepatic cholangiocarcinoma, diagnosed following MRI and liver biopsy in May 2025. He underwent ultrasound-guided biopsy of liver lesion on 07/29/2023.  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 CDX-2 positive, CK20  positive, CK7 positive, synaptophysin negative, chromogranin negative.  IHC profile suggested a gastrointestinal or pancreaticobiliary primary. To look for a primary, patient underwent upper endoscopy and colonoscopy on 08/18/2023, by Dr. Avram.  EGD showed evidence of gastritis, single gastric polyp.  Otherwise unremarkable.  Colonoscopy showed several polyps in the transverse colon, sigmoid colon, descending colon.  Evidence of diverticulosis in the sigmoid colon.  Multiple biopsies were obtained.  Biopsies from the stomach, colon, cecum all showed benign findings.  No evidence of malignancy noted. Clinical picture is consistent with intrahepatic cholangiocarcinoma, primarily located in the right lobe of the liver with disease limited to the liver area. A new small lesion on the dome of the left side of the liver noted on PET scan and also recent CT scan.  Too small to be biopsied.  Will have to monitor this on future scans.  His case was previously discussed in GI multidisciplinary tumor conference.  Dr. Dasie felt that he can be a surgical candidate following neoadjuvant chemotherapy.  The treatment plan is influenced by  primary sclerosing cholangitis and Crohn's disease, complicating the risk-benefit analysis of chemotherapy. Given history of autoimmune hepatitis, primary sclerosing cholangitis, he would not be a candidate for immunotherapy.  Hence plan would be to proceed with chemotherapy using cisplatin  and gemcitabine . Request previously submitted for NGS testing on the specimen and liquid biopsy. - Pending clearance from Dr. Debby following drainage of pelvic/anal abscess, we will plan to administer chemotherapy with cisplatin  and gemcitabine  once weekly for two weeks on, one week off, depending on tolerance.  He will have chemo education session next week. -He has had chronic C. difficile infection and he thinks the infection may have come back.  He will provide stool sample for testing, hopefully tomorrow.  Sample was positive for C. difficile.  He completed antibiotics yesterday, 09/27/2023.  He does have fidaxomicin  available and started taking it.  Will have to ensure that he is free of symptoms before we can consider chemotherapy initiation. - Monitor kidney function and ensure adequate hydration to prevent kidney dysfunction. - Monitor blood counts, liver function, and for side effects such as nausea, fatigue, hearing loss, and neuropathy. - Refer to Dr.Shelby Dasie for surgical evaluation post-chemotherapy. - Monitor CA 19-9 and CEA levels monthly.   - 09/28/2023 -received with cycle 1 day 1 chemotherapy with cisplatin  and gemcitabine    Intrahepatic cholangiocarcinoma The patient was seen in infusion today. He presents for cycle 1 chemotherapy with cisplatin  and gemcitabine . He has moderately elevated LFTs. Levels are improved. He also has primary sclerosing cholangitis and autoimmune hepatitis, He has no questions. He reports feeling well in general. Consulted with Dr. Lanny who agrees we can proceed with treatment. Will proceed with Cycle 1 day 1 chemotherapy with cisplatin  and gemcitabine .   Plan.   Patient seen on infusion suite. Labs reviewed.  -  CBC with mild elevation of WBC and anc. Values are 12.8 and 10.6 respectively.  -Elevated LFTs with multiple comorbid conditions which are contributing factors.   Overall, patient labs and presentation are appropriate for treatment.  Proceed with Cycle 1 day 1 chemotherapy cisplatin  and gemcitabine .  Labs/flush, follow up, and treatment as scheduled. Advised him to contact clinic for any questions or concerns.   The patient understands the plans discussed today and is in agreement with them.  He knows to contact our office if he develops concerns prior to his next appointment.  I provided 20 minutes of face-to-face time during this encounter and > 50% was spent counseling as documented under my assessment and plan.    Powell FORBES Lessen, NP  [facility] - A DEPT OF [facility] [address] [place] [address] Dept: [phone] Dept Fax: [phone]   No orders of the defined types were placed in this encounter.     CHIEF COMPLAINT:  CC: intrahepatic cholangiocarcinoma   Current Treatment:  cisplatin  and gemcitabine .   INTERVAL HISTORY:  Dallin is here today for repeat clinical assessment. Last saw Dr. Autumn on 09/15/2023. Today is first dose chemotherapy with gemcitabine  and cisplatin .  Overall, today he is feeling well.  He denies current concerns or complaints.  He denies chest pain, chest pressure, or shortness of breath. He denies headaches or visual disturbances. He denies abdominal pain, nausea, vomiting, or changes in bowel or bladder habits.  He denies fevers or chills. He denies pain. His appetite is poor.  States he is trying to increase his protein intake.  His weight has decreased 7 pounds over last 2 weeks.  I have reviewed the past medical history, past surgical history, social history and family history with the patient and they are unchanged from  previous note.  ALLERGIES:  is allergic to tape, asacol  [mesalamine ], azathioprine, gluten meal, metoprolol  tartrate, mycophenolate mofetil, other, wheat, abilify  [aripiprazole ], and amoxicillin .  MEDICATIONS:  Current Outpatient Medications  Medication Sig Dispense Refill   Cholecalciferol 50 MCG (2000 UT) CAPS Take 1 capsule by mouth daily.     dexamethasone  (DECADRON ) 4 MG tablet Take 2 tablets (8 mg) by mouth daily x 3 days starting the day after cisplatin  chemotherapy. Take with food. 30 tablet 1   ferrous sulfate  325 (65 FE) MG tablet Take 325 mg by mouth daily with breakfast. Continues to take per low HGB     fidaxomicin  (DIFICID ) 200 MG TABS tablet Take 1 tablet (200 mg total) by mouth 2 (two) times daily. 20 tablet 0   hydrOXYzine  (ATARAX ) 25 MG tablet Take 1 tablet (25 mg total) by mouth 3 (three) times daily as needed for anxiety. 30 tablet 0   lidocaine -prilocaine  (EMLA ) cream Apply to affected area once 30 g 3   mirtazapine  (REMERON ) 30 MG tablet Take 0.5 tablets (15 mg total) by mouth at bedtime.     Nutritional Supplements (ENSURE HIGH PROTEIN) LIQD Take 237 mLs by mouth in the morning and at bedtime.     ondansetron  (ZOFRAN ) 8 MG tablet Take 1 tablet (8 mg total) by mouth every 8 (eight) hours as needed for nausea or vomiting. Start on the third day after cisplatin . 30 tablet 1   Potassium 99 MG TABS Take 1 tablet by mouth daily.     predniSONE  (DELTASONE ) 20 MG tablet Take 20 mg by mouth daily with breakfast.     prochlorperazine  (COMPAZINE ) 10  MG tablet Take 1 tablet (10 mg total) by mouth every 6 (six) hours as needed (Nausea or vomiting). 30 tablet 1   Vedolizumab  (ENTYVIO  IV) Inject into the vein every 6 (six) weeks.     venlafaxine  XR (EFFEXOR -XR) 150 MG 24 hr capsule Take 1 capsule (150 mg total) by mouth daily with breakfast. 30 capsule 0   venlafaxine  XR (EFFEXOR -XR) 75 MG 24 hr capsule Take 75 mg by mouth daily.     No current facility-administered medications for  this visit.    HISTORY OF PRESENT ILLNESS:   Oncology History  Intrahepatic cholangiocarcinoma ([name])  08/18/2023 Initial Diagnosis   Cholangiocarcinoma metastatic to liver ([name])   08/26/2023 Cancer Staging   Staging form: Intrahepatic Bile Duct, AJCC 8th Edition - Clinical: Stage II (cT2, cN0, cM0) - Signed by Autumn Millman, MD on 09/15/2023   09/28/2023 -  Chemotherapy   Patient is on Treatment Plan : BILIARY TRACT Cisplatin  + Gemcitabine  D1,8 q21d         REVIEW OF SYSTEMS:   Constitutional: Denies fevers or chills. Decreased appetite. Has had 7 pound weight loss since 09/15/2023.  Eyes: Denies blurriness of vision Ears, nose, mouth, throat, and face: Denies mucositis or sore throat Respiratory: Denies cough, dyspnea or wheezes Cardiovascular: Denies palpitation, chest discomfort or lower extremity swelling Gastrointestinal:  Denies nausea, heartburn or change in bowel habits Skin: Denies abnormal skin rashes Lymphatics: Denies new lymphadenopathy or easy bruising Neurological:Denies numbness, tingling or new weaknesses Behavioral/Psych: Mood is stable, no new changes  All other systems were reviewed with the patient and are negative.   VITALS:   Today's Vitals   10/02/23 1555  BP: (!) 157/79  Pulse: 84  Resp: 17  Temp: 98.4 F (36.9 C)  SpO2: 100%  Weight: 151 lb (68.5 kg)   Body mass index is 19.39 kg/m.   Wt Readings from Last 3 Encounters:  10/02/23 151 lb (68.5 kg)  09/15/23 158 lb 3.2 oz (71.8 kg)  09/14/23 160 lb (72.6 kg)    Body mass index is 19.39 kg/m.  Performance status (ECOG): 1 - Symptomatic but completely ambulatory  PHYSICAL EXAM:   GENERAL:alert, no distress and comfortable SKIN: skin color, texture, turgor are normal, no rashes or significant lesions EYES: normal, Conjunctiva are pink and non-injected, sclera clear OROPHARYNX:no exudate, no erythema and lips, buccal mucosa, and tongue normal  NECK: supple, thyroid  normal size,  non-tender, without nodularity LYMPH:  no palpable lymphadenopathy in the cervical, axillary or inguinal LUNGS: clear to auscultation and percussion with normal breathing effort HEART: regular rate & rhythm and no murmurs and no lower extremity edema ABDOMEN:abdomen soft, non-tender and normal bowel sounds Musculoskeletal:no cyanosis of digits and no clubbing  NEURO: alert & oriented x 3 with fluent speech, no focal motor/sensory deficits  LABORATORY DATA:  I have reviewed the data as listed    Component Value Date/Time   NA 141 09/22/2023 1106   K 4.3 09/22/2023 1106   CL 107 09/22/2023 1106   CO2 30 09/22/2023 1106   GLUCOSE 117 (H) 09/22/2023 1106   BUN 21 (H) 09/22/2023 1106   CREATININE 0.86 09/22/2023 1106   CALCIUM 8.7 (L) 09/22/2023 1106   PROT 6.1 (L) 09/22/2023 1106   ALBUMIN 3.4 (L) 09/22/2023 1106   AST 110 (H) 09/22/2023 1106   ALT 172 (H) 09/22/2023 1106   ALKPHOS 241 (H) 09/22/2023 1106   BILITOT 0.4 09/22/2023 1106   GFRNONAA >60 09/22/2023 1106   GFRAA >60 01/18/2019 0830  Lab Results  Component Value Date   WBC 12.8 (H) 09/22/2023   NEUTROABS 10.6 (H) 09/22/2023   HGB 12.3 (L) 09/22/2023   HCT 38.0 (L) 09/22/2023   MCV 94.5 09/22/2023   PLT 247 09/22/2023     RADIOGRAPHIC STUDIES: IR IMAGING GUIDED PORT INSERTION Result Date: 09/14/2023 CLINICAL DATA:  Metastatic adenocarcinoma, needs durable venous access for planned treatment regimen EXAM: TUNNELED PORT CATHETER PLACEMENT WITH ULTRASOUND AND FLUOROSCOPIC GUIDANCE FLUOROSCOPY: Radiation Exposure Index (as provided by the fluoroscopic device): 1 mGy air Kerma ANESTHESIA/SEDATION: Intravenous Fentanyl  100mcg and Versed  2mg  were administered by RN during a total moderate (conscious) sedation time of 12 minutes; the patient's level of consciousness and physiological / cardiorespiratory status were monitored continuously by radiology RN under my direct supervision. TECHNIQUE: The procedure, risks,  benefits, and alternatives were explained to the patient. Questions regarding the procedure were encouraged and answered. The patient understands and consents to the procedure. Patency of the right IJ vein was confirmed with ultrasound with image documentation. An appropriate skin site was determined. Skin site was marked. Region was prepped using maximum barrier technique including cap and mask, sterile gown, sterile gloves, large sterile sheet, and Chlorhexidine  as cutaneous antisepsis. The region was infiltrated locally with 1% lidocaine . Under real-time ultrasound guidance, the right IJ vein was accessed with a 21 gauge micropuncture needle; the needle tip within the vein was confirmed with ultrasound image documentation. Needle was exchanged over a 018 guidewire for transitional dilator, and vascular measurement was performed. A small incision was made on the right anterior chest wall and a subcutaneous pocket fashioned. The power-injectable port was positioned and its catheter tunneled to the right IJ dermatotomy site. The transitional dilator was exchanged over an Amplatz wire for a peel-away sheath, through which the port catheter, which had been trimmed to the appropriate length, was advanced and positioned under fluoroscopy with its tip at the cavoatrial junction. Spot chest radiograph confirms good catheter position and no pneumothorax. The port was flushed per protocol. The pocket was closed with deep interrupted and subcuticular continuous 3-0 Monocryl sutures. The incisions were covered with Dermabond then covered with a sterile dressing. The patient tolerated the procedure well. COMPLICATIONS: COMPLICATIONS None immediate IMPRESSION: Technically successful right IJ power-injectable port catheter placement. Ready for routine use. Electronically Signed   By: JONETTA Faes M.D.   On: 09/14/2023 12:47   NM PET Image Initial (PI) Skull Base To Thigh Result Date: 09/13/2023 CLINICAL DATA:  Initial treatment  strategy for biliary adenocarcinoma EXAM: NUCLEAR MEDICINE PET SKULL BASE TO THIGH TECHNIQUE: 7.54 mCi F-18 FDG was injected intravenously. Full-ring PET imaging was performed from the skull base to thigh after the radiotracer. CT data was obtained and used for attenuation correction and anatomic localization. Fasting blood glucose: 124 mg/dl COMPARISON:  CT 93/78/7974 FINDINGS: Mediastinal blood pool activity: SUV max 2.2 Liver activity: SUV max 2.4 NECK: No specific abnormal uptake seen in the neck including along lymph node change of the submandibular, posterior triangle or internal jugular region. Near symmetric uptake of the intracranial compartment included in the imaging field. Incidental CT findings: The parotid glands, submandibular glands unremarkable. Small thyroid  gland. Streak artifact related to the patient's dental hardware. There is some mild opacity along the right maxillary paranasal sinus without uptake. Mastoid air cells are grossly clear. Mild scattered vascular calcifications in the neck. CHEST: No abnormal uptake above blood pool in the axillary regions, hilum or mediastinum. No abnormal pulmonary uptake. Incidental CT findings: Slightly patulous thoracic esophagus.  The heart is nonenlarged. No pericardial effusion. The thoracic aorta is normal course and caliber with some calcified plaque. There is some linear opacity lung bases likely scar or atelectasis. ABDOMEN/PELVIS: The dome right hepatic lobe posterior complex lesion seen on previous imaging is again identified. This has some marginal heterogeneous uptake. Most intense areas lateral inferior with maximum SUV of 4.5. Lesion has some small calcifications. Dimension measured on CT image 104 series 4 today measuring 4.9 by 3.6 cm. Please correlate for exact location of neoplasm. There is some adjacent cystic foci identified as well. There is also some vague low-attenuation foci in segment 4A and 2. Example on image 109 of series 4 the CT  today measures 13 mm. This area is new from previous MRI. Additional focus on image 101 measures 10 mm. These areas do not clearly show abnormal radiotracer uptake at this time but with the change would have a differential. Otherwise there is physiologic distribution radiotracer on the other parenchymal organs, bowel and renal collecting systems. No areas of abnormal nodal uptake. There is 1 area of asymmetric uptake along the anal region with some nodular areas and extraluminal air such as series 4, image 207. Uptake in this location of 10.2. Incidental CT findings: Scattered vascular calcifications. Normal caliber aorta and IVC. No renal or ureteral stones. Preserved contour to the urinary bladder. Scattered colonic stool. Left-sided colonic diverticula. Small bowel is nondilated. SKELETON: No specific abnormal uptake identified along the visualized osseous structures. Incidental CT findings: Curvature of the spine. Scattered degenerative changes. IMPRESSION: Marginal areas of uptake seen along the mass lesion in the posterosuperior right hepatic lobe correspond to the area of known neoplasm. There are some small cystic areas elsewhere in the liver. There is a new small low-density focus in the dome left hepatic lobe. No appreciable abnormal uptake but with the change would recommend further workup such as a follow up MRI to confirm etiology. No areas of abnormal uptake seen above the diaphragm. No areas of abnormal nodal uptake identified this time. Heterogeneous uptake with some extraluminal nodularity and gas at the level of the anal canal on the left side. Based on appearance would have a differential including a anal fistula. Neoplasm based on the CT appearance is felt to be less likely. Please correlate clinical presentation. If needed dedicated anal fistula MRI protocol could be obtained. Electronically Signed   By: Ranell Bring M.D.   On: 09/13/2023 12:49

## 2023-10-02 ENCOUNTER — Encounter: Payer: Self-pay | Admitting: Nurse Practitioner

## 2023-10-02 ENCOUNTER — Encounter: Payer: Self-pay | Admitting: Oncology

## 2023-10-02 NOTE — Assessment & Plan Note (Signed)
 Intrahepatic cholangiocarcinoma, diagnosed following MRI and liver biopsy in May 2025. He underwent ultrasound-guided biopsy of liver lesion on 07/29/2023.  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 CDX-2 positive, CK20  positive, CK7 positive, synaptophysin negative, chromogranin negative.  IHC profile suggested a gastrointestinal or pancreaticobiliary primary. To look for a primary, patient underwent upper endoscopy and colonoscopy on 08/18/2023, by Dr. Avram.  EGD showed evidence of gastritis, single gastric polyp.  Otherwise unremarkable.  Colonoscopy showed several polyps in the transverse colon, sigmoid colon, descending colon.  Evidence of diverticulosis in the sigmoid colon.  Multiple biopsies were obtained.  Biopsies from the stomach, colon, cecum all showed benign findings.  No evidence of malignancy noted. Clinical picture is consistent with intrahepatic cholangiocarcinoma, primarily located in the right lobe of the liver with disease limited to the liver area. A new small lesion on the dome of the left side of the liver noted on PET scan and also recent CT scan.  Too small to be biopsied.  Will have to monitor this on future scans.  His case was previously discussed in GI multidisciplinary tumor conference.  Dr. Dasie felt that he can be a surgical candidate following neoadjuvant chemotherapy.  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 Given history of autoimmune hepatitis, primary sclerosing cholangitis, he would not be a candidate for immunotherapy.  Hence plan would be to proceed with chemotherapy using cisplatin  and gemcitabine . Request previously submitted for NGS testing on the specimen and liquid biopsy. - Pending clearance from Dr. Debby following drainage of pelvic/anal abscess, we will plan to administer  chemotherapy with cisplatin  and gemcitabine  once weekly for two weeks on, one week off, depending on tolerance.  He will have chemo education session next week. -He has had chronic C. difficile infection and he thinks the infection may have come back.  He will provide stool sample for testing, hopefully tomorrow.  Sample was positive for C. difficile.  He completed antibiotics yesterday, 09/27/2023.  He does have fidaxomicin  available and started taking it.  Will have to ensure that he is free of symptoms before we can consider chemotherapy initiation. - Monitor kidney function and ensure adequate hydration to prevent kidney dysfunction. - Monitor blood counts, liver function, and for side effects such as nausea, fatigue, hearing loss, and neuropathy. - Refer to Dr.Shelby Dasie for surgical evaluation post-chemotherapy. - Monitor CA 19-9 and CEA levels monthly.   - 09/28/2023 -received with cycle 1 day 1 chemotherapy with cisplatin  and gemcitabine 

## 2023-10-02 NOTE — Progress Notes (Unsigned)
 Patient Care Team: [name] Carlin Redbird, MD as PCP - General (Family Medicine) [name] Lupita BRAVO, MD as Attending Physician (Gastroenterology) [name] Carlin BRAVO, MD (Inactive) as Attending Physician (Vascular Surgery) [name] Evalene CROME, RN as Oncology Nurse Navigator [name] Millman, MD as Consulting Physician (Hematology and Oncology)  Clinic Day:  10/05/2023  Referring physician: Autumn Millman, MD  ASSESSMENT & PLAN:   Assessment & Plan: Intrahepatic cholangiocarcinoma [name]) Intrahepatic cholangiocarcinoma, diagnosed following MRI and liver biopsy in May 2025. He underwent ultrasound-guided biopsy of liver lesion on 07/29/2023.  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 CDX-2 positive, CK20  positive, CK7 positive, synaptophysin negative, chromogranin negative.  IHC profile suggested a gastrointestinal or pancreaticobiliary primary. To look for a primary, patient underwent upper endoscopy and colonoscopy on 08/18/2023, by Dr. Avram.  EGD showed evidence of gastritis, single gastric polyp.  Otherwise unremarkable.  Colonoscopy showed several polyps in the transverse colon, sigmoid colon, descending colon.  Evidence of diverticulosis in the sigmoid colon.  Multiple biopsies were obtained.  Biopsies from the stomach, colon, cecum all showed benign findings.  No evidence of malignancy noted. Clinical picture is consistent with intrahepatic cholangiocarcinoma, primarily located in the right lobe of the liver with disease limited to the liver area. A new small lesion on the dome of the left side of the liver noted on PET scan and also recent CT scan.  Too small to be biopsied.  Will have to monitor this on future scans.  His case was previously discussed in GI multidisciplinary tumor conference.  Dr. Dasie felt that he can be a surgical candidate following neoadjuvant chemotherapy.  The treatment plan is influenced by primary  sclerosing cholangitis and Crohn's disease, complicating the risk-benefit analysis of chemotherapy. Given history of autoimmune hepatitis, primary sclerosing cholangitis, he would not be a candidate for immunotherapy.  Hence plan would be to proceed with chemotherapy using cisplatin  and gemcitabine . Request previously submitted for NGS testing on the specimen and liquid biopsy. - Pending clearance from Dr. Debby following drainage of pelvic/anal abscess, we will plan to administer chemotherapy with cisplatin  and gemcitabine  once weekly for two weeks on, one week off, depending on tolerance.  He will have chemo education session next week. -He has had chronic C. difficile infection and he thinks the infection may have come back.  He will provide stool sample for testing, hopefully tomorrow.  Sample was positive for C. difficile.  He completed antibiotics yesterday, 09/27/2023.  He does have fidaxomicin  available and started taking it.  Will have to ensure that he is free of symptoms before we can consider chemotherapy initiation. - Monitor kidney function and ensure adequate hydration to prevent kidney dysfunction. - Monitor blood counts, liver function, and for side effects such as nausea, fatigue, hearing loss, and neuropathy. - Refer to Dr.Shelby [name] for surgical evaluation post-chemotherapy. - Monitor CA 19-9 and CEA levels monthly.   - 09/28/2023 - presented for cycle 1 day 1 chemotherapy with cisplatin  and gemcitabine .   -10/03/2023 - tolerated initial chemotherapy treatment very well with minimal side effects.  He had mild headache and tingling in fingers which lasted for few hours and resolved on its own.  His appetite is good.  He denies nausea, vomiting, or diarrhea.    Elevated liver functions Patient received initial dose cisplatin  and gemcitabine  on 09/28/2023.  AST down to 101 from 110; ALT up to 224 from 17; ALKP down to 194 from 241.  Patient reports clinically feeling very  well.  Denies  abdominal pain, nausea, vomiting, or diarrhea.  His appetite is good.  He is scheduled for cycle 1 day 8 cisplatin  and gemcitabine  on 10/06/2023.  Discussed increased AST with Dr. Lanny.  He will be okay to proceed with treatment due to improved clinical presentation.  Leukocytosis WBC improved at 12.0 and ANC 11.2.  This continues to improve following treatment for C. difficile and multiple autoimmune conditions including sclerosing cholangitis and autoimmune hepatitis.  Will continue to monitor these counts prior to each round of chemotherapy and adjust dosing as indicated.  Anemia Stable hemoglobin and hematocrit.  Will monitor prior to each round of chemotherapy and adjust dosing as indicated.  Plan Labs reviewed. - Improved WBC and ANC.  Chronic but stable anemia. - CMP showing improved AST and ALKP with more elevated ALT. Discussed with Dr. Lanny. Ok to proceed with Cycle 1 day 8 on 10/06/2023.  Clinically, patient improved since receiving cycle 1 day 1 chemotherapy cisplatin  and gemcitabine .  Other than ALT, labs have improved. Okay to proceed with cycle 1 day 8 chemotherapy cisplatin  and gemcitabine  on 10/06/2023. Labs/flush, follow-up, and cycle 2 day 1 on 10/19/2023 as scheduled.   The patient understands the plans discussed today and is in agreement with them.  He knows to contact our office if he develops concerns prior to his next appointment.  I provided 25 minutes of face-to-face time during this encounter and > 50% was spent counseling as documented under my assessment and plan.    [name] FORBES Lessen, NP  [facility] - A DEPT OF [facility] [address] [place] [address] Dept: [phone] Dept Fax: [phone]   No orders of the defined types were placed in this encounter.     CHIEF COMPLAINT:  CC: Intrahepatic cholangiocarcinoma  Current Treatment: Cisplatin  and gemcitabine  on days 1 and 8 of 21-day  cycle  INTERVAL HISTORY:  [name] is here today for repeat clinical assessment.  He was last seen by me on 09/28/2023.  He received cycle 1 day 1 of cisplatin  and gemcitabine .  10/06/2023 is day 8 of cycle 1 cisplatin  therapy.  He reports doing surprisingly well with initial treatment of chemotherapy.  He said on the day of treatment, he had a little headache and a little tingling in his fingers.  These lasted for few hours and then resolved.  He denies any new or different concerns or complaints.  He  denies chest pain, chest pressure, or shortness of breath. He denies headaches or visual disturbances. He denies abdominal pain, nausea, vomiting, or changes in bowel or bladder habits, specifically, he has not had any diarrhea.  He denies fevers or chills. He denies pain. His appetite is good. His weight has increased 8 pounds over last week.  I have reviewed the past medical history, past surgical history, social history and family history with the patient and they are unchanged from previous note.  ALLERGIES:  is allergic to tape, asacol  [mesalamine ], azathioprine, gluten meal, metoprolol  tartrate, mycophenolate mofetil, other, wheat, abilify  [aripiprazole ], and amoxicillin .  MEDICATIONS:  Current Outpatient Medications  Medication Sig Dispense Refill   Cholecalciferol 50 MCG (2000 UT) CAPS Take 1 capsule by mouth daily.     dexamethasone  (DECADRON ) 4 MG tablet Take 2 tablets (8 mg) by mouth daily x 3 days starting the day after cisplatin  chemotherapy. Take with food. 30 tablet 1   ferrous sulfate  325 (65 FE) MG tablet  Take 325 mg by mouth daily with breakfast. Continues to take per low HGB     fidaxomicin  (DIFICID ) 200 MG TABS tablet Take 1 tablet (200 mg total) by mouth 2 (two) times daily. 20 tablet 0   hydrOXYzine  (ATARAX ) 25 MG tablet Take 1 tablet (25 mg total) by mouth 3 (three) times daily as needed for anxiety. 30 tablet 0   lidocaine -prilocaine  (EMLA ) cream Apply to affected area once 30 g 3    mirtazapine  (REMERON ) 30 MG tablet Take 0.5 tablets (15 mg total) by mouth at bedtime.     Nutritional Supplements (ENSURE HIGH PROTEIN) LIQD Take 237 mLs by mouth in the morning and at bedtime.     Potassium 99 MG TABS Take 1 tablet by mouth daily.     predniSONE  (DELTASONE ) 20 MG tablet Take 20 mg by mouth daily with breakfast.     Vedolizumab  (ENTYVIO  IV) Inject into the vein every 6 (six) weeks.     venlafaxine  XR (EFFEXOR -XR) 150 MG 24 hr capsule Take 1 capsule (150 mg total) by mouth daily with breakfast. 30 capsule 0   venlafaxine  XR (EFFEXOR -XR) 75 MG 24 hr capsule Take 75 mg by mouth daily.     ondansetron  (ZOFRAN ) 8 MG tablet Take 1 tablet (8 mg total) by mouth every 8 (eight) hours as needed for nausea or vomiting. Start on the third day after cisplatin . (Patient not taking: Reported on 10/03/2023) 30 tablet 1   prochlorperazine  (COMPAZINE ) 10 MG tablet Take 1 tablet (10 mg total) by mouth every 6 (six) hours as needed (Nausea or vomiting). (Patient not taking: Reported on 10/03/2023) 30 tablet 1   No current facility-administered medications for this visit.    HISTORY OF PRESENT ILLNESS:   Oncology History  Intrahepatic cholangiocarcinoma ([name])  08/18/2023 Initial Diagnosis   Cholangiocarcinoma metastatic to liver ([name])   08/26/2023 Cancer Staging   Staging form: Intrahepatic Bile Duct, AJCC 8th Edition - Clinical: Stage II (cT2, cN0, cM0) - Signed by [name] Millman, MD on 09/15/2023   09/28/2023 -  Chemotherapy   Patient is on Treatment Plan : BILIARY TRACT Cisplatin  + Gemcitabine  D1,8 q21d         REVIEW OF SYSTEMS:   Constitutional: Denies fevers, chills or abnormal weight loss.  Appetite is good. Eyes: Denies blurriness of vision Ears, nose, mouth, throat, and face: Denies mucositis or sore throat Respiratory: Denies cough, dyspnea or wheezes Cardiovascular: Denies palpitation, chest discomfort or lower extremity swelling Gastrointestinal:  Denies nausea, heartburn or  change in bowel habits.  Denies diarrhea. Skin: Denies abnormal skin rashes Lymphatics: Denies new lymphadenopathy or easy bruising Neurological:Denies numbness, tingling or new weaknesses Behavioral/Psych: Mood is stable, no new changes  All other systems were reviewed with the patient and are negative.   VITALS:   Today's Vitals   10/03/23 0913 10/03/23 0915  BP: 134/76   Pulse: 76   Resp: 17   Temp: 98.6 F (37 C)   TempSrc: Oral   SpO2: 100%   Weight: 166 lb 8 oz (75.5 kg)   Height: 6' 1.5 (1.867 m)   PainSc:  0-No pain   Body mass index is 21.67 kg/m.   Wt Readings from Last 3 Encounters:  10/03/23 166 lb 8 oz (75.5 kg)  10/02/23 151 lb (68.5 kg)  09/15/23 158 lb 3.2 oz (71.8 kg)    Body mass index is 21.67 kg/m.  Performance status (ECOG): 1 - Symptomatic but completely ambulatory  PHYSICAL EXAM:   GENERAL:alert, no distress  and comfortable SKIN: skin color, texture, turgor are normal, no rashes or significant lesions EYES: normal, Conjunctiva are pink and non-injected, sclera clear OROPHARYNX:no exudate, no erythema and lips, buccal mucosa, and tongue normal  NECK: supple, thyroid  normal size, non-tender, without nodularity LYMPH:  no palpable lymphadenopathy in the cervical, axillary or inguinal LUNGS: clear to auscultation and percussion with normal breathing effort HEART: regular rate & rhythm and no murmurs and no lower extremity edema ABDOMEN:abdomen soft, non-tender and normal bowel sounds Musculoskeletal:no cyanosis of digits and no clubbing  NEURO: alert & oriented x 3 with fluent speech, no focal motor/sensory deficits  LABORATORY DATA:  I have reviewed the data as listed    Component Value Date/Time   NA 138 10/03/2023 0845   K 4.0 10/03/2023 0845   CL 104 10/03/2023 0845   CO2 30 10/03/2023 0845   GLUCOSE 113 (H) 10/03/2023 0845   BUN 29 (H) 10/03/2023 0845   CREATININE 0.95 10/03/2023 0845   CALCIUM 8.5 (L) 10/03/2023 0845   PROT 5.7  (L) 10/03/2023 0845   ALBUMIN 3.4 (L) 10/03/2023 0845   AST 101 (H) 10/03/2023 0845   ALT 224 (H) 10/03/2023 0845   ALKPHOS 194 (H) 10/03/2023 0845   BILITOT 0.4 10/03/2023 0845   GFRNONAA >60 10/03/2023 0845   GFRAA >60 01/18/2019 0830     Lab Results  Component Value Date   WBC 12.0 (H) 10/03/2023   NEUTROABS 11.2 (H) 10/03/2023   HGB 12.4 (L) 10/03/2023   HCT 37.7 (L) 10/03/2023   MCV 93.1 10/03/2023   PLT 203 10/03/2023    RADIOGRAPHIC STUDIES: IR IMAGING GUIDED PORT INSERTION Result Date: 09/14/2023 CLINICAL DATA:  Metastatic adenocarcinoma, needs durable venous access for planned treatment regimen EXAM: TUNNELED PORT CATHETER PLACEMENT WITH ULTRASOUND AND FLUOROSCOPIC GUIDANCE FLUOROSCOPY: Radiation Exposure Index (as provided by the fluoroscopic device): 1 mGy air Kerma ANESTHESIA/SEDATION: Intravenous Fentanyl  100mcg and Versed  2mg  were administered by RN during a total moderate (conscious) sedation time of 12 minutes; the patient's level of consciousness and physiological / cardiorespiratory status were monitored continuously by radiology RN under my direct supervision. TECHNIQUE: The procedure, risks, benefits, and alternatives were explained to the patient. Questions regarding the procedure were encouraged and answered. The patient understands and consents to the procedure. Patency of the right IJ vein was confirmed with ultrasound with image documentation. An appropriate skin site was determined. Skin site was marked. Region was prepped using maximum barrier technique including cap and mask, sterile gown, sterile gloves, large sterile sheet, and Chlorhexidine  as cutaneous antisepsis. The region was infiltrated locally with 1% lidocaine . Under real-time ultrasound guidance, the right IJ vein was accessed with a 21 gauge micropuncture needle; the needle tip within the vein was confirmed with ultrasound image documentation. Needle was exchanged over a 018 guidewire for transitional  dilator, and vascular measurement was performed. A small incision was made on the right anterior chest wall and a subcutaneous pocket fashioned. The power-injectable port was positioned and its catheter tunneled to the right IJ dermatotomy site. The transitional dilator was exchanged over an Amplatz wire for a peel-away sheath, through which the port catheter, which had been trimmed to the appropriate length, was advanced and positioned under fluoroscopy with its tip at the cavoatrial junction. Spot chest radiograph confirms good catheter position and no pneumothorax. The port was flushed per protocol. The pocket was closed with deep interrupted and subcuticular continuous 3-0 Monocryl sutures. The incisions were covered with Dermabond then covered with a sterile dressing. The  patient tolerated the procedure well. COMPLICATIONS: COMPLICATIONS None immediate IMPRESSION: Technically successful right IJ power-injectable port catheter placement. Ready for routine use. Electronically Signed   By: JONETTA Faes M.D.   On: 09/14/2023 12:47   NM PET Image Initial (PI) Skull Base To Thigh Result Date: 09/13/2023 CLINICAL DATA:  Initial treatment strategy for biliary adenocarcinoma EXAM: NUCLEAR MEDICINE PET SKULL BASE TO THIGH TECHNIQUE: 7.54 mCi F-18 FDG was injected intravenously. Full-ring PET imaging was performed from the skull base to thigh after the radiotracer. CT data was obtained and used for attenuation correction and anatomic localization. Fasting blood glucose: 124 mg/dl COMPARISON:  CT 93/78/7974 FINDINGS: Mediastinal blood pool activity: SUV max 2.2 Liver activity: SUV max 2.4 NECK: No specific abnormal uptake seen in the neck including along lymph node change of the submandibular, posterior triangle or internal jugular region. Near symmetric uptake of the intracranial compartment included in the imaging field. Incidental CT findings: The parotid glands, submandibular glands unremarkable. Small thyroid  gland.  Streak artifact related to the patient's dental hardware. There is some mild opacity along the right maxillary paranasal sinus without uptake. Mastoid air cells are grossly clear. Mild scattered vascular calcifications in the neck. CHEST: No abnormal uptake above blood pool in the axillary regions, hilum or mediastinum. No abnormal pulmonary uptake. Incidental CT findings: Slightly patulous thoracic esophagus. The heart is nonenlarged. No pericardial effusion. The thoracic aorta is normal course and caliber with some calcified plaque. There is some linear opacity lung bases likely scar or atelectasis. ABDOMEN/PELVIS: The dome right hepatic lobe posterior complex lesion seen on previous imaging is again identified. This has some marginal heterogeneous uptake. Most intense areas lateral inferior with maximum SUV of 4.5. Lesion has some small calcifications. Dimension measured on CT image 104 series 4 today measuring 4.9 by 3.6 cm. Please correlate for exact location of neoplasm. There is some adjacent cystic foci identified as well. There is also some vague low-attenuation foci in segment 4A and 2. Example on image 109 of series 4 the CT today measures 13 mm. This area is new from previous MRI. Additional focus on image 101 measures 10 mm. These areas do not clearly show abnormal radiotracer uptake at this time but with the change would have a differential. Otherwise there is physiologic distribution radiotracer on the other parenchymal organs, bowel and renal collecting systems. No areas of abnormal nodal uptake. There is 1 area of asymmetric uptake along the anal region with some nodular areas and extraluminal air such as series 4, image 207. Uptake in this location of 10.2. Incidental CT findings: Scattered vascular calcifications. Normal caliber aorta and IVC. No renal or ureteral stones. Preserved contour to the urinary bladder. Scattered colonic stool. Left-sided colonic diverticula. Small bowel is nondilated.  SKELETON: No specific abnormal uptake identified along the visualized osseous structures. Incidental CT findings: Curvature of the spine. Scattered degenerative changes. IMPRESSION: Marginal areas of uptake seen along the mass lesion in the posterosuperior right hepatic lobe correspond to the area of known neoplasm. There are some small cystic areas elsewhere in the liver. There is a new small low-density focus in the dome left hepatic lobe. No appreciable abnormal uptake but with the change would recommend further workup such as a follow up MRI to confirm etiology. No areas of abnormal uptake seen above the diaphragm. No areas of abnormal nodal uptake identified this time. Heterogeneous uptake with some extraluminal nodularity and gas at the level of the anal canal on the left side. Based on appearance  would have a differential including a anal fistula. Neoplasm based on the CT appearance is felt to be less likely. Please correlate clinical presentation. If needed dedicated anal fistula MRI protocol could be obtained. Electronically Signed   By: Ranell Bring M.D.   On: 09/13/2023 12:49

## 2023-10-03 ENCOUNTER — Inpatient Hospital Stay

## 2023-10-03 ENCOUNTER — Inpatient Hospital Stay: Attending: Oncology | Admitting: Nurse Practitioner

## 2023-10-03 VITALS — BP 134/76 | HR 76 | Temp 98.6°F | Resp 17 | Ht 73.5 in | Wt 166.5 lb

## 2023-10-03 DIAGNOSIS — C221 Intrahepatic bile duct carcinoma: Secondary | ICD-10-CM | POA: Insufficient documentation

## 2023-10-03 DIAGNOSIS — Z5111 Encounter for antineoplastic chemotherapy: Secondary | ICD-10-CM | POA: Diagnosis not present

## 2023-10-03 DIAGNOSIS — Z79899 Other long term (current) drug therapy: Secondary | ICD-10-CM | POA: Diagnosis not present

## 2023-10-03 DIAGNOSIS — Z95828 Presence of other vascular implants and grafts: Secondary | ICD-10-CM

## 2023-10-03 DIAGNOSIS — Z5189 Encounter for other specified aftercare: Secondary | ICD-10-CM | POA: Diagnosis not present

## 2023-10-03 LAB — CBC WITH DIFFERENTIAL (CANCER CENTER ONLY)
Abs Immature Granulocytes: 0.08 K/uL — ABNORMAL HIGH (ref 0.00–0.07)
Basophils Absolute: 0 K/uL (ref 0.0–0.1)
Basophils Relative: 0 %
Eosinophils Absolute: 0 K/uL (ref 0.0–0.5)
Eosinophils Relative: 0 %
HCT: 37.7 % — ABNORMAL LOW (ref 39.0–52.0)
Hemoglobin: 12.4 g/dL — ABNORMAL LOW (ref 13.0–17.0)
Immature Granulocytes: 1 %
Lymphocytes Relative: 4 %
Lymphs Abs: 0.5 K/uL — ABNORMAL LOW (ref 0.7–4.0)
MCH: 30.6 pg (ref 26.0–34.0)
MCHC: 32.9 g/dL (ref 30.0–36.0)
MCV: 93.1 fL (ref 80.0–100.0)
Monocytes Absolute: 0.1 K/uL (ref 0.1–1.0)
Monocytes Relative: 1 %
Neutro Abs: 11.2 K/uL — ABNORMAL HIGH (ref 1.7–7.7)
Neutrophils Relative %: 94 %
Platelet Count: 203 K/uL (ref 150–400)
RBC: 4.05 MIL/uL — ABNORMAL LOW (ref 4.22–5.81)
RDW: 15.7 % — ABNORMAL HIGH (ref 11.5–15.5)
WBC Count: 12 K/uL — ABNORMAL HIGH (ref 4.0–10.5)
nRBC: 0 % (ref 0.0–0.2)

## 2023-10-03 LAB — CMP (CANCER CENTER ONLY)
ALT: 224 U/L — ABNORMAL HIGH (ref 0–44)
AST: 101 U/L — ABNORMAL HIGH (ref 15–41)
Albumin: 3.4 g/dL — ABNORMAL LOW (ref 3.5–5.0)
Alkaline Phosphatase: 194 U/L — ABNORMAL HIGH (ref 38–126)
Anion gap: 4 — ABNORMAL LOW (ref 5–15)
BUN: 29 mg/dL — ABNORMAL HIGH (ref 6–20)
CO2: 30 mmol/L (ref 22–32)
Calcium: 8.5 mg/dL — ABNORMAL LOW (ref 8.9–10.3)
Chloride: 104 mmol/L (ref 98–111)
Creatinine: 0.95 mg/dL (ref 0.61–1.24)
GFR, Estimated: >60 mL/min (ref >60)
Glucose, Bld: 113 mg/dL — ABNORMAL HIGH (ref 70–99)
Potassium: 4 mmol/L (ref 3.5–5.1)
Sodium: 138 mmol/L (ref 135–145)
Total Bilirubin: 0.4 mg/dL (ref 0.0–1.2)
Total Protein: 5.7 g/dL — ABNORMAL LOW (ref 6.5–8.1)

## 2023-10-03 LAB — MAGNESIUM: Magnesium: 1.9 mg/dL (ref 1.7–2.4)

## 2023-10-03 MED ORDER — SODIUM CHLORIDE 0.9% FLUSH
10.0000 mL | Freq: Once | INTRAVENOUS | Status: AC
Start: 2023-10-03 — End: 2023-10-03
  Administered 2023-10-03: 10 mL

## 2023-10-05 ENCOUNTER — Encounter: Payer: Self-pay | Admitting: Oncology

## 2023-10-05 ENCOUNTER — Telehealth: Payer: Self-pay

## 2023-10-05 ENCOUNTER — Encounter: Payer: Self-pay | Admitting: Nurse Practitioner

## 2023-10-05 NOTE — Telephone Encounter (Signed)
 Called patient to let him know we are moving his appt time to 1000 so that he can be put in our bedrooms due to C Ddiff diagnosis from 7/19. He has been on 2 courses of antibiotics since initial diagnosis and he states that he has not had any loose stools since 7/19. Advised him that we have reached out to infection prevention to see if we still need to treat him with precautions. He understands and was fine with the time change to 1000. Andrea CHRISTELLA Plunk, RN

## 2023-10-06 ENCOUNTER — Inpatient Hospital Stay

## 2023-10-06 ENCOUNTER — Other Ambulatory Visit

## 2023-10-06 VITALS — BP 150/90 | HR 84 | Temp 98.2°F | Resp 16

## 2023-10-06 DIAGNOSIS — Z5111 Encounter for antineoplastic chemotherapy: Secondary | ICD-10-CM | POA: Diagnosis not present

## 2023-10-06 DIAGNOSIS — C221 Intrahepatic bile duct carcinoma: Secondary | ICD-10-CM

## 2023-10-06 DIAGNOSIS — Z79899 Other long term (current) drug therapy: Secondary | ICD-10-CM | POA: Diagnosis not present

## 2023-10-06 DIAGNOSIS — Z5189 Encounter for other specified aftercare: Secondary | ICD-10-CM | POA: Diagnosis not present

## 2023-10-06 MED ORDER — POTASSIUM CHLORIDE IN NACL 20-0.9 MEQ/L-% IV SOLN
Freq: Once | INTRAVENOUS | Status: AC
  Filled 2023-10-06: qty 1000

## 2023-10-06 MED ORDER — SODIUM CHLORIDE 0.9 % IV SOLN
25.0000 mg/m2 | Freq: Once | INTRAVENOUS | Status: AC
  Administered 2023-10-06: 50 mg via INTRAVENOUS
  Filled 2023-10-06: qty 50

## 2023-10-06 MED ORDER — SODIUM CHLORIDE 0.9 % IV SOLN
1000.0000 mg/m2 | Freq: Once | INTRAVENOUS | Status: AC
  Administered 2023-10-06: 1938 mg via INTRAVENOUS
  Filled 2023-10-06: qty 50.97

## 2023-10-06 MED ORDER — MAGNESIUM SULFATE 2 GM/50ML IV SOLN
2.0000 g | Freq: Once | INTRAVENOUS | Status: AC
  Administered 2023-10-06: 2 g via INTRAVENOUS
  Filled 2023-10-06: qty 50

## 2023-10-06 MED ORDER — SODIUM CHLORIDE 0.9% FLUSH: 10.0000 mL | INTRAVENOUS | Status: DC | PRN

## 2023-10-06 MED ORDER — PALONOSETRON HCL INJECTION 0.25 MG/5ML
0.2500 mg | Freq: Once | INTRAVENOUS | Status: AC
  Administered 2023-10-06: 0.25 mg via INTRAVENOUS
  Filled 2023-10-06: qty 5

## 2023-10-06 MED ORDER — SODIUM CHLORIDE 0.9 % IV SOLN: INTRAVENOUS | Status: DC

## 2023-10-06 MED ORDER — DEXAMETHASONE SODIUM PHOSPHATE 10 MG/ML IJ SOLN
10.0000 mg | Freq: Once | INTRAMUSCULAR | Status: AC
  Administered 2023-10-06: 10 mg via INTRAVENOUS
  Filled 2023-10-06: qty 1

## 2023-10-06 MED ORDER — APREPITANT 130 MG/18ML IV EMUL
130.0000 mg | Freq: Once | INTRAVENOUS | Status: AC
  Administered 2023-10-06: 130 mg via INTRAVENOUS
  Filled 2023-10-06: qty 18

## 2023-10-06 NOTE — Progress Notes (Signed)
 Per Dr. Lanny on 10/05/23, okay to treat with  AST 101 and ALT 224.

## 2023-10-07 ENCOUNTER — Other Ambulatory Visit: Payer: Self-pay

## 2023-10-08 ENCOUNTER — Inpatient Hospital Stay

## 2023-10-08 VITALS — BP 152/83 | HR 81 | Temp 97.9°F | Resp 15

## 2023-10-08 DIAGNOSIS — Z5111 Encounter for antineoplastic chemotherapy: Secondary | ICD-10-CM | POA: Diagnosis not present

## 2023-10-08 DIAGNOSIS — C221 Intrahepatic bile duct carcinoma: Secondary | ICD-10-CM

## 2023-10-08 DIAGNOSIS — Z5189 Encounter for other specified aftercare: Secondary | ICD-10-CM | POA: Diagnosis not present

## 2023-10-08 DIAGNOSIS — Z79899 Other long term (current) drug therapy: Secondary | ICD-10-CM | POA: Diagnosis not present

## 2023-10-08 MED ORDER — PEGFILGRASTIM-FPGK 6 MG/0.6ML ~~LOC~~ SOSY
6.0000 mg | PREFILLED_SYRINGE | Freq: Once | SUBCUTANEOUS | Status: AC
  Administered 2023-10-08: 6 mg via SUBCUTANEOUS

## 2023-10-08 NOTE — Patient Instructions (Signed)

## 2023-10-10 DIAGNOSIS — M9903 Segmental and somatic dysfunction of lumbar region: Secondary | ICD-10-CM | POA: Diagnosis not present

## 2023-10-11 NOTE — Addendum Note (Signed)
 Encounter addended by: Janice Lynwood BROCKS on: 10/11/2023 10:58 AM  Actions taken: Imaging Exam ended

## 2023-10-17 DIAGNOSIS — K5090 Crohn's disease, unspecified, without complications: Secondary | ICD-10-CM | POA: Diagnosis not present

## 2023-10-19 ENCOUNTER — Encounter: Payer: Self-pay | Admitting: Oncology

## 2023-10-19 ENCOUNTER — Ambulatory Visit (INDEPENDENT_AMBULATORY_CARE_PROVIDER_SITE_OTHER): Admitting: Internal Medicine

## 2023-10-19 ENCOUNTER — Encounter: Payer: Self-pay | Admitting: Internal Medicine

## 2023-10-19 ENCOUNTER — Inpatient Hospital Stay

## 2023-10-19 ENCOUNTER — Inpatient Hospital Stay (HOSPITAL_BASED_OUTPATIENT_CLINIC_OR_DEPARTMENT_OTHER): Admitting: Oncology

## 2023-10-19 ENCOUNTER — Other Ambulatory Visit: Payer: Self-pay

## 2023-10-19 VITALS — BP 110/64 | HR 88 | Ht 73.5 in | Wt 165.0 lb

## 2023-10-19 VITALS — BP 126/70 | HR 85 | Temp 98.5°F | Resp 17 | Wt 166.6 lb

## 2023-10-19 DIAGNOSIS — C221 Intrahepatic bile duct carcinoma: Secondary | ICD-10-CM

## 2023-10-19 DIAGNOSIS — Z79899 Other long term (current) drug therapy: Secondary | ICD-10-CM | POA: Diagnosis not present

## 2023-10-19 DIAGNOSIS — K754 Autoimmune hepatitis: Secondary | ICD-10-CM | POA: Diagnosis not present

## 2023-10-19 DIAGNOSIS — D72829 Elevated white blood cell count, unspecified: Secondary | ICD-10-CM

## 2023-10-19 DIAGNOSIS — Z7952 Long term (current) use of systemic steroids: Secondary | ICD-10-CM

## 2023-10-19 DIAGNOSIS — Z5111 Encounter for antineoplastic chemotherapy: Secondary | ICD-10-CM | POA: Diagnosis not present

## 2023-10-19 DIAGNOSIS — A0472 Enterocolitis due to Clostridium difficile, not specified as recurrent: Secondary | ICD-10-CM

## 2023-10-19 DIAGNOSIS — K603 Anal fistula, unspecified: Secondary | ICD-10-CM

## 2023-10-19 DIAGNOSIS — K50813 Crohn's disease of both small and large intestine with fistula: Secondary | ICD-10-CM | POA: Diagnosis not present

## 2023-10-19 DIAGNOSIS — K8301 Primary sclerosing cholangitis: Secondary | ICD-10-CM | POA: Diagnosis not present

## 2023-10-19 DIAGNOSIS — Z5189 Encounter for other specified aftercare: Secondary | ICD-10-CM | POA: Diagnosis not present

## 2023-10-19 DIAGNOSIS — Z95828 Presence of other vascular implants and grafts: Secondary | ICD-10-CM

## 2023-10-19 DIAGNOSIS — K7460 Unspecified cirrhosis of liver: Secondary | ICD-10-CM

## 2023-10-19 LAB — CBC WITH DIFFERENTIAL (CANCER CENTER ONLY)
Abs Immature Granulocytes: 2.56 K/uL — ABNORMAL HIGH (ref 0.00–0.07)
Basophils Absolute: 0.1 K/uL (ref 0.0–0.1)
Basophils Relative: 0 %
Eosinophils Absolute: 0 K/uL (ref 0.0–0.5)
Eosinophils Relative: 0 %
HCT: 40.2 % (ref 39.0–52.0)
Hemoglobin: 13.2 g/dL (ref 13.0–17.0)
Immature Granulocytes: 10 %
Lymphocytes Relative: 4 %
Lymphs Abs: 1 K/uL (ref 0.7–4.0)
MCH: 30.8 pg (ref 26.0–34.0)
MCHC: 32.8 g/dL (ref 30.0–36.0)
MCV: 93.7 fL (ref 80.0–100.0)
Monocytes Absolute: 1.6 K/uL — ABNORMAL HIGH (ref 0.1–1.0)
Monocytes Relative: 6 %
Neutro Abs: 21.8 K/uL — ABNORMAL HIGH (ref 1.7–7.7)
Neutrophils Relative %: 80 %
Platelet Count: 167 K/uL (ref 150–400)
RBC: 4.29 MIL/uL (ref 4.22–5.81)
RDW: 16.5 % — ABNORMAL HIGH (ref 11.5–15.5)
Smear Review: NORMAL
WBC Count: 27 K/uL — ABNORMAL HIGH (ref 4.0–10.5)
nRBC: 0.1 % (ref 0.0–0.2)

## 2023-10-19 LAB — CMP (CANCER CENTER ONLY)
ALT: 118 U/L — ABNORMAL HIGH (ref 0–44)
AST: 56 U/L — ABNORMAL HIGH (ref 15–41)
Albumin: 3.4 g/dL — ABNORMAL LOW (ref 3.5–5.0)
Alkaline Phosphatase: 273 U/L — ABNORMAL HIGH (ref 38–126)
Anion gap: 4 — ABNORMAL LOW (ref 5–15)
BUN: 24 mg/dL — ABNORMAL HIGH (ref 6–20)
CO2: 30 mmol/L (ref 22–32)
Calcium: 8.7 mg/dL — ABNORMAL LOW (ref 8.9–10.3)
Chloride: 103 mmol/L (ref 98–111)
Creatinine: 0.88 mg/dL (ref 0.61–1.24)
GFR, Estimated: >60 mL/min (ref >60)
Glucose, Bld: 101 mg/dL — ABNORMAL HIGH (ref 70–99)
Potassium: 3.8 mmol/L (ref 3.5–5.1)
Sodium: 137 mmol/L (ref 135–145)
Total Bilirubin: 0.3 mg/dL (ref 0.0–1.2)
Total Protein: 5.7 g/dL — ABNORMAL LOW (ref 6.5–8.1)

## 2023-10-19 LAB — MAGNESIUM: Magnesium: 1.9 mg/dL (ref 1.7–2.4)

## 2023-10-19 MED ORDER — SODIUM CHLORIDE 0.9% FLUSH
10.0000 mL | Freq: Once | INTRAVENOUS | Status: AC
  Administered 2023-10-19: 10 mL

## 2023-10-19 NOTE — Assessment & Plan Note (Addendum)
 Colorectal fistula is healing well with minimal, manageable drainage.

## 2023-10-19 NOTE — Progress Notes (Unsigned)
 [name] 58 y.o. 31-Aug-1965 [id number]  Assessment & Plan:   In a few weeks try to change to 15/20 4 month f/u   Subjective:  Gastroenterology summary:   Crohn's disease of small and large intestine with history of fistula and perianal abscess   COLONOSCOPY/PATH DX 2001, REPEAT 2005 PATH NEG 2012: Active Crohn's right colon - ileum ok Intolerant of immunomodulators, CellCept, also intolerant of AZA though questionable. 2014 Crohn's flare and ischemic colitis Summer 2014 off prednisone  and back on Humira  2015 colonoscopy ileocolonic inflammation no I think improved 08/30/2016 - Entyvio  to start November 2021 vedolizumab  trough 9.4 mcg/dL going to monthly vedolizumab  2022 every 6-week vedolizumab  March 2023 colonoscopy with pseudopolyps/inflammatory polyps but mucosal biopsies were without active inflammatory bowel disease   Status post colorectal surgery care with seton placement in the past for perirectal fistula and abscess   Posterior anal fissure on and off also   Iron  deficiency anemia chronic and recurrent has been treated with parenteral iron  at [facility] where he gets his Entyvio  infusions   Autoimmune hepatitis-PSC overlap with cirrhosis (also history of alcohol abuse)   AbnormalLFT's seen 8/ 2001 Marked elevation of transaminases Feb 2007, alkaline phosphatase 157, coags normal Positive anti-smooth muscle antibody at 1-80 February 2007, F. Acton antibody IgG 57 which is elevated, other serologies and celiac profile negative Liver biopsy May 11, 2005: Mild chronic hepatitis inflammation grade 1, no fibrosis identified, 2+ cirrhosis, prednisone  begun with apparent response, reduction in transaminases. The patient has always had minimal symptomatology Immune to HAV and HBV Treated with prednisone  and azathioprine though azathioprine stopped by patient, trial of Cellcept stopped by patient Liver biopsy #2 August 31, 2006 chronic mildly  active hepatitis with focal portal fibrosis, iron  staining negative, SPEP normal, ANA negative anti-smooth muscle antibody still positive 01/11/07 MR/MRCP right hepatic lobe ducts irregular and beaded consistent with PSC, minimal perihepatic ascites, o/w negative [facility] evaluation, Dr. Alray April and May 2009, MRCP suggests [facility] and raises ? of cholangiocarcinoma vs fibrosis right lobe of liver, CA 19-9 87 (NL <40), follow-up at [facility] or here suggested with repeat liver biospy to be considered. MR earlier 2010 without signs of cholabgiocarcinoma. Transaminase elevations have been fairly asymptomatic and reduction in levels has not always correlated with prednisone  use. MR MRCP 01/08/2011 shows progressive changes of inflammation in the right hepatic lobe and ducts without mass lesion 12/2011 MRCP 1. Slowly progressive hepatic fibrosis secondary to underlying  sclerosing cholangitis. No mass lesion is identified to suggest  cholangiocarcinoma.  2. Stable intrahepatic biliary dilatation and beading of the  common bile duct. Possible tiny gallstones. No evidence of  choledocholithiasis. 12/2012 liver bx CHRONIC HEPATITIS WITH MODERATE ACTIVITY AND BRIDGING FIBROSIS restart prednisone  40 mg daily   July 2021 A. LIVER, LEFT, RANDOM, BIOPSY:  - Features of mild large bile duct obstruction, compatible with history  of primary sclerosing cholangitis. See comment  - Minimal portal fibrosis, Stage 0-1 of 4.    May 2025 intrahepatic cholangiocarcinoma Liver mass-3.7 x 2.8 x 4.3 cm lateral aspect of hepatic dome.  Biopsy show adenocarcinoma with signet ring cell features favoring a metastasis though could be of biliary origin, gastrointestinal or pancreatic possible also.  Also has an enlarging liver cyst and a stable liver cyst. CA 19-9 897     Portal vein thrombosis treated and resolved with anticoagulation though has left atrophic liver segments as above     History of mesenteric ischemia problems SMA  stenosis and briefly went  to hospice at this time as he decided to stop all treatment but recovered    Chronic steroid dependence Has not been able to wean steroids ever, is aware of the potential risks.  In general he can only take 20 mg prednisone  tablets.  Dose ranges from 20 to 30 mg daily.   Chronic opioid use with chronic pain syndrome Years of hydrocodone  5-325 mg 1 each day.  August 2024 stopped due to components of the pill (titanium and silica dioxide) but the patient perceives cause gastrointestinal disturbance.  Tried a formulation of acetaminophen  from compound pharmacy but did not tolerate.  March 2025-will not represcribe hydrocodone .   Chronic recurrent depression with a history of avoidant restrictive food intake disorder and anxiety and previous suicide attempt   C. difficile colitis times 01 Jul 2023 Chief Complaint:  HPI   Discussed the use of AI scribe software for clinical note transcription with the patient, who gave verbal consent to proceed.   [name] is a 58 year old male with cholangiocarcinoma, autoimmune hepatitis/PSC overlap, Crohn's disease of the large and small intestine and recent problems with C. difficile colitis who presents for follow-up.  He is currently undergoing chemotherapy for cholangiocarcinoma and has completed two treatments without experiencing any side effects. He is scheduled for another treatment tomorrow. There is potential for surgery if the tumor shrinks, although he has not yet consulted with the surgeon.  He has a history of Clostridioides difficile infection, which was treated with Dificid . His diarrhea resolved about a month ago and has not recurred. No current abdominal pain or bleeding issues, although he experienced minor bleeding over the weekend, attributed to wiping.  He is taking prednisone  at a dose of 20 mg daily and has been on it for several weeks. He also receives Entyvio  infusions, with the most  recent infusion administered on Monday.  He reports eating well and consuming a lot of food. Recent lab work showed elevated alkaline phosphatase levels, although other enzymes were within normal limits. He notes that his lab values tend to fluctuate.  No pain, bleeding problems, or issues related to chemotherapy side effects. His gastrointestinal symptoms are currently under control.       Wt Readings from Last 3 Encounters:  10/19/23 165 lb (74.8 kg)  10/19/23 166 lb 9.6 oz (75.6 kg)  10/03/23 166 lb 8 oz (75.5 kg)     Allergies  Allergen Reactions   Tape Itching, Rash and Other (See Comments)    Please use paper tape   Asacol  [Mesalamine ] Diarrhea and Other (See Comments)    Abd pain, too   Azathioprine Diarrhea and Other (See Comments)    Abd pain, too   Gluten Meal Diarrhea   Metoprolol  Tartrate Nausea Only and Other (See Comments)    Stomach upset   Mycophenolate Mofetil Diarrhea and Other (See Comments)    Abd pain, too   Other Diarrhea and Other (See Comments)    NUTS   Wheat Diarrhea and Other (See Comments)    Constipation; flatulence; abd pain (can tolerate sometimes)   Abilify  [Aripiprazole ] Diarrhea   Amoxicillin  Other (See Comments)    Lots of gas   Current Meds  Medication Sig   Cholecalciferol 50 MCG (2000 UT) CAPS Take 1 capsule by mouth daily.   dexamethasone  (DECADRON ) 4 MG tablet Take 2 tablets (8 mg) by mouth daily x 3 days starting the day after cisplatin  chemotherapy. Take with food.   ferrous sulfate  325 (65 FE)  MG tablet Take 325 mg by mouth daily with breakfast. Continues to take per low HGB   hydrOXYzine  (ATARAX ) 25 MG tablet Take 1 tablet (25 mg total) by mouth 3 (three) times daily as needed for anxiety.   lidocaine -prilocaine  (EMLA ) cream Apply to affected area once   Magnesium  125 MG CAPS One at bedtime   mirtazapine  (REMERON ) 30 MG tablet Take 0.5 tablets (15 mg total) by mouth at bedtime.   Nutritional Supplements (ENSURE HIGH PROTEIN)  LIQD Take 237 mLs by mouth in the morning and at bedtime.   ondansetron  (ZOFRAN ) 8 MG tablet Take 1 tablet (8 mg total) by mouth every 8 (eight) hours as needed for nausea or vomiting. Start on the third day after cisplatin .   Potassium 99 MG TABS Take 1 tablet by mouth daily.   predniSONE  (DELTASONE ) 20 MG tablet Take 20 mg by mouth daily with breakfast.   prochlorperazine  (COMPAZINE ) 10 MG tablet Take 1 tablet (10 mg total) by mouth every 6 (six) hours as needed (Nausea or vomiting).   Vedolizumab  (ENTYVIO  IV) Inject into the vein every 6 (six) weeks.   venlafaxine  XR (EFFEXOR -XR) 150 MG 24 hr capsule Take 1 capsule (150 mg total) by mouth daily with breakfast.   venlafaxine  XR (EFFEXOR -XR) 75 MG 24 hr capsule Take 75 mg by mouth daily.   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 06/15/2018   Cancer of skin of neck    Cataract    Chronic mesenteric ischemia ([name])    Chronic pain syndrome 07/09/2016   Crohn's disease of small and large intestines ([name])    followed by dr lupita Analuisa Tudor   Dairy product intolerance    Diarrhea, functional    Family history of adverse reaction to anesthesia    Grover's disease    transient acantholytic dermatosis   History of alcohol abuse    History of basal cell carcinoma excision    2013 left leg   History of Clostridium difficile    10/ 2014   History of multiple concussions    x6   last one Jan 2017 per pt--  no residual   History of substance abuse ([name])    quit 1997 per pt   History of suicide attempt    05-18-2012  overdose /  failure to thrive   Iron  deficiency anemia due to chronic blood loss    Major depression, recurrent, chronic ([name])    Osteopenia    Personal history of adenomatous colonic polyps 12/2010,  03/2012   12/2010 - 8 mm serrated adenoma of rectum   Portal vein thrombosis 03/21/2015   right   Primary sclerosing cholangitis    ? 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   Substance abuse ([name]) 1997   Alcohol   Vitamin A  deficiency 05/23/2018   Vitamin B6 deficiency 07/27/2018   Vitamin C deficiency 05/23/2018   Past Surgical History:  Procedure Laterality Date   ABDOMINAL AORTAGRAM N/A 03/29/2012   Procedure: ABDOMINAL EZELLA;  Surgeon: Gaile LELON New, MD;  Location: [facility] CATH LAB;  Service: Cardiovascular;  Laterality: N/A;   ADENOIDECTOMY  age 11   BIOPSY  05/31/2018   Procedure: BIOPSY;  Surgeon: Teressa Toribio SQUIBB, MD;  Location: [facility];  Service: Endoscopy;;   CATARACT EXTRACTION W/ INTRAOCULAR LENS  IMPLANT, BILATERAL  2009   COLONOSCOPY  2001, 05/02/2003, 01/28/11   2012: Right colon Crohn's, rectal polyp   COLONOSCOPY  03/31/2012   Procedure: COLONOSCOPY;  Surgeon: Gordy CHRISTELLA Starch, MD;  Location: [facility] ENDOSCOPY;  Service: Gastroenterology;  Laterality: N/A;   COLONOSCOPY N/A 08/18/2023   Procedure: COLONOSCOPY;  Surgeon: Avram Lupita BRAVO, MD;  Location: [facility] ENDOSCOPY;  Service: Gastroenterology;  Laterality: N/A;   COLONOSCOPY N/A 08/22/2023   Procedure: COLONOSCOPY;  Surgeon: Federico Rosario BROCKS, MD;  Location: [facility];  Service: Gastroenterology;  Laterality: N/A;   COLONOSCOPY WITH PROPOFOL  N/A 05/31/2018   Procedure: COLONOSCOPY WITH PROPOFOL ;  Surgeon: Teressa Toribio SQUIBB, MD;  Location: [facility];  Service: Endoscopy;  Laterality: N/A;   ESOPHAGOGASTRODUODENOSCOPY  01/28/2011   Normal   ESOPHAGOGASTRODUODENOSCOPY N/A 08/18/2023   Procedure: EGD (ESOPHAGOGASTRODUODENOSCOPY);  Surgeon: Avram Lupita BRAVO, MD;  Location: [facility];  Service: Gastroenterology;  Laterality: N/A;   FOOT SURGERY Right age 36   IR IMAGING GUIDED PORT INSERTION  09/14/2023   MOHS SURGERY Left 11/2013   left ankle parakerotosis     PERCUTANEOUS LIVER BIOPSY  2007 and 2008   PILONIDAL CYST EXCISION  age 11   PLACEMENT OF SETON N/A 11/06/2015   Procedure: PLACEMENT OF SETON;  Surgeon: Bernarda Ned, MD;  Location: [facility];  Service: General;  Laterality: N/A;   PLACEMENT OF SETON  07/2018   at [facility]   RECTAL EXAM UNDER ANESTHESIA N/A 01/18/2019   Procedure: ANAL EXAM UNDER ANESTHESIA,  INCISION AND DRAINAGE, SETON PLACEMENT;  Surgeon: Ned Bernarda, MD;  Location: [facility];  Service: General;  Laterality: N/A;   UPPER GASTROINTESTINAL ENDOSCOPY     Social History   Social History Narrative   Single, history of substance abuse in recovery   Previously occupied on medical disability   1 child  Daughter Wellsite geologist, lives and works in [place]   Elderly parents involved and he helps them   1 brother   family history includes Allergies in his father; Breast cancer in his mother; Clotting disorder in his father; Heart disease in his father; Stomach cancer in his mother; Thyroid  cancer in his father.   Review of Systems   Objective:   Physical Exam BP 110/64   Pulse 88   Ht 6' 1.5 (1.867 m)   Wt 165 lb (74.8 kg)   SpO2 97%   BMI 21.47 kg/m  NAD Anicteric Lungs cta Cor NL S1S2 no rmg Abd soft NT no HSM/mass

## 2023-10-19 NOTE — Progress Notes (Signed)
 Patient seen by Dr. Chinita Pasam today  Vitals are within treatment parameters:No (Please specify and give further instructions.)  Labs are within treatment parameters: Yes   Dr. Autumn aware that LFT's are not within normal limits but is okay to proceed.  Treatment plan has been signed: Yes   Per physician team, Patient is ready for treatment and there are NO modifications to the treatment plan.   Dr. Autumn aware that LFT's are not within normal limits but is okay to proceed.

## 2023-10-19 NOTE — Patient Instructions (Signed)
 As discussed, over the next couple of weeks, try taking prednisone  15 mg daily alternating with 20 mg.    Please call in October to schedule a follow up appointment in December.  Thank you for entrusting me with your care and for choosing [facility], Dr. Lupita Commander  _______________________________________________________  If your blood pressure at your visit was 140/90 or greater, please contact your primary care physician to follow up on this.  _______________________________________________________  If you are age 67 or older, your body mass index should be between 23-30. Your Body mass index is 21.47 kg/m. If this is out of the aforementioned range listed, please consider follow up with your Primary Care Provider.  If you are age 75 or younger, your body mass index should be between 19-25. Your Body mass index is 21.47 kg/m. If this is out of the aformentioned range listed, please consider follow up with your Primary Care Provider.   ________________________________________________________  The [facility] providers would like to encourage you to use MYCHART to communicate with providers for non-urgent requests or questions.  Due to long hold times on the telephone, sending your provider a message by [facility] may be a faster and more efficient way to get a response.  Please allow 48 business hours for a response.  Please remember that this is for non-urgent requests.  _______________________________________________________  [facility] is using a team-based approach to care.  Your team is made up of your doctor and two to three APPS. Our APPS (Nurse Practitioners and Physician Assistants) work with your physician to ensure care continuity for you. They are fully qualified to address your health concerns and develop a treatment plan. They communicate directly with your gastroenterologist to care for you. Seeing the Advanced Practice Practitioners on your physician's  team can help you by facilitating care more promptly, often allowing for earlier appointments, access to diagnostic testing, procedures, and other specialty referrals.

## 2023-10-19 NOTE — Assessment & Plan Note (Signed)
 Current leukocytosis seems to be from G-CSF support that he received on 10/08/2023.  We will consider skipping G-CSF support after cycle 2.

## 2023-10-19 NOTE — Assessment & Plan Note (Addendum)
 Intrahepatic cholangiocarcinoma, diagnosed following MRI and liver biopsy in May 2025.  He underwent ultrasound-guided biopsy of liver lesion on 07/29/2023.  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 CDX-2 positive, CK20 positive, CK7 positive, synaptophysin negative, chromogranin negative.  IHC profile suggested a gastrointestinal or pancreaticobiliary primary.  To look for a primary, patient underwent upper endoscopy and colonoscopy on 08/18/2023, by Dr. Avram.  EGD showed evidence of gastritis, single gastric polyp.  Otherwise unremarkable.  Colonoscopy showed several polyps in the transverse colon, sigmoid colon, descending colon.  Evidence of diverticulosis in the sigmoid colon.  Multiple biopsies were obtained.  Biopsies from the stomach, colon, cecum all showed benign findings.  No evidence of malignancy noted.  Clinical picture is consistent with intrahepatic cholangiocarcinoma, primarily located in the right lobe of the liver with disease limited to the liver area. A new small lesion on the dome of the left side of the liver noted on PET scan and also recent CT scan.  Too small to be biopsied.  Will have to monitor this on future scans.   His case was previously discussed in GI multidisciplinary tumor conference.  Dr. Dasie felt that he can be a surgical candidate following neoadjuvant chemotherapy.  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  Given history of autoimmune hepatitis, primary sclerosing cholangitis, he would not be a candidate for immunotherapy.  Hence plan would be to proceed with chemotherapy using cisplatin  and gemcitabine .  Request previously submitted for NGS testing on the specimen and liquid biopsy.  Following drainage of pelvic/anal abscess, once we have obtained clearance from the surgery  department, and once his C. difficile was cleared, he was started on systemic chemotherapy with cisplatin  and gemcitabine  from 09/28/2023.  Plan is to continue once weekly for two weeks on, one week off, depending on tolerance.   Tolerating chemotherapy reasonably well so far and he has noticed subjective improvement.  Labs today showed white count of 27,000 with ANC of 21,800, leukocytosis presumed to be from G-CSF support that he received on 10/08/2023.  No overt signs of infection.  Hemoglobin and platelet count are within normal limits.  LFTs remain chronically elevated but improved compared to prior.  Overall no dose-limiting toxicities.  He is scheduled for cycle 2-day 1 of chemotherapy tomorrow.  Will proceed with chemotherapy as planned.  He will be referred to Dr. Leonor Dasie for surgical evaluation following 3-4 cycles of chemotherapy.  - Monitor kidney function and ensure adequate hydration to prevent kidney dysfunction.  - Monitor blood counts, liver function, and for side effects such as nausea, fatigue, hearing loss, and neuropathy.  - Refer to Dr.Shelby Dasie for surgical evaluation post-chemotherapy.  - Monitor CA 19-9 and CEA levels monthly.

## 2023-10-19 NOTE — Assessment & Plan Note (Signed)
 Autoimmune hepatitis diagnosed in 2012, managed with prednisone . Immunotherapy/checkpoint inhibitor therapy is contraindicated due to the risk of exacerbating liver issues.

## 2023-10-19 NOTE — Progress Notes (Signed)
 [facility]  ONCOLOGY CLINIC PROGRESS NOTE   Patient Care Team: [name], MD as PCP - General (Family Medicine) Avram Lupita BRAVO, MD as Attending Physician (Gastroenterology) Harvey Carlin BRAVO, MD (Inactive) as Attending Physician (Vascular Surgery) Ardis Evalene CROME, RN as Oncology Nurse Navigator Muaz Shorey, Chinita, MD as Consulting Physician (Hematology and Oncology)  PATIENT NAME: [name]   MR#: [id number] DOB: 09/07/1965  Date of visit: 10/19/2023   ASSESSMENT & PLAN:   [name] is a 58 y.o.  gentleman with a past medical history of primary sclerosing cholangitis, autoimmune hepatitis, Crohn's disease, anxiety/depression, PTSD, past C. difficile colitis, chronic pain syndrome, chronic mesenteric ischemia, basal cell carcinoma of left lower extremity, history of suicide attempt, past portal vein thrombosis, was referred to our clinic in June 2025 for newly diagnosed intrahepatic cholangiocarcinoma  Intrahepatic cholangiocarcinoma ([name]) Intrahepatic cholangiocarcinoma, diagnosed following MRI and liver biopsy in May 2025.  He underwent ultrasound-guided biopsy of liver lesion on 07/29/2023.  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 CDX-2 positive, CK20 positive, CK7 positive, synaptophysin negative, chromogranin negative.  IHC profile suggested a gastrointestinal or pancreaticobiliary primary.  To look for a primary, patient underwent upper endoscopy and colonoscopy on 08/18/2023, by Dr. Avram.  EGD showed evidence of gastritis, single gastric polyp.  Otherwise unremarkable.  Colonoscopy showed several polyps in the transverse colon, sigmoid colon, descending colon.  Evidence of diverticulosis in the sigmoid colon.  Multiple biopsies were obtained.  Biopsies from the stomach, colon, cecum all showed benign findings.  No evidence of malignancy noted.  Clinical picture  is consistent with intrahepatic cholangiocarcinoma, primarily located in the right lobe of the liver with disease limited to the liver area. A new small lesion on the dome of the left side of the liver noted on PET scan and also recent CT scan.  Too small to be biopsied.  Will have to monitor this on future scans.   His case was previously discussed in GI multidisciplinary tumor conference.  Dr. Dasie felt that he can be a surgical candidate following neoadjuvant chemotherapy.  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  Given history of autoimmune hepatitis, primary sclerosing cholangitis, he would not be a candidate for immunotherapy.  Hence plan would be to proceed with chemotherapy using cisplatin  and gemcitabine .  Request previously submitted for NGS testing on the specimen and liquid biopsy.  Following drainage of pelvic/anal abscess, once we have obtained clearance from the surgery department, and once his C. difficile was cleared, he was started on systemic chemotherapy with cisplatin  and gemcitabine  from 09/28/2023.  Plan is to continue once weekly for two weeks on, one week off, depending on tolerance.   Tolerating chemotherapy reasonably well so far and he has noticed subjective improvement.  Labs today showed white count of 27,000 with ANC of 21,800, leukocytosis presumed to be from G-CSF support that he received on 10/08/2023.  No overt signs of infection.  Hemoglobin and platelet count are within normal limits.  LFTs remain chronically elevated but improved compared to prior.  Overall no dose-limiting toxicities.  He is scheduled for cycle 2-day 1 of chemotherapy tomorrow.  Will proceed with chemotherapy as planned.  He will be referred to Dr. Leonor Dasie for surgical evaluation following 3-4 cycles of chemotherapy.  - Monitor kidney function and ensure adequate hydration to prevent  kidney dysfunction.  - Monitor blood  counts, liver function, and for side effects such as nausea, fatigue, hearing loss, and neuropathy.  - Refer to Dr.Shelby Dasie for surgical evaluation post-chemotherapy.  - Monitor CA 19-9 and CEA levels monthly.   Autoimmune hepatitis-PSC overlap Autoimmune hepatitis diagnosed in 2012, managed with prednisone . Immunotherapy/checkpoint inhibitor therapy is contraindicated due to the risk of exacerbating liver issues.  Anal fistula Colorectal fistula is healing well with minimal, manageable drainage.  Leukocytosis Current leukocytosis seems to be from G-CSF support that he received on 10/08/2023.  We will consider skipping G-CSF support after cycle 2.  History of Clostridioides difficile infection Clostridioides difficile infection has resolved. He reports no diarrhea for the past month and has cleared the 30-day period without a positive test for C. diff.  I reviewed lab results and outside records for this visit and discussed relevant results with the patient. Diagnosis, plan of care and treatment options were also discussed in detail with the patient. Opportunity provided to ask questions and answers provided to his apparent satisfaction. Provided instructions to call our clinic with any problems, questions or concerns prior to return visit. I recommended to continue follow-up with PCP and sub-specialists. He verbalized understanding and agreed with the plan.   NCCN guidelines have been consulted in the planning of this patient's care.  I spent a total of 40 minutes during this encounter with the patient including review of chart and various tests results, discussions about plan of care and coordination of care plan.   Chinita Patten, MD  10/19/2023 5:21 PM  [facility] - A DEPT OF [facility] [address] [place] [address] Dept: [phone] Dept Fax: [phone]    CHIEF  COMPLAINT/ REASON FOR VISIT:   Intrahepatic cholangiocarcinoma, diagnosed in June 2025.  Current Treatment: Plan for systemic treatment with cisplatin  and gemcitabine , followed by surgical evaluation.  INTERVAL HISTORY:    Discussed the use of AI scribe software for clinical note transcription with the patient, who gave verbal consent to proceed.  History of Present Illness  [name] is a 58 year old male undergoing chemotherapy who presents for treatment follow-up.  He is currently undergoing chemotherapy and has completed one cycle. He tolerates the treatment well without the need for anti-nausea medication. No nausea, vomiting, or diarrhea during the treatment cycle. The Clostridioides difficile infection has resolved, with no diarrhea for the past month.  He has a history of a fistula and abscess, which have been addressed and are almost healed, with only minor drainage remaining.  No fevers, chills, night sweats, tingling, or numbness in his hands or feet. He has been drinking plenty of water and has stopped taking potassium supplements as his levels have normalized.     I have reviewed the past medical history, past surgical history, social history and family history with the patient and they are unchanged from previous note.  HISTORY OF PRESENT ILLNESS:   ONCOLOGY HISTORY:   He has a history of autoimmune hepatitis, primary sclerosing cholangitis diagnosed in 2012, and Crohn's disease for which he is currently taking prednisone  and Entyvio .   He was admitted to the hospital on 07/02/2023 after he presented with complaints of multiple episodes of diarrhea, dizziness, near syncope.  CT scan of the abdomen and pelvis on 07/02/2023 showed new 3.7 x 2.8 cm noncystic lesion in the dome of the liver, which was not present on prior studies.  Given history of primary sclerosing cholangitis,  there was concern for cholangiocarcinoma and MRI was recommended.   On  07/03/2023, MRI of the abdomen showed 4.3 x 2.8 x 3.7 cm lesion in the lateral aspect of the hepatic dome which showed heterogeneous enhancement after IV contrast administration, suspicious for neoplasm.  Cholangiocarcinoma was considered because of his history of primary sclerosing cholangitis.  Inferior and posterior to this lesion was a cyst measuring 5 x 4.5 cm, slightly increased in size compared to previous MRI.  Bile duct showed variable dilatation and stenosis.   Patient did not want to stay in the hospital for biopsy at that time.   He underwent ultrasound-guided biopsy of liver lesion on 07/29/2023.  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 CDX-2 positive, CK20 positive, CK7 positive, synaptophysin negative, chromogranin negative.  IHC profile suggested a gastrointestinal or pancreaticobiliary primary.   To look for a primary, patient underwent upper endoscopy and colonoscopy on 08/18/2023, by Dr. Avram.  EGD showed evidence of gastritis, single gastric polyp.  Otherwise unremarkable.  Colonoscopy showed several polyps in the transverse colon, sigmoid colon, descending colon.  Evidence of diverticulosis in the sigmoid colon.  Multiple biopsies were obtained.  Biopsies from the stomach, colon, cecum all showed benign findings.  No evidence of malignancy noted.   Clinical picture is consistent with intrahepatic cholangiocarcinoma.   Given history of autoimmune hepatitis, primary sclerosing cholangitis, he would not be a candidate for immunotherapy.  Hence plan would be to proceed with chemotherapy using cisplatin  and gemcitabine .   Request submitted for NGS testing on the specimen and liquid biopsy.  Liquid biopsy showed no actionable mutations.   On 09/09/2023, staging PET scan showed marginal areas of uptake along the mass lesion in the posterior superior right hepatic lobe, corresponding to the area of known neoplasm.   There were small cystic areas elsewhere in the liver, new small low-density focus in the dome of left hepatic lobe.  No appreciable abnormal uptake.  No areas of abnormal uptake seen above the diaphragm.  Findings consistent with anal fistula noted.  His case was previously discussed in GI multidisciplinary tumor conference.  Dr. Dasie felt that he can be a surgical candidate following neoadjuvant chemotherapy.  Following drainage of pelvic/anal abscess, once we have obtained clearance from the surgery department, and once his C. difficile was cleared, he was started on systemic chemotherapy with cisplatin  and gemcitabine  from 09/28/2023.  Plan is to continue once weekly for two weeks on, one week off, depending on tolerance.   He will be referred to Dr. Leonor Dasie for surgical evaluation following 3-4 cycles of chemotherapy.  Oncology History  Intrahepatic cholangiocarcinoma ([name])  08/18/2023 Initial Diagnosis   Cholangiocarcinoma metastatic to liver ([name])   08/26/2023 Cancer Staging   Staging form: Intrahepatic Bile Duct, AJCC 8th Edition - Clinical: Stage II (cT2, cN0, cM0) - Signed by Autumn Millman, MD on 09/15/2023   09/28/2023 -  Chemotherapy   Patient is on Treatment Plan : BILIARY TRACT Cisplatin  + Gemcitabine  D1,8 q21d         REVIEW OF SYSTEMS:   Review of Systems - Oncology  All other pertinent systems were reviewed with the patient and are negative.  ALLERGIES: He is allergic to tape, asacol  [mesalamine ], azathioprine, gluten meal, metoprolol  tartrate, mycophenolate mofetil, other, wheat, abilify  [aripiprazole ], and amoxicillin .  MEDICATIONS:  Current Outpatient Medications  Medication Sig Dispense Refill   Cholecalciferol 50 MCG (2000 UT) CAPS Take 1 capsule by mouth daily.  dexamethasone  (DECADRON ) 4 MG tablet Take 2 tablets (8 mg) by mouth daily x 3 days starting the day after cisplatin  chemotherapy. Take with food. 30 tablet 1   ferrous sulfate  325 (65 FE) MG  tablet Take 325 mg by mouth daily with breakfast. Continues to take per low HGB     hydrOXYzine  (ATARAX ) 25 MG tablet Take 1 tablet (25 mg total) by mouth 3 (three) times daily as needed for anxiety. 30 tablet 0   lidocaine -prilocaine  (EMLA ) cream Apply to affected area once 30 g 3   mirtazapine  (REMERON ) 30 MG tablet Take 0.5 tablets (15 mg total) by mouth at bedtime.     Nutritional Supplements (ENSURE HIGH PROTEIN) LIQD Take 237 mLs by mouth in the morning and at bedtime.     Potassium 99 MG TABS Take 1 tablet by mouth daily.     predniSONE  (DELTASONE ) 20 MG tablet Take 20 mg by mouth daily with breakfast.     Vedolizumab  (ENTYVIO  IV) Inject into the vein every 6 (six) weeks.     venlafaxine  XR (EFFEXOR -XR) 150 MG 24 hr capsule Take 1 capsule (150 mg total) by mouth daily with breakfast. 30 capsule 0   venlafaxine  XR (EFFEXOR -XR) 75 MG 24 hr capsule Take 75 mg by mouth daily.     Magnesium  125 MG CAPS One at bedtime     ondansetron  (ZOFRAN ) 8 MG tablet Take 1 tablet (8 mg total) by mouth every 8 (eight) hours as needed for nausea or vomiting. Start on the third day after cisplatin . 30 tablet 1   prochlorperazine  (COMPAZINE ) 10 MG tablet Take 1 tablet (10 mg total) by mouth every 6 (six) hours as needed (Nausea or vomiting). 30 tablet 1   No current facility-administered medications for this visit.     VITALS:   Blood pressure 126/70, pulse 85, temperature 98.5 F (36.9 C), resp. rate 17, weight 166 lb 9.6 oz (75.6 kg), SpO2 99%.  Wt Readings from Last 3 Encounters:  10/19/23 165 lb (74.8 kg)  10/19/23 166 lb 9.6 oz (75.6 kg)  10/03/23 166 lb 8 oz (75.5 kg)    Body mass index is 21.68 kg/m.    Onc Performance Status - 10/19/23 0936       ECOG Perf Status   ECOG Perf Status Fully active, able to carry on all pre-disease performance without restriction      KPS SCALE   KPS % SCORE Normal, no compliants, no evidence of disease           PHYSICAL EXAM:   Physical  Exam Constitutional:      General: He is not in acute distress.    Appearance: Normal appearance.  HENT:     Head: Normocephalic and atraumatic.  Eyes:     Conjunctiva/sclera: Conjunctivae normal.  Cardiovascular:     Rate and Rhythm: Normal rate and regular rhythm.  Pulmonary:     Effort: Pulmonary effort is normal. No respiratory distress.  Chest:     Comments: Port-A-Cath in place without any signs of infection Abdominal:     General: There is no distension.  Neurological:     General: No focal deficit present.     Mental Status: He is alert and oriented to person, place, and time.  Psychiatric:        Mood and Affect: Mood normal.        Behavior: Behavior normal.       LABORATORY DATA:   I have reviewed the data as listed.  Results for orders placed or performed in visit on 10/19/23  Magnesium   Result Value Ref Range   Magnesium  1.9 1.7 - 2.4 mg/dL  CMP (Cancer Center only)  Result Value Ref Range   Sodium 137 135 - 145 mmol/L   Potassium 3.8 3.5 - 5.1 mmol/L   Chloride 103 98 - 111 mmol/L   CO2 30 22 - 32 mmol/L   Glucose, Bld 101 (H) 70 - 99 mg/dL   BUN 24 (H) 6 - 20 mg/dL   Creatinine 9.11 9.38 - 1.24 mg/dL   Calcium 8.7 (L) 8.9 - 10.3 mg/dL   Total Protein 5.7 (L) 6.5 - 8.1 g/dL   Albumin 3.4 (L) 3.5 - 5.0 g/dL   AST 56 (H) 15 - 41 U/L   ALT 118 (H) 0 - 44 U/L   Alkaline Phosphatase 273 (H) 38 - 126 U/L   Total Bilirubin 0.3 0.0 - 1.2 mg/dL   GFR, Estimated >39 >39 mL/min   Anion gap 4 (L) 5 - 15  CBC with Differential (Cancer Center Only)  Result Value Ref Range   WBC Count 27.0 (H) 4.0 - 10.5 K/uL   RBC 4.29 4.22 - 5.81 MIL/uL   Hemoglobin 13.2 13.0 - 17.0 g/dL   HCT 59.7 60.9 - 47.9 %   MCV 93.7 80.0 - 100.0 fL   MCH 30.8 26.0 - 34.0 pg   MCHC 32.8 30.0 - 36.0 g/dL   RDW 83.4 (H) 88.4 - 84.4 %   Platelet Count 167 150 - 400 K/uL   nRBC 0.1 0.0 - 0.2 %   Neutrophils Relative % 80 %   Neutro Abs 21.8 (H) 1.7 - 7.7 K/uL   Lymphocytes Relative  4 %   Lymphs Abs 1.0 0.7 - 4.0 K/uL   Monocytes Relative 6 %   Monocytes Absolute 1.6 (H) 0.1 - 1.0 K/uL   Eosinophils Relative 0 %   Eosinophils Absolute 0.0 0.0 - 0.5 K/uL   Basophils Relative 0 %   Basophils Absolute 0.1 0.0 - 0.1 K/uL   WBC Morphology See Note    RBC Morphology MORPHOLOGY UNREMARKABLE    Smear Review Normal platelet morphology    Immature Granulocytes 10 %   Abs Immature Granulocytes 2.56 (H) 0.00 - 0.07 K/uL       RADIOGRAPHIC STUDIES:  No recent pertinent imaging available to review.  CODE STATUS:  Code Status History     Date Active Date Inactive Code Status Order ID Comments User Context   09/14/2023 1143 09/15/2023 0511 Full Code [id number]  Johann Sieving, MD HOV   08/20/2023 1401 08/23/2023 1406 Full Code [id number]  Lou Claretta HERO, MD ED   07/29/2023 1419 07/30/2023 0518 Full Code [id number]  Johann Sieving, MD HOV   07/02/2023 1533 07/05/2023 2312 Full Code [id number]  Celinda Alm Lot, MD ED   04/13/2023 1949 05/20/2023 1942 Full Code [id number]  Tony Cathaleen LABOR, PMHNP Inpatient   03/28/2023 2234 04/04/2023 1348 Full Code [id number]  Alan Thurman GAILS, NP Inpatient   03/28/2023 1136 03/28/2023 2111 Full Code [id number]  Minnie Tinnie BRAVO, PA ED   08/26/2018 1234 09/01/2018 2105 Full Code [id number]  Arloa Chroman, PA-C ED   08/25/2018 1240 08/25/2018 2000 Full Code [id number]  Little, Vernell Search, MD ED   07/31/2018 1406 08/01/2018 1446 Full Code [id number]  Zada Elouise BROCKS, PA-C ED   07/19/2018 2045 07/21/2018 1739 DNR [id number]  [place], Eric J, DO Inpatient   05/28/2018 1506 06/01/2018 1222 DNR [id number]  Barbarann Nest, MD  Inpatient   05/20/2012 1059 05/25/2012 1606 DNR [id number]  Cherisse Ronal ORN, NP Inpatient   05/18/2012 1723 05/20/2012 1059 Full Code [id number]  Juvenal Harlene PENNER, DO Inpatient    Questions for Most Recent Historical Code Status (Order [id number])     Question Answer   By: Other            Orders Placed This Encounter  Procedures   CBC with  Differential (Cancer Center Only)    Standing Status:   Future    Expected Date:   11/10/2023    Expiration Date:   11/09/2024   CMP (Cancer Center only)    Standing Status:   Future    Expected Date:   11/10/2023    Expiration Date:   11/09/2024   Magnesium     Standing Status:   Future    Expected Date:   11/10/2023    Expiration Date:   11/09/2024   CBC with Differential (Cancer Center Only)    Standing Status:   Future    Expected Date:   11/17/2023    Expiration Date:   11/16/2024   CMP (Cancer Center only)    Standing Status:   Future    Expected Date:   11/17/2023    Expiration Date:   11/16/2024   Magnesium     Standing Status:   Future    Expected Date:   11/17/2023    Expiration Date:   11/16/2024     Future Appointments  Date Time Provider Department Center  10/20/2023  7:30 AM [facility] INFUSION [facility] None  10/21/2023 10:00 AM Helane Rosaline BRAVO, LCSW [facility] None  10/28/2023  8:45 AM [facility] FLUSH [facility] None  10/28/2023  9:15 AM Daiel Strohecker, MD [facility] None  10/28/2023 10:00 AM [facility] INFUSION [facility] None  11/01/2023 11:45 AM [facility] FLUSH [facility] None      This document was completed utilizing speech recognition software. Grammatical errors, random word insertions, pronoun errors, and incomplete sentences are an occasional consequence of this system due to software limitations, ambient noise, and hardware issues. Any formal questions or concerns about the content, text or information contained within the body of this dictation should be directly addressed to the provider for clarification.

## 2023-10-20 ENCOUNTER — Other Ambulatory Visit: Payer: Self-pay | Admitting: Internal Medicine

## 2023-10-20 ENCOUNTER — Inpatient Hospital Stay

## 2023-10-20 VITALS — BP 136/85 | HR 82 | Temp 98.1°F | Resp 18

## 2023-10-20 DIAGNOSIS — C221 Intrahepatic bile duct carcinoma: Secondary | ICD-10-CM | POA: Diagnosis not present

## 2023-10-20 DIAGNOSIS — Z5189 Encounter for other specified aftercare: Secondary | ICD-10-CM | POA: Diagnosis not present

## 2023-10-20 DIAGNOSIS — Z79899 Other long term (current) drug therapy: Secondary | ICD-10-CM | POA: Diagnosis not present

## 2023-10-20 DIAGNOSIS — Z5111 Encounter for antineoplastic chemotherapy: Secondary | ICD-10-CM | POA: Diagnosis not present

## 2023-10-20 MED ORDER — POTASSIUM CHLORIDE IN NACL 20-0.9 MEQ/L-% IV SOLN
Freq: Once | INTRAVENOUS | Status: AC
  Filled 2023-10-20: qty 1000

## 2023-10-20 MED ORDER — SODIUM CHLORIDE 0.9 % IV SOLN
25.0000 mg/m2 | Freq: Once | INTRAVENOUS | Status: AC
  Administered 2023-10-20: 50 mg via INTRAVENOUS
  Filled 2023-10-20: qty 50

## 2023-10-20 MED ORDER — SODIUM CHLORIDE 0.9% FLUSH: 10.0000 mL | INTRAVENOUS | Status: DC | PRN

## 2023-10-20 MED ORDER — MAGNESIUM SULFATE 2 GM/50ML IV SOLN
2.0000 g | Freq: Once | INTRAVENOUS | Status: AC
  Administered 2023-10-20: 2 g via INTRAVENOUS
  Filled 2023-10-20: qty 50

## 2023-10-20 MED ORDER — SODIUM CHLORIDE 0.9 % IV SOLN: INTRAVENOUS | Status: DC

## 2023-10-20 MED ORDER — DEXAMETHASONE SODIUM PHOSPHATE 10 MG/ML IJ SOLN
10.0000 mg | Freq: Once | INTRAMUSCULAR | Status: AC
  Administered 2023-10-20: 10 mg via INTRAVENOUS
  Filled 2023-10-20: qty 1

## 2023-10-20 MED ORDER — SODIUM CHLORIDE 0.9 % IV SOLN
1000.0000 mg/m2 | Freq: Once | INTRAVENOUS | Status: AC
  Administered 2023-10-20: 1938 mg via INTRAVENOUS
  Filled 2023-10-20: qty 50.97

## 2023-10-20 MED ORDER — PALONOSETRON HCL INJECTION 0.25 MG/5ML
0.2500 mg | Freq: Once | INTRAVENOUS | Status: AC
  Administered 2023-10-20: 0.25 mg via INTRAVENOUS
  Filled 2023-10-20: qty 5

## 2023-10-20 MED ORDER — APREPITANT 130 MG/18ML IV EMUL
130.0000 mg | Freq: Once | INTRAVENOUS | Status: AC
  Administered 2023-10-20: 130 mg via INTRAVENOUS
  Filled 2023-10-20: qty 18

## 2023-10-20 NOTE — Telephone Encounter (Signed)
 Please advise Sir, thank you.

## 2023-10-20 NOTE — Patient Instructions (Signed)
 [facility] - A DEPT OF [facility]  Discharge Instructions: Thank you for choosing [facility] to provide your oncology and hematology care.   If you have a lab appointment with the Cancer Center, please go directly to the Cancer Center and check in at the registration area.   Wear comfortable clothing and clothing appropriate for easy access to any Portacath or PICC line.   We strive to give you quality time with your provider. You may need to reschedule your appointment if you arrive late (15 or more minutes).  Arriving late affects you and other patients whose appointments are after yours.  Also, if you miss three or more appointments without notifying the office, you may be dismissed from the clinic at the provider's discretion.      For prescription refill requests, have your pharmacy contact our office and allow 72 hours for refills to be completed.    Today you received the following chemotherapy and/or immunotherapy agents: CISplatin  (PLATINOL ), gemcitabine  (GEMZAR )      To help prevent nausea and vomiting after your treatment, we encourage you to take your nausea medication as directed.  BELOW ARE SYMPTOMS THAT SHOULD BE REPORTED IMMEDIATELY: *FEVER GREATER THAN 100.4 F (38 C) OR HIGHER *CHILLS OR SWEATING *NAUSEA AND VOMITING THAT IS NOT CONTROLLED WITH YOUR NAUSEA MEDICATION *UNUSUAL SHORTNESS OF BREATH *UNUSUAL BRUISING OR BLEEDING *URINARY PROBLEMS (pain or burning when urinating, or frequent urination) *BOWEL PROBLEMS (unusual diarrhea, constipation, pain near the anus) TENDERNESS IN MOUTH AND THROAT WITH OR WITHOUT PRESENCE OF ULCERS (sore throat, sores in mouth, or a toothache) UNUSUAL RASH, SWELLING OR PAIN  UNUSUAL VAGINAL DISCHARGE OR ITCHING   Items with * indicate a potential emergency and should be followed up as soon as possible or go to the Emergency Department if any problems should occur.  Please show the  CHEMOTHERAPY ALERT CARD or IMMUNOTHERAPY ALERT CARD at check-in to the Emergency Department and triage nurse.  Should you have questions after your visit or need to cancel or reschedule your appointment, please contact [facility]  Dept: [phone]  and follow the prompts.  Office hours are 8:00 a.m. to 4:30 p.m. Monday - Friday. Please note that voicemails left after 4:00 p.m. may not be returned until the following business day.  We are closed weekends and major holidays. You have access to a nurse at all times for urgent questions. Please call the main number to the clinic Dept: [phone] and follow the prompts.   For any non-urgent questions, you may also contact your provider using MyChart. We now offer e-Visits for anyone 91 and older to request care online for non-urgent symptoms. For details visit [email or web address].   Also download the MyChart app! Go to the app store, search MyChart, open the app, select [facility], and log in with your MyChart username and password.

## 2023-10-20 NOTE — Progress Notes (Signed)
 Per Dr Autumn, elevated WBC likely from GCSF injection, okay to proceed with treatment.

## 2023-10-21 ENCOUNTER — Other Ambulatory Visit: Payer: Self-pay

## 2023-10-21 ENCOUNTER — Ambulatory Visit

## 2023-10-21 ENCOUNTER — Inpatient Hospital Stay: Admitting: Licensed Clinical Social Worker

## 2023-10-21 NOTE — Progress Notes (Signed)
 [facility] Healthcare Advance Directives Clinical Social Work  Patient presented to Advance Directives Clinic  to review and complete healthcare advance directives.  Clinical Social Worker met with patient. CSW intern, DELENA Daring present during visit.  The patient designated [name] (brother) as their primary healthcare agent, [name] (mother) as their secondary agent, and [name] (daughter) as their tertiary agent.  Patient also completed healthcare living will.    Documents were notarized and copies made for patient/family. Clinical Social Worker will send documents to medical records to be scanned into patient's chart. Clinical Social Worker encouraged patient/family to contact with any additional questions or concerns.   Dylanie Quesenberry E Jancarlo Biermann, LCSW Clinical Social Worker [facility]

## 2023-10-24 DIAGNOSIS — M9903 Segmental and somatic dysfunction of lumbar region: Secondary | ICD-10-CM | POA: Diagnosis not present

## 2023-10-27 ENCOUNTER — Ambulatory Visit

## 2023-10-27 ENCOUNTER — Ambulatory Visit: Admitting: Oncology

## 2023-10-28 ENCOUNTER — Inpatient Hospital Stay

## 2023-10-28 ENCOUNTER — Encounter: Payer: Self-pay | Admitting: Oncology

## 2023-10-28 ENCOUNTER — Other Ambulatory Visit

## 2023-10-28 ENCOUNTER — Inpatient Hospital Stay: Admitting: Oncology

## 2023-10-28 ENCOUNTER — Other Ambulatory Visit: Payer: Self-pay

## 2023-10-28 ENCOUNTER — Ambulatory Visit: Admitting: Oncology

## 2023-10-28 VITALS — BP 117/77 | HR 90 | Temp 97.7°F | Resp 17 | Ht 73.5 in | Wt 165.0 lb

## 2023-10-28 VITALS — BP 148/77 | HR 78 | Resp 20

## 2023-10-28 DIAGNOSIS — K603 Anal fistula, unspecified: Secondary | ICD-10-CM

## 2023-10-28 DIAGNOSIS — C221 Intrahepatic bile duct carcinoma: Secondary | ICD-10-CM | POA: Diagnosis not present

## 2023-10-28 DIAGNOSIS — Z5189 Encounter for other specified aftercare: Secondary | ICD-10-CM | POA: Diagnosis not present

## 2023-10-28 DIAGNOSIS — Z5111 Encounter for antineoplastic chemotherapy: Secondary | ICD-10-CM | POA: Diagnosis not present

## 2023-10-28 DIAGNOSIS — Z95828 Presence of other vascular implants and grafts: Secondary | ICD-10-CM

## 2023-10-28 DIAGNOSIS — Z79899 Other long term (current) drug therapy: Secondary | ICD-10-CM | POA: Diagnosis not present

## 2023-10-28 LAB — CBC WITH DIFFERENTIAL (CANCER CENTER ONLY)
Abs Immature Granulocytes: 0.37 K/uL — ABNORMAL HIGH (ref 0.00–0.07)
Basophils Absolute: 0.1 K/uL (ref 0.0–0.1)
Basophils Relative: 1 %
Eosinophils Absolute: 0 K/uL (ref 0.0–0.5)
Eosinophils Relative: 0 %
HCT: 38 % — ABNORMAL LOW (ref 39.0–52.0)
Hemoglobin: 12.6 g/dL — ABNORMAL LOW (ref 13.0–17.0)
Immature Granulocytes: 3 %
Lymphocytes Relative: 12 %
Lymphs Abs: 1.6 K/uL (ref 0.7–4.0)
MCH: 30.5 pg (ref 26.0–34.0)
MCHC: 33.2 g/dL (ref 30.0–36.0)
MCV: 92 fL (ref 80.0–100.0)
Monocytes Absolute: 1.6 K/uL — ABNORMAL HIGH (ref 0.1–1.0)
Monocytes Relative: 12 %
Neutro Abs: 10 K/uL — ABNORMAL HIGH (ref 1.7–7.7)
Neutrophils Relative %: 72 %
Platelet Count: 155 K/uL (ref 150–400)
RBC: 4.13 MIL/uL — ABNORMAL LOW (ref 4.22–5.81)
RDW: 15.6 % — ABNORMAL HIGH (ref 11.5–15.5)
WBC Count: 13.7 K/uL — ABNORMAL HIGH (ref 4.0–10.5)
nRBC: 0 % (ref 0.0–0.2)

## 2023-10-28 LAB — CMP (CANCER CENTER ONLY)
ALT: 159 U/L — ABNORMAL HIGH (ref 0–44)
AST: 75 U/L — ABNORMAL HIGH (ref 15–41)
Albumin: 3.3 g/dL — ABNORMAL LOW (ref 3.5–5.0)
Alkaline Phosphatase: 238 U/L — ABNORMAL HIGH (ref 38–126)
Anion gap: 6 (ref 5–15)
BUN: 25 mg/dL — ABNORMAL HIGH (ref 6–20)
CO2: 28 mmol/L (ref 22–32)
Calcium: 8.6 mg/dL — ABNORMAL LOW (ref 8.9–10.3)
Chloride: 103 mmol/L (ref 98–111)
Creatinine: 0.94 mg/dL (ref 0.61–1.24)
GFR, Estimated: >60 mL/min (ref >60)
Glucose, Bld: 96 mg/dL (ref 70–99)
Potassium: 3.7 mmol/L (ref 3.5–5.1)
Sodium: 137 mmol/L (ref 135–145)
Total Bilirubin: 0.3 mg/dL (ref 0.0–1.2)
Total Protein: 5.8 g/dL — ABNORMAL LOW (ref 6.5–8.1)

## 2023-10-28 LAB — MAGNESIUM: Magnesium: 1.9 mg/dL (ref 1.7–2.4)

## 2023-10-28 MED ORDER — POTASSIUM CHLORIDE IN NACL 20-0.9 MEQ/L-% IV SOLN
Freq: Once | INTRAVENOUS | Status: AC
  Filled 2023-10-28: qty 1000

## 2023-10-28 MED ORDER — SODIUM CHLORIDE 0.9% FLUSH: 10.0000 mL | INTRAVENOUS | Status: DC | PRN

## 2023-10-28 MED ORDER — SODIUM CHLORIDE 0.9 % IV SOLN: INTRAVENOUS | Status: DC

## 2023-10-28 MED ORDER — SODIUM CHLORIDE 0.9 % IV SOLN
1000.0000 mg/m2 | Freq: Once | INTRAVENOUS | Status: AC
  Administered 2023-10-28: 1938 mg via INTRAVENOUS
  Filled 2023-10-28: qty 50.97

## 2023-10-28 MED ORDER — SODIUM CHLORIDE 0.9% FLUSH
10.0000 mL | Freq: Once | INTRAVENOUS | Status: AC
  Administered 2023-10-28: 10 mL

## 2023-10-28 MED ORDER — APREPITANT 130 MG/18ML IV EMUL
130.0000 mg | Freq: Once | INTRAVENOUS | Status: AC
  Administered 2023-10-28: 130 mg via INTRAVENOUS
  Filled 2023-10-28: qty 18

## 2023-10-28 MED ORDER — DEXAMETHASONE SODIUM PHOSPHATE 10 MG/ML IJ SOLN
10.0000 mg | Freq: Once | INTRAMUSCULAR | Status: AC
  Administered 2023-10-28: 10 mg via INTRAVENOUS
  Filled 2023-10-28: qty 1

## 2023-10-28 MED ORDER — PALONOSETRON HCL INJECTION 0.25 MG/5ML
0.2500 mg | Freq: Once | INTRAVENOUS | Status: AC
  Administered 2023-10-28: 0.25 mg via INTRAVENOUS
  Filled 2023-10-28: qty 5

## 2023-10-28 MED ORDER — MAGNESIUM SULFATE 2 GM/50ML IV SOLN
2.0000 g | Freq: Once | INTRAVENOUS | Status: AC
  Administered 2023-10-28: 2 g via INTRAVENOUS
  Filled 2023-10-28: qty 50

## 2023-10-28 MED ORDER — SODIUM CHLORIDE 0.9 % IV SOLN
25.0000 mg/m2 | Freq: Once | INTRAVENOUS | Status: AC
  Administered 2023-10-28: 50 mg via INTRAVENOUS
  Filled 2023-10-28: qty 50

## 2023-10-28 NOTE — Assessment & Plan Note (Signed)
 Intrahepatic cholangiocarcinoma, diagnosed following MRI and liver biopsy in May 2025.  He underwent ultrasound-guided biopsy of liver lesion on 07/29/2023.  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 CDX-2 positive, CK20 positive, CK7 positive, synaptophysin negative, chromogranin negative.  IHC profile suggested a gastrointestinal or pancreaticobiliary primary.  To look for a primary, patient underwent upper endoscopy and colonoscopy on 08/18/2023, by Dr. Avram.  EGD showed evidence of gastritis, single gastric polyp.  Otherwise unremarkable.  Colonoscopy showed several polyps in the transverse colon, sigmoid colon, descending colon.  Evidence of diverticulosis in the sigmoid colon.  Multiple biopsies were obtained.  Biopsies from the stomach, colon, cecum all showed benign findings.  No evidence of malignancy noted.  Clinical picture is consistent with intrahepatic cholangiocarcinoma, primarily located in the right lobe of the liver with disease limited to the liver area. A new small lesion on the dome of the left side of the liver noted on PET scan and also recent CT scan.  Too small to be biopsied.  Will have to monitor this on future scans.   His case was previously discussed in GI multidisciplinary tumor conference.  Dr. Dasie felt that he can be a surgical candidate following neoadjuvant chemotherapy.  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  Given history of autoimmune hepatitis, primary sclerosing cholangitis, he would not be a candidate for immunotherapy.  Hence plan would be to proceed with chemotherapy using cisplatin  and gemcitabine .  Request previously submitted for NGS testing on the specimen and liquid biopsy.  Following drainage of pelvic/anal abscess, once we have obtained clearance from the surgery  department, and once his C. difficile was cleared, he was started on systemic chemotherapy with cisplatin  and gemcitabine  from 09/28/2023.  Plan is to continue once weekly for two weeks on, one week off, depending on tolerance.   Tolerating chemotherapy reasonably well so far and he has noticed subjective improvement.  White blood cell count improved from 27,000 to 13,700, but remains elevated. Hemoglobin is stable at 12.6, platelets are normal, kidney function is stable, and liver enzymes are fluctuating but overall stable. Bilirubin is normal. He is tolerating treatment well with no nausea, vomiting, fevers, chills, night sweats, or diarrhea. He is eating well and reports no pain.Overall no dose-limiting toxicities.    Due for cycle 2-day 8 of chemotherapy today.  Will proceed with chemotherapy as scheduled.  Will skip G-CSF injection with this cycle.  He will be referred to Dr. Leonor Dasie for surgical evaluation following 3-4 cycles of chemotherapy.  - Monitor kidney function and ensure adequate hydration to prevent kidney dysfunction.  - Monitor blood counts, liver function, and for side effects such as nausea, fatigue, hearing loss, and neuropathy.  - Monitor CA 19-9 and CEA levels monthly.  RTC in 2 weeks for cycle 3-day 1 of chemotherapy.

## 2023-10-28 NOTE — Assessment & Plan Note (Signed)
 Colorectal fistula is healing well with minimal, manageable drainage.

## 2023-10-28 NOTE — Progress Notes (Signed)
 [facility]  ONCOLOGY CLINIC PROGRESS NOTE   Patient Care Team: [name], MD as PCP - General (Family Medicine) Avram Lupita BRAVO, MD as Attending Physician (Gastroenterology) Harvey Carlin BRAVO, MD (Inactive) as Attending Physician (Vascular Surgery) Ardis Evalene CROME, RN as Oncology Nurse Navigator Fatime Biswell, Chinita, MD as Consulting Physician (Hematology and Oncology)  PATIENT NAME: [name]   MR#: [id number] DOB: 09/07/1965  Date of visit: 10/28/2023   ASSESSMENT & PLAN:   [name] is a 58 y.o.  gentleman with a past medical history of primary sclerosing cholangitis, autoimmune hepatitis, Crohn's disease, anxiety/depression, PTSD, past C. difficile colitis, chronic pain syndrome, chronic mesenteric ischemia, basal cell carcinoma of left lower extremity, history of suicide attempt, past portal vein thrombosis, was referred to our clinic in June 2025 for newly diagnosed intrahepatic cholangiocarcinoma  Intrahepatic cholangiocarcinoma ([name]) Intrahepatic cholangiocarcinoma, diagnosed following MRI and liver biopsy in May 2025.  He underwent ultrasound-guided biopsy of liver lesion on 07/29/2023.  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 CDX-2 positive, CK20 positive, CK7 positive, synaptophysin negative, chromogranin negative.  IHC profile suggested a gastrointestinal or pancreaticobiliary primary.  To look for a primary, patient underwent upper endoscopy and colonoscopy on 08/18/2023, by Dr. Avram.  EGD showed evidence of gastritis, single gastric polyp.  Otherwise unremarkable.  Colonoscopy showed several polyps in the transverse colon, sigmoid colon, descending colon.  Evidence of diverticulosis in the sigmoid colon.  Multiple biopsies were obtained.  Biopsies from the stomach, colon, cecum all showed benign findings.  No evidence of malignancy noted.  Clinical picture  is consistent with intrahepatic cholangiocarcinoma, primarily located in the right lobe of the liver with disease limited to the liver area. A new small lesion on the dome of the left side of the liver noted on PET scan and also recent CT scan.  Too small to be biopsied.  Will have to monitor this on future scans.   His case was previously discussed in GI multidisciplinary tumor conference.  Dr. Dasie felt that he can be a surgical candidate following neoadjuvant chemotherapy.  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  Given history of autoimmune hepatitis, primary sclerosing cholangitis, he would not be a candidate for immunotherapy.  Hence plan would be to proceed with chemotherapy using cisplatin  and gemcitabine .  Request previously submitted for NGS testing on the specimen and liquid biopsy.  Following drainage of pelvic/anal abscess, once we have obtained clearance from the surgery department, and once his C. difficile was cleared, he was started on systemic chemotherapy with cisplatin  and gemcitabine  from 09/28/2023.  Plan is to continue once weekly for two weeks on, one week off, depending on tolerance.   Tolerating chemotherapy reasonably well so far and he has noticed subjective improvement.  White blood cell count improved from 27,000 to 13,700, but remains elevated. Hemoglobin is stable at 12.6, platelets are normal, kidney function is stable, and liver enzymes are fluctuating but overall stable. Bilirubin is normal. He is tolerating treatment well with no nausea, vomiting, fevers, chills, night sweats, or diarrhea. He is eating well and reports no pain.Overall no dose-limiting toxicities.    Due for cycle 2-day 8 of chemotherapy today.  Will proceed with chemotherapy as scheduled.  Will skip G-CSF injection with this cycle.  He will be referred to Dr. Leonor Dasie for surgical  evaluation following 3-4 cycles of chemotherapy.  -  Monitor kidney function and ensure adequate hydration to prevent kidney dysfunction.  - Monitor blood counts, liver function, and for side effects such as nausea, fatigue, hearing loss, and neuropathy.  - Monitor CA 19-9 and CEA levels monthly.  RTC in 2 weeks for cycle 3-day 1 of chemotherapy.   Anal fistula Colorectal fistula is healing well with minimal, manageable drainage.   History of Clostridioides difficile infection Clostridioides difficile infection has resolved. He reports no diarrhea for the past month and has cleared the 30-day period without a positive test for C. diff.  I reviewed lab results and outside records for this visit and discussed relevant results with the patient. Diagnosis, plan of care and treatment options were also discussed in detail with the patient. Opportunity provided to ask questions and answers provided to his apparent satisfaction. Provided instructions to call our clinic with any problems, questions or concerns prior to return visit. I recommended to continue follow-up with PCP and sub-specialists. He verbalized understanding and agreed with the plan.   NCCN guidelines have been consulted in the planning of this patient's care.  I spent a total of 30 minutes during this encounter with the patient including review of chart and various tests results, discussions about plan of care and coordination of care plan.   Chinita Patten, MD  10/28/2023 5:31 PM  [facility] - A DEPT OF [facility] [address] [place] [address] Dept: [phone] Dept Fax: [phone]    CHIEF COMPLAINT/ REASON FOR VISIT:   Intrahepatic cholangiocarcinoma, diagnosed in June 2025.  Current Treatment: Plan for systemic treatment with cisplatin  and gemcitabine , followed by surgical evaluation.  INTERVAL HISTORY:    Discussed the use of AI scribe  software for clinical note transcription with the patient, who gave verbal consent to proceed.  History of Present Illness  [name] is a 58 year old male undergoing cancer treatment who presents for follow-up.  He reports feeling better compared to when he started treatment. He has no nausea, vomiting, fevers, chills, night sweats, or pain. Bowel movements are regular, and he is eating well.  His white blood cell count, previously elevated at 27,000, has decreased to 13,700. Hemoglobin is stable at 12.6, and platelets are normal. Kidney function and electrolytes are within normal limits. Liver function tests show fluctuating AST and ALT levels, but alkaline phosphatase has improved to 238, and bilirubin remains normal.  He reports minimal drainage from his fistula, which he attributes to wiping, and denies any pain in that area.     I have reviewed the past medical history, past surgical history, social history and family history with the patient and they are unchanged from previous note.  HISTORY OF PRESENT ILLNESS:   ONCOLOGY HISTORY:   He has a history of autoimmune hepatitis, primary sclerosing cholangitis diagnosed in 2012, and Crohn's disease for which he is currently taking prednisone  and Entyvio .   He was admitted to the hospital on 07/02/2023 after he presented with complaints of multiple episodes of diarrhea, dizziness, near syncope.  CT scan of the abdomen and pelvis on 07/02/2023 showed new 3.7 x 2.8 cm noncystic lesion in the dome of the liver, which was not present on prior studies.  Given history of primary sclerosing cholangitis, there was concern for cholangiocarcinoma and MRI was recommended.   On 07/03/2023, MRI of the abdomen showed 4.3 x 2.8 x 3.7 cm lesion in the lateral aspect of  the hepatic dome which showed heterogeneous enhancement after IV contrast administration, suspicious for neoplasm.  Cholangiocarcinoma was considered because of his history of  primary sclerosing cholangitis.  Inferior and posterior to this lesion was a cyst measuring 5 x 4.5 cm, slightly increased in size compared to previous MRI.  Bile duct showed variable dilatation and stenosis.   Patient did not want to stay in the hospital for biopsy at that time.   He underwent ultrasound-guided biopsy of liver lesion on 07/29/2023.  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 CDX-2 positive, CK20 positive, CK7 positive, synaptophysin negative, chromogranin negative.  IHC profile suggested a gastrointestinal or pancreaticobiliary primary.   To look for a primary, patient underwent upper endoscopy and colonoscopy on 08/18/2023, by Dr. Avram.  EGD showed evidence of gastritis, single gastric polyp.  Otherwise unremarkable.  Colonoscopy showed several polyps in the transverse colon, sigmoid colon, descending colon.  Evidence of diverticulosis in the sigmoid colon.  Multiple biopsies were obtained.  Biopsies from the stomach, colon, cecum all showed benign findings.  No evidence of malignancy noted.   Clinical picture is consistent with intrahepatic cholangiocarcinoma.   Given history of autoimmune hepatitis, primary sclerosing cholangitis, he would not be a candidate for immunotherapy.  Hence plan would be to proceed with chemotherapy using cisplatin  and gemcitabine .   Request submitted for NGS testing on the specimen and liquid biopsy.  Liquid biopsy showed no actionable mutations.   On 09/09/2023, staging PET scan showed marginal areas of uptake along the mass lesion in the posterior superior right hepatic lobe, corresponding to the area of known neoplasm.  There were small cystic areas elsewhere in the liver, new small low-density focus in the dome of left hepatic lobe.  No appreciable abnormal uptake.  No areas of abnormal uptake seen above the diaphragm.  Findings consistent with anal fistula noted.  His case  was previously discussed in GI multidisciplinary tumor conference.  Dr. Dasie felt that he can be a surgical candidate following neoadjuvant chemotherapy.  Following drainage of pelvic/anal abscess, once we have obtained clearance from the surgery department, and once his C. difficile was cleared, he was started on systemic chemotherapy with cisplatin  and gemcitabine  from 09/28/2023.  Plan is to continue once weekly for two weeks on, one week off, depending on tolerance.   He will be referred to Dr. Leonor Dasie for surgical evaluation following 3-4 cycles of chemotherapy.  Oncology History  Intrahepatic cholangiocarcinoma ([name])  08/18/2023 Initial Diagnosis   Cholangiocarcinoma metastatic to liver ([name])   08/26/2023 Cancer Staging   Staging form: Intrahepatic Bile Duct, AJCC 8th Edition - Clinical: Stage II (cT2, cN0, cM0) - Signed by Autumn Millman, MD on 09/15/2023   09/28/2023 -  Chemotherapy   Patient is on Treatment Plan : BILIARY TRACT Cisplatin  + Gemcitabine  D1,8 q21d         REVIEW OF SYSTEMS:   Review of Systems - Oncology  All other pertinent systems were reviewed with the patient and are negative.  ALLERGIES: He is allergic to tape, asacol  [mesalamine ], azathioprine, gluten meal, metoprolol  tartrate, mycophenolate mofetil, other, wheat, abilify  [aripiprazole ], and amoxicillin .  MEDICATIONS:  Current Outpatient Medications  Medication Sig Dispense Refill   Cholecalciferol 50 MCG (2000 UT) CAPS Take 1 capsule by mouth daily.     dexamethasone  (DECADRON ) 4 MG tablet Take 2 tablets (8 mg) by mouth daily x 3 days starting the day after cisplatin  chemotherapy. Take with food. 30 tablet 1  ferrous sulfate  325 (65 FE) MG tablet Take 325 mg by mouth daily with breakfast. Continues to take per low HGB     hydrOXYzine  (ATARAX ) 25 MG tablet Take 1 tablet (25 mg total) by mouth 3 (three) times daily as needed for anxiety. 30 tablet 0   lidocaine -prilocaine  (EMLA ) cream Apply to  affected area once 30 g 3   Magnesium  125 MG CAPS One at bedtime     mirtazapine  (REMERON ) 30 MG tablet Take 0.5 tablets (15 mg total) by mouth at bedtime.     Nutritional Supplements (ENSURE HIGH PROTEIN) LIQD Take 237 mLs by mouth in the morning and at bedtime.     ondansetron  (ZOFRAN ) 8 MG tablet Take 1 tablet (8 mg total) by mouth every 8 (eight) hours as needed for nausea or vomiting. Start on the third day after cisplatin . 30 tablet 1   Potassium 99 MG TABS Take 1 tablet by mouth daily.     predniSONE  (DELTASONE ) 20 MG tablet Take 1 tablet (20 mg total) by mouth daily with breakfast. 90 tablet 1   prochlorperazine  (COMPAZINE ) 10 MG tablet Take 1 tablet (10 mg total) by mouth every 6 (six) hours as needed (Nausea or vomiting). 30 tablet 1   Vedolizumab  (ENTYVIO  IV) Inject into the vein every 6 (six) weeks.     venlafaxine  XR (EFFEXOR -XR) 150 MG 24 hr capsule Take 1 capsule (150 mg total) by mouth daily with breakfast. 30 capsule 0   venlafaxine  XR (EFFEXOR -XR) 75 MG 24 hr capsule Take 75 mg by mouth daily.     No current facility-administered medications for this visit.   Facility-Administered Medications Ordered in Other Visits  Medication Dose Route Frequency Provider Last Rate Last Admin   0.9 %  sodium chloride  infusion   Intravenous Continuous Enedelia Martorelli, MD       0.9 %  sodium chloride  infusion   Intravenous Continuous Sydnee Lamour, MD   Stopped at 10/28/23 1435   sodium chloride  flush (NS) 0.9 % injection 10 mL  10 mL Intracatheter PRN Jamieson Hetland, MD         VITALS:   Blood pressure 117/77, pulse 90, temperature 97.7 F (36.5 C), temperature source Temporal, resp. rate 17, height 6' 1.5 (1.867 m), weight 165 lb (74.8 kg), SpO2 100%.  Wt Readings from Last 3 Encounters:  10/28/23 165 lb (74.8 kg)  10/19/23 165 lb (74.8 kg)  10/19/23 166 lb 9.6 oz (75.6 kg)    Body mass index is 21.47 kg/m.    Onc Performance Status - 10/28/23 0907       ECOG Perf Status    ECOG Perf Status Fully active, able to carry on all pre-disease performance without restriction      KPS SCALE   KPS % SCORE Normal, no compliants, no evidence of disease           PHYSICAL EXAM:   Physical Exam Constitutional:      General: He is not in acute distress.    Appearance: Normal appearance.  HENT:     Head: Normocephalic and atraumatic.  Eyes:     Conjunctiva/sclera: Conjunctivae normal.  Cardiovascular:     Rate and Rhythm: Normal rate and regular rhythm.  Pulmonary:     Effort: Pulmonary effort is normal. No respiratory distress.  Chest:     Comments: Port-A-Cath in place without any signs of infection Abdominal:     General: There is no distension.  Neurological:     General: No focal deficit  present.     Mental Status: He is alert and oriented to person, place, and time.  Psychiatric:        Mood and Affect: Mood normal.        Behavior: Behavior normal.       LABORATORY DATA:   I have reviewed the data as listed.  Results for orders placed or performed in visit on 10/28/23  CMP (Cancer Center only)  Result Value Ref Range   Sodium 137 135 - 145 mmol/L   Potassium 3.7 3.5 - 5.1 mmol/L   Chloride 103 98 - 111 mmol/L   CO2 28 22 - 32 mmol/L   Glucose, Bld 96 70 - 99 mg/dL   BUN 25 (H) 6 - 20 mg/dL   Creatinine 9.05 9.38 - 1.24 mg/dL   Calcium 8.6 (L) 8.9 - 10.3 mg/dL   Total Protein 5.8 (L) 6.5 - 8.1 g/dL   Albumin 3.3 (L) 3.5 - 5.0 g/dL   AST 75 (H) 15 - 41 U/L   ALT 159 (H) 0 - 44 U/L   Alkaline Phosphatase 238 (H) 38 - 126 U/L   Total Bilirubin 0.3 0.0 - 1.2 mg/dL   GFR, Estimated >39 >39 mL/min   Anion gap 6 5 - 15  Magnesium   Result Value Ref Range   Magnesium  1.9 1.7 - 2.4 mg/dL  CBC with Differential (Cancer Center Only)  Result Value Ref Range   WBC Count 13.7 (H) 4.0 - 10.5 K/uL   RBC 4.13 (L) 4.22 - 5.81 MIL/uL   Hemoglobin 12.6 (L) 13.0 - 17.0 g/dL   HCT 61.9 (L) 60.9 - 47.9 %   MCV 92.0 80.0 - 100.0 fL   MCH 30.5 26.0 -  34.0 pg   MCHC 33.2 30.0 - 36.0 g/dL   RDW 84.3 (H) 88.4 - 84.4 %   Platelet Count 155 150 - 400 K/uL   nRBC 0.0 0.0 - 0.2 %   Neutrophils Relative % 72 %   Neutro Abs 10.0 (H) 1.7 - 7.7 K/uL   Lymphocytes Relative 12 %   Lymphs Abs 1.6 0.7 - 4.0 K/uL   Monocytes Relative 12 %   Monocytes Absolute 1.6 (H) 0.1 - 1.0 K/uL   Eosinophils Relative 0 %   Eosinophils Absolute 0.0 0.0 - 0.5 K/uL   Basophils Relative 1 %   Basophils Absolute 0.1 0.0 - 0.1 K/uL   Immature Granulocytes 3 %   Abs Immature Granulocytes 0.37 (H) 0.00 - 0.07 K/uL       RADIOGRAPHIC STUDIES:  No recent pertinent imaging available to review.  CODE STATUS:  Code Status History     Date Active Date Inactive Code Status Order ID Comments User Context   09/14/2023 1143 09/15/2023 0511 Full Code [id number]  Johann Sieving, MD HOV   08/20/2023 1401 08/23/2023 1406 Full Code [id number]  Lou Claretta HERO, MD ED   07/29/2023 1419 07/30/2023 0518 Full Code [id number]  Johann Sieving, MD HOV   07/02/2023 1533 07/05/2023 2312 Full Code [id number]  Celinda Alm Lot, MD ED   04/13/2023 1949 05/20/2023 1942 Full Code [id number]  Tony Cathaleen LABOR, PMHNP Inpatient   03/28/2023 2234 04/04/2023 1348 Full Code [id number]  Alan Thurman GAILS, NP Inpatient   03/28/2023 1136 03/28/2023 2111 Full Code [id number]  Minnie Tinnie BRAVO, PA ED   08/26/2018 1234 09/01/2018 2105 Full Code [id number]  Arloa Chroman, PA-C ED   08/25/2018 1240 08/25/2018 2000 Full Code [id number]  Little, Vernell Search, MD ED   07/31/2018  1406 08/01/2018 1446 Full Code [id number]  Zada Elouise JAYSON DEVONNA ED   07/19/2018 2045 07/21/2018 1739 DNR [id number]  [place], Eric J, [place] Inpatient   05/28/2018 1506 06/01/2018 1222 DNR [id number]  Barbarann Nest, MD Inpatient   05/20/2012 1059 05/25/2012 1606 DNR [id number]  Cherisse Ronal ORN, NP Inpatient   05/18/2012 1723 05/20/2012 1059 Full Code [id number]  Juvenal Harlene PENNER, DO Inpatient    Questions for Most Recent Historical Code Status (Order [id number])      Question Answer   By: Other            Orders Placed This Encounter  Procedures   CBC with Differential (Cancer Center Only)    Standing Status:   Future    Expected Date:   12/01/2023    Expiration Date:   11/30/2024   CMP (Cancer Center only)    Standing Status:   Future    Expected Date:   12/01/2023    Expiration Date:   11/30/2024   Magnesium     Standing Status:   Future    Expected Date:   12/01/2023    Expiration Date:   11/30/2024   CBC with Differential (Cancer Center Only)    Standing Status:   Future    Expected Date:   12/08/2023    Expiration Date:   12/07/2024   CMP (Cancer Center only)    Standing Status:   Future    Expected Date:   12/08/2023    Expiration Date:   12/07/2024   Magnesium     Standing Status:   Future    Expected Date:   12/08/2023    Expiration Date:   12/07/2024     Future Appointments  Date Time Provider Department Center  11/11/2023  8:30 AM [facility] FLUSH [facility] None  11/11/2023  9:00 AM Shalynn Jorstad, MD [facility] None  11/11/2023  9:30 AM [facility] INFUSION [facility] None  11/17/2023  8:45 AM [facility] FLUSH [facility] None  11/17/2023  9:30 AM Saisha Hogue, MD [facility] None  11/17/2023 10:00 AM [facility] INFUSION [facility] None  11/21/2023 12:00 PM [facility] FLUSH [facility] None      This document was completed utilizing speech recognition software. Grammatical errors, random word insertions, pronoun errors, and incomplete sentences are an occasional consequence of this system due to software limitations, ambient noise, and hardware issues. Any formal questions or concerns about the content, text or information contained within the body of this dictation should be directly addressed to the provider for clarification.

## 2023-11-01 ENCOUNTER — Inpatient Hospital Stay

## 2023-11-07 DIAGNOSIS — C44712 Basal cell carcinoma of skin of right lower limb, including hip: Secondary | ICD-10-CM | POA: Diagnosis not present

## 2023-11-07 DIAGNOSIS — Z85828 Personal history of other malignant neoplasm of skin: Secondary | ICD-10-CM | POA: Diagnosis not present

## 2023-11-09 DIAGNOSIS — M9903 Segmental and somatic dysfunction of lumbar region: Secondary | ICD-10-CM | POA: Diagnosis not present

## 2023-11-10 ENCOUNTER — Encounter: Payer: Self-pay | Admitting: Internal Medicine

## 2023-11-11 ENCOUNTER — Inpatient Hospital Stay: Admitting: Oncology

## 2023-11-11 ENCOUNTER — Inpatient Hospital Stay

## 2023-11-11 ENCOUNTER — Telehealth: Payer: Self-pay

## 2023-11-11 ENCOUNTER — Encounter: Payer: Self-pay | Admitting: Oncology

## 2023-11-11 MED ORDER — VANCOMYCIN HCL 125 MG PO CAPS: 125.0000 mg | ORAL_CAPSULE | Freq: Four times a day (QID) | ORAL | 0 refills | Status: AC

## 2023-11-11 MED ORDER — FIDAXOMICIN 200 MG PO TABS: 200.0000 mg | ORAL_TABLET | Freq: Two times a day (BID) | ORAL | 0 refills | Status: DC

## 2023-11-11 NOTE — Telephone Encounter (Signed)
 Spoke to patient via phone conversation who reported having S/S of C-Diff, which he has had several times in recent past. Oncology team notified along with Dr. Avram, GI. All Oncology appointments cancelled today.

## 2023-11-11 NOTE — Telephone Encounter (Signed)
 Contacted by oncology team and patient through MyChart.  Diarrhea for about 36 hours watery started after taking Keflex which was prophylactic after Moh's surgery.  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  I think appropriate to retreat with Dificid .  Prescription sent.  He will monitor symptoms and update.

## 2023-11-11 NOTE — Addendum Note (Signed)
 Addended by: NICHOLAUS JARVIS on: 11/11/2023 11:31 AM   Modules accepted: Orders

## 2023-11-11 NOTE — Telephone Encounter (Signed)
 Spoke with [facility] in [place] and [facility]. Neither pharmacy has Dificid  in stock. Will send new Rx for Vancomycin  125 mg QID per Dr. Avram orders.

## 2023-11-15 ENCOUNTER — Telehealth: Payer: Self-pay | Admitting: Oncology

## 2023-11-15 ENCOUNTER — Other Ambulatory Visit: Payer: Self-pay | Admitting: Oncology

## 2023-11-15 DIAGNOSIS — C221 Intrahepatic bile duct carcinoma: Secondary | ICD-10-CM

## 2023-11-15 NOTE — Telephone Encounter (Signed)
 Scheduled appointments per  9/16 secure chat. Talked with the patient and he is aware of all made appointments.

## 2023-11-16 ENCOUNTER — Other Ambulatory Visit: Payer: Self-pay

## 2023-11-17 ENCOUNTER — Other Ambulatory Visit

## 2023-11-17 ENCOUNTER — Inpatient Hospital Stay

## 2023-11-17 ENCOUNTER — Inpatient Hospital Stay: Admitting: Oncology

## 2023-11-21 ENCOUNTER — Inpatient Hospital Stay

## 2023-11-21 ENCOUNTER — Ambulatory Visit

## 2023-11-22 DIAGNOSIS — M9903 Segmental and somatic dysfunction of lumbar region: Secondary | ICD-10-CM | POA: Diagnosis not present

## 2023-11-23 ENCOUNTER — Inpatient Hospital Stay

## 2023-11-23 ENCOUNTER — Inpatient Hospital Stay: Attending: Oncology

## 2023-11-23 ENCOUNTER — Encounter: Payer: Self-pay | Admitting: Oncology

## 2023-11-23 ENCOUNTER — Inpatient Hospital Stay (HOSPITAL_BASED_OUTPATIENT_CLINIC_OR_DEPARTMENT_OTHER): Admitting: Oncology

## 2023-11-23 VITALS — BP 124/81 | HR 88 | Temp 98.2°F | Resp 17 | Ht 73.5 in | Wt 172.0 lb

## 2023-11-23 DIAGNOSIS — Z5111 Encounter for antineoplastic chemotherapy: Secondary | ICD-10-CM | POA: Diagnosis not present

## 2023-11-23 DIAGNOSIS — C221 Intrahepatic bile duct carcinoma: Secondary | ICD-10-CM | POA: Insufficient documentation

## 2023-11-23 DIAGNOSIS — Z79899 Other long term (current) drug therapy: Secondary | ICD-10-CM | POA: Diagnosis not present

## 2023-11-23 DIAGNOSIS — R978 Other abnormal tumor markers: Secondary | ICD-10-CM | POA: Diagnosis not present

## 2023-11-23 LAB — CMP (CANCER CENTER ONLY)
ALT: 63 U/L — ABNORMAL HIGH (ref 0–44)
AST: 39 U/L (ref 15–41)
Albumin: 3.7 g/dL (ref 3.5–5.0)
Alkaline Phosphatase: 121 U/L (ref 38–126)
Anion gap: 4 — ABNORMAL LOW (ref 5–15)
BUN: 29 mg/dL — ABNORMAL HIGH (ref 6–20)
CO2: 31 mmol/L (ref 22–32)
Calcium: 9 mg/dL (ref 8.9–10.3)
Chloride: 103 mmol/L (ref 98–111)
Creatinine: 1.16 mg/dL (ref 0.61–1.24)
GFR, Estimated: >60 mL/min (ref >60)
Glucose, Bld: 88 mg/dL (ref 70–99)
Potassium: 4.2 mmol/L (ref 3.5–5.1)
Sodium: 138 mmol/L (ref 135–145)
Total Bilirubin: 0.4 mg/dL (ref 0.0–1.2)
Total Protein: 6.3 g/dL — ABNORMAL LOW (ref 6.5–8.1)

## 2023-11-23 LAB — CBC WITH DIFFERENTIAL (CANCER CENTER ONLY)
Abs Immature Granulocytes: 0.52 K/uL — ABNORMAL HIGH (ref 0.00–0.07)
Basophils Absolute: 0.1 K/uL (ref 0.0–0.1)
Basophils Relative: 1 %
Eosinophils Absolute: 0 K/uL (ref 0.0–0.5)
Eosinophils Relative: 0 %
HCT: 42.6 % (ref 39.0–52.0)
Hemoglobin: 14.3 g/dL (ref 13.0–17.0)
Immature Granulocytes: 3 %
Lymphocytes Relative: 4 %
Lymphs Abs: 0.8 K/uL (ref 0.7–4.0)
MCH: 30.2 pg (ref 26.0–34.0)
MCHC: 33.6 g/dL (ref 30.0–36.0)
MCV: 90.1 fL (ref 80.0–100.0)
Monocytes Absolute: 1.4 K/uL — ABNORMAL HIGH (ref 0.1–1.0)
Monocytes Relative: 7 %
Neutro Abs: 17.7 K/uL — ABNORMAL HIGH (ref 1.7–7.7)
Neutrophils Relative %: 85 %
Platelet Count: 168 K/uL (ref 150–400)
RBC: 4.73 MIL/uL (ref 4.22–5.81)
RDW: 16.8 % — ABNORMAL HIGH (ref 11.5–15.5)
WBC Count: 20.6 K/uL — ABNORMAL HIGH (ref 4.0–10.5)
nRBC: 0 % (ref 0.0–0.2)

## 2023-11-23 LAB — CEA (ACCESS): CEA (CHCC): 114.55 ng/mL — ABNORMAL HIGH (ref 0.00–5.00)

## 2023-11-23 LAB — MAGNESIUM: Magnesium: 2.1 mg/dL (ref 1.7–2.4)

## 2023-11-23 MED ORDER — POTASSIUM CHLORIDE IN NACL 20-0.9 MEQ/L-% IV SOLN
Freq: Once | INTRAVENOUS | Status: AC
  Filled 2023-11-23: qty 1000

## 2023-11-23 MED ORDER — SODIUM CHLORIDE 0.9 % IV SOLN
1000.0000 mg/m2 | Freq: Once | INTRAVENOUS | Status: AC
  Administered 2023-11-23: 1938 mg via INTRAVENOUS
  Filled 2023-11-23: qty 50.97

## 2023-11-23 MED ORDER — MAGNESIUM SULFATE 2 GM/50ML IV SOLN
2.0000 g | Freq: Once | INTRAVENOUS | Status: AC
  Administered 2023-11-23: 2 g via INTRAVENOUS
  Filled 2023-11-23: qty 50

## 2023-11-23 MED ORDER — PALONOSETRON HCL INJECTION 0.25 MG/5ML
0.2500 mg | Freq: Once | INTRAVENOUS | Status: AC
  Administered 2023-11-23: 0.25 mg via INTRAVENOUS
  Filled 2023-11-23: qty 5

## 2023-11-23 MED ORDER — SODIUM CHLORIDE 0.9 % IV SOLN: INTRAVENOUS | Status: DC

## 2023-11-23 MED ORDER — DEXAMETHASONE SODIUM PHOSPHATE 10 MG/ML IJ SOLN
10.0000 mg | Freq: Once | INTRAMUSCULAR | Status: AC
  Administered 2023-11-23: 10 mg via INTRAVENOUS
  Filled 2023-11-23: qty 1

## 2023-11-23 MED ORDER — APREPITANT 130 MG/18ML IV EMUL
130.0000 mg | Freq: Once | INTRAVENOUS | Status: AC
  Administered 2023-11-23: 130 mg via INTRAVENOUS
  Filled 2023-11-23: qty 18

## 2023-11-23 MED ORDER — SODIUM CHLORIDE 0.9 % IV SOLN
25.0000 mg/m2 | Freq: Once | INTRAVENOUS | Status: AC
  Administered 2023-11-23: 50 mg via INTRAVENOUS
  Filled 2023-11-23: qty 50

## 2023-11-23 NOTE — Patient Instructions (Signed)
 [facility] - A DEPT OF [facility]  Discharge Instructions: Thank you for choosing [facility] to provide your oncology and hematology care.   If you have a lab appointment with the Cancer Center, please go directly to the Cancer Center and check in at the registration area.   Wear comfortable clothing and clothing appropriate for easy access to any Portacath or PICC line.   We strive to give you quality time with your provider. You may need to reschedule your appointment if you arrive late (15 or more minutes).  Arriving late affects you and other patients whose appointments are after yours.  Also, if you miss three or more appointments without notifying the office, you may be dismissed from the clinic at the provider's discretion.      For prescription refill requests, have your pharmacy contact our office and allow 72 hours for refills to be completed.    Today you received the following chemotherapy and/or immunotherapy agents: gemzar /cisplatin       To help prevent nausea and vomiting after your treatment, we encourage you to take your nausea medication as directed.  BELOW ARE SYMPTOMS THAT SHOULD BE REPORTED IMMEDIATELY: *FEVER GREATER THAN 100.4 F (38 C) OR HIGHER *CHILLS OR SWEATING *NAUSEA AND VOMITING THAT IS NOT CONTROLLED WITH YOUR NAUSEA MEDICATION *UNUSUAL SHORTNESS OF BREATH *UNUSUAL BRUISING OR BLEEDING *URINARY PROBLEMS (pain or burning when urinating, or frequent urination) *BOWEL PROBLEMS (unusual diarrhea, constipation, pain near the anus) TENDERNESS IN MOUTH AND THROAT WITH OR WITHOUT PRESENCE OF ULCERS (sore throat, sores in mouth, or a toothache) UNUSUAL RASH, SWELLING OR PAIN  UNUSUAL VAGINAL DISCHARGE OR ITCHING   Items with * indicate a potential emergency and should be followed up as soon as possible or go to the Emergency Department if any problems should occur.  Please show the CHEMOTHERAPY ALERT CARD or  IMMUNOTHERAPY ALERT CARD at check-in to the Emergency Department and triage nurse.  Should you have questions after your visit or need to cancel or reschedule your appointment, please contact [facility] - A DEPT [facility]  Dept: [phone]  and follow the prompts.  Office hours are 8:00 a.m. to 4:30 p.m. Monday - Friday. Please note that voicemails left after 4:00 p.m. may not be returned until the following business day.  We are closed weekends and major holidays. You have access to a nurse at all times for urgent questions. Please call the main number to the clinic Dept: [phone] and follow the prompts.   For any non-urgent questions, you may also contact your provider using MyChart. We now offer e-Visits for anyone 60 and older to request care online for non-urgent symptoms. For details visit [email or web address].   Also download the MyChart app! Go to the app store, search MyChart, open the app, select [place], and log in with your MyChart username and password.

## 2023-11-23 NOTE — Assessment & Plan Note (Addendum)
 Intrahepatic cholangiocarcinoma, diagnosed following MRI and liver biopsy in May 2025.  He underwent ultrasound-guided biopsy of liver lesion on 07/29/2023.  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 CDX-2 positive, CK20 positive, CK7 positive, synaptophysin negative, chromogranin negative.  IHC profile suggested a gastrointestinal or pancreaticobiliary primary.  To look for a primary, patient underwent upper endoscopy and colonoscopy on 08/18/2023, by Dr. Avram.  EGD showed evidence of gastritis, single gastric polyp.  Otherwise unremarkable.  Colonoscopy showed several polyps in the transverse colon, sigmoid colon, descending colon.  Evidence of diverticulosis in the sigmoid colon.  Multiple biopsies were obtained.  Biopsies from the stomach, colon, cecum all showed benign findings.  No evidence of malignancy noted.  Clinical picture is consistent with intrahepatic cholangiocarcinoma, primarily located in the right lobe of the liver with disease limited to the liver area. A new small lesion on the dome of the left side of the liver noted on PET scan and also recent CT scan.  Too small to be biopsied.  Will have to monitor this on future scans.   His case was previously discussed in GI multidisciplinary tumor conference.  Dr. Dasie felt that he can be a surgical candidate following neoadjuvant chemotherapy.  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  Given history of autoimmune hepatitis, primary sclerosing cholangitis, he would not be a candidate for immunotherapy.  Hence plan would be to proceed with chemotherapy using cisplatin  and gemcitabine .  Request previously submitted for NGS testing on the specimen and liquid biopsy.  Following drainage of pelvic/anal abscess, once we have obtained clearance from the surgery  department, and once his C. difficile was cleared, he was started on systemic chemotherapy with cisplatin  and gemcitabine  from 09/28/2023.  Plan is to continue once weekly for two weeks on, one week off, depending on tolerance.   Tolerating chemotherapy reasonably well so far and he has noticed subjective improvement.  White blood cell count improved from 27,000 to 20,000 but remains elevated. Hemoglobin is better at 14.3, platelets are normal, kidney function is stable, and liver enzymes are fluctuating but overall stable. Bilirubin is normal. He is tolerating treatment well with no nausea, vomiting, fevers, chills, night sweats, or diarrhea. He is eating well and reports no pain.Overall no dose-limiting toxicities.    Due for cycle 3-day 1 of chemotherapy today.  Will proceed with chemotherapy as scheduled.  Depending on labs next week, we will consider skipping G-CSF with current cycle also.  He will be referred to Dr. Leonor Dasie for surgical evaluation following 3-4 cycles of chemotherapy.  - Monitor kidney function and ensure adequate hydration to prevent kidney dysfunction.  - Monitor blood counts, liver function, and for side effects such as nausea, fatigue, hearing loss, and neuropathy.  - Monitor CA 19-9 and CEA levels monthly.  RTC in 1 week for cycle 3-day 8 of chemotherapy.

## 2023-11-23 NOTE — Progress Notes (Signed)
 [facility]  ONCOLOGY CLINIC PROGRESS NOTE   Patient Care Team: [name] Carlin Redbird, [name] as PCP - General (Family Medicine) [name] Lupita BRAVO, [name] as Attending Physician (Gastroenterology) [name] Carlin BRAVO, [name] (Inactive) as Attending Physician (Vascular Surgery) [name] Evalene CROME, RN as Oncology Nurse Navigator [name] as Consulting Physician (Hematology and Oncology)  PATIENT NAME: [name]   MR#: [id number] DOB: [birth year]  Date of visit: 11/23/2023   ASSESSMENT & PLAN:   [name] is a 58 y.o.  gentleman with a past medical history of primary sclerosing cholangitis, autoimmune hepatitis, Crohn's disease, anxiety/depression, PTSD, past C. difficile colitis, chronic pain syndrome, chronic mesenteric ischemia, basal cell carcinoma of left lower extremity, history of suicide attempt, past portal vein thrombosis, was referred to our clinic in June 2025 for newly diagnosed intrahepatic cholangiocarcinoma  Intrahepatic cholangiocarcinoma ([name]) Intrahepatic cholangiocarcinoma, diagnosed following MRI and liver biopsy in May 2025.  He underwent ultrasound-guided biopsy of liver lesion on 07/29/2023.  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 CDX-2 positive, CK20 positive, CK7 positive, synaptophysin negative, chromogranin negative.  IHC profile suggested a gastrointestinal or pancreaticobiliary primary.  To look for a primary, patient underwent upper endoscopy and colonoscopy on 08/18/2023, by Dr. Avram.  EGD showed evidence of gastritis, single gastric polyp.  Otherwise unremarkable.  Colonoscopy showed several polyps in the transverse colon, sigmoid colon, descending colon.  Evidence of diverticulosis in the sigmoid colon.  Multiple biopsies were obtained.  Biopsies from the stomach, colon, cecum all showed benign findings.  No evidence of malignancy noted.  Clinical picture  is consistent with intrahepatic cholangiocarcinoma, primarily located in the right lobe of the liver with disease limited to the liver area. A new small lesion on the dome of the left side of the liver noted on PET scan and also recent CT scan.  Too small to be biopsied.  Will have to monitor this on future scans.   His case was previously discussed in GI multidisciplinary tumor conference.  [name] felt that he can be a surgical candidate following neoadjuvant chemotherapy.  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  Given history of autoimmune hepatitis, primary sclerosing cholangitis, he would not be a candidate for immunotherapy.  Hence plan would be to proceed with chemotherapy using cisplatin  and gemcitabine .  Request previously submitted for NGS testing on the specimen and liquid biopsy.  Following drainage of pelvic/anal abscess, once we have obtained clearance from the surgery department, and once his C. difficile was cleared, he was started on systemic chemotherapy with cisplatin  and gemcitabine  from 09/28/2023.  Plan is to continue once weekly for two weeks on, one week off, depending on tolerance.   Tolerating chemotherapy reasonably well so far and he has noticed subjective improvement.  White blood cell count improved from 27,000 to 20,000 but remains elevated. Hemoglobin is better at 14.3, platelets are normal, kidney function is stable, and liver enzymes are fluctuating but overall stable. Bilirubin is normal. He is tolerating treatment well with no nausea, vomiting, fevers, chills, night sweats, or diarrhea. He is eating well and reports no pain.Overall no dose-limiting toxicities.    Due for cycle 3-day 1 of chemotherapy today.  Will proceed with chemotherapy as scheduled.  Depending on labs next week, we will consider skipping G-CSF with current cycle also.  He will be referred  to Dr. Leonor [name] for surgical  evaluation following 3-4 cycles of chemotherapy.  - Monitor kidney function and ensure adequate hydration to prevent kidney dysfunction.  - Monitor blood counts, liver function, and for side effects such as nausea, fatigue, hearing loss, and neuropathy.  - Monitor CA 19-9 and CEA levels monthly.  RTC in 1 week for cycle 3-day 8 of chemotherapy.   History of Clostridioides difficile infection Clostridioides difficile infection has resolved.   I reviewed lab results and outside records for this visit and discussed relevant results with the patient. Diagnosis, plan of care and treatment options were also discussed in detail with the patient. Opportunity provided to ask questions and answers provided to his apparent satisfaction. Provided instructions to call our clinic with any problems, questions or concerns prior to return visit. I recommended to continue follow-up with PCP and sub-specialists. He verbalized understanding and agreed with the plan.   NCCN guidelines have been consulted in the planning of this patient's care.  I spent a total of 30 minutes during this encounter with the patient including review of chart and various tests results, discussions about plan of care and coordination of care plan.   Chinita Patten, [name]  11/23/2023 5:48 PM  [facility] - [facility] DE[facility] [address] [place] [address] Dept: [phone] Dept Fax: [phone]    CHIEF COMPLAINT/ REASON FOR VISIT:   Intrahepatic cholangiocarcinoma, diagnosed in June 2025.  Current Treatment: Plan for systemic treatment with cisplatin  and gemcitabine , followed by surgical evaluation.  Started on systemic chemotherapy with cisplatin  and gemcitabine  from 09/28/2023.  INTERVAL HISTORY:    Discussed the use of AI scribe software for clinical note transcription with the patient, who gave verbal consent to  proceed.  History of Present Illness  [name] is a 58 year old male who presents for follow-up of his treatment regimen.  Diarrhea has improved since the last visit. He has no fever, chills, or night sweats. He has been eating well and has gained weight, currently at 172 pounds. No nausea or vomiting.  White blood cell count decreased from 27,000 to 20,000. He feels better and tolerates the treatments well.   I have reviewed the past medical history, past surgical history, social history and family history with the patient and they are unchanged from previous note.  HISTORY OF PRESENT ILLNESS:   ONCOLOGY HISTORY:   He has a history of autoimmune hepatitis, primary sclerosing cholangitis diagnosed in 2012, and Crohn's disease for which he is currently taking prednisone  and Entyvio .   He was admitted to the hospital on 07/02/2023 after he presented with complaints of multiple episodes of diarrhea, dizziness, near syncope.  CT scan of the abdomen and pelvis on 07/02/2023 showed new 3.7 x 2.8 cm noncystic lesion in the dome of the liver, which was not present on prior studies.  Given history of primary sclerosing cholangitis, there was concern for cholangiocarcinoma and MRI was recommended.   On 07/03/2023, MRI of the abdomen showed 4.3 x 2.8 x 3.7 cm lesion in the lateral aspect of the hepatic dome which showed heterogeneous enhancement after IV contrast administration, suspicious for neoplasm.  Cholangiocarcinoma was considered because of his history of primary sclerosing cholangitis.  Inferior and posterior to this lesion was a cyst measuring 5 x 4.5 cm, slightly increased in size compared to previous MRI.  Bile duct showed variable dilatation and stenosis.   Patient did not want to stay in  the hospital for biopsy at that time.   He underwent ultrasound-guided biopsy of liver lesion on 07/29/2023.  Pathology showed poorly differentiated adenocarcinoma with signet ring cell  features, favoring a metastatic lesion. CDX-2 positive, CK20 positive, CK7 positive, synaptophysin negative, chromogranin negative.  IHC profile suggested a gastrointestinal or pancreaticobiliary primary.   To look for a primary, patient underwent upper endoscopy and colonoscopy on 08/18/2023, by Dr. Avram.  EGD showed evidence of gastritis, single gastric polyp.  Otherwise unremarkable.  Colonoscopy showed several polyps in the transverse colon, sigmoid colon, descending colon.  Evidence of diverticulosis in the sigmoid colon.  Multiple biopsies were obtained.  Biopsies from the stomach, colon, cecum all showed benign findings.  No evidence of malignancy noted.   Clinical picture is consistent with intrahepatic cholangiocarcinoma.   Given history of autoimmune hepatitis, primary sclerosing cholangitis, he would not be a candidate for immunotherapy.  Hence plan would be to proceed with chemotherapy using cisplatin  and gemcitabine .   Request submitted for NGS testing on the specimen and liquid biopsy.  Liquid biopsy showed no actionable mutations.   On 09/09/2023, staging PET scan showed marginal areas of uptake along the mass lesion in the posterior superior right hepatic lobe, corresponding to the area of known neoplasm.  There were small cystic areas elsewhere in the liver, new small low-density focus in the dome of left hepatic lobe.  No appreciable abnormal uptake.  No areas of abnormal uptake seen above the diaphragm.  Findings consistent with anal fistula noted.  His case was previously discussed in GI multidisciplinary tumor conference.  [name] felt that he can be a surgical candidate following neoadjuvant chemotherapy.  Following drainage of pelvic/anal abscess, once we have obtained clearance from the surgery department, and once his C. difficile was cleared, he was started on systemic chemotherapy with cisplatin  and gemcitabine  from 09/28/2023.  Plan is to continue once weekly for two weeks  on, one week off, depending on tolerance.   He will be referred to Dr. Leonor [name] for surgical evaluation following 3-4 cycles of chemotherapy.  Oncology History  Intrahepatic cholangiocarcinoma ([name])  08/18/2023 Initial Diagnosis   Cholangiocarcinoma metastatic to liver ([name])   08/26/2023 Cancer Staging   Staging form: Intrahepatic Bile Duct, AJCC 8th Edition - Clinical: Stage II (cT2, cN0, cM0) - Signed by Autumn Millman, [name] on 09/15/2023   09/28/2023 -  Chemotherapy   Patient is on Treatment Plan : BILIARY TRACT Cisplatin  + Gemcitabine  D1,8 q21d         REVIEW OF SYSTEMS:   Review of Systems - Oncology  All other pertinent systems were reviewed with the patient and are negative.  ALLERGIES: He is allergic to tape, asacol  [mesalamine ], azathioprine, gluten meal, metoprolol  tartrate, mycophenolate mofetil, other, wheat, abilify  [aripiprazole ], and amoxicillin .  MEDICATIONS:  Current Outpatient Medications  Medication Sig Dispense Refill   Cholecalciferol 50 MCG (2000 UT) CAPS Take 1 capsule by mouth daily.     dexamethasone  (DECADRON ) 4 MG tablet Take 2 tablets (8 mg) by mouth daily x 3 days starting the day after cisplatin  chemotherapy. Take with food. 30 tablet 1   ferrous sulfate  325 (65 FE) MG tablet Take 325 mg by mouth daily with breakfast. Continues to take per low HGB     hydrOXYzine  (ATARAX ) 25 MG tablet Take 1 tablet (25 mg total) by mouth 3 (three) times daily as needed for anxiety. 30 tablet 0   lidocaine -prilocaine  (EMLA ) cream Apply to affected area once 30 g 3   Magnesium   125 MG CAPS One at bedtime     mirtazapine  (REMERON ) 30 MG tablet Take 0.5 tablets (15 mg total) by mouth at bedtime.     Nutritional Supplements (ENSURE HIGH PROTEIN) LIQD Take 237 mLs by mouth in the morning and at bedtime.     ondansetron  (ZOFRAN ) 8 MG tablet Take 1 tablet (8 mg total) by mouth every 8 (eight) hours as needed for nausea or vomiting. Start on the third day after cisplatin . 30  tablet 1   Potassium 99 MG TABS Take 1 tablet by mouth daily.     predniSONE  (DELTASONE ) 20 MG tablet Take 1 tablet (20 mg total) by mouth daily with breakfast. 90 tablet 1   prochlorperazine  (COMPAZINE ) 10 MG tablet Take 1 tablet (10 mg total) by mouth every 6 (six) hours as needed (Nausea or vomiting). 30 tablet 1   vancomycin  (VANCOCIN ) 125 MG capsule Take 1 capsule (125 mg total) by mouth 4 (four) times daily for 14 days. 56 capsule 0   Vedolizumab  (ENTYVIO  IV) Inject into the vein every 6 (six) weeks.     venlafaxine  XR (EFFEXOR -XR) 150 MG 24 hr capsule Take 1 capsule (150 mg total) by mouth daily with breakfast. 30 capsule 0   venlafaxine  XR (EFFEXOR -XR) 75 MG 24 hr capsule Take 75 mg by mouth daily.     No current facility-administered medications for this visit.   Facility-Administered Medications Ordered in Other Visits  Medication Dose Route Frequency Provider Last Rate Last Admin   0.9 %  sodium chloride  infusion   Intravenous Continuous Mysty Kielty, [name]   Stopped at 11/23/23 1639     VITALS:   Blood pressure 124/81, pulse 88, temperature 98.2 F (36.8 C), temperature source Temporal, resp. rate 17, height 6' 1.5 (1.867 m), weight 172 lb (78 kg), SpO2 98%.  Wt Readings from Last 3 Encounters:  11/23/23 172 lb (78 kg)  10/28/23 165 lb (74.8 kg)  10/19/23 165 lb (74.8 kg)    Body mass index is 22.38 kg/m.    Onc Performance Status - 11/23/23 0926       ECOG Perf Status   ECOG Perf Status Fully active, able to carry on all pre-disease performance without restriction      KPS SCALE   KPS % SCORE Normal, no compliants, no evidence of disease            PHYSICAL EXAM:   Physical Exam Constitutional:      General: He is not in acute distress.    Appearance: Normal appearance.  HENT:     Head: Normocephalic and atraumatic.  Eyes:     Conjunctiva/sclera: Conjunctivae normal.  Cardiovascular:     Rate and Rhythm: Normal rate and regular rhythm.  Pulmonary:      Effort: Pulmonary effort is normal. No respiratory distress.  Chest:     Comments: Port-A-Cath in place without any signs of infection Abdominal:     General: There is no distension.  Neurological:     General: No focal deficit present.     Mental Status: He is alert and oriented to person, place, and time.  Psychiatric:        Mood and Affect: Mood normal.        Behavior: Behavior normal.       LABORATORY DATA:   I have reviewed the data as listed.  Results for orders placed or performed in visit on 11/23/23  CEA (Access)-[facility] ONLY  Result Value Ref Range   CEA [facility]) 114.55 (  H) 0.00 - 5.00 ng/mL  Magnesium   Result Value Ref Range   Magnesium  2.1 1.7 - 2.4 mg/dL  CMP (Cancer Center only)  Result Value Ref Range   Sodium 138 135 - 145 mmol/L   Potassium 4.2 3.5 - 5.1 mmol/L   Chloride 103 98 - 111 mmol/L   CO2 31 22 - 32 mmol/L   Glucose, Bld 88 70 - 99 mg/dL   BUN 29 (H) 6 - 20 mg/dL   Creatinine 8.83 9.38 - 1.24 mg/dL   Calcium 9.0 8.9 - 89.6 mg/dL   Total Protein 6.3 (L) 6.5 - 8.1 g/dL   Albumin 3.7 3.5 - 5.0 g/dL   AST 39 15 - 41 U/L   ALT 63 (H) 0 - 44 U/L   Alkaline Phosphatase 121 38 - 126 U/L   Total Bilirubin 0.4 0.0 - 1.2 mg/dL   GFR, Estimated >39 >39 mL/min   Anion gap 4 (L) 5 - 15  CBC with Differential (Cancer Center Only)  Result Value Ref Range   WBC Count 20.6 (H) 4.0 - 10.5 K/uL   RBC 4.73 4.22 - 5.81 MIL/uL   Hemoglobin 14.3 13.0 - 17.0 g/dL   HCT 57.3 60.9 - 47.9 %   MCV 90.1 80.0 - 100.0 fL   MCH 30.2 26.0 - 34.0 pg   MCHC 33.6 30.0 - 36.0 g/dL   RDW 83.1 (H) 88.4 - 84.4 %   Platelet Count 168 150 - 400 K/uL   nRBC 0.0 0.0 - 0.2 %   Neutrophils Relative % 85 %   Neutro Abs 17.7 (H) 1.7 - 7.7 K/uL   Lymphocytes Relative 4 %   Lymphs Abs 0.8 0.7 - 4.0 K/uL   Monocytes Relative 7 %   Monocytes Absolute 1.4 (H) 0.1 - 1.0 K/uL   Eosinophils Relative 0 %   Eosinophils Absolute 0.0 0.0 - 0.5 K/uL   Basophils Relative 1 %   Basophils  Absolute 0.1 0.0 - 0.1 K/uL   Immature Granulocytes 3 %   Abs Immature Granulocytes 0.52 (H) 0.00 - 0.07 K/uL       RADIOGRAPHIC STUDIES:  No recent pertinent imaging available to review.  CODE STATUS:  Code Status History     Date Active Date Inactive Code Status Order ID Comments User Context   09/14/2023 1143 09/15/2023 0511 Full Code [id number]  Johann Sieving, [name] HOV   08/20/2023 1401 08/23/2023 1406 Full Code [id number]  Lou Claretta HERO, [name] ED   07/29/2023 1419 07/30/2023 0518 Full Code [id number]  Johann Sieving, [name] HOV   07/02/2023 1533 07/05/2023 2312 Full Code [id number]  Celinda Alm Lot, [name] ED   04/13/2023 1949 05/20/2023 1942 Full Code [id number]  Tony Cathaleen LABOR, PMHNP Inpatient   03/28/2023 2234 04/04/2023 1348 Full Code [id number]  Alan Thurman GAILS, NP Inpatient   03/28/2023 1136 03/28/2023 2111 Full Code [id number]  Minnie Tinnie BRAVO, PA ED   08/26/2018 1234 09/01/2018 2105 Full Code [id number]  Arloa Chroman, PA-C ED   08/25/2018 1240 08/25/2018 2000 Full Code [id number]  Little, Vernell Search, [name] ED   07/31/2018 1406 08/01/2018 1446 Full Code [id number]  Zada Elouise BROCKS, PA-C ED   07/19/2018 2045 07/21/2018 1739 DNR [id number]  [place], Eric J, DO Inpatient   05/28/2018 1506 06/01/2018 1222 DNR [id number]  Barbarann Nest, [name] Inpatient   05/20/2012 1059 05/25/2012 1606 DNR [id number]  Cherisse Ronal ORN, NP Inpatient   05/18/2012 1723 05/20/2012 1059 Full Code [id number]  Juvenal Harlene PENNER, DO Inpatient  Questions for Most Recent Historical Code Status (Order [id number])     Question Answer   By: Other            No orders of the defined types were placed in this encounter.    Future Appointments  Date Time Provider Department Center  11/30/2023  8:15 AM [facility] FLUSH [facility] None  11/30/2023  8:45 AM Deantae Shackleton, [name] [facility] None  11/30/2023  9:30 AM [facility] INFUSION [facility] None  12/02/2023  1:30 PM [facility] FLUSH [facility] None  12/14/2023  8:15 AM [facility] FLUSH [facility] None  12/14/2023  8:45 AM Jove Beyl, [name] [facility] None  12/14/2023  9:30 AM [facility] INFUSION [facility] None      This document was completed utilizing speech recognition software. Grammatical errors, random word insertions, pronoun errors, and incomplete sentences are an occasional consequence of this system due to software limitations, ambient noise, and hardware issues. Any formal questions or concerns about the content, text or information contained within the body of this dictation should be directly addressed to the provider for clarification.

## 2023-11-24 ENCOUNTER — Encounter: Payer: Self-pay | Admitting: Oncology

## 2023-11-24 DIAGNOSIS — C221 Intrahepatic bile duct carcinoma: Secondary | ICD-10-CM

## 2023-11-24 LAB — CANCER ANTIGEN 19-9: CA 19-9: 4431 U/mL — ABNORMAL HIGH (ref 0–35)

## 2023-11-24 NOTE — Progress Notes (Signed)
 PATIENT NAVIGATOR PROGRESS NOTE  Name: [name] Date: 11/24/2023 MRN: [id number]  DOB: December 10, 1965   Per Dr. Clovis request, surgical referral has been entered for [facility]/Dr. Leonor Dawn.    Time spent counseling/coordinating care: 30-45 minutes

## 2023-11-28 ENCOUNTER — Encounter: Payer: Self-pay | Admitting: Oncology

## 2023-11-28 DIAGNOSIS — K5090 Crohn's disease, unspecified, without complications: Secondary | ICD-10-CM | POA: Diagnosis not present

## 2023-11-29 ENCOUNTER — Other Ambulatory Visit: Payer: Self-pay | Admitting: Oncology

## 2023-11-29 DIAGNOSIS — C221 Intrahepatic bile duct carcinoma: Secondary | ICD-10-CM

## 2023-11-29 NOTE — Progress Notes (Signed)
 Request placed for MRI of the liver with and without contrast for further evaluation of liver lesions that were noted on PET scan and also to assess treatment response prior to surgical consultation with Dr. Aron.

## 2023-11-30 ENCOUNTER — Inpatient Hospital Stay

## 2023-11-30 ENCOUNTER — Encounter: Payer: Self-pay | Admitting: Oncology

## 2023-11-30 ENCOUNTER — Inpatient Hospital Stay: Admitting: Oncology

## 2023-11-30 ENCOUNTER — Inpatient Hospital Stay: Attending: Oncology

## 2023-11-30 VITALS — BP 126/84 | HR 80 | Temp 97.6°F | Resp 16 | Ht 73.5 in | Wt 172.0 lb

## 2023-11-30 DIAGNOSIS — Z79899 Other long term (current) drug therapy: Secondary | ICD-10-CM | POA: Diagnosis not present

## 2023-11-30 DIAGNOSIS — Z5111 Encounter for antineoplastic chemotherapy: Secondary | ICD-10-CM | POA: Insufficient documentation

## 2023-11-30 DIAGNOSIS — C221 Intrahepatic bile duct carcinoma: Secondary | ICD-10-CM

## 2023-11-30 LAB — CBC WITH DIFFERENTIAL (CANCER CENTER ONLY)
Abs Immature Granulocytes: 0.12 K/uL — ABNORMAL HIGH (ref 0.00–0.07)
Basophils Absolute: 0 K/uL (ref 0.0–0.1)
Basophils Relative: 0 %
Eosinophils Absolute: 0 K/uL (ref 0.0–0.5)
Eosinophils Relative: 0 %
HCT: 39.9 % (ref 39.0–52.0)
Hemoglobin: 13.5 g/dL (ref 13.0–17.0)
Immature Granulocytes: 1 %
Lymphocytes Relative: 6 %
Lymphs Abs: 0.6 K/uL — ABNORMAL LOW (ref 0.7–4.0)
MCH: 30.1 pg (ref 26.0–34.0)
MCHC: 33.8 g/dL (ref 30.0–36.0)
MCV: 88.9 fL (ref 80.0–100.0)
Monocytes Absolute: 0.8 K/uL (ref 0.1–1.0)
Monocytes Relative: 8 %
Neutro Abs: 8.1 K/uL — ABNORMAL HIGH (ref 1.7–7.7)
Neutrophils Relative %: 85 %
Platelet Count: 100 K/uL — ABNORMAL LOW (ref 150–400)
RBC: 4.49 MIL/uL (ref 4.22–5.81)
RDW: 16.2 % — ABNORMAL HIGH (ref 11.5–15.5)
WBC Count: 9.6 K/uL (ref 4.0–10.5)
nRBC: 0 % (ref 0.0–0.2)

## 2023-11-30 LAB — CMP (CANCER CENTER ONLY)
ALT: 96 U/L — ABNORMAL HIGH (ref 0–44)
AST: 47 U/L — ABNORMAL HIGH (ref 15–41)
Albumin: 3.6 g/dL (ref 3.5–5.0)
Alkaline Phosphatase: 135 U/L — ABNORMAL HIGH (ref 38–126)
Anion gap: 5 (ref 5–15)
BUN: 25 mg/dL — ABNORMAL HIGH (ref 6–20)
CO2: 30 mmol/L (ref 22–32)
Calcium: 8.9 mg/dL (ref 8.9–10.3)
Chloride: 103 mmol/L (ref 98–111)
Creatinine: 1.08 mg/dL (ref 0.61–1.24)
GFR, Estimated: >60 mL/min (ref >60)
Glucose, Bld: 86 mg/dL (ref 70–99)
Potassium: 4 mmol/L (ref 3.5–5.1)
Sodium: 138 mmol/L (ref 135–145)
Total Bilirubin: 0.3 mg/dL (ref 0.0–1.2)
Total Protein: 6.1 g/dL — ABNORMAL LOW (ref 6.5–8.1)

## 2023-11-30 LAB — MAGNESIUM: Magnesium: 2 mg/dL (ref 1.7–2.4)

## 2023-11-30 MED ORDER — SODIUM CHLORIDE 0.9% FLUSH: 10.0000 mL | INTRAVENOUS | Status: DC | PRN

## 2023-11-30 MED ORDER — PALONOSETRON HCL INJECTION 0.25 MG/5ML
0.2500 mg | Freq: Once | INTRAVENOUS | Status: AC
  Administered 2023-11-30: 0.25 mg via INTRAVENOUS
  Filled 2023-11-30: qty 5

## 2023-11-30 MED ORDER — DEXAMETHASONE SODIUM PHOSPHATE 10 MG/ML IJ SOLN
10.0000 mg | Freq: Once | INTRAMUSCULAR | Status: AC
  Administered 2023-11-30: 10 mg via INTRAVENOUS
  Filled 2023-11-30: qty 1

## 2023-11-30 MED ORDER — MAGNESIUM SULFATE 2 GM/50ML IV SOLN
2.0000 g | Freq: Once | INTRAVENOUS | Status: AC
  Administered 2023-11-30: 2 g via INTRAVENOUS
  Filled 2023-11-30: qty 50

## 2023-11-30 MED ORDER — SODIUM CHLORIDE 0.9 % IV SOLN
1000.0000 mg/m2 | Freq: Once | INTRAVENOUS | Status: AC
  Administered 2023-11-30: 1938 mg via INTRAVENOUS
  Filled 2023-11-30: qty 50.97

## 2023-11-30 MED ORDER — SODIUM CHLORIDE 0.9 % IV SOLN: INTRAVENOUS | Status: DC

## 2023-11-30 MED ORDER — SODIUM CHLORIDE 0.9 % IV SOLN
25.0000 mg/m2 | Freq: Once | INTRAVENOUS | Status: AC
  Administered 2023-11-30: 50 mg via INTRAVENOUS
  Filled 2023-11-30: qty 50

## 2023-11-30 MED ORDER — APREPITANT 130 MG/18ML IV EMUL
130.0000 mg | Freq: Once | INTRAVENOUS | Status: AC
  Administered 2023-11-30: 130 mg via INTRAVENOUS
  Filled 2023-11-30: qty 18

## 2023-11-30 MED ORDER — POTASSIUM CHLORIDE IN NACL 20-0.9 MEQ/L-% IV SOLN
Freq: Once | INTRAVENOUS | Status: AC
  Filled 2023-11-30: qty 1000

## 2023-11-30 NOTE — Progress Notes (Signed)
 [facility]  ONCOLOGY CLINIC PROGRESS NOTE   Patient Care Team: [name], MD as PCP - General (Family Medicine) Avram Lupita BRAVO, MD as Attending Physician (Gastroenterology) Harvey Carlin BRAVO, MD (Inactive) as Attending Physician (Vascular Surgery) Ardis Evalene CROME, RN as Oncology Nurse Navigator Namish Krise, Chinita, MD as Consulting Physician (Hematology and Oncology)  PATIENT NAME: [name]   MR#: [id number] DOB: [birth year]  Date of visit: 11/30/2023   ASSESSMENT & PLAN:   [name] is a 58 y.o.  gentleman with a past medical history of primary sclerosing cholangitis, autoimmune hepatitis, Crohn's disease, anxiety/depression, PTSD, past C. difficile colitis, chronic pain syndrome, chronic mesenteric ischemia, basal cell carcinoma of left lower extremity, history of suicide attempt, past portal vein thrombosis, was referred to our clinic in June 2025 for newly diagnosed intrahepatic cholangiocarcinoma  Intrahepatic cholangiocarcinoma ([name]) Intrahepatic cholangiocarcinoma, diagnosed following MRI and liver biopsy in May 2025.  He underwent ultrasound-guided biopsy of liver lesion on 07/29/2023.  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 CDX-2 positive, CK20 positive, CK7 positive, synaptophysin negative, chromogranin negative.  IHC profile suggested a gastrointestinal or pancreaticobiliary primary.  To look for a primary, patient underwent upper endoscopy and colonoscopy on 08/18/2023, by Dr. Avram.  EGD showed evidence of gastritis, single gastric polyp.  Otherwise unremarkable.  Colonoscopy showed several polyps in the transverse colon, sigmoid colon, descending colon.  Evidence of diverticulosis in the sigmoid colon.  Multiple biopsies were obtained.  Biopsies from the stomach, colon, cecum all showed benign findings.  No evidence of malignancy noted.  Clinical picture  is consistent with intrahepatic cholangiocarcinoma, primarily located in the right lobe of the liver with disease limited to the liver area. A new small lesion on the dome of the left side of the liver noted on PET scan and also recent CT scan.  Too small to be biopsied.  Will have to monitor this on future scans.   His case was previously discussed in GI multidisciplinary tumor conference.  Dr. Dasie felt that he can be a surgical candidate following neoadjuvant chemotherapy.  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  Given history of autoimmune hepatitis, primary sclerosing cholangitis, he would not be a candidate for immunotherapy.  Hence plan would be to proceed with chemotherapy using cisplatin  and gemcitabine .  Request previously submitted for NGS testing on the specimen and liquid biopsy.  Following drainage of pelvic/anal abscess, once we have obtained clearance from the surgery department, and once his C. difficile was cleared, he was started on systemic chemotherapy with cisplatin  and gemcitabine  from 09/28/2023.  Plan is to continue once weekly for two weeks on, one week off, depending on tolerance.   He is tolerating treatment well with no nausea, vomiting, fevers, chills, night sweats, or diarrhea. He is eating well and reports no pain. He has noticed subjective improvement. Overall no dose-limiting toxicities.    Due for cycle 3-day 8 of chemotherapy today.  Labs reveal thrombocytopenia with platelet count of 100,000.  White count 9600 with normal differential.  Hemoglobin stable at 13.5.  LFTs stable overall.  Will proceed with chemotherapy as scheduled.  Because of previous leukocytosis, we will skip G-CSF with current cycle.  He is scheduled for MRI of the liver on 12/07/2023, followed by surgical consultation with Dr. Aron on 12/16/2023.  - Monitor kidney function and ensure  adequate hydration to prevent  kidney dysfunction.  - Monitor blood counts, liver function, and for side effects such as nausea, fatigue, hearing loss, and neuropathy.  - Monitor CA 19-9 and CEA levels periodically.   RTC in 2 weeks for cycle 4-day 1 of chemotherapy.   History of Clostridioides difficile infection Clostridioides difficile infection has resolved.    I reviewed lab results and outside records for this visit and discussed relevant results with the patient. Diagnosis, plan of care and treatment options were also discussed in detail with the patient. Opportunity provided to ask questions and answers provided to his apparent satisfaction. Provided instructions to call our clinic with any problems, questions or concerns prior to return visit. I recommended to continue follow-up with PCP and sub-specialists. He verbalized understanding and agreed with the plan.   NCCN guidelines have been consulted in the planning of this patient's care.  I spent a total of 30 minutes during this encounter with the patient including review of chart and various tests results, discussions about plan of care and coordination of care plan.   Chinita Patten, MD  11/30/2023 10:22 AM  [facility] - [facility] [address] [place] [address] Dept: [phone] Dept Fax: [phone]    CHIEF COMPLAINT/ REASON FOR VISIT:   Intrahepatic cholangiocarcinoma, diagnosed in June 2025.  Current Treatment: Plan for systemic treatment with cisplatin  and gemcitabine , followed by surgical evaluation.  Started on systemic chemotherapy with cisplatin  and gemcitabine  from 09/28/2023.  INTERVAL HISTORY:    Discussed the use of AI scribe software for clinical note transcription with the patient, who gave verbal consent to proceed.  History of Present Illness  [name] is a 58 year old male undergoing treatment  for cancer who presents for follow-up and treatment evaluation.  He is currently undergoing cancer treatment and has no issues with diarrhea. His platelet count has decreased slightly. White blood cell count and hemoglobin levels are normal. Liver function tests showed improvement last week.  He is scheduled for an MRI next week, with a follow-up appointment with Dr. Aron the following week. Tumor markers have been discussed, but the focus remains on the upcoming scan results.  No diarrhea and no other acute symptoms.   I have reviewed the past medical history, past surgical history, social history and family history with the patient and they are unchanged from previous note.  HISTORY OF PRESENT ILLNESS:   ONCOLOGY HISTORY:   He has a history of autoimmune hepatitis, primary sclerosing cholangitis diagnosed in 2012, and Crohn's disease for which he is currently taking prednisone  and Entyvio .   He was admitted to the hospital on 07/02/2023 after he presented with complaints of multiple episodes of diarrhea, dizziness, near syncope.  CT scan of the abdomen and pelvis on 07/02/2023 showed new 3.7 x 2.8 cm noncystic lesion in the dome of the liver, which was not present on prior studies.  Given history of primary sclerosing cholangitis, there was concern for cholangiocarcinoma and MRI was recommended.   On 07/03/2023, MRI of the abdomen showed 4.3 x 2.8 x 3.7 cm lesion in the lateral aspect of the hepatic dome which showed heterogeneous enhancement after IV contrast administration, suspicious for neoplasm.  Cholangiocarcinoma was considered because of his history of primary sclerosing cholangitis.  Inferior and posterior to this lesion was a cyst measuring 5 x 4.5 cm, slightly increased in size compared to previous MRI.  Bile duct showed  variable dilatation and stenosis.   Patient did not want to stay in the hospital for biopsy at that time.   He underwent ultrasound-guided biopsy of liver lesion on  07/29/2023.  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 CDX-2 positive, CK20 positive, CK7 positive, synaptophysin negative, chromogranin negative.  IHC profile suggested a gastrointestinal or pancreaticobiliary primary.   To look for a primary, patient underwent upper endoscopy and colonoscopy on 08/18/2023, by Dr. Avram.  EGD showed evidence of gastritis, single gastric polyp.  Otherwise unremarkable.  Colonoscopy showed several polyps in the transverse colon, sigmoid colon, descending colon.  Evidence of diverticulosis in the sigmoid colon.  Multiple biopsies were obtained.  Biopsies from the stomach, colon, cecum all showed benign findings.  No evidence of malignancy noted.   Clinical picture is consistent with intrahepatic cholangiocarcinoma.   Given history of autoimmune hepatitis, primary sclerosing cholangitis, he would not be a candidate for immunotherapy.  Hence plan would be to proceed with chemotherapy using cisplatin  and gemcitabine .   Request submitted for NGS testing on the specimen and liquid biopsy.  Liquid biopsy showed no actionable mutations.   On 09/09/2023, staging PET scan showed marginal areas of uptake along the mass lesion in the posterior superior right hepatic lobe, corresponding to the area of known neoplasm.  There were small cystic areas elsewhere in the liver, new small low-density focus in the dome of left hepatic lobe.  No appreciable abnormal uptake.  No areas of abnormal uptake seen above the diaphragm.  Findings consistent with anal fistula noted.  His case was previously discussed in GI multidisciplinary tumor conference.  Dr. Dasie felt that he can be a surgical candidate following neoadjuvant chemotherapy.  Following drainage of pelvic/anal abscess, once we have obtained clearance from the surgery department, and once his C. difficile was cleared, he was started on systemic chemotherapy with  cisplatin  and gemcitabine  from 09/28/2023.  Plan is to continue once weekly for two weeks on, one week off, depending on tolerance.   He is scheduled for MRI of the liver on 12/07/2023, followed by surgical consultation with Dr. Aron on 12/16/2023.  Oncology History  Intrahepatic cholangiocarcinoma ([name])  08/18/2023 Initial Diagnosis   Cholangiocarcinoma metastatic to liver ([name])   08/26/2023 Cancer Staging   Staging form: Intrahepatic Bile Duct, AJCC 8th Edition - Clinical: Stage II (cT2, cN0, cM0) - Signed by Autumn Millman, MD on 09/15/2023   09/28/2023 -  Chemotherapy   Patient is on Treatment Plan : BILIARY TRACT Cisplatin  + Gemcitabine  D1,8 q21d         REVIEW OF SYSTEMS:   Review of Systems - Oncology  All other pertinent systems were reviewed with the patient and are negative.  ALLERGIES: He is allergic to tape, asacol  [mesalamine ], azathioprine, gluten meal, metoprolol  tartrate, mycophenolate mofetil, other, wheat, abilify  [aripiprazole ], and amoxicillin .  MEDICATIONS:  Current Outpatient Medications  Medication Sig Dispense Refill   Cholecalciferol 50 MCG (2000 UT) CAPS Take 1 capsule by mouth daily.     dexamethasone  (DECADRON ) 4 MG tablet Take 2 tablets (8 mg) by mouth daily x 3 days starting the day after cisplatin  chemotherapy. Take with food. 30 tablet 1   ferrous sulfate  325 (65 FE) MG tablet Take 325 mg by mouth daily with breakfast. Continues to take per low HGB     hydrOXYzine  (ATARAX ) 25 MG tablet Take 1 tablet (25 mg total) by mouth 3 (three) times daily as needed for anxiety. 30 tablet 0  lidocaine -prilocaine  (EMLA ) cream Apply to affected area once 30 g 3   Magnesium  125 MG CAPS One at bedtime     mirtazapine  (REMERON ) 30 MG tablet Take 0.5 tablets (15 mg total) by mouth at bedtime.     Nutritional Supplements (ENSURE HIGH PROTEIN) LIQD Take 237 mLs by mouth in the morning and at bedtime.     ondansetron  (ZOFRAN ) 8 MG tablet Take 1 tablet (8 mg total) by  mouth every 8 (eight) hours as needed for nausea or vomiting. Start on the third day after cisplatin . 30 tablet 1   Potassium 99 MG TABS Take 1 tablet by mouth daily.     predniSONE  (DELTASONE ) 20 MG tablet Take 1 tablet (20 mg total) by mouth daily with breakfast. 90 tablet 1   prochlorperazine  (COMPAZINE ) 10 MG tablet Take 1 tablet (10 mg total) by mouth every 6 (six) hours as needed (Nausea or vomiting). 30 tablet 1   Vedolizumab  (ENTYVIO  IV) Inject into the vein every 6 (six) weeks.     venlafaxine  XR (EFFEXOR -XR) 150 MG 24 hr capsule Take 1 capsule (150 mg total) by mouth daily with breakfast. 30 capsule 0   venlafaxine  XR (EFFEXOR -XR) 75 MG 24 hr capsule Take 75 mg by mouth daily.     No current facility-administered medications for this visit.   Facility-Administered Medications Ordered in Other Visits  Medication Dose Route Frequency Provider Last Rate Last Admin   0.9 %  sodium chloride  infusion   Intravenous Continuous Yitta Gongaware, MD 10 mL/hr at 11/30/23 0927 New Bag at 11/30/23 9072   aprepitant  (CINVANTI ) injection 130 mg  130 mg Intravenous Once Caio Devera, MD       CISplatin  (PLATINOL ) 50 mg in sodium chloride  0.9 % 250 mL chemo infusion  25 mg/m2 (Treatment Plan Recorded) Intravenous Once Zainah Steven, MD       dexamethasone  (DECADRON ) injection 10 mg  10 mg Intravenous Once Garmon Dehn, MD       gemcitabine  (GEMZAR ) 1,938 mg in sodium chloride  0.9 % 250 mL chemo infusion  1,000 mg/m2 (Treatment Plan Recorded) Intravenous Once Yakov Bergen, MD       magnesium  sulfate IVPB 2 g 50 mL  2 g Intravenous Once Anderson Coppock, MD 50 mL/hr at 11/30/23 0938 2 g at 11/30/23 0938   palonosetron  (ALOXI ) injection 0.25 mg  0.25 mg Intravenous Once Jenavieve Freda, MD       sodium chloride  flush (NS) 0.9 % injection 10 mL  10 mL Intracatheter PRN Claxton Levitz, MD         VITALS:   Blood pressure 126/84, pulse 80, temperature 97.6 F (36.4 C), temperature source Temporal,  resp. rate 16, height 6' 1.5 (1.867 m), weight 172 lb (78 kg), SpO2 100%.  Wt Readings from Last 3 Encounters:  11/30/23 172 lb (78 kg)  11/23/23 172 lb (78 kg)  10/28/23 165 lb (74.8 kg)    Body mass index is 22.38 kg/m.    Onc Performance Status - 11/30/23 0844       ECOG Perf Status   ECOG Perf Status Fully active, able to carry on all pre-disease performance without restriction      KPS SCALE   KPS % SCORE Normal, no compliants, no evidence of disease            PHYSICAL EXAM:   Physical Exam Constitutional:      General: He is not in acute distress.    Appearance: Normal appearance.  HENT:  Head: Normocephalic and atraumatic.  Eyes:     Conjunctiva/sclera: Conjunctivae normal.  Cardiovascular:     Rate and Rhythm: Normal rate and regular rhythm.  Pulmonary:     Effort: Pulmonary effort is normal. No respiratory distress.  Chest:     Comments: Port-A-Cath in place without any signs of infection Abdominal:     General: There is no distension.  Neurological:     General: No focal deficit present.     Mental Status: He is alert and oriented to person, place, and time.  Psychiatric:        Mood and Affect: Mood normal.        Behavior: Behavior normal.       LABORATORY DATA:   I have reviewed the data as listed.  Results for orders placed or performed in visit on 11/30/23  Magnesium   Result Value Ref Range   Magnesium  2.0 1.7 - 2.4 mg/dL  CMP (Cancer Center only)  Result Value Ref Range   Sodium 138 135 - 145 mmol/L   Potassium 4.0 3.5 - 5.1 mmol/L   Chloride 103 98 - 111 mmol/L   CO2 30 22 - 32 mmol/L   Glucose, Bld 86 70 - 99 mg/dL   BUN 25 (H) 6 - 20 mg/dL   Creatinine 8.91 9.38 - 1.24 mg/dL   Calcium 8.9 8.9 - 89.6 mg/dL   Total Protein 6.1 (L) 6.5 - 8.1 g/dL   Albumin 3.6 3.5 - 5.0 g/dL   AST 47 (H) 15 - 41 U/L   ALT 96 (H) 0 - 44 U/L   Alkaline Phosphatase 135 (H) 38 - 126 U/L   Total Bilirubin 0.3 0.0 - 1.2 mg/dL   GFR, Estimated  >39 >39 mL/min   Anion gap 5 5 - 15  CBC with Differential (Cancer Center Only)  Result Value Ref Range   WBC Count 9.6 4.0 - 10.5 K/uL   RBC 4.49 4.22 - 5.81 MIL/uL   Hemoglobin 13.5 13.0 - 17.0 g/dL   HCT 60.0 60.9 - 47.9 %   MCV 88.9 80.0 - 100.0 fL   MCH 30.1 26.0 - 34.0 pg   MCHC 33.8 30.0 - 36.0 g/dL   RDW 83.7 (H) 88.4 - 84.4 %   Platelet Count 100 (L) 150 - 400 K/uL   nRBC 0.0 0.0 - 0.2 %   Neutrophils Relative % 85 %   Neutro Abs 8.1 (H) 1.7 - 7.7 K/uL   Lymphocytes Relative 6 %   Lymphs Abs 0.6 (L) 0.7 - 4.0 K/uL   Monocytes Relative 8 %   Monocytes Absolute 0.8 0.1 - 1.0 K/uL   Eosinophils Relative 0 %   Eosinophils Absolute 0.0 0.0 - 0.5 K/uL   Basophils Relative 0 %   Basophils Absolute 0.0 0.0 - 0.1 K/uL   Immature Granulocytes 1 %   Abs Immature Granulocytes 0.12 (H) 0.00 - 0.07 K/uL       RADIOGRAPHIC STUDIES:  No recent pertinent imaging available to review.  CODE STATUS:  Code Status History     Date Active Date Inactive Code Status Order ID Comments User Context   09/14/2023 1143 09/15/2023 0511 Full Code [id number]  Johann Sieving, MD HOV   08/20/2023 1401 08/23/2023 1406 Full Code [id number]  Lou Claretta HERO, MD ED   07/29/2023 1419 07/30/2023 0518 Full Code [id number]  Johann Sieving, MD HOV   07/02/2023 1533 07/05/2023 2312 Full Code [id number]  Celinda Alm Lot, MD ED   04/13/2023 1949 05/20/2023 1942  Full Code [id number]  Tony Cathaleen LABOR, PMHNP Inpatient   03/28/2023 2234 04/04/2023 1348 Full Code [id number]  Alan Thurman GAILS, NP Inpatient   03/28/2023 1136 03/28/2023 2111 Full Code [id number]  Minnie Tinnie BRAVO, PA ED   08/26/2018 1234 09/01/2018 2105 Full Code [id number]  Arloa Chroman, PA-C ED   08/25/2018 1240 08/25/2018 2000 Full Code [id number]  Little, Vernell Search, MD ED   07/31/2018 1406 08/01/2018 1446 Full Code [id number]  Zada Elouise BROCKS, PA-C ED   07/19/2018 2045 07/21/2018 1739 DNR [id number]  [place], Eric J, DO Inpatient   05/28/2018 1506 06/01/2018  1222 DNR [id number]  Barbarann Nest, MD Inpatient   05/20/2012 1059 05/25/2012 1606 DNR [id number]  Cherisse Ronal ORN, NP Inpatient   05/18/2012 1723 05/20/2012 1059 Full Code [id number]  Juvenal Harlene PENNER, DO Inpatient    Questions for Most Recent Historical Code Status (Order [id number])     Question Answer   By: Other            No orders of the defined types were placed in this encounter.    Future Appointments  Date Time Provider Department Center  12/07/2023  8:30 AM [facility] 1 [facility] [place]  12/14/2023  8:15 AM [facility] FLUSH [facility] None  12/14/2023  8:45 AM Long Brimage, MD [facility] None  12/14/2023  9:30 AM [facility] INFUSION [facility] None      This document was completed utilizing speech recognition software. Grammatical errors, random word insertions, pronoun errors, and incomplete sentences are an occasional consequence of this system due to software limitations, ambient noise, and hardware issues. Any formal questions or concerns about the content, text or information contained within the body of this dictation should be directly addressed to the provider for clarification.

## 2023-11-30 NOTE — Assessment & Plan Note (Signed)
 Intrahepatic cholangiocarcinoma, diagnosed following MRI and liver biopsy in May 2025.  He underwent ultrasound-guided biopsy of liver lesion on 07/29/2023.  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 CDX-2 positive, CK20 positive, CK7 positive, synaptophysin negative, chromogranin negative.  IHC profile suggested a gastrointestinal or pancreaticobiliary primary.  To look for a primary, patient underwent upper endoscopy and colonoscopy on 08/18/2023, by Dr. Avram.  EGD showed evidence of gastritis, single gastric polyp.  Otherwise unremarkable.  Colonoscopy showed several polyps in the transverse colon, sigmoid colon, descending colon.  Evidence of diverticulosis in the sigmoid colon.  Multiple biopsies were obtained.  Biopsies from the stomach, colon, cecum all showed benign findings.  No evidence of malignancy noted.  Clinical picture is consistent with intrahepatic cholangiocarcinoma, primarily located in the right lobe of the liver with disease limited to the liver area. A new small lesion on the dome of the left side of the liver noted on PET scan and also recent CT scan.  Too small to be biopsied.  Will have to monitor this on future scans.   His case was previously discussed in GI multidisciplinary tumor conference.  Dr. Dasie felt that he can be a surgical candidate following neoadjuvant chemotherapy.  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  Given history of autoimmune hepatitis, primary sclerosing cholangitis, he would not be a candidate for immunotherapy.  Hence plan would be to proceed with chemotherapy using cisplatin  and gemcitabine .  Request previously submitted for NGS testing on the specimen and liquid biopsy.  Following drainage of pelvic/anal abscess, once we have obtained clearance from the surgery  department, and once his C. difficile was cleared, he was started on systemic chemotherapy with cisplatin  and gemcitabine  from 09/28/2023.  Plan is to continue once weekly for two weeks on, one week off, depending on tolerance.   He is tolerating treatment well with no nausea, vomiting, fevers, chills, night sweats, or diarrhea. He is eating well and reports no pain. He has noticed subjective improvement. Overall no dose-limiting toxicities.    Due for cycle 3-day 8 of chemotherapy today.  Labs reveal thrombocytopenia with platelet count of 100,000.  White count 9600 with normal differential.  Hemoglobin stable at 13.5.  LFTs stable overall.  Will proceed with chemotherapy as scheduled.  Because of previous leukocytosis, we will skip G-CSF with current cycle.  He is scheduled for MRI of the liver on 12/07/2023, followed by surgical consultation with Dr. Aron on 12/16/2023.  - Monitor kidney function and ensure adequate hydration to prevent kidney dysfunction.  - Monitor blood counts, liver function, and for side effects such as nausea, fatigue, hearing loss, and neuropathy.  - Monitor CA 19-9 and CEA levels periodically.   RTC in 2 weeks for cycle 4-day 1 of chemotherapy.

## 2023-11-30 NOTE — Patient Instructions (Signed)
 [facility] - A DEPT OF [facility]  Discharge Instructions: Thank you for choosing [facility] to provide your oncology and hematology care.   If you have a lab appointment with the Cancer Center, please go directly to the Cancer Center and check in at the registration area.   Wear comfortable clothing and clothing appropriate for easy access to any Portacath or PICC line.   We strive to give you quality time with your provider. You may need to reschedule your appointment if you arrive late (15 or more minutes).  Arriving late affects you and other patients whose appointments are after yours.  Also, if you miss three or more appointments without notifying the office, you may be dismissed from the clinic at the provider's discretion.      For prescription refill requests, have your pharmacy contact our office and allow 72 hours for refills to be completed.    Today you received the following chemotherapy and/or immunotherapy agents gemzar  and cisplatin       To help prevent nausea and vomiting after your treatment, we encourage you to take your nausea medication as directed.  BELOW ARE SYMPTOMS THAT SHOULD BE REPORTED IMMEDIATELY: *FEVER GREATER THAN 100.4 F (38 C) OR HIGHER *CHILLS OR SWEATING *NAUSEA AND VOMITING THAT IS NOT CONTROLLED WITH YOUR NAUSEA MEDICATION *UNUSUAL SHORTNESS OF BREATH *UNUSUAL BRUISING OR BLEEDING *URINARY PROBLEMS (pain or burning when urinating, or frequent urination) *BOWEL PROBLEMS (unusual diarrhea, constipation, pain near the anus) TENDERNESS IN MOUTH AND THROAT WITH OR WITHOUT PRESENCE OF ULCERS (sore throat, sores in mouth, or a toothache) UNUSUAL RASH, SWELLING OR PAIN  UNUSUAL VAGINAL DISCHARGE OR ITCHING   Items with * indicate a potential emergency and should be followed up as soon as possible or go to the Emergency Department if any problems should occur.  Please show the CHEMOTHERAPY ALERT CARD or  IMMUNOTHERAPY ALERT CARD at check-in to the Emergency Department and triage nurse.  Should you have questions after your visit or need to cancel or reschedule your appointment, please contact [facility] - A DEPT OF [facility]  Dept: [phone]  and follow the prompts.  Office hours are 8:00 a.m. to 4:30 p.m. Monday - Friday. Please note that voicemails left after 4:00 p.m. may not be returned until the following business day.  We are closed weekends and major holidays. You have access to a nurse at all times for urgent questions. Please call the main number to the clinic Dept: [phone] and follow the prompts.   For any non-urgent questions, you may also contact your provider using MyChart. We now offer e-Visits for anyone 17 and older to request care online for non-urgent symptoms. For details visit [email or web address].   Also download the MyChart app! Go to the app store, search "MyChart", open the app, select [place], and log in with your MyChart username and password.

## 2023-12-02 ENCOUNTER — Other Ambulatory Visit: Payer: Self-pay

## 2023-12-02 ENCOUNTER — Inpatient Hospital Stay

## 2023-12-04 ENCOUNTER — Encounter (HOSPITAL_COMMUNITY): Payer: Self-pay

## 2023-12-06 DIAGNOSIS — F330 Major depressive disorder, recurrent, mild: Secondary | ICD-10-CM | POA: Diagnosis not present

## 2023-12-06 DIAGNOSIS — K50919 Crohn's disease, unspecified, with unspecified complications: Secondary | ICD-10-CM | POA: Diagnosis not present

## 2023-12-06 DIAGNOSIS — F411 Generalized anxiety disorder: Secondary | ICD-10-CM | POA: Diagnosis not present

## 2023-12-06 DIAGNOSIS — K610 Anal abscess: Secondary | ICD-10-CM | POA: Diagnosis not present

## 2023-12-06 NOTE — Progress Notes (Signed)
 REFERRING PHYSICIAN:  Self  PROVIDER:  LONNI OZELL PIZZA, MD  MRN: [id number] DOB: 14-Feb-1966 DATE OF ENCOUNTER: 12/06/2023  Subjective   Chief Complaint: Perirectal abscess, recurrent   History of Present Illness: [name] is a 58 y.o. male with history of primary sclerosing cholangitis, autoimmune hepatitis, Crohn's disease, anxiety/depression, PTSD, past C. difficile colitis, chronic pain syndrome, chronic mesenteric ischemia, basal cell carcinoma of left lower extremity, history of suicide attempt, past portal vein thrombosis, intrahepatic cholangiocarcinoma (following with medical oncology Dr. Autumn whom is seen in the office today for evaluation of perianal Crohn's + recurrent abscess.  He follows with Dr. Debby in our practice but sees me in the urgent office today.  He does have a longstanding history of Crohn's disease and has undergone prior EUA's with seton placements back in 11/19/2015 and 01/19/2019 for anal fistulas.  Ultimately, he opted to have these removed as they were quite uncomfortable.  He has had numerous recurrent abscesses in the perianal area.  He is currently undergoing systemic treatment for cholangiocarcinoma on cisplatin  and gemcitabine  with his last infusion being on Wednesday 1 week ago.  He is also on Entyvio  for Crohn's disease.  He reports that he has developed 5-hour history now of perianal pain identical to prior abscesses in the identical location where he is had prior abscesses drained.  He last had an abscess drained in our office in June of this year by [facility]  This is at the exact same spot.  He denies any fever or chills.  He wanted to come in when it was still early.  He has active drainage from his known fistula on both the right and left side of the anal area.  This recurrent abscess that he has felt today is in the left posterior position.   PMH: primary sclerosing cholangitis, autoimmune hepatitis, Crohn's disease,  anxiety/depression, PTSD, past C. difficile colitis, chronic pain syndrome, chronic mesenteric ischemia, basal cell carcinoma of left lower extremity, history of suicide attempt, past portal vein thrombosis, intrahepatic cholangiocarcinoma (following with medical oncology Dr. Autumn  PSH: as above  Social Hx: He is here today by himself  Review of Systems: A complete review of systems was obtained from the patient.  I have reviewed this information and discussed as appropriate with the patient.  See HPI as well for other ROS.  All of the below systems have been reviewed with the patient and positives are indicated with bold text General: chills, fever or night sweats Eyes: blurry vision or double vision ENT: epistaxis or sore throat Allergy/Immunology: itchy/watery eyes or nasal congestion Hematologic/Lymphatic: bleeding problems, blood clots or swollen lymph nodes Endocrine: temperature intolerance or unexpected weight changes Breast: new or changing breast lumps or nipple discharge Resp: cough, shortness of breath, or wheezing CV: chest pain or dyspnea on exertion GI: as per HPI GU: dysuria, trouble voiding, or hematuria MSK: joint pain or joint stiffness Neuro: TIA or stroke symptoms Derm: pruritus and skin lesion changes Psych: anxiety and depression   Medical History: Past Medical History:  Diagnosis Date  . Anemia   . Anxiety   . Arthritis     There is no problem list on file for this patient.   No past surgical history on file.   No Known Allergies  Current Outpatient Medications on File Prior to Visit  Medication Sig Dispense Refill  . dexAMETHasone  (DECADRON ) 4 MG tablet Take 2 tablets (8 mg) by mouth daily x 3 days starting the day after cisplatin   chemotherapy. Take with food.    . hydrOXYzine  (ATARAX ) 25 MG tablet Take 25 mg by mouth 3 (three) times daily as needed    . lidocaine -prilocaine  (EMLA ) cream Apply to affected area once    . mirtazapine  (REMERON ) 30  MG tablet Take 15 mg by mouth    . potassium gluconate 2.5 mEq Tab Take 1 tablet by mouth once daily    . predniSONE  (DELTASONE ) 20 MG tablet Take 30 mg by mouth    . prochlorperazine  (COMPAZINE ) 10 MG tablet Take 10 mg by mouth    . venlafaxine  (EFFEXOR -XR) 150 MG XR capsule Take 150 mg by mouth    . busPIRone  (BUSPAR ) 7.5 MG tablet     . DIFICID  200 mg tablet     . doxycycline  (VIBRA -TABS) 100 MG tablet Take 100 mg by mouth every 12 (twelve) hours    . LORazepam  (ATIVAN ) 1 MG tablet Take 1 mg by mouth    . neomycin -bacitracnZn-polymyxnB 3.5-500-10,000 mg-unit-unit Oint Apply 1 Application topically     No current facility-administered medications on file prior to visit.    Family History  Problem Relation Age of Onset  . Breast cancer Mother      Social History   Tobacco Use  Smoking Status Former  . Types: Cigarettes  Smokeless Tobacco Never     Social History   Socioeconomic History  . Marital status: Unknown  Tobacco Use  . Smoking status: Former    Types: Cigarettes  . Smokeless tobacco: Never  Substance and Sexual Activity  . Alcohol use: Not Currently  . Drug use: Never   Social Drivers of Corporate investment banker Strain: Low Risk  (08/28/2019)   Received from [facility]   Overall Financial Resource Strain (CARDIA)   . Difficulty of Paying Living Expenses: Not hard at all  Food Insecurity: No Food Insecurity (08/26/2023)   Received from [facility]   Hunger Vital Sign   . Within the past 12 months, you worried that your food would run out before you got the money to buy more.: Never true   . Within the past 12 months, the food you bought just didn't last and you didn't have money to get more.: Never true  Transportation Needs: No Transportation Needs (08/26/2023)   Received from [facility] - Transportation   . In the past 12 months, has lack of transportation kept you from medical appointments or from getting medications?: No   . In the past  12 months, has lack of transportation kept you from meetings, work, or from getting things needed for daily living?: No  Physical Activity: Insufficiently Active (11/08/2019)   Received from [facility]   Exercise Vital Sign   . On average, how many days per week do you engage in moderate to strenuous exercise (like a brisk walk)?: 3 days   . On average, how many minutes do you engage in exercise at this level?: 30 min  Stress: Stress Concern Present (08/28/2019)   Received from [facility] - Occupational Stress Questionnaire   . Feeling of Stress : Very much  Social Connections: Socially Isolated (07/02/2023)   Received from [facility]   Social Connection and Isolation Panel   . In a typical week, how many times do you talk on the phone with family, friends, or neighbors?: Once a week   . How often do you get together with friends or relatives?: Once a week   .  How often do you attend church or religious services?: Never   . Do you belong to any clubs or organizations such as church groups, unions, fraternal or athletic groups, or school groups?: No   . How often do you attend meetings of the clubs or organizations you belong to?: Never   . Are you married, widowed, divorced, separated, never married, or living with a partner?: Never married  Housing Stability: High Risk (04/13/2023)   Received from [facility] Stability Vital Sign   . Unable to Pay for Housing in the Last Year: No   . Number of Times Moved in the Last Year: 2   . Homeless in the Last Year: No    Objective:    Vitals:   12/06/23 1108  PainSc: 0-No pain    There is no height or weight on file to calculate BMI.  Constitutional: NAD; conversant Eyes: Moist conjunctiva; anicteric Lungs: Normal respiratory effort CV: RRR; no pitting edema Anorectal: Multiple spontaneously draining fistula externally in the anal margin-1 with drainage in the left lateral position.   Additional in the right posterior/posterior midline.  There is an area of thin skin and fluctuance that I suspect actually communicates with his left lateral fistula.  There is some erythema around this, ~quarter sized.  This is where his discomfort is.  DRE-normal tone.  No palpable fluctuance within the anal canal. Psychiatric: Appropriate affect  A chaperone, Chemira Jones CMA, was present for this encounter and examination  Assessment and Plan:  Diagnoses and all orders for this visit:  Perianal abscess  Crohn's disease with complication, unspecified gastrointestinal tract location (CMS/HHS-[name])     [name] is a very pleasant 58 y.o. male with hx of primary sclerosing cholangitis, autoimmune hepatitis, Crohn's disease, anxiety/depression, PTSD, past C. difficile colitis, chronic pain syndrome, chronic mesenteric ischemia, basal cell carcinoma of left lower extremity, history of suicide attempt, past portal vein thrombosis, intrahepatic cholangiocarcinoma (following with medical oncology Dr. Autumn) whom is seen in the office today for evaluation of perianal Crohn's + recurrent abscess here for evaluation of left posterior perirectal abscess  - We discussed the anatomy and physiology of the anorectal region and pathophysiology of perirectal abscess - We have reviewed options going forward including further observation vs office based incision/drainage - recommending drainage procedure - The planned procedure, material risks (including, but not limited to, pain, bleeding, infection, scarring, rare cases of pelvic sepsis) benefits and alternatives were discussed. - The patient's questions were answered to his satisfaction, he voiced understanding and elected to proceed with incision and drainage of perirectal abscess.  Procedure: Incision and drainage of perirectal abscess Diagnosis: Perirectal abscess EBL: 2 mL Narrative: Witnessed informed consent was obtained.  A timeout was held  confirming patient, procedure, and plan.  Perianal skin is anesthetized at the area of maximal fluctuance in the left posterior position using 1% lidocaine  with epinephrine .  A cruciate type incision was created the area of maximal fluctuance and 1 to 2 cc of purulent fluid is expressed.  Hemostasis is achieved.  The corners of the wound were trimmed to facilitate ongoing drainage.  No packing was felt to be necessary.  He tolerated the procedure well.  A dressing consisting of 4 x 4's and tape was placed.  - Will prescribe course of Cipro /Flagyl  for 7 days given that he is on immunosuppressants with active chemotherapy.  If he were to develop any worsening of symptoms, fever/chills, or any other concerns, he has been instructed  to go to the emergency room for further evaluation. - Will have him follow-up in our urgent office with one of our surgeons in approximately 2 days for recheck as well.   Return in about 2 days (around 12/08/2023).  I spent a total of 40 minutes today in both face-to-face and non-face-to-face activities to perform the following: review records, take history, perform exam, counsel the patient on the diagnosis, and document encounter, findings, and plan in the EHR  for this visit on the date of this encounter.  Lonni Pizza, MD [facility], A [facility]

## 2023-12-07 ENCOUNTER — Ambulatory Visit (HOSPITAL_COMMUNITY)
Admission: RE | Admit: 2023-12-07 | Discharge: 2023-12-07 | Disposition: A | Discharge status: Home / Self Care | Source: Ambulatory Visit | Attending: Oncology | Admitting: Oncology

## 2023-12-07 ENCOUNTER — Encounter: Payer: Self-pay | Admitting: Oncology

## 2023-12-07 DIAGNOSIS — C221 Intrahepatic bile duct carcinoma: Secondary | ICD-10-CM | POA: Diagnosis not present

## 2023-12-07 DIAGNOSIS — K769 Liver disease, unspecified: Secondary | ICD-10-CM | POA: Diagnosis not present

## 2023-12-07 DIAGNOSIS — K7689 Other specified diseases of liver: Secondary | ICD-10-CM | POA: Diagnosis not present

## 2023-12-07 MED ORDER — GADOBUTROL 1 MMOL/ML IV SOLN
7.5000 mL | Freq: Once | INTRAVENOUS | Status: AC | PRN
Start: 2023-12-07 — End: 2023-12-07
  Administered 2023-12-07: 7.5 mL via INTRAVENOUS

## 2023-12-13 DIAGNOSIS — M9903 Segmental and somatic dysfunction of lumbar region: Secondary | ICD-10-CM | POA: Diagnosis not present

## 2023-12-14 ENCOUNTER — Inpatient Hospital Stay (HOSPITAL_BASED_OUTPATIENT_CLINIC_OR_DEPARTMENT_OTHER): Admitting: Oncology

## 2023-12-14 ENCOUNTER — Encounter: Payer: Self-pay | Admitting: Oncology

## 2023-12-14 ENCOUNTER — Inpatient Hospital Stay

## 2023-12-14 VITALS — BP 117/71 | HR 84 | Temp 97.4°F | Resp 18 | Ht 73.5 in | Wt 173.0 lb

## 2023-12-14 DIAGNOSIS — C221 Intrahepatic bile duct carcinoma: Secondary | ICD-10-CM

## 2023-12-14 DIAGNOSIS — Z79899 Other long term (current) drug therapy: Secondary | ICD-10-CM | POA: Diagnosis not present

## 2023-12-14 DIAGNOSIS — Z5111 Encounter for antineoplastic chemotherapy: Secondary | ICD-10-CM | POA: Diagnosis not present

## 2023-12-14 LAB — CBC WITH DIFFERENTIAL (CANCER CENTER ONLY)
Abs Immature Granulocytes: 0.52 K/uL — ABNORMAL HIGH (ref 0.00–0.07)
Basophils Absolute: 0 K/uL (ref 0.0–0.1)
Basophils Relative: 0 %
Eosinophils Absolute: 0 K/uL (ref 0.0–0.5)
Eosinophils Relative: 1 %
HCT: 39.3 % (ref 39.0–52.0)
Hemoglobin: 13.1 g/dL (ref 13.0–17.0)
Immature Granulocytes: 6 %
Lymphocytes Relative: 16 %
Lymphs Abs: 1.3 K/uL (ref 0.7–4.0)
MCH: 30 pg (ref 26.0–34.0)
MCHC: 33.3 g/dL (ref 30.0–36.0)
MCV: 89.9 fL (ref 80.0–100.0)
Monocytes Absolute: 0.9 K/uL (ref 0.1–1.0)
Monocytes Relative: 11 %
Neutro Abs: 5.3 K/uL (ref 1.7–7.7)
Neutrophils Relative %: 66 %
Platelet Count: 168 K/uL (ref 150–400)
RBC: 4.37 MIL/uL (ref 4.22–5.81)
RDW: 17 % — ABNORMAL HIGH (ref 11.5–15.5)
Smear Review: NORMAL
WBC Count: 8.2 K/uL (ref 4.0–10.5)
nRBC: 0 % (ref 0.0–0.2)

## 2023-12-14 LAB — CMP (CANCER CENTER ONLY)
ALT: 43 U/L (ref 0–44)
AST: 30 U/L (ref 15–41)
Albumin: 3.3 g/dL — ABNORMAL LOW (ref 3.5–5.0)
Alkaline Phosphatase: 113 U/L (ref 38–126)
Anion gap: 3 — ABNORMAL LOW (ref 5–15)
BUN: 22 mg/dL — ABNORMAL HIGH (ref 6–20)
CO2: 31 mmol/L (ref 22–32)
Calcium: 9 mg/dL (ref 8.9–10.3)
Chloride: 105 mmol/L (ref 98–111)
Creatinine: 0.98 mg/dL (ref 0.61–1.24)
GFR, Estimated: >60 mL/min (ref >60)
Glucose, Bld: 123 mg/dL — ABNORMAL HIGH (ref 70–99)
Potassium: 3.7 mmol/L (ref 3.5–5.1)
Sodium: 139 mmol/L (ref 135–145)
Total Bilirubin: 0.3 mg/dL (ref 0.0–1.2)
Total Protein: 5.6 g/dL — ABNORMAL LOW (ref 6.5–8.1)

## 2023-12-14 LAB — MAGNESIUM: Magnesium: 2 mg/dL (ref 1.7–2.4)

## 2023-12-14 MED ORDER — DEXAMETHASONE 4 MG PO TABS: 8.0000 mg | ORAL_TABLET | Freq: Every day | ORAL | 1 refills | Status: DC

## 2023-12-14 MED ORDER — DEXAMETHASONE SOD PHOSPHATE PF 10 MG/ML IJ SOLN
10.0000 mg | Freq: Once | INTRAMUSCULAR | Status: AC
  Administered 2023-12-14: 10 mg via INTRAVENOUS

## 2023-12-14 MED ORDER — ONDANSETRON HCL 8 MG PO TABS: 8.0000 mg | ORAL_TABLET | Freq: Three times a day (TID) | ORAL | 1 refills | Status: DC | PRN

## 2023-12-14 MED ORDER — PROCHLORPERAZINE MALEATE 10 MG PO TABS
10.0000 mg | ORAL_TABLET | Freq: Four times a day (QID) | ORAL | 1 refills | Status: AC | PRN
Start: 2023-12-14 — End: ?

## 2023-12-14 MED ORDER — OXALIPLATIN CHEMO INJECTION 100 MG/20ML
85.0000 mg/m2 | Freq: Once | INTRAVENOUS | Status: AC
  Administered 2023-12-14: 170 mg via INTRAVENOUS
  Filled 2023-12-14: qty 34

## 2023-12-14 MED ORDER — PALONOSETRON HCL INJECTION 0.25 MG/5ML
0.2500 mg | Freq: Once | INTRAVENOUS | Status: AC
  Administered 2023-12-14: 0.25 mg via INTRAVENOUS
  Filled 2023-12-14: qty 5

## 2023-12-14 MED ORDER — LIDOCAINE-PRILOCAINE 2.5-2.5 % EX CREA: TOPICAL_CREAM | CUTANEOUS | 3 refills | Status: AC

## 2023-12-14 MED ORDER — LEUCOVORIN CALCIUM INJECTION 350 MG
400.0000 mg/m2 | Freq: Once | INTRAVENOUS | Status: AC
  Administered 2023-12-14: 808 mg via INTRAVENOUS
  Filled 2023-12-14: qty 40.4

## 2023-12-14 MED ORDER — FLUOROURACIL CHEMO INJECTION 2.5 GM/50ML
400.0000 mg/m2 | Freq: Once | INTRAVENOUS | Status: AC
  Administered 2023-12-14: 800 mg via INTRAVENOUS
  Filled 2023-12-14: qty 16

## 2023-12-14 MED ORDER — DEXTROSE 5 % IV SOLN: INTRAVENOUS | Status: DC

## 2023-12-14 MED ORDER — SODIUM CHLORIDE 0.9 % IV SOLN
2400.0000 mg/m2 | INTRAVENOUS | Status: DC
  Administered 2023-12-14: 5000 mg via INTRAVENOUS
  Filled 2023-12-14: qty 100

## 2023-12-14 NOTE — Progress Notes (Signed)
 [facility]  ONCOLOGY CLINIC PROGRESS NOTE   Patient Care Team: [name], MD as PCP - General (Family Medicine) Avram Lupita BRAVO, MD as Attending Physician (Gastroenterology) Harvey Carlin BRAVO, MD (Inactive) as Attending Physician (Vascular Surgery) Ardis Evalene CROME, RN as Oncology Nurse Navigator Marquail Bradwell, Chinita, MD as Consulting Physician (Hematology and Oncology)  PATIENT NAME: [name]   MR#: [id number] DOB: 07/06/1965  Date of visit: 12/14/2023   ASSESSMENT & PLAN:   [name] is a 58 y.o.  gentleman with a past medical history of primary sclerosing cholangitis, autoimmune hepatitis, Crohn's disease, anxiety/depression, PTSD, past C. difficile colitis, chronic pain syndrome, chronic mesenteric ischemia, basal cell carcinoma of left lower extremity, history of suicide attempt, past portal vein thrombosis, was referred to our clinic in June 2025 for newly diagnosed intrahepatic cholangiocarcinoma.  cT2, cN0, cM1.  Intrahepatic cholangiocarcinoma ([name]) Intrahepatic cholangiocarcinoma, diagnosed following MRI and liver biopsy in May 2025.  He underwent ultrasound-guided biopsy of liver lesion on 07/29/2023.  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 CDX-2 positive, CK20 positive, CK7 positive, synaptophysin negative, chromogranin negative.  IHC profile suggested a gastrointestinal or pancreaticobiliary primary.  To look for a primary, patient underwent upper endoscopy and colonoscopy on 08/18/2023, by Dr. Avram.  EGD showed evidence of gastritis, single gastric polyp.  Otherwise unremarkable.  Colonoscopy showed several polyps in the transverse colon, sigmoid colon, descending colon.  Evidence of diverticulosis in the sigmoid colon.  Multiple biopsies were obtained.  Biopsies from the stomach, colon, cecum all showed benign findings.  No evidence of malignancy  noted.  Clinical picture is consistent with intrahepatic cholangiocarcinoma, primarily located in the right lobe of the liver with disease limited to the liver area. A new small lesion on the dome of the left side of the liver noted on PET scan and also recent CT scan.  Too small to be biopsied.  Will have to monitor this on future scans.   His case was previously discussed in GI multidisciplinary tumor conference.  Dr. Dasie felt that he can be a surgical candidate following neoadjuvant chemotherapy.  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  Given history of autoimmune hepatitis, primary sclerosing cholangitis, he would not be a candidate for immunotherapy.  Hence plan would be to proceed with chemotherapy using cisplatin  and gemcitabine .  Request previously submitted for NGS testing on the specimen and liquid biopsy.  Following drainage of pelvic/anal abscess, once we have obtained clearance from the surgery department, and once his C. difficile was cleared, he was started on systemic chemotherapy with cisplatin  and gemcitabine  from 09/28/2023.  Plan was to continue once weekly for two weeks on, one week off, depending on tolerance.   Following completion of 3 cycles of chemotherapy, restaging MRI of the abdomen on 12/07/2023 showed progressive disease with diffuse mets in the liver.  Discussed results with the patient in detail today.  Clinically he seemed to have improved compared to prior.  This was an unfortunate development.  Reviewed NCCN guidelines and discussed treatment options.  Proposed switching treatments to FOLFOX.  Discussed side effect profile including diarrhea, hand-foot syndrome with 5-FU and cold sensitivity, progressive neuropathy with oxaliplatin in addition to other side effects.  Patient verbalized understanding and was willing to proceed with this treatment  option.  Proceeded with cycle 1 of FOLFOX from today (12/14/2023).  Plan is to continue  this every 2 weeks with restaging imaging in approximately 3 months from now.  - Monitor blood counts, liver function, and for side effects such as nausea, fatigue, hand-foot syndrome, diarrhea and neuropathy. - Advise on precautions with cold sensitivity: eat and drink at room temperature, wear gloves when handling cold items. - Prescribe Decadron  for two days post-chemotherapy to prevent nausea.  - Monitor CA 19-9 and CEA levels periodically.   RTC in 1 week for toxicity check.  History of Clostridioides difficile infection Clostridioides difficile infection has resolved.   I reviewed lab results and outside records for this visit and discussed relevant results with the patient. Diagnosis, plan of care and treatment options were also discussed in detail with the patient. Opportunity provided to ask questions and answers provided to his apparent satisfaction. Provided instructions to call our clinic with any problems, questions or concerns prior to return visit. I recommended to continue follow-up with PCP and sub-specialists. He verbalized understanding and agreed with the plan.   NCCN guidelines have been consulted in the planning of this patient's care.  I spent a total of 42 minutes during this encounter with the patient including review of chart and various tests results, discussions about plan of care and coordination of care plan.   Chinita Patten, MD  12/14/2023 4:37 PM  [facility] - A DEPT OF [facility] [address] [place] [address] Dept: [phone] Dept Fax: [phone]    CHIEF COMPLAINT/ REASON FOR VISIT:   Intrahepatic cholangiocarcinoma, diagnosed in June 2025.  Current Treatment: Plan made for systemic treatment with cisplatin  and gemcitabine , followed by surgical evaluation.  Started on systemic  chemotherapy with cisplatin  and gemcitabine  from 09/28/2023.  Immunotherapy avoided because of his history of autoimmune hepatitis and primary sclerosing cholangitis.  Following completion of 3 cycles of chemotherapy, restaging MRI of the abdomen on 12/07/2023 showed progressive disease with diffuse mets in the liver.  Treatment switched to FOLFOX from 12/14/2023.  INTERVAL HISTORY:    Discussed the use of AI scribe software for clinical note transcription with the patient, who gave verbal consent to proceed.  History of Present Illness  [name] is a 58 year old male with gallbladder and biliary cancer who presents with disease progression on MRI.  He has been undergoing treatment for gallbladder and biliary cancer. Recent MRI findings showed an increase in size of existing lesions and the appearance of multiple new lesions, the largest measuring 2.4 cm.  He has a history of primary sclerosing cholangitis and autoimmune hepatitis, which has influenced treatment decisions, particularly the avoidance of immunotherapy due to potential exacerbation of these conditions.  Recent blood work shows improvement in liver function tests, with alkaline phosphatase, ALT, and AST levels returning to normal or near-normal ranges.  No current pain and maintains a stable weight. No nausea, and he has been eating well. He is currently on a chemotherapy regimen.   I have reviewed the past medical history, past surgical history, social history and family history with the patient and they are unchanged from previous note.  HISTORY OF PRESENT ILLNESS:   ONCOLOGY HISTORY:   He has a history of autoimmune hepatitis, primary sclerosing cholangitis diagnosed in 2012, and Crohn's disease for which he is currently taking prednisone  and Entyvio .   He was admitted to the hospital on 07/02/2023 after he presented with complaints of multiple episodes of diarrhea, dizziness, near syncope.  CT scan of  the abdomen and pelvis on 07/02/2023 showed new 3.7 x 2.8 cm noncystic lesion in the dome of the liver, which was not present on prior studies.  Given history of primary sclerosing cholangitis, there was concern for cholangiocarcinoma and MRI was recommended.   On 07/03/2023, MRI of the abdomen showed 4.3 x 2.8 x 3.7 cm lesion in the lateral aspect of the hepatic dome which showed heterogeneous enhancement after IV contrast administration, suspicious for neoplasm.  Cholangiocarcinoma was considered because of his history of primary sclerosing cholangitis.  Inferior and posterior to this lesion was a cyst measuring 5 x 4.5 cm, slightly increased in size compared to previous MRI.  Bile duct showed variable dilatation and stenosis.   Patient did not want to stay in the hospital for biopsy at that time.   He underwent ultrasound-guided biopsy of liver lesion on 07/29/2023.  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 CDX-2 positive, CK20 positive, CK7 positive, synaptophysin negative, chromogranin negative.  IHC profile suggested a gastrointestinal or pancreaticobiliary primary.   To look for a primary, patient underwent upper endoscopy and colonoscopy on 08/18/2023, by Dr. Avram.  EGD showed evidence of gastritis, single gastric polyp.  Otherwise unremarkable.  Colonoscopy showed several polyps in the transverse colon, sigmoid colon, descending colon.  Evidence of diverticulosis in the sigmoid colon.  Multiple biopsies were obtained.  Biopsies from the stomach, colon, cecum all showed benign findings.  No evidence of malignancy noted.   Clinical picture is consistent with intrahepatic cholangiocarcinoma.   Given history of autoimmune hepatitis, primary sclerosing cholangitis, he would not be a candidate for immunotherapy.  Hence plan would be to proceed with chemotherapy using cisplatin  and gemcitabine .   Request submitted for NGS testing  on the specimen and liquid biopsy.  Liquid biopsy showed no actionable mutations.   On 09/09/2023, staging PET scan showed marginal areas of uptake along the mass lesion in the posterior superior right hepatic lobe, corresponding to the area of known neoplasm.  There were small cystic areas elsewhere in the liver, new small low-density focus in the dome of left hepatic lobe.  No appreciable abnormal uptake.  No areas of abnormal uptake seen above the diaphragm.  Findings consistent with anal fistula noted.  His case was previously discussed in GI multidisciplinary tumor conference.  Dr. Dasie felt that he can be a surgical candidate following neoadjuvant chemotherapy.  Following drainage of pelvic/anal abscess, once we have obtained clearance from the surgery department, and once his C. difficile was cleared, he was started on systemic chemotherapy with cisplatin  and gemcitabine  from 09/28/2023.  Plan made to continue once weekly for two weeks on, one week off, depending on tolerance.   Following completion of 3 cycles of chemotherapy, restaging MRI of the abdomen on 12/07/2023 showed progressive disease with diffuse mets in the liver.  Treatment switched to FOLFOX from 12/14/2023.   Oncology History  Intrahepatic cholangiocarcinoma ([name])  08/18/2023 Initial Diagnosis   Cholangiocarcinoma metastatic to liver ([name])   08/26/2023 Cancer Staging   Staging form: Intrahepatic Bile Duct, AJCC 8th Edition - Clinical: Stage IV (cT2, cN0, cM1) - Signed by Autumn Millman, MD on 12/14/2023   09/28/2023 - 11/30/2023 Chemotherapy   Patient is on Treatment Plan : BILIARY TRACT Cisplatin  + Gemcitabine  D1,8 q21d     12/14/2023 -  Chemotherapy   Patient is on Treatment Plan : FOLFOX q14d         REVIEW OF SYSTEMS:   Review of  Systems - Oncology  All other pertinent systems were reviewed with the patient and are negative.  ALLERGIES: He is allergic to tape, asacol  [mesalamine ], azathioprine, gluten meal,  metoprolol  tartrate, mycophenolate mofetil, other, wheat, abilify  [aripiprazole ], and amoxicillin .  MEDICATIONS:  Current Outpatient Medications  Medication Sig Dispense Refill   Cholecalciferol 50 MCG (2000 UT) CAPS Take 1 capsule by mouth daily.     ferrous sulfate  325 (65 FE) MG tablet Take 325 mg by mouth daily with breakfast. Continues to take per low HGB     hydrOXYzine  (ATARAX ) 25 MG tablet Take 1 tablet (25 mg total) by mouth 3 (three) times daily as needed for anxiety. 30 tablet 0   Magnesium  125 MG CAPS One at bedtime     mirtazapine  (REMERON ) 30 MG tablet Take 0.5 tablets (15 mg total) by mouth at bedtime.     Nutritional Supplements (ENSURE HIGH PROTEIN) LIQD Take 237 mLs by mouth in the morning and at bedtime.     Potassium 99 MG TABS Take 1 tablet by mouth daily.     predniSONE  (DELTASONE ) 20 MG tablet Take 1 tablet (20 mg total) by mouth daily with breakfast. 90 tablet 1   Vedolizumab  (ENTYVIO  IV) Inject into the vein every 6 (six) weeks.     venlafaxine  XR (EFFEXOR -XR) 150 MG 24 hr capsule Take 1 capsule (150 mg total) by mouth daily with breakfast. 30 capsule 0   venlafaxine  XR (EFFEXOR -XR) 75 MG 24 hr capsule Take 75 mg by mouth daily.     dexamethasone  (DECADRON ) 4 MG tablet Take 2 tablets (8 mg total) by mouth daily. Start the day after chemotherapy for 2 days. Take with food. 30 tablet 1   lidocaine -prilocaine  (EMLA ) cream Apply to affected area once 30 g 3   ondansetron  (ZOFRAN ) 8 MG tablet Take 1 tablet (8 mg total) by mouth every 8 (eight) hours as needed for nausea or vomiting. Start on the third day after chemotherapy. 30 tablet 1   prochlorperazine  (COMPAZINE ) 10 MG tablet Take 1 tablet (10 mg total) by mouth every 6 (six) hours as needed for nausea or vomiting. 30 tablet 1   No current facility-administered medications for this visit.   Facility-Administered Medications Ordered in Other Visits  Medication Dose Route Frequency Provider Last Rate Last Admin    dextrose  5 % solution   Intravenous Continuous Saraann Enneking, MD   Stopped at 12/14/23 1253   fluorouracil (ADRUCIL) 5,000 mg in sodium chloride  0.9 % 150 mL chemo infusion  2,400 mg/m2 (Treatment Plan Recorded) Intravenous 1 day or 1 dose Jamen Loiseau, MD   Infusion Verify at 12/14/23 1329     VITALS:   Blood pressure 117/71, pulse 84, temperature (!) 97.4 F (36.3 C), temperature source Tympanic, resp. rate 18, height 6' 1.5 (1.867 m), weight 173 lb (78.5 kg), SpO2 99%.  Wt Readings from Last 3 Encounters:  12/14/23 173 lb (78.5 kg)  11/30/23 172 lb (78 kg)  11/23/23 172 lb (78 kg)    Body mass index is 22.52 kg/m.    Onc Performance Status - 12/14/23 0850       ECOG Perf Status   ECOG Perf Status Fully active, able to carry on all pre-disease performance without restriction      KPS SCALE   KPS % SCORE Normal, no compliants, no evidence of disease            PHYSICAL EXAM:   Physical Exam Constitutional:      General: He is not  in acute distress.    Appearance: Normal appearance.  HENT:     Head: Normocephalic and atraumatic.  Eyes:     Conjunctiva/sclera: Conjunctivae normal.  Cardiovascular:     Rate and Rhythm: Normal rate and regular rhythm.  Pulmonary:     Effort: Pulmonary effort is normal. No respiratory distress.  Chest:     Comments: Port-A-Cath in place without any signs of infection Abdominal:     General: There is no distension.  Neurological:     General: No focal deficit present.     Mental Status: He is alert and oriented to person, place, and time.  Psychiatric:        Mood and Affect: Mood normal.        Behavior: Behavior normal.       LABORATORY DATA:   I have reviewed the data as listed.  Results for orders placed or performed in visit on 12/14/23  Magnesium   Result Value Ref Range   Magnesium  2.0 1.7 - 2.4 mg/dL  CMP (Cancer Center only)  Result Value Ref Range   Sodium 139 135 - 145 mmol/L   Potassium 3.7 3.5 - 5.1  mmol/L   Chloride 105 98 - 111 mmol/L   CO2 31 22 - 32 mmol/L   Glucose, Bld 123 (H) 70 - 99 mg/dL   BUN 22 (H) 6 - 20 mg/dL   Creatinine 9.01 9.38 - 1.24 mg/dL   Calcium 9.0 8.9 - 89.6 mg/dL   Total Protein 5.6 (L) 6.5 - 8.1 g/dL   Albumin 3.3 (L) 3.5 - 5.0 g/dL   AST 30 15 - 41 U/L   ALT 43 0 - 44 U/L   Alkaline Phosphatase 113 38 - 126 U/L   Total Bilirubin 0.3 0.0 - 1.2 mg/dL   GFR, Estimated >39 >39 mL/min   Anion gap 3 (L) 5 - 15  CBC with Differential (Cancer Center Only)  Result Value Ref Range   WBC Count 8.2 4.0 - 10.5 K/uL   RBC 4.37 4.22 - 5.81 MIL/uL   Hemoglobin 13.1 13.0 - 17.0 g/dL   HCT 60.6 60.9 - 47.9 %   MCV 89.9 80.0 - 100.0 fL   MCH 30.0 26.0 - 34.0 pg   MCHC 33.3 30.0 - 36.0 g/dL   RDW 82.9 (H) 88.4 - 84.4 %   Platelet Count 168 150 - 400 K/uL   nRBC 0.0 0.0 - 0.2 %   Neutrophils Relative % 66 %   Neutro Abs 5.3 1.7 - 7.7 K/uL   Lymphocytes Relative 16 %   Lymphs Abs 1.3 0.7 - 4.0 K/uL   Monocytes Relative 11 %   Monocytes Absolute 0.9 0.1 - 1.0 K/uL   Eosinophils Relative 1 %   Eosinophils Absolute 0.0 0.0 - 0.5 K/uL   Basophils Relative 0 %   Basophils Absolute 0.0 0.0 - 0.1 K/uL   WBC Morphology See Note    RBC Morphology MORPHOLOGY UNREMARKABLE    Smear Review Normal platelet morphology    Immature Granulocytes 6 %   Abs Immature Granulocytes 0.52 (H) 0.00 - 0.07 K/uL       RADIOGRAPHIC STUDIES:  No recent pertinent imaging available to review.  CODE STATUS:  Code Status History     Date Active Date Inactive Code Status Order ID Comments User Context   09/14/2023 1143 09/15/2023 0511 Full Code [id number]  Johann Sieving, MD HOV   08/20/2023 1401 08/23/2023 1406 Full Code [id number]  Lou Claretta HERO, MD ED  07/29/2023 1419 07/30/2023 0518 Full Code [id number]  Johann Sieving, MD HOV   07/02/2023 1533 07/05/2023 2312 Full Code [id number]  Celinda Alm Lot, MD ED   04/13/2023 1949 05/20/2023 1942 Full Code [id number]  Tony Cathaleen LABOR, PMHNP Inpatient   03/28/2023 2234 04/04/2023 1348 Full Code [id number]  Alan Thurman GAILS, NP Inpatient   03/28/2023 1136 03/28/2023 2111 Full Code [id number]  Minnie Tinnie BRAVO, PA ED   08/26/2018 1234 09/01/2018 2105 Full Code [id number]  Arloa Chroman, PA-C ED   08/25/2018 1240 08/25/2018 2000 Full Code [id number]  Little, Vernell Search, MD ED   07/31/2018 1406 08/01/2018 1446 Full Code [id number]  Zada Elouise BROCKS, PA-C ED   07/19/2018 2045 07/21/2018 1739 DNR [id number]  [place], Eric J, DO Inpatient   05/28/2018 1506 06/01/2018 1222 DNR [id number]  Barbarann Nest, MD Inpatient   05/20/2012 1059 05/25/2012 1606 DNR [id number]  Cherisse Ronal ORN, NP Inpatient   05/18/2012 1723 05/20/2012 1059 Full Code [id number]  Juvenal Harlene PENNER, DO Inpatient    Questions for Most Recent Historical Code Status (Order [id number])     Question Answer   By: Other            Orders Placed This Encounter  Procedures   Consent Attestation for Oncology Treatment    The patient is informed of risks, benefits, side-effects of the prescribed oncology treatment. Potential short term and long term side effects and response rates discussed. After a long discussion, the patient made informed decision to proceed.:   Yes   CBC with Differential (Cancer Center Only)    Standing Status:   Future    Expected Date:   12/28/2023    Expiration Date:   12/27/2024   CMP (Cancer Center only)    Standing Status:   Future    Expected Date:   12/28/2023    Expiration Date:   12/27/2024   [facility] PHYSICIAN COMMUNICATION 1    0 Number of doses of oxaliplatin received at [facility] or outside facility. AP signature of Provider. If patient has received greater than 5 doses of oxaliplatin, the following pre-medications should be ordered: dexamethasone , diphenhydramine , and formulary histamine H2 antagonist. If patient cannot tolerate oral histamine H2 antagonist, IV may be given.     Future Appointments  Date Time Provider Department Center  12/16/2023   1:30 PM [facility] None  12/21/2023  9:00 AM [facility] None  12/21/2023  9:45 AM Gladis Soley, Chinita, MD [facility] None      This document was completed utilizing speech recognition software. Grammatical errors, random word insertions, pronoun errors, and incomplete sentences are an occasional consequence of this system due to software limitations, ambient noise, and hardware issues. Any formal questions or concerns about the content, text or information contained within the body of this dictation should be directly addressed to the provider for clarification.

## 2023-12-14 NOTE — Progress Notes (Signed)
 START ON PATHWAY REGIMEN - Hepatobiliary     A cycle is every 14 days:     Oxaliplatin      Leucovorin      Fluorouracil      Fluorouracil   **Always confirm dose/schedule in your pharmacy ordering system**  Patient Characteristics: Cholangiocarcinoma, Unresectable/Metastatic, Systemic Therapy, Second Line and Beyond, No Alteration Present or Targeted Therapy Exhausted Hepatobiliary Disease Type: Cholangiocarcinoma Line of therapy: Second Line and Beyond Subsequent Therapy Considerations: No Alteration Present Intent of Therapy: Non-Curative / Palliative Intent, Discussed with Patient

## 2023-12-14 NOTE — Assessment & Plan Note (Signed)
 Intrahepatic cholangiocarcinoma, diagnosed following MRI and liver biopsy in May 2025.  He underwent ultrasound-guided biopsy of liver lesion on 07/29/2023.  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 CDX-2 positive, CK20 positive, CK7 positive, synaptophysin negative, chromogranin negative.  IHC profile suggested a gastrointestinal or pancreaticobiliary primary.  To look for a primary, patient underwent upper endoscopy and colonoscopy on 08/18/2023, by Dr. Avram.  EGD showed evidence of gastritis, single gastric polyp.  Otherwise unremarkable.  Colonoscopy showed several polyps in the transverse colon, sigmoid colon, descending colon.  Evidence of diverticulosis in the sigmoid colon.  Multiple biopsies were obtained.  Biopsies from the stomach, colon, cecum all showed benign findings.  No evidence of malignancy noted.  Clinical picture is consistent with intrahepatic cholangiocarcinoma, primarily located in the right lobe of the liver with disease limited to the liver area. A new small lesion on the dome of the left side of the liver noted on PET scan and also recent CT scan.  Too small to be biopsied.  Will have to monitor this on future scans.   His case was previously discussed in GI multidisciplinary tumor conference.  Dr. Dasie felt that he can be a surgical candidate following neoadjuvant chemotherapy.  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  Given history of autoimmune hepatitis, primary sclerosing cholangitis, he would not be a candidate for immunotherapy.  Hence plan would be to proceed with chemotherapy using cisplatin  and gemcitabine .  Request previously submitted for NGS testing on the specimen and liquid biopsy.  Following drainage of pelvic/anal abscess, once we have obtained clearance from the surgery  department, and once his C. difficile was cleared, he was started on systemic chemotherapy with cisplatin  and gemcitabine  from 09/28/2023.  Plan was to continue once weekly for two weeks on, one week off, depending on tolerance.   Following completion of 3 cycles of chemotherapy, restaging MRI of the abdomen on 12/07/2023 showed progressive disease with diffuse mets in the liver.  Discussed results with the patient in detail today.  Clinically he seemed to have improved compared to prior.  This was an unfortunate development.  Reviewed NCCN guidelines and discussed treatment options.  Proposed switching treatments to FOLFOX.  Discussed side effect profile including diarrhea, hand-foot syndrome with 5-FU and cold sensitivity, progressive neuropathy with oxaliplatin in addition to other side effects.  Patient verbalized understanding and was willing to proceed with this treatment option.  Proceeded with cycle 1 of FOLFOX from today (12/14/2023).  Plan is to continue this every 2 weeks with restaging imaging in approximately 3 months from now.  - Monitor blood counts, liver function, and for side effects such as nausea, fatigue, hand-foot syndrome, diarrhea and neuropathy. - Advise on precautions with cold sensitivity: eat and drink at room temperature, wear gloves when handling cold items. - Prescribe Decadron  for two days post-chemotherapy to prevent nausea.  - Monitor CA 19-9 and CEA levels periodically.   RTC in 1 week for toxicity check.

## 2023-12-14 NOTE — Progress Notes (Signed)
 Education provided and reviewed on Folfox, at home infusion pump and spill kit for home. Extra batteries sent home with pt for pump as well. Pt verbalized an understanding. Sent message for f/u call from nurse tomorrow.

## 2023-12-14 NOTE — Patient Instructions (Signed)
 [facility] - A DEPT OF [facility]  Discharge Instructions: Thank you for choosing [facility] to provide your oncology and hematology care.   If you have a lab appointment with the Cancer Center, please go directly to the Cancer Center and check in at the registration area.   Wear comfortable clothing and clothing appropriate for easy access to any Portacath or PICC line.   We strive to give you quality time with your provider. You may need to reschedule your appointment if you arrive late (15 or more minutes).  Arriving late affects you and other patients whose appointments are after yours.  Also, if you miss three or more appointments without notifying the office, you may be dismissed from the clinic at the provider's discretion.      For prescription refill requests, have your pharmacy contact our office and allow 72 hours for refills to be completed.    Today you received the following chemotherapy and/or immunotherapy agents: fluorouracil  (ADRUCIL ), leucovorin , oxaliplatin  (ELOXATIN )   To help prevent nausea and vomiting after your treatment, we encourage you to take your nausea medication as directed.  BELOW ARE SYMPTOMS THAT SHOULD BE REPORTED IMMEDIATELY: *FEVER GREATER THAN 100.4 F (38 C) OR HIGHER *CHILLS OR SWEATING *NAUSEA AND VOMITING THAT IS NOT CONTROLLED WITH YOUR NAUSEA MEDICATION *UNUSUAL SHORTNESS OF BREATH *UNUSUAL BRUISING OR BLEEDING *URINARY PROBLEMS (pain or burning when urinating, or frequent urination) *BOWEL PROBLEMS (unusual diarrhea, constipation, pain near the anus) TENDERNESS IN MOUTH AND THROAT WITH OR WITHOUT PRESENCE OF ULCERS (sore throat, sores in mouth, or a toothache) UNUSUAL RASH, SWELLING OR PAIN  UNUSUAL VAGINAL DISCHARGE OR ITCHING   Items with * indicate a potential emergency and should be followed up as soon as possible or go to the Emergency Department if any problems should occur.  Please show  the CHEMOTHERAPY ALERT CARD or IMMUNOTHERAPY ALERT CARD at check-in to the Emergency Department and triage nurse.  Should you have questions after your visit or need to cancel or reschedule your appointment, please contact [facility] - A DEPT OF [name]  Dept: [phone]  and follow the prompts.  Office hours are 8:00 a.m. to 4:30 p.m. Monday - Friday. Please note that voicemails left after 4:00 p.m. may not be returned until the following business day.  We are closed weekends and major holidays. You have access to a nurse at all times for urgent questions. Please call the main number to the clinic Dept: [phone] and follow the prompts.   For any non-urgent questions, you may also contact your provider using MyChart. We now offer e-Visits for anyone 73 and older to request care online for non-urgent symptoms. For details visit [email or web address].   Also download the MyChart app! Go to the app store, search MyChart, open the app, select [facility], and log in with your MyChart username and password.

## 2023-12-15 ENCOUNTER — Telehealth: Payer: Self-pay

## 2023-12-15 ENCOUNTER — Other Ambulatory Visit: Payer: Self-pay

## 2023-12-15 NOTE — Telephone Encounter (Signed)
-----   Message from Nurse Damien ORN sent at 12/14/2023  1:48 PM EDT ----- Regarding: Dr Autumn 1st time Folfox Tx changed to folfox today. Tolerated well. Please f/u. Thanks!

## 2023-12-16 ENCOUNTER — Inpatient Hospital Stay

## 2023-12-20 ENCOUNTER — Inpatient Hospital Stay

## 2023-12-20 ENCOUNTER — Inpatient Hospital Stay: Admitting: Nurse Practitioner

## 2023-12-21 ENCOUNTER — Inpatient Hospital Stay (HOSPITAL_BASED_OUTPATIENT_CLINIC_OR_DEPARTMENT_OTHER): Admitting: Oncology

## 2023-12-21 ENCOUNTER — Encounter: Payer: Self-pay | Admitting: Oncology

## 2023-12-21 ENCOUNTER — Inpatient Hospital Stay

## 2023-12-21 VITALS — BP 111/77 | HR 99 | Temp 98.2°F | Resp 18 | Ht 73.5 in | Wt 172.2 lb

## 2023-12-21 DIAGNOSIS — C221 Intrahepatic bile duct carcinoma: Secondary | ICD-10-CM | POA: Diagnosis not present

## 2023-12-21 DIAGNOSIS — Z5111 Encounter for antineoplastic chemotherapy: Secondary | ICD-10-CM | POA: Diagnosis not present

## 2023-12-21 DIAGNOSIS — R21 Rash and other nonspecific skin eruption: Secondary | ICD-10-CM

## 2023-12-21 DIAGNOSIS — Z79899 Other long term (current) drug therapy: Secondary | ICD-10-CM | POA: Diagnosis not present

## 2023-12-21 LAB — CBC WITH DIFFERENTIAL (CANCER CENTER ONLY)
Abs Immature Granulocytes: 0.21 K/uL — ABNORMAL HIGH (ref 0.00–0.07)
Basophils Absolute: 0.1 K/uL (ref 0.0–0.1)
Basophils Relative: 1 %
Eosinophils Absolute: 0.1 K/uL (ref 0.0–0.5)
Eosinophils Relative: 1 %
HCT: 40 % (ref 39.0–52.0)
Hemoglobin: 13.4 g/dL (ref 13.0–17.0)
Immature Granulocytes: 2 %
Lymphocytes Relative: 18 %
Lymphs Abs: 1.7 K/uL (ref 0.7–4.0)
MCH: 29.7 pg (ref 26.0–34.0)
MCHC: 33.5 g/dL (ref 30.0–36.0)
MCV: 88.7 fL (ref 80.0–100.0)
Monocytes Absolute: 0.4 K/uL (ref 0.1–1.0)
Monocytes Relative: 5 %
Neutro Abs: 7 K/uL (ref 1.7–7.7)
Neutrophils Relative %: 73 %
Platelet Count: 199 K/uL (ref 150–400)
RBC: 4.51 MIL/uL (ref 4.22–5.81)
RDW: 16.8 % — ABNORMAL HIGH (ref 11.5–15.5)
WBC Count: 9.5 K/uL (ref 4.0–10.5)
nRBC: 0 % (ref 0.0–0.2)

## 2023-12-21 LAB — CMP (CANCER CENTER ONLY)
ALT: 36 U/L (ref 0–44)
AST: 22 U/L (ref 15–41)
Albumin: 3.4 g/dL — ABNORMAL LOW (ref 3.5–5.0)
Alkaline Phosphatase: 102 U/L (ref 38–126)
Anion gap: 4 — ABNORMAL LOW (ref 5–15)
BUN: 24 mg/dL — ABNORMAL HIGH (ref 6–20)
CO2: 30 mmol/L (ref 22–32)
Calcium: 9 mg/dL (ref 8.9–10.3)
Chloride: 106 mmol/L (ref 98–111)
Creatinine: 1.04 mg/dL (ref 0.61–1.24)
GFR, Estimated: >60 mL/min (ref >60)
Glucose, Bld: 144 mg/dL — ABNORMAL HIGH (ref 70–99)
Potassium: 3.4 mmol/L — ABNORMAL LOW (ref 3.5–5.1)
Sodium: 140 mmol/L (ref 135–145)
Total Bilirubin: 0.4 mg/dL (ref 0.0–1.2)
Total Protein: 5.7 g/dL — ABNORMAL LOW (ref 6.5–8.1)

## 2023-12-21 NOTE — Assessment & Plan Note (Signed)
 Developed a facial skin rash, starting mildly on Wednesday and worsening by Saturday. Similar rashes occurred before but not as severe. Rash associated with stinging, not itching, and is not infected. Likely related to chemotherapy, which can cause skin sensitivity and hand-foot syndrome. Tried aloe, Benadryl  cream, and Vanicream with limited success. Using oral steroids for Crohn's disease, not specifically for the rash. - Try cortisone cream with aloe vera for the rash, limiting use on the face to no more than two to three days. - Consider using a moisturizer like Aquaphor twice daily to prevent hand-foot syndrome. - Avoid prolonged use of steroid creams on the face. - Advised to avoid sun exposure

## 2023-12-21 NOTE — Assessment & Plan Note (Signed)
 Please review oncology history for additional details and timeline of events.    Intrahepatic cholangiocarcinoma, diagnosed following MRI and liver biopsy in May 2025.  He underwent ultrasound-guided biopsy of liver lesion on 07/29/2023.  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 CDX-2 positive, CK20 positive, CK7 positive, synaptophysin negative, chromogranin negative.  IHC profile suggested a gastrointestinal or pancreaticobiliary primary.  Clinical picture is consistent with intrahepatic cholangiocarcinoma, primarily located in the right lobe of the liver with disease limited to the liver area. A new small lesion on the dome of the left side of the liver noted on PET scan and also recent CT scan.  Too small to be biopsied.  Plan made to monitor this on future scans.   His case was previously discussed in GI multidisciplinary tumor conference. 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  Given history of autoimmune hepatitis, primary sclerosing cholangitis, he would not be a candidate for immunotherapy.  Hence plan would be to proceed with chemotherapy using cisplatin  and gemcitabine .  Following drainage of pelvic/anal abscess, once we have obtained clearance from the surgery department, and once his C. difficile was cleared, he was started on systemic chemotherapy with cisplatin  and gemcitabine  from 09/28/2023.  Plan was to continue once weekly for two weeks on, one week off, depending on tolerance.   Following completion of 3 cycles of chemotherapy, restaging MRI of the abdomen on 12/07/2023 showed progressive disease with diffuse mets in the liver.  Discussed results with the patient in detail previously.  Clinically he seemed to have improved compared to prior.  This was an unfortunate development.  Reviewed NCCN guidelines  and discussed treatment options.  Proposed switching treatments to FOLFOX.  Discussed side effect profile including diarrhea, hand-foot syndrome with 5-FU and cold sensitivity, progressive neuropathy with oxaliplatin in addition to other side effects.  Patient verbalized understanding and gave consent to proceed with this treatment option.  Proceeded with cycle 1 of FOLFOX from 12/14/2023.  Plan is to continue this every 2 weeks with restaging imaging in approximately 3 months from the time we switched treatments.  Tolerated cycle 1 of FOLFOX reasonably well.  He did develop a rash on his face, which he had previously also.  Recommended avoiding sun exposure, aloe vera cream with topical cortisone.  He will also follow-up with his dermatologist.  Labs today showed unremarkable CBCD.  CMP also grossly unremarkable.  - Monitor blood counts, liver function, and for side effects such as nausea, fatigue, hand-foot syndrome, diarrhea and neuropathy. - Advise on precautions with cold sensitivity: eat and drink at room temperature, wear gloves when handling cold items.  - Monitor CA 19-9 and CEA levels periodically.   RTC in 1 week for cycle 2 of FOLFOX.

## 2023-12-21 NOTE — Progress Notes (Signed)
 [name]  ONCOLOGY CLINIC PROGRESS NOTE   Patient Care Team: [name], MD as PCP - General (Family Medicine) Avram Lupita BRAVO, MD as Attending Physician (Gastroenterology) Harvey Carlin BRAVO, MD (Inactive) as Attending Physician (Vascular Surgery) Ardis Evalene CROME, RN as Oncology Nurse Navigator Jerret Mcbane, Chinita, MD as Consulting Physician (Hematology and Oncology)  PATIENT NAME: [name]   MR#: [id number] DOB: [birth year]  Date of visit: 12/21/2023   ASSESSMENT & PLAN:   [name] is a 58 y.o.  gentleman with a past medical history of primary sclerosing cholangitis, autoimmune hepatitis, Crohn's disease, anxiety/depression, PTSD, past C. difficile colitis, chronic pain syndrome, chronic mesenteric ischemia, basal cell carcinoma of left lower extremity, history of suicide attempt, past portal vein thrombosis, was referred to our clinic in June 2025 for newly diagnosed intrahepatic cholangiocarcinoma.  cT2, cN0, cM1.  Intrahepatic cholangiocarcinoma ([name]) Please review oncology history for additional details and timeline of events.    Intrahepatic cholangiocarcinoma, diagnosed following MRI and liver biopsy in May 2025.  He underwent ultrasound-guided biopsy of liver lesion on 07/29/2023.  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 CDX-2 positive, CK20 positive, CK7 positive, synaptophysin negative, chromogranin negative.  IHC profile suggested a gastrointestinal or pancreaticobiliary primary.  Clinical picture is consistent with intrahepatic cholangiocarcinoma, primarily located in the right lobe of the liver with disease limited to the liver area. A new small lesion on the dome of the left side of the liver noted on PET scan and also recent CT scan.  Too small to be biopsied.  Plan made to monitor this on future scans.   His case was previously discussed in GI  multidisciplinary tumor conference. 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  Given history of autoimmune hepatitis, primary sclerosing cholangitis, he would not be a candidate for immunotherapy.  Hence plan would be to proceed with chemotherapy using cisplatin  and gemcitabine .  Following drainage of pelvic/anal abscess, once we have obtained clearance from the surgery department, and once his C. difficile was cleared, he was started on systemic chemotherapy with cisplatin  and gemcitabine  from 09/28/2023.  Plan was to continue once weekly for two weeks on, one week off, depending on tolerance.   Following completion of 3 cycles of chemotherapy, restaging MRI of the abdomen on 12/07/2023 showed progressive disease with diffuse mets in the liver.  Discussed results with the patient in detail previously.  Clinically he seemed to have improved compared to prior.  This was an unfortunate development.  Reviewed NCCN guidelines and discussed treatment options.  Proposed switching treatments to FOLFOX.  Discussed side effect profile including diarrhea, hand-foot syndrome with 5-FU and cold sensitivity, progressive neuropathy with oxaliplatin in addition to other side effects.  Patient verbalized understanding and gave consent to proceed with this treatment option.  Proceeded with cycle 1 of FOLFOX from 12/14/2023.  Plan is to continue this every 2 weeks with restaging imaging in approximately 3 months from the time we switched treatments.  Tolerated cycle 1 of FOLFOX reasonably well.  He did develop a rash on his face, which he had previously also.  Recommended avoiding sun exposure, aloe vera cream with topical cortisone.  He will also follow-up with his dermatologist.  Labs today showed unremarkable CBCD.  CMP also grossly unremarkable.  - Monitor blood counts, liver function, and for side effects such as  nausea, fatigue, hand-foot syndrome, diarrhea  and neuropathy. - Advise on precautions with cold sensitivity: eat and drink at room temperature, wear gloves when handling cold items.  - Monitor CA 19-9 and CEA levels periodically.   RTC in 1 week for cycle 2 of FOLFOX.  Skin rash Developed a facial skin rash, starting mildly on Wednesday and worsening by Saturday. Similar rashes occurred before but not as severe. Rash associated with stinging, not itching, and is not infected. Likely related to chemotherapy, which can cause skin sensitivity and hand-foot syndrome. Tried aloe, Benadryl  cream, and Vanicream with limited success. Using oral steroids for Crohn's disease, not specifically for the rash. - Try cortisone cream with aloe vera for the rash, limiting use on the face to no more than two to three days. - Consider using a moisturizer like Aquaphor twice daily to prevent hand-foot syndrome. - Avoid prolonged use of steroid creams on the face. - Advised to avoid sun exposure  History of Clostridioides difficile infection Clostridioides difficile infection has resolved.   I reviewed lab results and outside records for this visit and discussed relevant results with the patient. Diagnosis, plan of care and treatment options were also discussed in detail with the patient. Opportunity provided to ask questions and answers provided to his apparent satisfaction. Provided instructions to call our clinic with any problems, questions or concerns prior to return visit. I recommended to continue follow-up with PCP and sub-specialists. He verbalized understanding and agreed with the plan.   NCCN guidelines have been consulted in the planning of this patient's care.  I spent a total of 32 minutes during this encounter with the patient including review of chart and various tests results, discussions about plan of care and coordination of care plan.   Chinita Patten, MD  12/21/2023 10:05 AM  [facility] - A DEPT OF [facility] [address] [place] [address] Dept: [phone] Dept Fax: [phone]    CHIEF COMPLAINT/ REASON FOR VISIT:   Intrahepatic cholangiocarcinoma, diagnosed in June 2025.  Current Treatment: Plan made for systemic treatment with cisplatin  and gemcitabine , followed by surgical evaluation.  Started on systemic chemotherapy with cisplatin  and gemcitabine  from 09/28/2023.  Immunotherapy avoided because of his history of autoimmune hepatitis and primary sclerosing cholangitis.  Following completion of 3 cycles of chemotherapy, restaging MRI of the abdomen on 12/07/2023 showed progressive disease with diffuse mets in the liver.  Treatment switched to FOLFOX from 12/14/2023.  INTERVAL HISTORY:    Discussed the use of AI scribe software for clinical note transcription with the patient, who gave verbal consent to proceed.  History of Present Illness Amare Bail is a 58 year old male with Crohn's disease and Grover's disease who presents with a facial rash, after receiving chemotherapy last week for his biliary cancer.  He developed a facial rash that began as a small area on Wednesday and worsened by Saturday. The rash is more severe than previous episodes, with a stinging sensation rather than itching. No sun exposure has occurred, and he has not consulted a dermatologist for this episode.  He is currently on oral steroids prescribed for Crohn's disease, but not specifically for the rash. Topical treatments attempted include aloe, which does not burn, and Benadryl  cream, which has been effective. He also tried a lotion recommended for cancer patients, likely Vanicream, which stung when applied to his face but was tolerable on his legs. He possesses a steroid cream for Grover's disease  but is unsure of its safety for facial use.  He is undergoing chemotherapy, which he associates with  the rash. He experiences tingling and cold sensitivity on the day of chemotherapy, which resolves. No diarrhea is present.   I have reviewed the past medical history, past surgical history, social history and family history with the patient and they are unchanged from previous note.  HISTORY OF PRESENT ILLNESS:   ONCOLOGY HISTORY:   He has a history of autoimmune hepatitis, primary sclerosing cholangitis diagnosed in 2012, and Crohn's disease for which he is currently taking prednisone  and Entyvio .   He was admitted to the hospital on 07/02/2023 after he presented with complaints of multiple episodes of diarrhea, dizziness, near syncope.  CT scan of the abdomen and pelvis on 07/02/2023 showed new 3.7 x 2.8 cm noncystic lesion in the dome of the liver, which was not present on prior studies.  Given history of primary sclerosing cholangitis, there was concern for cholangiocarcinoma and MRI was recommended.   On 07/03/2023, MRI of the abdomen showed 4.3 x 2.8 x 3.7 cm lesion in the lateral aspect of the hepatic dome which showed heterogeneous enhancement after IV contrast administration, suspicious for neoplasm.  Cholangiocarcinoma was considered because of his history of primary sclerosing cholangitis.  Inferior and posterior to this lesion was a cyst measuring 5 x 4.5 cm, slightly increased in size compared to previous MRI.  Bile duct showed variable dilatation and stenosis.   Patient did not want to stay in the hospital for biopsy at that time.   He underwent ultrasound-guided biopsy of liver lesion on 07/29/2023.  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 CDX-2 positive, CK20 positive, CK7 positive, synaptophysin negative, chromogranin negative.  IHC profile suggested a gastrointestinal or pancreaticobiliary primary.   To look for a primary, patient underwent upper endoscopy and colonoscopy on 08/18/2023, by Dr. Avram.  EGD  showed evidence of gastritis, single gastric polyp.  Otherwise unremarkable.  Colonoscopy showed several polyps in the transverse colon, sigmoid colon, descending colon.  Evidence of diverticulosis in the sigmoid colon.  Multiple biopsies were obtained.  Biopsies from the stomach, colon, cecum all showed benign findings.  No evidence of malignancy noted.   Clinical picture is consistent with intrahepatic cholangiocarcinoma.   Given history of autoimmune hepatitis, primary sclerosing cholangitis, he would not be a candidate for immunotherapy.  Hence plan would be to proceed with chemotherapy using cisplatin  and gemcitabine .   Request submitted for NGS testing on the specimen and liquid biopsy.  Liquid biopsy showed no actionable mutations.   On 09/09/2023, staging PET scan showed marginal areas of uptake along the mass lesion in the posterior superior right hepatic lobe, corresponding to the area of known neoplasm.  There were small cystic areas elsewhere in the liver, new small low-density focus in the dome of left hepatic lobe.  No appreciable abnormal uptake.  No areas of abnormal uptake seen above the diaphragm.  Findings consistent with anal fistula noted.  His case was previously discussed in GI multidisciplinary tumor conference.  Dr. Dasie felt that he can be a surgical candidate following neoadjuvant chemotherapy.  Following drainage of pelvic/anal abscess, once we have obtained clearance from the surgery department, and once his C. difficile was cleared, he was started on systemic chemotherapy with cisplatin  and gemcitabine  from 09/28/2023.  Plan made to continue once weekly for two weeks on, one week off, depending on tolerance.   Following completion of 3 cycles  of chemotherapy, restaging MRI of the abdomen on 12/07/2023 showed progressive disease with diffuse mets in the liver.  Treatment switched to FOLFOX from 12/14/2023.   Oncology History  Intrahepatic cholangiocarcinoma ([name])   08/18/2023 Initial Diagnosis   Cholangiocarcinoma metastatic to liver ([name])   08/26/2023 Cancer Staging   Staging form: Intrahepatic Bile Duct, AJCC 8th Edition - Clinical: Stage IV (cT2, cN0, cM1) - Signed by Autumn Millman, MD on 12/14/2023   09/28/2023 - 11/30/2023 Chemotherapy   Patient is on Treatment Plan : BILIARY TRACT Cisplatin  + Gemcitabine  D1,8 q21d     12/14/2023 -  Chemotherapy   Patient is on Treatment Plan : FOLFOX q14d         REVIEW OF SYSTEMS:   Review of Systems - Oncology  All other pertinent systems were reviewed with the patient and are negative.  ALLERGIES: He is allergic to tape, asacol  [mesalamine ], azathioprine, gluten meal, metoprolol  tartrate, mycophenolate mofetil, other, wheat, abilify  [aripiprazole ], and amoxicillin .  MEDICATIONS:  Current Outpatient Medications  Medication Sig Dispense Refill   Cholecalciferol 50 MCG (2000 UT) CAPS Take 1 capsule by mouth daily.     dexamethasone  (DECADRON ) 4 MG tablet Take 2 tablets (8 mg total) by mouth daily. Start the day after chemotherapy for 2 days. Take with food. 30 tablet 1   ferrous sulfate  325 (65 FE) MG tablet Take 325 mg by mouth daily with breakfast. Continues to take per low HGB     hydrOXYzine  (ATARAX ) 25 MG tablet Take 1 tablet (25 mg total) by mouth 3 (three) times daily as needed for anxiety. 30 tablet 0   lidocaine -prilocaine  (EMLA ) cream Apply to affected area once 30 g 3   Magnesium  125 MG CAPS One at bedtime     mirtazapine  (REMERON ) 30 MG tablet Take 0.5 tablets (15 mg total) by mouth at bedtime.     Nutritional Supplements (ENSURE HIGH PROTEIN) LIQD Take 237 mLs by mouth in the morning and at bedtime.     ondansetron  (ZOFRAN ) 8 MG tablet Take 1 tablet (8 mg total) by mouth every 8 (eight) hours as needed for nausea or vomiting. Start on the third day after chemotherapy. 30 tablet 1   Potassium 99 MG TABS Take 1 tablet by mouth daily.     predniSONE  (DELTASONE ) 20 MG tablet Take 1 tablet (20  mg total) by mouth daily with breakfast. 90 tablet 1   prochlorperazine  (COMPAZINE ) 10 MG tablet Take 1 tablet (10 mg total) by mouth every 6 (six) hours as needed for nausea or vomiting. 30 tablet 1   Vedolizumab  (ENTYVIO  IV) Inject into the vein every 6 (six) weeks.     venlafaxine  XR (EFFEXOR -XR) 150 MG 24 hr capsule Take 1 capsule (150 mg total) by mouth daily with breakfast. 30 capsule 0   venlafaxine  XR (EFFEXOR -XR) 75 MG 24 hr capsule Take 75 mg by mouth daily.     No current facility-administered medications for this visit.     VITALS:   Blood pressure 111/77, pulse 99, temperature 98.2 F (36.8 C), temperature source Temporal, resp. rate 18, height 6' 1.5 (1.867 m), weight 172 lb 3.2 oz (78.1 kg), SpO2 98%.  Wt Readings from Last 3 Encounters:  12/21/23 172 lb 3.2 oz (78.1 kg)  12/14/23 173 lb (78.5 kg)  11/30/23 172 lb (78 kg)    Body mass index is 22.41 kg/m.    Onc Performance Status - 12/21/23 0927       ECOG Perf Status   ECOG Perf Status  Restricted in physically strenuous activity but ambulatory and able to carry out work of a light or sedentary nature, e.g., light house work, office work      KPS SCALE   KPS % SCORE Able to carry on normal activity, minor s/s of disease            PHYSICAL EXAM:   Physical Exam Constitutional:      General: He is not in acute distress.    Appearance: Normal appearance.  HENT:     Head: Normocephalic and atraumatic.  Eyes:     Conjunctiva/sclera: Conjunctivae normal.  Cardiovascular:     Rate and Rhythm: Normal rate and regular rhythm.  Pulmonary:     Effort: Pulmonary effort is normal. No respiratory distress.  Chest:     Comments: Port-A-Cath in place without any signs of infection Abdominal:     General: There is no distension.  Skin:    Findings: Rash (Scattered, maculopapular rash on his face bilaterally) present.  Neurological:     General: No focal deficit present.     Mental Status: He is alert and  oriented to person, place, and time.  Psychiatric:        Mood and Affect: Mood normal.        Behavior: Behavior normal.       LABORATORY DATA:   I have reviewed the data as listed.  Results for orders placed or performed in visit on 12/21/23  CMP (Cancer Center only)  Result Value Ref Range   Sodium 140 135 - 145 mmol/L   Potassium 3.4 (L) 3.5 - 5.1 mmol/L   Chloride 106 98 - 111 mmol/L   CO2 30 22 - 32 mmol/L   Glucose, Bld 144 (H) 70 - 99 mg/dL   BUN 24 (H) 6 - 20 mg/dL   Creatinine 8.95 9.38 - 1.24 mg/dL   Calcium 9.0 8.9 - 89.6 mg/dL   Total Protein 5.7 (L) 6.5 - 8.1 g/dL   Albumin 3.4 (L) 3.5 - 5.0 g/dL   AST 22 15 - 41 U/L   ALT 36 0 - 44 U/L   Alkaline Phosphatase 102 38 - 126 U/L   Total Bilirubin 0.4 0.0 - 1.2 mg/dL   GFR, Estimated >39 >39 mL/min   Anion gap 4 (L) 5 - 15  CBC with Differential (Cancer Center Only)  Result Value Ref Range   WBC Count 9.5 4.0 - 10.5 K/uL   RBC 4.51 4.22 - 5.81 MIL/uL   Hemoglobin 13.4 13.0 - 17.0 g/dL   HCT 59.9 60.9 - 47.9 %   MCV 88.7 80.0 - 100.0 fL   MCH 29.7 26.0 - 34.0 pg   MCHC 33.5 30.0 - 36.0 g/dL   RDW 83.1 (H) 88.4 - 84.4 %   Platelet Count 199 150 - 400 K/uL   nRBC 0.0 0.0 - 0.2 %   Neutrophils Relative % 73 %   Neutro Abs 7.0 1.7 - 7.7 K/uL   Lymphocytes Relative 18 %   Lymphs Abs 1.7 0.7 - 4.0 K/uL   Monocytes Relative 5 %   Monocytes Absolute 0.4 0.1 - 1.0 K/uL   Eosinophils Relative 1 %   Eosinophils Absolute 0.1 0.0 - 0.5 K/uL   Basophils Relative 1 %   Basophils Absolute 0.1 0.0 - 0.1 K/uL   Immature Granulocytes 2 %   Abs Immature Granulocytes 0.21 (H) 0.00 - 0.07 K/uL       RADIOGRAPHIC STUDIES:  No recent pertinent imaging available to  review.  CODE STATUS:  Code Status History     Date Active Date Inactive Code Status Order ID Comments User Context   09/14/2023 1143 09/15/2023 0511 Full Code [id number]  Johann Sieving, MD HOV   08/20/2023 1401 08/23/2023 1406 Full Code [id number]   Lou Claretta HERO, MD ED   07/29/2023 1419 07/30/2023 0518 Full Code [id number]  Johann Sieving, MD HOV   07/02/2023 1533 07/05/2023 2312 Full Code [id number]  Celinda Alm Lot, MD ED   04/13/2023 1949 05/20/2023 1942 Full Code [id number]  Motley-Mangrum, Cathaleen LABOR, PMHNP Inpatient   03/28/2023 2234 04/04/2023 1348 Full Code [id number]  Alan Thurman GAILS, NP Inpatient   03/28/2023 1136 03/28/2023 2111 Full Code [id number]  Minnie Tinnie BRAVO, PA ED   08/26/2018 1234 09/01/2018 2105 Full Code [id number]  Arloa Chroman, PA-C ED   08/25/2018 1240 08/25/2018 2000 Full Code [id number]  Little, Vernell Search, MD ED   07/31/2018 1406 08/01/2018 1446 Full Code [id number]  Zada Elouise BROCKS, PA-C ED   07/19/2018 2045 07/21/2018 1739 DNR [id number]  [place], Eric J, DO Inpatient   05/28/2018 1506 06/01/2018 1222 DNR [id number]  Barbarann Nest, MD Inpatient   05/20/2012 1059 05/25/2012 1606 DNR [id number]  Cherisse Ronal ORN, NP Inpatient   05/18/2012 1723 05/20/2012 1059 Full Code [id number]  Juvenal Harlene PENNER, DO Inpatient    Questions for Most Recent Historical Code Status (Order [id number])     Question Answer   By: Other            Orders Placed This Encounter  Procedures   CBC with Differential (Cancer Center Only)    Standing Status:   Future    Expected Date:   01/11/2024    Expiration Date:   01/10/2025   CMP (Cancer Center only)    Standing Status:   Future    Expected Date:   01/11/2024    Expiration Date:   01/10/2025     Future Appointments  Date Time Provider Department Center  12/29/2023 11:45 AM [facility] FLUSH [facility] None  12/29/2023 12:00 PM Kanan Sobek, MD [facility] None  12/29/2023 12:30 PM [facility] INFUSION [facility] None  12/31/2023  2:15 PM [facility] FLUSH [facility] None     This document was completed utilizing speech recognition software. Grammatical errors, random word insertions, pronoun errors, and incomplete sentences are an occasional consequence of this system due to software  limitations, ambient noise, and hardware issues. Any formal questions or concerns about the content, text or information contained within the body of this dictation should be directly addressed to the provider for clarification.

## 2023-12-22 ENCOUNTER — Other Ambulatory Visit: Payer: Self-pay

## 2023-12-22 DIAGNOSIS — F411 Generalized anxiety disorder: Secondary | ICD-10-CM | POA: Diagnosis not present

## 2023-12-22 DIAGNOSIS — F330 Major depressive disorder, recurrent, mild: Secondary | ICD-10-CM | POA: Diagnosis not present

## 2023-12-27 DIAGNOSIS — M9903 Segmental and somatic dysfunction of lumbar region: Secondary | ICD-10-CM | POA: Diagnosis not present

## 2023-12-28 DIAGNOSIS — K610 Anal abscess: Secondary | ICD-10-CM | POA: Diagnosis not present

## 2023-12-28 DIAGNOSIS — K50919 Crohn's disease, unspecified, with unspecified complications: Secondary | ICD-10-CM | POA: Diagnosis not present

## 2023-12-29 ENCOUNTER — Inpatient Hospital Stay

## 2023-12-29 ENCOUNTER — Encounter: Payer: Self-pay | Admitting: Oncology

## 2023-12-29 ENCOUNTER — Inpatient Hospital Stay: Admitting: Oncology

## 2023-12-29 ENCOUNTER — Telehealth: Payer: Self-pay | Admitting: *Deleted

## 2023-12-29 NOTE — Telephone Encounter (Signed)
 Called pt. to check on him since a no show for appt. today. He states he left a message to cancel and he had an abscess drained yesterday and was not up to treatment visit. Message sent to scheduling to reschedule for next week.

## 2023-12-30 ENCOUNTER — Other Ambulatory Visit: Payer: Self-pay

## 2023-12-31 ENCOUNTER — Inpatient Hospital Stay

## 2024-01-02 ENCOUNTER — Encounter: Payer: Self-pay | Admitting: Oncology

## 2024-01-02 DIAGNOSIS — K60311 Anal fistula, simple, initial: Secondary | ICD-10-CM | POA: Diagnosis not present

## 2024-01-02 DIAGNOSIS — K50919 Crohn's disease, unspecified, with unspecified complications: Secondary | ICD-10-CM | POA: Diagnosis not present

## 2024-01-02 NOTE — Progress Notes (Signed)
 REFERRING PHYSICIAN:  Self  PROVIDER:  LONNI OZELL PIZZA, MD  MRN: [id number] DOB: 1966-01-12 DATE OF ENCOUNTER: 01/02/2024  Subjective   Chief Complaint: Perirectal abscess, recurrent   History of Present Illness: [name] is a 58 y.o. male with history of primary sclerosing cholangitis, autoimmune hepatitis, Crohn's disease, anxiety/depression, PTSD, past C. difficile colitis, chronic pain syndrome, chronic mesenteric ischemia, basal cell carcinoma of left lower extremity, history of suicide attempt, past portal vein thrombosis, intrahepatic cholangiocarcinoma (following with medical oncology Dr. Autumn whom is seen in the office today for evaluation of perianal Crohn's + recurrent abscess.  He follows with Dr. Debby in our practice but sees me in the urgent office today.  He does have a longstanding history of Crohn's disease and has undergone prior EUA's with seton placements back in 11/19/2015 and 01/19/2019 for anal fistulas.  Ultimately, he opted to have these removed as they were quite uncomfortable.  He has had numerous recurrent abscesses in the perianal area.  He is currently undergoing systemic treatment for cholangiocarcinoma on cisplatin  and gemcitabine  with his last infusion being on Wednesday 1 week ago.  He is also on Entyvio  for Crohn's disease.  He reports that he has developed 5-hour history now of perianal pain identical to prior abscesses in the identical location where he is had prior abscesses drained.  He last had an abscess drained in our office in June of this year by [facility].  This is at the exact same spot.  He denies any fever or chills.  He wanted to come in when it was still early.  He has active drainage from his known fistula on both the right and left side of the anal area.  This recurrent abscess that he has felt today is in the left posterior position.  INTERVAL HX MRI Liver 12/07/23 1. There is interval increase in the size of  pre-existing right hepatic dome lesion as well as multiple (more than 20), new lesions throughout the liver, compatible with worsening metastases. 2. Multiple other nonacute observations, as described above.  Urgent office 12/28/23 - Puja Maczis PA-C Appeared to have a left posterior perianal abscess, recurrent, however spontaneously draining.  He was started on Cipro /Flagyl .  Returns for recheck.   Has been doing fine.  Denies any changes or acute worsening.  He was taking Cipro  Flagyl  but this caused some dizziness so he took doxycycline  which he has 5 more days have on hand at home.  He is taking this twice daily.  He denies any fever or chills.  He has had new drainage over the last week or 2 from the right side of the perianal area in addition to the left side.  He denies any new symptoms of an abscess which he has had before.   PMH: primary sclerosing cholangitis, autoimmune hepatitis, Crohn's disease, anxiety/depression, PTSD, past C. difficile colitis, chronic pain syndrome, chronic mesenteric ischemia, basal cell carcinoma of left lower extremity, history of suicide attempt, past portal vein thrombosis, intrahepatic cholangiocarcinoma (following with medical oncology Dr. Autumn  PSH: as above  Social Hx: He is here today by himself   Medical History: Past Medical History:  Diagnosis Date  . Anemia   . Anxiety   . Arthritis     There is no problem list on file for this patient.   No past surgical history on file.   Allergies  Allergen Reactions  . Adhesive Tape-Silicones Itching, Other (See Comments) and Rash    Please use paper  tape  . Azathioprine Diarrhea, Nausea and Other (See Comments)    Abd pain, too  . Gluten Diarrhea and Nausea  . Mesalamine  Diarrhea, Nausea and Other (See Comments)    Abd pain, too  . Metoprolol  Tartrate Nausea and Other (See Comments)    Stomach upset  . Mycophenolate Mofetil Diarrhea, Nausea and Other (See Comments)    Abd pain, too  .  Other Diarrhea and Other (See Comments)    NUTS  . Metronidazole  Hcl Nausea    Tolerating flagyl  with no nausea 6/16  . Tree Nut Nausea  . Wheat Bran Nausea  . Adhesive Rash  . Amoxicillin  Other (See Comments)    Lots of gas  . Aripiprazole  Diarrhea    Current Outpatient Medications on File Prior to Visit  Medication Sig Dispense Refill  . dexAMETHasone  (DECADRON ) 4 MG tablet Take 2 tablets (8 mg) by mouth daily x 3 days starting the day after cisplatin  chemotherapy. Take with food.    . hydrOXYzine  (ATARAX ) 25 MG tablet Take 25 mg by mouth 3 (three) times daily as needed    . lidocaine -prilocaine  (EMLA ) cream Apply to affected area once    . mirtazapine  (REMERON ) 30 MG tablet Take 15 mg by mouth    . potassium gluconate 2.5 mEq Tab Take 1 tablet by mouth once daily    . predniSONE  (DELTASONE ) 20 MG tablet Take 30 mg by mouth    . prochlorperazine  (COMPAZINE ) 10 MG tablet Take 10 mg by mouth    . venlafaxine  (EFFEXOR -XR) 150 MG XR capsule Take 150 mg by mouth    . busPIRone  (BUSPAR ) 7.5 MG tablet     . ciprofloxacin  HCl (CIPRO ) 500 MG tablet Take 1 tablet (500 mg total) by mouth 2 (two) times daily for 7 days (Patient not taking: Reported on 01/02/2024) 14 tablet 0  . DIFICID  200 mg tablet     . doxycycline  (VIBRA -TABS) 100 MG tablet Take 100 mg by mouth every 12 (twelve) hours    . LORazepam  (ATIVAN ) 1 MG tablet Take 1 mg by mouth    . metroNIDAZOLE  (FLAGYL ) 500 MG tablet Take 1 tablet (500 mg total) by mouth 3 (three) times daily for 7 days (Patient not taking: Reported on 01/02/2024) 21 tablet 0  . neomycin -bacitracnZn-polymyxnB 3.5-500-10,000 mg-unit-unit Oint Apply 1 Application topically     No current facility-administered medications on file prior to visit.    Family History  Problem Relation Age of Onset  . Breast cancer Mother      Social History   Tobacco Use  Smoking Status Former  . Types: Cigarettes  Smokeless Tobacco Never     Social History    Socioeconomic History  . Marital status: Unknown  Tobacco Use  . Smoking status: Former    Types: Cigarettes  . Smokeless tobacco: Never  Substance and Sexual Activity  . Alcohol use: Not Currently  . Drug use: Never   Social Drivers of Corporate Investment Banker Strain: Low Risk  (08/28/2019)   Received from [facility]   Overall Financial Resource Strain (CARDIA)   . Difficulty of Paying Living Expenses: Not hard at all  Food Insecurity: No Food Insecurity (08/26/2023)   Received from [facility]   Hunger Vital Sign   . Within the past 12 months, you worried that your food would run out before you got the money to buy more.: Never true   . Within the past 12 months, the food you bought just didn't last and  you didn't have money to get more.: Never true  Transportation Needs: No Transportation Needs (08/26/2023)   Received from [facility] - Transportation   . In the past 12 months, has lack of transportation kept you from medical appointments or from getting medications?: No   . In the past 12 months, has lack of transportation kept you from meetings, work, or from getting things needed for daily living?: No  Physical Activity: Insufficiently Active (11/08/2019)   Received from [facility]   Exercise Vital Sign   . On average, how many days per week do you engage in moderate to strenuous exercise (like a brisk walk)?: 3 days   . On average, how many minutes do you engage in exercise at this level?: 30 min  Stress: Stress Concern Present (08/28/2019)   Received from [facility] - Occupational Stress Questionnaire   . Feeling of Stress : Very much  Social Connections: Socially Isolated (07/02/2023)   Received from [facility]   Social Connection and Isolation Panel   . In a typical week, how many times do you talk on the phone with family, friends, or neighbors?: Once a week   . How often do you get together with friends or  relatives?: Once a week   . How often do you attend church or religious services?: Never   . Do you belong to any clubs or organizations such as church groups, unions, fraternal or athletic groups, or school groups?: No   . How often do you attend meetings of the clubs or organizations you belong to?: Never   . Are you married, widowed, divorced, separated, never married, or living with a partner?: Never married  Housing Stability: High Risk (04/13/2023)   Received from [facility] Stability Vital Sign   . Unable to Pay for Housing in the Last Year: No   . Number of Times Moved in the Last Year: 2   . Homeless in the Last Year: No    Objective:    Vitals:   01/02/24 1020  PainSc: 0-No pain    There is no height or weight on file to calculate BMI.  Constitutional: NAD; conversant Eyes: Moist conjunctiva; anicteric Lungs: Normal respiratory effort CV: RRR; no pitting edema Anorectal: Multiple spontaneously draining fistula externally in the anal margin-1 with drainage in the left lateral position.  Additional in the right posterior/posterior midline.  There is an area of thin skin and fluctuance that I suspect actually communicates with his left lateral fistula.  There is some erythema around this, ~quarter sized.  This is where his discomfort is.  DRE-normal tone.  No palpable fluctuance within the anal canal. Psychiatric: Appropriate affect  A chaperone, Chemira Jones CMA, was present for this encounter and examination  Assessment and Plan:  Diagnoses and all orders for this visit:  Crohn's disease with complication, unspecified gastrointestinal tract location (CMS/HHS-[name])  Anal fistula      [name] is a very pleasant 58 y.o. male with hx of primary sclerosing cholangitis, autoimmune hepatitis, Crohn's disease, anxiety/depression, PTSD, past C. difficile colitis, chronic pain syndrome, chronic mesenteric ischemia, basal cell carcinoma of left lower  extremity, history of suicide attempt, past portal vein thrombosis, intrahepatic cholangiocarcinoma (following with medical oncology Dr. Autumn) whom is seen in the office today for evaluation of perianal Crohn's + recurrent abscess here for evaluation of left posterior perirectal abscess  - We discussed the anatomy and  physiology of the anorectal region and pathophysiology of perirectal abscess - We have reviewed options going forward including further observation vs surgery -surgical treatment of transsphincteric anal fistulous with placement of draining seton; anorectal exam under anesthesia  - The planned procedure, material risks (including, but not limited to, pain, bleeding, infection, scarring, rare cases of pelvic sepsis, need for additional procedures, pneumonia, heart attack, stroke and very rarely death) benefits and alternatives were discussed. - The patient's questions were answered to his satisfaction, he voiced understanding and wishes to proceed with placement of draining setons   Return for Postop appointment 3-5 weeks after surgery.  I spent a total of 30 minutes today in both face-to-face and non-face-to-face activities to perform the following: review records, take history, perform exam, counsel the patient on the diagnosis, and document encounter, findings, and plan in the EHR  for this visit on the date of this encounter.  Lonni Pizza, MD [facility], A [facility]

## 2024-01-03 ENCOUNTER — Other Ambulatory Visit: Payer: Self-pay

## 2024-01-04 ENCOUNTER — Ambulatory Visit: Payer: Self-pay | Admitting: Surgery

## 2024-01-04 ENCOUNTER — Telehealth: Payer: Self-pay

## 2024-01-04 ENCOUNTER — Inpatient Hospital Stay: Admitting: Oncology

## 2024-01-04 ENCOUNTER — Inpatient Hospital Stay

## 2024-01-04 NOTE — Progress Notes (Signed)
 Sent message, via epic in basket, requesting orders in epic from Careers adviser.

## 2024-01-04 NOTE — Telephone Encounter (Signed)
  Called pt. to check on him since a no show for appt. today. He states he lspoke with Dr. Autumn he stated he has three abscess and is not feeling well.  Message sent to scheduling to reschedule

## 2024-01-05 ENCOUNTER — Telehealth: Payer: Self-pay

## 2024-01-05 ENCOUNTER — Other Ambulatory Visit: Payer: Self-pay | Admitting: Oncology

## 2024-01-05 DIAGNOSIS — C221 Intrahepatic bile duct carcinoma: Secondary | ICD-10-CM

## 2024-01-05 NOTE — Telephone Encounter (Signed)
 01/02/24 Per Patient on MyChart I saw Dr Teresa at [facility] today and I now have 3 fistulas, he has scheduled me for 3 seton placements on the 14th at [facility].  I am taking doxycycline  200mg  twice a day, my treatment is Wednesday and I wanted to make sure I can have treatment.  Patient then left message on 01/04/2024 stating, I can't make it today I am sorry my right butt cheek is so swollen and just feel weak. [name]   On 01/04/24  per Dr. Autumn Staff Message the following plan was stated: Let us  go ahead and cancel his appointments.  Let us  plan for treatments tentatively on 01/18/2024 or 01/19/2024.  Please advise patient to call us  back if he feels that he may have to delay treatments further depending on his recovery from surgery.  Dr. Clovis plan was shared with patient and patient agreed.

## 2024-01-06 ENCOUNTER — Inpatient Hospital Stay

## 2024-01-08 ENCOUNTER — Other Ambulatory Visit: Payer: Self-pay

## 2024-01-10 ENCOUNTER — Encounter: Payer: Self-pay | Admitting: Internal Medicine

## 2024-01-10 NOTE — Progress Notes (Signed)
 Date of COVID positive in last 90 days:  PCP - Carlin Dale Gull, MD Cardiologist -  Oncologist- Chinita Patten, MD  Chest x-ray - 04/14/23 Epic EKG - 07/02/23 Epic Stress Test - n/a ECHO - 07/03/23 Epic Cardiac Cath - N/A Pacemaker/ICD device last checked:N/A Spinal Cord Stimulator:N/A  Bowel Prep - N/A  Sleep Study - N/A CPAP -   Fasting Blood Sugar - N/A Checks Blood Sugar _____ times a day  Last dose of GLP1 agonist-  N/A GLP1 instructions:  Do not take after     Last dose of SGLT-2 inhibitors-  N/A SGLT-2 instructions:  Do not take after     Blood Thinner Instructions: N/A Last dose:   Time: Aspirin Instructions:N/A Last Dose:  Activity level: Can go up a flight of stairs and perform activities of daily living without stopping and without symptoms of chest pain or shortness of breath.   Anesthesia review: HTN, sclerosing cholangitis, autoimmune hepatitis, chronic mesenteric ischemia, chemo last in 3 weeks  Patient denies shortness of breath, fever, cough and chest pain at PAT appointment  Patient verbalized understanding of instructions that were given to them at the PAT appointment. Patient was also instructed that they will need to review over the PAT instructions again at home before surgery.

## 2024-01-11 ENCOUNTER — Encounter (HOSPITAL_COMMUNITY): Payer: Self-pay

## 2024-01-11 ENCOUNTER — Other Ambulatory Visit: Payer: Self-pay

## 2024-01-11 ENCOUNTER — Encounter (HOSPITAL_COMMUNITY)
Admission: RE | Admit: 2024-01-11 | Discharge: 2024-01-11 | Disposition: A | Discharge status: Home / Self Care | Source: Ambulatory Visit | Attending: Surgery | Admitting: Surgery

## 2024-01-11 DIAGNOSIS — K754 Autoimmune hepatitis: Secondary | ICD-10-CM | POA: Diagnosis not present

## 2024-01-11 DIAGNOSIS — K5080 Crohn's disease of both small and large intestine without complications: Secondary | ICD-10-CM | POA: Insufficient documentation

## 2024-01-11 DIAGNOSIS — I10 Essential (primary) hypertension: Secondary | ICD-10-CM | POA: Diagnosis not present

## 2024-01-11 DIAGNOSIS — K603 Anal fistula, unspecified: Secondary | ICD-10-CM | POA: Insufficient documentation

## 2024-01-11 DIAGNOSIS — Z87891 Personal history of nicotine dependence: Secondary | ICD-10-CM | POA: Diagnosis not present

## 2024-01-11 DIAGNOSIS — F1011 Alcohol abuse, in remission: Secondary | ICD-10-CM | POA: Diagnosis not present

## 2024-01-11 DIAGNOSIS — C221 Intrahepatic bile duct carcinoma: Secondary | ICD-10-CM | POA: Insufficient documentation

## 2024-01-11 DIAGNOSIS — Z01818 Encounter for other preprocedural examination: Secondary | ICD-10-CM | POA: Insufficient documentation

## 2024-01-11 HISTORY — DX: Cardiac murmur, unspecified: R01.1

## 2024-01-11 NOTE — Patient Instructions (Addendum)
 SURGICAL WAITING ROOM VISITATION  Patients having surgery or a procedure may have no more than 2 support people in the waiting area - these visitors may rotate.    Children under the age of 57 must have an adult with them who is not the patient.  Visitors with respiratory illnesses are discouraged from visiting and should remain at home.  If the patient needs to stay at the hospital during part of their recovery, the visitor guidelines for inpatient rooms apply. Pre-op nurse will coordinate an appropriate time for 1 support person to accompany patient in pre-op.  This support person may not rotate.    Please refer to the [facility] website for the visitor guidelines for Inpatients (after your surgery is over and you are in a regular room).    Your procedure is scheduled on: 01/13/24   Report to [facility] Main Entrance    Report to admitting at 6:55 PM   Call this number if you have problems the morning of surgery [phone]   Do not eat food or drink liquids :After Midnight.          If you have questions, please contact your surgeon's office.   FOLLOW BOWEL PREP AND ANY ADDITIONAL PRE OP INSTRUCTIONS YOU RECEIVED FROM YOUR SURGEON'S OFFICE!!!     Oral Hygiene is also important to reduce your risk of infection.                                    Remember - BRUSH YOUR TEETH THE MORNING OF SURGERY WITH YOUR REGULAR TOOTHPASTE  DENTURES WILL BE REMOVED PRIOR TO SURGERY PLEASE DO NOT APPLY Poly grip OR ADHESIVES!!!   Stop all vitamins and herbal supplements 7 days before surgery.   Take these medicines the morning of surgery with A SIP OF WATER: Prednisone , Effexor              You may not have any metal on your body including jewelry, and body piercing             Do not wear lotions, powders, cologne, or deodorant              Men may shave face and neck.   Do not bring valuables to the hospital. [facility] IS NOT             RESPONSIBLE   FOR  VALUABLES.   Contacts, glasses, dentures or bridgework may not be worn into surgery.  DO NOT BRING YOUR HOME MEDICATIONS TO THE HOSPITAL. PHARMACY WILL DISPENSE MEDICATIONS LISTED ON YOUR MEDICATION LIST TO YOU DURING YOUR ADMISSION IN THE HOSPITAL!    Patients discharged on the day of surgery will not be allowed to drive home.  Someone NEEDS to stay with you for the first 24 hours after anesthesia.              Please read over the following fact sheets you were given: IF YOU HAVE QUESTIONS ABOUT YOUR PRE-OP INSTRUCTIONS PLEASE CALL [phone].   If you received a COVID test during your pre-op visit  it is requested that you wear a mask when out in public, stay away from anyone that may not be feeling well and notify your surgeon if you develop symptoms. If you test positive for Covid or have been in contact with anyone that has tested positive in the last 10 days please notify you surgeon.  [facility] - Preparing for Surgery Before surgery, you can play an important role.  Because skin is not sterile, your skin needs to be as free of germs as possible.  You can reduce the number of germs on your skin by washing with CHG (chlorahexidine gluconate) soap before surgery.  CHG is an antiseptic cleaner which kills germs and bonds with the skin to continue killing germs even after washing. Please DO NOT use if you have an allergy to CHG or antibacterial soaps.  If your skin becomes reddened/irritated stop using the CHG and inform your nurse when you arrive at Short Stay. Do not shave (including legs and underarms) for at least 48 hours prior to the first CHG shower.  You may shave your face/neck.  Please follow these instructions carefully:  1.  Shower with CHG Soap the night before surgery ONLY (DO NOT USE THE SOAP THE MORNING OF SURGERY).  2.  If you choose to wash your hair, wash your hair first as usual with your normal  shampoo.  3.  After you shampoo, rinse your hair and body  thoroughly to remove the shampoo.                             4.  Use CHG as you would any other liquid soap.  You can apply chg directly to the skin and wash.  Gently with a scrungie or clean washcloth.  5.  Apply the CHG Soap to your body ONLY FROM THE NECK DOWN.   Do   not use on face/ open                           Wound or open sores. Avoid contact with eyes, ears mouth and   genitals (private parts).                       Wash face,  Genitals (private parts) with your normal soap.             6.  Wash thoroughly, paying special attention to the area where your    surgery  will be performed.  7.  Thoroughly rinse your body with warm water from the neck down.  8.  DO NOT shower/wash with your normal soap after using and rinsing off the CHG Soap.                9.  Pat yourself dry with a clean towel.            10.  Wear clean pajamas.            11.  Place clean sheets on your bed the night of your first shower and do not  sleep with pets. Day of Surgery : Do not apply any CHG, lotions/deodorants the morning of surgery.  Please wear clean clothes to the hospital/surgery center.  FAILURE TO FOLLOW THESE INSTRUCTIONS MAY RESULT IN THE CANCELLATION OF YOUR SURGERY  PATIENT SIGNATURE_________________________________  NURSE SIGNATURE__________________________________  ________________________________________________________________________

## 2024-01-12 NOTE — Progress Notes (Signed)
 Anesthesia Chart Review   Case: [id number] Date/Time: 01/13/24 0854   Procedures:      ANAL FISTULOTOMY - SURGICAL TREATMENT OF TRANSSPHINCTERIC ANAL FISTULAS X2 WITH PLACEMENT OF DRAINING SETON, ANORECTAL EXAM UNDER ANESTHESIA     PLACEMENT, SETON     EXAM UNDER ANESTHESIA, RECTUM   Anesthesia type: General   Pre-op diagnosis: ANAL FISTULA   Location: [facility] ROOM 04 / [facility]   Surgeons: Teresa Lonni HERO, MD       DISCUSSION:58 y.o. former smoker with h/o HTN, primary sclerosing cholangitis, autoimmune hepatitis, Chron's disease, anal fistula scheduled for above procedure 01/13/24 with Dr. Lonni Teresa.   H/o alcohol abuse in remission.   Pt with intrahepatic cholangiocarcinoma. started on systemic chemotherapy with cisplatin  and gemcitabine  from 09/28/2023. Restaging MRI 12/07/2023 found to have progressive disease with mets to the liver. Started FOLFOX 12/14/23.   Echo 07/03/2023 with EF 70-75%, no valvular problems.  Pt reports he can climb a flight of stairs without chest pain or shortness of breath.  VS: BP 96/71   Pulse 81   Temp (!) 36.3 C (Oral)   Resp 16   Ht 6' 1.5 (1.867 m)   SpO2 100%   BMI 22.41 kg/m   PROVIDERS: [name], MD is PCP   Oncologist- Chinita Patten, MD  LABS: Labs reviewed: Acceptable for surgery. (all labs ordered are listed, but only abnormal results are displayed)  Labs Reviewed - No data to display   IMAGES:   EKG:   CV: Echo 07/03/2023  1. Left ventricular ejection fraction, by estimation, is 70 to 75%. The  left ventricle has hyperdynamic function. The left ventricle has no  regional wall motion abnormalities. There is mild asymmetric left  ventricular hypertrophy of the basal-septal  segment. Left ventricular diastolic parameters are consistent with Grade I  diastolic dysfunction (impaired relaxation).   2. Right ventricular systolic function is normal. The right ventricular  size is normal. Tricuspid regurgitation  signal is inadequate for assessing  PA pressure.   3. The mitral valve is normal in structure. No evidence of mitral valve  regurgitation. No evidence of mitral stenosis.   4. The aortic valve was not well visualized. There is mild calcification  of the aortic valve. There is mild thickening of the aortic valve. Aortic  valve regurgitation is not visualized. No aortic stenosis is present.   5. The inferior vena cava is normal in size with greater than 50%  respiratory variability, suggesting right atrial pressure of 3 mmHg.  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 06/15/2018   Cancer of skin of neck    Cataract    Chronic mesenteric ischemia    Chronic pain syndrome 07/09/2016   Crohn's disease of small and large intestines ([name])    followed by dr lupita gessner   Dairy product intolerance    Diarrhea, functional    Grover's disease    transient acantholytic dermatosis   Heart murmur    History of alcohol abuse    History of basal cell carcinoma excision    2013 left leg   History of Clostridium difficile    10/ 2014   History of multiple concussions    x6  last one Jan 2017 per pt--  no residual   History of substance abuse ([name])    quit 1997 per pt   History of suicide attempt    05-18-2012  overdose /  failure to thrive   Hypertension    Iron  deficiency anemia due to chronic blood loss    Major depression, recurrent, chronic    Osteopenia    Personal history of adenomatous colonic polyps 12/2010, 03/2012   12/2010 - 8 mm serrated adenoma of rectum   Portal vein thrombosis 03/21/2015   right   Primary sclerosing cholangitis ([name])    ? 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    Substance abuse ([name]) 1997   Alcohol   Vitamin A  deficiency 05/23/2018   Vitamin B6 deficiency 07/27/2018   Vitamin C deficiency 05/23/2018    Past Surgical History:  Procedure Laterality Date   ABDOMINAL AORTAGRAM N/A 03/29/2012   Procedure: ABDOMINAL EZELLA;  Surgeon: Gaile LELON New, MD;  Location: [facility] CATH LAB;  Service: Cardiovascular;  Laterality: N/A;   ADENOIDECTOMY  age 72   BIOPSY  05/31/2018   Procedure: BIOPSY;  Surgeon: Teressa Toribio SQUIBB, MD;  [facility];  Service: Endoscopy;;   CATARACT EXTRACTION W/ INTRAOCULAR LENS  IMPLANT, BILATERAL  2009   COLONOSCOPY  2001, 05/02/2003, 01/28/11   2012: Right colon Crohn's, rectal polyp   COLONOSCOPY  03/31/2012   Procedure: COLONOSCOPY;  Surgeon: Gordy CHRISTELLA Starch, MD;  Location: [facility] ENDOSCOPY;  Service: Gastroenterology;  Laterality: N/A;   COLONOSCOPY N/A 08/18/2023   Procedure: COLONOSCOPY;  Surgeon: Avram Lupita BRAVO, MD;  [facility];  Service: Gastroenterology;  Laterality: N/A;   COLONOSCOPY N/A 08/22/2023   Procedure: COLONOSCOPY;  Surgeon: Federico Rosario BROCKS, MD;  [facility];  Service: Gastroenterology;  Laterality: N/A;   COLONOSCOPY WITH PROPOFOL  N/A 05/31/2018   Procedure: COLONOSCOPY WITH PROPOFOL ;  Surgeon: Teressa Toribio SQUIBB, MD;  [facility];  Service: Endoscopy;  Laterality: N/A;   ESOPHAGOGASTRODUODENOSCOPY  01/28/2011   Normal   ESOPHAGOGASTRODUODENOSCOPY N/A 08/18/2023   Procedure: EGD (ESOPHAGOGASTRODUODENOSCOPY);  Surgeon: Avram Lupita BRAVO, MD;  Location: [facility];  Service: Gastroenterology;  Laterality: N/A;   FOOT SURGERY Right age 21   IR IMAGING GUIDED PORT INSERTION  09/14/2023   MOHS SURGERY Left 11/2013   left ankle parakerotosis    PERCUTANEOUS LIVER BIOPSY  2007 and 2008   PILONIDAL CYST EXCISION  age 60   PLACEMENT OF SETON N/A 11/06/2015   Procedure: PLACEMENT OF SETON;  Surgeon: Bernarda Ned, MD;  Location: [facility] ;  Service: General;   Laterality: N/A;   PLACEMENT OF SETON  07/2018   at [facility]   RECTAL EXAM UNDER ANESTHESIA N/A 01/18/2019   Procedure: ANAL EXAM UNDER ANESTHESIA,  INCISION AND DRAINAGE, SETON PLACEMENT;  Surgeon: Ned Bernarda, MD;  Location: [facility];  Service: General;  Laterality: N/A;   UPPER GASTROINTESTINAL ENDOSCOPY      MEDICATIONS:  Cholecalciferol 125 MCG (5000 UT) TABS   ferrous sulfate  325 (65 FE) MG tablet   hydrOXYzine  (ATARAX ) 25 MG tablet   lidocaine -prilocaine  (EMLA ) cream   Magnesium  125 MG CAPS   melatonin 3 MG TABS tablet   metroNIDAZOLE  (FLAGYL ) 500 MG tablet   mirtazapine  (REMERON ) 30 MG tablet   Nutritional Supplements (ENSURE HIGH PROTEIN) LIQD   ondansetron  (ZOFRAN ) 8 MG tablet   Potassium 99 MG TABS   predniSONE  (DELTASONE )  20 MG tablet   prochlorperazine  (COMPAZINE ) 10 MG tablet   Vedolizumab  (ENTYVIO  IV)   venlafaxine  XR (EFFEXOR -XR) 150 MG 24 hr capsule   venlafaxine  XR (EFFEXOR -XR) 75 MG 24 hr capsule   No current facility-administered medications for this encounter.      Harlene Hoots Ward, PA-C [facility] [phone]

## 2024-01-13 ENCOUNTER — Other Ambulatory Visit: Payer: Self-pay

## 2024-01-13 ENCOUNTER — Encounter (HOSPITAL_COMMUNITY)
Admission: RE | Disposition: A | Discharge status: Home / Self Care | Payer: Self-pay | Source: Ambulatory Visit | Attending: Surgery

## 2024-01-13 ENCOUNTER — Ambulatory Visit (HOSPITAL_COMMUNITY): Payer: Self-pay | Admitting: Physician Assistant

## 2024-01-13 ENCOUNTER — Ambulatory Visit (HOSPITAL_COMMUNITY)
Admission: RE | Admit: 2024-01-13 | Discharge: 2024-01-13 | Disposition: A | Discharge status: Home / Self Care | Source: Ambulatory Visit | Attending: Surgery | Admitting: Surgery

## 2024-01-13 ENCOUNTER — Encounter (HOSPITAL_COMMUNITY): Payer: Self-pay | Admitting: Surgery

## 2024-01-13 DIAGNOSIS — M1990 Unspecified osteoarthritis, unspecified site: Secondary | ICD-10-CM | POA: Insufficient documentation

## 2024-01-13 DIAGNOSIS — G894 Chronic pain syndrome: Secondary | ICD-10-CM | POA: Insufficient documentation

## 2024-01-13 DIAGNOSIS — E119 Type 2 diabetes mellitus without complications: Secondary | ICD-10-CM | POA: Diagnosis not present

## 2024-01-13 DIAGNOSIS — I10 Essential (primary) hypertension: Secondary | ICD-10-CM | POA: Diagnosis not present

## 2024-01-13 DIAGNOSIS — F332 Major depressive disorder, recurrent severe without psychotic features: Secondary | ICD-10-CM

## 2024-01-13 DIAGNOSIS — K611 Rectal abscess: Secondary | ICD-10-CM | POA: Insufficient documentation

## 2024-01-13 DIAGNOSIS — K5010 Crohn's disease of large intestine without complications: Secondary | ICD-10-CM | POA: Insufficient documentation

## 2024-01-13 DIAGNOSIS — K60329 Anal fistula, complex, unspecified: Secondary | ICD-10-CM | POA: Diagnosis not present

## 2024-01-13 DIAGNOSIS — M8580 Other specified disorders of bone density and structure, unspecified site: Secondary | ICD-10-CM | POA: Diagnosis not present

## 2024-01-13 DIAGNOSIS — Z87891 Personal history of nicotine dependence: Secondary | ICD-10-CM

## 2024-01-13 DIAGNOSIS — K7460 Unspecified cirrhosis of liver: Secondary | ICD-10-CM | POA: Insufficient documentation

## 2024-01-13 DIAGNOSIS — K50913 Crohn's disease, unspecified, with fistula: Secondary | ICD-10-CM | POA: Diagnosis not present

## 2024-01-13 DIAGNOSIS — K603 Anal fistula, unspecified: Secondary | ICD-10-CM

## 2024-01-13 HISTORY — PX: ANAL FISTULOTOMY: SHX6423

## 2024-01-13 HISTORY — PX: RECTAL EXAM UNDER ANESTHESIA: SHX6399

## 2024-01-13 HISTORY — PX: PLACEMENT OF SETON: SHX6029

## 2024-01-13 LAB — GLUCOSE, CAPILLARY: Glucose-Capillary: 116 mg/dL — ABNORMAL HIGH (ref 70–99)

## 2024-01-13 SURGERY — ANAL FISTULOTOMY
Anesthesia: General

## 2024-01-13 MED ORDER — BUPIVACAINE-EPINEPHRINE (PF) 0.25% -1:200000 IJ SOLN
INTRAMUSCULAR | Status: DC | PRN
  Administered 2024-01-13: 30 mL

## 2024-01-13 MED ORDER — DROPERIDOL 2.5 MG/ML IJ SOLN: 0.6250 mg | Freq: Once | INTRAMUSCULAR | Status: DC | PRN

## 2024-01-13 MED ORDER — 0.9 % SODIUM CHLORIDE (POUR BTL) OPTIME
TOPICAL | Status: DC | PRN
  Administered 2024-01-13: 1000 mL

## 2024-01-13 MED ORDER — BUPIVACAINE LIPOSOME 1.3 % IJ SUSP
INTRAMUSCULAR | Status: AC
  Filled 2024-01-13: qty 20

## 2024-01-13 MED ORDER — CHLORHEXIDINE GLUCONATE CLOTH 2 % EX PADS: 6.0000 | MEDICATED_PAD | Freq: Once | CUTANEOUS | Status: DC

## 2024-01-13 MED ORDER — MIDAZOLAM HCL 2 MG/2ML IJ SOLN
INTRAMUSCULAR | Status: AC
  Filled 2024-01-13: qty 2

## 2024-01-13 MED ORDER — BUPIVACAINE-EPINEPHRINE (PF) 0.25% -1:200000 IJ SOLN
INTRAMUSCULAR | Status: AC
  Filled 2024-01-13: qty 30

## 2024-01-13 MED ORDER — FENTANYL CITRATE (PF) 100 MCG/2ML IJ SOLN
INTRAMUSCULAR | Status: AC
  Filled 2024-01-13: qty 2

## 2024-01-13 MED ORDER — ONDANSETRON HCL 4 MG/2ML IJ SOLN
INTRAMUSCULAR | Status: AC
  Filled 2024-01-13: qty 2

## 2024-01-13 MED ORDER — ACETAMINOPHEN 10 MG/ML IV SOLN: 1000.0000 mg | Freq: Once | INTRAVENOUS | Status: DC | PRN

## 2024-01-13 MED ORDER — PROPOFOL 10 MG/ML IV BOLUS
INTRAVENOUS | Status: DC | PRN
Start: 2024-01-13 — End: 2024-01-13
  Administered 2024-01-13: 150 mg via INTRAVENOUS

## 2024-01-13 MED ORDER — ONDANSETRON HCL 4 MG/2ML IJ SOLN
INTRAMUSCULAR | Status: DC | PRN
  Administered 2024-01-13: 4 mg via INTRAVENOUS

## 2024-01-13 MED ORDER — OXYCODONE HCL 5 MG PO TABS: 5.0000 mg | ORAL_TABLET | Freq: Once | ORAL | Status: DC | PRN

## 2024-01-13 MED ORDER — OXYCODONE HCL 5 MG PO TABS: 5.0000 mg | ORAL_TABLET | Freq: Four times a day (QID) | ORAL | 0 refills | Status: AC | PRN

## 2024-01-13 MED ORDER — FENTANYL CITRATE (PF) 50 MCG/ML IJ SOSY: 25.0000 ug | PREFILLED_SYRINGE | INTRAMUSCULAR | Status: DC | PRN

## 2024-01-13 MED ORDER — ACETAMINOPHEN 10 MG/ML IV SOLN
INTRAVENOUS | Status: AC
  Filled 2024-01-13: qty 100

## 2024-01-13 MED ORDER — LACTATED RINGERS IV SOLN: INTRAVENOUS | Status: DC | PRN

## 2024-01-13 MED ORDER — FENTANYL CITRATE (PF) 100 MCG/2ML IJ SOLN
INTRAMUSCULAR | Status: DC | PRN
  Administered 2024-01-13: 50 ug via INTRAVENOUS
  Administered 2024-01-13: 100 ug via INTRAVENOUS

## 2024-01-13 MED ORDER — DEXAMETHASONE SOD PHOSPHATE PF 10 MG/ML IJ SOLN
INTRAMUSCULAR | Status: DC | PRN
  Administered 2024-01-13: 5 mg via INTRAVENOUS

## 2024-01-13 MED ORDER — CHLORHEXIDINE GLUCONATE 0.12 % MT SOLN
15.0000 mL | Freq: Once | OROMUCOSAL | Status: AC
  Administered 2024-01-13: 15 mL via OROMUCOSAL

## 2024-01-13 MED ORDER — ACETAMINOPHEN 10 MG/ML IV SOLN
INTRAVENOUS | Status: DC | PRN
  Administered 2024-01-13: 1000 mg via INTRAVENOUS

## 2024-01-13 MED ORDER — CIPROFLOXACIN IN D5W 400 MG/200ML IV SOLN
400.0000 mg | INTRAVENOUS | Status: AC
  Administered 2024-01-13: 400 mg via INTRAVENOUS
  Filled 2024-01-13: qty 200

## 2024-01-13 MED ORDER — SUGAMMADEX SODIUM 200 MG/2ML IV SOLN
INTRAVENOUS | Status: DC | PRN
  Administered 2024-01-13: 200 mg via INTRAVENOUS

## 2024-01-13 MED ORDER — MIDAZOLAM HCL 5 MG/5ML IJ SOLN
INTRAMUSCULAR | Status: DC | PRN
Start: 2024-01-13 — End: 2024-01-13
  Administered 2024-01-13: 2 mg via INTRAVENOUS

## 2024-01-13 MED ORDER — AMOXICILLIN-POT CLAVULANATE 875-125 MG PO TABS: 1.0000 | ORAL_TABLET | Freq: Two times a day (BID) | ORAL | 0 refills | Status: AC

## 2024-01-13 MED ORDER — ORAL CARE MOUTH RINSE: 15.0000 mL | Freq: Once | OROMUCOSAL | Status: AC

## 2024-01-13 MED ORDER — LIDOCAINE HCL (CARDIAC) PF 100 MG/5ML IV SOSY
PREFILLED_SYRINGE | INTRAVENOUS | Status: DC | PRN
  Administered 2024-01-13: 80 mg via INTRAVENOUS

## 2024-01-13 MED ORDER — ROCURONIUM BROMIDE 100 MG/10ML IV SOLN
INTRAVENOUS | Status: DC | PRN
Start: 2024-01-13 — End: 2024-01-13
  Administered 2024-01-13: 20 mg via INTRAVENOUS
  Administered 2024-01-13: 50 mg via INTRAVENOUS

## 2024-01-13 MED ORDER — METRONIDAZOLE 500 MG/100ML IV SOLN
500.0000 mg | INTRAVENOUS | Status: AC
  Administered 2024-01-13: 500 mg via INTRAVENOUS
  Filled 2024-01-13: qty 100

## 2024-01-13 MED ORDER — OXYCODONE HCL 5 MG/5ML PO SOLN: 5.0000 mg | Freq: Once | ORAL | Status: DC | PRN

## 2024-01-13 MED ORDER — LACTATED RINGERS IV SOLN: INTRAVENOUS | Status: DC

## 2024-01-13 MED ORDER — ACETAMINOPHEN 500 MG PO TABS
1000.0000 mg | ORAL_TABLET | ORAL | Status: DC
  Filled 2024-01-13: qty 2

## 2024-01-13 SURGICAL SUPPLY — 41 items
BAG COUNTER SPONGE SURGICOUNT (BAG) IMPLANT
BENZOIN TINCTURE PRP APPL 2/3 (GAUZE/BANDAGES/DRESSINGS) IMPLANT
BLADE SURG 15 STRL LF DISP TIS (BLADE) IMPLANT
BRIEF MESH DISP LRG (UNDERPADS AND DIAPERS) ×1 IMPLANT
COVER SURGICAL LIGHT HANDLE (MISCELLANEOUS) ×1 IMPLANT
DISSECTOR SURG LIGASURE 21 (MISCELLANEOUS) IMPLANT
DRAIN PENROSE 0.25X18 (DRAIN) IMPLANT
DRAPE LAPAROSCOPIC ABDOMINAL (DRAPES) IMPLANT
DRAPE LAPAROTOMY T 102X78X121 (DRAPES) ×1 IMPLANT
ELECT NDL BLADE 2-5/6 (NEEDLE) IMPLANT
ELECT NDL TIP 2.8 STRL (NEEDLE) ×1 IMPLANT
ELECT NEEDLE BLADE 2-5/6 (NEEDLE) IMPLANT
ELECT NEEDLE TIP 2.8 STRL (NEEDLE) ×1 IMPLANT
ELECT PENCIL ROCKER SW 15FT (MISCELLANEOUS) IMPLANT
ELECT REM PT RETURN 15FT ADLT (MISCELLANEOUS) ×1 IMPLANT
GAUZE 4X4 16PLY ~~LOC~~+RFID DBL (SPONGE) ×1 IMPLANT
GAUZE PAD ABD 8X10 STRL (GAUZE/BANDAGES/DRESSINGS) IMPLANT
GAUZE SPONGE 4X4 12PLY STRL (GAUZE/BANDAGES/DRESSINGS) IMPLANT
GLOVE BIO SURGEON STRL SZ7.5 (GLOVE) ×1 IMPLANT
GLOVE INDICATOR 8.0 STRL GRN (GLOVE) ×1 IMPLANT
GOWN STRL REUS W/ TWL XL LVL3 (GOWN DISPOSABLE) ×1 IMPLANT
KIT BASIN OR (CUSTOM PROCEDURE TRAY) ×1 IMPLANT
KIT TURNOVER KIT A (KITS) ×1 IMPLANT
LOOP VESSEL MAXI BLUE (MISCELLANEOUS) IMPLANT
NDL HYPO 22X1.5 SAFETY MO (MISCELLANEOUS) ×1 IMPLANT
NEEDLE HYPO 22X1.5 SAFETY MO (MISCELLANEOUS) ×1 IMPLANT
PACK BASIC VI WITH GOWN DISP (CUSTOM PROCEDURE TRAY) ×1 IMPLANT
PAK SCROTO (SET/KITS/TRAYS/PACK) IMPLANT
RETRACTOR RING URO 16.6X16.6 (MISCELLANEOUS) IMPLANT
RETRACTOR STAY HOOK 5MM (MISCELLANEOUS) IMPLANT
SHEARS HARMONIC 9CM CVD (BLADE) IMPLANT
SPIKE FLUID TRANSFER (MISCELLANEOUS) ×1 IMPLANT
SURGILUBE 2OZ TUBE FLIPTOP (MISCELLANEOUS) ×1 IMPLANT
SUT CHROMIC 2 0 SH (SUTURE) IMPLANT
SUT CHROMIC 3 0 SH 27 (SUTURE) IMPLANT
SUT SILK 0 30XBRD TIE 6 (SUTURE) IMPLANT
SUT SILK 2 0 SH (SUTURE) IMPLANT
SUT VIC AB 2-0 SH 27X BRD (SUTURE) IMPLANT
SUT VIC AB 2-0 UR6 27 (SUTURE) ×2 IMPLANT
SYR 20ML LL LF (SYRINGE) ×1 IMPLANT
TOWEL OR 17X26 10 PK STRL BLUE (TOWEL DISPOSABLE) ×1 IMPLANT

## 2024-01-13 NOTE — H&P (Signed)
 CC: Here today for surgery  HPI: [name] is an 58 y.o. male with history of primary sclerosing cholangitis, autoimmune hepatitis, Crohn's disease, anxiety/depression, PTSD, past C. difficile colitis, chronic pain syndrome, chronic mesenteric ischemia, basal cell carcinoma of left lower extremity, history of suicide attempt, past portal vein thrombosis, intrahepatic cholangiocarcinoma (following with medical oncology Dr. Autumn whom is seen in the office today for evaluation of perianal Crohn's + recurrent abscess.   He follows with Dr. Debby in our practice but sees me in the urgent office today.  He does have a longstanding history of Crohn's disease and has undergone prior EUA's with seton placements back in 11/19/2015 and 01/19/2019 for anal fistulas.  Ultimately, he opted to have these removed as they were quite uncomfortable.   He has had numerous recurrent abscesses in the perianal area.  He is currently undergoing systemic treatment for cholangiocarcinoma on cisplatin  and gemcitabine  with his last infusion being on Wednesday 1 week ago.   He is also on Entyvio  for Crohn's disease.  He reports that he has developed 5-hour history now of perianal pain identical to prior abscesses in the identical location where he is had prior abscesses drained.  He last had an abscess drained in our office in June of this year by [facility].  This is at the exact same spot.  He denies any fever or chills.  He wanted to come in when it was still early.   He has active drainage from his known fistula on both the right and left side of the anal area.  This recurrent abscess that he has felt today is in the left posterior position.  INTERVAL HX MRI Liver 12/07/23 1. There is interval increase in the size of pre-existing right hepatic dome lesion as well as multiple (more than 20), new lesions throughout the liver, compatible with worsening metastases. 2. Multiple other nonacute observations, as  described above.   Urgent office 12/28/23 - Puja Maczis PA-C Appeared to have a left posterior perianal abscess, recurrent, however spontaneously draining.  He was started on Cipro /Flagyl .  Returns for recheck.    Has been doing fine.  Denies any changes or acute worsening.  He was taking Cipro  Flagyl  but this caused some dizziness so he took doxycycline  which he has 5 more days have on hand at home.  He is taking this twice daily.  He denies any fever or chills.  He has had new drainage over the last week or 2 from the right side of the perianal area in addition to the left side.  He denies any new symptoms of an abscess which he has had before.  He denies any changes in health or health history since we met in the office. No new medications/allergies. He states he is ready for surgery today.     PMH: primary sclerosing cholangitis, autoimmune hepatitis, Crohn's disease, anxiety/depression, PTSD, past C. difficile colitis, chronic pain syndrome, chronic mesenteric ischemia, basal cell carcinoma of left lower extremity, history of suicide attempt, past portal vein thrombosis, intrahepatic cholangiocarcinoma (following with medical oncology Dr. Autumn   PSH: as above   Social Hx: He is here today by himself    Past Medical History:  Diagnosis Date   Allergy    Anal fistula    Anxiety    Arthritis    ? of migratory arthritis   Autoimmune hepatitis ([name]) 01/19/2013   liver function checked every 2 or 3 months sees dr avram   Avoidant-restrictive food intake  disorder (ARFID) ? 06/15/2018   Cancer of skin of neck    Cataract    Chronic mesenteric ischemia    Chronic pain syndrome 07/09/2016   Crohn's disease of small and large intestines ([name])    followed by dr lupita gessner   Dairy product intolerance    Diarrhea, functional    Grover's disease    transient acantholytic dermatosis   Heart murmur    History of alcohol abuse    History of basal cell carcinoma excision    2013 left  leg   History of Clostridium difficile    10/ 2014   History of multiple concussions    x6   last one Jan 2017 per pt--  no residual   History of substance abuse ([name])    quit 1997 per pt   History of suicide attempt    05-18-2012  overdose /  failure to thrive   Hypertension    Iron  deficiency anemia due to chronic blood loss    Major depression, recurrent, chronic    Osteopenia    Personal history of adenomatous colonic polyps 12/2010, 03/2012   12/2010 - 8 mm serrated adenoma of rectum   Portal vein thrombosis 03/21/2015   right   Primary sclerosing cholangitis ([name])    ? 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   Substance abuse ([name]) 1997   Alcohol   Vitamin A  deficiency 05/23/2018   Vitamin B6 deficiency 07/27/2018   Vitamin C deficiency 05/23/2018    Past Surgical History:  Procedure Laterality Date   ABDOMINAL AORTAGRAM N/A 03/29/2012   Procedure: ABDOMINAL EZELLA;  Surgeon: Gaile LELON New, MD;  Location: [facility] CATH LAB;  Service: Cardiovascular;  Laterality: N/A;   ADENOIDECTOMY  age 65   BIOPSY  05/31/2018   Procedure: BIOPSY;  Surgeon: Teressa Toribio SQUIBB, MD;  [facility];  Service: Endoscopy;;   CATARACT EXTRACTION W/ INTRAOCULAR LENS  IMPLANT, BILATERAL  2009   COLONOSCOPY  2001, 05/02/2003, 01/28/11   2012: Right colon Crohn's, rectal polyp   COLONOSCOPY  03/31/2012   Procedure: COLONOSCOPY;  Surgeon: Gordy CHRISTELLA Starch, MD;  Location: [facility] ENDOSCOPY;  Service: Gastroenterology;  Laterality: N/A;   COLONOSCOPY N/A 08/18/2023   Procedure: COLONOSCOPY;  Surgeon: Avram Lupita BRAVO, MD;  [facility];  Service: Gastroenterology;  Laterality: N/A;   COLONOSCOPY N/A 08/22/2023   Procedure: COLONOSCOPY;  Surgeon: Federico Rosario BROCKS, MD;  [facility];  Service: Gastroenterology;  Laterality: N/A;   COLONOSCOPY WITH PROPOFOL  N/A 05/31/2018   Procedure: COLONOSCOPY WITH PROPOFOL ;  Surgeon:  Teressa Toribio SQUIBB, MD;  [facility];  Service: Endoscopy;  Laterality: N/A;   ESOPHAGOGASTRODUODENOSCOPY  01/28/2011   Normal   ESOPHAGOGASTRODUODENOSCOPY N/A 08/18/2023   Procedure: EGD (ESOPHAGOGASTRODUODENOSCOPY);  Surgeon: Avram Lupita BRAVO, MD;  Location: [facility];  Service: Gastroenterology;  Laterality: N/A;   FOOT SURGERY Right age 6   IR IMAGING GUIDED PORT INSERTION  09/14/2023   MOHS SURGERY Left 11/2013   left ankle parakerotosis    PERCUTANEOUS LIVER BIOPSY  2007 and 2008   PILONIDAL CYST EXCISION  age 35   PLACEMENT OF SETON N/A 11/06/2015   Procedure: PLACEMENT OF SETON;  Surgeon: Bernarda Ned, MD;  Location: [facility];  Service: General;  Laterality: N/A;   PLACEMENT OF SETON  07/2018   at [facility]   RECTAL EXAM UNDER ANESTHESIA N/A 01/18/2019   Procedure: ANAL EXAM  UNDER ANESTHESIA,  INCISION AND DRAINAGE, SETON PLACEMENT;  Surgeon: Debby Hila, MD;  Location: [facility];  Service: General;  Laterality: N/A;   UPPER GASTROINTESTINAL ENDOSCOPY      Family History  Problem Relation Age of Onset   Heart disease Father    Thyroid  cancer Father    Allergies Father    Clotting disorder Father    Breast cancer Mother    Stomach cancer Mother    Colon cancer Neg Hx    Esophageal cancer Neg Hx    Rectal cancer Neg Hx     Social:  reports that he quit smoking about 26 years ago. His smoking use included cigarettes. He started smoking about 41 years ago. He has a 15 pack-year smoking history. He has never used smokeless tobacco. He reports that he does not currently use drugs after having used the following drugs: Cocaine and Marijuana. He reports that he does not drink alcohol.  Allergies:  Allergies  Allergen Reactions   Tape Itching, Rash and Other (See Comments)    Please use paper tape   Asacol  [Mesalamine ] Diarrhea and Other (See Comments)    Abd pain, too   Azathioprine Diarrhea and Other (See Comments)    Abd  pain, too   Gluten Meal Diarrhea   Metoprolol  Tartrate Nausea Only and Other (See Comments)    Stomach upset   Mycophenolate Mofetil Diarrhea and Other (See Comments)    Abd pain, too   Other Diarrhea and Other (See Comments)    NUTS   Ciprofloxacin  Other (See Comments)    Dizziness and upset stomach   Keflex [Cephalexin] Other (See Comments)    Dizziness and upset stomach   Abilify  [Aripiprazole ] Diarrhea   Amoxicillin  Other (See Comments)    Lots of gas and dizziness    Medications: I have reviewed the patient's current medications.  No results found. However, due to the size of the patient record, not all encounters were searched. Please check Results Review for a complete set of results.  No results found.   PE Blood pressure 130/82, pulse 91, temperature 97.7 F (36.5 C), temperature source Oral, resp. rate 18, height 6' 1.5 (1.867 m), weight 80.3 kg, SpO2 97%. Constitutional: NAD; conversant Eyes: Moist conjunctiva; no lid lag; anicteric Lungs: Normal respiratory effort CV: RRR Psychiatric: Appropriate affect  No results found. However, due to the size of the patient record, not all encounters were searched. Please check Results Review for a complete set of results.  No results found.  A/P: [name] is an 58 y.o. male with hx of primary sclerosing cholangitis, autoimmune hepatitis, Crohn's disease, anxiety/depression, PTSD, past C. difficile colitis, chronic pain syndrome, chronic mesenteric ischemia, basal cell carcinoma of left lower extremity, history of suicide attempt, past portal vein thrombosis, intrahepatic cholangiocarcinoma (following with medical oncology Dr. Autumn) whom is seen in the office today for evaluation of perianal Crohn's + recurrent abscess here for evaluation of left posterior perirectal abscess   - We discussed the anatomy and physiology of the anorectal region and pathophysiology of perirectal abscess - We have reviewed options  going forward including further observation vs surgery -surgical treatment of transsphincteric anal fistulous with placement of draining seton; anorectal exam under anesthesia   - The planned procedure, material risks (including, but not limited to, pain, bleeding, infection, scarring, rare cases of pelvic sepsis, need for additional procedures, pneumonia, heart attack, stroke and very rarely death) benefits and alternatives were discussed. - The patient's questions  were answered to his satisfaction, he voiced understanding and wishes to proceed with placement of draining setons    Lonni Pizza, MD [facility], [facility]

## 2024-01-13 NOTE — Transfer of Care (Signed)
 Immediate Anesthesia Transfer of Care Note  Patient: [name]  Procedure(s) Performed: ANAL FISTULOTOMY PLACEMENT, SETON EXAM UNDER ANESTHESIA, RECTUM  Patient Location: PACU  Anesthesia Type:General  Level of Consciousness: awake and alert   Airway & Oxygen Therapy: Patient Spontanous Breathing and Patient connected to face mask oxygen  Post-op Assessment: Report given to RN and Post -op Vital signs reviewed and stable  Post vital signs: Reviewed and stable  Last Vitals:  Vitals Value Taken Time  BP 139/74 01/13/24 10:25  Temp    Pulse 88 01/13/24 10:28  Resp 11 01/13/24 10:28  SpO2 100 % 01/13/24 10:28  Vitals shown include unfiled device data.  Last Pain:  Vitals:   01/13/24 0704  TempSrc: Oral         Complications: No notable events documented.

## 2024-01-13 NOTE — Anesthesia Preprocedure Evaluation (Signed)
 Anesthesia Evaluation  Patient identified by MRN, date of birth, ID band Patient awake    Reviewed: Allergy & Precautions, NPO status , Patient's Chart, lab work & pertinent test results  History of Anesthesia Complications (+) Family history of anesthesia reaction  Airway Mallampati: I  TM Distance: >3 FB Neck ROM: Full    Dental  (+) Dental Advisory Given, Teeth Intact   Pulmonary neg shortness of breath, neg sleep apnea, neg COPD, neg recent URI, former smoker   breath sounds clear to auscultation       Cardiovascular hypertension, (-) angina (-) Past MI, (-) Cardiac Stents and (-) CABG (-) dysrhythmias  Rhythm:Regular Rate:Normal  HLD  TTE 07/03/2023: IMPRESSIONS    1. Left ventricular ejection fraction, by estimation, is 70 to 75%. The  left ventricle has hyperdynamic function. The left ventricle has no  regional wall motion abnormalities. There is mild asymmetric left  ventricular hypertrophy of the basal-septal  segment. Left ventricular diastolic parameters are consistent with Grade I  diastolic dysfunction (impaired relaxation).   2. Right ventricular systolic function is normal. The right ventricular  size is normal. Tricuspid regurgitation signal is inadequate for assessing  PA pressure.   3. The mitral valve is normal in structure. No evidence of mitral valve  regurgitation. No evidence of mitral stenosis.   4. The aortic valve was not well visualized. There is mild calcification  of the aortic valve. There is mild thickening of the aortic valve. Aortic  valve regurgitation is not visualized. No aortic stenosis is present.   5. The inferior vena cava is normal in size with greater than 50%  respiratory variability, suggesting right atrial pressure of 3 mmHg.     Neuro/Psych neg Seizures PSYCHIATRIC DISORDERS (h/o suicide attempt, PTSD) Anxiety Depression    Chronic pain syndrome    GI/Hepatic ,neg GERD  ,,(+)  Cirrhosis     (-) substance abuse (h/o alcohol use in remission)  H/o Autoimmune hepatitis, h/o portal vein thrombosis  Crohn's disease, primary sclerosing cholangitis   Endo/Other  diabetes, Type 2    Renal/GU negative Renal ROS     Musculoskeletal  (+) Arthritis ,  Osteopenia    Abdominal   Peds  Hematology  (+) Blood dyscrasia (thrombocytopenia), anemia Patient reports he will accept blood products if needed.  Lab Results      Component                Value               Date                      WBC                      13.8 (H)            08/21/2023                HGB                      13.7                08/21/2023                HCT                      40.1  08/21/2023                MCV                      92.2                08/21/2023                PLT                      123 (L)             08/21/2023              Anesthesia Other Findings Past Medical History: No date: Allergy No date: Anal fistula No date: Anxiety No date: Arthritis     Comment:  ? of migratory arthritis 01/19/2013: Autoimmune hepatitis ([name])     Comment:  liver function checked every 2 or 3 months sees dr               avram 06/15/2018: Avoidant-restrictive food intake disorder (ARFID) ? No date: Cancer of skin of neck No date: Cataract No date: Chronic mesenteric ischemia 07/09/2016: Chronic pain syndrome No date: Crohn's disease of small and large intestines ([name])     Comment:  followed by dr lupita gessner No date: Dairy product intolerance No date: Diarrhea, functional No date: Grover's disease     Comment:  transient acantholytic dermatosis No date: Heart murmur No date: History of alcohol abuse No date: History of basal cell carcinoma excision     Comment:  2013 left leg No date: History of Clostridium difficile     Comment:  10/ 2014 No date: History of multiple concussions     Comment:  x6   last one Jan 2017 per pt--  no residual No date: History  of substance abuse ([name])     Comment:  quit 1997 per pt No date: History of suicide attempt     Comment:  05-18-2012  overdose /  failure to thrive No date: Hypertension No date: Iron  deficiency anemia due to chronic blood loss No date: Major depression, recurrent, chronic No date: Osteopenia 12/2010, 03/2012: Personal history of adenomatous colonic polyps     Comment:  12/2010 - 8 mm serrated adenoma of rectum 03/21/2015: Portal vein thrombosis     Comment:  right No date: Primary sclerosing cholangitis ([name])     Comment:  ? hepatitis overlap - liver bx x 2 and MRCP No date: Seasonal allergies 03/15/2022: Steroid-induced diabetes 1997: Substance abuse ([name])     Comment:  Alcohol 05/23/2018: Vitamin A  deficiency 07/27/2018: Vitamin B6 deficiency 05/23/2018: Vitamin C deficiency]  Reproductive/Obstetrics                              Anesthesia Physical Anesthesia Plan  ASA: 3  Anesthesia Plan: General   Post-op Pain Management: Ofirmev  IV (intra-op)*   Induction: Intravenous  PONV Risk Score and Plan: 2 and Treatment may vary due to age or medical condition, Midazolam , Dexamethasone  and Ondansetron   Airway Management Planned: Oral ETT  Additional Equipment: None  Intra-op Plan:   Post-operative Plan: Extubation in OR  Informed Consent: I have reviewed the patients History and Physical, chart, labs and discussed the procedure including the risks, benefits and alternatives for the proposed anesthesia with the patient or authorized representative who has indicated his/her understanding and acceptance.  Dental advisory given  Plan Discussed with: CRNA and Anesthesiologist  Anesthesia Plan Comments: (Discussed risks of anesthesia with patient, including PONV, sore throat, lip/dental/eye damage. Rare risks discussed as well, such as cardiorespiratory and neurological sequelae, and allergic reactions. Discussed the role of CRNA in patient's  perioperative care. Patient understands.)        Anesthesia Quick Evaluation

## 2024-01-13 NOTE — Discharge Instructions (Addendum)
 ANORECTAL SURGERY: POST OP INSTRUCTIONS  You had an anal fistula on the left controlled with a seton as planned. We identified a large right sided perirectal abscess without evident communication to he anal canal but did control it with a 'penrose' drain. All drains around the anus are meant to stay in place and are safe to get wet when bathing. Some bleeding for the first 1-2 weeks is completely normal. Ok to wipe normally. Many patients find it helpful to rinse off in the shower or with a shower want attached to their commode for hygiene purposes Given that we drained and abscess, we are prescribing a course of antibiotics (augmentin ) to be taken twice daily for the next week.   DIET: Follow a light bland diet the first 24 hours after arrival home, such as soup, liquids, crackers, etc.  Be sure to include lots of fluids daily.  Avoid fast food or heavy meals as your are more likely to get nauseated.  Eat a low fat diet the next few days after surgery.   Some bleeding with bowel movements is expected for the first couple of days but this should stop in between bowel movements  Take your usually prescribed home medications unless otherwise directed. No foreign bodies per rectum for the next 3 months (enemas, etc)  PAIN CONTROL: It is helpful to take an over-the-counter pain medication regularly for the first few days/weeks.  Choose from the following that works best for you: Ibuprofen (Advil, etc) Three 200mg  tabs every 6 hours as needed. Acetaminophen  (Tylenol , etc) 500-650mg  every 6 hours as needed NOTE: You may take both of these medications together - most patients find it most helpful when alternating between the two (i.e. Ibuprofen at 6am, tylenol  at 9am, ibuprofen at 12pm ..SABRA) A  prescription for pain medication may have been prescribed for you at discharge.  Take your pain medication as prescribed.  If you are having problems/concerns with the prescription medicine, please call us  for  further advice.  Avoid getting constipated.  Between the surgery and the pain medications, it is common to experience some constipation.  Increasing fluid intake (64oz of water per day) and taking a fiber supplement (such as Metamucil, Citrucel, FiberCon) 1-2 times a day regularly will usually help prevent this problem from occurring.  Take Miralax  (over the counter) 1-2x/day while taking a narcotic pain medication. If no bowel movement after 48hours, you may additionally take a laxative like a bottle of Milk of Magnesia which can be purchased over the counter. Avoid enemas.   Watch out for diarrhea.  If you have many loose bowel movements, simplify your diet to bland foods.  Stop any stool softeners and decrease your fiber supplement. If this worsens or does not improve, please call us .  Wash / shower every day.  If you were discharged with a dressing, you may remove this the day after your surgery. You may shower normally, getting soap/water on your wound, particularly after bowel movements.  Soaking in a warm bath filled a couple inches (Sitz bath) is a great way to clean the area after a bowel movement and many patients find it is a way to soothe the area.  ACTIVITIES as tolerated:   You may resume regular (light) daily activities beginning the next day--such as daily self-care, walking, climbing stairs--gradually increasing activities as tolerated.  If you can walk 30 minutes without difficulty, it is safe to try more intense activity such as jogging, treadmill, bicycling, low-impact aerobics, etc. Refrain from  any heavy lifting or straining for the first 2 weeks after your procedure, particularly if your surgery was for hemorrhoids. Avoid activities that make your pain worse You may drive when you are no longer taking prescription pain medication, you can comfortably wear a seatbelt, and you can safely maneuver your car and apply brakes.  FOLLOW UP in our office Please call [facility] at [phone]  [phone] to set up an appointment to see your surgeon in the office for a follow-up appointment approximately 2 weeks after your surgery. Make sure that you call for this appointment the day you arrive home to insure a convenient appointment time.  9. If you have disability or family leave forms that need to be completed, you may have them completed by your primary care physician's office; for return to work instructions, please ask our office staff and they will be happy to assist you in obtaining this documentation   When to call us  [phone]: Poor pain control Reactions / problems with new medications (rash/itching, etc)  Fever over 101.5 F (38.5 C) Inability to urinate Nausea/vomiting Worsening swelling or bruising Continued bleeding from incision. Increased pain, redness, or drainage from the incision  The clinic staff is available to answer your questions during regular business hours (8:30am-5pm).  Please don't hesitate to call and ask to speak to one of our nurses for clinical concerns.   A surgeon from [facility] is always on call at the hospitals   If you have a medical emergency, go to the nearest emergency room or call 911.   [facility] [address], [address], [place], [place]  [address] MAIN: [phone] FAX: [phone] [email or web address]

## 2024-01-13 NOTE — Op Note (Addendum)
 01/13/2024  10:18 AM  PATIENT:  [name]  58 y.o. male  Patient Care Team: [name] [name] Redbird, [name] as PCP - General (Family Medicine) [name] Lupita BRAVO, [name] as Attending Physician (Gastroenterology) [name] [name] BRAVO, [name] (Inactive) as Attending Physician (Vascular Surgery) [name] Evalene CROME, RN as Oncology Nurse Navigator [name] as Consulting Physician (Hematology and Oncology)  PRE-OPERATIVE DIAGNOSIS: Perianal Crohn's disease, anal fistula  POST-OPERATIVE DIAGNOSIS: Perianal Crohn's disease with transsphincteric anal fistula, additionally perirectal abscess  PROCEDURE:   1.  Surgical treatment of left-sided transsphincteric anal fistula with placement of draining seton 2.  Incision and drainage of right sided perirectal abscess 3.  Anorectal exam under anesthesia  SURGEON:  Surgeon(s): [name], [name] HERO, [name]  ASSISTANT: [name] Brash, [name]   ANESTHESIA:   local and general  SPECIMEN:   None  DISPOSITION OF SPECIMEN:  N/A  COUNTS:  Sponge, needle, and instrument counts were reported correct x2 at conclusion.  EBL: 10 mL  PLAN OF CARE: Discharge to home after PACU  PATIENT DISPOSITION:  PACU - hemodynamically stable.  OR FINDINGS:  1.  External opening of evident anal fistula in the left posterior position.  Internal opening identified near the posterior midline approximately 3 cm above the anal verge-very high transsphincteric anal fistula. 2.  Fluctuant large perirectal abscess on the contralateral buttock-right side.  This contained approximately 5 cc of pus.  No evident fistula was identified from this 1 but given the size, counterincision created and Penrose placed to facilitate ongoing drainage.  DESCRIPTION: The patient was seen in the pre-op holding area. The risks, benefits, complications, treatment options, and expected outcomes were previously discussed with the patient. The patient agreed with the proposed plan and has signed the informed  consent form. The patient was brought to the operating room by the surgical team, identified as [name], and the procedure verified. SCD's were applied. General anesthesia was induced without difficulty.  He was then positioned in lithotomy with Allen stirrups.  Pressure points were evaluated and padded. The patient was then prepped and draped in usual sterile fashion. A time out was completed and the above information confirmed and need for preoperative antibiotics.  A perianal block was then created using a dilute mixture of 0.25% Marcaine  with epinephrine .  After ascertaining that an appropriate level of anesthesia had been achieved, a well lubricated digital rectal exam was performed. This demonstrated no palpable masses or other evident abnormalities.  A Hill-Ferguson anoscope was into the anal canal and circumferential inspection demonstrated mild intrinsic scarring of the proximal anal canal.  Some granulation tissue evident in the distal rectum near the posterior midline.  Externally, on the left side there is an evident external opening that is patent and draining.  A semirigid fistula probe was insinuated into this tract and we were able to identify a very high transsphincteric/supra sphincteric anal fistula with the internal opening in the posterior midline approximately 3 to 4 cm above the anal verge.  This was able to be exchanged and therefore controlled using a blue vessel loop seton which was secured to itself using 2-0 silk suture.  Externally, there is a large fluctuant indurated abscess on the right buttock.  Over the area of maximal fluctuance, a cruciate type incision is created and we immediately encountered purulence.  This is all drained.  All loculations were broken.  Given the size of this abscess cavity, we opted to proceed with a counterincision and 1/4 inch Penrose was then placed  to control this.  With probing of the wound, not able to prove there is any evident  communication of the anal canal through this abscess cavity.  The Penrose is secured to itself with 0 silk suture.  All wounds were irrigated.  Hemostasis is achieved electrocautery.  Local anesthetic is infiltrated around the I&D site.  All sponge, needle, and instrument counts were reported correct.  No other areas of fluctuance or concern are noted.  Of note, there is no significant external erythema either.  That said, we will plan for a 7-day course of oral antibiotics as he is currently being treated for cholangiocarcinoma.  A dressing consisting of 4 x 4's, ABD, and mesh underwear was placed.  He was subsequently taken out of lithotomy position, awakened from anesthesia, extubated, transported to the recovery room in satisfactory condition.  DISPOSITION: PACU in satisfactory condition.  I updated his brother, [name].

## 2024-01-13 NOTE — Anesthesia Postprocedure Evaluation (Signed)
 Anesthesia Post Note  Patient: [name]  Procedure(s) Performed: ANAL FISTULOTOMY PLACEMENT, SETON EXAM UNDER ANESTHESIA, RECTUM     Patient location during evaluation: PACU Anesthesia Type: General Level of consciousness: awake and alert Pain management: pain level controlled Vital Signs Assessment: post-procedure vital signs reviewed and stable Respiratory status: spontaneous breathing, nonlabored ventilation, respiratory function stable and patient connected to nasal cannula oxygen Cardiovascular status: blood pressure returned to baseline and stable Postop Assessment: no apparent nausea or vomiting Anesthetic complications: no   No notable events documented.  Last Vitals:  Vitals:   01/13/24 1058 01/13/24 1100  BP: 123/76 124/80  Pulse: 75 72  Resp: 18 15  Temp: (!) 36.3 C   SpO2: 93% 94%    Last Pain:  Vitals:   01/13/24 1100  TempSrc:   PainSc: 0-No pain                 Rome Ade

## 2024-01-13 NOTE — Addendum Note (Signed)
 Addendum  created 01/13/24 1351 by Dartha Meckel, CRNA   Flowsheet accepted

## 2024-01-13 NOTE — Anesthesia Procedure Notes (Signed)
 Procedure Name: Intubation Date/Time: 01/13/2024 9:11 AM  Performed by: Dartha Meckel, CRNAPre-anesthesia Checklist: Patient identified, Emergency Drugs available, Suction available and Patient being monitored Patient Re-evaluated:Patient Re-evaluated prior to induction Oxygen Delivery Method: Circle system utilized Preoxygenation: Pre-oxygenation with 100% oxygen Induction Type: IV induction Ventilation: Mask ventilation without difficulty Laryngoscope Size: Mac and 4 Grade View: Grade I Tube type: Oral Tube size: 7.5 mm Number of attempts: 1 Airway Equipment and Method: Stylet and Oral airway Placement Confirmation: ETT inserted through vocal cords under direct vision, positive ETCO2 and breath sounds checked- equal and bilateral Secured at: 22 cm Tube secured with: Tape Dental Injury: Teeth and Oropharynx as per pre-operative assessment

## 2024-01-14 ENCOUNTER — Encounter (HOSPITAL_COMMUNITY): Payer: Self-pay | Admitting: Surgery

## 2024-01-16 ENCOUNTER — Telehealth: Payer: Self-pay

## 2024-01-16 NOTE — Telephone Encounter (Signed)
 Follow up with patient to see how he was feeling and to confirm all of his appointments for this Thursday. He reported to feeling better and confirmed that he will be at [facility] this Thursday for all his appointments. He reported that his pain was much improved. Dr. Autumn updated.

## 2024-01-17 DIAGNOSIS — M9903 Segmental and somatic dysfunction of lumbar region: Secondary | ICD-10-CM | POA: Diagnosis not present

## 2024-01-18 ENCOUNTER — Encounter: Payer: Self-pay | Admitting: Oncology

## 2024-01-18 ENCOUNTER — Other Ambulatory Visit: Payer: Self-pay

## 2024-01-18 DIAGNOSIS — K754 Autoimmune hepatitis: Secondary | ICD-10-CM

## 2024-01-18 DIAGNOSIS — K50813 Crohn's disease of both small and large intestine with fistula: Secondary | ICD-10-CM

## 2024-01-18 DIAGNOSIS — C221 Intrahepatic bile duct carcinoma: Secondary | ICD-10-CM

## 2024-01-18 DIAGNOSIS — C799 Secondary malignant neoplasm of unspecified site: Secondary | ICD-10-CM

## 2024-01-18 DIAGNOSIS — D72829 Elevated white blood cell count, unspecified: Secondary | ICD-10-CM

## 2024-01-19 ENCOUNTER — Other Ambulatory Visit: Payer: Self-pay

## 2024-01-19 ENCOUNTER — Inpatient Hospital Stay: Admitting: Oncology

## 2024-01-19 ENCOUNTER — Inpatient Hospital Stay: Attending: Oncology

## 2024-01-19 ENCOUNTER — Inpatient Hospital Stay

## 2024-01-19 VITALS — BP 135/88 | HR 84 | Temp 97.5°F | Resp 16 | Wt 177.8 lb

## 2024-01-19 DIAGNOSIS — K754 Autoimmune hepatitis: Secondary | ICD-10-CM

## 2024-01-19 DIAGNOSIS — D72829 Elevated white blood cell count, unspecified: Secondary | ICD-10-CM

## 2024-01-19 DIAGNOSIS — Z79620 Long term (current) use of immunosuppressive biologic: Secondary | ICD-10-CM | POA: Insufficient documentation

## 2024-01-19 DIAGNOSIS — Z95828 Presence of other vascular implants and grafts: Secondary | ICD-10-CM

## 2024-01-19 DIAGNOSIS — Z5111 Encounter for antineoplastic chemotherapy: Secondary | ICD-10-CM | POA: Diagnosis not present

## 2024-01-19 DIAGNOSIS — C221 Intrahepatic bile duct carcinoma: Secondary | ICD-10-CM | POA: Insufficient documentation

## 2024-01-19 DIAGNOSIS — C799 Secondary malignant neoplasm of unspecified site: Secondary | ICD-10-CM

## 2024-01-19 DIAGNOSIS — K50813 Crohn's disease of both small and large intestine with fistula: Secondary | ICD-10-CM

## 2024-01-19 LAB — CMP (CANCER CENTER ONLY)
ALT: 64 U/L — ABNORMAL HIGH (ref 0–44)
AST: 55 U/L — ABNORMAL HIGH (ref 15–41)
Albumin: 3.4 g/dL — ABNORMAL LOW (ref 3.5–5.0)
Alkaline Phosphatase: 155 U/L — ABNORMAL HIGH (ref 38–126)
Anion gap: 8 (ref 5–15)
BUN: 28 mg/dL — ABNORMAL HIGH (ref 6–20)
CO2: 30 mmol/L (ref 22–32)
Calcium: 9 mg/dL (ref 8.9–10.3)
Chloride: 103 mmol/L (ref 98–111)
Creatinine: 1.14 mg/dL (ref 0.61–1.24)
GFR, Estimated: >60 mL/min (ref >60)
Glucose, Bld: 88 mg/dL (ref 70–99)
Potassium: 3.8 mmol/L (ref 3.5–5.1)
Sodium: 141 mmol/L (ref 135–145)
Total Bilirubin: 0.5 mg/dL (ref 0.0–1.2)
Total Protein: 6.1 g/dL — ABNORMAL LOW (ref 6.5–8.1)

## 2024-01-19 LAB — CBC WITH DIFFERENTIAL (CANCER CENTER ONLY)
Abs Immature Granulocytes: 0.53 K/uL — ABNORMAL HIGH (ref 0.00–0.07)
Basophils Absolute: 0.1 K/uL (ref 0.0–0.1)
Basophils Relative: 1 %
Eosinophils Absolute: 0.1 K/uL (ref 0.0–0.5)
Eosinophils Relative: 1 %
HCT: 40 % (ref 39.0–52.0)
Hemoglobin: 12.8 g/dL — ABNORMAL LOW (ref 13.0–17.0)
Immature Granulocytes: 4 %
Lymphocytes Relative: 11 %
Lymphs Abs: 1.6 K/uL (ref 0.7–4.0)
MCH: 30 pg (ref 26.0–34.0)
MCHC: 32 g/dL (ref 30.0–36.0)
MCV: 93.7 fL (ref 80.0–100.0)
Monocytes Absolute: 1.1 K/uL — ABNORMAL HIGH (ref 0.1–1.0)
Monocytes Relative: 8 %
Neutro Abs: 11.4 K/uL — ABNORMAL HIGH (ref 1.7–7.7)
Neutrophils Relative %: 75 %
Platelet Count: 254 K/uL (ref 150–400)
RBC: 4.27 MIL/uL (ref 4.22–5.81)
RDW: 20.1 % — ABNORMAL HIGH (ref 11.5–15.5)
WBC Count: 14.8 K/uL — ABNORMAL HIGH (ref 4.0–10.5)
nRBC: 0 % (ref 0.0–0.2)

## 2024-01-19 MED ORDER — FLUOROURACIL CHEMO INJECTION 2.5 GM/50ML
400.0000 mg/m2 | Freq: Once | INTRAVENOUS | Status: AC
  Administered 2024-01-19: 800 mg via INTRAVENOUS
  Filled 2024-01-19: qty 16

## 2024-01-19 MED ORDER — OXALIPLATIN CHEMO INJECTION 100 MG/20ML
85.0000 mg/m2 | Freq: Once | INTRAVENOUS | Status: AC
  Administered 2024-01-19: 170 mg via INTRAVENOUS
  Filled 2024-01-19: qty 34

## 2024-01-19 MED ORDER — PALONOSETRON HCL INJECTION 0.25 MG/5ML
0.2500 mg | Freq: Once | INTRAVENOUS | Status: AC
  Administered 2024-01-19: 0.25 mg via INTRAVENOUS
  Filled 2024-01-19: qty 5

## 2024-01-19 MED ORDER — LEUCOVORIN CALCIUM INJECTION 350 MG
400.0000 mg/m2 | Freq: Once | INTRAVENOUS | Status: AC
  Administered 2024-01-19: 808 mg via INTRAVENOUS
  Filled 2024-01-19: qty 40.4

## 2024-01-19 MED ORDER — SODIUM CHLORIDE 0.9 % IV SOLN
2400.0000 mg/m2 | INTRAVENOUS | Status: DC
  Administered 2024-01-19: 5000 mg via INTRAVENOUS
  Filled 2024-01-19: qty 100

## 2024-01-19 MED ORDER — DEXAMETHASONE SOD PHOSPHATE PF 10 MG/ML IJ SOLN
10.0000 mg | Freq: Once | INTRAMUSCULAR | Status: AC
  Administered 2024-01-19: 10 mg via INTRAVENOUS

## 2024-01-19 MED ORDER — DEXTROSE 5 % IV SOLN: INTRAVENOUS | Status: DC

## 2024-01-19 NOTE — Patient Instructions (Signed)
 [facility] - A DEPT OF [facility]  Discharge Instructions: Thank you for choosing [facility] to provide your oncology and hematology care.   If you have a lab appointment with the Cancer Center, please go directly to the Cancer Center and check in at the registration area.   Wear comfortable clothing and clothing appropriate for easy access to any Portacath or PICC line.   We strive to give you quality time with your provider. You may need to reschedule your appointment if you arrive late (15 or more minutes).  Arriving late affects you and other patients whose appointments are after yours.  Also, if you miss three or more appointments without notifying the office, you may be dismissed from the clinic at the provider's discretion.      For prescription refill requests, have your pharmacy contact our office and allow 72 hours for refills to be completed.    Today you received the following chemotherapy and/or immunotherapy agents: fluorouracil  (ADRUCIL ), leucovorin , oxaliplatin  (ELOXATIN )   To help prevent nausea and vomiting after your treatment, we encourage you to take your nausea medication as directed.  BELOW ARE SYMPTOMS THAT SHOULD BE REPORTED IMMEDIATELY: *FEVER GREATER THAN 100.4 F (38 C) OR HIGHER *CHILLS OR SWEATING *NAUSEA AND VOMITING THAT IS NOT CONTROLLED WITH YOUR NAUSEA MEDICATION *UNUSUAL SHORTNESS OF BREATH *UNUSUAL BRUISING OR BLEEDING *URINARY PROBLEMS (pain or burning when urinating, or frequent urination) *BOWEL PROBLEMS (unusual diarrhea, constipation, pain near the anus) TENDERNESS IN MOUTH AND THROAT WITH OR WITHOUT PRESENCE OF ULCERS (sore throat, sores in mouth, or a toothache) UNUSUAL RASH, SWELLING OR PAIN  UNUSUAL VAGINAL DISCHARGE OR ITCHING   Items with * indicate a potential emergency and should be followed up as soon as possible or go to the Emergency Department if any problems should occur.  Please show  the CHEMOTHERAPY ALERT CARD or IMMUNOTHERAPY ALERT CARD at check-in to the Emergency Department and triage nurse.  Should you have questions after your visit or need to cancel or reschedule your appointment, please contact [facility] - A DEPT OF [name]  Dept: [phone]  and follow the prompts.  Office hours are 8:00 a.m. to 4:30 p.m. Monday - Friday. Please note that voicemails left after 4:00 p.m. may not be returned until the following business day.  We are closed weekends and major holidays. You have access to a nurse at all times for urgent questions. Please call the main number to the clinic Dept: [phone] and follow the prompts.   For any non-urgent questions, you may also contact your provider using MyChart. We now offer e-Visits for anyone 73 and older to request care online for non-urgent symptoms. For details visit [email or web address].   Also download the MyChart app! Go to the app store, search MyChart, open the app, select [facility], and log in with your MyChart username and password.

## 2024-01-20 DIAGNOSIS — F330 Major depressive disorder, recurrent, mild: Secondary | ICD-10-CM | POA: Diagnosis not present

## 2024-01-20 DIAGNOSIS — F411 Generalized anxiety disorder: Secondary | ICD-10-CM | POA: Diagnosis not present

## 2024-01-21 ENCOUNTER — Inpatient Hospital Stay

## 2024-01-21 VITALS — BP 161/94 | HR 88 | Temp 97.6°F | Resp 18

## 2024-01-21 DIAGNOSIS — C221 Intrahepatic bile duct carcinoma: Secondary | ICD-10-CM | POA: Diagnosis not present

## 2024-01-21 DIAGNOSIS — Z5111 Encounter for antineoplastic chemotherapy: Secondary | ICD-10-CM | POA: Diagnosis not present

## 2024-01-21 DIAGNOSIS — Z79620 Long term (current) use of immunosuppressive biologic: Secondary | ICD-10-CM | POA: Diagnosis not present

## 2024-01-21 DIAGNOSIS — Z95828 Presence of other vascular implants and grafts: Secondary | ICD-10-CM

## 2024-01-21 MED ORDER — ALTEPLASE 2 MG IJ SOLR
2.0000 mg | Freq: Once | INTRAMUSCULAR | Status: AC
  Administered 2024-01-21: 2 mg
  Filled 2024-01-21: qty 2

## 2024-01-21 MED ORDER — SODIUM CHLORIDE 0.9% FLUSH: 10.0000 mL | Freq: Once | INTRAVENOUS | Status: DC

## 2024-01-21 MED ORDER — SODIUM CHLORIDE 0.9% FLUSH
10.0000 mL | INTRAVENOUS | Status: DC | PRN
  Administered 2024-01-21: 10 mL

## 2024-01-26 ENCOUNTER — Other Ambulatory Visit: Payer: Self-pay

## 2024-01-31 ENCOUNTER — Encounter: Payer: Self-pay | Admitting: Oncology

## 2024-01-31 DIAGNOSIS — M9903 Segmental and somatic dysfunction of lumbar region: Secondary | ICD-10-CM | POA: Diagnosis not present

## 2024-02-01 ENCOUNTER — Inpatient Hospital Stay

## 2024-02-01 ENCOUNTER — Inpatient Hospital Stay: Attending: Oncology | Admitting: Oncology

## 2024-02-01 ENCOUNTER — Inpatient Hospital Stay: Attending: Oncology

## 2024-02-01 ENCOUNTER — Other Ambulatory Visit: Payer: Self-pay

## 2024-02-01 ENCOUNTER — Encounter: Payer: Self-pay | Admitting: Oncology

## 2024-02-01 VITALS — BP 149/80 | HR 97 | Temp 97.7°F | Resp 16 | Wt 182.5 lb

## 2024-02-01 DIAGNOSIS — Z79899 Other long term (current) drug therapy: Secondary | ICD-10-CM | POA: Diagnosis not present

## 2024-02-01 DIAGNOSIS — Z5111 Encounter for antineoplastic chemotherapy: Secondary | ICD-10-CM | POA: Diagnosis present

## 2024-02-01 DIAGNOSIS — C221 Intrahepatic bile duct carcinoma: Secondary | ICD-10-CM | POA: Insufficient documentation

## 2024-02-01 LAB — CBC WITH DIFFERENTIAL (CANCER CENTER ONLY)
Abs Immature Granulocytes: 0.11 K/uL — ABNORMAL HIGH (ref 0.00–0.07)
Basophils Absolute: 0 K/uL (ref 0.0–0.1)
Basophils Relative: 0 %
Eosinophils Absolute: 0 K/uL (ref 0.0–0.5)
Eosinophils Relative: 0 %
HCT: 36.6 % — ABNORMAL LOW (ref 39.0–52.0)
Hemoglobin: 11.9 g/dL — ABNORMAL LOW (ref 13.0–17.0)
Immature Granulocytes: 1 %
Lymphocytes Relative: 8 %
Lymphs Abs: 0.9 K/uL (ref 0.7–4.0)
MCH: 31.1 pg (ref 26.0–34.0)
MCHC: 32.5 g/dL (ref 30.0–36.0)
MCV: 95.6 fL (ref 80.0–100.0)
Monocytes Absolute: 0.8 K/uL (ref 0.1–1.0)
Monocytes Relative: 6 %
Neutro Abs: 10 K/uL — ABNORMAL HIGH (ref 1.7–7.7)
Neutrophils Relative %: 85 %
Platelet Count: 121 K/uL — ABNORMAL LOW (ref 150–400)
RBC: 3.83 MIL/uL — ABNORMAL LOW (ref 4.22–5.81)
RDW: 19.3 % — ABNORMAL HIGH (ref 11.5–15.5)
WBC Count: 11.8 K/uL — ABNORMAL HIGH (ref 4.0–10.5)
nRBC: 0 % (ref 0.0–0.2)

## 2024-02-01 LAB — CMP (CANCER CENTER ONLY)
ALT: 67 U/L — ABNORMAL HIGH (ref 0–44)
AST: 46 U/L — ABNORMAL HIGH (ref 15–41)
Albumin: 3.4 g/dL — ABNORMAL LOW (ref 3.5–5.0)
Alkaline Phosphatase: 143 U/L — ABNORMAL HIGH (ref 38–126)
Anion gap: 9 (ref 5–15)
BUN: 21 mg/dL — ABNORMAL HIGH (ref 6–20)
CO2: 27 mmol/L (ref 22–32)
Calcium: 8.6 mg/dL — ABNORMAL LOW (ref 8.9–10.3)
Chloride: 104 mmol/L (ref 98–111)
Creatinine: 1.05 mg/dL (ref 0.61–1.24)
GFR, Estimated: >60 mL/min (ref >60)
Glucose, Bld: 169 mg/dL — ABNORMAL HIGH (ref 70–99)
Potassium: 3.7 mmol/L (ref 3.5–5.1)
Sodium: 139 mmol/L (ref 135–145)
Total Bilirubin: 0.6 mg/dL (ref 0.0–1.2)
Total Protein: 5.8 g/dL — ABNORMAL LOW (ref 6.5–8.1)

## 2024-02-01 MED ORDER — PALONOSETRON HCL INJECTION 0.25 MG/5ML
0.2500 mg | Freq: Once | INTRAVENOUS | Status: AC
  Administered 2024-02-01: 0.25 mg via INTRAVENOUS
  Filled 2024-02-01: qty 5

## 2024-02-01 MED ORDER — FLUOROURACIL CHEMO INJECTION 2.5 GM/50ML
400.0000 mg/m2 | Freq: Once | INTRAVENOUS | Status: AC
  Administered 2024-02-01: 800 mg via INTRAVENOUS
  Filled 2024-02-01: qty 16

## 2024-02-01 MED ORDER — LEUCOVORIN CALCIUM INJECTION 350 MG
400.0000 mg/m2 | Freq: Once | INTRAVENOUS | Status: AC
  Administered 2024-02-01: 808 mg via INTRAVENOUS
  Filled 2024-02-01: qty 40.4

## 2024-02-01 MED ORDER — OXALIPLATIN CHEMO INJECTION 100 MG/20ML
85.0000 mg/m2 | Freq: Once | INTRAVENOUS | Status: AC
  Administered 2024-02-01: 170 mg via INTRAVENOUS
  Filled 2024-02-01: qty 34

## 2024-02-01 MED ORDER — SODIUM CHLORIDE 0.9 % IV SOLN
2400.0000 mg/m2 | INTRAVENOUS | Status: DC
  Administered 2024-02-01: 5000 mg via INTRAVENOUS
  Filled 2024-02-01: qty 100

## 2024-02-01 MED ORDER — DEXTROSE 5 % IV SOLN: INTRAVENOUS | Status: DC

## 2024-02-01 MED ORDER — DEXAMETHASONE SOD PHOSPHATE PF 10 MG/ML IJ SOLN
10.0000 mg | Freq: Once | INTRAMUSCULAR | Status: AC
  Administered 2024-02-01: 10 mg via INTRAVENOUS

## 2024-02-01 NOTE — Assessment & Plan Note (Signed)
 Please review oncology history for additional details and timeline of events.    Intrahepatic cholangiocarcinoma, diagnosed following MRI and liver biopsy in May 2025.  He underwent ultrasound-guided biopsy of liver lesion on 07/29/2023.  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 CDX-2 positive, CK20 positive, CK7 positive, synaptophysin negative, chromogranin negative.  IHC profile suggested a gastrointestinal or pancreaticobiliary primary.  Clinical picture is consistent with intrahepatic cholangiocarcinoma, primarily located in the right lobe of the liver with disease limited to the liver area. A new small lesion on the dome of the left side of the liver noted on PET scan and also recent CT scan.  Too small to be biopsied.  Plan made to monitor this on future scans.   His case was previously discussed in GI multidisciplinary tumor conference. 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  Given history of autoimmune hepatitis, primary sclerosing cholangitis, he would not be a candidate for immunotherapy.  Hence plan would be to proceed with chemotherapy using cisplatin  and gemcitabine .  Following drainage of pelvic/anal abscess, once we have obtained clearance from the surgery department, and once his C. difficile was cleared, he was started on systemic chemotherapy with cisplatin  and gemcitabine  from 09/28/2023.  Plan was to continue once weekly for two weeks on, one week off, depending on tolerance.   Following completion of 3 cycles of chemotherapy, restaging MRI of the abdomen on 12/07/2023 showed progressive disease with diffuse mets in the liver.  Discussed results with the patient in detail previously.  Clinically he seemed to have improved compared to prior.  This was an unfortunate development.  Reviewed NCCN guidelines  and discussed treatment options.  Proposed switching treatments to FOLFOX.  Discussed side effect profile including diarrhea, hand-foot syndrome with 5-FU and cold sensitivity, progressive neuropathy with oxaliplatin  in addition to other side effects.  Patient verbalized understanding and gave consent to proceed with this treatment option.  Proceeded with cycle 1 of FOLFOX from 12/14/2023.  Plan is to continue this every 2 weeks with restaging imaging in approximately 3 months from the time we switched treatments.  Tolerated cycle 1 of FOLFOX reasonably well.  He did develop a rash on his face, which he had previously also.  Recommended avoiding sun exposure, aloe vera cream with topical cortisone.  He did not experience rash after cycle 2.  Cycle 3 was deferred until today as patient had surgery for fistula repair that is related to his Crohn's on 01/13/2024.  Recovered well from surgical procedure.  Labs today show no dose-limiting toxicities.  Will proceed with cycle 3 of FOLFOX as scheduled.  We will expedite 5-FU infusion at home so that he can return early on Friday, as the weather is getting bad that day.  - Monitor blood counts, liver function, and for side effects such as nausea, fatigue, hand-foot syndrome, diarrhea and neuropathy. - Advise on precautions with cold sensitivity: eat and drink at room temperature, wear gloves when handling cold items.  - Monitor CA 19-9 and CEA levels periodically.   RTC in 2 weeks for cycle 4 of FOLFOX.

## 2024-02-01 NOTE — Progress Notes (Signed)
 Per Dr. Autumn, ok to speed 5-FU pump to run over 44 hours (5.65mL/hr) d/t pt's inclement weather travel concerns for Friday.   Odella Appelhans, PharmD, MBA

## 2024-02-01 NOTE — Progress Notes (Signed)
 [facility]  ONCOLOGY CLINIC PROGRESS NOTE   Patient Care Team: [name], MD as PCP - General (Family Medicine) Avram Lupita BRAVO, MD as Attending Physician (Gastroenterology) Harvey Carlin BRAVO, MD (Inactive) as Attending Physician (Vascular Surgery) Ardis Evalene CROME, RN as Oncology Nurse Navigator Shaneal Barasch, Chinita, MD as Consulting Physician (Hematology and Oncology)  PATIENT NAME: [name]   MR#: [id number] DOB: 1965/03/22  Date of visit: 02/01/2024   ASSESSMENT & PLAN:   [name] is a 58 y.o.  gentleman with a past medical history of primary sclerosing cholangitis, autoimmune hepatitis, Crohn's disease, anxiety/depression, PTSD, past C. difficile colitis, chronic pain syndrome, chronic mesenteric ischemia, basal cell carcinoma of left lower extremity, history of suicide attempt, past portal vein thrombosis, was referred to our clinic in June 2025 for newly diagnosed intrahepatic cholangiocarcinoma.  cT2, cN0, cM1.  Intrahepatic cholangiocarcinoma ([name]) Please review oncology history for additional details and timeline of events.    Intrahepatic cholangiocarcinoma, diagnosed following MRI and liver biopsy in May 2025.  He underwent ultrasound-guided biopsy of liver lesion on 07/29/2023.  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 CDX-2 positive, CK20 positive, CK7 positive, synaptophysin negative, chromogranin negative.  IHC profile suggested a gastrointestinal or pancreaticobiliary primary.  Clinical picture is consistent with intrahepatic cholangiocarcinoma, primarily located in the right lobe of the liver with disease limited to the liver area. A new small lesion on the dome of the left side of the liver noted on PET scan and also recent CT scan.  Too small to be biopsied.  Plan made to monitor this on future scans.   His case was previously discussed in GI  multidisciplinary tumor conference. 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  Given history of autoimmune hepatitis, primary sclerosing cholangitis, he would not be a candidate for immunotherapy.  Hence plan would be to proceed with chemotherapy using cisplatin  and gemcitabine .  Following drainage of pelvic/anal abscess, once we have obtained clearance from the surgery department, and once his C. difficile was cleared, he was started on systemic chemotherapy with cisplatin  and gemcitabine  from 09/28/2023.  Plan was to continue once weekly for two weeks on, one week off, depending on tolerance.   Following completion of 3 cycles of chemotherapy, restaging MRI of the abdomen on 12/07/2023 showed progressive disease with diffuse mets in the liver.  Discussed results with the patient in detail previously.  Clinically he seemed to have improved compared to prior.  This was an unfortunate development.  Reviewed NCCN guidelines and discussed treatment options.  Proposed switching treatments to FOLFOX.  Discussed side effect profile including diarrhea, hand-foot syndrome with 5-FU and cold sensitivity, progressive neuropathy with oxaliplatin  in addition to other side effects.  Patient verbalized understanding and gave consent to proceed with this treatment option.  Proceeded with cycle 1 of FOLFOX from 12/14/2023.  Plan is to continue this every 2 weeks with restaging imaging in approximately 3 months from the time we switched treatments.  Tolerated cycle 1 of FOLFOX reasonably well.  He did develop a rash on his face, which he had previously also.  Recommended avoiding sun exposure, aloe vera cream with topical cortisone.  He did not experience rash after cycle 2.  Cycle 3 was deferred until today as patient had surgery for fistula repair that is related to his Crohn's on 01/13/2024.  Recovered well from  surgical procedure.  Labs today show no dose-limiting toxicities.  Will proceed with cycle 3 of FOLFOX as scheduled.  We will expedite 5-FU infusion at home so that he can return early on Friday, as the weather is getting bad that day.  - Monitor blood counts, liver function, and for side effects such as nausea, fatigue, hand-foot syndrome, diarrhea and neuropathy. - Advise on precautions with cold sensitivity: eat and drink at room temperature, wear gloves when handling cold items.  - Monitor CA 19-9 and CEA levels periodically.   RTC in 2 weeks for cycle 4 of FOLFOX.  Mild leukocytosis White blood cell count is slightly elevated at 11,800, likely due to recent surgical procedures. No immediate intervention required.  Thrombocytopenia Platelet count is slightly low at 121,000. No immediate intervention required as it does not necessitate adjustment of current treatment.  Elevated liver enzymes Liver enzymes have improved. Alkaline phosphatase decreased from the 200s to 143. AST is slightly elevated at 46, and ALT is slightly elevated at 67. Improvement allows for continuation of chemotherapy. - Proceeded with chemotherapy as liver enzyme levels are acceptable  History of Clostridioides difficile infection Clostridioides difficile infection has resolved.   I reviewed lab results and outside records for this visit and discussed relevant results with the patient. Diagnosis, plan of care and treatment options were also discussed in detail with the patient. Opportunity provided to ask questions and answers provided to his apparent satisfaction. Provided instructions to call our clinic with any problems, questions or concerns prior to return visit. I recommended to continue follow-up with PCP and sub-specialists. He verbalized understanding and agreed with the plan.   NCCN guidelines have been consulted in the planning of this patient's care.  I spent a total of 42 minutes during this encounter  with the patient including review of chart and various tests results, discussions about plan of care and coordination of care plan.   Chinita Patten, MD  02/01/2024 5:56 PM  [facility] - A DEPT OF [facility] [address] [place] [address] Dept: [phone] Dept Fax: [phone]    CHIEF COMPLAINT/ REASON FOR VISIT:   Intrahepatic cholangiocarcinoma, diagnosed in June 2025.  Current Treatment: Plan made for systemic treatment with cisplatin  and gemcitabine , followed by surgical evaluation.  Started on systemic chemotherapy with cisplatin  and gemcitabine  from 09/28/2023.  Immunotherapy avoided because of his history of autoimmune hepatitis and primary sclerosing cholangitis.  Following completion of 3 cycles of chemotherapy, restaging MRI of the abdomen on 12/07/2023 showed progressive disease with diffuse mets in the liver.  Treatment switched to FOLFOX from 12/14/2023.  INTERVAL HISTORY:    Discussed the use of AI scribe software for clinical note transcription with the patient, who gave verbal consent to proceed.  History of Present Illness Kelven Flater is a 57 year old male who presents for cycle three of chemotherapy following recent surgery.  He underwent surgery for fistula repair on January 13, 2024, and has recovered well. He experienced nausea during the first week post-treatment, which has since resolved. He is not currently on any antibiotics.  He did not have a recurrence of a previous rash after the second cycle. He used cortisone cream for a couple of days when the rash initially appeared. He experienced mild diarrhea for a couple of days after the initial treatment, which has resolved. No current pain, and he uses Tylenol  as needed for discomfort.  Recent blood work shows  a white blood cell count of 11,800 and a platelet count of 121,000. Liver function tests show alkaline  phosphatase at 143; previous levels were in the 200s. AST and ALT are 46 and 67, respectively.  He is increasing his fiber intake to manage bowel movements and reports no new or different symptoms since the last visit. No current nausea, vomiting, diarrhea, or pain.   I have reviewed the past medical history, past surgical history, social history and family history with the patient and they are unchanged from previous note.  HISTORY OF PRESENT ILLNESS:   ONCOLOGY HISTORY:   He has a history of autoimmune hepatitis, primary sclerosing cholangitis diagnosed in 2012, and Crohn's disease for which he is currently taking prednisone  and Entyvio .   He was admitted to the hospital on 07/02/2023 after he presented with complaints of multiple episodes of diarrhea, dizziness, near syncope.  CT scan of the abdomen and pelvis on 07/02/2023 showed new 3.7 x 2.8 cm noncystic lesion in the dome of the liver, which was not present on prior studies.  Given history of primary sclerosing cholangitis, there was concern for cholangiocarcinoma and MRI was recommended.   On 07/03/2023, MRI of the abdomen showed 4.3 x 2.8 x 3.7 cm lesion in the lateral aspect of the hepatic dome which showed heterogeneous enhancement after IV contrast administration, suspicious for neoplasm.  Cholangiocarcinoma was considered because of his history of primary sclerosing cholangitis.  Inferior and posterior to this lesion was a cyst measuring 5 x 4.5 cm, slightly increased in size compared to previous MRI.  Bile duct showed variable dilatation and stenosis.   Patient did not want to stay in the hospital for biopsy at that time.   He underwent ultrasound-guided biopsy of liver lesion on 07/29/2023.  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 CDX-2 positive, CK20 positive, CK7 positive, synaptophysin negative, chromogranin negative.  IHC profile suggested a gastrointestinal or  pancreaticobiliary primary.   To look for a primary, patient underwent upper endoscopy and colonoscopy on 08/18/2023, by Dr. Avram.  EGD showed evidence of gastritis, single gastric polyp.  Otherwise unremarkable.  Colonoscopy showed several polyps in the transverse colon, sigmoid colon, descending colon.  Evidence of diverticulosis in the sigmoid colon.  Multiple biopsies were obtained.  Biopsies from the stomach, colon, cecum all showed benign findings.  No evidence of malignancy noted.   Clinical picture is consistent with intrahepatic cholangiocarcinoma.   Given history of autoimmune hepatitis, primary sclerosing cholangitis, he would not be a candidate for immunotherapy.  Hence plan would be to proceed with chemotherapy using cisplatin  and gemcitabine .   Request submitted for NGS testing on the specimen and liquid biopsy.  Liquid biopsy showed no actionable mutations.   On 09/09/2023, staging PET scan showed marginal areas of uptake along the mass lesion in the posterior superior right hepatic lobe, corresponding to the area of known neoplasm.  There were small cystic areas elsewhere in the liver, new small low-density focus in the dome of left hepatic lobe.  No appreciable abnormal uptake.  No areas of abnormal uptake seen above the diaphragm.  Findings consistent with anal fistula noted.  His case was previously discussed in GI multidisciplinary tumor conference.  Dr. Dasie felt that he can be a surgical candidate following neoadjuvant chemotherapy.  Following drainage of pelvic/anal abscess, once we have obtained clearance from the surgery department, and once his C. difficile was cleared, he was started on systemic chemotherapy with cisplatin  and  gemcitabine  from 09/28/2023.  Plan made to continue once weekly for two weeks on, one week off, depending on tolerance.   Following completion of 3 cycles of chemotherapy, restaging MRI of the abdomen on 12/07/2023 showed progressive disease with  diffuse mets in the liver.  Treatment switched to FOLFOX from 12/14/2023.   Oncology History  Intrahepatic cholangiocarcinoma ([name])  08/18/2023 Initial Diagnosis   Cholangiocarcinoma metastatic to liver ([name])   08/26/2023 Cancer Staging   Staging form: Intrahepatic Bile Duct, AJCC 8th Edition - Clinical: Stage IV (cT2, cN0, cM1) - Signed by Autumn Millman, MD on 12/14/2023   09/28/2023 - 11/30/2023 Chemotherapy   Patient is on Treatment Plan : BILIARY TRACT Cisplatin  + Gemcitabine  D1,8 q21d     12/14/2023 -  Chemotherapy   Patient is on Treatment Plan : FOLFOX q14d         REVIEW OF SYSTEMS:   Review of Systems - Oncology  All other pertinent systems were reviewed with the patient and are negative.  ALLERGIES: He is allergic to tape, asacol  [mesalamine ], azathioprine, gluten meal, metoprolol  tartrate, mycophenolate mofetil, other, ciprofloxacin , keflex [cephalexin], abilify  [aripiprazole ], and amoxicillin .  MEDICATIONS:  Current Outpatient Medications  Medication Sig Dispense Refill   Cholecalciferol 125 MCG (5000 UT) TABS Take 1 tablet by mouth daily.     ferrous sulfate  325 (65 FE) MG tablet Take 325 mg by mouth daily with breakfast. Continues to take per low HGB     hydrOXYzine  (ATARAX ) 25 MG tablet Take 1 tablet (25 mg total) by mouth 3 (three) times daily as needed for anxiety. (Patient not taking: Reported on 01/09/2024) 30 tablet 0   lidocaine -prilocaine  (EMLA ) cream Apply to affected area once (Patient not taking: Reported on 01/09/2024) 30 g 3   Magnesium  125 MG CAPS One at bedtime (Patient not taking: Reported on 01/09/2024)     melatonin 3 MG TABS tablet Take 3 mg by mouth at bedtime.     metroNIDAZOLE  (FLAGYL ) 500 MG tablet Take 500 mg by mouth 2 (two) times daily.     mirtazapine  (REMERON ) 30 MG tablet Take 0.5 tablets (15 mg total) by mouth at bedtime. (Patient taking differently: Take 30 mg by mouth at bedtime.)     Nutritional Supplements (ENSURE HIGH PROTEIN)  LIQD Take 237 mLs by mouth in the morning and at bedtime.     ondansetron  (ZOFRAN ) 8 MG tablet Take 1 tablet (8 mg total) by mouth every 8 (eight) hours as needed for nausea or vomiting. Start on the third day after chemotherapy. (Patient not taking: Reported on 01/09/2024) 30 tablet 1   Potassium 99 MG TABS Take 1 tablet by mouth daily. (Patient not taking: Reported on 01/09/2024)     predniSONE  (DELTASONE ) 20 MG tablet Take 1 tablet (20 mg total) by mouth daily with breakfast. 90 tablet 1   prochlorperazine  (COMPAZINE ) 10 MG tablet Take 1 tablet (10 mg total) by mouth every 6 (six) hours as needed for nausea or vomiting. (Patient not taking: Reported on 01/09/2024) 30 tablet 1   Vedolizumab  (ENTYVIO  IV) Inject into the vein every 6 (six) weeks.     venlafaxine  XR (EFFEXOR -XR) 150 MG 24 hr capsule Take 1 capsule (150 mg total) by mouth daily with breakfast. 30 capsule 0   venlafaxine  XR (EFFEXOR -XR) 75 MG 24 hr capsule Take 75 mg by mouth daily.     No current facility-administered medications for this visit.     VITALS:   There were no vitals taken for this visit.  Wt  Readings from Last 3 Encounters:  02/01/24 182 lb 8 oz (82.8 kg)  01/19/24 177 lb 12 oz (80.6 kg)  01/13/24 177 lb (80.3 kg)    There is no height or weight on file to calculate BMI.        PHYSICAL EXAM:   Physical Exam Constitutional:      General: He is not in acute distress.    Appearance: Normal appearance.  HENT:     Head: Normocephalic and atraumatic.  Eyes:     Conjunctiva/sclera: Conjunctivae normal.  Cardiovascular:     Rate and Rhythm: Normal rate and regular rhythm.  Pulmonary:     Effort: Pulmonary effort is normal. No respiratory distress.  Chest:     Comments: Port-A-Cath in place without any signs of infection Abdominal:     General: There is no distension.  Skin:    Findings: Rash (Scattered, maculopapular rash on his face bilaterally) present.  Neurological:     General: No focal  deficit present.     Mental Status: He is alert and oriented to person, place, and time.  Psychiatric:        Mood and Affect: Mood normal.        Behavior: Behavior normal.       LABORATORY DATA:   I have reviewed the data as listed.  Results for orders placed or performed in visit on 02/01/24  CBC with Differential (Cancer Center Only)  Result Value Ref Range   WBC Count 11.8 (H) 4.0 - 10.5 K/uL   RBC 3.83 (L) 4.22 - 5.81 MIL/uL   Hemoglobin 11.9 (L) 13.0 - 17.0 g/dL   HCT 63.3 (L) 60.9 - 47.9 %   MCV 95.6 80.0 - 100.0 fL   MCH 31.1 26.0 - 34.0 pg   MCHC 32.5 30.0 - 36.0 g/dL   RDW 80.6 (H) 88.4 - 84.4 %   Platelet Count 121 (L) 150 - 400 K/uL   nRBC 0.0 0.0 - 0.2 %   Neutrophils Relative % 85 %   Neutro Abs 10.0 (H) 1.7 - 7.7 K/uL   Lymphocytes Relative 8 %   Lymphs Abs 0.9 0.7 - 4.0 K/uL   Monocytes Relative 6 %   Monocytes Absolute 0.8 0.1 - 1.0 K/uL   Eosinophils Relative 0 %   Eosinophils Absolute 0.0 0.0 - 0.5 K/uL   Basophils Relative 0 %   Basophils Absolute 0.0 0.0 - 0.1 K/uL   Immature Granulocytes 1 %   Abs Immature Granulocytes 0.11 (H) 0.00 - 0.07 K/uL  CMP (Cancer Center only)  Result Value Ref Range   Sodium 139 135 - 145 mmol/L   Potassium 3.7 3.5 - 5.1 mmol/L   Chloride 104 98 - 111 mmol/L   CO2 27 22 - 32 mmol/L   Glucose, Bld 169 (H) 70 - 99 mg/dL   BUN 21 (H) 6 - 20 mg/dL   Creatinine 8.94 9.38 - 1.24 mg/dL   Calcium  8.6 (L) 8.9 - 10.3 mg/dL   Total Protein 5.8 (L) 6.5 - 8.1 g/dL   Albumin 3.4 (L) 3.5 - 5.0 g/dL   AST 46 (H) 15 - 41 U/L   ALT 67 (H) 0 - 44 U/L   Alkaline Phosphatase 143 (H) 38 - 126 U/L   Total Bilirubin 0.6 0.0 - 1.2 mg/dL   GFR, Estimated >39 >39 mL/min   Anion gap 9 5 - 15       RADIOGRAPHIC STUDIES:  No recent pertinent imaging available to review.  CODE STATUS:  Code Status History     Date Active Date Inactive Code Status Order ID Comments User Context   09/14/2023 1143 09/15/2023 0511 Full Code [id number]   Johann Sieving, MD HOV   08/20/2023 1401 08/23/2023 1406 Full Code [id number]  Lou Claretta HERO, MD ED   07/29/2023 1419 07/30/2023 0518 Full Code [id number]  Johann Sieving, MD HOV   07/02/2023 1533 07/05/2023 2312 Full Code [id number]  Celinda Alm Lot, MD ED   04/13/2023 1949 05/20/2023 1942 Full Code [id number]  Motley-Mangrum, Cathaleen LABOR, PMHNP Inpatient   03/28/2023 2234 04/04/2023 1348 Full Code [id number]  Alan Thurman GAILS, NP Inpatient   03/28/2023 1136 03/28/2023 2111 Full Code [id number]  Minnie Tinnie BRAVO, PA ED   08/26/2018 1234 09/01/2018 2105 Full Code [id number]  Arloa Chroman, PA-C ED   08/25/2018 1240 08/25/2018 2000 Full Code [id number]  Little, Vernell Search, MD ED   07/31/2018 1406 08/01/2018 1446 Full Code [id number]  Zada Elouise BROCKS, PA-C ED   07/19/2018 2045 07/21/2018 1739 DNR [id number]  [place], Eric J, DO Inpatient   05/28/2018 1506 06/01/2018 1222 DNR [id number]  Barbarann Nest, MD Inpatient   05/20/2012 1059 05/25/2012 1606 DNR [id number]  Cherisse Ronal ORN, NP Inpatient   05/18/2012 1723 05/20/2012 1059 Full Code [id number]  Juvenal Harlene PENNER, DO Inpatient    Questions for Most Recent Historical Code Status (Order [id number])     Question Answer   By: Other            No orders of the defined types were placed in this encounter.    Future Appointments  Date Time Provider Department Center  02/03/2024  8:00 AM [facility] FLUSH [facility] None  02/15/2024  9:15 AM [facility] FLUSH [facility] None  02/15/2024  9:45 AM Nasir Bright, MD [facility] None  02/15/2024 10:15 AM [facility] INFUSION [facility] None  02/17/2024  2:30 PM [facility] FLUSH [facility] None     This document was completed utilizing speech recognition software. Grammatical errors, random word insertions, pronoun errors, and incomplete sentences are an occasional consequence of this system due to software limitations, ambient noise, and hardware issues. Any formal questions or concerns about the content, text or  information contained within the body of this dictation should be directly addressed to the provider for clarification.

## 2024-02-01 NOTE — Patient Instructions (Signed)
 [facility] - A DEPT OF [facility]  Discharge Instructions: Thank you for choosing [facility] to provide your oncology and hematology care.   If you have a lab appointment with the Cancer Center, please go directly to the Cancer Center and check in at the registration area.   Wear comfortable clothing and clothing appropriate for easy access to any Portacath or PICC line.   We strive to give you quality time with your provider. You may need to reschedule your appointment if you arrive late (15 or more minutes).  Arriving late affects you and other patients whose appointments are after yours.  Also, if you miss three or more appointments without notifying the office, you may be dismissed from the clinic at the provider's discretion.      For prescription refill requests, have your pharmacy contact our office and allow 72 hours for refills to be completed.    Today you received the following chemotherapy and/or immunotherapy agents: fluorouracil  (ADRUCIL ), leucovorin , oxaliplatin  (ELOXATIN )   To help prevent nausea and vomiting after your treatment, we encourage you to take your nausea medication as directed.  BELOW ARE SYMPTOMS THAT SHOULD BE REPORTED IMMEDIATELY: *FEVER GREATER THAN 100.4 F (38 C) OR HIGHER *CHILLS OR SWEATING *NAUSEA AND VOMITING THAT IS NOT CONTROLLED WITH YOUR NAUSEA MEDICATION *UNUSUAL SHORTNESS OF BREATH *UNUSUAL BRUISING OR BLEEDING *URINARY PROBLEMS (pain or burning when urinating, or frequent urination) *BOWEL PROBLEMS (unusual diarrhea, constipation, pain near the anus) TENDERNESS IN MOUTH AND THROAT WITH OR WITHOUT PRESENCE OF ULCERS (sore throat, sores in mouth, or a toothache) UNUSUAL RASH, SWELLING OR PAIN  UNUSUAL VAGINAL DISCHARGE OR ITCHING   Items with * indicate a potential emergency and should be followed up as soon as possible or go to the Emergency Department if any problems should occur.  Please show  the CHEMOTHERAPY ALERT CARD or IMMUNOTHERAPY ALERT CARD at check-in to the Emergency Department and triage nurse.  Should you have questions after your visit or need to cancel or reschedule your appointment, please contact [facility] - A DEPT OF [name]  Dept: [phone]  and follow the prompts.  Office hours are 8:00 a.m. to 4:30 p.m. Monday - Friday. Please note that voicemails left after 4:00 p.m. may not be returned until the following business day.  We are closed weekends and major holidays. You have access to a nurse at all times for urgent questions. Please call the main number to the clinic Dept: [phone] and follow the prompts.   For any non-urgent questions, you may also contact your provider using MyChart. We now offer e-Visits for anyone 73 and older to request care online for non-urgent symptoms. For details visit [email or web address].   Also download the MyChart app! Go to the app store, search MyChart, open the app, select [facility], and log in with your MyChart username and password.

## 2024-02-03 ENCOUNTER — Inpatient Hospital Stay

## 2024-02-04 ENCOUNTER — Other Ambulatory Visit: Payer: Self-pay | Admitting: Oncology

## 2024-02-04 DIAGNOSIS — C221 Intrahepatic bile duct carcinoma: Secondary | ICD-10-CM

## 2024-02-06 ENCOUNTER — Encounter: Payer: Self-pay | Admitting: Oncology

## 2024-02-06 ENCOUNTER — Other Ambulatory Visit: Payer: Self-pay | Admitting: *Deleted

## 2024-02-06 DIAGNOSIS — C221 Intrahepatic bile duct carcinoma: Secondary | ICD-10-CM

## 2024-02-06 MED ORDER — ONDANSETRON HCL 8 MG PO TABS: 8.0000 mg | ORAL_TABLET | Freq: Three times a day (TID) | ORAL | 1 refills | Status: AC | PRN

## 2024-02-09 DIAGNOSIS — F330 Major depressive disorder, recurrent, mild: Secondary | ICD-10-CM | POA: Diagnosis not present

## 2024-02-09 DIAGNOSIS — K5090 Crohn's disease, unspecified, without complications: Secondary | ICD-10-CM | POA: Diagnosis not present

## 2024-02-09 DIAGNOSIS — F411 Generalized anxiety disorder: Secondary | ICD-10-CM | POA: Diagnosis not present

## 2024-02-14 DIAGNOSIS — M9903 Segmental and somatic dysfunction of lumbar region: Secondary | ICD-10-CM | POA: Diagnosis not present

## 2024-02-15 ENCOUNTER — Inpatient Hospital Stay: Admitting: Oncology

## 2024-02-15 ENCOUNTER — Inpatient Hospital Stay

## 2024-02-15 ENCOUNTER — Encounter: Payer: Self-pay | Admitting: Oncology

## 2024-02-15 ENCOUNTER — Other Ambulatory Visit: Payer: Self-pay

## 2024-02-15 VITALS — BP 154/96 | HR 89 | Temp 97.3°F | Resp 17 | Ht 73.5 in | Wt 182.1 lb

## 2024-02-15 DIAGNOSIS — C221 Intrahepatic bile duct carcinoma: Secondary | ICD-10-CM

## 2024-02-15 DIAGNOSIS — Z5111 Encounter for antineoplastic chemotherapy: Secondary | ICD-10-CM | POA: Diagnosis not present

## 2024-02-15 LAB — CBC WITH DIFFERENTIAL (CANCER CENTER ONLY)
Abs Immature Granulocytes: 0.1 K/uL — ABNORMAL HIGH (ref 0.00–0.07)
Basophils Absolute: 0 K/uL (ref 0.0–0.1)
Basophils Relative: 0 %
Eosinophils Absolute: 0 K/uL (ref 0.0–0.5)
Eosinophils Relative: 0 %
HCT: 38 % — ABNORMAL LOW (ref 39.0–52.0)
Hemoglobin: 12.4 g/dL — ABNORMAL LOW (ref 13.0–17.0)
Immature Granulocytes: 2 %
Lymphocytes Relative: 11 %
Lymphs Abs: 0.5 K/uL — ABNORMAL LOW (ref 0.7–4.0)
MCH: 31.3 pg (ref 26.0–34.0)
MCHC: 32.6 g/dL (ref 30.0–36.0)
MCV: 96 fL (ref 80.0–100.0)
Monocytes Absolute: 0.8 K/uL (ref 0.1–1.0)
Monocytes Relative: 16 %
Neutro Abs: 3.3 K/uL (ref 1.7–7.7)
Neutrophils Relative %: 71 %
Platelet Count: 173 K/uL (ref 150–400)
RBC: 3.96 MIL/uL — ABNORMAL LOW (ref 4.22–5.81)
RDW: 18.1 % — ABNORMAL HIGH (ref 11.5–15.5)
WBC Count: 4.7 K/uL (ref 4.0–10.5)
nRBC: 0 % (ref 0.0–0.2)

## 2024-02-15 LAB — CMP (CANCER CENTER ONLY)
ALT: 47 U/L — ABNORMAL HIGH (ref 0–44)
AST: 42 U/L — ABNORMAL HIGH (ref 15–41)
Albumin: 3.6 g/dL (ref 3.5–5.0)
Alkaline Phosphatase: 147 U/L — ABNORMAL HIGH (ref 38–126)
Anion gap: 9 (ref 5–15)
BUN: 24 mg/dL — ABNORMAL HIGH (ref 6–20)
CO2: 26 mmol/L (ref 22–32)
Calcium: 9.3 mg/dL (ref 8.9–10.3)
Chloride: 105 mmol/L (ref 98–111)
Creatinine: 1.06 mg/dL (ref 0.61–1.24)
GFR, Estimated: >60 mL/min (ref >60)
Glucose, Bld: 101 mg/dL — ABNORMAL HIGH (ref 70–99)
Potassium: 3.9 mmol/L (ref 3.5–5.1)
Sodium: 140 mmol/L (ref 135–145)
Total Bilirubin: 0.4 mg/dL (ref 0.0–1.2)
Total Protein: 6.3 g/dL — ABNORMAL LOW (ref 6.5–8.1)

## 2024-02-15 MED ORDER — LEUCOVORIN CALCIUM INJECTION 350 MG
400.0000 mg/m2 | Freq: Once | INTRAVENOUS | Status: AC
  Administered 2024-02-15: 11:00:00 808 mg via INTRAVENOUS
  Filled 2024-02-15: qty 25

## 2024-02-15 MED ORDER — OXALIPLATIN CHEMO INJECTION 100 MG/20ML
85.0000 mg/m2 | Freq: Once | INTRAVENOUS | Status: AC
  Administered 2024-02-15: 11:00:00 170 mg via INTRAVENOUS
  Filled 2024-02-15: qty 14

## 2024-02-15 MED ORDER — SODIUM CHLORIDE 0.9 % IV SOLN
2400.0000 mg/m2 | INTRAVENOUS | Status: DC
  Administered 2024-02-15: 14:00:00 5000 mg via INTRAVENOUS
  Filled 2024-02-15: qty 100

## 2024-02-15 MED ORDER — FLUOROURACIL CHEMO INJECTION 2.5 GM/50ML
400.0000 mg/m2 | Freq: Once | INTRAVENOUS | Status: AC
  Administered 2024-02-15: 14:00:00 800 mg via INTRAVENOUS
  Filled 2024-02-15: qty 16

## 2024-02-15 MED ORDER — DEXAMETHASONE SOD PHOSPHATE PF 10 MG/ML IJ SOLN
10.0000 mg | Freq: Once | INTRAMUSCULAR | Status: AC
  Administered 2024-02-15: 11:00:00 10 mg via INTRAVENOUS

## 2024-02-15 MED ORDER — PALONOSETRON HCL INJECTION 0.25 MG/5ML
0.2500 mg | Freq: Once | INTRAVENOUS | Status: AC
  Administered 2024-02-15: 11:00:00 0.25 mg via INTRAVENOUS
  Filled 2024-02-15: qty 5

## 2024-02-15 MED ORDER — DEXTROSE 5 % IV SOLN: INTRAVENOUS | Status: DC

## 2024-02-15 NOTE — Assessment & Plan Note (Signed)
 Please review oncology history for additional details and timeline of events.    Intrahepatic cholangiocarcinoma, diagnosed following MRI and liver biopsy in May 2025.  He underwent ultrasound-guided biopsy of liver lesion on 07/29/2023.  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 CDX-2 positive, CK20 positive, CK7 positive, synaptophysin negative, chromogranin negative.  IHC profile suggested a gastrointestinal or pancreaticobiliary primary.  Clinical picture is consistent with intrahepatic cholangiocarcinoma, primarily located in the right lobe of the liver with disease limited to the liver area. A new small lesion on the dome of the left side of the liver noted on PET scan and also recent CT scan.  Too small to be biopsied.  Plan made to monitor this on future scans.   His case was previously discussed in GI multidisciplinary tumor conference. 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  Given history of autoimmune hepatitis, primary sclerosing cholangitis, he would not be a candidate for immunotherapy.  Hence plan would be to proceed with chemotherapy using cisplatin  and gemcitabine .  Following drainage of pelvic/anal abscess, once we have obtained clearance from the surgery department, and once his C. difficile was cleared, he was started on systemic chemotherapy with cisplatin  and gemcitabine  from 09/28/2023.  Plan was to continue once weekly for two weeks on, one week off, depending on tolerance.   Following completion of 3 cycles of chemotherapy, restaging MRI of the abdomen on 12/07/2023 showed progressive disease with diffuse mets in the liver.  Discussed results with the patient in detail previously.  Clinically he seemed to have improved compared to prior.  This was an unfortunate development.  Reviewed NCCN guidelines  and discussed treatment options.  Proposed switching treatments to FOLFOX.  Discussed side effect profile including diarrhea, hand-foot syndrome with 5-FU and cold sensitivity, progressive neuropathy with oxaliplatin  in addition to other side effects.  Patient verbalized understanding and gave consent to proceed with this treatment option.  Proceeded with cycle 1 of FOLFOX from 12/14/2023.  Plan is to continue this every 2 weeks with restaging imaging in approximately 3 months from the time we switched treatments.  Tolerated cycle 1 of FOLFOX reasonably well.  He did develop a rash on his face, which he had previously also.  Recommended avoiding sun exposure, aloe vera cream with topical cortisone.  He did not experience rash after cycle 2.  Cycle 3 was deferred until 02/01/24 as patient had surgery for fistula repair that is related to his Crohn's on 01/13/2024.  Recovered well from surgical procedure.  Labs today show no dose-limiting toxicities.  Will proceed with cycle 4 of FOLFOX as scheduled.    - Monitor blood counts, liver function, and for side effects such as nausea, fatigue, hand-foot syndrome, diarrhea and neuropathy. - Advise on precautions with cold sensitivity: eat and drink at room temperature, wear gloves when handling cold items.  - Monitor CA 19-9 and CEA levels periodically.   RTC in 2 weeks for cycle 5 of FOLFOX.  I will plan to see him when he returns to clinic for cycle 6 of FOLFOX.  Will obtain restaging imaging prior to that visit.  Request placed for MRI liver protocol.

## 2024-02-15 NOTE — Patient Instructions (Signed)
 [facility] - A DEPT OF [address] [facility]  Discharge Instructions: Thank you for choosing [facility] to provide your oncology and hematology care.   If you have a lab appointment with the Cancer Center, please go directly to the Cancer Center and check in at the registration area.   Wear comfortable clothing and clothing appropriate for easy access to any Portacath or PICC line.   We strive to give you quality time with your provider. You may need to reschedule your appointment if you arrive late (15 or more minutes).  Arriving late affects you and other patients whose appointments are after yours.  Also, if you miss three or more appointments without notifying the office, you may be dismissed from the clinic at the providers discretion.      For prescription refill requests, have your pharmacy contact our office and allow 72 hours for refills to be completed.    Today you received the following chemotherapy and/or immunotherapy agents fluorouracil  (ADRUCIL ), leucovorin , oxaliplatin  (ELOXATIN )     To help prevent nausea and vomiting after your treatment, we encourage you to take your nausea medication as directed.  BELOW ARE SYMPTOMS THAT SHOULD BE REPORTED IMMEDIATELY: *FEVER GREATER THAN 100.4 F (38 C) OR HIGHER *CHILLS OR SWEATING *NAUSEA AND VOMITING THAT IS NOT CONTROLLED WITH YOUR NAUSEA MEDICATION *UNUSUAL SHORTNESS OF BREATH *UNUSUAL BRUISING OR BLEEDING *URINARY PROBLEMS (pain or burning when urinating, or frequent urination) *BOWEL PROBLEMS (unusual diarrhea, constipation, pain near the anus) TENDERNESS IN MOUTH AND THROAT WITH OR WITHOUT PRESENCE OF ULCERS (sore throat, sores in mouth, or a toothache) UNUSUAL RASH, SWELLING OR PAIN  UNUSUAL VAGINAL DISCHARGE OR ITCHING   Items with * indicate a potential emergency and should be followed up as soon as possible or go to the Emergency Department if any problems should occur.  Please  show the CHEMOTHERAPY ALERT CARD or IMMUNOTHERAPY ALERT CARD at check-in to the Emergency Department and triage nurse.  Should you have questions after your visit or need to cancel or reschedule your appointment, please contact [facility] - A DEPT OF [facility]  Dept: [phone]  and follow the prompts.  Office hours are 8:00 a.m. to 4:30 p.m. Monday - Friday. Please note that voicemails left after 4:00 p.m. may not be returned until the following business day.  We are closed weekends and major holidays. You have access to a nurse at all times for urgent questions. Please call the main number to the clinic Dept: [phone] and follow the prompts.   For any non-urgent questions, you may also contact your provider using MyChart. We now offer e-Visits for anyone 61 and older to request care online for non-urgent symptoms. For details visit [email or web address].   Also download the MyChart app! Go to the app store, search MyChart, open the app, select [facility], and log in with your MyChart username and password.  The chemotherapy medication bag should finish at 46 hours, 96 hours, or 7 days. For example, if your pump is scheduled for 46 hours and it was put on at 4:00 p.m., it should finish at 2:00 p.m. the day it is scheduled to come off regardless of your appointment time.     Estimated time to finish at 02/17/24 300pm   If the display on your pump reads Low Volume and it is beeping, take the batteries out of the pump and come to the cancer  center for it to be taken off.   If the pump alarms go off prior to the pump reading Low Volume then call [phone] and someone can assist you.  If the plunger comes out and the chemotherapy medication is leaking out, please use your home chemo spill kit to clean up the spill. Do NOT use paper towels or other household products.  If you have problems or questions regarding your pump, please call either  [phone] (24 hours a day) or the cancer center Monday-Friday 8:00 a.m.- 4:30 p.m. at the clinic number and we will assist you. If you are unable to get assistance, then go to the nearest Emergency Department and ask the staff to contact the IV team for assistance.

## 2024-02-15 NOTE — Progress Notes (Signed)
 [name]  ONCOLOGY CLINIC PROGRESS NOTE   Patient Care Team: [name], MD as PCP - General (Family Medicine) Avram Lupita BRAVO, MD as Attending Physician (Gastroenterology) Harvey Carlin BRAVO, MD (Inactive) as Attending Physician (Vascular Surgery) Ardis Evalene CROME, RN as Oncology Nurse Navigator Gisselle Galvis, Chinita, MD as Consulting Physician (Hematology and Oncology)  PATIENT NAME: [name]   MR#: [id number] DOB: 1965-03-30  Date of visit: 02/15/2024   ASSESSMENT & PLAN:   [name] is a 58 y.o.  gentleman with a past medical history of primary sclerosing cholangitis, autoimmune hepatitis, Crohn's disease, anxiety/depression, PTSD, past C. difficile colitis, chronic pain syndrome, chronic mesenteric ischemia, basal cell carcinoma of left lower extremity, history of suicide attempt, past portal vein thrombosis, was referred to our clinic in June 2025 for newly diagnosed intrahepatic cholangiocarcinoma.  cT2, cN0, cM1.  Intrahepatic cholangiocarcinoma ([name]) Please review oncology history for additional details and timeline of events.    Intrahepatic cholangiocarcinoma, diagnosed following MRI and liver biopsy in May 2025.  He underwent ultrasound-guided biopsy of liver lesion on 07/29/2023.  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 CDX-2 positive, CK20 positive, CK7 positive, synaptophysin negative, chromogranin negative.  IHC profile suggested a gastrointestinal or pancreaticobiliary primary.  Clinical picture is consistent with intrahepatic cholangiocarcinoma, primarily located in the right lobe of the liver with disease limited to the liver area. A new small lesion on the dome of the left side of the liver noted on PET scan and also recent CT scan.  Too small to be biopsied.  Plan made to monitor this on future scans.   His case was previously discussed in GI  multidisciplinary tumor conference. 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  Given history of autoimmune hepatitis, primary sclerosing cholangitis, he would not be a candidate for immunotherapy.  Hence plan would be to proceed with chemotherapy using cisplatin  and gemcitabine .  Following drainage of pelvic/anal abscess, once we have obtained clearance from the surgery department, and once his C. difficile was cleared, he was started on systemic chemotherapy with cisplatin  and gemcitabine  from 09/28/2023.  Plan was to continue once weekly for two weeks on, one week off, depending on tolerance.   Following completion of 3 cycles of chemotherapy, restaging MRI of the abdomen on 12/07/2023 showed progressive disease with diffuse mets in the liver.  Discussed results with the patient in detail previously.  Clinically he seemed to have improved compared to prior.  This was an unfortunate development.  Reviewed NCCN guidelines and discussed treatment options.  Proposed switching treatments to FOLFOX.  Discussed side effect profile including diarrhea, hand-foot syndrome with 5-FU and cold sensitivity, progressive neuropathy with oxaliplatin  in addition to other side effects.  Patient verbalized understanding and gave consent to proceed with this treatment option.  Proceeded with cycle 1 of FOLFOX from 12/14/2023.  Plan is to continue this every 2 weeks with restaging imaging in approximately 3 months from the time we switched treatments.  Tolerated cycle 1 of FOLFOX reasonably well.  He did develop a rash on his face, which he had previously also.  Recommended avoiding sun exposure, aloe vera cream with topical cortisone.  He did not experience rash after cycle 2.  Cycle 3 was deferred until 02/01/24 as patient had surgery for fistula repair that is related to his Crohn's on 01/13/2024.  Recovered well from  surgical procedure.  Labs today show no dose-limiting toxicities.  Will proceed with cycle 4 of FOLFOX as scheduled.    - Monitor blood counts, liver function, and for side effects such as nausea, fatigue, hand-foot syndrome, diarrhea and neuropathy. - Advise on precautions with cold sensitivity: eat and drink at room temperature, wear gloves when handling cold items.  - Monitor CA 19-9 and CEA levels periodically.   RTC in 2 weeks for cycle 5 of FOLFOX.  I will plan to see him when he returns to clinic for cycle 6 of FOLFOX.  Will obtain restaging imaging prior to that visit.  Request placed for MRI liver protocol.   Elevated liver enzymes Liver enzymes have improved. Alkaline phosphatase decreased from the 200s to 143. AST is slightly elevated at 42, and ALT is slightly elevated at 47. Improvement allows for continuation of chemotherapy. - Proceeded with chemotherapy as liver enzyme levels are acceptable  History of Clostridioides difficile infection Clostridioides difficile infection has resolved.   I reviewed lab results and outside records for this visit and discussed relevant results with the patient. Diagnosis, plan of care and treatment options were also discussed in detail with the patient. Opportunity provided to ask questions and answers provided to his apparent satisfaction. Provided instructions to call our clinic with any problems, questions or concerns prior to return visit. I recommended to continue follow-up with PCP and sub-specialists. He verbalized understanding and agreed with the plan.   NCCN guidelines have been consulted in the planning of this patients care.  I spent a total of 42 minutes during this encounter with the patient including review of chart and various tests results, discussions about plan of care and coordination of care plan.   Chinita Patten, MD  02/15/2024 5:30 PM  [name] - A DE[name] [address] [place] [address] Dept: [phone] Dept Fax: [phone]    CHIEF COMPLAINT/ REASON FOR VISIT:   Intrahepatic cholangiocarcinoma, diagnosed in June 2025.  Current Treatment: Plan made for systemic treatment with cisplatin  and gemcitabine , followed by surgical evaluation.  Started on systemic chemotherapy with cisplatin  and gemcitabine  from 09/28/2023.  Immunotherapy avoided because of his history of autoimmune hepatitis and primary sclerosing cholangitis.  Following completion of 3 cycles of chemotherapy, restaging MRI of the abdomen on 12/07/2023 showed progressive disease with diffuse mets in the liver.  Treatment switched to FOLFOX from 12/14/2023.  INTERVAL HISTORY:    Discussed the use of AI scribe software for clinical note transcription with the patient, who gave verbal consent to proceed.  History of Present Illness  [name] is a 58 year old male with malignant neoplasm of unspecified primary site undergoing chemotherapy who presents for routine follow-up and evaluation of treatment tolerance.  He is currently receiving his fifth or sixth cycle of chemotherapy. He states that the past several weeks have been good. Laboratory studies, including blood counts, bilirubin, AST, and ALT, have improved or normalized since the previous visit. Platelet count, previously decreased, is now within normal limits.  He denies dyspnea. Bowel movements are regular without constipation. He reports nausea and severe diarrhea, which he manages with loperamide . No other concerning symptoms were reported.   I have reviewed the past medical history, past surgical history, social history and family history with the patient and they are unchanged from previous note.  HISTORY OF PRESENT ILLNESS:   ONCOLOGY HISTORY:   He has a history of autoimmune  hepatitis, primary sclerosing cholangitis diagnosed in 2012, and Crohn's disease for  which he is currently taking prednisone  and Entyvio .   He was admitted to the hospital on 07/02/2023 after he presented with complaints of multiple episodes of diarrhea, dizziness, near syncope.  CT scan of the abdomen and pelvis on 07/02/2023 showed new 3.7 x 2.8 cm noncystic lesion in the dome of the liver, which was not present on prior studies.  Given history of primary sclerosing cholangitis, there was concern for cholangiocarcinoma and MRI was recommended.   On 07/03/2023, MRI of the abdomen showed 4.3 x 2.8 x 3.7 cm lesion in the lateral aspect of the hepatic dome which showed heterogeneous enhancement after IV contrast administration, suspicious for neoplasm.  Cholangiocarcinoma was considered because of his history of primary sclerosing cholangitis.  Inferior and posterior to this lesion was a cyst measuring 5 x 4.5 cm, slightly increased in size compared to previous MRI.  Bile duct showed variable dilatation and stenosis.   Patient did not want to stay in the hospital for biopsy at that time.   He underwent ultrasound-guided biopsy of liver lesion on 07/29/2023.  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 CDX-2 positive, CK20 positive, CK7 positive, synaptophysin negative, chromogranin negative.  IHC profile suggested a gastrointestinal or pancreaticobiliary primary.   To look for a primary, patient underwent upper endoscopy and colonoscopy on 08/18/2023, by Dr. Avram.  EGD showed evidence of gastritis, single gastric polyp.  Otherwise unremarkable.  Colonoscopy showed several polyps in the transverse colon, sigmoid colon, descending colon.  Evidence of diverticulosis in the sigmoid colon.  Multiple biopsies were obtained.  Biopsies from the stomach, colon, cecum all showed benign findings.  No evidence of malignancy noted.   Clinical picture is consistent with intrahepatic cholangiocarcinoma.   Given history of autoimmune  hepatitis, primary sclerosing cholangitis, he would not be a candidate for immunotherapy.  Hence plan would be to proceed with chemotherapy using cisplatin  and gemcitabine .   Request submitted for NGS testing on the specimen and liquid biopsy.  Liquid biopsy showed no actionable mutations.   On 09/09/2023, staging PET scan showed marginal areas of uptake along the mass lesion in the posterior superior right hepatic lobe, corresponding to the area of known neoplasm.  There were small cystic areas elsewhere in the liver, new small low-density focus in the dome of left hepatic lobe.  No appreciable abnormal uptake.  No areas of abnormal uptake seen above the diaphragm.  Findings consistent with anal fistula noted.  His case was previously discussed in GI multidisciplinary tumor conference.  Dr. Dasie felt that he can be a surgical candidate following neoadjuvant chemotherapy.  Following drainage of pelvic/anal abscess, once we have obtained clearance from the surgery department, and once his C. difficile was cleared, he was started on systemic chemotherapy with cisplatin  and gemcitabine  from 09/28/2023.  Plan made to continue once weekly for two weeks on, one week off, depending on tolerance.   Following completion of 3 cycles of chemotherapy, restaging MRI of the abdomen on 12/07/2023 showed progressive disease with diffuse mets in the liver.  Treatment switched to FOLFOX from 12/14/2023.   Oncology History  Intrahepatic cholangiocarcinoma ([name])  08/18/2023 Initial Diagnosis   Cholangiocarcinoma metastatic to liver ([name])   08/26/2023 Cancer Staging   Staging form: Intrahepatic Bile Duct, AJCC 8th Edition - Clinical: Stage IV (cT2, cN0, cM1) - Signed by Autumn Millman, MD on 12/14/2023   09/28/2023 - 11/30/2023 Chemotherapy  Patient is on Treatment Plan : BILIARY TRACT Cisplatin  + Gemcitabine  D1,8 q21d     12/14/2023 -  Chemotherapy   Patient is on Treatment Plan : FOLFOX q14d         REVIEW  OF SYSTEMS:   Review of Systems - Oncology  All other pertinent systems were reviewed with the patient and are negative.  ALLERGIES: He is allergic to tape, asacol  [mesalamine ], azathioprine, gluten meal, metoprolol  tartrate, mycophenolate mofetil, other, ciprofloxacin , keflex [cephalexin], abilify  [aripiprazole ], and amoxicillin .  MEDICATIONS:  Current Outpatient Medications  Medication Sig Dispense Refill   Cholecalciferol 125 MCG (5000 UT) TABS Take 1 tablet by mouth daily.     ferrous sulfate  325 (65 FE) MG tablet Take 325 mg by mouth daily with breakfast. Continues to take per low HGB     hydrOXYzine  (ATARAX ) 25 MG tablet Take 1 tablet (25 mg total) by mouth 3 (three) times daily as needed for anxiety. (Patient not taking: Reported on 01/09/2024) 30 tablet 0   lidocaine -prilocaine  (EMLA ) cream Apply to affected area once (Patient not taking: Reported on 01/09/2024) 30 g 3   Magnesium  125 MG CAPS One at bedtime (Patient not taking: Reported on 01/09/2024)     melatonin 3 MG TABS tablet Take 3 mg by mouth at bedtime.     metroNIDAZOLE  (FLAGYL ) 500 MG tablet Take 500 mg by mouth 2 (two) times daily.     mirtazapine  (REMERON ) 30 MG tablet Take 0.5 tablets (15 mg total) by mouth at bedtime. (Patient taking differently: Take 30 mg by mouth at bedtime.)     Nutritional Supplements (ENSURE HIGH PROTEIN) LIQD Take 237 mLs by mouth in the morning and at bedtime.     ondansetron  (ZOFRAN ) 8 MG tablet Take 1 tablet (8 mg total) by mouth every 8 (eight) hours as needed for nausea or vomiting. Start on the third day after chemotherapy. 30 tablet 1   Potassium 99 MG TABS Take 1 tablet by mouth daily. (Patient not taking: Reported on 01/09/2024)     predniSONE  (DELTASONE ) 20 MG tablet Take 1 tablet (20 mg total) by mouth daily with breakfast. 90 tablet 1   prochlorperazine  (COMPAZINE ) 10 MG tablet Take 1 tablet (10 mg total) by mouth every 6 (six) hours as needed for nausea or vomiting. (Patient not  taking: Reported on 01/09/2024) 30 tablet 1   Vedolizumab  (ENTYVIO  IV) Inject into the vein every 6 (six) weeks.     venlafaxine  XR (EFFEXOR -XR) 150 MG 24 hr capsule Take 1 capsule (150 mg total) by mouth daily with breakfast. 30 capsule 0   venlafaxine  XR (EFFEXOR -XR) 75 MG 24 hr capsule Take 75 mg by mouth daily.     No current facility-administered medications for this visit.   Facility-Administered Medications Ordered in Other Visits  Medication Dose Route Frequency Provider Last Rate Last Admin   dextrose  5 % solution   Intravenous Continuous Tyauna Lacaze, MD   Stopped at 02/15/24 1345   fluorouracil  (ADRUCIL ) 5,000 mg in sodium chloride  0.9 % 150 mL chemo infusion  2,400 mg/m2 (Treatment Plan Recorded) Intravenous 1 day or 1 dose Conception Doebler, MD   Infusion Verify at 02/15/24 1438     VITALS:   Blood pressure (!) 154/96, pulse 89, temperature (!) 97.3 F (36.3 C), temperature source Temporal, resp. rate 17, height 6' 1.5 (1.867 m), weight 182 lb 1.6 oz (82.6 kg), SpO2 100%.  Wt Readings from Last 3 Encounters:  02/15/24 182 lb 1.6 oz (82.6 kg)  02/01/24 182 lb  8 oz (82.8 kg)  01/19/24 177 lb 12 oz (80.6 kg)    Body mass index is 23.7 kg/m.    Onc Performance Status - 02/15/24 1700       ECOG Perf Status   ECOG Perf Status Restricted in physically strenuous activity but ambulatory and able to carry out work of a light or sedentary nature, e.g., light house work, office work      KPS SCALE   KPS % SCORE Able to carry on normal activity, minor s/s of disease             PHYSICAL EXAM:   Physical Exam Constitutional:      General: He is not in acute distress.    Appearance: Normal appearance.  HENT:     Head: Normocephalic and atraumatic.  Eyes:     Conjunctiva/sclera: Conjunctivae normal.  Cardiovascular:     Rate and Rhythm: Normal rate and regular rhythm.     Heart sounds: Normal heart sounds.  Pulmonary:     Effort: Pulmonary effort is normal. No  respiratory distress.     Breath sounds: Normal breath sounds.  Chest:     Comments: Port-A-Cath in place without any signs of infection Abdominal:     General: There is no distension.  Skin:    Findings: Rash: Resolved.  Neurological:     General: No focal deficit present.     Mental Status: He is alert and oriented to person, place, and time.  Psychiatric:        Mood and Affect: Mood normal.        Behavior: Behavior normal.       LABORATORY DATA:   I have reviewed the data as listed.  Results for orders placed or performed in visit on 02/15/24  CBC with Differential ([name])  Result Value Ref Range   WBC Count 4.7 4.0 - 10.5 K/uL   RBC 3.96 (L) 4.22 - 5.81 MIL/uL   Hemoglobin 12.4 (L) 13.0 - 17.0 g/dL   HCT 61.9 (L) 60.9 - 47.9 %   MCV 96.0 80.0 - 100.0 fL   MCH 31.3 26.0 - 34.0 pg   MCHC 32.6 30.0 - 36.0 g/dL   RDW 81.8 (H) 88.4 - 84.4 %   Platelet Count 173 150 - 400 K/uL   nRBC 0.0 0.0 - 0.2 %   Neutrophils Relative % 71 %   Neutro Abs 3.3 1.7 - 7.7 K/uL   Lymphocytes Relative 11 %   Lymphs Abs 0.5 (L) 0.7 - 4.0 K/uL   Monocytes Relative 16 %   Monocytes Absolute 0.8 0.1 - 1.0 K/uL   Eosinophils Relative 0 %   Eosinophils Absolute 0.0 0.0 - 0.5 K/uL   Basophils Relative 0 %   Basophils Absolute 0.0 0.0 - 0.1 K/uL   Immature Granulocytes 2 %   Abs Immature Granulocytes 0.10 (H) 0.00 - 0.07 K/uL  CMP ([name] only)  Result Value Ref Range   Sodium 140 135 - 145 mmol/L   Potassium 3.9 3.5 - 5.1 mmol/L   Chloride 105 98 - 111 mmol/L   CO2 26 22 - 32 mmol/L   Glucose, Bld 101 (H) 70 - 99 mg/dL   BUN 24 (H) 6 - 20 mg/dL   Creatinine 8.93 9.38 - 1.24 mg/dL   Calcium  9.3 8.9 - 10.3 mg/dL   Total Protein 6.3 (L) 6.5 - 8.1 g/dL   Albumin 3.6 3.5 - 5.0 g/dL   AST 42 (H) 15 -  41 U/L   ALT 47 (H) 0 - 44 U/L   Alkaline Phosphatase 147 (H) 38 - 126 U/L   Total Bilirubin 0.4 0.0 - 1.2 mg/dL   GFR, Estimated >39 >39 mL/min   Anion gap 9 5 - 15         RADIOGRAPHIC STUDIES:  MR LIVER W WO CONTRAST CLINICAL DATA:  Patient with intrahepatic cholangiocarcinoma. Nonspecific liver lesions on staging PET scan. Needs follow-up.  EXAM: MRI ABDOMEN WITHOUT AND WITH CONTRAST  TECHNIQUE: Multiplanar multisequence MR imaging of the abdomen was performed both before and after the administration of intravenous contrast.  CONTRAST:  7.5mL GADAVIST  GADOBUTROL  1 MMOL/ML IV SOLN  COMPARISON:  PET-CT scan from 09/09/2023 and MRI abdomen from 07/03/2023.  FINDINGS: Lower chest: Unremarkable MR appearance to the lung bases. No pleural effusion. No pericardial effusion. Normal heart size.  Hepatobiliary: The liver is normal in size. Redemonstration of a pre-existing heterogeneous peripherally thick irregular wall enhancing and centrally nonenhancing lesion in the right hepatic dome, segment 8 with associated capsular retraction. The lesion measures approximately 4.3 x 4.9 cm, increased in size since the prior study when it measured up to 3.1 x 3.4 cm (MRI abdomen from 07/03/2023, series 8, image 11). This is compatible with worsening metastasis.  There also multiple (marked with electronic arrow sign on series 11), similar characteristic peripherally irregular enhancing walled-off and centrally nonenhancing lesions throughout the liver (more than 20 in number) with largest in the left hepatic lobe, segment 4A measuring 2.3 x 2.4 cm, compatible with worsening metastases as well.  Redemonstration of a simple cyst in the right hepatic lobe, segment 6/7 measuring 3.7 x 3.8 cm.  Postsurgical changes again noted in the right hepatic lobe. The portal vein and its major tributaries as well as central intrahepatic veins are patent.  No intra or extrahepatic bile duct dilation. Unremarkable gallbladder which exhibit elongated appearance and wraps around the right hepatic lobe.  Pancreas: No mass, inflammatory changes or other  parenchymal abnormality identified. No main pancreatic duct dilation.  Spleen:  Within normal limits in size and appearance. No focal mass.  Adrenals/Urinary Tract: Unremarkable adrenal glands. No hydroureteronephrosis. No suspicious renal mass.  Stomach/Bowel: Visualized portions within the abdomen are unremarkable. No disproportionate dilation of bowel loops.  Vascular/Lymphatic: No pathologically enlarged lymph nodes identified. No abdominal aortic aneurysm demonstrated. No ascites.  Other:  None.  Musculoskeletal: No suspicious bone lesions identified.  IMPRESSION: 1. There is interval increase in the size of pre-existing right hepatic dome lesion as well as multiple (more than 20), new lesions throughout the liver, compatible with worsening metastases. 2. Multiple other nonacute observations, as described above.  Electronically Signed   By: Ree Molt M.D.   On: 12/07/2023 09:27    CODE STATUS:  Code Status History     Date Active Date Inactive Code Status Order ID Comments User Context   09/14/2023 1143 09/15/2023 0511 Full Code [id number]  Johann Sieving, MD HOV   08/20/2023 1401 08/23/2023 1406 Full Code [id number]  Lou Claretta HERO, MD ED   07/29/2023 1419 07/30/2023 0518 Full Code [id number]  Johann Sieving, MD HOV   07/02/2023 1533 07/05/2023 2312 Full Code [id number]  Celinda Alm Lot, MD ED   04/13/2023 1949 05/20/2023 1942 Full Code [id number]  Tony Cathaleen LABOR, PMHNP Inpatient   03/28/2023 2234 04/04/2023 1348 Full Code [id number]  Alan Thurman GAILS, NP Inpatient   03/28/2023 1136 03/28/2023 2111 Full Code [id number]  Minnie Tinnie BRAVO, PA ED   08/26/2018  1234 09/01/2018 2105 Full Code [id number]  Arloa Chroman, PA-C ED   08/25/2018 1240 08/25/2018 2000 Full Code [id number]  Little, Vernell Search, MD ED   07/31/2018 1406 08/01/2018 1446 Full Code [id number]  Zada Elouise JAYSON DEVONNA ED   07/19/2018 2045 07/21/2018 1739 DNR [id number]  [place], Eric J, [place] Inpatient   05/28/2018  1506 06/01/2018 1222 DNR [id number]  Barbarann Nest, MD Inpatient   05/20/2012 1059 05/25/2012 1606 DNR [id number]  Cherisse Ronal ORN, NP Inpatient   05/18/2012 1723 05/20/2012 1059 Full Code [id number]  Juvenal Harlene PENNER, DO Inpatient    Questions for Most Recent Historical Code Status (Order [id number])     Question Answer   By: Other            Orders Placed This Encounter  Procedures   MR LIVER W WO CONTRAST    Standing Status:   Future    Expected Date:   03/07/2024    Expiration Date:   02/14/2025    If indicated for the ordered procedure, I authorize the administration of contrast media per Radiology protocol:   Yes    What is the patient's sedation requirement?:   No Sedation    Does the patient have a pacemaker or implanted devices?:   No    Preferred imaging location?:   [name] (table limit - 500lbs)   CBC with Differential ([name] Only)    Standing Status:   Future    Expected Date:   03/28/2024    Expiration Date:   03/28/2025   CMP ([name] only)    Standing Status:   Future    Expected Date:   03/28/2024    Expiration Date:   03/28/2025     Future Appointments  Date Time Provider Department [name]  02/17/2024 12:30 PM [facility] FLUSH [facility] None  02/29/2024 10:15 AM [facility] FLUSH [facility] None  02/29/2024 11:00 AM [facility] INFUSION [facility] None  03/02/2024  2:30 PM [facility] FLUSH [facility] None     This document was completed utilizing speech recognition software. Grammatical errors, random word insertions, pronoun errors, and incomplete sentences are an occasional consequence of this system due to software limitations, ambient noise, and hardware issues. Any formal questions or concerns about the content, text or information contained within the body of this dictation should be directly addressed to the provider for clarification.

## 2024-02-16 ENCOUNTER — Encounter: Payer: Self-pay | Admitting: Oncology

## 2024-02-17 ENCOUNTER — Inpatient Hospital Stay

## 2024-02-17 ENCOUNTER — Other Ambulatory Visit: Payer: Self-pay

## 2024-02-17 VITALS — BP 130/95 | HR 90 | Resp 17

## 2024-02-17 DIAGNOSIS — C221 Intrahepatic bile duct carcinoma: Secondary | ICD-10-CM

## 2024-02-25 ENCOUNTER — Other Ambulatory Visit: Payer: Self-pay

## 2024-02-29 ENCOUNTER — Inpatient Hospital Stay

## 2024-02-29 ENCOUNTER — Telehealth: Payer: Self-pay

## 2024-02-29 DIAGNOSIS — Z5111 Encounter for antineoplastic chemotherapy: Secondary | ICD-10-CM | POA: Diagnosis not present

## 2024-02-29 DIAGNOSIS — C221 Intrahepatic bile duct carcinoma: Secondary | ICD-10-CM

## 2024-02-29 LAB — CBC WITH DIFFERENTIAL (CANCER CENTER ONLY)
Abs Immature Granulocytes: 0.11 K/uL — ABNORMAL HIGH (ref 0.00–0.07)
Basophils Absolute: 0 K/uL (ref 0.0–0.1)
Basophils Relative: 2 %
Eosinophils Absolute: 0 K/uL (ref 0.0–0.5)
Eosinophils Relative: 0 %
HCT: 39 % (ref 39.0–52.0)
Hemoglobin: 12.9 g/dL — ABNORMAL LOW (ref 13.0–17.0)
Immature Granulocytes: 6 %
Lymphocytes Relative: 26 %
Lymphs Abs: 0.5 K/uL — ABNORMAL LOW (ref 0.7–4.0)
MCH: 31.8 pg (ref 26.0–34.0)
MCHC: 33.1 g/dL (ref 30.0–36.0)
MCV: 96.1 fL (ref 80.0–100.0)
Monocytes Absolute: 0.7 K/uL (ref 0.1–1.0)
Monocytes Relative: 33 %
Neutro Abs: 0.7 K/uL — ABNORMAL LOW (ref 1.7–7.7)
Neutrophils Relative %: 33 %
Platelet Count: 120 K/uL — ABNORMAL LOW (ref 150–400)
RBC: 4.06 MIL/uL — ABNORMAL LOW (ref 4.22–5.81)
RDW: 18.6 % — ABNORMAL HIGH (ref 11.5–15.5)
WBC Count: 2 K/uL — ABNORMAL LOW (ref 4.0–10.5)
nRBC: 0 % (ref 0.0–0.2)

## 2024-02-29 LAB — CMP (CANCER CENTER ONLY)
ALT: 95 U/L — ABNORMAL HIGH (ref 0–44)
AST: 53 U/L — ABNORMAL HIGH (ref 15–41)
Albumin: 3.7 g/dL (ref 3.5–5.0)
Alkaline Phosphatase: 125 U/L (ref 38–126)
Anion gap: 10 (ref 5–15)
BUN: 22 mg/dL — ABNORMAL HIGH (ref 6–20)
CO2: 26 mmol/L (ref 22–32)
Calcium: 9 mg/dL (ref 8.9–10.3)
Chloride: 105 mmol/L (ref 98–111)
Creatinine: 1.09 mg/dL (ref 0.61–1.24)
GFR, Estimated: >60 mL/min
Glucose, Bld: 156 mg/dL — ABNORMAL HIGH (ref 70–99)
Potassium: 3.8 mmol/L (ref 3.5–5.1)
Sodium: 140 mmol/L (ref 135–145)
Total Bilirubin: 0.4 mg/dL (ref 0.0–1.2)
Total Protein: 6.2 g/dL — ABNORMAL LOW (ref 6.5–8.1)

## 2024-02-29 NOTE — Telephone Encounter (Signed)
 Left VM for pt to relay the msg from Dr Tina that his next appointments will remain as they are scheduled. No additional appointment will be scheduled with the defer in treatment today.

## 2024-02-29 NOTE — Patient Instructions (Signed)
 Neutropenia Neutropenia is a condition that occurs when you have low levels of neutrophils. Neutrophils are a type of white blood cells. They are made in the spongy center of bones (bone marrow). They fight infections. Neutrophils are your body's main defense against infections. The fewer neutrophils you have and the longer your body remains without them, the greater your risk of getting a severe infection. What are the causes? This condition can occur if your body uses up or destroys neutrophils faster than your bone marrow can make them. Neutropenia may be caused by: A bacterial or fungal infection. Allergic disorders. Reactions to some medicines. An autoimmune disease. An enlarged spleen. This condition can also occur if your bone marrow does not produce enough neutrophils. This problem may be caused by: Cancer. Cancer treatments, such as radiation or chemotherapy. Viral infections. Medicines, such as phenytoin. Vitamin B12 deficiency. Diseases of the bone marrow. Environmental toxins, such as insecticides. What are the signs or symptoms? This condition does not usually cause symptoms. If symptoms are present, they are usually caused by an underlying infection. Symptoms of an infection may include: Fever. Chills. Swollen glands. Mouth ulcers. Cough. Rash or skin infection. Skin may be red, swollen, or painful. Abdominal or rectal pain. Frequent urination or pain or burning with urination. Because neutropenia weakens the immune system, symptoms of infection may be reduced. It is important to be aware of any changes in your body and talk to your health care provider. How is this diagnosed? This condition is diagnosed based on your medical history and a physical exam. Tests will also be done, such as: A complete blood count (CBC). Bone marrow biopsy. This is collecting a sample of bone marrow for testing. A chest X-ray. A urine culture. A blood culture. How is this  treated? Treatment depends on the underlying cause and severity of your condition. Mild neutropenia may not require treatment. Treatment may include medicines, such as: Antibiotic medicine given through an IV. Antiviral medicines. Antifungal medicines. A medicine to increase production of neutrophils (colony-stimulating factor). You may get this medicine through an IV or by injection. Steroids given through an IV. If an underlying condition is causing neutropenia, you may need treatment for that condition. If medicines or cancer treatments are causing neutropenia, your health care provider may have you stop the medicines or treatment. Follow these instructions at home: Medicines  Take over-the-counter and prescription medicines only as told by your health care provider. Get an annual flu shot. Ask your health care provider whether you or anyone you live with needs any other vaccines. Eating and drinking Do not share food utensils. Do not eat unpasteurized foods. Do not eat raw or undercooked meat, eggs, or seafood. Do not eat unwashed, raw fruits or vegetables. Lifestyle Avoid exposure to groups of people or children. Avoid being around people who are sick. Avoid being around live plants or fresh flowers. Avoid being around dirt or dust, such as in construction areas or gardens. Wear gloves if you are going to do yard work or gardening. Do not provide direct care for pets. Avoid animal droppings. Do not clean litter boxes and bird cages. Do not have sex unless your health care provider has approved. Hygiene  Bathe daily. Clean the area between the genitals and the anus (perineal area) after you urinate or have a bowel movement. If you are male, wipe from front to back. Get regular dental care and brush your teeth with a soft toothbrush before and after meals. Do not use  a regular razor. Use an electric razor to remove hair. Wash your hands often with soap and water for at least 20  seconds. Make sure others who come in contact with you also wash their hands. If soap and water are not available, use hand sanitizer. General instructions Take steps to reduce your risk of injury or infection. Follow any precautions as told by your health care provider. Take actions to avoid cuts and burns. For example: Be cautious when you use knives. Always cut away from yourself. Keep knives in protective sheaths or guards when not in use. Use oven mitts when you cook with a hot stove, oven, or grill. Stand a safe distance away from open fires. Do not use tampons, enemas, or rectal suppositories unless your health care provider has approved. Keep all follow-up visits. This is important. Contact a health care provider if: You have a cough. You have a sore throat. You develop sores in your mouth or anus. You have a warm, red, or tender area on your skin. You have red streaks on the skin. You develop a rash. You have swollen lymph nodes. You have frequent or painful urination. You have vaginal discharge or itching. Get help right away if: You have a fever. You have chills or shaking. You have nausea or vomiting. You have a lot of fatigue. You have shortness of breath. Summary Neutropenia is a condition that occurs when you have a lower-than-normal level of a type of white blood cell (neutrophils) in your body. This condition can occur if your body uses up or destroys neutrophils faster than your bone marrow can make them. Treatment depends on the underlying cause and severity of your condition. Mild neutropenia may not require treatment. Follow any precautions as told by your health care provider to reduce your risk for injury or infection. This information is not intended to replace advice given to you by your health care provider. Make sure you discuss any questions you have with your health care provider. Document Revised: 08/06/2020 Document Reviewed: 08/13/2020 Elsevier Patient  Education  2025 [employer].

## 2024-03-02 ENCOUNTER — Inpatient Hospital Stay

## 2024-03-06 ENCOUNTER — Ambulatory Visit (HOSPITAL_COMMUNITY)
Admission: RE | Admit: 2024-03-06 | Discharge: 2024-03-06 | Disposition: A | Discharge status: Home / Self Care | Source: Ambulatory Visit | Attending: Oncology | Admitting: Oncology

## 2024-03-06 DIAGNOSIS — C221 Intrahepatic bile duct carcinoma: Secondary | ICD-10-CM | POA: Diagnosis present

## 2024-03-06 MED ORDER — GADOBUTROL 1 MMOL/ML IV SOLN
8.0000 mL | Freq: Once | INTRAVENOUS | Status: AC | PRN
  Administered 2024-03-06: 8 mL via INTRAVENOUS

## 2024-03-15 ENCOUNTER — Inpatient Hospital Stay

## 2024-03-15 ENCOUNTER — Inpatient Hospital Stay: Attending: Oncology | Admitting: Oncology

## 2024-03-15 ENCOUNTER — Inpatient Hospital Stay: Attending: Oncology

## 2024-03-15 ENCOUNTER — Other Ambulatory Visit: Payer: Self-pay

## 2024-03-15 VITALS — BP 151/88 | HR 85 | Temp 98.2°F | Resp 18 | Wt 193.9 lb

## 2024-03-15 DIAGNOSIS — Z79899 Other long term (current) drug therapy: Secondary | ICD-10-CM | POA: Insufficient documentation

## 2024-03-15 DIAGNOSIS — C221 Intrahepatic bile duct carcinoma: Secondary | ICD-10-CM | POA: Diagnosis not present

## 2024-03-15 DIAGNOSIS — Z5111 Encounter for antineoplastic chemotherapy: Secondary | ICD-10-CM | POA: Insufficient documentation

## 2024-03-15 LAB — CBC WITH DIFFERENTIAL (CANCER CENTER ONLY)
Abs Immature Granulocytes: 0.51 K/uL — ABNORMAL HIGH (ref 0.00–0.07)
Basophils Absolute: 0.1 K/uL (ref 0.0–0.1)
Basophils Relative: 1 %
Eosinophils Absolute: 0 K/uL (ref 0.0–0.5)
Eosinophils Relative: 0 %
HCT: 40.8 % (ref 39.0–52.0)
Hemoglobin: 13.4 g/dL (ref 13.0–17.0)
Immature Granulocytes: 4 %
Lymphocytes Relative: 4 %
Lymphs Abs: 0.5 K/uL — ABNORMAL LOW (ref 0.7–4.0)
MCH: 32.4 pg (ref 26.0–34.0)
MCHC: 32.8 g/dL (ref 30.0–36.0)
MCV: 98.6 fL (ref 80.0–100.0)
Monocytes Absolute: 0.4 K/uL (ref 0.1–1.0)
Monocytes Relative: 3 %
Neutro Abs: 10.8 K/uL — ABNORMAL HIGH (ref 1.7–7.7)
Neutrophils Relative %: 88 %
Platelet Count: 175 K/uL (ref 150–400)
RBC: 4.14 MIL/uL — ABNORMAL LOW (ref 4.22–5.81)
RDW: 18.7 % — ABNORMAL HIGH (ref 11.5–15.5)
WBC Count: 12.4 K/uL — ABNORMAL HIGH (ref 4.0–10.5)
nRBC: 0 % (ref 0.0–0.2)

## 2024-03-15 LAB — CMP (CANCER CENTER ONLY)
ALT: 97 U/L — ABNORMAL HIGH (ref 0–44)
AST: 79 U/L — ABNORMAL HIGH (ref 15–41)
Albumin: 3.7 g/dL (ref 3.5–5.0)
Alkaline Phosphatase: 172 U/L — ABNORMAL HIGH (ref 38–126)
Anion gap: 11 (ref 5–15)
BUN: 26 mg/dL — ABNORMAL HIGH (ref 6–20)
CO2: 26 mmol/L (ref 22–32)
Calcium: 9.4 mg/dL (ref 8.9–10.3)
Chloride: 103 mmol/L (ref 98–111)
Creatinine: 1.09 mg/dL (ref 0.61–1.24)
GFR, Estimated: >60 mL/min
Glucose, Bld: 187 mg/dL — ABNORMAL HIGH (ref 70–99)
Potassium: 4.2 mmol/L (ref 3.5–5.1)
Sodium: 140 mmol/L (ref 135–145)
Total Bilirubin: 0.4 mg/dL (ref 0.0–1.2)
Total Protein: 6.6 g/dL (ref 6.5–8.1)

## 2024-03-15 MED ORDER — SODIUM CHLORIDE 0.9 % IV SOLN
2400.0000 mg/m2 | INTRAVENOUS | Status: DC
  Administered 2024-03-15: 5000 mg via INTRAVENOUS
  Filled 2024-03-15: qty 100

## 2024-03-15 MED ORDER — PALONOSETRON HCL INJECTION 0.25 MG/5ML
0.2500 mg | Freq: Once | INTRAVENOUS | Status: AC
  Administered 2024-03-15: 0.25 mg via INTRAVENOUS
  Filled 2024-03-15: qty 5

## 2024-03-15 MED ORDER — DEXAMETHASONE SOD PHOSPHATE PF 10 MG/ML IJ SOLN
10.0000 mg | Freq: Once | INTRAMUSCULAR | Status: AC
  Administered 2024-03-15: 10 mg via INTRAVENOUS
  Filled 2024-03-15: qty 1

## 2024-03-15 MED ORDER — OXALIPLATIN CHEMO INJECTION 100 MG/20ML
85.0000 mg/m2 | Freq: Once | INTRAVENOUS | Status: AC
  Administered 2024-03-15: 170 mg via INTRAVENOUS
  Filled 2024-03-15: qty 2.72

## 2024-03-15 MED ORDER — LEUCOVORIN CALCIUM INJECTION 350 MG
400.0000 mg/m2 | Freq: Once | INTRAVENOUS | Status: AC
  Administered 2024-03-15: 808 mg via INTRAVENOUS
  Filled 2024-03-15: qty 40.4

## 2024-03-15 MED ORDER — DEXTROSE 5 % IV SOLN: INTRAVENOUS | Status: DC

## 2024-03-15 MED ORDER — FLUOROURACIL CHEMO INJECTION 2.5 GM/50ML
400.0000 mg/m2 | Freq: Once | INTRAVENOUS | Status: AC
  Administered 2024-03-15: 800 mg via INTRAVENOUS
  Filled 2024-03-15: qty 16

## 2024-03-15 NOTE — Patient Instructions (Signed)
 [facility] - A DEPT OF [place]  Discharge Instructions: Thank you for choosing [facility] to provide your oncology and hematology care.   If you have a lab appointment with the Cancer Center, please go directly to the Cancer Center and check in at the registration area.   Wear comfortable clothing and clothing appropriate for easy access to any Portacath or PICC line.   We strive to give you quality time with your provider. You may need to reschedule your appointment if you arrive late (15 or more minutes).  Arriving late affects you and other patients whose appointments are after yours.  Also, if you miss three or more appointments without notifying the office, you may be dismissed from the clinic at the providers discretion.      For prescription refill requests, have your pharmacy contact our office and allow 72 hours for refills to be completed.    Today you received the following chemotherapy and/or immunotherapy agents: Oxaliplatin  (Eloxatin ), Leucovorin , Fluorouracil  (Adrucil , 5-FU)      To help prevent nausea and vomiting after your treatment, we encourage you to take your nausea medication as directed.  BELOW ARE SYMPTOMS THAT SHOULD BE REPORTED IMMEDIATELY: *FEVER GREATER THAN 100.4 F (38 C) OR HIGHER *CHILLS OR SWEATING *NAUSEA AND VOMITING THAT IS NOT CONTROLLED WITH YOUR NAUSEA MEDICATION *UNUSUAL SHORTNESS OF BREATH *UNUSUAL BRUISING OR BLEEDING *URINARY PROBLEMS (pain or burning when urinating, or frequent urination) *BOWEL PROBLEMS (unusual diarrhea, constipation, pain near the anus) TENDERNESS IN MOUTH AND THROAT WITH OR WITHOUT PRESENCE OF ULCERS (sore throat, sores in mouth, or a toothache) UNUSUAL RASH, SWELLING OR PAIN  UNUSUAL VAGINAL DISCHARGE OR ITCHING   Items with * indicate a potential emergency and should be followed up as soon as possible or go to the Emergency Department if any problems should  occur.  Please show the CHEMOTHERAPY ALERT CARD or IMMUNOTHERAPY ALERT CARD at check-in to the Emergency Department and triage nurse.  Should you have questions after your visit or need to cancel or reschedule your appointment, please contact [facility] - A DEPT OF [facility]  Dept: [phone]  and follow the prompts.  Office hours are 8:00 a.m. to 4:30 p.m. Monday - Friday. Please note that voicemails left after 4:00 p.m. may not be returned until the following business day.  We are closed weekends and major holidays. You have access to a nurse at all times for urgent questions. Please call the main number to the clinic Dept: [phone] and follow the prompts.   For any non-urgent questions, you may also contact your provider using MyChart. We now offer e-Visits for anyone 38 and older to request care online for non-urgent symptoms. For details visit [email or web address].   Also download the MyChart app! Go to the app store, search MyChart, open the app, select [facility], and log in with your MyChart username and password.

## 2024-03-15 NOTE — Progress Notes (Signed)
 Per Dr. Autumn: OK to treat with elevated liver enzymes (AST: 79, ALT: 97, alk phos: 172)

## 2024-03-15 NOTE — Progress Notes (Signed)
 "  [facility]  ONCOLOGY CLINIC PROGRESS NOTE   Patient Care Team: [name], MD as PCP - General (Family Medicine) Avram Lupita BRAVO, MD as Attending Physician (Gastroenterology) Harvey Carlin BRAVO, MD (Inactive) as Attending Physician (Vascular Surgery) Ardis Evalene CROME, RN as Oncology Nurse Navigator Lyfe Monger, Chinita, MD as Consulting Physician (Hematology and Oncology)  PATIENT NAME: [name]   MR#: [id number] DOB: [birth year]  Date of visit: 03/15/2024   ASSESSMENT & PLAN:   [name] is a 59 y.o.  gentleman with a past medical history of primary sclerosing cholangitis, autoimmune hepatitis, Crohn's disease, anxiety/depression, PTSD, past C. difficile colitis, chronic pain syndrome, chronic mesenteric ischemia, basal cell carcinoma of left lower extremity, history of suicide attempt, past portal vein thrombosis, was referred to our clinic in June 2025 for newly diagnosed intrahepatic cholangiocarcinoma.  cT2, cN0, cM1.  Intrahepatic cholangiocarcinoma ([name]) Please review oncology history for additional details and timeline of events.    Intrahepatic cholangiocarcinoma, diagnosed following MRI and liver biopsy in May 2025.  He underwent ultrasound-guided biopsy of liver lesion on 07/29/2023.  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 CDX-2 positive, CK20 positive, CK7 positive, synaptophysin negative, chromogranin negative.  IHC profile suggested a gastrointestinal or pancreaticobiliary primary.  Clinical picture is consistent with intrahepatic cholangiocarcinoma, primarily located in the right lobe of the liver with disease limited to the liver area. A new small lesion on the dome of the left side of the liver noted on PET scan and also recent CT scan.  Too small to be biopsied.  Plan made to monitor this on future scans.   His case was previously discussed in GI  multidisciplinary tumor conference. 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  Given history of autoimmune hepatitis, primary sclerosing cholangitis, he would not be a candidate for immunotherapy.  Hence plan would be to proceed with chemotherapy using cisplatin  and gemcitabine .  Following drainage of pelvic/anal abscess, once we have obtained clearance from the surgery department, and once his C. difficile was cleared, he was started on systemic chemotherapy with cisplatin  and gemcitabine  from 09/28/2023.  Plan was to continue once weekly for two weeks on, one week off, depending on tolerance.   Following completion of 3 cycles of chemotherapy, restaging MRI of the abdomen on 12/07/2023 showed progressive disease with diffuse mets in the liver.  Discussed results with the patient in detail previously.  Clinically he seemed to have improved compared to prior.  This was an unfortunate development.  Reviewed NCCN guidelines and discussed treatment options.  Proposed switching treatments to FOLFOX.  Discussed side effect profile including diarrhea, hand-foot syndrome with 5-FU and cold sensitivity, progressive neuropathy with oxaliplatin  in addition to other side effects.  Patient verbalized understanding and gave consent to proceed with this treatment option.  Proceeded with cycle 1 of FOLFOX from 12/14/2023.  Plan is to continue this every 2 weeks with restaging imaging in approximately 3 months from the time we switched treatments.  Tolerated cycle 1 of FOLFOX reasonably well.  He did develop a rash on his face, which he had previously also.  Recommended avoiding sun exposure, aloe vera cream with topical cortisone.  He did not experience rash after cycle 2.  Cycle 3 was deferred until 02/01/24 as patient had surgery for fistula repair that is related to his Crohn's on 01/13/2024.  Recovered well from  surgical  procedure.  Following 4 cycles of FOLFOX, restaging MRI of the liver on 03/06/2024 showed mixed response with overall stable disease.  Plan to continue FOLFOX for now.  He was also referred to [facility], Dr. Debora for consideration of liver directed therapy vs HAI pump therapy consideration. The presence of primary sclerosing cholangitis may complicate eligibility for certain liver-directed interventions.  Labs today show no dose-limiting toxicities.  Will proceed with cycle 5 of FOLFOX as scheduled.    - Monitor blood counts, liver function, and for side effects such as nausea, fatigue, hand-foot syndrome, diarrhea and neuropathy. - Advise on precautions with cold sensitivity: eat and drink at room temperature, wear gloves when handling cold items.  - Monitor CA 19-9 and CEA levels periodically.  Labs requested for next visit.  RTC in 2 weeks for cycle 6 of FOLFOX.    Primary sclerosing cholangitis Primary sclerosing cholangitis may impact eligibility for liver-directed therapies for intrahepatic cholangiocarcinoma. This comorbidity was considered in the referral process. - Included primary sclerosing cholangitis in referral to [facility] for specialist input regarding liver-directed therapies.  History of Clostridioides difficile infection Clostridioides difficile infection has resolved.   I reviewed lab results and outside records for this visit and discussed relevant results with the patient. Diagnosis, plan of care and treatment options were also discussed in detail with the patient. Opportunity provided to ask questions and answers provided to his apparent satisfaction. Provided instructions to call our clinic with any problems, questions or concerns prior to return visit. I recommended to continue follow-up with PCP and sub-specialists. He verbalized understanding and agreed with the plan.   NCCN guidelines have been consulted in the planning of this  patients care.  I spent a total of 42 minutes during this encounter with the patient including review of chart and various tests results, discussions about plan of care and coordination of care plan.   Chinita Patten, MD  03/15/2024  5:44 PM   [facility] - [facility] [address] [place] [address] Dept: [phone] Dept Fax: [phone]    CHIEF COMPLAINT/ REASON FOR VISIT:   Intrahepatic cholangiocarcinoma, diagnosed in June 2025.  Current Treatment: Plan made for systemic treatment with cisplatin  and gemcitabine , followed by surgical evaluation.  Started on systemic chemotherapy with cisplatin  and gemcitabine  from 09/28/2023.  Immunotherapy avoided because of his history of autoimmune hepatitis and primary sclerosing cholangitis.  Following completion of 3 cycles of chemotherapy, restaging MRI of the abdomen on 12/07/2023 showed progressive disease with diffuse mets in the liver.  Treatment switched to FOLFOX from 12/14/2023.  INTERVAL HISTORY:    Discussed the use of AI scribe software for clinical note transcription with the patient, who gave verbal consent to proceed.  History of Present Illness Xzavior Reinig is a 59 year old male with intrahepatic cholangiocarcinoma and primary sclerosing cholangitis presenting for oncology follow-up and ongoing systemic therapy.  He is undergoing systemic therapy for intrahepatic cholangiocarcinoma. Recent MRI showed the largest hepatic lesion has decreased in size and several smaller lesions have increased in size. He remains asymptomatic from his malignancy, with no new abdominal pain, jaundice, constitutional symptoms, gastrointestinal complaints, or weight loss. He reports good oral intake and current weight is 190 lbs.  Two weeks ago, treatment was held due to neutropenia (ANC 700). He previously received Neulasta  at therapy initiation,  which caused mild transient symptoms. Blood counts have improved since the  last cycle.  He has a history of primary sclerosing cholangitis. Liver function tests show fluctuating alkaline phosphatase and mildly elevated AST/ALT.  He experiences lower back pain with prolonged driving, which influences his preference for referral to [facility] for consideration of liver-directed therapies. He has no ongoing issues with prior abscesses or new infectious symptoms. He feels well overall.   I have reviewed the past medical history, past surgical history, social history and family history with the patient and they are unchanged from previous note.  HISTORY OF PRESENT ILLNESS:   ONCOLOGY HISTORY:   He has a history of autoimmune hepatitis, primary sclerosing cholangitis diagnosed in 2012, and Crohn's disease for which he is currently taking prednisone  and Entyvio .   He was admitted to the hospital on 07/02/2023 after he presented with complaints of multiple episodes of diarrhea, dizziness, near syncope.  CT scan of the abdomen and pelvis on 07/02/2023 showed new 3.7 x 2.8 cm noncystic lesion in the dome of the liver, which was not present on prior studies.  Given history of primary sclerosing cholangitis, there was concern for cholangiocarcinoma and MRI was recommended.   On 07/03/2023, MRI of the abdomen showed 4.3 x 2.8 x 3.7 cm lesion in the lateral aspect of the hepatic dome which showed heterogeneous enhancement after IV contrast administration, suspicious for neoplasm.  Cholangiocarcinoma was considered because of his history of primary sclerosing cholangitis.  Inferior and posterior to this lesion was a cyst measuring 5 x 4.5 cm, slightly increased in size compared to previous MRI.  Bile duct showed variable dilatation and stenosis.   Patient did not want to stay in the hospital for biopsy at that time.   He underwent ultrasound-guided biopsy of liver lesion on 07/29/2023.  Pathology showed  poorly differentiated adenocarcinoma with signet ring cell features, favoring a metastatic lesion. CDX-2 positive, CK20 positive, CK7 positive, synaptophysin negative, chromogranin negative.  IHC profile suggested a gastrointestinal or pancreaticobiliary primary.   To look for a primary, patient underwent upper endoscopy and colonoscopy on 08/18/2023, by Dr. Avram.  EGD showed evidence of gastritis, single gastric polyp.  Otherwise unremarkable.  Colonoscopy showed several polyps in the transverse colon, sigmoid colon, descending colon.  Evidence of diverticulosis in the sigmoid colon.  Multiple biopsies were obtained.  Biopsies from the stomach, colon, cecum all showed benign findings.  No evidence of malignancy noted.   Clinical picture is consistent with intrahepatic cholangiocarcinoma.   Given history of autoimmune hepatitis, primary sclerosing cholangitis, he would not be a candidate for immunotherapy.  Hence plan would be to proceed with chemotherapy using cisplatin  and gemcitabine .   Request submitted for NGS testing on the specimen and liquid biopsy.  Liquid biopsy showed no actionable mutations.   On 09/09/2023, staging PET scan showed marginal areas of uptake along the mass lesion in the posterior superior right hepatic lobe, corresponding to the area of known neoplasm.  There were small cystic areas elsewhere in the liver, new small low-density focus in the dome of left hepatic lobe.  No appreciable abnormal uptake.  No areas of abnormal uptake seen above the diaphragm.  Findings consistent with anal fistula noted.  His case was previously discussed in GI multidisciplinary tumor conference.  Dr. Dasie felt that he can be a surgical candidate following neoadjuvant chemotherapy.  Following drainage of pelvic/anal abscess, once we have obtained clearance from the surgery department, and once his C. difficile was cleared, he was started on systemic chemotherapy with cisplatin  and gemcitabine   from  09/28/2023.  Plan made to continue once weekly for two weeks on, one week off, depending on tolerance.   Following completion of 3 cycles of chemotherapy, restaging MRI of the abdomen on 12/07/2023 showed progressive disease with diffuse mets in the liver.  Treatment switched to FOLFOX from 12/14/2023.  Following 4 cycles of FOLFOX, restaging MRI of the liver on 03/06/2024 showed mixed response with overall stable disease.  Plan to continue FOLFOX for now.  He was also referred to [facility], Dr. Debora for consideration of liver directed therapy vs HAI pump therapy consideration.    Oncology History  Intrahepatic cholangiocarcinoma ([name])  08/18/2023 Initial Diagnosis   Cholangiocarcinoma metastatic to liver ([name])   08/26/2023 Cancer Staging   Staging form: Intrahepatic Bile Duct, AJCC 8th Edition - Clinical: Stage IV (cT2, cN0, cM1) - Signed by Autumn Millman, MD on 12/14/2023   09/28/2023 - 11/30/2023 Chemotherapy   Patient is on Treatment Plan : BILIARY TRACT Cisplatin  + Gemcitabine  D1,8 q21d     12/14/2023 -  Chemotherapy   Patient is on Treatment Plan : FOLFOX q14d         REVIEW OF SYSTEMS:   Review of Systems - Oncology  All other pertinent systems were reviewed with the patient and are negative.  ALLERGIES: He is allergic to tape, asacol  [mesalamine ], azathioprine, gluten meal, metoprolol  tartrate, mycophenolate mofetil, other, ciprofloxacin , keflex [cephalexin], abilify  [aripiprazole ], and amoxicillin .  MEDICATIONS:  Current Outpatient Medications  Medication Sig Dispense Refill   Cholecalciferol 125 MCG (5000 UT) TABS Take 1 tablet by mouth daily.     ferrous sulfate  325 (65 FE) MG tablet Take 325 mg by mouth daily with breakfast. Continues to take per low HGB     hydrOXYzine  (ATARAX ) 25 MG tablet Take 1 tablet (25 mg total) by mouth 3 (three) times daily as needed for anxiety. 30 tablet 0   melatonin 3 MG TABS tablet Take 3 mg by mouth at bedtime.      mirtazapine  (REMERON ) 30 MG tablet Take 0.5 tablets (15 mg total) by mouth at bedtime. (Patient taking differently: Take 30 mg by mouth at bedtime.)     ondansetron  (ZOFRAN ) 8 MG tablet Take 1 tablet (8 mg total) by mouth every 8 (eight) hours as needed for nausea or vomiting. Start on the third day after chemotherapy. 30 tablet 1   Potassium 99 MG TABS Take 1 tablet by mouth daily. (Patient taking differently: Take 1 tablet by mouth daily. Patient reported he takes this once a week.)     predniSONE  (DELTASONE ) 20 MG tablet Take 1 tablet (20 mg total) by mouth daily with breakfast. 90 tablet 1   Vedolizumab  (ENTYVIO  IV) Inject into the vein every 6 (six) weeks.     venlafaxine  XR (EFFEXOR -XR) 150 MG 24 hr capsule Take 1 capsule (150 mg total) by mouth daily with breakfast. 30 capsule 0   venlafaxine  XR (EFFEXOR -XR) 75 MG 24 hr capsule Take 75 mg by mouth daily.     lidocaine -prilocaine  (EMLA ) cream Apply to affected area once (Patient not taking: Reported on 03/15/2024) 30 g 3   Magnesium  125 MG CAPS One at bedtime (Patient not taking: Reported on 03/15/2024)     metroNIDAZOLE  (FLAGYL ) 500 MG tablet Take 500 mg by mouth 2 (two) times daily.     Nutritional Supplements (ENSURE HIGH PROTEIN) LIQD Take 237 mLs by mouth in the morning and at bedtime.     prochlorperazine  (COMPAZINE ) 10 MG tablet Take 1 tablet (10 mg total)  by mouth every 6 (six) hours as needed for nausea or vomiting. (Patient not taking: Reported on 03/15/2024) 30 tablet 1   No current facility-administered medications for this visit.     VITALS:   Blood pressure (!) 151/88, pulse 85, temperature 98.2 F (36.8 C), temperature source Temporal, resp. rate 18, weight 193 lb 14.4 oz (88 kg), SpO2 99%.  Wt Readings from Last 3 Encounters:  03/15/24 193 lb 14.4 oz (88 kg)  02/29/24 186 lb 12 oz (84.7 kg)  02/15/24 182 lb 1.6 oz (82.6 kg)    Body mass index is 25.24 kg/m.    Onc Performance Status - 03/15/24 1040       ECOG Perf  Status   ECOG Perf Status Restricted in physically strenuous activity but ambulatory and able to carry out work of a light or sedentary nature, e.g., light house work, office work      KPS SCALE   KPS % SCORE Able to carry on normal activity, minor s/s of disease          PHYSICAL EXAM:   Physical Exam Constitutional:      General: He is not in acute distress.    Appearance: Normal appearance.  HENT:     Head: Normocephalic and atraumatic.  Eyes:     Conjunctiva/sclera: Conjunctivae normal.  Cardiovascular:     Rate and Rhythm: Normal rate and regular rhythm.     Heart sounds: Normal heart sounds.  Pulmonary:     Effort: Pulmonary effort is normal. No respiratory distress.     Breath sounds: Normal breath sounds.  Chest:     Comments: Port-A-Cath in place without any signs of infection Abdominal:     General: There is no distension.  Skin:    Findings: Rash: Resolved.  Neurological:     General: No focal deficit present.     Mental Status: He is alert and oriented to person, place, and time.  Psychiatric:        Mood and Affect: Mood normal.        Behavior: Behavior normal.       LABORATORY DATA:   I have reviewed the data as listed.  Results for orders placed or performed in visit on 03/15/24  CMP (Cancer Center only)  Result Value Ref Range   Sodium 140 135 - 145 mmol/L   Potassium 4.2 3.5 - 5.1 mmol/L   Chloride 103 98 - 111 mmol/L   CO2 26 22 - 32 mmol/L   Glucose, Bld 187 (H) 70 - 99 mg/dL   BUN 26 (H) 6 - 20 mg/dL   Creatinine 8.90 9.38 - 1.24 mg/dL   Calcium  9.4 8.9 - 10.3 mg/dL   Total Protein 6.6 6.5 - 8.1 g/dL   Albumin 3.7 3.5 - 5.0 g/dL   AST 79 (H) 15 - 41 U/L   ALT 97 (H) 0 - 44 U/L   Alkaline Phosphatase 172 (H) 38 - 126 U/L   Total Bilirubin 0.4 0.0 - 1.2 mg/dL   GFR, Estimated >39 >39 mL/min   Anion gap 11 5 - 15  CBC with Differential (Cancer Center Only)  Result Value Ref Range   WBC Count 12.4 (H) 4.0 - 10.5 K/uL   RBC 4.14 (L)  4.22 - 5.81 MIL/uL   Hemoglobin 13.4 13.0 - 17.0 g/dL   HCT 59.1 60.9 - 47.9 %   MCV 98.6 80.0 - 100.0 fL   MCH 32.4 26.0 - 34.0 pg   MCHC 32.8 30.0 -  36.0 g/dL   RDW 81.2 (H) 88.4 - 84.4 %   Platelet Count 175 150 - 400 K/uL   nRBC 0.0 0.0 - 0.2 %   Neutrophils Relative % 88 %   Neutro Abs 10.8 (H) 1.7 - 7.7 K/uL   Lymphocytes Relative 4 %   Lymphs Abs 0.5 (L) 0.7 - 4.0 K/uL   Monocytes Relative 3 %   Monocytes Absolute 0.4 0.1 - 1.0 K/uL   Eosinophils Relative 0 %   Eosinophils Absolute 0.0 0.0 - 0.5 K/uL   Basophils Relative 1 %   Basophils Absolute 0.1 0.0 - 0.1 K/uL   Immature Granulocytes 4 %   Abs Immature Granulocytes 0.51 (H) 0.00 - 0.07 K/uL      RADIOGRAPHIC STUDIES:  MR LIVER W WO CONTRAST CLINICAL DATA:  Restaging intrahepatic cholangiocarcinoma  EXAM: MRI ABDOMEN WITHOUT AND WITH CONTRAST  TECHNIQUE: Multiplanar multisequence MR imaging of the abdomen was performed both before and after the administration of intravenous contrast.  CONTRAST:  8mL GADAVIST  GADOBUTROL  1 MMOL/ML IV SOLN  COMPARISON:  12/07/2023  FINDINGS: Lower chest: No acute abnormality.  Hepatobiliary: Numerous hypoenhancing liver lesions again seen. Dominant lesion in the liver dome is perhaps slightly diminished in size, measuring 5.5 x 4.1 cm, previously 5.9 x 4.3 cm. The remaining lesions are however somewhat increased in size, for example in the anterior left lobe of the liver measuring 2.6 x 2.2 cm, previously 1.9 x 1.5 cm (series 21, image 12). No gallstones, gallbladder wall thickening, or biliary dilatation.  Pancreas: Unremarkable. No pancreatic ductal dilatation or surrounding inflammatory changes.  Spleen: Normal in size without significant abnormality.  Adrenals/Urinary Tract: Adrenal glands are unremarkable. Kidneys are normal, without renal calculi, solid lesion, or hydronephrosis.  Stomach/Bowel: Stomach is within normal limits. No evidence of bowel wall  thickening, distention, or inflammatory changes.  Vascular/Lymphatic: Aortic atherosclerosis. No enlarged abdominal lymph nodes.  Other: No abdominal wall hernia or abnormality. No ascites.  Musculoskeletal: No acute or significant osseous findings.  IMPRESSION: 1. Numerous hypoenhancing liver lesions again seen. Dominant lesion in the liver dome is perhaps slightly diminished in size, measuring 5.5 x 4.1 cm, previously 5.9 x 4.3 cm. The remaining lesions are however somewhat increased in size, for example in the anterior left lobe of the liver measuring 2.6 x 2.2 cm, previously 1.9 x 1.5 cm. Findings consistent with worsened cholangiocarcinoma and intrahepatic metastatic disease. 2. No other evidence of metastatic disease in the abdomen.  Electronically Signed   By: Marolyn JONETTA Jaksch M.D.   On: 03/08/2024 09:09    CODE STATUS:  Code Status History     Date Active Date Inactive Code Status Order ID Comments User Context   09/14/2023 1143 09/15/2023 0511 Full Code [id number]  Johann Sieving, MD HOV   08/20/2023 1401 08/23/2023 1406 Full Code [id number]  Lou Claretta HERO, MD ED   07/29/2023 1419 07/30/2023 0518 Full Code [id number]  Johann Sieving, MD HOV   07/02/2023 1533 07/05/2023 2312 Full Code [id number]  Celinda Alm Lot, MD ED   04/13/2023 1949 05/20/2023 1942 Full Code [id number]  Tony Cathaleen LABOR, PMHNP Inpatient   03/28/2023 2234 04/04/2023 1348 Full Code [id number]  Alan Thurman GAILS, NP Inpatient   03/28/2023 1136 03/28/2023 2111 Full Code [id number]  Minnie Tinnie BRAVO, PA ED   08/26/2018 1234 09/01/2018 2105 Full Code [id number]  Arloa Chroman, PA-C ED   08/25/2018 1240 08/25/2018 2000 Full Code [id number]  Little, Vernell Search, MD ED   07/31/2018 1406 08/01/2018 1446 Full  Code [id number]  Zada Elouise JAYSON DEVONNA ED   07/19/2018 2045 07/21/2018 1739 DNR [id number]  [place], Eric J, [place] Inpatient   05/28/2018 1506 06/01/2018 1222 DNR [id number]  Barbarann Nest, MD Inpatient   05/20/2012 1059  05/25/2012 1606 DNR [id number]  Cherisse Ronal ORN, NP Inpatient   05/18/2012 1723 05/20/2012 1059 Full Code [id number]  Juvenal Harlene PENNER, DO Inpatient    Questions for Most Recent Historical Code Status (Order [id number])     Question Answer   By: Other            Orders Placed This Encounter  Procedures   Cancer Antigen 19-9    Standing Status:   Future    Expected Date:   03/29/2024    Expiration Date:   03/29/2025   CEA (Access)    Standing Status:   Future    Expected Date:   03/29/2024    Expiration Date:   03/29/2025   CBC with Differential (Cancer Center Only)    Standing Status:   Future    Expected Date:   04/26/2024    Expiration Date:   04/26/2025   CMP (Cancer Center only)    Standing Status:   Future    Expected Date:   04/26/2024    Expiration Date:   04/26/2025   CBC with Differential (Cancer Center Only)    Standing Status:   Future    Expected Date:   03/29/2024    Expiration Date:   03/29/2025   CMP (Cancer Center only)    Standing Status:   Future    Expected Date:   03/29/2024    Expiration Date:   03/29/2025     Future Appointments  Date Time Provider Department Center  03/17/2024 12:45 PM [facility] FLUSH [facility] None  03/29/2024 11:30 AM [facility] FLUSH [facility] None  03/29/2024 11:45 AM Meriam Chojnowski, MD [facility] None  03/29/2024 12:30 PM [facility] INFUSION [facility] None  03/31/2024  2:00 PM [facility] FLUSH [facility] None     This document was completed utilizing speech recognition software. Grammatical errors, random word insertions, pronoun errors, and incomplete sentences are an occasional consequence of this system due to software limitations, ambient noise, and hardware issues. Any formal questions or concerns about the content, text or information contained within the body of this dictation should be directly addressed to the provider for clarification.   "

## 2024-03-16 ENCOUNTER — Encounter: Payer: Self-pay | Admitting: Oncology

## 2024-03-16 NOTE — Assessment & Plan Note (Addendum)
 Please review oncology history for additional details and timeline of events.    Intrahepatic cholangiocarcinoma, diagnosed following MRI and liver biopsy in May 2025.  He underwent ultrasound-guided biopsy of liver lesion on 07/29/2023.  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 CDX-2 positive, CK20 positive, CK7 positive, synaptophysin negative, chromogranin negative.  IHC profile suggested a gastrointestinal or pancreaticobiliary primary.  Clinical picture is consistent with intrahepatic cholangiocarcinoma, primarily located in the right lobe of the liver with disease limited to the liver area. A new small lesion on the dome of the left side of the liver noted on PET scan and also recent CT scan.  Too small to be biopsied.  Plan made to monitor this on future scans.   His case was previously discussed in GI multidisciplinary tumor conference. 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  Given history of autoimmune hepatitis, primary sclerosing cholangitis, he would not be a candidate for immunotherapy.  Hence plan would be to proceed with chemotherapy using cisplatin  and gemcitabine .  Following drainage of pelvic/anal abscess, once we have obtained clearance from the surgery department, and once his C. difficile was cleared, he was started on systemic chemotherapy with cisplatin  and gemcitabine  from 09/28/2023.  Plan was to continue once weekly for two weeks on, one week off, depending on tolerance.   Following completion of 3 cycles of chemotherapy, restaging MRI of the abdomen on 12/07/2023 showed progressive disease with diffuse mets in the liver.  Discussed results with the patient in detail previously.  Clinically he seemed to have improved compared to prior.  This was an unfortunate development.  Reviewed [facility] guidelines  and discussed treatment options.  Proposed switching treatments to FOLFOX.  Discussed side effect profile including diarrhea, hand-foot syndrome with 5-FU and cold sensitivity, progressive neuropathy with oxaliplatin  in addition to other side effects.  Patient verbalized understanding and gave consent to proceed with this treatment option.  Proceeded with cycle 1 of FOLFOX from 12/14/2023.  Plan is to continue this every 2 weeks with restaging imaging in approximately 3 months from the time we switched treatments.  Tolerated cycle 1 of FOLFOX reasonably well.  He did develop a rash on his face, which he had previously also.  Recommended avoiding sun exposure, aloe vera cream with topical cortisone.  He did not experience rash after cycle 2.  Cycle 3 was deferred until 02/01/24 as patient had surgery for fistula repair that is related to his Crohn's on 01/13/2024.  Recovered well from surgical procedure.  Following 4 cycles of FOLFOX, restaging MRI of the liver on 03/06/2024 showed mixed response with overall stable disease.  Plan to continue FOLFOX for now.  He was also referred to [facility], Dr. Debora for consideration of liver directed therapy vs HAI pump therapy consideration. The presence of primary sclerosing cholangitis may complicate eligibility for certain liver-directed interventions.  Labs today show no dose-limiting toxicities.  Will proceed with cycle 5 of FOLFOX as scheduled.    - Monitor blood counts, liver function, and for side effects such as nausea, fatigue, hand-foot syndrome, diarrhea and neuropathy. - Advise on precautions with cold sensitivity: eat and drink at room temperature, wear gloves when handling cold items.  - Monitor CA 19-9 and CEA levels periodically.  Labs requested for next visit.  RTC in 2 weeks for cycle 6 of FOLFOX.

## 2024-03-17 ENCOUNTER — Inpatient Hospital Stay

## 2024-03-19 DIAGNOSIS — C221 Intrahepatic bile duct carcinoma: Secondary | ICD-10-CM

## 2024-03-19 NOTE — Progress Notes (Signed)
 Referral faxed to Dr. Abran Kallman ([facility]) per Dr. Clovis request

## 2024-03-22 ENCOUNTER — Other Ambulatory Visit: Payer: Self-pay

## 2024-03-29 ENCOUNTER — Inpatient Hospital Stay

## 2024-03-29 ENCOUNTER — Encounter: Payer: Self-pay | Admitting: Oncology

## 2024-03-29 ENCOUNTER — Inpatient Hospital Stay: Admitting: Oncology

## 2024-03-29 VITALS — BP 156/91 | HR 87 | Temp 97.9°F | Resp 18 | Ht 73.5 in | Wt 196.6 lb

## 2024-03-29 DIAGNOSIS — C221 Intrahepatic bile duct carcinoma: Secondary | ICD-10-CM

## 2024-03-29 LAB — CEA (ACCESS): CEA (CHCC): 87.14 ng/mL — ABNORMAL HIGH (ref 0.00–5.00)

## 2024-03-29 LAB — CMP (CANCER CENTER ONLY)
ALT: 91 U/L — ABNORMAL HIGH (ref 0–44)
AST: 68 U/L — ABNORMAL HIGH (ref 15–41)
Albumin: 3.7 g/dL (ref 3.5–5.0)
Alkaline Phosphatase: 153 U/L — ABNORMAL HIGH (ref 38–126)
Anion gap: 12 (ref 5–15)
BUN: 25 mg/dL — ABNORMAL HIGH (ref 6–20)
CO2: 24 mmol/L (ref 22–32)
Calcium: 8.8 mg/dL — ABNORMAL LOW (ref 8.9–10.3)
Chloride: 103 mmol/L (ref 98–111)
Creatinine: 1.14 mg/dL (ref 0.61–1.24)
GFR, Estimated: >60 mL/min
Glucose, Bld: 142 mg/dL — ABNORMAL HIGH (ref 70–99)
Potassium: 4.4 mmol/L (ref 3.5–5.1)
Sodium: 138 mmol/L (ref 135–145)
Total Bilirubin: 0.4 mg/dL (ref 0.0–1.2)
Total Protein: 6.2 g/dL — ABNORMAL LOW (ref 6.5–8.1)

## 2024-03-29 LAB — CBC WITH DIFFERENTIAL (CANCER CENTER ONLY)
Abs Immature Granulocytes: 0.09 10*3/uL — ABNORMAL HIGH (ref 0.00–0.07)
Basophils Absolute: 0 10*3/uL (ref 0.0–0.1)
Basophils Relative: 0 %
Eosinophils Absolute: 0 10*3/uL (ref 0.0–0.5)
Eosinophils Relative: 0 %
HCT: 39.4 % (ref 39.0–52.0)
Hemoglobin: 13.1 g/dL (ref 13.0–17.0)
Immature Granulocytes: 1 %
Lymphocytes Relative: 5 %
Lymphs Abs: 0.4 10*3/uL — ABNORMAL LOW (ref 0.7–4.0)
MCH: 32.8 pg (ref 26.0–34.0)
MCHC: 33.2 g/dL (ref 30.0–36.0)
MCV: 98.7 fL (ref 80.0–100.0)
Monocytes Absolute: 0.3 10*3/uL (ref 0.1–1.0)
Monocytes Relative: 4 %
Neutro Abs: 6.6 10*3/uL (ref 1.7–7.7)
Neutrophils Relative %: 90 %
Platelet Count: 155 10*3/uL (ref 150–400)
RBC: 3.99 MIL/uL — ABNORMAL LOW (ref 4.22–5.81)
RDW: 17.2 % — ABNORMAL HIGH (ref 11.5–15.5)
WBC Count: 7.4 10*3/uL (ref 4.0–10.5)
nRBC: 0 % (ref 0.0–0.2)

## 2024-03-29 MED ORDER — OXALIPLATIN CHEMO INJECTION 100 MG/20ML
85.0000 mg/m2 | Freq: Once | INTRAVENOUS | Status: AC
  Administered 2024-03-29: 170 mg via INTRAVENOUS
  Filled 2024-03-29: qty 27.49

## 2024-03-29 MED ORDER — DEXAMETHASONE SOD PHOSPHATE PF 10 MG/ML IJ SOLN
10.0000 mg | Freq: Once | INTRAMUSCULAR | Status: AC
  Administered 2024-03-29: 10 mg via INTRAVENOUS
  Filled 2024-03-29: qty 1

## 2024-03-29 MED ORDER — PALONOSETRON HCL INJECTION 0.25 MG/5ML
0.2500 mg | Freq: Once | INTRAVENOUS | Status: AC
  Administered 2024-03-29: 0.25 mg via INTRAVENOUS
  Filled 2024-03-29: qty 5

## 2024-03-29 MED ORDER — FLUOROURACIL CHEMO INJECTION 2.5 GM/50ML
400.0000 mg/m2 | Freq: Once | INTRAVENOUS | Status: AC
  Administered 2024-03-29: 800 mg via INTRAVENOUS
  Filled 2024-03-29: qty 16

## 2024-03-29 MED ORDER — DEXTROSE 5 % IV SOLN: INTRAVENOUS | Status: DC

## 2024-03-29 MED ORDER — LEUCOVORIN CALCIUM INJECTION 350 MG
400.0000 mg/m2 | Freq: Once | INTRAVENOUS | Status: AC
  Administered 2024-03-29: 808 mg via INTRAVENOUS
  Filled 2024-03-29: qty 25

## 2024-03-29 NOTE — Progress Notes (Signed)
 "  [facility]  ONCOLOGY CLINIC PROGRESS NOTE   Patient Care Team: [name], MD as PCP - General (Family Medicine) Avram Lupita BRAVO, MD as Attending Physician (Gastroenterology) Harvey Carlin BRAVO, MD (Inactive) as Attending Physician (Vascular Surgery) Ardis Evalene CROME, RN as Oncology Nurse Navigator Siboney Requejo, Chinita, MD as Consulting Physician (Hematology and Oncology)  PATIENT NAME: [name]   MR#: [id number] DOB: Sep 16, 1965  Date of visit: 03/29/2024   ASSESSMENT & PLAN:   [name] is a 59 y.o.  gentleman with a past medical history of primary sclerosing cholangitis, autoimmune hepatitis, Crohn's disease, anxiety/depression, PTSD, past C. difficile colitis, chronic pain syndrome, chronic mesenteric ischemia, basal cell carcinoma of left lower extremity, history of suicide attempt, past portal vein thrombosis, was referred to our clinic in June 2025 for newly diagnosed intrahepatic cholangiocarcinoma.  cT2, cN0, cM1.  Intrahepatic cholangiocarcinoma ([name]) Please review oncology history for additional details and timeline of events.    Intrahepatic cholangiocarcinoma, diagnosed following MRI and liver biopsy in May 2025.  He underwent ultrasound-guided biopsy of liver lesion on 07/29/2023.  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 CDX-2 positive, CK20 positive, CK7 positive, synaptophysin negative, chromogranin negative.  IHC profile suggested a gastrointestinal or pancreaticobiliary primary.  Clinical picture is consistent with intrahepatic cholangiocarcinoma, primarily located in the right lobe of the liver with disease limited to the liver area. A new small lesion on the dome of the left side of the liver noted on PET scan and also recent CT scan.  Too small to be biopsied.  Plan made to monitor this on future scans.   His case was previously discussed in GI  multidisciplinary tumor conference. 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  Given history of autoimmune hepatitis, primary sclerosing cholangitis, he would not be a candidate for immunotherapy.  Hence plan would be to proceed with chemotherapy using cisplatin  and gemcitabine .  Following drainage of pelvic/anal abscess, once we have obtained clearance from the surgery department, and once his C. difficile was cleared, he was started on systemic chemotherapy with cisplatin  and gemcitabine  from 09/28/2023.  Plan was to continue once weekly for two weeks on, one week off, depending on tolerance.   Following completion of 3 cycles of chemotherapy, restaging MRI of the abdomen on 12/07/2023 showed progressive disease with diffuse mets in the liver.  Discussed results with the patient in detail previously.  Clinically he seemed to have improved compared to prior.  This was an unfortunate development.  Reviewed NCCN guidelines and discussed treatment options.  Proposed switching treatments to FOLFOX.  Discussed side effect profile including diarrhea, hand-foot syndrome with 5-FU and cold sensitivity, progressive neuropathy with oxaliplatin  in addition to other side effects.  Patient verbalized understanding and gave consent to proceed with this treatment option.  Proceeded with cycle 1 of FOLFOX from 12/14/2023.  Plan is to continue this every 2 weeks with restaging imaging in approximately 3 months from the time we switched treatments.  Tolerated cycle 1 of FOLFOX reasonably well.  He did develop a rash on his face, which he had previously also.  Recommended avoiding sun exposure, aloe vera cream with topical cortisone.  He did not experience rash after cycle 2.  Cycle 3 was deferred until 02/01/24 as patient had surgery for fistula repair that is related to his Crohn's on 01/13/2024.  Recovered well from  surgical  procedure.  Following 4 cycles of FOLFOX, restaging MRI of the liver on 03/06/2024 showed mixed response with overall stable disease.  He is tolerating current regimen well, with only minor facial rash and no significant cytopenias or gastrointestinal toxicity. Blood counts are stable. He is not eligible for immunotherapy due to autoimmune hepatitis and primary sclerosing cholangitis.   Plan to continue FOLFOX for now.  He was also referred to [facility], Dr. Debora for consideration of liver directed therapy vs HAI pump therapy consideration. The presence of primary sclerosing cholangitis may complicate eligibility for certain liver-directed interventions.  Labs today show no dose-limiting toxicities.  Due for cycle 6 of FOLFOX today.  Patient is concerned about receiving 5-FU pump with current cycle, since there is inclement weather expected on the day of 5-FU pump removal.  Hence we will proceed with 5-FU bolus, oxaliplatin  only today and skip 5-FU pump.  - Monitor blood counts, liver function, and for side effects such as nausea, fatigue, hand-foot syndrome, diarrhea and neuropathy. - Advise on precautions with cold sensitivity: eat and drink at room temperature, wear gloves when handling cold items.  - Monitor CA 19-9 and CEA levels periodically.  Labs were drawn today.  Results pending.  RTC in 2 weeks for cycle 7 of FOLFOX.    - Communicated with treatment room staff regarding pump issue and plan adjustment.   Primary sclerosing cholangitis Primary sclerosing cholangitis may impact eligibility for liver-directed therapies for intrahepatic cholangiocarcinoma. This comorbidity was considered in the referral process. - Included primary sclerosing cholangitis in referral to [facility] for specialist input regarding liver-directed therapies.  History of Clostridioides difficile infection Clostridioides difficile infection has resolved.   I reviewed lab  results and outside records for this visit and discussed relevant results with the patient. Diagnosis, plan of care and treatment options were also discussed in detail with the patient. Opportunity provided to ask questions and answers provided to his apparent satisfaction. Provided instructions to call our clinic with any problems, questions or concerns prior to return visit. I recommended to continue follow-up with PCP and sub-specialists. He verbalized understanding and agreed with the plan.   NCCN guidelines have been consulted in the planning of this patients care.  I spent a total of 42 minutes during this encounter with the patient including review of chart and various tests results, discussions about plan of care and coordination of care plan.   Chinita Patten, MD  03/29/2024  1:05 PM  [facility] - A DEPT OF [facility] [address] [place] [address] Dept: [phone] Dept Fax: [phone]    CHIEF COMPLAINT/ REASON FOR VISIT:   Intrahepatic cholangiocarcinoma, diagnosed in June 2025.  Current Treatment: Plan made for systemic treatment with cisplatin  and gemcitabine , followed by surgical evaluation.  Started on systemic chemotherapy with cisplatin  and gemcitabine  from 09/28/2023.  Immunotherapy avoided because of his history of autoimmune hepatitis and primary sclerosing cholangitis.  Following completion of 3 cycles of chemotherapy, restaging MRI of the abdomen on 12/07/2023 showed progressive disease with diffuse mets in the liver.  Treatment switched to FOLFOX from 12/14/2023.  INTERVAL HISTORY:    Discussed the use of AI scribe software for clinical note transcription with the patient, who gave verbal consent to proceed.  History of Present Illness  Chaseton Yepiz is a 59 year old male with stage IV intrahepatic cholangiocarcinoma with diffuse hepatic metastases who presents for  routine  oncology follow-up and chemotherapy administration.  He is currently receiving systemic FOLFOX chemotherapy for metastatic intrahepatic cholangiocarcinoma. He has tolerated treatment well, without nausea, vomiting, diarrhea, abdominal pain, or weight loss. He maintains adequate oral intake and has not experienced constitutional symptoms such as fever, chills, or night sweats.  Laboratory evaluation today demonstrates stable blood counts, including hemoglobin of 13.1 g/dL and white blood cell count of 7,400/L. He has not required hematopoietic growth factor support since the initial cycle of chemotherapy.  He is in the process of establishing care with [facility] for evaluation of liver-directed therapy or hepatic arterial infusion (HAI) pump therapy. His account and paperwork are being processed, and he is awaiting scheduling of his initial appointment.  Sep 28, 2023: Initiation of systemic chemotherapy with cisplatin  and gemcitabine  for newly diagnosed intrahepatic cholangiocarcinoma; patient evaluated for treatment tolerance and baseline labs obtained. Dec 07, 2023: Oncology follow-up for restaging MRI, which showed progressive liver metastases while on cisplatin  and gemcitabine ; treatment plan discussed. Dec 14, 2023: Switch to FOLFOX chemotherapy regimen due to progression on prior therapy; assessment of eligibility and baseline labs prior to new regimen. Mar 06, 2024: Restaging MRI after four cycles of FOLFOX showed mixed response with overall stable disease; patient tolerating FOLFOX well with minor facial rash, no dose-limiting toxicities, and remains asymptomatic from malignancy. Mar 15, 2024: Oncology follow-up for ongoing systemic therapy; review of recent imaging and labs, continued FOLFOX planned, referral for liver-directed therapy, patient clinically stable and asymptomatic from cancer.   I have reviewed the past medical history, past surgical history, social history and family  history with the patient and they are unchanged from previous note.  HISTORY OF PRESENT ILLNESS:   ONCOLOGY HISTORY:   He has a history of autoimmune hepatitis, primary sclerosing cholangitis diagnosed in 2012, and Crohn's disease for which he is currently taking prednisone  and Entyvio .   He was admitted to the hospital on 07/02/2023 after he presented with complaints of multiple episodes of diarrhea, dizziness, near syncope.  CT scan of the abdomen and pelvis on 07/02/2023 showed new 3.7 x 2.8 cm noncystic lesion in the dome of the liver, which was not present on prior studies.  Given history of primary sclerosing cholangitis, there was concern for cholangiocarcinoma and MRI was recommended.   On 07/03/2023, MRI of the abdomen showed 4.3 x 2.8 x 3.7 cm lesion in the lateral aspect of the hepatic dome which showed heterogeneous enhancement after IV contrast administration, suspicious for neoplasm.  Cholangiocarcinoma was considered because of his history of primary sclerosing cholangitis.  Inferior and posterior to this lesion was a cyst measuring 5 x 4.5 cm, slightly increased in size compared to previous MRI.  Bile duct showed variable dilatation and stenosis.   Patient did not want to stay in the hospital for biopsy at that time.   He underwent ultrasound-guided biopsy of liver lesion on 07/29/2023.  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 CDX-2 positive, CK20 positive, CK7 positive, synaptophysin negative, chromogranin negative.  IHC profile suggested a gastrointestinal or pancreaticobiliary primary.   To look for a primary, patient underwent upper endoscopy and colonoscopy on 08/18/2023, by Dr. Avram.  EGD showed evidence of gastritis, single gastric polyp.  Otherwise unremarkable.  Colonoscopy showed several polyps in the transverse colon, sigmoid colon, descending colon.  Evidence of diverticulosis in the sigmoid colon.   Multiple biopsies were obtained.  Biopsies from the stomach, colon, cecum all showed benign findings.  No evidence  of malignancy noted.   Clinical picture is consistent with intrahepatic cholangiocarcinoma.   Given history of autoimmune hepatitis, primary sclerosing cholangitis, he would not be a candidate for immunotherapy.  Hence plan would be to proceed with chemotherapy using cisplatin  and gemcitabine .   Request submitted for NGS testing on the specimen and liquid biopsy.  Liquid biopsy showed no actionable mutations.   On 09/09/2023, staging PET scan showed marginal areas of uptake along the mass lesion in the posterior superior right hepatic lobe, corresponding to the area of known neoplasm.  There were small cystic areas elsewhere in the liver, new small low-density focus in the dome of left hepatic lobe.  No appreciable abnormal uptake.  No areas of abnormal uptake seen above the diaphragm.  Findings consistent with anal fistula noted.  His case was previously discussed in GI multidisciplinary tumor conference.  Dr. Dasie felt that he can be a surgical candidate following neoadjuvant chemotherapy.  Following drainage of pelvic/anal abscess, once we have obtained clearance from the surgery department, and once his C. difficile was cleared, he was started on systemic chemotherapy with cisplatin  and gemcitabine  from 09/28/2023.  Plan made to continue once weekly for two weeks on, one week off, depending on tolerance.   Following completion of 3 cycles of chemotherapy, restaging MRI of the abdomen on 12/07/2023 showed progressive disease with diffuse mets in the liver.  Treatment switched to FOLFOX from 12/14/2023.  Following 4 cycles of FOLFOX, restaging MRI of the liver on 03/06/2024 showed mixed response with overall stable disease.  Plan to continue FOLFOX for now.  He was also referred to [facility], Dr. Debora for consideration of liver directed therapy vs HAI pump therapy  consideration.    Oncology History  Intrahepatic cholangiocarcinoma ([name])  08/18/2023 Initial Diagnosis   Cholangiocarcinoma metastatic to liver ([name])   08/26/2023 Cancer Staging   Staging form: Intrahepatic Bile Duct, AJCC 8th Edition - Clinical: Stage IV (cT2, cN0, cM1) - Signed by Autumn Millman, MD on 12/14/2023   09/28/2023 - 11/30/2023 Chemotherapy   Patient is on Treatment Plan : BILIARY TRACT Cisplatin  + Gemcitabine  D1,8 q21d     12/14/2023 -  Chemotherapy   Patient is on Treatment Plan : FOLFOX q14d         REVIEW OF SYSTEMS:   Review of Systems - Oncology  All other pertinent systems were reviewed with the patient and are negative.  ALLERGIES: He is allergic to tape, asacol  [mesalamine ], azathioprine, gluten meal, metoprolol  tartrate, mycophenolate mofetil, other, ciprofloxacin , keflex [cephalexin], abilify  [aripiprazole ], and amoxicillin .  MEDICATIONS:  Current Outpatient Medications  Medication Sig Dispense Refill   Cholecalciferol 125 MCG (5000 UT) TABS Take 1 tablet by mouth daily.     ferrous sulfate  325 (65 FE) MG tablet Take 325 mg by mouth daily with breakfast. Continues to take per low HGB     hydrOXYzine  (ATARAX ) 25 MG tablet Take 1 tablet (25 mg total) by mouth 3 (three) times daily as needed for anxiety. 30 tablet 0   lidocaine -prilocaine  (EMLA ) cream Apply to affected area once (Patient not taking: Reported on 03/15/2024) 30 g 3   Magnesium  125 MG CAPS One at bedtime (Patient not taking: Reported on 03/15/2024)     melatonin 3 MG TABS tablet Take 3 mg by mouth at bedtime.     metroNIDAZOLE  (FLAGYL ) 500 MG tablet Take 500 mg by mouth 2 (two) times daily.     mirtazapine  (REMERON ) 30 MG tablet Take 0.5 tablets (15 mg total)  by mouth at bedtime. (Patient taking differently: Take 30 mg by mouth at bedtime.)     Nutritional Supplements (ENSURE HIGH PROTEIN) LIQD Take 237 mLs by mouth in the morning and at bedtime.     ondansetron  (ZOFRAN ) 8 MG tablet Take 1 tablet  (8 mg total) by mouth every 8 (eight) hours as needed for nausea or vomiting. Start on the third day after chemotherapy. 30 tablet 1   Potassium 99 MG TABS Take 1 tablet by mouth daily. (Patient taking differently: Take 1 tablet by mouth daily. Patient reported he takes this once a week.)     predniSONE  (DELTASONE ) 20 MG tablet Take 1 tablet (20 mg total) by mouth daily with breakfast. 90 tablet 1   prochlorperazine  (COMPAZINE ) 10 MG tablet Take 1 tablet (10 mg total) by mouth every 6 (six) hours as needed for nausea or vomiting. (Patient not taking: Reported on 03/15/2024) 30 tablet 1   Vedolizumab  (ENTYVIO  IV) Inject into the vein every 6 (six) weeks.     venlafaxine  XR (EFFEXOR -XR) 150 MG 24 hr capsule Take 1 capsule (150 mg total) by mouth daily with breakfast. 30 capsule 0   venlafaxine  XR (EFFEXOR -XR) 75 MG 24 hr capsule Take 75 mg by mouth daily.     No current facility-administered medications for this visit.   Facility-Administered Medications Ordered in Other Visits  Medication Dose Route Frequency Provider Last Rate Last Admin   dextrose  5 % solution   Intravenous Continuous Bettyjo Lundblad, MD 10 mL/hr at 03/29/24 1241 New Bag at 03/29/24 1241   fluorouracil  (ADRUCIL ) chemo injection 800 mg  400 mg/m2 (Treatment Plan Recorded) Intravenous Once Deakin Lacek, MD       leucovorin  808 mg in dextrose  5 % 250 mL infusion  400 mg/m2 (Treatment Plan Recorded) Intravenous Once Veverly Larimer, MD       oxaliplatin  (ELOXATIN ) 170 mg in dextrose  5 % 500 mL chemo infusion  85 mg/m2 (Treatment Plan Recorded) Intravenous Once Malala Trenkamp, MD         VITALS:   Blood pressure (!) 156/91, pulse 87, temperature 97.9 F (36.6 C), temperature source Oral, resp. rate 18, height 6' 1.5 (1.867 m), weight 196 lb 9.6 oz (89.2 kg), SpO2 97%.  Wt Readings from Last 3 Encounters:  03/29/24 196 lb 9.6 oz (89.2 kg)  03/15/24 193 lb 14.4 oz (88 kg)  02/29/24 186 lb 12 oz (84.7 kg)    Body mass index is  25.59 kg/m.    Onc Performance Status - 03/29/24 1300       ECOG Perf Status   ECOG Perf Status Restricted in physically strenuous activity but ambulatory and able to carry out work of a light or sedentary nature, e.g., light house work, office work      KPS SCALE   KPS % SCORE Able to carry on normal activity, minor s/s of disease           PHYSICAL EXAM:   Physical Exam Constitutional:      General: He is not in acute distress.    Appearance: Normal appearance.  HENT:     Head: Normocephalic and atraumatic.  Eyes:     Conjunctiva/sclera: Conjunctivae normal.  Cardiovascular:     Rate and Rhythm: Normal rate and regular rhythm.     Heart sounds: Normal heart sounds.  Pulmonary:     Effort: Pulmonary effort is normal. No respiratory distress.     Breath sounds: Normal breath sounds.  Chest:  Comments: Port-A-Cath in place without any signs of infection Abdominal:     General: There is no distension.  Skin:    Findings: Rash: Resolved.  Neurological:     General: No focal deficit present.     Mental Status: He is alert and oriented to person, place, and time.  Psychiatric:        Mood and Affect: Mood normal.        Behavior: Behavior normal.       LABORATORY DATA:   I have reviewed the data as listed.  Results for orders placed or performed in visit on 03/29/24  CMP (Cancer Center only)  Result Value Ref Range   Sodium 138 135 - 145 mmol/L   Potassium 4.4 3.5 - 5.1 mmol/L   Chloride 103 98 - 111 mmol/L   CO2 24 22 - 32 mmol/L   Glucose, Bld 142 (H) 70 - 99 mg/dL   BUN 25 (H) 6 - 20 mg/dL   Creatinine 8.85 9.38 - 1.24 mg/dL   Calcium  8.8 (L) 8.9 - 10.3 mg/dL   Total Protein 6.2 (L) 6.5 - 8.1 g/dL   Albumin 3.7 3.5 - 5.0 g/dL   AST 68 (H) 15 - 41 U/L   ALT 91 (H) 0 - 44 U/L   Alkaline Phosphatase 153 (H) 38 - 126 U/L   Total Bilirubin 0.4 0.0 - 1.2 mg/dL   GFR, Estimated >39 >39 mL/min   Anion gap 12 5 - 15  CBC with Differential (Cancer  Center Only)  Result Value Ref Range   WBC Count 7.4 4.0 - 10.5 K/uL   RBC 3.99 (L) 4.22 - 5.81 MIL/uL   Hemoglobin 13.1 13.0 - 17.0 g/dL   HCT 60.5 60.9 - 47.9 %   MCV 98.7 80.0 - 100.0 fL   MCH 32.8 26.0 - 34.0 pg   MCHC 33.2 30.0 - 36.0 g/dL   RDW 82.7 (H) 88.4 - 84.4 %   Platelet Count 155 150 - 400 K/uL   nRBC 0.0 0.0 - 0.2 %   Neutrophils Relative % 90 %   Neutro Abs 6.6 1.7 - 7.7 K/uL   Lymphocytes Relative 5 %   Lymphs Abs 0.4 (L) 0.7 - 4.0 K/uL   Monocytes Relative 4 %   Monocytes Absolute 0.3 0.1 - 1.0 K/uL   Eosinophils Relative 0 %   Eosinophils Absolute 0.0 0.0 - 0.5 K/uL   Basophils Relative 0 %   Basophils Absolute 0.0 0.0 - 0.1 K/uL   Immature Granulocytes 1 %   Abs Immature Granulocytes 0.09 (H) 0.00 - 0.07 K/uL       RADIOGRAPHIC STUDIES:  MR LIVER W WO CONTRAST CLINICAL DATA:  Restaging intrahepatic cholangiocarcinoma  EXAM: MRI ABDOMEN WITHOUT AND WITH CONTRAST  TECHNIQUE: Multiplanar multisequence MR imaging of the abdomen was performed both before and after the administration of intravenous contrast.  CONTRAST:  8mL GADAVIST  GADOBUTROL  1 MMOL/ML IV SOLN  COMPARISON:  12/07/2023  FINDINGS: Lower chest: No acute abnormality.  Hepatobiliary: Numerous hypoenhancing liver lesions again seen. Dominant lesion in the liver dome is perhaps slightly diminished in size, measuring 5.5 x 4.1 cm, previously 5.9 x 4.3 cm. The remaining lesions are however somewhat increased in size, for example in the anterior left lobe of the liver measuring 2.6 x 2.2 cm, previously 1.9 x 1.5 cm (series 21, image 12). No gallstones, gallbladder wall thickening, or biliary dilatation.  Pancreas: Unremarkable. No pancreatic ductal dilatation or surrounding inflammatory changes.  Spleen: Normal in  size without significant abnormality.  Adrenals/Urinary Tract: Adrenal glands are unremarkable. Kidneys are normal, without renal calculi, solid lesion, or  hydronephrosis.  Stomach/Bowel: Stomach is within normal limits. No evidence of bowel wall thickening, distention, or inflammatory changes.  Vascular/Lymphatic: Aortic atherosclerosis. No enlarged abdominal lymph nodes.  Other: No abdominal wall hernia or abnormality. No ascites.  Musculoskeletal: No acute or significant osseous findings.  IMPRESSION: 1. Numerous hypoenhancing liver lesions again seen. Dominant lesion in the liver dome is perhaps slightly diminished in size, measuring 5.5 x 4.1 cm, previously 5.9 x 4.3 cm. The remaining lesions are however somewhat increased in size, for example in the anterior left lobe of the liver measuring 2.6 x 2.2 cm, previously 1.9 x 1.5 cm. Findings consistent with worsened cholangiocarcinoma and intrahepatic metastatic disease. 2. No other evidence of metastatic disease in the abdomen.  Electronically Signed   By: Marolyn JONETTA Jaksch M.D.   On: 03/08/2024 09:09    CODE STATUS:  Code Status History     Date Active Date Inactive Code Status Order ID Comments User Context   09/14/2023 1143 09/15/2023 0511 Full Code [id number]  Johann Sieving, MD HOV   08/20/2023 1401 08/23/2023 1406 Full Code [id number]  Lou Claretta HERO, MD ED   07/29/2023 1419 07/30/2023 0518 Full Code [id number]  Johann Sieving, MD HOV   07/02/2023 1533 07/05/2023 2312 Full Code [id number]  Celinda Alm Lot, MD ED   04/13/2023 1949 05/20/2023 1942 Full Code [id number]  Motley-Mangrum, Cathaleen LABOR, PMHNP Inpatient   03/28/2023 2234 04/04/2023 1348 Full Code [id number]  Alan Thurman GAILS, NP Inpatient   03/28/2023 1136 03/28/2023 2111 Full Code [id number]  Minnie Tinnie BRAVO, PA ED   08/26/2018 1234 09/01/2018 2105 Full Code [id number]  Arloa Chroman, PA-C ED   08/25/2018 1240 08/25/2018 2000 Full Code [id number]  Little, Vernell Search, MD ED   07/31/2018 1406 08/01/2018 1446 Full Code [id number]  Zada Elouise BROCKS, PA-C ED   07/19/2018 2045 07/21/2018 1739 DNR [id number]  [place], Eric J, DO Inpatient    05/28/2018 1506 06/01/2018 1222 DNR [id number]  Barbarann Nest, MD Inpatient   05/20/2012 1059 05/25/2012 1606 DNR [id number]  Cherisse Ronal ORN, NP Inpatient   05/18/2012 1723 05/20/2012 1059 Full Code [id number]  Juvenal Harlene PENNER, DO Inpatient    Questions for Most Recent Historical Code Status (Order [id number])     Question Answer   By: Other            No orders of the defined types were placed in this encounter.    Future Appointments  Date Time Provider Department Center  03/31/2024  2:00 PM [facility] None  05/15/2024  3:30 PM Avram Lupita BRAVO, MD [facility]     This document was completed utilizing speech recognition software. Grammatical errors, random word insertions, pronoun errors, and incomplete sentences are an occasional consequence of this system due to software limitations, ambient noise, and hardware issues. Any formal questions or concerns about the content, text or information contained within the body of this dictation should be directly addressed to the provider for clarification.   "

## 2024-03-29 NOTE — Patient Instructions (Signed)
 [facility] - A DEPT OF [place]  Discharge Instructions: Thank you for choosing [facility] to provide your oncology and hematology care.   If you have a lab appointment with the Cancer Center, please go directly to the Cancer Center and check in at the registration area.   Wear comfortable clothing and clothing appropriate for easy access to any Portacath or PICC line.   We strive to give you quality time with your provider. You may need to reschedule your appointment if you arrive late (15 or more minutes).  Arriving late affects you and other patients whose appointments are after yours.  Also, if you miss three or more appointments without notifying the office, you may be dismissed from the clinic at the providers discretion.      For prescription refill requests, have your pharmacy contact our office and allow 72 hours for refills to be completed.    Today you received the following chemotherapy and/or immunotherapy agents: Oxaliplatin  (Eloxatin ), Leucovorin , Fluorouracil  (Adrucil , 5-FU)      To help prevent nausea and vomiting after your treatment, we encourage you to take your nausea medication as directed.  BELOW ARE SYMPTOMS THAT SHOULD BE REPORTED IMMEDIATELY: *FEVER GREATER THAN 100.4 F (38 C) OR HIGHER *CHILLS OR SWEATING *NAUSEA AND VOMITING THAT IS NOT CONTROLLED WITH YOUR NAUSEA MEDICATION *UNUSUAL SHORTNESS OF BREATH *UNUSUAL BRUISING OR BLEEDING *URINARY PROBLEMS (pain or burning when urinating, or frequent urination) *BOWEL PROBLEMS (unusual diarrhea, constipation, pain near the anus) TENDERNESS IN MOUTH AND THROAT WITH OR WITHOUT PRESENCE OF ULCERS (sore throat, sores in mouth, or a toothache) UNUSUAL RASH, SWELLING OR PAIN  UNUSUAL VAGINAL DISCHARGE OR ITCHING   Items with * indicate a potential emergency and should be followed up as soon as possible or go to the Emergency Department if any problems should  occur.  Please show the CHEMOTHERAPY ALERT CARD or IMMUNOTHERAPY ALERT CARD at check-in to the Emergency Department and triage nurse.  Should you have questions after your visit or need to cancel or reschedule your appointment, please contact [facility] - A DEPT OF [facility]  Dept: [phone]  and follow the prompts.  Office hours are 8:00 a.m. to 4:30 p.m. Monday - Friday. Please note that voicemails left after 4:00 p.m. may not be returned until the following business day.  We are closed weekends and major holidays. You have access to a nurse at all times for urgent questions. Please call the main number to the clinic Dept: [phone] and follow the prompts.   For any non-urgent questions, you may also contact your provider using MyChart. We now offer e-Visits for anyone 38 and older to request care online for non-urgent symptoms. For details visit [email or web address].   Also download the MyChart app! Go to the app store, search MyChart, open the app, select [facility], and log in with your MyChart username and password.

## 2024-03-29 NOTE — Assessment & Plan Note (Signed)
 Please review oncology history for additional details and timeline of events.    Intrahepatic cholangiocarcinoma, diagnosed following MRI and liver biopsy in May 2025.  He underwent ultrasound-guided biopsy of liver lesion on 07/29/2023.  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 CDX-2 positive, CK20 positive, CK7 positive, synaptophysin negative, chromogranin negative.  IHC profile suggested a gastrointestinal or pancreaticobiliary primary.  Clinical picture is consistent with intrahepatic cholangiocarcinoma, primarily located in the right lobe of the liver with disease limited to the liver area. A new small lesion on the dome of the left side of the liver noted on PET scan and also recent CT scan.  Too small to be biopsied.  Plan made to monitor this on future scans.   His case was previously discussed in GI multidisciplinary tumor conference. 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  Given history of autoimmune hepatitis, primary sclerosing cholangitis, he would not be a candidate for immunotherapy.  Hence plan would be to proceed with chemotherapy using cisplatin  and gemcitabine .  Following drainage of pelvic/anal abscess, once we have obtained clearance from the surgery department, and once his C. difficile was cleared, he was started on systemic chemotherapy with cisplatin  and gemcitabine  from 09/28/2023.  Plan was to continue once weekly for two weeks on, one week off, depending on tolerance.   Following completion of 3 cycles of chemotherapy, restaging MRI of the abdomen on 12/07/2023 showed progressive disease with diffuse mets in the liver.  Discussed results with the patient in detail previously.  Clinically he seemed to have improved compared to prior.  This was an unfortunate development.  Reviewed [facility] guidelines  and discussed treatment options.  Proposed switching treatments to FOLFOX.  Discussed side effect profile including diarrhea, hand-foot syndrome with 5-FU and cold sensitivity, progressive neuropathy with oxaliplatin  in addition to other side effects.  Patient verbalized understanding and gave consent to proceed with this treatment option.  Proceeded with cycle 1 of FOLFOX from 12/14/2023.  Plan is to continue this every 2 weeks with restaging imaging in approximately 3 months from the time we switched treatments.  Tolerated cycle 1 of FOLFOX reasonably well.  He did develop a rash on his face, which he had previously also.  Recommended avoiding sun exposure, aloe vera cream with topical cortisone.  He did not experience rash after cycle 2.  Cycle 3 was deferred until 02/01/24 as patient had surgery for fistula repair that is related to his Crohn's on 01/13/2024.  Recovered well from surgical procedure.  Following 4 cycles of FOLFOX, restaging MRI of the liver on 03/06/2024 showed mixed response with overall stable disease.  He is tolerating current regimen well, with only minor facial rash and no significant cytopenias or gastrointestinal toxicity. Blood counts are stable. He is not eligible for immunotherapy due to autoimmune hepatitis and primary sclerosing cholangitis.   Plan to continue FOLFOX for now.  He was also referred to [facility], Dr. Debora for consideration of liver directed therapy vs HAI pump therapy consideration. The presence of primary sclerosing cholangitis may complicate eligibility for certain liver-directed interventions.  Labs today show no dose-limiting toxicities.  Due for cycle 6 of FOLFOX today.  Patient is concerned about receiving 5-FU pump with current cycle, since there is inclement weather expected on the day of 5-FU pump removal.  Hence we will proceed with 5-FU bolus, oxaliplatin  only today and skip 5-FU pump.  -  Monitor blood counts, liver function, and  for side effects such as nausea, fatigue, hand-foot syndrome, diarrhea and neuropathy. - Advise on precautions with cold sensitivity: eat and drink at room temperature, wear gloves when handling cold items.  - Monitor CA 19-9 and CEA levels periodically.  Labs were drawn today.  Results pending.  RTC in 2 weeks for cycle 7 of FOLFOX.    - Communicated with treatment room staff regarding pump issue and plan adjustment.

## 2024-03-30 ENCOUNTER — Encounter: Payer: Self-pay | Admitting: Oncology

## 2024-03-30 LAB — CANCER ANTIGEN 19-9: CA 19-9: 3502 U/mL — ABNORMAL HIGH (ref 0–35)

## 2024-03-31 ENCOUNTER — Inpatient Hospital Stay

## 2024-04-11 ENCOUNTER — Inpatient Hospital Stay: Admitting: Oncology

## 2024-04-11 ENCOUNTER — Inpatient Hospital Stay

## 2024-04-11 ENCOUNTER — Inpatient Hospital Stay: Attending: Oncology

## 2024-04-13 ENCOUNTER — Inpatient Hospital Stay

## 2024-04-26 ENCOUNTER — Inpatient Hospital Stay: Admitting: Oncology

## 2024-04-26 ENCOUNTER — Inpatient Hospital Stay

## 2024-04-28 ENCOUNTER — Inpatient Hospital Stay

## 2024-05-15 ENCOUNTER — Ambulatory Visit: Admitting: Internal Medicine
